# Patient Record
Sex: Female | Born: 1995
Health system: Southern US, Community
[De-identification: ages and names within clinical notes are randomized; demographics above are authoritative.]

## PROBLEM LIST (undated history)

## (undated) DIAGNOSIS — E876 Hypokalemia: Secondary | ICD-10-CM

## (undated) DIAGNOSIS — Z9114 Patient's other noncompliance with medication regimen: Secondary | ICD-10-CM

## (undated) DIAGNOSIS — D649 Anemia, unspecified: Secondary | ICD-10-CM

## (undated) DIAGNOSIS — F3181 Bipolar II disorder: Secondary | ICD-10-CM

## (undated) DIAGNOSIS — I509 Heart failure, unspecified: Secondary | ICD-10-CM

## (undated) DIAGNOSIS — E109 Type 1 diabetes mellitus without complications: Secondary | ICD-10-CM

## (undated) DIAGNOSIS — Z91148 Patient's other noncompliance with medication regimen for other reason: Secondary | ICD-10-CM

## (undated) DIAGNOSIS — N182 Chronic kidney disease, stage 2 (mild): Secondary | ICD-10-CM

## (undated) DIAGNOSIS — I5022 Chronic systolic (congestive) heart failure: Secondary | ICD-10-CM

## (undated) DIAGNOSIS — N189 Chronic kidney disease, unspecified: Secondary | ICD-10-CM

## (undated) DIAGNOSIS — N186 End stage renal disease: Secondary | ICD-10-CM

## (undated) DIAGNOSIS — G43909 Migraine, unspecified, not intractable, without status migrainosus: Secondary | ICD-10-CM

## (undated) DIAGNOSIS — E1110 Type 2 diabetes mellitus with ketoacidosis without coma: Secondary | ICD-10-CM

## (undated) DIAGNOSIS — O1490 Unspecified pre-eclampsia, unspecified trimester: Secondary | ICD-10-CM

## (undated) DIAGNOSIS — F419 Anxiety disorder, unspecified: Secondary | ICD-10-CM

## (undated) DIAGNOSIS — F32A Depression, unspecified: Secondary | ICD-10-CM

## (undated) DIAGNOSIS — I4581 Long QT syndrome: Secondary | ICD-10-CM

## (undated) DIAGNOSIS — E119 Type 2 diabetes mellitus without complications: Secondary | ICD-10-CM

## (undated) DIAGNOSIS — M62830 Muscle spasm of back: Secondary | ICD-10-CM

## (undated) DIAGNOSIS — B009 Herpesviral infection, unspecified: Secondary | ICD-10-CM

## (undated) DIAGNOSIS — N183 Chronic kidney disease, stage 3 unspecified: Secondary | ICD-10-CM

## (undated) DIAGNOSIS — D72829 Elevated white blood cell count, unspecified: Secondary | ICD-10-CM

## (undated) DIAGNOSIS — Z992 Dependence on renal dialysis: Secondary | ICD-10-CM

## (undated) HISTORY — PX: CARDIAC CATHETERIZATION: SHX172

## (undated) HISTORY — DX: Anemia, unspecified: D64.9

## (undated) HISTORY — PX: NO PAST SURGERIES: SHX2092

## (undated) HISTORY — PX: RENAL BIOPSY: SHX156

## (undated) HISTORY — PX: OTHER SURGICAL HISTORY: SHX169

---
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
## 2015-12-15 ENCOUNTER — Encounter (HOSPITAL_COMMUNITY): Payer: Self-pay | Admitting: Emergency Medicine

## 2015-12-15 ENCOUNTER — Ambulatory Visit (INDEPENDENT_AMBULATORY_CARE_PROVIDER_SITE_OTHER)
Admission: EM | Admit: 2015-12-15 | Discharge: 2015-12-15 | Disposition: A | Discharge status: Nursing Home (Includes ICF) | Payer: Managed Care, Other (non HMO) | Source: Home / Self Care | Attending: Family Medicine | Admitting: Family Medicine

## 2015-12-15 ENCOUNTER — Emergency Department (HOSPITAL_COMMUNITY)
Admission: EM | Admit: 2015-12-15 | Discharge: 2015-12-15 | Disposition: A | Discharge status: Home / Self Care | Payer: Managed Care, Other (non HMO) | Attending: Emergency Medicine | Admitting: Emergency Medicine

## 2015-12-15 ENCOUNTER — Encounter (HOSPITAL_COMMUNITY): Payer: Self-pay

## 2015-12-15 DIAGNOSIS — Z794 Long term (current) use of insulin: Secondary | ICD-10-CM | POA: Diagnosis not present

## 2015-12-15 DIAGNOSIS — E860 Dehydration: Secondary | ICD-10-CM | POA: Diagnosis not present

## 2015-12-15 DIAGNOSIS — F17200 Nicotine dependence, unspecified, uncomplicated: Secondary | ICD-10-CM | POA: Diagnosis not present

## 2015-12-15 DIAGNOSIS — Z79899 Other long term (current) drug therapy: Secondary | ICD-10-CM | POA: Insufficient documentation

## 2015-12-15 DIAGNOSIS — R739 Hyperglycemia, unspecified: Secondary | ICD-10-CM

## 2015-12-15 DIAGNOSIS — E1065 Type 1 diabetes mellitus with hyperglycemia: Secondary | ICD-10-CM

## 2015-12-15 DIAGNOSIS — R824 Acetonuria: Secondary | ICD-10-CM | POA: Diagnosis not present

## 2015-12-15 DIAGNOSIS — E1165 Type 2 diabetes mellitus with hyperglycemia: Secondary | ICD-10-CM | POA: Insufficient documentation

## 2015-12-15 DIAGNOSIS — E119 Type 2 diabetes mellitus without complications: Secondary | ICD-10-CM | POA: Diagnosis present

## 2015-12-15 LAB — GLUCOSE, CAPILLARY: Glucose-Capillary: 486 mg/dL — ABNORMAL HIGH (ref 65–99)

## 2015-12-15 LAB — CBG MONITORING, ED: GLUCOSE-CAPILLARY: 320 mg/dL — AB (ref 65–99)

## 2015-12-15 LAB — POCT URINALYSIS DIP (DEVICE)
Bilirubin Urine: NEGATIVE
GLUCOSE, UA: 500 mg/dL — AB
KETONES UR: 15 mg/dL — AB
Leukocytes, UA: NEGATIVE
Nitrite: NEGATIVE
PROTEIN: NEGATIVE mg/dL
Specific Gravity, Urine: 1.015 (ref 1.005–1.030)
UROBILINOGEN UA: 0.2 mg/dL (ref 0.0–1.0)
pH: 5.5 (ref 5.0–8.0)

## 2015-12-15 LAB — BASIC METABOLIC PANEL
Anion gap: 12 (ref 5–15)
BUN: 14 mg/dL (ref 6–20)
CALCIUM: 9.2 mg/dL (ref 8.9–10.3)
CO2: 23 mmol/L (ref 22–32)
CREATININE: 0.88 mg/dL (ref 0.44–1.00)
Chloride: 101 mmol/L (ref 101–111)
GFR calc Af Amer: >60 mL/min (ref >60)
GFR calc non Af Amer: >60 mL/min (ref >60)
Glucose, Bld: 352 mg/dL — ABNORMAL HIGH (ref 65–99)
Potassium: 4.3 mmol/L (ref 3.5–5.1)
SODIUM: 136 mmol/L (ref 135–145)

## 2015-12-15 LAB — POCT I-STAT, CHEM 8
BUN: 18 mg/dL (ref 6–20)
Calcium, Ion: 1.21 mmol/L (ref 1.12–1.23)
Chloride: 97 mmol/L — ABNORMAL LOW (ref 101–111)
Creatinine, Ser: 0.8 mg/dL (ref 0.44–1.00)
Glucose, Bld: 451 mg/dL — ABNORMAL HIGH (ref 65–99)
HCT: 45 % (ref 36.0–46.0)
HEMOGLOBIN: 15.3 g/dL — AB (ref 12.0–15.0)
Potassium: 4.2 mmol/L (ref 3.5–5.1)
SODIUM: 135 mmol/L (ref 135–145)
TCO2: 25 mmol/L (ref 0–100)

## 2015-12-15 MED ORDER — INSULIN LISPRO 100 UNIT/ML ~~LOC~~ SOLN: 10.0000 [IU] | Freq: Three times a day (TID) | SUBCUTANEOUS | Status: DC

## 2015-12-15 MED ORDER — SODIUM CHLORIDE 0.9 % IV BOLUS (SEPSIS)
1000.0000 mL | Freq: Once | INTRAVENOUS | Status: AC
  Administered 2015-12-15: 1000 mL via INTRAVENOUS

## 2015-12-15 NOTE — ED Provider Notes (Signed)
CSN: [id number]     Arrival date & time 12/15/15  1641 History   First MD Initiated Contact with Patient 12/15/15 1731     Chief Complaint  Patient presents with  . Hyperglycemia   (Consider location/radiation/quality/duration/timing/severity/associated sxs/prior Treatment) HPI Comments: 20 year old female that is a type I diabetic states she started running low on her medications 5 days ago. Since that time her blood sugars have been elevating. She has had polyuria polydipsia, dry mouth, chest burning, lightheadedness. She states she tried to call her PCPis located 2 hours away and have her insulin filled locally. There has been some problem and having them send a prescription to a local pharmacy.She presents stating that her last blood sugar was over 500 and on arrival blood sugar is 486.   Past Medical History  Diagnosis Date  . Diabetes mellitus without complication ([place])    History reviewed. No pertinent past surgical history. No family history on file. Social History  Substance Use Topics  . Smoking status: Light Tobacco Smoker  . Smokeless tobacco: Never Used  . Alcohol Use: No   OB History    No data available     Review of Systems  Constitutional: Positive for activity change. Negative for fever.  HENT: Negative.   Respiratory: Positive for shortness of breath.   Gastrointestinal: Negative for nausea, vomiting and abdominal pain.  Endocrine: Positive for polydipsia and polyuria.  Genitourinary: Negative.   Musculoskeletal: Negative.   Skin: Negative.   Neurological: Positive for dizziness and light-headedness. Negative for tremors, syncope and speech difficulty.  Psychiatric/Behavioral: Negative.     Allergies  Review of patient's allergies indicates no known allergies.  Home Medications   Prior to Admission medications   Medication Sig Start Date End Date Taking? Authorizing Provider  insulin glargine (LANTUS) 100 UNIT/ML injection Inject 30 Units into the  skin at bedtime.    Yes Historical Provider, MD  insulin lispro (HUMALOG) 100 UNIT/ML injection Inject 10-15 Units into the skin 3 (three) times daily before meals. Take 10 units at breakfast, 15 units at lunch & 15 units at dinner. Additional Doses per Sliding Scale: If Blood Sugar is 200 above Take 1 unit, above 250 Take 2 units, above 300 Take 3 units, above 350 Take 4 units, above 400 Take 5 units & above 450 Take 6 units.   Yes Historical Provider, MD  fluconazole (DIFLUCAN) 100 MG tablet Take 1 tablet (100 mg total) by mouth daily. 12/23/14   Robbie Lis, MD  MedroxyPROGESTERone Acetate (DEPO-PROVERA IM) Inject 1 Units into the muscle every 3 (three) months.    Historical Provider, MD  metoCLOPramide (REGLAN) 5 MG tablet Take 1 tablet (5 mg total) by mouth every 6 (six) hours as needed for nausea, vomiting or refractory nausea / vomiting. 12/23/14   Robbie Lis, MD  ondansetron (ZOFRAN ODT) 4 MG disintegrating tablet Take 1 tablet (4 mg total) by mouth every 8 (eight) hours as needed for nausea. 12/23/14   Robbie Lis, MD  sucralfate (CARAFATE) 1 GM/10ML suspension Take 10 mLs (1 g total) by mouth 4 (four) times daily -  with meals and at bedtime. 12/23/14   Robbie Lis, MD   Meds Ordered and Administered this Visit  Medications - No data to display  BP 136/97 mmHg  Pulse 104  Temp(Src) 98.4 F (36.9 C) (Oral)  Resp 16  Ht 5' (1.524 m)  Wt 145 lb (65.772 kg)  BMI 28.32 kg/m2  SpO2 100% No data found.  Physical Exam  Constitutional: She is oriented to person, place, and time. She appears well-developed and well-nourished. No distress.  HENT:  Head: Normocephalic and atraumatic.  Eyes: EOM are normal.  Neck: Normal range of motion. Neck supple.  Cardiovascular: Normal rate and regular rhythm.   Pulmonary/Chest: Effort normal and breath sounds normal. No respiratory distress.  Abdominal: Soft. There is no tenderness.  Musculoskeletal: She exhibits no edema.  Neurological: She  is alert and oriented to person, place, and time. She exhibits normal muscle tone.  Skin: Skin is warm and dry.  Psychiatric: She has a normal mood and affect.  Nursing note and vitals reviewed.   ED Course  Procedures (including critical care time)  Labs Review Labs Reviewed  GLUCOSE, CAPILLARY - Abnormal; Notable for the following:    Glucose-Capillary 486 (*)    All other components within normal limits  POCT URINALYSIS DIP (DEVICE) - Abnormal; Notable for the following:    Glucose, UA 500 (*)    Ketones, ur 15 (*)    Hgb urine dipstick MODERATE (*)    All other components within normal limits  POCT I-STAT, CHEM 8 - Abnormal; Notable for the following:    Chloride 97 (*)    Glucose, Bld 451 (*)    Hemoglobin 15.3 (*)    All other components within normal limits    Imaging Review No results found.   Visual Acuity Review  Right Eye Distance:   Left Eye Distance:   Bilateral Distance:    Right Eye Near:   Left Eye Near:    Bilateral Near:         MDM   1. Hyperglycemia due to type 1 diabetes mellitus ([place])   2. Ketonuria   3. Dehydration, mild    Transfer to the ED for management of hyperglycemia and mild dehydration. Out of sufficient insulin fo r5 days.  Pt is stable and will be transferred by friend private vehicle. Hx of DKA  Verbal and written discharge instructions have been reviewed and given to the patient  by the provider to include medications and other care measures.   Janne Napoleon, NP 12/15/15 Vernelle Emerald  Janne Napoleon, NP 12/15/15 1823  Janne Napoleon, NP 12/15/15 [phone]

## 2015-12-15 NOTE — Discharge Instructions (Signed)
Go directly to the emergency department now for management of hyperglycemia and insulin.

## 2015-12-15 NOTE — ED Notes (Addendum)
Patient presents with elevated Blood sugar 486, she is having dizzy spells and she complains of chest burning, and dry mouth Patient has taken 25 units of Lantus last night 12/14/2015, and 10 units of Humalog this morning 12/15/2015. No acute distress

## 2015-12-15 NOTE — ED Notes (Signed)
CBG was 320, RN notified

## 2015-12-15 NOTE — Discharge Instructions (Signed)
Please read and follow all provided instructions.  Your diagnoses today include:  1. Hyperglycemia    Tests performed today include:  Vital signs. See below for your results today.   Medications prescribed:   Take as prescribed  Home care instructions:  Follow any educational materials contained in this packet.  Follow-up instructions: Please follow-up with your primary care provider in the next 48 hours for further evaluation of symptoms and treatment   Return instructions:   Please return to the Emergency Department if you do not get better, if you get worse, or new symptoms OR  - Fever (temperature greater than 101.69F)  - Bleeding that does not stop with holding pressure to the area    -Severe pain (please note that you may be more sore the day after your accident)  - Chest Pain  - Difficulty breathing  - Severe nausea or vomiting  - Inability to tolerate food and liquids  - Passing out  - Skin becoming red around your wounds  - Change in mental status (confusion or lethargy)  - New numbness or weakness     Please return if you have any other emergent concerns.  Additional Information:  Your vital signs today were: BP 121/91 mmHg   Pulse 96   Temp(Src) 98.3 F (36.8 C) (Oral)   Resp 18   SpO2 99% If your blood pressure (BP) was elevated above 135/85 this visit, please have this repeated by your doctor within one month. ---------------

## 2015-12-15 NOTE — ED Provider Notes (Signed)
CSN: [id number]     Arrival date & time 12/15/15  1835 History   First MD Initiated Contact with Patient 12/15/15 2131     Chief Complaint  Patient presents with  . Hyperglycemia   (Consider location/radiation/quality/duration/timing/severity/associated sxs/prior Treatment) HPI 20 y.o. female with a hx of DM Type I, presents to the Emergency Department today from Urgent Care due to running out of her humalog today. Noted dizziness earlier in the day, but none now. PT states that she ran out of Humalog due to her PCP being from out of town. Notes trouble with prescriptions being called in to pharmacy. No N/V/D. No fevers. No CP/SOPB/ABD pain. No headaches. No vision changes. Notes increase in thirst. No other symptoms noted    Past Medical History  Diagnosis Date  . Diabetes mellitus without complication ([place])    History reviewed. No pertinent past surgical history. History reviewed. No pertinent family history. Social History  Substance Use Topics  . Smoking status: Light Tobacco Smoker  . Smokeless tobacco: Never Used  . Alcohol Use: No   OB History    No data available     Review of Systems ROS reviewed and all are negative for acute change except as noted in the HPI.  Allergies  Review of patient's allergies indicates no known allergies.  Home Medications   Prior to Admission medications   Medication Sig Start Date End Date Taking? Authorizing Provider  fluconazole (DIFLUCAN) 100 MG tablet Take 1 tablet (100 mg total) by mouth daily. 12/23/14   Robbie Lis, MD  insulin glargine (LANTUS) 100 UNIT/ML injection Inject 30 Units into the skin at bedtime.     Historical Provider, MD  insulin lispro (HUMALOG) 100 UNIT/ML injection Inject 10-15 Units into the skin 3 (three) times daily before meals. Take 10 units at breakfast, 15 units at lunch & 15 units at dinner. Additional Doses per Sliding Scale: If Blood Sugar is 200 above Take 1 unit, above 250 Take 2 units, above 300 Take 3  units, above 350 Take 4 units, above 400 Take 5 units & above 450 Take 6 units.    Historical Provider, MD  MedroxyPROGESTERone Acetate (DEPO-PROVERA IM) Inject 1 Units into the muscle every 3 (three) months.    Historical Provider, MD  metoCLOPramide (REGLAN) 5 MG tablet Take 1 tablet (5 mg total) by mouth every 6 (six) hours as needed for nausea, vomiting or refractory nausea / vomiting. 12/23/14   Robbie Lis, MD  ondansetron (ZOFRAN ODT) 4 MG disintegrating tablet Take 1 tablet (4 mg total) by mouth every 8 (eight) hours as needed for nausea. 12/23/14   Robbie Lis, MD  sucralfate (CARAFATE) 1 GM/10ML suspension Take 10 mLs (1 g total) by mouth 4 (four) times daily -  with meals and at bedtime. 12/23/14   Robbie Lis, MD   BP 125/90 mmHg  Pulse 100  Temp(Src) 98.3 F (36.8 C) (Oral)  Resp 18  SpO2 97%   Physical Exam  Constitutional: She is oriented to person, place, and time. She appears well-developed and well-nourished.  HENT:  Head: Normocephalic and atraumatic.  Eyes: EOM are normal. Pupils are equal, round, and reactive to light.  Neck: Normal range of motion. Neck supple. No tracheal deviation present.  Cardiovascular: Normal rate, regular rhythm, normal heart sounds and intact distal pulses.   No murmur heard. Pulmonary/Chest: Effort normal and breath sounds normal. No respiratory distress. She has no wheezes. She has no rales. She exhibits no tenderness.  Abdominal: Soft.  Musculoskeletal: Normal range of motion.  Neurological: She is alert and oriented to person, place, and time.  Skin: Skin is warm and dry.  Psychiatric: She has a normal mood and affect. Her behavior is normal. Thought content normal.  Nursing note and vitals reviewed.   ED Course  Procedures (including critical care time) Labs Review Labs Reviewed  CBG MONITORING, ED - Abnormal; Notable for the following:    Glucose-Capillary 320 (*)    All other components within normal limits   Imaging  Review No results found. I have personally reviewed and evaluated these images and lab results as part of my medical decision-making.   EKG Interpretation None      MDM  I have reviewed and evaluated the relevant laboratory values.  I have reviewed the relevant previous healthcare records. I obtained HPI from historian. Patient discussed with supervising physician  ED Course:  Assessment: Pt is a 73yF with hx DM Type I who presents due to running out of her Humalog today. On exam, pt in NAD. Nontoxic/nonseptic appearing. VSS. Afebrile. Lungs CTA. Heart RRR. Abdomen nontender soft. CBG 320. BMP unremarkable. No gap. Given NS Bolus in ED. Plan is to [place] with Insulin Rx and follow up to PCP. At time of discharge, Patient is in no acute distress. Vital Signs are stable. Patient is able to ambulate. Patient able to tolerate PO.    Disposition/Plan:  DC Home Additional Verbal discharge instructions given and discussed with patient. Pt Instructed to f/u with PCP in the next 48 hours for evaluation and treatment of symptoms. Return precautions given Pt acknowledges and agrees with plan  Supervising Physician Davonna Belling, MD   Final diagnoses:  Hyperglycemia       Shary Decamp, PA-C 12/15/15 2243  Davonna Belling, MD 12/16/15 [phone]

## 2015-12-15 NOTE — ED Notes (Signed)
Pt had some labs at [facility]

## 2015-12-15 NOTE — ED Notes (Signed)
CBG 298 

## 2015-12-15 NOTE — ED Notes (Signed)
Pt sts ran out of humalog insulin today and has hyperglycemia today; pt sts is taking her lantus

## 2015-12-16 ENCOUNTER — Telehealth: Payer: Self-pay | Admitting: *Deleted

## 2015-12-16 LAB — CBG MONITORING, ED: Glucose-Capillary: 298 mg/dL — ABNORMAL HIGH (ref 65–99)

## 2015-12-16 NOTE — Telephone Encounter (Signed)
Pharmacy called related to Rx: insulin lispro (HUMALOG) 100 UNIT/ML injection  .Marland KitchenMarland KitchenEDCM clarified with EDP to change Rx to: quick pen as the pt is more familiar with this.

## 2015-12-16 NOTE — Telephone Encounter (Signed)
Kathy Lewis J. Clydene Laming, RN, [place], [place] [phone] ERCM set up appointment with Selina Cooley, NP of [place] on 12/17/15 @ 1:30.  Spoke with pt at bedside and provided brochure with directions and phone number highlighted.  Pt verbalizes understanding of keeping appointment.

## 2017-10-14 ENCOUNTER — Encounter (HOSPITAL_BASED_OUTPATIENT_CLINIC_OR_DEPARTMENT_OTHER): Payer: Self-pay | Admitting: *Deleted

## 2017-10-14 ENCOUNTER — Emergency Department (HOSPITAL_BASED_OUTPATIENT_CLINIC_OR_DEPARTMENT_OTHER): Payer: Managed Care, Other (non HMO)

## 2017-10-14 ENCOUNTER — Other Ambulatory Visit: Payer: Self-pay

## 2017-10-14 ENCOUNTER — Emergency Department (HOSPITAL_BASED_OUTPATIENT_CLINIC_OR_DEPARTMENT_OTHER)
Admission: EM | Admit: 2017-10-14 | Discharge: 2017-10-15 | Disposition: A | Discharge status: Home / Self Care | Payer: Managed Care, Other (non HMO) | Attending: Emergency Medicine | Admitting: Emergency Medicine

## 2017-10-14 DIAGNOSIS — F17200 Nicotine dependence, unspecified, uncomplicated: Secondary | ICD-10-CM | POA: Diagnosis not present

## 2017-10-14 DIAGNOSIS — Z794 Long term (current) use of insulin: Secondary | ICD-10-CM | POA: Diagnosis not present

## 2017-10-14 DIAGNOSIS — N10 Acute tubulo-interstitial nephritis: Secondary | ICD-10-CM

## 2017-10-14 DIAGNOSIS — E109 Type 1 diabetes mellitus without complications: Secondary | ICD-10-CM | POA: Diagnosis not present

## 2017-10-14 DIAGNOSIS — R1032 Left lower quadrant pain: Secondary | ICD-10-CM | POA: Diagnosis present

## 2017-10-14 LAB — URINALYSIS, ROUTINE W REFLEX MICROSCOPIC
Bilirubin Urine: NEGATIVE
Glucose, UA: >=500 mg/dL — AB
KETONES UR: 15 mg/dL — AB
NITRITE: NEGATIVE
PROTEIN: 100 mg/dL — AB
Specific Gravity, Urine: 1.02 (ref 1.005–1.030)
pH: 6 (ref 5.0–8.0)

## 2017-10-14 LAB — URINALYSIS, MICROSCOPIC (REFLEX)

## 2017-10-14 LAB — PREGNANCY, URINE: PREG TEST UR: NEGATIVE

## 2017-10-14 MED ORDER — SODIUM CHLORIDE 0.9 % IV SOLN
1.0000 g | Freq: Once | INTRAVENOUS | Status: AC
  Administered 2017-10-15: 1 g via INTRAVENOUS
  Filled 2017-10-14: qty 10

## 2017-10-14 MED ORDER — FENTANYL CITRATE (PF) 100 MCG/2ML IJ SOLN
50.0000 ug | Freq: Once | INTRAMUSCULAR | Status: AC
  Administered 2017-10-15: 50 ug via INTRAVENOUS
  Filled 2017-10-14: qty 2

## 2017-10-14 MED ORDER — ONDANSETRON HCL 4 MG/2ML IJ SOLN
4.0000 mg | Freq: Once | INTRAMUSCULAR | Status: AC
  Administered 2017-10-15: 4 mg via INTRAVENOUS
  Filled 2017-10-14: qty 2

## 2017-10-14 NOTE — ED Provider Notes (Signed)
[facility] EMERGENCY DEPARTMENT Provider Note   CSN: [id number] Arrival date & time: 10/14/17  2056     History   Chief Complaint Chief Complaint  Patient presents with  . Flank Pain    HPI Kathy Lewis is a 22 y.o. female.  The history is provided by the patient.  Flank Pain  This is a new problem. The current episode started yesterday. The problem occurs constantly. The problem has been rapidly worsening. Pertinent negatives include no abdominal pain and no shortness of breath. Exacerbated by: palpation. Nothing relieves the symptoms. She has tried nothing for the symptoms.   Patient with history of diabetes presents with left flank pain for 1 day.  She reports the pain is sharp and is worsening.  She reports she feels uncomfortable.  She reports her urine is foul-smelling, but denies any dysuria.  No lower abdominal tenderness.  Past Medical History:  Diagnosis Date  . Diabetes mellitus without complication [facility])     Patient Active Problem List   Diagnosis Date Noted  . Hypokalemia   . DKA (diabetic ketoacidoses) ([place]) 12/18/2014  . Leukocytosis 12/18/2014  . Noncompliance with medications 12/18/2014  . Type 1 diabetes mellitus with ketoacidosis, uncontrolled ([place]) 12/18/2014  . Nausea and vomiting 12/18/2014    History reviewed. No pertinent surgical history.  OB History    No data available       Home Medications    Prior to Admission medications   Medication Sig Start Date End Date Taking? Authorizing Provider  insulin glargine (LANTUS) 100 UNIT/ML injection Inject 25 Units into the skin at bedtime.    Yes [provider]  insulin lispro (HUMALOG) 100 UNIT/ML injection Inject 0.1-0.15 mLs (10-15 Units total) into the skin 3 (three) times daily before meals. Take 10 units at breakfast, 15 units at lunch & 15 units at dinner. Additional Doses per Sliding Scale: If Blood Sugar is 200 above Take 1 unit, above 250 Take 2 units, above 300 Take  3 units, above 350 Take 4 units, above 400 Take 5 units & above 450 Take 6 units. 12/15/15  Yes Shary Decamp, PA-C  insulin lispro (HUMALOG) 100 UNIT/ML injection Inject 1-15 Units into the skin 3 (three) times daily before meals.   Yes [provider]  fluconazole (DIFLUCAN) 100 MG tablet Take 1 tablet (100 mg total) by mouth daily. Patient not taking: Reported on 12/15/2015 12/23/14   Robbie Lis, MD  metoCLOPramide (REGLAN) 5 MG tablet Take 1 tablet (5 mg total) by mouth every 6 (six) hours as needed for nausea, vomiting or refractory nausea / vomiting. Patient not taking: Reported on 12/15/2015 12/23/14   Robbie Lis, MD  ondansetron (ZOFRAN ODT) 4 MG disintegrating tablet Take 1 tablet (4 mg total) by mouth every 8 (eight) hours as needed for nausea. Patient not taking: Reported on 12/15/2015 12/23/14   Robbie Lis, MD  sucralfate (CARAFATE) 1 GM/10ML suspension Take 10 mLs (1 g total) by mouth 4 (four) times daily -  with meals and at bedtime. Patient not taking: Reported on 12/15/2015 12/23/14   Robbie Lis, MD    Family History No family history on file.  Social History Social History   Tobacco Use  . Smoking status: Light Tobacco Smoker  . Smokeless tobacco: Never Used  Substance Use Topics  . Alcohol use: No  . Drug use: No     Allergies   Patient has no known allergies.   Review of Systems Review of  Systems  Constitutional: Negative for fever.  Respiratory: Negative for shortness of breath.   Gastrointestinal: Negative for abdominal pain.  Genitourinary: Positive for flank pain. Negative for vaginal bleeding.  All other systems reviewed and are negative.    Physical Exam Updated Vital Signs BP (!) 138/93 (BP Location: Left Arm)   Pulse 90   Temp 98.6 F (37 C)   Resp 16   Ht 1.524 m (5')   Wt 63.5 kg (140 lb)   LMP 09/30/2017   SpO2 100%   BMI 27.34 kg/m   Physical Exam  CONSTITUTIONAL: Well developed/well nourished, uncomfortable  appearing, smells ketotic HEAD: Normocephalic/atraumatic EYES: EOMI/PERRL ENMT: Mucous membranes dry NECK: supple no meningeal signs SPINE/BACK:entire spine nontender CV: S1/S2 noted, no murmurs/rubs/gallops noted LUNGS: Lungs are clear to auscultation bilaterally, no apparent distress ABDOMEN: soft, nontender, no rebound or guarding, bowel sounds noted throughout abdomen GU: Left Cva tenderness NEURO: Pt is awake/alert/appropriate, moves all extremitiesx4.  No facial droop.   EXTREMITIES: pulses normal/equal, full ROM SKIN: warm, color normal PSYCH: no abnormalities of mood noted, alert and oriented to situation  ED Treatments / Results  Labs (all labs ordered are listed, but only abnormal results are displayed) Labs Reviewed  URINALYSIS, ROUTINE W REFLEX MICROSCOPIC - Abnormal; Notable for the following components:      Result Value   APPearance CLOUDY (*)    Glucose, UA >=500 (*)    Hgb urine dipstick LARGE (*)    Ketones, ur 15 (*)    Protein, ur 100 (*)    Leukocytes, UA TRACE (*)    All other components within normal limits  URINALYSIS, MICROSCOPIC (REFLEX) - Abnormal; Notable for the following components:   Bacteria, UA MANY (*)    Squamous Epithelial / LPF 0-5 (*)    All other components within normal limits  BASIC METABOLIC PANEL - Abnormal; Notable for the following components:   Chloride 100 (*)    Glucose, Bld 226 (*)    Creatinine, Ser 1.12 (*)    All other components within normal limits  CBC WITH DIFFERENTIAL/PLATELET - Abnormal; Notable for the following components:   RDW 11.2 (*)    All other components within normal limits  URINE CULTURE  PREGNANCY, URINE    EKG  EKG Interpretation None       Radiology Ct Renal Stone Study  Result Date: 10/14/2017 CLINICAL DATA:  Left flank pain since Friday night.  History of UTI. EXAM: CT ABDOMEN AND PELVIS WITHOUT CONTRAST TECHNIQUE: Multidetector CT imaging of the abdomen and pelvis was performed following  the standard protocol without IV contrast. COMPARISON:  None. FINDINGS: Lower chest: No acute abnormality. Hepatobiliary: No focal liver abnormality is seen. No gallstones, gallbladder wall thickening, or biliary dilatation. Pancreas: Unremarkable. No pancreatic ductal dilatation or surrounding inflammatory changes. Spleen: Normal in size without focal abnormality. Adrenals/Urinary Tract: Adrenal glands appear normal. Kidneys are unremarkable without mass, stone or hydronephrosis. No perinephric fluid seen. No ureteral or bladder calculi identified. Bladder appears normal. Stomach/Bowel: No dilated large or small bowel loops identified. Appendix appears normal, incompletely visualized. Stomach appears normal. Moderate amount of stool and gas throughout the nondistended colon. Vascular/Lymphatic: No significant vascular findings are present. No enlarged abdominal or pelvic lymph nodes. Reproductive: Uterus and bilateral adnexa are unremarkable, although poorly seen due to overlapping fluid-filled small bowel loops in the pelvis. Other: Trace free fluid in the lower pelvis is likely physiologic in nature. No significant free fluid within the abdomen or pelvis. No abscess collection  identified. No free intraperitoneal air. Musculoskeletal: No acute or significant osseous findings. Superficial soft tissues are unremarkable. IMPRESSION: Normal noncontrast CT of the abdomen and pelvis. No acute findings. No renal or ureteral calculi identified. Appendix appears normal. No dilated bowel. Adnexal structures are difficult to definitively characterize due to overlapping fluid-filled bowel loops in the pelvis. If symptoms could be adnexal, consider pelvic ultrasound for further characterization. Electronically Signed   By: Franki Cabot M.D.   On: 10/14/2017 23:52    Procedures Procedures (including critical care time)  Medications Ordered in ED Medications  cefTRIAXone (ROCEPHIN) 1 g in sodium chloride 0.9 % 100 mL  IVPB (1 g Intravenous New Bag/Given 10/15/17 0028)  fentaNYL (SUBLIMAZE) injection 50 mcg (50 mcg Intravenous Given 10/15/17 0028)  ondansetron (ZOFRAN) injection 4 mg (4 mg Intravenous Given 10/15/17 0028)     Initial Impression / Assessment and Plan / ED Course  I have reviewed the triage vital signs and the nursing notes.  Pertinent labs esults that were available during my care of the patient were reviewed by me and considered in my medical decision making (see chart for details). Insert narcotics    1:12 AM Patient with left flank pain, my initial concern was for ureteral stone with UTI.  CT imaging performed that did not reveal any signs of this kidney stone.  Urine is consistent with an infection.  She is now improved with IV fluids and pain medicines.  She is not septic appearing. No signs of DKA.  Will start her on Keflex. We discussed strict return precautions. Pulse ox reported at 87%, but when I was in the room pulse ox is greater than 95% on room air.  Suspect 87% was error  Final Clinical Impressions(s) / ED Diagnoses   Final diagnoses:  Pyelonephritis, acute    ED Discharge Orders        Ordered    HYDROcodone-acetaminophen (NORCO/VICODIN) 5-325 MG tablet  Every 6 hours PRN     10/15/17 0112    cephALEXin (KEFLEX) 500 MG capsule  4 times daily     10/15/17 0112    ondansetron (ZOFRAN ODT) 4 MG disintegrating tablet     10/15/17 0112       Ripley Fraise, MD 10/15/17 [phone]

## 2017-10-14 NOTE — ED Triage Notes (Signed)
Left flank pain x 1 day. Pt is type diabetic. Concerned for UTI

## 2017-10-15 LAB — CBC WITH DIFFERENTIAL/PLATELET
BASOS ABS: 0 10*3/uL (ref 0.0–0.1)
BASOS PCT: 0 %
EOS PCT: 1 %
Eosinophils Absolute: 0 10*3/uL (ref 0.0–0.7)
HCT: 37.6 % (ref 36.0–46.0)
Hemoglobin: 13.1 g/dL (ref 12.0–15.0)
Lymphocytes Relative: 31 %
Lymphs Abs: 1.9 10*3/uL (ref 0.7–4.0)
MCH: 31.8 pg (ref 26.0–34.0)
MCHC: 34.8 g/dL (ref 30.0–36.0)
MCV: 91.3 fL (ref 78.0–100.0)
MONO ABS: 0.4 10*3/uL (ref 0.1–1.0)
MONOS PCT: 6 %
Neutro Abs: 4 10*3/uL (ref 1.7–7.7)
Neutrophils Relative %: 62 %
PLATELETS: 323 10*3/uL (ref 150–400)
RBC: 4.12 MIL/uL (ref 3.87–5.11)
RDW: 11.2 % — AB (ref 11.5–15.5)
WBC: 6.4 10*3/uL (ref 4.0–10.5)

## 2017-10-15 LAB — BASIC METABOLIC PANEL
Anion gap: 10 (ref 5–15)
BUN: 17 mg/dL (ref 6–20)
CALCIUM: 9.3 mg/dL (ref 8.9–10.3)
CO2: 28 mmol/L (ref 22–32)
CREATININE: 1.12 mg/dL — AB (ref 0.44–1.00)
Chloride: 100 mmol/L — ABNORMAL LOW (ref 101–111)
GFR calc Af Amer: >60 mL/min (ref >60)
GLUCOSE: 226 mg/dL — AB (ref 65–99)
Potassium: 3.7 mmol/L (ref 3.5–5.1)
Sodium: 138 mmol/L (ref 135–145)

## 2017-10-15 MED ORDER — ONDANSETRON HCL 4 MG/2ML IJ SOLN
4.0000 mg | Freq: Once | INTRAMUSCULAR | Status: AC
  Administered 2017-10-15: 4 mg via INTRAVENOUS
  Filled 2017-10-15: qty 2

## 2017-10-15 MED ORDER — FENTANYL CITRATE (PF) 100 MCG/2ML IJ SOLN
50.0000 ug | Freq: Once | INTRAMUSCULAR | Status: AC
  Administered 2017-10-15: 50 ug via INTRAVENOUS
  Filled 2017-10-15: qty 2

## 2017-10-15 MED ORDER — HYDROCODONE-ACETAMINOPHEN 5-325 MG PO TABS: 1.0000 | ORAL_TABLET | Freq: Four times a day (QID) | ORAL | 0 refills | Status: DC | PRN

## 2017-10-15 MED ORDER — ONDANSETRON 4 MG PO TBDP: ORAL_TABLET | ORAL | 0 refills | Status: DC

## 2017-10-15 MED ORDER — CEPHALEXIN 500 MG PO CAPS: 500.0000 mg | ORAL_CAPSULE | Freq: Four times a day (QID) | ORAL | 0 refills | Status: DC

## 2017-10-15 NOTE — ED Notes (Signed)
Intermittent low SPO2 signal, returns to 98% on RA, O2 [place] at BS prn.

## 2017-10-15 NOTE — ED Notes (Signed)
Alert, NAD, calm, interactive, resps e/u, speaking in clear complete sentences, no dyspnea noted, skin W&D, VSS, here for LLQ pain radiating to L flank pain, (denies: fever, sob, NVD, bleeding, dizziness or visual changes). H/o DM. EDP into room to update pt on results and plan. Family in w/r.

## 2017-10-15 NOTE — ED Notes (Signed)
VSS. C/o pain and nausea, clutching emesis bag, meds given, will continue to monitor/ reassess.

## 2017-10-16 ENCOUNTER — Emergency Department (HOSPITAL_BASED_OUTPATIENT_CLINIC_OR_DEPARTMENT_OTHER)
Admission: EM | Admit: 2017-10-16 | Discharge: 2017-10-16 | Disposition: A | Discharge status: Home / Self Care | Payer: Managed Care, Other (non HMO) | Attending: Emergency Medicine | Admitting: Emergency Medicine

## 2017-10-16 ENCOUNTER — Encounter (HOSPITAL_BASED_OUTPATIENT_CLINIC_OR_DEPARTMENT_OTHER): Payer: Self-pay | Admitting: Emergency Medicine

## 2017-10-16 ENCOUNTER — Other Ambulatory Visit: Payer: Self-pay

## 2017-10-16 DIAGNOSIS — R112 Nausea with vomiting, unspecified: Secondary | ICD-10-CM | POA: Insufficient documentation

## 2017-10-16 DIAGNOSIS — E860 Dehydration: Secondary | ICD-10-CM

## 2017-10-16 DIAGNOSIS — N12 Tubulo-interstitial nephritis, not specified as acute or chronic: Secondary | ICD-10-CM | POA: Insufficient documentation

## 2017-10-16 DIAGNOSIS — Z79899 Other long term (current) drug therapy: Secondary | ICD-10-CM | POA: Diagnosis not present

## 2017-10-16 DIAGNOSIS — E1065 Type 1 diabetes mellitus with hyperglycemia: Secondary | ICD-10-CM | POA: Diagnosis not present

## 2017-10-16 DIAGNOSIS — R739 Hyperglycemia, unspecified: Secondary | ICD-10-CM

## 2017-10-16 DIAGNOSIS — R1110 Vomiting, unspecified: Secondary | ICD-10-CM | POA: Diagnosis present

## 2017-10-16 LAB — URINALYSIS, MICROSCOPIC (REFLEX)

## 2017-10-16 LAB — BASIC METABOLIC PANEL
ANION GAP: 11 (ref 5–15)
BUN: 15 mg/dL (ref 6–20)
CO2: 23 mmol/L (ref 22–32)
CREATININE: 1.15 mg/dL — AB (ref 0.44–1.00)
Calcium: 8.6 mg/dL — ABNORMAL LOW (ref 8.9–10.3)
Chloride: 101 mmol/L (ref 101–111)
Glucose, Bld: 369 mg/dL — ABNORMAL HIGH (ref 65–99)
Potassium: 4 mmol/L (ref 3.5–5.1)
SODIUM: 135 mmol/L (ref 135–145)

## 2017-10-16 LAB — CBC WITH DIFFERENTIAL/PLATELET
BASOS PCT: 0 %
Basophils Absolute: 0 10*3/uL (ref 0.0–0.1)
EOS ABS: 0 10*3/uL (ref 0.0–0.7)
Eosinophils Relative: 1 %
HCT: 33.8 % — ABNORMAL LOW (ref 36.0–46.0)
Hemoglobin: 11.7 g/dL — ABNORMAL LOW (ref 12.0–15.0)
Lymphocytes Relative: 29 %
Lymphs Abs: 1.8 10*3/uL (ref 0.7–4.0)
MCH: 32.5 pg (ref 26.0–34.0)
MCHC: 34.6 g/dL (ref 30.0–36.0)
MCV: 93.9 fL (ref 78.0–100.0)
MONO ABS: 0.4 10*3/uL (ref 0.1–1.0)
MONOS PCT: 6 %
NEUTROS ABS: 4 10*3/uL (ref 1.7–7.7)
NEUTROS PCT: 64 %
Platelets: 276 10*3/uL (ref 150–400)
RBC: 3.6 MIL/uL — ABNORMAL LOW (ref 3.87–5.11)
RDW: 11.1 % — AB (ref 11.5–15.5)
WBC: 6.2 10*3/uL (ref 4.0–10.5)

## 2017-10-16 LAB — URINALYSIS, ROUTINE W REFLEX MICROSCOPIC
BILIRUBIN URINE: NEGATIVE
Glucose, UA: >=500 mg/dL — AB
KETONES UR: 40 mg/dL — AB
LEUKOCYTES UA: NEGATIVE
NITRITE: NEGATIVE
PROTEIN: 30 mg/dL — AB
Specific Gravity, Urine: 1.01 (ref 1.005–1.030)
pH: 5.5 (ref 5.0–8.0)

## 2017-10-16 LAB — CBG MONITORING, ED
Glucose-Capillary: 365 mg/dL — ABNORMAL HIGH (ref 65–99)
Glucose-Capillary: 218 mg/dL — ABNORMAL HIGH (ref 65–99)

## 2017-10-16 MED ORDER — SODIUM CHLORIDE 0.9 % IV BOLUS (SEPSIS)
1000.0000 mL | Freq: Once | INTRAVENOUS | Status: AC
  Administered 2017-10-16: 1000 mL via INTRAVENOUS

## 2017-10-16 MED ORDER — SODIUM CHLORIDE 0.9 % IV SOLN
1.0000 g | Freq: Once | INTRAVENOUS | Status: AC
  Administered 2017-10-16: 1 g via INTRAVENOUS
  Filled 2017-10-16: qty 10

## 2017-10-16 MED ORDER — ONDANSETRON HCL 4 MG/2ML IJ SOLN
4.0000 mg | Freq: Once | INTRAMUSCULAR | Status: AC
  Administered 2017-10-16: 4 mg via INTRAVENOUS
  Filled 2017-10-16: qty 2

## 2017-10-16 MED ORDER — PROMETHAZINE HCL 25 MG/ML IJ SOLN
12.5000 mg | Freq: Once | INTRAMUSCULAR | Status: AC
  Administered 2017-10-16: 12.5 mg via INTRAVENOUS

## 2017-10-16 MED ORDER — PROMETHAZINE HCL 25 MG PO TABS: 25.0000 mg | ORAL_TABLET | Freq: Four times a day (QID) | ORAL | 0 refills | Status: DC | PRN

## 2017-10-16 MED ORDER — PROMETHAZINE HCL 25 MG/ML IJ SOLN
INTRAMUSCULAR | Status: AC
  Filled 2017-10-16: qty 1

## 2017-10-16 NOTE — ED Triage Notes (Signed)
States was here yesterday and dx with a kidney infection . Having n/v and BS 411 at 0320, took 10 units of Regular insulin .

## 2017-10-16 NOTE — ED Provider Notes (Signed)
[facility] Provider Note   CSN: [id number] Arrival date & time: 10/16/17  0335     History   Chief Complaint Chief Complaint  Patient presents with  . Emesis    HPI Kathy [facility] is a 22 y.o. female.  HPI  This is a 22 year old female with a history of diabetes who presents with vomiting.  Patient was seen and evaluated yesterday and diagnosed with pyelonephritis.  She states that since being discharged she has been unable to tolerate her medications.  She reports multiple episodes of vomiting.  She also reports that her sugars have been "in the 400s."  She took her blood sugar prior to arrival and it was greater than 400.  She took 10 units of insulin.  She does have a history of DKA.  She reports that her left flank pain had improved.  Denies any fevers or new symptoms.  Past Medical History:  Diagnosis Date  . Diabetes mellitus without complication [facility])     Patient Active Problem List   Diagnosis Date Noted  . Hypokalemia   . DKA (diabetic ketoacidoses) ([place]) 12/18/2014  . Leukocytosis 12/18/2014  . Noncompliance with medications 12/18/2014  . Type 1 diabetes mellitus with ketoacidosis, uncontrolled ([place]) 12/18/2014  . Nausea and vomiting 12/18/2014    History reviewed. No pertinent surgical history.  OB History    No data available       Home Medications    Prior to Admission medications   Medication Sig Start Date End Date Taking? Authorizing Provider  cephALEXin (KEFLEX) 500 MG capsule Take 1 capsule (500 mg total) by mouth 4 (four) times daily. 10/15/17   Ripley Fraise, MD  fluconazole (DIFLUCAN) 100 MG tablet Take 1 tablet (100 mg total) by mouth daily. Patient not taking: Reported on 12/15/2015 12/23/14   Robbie Lis, MD  HYDROcodone-acetaminophen (NORCO/VICODIN) 5-325 MG tablet Take 1 tablet by mouth every 6 (six) hours as needed for severe pain. 10/15/17   Ripley Fraise, MD  insulin glargine (LANTUS) 100 UNIT/ML  injection Inject 25 Units into the skin at bedtime.     [provider]  insulin lispro (HUMALOG) 100 UNIT/ML injection Inject 0.1-0.15 mLs (10-15 Units total) into the skin 3 (three) times daily before meals. Take 10 units at breakfast, 15 units at lunch & 15 units at dinner. Additional Doses per Sliding Scale: If Blood Sugar is 200 above Take 1 unit, above 250 Take 2 units, above 300 Take 3 units, above 350 Take 4 units, above 400 Take 5 units & above 450 Take 6 units. 12/15/15   Shary Decamp, PA-C  insulin lispro (HUMALOG) 100 UNIT/ML injection Inject 1-15 Units into the skin 3 (three) times daily before meals.    [provider]  metoCLOPramide (REGLAN) 5 MG tablet Take 1 tablet (5 mg total) by mouth every 6 (six) hours as needed for nausea, vomiting or refractory nausea / vomiting. Patient not taking: Reported on 12/15/2015 12/23/14   Robbie Lis, MD  ondansetron [facility] ODT) 4 MG disintegrating tablet 4mg  ODT q4 hours prn nausea/vomit 10/15/17   Ripley Fraise, MD  promethazine (PHENERGAN) 25 MG tablet Take 1 tablet (25 mg total) by mouth every 6 (six) hours as needed for nausea or vomiting. 10/16/17   Reggie Welge, Barbette Hair, MD  sucralfate (CARAFATE) 1 GM/10ML suspension Take 10 mLs (1 g total) by mouth 4 (four) times daily -  with meals and at bedtime. Patient not taking: Reported on 12/15/2015 12/23/14   Charlies Silvers,  Link Snuffer, MD    Family History No family history on file.  Social History Social History   Tobacco Use  . Smoking status: Light Tobacco Smoker  . Smokeless tobacco: Never Used  Substance Use Topics  . Alcohol use: No  . Drug use: No     Allergies   Patient has no known allergies.   Review of Systems Review of Systems  Constitutional: Negative for fever.  Respiratory: Negative for shortness of breath.   Cardiovascular: Negative for chest pain.  Gastrointestinal: Positive for nausea and vomiting. Negative for abdominal pain and diarrhea.  Genitourinary:  Positive for dysuria and flank pain.  All other systems reviewed and are negative.    Physical Exam Updated Vital Signs BP 118/88 (BP Location: Left Arm)   Pulse 82   Temp 98.5 F (36.9 C) (Oral)   Resp 16   Ht 5' (1.524 m)   Wt 63.5 kg (140 lb)   LMP 09/30/2017   SpO2 99%   BMI 27.34 kg/m   Physical Exam  Constitutional: She is oriented to person, place, and time. She appears well-developed and well-nourished.  HENT:  Head: Normocephalic and atraumatic.  Mucous membranes dry  Cardiovascular: Normal rate, regular rhythm and normal heart sounds.  Pulmonary/Chest: Effort normal and breath sounds normal. No respiratory distress. She has no wheezes.  Abdominal: Soft. Bowel sounds are normal. There is no tenderness. There is no guarding.  Genitourinary:  Genitourinary Comments: No CVA tenderness  Neurological: She is alert and oriented to person, place, and time.  Skin: Skin is warm and dry.  Psychiatric: She has a normal mood and affect.  Nursing note and vitals reviewed.    ED Treatments / Results  Labs (all labs ordered are listed, but only abnormal results are displayed) Labs Reviewed  CBC WITH DIFFERENTIAL/PLATELET - Abnormal; Notable for the following components:      Result Value   RBC 3.60 (*)    Hemoglobin 11.7 (*)    HCT 33.8 (*)    RDW 11.1 (*)    All other components within normal limits  BASIC METABOLIC PANEL - Abnormal; Notable for the following components:   Glucose, Bld 369 (*)    Creatinine, Ser 1.15 (*)    Calcium 8.6 (*)    All other components within normal limits  URINALYSIS, ROUTINE W REFLEX MICROSCOPIC - Abnormal; Notable for the following components:   Glucose, UA >=500 (*)    Hgb urine dipstick LARGE (*)    Ketones, ur 40 (*)    Protein, ur 30 (*)    All other components within normal limits  URINALYSIS, MICROSCOPIC (REFLEX) - Abnormal; Notable for the following components:   Bacteria, UA RARE (*)    Squamous Epithelial / LPF 0-5 (*)     Non Squamous Epithelial PRESENT (*)    All other components within normal limits  CBG MONITORING, ED - Abnormal; Notable for the following components:   Glucose-Capillary 365 (*)    All other components within normal limits  CBG MONITORING, ED - Abnormal; Notable for the following components:   Glucose-Capillary 218 (*)    All other components within normal limits    EKG  EKG Interpretation None       Radiology Ct Renal Stone Study  Result Date: 10/14/2017 CLINICAL DATA:  Left flank pain since Friday night.  History of UTI. EXAM: CT ABDOMEN AND PELVIS WITHOUT CONTRAST TECHNIQUE: Multidetector CT imaging of the abdomen and pelvis was performed following the standard protocol without  IV contrast. COMPARISON:  None. FINDINGS: Lower chest: No acute abnormality. Hepatobiliary: No focal liver abnormality is seen. No gallstones, gallbladder wall thickening, or biliary dilatation. Pancreas: Unremarkable. No pancreatic ductal dilatation or surrounding inflammatory changes. Spleen: Normal in size without focal abnormality. Adrenals/Urinary Tract: Adrenal glands appear normal. Kidneys are unremarkable without mass, stone or hydronephrosis. No perinephric fluid seen. No ureteral or bladder calculi identified. Bladder appears normal. Stomach/Bowel: No dilated large or small bowel loops identified. Appendix appears normal, incompletely visualized. Stomach appears normal. Moderate amount of stool and gas throughout the nondistended colon. Vascular/Lymphatic: No significant vascular findings are present. No enlarged abdominal or pelvic lymph nodes. Reproductive: Uterus and bilateral adnexa are unremarkable, although poorly seen due to overlapping fluid-filled small bowel loops in the pelvis. Other: Trace free fluid in the lower pelvis is likely physiologic in nature. No significant free fluid within the abdomen or pelvis. No abscess collection identified. No free intraperitoneal air. Musculoskeletal: No acute or  significant osseous findings. Superficial soft tissues are unremarkable. IMPRESSION: Normal noncontrast CT of the abdomen and pelvis. No acute findings. No renal or ureteral calculi identified. Appendix appears normal. No dilated bowel. Adnexal structures are difficult to definitively characterize due to overlapping fluid-filled bowel loops in the pelvis. If symptoms could be adnexal, consider pelvic ultrasound for further characterization. Electronically Signed   By: Franki Cabot M.D.   On: 10/14/2017 23:52    Procedures Procedures (including critical care time)  Medications Ordered in ED Medications  cefTRIAXone (ROCEPHIN) 1 g in sodium chloride 0.9 % 100 mL IVPB (0 g Intravenous Stopped 10/16/17 0459)  sodium chloride 0.9 % bolus 1,000 mL (0 mLs Intravenous Stopped 10/16/17 0500)  ondansetron (ZOFRAN) injection 4 mg (4 mg Intravenous Given 10/16/17 0419)  sodium chloride 0.9 % bolus 1,000 mL (0 mLs Intravenous Stopped 10/16/17 0600)  promethazine (PHENERGAN) injection 12.5 mg (12.5 mg Intravenous Given 10/16/17 0513)     Initial Impression / Assessment and Plan / ED Course  I have reviewed the triage vital signs and the nursing notes.  Pertinent labs & imaging results that were available during my care of the patient were reviewed by me and considered in my medical decision making (see chart for details).  Clinical Course as of Oct 16 699  Tue Oct 16, 2017  0508 Patient improved after vomiting.  Lab work reviewed.  No evidence of anion gap to suggest DKA.  Will order an additional liter of fluids and Phenergan.  Patient will need to be able to tolerate fluids to take antibiotics and avoid hyperglycemia or DKA.  Patient would like to go home if at all possible.  We will continue to medically manage.  [CH]  [phone] Patient resting comfortably.  She has not attempted to drink.  Discussed with patient as she is unable to orally hydrate, she may need admission.  States she will attempt to drink.   [CH]    Clinical Course User Index [CH] Maretta Overdorf, Barbette Hair, MD    Patient presents with nausea and vomiting.  Unable to keep down her antibiotics.  History of diabetes and reports persistent hyperglycemia at home.  She does appear dry.  Otherwise nontoxic-appearing.  Repeat lab work ordered.  Patient was given 2 L of fluids.  She was given a dose of Rocephin.  Urine culture and chart reviewed.  No speciation or culture results at this time.  She is hyperglycemic but no evidence of DKA.  Bicarbonate is 23.  Anion gap is 11.  She did  have one episode of nonbilious, nonbloody emesis in the emergency room.  She was redosed Phenergan.  On multiple rechecks is resting comfortably.  She is able to tolerate fluids after hydration and Phenergan.  She wishes to try to go home.  Given that she has no evidence of DKA and is able to tolerate fluids and has proven she can hydrate, do not feel this is unreasonable.  She does not have any antiemetic at home.  Will discharge with Phenergan.  Discussed with her that if she needs to monitor her sugars closely and return for any worsening fevers, dehydration, persistent vomiting, worsening pain.  Patient stated understanding.  After history, exam, and medical workup I feel the patient has been appropriately medically screened and is safe for discharge home. Pertinent diagnoses were discussed with the patient. Patient was given return precautions.   Final Clinical Impressions(s) / ED Diagnoses   Final diagnoses:  Pyelonephritis  Nausea and vomiting, intractability of vomiting not specified, unspecified vomiting type  Dehydration  Hyperglycemia    ED Discharge Orders        Ordered    promethazine (PHENERGAN) 25 MG tablet  Every 6 hours PRN     10/16/17 0700       Teegan Guinther, Barbette Hair, MD 10/16/17 [phone]

## 2017-10-16 NOTE — ED Notes (Signed)
Given po fluids for fluid challenge

## 2017-10-16 NOTE — Discharge Instructions (Signed)
You were seen today for nausea and vomiting.  This may be related to your ongoing pyelonephritis.  Continue to monitor your blood sugars closely.  You do not have any evidence of DKA at this time.  You will be discharged home with Phenergan.  Continue to take your antibiotics.  If you develop fevers, refractory vomiting, worsening pain you need to be reevaluated.

## 2017-10-17 LAB — URINE CULTURE

## 2017-10-18 ENCOUNTER — Telehealth: Payer: Self-pay | Admitting: Emergency Medicine

## 2017-10-18 NOTE — Telephone Encounter (Signed)
Post ED Visit - Positive Culture Follow-up  Culture report reviewed by antimicrobial stewardship pharmacist:  []  Elenor Quinones, Pharm.D. []  Heide Guile, Pharm.D., BCPS AQ-ID []  Parks Neptune, Pharm.D., BCPS [x]  Alycia Rossetti, Pharm.D., BCPS []  [place], Pharm.D., BCPS, AAHIVP []  Legrand Como, Pharm.D., BCPS, AAHIVP []  Salome Arnt, PharmD, BCPS []  Jalene Mullet, PharmD []  Vincenza Hews, PharmD, BCPS  Positive urine culture Treated with cephalexin, organism sensitive to the same and no further patient follow-up is required at this time.  [name] 10/18/2017, 9:45 AM

## 2017-12-25 ENCOUNTER — Other Ambulatory Visit: Payer: Self-pay | Admitting: Internal Medicine

## 2017-12-25 DIAGNOSIS — N182 Chronic kidney disease, stage 2 (mild): Secondary | ICD-10-CM

## 2019-04-30 ENCOUNTER — Inpatient Hospital Stay (HOSPITAL_COMMUNITY)
Admission: AD | Admit: 2019-04-30 | Discharge: 2019-05-02 | DRG: 832 | Disposition: A | Discharge status: Home / Self Care | Payer: Managed Care, Other (non HMO) | Attending: Obstetrics and Gynecology | Admitting: Obstetrics and Gynecology

## 2019-04-30 ENCOUNTER — Other Ambulatory Visit: Payer: Self-pay

## 2019-04-30 ENCOUNTER — Encounter (HOSPITAL_COMMUNITY): Payer: Self-pay

## 2019-04-30 DIAGNOSIS — O99011 Anemia complicating pregnancy, first trimester: Secondary | ICD-10-CM | POA: Diagnosis present

## 2019-04-30 DIAGNOSIS — O2341 Unspecified infection of urinary tract in pregnancy, first trimester: Secondary | ICD-10-CM | POA: Diagnosis present

## 2019-04-30 DIAGNOSIS — E1022 Type 1 diabetes mellitus with diabetic chronic kidney disease: Secondary | ICD-10-CM | POA: Diagnosis present

## 2019-04-30 DIAGNOSIS — O99331 Smoking (tobacco) complicating pregnancy, first trimester: Secondary | ICD-10-CM | POA: Diagnosis present

## 2019-04-30 DIAGNOSIS — E1065 Type 1 diabetes mellitus with hyperglycemia: Secondary | ICD-10-CM | POA: Diagnosis present

## 2019-04-30 DIAGNOSIS — G43909 Migraine, unspecified, not intractable, without status migrainosus: Secondary | ICD-10-CM | POA: Diagnosis present

## 2019-04-30 DIAGNOSIS — O24011 Pre-existing diabetes mellitus, type 1, in pregnancy, first trimester: Secondary | ICD-10-CM | POA: Diagnosis present

## 2019-04-30 DIAGNOSIS — F17200 Nicotine dependence, unspecified, uncomplicated: Secondary | ICD-10-CM | POA: Diagnosis present

## 2019-04-30 DIAGNOSIS — Z20828 Contact with and (suspected) exposure to other viral communicable diseases: Secondary | ICD-10-CM | POA: Diagnosis present

## 2019-04-30 DIAGNOSIS — Z794 Long term (current) use of insulin: Secondary | ICD-10-CM | POA: Diagnosis not present

## 2019-04-30 DIAGNOSIS — O99341 Other mental disorders complicating pregnancy, first trimester: Secondary | ICD-10-CM | POA: Diagnosis present

## 2019-04-30 DIAGNOSIS — E1021 Type 1 diabetes mellitus with diabetic nephropathy: Secondary | ICD-10-CM | POA: Diagnosis present

## 2019-04-30 DIAGNOSIS — O26891 Other specified pregnancy related conditions, first trimester: Secondary | ICD-10-CM | POA: Diagnosis present

## 2019-04-30 DIAGNOSIS — Z6791 Unspecified blood type, Rh negative: Secondary | ICD-10-CM | POA: Diagnosis not present

## 2019-04-30 DIAGNOSIS — O99351 Diseases of the nervous system complicating pregnancy, first trimester: Secondary | ICD-10-CM | POA: Diagnosis present

## 2019-04-30 DIAGNOSIS — Z3A01 Less than 8 weeks gestation of pregnancy: Secondary | ICD-10-CM

## 2019-04-30 DIAGNOSIS — Z9119 Patient's noncompliance with other medical treatment and regimen: Secondary | ICD-10-CM

## 2019-04-30 DIAGNOSIS — A6000 Herpesviral infection of urogenital system, unspecified: Secondary | ICD-10-CM | POA: Diagnosis present

## 2019-04-30 DIAGNOSIS — I129 Hypertensive chronic kidney disease with stage 1 through stage 4 chronic kidney disease, or unspecified chronic kidney disease: Secondary | ICD-10-CM | POA: Diagnosis present

## 2019-04-30 DIAGNOSIS — N182 Chronic kidney disease, stage 2 (mild): Secondary | ICD-10-CM | POA: Diagnosis present

## 2019-04-30 DIAGNOSIS — O368310 Maternal care for abnormalities of the fetal heart rate or rhythm, first trimester, not applicable or unspecified: Secondary | ICD-10-CM | POA: Diagnosis present

## 2019-04-30 DIAGNOSIS — F419 Anxiety disorder, unspecified: Secondary | ICD-10-CM | POA: Diagnosis present

## 2019-04-30 DIAGNOSIS — O98311 Other infections with a predominantly sexual mode of transmission complicating pregnancy, first trimester: Secondary | ICD-10-CM | POA: Diagnosis present

## 2019-04-30 DIAGNOSIS — D649 Anemia, unspecified: Secondary | ICD-10-CM | POA: Diagnosis present

## 2019-04-30 DIAGNOSIS — R809 Proteinuria, unspecified: Secondary | ICD-10-CM

## 2019-04-30 LAB — GLUCOSE, CAPILLARY
Glucose-Capillary: 51 mg/dL — ABNORMAL LOW (ref 70–99)
Glucose-Capillary: 66 mg/dL — ABNORMAL LOW (ref 70–99)
Glucose-Capillary: 136 mg/dL — ABNORMAL HIGH (ref 70–99)

## 2019-04-30 LAB — SARS CORONAVIRUS 2 (TAT 6-24 HRS): SARS Coronavirus 2: NEGATIVE

## 2019-04-30 MED ORDER — SERTRALINE HCL 50 MG PO TABS
25.0000 mg | ORAL_TABLET | Freq: Every day | ORAL | Status: DC
  Administered 2019-04-30 – 2019-05-02 (×3): 25 mg via ORAL
  Filled 2019-04-30 (×3): qty 1

## 2019-04-30 MED ORDER — INSULIN ASPART 100 UNIT/ML ~~LOC~~ SOLN
0.0000 [IU] | Freq: Three times a day (TID) | SUBCUTANEOUS | Status: DC
  Administered 2019-05-01: 2 [IU] via SUBCUTANEOUS
  Administered 2019-05-01: 3 [IU] via SUBCUTANEOUS
  Administered 2019-05-01: 1 [IU] via SUBCUTANEOUS

## 2019-04-30 MED ORDER — NITROFURANTOIN MONOHYD MACRO 100 MG PO CAPS
100.0000 mg | ORAL_CAPSULE | Freq: Two times a day (BID) | ORAL | Status: DC
  Administered 2019-04-30 – 2019-05-02 (×4): 100 mg via ORAL
  Filled 2019-04-30 (×5): qty 1

## 2019-04-30 MED ORDER — CALCIUM CARBONATE ANTACID 500 MG PO CHEW: 2.0000 | CHEWABLE_TABLET | ORAL | Status: DC | PRN

## 2019-04-30 MED ORDER — DOCUSATE SODIUM 100 MG PO CAPS
100.0000 mg | ORAL_CAPSULE | Freq: Every day | ORAL | Status: DC
  Administered 2019-05-01 – 2019-05-02 (×2): 100 mg via ORAL
  Filled 2019-04-30 (×2): qty 1

## 2019-04-30 MED ORDER — INSULIN NPH (HUMAN) (ISOPHANE) 100 UNIT/ML ~~LOC~~ SUSP
12.0000 [IU] | Freq: Two times a day (BID) | SUBCUTANEOUS | Status: DC
  Administered 2019-04-30 – 2019-05-02 (×4): 12 [IU] via SUBCUTANEOUS
  Filled 2019-04-30: qty 10

## 2019-04-30 MED ORDER — INSULIN ASPART 100 UNIT/ML ~~LOC~~ SOLN
0.0000 [IU] | Freq: Three times a day (TID) | SUBCUTANEOUS | Status: DC
  Administered 2019-04-30: 3 [IU] via SUBCUTANEOUS
  Administered 2019-05-01: 4 [IU] via SUBCUTANEOUS
  Administered 2019-05-01: 2 [IU] via SUBCUTANEOUS
  Administered 2019-05-01: 3 [IU] via SUBCUTANEOUS

## 2019-04-30 MED ORDER — ZOLPIDEM TARTRATE 5 MG PO TABS
5.0000 mg | ORAL_TABLET | Freq: Every evening | ORAL | Status: DC | PRN
  Administered 2019-04-30 – 2019-05-01 (×2): 5 mg via ORAL
  Filled 2019-04-30 (×2): qty 1

## 2019-04-30 MED ORDER — PRENATAL MULTIVITAMIN CH
1.0000 | ORAL_TABLET | Freq: Every day | ORAL | Status: DC
  Administered 2019-05-02: 1 via ORAL
  Filled 2019-04-30: qty 1

## 2019-04-30 MED ORDER — ACETAMINOPHEN 325 MG PO TABS
650.0000 mg | ORAL_TABLET | ORAL | Status: DC | PRN
  Administered 2019-04-30 – 2019-05-01 (×3): 650 mg via ORAL
  Filled 2019-04-30 (×3): qty 2

## 2019-04-30 MED ORDER — ONDANSETRON HCL 4 MG PO TABS
4.0000 mg | ORAL_TABLET | Freq: Four times a day (QID) | ORAL | Status: DC | PRN
  Administered 2019-05-01 (×2): 4 mg via ORAL
  Filled 2019-04-30 (×2): qty 1

## 2019-04-30 NOTE — Progress Notes (Signed)
Hypoglycemic Event  CBG: 51  Treatment: crackers and peanut butter given, patient ordered meal  Symptoms: dizziness  Follow-up CBG: Time:1843 CBG Result: 66  Possible Reasons for Event: pt has eaten all day  Comments/MD notified: Dr Simona Huh notified. Rn to hold meal coverage novolog when meal arrives and proceed with 2 hour post prandial sliding scale.  Follow up CBG 66, pt eating meal.     [facility]

## 2019-04-30 NOTE — H&P (Addendum)
[facility] is a 23 y.o. female G2 P0010 @ 5 5/7 weeks with Type I DM  revised by today's ultrasound admitted due hyperglycemia.  Pt reports she has not been managed by a physician in the last 6 months.  She has been buying NPH over-the-counter and managing her diabetes herself. Pt has been on NPH with meals 7-10.  Fasting this am was 89. Pt reports she ate 6 grapes and 2 hour postprandial sugar was 455. Per pt, her Hg A1C six months ago was 13.  Two days ago it was 11.4.  Pt denies eye exam within the last year.  Review of Epic chart reveals she had DKA in 2016 and several other ED visits for hyperglycemia.  Pt submitted a 24 hour urine yesterday which showed protein of  5021 (vol 1300), Creatinine clearance 78, Creatinine 1.43, GFR 44 (African American).  BP in the office has been mildly elevated.  Pt reports she has a h/o anxiety for which she use to take medications.  She was started on Zoloft 2 days ago.  P[facility]atal care with [facility] Huh) was established 2 days ago.  Prenatal labs remarkable for Rh negative, Anemia (Hg 10.4) and UTI (E. Coli).  Ultrasound today in the office showed Fetal bradycardia, 92 and size less than dates by 7 days.  EDD revised but there is concern for the viability of this pregnancy going forward.  OB History    Gravida  2   Para      Term      Preterm      AB  1   Living        SAB  1   TAB      Ectopic      Multiple      Live Births             Past Medical History:  Diagnosis Date  . Diabetes mellitus without complication ([place])    History reviewed. No pertinent surgical history. Family History: family history is not on file. Social History:  reports that she has been smoking. She has never used smokeless tobacco. She reports that she does not drink alcohol or use drugs.  ROS Maternal Medical History:  Prenatal complications: Nephropathy  Prenatal Complications - Diabetes: type 1. Diabetes is managed by insulin injections.         Blood pressure (!) 143/98, pulse 98, temperature 98.9 F (37.2 C), temperature source Oral, resp. rate 20, height 5' (1.524 m), weight 68.9 kg, last menstrual period 03/14/2019, SpO2 100 %.   Fetal Exam Fetal Monitor Review: Mode: ultrasound.   Baseline rate: 92.      Physical Exam  Constitutional: She is oriented to person, place, and time. She appears well-developed and well-nourished. No distress.  HENT:  Head: Normocephalic and atraumatic.  Eyes: EOM are normal.  Neck: Normal range of motion.  Cardiovascular: Normal rate and regular rhythm.  No murmur heard. Respiratory: Effort normal. No respiratory distress.  GI: She exhibits no distension. There is no abdominal tenderness.  Musculoskeletal: Normal range of motion.        General: No tenderness.  Neurological: She is alert and oriented to person, place, and time.  Skin: Skin is warm and dry.  Psychiatric: She has a normal mood and affect.     Assessment/Plan: IUP @ 5 5/7 weeks with Fetal bradycardia  In light of size less than dates with sure LMP, increased risk of SAB.   F/u ultrasound in 10 days.  Type I DM with Nephropathy, uncontrolled  -Nephropathy, newly diagnosed. UTI at time of collection but elevated Cr and decreased Cr Cl   confirm decreased renal function.  -Poor management and noncompliance has resulted in the complications above.  -Initial insulin dose determined with diabetes coordinator, who will f/u with pt in am.   NPH 12 units BID, Novalog AC meals 1 unit per 6 carbs   Sliding scale postprandial prn.   CBGs  Fasting and 2 hours postprandial   Nutrition consult   Consult Nephrology in am.   Opthalmology exam as an outpatient.   Reestablish care with endocrinology upon discharge.  Elevated BP-Originally thought it was likely due to anxiety but in light of newly diagnosed nephropathy may  be due to renal disease.  -Monitor BP closely.  -If remains elevated consider antihypertensive in  light of nephropathy.  UTI-Remote H/o Pyelonephritis  -Macrobid 100 mg po BID x 7 days.  Anxiety-Restarting medication.  Continue Zoloft.  Anemia  PNV with iron.  Add in iron supplement as tolerated.   HSV 2- Asymptomatic currently.  Rh Negative  Treatment plan reviewed with pt.  Questions answered.  Pt informed that Dr. Landry Mellow will be on call for me tomorrow.   [name] 04/30/2019, 6:16 PM

## 2019-04-30 NOTE — MAU Note (Signed)
Covid swab collected. Pt tolerated well. PT asymptomatic 

## 2019-04-30 NOTE — Plan of Care (Signed)
  Problem: Education: ?Goal: Knowledge of General Education information will improve ?Description: Including pain rating scale, medication(s)/side effects and non-pharmacologic comfort measures ?Outcome: Progressing ?  ?Problem: Health Behavior/Discharge Planning: ?Goal: Ability to manage health-related needs will improve ?Outcome: Progressing ?  ?Problem: Clinical Measurements: ?Goal: Will remain free from infection ?Outcome: Progressing ?  ?Problem: Clinical Measurements: ?Goal: Diagnostic test results will improve ?Outcome: Progressing ?  ?Problem: Clinical Measurements: ?Goal: Respiratory complications will improve ?Outcome: Progressing ?  ?Problem: Clinical Measurements: ?Goal: Cardiovascular complication will be avoided ?Outcome: Progressing ?  ?

## 2019-04-30 NOTE — Progress Notes (Addendum)
Pt admitted to [facility].  Pt reports CBG is 68- pt drank a few sips of apple juice. CBG rechecked at 1745: CBG 78

## 2019-04-30 NOTE — Progress Notes (Signed)
Inpatient Diabetes Program Recommendations  ADA Standards of Care 2019 Diabetes in Pregnancy Target Glucose Ranges:  Fasting: 60 - 90 mg/dL Preprandial: 60 - 105 mg/dL 1 hr postprandial: Less than 140mg /dL (from first bite of meal) 2 hr postprandial: Less than 120 mg/dL (from first bit of meal)   Lab Results  Component Value Date   GLUCAP 218 (H) 10/16/2017   HGBA1C 12.1 (H) 12/18/2014    Review of Glycemic Control  Diabetes history: DM1 Outpatient Diabetes medications: Novolin 12 units bid + Novolin Regular tid meal coverage  Inpatient Diabetes Program Recommendations:   Received page from Dr. Simona Huh to discuss insulin management. Weight is 152 lbs=69 kg. Plans for insulin: NPH 12 units bid Novolog correction (0-14 scale) 2 hrs post prandial tid (10 am, 1400, & 1900) Novolog meal coverage 1 unit insulin per 10 gms tid carbohydrate (0-6 units) CBGS 6 am, 10 am, 14, & 1900. Will follow patient during hospitalization.  Thank you, Nani Gasser. Taevon Aschoff, RN, MSN, CDE  Diabetes Coordinator Inpatient Glycemic Control Team Team Pager [phone] (8am-5pm) 04/30/2019 6:10 PM

## 2019-05-01 ENCOUNTER — Inpatient Hospital Stay (HOSPITAL_COMMUNITY): Payer: Managed Care, Other (non HMO)

## 2019-05-01 LAB — GLUCOSE, CAPILLARY
Glucose-Capillary: 136 mg/dL — ABNORMAL HIGH (ref 70–99)
Glucose-Capillary: 127 mg/dL — ABNORMAL HIGH (ref 70–99)
Glucose-Capillary: 109 mg/dL — ABNORMAL HIGH (ref 70–99)
Glucose-Capillary: 63 mg/dL — ABNORMAL LOW (ref 70–99)
Glucose-Capillary: 180 mg/dL — ABNORMAL HIGH (ref 70–99)
Glucose-Capillary: 110 mg/dL — ABNORMAL HIGH (ref 70–99)

## 2019-05-01 LAB — PROTEIN / CREATININE RATIO, URINE
Creatinine, Urine: 110.63 mg/dL
Protein Creatinine Ratio: 3.15 mg/mg{Cre} — ABNORMAL HIGH (ref 0.00–0.15)
Total Protein, Urine: 349 mg/dL

## 2019-05-01 LAB — RENAL FUNCTION PANEL
Albumin: 2.3 g/dL — ABNORMAL LOW (ref 3.5–5.0)
Anion gap: 7 (ref 5–15)
BUN: 14 mg/dL (ref 6–20)
CO2: 24 mmol/L (ref 22–32)
Calcium: 8.5 mg/dL — ABNORMAL LOW (ref 8.9–10.3)
Chloride: 104 mmol/L (ref 98–111)
Creatinine, Ser: 1.27 mg/dL — ABNORMAL HIGH (ref 0.44–1.00)
GFR calc Af Amer: >60 mL/min (ref >60)
GFR calc non Af Amer: 59 mL/min — ABNORMAL LOW (ref >60)
Glucose, Bld: 207 mg/dL — ABNORMAL HIGH (ref 70–99)
Phosphorus: 4.5 mg/dL (ref 2.5–4.6)
Potassium: 3.9 mmol/L (ref 3.5–5.1)
Sodium: 135 mmol/L (ref 135–145)

## 2019-05-01 LAB — URINALYSIS, ROUTINE W REFLEX MICROSCOPIC
Bilirubin Urine: NEGATIVE
Glucose, UA: NEGATIVE mg/dL
Ketones, ur: NEGATIVE mg/dL
Nitrite: NEGATIVE
Protein, ur: >=300 mg/dL — AB
Specific Gravity, Urine: 1.012 (ref 1.005–1.030)
pH: 6 (ref 5.0–8.0)

## 2019-05-01 MED ORDER — ONDANSETRON HCL 4 MG PO TABS
8.0000 mg | ORAL_TABLET | Freq: Three times a day (TID) | ORAL | Status: DC | PRN
  Administered 2019-05-02: 8 mg via ORAL
  Filled 2019-05-01: qty 2

## 2019-05-01 MED ORDER — PROMETHAZINE HCL 25 MG PO TABS: 12.5000 mg | ORAL_TABLET | Freq: Four times a day (QID) | ORAL | Status: DC | PRN

## 2019-05-01 MED ORDER — PROMETHAZINE HCL 25 MG/ML IJ SOLN: 12.5000 mg | Freq: Four times a day (QID) | INTRAMUSCULAR | Status: DC | PRN

## 2019-05-01 NOTE — Progress Notes (Signed)
Pt ID :HD #2 at 5 wks and 6 days with uncontrolled type 1 DM  Subjective: Patient reports episode of emesis this am after eating 2 slices of bacon . It has resolved. She has not had lunch yet . She denies pelvic pain . No other complaints.   Objective: I have reviewed patient's vital signs, intake and output, medications and labs. Vitals:   05/01/19 0919 05/01/19 1132 05/01/19 1210 05/01/19 1551  BP: (!) 145/96 127/87  129/83  Pulse: 87 92 96 91  Resp: 16 16    Temp: 98.3 F (36.8 C) 98.4 F (36.9 C)  98.3 F (36.8 C)  TempSrc: Oral Oral  Oral  SpO2: 97% 91% 98% 98%  Weight:      Height:        General: alert, cooperative and no distress GI: soft, non-tender; bowel sounds normal; no masses,  no organomegaly Extremities: extremities normal, atraumatic, no cyanosis or edema   Results for orders placed or performed during the hospital encounter of 04/30/19 (from the past 24 hour(s))  Glucose, capillary     Status: Abnormal   Collection Time: 04/30/19  6:25 PM  Result Value Ref Range   Glucose-Capillary 51 (L) 70 - 99 mg/dL   Comment 1 Notify RN   Glucose, capillary     Status: Abnormal   Collection Time: 04/30/19  6:43 PM  Result Value Ref Range   Glucose-Capillary 66 (L) 70 - 99 mg/dL   Comment 1 Notify RN   Glucose, capillary     Status: Abnormal   Collection Time: 04/30/19  8:51 PM  Result Value Ref Range   Glucose-Capillary 136 (H) 70 - 99 mg/dL  Glucose, capillary     Status: Abnormal   Collection Time: 05/01/19  6:38 AM  Result Value Ref Range   Glucose-Capillary 136 (H) 70 - 99 mg/dL  Urinalysis, Routine w reflex microscopic     Status: Abnormal   Collection Time: 05/01/19  8:45 AM  Result Value Ref Range   Color, Urine AMBER (A) YELLOW   APPearance CLOUDY (A) CLEAR   Specific Gravity, Urine 1.012 1.005 - 1.030   pH 6.0 5.0 - 8.0   Glucose, UA NEGATIVE NEGATIVE mg/dL   Hgb urine dipstick MODERATE (A) NEGATIVE   Bilirubin Urine NEGATIVE NEGATIVE   Ketones, ur  NEGATIVE NEGATIVE mg/dL   Protein, ur >=300 (A) NEGATIVE mg/dL   Nitrite NEGATIVE NEGATIVE   Leukocytes,Ua SMALL (A) NEGATIVE   RBC / HPF 11-20 0 - 5 RBC/hpf   WBC, UA 21-50 0 - 5 WBC/hpf   Bacteria, UA MANY (A) NONE SEEN   Squamous Epithelial / LPF 0-5 0 - 5   Non Squamous Epithelial 0-5 (A) NONE SEEN  Protein / creatinine ratio, urine     Status: Abnormal   Collection Time: 05/01/19  8:45 AM  Result Value Ref Range   Creatinine, Urine 110.63 mg/dL   Total Protein, Urine 349 mg/dL   Protein Creatinine Ratio 3.15 (H) 0.00 - 0.15 mg/mg[Cre]  Renal function panel     Status: Abnormal   Collection Time: 05/01/19  9:48 AM  Result Value Ref Range   Sodium 135 135 - 145 mmol/L   Potassium 3.9 3.5 - 5.1 mmol/L   Chloride 104 98 - 111 mmol/L   CO2 24 22 - 32 mmol/L   Glucose, Bld 207 (H) 70 - 99 mg/dL   BUN 14 6 - 20 mg/dL   Creatinine, Ser 1.27 (H) 0.44 - 1.00 mg/dL  Calcium 8.5 (L) 8.9 - 10.3 mg/dL   Phosphorus 4.5 2.5 - 4.6 mg/dL   Albumin 2.3 (L) 3.5 - 5.0 g/dL   GFR calc non Af Amer 59 (L) >60 mL/min   GFR calc Af Amer >60 >60 mL/min   Anion gap 7 5 - 15  Glucose, capillary     Status: Abnormal   Collection Time: 05/01/19 11:29 AM  Result Value Ref Range   Glucose-Capillary 127 (H) 70 - 99 mg/dL   Comment 1 Notify RN    Comment 2 Document in Chart   Glucose, capillary     Status: Abnormal   Collection Time: 05/01/19  1:59 PM  Result Value Ref Range   Glucose-Capillary 63 (L) 70 - 99 mg/dL   Comment 1 Notify RN    Comment 2 Document in Chart   Glucose, capillary     Status: Abnormal   Collection Time: 05/01/19  2:29 PM  Result Value Ref Range   Glucose-Capillary 109 (H) 70 - 99 mg/dL     Assessment/Plan: IUP @ 5 6/7 weeks with Fetal bradycardia             In light of size less than dates with sure LMP, increased risk of SAB.                       F/u ultrasound in 10 days.  Type I DM with Nephropathy, uncontrolled             -Nephropathy, newly diagnosed. UTI  at time of collection but elevated Cr and decreased Cr Cl         confirm decreased renal function.             -Poor management and noncompliance has resulted in the complications above.             -Initial insulin dose determined with diabetes coordinator,                          NPH 12 units BID, Novalog AC meals 1 unit per 6 carbs                         Sliding scale postprandial prn.                         CBGs  Fasting and 2 hours postprandial                          Nephrology consult PENDING                         Opthalmology exam as an outpatient.                         Reestablish care with endocrinology upon discharge.  Elevated BP-Originally thought it was likely due to anxiety but in light of newly diagnosed nephropathy may       be due to renal disease.             -Monitor BP closely.             -If remains elevated consider antihypertensive in light of nephropathy.  UTI-Remote H/o Pyelonephritis             -Macrobid 100 mg po BID x 7 days.  Anxiety-Restarting medication.             Continue Zoloft.  Anemia             PNV with iron.  Add in iron supplement as tolerated.   HSV 2- Asymptomatic currently.  Rh Negative Dr. Simona Huh assuming care 05/02/2019 at 7 am   Christophe Louis 05/01/2019, 5:50 PM

## 2019-05-01 NOTE — Progress Notes (Signed)
Hypoglycemic Event  CBG: 63  Treatment: 15 g peanut butter and crackers given at the bedside instead of 4 oz of juice due to past experiences with the juice resulting in sharp, sudden increases in BS.   Symptoms: pt stated she was not "feeling well"  Follow-up CBG: Time:1429 CBG Result: 109  Possible Reasons for Event: lack of nutritional intake  Comments/MD notified:  Paged Dr. Landry Mellow who was attending on call, awaiting call back.     Ysabela Keisler, Dorathy Kinsman

## 2019-05-01 NOTE — Progress Notes (Signed)
Dr Landry Mellow returned call in regards to pt N/V maintenance, ordered Zofran 8 mg every 8 hours as needed and Phenergan 12.5 mg IV or PO every 6 hrs as needed. Will continue to monitor and notify physician of any changes.

## 2019-05-01 NOTE — Progress Notes (Signed)
  Nutrition Dx: Food and nutrition-related knowledge deficit r/t no previous education aeb newly diagnosed GDM.  Adm w/Type I DM with Nephropathy, uncontrolled, 5 6/7 weeks IUP  Has had DM education in the past but was uncertain about CHO counting. Nausea this morning, only ate bacon for breakfast  Nutrition education consult for Carbohydrate Modified Gestational Diabetic Diet completed.  "Meal  plan for gestational diabetics" handout given to patient.  Basic concepts reviewed.  Questions answered.  Patient verbalizes understanding.  Would benefit from further education/ reinforcement  as outpt  Weyman Rodney M.Fredderick Severance LDN Neonatal Nutrition Support Specialist/RD III Pager [phone]      Phone [phone]

## 2019-05-01 NOTE — Consult Note (Addendum)
[facility]  INPATIENT CONSULTATION  Reason for Consultation: proteinuria  Requesting Provider: Dr. Simona Huh  HPI: Kathy Lewis is an 23 y.o. female with type 1 DM (dx age 77) currently 48 5/[redacted]wks pregnant hospitalized with hyperglycemia and is seen in consult by nephrology for evaluation and management for proteinuria.    She notes that other than mild nausea she's currently feeling ok.  She notes new foamy urine in the past few weeks, cloudy for weeks to months but no hematuria or dysuria.  Mild LE edema when she presented but it's resolved now.  She has no eye injection or pain, oral ulcers, odd joint pains, rashes, sun sensitivity.  Adopted so does not know family history.    She admits diabetes has typically been poorly controlled.  Denies neuropathy; hasn't had eye exam for retinopathy lately.   No h/o nephrolithiasis.  ~1 UTI/year since early teens. She notes taking excedrin for HA daily for years.   Fetal ultrasound with fetal bradycardia and measuring small.  24h urine collection 9/1 reported with volume 1300, protein 5021mg , Crcl 78.   She had an E coli UTI diagnosed outpt, Hb 10.4   PMH: Past Medical History:  Diagnosis Date  . Diabetes mellitus without complication (HCC)    PSH: History reviewed. No pertinent surgical history.   Past Medical History:  Diagnosis Date  . Diabetes mellitus without complication ([place])     Medications: I have reviewed the patient's current medications. Insulin zoloft 25 daily macrobid BID PNV colace PRN tums, APAP, ambien, zofran  Medications Prior to Admission  Medication Sig Dispense Refill  . acetaminophen (TYLENOL) 500 MG tablet Take 500 mg by mouth every 6 (six) hours as needed for mild pain.    Marland Kitchen insulin regular (NOVOLIN R) 100 units/mL injection Inject 7-15 Units into the skin 3 (three) times daily before meals. Sliding Scale 150-180=7 units;181-230=10 units; 231-280=12 units; anything >280=15 units    . Prenatal  Vit-Fe Fumarate-FA (MULTIVITAMIN-PRENATAL) 27-0.8 MG TABS tablet Take 1 tablet by mouth daily at 12 noon.      ALLERGIES:  No Known Allergies  FAM HX: History reviewed. No pertinent family history.  Social History:   reports that she has been smoking. She has never used smokeless tobacco. She reports that she does not drink alcohol or use drugs.  ROS: 12 system ROS negative except per HPI above   Blood pressure 129/83, pulse 91, temperature 98.3 F (36.8 C), temperature source Oral, resp. rate 16, height 5' (1.524 m), weight 68.9 kg, last menstrual period 03/14/2019, SpO2 98 %. PHYSICAL EXAM: Gen: well appearing, sitting calmly in bed  Eyes: anicteric, conjunctiva not injected ENT: MMM, several caries noted, no ulcers Neck: supple, no adenopathy CV:  RRR, no rub Lungs: clear to bases Abd: soft, nontender Back: no CVA TTP GU: no foley Extr:  No edema, no synovitis or effusions Neuro: grossly nonfocal Skin: warm and dry, no rashes.    Results for orders placed or performed during the hospital encounter of 04/30/19 (from the past 48 hour(s))  SARS CORONAVIRUS 2 (TAT 6-24 HRS) Nasopharyngeal Nasopharyngeal Swab     Status: None   Collection Time: 04/30/19  5:17 PM   Specimen: Nasopharyngeal Swab  Result Value Ref Range   SARS Coronavirus 2 NEGATIVE NEGATIVE    Comment: (NOTE) SARS-CoV-2 target nucleic acids are NOT DETECTED. The SARS-CoV-2 RNA is generally detectable in upper and lower respiratory specimens during the acute phase of infection. Negative results do not preclude SARS-CoV-2 infection, do  not rule out co-infections with other pathogens, and should not be used as the sole basis for treatment or other patient management decisions. Negative results must be combined with clinical observations, patient history, and epidemiological information. The expected result is Negative. Fact Sheet for Patients: SugarRoll.be Fact Sheet for  Healthcare Providers: https://www.woods-mathews.com/ This test is not yet approved or cleared by the [place] FDA and  has been authorized for detection and/or diagnosis of SARS-CoV-2 by FDA under an Emergency Use Authorization (EUA). This EUA will remain  in effect (meaning this test can be used) for the duration of the COVID-19 declaration under Section 56 4(b)(1) of the Act, 21 U.S.C. section 360bbb-3(b)(1), unless the authorization is terminated or revoked sooner. Performed at [facility], [place] [address], [place], [place] [address]   Glucose, capillary     Status: Abnormal   Collection Time: 04/30/19  6:25 PM  Result Value Ref Range   Glucose-Capillary 51 (L) 70 - 99 mg/dL   Comment 1 Notify RN   Glucose, capillary     Status: Abnormal   Collection Time: 04/30/19  6:43 PM  Result Value Ref Range   Glucose-Capillary 66 (L) 70 - 99 mg/dL   Comment 1 Notify RN   Glucose, capillary     Status: Abnormal   Collection Time: 04/30/19  8:51 PM  Result Value Ref Range   Glucose-Capillary 136 (H) 70 - 99 mg/dL  Glucose, capillary     Status: Abnormal   Collection Time: 05/01/19  6:38 AM  Result Value Ref Range   Glucose-Capillary 136 (H) 70 - 99 mg/dL  Urinalysis, Routine w reflex microscopic     Status: Abnormal   Collection Time: 05/01/19  8:45 AM  Result Value Ref Range   Color, Urine AMBER (A) YELLOW    Comment: BIOCHEMICALS MAY BE AFFECTED BY COLOR   APPearance CLOUDY (A) CLEAR   Specific Gravity, Urine 1.012 1.005 - 1.030   pH 6.0 5.0 - 8.0   Glucose, UA NEGATIVE NEGATIVE mg/dL   Hgb urine dipstick MODERATE (A) NEGATIVE   Bilirubin Urine NEGATIVE NEGATIVE   Ketones, ur NEGATIVE NEGATIVE mg/dL   Protein, ur >=300 (A) NEGATIVE mg/dL   Nitrite NEGATIVE NEGATIVE   Leukocytes,Ua SMALL (A) NEGATIVE   RBC / HPF 11-20 0 - 5 RBC/hpf   WBC, UA 21-50 0 - 5 WBC/hpf   Bacteria, UA MANY (A) NONE SEEN   Squamous Epithelial / LPF 0-5 0 - 5   Non Squamous  Epithelial 0-5 (A) NONE SEEN    Comment: Performed at [facility], [place] [address] [place], [place] [address]  Protein / creatinine ratio, urine     Status: Abnormal   Collection Time: 05/01/19  8:45 AM  Result Value Ref Range   Creatinine, Urine 110.63 mg/dL   Total Protein, Urine 349 mg/dL    Comment: NO NORMAL RANGE ESTABLISHED FOR THIS TEST RESULTS CONFIRMED BY MANUAL DILUTION    Protein Creatinine Ratio 3.15 (H) 0.00 - 0.15 mg/mg[Cre]    Comment: Performed at [facility] [address], [place],  [address]  Renal function panel     Status: Abnormal   Collection Time: 05/01/19  9:48 AM  Result Value Ref Range   Sodium 135 135 - 145 mmol/L   Potassium 3.9 3.5 - 5.1 mmol/L   Chloride 104 98 - 111 mmol/L   CO2 24 22 - 32 mmol/L   Glucose, Bld 207 (H) 70 - 99 mg/dL  BUN 14 6 - 20 mg/dL   Creatinine, Ser 1.27 (H) 0.44 - 1.00 mg/dL   Calcium 8.5 (L) 8.9 - 10.3 mg/dL   Phosphorus 4.5 2.5 - 4.6 mg/dL   Albumin 2.3 (L) 3.5 - 5.0 g/dL   GFR calc non Af Amer 59 (L) >60 mL/min   GFR calc Af Amer >60 >60 mL/min   Anion gap 7 5 - 15    Comment: Performed at [facility] [address], [place], [place] [address]  Glucose, capillary     Status: Abnormal   Collection Time: 05/01/19 11:29 AM  Result Value Ref Range   Glucose-Capillary 127 (H) 70 - 99 mg/dL   Comment 1 Notify RN    Comment 2 Document in Chart   Glucose, capillary     Status: Abnormal   Collection Time: 05/01/19  1:59 PM  Result Value Ref Range   Glucose-Capillary 63 (L) 70 - 99 mg/dL   Comment 1 Notify RN    Comment 2 Document in Chart   Glucose, capillary     Status: Abnormal   Collection Time: 05/01/19  2:29 PM  Result Value Ref Range   Glucose-Capillary 109 (H) 70 - 99 mg/dL    No results found.  Assessment/Plan **CKD II/IIIa:  Presumably diabetic nephropathy given her history.  CrCl lower than expected on 24h urine collection, particularly in pregnancy.   She doesn't have systemic  symptoms to suggest etiology other than DM but given young AA female will rule out SLE with serologies (check ANA, C3, C4).  Renal US while admitted to r/o any obstructive issue.  At this point would not plan for renal biopsy but should diagnostic uncertainty arise from serologies or course could revisit the idea.  Instructed her to avoid NSAIDs.   **Proteinuria:  05/01/19 UP/ 3.15.   Presume this is manifestation of diabetic kidney disease.  Per above r/o SLE.  No RAAS inhibition while pregnant; long term should use when able.  Avoid NSAIDs.   **HTN:  Mild, likely has renovascular HTN related to diabetic kidney disease.  Will defer antiHTN medication choice to [facility] providers.    **DM:  Long discussion re: need for optimal diabetic control both now in early pregnancy and for her long term health.   **Early gestation:  Briefly discussed fetal and maternal risks for DM, kidney disease including worsening renal function, HTN, risk for pre/eclampsia, SGA baby, miscarriage.  Per OB.   I will sign off.  Plan to have her f/u with Dr. Carolin Sicks in clinic in about 4 weeks.  Please call us with any issues we can help with in the meantime.   Justin Mend 05/01/2019, 4:04 PM

## 2019-05-01 NOTE — Progress Notes (Signed)
Food tray was delivered at bedside and patient was unable to eat at that time due to increased nausea, insulin for meal coverage held at this time. Zofran given at 1500, instructed to call out when she starts eating her meal.

## 2019-05-01 NOTE — Progress Notes (Signed)
Consulted Dr. Carolin Sicks with Nephrology. He requested a Renal panel, Total Protein Creatinine Ratio and Urinalysis, which have been ordered. Plans to see pt this afternoon. RN informed.

## 2019-05-02 LAB — GLUCOSE, CAPILLARY
Glucose-Capillary: 99 mg/dL (ref 70–99)
Glucose-Capillary: 184 mg/dL — ABNORMAL HIGH (ref 70–99)
Glucose-Capillary: 128 mg/dL — ABNORMAL HIGH (ref 70–99)

## 2019-05-02 MED ORDER — NITROFURANTOIN MONOHYD MACRO 100 MG PO CAPS: 100.0000 mg | ORAL_CAPSULE | Freq: Two times a day (BID) | ORAL | 0 refills | Status: AC

## 2019-05-02 MED ORDER — INSULIN ASPART 100 UNIT/ML ~~LOC~~ SOLN
5.0000 [IU] | Freq: Three times a day (TID) | SUBCUTANEOUS | Status: DC
  Administered 2019-05-02: 5 [IU] via SUBCUTANEOUS

## 2019-05-02 MED ORDER — INSULIN NPH (HUMAN) (ISOPHANE) 100 UNIT/ML ~~LOC~~ SUSP: 12.0000 [IU] | Freq: Two times a day (BID) | SUBCUTANEOUS | 11 refills | Status: DC

## 2019-05-02 MED ORDER — BUTALBITAL-APAP-CAFFEINE 50-325-40 MG PO TABS: 1.0000 | ORAL_TABLET | ORAL | Status: DC | PRN

## 2019-05-02 MED ORDER — SERTRALINE HCL 25 MG PO TABS: 25.0000 mg | ORAL_TABLET | Freq: Every day | ORAL | Status: DC

## 2019-05-02 MED ORDER — BUTALBITAL-APAP-CAFFEINE 50-325-40 MG PO TABS: 1.0000 | ORAL_TABLET | ORAL | 1 refills | Status: DC | PRN

## 2019-05-02 MED ORDER — INSULIN ASPART 100 UNIT/ML ~~LOC~~ SOLN: 5.0000 [IU] | Freq: Three times a day (TID) | SUBCUTANEOUS | 11 refills | Status: DC

## 2019-05-02 MED ORDER — PROMETHAZINE HCL 12.5 MG PO TABS: 12.5000 mg | ORAL_TABLET | Freq: Four times a day (QID) | ORAL | 1 refills | Status: DC | PRN

## 2019-05-02 MED ORDER — INSULIN ASPART 100 UNIT/ML ~~LOC~~ SOLN
0.0000 [IU] | Freq: Three times a day (TID) | SUBCUTANEOUS | Status: DC
  Administered 2019-05-02: 3 [IU] via SUBCUTANEOUS
  Administered 2019-05-02: 2 [IU] via SUBCUTANEOUS

## 2019-05-02 NOTE — Discharge Planning (Addendum)
[facility] consulted regarding medication assistance for insulin.  Klemme  working with DM Coordinator to locate most affordable option for pt.  Will update chart when information is available.

## 2019-05-02 NOTE — Progress Notes (Signed)
Pt discharged to home with significant other.  Condition stable.  Pt ambulated to car with Aurea Graff, RN.    Pt home with NPH vial from her medication drawer.  Clarified that pt's preferred pharmacy is [facility] on [address] in [place] and not [facility] on [address] in [place] as previously in her chart. Preferred pharmacy updated in pt's chart. Per Dr.Varnado, Bronson Curb (Diabetes Coordinator) would see that patient's prescriptions at discharge got transferred to correct pharmacy.  Pt stated that she had some Novelin insulin at home already.  Discharge instructions reviewed with pt and significant other together.  Teach back method used for pt regarding medication regimen.

## 2019-05-02 NOTE — TOC Benefit Eligibility Note (Signed)
Transition of Care [facility]) Benefit Eligibility Note    Patient Details  Name: Kathy Lewis MRN: [id number] Date of Birth: 1996/05/12   Medication/Dose: Humalog and Levemir  Covered?: Yes  Tier: 2 Drug     Spoke with Person/Company/Phone Number:: Checked online  Co-Pay: 30% of total cost  Prior Approval: No     Additional Notes: All require prior auth, unable to give co-pay without authorization    Fuller Mandril, RN Phone Number: 05/02/2019, 3:44 PM

## 2019-05-02 NOTE — TOC Benefit Eligibility Note (Signed)
Transition of Care [facility]) Benefit Eligibility Note    Patient Details  Name: Kathy Lewis MRN: [id number] Date of Birth: 02-15-1996   Medication/Dose: [name], Novolog, Novolin  Covered?: No       Spoke with Person/Company/Phone Number:: [employer] Rep/ double checked online     Prior Approval: Yes     Additional Notes: All require prior auth, unable to give co-pay without authorization    Delorse Lek Phone Number: 05/02/2019, 2:58 PM

## 2019-05-02 NOTE — TOC Benefit Eligibility Note (Signed)
Transition of Care [facility]) Benefit Eligibility Note    Patient Details  Name: Kathy Lewis MRN: [id number] Date of Birth: September 13, 1995   Medication/Dose: Humalog and Levemir  Covered?: Yes  Tier: 2 Drug     Spoke with Person/Company/Phone Number:: Checked online  Co-Pay: 30% of total cost  Prior Approval: No     Additional Notes: All require prior auth, unable to give co-pay without authorization    Delorse Lek Phone Number: 05/02/2019, 3:01 PM

## 2019-05-02 NOTE — Discharge Instructions (Signed)
Local Endocrinologists [facility] ([phone]) 1. Dr. Cristina Gherghe 2. Dr. Ajay Kumar [facility] ([phone]) 1. Dr. Jeffrey Kerr [facility] ([phone]) 1. Dr. Bindubal Balan 2. Dr. Walter Kohut [facility] ([phone]) 1. Dr. Stephen [facility] [phone]) [[place] office]  ([phone]) [[place] office] 1. Dr. Melissa Solum 2. Dr. Thomas O'Connell [facility] ([facility]) ([phone]) 1. Autumn Hudnall ([place]), PA 2. Dr. Dhaval Patel 3. Dr. William Smith Jr. [facility] ([phone]) 1. Dr. Gebre Nida [facility] [phone]) 1. Dr. Micheal Brennan 2. Dr. Jennifer Badik 3. Dr. Ashley Jessup 4. Spencer Beasley, FNP Dr. Monica E. Doerr in [place] ([phone]) 

## 2019-05-02 NOTE — Progress Notes (Addendum)
Inpatient Diabetes Program Recommendations  AACE/ADA: New Consensus Statement on Inpatient Glycemic Control (2015)  Target Ranges:  Prepandial:   less than 140 mg/dL      Peak postprandial:   less than 180 mg/dL (1-2 hours)      Critically ill patients:  140 - 180 mg/dL   Lab Results  Component Value Date   GLUCAP 99 05/02/2019   HGBA1C 12.1 (H) 12/18/2014    Review of Glycemic Control Results for Kathy Lewis, Kathy Lewis (MRN [id number]) as of 05/02/2019 08:56  Ref. Range 05/01/2019 13:59 05/01/2019 14:29 05/01/2019 18:16 05/01/2019 20:51 05/02/2019 06:17  Glucose-Capillary Latest Ref Range: 70 - 99 mg/dL 63 (L) 109 (H) 180 (H) 110 (H) 99   Diabetes history: Type 1 DM Outpatient Diabetes medications: NPH 12 units BID, Novolin R 1-10 units TID Current orders for Inpatient glycemic control: NPH 12 units BID, Novolog 0-16 units TID, 0-6 units TID  Inpatient Diabetes Program Recommendations:    Noted patient experienced hypoglycemic episode of 63 mg/dL. This was following 6 units of Novolog and 12 units of NPH. Assuming this was related to correction scale, in addition to increasing creatine.  Consider decreasing correction to Novolog 0-14 units TID (AFTER meals, under diabetic pregnant patient order set).  RN paged regarding AM FSBG and administration of meal coverage. Order is not specific to units per gram of CHO. Consider changing meal coverage to Novolog 5 units TID (assuming patient is consuming >50% of meals). Usually patient's at this gestation are on a 1 units per 9 grams of CHO, so this may need to be adjusted as necessary.  Spoke with patient regarding outpatient diabetes management. Patient has not been followed by endocrinology since 2014. In speaking with the patient, she has not been taking NPH at home and usually takes Regular by estimation of CHO. Last A1C 12.9% on 10/24/2018, per MD note 11.4%. Reviewed patient's previous A1c of 12.9% & 11.4%. Explained what a A1c is and what it measures. Also  reviewed goal A1c with patient, importance of good glucose control @ home, and blood sugar goals. Reviewed patho of DM, validity of A1C in pregnancy, stressed the need for patient to be followed with PCP or endo for DM management, impact to poor glycemic control in pregnancy, vascular changes and co-morbidies. Stressed the importance of having tighter control in pregnancy and associated risks.. Patient needs close follow up with PCP and has expressed difficulty affording insulins. Reviewed Relion products, which patient is already using, however, is only able to afford 1 vial per month. Reviewed online coupon savings cards per medication, however, patient reports being denied. Will place case management consult.  Will attach endocrinology list on discharge summary.  Addendum@ 1300- Spoke with Dr Simona Huh regarding recommendations, orders updated. Informed of plan regarding CM consult and attempt to get insulins at reduced cost.  @1530 : Spoke with patient again to review the process for obtaining insulins. Education provided on process of authorization for insurance and possible associated costs. If able to obtain authorization for insulin, this could cause her insulin to become a tier 1- $10 per medication. Co-pay card should then work, given authorization.   @1600 -Discussed plan for discharge with Dr Simona Huh and RNs to include the need to send patient home with NPH & Novolog vials to last her until she gets prior authorization for her insulins. Confirmed pharmacy with Dr Simona Huh. MD to place orders for vials and begin process for authorization. Patient to follow up with OB and endo.   Thanks, Bronson Curb,  MSN, RNC-OB Diabetes Coordinator [phone] (8a-5p)

## 2019-05-02 NOTE — Progress Notes (Signed)
Pt ID :HD #2 at 5 wks and 6 days with uncontrolled type 1 DM  Subjective: Pt reports nausea, no recent emesis.  Controlled with Zofran.  Pt states appetite has decreased only since she has been admitted.  Pt understands diabetic teaching she has received in the hospital.   Pt requests referral to Neurology due to chronic headaches.  They started in middle school.  Pt has had workups in the past but has never seen Neurology.  Reports she has visual changes with the headaches.  Nephrologist expressed concern about daily use of Tylenol and NSAIDs. Denies vaginal bleeding or cramping.   Objective: I have reviewed patient's vital signs, intake and output, medications and labs. Vitals:   05/01/19 1930 05/02/19 0616 05/02/19 0823 05/02/19 1125  BP:  129/87 128/83 114/68  Pulse: 92 88 92 96  Resp: 18 18 16 18   Temp: 99 F (37.2 C) 98.7 F (37.1 C) 98.4 F (36.9 C) 98.4 F (36.9 C)  TempSrc: Oral Oral Oral Oral  SpO2: 100% 100% 98% 98%  Weight:      Height:        Gen:  NAD, A&O x 3 Ext:  No edema of calf tenderness.   Results for orders placed or performed during the hospital encounter of 04/30/19 (from the past 24 hour(s))  Glucose, capillary     Status: Abnormal   Collection Time: 05/01/19  1:59 PM  Result Value Ref Range   Glucose-Capillary 63 (L) 70 - 99 mg/dL   Comment 1 Notify RN    Comment 2 Document in Chart   Glucose, capillary     Status: Abnormal   Collection Time: 05/01/19  2:29 PM  Result Value Ref Range   Glucose-Capillary 109 (H) 70 - 99 mg/dL  Glucose, capillary     Status: Abnormal   Collection Time: 05/01/19  6:16 PM  Result Value Ref Range   Glucose-Capillary 180 (H) 70 - 99 mg/dL   Comment 1 Notify RN    Comment 2 Document in Chart   Glucose, capillary     Status: Abnormal   Collection Time: 05/01/19  8:51 PM  Result Value Ref Range   Glucose-Capillary 110 (H) 70 - 99 mg/dL  Glucose, capillary     Status: None   Collection Time: 05/02/19  6:17 AM   Result Value Ref Range   Glucose-Capillary 99 70 - 99 mg/dL  Glucose, capillary     Status: Abnormal   Collection Time: 05/02/19 11:23 AM  Result Value Ref Range   Glucose-Capillary 184 (H) 70 - 99 mg/dL   Comment 1 Notify RN    BMET    Component Value Date/Time   NA 135 05/01/2019 0948   K 3.9 05/01/2019 0948   CL 104 05/01/2019 0948   CO2 24 05/01/2019 0948   GLUCOSE 207 (H) 05/01/2019 0948   BUN 14 05/01/2019 0948   CREATININE 1.27 (H) 05/01/2019 0948   CALCIUM 8.5 (L) 05/01/2019 0948   GFRNONAA 59 (L) 05/01/2019 0948   GFRAA >60 05/01/2019 0948   PCR ~3.0  Assessment/Plan: IUP @ 5 6/7 weeks with Fetal bradycardia             No s/sxs of threatened abortion.  Type I DM with Nephropathy, uncontrolled.  Some hypoglycemia, which may be due to overcorrection and po intake.   NPH 12 units BID (8 am, 10 pm)    Change Novalog to 5 units AC meals then dosing based on carbs.  Sliding scale postprandial prn.                         CBGs  Fasting and 2 hours postprandial                         Opthalmology exam as an outpatient.                         Reestablish care with endocrinology upon discharge.   Nephropathy, newly diagnosed.    Appreciate nephrology consult.   F/u labs to R/o Lupus as ordered by Nephrology.   Pt already has appt with them outpatient.   Agree with holding NSAIDs.  N/V of pregnancy.   Send in rx for Diclegis and Zofran at discharge.  Pt instructed on how to take Diclegis.  Chronic headaches.-Likely migraines.   Refer to Neurology.                          Send in [place].  Normotensive.  Only 1 high BP since admission.   Continue to follow closely.  UTI-Remote H/o Pyelonephritis             -Macrobid 100 mg po BID x 7 days.  Continue outpatient.  Anxiety-Continue Zoloft.  Anemia             PNV with iron.  Add in iron supplement as tolerated.   HSV 2- Asymptomatic currently.  Rh Negative   [name] 05/02/2019, 1:13 PM

## 2019-05-03 LAB — ANA W/REFLEX IF POSITIVE: Anti Nuclear Antibody (ANA): NEGATIVE

## 2019-05-04 LAB — C3 COMPLEMENT: C3 Complement: 149 mg/dL (ref 82–167)

## 2019-05-04 LAB — C4 COMPLEMENT: Complement C4, Body Fluid: 31 mg/dL (ref 14–44)

## 2019-05-08 ENCOUNTER — Other Ambulatory Visit: Payer: Self-pay | Admitting: Obstetrics and Gynecology

## 2019-05-08 ENCOUNTER — Other Ambulatory Visit (HOSPITAL_COMMUNITY)
Admission: RE | Admit: 2019-05-08 | Discharge: 2019-05-08 | Disposition: A | Discharge status: Home / Self Care | Payer: Managed Care, Other (non HMO) | Source: Ambulatory Visit | Attending: Obstetrics and Gynecology | Admitting: Obstetrics and Gynecology

## 2019-05-08 DIAGNOSIS — Z3481 Encounter for supervision of other normal pregnancy, first trimester: Secondary | ICD-10-CM | POA: Insufficient documentation

## 2019-05-09 LAB — CYTOLOGY - PAP: Diagnosis: NEGATIVE

## 2019-05-29 ENCOUNTER — Encounter (HOSPITAL_COMMUNITY): Payer: Self-pay | Admitting: *Deleted

## 2019-06-02 NOTE — Discharge Summary (Signed)
Physician Discharge Summary  Patient ID: Kathy Lewis MRN: [id number] DOB/AGE: 11/24/1995 23 y.o.  Admit date: 04/30/2019 Discharge date: 06/02/2019  Admission Diagnoses:  Discharge Diagnoses:  Active Problems:   Pre-existing type 1 diabetes mellitus with hyperglycemia during pregnancy in first trimester [facility])   Discharged Condition: Improved  Hospital Course: Pt admitted ~ 5 weeks. Pt was admitted to quickly get pt started on an Insulin regimen to manage diabetes due to early pregnancy.  Blood sugar was very poorly controlled, some readings were up to 400.  Pt was started on NPH and Novolog.  Fastings 4 times daily were performed.  Insulin regimen was determined with the help of the diabetic coordinator. Pt was started on NPH q 12 hours and Novalog AC breakfast, lunch and dinner. Nephrology evaluated pt and reordered labs. Pt had UTI.  Started on [facility] inpatient.  Pt was provided NPH at discharge.  Unable to get Novalog dispensed.  Consults: nephrology and Diabetic educator  Significant Diagnostic Studies: labs  Treatments: Insulin  Discharge Exam: Blood pressure 128/77, pulse 89, temperature 98.6 F (37 C), temperature source Oral, resp. rate 16, height 5' (1.524 m), weight 68.9 kg, last menstrual period 03/14/2019, SpO2 100 %. Gen:  NAD  Abd:  Nontender, nondistended. Ext:  No calf tenderness or edema.   Disposition:   Discharge Instructions    Discharge activity:  No Restrictions   Complete by: As directed    Discharge diet:   Complete by: As directed    Diabetic diet   No sexual activity restrictions   Complete by: As directed      Allergies as of 05/02/2019   No Known Allergies     Medication List    STOP taking these medications   insulin regular 100 units/mL injection Commonly known as: NOVOLIN R     TAKE these medications   acetaminophen 500 MG tablet Commonly known as: TYLENOL Take 500 mg by mouth every 6 (six) hours as needed for mild pain.    butalbital-acetaminophen-caffeine 50-325-40 MG tablet Commonly known as: FIORICET Take 1 tablet by mouth every 4 (four) hours as needed for headache or migraine.   insulin aspart 100 UNIT/ML injection Commonly known as: novoLOG Inject 5 Units into the skin 3 (three) times daily with meals.   insulin NPH Human 100 UNIT/ML injection Commonly known as: NOVOLIN N Inject 0.12 mLs (12 Units total) into the skin 2 (two) times daily at 8 am and 10 pm.   multivitamin-prenatal 27-0.8 MG Tabs tablet Take 1 tablet by mouth daily at 12 noon.   promethazine 12.5 MG tablet Commonly known as: PHENERGAN Take 1 tablet (12.5 mg total) by mouth every 6 (six) hours as needed for nausea or vomiting.   sertraline 25 MG tablet Commonly known as: ZOLOFT Take 1 tablet (25 mg total) by mouth daily.     ASK your doctor about these medications   nitrofurantoin (macrocrystal-monohydrate) 100 MG capsule Commonly known as: MACROBID Take 1 capsule (100 mg total) by mouth every 12 (twelve) hours for 6 days. Ask about: Should I take this medication?      Follow-up Information    [name], MD. Schedule an appointment as soon as possible for a visit in 1 week(s).   Specialty: Obstetrics and Gynecology Why: Hospital follow up. Contact information: [address] [place] [address] [phone]           Signed: [name] 06/02/2019, 12:38 PM

## 2019-06-03 ENCOUNTER — Other Ambulatory Visit: Payer: Self-pay

## 2019-06-03 ENCOUNTER — Ambulatory Visit (HOSPITAL_COMMUNITY): Payer: Managed Care, Other (non HMO)

## 2019-06-03 ENCOUNTER — Encounter (HOSPITAL_COMMUNITY): Payer: Self-pay

## 2019-06-03 ENCOUNTER — Ambulatory Visit (HOSPITAL_COMMUNITY)
Admission: RE | Admit: 2019-06-03 | Discharge: 2019-06-03 | Disposition: A | Discharge status: Home / Self Care | Payer: Managed Care, Other (non HMO) | Source: Ambulatory Visit | Attending: Obstetrics and Gynecology | Admitting: Obstetrics and Gynecology

## 2019-06-03 ENCOUNTER — Other Ambulatory Visit (HOSPITAL_COMMUNITY): Payer: Self-pay | Admitting: *Deleted

## 2019-06-03 ENCOUNTER — Other Ambulatory Visit (HOSPITAL_COMMUNITY): Payer: Self-pay | Admitting: Obstetrics

## 2019-06-03 ENCOUNTER — Ambulatory Visit (HOSPITAL_BASED_OUTPATIENT_CLINIC_OR_DEPARTMENT_OTHER): Payer: Managed Care, Other (non HMO) | Admitting: *Deleted

## 2019-06-03 VITALS — BP 123/92 | HR 91 | Temp 99.2°F | Wt 158.6 lb

## 2019-06-03 DIAGNOSIS — O24011 Pre-existing diabetes mellitus, type 1, in pregnancy, first trimester: Secondary | ICD-10-CM

## 2019-06-03 DIAGNOSIS — O24311 Unspecified pre-existing diabetes mellitus in pregnancy, first trimester: Secondary | ICD-10-CM | POA: Diagnosis not present

## 2019-06-03 DIAGNOSIS — Z363 Encounter for antenatal screening for malformations: Secondary | ICD-10-CM

## 2019-06-03 DIAGNOSIS — E1021 Type 1 diabetes mellitus with diabetic nephropathy: Secondary | ICD-10-CM | POA: Insufficient documentation

## 2019-06-03 DIAGNOSIS — Z3A11 11 weeks gestation of pregnancy: Secondary | ICD-10-CM

## 2019-06-03 DIAGNOSIS — Z3A10 10 weeks gestation of pregnancy: Secondary | ICD-10-CM

## 2019-06-03 DIAGNOSIS — Z794 Long term (current) use of insulin: Secondary | ICD-10-CM | POA: Diagnosis present

## 2019-06-03 DIAGNOSIS — O24319 Unspecified pre-existing diabetes mellitus in pregnancy, unspecified trimester: Secondary | ICD-10-CM

## 2019-06-03 DIAGNOSIS — E1065 Type 1 diabetes mellitus with hyperglycemia: Secondary | ICD-10-CM

## 2019-06-03 DIAGNOSIS — Z9114 Patient's other noncompliance with medication regimen: Secondary | ICD-10-CM

## 2019-06-03 NOTE — Progress Notes (Signed)
MFM Consult   This patient was seen in consultation at the request of Dr. Simona Huh due to uncontrolled type 1 diabetes.  The patient reports that she was diagnosed with type 1 diabetes about 15 years ago.  The patient was recently hospitalized as her blood sugars were as high as the 400s range.  While in the hospital, she was started on NPH and NovoLog insulin.  The patient reports that her fingerstick values are now under better control since being put on her current insulin regimen.  Her hemoglobin A1c at the time of conception was 11.6%.    A 24-hour urine collected during her hospitalization showed 5021 mg of protein.  There was also a creatinine clearance that was lower than expected.  Her serum creatinine level was 1.27 one month ago.  A renal consult performed at the time of her admission stated that the patient has presumed diabetic nephropathy, as lupus was essentially ruled out as a cause of the nephropathy based on negative C3, C4, and negative ANA tests.  The patient reports that she is scheduled to see an endocrinologist to help with the management of her diabetes later this week.  She is scheduled to have an eye exam to screen for diabetic retinopathy early next week.  She reports that her blood pressures are within normal limits.  The crown-rump length measured today is consistent with her gestational age, giving her an [facility] of December 26, 2019.   The implications and management of diabetes in pregnancy was discussed in detail with the patient.  The patient was advised that due to her significantly elevated blood glucose levels early in her current pregnancy, that her fetus is at increased risk for birth defects including cardiac defects.  Due to diabetes and diabetic nephropathy, she will also be at increased risk for miscarriage, fetal growth issues, early onset severe preeclampsia, an indicated preterm birth, and even an IUFD.  She was advised that our goals for her fingerstick values are  fasting values of 90-95 or less and two-hour postprandials of 120 or less.  Should the majority of her fingerstick values be above these values, her insulin regimen may need to be adjusted to help her achieve better glycemic control. The patient was advised that getting her fingerstick values as close to these goals as possible would provide her with the most optimal obstetrical outcome.  The patient was advised that diabetic nephropathy probably will increase her risk of IUGR and the development of preeclampsia.  To decrease her risk of preeclampsia in her current pregnancy, the patient was advised to start taking a daily baby aspirin (81 mg daily) starting next week.  She was advised to continue taking a daily baby aspirin for preeclampsia prophylaxis for the duration of her pregnancy.  The patient was advised that due to diabetes in pregnancy, we will continue to follow her with serial ultrasounds.  A fetal anatomy scan has been scheduled at around 19 weeks.  A fetal echocardiogram will also be scheduled 3-4 weeks after her fetal anatomy scan.  The patient should then continue to be followed with serial growth ultrasounds.  Twice-weekly fetal testing should be started at around 32 weeks.  Due to pre-gestational diabetes with diabetic nephropathy, I would recommend that she be delivered at around 80 weeks.  An earlier delivery would be indicated should she develop preeclampsia or any other pregnancy complications.  The patient should also have a baseline EKG performed to rule out any cardiac sequelae due to her longstanding  diabetes.  The patient met with our genetic counselor following today's consultation to have a cell free DNA test drawn for screening of fetal aneuploidy.  Our genetic counselor will notify the patient regarding the cell free DNA test results. At the end of the consultation, the patient stated that all her questions had been answered to her complete satisfaction.  A total of 60  minutes was spent counseling and coordinating the care for this patient.  Greater than 50% of the time was spent in direct face-to-face contact.  Recommendations Baseline EKG Continue the patient's insulin regimen to help her achieve optimal glycemic control.  Her insulin regimen should be adjusted as needed. Endocrine consult as planned. Continued follow-up with nephrology. Start daily baby aspirin next week for preeclampsia prophylaxis. Fetal anatomy scan at around 19 weeks. Fetal echocardiogram at around 22 weeks. Serial growth ultrasounds. Twice weekly fetal testing to start at around 32 weeks. Delivery at around 37 weeks or earlier based on clinical status.

## 2019-06-06 ENCOUNTER — Other Ambulatory Visit (HOSPITAL_COMMUNITY): Payer: Self-pay | Admitting: Genetic Counselor

## 2019-06-09 ENCOUNTER — Telehealth (HOSPITAL_COMMUNITY): Payer: Self-pay | Admitting: Genetic Counselor

## 2019-06-09 NOTE — Telephone Encounter (Signed)
Attempted to call Ms. Hocevar re: good news about NIPS results. However, her mailbox was full and I was unable to leave a message. I will try again later.   Buelah Manis, MS Genetic Counselor

## 2019-06-17 ENCOUNTER — Encounter (HOSPITAL_COMMUNITY): Payer: Self-pay | Admitting: *Deleted

## 2019-06-17 ENCOUNTER — Other Ambulatory Visit: Payer: Self-pay

## 2019-06-17 ENCOUNTER — Inpatient Hospital Stay (HOSPITAL_COMMUNITY)
Admission: AD | Admit: 2019-06-17 | Discharge: 2019-06-17 | Disposition: A | Discharge status: Home / Self Care | Payer: Managed Care, Other (non HMO) | Attending: Obstetrics and Gynecology | Admitting: Obstetrics and Gynecology

## 2019-06-17 DIAGNOSIS — O24011 Pre-existing diabetes mellitus, type 1, in pregnancy, first trimester: Secondary | ICD-10-CM | POA: Insufficient documentation

## 2019-06-17 DIAGNOSIS — O99012 Anemia complicating pregnancy, second trimester: Secondary | ICD-10-CM | POA: Insufficient documentation

## 2019-06-17 DIAGNOSIS — Z3A12 12 weeks gestation of pregnancy: Secondary | ICD-10-CM | POA: Diagnosis not present

## 2019-06-17 DIAGNOSIS — O210 Mild hyperemesis gravidarum: Secondary | ICD-10-CM | POA: Diagnosis not present

## 2019-06-17 DIAGNOSIS — E1065 Type 1 diabetes mellitus with hyperglycemia: Secondary | ICD-10-CM | POA: Diagnosis not present

## 2019-06-17 DIAGNOSIS — E160 Drug-induced hypoglycemia without coma: Secondary | ICD-10-CM

## 2019-06-17 DIAGNOSIS — T383X5A Adverse effect of insulin and oral hypoglycemic [antidiabetic] drugs, initial encounter: Secondary | ICD-10-CM

## 2019-06-17 DIAGNOSIS — O99011 Anemia complicating pregnancy, first trimester: Secondary | ICD-10-CM

## 2019-06-17 DIAGNOSIS — O219 Vomiting of pregnancy, unspecified: Secondary | ICD-10-CM

## 2019-06-17 DIAGNOSIS — Z794 Long term (current) use of insulin: Secondary | ICD-10-CM | POA: Diagnosis not present

## 2019-06-17 DIAGNOSIS — D509 Iron deficiency anemia, unspecified: Secondary | ICD-10-CM | POA: Diagnosis not present

## 2019-06-17 LAB — GLUCOSE, CAPILLARY
Glucose-Capillary: 167 mg/dL — ABNORMAL HIGH (ref 70–99)
Glucose-Capillary: 84 mg/dL (ref 70–99)
Glucose-Capillary: 30 mg/dL — CL (ref 70–99)
Glucose-Capillary: 67 mg/dL — ABNORMAL LOW (ref 70–99)
Glucose-Capillary: 82 mg/dL (ref 70–99)

## 2019-06-17 LAB — COMPREHENSIVE METABOLIC PANEL
ALT: 7 U/L (ref 0–44)
AST: 12 U/L — ABNORMAL LOW (ref 15–41)
Albumin: 2.5 g/dL — ABNORMAL LOW (ref 3.5–5.0)
Alkaline Phosphatase: 48 U/L (ref 38–126)
Anion gap: 6 (ref 5–15)
BUN: 15 mg/dL (ref 6–20)
CO2: 25 mmol/L (ref 22–32)
Calcium: 8.1 mg/dL — ABNORMAL LOW (ref 8.9–10.3)
Chloride: 105 mmol/L (ref 98–111)
Creatinine, Ser: 1.55 mg/dL — ABNORMAL HIGH (ref 0.44–1.00)
GFR calc Af Amer: 54 mL/min — ABNORMAL LOW (ref >60)
GFR calc non Af Amer: 47 mL/min — ABNORMAL LOW (ref >60)
Glucose, Bld: 174 mg/dL — ABNORMAL HIGH (ref 70–99)
Potassium: 3.9 mmol/L (ref 3.5–5.1)
Sodium: 136 mmol/L (ref 135–145)
Total Bilirubin: 0.1 mg/dL — ABNORMAL LOW (ref 0.3–1.2)
Total Protein: 5.9 g/dL — ABNORMAL LOW (ref 6.5–8.1)

## 2019-06-17 LAB — URINALYSIS, ROUTINE W REFLEX MICROSCOPIC
Bilirubin Urine: NEGATIVE
Glucose, UA: 50 mg/dL — AB
Ketones, ur: NEGATIVE mg/dL
Leukocytes,Ua: NEGATIVE
Nitrite: NEGATIVE
Protein, ur: >=300 mg/dL — AB
Specific Gravity, Urine: 1.012 (ref 1.005–1.030)
pH: 6 (ref 5.0–8.0)

## 2019-06-17 LAB — BLOOD GAS, ARTERIAL
Acid-base deficit: 0.2 mmol/L (ref 0.0–2.0)
Bicarbonate: 23.8 mmol/L (ref 20.0–28.0)
Drawn by: 33098
FIO2: 0.21
O2 Saturation: 97 %
pCO2 arterial: 38.3 mmHg (ref 32.0–48.0)
pH, Arterial: 7.41 (ref 7.350–7.450)
pO2, Arterial: 96.5 mmHg (ref 83.0–108.0)

## 2019-06-17 LAB — CBC
HCT: 24.1 % — ABNORMAL LOW (ref 36.0–46.0)
Hemoglobin: 8.8 g/dL — ABNORMAL LOW (ref 12.0–15.0)
MCH: 34 pg (ref 26.0–34.0)
MCHC: 36.5 g/dL — ABNORMAL HIGH (ref 30.0–36.0)
MCV: 93.1 fL (ref 80.0–100.0)
Platelets: 247 10*3/uL (ref 150–400)
RBC: 2.59 MIL/uL — ABNORMAL LOW (ref 3.87–5.11)
RDW: 12 % (ref 11.5–15.5)
WBC: 6 10*3/uL (ref 4.0–10.5)
nRBC: 0 % (ref 0.0–0.2)

## 2019-06-17 MED ORDER — SODIUM CHLORIDE 0.9 % IV BOLUS
1000.0000 mL | Freq: Once | INTRAVENOUS | Status: AC
  Administered 2019-06-17: 1000 mL via INTRAVENOUS

## 2019-06-17 MED ORDER — INSULIN NPH (HUMAN) (ISOPHANE) 100 UNIT/ML ~~LOC~~ SUSP: 15.0000 [IU] | Freq: Two times a day (BID) | SUBCUTANEOUS | 11 refills | Status: DC

## 2019-06-17 MED ORDER — INSULIN ASPART 100 UNIT/ML ~~LOC~~ SOLN: 10.0000 [IU] | Freq: Three times a day (TID) | SUBCUTANEOUS | 11 refills | Status: DC

## 2019-06-17 MED ORDER — INSULIN ASPART 100 UNIT/ML ~~LOC~~ SOLN
10.0000 [IU] | Freq: Once | SUBCUTANEOUS | Status: AC
  Administered 2019-06-17: 10 [IU] via SUBCUTANEOUS

## 2019-06-17 MED ORDER — INSULIN ASPART 100 UNIT/ML ~~LOC~~ SOLN: 5.0000 [IU] | Freq: Three times a day (TID) | SUBCUTANEOUS | 11 refills | Status: DC

## 2019-06-17 MED ORDER — ONDANSETRON HCL 4 MG PO TABS: 4.0000 mg | ORAL_TABLET | Freq: Three times a day (TID) | ORAL | 2 refills | Status: DC | PRN

## 2019-06-17 MED ORDER — M.V.I. ADULT IV INJ
Freq: Once | INTRAVENOUS | Status: AC
  Administered 2019-06-17: 14:00:00 via INTRAVENOUS
  Filled 2019-06-17: qty 1000

## 2019-06-17 MED ORDER — ONDANSETRON HCL 4 MG PO TABS
8.0000 mg | ORAL_TABLET | Freq: Once | ORAL | Status: AC
  Administered 2019-06-17: 8 mg via ORAL
  Filled 2019-06-17: qty 2

## 2019-06-17 NOTE — MAU Provider Note (Signed)
Chief Complaint: Nausea and Hyperglycemia   First Provider Initiated Contact with Patient 06/17/19 1108     SUBJECTIVE HPI: Kathy Lewis is a 23 y.o. G2P0010 at [redacted]w[redacted]d who sent to Maternity Admissions by Dr. Simona Huh for possible DKA. Blood sugar elevated 195's, 240 last two days. Nausea throughout pregnancy w/ rare vomiting. None today, but has very poor appetite, low energy. Doesn't even get out of bad some days. Only eats occasional fruit due to low energy and nausea. Doesn't have energy to prepare food. Taking Diclegis. Rare vomiting, but still significant nausea.   Associated signs and symptoms: Neg for fever, chills, abd pain, vag bleeding, mental status change.   Type 2 DM Dx ~ age 54. Goes to Dr. Soyla Murphy.   Took NPH 15 this am. Did not take Novolog 10 mg this am due to no PO intake.   Hx IDA. Not on supplement at this time.     Past Medical History:  Diagnosis Date  . Chronic kidney disease (CKD), stage II (mild)   . Diabetes mellitus without complication ([place])    OB History  Gravida Para Term Preterm AB Living  2       1    SAB TAB Ectopic Multiple Live Births  1            # Outcome Date GA Lbr Len/2nd Weight Sex Delivery Anes PTL Lv  2 Current           1 SAB            Past Surgical History:  Procedure Laterality Date  . NO PAST SURGERIES     Social History   Socioeconomic History  . Marital status: Single    Spouse name: Not on file  . Number of children: Not on file  . Years of education: Not on file  . Highest education level: Not on file  Occupational History  . Not on file  Social Needs  . Financial resource strain: Not on file  . Food insecurity    Worry: Not on file    Inability: Not on file  . Transportation needs    Medical: Not on file    Non-medical: Not on file  Tobacco Use  . Smoking status: Former Research scientist (life sciences)  . Smokeless tobacco: Never Used  Substance and Sexual Activity  . Alcohol use: No  . Drug use: No  . Sexual activity: Yes    Birth  control/protection: None  Lifestyle  . Physical activity    Days per week: Not on file    Minutes per session: Not on file  . Stress: Not on file  Relationships  . Social Herbalist on phone: Not on file    Gets together: Not on file    Attends religious service: Not on file    Active member of club or organization: Not on file    Attends meetings of clubs or organizations: Not on file    Relationship status: Not on file  . Intimate partner violence    Fear of current or ex partner: Not on file    Emotionally abused: Not on file    Physically abused: Not on file    Forced sexual activity: Not on file  Other Topics Concern  . Not on file  Social History Narrative  . Not on file   History reviewed. No pertinent family history. No current facility-administered medications on file prior to encounter.    Current Outpatient Medications on File Prior  to Encounter  Medication Sig Dispense Refill  . acetaminophen (TYLENOL) 500 MG tablet Take 500 mg by mouth every 6 (six) hours as needed for mild pain.    . butalbital-acetaminophen-caffeine (FIORICET) 50-325-40 MG tablet Take 1 tablet by mouth every 4 (four) hours as needed for headache or migraine. 30 tablet 1  . Prenatal Vit-Fe Fumarate-FA (MULTIVITAMIN-PRENATAL) 27-0.8 MG TABS tablet Take 1 tablet by mouth daily at 12 noon.    . promethazine (PHENERGAN) 12.5 MG tablet Take 1 tablet (12.5 mg total) by mouth every 6 (six) hours as needed for nausea or vomiting. 30 tablet 1  . sertraline (ZOLOFT) 25 MG tablet Take 1 tablet (25 mg total) by mouth daily. (Patient not taking: Reported on 06/03/2019)     No Known Allergies  I have reviewed patient's Past Medical Hx, Surgical Hx, Family Hx, Social Hx, medications and allergies.   Review of Systems  Constitutional: Positive for appetite change and fatigue. Negative for chills and fever.  Gastrointestinal: Positive for nausea. Negative for abdominal pain, diarrhea and vomiting.   Genitourinary: Negative for dysuria and vaginal bleeding.  Neurological: Positive for seizures.  Psychiatric/Behavioral: Positive for confusion.    OBJECTIVE Patient Vitals for the past 24 hrs:  BP Temp Pulse Resp SpO2 Height Weight  06/17/19 1011 116/78 98.5 F (36.9 C) 87 16 100 % 5' (1.524 m) 67.9 kg   Constitutional: Well-developed, well-nourished female in no acute distress.  Skin: Mild pallor  Cardiovascular: normal rate Respiratory: normal rate and effort.  GI: Deferred MS: Deferred Neurologic: Alert and oriented x 4.  GU: Deferred  FHR 168 by doppler  LAB RESULTS Results for orders placed or performed during the hospital encounter of 06/17/19 (from the past 24 hour(s))  CBC     Status: Abnormal   Collection Time: 06/17/19 10:28 AM  Result Value Ref Range   WBC 6.0 4.0 - 10.5 K/uL   RBC 2.59 (L) 3.87 - 5.11 MIL/uL   Hemoglobin 8.8 (L) 12.0 - 15.0 g/dL   HCT 24.1 (L) 36.0 - 46.0 %   MCV 93.1 80.0 - 100.0 fL   MCH 34.0 26.0 - 34.0 pg   MCHC 36.5 (H) 30.0 - 36.0 g/dL   RDW 12.0 11.5 - 15.5 %   Platelets 247 150 - 400 K/uL   nRBC 0.0 0.0 - 0.2 %  Comprehensive metabolic panel     Status: Abnormal   Collection Time: 06/17/19 10:28 AM  Result Value Ref Range   Sodium 136 135 - 145 mmol/L   Potassium 3.9 3.5 - 5.1 mmol/L   Chloride 105 98 - 111 mmol/L   CO2 25 22 - 32 mmol/L   Glucose, Bld 174 (H) 70 - 99 mg/dL   BUN 15 6 - 20 mg/dL   Creatinine, Ser 1.55 (H) 0.44 - 1.00 mg/dL   Calcium 8.1 (L) 8.9 - 10.3 mg/dL   Total Protein 5.9 (L) 6.5 - 8.1 g/dL   Albumin 2.5 (L) 3.5 - 5.0 g/dL   AST 12 (L) 15 - 41 U/L   ALT 7 0 - 44 U/L   Alkaline Phosphatase 48 38 - 126 U/L   Total Bilirubin 0.1 (L) 0.3 - 1.2 mg/dL   GFR calc non Af Amer 47 (L) >60 mL/min   GFR calc Af Amer 54 (L) >60 mL/min   Anion gap 6 5 - 15  Blood gas, arterial     Status: None   Collection Time: 06/17/19 10:40 AM  Result Value Ref Range  FIO2 0.21    Delivery systems ROOM AIR    pH,  Arterial 7.410 7.350 - 7.450   pCO2 arterial 38.3 32.0 - 48.0 mmHg   pO2, Arterial 96.5 83.0 - 108.0 mmHg   Bicarbonate 23.8 20.0 - 28.0 mmol/L   Acid-base deficit 0.2 0.0 - 2.0 mmol/L   O2 Saturation 97.0 %   Collection site RIGHT RADIAL    Drawn by [phone]    Sample type ARTERIAL    Allens test (pass/fail) PASS PASS  Glucose, capillary     Status: Abnormal   Collection Time: 06/17/19 10:52 AM  Result Value Ref Range   Glucose-Capillary 167 (H) 70 - 99 mg/dL  Urinalysis, Routine w reflex microscopic     Status: Abnormal   Collection Time: 06/17/19 11:05 AM  Result Value Ref Range   Color, Urine YELLOW YELLOW   APPearance CLEAR CLEAR   Specific Gravity, Urine 1.012 1.005 - 1.030   pH 6.0 5.0 - 8.0   Glucose, UA 50 (A) NEGATIVE mg/dL   Hgb urine dipstick SMALL (A) NEGATIVE   Bilirubin Urine NEGATIVE NEGATIVE   Ketones, ur NEGATIVE NEGATIVE mg/dL   Protein, ur >=300 (A) NEGATIVE mg/dL   Nitrite NEGATIVE NEGATIVE   Leukocytes,Ua NEGATIVE NEGATIVE   RBC / HPF 11-20 0 - 5 RBC/hpf   WBC, UA 0-5 0 - 5 WBC/hpf   Bacteria, UA RARE (A) NONE SEEN   Squamous Epithelial / LPF 0-5 0 - 5   Mucus PRESENT    Hyaline Casts, UA PRESENT   Glucose, capillary     Status: None   Collection Time: 06/17/19  1:40 PM  Result Value Ref Range   Glucose-Capillary 84 70 - 99 mg/dL  Glucose, capillary     Status: Abnormal   Collection Time: 06/17/19  2:42 PM  Result Value Ref Range   Glucose-Capillary 30 (LL) 70 - 99 mg/dL   Comment 1 Notify RN    Comment 2 Call MD NNP PA CNM    Comment 3 Document in Chart   Glucose, capillary     Status: Abnormal   Collection Time: 06/17/19  3:03 PM  Result Value Ref Range   Glucose-Capillary 67 (L) 70 - 99 mg/dL  Glucose, capillary     Status: None   Collection Time: 06/17/19  3:19 PM  Result Value Ref Range   Glucose-Capillary 82 70 - 99 mg/dL    IMAGING NA  MAU COURSE Orders Placed This Encounter  Procedures  . Blood gas, arterial  . CBC  .  Comprehensive metabolic panel  . Glucose, capillary  . Urinalysis, Routine w reflex microscopic  . Glucose, capillary  . Glucose, capillary  . Glucose, capillary  . Glucose, capillary  . Give 8 oz juice or regular soda  . Insert peripheral IV  . Discharge patient   Meds ordered this encounter  Medications  . sodium chloride 0.9 % bolus 1,000 mL  . lactated ringers 1,000 mL with multivitamins adult (INFUVITE ADULT) 10 mL infusion  . ondansetron (ZOFRAN) tablet 8 mg  . insulin aspart (novoLOG) injection 10 Units  . ondansetron (ZOFRAN) 4 MG tablet    Sig: Take 1 tablet (4 mg total) by mouth every 8 (eight) hours as needed for nausea or vomiting.    Dispense:  30 tablet    Refill:  2    Order Specific Question:   Supervising Provider    Answer:   Verita Schneiders A [id number]  . insulin NPH Human (NOVOLIN N) 100 UNIT/ML  injection    Sig: Inject 0.15 mLs (15 Units total) into the skin 2 (two) times daily at 8 am and 10 pm.    Dispense:  10 mL    Refill:  11    Order Specific Question:   Supervising Provider    Answer:   Verita Schneiders A [[id number]]  . insulin aspart (NOVOLOG) 100 UNIT/ML injection    Sig: Inject 10 Units into the skin 3 (three) times daily with meals.    Dispense:  10 mL    Refill:  11    Order Specific Question:   Supervising Provider    Answer:   Verita Schneiders A [id number]   Pt able to keep down down lunch of [place] sandwich, peanut butter crackers and apple sauce after Zofran. Usual Novolog 10 Units given.   CBG 84  Discussed Hx, labs, exam w/ Dr. Harolyn Rutherford. Agrees w/ POC. No evidence of DKA.  New orders: F/U 2-3 days at [facility].  [id number]: CBG checked prior to D/C. Hypoglycemic at 30. Pt reports blurry vision. Hypoglycemia protocol initiated.  1503: CBG 67. Feeling better. Nml vision.     1519: CBG  82  MDM - Hyperglycemia w/ DKA. Due to not taking AM insulin due to poor PO intake. Lengthy discussion about consistent eating.  - IDA: Restart FeSo4 BID. Increase  dietary iron. May need Iron infusion OP.  - N/V of pregnancy well controlled w/ addition of Zofran. Continue Diclegis. Zofran PRN to be able to eat well.  - Suspect depressed mood and fatigue from IDA may be contributing poor PO intake. Discussed importance of good nutrition, easy, nutritious snacks.    ASSESSMENT 1. Hyperglycemia due to type 1 diabetes mellitus ([place])   2. Type 1 diabetes mellitus affecting pregnancy in first trimester, antepartum   3. Hypoglycemia due to insulin   4. Nausea/vomiting in pregnancy   5. Anemia complicating pregnancy, first trimester     PLAN Discharge home in stable condition. Hyperglycemia, Hypoglycemia precautions Discussed importance on consistent meals, adequate PO intake.  F/U w/ OB RE: IDA F/U w/ Dr. Soyla Murphy RE: Type 1 DM Adjust insulin to PO intake.  Allergies as of 06/17/2019   No Known Allergies     Medication List    TAKE these medications   acetaminophen 500 MG tablet Commonly known as: TYLENOL Take 500 mg by mouth every 6 (six) hours as needed for mild pain.   butalbital-acetaminophen-caffeine 50-325-40 MG tablet Commonly known as: FIORICET Take 1 tablet by mouth every 4 (four) hours as needed for headache or migraine.   insulin aspart 100 UNIT/ML injection Commonly known as: novoLOG Inject 5-10 Units into the skin 3 (three) times daily with meals. Adjust per carb intake What changed:   how much to take  additional instructions   insulin NPH Human 100 UNIT/ML injection Commonly known as: NOVOLIN N Inject 0.15 mLs (15 Units total) into the skin 2 (two) times daily at 8 am and 10 pm. What changed: how much to take   multivitamin-prenatal 27-0.8 MG Tabs tablet Take 1 tablet by mouth daily at 12 noon.   ondansetron 4 MG tablet Commonly known as: ZOFRAN Take 1 tablet (4 mg total) by mouth every 8 (eight) hours as needed for nausea or vomiting.   promethazine 12.5 MG tablet Commonly known as: PHENERGAN Take 1 tablet (12.5 mg  total) by mouth every 6 (six) hours as needed for nausea or vomiting.   sertraline 25 MG tablet Commonly known as: ZOLOFT Take 1  tablet (25 mg total) by mouth daily.        Tamala Julian, [place], [place] 06/17/2019  3:44 PM

## 2019-06-17 NOTE — Progress Notes (Signed)
1345 patient given [place] sandwich and applesauce per provider, has had a couple crackers with peanut butter as well.

## 2019-06-17 NOTE — Progress Notes (Signed)
Notified by nurse at 1445 that patient's blood sugar was 30; I immediately went to room to assess patient. Patient alert and oriented times 3; eating a sandwich and checking her phone. She does not appear distressed; is conversant. Offered second glass of juice; patient says that her vision is blurry but is improving with juice.   Now at 1451, she finished her sandwich and feels much better. CNM at the bedside observing patient for now.  1505: patient BS now 85, feeling much better. Still alert and oriented times 3, using her phone and drinking juice.   Kathy Lewis

## 2019-06-17 NOTE — Progress Notes (Signed)
Patient's repeat blood sugar was 67, states her vision is no longer blurry, says "I feel fine". Provider notified and instructed to repeat the blood sugar again in 15 minutes.

## 2019-06-17 NOTE — MAU Note (Signed)
Her OB thinks she is in DKA, so she sent her over here. BS was 195, yesterday they drew blood and it was 240.  'dry mouth, nausea"

## 2019-06-17 NOTE — Progress Notes (Signed)
Pt states she felt like her sugar was dropping that her vision was a little burry, CBG was 30, provider notified, hypoglycemia protocol initiated, apple juice given to patient. Will recheck blood sugar 15 minutes from the patient drinking the juice, per provider.

## 2019-07-06 ENCOUNTER — Encounter (HOSPITAL_BASED_OUTPATIENT_CLINIC_OR_DEPARTMENT_OTHER): Payer: Self-pay

## 2019-07-06 ENCOUNTER — Emergency Department (HOSPITAL_BASED_OUTPATIENT_CLINIC_OR_DEPARTMENT_OTHER)
Admission: EM | Admit: 2019-07-06 | Discharge: 2019-07-07 | Disposition: A | Discharge status: Home / Self Care | Payer: Managed Care, Other (non HMO) | Attending: Emergency Medicine | Admitting: Emergency Medicine

## 2019-07-06 ENCOUNTER — Other Ambulatory Visit: Payer: Self-pay

## 2019-07-06 DIAGNOSIS — N182 Chronic kidney disease, stage 2 (mild): Secondary | ICD-10-CM | POA: Diagnosis not present

## 2019-07-06 DIAGNOSIS — E1122 Type 2 diabetes mellitus with diabetic chronic kidney disease: Secondary | ICD-10-CM | POA: Diagnosis not present

## 2019-07-06 DIAGNOSIS — O99810 Abnormal glucose complicating pregnancy: Secondary | ICD-10-CM

## 2019-07-06 DIAGNOSIS — O219 Vomiting of pregnancy, unspecified: Secondary | ICD-10-CM | POA: Insufficient documentation

## 2019-07-06 DIAGNOSIS — Z3A16 16 weeks gestation of pregnancy: Secondary | ICD-10-CM | POA: Diagnosis not present

## 2019-07-06 DIAGNOSIS — O24414 Gestational diabetes mellitus in pregnancy, insulin controlled: Secondary | ICD-10-CM | POA: Diagnosis not present

## 2019-07-06 LAB — CBG MONITORING, ED: Glucose-Capillary: 215 mg/dL — ABNORMAL HIGH (ref 70–99)

## 2019-07-06 NOTE — ED Triage Notes (Signed)
Pt is [redacted] weeks pregnant and has a hx juvenile type 1 DM. Pt states the past 5 days her CBG has been running in the 300-400's. Tonight pt is having N/V. Pt states, "I think I am in DKA."

## 2019-07-07 LAB — BASIC METABOLIC PANEL
Anion gap: 7 (ref 5–15)
BUN: 15 mg/dL (ref 6–20)
CO2: 22 mmol/L (ref 22–32)
Calcium: 8.2 mg/dL — ABNORMAL LOW (ref 8.9–10.3)
Chloride: 104 mmol/L (ref 98–111)
Creatinine, Ser: 1.26 mg/dL — ABNORMAL HIGH (ref 0.44–1.00)
GFR calc Af Amer: >60 mL/min (ref >60)
GFR calc non Af Amer: >60 mL/min (ref >60)
Glucose, Bld: 202 mg/dL — ABNORMAL HIGH (ref 70–99)
Potassium: 3.9 mmol/L (ref 3.5–5.1)
Sodium: 133 mmol/L — ABNORMAL LOW (ref 135–145)

## 2019-07-07 LAB — CBC WITH DIFFERENTIAL/PLATELET
Abs Immature Granulocytes: 0.03 10*3/uL (ref 0.00–0.07)
Basophils Absolute: 0 10*3/uL (ref 0.0–0.1)
Basophils Relative: 0 %
Eosinophils Absolute: 0.1 10*3/uL (ref 0.0–0.5)
Eosinophils Relative: 2 %
HCT: 23.4 % — ABNORMAL LOW (ref 36.0–46.0)
Hemoglobin: 8 g/dL — ABNORMAL LOW (ref 12.0–15.0)
Immature Granulocytes: 1 %
Lymphocytes Relative: 26 %
Lymphs Abs: 1.6 10*3/uL (ref 0.7–4.0)
MCH: 32.4 pg (ref 26.0–34.0)
MCHC: 34.2 g/dL (ref 30.0–36.0)
MCV: 94.7 fL (ref 80.0–100.0)
Monocytes Absolute: 0.2 10*3/uL (ref 0.1–1.0)
Monocytes Relative: 3 %
Neutro Abs: 4.2 10*3/uL (ref 1.7–7.7)
Neutrophils Relative %: 68 %
Platelets: 216 10*3/uL (ref 150–400)
RBC: 2.47 MIL/uL — ABNORMAL LOW (ref 3.87–5.11)
RDW: 12.7 % (ref 11.5–15.5)
WBC: 6.2 10*3/uL (ref 4.0–10.5)
nRBC: 0 % (ref 0.0–0.2)

## 2019-07-07 LAB — CBG MONITORING, ED: Glucose-Capillary: 111 mg/dL — ABNORMAL HIGH (ref 70–99)

## 2019-07-07 MED ORDER — SODIUM CHLORIDE 0.9 % IV BOLUS
1000.0000 mL | Freq: Once | INTRAVENOUS | Status: AC
  Administered 2019-07-07: 1000 mL via INTRAVENOUS

## 2019-07-07 NOTE — Discharge Instructions (Addendum)
You were seen today for high blood sugars.  Continue to monitor closely at home.  Adjust your insulin accordingly.  It is very important that you follow-up with your OB/GYN and endocrinologist regarding permanent adjustments in your insulin.  You had one blood pressure reading which was 139/92.  You need to have this rechecked very closely.  Your blood pressure dropped to a normal range without intervention prior to discharge.

## 2019-07-07 NOTE — ED Provider Notes (Signed)
[facility] EMERGENCY DEPARTMENT Provider Note   CSN: [id number] Arrival date & time: 07/06/19  2345     History   Chief Complaint Chief Complaint  Patient presents with   Hyperglycemia    HPI Kathy Lewis is a 23 y.o. female.     HPI  This is a 23 year old female with a history of chronic kidney disease and diabetes who presents with concerns for DKA.  She is currently [redacted] weeks pregnant.  She has a history of type 1 diabetes.  Reports over the last week her blood sugars have been in the 400s range.  She reports that she has not been eating much and has been adjusting her insulin but her blood sugars continue to be high.  Tonight she had some vomiting which concerned her.  Patient reports that she has not had any recent fevers, upper respiratory symptoms, abdominal pain, urinary symptoms.  She reports no pregnancy related symptoms including loss of fluids or vaginal bleeding.  She has not yet felt the baby move.  This is her first pregnancy.  She has a follow-up appointment on Tuesday with her OB/GYN.   Past Medical History:  Diagnosis Date   Chronic kidney disease (CKD), stage II (mild)    Diabetes mellitus without complication [facility])     Patient Active Problem List   Diagnosis Date Noted   Pre-existing type 1 diabetes mellitus with hyperglycemia during pregnancy in first trimester ([place]) 04/30/2019   Hypokalemia    DKA (diabetic ketoacidoses) ([place]) 12/18/2014   Leukocytosis 12/18/2014   Noncompliance with medications 12/18/2014   Type 1 diabetes mellitus with ketoacidosis, uncontrolled ([place]) 12/18/2014   Nausea and vomiting 12/18/2014    Past Surgical History:  Procedure Laterality Date   NO PAST SURGERIES       OB History    Gravida  2   Para      Term      Preterm      AB  1   Living        SAB  1   TAB      Ectopic      Multiple      Live Births               Home Medications    Prior to Admission medications     Medication Sig Start Date End Date Taking? Authorizing Provider  insulin aspart (NOVOLOG) 100 UNIT/ML injection Inject 5-10 Units into the skin 3 (three) times daily with meals. Adjust per carb intake 06/17/19  Yes [place], [place], CNM  insulin NPH Human (NOVOLIN N) 100 UNIT/ML injection Inject 0.15 mLs (15 Units total) into the skin 2 (two) times daily at 8 am and 10 pm. 06/17/19  Yes Tamala Julian, [place], CNM  ondansetron (ZOFRAN) 4 MG tablet Take 1 tablet (4 mg total) by mouth every 8 (eight) hours as needed for nausea or vomiting. 06/17/19  Yes [place], [place], [place]  Prenatal Vit-Fe Fumarate-FA (MULTIVITAMIN-PRENATAL) 27-0.8 MG TABS tablet Take 1 tablet by mouth daily at 12 noon.   Yes [provider]  acetaminophen (TYLENOL) 500 MG tablet Take 500 mg by mouth every 6 (six) hours as needed for mild pain.    [provider]  butalbital-acetaminophen-caffeine (FIORICET) 50-325-40 MG tablet Take 1 tablet by mouth every 4 (four) hours as needed for headache or migraine. 05/02/19   [name], MD  metroNIDAZOLE (FLAGYL) 500 MG tablet Take 2,000 mg by mouth once. 06/17/19   [provider]  promethazine (  PHENERGAN) 12.5 MG tablet Take 1 tablet (12.5 mg total) by mouth every 6 (six) hours as needed for nausea or vomiting. 05/02/19   [name], MD  sertraline (ZOLOFT) 25 MG tablet Take 1 tablet (25 mg total) by mouth daily. Patient not taking: Reported on 06/03/2019 05/03/19   [name], MD    Family History No family history on file.  Social History Social History   Tobacco Use   Smoking status: Former Smoker   Smokeless tobacco: Never Used  Substance Use Topics   Alcohol use: No   Drug use: No     Allergies   Patient has no known allergies.   Review of Systems Review of Systems  Constitutional: Negative for fever.  Respiratory: Negative for shortness of breath.   Cardiovascular: Negative for chest pain.  Gastrointestinal: Positive for nausea and  vomiting. Negative for abdominal pain and diarrhea.  Genitourinary: Negative for dysuria and vaginal bleeding.  All other systems reviewed and are negative.    Physical Exam Updated Vital Signs BP (!) 139/92 (BP Location: Right Arm)    Pulse 94    Temp 99.2 F (37.3 C) (Oral)    Resp 20    Ht 1.524 m (5')    Wt 68 kg    LMP 03/14/2019 (Exact Date)    SpO2 100%    BMI 29.29 kg/m   Physical Exam Vitals signs and nursing note reviewed.  Constitutional:      Appearance: She is well-developed. She is not ill-appearing.  HENT:     Head: Normocephalic and atraumatic.     Mouth/Throat:     Mouth: Mucous membranes are moist.  Eyes:     Pupils: Pupils are equal, round, and reactive to light.  Neck:     Musculoskeletal: Neck supple.  Cardiovascular:     Rate and Rhythm: Normal rate and regular rhythm.     Heart sounds: Normal heart sounds.  Pulmonary:     Effort: Pulmonary effort is normal. No respiratory distress.     Breath sounds: No wheezing.  Abdominal:     General: Bowel sounds are normal.     Palpations: Abdomen is soft.     Tenderness: There is no abdominal tenderness. There is no guarding or rebound.  Musculoskeletal:     Right lower leg: No edema.     Left lower leg: No edema.  Skin:    General: Skin is warm and dry.  Neurological:     Mental Status: She is alert and oriented to person, place, and time.  Psychiatric:        Mood and Affect: Mood normal.      ED Treatments / Results  Labs (all labs ordered are listed, but only abnormal results are displayed) Labs Reviewed  CBC WITH DIFFERENTIAL/PLATELET - Abnormal; Notable for the following components:      Result Value   RBC 2.47 (*)    Hemoglobin 8.0 (*)    HCT 23.4 (*)    All other components within normal limits  BASIC METABOLIC PANEL - Abnormal; Notable for the following components:   Sodium 133 (*)    Glucose, Bld 202 (*)    Creatinine, Ser 1.26 (*)    Calcium 8.2 (*)    All other components within  normal limits  CBG MONITORING, ED - Abnormal; Notable for the following components:   Glucose-Capillary 215 (*)    All other components within normal limits    EKG None  Radiology No results found.  Procedures Procedures (including critical care time)  Medications Ordered in ED Medications  sodium chloride 0.9 % bolus 1,000 mL (1,000 mLs Intravenous New Bag/Given 07/07/19 0056)     Initial Impression / Assessment and Plan / ED Course  I have reviewed the triage vital signs and the nursing notes.  Pertinent labs & imaging results that were available during my care of the patient were reviewed by me and considered in my medical decision making (see chart for details).        Patient presents with hyperglycemia and pregnancy.  She is overall nontoxic-appearing and vital signs are notable for blood pressure of 139/92.  Patient notes that she had slightly high blood pressures earlier in her pregnancy 140s over 90s.  Repeat blood pressure 116/76.  Regarding her hyperglycemia, patient blood sugar here just over 200.  BMP does not have any suggestions of DKA.  Suspect her increased insulin requirements are related to her pregnancy.  She was given a liter of fluids and repeat CBG is 111.  Recommend very close follow-up with her OB/GYN and endocrinologist regarding further recommendations for insulin management.  Additionally, she needs blood pressure recheck closely.  Patient has a follow-up appointment with her OB on Tuesday.  She was given strict return precautions.  After history, exam, and medical workup I feel the patient has been appropriately medically screened and is safe for discharge home. Pertinent diagnoses were discussed with the patient. Patient was given return precautions.   Final Clinical Impressions(s) / ED Diagnoses   Final diagnoses:  Hyperglycemia during pregnancy    ED Discharge Orders    None       Akosua Constantine, Barbette Hair, MD 07/07/19 [phone]

## 2019-07-28 ENCOUNTER — Encounter (HOSPITAL_COMMUNITY): Payer: Self-pay

## 2019-07-28 ENCOUNTER — Other Ambulatory Visit (HOSPITAL_COMMUNITY): Payer: Self-pay | Admitting: *Deleted

## 2019-07-28 ENCOUNTER — Ambulatory Visit (HOSPITAL_COMMUNITY)
Admission: RE | Admit: 2019-07-28 | Discharge: 2019-07-28 | Disposition: A | Discharge status: Home / Self Care | Payer: Managed Care, Other (non HMO) | Source: Ambulatory Visit | Attending: Obstetrics and Gynecology | Admitting: Obstetrics and Gynecology

## 2019-07-28 ENCOUNTER — Ambulatory Visit (HOSPITAL_COMMUNITY): Payer: Managed Care, Other (non HMO) | Admitting: *Deleted

## 2019-07-28 ENCOUNTER — Other Ambulatory Visit: Payer: Self-pay

## 2019-07-28 VITALS — BP 133/88 | HR 89 | Temp 97.8°F

## 2019-07-28 DIAGNOSIS — Z794 Long term (current) use of insulin: Secondary | ICD-10-CM

## 2019-07-28 DIAGNOSIS — Z3A18 18 weeks gestation of pregnancy: Secondary | ICD-10-CM

## 2019-07-28 DIAGNOSIS — O24012 Pre-existing diabetes mellitus, type 1, in pregnancy, second trimester: Secondary | ICD-10-CM

## 2019-07-28 DIAGNOSIS — O24319 Unspecified pre-existing diabetes mellitus in pregnancy, unspecified trimester: Secondary | ICD-10-CM | POA: Insufficient documentation

## 2019-08-12 ENCOUNTER — Encounter (HOSPITAL_COMMUNITY): Payer: Self-pay | Admitting: Pediatrics

## 2019-08-25 ENCOUNTER — Ambulatory Visit (HOSPITAL_COMMUNITY): Payer: Managed Care, Other (non HMO)

## 2019-08-29 NOTE — L&D Delivery Note (Signed)
Delivery Note At 11:44 AM a viable female was delivered via Vaginal, Spontaneous (Presentation: Direct OA     ).  APGAR: 7, 8; weight 3 lb 7.4 oz (1570 g).  Baby had a spontaneous cry.  Good color.  Delayed cord clamping x 1 minute.  Baby placed on mom's abdomen while waiting.    Placenta status: Spontaneous, Intact.  Cord: 3 vessels with the following complications: None.  Cord pH: arterial and venous drawn per NICU request.  Results pending.  Nuchal cord x 1.  Unable to manually reduced. Body delivered through the cord. While collecting cord blood, placenta delivered spontaneously and was at the introitus.  Placenta removed and active bleeding noted.  Labia and vaginal evaluated, no lacerations noted.  Cervix without laceration.  Clots removed from LUS and fundal massage started.  After massage discontinued, bleeding increased.  Cytotec 1000 mcg placed rectally (3 tablets would eventually expel after 20+ minutes).  Hemabate and Lomotil ordered then TXA due to increased bleeding.  I attempted to place a Bakri balloon but I was unable to pass it through the internal os.  A clot was eventually removed and bleeding slowed significantly.  I then ungowned.  Technician noted that bleeding started to trickle with an intermittent gush. 2 units of PRBCs ordered.   Pt consented for D&E and Bakri ballon placement.   Pt was alert and appropriate after baby delivered but after hemorhage, she was nausea, appeared dazed, pale and her eyes were rolling back.  Despite her clinical appearance, her BP, HR and O2 saturations  were normal.  Anesthesia: Epidural Episiotomy: None Lacerations: None Suture Repair: None Est. Blood Loss (mL): 1317  Mom to postpartum.  Baby to NICU.  [name] 10/22/2019, 2:04 PM

## 2019-09-15 ENCOUNTER — Ambulatory Visit (HOSPITAL_COMMUNITY): Admission: RE | Admit: 2019-09-15 | Payer: Managed Care, Other (non HMO) | Source: Ambulatory Visit

## 2019-09-15 ENCOUNTER — Ambulatory Visit (HOSPITAL_COMMUNITY): Payer: Managed Care, Other (non HMO)

## 2019-09-22 ENCOUNTER — Other Ambulatory Visit: Payer: Self-pay

## 2019-09-22 ENCOUNTER — Ambulatory Visit (HOSPITAL_COMMUNITY)
Admission: RE | Admit: 2019-09-22 | Discharge: 2019-09-22 | Disposition: A | Discharge status: Home / Self Care | Payer: Managed Care, Other (non HMO) | Source: Ambulatory Visit | Attending: Obstetrics | Admitting: Obstetrics

## 2019-09-22 ENCOUNTER — Encounter (HOSPITAL_COMMUNITY): Payer: Self-pay | Admitting: *Deleted

## 2019-09-22 ENCOUNTER — Ambulatory Visit (HOSPITAL_COMMUNITY): Payer: Managed Care, Other (non HMO) | Admitting: *Deleted

## 2019-09-22 VITALS — BP 138/89 | HR 85 | Temp 98.0°F

## 2019-09-22 DIAGNOSIS — O24012 Pre-existing diabetes mellitus, type 1, in pregnancy, second trimester: Secondary | ICD-10-CM | POA: Diagnosis not present

## 2019-09-22 DIAGNOSIS — Z3A26 26 weeks gestation of pregnancy: Secondary | ICD-10-CM | POA: Diagnosis not present

## 2019-09-22 DIAGNOSIS — Z362 Encounter for other antenatal screening follow-up: Secondary | ICD-10-CM | POA: Diagnosis not present

## 2019-09-23 ENCOUNTER — Other Ambulatory Visit (HOSPITAL_COMMUNITY): Payer: Self-pay | Admitting: *Deleted

## 2019-09-23 DIAGNOSIS — O24319 Unspecified pre-existing diabetes mellitus in pregnancy, unspecified trimester: Secondary | ICD-10-CM

## 2019-09-23 DIAGNOSIS — Z794 Long term (current) use of insulin: Secondary | ICD-10-CM

## 2019-09-25 NOTE — Progress Notes (Unsigned)
Virtual Visit via Video Note The purpose of this virtual visit is to provide medical care while limiting exposure to the novel coronavirus.    Consent was obtained for video visit:  Yes.   Answered questions that patient had about telehealth interaction:  Yes.   I discussed the limitations, risks, security and privacy concerns of performing an evaluation and management service by telemedicine. I also discussed with the patient that there may be a patient responsible charge related to this service. The patient expressed understanding and agreed to proceed.  Pt location: Home Physician Location: office Name of referring provider:  [name], MD I connected with [name] at patients initiation/request on 09/26/2019 at  7:50 AM EST by video enabled telemedicine application and verified that I am speaking with the correct person using two identifiers. Pt MRN:  [id number] Pt DOB:  July 21, 1996 Video Participants:  [name]   History of Present Illness:  Kathy Lewis is a 24 year old female at *** weeks gestation with CKD and type 1 diabetes who presents for migraines.  History supplemented by referring provider note  Onset:  *** Location:  *** Quality:  *** Intensity:  ***.  *** denies new headache, thunderclap headache or severe headache that wakes *** from sleep. Aura:  *** Premonitory Phase:  *** Postdrome:  *** Associated symptoms:  ***.  *** denies associated unilateral numbness or weakness. Duration:  *** Frequency:  *** Frequency of abortive medication: *** Triggers:  *** Exacerbating factors:  *** Relieving factors:  *** Activity:  ***  Current NSAIDS:  none Current analgesics:  Fioricet; Tylenol Extra-strength Current triptans:  none Current ergotamine:  none Current anti-emetic:  Zofran 4mg ; Reglan 10mg  Current muscle relaxants:  none Current anti-anxiolytic:  none Current sleep aide:  none Current Antihypertensive medications:  none Current  Antidepressant medications:  Zoloft 50mg  Current Anticonvulsant medications:  none Current anti-CGRP:  none Current Vitamins/Herbal/Supplements:  Prenatal vitamin Current Antihistamines/Decongestants:  none Other therapy:  *** Hormone/birth control:  none  Past NSAIDS:  *** Past analgesics:  *** Past abortive triptans:  *** Past abortive ergotamine:  none Past muscle relaxants:  none Past anti-emetic:  Promethazine 25mg  Past antihypertensive medications:  *** Past antidepressant medications:  *** Past anticonvulsant medications:  *** Past anti-CGRP:  none Past vitamins/Herbal/Supplements:  none Past antihistamines/decongestants:  none Other past therapies:  ***  Caffeine:  *** Alcohol:  *** Smoker:  *** Diet:  *** Exercise:  *** Depression:  ***; Anxiety:  *** Other pain:  *** Sleep hygiene:  *** Family history of headache:  ***  07/07/2019 LABS: CBC with WBC 6.2, Hgb 8, HCT 23.4, MCV 94.7, PLT 216; BMP with NA 133, K3.9, CL 104, CO2 22, glucose 202, BUN 15, CR 1.26, GFR >60.   Past Medical History: Past Medical History:  Diagnosis Date  . Chronic kidney disease (CKD), stage II (mild)   . Diabetes mellitus without complication ([place])     Medications: Outpatient Encounter Medications as of 09/26/2019  Medication Sig  . acetaminophen (TYLENOL) 500 MG tablet Take 500 mg by mouth every 6 (six) hours as needed for mild pain.  . ASPIRIN 81 PO Take by mouth.  . butalbital-acetaminophen-caffeine (FIORICET) 50-325-40 MG tablet Take 1 tablet by mouth every 4 (four) hours as needed for headache or migraine.  . insulin aspart (NOVOLOG) 100 UNIT/ML injection Inject 5-10 Units into the skin 3 (three) times daily with meals. Adjust per carb intake  . insulin NPH Human (NOVOLIN N) 100 UNIT/ML injection Inject  0.15 mLs (15 Units total) into the skin 2 (two) times daily at 8 am and 10 pm.  . metroNIDAZOLE (FLAGYL) 500 MG tablet Take 2,000 mg by mouth once.  . ondansetron (ZOFRAN) 4 MG  tablet Take 1 tablet (4 mg total) by mouth every 8 (eight) hours as needed for nausea or vomiting.  . Prenatal Vit-Fe Fumarate-FA (MULTIVITAMIN-PRENATAL) 27-0.8 MG TABS tablet Take 1 tablet by mouth daily at 12 noon.  . promethazine (PHENERGAN) 12.5 MG tablet Take 1 tablet (12.5 mg total) by mouth every 6 (six) hours as needed for nausea or vomiting.  . sertraline (ZOLOFT) 25 MG tablet Take 1 tablet (25 mg total) by mouth daily. (Patient not taking: Reported on 06/03/2019)   No facility-administered encounter medications on file as of 09/26/2019.    Allergies: No Known Allergies  Family History: No family history on file.  Social History: Social History   Socioeconomic History  . Marital status: Single    Spouse name: Not on file  . Number of children: Not on file  . Years of education: Not on file  . Highest education level: Not on file  Occupational History  . Not on file  Tobacco Use  . Smoking status: Former Research scientist (life sciences)  . Smokeless tobacco: Never Used  Substance and Sexual Activity  . Alcohol use: No  . Drug use: No  . Sexual activity: Yes    Birth control/protection: None  Other Topics Concern  . Not on file  Social History Narrative  . Not on file   Social Determinants of Health   Financial Resource Strain:   . Difficulty of Paying Living Expenses: Not on file  Food Insecurity:   . Worried About Charity fundraiser in the Last Year: Not on file  . Ran Out of Food in the Last Year: Not on file  Transportation Needs:   . Lack of Transportation (Medical): Not on file  . Lack of Transportation (Non-Medical): Not on file  Physical Activity:   . Days of Exercise per Week: Not on file  . Minutes of Exercise per Session: Not on file  Stress:   . Feeling of Stress : Not on file  Social Connections:   . Frequency of Communication with Friends and Family: Not on file  . Frequency of Social Gatherings with Friends and Family: Not on file  . Attends Religious Services: Not  on file  . Active Member of Clubs or Organizations: Not on file  . Attends Archivist Meetings: Not on file  . Marital Status: Not on file  Intimate Partner Violence:   . Fear of Current or Ex-Partner: Not on file  . Emotionally Abused: Not on file  . Physically Abused: Not on file  . Sexually Abused: Not on file    Observations/Objective:   *** No acute distress.  Alert and oriented.  Speech fluent and not dysarthric.  Language intact.  Eyes orthophoric on primary gaze.  Face symmetric.  Assessment and Plan:   ***  Follow Up Instructions:    -I discussed the assessment and treatment plan with the patient. The patient was provided an opportunity to ask questions and all were answered. The patient agreed with the plan and demonstrated an understanding of the instructions.   The patient was advised to call back or seek an in-person evaluation if the symptoms worsen or if the condition fails to improve as anticipated.    Total Time spent in visit with the patient was:  ***, of  which more than 50% of the time was spent in counseling and/or coordinating care on ***.   Pt understands and agrees with the plan of care outlined.     Dudley Major, DO

## 2019-09-26 ENCOUNTER — Other Ambulatory Visit: Payer: Self-pay

## 2019-09-26 ENCOUNTER — Telehealth: Payer: Managed Care, Other (non HMO) | Admitting: Neurology

## 2019-10-13 ENCOUNTER — Other Ambulatory Visit: Payer: Self-pay | Admitting: Obstetrics and Gynecology

## 2019-10-13 ENCOUNTER — Other Ambulatory Visit: Payer: Self-pay

## 2019-10-13 ENCOUNTER — Observation Stay (HOSPITAL_COMMUNITY)
Admission: AD | Admit: 2019-10-13 | Discharge: 2019-10-14 | Disposition: A | Discharge status: Home / Self Care | Payer: Managed Care, Other (non HMO) | Source: Home / Self Care | Attending: Obstetrics and Gynecology | Admitting: Obstetrics and Gynecology

## 2019-10-13 ENCOUNTER — Encounter (HOSPITAL_COMMUNITY): Payer: Self-pay | Admitting: Obstetrics and Gynecology

## 2019-10-13 DIAGNOSIS — O1493 Unspecified pre-eclampsia, third trimester: Secondary | ICD-10-CM | POA: Diagnosis not present

## 2019-10-13 DIAGNOSIS — O99013 Anemia complicating pregnancy, third trimester: Secondary | ICD-10-CM | POA: Insufficient documentation

## 2019-10-13 DIAGNOSIS — N182 Chronic kidney disease, stage 2 (mild): Secondary | ICD-10-CM | POA: Insufficient documentation

## 2019-10-13 DIAGNOSIS — O134 Gestational [pregnancy-induced] hypertension without significant proteinuria, complicating childbirth: Secondary | ICD-10-CM | POA: Diagnosis not present

## 2019-10-13 DIAGNOSIS — O1414 Severe pre-eclampsia complicating childbirth: Secondary | ICD-10-CM | POA: Diagnosis not present

## 2019-10-13 DIAGNOSIS — O113 Pre-existing hypertension with pre-eclampsia, third trimester: Secondary | ICD-10-CM | POA: Diagnosis not present

## 2019-10-13 DIAGNOSIS — O24313 Unspecified pre-existing diabetes mellitus in pregnancy, third trimester: Secondary | ICD-10-CM | POA: Diagnosis not present

## 2019-10-13 DIAGNOSIS — E109 Type 1 diabetes mellitus without complications: Secondary | ICD-10-CM | POA: Insufficient documentation

## 2019-10-13 DIAGNOSIS — Z3A29 29 weeks gestation of pregnancy: Secondary | ICD-10-CM | POA: Insufficient documentation

## 2019-10-13 DIAGNOSIS — Z794 Long term (current) use of insulin: Secondary | ICD-10-CM | POA: Insufficient documentation

## 2019-10-13 DIAGNOSIS — D649 Anemia, unspecified: Secondary | ICD-10-CM | POA: Diagnosis present

## 2019-10-13 DIAGNOSIS — E1022 Type 1 diabetes mellitus with diabetic chronic kidney disease: Secondary | ICD-10-CM | POA: Insufficient documentation

## 2019-10-13 DIAGNOSIS — Z20822 Contact with and (suspected) exposure to covid-19: Secondary | ICD-10-CM | POA: Insufficient documentation

## 2019-10-13 DIAGNOSIS — O24013 Pre-existing diabetes mellitus, type 1, in pregnancy, third trimester: Secondary | ICD-10-CM | POA: Insufficient documentation

## 2019-10-13 DIAGNOSIS — Z87891 Personal history of nicotine dependence: Secondary | ICD-10-CM | POA: Insufficient documentation

## 2019-10-13 DIAGNOSIS — O26833 Pregnancy related renal disease, third trimester: Secondary | ICD-10-CM | POA: Insufficient documentation

## 2019-10-13 DIAGNOSIS — O219 Vomiting of pregnancy, unspecified: Secondary | ICD-10-CM | POA: Diagnosis not present

## 2019-10-13 LAB — CBC
HCT: 19.6 % — ABNORMAL LOW (ref 36.0–46.0)
Hemoglobin: 6.5 g/dL — CL (ref 12.0–15.0)
MCH: 31.7 pg (ref 26.0–34.0)
MCHC: 33.2 g/dL (ref 30.0–36.0)
MCV: 95.6 fL (ref 80.0–100.0)
Platelets: 142 10*3/uL — ABNORMAL LOW (ref 150–400)
RBC: 2.05 MIL/uL — ABNORMAL LOW (ref 3.87–5.11)
RDW: 13.2 % (ref 11.5–15.5)
WBC: 7.2 10*3/uL (ref 4.0–10.5)
nRBC: 0 % (ref 0.0–0.2)

## 2019-10-13 LAB — PREPARE RBC (CROSSMATCH)

## 2019-10-13 LAB — GLUCOSE, CAPILLARY: Glucose-Capillary: 125 mg/dL — ABNORMAL HIGH (ref 70–99)

## 2019-10-13 LAB — MAGNESIUM: Magnesium: 1.8 mg/dL (ref 1.7–2.4)

## 2019-10-13 LAB — ABO/RH: ABO/RH(D): O NEG

## 2019-10-13 MED ORDER — DIPHENHYDRAMINE HCL 25 MG PO CAPS
25.0000 mg | ORAL_CAPSULE | Freq: Once | ORAL | Status: AC
  Administered 2019-10-13: 25 mg via ORAL
  Filled 2019-10-13: qty 1

## 2019-10-13 MED ORDER — INSULIN NPH (HUMAN) (ISOPHANE) 100 UNIT/ML ~~LOC~~ SUSP
20.0000 [IU] | Freq: Two times a day (BID) | SUBCUTANEOUS | Status: DC
  Administered 2019-10-13: 20 [IU] via SUBCUTANEOUS
  Filled 2019-10-13 (×2): qty 10

## 2019-10-13 MED ORDER — INSULIN ASPART 100 UNIT/ML ~~LOC~~ SOLN: 15.0000 [IU] | Freq: Two times a day (BID) | SUBCUTANEOUS | Status: DC

## 2019-10-13 MED ORDER — NIFEDIPINE ER OSMOTIC RELEASE 30 MG PO TB24
30.0000 mg | ORAL_TABLET | Freq: Every day | ORAL | Status: DC
  Administered 2019-10-13: 30 mg via ORAL
  Filled 2019-10-13: qty 1

## 2019-10-13 MED ORDER — INSULIN ASPART 100 UNIT/ML ~~LOC~~ SOLN: 10.0000 [IU] | Freq: Every day | SUBCUTANEOUS | Status: DC

## 2019-10-13 MED ORDER — PRENATAL MULTIVITAMIN CH
1.0000 | ORAL_TABLET | Freq: Every day | ORAL | Status: DC
  Administered 2019-10-13: 1 via ORAL
  Filled 2019-10-13: qty 1

## 2019-10-13 MED ORDER — SODIUM CHLORIDE 0.9% IV SOLUTION: Freq: Once | INTRAVENOUS | Status: AC

## 2019-10-13 MED ORDER — ACETAMINOPHEN 325 MG PO TABS
650.0000 mg | ORAL_TABLET | Freq: Once | ORAL | Status: AC
  Administered 2019-10-13: 650 mg via ORAL
  Filled 2019-10-13: qty 2

## 2019-10-13 MED ORDER — ACETAMINOPHEN 325 MG PO TABS: 650.0000 mg | ORAL_TABLET | ORAL | Status: DC | PRN

## 2019-10-13 MED ORDER — DOCUSATE SODIUM 100 MG PO CAPS
100.0000 mg | ORAL_CAPSULE | Freq: Every day | ORAL | Status: DC
  Filled 2019-10-13: qty 1

## 2019-10-13 MED ORDER — INSULIN ASPART 100 UNIT/ML ~~LOC~~ SOLN: 0.0000 [IU] | Freq: Three times a day (TID) | SUBCUTANEOUS | Status: DC

## 2019-10-13 MED ORDER — CALCIUM CARBONATE ANTACID 500 MG PO CHEW: 2.0000 | CHEWABLE_TABLET | ORAL | Status: DC | PRN

## 2019-10-13 MED ORDER — INSULIN ASPART 100 UNIT/ML ~~LOC~~ SOLN: 10.0000 [IU] | Freq: Three times a day (TID) | SUBCUTANEOUS | Status: DC

## 2019-10-13 NOTE — Plan of Care (Signed)

## 2019-10-13 NOTE — H&P (Signed)
Kathy Lewis is a 24 y.o. female G2 P0  @ 29 weeks with h/o preexistingType I Diabetes and Nephropathy.2presenting for blood transfusion due to severe anemia, Hg 6.4.  Pt reported seeing "floaties" in her vision and being very fatigued.  Pt's BP has been mildly elevated in the last weeks.  Due to h/o nephropathy, Procardia XL 30 mg was started today.  OB History    Gravida  2   Para      Term      Preterm      AB  1   Living        SAB  1   TAB      Ectopic      Multiple      Live Births             Past Medical History:  Diagnosis Date  . Chronic kidney disease (CKD), stage II (mild)   . Diabetes mellitus without complication [facility])    Past Surgical History:  Procedure Laterality Date  . NO PAST SURGERIES     Family History: family history is not on file. Social History:  reports that she has quit smoking. She has never used smokeless tobacco. She reports that she does not drink alcohol or use drugs.     Maternal Diabetes: Yes:  Diabetes Type:  Pre-pregnancy, Insulin/Medication controlled Genetic Screening: Normal Maternal Ultrasounds/Referrals: Normal Fetal Ultrasounds or other Referrals:  Referred to Materal Fetal Medicine  Maternal Substance Abuse:  No Significant Maternal Medications:  Meds include: Other: Insulin, Procardia XL Significant Maternal Lab Results:  Other: N/a Other Comments:  Type 1 diabetes, nephropathy  Review of Systems  Constitutional: Positive for fatigue.  Eyes: Positive for visual disturbance.  Respiratory: Negative for chest tightness and shortness of breath.   Gastrointestinal: Positive for diarrhea.   Maternal Medical History:  Fetal activity: Perceived fetal activity is normal.    Prenatal complications: PIH and infection.   Nephropathy- Stage II followed by Nephrology  Prenatal Complications - Diabetes: type 1. Diabetes is managed by intensive insulin program.        Blood pressure (!) 143/92, pulse 97, temperature  98.4 F (36.9 C), temperature source Oral, resp. rate 18, height 5' (1.524 m), weight 79.8 kg, last menstrual period 03/14/2019.   Fetal Exam Fetal Monitor Review: Mode: hand-held doppler probe.   Baseline rate: 137.      Physical Exam  Constitutional: She is oriented to person, place, and time. She appears well-developed and well-nourished. No distress.  HENT:  Head: Normocephalic and atraumatic.  Eyes: EOM are normal.  Respiratory: Effort normal. No respiratory distress.  GI: There is no abdominal tenderness.  Musculoskeletal:        General: Normal range of motion.     Cervical back: Normal range of motion.  Neurological: She is alert and oriented to person, place, and time.  Skin: Skin is warm.  Psychiatric: She has a normal mood and affect.    Prenatal labs: ABO, Rh:  O Neg Antibody:  Neg   Assessment/Plan: IUP @ 29 3/7 weeks Severe Anemia, Symptomatic  Transfuse 2 units PRBCs Type I Diabetes  Novolog 15 units AC dinner  Novolin 20 units qhs Nephropathy  Rh negative  S/p Rhogam. Doppler on arrival. Plan for discharge home after blood transfusion.  [name] 10/13/2019, 9:29 PM

## 2019-10-14 LAB — SARS CORONAVIRUS 2 (TAT 6-24 HRS): SARS Coronavirus 2: NEGATIVE

## 2019-10-14 LAB — GLUCOSE, CAPILLARY
Glucose-Capillary: 44 mg/dL — CL (ref 70–99)
Glucose-Capillary: 60 mg/dL — ABNORMAL LOW (ref 70–99)
Glucose-Capillary: 106 mg/dL — ABNORMAL HIGH (ref 70–99)
Glucose-Capillary: 59 mg/dL — ABNORMAL LOW (ref 70–99)
Glucose-Capillary: 90 mg/dL (ref 70–99)

## 2019-10-14 MED ORDER — NIFEDIPINE ER OSMOTIC RELEASE 30 MG PO TB24
30.0000 mg | ORAL_TABLET | Freq: Once | ORAL | Status: AC
  Administered 2019-10-14: 30 mg via ORAL
  Filled 2019-10-14: qty 1

## 2019-10-14 NOTE — Progress Notes (Signed)
CBG 59. Apple juice given. Pt just received breakfast tray and is starting to eat.   Will recheck after breakfast. Diabetes Coordinator on unit and aware of hypoglycemia.

## 2019-10-14 NOTE — Progress Notes (Signed)
Inpatient Diabetes Program Recommendations  Diabetes Treatment Program Recommendations  ADA Standards of Care 2018 Diabetes in Pregnancy Target Glucose Ranges:  Fasting: 60 - 90 mg/dL Preprandial: 60 - 105 mg/dL 1 hr postprandial: Less than 140mg /dL (from first bite of meal) 2 hr postprandial: Less than 120 mg/dL (from first bite of meal)    Lab Results  Component Value Date   GLUCAP 59 (L) 10/14/2019   HGBA1C 12.1 (H) 12/18/2014    Review of Glycemic Control Results for Kathy Lewis, Kathy Lewis (MRN [id number]) as of 10/14/2019 08:45  Ref. Range 10/14/2019 04:37 10/14/2019 04:57 10/14/2019 05:31 10/14/2019 09:06  Glucose-Capillary Latest Ref Range: 70 - 99 mg/dL 44 (LL) 60 (L) 106 (H) 59 (L)   Diabetes history: Type 1 DM Outpatient Diabetes medications: Novolog 5-10 units TID, NPH 15 units BID Current orders for Inpatient glycemic control: Novolog 15 units with lunch and supper, Novolog 10 units with breakfast, NPH 20 units BID, Novolog 0-16 units TID  Inpatient Diabetes Program Recommendations:    Noted hypoglycemia this AM of 44 mg/dL, along with associated interventions.   In preparation for discharge consider:  NPH 8 units now, then NPH 16 units BID (to start this QHS)  If to remain inpatient, consider:  -Decreasing correction to Novolog 0-14 units TID -Decreasing Novolog to 10 units TID (assuming patient is consuming >50% of meal).    Spoke with patient to discuss outpatient diabetes management. Patient verifies home medications and admits to daily hypoglycemia in the AM, around 50's mg/dL.  Reviewed previous A1C and long term history of elevated A1Cs >10%. Discussed patho of DM, impact of poor glycemic control on kidney function long term, risks for hypoglycemia with renal status, implications of glycemic control and hypoglycemia in pregnancy, when to call MD, vascular changes and commorbidities.  Patient has supplies and reports checking blood sugars FsBG and 2hr pp. Reports post  prandial checks have ranged between 80-120 mg/dL and that she bases her Novolog meal coverage on carbs consumption.   Dr Simona Huh paged regarding discharge recommendations. Notified assistant of recurring hypoglycemic episodes, need for updated discharge orders and that patient has not yet received AM NPH. MD to review.    Thanks, Bronson Curb, MSN, RNC-OB Diabetes Coordinator [phone] (8a-5p)

## 2019-10-14 NOTE — Progress Notes (Signed)
  Hypoglycemic Event  CBG: 44  Treatment: 8 oz juice/soda  Symptoms: None  Follow-up CBG: [id number] CBG Result:60  Additional treatment: 4 oz juice, peanut butter  Follow up cbg: Time: [id number] CBG result: 106  Possible Reasons for Event: Unknown  Comments/MD notified: Burman Foster, cnm    Glenice Bow

## 2019-10-14 NOTE — Discharge Summary (Signed)
Physician Discharge Summary  Patient ID: Onesti Conard MRN: [id number] DOB/AGE: July 30, 1996 24 y.o.  Admit date: 10/13/2019 Discharge date: 10/14/2019  Admission Diagnoses:  IUP @ 29 weeks, Severe anemia, Gestational HTN, Type I DM, CKD Stage 2  Discharge Diagnoses:  Active Problems:   Severe anemia   Discharged Condition: good  Hospital Course: Pt was admitted for a blood transfusion due to symptomatic anemia.  Pt was admitted for observation for the infusion of 2 units of PRBCs.  Pt presented for infusion a few hours before midnight so she remained overnight.  She had just been diagnosed with Gestational HTN that day and started on Procardia XL 30 mg.  The morning after the transfusion was started Her BP was elevated.  She was given another dose of  Procardia 30 mg XL.  BP was mildly elevated.  Pt denied symptoms of Preeclampsia.  Consults: None  Significant Diagnostic Studies: {Hg 6.4  Treatments: 2 units PRBCs  Discharge Exam: Blood pressure (!) 146/98, pulse 92, temperature 98.3 F (36.8 C), temperature source Oral, resp. rate 18, height 5' (1.524 m), weight 79.8 kg, last menstrual period 03/14/2019, SpO2 98 %. Gen:  NAD  Abd:  No upper abdominal pain. Gravid uterus, nontender Ext:  +1 edema.  Disposition: Discharge disposition: 01-Home or Self Care       Discharge Instructions    Discharge activity:  No Restrictions   Complete by: As directed    Discharge diet:   Complete by: As directed    Diabetic diet   Fetal Kick Count:  Lie on our left side for one hour after a meal, and count the number of times your baby kicks.  If it is less than 5 times, get up, move around and drink some juice.  Repeat the test 30 minutes later.  If it is still less than 5 kicks in an hour, notify your doctor.   Complete by: As directed    No sexual activity restrictions   Complete by: As directed    Notify physician for a general feeling that "something is not right"   Complete by: As  directed    Notify physician for increase or change in vaginal discharge   Complete by: As directed    Notify physician for intestinal cramps, with or without diarrhea, sometimes described as "gas pain"   Complete by: As directed    Notify physician for leaking of fluid   Complete by: As directed    Notify physician for low, dull backache, unrelieved by heat or Tylenol   Complete by: As directed    Notify physician for menstrual like cramps   Complete by: As directed    Notify physician for pelvic pressure   Complete by: As directed    Notify physician for uterine contractions.  These may be painless and feel like the uterus is tightening or the baby is  "balling up"   Complete by: As directed    Notify physician for vaginal bleeding   Complete by: As directed    PRETERM LABOR:  Includes any of the follwing symptoms that occur between 20 - [redacted] weeks gestation.  If these symptoms are not stopped, preterm labor can result in preterm delivery, placing your baby at risk   Complete by: As directed      Allergies as of 10/14/2019   No Known Allergies     Medication List    TAKE these medications   acetaminophen 500 MG tablet Commonly known as: TYLENOL Take 500 mg  by mouth every 6 (six) hours as needed for mild pain.   ASPIRIN 81 PO Take by mouth.   butalbital-acetaminophen-caffeine 50-325-40 MG tablet Commonly known as: FIORICET Take 1 tablet by mouth every 4 (four) hours as needed for headache or migraine.   insulin aspart 100 UNIT/ML injection Commonly known as: novoLOG Inject 5-10 Units into the skin 3 (three) times daily with meals. Adjust per carb intake   insulin NPH Human 100 UNIT/ML injection Commonly known as: NOVOLIN N Inject 0.15 mLs (15 Units total) into the skin 2 (two) times daily at 8 am and 10 pm.   metroNIDAZOLE 500 MG tablet Commonly known as: FLAGYL Take 2,000 mg by mouth once.   multivitamin-prenatal 27-0.8 MG Tabs tablet Take 1 tablet by mouth daily  at 12 noon.   ondansetron 4 MG tablet Commonly known as: ZOFRAN Take 1 tablet (4 mg total) by mouth every 8 (eight) hours as needed for nausea or vomiting.   promethazine 12.5 MG tablet Commonly known as: PHENERGAN Take 1 tablet (12.5 mg total) by mouth every 6 (six) hours as needed for nausea or vomiting.   sertraline 25 MG tablet Commonly known as: ZOLOFT Take 1 tablet (25 mg total) by mouth daily.      Follow-up Information    [name], MD. Schedule an appointment as soon as possible for a visit on 10/16/2019.   Specialty: Obstetrics and Gynecology Why: Thursday, February 18.  Pt needs to submit 24 hour urine collection at that visit. Contact information: [address] [place] [address] [phone]           Signed: [name] 10/14/2019, 9:56 AM

## 2019-10-14 NOTE — Discharge Instructions (Signed)
Hypertension During Pregnancy High blood pressure (hypertension) is when the force of blood pumping through the arteries is too strong. Arteries are blood vessels that carry blood from the heart throughout the body. Hypertension during pregnancy can be mild or severe. Severe hypertension during pregnancy (preeclampsia) is a medical emergency that requires prompt evaluation and treatment. Different types of hypertension can happen during pregnancy. These include:  Chronic hypertension. This happens when you had high blood pressure before you became pregnant, and it continues during the pregnancy. Hypertension that develops before you are [redacted] weeks pregnant and continues during the pregnancy is also called chronic hypertension. If you have chronic hypertension, it will not go away after you have your baby. You will need follow-up visits with your health care provider after you have your baby. Your doctor may want you to keep taking medicine for your blood pressure.  Gestational hypertension. This is hypertension that develops after the 20th week of pregnancy. Gestational hypertension usually goes away after you have your baby, but your health care provider will need to monitor your blood pressure to make sure that it is getting better.  Preeclampsia. This is severe hypertension during pregnancy. This can cause serious complications for you and your baby and can also cause complications for you after the delivery of your baby.  Postpartum preeclampsia. You may develop severe hypertension after giving birth. This usually occurs within 48 hours after childbirth but may occur up to 6 weeks after giving birth. This is rare. How does this affect me? Women who have hypertension during pregnancy have a greater chance of developing hypertension later in life or during future pregnancies. In some cases, hypertension during pregnancy can cause serious complications, such as:  Stroke.  Heart attack.  Injury to  other organs, such as kidneys, lungs, or liver.  Preeclampsia.  Convulsions or seizures.  Placental abruption. How does this affect my baby? Hypertension during pregnancy can affect your baby. Your baby may:  Be born early (prematurely).  Not weigh as much as he or she should at birth (low birth weight).  Not tolerate labor well, leading to an unplanned cesarean delivery. What are the risks? There are certain factors that make it more likely for you to develop hypertension during pregnancy. These include:  Having hypertension during a previous pregnancy.  Being overweight.  Being age 35 or older.  Being pregnant for the first time.  Being pregnant with more than one baby.  Becoming pregnant using fertilization methods, such as IVF (in vitro fertilization).  Having other medical problems, such as diabetes, kidney disease, or lupus.  Having a family history of hypertension. What can I do to lower my risk? The exact cause of hypertension during pregnancy is not known. You may be able to lower your risk by:  Maintaining a healthy weight.  Eating a healthy and balanced diet.  Following your health care provider's instructions about treating any long-term conditions that you had before becoming pregnant. It is very important to keep all of your prenatal care appointments. Your health care provider will check your blood pressure and make sure that your pregnancy is progressing as expected. If a problem is found, early treatment can prevent complications. How is this treated? Treatment for hypertension during pregnancy varies depending on the type of hypertension you have and how serious it is.  If you were taking medicine for high blood pressure before you became pregnant, talk with your health care provider. You may need to change medicine during pregnancy because   some medicines, like ACE inhibitors, may not be considered safe for your baby.  If you have gestational  hypertension, your health care provider may order medicine to treat this during pregnancy.  If you are at risk for preeclampsia, your health care provider may recommend that you take a low-dose aspirin during your pregnancy.  If you have severe hypertension, you may need to be hospitalized so you and your baby can be monitored closely. You may also need to be given medicine to lower your blood pressure. This medicine may be given by mouth or through an IV.  In some cases, if your condition gets worse, you may need to deliver your baby early. Follow these instructions at home: Eating and drinking   Drink enough fluid to keep your urine pale yellow.  Avoid caffeine. Lifestyle  Do not use any products that contain nicotine or tobacco, such as cigarettes, e-cigarettes, and chewing tobacco. If you need help quitting, ask your health care provider.  Do not use alcohol or drugs.  Avoid stress as much as possible.  Rest and get plenty of sleep.  Regular exercise can help to reduce your blood pressure. Ask your health care provider what kinds of exercise are best for you. General instructions  Take over-the-counter and prescription medicines only as told by your health care provider.  Keep all prenatal and follow-up visits as told by your health care provider. This is important. Contact a health care provider if:  You have symptoms that your health care provider told you may require more treatment or monitoring, such as: ? Headaches. ? Nausea or vomiting. ? Abdominal pain. ? Dizziness. ? Light-headedness. Get help right away if:  You have: ? Severe abdominal pain that does not get better with treatment. ? A severe headache that does not get better. ? Vomiting that does not get better. ? Sudden, rapid weight gain. ? Sudden swelling in your hands, ankles, or face. ? Vaginal bleeding. ? Blood in your urine. ? Blurred or double vision. ? Shortness of breath or chest  pain. ? Weakness on one side of your body. ? Difficulty speaking.  Your baby is not moving as much as usual. Summary  High blood pressure (hypertension) is when the force of blood pumping through the arteries is too strong.  Hypertension during pregnancy can cause problems for you and your baby.  Treatment for hypertension during pregnancy varies depending on the type of hypertension you have and how serious it is.  Keep all prenatal and follow-up visits as told by your health care provider. This is important. This information is not intended to replace advice given to you by your health care provider. Make sure you discuss any questions you have with your health care provider. Document Revised: 12/05/2018 Document Reviewed: 09/10/2018 Elsevier Patient Education  [place].   Preeclampsia and Eclampsia Preeclampsia is a serious condition that may develop during pregnancy. This condition causes high blood pressure and increased protein in your urine along with other symptoms, such as headaches and vision changes. These symptoms may develop as the condition gets worse. Preeclampsia may occur at 20 weeks of pregnancy or later. Diagnosing and treating preeclampsia early is very important. If not treated early, it can cause serious problems for you and your baby. One problem it can lead to is eclampsia. Eclampsia is a condition that causes muscle jerking or shaking (convulsions or seizures) and other serious problems for the mother. During pregnancy, delivering your baby may be the best treatment  for preeclampsia or eclampsia. For most women, preeclampsia and eclampsia symptoms go away after giving birth. In rare cases, a woman may develop preeclampsia after giving birth (postpartum preeclampsia). This usually occurs within 48 hours after childbirth but may occur up to 6 weeks after giving birth. What are the causes? The cause of preeclampsia is not known. What increases the risk? The  following risk factors make you more likely to develop preeclampsia:  Being pregnant for the first time.  Having had preeclampsia during a past pregnancy.  Having a family history of preeclampsia.  Having high blood pressure.  Being pregnant with more than one baby.  Being 28 or older.  Being African-American.  Having kidney disease or diabetes.  Having medical conditions such as lupus or blood diseases.  Being very overweight (obese). What are the signs or symptoms? The most common symptoms are:  Severe headaches.  Vision problems, such as blurred or double vision.  Abdominal pain, especially upper abdominal pain. Other symptoms that may develop as the condition gets worse include:  Sudden weight gain.  Sudden swelling of the hands, face, legs, and feet.  Severe nausea and vomiting.  Numbness in the face, arms, legs, and feet.  Dizziness.  Urinating less than usual.  Slurred speech.  Convulsions or seizures. How is this diagnosed? There are no screening tests for preeclampsia. Your health care provider will ask you about symptoms and check for signs of preeclampsia during your prenatal visits. You may also have tests that include:  Checking your blood pressure.  Urine tests to check for protein. Your health care provider will check for this at every prenatal visit.  Blood tests.  Monitoring your baby's heart rate.  Ultrasound. How is this treated? You and your health care provider will determine the treatment approach that is best for you. Treatment may include:  Having more frequent prenatal exams to check for signs of preeclampsia, if you have an increased risk for preeclampsia.  Medicine to lower your blood pressure.  Staying in the hospital, if your condition is severe. There, treatment will focus on controlling your blood pressure and the amount of fluids in your body (fluid retention).  Taking medicine (magnesium sulfate) to prevent seizures.  This may be given as an injection or through an IV.  Taking a low-dose aspirin during your pregnancy.  Delivering your baby early. You may have your labor started with medicine (induced), or you may have a cesarean delivery. Follow these instructions at home: Eating and drinking   Drink enough fluid to keep your urine pale yellow.  Avoid caffeine. Lifestyle  Do not use any products that contain nicotine or tobacco, such as cigarettes and e-cigarettes. If you need help quitting, ask your health care provider.  Do not use alcohol or drugs.  Avoid stress as much as possible. Rest and get plenty of sleep. General instructions  Take over-the-counter and prescription medicines only as told by your health care provider.  When lying down, lie on your left side. This keeps pressure off your major blood vessels.  When sitting or lying down, raise (elevate) your feet. Try putting some pillows underneath your lower legs.  Exercise regularly. Ask your health care provider what kinds of exercise are best for you.  Keep all follow-up and prenatal visits as told by your health care provider. This is important. How is this prevented? There is no known way of preventing preeclampsia or eclampsia from developing. However, to lower your risk of complications and detect problems  early:  Get regular prenatal care. Your health care provider may be able to diagnose and treat the condition early.  Maintain a healthy weight. Ask your health care provider for help managing weight gain during pregnancy.  Work with your health care provider to manage any long-term (chronic) health conditions you have, such as diabetes or kidney problems.  You may have tests of your blood pressure and kidney function after giving birth.  Your health care provider may have you take low-dose aspirin during your next pregnancy. Contact a health care provider if:  You have symptoms that your health care provider told you  may require more treatment or monitoring, such as: ? Headaches. ? Nausea or vomiting. ? Abdominal pain. ? Dizziness. ? Light-headedness. Get help right away if:  You have severe: ? Abdominal pain. ? Headaches that do not get better. ? Dizziness. ? Vision problems. ? Confusion. ? Nausea or vomiting.  You have any of the following: ? A seizure. ? Sudden, rapid weight gain. ? Sudden swelling in your hands, ankles, or face. ? Trouble moving any part of your body. ? Numbness in any part of your body. ? Trouble speaking. ? Abnormal bleeding.  You faint. Summary  Preeclampsia is a serious condition that may develop during pregnancy.  This condition causes high blood pressure and increased protein in your urine along with other symptoms, such as headaches and vision changes.  Diagnosing and treating preeclampsia early is very important. If not treated early, it can cause serious problems for you and your baby.  Get help right away if you have symptoms that your health care provider told you to watch for. This information is not intended to replace advice given to you by your health care provider. Make sure you discuss any questions you have with your health care provider. Document Revised: 04/16/2018 Document Reviewed: 03/20/2016 Elsevier Patient Education  [place] Urine Collection A 24-hour urine specimen is a lab test that requires collection of all your urine for an entire day. This is sometimes called a timed urine test. It can provide more information than a single urine sample. There are many reasons to have this test. Your health care provider may order the test to check for or monitor the following conditions:  High blood pressure.  Kidney disease.  Kidney stones.  Urinary tract infections.  Pregnancy.  Diabetes. How do I prepare for this test?  You may be asked to follow a special diet during or before the collection period. Follow any  instructions from your health care provider. If no special instructions are given, you may eat and drink normally.  Take over-the-counter and prescription medicines only as told by your health care provider.  Let your health care provider know about any medicines that you are taking, including over-the-counter medicines, vitamins, herbs, and supplements.  Choose a collection day when you can be at home or when you have a place to store the urine. All urine must be collected during the testing period. How do I do a 24-hour urine collection?   When you get up in the morning, urinate in the toilet and flush. Write down the time. This will be your start time on the day of collection and your end time on the next morning.  From the start time on, all of your urine should be kept in the collection jug that you received from the lab.  If the jug that is given to you already has liquid  in it, that is okay. Do not throw out the liquid or rinse out the jug. Some tests need the liquid to be added to your urine.  Urinate into a specimen container, such as a urinal or pan that sits over the toilet. Pour the urine from the container into the collection jug. Be careful not to spill any of the urine. Use the equipment provided by the lab.  Do not let any toilet paper or stool (feces) get into the jug. This will contaminate the sample.  Stop collecting your urine 24 hours after you started. Collect the last specimen as close as possible to the end of the 24-hour period.  Keep the jug cool in an ice chest or keep it in the refrigerator during collection.  When the 24-hour collection is complete, bring the jug to the lab. Keep the jug cool in an ice chest while you are bringing it to the lab. What do the results mean? Talk with your health care provider about what your results mean. Questions to ask your health care provider Ask your health care provider, or the department that is doing the test:  When  will my results be ready?  How will I get my results?  What are my treatment options?  What other tests do I need?  What are my next steps? Summary  A 24-hour urine specimen is a lab test that requires collection of all your urine for an entire day.  When you get up in the morning, urinate in the toilet and flush. Write down the time. For the next 24 hours, collect all of your urine in the collection jug that you received from the lab.  Keep the jug cool while collecting the urine and while bringing it back to the lab.  Take the jug of urine back to the lab as soon as possible after the collection period has ended. This information is not intended to replace advice given to you by your health care provider. Make sure you discuss any questions you have with your health care provider. Document Revised: 05/07/2019 Document Reviewed: 08/27/2017 Elsevier Patient Education  2020 [employer].

## 2019-10-15 ENCOUNTER — Encounter (HOSPITAL_COMMUNITY): Payer: Self-pay | Admitting: Obstetrics and Gynecology

## 2019-10-15 ENCOUNTER — Inpatient Hospital Stay (HOSPITAL_COMMUNITY)
Admission: AD | Admit: 2019-10-15 | Discharge: 2019-10-27 | DRG: 768 | Disposition: A | Discharge status: Home / Self Care | Payer: Managed Care, Other (non HMO) | Attending: Obstetrics and Gynecology | Admitting: Obstetrics and Gynecology

## 2019-10-15 ENCOUNTER — Inpatient Hospital Stay (HOSPITAL_COMMUNITY): Payer: Managed Care, Other (non HMO)

## 2019-10-15 ENCOUNTER — Other Ambulatory Visit: Payer: Self-pay

## 2019-10-15 DIAGNOSIS — O9942 Diseases of the circulatory system complicating childbirth: Secondary | ICD-10-CM | POA: Diagnosis present

## 2019-10-15 DIAGNOSIS — O26873 Cervical shortening, third trimester: Secondary | ICD-10-CM | POA: Diagnosis not present

## 2019-10-15 DIAGNOSIS — B9689 Other specified bacterial agents as the cause of diseases classified elsewhere: Secondary | ICD-10-CM | POA: Diagnosis present

## 2019-10-15 DIAGNOSIS — Z3A29 29 weeks gestation of pregnancy: Secondary | ICD-10-CM | POA: Diagnosis not present

## 2019-10-15 DIAGNOSIS — D62 Acute posthemorrhagic anemia: Secondary | ICD-10-CM | POA: Diagnosis not present

## 2019-10-15 DIAGNOSIS — O1414 Severe pre-eclampsia complicating childbirth: Secondary | ICD-10-CM | POA: Diagnosis present

## 2019-10-15 DIAGNOSIS — O9832 Other infections with a predominantly sexual mode of transmission complicating childbirth: Secondary | ICD-10-CM | POA: Diagnosis present

## 2019-10-15 DIAGNOSIS — Z87891 Personal history of nicotine dependence: Secondary | ICD-10-CM

## 2019-10-15 DIAGNOSIS — O99892 Other specified diseases and conditions complicating childbirth: Secondary | ICD-10-CM | POA: Diagnosis present

## 2019-10-15 DIAGNOSIS — O119 Pre-existing hypertension with pre-eclampsia, unspecified trimester: Secondary | ICD-10-CM

## 2019-10-15 DIAGNOSIS — O26833 Pregnancy related renal disease, third trimester: Secondary | ICD-10-CM | POA: Diagnosis not present

## 2019-10-15 DIAGNOSIS — O24313 Unspecified pre-existing diabetes mellitus in pregnancy, third trimester: Secondary | ICD-10-CM

## 2019-10-15 DIAGNOSIS — I517 Cardiomegaly: Secondary | ICD-10-CM | POA: Diagnosis present

## 2019-10-15 DIAGNOSIS — Z6791 Unspecified blood type, Rh negative: Secondary | ICD-10-CM | POA: Diagnosis not present

## 2019-10-15 DIAGNOSIS — E10649 Type 1 diabetes mellitus with hypoglycemia without coma: Secondary | ICD-10-CM | POA: Diagnosis present

## 2019-10-15 DIAGNOSIS — O9081 Anemia of the puerperium: Secondary | ICD-10-CM | POA: Diagnosis not present

## 2019-10-15 DIAGNOSIS — O9912 Other diseases of the blood and blood-forming organs and certain disorders involving the immune mechanism complicating childbirth: Secondary | ICD-10-CM | POA: Diagnosis present

## 2019-10-15 DIAGNOSIS — Z79899 Other long term (current) drug therapy: Secondary | ICD-10-CM

## 2019-10-15 DIAGNOSIS — F419 Anxiety disorder, unspecified: Secondary | ICD-10-CM | POA: Diagnosis present

## 2019-10-15 DIAGNOSIS — N183 Chronic kidney disease, stage 3 unspecified: Secondary | ICD-10-CM | POA: Diagnosis present

## 2019-10-15 DIAGNOSIS — O864 Pyrexia of unknown origin following delivery: Secondary | ICD-10-CM | POA: Diagnosis not present

## 2019-10-15 DIAGNOSIS — F329 Major depressive disorder, single episode, unspecified: Secondary | ICD-10-CM | POA: Diagnosis present

## 2019-10-15 DIAGNOSIS — O99344 Other mental disorders complicating childbirth: Secondary | ICD-10-CM | POA: Diagnosis present

## 2019-10-15 DIAGNOSIS — D696 Thrombocytopenia, unspecified: Secondary | ICD-10-CM | POA: Diagnosis present

## 2019-10-15 DIAGNOSIS — E1021 Type 1 diabetes mellitus with diabetic nephropathy: Secondary | ICD-10-CM | POA: Diagnosis present

## 2019-10-15 DIAGNOSIS — O219 Vomiting of pregnancy, unspecified: Secondary | ICD-10-CM

## 2019-10-15 DIAGNOSIS — O26893 Other specified pregnancy related conditions, third trimester: Secondary | ICD-10-CM | POA: Diagnosis present

## 2019-10-15 DIAGNOSIS — O24013 Pre-existing diabetes mellitus, type 1, in pregnancy, third trimester: Secondary | ICD-10-CM | POA: Diagnosis not present

## 2019-10-15 DIAGNOSIS — O1493 Unspecified pre-eclampsia, third trimester: Secondary | ICD-10-CM

## 2019-10-15 DIAGNOSIS — I313 Pericardial effusion (noninflammatory): Secondary | ICD-10-CM | POA: Diagnosis present

## 2019-10-15 DIAGNOSIS — A6000 Herpesviral infection of urogenital system, unspecified: Secondary | ICD-10-CM | POA: Diagnosis present

## 2019-10-15 DIAGNOSIS — O403XX0 Polyhydramnios, third trimester, not applicable or unspecified: Secondary | ICD-10-CM | POA: Diagnosis not present

## 2019-10-15 DIAGNOSIS — O113 Pre-existing hypertension with pre-eclampsia, third trimester: Secondary | ICD-10-CM

## 2019-10-15 DIAGNOSIS — E1022 Type 1 diabetes mellitus with diabetic chronic kidney disease: Secondary | ICD-10-CM | POA: Diagnosis present

## 2019-10-15 DIAGNOSIS — O1413 Severe pre-eclampsia, third trimester: Secondary | ICD-10-CM

## 2019-10-15 DIAGNOSIS — O2402 Pre-existing diabetes mellitus, type 1, in childbirth: Secondary | ICD-10-CM | POA: Diagnosis present

## 2019-10-15 DIAGNOSIS — K5900 Constipation, unspecified: Secondary | ICD-10-CM | POA: Diagnosis present

## 2019-10-15 DIAGNOSIS — O1490 Unspecified pre-eclampsia, unspecified trimester: Secondary | ICD-10-CM

## 2019-10-15 DIAGNOSIS — Z794 Long term (current) use of insulin: Secondary | ICD-10-CM | POA: Diagnosis not present

## 2019-10-15 DIAGNOSIS — N179 Acute kidney failure, unspecified: Secondary | ICD-10-CM | POA: Diagnosis not present

## 2019-10-15 DIAGNOSIS — O133 Gestational [pregnancy-induced] hypertension without significant proteinuria, third trimester: Secondary | ICD-10-CM | POA: Diagnosis not present

## 2019-10-15 DIAGNOSIS — Z3490 Encounter for supervision of normal pregnancy, unspecified, unspecified trimester: Secondary | ICD-10-CM

## 2019-10-15 DIAGNOSIS — Z7982 Long term (current) use of aspirin: Secondary | ICD-10-CM

## 2019-10-15 DIAGNOSIS — Z20822 Contact with and (suspected) exposure to covid-19: Secondary | ICD-10-CM | POA: Diagnosis present

## 2019-10-15 DIAGNOSIS — O134 Gestational [pregnancy-induced] hypertension without significant proteinuria, complicating childbirth: Secondary | ICD-10-CM | POA: Diagnosis present

## 2019-10-15 HISTORY — DX: Unspecified pre-eclampsia, unspecified trimester: O14.90

## 2019-10-15 HISTORY — DX: Patient's other noncompliance with medication regimen: Z91.14

## 2019-10-15 HISTORY — DX: Type 2 diabetes mellitus with ketoacidosis without coma: E11.10

## 2019-10-15 HISTORY — DX: Anemia, unspecified: D64.9

## 2019-10-15 HISTORY — DX: Muscle spasm of back: M62.830

## 2019-10-15 HISTORY — DX: Patient's other noncompliance with medication regimen for other reason: Z91.148

## 2019-10-15 HISTORY — DX: Hypokalemia: E87.6

## 2019-10-15 HISTORY — DX: Elevated white blood cell count, unspecified: D72.829

## 2019-10-15 HISTORY — DX: Anxiety disorder, unspecified: F41.9

## 2019-10-15 HISTORY — DX: Chronic kidney disease, stage 3 unspecified: N18.30

## 2019-10-15 HISTORY — DX: Herpesviral infection, unspecified: B00.9

## 2019-10-15 LAB — COMPREHENSIVE METABOLIC PANEL
ALT: 14 U/L (ref 0–44)
AST: 24 U/L (ref 15–41)
Albumin: 1.7 g/dL — ABNORMAL LOW (ref 3.5–5.0)
Alkaline Phosphatase: 69 U/L (ref 38–126)
Anion gap: 7 (ref 5–15)
BUN: 12 mg/dL (ref 6–20)
CO2: 22 mmol/L (ref 22–32)
Calcium: 8.1 mg/dL — ABNORMAL LOW (ref 8.9–10.3)
Chloride: 106 mmol/L (ref 98–111)
Creatinine, Ser: 1.46 mg/dL — ABNORMAL HIGH (ref 0.44–1.00)
GFR calc Af Amer: 58 mL/min — ABNORMAL LOW (ref >60)
GFR calc non Af Amer: 50 mL/min — ABNORMAL LOW (ref >60)
Glucose, Bld: 97 mg/dL (ref 70–99)
Potassium: 4.7 mmol/L (ref 3.5–5.1)
Sodium: 135 mmol/L (ref 135–145)
Total Bilirubin: 0.8 mg/dL (ref 0.3–1.2)
Total Protein: 5.1 g/dL — ABNORMAL LOW (ref 6.5–8.1)

## 2019-10-15 LAB — CBC
HCT: 26.1 % — ABNORMAL LOW (ref 36.0–46.0)
Hemoglobin: 8.9 g/dL — ABNORMAL LOW (ref 12.0–15.0)
MCH: 31.8 pg (ref 26.0–34.0)
MCHC: 34.1 g/dL (ref 30.0–36.0)
MCV: 93.2 fL (ref 80.0–100.0)
Platelets: 133 10*3/uL — ABNORMAL LOW (ref 150–400)
RBC: 2.8 MIL/uL — ABNORMAL LOW (ref 3.87–5.11)
RDW: 15.4 % (ref 11.5–15.5)
WBC: 7.2 10*3/uL (ref 4.0–10.5)
nRBC: 0 % (ref 0.0–0.2)

## 2019-10-15 LAB — BPAM RBC
Blood Product Expiration Date: 202102192359
Blood Product Expiration Date: 202102212359
ISSUE DATE / TIME: 202102152302
ISSUE DATE / TIME: 202102160231
Unit Type and Rh: 9500
Unit Type and Rh: 9500

## 2019-10-15 LAB — URINALYSIS, ROUTINE W REFLEX MICROSCOPIC
Bilirubin Urine: NEGATIVE
Glucose, UA: NEGATIVE mg/dL
Hgb urine dipstick: NEGATIVE
Ketones, ur: NEGATIVE mg/dL
Nitrite: NEGATIVE
Protein, ur: 100 mg/dL — AB
Specific Gravity, Urine: 1.011 (ref 1.005–1.030)
pH: 7 (ref 5.0–8.0)

## 2019-10-15 LAB — TYPE AND SCREEN
ABO/RH(D): O NEG
Antibody Screen: POSITIVE
Unit division: 0
Unit division: 0

## 2019-10-15 LAB — PROTEIN / CREATININE RATIO, URINE
Creatinine, Urine: 74.03 mg/dL
Protein Creatinine Ratio: 6.08 mg/mg{Cre} — ABNORMAL HIGH (ref 0.00–0.15)
Total Protein, Urine: 450 mg/dL

## 2019-10-15 LAB — GLUCOSE, CAPILLARY
Glucose-Capillary: 84 mg/dL (ref 70–99)
Glucose-Capillary: 258 mg/dL — ABNORMAL HIGH (ref 70–99)

## 2019-10-15 MED ORDER — PRENATAL MULTIVITAMIN CH
1.0000 | ORAL_TABLET | Freq: Every day | ORAL | Status: DC
  Administered 2019-10-16 – 2019-10-20 (×4): 1 via ORAL
  Filled 2019-10-15 (×4): qty 1

## 2019-10-15 MED ORDER — BETAMETHASONE SOD PHOS & ACET 6 (3-3) MG/ML IJ SUSP
12.0000 mg | INTRAMUSCULAR | Status: AC
  Administered 2019-10-15 – 2019-10-16 (×2): 12 mg via INTRAMUSCULAR
  Filled 2019-10-15: qty 5

## 2019-10-15 MED ORDER — FAMOTIDINE IN NACL 20-0.9 MG/50ML-% IV SOLN
20.0000 mg | Freq: Once | INTRAVENOUS | Status: AC
  Administered 2019-10-15: 16:00:00 20 mg via INTRAVENOUS
  Filled 2019-10-15: qty 50

## 2019-10-15 MED ORDER — LABETALOL HCL 5 MG/ML IV SOLN: 40.0000 mg | INTRAVENOUS | Status: DC | PRN

## 2019-10-15 MED ORDER — CALCIUM CARBONATE ANTACID 500 MG PO CHEW: 2.0000 | CHEWABLE_TABLET | ORAL | Status: DC | PRN

## 2019-10-15 MED ORDER — LACTATED RINGERS IV BOLUS: 500.0000 mL | Freq: Once | INTRAVENOUS | Status: DC

## 2019-10-15 MED ORDER — NIFEDIPINE ER OSMOTIC RELEASE 30 MG PO TB24
60.0000 mg | ORAL_TABLET | Freq: Every day | ORAL | Status: DC
  Administered 2019-10-15 – 2019-10-20 (×6): 60 mg via ORAL
  Filled 2019-10-15 (×6): qty 2

## 2019-10-15 MED ORDER — MAGNESIUM SULFATE BOLUS VIA INFUSION
4.0000 g | Freq: Once | INTRAVENOUS | Status: DC
  Filled 2019-10-15: qty 1000

## 2019-10-15 MED ORDER — LABETALOL HCL 5 MG/ML IV SOLN: 80.0000 mg | INTRAVENOUS | Status: DC | PRN

## 2019-10-15 MED ORDER — POLYSACCHARIDE IRON COMPLEX 150 MG PO CAPS
150.0000 mg | ORAL_CAPSULE | Freq: Every day | ORAL | Status: DC
  Administered 2019-10-16 – 2019-10-19 (×4): 150 mg via ORAL
  Filled 2019-10-15 (×4): qty 1

## 2019-10-15 MED ORDER — ZOLPIDEM TARTRATE 5 MG PO TABS
5.0000 mg | ORAL_TABLET | Freq: Every evening | ORAL | Status: DC | PRN
  Administered 2019-10-16 – 2019-10-17 (×2): 5 mg via ORAL
  Filled 2019-10-15 (×3): qty 1

## 2019-10-15 MED ORDER — NIFEDIPINE 10 MG PO CAPS
10.0000 mg | ORAL_CAPSULE | Freq: Once | ORAL | Status: AC
  Administered 2019-10-15: 10 mg via ORAL
  Filled 2019-10-15: qty 1

## 2019-10-15 MED ORDER — MAGNESIUM SULFATE 40 GM/1000ML IV SOLN: 2.0000 g/h | INTRAVENOUS | Status: DC

## 2019-10-15 MED ORDER — HYDRALAZINE HCL 20 MG/ML IJ SOLN: 10.0000 mg | INTRAMUSCULAR | Status: DC | PRN

## 2019-10-15 MED ORDER — LACTATED RINGERS IV BOLUS
1000.0000 mL | Freq: Once | INTRAVENOUS | Status: AC
  Administered 2019-10-15: 1000 mL via INTRAVENOUS

## 2019-10-15 MED ORDER — INSULIN ASPART 100 UNIT/ML ~~LOC~~ SOLN
10.0000 [IU] | Freq: Three times a day (TID) | SUBCUTANEOUS | Status: DC
  Administered 2019-10-16: 10 [IU] via SUBCUTANEOUS

## 2019-10-15 MED ORDER — INSULIN ASPART 100 UNIT/ML ~~LOC~~ SOLN
0.0000 [IU] | Freq: Three times a day (TID) | SUBCUTANEOUS | Status: DC
  Administered 2019-10-15: 6 [IU] via SUBCUTANEOUS

## 2019-10-15 MED ORDER — ACETAMINOPHEN 325 MG PO TABS: 650.0000 mg | ORAL_TABLET | ORAL | Status: DC | PRN

## 2019-10-15 MED ORDER — VALACYCLOVIR HCL 500 MG PO TABS
500.0000 mg | ORAL_TABLET | Freq: Every day | ORAL | Status: DC
  Administered 2019-10-16 – 2019-10-22 (×7): 500 mg via ORAL
  Filled 2019-10-15 (×7): qty 1

## 2019-10-15 MED ORDER — LACTATED RINGERS IV BOLUS
500.0000 mL | Freq: Once | INTRAVENOUS | Status: AC
  Administered 2019-10-15: 500 mL via INTRAVENOUS

## 2019-10-15 MED ORDER — LABETALOL HCL 5 MG/ML IV SOLN
20.0000 mg | INTRAVENOUS | Status: DC | PRN
  Administered 2019-10-15: 20 mg via INTRAVENOUS
  Filled 2019-10-15: qty 4

## 2019-10-15 MED ORDER — ESCITALOPRAM OXALATE 10 MG PO TABS
10.0000 mg | ORAL_TABLET | Freq: Every day | ORAL | Status: DC
  Administered 2019-10-15 – 2019-10-20 (×6): 10 mg via ORAL
  Filled 2019-10-15 (×6): qty 1

## 2019-10-15 MED ORDER — INSULIN NPH (HUMAN) (ISOPHANE) 100 UNIT/ML ~~LOC~~ SUSP
20.0000 [IU] | Freq: Two times a day (BID) | SUBCUTANEOUS | Status: DC
  Administered 2019-10-15 – 2019-10-16 (×2): 20 [IU] via SUBCUTANEOUS
  Filled 2019-10-15: qty 10

## 2019-10-15 MED ORDER — DOCUSATE SODIUM 100 MG PO CAPS
100.0000 mg | ORAL_CAPSULE | Freq: Every day | ORAL | Status: DC
  Administered 2019-10-17 – 2019-10-21 (×5): 100 mg via ORAL
  Filled 2019-10-15 (×5): qty 1

## 2019-10-15 MED ORDER — LABETALOL HCL 5 MG/ML IV SOLN: 20.0000 mg | INTRAVENOUS | Status: DC | PRN

## 2019-10-15 NOTE — MAU Note (Signed)
Presents with c/o N&V and visual disturbances "floaties".  Reports has vomited 3 times in 24 hours.  States taken meds for elevated BP, hasn't taken because hasn't picked up Rx. Denies VB or LOF.  Endorses +FM. In [facility] yesterday for blood transfusion, low Hgb.

## 2019-10-15 NOTE — MAU Note (Signed)
Provider informed of blood pressures.

## 2019-10-15 NOTE — H&P (Signed)
Kathy Lewis is a 24 y.o. female G2 P0010 @ 29 5/7 weeks with a h/o Type I Diabetes, Stage 2 Nephropathy, Gestational HTN presented to MAU with c/o "red floaties" in her right eye.  Pt had a blood transfusion, 2 units PRBCs yesterday.  Pt had been prescribed Procardia XL 30 mg, which she has not taken today b/c she did not pick it up from the pharmacy.  She currently denies headaches or upper abdominal pain.  Contractions were noted every 2-3 minutes on arrival but pt denied feeling contractions. She does not have any vaginal bleeding or LOF.  She has good fetal movement.  PNC with [facility] has been complicated by the following:  Type I DM-Uncontrolled prior to Pregnancy, has been managed by Dr. Suzette Battiest.  Hg A1C 04/27/20 11.4 (5 weeks), 08/24/2020 6.6  Normal eye exam.  Normal fetal anatomy and fetal cardiac echo Nephropathy, Stage II  Diagnosed @ 6 weeks  PCR    04/29/19- 3.1 ([facility])   10/13/19-4.8 ([facility])   This admission 6.1 +Chlamydia and Trich Anemia -Hg 10.4 at onset of pregnancy, 6.4 prior to blood transfusion.  Anxiety-Started on Zoloft during pregnancy, discontinued due to side effects.  On Lexapro 10 mg, tolerating well. N/V or pregnancy  Rh Negative- s/p Rhogam H/o HSV  OB History    Gravida  2   Para      Term      Preterm      AB  1   Living        SAB  1   TAB      Ectopic      Multiple      Live Births             Past Medical History:  Diagnosis Date  . Chronic kidney disease (CKD), stage II (mild)   . Diabetes mellitus without complication [facility])    Past Surgical History:  Procedure Laterality Date  . NO PAST SURGERIES     Family History: family history is not on file. Social History:  reports that she has quit smoking. She has never used smokeless tobacco. She reports that she does not drink alcohol or use drugs.     Maternal Diabetes: Yes:  Diabetes Type:  Pre-pregnancy Genetic Screening: Normal Maternal Ultrasounds/Referrals:  Normal Fetal Ultrasounds or other Referrals:  Fetal echo, Referred to Materal Fetal Medicine  Maternal Substance Abuse:  Yes:  Type: Smoker, Other: Quit when she found out she was pregnant Significant Maternal Medications:  Meds include: Other: Lexapro Significant Maternal Lab Results:  Rh negative Other Comments:  See HPI  Review of Systems  Respiratory: Negative for shortness of breath.   Gastrointestinal: Positive for diarrhea and vomiting. Negative for abdominal pain.  Neurological: Negative for dizziness and headaches.   Maternal Medical History:  Fetal activity: Perceived fetal activity is normal.    Prenatal complications: PIH.   Prenatal Complications - Diabetes: type 1. Diabetes is managed by intensive insulin program.      Dilation: Closed Effacement (%): 50 Station: -2 Exam by:: R Renato Battles, CNM Blood pressure (!) 164/90, pulse 88, temperature 98.3 F (36.8 C), temperature source Oral, resp. rate 16, height 5' (1.524 m), weight 79.8 kg, last menstrual period 03/14/2019, SpO2 100 %. Maternal Exam:  Uterine Assessment: Contraction strength is mild.  Contraction frequency is irregular.   Abdomen: Patient reports no abdominal tenderness. Cervix: Closed per MAU staff  Fetal Exam Fetal Monitor Review: Mode: hand-held doppler probe and fetoscope.   Baseline  rate: 130s.  Variability: moderate (6-25 bpm).   Pattern: accelerations present and no decelerations.    Fetal State Assessment: Category I - tracings are normal.     Physical Exam  Constitutional: She is oriented to person, place, and time. She appears well-developed and well-nourished.  HENT:  Head: Normocephalic and atraumatic.  Eyes: EOM are normal.  Respiratory: Effort normal. No respiratory distress.  GI: There is no abdominal tenderness.  Musculoskeletal:        General: No tenderness. Normal range of motion.     Cervical back: Normal range of motion.  Neurological: She is alert and oriented to person,  place, and time. She has normal reflexes.  Skin: Skin is warm and dry.  Psychiatric: She has a normal mood and affect.    CBC Latest Ref Rng & Units 10/15/2019 10/13/2019 07/07/2019  WBC 4.0 - 10.5 K/uL 7.2 7.2 6.2  Hemoglobin 12.0 - 15.0 g/dL 8.9(L) 6.5(LL) 8.0(L)  Hematocrit 36.0 - 46.0 % 26.1(L) 19.6(L) 23.4(L)  Platelets 150 - 400 K/uL 133(L) 142(L) 216   CMP Latest Ref Rng & Units 10/15/2019 07/07/2019 06/17/2019  Glucose 70 - 99 mg/dL 97 202(H) 174(H)  BUN 6 - 20 mg/dL 12 15 15   Creatinine 0.44 - 1.00 mg/dL 1.46(H) 1.26(H) 1.55(H)  Sodium 135 - 145 mmol/L 135 133(L) 136  Potassium 3.5 - 5.1 mmol/L 4.7 3.9 3.9  Chloride 98 - 111 mmol/L 106 104 105  CO2 22 - 32 mmol/L 22 22 25   Calcium 8.9 - 10.3 mg/dL 8.1(L) 8.2(L) 8.1(L)  Total Protein 6.5 - 8.1 g/dL 5.1(L) - 5.9(L)  Total Bilirubin 0.3 - 1.2 mg/dL 0.8 - 0.1(L)  Alkaline Phos 38 - 126 U/L 69 - 48  AST 15 - 41 U/L 24 - 12(L)  ALT 0 - 44 U/L 14 - 7   Urine culture 10/13/19 Negative.   Prenatal labs: ABO, Rh: --/--/O NEG (02/15 2111) Antibody: POS (02/15 2105) Rubella:   RPR:    HBsAg:    HIV:    GBS:     Assessment/Plan: IUP @ 29 5/7 weeks H/o Gestational HTN now with concern of Preeclampsia.  Due to h/o significant proteinuria due to renal failure, it is challenging to tease out  Preeclampsia. HTN exacerbated due to blood transfusion due to increased intravascular  Volume.  Start Procardia XL 60 mg now.   Type I DM-controlled with [place] insulin  Hypoglycemic episode 2 days ago and recommendations given to decrease insulin.  I will continue her current home dosage b/c her blood sugar will increase with BMZ.  Decrease 24 hours after second BMZ dose.  CBGs fasting 2 hour postprandial.  Sliding scale insulin, 0-14 units   EKG to assess cardiac status due to DM, previously recommended.  BMZ if  Contractions-asymptomatic  Procardia immediate release now.   Check wet prep, GC/Chl.  Neg UTI by culture  10/13/19.   Nephropathy  Complete 24 hour protein and creatinine clearance.  Keep BP less than 140/90.  Anemia- S/p 2 units PRBCs.  Iron daily  Consider erythropoietin.    Anxiety  Continue Lexapro 10 mg daily.  H/o HSV Valtrex daily.  Discussed pt with Dr. Gertie Exon, MFM.  Recommended holding Magnesium unless pt has a headache unrelieved with Tylenol or neuroprotection for pending delivery.  Increase po meds to control BP.  BPP or growth ultrasound if due.  MFM consult tomorrow.  Dr. Landry Mellow covering until 7 am 10/16/19 and has been updated.  Pt aware.    [name]  10/15/2019, 6:29 PM

## 2019-10-15 NOTE — MAU Provider Note (Signed)
History     CSN: [id number]  Arrival date and time: 10/15/19 1334   First Provider Initiated Contact with Patient 10/15/19 1455      Chief Complaint  Patient presents with  . Emesis  . Visual Disturbances   HPI  [name] is a 24 y.o. year old G17P0010 female at [redacted]w[redacted]d weeks gestation who presents to MAU reporting N/V (vomited 3x in 24 hours), visual disturbances. She has Type 1 Diabetes on insulin. She received a blood transfusion of 2 PRBCs for severe anemia; HgB 6.4. Her high risk pregnancy is managed by [facility]. She currently has nephropathy. Per her OB provider, she has current proteinuria; ~5000 gm of protein at NOB. She is currently in process of collecting a 24 hr urine.  Past Medical History:  Diagnosis Date  . Chronic kidney disease (CKD), stage II (mild)   . Diabetes mellitus without complication [facility]     Past Surgical History:  Procedure Laterality Date  . NO PAST SURGERIES      History reviewed. No pertinent family history.  Social History   Tobacco Use  . Smoking status: Former Research scientist (life sciences)  . Smokeless tobacco: Never Used  Substance Use Topics  . Alcohol use: No  . Drug use: No    Allergies: No Known Allergies  Medications Prior to Admission  Medication Sig Dispense Refill Last Dose  . ASPIRIN 81 PO Take by mouth.   10/14/2019 at Unknown time  . butalbital-acetaminophen-caffeine (FIORICET) 50-325-40 MG tablet Take 1 tablet by mouth every 4 (four) hours as needed for headache or migraine. 30 tablet 1 Past Week at Unknown time  . insulin aspart (NOVOLOG) 100 UNIT/ML injection Inject 5-10 Units into the skin 3 (three) times daily with meals. Adjust per carb intake 10 mL 11 10/15/2019 at Unknown time  . insulin NPH Human (NOVOLIN N) 100 UNIT/ML injection Inject 0.15 mLs (15 Units total) into the skin 2 (two) times daily at 8 am and 10 pm. 10 mL 11 10/15/2019 at Unknown time  . Prenatal Vit-Fe Fumarate-FA (MULTIVITAMIN-PRENATAL) 27-0.8 MG TABS tablet  Take 1 tablet by mouth daily at 12 noon.   10/14/2019 at Unknown time  . acetaminophen (TYLENOL) 500 MG tablet Take 500 mg by mouth every 6 (six) hours as needed for mild pain.     . metroNIDAZOLE (FLAGYL) 500 MG tablet Take 2,000 mg by mouth once.    at not taking  . ondansetron (ZOFRAN) 4 MG tablet Take 1 tablet (4 mg total) by mouth every 8 (eight) hours as needed for nausea or vomiting. 30 tablet 2  at not taking  . promethazine (PHENERGAN) 12.5 MG tablet Take 1 tablet (12.5 mg total) by mouth every 6 (six) hours as needed for nausea or vomiting. 30 tablet 1  at not taking  . sertraline (ZOLOFT) 25 MG tablet Take 1 tablet (25 mg total) by mouth daily. (Patient not taking: Reported on 06/03/2019)       Review of Systems  Constitutional: Negative.   HENT: Negative.   Eyes: Negative.   Respiratory: Negative.   Cardiovascular: Negative.   Gastrointestinal: Positive for nausea and vomiting.  Endocrine: Negative.   Genitourinary: Negative.   Musculoskeletal: Negative.   Skin: Negative.   Allergic/Immunologic: Negative.   Neurological: Negative.   Hematological: Negative.   Psychiatric/Behavioral: Negative.    Physical Exam   Blood pressure 137/84, pulse 89, temperature 98.8 F (37.1 C), temperature source Oral, resp. rate 18, height 5' (1.524 m), weight 79.8 kg, last  menstrual period 03/14/2019, SpO2 97 %.  Physical Exam  Nursing note and vitals reviewed. Constitutional: She is oriented to person, place, and time. She appears well-developed and well-nourished.  HENT:  Head: Normocephalic and atraumatic.  Eyes: Pupils are equal, round, and reactive to light.  Cardiovascular: Normal rate.  Respiratory: Effort normal.  GI: Soft.  Genitourinary:    Genitourinary Comments: Dilation: Closed Effacement (%): 50 Station: -2 Presentation: Undeterminable Exam by: Sunday Corn, CNM    Musculoskeletal:        General: Normal range of motion.     Cervical back: Normal range of motion.   Neurological: She is alert and oriented to person, place, and time.  Skin: Skin is warm and dry.  Psychiatric: She has a normal mood and affect. Her behavior is normal. Judgment and thought content normal.   NST - FHR: 130 bpm / moderate variability / accels present / decels absent / TOCO: regular every 2-7 mins   MAU Course  Procedures  MDM CCUA CBC CMP P/C Ratio Serial BP's   Results for orders placed or performed during the hospital encounter of 10/15/19 (from the past 24 hour(s))  Urinalysis, Routine w reflex microscopic     Status: Abnormal   Collection Time: 10/15/19  2:00 PM  Result Value Ref Range   Color, Urine YELLOW YELLOW   APPearance CLOUDY (A) CLEAR   Specific Gravity, Urine 1.011 1.005 - 1.030   pH 7.0 5.0 - 8.0   Glucose, UA NEGATIVE NEGATIVE mg/dL   Hgb urine dipstick NEGATIVE NEGATIVE   Bilirubin Urine NEGATIVE NEGATIVE   Ketones, ur NEGATIVE NEGATIVE mg/dL   Protein, ur 100 (A) NEGATIVE mg/dL   Nitrite NEGATIVE NEGATIVE   Leukocytes,Ua SMALL (A) NEGATIVE   RBC / HPF 0-5 0 - 5 RBC/hpf   WBC, UA 11-20 0 - 5 WBC/hpf   Bacteria, UA FEW (A) NONE SEEN   Squamous Epithelial / LPF 11-20 0 - 5   Mucus PRESENT    Hyaline Casts, UA PRESENT   Protein / creatinine ratio, urine     Status: Abnormal   Collection Time: 10/15/19  2:19 PM  Result Value Ref Range   Creatinine, Urine 74.03 mg/dL   Total Protein, Urine 450 mg/dL   Protein Creatinine Ratio 6.08 (H) 0.00 - 0.15 mg/mg[Cre]  CBC     Status: Abnormal   Collection Time: 10/15/19  2:34 PM  Result Value Ref Range   WBC 7.2 4.0 - 10.5 K/uL   RBC 2.80 (L) 3.87 - 5.11 MIL/uL   Hemoglobin 8.9 (L) 12.0 - 15.0 g/dL   HCT 26.1 (L) 36.0 - 46.0 %   MCV 93.2 80.0 - 100.0 fL   MCH 31.8 26.0 - 34.0 pg   MCHC 34.1 30.0 - 36.0 g/dL   RDW 15.4 11.5 - 15.5 %   Platelets 133 (L) 150 - 400 K/uL   nRBC 0.0 0.0 - 0.2 %  Comprehensive metabolic panel     Status: Abnormal   Collection Time: 10/15/19  2:34 PM  Result Value  Ref Range   Sodium 135 135 - 145 mmol/L   Potassium 4.7 3.5 - 5.1 mmol/L   Chloride 106 98 - 111 mmol/L   CO2 22 22 - 32 mmol/L   Glucose, Bld 97 70 - 99 mg/dL   BUN 12 6 - 20 mg/dL   Creatinine, Ser 1.46 (H) 0.44 - 1.00 mg/dL   Calcium 8.1 (L) 8.9 - 10.3 mg/dL   Total Protein 5.1 (L) 6.5 -  8.1 g/dL   Albumin 1.7 (L) 3.5 - 5.0 g/dL   AST 24 15 - 41 U/L   ALT 14 0 - 44 U/L   Alkaline Phosphatase 69 38 - 126 U/L   Total Bilirubin 0.8 0.3 - 1.2 mg/dL   GFR calc non Af Amer 50 (L) >60 mL/min   GFR calc Af Amer 58 (L) >60 mL/min   Anion gap 7 5 - 15  Glucose, capillary     Status: None   Collection Time: 10/15/19  2:39 PM  Result Value Ref Range   Glucose-Capillary 84 70 - 99 mg/dL     *Consult with Dr. Hulan Fray @ 1717 - notified of patient's complaints, assessments, lab & NST results, recommended tx plan admission, call Dr. Simona Huh  *Consult with Dr. Simona Huh @ 1719 - notified of patient's complaints, assessments, lab, NST, & cervical check results, tx plan will admit patient and enter orders for admission   Assessment and Plan  Pre-eclampsia in third trimester - Admit to [facility] for PEC with severe features - Admission orders placed by Dr. Simona Huh - Dr. Simona Huh assumes care of patient at 1719 - Discussed with patient that admission is for BP management and possible prevention of seizure from extremely high BPs  Laury Deep, MSN, CNM 10/15/2019, 3:29 PM

## 2019-10-16 LAB — COMPREHENSIVE METABOLIC PANEL
ALT: 15 U/L (ref 0–44)
AST: 18 U/L (ref 15–41)
Albumin: 1.7 g/dL — ABNORMAL LOW (ref 3.5–5.0)
Alkaline Phosphatase: 63 U/L (ref 38–126)
Anion gap: 10 (ref 5–15)
BUN: 15 mg/dL (ref 6–20)
CO2: 19 mmol/L — ABNORMAL LOW (ref 22–32)
Calcium: 8 mg/dL — ABNORMAL LOW (ref 8.9–10.3)
Chloride: 105 mmol/L (ref 98–111)
Creatinine, Ser: 1.81 mg/dL — ABNORMAL HIGH (ref 0.44–1.00)
GFR calc Af Amer: 45 mL/min — ABNORMAL LOW (ref >60)
GFR calc non Af Amer: 39 mL/min — ABNORMAL LOW (ref >60)
Glucose, Bld: 304 mg/dL — ABNORMAL HIGH (ref 70–99)
Potassium: 4.9 mmol/L (ref 3.5–5.1)
Sodium: 134 mmol/L — ABNORMAL LOW (ref 135–145)
Total Bilirubin: 0.6 mg/dL (ref 0.3–1.2)
Total Protein: 4.9 g/dL — ABNORMAL LOW (ref 6.5–8.1)

## 2019-10-16 LAB — CREATININE CLEARANCE, URINE, 24 HOUR
Collection Interval-CRCL: 24 hours
Creatinine Clearance: 46 mL/min — ABNORMAL LOW (ref 75–115)
Creatinine, 24H Ur: 1190 mg/d (ref 600–1800)
Creatinine, Urine: 91.53 mg/dL
Urine Total Volume-CRCL: 1300 mL

## 2019-10-16 LAB — CBC
HCT: 25.2 % — ABNORMAL LOW (ref 36.0–46.0)
Hemoglobin: 8.4 g/dL — ABNORMAL LOW (ref 12.0–15.0)
MCH: 30.9 pg (ref 26.0–34.0)
MCHC: 33.3 g/dL (ref 30.0–36.0)
MCV: 92.6 fL (ref 80.0–100.0)
Platelets: 123 10*3/uL — ABNORMAL LOW (ref 150–400)
RBC: 2.72 MIL/uL — ABNORMAL LOW (ref 3.87–5.11)
RDW: 15.2 % (ref 11.5–15.5)
WBC: 8.4 10*3/uL (ref 4.0–10.5)
nRBC: 0 % (ref 0.0–0.2)

## 2019-10-16 LAB — WET PREP, GENITAL
Sperm: NONE SEEN
Trich, Wet Prep: NONE SEEN
Yeast Wet Prep HPF POC: NONE SEEN

## 2019-10-16 LAB — GLUCOSE, CAPILLARY
Glucose-Capillary: 227 mg/dL — ABNORMAL HIGH (ref 70–99)
Glucose-Capillary: 124 mg/dL — ABNORMAL HIGH (ref 70–99)
Glucose-Capillary: 91 mg/dL (ref 70–99)

## 2019-10-16 LAB — PROTEIN, URINE, 24 HOUR
Collection Interval-UPROT: 24 hours
Protein, 24H Urine: 4303 mg/d — ABNORMAL HIGH (ref 50–100)
Protein, Urine: 331 mg/dL
Urine Total Volume-UPROT: 1300 mL

## 2019-10-16 LAB — LACTATE DEHYDROGENASE: LDH: 276 U/L — ABNORMAL HIGH (ref 98–192)

## 2019-10-16 LAB — URIC ACID: Uric Acid, Serum: 6.3 mg/dL (ref 2.5–7.1)

## 2019-10-16 MED ORDER — INSULIN NPH (HUMAN) (ISOPHANE) 100 UNIT/ML ~~LOC~~ SUSP
25.0000 [IU] | Freq: Two times a day (BID) | SUBCUTANEOUS | Status: DC
  Administered 2019-10-16: 25 [IU] via SUBCUTANEOUS
  Filled 2019-10-16: qty 10

## 2019-10-16 MED ORDER — ASPIRIN 81 MG PO CHEW
81.0000 mg | CHEWABLE_TABLET | Freq: Every day | ORAL | Status: DC
  Administered 2019-10-16 – 2019-10-21 (×6): 81 mg via ORAL
  Filled 2019-10-16 (×6): qty 1

## 2019-10-16 MED ORDER — LACTATED RINGERS IV BOLUS
500.0000 mL | Freq: Once | INTRAVENOUS | Status: AC
  Administered 2019-10-16: 500 mL via INTRAVENOUS

## 2019-10-16 MED ORDER — INSULIN ASPART 100 UNIT/ML ~~LOC~~ SOLN
15.0000 [IU] | Freq: Three times a day (TID) | SUBCUTANEOUS | Status: DC
  Administered 2019-10-16 (×2): 15 [IU] via SUBCUTANEOUS

## 2019-10-16 MED ORDER — INSULIN ASPART 100 UNIT/ML ~~LOC~~ SOLN
0.0000 [IU] | Freq: Three times a day (TID) | SUBCUTANEOUS | Status: DC
  Administered 2019-10-16: 6 [IU] via SUBCUTANEOUS
  Administered 2019-10-16: 2 [IU] via SUBCUTANEOUS
  Administered 2019-10-16: 3 [IU] via SUBCUTANEOUS

## 2019-10-16 MED ORDER — INSULIN NPH (HUMAN) (ISOPHANE) 100 UNIT/ML ~~LOC~~ SUSP
5.0000 [IU] | Freq: Once | SUBCUTANEOUS | Status: DC
  Filled 2019-10-16: qty 10

## 2019-10-16 MED ORDER — INSULIN ASPART 100 UNIT/ML ~~LOC~~ SOLN: 5.0000 [IU] | Freq: Once | SUBCUTANEOUS | Status: DC

## 2019-10-16 NOTE — Discharge Instructions (Addendum)
Postpartum Care After Vaginal Delivery This sheet gives you information about how to care for yourself from the time you deliver your baby to up to 6-12 weeks after delivery (postpartum period). Your health care provider may also give you more specific instructions. If you have problems or questions, contact your health care provider. Follow these instructions at home: Vaginal bleeding  It is normal to have vaginal bleeding (lochia) after delivery. Wear a sanitary pad for vaginal bleeding and discharge. ? During the first week after delivery, the amount and appearance of lochia is often similar to a menstrual period. ? Over the next few weeks, it will gradually decrease to a dry, yellow-brown discharge. ? For most women, lochia stops completely by 4-6 weeks after delivery. Vaginal bleeding can vary from woman to woman.  Change your sanitary pads frequently. Watch for any changes in your flow, such as: ? A sudden increase in volume. ? A change in color. ? Large blood clots.  If you pass a blood clot from your vagina, save it and call your health care provider to discuss. Do not flush blood clots down the toilet before talking with your health care provider.  Do not use tampons or douches until your health care provider says this is safe.  If you are not breastfeeding, your period should return 6-8 weeks after delivery. If you are feeding your child breast milk only (exclusive breastfeeding), your period may not return until you stop breastfeeding. Perineal care  Keep the area between the vagina and the anus (perineum) clean and dry as told by your health care provider. Use medicated pads and pain-relieving sprays and creams as directed.  If you had a cut in the perineum (episiotomy) or a tear in the vagina, check the area for signs of infection until you are healed. Check for: ? More redness, swelling, or pain. ? Fluid or blood coming from the cut or tear. ? Warmth. ? Pus or a bad  smell.  You may be given a squirt bottle to use instead of wiping to clean the perineum area after you go to the bathroom. As you start healing, you may use the squirt bottle before wiping yourself. Make sure to wipe gently.  To relieve pain caused by an episiotomy, a tear in the vagina, or swollen veins in the anus (hemorrhoids), try taking a warm sitz bath 2-3 times a day. A sitz bath is a warm water bath that is taken while you are sitting down. The water should only come up to your hips and should cover your buttocks. Breast care  Within the first few days after delivery, your breasts may feel heavy, full, and uncomfortable (breast engorgement). Milk may also leak from your breasts. Your health care provider can suggest ways to help relieve the discomfort. Breast engorgement should go away within a few days.  If you are breastfeeding: ? Wear a bra that supports your breasts and fits you well. ? Keep your nipples clean and dry. Apply creams and ointments as told by your health care provider. ? You may need to use breast pads to absorb milk that leaks from your breasts. ? You may have uterine contractions every time you breastfeed for up to several weeks after delivery. Uterine contractions help your uterus return to its normal size. ? If you have any problems with breastfeeding, work with your health care provider or Science writer.  If you are not breastfeeding: ? Avoid touching your breasts a lot. Doing this can make  your breasts produce more milk. ? Wear a good-fitting bra and use cold packs to help with swelling. ? Do not squeeze out (express) milk. This causes you to make more milk. Intimacy and sexuality  Ask your health care provider when you can engage in sexual activity. This may depend on: ? Your risk of infection. ? How fast you are healing. ? Your comfort and desire to engage in sexual activity.  You are able to get pregnant after delivery, even if you have not had  your period. If desired, talk with your health care provider about methods of birth control (contraception). Medicines  Take over-the-counter and prescription medicines only as told by your health care provider.  If you were prescribed an antibiotic medicine, take it as told by your health care provider. Do not stop taking the antibiotic even if you start to feel better. Activity  Gradually return to your normal activities as told by your health care provider. Ask your health care provider what activities are safe for you.  Rest as much as possible. Try to rest or take a nap while your baby is sleeping. Eating and drinking   Drink enough fluid to keep your urine pale yellow.  Eat high-fiber foods every day. These may help prevent or relieve constipation. High-fiber foods include: ? Whole grain cereals and breads. ? Brown rice. ? Beans. ? Fresh fruits and vegetables.  Do not try to lose weight quickly by cutting back on calories.  Take your prenatal vitamins until your postpartum checkup or until your health care provider tells you it is okay to stop. Lifestyle  Do not use any products that contain nicotine or tobacco, such as cigarettes and e-cigarettes. If you need help quitting, ask your health care provider.  Do not drink alcohol, especially if you are breastfeeding. General instructions  Keep all follow-up visits for you and your baby as told by your health care provider. Most women visit their health care provider for a postpartum checkup within the first 3-6 weeks after delivery. Contact a health care provider if:  You feel unable to cope with the changes that your child brings to your life, and these feelings do not go away.  You feel unusually sad or worried.  Your breasts become red, painful, or hard.  You have a fever.  You have trouble holding urine or keeping urine from leaking.  You have little or no interest in activities you used to enjoy.  You have not  breastfed at all and you have not had a menstrual period for 12 weeks after delivery.  You have stopped breastfeeding and you have not had a menstrual period for 12 weeks after you stopped breastfeeding.  You have questions about caring for yourself or your baby.  You pass a blood clot from your vagina. Get help right away if:  You have chest pain.  You have difficulty breathing.  You have sudden, severe leg pain.  You have severe pain or cramping in your lower abdomen.  You bleed from your vagina so much that you fill more than one sanitary pad in one hour. Bleeding should not be heavier than your heaviest period.  You develop a severe headache.  You faint.  You have blurred vision or spots in your vision.  You have bad-smelling vaginal discharge.  You have thoughts about hurting yourself or your baby. If you ever feel like you may hurt yourself or others, or have thoughts about taking your own life, get help  right away. You can go to the nearest emergency department or call:  Your local emergency services (911 in the [place]  A suicide crisis helpline, such as the [place] at 3200624065. This is open 24 hours a day. Summary  The period of time right after you deliver your newborn up to 6-12 weeks after delivery is called the postpartum period.  Gradually return to your normal activities as told by your health care provider.  Keep all follow-up visits for you and your baby as told by your health care provider. This information is not intended to replace advice given to you by your health care provider. Make sure you discuss any questions you have with your health care provider. Document Revised: 08/17/2017 Document Reviewed: 05/28/2017 Elsevier Patient Education  [employer].     Preeclampsia and Eclampsia Preeclampsia is a serious condition that may develop during pregnancy. This condition causes high blood pressure and increased  protein in your urine along with other symptoms, such as headaches and vision changes. These symptoms may develop as the condition gets worse. Preeclampsia may occur at 20 weeks of pregnancy or later. Diagnosing and treating preeclampsia early is very important. If not treated early, it can cause serious problems for you and your baby. One problem it can lead to is eclampsia. Eclampsia is a condition that causes muscle jerking or shaking (convulsions or seizures) and other serious problems for the mother. During pregnancy, delivering your baby may be the best treatment for preeclampsia or eclampsia. For most women, preeclampsia and eclampsia symptoms go away after giving birth. In rare cases, a woman may develop preeclampsia after giving birth (postpartum preeclampsia). This usually occurs within 48 hours after childbirth but may occur up to 6 weeks after giving birth. What are the causes? The cause of preeclampsia is not known. What increases the risk? The following risk factors make you more likely to develop preeclampsia:  Being pregnant for the first time.  Having had preeclampsia during a past pregnancy.  Having a family history of preeclampsia.  Having high blood pressure.  Being pregnant with more than one baby.  Being 42 or older.  Being African-American.  Having kidney disease or diabetes.  Having medical conditions such as lupus or blood diseases.  Being very overweight (obese). What are the signs or symptoms? The most common symptoms are:  Severe headaches.  Vision problems, such as blurred or double vision.  Abdominal pain, especially upper abdominal pain. Other symptoms that may develop as the condition gets worse include:  Sudden weight gain.  Sudden swelling of the hands, face, legs, and feet.  Severe nausea and vomiting.  Numbness in the face, arms, legs, and feet.  Dizziness.  Urinating less than usual.  Slurred speech.  Convulsions or  seizures. How is this diagnosed? There are no screening tests for preeclampsia. Your health care provider will ask you about symptoms and check for signs of preeclampsia during your prenatal visits. You may also have tests that include:  Checking your blood pressure.  Urine tests to check for protein. Your health care provider will check for this at every prenatal visit.  Blood tests.  Monitoring your baby's heart rate.  Ultrasound. How is this treated? You and your health care provider will determine the treatment approach that is best for you. Treatment may include:  Having more frequent prenatal exams to check for signs of preeclampsia, if you have an increased risk for preeclampsia.  Medicine to lower your blood pressure.  Staying in the hospital, if your condition is severe. There, treatment will focus on controlling your blood pressure and the amount of fluids in your body (fluid retention).  Taking medicine (magnesium sulfate) to prevent seizures. This may be given as an injection or through an IV.  Taking a low-dose aspirin during your pregnancy.  Delivering your baby early. You may have your labor started with medicine (induced), or you may have a cesarean delivery. Follow these instructions at home: Eating and drinking   Drink enough fluid to keep your urine pale yellow.  Avoid caffeine. Lifestyle  Do not use any products that contain nicotine or tobacco, such as cigarettes and e-cigarettes. If you need help quitting, ask your health care provider.  Do not use alcohol or drugs.  Avoid stress as much as possible. Rest and get plenty of sleep. General instructions  Take over-the-counter and prescription medicines only as told by your health care provider.  When lying down, lie on your left side. This keeps pressure off your major blood vessels.  When sitting or lying down, raise (elevate) your feet. Try putting some pillows underneath your lower  legs.  Exercise regularly. Ask your health care provider what kinds of exercise are best for you.  Keep all follow-up and prenatal visits as told by your health care provider. This is important. How is this prevented? There is no known way of preventing preeclampsia or eclampsia from developing. However, to lower your risk of complications and detect problems early:  Get regular prenatal care. Your health care provider may be able to diagnose and treat the condition early.  Maintain a healthy weight. Ask your health care provider for help managing weight gain during pregnancy.  Work with your health care provider to manage any long-term (chronic) health conditions you have, such as diabetes or kidney problems.  You may have tests of your blood pressure and kidney function after giving birth.  Your health care provider may have you take low-dose aspirin during your next pregnancy. Contact a health care provider if:  You have symptoms that your health care provider told you may require more treatment or monitoring, such as: ? Headaches. ? Nausea or vomiting. ? Abdominal pain. ? Dizziness. ? Light-headedness. Get help right away if:  You have severe: ? Abdominal pain. ? Headaches that do not get better. ? Dizziness. ? Vision problems. ? Confusion. ? Nausea or vomiting.  You have any of the following: ? A seizure. ? Sudden, rapid weight gain. ? Sudden swelling in your hands, ankles, or face. ? Trouble moving any part of your body. ? Numbness in any part of your body. ? Trouble speaking. ? Abnormal bleeding.  You faint. Summary  Preeclampsia is a serious condition that may develop during pregnancy.  This condition causes high blood pressure and increased protein in your urine along with other symptoms, such as headaches and vision changes.  Diagnosing and treating preeclampsia early is very important. If not treated early, it can cause serious problems for you and your  baby.  Get help right away if you have symptoms that your health care provider told you to watch for. This information is not intended to replace advice given to you by your health care provider. Make sure you discuss any questions you have with your health care provider. Document Revised: 04/16/2018 Document Reviewed: 03/20/2016 Elsevier Patient Education  [place].   HELLP Syndrome  HELLP syndrome is a severe complication of preeclampsia, a disorder of pregnancy that  causes high blood pressure and other serious problems. In most cases, the syndrome develops before 35 weeks of pregnancy, but it can also develop right after childbirth. The condition is life-threatening to both the mother and baby. The letters in HELLP stand for these problems:  H - Hemolytic anemia, hemolysis. This refers to the destruction of blood cells.  EL - Elevated liver enzymes. This is a sign of liver damage.  LP - Low platelet count. This refers to blood cells that help stop bleeding. What are the causes? The cause of this condition is not known. What increases the risk? You are more likely to develop this condition if:  You are pregnant with more than one baby.  You have other medical conditions, such as diabetes or an autoimmune disease.  Any of these things happened during a previous pregnancy: ? You had preeclampsia or eclampsia. ? Your baby did not grow as expected. ? Your baby was born prematurely. ? Your placenta separated from your uterus (placental abruption). ? You lost your baby (fetal death). What are the signs or symptoms? Symptoms of this condition include:  Severe headache.  Blurry or double vision.  Pain in the upper right abdomen.  Pain in the shoulder, neck, and upper body.  Fatigue.  Feeling sick.  Seizures.  Swelling of the hands, face, legs, and feet.  Sudden weight gain.  High blood pressure. How is this diagnosed? This condition may be diagnosed with  blood and urine tests. These tests may include:  A complete blood count.  A liver function test.  A kidney function test.  A measurement of the salts and other chemicals in your body (electrolytes).  A blood coagulation test.  Measurement of protein in the urine. You may also have other tests, including:  Checking your blood pressure.  Fetal ultrasound.  Monitoring your baby's heart rate. How is this treated? This condition is treated by delivering the baby as soon as possible. You may be given medicines to start contractions (induce labor) so that the baby can be delivered vaginally, or you may have a cesarean delivery. Additional treatment may include:  Receiving an infusion of magnesium sulphate before delivery. Magnesium sulphate is a medicine that helps prevent seizures.  Receiving medicines to lower and control blood pressure. These may be given if your blood pressure is too high.  Receiving steroid hormones (corticosteroids) to help your baby's lungs mature faster. During your treatment, you and your baby will be monitored and your conditions will be managed. How is this prevented? If you are at risk for preeclampsia, your health care provider may recommend that you take one low-dose aspirin (a dose of 81 mg) each day during your pregnancy. This will help prevent high blood pressure. You may be at risk for preeclampsia if:  You had preeclampsia or eclampsia during a previous pregnancy.  Your baby did not grow as expected during a previous pregnancy.  One of your children was born prematurely.  You experienced a placental abruption during a previous pregnancy.  You lost your baby during a previous pregnancy.  You are pregnant with more than one baby.  You have a medical condition, such as diabetes or an autoimmune disease. Contact a health care provider if:  You gain more weight than expected.  You have headaches.  You have nausea or vomiting.  You have  abdominal pain.  You feel dizzy or light-headed. Get help right away if:  You have a seizure.  You develop sudden or severe  swelling anywhere in your body. This usually happens in the legs.  You have changes in your ability to speak or move your body. Summary  HELLP syndrome is a severe complication of preeclampsia, a disorder of pregnancy that causes high blood pressure and other serious problems.  In most cases, the syndrome develops before 35 weeks of pregnancy, but it can also develop right after childbirth. The condition is life-threatening to both the mother and baby.  This condition is treated by delivering the baby as soon as possible. You may be given medicines to start contractions (induce labor) so that the baby can be delivered vaginally, or you may have a cesarean delivery.  During your treatment, you and your baby will be monitored and your conditions will be managed. This information is not intended to replace advice given to you by your health care provider. Make sure you discuss any questions you have with your health care provider. Document Revised: 07/27/2017 Document Reviewed: 09/20/2016 Elsevier Patient Education  2020 [employer].

## 2019-10-16 NOTE — Progress Notes (Addendum)
  Inpatient Diabetes Program Recommendations  Diabetes Treatment Program Recommendations  ADA Standards of Care 2018 Diabetes in Pregnancy Target Glucose Ranges:  Fasting: 60 - 90 mg/dL Preprandial: 60 - 105 mg/dL 1 hr postprandial: Less than 140mg /dL (from first bite of meal) 2 hr postprandial: Less than 120 mg/dL (from first bite of meal)    Lab Results  Component Value Date   GLUCAP 258 (H) 10/15/2019   HGBA1C 12.1 (H) 12/18/2014    Review of Glycemic Control Results for Kathy Lewis, Kathy Lewis (MRN [id number]) as of 10/16/2019 08:12  Ref. Range 10/15/2019 22:22  Glucose-Capillary Latest Ref Range: 70 - 99 mg/dL 258 (H)   Diabetes history: Type 1 DM Outpatient Diabetes medications: NPH 15 units BID, Novolog 5-15 units TID Current orders for Inpatient glycemic control: NPH 20 units BID, Novolog 10-15 units TID, Novolog 0-14 units TID BMZ x 1, (second dose scheduled for 2/18)  Inpatient Diabetes Program Recommendations:    In the setting of steroids, anticipate trends to further exceed target goals. Noted insulin adjustment increase, however, may require more over the next 48 hours.  Consider further increasing NPH to 25 units BID and setting meal coverage dose to Novolog 12 units TID. If glucose trends continue to exceed 300 mg/dL, would also repeat BMET.  Will continue to follow.   Thanks, Bronson Curb, MSN, RNC-OB Diabetes Coordinator [phone] ([id number]-5p)

## 2019-10-16 NOTE — Consult Note (Signed)
MFM Consult Telemedicine  Requesting provider: [name], MD Date of service: 09/15/19 Reason for request: Management of hypertension in pregnancy   Ms. Bickler is a 24 yo G2P0  African American female at 41 w 6 d admitted to the hospital for management of hypertension in pregnancy and delivery planning. She is seen in consultation at the request of Dr. [name].  Ms. Sciascia was seen in early pregnancy by Dr. Johnell Comings, a partner in our practice, and provided detailed information regarding pregnancy management please see that report for details.  Regarding this admission, Ms. Sherburne notes that she begin to develop a headache and elevated blood pressure 3 weeks ago, she was treated with Fioricet at that time. In addition, her low leg swelling returned. She has been headache free but her blood pressure has increased over the time resulting in a prescription of antihypertensive therapy on Monday 2/15. In addition, she was anemic and symptomatic with a hgb of 6.4, she received 2 PRBC  which improved her hgb to 8.9.  She has known Type 1 DM with a double dignit HgbA1c early in pregnancy. She also had stage II CKD  With a creatinine that has increased from 1.2 to 1.8. She is under the care of nephrology. Her platelets early in pregnancy were 216 in November and now 123.  Her UPC in 9/3 was 3.15 now 6.08 with a 24 hr urine of 4,303.  Her blood pressure has risen from 120/80's to now  150/90 with a few highs of 160/100's. She was started on procardia XL 30 a and now 60 XL her blood pressure has stablized  To 130's and 140's.   The fetal growth was performed on 2/17 and was normally growth at the 34% with good amniotic fluid and movement.  General history is unchanged.  ROS is noted above.  Vitals with BMI 10/16/2019 10/16/2019 10/16/2019  Height - - -  Weight - - 180 lbs 2 oz  BMI - - 0000000  Systolic 0000000 [id number]  Diastolic 83 83 84  Pulse 123456 94 98   CBC Latest Ref Rng & Units 10/16/2019  10/15/2019 10/13/2019  WBC 4.0 - 10.5 K/uL 8.4 7.2 7.2  Hemoglobin 12.0 - 15.0 g/dL 8.4(L) 8.9(L) 6.5(LL)  Hematocrit 36.0 - 46.0 % 25.2(L) 26.1(L) 19.6(L)  Platelets 150 - 400 K/uL 123(L) 133(L) 142(L)    CMP Latest Ref Rng & Units 10/16/2019 10/15/2019 07/07/2019  Glucose 70 - 99 mg/dL 304(H) 97 202(H)  BUN 6 - 20 mg/dL 15 12 15   Creatinine 0.44 - 1.00 mg/dL 1.81(H) 1.46(H) 1.26(H)  Sodium 135 - 145 mmol/L 134(L) 135 133(L)  Potassium 3.5 - 5.1 mmol/L 4.9 4.7 3.9  Chloride 98 - 111 mmol/L 105 106 104  CO2 22 - 32 mmol/L 19(L) 22 22  Calcium 8.9 - 10.3 mg/dL 8.0(L) 8.1(L) 8.2(L)  Total Protein 6.5 - 8.1 g/dL 4.9(L) 5.1(L) -  Total Bilirubin 0.3 - 1.2 mg/dL 0.6 0.8 -  Alkaline Phos 38 - 126 U/L 63 69 -  AST 15 - 41 U/L 18 24 -  ALT 0 - 44 U/L 15 14 -   Impression: Preeclampsia with severe features  I reviewed todays findings with Ms. Giorgio including the new diagnosis of preeclampsia without severe features.  It is unclear whether she has superimposed preeclampsia with or preeclampsia with severe features as Ms. Arce mentioned she has elevated blood pressure at 5 weeks that resolved. However, her blood pressure represented 3 weeks ago requiring therapy on  Monday. In addition, she had renal disease demonstrating proteinuria. In addition there is a slight drop in her platelets now down to 123 this may be gestational thrombocytopenia or the beginning of HELLP Syndrome.  However, given her recent blood pressure in the 160's I suggest that our management should be consistent with preeclampsia with severe features.  She has received her first course of betamethasone and received her second dose today.  As a result I explained that she should remain hospitalized until 34 weeks or delivery. We discussed the increased risk of eclampsia, still birth, placental abruption and cesarean delivery.  I discussed the triggers for delivery to include: 1) Uncontrolled blood pressure not responding to IV  therapy. 2) Worsening labs new on set LFT elevations or dropping platelets <100. 3) Non-reassuring fetal status. 4) Persistent headache not resolved with tyelenol  Recommendation Summary: 1) Daily NST 2) Weekly BPP 3) Growth in 3 weeks 4) Daily CBC and CMP until platelets stabilize 5) Magnesium sulfate if delivery is expected in 48 hours for neuroprotection (if < 32 weeks) and seizure prophylaxis. 6) Repeat BMZ course if > 14 days for delivery. 7) Continue blood sugar control FBS <90 with 2 hr pp < 120 mg/dL   All questions answered  I spent 40 minute with > 20 minutes in face to face consultation and care coordination.  Vikki Ports, MD.

## 2019-10-16 NOTE — Progress Notes (Addendum)
Kathy Lewis is a 24 y.o. G2P0010 at [redacted]w[redacted]d  HTN HD #2  I performed a face to face video call for ~20 minutes via D[employer] due to severe weather.  Per Dr. Landry Mellow (oncall MD), pt was given 500 ml IVF bolus x 2 due to contractions, which helped decrease contractions.  I spoke with pt's RN this am.  Blood sugar was 304 at 5 pm on CMP.  Pt was given NPH 20 and Novolog 10 at 8:45 AM with breakfast.  Blood sugar was not rechecked prior to eating.   Subjective: Pt without complaints.  She denies headaches, upper abdominal pain or swelling but she has visual changes.  She previously reported red "floaties" but she now says they are only red in the sunlight; currently they are black.  Baby is very active, denies contractions, LOF or bleeding.  Pt reported nausea and dry mouth this am which she reports is a symptom of her hyperglycemia.  Pt was previously positive for Chlamydia and Trich this pregnancy.  She reports she has not sex since she was treated.  Objective: BP 136/83 (BP Location: Left Arm)   Pulse 94   Temp 98.7 F (37.1 C) (Oral)   Resp 18   Ht 5' (1.524 m)   Wt 81.7 kg   LMP 03/14/2019 (Exact Date)   SpO2 100%   BMI 35.17 kg/m  I/O last 3 completed shifts: In: 860.5 [P.O.:360; IV Piggyback:500.5] Out: 880 [Urine:880] No intake/output data recorded.  Gen:  NAD, appears well.  Questionable periorbital edema FHT:  FHR: 130s bpm, variability: moderate,  accelerations:  Present,  decelerations:  Absent UC:   Irregular  SVE:   Dilation: Closed Effacement (%): 50 Station: -2 Exam by:: Sunday Corn, CNM   CBG (last 3)  Recent Labs    10/14/19 1014 10/15/19 1439 10/15/19 2222  GLUCAP 90 84 258*    CBC Latest Ref Rng & Units 10/16/2019 10/15/2019 10/13/2019  WBC 4.0 - 10.5 K/uL 8.4 7.2 7.2  Hemoglobin 12.0 - 15.0 g/dL 8.4(L) 8.9(L) 6.5(LL)  Hematocrit 36.0 - 46.0 % 25.2(L) 26.1(L) 19.6(L)  Platelets 150 - 400 K/uL 123(L) 133(L) 142(L)   CMP Latest Ref Rng & Units 10/16/2019  10/15/2019 07/07/2019  Glucose 70 - 99 mg/dL 304(H) 97 202(H)  BUN 6 - 20 mg/dL 15 12 15   Creatinine 0.44 - 1.00 mg/dL 1.81(H) 1.46(H) 1.26(H)  Sodium 135 - 145 mmol/L 134(L) 135 133(L)  Potassium 3.5 - 5.1 mmol/L 4.9 4.7 3.9  Chloride 98 - 111 mmol/L 105 106 104  CO2 22 - 32 mmol/L 19(L) 22 22  Calcium 8.9 - 10.3 mg/dL 8.0(L) 8.1(L) 8.2(L)  Total Protein 6.5 - 8.1 g/dL 4.9(L) 5.1(L) -  Total Bilirubin 0.3 - 1.2 mg/dL 0.6 0.8 -  Alkaline Phos 38 - 126 U/L 63 69 -  AST 15 - 41 U/L 18 24 -  ALT 0 - 44 U/L 15 14 -    IUP @ 29 6/7 weeks H/o Gestational HTN now with preeclampsia with severe features due to visual changes.  BP well controlled with Procardia XL 60 mg.  Platelets are trending down but are not in the severe range, LFTs normal.  Continue ASA 81 mg.  Awaiting MFM consult.  Type I DM-previously controlled with NPH and Novolog             Anticipated increase to BMZ administration.  Increase NPH to 25 units BID and Novolog 15 units AC meals.  CBGs fasting 2 hour postprandial.  Sliding scale insulin, 0-16 units              EKG to assess cardiac status due to DM, previously recommended.  Appreciate Diabetic Coordinator recommendations.  Nephropathy, Stage 2  Creatinine trended up slightly.             F/u 24 hour protein and creatinine clearance.             Keep BP less than 140/90.  Fetal well being  Category I tracing.  S/p BMZ #1  F/u OB ultrasound completed last night.Marland Kitchen  NICU Consult within 12 hours.  D/w Dr. Barbaraann Rondo.   Contractions-asymptomatic.  Decreased s/p IVF Bolus x 2  Check wet prep and GC/Chl.           Anemia- S/p 2 units PRBCs on 10/13/19.             Iron daily             Consider erythropoietin.    Anxiety             Continue Lexapro 10 mg q pm.  H/o HSV   Valtrex daily due to possible vaginal delivery in the near future.  [name] 10/16/2019, 10:40 AM

## 2019-10-16 NOTE — Consult Note (Signed)
[place] 10/16/2019    11:55 AM  Neonatal Medicine Consultation         Kathy Lewis          MRN:  [id number]  I was called at the request of the patient's obstetrician (Dr. Simona Huh) to speak to this patient due to potential premature delivery at 29+ weeks.  The patient's prenatal course includes type 1 diabetes mellitus (on insulin), gestational hypertension, stage 2 nephropathy, anemia (recently transfused 2 units for a hemoglobin level of 6.4 g/dl), Rh negative (has received rhogam), anxiety (treated with Lexapro).  She is 29 6/7 weeks currently.  She is admitted to [facility], and is receiving treatment that includes betamethasone course (started today), insulin, Lexapro, Procardia, Valtrex.  The baby is a female.  I reviewed expectations for a baby born at 23+ weeks, including survival, length of stay, morbidities such as respiratory distress, IVH, infection, feeding intolerance, retinopathy.  I described how we provide respiratory and feeding support.  I discussed breastfeeding.  We would expect her baby to survive with a good outcome, but would most likely need outpatient developmental follow-up.  I spent 20 minutes reviewing the record, speaking to the patient, and entering appropriate documentation.  More than 50% of the time was spent face to face with patient.   _____________________ Roosevelt Locks, MD Attending Neonatologist

## 2019-10-16 NOTE — Progress Notes (Signed)
Spoke with pt remotely.  She states she wants to go home so she can continue to work.  She currently works from home with [employer] and needs a secure network.  She has not been working there for a year and therefore will not get FMLA.  If she does not work, she will not be able to pay rent.  She can not stay with FOB b/c he has a roommate, her parents live 3 hours away.    I d/w pt her diagnosis and its severity.  I strongly recommend she stay in the hospital for daily monitoring.  In fact, if platelets continue on the same trend, she may need to be delivered within 5 days.  I suggested pt speak to her parents and friends to help her through this time.  Asked RN to print out information on HELLP syndrome and Preeclampsia in discharge instructions.  Social work consult ordered.  NST reassuring, contractions minimal. CBG (last 3)  Recent Labs    10/15/19 2222 10/16/19 1046 10/16/19 1501  GLUCAP 258* 227* 124*    Reviewed MFM recommendations.  Discontinue continuous monitoring. NST q shift, BPP once weekly (ordered for Mon 10/20/19).  Daily CBC and CMP ordered.

## 2019-10-17 LAB — COMPREHENSIVE METABOLIC PANEL
ALT: 13 U/L (ref 0–44)
AST: 19 U/L (ref 15–41)
Albumin: 1.5 g/dL — ABNORMAL LOW (ref 3.5–5.0)
Alkaline Phosphatase: 61 U/L (ref 38–126)
Anion gap: 7 (ref 5–15)
BUN: 20 mg/dL (ref 6–20)
CO2: 21 mmol/L — ABNORMAL LOW (ref 22–32)
Calcium: 8 mg/dL — ABNORMAL LOW (ref 8.9–10.3)
Chloride: 108 mmol/L (ref 98–111)
Creatinine, Ser: 1.83 mg/dL — ABNORMAL HIGH (ref 0.44–1.00)
GFR calc Af Amer: 44 mL/min — ABNORMAL LOW (ref >60)
GFR calc non Af Amer: 38 mL/min — ABNORMAL LOW (ref >60)
Glucose, Bld: 141 mg/dL — ABNORMAL HIGH (ref 70–99)
Potassium: 4.6 mmol/L (ref 3.5–5.1)
Sodium: 136 mmol/L (ref 135–145)
Total Bilirubin: 0.3 mg/dL (ref 0.3–1.2)
Total Protein: 4.4 g/dL — ABNORMAL LOW (ref 6.5–8.1)

## 2019-10-17 LAB — CBC
HCT: 23 % — ABNORMAL LOW (ref 36.0–46.0)
Hemoglobin: 7.8 g/dL — ABNORMAL LOW (ref 12.0–15.0)
MCH: 31.2 pg (ref 26.0–34.0)
MCHC: 33.9 g/dL (ref 30.0–36.0)
MCV: 92 fL (ref 80.0–100.0)
Platelets: 124 10*3/uL — ABNORMAL LOW (ref 150–400)
RBC: 2.5 MIL/uL — ABNORMAL LOW (ref 3.87–5.11)
RDW: 14.7 % (ref 11.5–15.5)
WBC: 8.8 10*3/uL (ref 4.0–10.5)
nRBC: 0 % (ref 0.0–0.2)

## 2019-10-17 LAB — GLUCOSE, CAPILLARY
Glucose-Capillary: 103 mg/dL — ABNORMAL HIGH (ref 70–99)
Glucose-Capillary: 114 mg/dL — ABNORMAL HIGH (ref 70–99)
Glucose-Capillary: 124 mg/dL — ABNORMAL HIGH (ref 70–99)
Glucose-Capillary: 143 mg/dL — ABNORMAL HIGH (ref 70–99)
Glucose-Capillary: 44 mg/dL — CL (ref 70–99)
Glucose-Capillary: 52 mg/dL — ABNORMAL LOW (ref 70–99)
Glucose-Capillary: 53 mg/dL — ABNORMAL LOW (ref 70–99)
Glucose-Capillary: 93 mg/dL (ref 70–99)
Glucose-Capillary: 94 mg/dL (ref 70–99)

## 2019-10-17 LAB — GC/CHLAMYDIA PROBE AMP (~~LOC~~) NOT AT ARMC
Chlamydia: NEGATIVE
Comment: NEGATIVE
Comment: NORMAL
Neisseria Gonorrhea: NEGATIVE

## 2019-10-17 MED ORDER — INSULIN NPH (HUMAN) (ISOPHANE) 100 UNIT/ML ~~LOC~~ SUSP
20.0000 [IU] | Freq: Once | SUBCUTANEOUS | Status: AC
  Administered 2019-10-17: 20 [IU] via SUBCUTANEOUS

## 2019-10-17 MED ORDER — DEXTROSE 50 % IV SOLN
INTRAVENOUS | Status: AC
  Administered 2019-10-17: 50 mL
  Filled 2019-10-17: qty 50

## 2019-10-17 MED ORDER — METRONIDAZOLE 500 MG PO TABS
500.0000 mg | ORAL_TABLET | Freq: Two times a day (BID) | ORAL | Status: AC
  Administered 2019-10-17 – 2019-10-23 (×12): 500 mg via ORAL
  Filled 2019-10-17 (×15): qty 1

## 2019-10-17 MED ORDER — INSULIN ASPART 100 UNIT/ML ~~LOC~~ SOLN
12.0000 [IU] | Freq: Once | SUBCUTANEOUS | Status: AC
  Administered 2019-10-17: 12 [IU] via SUBCUTANEOUS

## 2019-10-17 MED ORDER — INSULIN NPH (HUMAN) (ISOPHANE) 100 UNIT/ML ~~LOC~~ SUSP
20.0000 [IU] | Freq: Two times a day (BID) | SUBCUTANEOUS | Status: DC
  Administered 2019-10-17: 20 [IU] via SUBCUTANEOUS

## 2019-10-17 MED ORDER — INSULIN ASPART 100 UNIT/ML ~~LOC~~ SOLN
12.0000 [IU] | Freq: Three times a day (TID) | SUBCUTANEOUS | Status: DC
  Administered 2019-10-17 – 2019-10-18 (×2): 12 [IU] via SUBCUTANEOUS

## 2019-10-17 MED ORDER — INSULIN ASPART 100 UNIT/ML ~~LOC~~ SOLN
0.0000 [IU] | Freq: Three times a day (TID) | SUBCUTANEOUS | Status: DC
  Administered 2019-10-17 – 2019-10-18 (×3): 1 [IU] via SUBCUTANEOUS
  Administered 2019-10-18: 3 [IU] via SUBCUTANEOUS
  Administered 2019-10-19 (×2): 2 [IU] via SUBCUTANEOUS
  Administered 2019-10-20: 1 [IU] via SUBCUTANEOUS

## 2019-10-17 NOTE — Progress Notes (Addendum)
Kathy Lewis is a 24 y.o. G2P0010 at [redacted]w[redacted]d  HD #3   Subjective: Pt without complaints.  Pt denies headaches, upper abdominal pain.  She still see black spots.  Denies symptoms of hypo or hyperglycemia.  Pt has talked with her mother, who stated she would help her so pt now feels comfortable staying in the hospital.  Her parents would like to see her and she requests to go outside to see them. Fetus still remains active.  Objective: BP 139/83 (BP Location: Left Arm)   Pulse 96   Temp 98 F (36.7 C) (Oral)   Resp 18   Ht 5' (1.524 m)   Wt 81.4 kg   LMP 03/14/2019 (Exact Date)   SpO2 100%   BMI 35.06 kg/m  I/O last 3 completed shifts: In: 1580.5 [P.O.:1080; IV Piggyback:500.5] Out: 1030 [Urine:1030] No intake/output data recorded.  Gen:  NAD, appears well.  Questionable periorbital edema Abd:  No upper abdominal pain Ext:  No calf tenderness, swelling bilaterally Neuro:  DTRs not illicited.  FHT:  FHR: 130s bpm, variability: moderate,  accelerations:  Present,  decelerations:  Absent  UC:   ctxn x 1 in the last NST.   Wet Prep:  +clue, Many WBCs, Neg trich and yeast.  24 hour protein- 4303 (5021- 04/29/2020)  Creatinine clearance- 46 (76-09/09/2019)  CBG (last 3)  Recent Labs    10/16/19 1501 10/16/19 2053 10/17/19 0017  GLUCAP 124* 91 143*    CBC Latest Ref Rng & Units 10/17/2019 10/16/2019 10/15/2019  WBC 4.0 - 10.5 K/uL 8.8 8.4 7.2  Hemoglobin 12.0 - 15.0 g/dL 7.8(L) 8.4(L) 8.9(L)  Hematocrit 36.0 - 46.0 % 23.0(L) 25.2(L) 26.1(L)  Platelets 150 - 400 K/uL 124(L) 123(L) 133(L)   CMP Latest Ref Rng & Units 10/17/2019 10/16/2019 10/15/2019  Glucose 70 - 99 mg/dL 141(H) 304(H) 97  BUN 6 - 20 mg/dL 20 15 12   Creatinine 0.44 - 1.00 mg/dL 1.83(H) 1.81(H) 1.46(H)  Sodium 135 - 145 mmol/L 136 134(L) 135  Potassium 3.5 - 5.1 mmol/L 4.6 4.9 4.7  Chloride 98 - 111 mmol/L 108 105 106  CO2 22 - 32 mmol/L 21(L) 19(L) 22  Calcium 8.9 - 10.3 mg/dL 8.0(L) 8.0(L) 8.1(L)  Total  Protein 6.5 - 8.1 g/dL 4.4(L) 4.9(L) 5.1(L)  Total Bilirubin 0.3 - 1.2 mg/dL 0.3 0.6 0.8  Alkaline Phos 38 - 126 U/L 61 63 69  AST 15 - 41 U/L 19 18 24   ALT 0 - 44 U/L 13 15 14    IUP @ 30 0/7 weeks  H/o Gestational HTN now with preeclampsia with severe features due to visual changes.  Possible atypical HELLP per MFM.  Platelets and Creatinine stable.  BP well controlled with Procardia XL 60 mg.  Continue ASA 81 mg.  Repeat CBC and CMP in am to confirm stability.  Deliver at 34 weeks if pt remains stable.  Type I DM-previously controlled with NPH and Novolog outpatient             Anticipated increase to BMZ administration and Insulin was increased.  Decrease NPH to 20 units BID and Novolog 12 units AC meals to avoid hypoglycemia now that   pt has completed steroids.  CBGs fasting, 2 hour postprandial.             Sliding scale insulin, 0-14 units              EKG to assess cardiac status due to DM, previously recommended.  Appreciate Diabetic Coordinator recommendations.  D/w [facility] Curb this morning.  Nephropathy,  CKD Stage 2   Creatinine slightly above baseline but unchanged from yesterday.             24 hour protein is actually the same as in the first trimester but the Creatnine clearance has   declined from the first trimester.   Consult Nephrology for evaluation due to concerns regarding renal function long term if pt   pregnancy continued until 34 weeks.              Keep BP less than 140/90.  Fetal well being- Category I tracing.  Normal growth.  S/p BMZ #2  S/p NICU consult.  NST q shift.   BPP once weekly.  Scheduled for Monday, 10/20/19.  GBS PCR ordered.   Contractions-Now infrequent.  +BV.  Start Flagyl 500 mg po BID x 7 days.  F/u GC/Chl.   Anemia- S/p 2 units PRBCs on 10/13/19.             Iron daily             Consider erythropoietin.    Consult Nephrology.  Anxiety- Doing well emotionally.               Continue Lexapro 10 mg q pm.  H/o HSV    Valtrex daily due to possible vaginal delivery in the near future.  [name] 10/17/2019, 8:33 AM

## 2019-10-17 NOTE — Progress Notes (Addendum)
Hypoglycemic Event  CBG: 41, 44 after recheck   Treatment: 8 oz juice @ 1440  8 oz juice given @ 1500 25gm D50 given per Dr. Landry Mellow  Symptoms: none  Follow-up CBG: Time: 1453 CBG Result: 52 CBG 124 @1624 , Dr Landry Mellow notified. No new orders.    Possible Reasons for Event:    Comments/MD notified: Pt ate off unit at 1230, did not notify RN. 12u Novolog held. This is the 2 hour PP check.   Dr. Landry Mellow called notified [place]

## 2019-10-17 NOTE — Clinical SW OB High Risk (Signed)
Clinical Social Work Antenatal   Clinical Social Worker:  Dimple Nanas, LCSW Date/Time:  10/17/2019, 10:36 AM Gestational Age on Admission:  24 y.o. Admitting Diagnosis: H/o Gestational HTN now with preeclampsia with severe features due to visual changes.   Expected Delivery Date:  12/26/19  Family/Home Environment  Home Address:  [address] [place], [place] [address]  Household Member/Support Name:  MOB reports that she lives alone.   Relationship:  Sister, Significant Other, Mother, Father(MOB was able to identify supports that are local.  MOB's parents are 3 hours away however she identifies them as a source of support.) Other Support:  MOB's sister resides in [place] and is identified as a support.  Psychosocial Data  Information Source:  Patient Interview Resources: MOB has limited resources for supports.  CSW provided MOB with a packet of supports that can assist with food, clothing, utilities, rent, and other needs.  MOB is aware that she can contact CSW if additional information is needed or if MOB has any questions or concerns.    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  Medicaid [facility]): N/A (MOB has private insurance Psychologist, counselling). School:    Current Grade: MOB has complete some college however is not currently enrolled in school.   Homebound Arranged: No  Other Resources:  Medicaid(CSW provided MOB with information to apply for [facility].  MOB agreed to call today and schedule and appointment.)  Cultural/Environment Issues Impacting Care:  None reported   Strengths/Weaknesses/Factors to Consider  Concerns Related to Hospitalization:  MOB expressed concerns regarding financial hardship.  Per MOB, she has only been on her job for 2 months and is not eligible for FMLA or short term disability. MOB is concerned about her monthly expenses (Resources were provided by CSW.     Previous Pregnancies/Feelings  Towards Pregnancy?  Concerns related to being/becoming a mother?:  N/A  Social Support (FOB? Who is/will be helping with baby/other kids?): MOB reports that FOB is involved and will be a support to MOB and child.  FOB is  Anette Guarneri [phone].  Couples Relationship (describe): Per MOB, she and FOB are in a relationship with one another and plans to be great parents.    Recent Stressful Life Events (life changes in past year?):  None reported   Prenatal Care/Education/Home Preparations: CSW provided education regarding essential items needed for infant, SIDS, and PMADs.   Domestic Violence (of any type):  No If Yes to Domestic Violence, Describe/Action Plan:  N/A   Substance Use During Pregnancy: No (If Yes, Complete SBIRT)  Complete PHQ-9 (Depression Screening) on all Antenatal Patients PHQ-9 Score (If Score => 15 complete TREAT):     Follow-up Recommendations:  MOB agreed to review resources list and make contact with agency that can assist MOB with getting her needs met.    Patient Advised/Response:   MOB appeared enthused and happy about being a new mom. MOB also agreed to follow-up with community resources.    Clinical Assessment/Plan:  CSW met with MOB in room 106 to complete an assessment for prolonged hospital stay and no income. When CSW arrived, MOB was watching TV. CSW explained CSW's role and MOB was receptive to meeting with CSW. MOB was polite, easy to engage, and forthcoming.   CSW asked about MOB's thoughts and feeling regarding pregnancy and her current admission. MOB shared feeling happy about being a new mom however concerned regarding a lack of income due to her  admission. CSW validated and normalized MOB's thoughts and feelings. MOB was receptive to resources that CSW provided and also shared that she has a good support team.   MOB acknowledged a hx of anxiety/depression and communicated that her medication is currently managing her symptoms. CSW offered  resources for outpatient counseling and MOB declined however, agreed to reach out to CSW for referrals if needed. CSW assessed for safety and MOB denied SI, HI, and DV.   Per MOB, MOB has not purchased any essential items for infant however reported having the means and supports to obtain items prior to delivery. MOB is aware to reach out to CSW if MOB is unable to obtain essential items.   CSW will continue to offer resources and supports to [facility] while she remain inpatient.   Laurey Arrow, MSW, LCSW Clinical Social Work [phone]

## 2019-10-17 NOTE — Consult Note (Addendum)
Reason for Consult: pre-eclampsia Referring Physician:  Simona Huh, MD  Kathy Lewis is an 24 y.o. female.  HPI: Kathy Lewis is currently [redacted] weeks gestation and has a PMH significant for type 1 DM (diagnosed at the age of 41), CKD stage 3 with nephrotic range proteinuria, gestational HTN, who was admitted 10/13/19 with severe anemia and required blood transfusion of 2 units PRBC's and was discharged that day only to return 2 days later with vjision changes and a markedly elevated BP (164/90) and worsening lower extremity edema..  She was admitted for preeclampsia and blood pressure control.  We were consulted to evaluate her AKI/CKD stage 3.  The trend in Scr is seen below.  She had been seen by our practice in consultation on 05/01/19 and has been followed in our office by Dr. Carolin Sicks.    Trend in Creatinine: Creatinine, Ser  Date/Time Value Ref Range Status  10/17/2019 05:58 AM 1.83 (H) 0.44 - 1.00 mg/dL Final  10/16/2019 05:01 AM 1.81 (H) 0.44 - 1.00 mg/dL Final  10/15/2019 02:34 PM 1.46 (H) 0.44 - 1.00 mg/dL Final  07/07/2019 12:36 AM 1.26 (H) 0.44 - 1.00 mg/dL Final  06/17/2019 10:28 AM 1.55 (H) 0.44 - 1.00 mg/dL Final  05/01/2019 09:48 AM 1.27 (H) 0.44 - 1.00 mg/dL Final  10/16/2017 04:09 AM 1.15 (H) 0.44 - 1.00 mg/dL Final  10/15/2017 12:15 AM 1.12 (H) 0.44 - 1.00 mg/dL Final  12/15/2015 09:55 PM 0.88 0.44 - 1.00 mg/dL Final  12/15/2015 05:48 PM 0.80 0.44 - 1.00 mg/dL Final  12/23/2014 08:15 AM 0.61 0.50 - 1.10 mg/dL Final  12/22/2014 05:33 AM 0.79 0.50 - 1.10 mg/dL Final  12/21/2014 10:20 PM 0.83 0.50 - 1.10 mg/dL Final  12/21/2014 06:18 PM 0.82 0.50 - 1.10 mg/dL Final  12/21/2014 03:46 PM 0.73 0.50 - 1.10 mg/dL Final  12/21/2014 11:12 AM 0.95 0.50 - 1.10 mg/dL Final  12/21/2014 06:25 AM 0.90 0.50 - 1.10 mg/dL Final  12/21/2014 02:25 AM 0.84 0.50 - 1.10 mg/dL Final  12/20/2014 10:34 PM 0.89 0.50 - 1.10 mg/dL Final  12/20/2014 08:19 PM 0.89 0.50 - 1.10 mg/dL Final  12/20/2014 05:15 PM  0.80 0.50 - 1.10 mg/dL Final  12/20/2014 05:12 AM 0.94 0.50 - 1.10 mg/dL Final  12/19/2014 06:40 AM 0.91 0.50 - 1.10 mg/dL Final  12/19/2014 02:00 AM 0.95 0.50 - 1.10 mg/dL Final  12/18/2014 10:20 PM 1.01 0.50 - 1.10 mg/dL Final  12/18/2014 06:24 PM 1.02 0.50 - 1.10 mg/dL Final  12/18/2014 03:18 PM 1.04 0.50 - 1.10 mg/dL Final  12/18/2014 11:26 AM 1.02 0.50 - 1.10 mg/dL Final  09/09/2014 10:04 AM 0.93 0.50 - 1.10 mg/dL Final  07/16/2014 04:00 PM 0.83 0.50 - 1.10 mg/dL Final    PMH:   Past Medical History:  Diagnosis Date  . Chronic kidney disease (CKD), stage II (mild)   . Diabetes mellitus without complication (HCC)     PSH:   Past Surgical History:  Procedure Laterality Date  . NO PAST SURGERIES      Allergies:  Allergies  Allergen Reactions  . Cantaloupe Extract Allergy Skin Test Itching    Mouth itching      Medications:   Prior to Admission medications   Medication Sig Start Date End Date Taking? Authorizing Provider  acetaminophen (TYLENOL) 500 MG tablet Take 500 mg by mouth every 6 (six) hours as needed for mild pain.   Yes [provider]  aspirin EC 81 MG tablet Take 81 mg by mouth at bedtime.   Yes  [provider]  butalbital-acetaminophen-caffeine (FIORICET) 50-325-40 MG tablet Take 1 tablet by mouth every 4 (four) hours as needed for headache or migraine. 05/02/19  Yes [name], MD  escitalopram (LEXAPRO) 10 MG tablet Take 10 mg by mouth at bedtime.   Yes [provider]  insulin aspart (NOVOLOG) 100 UNIT/ML injection Inject 5-10 Units into the skin 3 (three) times daily with meals. Adjust per carb intake Patient taking differently: Inject 5-15 Units into the skin 3 (three) times daily with meals. Adjust per carb intake 06/17/19  Yes Smith, [place], CNM  insulin NPH Human (NOVOLIN N) 100 UNIT/ML injection Inject 0.15 mLs (15 Units total) into the skin 2 (two) times daily at 8 am and 10 pm. Patient taking differently: Inject 20  Units into the skin 2 (two) times daily at 8 am and 10 pm.  06/17/19  Yes Tamala Julian, [place], CNM  ondansetron (ZOFRAN) 4 MG tablet Take 1 tablet (4 mg total) by mouth every 8 (eight) hours as needed for nausea or vomiting. 06/17/19  Yes Cornish, [place], [place]  Prenatal Vit-Fe Fumarate-FA (PRENATAL MULTIVITAMIN) TABS tablet Take 1 tablet by mouth at bedtime.   Yes [provider]  promethazine (PHENERGAN) 12.5 MG tablet Take 1 tablet (12.5 mg total) by mouth every 6 (six) hours as needed for nausea or vomiting. Patient not taking: Reported on 10/15/2019 05/02/19   [name], MD  sertraline (ZOLOFT) 25 MG tablet Take 1 tablet (25 mg total) by mouth daily. Patient not taking: Reported on 06/03/2019 05/03/19   [name], MD    Inpatient medications: . aspirin  81 mg Oral Daily  . docusate sodium  100 mg Oral Daily  . escitalopram  10 mg Oral Daily  . insulin aspart  0-14 Units Subcutaneous TID PC  . insulin aspart  12 Units Subcutaneous TID WC  . insulin NPH Human  20 Units Subcutaneous BID AC & HS  . iron polysaccharides  150 mg Oral Daily  . metroNIDAZOLE  500 mg Oral Q12H  . NIFEdipine  60 mg Oral Daily  . prenatal multivitamin  1 tablet Oral Q1200  . valACYclovir  500 mg Oral Daily    Discontinued Meds:   Medications Discontinued During This Encounter  Medication Reason  . magnesium bolus via infusion 4 g   . magnesium sulfate 40 grams in SWI 1000 mL OB infusion   . Prenatal Vit-Fe Fumarate-FA (MULTIVITAMIN-PRENATAL) 27-0.8 MG TABS tablet Duplicate  . metroNIDAZOLE (FLAGYL) 500 MG tablet Completed Course  . ASPIRIN 81 PO Entry Error  . lactated ringers bolus 500 mL   . insulin NPH Human (NOVOLIN N) injection 20 Units   . insulin aspart (novoLOG) injection 10-15 Units   . insulin NPH Human (NOVOLIN N) injection 5 Units   . insulin aspart (novoLOG) injection 5 Units   . insulin aspart (novoLOG) injection 0-14 Units   . insulin aspart (novoLOG) injection 0-16 Units   .  insulin NPH Human (NOVOLIN N) injection 25 Units   . insulin aspart (novoLOG) injection 15 Units     Social History:  reports that she has quit smoking. She has never used smokeless tobacco. She reports that she does not drink alcohol or use drugs.  Family History:  History reviewed. No pertinent family history.  Pertinent items are noted in HPI. Weight change: 1.633 kg  Intake/Output Summary (Last 24 hours) at 10/17/2019 1526 Last data filed at 10/17/2019 1513 Gross per 24 hour  Intake 1269 ml  Output 800 ml  Net  469 ml   BP (!) 152/91 (BP Location: Left Arm)   Pulse 100   Temp 98.1 F (36.7 C) (Oral)   Resp 18   Ht 5' (1.524 m)   Wt 81.4 kg   LMP 03/14/2019 (Exact Date)   SpO2 100%   BMI 35.06 kg/m  Vitals:   10/17/19 0500 10/17/19 0909 10/17/19 1140 10/17/19 1503  BP:  137/83 (!) 140/92 (!) 152/91  Pulse:  92 99 100  Resp:   17 18  Temp:  98.4 F (36.9 C) 98.4 F (36.9 C) 98.1 F (36.7 C)  TempSrc:  Oral Oral Oral  SpO2:  100% 100% 100%  Weight: 81.4 kg     Height:         General appearance: alert, cooperative and no distress Head: Normocephalic, without obvious abnormality, atraumatic Resp: clear to auscultation bilaterally Cardio: regular rate and rhythm, S1, S2 normal, no murmur, click, rub or gallop GI: nontender, appropriate for [redacted] weeks gestation Extremities: edema 2+ pitting  Labs: Basic Metabolic Panel: Recent Labs  Lab 10/15/19 1434 10/16/19 0501 10/17/19 0558  NA 135 134* 136  K 4.7 4.9 4.6  CL 106 105 108  CO2 22 19* 21*  GLUCOSE 97 304* 141*  BUN 12 15 20   CREATININE 1.46* 1.81* 1.83*  ALBUMIN 1.7* 1.7* 1.5*  CALCIUM 8.1* 8.0* 8.0*   Liver Function Tests: Recent Labs  Lab 10/15/19 1434 10/16/19 0501 10/17/19 0558  AST 24 18 19   ALT 14 15 13   ALKPHOS 69 63 61  BILITOT 0.8 0.6 0.3  PROT 5.1* 4.9* 4.4*  ALBUMIN 1.7* 1.7* 1.5*   No results for input(s): LIPASE, AMYLASE in the last 168 hours. No results for input(s): AMMONIA in  the last 168 hours. CBC: Recent Labs  Lab 10/13/19 2105 10/15/19 1434 10/16/19 0501 10/17/19 0558  WBC 7.2 7.2 8.4 8.8  HGB 6.5* 8.9* 8.4* 7.8*  HCT 19.6* 26.1* 25.2* 23.0*  MCV 95.6 93.2 92.6 92.0  PLT 142* 133* 123* 124*   PT/INR: @LABRCNTIP (inr:5) Cardiac Enzymes: )No results for input(s): CKTOTAL, CKMB, CKMBINDEX, TROPONINI in the last 168 hours. CBG: Recent Labs  Lab 10/17/19 1149 10/17/19 1433 10/17/19 1434 10/17/19 1453 10/17/19 1514  GLUCAP 103* 41* 44* 52* 53*    Iron Studies: No results for input(s): IRON, TIBC, TRANSFERRIN, FERRITIN in the last 168 hours.  Xrays/Other Studies: [place] MFM OB FOLLOW UP  Result Date: 10/16/2019 ----------------------------------------------------------------------  OBSTETRICS REPORT                       (Signed Final 10/16/2019 02:32 pm) ---------------------------------------------------------------------- Patient Info  ID #:       [id number]                          D.O.B.:  1996-06-15 (23 yrs)  Name:       Kathy Lewis                   Visit Date: 10/15/2019 08:44 pm ---------------------------------------------------------------------- Performed By  Performed By:     Berlinda Last          Ref. Address:      [address]                                                              [place]  Attending:        Sander Nephew      Secondary Phy.:    [facility]                    MD                                                              Care  Referred By:      Estill Bamberg                 [facility]                    Simona Huh MD                                [place] ---------------------------------------------------------------------- Orders   #  Description                          Code         Ordered By   1  [place] MFM OB FOLLOW UP                   [id number]     [name]  ----------------------------------------------------------------------   #  Order #                    Accession #                 Episode #   1  [id number]                  [id number]                  [id number]  ---------------------------------------------------------------------- Indications   Pre-existing diabetes, type 1, in pregnancy,   O24.013   third trimester   [redacted] weeks gestation of pregnancy                Z3A.29   Hypertension - Gestational                     O13.9  ---------------------------------------------------------------------- Fetal Evaluation  Num Of Fetuses:          1  Fetal Heart Rate(bpm):   135  Cardiac Activity:        Observed  Presentation:  Cephalic  Placenta:                Posterior  P. Cord Insertion:       Previously Visualized  Amniotic Fluid  AFI FV:      Subjectively increased  AFI Sum(cm)     %Tile       Largest Pocket(cm)  23.92           96          9.42  RUQ(cm)       RLQ(cm)       LUQ(cm)        LLQ(cm)  9.42          5.07          4.17           5.26 ---------------------------------------------------------------------- Biometry  BPD:      75.6  mm     G. Age:  30w 2d         58  %    CI:        74.32   %    70 - 86                                                          FL/HC:       19.2  %    19.2 - 21.4  HC:      278.4  mm     G. Age:  30w 3d         36  %    HC/AC:       1.07       0.99 - 1.21  AC:      260.5  mm     G. Age:  30w 1d         59  %    FL/BPD:      70.8  %    71 - 87  FL:       53.5  mm     G. Age:  28w 3d          8  %    FL/AC:       20.5  %    20 - 24  Est. FW:    1430   gm     3 lb 2 oz     34  % ---------------------------------------------------------------------- OB History  Gravidity:    2         Term:   0        Prem:   0        SAB:   1  TOP:          0       Ectopic:  0        Living: 0 ---------------------------------------------------------------------- Gestational Age  LMP:           30w 5d         Date:  03/14/19                 EDD:   12/19/19  U/S Today:     29w 6d  EDD:   12/25/19  Best:          29w 5d     Det. ByLoman Chroman         EDD:   12/26/19                                      (04/30/19) ---------------------------------------------------------------------- Anatomy  Cranium:               Appears normal         LVOT:                   Previously seen  Cavum:                 Previously seen        Aortic Arch:            Appears normal  Ventricles:            Previously seen        Ductal Arch:            Appears normal  Choroid Plexus:        Previously seen        Diaphragm:              Appears normal  Cerebellum:            Previously seen        Stomach:                Previously Seen  Posterior Fossa:       Previously seen        Abdomen:                Previously seen  Nuchal Fold:           Previously seen        Abdominal Wall:         Previously seen  Face:                  Orbits and profile     Cord Vessels:           Previously seen                         previously seen  Lips:                  Previously seen        Kidneys:                Appear normal  Palate:                Not well visualized    Bladder:                Appears normal  Thoracic:              Appears normal         Spine:                  Previously seen  Heart:                 Appears normal         Upper Extremities:      Previously seen                         (  4CH, axis, and                         situs)  RVOT:                  Previously seen        Lower Extremities:      Previously seen  Other:  Female gender Heels and 5th digit visualized previously. Nasal bone          visualized previously. Technically difficult due to fetal position. ---------------------------------------------------------------------- Cervix Uterus Adnexa  Cervix  Not visualized (advanced GA >24wks) ---------------------------------------------------------------------- Impression   Normal interval growth  Inpatient management of new onset hypertension  Known CKD and Type 1 DM  Good fetal movement with subjectively increased amniotic  fluid volume but objectively normal. ---------------------------------------------------------------------- Recommendations  Continue weekly testing  Repeat growth in 3 weeks.  Consult performed see notes for details. ----------------------------------------------------------------------               Sander Nephew, MD Electronically Signed Final Report   10/16/2019 02:32 pm ----------------------------------------------------------------------    Assessment/Plan: 1. Preeclampsia- has nephrotic range proteinuria and worsening HTN.  Currently on procardia xl 60 mg with improvement.  We may need to add methyl-dopa or labetalol if her BP starts to climb again.  Hold off on diuretics for now. 1. Also start sodium restriction ( was eating bacon/chicken flatbread pizza) 2. AKI/CKD stage 3- slight bump in Scr likely due to combination of preeclampsia as well as significant anemia.  Will continue to follow UOP and Scr. 3. Type 1 DM- poorly controlled prior to pregnancy with improvement 4. CKD stage 3 - due to diabetic nephropathy.  Stressed importance of diabetic control with goal Hgb A1c <7%, BP control with goal <130/80, use of an ACE or ARB, however unable to use during pregnancy.  Also discussed the need to avoid NSAIDs/Cox-II I"s.  5. Anemia- s/p blood transfusion.  Will check iron stores but may require ESA although it will take weeks to take effect.    Governor Rooks Iyania Denne 10/17/2019, 3:26 PM

## 2019-10-17 NOTE — Progress Notes (Signed)
Inpatient Diabetes Program Recommendations  Diabetes Treatment Program Recommendations  ADA Standards of Care 2018 Diabetes in Pregnancy Target Glucose Ranges:  Fasting: 60 - 90 mg/dL Preprandial: 60 - 105 mg/dL 1 hr postprandial: Less than 140mg /dL (from first bite of meal) 2 hr postprandial: Less than 120 mg/dL (from first bite of meal)     Lab Results  Component Value Date   GLUCAP 143 (H) 10/17/2019   HGBA1C 12.1 (H) 12/18/2014    Review of Glycemic Control Results for Kathy Lewis, Kathy Lewis (MRN [id number]) as of 10/17/2019 08:27  Ref. Range 10/16/2019 15:01 10/16/2019 20:53 10/17/2019 00:17  Glucose-Capillary Latest Ref Range: 70 - 99 mg/dL 124 (H) 91 143 (H)   Diabetes history: Type 1 DM Outpatient Diabetes medications: NPH 15 units BID, Novolog 5-15 units TID Current orders for Inpatient glycemic control: NPH 25 units BID, Novolog 15 units TID, Novolog 0-14 units TID BMZ x 2  Inpatient Diabetes Program Recommendations  Anticipate insulin needs will begin to decrease over next 12 hours.   Consider decreasing Novolog to 12 units TID (assuming patient is consuming >50% of meal), and NPH to 20 units BID.  Discussed with MD.  Thanks, Bronson Curb, MSN, RNC-OB Diabetes Coordinator [phone] (8a-5p)

## 2019-10-17 NOTE — Progress Notes (Signed)
I introduced spiritual care services to pt.  She stated that for the time being she is doing okay.  She was very grateful to see her parents today.  We will come back to check in on her when we are able, but please page as needs arise.  [place], [place] Pager, [phone] 2:46 PM

## 2019-10-18 LAB — COMPREHENSIVE METABOLIC PANEL
ALT: 14 U/L (ref 0–44)
AST: 19 U/L (ref 15–41)
Albumin: 1.5 g/dL — ABNORMAL LOW (ref 3.5–5.0)
Alkaline Phosphatase: 59 U/L (ref 38–126)
Anion gap: 7 (ref 5–15)
BUN: 17 mg/dL (ref 6–20)
CO2: 21 mmol/L — ABNORMAL LOW (ref 22–32)
Calcium: 7.9 mg/dL — ABNORMAL LOW (ref 8.9–10.3)
Chloride: 109 mmol/L (ref 98–111)
Creatinine, Ser: 1.83 mg/dL — ABNORMAL HIGH (ref 0.44–1.00)
GFR calc Af Amer: 44 mL/min — ABNORMAL LOW (ref >60)
GFR calc non Af Amer: 38 mL/min — ABNORMAL LOW (ref >60)
Glucose, Bld: 73 mg/dL (ref 70–99)
Potassium: 4.1 mmol/L (ref 3.5–5.1)
Sodium: 137 mmol/L (ref 135–145)
Total Bilirubin: 0.5 mg/dL (ref 0.3–1.2)
Total Protein: 4.6 g/dL — ABNORMAL LOW (ref 6.5–8.1)

## 2019-10-18 LAB — URINALYSIS, COMPLETE (UACMP) WITH MICROSCOPIC
Bacteria, UA: NONE SEEN
Bilirubin Urine: NEGATIVE
Glucose, UA: NEGATIVE mg/dL
Hgb urine dipstick: NEGATIVE
Ketones, ur: NEGATIVE mg/dL
Nitrite: NEGATIVE
Protein, ur: 100 mg/dL — AB
Specific Gravity, Urine: 1.012 (ref 1.005–1.030)
pH: 7 (ref 5.0–8.0)

## 2019-10-18 LAB — IRON AND TIBC
Iron: 73 ug/dL (ref 28–170)
Saturation Ratios: 24 % (ref 10.4–31.8)
TIBC: 301 ug/dL (ref 250–450)
UIBC: 228 ug/dL

## 2019-10-18 LAB — FERRITIN: Ferritin: 134 ng/mL (ref 11–307)

## 2019-10-18 LAB — GLUCOSE, CAPILLARY
Glucose-Capillary: 114 mg/dL — ABNORMAL HIGH (ref 70–99)
Glucose-Capillary: 166 mg/dL — ABNORMAL HIGH (ref 70–99)
Glucose-Capillary: 29 mg/dL — CL (ref 70–99)
Glucose-Capillary: 43 mg/dL — CL (ref 70–99)
Glucose-Capillary: 46 mg/dL — ABNORMAL LOW (ref 70–99)
Glucose-Capillary: 52 mg/dL — ABNORMAL LOW (ref 70–99)
Glucose-Capillary: 58 mg/dL — ABNORMAL LOW (ref 70–99)
Glucose-Capillary: 61 mg/dL — ABNORMAL LOW (ref 70–99)
Glucose-Capillary: 72 mg/dL (ref 70–99)
Glucose-Capillary: 75 mg/dL (ref 70–99)
Glucose-Capillary: 90 mg/dL (ref 70–99)

## 2019-10-18 LAB — CBC
HCT: 23.3 % — ABNORMAL LOW (ref 36.0–46.0)
Hemoglobin: 7.8 g/dL — ABNORMAL LOW (ref 12.0–15.0)
MCH: 31.6 pg (ref 26.0–34.0)
MCHC: 33.5 g/dL (ref 30.0–36.0)
MCV: 94.3 fL (ref 80.0–100.0)
Platelets: 125 10*3/uL — ABNORMAL LOW (ref 150–400)
RBC: 2.47 MIL/uL — ABNORMAL LOW (ref 3.87–5.11)
RDW: 14.7 % (ref 11.5–15.5)
WBC: 8.5 10*3/uL (ref 4.0–10.5)
nRBC: 0 % (ref 0.0–0.2)

## 2019-10-18 LAB — PROTEIN / CREATININE RATIO, URINE
Creatinine, Urine: 76.88 mg/dL
Protein Creatinine Ratio: 3.56 mg/mg{Cre} — ABNORMAL HIGH (ref 0.00–0.15)
Total Protein, Urine: 274 mg/dL

## 2019-10-18 LAB — VITAMIN B12: Vitamin B-12: 349 pg/mL (ref 180–914)

## 2019-10-18 LAB — GROUP B STREP BY PCR: Group B strep by PCR: NEGATIVE

## 2019-10-18 LAB — SODIUM, URINE, RANDOM: Sodium, Ur: 100 mmol/L

## 2019-10-18 LAB — CREATININE, URINE, RANDOM: Creatinine, Urine: 76.02 mg/dL

## 2019-10-18 MED ORDER — METOCLOPRAMIDE HCL 10 MG PO TABS
10.0000 mg | ORAL_TABLET | Freq: Three times a day (TID) | ORAL | Status: DC
  Administered 2019-10-18 – 2019-10-19 (×2): 10 mg via ORAL
  Filled 2019-10-18 (×2): qty 1

## 2019-10-18 MED ORDER — INSULIN ASPART 100 UNIT/ML ~~LOC~~ SOLN
10.0000 [IU] | Freq: Three times a day (TID) | SUBCUTANEOUS | Status: DC
  Administered 2019-10-19 (×2): 10 [IU] via SUBCUTANEOUS

## 2019-10-18 MED ORDER — INSULIN NPH (HUMAN) (ISOPHANE) 100 UNIT/ML ~~LOC~~ SUSP
16.0000 [IU] | Freq: Two times a day (BID) | SUBCUTANEOUS | Status: DC
  Administered 2019-10-18: 16 [IU] via SUBCUTANEOUS

## 2019-10-18 MED ORDER — INSULIN NPH (HUMAN) (ISOPHANE) 100 UNIT/ML ~~LOC~~ SUSP
13.0000 [IU] | Freq: Two times a day (BID) | SUBCUTANEOUS | Status: DC
  Administered 2019-10-18 – 2019-10-19 (×2): 13 [IU] via SUBCUTANEOUS

## 2019-10-18 NOTE — Progress Notes (Signed)
Hypoglycemic Event  CBG: 29  Treatment: 8 oz juice/soda  Symptoms: Shaky and tired  Follow-up CBG: Time:1234 CBG Result: 52  Possible Reasons for Event: Inadequate meal intake  Comments/MD notified:Daniela Paul CNM   CBG: 52  Treatment: 8 oz juice/soda  Symptoms: Tired and hungry  Follow-up CBG: Time:  1250       CBG Result: 72  Possible Reasons for Event: Inadequate meal intake  Comments/MD notified:Daniela [facility]

## 2019-10-18 NOTE — Progress Notes (Addendum)
Patient ID: Kathy Lewis, female   DOB: March 10, 1996, 24 y.o.   MRN: [id number] [id number] at [redacted]w[redacted]d  HD #4   S: Resting in bed, denies HA/NV/RUQ pain, still has vision changes - black spots in field of vision, constant.  Episode of hypoglycemia during the night resolved w/ snack, feels better this morning, no further shakes or sweating. No LOF/VB, has not felt baby move this AM.      O:  Vitals:   10/18/19 0450 10/18/19 0828  BP: 139/84 (!) 136/93  Pulse: 97 93  Resp: 18 18  Temp: 98.1 F (36.7 C) 98.4 F (36.9 C)  SpO2: 100% 100%   BP (!) 136/93 (BP Location: Left Arm)   Pulse 93   Temp 98.4 F (36.9 C) (Oral)   Resp 18   Ht 5' (1.524 m)   Wt 83.9 kg   LMP 03/14/2019 (Exact Date)   SpO2 100%   BMI 36.13 kg/m     Intake/Output Summary (Last 24 hours) at 10/18/2019 0847 Last data filed at 10/18/2019 [id number] Gross per 24 hour  Intake 1839 ml  Output 1000 ml  Net 839 ml   Filed Weights   10/16/19 0609 10/17/19 0500 10/18/19 0400  Weight: 81.7 kg 81.4 kg 83.9 kg   CBC Latest Ref Rng & Units 10/18/2019 10/17/2019 10/16/2019  WBC 4.0 - 10.5 K/uL 8.5 8.8 8.4  Hemoglobin 12.0 - 15.0 g/dL 7.8(L) 7.8(L) 8.4(L)  Hematocrit 36.0 - 46.0 % 23.3(L) 23.0(L) 25.2(L)  Platelets 150 - 400 K/uL 125(L) 124(L) 123(L)   CMP Latest Ref Rng & Units 10/18/2019 10/17/2019 10/16/2019  Glucose 70 - 99 mg/dL 73 141(H) 304(H)  BUN 6 - 20 mg/dL 17 20 15   Creatinine 0.44 - 1.00 mg/dL 1.83(H) 1.83(H) 1.81(H)  Sodium 135 - 145 mmol/L 137 136 134(L)  Potassium 3.5 - 5.1 mmol/L 4.1 4.6 4.9  Chloride 98 - 111 mmol/L 109 108 105  CO2 22 - 32 mmol/L 21(L) 21(L) 19(L)  Calcium 8.9 - 10.3 mg/dL 7.9(L) 8.0(L) 8.0(L)  Total Protein 6.5 - 8.1 g/dL 4.6(L) 4.4(L) 4.9(L)  Total Bilirubin 0.3 - 1.2 mg/dL 0.5 0.3 0.6  Alkaline Phos 38 - 126 U/L 59 61 63  AST 15 - 41 U/L 19 19 18   ALT 0 - 44 U/L 14 13 15    CBG (last 3)  Recent Labs    10/18/19 0456 10/18/19 0521 10/18/19 0634  GLUCAP 43* 61* 90      [id number]: 130 bpm, moderate variability, + accels, no decels Toco: none  Gen: AAO x 3, NAD, subdued mood Abd: nontender, gravid GU deferred Ext: trace pedal edema, no calf tenderness   A/P: IUP @ 30 1/7 weeks  H/o Gestational HTN now with preeclampsia with severe features due to visual changes.             Possible atypical HELLP per MFM.             Platelets and Creatinine stable.             BP well controlled with Procardia XL 60 mg.             Continue ASA 81 mg.             Daily CBC and CMP             Deliver at 34 weeks if pt remains stable.  Type I DM-previously controlled with NPH and Novolog outpatient Decreased NPH to 20 units BID and Novolog 12 units AC meals  to avoid hypoglycemia post steroids course, developed hypoglycemia overnight  Will decrease further NPH 16 units BID and may go down to 13 units if hypoglycemic event reoccurs             CBGs fasting, 2 hour postprandial. Sliding scale insulin, 0-14 units  EKG to assess cardiac status due to DM completed 2/19             Appreciate Diabetic Coordinator recommendations.    Nephropathy,  CKD Stage 2              Creatinine slightly above baseline but stable 24 hour protein is actually the same as in the first trimester but the Creatinine clearance has declined from the first trimester.              Nephrology consult completed 2/19with recommendations as follows:  - if BP starts to climb again can add methyl-dopa or labetalol  - hold off diuretics for now  - avoid NSAIDs/Cox-II I's (OK for baby ASA)  - anemia may require ESA although it will take weeks to take effect.              Keep BP less than 140/90.  Fetal well being- Category I tracing.  Normal growth.             S/p BMZ #2             S/p NICU consult.             NST q shift.              BPP once weekly.  Scheduled for Monday, 10/20/19.             GBS PCR ordered/pending.               Contractions-Now infrequent.             +BV.  Started Flagyl 500 mg po BID x 7 days.             F/u GC/Chl.              Anemia- S/p 2 units PRBCs on 10/13/19. Hgb stable 7.8 today Iron daily, consider Feraheme if Hgb trending down Consider erythropoietin.              Consult Nephrology completed.  Anxiety- Doing well emotionally. Visiting w/ parents outside daily x 1 hour.  Chaplin consult yesterday  Continue Lexapro 10 mg q pm.  H/o HSV              Valtrex daily due to possible vaginal delivery in the near future.  POC in consult Dr. Alesia Richards and Dr. Katheran Awe, CNM, MSN 10/18/2019, 8:43 AM  Attestation of Attending Supervision of Advanced Practitioner (CNM/NP): Evaluation and management procedures were performed by the Advanced Practitioner under my supervision and collaboration.  I have reviewed the Advanced Practitioner's note and chart, and I agree with the management and plan.  I saw and examined patient at bedside and agree with above findings, assessment and plan as outlined above by CNM.  I saw patient at about 1300.  We reviewed eating pattern with patient and RN.  This morning patient had only a pear for breakfast with subsequent hypoglycemia of 29.  She received juice and sugars improved to 72.  She had received 16 Units NPH and 12 units novolog before eating her pear. Patient reports the only thing she wants to eat from the hospital  menu is [place] burger which is high in sodium and patient is on sodium restriction per nephrologist so she was not allowed to eat it any more.  RN to help identify other low sodium and diabetic menu options.  Patient had her family bring her hibachi japanese meal for lunch, we discussed this may be high in sodium and encouraged patient to pick food from hospital menu. Discussed with diabetic coordinator, will decrease NPH to 13 units BID and give 10 units novolog tid if she eats more than 50% of her  meal.  Patient requested reglan for gastroparesis symptoms stating she feels bloated and without bowel movement for days, she has used reglan before, ordered it. NST reviewed while in room, category 1 with occasional contractions that patient does not feel. 130 BL, mod variability, reactive.  Dr. Alesia Richards.  10/18/2019.

## 2019-10-18 NOTE — Progress Notes (Signed)
Nutrition  Nutrition Consult acknowledged.  Consults placed after 11 AM on Saturday will be completed on Monday  Pt with prescribed diet of gestational diabetic carbohydrate modified/ 2 g sodium restriction. Reported to not being able to find food options to her liking and is consuming foods of choice brought in from outside. The current diet order significantly limits food options. There are no other menu options other than what is presented on her menu, and many of these are eliminated due to the sodium restriction.  Given that the hospital menu is moderate in sodium, it may be a better option to eliminate the sodium restriction to allow more hospital menu options. This would likely still be lower in sodium than what is brought in from outside.    Pt has had multiple episodes of diet education in the past.  If diet compliance can not be achieved, may need to consider other medical options.   Weyman Rodney M.Fredderick Severance LDN Neonatal Nutrition Support Specialist/RD III

## 2019-10-18 NOTE — Progress Notes (Signed)
@  [phone] patient blood glucose level checked with a result of 43. Patient given 4oz of milk and 4oz of juice to drink between 0500 and 0505. Patient blood glucose level rechecked @0520  with a result of 61. Wilcox notified of hypoglycemia, interventions, and repeat blood glucose value @ 0523.   Orders received: Give patient something to eat  Recheck blood glucose level in one hour Withhold morning insulin including novoLOG w/ meal and NOVOLIN N before breakfast

## 2019-10-18 NOTE — Progress Notes (Addendum)
Inpatient Diabetes Program Recommendations  Diabetes Treatment Program Recommendations  ADA Standards of Care Diabetes in Pregnancy Target Glucose Ranges:  Fasting: 60 - 90 mg/dL Preprandial: 60 - 105 mg/dL 1 hr postprandial: Less than 140mg /dL (from first bite of meal) 2 hr postprandial: Less than 120 mg/dL (from first bite of meal)   Results for Kathy Lewis, Kathy Lewis (MRN [id number]) as of 10/18/2019 07:53  Ref. Range 10/18/2019 00:22 10/18/2019 00:53 10/18/2019 01:24 10/18/2019 04:56 10/18/2019 05:21 10/18/2019 06:34  Glucose-Capillary Latest Ref Range: 70 - 99 mg/dL 46 (L) 58 (L) 75 43 (LL) 61 (L) 90  Results for Kathy Lewis, Kathy Lewis (MRN [id number]) as of 10/18/2019 07:53  Ref. Range 10/17/2019 09:50 10/17/2019 11:49 10/17/2019 14:33 10/17/2019 14:34 10/17/2019 14:53 10/17/2019 15:14 10/17/2019 16:20 10/17/2019 19:30 10/17/2019 21:33  Glucose-Capillary Latest Ref Range: 70 - 99 mg/dL 114 (H) 103 (H) 41 (LL) 44 (LL) 52 (L) 53 (L) 124 (H) 94 93   Review of Glycemic Control  Diabetes history: Type 1 DM Outpatient Diabetes medications: NPH 15 units BID, Novolog 5-15 units TID Current orders for Inpatient glycemic control: NPH 16 units BID, Novolog 12 units TID, Novolog 0-14 units TID  Inpatient Diabetes Program Recommendations:   Insulin-Basal: Noted NPH decreased from 20 to 16 units BID today.  If patient has any other issues with hypoglycemia today, may want to consider decreasing NPH to 13 units BID.   Insulin-Meal Coverage: Please consider decreasing meal coverage to Novolog 10 units TID with meals if patient eats at least 50% of meals. May want to be sure patient is eating at least 50% of meal before giving meal coverage.  Thanks, Barnie Alderman, RN, MSN, CDE Diabetes Coordinator Inpatient Diabetes Program [phone] (Team Pager from 8am to 5pm)

## 2019-10-18 NOTE — Progress Notes (Signed)
At [facility] Patient called out feeling "shakey and like my blood sugar is low." Blood glucose level 46 @0022 . CNM Burman Foster notified @0025  about blood glucose level. RN instructed to give patient a juice and a milk. Patient had 4oz juice, 4 oz milk, and one apple between 0030 and 0040. Blood glucose level rechecked @0053 . BS 58. CNM Burman Foster notified of repeat blood glucose @0055 . RN instructed to recheck blood glucose level in 30 minutes. Blood glucose level 75 @0124 .

## 2019-10-18 NOTE — Progress Notes (Addendum)
Pt eating food brought from home. Pt states it is hibachi. Pt educated on sodium restriction and carb mod diet. Pt aware but states that "I cannot have anything that I want here. There is too much restriction." Pt explained importance of strict diet in order to control blood sugars and sodium restriction to manage blood pressures. Pt still chose to eat her food brought from home. Dr. Alesia Richards was at bedside and aware.

## 2019-10-19 LAB — COMPREHENSIVE METABOLIC PANEL
ALT: 15 U/L (ref 0–44)
ALT: 14 U/L (ref 0–44)
AST: 19 U/L (ref 15–41)
AST: 21 U/L (ref 15–41)
Albumin: 1.4 g/dL — ABNORMAL LOW (ref 3.5–5.0)
Albumin: 1.6 g/dL — ABNORMAL LOW (ref 3.5–5.0)
Alkaline Phosphatase: 59 U/L (ref 38–126)
Alkaline Phosphatase: 62 U/L (ref 38–126)
Anion gap: 6 (ref 5–15)
Anion gap: 6 (ref 5–15)
BUN: 20 mg/dL (ref 6–20)
BUN: 20 mg/dL (ref 6–20)
CO2: 22 mmol/L (ref 22–32)
CO2: 23 mmol/L (ref 22–32)
Calcium: 7.7 mg/dL — ABNORMAL LOW (ref 8.9–10.3)
Calcium: 7.9 mg/dL — ABNORMAL LOW (ref 8.9–10.3)
Chloride: 108 mmol/L (ref 98–111)
Chloride: 109 mmol/L (ref 98–111)
Creatinine, Ser: 1.55 mg/dL — ABNORMAL HIGH (ref 0.44–1.00)
Creatinine, Ser: 1.58 mg/dL — ABNORMAL HIGH (ref 0.44–1.00)
GFR calc Af Amer: 54 mL/min — ABNORMAL LOW (ref >60)
GFR calc Af Amer: 53 mL/min — ABNORMAL LOW (ref >60)
GFR calc non Af Amer: 47 mL/min — ABNORMAL LOW (ref >60)
GFR calc non Af Amer: 46 mL/min — ABNORMAL LOW (ref >60)
Glucose, Bld: 169 mg/dL — ABNORMAL HIGH (ref 70–99)
Glucose, Bld: 50 mg/dL — ABNORMAL LOW (ref 70–99)
Potassium: 4.6 mmol/L (ref 3.5–5.1)
Potassium: 4.1 mmol/L (ref 3.5–5.1)
Sodium: 136 mmol/L (ref 135–145)
Sodium: 138 mmol/L (ref 135–145)
Total Bilirubin: 0.4 mg/dL (ref 0.3–1.2)
Total Bilirubin: 0.7 mg/dL (ref 0.3–1.2)
Total Protein: 4.4 g/dL — ABNORMAL LOW (ref 6.5–8.1)
Total Protein: 4.7 g/dL — ABNORMAL LOW (ref 6.5–8.1)

## 2019-10-19 LAB — CBC
HCT: 22.3 % — ABNORMAL LOW (ref 36.0–46.0)
Hemoglobin: 7.6 g/dL — ABNORMAL LOW (ref 12.0–15.0)
MCH: 31.5 pg (ref 26.0–34.0)
MCHC: 34.1 g/dL (ref 30.0–36.0)
MCV: 92.5 fL (ref 80.0–100.0)
Platelets: 117 10*3/uL — ABNORMAL LOW (ref 150–400)
RBC: 2.41 MIL/uL — ABNORMAL LOW (ref 3.87–5.11)
RDW: 14.5 % (ref 11.5–15.5)
WBC: 7.9 10*3/uL (ref 4.0–10.5)
nRBC: 0 % (ref 0.0–0.2)

## 2019-10-19 LAB — TYPE AND SCREEN
ABO/RH(D): O NEG
Antibody Screen: POSITIVE
Unit division: 0
Unit division: 0

## 2019-10-19 LAB — GLUCOSE, CAPILLARY
Glucose-Capillary: 46 mg/dL — ABNORMAL LOW (ref 70–99)
Glucose-Capillary: 60 mg/dL — ABNORMAL LOW (ref 70–99)
Glucose-Capillary: 134 mg/dL — ABNORMAL HIGH (ref 70–99)
Glucose-Capillary: 138 mg/dL — ABNORMAL HIGH (ref 70–99)
Glucose-Capillary: 43 mg/dL — CL (ref 70–99)
Glucose-Capillary: 62 mg/dL — ABNORMAL LOW (ref 70–99)
Glucose-Capillary: 79 mg/dL (ref 70–99)
Glucose-Capillary: 158 mg/dL — ABNORMAL HIGH (ref 70–99)
Glucose-Capillary: 154 mg/dL — ABNORMAL HIGH (ref 70–99)
Glucose-Capillary: 93 mg/dL (ref 70–99)

## 2019-10-19 LAB — CBC WITH DIFFERENTIAL/PLATELET
Abs Immature Granulocytes: 0.07 10*3/uL (ref 0.00–0.07)
Basophils Absolute: 0 10*3/uL (ref 0.0–0.1)
Basophils Relative: 0 %
Eosinophils Absolute: 0.1 10*3/uL (ref 0.0–0.5)
Eosinophils Relative: 1 %
HCT: 24.1 % — ABNORMAL LOW (ref 36.0–46.0)
Hemoglobin: 8.2 g/dL — ABNORMAL LOW (ref 12.0–15.0)
Immature Granulocytes: 1 %
Lymphocytes Relative: 24 %
Lymphs Abs: 2.2 10*3/uL (ref 0.7–4.0)
MCH: 31.7 pg (ref 26.0–34.0)
MCHC: 34 g/dL (ref 30.0–36.0)
MCV: 93.1 fL (ref 80.0–100.0)
Monocytes Absolute: 0.6 10*3/uL (ref 0.1–1.0)
Monocytes Relative: 6 %
Neutro Abs: 6.3 10*3/uL (ref 1.7–7.7)
Neutrophils Relative %: 68 %
Platelets: 127 10*3/uL — ABNORMAL LOW (ref 150–400)
RBC: 2.59 MIL/uL — ABNORMAL LOW (ref 3.87–5.11)
RDW: 14.2 % (ref 11.5–15.5)
WBC: 9.3 10*3/uL (ref 4.0–10.5)
nRBC: 0 % (ref 0.0–0.2)

## 2019-10-19 LAB — BPAM RBC
Blood Product Expiration Date: 202102242359
Blood Product Expiration Date: 202102252359
Unit Type and Rh: 9500
Unit Type and Rh: 9500

## 2019-10-19 MED ORDER — INSULIN NPH (HUMAN) (ISOPHANE) 100 UNIT/ML ~~LOC~~ SUSP
11.0000 [IU] | Freq: Every day | SUBCUTANEOUS | Status: DC
  Administered 2019-10-19: 11 [IU] via SUBCUTANEOUS
  Filled 2019-10-19: qty 10

## 2019-10-19 MED ORDER — INSULIN NPH (HUMAN) (ISOPHANE) 100 UNIT/ML ~~LOC~~ SUSP
13.0000 [IU] | Freq: Every day | SUBCUTANEOUS | Status: DC
  Administered 2019-10-20: 13 [IU] via SUBCUTANEOUS

## 2019-10-19 MED ORDER — METOCLOPRAMIDE HCL 10 MG PO TABS
10.0000 mg | ORAL_TABLET | Freq: Three times a day (TID) | ORAL | Status: DC
  Administered 2019-10-19 – 2019-10-21 (×7): 10 mg via ORAL
  Filled 2019-10-19 (×11): qty 1

## 2019-10-19 MED ORDER — ZOLPIDEM TARTRATE 5 MG PO TABS
5.0000 mg | ORAL_TABLET | Freq: Every evening | ORAL | Status: DC | PRN
  Administered 2019-10-19 – 2019-10-20 (×2): 5 mg via ORAL
  Filled 2019-10-19 (×2): qty 1

## 2019-10-19 MED ORDER — SODIUM CHLORIDE 0.9 % IV SOLN
510.0000 mg | Freq: Once | INTRAVENOUS | Status: AC
  Administered 2019-10-19: 510 mg via INTRAVENOUS
  Filled 2019-10-19: qty 17

## 2019-10-19 MED ORDER — METHYLDOPA 250 MG HALF TABLET
250.0000 mg | ORAL_TABLET | Freq: Two times a day (BID) | ORAL | Status: DC
  Administered 2019-10-20 – 2019-10-21 (×3): 250 mg via ORAL
  Filled 2019-10-19 (×5): qty 1

## 2019-10-19 MED ORDER — METHYLDOPA 250 MG HALF TABLET
250.0000 mg | ORAL_TABLET | Freq: Two times a day (BID) | ORAL | Status: DC
  Administered 2019-10-19: 250 mg via ORAL
  Filled 2019-10-19 (×3): qty 1

## 2019-10-19 MED ORDER — HYDROXYZINE HCL 50 MG PO TABS
50.0000 mg | ORAL_TABLET | Freq: Three times a day (TID) | ORAL | Status: DC | PRN
  Administered 2019-10-19 – 2019-10-20 (×2): 50 mg via ORAL
  Filled 2019-10-19 (×4): qty 1

## 2019-10-19 NOTE — Progress Notes (Signed)
S: I was called by the RN for pt desirers to leave AMA. Pt endorses feeling tired of being here, home sick, having a depressed time throughout her pregnancy, pt doesn't want to stay, pt denies having any friends to come visit, no partner in her life, and family members unable to come and stay. Pt doesn't seem to comprehend disease process. Pt also endorses feeling that she did not have sugar of BP problems at home. Education performed about disease process of preE and why her Bp is elevated now, stressed importance of her and her baby life to stay in house, pt was able to calm down and express feeling of being uncomfortable in bed.   O: BP (!) 144/92   Pulse 100   Temp 98.6 F (37 C) (Oral)   Resp 18   Ht 5' (1.524 m)   Wt 86.5 kg   LMP 03/14/2019 (Exact Date)   SpO2 98%   BMI 37.24 kg/m  Orientation: Breathing fast and crying teary eyed and wanting to go home.   A/P: Panic attack/depression: Pt had a panic attack was able to be calmed in 10 mins, pt had desired to leave AMA, pt verbalized agreement that she would not leave and verbalized understanding the importance of staying for health reasons, pt will discuss with Dr Simona Huh in the morning about delivery sooner then 34 week per pts desired, she would liek to have her baby delivered in one week. Pt was given an egg crate for bed, Kpad for heat, Ambien for sleep and atarax for anxiety as needed. Consulted with Dr Alesia Richards about medication management and POC, DR Alesia Richards agrees with plan. Recommendation for vampire diaries on nexflix, and RN to check if pt crokets and will supply pt with activities outlets.

## 2019-10-19 NOTE — Progress Notes (Signed)
On 2/20 patient declined eating evening meal due to feeling full from late lunch and not passing bowel movement. This RN stressed the importance of eating something before bed to prevent hypoglycemia. Patient ate a small piece of cheese and a handful of gapes around 2000. Due to the size of the snack not being >50% of a typical meal, novoLOG 10 units w/ meals was held. Bedtime blood glucose level @2214  was 166; 3 units slide scale of novoLOG given along with night time NOVOLIN N.   Fasting blood glucose level was 46 @0523 . 4oz of juice and 4oz of milk given. Blood glucose was 60 @0545 . CNM Derrell Lolling called @0550  and informed of low fasting glucose, interventions, and repeat blood glucose level. Orders received to give patient a snack with protein and recheck blood glucose level in 1 hour. Patient given [place] sandwich @0555 .      Cloy Cozzens R Darry Kelnhofer

## 2019-10-19 NOTE — Progress Notes (Addendum)
Patient ID: Kathy Lewis, female   DOB: 10/06/1995, 24 y.o.   MRN: [id number] S: No new complaints O:BP 139/84 (BP Location: Left Arm)   Pulse 97   Temp 98.8 F (37.1 C) (Oral)   Resp 17   Ht 5' (1.524 m)   Wt 86.5 kg   LMP 03/14/2019 (Exact Date)   SpO2 98%   BMI 37.24 kg/m   Intake/Output Summary (Last 24 hours) at 10/19/2019 1212 Last data filed at 10/19/2019 0934 Gross per 24 hour  Intake 480 ml  Output 1550 ml  Net -1070 ml   Intake/Output: I/O last 3 completed shifts: In: 1410 [P.O.:1410] Out: 1950 [Urine:1950]  Intake/Output this shift:  Total I/O In: -  Out: 350 [Urine:350] Weight change: 2.582 kg Gen: NAD  CVS: RRR no rub Resp: cta Abd: distended, +BS, nontender Ext: 2+ lower extremity edema bilaterally  Recent Labs  Lab 10/15/19 1434 10/16/19 0501 10/17/19 0558 10/18/19 0534 10/19/19 0806  NA 135 134* 136 137 136  K 4.7 4.9 4.6 4.1 4.6  CL 106 105 108 109 108  CO2 22 19* 21* 21* 22  GLUCOSE 97 304* 141* 73 169*  BUN 12 15 20 17 20   CREATININE 1.46* 1.81* 1.83* 1.83* 1.55*  ALBUMIN 1.7* 1.7* 1.5* 1.5* 1.4*  CALCIUM 8.1* 8.0* 8.0* 7.9* 7.7*  AST 24 18 19 19 19   ALT 14 15 13 14 15    Liver Function Tests: Recent Labs  Lab 10/17/19 0558 10/18/19 0534 10/19/19 0806  AST 19 19 19   ALT 13 14 15   ALKPHOS 61 59 59  BILITOT 0.3 0.5 0.4  PROT 4.4* 4.6* 4.4*  ALBUMIN 1.5* 1.5* 1.4*   No results for input(s): LIPASE, AMYLASE in the last 168 hours. No results for input(s): AMMONIA in the last 168 hours. CBC: Recent Labs  Lab 10/15/19 1434 10/15/19 1434 10/16/19 0501 10/16/19 0501 10/17/19 0558 10/18/19 0534 10/19/19 0806  WBC 7.2   < > 8.4   < > 8.8 8.5 7.9  HGB 8.9*   < > 8.4*   < > 7.8* 7.8* 7.6*  HCT 26.1*   < > 25.2*   < > 23.0* 23.3* 22.3*  MCV 93.2  --  92.6  --  92.0 94.3 92.5  PLT 133*   < > 123*   < > 124* 125* 117*   < > = values in this interval not displayed.   Cardiac Enzymes: No results for input(s): CKTOTAL, CKMB,  CKMBINDEX, TROPONINI in the last 168 hours. CBG: Recent Labs  Lab 10/18/19 1602 10/18/19 2214 10/19/19 0523 10/19/19 0545 10/19/19 0654  GLUCAP 114* 166* 46* 60* 134*    Iron Studies:  Recent Labs    10/18/19 0534  IRON 73  TIBC 301  FERRITIN 134   Studies/Results: No results found. Marland Kitchen aspirin  81 mg Oral Daily  . docusate sodium  100 mg Oral Daily  . escitalopram  10 mg Oral Daily  . insulin aspart  0-14 Units Subcutaneous TID PC  . insulin aspart  10 Units Subcutaneous TID WC  . insulin NPH Human  11 Units Subcutaneous QHS  . [START ON 10/20/2019] insulin NPH Human  13 Units Subcutaneous QAC breakfast  . iron polysaccharides  150 mg Oral Daily  . metoCLOPramide  10 mg Oral TID AC & HS  . metroNIDAZOLE  500 mg Oral Q12H  . NIFEdipine  60 mg Oral Daily  . prenatal multivitamin  1 tablet Oral Q1200  . valACYclovir  500 mg Oral  Daily    BMET    Component Value Date/Time   NA 136 10/19/2019 0806   K 4.6 10/19/2019 0806   CL 108 10/19/2019 0806   CO2 22 10/19/2019 0806   GLUCOSE 169 (H) 10/19/2019 0806   BUN 20 10/19/2019 0806   CREATININE 1.55 (H) 10/19/2019 0806   CALCIUM 7.7 (L) 10/19/2019 0806   GFRNONAA 47 (L) 10/19/2019 0806   GFRAA 54 (L) 10/19/2019 0806   CBC    Component Value Date/Time   WBC 7.9 10/19/2019 0806   RBC 2.41 (L) 10/19/2019 0806   HGB 7.6 (L) 10/19/2019 0806   HCT 22.3 (L) 10/19/2019 0806   PLT 117 (L) 10/19/2019 0806   MCV 92.5 10/19/2019 0806   MCH 31.5 10/19/2019 0806   MCHC 34.1 10/19/2019 0806   RDW 14.5 10/19/2019 0806   LYMPHSABS 1.6 07/07/2019 0036   MONOABS 0.2 07/07/2019 0036   EOSABS 0.1 07/07/2019 0036   BASOSABS 0.0 07/07/2019 0036    Assessment/Plan: 1. Preeclampsia- has nephrotic range proteinuria and worsening HTN.  Currently on procardia xl 60 mg with improvement.  Possible atypical HELLP per MFM.  1. On procardia XL 60 mg daily 2. Asa 81 mg daily 3. Cr improving 4. Platelets down slightly 5. Goal is to get  to [redacted] weeks gestation. 2. HTN:  BP much better.  We may need to add methyl-dopa or labetalol if her BP starts to climb again.  Hold off on diuretics for now. 3. AKI/CKD stage 3- slight bump in Scr likely due to combination of preeclampsia as well as significant anemia.   1. Scr improved from 1.83 to 1.55. 2. Continue to follow UOP and Scr 4. Type 1 DM- poorly controlled prior to pregnancy with improvement 5. CKD stage 3 - due to diabetic nephropathy.  Stressed importance of diabetic control with goal Hgb A1c <7%, BP control with goal <130/80, use of an ACE or ARB, however unable to use during pregnancy.  Also discussed the need to avoid NSAIDs/Cox-II I"s.  1. She has already established care at our office with Dr. Carolin Sicks. 6. Anemia- s/p blood transfusion.   1. Continue with oral iron  2. Will give IV feraheme 510 mg x 1 dose as per discussion with Dr. Mancel Bale 3. S/p blood transfusion prior to admission.   4. May require ESA although it will take weeks to take effect. 5. Transfuse prn  Donetta Potts, MD [facility] [phone]

## 2019-10-19 NOTE — Progress Notes (Signed)
Inpatient Diabetes Program Recommendations  Diabetes Treatment Program Recommendations  ADA Standards of Care  Diabetes in Pregnancy Target Glucose Ranges:  Fasting: 60 - 90 mg/dL Preprandial: 60 - 105 mg/dL 1 hr postprandial: Less than 140mg /dL (from first bite of meal) 2 hr postprandial: Less than 120 mg/dL (from first bite of meal)   Results for Kathy, Lewis (MRN [id number]) as of 10/19/2019 08:35  Ref. Range 10/18/2019 04:56 10/18/2019 05:21 10/18/2019 06:34 10/18/2019 10:40 10/18/2019 12:14 10/18/2019 12:34 10/18/2019 12:50 10/18/2019 16:02 10/18/2019 22:14 10/19/2019 05:23 10/19/2019 05:45 10/19/2019 06:54  Glucose-Capillary Latest Ref Range: 70 - 99 mg/dL 43 (LL) 61 (L) 90   Novolog 12 units  NPH 16 units 29 (LL) 52 (L) 72 114 (H)  Novolog 1 unit 166 (H)  Novolog 3 units   NPH 13 units 46 (L) 60 (L) 134 (H)   Review of Glycemic Control  Diabetes history:Type 1 DM Outpatient Diabetes medications:NPH 15 units BID, Novolog 5-15 units TID Current orders for Inpatient glycemic control:NPH 13 units BID, Novolog 10 units TID, Novolog 0-14 units TID  Inpatient Diabetes Program Recommendations:   Insulin-Basal: Please consider decreasing evening NPH to 11 units QHS (continue NPH 13 units QAM).   Insulin-Meal Coverage: If patient is not going to eat meals consistently, may want to change carbohydrate coverage to Novolog 0-12 units TID with meals (1 unit for every 5 grams of carbs).  NOTE: Noted per RNs notes, patient is not eating meals consistently. Since patient has Type 1 DM, she will require insulin coverage for carbohydrates consumed. Therefore, may want to change meal coverage to Novolog 0-12 units TID with meals in which 1 unit is given for every 5 grams of carbohydrates.  Thanks, Barnie Alderman, RN, MSN, CDE Diabetes Coordinator Inpatient Diabetes Program [phone] (Team Pager from 8am to 5pm)

## 2019-10-19 NOTE — Progress Notes (Signed)
Pt eating leftover hibachi from yesterday. Pt educated again on high sodium content. Dr. Marval Regal made aware and is ok with removing sodium restriction on diet while in the hospital so pt may have more choices.

## 2019-10-19 NOTE — Progress Notes (Addendum)
Patient ID: Kathy Lewis, female   DOB: [birth year], 24 y.o.   MRN: [id number] G2P0010 at [redacted]w[redacted]d, LOS# 4  S: Resting in bed, denies HA/NV/RUQ pain, still has vision changes - black spots in field of vision, constant. Episode of hypoglycemia during the night resolved w/ snack, feels better this morning, no further shakes or sweating. No LOF/VB, has not felt baby move this AM. Saw diabetic coordinator this morning recommendation to decrease night NPH to 11 units. Pt mood is tearful, pt endorses feeling sad and really wanting to go home, pt understand needing to be here, but feels home sick, pt does have visitors coming today, gave pt a 3D coloring book with supplies and pt smile, appears content now. Pt aware of how important it is for her to be here. Will continue medication regimen. Pt also endorses complaints of no BM in the 3 days, denies likely the hospital food.   O:  Vitals:   10/19/19 0410 10/19/19 0802  BP: (!) 145/95 139/84  Pulse: 95 97  Resp: 17 17  Temp: 98.1 F (36.7 C) 98.8 F (37.1 C)  SpO2: 100% 98%   BP 139/84 (BP Location: Left Arm)   Pulse 97   Temp 98.8 F (37.1 C) (Oral)   Resp 17   Ht 5' (1.524 m)   Wt 86.5 kg   LMP 03/14/2019 (Exact Date)   SpO2 98%   BMI 37.24 kg/m     Intake/Output Summary (Last 24 hours) at 10/19/2019 0906 Last data filed at 10/19/2019 0802 Gross per 24 hour  Intake 480 ml  Output 1450 ml  Net -970 ml   Filed Weights   10/17/19 0500 10/18/19 0400 10/19/19 0500  Weight: 81.4 kg 83.9 kg 86.5 kg   CBC Latest Ref Rng & Units 10/19/2019 10/18/2019 10/17/2019  WBC 4.0 - 10.5 K/uL 7.9 8.5 8.8  Hemoglobin 12.0 - 15.0 g/dL 7.6(L) 7.8(L) 7.8(L)  Hematocrit 36.0 - 46.0 % 22.3(L) 23.3(L) 23.0(L)  Platelets 150 - 400 K/uL 117(L) 125(L) 124(L)   CMP Latest Ref Rng & Units 10/19/2019 10/18/2019 10/17/2019  Glucose 70 - 99 mg/dL 169(H) 73 141(H)  BUN 6 - 20 mg/dL 20 17 20   Creatinine 0.44 - 1.00 mg/dL 1.55(H) 1.83(H) 1.83(H)  Sodium 135 - 145 mmol/L 136  137 136  Potassium 3.5 - 5.1 mmol/L 4.6 4.1 4.6  Chloride 98 - 111 mmol/L 108 109 108  CO2 22 - 32 mmol/L 22 21(L) 21(L)  Calcium 8.9 - 10.3 mg/dL 7.7(L) 7.9(L) 8.0(L)  Total Protein 6.5 - 8.1 g/dL 4.4(L) 4.6(L) 4.4(L)  Total Bilirubin 0.3 - 1.2 mg/dL 0.4 0.5 0.3  Alkaline Phos 38 - 126 U/L 59 59 61  AST 15 - 41 U/L 19 19 19   ALT 0 - 44 U/L 15 14 13    CBG (last 3)  Recent Labs    10/19/19 0523 10/19/19 0545 10/19/19 0654  GLUCAP 46* 60* 134*   [id number]: 135 bpm, moderate variability, + accels, no decels Toco: none  Gen: AAO x 3, NAD, teary eyed and expressing being homesick Abd: nontender, gravid GU deferred Ext: 1+Pitting edema to bilateral lower extremities, no calf tenderness, 2+DTR patellar, no clonus.    A/P: IUP @ 30 2/7 weeks, LOS# 4  H/o Gestational HTN now with preeclampsia with severe features due to visual changes. Possible atypical HELLP per MFM. Platelets (decreased from 125 to 117 today) and Creatinine stable (Baseline 1.4,  admission 1.8, current 1.55) . BP well controlled 139/84 with Procardia XL 60 mg. Continue  ASA 81 mg. Daily CBC and CMP Deliver at 34 weeks if pt remains stable.  Type I DM-previously controlled with NPH and Novolog outpatient 10/16/2019: Decreased NPH to 20 units BID and Novolog 12 units AC meals to  avoid hypoglycemia post steroids course, developed hypoglycemia overnight 10/17/2019: Will decrease further on  NPH 16 units BID and may go down to 13  units if hypoglycemic event reoccurs 10/18/2019: Decreased NPH to 13 units AC & HC due to hypoglycemia events, pt  not likely hospital choices for low sodium, given "Hibachi chicken from  family"  10/19/2019: Decreased NPH HS dose to 11 units, while keeping AC dose at 13  units, Novolog stays now at 10units with meals if pt eat at least 50% of her  meal, if not RN to hold insulin.  CBGs fasting, 2 hour postprandial. Sliding scale insulin, 0-14 units  EKG to assess cardiac status due to  DM completed 2/19 Appreciate Diabetic Coordinator recommendations to decrease night NPH to 11  units.    Nephropathy,  CKD Stage 2  Creatinine slightly above baseline but stable 24 hour protein is actually the same as in the first trimester but the Creatinine  clearance has declined from the first trimester. PCR went from 6.08-3.56 Nephrology consult completed 2/19with recommendations as follows:  - if BP starts to climb again can add methyl-dopa or labetalol  - hold off diuretics for now  - avoid NSAIDs/Cox-II I's (OK for baby ASA)  - anemia may require ESA although it will take weeks to take effect.              - keep BP less than 140/90.  Fetal well being- Category I tracing.  Normal growth. S/p BMZ #2 S/p NICU consult. NST q shift.  BPP once weekly.  Scheduled for Monday, 10/20/19. GBS PCR Negative.              Contractions-Resolved. +BV.  Started Flagyl 500 mg po BID x 7 days. GC/Chl Negative               Anemia- S/p 2 units PRBCs on 10/13/19. Hgb stable 7.6 today Iron daily, consider Feraheme if Hgb trending down, hgb drop from 7.8-7.6  Consider erythropoietin.  Consult Nephrology completed.  Anxiety- Doing well emotionally.  Visiting w/ parents outside daily x 1 hour. Coloring Book with activities given to her [place] consult on 2/19 Continue Lexapro 10 mg q pm. Monitor Mood  H/o HSV  Valtrex daily due to possible vaginal delivery in the near future.  Constipation. No BM in 3 days  Reglan order Q6H for constipation and gastroporesis.  Monitor  Dr. Mancel Bale updated and aware of POC.   Kathy Lewis, CNM, MSN 10/19/2019, 9:06 AM   Stage 3 CKD according to Dr. Elissa Hefty note from today Discussed the feraheme with Dr. Marval Regal and he will put in order and he confirms 81mg  aspirin is ok Reviewed pt not eating low sodium diet and he is ok with removing the low sodium restriction if that will help keep the pt from eating food from outside the hospital.   This may help with glycemic control.  Cat 1 tracing

## 2019-10-19 NOTE — Progress Notes (Signed)
Patient ID: Kathy Lewis, female   DOB: 02-Dec-1995, 24 y.o.   MRN: [id number] G2P0010 at [redacted]w[redacted]d, LOS# 4  S: Called by RN, pt c/o increased swelling in hands and blurred vision, RN checked sugar CBG was 44, I assessed the pt at bedside, currently endorses swelling in hands was better, denies HA, report black spots in vision remains the same and blurred vision has gotten better after drinking 8 ounces of juice, cbg recheck was 62, pt denies RUQ pain, sob, cp, or HA now. BP elevated to 144/92. Consulted with Dr Mancel Bale, orders placed for stat cbc, cmp, started aldomet 250mg  BID to be given at least three hours after iron supplement given which was 3.5 hours ago. Labs resulted stable.  Pt stable and inquiring about possible being able to have her baby in about a week, I referred the pt to talk with Dr Simona Huh on Monday. Pt educated on impotence of keeping the baby in as long as safety possible.   O:  Vitals:   10/19/19 1540 10/19/19 1647  BP: 128/75 (!) 144/92  Pulse: (!) 102 100  Resp: 18   Temp: 98.6 F (37 C)   SpO2: 98%    BP (!) 144/92   Pulse 100   Temp 98.6 F (37 C) (Oral)   Resp 18   Ht 5' (1.524 m)   Wt 86.5 kg   LMP 03/14/2019 (Exact Date)   SpO2 98%   BMI 37.24 kg/m     Intake/Output Summary (Last 24 hours) at 10/19/2019 1711 Last data filed at 10/19/2019 1540 Gross per 24 hour  Intake 477 ml  Output 1250 ml  Net -773 ml   Filed Weights   10/17/19 0500 10/18/19 0400 10/19/19 0500  Weight: 81.4 kg 83.9 kg 86.5 kg   CBC Latest Ref Rng & Units 10/19/2019 10/18/2019 10/17/2019  WBC 4.0 - 10.5 K/uL 7.9 8.5 8.8  Hemoglobin 12.0 - 15.0 g/dL 7.6(L) 7.8(L) 7.8(L)  Hematocrit 36.0 - 46.0 % 22.3(L) 23.3(L) 23.0(L)  Platelets 150 - 400 K/uL 117(L) 125(L) 124(L)   CMP Latest Ref Rng & Units 10/19/2019 10/18/2019 10/17/2019  Glucose 70 - 99 mg/dL 169(H) 73 141(H)  BUN 6 - 20 mg/dL 20 17 20   Creatinine 0.44 - 1.00 mg/dL 1.55(H) 1.83(H) 1.83(H)  Sodium 135 - 145 mmol/L 136 137 136   Potassium 3.5 - 5.1 mmol/L 4.6 4.1 4.6  Chloride 98 - 111 mmol/L 108 109 108  CO2 22 - 32 mmol/L 22 21(L) 21(L)  Calcium 8.9 - 10.3 mg/dL 7.7(L) 7.9(L) 8.0(L)  Total Protein 6.5 - 8.1 g/dL 4.4(L) 4.6(L) 4.4(L)  Total Bilirubin 0.3 - 1.2 mg/dL 0.4 0.5 0.3  Alkaline Phos 38 - 126 U/L 59 59 61  AST 15 - 41 U/L 19 19 19   ALT 0 - 44 U/L 15 14 13    CBG (last 3)  Recent Labs    10/19/19 0654 10/19/19 1413 10/19/19 1701  GLUCAP 134* 138* 43*   [id number]: 130 bpm, moderate variability, + accels, no decels Toco: none NST reactive  Gen: AAO x 3, NAD, teary eyed and expressing being homesick Abd: nontender, gravid GU deferred Ext: 1+Pitting edema to bilateral lower extremities, no calf tenderness, 2+DTR patellar, no clonus.    A/P: IUP @ 30 2/7 weeks, LOS# 4  H/o Gestational HTN now with preeclampsia with severe features due to visual changes. Possible atypical HELLP per MFM. Platelets (decreased from 125 to 117 today) and Creatinine stable (Baseline 1.4,  admission 1.8, current 1.55) . BP well  controlled 139/84 with Procardia XL 60 mg. Continue ASA 81 mg. Daily CBC and CMP Deliver at 34 weeks if pt remains stable.  6:07 PM Stat cbc, plat increased from 117-127, hgb increased from 7.6-8.2 Cmp, creatinine increased from 1.55-1.58, ast/alt unremarkable started aldomet 250mg  BID to keep BP<140/90s per nephrology.   Type I DM-previously controlled with NPH and Novolog outpatient 10/16/2019: Decreased NPH to 20 units BID and Novolog 12 units AC meals to  avoid hypoglycemia post steroids course, developed hypoglycemia overnight 10/17/2019: Will decrease further on  NPH 16 units BID and may go down to 13  units if hypoglycemic event reoccurs 10/18/2019: Decreased NPH to 13 units AC & HC due to hypoglycemia events, pt  not likely hospital choices for low sodium, given "Hibachi chicken from  family"  10/19/2019: Decreased NPH HS dose to 11 units, while keeping AC dose at 13  units,  Novolog stays now at 10units with meals if pt eat at least 50% of her  meal, if not RN to hold insulin.  CBGs fasting, 2 hour postprandial. Sliding scale insulin, 0-14 units  EKG to assess cardiac status due to DM completed 2/19 Appreciate Diabetic Coordinator recommendations to decrease night NPH to 11  units.    Nephropathy,  CKD Stage 2  Creatinine slightly above baseline but stable 24 hour protein is actually the same as in the first trimester but the Creatinine  clearance has declined from the first trimester. PCR went from 6.08-3.56 Nephrology consult completed 2/19with recommendations as follows:  - if BP starts to climb again can add methyl-dopa or labetalol  - hold off diuretics for now  - avoid NSAIDs/Cox-II I's (OK for baby ASA)  - anemia may require ESA although it will take weeks to take effect.              - keep BP less than 140/90.  6:10 PM Started aldomet 250mg  BID, BP 144/92 Dr. Marval Regal discontinued low NA diet to allow pt to eat more food to have better glycemic control.   Fetal well being- Category I tracing.  Normal growth. S/p BMZ #2 S/p NICU consult. NST q shift.  BPP once weekly.  Scheduled for Monday, 10/20/19. GBS PCR Negative.  6:11 PM NST reactive               Contractions-Resolved. +BV.  Started Flagyl 500 mg po BID x 7 days. GC/Chl Negative               Anemia- S/p 2 units PRBCs on 10/13/19. Hgb stable 7.6 today Iron daily, consider Feraheme if Hgb trending down, hgb drop from 7.8-7.6  Consider erythropoietin.  Consult Nephrology completed.  6:11 PM Received  feraheme with Dr. Marval Regal today.   Anxiety- Doing well emotionally.  Visiting w/ parents outside daily x 1 hour. Coloring Book with activities given to her [place] consult on 2/19 Continue Lexapro 10 mg q pm. Monitor Mood  6:11 PM Feeling less home sick, was visited by family   H/o HSV  Valtrex daily due to possible vaginal delivery in the near  future.  Constipation. No BM in 3 days  Reglan order Q6H for constipation and gastroporesis.  Monitor  Dr. Mancel Bale updated and aware of POC.   Noralyn Pick, CNM, MSN 10/19/2019, 5:11 PM

## 2019-10-19 NOTE — Progress Notes (Signed)
Hypoglycemic Event  CBG: 43  Treatment: 8 oz juice/soda  Symptoms: Vision changes  Follow-up CBG: Time:1723 CBG Result:62   CBG: 62  Treatment: 4 oz juice/soda  Symptoms: Vision changes  Follow-up CBG: Time: 1745 CBG Result:79   Comments/MD notified:[facility]

## 2019-10-20 ENCOUNTER — Ambulatory Visit (HOSPITAL_COMMUNITY): Payer: Managed Care, Other (non HMO)

## 2019-10-20 ENCOUNTER — Encounter (HOSPITAL_COMMUNITY): Payer: Self-pay

## 2019-10-20 ENCOUNTER — Inpatient Hospital Stay (HOSPITAL_BASED_OUTPATIENT_CLINIC_OR_DEPARTMENT_OTHER): Payer: Managed Care, Other (non HMO)

## 2019-10-20 DIAGNOSIS — O24013 Pre-existing diabetes mellitus, type 1, in pregnancy, third trimester: Secondary | ICD-10-CM | POA: Diagnosis not present

## 2019-10-20 DIAGNOSIS — O403XX0 Polyhydramnios, third trimester, not applicable or unspecified: Secondary | ICD-10-CM

## 2019-10-20 DIAGNOSIS — O26873 Cervical shortening, third trimester: Secondary | ICD-10-CM

## 2019-10-20 DIAGNOSIS — O1493 Unspecified pre-eclampsia, third trimester: Secondary | ICD-10-CM

## 2019-10-20 DIAGNOSIS — Z3A30 30 weeks gestation of pregnancy: Secondary | ICD-10-CM

## 2019-10-20 LAB — COMPREHENSIVE METABOLIC PANEL
ALT: 13 U/L (ref 0–44)
AST: 18 U/L (ref 15–41)
Albumin: 1.3 g/dL — ABNORMAL LOW (ref 3.5–5.0)
Alkaline Phosphatase: 54 U/L (ref 38–126)
Anion gap: 8 (ref 5–15)
BUN: 20 mg/dL (ref 6–20)
CO2: 20 mmol/L — ABNORMAL LOW (ref 22–32)
Calcium: 7.7 mg/dL — ABNORMAL LOW (ref 8.9–10.3)
Chloride: 109 mmol/L (ref 98–111)
Creatinine, Ser: 1.67 mg/dL — ABNORMAL HIGH (ref 0.44–1.00)
GFR calc Af Amer: 49 mL/min — ABNORMAL LOW (ref >60)
GFR calc non Af Amer: 43 mL/min — ABNORMAL LOW (ref >60)
Glucose, Bld: 78 mg/dL (ref 70–99)
Potassium: 4.1 mmol/L (ref 3.5–5.1)
Sodium: 137 mmol/L (ref 135–145)
Total Bilirubin: 0.5 mg/dL (ref 0.3–1.2)
Total Protein: 4.1 g/dL — ABNORMAL LOW (ref 6.5–8.1)

## 2019-10-20 LAB — PREPARE RBC (CROSSMATCH)

## 2019-10-20 LAB — GLUCOSE, CAPILLARY
Glucose-Capillary: 41 mg/dL — CL (ref 70–99)
Glucose-Capillary: 76 mg/dL (ref 70–99)
Glucose-Capillary: 89 mg/dL (ref 70–99)
Glucose-Capillary: 158 mg/dL — ABNORMAL HIGH (ref 70–99)
Glucose-Capillary: 155 mg/dL — ABNORMAL HIGH (ref 70–99)
Glucose-Capillary: 98 mg/dL (ref 70–99)
Glucose-Capillary: 79 mg/dL (ref 70–99)

## 2019-10-20 LAB — CBC
HCT: 19.9 % — ABNORMAL LOW (ref 36.0–46.0)
Hemoglobin: 7 g/dL — ABNORMAL LOW (ref 12.0–15.0)
MCH: 32 pg (ref 26.0–34.0)
MCHC: 35.2 g/dL (ref 30.0–36.0)
MCV: 90.9 fL (ref 80.0–100.0)
Platelets: 112 10*3/uL — ABNORMAL LOW (ref 150–400)
RBC: 2.19 MIL/uL — ABNORMAL LOW (ref 3.87–5.11)
RDW: 14.2 % (ref 11.5–15.5)
WBC: 7.1 10*3/uL (ref 4.0–10.5)
nRBC: 0 % (ref 0.0–0.2)

## 2019-10-20 LAB — URIC ACID: Uric Acid, Serum: 6.8 mg/dL (ref 2.5–7.1)

## 2019-10-20 LAB — LACTATE DEHYDROGENASE: LDH: 232 U/L — ABNORMAL HIGH (ref 98–192)

## 2019-10-20 MED ORDER — LORATADINE 10 MG PO TABS
10.0000 mg | ORAL_TABLET | Freq: Every day | ORAL | Status: DC
  Administered 2019-10-20 – 2019-10-21 (×2): 10 mg via ORAL
  Filled 2019-10-20 (×2): qty 1

## 2019-10-20 MED ORDER — ESCITALOPRAM OXALATE 5 MG PO TABS
15.0000 mg | ORAL_TABLET | Freq: Every day | ORAL | Status: DC
  Administered 2019-10-21: 15 mg via ORAL
  Filled 2019-10-20: qty 2
  Filled 2019-10-20: qty 1

## 2019-10-20 MED ORDER — NIFEDIPINE ER OSMOTIC RELEASE 90 MG PO TB24
90.0000 mg | ORAL_TABLET | Freq: Every day | ORAL | Status: DC
  Administered 2019-10-21: 90 mg via ORAL
  Filled 2019-10-20: qty 3
  Filled 2019-10-20: qty 1

## 2019-10-20 MED ORDER — ACETAMINOPHEN 325 MG PO TABS
650.0000 mg | ORAL_TABLET | Freq: Once | ORAL | Status: AC
  Administered 2019-10-20: 650 mg via ORAL
  Filled 2019-10-20: qty 2

## 2019-10-20 MED ORDER — NIFEDIPINE ER OSMOTIC RELEASE 30 MG PO TB24
30.0000 mg | ORAL_TABLET | Freq: Once | ORAL | Status: AC
  Administered 2019-10-20: 30 mg via ORAL
  Filled 2019-10-20: qty 1

## 2019-10-20 MED ORDER — ZOLPIDEM TARTRATE 5 MG PO TABS
5.0000 mg | ORAL_TABLET | Freq: Once | ORAL | Status: AC
  Administered 2019-10-20: 5 mg via ORAL

## 2019-10-20 MED ORDER — ESCITALOPRAM OXALATE 10 MG PO TABS: 15.0000 mg | ORAL_TABLET | Freq: Every day | ORAL | Status: DC

## 2019-10-20 MED ORDER — INSULIN ASPART 100 UNIT/ML ~~LOC~~ SOLN
8.0000 [IU] | Freq: Three times a day (TID) | SUBCUTANEOUS | Status: DC
  Administered 2019-10-20 – 2019-10-21 (×2): 8 [IU] via SUBCUTANEOUS

## 2019-10-20 MED ORDER — SODIUM CHLORIDE 0.9% IV SOLUTION: Freq: Once | INTRAVENOUS | Status: AC

## 2019-10-20 MED ORDER — OXYMETAZOLINE HCL 0.05 % NA SOLN
1.0000 | Freq: Two times a day (BID) | NASAL | Status: DC
  Administered 2019-10-20 – 2019-10-21 (×2): 1 via NASAL
  Filled 2019-10-20: qty 30

## 2019-10-20 MED ORDER — INSULIN NPH (HUMAN) (ISOPHANE) 100 UNIT/ML ~~LOC~~ SUSP
9.0000 [IU] | Freq: Every day | SUBCUTANEOUS | Status: DC
  Administered 2019-10-20: 9 [IU] via SUBCUTANEOUS

## 2019-10-20 MED ORDER — POLYSACCHARIDE IRON COMPLEX 150 MG PO CAPS
150.0000 mg | ORAL_CAPSULE | Freq: Every day | ORAL | Status: DC
  Administered 2019-10-20 – 2019-10-21 (×2): 150 mg via ORAL
  Filled 2019-10-20 (×2): qty 1

## 2019-10-20 NOTE — Progress Notes (Signed)
Discussed pt's new c/o blurry vision with Dr. Annamaria Boots, MFM. He recommends watching the pt's progression over the next few hours.  If improved, continue the pregnancy.  If pt still has blurry vision, proceed with delivery.

## 2019-10-20 NOTE — Progress Notes (Signed)
Patient ID: Kathy Lewis, female   DOB: 1996/08/21, 24 y.o.   MRN: [id number] G2P0010 at [redacted]w[redacted]d, LOS# 6  S: Pt without complaints but when I asked if scotomata were still present, pt reported they were still there and she also woke up with blurry vision.  She thought it was from the Ambien and Vistaril overnight. RNs state that pt had not informed them of the new visual changes.  It has been improving since she woke up 4 hour hours ago.  "It was bad, bad now it's just bad.  She denies contractions, fetus is active.  Denies headache or upper abdominal pain.  O:  Temp:  [98.1 F (36.7 C)-98.6 F (37 C)] 98.2 F (36.8 C) (02/22 1228) Pulse Rate:  [100-110] 100 (02/22 1244) Resp:  [18-19] 18 (02/22 1228) BP: (127-151)/(75-98) 151/98 (02/22 1244) SpO2:  [96 %-100 %] 100 % (02/22 1228) Weight:  [86.2 kg] 86.2 kg (02/22 0538)  Vitals:   10/20/19 1228 10/20/19 1244  BP: (!) 150/91 (!) 151/98  Pulse: (!) 102 100  Resp: 18   Temp: 98.2 F (36.8 C)   SpO2: 100%        Intake/Output Summary (Last 24 hours) at 10/20/2019 1314 Last data filed at 10/20/2019 1044 Gross per 24 hour  Intake 1387 ml  Output 2050 ml  Net -663 ml   Filed Weights   10/18/19 0400 10/19/19 0500 10/20/19 0538  Weight: 83.9 kg 86.5 kg 86.2 kg   Gen: AAO x 3, NAD, teary eyed and expressing being homesick Abd: nontender, gravid GU deferred Ext: 1+Pitting edema to bilateral lower extremities, no calf tenderness Neuro:  DTRs not illicited. Cervix: Deferred.    [id number]: 135 bpm, moderate variability, + accels, no decels Toco: none  BPP 6/8 (-2 for breathing)  AFI= 26 cm Short cervix. Cephalic     CBC Latest Ref Rng & Units 10/20/2019 10/19/2019 10/19/2019  WBC 4.0 - 10.5 K/uL 7.1 9.3 7.9  Hemoglobin 12.0 - 15.0 g/dL 7.0(L) 8.2(L) 7.6(L)  Hematocrit 36.0 - 46.0 % 19.9(L) 24.1(L) 22.3(L)  Platelets 150 - 400 K/uL 112(L) 127(L) 117(L)   CMP Latest Ref Rng & Units 10/20/2019 10/19/2019 10/19/2019  Glucose 70 -  99 mg/dL 78 50(L) 169(H)  BUN 6 - 20 mg/dL 20 20 20   Creatinine 0.44 - 1.00 mg/dL 1.67(H) 1.58(H) 1.55(H)  Sodium 135 - 145 mmol/L 137 138 136  Potassium 3.5 - 5.1 mmol/L 4.1 4.1 4.6  Chloride 98 - 111 mmol/L 109 109 108  CO2 22 - 32 mmol/L 20(L) 23 22  Calcium 8.9 - 10.3 mg/dL 7.7(L) 7.9(L) 7.7(L)  Total Protein 6.5 - 8.1 g/dL 4.1(L) 4.7(L) 4.4(L)  Total Bilirubin 0.3 - 1.2 mg/dL 0.5 0.7 0.4  Alkaline Phos 38 - 126 U/L 54 62 59  AST 15 - 41 U/L 18 21 19   ALT 0 - 44 U/L 13 14 15    CBG (last 3)  Recent Labs    10/19/19 1955 10/19/19 2201 10/20/19 0536  GLUCAP 154* 93 76     A/P: IUP @ 30 3/7 weeks, LOS# 6  Preeclampsia with severe features due to visual changes  Possible atypical HELLP per MFM. C/o blurry vision is concerning for worsening Preeclampsia but pt reports it's improving.  Continue to  observe.  D/w MFM Platelets (decreased from 117 to 112 today) and Creatinine stable (Baseline 1.4,  admission 1.8, current 1.67) . BP was controlled on Procardia XL 60 mg.  Aldomet was added yesterday.  Discussed with Dr.  Royce Macadamia (  Nephrology) and she agrees to max out Nifedipine, will increase to 90 mg. Continue ASA 81 mg. Daily CBC and CMP. I suspect pt will need to be delivered within the next few days due to worsening Preeclampsia.  Type I DM-previously controlled with NPH and Novolog outpatient Hypoglycemia over the weekend and NPH and Novolog has been decreased gradually.  Will Decrease Novolog to 8 units AC meals and NPH 9 units BID CBGs fasting, 2 hour postprandial. Sliding scale insulin, 0-14 units  EKG completed.  Review with anesthesia. Appreciate Diabetic Coordinator recommendations to decrease insulin.    Nephropathy,  CKD Stage 3  Creatinine down from admission. Increase Procardia as above. Discussed any possible renal indications for delivery with Dr. Royce Macadamia.  Recommends delivery for obstetric indications.  Fetal well being- Category I tracing.  Normal  growth. S/p BMZ #2 S/p NICU consult. NST q shift.  BPP 6/8.  New recommendation to perform BPP twice weekly, Mon/Thurs. GBS PCR Negative.              Contractions-Resolved. +BV.  Started Flagyl 500 mg po BID x 7 days. GC/Chl Negative               Anemia- Hg back down S/p 2 units PRBCs on 10/13/19.  S/p Feraheme 10/19/19. Transfuse 1 unit of PRBC today.  Infuse over 2 hours per Nephrology. Iron daily Consider erythropoietin.    Anxiety- Visiting w/ parents outside daily x 1 hour. Coloring Book with activities given to her [place] consult on 2/19 Increase Lexapro from 10 mg to 15 mg q pm. Monitor Mood  H/o HSV  Valtrex daily due to possible vaginal delivery in the near future.  Constipation. No BM in 3 days  Reglan order Q6H for constipation and gastroporesis.  Monitor  Dr. Landry Mellow covering until 7 pm tonight, [facility] from 7pm to 7 am.  [name],  10/20/2019, 1:14 PM   [phone]

## 2019-10-20 NOTE — Progress Notes (Signed)
RN at [facility] to start am NST, pt requesting to do monitoring after she comes back from visiting with family outside.

## 2019-10-20 NOTE — Progress Notes (Signed)
Initial Nutrition Assessment  DOCUMENTATION CODES:  Not applicable  INTERVENTION:  Carbohydrate modified gestational diabetic diet  NUTRITION DIAGNOSIS:  Increased nutrient needs related to (r/t pregancy and fetal growth requirements) as evidenced by (30 weeks IUP).  GOAL:  Patient will meet greater than or equal to 90% of their needs  MONITOR:  Weight trends  REASON FOR ASSESSMENT:  Gestational Diabetes   ASSESSMENT:   30 3/7 weeks, adm with PEC. DM I. Wt at 6 weeks 68.9 Kg, BMI 29.7. 28 lb weight gain - pt feels that some of weight gain is edema  Pt report only consuming fruit a breakfast due to feeling full. Ran the list of breakfast options and pt does not desire to eat any of the options  Snack delivery is reported to be sporadic - Please ensure pt at least receives bedtime snack  Pt has been ordering [place] burger and green beans for lunch and dinner. Discussed ordering other entrees with out a bun, and then adding 2 vegetables and a fruit. This may help GI motility.  Pt seems more satisfied with food options now that sodium restriction has been limited. Family unable to prepare low sodium meals and bring as they live 3 hours away     Diet Order:   Diet Order            Diet gestational carb mod Room service appropriate? Yes; Fluid consistency: Thin  Diet effective now              EDUCATION NEEDS:   No education needs have been identified at this time(Brief GDM diet educ 05/01/19)  Skin:  Skin Assessment: Reviewed RN Assessment  Last BM:    No BM X 4 days   Height:   Ht Readings from Last 1 Encounters:  10/15/19 5' (1.524 m)    Weight:   Wt Readings from Last 1 Encounters:  10/20/19 86.2 kg    Ideal Body Weight:   100 lbs  BMI:  Body mass index is 37.11 kg/m.  Estimated Nutritional Needs:   Kcal:  1700-1900  Protein:  75 - 85 g  Fluid:  2 L    Weyman Rodney M.Fredderick Severance LDN Neonatal Nutrition Support Specialist/RD III

## 2019-10-20 NOTE — Progress Notes (Signed)
Inpatient Diabetes Program Recommendations  AACE/ADA: New Consensus Statement on Inpatient Glycemic Control (2015)  Target Ranges:  Prepandial:   less than 140 mg/dL      Peak postprandial:   less than 180 mg/dL (1-2 hours)      Critically ill patients:  140 - 180 mg/dL   Lab Results  Component Value Date   GLUCAP 76 10/20/2019   HGBA1C 12.1 (H) 12/18/2014    Review of Glycemic Control Results for DESIRAY, KRUIZENGA (MRN [id number]) as of 10/20/2019 08:50  Ref. Range 10/19/2019 19:55 10/19/2019 22:01 10/20/2019 05:36  Glucose-Capillary Latest Ref Range: 70 - 99 mg/dL 154 (H) 93 76   Diabetes history:Type 1 DM Outpatient Diabetes medications:NPH 15 units BID, Novolog 5-15 units TID Current orders for Inpatient glycemic control:NPH13units QAM, 11 units QHS, Novolog 10units TID, Novolog 0-14 units TID  Inpatient Diabetes Program Recommendations:  Consider decreasing NPH to 9 units BID and decreasing Novolog 8 units TID (assuming patient is eating >50% of meals).  NOTE: Noted per RNs notes, patient is not eating meals consistently. Since patient has Type 1 DM, she will require insulin coverage for carbohydrates consumed. Therefore, may want to change meal coverage to Novolog 0-12 units TID with meals in which 1 unit is given for every 5 grams of carbohydrates.  Thanks, Bronson Curb, MSN, RNC-OB Diabetes Coordinator [phone] (8a-5p)

## 2019-10-20 NOTE — Progress Notes (Signed)
Nephrology Progress Note:   Patient ID: Kathy Lewis, female   DOB: 05/22/1996, 24 y.o.   MRN: [id number]  S:  Recheck bp per tech was 151/98 a couple of minutes ago. Methyldopa was added yesterday.  Has just met with dietician and is going to work on reducing salty foods.  Spoke with OB and she is adding 1 unit PRBC's and they are monitoring closely for indication for delivery.  Review of systems:  Denies n/v Denies shortness of breath or chest pain  Denies dizziness    O:BP (!) 150/91 (BP Location: Left Arm)   Pulse (!) 102   Temp 98.2 F (36.8 C) (Oral)   Resp 18   Ht 5' (1.524 m)   Wt 86.2 kg   LMP 03/14/2019 (Exact Date)   SpO2 100%   BMI 37.11 kg/m   Intake/Output Summary (Last 24 hours) at 10/20/2019 1239 Last data filed at 10/20/2019 1044 Gross per 24 hour  Intake 1387 ml  Output 2050 ml  Net -663 ml   Intake/Output: I/O last 3 completed shifts: In: 44 [P.O.:1390; IV Piggyback:117] Out: 2450 [Urine:2450]  Intake/Output this shift:  Total I/O In: -  Out: 600 [Urine:600] Weight change: -0.3 kg   Physical exam:  Gen: adult female NAD  CVS: RRR no rub Resp: cta unlabored   Abd: gravid uterus, nontender Ext: 2+ lower extremity edema bilaterally Psych normal mood and affect.  Neuro - alert and oriented x 3; follows commands and provides history  Recent Labs  Lab 10/15/19 1434 10/16/19 0501 10/17/19 0558 10/18/19 0534 10/19/19 0806 10/19/19 1718 10/20/19 0622  NA 135 134* 136 137 136 138 137  K 4.7 4.9 4.6 4.1 4.6 4.1 4.1  CL 106 105 108 109 108 109 109  CO2 22 19* 21* 21* 22 23 20*  GLUCOSE 97 304* 141* 73 169* 50* 78  BUN _0 CREATININE 1.46* 1.81* 1.83* 1.83* 1.55* 1.58* 1.67*  ALBUMIN 1.7* 1.7* 1.5* 1.5* 1.4* 1.6* 1.3*  CALCIUM 8.1* 8.0* 8.0* 7.9* 7.7* 7.9* 7.7*  AST _1 ALT _2 Liver Function Tests: Recent Labs  Lab 10/19/19 0806 10/19/19 1718 10/20/19 0622  AST _3 ALT _4 ALKPHOS 59 62 54  BILITOT 0.4 0.7 0.5  PROT 4.4* 4.7* 4.1*  ALBUMIN 1.4* 1.6* 1.3*   CBC: Recent Labs  Lab 10/17/19 0558 10/17/19 0558 10/18/19 0534 10/18/19 0534 10/19/19 0806 10/19/19 1718 10/20/19 0622  WBC 8.8   < > 8.5   < > 7.9 9.3 7.1  NEUTROABS  --   --   --   --   --  6.3  --   HGB 7.8*   < > 7.8*   < > 7.6* 8.2* 7.0*  HCT 23.0*   < > 23.3*   < > 22.3* 24.1* 19.9*  MCV 92.0  --  94.3  --  92.5 93.1 90.9  PLT 124*   < > 125*   < > 117* 127* 112*   < > = values in this interval not displayed.   Cardiac Enzymes: No results for input(s): CKTOTAL, CKMB, CKMBINDEX, TROPONINI in the last 168 hours. CBG: Recent Labs  Lab 10/19/19 1745 10/19/19 1833 10/19/19 1955 10/19/19 2201 10/20/19 0536  GLUCAP 79 158* 154* 93 76    Iron Studies:  Recent Labs    10/18/19 0534  IRON 73  TIBC 301  FERRITIN 134   Studies/Results: [place] MFM OB Transvaginal  Result Date: 10/20/2019 ----------------------------------------------------------------------  OBSTETRICS REPORT                       (Signed Final 10/20/2019 10:41 am) ---------------------------------------------------------------------- Patient Info  ID #:       [id number]                          D.O.B.:  April 24, 1996 (23 yrs)  Name:       Kathy Lewis                   Visit Date: 10/20/2019 09:22 am ---------------------------------------------------------------------- Performed By  Performed By:     Novella Rob        Ref. Address:     [address]                                                             [address]                                                             [place]  Attending:        Tama High MD        Secondary Phy.:   [facility]                                                             Care  Referred By:      Estill Bamberg                 Location[facility]                               Fetal Care ---------------------------------------------------------------------- Orders   #  Description                          Code         Ordered By   1  [place] MFM FETAL BPP WO NON              [id number]  EVELYN VARNADO      STRESS   2  [place] MFM OB TRANSVAGINAL               [id number]      [facility] VARNADO  ----------------------------------------------------------------------   #  Order #                    Accession #                 Episode #   1  [id number]                  [id number]                  [id number]   2  [id number]                  [id number]                  [id number]  ---------------------------------------------------------------------- Indications   Pre-eclampsia                                  O14.90   Cervical shortening, third trimester           O26.873   Polyhydramnios, third trimester, antepartum    O40.3XX0   condition or complication, unspecified fetus   Encounter for cervical length                  Z36.86   Pre-existing diabetes, type 1, in pregnancy,   O24.013   third trimester   [redacted] weeks gestation of pregnancy                Z3A.30  ---------------------------------------------------------------------- Fetal Evaluation  Num Of Fetuses:         1  Fetal Heart Rate(bpm):  133  Cardiac Activity:       Observed  Presentation:           Cephalic  Placenta:               Posterior  P. Cord Insertion:      Previously Visualized  Amniotic Fluid  AFI FV:      Polyhydramnios  AFI Sum(cm)     %Tile       Largest Pocket(cm)  26.11           > 97        8.79  RUQ(cm)       RLQ(cm)       LUQ(cm)        LLQ(cm)  6.12          6.22          8.79           4.98 ---------------------------------------------------------------------- Biophysical Evaluation  Amniotic F.V:   Within normal limits       F. Tone:        Observed  F. Movement:    Observed                   Score:          6/8  F. Breathing:   Not Observed  ---------------------------------------------------------------------- OB History  Gravidity:    2         Term:   0        Prem:   0        SAB:   1  TOP:  0       Ectopic:  0        Living: 0 ---------------------------------------------------------------------- Gestational Age  LMP:           31w 3d        Date:  03/14/19                 EDD:   12/19/19  Best:          Weston Settle 3d     Det. ByLoman Chroman         EDD:   12/26/19                                      (04/30/19) ---------------------------------------------------------------------- Cervix Uterus Adnexa  Cervix  Length:            0.4  cm.  See comments ---------------------------------------------------------------------- Impression  G2 P0.  Patient with type 1 diabetes with diabetic  nephropathy was admitted 5 days ago with diagnosis of  preeclampsia.  On ultrasound performed at admission, fetal  growth was appropriate for gestational age.  On today's ultrasound, polyhydramnios is seen (AFI= 26 cm).  Cephalic presentation.  Fetal breathing movements did not  meet the criteria of BPP.  I reviewed the NST which is  reactive.  BPP 6/8.  Because of the appearance of cervical shortening, we  performed transvaginal ultrasound to evaluate the cervix.  The cervical canal is dilated with a residual closed portion of  cervix measuring 4 millimeters.  Cervical shortening and polyhydramnios increase the risk of  preterm delivery. ---------------------------------------------------------------------- Recommendations  -Twice-weekly BPP (Mon-Thu). ----------------------------------------------------------------------                  Tama High, MD Electronically Signed Final Report   10/20/2019 10:41 am ----------------------------------------------------------------------  [place] MFM FETAL BPP WO NON STRESS  Result Date: 10/20/2019 ----------------------------------------------------------------------  OBSTETRICS REPORT                       (Signed  Final 10/20/2019 10:41 am) ---------------------------------------------------------------------- Patient Info  ID #:       [id number]                          D.O.B.:  1996/08/01 (23 yrs)  Name:       Kathy Shipper Paull                   Visit Date: 10/20/2019 09:22 am ---------------------------------------------------------------------- Performed By  Performed By:     Novella Rob        Ref. Address:     [place]                                                             [address]  [place]                                                             [id number]  Attending:        Tama High MD        Secondary Phy.:   [facility]                                                             Care  Referred By:      Estill Bamberg                 [facility]                               Fetal Care ---------------------------------------------------------------------- Orders   #  Description                          Code         Ordered By   1  [place] MFM FETAL BPP WO NON              76819.01     EVELYN VARNADO      STRESS   2  [place] MFM OB TRANSVAGINAL               32202.5      EVELYN VARNADO  ----------------------------------------------------------------------   #  Order #                    Accession #                 Episode #   1  [id number]                  [id number]                  [id number]   2  [id number]                  [id number]                  [id number]  ---------------------------------------------------------------------- Indications   Pre-eclampsia                                  O14.90   Cervical shortening, third trimester           O26.873   Polyhydramnios, third trimester, antepartum    O40.3XX0   condition or complication, unspecified fetus   Encounter for cervical length                  Z36.86   Pre-existing diabetes, type 1, in pregnancy,   O24.013    third trimester   [redacted] weeks gestation of pregnancy  Z3A.30  ---------------------------------------------------------------------- Fetal Evaluation  Num Of Fetuses:         1  Fetal Heart Rate(bpm):  133  Cardiac Activity:       Observed  Presentation:           Cephalic  Placenta:               Posterior  P. Cord Insertion:      Previously Visualized  Amniotic Fluid  AFI FV:      Polyhydramnios  AFI Sum(cm)     %Tile       Largest Pocket(cm)  26.11           > 97        8.79  RUQ(cm)       RLQ(cm)       LUQ(cm)        LLQ(cm)  6.12          6.22          8.79           4.98 ---------------------------------------------------------------------- Biophysical Evaluation  Amniotic F.V:   Within normal limits       F. Tone:        Observed  F. Movement:    Observed                   Score:          6/8  F. Breathing:   Not Observed ---------------------------------------------------------------------- OB History  Gravidity:    2         Term:   0        Prem:   0        SAB:   1  TOP:          0       Ectopic:  0        Living: 0 ---------------------------------------------------------------------- Gestational Age  LMP:           31w 3d        Date:  03/14/19                 EDD:   12/19/19  Best:          Weston Settle 3d     Det. ByLoman Chroman         EDD:   12/26/19                                      (04/30/19) ---------------------------------------------------------------------- Cervix Uterus Adnexa  Cervix  Length:            0.4  cm.  See comments ---------------------------------------------------------------------- Impression  G2 P0.  Patient with type 1 diabetes with diabetic  nephropathy was admitted 5 days ago with diagnosis of  preeclampsia.  On ultrasound performed at admission, fetal  growth was appropriate for gestational age.  On today's ultrasound, polyhydramnios is seen (AFI= 26 cm).  Cephalic presentation.  Fetal breathing movements did not  meet the criteria of BPP.  I reviewed the NST  which is  reactive.  BPP 6/8.  Because of the appearance of cervical shortening, we  performed transvaginal ultrasound to evaluate the cervix.  The cervical canal is dilated with a residual closed portion of  cervix measuring 4 millimeters.  Cervical shortening and polyhydramnios increase the risk of  preterm delivery. ---------------------------------------------------------------------- Recommendations  -Twice-weekly BPP (Mon-Thu). ----------------------------------------------------------------------  Tama High, MD Electronically Signed Final Report   10/20/2019 10:41 am ----------------------------------------------------------------------  . aspirin  81 mg Oral Daily  . docusate sodium  100 mg Oral Daily  . escitalopram  10 mg Oral Daily  . insulin aspart  0-14 Units Subcutaneous TID PC  . insulin aspart  10 Units Subcutaneous TID WC  . insulin NPH Human  11 Units Subcutaneous QHS  . insulin NPH Human  13 Units Subcutaneous QAC breakfast  . methyldopa  250 mg Oral BID  . metoCLOPramide  10 mg Oral TID AC & HS  . metroNIDAZOLE  500 mg Oral Q12H  . NIFEdipine  60 mg Oral Daily  . prenatal multivitamin  1 tablet Oral Q1200  . valACYclovir  500 mg Oral Daily    BMET    Component Value Date/Time   NA 137 10/20/2019 0622   K 4.1 10/20/2019 0622   CL 109 10/20/2019 0622   CO2 20 (L) 10/20/2019 0622   GLUCOSE 78 10/20/2019 0622   BUN 20 10/20/2019 0622   CREATININE 1.67 (H) 10/20/2019 0622   CALCIUM 7.7 (L) 10/20/2019 0622   GFRNONAA 43 (L) 10/20/2019 0622   GFRAA 49 (L) 10/20/2019 0622   CBC    Component Value Date/Time   WBC 7.1 10/20/2019 0622   RBC 2.19 (L) 10/20/2019 0622   HGB 7.0 (L) 10/20/2019 0622   HCT 19.9 (L) 10/20/2019 0622   PLT 112 (L) 10/20/2019 0622   MCV 90.9 10/20/2019 0622   MCH 32.0 10/20/2019 0622   MCHC 35.2 10/20/2019 0622   RDW 14.2 10/20/2019 0622   LYMPHSABS 2.2 10/19/2019 1718   MONOABS 0.6 10/19/2019 1718   EOSABS 0.1  10/19/2019 1718   BASOSABS 0.0 10/19/2019 1718    Assessment/Plan: 1. Preeclampsia- has nephrotic range proteinuria and worsening HTN.  Currently on procardia xl 60 mg with improvement.  Possible atypical HELLP per MFM.  1. On procardia XL 60 mg daily - increase to 90 mg daily (120 is considered max) 2. Asa 81 mg daily 3. Cr 1.67 4. Note thrombocytopenia   5. S/p betamethasone per OB 6. Timing of delivery per OB/MFM 2. HTN: Increase procardia to 90 mg daily (ordered another 30 for today). Methyldopa per OB/MFM discretion   3. AKI/CKD stage 3- slight bump in Scr likely due to combination of preeclampsia as well as significant anemia.   1. Scr 1.67 2. Continue to follow UOP and Scr 4. Type 1 DM- poorly controlled prior to pregnancy with improvement 5. CKD stage 3 - due to diabetic nephropathy.  Stressed importance of diabetic control with goal Hgb A1c <7%, BP control with goal <130/80, use of an ACE or ARB, however unable to use during pregnancy.  Also discussed the need to avoid NSAIDs/Cox-II I"s.  1. She has already established care at our office with Dr. Carolin Sicks.  6. Anemia- s/p blood transfusion.   1. Spoke with OB and they are ordering a unit of blood for her today  2. Continue with oral iron   3. S/p IV feraheme 510 mg x 1 dose on 2/21 as per discussion with Dr. Mancel Bale  4. S/p blood transfusion prior to admission.   5. May require ESA although it will take weeks to take effect  6. Transfuse prn  Spoke with OB.  Claudia Desanctis, MD  10/20/2019 1:14 PM

## 2019-10-21 ENCOUNTER — Inpatient Hospital Stay (HOSPITAL_COMMUNITY): Payer: Managed Care, Other (non HMO) | Admitting: Anesthesiology

## 2019-10-21 ENCOUNTER — Encounter (HOSPITAL_COMMUNITY): Payer: Self-pay | Admitting: Obstetrics and Gynecology

## 2019-10-21 ENCOUNTER — Inpatient Hospital Stay (HOSPITAL_COMMUNITY): Payer: Managed Care, Other (non HMO)

## 2019-10-21 LAB — COMPREHENSIVE METABOLIC PANEL
ALT: 13 U/L (ref 0–44)
ALT: 11 U/L (ref 0–44)
AST: 18 U/L (ref 15–41)
AST: 18 U/L (ref 15–41)
Albumin: 1.6 g/dL — ABNORMAL LOW (ref 3.5–5.0)
Albumin: 1.6 g/dL — ABNORMAL LOW (ref 3.5–5.0)
Alkaline Phosphatase: 60 U/L (ref 38–126)
Alkaline Phosphatase: 64 U/L (ref 38–126)
Anion gap: 9 (ref 5–15)
Anion gap: 7 (ref 5–15)
BUN: 18 mg/dL (ref 6–20)
BUN: 16 mg/dL (ref 6–20)
CO2: 20 mmol/L — ABNORMAL LOW (ref 22–32)
CO2: 21 mmol/L — ABNORMAL LOW (ref 22–32)
Calcium: 7.8 mg/dL — ABNORMAL LOW (ref 8.9–10.3)
Calcium: 7.7 mg/dL — ABNORMAL LOW (ref 8.9–10.3)
Chloride: 108 mmol/L (ref 98–111)
Chloride: 106 mmol/L (ref 98–111)
Creatinine, Ser: 1.59 mg/dL — ABNORMAL HIGH (ref 0.44–1.00)
Creatinine, Ser: 1.68 mg/dL — ABNORMAL HIGH (ref 0.44–1.00)
GFR calc Af Amer: 52 mL/min — ABNORMAL LOW (ref >60)
GFR calc Af Amer: 49 mL/min — ABNORMAL LOW (ref >60)
GFR calc non Af Amer: 45 mL/min — ABNORMAL LOW (ref >60)
GFR calc non Af Amer: 42 mL/min — ABNORMAL LOW (ref >60)
Glucose, Bld: 99 mg/dL (ref 70–99)
Glucose, Bld: 160 mg/dL — ABNORMAL HIGH (ref 70–99)
Potassium: 3.9 mmol/L (ref 3.5–5.1)
Potassium: 4 mmol/L (ref 3.5–5.1)
Sodium: 137 mmol/L (ref 135–145)
Sodium: 134 mmol/L — ABNORMAL LOW (ref 135–145)
Total Bilirubin: 0.9 mg/dL (ref 0.3–1.2)
Total Bilirubin: 0.3 mg/dL (ref 0.3–1.2)
Total Protein: 4.6 g/dL — ABNORMAL LOW (ref 6.5–8.1)
Total Protein: 4.9 g/dL — ABNORMAL LOW (ref 6.5–8.1)

## 2019-10-21 LAB — CBC
HCT: 25.1 % — ABNORMAL LOW (ref 36.0–46.0)
HCT: 26.9 % — ABNORMAL LOW (ref 36.0–46.0)
Hemoglobin: 8.7 g/dL — ABNORMAL LOW (ref 12.0–15.0)
Hemoglobin: 9.1 g/dL — ABNORMAL LOW (ref 12.0–15.0)
MCH: 31.4 pg (ref 26.0–34.0)
MCH: 31.2 pg (ref 26.0–34.0)
MCHC: 34.7 g/dL (ref 30.0–36.0)
MCHC: 33.8 g/dL (ref 30.0–36.0)
MCV: 90.6 fL (ref 80.0–100.0)
MCV: 92.1 fL (ref 80.0–100.0)
Platelets: 138 10*3/uL — ABNORMAL LOW (ref 150–400)
Platelets: 146 10*3/uL — ABNORMAL LOW (ref 150–400)
RBC: 2.77 MIL/uL — ABNORMAL LOW (ref 3.87–5.11)
RBC: 2.92 MIL/uL — ABNORMAL LOW (ref 3.87–5.11)
RDW: 14.4 % (ref 11.5–15.5)
RDW: 14.4 % (ref 11.5–15.5)
WBC: 8.9 10*3/uL (ref 4.0–10.5)
WBC: 9.2 10*3/uL (ref 4.0–10.5)
nRBC: 0 % (ref 0.0–0.2)
nRBC: 0 % (ref 0.0–0.2)

## 2019-10-21 LAB — GLUCOSE, CAPILLARY
Glucose-Capillary: 90 mg/dL (ref 70–99)
Glucose-Capillary: 135 mg/dL — ABNORMAL HIGH (ref 70–99)
Glucose-Capillary: 53 mg/dL — ABNORMAL LOW (ref 70–99)
Glucose-Capillary: 63 mg/dL — ABNORMAL LOW (ref 70–99)
Glucose-Capillary: 64 mg/dL — ABNORMAL LOW (ref 70–99)
Glucose-Capillary: 83 mg/dL (ref 70–99)
Glucose-Capillary: 165 mg/dL — ABNORMAL HIGH (ref 70–99)
Glucose-Capillary: 140 mg/dL — ABNORMAL HIGH (ref 70–99)
Glucose-Capillary: 123 mg/dL — ABNORMAL HIGH (ref 70–99)
Glucose-Capillary: 115 mg/dL — ABNORMAL HIGH (ref 70–99)
Glucose-Capillary: 138 mg/dL — ABNORMAL HIGH (ref 70–99)
Glucose-Capillary: 124 mg/dL — ABNORMAL HIGH (ref 70–99)
Glucose-Capillary: 105 mg/dL — ABNORMAL HIGH (ref 70–99)
Glucose-Capillary: 91 mg/dL (ref 70–99)

## 2019-10-21 LAB — FOLATE RBC
Folate, Hemolysate: 402 ng/mL
Folate, RBC: 1748 ng/mL (ref >498)
Hematocrit: 23 % — ABNORMAL LOW (ref 34.0–46.6)

## 2019-10-21 LAB — MAGNESIUM: Magnesium: 5.2 mg/dL — ABNORMAL HIGH (ref 1.7–2.4)

## 2019-10-21 MED ORDER — DEXTROSE 50 % IV SOLN: 0.0000 mL | INTRAVENOUS | Status: DC | PRN

## 2019-10-21 MED ORDER — LACTATED RINGERS IV SOLN
500.0000 mL | Freq: Once | INTRAVENOUS | Status: AC
  Administered 2019-10-21: 500 mL via INTRAVENOUS

## 2019-10-21 MED ORDER — PHENYLEPHRINE 40 MCG/ML (10ML) SYRINGE FOR IV PUSH (FOR BLOOD PRESSURE SUPPORT)
80.0000 ug | PREFILLED_SYRINGE | INTRAVENOUS | Status: AC | PRN
  Administered 2019-10-21: 80 ug via INTRAVENOUS
  Administered 2019-10-21: 120 ug via INTRAVENOUS
  Administered 2019-10-22: 80 ug via INTRAVENOUS

## 2019-10-21 MED ORDER — DIPHENHYDRAMINE HCL 50 MG/ML IJ SOLN: 12.5000 mg | INTRAMUSCULAR | Status: DC | PRN

## 2019-10-21 MED ORDER — FENTANYL-BUPIVACAINE-NACL 0.5-0.125-0.9 MG/250ML-% EP SOLN
12.0000 mL/h | EPIDURAL | Status: DC | PRN
  Filled 2019-10-21: qty 250

## 2019-10-21 MED ORDER — METOCLOPRAMIDE HCL 10 MG PO TABS
10.0000 mg | ORAL_TABLET | Freq: Four times a day (QID) | ORAL | Status: DC | PRN
  Filled 2019-10-21: qty 1

## 2019-10-21 MED ORDER — HYDRALAZINE HCL 20 MG/ML IJ SOLN: 10.0000 mg | INTRAMUSCULAR | Status: DC | PRN

## 2019-10-21 MED ORDER — MAGNESIUM SULFATE 40 GM/1000ML IV SOLN: 0.5000 g/h | INTRAVENOUS | Status: DC

## 2019-10-21 MED ORDER — LIDOCAINE HCL (PF) 1 % IJ SOLN
30.0000 mL | INTRAMUSCULAR | Status: AC | PRN
  Administered 2019-10-21: 22:00:00 6 mL via SUBCUTANEOUS
  Administered 2019-10-21: 22:00:00 5 mL via SUBCUTANEOUS

## 2019-10-21 MED ORDER — SODIUM CHLORIDE 0.9 % IV SOLN: INTRAVENOUS | Status: DC

## 2019-10-21 MED ORDER — FENTANYL CITRATE (PF) 100 MCG/2ML IJ SOLN: 50.0000 ug | INTRAMUSCULAR | Status: DC | PRN

## 2019-10-21 MED ORDER — LABETALOL HCL 5 MG/ML IV SOLN: 80.0000 mg | INTRAVENOUS | Status: DC | PRN

## 2019-10-21 MED ORDER — LABETALOL HCL 5 MG/ML IV SOLN: 40.0000 mg | INTRAVENOUS | Status: DC | PRN

## 2019-10-21 MED ORDER — ACETAMINOPHEN 325 MG PO TABS
650.0000 mg | ORAL_TABLET | ORAL | Status: DC | PRN
  Administered 2019-10-22: 650 mg via ORAL

## 2019-10-21 MED ORDER — LIDOCAINE HCL URETHRAL/MUCOSAL 2 % EX GEL
1.0000 "application " | Freq: Once | CUTANEOUS | Status: AC
  Administered 2019-10-21: 1 via URETHRAL
  Filled 2019-10-21: qty 20

## 2019-10-21 MED ORDER — TERBUTALINE SULFATE 1 MG/ML IJ SOLN: 0.2500 mg | Freq: Once | INTRAMUSCULAR | Status: DC | PRN

## 2019-10-21 MED ORDER — ONDANSETRON HCL 4 MG/2ML IJ SOLN
4.0000 mg | Freq: Four times a day (QID) | INTRAMUSCULAR | Status: DC | PRN
  Administered 2019-10-21 (×2): 4 mg via INTRAVENOUS
  Filled 2019-10-21 (×2): qty 2

## 2019-10-21 MED ORDER — EPHEDRINE 5 MG/ML INJ: 10.0000 mg | INTRAVENOUS | Status: DC | PRN

## 2019-10-21 MED ORDER — LACTATED RINGERS IV SOLN: 500.0000 mL | INTRAVENOUS | Status: DC | PRN

## 2019-10-21 MED ORDER — SOD CITRATE-CITRIC ACID 500-334 MG/5ML PO SOLN
30.0000 mL | ORAL | Status: DC | PRN
  Administered 2019-10-22: 30 mL via ORAL
  Filled 2019-10-21: qty 30

## 2019-10-21 MED ORDER — MAGNESIUM SULFATE 40 GM/1000ML IV SOLN
INTRAVENOUS | Status: AC
  Administered 2019-10-21: 4 g via INTRAVENOUS
  Filled 2019-10-21: qty 1000

## 2019-10-21 MED ORDER — OXYTOCIN 40 UNITS IN NORMAL SALINE INFUSION - SIMPLE MED: 2.5000 [IU]/h | INTRAVENOUS | Status: DC

## 2019-10-21 MED ORDER — DEXTROSE IN LACTATED RINGERS 5 % IV SOLN: INTRAVENOUS | Status: DC

## 2019-10-21 MED ORDER — MAGNESIUM SULFATE BOLUS VIA INFUSION
4.0000 g | Freq: Once | INTRAVENOUS | Status: AC
  Administered 2019-10-21: 4 g via INTRAVENOUS
  Filled 2019-10-21: qty 1000

## 2019-10-21 MED ORDER — OXYTOCIN 40 UNITS IN NORMAL SALINE INFUSION - SIMPLE MED
1.0000 m[IU]/min | INTRAVENOUS | Status: DC
  Administered 2019-10-21: 1 m[IU]/min via INTRAVENOUS
  Filled 2019-10-21: qty 1000

## 2019-10-21 MED ORDER — LABETALOL HCL 5 MG/ML IV SOLN: 20.0000 mg | INTRAVENOUS | Status: DC | PRN

## 2019-10-21 MED ORDER — OXYCODONE-ACETAMINOPHEN 5-325 MG PO TABS: 1.0000 | ORAL_TABLET | ORAL | Status: DC | PRN

## 2019-10-21 MED ORDER — SALINE SPRAY 0.65 % NA SOLN
1.0000 | NASAL | Status: DC | PRN
  Administered 2019-10-22: 04:00:00 1 via NASAL
  Filled 2019-10-21: qty 44

## 2019-10-21 MED ORDER — INSULIN REGULAR(HUMAN) IN NACL 100-0.9 UT/100ML-% IV SOLN
INTRAVENOUS | Status: DC
  Administered 2019-10-21: 3 [IU]/h via INTRAVENOUS
  Filled 2019-10-21 (×2): qty 100

## 2019-10-21 MED ORDER — OXYTOCIN BOLUS FROM INFUSION
500.0000 mL | Freq: Once | INTRAVENOUS | Status: AC
  Administered 2019-10-22: 500 mL via INTRAVENOUS

## 2019-10-21 MED ORDER — INSULIN ASPART 100 UNIT/ML ~~LOC~~ SOLN: 0.0000 [IU] | SUBCUTANEOUS | Status: DC

## 2019-10-21 MED ORDER — MISOPROSTOL 25 MCG QUARTER TABLET
25.0000 ug | ORAL_TABLET | ORAL | Status: DC | PRN
  Administered 2019-10-21: 25 ug via VAGINAL
  Filled 2019-10-21 (×2): qty 1

## 2019-10-21 MED ORDER — SODIUM CHLORIDE (PF) 0.9 % IJ SOLN
INTRAMUSCULAR | Status: DC | PRN
  Administered 2019-10-21: 12 mL/h via EPIDURAL

## 2019-10-21 MED ORDER — FLEET ENEMA 7-19 GM/118ML RE ENEM: 1.0000 | ENEMA | RECTAL | Status: DC | PRN

## 2019-10-21 MED ORDER — LACTATED RINGERS IV SOLN: INTRAVENOUS | Status: DC

## 2019-10-21 MED ORDER — BUTALBITAL-APAP-CAFFEINE 50-325-40 MG PO TABS: 1.0000 | ORAL_TABLET | ORAL | Status: DC | PRN

## 2019-10-21 MED ORDER — DEXTROSE-NACL 5-0.45 % IV SOLN: INTRAVENOUS | Status: DC

## 2019-10-21 MED ORDER — FUROSEMIDE 10 MG/ML IJ SOLN
20.0000 mg | Freq: Once | INTRAMUSCULAR | Status: AC
  Administered 2019-10-21: 20 mg via INTRAVENOUS
  Filled 2019-10-21: qty 2

## 2019-10-21 MED ORDER — OXYCODONE-ACETAMINOPHEN 5-325 MG PO TABS: 2.0000 | ORAL_TABLET | ORAL | Status: DC | PRN

## 2019-10-21 MED ORDER — LACTATED RINGERS IV BOLUS
125.0000 mL | Freq: Once | INTRAVENOUS | Status: AC
  Administered 2019-10-21: 125 mL via INTRAVENOUS

## 2019-10-21 MED ORDER — ZOLPIDEM TARTRATE 5 MG PO TABS: 5.0000 mg | ORAL_TABLET | Freq: Every evening | ORAL | Status: DC | PRN

## 2019-10-21 MED ORDER — HYDROXYZINE HCL 50 MG PO TABS
50.0000 mg | ORAL_TABLET | Freq: Four times a day (QID) | ORAL | Status: DC | PRN
  Filled 2019-10-21: qty 1

## 2019-10-21 MED ORDER — INSULIN NPH (HUMAN) (ISOPHANE) 100 UNIT/ML ~~LOC~~ SUSP
9.0000 [IU] | Freq: Two times a day (BID) | SUBCUTANEOUS | Status: DC
  Filled 2019-10-21: qty 10

## 2019-10-21 MED ORDER — PHENYLEPHRINE 40 MCG/ML (10ML) SYRINGE FOR IV PUSH (FOR BLOOD PRESSURE SUPPORT)
80.0000 ug | PREFILLED_SYRINGE | INTRAVENOUS | Status: DC | PRN
  Administered 2019-10-22: 80 ug via INTRAVENOUS
  Filled 2019-10-21 (×2): qty 10

## 2019-10-21 NOTE — Progress Notes (Signed)
Inpatient Diabetes Program Recommendations  Diabetes Treatment Program Recommendations  ADA Standards of Care  Diabetes in Pregnancy Target Glucose Ranges:  Fasting: 60 - 90 mg/dL Preprandial: 60 - 105 mg/dL 1 hr postprandial: Less than 140mg /dL (from first bite of meal) 2 hr postprandial: Less than 120 mg/dL (from first bite of meal)   Results for MARVINE, SHIPE (MRN [id number]) as of 10/21/2019 14:03  Ref. Range 10/21/2019 07:56 10/21/2019 10:53 10/21/2019 12:51 10/21/2019 13:04 10/21/2019 13:24 10/21/2019 13:42  Glucose-Capillary Latest Ref Range: 70 - 99 mg/dL 90 135 (H)  Novolog 8 units@10 :01 for breakfast coverage (pt only ate bites of cereal) 63 (L) 53 (L) 64 (L) 83  Results for SOPHIAROSE, REMEDIES (MRN [id number]) as of 10/21/2019 14:03  Ref. Range 10/20/2019 05:36 10/20/2019 11:09 10/20/2019 13:14 10/20/2019 17:38 10/20/2019 18:48 10/20/2019 19:42 10/20/2019 21:59  Glucose-Capillary Latest Ref Range: 70 - 99 mg/dL 76    NPH 13 units  89 158 (H)  Novolog 8 units 155 (H) 98  Novolog 1 unit  79   NPH 9 units    Review of Glycemic Control  Diabetes history:Type 1 DM Outpatient Diabetes medications:NPH 15 units BID, Novolog 5-15 units TID Current orders for Inpatient glycemic control:NPH9unitsQHS,  Novolog 0-14 units Q4H  NOTE: Received page from L&D regarding hypoglycemia. Spoke with Mickel Baas, RN who reports that patient's glucose was down to 63 mg/dl at 12:51 and 53 mg/dl at 13:04. Informed RN that I secure chatted with Dr. Simona Huh earlier today and since labor may be 24 hours or more, MD preferred to continue IV insulin for now and would change to IV insulin via [employer] if needed.  Discussed that patient received Novolog 8 units this morning at 10:01 for breakfast coverage and likely did not eat much which has lead to hypoglycemia. RN reports that patient reported only taking a couple of bites of cereal this morning. Patient is now ordered clear liquid diet and has been given juice for  hypoglycemia treamtnet.  RN states that Dr. Simona Huh has ordered D5LR and asked about rate it should be ran at. Recommended running D5LR at 50 ml/hr and to ask MD to receive order if they are agreeable. Explained that since patient has Type 1 DM and did not receive any NPH insulin this morning, her glucose will likely increase throughout the day once Novolog (given at 10:01) wears off. Patient has Novolog 0-14 units Q4H ordered for correction currently and NPH 9 units ordered  BID to start at bedtime tonight. Will continue to follow along and make further recommendations if needed.  Thanks, Barnie Alderman, RN, MSN, CDE Diabetes Coordinator Inpatient Diabetes Program [phone] (Team Pager from 8am to 5pm)

## 2019-10-21 NOTE — Progress Notes (Signed)
Inpatient Diabetes Program Recommendations  Diabetes Treatment Program Recommendations  ADA Standards of Care  Diabetes in Pregnancy Target Glucose Ranges:  Fasting: 60 - 90 mg/dL Preprandial: 60 - 105 mg/dL 1 hr postprandial: Less than 140mg /dL (from first bite of meal) 2 hr postprandial: Less than 120 mg/dL (from first bite of meal)  Results for THULA, KURCZEWSKI (MRN [id number]) as of 10/21/2019 07:41  Ref. Range 10/21/2019 05:36  Glucose Latest Ref Range: 70 - 99 mg/dL 99   Results for CARISHA, JESSE (MRN [id number]) as of 10/21/2019 07:41  Ref. Range 10/20/2019 05:36 10/20/2019 11:09 10/20/2019 13:14 10/20/2019 17:38 10/20/2019 18:48 10/20/2019 19:42 10/20/2019 21:59  Glucose-Capillary Latest Ref Range: 70 - 99 mg/dL 76    NPH 13 units 89 158 (H)  Novolog 8 units 155 (H) 98  Novolog 1 unit 79   NPH 9 units   Review of Glycemic Control  Diabetes history:Type 1 DM Outpatient Diabetes medications:NPH 15 units BID, Novolog 5-15 units TID Current orders for Inpatient glycemic control:NPH9units QHS, Novolog8units TID with meal, Novolog 0-14 units TID (2 hour post prandial)  Inpatient Diabetes Program Recommendations:    Insulin-Basal: Patient received NPH 13 units yesterday morning and NPH 9 units last night at bedtime. Noted NPH is only ordered for bedtime. Patient will need morning dose of NPH as well. Please consider ordering NPH 10 units QAM to start this morning.  Thanks, Barnie Alderman, RN, MSN, CDE Diabetes Coordinator Inpatient Diabetes Program [phone] (Team Pager from 8am to 5pm)

## 2019-10-21 NOTE — Progress Notes (Addendum)
Hypoglycemic Event  CBG: 63  Treatment: 4 oz juice/soda  Symptoms: Shaky   Comments/MD notified:Dr Varnado, No new orders.   Follow-up CBG: Time: 1305 CBG Result:53  Treatment : 8 oz juice/soda  Comments/MD notified:  Dr Simona Huh, Start D5LR per  diabetes coordinator rate  Additional Follow-up CBG:   Time: 1324       CBG Result: 64  Treatment : 8oz juice  Comments/MD notified:Dr Varndo, No new orders at this time. Coming to see pt shortly.  Additional Follow-Up CBG: 1343   CBG: 83  Dr Simona Huh at bedside now (1344)   Possible Reasons for Event: Vomiting and Inadequate meal intake  Denelle Capurro, Narda Amber

## 2019-10-21 NOTE — Anesthesia Preprocedure Evaluation (Signed)
Anesthesia Evaluation  Patient identified by MRN, date of birth, ID band Patient awake    Reviewed: Allergy & Precautions, NPO status , Patient's Chart, lab work & pertinent test results  Airway Mallampati: II       Dental no notable dental hx. (+) Teeth Intact   Pulmonary former smoker,     + decreased breath sounds      Cardiovascular hypertension, Pt. on medications Normal cardiovascular exam Rhythm:Regular Rate:Normal     Neuro/Psych PSYCHIATRIC DISORDERS Anxiety Depression    GI/Hepatic negative GI ROS, Neg liver ROS,   Endo/Other  diabetes, Type 1, Insulin Dependent  Renal/GU Renal InsufficiencyRenal diseaseCKD III     Musculoskeletal   Abdominal (+) + obese,   Peds  Hematology  (+) Blood dyscrasia, anemia ,   Anesthesia Other Findings   Reproductive/Obstetrics                             Anesthesia Physical Anesthesia Plan  ASA: III  Anesthesia Plan: Epidural   Post-op Pain Management:    Induction:   PONV Risk Score and Plan:   Airway Management Planned:   Additional Equipment:   Intra-op Plan:   Post-operative Plan:   Informed Consent:   Plan Discussed with:   Anesthesia Plan Comments:         Anesthesia Quick Evaluation

## 2019-10-21 NOTE — Progress Notes (Signed)
Grice,CNM on unit and notified of IV infusion with LR/Pitocin/Magnesium paused awaiting IV team consult.

## 2019-10-21 NOTE — Progress Notes (Signed)
Kathy Lewis is a 24 y.o. G2P0010 at [redacted]w[redacted]d   Subjective: Pt without complaint.  Vision is improving, she can see my face clearly up close.  Denies SOB, headache has resolved.  Pt denies pain.  Planning for an epidural.  Objective: BP 133/82   Pulse 100   Temp 98.3 F (36.8 C) (Oral)   Resp 18   Ht 5' (1.524 m)   Wt 86.2 kg   LMP 03/14/2019 (Exact Date)   SpO2 97%   BMI 37.13 kg/m  I/O last 3 completed shifts: In: 489.8 [P.O.:480; I.V.:9.8] Out: 1800 [Urine:1800] Total I/O In: 1359.1 [P.O.:540; I.V.:819.1] Out: 600 [Urine:550; Emesis/NG output:50]  I/O last 3 completed shifts: In: 489.8 [P.O.:480; I.V.:9.8] Out: 1800 [Urine:1800]  UOP +443 since on L&D  Gen:  NAD, A&O x3 Lungs: Crackles at left base.  O/w clear to oscultation Abd:  No abdominal pain FHT:  FHR: 140s bpm, variability: moderate,  accelerations:  Present,  decelerations:  Absent Baseline now 130s UC:   2-4 min  SVE:   Dilation: 3 Effacement (%): 70 Station: Ballotable Exam by:: Junie Panning, RN Cervix deferred.  Labs:  CBC Latest Ref Rng & Units 10/21/2019 10/21/2019 10/20/2019  WBC 4.0 - 10.5 K/uL 9.2 8.9 7.1  Hemoglobin 12.0 - 15.0 g/dL 9.1(L) 8.7(L) 7.0(L)  Hematocrit 36.0 - 46.0 % 26.9(L) 25.1(L) 19.9(L)  Platelets 150 - 400 K/uL 146(L) 138(L) 112(L)   CMP Latest Ref Rng & Units 10/21/2019 10/21/2019 10/20/2019  Glucose 70 - 99 mg/dL 160(H) 99 78  BUN 6 - 20 mg/dL 16 18 20   Creatinine 0.44 - 1.00 mg/dL 1.68(H) 1.59(H) 1.67(H)  Sodium 135 - 145 mmol/L 134(L) 137 137  Potassium 3.5 - 5.1 mmol/L 4.0 3.9 4.1  Chloride 98 - 111 mmol/L 106 108 109  CO2 22 - 32 mmol/L 21(L) 20(L) 20(L)  Calcium 8.9 - 10.3 mg/dL 7.7(L) 7.8(L) 7.7(L)  Total Protein 6.5 - 8.1 g/dL 4.9(L) 4.6(L) 4.1(L)  Total Bilirubin 0.3 - 1.2 mg/dL 0.3 0.9 0.5  Alkaline Phos 38 - 126 U/L 64 60 54  AST 15 - 41 U/L 18 18 18   ALT 0 - 44 U/L 11 13 13    Magnesium 5.2   Assessment / Plan: IUP @ 30 4/7 weeks IOL due to worsening Preeclampsia  with severe features (visual changes). Visual changes improving.   Crackles.  Portable CXR ordered.  5 lines running.  Total fluids 125 ml.  (Magnesium, LR, Pitocin, D5LR, Insulin).  Pt has adequate UOP.  If pulmonary  edema, consider holding IV Insulin and D5LR and Diuretic.  Magnesium level is adequate now.  Repeat it in 6 hours to  make sure it does not go too high.  Labor: S/p Cytotec x 1 and progressed.  Pitocin started at 1 mU to increase by 2 mUs.   DM, Type I.    Blood sugar is now regulated well.  Preeclampsia:  on magnesium sulfate, no signs or symptoms of toxicity and BP well controlled with Procardia XL 90 mg.     Cont to monitor BP closely.  CKD Stage 3- Adequate UOP  Strict I/Os.   Total fluids running at 125 ml.   Normal magnesium level.  Fetal Wellbeing:  Category I Pain Control:  Epidural I/D:  GBS negative. Anticipated MOD:  NSVD  [name] 10/21/2019, 6:16 PM

## 2019-10-21 NOTE — Progress Notes (Addendum)
IV site in left AC swollen. IV infusion in left AC stopped @ 2015. IV team consult ordered.

## 2019-10-21 NOTE — Progress Notes (Signed)
Kathy Lewis is a 24 y.o. G2P0010 at [redacted]w[redacted]d   Subjective: Pt is feeling much better.  Vision is still blurry.  No headache after arriving to L&D.  Pt with a hypotensive episode when Magnesium bolus was started.  She also had hypoglycemia which was treated with po juice and D5 eventually started.  Objective: BP 136/86   Pulse 99   Temp 98.3 F (36.8 C) (Oral)   Resp 18   Ht 5' (1.524 m)   Wt 86.2 kg   LMP 03/14/2019 (Exact Date)   SpO2 95%   BMI 37.13 kg/m  I/O last 3 completed shifts: In: 489.8 [P.O.:480; I.V.:9.8] Out: 1800 [Urine:1800] Total I/O In: 0  Out: 100 [Urine:50; Emesis/NG output:50]  FHT:  FHR: 140s bpm, variability: moderate,  accelerations:  Present,  decelerations:  Absent UC:   Infrequent.  SVE:   Dilation: 1 Effacement (%): Thick Station: Ballotable Exam by:: Sharyn Lull, RN  Cervix deferred.  Labs:  CBC Latest Ref Rng & Units 10/21/2019 10/20/2019 10/19/2019  WBC 4.0 - 10.5 K/uL 8.9 7.1 9.3  Hemoglobin 12.0 - 15.0 g/dL 8.7(L) 7.0(L) 8.2(L)  Hematocrit 36.0 - 46.0 % 25.1(L) 19.9(L) 24.1(L)  Platelets 150 - 400 K/uL 138(L) 112(L) 127(L)   CMP Latest Ref Rng & Units 10/21/2019 10/20/2019 10/19/2019  Glucose 70 - 99 mg/dL 99 78 50(L)  BUN 6 - 20 mg/dL 18 20 20   Creatinine 0.44 - 1.00 mg/dL 1.59(H) 1.67(H) 1.58(H)  Sodium 135 - 145 mmol/L 137 137 138  Potassium 3.5 - 5.1 mmol/L 3.9 4.1 4.1  Chloride 98 - 111 mmol/L 108 109 109  CO2 22 - 32 mmol/L 20(L) 20(L) 23  Calcium 8.9 - 10.3 mg/dL 7.8(L) 7.7(L) 7.9(L)  Total Protein 6.5 - 8.1 g/dL 4.6(L) 4.1(L) 4.7(L)  Total Bilirubin 0.3 - 1.2 mg/dL 0.9 0.5 0.7  Alkaline Phos 38 - 126 U/L 60 54 62  AST 15 - 41 U/L 18 18 21   ALT 0 - 44 U/L 13 13 14      Assessment / Plan: IUP @ 30 4/7 weeks IOL due to worsening Preeclampsia with severe features (visual changes).  Labor: Cytotec placed for cervical ripening.   DM, Type I.    Will proceed with IV insulin protocol.  Hypoglycemia may be due to Novolog intake  with minimal po intake.  Consult Diabetic educator prn.  Appreciate recommendation.  Preeclampsia:  on magnesium sulfate, no signs or symptoms of toxicity and BP well controlled with Procardia XL 90 mg.   It's unclear why hypotension happened with bolus.  Might be due to onset of Procardia and Magnesium simultaneously.  Cont to   monitor BP closely. CKD Stage 3  Strict I/Os.   Total fluids running at 100 ml.   Check Magnesium levels with Preeclampsia labs 6 hours after load to avoid Mag toxicity in that it is cleared renally. Fetal Wellbeing:  Category I Pain Control:  Unsure at this time. I/D:  GBS negative. Anticipated MOD:  NSVD  [name] 10/21/2019, 2:14 PM

## 2019-10-21 NOTE — Anesthesia Procedure Notes (Signed)
Epidural Patient location during procedure: OB Start time: 10/21/2019 10:12 PM End time: 10/21/2019 10:14 PM  Staffing Anesthesiologist: Lyn Hollingshead, MD  Preanesthetic Checklist Completed: patient identified, IV checked, site marked, risks and benefits discussed, surgical consent, monitors and equipment checked, pre-op evaluation and timeout performed  Epidural Patient position: sitting Prep: DuraPrep and site prepped and draped Patient monitoring: continuous pulse ox and blood pressure Approach: midline Location: L3-L4 Injection technique: LOR air  Needle:  Needle type: Tuohy  Needle gauge: 17 G Needle length: 9 cm and 9 Needle insertion depth: 6 cm Catheter type: closed end flexible Catheter size: 19 Gauge Catheter at skin depth: 11 cm Test dose: negative and Other  Assessment Events: blood not aspirated, injection not painful, no injection resistance, no paresthesia and negative IV test  Additional Notes Reason for block:procedure for pain

## 2019-10-21 NOTE — Progress Notes (Signed)
Per IV team assessment, IV in left hand and left AC both patent. Due to swelling in left arm, new IV placed by IV team RN in right arm. IV in left hand continues infusing, IV in left AC saline locked.

## 2019-10-21 NOTE — Progress Notes (Signed)
Attempted follow up with patient after receiving report that pt has had limited visitors and experiencing a lot of stress.  Pt was sleeping when I arrived.  Pt's RN, Sharyn Lull, stated that pt had been through a lot medically today and that her medication made her tired.  She reports she is coping fairly well and is glad that FOB will be visiting later today.  Will continue to follow, but please page as further needs arise.  Donald Prose. Elyn Peers, M.Div. [facility] Chaplain Pager [phone] Office [phone]

## 2019-10-21 NOTE — Progress Notes (Signed)
Pt states her blurry vision has not worsened but it is still present.  She currently has a headache that started this morning.  Pt denies s/sxs of HSV outbreak.  She normally gets her outbreaks on her upper buttocks.  BP 133/85 (BP Location: Left Arm)   Pulse (!) 102   Temp 98.2 F (36.8 C) (Oral)   Resp 20   Ht 5' (1.524 m)   Wt 86.2 kg   LMP 03/14/2019 (Exact Date)   SpO2 100%   BMI 37.13 kg/m   Gen:  NAD Abd:  No upper abdominal pain Cervix: 1/th/posterior, presenting part not palpated Vulva:  No obvious lesions. Ultrasound:  Vertex   GBS negative.  Proceed with IOL.  Start Magnesium for maternal seizure prophylaxis and fetal neuroprotection.   Discussed s/sxs of pulmonary edema.  Plan for strict I/Os.  Modify Magnesium and LR infusion prn per UOP.  Pt desires trial of labor without epidural b/c she has a h/o back spasms.  Encouraged pt to discuss this with CRNA b/c I do not feel that would be a contraindication for delivery.

## 2019-10-21 NOTE — Progress Notes (Signed)
Kathy Lewis is a 24 y.o. G2P0010 at [redacted]w[redacted]d IOL for severe pre-eclampsia and a pregnancy complicated by Type 1 DM and stage 3 renal failure. Bedside care provided by CNM with management provided by Dr. Alesia Richards. Assessment performed and plan of care discussed. IV site lost to infiltration, IV team present, lack of options for replacement, may require anesthesia for venous access.   Subjective:  Pt reports feeling good with an improvement in her vision, currently wearing corrective lenses. Denies HA and RUQ pain. Denies SOB, but endorses nasal congestion and request nasal spray. Planning epidural for pain management, answered questions on when to have it placed. Anesthesia consulted completed earlier. Seizure precautions in place.   Objective: BP 139/81   Pulse 94   Temp 98.3 F (36.8 C) (Oral)   Resp 17   Ht 5' (1.524 m)   Wt 86.2 kg   LMP 03/14/2019 (Exact Date)   SpO2 97%   BMI 37.13 kg/m  I/O last 3 completed shifts: In: [id number] [P.O.:660; I.V.:955] Out: 1600 [Urine:1550; Emesis/NG output:50] No intake/output data recorded.  Gen: AAO x3, cooperative, no distress Cardia: RRR, no edema Resp:Crackles in left base, clear in right upper, lower and left upper Extremities: no edema, negative for pain, cords, tenderness Neuro: 1+ reflexes, negative clonus  FHT:  FHR: 125 bpm, variability: moderate,  accelerations:  Present,  decelerations:  absent UC:   2-5 SVE:   Dilation: 3 Effacement (%): 70 Station: Ballotable Exam by:: Erin, RN    Intake/Output Summary (Last 24 hours) at 10/21/2019 2200 Last data filed at 10/21/2019 2100 Gross per 24 hour  Intake 1801.75 ml  Output 800 ml  Net 1001.75 ml    Labs: Lab Results  Component Value Date   WBC 9.2 10/21/2019   HGB 9.1 (L) 10/21/2019   HCT 26.9 (L) 10/21/2019   MCV 92.1 10/21/2019   PLT 146 (L) 10/21/2019   CBG (last 3)  Recent Labs    10/21/19 1907 10/21/19 2004 10/21/19 2117  GLUCAP 115* 138* 124*     Assessment /  Plan: 24 y.o. G2P0010 at [redacted]w[redacted]d   IOL due to worsening Preeclampsia with severe features (visual changes). Visual changes improving.   Crackles in left base      -CXR results pending Mainline IV site infiltrated.Total fluids on hold until IV site secured.     Labor: S/p Cytotec x 1 and progressed. Will restart Pitocin w/ new IV site  DM, Type I.       -glucose stabilizer effective for manangment  Preeclampsia: normotensive, on magnesium sulfate, no signs or symptoms of toxicity. Will discuss sched Procardia and Aldoment administration w/ Dr. Alesia Richards  CKD Stage 3- Adequate UOP             Strict I/Os.              Total fluids running at 125 ml.              Normal magnesium level.  Fetal Wellbeing:  Category I Pain Control:  Epidural I/D:  GBS negative. Anticipated MOD:  NSVD  Plan reviewed w/ Dr. Effie Shy MSN, CNM 10/21/2019, 9:31 PM

## 2019-10-21 NOTE — Progress Notes (Signed)
Nephrology Progress Note:   Patient ID: Kathy Lewis, female   DOB: 1996/05/03, 24 y.o.   MRN: [id number]  S:  Blood pressure improving. Procardia inc to 90 mg daily yesterday.  got a unit of PRBC's yesterday.  States that her vision has been blurry since yesterday - not worse than yesterday but persistent.  Agitated yesterday and wanted to leave AMA per charting. Spoke with OB and they are planning for delivery today.    Review of systems Denies n/v Denies shortness of breath or chest pain  Denies dizziness  Blurry vision as above  O:BP (!) 139/93 (BP Location: Left Arm)   Pulse (!) 102   Temp 98.2 F (36.8 C) (Oral)   Resp 18   Ht 5' (1.524 m)   Wt 86.2 kg   LMP 03/14/2019 (Exact Date)   SpO2 95%   BMI 37.13 kg/m   Intake/Output Summary (Last 24 hours) at 10/21/2019 0742 Last data filed at 10/20/2019 1508 Gross per 24 hour  Intake 9.83 ml  Output 800 ml  Net -790.17 ml   Intake/Output: I/O last 3 completed shifts: In: 489.8 [P.O.:480; I.V.:9.8] Out: 1800 [Urine:1800]  Intake/Output this shift:  No intake/output data recorded. Weight change: 0.04 kg   Physical exam:  Gen: adult female NAD  CVS: S1S2 tachy no rub Resp: cta unlabored   Abd: gravid uterus, nontender Ext: 1-2+ lower extremity edema bilaterally Psych normal mood and affect.  Neuro - alert and oriented x 3; follows commands and provides history  Recent Labs  Lab 10/16/19 0501 10/17/19 0558 10/18/19 0534 10/19/19 0806 10/19/19 1718 10/20/19 0622 10/21/19 0536  NA 134* 136 137 136 138 137 137  K 4.9 4.6 4.1 4.6 4.1 4.1 3.9  CL 105 108 109 108 109 109 108  CO2 19* 21* 21* 22 23 20* 20*  GLUCOSE 304* 141* 73 169* 50* 78 99  BUN 15 20 17 20 20 20 18   CREATININE 1.81* 1.83* 1.83* 1.55* 1.58* 1.67* 1.59*  ALBUMIN 1.7* 1.5* 1.5* 1.4* 1.6* 1.3* 1.6*  CALCIUM 8.0* 8.0* 7.9* 7.7* 7.9* 7.7* 7.8*  AST 18 19 19 19 21 18 18   ALT 15 13 14 15 14 13 13    Liver Function Tests: Recent Labs  Lab  10/19/19 1718 10/20/19 0622 10/21/19 0536  AST 21 18 18   ALT 14 13 13   ALKPHOS 62 54 60  BILITOT 0.7 0.5 0.9  PROT 4.7* 4.1* 4.6*  ALBUMIN 1.6* 1.3* 1.6*   CBC: Recent Labs  Lab 10/18/19 0534 10/18/19 0534 10/19/19 0806 10/19/19 0806 10/19/19 1718 10/20/19 0622 10/21/19 0536  WBC 8.5   < > 7.9   < > 9.3 7.1 8.9  NEUTROABS  --   --   --   --  6.3  --   --   HGB 7.8*   < > 7.6*   < > 8.2* 7.0* 8.7*  HCT 23.3*   < > 22.3*   < > 24.1* 19.9* 25.1*  MCV 94.3  --  92.5  --  93.1 90.9 90.6  PLT 125*   < > 117*   < > 127* 112* 138*   < > = values in this interval not displayed.   Cardiac Enzymes: No results for input(s): CKTOTAL, CKMB, CKMBINDEX, TROPONINI in the last 168 hours. CBG: Recent Labs  Lab 10/20/19 1314 10/20/19 1738 10/20/19 1848 10/20/19 1942 10/20/19 2159  GLUCAP 89 158* 155* 98 79    Iron Studies:  No results for input(s): IRON, TIBC,  TRANSFERRIN, FERRITIN in the last 72 hours. Studies/Results: [place] MFM OB Transvaginal  Result Date: 10/20/2019 ----------------------------------------------------------------------  OBSTETRICS REPORT                       (Signed Final 10/20/2019 10:41 am) ---------------------------------------------------------------------- Patient Info  ID #:       [id number]                          D.O.B.:  01/10/1996 (23 yrs)  Name:       Kathy Lewis                   Visit Date: 10/20/2019 09:22 am ---------------------------------------------------------------------- Performed By  Performed By:     Novella Rob        Ref. Address:     [address]                                                             [place]  Attending:        Tama High MD        Secondary Phy.:   [facility]                                                             Care  Referred By:       Estill Bamberg                 Location[facility]                               [facility] ---------------------------------------------------------------------- Orders   #  Description                          Code         Ordered By   1  [place] MFM FETAL BPP WO NON              [phone]  EVELYN VARNADO      STRESS   2  [place] MFM OB TRANSVAGINAL               [id number]      [facility] VARNADO  ----------------------------------------------------------------------   #  Order #                    Accession #                 Episode #   1  [id number]                  [id number]   2  [id number]                  [id number]                  [id number]  ---------------------------------------------------------------------- Indications   Pre-eclampsia                                  O14.90   Cervical shortening, third trimester           O26.873   Polyhydramnios, third trimester, antepartum    O40.3XX0   condition or complication, unspecified fetus   Encounter for cervical length                  Z36.86   Pre-existing diabetes, type 1, in pregnancy,   O24.013   third trimester   [redacted] weeks gestation of pregnancy                Z3A.30  ---------------------------------------------------------------------- Fetal Evaluation  Num Of Fetuses:         1  Fetal Heart Rate(bpm):  133  Cardiac Activity:       Observed  Presentation:           Cephalic  Placenta:               Posterior  P. Cord Insertion:      Previously Visualized  Amniotic Fluid  AFI FV:      Polyhydramnios  AFI Sum(cm)     %Tile       Largest Pocket(cm)  26.11           > 97        8.79  RUQ(cm)       RLQ(cm)       LUQ(cm)        LLQ(cm)  6.12          6.22          8.79           4.98 ---------------------------------------------------------------------- Biophysical Evaluation  Amniotic F.V:   Within normal limits       F. Tone:        Observed  F. Movement:    Observed                   Score:          6/8   F. Breathing:   Not Observed ---------------------------------------------------------------------- OB History  Gravidity:    2         Term:   0        Prem:   0        SAB:   1  TOP:  0       Ectopic:  0        Living: 0 ---------------------------------------------------------------------- Gestational Age  LMP:           31w 3d        Date:  03/14/19                 EDD:   12/19/19  Best:          Weston Settle 3d     Det. ByLoman Chroman         EDD:   12/26/19                                      (04/30/19) ---------------------------------------------------------------------- Cervix Uterus Adnexa  Cervix  Length:            0.4  cm.  See comments ---------------------------------------------------------------------- Impression  G2 P0.  Patient with type 1 diabetes with diabetic  nephropathy was admitted 5 days ago with diagnosis of  preeclampsia.  On ultrasound performed at admission, fetal  growth was appropriate for gestational age.  On today's ultrasound, polyhydramnios is seen (AFI= 26 cm).  Cephalic presentation.  Fetal breathing movements did not  meet the criteria of BPP.  I reviewed the NST which is  reactive.  BPP 6/8.  Because of the appearance of cervical shortening, we  performed transvaginal ultrasound to evaluate the cervix.  The cervical canal is dilated with a residual closed portion of  cervix measuring 4 millimeters.  Cervical shortening and polyhydramnios increase the risk of  preterm delivery. ---------------------------------------------------------------------- Recommendations  -Twice-weekly BPP (Mon-Thu). ----------------------------------------------------------------------                  Tama High, MD Electronically Signed Final Report   10/20/2019 10:41 am ----------------------------------------------------------------------  [place] MFM FETAL BPP WO NON STRESS  Result Date: 10/20/2019 ----------------------------------------------------------------------  OBSTETRICS REPORT                        (Signed Final 10/20/2019 10:41 am) ---------------------------------------------------------------------- Patient Info  ID #:       [id number]                          D.O.B.:  09/18/1995 (23 yrs)  Name:       Kathy Lewis                   Visit Date: 10/20/2019 09:22 am ---------------------------------------------------------------------- Performed By  Performed By:     Novella Rob        Ref. Address:     [place]                                                             [address]  [place]                                                             [id number]  Attending:        Tama High MD        Secondary Phy.:   [facility]                                                             Care  Referred By:      Estill Bamberg                 Location[facility]         [facility]                               Fetal Care ---------------------------------------------------------------------- Orders   #  Description                          Code         Ordered By   1  [place] MFM FETAL BPP WO NON              [id number]     EVELYN VARNADO      STRESS   2  [place] MFM OB TRANSVAGINAL               [id number]      EVELYN VARNADO  ----------------------------------------------------------------------   #  Order #                    Accession #                 Episode #   1  [id number]                  [id number]   2  [id number]                  [id number]                  [id number]  ---------------------------------------------------------------------- Indications   Pre-eclampsia                                  O14.90   Cervical shortening, third trimester           O26.873   Polyhydramnios, third trimester, antepartum    O40.3XX0   condition or complication, unspecified fetus   Encounter for cervical length                  Z36.86   Pre-existing diabetes, type 1,  in pregnancy,   O24.013   third trimester   [redacted] weeks gestation of pregnancy  Z3A.30  ---------------------------------------------------------------------- Fetal Evaluation  Num Of Fetuses:         1  Fetal Heart Rate(bpm):  133  Cardiac Activity:       Observed  Presentation:           Cephalic  Placenta:               Posterior  P. Cord Insertion:      Previously Visualized  Amniotic Fluid  AFI FV:      Polyhydramnios  AFI Sum(cm)     %Tile       Largest Pocket(cm)  26.11           > 97        8.79  RUQ(cm)       RLQ(cm)       LUQ(cm)        LLQ(cm)  6.12          6.22          8.79           4.98 ---------------------------------------------------------------------- Biophysical Evaluation  Amniotic F.V:   Within normal limits       F. Tone:        Observed  F. Movement:    Observed                   Score:          6/8  F. Breathing:   Not Observed ---------------------------------------------------------------------- OB History  Gravidity:    2         Term:   0        Prem:   0        SAB:   1  TOP:          0       Ectopic:  0        Living: 0 ---------------------------------------------------------------------- Gestational Age  LMP:           31w 3d        Date:  03/14/19                 EDD:   12/19/19  Best:          Weston Settle 3d     Det. ByLoman Chroman         EDD:   12/26/19                                      (04/30/19) ---------------------------------------------------------------------- Cervix Uterus Adnexa  Cervix  Length:            0.4  cm.  See comments ---------------------------------------------------------------------- Impression  G2 P0.  Patient with type 1 diabetes with diabetic  nephropathy was admitted 5 days ago with diagnosis of  preeclampsia.  On ultrasound performed at admission, fetal  growth was appropriate for gestational age.  On today's ultrasound, polyhydramnios is seen (AFI= 26 cm).  Cephalic presentation.  Fetal breathing movements did not  meet the criteria of  BPP.  I reviewed the NST which is  reactive.  BPP 6/8.  Because of the appearance of cervical shortening, we  performed transvaginal ultrasound to evaluate the cervix.  The cervical canal is dilated with a residual closed portion of  cervix measuring 4 millimeters.  Cervical shortening and polyhydramnios increase the risk of  preterm delivery. ---------------------------------------------------------------------- Recommendations  -Twice-weekly BPP (Mon-Thu). ----------------------------------------------------------------------  Tama High, MD Electronically Signed Final Report   10/20/2019 10:41 am ----------------------------------------------------------------------  . aspirin  81 mg Oral Daily  . docusate sodium  100 mg Oral Daily  . escitalopram  15 mg Oral Daily  . insulin aspart  0-14 Units Subcutaneous TID PC  . insulin aspart  8 Units Subcutaneous TID WC  . insulin NPH Human  9 Units Subcutaneous QHS  . iron polysaccharides  150 mg Oral Daily  . loratadine  10 mg Oral Daily  . methyldopa  250 mg Oral BID  . metoCLOPramide  10 mg Oral TID AC & HS  . metroNIDAZOLE  500 mg Oral Q12H  . NIFEdipine  90 mg Oral Daily  . oxymetazoline  1 spray Each Nare BID  . prenatal multivitamin  1 tablet Oral Q1200  . valACYclovir  500 mg Oral Daily    BMET    Component Value Date/Time   NA 137 10/21/2019 0536   K 3.9 10/21/2019 0536   CL 108 10/21/2019 0536   CO2 20 (L) 10/21/2019 0536   GLUCOSE 99 10/21/2019 0536   BUN 18 10/21/2019 0536   CREATININE 1.59 (H) 10/21/2019 0536   CALCIUM 7.8 (L) 10/21/2019 0536   GFRNONAA 45 (L) 10/21/2019 0536   GFRAA 52 (L) 10/21/2019 0536   CBC    Component Value Date/Time   WBC 8.9 10/21/2019 0536   RBC 2.77 (L) 10/21/2019 0536   HGB 8.7 (L) 10/21/2019 0536   HCT 25.1 (L) 10/21/2019 0536   PLT 138 (L) 10/21/2019 0536   MCV 90.6 10/21/2019 0536   MCH 31.4 10/21/2019 0536   MCHC 34.7 10/21/2019 0536   RDW 14.4 10/21/2019 0536    LYMPHSABS 2.2 10/19/2019 1718   MONOABS 0.6 10/19/2019 1718   EOSABS 0.1 10/19/2019 1718   BASOSABS 0.0 10/19/2019 1718    Assessment/Plan: 1. Preeclampsia- has nephrotic range proteinuria and worsening HTN.  Currently on procardia xl 60 mg with improvement.  Possible atypical HELLP per MFM.  1. On procardia XL 90 mg daily - (120 is considered max) 2. Asa 81 mg daily 3. Cr 1.59 4. Note thrombocytopenia - improving plt 138 on 2/23 5. Timing of delivery per OB/MFM - spoke with OB and they are planning on delivery today, 2/23.  2. HTN: Increased procardia to 90 mg daily with improvement. Can continue Methyldopa.  If BP rises again would add labetalol 100 mg BID. Discussed importance of low salt diet. 3. AKI/CKD stage 3- slight bump in Scr likely due to combination of preeclampsia as well as significant anemia.   1. Scr 1.59 2. Continue to follow UOP and Scr 4. Type 1 DM- poorly controlled prior to pregnancy with improvement 5. CKD stage 3 - due to diabetic nephropathy.  Stressed importance of diabetic control with goal Hgb A1c <7%, use of an ACE or ARB, however unable to use during pregnancy.  Also discussed the need to avoid NSAIDs/Cox-II I"s.  1. She has already established care at our office with Dr. Carolin Sicks.  6. Anemia- s/p blood transfusion.   1. PRBC's given on 2/22.  Hb 8.7 on 2/23 2. Continue with oral iron   3. S/p IV feraheme 510 mg x 1 dose on 2/21 as per discussion with Dr. Mancel Bale  4. S/p blood transfusion prior to admission.   5. May require ESA although it will take weeks to take effect  6. Transfuse prn   Claudia Desanctis, MD  10/21/2019 7:56 AM

## 2019-10-21 NOTE — Progress Notes (Signed)
Called by RN that pt wants to leave AMA. To room for discussion with pt. Discussed risks involved with AMA. Pt notes that she feels "restless and her anxiety is getting the best of her". Offered to increase her ambien to aid in rest. Pt agrees. Pt notes she "doesn't understand why she is here when she feels fine". Explained to pt the severity of her comorbidities and the course severe preeclampsia can take. Informed pt that a plan for timing of delivery would be discussed further today. Pt agreeable to continued care.

## 2019-10-22 ENCOUNTER — Encounter (HOSPITAL_COMMUNITY): Payer: Self-pay | Admitting: Obstetrics and Gynecology

## 2019-10-22 ENCOUNTER — Inpatient Hospital Stay (HOSPITAL_COMMUNITY): Payer: Managed Care, Other (non HMO) | Admitting: Certified Registered Nurse Anesthetist

## 2019-10-22 ENCOUNTER — Encounter (HOSPITAL_COMMUNITY)
Admission: AD | Disposition: A | Discharge status: Home / Self Care | Payer: Self-pay | Source: Home / Self Care | Attending: Obstetrics and Gynecology

## 2019-10-22 ENCOUNTER — Inpatient Hospital Stay (HOSPITAL_COMMUNITY): Payer: Managed Care, Other (non HMO)

## 2019-10-22 DIAGNOSIS — I517 Cardiomegaly: Secondary | ICD-10-CM

## 2019-10-22 DIAGNOSIS — I313 Pericardial effusion (noninflammatory): Secondary | ICD-10-CM

## 2019-10-22 HISTORY — PX: DILATION AND EVACUATION: SHX1459

## 2019-10-22 LAB — COMPREHENSIVE METABOLIC PANEL
ALT: 13 U/L (ref 0–44)
ALT: 12 U/L (ref 0–44)
ALT: 13 U/L (ref 0–44)
AST: 20 U/L (ref 15–41)
AST: 23 U/L (ref 15–41)
AST: 21 U/L (ref 15–41)
Albumin: 1.6 g/dL — ABNORMAL LOW (ref 3.5–5.0)
Albumin: 1.6 g/dL — ABNORMAL LOW (ref 3.5–5.0)
Albumin: 1.4 g/dL — ABNORMAL LOW (ref 3.5–5.0)
Alkaline Phosphatase: 62 U/L (ref 38–126)
Alkaline Phosphatase: 72 U/L (ref 38–126)
Alkaline Phosphatase: 58 U/L (ref 38–126)
Anion gap: 8 (ref 5–15)
Anion gap: 9 (ref 5–15)
Anion gap: 7 (ref 5–15)
BUN: 16 mg/dL (ref 6–20)
BUN: 14 mg/dL (ref 6–20)
BUN: 15 mg/dL (ref 6–20)
CO2: 21 mmol/L — ABNORMAL LOW (ref 22–32)
CO2: 22 mmol/L (ref 22–32)
CO2: 22 mmol/L (ref 22–32)
Calcium: 7.3 mg/dL — ABNORMAL LOW (ref 8.9–10.3)
Calcium: 7.3 mg/dL — ABNORMAL LOW (ref 8.9–10.3)
Calcium: 6.9 mg/dL — ABNORMAL LOW (ref 8.9–10.3)
Chloride: 105 mmol/L (ref 98–111)
Chloride: 104 mmol/L (ref 98–111)
Chloride: 107 mmol/L (ref 98–111)
Creatinine, Ser: 1.75 mg/dL — ABNORMAL HIGH (ref 0.44–1.00)
Creatinine, Ser: 1.9 mg/dL — ABNORMAL HIGH (ref 0.44–1.00)
Creatinine, Ser: 1.71 mg/dL — ABNORMAL HIGH (ref 0.44–1.00)
GFR calc Af Amer: 47 mL/min — ABNORMAL LOW (ref >60)
GFR calc Af Amer: 42 mL/min — ABNORMAL LOW (ref >60)
GFR calc Af Amer: 48 mL/min — ABNORMAL LOW (ref >60)
GFR calc non Af Amer: 40 mL/min — ABNORMAL LOW (ref >60)
GFR calc non Af Amer: 37 mL/min — ABNORMAL LOW (ref >60)
GFR calc non Af Amer: 41 mL/min — ABNORMAL LOW (ref >60)
Glucose, Bld: 165 mg/dL — ABNORMAL HIGH (ref 70–99)
Glucose, Bld: 103 mg/dL — ABNORMAL HIGH (ref 70–99)
Glucose, Bld: 111 mg/dL — ABNORMAL HIGH (ref 70–99)
Potassium: 3.9 mmol/L (ref 3.5–5.1)
Potassium: 3.9 mmol/L (ref 3.5–5.1)
Potassium: 4.1 mmol/L (ref 3.5–5.1)
Sodium: 134 mmol/L — ABNORMAL LOW (ref 135–145)
Sodium: 135 mmol/L (ref 135–145)
Sodium: 136 mmol/L (ref 135–145)
Total Bilirubin: 0.7 mg/dL (ref 0.3–1.2)
Total Bilirubin: 0.5 mg/dL (ref 0.3–1.2)
Total Bilirubin: 0.5 mg/dL (ref 0.3–1.2)
Total Protein: 4.9 g/dL — ABNORMAL LOW (ref 6.5–8.1)
Total Protein: 4.8 g/dL — ABNORMAL LOW (ref 6.5–8.1)
Total Protein: 4 g/dL — ABNORMAL LOW (ref 6.5–8.1)

## 2019-10-22 LAB — CBC
HCT: 27.3 % — ABNORMAL LOW (ref 36.0–46.0)
HCT: 27.8 % — ABNORMAL LOW (ref 36.0–46.0)
Hemoglobin: 9.1 g/dL — ABNORMAL LOW (ref 12.0–15.0)
Hemoglobin: 9.3 g/dL — ABNORMAL LOW (ref 12.0–15.0)
MCH: 31 pg (ref 26.0–34.0)
MCH: 30.9 pg (ref 26.0–34.0)
MCHC: 33.3 g/dL (ref 30.0–36.0)
MCHC: 33.5 g/dL (ref 30.0–36.0)
MCV: 92.9 fL (ref 80.0–100.0)
MCV: 92.4 fL (ref 80.0–100.0)
Platelets: 150 10*3/uL (ref 150–400)
Platelets: 151 10*3/uL (ref 150–400)
RBC: 2.94 MIL/uL — ABNORMAL LOW (ref 3.87–5.11)
RBC: 3.01 MIL/uL — ABNORMAL LOW (ref 3.87–5.11)
RDW: 14.4 % (ref 11.5–15.5)
RDW: 14.2 % (ref 11.5–15.5)
WBC: 9.8 10*3/uL (ref 4.0–10.5)
WBC: 10.6 10*3/uL — ABNORMAL HIGH (ref 4.0–10.5)
nRBC: 0 % (ref 0.0–0.2)
nRBC: 0 % (ref 0.0–0.2)

## 2019-10-22 LAB — ECHOCARDIOGRAM COMPLETE
Height: 60 in
Weight: 3042 oz

## 2019-10-22 LAB — GLUCOSE, CAPILLARY
Glucose-Capillary: 100 mg/dL — ABNORMAL HIGH (ref 70–99)
Glucose-Capillary: 106 mg/dL — ABNORMAL HIGH (ref 70–99)
Glucose-Capillary: 108 mg/dL — ABNORMAL HIGH (ref 70–99)
Glucose-Capillary: 111 mg/dL — ABNORMAL HIGH (ref 70–99)
Glucose-Capillary: 119 mg/dL — ABNORMAL HIGH (ref 70–99)
Glucose-Capillary: 127 mg/dL — ABNORMAL HIGH (ref 70–99)
Glucose-Capillary: 138 mg/dL — ABNORMAL HIGH (ref 70–99)
Glucose-Capillary: 167 mg/dL — ABNORMAL HIGH (ref 70–99)
Glucose-Capillary: 87 mg/dL (ref 70–99)
Glucose-Capillary: 87 mg/dL (ref 70–99)
Glucose-Capillary: 89 mg/dL (ref 70–99)
Glucose-Capillary: 91 mg/dL (ref 70–99)
Glucose-Capillary: 94 mg/dL (ref 70–99)
Glucose-Capillary: 98 mg/dL (ref 70–99)
Glucose-Capillary: 98 mg/dL (ref 70–99)

## 2019-10-22 LAB — FIBRINOGEN
Fibrinogen: 584 mg/dL — ABNORMAL HIGH (ref 210–475)
Fibrinogen: 435 mg/dL (ref 210–475)

## 2019-10-22 LAB — PREPARE RBC (CROSSMATCH)

## 2019-10-22 LAB — PROTIME-INR
INR: 1 (ref 0.8–1.2)
INR: 1 (ref 0.8–1.2)
Prothrombin Time: 12.7 seconds (ref 11.4–15.2)
Prothrombin Time: 12.8 seconds (ref 11.4–15.2)

## 2019-10-22 LAB — MAGNESIUM
Magnesium: 5.1 mg/dL — ABNORMAL HIGH (ref 1.7–2.4)
Magnesium: 5.5 mg/dL — ABNORMAL HIGH (ref 1.7–2.4)
Magnesium: 6.1 mg/dL (ref 1.7–2.4)
Magnesium: 6.2 mg/dL (ref 1.7–2.4)

## 2019-10-22 LAB — APTT
aPTT: 27 seconds (ref 24–36)
aPTT: 28 seconds (ref 24–36)

## 2019-10-22 LAB — SAVE SMEAR(SSMR), FOR PROVIDER SLIDE REVIEW

## 2019-10-22 SURGERY — DILATION AND EVACUATION, UTERUS
Anesthesia: Epidural | Site: Vagina | Wound class: Clean Contaminated

## 2019-10-22 MED ORDER — NIFEDIPINE ER OSMOTIC RELEASE 30 MG PO TB24
60.0000 mg | ORAL_TABLET | Freq: Every day | ORAL | Status: DC
  Administered 2019-10-22 – 2019-10-23 (×2): 60 mg via ORAL
  Filled 2019-10-22: qty 1
  Filled 2019-10-22: qty 2

## 2019-10-22 MED ORDER — LIDOCAINE-EPINEPHRINE 2 %-1:100000 IJ SOLN
INTRAMUSCULAR | Status: DC | PRN
  Administered 2019-10-22: 1 mL via INTRADERMAL
  Administered 2019-10-22: 4 mL via INTRADERMAL
  Administered 2019-10-22: 5 mL via INTRADERMAL

## 2019-10-22 MED ORDER — SENNOSIDES-DOCUSATE SODIUM 8.6-50 MG PO TABS
2.0000 | ORAL_TABLET | ORAL | Status: DC
  Administered 2019-10-22 – 2019-10-26 (×4): 2 via ORAL
  Filled 2019-10-22 (×4): qty 2

## 2019-10-22 MED ORDER — TETANUS-DIPHTH-ACELL PERTUSSIS 5-2.5-18.5 LF-MCG/0.5 IM SUSP: 0.5000 mL | Freq: Once | INTRAMUSCULAR | Status: DC

## 2019-10-22 MED ORDER — MEPERIDINE HCL 25 MG/ML IJ SOLN
INTRAMUSCULAR | Status: DC | PRN
  Administered 2019-10-22 (×2): 12.5 mg via INTRAVENOUS

## 2019-10-22 MED ORDER — LACTATED RINGERS IV SOLN: INTRAVENOUS | Status: DC | PRN

## 2019-10-22 MED ORDER — CARBOPROST TROMETHAMINE 250 MCG/ML IM SOLN
INTRAMUSCULAR | Status: AC
  Filled 2019-10-22: qty 1

## 2019-10-22 MED ORDER — FUROSEMIDE 10 MG/ML IJ SOLN
20.0000 mg | Freq: Four times a day (QID) | INTRAMUSCULAR | Status: AC
  Administered 2019-10-22 – 2019-10-23 (×2): 20 mg via INTRAVENOUS
  Filled 2019-10-22 (×2): qty 2

## 2019-10-22 MED ORDER — MAGNESIUM HYDROXIDE 400 MG/5ML PO SUSP: 30.0000 mL | ORAL | Status: DC | PRN

## 2019-10-22 MED ORDER — ACETAMINOPHEN 325 MG PO TABS
650.0000 mg | ORAL_TABLET | Freq: Once | ORAL | Status: AC
  Administered 2019-10-22: 650 mg via ORAL
  Filled 2019-10-22: qty 2

## 2019-10-22 MED ORDER — MISOPROSTOL 200 MCG PO TABS
1000.0000 ug | ORAL_TABLET | Freq: Once | ORAL | Status: AC
  Administered 2019-10-22: 1000 ug via RECTAL

## 2019-10-22 MED ORDER — FENTANYL CITRATE (PF) 100 MCG/2ML IJ SOLN
INTRAMUSCULAR | Status: AC
  Filled 2019-10-22: qty 2

## 2019-10-22 MED ORDER — SIMETHICONE 80 MG PO CHEW
80.0000 mg | CHEWABLE_TABLET | ORAL | Status: DC | PRN
  Administered 2019-10-22: 80 mg via ORAL
  Filled 2019-10-22: qty 1

## 2019-10-22 MED ORDER — MISOPROSTOL 200 MCG PO TABS: 800.0000 ug | ORAL_TABLET | Freq: Once | ORAL | Status: DC | PRN

## 2019-10-22 MED ORDER — FENTANYL CITRATE (PF) 100 MCG/2ML IJ SOLN: 25.0000 ug | INTRAMUSCULAR | Status: DC | PRN

## 2019-10-22 MED ORDER — DIPHENHYDRAMINE HCL 50 MG/ML IJ SOLN
INTRAMUSCULAR | Status: AC
  Filled 2019-10-22: qty 1

## 2019-10-22 MED ORDER — SODIUM CHLORIDE 0.9% IV SOLUTION: Freq: Once | INTRAVENOUS | Status: AC

## 2019-10-22 MED ORDER — WITCH HAZEL-GLYCERIN EX PADS: 1.0000 "application " | MEDICATED_PAD | CUTANEOUS | Status: DC | PRN

## 2019-10-22 MED ORDER — SODIUM CHLORIDE 0.9 % IV SOLN
3.0000 g | Freq: Once | INTRAVENOUS | Status: AC
  Administered 2019-10-22: 3 g via INTRAVENOUS
  Filled 2019-10-22: qty 3

## 2019-10-22 MED ORDER — INSULIN DETEMIR 100 UNIT/ML ~~LOC~~ SOLN
12.0000 [IU] | Freq: Every day | SUBCUTANEOUS | Status: DC
  Administered 2019-10-22 – 2019-10-27 (×6): 12 [IU] via SUBCUTANEOUS
  Filled 2019-10-22 (×6): qty 0.12

## 2019-10-22 MED ORDER — CARBOPROST TROMETHAMINE 250 MCG/ML IM SOLN
250.0000 ug | Freq: Once | INTRAMUSCULAR | Status: AC
  Administered 2019-10-22: 250 ug via INTRAMUSCULAR

## 2019-10-22 MED ORDER — ACETAMINOPHEN 325 MG PO TABS
ORAL_TABLET | ORAL | Status: AC
  Filled 2019-10-22: qty 2

## 2019-10-22 MED ORDER — STERILE WATER FOR IRRIGATION IR SOLN
Status: DC | PRN
  Administered 2019-10-22: 1000 mL

## 2019-10-22 MED ORDER — MEPERIDINE HCL 25 MG/ML IJ SOLN
INTRAMUSCULAR | Status: AC
  Filled 2019-10-22: qty 1

## 2019-10-22 MED ORDER — FUROSEMIDE 10 MG/ML IJ SOLN
20.0000 mg | Freq: Once | INTRAMUSCULAR | Status: AC
  Administered 2019-10-22: 20 mg via INTRAVENOUS

## 2019-10-22 MED ORDER — PRENATAL MULTIVITAMIN CH
1.0000 | ORAL_TABLET | Freq: Every day | ORAL | Status: DC
  Administered 2019-10-23 – 2019-10-27 (×5): 1 via ORAL
  Filled 2019-10-22 (×5): qty 1

## 2019-10-22 MED ORDER — LIDOCAINE-EPINEPHRINE (PF) 2 %-1:200000 IJ SOLN
INTRAMUSCULAR | Status: AC
  Filled 2019-10-22: qty 10

## 2019-10-22 MED ORDER — ZOLPIDEM TARTRATE 5 MG PO TABS
5.0000 mg | ORAL_TABLET | Freq: Every evening | ORAL | Status: DC | PRN
  Administered 2019-10-24 – 2019-10-27 (×2): 5 mg via ORAL
  Filled 2019-10-22 (×2): qty 1

## 2019-10-22 MED ORDER — OXYCODONE HCL 5 MG PO TABS: 5.0000 mg | ORAL_TABLET | Freq: Once | ORAL | Status: DC | PRN

## 2019-10-22 MED ORDER — DIBUCAINE (PERIANAL) 1 % EX OINT: 1.0000 "application " | TOPICAL_OINTMENT | CUTANEOUS | Status: DC | PRN

## 2019-10-22 MED ORDER — MAGNESIUM SULFATE 40 GM/1000ML IV SOLN
1.0000 g/h | INTRAVENOUS | Status: DC
  Administered 2019-10-22: 1 g/h via INTRAVENOUS
  Filled 2019-10-22: qty 1000

## 2019-10-22 MED ORDER — ONDANSETRON HCL 4 MG/2ML IJ SOLN: 4.0000 mg | Freq: Once | INTRAMUSCULAR | Status: DC | PRN

## 2019-10-22 MED ORDER — INSULIN ASPART 100 UNIT/ML ~~LOC~~ SOLN: 0.0000 [IU] | SUBCUTANEOUS | Status: DC

## 2019-10-22 MED ORDER — SODIUM CHLORIDE 0.9% IV SOLUTION: Freq: Once | INTRAVENOUS | Status: DC

## 2019-10-22 MED ORDER — MISOPROSTOL 200 MCG PO TABS
ORAL_TABLET | ORAL | Status: AC
  Filled 2019-10-22: qty 5

## 2019-10-22 MED ORDER — OXYCODONE HCL 5 MG PO TABS: 10.0000 mg | ORAL_TABLET | ORAL | Status: DC | PRN

## 2019-10-22 MED ORDER — PHENYLEPHRINE 40 MCG/ML (10ML) SYRINGE FOR IV PUSH (FOR BLOOD PRESSURE SUPPORT): 120.0000 ug | PREFILLED_SYRINGE | Freq: Once | INTRAVENOUS | Status: AC

## 2019-10-22 MED ORDER — DIPHENOXYLATE-ATROPINE 2.5-0.025 MG PO TABS
2.0000 | ORAL_TABLET | Freq: Once | ORAL | Status: DC
  Filled 2019-10-22: qty 2

## 2019-10-22 MED ORDER — TRANEXAMIC ACID-NACL 1000-0.7 MG/100ML-% IV SOLN
1000.0000 mg | Freq: Once | INTRAVENOUS | Status: AC
  Administered 2019-10-22: 1000 mg via INTRAVENOUS

## 2019-10-22 MED ORDER — ACETAMINOPHEN 10 MG/ML IV SOLN: 1000.0000 mg | Freq: Once | INTRAVENOUS | Status: DC | PRN

## 2019-10-22 MED ORDER — ONDANSETRON HCL 4 MG/2ML IJ SOLN: 4.0000 mg | INTRAMUSCULAR | Status: DC | PRN

## 2019-10-22 MED ORDER — FENTANYL CITRATE (PF) 100 MCG/2ML IJ SOLN
INTRAMUSCULAR | Status: DC | PRN
  Administered 2019-10-22: 100 ug via EPIDURAL

## 2019-10-22 MED ORDER — ONDANSETRON HCL 4 MG PO TABS: 4.0000 mg | ORAL_TABLET | ORAL | Status: DC | PRN

## 2019-10-22 MED ORDER — OXYCODONE HCL 5 MG PO TABS
5.0000 mg | ORAL_TABLET | ORAL | Status: DC | PRN
  Administered 2019-10-23 – 2019-10-26 (×8): 5 mg via ORAL
  Filled 2019-10-22 (×9): qty 1

## 2019-10-22 MED ORDER — DIPHENHYDRAMINE HCL 25 MG PO CAPS: 25.0000 mg | ORAL_CAPSULE | Freq: Four times a day (QID) | ORAL | Status: DC | PRN

## 2019-10-22 MED ORDER — BENZOCAINE-MENTHOL 20-0.5 % EX AERO
1.0000 "application " | INHALATION_SPRAY | CUTANEOUS | Status: DC | PRN
  Administered 2019-10-23: 1 via TOPICAL
  Filled 2019-10-22: qty 56

## 2019-10-22 MED ORDER — DIPHENHYDRAMINE HCL 50 MG/ML IJ SOLN
12.5000 mg | Freq: Once | INTRAMUSCULAR | Status: AC
  Administered 2019-10-22: 12.5 mg via INTRAVENOUS

## 2019-10-22 MED ORDER — FUROSEMIDE 10 MG/ML IJ SOLN: 20.0000 mg | Freq: Once | INTRAMUSCULAR | Status: DC

## 2019-10-22 MED ORDER — COCONUT OIL OIL
1.0000 "application " | TOPICAL_OIL | Status: DC | PRN
  Administered 2019-10-24: 1 via TOPICAL

## 2019-10-22 MED ORDER — LACTATED RINGERS IV SOLN: INTRAVENOUS | Status: DC

## 2019-10-22 MED ORDER — TRANEXAMIC ACID-NACL 1000-0.7 MG/100ML-% IV SOLN
INTRAVENOUS | Status: AC
  Filled 2019-10-22: qty 100

## 2019-10-22 MED ORDER — FUROSEMIDE 10 MG/ML IJ SOLN
INTRAMUSCULAR | Status: AC
  Filled 2019-10-22: qty 2

## 2019-10-22 MED ORDER — ACETAMINOPHEN 325 MG PO TABS: 650.0000 mg | ORAL_TABLET | ORAL | Status: DC | PRN

## 2019-10-22 MED ORDER — OXYCODONE HCL 5 MG/5ML PO SOLN: 5.0000 mg | Freq: Once | ORAL | Status: DC | PRN

## 2019-10-22 MED ORDER — ESCITALOPRAM OXALATE 10 MG PO TABS
15.0000 mg | ORAL_TABLET | Freq: Every day | ORAL | Status: DC
  Administered 2019-10-23 – 2019-10-27 (×5): 15 mg via ORAL
  Filled 2019-10-22 (×2): qty 2
  Filled 2019-10-22: qty 1.5
  Filled 2019-10-22 (×2): qty 2
  Filled 2019-10-22: qty 1.5
  Filled 2019-10-22 (×2): qty 2

## 2019-10-22 SURGICAL SUPPLY — 19 items
BALLN POSTPARTUM SOS BAKRI (BALLOONS) ×2
BALLOON POSTPARTUM SOS BAKRI (BALLOONS) IMPLANT
BNDG GAUZE ELAST 4 BULKY (GAUZE/BANDAGES/DRESSINGS) ×1 IMPLANT
Bard Infection Control Urinary Drainage BAg ×1 IMPLANT
CLOTH BEACON ORANGE TIMEOUT ST (SAFETY) ×2 IMPLANT
GLOVE BIO SURGEON STRL SZ7 (GLOVE) ×4 IMPLANT
GLOVE BIOGEL PI IND STRL 7.0 (GLOVE) ×1 IMPLANT
GLOVE BIOGEL PI INDICATOR 7.0 (GLOVE) ×1
GOWN STRL REUS W/TWL LRG LVL3 (GOWN DISPOSABLE) ×4 IMPLANT
KIT BERKELEY 1ST TRIMESTER 3/8 (MISCELLANEOUS) ×2 IMPLANT
NS IRRIG 1000ML POUR BTL (IV SOLUTION) ×2 IMPLANT
PACK VAGINAL MINOR WOMEN LF (CUSTOM PROCEDURE TRAY) ×2 IMPLANT
PAD OB MATERNITY 4.3X12.25 (PERSONAL CARE ITEMS) ×2 IMPLANT
PAD PREP 24X48 CUFFED NSTRL (MISCELLANEOUS) ×2 IMPLANT
PLUG CATH AND CAP STER (CATHETERS) ×1 IMPLANT
SET BERKELEY SUCTION TUBING (SUCTIONS) ×2 IMPLANT
SUT VIC AB 2-0 CT1 (SUTURE) ×1 IMPLANT
TOWEL OR 17X24 6PK STRL BLUE (TOWEL DISPOSABLE) ×4 IMPLANT
VACURETTE 9 RIGID CVD (CANNULA) ×1 IMPLANT

## 2019-10-22 NOTE — Progress Notes (Signed)
Nephrology Progress Note:   Patient ID: Kathy Lewis, female   DOB: Sep 21, 1995, 24 y.o.   MRN: [id number]  S:  She was moved to labor and delivery and was induced on 2/23.  Mag initiated per OB and now off.  She did require phenylephrine overnight which OB feels is secondary to epidural.  Also got a dose of lasix overnight.  Spoke with OB and she is reducing procardia to 60 mg daily.   Vision is better.     Review of systems  Denies n/v Denies shortness of breath or chest pain  Denies dizziness   O:BP 135/77   Pulse 88   Temp 97.9 F (36.6 C) (Oral)   Resp 18   Ht 5' (1.524 m)   Wt 86.2 kg   LMP 03/14/2019 (Exact Date)   SpO2 94%   BMI 37.13 kg/m   Intake/Output Summary (Last 24 hours) at 10/22/2019 0929 Last data filed at 10/22/2019 0901 Gross per 24 hour  Intake 4135.03 ml  Output 1841 ml  Net 2294.03 ml   Intake/Output: I/O last 3 completed shifts: In: 3902.4 [P.O.:930; I.V.:2972.4] Out: 1908 [Urine:1608; Emesis/NG output:300]  Intake/Output this shift:  Total I/O In: 232.6 [I.V.:232.6] Out: 83 [Urine:83] Weight change:    Physical exam:   Gen: adult female NAD  CVS: S1S2 no rub Resp: cta unlabored   Abd: gravid uterus, nontender Ext: 1-2+ lower extremity edema bilaterally Psych normal mood and affect.  Neuro - alert and oriented x 3; follows commands and provides history  Recent Labs  Lab 10/18/19 0534 10/19/19 0806 10/19/19 1718 10/20/19 0622 10/21/19 0536 10/21/19 1721 10/22/19 0537  NA 137 136 138 137 137 134* 134*  K 4.1 4.6 4.1 4.1 3.9 4.0 3.9  CL 109 108 109 109 108 106 105  CO2 21* 22 23 20* 20* 21* 21*  GLUCOSE 73 169* 50* 78 99 160* 165*  BUN 17 20 20 20 18 16 16   CREATININE 1.83* 1.55* 1.58* 1.67* 1.59* 1.68* 1.75*  ALBUMIN 1.5* 1.4* 1.6* 1.3* 1.6* 1.6* 1.6*  CALCIUM 7.9* 7.7* 7.9* 7.7* 7.8* 7.7* 7.3*  AST 19 19 21 18 18 18 20   ALT 14 15 14 13 13 11 13    Liver Function Tests: Recent Labs  Lab 10/21/19 0536 10/21/19 1721  10/22/19 0537  AST 18 18 20   ALT 13 11 13   ALKPHOS 60 64 62  BILITOT 0.9 0.3 0.7  PROT 4.6* 4.9* 4.9*  ALBUMIN 1.6* 1.6* 1.6*   CBC: Recent Labs  Lab 10/19/19 1718 10/19/19 1718 10/20/19 0622 10/20/19 0622 10/21/19 0536 10/21/19 1721 10/22/19 0537  WBC 9.3   < > 7.1   < > 8.9 9.2 9.8  NEUTROABS 6.3  --   --   --   --   --   --   HGB 8.2*   < > 7.0*   < > 8.7* 9.1* 9.1*  HCT 24.1*   < > 19.9*   < > 25.1* 26.9* 27.3*  MCV 93.1  --  90.9  --  90.6 92.1 92.9  PLT 127*   < > 112*   < > 138* 146* 150   < > = values in this interval not displayed.   Cardiac Enzymes: No results for input(s): CKTOTAL, CKMB, CKMBINDEX, TROPONINI in the last 168 hours. CBG: Recent Labs  Lab 10/22/19 0504 10/22/19 0602 10/22/19 0704 10/22/19 0810 10/22/19 0905  GLUCAP 167* 138* 127* 119* 108*    Iron Studies:  No results for input(s):  IRON, TIBC, TRANSFERRIN, FERRITIN in the last 72 hours. Studies/Results: [place] MFM OB Transvaginal  Result Date: 10/20/2019 ----------------------------------------------------------------------  OBSTETRICS REPORT                       (Signed Final 10/20/2019 10:41 am) ---------------------------------------------------------------------- Patient Info  ID #:       [id number]                          D.O.B.:  10/11/1995 (23 yrs)  Name:       Kathy Lewis                   Visit Date: 10/20/2019 09:22 am ---------------------------------------------------------------------- Performed By  Performed By:     Novella Rob        Ref. Address:     [address]                                                             [place]  Attending:        Tama High MD        Secondary Phy.:   [facility]                                                             Care  Referred By:      Estill Bamberg                  Location:         [facility]                               Fetal [facility] ---------------------------------------------------------------------- Orders   #  Description                          Code         Ordered By   1  [place] MFM FETAL BPP WO NON  [id number]     EVELYN VARNADO      STRESS   2  [place] MFM OB TRANSVAGINAL               [id number]      [facility] VARNADO  ----------------------------------------------------------------------   #  Order #                    Accession #                 Episode #   1  [id number]                  [id number]   2  [id number]                  [id number]                  [id number]  ---------------------------------------------------------------------- Indications   Pre-eclampsia                                  O14.90   Cervical shortening, third trimester           O26.873   Polyhydramnios, third trimester, antepartum    O40.3XX0   condition or complication, unspecified fetus   Encounter for cervical length                  Z36.86   Pre-existing diabetes, type 1, in pregnancy,   O24.013   third trimester   [redacted] weeks gestation of pregnancy                Z3A.30  ---------------------------------------------------------------------- Fetal Evaluation  Num Of Fetuses:         1  Fetal Heart Rate(bpm):  133  Cardiac Activity:       Observed  Presentation:           Cephalic  Placenta:               Posterior  P. Cord Insertion:      Previously Visualized  Amniotic Fluid  AFI FV:      Polyhydramnios  AFI Sum(cm)     %Tile       Largest Pocket(cm)  26.11           > 97        8.79  RUQ(cm)       RLQ(cm)       LUQ(cm)        LLQ(cm)  6.12          6.22          8.79           4.98 ---------------------------------------------------------------------- Biophysical Evaluation  Amniotic F.V:   Within normal limits       F. Tone:        Observed  F. Movement:    Observed                   Score:          6/8  F. Breathing:   Not  Observed ---------------------------------------------------------------------- OB History  Gravidity:    2         Term:   0        Prem:   0        SAB:  1  TOP:          0       Ectopic:  0        Living: 0 ---------------------------------------------------------------------- Gestational Age  LMP:           31w 3d        Date:  03/14/19                 EDD:   12/19/19  Best:          Weston Settle 3d     Det. ByLoman Chroman         EDD:   12/26/19                                      (04/30/19) ---------------------------------------------------------------------- Cervix Uterus Adnexa  Cervix  Length:            0.4  cm.  See comments ---------------------------------------------------------------------- Impression  G2 P0.  Patient with type 1 diabetes with diabetic  nephropathy was admitted 5 days ago with diagnosis of  preeclampsia.  On ultrasound performed at admission, fetal  growth was appropriate for gestational age.  On today's ultrasound, polyhydramnios is seen (AFI= 26 cm).  Cephalic presentation.  Fetal breathing movements did not  meet the criteria of BPP.  I reviewed the NST which is  reactive.  BPP 6/8.  Because of the appearance of cervical shortening, we  performed transvaginal ultrasound to evaluate the cervix.  The cervical canal is dilated with a residual closed portion of  cervix measuring 4 millimeters.  Cervical shortening and polyhydramnios increase the risk of  preterm delivery. ---------------------------------------------------------------------- Recommendations  -Twice-weekly BPP (Mon-Thu). ----------------------------------------------------------------------                  Tama High, MD Electronically Signed Final Report   10/20/2019 10:41 am ----------------------------------------------------------------------  DG Chest Port 1 View  Addendum Date: 10/21/2019   ADDENDUM REPORT: 10/21/2019 21:05 ADDENDUM: Correction: The impression should read: "Marked enlargement of the  cardiac silhouette. Cardiomegaly versus pericardial effusion. The possibility of cardiomyopathy should be considered. Slight atelectasis at the left base posterior medially. The" Electronically Signed   By: Lorriane Shire M.D.   On: 10/21/2019 21:05   Result Date: 10/21/2019 CLINICAL DATA:  Severe preeclampsia. EXAM: PORTABLE CHEST 1 VIEW COMPARISON:  10/16/2018 FINDINGS: There is marked enlargement of the cardiac silhouette with a globular configuration. This could represent global cardiomegaly or a pericardial effusion. Slight distention of the azygos vein. No discrete infiltrates or effusions. No bone abnormality. IMPRESSION: Marked enlargement of the cardiac silhouette. Cardiomegaly versus pericardial effusion. Electronically Signed: By: Lorriane Shire M.D. On: 10/21/2019 20:26   [place] MFM FETAL BPP WO NON STRESS  Result Date: 10/20/2019 ----------------------------------------------------------------------  OBSTETRICS REPORT                       (Signed Final 10/20/2019 10:41 am) ---------------------------------------------------------------------- Patient Info  ID #:       [id number]                          D.O.B.:  1995-09-21 (23 yrs)  Name:       LAKEMA Zirbes                   Visit Date: 10/20/2019 09:22 am ---------------------------------------------------------------------- Performed By  Performed By:  Novella Rob        Ref. Address:     [place]                                                             [address] [place]  Attending:        Tama High MD        Secondary Phy.:   [facility]                                                             Care  Referred By:      Estill Bamberg                 [facility]                               Fetal Care  ---------------------------------------------------------------------- Orders   #  Description                          Code         Ordered By   1  [place] MFM FETAL BPP WO NON              76819.01     EVELYN VARNADO      STRESS   2  [place] MFM OB TRANSVAGINAL               [id number]      EVELYN VARNADO  ----------------------------------------------------------------------   #  Order #                    Accession #                 Episode #   1  [id number]                  [id number]                  [id number]   2  [id number]                  [id number]                  [id number]  ---------------------------------------------------------------------- Indications   Pre-eclampsia                                  O14.90   Cervical shortening, third trimester           O26.873   Polyhydramnios, third trimester, antepartum    O40.3XX0   condition or complication, unspecified fetus   Encounter for cervical length                  Z36.86   Pre-existing diabetes, type 1, in pregnancy,   O24.013   third trimester   [redacted] weeks gestation of pregnancy                Z3A.30  ---------------------------------------------------------------------- Fetal Evaluation  Num Of Fetuses:         1  Fetal Heart Rate(bpm):  133  Cardiac Activity:       Observed  Presentation:           Cephalic  Placenta:               Posterior  P. Cord Insertion:      Previously Visualized  Amniotic Fluid  AFI FV:      Polyhydramnios  AFI Sum(cm)     %Tile       Largest Pocket(cm)  26.11           > 97        8.79  RUQ(cm)       RLQ(cm)       LUQ(cm)        LLQ(cm)  6.12          6.22          8.79           4.98 ---------------------------------------------------------------------- Biophysical Evaluation  Amniotic F.V:   Within normal limits       F. Tone:        Observed  F. Movement:    Observed                   Score:          6/8  F. Breathing:   Not Observed ---------------------------------------------------------------------- OB History  Gravidity:    2          Term:   0        Prem:   0        SAB:   1  TOP:          0       Ectopic:  0        Living: 0 ---------------------------------------------------------------------- Gestational Age  LMP:           31w 3d        Date:  03/14/19                 EDD:   12/19/19  Best:          Weston Settle 3d     Det. ByLoman Chroman         EDD:   12/26/19                                      (  04/30/19) ---------------------------------------------------------------------- Cervix Uterus Adnexa  Cervix  Length:            0.4  cm.  See comments ---------------------------------------------------------------------- Impression  G2 P0.  Patient with type 1 diabetes with diabetic  nephropathy was admitted 5 days ago with diagnosis of  preeclampsia.  On ultrasound performed at admission, fetal  growth was appropriate for gestational age.  On today's ultrasound, polyhydramnios is seen (AFI= 26 cm).  Cephalic presentation.  Fetal breathing movements did not  meet the criteria of BPP.  I reviewed the NST which is  reactive.  BPP 6/8.  Because of the appearance of cervical shortening, we  performed transvaginal ultrasound to evaluate the cervix.  The cervical canal is dilated with a residual closed portion of  cervix measuring 4 millimeters.  Cervical shortening and polyhydramnios increase the risk of  preterm delivery. ---------------------------------------------------------------------- Recommendations  -Twice-weekly BPP (Mon-Thu). ----------------------------------------------------------------------                  Tama High, MD Electronically Signed Final Report   10/20/2019 10:41 am ----------------------------------------------------------------------  . escitalopram  15 mg Oral Daily  . metroNIDAZOLE  500 mg Oral Q12H  . NIFEdipine  90 mg Oral Daily  . oxytocin 40 units in LR 1000 mL  500 mL Intravenous Once  . valACYclovir  500 mg Oral Daily    BMET    Component Value Date/Time   NA 134 (L) 10/22/2019 0537   K 3.9  10/22/2019 0537   CL 105 10/22/2019 0537   CO2 21 (L) 10/22/2019 0537   GLUCOSE 165 (H) 10/22/2019 0537   BUN 16 10/22/2019 0537   CREATININE 1.75 (H) 10/22/2019 0537   CALCIUM 7.3 (L) 10/22/2019 0537   GFRNONAA 40 (L) 10/22/2019 0537   GFRAA 47 (L) 10/22/2019 0537   CBC    Component Value Date/Time   WBC 9.8 10/22/2019 0537   RBC 2.94 (L) 10/22/2019 0537   HGB 9.1 (L) 10/22/2019 0537   HCT 27.3 (L) 10/22/2019 0537   HCT 23.0 (L) 10/18/2019 0534   PLT 150 10/22/2019 0537   MCV 92.9 10/22/2019 0537   MCH 31.0 10/22/2019 0537   MCHC 33.3 10/22/2019 0537   RDW 14.4 10/22/2019 0537   LYMPHSABS 2.2 10/19/2019 1718   MONOABS 0.6 10/19/2019 1718   EOSABS 0.1 10/19/2019 1718   BASOSABS 0.0 10/19/2019 1718    Assessment/Plan: 1. Preeclampsia- has nephrotic range proteinuria and worsening HTN.  Currently on procardia xl 60 mg with improvement.  Possible atypical HELLP per MFM.  1. On procardia XL - spoke with OB and this was decreased to 60 mg daily  2. Asa 81 mg daily 3. Cr 1.75 4. Note thrombocytopenia  improving plt 150 5. Timing of delivery per OB/MFM - spoke with OB and they are planning on delivery today, 2/23.  6. Fluids are per OB 2. HTN:  On procardia - reducing dose to 60 mg.  If BP rises again would add labetalol 100 mg BID. Discussed importance of low salt diet. 3. Cardiomegaly - noted CXR.  OB is ordering TTE to assess for cardiomyopathy or effusion  4. AKI/CKD stage 3- slight bump in Scr likely due to combination of preeclampsia as well as significant anemia.  with hypotension associated with epidural  1. Scr 1.75 2. Continue to follow UOP and Scr 5. Type 1 DM- poorly controlled prior to pregnancy with improvement 6. CKD stage 3 - due to diabetic nephropathy.  Stressed importance of diabetic control with goal  Hgb A1c <7%, use of an ACE or ARB, however unable to use during pregnancy.  Also discussed the need to avoid NSAIDs/Cox-II I"s.  Discontinued Fleet's PRN 1. She  has already established care at our office with Dr. Carolin Sicks.  7. Anemia- s/p blood transfusion.   1. PRBC's given on 2/22.  Hb 9.1 on 2/24 2. On iron PO  3. S/p IV feraheme 510 mg x 1 dose on 2/21 as per discussion with Dr. Mancel Bale  4. S/p blood transfusion prior to admission.   5. May require ESA although it will take weeks to take effect  6. Transfuse prn     Claudia Desanctis, MD  10/22/2019 9:51 AM

## 2019-10-22 NOTE — Progress Notes (Signed)
Per Dr. Alesia Richards request, pt's vital signs discussed with Dr. Jillyn Hidden. Orders received for additional dose of phenylephrine and oxygen via nasal cannula @ 2L/min.

## 2019-10-22 NOTE — Progress Notes (Signed)
Pt seen at 1930  Pt reports she is feeling better. Her blurry vision has resolved but she still has scotomata.  Denies SOB, headache. Denies being lightheaded but feels like she is in a daze, like feeling high.  Pt states she has not talked with the cardiologist yet.  Magnesium not started b/c Mg level was 5.5 @ 1200 and UOP was decreased.  Pt got Levimir 12 units @ ~ 1330.  CBGs q 4 hours, cover with SSI.  Gen:  NAD, A&O x 3. Abd:  Fundus firm and Nontender Perineum:  Moderate blood on ice pack.  CBG (last 3)  Recent Labs    10/22/19 1519 10/22/19 1608 10/22/19 2016  GLUCAP 111* 87 89    Intake/Output Summary (Last 24 hours) at 10/22/2019 2328 Last data filed at 10/22/2019 2200 Gross per 24 hour  Intake 3338.4 ml  Output 3055 ml  Net 283.4 ml   CBC Latest Ref Rng & Units 10/22/2019 10/22/2019 10/22/2019  WBC 4.0 - 10.5 K/uL 12.5(H) 13.9(H) 10.6(H)  Hemoglobin 12.0 - 15.0 g/dL 6.6(LL) 7.2(L) 9.3(L)  Hematocrit 36.0 - 46.0 % 18.8(L) 21.2(L) 27.8(L)  Platelets 150 - 400 K/uL 133(L) 133(L) 151    CMP Latest Ref Rng & Units 10/22/2019 10/22/2019 10/22/2019  Glucose 70 - 99 mg/dL 111(H) 103(H) 165(H)  BUN 6 - 20 mg/dL 15 14 16   Creatinine 0.44 - 1.00 mg/dL 1.71(H) 1.90(H) 1.75(H)  Sodium 135 - 145 mmol/L 136 135 134(L)  Potassium 3.5 - 5.1 mmol/L 4.1 3.9 3.9  Chloride 98 - 111 mmol/L 107 104 105  CO2 22 - 32 mmol/L 22 22 21(L)  Calcium 8.9 - 10.3 mg/dL 6.9(L) 7.3(L) 7.3(L)  Total Protein 6.5 - 8.1 g/dL 4.0(L) 4.8(L) 4.9(L)  Total Bilirubin 0.3 - 1.2 mg/dL 0.5 0.5 0.7  Alkaline Phos 38 - 126 U/L 58 72 62  AST 15 - 41 U/L 21 23 20   ALT 0 - 44 U/L 13 12 13    Magnesium 5.1 @ 2200  A/P Stable.  Clinically improving Transfuse 1 unit PRBC over 2 hours.  Lasix 20 mg IV q 6 hours x 2 doses.   Continue Foley for Strict I/Os overnight. . Continue Magnesium at 1 g/hour.  Repeat labs at 0500. Normal EF but moderate pericardial effusion, may be due to Preeclampsia with severe featurers.  F/u cardiology recommendations. Type I DM-well controlled with Levimir and SSI. PP Hemorrhage, s/p D&E, s/p Unasyn Intraop.  Bleeding normal.  Continue to monitor closely.

## 2019-10-22 NOTE — Transfer of Care (Signed)
Immediate Anesthesia Transfer of Care Note  Patient: Kathy Lewis  Procedure(s) Performed: ULTRASOUND GUIDED DILATATION AND EVACUATION (N/A Vagina )  Patient Location: PACU  Anesthesia Type:Epidural  Level of Consciousness: awake, alert  and oriented  Airway & Oxygen Therapy: Patient Spontanous Breathing  Post-op Assessment: Report given to RN and Post -op Vital signs reviewed and stable  Post vital signs: Reviewed and stable  Last Vitals:  Vitals Value Taken Time  BP 122/72 10/22/19 1415  Temp 36.6 C 10/22/19 1410  Pulse 94 10/22/19 1429  Resp 14 10/22/19 1429  SpO2 97 % 10/22/19 1429  Vitals shown include unvalidated device data.  Last Pain:  Vitals:   10/22/19 1415  TempSrc:   PainSc: 0-No pain      Patients Stated Pain Goal: 3 ([id number])  Complications: No apparent anesthesia complications

## 2019-10-22 NOTE — Consult Note (Signed)
MFM Note  I reviewed the patient's chart today.  She was delivered today due to probable severe preeclampsia in the setting of chronic hypertension and renal disease.  Due to preeclampsia, I would recommend that the patient receive low-dose magnesium (1 g/hour) for 12 hours following delivery for maternal seizure prophylaxis.  Her magnesium levels should be obtained every 4 hours and the magnesium dosage titrated accordingly.  The patient's blood pressures should be controlled in the postpartum period using antihypertensive medications.  She should probably continue the Procardia that she was being treated with prior to delivery.  An ACE inhibitor may be added as needed for blood pressure control.  Hopefully, her blood pressures will continue to improve following delivery.  However, her blood pressures may worsen in the first few days following delivery.  Aggressive treatment using multiple antihypertensive medications should be given in the postpartum period.   Due to the cardiomegaly noted on her chest x-ray and the concern for pulmonary edema and potential cardiomyopathy, the patient will have a cardiology consult and a echocardiogram performed after delivery.  Due to her significant edema, consideration should be given to diuresis using Lasix.  Lasix may be given in 20 mg increments every 4-6 hours as needed.  Her diabetes should be controlled using the appropriate insulin dosages.

## 2019-10-22 NOTE — Progress Notes (Signed)
I checked in on Kathy Lewis after delivery to offer support.  She was preparing to transition to her postpartum room.  We will check in at a later time.  Chaplain Janne Napoleon, [place] Pager, [phone] 5:01 PM

## 2019-10-22 NOTE — Progress Notes (Signed)
CRITICAL VALUE ALERT  Critical Value:  Magnesium level 6.2  Date & Time Notied:  2/24@0130   Provider Notified: Dr. Alesia Richards  Orders Received/Actions taken: orders received

## 2019-10-22 NOTE — Progress Notes (Addendum)
Inpatient Diabetes Program Recommendations  AACE/ADA: New Consensus Statement on Inpatient Glycemic Control (2015)  Target Ranges:  Prepandial:   less than 140 mg/dL      Peak postprandial:   less than 180 mg/dL (1-2 hours)      Critically ill patients:  140 - 180 mg/dL   Lab Results  Component Value Date   GLUCAP 119 (H) 10/22/2019   HGBA1C 12.1 (H) 12/18/2014    Review of Glycemic Control Results for BECKEY, HEIFETZ (MRN [id number]) as of 10/22/2019 08:41  Ref. Range 10/22/2019 06:02 10/22/2019 07:04 10/22/2019 08:10  Glucose-Capillary Latest Ref Range: 70 - 99 mg/dL 138 (H) 127 (H) 119 (H)   Diabetes history: Type 1 DM Outpatient Diabetes medications: Novolog 5-15 units TID, NPH 20 units BID Pre pregnant- Lantus 30 units QHS, Humalog 06/06/14 TID Current orders for Inpatient glycemic control: IV insulin BMZ x 2 on 2/17 & 2/18  Inpatient Diabetes Program Recommendations:     Consider discontinuing Novolog & NPH while on IV insulin.   In preparation for transition at delivery consider:  -Levemir 12 units TWO hours PRIOR to discontinuation of IV insulin, then QD to follow (can adjust to NPH postpartum). -Novolog 0-6 units Q4H.  -When able to tolerate oral intake, consider adding Novolog 3 units TID (assuming patient consuming >50% of meal) and switch Novolog 0-6 units to TID & HS. Discussed plan of care with Dr Simona Huh and orders received. Will continue to follow.    Addendum@1109 : Spoke with Dr Nelda Marseille. Patient preparing for delivery. Orders placed for initial dose of basal insulin. MD to place further orders for postpartum.  @1310 -Updated recommendations based on patient to OR.   Thanks, Bronson Curb, MSN, RNC-OB Diabetes Coordinator [phone] (8a-5p)

## 2019-10-22 NOTE — Progress Notes (Signed)
  Echocardiogram 2D Echocardiogram has been performed.  Randa Lynn Stepheni Cameron 10/22/2019, 5:29 PM

## 2019-10-22 NOTE — Progress Notes (Signed)
Cardiology consult done. Spoke with cardiologist.  Echocardiogram complete ordered with contrast.  They will evaluate echo and then come by to see pt.

## 2019-10-22 NOTE — Progress Notes (Signed)
CRITICAL VALUE ALERT  Critical Value:  Magnesium Level 6.1  Date & Time Notied:  2/24@0640   Provider Notified: Dr. Alesia Richards

## 2019-10-22 NOTE — Progress Notes (Signed)
Patient ID: Kathy Lewis, female   DOB: 10-27-1995, 24 y.o.   MRN: [id number]   Subjective: Patient reports she still has spots in the eyes but no more blurry vision.   She denies headache,nausea, vomiting, epigastric pain, chest pain, shortness of breath, palpitations, abdominal pain.  She denies feeling contractions, vaginal bleeding or leakage of fluid.  She reports normal gross fetal movement.    Objective: I have reviewed patient's vital signs, intake and output, medications and labs. Vitals: Vitals:   10/22/19 0201 10/22/19 0216 10/22/19 0217 10/22/19 0221  BP: 114/69  107/63   Pulse: 74  82   Resp:      Temp:      TempSrc:      SpO2: 92% (!) 89%  96%  Weight:      Height:       Input:291 cc/3hrs (Combination of Insulin, D5, LR, Magnesium sulfate, pitocin) Output: 180cc urine /6 hrs.  General: alert, cooperative and no distress Resp: clear to auscultation bilaterally Cardio: regular rate and rhythm, S1, S2 with SEM, no click, rub or gallop GI: soft, non-tender; bowel sounds normal; no masses,  no organomegaly.  Gravid uterus Extremities: extremities normal, atraumatic, no cyanosis.  With 2+ edema bilaterally in lower extremities.  2+ patellar reflex bilaterally.  EFM: 110 baseline, moderate variability, reactive TOCO: Irregular contractions.  Category 1 tracing CVX: 3/80%/-4.  Ballotable head.    Labs:  1)  Ref Range & Units 00:17 1 d ago 9 d ago  Magnesium 1.7 - 2.4 mg/dL 6.2High Panic   5.2High  CM  1.8 CM        Status:  Final result Visible to patient:  No (scheduled for 10/22/2019 2:02 AM) Next appt:  10/28/2019 at 08:30 AM in Internal Medicine ([facility])  Ref Range & Units 01:01 00:06 1 d ago 1 d ago  Glucose-Capillary 70 - 99 mg/dL 98  94 CM  91 CM  105High  CM        CHEST X RAY: ADDENDUM REPORT: 10/21/2019 21:05  ADDENDUM: Correction: The impression should read: "Marked enlargement of the cardiac silhouette. Cardiomegaly versus pericardial  effusion. The possibility of cardiomyopathy should be considered. Slight atelectasis at the left base posterior medially.   Medication:   Current Facility-Administered Medications:  .  0.9 %  sodium chloride infusion, , Intravenous, Continuous, [name], MD .  acetaminophen (TYLENOL) tablet 650 mg, 650 mg, Oral, Q4H PRN, [name], MD .  butalbital-acetaminophen-caffeine (FIORICET) 50-325-40 MG per tablet 1 tablet, 1 tablet, Oral, Q4H PRN, [name], MD .  dextrose 5 % in lactated ringers infusion, , Intravenous, Continuous, Alesia Richards, Catelynn Sparger, MD, Last Rate: 50 mL/hr at 10/21/19 2146, Rate Change at 10/21/19 2146 .  dextrose 5 %-0.45 % sodium chloride infusion, , Intravenous, Continuous, [name], MD .  dextrose 50 % solution 0-50 mL, 0-50 mL, Intravenous, PRN, [name], MD .  diphenhydrAMINE (BENADRYL) injection 12.5 mg, 12.5 mg, Intravenous, Q15 min PRN, Hatchett, Franklin, MD .  ePHEDrine injection 10 mg, 10 mg, Intravenous, PRN, Hatchett, Franklin, MD .  ePHEDrine injection 10 mg, 10 mg, Intravenous, PRN, Hatchett, Mateo Flow, MD .  escitalopram (LEXAPRO) tablet 15 mg, 15 mg, Oral, Daily, [name], MD, 15 mg at 10/21/19 0957 .  fentaNYL (SUBLIMAZE) injection 50-100 mcg, 50-100 mcg, Intravenous, Q1H PRN, [name], MD .  fentaNYL 2 mcg/mL w/ bupivacaine 0.125% in NS 250 mL epidural infusion ([facility]ANES), 12 mL/hr, Epidural, Continuous PRN, Lyn Hollingshead, MD .  labetalol (NORMODYNE) injection  20 mg, 20 mg, Intravenous, PRN **AND** labetalol (NORMODYNE) injection 40 mg, 40 mg, Intravenous, PRN **AND** labetalol (NORMODYNE) injection 80 mg, 80 mg, Intravenous, PRN **AND** hydrALAZINE (APRESOLINE) injection 10 mg, 10 mg, Intravenous, PRN **AND** Measure blood pressure, , , Once, [name], MD .  labetalol (NORMODYNE) injection 20 mg, 20 mg, Intravenous, PRN **AND** labetalol (NORMODYNE) injection 40 mg, 40 mg, Intravenous, PRN **AND** labetalol  (NORMODYNE) injection 80 mg, 80 mg, Intravenous, PRN **AND** hydrALAZINE (APRESOLINE) injection 10 mg, 10 mg, Intravenous, PRN **AND** Measure blood pressure, , , Once, [name], MD .  hydrOXYzine (ATARAX/VISTARIL) tablet 50 mg, 50 mg, Oral, Q6H PRN, [name], MD .  CBG monitoring, , , Q4H **AND** insulin aspart (novoLOG) injection 0-14 Units, 0-14 Units, Subcutaneous, Q4H, [name], MD .  insulin NPH Human (NOVOLIN N) injection 9 Units, 9 Units, Subcutaneous, BID AC & HS, [name], MD .  insulin regular, human (MYXREDLIN) 100 units/ 100 mL infusion, , Intravenous, Continuous, [name], MD, Last Rate: 0.7 mL/hr at 10/22/19 0100, 0.7 Units/hr at 10/22/19 0100 .  lactated ringers infusion 500-1,000 mL, 500-1,000 mL, Intravenous, PRN, [name], MD .  lactated ringers infusion, , Intravenous, Continuous, Ozan, Jennifer, DO, Last Rate: 20 mL/hr at 10/21/19 2045, Restarted at 10/21/19 2045 .  magnesium sulfate 40 grams in SWI 1000 mL OB infusion, 0.5 g/hr, Intravenous, Continuous, Bently Wyss, MD, Last Rate: 12.5 mL/hr at 10/22/19 0130, 0.5 g/hr at 10/22/19 0130 .  methyldopa (ALDOMET) tablet 250 mg, 250 mg, Oral, BID, [name], MD, 250 mg at 10/21/19 1004 .  metoCLOPramide (REGLAN) tablet 10 mg, 10 mg, Oral, Q6H PRN, [name], MD .  metroNIDAZOLE (FLAGYL) tablet 500 mg, 500 mg, Oral, Q12H, [name], MD, 500 mg at 10/21/19 0956 .  misoprostol (CYTOTEC) tablet 25 mcg, 25 mcg, Vaginal, Q4H PRN, [name], MD, 25 mcg at 10/21/19 1228 .  NIFEdipine (PROCARDIA-XL/NIFEDICAL-XL) 24 hr tablet 90 mg, 90 mg, Oral, Daily, [name], MD, 90 mg at 10/21/19 0956 .  ondansetron (ZOFRAN) injection 4 mg, 4 mg, Intravenous, Q6H PRN, [name], MD, 4 mg at 10/21/19 2244 .  oxyCODONE-acetaminophen (PERCOCET/ROXICET) 5-325 MG per tablet 1 tablet, 1 tablet, Oral, Q4H PRN, [name], MD .  oxyCODONE-acetaminophen (PERCOCET/ROXICET) 5-325 MG per  tablet 2 tablet, 2 tablet, Oral, Q4H PRN, [name], MD .  oxytocin (PITOCIN) IV BOLUS FROM BAG, 500 mL, Intravenous, Once, [name], MD .  oxytocin (PITOCIN) IV infusion 40 units in NS 1000 mL - Premix, 2.5 Units/hr, Intravenous, Continuous, [name], MD .  oxytocin (PITOCIN) IV infusion 40 units in NS 1000 mL - Premix, 1-40 milli-units/min, Intravenous, Titrated, [name], MD, Last Rate: 19.5 mL/hr at 10/22/19 0200, 13 milli-units/min at 10/22/19 0200 .  PHENYLephrine 40 mcg/ml in normal saline Adult IV Push Syringe, 80 mcg, Intravenous, PRN, Lyn Hollingshead, MD, 80 mcg at 10/22/19 0235 .  sodium chloride (OCEAN) 0.65 % nasal spray 1 spray, 1 spray, Each Nare, PRN, Arrie Eastern, CNM .  sodium citrate-citric acid (ORACIT) solution 30 mL, 30 mL, Oral, Q2H PRN, [name], MD .  sodium phosphate (FLEET) 7-19 GM/118ML enema 1 enema, 1 enema, Rectal, PRN, [name], MD .  terbutaline (BRETHINE) injection 0.25 mg, 0.25 mg, Subcutaneous, Once PRN, [name], MD .  terbutaline (BRETHINE) injection 0.25 mg, 0.25 mg, Subcutaneous, Once PRN, [name], MD .  valACYclovir (VALTREX) tablet 500 mg, 500 mg, Oral, Daily, [name], MD, 500 mg at 10/21/19 0956 .  zolpidem (  AMBIEN) tablet 5 mg, 5 mg, Oral, QHS PRN, [name], MD  Facility-Administered Medications Ordered in Other Encounters:  .  fentaNYL (SUBLIMAZE) 500 mcg, bupivacaine (SENSORCAINE-MPF) 41.67 mL in sodium chloride (PF) 0.9 % 250 mL epidural, , Epidural, Continuous PRN, Hatchett, Mateo Flow, MD, Last Rate: 12 mL/hr at 10/21/19 2218, 12 mL/hr at 10/21/19 2218   Assessment/Plan:  24 y/o [id number] at 30 weeks 5 days EGA being induced for preeclampsia with severe features on pitocin, with epidural,   -Stable maternal and fetal status.  -s/p Betamethasone for fetal lung maturity, and is beta complete  -C/w magnesium sulfate for seizure prophylaxis, decrease rate to 0.5 g/hr given  increase in magnesium level. -c/w monitoring for signs and symptoms of magnesium sulfate toxicity as well as preeclampsia.   - recheck magnesium level at 6am.  -Phenylephrine for low BPs per anesthesia.  -GBS negative. Dr. Alesia Richards 10/22/2019.  Alinda Dooms, MD.

## 2019-10-22 NOTE — Progress Notes (Signed)
RN called Dr. Jillyn Hidden regarding pt's BP's. Pt nauseous and currently vomiting. Order received for Phenylephrine 120 mcg IVP x1.

## 2019-10-22 NOTE — Consult Note (Addendum)
Cardiology Consultation:   Patient ID: Shadee Rathod MRN: [id number]; DOB: August 13, 1996  Admit date: 10/15/2019 Date of Consult: 10/22/2019  Primary Care Provider: [facility], [facility] Primary Cardiologist: New - lives in [facility] Primary Electrophysiologist:  None    Patient Profile:   Aylah Yeary is a 24 y.o. female (newly postpartum) with a hx of Type 1 DM diagnosed at age 30, CKD stage III with nephrotic range proteinuria (followed by nephrology), anxiety, HSV, recent admission for severe anemia admitted with pre-eclampsia who is being seen today for the evaluation of cardiomegaly at the request of Dr. Simona Huh.  History of Present Illness:   She delivered a baby today at [redacted]w[redacted]d She has been under the care of nephrology this pregnancy and is followed by Dr. BCarolin Sicks She has type 1 DM that was poorly controlled prior to pregnancy with improvement.She was initially admitted 2/15-2/16 with severe anemia with Hgb 6.4 requiring 2 u PRBCs. She had been seeing floating in her vision and very fatigued. Her blood pressure had been mildly elevated for the last weeks. Due to h/o nephropathy, Procardia was prescribed. She returned to the [facility] 10/15/19 with red "floaties" in her right eye, elevated blood pressure of 164/90, and worsening lower extremity edema. She had not yet picked up Procardia from the pharmacy. She was found to have gestational HTN with concern for preeclampsia. She also had decreased platelet count. Due to AKI on CKD, nephrology was also consulted. Aldomet was added and required additional blood transfusion. Sodium restriction advised, but not fully compliant while in hospital. The decision was made to proceed with IOL. Per notes she required phenylephrine briefly overnight felt due to epidural. She delivered female baby at 11:44am today. She had postpartum hemorrhage shortly after delivery requiring additional blood transfusion and OR for D&E. CXR yesterday showed marked  enlargement of cardiac silhouette, prompting request for cardiology consult today. 2D echo was ordered showing normal EF 55-60%, mild LVH, indeterminate diastolic filling due to E-A fusion, normal RV, moderate pericardial effusion without evidence for tamponade. Last BPs ranging from 113/69 to 150/98, pulse ox normal, HR 80s-90s. Last labs showed Cr 1.90 (1.2-1.5 in late 2020), albumin 1.6, Hgb 9.3.  This evening she reports she is feeling great. She denies any current or recent CP or SOB. She did experience LEE throughout her pregnancy. She denies any history of recent URIs or known autoimmune disease. She is adopted so does not know full family history - thinks her father had preDM but isn't sure.   Past Medical History:  Diagnosis Date  . Anxiety   . Back spasm   . CKD (chronic kidney disease), stage III   . Diabetes mellitus without complication (HCC)    Type 1  . DKA (diabetic ketoacidoses) ([place]   . HSV infection    on valtrex  . Hypokalemia   . Leukocytosis   . Noncompliance with medication regimen   . Preeclampsia   . Severe anemia     Past Surgical History:  Procedure Laterality Date  . NO PAST SURGERIES       Home Medications:  Prior to Admission medications   Medication Sig Start Date End Date Taking? Authorizing Provider  acetaminophen (TYLENOL) 500 MG tablet Take 500 mg by mouth every 6 (six) hours as needed for mild pain.   Yes [provider]  aspirin EC 81 MG tablet Take 81 mg by mouth at bedtime.   Yes [provider]  butalbital-acetaminophen-caffeine (FIORICET) 50-325-40 MG tablet Take 1 tablet  by mouth every 4 (four) hours as needed for headache or migraine. 05/02/19  Yes [name], MD  escitalopram (LEXAPRO) 10 MG tablet Take 10 mg by mouth at bedtime.   Yes [provider]  insulin aspart (NOVOLOG) 100 UNIT/ML injection Inject 5-10 Units into the skin 3 (three) times daily with meals. Adjust per carb intake Patient taking  differently: Inject 5-15 Units into the skin 3 (three) times daily with meals. Adjust per carb intake 06/17/19  Yes [place], [place], CNM  insulin NPH Human (NOVOLIN N) 100 UNIT/ML injection Inject 0.15 mLs (15 Units total) into the skin 2 (two) times daily at 8 am and 10 pm. Patient taking differently: Inject 20 Units into the skin 2 (two) times daily at 8 am and 10 pm.  06/17/19  Yes Tamala Julian, [place], CNM  ondansetron (ZOFRAN) 4 MG tablet Take 1 tablet (4 mg total) by mouth every 8 (eight) hours as needed for nausea or vomiting. 06/17/19  Yes [place], [place], Moodus  Prenatal Vit-Fe Fumarate-FA (PRENATAL MULTIVITAMIN) TABS tablet Take 1 tablet by mouth at bedtime.   Yes [provider]  promethazine (PHENERGAN) 12.5 MG tablet Take 1 tablet (12.5 mg total) by mouth every 6 (six) hours as needed for nausea or vomiting. Patient not taking: Reported on 10/15/2019 05/02/19   [name], MD  sertraline (ZOLOFT) 25 MG tablet Take 1 tablet (25 mg total) by mouth daily. Patient not taking: Reported on 06/03/2019 05/03/19   [name], MD    Inpatient Medications: Scheduled Meds: . sodium chloride   Intravenous Once  . sodium chloride   Intravenous Once  . carboprost      . diphenoxylate-atropine  2 tablet Oral Once  . escitalopram  15 mg Oral Daily  . insulin aspart  0-6 Units Subcutaneous Q4H  . insulin detemir  12 Units Subcutaneous Daily  . metroNIDAZOLE  500 mg Oral Q12H  . misoprostol      . NIFEdipine  60 mg Oral Daily  . [START ON 10/23/2019] prenatal multivitamin  1 tablet Oral Q1200  . [START ON 10/23/2019] senna-docusate  2 tablet Oral Q24H  . [START ON 10/23/2019] Tdap  0.5 mL Intramuscular Once   Continuous Infusions: . sodium chloride    . lactated ringers 50 mL/hr at 10/22/19 2003  . magnesium sulfate 1 g/hr (10/22/19 1742)  . tranexamic acid     PRN Meds: acetaminophen, benzocaine-Menthol, butalbital-acetaminophen-caffeine, coconut oil, witch hazel-glycerin **AND**  dibucaine, diphenhydrAMINE, labetalol **AND** labetalol **AND** labetalol **AND** hydrALAZINE **AND** Measure blood pressure, magnesium hydroxide, metoCLOPramide, misoprostol, ondansetron **OR** ondansetron (ZOFRAN) IV, oxyCODONE, oxyCODONE, simethicone, sodium chloride, zolpidem  Allergies:    Allergies  Allergen Reactions  . Cantaloupe Extract Allergy Skin Test Itching    Mouth itching    . Nsaids     Avoid per nephrology    Social History:   Social History   Socioeconomic History  . Marital status: Single    Spouse name: Not on file  . Number of children: Not on file  . Years of education: Not on file  . Highest education level: Not on file  Occupational History  . Not on file  Tobacco Use  . Smoking status: Former Research scientist (life sciences)  . Smokeless tobacco: Never Used  Substance and Sexual Activity  . Alcohol use: No  . Drug use: No  . Sexual activity: Yes    Birth control/protection: None  Other Topics Concern  . Not on file  Social History Narrative  . Not on file  Social Determinants of Health   Financial Resource Strain:   . Difficulty of Paying Living Expenses: Not on file  Food Insecurity:   . Worried About Charity fundraiser in the Last Year: Not on file  . Ran Out of Food in the Last Year: Not on file  Transportation Needs:   . Lack of Transportation (Medical): Not on file  . Lack of Transportation (Non-Medical): Not on file  Physical Activity:   . Days of Exercise per Week: Not on file  . Minutes of Exercise per Session: Not on file  Stress:   . Feeling of Stress : Not on file  Social Connections:   . Frequency of Communication with Friends and Family: Not on file  . Frequency of Social Gatherings with Friends and Family: Not on file  . Attends Religious Services: Not on file  . Active Member of Clubs or Organizations: Not on file  . Attends Archivist Meetings: Not on file  . Marital Status: Not on file  Intimate Partner Violence:   . Fear of  Current or Ex-Partner: Not on file  . Emotionally Abused: Not on file  . Physically Abused: Not on file  . Sexually Abused: Not on file    Family History:   Family History  Adopted: Yes  Family history unknown: Yes     ROS:  Please see the history of present illness.  All other ROS reviewed and negative.     Physical Exam/Data:   Vitals:   10/22/19 1720 10/22/19 1730 10/22/19 1955 10/22/19 2058  BP: (!) 150/98 (!) 139/91 131/75   Pulse: 92 95 92   Resp:   14 15  Temp: 98 F (36.7 C)  98.8 F (37.1 C)   TempSrc:   Oral   SpO2: 98%  100%   Weight:      Height:        Intake/Output Summary (Last 24 hours) at 10/22/2019 2140 Last data filed at 10/22/2019 1944 Gross per 24 hour  Intake 3564.33 ml  Output 3440 ml  Net 124.33 ml   Last 3 Weights 10/21/2019 10/20/2019 10/19/2019  Weight (lbs) 190 lb 2 oz 190 lb 0.6 oz 190 lb 11.2 oz  Weight (kg) 86.24 kg 86.2 kg 86.5 kg     Body mass index is 37.13 kg/m.  Vital Signs. BP 131/75 (BP Location: Right Arm) Comment (BP Location): forearm due to IV placement  Pulse 92   Temp 98.8 F (37.1 C) (Oral)   Resp 15   Ht 5' (1.524 m)   Wt 86.2 kg   LMP 03/14/2019 (Exact Date)   SpO2 100%   Breastfeeding Unknown   BMI 37.13 kg/m  General: Well developed, well nourished AAF, in no acute distress. Head: Normocephalic, atraumatic, sclera non-icteric, no xanthomas, nares are without discharge. Neck: Negative for carotid bruits. JVP not elevated. Lungs: Clear bilaterally to auscultation without wheezes, rales, or rhonchi. Breathing is unlabored. Heart: RRR S1 S2 without murmurs, rubs, or gallops.  Abdomen: Soft, non-tender, non-distended with normoactive bowel sounds. No rebound/guarding. Extremities: No clubbing or cyanosis. No edema. Distal pedal pulses are 2+ and equal bilaterally. Neuro: Alert and oriented X 3. Moves all extremities spontaneously. Psych:  Responds to questions appropriately with a normal affect.  EKG:  The EKG  was personally reviewed and demonstrates: NSR 98bpm, low voltage QRS, nonspecific TW changes  Telemetry:  Not on telemetry  Relevant CV Studies: 2d Echo 10/22/19 1. Left ventricular ejection fraction, by estimation, is 55  to 60%. The  left ventricle has normal function. The left ventricle has no regional  wall motion abnormalities. There is mild concentric left ventricular  hypertrophy. Indeterminate diastolic  filling due to E-A fusion.  2. Right ventricular systolic function is normal. The right ventricular  size is normal. Tricuspid regurgitation signal is inadequate for assessing  PA pressure.  3. Moderate pericardial effusion. The pericardial effusion is  circumferential. There is no evidence of cardiac tamponade.  4. The mitral valve is grossly normal. No evidence of mitral valve  regurgitation.  5. The aortic valve is tricuspid. Aortic valve regurgitation is not  visualized. No aortic stenosis is present.  6. The inferior vena cava is normal in size with greater than 50%  respiratory variability, suggesting right atrial pressure of 3 mmHg.   Laboratory Data:  High Sensitivity Troponin:  No results for input(s): TROPONINIHS in the last 720 hours.   Chemistry Recent Labs  Lab 10/21/19 1721 10/22/19 0537 10/22/19 1207  NA 134* 134* 135  K 4.0 3.9 3.9  CL 106 105 104  CO2 21* 21* 22  GLUCOSE 160* 165* 103*  BUN '16 16 14  '$ CREATININE 1.68* 1.75* 1.90*  CALCIUM 7.7* 7.3* 7.3*  GFRNONAA 42* 40* 37*  GFRAA 49* 47* 42*  ANIONGAP '7 8 9    '$ Recent Labs  Lab 10/21/19 1721 10/22/19 0537 10/22/19 1207  PROT 4.9* 4.9* 4.8*  ALBUMIN 1.6* 1.6* 1.6*  AST '18 20 23  '$ ALT '11 13 12  '$ ALKPHOS 64 62 72  BILITOT 0.3 0.7 0.5   Hematology Recent Labs  Lab 10/22/19 0537 10/22/19 1207 10/22/19 1713  WBC 9.8 10.6* 13.9*  RBC 2.94* 3.01* 2.28*  HGB 9.1* 9.3* 7.2*  HCT 27.3* 27.8* 21.2*  MCV 92.9 92.4 93.0  MCH 31.0 30.9 31.6  MCHC 33.3 33.5 34.0  RDW 14.4 14.2 14.3  PLT  150 151 133*   BNPNo results for input(s): BNP, PROBNP in the last 168 hours.  DDimer No results for input(s): DDIMER in the last 168 hours.   Radiology/Studies:  [place] Intraoperative  Result Date: 10/22/2019 CLINICAL DATA:  Ultrasound was provided for use by the ordering physician, and a technical charge was applied by the performing facility.  No radiologist interpretation/professional services rendered.   [place] MFM OB Transvaginal  Result Date: 10/20/2019 ----------------------------------------------------------------------  OBSTETRICS REPORT                       (Signed Final 10/20/2019 10:41 am) ---------------------------------------------------------------------- Patient Info  ID #:       [id number]                          D.O.B.:  05-04-1996 (23 yrs)  Name:       Irene Shipper Fay                   Visit Date: 10/20/2019 09:22 am ---------------------------------------------------------------------- Performed By  Performed By:     Novella Rob        Ref. Address:     [address]                                                             [place]  Attending:        Tama High MD        Secondary Phy.:   [facility]                                                             Care  Referred By:      Estill Bamberg                 [facility]                               [facility]etal [facility] ---------------------------------------------------------------------- Orders   #  Description                          Code         Ordered By   1  [place] MFM FETAL BPP WO NON              76819.01     EVELYN VARNADO      STRESS   2  [place] MFM OB TRANSVAGINAL               27035.0      EVELYN VARNADO  ----------------------------------------------------------------------   #  Order #                     Accession #                 Episode #   1  [id number]                  [id number]                  [id number]   2  [id number]                  [id number]  ---------------------------------------------------------------------- Indications   Pre-eclampsia                                  O14.90   Cervical shortening, third trimester           O26.873   Polyhydramnios,  third trimester, antepartum    O40.3XX0   condition or complication, unspecified fetus   Encounter for cervical length                  Z36.86   Pre-existing diabetes, type 1, in pregnancy,   O24.013   third trimester   [redacted] weeks gestation of pregnancy                Z3A.30  ---------------------------------------------------------------------- Fetal Evaluation  Num Of Fetuses:         1  Fetal Heart Rate(bpm):  133  Cardiac Activity:       Observed  Presentation:           Cephalic  Placenta:               Posterior  P. Cord Insertion:      Previously Visualized  Amniotic Fluid  AFI FV:      Polyhydramnios  AFI Sum(cm)     %Tile       Largest Pocket(cm)  26.11           > 97        8.79  RUQ(cm)       RLQ(cm)       LUQ(cm)        LLQ(cm)  6.12          6.22          8.79           4.98 ---------------------------------------------------------------------- Biophysical Evaluation  Amniotic F.V:   Within normal limits       F. Tone:        Observed  F. Movement:    Observed                   Score:          6/8  F. Breathing:   Not Observed ---------------------------------------------------------------------- OB History  Gravidity:    2         Term:   0        Prem:   0        SAB:   1  TOP:          0       Ectopic:  0        Living: 0 ---------------------------------------------------------------------- Gestational Age  LMP:           31w 3d        Date:  03/14/19                 EDD:   12/19/19  Best:          Weston Settle 3d     Det. ByLoman Chroman         EDD:   12/26/19                                      (04/30/19)  ---------------------------------------------------------------------- Cervix Uterus Adnexa  Cervix  Length:            0.4  cm.  See comments ---------------------------------------------------------------------- Impression  G2 P0.  Patient with type 1 diabetes with diabetic  nephropathy was admitted 5 days ago with diagnosis of  preeclampsia.  On ultrasound performed at admission, fetal  growth was appropriate for gestational age.  On today's ultrasound, polyhydramnios is seen (AFI= 26 cm).  Cephalic presentation.  Fetal breathing  movements did not  meet the criteria of BPP.  I reviewed the NST which is  reactive.  BPP 6/8.  Because of the appearance of cervical shortening, we  performed transvaginal ultrasound to evaluate the cervix.  The cervical canal is dilated with a residual closed portion of  cervix measuring 4 millimeters.  Cervical shortening and polyhydramnios increase the risk of  preterm delivery. ---------------------------------------------------------------------- Recommendations  -Twice-weekly BPP (Mon-Thu). ----------------------------------------------------------------------                  Tama High, MD Electronically Signed Final Report   10/20/2019 10:41 am ----------------------------------------------------------------------  DG Chest Port 1 View  Addendum Date: 10/21/2019   ADDENDUM REPORT: 10/21/2019 21:05 ADDENDUM: Correction: The impression should read: "Marked enlargement of the cardiac silhouette. Cardiomegaly versus pericardial effusion. The possibility of cardiomyopathy should be considered. Slight atelectasis at the left base posterior medially. The" Electronically Signed   By: Lorriane Shire M.D.   On: 10/21/2019 21:05   Result Date: 10/21/2019 CLINICAL DATA:  Severe preeclampsia. EXAM: PORTABLE CHEST 1 VIEW COMPARISON:  10/16/2018 FINDINGS: There is marked enlargement of the cardiac silhouette with a globular configuration. This could represent global cardiomegaly or a  pericardial effusion. Slight distention of the azygos vein. No discrete infiltrates or effusions. No bone abnormality. IMPRESSION: Marked enlargement of the cardiac silhouette. Cardiomegaly versus pericardial effusion. Electronically Signed: By: Lorriane Shire M.D. On: 10/21/2019 20:26   [place] MFM FETAL BPP WO NON STRESS  Result Date: 10/20/2019 ----------------------------------------------------------------------  OBSTETRICS REPORT                       (Signed Final 10/20/2019 10:41 am) ---------------------------------------------------------------------- Patient Info  ID #:       [id number]                          D.O.B.:  Aug 13, 1996 (23 yrs)  Name:       Irene Shipper Burch                   Visit Date: 10/20/2019 09:22 am ---------------------------------------------------------------------- Performed By  Performed By:     Novella Rob        Ref. Address:     [address]  Attending:  Tama High MD        Secondary Phy.:   [facility]                                                             Care  Referred By:      Estill Bamberg                 [facility]                               [facility] ---------------------------------------------------------------------- Orders   #  Description                          Code         Ordered By   1  [place] MFM FETAL BPP WO NON              76819.01     EVELYN VARNADO      STRESS   2  [place] MFM OB TRANSVAGINAL               25638.9      EVELYN VARNADO  ----------------------------------------------------------------------   #  Order #                    Accession #                 Episode #   1  [id number]                  [id number]                  [id number]   2  [id number]                   [id number]                  [id number]  ---------------------------------------------------------------------- Indications   Pre-eclampsia                                  O14.90   Cervical shortening, third trimester           O26.873   Polyhydramnios, third trimester, antepartum    O40.3XX0   condition or complication, unspecified fetus   Encounter for cervical length                  Z36.86   Pre-existing diabetes, type 1, in pregnancy,   O24.013   third trimester   [redacted] weeks gestation of pregnancy                Z3A.30  ---------------------------------------------------------------------- Fetal Evaluation  Num Of Fetuses:         1  Fetal Heart Rate(bpm):  133  Cardiac Activity:       Observed  Presentation:           Cephalic  Placenta:  Posterior  P. Cord Insertion:      Previously Visualized  Amniotic Fluid  AFI FV:      Polyhydramnios  AFI Sum(cm)     %Tile       Largest Pocket(cm)  26.11           > 97        8.79  RUQ(cm)       RLQ(cm)       LUQ(cm)        LLQ(cm)  6.12          6.22          8.79           4.98 ---------------------------------------------------------------------- Biophysical Evaluation  Amniotic F.V:   Within normal limits       F. Tone:        Observed  F. Movement:    Observed                   Score:          6/8  F. Breathing:   Not Observed ---------------------------------------------------------------------- OB History  Gravidity:    2         Term:   0        Prem:   0        SAB:   1  TOP:          0       Ectopic:  0        Living: 0 ---------------------------------------------------------------------- Gestational Age  LMP:           31w 3d        Date:  03/14/19                 EDD:   12/19/19  Best:          Weston Settle 3d     Det. ByLoman Chroman         EDD:   12/26/19                                      (04/30/19) ---------------------------------------------------------------------- Cervix Uterus Adnexa  Cervix  Length:            0.4  cm.  See  comments ---------------------------------------------------------------------- Impression  G2 P0.  Patient with type 1 diabetes with diabetic  nephropathy was admitted 5 days ago with diagnosis of  preeclampsia.  On ultrasound performed at admission, fetal  growth was appropriate for gestational age.  On today's ultrasound, polyhydramnios is seen (AFI= 26 cm).  Cephalic presentation.  Fetal breathing movements did not  meet the criteria of BPP.  I reviewed the NST which is  reactive.  BPP 6/8.  Because of the appearance of cervical shortening, we  performed transvaginal ultrasound to evaluate the cervix.  The cervical canal is dilated with a residual closed portion of  cervix measuring 4 millimeters.  Cervical shortening and polyhydramnios increase the risk of  preterm delivery. ---------------------------------------------------------------------- Recommendations  -Twice-weekly BPP (Mon-Thu). ----------------------------------------------------------------------                  Tama High, MD Electronically Signed Final Report   10/20/2019 10:41 am ----------------------------------------------------------------------  ECHOCARDIOGRAM COMPLETE  Result Date: 10/22/2019    ECHOCARDIOGRAM REPORT   Patient Name:   [facility] Ms Fahl Date of Exam: 10/22/2019 Medical Rec #:  [id number]     Height:  60.0 in Accession #:    [id number]    Weight:       190.1 lb Date of Birth:  [birth year]     BSA:          1.827 m Patient Age:    23 years      BP:           150/98 mmHg Patient Gender: F             HR:           89 bpm. Exam Location:  Inpatient Procedure: 2D Echo, Cardiac Doppler and Color Doppler Indications:    I51.7 Cardiomegaly  History:        Patient has no prior history of Echocardiogram examinations.                 Risk Factors:Diabetes. CKD. Preeclampsia.  Sonographer:    Jonelle Sidle Dance Referring Phys: Shirley  1. Left ventricular ejection fraction, by estimation, is 55 to 60%. The left  ventricle has normal function. The left ventricle has no regional wall motion abnormalities. There is mild concentric left ventricular hypertrophy. Indeterminate diastolic filling due to E-A fusion.  2. Right ventricular systolic function is normal. The right ventricular size is normal. Tricuspid regurgitation signal is inadequate for assessing PA pressure.  3. Moderate pericardial effusion. The pericardial effusion is circumferential. There is no evidence of cardiac tamponade.  4. The mitral valve is grossly normal. No evidence of mitral valve regurgitation.  5. The aortic valve is tricuspid. Aortic valve regurgitation is not visualized. No aortic stenosis is present.  6. The inferior vena cava is normal in size with greater than 50% respiratory variability, suggesting right atrial pressure of 3 mmHg. FINDINGS  Left Ventricle: Left ventricular ejection fraction, by estimation, is 55 to 60%. The left ventricle has normal function. The left ventricle has no regional wall motion abnormalities. The left ventricular internal cavity size was normal in size. There is  mild concentric left ventricular hypertrophy. Indeterminate diastolic filling due to E-A fusion. Right Ventricle: The right ventricular size is normal. No increase in right ventricular wall thickness. Right ventricular systolic function is normal. Tricuspid regurgitation signal is inadequate for assessing PA pressure. Left Atrium: Left atrial size was normal in size. Right Atrium: Right atrial size was normal in size. Pericardium: A moderately sized pericardial effusion is present. The pericardial effusion is circumferential. There is no evidence of cardiac tamponade. Mitral Valve: The mitral valve is grossly normal. No evidence of mitral valve regurgitation. Tricuspid Valve: The tricuspid valve is grossly normal. Tricuspid valve regurgitation is trivial. Aortic Valve: The aortic valve is tricuspid. Aortic valve regurgitation is not visualized. No aortic  stenosis is present. Pulmonic Valve: The pulmonic valve was grossly normal. Pulmonic valve regurgitation is trivial. Aorta: The aortic root and ascending aorta are structurally normal, with no evidence of dilitation. Venous: The inferior vena cava is normal in size with greater than 50% respiratory variability, suggesting right atrial pressure of 3 mmHg. IAS/Shunts: No atrial level shunt detected by color flow Doppler.  LEFT VENTRICLE PLAX 2D LVIDd:         4.90 cm  Diastology LVIDs:         3.30 cm  LV e' lateral:   10.70 cm/s LV PW:         1.10 cm  LV E/e' lateral: 10.2 LV IVS:        1.00 cm  LV e' medial:    5.91  cm/s LVOT diam:     1.90 cm  LV E/e' medial:  18.4 LV SV:         66 LV SV Index:   36 LVOT Area:     2.84 cm  RIGHT VENTRICLE             IVC RV Basal diam:  2.20 cm     IVC diam: 1.50 cm RV S prime:     13.70 cm/s TAPSE (M-mode): 2.1 cm LEFT ATRIUM             Index       RIGHT ATRIUM           Index LA diam:        3.60 cm 1.97 cm/m  RA Area:     13.30 cm LA Vol (A2C):   72.5 ml 39.69 ml/m RA Volume:   31.00 ml  16.97 ml/m LA Vol (A4C):   48.3 ml 26.44 ml/m LA Biplane Vol: 59.2 ml 32.41 ml/m  AORTIC VALVE LVOT Vmax:   125.00 cm/s LVOT Vmean:  79.150 cm/s LVOT VTI:    0.234 m  AORTA Ao Root diam: 3.10 cm Ao Asc diam:  2.70 cm MITRAL VALVE MV Area (PHT): 2.58 cm     SHUNTS MV Decel Time: 294 msec     Systemic VTI:  0.23 m MV E velocity: 109.00 cm/s  Systemic Diam: 1.90 cm Eleonore Chiquito MD Electronically signed by Eleonore Chiquito MD Signature Date/Time: 10/22/2019/6:34:46 PM    Final    {   Assessment and Plan:   1. Pre-eclampsia with IOL at 50w4dc/b postpartum hemorrhage requiring D&E, with gestational HTN preceding admit   2. Cardiac enlargement on CXR, echo showing moderate pericardial effusion  3. Recent anemia requiring several transfusions,ongoing issues with anemia noted  4. AKI on CKD stage III with nephrotic range proteinuria (and hypoalbuminemia 1.6), being followed by  nephrology.  5. Type 1 DM  Will review echo and vitals with MD, further plans pending per our discussion. Patient without clinical signs of tamponade.  For questions or updates, please contact CSenecaPlease consult [email or web address] for contact info under    Signed, BCarlyle Dolly MD  10/22/2019 9:40 PM   Attending Note  Patient seen and discussed with PA Dunn, I agree with her documentation above. 24yo female no prior cardiac history. Labor induced 10/21/19 due to pregnancy complicated by preeclampsia with nephrotic range proteinuria and worsening HTN. Started on procardia XL. Issues with AKI on CKD, anemia requiring transfusion. Cardiology consulted for cardiomegaly noted on CXR     Cr 1.9 BUN 14 Mg 5.5 WBC 10.6 Hgb 9.3 Plt 151 CXR marked enlargement cardiac silhoutte,  EKG SR, no ischemic changes Echo LVEF 55-60%, normal RV, cannot eval diastolic function, moderate pericardial effusion without tamponade.   Moderate pericardial effusion without tamponade physiology. No significant uremia. Obtain TSH/ESR/CRP. From literature review pericardial effusion has been described in the setting of preeclampsia as the possible etiology. Would repeat echo in 48 hours to evalute for any progressoin. Dont seen reason for autoimmune workup at this time.    Defer HTN management of preeclampsia ob/gyn. She has received IV lasix '20mg'$  x 1 today, follow diuresis. Any volume overload was likely due to volume from transfusions and severe afterload given HTN with her preeclampsia, she does have have significant cardiac dysfunction.    JCarlyle DollyMD

## 2019-10-22 NOTE — Brief Op Note (Signed)
10/22/2019  2:16 PM  PATIENT:  Kathy Lewis  24 y.o. female  PRE-OPERATIVE DIAGNOSIS:  Post partum hemorrhage, s/p SVD, Preeclampsia with severe features  POST-OPERATIVE DIAGNOSIS:  Post partum hemorrhage  PROCEDURE:  Procedure(s): ULTRASOUND GUIDED DILATATION AND EVACUATION (N/A)  Bakri Balloon placement attempted  SURGEON:  Surgeon(s) and Role:    [name], MD - Primary  PHYSICIAN ASSISTANT: None  ASSISTANTS: Technician  ANESTHESIA:   epidural   IVF: 250 ml   UOP: 50 ml  EBL:  50 ml   BLOOD ADMINISTERED:none  DRAINS: Urinary Catheter (Foley)   LOCAL MEDICATIONS USED:  NONE  SPECIMEN:  Source of Specimen:  endometrial currettings  DISPOSITION OF SPECIMEN:  PATHOLOGY  COUNTS:  YES  TOURNIQUET:  * No tourniquets in log *  DICTATION: .Other Dictation: Dictation Number [id number]  PLAN OF CARE: Transfer to [place] after PACU  PATIENT DISPOSITION:  PACU - hemodynamically stable.   Delay start of Pharmacological VTE agent (>24hrs) due to surgical blood loss or risk of bleeding: yes

## 2019-10-22 NOTE — Progress Notes (Signed)
Dr. Jillyn Hidden at bedside reviewing BP's. Pt feeling nauseous. Order received to give Phenyephine 80 mcg IVP x1.

## 2019-10-22 NOTE — Progress Notes (Signed)
Dr. Alesia Richards at bedside. Pt feeling nauseous. Dr. Alesia Richards reviewed vital signs. MD requests for additional dose of Phenylephrine be given at this time.

## 2019-10-22 NOTE — Progress Notes (Signed)
Dr. Alesia Richards notified of pt's current BP and oxygen saturation of 89-92 with pt sleeping. Current oxygen saturation of 96% after RN encouraged pt to take slow deep breaths.

## 2019-10-22 NOTE — Progress Notes (Signed)
Patient ID: Kathy Lewis, female   DOB: 26-Jan-1996, 24 y.o.   MRN: [id number]   Subjective: Pt reports she is doing well.  The blurry vision has resolved but she still has scotomata.  Denies SOB, headache, upper abdominal pain.  She reports she rested well overnight. Pt reports she had bruising on her arm several days ago.  Objective: I have reviewed patient's vital signs, intake and output, medications and labs. Vitals: Vitals:   10/22/19 0717 10/22/19 0733 10/22/19 0747 10/22/19 0831  BP: 112/66 106/60 102/61 125/82  Pulse: 86 96 100 87  Resp: 18 18    Temp:      TempSrc:      SpO2:  100%  98%  Weight:      Height:      133/87 currently  Intake/Output Summary (Last 24 hours) at 10/22/2019 0834 Last data filed at 10/22/2019 0815 Gross per 24 hour  Intake 3902.41 ml  Output 1955 ml  Net 1947.41 ml   UOP hourly starting with the most recent.:  40/22/70/30/24/90 IVF avg ~ 120 ml/1 hr  Pitocin 28.5 ml/1 hr (19 mUs) D5LR 50 ml Insulin 1.1 ml Magnesium 12.5 ml (discontinued) LR 40 ml  Total ~ 120 (without Magnesium)  General: alert, cooperative and no distress Resp: clear to auscultation bilaterally GI: soft, non-tender; bowel sounds normal; no masses,  no organomegaly.  Gravid uterus Extremities: Edematous to hip Skin:  Bruising on left arm ~8 cm diameter, bruising on upper left buttock. EFM: 110 baseline, moderate variability, reactive TOCO: Irregular contractions.  Category 1 tracing CVX: 3/80/0, Head well applied to cervix. IUPC placed.   Labs:  1)  Ref Range & Units 00:17 1 d ago 9 d ago  Magnesium 1.7 - 2.4 mg/dL 6.2High Panic   5.2High  CM  1.8 CM        Status:  Final result Visible to patient:  No (scheduled for 10/22/2019 2:02 AM) Next appt:  10/28/2019 at 08:30 AM in Internal Medicine ([facility])  Ref Range & Units 01:01 00:06 1 d ago 1 d ago  Glucose-Capillary 70 - 99 mg/dL 98  94 CM  91 CM  105High  CM        CBC Latest Ref Rng & Units 10/22/2019  10/21/2019 10/21/2019  WBC 4.0 - 10.5 K/uL 9.8 9.2 8.9  Hemoglobin 12.0 - 15.0 g/dL 9.1(L) 9.1(L) 8.7(L)  Hematocrit 36.0 - 46.0 % 27.3(L) 26.9(L) 25.1(L)  Platelets 150 - 400 K/uL 150 146(L) 138(L)   CMP Latest Ref Rng & Units 10/22/2019 10/21/2019 10/21/2019  Glucose 70 - 99 mg/dL 165(H) 160(H) 99  BUN 6 - 20 mg/dL 16 16 18   Creatinine 0.44 - 1.00 mg/dL 1.75(H) 1.68(H) 1.59(H)  Sodium 135 - 145 mmol/L 134(L) 134(L) 137  Potassium 3.5 - 5.1 mmol/L 3.9 4.0 3.9  Chloride 98 - 111 mmol/L 105 106 108  CO2 22 - 32 mmol/L 21(L) 21(L) 20(L)  Calcium 8.9 - 10.3 mg/dL 7.3(L) 7.7(L) 7.8(L)  Total Protein 6.5 - 8.1 g/dL 4.9(L) 4.9(L) 4.6(L)  Total Bilirubin 0.3 - 1.2 mg/dL 0.7 0.3 0.9  Alkaline Phos 38 - 126 U/L 62 64 60  AST 15 - 41 U/L 20 18 18   ALT 0 - 44 U/L 13 11 13     CHEST X RAY: ADDENDUM REPORT: 10/21/2019 21:05  ADDENDUM: Correction: The impression should read: "Marked enlargement of the cardiac silhouette. Cardiomegaly versus pericardial effusion. The possibility of cardiomyopathy should be considered. Slight atelectasis at the left base posterior medially.  Assessment/Plan:  24 y/o G2P0010 at 30 weeks 5 days EGA being induced for preeclampsia with severe features on pitocin, with epidural  Labor  Continue Pitocin until adequate labor pattern.  Anticipate vaginal delivery.  Category I tracing overall with low baseline.  Cesarean section for obstetric indications only.  Preeclampsia with severe features  -BP is normal  To low, labs normal.  Discontinued magnesium due to decreased UOP and level  above goal of 5.0.  -Repeat labs at noon.  Type I DM-controlled  Discontinue NPH.  Use IV insulin only.  CKD Stage 3  Strict I/Os.  Avoid hypotension.  Discontinued Aldomet.  Consider decreasing Procardia XL from 90 mg to 60  Mg.  Give and additional 30 mg XL if BP trends up.  Possible Cardiomegaly vs. Pericardial effusion.  Consult cardiology after delivery for cardiac  echo and recommendations  Will likely need Lasix postpartum.  Bruising  Check Coags per Dr. Annamaria Boots.  Discussed with Dr. Annamaria Boots and Royce Macadamia.   Notes to follow.   Meridee Score, MD  cell- [phone]

## 2019-10-22 NOTE — Progress Notes (Signed)
RN discussed current orders for PO BP medications with Grice,CNM who is present on L&D unit. Orders received to hold PO medications (Aldomet and Metoclopramide) at this time.

## 2019-10-22 NOTE — Progress Notes (Signed)
CRITICAL VALUE ALERT  Critical Value:  Hemoglobin 6.6  Date & Time Notied: 10/22/19 @2235   Provider Notified: Dr. Simona Huh  Orders Received/Actions taken: Will give 1 unit of blood.

## 2019-10-23 ENCOUNTER — Encounter: Payer: Self-pay | Admitting: *Deleted

## 2019-10-23 LAB — CBC
HCT: 21.2 % — ABNORMAL LOW (ref 36.0–46.0)
HCT: 23.6 % — ABNORMAL LOW (ref 36.0–46.0)
Hemoglobin: 7.2 g/dL — ABNORMAL LOW (ref 12.0–15.0)
Hemoglobin: 8.1 g/dL — ABNORMAL LOW (ref 12.0–15.0)
MCH: 31.6 pg (ref 26.0–34.0)
MCH: 30.8 pg (ref 26.0–34.0)
MCHC: 34 g/dL (ref 30.0–36.0)
MCHC: 34.3 g/dL (ref 30.0–36.0)
MCV: 93 fL (ref 80.0–100.0)
MCV: 89.7 fL (ref 80.0–100.0)
Platelets: 133 10*3/uL — ABNORMAL LOW (ref 150–400)
Platelets: 157 10*3/uL (ref 150–400)
RBC: 2.28 MIL/uL — ABNORMAL LOW (ref 3.87–5.11)
RBC: 2.63 MIL/uL — ABNORMAL LOW (ref 3.87–5.11)
RDW: 14.3 % (ref 11.5–15.5)
RDW: 15.2 % (ref 11.5–15.5)
WBC: 13.9 10*3/uL — ABNORMAL HIGH (ref 4.0–10.5)
WBC: 13 10*3/uL — ABNORMAL HIGH (ref 4.0–10.5)
nRBC: 0 % (ref 0.0–0.2)
nRBC: 0 % (ref 0.0–0.2)

## 2019-10-23 LAB — BPAM RBC
Blood Product Expiration Date: 202103052359
Blood Product Expiration Date: 202103082359
Blood Product Expiration Date: 202103092359
Blood Product Expiration Date: 202103132359
Blood Product Expiration Date: 202103132359
ISSUE DATE / TIME: 202102221354
ISSUE DATE / TIME: 202102241235
ISSUE DATE / TIME: 202102242309
Unit Type and Rh: 9500
Unit Type and Rh: 9500
Unit Type and Rh: 9500
Unit Type and Rh: 9500
Unit Type and Rh: 9500

## 2019-10-23 LAB — PREPARE FRESH FROZEN PLASMA

## 2019-10-23 LAB — COMPREHENSIVE METABOLIC PANEL
ALT: 14 U/L (ref 0–44)
AST: 22 U/L (ref 15–41)
Albumin: 1.5 g/dL — ABNORMAL LOW (ref 3.5–5.0)
Alkaline Phosphatase: 61 U/L (ref 38–126)
Anion gap: 7 (ref 5–15)
BUN: 14 mg/dL (ref 6–20)
CO2: 22 mmol/L (ref 22–32)
Calcium: 6.9 mg/dL — ABNORMAL LOW (ref 8.9–10.3)
Chloride: 106 mmol/L (ref 98–111)
Creatinine, Ser: 1.77 mg/dL — ABNORMAL HIGH (ref 0.44–1.00)
GFR calc Af Amer: 46 mL/min — ABNORMAL LOW (ref >60)
GFR calc non Af Amer: 40 mL/min — ABNORMAL LOW (ref >60)
Glucose, Bld: 98 mg/dL (ref 70–99)
Potassium: 4.2 mmol/L (ref 3.5–5.1)
Sodium: 135 mmol/L (ref 135–145)
Total Bilirubin: 0.4 mg/dL (ref 0.3–1.2)
Total Protein: 3.7 g/dL — ABNORMAL LOW (ref 6.5–8.1)

## 2019-10-23 LAB — TYPE AND SCREEN
ABO/RH(D): O NEG
Antibody Screen: POSITIVE
Unit division: 0
Unit division: 0
Unit division: 0
Unit division: 0
Unit division: 0

## 2019-10-23 LAB — BPAM FFP
Blood Product Expiration Date: 202103012359
Blood Product Expiration Date: 202103012359
ISSUE DATE / TIME: 202102241358
ISSUE DATE / TIME: 202102241358
Unit Type and Rh: 5100
Unit Type and Rh: 5100

## 2019-10-23 LAB — GLUCOSE, CAPILLARY
Glucose-Capillary: 105 mg/dL — ABNORMAL HIGH (ref 70–99)
Glucose-Capillary: 106 mg/dL — ABNORMAL HIGH (ref 70–99)
Glucose-Capillary: 129 mg/dL — ABNORMAL HIGH (ref 70–99)
Glucose-Capillary: 80 mg/dL (ref 70–99)
Glucose-Capillary: 92 mg/dL (ref 70–99)
Glucose-Capillary: 95 mg/dL (ref 70–99)
Glucose-Capillary: 99 mg/dL (ref 70–99)

## 2019-10-23 LAB — SEDIMENTATION RATE: Sed Rate: 49 mm/hr — ABNORMAL HIGH (ref 0–22)

## 2019-10-23 LAB — MAGNESIUM: Magnesium: 6 mg/dL — ABNORMAL HIGH (ref 1.7–2.4)

## 2019-10-23 LAB — SAVE SMEAR(SSMR), FOR PROVIDER SLIDE REVIEW

## 2019-10-23 LAB — TSH: TSH: 1.756 u[IU]/mL (ref 0.350–4.500)

## 2019-10-23 MED ORDER — INSULIN ASPART 100 UNIT/ML ~~LOC~~ SOLN
2.0000 [IU] | Freq: Every day | SUBCUTANEOUS | Status: DC
  Administered 2019-10-27: 2 [IU] via SUBCUTANEOUS

## 2019-10-23 MED ORDER — INSULIN ASPART 100 UNIT/ML ~~LOC~~ SOLN
0.0000 [IU] | Freq: Three times a day (TID) | SUBCUTANEOUS | Status: DC
  Administered 2019-10-26: 2 [IU] via SUBCUTANEOUS

## 2019-10-23 MED ORDER — INSULIN ASPART 100 UNIT/ML ~~LOC~~ SOLN
3.0000 [IU] | Freq: Every day | SUBCUTANEOUS | Status: DC
  Administered 2019-10-23 – 2019-10-24 (×2): 3 [IU] via SUBCUTANEOUS

## 2019-10-23 MED ORDER — INSULIN ASPART 100 UNIT/ML ~~LOC~~ SOLN
3.0000 [IU] | Freq: Every day | SUBCUTANEOUS | Status: DC
  Administered 2019-10-23 – 2019-10-26 (×2): 3 [IU] via SUBCUTANEOUS

## 2019-10-23 MED ORDER — NIFEDIPINE ER OSMOTIC RELEASE 30 MG PO TB24
90.0000 mg | ORAL_TABLET | Freq: Every day | ORAL | Status: DC
  Administered 2019-10-24 – 2019-10-25 (×2): 90 mg via ORAL
  Filled 2019-10-23 (×2): qty 3

## 2019-10-23 NOTE — Lactation Note (Signed)
This note was copied from a baby's chart. Lactation Consultation Note  Patient Name: Kathy Lewis [id number] Date: 10/23/2019 Reason for consult: Initial assessment;1st time breastfeeding;NICU baby;Preterm <34wks;Primapara P1.  Baby is 30.6 weeks in the NICU.  Mom has been set up with the symphony pump.  Discussed milk coming to volume.  Instructed to pump 8 times in 24 hours.  Mom has a pump for home use.  Breastfeeding consultation services discussed and information given.  Providing Breastmilk For Your Infant In NICU booklet given for review.  Mom states she is comfortable with pump and has no questions.  Encouraged to call with concerns prn.  Maternal Data    Feeding    LATCH Score                   Interventions    Lactation Tools Discussed/Used     Consult Status Consult Status: Follow-up Date: 10/24/19 Follow-up type: In-patient    Ave Filter 10/23/2019, 8:17 AM

## 2019-10-23 NOTE — Plan of Care (Signed)
  Problem: Education: Goal: Knowledge of disease or condition will improve Outcome: Progressing Goal: Knowledge of the prescribed therapeutic regimen will improve Outcome: Progressing   Problem: Clinical Measurements: Goal: Complications related to disease process, condition or treatment will be avoided or minimized Outcome: Progressing   Problem: Clinical Measurements: Goal: Ability to maintain clinical measurements within normal limits will improve Outcome: Progressing   Problem: Nutrition: Goal: Adequate nutrition will be maintained Outcome: Progressing

## 2019-10-23 NOTE — Progress Notes (Signed)
Per Bronson Curb, diabetes coordinator: Do not administer scheduled meal novolog plus sliding scale doses unless they are at least 4 hours apart, pt's blood sugar is above 80 and patient has eaten at least 50% of her meal.

## 2019-10-23 NOTE — Anesthesia Postprocedure Evaluation (Signed)
Anesthesia Post Note  Patient: Kathy Lewis  Procedure(s) Performed: ULTRASOUND GUIDED DILATATION AND EVACUATION (N/A Vagina )     Patient location during evaluation: PACU Anesthesia Type: Epidural Level of consciousness: oriented and awake and alert Pain management: pain level controlled Vital Signs Assessment: post-procedure vital signs reviewed and stable Respiratory status: spontaneous breathing, respiratory function stable and nonlabored ventilation Cardiovascular status: blood pressure returned to baseline and stable Postop Assessment: no headache, no backache, no apparent nausea or vomiting and epidural receding Anesthetic complications: no    Last Vitals:  Vitals:   10/23/19 0655 10/23/19 0728  BP: (!) 157/92   Pulse: 97   Resp:  18  Temp:    SpO2:      Last Pain:  Vitals:   10/23/19 0728  TempSrc:   PainSc: 0-No pain                 Lidia Collum

## 2019-10-23 NOTE — Progress Notes (Signed)
Pt up to NICU via wheelchair accompanied by RN and her significant other. Pt in stable condition on IV magnesium.

## 2019-10-23 NOTE — Anesthesia Postprocedure Evaluation (Signed)
Anesthesia Post Note  Patient: Anberlin Piel  Procedure(s) Performed: AN AD [place]     Patient location during evaluation: Mother Baby Anesthesia Type: Epidural Level of consciousness: awake and alert Pain management: pain level controlled Vital Signs Assessment: post-procedure vital signs reviewed and stable Respiratory status: spontaneous breathing, nonlabored ventilation and respiratory function stable Cardiovascular status: stable Postop Assessment: no headache, no backache and epidural receding Anesthetic complications: no    Last Vitals:  Vitals:   10/23/19 0655 10/23/19 0728  BP: (!) 157/92   Pulse: 97   Resp:  18  Temp:    SpO2:      Last Pain:  Vitals:   10/23/19 0728  TempSrc:   PainSc: 0-No pain   Pain Goal: Patients Stated Pain Goal: 3 (10/21/19 2100)              Epidural/Spinal Function Cutaneous sensation: Able to Wiggle Toes (10/23/19 [id number]), Patient able to flex knees: Yes (10/23/19 0728), Patient able to lift hips off bed: Yes (10/23/19 0728), Back pain beyond tenderness at insertion site: No (10/23/19 0728), Progressively worsening motor and/or sensory loss: No (10/23/19 0728), Bowel and/or bladder incontinence post epidural: No (10/23/19 0728)  Barkley Boards

## 2019-10-23 NOTE — Progress Notes (Signed)
Inpatient Diabetes Program Recommendations  AACE/ADA: New Consensus Statement on Inpatient Glycemic Control (2015)  Target Ranges:  Prepandial:   less than 140 mg/dL      Peak postprandial:   less than 180 mg/dL (1-2 hours)      Critically ill patients:  140 - 180 mg/dL   Lab Results  Component Value Date   GLUCAP 106 (H) 10/23/2019   HGBA1C 12.1 (H) 12/18/2014    Review of Glycemic Control Results for MILCAH, PLUFF (MRN [id number]) as of 10/23/2019 08:34  Ref. Range 10/23/2019 00:12 10/23/2019 03:55 10/23/2019 05:31 10/23/2019 08:08  Glucose-Capillary Latest Ref Range: 70 - 99 mg/dL 105 (H) 80 92 106 (H)   Diabetes history: Type 1 DM Outpatient Diabetes medications: Novolog 5-15 units TID, NPH 20 units BID Pre pregnant- Lantus 30 units QHS, Humalog 06/06/14 TID Current orders for Inpatient glycemic control: Levemir 12 units QD, Novolog 0-6 units Q4H BMZ x 2 on 2/17 & 2/18  Inpatient Diabetes Program Recommendations:    AM glucose trending well.   Patient denies eating a meal since OR yesterday. Per RN, patient preparing to order breakfast.   If oral intake to improve today, consider:  -Changing correction to Novolog 0-6 TID.  -Adding Novolog 2 units with breakfast & Novolog 3 units with lunch and dinner (assuming patient is consuming >50% of meal).   Anticipate need for further titration once patient is consuming more at meals.  Thanks, Bronson Curb, MSN, RNC-OB Diabetes Coordinator [phone] (8a-5p)

## 2019-10-23 NOTE — Progress Notes (Signed)
Nephrology Progress Note:   Patient ID: Kathy Lewis, female   DOB: 1996/04/06, 24 y.o.   MRN: [id number]  S:  Delivered yesterday.  "I feel much better".  Received multiple units of blood products with acute blood loss.  Cardiology was consulted for cardiomegaly.  On echo noted to have pericardial effusion without tamponade; LVEF 55-60%.  She had 3.6 liters UOP over 2/24 and has 2.3 liters charted thus far on 2/25.  Received lasix x 2 doses overnight and one earlier in the day.      Review of systems   Denies n/v Denies shortness of breath or chest pain  Denies dizziness  Vision is better - not normal yet  O:BP (!) 142/74 (BP Location: Right Arm)   Pulse 93   Temp 98.8 F (37.1 C) (Oral)   Resp 18   Ht 5' (1.524 m)   Wt 86.2 kg   LMP 03/14/2019 (Exact Date)   SpO2 95%   Breastfeeding Unknown   BMI 37.13 kg/m   Intake/Output Summary (Last 24 hours) at 10/23/2019 1540 Last data filed at 10/23/2019 1532 Gross per 24 hour  Intake 3725.54 ml  Output 5732 ml  Net -2006.46 ml   Intake/Output: I/O last 3 completed shifts: In: 5595.3 [P.O.:1570; I.V.:3561.3; Blood:464] Out: [id number]; Emesis/NG output:250; [id number]  Intake/Output this shift:  Total I/O In: 1123.2 [P.O.:720; I.V.:403.2] Out: 2325 [Urine:2325] Weight change:    Physical exam:   Gen: adult female NAD  CVS: S1S2 no rub Resp: cta unlabored   Abd: nontender postpartum  Ext: 1-2+ lower extremity edema bilaterally Psych normal mood and affect.  Neuro - alert and oriented x 3; follows commands and provides history  Recent Labs  Lab 10/20/19 0622 10/21/19 0536 10/21/19 1721 10/22/19 0537 10/22/19 1207 10/22/19 2208 10/23/19 0610  NA 137 137 134* 134* 135 136 135  K 4.1 3.9 4.0 3.9 3.9 4.1 4.2  CL 109 108 106 105 104 107 106  CO2 20* 20* 21* 21* 22 22 22   GLUCOSE 78 99 160* 165* 103* 111* 98  BUN 20 18 16 16 14 15 14   CREATININE 1.67* 1.59* 1.68* 1.75* 1.90* 1.71* 1.77*  ALBUMIN 1.3* 1.6* 1.6*  1.6* 1.6* 1.4* 1.5*  CALCIUM 7.7* 7.8* 7.7* 7.3* 7.3* 6.9* 6.9*  AST 18 18 18 20 23 21 22   ALT 13 13 11 13 12 13 14    Liver Function Tests: Recent Labs  Lab 10/22/19 1207 10/22/19 2208 10/23/19 0610  AST 23 21 22   ALT 12 13 14   ALKPHOS 72 58 61  BILITOT 0.5 0.5 0.4  PROT 4.8* 4.0* 3.7*  ALBUMIN 1.6* 1.4* 1.5*   CBC: Recent Labs  Lab 10/19/19 1718 10/20/19 0622 10/22/19 0537 10/22/19 0537 10/22/19 1207 10/22/19 1207 10/22/19 1713 10/22/19 2208 10/23/19 0610  WBC 9.3   < > 9.8   < > 10.6*   < > 13.9* 12.5* 13.0*  NEUTROABS 6.3  --   --   --   --   --   --   --   --   HGB 8.2*   < > 9.1*   < > 9.3*   < > 7.2* 6.6* 8.1*  HCT 24.1*   < > 27.3*   < > 27.8*   < > 21.2* 18.8* 23.6*  MCV 93.1   < > 92.9  --  92.4  --  93.0 90.0 89.7  PLT 127*   < > 150   < > 151   < >  133* 133* 157   < > = values in this interval not displayed.   Cardiac Enzymes: No results for input(s): CKTOTAL, CKMB, CKMBINDEX, TROPONINI in the last 168 hours. CBG: Recent Labs  Lab 10/23/19 0355 10/23/19 0531 10/23/19 0808 10/23/19 1149 10/23/19 1442  GLUCAP 80 92 106* 129* 99    Iron Studies:  No results for input(s): IRON, TIBC, TRANSFERRIN, FERRITIN in the last 72 hours. Studies/Results: [place] Intraoperative  Result Date: 10/22/2019 CLINICAL DATA:  Ultrasound was provided for use by the ordering physician, and a technical charge was applied by the performing facility.  No radiologist interpretation/professional services rendered.   DG Chest Port 1 View  Addendum Date: 10/21/2019   ADDENDUM REPORT: 10/21/2019 21:05 ADDENDUM: Correction: The impression should read: "Marked enlargement of the cardiac silhouette. Cardiomegaly versus pericardial effusion. The possibility of cardiomyopathy should be considered. Slight atelectasis at the left base posterior medially. The" Electronically Signed   By: Lorriane Shire M.D.   On: 10/21/2019 21:05   Result Date: 10/21/2019 CLINICAL DATA:  Severe  preeclampsia. EXAM: PORTABLE CHEST 1 VIEW COMPARISON:  10/16/2018 FINDINGS: There is marked enlargement of the cardiac silhouette with a globular configuration. This could represent global cardiomegaly or a pericardial effusion. Slight distention of the azygos vein. No discrete infiltrates or effusions. No bone abnormality. IMPRESSION: Marked enlargement of the cardiac silhouette. Cardiomegaly versus pericardial effusion. Electronically Signed: By: Lorriane Shire M.D. On: 10/21/2019 20:26   ECHOCARDIOGRAM COMPLETE  Result Date: 10/22/2019    ECHOCARDIOGRAM REPORT   Patient Name:   Kathy Lewis Date of Exam: 10/22/2019 Medical Rec #:  [id number]     Height:       60.0 in Accession #:    [id number]    Weight:       190.1 lb Date of Birth:  May 09, 1996     BSA:          1.827 m Patient Age:    23 years      BP:           150/98 mmHg Patient Gender: F             HR:           89 bpm. Exam Location:  Inpatient Procedure: 2D Echo, Cardiac Doppler and Color Doppler Indications:    I51.7 Cardiomegaly  History:        Patient has no prior history of Echocardiogram examinations.                 Risk Factors:Diabetes. CKD. Preeclampsia.  Sonographer:    Jonelle Sidle Dance Referring Phys: [place]  1. Left ventricular ejection fraction, by estimation, is 55 to 60%. The left ventricle has normal function. The left ventricle has no regional wall motion abnormalities. There is mild concentric left ventricular hypertrophy. Indeterminate diastolic filling due to E-A fusion.  2. Right ventricular systolic function is normal. The right ventricular size is normal. Tricuspid regurgitation signal is inadequate for assessing PA pressure.  3. Moderate pericardial effusion. The pericardial effusion is circumferential. There is no evidence of cardiac tamponade.  4. The mitral valve is grossly normal. No evidence of mitral valve regurgitation.  5. The aortic valve is tricuspid. Aortic valve regurgitation is not  visualized. No aortic stenosis is present.  6. The inferior vena cava is normal in size with greater than 50% respiratory variability, suggesting right atrial pressure of 3 mmHg. FINDINGS  Left Ventricle: Left ventricular ejection fraction, by estimation, is 55 to 60%.  The left ventricle has normal function. The left ventricle has no regional wall motion abnormalities. The left ventricular internal cavity size was normal in size. There is  mild concentric left ventricular hypertrophy. Indeterminate diastolic filling due to E-A fusion. Right Ventricle: The right ventricular size is normal. No increase in right ventricular wall thickness. Right ventricular systolic function is normal. Tricuspid regurgitation signal is inadequate for assessing PA pressure. Left Atrium: Left atrial size was normal in size. Right Atrium: Right atrial size was normal in size. Pericardium: A moderately sized pericardial effusion is present. The pericardial effusion is circumferential. There is no evidence of cardiac tamponade. Mitral Valve: The mitral valve is grossly normal. No evidence of mitral valve regurgitation. Tricuspid Valve: The tricuspid valve is grossly normal. Tricuspid valve regurgitation is trivial. Aortic Valve: The aortic valve is tricuspid. Aortic valve regurgitation is not visualized. No aortic stenosis is present. Pulmonic Valve: The pulmonic valve was grossly normal. Pulmonic valve regurgitation is trivial. Aorta: The aortic root and ascending aorta are structurally normal, with no evidence of dilitation. Venous: The inferior vena cava is normal in size with greater than 50% respiratory variability, suggesting right atrial pressure of 3 mmHg. IAS/Shunts: No atrial level shunt detected by color flow Doppler.  LEFT VENTRICLE PLAX 2D LVIDd:         4.90 cm  Diastology LVIDs:         3.30 cm  LV e' lateral:   10.70 cm/s LV PW:         1.10 cm  LV E/e' lateral: 10.2 LV IVS:        1.00 cm  LV e' medial:    5.91 cm/s LVOT  diam:     1.90 cm  LV E/e' medial:  18.4 LV SV:         66 LV SV Index:   36 LVOT Area:     2.84 cm  RIGHT VENTRICLE             IVC RV Basal diam:  2.20 cm     IVC diam: 1.50 cm RV S prime:     13.70 cm/s TAPSE (M-mode): 2.1 cm LEFT ATRIUM             Index       RIGHT ATRIUM           Index LA diam:        3.60 cm 1.97 cm/m  RA Area:     13.30 cm LA Vol (A2C):   72.5 ml 39.69 ml/m RA Volume:   31.00 ml  16.97 ml/m LA Vol (A4C):   48.3 ml 26.44 ml/m LA Biplane Vol: 59.2 ml 32.41 ml/m  AORTIC VALVE LVOT Vmax:   125.00 cm/s LVOT Vmean:  79.150 cm/s LVOT VTI:    0.234 m  AORTA Ao Root diam: 3.10 cm Ao Asc diam:  2.70 cm MITRAL VALVE MV Area (PHT): 2.58 cm     SHUNTS MV Decel Time: 294 msec     Systemic VTI:  0.23 m MV E velocity: 109.00 cm/s  Systemic Diam: 1.90 cm Eleonore Chiquito MD Electronically signed by Eleonore Chiquito MD Signature Date/Time: 10/22/2019/6:34:46 PM    Final    . sodium chloride   Intravenous Once  . sodium chloride   Intravenous Once  . escitalopram  15 mg Oral Daily  . insulin aspart  0-6 Units Subcutaneous TID WC  . [START ON 10/24/2019] insulin aspart  2 Units Subcutaneous Q breakfast  . insulin aspart  3 Units Subcutaneous Q lunch  . insulin aspart  3 Units Subcutaneous Q supper  . insulin detemir  12 Units Subcutaneous Daily  . metroNIDAZOLE  500 mg Oral Q12H  . NIFEdipine  60 mg Oral Daily  . prenatal multivitamin  1 tablet Oral Q1200  . senna-docusate  2 tablet Oral Q24H  . Tdap  0.5 mL Intramuscular Once    BMET    Component Value Date/Time   NA 135 10/23/2019 0610   K 4.2 10/23/2019 0610   CL 106 10/23/2019 0610   CO2 22 10/23/2019 0610   GLUCOSE 98 10/23/2019 0610   BUN 14 10/23/2019 0610   CREATININE 1.77 (H) 10/23/2019 0610   CALCIUM 6.9 (L) 10/23/2019 0610   GFRNONAA 40 (L) 10/23/2019 0610   GFRAA 46 (L) 10/23/2019 0610   CBC    Component Value Date/Time   WBC 13.0 (H) 10/23/2019 0610   RBC 2.63 (L) 10/23/2019 0610   HGB 8.1 (L) 10/23/2019 0610    HCT 23.6 (L) 10/23/2019 0610   HCT 23.0 (L) 10/18/2019 0534   PLT 157 10/23/2019 0610   MCV 89.7 10/23/2019 0610   MCH 30.8 10/23/2019 0610   MCHC 34.3 10/23/2019 0610   RDW 15.2 10/23/2019 0610   LYMPHSABS 2.2 10/19/2019 1718   MONOABS 0.6 10/19/2019 1718   EOSABS 0.1 10/19/2019 1718   BASOSABS 0.0 10/19/2019 1718    Assessment/Plan: 1. Preeclampsia- nephrotic range proteinuria and HTN.  Currently on procardia xl 60 mg with improvement.  Possible atypical HELLP per MFM.  1. On procardia XL - if worsens can increase procardia back to 90 mg daily.  Currently 134/80 on my exam. 2. Asa 81 mg daily 3. Cr 1.77 4. Note thrombocytopenia  improving plt 157 2. HTN:  On procardia 60 mg daily.  S/p lasix as well with blood products.  Discussed importance of low salt diet. 3. Cardiomegaly - with pericardial effusion without tamponade.  Cardiology following and they are repeating echo  4. AKI/CKD stage 3- slight bump in Scr likely due to combination of preeclampsia as well as significant anemia; had hypotension associated with epidural   1. Scr 1.77 2. Continue to follow UOP and Scr 5. Type 1 DM- poorly controlled prior to pregnancy with improvement 6. CKD stage 3 - due to diabetic nephropathy.  Stressed importance of diabetic control with goal Hgb A1c <7%, use of an ACE or ARB, however unable to use during pregnancy.  Also discussed the need to avoid NSAIDs/Cox-II I"s. 1. She has already established care at our office with Dr. Carolin Sicks.  7. Anemia- s/p blood transfusion.  Hb 8.1 (nadir 6.6 overnight) 1. Acute blood loss s/p transfusion after delivery   2. S/p IV feraheme 510 mg x 1 dose on 2/21  3. May require ESA although it will take weeks to take effect  4. Transfuse prn   Claudia Desanctis, MD  10/23/2019 3:56 PM

## 2019-10-23 NOTE — Op Note (Signed)
Kathy Lewis, Kathy Lewis MEDICAL RECORD [id number] ACCOUNT [id number] DATE OF BIRTH:[birth year] FACILITY: [facility] LOCATION: [facility] PHYSICIAN:Malee Grays Al Decant, MD  OPERATIVE REPORT  DATE OF PROCEDURE:  10/22/2019  PREOPERATIVE DIAGNOSES:   1.  Postpartum hemorrhage, status post spontaneous vaginal delivery at 30 weeks and 5 days. 2.  Preeclampsia with severe features.  POSTOPERATIVE DIAGNOSES:   1.  Postpartum hemorrhage, status post spontaneous vaginal delivery at 30 weeks and 5 days. 2.  Preeclampsia with severe features.  PROCEDURES:   1.  Ultrasound-guided dilation and evacuation.   2.  Bakri balloon placement attempt.  SURGEON:  [name], MD  ASSISTANT:  Technician.  ANESTHESIA:  Epidural.  IV FLUIDS:  250 mL.  URINE OUTPUT:  50 mL.  ESTIMATED BLOOD LOSS:  50 mL.  BLOOD ADMINISTERED:  None.  DRAINS:  Foley catheter.  SPECIMEN:  Endometrial curettings.  DISPOSITION OF SPECIMEN:  To pathology.  PATIENT DISPOSITION:  To PACU, hemodynamically stable.  FINDINGS:  Clot noted in the cervix to the lower uterine segment.  Endometrial stripe did not have any signs of retained products, but just blood.  Cervix did not have any lacerations.  Vagina and labia were edematous.  No active clot in the vaginal  vault, no active bleeding.  INDICATIONS:  The patient is 24 years old who delivered vaginally at 10 and 5/7th weeks, after which time the placenta delivered spontaneously and she had uterine atony that improved with fundal massage.  She was given Cytotec 1000 per rectum, Hemabate  and TXA.  I attempted to put in a Bakri balloon and was unsuccessful.  There was a clot at the cervix which was removed and the bleeding seemed to slow.  As the staff cleaned up the patient, they noticed that she continued to trickle and would  intermittently have a gush of bleeding.  At the time, the decision to do the curettage was made.  She had lost 1300 mL.  Her vital signs were stable, but  she clinically looked unwell.  She was having nausea, she was pale, was somewhat responsive, but  appeared to be losing alertness.  Code hemorrhage was not called because her vital signs were not in stable, but due to her clinical picture and the unclear reason for the continuing blood loss, a dilatation and curettage was recommended.  Risks and  benefits were reviewed.  The patient consented.  DESCRIPTION OF PROCEDURE:  The patient was taken to the operating room.  She was placed in the dorsal lithotomy position.  Epidural anesthesia was optimized.  Once that was confirmed, she was prepped and draped in a normal sterile fashion.  A timeout was  performed.  She received Unasyn 1 dose during the procedure.  SCDs were on her legs and operating.  She still had a Foley catheter in place.  A weighted speculum was inserted into the vagina and a right angle retractor was then used to visualize the cervix, which was inadequate.  I then used a Graves speculum.  The labia were quite edematous.  She did not have any lacerations after delivery,  but at the end of the procedure due to the instrumentation of the vaginal vault, she did have lacerations that needed to be stitched.  The ultrasonographer noted that she thought there was a fibroid in the lower uterine segment of the cervix.  I then put in the curette where that area was.  A clot came out.  It was approximately the size of maybe a grape.  I then cleared  off the uterine  fundus with the curette.  There were some small blood clots, but nothing excessive.  After manipulation, the bleeding increased.  I attempted to place the Bakri balloon, which I got to the upper fundus.  Once it was inflated, the balloon was then  expelled.  I attempted to deflate and reinsert and it continued to come out.  On the ultrasound and by exam at that point, the patient seemed to be very firm, so that procedure was aborted.  The lacerations were repaired with a 2-0 Vicryl in a   figure-of-eight on the right and the left just at the labia majora because of the small tears that were actively bleeding.  I used a ring forceps with a sponge to hold pressure on the cervix and the vagina lacerations and the bleeding slowed.  I then  place a moistened Kerlix roll for vaginal packing primarily to apply pressure to the vaginal wall and the labia where the lacerations were.  Only about one-fourth of the roll was used and that was cut with a tag outside of the introitus.  All instrument, sponge and needle counts were correct x3.  The patient tolerated the procedure well.  She actually looked more alert and responsive and appropriate at the end of the procedure and stated she was feeling better.  The patient was taken to the recovery room in stable condition.  VN/NUANCE  D:10/23/2019 T:10/23/2019 JOB:[id number]

## 2019-10-23 NOTE — Progress Notes (Signed)
OB postpartum note  Pt reports feeling better today- blurry vision continues to improve, still reporting scotomata.  No headache.  Denies CP/SOB.  Denies nausea/vomiting.  Tolerating gen deit.  No RUQ pain.  Some ambulation without difficulty.  Overall resting comfortably in bed  O: BP (!) 157/92   Pulse 97   Temp 98.4 F (36.9 C) (Oral)   Resp 16   Ht 5' (1.524 m)   Wt 86.2 kg   LMP 03/14/2019 (Exact Date)   SpO2 98%   Breastfeeding Unknown   BMI 37.13 kg/m  BP range: 101-157/61-98  UOP: 950cc this am (last 3hrs) Overnight: UOP: 2850cc/8hr  Gen:  NAD, A&O x 3 CV: systolic murmur, regular rate Lungs: CTAB, crackles lower bases improved Abd:  Fundus firm and Nontender, below umbilicus, +BS Ext: 2+ pedal edema bilaterally, SCDs in place  CBG (last 3)  Recent Labs    10/23/19 0012 10/23/19 0355 10/23/19 0531  GLUCAP 105* 80 92    Intake/Output Summary (Last 24 hours) at 10/23/2019 0737 Last data filed at 10/23/2019 0728 Gross per 24 hour  Intake 3710.07 ml  Output 5964 ml  Net -2253.93 ml   CBC Latest Ref Rng & Units 10/23/2019 10/22/2019 10/22/2019  WBC 4.0 - 10.5 K/uL 13.0(H) 12.5(H) 13.9(H)  Hemoglobin 12.0 - 15.0 g/dL 8.1(L) 6.6(LL) 7.2(L)  Hematocrit 36.0 - 46.0 % 23.6(L) 18.8(L) 21.2(L)  Platelets 150 - 400 K/uL 157 133(L) 133(L)    CMP Latest Ref Rng & Units 10/22/2019 10/22/2019 10/22/2019  Glucose 70 - 99 mg/dL 111(H) 103(H) 165(H)  BUN 6 - 20 mg/dL 15 14 16   Creatinine 0.44 - 1.00 mg/dL 1.71(H) 1.90(H) 1.75(H)  Sodium 135 - 145 mmol/L 136 135 134(L)  Potassium 3.5 - 5.1 mmol/L 4.1 3.9 3.9  Chloride 98 - 111 mmol/L 107 104 105  CO2 22 - 32 mmol/L 22 22 21(L)  Calcium 8.9 - 10.3 mg/dL 6.9(L) 7.3(L) 7.3(L)  Total Protein 6.5 - 8.1 g/dL 4.0(L) 4.8(L) 4.9(L)  Total Bilirubin 0.3 - 1.2 mg/dL 0.5 0.5 0.7  Alkaline Phos 38 - 126 U/L 58 72 62  AST 15 - 41 U/L 21 23 20   ALT 0 - 44 U/L 13 12 13     A/P 23yo [id number] s/p NSVD complicated by:  -Preeclampsia with  severe features  Currently on Magnesium- plan was to consider D/C after 12hrs; however due to elevation of BPs this am will continue for now.  If improved BP will discontinue later today  Currently on Procardia XL 60mg , may increase to 90mg  if BP continue to rise today, no IV medication needed  MFM following  -Type I DM-well controlled with Levimir and SSI.  Current accucheck as above, within normal range  Diabetic coordinator following  -AKI/Stage 3 kidney disease  Continue strict I/Os, foley still in place- plan to remove later today once D/C Magnesium  CMP pending this am  S/p Lasix x2- last dose @ 2248 after transfusion (2/25), will consider q 4-6hr if needed pending diuresis  Nephrology following  -Heme- anemia, PPH  EBL: ~ 1300cc- s/p TXA, cytotec, Hemabate, D&C  1upRBC given overnight (total 4upRBC and 1u FFP)  CBC stable as above, coags wnl  VSS, lochia appropriate, pt asymptomatic this am  -Cardio  Cardiomegaly noted on CXR, ECHO completed: Normal EF but moderate pericardial effusion, may be due to   Cardiology following- recommendations for repeat ECHO in 48hrs (2/26)  -Psych  Continue Lexapro daily  -Postpartum care  Tylenol and oxy for pain management,  plan to avoid NSAIDs  Tolerating general diet  Continue flagyl due to [place] infection  Janyth Pupa, DO [phone] (cell) [phone] (office)

## 2019-10-23 NOTE — Anesthesia Preprocedure Evaluation (Addendum)
Anesthesia Evaluation  Patient identified by MRN, date of birth, ID band Patient awake    Reviewed: Allergy & Precautions, NPO status , Patient's Chart, lab work & pertinent test results  Airway Mallampati: II       Dental no notable dental hx. (+) Teeth Intact   Pulmonary former smoker,     + decreased breath sounds      Cardiovascular hypertension, Pt. on medications Normal cardiovascular exam Rhythm:Regular Rate:Normal     Neuro/Psych PSYCHIATRIC DISORDERS Anxiety Depression    GI/Hepatic negative GI ROS, Neg liver ROS,   Endo/Other  diabetes, Type 1, Insulin Dependent  Renal/GU Renal InsufficiencyRenal diseaseCKD III     Musculoskeletal   Abdominal (+) + obese,   Peds  Hematology  (+) Blood dyscrasia, anemia ,   Anesthesia Other Findings   Reproductive/Obstetrics Immediate postpartum with significant PPH                             Anesthesia Physical  Anesthesia Plan  ASA: III and emergent  Anesthesia Plan: Epidural   Post-op Pain Management:    Induction:   PONV Risk Score and Plan:   Airway Management Planned: Simple Face Mask, Natural Airway and Nasal Cannula  Additional Equipment: None  Intra-op Plan:   Post-operative Plan:   Informed Consent: I have reviewed the patients History and Physical, chart, labs and discussed the procedure including the risks, benefits and alternatives for the proposed anesthesia with the patient or authorized representative who has indicated his/her understanding and acceptance.       Plan Discussed with:   Anesthesia Plan Comments: (To OR emergently for D&E for postpartum hemorrhage)        Anesthesia Quick Evaluation

## 2019-10-24 ENCOUNTER — Inpatient Hospital Stay (HOSPITAL_COMMUNITY): Payer: Managed Care, Other (non HMO)

## 2019-10-24 ENCOUNTER — Telehealth: Payer: Self-pay | Admitting: Cardiology

## 2019-10-24 ENCOUNTER — Other Ambulatory Visit: Payer: Self-pay | Admitting: Medical

## 2019-10-24 DIAGNOSIS — I3139 Other pericardial effusion (noninflammatory): Secondary | ICD-10-CM

## 2019-10-24 DIAGNOSIS — I313 Pericardial effusion (noninflammatory): Secondary | ICD-10-CM

## 2019-10-24 LAB — COMPREHENSIVE METABOLIC PANEL
ALT: 13 U/L (ref 0–44)
AST: 22 U/L (ref 15–41)
Albumin: 1.4 g/dL — ABNORMAL LOW (ref 3.5–5.0)
Alkaline Phosphatase: 60 U/L (ref 38–126)
Anion gap: 7 (ref 5–15)
BUN: 15 mg/dL (ref 6–20)
CO2: 23 mmol/L (ref 22–32)
Calcium: 7 mg/dL — ABNORMAL LOW (ref 8.9–10.3)
Chloride: 109 mmol/L (ref 98–111)
Creatinine, Ser: 1.59 mg/dL — ABNORMAL HIGH (ref 0.44–1.00)
GFR calc Af Amer: 52 mL/min — ABNORMAL LOW (ref >60)
GFR calc non Af Amer: 45 mL/min — ABNORMAL LOW (ref >60)
Glucose, Bld: 116 mg/dL — ABNORMAL HIGH (ref 70–99)
Potassium: 4.1 mmol/L (ref 3.5–5.1)
Sodium: 139 mmol/L (ref 135–145)
Total Bilirubin: 0.1 mg/dL — ABNORMAL LOW (ref 0.3–1.2)
Total Protein: 4.2 g/dL — ABNORMAL LOW (ref 6.5–8.1)

## 2019-10-24 LAB — CBC
HCT: 18.8 % — ABNORMAL LOW (ref 36.0–46.0)
HCT: 21.7 % — ABNORMAL LOW (ref 36.0–46.0)
Hemoglobin: 6.6 g/dL — CL (ref 12.0–15.0)
Hemoglobin: 7.5 g/dL — ABNORMAL LOW (ref 12.0–15.0)
MCH: 31.6 pg (ref 26.0–34.0)
MCH: 30.7 pg (ref 26.0–34.0)
MCHC: 35.1 g/dL (ref 30.0–36.0)
MCHC: 34.6 g/dL (ref 30.0–36.0)
MCV: 90 fL (ref 80.0–100.0)
MCV: 88.9 fL (ref 80.0–100.0)
Platelets: 133 10*3/uL — ABNORMAL LOW (ref 150–400)
Platelets: 158 10*3/uL (ref 150–400)
RBC: 2.09 MIL/uL — ABNORMAL LOW (ref 3.87–5.11)
RBC: 2.44 MIL/uL — ABNORMAL LOW (ref 3.87–5.11)
RDW: 14 % (ref 11.5–15.5)
RDW: 15.4 % (ref 11.5–15.5)
WBC: 12.5 10*3/uL — ABNORMAL HIGH (ref 4.0–10.5)
WBC: 12.6 10*3/uL — ABNORMAL HIGH (ref 4.0–10.5)
nRBC: 0 % (ref 0.0–0.2)
nRBC: 0 % (ref 0.0–0.2)

## 2019-10-24 LAB — GLUCOSE, CAPILLARY
Glucose-Capillary: 124 mg/dL — ABNORMAL HIGH (ref 70–99)
Glucose-Capillary: 79 mg/dL (ref 70–99)
Glucose-Capillary: 105 mg/dL — ABNORMAL HIGH (ref 70–99)
Glucose-Capillary: 52 mg/dL — ABNORMAL LOW (ref 70–99)
Glucose-Capillary: 78 mg/dL (ref 70–99)

## 2019-10-24 LAB — SURGICAL PATHOLOGY

## 2019-10-24 LAB — ECHOCARDIOGRAM LIMITED
Height: 60 in
Weight: 3042 oz

## 2019-10-24 LAB — HIGH SENSITIVITY CRP: CRP, High Sensitivity: 65.92 mg/L — ABNORMAL HIGH (ref 0.00–3.00)

## 2019-10-24 LAB — MAGNESIUM: Magnesium: 3.9 mg/dL — ABNORMAL HIGH (ref 1.7–2.4)

## 2019-10-24 MED ORDER — FUROSEMIDE 10 MG/ML IJ SOLN
40.0000 mg | Freq: Once | INTRAMUSCULAR | Status: AC
  Administered 2019-10-24: 40 mg via INTRAVENOUS
  Filled 2019-10-24: qty 4

## 2019-10-24 MED ORDER — FUROSEMIDE 10 MG/ML IJ SOLN
20.0000 mg | Freq: Two times a day (BID) | INTRAMUSCULAR | Status: DC
  Administered 2019-10-24: 20 mg via INTRAVENOUS
  Filled 2019-10-24: qty 2

## 2019-10-24 MED ORDER — METRONIDAZOLE 500 MG PO TABS
500.0000 mg | ORAL_TABLET | Freq: Two times a day (BID) | ORAL | Status: AC
  Administered 2019-10-24 – 2019-10-25 (×2): 500 mg via ORAL
  Filled 2019-10-24 (×2): qty 1

## 2019-10-24 MED ORDER — POTASSIUM CHLORIDE CRYS ER 20 MEQ PO TBCR: 10.0000 meq | EXTENDED_RELEASE_TABLET | Freq: Every day | ORAL | Status: DC

## 2019-10-24 NOTE — Progress Notes (Addendum)
HD #10 S/p SVD #2  Pt continues to feel better each day.  Reports minimal bleeding.  Denies headaches, upper abdominal pain.  Scotomata are still present, blurry vision this morning but she thinks that is because she just woke up.  Denies SOB or chest pain.  +ambulating but feels more swollen afterwards.  +Breast feeding.  Pumping is going well.  RN informed her of decreased milk supply with diuretics but she understands the need for the diuretics.  Pt reports she has had back spasms frequently due to her previous back injuries.  They happen frequently.  This recent episode was short.  She has never had PT only been to the chiropractor.    Pt reports when she was in the OR she heard a nurse say she was lucky to be alive.  She is inquiring how bad was the hemorrhage.  She reports she has prayed frequently throughout the pregnancy and is very thankful her daughter is ok.  She is tearful when thinking about the hemorrhage b/c she thought she was going to die.  Temp:  [97.5 F (36.4 C)-99.1 F (37.3 C)] 98.5 F (36.9 C) (02/26 0746) Pulse Rate:  [93-99] 94 (02/26 0746) Resp:  [17-18] 17 (02/26 0746) BP: (134-156)/(74-89) 146/89 (02/26 0746) SpO2:  [95 %-99 %] 98 % (02/26 0746)    Intake/Output Summary (Last 24 hours) at 10/24/2019 1023 Last data filed at 10/24/2019 0940 Gross per 24 hour  Intake 1840.54 ml  Output 2325 ml  Net -484.46 ml    Gen:  NAD, A&O x 3, speaks in complete sentences, Tearful CV:  Echo being performed.  Pericardial fluid noted.  Regular rhythm visualized. Lungs: Breathing normally, unlabored. Abd:  Fundus firm and Nontender, below umbilicus Ext: 2+ pedal edema bilaterally, edema is below the knee-no longer up to the hip MS:  Pt had a back spasm while we were talking.  It started abruptly at her neck.  Last ~15 secs.  Seemed to cause severe pain. CBG (last 3)  Recent Labs    10/23/19 1442 10/23/19 2045 10/24/19 0641  GLUCAP 99 95 124*   CBC Latest Ref Rng &  Units 10/24/2019 10/23/2019 10/22/2019  WBC 4.0 - 10.5 K/uL 12.6(H) 13.0(H) 12.5(H)  Hemoglobin 12.0 - 15.0 g/dL 7.5(L) 8.1(L) 6.6(LL)  Hematocrit 36.0 - 46.0 % 21.7(L) 23.6(L) 18.8(L)  Platelets 150 - 400 K/uL 158 157 133(L)    CMP Latest Ref Rng & Units 10/24/2019 10/23/2019 10/22/2019  Glucose 70 - 99 mg/dL 116(H) 98 111(H)  BUN 6 - 20 mg/dL 15 14 15   Creatinine 0.44 - 1.00 mg/dL 1.59(H) 1.77(H) 1.71(H)  Sodium 135 - 145 mmol/L 139 135 136  Potassium 3.5 - 5.1 mmol/L 4.1 4.2 4.1  Chloride 98 - 111 mmol/L 109 106 107  CO2 22 - 32 mmol/L 23 22 22   Calcium 8.9 - 10.3 mg/dL 7.0(L) 6.9(L) 6.9(L)  Total Protein 6.5 - 8.1 g/dL 4.2(L) 3.7(L) 4.0(L)  Total Bilirubin 0.3 - 1.2 mg/dL 0.1(L) 0.4 0.5  Alkaline Phos 38 - 126 U/L 60 61 58  AST 15 - 41 U/L 22 22 21   ALT 0 - 44 U/L 13 14 13     A/P 23yo [id number] s/p NSVD Day #2 complicated by:  -Preeclampsia with severe features-S/p Magnesium sulfate for seizure prophylaxis.  Increase Procardia XL 90 mg once daily to keep BP around goal of 130/80  Lasix 20 mg q 12 hours while inpatient.  Defer to Nephrology or cardiology on how long to continue  it once discharged.  -Type I DM-well controlled with Levimir and Novolog AC meals.  Current accucheck as above, within normal range  Diabetic coordinator following.  Call Dr. Chalmers Cater, endocrinology, to arrange outpatient follow up.  -AKI/Stage 3 kidney disease  Continue strict I/Os, foley still in place- plan to remove later today once D/C Magnesium  Creatnine improving.  Monitor potassium with daily Lasix.  Potassium currently normal.  -Heme- anemia, PPH  EBL: ~ 1300cc- s/p TXA, cytotec, Hemabate, D&C  1upRBC given (total 4upRBC and 1u FFP)  CBC stable as above, coags wnl  VSS, lochia appropriate, pt asymptomatic this am  -Cardio  I still visualize the pericardial effusion at bedside.  Has been less than 48 hours since previous echo.  Await cardiology recommendations.  Recurrent Back  spasms  Consult PT for evaluation due to prolonged bed rest and h/o back injury.    -Psych  Continue Lexapro 15 mg daily.  Pt is very thankful that she has come through the delivery.    Consult spiritual services for trauma experienced around delivery.  -Postpartum care  Tylenol and oxy for pain management, plan to avoid NSAIDs  Tolerating general diet  Last day of    Flagyl due to BV infection  E. Cyndia Skeeters, MD [facility]  [phone]- cell [phone]- office

## 2019-10-24 NOTE — Progress Notes (Signed)
[employer] called the office of [facility] with the following message.  Butch Penny with [employer] is pts high risk maternity case manager. Since pt is currently hospitalized she just wanted to give [place] her contact info in case we need assistance with anything once pt is discharged or post partum. [phone].  Meridee Score, MD

## 2019-10-24 NOTE — Progress Notes (Addendum)
I spoke with Dr. Royce Macadamia regarding which home meds she may need.  She feels the pt will need to remain in house to diuresis with IV Lasix, given she still has the pericardial effusion. She will increase Lasix dosing. Once euvolemic, her Procardia XL dosage may need to be decreased.  I anticipate pt will remain inpatient through the weekend.  Will order CMP, CBC for am.  Also, Dr. Suzette Battiest, pt's endocrinologist, will reach out to her next week to schedule f/u appt.  She feels pt will be an excellent candidate for an insulin pump that acts like an artifical pancreas.

## 2019-10-24 NOTE — Progress Notes (Signed)
PT Cancellation Note  Patient Details Name: Kathy Lewis MRN: [id number] DOB: 02-01-1996   Cancelled Treatment:    Reason Eval/Treat Not Completed: PT screened, no acute PT needs identified, will sign off. Pt out of room. Per chart pt has had a history of back spasms from prior back problems. Per nursing pt is currently mobilizing well and not c/o any back pain. Recommend pt follow up with primary care physician for possible outpatient PT. Outpatient PT better venue to address chronic back pain.    Shary Decamp [facility] 10/24/2019, 3:27 PM [place] Pager [phone] Office [phone]

## 2019-10-24 NOTE — Clinical Social Work Maternal (Signed)
CLINICAL SOCIAL WORK MATERNAL/CHILD NOTE  Patient Details  Name: Kathy Lewis MRN: [id number] Date of Birth: 06/03/1996  Date:  10/24/2019  Clinical Social Worker Initiating Note:  Manuelita Moxon, LCSW Date/Time: Initiated:  10/24/19/1133     Child's Name:  Kathy Lewis   Biological Parents:  Mother, Father(Father: Ryan Lisenby)   Need for Interpreter:  None   Reason for Referral:  Behavioral Health Concerns, Parental Support of Premature Babies < 32 weeks/or Critically Ill babies   Address:  [address] [place] [address]    Phone number:  [phone] (home)     Additional phone number:   Household Members/Support Persons (HM/SP):       HM/SP Name Relationship DOB or Age  HM/SP -1        HM/SP -2        HM/SP -3        HM/SP -4        HM/SP -5        HM/SP -6        HM/SP -7        HM/SP -8          Natural Supports (not living in the home):  Parent, Spouse/significant other   Professional Supports: None   Employment: Full-time   Type of Work: Customer Service Representative   Education:  Some College   Homebound arranged: No  Financial Resources:  Medicaid, Private Insurance   Other Resources:  Food Stamps (Plans to apply for WIC)   Cultural/Religious Considerations Which May Impact Care:    Strengths:  Ability to meet basic needs , Understanding of illness, Pediatrician chosen   Psychotropic Medications:         Pediatrician:    [place] area  Pediatrician List:   [facility] (Peds)  [place]    [place]    [place]    [place]    [place]      Pediatrician Fax Number:    Risk Factors/Current Problems:  None   Cognitive State:  Able to Concentrate , Alert , Goal Oriented , Insightful , Linear Thinking    Mood/Affect:  Calm , Interested , Relaxed    CSW Assessment: CSW met with MOB at bedside to discuss behavioral health  concerns and infant's NICU admission, FOB present. CSW introduced self and explained reason for consult "NICU admission". MOB was welcoming, pleasant, open and engaged during assessment. FOB's participation is assessment was minimal. 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 MOB reported that she receives food stamps and plans to apply for WIC. MOB reported that she has started to shop for infant and feels that she will be able to get everything infant needs. CSW informed MOB about [employer] if they need any assistance obtaining items for infant. CSW inquired about MOB's support system, MOB reported that FOB and her parents are her supports.   CSW provided review of Sudden Infant Death Syndrome (SIDS) precautions. Parents reported that were not familiar and that infant has a basinet to sleep in.   CSW and parents discussed infant's NICU admission. CSW asked MOB if they expected to have infant early, MOB reported that she felt her body telling her that she wasn't going to be able to continue to tolerate the pregnancy. CSW acknowledged MOB's experience. CSW informed parents about the NICU, what to expect and   resources/supports available while infant is admitted to the NICU. Parents reported that they feel well informed and denied any questions/concerns. MOB denied any transportation barriers. MOB reported that meal vouchers would be helpful, CSW agreed to leave meal vouchers at infant's bedside.   CSW asked FOB to leave the room to speak with MOB privately, FOB left the room. CSW inquired about MOB's mental health history. MOB reported that she was diagnosed with anxiety and depression in 2017 and currently taking Lexapro. MOB reported that her medication is helpful and managed by her OB provider. MOB denied any current symptoms but endorsed symptoms during her pregnancy. MOB described her anxiety as feeling  overwhelmed, racing thoughts and increased heart rate. MOB described her depression as sleeping a lot, isolating, crying and feeling sad. CSW acknowledged MOB's symptoms and inquired about her coping skills. MOB reported that talking to her mom makes her feel better and that her mom is a good support. MOB reported other coping skills as being distracted, finding a good show to watch and that keeping a routine keeps her regulated. MOB reported that she experienced a panic attack at the hospital before having infant, CSW acknowledged and validated MOB's experience. CSW inquired about how MOB was feeling emotionally after giving birth, MOB reported that she has been very emotional crying happy tears and thankful. CSW asked MOB if she was interested in therapy resources, MOB reported yes but she is not aware if she can afford therapy. CSW spoke to MOB about her options as MOB confirmed having private insurance and medicaid. MOB receptive and thankful for resources provided. MOB presented calm and pleasant during assessment. MOB did not demonstrate any acute mental health signs/symptoms. CSW assessed for safety, MOB denied SI, HI and domestic violence.   CSW provided education regarding the baby blues period vs. perinatal mood disorders, discussed treatment and gave resources for mental health follow up if concerns arise.  CSW recommends self-evaluation during the postpartum time period using the New Mom Checklist from Postpartum Progress and encouraged MOB to contact a medical professional if symptoms are noted at any time.    CSW will continue to offer resources/supports while infant is admitted to the NICU.     CSW Plan/Description:  Sudden Infant Death Syndrome (SIDS) Education, Perinatal Mood and Anxiety Disorder (PMADs) Education, Other Patient/Family Education, Other Information/Referral to Community Resources    Eliska Hamil L Makynli Stills, LCSW 10/24/2019, 11:36 AM 

## 2019-10-24 NOTE — Lactation Note (Signed)
This note was copied from a baby's chart. Lactation Consultation Note  Patient Name: Kathy Lewis [id number] Date: 10/24/2019  [name] is 45 hours in the NICU.  Mom is pumping and recently pumped 10 mls from each breast.  Breasts are feeling fuller.  Mom plans on bringing milk to NICU.  Encouraged to call with concerns.   Maternal Data    Feeding Feeding Type: Donor Breast Milk  LATCH Score                   Interventions    Lactation Tools Discussed/Used     Consult Status      Ave Filter 10/24/2019, 9:07 AM

## 2019-10-24 NOTE — Progress Notes (Signed)
Echocardiogram 2D Echocardiogram has been performed.  Oneal Deputy Kathy Lewis 10/24/2019, 8:44 AM

## 2019-10-24 NOTE — Progress Notes (Addendum)
Nephrology Progress Note:   Patient ID: Kathy Lewis, female   DOB: 08-05-1996, 24 y.o.   MRN: [id number]  S:  Repeat echo with persistent pericardial effusion without tamponade.  She had 3.5 liters UOP over 2/25 and has 1.2 liters charted thus far on 2/26.  Got lasix 20 mg IV this am.  She got to see family just now.     Review of systems  Denies n/v Denies shortness of breath or chest pain  Denies dizziness  Vision has been blurry today   O:BP 120/74 (BP Location: Right Arm)   Pulse 93   Temp 98.1 F (36.7 C) (Oral)   Resp 17   Ht 5' (1.524 m)   Wt 86.2 kg   LMP 03/14/2019 (Exact Date)   SpO2 98%   Breastfeeding Unknown   BMI 37.13 kg/m   Intake/Output Summary (Last 24 hours) at 10/24/2019 1459 Last data filed at 10/24/2019 1204 Gross per 24 hour  Intake 1556 ml  Output 2350 ml  Net -794 ml   Intake/Output: I/O last 3 completed shifts: In: 3917.5 [P.O.:2380; I.V.:1241.5; Blood:296] Out: [id number]; Blood:82]  Intake/Output this shift:  Total I/O In: 476 [P.O.:476] Out: 1150 [Urine:1150] Weight change:    Physical exam:     Gen: adult female NAD  CVS: S1S2 no rub Resp: cta unlabored   Abd: nontender postpartum  Ext: 2-3+ lower extremity edema bilaterally Psych normal mood and affect.  Neuro - alert and oriented x 3; follows commands and provides history  Recent Labs  Lab 10/21/19 0536 10/21/19 1721 10/22/19 0537 10/22/19 1207 10/22/19 2208 10/23/19 0610 10/24/19 0744  NA 137 134* 134* 135 136 135 139  K 3.9 4.0 3.9 3.9 4.1 4.2 4.1  CL 108 106 105 104 107 106 109  CO2 20* 21* 21* 22 22 22 23   GLUCOSE 99 160* 165* 103* 111* 98 116*  BUN 18 16 16 14 15 14 15   CREATININE 1.59* 1.68* 1.75* 1.90* 1.71* 1.77* 1.59*  ALBUMIN 1.6* 1.6* 1.6* 1.6* 1.4* 1.5* 1.4*  CALCIUM 7.8* 7.7* 7.3* 7.3* 6.9* 6.9* 7.0*  AST 18 18 20 23 21 22 22   ALT 13 11 13 12 13 14 13    Liver Function Tests: Recent Labs  Lab 10/22/19 2208 10/23/19 0610 10/24/19 0744  AST 21  22 22   ALT 13 14 13   ALKPHOS 58 61 60  BILITOT 0.5 0.4 0.1*  PROT 4.0* 3.7* 4.2*  ALBUMIN 1.4* 1.5* 1.4*   CBC: Recent Labs  Lab 10/19/19 1718 10/20/19 0622 10/22/19 1207 10/22/19 1207 10/22/19 1713 10/22/19 1713 10/22/19 2208 10/23/19 0610 10/24/19 0744  WBC 9.3   < > 10.6*   < > 13.9*   < > 12.5* 13.0* 12.6*  NEUTROABS 6.3  --   --   --   --   --   --   --   --   HGB 8.2*   < > 9.3*   < > 7.2*   < > 6.6* 8.1* 7.5*  HCT 24.1*   < > 27.8*   < > 21.2*   < > 18.8* 23.6* 21.7*  MCV 93.1   < > 92.4  --  93.0  --  90.0 89.7 88.9  PLT 127*   < > 151   < > 133*   < > 133* 157 158   < > = values in this interval not displayed.   Cardiac Enzymes: No results for input(s): CKTOTAL, CKMB, CKMBINDEX, TROPONINI in the last 168  hours. CBG: Recent Labs  Lab 10/23/19 1149 10/23/19 1442 10/23/19 2045 10/24/19 0641 10/24/19 1034  GLUCAP 129* 99 95 124* 79    Iron Studies:  No results for input(s): IRON, TIBC, TRANSFERRIN, FERRITIN in the last 72 hours. Studies/Results: ECHOCARDIOGRAM COMPLETE  Result Date: 10/22/2019    ECHOCARDIOGRAM REPORT   Patient Name:   Kathy Lewis Date of Exam: 10/22/2019 Medical Rec #:  [id number]     Height:       60.0 in Accession #:    [id number]    Weight:       190.1 lb Date of Birth:  1995-12-23     BSA:          1.827 m Patient Age:    23 years      BP:           150/98 mmHg Patient Gender: F             HR:           89 bpm. Exam Location:  Inpatient Procedure: 2D Echo, Cardiac Doppler and Color Doppler Indications:    I51.7 Cardiomegaly  History:        Patient has no prior history of Echocardiogram examinations.                 Risk Factors:Diabetes. CKD. Preeclampsia.  Sonographer:    Jonelle Sidle Dance Referring Phys: [place]  1. Left ventricular ejection fraction, by estimation, is 55 to 60%. The left ventricle has normal function. The left ventricle has no regional wall motion abnormalities. There is mild concentric left ventricular  hypertrophy. Indeterminate diastolic filling due to E-A fusion.  2. Right ventricular systolic function is normal. The right ventricular size is normal. Tricuspid regurgitation signal is inadequate for assessing PA pressure.  3. Moderate pericardial effusion. The pericardial effusion is circumferential. There is no evidence of cardiac tamponade.  4. The mitral valve is grossly normal. No evidence of mitral valve regurgitation.  5. The aortic valve is tricuspid. Aortic valve regurgitation is not visualized. No aortic stenosis is present.  6. The inferior vena cava is normal in size with greater than 50% respiratory variability, suggesting right atrial pressure of 3 mmHg. FINDINGS  Left Ventricle: Left ventricular ejection fraction, by estimation, is 55 to 60%. The left ventricle has normal function. The left ventricle has no regional wall motion abnormalities. The left ventricular internal cavity size was normal in size. There is  mild concentric left ventricular hypertrophy. Indeterminate diastolic filling due to E-A fusion. Right Ventricle: The right ventricular size is normal. No increase in right ventricular wall thickness. Right ventricular systolic function is normal. Tricuspid regurgitation signal is inadequate for assessing PA pressure. Left Atrium: Left atrial size was normal in size. Right Atrium: Right atrial size was normal in size. Pericardium: A moderately sized pericardial effusion is present. The pericardial effusion is circumferential. There is no evidence of cardiac tamponade. Mitral Valve: The mitral valve is grossly normal. No evidence of mitral valve regurgitation. Tricuspid Valve: The tricuspid valve is grossly normal. Tricuspid valve regurgitation is trivial. Aortic Valve: The aortic valve is tricuspid. Aortic valve regurgitation is not visualized. No aortic stenosis is present. Pulmonic Valve: The pulmonic valve was grossly normal. Pulmonic valve regurgitation is trivial. Aorta: The aortic  root and ascending aorta are structurally normal, with no evidence of dilitation. Venous: The inferior vena cava is normal in size with greater than 50% respiratory variability, suggesting right atrial pressure of 3 mmHg. IAS/Shunts:  No atrial level shunt detected by color flow Doppler.  LEFT VENTRICLE PLAX 2D LVIDd:         4.90 cm  Diastology LVIDs:         3.30 cm  LV e' lateral:   10.70 cm/s LV PW:         1.10 cm  LV E/e' lateral: 10.2 LV IVS:        1.00 cm  LV e' medial:    5.91 cm/s LVOT diam:     1.90 cm  LV E/e' medial:  18.4 LV SV:         66 LV SV Index:   36 LVOT Area:     2.84 cm  RIGHT VENTRICLE             IVC RV Basal diam:  2.20 cm     IVC diam: 1.50 cm RV S prime:     13.70 cm/s TAPSE (M-mode): 2.1 cm LEFT ATRIUM             Index       RIGHT ATRIUM           Index LA diam:        3.60 cm 1.97 cm/m  RA Area:     13.30 cm LA Vol (A2C):   72.5 ml 39.69 ml/m RA Volume:   31.00 ml  16.97 ml/m LA Vol (A4C):   48.3 ml 26.44 ml/m LA Biplane Vol: 59.2 ml 32.41 ml/m  AORTIC VALVE LVOT Vmax:   125.00 cm/s LVOT Vmean:  79.150 cm/s LVOT VTI:    0.234 m  AORTA Ao Root diam: 3.10 cm Ao Asc diam:  2.70 cm MITRAL VALVE MV Area (PHT): 2.58 cm     SHUNTS MV Decel Time: 294 msec     Systemic VTI:  0.23 m MV E velocity: 109.00 cm/s  Systemic Diam: 1.90 cm Eleonore Chiquito MD Electronically signed by Eleonore Chiquito MD Signature Date/Time: 10/22/2019/6:34:46 PM    Final    ECHOCARDIOGRAM LIMITED  Result Date: 10/24/2019    ECHOCARDIOGRAM LIMITED REPORT   Patient Name:   Kathy Lewis Date of Exam: 10/24/2019 Medical Rec #:  [id number]     Height:       60.0 in Accession #:    [id number]    Weight:       190.1 lb Date of Birth:  29-Mar-1996     BSA:          1.827 m Patient Age:    23 years      BP:           146/89 mmHg Patient Gender: F             HR:           97 bpm. Exam Location:  Inpatient Procedure: Limited Echo, Color Doppler and Cardiac Doppler Indications:    I31.3 Pericardial effusion (noninflammatory)   History:        Patient has prior history of Echocardiogram examinations, most                 recent 10/22/2019. Risk Factors:Diabetes and Pre-ecclampsia. 2                 days post-partum.  Sonographer:    Raquel Sarna Senior RDCS Referring Phys: [id number] [place]  1. Left ventricular ejection fraction, by estimation, is 60 to 65%. The left ventricle has normal function. The left ventricle has no regional wall motion abnormalities.  2. Right ventricular systolic function is normal. The right ventricular size is normal. There is normal pulmonary artery systolic pressure.  3. No change in size of effusion sicne 10/22/19 There appears to be variation in mitral inflow with respiration but IVC collapses and is not very dilated suggesting no tamponade . Moderate pericardial effusion. The pericardial effusion is posterior to the left ventricle and posterior to the left ventricle and the left atrium.  4. The mitral valve is normal in structure and function. Trivial mitral valve regurgitation. No evidence of mitral stenosis.  5. The aortic valve is normal in structure and function. Aortic valve regurgitation is not visualized. No aortic stenosis is present.  6. The inferior vena cava is normal in size with greater than 50% respiratory variability, suggesting right atrial pressure of 3 mmHg. FINDINGS  Left Ventricle: Left ventricular ejection fraction, by estimation, is 60 to 65%. The left ventricle has normal function. The left ventricle has no regional wall motion abnormalities. The left ventricular internal cavity size was normal in size. There is  no left ventricular hypertrophy. Right Ventricle: The right ventricular size is normal. No increase in right ventricular wall thickness. Right ventricular systolic function is normal. There is normal pulmonary artery systolic pressure. The tricuspid regurgitant velocity is 2.36 m/s, and  with an assumed right atrial pressure of 3 mmHg, the estimated right  ventricular systolic pressure is 99991111 mmHg. Left Atrium: Left atrial size was normal in size. Right Atrium: Right atrial size was normal in size. Pericardium: No change in size of effusion sicne 10/22/19 There appears to be variation in mitral inflow with respiration but IVC collapses and is not very dilated suggesting no tamponade. A moderately sized pericardial effusion is present. The pericardial effusion is posterior to the left ventricle and posterior to the left ventricle and the left atrium. Mitral Valve: The mitral valve is normal in structure and function. Normal mobility of the mitral valve leaflets. Trivial mitral valve regurgitation. No evidence of mitral valve stenosis. Tricuspid Valve: The tricuspid valve is normal in structure. Tricuspid valve regurgitation is not demonstrated. No evidence of tricuspid stenosis. Aortic Valve: The aortic valve is normal in structure and function. Aortic valve regurgitation is not visualized. No aortic stenosis is present. Pulmonic Valve: The pulmonic valve was normal in structure. Pulmonic valve regurgitation is not visualized. No evidence of pulmonic stenosis. Aorta: The aortic root is normal in size and structure. Venous: The inferior vena cava is normal in size with greater than 50% respiratory variability, suggesting right atrial pressure of 3 mmHg. IAS/Shunts: No atrial level shunt detected by color flow Doppler.  TRICUSPID VALVE TR Peak grad:   22.3 mmHg TR Vmax:        236.00 cm/s Jenkins Rouge MD Electronically signed by Jenkins Rouge MD Signature Date/Time: 10/24/2019/9:01:16 AM    Final    . sodium chloride   Intravenous Once  . sodium chloride   Intravenous Once  . escitalopram  15 mg Oral Daily  . furosemide  40 mg Intravenous Once  . insulin aspart  0-6 Units Subcutaneous TID WC  . insulin aspart  2 Units Subcutaneous Q breakfast  . insulin aspart  3 Units Subcutaneous Q lunch  . insulin aspart  3 Units Subcutaneous Q supper  . insulin detemir  12  Units Subcutaneous Daily  . metroNIDAZOLE  500 mg Oral Q12H  . NIFEdipine  90 mg Oral Daily  . prenatal multivitamin  1 tablet Oral Q1200  . senna-docusate  2 tablet  Oral Q24H  . Tdap  0.5 mL Intramuscular Once    BMET    Component Value Date/Time   NA 139 10/24/2019 0744   K 4.1 10/24/2019 0744   CL 109 10/24/2019 0744   CO2 23 10/24/2019 0744   GLUCOSE 116 (H) 10/24/2019 0744   BUN 15 10/24/2019 0744   CREATININE 1.59 (H) 10/24/2019 0744   CALCIUM 7.0 (L) 10/24/2019 0744   GFRNONAA 45 (L) 10/24/2019 0744   GFRAA 52 (L) 10/24/2019 0744   CBC    Component Value Date/Time   WBC 12.6 (H) 10/24/2019 0744   RBC 2.44 (L) 10/24/2019 0744   HGB 7.5 (L) 10/24/2019 0744   HCT 21.7 (L) 10/24/2019 0744   HCT 23.0 (L) 10/18/2019 0534   PLT 158 10/24/2019 0744   MCV 88.9 10/24/2019 0744   MCH 30.7 10/24/2019 0744   MCHC 34.6 10/24/2019 0744   RDW 15.4 10/24/2019 0744   LYMPHSABS 2.2 10/19/2019 1718   MONOABS 0.6 10/19/2019 1718   EOSABS 0.1 10/19/2019 1718   BASOSABS 0.0 10/19/2019 1718    Assessment/Plan: 1. Preeclampsia- nephrotic range proteinuria and HTN. Possible atypical HELLP per MFM.  1. On procardia 90 mg XL daily  2. Asa 81 mg daily 3. Cr 1.59 4. Note thrombocytopenia  improving plt 157 5. Lasix as below - 40 mg IV once this afternoon.   6. Would defer addition of ACE/ARB for now - would need very reliable form of birth control prior to initiation.  Can reassess as an outpatient likely  2. HTN:  On procardia 60 mg daily.  S/p lasix as well with blood products.  Discussed importance of low salt diet. 3. Cardiomegaly - with pericardial effusion without tamponade.  Continue lasix - dose at 40 mg IV this afternoon.  She would benefit from continued observation and IV diuretics given the effusion and setting of CKD and nephrotic range proteinuria.  Cardiology evaluating.  I will send ANA   4. AKI/CKD stage 3- slight bump in Scr likely due to combination of preeclampsia as  well as significant anemia; also had hypotension associated with epidural and lost blood with delivery 1. Cr 1.59 2. Continue to follow UOP and Scr 5. Type 1 DM- poorly controlled prior to pregnancy with improvement 6. CKD stage 3 - due to diabetic nephropathy.  Stressed importance of diabetic control with goal Hgb A1c <7%, use of an ACE or ARB, however unable to use during pregnancy.  Also discussed the need to avoid NSAIDs/Cox-II I"s. 1. She has established care at our office with Dr. Carolin Sicks.  7. Anemia- s/p blood transfusion. Hb 7.5  1. Would transfuse if further decline in Hb  2. Acute blood loss s/p transfusion after delivery   3. S/p IV feraheme 510 mg x 1 dose on 2/21  4. May require ESA although it will take weeks to take effect   Claudia Desanctis, MD  10/24/2019 3:35 PM

## 2019-10-24 NOTE — Progress Notes (Signed)
Inpatient Diabetes Program Recommendations  AACE/ADA: New Consensus Statement on Inpatient Glycemic Control (2015)  Target Ranges:  Prepandial:   less than 140 mg/dL      Peak postprandial:   less than 180 mg/dL (1-2 hours)      Critically ill patients:  140 - 180 mg/dL   Lab Results  Component Value Date   GLUCAP 79 10/24/2019   HGBA1C 12.1 (H) 12/18/2014    Spoke with Megan RN to ensure Levemir was administered, as patient is type 1 DM and requires basal insulin. In reviewing glucose trends, FSBG was 124 mg/dL and most current is 79 mg/dL (which is slightly under parameter for administration of Levemir); encouraged to go ahead and follow MD orders as patient usually skips breakfast and eats lunch and dinner. Per RN, patient continues to have variable intake at meals. Encouraged that basal insulin takes several hours to for action and to ensure patient consumes >50% of meals prior to administering scheduled meal coverage.    Thanks, Bronson Curb, MSN, RNC-OB Diabetes Coordinator [phone] (8a-5p)

## 2019-10-24 NOTE — Progress Notes (Signed)
Progress Note  Patient Name: Kathy Lewis Date of Encounter: 10/24/2019  Primary Cardiologist: No primary care provider on file.   Subjective   She denies chest pain, SOB, or palpitations. She is overall feeling well and relieved to hear the repeat echo was stable. She is pumping. Reports baby girl is doing good.   Inpatient Medications    Scheduled Meds: . sodium chloride   Intravenous Once  . sodium chloride   Intravenous Once  . escitalopram  15 mg Oral Daily  . furosemide  20 mg Intravenous Q12H  . insulin aspart  0-6 Units Subcutaneous TID WC  . insulin aspart  2 Units Subcutaneous Q breakfast  . insulin aspart  3 Units Subcutaneous Q lunch  . insulin aspart  3 Units Subcutaneous Q supper  . insulin detemir  12 Units Subcutaneous Daily  . NIFEdipine  90 mg Oral Daily  . prenatal multivitamin  1 tablet Oral Q1200  . senna-docusate  2 tablet Oral Q24H  . Tdap  0.5 mL Intramuscular Once   Continuous Infusions: . sodium chloride     PRN Meds: acetaminophen, benzocaine-Menthol, butalbital-acetaminophen-caffeine, coconut oil, witch hazel-glycerin **AND** dibucaine, diphenhydrAMINE, labetalol **AND** labetalol **AND** labetalol **AND** hydrALAZINE **AND** Measure blood pressure, misoprostol, ondansetron **OR** ondansetron (ZOFRAN) IV, oxyCODONE, oxyCODONE, simethicone, sodium chloride, zolpidem   Vital Signs    Vitals:   10/23/19 2116 10/24/19 0230 10/24/19 0639 10/24/19 0746  BP: (!) 144/87 (!) 141/86 139/87 (!) 146/89  Pulse: 95 97 96 94  Resp:  18 18 17   Temp: 98 F (36.7 C) 98.5 F (36.9 C) (!) 97.5 F (36.4 C) 98.5 F (36.9 C)  TempSrc:  Oral Oral Oral  SpO2:  98% 99% 98%  Weight:      Height:        Intake/Output Summary (Last 24 hours) at 10/24/2019 1052 Last data filed at 10/24/2019 0940 Gross per 24 hour  Intake 1840.54 ml  Output 2325 ml  Net -484.46 ml   Filed Weights   10/19/19 0500 10/20/19 0538 10/21/19 0545  Weight: 86.5 kg 86.2 kg 86.2 kg      Telemetry    N/A - Personally Reviewed  Physical Exam   GEN: No acute distress.   Neck: No JVD Cardiac: RRR, no murmurs Respiratory: Clear to auscultation bilaterally GI: Soft, nontender, non-distended  MS: 1+ edema Neuro:  Nonfocal, moving all extremities spontaneously Psych: Normal affect   Labs    Chemistry Recent Labs  Lab 10/22/19 2208 10/23/19 0610 10/24/19 0744  NA 136 135 139  K 4.1 4.2 4.1  CL 107 106 109  CO2 22 22 23   GLUCOSE 111* 98 116*  BUN 15 14 15   CREATININE 1.71* 1.77* 1.59*  CALCIUM 6.9* 6.9* 7.0*  PROT 4.0* 3.7* 4.2*  ALBUMIN 1.4* 1.5* 1.4*  AST 21 22 22   ALT 13 14 13   ALKPHOS 58 61 60  BILITOT 0.5 0.4 0.1*  GFRNONAA 41* 40* 45*  GFRAA 48* 46* 52*  ANIONGAP 7 7 7      Hematology Recent Labs  Lab 10/22/19 2208 10/23/19 0610 10/24/19 0744  WBC 12.5* 13.0* 12.6*  RBC 2.09* 2.63* 2.44*  HGB 6.6* 8.1* 7.5*  HCT 18.8* 23.6* 21.7*  MCV 90.0 89.7 88.9  MCH 31.6 30.8 30.7  MCHC 35.1 34.3 34.6  RDW 14.0 15.2 15.4  PLT 133* 157 158    Cardiac EnzymesNo results for input(s): TROPONINI in the last 168 hours. No results for input(s): TROPIPOC in the last 168 hours.  BNPNo results for input(s): BNP, PROBNP in the last 168 hours.   DDimer No results for input(s): DDIMER in the last 168 hours.   Radiology    [place] Intraoperative  Result Date: 10/22/2019 CLINICAL DATA:  Ultrasound was provided for use by the ordering physician, and a technical charge was applied by the performing facility.  No radiologist interpretation/professional services rendered.   ECHOCARDIOGRAM COMPLETE  Result Date: 10/22/2019    ECHOCARDIOGRAM REPORT   Patient Name:   Kathy Lewis Date of Exam: 10/22/2019 Medical Rec #:  [id number]     Height:       60.0 in Accession #:    [id number]    Weight:       190.1 lb Date of Birth:  [birth year]     BSA:          1.827 m Patient Age:    23 years      BP:           150/98 mmHg Patient Gender: F             HR:           89  bpm. Exam Location:  Inpatient Procedure: 2D Echo, Cardiac Doppler and Color Doppler Indications:    I51.7 Cardiomegaly  History:        Patient has no prior history of Echocardiogram examinations.                 Risk Factors:Diabetes. CKD. Preeclampsia.  Sonographer:    Jonelle Sidle Dance Referring Phys: [place]  1. Left ventricular ejection fraction, by estimation, is 55 to 60%. The left ventricle has normal function. The left ventricle has no regional wall motion abnormalities. There is mild concentric left ventricular hypertrophy. Indeterminate diastolic filling due to E-A fusion.  2. Right ventricular systolic function is normal. The right ventricular size is normal. Tricuspid regurgitation signal is inadequate for assessing PA pressure.  3. Moderate pericardial effusion. The pericardial effusion is circumferential. There is no evidence of cardiac tamponade.  4. The mitral valve is grossly normal. No evidence of mitral valve regurgitation.  5. The aortic valve is tricuspid. Aortic valve regurgitation is not visualized. No aortic stenosis is present.  6. The inferior vena cava is normal in size with greater than 50% respiratory variability, suggesting right atrial pressure of 3 mmHg. FINDINGS  Left Ventricle: Left ventricular ejection fraction, by estimation, is 55 to 60%. The left ventricle has normal function. The left ventricle has no regional wall motion abnormalities. The left ventricular internal cavity size was normal in size. There is  mild concentric left ventricular hypertrophy. Indeterminate diastolic filling due to E-A fusion. Right Ventricle: The right ventricular size is normal. No increase in right ventricular wall thickness. Right ventricular systolic function is normal. Tricuspid regurgitation signal is inadequate for assessing PA pressure. Left Atrium: Left atrial size was normal in size. Right Atrium: Right atrial size was normal in size. Pericardium: A moderately sized  pericardial effusion is present. The pericardial effusion is circumferential. There is no evidence of cardiac tamponade. Mitral Valve: The mitral valve is grossly normal. No evidence of mitral valve regurgitation. Tricuspid Valve: The tricuspid valve is grossly normal. Tricuspid valve regurgitation is trivial. Aortic Valve: The aortic valve is tricuspid. Aortic valve regurgitation is not visualized. No aortic stenosis is present. Pulmonic Valve: The pulmonic valve was grossly normal. Pulmonic valve regurgitation is trivial. Aorta: The aortic root and ascending aorta are structurally normal, with no evidence of dilitation.  Venous: The inferior vena cava is normal in size with greater than 50% respiratory variability, suggesting right atrial pressure of 3 mmHg. IAS/Shunts: No atrial level shunt detected by color flow Doppler.  LEFT VENTRICLE PLAX 2D LVIDd:         4.90 cm  Diastology LVIDs:         3.30 cm  LV e' lateral:   10.70 cm/s LV PW:         1.10 cm  LV E/e' lateral: 10.2 LV IVS:        1.00 cm  LV e' medial:    5.91 cm/s LVOT diam:     1.90 cm  LV E/e' medial:  18.4 LV SV:         66 LV SV Index:   36 LVOT Area:     2.84 cm  RIGHT VENTRICLE             IVC RV Basal diam:  2.20 cm     IVC diam: 1.50 cm RV S prime:     13.70 cm/s TAPSE (M-mode): 2.1 cm LEFT ATRIUM             Index       RIGHT ATRIUM           Index LA diam:        3.60 cm 1.97 cm/m  RA Area:     13.30 cm LA Vol (A2C):   72.5 ml 39.69 ml/m RA Volume:   31.00 ml  16.97 ml/m LA Vol (A4C):   48.3 ml 26.44 ml/m LA Biplane Vol: 59.2 ml 32.41 ml/m  AORTIC VALVE LVOT Vmax:   125.00 cm/s LVOT Vmean:  79.150 cm/s LVOT VTI:    0.234 m  AORTA Ao Root diam: 3.10 cm Ao Asc diam:  2.70 cm MITRAL VALVE MV Area (PHT): 2.58 cm     SHUNTS MV Decel Time: 294 msec     Systemic VTI:  0.23 m MV E velocity: 109.00 cm/s  Systemic Diam: 1.90 cm Eleonore Chiquito MD Electronically signed by Eleonore Chiquito MD Signature Date/Time: 10/22/2019/6:34:46 PM    Final     ECHOCARDIOGRAM LIMITED  Result Date: 10/24/2019    ECHOCARDIOGRAM LIMITED REPORT   Patient Name:   [facility] Djordjevic Date of Exam: 10/24/2019 Medical Rec #:  [id number]     Height:       60.0 in Accession #:    [id number]    Weight:       190.1 lb Date of Birth:  09-22-1995     BSA:          1.827 m Patient Age:    23 years      BP:           146/89 mmHg Patient Gender: F             HR:           97 bpm. Exam Location:  Inpatient Procedure: Limited Echo, Color Doppler and Cardiac Doppler Indications:    I31.3 Pericardial effusion (noninflammatory)  History:        Patient has prior history of Echocardiogram examinations, most                 recent 10/22/2019. Risk Factors:Diabetes and Pre-ecclampsia. 2                 days post-partum.  Sonographer:    Raquel Sarna Senior RDCS Referring Phys: [id number] [place]  1. Left ventricular  ejection fraction, by estimation, is 60 to 65%. The left ventricle has normal function. The left ventricle has no regional wall motion abnormalities.  2. Right ventricular systolic function is normal. The right ventricular size is normal. There is normal pulmonary artery systolic pressure.  3. No change in size of effusion sicne 10/22/19 There appears to be variation in mitral inflow with respiration but IVC collapses and is not very dilated suggesting no tamponade . Moderate pericardial effusion. The pericardial effusion is posterior to the left ventricle and posterior to the left ventricle and the left atrium.  4. The mitral valve is normal in structure and function. Trivial mitral valve regurgitation. No evidence of mitral stenosis.  5. The aortic valve is normal in structure and function. Aortic valve regurgitation is not visualized. No aortic stenosis is present.  6. The inferior vena cava is normal in size with greater than 50% respiratory variability, suggesting right atrial pressure of 3 mmHg. FINDINGS  Left Ventricle: Left ventricular ejection fraction, by  estimation, is 60 to 65%. The left ventricle has normal function. The left ventricle has no regional wall motion abnormalities. The left ventricular internal cavity size was normal in size. There is  no left ventricular hypertrophy. Right Ventricle: The right ventricular size is normal. No increase in right ventricular wall thickness. Right ventricular systolic function is normal. There is normal pulmonary artery systolic pressure. The tricuspid regurgitant velocity is 2.36 m/s, and  with an assumed right atrial pressure of 3 mmHg, the estimated right ventricular systolic pressure is [id number] mmHg. Left Atrium: Left atrial size was normal in size. Right Atrium: Right atrial size was normal in size. Pericardium: No change in size of effusion sicne 10/22/19 There appears to be variation in mitral inflow with respiration but IVC collapses and is not very dilated suggesting no tamponade. A moderately sized pericardial effusion is present. The pericardial effusion is posterior to the left ventricle and posterior to the left ventricle and the left atrium. Mitral Valve: The mitral valve is normal in structure and function. Normal mobility of the mitral valve leaflets. Trivial mitral valve regurgitation. No evidence of mitral valve stenosis. Tricuspid Valve: The tricuspid valve is normal in structure. Tricuspid valve regurgitation is not demonstrated. No evidence of tricuspid stenosis. Aortic Valve: The aortic valve is normal in structure and function. Aortic valve regurgitation is not visualized. No aortic stenosis is present. Pulmonic Valve: The pulmonic valve was normal in structure. Pulmonic valve regurgitation is not visualized. No evidence of pulmonic stenosis. Aorta: The aortic root is normal in size and structure. Venous: The inferior vena cava is normal in size with greater than 50% respiratory variability, suggesting right atrial pressure of 3 mmHg. IAS/Shunts: No atrial level shunt detected by color flow Doppler.   TRICUSPID VALVE TR Peak grad:   22.3 mmHg TR Vmax:        236.00 cm/s Jenkins Rouge MD Electronically signed by Jenkins Rouge MD Signature Date/Time: 10/24/2019/9:01:16 AM    Final     Cardiac Studies   Echocardiogram 10/22/19: 1. Left ventricular ejection fraction, by estimation, is 55 to 60%. The  left ventricle has normal function. The left ventricle has no regional  wall motion abnormalities. There is mild concentric left ventricular  hypertrophy. Indeterminate diastolic  filling due to E-A fusion.  2. Right ventricular systolic function is normal. The right ventricular  size is normal. Tricuspid regurgitation signal is inadequate for assessing  PA pressure.  3. Moderate pericardial effusion. The pericardial effusion is  circumferential.  There is no evidence of cardiac tamponade.  4. The mitral valve is grossly normal. No evidence of mitral valve  regurgitation.  5. The aortic valve is tricuspid. Aortic valve regurgitation is not  visualized. No aortic stenosis is present.  6. The inferior vena cava is normal in size with greater than 50%  respiratory variability, suggesting right atrial pressure of 3 mmHg.   Echocardiogram 10/24/19: 1. Left ventricular ejection fraction, by estimation, is 60 to 65%. The  left ventricle has normal function. The left ventricle has no regional  wall motion abnormalities.  2. Right ventricular systolic function is normal. The right ventricular  size is normal. There is normal pulmonary artery systolic pressure.  3. No change in size of effusion sicne 10/22/19 There appears to be  variation in mitral inflow with respiration but IVC collapses and is not  very dilated suggesting no tamponade . Moderate pericardial effusion. The  pericardial effusion is posterior to  the left ventricle and posterior to the left ventricle and the left  atrium.  4. The mitral valve is normal in structure and function. Trivial mitral  valve regurgitation. No evidence  of mitral stenosis.  5. The aortic valve is normal in structure and function. Aortic valve  regurgitation is not visualized. No aortic stenosis is present.  6. The inferior vena cava is normal in size with greater than 50%  respiratory variability, suggesting right atrial pressure of 3 mmHg.   Patient Profile     24 y.o. female (newly postpartum) with a hx of Type 1 DM diagnosed at age 19, CKD stage III with nephrotic range proteinuria (followed by nephrology), anxiety, HSV, admitted with pre-eclampsia s/p vaginal delivery of 29w old baby girl, who is being followed by cardiology for cardiomegaly.   Assessment & Plan    1. Pericardial effusion: moderate pericardial effusion 2/24, stable on repeat echo 2/26. No evidence of tamponade. Possibly 2/2 pre-eclampsia. TSH and inflammatory markers benign.  - Will plan for continued surveillance monitoring with a repeat limited echo in 2 weeks. - She can follow-up with Dr. Gardiner Rhyme after echo to determine if ongoing monitoring is needed.   2. Pre-eclampsia: BP overall stable   3. DM type 1  4. AoCKD stage 3  [facility] will sign off.   Medication Recommendations:  None Other recommendations (labs, testing, etc):  Will schedule outpatient echo in 2 weeks Follow up as an outpatient:  Will schedule f/u appointment   For questions or updates, please contact [place] Please consult [email or web address] for contact info under Cardiology/STEMI.      Signed, Abigail Butts, PA-C  10/24/2019, 10:52 AM   [phone]  Patient seen and examined.  Agree with above documentation.  On exam, patient is alert and oriented, regular rate and rhythm, no murmurs, lungs CTAB, no JVD, 1+ LE edema.  TTE today shows stable pericardial effusion.  We will plan repeat TTE in 2 weeks and arrange outpatient f/u.  Donato Heinz, MD

## 2019-10-24 NOTE — Telephone Encounter (Signed)
Patient has TOC appt with Dr. Gardiner Rhyme on March 15, 21, requested by Daleen Snook.

## 2019-10-25 LAB — CBC WITH DIFFERENTIAL/PLATELET
Abs Immature Granulocytes: 0.05 10*3/uL (ref 0.00–0.07)
Basophils Absolute: 0.1 10*3/uL (ref 0.0–0.1)
Basophils Relative: 0 %
Eosinophils Absolute: 0.2 10*3/uL (ref 0.0–0.5)
Eosinophils Relative: 1 %
HCT: 31.3 % — ABNORMAL LOW (ref 36.0–46.0)
Hemoglobin: 10.6 g/dL — ABNORMAL LOW (ref 12.0–15.0)
Immature Granulocytes: 0 %
Lymphocytes Relative: 25 %
Lymphs Abs: 3.9 10*3/uL (ref 0.7–4.0)
MCH: 30.5 pg (ref 26.0–34.0)
MCHC: 33.9 g/dL (ref 30.0–36.0)
MCV: 90.2 fL (ref 80.0–100.0)
Monocytes Absolute: 0.8 10*3/uL (ref 0.1–1.0)
Monocytes Relative: 5 %
Neutro Abs: 10.9 10*3/uL — ABNORMAL HIGH (ref 1.7–7.7)
Neutrophils Relative %: 69 %
Platelets: 219 10*3/uL (ref 150–400)
RBC: 3.47 MIL/uL — ABNORMAL LOW (ref 3.87–5.11)
RDW: 14.8 % (ref 11.5–15.5)
WBC: 15.9 10*3/uL — ABNORMAL HIGH (ref 4.0–10.5)
nRBC: 0 % (ref 0.0–0.2)

## 2019-10-25 LAB — COMPREHENSIVE METABOLIC PANEL
ALT: 13 U/L (ref 0–44)
AST: 23 U/L (ref 15–41)
Albumin: 1.5 g/dL — ABNORMAL LOW (ref 3.5–5.0)
Alkaline Phosphatase: 61 U/L (ref 38–126)
Anion gap: 7 (ref 5–15)
BUN: 15 mg/dL (ref 6–20)
CO2: 25 mmol/L (ref 22–32)
Calcium: 7.1 mg/dL — ABNORMAL LOW (ref 8.9–10.3)
Chloride: 104 mmol/L (ref 98–111)
Creatinine, Ser: 1.53 mg/dL — ABNORMAL HIGH (ref 0.44–1.00)
GFR calc Af Amer: 55 mL/min — ABNORMAL LOW (ref >60)
GFR calc non Af Amer: 47 mL/min — ABNORMAL LOW (ref >60)
Glucose, Bld: 134 mg/dL — ABNORMAL HIGH (ref 70–99)
Potassium: 4.2 mmol/L (ref 3.5–5.1)
Sodium: 136 mmol/L (ref 135–145)
Total Bilirubin: 0.7 mg/dL (ref 0.3–1.2)
Total Protein: 3.9 g/dL — ABNORMAL LOW (ref 6.5–8.1)

## 2019-10-25 LAB — GLUCOSE, CAPILLARY
Glucose-Capillary: 110 mg/dL — ABNORMAL HIGH (ref 70–99)
Glucose-Capillary: 116 mg/dL — ABNORMAL HIGH (ref 70–99)
Glucose-Capillary: 140 mg/dL — ABNORMAL HIGH (ref 70–99)
Glucose-Capillary: 43 mg/dL — CL (ref 70–99)
Glucose-Capillary: 83 mg/dL (ref 70–99)
Glucose-Capillary: 93 mg/dL (ref 70–99)
Glucose-Capillary: 97 mg/dL (ref 70–99)

## 2019-10-25 LAB — CBC
HCT: 20.7 % — ABNORMAL LOW (ref 36.0–46.0)
Hemoglobin: 7 g/dL — ABNORMAL LOW (ref 12.0–15.0)
MCH: 31 pg (ref 26.0–34.0)
MCHC: 33.8 g/dL (ref 30.0–36.0)
MCV: 91.6 fL (ref 80.0–100.0)
Platelets: 176 10*3/uL (ref 150–400)
RBC: 2.26 MIL/uL — ABNORMAL LOW (ref 3.87–5.11)
RDW: 15.1 % (ref 11.5–15.5)
WBC: 11.7 10*3/uL — ABNORMAL HIGH (ref 4.0–10.5)
nRBC: 0 % (ref 0.0–0.2)

## 2019-10-25 LAB — ANA: Anti Nuclear Antibody (ANA): NEGATIVE

## 2019-10-25 MED ORDER — SODIUM CHLORIDE 0.9% IV SOLUTION: Freq: Once | INTRAVENOUS | Status: AC

## 2019-10-25 MED ORDER — NIFEDIPINE ER OSMOTIC RELEASE 30 MG PO TB24
60.0000 mg | ORAL_TABLET | Freq: Every day | ORAL | Status: DC
  Administered 2019-10-26 – 2019-10-27 (×2): 60 mg via ORAL
  Filled 2019-10-25 (×2): qty 2

## 2019-10-25 MED ORDER — DIPHENHYDRAMINE HCL 25 MG PO CAPS
25.0000 mg | ORAL_CAPSULE | Freq: Once | ORAL | Status: AC
  Administered 2019-10-25: 25 mg via ORAL
  Filled 2019-10-25: qty 1

## 2019-10-25 MED ORDER — FUROSEMIDE 10 MG/ML IJ SOLN
40.0000 mg | Freq: Once | INTRAMUSCULAR | Status: AC
  Administered 2019-10-26: 40 mg via INTRAVENOUS
  Filled 2019-10-25: qty 4

## 2019-10-25 MED ORDER — FUROSEMIDE 10 MG/ML IJ SOLN
20.0000 mg | Freq: Once | INTRAMUSCULAR | Status: AC
  Administered 2019-10-25: 17:00:00 20 mg via INTRAVENOUS
  Filled 2019-10-25 (×2): qty 2

## 2019-10-25 MED ORDER — ACETAMINOPHEN 325 MG PO TABS
650.0000 mg | ORAL_TABLET | Freq: Once | ORAL | Status: AC
  Administered 2019-10-25: 11:00:00 650 mg via ORAL
  Filled 2019-10-25: qty 2

## 2019-10-25 MED ORDER — FUROSEMIDE 10 MG/ML IJ SOLN
20.0000 mg | Freq: Once | INTRAMUSCULAR | Status: AC
  Administered 2019-10-25: 20 mg via INTRAVENOUS

## 2019-10-25 NOTE — Progress Notes (Signed)
Hospital day #10 Post Partum Day 3 SVD POD #3 D&C for postpartum hemorrhage  Subjective: Patient reports feeling better today. she still has blurry vision. she denis sob  or chest pain. Edema is improving per patient but still present. She had right side lower quadrant and hip pain requiring oxycodone this morning. No headache.   Objective: Blood pressure 128/82, pulse 93, temperature 98.5 F (36.9 C), temperature source Oral, resp. rate 18, height 5' (1.524 m), weight 86.2 kg, last menstrual period 03/14/2019, SpO2 96 %, unknown if currently breastfeeding.   Intake/Output Summary (Last 24 hours) at 10/25/2019 1018 Last data filed at 10/25/2019 0820 Gross per 24 hour  Intake 900 ml  Output 3650 ml  Net -2750 ml   CBG (last 3)  Recent Labs    10/24/19 2050 10/25/19 0009 10/25/19 0616  GLUCAP 78 116* 140*   Physical Exam:  General: alert, cooperative and pale  Lungs: Clear to auscultation bilaterally with out RRW Heart: RRR Lochia: appropriate Uterine Fundus: firm Incision: NA Extremities 2+ edema half way up calf  DVT Evaluation: No evidence of DVT seen on physical exam.  Results for orders placed or performed during the hospital encounter of 10/15/19 (from the past 24 hour(s))  Glucose, capillary     Status: None   Collection Time: 10/24/19 10:34 AM  Result Value Ref Range   Glucose-Capillary 79 70 - 99 mg/dL  Glucose, capillary     Status: Abnormal   Collection Time: 10/24/19  3:39 PM  Result Value Ref Range   Glucose-Capillary 105 (H) 70 - 99 mg/dL  Glucose, capillary     Status: Abnormal   Collection Time: 10/24/19  8:31 PM  Result Value Ref Range   Glucose-Capillary 52 (L) 70 - 99 mg/dL  Glucose, capillary     Status: None   Collection Time: 10/24/19  8:50 PM  Result Value Ref Range   Glucose-Capillary 78 70 - 99 mg/dL  Glucose, capillary     Status: Abnormal   Collection Time: 10/25/19 12:09 AM  Result Value Ref Range   Glucose-Capillary 116 (H) 70 - 99 mg/dL   Glucose, capillary     Status: Abnormal   Collection Time: 10/25/19  6:16 AM  Result Value Ref Range   Glucose-Capillary 140 (H) 70 - 99 mg/dL  CBC     Status: Abnormal   Collection Time: 10/25/19  7:37 AM  Result Value Ref Range   WBC 11.7 (H) 4.0 - 10.5 K/uL   RBC 2.26 (L) 3.87 - 5.11 MIL/uL   Hemoglobin 7.0 (L) 12.0 - 15.0 g/dL   HCT 20.7 (L) 36.0 - 46.0 %   MCV 91.6 80.0 - 100.0 fL   MCH 31.0 26.0 - 34.0 pg   MCHC 33.8 30.0 - 36.0 g/dL   RDW 15.1 11.5 - 15.5 %   Platelets 176 150 - 400 K/uL   nRBC 0.0 0.0 - 0.2 %  Comprehensive metabolic panel     Status: Abnormal   Collection Time: 10/25/19  7:37 AM  Result Value Ref Range   Sodium 136 135 - 145 mmol/L   Potassium 4.2 3.5 - 5.1 mmol/L   Chloride 104 98 - 111 mmol/L   CO2 25 22 - 32 mmol/L   Glucose, Bld 134 (H) 70 - 99 mg/dL   BUN 15 6 - 20 mg/dL   Creatinine, Ser 1.53 (H) 0.44 - 1.00 mg/dL   Calcium 7.1 (L) 8.9 - 10.3 mg/dL   Total Protein 3.9 (L) 6.5 - 8.1  g/dL   Albumin 1.5 (L) 3.5 - 5.0 g/dL   AST 23 15 - 41 U/L   ALT 13 0 - 44 U/L   Alkaline Phosphatase 61 38 - 126 U/L   Total Bilirubin 0.7 0.3 - 1.2 mg/dL   GFR calc non Af Amer 47 (L) >60 mL/min   GFR calc Af Amer 55 (L) >60 mL/min   Anion gap 7 5 - 15    Assessment/Plan:   A/P 23yo [id number] s/p NSVD Day #3 complicated by:  -Preeclampsia with severe features-S/p Magnesium sulfate for seizure prophylaxis.             Increase Procardia XL 90 mg once daily to keep BP around goal of 130/80            Pt still has significant periorbital edema and 2+ pitting edema plan 20 mg lasix prior and post transfusion of 1 unit RBC ( for worsening anemia)    -Type I DM-well controlled with Levimir and Novolog AC meals.             Current accucheck as above, within normal range             Diabetic coordinator following.             Call Dr. Chalmers Cater, endocrinology, to arrange outpatient follow up.  -AKI/Stage 3 kidney disease             Continue strict I/Os,               Creatnine improving. Today 1.53             Monitor potassium with daily Lasix.  Potassium currently normal.  Nephrology following-  -Heme- anemia, PPH             EBL: ~ 1300cc- s/p TXA, cytotec, Hemabate, D&C             1upRBC given (total 4upRBC and 1u FFP)              Patient with decreasing hgb to 7.0 and continued lochia given CKD plan transfusion 1 unti RBC  -Cardio            Pericardial Effusion .             Await cardiology recommendations.  Recurrent Back spasms             PT cancelled consultation and suggested evaluation outpatient as this has been chronic               -Psych             Continue Lexapro 15 mg daily.             Pt is very thankful that she has come through the delivery.               Consult spiritual services for trauma experienced around delivery.  -Postpartum care             Tylenol and oxy for pain management, plan to avoid NSAIDs             Tolerating general diet             Last day of               Flagyl due to BV infection   LOS: 10 days   Christophe Louis 10/25/2019, 10:16 AM   [facility] [phone]

## 2019-10-25 NOTE — Progress Notes (Signed)
Nephrology Progress Note:   Patient ID: Layden Mcphatter, female   DOB: 01-Aug-1996, 24 y.o.   MRN: [id number]  S:  She had 4 liters UOP over 2/26.  Got lasix 20 mg IV x 2 doses today.  Still having trouble with her vision - blurry.  Swelling feels better.  Seeing her baby in NICU and pumping.  Got a unit of blood today.     Review of systems  Denies n/v Denies shortness of breath or chest pain  Denies dizziness   O:BP 127/78 (BP Location: Right Arm)   Pulse 97   Temp 98.3 F (36.8 C) (Oral)   Resp 17   Ht 5' (1.524 m)   Wt 86.2 kg   LMP 03/14/2019 (Exact Date)   SpO2 97%   Breastfeeding Unknown   BMI 37.13 kg/m   Intake/Output Summary (Last 24 hours) at 10/25/2019 1747 Last data filed at 10/25/2019 1722 Gross per 24 hour  Intake 1303.17 ml  Output 2550 ml  Net -1246.83 ml   Intake/Output: I/O last 3 completed shifts: In: [id number] [P.O.:1556] Out: 4700 [Urine:4700]  Intake/Output this shift:  Total I/O In: 1183.2 [P.O.:750; I.V.:11.2; Blood:422] Out: 77 [Urine:650] Weight change:    Physical exam:     Gen: adult female NAD  CVS: S1S2 no rub Resp: cta unlabored   Abd: nontender postpartum  Ext: 2+ lower extremity edema bilaterally - legs elevated Psych normal mood and affect.  Neuro - alert and oriented x 3; follows commands and provides history  Recent Labs  Lab 10/21/19 1721 10/22/19 0537 10/22/19 1207 10/22/19 2208 10/23/19 0610 10/24/19 0744 10/25/19 0737  NA 134* 134* 135 136 135 139 136  K 4.0 3.9 3.9 4.1 4.2 4.1 4.2  CL 106 105 104 107 106 109 104  CO2 21* 21* 22 22 22 23 25   GLUCOSE 160* 165* 103* 111* 98 116* 134*  BUN 16 16 14 15 14 15 15   CREATININE 1.68* 1.75* 1.90* 1.71* 1.77* 1.59* 1.53*  ALBUMIN 1.6* 1.6* 1.6* 1.4* 1.5* 1.4* 1.5*  CALCIUM 7.7* 7.3* 7.3* 6.9* 6.9* 7.0* 7.1*  AST 18 20 23 21 22 22 23   ALT 11 13 12 13 14 13 13    Liver Function Tests: Recent Labs  Lab 10/23/19 0610 10/24/19 0744 10/25/19 0737  AST 22 22 23   ALT 14 13 13    ALKPHOS 61 60 61  BILITOT 0.4 0.1* 0.7  PROT 3.7* 4.2* 3.9*  ALBUMIN 1.5* 1.4* 1.5*   CBC: Recent Labs  Lab 10/19/19 1718 10/20/19 0622 10/22/19 1713 10/22/19 1713 10/22/19 2208 10/22/19 2208 10/23/19 0610 10/24/19 0744 10/25/19 0737  WBC 9.3   < > 13.9*   < > 12.5*   < > 13.0* 12.6* 11.7*  NEUTROABS 6.3  --   --   --   --   --   --   --   --   HGB 8.2*   < > 7.2*   < > 6.6*   < > 8.1* 7.5* 7.0*  HCT 24.1*   < > 21.2*   < > 18.8*   < > 23.6* 21.7* 20.7*  MCV 93.1   < > 93.0  --  90.0  --  89.7 88.9 91.6  PLT 127*   < > 133*   < > 133*   < > 157 158 176   < > = values in this interval not displayed.    CBG: Recent Labs  Lab 10/25/19 0009 10/25/19 [id number] 10/25/19 1034 10/25/19 1508  10/25/19 1532  GLUCAP 116* 140* 97 43* 93    Iron Studies:  No results for input(s): IRON, TIBC, TRANSFERRIN, FERRITIN in the last 72 hours. Studies/Results: ECHOCARDIOGRAM LIMITED  Result Date: 10/24/2019    ECHOCARDIOGRAM LIMITED REPORT   Patient Name:   HARMONY Livers Date of Exam: 10/24/2019 Medical Rec #:  [id number]     Height:       60.0 in Accession #:    [id number]    Weight:       190.1 lb Date of Birth:  1995/11/28     BSA:          1.827 m Patient Age:    23 years      BP:           146/89 mmHg Patient Gender: F             HR:           97 bpm. Exam Location:  Inpatient Procedure: Limited Echo, Color Doppler and Cardiac Doppler Indications:    I31.3 Pericardial effusion (noninflammatory)  History:        Patient has prior history of Echocardiogram examinations, most                 recent 10/22/2019. Risk Factors:Diabetes and Pre-ecclampsia. 2                 days post-partum.  Sonographer:    Raquel Sarna Senior RDCS Referring Phys: [id number] [place]  1. Left ventricular ejection fraction, by estimation, is 60 to 65%. The left ventricle has normal function. The left ventricle has no regional wall motion abnormalities.  2. Right ventricular systolic function is normal. The  right ventricular size is normal. There is normal pulmonary artery systolic pressure.  3. No change in size of effusion sicne 10/22/19 There appears to be variation in mitral inflow with respiration but IVC collapses and is not very dilated suggesting no tamponade . Moderate pericardial effusion. The pericardial effusion is posterior to the left ventricle and posterior to the left ventricle and the left atrium.  4. The mitral valve is normal in structure and function. Trivial mitral valve regurgitation. No evidence of mitral stenosis.  5. The aortic valve is normal in structure and function. Aortic valve regurgitation is not visualized. No aortic stenosis is present.  6. The inferior vena cava is normal in size with greater than 50% respiratory variability, suggesting right atrial pressure of 3 mmHg. FINDINGS  Left Ventricle: Left ventricular ejection fraction, by estimation, is 60 to 65%. The left ventricle has normal function. The left ventricle has no regional wall motion abnormalities. The left ventricular internal cavity size was normal in size. There is  no left ventricular hypertrophy. Right Ventricle: The right ventricular size is normal. No increase in right ventricular wall thickness. Right ventricular systolic function is normal. There is normal pulmonary artery systolic pressure. The tricuspid regurgitant velocity is 2.36 m/s, and  with an assumed right atrial pressure of 3 mmHg, the estimated right ventricular systolic pressure is 99991111 mmHg. Left Atrium: Left atrial size was normal in size. Right Atrium: Right atrial size was normal in size. Pericardium: No change in size of effusion sicne 10/22/19 There appears to be variation in mitral inflow with respiration but IVC collapses and is not very dilated suggesting no tamponade. A moderately sized pericardial effusion is present. The pericardial effusion is posterior to the left ventricle and posterior to the left ventricle and the left atrium. Mitral  Valve: The mitral valve is normal in structure and function. Normal mobility of the mitral valve leaflets. Trivial mitral valve regurgitation. No evidence of mitral valve stenosis. Tricuspid Valve: The tricuspid valve is normal in structure. Tricuspid valve regurgitation is not demonstrated. No evidence of tricuspid stenosis. Aortic Valve: The aortic valve is normal in structure and function. Aortic valve regurgitation is not visualized. No aortic stenosis is present. Pulmonic Valve: The pulmonic valve was normal in structure. Pulmonic valve regurgitation is not visualized. No evidence of pulmonic stenosis. Aorta: The aortic root is normal in size and structure. Venous: The inferior vena cava is normal in size with greater than 50% respiratory variability, suggesting right atrial pressure of 3 mmHg. IAS/Shunts: No atrial level shunt detected by color flow Doppler.  TRICUSPID VALVE TR Peak grad:   22.3 mmHg TR Vmax:        236.00 cm/s Jenkins Rouge MD Electronically signed by Jenkins Rouge MD Signature Date/Time: 10/24/2019/9:01:16 AM    Final    . escitalopram  15 mg Oral Daily  . insulin aspart  0-6 Units Subcutaneous TID WC  . insulin aspart  2 Units Subcutaneous Q breakfast  . insulin aspart  3 Units Subcutaneous Q lunch  . insulin aspart  3 Units Subcutaneous Q supper  . insulin detemir  12 Units Subcutaneous Daily  . NIFEdipine  90 mg Oral Daily  . prenatal multivitamin  1 tablet Oral Q1200  . senna-docusate  2 tablet Oral Q24H  . Tdap  0.5 mL Intramuscular Once    BMET    Component Value Date/Time   NA 136 10/25/2019 0737   K 4.2 10/25/2019 0737   CL 104 10/25/2019 0737   CO2 25 10/25/2019 0737   GLUCOSE 134 (H) 10/25/2019 0737   BUN 15 10/25/2019 0737   CREATININE 1.53 (H) 10/25/2019 0737   CALCIUM 7.1 (L) 10/25/2019 0737   GFRNONAA 47 (L) 10/25/2019 0737   GFRAA 55 (L) 10/25/2019 0737   CBC    Component Value Date/Time   WBC 11.7 (H) 10/25/2019 0737   RBC 2.26 (L) 10/25/2019 0737    HGB 7.0 (L) 10/25/2019 0737   HCT 20.7 (L) 10/25/2019 0737   HCT 23.0 (L) 10/18/2019 0534   PLT 176 10/25/2019 0737   MCV 91.6 10/25/2019 0737   MCH 31.0 10/25/2019 0737   MCHC 33.8 10/25/2019 0737   RDW 15.1 10/25/2019 0737   LYMPHSABS 2.2 10/19/2019 1718   MONOABS 0.6 10/19/2019 1718   EOSABS 0.1 10/19/2019 1718   BASOSABS 0.0 10/19/2019 1718    Assessment/Plan: 1. Preeclampsia- nephrotic range proteinuria and HTN. Possible atypical HELLP per MFM.  1. On procardia  2. Asa 81 mg daily 3. Cr 1.53 4. Note thrombocytopenia  improving plt 176 5. Would defer addition of ACE/ARB for now - would need very reliable form of birth control prior to initiation.  Can reassess as an outpatient likely  2. HTN:   1. On procardia - reduce to 60 mg daily for now.  2. Lasix in AM  3.  Discussed importance of low salt diet. 3. Cardiomegaly - with pericardial effusion without tamponade. 1. Lasix IV x 2 doses today , 2/27 2. I have ordered lasix 40 mg IV for tomorrow am 2/28 3. She would benefit from continued observation and IV diuretics given the effusion and setting of CKD and nephrotic range proteinuria.  Cardiology evaluating.  ANA negative. 4. Cardiology planning outpatient follow-up and repeat TTE in two weeks per their note 4. AKI/CKD  stage 3- slight bump in Scr likely due to combination of preeclampsia as well as significant anemia; also had hypotension associated with epidural and lost blood with delivery 1. Cr 1.53 2. Continue to follow UOP and Scr 5. Type 1 DM- poorly controlled prior to pregnancy with improvement 6. CKD stage 3 - due to diabetic nephropathy.  Stressed importance of diabetic control with goal Hgb A1c <7%, use of an ACE or ARB once reliable birth control method in place.  Also discussed the need to avoid NSAIDs/Cox-II I"s. 1. She has established care at our office with Dr. Carolin Sicks.  7. Anemia- s/p blood transfusion. Hb 7.0  1. Got a unit of blood today  2. Acute blood  loss s/p transfusion after delivery   3. S/p IV feraheme 510 mg x 1 dose on 2/21  4. May require ESA although it will take weeks to take effect   Claudia Desanctis, MD  10/25/2019 6:11 PM

## 2019-10-25 NOTE — Progress Notes (Signed)
cbg before dinner was 52, Juice and dinner given, Rechecked at 2050 was 78. Held her insulin due to orders.  Randomly checked at 0100 it was 116.

## 2019-10-25 NOTE — Lactation Note (Signed)
This note was copied from a baby's chart. Lactation Consultation Note  Patient Name: Kathy Lewis [id number] Date: 10/25/2019 Reason for consult: Follow-up assessment;Primapara;1st time breastfeeding;NICU baby;Preterm <34wks;Infant < 6lbs;Maternal endocrine disorder Type of Endocrine Disorder?: Diabetes GDM, GHTN  LC in to visit with P1 Mom of [redacted]w[redacted]d baby in the NICU.  Baby 25 hrs old.  Mom is currently getting blood transfusion, Mom has a PPH after SVD of baby.  Mom has been pumping regularly and volume is increasing.  Both breasts are full and slightly engorged.  Mom states she last pumped 3 hrs ago.  Mom asked about latching baby with "inverted nipples".  Breasts are swollen and nipples are flattened currently.  No inversion noted.  Explained that when baby gets to the stage of being able to breastfeed, lactation will assist.  Mom has been very tired and not feeling well.  LC made a hand's free pumping bra from 2 mesh panties.  Mom eating an apple.  Assisted with double pumping with mesh bra on and Mom very appreciative.  Due to breasts filling, demonstrated use of regular setting on pump.  Mom's milk started spraying within 15 seconds, so demonstrated how to override the 2 minute stimulation phase.  Breasts softening with pumping.  Encouraged Mom to pump both breasts for 20-30 mins.     Engorgement prevention and treatment reviewed.  RN aware of breast fullness and need to pump regularly and possible ice and heat alternately to manage engorgement.  Mom has a [place] DEBP at home.  Mom has Medicaid and hadn't signed up for [facility].  WIC referral sent to [place] office.  Explained how the [facility] loaner would work if she is discharged on a weekend, but during the week, she can swing by the [facility] office after discharge.  Mom states she will call Monday morning to inquire about signing up for [facility].  Mom also aware of NICU DEBP available in baby's room.    Mom has held baby STS in the NICU and her  breasts started to leak.  Mom states baby licked at her nipple.  Mom started to tear up.   Praised Mom for her regular pumping and providing EBM for her preterm baby in the NICU.       Interventions Interventions: Breast feeding basics reviewed;Assisted with latch;Skin to skin;Breast massage;Hand express;DEBP;Expressed milk;Support pillows  Lactation Tools Discussed/Used Tools: Pump;Flanges Flange Size: 24 Breast pump type: Double-Electric Breast Pump   Consult Status Consult Status: Follow-up Date: 10/26/19 Follow-up type: In-patient    [name] 10/25/2019, 2:17 PM

## 2019-10-26 LAB — GLUCOSE, CAPILLARY
Glucose-Capillary: 236 mg/dL — ABNORMAL HIGH (ref 70–99)
Glucose-Capillary: 241 mg/dL — ABNORMAL HIGH (ref 70–99)
Glucose-Capillary: 37 mg/dL — CL (ref 70–99)
Glucose-Capillary: 66 mg/dL — ABNORMAL LOW (ref 70–99)
Glucose-Capillary: 213 mg/dL — ABNORMAL HIGH (ref 70–99)
Glucose-Capillary: 158 mg/dL — ABNORMAL HIGH (ref 70–99)

## 2019-10-26 LAB — COMPREHENSIVE METABOLIC PANEL
ALT: 16 U/L (ref 0–44)
AST: 23 U/L (ref 15–41)
Albumin: 1.5 g/dL — ABNORMAL LOW (ref 3.5–5.0)
Alkaline Phosphatase: 66 U/L (ref 38–126)
Anion gap: 7 (ref 5–15)
BUN: 15 mg/dL (ref 6–20)
CO2: 25 mmol/L (ref 22–32)
Calcium: 7.3 mg/dL — ABNORMAL LOW (ref 8.9–10.3)
Chloride: 104 mmol/L (ref 98–111)
Creatinine, Ser: 1.55 mg/dL — ABNORMAL HIGH (ref 0.44–1.00)
GFR calc Af Amer: 54 mL/min — ABNORMAL LOW (ref >60)
GFR calc non Af Amer: 47 mL/min — ABNORMAL LOW (ref >60)
Glucose, Bld: 200 mg/dL — ABNORMAL HIGH (ref 70–99)
Potassium: 4.4 mmol/L (ref 3.5–5.1)
Sodium: 136 mmol/L (ref 135–145)
Total Bilirubin: 0.6 mg/dL (ref 0.3–1.2)
Total Protein: 4.2 g/dL — ABNORMAL LOW (ref 6.5–8.1)

## 2019-10-26 MED ORDER — FUROSEMIDE 40 MG PO TABS
40.0000 mg | ORAL_TABLET | Freq: Every day | ORAL | Status: DC
  Administered 2019-10-27: 40 mg via ORAL
  Filled 2019-10-26: qty 1

## 2019-10-26 NOTE — Lactation Note (Signed)
This note was copied from a baby's chart. Lactation Consultation Note  Patient Name: Kathy Lewis [id number] Date: 10/26/2019   Attempted to visit with mom, but she wasn't in her room, she was in the NICU visiting her baby. LC to come back later to check on her pumping status.   Maternal Data    Feeding Feeding Type: Breast Milk  LATCH Score                   Interventions    Lactation Tools Discussed/Used     Consult Status      [name] 10/26/2019, 2:35 PM

## 2019-10-26 NOTE — Progress Notes (Signed)
Pt in NICU, has not had meal yet.  Will follow up when pt returns.

## 2019-10-26 NOTE — Progress Notes (Signed)
Inpatient Diabetes Program Recommendations  AACE/ADA: New Consensus Statement on Inpatient Glycemic Control (2015)  Target Ranges:  Prepandial:   less than 140 mg/dL      Peak postprandial:   less than 180 mg/dL (1-2 hours)      Critically ill patients:  140 - 180 mg/dL   Lab Results  Component Value Date   GLUCAP 110 (H) 10/25/2019   HGBA1C 12.1 (H) 12/18/2014    Review of Glycemic Control  Current orders for Inpatient glycemic control:  Levemir 12 units QD, Novolog 0-6 units tid + 2-3-3 units tid for meal coverage.  RN reports very poor intake. Had hypo of 43 mg/dL yesterday afternoon. Received no Novolog for entire day on 2/27.  RN paged regarding whether to give meal coverage if pt eats very little. Instructed to only give meal coverage if pt eats at least 50% of meal. Also instructed that correction insulin is given according to blood sugar, not po intake. Also recommended RN encourage pt to eat 3 meals per day to keep blood sugars from dropping or going high. Needs extra calories with breastfeeding.   Appreciative of call.  Will continue to follow closely.   Thank you. Lorenda Peck, RD, LDN, CDE Inpatient Diabetes Coordinator [phone]  I

## 2019-10-26 NOTE — Progress Notes (Signed)
Nephrology Progress Note:   Patient ID: Kathy Lewis, female   DOB: 27-May-1996, 24 y.o.   MRN: [id number]  S:  Pumping is going well.  Happy to be able to see her daughter.  Got lasix 40 mg IV this AM at 11:00 am.  Had 1.9 L (plus another 900 mL?) of UOP on 2/27.  Had another 1.2 liters UOP thus far today charted. Still has blurry vision.       Review of systems  Denies n/v Denies shortness of breath or chest pain  Denies dizziness   O:BP (!) 129/95   Pulse 93   Temp 98.6 F (37 C) (Oral)   Resp 18   Ht 5' (1.524 m)   Wt 86.2 kg   LMP 03/14/2019 (Exact Date)   SpO2 99%   Breastfeeding Unknown   BMI 37.13 kg/m   Intake/Output Summary (Last 24 hours) at 10/26/2019 1403 Last data filed at 10/26/2019 1109 Gross per 24 hour  Intake 902 ml  Output 2400 ml  Net -1498 ml   Intake/Output: I/O last 3 completed shifts: In: 1423.2 [P.O.:990; I.V.:11.2; Blood:422] Out: 3200 [Urine:3200]  Intake/Output this shift:  Total I/O In: 0  Out: 1200 [Urine:1200] Weight change:    Physical exam:     Gen: adult female NAD  CVS: S1S2 no rub Resp: cta unlabored   Abd: nontender postpartum  Ext: 1+ lower extremity edema bilaterally Psych normal mood and affect.  Neuro - alert and oriented x 3; follows commands and provides history  Recent Labs  Lab 10/22/19 0537 10/22/19 1207 10/22/19 2208 10/23/19 0610 10/24/19 0744 10/25/19 0737 10/26/19 0630  NA 134* 135 136 135 139 136 136  K 3.9 3.9 4.1 4.2 4.1 4.2 4.4  CL 105 104 107 106 109 104 104  CO2 21* 22 22 22 23 25 25   GLUCOSE 165* 103* 111* 98 116* 134* 200*  BUN 16 14 15 14 15 15 15   CREATININE 1.75* 1.90* 1.71* 1.77* 1.59* 1.53* 1.55*  ALBUMIN 1.6* 1.6* 1.4* 1.5* 1.4* 1.5* 1.5*  CALCIUM 7.3* 7.3* 6.9* 6.9* 7.0* 7.1* 7.3*  AST 20 23 21 22 22 23 23   ALT 13 12 13 14 13 13 16    Liver Function Tests: Recent Labs  Lab 10/24/19 0744 10/25/19 0737 10/26/19 0630  AST 22 23 23   ALT 13 13 16   ALKPHOS 60 61 66  BILITOT 0.1* 0.7  0.6  PROT 4.2* 3.9* 4.2*  ALBUMIN 1.4* 1.5* 1.5*   CBC: Recent Labs  Lab 10/19/19 1718 10/20/19 0622 10/22/19 2208 10/22/19 2208 10/23/19 0610 10/23/19 0610 10/24/19 0744 10/25/19 0737 10/25/19 1938  WBC 9.3   < > 12.5*   < > 13.0*   < > 12.6* 11.7* 15.9*  NEUTROABS 6.3  --   --   --   --   --   --   --  10.9*  HGB 8.2*   < > 6.6*   < > 8.1*   < > 7.5* 7.0* 10.6*  HCT 24.1*   < > 18.8*   < > 23.6*   < > 21.7* 20.7* 31.3*  MCV 93.1   < > 90.0  --  89.7  --  88.9 91.6 90.2  PLT 127*   < > 133*   < > 157   < > 158 176 219   < > = values in this interval not displayed.    CBG: Recent Labs  Lab 10/25/19 1532 10/25/19 2048 10/25/19 2350 10/26/19 1053 10/26/19  [place] 83 110* 236* 241*   Studies/Results: No results found. Marland Kitchen escitalopram  15 mg Oral Daily  . insulin aspart  0-6 Units Subcutaneous TID WC  . insulin aspart  2 Units Subcutaneous Q breakfast  . insulin aspart  3 Units Subcutaneous Q lunch  . insulin aspart  3 Units Subcutaneous Q supper  . insulin detemir  12 Units Subcutaneous Daily  . NIFEdipine  60 mg Oral Daily  . prenatal multivitamin  1 tablet Oral Q1200  . senna-docusate  2 tablet Oral Q24H  . Tdap  0.5 mL Intramuscular Once    BMET    Component Value Date/Time   NA 136 10/26/2019 0630   K 4.4 10/26/2019 0630   CL 104 10/26/2019 0630   CO2 25 10/26/2019 0630   GLUCOSE 200 (H) 10/26/2019 0630   BUN 15 10/26/2019 0630   CREATININE 1.55 (H) 10/26/2019 0630   CALCIUM 7.3 (L) 10/26/2019 0630   GFRNONAA 47 (L) 10/26/2019 0630   GFRAA 54 (L) 10/26/2019 0630   CBC    Component Value Date/Time   WBC 15.9 (H) 10/25/2019 1938   RBC 3.47 (L) 10/25/2019 1938   HGB 10.6 (L) 10/25/2019 1938   HCT 31.3 (L) 10/25/2019 1938   HCT 23.0 (L) 10/18/2019 0534   PLT 219 10/25/2019 1938   MCV 90.2 10/25/2019 1938   MCH 30.5 10/25/2019 1938   MCHC 33.9 10/25/2019 1938   RDW 14.8 10/25/2019 1938   LYMPHSABS 3.9 10/25/2019 1938   MONOABS 0.8  10/25/2019 1938   EOSABS 0.2 10/25/2019 1938   BASOSABS 0.1 10/25/2019 1938    Assessment/Plan: 1. Preeclampsia- nephrotic range proteinuria and HTN. Possible atypical HELLP per MFM.  1. On procardia  2. Asa 81 mg daily 3. Cr 1.55 4. Note thrombocytopenia  improving plt 219 5. Would defer addition of ACE/ARB for now - would need very reliable form of birth control prior to initiation.  Can reassess as an outpatient likely  2. HTN:   1. Appears was 129/95 prior to getting her lasix 40 mg IV and her nifedipine earlier today and has had a excellent urine output 2. On procardia - have reduced to 60 mg daily for now.  If pressures up tomorrow go back to procardia 90 mg daily 3.  Discussed importance of low salt diet. 4. Transition to PO Lasix as below 3. Cardiomegaly - with pericardial effusion without tamponade. 1. Lasix 40 mg IV today once (given) 2. Would plan for lasix 40 mg PO daily x 1 week to be reassessed at her hospital follow-up with nephrology. 3. She would benefit from continued observation and IV diuretics given the effusion and setting of CKD and nephrotic range proteinuria.  Cardiology evaluating.  ANA negative. 4. Cardiology planning outpatient follow-up and repeat TTE in two weeks per their note 4. AKI/CKD stage 3- slight bump in Scr likely due to combination of preeclampsia as well as significant anemia; also had hypotension associated with epidural and lost blood with delivery 1. Cr 1.55 2. Continue to follow UOP and Scr 5. Type 1 DM- poorly controlled prior to pregnancy with improvement 6. CKD stage 3 - due to diabetic nephropathy.  Stressed importance of diabetic control with goal Hgb A1c <7%, use of an ACE or ARB once reliable birth control method in place.  Also discussed the need to avoid NSAIDs/Cox-II I"s. 1. She has established care at our office with Dr. Carolin Sicks.  7. Anemia- s/p blood transfusion. 1. Got a unit  of blood on 2/27 with response 2. Acute blood loss  s/p transfusion after delivery   3. S/p IV feraheme 510 mg x 1 dose on 2/21   Would recommend follow-up with Dr. Carolin Sicks one week after discharge.  Would plan for lasix 40 mg daily starting 3/1 x 1 week.    Claudia Desanctis, MD  10/26/2019 2:16 PM

## 2019-10-26 NOTE — Progress Notes (Signed)
Pt has not had breakfast.  Reminded to eat 3 meals a day.  Will not treat with meal coverage at this time, pt states she will eat lunch around 1pm.  Pt reminded to call out for insulin and BS check.

## 2019-10-26 NOTE — Progress Notes (Addendum)
Post Partum Day 4 SVD POD #4 D&C Subjective: Patient still reports blurred visiton. no headache , no cp or sob.. states she feels better than yesteday except blurred vision.   Objective: Blood pressure (!) 129/95, pulse 93, temperature 98.6 F (37 C), temperature source Oral, resp. rate 18, height 5' (1.524 m), weight 86.2 kg, last menstrual period 03/14/2019, SpO2 99 %, unknown if currently breastfeeding.   I/O last 3 completed shifts: In: 1423.2 [P.O.:990; I.V.:11.2; Blood:422] Out: 3200 [Urine:3200] Total I/O In: 0  Out: 1200 [Urine:1200]   CBG (last 3)  Recent Labs    10/25/19 2350 10/26/19 1053 10/26/19 1109  GLUCAP 110* 236* 241*    Physical Exam:  General: alert, cooperative and no distress Lochia: appropriate Uterine Fundus: firm Incision: NA DVT Evaluation: No evidence of DVT seen on physical exam. Extremities 1+ edema in right leg 1-2 + in left leg ( per patient left leg is always more swollen)   Results for orders placed or performed during the hospital encounter of 10/15/19 (from the past 24 hour(s))  Glucose, capillary     Status: Abnormal   Collection Time: 10/25/19  3:08 PM  Result Value Ref Range   Glucose-Capillary 43 (LL) 70 - 99 mg/dL   Comment 1 Notify RN   Glucose, capillary     Status: None   Collection Time: 10/25/19  3:32 PM  Result Value Ref Range   Glucose-Capillary 93 70 - 99 mg/dL  CBC with Differential/Platelet     Status: Abnormal   Collection Time: 10/25/19  7:38 PM  Result Value Ref Range   WBC 15.9 (H) 4.0 - 10.5 K/uL   RBC 3.47 (L) 3.87 - 5.11 MIL/uL   Hemoglobin 10.6 (L) 12.0 - 15.0 g/dL   HCT 31.3 (L) 36.0 - 46.0 %   MCV 90.2 80.0 - 100.0 fL   MCH 30.5 26.0 - 34.0 pg   MCHC 33.9 30.0 - 36.0 g/dL   RDW 14.8 11.5 - 15.5 %   Platelets 219 150 - 400 K/uL   nRBC 0.0 0.0 - 0.2 %   Neutrophils Relative % 69 %   Neutro Abs 10.9 (H) 1.7 - 7.7 K/uL   Lymphocytes Relative 25 %   Lymphs Abs 3.9 0.7 - 4.0 K/uL   Monocytes Relative 5  %   Monocytes Absolute 0.8 0.1 - 1.0 K/uL   Eosinophils Relative 1 %   Eosinophils Absolute 0.2 0.0 - 0.5 K/uL   Basophils Relative 0 %   Basophils Absolute 0.1 0.0 - 0.1 K/uL   Immature Granulocytes 0 %   Abs Immature Granulocytes 0.05 0.00 - 0.07 K/uL  Glucose, capillary     Status: None   Collection Time: 10/25/19  8:48 PM  Result Value Ref Range   Glucose-Capillary 83 70 - 99 mg/dL  Glucose, capillary     Status: Abnormal   Collection Time: 10/25/19 11:50 PM  Result Value Ref Range   Glucose-Capillary 110 (H) 70 - 99 mg/dL  Comprehensive metabolic panel     Status: Abnormal   Collection Time: 10/26/19  6:30 AM  Result Value Ref Range   Sodium 136 135 - 145 mmol/L   Potassium 4.4 3.5 - 5.1 mmol/L   Chloride 104 98 - 111 mmol/L   CO2 25 22 - 32 mmol/L   Glucose, Bld 200 (H) 70 - 99 mg/dL   BUN 15 6 - 20 mg/dL   Creatinine, Ser 1.55 (H) 0.44 - 1.00 mg/dL   Calcium 7.3 (L) 8.9 -  10.3 mg/dL   Total Protein 4.2 (L) 6.5 - 8.1 g/dL   Albumin 1.5 (L) 3.5 - 5.0 g/dL   AST 23 15 - 41 U/L   ALT 16 0 - 44 U/L   Alkaline Phosphatase 66 38 - 126 U/L   Total Bilirubin 0.6 0.3 - 1.2 mg/dL   GFR calc non Af Amer 47 (L) >60 mL/min   GFR calc Af Amer 54 (L) >60 mL/min   Anion gap 7 5 - 15  Glucose, capillary     Status: Abnormal   Collection Time: 10/26/19 10:53 AM  Result Value Ref Range   Glucose-Capillary 236 (H) 70 - 99 mg/dL  Glucose, capillary     Status: Abnormal   Collection Time: 10/26/19 11:09 AM  Result Value Ref Range   Glucose-Capillary 241 (H) 70 - 99 mg/dL    Assessment/Plan:  23yo [id number] s/p NSVDDay #4complicated by:  -Preeclampsia with severe features-S/p Magnesium sulfate for seizure prophylaxis.  Procardia decreased to 60 mg daily starting today per nephrology to keep BP around goal of 130/80   Edema seems to be improving = pt received 40 mg lasix this morning per nephrology   -Type I DM- continue  Levimirand Novolog AC meals.  However may need to be adjusted given cbg greater than 200 Diabetic coordinator managing-  Episode of hypoglycemia earlier today with recent hyperglycemia.Marland Kitchen will defer to diabetic coordinator to adjust insulin as            needed   Call Dr. Chalmers Lewis, endocrinology, to arrange outpatient follow up.  -AKI/Stage 3 kidney disease Continue strict I/Os,  Creatnine was  improving. Slight bump Today 1.55 from 1.53 yesterday  Monitor potassium with daily Lasix. Potassium currently normal.             Nephrology following-  -Heme- anemia, PPH EBL: ~ 1300cc- s/p TXA, cytotec, Hemabate, D&C 1upRBC given yesterday  (total 5upRBC and 1u FFP)  Hgb increased from 7.0-10.6 after 1 unit rbc yesterday   -Cardio Pericardial Effusion . Await cardiology recommendations.  Recurrent Back spasms  PT cancelled consultation and suggested evaluation outpatient as this has been chronic   -Psych Continue Lexapro 15 mgdaily. Pt is very thankful that she has come through the delivery.  Consult spiritual services for trauma experienced around delivery.  -Postpartum care Tylenol and oxy for pain management, plan to avoid NSAIDs Tolerating general diet Probable discharge home tomorrow. Dr Kathy Lewis to assume care at [facility].    LOS: 11 days   Christophe Louis 10/26/2019, 1:42 PM

## 2019-10-26 NOTE — Progress Notes (Signed)
Pt returned from NICU and states that she feels like her blood sugar is low.  Pt has only had an apple early this am, with no other food or water.  BS taken and is low, pt drinking juice, will recheck in 15 minutes.  Pt encouraged to eat dinner with carbs and protein.

## 2019-10-27 LAB — BASIC METABOLIC PANEL
Anion gap: 6 (ref 5–15)
BUN: 13 mg/dL (ref 6–20)
CO2: 27 mmol/L (ref 22–32)
Calcium: 7.6 mg/dL — ABNORMAL LOW (ref 8.9–10.3)
Chloride: 107 mmol/L (ref 98–111)
Creatinine, Ser: 1.52 mg/dL — ABNORMAL HIGH (ref 0.44–1.00)
GFR calc Af Amer: 55 mL/min — ABNORMAL LOW (ref >60)
GFR calc non Af Amer: 48 mL/min — ABNORMAL LOW (ref >60)
Glucose, Bld: 171 mg/dL — ABNORMAL HIGH (ref 70–99)
Potassium: 4.6 mmol/L (ref 3.5–5.1)
Sodium: 140 mmol/L (ref 135–145)

## 2019-10-27 LAB — CBC
HCT: 26.2 % — ABNORMAL LOW (ref 36.0–46.0)
Hemoglobin: 8.8 g/dL — ABNORMAL LOW (ref 12.0–15.0)
MCH: 31.1 pg (ref 26.0–34.0)
MCHC: 33.6 g/dL (ref 30.0–36.0)
MCV: 92.6 fL (ref 80.0–100.0)
Platelets: 222 10*3/uL (ref 150–400)
RBC: 2.83 MIL/uL — ABNORMAL LOW (ref 3.87–5.11)
RDW: 14.6 % (ref 11.5–15.5)
WBC: 10.1 10*3/uL (ref 4.0–10.5)
nRBC: 0 % (ref 0.0–0.2)

## 2019-10-27 LAB — GLUCOSE, CAPILLARY
Glucose-Capillary: 78 mg/dL (ref 70–99)
Glucose-Capillary: 121 mg/dL — ABNORMAL HIGH (ref 70–99)

## 2019-10-27 MED ORDER — INSULIN ASPART 100 UNIT/ML ~~LOC~~ SOLN: 2.0000 [IU] | Freq: Every day | SUBCUTANEOUS | 11 refills | Status: DC

## 2019-10-27 MED ORDER — INSULIN DETEMIR 100 UNIT/ML ~~LOC~~ SOLN: 10.0000 [IU] | Freq: Every day | SUBCUTANEOUS | 11 refills | Status: DC

## 2019-10-27 MED ORDER — ESCITALOPRAM OXALATE 5 MG PO TABS: 15.0000 mg | ORAL_TABLET | Freq: Every day | ORAL | 2 refills | Status: DC

## 2019-10-27 MED ORDER — OXYCODONE HCL 5 MG PO TABS: 5.0000 mg | ORAL_TABLET | ORAL | 0 refills | Status: DC | PRN

## 2019-10-27 MED ORDER — INSULIN ASPART 100 UNIT/ML ~~LOC~~ SOLN: 3.0000 [IU] | Freq: Every day | SUBCUTANEOUS | 11 refills | Status: DC

## 2019-10-27 MED ORDER — POTASSIUM CHLORIDE ER 10 MEQ PO TBCR: 10.0000 meq | EXTENDED_RELEASE_TABLET | Freq: Every day | ORAL | 0 refills | Status: DC

## 2019-10-27 MED ORDER — RHO D IMMUNE GLOBULIN 1500 UNIT/2ML IJ SOSY
300.0000 ug | PREFILLED_SYRINGE | Freq: Once | INTRAMUSCULAR | Status: AC
  Administered 2019-10-27: 300 ug via INTRAMUSCULAR
  Filled 2019-10-27: qty 2

## 2019-10-27 MED ORDER — FUROSEMIDE 40 MG PO TABS: 40.0000 mg | ORAL_TABLET | Freq: Every day | ORAL | 0 refills | Status: DC

## 2019-10-27 MED ORDER — NIFEDIPINE ER 60 MG PO TB24: 60.0000 mg | ORAL_TABLET | Freq: Every day | ORAL | 2 refills | Status: DC

## 2019-10-27 MED ORDER — "INSULIN SYRINGE 31G X 5/16"" 0.3 ML MISC": 11 refills | Status: DC

## 2019-10-27 MED ORDER — ACETAMINOPHEN 325 MG PO TABS: 650.0000 mg | ORAL_TABLET | ORAL | 1 refills | Status: DC | PRN

## 2019-10-27 MED FILL — POTASSIUM CHL ER M10 TABLET: 10 | 7 days supply | Qty: 7 | Fill #0

## 2019-10-27 MED FILL — LEVEMIR 100 UNITS/ML VIAL: 100 | 30 days supply | Qty: 10 | Fill #0

## 2019-10-27 MED FILL — NIFEdipine ER 60 MG TB24: 60 | 30 days supply | Qty: 30 | Fill #0

## 2019-10-27 MED FILL — ACETAMINOPHEN 325 MG TABS: 325 | 3 days supply | Qty: 30 | Fill #0

## 2019-10-27 MED FILL — ESCITALOPRAM 10 MG TABLET: 10 | 30 days supply | Qty: 45 | Fill #0

## 2019-10-27 MED FILL — NovoLOG 100 UNIT/ML SOLN: 100 | 30 days supply | Qty: 10 | Fill #0

## 2019-10-27 MED FILL — oxyCODONE HCL 5 MG TABS: 5 | 1 days supply | Qty: 4 | Fill #0

## 2019-10-27 MED FILL — ULTICARE INS 0.3 ML 30GX1/2: 30G X 1/2" | 30 days supply | Qty: 100 | Fill #0

## 2019-10-27 MED FILL — FUROSEMIDE 40 MG TABLET: 40 | 7 days supply | Qty: 7 | Fill #0

## 2019-10-27 NOTE — Lactation Note (Addendum)
This note was copied from a baby's chart. Lactation Consultation Note  Patient Name: Kathy Lewis [id number] Date: 10/27/2019 Reason for consult: Follow-up assessment;Preterm <34wks;Infant < 6lbs;Primapara;1st time breastfeeding;NICU baby  P1 mother whose infant is now 53 days old.  This is a preterm baby born at 30+5 weeks with a CGA of 31+3 weeks weighing <4 lbs and in the NICU.  Mother had a Hallam after delivery.  She has been pumping regularly and obtaining approximately 30-45 mls per session.  She has two bottles of EBM currently in her refrigerator.  Praised mother for her efforts and encouraged her to continue providing EBM for her baby.  Suggested using EBM/coconut oil for nipple comfort.  Mother's breasts are soft and non tender and she denies pain with pumping.    Mother aware that she can pump at baby's bedside in the NICU.  I also suggested doing lots of STS with baby when she is able to visit.  Mother stated that her breasts started leaking when baby was STS during one of her visits.  She smiled and was pleased.  Informed mother that when baby is ready to attempt breast feeding in the NICU the lactation consultants will be available to assist with latching.  Mother stated she will need help with this.  Reassurance provided.  No support person present at this time.    Mother has a DEBP for home use.     Maternal Data    Feeding Feeding Type: Breast Milk  LATCH Score                   Interventions    Lactation Tools Discussed/Used     Consult Status Consult Status: Follow-up Date: 10/28/19 Follow-up type: In-patient    Therisa Mennella R Mckynlie Vanderslice 10/27/2019, 8:31 AM

## 2019-10-27 NOTE — Progress Notes (Signed)
Inpatient Diabetes Program Recommendations  AACE/ADA: New Consensus Statement on Inpatient Glycemic Control   Target Ranges:  Prepandial:   less than 140 mg/dL      Peak postprandial:   less than 180 mg/dL (1-2 hours)      Critically ill patients:  140 - 180 mg/dL  Results for BROOKELYNN, BERGHORST (MRN [id number]) as of 10/27/2019 09:08  Ref. Range 10/27/2019 05:19 10/27/2019 08:52  Glucose-Capillary Latest Ref Range: 70 - 99 mg/dL 78 121 (H)   Results for NATASIA, HEROUX (MRN [id number]) as of 10/27/2019 09:08  Ref. Range 10/26/2019 10:53 10/26/2019 11:09 10/26/2019 17:20 10/26/2019 17:46 10/26/2019 20:28 10/26/2019 22:01  Glucose-Capillary Latest Ref Range: 70 - 99 mg/dL 236 (H) 241 (H)   Levemir 12 units 37 (LL) 66 (L) 213 (H)  Novolog 5 units 158 (H)   Review of Glycemic Control   Current orders for Inpatient glycemic control: Levemir 12 units daily, Novolog 2 units with breakfast, Novolog 3 units with lunch, Novolog 3 units with supper, Novolog 0-6 units TID with meals  Inpatient Diabetes Program Recommendations:   Insulin-Basal: Please consider decreasing Levemir to 10 units daily.  NOTE: In reviewing chart, noted patient did not receive any Novolog meal coverage or correction with breakfast or lunch on 10/26/19 and glucose down to 37 mg/dl at 17:20 on 10/26/19.  Fasting glucose 78 mg/dl today at 5:19 am.   Thanks, Barnie Alderman, RN, MSN, CDE Diabetes Coordinator Inpatient Diabetes Program [phone] (Team Pager from 8am to 5pm)

## 2019-10-27 NOTE — Progress Notes (Addendum)
Spoke with Beverlee Nims in blood bank who verified that patient had not had Rhogam studies done since delivery.  [phone]) Paged Dr. Simona Huh. Awaiting return call.

## 2019-10-27 NOTE — Progress Notes (Addendum)
[facility] Progress Note    Assessment/ Plan:   1. Preeclampsia- nephrotic range proteinuria and HTN. Possible atypical HELLP per MFM.  1. On procardia  2. Asa 81 mg daily 3. Cr 1.55 4. Note thrombocytopenia  improving plt 219 5. Hold  addition of ACE/ARB for now - would need very reliable form of birth control prior to initiation.  Can reassess as an outpatient likely  2. HTN:   1. Appears was 129/95 prior to getting her lasix 40 mg IV and her nifedipine earlier today and has had a excellent urine output 2. On procardia - have reduced to 60 mg daily for now.  3.  Discussed importance of low salt diet. 4. Transition to PO Lasix as below -> Lasix 40mg  PO daily + KCL 88meq PO daily while on Lasix. 5. Daily weights and call [facility] [phone] if weights are increasing. 3. Cardiomegaly - with pericardial effusion without tamponade. 1. Would plan for lasix 40 mg PO daily x 1 week to be reassessed at her hospital follow-up with nephrology. 2. ANA negative. 3. Cardiology planning outpatient follow-up and repeat TTE in two weeks per their note 4. AKI/CKD stage 3- slight bump in Scr likely due to combination of preeclampsia as well as significant anemia; also had hypotension associated with epidural and lost blood with delivery 1. Cr 1.55 2. Continue to follow UOP and Scr  I've d/w Pam ([facility]) and she will call pt with f/u in 1-2 weeks with Dr. Carolin Sicks; she was a no show for an appt earlier in the year.   5. Type 1 DM- poorly controlled prior to pregnancy with improvement 6. CKD stage 3 - due to diabetic nephropathy. Stressed importance of diabetic control with goal Hgb A1c <7%, use of an ACE or ARB once reliable birth control method in place. Also discussed the need to avoid NSAIDs/Cox-II I"s. 1. She has established care at our office with Dr. Carolin Sicks.  7. Anemia- s/p blood transfusion. 1. Got a unit of blood on 2/27 with response 2. Acute blood loss s/p transfusion after  delivery   3. S/p IV feraheme 510 mg x 1 dose on 2/21   Subjective:   Feeling better, blurry vision still present but improved c/w yest   Objective:   BP (!) 136/92 (BP Location: Right Arm)   Pulse 94   Temp 98.2 F (36.8 C) (Oral)   Resp 18   Ht 5' (1.524 m)   Wt 86.2 kg   LMP 03/14/2019 (Exact Date)   SpO2 99%   Breastfeeding Unknown   BMI 37.13 kg/m   Intake/Output Summary (Last 24 hours) at 10/27/2019 1614 Last data filed at 10/27/2019 1118 Gross per 24 hour  Intake 1302 ml  Output 1600 ml  Net -298 ml   Weight change:   Physical Exam: Gen: adult female NAD  CVS: S1S2 no rub Resp: cta unlabored   Abd: nontender postpartum  Ext: 1+ lower extremity edema bilaterally Psych normal mood and affect.  Neuro - alert and oriented x 3; follows commands and provides history  Imaging: No results found.  Labs: BMET Recent Labs  Lab 10/22/19 1207 10/22/19 2208 10/23/19 0610 10/24/19 0744 10/25/19 0737 10/26/19 0630 10/27/19 0959  NA 135 136 135 139 136 136 140  K 3.9 4.1 4.2 4.1 4.2 4.4 4.6  CL 104 107 106 109 104 104 107  CO2 22 22 22 23 25 25 27   GLUCOSE 103* 111* 98 116* 134* 200* 171*  BUN 14 15  14 15 15 15 13   CREATININE 1.90* 1.71* 1.77* 1.59* 1.53* 1.55* 1.52*  CALCIUM 7.3* 6.9* 6.9* 7.0* 7.1* 7.3* 7.6*   CBC Recent Labs  Lab 10/24/19 0744 10/25/19 0737 10/25/19 1938 10/27/19 0959  WBC 12.6* 11.7* 15.9* 10.1  NEUTROABS  --   --  10.9*  --   HGB 7.5* 7.0* 10.6* 8.8*  HCT 21.7* 20.7* 31.3* 26.2*  MCV 88.9 91.6 90.2 92.6  PLT 158 176 219 222    Medications:    . escitalopram  15 mg Oral Daily  . furosemide  40 mg Oral Daily  . insulin aspart  0-6 Units Subcutaneous TID WC  . insulin aspart  2 Units Subcutaneous Q breakfast  . insulin aspart  3 Units Subcutaneous Q lunch  . insulin aspart  3 Units Subcutaneous Q supper  . insulin detemir  12 Units Subcutaneous Daily  . NIFEdipine  60 mg Oral Daily  . prenatal multivitamin  1 tablet Oral Q1200   . senna-docusate  2 tablet Oral Q24H  . Tdap  0.5 mL Intramuscular Once      Otelia Santee, MD 10/27/2019, 4:14 PM

## 2019-10-28 ENCOUNTER — Encounter (HOSPITAL_COMMUNITY): Payer: Self-pay | Admitting: Obstetrics & Gynecology

## 2019-10-28 ENCOUNTER — Other Ambulatory Visit: Payer: Self-pay

## 2019-10-28 ENCOUNTER — Ambulatory Visit (HOSPITAL_COMMUNITY): Payer: Managed Care, Other (non HMO)

## 2019-10-28 ENCOUNTER — Inpatient Hospital Stay (HOSPITAL_COMMUNITY)
Admission: AD | Admit: 2019-10-28 | Discharge: 2019-10-30 | Disposition: A | Discharge status: Home / Self Care | Payer: Managed Care, Other (non HMO) | Source: Home / Self Care | Attending: Obstetrics and Gynecology | Admitting: Obstetrics and Gynecology

## 2019-10-28 DIAGNOSIS — R509 Fever, unspecified: Secondary | ICD-10-CM | POA: Diagnosis present

## 2019-10-28 LAB — CBC
HCT: 25 % — ABNORMAL LOW (ref 36.0–46.0)
Hemoglobin: 8.5 g/dL — ABNORMAL LOW (ref 12.0–15.0)
MCH: 31.4 pg (ref 26.0–34.0)
MCHC: 34 g/dL (ref 30.0–36.0)
MCV: 92.3 fL (ref 80.0–100.0)
Platelets: 218 10*3/uL (ref 150–400)
RBC: 2.71 MIL/uL — ABNORMAL LOW (ref 3.87–5.11)
RDW: 14.4 % (ref 11.5–15.5)
WBC: 15.8 10*3/uL — ABNORMAL HIGH (ref 4.0–10.5)
nRBC: 0 % (ref 0.0–0.2)

## 2019-10-28 LAB — URINALYSIS, ROUTINE W REFLEX MICROSCOPIC
Bilirubin Urine: NEGATIVE
Glucose, UA: NEGATIVE mg/dL
Ketones, ur: NEGATIVE mg/dL
Leukocytes,Ua: NEGATIVE
Nitrite: NEGATIVE
Protein, ur: >=300 mg/dL — AB
Specific Gravity, Urine: 1.011 (ref 1.005–1.030)
pH: 8 (ref 5.0–8.0)

## 2019-10-28 LAB — COMPREHENSIVE METABOLIC PANEL
ALT: 14 U/L (ref 0–44)
AST: 20 U/L (ref 15–41)
Albumin: 1.8 g/dL — ABNORMAL LOW (ref 3.5–5.0)
Alkaline Phosphatase: 68 U/L (ref 38–126)
Anion gap: 9 (ref 5–15)
BUN: 15 mg/dL (ref 6–20)
CO2: 24 mmol/L (ref 22–32)
Calcium: 7.5 mg/dL — ABNORMAL LOW (ref 8.9–10.3)
Chloride: 106 mmol/L (ref 98–111)
Creatinine, Ser: 1.61 mg/dL — ABNORMAL HIGH (ref 0.44–1.00)
GFR calc Af Amer: 52 mL/min — ABNORMAL LOW (ref >60)
GFR calc non Af Amer: 45 mL/min — ABNORMAL LOW (ref >60)
Glucose, Bld: 54 mg/dL — ABNORMAL LOW (ref 70–99)
Potassium: 4.5 mmol/L (ref 3.5–5.1)
Sodium: 139 mmol/L (ref 135–145)
Total Bilirubin: 0.6 mg/dL (ref 0.3–1.2)
Total Protein: 4.6 g/dL — ABNORMAL LOW (ref 6.5–8.1)

## 2019-10-28 LAB — TYPE AND SCREEN
ABO/RH(D): O NEG
Antibody Screen: POSITIVE
Unit division: 0
Unit division: 0

## 2019-10-28 LAB — BPAM RBC
Blood Product Expiration Date: 202103112359
Blood Product Expiration Date: 202103112359
ISSUE DATE / TIME: 202102271320
Unit Type and Rh: 9500
Unit Type and Rh: 9500

## 2019-10-28 LAB — RH IG WORKUP (INCLUDES ABO/RH)
ABO/RH(D): O NEG
Fetal Screen: NEGATIVE
Gestational Age(Wks): 30
Unit division: 0

## 2019-10-28 LAB — POCT PREGNANCY, URINE: Preg Test, Ur: POSITIVE — AB

## 2019-10-28 LAB — GLUCOSE, CAPILLARY
Glucose-Capillary: 57 mg/dL — ABNORMAL LOW (ref 70–99)
Glucose-Capillary: 83 mg/dL (ref 70–99)

## 2019-10-28 LAB — LACTIC ACID, PLASMA: Lactic Acid, Venous: 1.1 mmol/L (ref 0.5–1.9)

## 2019-10-28 MED ORDER — SODIUM CHLORIDE 0.9 % IV SOLN
3.0000 g | INTRAVENOUS | Status: AC
  Administered 2019-10-28: 3 g via INTRAVENOUS
  Filled 2019-10-28: qty 8

## 2019-10-28 MED ORDER — DEXTROSE 5 % IN LACTATED RINGERS IV BOLUS: 1000.0000 mL | Freq: Once | INTRAVENOUS | Status: DC

## 2019-10-28 MED ORDER — ZOLPIDEM TARTRATE 5 MG PO TABS: 5.0000 mg | ORAL_TABLET | Freq: Every evening | ORAL | Status: DC | PRN

## 2019-10-28 MED ORDER — SENNOSIDES-DOCUSATE SODIUM 8.6-50 MG PO TABS
1.0000 | ORAL_TABLET | Freq: Once | ORAL | Status: DC
  Filled 2019-10-28: qty 1

## 2019-10-28 MED ORDER — DOCUSATE SODIUM 100 MG PO CAPS
100.0000 mg | ORAL_CAPSULE | Freq: Every day | ORAL | Status: DC
  Administered 2019-10-29 – 2019-10-30 (×2): 100 mg via ORAL
  Filled 2019-10-28 (×3): qty 1

## 2019-10-28 MED ORDER — ASPIRIN 81 MG PO CHEW
81.0000 mg | CHEWABLE_TABLET | Freq: Every day | ORAL | Status: DC
  Administered 2019-10-29 – 2019-10-30 (×2): 81 mg via ORAL
  Filled 2019-10-28 (×2): qty 1

## 2019-10-28 MED ORDER — FUROSEMIDE 40 MG PO TABS
40.0000 mg | ORAL_TABLET | Freq: Every day | ORAL | Status: DC
  Administered 2019-10-29 – 2019-10-30 (×2): 40 mg via ORAL
  Filled 2019-10-28 (×2): qty 1

## 2019-10-28 MED ORDER — SODIUM CHLORIDE 0.9 % IV SOLN: 3.0000 g | Freq: Four times a day (QID) | INTRAVENOUS | Status: DC

## 2019-10-28 MED ORDER — CALCIUM CARBONATE ANTACID 500 MG PO CHEW: 2.0000 | CHEWABLE_TABLET | ORAL | Status: DC | PRN

## 2019-10-28 MED ORDER — NIFEDIPINE ER OSMOTIC RELEASE 30 MG PO TB24
60.0000 mg | ORAL_TABLET | Freq: Every day | ORAL | Status: DC
  Administered 2019-10-29 – 2019-10-30 (×2): 60 mg via ORAL
  Filled 2019-10-28 (×3): qty 2

## 2019-10-28 MED ORDER — PRENATAL MULTIVITAMIN CH
1.0000 | ORAL_TABLET | Freq: Every day | ORAL | Status: DC
  Administered 2019-10-29: 15:00:00 1 via ORAL
  Filled 2019-10-28: qty 1

## 2019-10-28 MED ORDER — ACETAMINOPHEN 325 MG PO TABS
650.0000 mg | ORAL_TABLET | ORAL | Status: DC | PRN
  Filled 2019-10-28: qty 2

## 2019-10-28 MED ORDER — INSULIN ASPART 100 UNIT/ML ~~LOC~~ SOLN: 3.0000 [IU] | Freq: Three times a day (TID) | SUBCUTANEOUS | Status: DC

## 2019-10-28 MED ORDER — LACTATED RINGERS IV BOLUS
1000.0000 mL | Freq: Once | INTRAVENOUS | Status: AC
  Administered 2019-10-28: 21:00:00 1000 mL via INTRAVENOUS

## 2019-10-28 MED ORDER — FERROUS SULFATE 325 (65 FE) MG PO TABS
325.0000 mg | ORAL_TABLET | Freq: Every day | ORAL | Status: DC
  Administered 2019-10-29 – 2019-10-30 (×2): 325 mg via ORAL
  Filled 2019-10-28 (×2): qty 1

## 2019-10-28 MED ORDER — LACTATED RINGERS IV BOLUS: 1000.0000 mL | Freq: Once | INTRAVENOUS | Status: DC

## 2019-10-28 MED ORDER — LACTATED RINGERS IV BOLUS
1000.0000 mL | Freq: Once | INTRAVENOUS | Status: AC
  Administered 2019-10-28: 1000 mL via INTRAVENOUS

## 2019-10-28 MED ORDER — INSULIN DETEMIR 100 UNIT/ML ~~LOC~~ SOLN
10.0000 [IU] | Freq: Every day | SUBCUTANEOUS | Status: DC
  Administered 2019-10-29: 10 [IU] via SUBCUTANEOUS
  Filled 2019-10-28: qty 0.1

## 2019-10-28 MED ORDER — POTASSIUM CHLORIDE CRYS ER 10 MEQ PO TBCR
10.0000 meq | EXTENDED_RELEASE_TABLET | Freq: Every day | ORAL | Status: DC
  Administered 2019-10-29 – 2019-10-30 (×2): 10 meq via ORAL
  Filled 2019-10-28 (×2): qty 1

## 2019-10-28 MED ORDER — FERROUS SULFATE 325 (65 FE) MG PO TABS: 325.0000 mg | ORAL_TABLET | Freq: Two times a day (BID) | ORAL | Status: DC

## 2019-10-28 MED ORDER — ESCITALOPRAM OXALATE 5 MG PO TABS
15.0000 mg | ORAL_TABLET | Freq: Every day | ORAL | Status: DC
  Administered 2019-10-29 – 2019-10-30 (×2): 15 mg via ORAL
  Filled 2019-10-28 (×2): qty 1

## 2019-10-28 MED ORDER — SODIUM CHLORIDE 0.9 % IV SOLN
3.0000 g | Freq: Four times a day (QID) | INTRAVENOUS | Status: DC
  Administered 2019-10-29 – 2019-10-30 (×6): 3 g via INTRAVENOUS
  Filled 2019-10-28 (×6): qty 8

## 2019-10-28 MED ORDER — ACETAMINOPHEN 500 MG PO TABS
1000.0000 mg | ORAL_TABLET | Freq: Once | ORAL | Status: AC
  Administered 2019-10-28: 1000 mg via ORAL
  Filled 2019-10-28: qty 2

## 2019-10-28 NOTE — H&P (Signed)
[name] is a 24 y.o. year old G19P0111 female at PPx 6 days who presents to MAU reporting "feeling cold x 1 hour" prior to arrival to MAU. She was visiting her baby in the NICU and started not feeling well. She also started having N/V in MAU. She denies fever, but is shivering underneath 3 blankets. She has a h/o chronic kidney disease, Type 1 DM, hypokalemia, and PEC with SF. She had a PPH after delivery where she lost >1300 ml of blood, was taken to OR for U/S guided D&C and Bakri balloon placement. She also received a blood transfusion. She is an [place] patient. Pt admits to only eating a grapefruit today, endorse staking he morning levimir 10units in the morning, with 3units novolog TID today, BS was 57 in MAU and drank 8oz juice. Pt admits to taking her lexapro today for depression , mood stable, admits to taking her 40mg  lasix with 71meq of potassium today for cardiomegaly, denies cp, sob, denies URI s//sx or exposure to covid that she knows of.   Per [facility] course prior to admission on 10/28/2019:   MAU Course  Procedures  MDM LR bolus CBC Tylenol 1000 mg -- increasing temp  *Consult with Dr. Nehemiah Settle @ 2020 - notified of patient's complaints, assessments, & lab results, recommended tx plan call on-call MD to recommend PP admission, Dr. Nehemiah Settle ordering Lactic Acid, IV Unasyn, and blood cultures. Dr. Alesia Richards @ 2030 - notified of patient's complaints, assessments, lab results, & Dr. Glenna Durand recommended tx plan, and temp of now 102.7 30 mins after Tylenol. Dr. Alesia Richards agrees with plan for readmission and orders for COVID-19 testing. Dr. Alesia Richards will notify [facility], CNM to come get patient admitted.   Patient Active Problem List   Diagnosis Date Noted  . Fever 10/28/2019  . Preeclampsia 10/15/2019  . Severe anemia 10/13/2019  . Pre-existing type 1 diabetes mellitus with hyperglycemia during pregnancy in first trimester ([place]) 04/30/2019  . Hypokalemia   . DKA (diabetic  ketoacidoses) ([place]) 12/18/2014  . Leukocytosis 12/18/2014  . Noncompliance with medications 12/18/2014  . Type 1 diabetes mellitus with ketoacidosis, uncontrolled ([place]) 12/18/2014  . Nausea and vomiting 12/18/2014     Medications Prior to Admission  Medication Sig Dispense Refill Last Dose  . acetaminophen (TYLENOL) 500 MG tablet Take 500 mg by mouth every 6 (six) hours as needed for mild pain.   10/28/2019 at Unknown time  . escitalopram (LEXAPRO) 5 MG tablet Take 3 tablets (15 mg total) by mouth daily. 120 tablet 2 10/28/2019 at Unknown time  . furosemide (LASIX) 40 MG tablet Take 1 tablet (40 mg total) by mouth daily. 7 tablet 0 10/28/2019 at Unknown time  . insulin aspart (NOVOLOG) 100 UNIT/ML injection Inject 3 Units into the skin daily with lunch. 10 mL 11 10/28/2019 at Unknown time  . insulin aspart (NOVOLOG) 100 UNIT/ML injection Inject 3 Units into the skin daily with supper. 10 mL 11 10/28/2019 at Unknown time  . insulin aspart (NOVOLOG) 100 UNIT/ML injection Inject 2 Units into the skin daily with breakfast. 10 mL 11 10/28/2019 at Unknown time  . insulin detemir (LEVEMIR) 100 UNIT/ML injection Inject 0.1 mLs (10 Units total) into the skin daily. 10 mL 11 10/28/2019 at Unknown time  . Insulin Syringe-Needle U-100 (INSULIN SYRINGE .3CC/31GX5/16") 31G X 5/16" 0.3 ML MISC Use as directed 100 each 11 10/28/2019 at Unknown time  . NIFEdipine (ADALAT CC) 60 MG 24 hr tablet Take 1 tablet (60 mg total)  by mouth daily. 30 tablet 2 10/28/2019 at Unknown time  . ondansetron (ZOFRAN) 4 MG tablet Take 1 tablet (4 mg total) by mouth every 8 (eight) hours as needed for nausea or vomiting. 30 tablet 2 Past Week at Unknown time  . potassium chloride (KLOR-CON) 10 MEQ tablet Take 1 tablet (10 mEq total) by mouth daily. 7 tablet 0 10/28/2019 at Unknown time  . Prenatal Vit-Fe Fumarate-FA (PRENATAL MULTIVITAMIN) TABS tablet Take 1 tablet by mouth at bedtime.   10/28/2019 at Unknown time  . acetaminophen (TYLENOL) 325 MG  tablet Take 2 tablets (650 mg total) by mouth every 4 (four) hours as needed (for pain scale < 4). 30 tablet 1   . oxyCODONE (OXY IR/ROXICODONE) 5 MG immediate release tablet Take 1 tablet (5 mg total) by mouth every 4 (four) hours as needed (pain scale 4-7). 4 tablet 0 not taking    Past Medical History:  Diagnosis Date  . Anxiety   . Back spasm   . CKD (chronic kidney disease), stage III   . Diabetes mellitus without complication (HCC)    Type 1  . DKA (diabetic ketoacidoses) ([place])   . HSV infection    on valtrex  . Hypokalemia   . Leukocytosis   . Noncompliance with medication regimen   . Preeclampsia   . Severe anemia      No current facility-administered medications on file prior to encounter.   Current Outpatient Medications on File Prior to Encounter  Medication Sig Dispense Refill  . acetaminophen (TYLENOL) 500 MG tablet Take 500 mg by mouth every 6 (six) hours as needed for mild pain.    Marland Kitchen escitalopram (LEXAPRO) 5 MG tablet Take 3 tablets (15 mg total) by mouth daily. 120 tablet 2  . furosemide (LASIX) 40 MG tablet Take 1 tablet (40 mg total) by mouth daily. 7 tablet 0  . insulin aspart (NOVOLOG) 100 UNIT/ML injection Inject 3 Units into the skin daily with lunch. 10 mL 11  . insulin aspart (NOVOLOG) 100 UNIT/ML injection Inject 3 Units into the skin daily with supper. 10 mL 11  . insulin aspart (NOVOLOG) 100 UNIT/ML injection Inject 2 Units into the skin daily with breakfast. 10 mL 11  . insulin detemir (LEVEMIR) 100 UNIT/ML injection Inject 0.1 mLs (10 Units total) into the skin daily. 10 mL 11  . Insulin Syringe-Needle U-100 (INSULIN SYRINGE .3CC/31GX5/16") 31G X 5/16" 0.3 ML MISC Use as directed 100 each 11  . NIFEdipine (ADALAT CC) 60 MG 24 hr tablet Take 1 tablet (60 mg total) by mouth daily. 30 tablet 2  . ondansetron (ZOFRAN) 4 MG tablet Take 1 tablet (4 mg total) by mouth every 8 (eight) hours as needed for nausea or vomiting. 30 tablet 2  . potassium chloride  (KLOR-CON) 10 MEQ tablet Take 1 tablet (10 mEq total) by mouth daily. 7 tablet 0  . Prenatal Vit-Fe Fumarate-FA (PRENATAL MULTIVITAMIN) TABS tablet Take 1 tablet by mouth at bedtime.    Marland Kitchen acetaminophen (TYLENOL) 325 MG tablet Take 2 tablets (650 mg total) by mouth every 4 (four) hours as needed (for pain scale < 4). 30 tablet 1  . oxyCODONE (OXY IR/ROXICODONE) 5 MG immediate release tablet Take 1 tablet (5 mg total) by mouth every 4 (four) hours as needed (pain scale 4-7). 4 tablet 0     Allergies  Allergen Reactions  . [place] Extract Allergy Skin Test Itching    Mouth itching    . Nsaids     Avoid  per nephrology    OB History    Gravida  2   Para  1   Term      Preterm  1   AB  1   Living  1     SAB  1   TAB      Ectopic      Multiple  0   Live Births  1          Past Medical History:  Diagnosis Date  . Anxiety   . Back spasm   . CKD (chronic kidney disease), stage III   . Diabetes mellitus without complication (HCC)    Type 1  . DKA (diabetic ketoacidoses) ([place])   . HSV infection    on valtrex  . Hypokalemia   . Leukocytosis   . Noncompliance with medication regimen   . Preeclampsia   . Severe anemia    Past Surgical History:  Procedure Laterality Date  . DILATION AND EVACUATION N/A 10/22/2019   Procedure: ULTRASOUND GUIDED DILATATION AND EVACUATION;  Surgeon: [name], MD;  Location: [facility] LD ORS;  Service: Gynecology;  Laterality: N/A;  . NO PAST SURGERIES     Family History: She was adopted. Family history is unknown by patient. Social History:  reports that she has quit smoking. She has never used smokeless tobacco. She reports that she does not drink alcohol or use drugs.  Review of Systems  Constitutional: Positive for appetite change and chills ("feeling cold").  HENT: Negative.   Eyes: Negative.   Respiratory: Negative.   Cardiovascular: Negative.   Gastrointestinal: Positive for nausea and vomiting.  Endocrine: Negative.    Genitourinary: Negative.   Musculoskeletal: Negative.   Skin: Negative.   Allergic/Immunologic: Negative.   Neurological: Negative.   Hematological: Negative.   Psychiatric/Behavioral: Negative.  Review of Systems  Constitutional: Positive for appetite change and chills ("feeling cold").  HENT: Negative.   Eyes: Negative.   Respiratory: Negative.   Cardiovascular: Negative.   Gastrointestinal: Positive for nausea and vomiting.  Endocrine: Negative.   Genitourinary: Negative.   Musculoskeletal: Negative.   Skin: Negative.   Allergic/Immunologic: Negative.   Neurological: Negative.   Hematological: Negative.   Psychiatric/Behavioral: Negative.    Physical Exam: BP 133/74   Pulse (!) 109   Temp (!) 100.6 F (38.1 C) (Oral)   Resp 20   Breastfeeding Yes Comment: pumping, baby in NICU  Physical Exam  Constitutional: She is oriented to person, place, and time and well-developed, well-nourished, and in no distress.  HENT:  Head: Normocephalic and atraumatic.  Eyes: Pupils are equal, round, and reactive to light. Conjunctivae are normal.  Cardiovascular: Normal rate, regular rhythm and intact distal pulses.  Pulmonary/Chest: Effort normal and breath sounds normal.  Abdominal: Soft. Bowel sounds are normal. There is abdominal tenderness.  LLQ tenderness  Genitourinary:    Genitourinary Comments: Deferred, light rubra, uterus midline and -2, firm.    Musculoskeletal:        General: Edema present. Normal range of motion.     Cervical back: Normal range of motion and neck supple.     Comments: Bilateral lower extremities edema noted.   Neurological: She is alert and oriented to person, place, and time. She has normal reflexes. Gait normal.  No clonus   Skin: Skin is warm and dry.  Breast appear tight, nipples with milk exudate around nipple, Breast firm to touch and slightly tender. No erythema noted.   Psychiatric:  [place]  Nursing note and vitals reviewed.  Labs: Results for orders placed or performed during the hospital encounter of 10/28/19 (from the past 24 hour(s))  Glucose, capillary     Status: Abnormal   Collection Time: 10/28/19  7:28 PM  Result Value Ref Range   Glucose-Capillary 57 (L) 70 - 99 mg/dL  CBC     Status: Abnormal   Collection Time: 10/28/19  7:33 PM  Result Value Ref Range   WBC 15.8 (H) 4.0 - 10.5 K/uL   RBC 2.71 (L) 3.87 - 5.11 MIL/uL   Hemoglobin 8.5 (L) 12.0 - 15.0 g/dL   HCT 25.0 (L) 36.0 - 46.0 %   MCV 92.3 80.0 - 100.0 fL   MCH 31.4 26.0 - 34.0 pg   MCHC 34.0 30.0 - 36.0 g/dL   RDW 14.4 11.5 - 15.5 %   Platelets 218 150 - 400 K/uL   nRBC 0.0 0.0 - 0.2 %  Comprehensive metabolic panel     Status: Abnormal   Collection Time: 10/28/19  7:33 PM  Result Value Ref Range   Sodium 139 135 - 145 mmol/L   Potassium 4.5 3.5 - 5.1 mmol/L   Chloride 106 98 - 111 mmol/L   CO2 24 22 - 32 mmol/L   Glucose, Bld 54 (L) 70 - 99 mg/dL   BUN 15 6 - 20 mg/dL   Creatinine, Ser 1.61 (H) 0.44 - 1.00 mg/dL   Calcium 7.5 (L) 8.9 - 10.3 mg/dL   Total Protein 4.6 (L) 6.5 - 8.1 g/dL   Albumin 1.8 (L) 3.5 - 5.0 g/dL   AST 20 15 - 41 U/L   ALT 14 0 - 44 U/L   Alkaline Phosphatase 68 38 - 126 U/L   Total Bilirubin 0.6 0.3 - 1.2 mg/dL   GFR calc non Af Amer 45 (L) >60 mL/min   GFR calc Af Amer 52 (L) >60 mL/min   Anion gap 9 5 - 15  Type and screen [place]     Status: None   Collection Time: 10/28/19  7:41 PM  Result Value Ref Range   ABO/RH(D) O NEG    Antibody Screen POS    Sample Expiration      10/31/2019,2359 Performed at [facility], [address] [place], [place] [address]   Urinalysis, Routine w reflex microscopic     Status: Abnormal   Collection Time: 10/28/19  8:44 PM  Result Value Ref Range   Color, Urine YELLOW YELLOW   APPearance CLEAR CLEAR   Specific Gravity, Urine 1.011 1.005 - 1.030   pH 8.0 5.0 - 8.0   Glucose, UA NEGATIVE NEGATIVE mg/dL   Hgb urine dipstick SMALL  (A) NEGATIVE   Bilirubin Urine NEGATIVE NEGATIVE   Ketones, ur NEGATIVE NEGATIVE mg/dL   Protein, ur >=300 (A) NEGATIVE mg/dL   Nitrite NEGATIVE NEGATIVE   Leukocytes,Ua NEGATIVE NEGATIVE   RBC / HPF 11-20 0 - 5 RBC/hpf   WBC, UA 6-10 0 - 5 WBC/hpf   Bacteria, UA RARE (A) NONE SEEN   Squamous Epithelial / LPF 0-5 0 - 5   Mucus PRESENT   Lactic acid, plasma     Status: None   Collection Time: 10/28/19  8:45 PM  Result Value Ref Range   Lactic Acid, Venous 1.1 0.5 - 1.9 mmol/L  Pregnancy, urine POC     Status: Abnormal   Collection Time: 10/28/19  8:46 PM  Result Value Ref Range   Preg Test, Ur POSITIVE (A) NEGATIVE    Imaging:  [place] Intraoperative  Result Date: 10/22/2019 CLINICAL DATA:  Ultrasound was provided for use by the ordering physician, and a technical charge was applied by the performing facility.  No radiologist interpretation/professional services rendered.   [place] MFM OB Transvaginal  Result Date: 10/20/2019 ----------------------------------------------------------------------  OBSTETRICS REPORT                       (Signed Final 10/20/2019 10:41 am) ---------------------------------------------------------------------- Patient Info  ID #:       [id number]                          D.O.B.:  1995-10-04 (23 yrs)  Name:       Irene Shipper Lefebre                   Visit Date: 10/20/2019 09:22 am ---------------------------------------------------------------------- Performed By  Performed By:     Novella Rob        Ref. Address:     [address]                                                             [address]                                                             [place]  Attending:        Tama High MD        Secondary Phy.:   [facility]                                                             Care  Referred By:      Estill Bamberg                 [facility]         [facility] MD                               Fetal Care ---------------------------------------------------------------------- Orders   #  Description                          Code  Ordered By   1  [place] MFM FETAL BPP WO NON              [id number]     EVELYN VARNADO      STRESS   2  [place] MFM OB TRANSVAGINAL               [id number]      EVELYN VARNADO  ----------------------------------------------------------------------   #  Order #                    Accession #                 Episode #   1  [id number]                  [id number]   2  [id number]                  [id number]                  [id number]  ---------------------------------------------------------------------- Indications   Pre-eclampsia                                  O14.90   Cervical shortening, third trimester           O26.873   Polyhydramnios, third trimester, antepartum    O40.3XX0   condition or complication, unspecified fetus   Encounter for cervical length                  Z36.86   Pre-existing diabetes, type 1, in pregnancy,   O24.013   third trimester   [redacted] weeks gestation of pregnancy                Z3A.30  ---------------------------------------------------------------------- Fetal Evaluation  Num Of Fetuses:         1  Fetal Heart Rate(bpm):  133  Cardiac Activity:       Observed  Presentation:           Cephalic  Placenta:               Posterior  P. Cord Insertion:      Previously Visualized  Amniotic Fluid  AFI FV:      Polyhydramnios  AFI Sum(cm)     %Tile       Largest Pocket(cm)  26.11           > 97        8.79  RUQ(cm)       RLQ(cm)       LUQ(cm)        LLQ(cm)  6.12          6.22          8.79           4.98 ---------------------------------------------------------------------- Biophysical Evaluation  Amniotic F.V:   Within normal limits       F. Tone:        Observed  F. Movement:    Observed                   Score:          6/8  F. Breathing:   Not Observed  ---------------------------------------------------------------------- OB History  Gravidity:    2  Term:   0        Prem:   0        SAB:   1  TOP:          0       Ectopic:  0        Living: 0 ---------------------------------------------------------------------- Gestational Age  LMP:           31w 3d        Date:  03/14/19                 EDD:   12/19/19  Best:          Weston Settle 3d     Det. ByLoman Chroman         EDD:   12/26/19                                      (04/30/19) ---------------------------------------------------------------------- Cervix Uterus Adnexa  Cervix  Length:            0.4  cm.  See comments ---------------------------------------------------------------------- Impression  G2 P0.  Patient with type 1 diabetes with diabetic  nephropathy was admitted 5 days ago with diagnosis of  preeclampsia.  On ultrasound performed at admission, fetal  growth was appropriate for gestational age.  On today's ultrasound, polyhydramnios is seen (AFI= 26 cm).  Cephalic presentation.  Fetal breathing movements did not  meet the criteria of BPP.  I reviewed the NST which is  reactive.  BPP 6/8.  Because of the appearance of cervical shortening, we  performed transvaginal ultrasound to evaluate the cervix.  The cervical canal is dilated with a residual closed portion of  cervix measuring 4 millimeters.  Cervical shortening and polyhydramnios increase the risk of  preterm delivery. ---------------------------------------------------------------------- Recommendations  -Twice-weekly BPP (Mon-Thu). ----------------------------------------------------------------------                  Tama High, MD Electronically Signed Final Report   10/20/2019 10:41 am ----------------------------------------------------------------------  DG Chest Port 1 View  Addendum Date: 10/21/2019   ADDENDUM REPORT: 10/21/2019 21:05 ADDENDUM: Correction: The impression should read: "Marked enlargement of the cardiac  silhouette. Cardiomegaly versus pericardial effusion. The possibility of cardiomyopathy should be considered. Slight atelectasis at the left base posterior medially. The" Electronically Signed   By: Lorriane Shire M.D.   On: 10/21/2019 21:05   Result Date: 10/21/2019 CLINICAL DATA:  Severe preeclampsia. EXAM: PORTABLE CHEST 1 VIEW COMPARISON:  10/16/2018 FINDINGS: There is marked enlargement of the cardiac silhouette with a globular configuration. This could represent global cardiomegaly or a pericardial effusion. Slight distention of the azygos vein. No discrete infiltrates or effusions. No bone abnormality. IMPRESSION: Marked enlargement of the cardiac silhouette. Cardiomegaly versus pericardial effusion. Electronically Signed: By: Lorriane Shire M.D. On: 10/21/2019 20:26   [place] MFM FETAL BPP WO NON STRESS  Result Date: 10/20/2019 ----------------------------------------------------------------------  OBSTETRICS REPORT                       (Signed Final 10/20/2019 10:41 am) ---------------------------------------------------------------------- Patient Info  ID #:       [id number]                          D.O.B.:  02/22/1996 (23 yrs)  Name:       [facility] [place]  Visit Date: 10/20/2019 09:22 am ---------------------------------------------------------------------- Performed By  Performed By:     Novella Rob        Ref. Address:     [address]                                                             [address]                                                             [place]  Attending:        Tama High MD        Secondary Phy.:   [facility] OB [facility]ty                                                             Care  Referred By:      Estill Bamberg                 Location[facility]         [facility]                               Fetal Care  ---------------------------------------------------------------------- Orders   #  Description                          Code         Ordered By   1  [place] MFM FETAL BPP WO NON              [id number]     EVELYN VARNADO      STRESS   2  [place] MFM OB TRANSVAGINAL               [id number]      EVELYN VARNADO  ----------------------------------------------------------------------   #  Order #                    Accession #                 Episode #   1  [id number]                  [id number]  [id number]   2  [id number]                  [id number]                  [id number]  ---------------------------------------------------------------------- Indications   Pre-eclampsia                                  O14.90   Cervical shortening, third trimester           O26.873   Polyhydramnios, third trimester, antepartum    O40.3XX0   condition or complication, unspecified fetus   Encounter for cervical length                  Z36.86   Pre-existing diabetes, type 1, in pregnancy,   O24.013   third trimester   [redacted] weeks gestation of pregnancy                Z3A.30  ---------------------------------------------------------------------- Fetal Evaluation  Num Of Fetuses:         1  Fetal Heart Rate(bpm):  133  Cardiac Activity:       Observed  Presentation:           Cephalic  Placenta:               Posterior  P. Cord Insertion:      Previously Visualized  Amniotic Fluid  AFI FV:      Polyhydramnios  AFI Sum(cm)     %Tile       Largest Pocket(cm)  26.11           > 97        8.79  RUQ(cm)       RLQ(cm)       LUQ(cm)        LLQ(cm)  6.12          6.22          8.79           4.98 ---------------------------------------------------------------------- Biophysical Evaluation  Amniotic F.V:   Within normal limits       F. Tone:        Observed  F. Movement:    Observed                   Score:          6/8  F. Breathing:   Not Observed ---------------------------------------------------------------------- OB History  Gravidity:    2          Term:   0        Prem:   0        SAB:   1  TOP:          0       Ectopic:  0        Living: 0 ---------------------------------------------------------------------- Gestational Age  LMP:           31w 3d        Date:  03/14/19                 EDD:   12/19/19  Best:          Weston Settle 3d     Det. ByLoman Chroman         EDD:   12/26/19                                      (  04/30/19) ---------------------------------------------------------------------- Cervix Uterus Adnexa  Cervix  Length:            0.4  cm.  See comments ---------------------------------------------------------------------- Impression  G2 P0.  Patient with type 1 diabetes with diabetic  nephropathy was admitted 5 days ago with diagnosis of  preeclampsia.  On ultrasound performed at admission, fetal  growth was appropriate for gestational age.  On today's ultrasound, polyhydramnios is seen (AFI= 26 cm).  Cephalic presentation.  Fetal breathing movements did not  meet the criteria of BPP.  I reviewed the NST which is  reactive.  BPP 6/8.  Because of the appearance of cervical shortening, we  performed transvaginal ultrasound to evaluate the cervix.  The cervical canal is dilated with a residual closed portion of  cervix measuring 4 millimeters.  Cervical shortening and polyhydramnios increase the risk of  preterm delivery. ---------------------------------------------------------------------- Recommendations  -Twice-weekly BPP (Mon-Thu). ----------------------------------------------------------------------                  Tama High, MD Electronically Signed Final Report   10/20/2019 10:41 am ----------------------------------------------------------------------  ECHOCARDIOGRAM COMPLETE  Result Date: 10/22/2019    ECHOCARDIOGRAM REPORT   Patient Name:   [facility] [place] Date of Exam: 10/22/2019 Medical Rec #:  [id number]     Height:       60.0 in Accession #:    [id number]    Weight:       190.1 lb Date of Birth:  October 15, 1995     BSA:           1.827 m Patient Age:    23 years      BP:           150/98 mmHg Patient Gender: F             HR:           89 bpm. Exam Location:  Inpatient Procedure: 2D Echo, Cardiac Doppler and Color Doppler Indications:    I51.7 Cardiomegaly  History:        Patient has no prior history of Echocardiogram examinations.                 Risk Factors:Diabetes. CKD. Preeclampsia.  Sonographer:    Jonelle Sidle Dance Referring Phys: [place]  1. Left ventricular ejection fraction, by estimation, is 55 to 60%. The left ventricle has normal function. The left ventricle has no regional wall motion abnormalities. There is mild concentric left ventricular hypertrophy. Indeterminate diastolic filling due to E-A fusion.  2. Right ventricular systolic function is normal. The right ventricular size is normal. Tricuspid regurgitation signal is inadequate for assessing PA pressure.  3. Moderate pericardial effusion. The pericardial effusion is circumferential. There is no evidence of cardiac tamponade.  4. The mitral valve is grossly normal. No evidence of mitral valve regurgitation.  5. The aortic valve is tricuspid. Aortic valve regurgitation is not visualized. No aortic stenosis is present.  6. The inferior vena cava is normal in size with greater than 50% respiratory variability, suggesting right atrial pressure of 3 mmHg. FINDINGS  Left Ventricle: Left ventricular ejection fraction, by estimation, is 55 to 60%. The left ventricle has normal function. The left ventricle has no regional wall motion abnormalities. The left ventricular internal cavity size was normal in size. There is  mild concentric left ventricular hypertrophy. Indeterminate diastolic filling due to E-A fusion. Right Ventricle: The right ventricular size is normal. No increase in right ventricular wall thickness. Right ventricular systolic function is normal. Tricuspid  regurgitation signal is inadequate for assessing PA pressure. Left Atrium: Left atrial  size was normal in size. Right Atrium: Right atrial size was normal in size. Pericardium: A moderately sized pericardial effusion is present. The pericardial effusion is circumferential. There is no evidence of cardiac tamponade. Mitral Valve: The mitral valve is grossly normal. No evidence of mitral valve regurgitation. Tricuspid Valve: The tricuspid valve is grossly normal. Tricuspid valve regurgitation is trivial. Aortic Valve: The aortic valve is tricuspid. Aortic valve regurgitation is not visualized. No aortic stenosis is present. Pulmonic Valve: The pulmonic valve was grossly normal. Pulmonic valve regurgitation is trivial. Aorta: The aortic root and ascending aorta are structurally normal, with no evidence of dilitation. Venous: The inferior vena cava is normal in size with greater than 50% respiratory variability, suggesting right atrial pressure of 3 mmHg. IAS/Shunts: No atrial level shunt detected by color flow Doppler.  LEFT VENTRICLE PLAX 2D LVIDd:         4.90 cm  Diastology LVIDs:         3.30 cm  LV e' lateral:   10.70 cm/s LV PW:         1.10 cm  LV E/e' lateral: 10.2 LV IVS:        1.00 cm  LV e' medial:    5.91 cm/s LVOT diam:     1.90 cm  LV E/e' medial:  18.4 LV SV:         66 LV SV Index:   36 LVOT Area:     2.84 cm  RIGHT VENTRICLE             IVC RV Basal diam:  2.20 cm     IVC diam: 1.50 cm RV S prime:     13.70 cm/s TAPSE (M-mode): 2.1 cm LEFT ATRIUM             Index       RIGHT ATRIUM           Index LA diam:        3.60 cm 1.97 cm/m  RA Area:     13.30 cm LA Vol (A2C):   72.5 ml 39.69 ml/m RA Volume:   31.00 ml  16.97 ml/m LA Vol (A4C):   48.3 ml 26.44 ml/m LA Biplane Vol: 59.2 ml 32.41 ml/m  AORTIC VALVE LVOT Vmax:   125.00 cm/s LVOT Vmean:  79.150 cm/s LVOT VTI:    0.234 m  AORTA Ao Root diam: 3.10 cm Ao Asc diam:  2.70 cm MITRAL VALVE MV Area (PHT): 2.58 cm     SHUNTS MV Decel Time: 294 msec     Systemic VTI:  0.23 m MV E velocity: 109.00 cm/s  Systemic Diam: 1.90 cm Eleonore Chiquito MD Electronically signed by Eleonore Chiquito MD Signature Date/Time: 10/22/2019/6:34:46 PM    Final    ECHOCARDIOGRAM LIMITED  Result Date: 10/24/2019    ECHOCARDIOGRAM LIMITED REPORT   Patient Name:   [facility] Date of Exam: 10/24/2019 Medical Rec #:  [id number]     Height:       60.0 in Accession #:    [id number]    Weight:       190.1 lb Date of Birth:  06/16/1996     BSA:          1.827 m Patient Age:    23 years      BP:           146/89 mmHg Patient Gender: F  HR:           97 bpm. Exam Location:  Inpatient Procedure: Limited Echo, Color Doppler and Cardiac Doppler Indications:    I31.3 Pericardial effusion (noninflammatory)  History:        Patient has prior history of Echocardiogram examinations, most                 recent 10/22/2019. Risk Factors:Diabetes and Pre-ecclampsia. 2                 days post-partum.  Sonographer:    Raquel Sarna Senior RDCS Referring Phys: [id number] [place]  1. Left ventricular ejection fraction, by estimation, is 60 to 65%. The left ventricle has normal function. The left ventricle has no regional wall motion abnormalities.  2. Right ventricular systolic function is normal. The right ventricular size is normal. There is normal pulmonary artery systolic pressure.  3. No change in size of effusion sicne 10/22/19 There appears to be variation in mitral inflow with respiration but IVC collapses and is not very dilated suggesting no tamponade . Moderate pericardial effusion. The pericardial effusion is posterior to the left ventricle and posterior to the left ventricle and the left atrium.  4. The mitral valve is normal in structure and function. Trivial mitral valve regurgitation. No evidence of mitral stenosis.  5. The aortic valve is normal in structure and function. Aortic valve regurgitation is not visualized. No aortic stenosis is present.  6. The inferior vena cava is normal in size with greater than 50% respiratory variability, suggesting  right atrial pressure of 3 mmHg. FINDINGS  Left Ventricle: Left ventricular ejection fraction, by estimation, is 60 to 65%. The left ventricle has normal function. The left ventricle has no regional wall motion abnormalities. The left ventricular internal cavity size was normal in size. There is  no left ventricular hypertrophy. Right Ventricle: The right ventricular size is normal. No increase in right ventricular wall thickness. Right ventricular systolic function is normal. There is normal pulmonary artery systolic pressure. The tricuspid regurgitant velocity is 2.36 m/s, and  with an assumed right atrial pressure of 3 mmHg, the estimated right ventricular systolic pressure is 99991111 mmHg. Left Atrium: Left atrial size was normal in size. Right Atrium: Right atrial size was normal in size. Pericardium: No change in size of effusion sicne 10/22/19 There appears to be variation in mitral inflow with respiration but IVC collapses and is not very dilated suggesting no tamponade. A moderately sized pericardial effusion is present. The pericardial effusion is posterior to the left ventricle and posterior to the left ventricle and the left atrium. Mitral Valve: The mitral valve is normal in structure and function. Normal mobility of the mitral valve leaflets. Trivial mitral valve regurgitation. No evidence of mitral valve stenosis. Tricuspid Valve: The tricuspid valve is normal in structure. Tricuspid valve regurgitation is not demonstrated. No evidence of tricuspid stenosis. Aortic Valve: The aortic valve is normal in structure and function. Aortic valve regurgitation is not visualized. No aortic stenosis is present. Pulmonic Valve: The pulmonic valve was normal in structure. Pulmonic valve regurgitation is not visualized. No evidence of pulmonic stenosis. Aorta: The aortic root is normal in size and structure. Venous: The inferior vena cava is normal in size with greater than 50% respiratory variability, suggesting right  atrial pressure of 3 mmHg. IAS/Shunts: No atrial level shunt detected by color flow Doppler.  TRICUSPID VALVE TR Peak grad:   22.3 mmHg TR Vmax:  236.00 cm/s Jenkins Rouge MD Electronically signed by Jenkins Rouge MD Signature Date/Time: 10/24/2019/9:01:16 AM    Final    [place] MFM OB FOLLOW UP  Result Date: 10/16/2019 ----------------------------------------------------------------------  OBSTETRICS REPORT                       (Signed Final 10/16/2019 02:32 pm) ---------------------------------------------------------------------- Patient Info  ID #:       [id number]                          D.O.B.:  1996/06/05 (23 yrs)  Name:       [facility]                   Visit Date: 10/15/2019 08:44 pm ---------------------------------------------------------------------- Performed By  Performed By:     Berlinda Last          Ref. Address:      [address]  Attending:        Sander Nephew      Secondary Phy.:    [facility]                    MD                                                              Care  Referred By:      Estill Bamberg                 Location:          [facility]                    VARNADO MD                                [place] ---------------------------------------------------------------------- Orders   #  Description  Code         Ordered By   1  [place] MFM OB FOLLOW UP                  [id number]     [name]  ----------------------------------------------------------------------   #  Order #                    Accession #                 Episode #   1  [id number]                  [id number]                  [id number]  ---------------------------------------------------------------------- Indications   Pre-existing  diabetes, type 1, in pregnancy,   O24.013   third trimester   [redacted] weeks gestation of pregnancy                Z3A.29   Hypertension - Gestational                     O13.9  ---------------------------------------------------------------------- Fetal Evaluation  Num Of Fetuses:          1  Fetal Heart Rate(bpm):   135  Cardiac Activity:        Observed  Presentation:            Cephalic  Placenta:                Posterior  P. Cord Insertion:       Previously Visualized  Amniotic Fluid  AFI FV:      Subjectively increased  AFI Sum(cm)     %Tile       Largest Pocket(cm)  23.92           96          9.42  RUQ(cm)       RLQ(cm)       LUQ(cm)        LLQ(cm)  9.42          5.07          4.17           5.26 ---------------------------------------------------------------------- Biometry  BPD:      75.6  mm     G. Age:  30w 2d         58  %    CI:        74.32   %    70 - 86                                                          FL/HC:       19.2  %    19.2 - 21.4  HC:      278.4  mm     G. Age:  30w 3d         36  %    HC/AC:       1.07       0.99 - 1.21  AC:      260.5  mm     G. Age:  30w 1d         59  %    FL/BPD:  70.8  %    71 - 87  FL:       53.5  mm     G. Age:  28w 3d          8  %    FL/AC:       20.5  %    20 - 24  Est. FW:    1430   gm     3 lb 2 oz     34  % ---------------------------------------------------------------------- OB History  Gravidity:    2         Term:   0        Prem:   0        SAB:   1  TOP:          0       Ectopic:  0        Living: 0 ---------------------------------------------------------------------- Gestational Age  LMP:           30w 5d        Date:  03/14/19                 EDD:   12/19/19  U/S Today:     29w 6d                                        EDD:   12/25/19  Best:          29w 5d     Det. ByLoman Chroman         EDD:   12/26/19                                      (04/30/19) ---------------------------------------------------------------------- Anatomy  Cranium:                Appears normal         LVOT:                   Previously seen  Cavum:                 Previously seen        Aortic Arch:            Appears normal  Ventricles:            Previously seen        Ductal Arch:            Appears normal  Choroid Plexus:        Previously seen        Diaphragm:              Appears normal  Cerebellum:            Previously seen        Stomach:                Previously Seen  Posterior Fossa:       Previously seen        Abdomen:                Previously seen  Nuchal Fold:           Previously seen        Abdominal Wall:  Previously seen  Face:                  Orbits and profile     Cord Vessels:           Previously seen                         previously seen  Lips:                  Previously seen        Kidneys:                Appear normal  Palate:                Not well visualized    Bladder:                Appears normal  Thoracic:              Appears normal         Spine:                  Previously seen  Heart:                 Appears normal         Upper Extremities:      Previously seen                         (4CH, axis, and                         situs)  RVOT:                  Previously seen        Lower Extremities:      Previously seen  Other:  Female gender Heels and 5th digit visualized previously. Nasal bone          visualized previously. Technically difficult due to fetal position. ---------------------------------------------------------------------- Cervix Uterus Adnexa  Cervix  Not visualized (advanced GA >24wks) ---------------------------------------------------------------------- Impression  Normal interval growth  Inpatient management of new onset hypertension  Known CKD and Type 1 DM  Good fetal movement with subjectively increased amniotic  fluid volume but objectively normal. ---------------------------------------------------------------------- Recommendations  Continue weekly testing  Repeat growth in 3 weeks.  Consult performed  see notes for details. ----------------------------------------------------------------------               Sander Nephew, MD Electronically Signed Final Report   10/16/2019 02:32 pm ----------------------------------------------------------------------   MAU Course: Orders Placed This Encounter  Procedures  . Culture, blood (routine x 2)  . Urine culture  . SARS CORONAVIRUS 2 (TAT 6-24 HRS) Nasopharyngeal Nasopharyngeal Swab  . Urinalysis, Routine w reflex microscopic  . CBC  . Comprehensive metabolic panel  . Glucose, capillary  . Lactic acid, plasma  . Diet gestational carb mod Room service appropriate? Yes; Fluid consistency: Thin  . Notify physician (specify)  . Vital signs  . Initiate Oral Care Protocol  . Initiate Carrier Fluid Protocol  . SCDs  . Practitioner attestation of consent  . Give 8 oz juice or regular soda  . Full code  . Pregnancy, urine POC  . Type and screen [place]  . Admit to Inpatient (patient's expected length of stay will be greater than 2 midnights or inpatient only procedure)   Meds ordered  this encounter  Medications  . lactated ringers bolus 1,000 mL  . DISCONTD: dextrose 5% lactated ringers bolus 1,000 mL  . acetaminophen (TYLENOL) tablet 1,000 mg  . DISCONTD: lactated ringers bolus 1,000 mL  . DISCONTD: Ampicillin-Sulbactam (UNASYN) 3 g in sodium chloride 0.9 % 100 mL IVPB    Order Specific Question:   Antibiotic Indication:    Answer:   Other Indication (list below)    Order Specific Question:   Other Indication:    Answer:   endometritis  . lactated ringers bolus 1,000 mL  . Ampicillin-Sulbactam (UNASYN) 3 g in sodium chloride 0.9 % 100 mL IVPB    Order Specific Question:   Antibiotic Indication:    Answer:   Other Indication (list below)    Order Specific Question:   Other Indication:    Answer:   endometritis  . acetaminophen (TYLENOL) tablet 650 mg  . zolpidem (AMBIEN) tablet 5 mg  . docusate sodium (COLACE)  capsule 100 mg  . calcium carbonate (TUMS - dosed in mg elemental calcium) chewable tablet 400 mg of elemental calcium  . prenatal multivitamin tablet 1 tablet  . NIFEdipine (PROCARDIA-XL/NIFEDICAL-XL) 24 hr tablet 60 mg  . furosemide (LASIX) tablet 40 mg  . potassium chloride (KLOR-CON) CR tablet 10 mEq  . escitalopram (LEXAPRO) tablet 15 mg  . ferrous sulfate tablet 325 mg  . senna-docusate (Senokot-S) tablet 1 tablet  . insulin detemir (LEVEMIR) injection 10 Units  . insulin aspart (novoLOG) injection 3 Units  . Ampicillin-Sulbactam (UNASYN) 3 g in sodium chloride 0.9 % 100 mL IVPB    Order Specific Question:   Antibiotic Indication:    Answer:   Intra-abdominal Infection    Assessment/Plan: Ms.  Davanna Clukey is a 24 y.o. year old [id number] female at PPx 6 days who presents to MAU reporting "feeling cold x 1 hour" prior to arrival to MAU. She is getting admitted to antennal unit for LOS # 0. She was visiting her baby in the NICU and started not feeling well. She also started having N/V in MAU. She denies fever, but is shivering underneath 3 blankets. She has a h/o chronic kidney disease, Type 1 DM, hypokalemia, and PEC with SF. She had a PPH after delivery where she lost >1300 ml of blood, was taken to OR for U/S guided D&C and Bakri balloon placement. She also received a blood transfusion. She is an [place] patient. Pt admits to only eating a grapefruit today, endorse staking he morning levimir 10units in the morning, with 3units novolog TID today, BS was 57 in MAU and drank 8oz juice. Pt admits to taking her lexapro today for depression , mood stable, admits to taking her 40mg  lasix with 60meq of potassium today for cardiomegaly, denies cp, sob, denies URI s//sx or exposure to covid that she knows of. Pt s/p discharge from hospital x1 day ago on 10/27/2019 for Preeclampsia- nephrotic range proteinuria and HTN. 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  Fever:  T-Max temp 102, given 1000mg  tylenol, currently 100.6. BCX2 drawn, UC sent and pending, currently LLQ tenderness, otherwise asymptomatic, breast tender and firm could suspect mastitis, pt denies pumping frequently today. Admitted for suspect endometritis. Unasyn started 3gm Q6H and to continue. Covid test pending.   DMI with CKD stage 2: AKI/CKD stage 3- slight bump in Scr likely due to combination of preeclampsia as well as significant anemia; also had hypotension associated with epidural and lost blood with  delivery. Cr 1.61. GFR 45. CBG was 57 on admission given 8oz juice, repeat was 83 . Restarted on home insulin, levemir 10units in AM, with novolog 3units with meals, diabetic diet. Lactic acid 1.1. Will check fasting and 2 H PP CBG and TID prior to meals. .   Cardiomegaly/HTN: ANA negative, cardiology plan to follow out pt, + pericardial effusion without tamponade, continue on recommended 40mg  lasix pt on day 1 of 7 with 10 meq of potassium. Cardiology plan on repeating TEE in 2 weeks. BP currently  1384/84, continue on 60mg  XL procardia daily. Daily 81 ASA with SCD for VTE prophylactics.   Depression: Mood stable on lexapro 15mg  daily.   Anemia: Ferrous sulfate 150mg  daily with senna/doscusate. HGB on admission was 8.5, fatigue present otherwise asymptomatic.    DR Alesia Richards consulted and aware of POC. DR Alesia Richards agree to plan of care.   Noralyn Pick NP-C, CNM, MSN 10/28/2019, 10:28 PM

## 2019-10-28 NOTE — Telephone Encounter (Signed)
Patient contacted regarding discharge from  [facility] on 10/27/19 .  Patient understands to follow up with provider Gardiner Rhyme on 11/10/19 at 4 pm at [facility]. Patient understands discharge instructions? yes  Patient understands medications and regiment? yes  Patient understands to bring all medications to this visit? yes         Primary Cardiologist: Dr Gardiner Rhyme   Pt contacted.   Pt will f[facility] with [facility] provider as scheduled.  Pt. advised that we are restricting visitors at this time and request that only patients present for check-in prior to their appointment.  All other visitors should remain in their car.  If necessary, only one visitor may come with the patient, into the building. For everyone's safety, all patients and visitor entering our practice area should expect to be screened again prior to entering our waiting area.  Raiford Simmonds, RN  10/28/2019 9:22 AM

## 2019-10-28 NOTE — MAU Note (Signed)
Vaginal del 2/24, had a pp hemorrhage, received transfusion.  Feeling cold- started an hour ago- was upstairs visiting baby, having n/v- just started.   Denies fever.

## 2019-10-28 NOTE — MAU Note (Signed)
Pt called out reporting feeling "shaky and like blood sugar was low." CBG 57. Pt had juice at bedside and was instructed to drink some. IVF bolus started and labs drawn.

## 2019-10-28 NOTE — MAU Provider Note (Signed)
History     CSN: [id number]  Arrival date and time: 10/28/19 1759   First Provider Initiated Contact with Patient 10/28/19 2004      Chief Complaint  Patient presents with  . c/o feeling cold  . Emesis  . Nausea   HPI  [name] is a 24 y.o. year old [id number] female at PPx 6 days who presents to MAU reporting "feeling cold x 1 hour" prior to arrival to MAU. She was visiting her baby in the NICU and started not feeling well. She also started having N/V in MAU. She denies fever, but is shivering underneath 3 blankets. She has a h/o chronic kidney disease, Type 1 DM, hypokalemia, and PEC with SF. She had a PPH after delivery where she lost >1300 ml of blood, was taken to OR for U/S guided D&C and Bakri balloon placement. She also received a blood transfusion. She is an [facility] patient.  Past Medical History:  Diagnosis Date  . Anxiety   . Back spasm   . CKD (chronic kidney disease), stage III   . Diabetes mellitus without complication (HCC)    Type 1  . DKA (diabetic ketoacidoses) ([place])   . HSV infection    on valtrex  . Hypokalemia   . Leukocytosis   . Noncompliance with medication regimen   . Preeclampsia   . Severe anemia     Past Surgical History:  Procedure Laterality Date  . DILATION AND EVACUATION N/A 10/22/2019   Procedure: ULTRASOUND GUIDED DILATATION AND EVACUATION;  Surgeon: [name], MD;  Location: [facility]S;  Service: Gynecology;  Laterality: N/A;  . NO PAST SURGERIES      Family History  Adopted: Yes  Family history unknown: Yes    Social History   Tobacco Use  . Smoking status: Former Research scientist (life sciences)  . Smokeless tobacco: Never Used  Substance Use Topics  . Alcohol use: No  . Drug use: No    Allergies:  Allergies  Allergen Reactions  . Cantaloupe Extract Allergy Skin Test Itching    Mouth itching    . Nsaids     Avoid per nephrology    Medications Prior to Admission  Medication Sig Dispense Refill Last Dose  . acetaminophen  (TYLENOL) 500 MG tablet Take 500 mg by mouth every 6 (six) hours as needed for mild pain.   10/28/2019 at Unknown time  . escitalopram (LEXAPRO) 5 MG tablet Take 3 tablets (15 mg total) by mouth daily. 120 tablet 2 10/28/2019 at Unknown time  . furosemide (LASIX) 40 MG tablet Take 1 tablet (40 mg total) by mouth daily. 7 tablet 0 10/28/2019 at Unknown time  . insulin aspart (NOVOLOG) 100 UNIT/ML injection Inject 3 Units into the skin daily with lunch. 10 mL 11 10/28/2019 at Unknown time  . insulin aspart (NOVOLOG) 100 UNIT/ML injection Inject 3 Units into the skin daily with supper. 10 mL 11 10/28/2019 at Unknown time  . insulin aspart (NOVOLOG) 100 UNIT/ML injection Inject 2 Units into the skin daily with breakfast. 10 mL 11 10/28/2019 at Unknown time  . insulin detemir (LEVEMIR) 100 UNIT/ML injection Inject 0.1 mLs (10 Units total) into the skin daily. 10 mL 11 10/28/2019 at Unknown time  . Insulin Syringe-Needle U-100 (INSULIN SYRINGE .3CC/31GX5/16") 31G X 5/16" 0.3 ML MISC Use as directed 100 each 11 10/28/2019 at Unknown time  . NIFEdipine (ADALAT CC) 60 MG 24 hr tablet Take 1 tablet (60 mg total) by mouth daily. 30 tablet 2  10/28/2019 at Unknown time  . ondansetron (ZOFRAN) 4 MG tablet Take 1 tablet (4 mg total) by mouth every 8 (eight) hours as needed for nausea or vomiting. 30 tablet 2 Past Week at Unknown time  . potassium chloride (KLOR-CON) 10 MEQ tablet Take 1 tablet (10 mEq total) by mouth daily. 7 tablet 0 10/28/2019 at Unknown time  . Prenatal Vit-Fe Fumarate-FA (PRENATAL MULTIVITAMIN) TABS tablet Take 1 tablet by mouth at bedtime.   10/28/2019 at Unknown time  . acetaminophen (TYLENOL) 325 MG tablet Take 2 tablets (650 mg total) by mouth every 4 (four) hours as needed (for pain scale < 4). 30 tablet 1   . oxyCODONE (OXY IR/ROXICODONE) 5 MG immediate release tablet Take 1 tablet (5 mg total) by mouth every 4 (four) hours as needed (pain scale 4-7). 4 tablet 0 not taking    Review of Systems   Constitutional: Positive for appetite change and chills ("feeling cold").  HENT: Negative.   Eyes: Negative.   Respiratory: Negative.   Cardiovascular: Negative.   Gastrointestinal: Positive for nausea and vomiting.  Endocrine: Negative.   Genitourinary: Negative.   Musculoskeletal: Negative.   Skin: Negative.   Allergic/Immunologic: Negative.   Neurological: Negative.   Hematological: Negative.   Psychiatric/Behavioral: Negative.    Physical Exam   Blood pressure 133/74, pulse (!) 109, temperature (!) 101.4 F (38.6 C), temperature source Oral, resp. rate 20, currently breastfeeding.  Physical Exam  Constitutional: She is oriented to person, place, and time. She appears lethargic. She has a sickly appearance.  Cardiovascular: Normal rate.  Respiratory: Effort normal and breath sounds normal.  GI: Soft. There is abdominal tenderness.  Neurological: She is oriented to person, place, and time. She appears lethargic.  Skin: There is pallor.  Skin hot to touch  Psychiatric: She has a normal mood and affect. Her behavior is normal. Judgment and thought content normal.    MAU Course  Procedures  MDM LR bolus CBC Tylenol 1000 mg -- increasing temp  *Consult with Dr. Nehemiah Settle @ 2020 - notified of patient's complaints, assessments, & lab results, recommended tx plan call on-call MD to recommend PP admission, Dr. Nehemiah Settle ordering Lactic Acid, IV Unasyn, and blood cultures. Dr. Alesia Richards @ 2030 - notified of patient's complaints, assessments, lab results, & Dr. Glenna Durand recommended tx plan, and temp of now 102.7 30 mins after Tylenol. Dr. Alesia Richards agrees with plan for readmission and orders for COVID-19 testing. Dr. Alesia Richards will notify [facility], CNM to come get patient admitted.  Assessment and Plan  Postpartum Fever - Suspected endometritis  - Unasyn by IV - Blood cultures - Lactic Acid - COVID-19 Rapid test - Noralyn Pick, CNM assumes care of patient at 2100   Laury Deep,  MSN, CNM 10/28/2019, 8:05 PM

## 2019-10-29 LAB — GLUCOSE, CAPILLARY
Glucose-Capillary: 103 mg/dL — ABNORMAL HIGH (ref 70–99)
Glucose-Capillary: 165 mg/dL — ABNORMAL HIGH (ref 70–99)
Glucose-Capillary: 52 mg/dL — ABNORMAL LOW (ref 70–99)
Glucose-Capillary: 63 mg/dL — ABNORMAL LOW (ref 70–99)
Glucose-Capillary: 66 mg/dL — ABNORMAL LOW (ref 70–99)
Glucose-Capillary: 81 mg/dL (ref 70–99)

## 2019-10-29 LAB — URINE CULTURE: Culture: <10000 — AB

## 2019-10-29 LAB — SARS CORONAVIRUS 2 (TAT 6-24 HRS): SARS Coronavirus 2: NEGATIVE

## 2019-10-29 LAB — TYPE AND SCREEN
ABO/RH(D): O NEG
Antibody Screen: POSITIVE

## 2019-10-29 MED ORDER — INSULIN ASPART 100 UNIT/ML ~~LOC~~ SOLN
0.0000 [IU] | SUBCUTANEOUS | Status: DC
  Administered 2019-10-30 (×2): 1 [IU] via SUBCUTANEOUS

## 2019-10-29 MED ORDER — INSULIN DETEMIR 100 UNIT/ML ~~LOC~~ SOLN
10.0000 [IU] | Freq: Every day | SUBCUTANEOUS | Status: DC
  Filled 2019-10-29: qty 0.1

## 2019-10-29 NOTE — Progress Notes (Signed)
I spoke with Dr. Nancy Fetter, Opthalmology regarding pt's scotomata and blurry vision.  She suspects it may be due to fluid shifts with Preeclampsia.  She can see her in clinic this Friday, 10/31/19.  She will have her front desk call the pt tomorrow to schedule the appointment.

## 2019-10-29 NOTE — Lactation Note (Signed)
Lactation Consultation Note  Patient Name: Kathy Lewis [id number] Date: 10/29/2019     [facility] in to see P49 Mom of 75 day old infant in the NICU.  Mom readmitted to [facility] after feeling shaky, cold while visiting her infant in the NICU. Mom noted to have a 102 fever in MAU.  Mom is a Type 1 DM.  BS was 57 in MAU.    Mom has a DEBP set up at bedside.  Mom on antibiotics for suspected uterine infection.    Mom has been pumping regularly and has a great milk supply, expressing 4-5 oz per pumping session.  Mom has storage bottles and breast milk labels for EBM.  Mom had just finished pumping on the regular setting.  Mom states her right breast is painful to the touch.  On exam, noted some mild erythema around in the upper and outer aspect of the areola.  Both breasts warm to touch, but right breast is painful over reddened area.  Mom states she has been pumping regularly.    Warm compresses given to Mom.  RN informed.  Plan to treat suspected right breast infection- 1- warm compresses to breast, gentle massage 2- frequent double pumping (8-12 times per 24 hrs) 3- increase fluids, rest  Mom knows to call prn for concerns.    [name] 10/29/2019, 8:50 AM

## 2019-10-29 NOTE — Progress Notes (Addendum)
Readmission Day #1 S/p SVD #8 S/p D&E #8  Subjective: Pt reports she is feeling better this am.  No longer cold.  Pt reports right breast pain, gets 1 oz less out of that breast.  Minimal lochia, no lower abdominal pain but did have LLQ last night.  Denies headaches, SOB, coughing.  Vision is still blurry.  Pt reports she wears glasses but has lost them.  She reports they only help with the blurry vision a little.   Temp:  [99.3 F (37.4 C)-102.7 F (39.3 C)] 99.6 F (37.6 C) (03/03 0515) Pulse Rate:  [99-111] 99 (03/03 0515) Resp:  [18-20] 18 (03/03 0515) BP: (125-138)/(74-84) 138/79 (03/03 0515) SpO2:  [99 %] 99 % (03/03 0515) Weight:  [82.2 kg] 82.2 kg (03/03 0524)    Intake/Output Summary (Last 24 hours) at 10/29/2019 0901 Last data filed at 10/29/2019 0400 Gross per 24 hour  Intake 100 ml  Output --  Net 100 ml    Gen:  NAD, A&O x 3, speaks in complete sentencesCV:  RRR, no murmers Breasts:  Full bilaterally, right breast slightly erythematous around the areola Lungs: Breathing normally, unlabored. Abd:  Fundus firm and Nontender, below umbilicus, soft, nondistended Ext: 1+ LE edema bilaterally, significantly improved from discharge.  CBG (last 3)  Recent Labs    10/28/19 1928 10/28/19 2257 10/29/19 0821  GLUCAP 57* 83 66*   CBC Latest Ref Rng & Units 10/28/2019 10/27/2019 10/25/2019  WBC 4.0 - 10.5 K/uL 15.8(H) 10.1 15.9(H)  Hemoglobin 12.0 - 15.0 g/dL 8.5(L) 8.8(L) 10.6(L)  Hematocrit 36.0 - 46.0 % 25.0(L) 26.2(L) 31.3(L)  Platelets 150 - 400 K/uL 218 222 219    CMP Latest Ref Rng & Units 10/28/2019 10/27/2019 10/26/2019  Glucose 70 - 99 mg/dL 54(L) 171(H) 200(H)  BUN 6 - 20 mg/dL 15 13 15   Creatinine 0.44 - 1.00 mg/dL 1.61(H) 1.52(H) 1.55(H)  Sodium 135 - 145 mmol/L 139 140 136  Potassium 3.5 - 5.1 mmol/L 4.5 4.6 4.4  Chloride 98 - 111 mmol/L 106 107 104  CO2 22 - 32 mmol/L 24 27 25   Calcium 8.9 - 10.3 mg/dL 7.5(L) 7.6(L) 7.3(L)  Total Protein 6.5 - 8.1 g/dL 4.6(L)  - 4.2(L)  Total Bilirubin 0.3 - 1.2 mg/dL 0.6 - 0.6  Alkaline Phos 38 - 126 U/L 68 - 66  AST 15 - 41 U/L 20 - 23  ALT 0 - 44 U/L 14 - 16    A/P 23yo [id number] readmitted for fever:  Fever on Unasyn.  Likely Mastitis due to exam findings and pt's history.  D/w Lactation consultant.  Please see her   Note.  Treating for endometritis but pt denies uterine tenderness or foul odor.  Mild LLQ pain last night has   resolved this am.  F/u blood cultures.    -Type I DM- Hypoglycemia  Previously controlled with 10 units Levimir and Novolog AC 2/3/3 meals.  D/w Diabetic coordinator. She recommended decrease to 5 units of Levamir due to lows but pt has   already gotten 10 units this am.    Will discontinue all Novalog AC meals and will check check CBGs  q 4 hours and do meal   correction.   -AKI/Stage 3 kidney disease  Continue Lasix 40 mg once daily with Kdur 10 meq once daily.  -H/o Preeclampsia with severe features  BP controlled on Procardia XL 30 mg  Still with blurry vision.  D/w opthamology.   -Heme- anemia, PPH, kidney disease  Iron daily   -  Cardio-pericardial effusion   Asymptomatic.  Continue Lasix.   -Psych  Continue Lexapro 15 mg daily.  Pt is very thankful that she has come through the delivery.    Consult spiritual services for trauma experienced around delivery.  -Postpartum care  Tylenol and oxy for pain management, plan to avoid NSAIDs    Discharge planning:  Anticipate 24 hours of antibiotics if pt remains afebrile and discharge with po  antibiotics.   Meridee Score, MD [facility]  [phone]- cell [phone]- office

## 2019-10-29 NOTE — Progress Notes (Addendum)
Inpatient Diabetes Program Recommendations  AACE/ADA: New Consensus Statement on Inpatient Glycemic Control (2015)  Target Ranges:  Prepandial:   less than 140 mg/dL      Peak postprandial:   less than 180 mg/dL (1-2 hours)      Critically ill patients:  140 - 180 mg/dL   Lab Results  Component Value Date   GLUCAP 66 (L) 10/29/2019   HGBA1C 12.1 (H) 12/18/2014    Review of Glycemic Control Results for DARASIMI, WALP (MRN [id number]) as of 10/29/2019 08:46  Ref. Range 10/28/2019 19:28 10/28/2019 22:57 10/29/2019 08:21  Glucose-Capillary Latest Ref Range: 70 - 99 mg/dL 57 (L) 83 66 (L)   Diabetes history: Type 1 DM Outpatient Diabetes medications: Novolog 2/3/3 units TID, Levemir 10 units QD Current orders for Inpatient glycemic control: Levemir 10 units QD, Novolog 3 units TID  Inpatient Diabetes Program Recommendations:   Noted hypoglycemia this AM and on presentation. Question if related to underlying infection. Patient at risk for further lows with current regimen. Attempted to reach out to RN to encourage holding medication until MD was notified and orders updated.  At this recommending:  -Novolog 0-6 units Q4H and discontinue Novolog 3 units TID. -Decrease Levemir to 5 units and reschedule for QHS Secure chat sent to MD.   Addendum @0942 : Discussed with Dr Simona Huh. Noted patient received Levemir this AM. Anticipate possibility for continued hypoglycemia. Would recommend discontinuing meal coverage and change correction to Novolog 0-6 units q4H. Anticipate possible need to decrease Levemir dose at discharge. RN notified and encouraged to keep snacks in the room for patient and when going to NICU.   Thanks, Bronson Curb, MSN, RNC-OB Diabetes Coordinator [phone] (8a-5p)

## 2019-10-30 LAB — GLUCOSE, CAPILLARY
Glucose-Capillary: 149 mg/dL — ABNORMAL HIGH (ref 70–99)
Glucose-Capillary: 152 mg/dL — ABNORMAL HIGH (ref 70–99)

## 2019-10-30 MED ORDER — INSULIN DETEMIR 100 UNIT/ML ~~LOC~~ SOLN
8.0000 [IU] | Freq: Every day | SUBCUTANEOUS | Status: DC
  Administered 2019-10-30: 8 [IU] via SUBCUTANEOUS
  Filled 2019-10-30: qty 0.08

## 2019-10-30 MED ORDER — INSULIN DETEMIR 100 UNIT/ML ~~LOC~~ SOLN: 8.0000 [IU] | Freq: Every day | SUBCUTANEOUS | 11 refills | Status: DC

## 2019-10-30 MED ORDER — AMOXICILLIN-POT CLAVULANATE 875-125 MG PO TABS: 1.0000 | ORAL_TABLET | Freq: Two times a day (BID) | ORAL | 0 refills | Status: AC

## 2019-10-30 MED ORDER — INSULIN ASPART 100 UNIT/ML ~~LOC~~ SOLN: 2.0000 [IU] | Freq: Three times a day (TID) | SUBCUTANEOUS | Status: DC

## 2019-10-30 MED ORDER — INSULIN ASPART 100 UNIT/ML ~~LOC~~ SOLN: 2.0000 [IU] | Freq: Three times a day (TID) | SUBCUTANEOUS | 11 refills | Status: DC

## 2019-10-30 MED ORDER — INSULIN ASPART 100 UNIT/ML ~~LOC~~ SOLN: 0.0000 [IU] | Freq: Three times a day (TID) | SUBCUTANEOUS | Status: DC

## 2019-10-30 MED FILL — AMOX-CLAV 875-125 MG TABLET: 875-125 | 7 days supply | Qty: 14 | Fill #0

## 2019-10-30 NOTE — Progress Notes (Addendum)
Inpatient Diabetes Program Recommendations  AACE/ADA: New Consensus Statement on Inpatient Glycemic Control (2015)  Target Ranges:  Prepandial:   less than 140 mg/dL      Peak postprandial:   less than 180 mg/dL (1-2 hours)      Critically ill patients:  140 - 180 mg/dL   Lab Results  Component Value Date   GLUCAP 152 (H) 10/30/2019   HGBA1C 12.1 (H) 12/18/2014    Review of Glycemic Control Results for Kathy Lewis, Kathy Lewis (MRN [id number]) as of 10/30/2019 08:46  Ref. Range 10/29/2019 20:00 10/29/2019 23:49 10/30/2019 03:57 10/30/2019 08:18  Glucose-Capillary Latest Ref Range: 70 - 99 mg/dL 63 (L) 165 (H) 149 (H) 152 (H)   Diabetes history: Type 1 DM Outpatient Diabetes medications: Novolog 2/3/3 units TID, Levemir 10 units QD Current orders for Inpatient glycemic control:Levemir 10 units QD, Novolog 0-6 Q4H  Inpatient Diabetes Program Recommendations:  Noted hypoglycemia yesterday of 52 mg/dL following dose of Levemir, along with subsequent elevated fastings. Question if dose lasted long enough and suspect poor oral intake.  Consider: -Novolog 0-6 units TID -Novolog 2 units TID (assuming patient is consuming >50% of meal) -Decrease Levemir to 8 units QD  Discussed with Dr Landry Mellow, orders received. Reached out to RN and encouraged to give am correction and anticipate insulin changes.   Thanks, Bronson Curb, MSN, RNC-OB Diabetes Coordinator [phone] (8a-5p)

## 2019-10-30 NOTE — Discharge Instructions (Signed)
Breastfeeding and Human Lactation (4th ed., pp. 262-299). [place], [place]: [employer].">   Breastfeeding and Mastitis    Mastitis is inflammation of the breast tissue. It can occur in women who are breastfeeding. This can make breastfeeding painful. Mastitis will sometimes go away on its own, especially if it is not caused by an infection (non-infectious mastitis). Your health care provider will help determine if medical treatment is needed. Treatment may be needed if the condition is caused by a bacterial infection (infectious mastitis).  What are the causes?  This condition is often associated with a blocked milkduct, which can happen when too much milk builds up in the breast. Causes of excess milk in the breast can include:  Poor latch-on. If your baby is not latched onto the breast properly, he or she may not empty your breast completely while breastfeeding.  Allowing too much time to pass between feedings.  Wearing a bra or other clothing that is too tight. This puts extra pressure on the milk ducts so milk does not flow through them as it should.  Milk remaining in the breast because it is overfilled (engorged).  Stress and fatigue.  Mastitis can also be caused by a bacterial infection. Bacteria may enter the breast tissue through cuts, cracks, or openings in the skin near the nipple area. Cracks in the skin are often caused when your baby does not latch on properly to the breast.  What are the signs or symptoms?  Symptoms of this condition include:  Swelling, redness, tenderness, and pain in an area of the breast. This usually affects the upper part of the breast, toward the armpit region. In most cases, it affects only one breast. In some cases, it may occur on both breasts at the same time and affect a larger portion of breast tissue.  Swelling of the glands under the arm on the same side.  Fatigue, headache, and flu-like muscle aches.  Fever.  Rapid pulse.  Symptoms usually last 2 to 5  days. Breast pain and redness are at their worst on day 2 and day 3, and they usually go away by day 5. If an infection is left to progress, a collection of pus (abscess) may develop.  How is this diagnosed?  This condition can be diagnosed based on your symptoms and a physical exam. You may also have tests, such as:  Blood tests to determine if your body is fighting a bacterial infection.  Mammogram or ultrasound tests to rule out other problems or diseases.  Fluid tests. If an abscess has developed, the fluid in the abscess may be removed with a needle. The fluid may be analyzed to determine if bacteria are present.  Breast milk may be cultured and tested for bacteria.  How is this treated?  This condition will sometimes go away on its own. Your health care provider may choose to wait 24 hours after first seeing you to decide whether treatment is needed. If treatment is needed, it may include:  Strategies to manage breastfeeding. This includes continuing to breastfeed or pump in order to allow adequate milk flow, using breast massage, and applying heat or cold to the affected area.  Self-care such as rest and increased fluid intake.  Medicine for pain.  Antibiotic medicine to treat a bacterial infection. This is usually taken by mouth.  If an abscess has developed, it may be treated by removing fluid with a needle.  Follow these instructions at home:  Medicines  Take   were prescribed an antibiotic medicine, take it as told by your health care provider. Do not stop taking the antibiotic even if you start to feel better. General instructions  Do not wear a tight or underwire bra. Wear a soft, supportive bra.  Increase your fluid intake, especially if you have a fever.  Get plenty of rest. For breastfeeding:  Continue to empty your breasts as often as possible, either by  breastfeeding or using an electric breast pump. This will lower the pressure and the pain that comes with it. Ask your health care provider if changes need to be made to your breastfeeding or pumping routine.  Keep your nipples clean and dry.  During breastfeeding, empty the first breast completely before going to the other breast. If your baby is not emptying your breasts completely, use a breast pump to empty your breasts.  Use breast massage during feeding or pumping sessions.  If directed, apply moist heat to the affected area of your breast right before breastfeeding or pumping. Use the heat source that your health care provider recommends.  If directed, put ice on the affected area of your breast right after breastfeeding or pumping: ? Put ice in a plastic bag. ? Place a towel between your skin and the bag. ? Leave the ice on for 20 minutes.  If you go back to work, pump your breasts while at work to stay in time with your nursing schedule.  Do not allow your breasts to become engorged. Contact a health care provider if:  You have pus-like discharge from the breast.  You have a fever.  Your symptoms do not improve within 2 days of starting treatment.  Your symptoms return after you have recovered from a breast infection. Get help right away if:  Your pain and swelling are getting worse.  You have pain that is not controlled with medicine.  You have a red line extending from the breast toward your armpit. Summary  Mastitis is inflammation of the breast tissue. It is often caused by a blocked milk duct or bacteria.  This condition may be treated with hot and cold compresses, medicines, self-care, and certain breastfeeding strategies.  If you were prescribed an antibiotic medicine, take it as told by your health care provider. Do not stop taking the antibiotic even if you start to feel better.  Continue to empty your breasts as often as possible either by breastfeeding or  using an electric breast pump. This information is not intended to replace advice given to you by your health care provider. Make sure you discuss any questions you have with your health care provider. Document Revised: 05/03/2018 Document Reviewed: 08/15/2016 Elsevier Patient Education  [place] [employer].      Endometritis  Endometritis is irritation, soreness, or inflammation that affects the lining of the uterus (endometrium). Infection is usually the cause of endometritis. It is important to get treatment to prevent complications. Common complications may include more severe infections and not being able to have children(infertility). What are the causes? This condition may be caused by:  Bacterial infections.  STIs (sexually transmitted infections).  A miscarriage or childbirth, especially after a long labor or cesarean delivery.  Certain gynecological procedures. These may include dilation and curettage (D&C), hysteroscopy, or birth control (contraceptive) insertion.  Tuberculosis (TB). What are the signs or symptoms? Symptoms of this condition include:  Fever.  Lower abdomen (abdominal) pain.  Pelvis (pelvic) pain.  Abnormal vaginal discharge or bleeding.  Abdominal bloating (distention) or  swelling.  General discomfort or generally feeling ill.  Discomfort with bowel movements.  Constipation. How is this diagnosed? This condition may be diagnosed based on:  A physical exam, including a pelvic exam.  Tests, such as: ? Blood tests. ? Removal of a sample of endometrial tissue for testing (endometrial biopsy). ? Examining a sample of vaginal discharge under a microscope (wet prep). ? Removal of a sample of fluid from the cervix for testing (cervical culture). ? Surgical examination of the pelvis and abdomen. How is this treated? This condition is treated with:  Antibiotic medicines.  For more severe cases, hospitalization may be needed to give fluids  and antibiotics directly into a vein through an IV tube. Follow these instructions at home:  Take over-the-counter and prescription medicines only as told by your health care provider.  Drink enough fluid to keep your urine clear or pale yellow.  Take your antibiotic medicine as told by your health care provider. Do not stop taking the antibiotic even if you start to feel better.  Do not douche or have sex (including vaginal, oral, and anal sex) until your health care provider approves.  If your endometritis was caused by an STI, do not have sex (including vaginal, oral, and anal sex) until your partner has also been treated for the STI.  Return to your normal activities as told by your health care provider. Ask your health care provider what activities are safe for you.  Keep all follow-up visits as told by your health care provider. This is important. Contact a health care provider if:  You have pain that does not get better with medicine.  You have a fever.  You have pain with bowel movements. Get help right away if:  You have abdominal swelling.  You have abdominal pain that gets worse.  You have bad-smelling vaginal discharge, or an increased amount of vaginal discharge.  You have abnormal vaginal bleeding.  You have nausea and vomiting. Summary  Endometritis affects the lining of the uterus (endometrium) and is usually caused by an infection.  It is important to get treatment to prevent complications.  You have several treatment options for endometritis. Treatment may include antibiotics and IV fluids.  Take your antibiotic medicine as told by your health care provider. Do not stop taking the antibiotic even if you start to feel better.  Do not douche or have sex (including vaginal, oral, and anal sex) until your health care provider approves. This information is not intended to replace advice given to you by your health care provider. Make sure you discuss any  questions you have with your health care provider. Document Revised: 07/27/2017 Document Reviewed: 08/29/2016 Elsevier Patient Education  2020 [employer].

## 2019-10-30 NOTE — Plan of Care (Signed)
Pt to be discharged with printed instructions. No concerns noted. Toya Smothers, RN

## 2019-10-30 NOTE — Progress Notes (Signed)
HD #2 for management of Mastitis   S: Per patient pain in breast has improved. She is pumping every 2 hours. No fever for over 24 hours.  Vitals:   10/30/19 0359 10/30/19 0811 10/30/19 1216 10/30/19 1457  BP: 120/82 112/69 122/80 133/81  Pulse: 97 93 97 99  Resp: 18 18 18 18   Temp: 98.6 F (37 C) 98.9 F (37.2 C) 98.6 F (37 C) 98.3 F (36.8 C)  TempSrc: Oral Oral Oral Oral  SpO2: 100% 98% 100% 99%  Weight:      Height:        General Alert and Oriented NAD Breast no erythema noted. Slightly tender to palpation around areola of right breast.  Abdomen Soft slightly tender to palpation  Ext no Edema    A/P  1) Mastitis pt afebrile for over 24 hours ... plan discharge on augmentin 875 bid for 7 days.  2) Insulin Dependent Diabetes.. due to hypoglycemia Novolog decreased to 8 units daily .Marland Kitchen  3) Preeclampsia - pt  To continue procardia XL 30 mg daily  4) Blurred Vision _ follow up wit opthamologist outpatient

## 2019-10-30 NOTE — Lactation Note (Signed)
Lactation Consultation Note  Patient Name: Kathy Lewis [id number] Date: 10/30/2019  Follow up attempted but mom is sleeping.   Maternal Data    Feeding    LATCH Score                   Interventions    Lactation Tools Discussed/Used     Consult Status      Ave Filter 10/30/2019, 11:03 AM

## 2019-10-30 NOTE — Lactation Note (Signed)
Lactation Consultation Note  Patient Name: Kathy Lewis [id number] Date: 10/30/2019  Mom states pain in right breast is better.  She is pumping every 3 hours.  No questions or concerns.  Encouraged to call for concerns prn.   Maternal Data    Feeding    LATCH Score                   Interventions    Lactation Tools Discussed/Used     Consult Status      Ave Filter 10/30/2019, 1:49 PM

## 2019-11-02 LAB — CULTURE, BLOOD (ROUTINE X 2)
Culture: NO GROWTH
Culture: NO GROWTH
Special Requests: ADEQUATE
Special Requests: ADEQUATE

## 2019-11-07 ENCOUNTER — Other Ambulatory Visit (HOSPITAL_COMMUNITY): Payer: Managed Care, Other (non HMO)

## 2019-11-10 ENCOUNTER — Ambulatory Visit: Payer: Managed Care, Other (non HMO) | Admitting: Cardiology

## 2019-11-10 NOTE — Discharge Summary (Signed)
OB Discharge Summary     Patient Name: Kathy Lewis DOB: 1996-01-08 MRN: [id number]  Date of admission: 10/15/2019 Delivering MD: [name]   Date of discharge: 11/10/2019  Admitting diagnosis: Hypertension affecting pregnancy, third trimester [O16.3], Contractions, Type I DM, CKD Stage 3 Intrauterine pregnancy: [redacted]w[redacted]d     Secondary diagnosis:  Active Problems:   Preeclampsia  Additional problems: IUP @ 62 5/7 weeks     Discharge diagnosis: Preterm Pregnancy Delivered, Preeclampsia (severe), Anemia and Type 1 DM, pericardial effusion, visual changes, s/p SVD.                                                                                             Post partum procedures:D&E, Blood transfusion,  Cardiac echo,  Augmentation: Pitocin and Cytotec  Complications: [id number]   Consults:  MFM, Nephrology, Cardiology, Diabetic Coordinator  Hospital course:  Pt was admitted due to contractions and Gest HTN vs. Preeclampsia.   Preeclampsia with severe features: Pt did not start Procardia as prescribed upon discharge. Upon arrival, Procardia XL 30 mg was increased to 60 mg and then 90 mg.. Pt had red floaties then black scotoma throughout her admission.  Cr: increased to ~ 1.9.  24 hour urine protein was ~4000 which was less than the first trimester.  Other labs were normal.  Pt developed blurry vision the day before delivery but it was thought it was due to Ambien and Atarax.  Her vision improved slightly over the day but it was still present the next morning.  Pt was induced at that time and Magnesium sulfate was started. Postpartum:  BP was managed with Procardial XL 60 mg and Lasix IV.  Type I DM: Pt was given BMZ x 2 doses, which would elevate her blood sugar.  Pt started on her home dosage of NPH 20 BID, Novolog AC meals. Pt had previous inpatient hypoglycemic episodes but due to anticipated hyperglycemia secondary BMZ, it was continued.  Throughout her hospital course  and after delivery, Insulin was titrated down.  Pt had several hypoglycemic episodes at times due to poor intake.  Pt was started on Levemir postpartum and Novolog AC meals with improved control.  Pt was seen daily by the diabetic coordinator.  AKI, with CKD Stage 3: Nephrology followed pt antepartum due to concerns of renal injury secondary to Preeclampsia.  Recommendations given for HTN management peripartum.  Due to volume overload, Lasix IV was given postpartum and pt was discharge on po Lasix.  Pt's creatinine improved after admission.  Pt had severe anemia due to renal disease and postpartum hemorrhage. Pt would receive 5 units PRBCs, 1 unit FFP.  Coags, DIC panel were normal.  Pericardial effusion: Intrapartum, crackles were heard on exam.  Chest Xray did not show pleural effusions but showed  Cardiomegaly.  Postpartum cardiac echo revealed normal EF up to 60% but a moderate pericardial effusion, which did not decrease much in less than 24 hours.  Aggressive diuresis was recommended.  Blurry vison: It improved slightly s/p delivery but it did not return to baseline.  Outpatien Opthalmalogy consult would be set up.  Induction of Labor With Vaginal Delivery   24 y.o. yo [id number] at [redacted]w[redacted]d was admitted to the hospital 10/15/2019 for induction of labor.  Indication for induction: Preeclampsia.  Patient had an uncomplicated labor course as follows: Membrane Rupture Time/Date: 7:51 AM ,10/22/2019   Intrapartum Procedures: Episiotomy: None [1]                                         Lacerations:  None [1]  Patient had delivery of a Viable infant.  Information for the patient's newborn:  Brandis, Wixted [[id number]]     Pt had [place] and was taken for D&E.  See operative note for details.  10/22/2019  Details of delivery can be found in separate delivery note.  Patient had a routine postpartum course. Patient is discharged home 11/10/19.  Physical exam  Vitals:   10/27/19 0517 10/27/19  0807 10/27/19 1118 10/27/19 1615  BP: 137/84 128/79 (!) 136/92 (!) 136/93  Pulse: 97 97 94 (!) 109  Resp: 18 17 18 17   Temp: 98.6 F (37 C) 98.4 F (36.9 C) 98.2 F (36.8 C) 98 F (36.7 C)  TempSrc: Oral Oral Oral Oral  SpO2: 98% 99% 99% 100%  Weight:      Height:       General: alert, cooperative and no distress Lochia: appropriate Uterine Fundus: firm Incision: N/A DVT Evaluation: No evidence of DVT seen on physical exam. Calf/Ankle edema is present Labs: Lab Results  Component Value Date   WBC 15.8 (H) 10/28/2019   HGB 8.5 (L) 10/28/2019   HCT 25.0 (L) 10/28/2019   MCV 92.3 10/28/2019   PLT 218 10/28/2019   CMP Latest Ref Rng & Units 10/28/2019  Glucose 70 - 99 mg/dL 54(L)  BUN 6 - 20 mg/dL 15  Creatinine 0.44 - 1.00 mg/dL 1.61(H)  Sodium 135 - 145 mmol/L 139  Potassium 3.5 - 5.1 mmol/L 4.5  Chloride 98 - 111 mmol/L 106  CO2 22 - 32 mmol/L 24  Calcium 8.9 - 10.3 mg/dL 7.5(L)  Total Protein 6.5 - 8.1 g/dL 4.6(L)  Total Bilirubin 0.3 - 1.2 mg/dL 0.6  Alkaline Phos 38 - 126 U/L 68  AST 15 - 41 U/L 20  ALT 0 - 44 U/L 14    Discharge instruction: per After Visit Summary and "Baby and Me Booklet".  After visit meds:  Allergies as of 10/27/2019      Reactions   Cantaloupe Extract Allergy Skin Test Itching   Mouth itching   Nsaids    Avoid per nephrology      Medication List    STOP taking these medications   aspirin EC 81 MG tablet   butalbital-acetaminophen-caffeine 50-325-40 MG tablet Commonly known as: FIORICET   insulin aspart 100 UNIT/ML injection Commonly known as: novoLOG   insulin NPH Human 100 UNIT/ML injection Commonly known as: NOVOLIN N   promethazine 12.5 MG tablet Commonly known as: PHENERGAN   sertraline 25 MG tablet Commonly known as: ZOLOFT     TAKE these medications   acetaminophen 500 MG tablet Commonly known as: TYLENOL Take 500 mg by mouth every 6 (six) hours as needed for mild pain. What changed: Another medication with  the same name was added. Make sure you understand how and when to take each.   acetaminophen 325 MG tablet Commonly known as: Tylenol Take 2 tablets (650 mg total) by mouth every 4 (  four) hours as needed (for pain scale < 4). What changed: You were already taking a medication with the same name, and this prescription was added. Make sure you understand how and when to take each.   escitalopram 5 MG tablet Commonly known as: LEXAPRO Take 3 tablets (15 mg total) by mouth daily. What changed:   medication strength  how much to take  when to take this   furosemide 40 MG tablet Commonly known as: LASIX Take 1 tablet (40 mg total) by mouth daily.   INSULIN SYRINGE .3CC/31GX5/16" 31G X 5/16" 0.3 ML Misc Use as directed   NIFEdipine 60 MG 24 hr tablet Commonly known as: ADALAT CC Take 1 tablet (60 mg total) by mouth daily.   ondansetron 4 MG tablet Commonly known as: ZOFRAN Take 1 tablet (4 mg total) by mouth every 8 (eight) hours as needed for nausea or vomiting.   oxyCODONE 5 MG immediate release tablet Commonly known as: Oxy IR/ROXICODONE Take 1 tablet (5 mg total) by mouth every 4 (four) hours as needed (pain scale 4-7).   potassium chloride 10 MEQ tablet Commonly known as: KLOR-CON Take 1 tablet (10 mEq total) by mouth daily.   prenatal multivitamin Tabs tablet Take 1 tablet by mouth at bedtime.       Diet: carb modified diet  Activity: Advance as tolerated. Pelvic rest for 6 weeks.   Outpatient follow up:4-5 days Follow up Appt: Future Appointments  Date Time Provider [place]  11/19/2019 10:35 AM [id number]  11/19/2019  2:40 PM Donato Heinz, MD [facility] [place]   Follow up Visit: 5 days  Postpartum contraception: IUD Unsure of which type  Newborn Data: Live born female  Birth Weight: 3 lb 7.4 oz (1570 g) APGAR: 7, 8  Newborn Delivery   Birth date/time: 10/22/2019 11:44:00 Delivery type: Vaginal,  Spontaneous      Baby Feeding: Breast Disposition:NICU, in stable condition, on room air at time of discharge.   11/10/2019 [name], MD

## 2019-11-17 NOTE — Progress Notes (Deleted)
Cardiology Office Note:    Date:  11/17/2019   ID:  Kathy Lewis, DOB 1996/04/11, MRN [id number]  PCP:  [facility], [facility]  Cardiologist:  No primary care provider on file.  Electrophysiologist:  None   Referring MD: Medicine, [place]*   No chief complaint on file. ***  History of Present Illness:    Kathy Lewis is a 24 y.o. female with a hx of type 1 diabetes, CKD stage III, who presents for follow-up evaluation of pericardial effusion.  She was admitted last month to [facility] with preeclampsia, and underwent delivery at 30 weeks.  Chest x-ray was notable for marked cardiomegaly, prompting echocardiogram.  Echo 10/22/19 showed EF 55-60%, mild LVH, indeterminate diastolic filling due to E-A fusion, normal RV, moderate pericardial effusion without evidence for tamponade.  Repeat TTE 10/24/2019 showed no change in effusion.  Plan was for repeat limited echo in 2 weeks, which was scheduled for 11/15/2019.  Past Medical History:  Diagnosis Date  . Anxiety   . Back spasm   . CKD (chronic kidney disease), stage III   . Diabetes mellitus without complication (HCC)    Type 1  . DKA (diabetic ketoacidoses) ([place])   . HSV infection    on valtrex  . Hypokalemia   . Leukocytosis   . Noncompliance with medication regimen   . Preeclampsia   . Severe anemia     Past Surgical History:  Procedure Laterality Date  . DILATION AND EVACUATION N/A 10/22/2019   Procedure: ULTRASOUND GUIDED DILATATION AND EVACUATION;  Surgeon: [name], MD;  Location: [facility] LD [facility];  Service: Gynecology;  Laterality: N/A;  . NO PAST SURGERIES      Current Medications: No outpatient medications have been marked as taking for the 11/19/19 encounter (Appointment) with Donato Heinz, MD.     Allergies:   Cantaloupe extract allergy skin test and Nsaids   Social History   Socioeconomic History  . Marital status: Single    Spouse name: Not on file  . Number of children: Not on  file  . Years of education: Not on file  . Highest education level: Not on file  Occupational History  . Not on file  Tobacco Use  . Smoking status: Former Research scientist (life sciences)  . Smokeless tobacco: Never Used  Substance and Sexual Activity  . Alcohol use: No  . Drug use: No  . Sexual activity: Yes    Birth control/protection: None  Other Topics Concern  . Not on file  Social History Narrative  . Not on file   Social Determinants of Health   Financial Resource Strain:   . Difficulty of Paying Living Expenses:   Food Insecurity:   . Worried About Charity fundraiser in the Last Year:   . Arboriculturist in the Last Year:   Transportation Needs:   . Film/video editor (Medical):   Marland Kitchen Lack of Transportation (Non-Medical):   Physical Activity:   . Days of Exercise per Week:   . Minutes of Exercise per Session:   Stress:   . Feeling of Stress :   Social Connections:   . Frequency of Communication with Friends and Family:   . Frequency of Social Gatherings with Friends and Family:   . Attends Religious Services:   . Active Member of Clubs or Organizations:   . Attends Archivist Meetings:   Marland Kitchen Marital Status:      Family History: The patient's ***She was adopted. Family history is  unknown by patient.  ROS:   Please see the history of present illness.    *** All other systems reviewed and are negative.  EKGs/Labs/Other Studies Reviewed:    The following studies were reviewed today: ***  EKG:  EKG is *** ordered today.  The ekg ordered today demonstrates ***  TTE 10/22/2019: 1. Left ventricular ejection fraction, by estimation, is 55 to 60%. The  left ventricle has normal function. The left ventricle has no regional  wall motion abnormalities. There is mild concentric left ventricular  hypertrophy. Indeterminate diastolic  filling due to E-A fusion.  2. Right ventricular systolic function is normal. The right ventricular  size is normal. Tricuspid regurgitation  signal is inadequate for assessing  PA pressure.  3. Moderate pericardial effusion. The pericardial effusion is  circumferential. There is no evidence of cardiac tamponade.  4. The mitral valve is grossly normal. No evidence of mitral valve  regurgitation.  5. The aortic valve is tricuspid. Aortic valve regurgitation is not  visualized. No aortic stenosis is present.  6. The inferior vena cava is normal in size with greater than 50%  respiratory variability, suggesting right atrial pressure of 3 mmHg.   TTE 10/24/19:  1. Left ventricular ejection fraction, by estimation, is 60 to 65%. The  left ventricle has normal function. The left ventricle has no regional  wall motion abnormalities.  2. Right ventricular systolic function is normal. The right ventricular  size is normal. There is normal pulmonary artery systolic pressure.  3. No change in size of effusion sicne 10/22/19 There appears to be  variation in mitral inflow with respiration but IVC collapses and is not  very dilated suggesting no tamponade . Moderate pericardial effusion. The  pericardial effusion is posterior to  the left ventricle and posterior to the left ventricle and the left  atrium.  4. The mitral valve is normal in structure and function. Trivial mitral  valve regurgitation. No evidence of mitral stenosis.  5. The aortic valve is normal in structure and function. Aortic valve  regurgitation is not visualized. No aortic stenosis is present.  6. The inferior vena cava is normal in size with greater than 50%  respiratory variability, suggesting right atrial pressure of 3 mmHg.   Recent Labs: 10/23/2019: TSH 1.756 10/24/2019: Magnesium 3.9 10/28/2019: ALT 14; BUN 15; Creatinine, Ser 1.61; Hemoglobin 8.5; Platelets 218; Potassium 4.5; Sodium 139  Recent Lipid Panel No results found for: CHOL, TRIG, HDL, CHOLHDL, VLDL, LDLCALC, LDLDIRECT  Physical Exam:    VS:  There were no vitals taken for this visit.     Wt Readings from Last 3 Encounters:  10/29/19 181 lb 4 oz (82.2 kg)  10/21/19 190 lb 2 oz (86.2 kg)  10/13/19 176 lb (79.8 kg)     GEN: *** Well nourished, well developed in no acute distress HEENT: Normal NECK: No JVD; No carotid bruits LYMPHATICS: No lymphadenopathy CARDIAC: ***RRR, no murmurs, rubs, gallops RESPIRATORY:  Clear to auscultation without rales, wheezing or rhonchi  ABDOMEN: Soft, non-tender, non-distended MUSCULOSKELETAL:  No edema; No deformity  SKIN: Warm and dry NEUROLOGIC:  Alert and oriented x 3 PSYCHIATRIC:  Normal affect   ASSESSMENT:    No diagnosis found. PLAN:    In order of problems listed above:  Moderate pericardial effusion: Suspect secondary to preeclampsia.  Had nephrotic range proteinuria. -Repeat TTE 11/19/2019 for monitoring  AKI on CKD stage III: Follows with nephrology.  On p.o. Lasix  RTC in***  Medication Adjustments/Labs and Tests  Ordered: Current medicines are reviewed at length with the patient today.  Concerns regarding medicines are outlined above.  No orders of the defined types were placed in this encounter.  No orders of the defined types were placed in this encounter.   There are no Patient Instructions on file for this visit.   Signed, Donato Heinz, MD  11/17/2019 2:46 PM    [place]

## 2019-11-19 ENCOUNTER — Other Ambulatory Visit (HOSPITAL_COMMUNITY): Payer: Managed Care, Other (non HMO)

## 2019-11-19 ENCOUNTER — Ambulatory Visit: Payer: Managed Care, Other (non HMO) | Admitting: Cardiology

## 2019-12-01 ENCOUNTER — Ambulatory Visit: Payer: Self-pay

## 2019-12-01 NOTE — Lactation Note (Signed)
This note was copied from a baby's chart. Lactation Consultation Note  Patient Name: Kathy Lewis [id number] Date: 12/01/2019 Reason for consult: Follow-up assessment;NICU baby  Called to NICU to assist with 33 week old baby AGA [redacted]w[redacted]d.  Baby being gavage fed with Mom attempting periodically at the breast.  RN called wondering if a nipple shield would help with baby latching.   Mom has a GREAT milk supply, pumping 6 oz per session.  Breasts full, but not engorged.  Milk easily expressed by hand.  Mom's nipples are short shafted, but areola compressible.    Mom had baby in cradle hold.  Offered to assist and positioned hands to cross cradle hold.  Pillow added over "boppy pillow" to support baby at breast height better.  Baby opens her mouth to latch, but quickly slips onto nipple.  Assisted Mom to sandwich breast, but baby unable to sustain a deep latch.    Initiated a 20 mm nipple shield, showing Mom how to properly apply shield.  Nipple pulled 1/2 way into shield.  Baby brought to breast, but suck disorganized and baby unable to attain a rhythmic suck pattern to transfer milk from breast.  Baby sounded congested, RN came in and suctioned baby's nose.  Baby was able to have a few on and off suckles at the breast, causing nipple to pull further into shield, and Mom's milk noted in shield tip.  Unable to identify swallows at the breast even though milk drops noted in baby's mouth.  Reassured Mom and encouraged her to place baby STS on her chest when baby became fussy.  NG feeding started to settle baby down.    Recommended offering breast when baby cues.  Mom aware of importance of a deep latch to breast.  Nipple shield may be needed temporarily to help baby feel the stimulus to suck.  Encouraged Mom to try often when able.   Appt made for 12/02/19 at 2pm for another assist.   Feeding Feeding Type: Breast Fed  LATCH Score Latch: Repeated attempts needed to sustain latch, nipple held in mouth  throughout feeding, stimulation needed to elicit sucking reflex.  Audible Swallowing: None  Type of Nipple: Everted at rest and after stimulation(short nipple shaft)  Comfort (Breast/Nipple): Soft / non-tender  Hold (Positioning): Assistance needed to correctly position infant at breast and maintain latch.  LATCH Score: 6  Interventions Interventions: Breast feeding basics reviewed;Assisted with latch;Skin to skin;Breast massage;Hand express;Breast compression;Adjust position;Support pillows;Position options;Expressed milk;DEBP  Lactation Tools Discussed/Used Tools: Nipple Shields;Pump;Flanges Nipple shield size: 20 Flange Size: 24 Breast pump type: Double-Electric Breast Pump   Consult Status Consult Status: Follow-up Date: 12/02/19 Follow-up type: In-patient    [name] 12/01/2019, 6:03 PM

## 2019-12-02 ENCOUNTER — Ambulatory Visit: Payer: Self-pay

## 2019-12-02 ENCOUNTER — Encounter: Payer: Self-pay | Admitting: Cardiology

## 2019-12-02 NOTE — Lactation Note (Signed)
This note was copied from a baby's chart. Lactation Consultation Note  Patient Name: Kathy Lewis [id number] Date: 12/02/2019 Reason for consult: Follow-up assessment;NICU baby  Prescheduled LC appt for 1400 , mom present  Changing the baby's diaper - large wet and stool.  Baby awake fussy, and when mom placed baby STS to  Breast baby latched sluggishly on to the NS #20 and  \suckled a few times / even with mom stimulating baby did not seem  Interested in feeding. NICU RN started the tube feeding while the  Latch attempt was in progress .  Per mom will try later to latch.    Maternal Data Has patient been taught Hand Expression?: Yes  Feeding Feeding Type: Breast Fed  LATCH Score Latch: Repeated attempts needed to sustain latch, nipple held in mouth throughout feeding, stimulation needed to elicit sucking reflex.  Audible Swallowing: None  Type of Nipple: Everted at rest and after stimulation(with NS)  Comfort (Breast/Nipple): Filling, red/small blisters or bruises, mild/mod discomfort  Hold (Positioning): Assistance needed to correctly position infant at breast and maintain latch.([place] flipped upper lip and easied chin)  LATCH Score: 5  Interventions Interventions: Breast feeding basics reviewed;Skin to skin;Adjust position;Breast compression;Support pillows;Position options  Lactation Tools Discussed/Used Tools: Nipple Shields Nipple shield size: 20   Consult Status Consult Status: PRN Date: (baby in NICU) Follow-up type: In-patient    [place] 12/02/2019, 2:20 PM

## 2020-01-06 NOTE — Discharge Summary (Signed)
Physician Discharge Summary  Patient ID: Kathy Lewis MRN: [id number] DOB/AGE: 1995/09/24 24 y.o.  Admit date: 10/28/2019 Discharge date: 01/06/2020  Admission Diagnoses: Fever, postpartum endometritis. S/p SVD, type I DM, CKD, pericardial effusion  Discharge Diagnoses:  Active Problems:   Fever Mastitis  Discharged Condition: Improved  Hospital Course: Pt remained afebrile, pain controlled.    Consults:  Lactation consult=suspected mastitis  Significant Diagnostic Studies: None  Treatments: antibiotics: Unasyn  Discharge Exam: Blood pressure 133/81, pulse 99, temperature 98.3 F (36.8 C), temperature source Oral, resp. rate 18, height 5' (1.524 m), weight 82.2 kg, SpO2 99 %, currently breastfeeding.  See note Day of discharge.  Disposition: Discharge disposition: 01-Home or Self Care       Discharge Instructions     Activity as tolerated   Complete by: As directed    Call MD for:  persistant nausea and vomiting   Complete by: As directed    Call MD for:  severe uncontrolled pain   Complete by: As directed    Call MD for:  temperature >100.4   Complete by: As directed    Diet Carb Modified   Complete by: As directed    Sexual activity   Complete by: As directed    Avoid sex for 6 weeks      Allergies as of 10/30/2019       Reactions   Cantaloupe Extract Allergy Skin Test Itching   Mouth itching   Nsaids    Avoid per nephrology        Medication List     TAKE these medications    acetaminophen 500 MG tablet Commonly known as: TYLENOL Take 500 mg by mouth every 6 (six) hours as needed for mild pain.   acetaminophen 325 MG tablet Commonly known as: Tylenol Take 2 tablets (650 mg total) by mouth every 4 (four) hours as needed (for pain scale < 4).   escitalopram 5 MG tablet Commonly known as: LEXAPRO Take 3 tablets (15 mg total) by mouth daily.   furosemide 40 MG tablet Commonly known as: LASIX Take 1 tablet (40 mg total) by mouth daily.    insulin aspart 100 UNIT/ML injection Commonly known as: novoLOG Inject 2 Units into the skin 3 (three) times daily with meals. What changed:   how much to take  when to take this  Another medication with the same name was removed. Continue taking this medication, and follow the directions you see here.   insulin detemir 100 UNIT/ML injection Commonly known as: LEVEMIR Inject 0.08 mLs (8 Units total) into the skin daily. What changed: how much to take   INSULIN SYRINGE .3CC/31GX5/16" 31G X 5/16" 0.3 ML Misc Use as directed   NIFEdipine 60 MG 24 hr tablet Commonly known as: ADALAT CC Take 1 tablet (60 mg total) by mouth daily.   ondansetron 4 MG tablet Commonly known as: ZOFRAN Take 1 tablet (4 mg total) by mouth every 8 (eight) hours as needed for nausea or vomiting.   oxyCODONE 5 MG immediate release tablet Commonly known as: Oxy IR/ROXICODONE Take 1 tablet (5 mg total) by mouth every 4 (four) hours as needed (pain scale 4-7).   potassium chloride 10 MEQ tablet Commonly known as: KLOR-CON Take 1 tablet (10 mEq total) by mouth daily.   prenatal multivitamin Tabs tablet Take 1 tablet by mouth at bedtime.       ASK your doctor about these medications    amoxicillin-clavulanate 875-125 MG tablet Commonly known as: Augmentin Take 1  tablet by mouth 2 (two) times daily for 14 doses. Ask about: Should I take this medication?       Follow-up Information     [name], MD. Schedule an appointment as soon as possible for a visit in 1 week(s).   Specialty: Obstetrics and Gynecology Contact information: [address] [place] [address] [phone]         Corey Harold, MD. Go on 10/31/2019.   Why: Opthalmology.  Call office today if you have not been scheduled for an appointment. Contact information: [place] [address] [phone]            Signed: [name] 01/06/2020, 12:31 PM

## 2020-05-18 DIAGNOSIS — E785 Hyperlipidemia, unspecified: Secondary | ICD-10-CM | POA: Insufficient documentation

## 2020-06-14 ENCOUNTER — Other Ambulatory Visit: Payer: Self-pay

## 2020-06-14 ENCOUNTER — Encounter (HOSPITAL_BASED_OUTPATIENT_CLINIC_OR_DEPARTMENT_OTHER): Payer: Self-pay | Admitting: Emergency Medicine

## 2020-06-14 ENCOUNTER — Emergency Department (HOSPITAL_BASED_OUTPATIENT_CLINIC_OR_DEPARTMENT_OTHER)
Admission: EM | Admit: 2020-06-14 | Discharge: 2020-06-14 | Disposition: A | Discharge status: Home / Self Care | Payer: Managed Care, Other (non HMO) | Attending: Emergency Medicine | Admitting: Emergency Medicine

## 2020-06-14 ENCOUNTER — Emergency Department (HOSPITAL_BASED_OUTPATIENT_CLINIC_OR_DEPARTMENT_OTHER): Payer: Managed Care, Other (non HMO)

## 2020-06-14 DIAGNOSIS — N183 Chronic kidney disease, stage 3 unspecified: Secondary | ICD-10-CM | POA: Diagnosis not present

## 2020-06-14 DIAGNOSIS — Z794 Long term (current) use of insulin: Secondary | ICD-10-CM | POA: Insufficient documentation

## 2020-06-14 DIAGNOSIS — E1110 Type 2 diabetes mellitus with ketoacidosis without coma: Secondary | ICD-10-CM | POA: Insufficient documentation

## 2020-06-14 DIAGNOSIS — Z87891 Personal history of nicotine dependence: Secondary | ICD-10-CM | POA: Insufficient documentation

## 2020-06-14 DIAGNOSIS — I129 Hypertensive chronic kidney disease with stage 1 through stage 4 chronic kidney disease, or unspecified chronic kidney disease: Secondary | ICD-10-CM | POA: Diagnosis not present

## 2020-06-14 DIAGNOSIS — Z79899 Other long term (current) drug therapy: Secondary | ICD-10-CM | POA: Insufficient documentation

## 2020-06-14 DIAGNOSIS — I509 Heart failure, unspecified: Secondary | ICD-10-CM

## 2020-06-14 DIAGNOSIS — Z20822 Contact with and (suspected) exposure to covid-19: Secondary | ICD-10-CM | POA: Insufficient documentation

## 2020-06-14 DIAGNOSIS — R0602 Shortness of breath: Secondary | ICD-10-CM | POA: Diagnosis present

## 2020-06-14 LAB — URINALYSIS, ROUTINE W REFLEX MICROSCOPIC
Bilirubin Urine: NEGATIVE
Glucose, UA: >=500 mg/dL — AB
Ketones, ur: NEGATIVE mg/dL
Leukocytes,Ua: NEGATIVE
Nitrite: NEGATIVE
Protein, ur: 100 mg/dL — AB
Specific Gravity, Urine: 1.015 (ref 1.005–1.030)
pH: 6.5 (ref 5.0–8.0)

## 2020-06-14 LAB — COMPREHENSIVE METABOLIC PANEL
ALT: 16 U/L (ref 0–44)
AST: 18 U/L (ref 15–41)
Albumin: 2.4 g/dL — ABNORMAL LOW (ref 3.5–5.0)
Alkaline Phosphatase: 74 U/L (ref 38–126)
Anion gap: 6 (ref 5–15)
BUN: 34 mg/dL — ABNORMAL HIGH (ref 6–20)
CO2: 25 mmol/L (ref 22–32)
Calcium: 7.7 mg/dL — ABNORMAL LOW (ref 8.9–10.3)
Chloride: 101 mmol/L (ref 98–111)
Creatinine, Ser: 2.26 mg/dL — ABNORMAL HIGH (ref 0.44–1.00)
GFR, Estimated: 29 mL/min — ABNORMAL LOW (ref >60)
Glucose, Bld: 459 mg/dL — ABNORMAL HIGH (ref 70–99)
Potassium: 4.8 mmol/L (ref 3.5–5.1)
Sodium: 132 mmol/L — ABNORMAL LOW (ref 135–145)
Total Bilirubin: 0.5 mg/dL (ref 0.3–1.2)
Total Protein: 5.5 g/dL — ABNORMAL LOW (ref 6.5–8.1)

## 2020-06-14 LAB — CBC WITH DIFFERENTIAL/PLATELET
Abs Immature Granulocytes: 0.01 10*3/uL (ref 0.00–0.07)
Basophils Absolute: 0.1 10*3/uL (ref 0.0–0.1)
Basophils Relative: 1 %
Eosinophils Absolute: 0.1 10*3/uL (ref 0.0–0.5)
Eosinophils Relative: 2 %
HCT: 22.3 % — ABNORMAL LOW (ref 36.0–46.0)
Hemoglobin: 7.5 g/dL — ABNORMAL LOW (ref 12.0–15.0)
Immature Granulocytes: 0 %
Lymphocytes Relative: 34 %
Lymphs Abs: 1.7 10*3/uL (ref 0.7–4.0)
MCH: 32.5 pg (ref 26.0–34.0)
MCHC: 33.6 g/dL (ref 30.0–36.0)
MCV: 96.5 fL (ref 80.0–100.0)
Monocytes Absolute: 0.2 10*3/uL (ref 0.1–1.0)
Monocytes Relative: 4 %
Neutro Abs: 3.1 10*3/uL (ref 1.7–7.7)
Neutrophils Relative %: 59 %
Platelets: 178 10*3/uL (ref 150–400)
RBC: 2.31 MIL/uL — ABNORMAL LOW (ref 3.87–5.11)
RDW: 14.1 % (ref 11.5–15.5)
WBC: 5.2 10*3/uL (ref 4.0–10.5)
nRBC: 0 % (ref 0.0–0.2)

## 2020-06-14 LAB — RESPIRATORY PANEL BY RT PCR (FLU A&B, COVID)
Influenza A by PCR: NEGATIVE
Influenza B by PCR: NEGATIVE
SARS Coronavirus 2 by RT PCR: NEGATIVE

## 2020-06-14 LAB — URINALYSIS, MICROSCOPIC (REFLEX)

## 2020-06-14 LAB — CBG MONITORING, ED: Glucose-Capillary: 431 mg/dL — ABNORMAL HIGH (ref 70–99)

## 2020-06-14 LAB — PREGNANCY, URINE: Preg Test, Ur: NEGATIVE

## 2020-06-14 LAB — BRAIN NATRIURETIC PEPTIDE: B Natriuretic Peptide: 962.5 pg/mL — ABNORMAL HIGH (ref 0.0–100.0)

## 2020-06-14 MED ORDER — FUROSEMIDE 10 MG/ML IJ SOLN
80.0000 mg | Freq: Once | INTRAMUSCULAR | Status: AC
  Administered 2020-06-14: 80 mg via INTRAVENOUS
  Filled 2020-06-14: qty 8

## 2020-06-14 MED ORDER — INSULIN ASPART 100 UNIT/ML ~~LOC~~ SOLN
10.0000 [IU] | Freq: Once | SUBCUTANEOUS | Status: AC
  Administered 2020-06-14: 10 [IU] via SUBCUTANEOUS
  Filled 2020-06-14: qty 10

## 2020-06-14 NOTE — Discharge Instructions (Addendum)
Take double dose of Lasix for the next 3 days.  Please follow-up with your cardiologist and to make sure that you schedule your outpatient echo that they have already ordered.

## 2020-06-14 NOTE — ED Triage Notes (Signed)
Reports having SOB since yesterday.  Hx of CHF.  Reports taking her diuretics but has not been peeing as much.

## 2020-06-14 NOTE — ED Provider Notes (Signed)
[facility] EMERGENCY DEPARTMENT Provider Note   CSN: [id number] Arrival date & time: 06/14/20  0022     History Chief Complaint  Patient presents with  . Shortness of Breath    Kathy Lewis is a 24 y.o. female.  Patient presents to the emergency department for evaluation of shortness of breath.  Patient reports that she has noticed increased shortness of breath, dyspnea on exertion and increased difficulty breathing when she lies flat.  Patient reports a history of congestive heart failure.  She has doubled her Lasix dose for the last 2 days but has not seen any increased urination or relief of her shortness of breath.  Patient reports that when her congestive heart failure exacerbations occur she mostly feels shortness of breath but she has noticed some leg swelling this time as well.        Past Medical History:  Diagnosis Date  . Anxiety   . Back spasm   . CKD (chronic kidney disease), stage III ([place])   . Diabetes mellitus without complication (HCC)    Type 1  . DKA (diabetic ketoacidoses)   . HSV infection    on valtrex  . Hypokalemia   . Leukocytosis   . Noncompliance with medication regimen   . Preeclampsia   . Severe anemia     Patient Active Problem List   Diagnosis Date Noted  . Fever 10/28/2019  . Preeclampsia 10/15/2019  . Severe anemia 10/13/2019  . Pre-existing type 1 diabetes mellitus with hyperglycemia during pregnancy in first trimester ([place]) 04/30/2019  . Hypokalemia   . DKA (diabetic ketoacidoses) 12/18/2014  . Leukocytosis 12/18/2014  . Noncompliance with medications 12/18/2014  . Type 1 diabetes mellitus with ketoacidosis, uncontrolled ([place]) 12/18/2014  . Nausea and vomiting 12/18/2014    Past Surgical History:  Procedure Laterality Date  . DILATION AND EVACUATION N/A 10/22/2019   Procedure: ULTRASOUND GUIDED DILATATION AND EVACUATION;  Surgeon: [name], MD;  Location: [facility] LD ORS;  Service: Gynecology;  Laterality: N/A;    . NO PAST SURGERIES       OB History    Gravida  2   Para  1   Term      Preterm  1   AB  1   Living  1     SAB  1   TAB      Ectopic      Multiple  0   Live Births  1           Family History  Adopted: Yes  Family history unknown: Yes    Social History   Tobacco Use  . Smoking status: Former Research scientist (life sciences)  . Smokeless tobacco: Never Used  Vaping Use  . Vaping Use: Never used  Substance Use Topics  . Alcohol use: No  . Drug use: No    Home Medications Prior to Admission medications   Medication Sig Start Date End Date Taking? Authorizing Provider  albuterol (VENTOLIN HFA) 108 (90 Base) MCG/ACT inhaler Inhale into the lungs. 01/15/20 01/14/21 Yes [provider]  carvedilol (COREG) 3.125 MG tablet Take by mouth. 04/13/20  Yes [provider]  furosemide (LASIX) 20 MG tablet Take by mouth. 04/20/20  Yes [provider]  insulin aspart (NOVOLOG) 100 UNIT/ML injection Inject into the skin. 10/30/19  Yes [provider]  insulin detemir (LEVEMIR) 100 UNIT/ML injection Inject into the skin. 10/31/19  Yes [provider]  lisinopril (ZESTRIL) 10 MG tablet Take by mouth. 02/23/20  Yes  [provider]  spironolactone (ALDACTONE) 25 MG tablet Take by mouth. 04/20/20  Yes [provider]  Vitamin D, Ergocalciferol, (DRISDOL) 1.25 MG (50000 UNIT) CAPS capsule Take 50,000 Units by mouth every 7 (seven) days.   Yes [provider]  acetaminophen (TYLENOL) 325 MG tablet Take 2 tablets (650 mg total) by mouth every 4 (four) hours as needed (for pain scale < 4). 10/27/19   [name], MD  acetaminophen (TYLENOL) 500 MG tablet Take 500 mg by mouth every 6 (six) hours as needed for mild pain.    [provider]  albuterol (VENTOLIN HFA) 108 (90 Base) MCG/ACT inhaler Inhale into the lungs. 01/15/20   [provider]  butalbital-acetaminophen-caffeine (FIORICET) 50-325-40 MG tablet Take by mouth.  02/15/20   [provider]  escitalopram (LEXAPRO) 10 MG tablet Take 10 mg by mouth daily. 05/10/20   [provider]  escitalopram (LEXAPRO) 5 MG tablet Take 3 tablets (15 mg total) by mouth daily. Patient not taking: Reported on 10/30/2019 10/28/19   [name], MD  furosemide (LASIX) 40 MG tablet Take 1 tablet (40 mg total) by mouth daily. 10/28/19   [name], MD  insulin aspart (NOVOLOG) 100 UNIT/ML injection Inject 2 Units into the skin 3 (three) times daily with meals. 10/30/19   Christophe Louis, MD  insulin detemir (LEVEMIR) 100 UNIT/ML injection Inject 0.08 mLs (8 Units total) into the skin daily. 10/31/19   Christophe Louis, MD  Insulin Syringe-Needle U-100 (INSULIN SYRINGE .3CC/31GX5/16") 31G X 5/16" 0.3 ML MISC Use as directed 10/27/19   [name], MD  NIFEdipine (ADALAT CC) 60 MG 24 hr tablet Take 1 tablet (60 mg total) by mouth daily. 10/28/19   [name], MD  ondansetron (ZOFRAN) 4 MG tablet Take 1 tablet (4 mg total) by mouth every 8 (eight) hours as needed for nausea or vomiting. Patient not taking: Reported on 10/30/2019 06/17/19   Tamala Julian, [place], CNM  oxyCODONE (OXY IR/ROXICODONE) 5 MG immediate release tablet Take 1 tablet (5 mg total) by mouth every 4 (four) hours as needed (pain scale 4-7). Patient not taking: Reported on 10/30/2019 10/27/19   [name], MD  potassium chloride (KLOR-CON) 10 MEQ tablet Take 1 tablet (10 mEq total) by mouth daily. 10/27/19   [name], MD  Prenatal Vit-Fe Fumarate-FA (PRENATAL MULTIVITAMIN) TABS tablet Take 1 tablet by mouth at bedtime.    [provider]    Allergies    Cantaloupe extract allergy skin test and Nsaids  Review of Systems   Review of Systems  Respiratory: Positive for shortness of breath.   Cardiovascular: Positive for leg swelling.  All other systems reviewed and are negative.   Physical Exam Updated Vital Signs BP (!) 145/102   Pulse 83   Temp 98.1 F (36.7 C) (Oral)   Resp 18   Ht 5'  (1.524 m)   Wt 65.3 kg   SpO2 97%   BMI 28.12 kg/m   Physical Exam Vitals and nursing note reviewed.  Constitutional:      General: She is not in acute distress.    Appearance: Normal appearance. She is well-developed.  HENT:     Head: Normocephalic and atraumatic.     Right Ear: Hearing normal.     Left Ear: Hearing normal.     Nose: Nose normal.  Eyes:     Conjunctiva/sclera: Conjunctivae normal.     Pupils: Pupils are equal, round, and reactive to light.  Cardiovascular:     Rate and  Rhythm: Regular rhythm.     Heart sounds: S1 normal and S2 normal. No murmur heard.  No friction rub. No gallop.   Pulmonary:     Effort: Pulmonary effort is normal. No respiratory distress.     Breath sounds: Normal breath sounds.  Chest:     Chest wall: No tenderness.  Abdominal:     General: Bowel sounds are normal.     Palpations: Abdomen is soft.     Tenderness: There is no abdominal tenderness. There is no guarding or rebound. Negative signs include Murphy's sign and McBurney's sign.     Hernia: No hernia is present.  Musculoskeletal:        General: Normal range of motion.     Cervical back: Normal range of motion and neck supple.     Right lower leg: Edema (trace) present.     Left lower leg: Edema (trace) present.  Skin:    General: Skin is warm and dry.     Findings: No rash.  Neurological:     Mental Status: She is alert and oriented to person, place, and time.     GCS: GCS eye subscore is 4. GCS verbal subscore is 5. GCS motor subscore is 6.     Cranial Nerves: No cranial nerve deficit.     Sensory: No sensory deficit.     Coordination: Coordination normal.  Psychiatric:        Speech: Speech normal.        Behavior: Behavior normal.        Thought Content: Thought content normal.     ED Results / Procedures / Treatments   Labs (all labs ordered are listed, but only abnormal results are displayed) Labs Reviewed  CBC WITH DIFFERENTIAL/PLATELET - Abnormal; Notable  for the following components:      Result Value   RBC 2.31 (*)    Hemoglobin 7.5 (*)    HCT 22.3 (*)    All other components within normal limits  COMPREHENSIVE METABOLIC PANEL - Abnormal; Notable for the following components:   Sodium 132 (*)    Glucose, Bld 459 (*)    BUN 34 (*)    Creatinine, Ser 2.26 (*)    Calcium 7.7 (*)    Total Protein 5.5 (*)    Albumin 2.4 (*)    GFR, Estimated 29 (*)    All other components within normal limits  URINALYSIS, ROUTINE W REFLEX MICROSCOPIC - Abnormal; Notable for the following components:   Glucose, UA >=500 (*)    Hgb urine dipstick MODERATE (*)    Protein, ur 100 (*)    All other components within normal limits  BRAIN NATRIURETIC PEPTIDE - Abnormal; Notable for the following components:   B Natriuretic Peptide 962.5 (*)    All other components within normal limits  URINALYSIS, MICROSCOPIC (REFLEX) - Abnormal; Notable for the following components:   Bacteria, UA RARE (*)    All other components within normal limits  CBG MONITORING, ED - Abnormal; Notable for the following components:   Glucose-Capillary 431 (*)    All other components within normal limits  RESPIRATORY PANEL BY RT PCR (FLU A&B, COVID)  PREGNANCY, URINE    EKG EKG Interpretation  Date/Time:  Monday June 14 2020 00:38:10 EDT Ventricular Rate:  83 PR Interval:    QRS Duration: 95 QT Interval:  397 QTC Calculation: 467 R Axis:   29 Text Interpretation: Sinus rhythm Probable left atrial enlargement Abnormal Q suggests anterior infarct Borderline repolarization  abnormality Confirmed by Orpah Greek [phone]) on 06/14/2020 12:42:07 AM   Radiology DG Chest Port 1 View  Result Date: 06/14/2020 CLINICAL DATA:  Shortness of breath since yesterday. History of CHF. EXAM: PORTABLE CHEST 1 VIEW COMPARISON:  04/14/2020 FINDINGS: Cardiac enlargement with pulmonary vascular congestion and perihilar and basilar infiltration, likely edema. No definite pleural  effusions. No pneumothorax. Mediastinal contours appear intact. Progression of pulmonary changes since previous study. IMPRESSION: Cardiac enlargement with pulmonary vascular congestion and perihilar and basilar edema. Electronically Signed   By: Lucienne Capers M.D.   On: 06/14/2020 01:15    Procedures Procedures (including critical care time)  Medications Ordered in ED Medications  furosemide (LASIX) injection 80 mg (80 mg Intravenous Given 06/14/20 0136)  insulin aspart (novoLOG) injection 10 Units (10 Units Subcutaneous Given 06/14/20 0233)    ED Course  I have reviewed the triage vital signs and the nursing notes.  Pertinent labs & imaging results that were available during my care of the patient were reviewed by me and considered in my medical decision making (see chart for details).    MDM Rules/Calculators/A&P                          Patient with history of diabetes, congestive heart failure (postpartum cardiomyopathy), end-stage renal disease presents to the emergency department for evaluation of shortness of breath. Patient has dyspnea on exertion and orthopnea. Work-up is consistent with CHF exacerbation. Patient has been taking double her Lasix dose for the last 2 days but has not had any increased urine output. She has primarily shortness of breath and abdominal distention with her heart failure, likely not absorbing the oral Lasix. Patient given IV Lasix in the emergency department and has had good diuresis.  Creatinine today is 2.26. On September 21 at [facility] she had a creatinine of 2.1. This resulted in her spironolactone dose to be decreased which might be adding to her volume overload.   She is not hypoxic. She is able to ambulate here in the emergency department without difficulty. She will therefore not require hospitalization. We will continue double Lasix dosing for the next 3 days and contact her cardiologist (already has outpatient echo scheduled).  Final  Clinical Impression(s) / ED Diagnoses Final diagnoses:  Acute on chronic congestive heart failure, unspecified heart failure type [facility])    Rx / DC Orders ED Discharge Orders    None       Kaitlan Bin, Gwenyth Allegra, MD 06/14/20 [phone]

## 2020-06-27 ENCOUNTER — Emergency Department (HOSPITAL_BASED_OUTPATIENT_CLINIC_OR_DEPARTMENT_OTHER)
Admission: EM | Admit: 2020-06-27 | Discharge: 2020-06-27 | Disposition: A | Discharge status: Home / Self Care | Payer: Managed Care, Other (non HMO) | Attending: Emergency Medicine | Admitting: Emergency Medicine

## 2020-06-27 ENCOUNTER — Other Ambulatory Visit: Payer: Self-pay

## 2020-06-27 ENCOUNTER — Encounter (HOSPITAL_BASED_OUTPATIENT_CLINIC_OR_DEPARTMENT_OTHER): Payer: Self-pay | Admitting: Emergency Medicine

## 2020-06-27 ENCOUNTER — Emergency Department (HOSPITAL_BASED_OUTPATIENT_CLINIC_OR_DEPARTMENT_OTHER): Payer: Managed Care, Other (non HMO)

## 2020-06-27 DIAGNOSIS — R0602 Shortness of breath: Secondary | ICD-10-CM | POA: Insufficient documentation

## 2020-06-27 DIAGNOSIS — I130 Hypertensive heart and chronic kidney disease with heart failure and stage 1 through stage 4 chronic kidney disease, or unspecified chronic kidney disease: Secondary | ICD-10-CM | POA: Diagnosis not present

## 2020-06-27 DIAGNOSIS — Z794 Long term (current) use of insulin: Secondary | ICD-10-CM | POA: Insufficient documentation

## 2020-06-27 DIAGNOSIS — Z20822 Contact with and (suspected) exposure to covid-19: Secondary | ICD-10-CM | POA: Insufficient documentation

## 2020-06-27 DIAGNOSIS — Z79899 Other long term (current) drug therapy: Secondary | ICD-10-CM | POA: Insufficient documentation

## 2020-06-27 DIAGNOSIS — I509 Heart failure, unspecified: Secondary | ICD-10-CM | POA: Diagnosis not present

## 2020-06-27 DIAGNOSIS — E1010 Type 1 diabetes mellitus with ketoacidosis without coma: Secondary | ICD-10-CM | POA: Insufficient documentation

## 2020-06-27 DIAGNOSIS — Z87891 Personal history of nicotine dependence: Secondary | ICD-10-CM | POA: Diagnosis not present

## 2020-06-27 DIAGNOSIS — E1022 Type 1 diabetes mellitus with diabetic chronic kidney disease: Secondary | ICD-10-CM | POA: Insufficient documentation

## 2020-06-27 DIAGNOSIS — N183 Chronic kidney disease, stage 3 unspecified: Secondary | ICD-10-CM | POA: Diagnosis not present

## 2020-06-27 DIAGNOSIS — E1065 Type 1 diabetes mellitus with hyperglycemia: Secondary | ICD-10-CM | POA: Diagnosis not present

## 2020-06-27 LAB — BASIC METABOLIC PANEL
Anion gap: 7 (ref 5–15)
BUN: 31 mg/dL — ABNORMAL HIGH (ref 6–20)
CO2: 26 mmol/L (ref 22–32)
Calcium: 7.9 mg/dL — ABNORMAL LOW (ref 8.9–10.3)
Chloride: 103 mmol/L (ref 98–111)
Creatinine, Ser: 2.13 mg/dL — ABNORMAL HIGH (ref 0.44–1.00)
GFR, Estimated: 33 mL/min — ABNORMAL LOW (ref >60)
Glucose, Bld: 202 mg/dL — ABNORMAL HIGH (ref 70–99)
Potassium: 4.7 mmol/L (ref 3.5–5.1)
Sodium: 136 mmol/L (ref 135–145)

## 2020-06-27 LAB — CBC
HCT: 23.1 % — ABNORMAL LOW (ref 36.0–46.0)
Hemoglobin: 7.7 g/dL — ABNORMAL LOW (ref 12.0–15.0)
MCH: 32.6 pg (ref 26.0–34.0)
MCHC: 33.3 g/dL (ref 30.0–36.0)
MCV: 97.9 fL (ref 80.0–100.0)
Platelets: 192 10*3/uL (ref 150–400)
RBC: 2.36 MIL/uL — ABNORMAL LOW (ref 3.87–5.11)
RDW: 13.9 % (ref 11.5–15.5)
WBC: 4.7 10*3/uL (ref 4.0–10.5)
nRBC: 0 % (ref 0.0–0.2)

## 2020-06-27 LAB — RESPIRATORY PANEL BY RT PCR (FLU A&B, COVID)
Influenza A by PCR: NEGATIVE
Influenza B by PCR: NEGATIVE
SARS Coronavirus 2 by RT PCR: NEGATIVE

## 2020-06-27 LAB — BRAIN NATRIURETIC PEPTIDE: B Natriuretic Peptide: 1011.8 pg/mL — ABNORMAL HIGH (ref 0.0–100.0)

## 2020-06-27 LAB — PREGNANCY, URINE: Preg Test, Ur: NEGATIVE

## 2020-06-27 MED ORDER — FUROSEMIDE 10 MG/ML IJ SOLN
40.0000 mg | Freq: Once | INTRAMUSCULAR | Status: AC
  Administered 2020-06-27: 40 mg via INTRAVENOUS
  Filled 2020-06-27: qty 4

## 2020-06-27 NOTE — Discharge Instructions (Addendum)
Follow-up with your cardiologist as already scheduled.  I recommend taking Lasix 20 mg twice a day.  Please return to the ED if symptoms worsen.

## 2020-06-27 NOTE — ED Notes (Signed)
Up to restroom without assistance, voided a large amt

## 2020-06-27 NOTE — ED Triage Notes (Signed)
Reports being in overload again.  Seen last Sunday for the same.  Has gained 6 pounds over night.  Reports cardiology wanted her checked due to being on lasix and not on potassium supplement.  Breathing easy and non labored in triage.

## 2020-06-27 NOTE — ED Provider Notes (Signed)
[facility] EMERGENCY DEPARTMENT Provider Note   CSN: [id number] Arrival date & time: 06/27/20  1942     History Chief Complaint  Patient presents with  . Shortness of Breath    Kathy Lewis is a 24 y.o. female.  The history is provided by the patient.  Shortness of Breath Severity:  Moderate Onset quality:  Gradual Timing:  Constant Progression:  Unchanged Chronicity:  Recurrent Context comment:  Concern for ongoing fluid overload. Recent adjustments to lasix with no much effect. Still above dry weight. SOB when walking.  Relieved by:  Nothing Worsened by:  Activity Associated symptoms: no abdominal pain, no chest pain, no claudication, no cough, no diaphoresis, no ear pain, no fever, no headaches, no hemoptysis, no neck pain, no PND, no rash, no sore throat, no sputum production, no syncope, no swollen glands, no vomiting and no wheezing   Risk factors: no hx of PE/DVT        Past Medical History:  Diagnosis Date  . Anxiety   . Back spasm   . CKD (chronic kidney disease), stage III ([place])   . Diabetes mellitus without complication (HCC)    Type 1  . DKA (diabetic ketoacidoses)   . HSV infection    on valtrex  . Hypokalemia   . Leukocytosis   . Noncompliance with medication regimen   . Preeclampsia   . Severe anemia     Patient Active Problem List   Diagnosis Date Noted  . Fever 10/28/2019  . Preeclampsia 10/15/2019  . Severe anemia 10/13/2019  . Pre-existing type 1 diabetes mellitus with hyperglycemia during pregnancy in first trimester ([place]) 04/30/2019  . Hypokalemia   . DKA (diabetic ketoacidoses) 12/18/2014  . Leukocytosis 12/18/2014  . Noncompliance with medications 12/18/2014  . Type 1 diabetes mellitus with ketoacidosis, uncontrolled ([place]) 12/18/2014  . Nausea and vomiting 12/18/2014    Past Surgical History:  Procedure Laterality Date  . DILATION AND EVACUATION N/A 10/22/2019   Procedure: ULTRASOUND GUIDED DILATATION AND EVACUATION;   Surgeon: [name], MD;  Location: [facility] LD [facility]S;  Service: Gynecology;  Laterality: N/A;  . NO PAST SURGERIES       OB History    Gravida  2   Para  1   Term      Preterm  1   AB  1   Living  1     SAB  1   TAB      Ectopic      Multiple  0   Live Births  1           Family History  Adopted: Yes  Family history unknown: Yes    Social History   Tobacco Use  . Smoking status: Former Research scientist (life sciences)  . Smokeless tobacco: Never Used  Vaping Use  . Vaping Use: Never used  Substance Use Topics  . Alcohol use: No  . Drug use: No    Home Medications Prior to Admission medications   Medication Sig Start Date End Date Taking? Authorizing Provider  acetaminophen (TYLENOL) 325 MG tablet Take 2 tablets (650 mg total) by mouth every 4 (four) hours as needed (for pain scale < 4). 10/27/19   [name], MD  acetaminophen (TYLENOL) 500 MG tablet Take 500 mg by mouth every 6 (six) hours as needed for mild pain.    [provider]  albuterol (VENTOLIN HFA) 108 (90 Base) MCG/ACT inhaler Inhale into the lungs. 01/15/20 01/14/21  [provider]  albuterol (VENTOLIN HFA)  108 (90 Base) MCG/ACT inhaler Inhale into the lungs. 01/15/20   [provider]  butalbital-acetaminophen-caffeine (FIORICET) 50-325-40 MG tablet Take by mouth. 02/15/20   [provider]  carvedilol (COREG) 3.125 MG tablet Take by mouth. 04/13/20   [provider]  escitalopram (LEXAPRO) 10 MG tablet Take 10 mg by mouth daily. 05/10/20   [provider]  escitalopram (LEXAPRO) 5 MG tablet Take 3 tablets (15 mg total) by mouth daily. Patient not taking: Reported on 10/30/2019 10/28/19   [name], MD  furosemide (LASIX) 20 MG tablet Take by mouth. 04/20/20   [provider]  furosemide (LASIX) 40 MG tablet Take 1 tablet (40 mg total) by mouth daily. 10/28/19   [name], MD  insulin aspart (NOVOLOG) 100 UNIT/ML injection Inject 2 Units into the skin  3 (three) times daily with meals. 10/30/19   Christophe Louis, MD  insulin aspart (NOVOLOG) 100 UNIT/ML injection Inject into the skin. 10/30/19   [provider]  insulin detemir (LEVEMIR) 100 UNIT/ML injection Inject 0.08 mLs (8 Units total) into the skin daily. 10/31/19   Christophe Louis, MD  insulin detemir (LEVEMIR) 100 UNIT/ML injection Inject into the skin. 10/31/19   [provider]  Insulin Syringe-Needle U-100 (INSULIN SYRINGE .3CC/31GX5/16") 31G X 5/16" 0.3 ML MISC Use as directed 10/27/19   [name], MD  lisinopril (ZESTRIL) 10 MG tablet Take by mouth. 02/23/20   [provider]  NIFEdipine (ADALAT CC) 60 MG 24 hr tablet Take 1 tablet (60 mg total) by mouth daily. 10/28/19   [name], MD  ondansetron (ZOFRAN) 4 MG tablet Take 1 tablet (4 mg total) by mouth every 8 (eight) hours as needed for nausea or vomiting. Patient not taking: Reported on 10/30/2019 06/17/19   Tamala Julian, [place], CNM  oxyCODONE (OXY IR/ROXICODONE) 5 MG immediate release tablet Take 1 tablet (5 mg total) by mouth every 4 (four) hours as needed (pain scale 4-7). Patient not taking: Reported on 10/30/2019 10/27/19   [name], MD  potassium chloride (KLOR-CON) 10 MEQ tablet Take 1 tablet (10 mEq total) by mouth daily. 10/27/19   [name], MD  Prenatal Vit-Fe Fumarate-FA (PRENATAL MULTIVITAMIN) TABS tablet Take 1 tablet by mouth at bedtime.    [provider]  spironolactone (ALDACTONE) 25 MG tablet Take by mouth. 04/20/20   [provider]  Vitamin D, Ergocalciferol, (DRISDOL) 1.25 MG (50000 UNIT) CAPS capsule Take 50,000 Units by mouth every 7 (seven) days.    [provider]    Allergies    Cantaloupe extract allergy skin test and Nsaids  Review of Systems   Review of Systems  Constitutional: Negative for chills, diaphoresis and fever.  HENT: Negative for ear pain and sore throat.   Eyes: Negative for pain and visual disturbance.  Respiratory: Positive for shortness  of breath. Negative for cough, hemoptysis, sputum production and wheezing.   Cardiovascular: Negative for chest pain, palpitations, claudication, syncope and PND.  Gastrointestinal: Negative for abdominal pain and vomiting.  Genitourinary: Negative for dysuria and hematuria.  Musculoskeletal: Negative for arthralgias, back pain and neck pain.  Skin: Negative for color change and rash.  Neurological: Negative for seizures, syncope and headaches.  All other systems reviewed and are negative.   Physical Exam Updated Vital Signs  ED Triage Vitals  Enc Vitals Group     BP 06/27/20 1949 (!) 125/91     Pulse Rate 06/27/20 1949 83     Resp 06/27/20 1949 18     Temp  06/27/20 1949 98.6 F (37 C)     Temp Source 06/27/20 1949 Oral     SpO2 06/27/20 1948 98 %     Weight 06/27/20 1949 137 lb (62.1 kg)     Height 06/27/20 1949 5' (1.524 m)     Head Circumference --      Peak Flow --      Pain Score 06/27/20 1949 0     Pain Loc --      Pain Edu? --      Excl. in [place]? --     Physical Exam Vitals and nursing note reviewed.  Constitutional:      General: She is not in acute distress.    Appearance: She is well-developed. She is not ill-appearing.  HENT:     Head: Normocephalic and atraumatic.  Eyes:     Extraocular Movements: Extraocular movements intact.     Conjunctiva/sclera: Conjunctivae normal.     Pupils: Pupils are equal, round, and reactive to light.  Cardiovascular:     Rate and Rhythm: Normal rate and regular rhythm.     Pulses: Normal pulses.     Heart sounds: Normal heart sounds. No murmur heard.   Pulmonary:     Effort: Pulmonary effort is normal. No tachypnea or respiratory distress.     Breath sounds: Normal breath sounds. No decreased breath sounds or wheezing.  Abdominal:     Palpations: Abdomen is soft.     Tenderness: There is no abdominal tenderness.  Musculoskeletal:     Cervical back: Normal range of motion and neck supple.     Right lower leg: No edema.      Left lower leg: No edema.  Skin:    General: Skin is warm and dry.     Capillary Refill: Capillary refill takes less than 2 seconds.  Neurological:     General: No focal deficit present.     Mental Status: She is alert.     ED Results / Procedures / Treatments   Labs (all labs ordered are listed, but only abnormal results are displayed) Labs Reviewed  BASIC METABOLIC PANEL - Abnormal; Notable for the following components:      Result Value   Glucose, Bld 202 (*)    BUN 31 (*)    Creatinine, Ser 2.13 (*)    Calcium 7.9 (*)    GFR, Estimated 33 (*)    All other components within normal limits  CBC - Abnormal; Notable for the following components:   RBC 2.36 (*)    Hemoglobin 7.7 (*)    HCT 23.1 (*)    All other components within normal limits  BRAIN NATRIURETIC PEPTIDE - Abnormal; Notable for the following components:   B Natriuretic Peptide 1,011.8 (*)    All other components within normal limits  RESPIRATORY PANEL BY RT PCR (FLU A&B, COVID)  PREGNANCY, URINE    EKG EKG Interpretation  Date/Time:  Sunday June 27 2020 19:55:12 EDT Ventricular Rate:  80 PR Interval:  152 QRS Duration: 80 QT Interval:  418 QTC Calculation: 482 R Axis:   67 Text Interpretation: Normal sinus rhythm Nonspecific ST and T wave abnormality Prolonged QT Abnormal ECG Confirmed by Lennice Sites [phone]) on 06/27/2020 8:02:41 PM   Radiology DG Chest 2 View  Result Date: 06/27/2020 CLINICAL DATA:  Rapid weight gain overnight with suspected volume overload. EXAM: CHEST - 2 VIEW COMPARISON:  June 14, 2020 FINDINGS: Mild diffusely increased interstitial lung markings are seen. Mild areas of atelectasis  and/or infiltrate are noted within the bilateral lung bases. There is no evidence of a pleural effusion or pneumothorax. The cardiac silhouette is moderately enlarged and unchanged in size. The visualized skeletal structures are unremarkable. IMPRESSION: 1. Stable cardiomegaly with mild  interstitial edema. 2. Mild bibasilar atelectasis and/or infiltrate. Electronically Signed   By: Virgina Norfolk M.D.   On: 06/27/2020 20:19    Procedures Procedures (including critical care time)  Medications Ordered in ED Medications  furosemide (LASIX) injection 40 mg (40 mg Intravenous Given 06/27/20 2108)    ED Course  I have reviewed the triage vital signs and the nursing notes.  Pertinent labs & imaging results that were available during my care of the patient were reviewed by me and considered in my medical decision making (see chart for details).    MDM Rules/Calculators/A&P                          Shyne Resch is a 24 year old female history of diabetes, cardiomyopathy followed pregnancy presents the ED with shortness of breath.  Normal vitals.  No fever.  Overall normal work of breathing.  No major leg edema but she states that she does not get much leg edema.  She has noticed some increased weight over the last several days from her dry weight.  She has had multiple changes to Lasix over the last several weeks.  She is on room air breathing well with 100% oxygenation.  This stays the same with ambulation as well.  Chest x-ray showed some mild interstitial edema.  EKG showed sinus rhythm.  No obvious ischemic changes.  She is not having any chest pain.  No concern for PE as she is not hypoxic and suspect that she has some mild fluid overload.  She was given a dose of IV Lasix.  Her BNP is around 1000 which is what it was several weeks ago when she was discharged from the ED.  Covid test was negative.  No significant electrolyte abnormality.  Creatinine at baseline.  Potassium normal at 4.7.  She has follow-up with cardiology in 2 days.  Recommend that she increase her Lasix to 20 mg twice a day.  Given return precautions and discharged in good condition.  This chart was dictated using voice recognition software.  Despite best efforts to proofread,  errors can occur which can  change the documentation meaning.    Final Clinical Impression(s) / ED Diagnoses Final diagnoses:  Shortness of breath  Acute on chronic congestive heart failure, unspecified heart failure type [facility])    Rx / DC Orders ED Discharge Orders    None       Lennice Sites, DO 06/27/20 2305

## 2020-06-27 NOTE — ED Notes (Signed)
AVS reviewed with client of ED MD recommendations, pt instructed to take Lasix twice a day, and to ensure she follows up with her Cardiologist on Tuesday as scheduled. Opportunity for questions provided

## 2020-07-10 ENCOUNTER — Other Ambulatory Visit: Payer: Self-pay

## 2020-07-10 ENCOUNTER — Emergency Department (HOSPITAL_BASED_OUTPATIENT_CLINIC_OR_DEPARTMENT_OTHER): Payer: Managed Care, Other (non HMO)

## 2020-07-10 ENCOUNTER — Inpatient Hospital Stay (HOSPITAL_BASED_OUTPATIENT_CLINIC_OR_DEPARTMENT_OTHER)
Admission: EM | Admit: 2020-07-10 | Discharge: 2020-07-11 | DRG: 291 | Discharge status: Against Medical Advice | Payer: Managed Care, Other (non HMO) | Attending: Internal Medicine | Admitting: Internal Medicine

## 2020-07-10 ENCOUNTER — Encounter (HOSPITAL_BASED_OUTPATIENT_CLINIC_OR_DEPARTMENT_OTHER): Payer: Self-pay

## 2020-07-10 DIAGNOSIS — E8809 Other disorders of plasma-protein metabolism, not elsewhere classified: Secondary | ICD-10-CM | POA: Diagnosis present

## 2020-07-10 DIAGNOSIS — I5023 Acute on chronic systolic (congestive) heart failure: Secondary | ICD-10-CM | POA: Diagnosis present

## 2020-07-10 DIAGNOSIS — F32A Depression, unspecified: Secondary | ICD-10-CM | POA: Diagnosis present

## 2020-07-10 DIAGNOSIS — Z87891 Personal history of nicotine dependence: Secondary | ICD-10-CM | POA: Diagnosis not present

## 2020-07-10 DIAGNOSIS — Z20822 Contact with and (suspected) exposure to covid-19: Secondary | ICD-10-CM | POA: Diagnosis present

## 2020-07-10 DIAGNOSIS — I5022 Chronic systolic (congestive) heart failure: Secondary | ICD-10-CM | POA: Diagnosis present

## 2020-07-10 DIAGNOSIS — E1010 Type 1 diabetes mellitus with ketoacidosis without coma: Secondary | ICD-10-CM | POA: Diagnosis present

## 2020-07-10 DIAGNOSIS — I509 Heart failure, unspecified: Secondary | ICD-10-CM | POA: Insufficient documentation

## 2020-07-10 DIAGNOSIS — F419 Anxiety disorder, unspecified: Secondary | ICD-10-CM | POA: Diagnosis present

## 2020-07-10 DIAGNOSIS — Z5329 Procedure and treatment not carried out because of patient's decision for other reasons: Secondary | ICD-10-CM | POA: Diagnosis present

## 2020-07-10 DIAGNOSIS — N185 Chronic kidney disease, stage 5: Secondary | ICD-10-CM | POA: Diagnosis present

## 2020-07-10 DIAGNOSIS — Z79899 Other long term (current) drug therapy: Secondary | ICD-10-CM

## 2020-07-10 DIAGNOSIS — E1022 Type 1 diabetes mellitus with diabetic chronic kidney disease: Secondary | ICD-10-CM | POA: Diagnosis present

## 2020-07-10 DIAGNOSIS — Z9641 Presence of insulin pump (external) (internal): Secondary | ICD-10-CM | POA: Diagnosis present

## 2020-07-10 DIAGNOSIS — I130 Hypertensive heart and chronic kidney disease with heart failure and stage 1 through stage 4 chronic kidney disease, or unspecified chronic kidney disease: Principal | ICD-10-CM | POA: Diagnosis present

## 2020-07-10 DIAGNOSIS — N183 Chronic kidney disease, stage 3 unspecified: Secondary | ICD-10-CM | POA: Diagnosis present

## 2020-07-10 DIAGNOSIS — D649 Anemia, unspecified: Secondary | ICD-10-CM | POA: Diagnosis present

## 2020-07-10 DIAGNOSIS — R9431 Abnormal electrocardiogram [ECG] [EKG]: Secondary | ICD-10-CM | POA: Diagnosis present

## 2020-07-10 LAB — CBC WITH DIFFERENTIAL/PLATELET
Abs Immature Granulocytes: 0.01 10*3/uL (ref 0.00–0.07)
Basophils Absolute: 0 10*3/uL (ref 0.0–0.1)
Basophils Relative: 1 %
Eosinophils Absolute: 0.1 10*3/uL (ref 0.0–0.5)
Eosinophils Relative: 3 %
HCT: 24.8 % — ABNORMAL LOW (ref 36.0–46.0)
Hemoglobin: 8.1 g/dL — ABNORMAL LOW (ref 12.0–15.0)
Immature Granulocytes: 0 %
Lymphocytes Relative: 36 %
Lymphs Abs: 1.4 10*3/uL (ref 0.7–4.0)
MCH: 31.6 pg (ref 26.0–34.0)
MCHC: 32.7 g/dL (ref 30.0–36.0)
MCV: 96.9 fL (ref 80.0–100.0)
Monocytes Absolute: 0.2 10*3/uL (ref 0.1–1.0)
Monocytes Relative: 4 %
Neutro Abs: 2.2 10*3/uL (ref 1.7–7.7)
Neutrophils Relative %: 56 %
Platelets: 160 10*3/uL (ref 150–400)
RBC: 2.56 MIL/uL — ABNORMAL LOW (ref 3.87–5.11)
RDW: 13.9 % (ref 11.5–15.5)
WBC: 3.9 10*3/uL — ABNORMAL LOW (ref 4.0–10.5)
nRBC: 0 % (ref 0.0–0.2)

## 2020-07-10 LAB — COMPREHENSIVE METABOLIC PANEL
ALT: 11 U/L (ref 0–44)
AST: 14 U/L — ABNORMAL LOW (ref 15–41)
Albumin: 2.3 g/dL — ABNORMAL LOW (ref 3.5–5.0)
Alkaline Phosphatase: 69 U/L (ref 38–126)
Anion gap: 6 (ref 5–15)
BUN: 28 mg/dL — ABNORMAL HIGH (ref 6–20)
CO2: 24 mmol/L (ref 22–32)
Calcium: 7.7 mg/dL — ABNORMAL LOW (ref 8.9–10.3)
Chloride: 107 mmol/L (ref 98–111)
Creatinine, Ser: 2.37 mg/dL — ABNORMAL HIGH (ref 0.44–1.00)
GFR, Estimated: 29 mL/min — ABNORMAL LOW (ref >60)
Glucose, Bld: 95 mg/dL (ref 70–99)
Potassium: 3.9 mmol/L (ref 3.5–5.1)
Sodium: 137 mmol/L (ref 135–145)
Total Bilirubin: 0.5 mg/dL (ref 0.3–1.2)
Total Protein: 5.4 g/dL — ABNORMAL LOW (ref 6.5–8.1)

## 2020-07-10 LAB — CBG MONITORING, ED
Glucose-Capillary: 50 mg/dL — ABNORMAL LOW (ref 70–99)
Glucose-Capillary: 134 mg/dL — ABNORMAL HIGH (ref 70–99)

## 2020-07-10 LAB — RESPIRATORY PANEL BY RT PCR (FLU A&B, COVID)
Influenza A by PCR: NEGATIVE
Influenza B by PCR: NEGATIVE
SARS Coronavirus 2 by RT PCR: NEGATIVE

## 2020-07-10 LAB — TROPONIN I (HIGH SENSITIVITY)
Troponin I (High Sensitivity): 7 ng/L (ref <18)
Troponin I (High Sensitivity): 7 ng/L (ref <18)

## 2020-07-10 LAB — PREGNANCY, URINE: Preg Test, Ur: NEGATIVE

## 2020-07-10 LAB — BRAIN NATRIURETIC PEPTIDE: B Natriuretic Peptide: 1190 pg/mL — ABNORMAL HIGH (ref 0.0–100.0)

## 2020-07-10 MED ORDER — FUROSEMIDE 10 MG/ML IJ SOLN
20.0000 mg | Freq: Once | INTRAMUSCULAR | Status: AC
  Administered 2020-07-10: 20 mg via INTRAVENOUS
  Filled 2020-07-10: qty 2

## 2020-07-10 NOTE — ED Notes (Signed)
Pt voided w/o difficulty in BR

## 2020-07-10 NOTE — ED Notes (Signed)
Pt called out re: felt like blood sugar was low; cbg= 50; 4 oz OJ given, as well as Sprite and peanut butter crackers

## 2020-07-10 NOTE — ED Notes (Signed)
Pt eating at this time.

## 2020-07-10 NOTE — ED Provider Notes (Signed)
Emergency Department Provider Note   I have reviewed the triage vital signs and the nursing notes.   HISTORY  Chief Complaint Fatigue   HPI Kathy Lewis is a 24 y.o. 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  Symptoms are worse when lying flat and improved with sitting up.  Her weight today was 135 pounds which is slightly above her dry weight.  She notes that she tends to carry fluid in her abdomen which does feel mildly distended.  No significant lower extremity swelling noted.  She denies fevers.  She is not actively having chest pain or shortness of breath.  She is been compliant with her Lasix which is currently dosed at 40 mg daily.  No radiation of symptoms or other modifying factors.  Past Medical History:  Diagnosis Date  . Anxiety   . Back spasm   . CKD (chronic kidney disease), stage III ([place])   . Diabetes mellitus without complication (HCC)    Type 1  . DKA (diabetic ketoacidoses)   . HSV infection    on valtrex  . Hypokalemia   . Leukocytosis   . Noncompliance with medication regimen   . Preeclampsia   . Severe anemia     Patient Active Problem List   Diagnosis Date Noted  . CHF exacerbation ([place]) 07/10/2020  . Fever 10/28/2019  . Preeclampsia 10/15/2019  . Severe anemia 10/13/2019  . Pre-existing type 1 diabetes mellitus with hyperglycemia during pregnancy in first trimester ([place]) 04/30/2019  . Hypokalemia   . DKA (diabetic ketoacidoses) 12/18/2014  . Leukocytosis 12/18/2014  . Noncompliance with medications 12/18/2014  . Type 1 diabetes mellitus with ketoacidosis, uncontrolled ([place]) 12/18/2014  . Nausea and vomiting 12/18/2014    Past Surgical History:  Procedure Laterality Date  . DILATION AND EVACUATION  N/A 10/22/2019   Procedure: ULTRASOUND GUIDED DILATATION AND EVACUATION;  Surgeon: [name], MD;  Location: [facility] LD ORS;  Service: Gynecology;  Laterality: N/A;  . NO PAST SURGERIES      Allergies Cantaloupe extract allergy skin test and Nsaids  Family History  Adopted: Yes  Family history unknown: Yes    Social History Social History   Tobacco Use  . Smoking status: Former Research scientist (life sciences)  . Smokeless tobacco: Never Used  Vaping Use  . Vaping Use: Never used  Substance Use Topics  . Alcohol use: No  . Drug use: No    Review of Systems  Constitutional: No fever/chills Eyes: No visual changes. ENT: No sore throat. Cardiovascular: Positive chest pain. Respiratory: Positive shortness of breath this AM when laying flat.  Gastrointestinal: No abdominal pain.  No nausea, no vomiting.  No diarrhea.  No constipation. Genitourinary: Negative for dysuria. Musculoskeletal: Negative for back pain. Skin: Negative for rash. Neurological: Negative for headaches, focal weakness or numbness.  10-point ROS otherwise negative.  ____________________________________________   PHYSICAL EXAM:  VITAL SIGNS: ED Triage Vitals  Enc Vitals Group     BP 07/10/20 1619 (!) 138/108     Pulse Rate 07/10/20 1619 90     Resp 07/10/20 1619 16     Temp 07/10/20 1619 98.5 F (36.9 C)     Temp Source 07/10/20 1619 Oral     SpO2 07/10/20 1619 100 %     Weight 07/10/20 1620 135 lb (  61.2 kg)     Height 07/10/20 1620 5' (1.524 m)   Constitutional: Alert and oriented. Well appearing and in no acute distress. Ambulatory around the room without difficulty.  Eyes: Conjunctivae are normal. Head: Atraumatic. Nose: No congestion/rhinnorhea. Mouth/Throat: Mucous membranes are moist.  Neck: No stridor.   Cardiovascular: Normal rate, regular rhythm. Good peripheral circulation. Grossly normal heart sounds.   Respiratory: Normal respiratory effort.  No retractions. Lungs CTAB. No rales or rhonchi.    Gastrointestinal: Soft and nontender. Mild distention.  Musculoskeletal: No lower extremity tenderness. No gross deformities of extremities. No LE edema.  Neurologic:  Normal speech and language. No gross focal neurologic deficits are appreciated.  Skin:  Skin is warm, dry and intact. No rash noted.  ____________________________________________   LABS (all labs ordered are listed, but only abnormal results are displayed)  Labs Reviewed  COMPREHENSIVE METABOLIC PANEL - Abnormal; Notable for the following components:      Result Value   BUN 28 (*)    Creatinine, Ser 2.37 (*)    Calcium 7.7 (*)    Total Protein 5.4 (*)    Albumin 2.3 (*)    AST 14 (*)    GFR, Estimated 29 (*)    All other components within normal limits  BRAIN NATRIURETIC PEPTIDE - Abnormal; Notable for the following components:   B Natriuretic Peptide 1,190.0 (*)    All other components within normal limits  CBC WITH DIFFERENTIAL/PLATELET - Abnormal; Notable for the following components:   WBC 3.9 (*)    RBC 2.56 (*)    Hemoglobin 8.1 (*)    HCT 24.8 (*)    All other components within normal limits  CBG MONITORING, ED - Abnormal; Notable for the following components:   Glucose-Capillary 50 (*)    All other components within normal limits  CBG MONITORING, ED - Abnormal; Notable for the following components:   Glucose-Capillary 134 (*)    All other components within normal limits  RESPIRATORY PANEL BY RT PCR (FLU A&B, COVID)  PREGNANCY, URINE  TROPONIN I (HIGH SENSITIVITY)  TROPONIN I (HIGH SENSITIVITY)   ____________________________________________  EKG   EKG Interpretation  Date/Time:  Saturday July 10 2020 17:13:59 EST Ventricular Rate:  89 PR Interval:    QRS Duration: 99 QT Interval:  399 QTC Calculation: 486 R Axis:   52 Text Interpretation: Sinus rhythm Borderline T abnormalities, diffuse leads Borderline prolonged QT interval No significant change since last tracing No STEMI Confirmed  by Nanda Quinton [phone]) on 07/10/2020 5:16:22 PM       ____________________________________________  RADIOLOGY  DG Chest 2 View  Result Date: 07/10/2020 CLINICAL DATA:  Shortness of breath EXAM: CHEST - 2 VIEW COMPARISON:  June 27, 2020 FINDINGS: The cardiomediastinal silhouette is unchanged and enlarged in contour.Perihilar vascular fullness. No pleural effusion. No pneumothorax. Peribronchial cuffing. Bibasilar interstitial prominence. Visualized abdomen is unremarkable. No acute osseous abnormality. IMPRESSION: Constellation of findings are suggestive of cardiomegaly with pulmonary edema. Electronically Signed   By: Valentino Saxon MD   On: 07/10/2020 17:16    ____________________________________________   PROCEDURES  Procedure(s) performed:   Procedures  CRITICAL CARE Performed by: Margette Fast Total critical care time: 35 minutes Critical care time was exclusive of separately billable procedures and treating other patients. Critical care was necessary to treat or prevent imminent or life-threatening deterioration. Critical care was time spent personally by me on the following activities: development of treatment plan with patient and/or surrogate as well as nursing, discussions with  consultants, evaluation of patient's response to treatment, examination of patient, obtaining history from patient or surrogate, ordering and performing treatments and interventions, ordering and review of laboratory studies, ordering and review of radiographic studies, pulse oximetry and re-evaluation of patient's condition.  Nanda Quinton, MD Emergency Medicine  ____________________________________________   INITIAL IMPRESSION / ASSESSMENT AND PLAN / ED COURSE  Pertinent labs & imaging results that were available during my care of the patient were reviewed by me and considered in my medical decision making (see chart for details).   Patient presents emergency department with chest  discomfort earlier this morning with shortness of breath.  Some components of the history sound like possible PND vs OSA.  Given patient's age and body habitus I would think she would be lower risk for obstructive sleep apnea.  She does not appear acutely fluid overloaded.  She is in no acute distress.  Her oxygen saturations are normal.  Respiratory rate is normal.  She is speaking in full sentences and her lungs are clear on my exam.  Given her history, however, plan for work-up including chest x-ray, BNP, troponin, and EKG. Doubt infectious process.   Chest x-ray showing pulmonary edema pattern. BNP 1190. Patient ambulated and became short of breath and desatted to the low 80s.  She recovered quickly after returning to bed and did not require oxygen while at rest.  IV Lasix given. COVID negative.  Plan for admission for diuresis.  Discussed that her cardiology team is awake for this but she would prefer to not be admitted at that hospital.  Will discuss admit to [facility].   Discussed patient's case with [facility] to request admission. Patient and family (if present) updated with plan. Care transferred to [facility] service.  I reviewed all nursing notes, vitals, pertinent old records, EKGs, labs, imaging (as available).  ____________________________________________  FINAL CLINICAL IMPRESSION(S) / ED DIAGNOSES  Final diagnoses:  Acute on chronic systolic congestive heart failure ([place])    MEDICATIONS GIVEN DURING THIS VISIT:  Medications  furosemide (LASIX) injection 20 mg (20 mg Intravenous Given 07/10/20 1824)    Note:  This document was prepared using Dragon voice recognition software and may include unintentional dictation errors.  Nanda Quinton, MD, [facility] Emergency Medicine    Hassen Bruun, [facility] [employer], MD 07/10/20 [phone]

## 2020-07-10 NOTE — ED Triage Notes (Signed)
Pt arrives with reports of feeling fatigued states that she thinks she is not sleeping good because "when I fall asleep I stop breathing and it wakes me up".

## 2020-07-10 NOTE — ED Notes (Addendum)
O2 sat dropped to 84% while ambulating; pt sts she felt lightheaded and was noticeably more [facility] w/ exertion; recovered quickly back to 100% when resting.

## 2020-07-11 ENCOUNTER — Encounter (HOSPITAL_COMMUNITY): Payer: Self-pay | Admitting: Internal Medicine

## 2020-07-11 DIAGNOSIS — E8809 Other disorders of plasma-protein metabolism, not elsewhere classified: Secondary | ICD-10-CM | POA: Diagnosis present

## 2020-07-11 DIAGNOSIS — N185 Chronic kidney disease, stage 5: Secondary | ICD-10-CM | POA: Diagnosis present

## 2020-07-11 DIAGNOSIS — I5022 Chronic systolic (congestive) heart failure: Secondary | ICD-10-CM | POA: Diagnosis present

## 2020-07-11 DIAGNOSIS — F419 Anxiety disorder, unspecified: Secondary | ICD-10-CM | POA: Diagnosis present

## 2020-07-11 DIAGNOSIS — R9431 Abnormal electrocardiogram [ECG] [EKG]: Secondary | ICD-10-CM | POA: Diagnosis present

## 2020-07-11 DIAGNOSIS — N183 Chronic kidney disease, stage 3 unspecified: Secondary | ICD-10-CM | POA: Diagnosis present

## 2020-07-11 DIAGNOSIS — D649 Anemia, unspecified: Secondary | ICD-10-CM | POA: Diagnosis present

## 2020-07-11 DIAGNOSIS — I5023 Acute on chronic systolic (congestive) heart failure: Secondary | ICD-10-CM

## 2020-07-11 LAB — CBC
HCT: 26.2 % — ABNORMAL LOW (ref 36.0–46.0)
Hemoglobin: 8.6 g/dL — ABNORMAL LOW (ref 12.0–15.0)
MCH: 32 pg (ref 26.0–34.0)
MCHC: 32.8 g/dL (ref 30.0–36.0)
MCV: 97.4 fL (ref 80.0–100.0)
Platelets: 151 10*3/uL (ref 150–400)
RBC: 2.69 MIL/uL — ABNORMAL LOW (ref 3.87–5.11)
RDW: 13.7 % (ref 11.5–15.5)
WBC: 4.9 10*3/uL (ref 4.0–10.5)
nRBC: 0 % (ref 0.0–0.2)

## 2020-07-11 LAB — GLUCOSE, CAPILLARY
Glucose-Capillary: 123 mg/dL — ABNORMAL HIGH (ref 70–99)
Glucose-Capillary: 140 mg/dL — ABNORMAL HIGH (ref 70–99)
Glucose-Capillary: 226 mg/dL — ABNORMAL HIGH (ref 70–99)
Glucose-Capillary: 242 mg/dL — ABNORMAL HIGH (ref 70–99)
Glucose-Capillary: 249 mg/dL — ABNORMAL HIGH (ref 70–99)

## 2020-07-11 LAB — MAGNESIUM: Magnesium: 1.6 mg/dL — ABNORMAL LOW (ref 1.7–2.4)

## 2020-07-11 LAB — BASIC METABOLIC PANEL
Anion gap: 7 (ref 5–15)
BUN: 29 mg/dL — ABNORMAL HIGH (ref 6–20)
CO2: 24 mmol/L (ref 22–32)
Calcium: 8.1 mg/dL — ABNORMAL LOW (ref 8.9–10.3)
Chloride: 109 mmol/L (ref 98–111)
Creatinine, Ser: 2.68 mg/dL — ABNORMAL HIGH (ref 0.44–1.00)
GFR, Estimated: 25 mL/min — ABNORMAL LOW (ref >60)
Glucose, Bld: 168 mg/dL — ABNORMAL HIGH (ref 70–99)
Potassium: 3.9 mmol/L (ref 3.5–5.1)
Sodium: 140 mmol/L (ref 135–145)

## 2020-07-11 LAB — HIV ANTIBODY (ROUTINE TESTING W REFLEX): HIV Screen 4th Generation wRfx: NONREACTIVE

## 2020-07-11 LAB — HEMOGLOBIN A1C
Hgb A1c MFr Bld: 8.5 % — ABNORMAL HIGH (ref 4.8–5.6)
Mean Plasma Glucose: 197.25 mg/dL

## 2020-07-11 MED ORDER — ACETAMINOPHEN 325 MG PO TABS: 650.0000 mg | ORAL_TABLET | Freq: Four times a day (QID) | ORAL | Status: DC | PRN

## 2020-07-11 MED ORDER — VITAMIN D (ERGOCALCIFEROL) 1.25 MG (50000 UNIT) PO CAPS
50000.0000 [IU] | ORAL_CAPSULE | ORAL | Status: DC
  Administered 2020-07-11: 50000 [IU] via ORAL
  Filled 2020-07-11: qty 1

## 2020-07-11 MED ORDER — ENOXAPARIN SODIUM 30 MG/0.3ML ~~LOC~~ SOLN: 30.0000 mg | SUBCUTANEOUS | Status: DC

## 2020-07-11 MED ORDER — ONDANSETRON HCL 4 MG/2ML IJ SOLN: 4.0000 mg | Freq: Four times a day (QID) | INTRAMUSCULAR | Status: DC | PRN

## 2020-07-11 MED ORDER — ACETAMINOPHEN 650 MG RE SUPP: 650.0000 mg | Freq: Four times a day (QID) | RECTAL | Status: DC | PRN

## 2020-07-11 MED ORDER — INSULIN ASPART 100 UNIT/ML ~~LOC~~ SOLN
0.0000 [IU] | Freq: Three times a day (TID) | SUBCUTANEOUS | Status: DC
  Administered 2020-07-11: 3 [IU] via SUBCUTANEOUS
  Administered 2020-07-11: 1 [IU] via SUBCUTANEOUS
  Administered 2020-07-11: 5 [IU] via SUBCUTANEOUS

## 2020-07-11 MED ORDER — PROCHLORPERAZINE EDISYLATE 10 MG/2ML IJ SOLN
5.0000 mg | INTRAMUSCULAR | Status: DC | PRN
  Administered 2020-07-11: 5 mg via INTRAVENOUS
  Filled 2020-07-11 (×2): qty 1

## 2020-07-11 MED ORDER — BUTALBITAL-APAP-CAFFEINE 50-325-40 MG PO TABS: 1.0000 | ORAL_TABLET | ORAL | Status: DC | PRN

## 2020-07-11 MED ORDER — SPIRONOLACTONE 25 MG PO TABS
25.0000 mg | ORAL_TABLET | Freq: Every day | ORAL | Status: DC
  Administered 2020-07-11: 25 mg via ORAL
  Filled 2020-07-11: qty 1

## 2020-07-11 MED ORDER — INSULIN DETEMIR 100 UNIT/ML ~~LOC~~ SOLN
10.0000 [IU] | Freq: Every day | SUBCUTANEOUS | Status: DC
  Administered 2020-07-11: 10 [IU] via SUBCUTANEOUS
  Filled 2020-07-11: qty 0.1

## 2020-07-11 MED ORDER — FUROSEMIDE 10 MG/ML IJ SOLN
20.0000 mg | Freq: Two times a day (BID) | INTRAMUSCULAR | Status: DC
  Administered 2020-07-11 (×2): 20 mg via INTRAVENOUS
  Filled 2020-07-11 (×2): qty 2

## 2020-07-11 MED ORDER — HEPARIN SODIUM (PORCINE) 5000 UNIT/ML IJ SOLN
5000.0000 [IU] | Freq: Three times a day (TID) | INTRAMUSCULAR | Status: DC
  Administered 2020-07-11: 5000 [IU] via SUBCUTANEOUS
  Filled 2020-07-11: qty 1

## 2020-07-11 MED ORDER — ONDANSETRON HCL 4 MG PO TABS: 4.0000 mg | ORAL_TABLET | Freq: Four times a day (QID) | ORAL | Status: DC | PRN

## 2020-07-11 MED ORDER — LISINOPRIL 10 MG PO TABS
10.0000 mg | ORAL_TABLET | Freq: Every day | ORAL | Status: DC
  Administered 2020-07-11: 10 mg via ORAL
  Filled 2020-07-11 (×2): qty 1

## 2020-07-11 MED ORDER — ESCITALOPRAM OXALATE 10 MG PO TABS
20.0000 mg | ORAL_TABLET | Freq: Every day | ORAL | Status: DC
  Administered 2020-07-11: 20 mg via ORAL
  Filled 2020-07-11: qty 2

## 2020-07-11 NOTE — Progress Notes (Signed)
NURSING PROGRESS NOTE  Francille Wittmann [id number] Admission Data: 07/11/2020 7:36 AM Attending Provider: Desiree Hane, MD UVO:[id number], [facility] Code Status: full  Anthonia Monger is a 24 y.o. female patient admitted from ED:  -No acute distress noted.  -No complaints of shortness of breath.  -No complaints of chest pain.   Cardiac Monitoring: Box # 35 in place. Cardiac monitor yields:normal sinus rhythm.  Blood pressure (!) 143/100, pulse 87, temperature 98.8 F (37.1 C), temperature source Oral, resp. rate 18, height 5' (1.524 m), weight 63.3 kg, SpO2 97 %, currently breastfeeding.   IV Fluids:  IV in place, occlusive dsg intact without redness, IV cath antecubital right, condition patent and no redness none.   Allergies:  Cantaloupe extract allergy skin test and Nsaids  Past Medical History:   has a past medical history of Anxiety, Back spasm, CKD (chronic kidney disease), stage III ([place]), Diabetes mellitus without complication ([place]), DKA (diabetic ketoacidoses), HSV infection, Hypokalemia, Leukocytosis, Noncompliance with medication regimen, Preeclampsia, and Severe anemia.  Past Surgical History:   has a past surgical history that includes No past surgeries and Dilation and evacuation (N/A, 10/22/2019).  Social History:   reports that she has quit smoking. She has never used smokeless tobacco. She reports that she does not drink alcohol and does not use drugs.  Skin: intact  Patient/Family orientated to room. Information packet given to patient/family. Admission inpatient armband information verified with patient/family to include name and date of birth and placed on patient arm. Side rails up x 2, fall assessment and education completed with patient/family. Patient/family able to verbalize understanding of risk associated with falls and verbalized understanding to call for assistance before getting out of bed. Call light within reach. Patient/family able to  voice and demonstrate understanding of unit orientation instructions.    Will continue to evaluate and treat per MD orders.

## 2020-07-11 NOTE — Progress Notes (Signed)
Patient signed out AMA

## 2020-07-11 NOTE — Progress Notes (Signed)
Patient signed out AMA. States she had something that required her attention and could not wait until the morning. AMA paperwork placed on chart.

## 2020-07-11 NOTE — Progress Notes (Signed)
[facility]  PROGRESS NOTE  Kathy Lewis NOI:[id number] DOB: 1996/05/14 DOA: 07/10/2020 PCP: [facility], [place] date - 07/10/2020   Admitting Physician Shela Leff, MD  Outpatient Primary MD for the patient is [facility]  LOS - 1 Brief Narrative  Ms. 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  Hospital work-up chest x-ray was concerning for pulmonary edema, Covid test negative. Patient started on IV Lasix to assist in diuresis  Subjective  Today reports no shortness of breath while lying down overnight, still feels her belly is significantly swollen, and dyspnea with minimal exertion.  A & P    Acute on chronic CHF with reduced EF exacerbation. (EF 25-30% from TTE on 06/23/2020 and Care Everywhere). PND improved, muscle swelling and abdominal area, still has elevated JVD consistent with volume overload. So far only 40 cc out -Home regimen of Lasix 20 mg twice daily, currently on IV Lasix 20 mg twice daily -Continue home spironolactone -Closely monitor output, daily weights (goal dry weight of 127 pounds -Change to low-sodium, fluid restriction diet -monitor on telemetry -Closely monitor kidney function  CKD stage III. Seems for months recent creatinine baseline. 2.1 on admission creatinine 2.3, today 2.6 today. Suspect AKI in the setting of CHF exacerbation. -Expect improvement with continued diuresis Continue to monitor -Avoid nephrotoxins -Monitor output  Type 1 diabetes with history of poor control. Reported prior A1c of 7 Care Everywhere  glucose was low in the 60s on admission, fasting glucose today 226. A1c here 8.5 -Patient has insulin pump, diabetes coordinator consulted to assist with this -In the meantime continue Levemir 10 units, NovoLog sliding scale with meals  HTN, not at goal. Likely in the setting of CHF exacerbation -Continue home lisinopril  Normocytic anemia, chronic. Hemoglobin stable at baseline of 8. No signs of bleeding -Monitor CBC  Depression, stable -Lexapro     Family Communication  : None  Code Status : Full  Disposition Plan  :  Patient is from home. Anticipated d/c date: 2 to 3 days. Barriers to d/c or necessity for inpatient status:  Needs continued IV Lasix given still volume overloaded on exam, close monitoring of kidney function Consults  : None  Procedures  : None  DVT Prophylaxis  : DC Lovenox given CKD, changed to heparin subcu  MDM: The below labs and imaging reports were reviewed and summarized above.  Medication management as above.  Lab Results  Component Value Date   PLT 151 07/11/2020    Diet :  Diet Order            Diet renal with fluid restriction Fluid restriction: 1200 mL Fluid; Room service appropriate? Yes; Fluid consistency: Thin  Diet effective now                  Inpatient Medications Scheduled Meds: . enoxaparin (LOVENOX) injection  30 mg Subcutaneous Q24H  . escitalopram  20 mg Oral Daily  . furosemide  20 mg Intravenous BID  . insulin aspart  0-9 Units Subcutaneous TID WC  . insulin detemir  10 Units Subcutaneous Daily  . lisinopril  10 mg Oral Daily  .  spironolactone  25 mg Oral Daily  . Vitamin D (Ergocalciferol)  50,000 Units Oral Q7 days   Continuous Infusions: PRN Meds:.acetaminophen **OR** acetaminophen, butalbital-acetaminophen-caffeine, prochlorperazine  Antibiotics  :   Anti-infectives (From admission, onward)   None       Objective   Vitals:   07/10/20 2359 07/11/20 0001 07/11/20 0600 07/11/20 0903  BP: (!) 139/103   (!) 143/100 118/85  Pulse: 92  87 87  Resp: 17  18 18   Temp: 99.2 F (37.3 C)  98.8 F (37.1 C) 98.6 F (37 C)  TempSrc: Oral  Oral Oral  SpO2: 99%  97% 97%  Weight:  63.3 kg    Height:  5' (1.524 m)      SpO2: 97 %  Wt Readings from Last 3 Encounters:  07/11/20 63.3 kg  06/27/20 62.1 kg  06/14/20 65.3 kg     Intake/Output Summary (Last 24 hours) at 07/11/2020 0934 Last data filed at 07/11/2020 0600 Gross per 24 hour  Intake 480 ml  Output 200 ml  Net 280 ml    Physical Exam:     Awake Alert, Oriented X 3, flat affect No new F.N deficits,  [place].AT, Normal respiratory effort on room air, CTAB RRR,No Gallops,Rubs or new Murmurs, JVD to earlobe +ve B.Sounds, Abd Soft, significantly distended, no fluid wave no tenderness, No rebound, guarding or rigidity. No Cyanosis, No new Rash or bruise     I have personally reviewed the following:   Data Reviewed:  CBC Recent Labs  Lab 07/10/20 1706 07/11/20 0142  WBC 3.9* 4.9  HGB 8.1* 8.6*  HCT 24.8* 26.2*  PLT 160 151  MCV 96.9 97.4  MCH 31.6 32.0  MCHC 32.7 32.8  RDW 13.9 13.7  LYMPHSABS 1.4  --   MONOABS 0.2  --   EOSABS 0.1  --   BASOSABS 0.0  --     Chemistries  Recent Labs  Lab 07/10/20 1706 07/11/20 0142  NA 137 140  K 3.9 3.9  CL 107 109  CO2 24 24  GLUCOSE 95 168*  BUN 28* 29*  CREATININE 2.37* 2.68*  CALCIUM 7.7* 8.1*  MG  --  1.6*  AST 14*  --   ALT 11  --   ALKPHOS 69  --   BILITOT 0.5  --    ------------------------------------------------------------------------------------------------------------------ No results for input(s): CHOL, HDL, LDLCALC, TRIG, CHOLHDL, LDLDIRECT in the last 72 hours.  Lab Results  Component Value Date   HGBA1C 8.5 (H) 07/11/2020   ------------------------------------------------------------------------------------------------------------------ No results for input(s): TSH, T4TOTAL, T3FREE, THYROIDAB in the last 72 hours.  Invalid input(s):  FREET3 ------------------------------------------------------------------------------------------------------------------ No results for input(s): VITAMINB12, FOLATE, FERRITIN, TIBC, IRON, RETICCTPCT in the last 72 hours.  Coagulation profile No results for input(s): INR, PROTIME in the last 168 hours.  No results for input(s): DDIMER in the last 72 hours.  Cardiac Enzymes No results for input(s): CKMB, TROPONINI, MYOGLOBIN in the last 168 hours.  Invalid input(s): CK ------------------------------------------------------------------------------------------------------------------    Component Value Date/Time   BNP 1,190.0 (H) 07/10/2020 1706    Micro Results Recent Results (from the past 240 hour(s))  Respiratory Panel by RT PCR (Flu A&B, Covid) - Nasopharyngeal Swab     Status: None   Collection Time: 07/10/20  6:29 PM   Specimen: Nasopharyngeal Swab  Result Value Ref Range Status   SARS Coronavirus 2 by RT PCR NEGATIVE NEGATIVE Final    Comment: (NOTE) SARS-CoV-2 target nucleic acids are NOT DETECTED.  The SARS-CoV-2 RNA is  generally detectable in upper respiratoy specimens during the acute phase of infection. The lowest concentration of SARS-CoV-2 viral copies this assay can detect is 131 copies/mL. A negative result does not preclude SARS-Cov-2 infection and should not be used as the sole basis for treatment or other patient management decisions. A negative result may occur with  improper specimen collection/handling, submission of specimen other than nasopharyngeal swab, presence of viral mutation(s) within the areas targeted by this assay, and inadequate number of viral copies (<131 copies/mL). A negative result must be combined with clinical observations, patient history, and epidemiological information. The expected result is Negative.  Fact Sheet for Patients:  PinkCheek.be  Fact Sheet for Healthcare Providers:   GravelBags.it  This test is no t yet approved or cleared by the [place] FDA and  has been authorized for detection and/or diagnosis of SARS-CoV-2 by FDA under an Emergency Use Authorization (EUA). This EUA will remain  in effect (meaning this test can be used) for the duration of the COVID-19 declaration under Section 564(b)(1) of the Act, 21 U.S.C. section 360bbb-3(b)(1), unless the authorization is terminated or revoked sooner.     Influenza A by PCR NEGATIVE NEGATIVE Final   Influenza B by PCR NEGATIVE NEGATIVE Final    Comment: (NOTE) The Xpert Xpress SARS-CoV-2/FLU/RSV assay is intended as an aid in  the diagnosis of influenza from Nasopharyngeal swab specimens and  should not be used as a sole basis for treatment. Nasal washings and  aspirates are unacceptable for Xpert Xpress SARS-CoV-2/FLU/RSV  testing.  Fact Sheet for Patients: PinkCheek.be  Fact Sheet for Healthcare Providers: GravelBags.it  This test is not yet approved or cleared by the [place] FDA and  has been authorized for detection and/or diagnosis of SARS-CoV-2 by  FDA under an Emergency Use Authorization (EUA). This EUA will remain  in effect (meaning this test can be used) for the duration of the  Covid-19 declaration under Section 564(b)(1) of the Act, 21  U.S.C. section 360bbb-3(b)(1), unless the authorization is  terminated or revoked. Performed at [facility], [address] [place], [place] [address]     Radiology Reports DG Chest 2 View  Result Date: 07/10/2020 CLINICAL DATA:  Shortness of breath EXAM: CHEST - 2 VIEW COMPARISON:  June 27, 2020 FINDINGS: The cardiomediastinal silhouette is unchanged and enlarged in contour.Perihilar vascular fullness. No pleural effusion. No pneumothorax. Peribronchial cuffing. Bibasilar interstitial prominence. Visualized abdomen is unremarkable. No  acute osseous abnormality. IMPRESSION: Constellation of findings are suggestive of cardiomegaly with pulmonary edema. Electronically Signed   By: Valentino Saxon MD   On: 07/10/2020 17:16   DG Chest 2 View  Result Date: 06/27/2020 CLINICAL DATA:  Rapid weight gain overnight with suspected volume overload. EXAM: CHEST - 2 VIEW COMPARISON:  June 14, 2020 FINDINGS: Mild diffusely increased interstitial lung markings are seen. Mild areas of atelectasis and/or infiltrate are noted within the bilateral lung bases. There is no evidence of a pleural effusion or pneumothorax. The cardiac silhouette is moderately enlarged and unchanged in size. The visualized skeletal structures are unremarkable. IMPRESSION: 1. Stable cardiomegaly with mild interstitial edema. 2. Mild bibasilar atelectasis and/or infiltrate. Electronically Signed   By: Virgina Norfolk M.D.   On: 06/27/2020 20:19   DG Chest Port 1 View  Result Date: 06/14/2020 CLINICAL DATA:  Shortness of breath since yesterday. History of CHF. EXAM: PORTABLE CHEST 1 VIEW COMPARISON:  04/14/2020 FINDINGS: Cardiac enlargement with pulmonary vascular congestion and perihilar and basilar infiltration, likely  edema. No definite pleural effusions. No pneumothorax. Mediastinal contours appear intact. Progression of pulmonary changes since previous study. IMPRESSION: Cardiac enlargement with pulmonary vascular congestion and perihilar and basilar edema. Electronically Signed   By: Lucienne Capers M.D.   On: 06/14/2020 01:15     Time Spent in minutes  30     Desiree Hane M.D on 07/11/2020 at 9:34 AM  To page go to [email or web address] - password [email or web address]

## 2020-07-11 NOTE — Progress Notes (Addendum)
Checked patient's CBG with glucometer but the data didn't transfer. Was 249

## 2020-07-11 NOTE — Plan of Care (Signed)
?  Problem: Education: ?Goal: Ability to verbalize understanding of medication therapies will improve ?Outcome: Progressing ?  ?Problem: Activity: ?Goal: Capacity to carry out activities will improve ?Outcome: Progressing ?  ?Problem: Cardiac: ?Goal: Ability to achieve and maintain adequate cardiopulmonary perfusion will improve ?Outcome: Progressing ?  ?

## 2020-07-11 NOTE — H&P (Signed)
History and Physical    Kathy Lewis LFY:[id number] DOB: Dec 25, 1995 DOA: 07/10/2020  PCP: Medicine, [facility]  Patient coming from: Home from [facility].  I have personally briefly reviewed patient's old medical records in [place]  Chief Complaint: Shortness of breath that wakes her up from sleep.  HPI: Kathy Lewis is a 24 y.o. female with medical history significant of anxiety, depression, history of back spasm, stage III CKD, type 1 diabetes history of DKA, HSV infection, history of hypokalemia, leukocytosis, preeclampsia, severe anemia, chronic systolic heart failure with a recent EF of 20 to 25% last month at her cardiologist office who is referred from [place] for admission to this facility where she presented with complaints of progressively worse dyspnea in the past 2 days, orthopnea, PND, nonproductive cough, mild lower extremities and moderate fluid retention around the abdomen area.  She denies salt intake or oral fluids indiscretion. She denies fever, chills, rhinorrhea, sore throat, wheezing or hemoptysis.  No chest pain, palpitations, dizziness or diaphoresis. She denies abdominal pain, nausea or vomiting, diarrhea, constipation, melena or hematochezia. No flank pain, dysuria, frequency or hematuria.  She has a history of proteinuria and follows with nephrology. No polyuria, polydipsia, polyphagia or blurred vision.  ED Course: Initial vital signs were temperature 98.5 F, pulse 90, respirations 16, blood pressure 138/108 mmHg O2 sat 100% on room air.  The patient received supplemental oxygen and 20 mg of furosemide IV.  She desaturated to the low to mid 80s while on a trial of ambulation while in the ED.  Labs: CBC showed a white count 3.9 with a normal differential, hemoglobin 8.1 g/dL platelets 160.  CMP has normal electrolytes when calcium is corrected to albumin.  BUN was 28, creatinine 2.37 mg/dL.  Glucose was normal.  Total protein was  5.4 and albumin 2.3 g/dL.  The rest of the hepatic functions are unremarkable.  Troponin x2 was 7 ng/L.  BNP was 1119.0 pg/mL.  EKG showed diffuse borderline to abnormalities with borderline prolonged QT interval.  Imaging: 1 view chest radiograph shows cardiomegaly with pulmonary edema.  Please see image and full radiology report for further detail.  Review of Systems: As per HPI otherwise all other systems reviewed and are negative.  Past Medical History:  Diagnosis Date  . Anxiety   . Back spasm   . CKD (chronic kidney disease), stage III ([place])   . Diabetes mellitus without complication (HCC)    Type 1  . DKA (diabetic ketoacidoses)   . HSV infection    on valtrex  . Hypokalemia   . Leukocytosis   . Noncompliance with medication regimen   . Preeclampsia   . Severe anemia    Past Surgical History:  Procedure Laterality Date  . DILATION AND EVACUATION N/A 10/22/2019   Procedure: ULTRASOUND GUIDED DILATATION AND EVACUATION;  Surgeon: [name], MD;  Location: [facility] LD [facility]S;  Service: Gynecology;  Laterality: N/A;  . NO PAST SURGERIES     Social History  reports that she has quit smoking. She has never used smokeless tobacco. She reports that she does not drink alcohol and does not use drugs.  Allergies  Allergen Reactions  . Cantaloupe Extract Allergy Skin Test Itching    Mouth itching    . Nsaids     Avoid per nephrology   Family History  Adopted: Yes  Family history unknown: Yes   Prior to Admission medications   Medication Sig Start Date End Date Taking? Authorizing  Provider  acetaminophen (TYLENOL) 325 MG tablet Take 2 tablets (650 mg total) by mouth every 4 (four) hours as needed (for pain scale < 4). 10/27/19   [name], MD  acetaminophen (TYLENOL) 500 MG tablet Take 500 mg by mouth every 6 (six) hours as needed for mild pain.    [provider]  albuterol (VENTOLIN HFA) 108 (90 Base) MCG/ACT inhaler Inhale into the lungs. 01/15/20 01/14/21   [provider]  albuterol (VENTOLIN HFA) 108 (90 Base) MCG/ACT inhaler Inhale into the lungs. 01/15/20   [provider]  butalbital-acetaminophen-caffeine (FIORICET) 50-325-40 MG tablet Take by mouth. 02/15/20   [provider]  carvedilol (COREG) 3.125 MG tablet Take by mouth. 04/13/20   [provider]  escitalopram (LEXAPRO) 10 MG tablet Take 10 mg by mouth daily. 05/10/20   [provider]  escitalopram (LEXAPRO) 5 MG tablet Take 3 tablets (15 mg total) by mouth daily. Patient not taking: Reported on 10/30/2019 10/28/19   [name], MD  furosemide (LASIX) 20 MG tablet Take by mouth. 04/20/20   [provider]  furosemide (LASIX) 40 MG tablet Take 1 tablet (40 mg total) by mouth daily. 10/28/19   [name], MD  insulin aspart (NOVOLOG) 100 UNIT/ML injection Inject 2 Units into the skin 3 (three) times daily with meals. 10/30/19   Christophe Louis, MD  insulin aspart (NOVOLOG) 100 UNIT/ML injection Inject into the skin. 10/30/19   [provider]  insulin detemir (LEVEMIR) 100 UNIT/ML injection Inject 0.08 mLs (8 Units total) into the skin daily. 10/31/19   Christophe Louis, MD  insulin detemir (LEVEMIR) 100 UNIT/ML injection Inject into the skin. 10/31/19   [provider]  Insulin Syringe-Needle U-100 (INSULIN SYRINGE .3CC/31GX5/16") 31G X 5/16" 0.3 ML MISC Use as directed 10/27/19   [name], MD  lisinopril (ZESTRIL) 10 MG tablet Take by mouth. 02/23/20   [provider]  NIFEdipine (ADALAT CC) 60 MG 24 hr tablet Take 1 tablet (60 mg total) by mouth daily. 10/28/19   [name], MD  ondansetron (ZOFRAN) 4 MG tablet Take 1 tablet (4 mg total) by mouth every 8 (eight) hours as needed for nausea or vomiting. Patient not taking: Reported on 10/30/2019 06/17/19   Tamala Julian, [place], CNM  oxyCODONE (OXY IR/ROXICODONE) 5 MG immediate release tablet Take 1 tablet (5 mg total) by mouth every 4 (four) hours as needed (pain scale  4-7). Patient not taking: Reported on 10/30/2019 10/27/19   [name], MD  potassium chloride (KLOR-CON) 10 MEQ tablet Take 1 tablet (10 mEq total) by mouth daily. 10/27/19   [name], MD  Prenatal Vit-Fe Fumarate-FA (PRENATAL MULTIVITAMIN) TABS tablet Take 1 tablet by mouth at bedtime.    [provider]  spironolactone (ALDACTONE) 25 MG tablet Take by mouth. 04/20/20   [provider]  Vitamin D, Ergocalciferol, (DRISDOL) 1.25 MG (50000 UNIT) CAPS capsule Take 50,000 Units by mouth every 7 (seven) days.    [provider]   Physical Exam: Vitals:   07/10/20 2200 07/10/20 2230 07/10/20 2300 07/10/20 2359  BP: (!) 134/100 (!) 134/105 (!) 142/107 (!) 139/103  Pulse: 86 87 93 92  Resp: 19 20  17   Temp:    99.2 F (37.3 C)  TempSrc:    Oral  SpO2: 97% 100% 100% 99%  Weight:      Height:       Constitutional: NAD, calm, comfortable Eyes: PERRL, lids and conjunctivae are mildly pale. ENMT: Mucous membranes are moist. Posterior  pharynx clear of any exudate or lesions. Neck: normal, supple, no masses, no thyromegaly Respiratory: No wheezing, bibasilar crackles. Normal respiratory effort. No accessory muscle use.  Cardiovascular: Regular rate and rhythm, no murmurs / rubs / gallops.  Trace bilateral lower extremity pitting edema. 2+ pedal pulses. No carotid bruits.  Abdomen: Nondistended. Bowel sounds positive. Soft, no tenderness, no masses palpated. No hepatosplenomegaly. Musculoskeletal: no clubbing / cyanosis. Good ROM, no contractures. Normal muscle tone.  Skin: no rashes, lesions, ulcers on limited dermatological examination. Neurologic: CN 2-12 grossly intact. Sensation intact, DTR normal. Strength 5/5 in all 4.  Psychiatric: Normal judgment and insight. Alert and oriented x 3. Normal mood.   Labs on Admission: I have personally reviewed following labs and imaging studies  CBC: Recent Labs  Lab 07/10/20 1706  WBC 3.9*  NEUTROABS 2.2  HGB 8.1*   HCT 24.8*  MCV 96.9  PLT 542    Basic Metabolic Panel: Recent Labs  Lab 07/10/20 1706  NA 137  K 3.9  CL 107  CO2 24  GLUCOSE 95  BUN 28*  CREATININE 2.37*  CALCIUM 7.7*    GFR: Estimated Creatinine Clearance: 29.9 mL/min (A) (by C-G formula based on SCr of 2.37 mg/dL (H)).  Liver Function Tests: Recent Labs  Lab 07/10/20 1706  AST 14*  ALT 11  ALKPHOS 69  BILITOT 0.5  PROT 5.4*  ALBUMIN 2.3*   Radiological Exams on Admission: DG Chest 2 View  Result Date: 07/10/2020 CLINICAL DATA:  Shortness of breath EXAM: CHEST - 2 VIEW COMPARISON:  June 27, 2020 FINDINGS: The cardiomediastinal silhouette is unchanged and enlarged in contour.Perihilar vascular fullness. No pleural effusion. No pneumothorax. Peribronchial cuffing. Bibasilar interstitial prominence. Visualized abdomen is unremarkable. No acute osseous abnormality. IMPRESSION: Constellation of findings are suggestive of cardiomegaly with pulmonary edema. Electronically Signed   By: Valentino Saxon MD   On: 07/10/2020 17:16   10/22/2019 echocardiogram complete  IMPRESSIONS: 1. Left ventricular ejection fraction, by estimation, is 55 to 60%. The  left ventricle has normal function. The left ventricle has no regional  wall motion abnormalities. There is mild concentric left ventricular  hypertrophy. Indeterminate diastolic  filling due to E-A fusion.  2. Right ventricular systolic function is normal. The right ventricular  size is normal. Tricuspid regurgitation signal is inadequate for assessing  PA pressure.  3. Moderate pericardial effusion. The pericardial effusion is  circumferential. There is no evidence of cardiac tamponade.  4. The mitral valve is grossly normal. No evidence of mitral valve  regurgitation.  5. The aortic valve is tricuspid. Aortic valve regurgitation is not  visualized. No aortic stenosis is present.  6. The inferior vena cava is normal in size with greater than 50%   respiratory variability, suggesting right atrial pressure of 3 mmHg.  10/24/2019 echocardiogram limited IMPRESSIONS:  1. Left ventricular ejection fraction, by estimation, is 60 to 65%. The  left ventricle has normal function. The left ventricle has no regional  wall motion abnormalities.  2. Right ventricular systolic function is normal. The right ventricular  size is normal. There is normal pulmonary artery systolic pressure.  3. No change in size of effusion sicne 10/22/19 There appears to be  variation in mitral inflow with respiration but IVC collapses and is not  very dilated suggesting no tamponade . Moderate pericardial effusion. The  pericardial effusion is posterior to  the left ventricle and posterior to the left ventricle and the left  atrium.  4. The mitral valve is normal in  structure and function. Trivial mitral  valve regurgitation. No evidence of mitral stenosis.  5. The aortic valve is normal in structure and function. Aortic valve  regurgitation is not visualized. No aortic stenosis is present.  6. The inferior vena cava is normal in size with greater than 50%  respiratory variability, suggesting right atrial pressure of 3 mmHg.   EKG: Independently reviewed. Vent. rate 89 BPM PR interval * ms QRS duration 99 ms QT/QTc 399/486 ms P-R-T axes 44 52 -56 Sinus rhythm Borderline T abnormalities, diffuse leads Borderline prolonged QT interval  Assessment/Plan Principal Problem:   Acute on chronic systolic CHF (congestive heart failure) (HCC) Observation/cardiac telemetry. Continue supplemental oxygen. Continue furosemide 20 mg IVP twice daily. Resume ACE inhibitor in AM. Continue spironolactone 25 mg p.o. daily. Hold carvedilol due to acute decompensation. Monitor daily weights, renal function electrolytes. EF 20 to 25% on echocardiogram done last month.  Active Problems:   Type 1 diabetes mellitus with ketoacidosis, uncontrolled (HCC) Carbohydrate  modified diet. Continue Levemir 8 units SQ daily. CBG monitoring with R ISS.    CKD (chronic kidney disease), stage III (HCC) Monitor renal function electrolytes.    Hypoalbuminemia Proteinuria?  Malnutrition? Consult nutritional services.    Anxiety Continue escitalopram 10 mg p.o. daily.    Normocytic anemia Monitor H&H. Transfuse as needed. Erythropoietin if needed, per nephrology.    DVT prophylaxis: Lovenox SQ. Code Status:   Full code. Family Communication: Disposition Plan:   Patient is from:  Home.  Anticipated DC to:  Home.  Anticipated DC date:  07/12/2020.  Anticipated DC barriers: Clinical status.  Consults called: Admission status:  Observation/telemetry.   Severity of Illness: This document was prepared using Dragon voice recognition software and may contain some unintended transcription errors.  Reubin Milan MD [facility]  How to contact the [facility] Attending or Consulting provider [place] or covering provider during after hours [place], for this patient?   1. Check the care team in [facility] and look for a) attending/consulting TRH provider listed and b) the [facility] team listed 2. Log into [email or web address] and use [place]'s universal password to access. If you do not have the password, please contact the hospital operator. 3. Locate the [facility] provider you are looking for under [facility] and page to a number that you can be directly reached. 4. If you still have difficulty reaching the provider, please page the [facility] (Director on Call) for the Hospitalists listed on amion for assistance.  07/11/2020, 12:24 AM   This document was prepared using Paramedic and may contain some unintended transcription errors.

## 2020-07-14 NOTE — Discharge Summary (Signed)
Faige Seely UCJ:[id number] DOB: [birth year] DOA: 07/10/2020  PCP: Medicine, [place] date: 07/10/2020 Discharge date: [place] Admitted From: home   Brief/Interim Summary: History of present illness:  Ms. Lizana is a 24 year old female with medical history significant for postpartum cardiomyopathy with a EF of 25 (TTE from 06/23/2020 in care everywhere), type 1 diabetes with history of poor control, CKD stage III, HTN who presents with days of worsening dyspnea on exertion, increased abdominal swelling and paroxysmal nocturnal dyspnea and was admitted with working diagnosis of acute on chronic systolic CHF exacerbation.  Hospital work-up chest x-ray was concerning for pulmonary edema, Covid test negative. Patient started on IV Lasix to assist in diuresis. Patient left against medical advice after 1st hospital day  SIGNED:   Desiree Hane, MD  [facility] 07/14/2020, 12:02 AM Pager   If 7PM-7AM, please contact night-coverage [email or web address] Password [email or web address]

## 2020-07-21 ENCOUNTER — Other Ambulatory Visit: Payer: Self-pay | Admitting: Advanced Practice Midwife

## 2020-09-05 ENCOUNTER — Encounter (HOSPITAL_COMMUNITY): Payer: Self-pay | Admitting: *Deleted

## 2020-09-05 ENCOUNTER — Emergency Department (HOSPITAL_COMMUNITY)
Admission: EM | Admit: 2020-09-05 | Discharge: 2020-09-05 | Disposition: A | Discharge status: Home / Self Care | Payer: Medicaid Other | Attending: Emergency Medicine | Admitting: Emergency Medicine

## 2020-09-05 DIAGNOSIS — Z20822 Contact with and (suspected) exposure to covid-19: Secondary | ICD-10-CM

## 2020-09-05 DIAGNOSIS — I5023 Acute on chronic systolic (congestive) heart failure: Secondary | ICD-10-CM | POA: Diagnosis not present

## 2020-09-05 DIAGNOSIS — U071 COVID-19: Secondary | ICD-10-CM | POA: Diagnosis not present

## 2020-09-05 DIAGNOSIS — N183 Chronic kidney disease, stage 3 unspecified: Secondary | ICD-10-CM | POA: Diagnosis not present

## 2020-09-05 DIAGNOSIS — Z87891 Personal history of nicotine dependence: Secondary | ICD-10-CM | POA: Insufficient documentation

## 2020-09-05 DIAGNOSIS — E1022 Type 1 diabetes mellitus with diabetic chronic kidney disease: Secondary | ICD-10-CM | POA: Diagnosis not present

## 2020-09-05 DIAGNOSIS — Z79899 Other long term (current) drug therapy: Secondary | ICD-10-CM | POA: Diagnosis not present

## 2020-09-05 DIAGNOSIS — Z794 Long term (current) use of insulin: Secondary | ICD-10-CM | POA: Diagnosis not present

## 2020-09-05 DIAGNOSIS — J029 Acute pharyngitis, unspecified: Secondary | ICD-10-CM | POA: Diagnosis present

## 2020-09-05 DIAGNOSIS — R509 Fever, unspecified: Secondary | ICD-10-CM

## 2020-09-05 HISTORY — DX: Heart failure, unspecified: I50.9

## 2020-09-05 NOTE — ED Triage Notes (Signed)
Pt exposed to [place] on Wednesday.  She started with sore throat yesterday.  No fevers.

## 2020-09-05 NOTE — ED Provider Notes (Signed)
[facility] EMERGENCY DEPARTMENT Provider Note   CSN: [id number] Arrival date & time: 09/05/20  1421     History Chief Complaint  Patient presents with  . Covid Exposure    Kathy Lewis is a 25 y.o. female.  Exposure to COVID, mild sore throat otherwise no symptoms.  Up-to-date with routine vaccines, did not get COVID-vaccine.        Past Medical History:  Diagnosis Date  . Anxiety   . Back spasm   . CHF (congestive heart failure) ([place])   . CKD (chronic kidney disease), stage III ([place])   . Diabetes mellitus without complication (HCC)    Type 1  . DKA (diabetic ketoacidoses)   . HSV infection    on valtrex  . Hypokalemia   . Leukocytosis   . Noncompliance with medication regimen   . Preeclampsia   . Severe anemia     Patient Active Problem List   Diagnosis Date Noted  . Acute on chronic systolic CHF (congestive heart failure) ([place]) 07/11/2020  . CKD (chronic kidney disease), stage III ([place])   . Hypoalbuminemia   . Anxiety   . Normocytic anemia   . Borderline prolonged QT interval   . CHF exacerbation ([place]) 07/10/2020  . Fever 10/28/2019  . Preeclampsia 10/15/2019  . Severe anemia 10/13/2019  . Pre-existing type 1 diabetes mellitus with hyperglycemia during pregnancy in first trimester ([place]) 04/30/2019  . Hypokalemia   . DKA (diabetic ketoacidoses) 12/18/2014  . Leukocytosis 12/18/2014  . Noncompliance with medications 12/18/2014  . Type 1 diabetes mellitus with ketoacidosis, uncontrolled ([place]) 12/18/2014  . Nausea and vomiting 12/18/2014    Past Surgical History:  Procedure Laterality Date  . CARDIAC CATHETERIZATION    . DILATION AND EVACUATION N/A 10/22/2019   Procedure: ULTRASOUND GUIDED DILATATION AND EVACUATION;  Surgeon: [name], MD;  Location: [facility] LD ORS;  Service: Gynecology;  Laterality: N/A;  . NO PAST SURGERIES    . RENAL BIOPSY       OB History    Gravida  2   Para  1   Term      Preterm  1   AB  1    Living  1     SAB  1   IAB      Ectopic      Multiple  0   Live Births  1           Family History  Adopted: Yes  Family history unknown: Yes    Social History   Tobacco Use  . Smoking status: Former Research scientist (life sciences)  . Smokeless tobacco: Never Used  Vaping Use  . Vaping Use: Never used  Substance Use Topics  . Alcohol use: No  . Drug use: No    Home Medications Prior to Admission medications   Medication Sig Start Date End Date Taking? Authorizing Provider  acetaminophen (TYLENOL) 325 MG tablet Take 2 tablets (650 mg total) by mouth every 4 (four) hours as needed (for pain scale < 4). Patient not taking: Reported on 07/11/2020 10/27/19   [name], MD  acetaminophen (TYLENOL) 500 MG tablet Take 500 mg by mouth every 6 (six) hours as needed for mild pain.    [provider]  albuterol (VENTOLIN HFA) 108 (90 Base) MCG/ACT inhaler Inhale 2 puffs into the lungs every 6 (six) hours as needed for wheezing or shortness of breath.  01/15/20 01/14/21  [provider]  butalbital-acetaminophen-caffeine (FIORICET) 50-325-40 MG tablet Take 1 tablet by  mouth every 6 (six) hours as needed for headache.  02/15/20   [provider]  carvedilol (COREG) 3.125 MG tablet Take 6.25 mg by mouth 2 (two) times daily with a meal.  04/13/20   [provider]  escitalopram (LEXAPRO) 10 MG tablet Take 20 mg by mouth daily.  05/10/20   [provider]  furosemide (LASIX) 40 MG tablet Take 1 tablet (40 mg total) by mouth daily. Patient taking differently: Take 20 mg by mouth 2 (two) times daily.  10/28/19   [name], MD  insulin aspart (NOVOLOG) 100 UNIT/ML injection Inject 2 Units into the skin 3 (three) times daily with meals. Patient taking differently: Inject 2-8 Units into the skin 3 (three) times daily with meals. Per sliding 10/30/19   Christophe Louis, MD  insulin detemir (LEVEMIR) 100 UNIT/ML injection Inject 0.08 mLs (8 Units total) into the skin  daily. Patient taking differently: Inject 10 Units into the skin daily.  10/31/19   Christophe Louis, MD  Insulin Syringe-Needle U-100 (INSULIN SYRINGE .3CC/31GX5/16") 31G X 5/16" 0.3 ML MISC Use as directed 10/27/19   [name], MD  lisinopril (ZESTRIL) 10 MG tablet Take 10 mg by mouth daily.  02/23/20   [provider]  potassium chloride (KLOR-CON) 10 MEQ tablet Take 1 tablet (10 mEq total) by mouth daily. 10/27/19   [name], MD  spironolactone (ALDACTONE) 25 MG tablet Take 25 mg by mouth daily.  04/20/20   [provider]  Vitamin D, Ergocalciferol, (DRISDOL) 1.25 MG (50000 UNIT) CAPS capsule Take 50,000 Units by mouth every 7 (seven) days.    [provider]    Allergies    Cantaloupe extract allergy skin test and Nsaids  Review of Systems   Review of Systems  Constitutional: Negative for chills and fever.  HENT: Positive for sore throat. Negative for congestion and rhinorrhea.   Respiratory: Negative for cough and shortness of breath.   Cardiovascular: Negative for chest pain and palpitations.  Gastrointestinal: Negative for diarrhea, nausea and vomiting.  Genitourinary: Negative for difficulty urinating and dysuria.  Musculoskeletal: Negative for arthralgias and back pain.  Skin: Negative for rash and wound.  Neurological: Negative for light-headedness and headaches.    Physical Exam Updated Vital Signs BP (!) 150/100   Pulse 90   Temp 98.8 F (37.1 C) (Oral)   Resp 20   SpO2 99%   Physical Exam Vitals and nursing note reviewed. Exam conducted with a chaperone present.  Constitutional:      General: She is not in acute distress.    Appearance: Normal appearance.  HENT:     Head: Normocephalic and atraumatic.     Nose: No rhinorrhea.     Mouth/Throat:     Mouth: Mucous membranes are moist.     Pharynx: Oropharynx is clear. No oropharyngeal exudate or posterior oropharyngeal erythema.  Eyes:     General:        Right eye: No discharge.         Left eye: No discharge.     Conjunctiva/sclera: Conjunctivae normal.  Cardiovascular:     Rate and Rhythm: Normal rate and regular rhythm.  Pulmonary:     Effort: Pulmonary effort is normal. No respiratory distress.     Breath sounds: No stridor.  Abdominal:     General: Abdomen is flat. There is no distension.     Palpations: Abdomen is soft.  Musculoskeletal:        General: No tenderness or signs of injury.  Skin:    General: Skin is warm and dry.     Capillary Refill: Capillary refill takes less than 2 seconds.  Neurological:     General: No focal deficit present.     Mental Status: She is alert. Mental status is at baseline.     Motor: No weakness.  Psychiatric:        Mood and Affect: Mood normal.        Behavior: Behavior normal.     ED Results / Procedures / Treatments   Labs (all labs ordered are listed, but only abnormal results are displayed) Labs Reviewed - No data to display  EKG None  Radiology No results found.  Procedures Procedures (including critical care time)  Medications Ordered in ED Medications - No data to display  ED Course  I have reviewed the triage vital signs and the nursing notes.  Pertinent labs & imaging results that were available during my care of the patient were reviewed by me and considered in my medical decision making (see chart for details).    MDM Rules/Calculators/A&P                          covid exposure, whole family presenting for testing.  Overall well-appearing, vital signs stable afebrile.  Normal work of breathing well-hydrated tolerating p.o.  COVID test will not be performed today as her multiple family members with COVID, her symptoms are likely that of COVID.  She has minimal to no symptoms.  We will refer her to the testing facilities to get COVID testing done as needed.  Patient agrees to this plan strict return precautions given.   Final Clinical Impression(s) / ED Diagnoses Final diagnoses:  Fever  in adult  Close exposure to COVID-19 virus    Rx / DC Orders ED Discharge Orders    None       Breck Coons, MD 09/08/20 1426

## 2020-09-05 NOTE — Discharge Instructions (Signed)
Current quarantine recommendations are 5 days of complete isolation followed by 5 days of mask wearing around close family members and after quarantine is completely back to normal day-to-day wearing a mask in the community.  He also need to have at least 24 hours of no fever and resolving symptoms prior to coming out of isolation.

## 2020-09-07 ENCOUNTER — Other Ambulatory Visit: Payer: Medicaid Other

## 2020-09-07 DIAGNOSIS — Z20822 Contact with and (suspected) exposure to covid-19: Secondary | ICD-10-CM

## 2020-09-09 ENCOUNTER — Other Ambulatory Visit: Payer: Medicaid Other

## 2020-09-09 LAB — SARS-COV-2, NAA 2 DAY TAT

## 2020-09-09 LAB — NOVEL CORONAVIRUS, NAA: SARS-CoV-2, NAA: DETECTED — AB

## 2020-09-24 ENCOUNTER — Ambulatory Visit (HOSPITAL_COMMUNITY)
Admission: EM | Admit: 2020-09-24 | Discharge: 2020-09-24 | Disposition: A | Discharge status: Home / Self Care | Payer: Medicaid Other | Attending: Medical Oncology | Admitting: Medical Oncology

## 2020-09-24 ENCOUNTER — Encounter (HOSPITAL_COMMUNITY): Payer: Self-pay

## 2020-09-24 ENCOUNTER — Other Ambulatory Visit: Payer: Self-pay

## 2020-09-24 DIAGNOSIS — K0889 Other specified disorders of teeth and supporting structures: Secondary | ICD-10-CM | POA: Diagnosis not present

## 2020-09-24 DIAGNOSIS — Z3202 Encounter for pregnancy test, result negative: Secondary | ICD-10-CM

## 2020-09-24 LAB — POC URINE PREG, ED: Preg Test, Ur: NEGATIVE

## 2020-09-24 MED ORDER — HYDROCODONE-ACETAMINOPHEN 5-325 MG PO TABS
1.0000 | ORAL_TABLET | Freq: Four times a day (QID) | ORAL | 0 refills | Status: AC | PRN
Start: 2020-09-24 — End: 2020-09-29

## 2020-09-24 MED ORDER — CLINDAMYCIN HCL 150 MG PO CAPS: 150.0000 mg | ORAL_CAPSULE | Freq: Four times a day (QID) | ORAL | 0 refills | Status: DC

## 2020-09-24 NOTE — ED Triage Notes (Signed)
Pt present jaw pain from a tooth extraction. Pt states she cannot chew or eat any thing. The pain is so unbearable.

## 2020-09-24 NOTE — ED Provider Notes (Signed)
[place]    CSN: [id number] Arrival date & time: 09/24/20  1344      History   Chief Complaint Chief Complaint  Patient presents with  . Jaw Pain    Right side     HPI Kathy Lewis is a 25 y.o. female.   HPI   Jaw Pain: Pt states that she has had jaw pain for the past week.  She reports that she had dental surgery on last Friday to remove for wisdom teeth and 1 right upper molar.  She states that she believes the surgery went well but she was discharged home without further instructions per patient.  She states that she was not discharged with pain medication but was instead instructed to take Tylenol or ibuprofen.  She states that these medications have not helped with her pain.  She states that the pain on the left side of her jaw both upper and bottom as well as the right upper jaw feel much improved however the right lower jaw feels swollen and is painful.  Pain is rated 8 out of 10 in nature.  She is having difficulties eating due to the pain.  She denies any trouble breathing, swallowing, bleeding from the teeth or fever. Of note she does have type 1 diabetes which is uncontrolled.   Past Medical History:  Diagnosis Date  . Anxiety   . Back spasm   . CHF (congestive heart failure) ([place])   . CKD (chronic kidney disease), stage III ([place])   . Diabetes mellitus without complication (HCC)    Type 1  . DKA (diabetic ketoacidoses)   . HSV infection    on valtrex  . Hypokalemia   . Leukocytosis   . Noncompliance with medication regimen   . Preeclampsia   . Severe anemia     Patient Active Problem List   Diagnosis Date Noted  . Acute on chronic systolic CHF (congestive heart failure) ([place]) 07/11/2020  . CKD (chronic kidney disease), stage III ([place])   . Hypoalbuminemia   . Anxiety   . Normocytic anemia   . Borderline prolonged QT interval   . CHF exacerbation ([place]) 07/10/2020  . Fever 10/28/2019  . Preeclampsia 10/15/2019  . Severe anemia 10/13/2019   . Pre-existing type 1 diabetes mellitus with hyperglycemia during pregnancy in first trimester ([place]) 04/30/2019  . Hypokalemia   . DKA (diabetic ketoacidoses) 12/18/2014  . Leukocytosis 12/18/2014  . Noncompliance with medications 12/18/2014  . Type 1 diabetes mellitus with ketoacidosis, uncontrolled ([place]) 12/18/2014  . Nausea and vomiting 12/18/2014    Past Surgical History:  Procedure Laterality Date  . CARDIAC CATHETERIZATION    . DILATION AND EVACUATION N/A 10/22/2019   Procedure: ULTRASOUND GUIDED DILATATION AND EVACUATION;  Surgeon: [name], MD;  Location: [facility];  Service: Gynecology;  Laterality: N/A;  . NO PAST SURGERIES    . RENAL BIOPSY      OB History    Gravida  2   Para  1   Term      Preterm  1   AB  1   Living  1     SAB  1   IAB      Ectopic      Multiple  0   Live Births  1            Home Medications    Prior to Admission medications   Medication Sig Start Date End Date Taking? Authorizing Provider  acetaminophen (TYLENOL) 325  MG tablet Take 2 tablets (650 mg total) by mouth every 4 (four) hours as needed (for pain scale < 4). Patient not taking: Reported on 07/11/2020 10/27/19   [name], MD  acetaminophen (TYLENOL) 500 MG tablet Take 500 mg by mouth every 6 (six) hours as needed for mild pain.    [provider]  albuterol (VENTOLIN HFA) 108 (90 Base) MCG/ACT inhaler Inhale 2 puffs into the lungs every 6 (six) hours as needed for wheezing or shortness of breath.  01/15/20 01/14/21  [provider]  butalbital-acetaminophen-caffeine (FIORICET) 50-325-40 MG tablet Take 1 tablet by mouth every 6 (six) hours as needed for headache.  02/15/20   [provider]  carvedilol (COREG) 3.125 MG tablet Take 6.25 mg by mouth 2 (two) times daily with a meal.  04/13/20   [provider]  escitalopram (LEXAPRO) 10 MG tablet Take 20 mg by mouth daily.  05/10/20   [provider]  furosemide (LASIX)  40 MG tablet Take 1 tablet (40 mg total) by mouth daily. Patient taking differently: Take 20 mg by mouth 2 (two) times daily.  10/28/19   [name], MD  insulin aspart (NOVOLOG) 100 UNIT/ML injection Inject 2 Units into the skin 3 (three) times daily with meals. Patient taking differently: Inject 2-8 Units into the skin 3 (three) times daily with meals. Per sliding 10/30/19   Christophe Louis, MD  insulin detemir (LEVEMIR) 100 UNIT/ML injection Inject 0.08 mLs (8 Units total) into the skin daily. Patient taking differently: Inject 10 Units into the skin daily.  10/31/19   Christophe Louis, MD  Insulin Syringe-Needle U-100 (INSULIN SYRINGE .3CC/31GX5/16") 31G X 5/16" 0.3 ML MISC Use as directed 10/27/19   [name], MD  lisinopril (ZESTRIL) 10 MG tablet Take 10 mg by mouth daily.  02/23/20   [provider]  potassium chloride (KLOR-CON) 10 MEQ tablet Take 1 tablet (10 mEq total) by mouth daily. 10/27/19   [name], MD  spironolactone (ALDACTONE) 25 MG tablet Take 25 mg by mouth daily.  04/20/20   [provider]  Vitamin D, Ergocalciferol, (DRISDOL) 1.25 MG (50000 UNIT) CAPS capsule Take 50,000 Units by mouth every 7 (seven) days.    [provider]    Family History Family History  Adopted: Yes  Family history unknown: Yes    Social History Social History   Tobacco Use  . Smoking status: Former Research scientist (life sciences)  . Smokeless tobacco: Never Used  Vaping Use  . Vaping Use: Never used  Substance Use Topics  . Alcohol use: No  . Drug use: No     Allergies   Cantaloupe extract allergy skin test and Nsaids   Review of Systems Review of Systems   Physical Exam Triage Vital Signs ED Triage Vitals [09/24/20 1408]  Enc Vitals Group     BP (!) 140/98     Pulse Rate 88     Resp 16     Temp 98.5 F (36.9 C)     Temp Source Oral     SpO2 100 %     Weight      Height      Head Circumference      Peak Flow      Pain Score      Pain Loc      Pain Edu?      Excl.  in [place]?    No data found.  Updated Vital Signs BP (!) 140/98 (BP Location: Right Arm)   Pulse 88  Temp 98.5 F (36.9 C) (Oral)   Resp 16   SpO2 100%      Physical Exam Vitals and nursing note reviewed.  Constitutional:      General: She is not in acute distress.    Appearance: Normal appearance. She is not ill-appearing, toxic-appearing or diaphoretic.  HENT:     Head: Normocephalic.     Nose: No congestion or rhinorrhea.     Mouth/Throat:     Mouth: Mucous membranes are moist.     Pharynx: No oropharyngeal exudate or posterior oropharyngeal erythema.     Comments: Mallampati 2. NO evidence of swelling, tenderness or edema of the superior or inferior palate. There is mild edema of the right cheek compared to the left. No evidence of a dry socket. No bleeding, discharge or abnormality of excision sites.  Cardiovascular:     Rate and Rhythm: Normal rate and regular rhythm.  Pulmonary:     Effort: Pulmonary effort is normal.     Breath sounds: Normal breath sounds.  Musculoskeletal:     Cervical back: Neck supple.  Lymphadenopathy:     Cervical: No cervical adenopathy.  Neurological:     Mental Status: She is alert.      UC Treatments / Results  Labs (all labs ordered are listed, but only abnormal results are displayed) Labs Reviewed - No data to display  Radiology No results found.  Procedures Procedures (including critical care time)  Medications Ordered in UC Medications - No data to display  Initial Impression / Assessment and Plan / UC Course  I have reviewed the triage vital signs and the nursing notes.  Pertinent labs & imaging results that were available during my care of the patient were reviewed by me and considered in my medical decision making (see chart for details).     New.  I suspect that she has developed a secondary infection given her type 1 diabetes.  I have recommended that she start clindamycin and she asked for pain medication.  PMP  reviewed and I have given her 5 days worth of Norco.  We had a long discussion about how to use this medication along with common potential side effects, precautions, blackbox warning and risk for addiction.  We reviewed that she should not take this medication while driving, with alcohol or benzodiazepines.  She also is aware that she will need to contact her dentist as soon as she leaves our office to schedule a follow-up with them. Red flag symptoms reviewed.  I have also encouraged her to schedule follow-up with her PCP regarding her diabetes.  Final Clinical Impressions(s) / UC Diagnoses   Final diagnoses:  None   Discharge Instructions   None    ED Prescriptions    None     PDMP not reviewed this encounter.   Hughie Closs, [place] 09/24/20 1548

## 2020-10-06 ENCOUNTER — Other Ambulatory Visit: Payer: Self-pay

## 2020-10-06 ENCOUNTER — Encounter (HOSPITAL_BASED_OUTPATIENT_CLINIC_OR_DEPARTMENT_OTHER): Payer: Self-pay | Admitting: Emergency Medicine

## 2020-10-06 ENCOUNTER — Emergency Department (HOSPITAL_BASED_OUTPATIENT_CLINIC_OR_DEPARTMENT_OTHER)
Admission: EM | Admit: 2020-10-06 | Discharge: 2020-10-06 | Disposition: A | Discharge status: Home / Self Care | Payer: Medicaid Other | Attending: Emergency Medicine | Admitting: Emergency Medicine

## 2020-10-06 DIAGNOSIS — G43409 Hemiplegic migraine, not intractable, without status migrainosus: Secondary | ICD-10-CM | POA: Diagnosis not present

## 2020-10-06 DIAGNOSIS — R739 Hyperglycemia, unspecified: Secondary | ICD-10-CM

## 2020-10-06 DIAGNOSIS — I509 Heart failure, unspecified: Secondary | ICD-10-CM | POA: Diagnosis not present

## 2020-10-06 DIAGNOSIS — N189 Chronic kidney disease, unspecified: Secondary | ICD-10-CM

## 2020-10-06 DIAGNOSIS — E1065 Type 1 diabetes mellitus with hyperglycemia: Secondary | ICD-10-CM | POA: Insufficient documentation

## 2020-10-06 DIAGNOSIS — Z9861 Coronary angioplasty status: Secondary | ICD-10-CM | POA: Insufficient documentation

## 2020-10-06 DIAGNOSIS — E1022 Type 1 diabetes mellitus with diabetic chronic kidney disease: Secondary | ICD-10-CM | POA: Insufficient documentation

## 2020-10-06 DIAGNOSIS — F17290 Nicotine dependence, other tobacco product, uncomplicated: Secondary | ICD-10-CM | POA: Diagnosis not present

## 2020-10-06 DIAGNOSIS — E1010 Type 1 diabetes mellitus with ketoacidosis without coma: Secondary | ICD-10-CM | POA: Diagnosis not present

## 2020-10-06 DIAGNOSIS — R519 Headache, unspecified: Secondary | ICD-10-CM | POA: Diagnosis present

## 2020-10-06 DIAGNOSIS — N183 Chronic kidney disease, stage 3 unspecified: Secondary | ICD-10-CM | POA: Diagnosis not present

## 2020-10-06 DIAGNOSIS — D649 Anemia, unspecified: Secondary | ICD-10-CM | POA: Diagnosis not present

## 2020-10-06 HISTORY — DX: Type 1 diabetes mellitus without complications: E10.9

## 2020-10-06 HISTORY — DX: Bipolar II disorder: F31.81

## 2020-10-06 HISTORY — DX: Migraine, unspecified, not intractable, without status migrainosus: G43.909

## 2020-10-06 HISTORY — DX: Depression, unspecified: F32.A

## 2020-10-06 LAB — URINALYSIS, MICROSCOPIC (REFLEX)

## 2020-10-06 LAB — CBC WITH DIFFERENTIAL/PLATELET
Abs Immature Granulocytes: 0.01 10*3/uL (ref 0.00–0.07)
Basophils Absolute: 0 10*3/uL (ref 0.0–0.1)
Basophils Relative: 1 %
Eosinophils Absolute: 0 10*3/uL (ref 0.0–0.5)
Eosinophils Relative: 1 %
HCT: 24.9 % — ABNORMAL LOW (ref 36.0–46.0)
Hemoglobin: 8.4 g/dL — ABNORMAL LOW (ref 12.0–15.0)
Immature Granulocytes: 0 %
Lymphocytes Relative: 28 %
Lymphs Abs: 1.3 10*3/uL (ref 0.7–4.0)
MCH: 31.9 pg (ref 26.0–34.0)
MCHC: 33.7 g/dL (ref 30.0–36.0)
MCV: 94.7 fL (ref 80.0–100.0)
Monocytes Absolute: 0.2 10*3/uL (ref 0.1–1.0)
Monocytes Relative: 5 %
Neutro Abs: 3 10*3/uL (ref 1.7–7.7)
Neutrophils Relative %: 65 %
Platelets: 204 10*3/uL (ref 150–400)
RBC: 2.63 MIL/uL — ABNORMAL LOW (ref 3.87–5.11)
RDW: 12.2 % (ref 11.5–15.5)
WBC: 4.6 10*3/uL (ref 4.0–10.5)
nRBC: 0 % (ref 0.0–0.2)

## 2020-10-06 LAB — BASIC METABOLIC PANEL
Anion gap: 8 (ref 5–15)
BUN: 42 mg/dL — ABNORMAL HIGH (ref 6–20)
CO2: 24 mmol/L (ref 22–32)
Calcium: 7.6 mg/dL — ABNORMAL LOW (ref 8.9–10.3)
Chloride: 99 mmol/L (ref 98–111)
Creatinine, Ser: 2.38 mg/dL — ABNORMAL HIGH (ref 0.44–1.00)
GFR, Estimated: 29 mL/min — ABNORMAL LOW (ref >60)
Glucose, Bld: 449 mg/dL — ABNORMAL HIGH (ref 70–99)
Potassium: 4.6 mmol/L (ref 3.5–5.1)
Sodium: 131 mmol/L — ABNORMAL LOW (ref 135–145)

## 2020-10-06 LAB — URINALYSIS, ROUTINE W REFLEX MICROSCOPIC
Bilirubin Urine: NEGATIVE
Glucose, UA: >=500 mg/dL — AB
Ketones, ur: 15 mg/dL — AB
Leukocytes,Ua: NEGATIVE
Nitrite: NEGATIVE
Protein, ur: >300 mg/dL — AB
Specific Gravity, Urine: 1.015 (ref 1.005–1.030)
pH: 6.5 (ref 5.0–8.0)

## 2020-10-06 LAB — I-STAT VENOUS BLOOD GAS, ED
Acid-base deficit: 1 mmol/L (ref 0.0–2.0)
Bicarbonate: 24.3 mmol/L (ref 20.0–28.0)
Calcium, Ion: 1.14 mmol/L — ABNORMAL LOW (ref 1.15–1.40)
HCT: 24 % — ABNORMAL LOW (ref 36.0–46.0)
Hemoglobin: 8.2 g/dL — ABNORMAL LOW (ref 12.0–15.0)
O2 Saturation: 83 %
Patient temperature: 98.6
Potassium: 4.7 mmol/L (ref 3.5–5.1)
Sodium: 132 mmol/L — ABNORMAL LOW (ref 135–145)
TCO2: 26 mmol/L (ref 22–32)
pCO2, Ven: 43.7 mmHg — ABNORMAL LOW (ref 44.0–60.0)
pH, Ven: 7.353 (ref 7.250–7.430)
pO2, Ven: 50 mmHg — ABNORMAL HIGH (ref 32.0–45.0)

## 2020-10-06 LAB — PREGNANCY, URINE: Preg Test, Ur: NEGATIVE

## 2020-10-06 LAB — CBG MONITORING, ED
Glucose-Capillary: 406 mg/dL — ABNORMAL HIGH (ref 70–99)
Glucose-Capillary: 383 mg/dL — ABNORMAL HIGH (ref 70–99)

## 2020-10-06 MED ORDER — SODIUM CHLORIDE 0.9 % IV BOLUS
1000.0000 mL | Freq: Once | INTRAVENOUS | Status: AC
  Administered 2020-10-06: 1000 mL via INTRAVENOUS

## 2020-10-06 MED ORDER — METOCLOPRAMIDE HCL 5 MG/ML IJ SOLN
10.0000 mg | Freq: Once | INTRAMUSCULAR | Status: AC
  Administered 2020-10-06: 10 mg via INTRAVENOUS
  Filled 2020-10-06: qty 2

## 2020-10-06 MED ORDER — INSULIN ASPART 100 UNIT/ML ~~LOC~~ SOLN
10.0000 [IU] | Freq: Once | SUBCUTANEOUS | Status: AC
  Administered 2020-10-06: 10 [IU] via SUBCUTANEOUS
  Filled 2020-10-06: qty 10

## 2020-10-06 MED ORDER — DIPHENHYDRAMINE HCL 50 MG/ML IJ SOLN
25.0000 mg | Freq: Once | INTRAMUSCULAR | Status: AC
  Administered 2020-10-06: 25 mg via INTRAVENOUS
  Filled 2020-10-06: qty 1

## 2020-10-06 NOTE — ED Provider Notes (Signed)
[facility] Provider Note: [name], MD, FACEP  CSN: [id number] MRN: [id number] ARRIVAL: 10/06/20 at [place]: [place]  Headache   HISTORY OF PRESENT ILLNESS  10/06/20 3:02 AM Kathy Lewis is a 25 y.o. female with a headache that began about 11 PM yesterday evening.  She describes the headache as throbbing, diffusely localized, and an 8 out of 10 in severity.  She has had associated nausea but no vomiting.  Light bothers her eyes.  She has taken Tylenol without relief.  She also believes her sugar is up.  She has had increased urination and thirst despite taking her prescribed insulin.  EMS reports sugar was 517 prior to arrival and was 406 here.  She denies being short of breath.  She has been prescribed Fioricet for her migraines in the past but avoids taking it because she is afraid it may harm her kidneys.   Past Medical History:  Diagnosis Date  . Anxiety   . Bipolar 2 disorder ([place])   . CHF (congestive heart failure) ([place])   . CKD (chronic kidney disease), stage III ([place])   . Depression   . DKA (diabetic ketoacidoses)   . HSV infection    on valtrex  . Hypokalemia   . Leukocytosis   . Migraine   . Noncompliance with medication regimen   . Preeclampsia   . Severe anemia   . Type 1 diabetes mellitus ([place])     Past Surgical History:  Procedure Laterality Date  . CARDIAC CATHETERIZATION    . DILATION AND EVACUATION N/A 10/22/2019   Procedure: ULTRASOUND GUIDED DILATATION AND EVACUATION;  Surgeon: [name], MD;  Location: [facility];  Service: Gynecology;  Laterality: N/A;  . NO PAST SURGERIES    . RENAL BIOPSY      Family History  Adopted: Yes  Family history unknown: Yes    Social History   Tobacco Use  . Smoking status: Current Every Day Smoker    Packs/day: 1.00    Types: Cigars  . Smokeless tobacco: Never Used  Vaping Use  . Vaping Use: Never used  Substance Use Topics  . Alcohol use: No  . Drug use: No     Prior to Admission medications   Medication Sig Start Date End Date Taking? Authorizing Provider  carvedilol (COREG) 3.125 MG tablet Take 6.25 mg by mouth 2 (two) times daily with a meal.  04/13/20  Yes [provider]  furosemide (LASIX) 40 MG tablet Take 1 tablet (40 mg total) by mouth daily. Patient taking differently: Take 20 mg by mouth 2 (two) times daily. 10/28/19  Yes [name], MD  insulin aspart (NOVOLOG) 100 UNIT/ML injection Inject 2 Units into the skin 3 (three) times daily with meals. Patient taking differently: Inject 2-8 Units into the skin 3 (three) times daily with meals. Per sliding 10/30/19  Yes Christophe Louis, MD  insulin detemir (LEVEMIR) 100 UNIT/ML injection Inject 0.08 mLs (8 Units total) into the skin daily. Patient taking differently: Inject 10 Units into the skin daily. 10/31/19  Yes Christophe Louis, MD  Insulin Syringe-Needle U-100 (INSULIN SYRINGE .3CC/31GX5/16") 31G X 5/16" 0.3 ML MISC Use as directed 10/27/19  Yes [name], MD  potassium chloride (KLOR-CON) 10 MEQ tablet Take 1 tablet (10 mEq total) by mouth daily. 10/27/19  Yes [name], MD  Vitamin D, Ergocalciferol, (DRISDOL) 1.25 MG (50000 UNIT) CAPS capsule Take 50,000 Units by mouth every 7 (seven) days.   Yes [provider]  albuterol (VENTOLIN HFA) 108 (90 Base) MCG/ACT inhaler Inhale 2 puffs into the lungs every 6 (six) hours as needed for wheezing or shortness of breath.  01/15/20 01/14/21  [provider]  escitalopram (LEXAPRO) 10 MG tablet Take 20 mg by mouth daily.  05/10/20 10/06/20  [provider]  lisinopril (ZESTRIL) 10 MG tablet Take 10 mg by mouth daily.  02/23/20 10/06/20  [provider]  spironolactone (ALDACTONE) 25 MG tablet Take 25 mg by mouth daily.  04/20/20 10/06/20  [provider]    Allergies Cantaloupe extract allergy skin test and Nsaids   REVIEW OF SYSTEMS  Negative except as noted here or in the History of Present  Illness.   PHYSICAL EXAMINATION  Initial Vital Signs Blood pressure (!) 143/95, pulse 82, temperature 98.2 F (36.8 C), temperature source Oral, resp. rate 16, height 5' (1.524 m), weight 59 kg, SpO2 99 %, currently breastfeeding.  Examination General: Well-developed, well-nourished female in no acute distress; appearance consistent with age of record HENT: normocephalic; atraumatic Eyes: pupils equal, round and reactive to light; extraocular muscles intact Neck: supple Heart: regular rate and rhythm Lungs: clear to auscultation bilaterally Abdomen: soft; nondistended; nontender; bowel sounds present Extremities: No deformity; full range of motion; pulses normal Neurologic: Awake, alert and oriented; motor function intact in all extremities and symmetric; no facial droop Skin: Warm and dry Psychiatric: Flat affect   RESULTS  Summary of this visit's results, reviewed and interpreted by myself:   EKG Interpretation  Date/Time:    Ventricular Rate:    PR Interval:    QRS Duration:   QT Interval:    QTC Calculation:   R Axis:     Text Interpretation:        Laboratory Studies: Results for orders placed or performed during the hospital encounter of 10/06/20 (from the past 24 hour(s))  CBG monitoring, ED     Status: Abnormal   Collection Time: 10/06/20  3:07 AM  Result Value Ref Range   Glucose-Capillary 406 (H) 70 - 99 mg/dL  CBC with Differential/Platelet     Status: Abnormal   Collection Time: 10/06/20  3:22 AM  Result Value Ref Range   WBC 4.6 4.0 - 10.5 K/uL   RBC 2.63 (L) 3.87 - 5.11 MIL/uL   Hemoglobin 8.4 (L) 12.0 - 15.0 g/dL   HCT 24.9 (L) 36.0 - 46.0 %   MCV 94.7 80.0 - 100.0 fL   MCH 31.9 26.0 - 34.0 pg   MCHC 33.7 30.0 - 36.0 g/dL   RDW 12.2 11.5 - 15.5 %   Platelets 204 150 - 400 K/uL   nRBC 0.0 0.0 - 0.2 %   Neutrophils Relative % 65 %   Neutro Abs 3.0 1.7 - 7.7 K/uL   Lymphocytes Relative 28 %   Lymphs Abs 1.3 0.7 - 4.0 K/uL   Monocytes Relative 5  %   Monocytes Absolute 0.2 0.1 - 1.0 K/uL   Eosinophils Relative 1 %   Eosinophils Absolute 0.0 0.0 - 0.5 K/uL   Basophils Relative 1 %   Basophils Absolute 0.0 0.0 - 0.1 K/uL   Immature Granulocytes 0 %   Abs Immature Granulocytes 0.01 0.00 - 0.07 K/uL  Basic metabolic panel     Status: Abnormal   Collection Time: 10/06/20  3:22 AM  Result Value Ref Range   Sodium 131 (L) 135 - 145 mmol/L   Potassium 4.6 3.5 - 5.1 mmol/L   Chloride 99 98 - 111 mmol/L  CO2 24 22 - 32 mmol/L   Glucose, Bld 449 (H) 70 - 99 mg/dL   BUN 42 (H) 6 - 20 mg/dL   Creatinine, Ser 2.38 (H) 0.44 - 1.00 mg/dL   Calcium 7.6 (L) 8.9 - 10.3 mg/dL   GFR, Estimated 29 (L) >60 mL/min   Anion gap 8 5 - 15  I-Stat venous blood gas, ([facility] ED)     Status: Abnormal   Collection Time: 10/06/20  3:29 AM  Result Value Ref Range   pH, Ven 7.353 7.250 - 7.430   pCO2, Ven 43.7 (L) 44.0 - 60.0 mmHg   pO2, Ven 50.0 (H) 32.0 - 45.0 mmHg   Bicarbonate 24.3 20.0 - 28.0 mmol/L   TCO2 26 22 - 32 mmol/L   O2 Saturation 83.0 %   Acid-base deficit 1.0 0.0 - 2.0 mmol/L   Sodium 132 (L) 135 - 145 mmol/L   Potassium 4.7 3.5 - 5.1 mmol/L   Calcium, Ion 1.14 (L) 1.15 - 1.40 mmol/L   HCT 24.0 (L) 36.0 - 46.0 %   Hemoglobin 8.2 (L) 12.0 - 15.0 g/dL   Patient temperature 98.6 F    Collection site Radial    Drawn by RT    Sample type VENOUS   Urinalysis, Routine w reflex microscopic Urine, Clean Catch     Status: Abnormal   Collection Time: 10/06/20  4:17 AM  Result Value Ref Range   Color, Urine STRAW (A) YELLOW   APPearance CLEAR CLEAR   Specific Gravity, Urine 1.015 1.005 - 1.030   pH 6.5 5.0 - 8.0   Glucose, UA >=500 (A) NEGATIVE mg/dL   Hgb urine dipstick LARGE (A) NEGATIVE   Bilirubin Urine NEGATIVE NEGATIVE   Ketones, ur 15 (A) NEGATIVE mg/dL   Protein, ur >300 (A) NEGATIVE mg/dL   Nitrite NEGATIVE NEGATIVE   Leukocytes,Ua NEGATIVE NEGATIVE  Pregnancy, urine     Status: None   Collection Time: 10/06/20  4:17 AM  Result  Value Ref Range   Preg Test, Ur NEGATIVE NEGATIVE  Urinalysis, Microscopic (reflex)     Status: Abnormal   Collection Time: 10/06/20  4:17 AM  Result Value Ref Range   RBC / HPF 21-50 0 - 5 RBC/hpf   WBC, UA 0-5 0 - 5 WBC/hpf   Bacteria, UA RARE (A) NONE SEEN   Squamous Epithelial / LPF 0-5 0 - 5  CBG monitoring, ED     Status: Abnormal   Collection Time: 10/06/20  4:28 AM  Result Value Ref Range   Glucose-Capillary 383 (H) 70 - 99 mg/dL   Imaging Studies: No results found.  ED COURSE and MDM  Nursing notes, initial and subsequent vitals signs, including pulse oximetry, reviewed and interpreted by myself.  Vitals:   10/06/20 0259 10/06/20 0300 10/06/20 0404 10/06/20 0449  BP: (!) 143/95  (!) 154/100 122/77  Pulse: 82  93 85  Resp: '16  10 16  '$ Temp: 98.2 F (36.8 C)     TempSrc: Oral     SpO2: 99%  99% 97%  Weight:  59 kg    Height:  5' (1.524 m)     Medications  sodium chloride 0.9 % bolus 1,000 mL (0 mLs Intravenous Stopped 10/06/20 0404)  diphenhydrAMINE (BENADRYL) injection 25 mg (25 mg Intravenous Given 10/06/20 0327)  metoCLOPramide (REGLAN) injection 10 mg (10 mg Intravenous Given 10/06/20 0327)  insulin aspart (novoLOG) injection 10 Units (10 Units Subcutaneous Given 10/06/20 0335)  sodium chloride 0.9 % bolus 1,000 mL (1,000  mLs Intravenous New Bag/Given 10/06/20 0447)   3:31 AM Venous blood gas not consistent with diabetic ketoacidosis.   3:47 AM CBC consistent with patient's known chronic anemia.  BUN and creatinine consistent with patient's known chronic kidney disease.   4:42 AM Headache down to a 2 out of 10 after IV fluids medications.   PROCEDURES  Procedures   ED DIAGNOSES     ICD-10-CM   1. Sporadic migraine  G43.409   2. Hyperglycemia  R73.9   3. Chronic anemia  D64.9   4. Chronic renal insufficiency, unspecified stage  N18.9        Norwood Quezada, Jenny Reichmann, MD 10/06/20 [id number]

## 2020-10-06 NOTE — ED Notes (Signed)
Patient verbalizes understanding of discharge instructions. Opportunity for questioning and answers were provided. Armband removed by staff, pt discharged from ED ambulatory to home.  

## 2020-10-06 NOTE — ED Triage Notes (Signed)
Pt BIB [facility] from Home with Nausea, HTN, Hyperglycemia and Headache.  Headache began approx. 1100, 10/05/20 along with HTN and Hyperglycemia. Onset of Nausea approx a few hours PTA.  CBG with EMS: 517 EMS BP: [id number] systolic All other VSS with EMS  A&Ox4, GCS 15, Ambulatory

## 2020-10-09 ENCOUNTER — Emergency Department (HOSPITAL_BASED_OUTPATIENT_CLINIC_OR_DEPARTMENT_OTHER)
Admission: EM | Admit: 2020-10-09 | Discharge: 2020-10-09 | Disposition: A | Discharge status: Home / Self Care | Payer: Medicaid Other | Attending: Emergency Medicine | Admitting: Emergency Medicine

## 2020-10-09 ENCOUNTER — Encounter (HOSPITAL_BASED_OUTPATIENT_CLINIC_OR_DEPARTMENT_OTHER): Payer: Self-pay

## 2020-10-09 ENCOUNTER — Other Ambulatory Visit: Payer: Self-pay

## 2020-10-09 DIAGNOSIS — I10 Essential (primary) hypertension: Secondary | ICD-10-CM | POA: Insufficient documentation

## 2020-10-09 DIAGNOSIS — Z5321 Procedure and treatment not carried out due to patient leaving prior to being seen by health care provider: Secondary | ICD-10-CM | POA: Insufficient documentation

## 2020-10-09 NOTE — ED Triage Notes (Signed)
Pt presents with complaints of hypertension and headache x today. States she takes rx Hydralazine and took a "double dose" after having a BP reading of 150/100 at home. Is concerned because she feels "spacy" in her head.

## 2021-01-25 IMAGING — US US RENAL
1 series · 15 of 25 positions shown · non-contrast
Comparison: CT 10/15/2017

CLINICAL DATA: Proteinuria, 5 weeks 6 days pregnant

EXAM:
RENAL / URINARY TRACT ULTRASOUND COMPLETE

[Series 1: us renal · 15 of 40 slices shown]
[im 1/40]
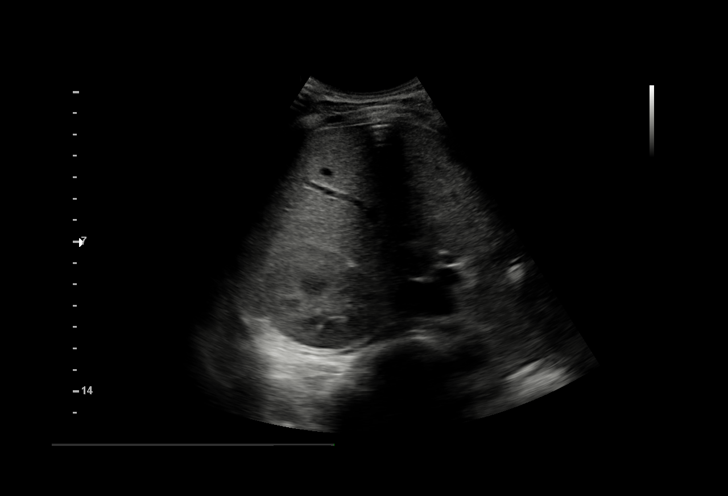
[im 4/40]
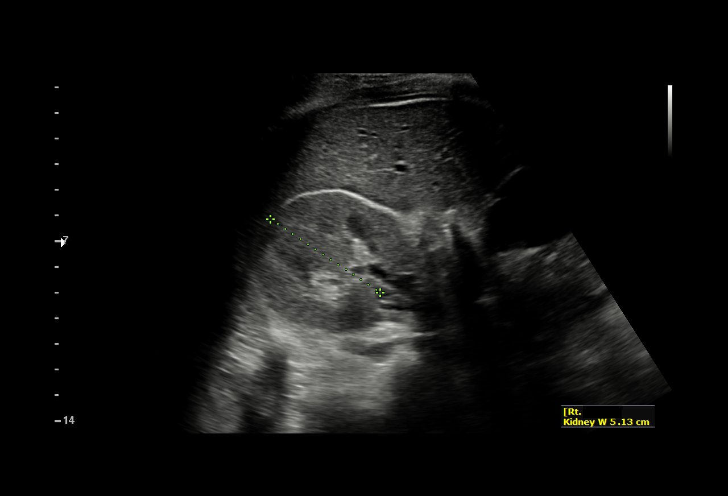
[im 7/40]
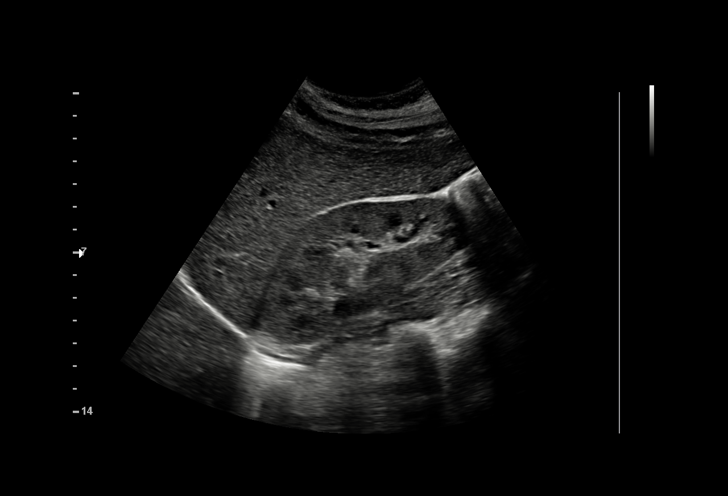
[im 9/40]
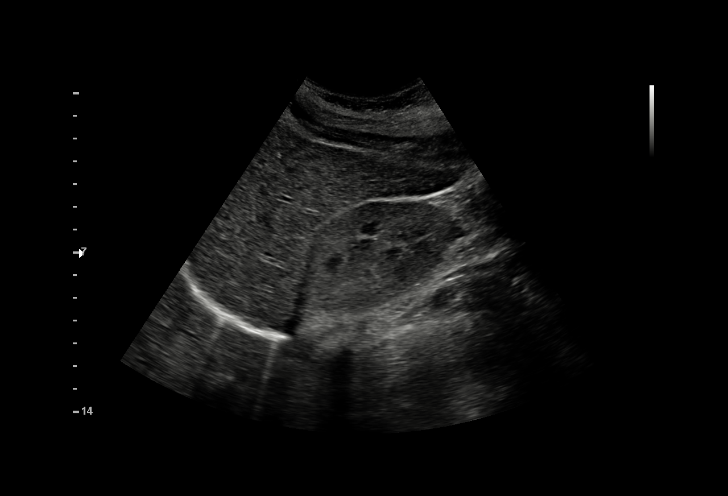
[im 12/40]
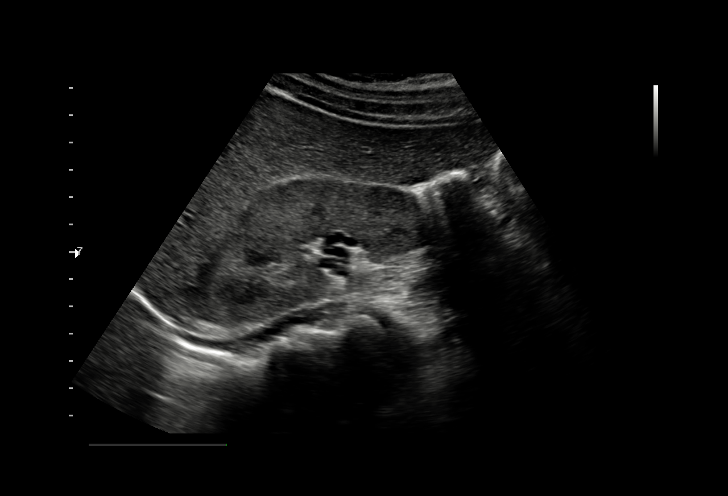
[im 15/40]
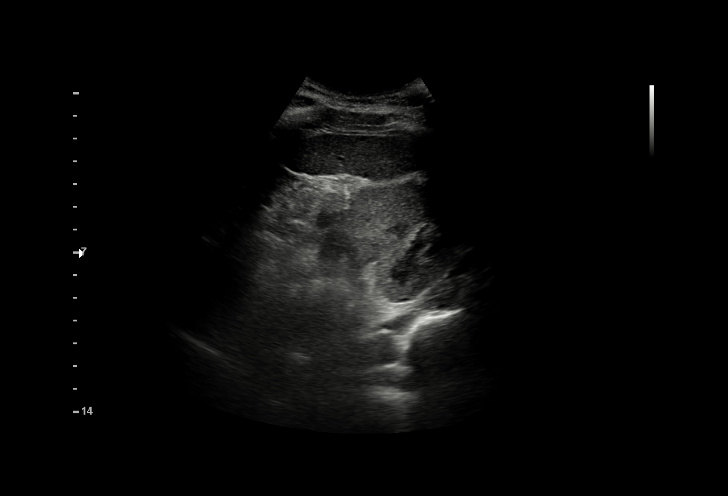
[im 17/40]
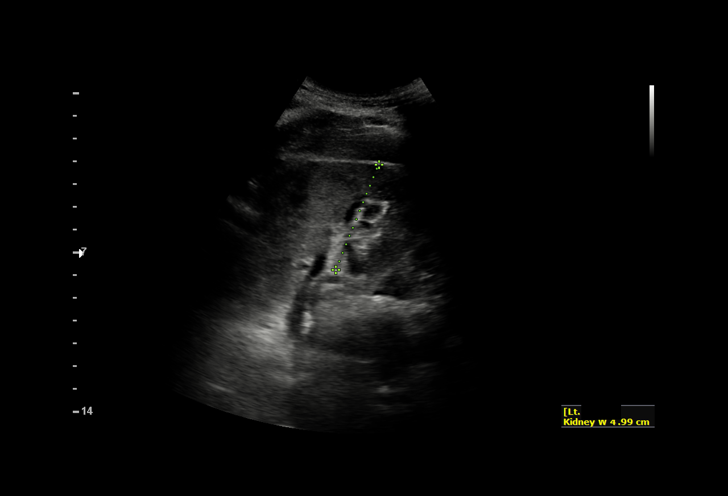
[im 20/40]
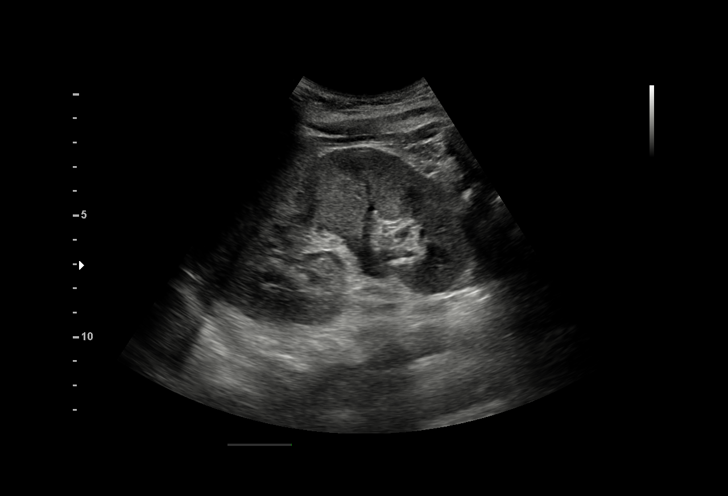
[im 23/40]
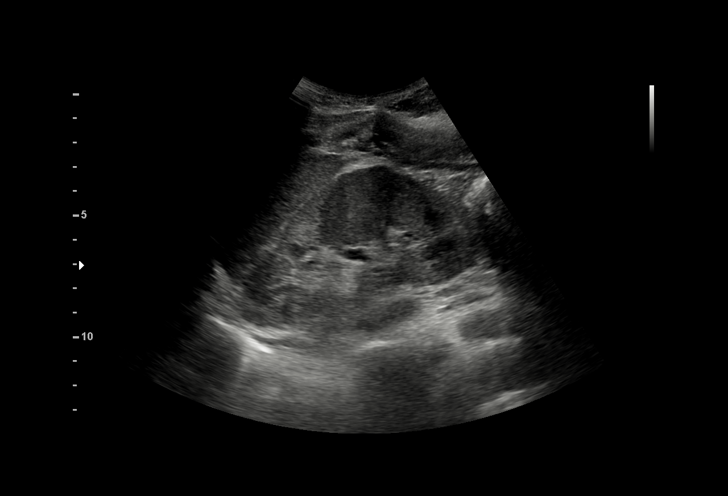
[im 25/40]
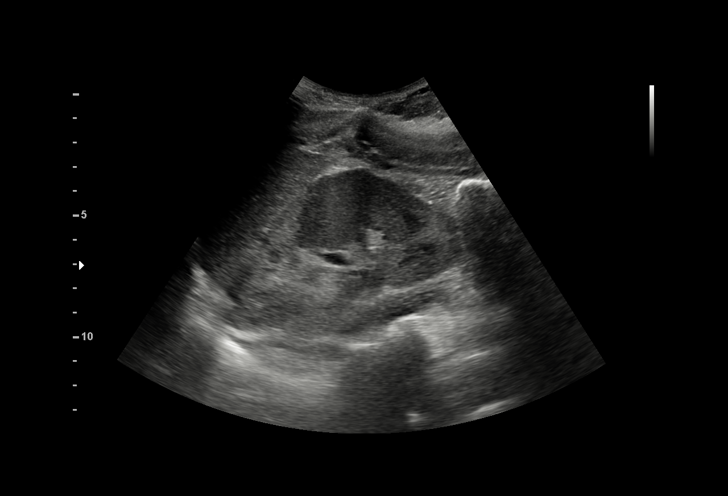
[im 28/40]
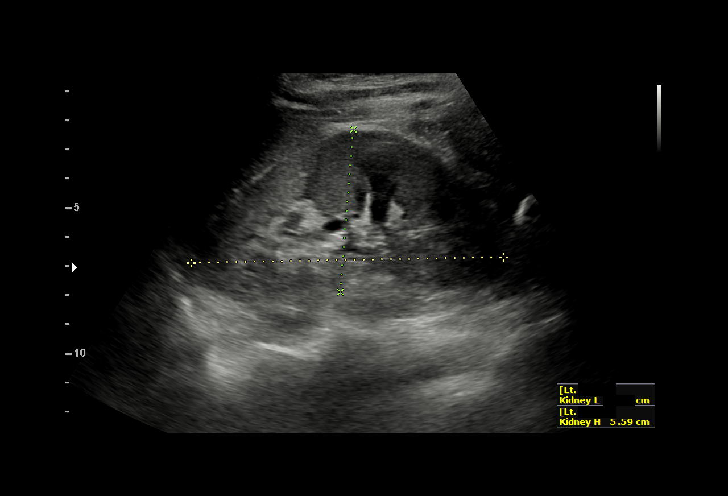
[im 31/40]
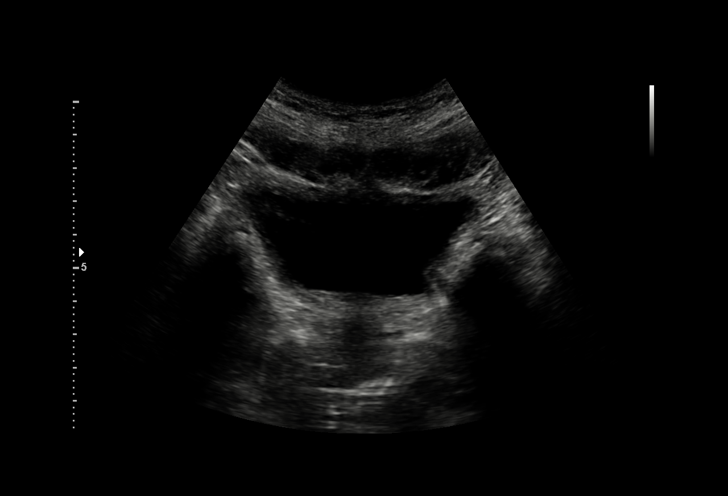
[im 33/40]
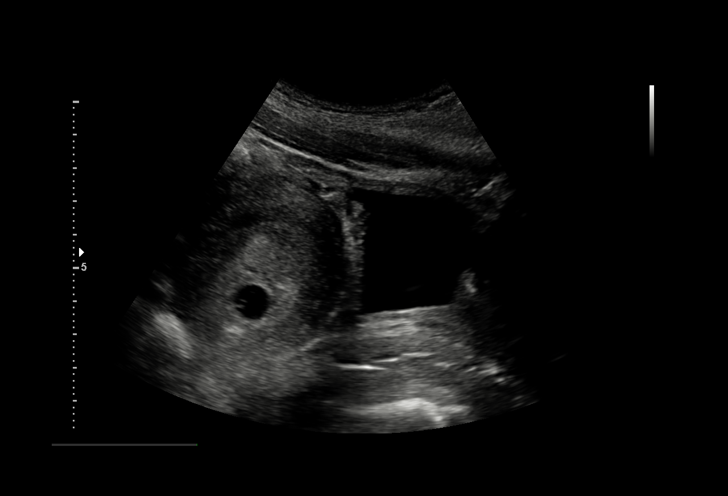
[im 36/40]
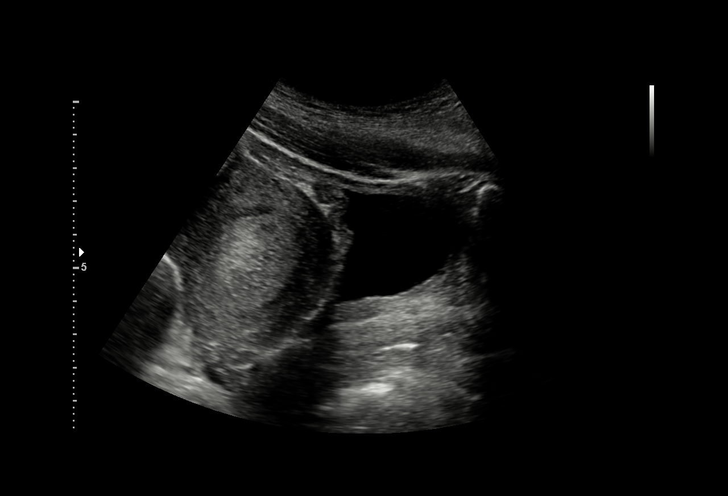
[im 40/40]
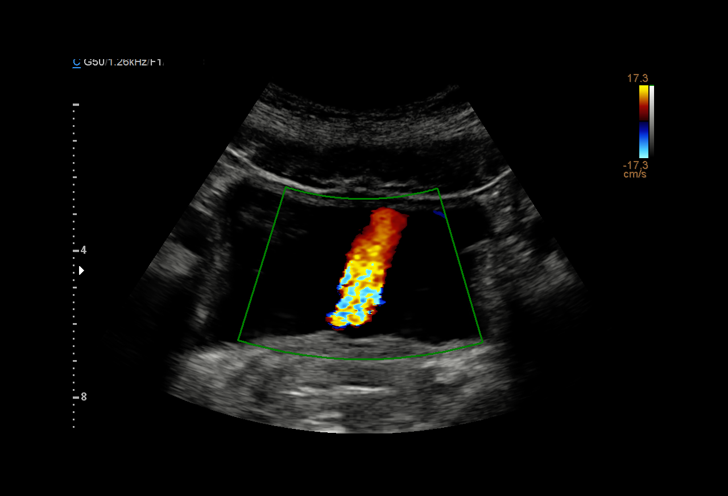

[15 of 25 positions shown; findings below may reference images not displayed]

FINDINGS: Right Kidney:

Renal measurements: 11 x 4.6 x 5.1 cm = volume: 136.6 mL .
Echogenicity within normal limits. No mass or hydronephrosis
visualized.

Left Kidney:

Renal measurements: 10.7 x 5.6 x 5 cm = volume: 156.5 mL.
Echogenicity within normal limits. No mass or hydronephrosis
visualized.

Bladder:

Appears normal for degree of bladder distention.
IMPRESSION: Negative renal ultrasound

## 2021-02-16 ENCOUNTER — Emergency Department (HOSPITAL_COMMUNITY): Payer: Medicaid Other

## 2021-02-16 ENCOUNTER — Encounter (HOSPITAL_COMMUNITY): Payer: Self-pay | Admitting: *Deleted

## 2021-02-16 ENCOUNTER — Inpatient Hospital Stay (HOSPITAL_COMMUNITY): Payer: Medicaid Other

## 2021-02-16 ENCOUNTER — Inpatient Hospital Stay (HOSPITAL_COMMUNITY)
Admission: EM | Admit: 2021-02-16 | Discharge: 2021-02-18 | DRG: 638 | Disposition: A | Discharge status: Home / Self Care | Payer: Medicaid Other | Attending: Internal Medicine | Admitting: Internal Medicine

## 2021-02-16 ENCOUNTER — Other Ambulatory Visit: Payer: Self-pay

## 2021-02-16 DIAGNOSIS — F3181 Bipolar II disorder: Secondary | ICD-10-CM | POA: Diagnosis present

## 2021-02-16 DIAGNOSIS — N179 Acute kidney failure, unspecified: Secondary | ICD-10-CM | POA: Diagnosis present

## 2021-02-16 DIAGNOSIS — Z79899 Other long term (current) drug therapy: Secondary | ICD-10-CM

## 2021-02-16 DIAGNOSIS — N184 Chronic kidney disease, stage 4 (severe): Secondary | ICD-10-CM | POA: Diagnosis present

## 2021-02-16 DIAGNOSIS — Z20822 Contact with and (suspected) exposure to covid-19: Secondary | ICD-10-CM | POA: Diagnosis present

## 2021-02-16 DIAGNOSIS — E1100 Type 2 diabetes mellitus with hyperosmolarity without nonketotic hyperglycemic-hyperosmolar coma (NKHHC): Secondary | ICD-10-CM | POA: Diagnosis not present

## 2021-02-16 DIAGNOSIS — Z91018 Allergy to other foods: Secondary | ICD-10-CM

## 2021-02-16 DIAGNOSIS — E1300 Other specified diabetes mellitus with hyperosmolarity without nonketotic hyperglycemic-hyperosmolar coma (NKHHC): Secondary | ICD-10-CM | POA: Diagnosis not present

## 2021-02-16 DIAGNOSIS — R112 Nausea with vomiting, unspecified: Secondary | ICD-10-CM | POA: Diagnosis present

## 2021-02-16 DIAGNOSIS — I5022 Chronic systolic (congestive) heart failure: Secondary | ICD-10-CM | POA: Diagnosis present

## 2021-02-16 DIAGNOSIS — R739 Hyperglycemia, unspecified: Secondary | ICD-10-CM | POA: Diagnosis not present

## 2021-02-16 DIAGNOSIS — F419 Anxiety disorder, unspecified: Secondary | ICD-10-CM | POA: Diagnosis present

## 2021-02-16 DIAGNOSIS — I130 Hypertensive heart and chronic kidney disease with heart failure and stage 1 through stage 4 chronic kidney disease, or unspecified chronic kidney disease: Secondary | ICD-10-CM | POA: Diagnosis present

## 2021-02-16 DIAGNOSIS — E1165 Type 2 diabetes mellitus with hyperglycemia: Secondary | ICD-10-CM | POA: Diagnosis not present

## 2021-02-16 DIAGNOSIS — R059 Cough, unspecified: Secondary | ICD-10-CM

## 2021-02-16 DIAGNOSIS — E860 Dehydration: Secondary | ICD-10-CM | POA: Diagnosis present

## 2021-02-16 DIAGNOSIS — Z87891 Personal history of nicotine dependence: Secondary | ICD-10-CM | POA: Diagnosis not present

## 2021-02-16 DIAGNOSIS — E861 Hypovolemia: Secondary | ICD-10-CM | POA: Diagnosis present

## 2021-02-16 DIAGNOSIS — D631 Anemia in chronic kidney disease: Secondary | ICD-10-CM | POA: Diagnosis present

## 2021-02-16 DIAGNOSIS — Z886 Allergy status to analgesic agent status: Secondary | ICD-10-CM | POA: Diagnosis not present

## 2021-02-16 DIAGNOSIS — I10 Essential (primary) hypertension: Secondary | ICD-10-CM

## 2021-02-16 DIAGNOSIS — D649 Anemia, unspecified: Secondary | ICD-10-CM | POA: Diagnosis present

## 2021-02-16 DIAGNOSIS — Z794 Long term (current) use of insulin: Secondary | ICD-10-CM

## 2021-02-16 LAB — I-STAT BETA HCG BLOOD, ED (MC, WL, AP ONLY): I-stat hCG, quantitative: <5 m[IU]/mL (ref <5)

## 2021-02-16 LAB — CBG MONITORING, ED
Glucose-Capillary: 503 mg/dL (ref 70–99)
Glucose-Capillary: 418 mg/dL — ABNORMAL HIGH (ref 70–99)

## 2021-02-16 LAB — RENAL FUNCTION PANEL
Albumin: 2.4 g/dL — ABNORMAL LOW (ref 3.5–5.0)
Albumin: 2.1 g/dL — ABNORMAL LOW (ref 3.5–5.0)
Albumin: 1.9 g/dL — ABNORMAL LOW (ref 3.5–5.0)
Anion gap: 6 (ref 5–15)
Anion gap: 4 — ABNORMAL LOW (ref 5–15)
Anion gap: 3 — ABNORMAL LOW (ref 5–15)
BUN: 42 mg/dL — ABNORMAL HIGH (ref 6–20)
BUN: 39 mg/dL — ABNORMAL HIGH (ref 6–20)
BUN: 37 mg/dL — ABNORMAL HIGH (ref 6–20)
CO2: 22 mmol/L (ref 22–32)
CO2: 25 mmol/L (ref 22–32)
CO2: 26 mmol/L (ref 22–32)
Calcium: 7.9 mg/dL — ABNORMAL LOW (ref 8.9–10.3)
Calcium: 7.8 mg/dL — ABNORMAL LOW (ref 8.9–10.3)
Calcium: 7.8 mg/dL — ABNORMAL LOW (ref 8.9–10.3)
Chloride: 104 mmol/L (ref 98–111)
Chloride: 109 mmol/L (ref 98–111)
Chloride: 110 mmol/L (ref 98–111)
Creatinine, Ser: 4.22 mg/dL — ABNORMAL HIGH (ref 0.44–1.00)
Creatinine, Ser: 3.8 mg/dL — ABNORMAL HIGH (ref 0.44–1.00)
Creatinine, Ser: 3.83 mg/dL — ABNORMAL HIGH (ref 0.44–1.00)
GFR, Estimated: 14 mL/min — ABNORMAL LOW (ref >60)
GFR, Estimated: 16 mL/min — ABNORMAL LOW (ref >60)
GFR, Estimated: 16 mL/min — ABNORMAL LOW (ref >60)
Glucose, Bld: 555 mg/dL (ref 70–99)
Glucose, Bld: 149 mg/dL — ABNORMAL HIGH (ref 70–99)
Glucose, Bld: 118 mg/dL — ABNORMAL HIGH (ref 70–99)
Phosphorus: 3.9 mg/dL (ref 2.5–4.6)
Phosphorus: 4.2 mg/dL (ref 2.5–4.6)
Phosphorus: 4 mg/dL (ref 2.5–4.6)
Potassium: 4.4 mmol/L (ref 3.5–5.1)
Potassium: 4.5 mmol/L (ref 3.5–5.1)
Potassium: 4.6 mmol/L (ref 3.5–5.1)
Sodium: 132 mmol/L — ABNORMAL LOW (ref 135–145)
Sodium: 138 mmol/L (ref 135–145)
Sodium: 139 mmol/L (ref 135–145)

## 2021-02-16 LAB — CBC
HCT: 28.5 % — ABNORMAL LOW (ref 36.0–46.0)
Hemoglobin: 9.3 g/dL — ABNORMAL LOW (ref 12.0–15.0)
MCH: 31.6 pg (ref 26.0–34.0)
MCHC: 32.6 g/dL (ref 30.0–36.0)
MCV: 96.9 fL (ref 80.0–100.0)
Platelets: 186 10*3/uL (ref 150–400)
RBC: 2.94 MIL/uL — ABNORMAL LOW (ref 3.87–5.11)
RDW: 11.9 % (ref 11.5–15.5)
WBC: 4 10*3/uL (ref 4.0–10.5)
nRBC: 0 % (ref 0.0–0.2)

## 2021-02-16 LAB — BASIC METABOLIC PANEL
Anion gap: 7 (ref 5–15)
BUN: 43 mg/dL — ABNORMAL HIGH (ref 6–20)
CO2: 22 mmol/L (ref 22–32)
Calcium: 8 mg/dL — ABNORMAL LOW (ref 8.9–10.3)
Chloride: 105 mmol/L (ref 98–111)
Creatinine, Ser: 4.11 mg/dL — ABNORMAL HIGH (ref 0.44–1.00)
GFR, Estimated: 15 mL/min — ABNORMAL LOW (ref >60)
Glucose, Bld: 550 mg/dL (ref 70–99)
Potassium: 4.4 mmol/L (ref 3.5–5.1)
Sodium: 134 mmol/L — ABNORMAL LOW (ref 135–145)

## 2021-02-16 LAB — URINALYSIS, ROUTINE W REFLEX MICROSCOPIC
Bilirubin Urine: NEGATIVE
Glucose, UA: >=500 mg/dL — AB
Ketones, ur: NEGATIVE mg/dL
Leukocytes,Ua: NEGATIVE
Nitrite: NEGATIVE
Protein, ur: 100 mg/dL — AB
Specific Gravity, Urine: 1.017 (ref 1.005–1.030)
pH: 6 (ref 5.0–8.0)

## 2021-02-16 LAB — GLUCOSE, CAPILLARY
Glucose-Capillary: 115 mg/dL — ABNORMAL HIGH (ref 70–99)
Glucose-Capillary: 115 mg/dL — ABNORMAL HIGH (ref 70–99)
Glucose-Capillary: 129 mg/dL — ABNORMAL HIGH (ref 70–99)
Glucose-Capillary: 144 mg/dL — ABNORMAL HIGH (ref 70–99)
Glucose-Capillary: 145 mg/dL — ABNORMAL HIGH (ref 70–99)
Glucose-Capillary: 159 mg/dL — ABNORMAL HIGH (ref 70–99)
Glucose-Capillary: 166 mg/dL — ABNORMAL HIGH (ref 70–99)
Glucose-Capillary: 207 mg/dL — ABNORMAL HIGH (ref 70–99)
Glucose-Capillary: 279 mg/dL — ABNORMAL HIGH (ref 70–99)
Glucose-Capillary: 309 mg/dL — ABNORMAL HIGH (ref 70–99)
Glucose-Capillary: 372 mg/dL — ABNORMAL HIGH (ref 70–99)
Glucose-Capillary: 442 mg/dL — ABNORMAL HIGH (ref 70–99)

## 2021-02-16 LAB — HEMOGLOBIN A1C
Hgb A1c MFr Bld: 8.6 % — ABNORMAL HIGH (ref 4.8–5.6)
Mean Plasma Glucose: 200.12 mg/dL

## 2021-02-16 LAB — RESP PANEL BY RT-PCR (FLU A&B, COVID) ARPGX2
Influenza A by PCR: NEGATIVE
Influenza B by PCR: NEGATIVE
SARS Coronavirus 2 by RT PCR: NEGATIVE

## 2021-02-16 LAB — PROCALCITONIN: Procalcitonin: 0.35 ng/mL

## 2021-02-16 LAB — MRSA PCR SCREENING: MRSA by PCR: NEGATIVE

## 2021-02-16 LAB — BETA-HYDROXYBUTYRIC ACID: Beta-Hydroxybutyric Acid: 0.21 mmol/L (ref 0.05–0.27)

## 2021-02-16 MED ORDER — INSULIN ASPART 100 UNIT/ML IJ SOLN: 0.0000 [IU] | Freq: Every day | INTRAMUSCULAR | Status: DC

## 2021-02-16 MED ORDER — DEXTROSE 50 % IV SOLN: 0.0000 mL | INTRAVENOUS | Status: DC | PRN

## 2021-02-16 MED ORDER — ENOXAPARIN SODIUM 40 MG/0.4ML IJ SOSY: 40.0000 mg | PREFILLED_SYRINGE | INTRAMUSCULAR | Status: DC

## 2021-02-16 MED ORDER — INSULIN ASPART 100 UNIT/ML IJ SOLN
0.0000 [IU] | Freq: Three times a day (TID) | INTRAMUSCULAR | Status: DC
  Administered 2021-02-17: 5 [IU] via SUBCUTANEOUS

## 2021-02-16 MED ORDER — CHLORHEXIDINE GLUCONATE CLOTH 2 % EX PADS
6.0000 | MEDICATED_PAD | Freq: Every day | CUTANEOUS | Status: DC
  Administered 2021-02-16: 6 via TOPICAL

## 2021-02-16 MED ORDER — SODIUM CHLORIDE 0.9 % IV BOLUS
1000.0000 mL | Freq: Once | INTRAVENOUS | Status: AC
  Administered 2021-02-16: 1000 mL via INTRAVENOUS

## 2021-02-16 MED ORDER — LACTATED RINGERS IV SOLN: INTRAVENOUS | Status: DC

## 2021-02-16 MED ORDER — HYDRALAZINE HCL 20 MG/ML IJ SOLN
10.0000 mg | INTRAMUSCULAR | Status: DC | PRN
  Filled 2021-02-16: qty 1

## 2021-02-16 MED ORDER — PROCHLORPERAZINE EDISYLATE 10 MG/2ML IJ SOLN
10.0000 mg | Freq: Four times a day (QID) | INTRAMUSCULAR | Status: DC | PRN
  Administered 2021-02-16 (×2): 10 mg via INTRAVENOUS
  Filled 2021-02-16 (×2): qty 2

## 2021-02-16 MED ORDER — INSULIN ASPART 100 UNIT/ML IJ SOLN
4.0000 [IU] | Freq: Three times a day (TID) | INTRAMUSCULAR | Status: DC
  Administered 2021-02-17: 4 [IU] via SUBCUTANEOUS

## 2021-02-16 MED ORDER — ACETAMINOPHEN 325 MG PO TABS
650.0000 mg | ORAL_TABLET | Freq: Four times a day (QID) | ORAL | Status: DC | PRN
  Administered 2021-02-16: 650 mg via ORAL
  Filled 2021-02-16: qty 2

## 2021-02-16 MED ORDER — ONDANSETRON HCL 4 MG/2ML IJ SOLN
4.0000 mg | Freq: Once | INTRAMUSCULAR | Status: AC
  Administered 2021-02-16: 4 mg via INTRAVENOUS
  Filled 2021-02-16: qty 2

## 2021-02-16 MED ORDER — ENOXAPARIN SODIUM 30 MG/0.3ML IJ SOSY
30.0000 mg | PREFILLED_SYRINGE | INTRAMUSCULAR | Status: DC
  Filled 2021-02-16 (×2): qty 0.3

## 2021-02-16 MED ORDER — INSULIN GLARGINE 100 UNIT/ML ~~LOC~~ SOLN
10.0000 [IU] | SUBCUTANEOUS | Status: AC
Start: 2021-02-16 — End: 2021-02-16
  Administered 2021-02-16: 10 [IU] via SUBCUTANEOUS
  Filled 2021-02-16: qty 0.1

## 2021-02-16 MED ORDER — INSULIN REGULAR(HUMAN) IN NACL 100-0.9 UT/100ML-% IV SOLN
INTRAVENOUS | Status: DC
  Administered 2021-02-16: 5.5 [IU]/h via INTRAVENOUS
  Filled 2021-02-16: qty 100

## 2021-02-16 MED ORDER — DEXTROSE IN LACTATED RINGERS 5 % IV SOLN: INTRAVENOUS | Status: DC

## 2021-02-16 NOTE — Progress Notes (Signed)
Inpatient Diabetes Program Recommendations  AACE/ADA: New Consensus Statement on Inpatient Glycemic Control (2015)  Target Ranges:  Prepandial:   less than 140 mg/dL      Peak postprandial:   less than 180 mg/dL (1-2 hours)      Critically ill patients:  140 - 180 mg/dL   Lab Results  Component Value Date   GLUCAP 207 (H) 02/16/2021   HGBA1C 8.5 (H) 07/11/2020    Review of Glycemic Control Results for CARAL, JOHNSON (MRN [id number]) as of 02/16/2021 15:57  Ref. Range 02/16/2021 09:22 02/16/2021 12:05 02/16/2021 12:54 02/16/2021 13:20 02/16/2021 14:18 02/16/2021 14:23 02/16/2021 15:20  Glucose-Capillary Latest Ref Range: 70 - 99 mg/dL 503 (HH) 418 (H) 442 (H) 372 (H) 309 (H) 279 (H) 207 (H)   Diabetes history: DM 1 Outpatient Diabetes medications:  Novolog 2-8 units tid with meals, Levemir 10 units bid Current orders for Inpatient glycemic control:  IV insulin  Inpatient Diabetes Program Recommendations:    Admitted with DKA.  History of Type One DM.   Transition recommendations once acidosis cleared and :  Levemir 6 units bid and  Novolog very sensitive correction q 4 hours.  Once eating, will also need Novolog meal coverage 3 units tid with meals.  Will follow-up with patient on 02/17/21.  A1C pending.   Thanks,  Adah Perl, RN, BC-ADM Inpatient Diabetes Coordinator Pager [phone] (8a-5p)

## 2021-02-16 NOTE — H&P (Signed)
History and Physical    Kathy Lewis [id number] DOB: July 19, 1996 DOA: 02/16/2021  PCP: [facility], [facility]  Patient coming from: Home  Chief Complaint: N/V/HA  HPI: Kathy Lewis is a 25 y.o. female with medical history significant of CKD4, bipolar, Dmt1, HFrEF. Presenting with N/V. She reports that 5 days ago, she had a cough and runny nose. It persisted through Monday, so she went to the ED. She was diagnosed w/ a viral URI and sent home on tessalon and sudafed. Her symptoms stayed about the same until this morning. This morning she started having vomiting episodes and severe headache. There was no blood in her vomit. She didn't have any fevers. She felt as if she may be going into DKA, so she came to the ED. Of note, she reports that she's compliant on her insulin as well as her other medications. She also reports that she's had a decreased appetite and poor fluid intake over the past several days.    ED Course: She was noted to have an AKI. Her glucose was 550. She was started on insulin gtt and fluids. TRH was called for admission.   Review of Systems:  Denies chest pain, ab pain, fevers, lightheadedness, dizziness, diarrhea. Review of systems is otherwise negative for all not mentioned in HPI.   PMHx Past Medical History:  Diagnosis Date   Anxiety    Bipolar 2 disorder (HCC)    CHF (congestive heart failure) (HCC)    CKD (chronic kidney disease), stage III (HCC)    Depression    DKA (diabetic ketoacidoses)    HSV infection    on valtrex   Hypokalemia    Leukocytosis    Migraine    Noncompliance with medication regimen    Preeclampsia    Severe anemia    Type 1 diabetes mellitus ([place])     PSHx Past Surgical History:  Procedure Laterality Date   CARDIAC CATHETERIZATION     DILATION AND EVACUATION N/A 10/22/2019   Procedure: ULTRASOUND GUIDED DILATATION AND EVACUATION;  Surgeon: [name], MD;  Location: [facility] LD [facility]S;  Service: Gynecology;   Laterality: N/A;   NO PAST SURGERIES     RENAL BIOPSY      SocHx  reports that she has quit smoking. Her smoking use included cigars. She smoked an average of 1.00 packs per day. She has never used smokeless tobacco. She reports that she does not drink alcohol and does not use drugs.  Allergies  Allergen Reactions   Cantaloupe Extract Allergy Skin Test Itching    Mouth itching     Nsaids     Avoid per nephrology    FamHx Family History  Adopted: Yes  Family history unknown: Yes    Prior to Admission medications   Medication Sig Start Date End Date Taking? Authorizing Provider  albuterol (VENTOLIN HFA) 108 (90 Base) MCG/ACT inhaler Inhale 2 puffs into the lungs every 6 (six) hours as needed for wheezing or shortness of breath.  01/15/20 01/14/21  [provider]  carvedilol (COREG) 3.125 MG tablet Take 6.25 mg by mouth 2 (two) times daily with a meal.  04/13/20   [provider]  furosemide (LASIX) 40 MG tablet Take 1 tablet (40 mg total) by mouth daily. Patient taking differently: Take 20 mg by mouth 2 (two) times daily. 10/28/19   [name], MD  insulin aspart (NOVOLOG) 100 UNIT/ML injection Inject 2 Units into the skin 3 (three) times daily with meals. Patient taking differently: Inject  2-8 Units into the skin 3 (three) times daily with meals. Per sliding 10/30/19   Christophe Louis, MD  insulin detemir (LEVEMIR) 100 UNIT/ML injection Inject 0.08 mLs (8 Units total) into the skin daily. Patient taking differently: Inject 10 Units into the skin daily. 10/31/19   Christophe Louis, MD  Insulin Syringe-Needle U-100 (INSULIN SYRINGE .3CC/31GX5/16") 31G X 5/16" 0.3 ML MISC Use as directed 10/27/19   [name], MD  potassium chloride (KLOR-CON) 10 MEQ tablet Take 1 tablet (10 mEq total) by mouth daily. 10/27/19   [name], MD  Vitamin D, Ergocalciferol, (DRISDOL) 1.25 MG (50000 UNIT) CAPS capsule Take 50,000 Units by mouth every 7 (seven) days.    [provider]   escitalopram (LEXAPRO) 10 MG tablet Take 20 mg by mouth daily.  05/10/20 10/06/20  [provider]  lisinopril (ZESTRIL) 10 MG tablet Take 10 mg by mouth daily.  02/23/20 10/06/20  [provider]  spironolactone (ALDACTONE) 25 MG tablet Take 25 mg by mouth daily.  04/20/20 10/06/20  [provider]    Physical Exam: Vitals:   02/16/21 1045 02/16/21 1100 02/16/21 1115 02/16/21 1134  BP: (!) 136/95 (!) 136/97 (!) 134/100 (!) 157/102  Pulse: 87 87 96 88  Resp: '10 10 18 18  '$ Temp:      TempSrc:      SpO2: 97% 99% 99% 100%    General: 25 y.o. female resting in bed in NAD Eyes: PERRL, normal sclera ENMT: Nares patent w/o discharge, orophaynx clear, dentition normal, ears w/o discharge/lesions/ulcers Neck: Supple, trachea midline Cardiovascular: RRR, +S1, S2, no m/g/r, equal pulses throughout Respiratory: CTABL, no w/r/r, normal WOB GI: BS+, NDNT, no masses noted, no organomegaly noted MSK: No e/c/c Skin: No rashes, bruises, ulcerations noted Neuro: A&O x 3, no focal deficits Psyc: Appropriate interaction and affect, calm/cooperative  Labs on Admission: I have personally reviewed following labs and imaging studies  CBC: Recent Labs  Lab 02/16/21 0952  WBC 4.0  HGB 9.3*  HCT 28.5*  MCV 96.9  PLT 99991111   Basic Metabolic Panel: Recent Labs  Lab 02/16/21 0952  NA 134*  K 4.4  CL 105  CO2 22  GLUCOSE 550*  BUN 43*  CREATININE 4.11*  CALCIUM 8.0*   GFR: CrCl cannot be calculated (Unknown ideal weight.). Liver Function Tests: No results for input(s): AST, ALT, ALKPHOS, BILITOT, PROT, ALBUMIN in the last 168 hours. No results for input(s): LIPASE, AMYLASE in the last 168 hours. No results for input(s): AMMONIA in the last 168 hours. Coagulation Profile: No results for input(s): INR, PROTIME in the last 168 hours. Cardiac Enzymes: No results for input(s): CKTOTAL, CKMB, CKMBINDEX, TROPONINI in the last 168 hours. BNP (last 3 results) No results for  input(s): PROBNP in the last 8760 hours. HbA1C: No results for input(s): HGBA1C in the last 72 hours. CBG: Recent Labs  Lab 02/16/21 0922 02/16/21 1205  GLUCAP 503* 418*   Lipid Profile: No results for input(s): CHOL, HDL, LDLCALC, TRIG, CHOLHDL, LDLDIRECT in the last 72 hours. Thyroid Function Tests: No results for input(s): TSH, T4TOTAL, FREET4, T3FREE, THYROIDAB in the last 72 hours. Anemia Panel: No results for input(s): VITAMINB12, FOLATE, FERRITIN, TIBC, IRON, RETICCTPCT in the last 72 hours. Urine analysis:    Component Value Date/Time   COLORURINE STRAW (A) 10/06/2020 0417   APPEARANCEUR CLEAR 10/06/2020 0417   LABSPEC 1.015 10/06/2020 0417   PHURINE 6.5 10/06/2020 0417   GLUCOSEU >=500 (A) 10/06/2020 0417   HGBUR LARGE (A) 10/06/2020  [place] 10/06/2020 0417   KETONESUR 15 (A) 10/06/2020 0417   PROTEINUR >300 (A) 10/06/2020 0417   UROBILINOGEN 0.2 12/15/2015 1736   NITRITE NEGATIVE 10/06/2020 0417   LEUKOCYTESUR NEGATIVE 10/06/2020 0417    Radiological Exams on Admission: DG Chest Port 1 View  Result Date: 02/16/2021 CLINICAL DATA:  Dyspnea.  Shortness of breath and emesis. EXAM: PORTABLE CHEST 1 VIEW COMPARISON:  July 10, 2020. FINDINGS: Enlarged cardiac silhouette, similar to prior. Left basilar retrocardiac opacities with suggestion of air bronchograms. No definite pleural effusions or pneumothorax on this single semi erect radiograph. No acute osseous abnormality. IMPRESSION: 1. Left basilar retrocardiac opacities, suspicious for pneumonia and/or aspiration. Dedicated PA and lateral radiographs could better characterize if clinically indicated. 2. Cardiomegaly. Electronically Signed   By: Margaretha Sheffield MD   On: 02/16/2021 11:52    EKG: Independently reviewed. Sinus, no st elevations  Assessment/Plan Hyperglycemia DMt1     - admit to inpt, SDU     - continue insulin gtt for now; transition to SQ when able     - NPO for now     -  follow fluid status  Intractable N/V     - compazine, phenergan, fluids     - NPO for now  AKI on CKD 4     - most recent GFRs are c/w CKD4     - poor PO intake recently while still using her lasix     - she is slightly hypovolemic on exam     - fluids, check renal US, watch UOP and fluid status  HFrEF     - normally on lasix; will hold for today     - getting IVF, follow fluid status     - continue her normal regimen otherwise  Bipolar disorder     - continue home regimen when confirmed  Normocytic anemia     - no evidence of bleed, follow  DVT prophylaxis: SCDs  Code Status: FULL  Family Communication: None at bedside  Consults called: None   Status is: Inpatient  Remains inpatient appropriate because:Inpatient level of care appropriate due to severity of illness  Dispo: The patient is from: Home              Anticipated d/c is to: Home              Patient currently is not medically stable to d/c.   Difficult to place patient No   Time spent coordinating admission: 70 minutes  [facility]  If 7PM-7AM, please contact night-coverage [email or web address]  02/16/2021, 12:07 PM

## 2021-02-16 NOTE — Progress Notes (Signed)
Noted assessment completed and patient c/o on going headache and feeling congested with chronic cough. Noted no meds ordered . Text page placed

## 2021-02-16 NOTE — ED Triage Notes (Signed)
Pt feels like she is in DKA-reports SOB and emesis. She was dx/d w/ URI 2 days ago. Hx type 1 diabetes.

## 2021-02-16 NOTE — ED Provider Notes (Signed)
[facility] DEPT Provider Note   CSN: [id number] Arrival date & time: 02/16/21  0913     History Chief Complaint  Patient presents with   Emesis   Hyperglycemia    Kathy Lewis is a 25 y.o. female.  25 year old female with prior medical history as detailed below presents for evaluation.  Patient reports elevated sugar over the last 2 days.  She reports onset of nausea and vomiting this morning.  She is concerned about possible recurrent DKA.  She reports that she took insulin this morning.  She denies fever.  She denies abdominal pain.  She denies shortness of breath.  She denies chest pain.  The history is provided by the patient and medical records.  Emesis Severity:  Moderate Duration:  12 hours Timing:  Constant Progression:  Worsening Chronicity:  New Hyperglycemia Associated symptoms: vomiting       Past Medical History:  Diagnosis Date   Anxiety    Bipolar 2 disorder (HCC)    CHF (congestive heart failure) (HCC)    CKD (chronic kidney disease), stage III (HCC)    Depression    DKA (diabetic ketoacidoses)    HSV infection    on valtrex   Hypokalemia    Leukocytosis    Migraine    Noncompliance with medication regimen    Preeclampsia    Severe anemia    Type 1 diabetes mellitus ([place])     Patient Active Problem List   Diagnosis Date Noted   AKI (acute kidney injury) ([place]) 02/16/2021   Acute on chronic systolic CHF (congestive heart failure) ([place]) 07/11/2020   CKD (chronic kidney disease), stage III (HCC)    Hypoalbuminemia    Anxiety    Normocytic anemia    Borderline prolonged QT interval    CHF exacerbation ([place]) 07/10/2020   Fever 10/28/2019   Preeclampsia 10/15/2019   Severe anemia 10/13/2019   Pre-existing type 1 diabetes mellitus with hyperglycemia during pregnancy in first trimester ([place]) 04/30/2019   Hypokalemia    DKA (diabetic ketoacidoses) 12/18/2014   Leukocytosis 12/18/2014   Noncompliance with  medications 12/18/2014   Type 1 diabetes mellitus with ketoacidosis, uncontrolled ([place]) 12/18/2014   Nausea and vomiting 12/18/2014    Past Surgical History:  Procedure Laterality Date   CARDIAC CATHETERIZATION     DILATION AND EVACUATION N/A 10/22/2019   Procedure: ULTRASOUND GUIDED DILATATION AND EVACUATION;  Surgeon: [name], MD;  Location: [facility] LD ORS;  Service: Gynecology;  Laterality: N/A;   NO PAST SURGERIES     RENAL BIOPSY       OB History     Gravida  2   Para  1   Term      Preterm  1   AB  1   Living  1      SAB  1   IAB      Ectopic      Multiple  0   Live Births  1           Family History  Adopted: Yes  Family history unknown: Yes    Social History   Tobacco Use   Smoking status: Former    Packs/day: 1.00    Pack years: 0.00    Types: Cigars, Cigarettes   Smokeless tobacco: Never  Vaping Use   Vaping Use: Never used  Substance Use Topics   Alcohol use: No   Drug use: No    Home Medications Prior to Admission medications   Medication  Sig Start Date End Date Taking? Authorizing Provider  albuterol (VENTOLIN HFA) 108 (90 Base) MCG/ACT inhaler Inhale 2 puffs into the lungs every 6 (six) hours as needed for wheezing or shortness of breath.  01/15/20 01/14/21  [provider]  carvedilol (COREG) 3.125 MG tablet Take 6.25 mg by mouth 2 (two) times daily with a meal.  04/13/20   [provider]  furosemide (LASIX) 40 MG tablet Take 1 tablet (40 mg total) by mouth daily. Patient taking differently: Take 20 mg by mouth 2 (two) times daily. 10/28/19   [name], MD  insulin aspart (NOVOLOG) 100 UNIT/ML injection Inject 2 Units into the skin 3 (three) times daily with meals. Patient taking differently: Inject 2-8 Units into the skin 3 (three) times daily with meals. Per sliding 10/30/19   Christophe Louis, MD  insulin detemir (LEVEMIR) 100 UNIT/ML injection Inject 0.08 mLs (8 Units total) into the skin daily. Patient taking  differently: Inject 10 Units into the skin daily. 10/31/19   Christophe Louis, MD  Insulin Syringe-Needle U-100 (INSULIN SYRINGE .3CC/31GX5/16") 31G X 5/16" 0.3 ML MISC Use as directed 10/27/19   [name], MD  potassium chloride (KLOR-CON) 10 MEQ tablet Take 1 tablet (10 mEq total) by mouth daily. 10/27/19   [name], MD  Vitamin D, Ergocalciferol, (DRISDOL) 1.25 MG (50000 UNIT) CAPS capsule Take 50,000 Units by mouth every 7 (seven) days.    [provider]  escitalopram (LEXAPRO) 10 MG tablet Take 20 mg by mouth daily.  05/10/20 10/06/20  [provider]  lisinopril (ZESTRIL) 10 MG tablet Take 10 mg by mouth daily.  02/23/20 10/06/20  [provider]  spironolactone (ALDACTONE) 25 MG tablet Take 25 mg by mouth daily.  04/20/20 10/06/20  [provider]    Allergies    Cantaloupe extract allergy skin test and Nsaids  Review of Systems   Review of Systems  Gastrointestinal:  Positive for vomiting.  All other systems reviewed and are negative.  Physical Exam Updated Vital Signs BP (!) 141/98   Pulse 87   Temp 98.1 F (36.7 C) (Oral)   Resp 12   SpO2 100%   Physical Exam Vitals and nursing note reviewed.  Constitutional:      General: She is not in acute distress.    Appearance: Normal appearance. She is well-developed.  HENT:     Head: Normocephalic and atraumatic.  Eyes:     Conjunctiva/sclera: Conjunctivae normal.     Pupils: Pupils are equal, round, and reactive to light.  Cardiovascular:     Rate and Rhythm: Normal rate and regular rhythm.     Heart sounds: Normal heart sounds.  Pulmonary:     Effort: Pulmonary effort is normal. No respiratory distress.     Breath sounds: Normal breath sounds.  Abdominal:     General: There is no distension.     Palpations: Abdomen is soft.     Tenderness: There is no abdominal tenderness.  Musculoskeletal:        General: No deformity. Normal range of motion.     Cervical back: Normal range of motion  and neck supple.  Skin:    General: Skin is warm and dry.  Neurological:     General: No focal deficit present.     Mental Status: She is alert and oriented to person, place, and time.    ED Results / Procedures / Treatments   Labs (all labs ordered are listed, but only abnormal results are displayed) Labs  Reviewed  BASIC METABOLIC PANEL - Abnormal; Notable for the following components:      Result Value   Sodium 134 (*)    Glucose, Bld 550 (*)    BUN 43 (*)    Creatinine, Ser 4.11 (*)    Calcium 8.0 (*)    GFR, Estimated 15 (*)    All other components within normal limits  CBC - Abnormal; Notable for the following components:   RBC 2.94 (*)    Hemoglobin 9.3 (*)    HCT 28.5 (*)    All other components within normal limits  CBG MONITORING, ED - Abnormal; Notable for the following components:   Glucose-Capillary 503 (*)    All other components within normal limits  CBG MONITORING, ED - Abnormal; Notable for the following components:   Glucose-Capillary 418 (*)    All other components within normal limits  RESP PANEL BY RT-PCR (FLU A&B, COVID) ARPGX2  URINALYSIS, ROUTINE W REFLEX MICROSCOPIC  I-STAT BETA HCG BLOOD, ED ([facility], [facility], [facility] ONLY)    EKG EKG Interpretation  Date/Time:  Wednesday February 16 2021 10:39:31 EDT Ventricular Rate:  87 PR Interval:  148 QRS Duration: 97 QT Interval:  407 QTC Calculation: 490 R Axis:   -3 Text Interpretation: Age not entered, assumed to be  25 years old for purpose of ECG interpretation Sinus rhythm Probable left atrial enlargement Abnormal T, consider ischemia, diffuse leads Confirmed by Dene Gentry [phone]) on 02/16/2021 11:31:22 AM  Radiology DG Chest Port 1 View  Result Date: 02/16/2021 CLINICAL DATA:  Dyspnea.  Shortness of breath and emesis. EXAM: PORTABLE CHEST 1 VIEW COMPARISON:  July 10, 2020. FINDINGS: Enlarged cardiac silhouette, similar to prior. Left basilar retrocardiac opacities with suggestion of air bronchograms. No  definite pleural effusions or pneumothorax on this single semi erect radiograph. No acute osseous abnormality. IMPRESSION: 1. Left basilar retrocardiac opacities, suspicious for pneumonia and/or aspiration. Dedicated PA and lateral radiographs could better characterize if clinically indicated. 2. Cardiomegaly. Electronically Signed   By: Margaretha Sheffield MD   On: 02/16/2021 11:52    Procedures Procedures   Medications Ordered in ED Medications  insulin regular, human (MYXREDLIN) 100 units/ 100 mL infusion (5.5 Units/hr Intravenous New Bag/Given 02/16/21 1213)  lactated ringers infusion (has no administration in time range)  dextrose 5 % in lactated ringers infusion (has no administration in time range)  dextrose 50 % solution 0-50 mL (has no administration in time range)  sodium chloride 0.9 % bolus 1,000 mL (1,000 mLs Intravenous New Bag/Given 02/16/21 0956)  ondansetron (ZOFRAN) injection 4 mg (4 mg Intravenous Given 02/16/21 1014)    ED Course  I have reviewed the triage vital signs and the nursing notes.  Pertinent labs & imaging results that were available during my care of the patient were reviewed by me and considered in my medical decision making (see chart for details).    MDM Rules/Calculators/A&P                          MDM  MSE complete  Dorna Pattan was evaluated in Emergency Department on 02/16/2021 for the symptoms described in the history of present illness. She was evaluated in the context of the global COVID-19 pandemic, which necessitated consideration that the patient might be at risk for infection with the SARS-CoV-2 virus that causes COVID-19. Institutional protocols and algorithms that pertain to the evaluation of patients at risk for COVID-19 are in a state of rapid change  based on information released by regulatory bodies including the CDC and federal and state organizations. These policies and algorithms were followed during the patient's care in the  ED.  Patient is presenting with hyperglycemia and associated nausea and vomiting.  Patient's work-up is concerning for elevated glucose in the 550 range.  Patient's creatinine is also elevated from baseline.  Anion gap on initial evaluation at 7.  Patient would benefit from admission for IV fluids and better glycemic control.  Hospitalist service is aware of case and will evaluate for admission. Final Clinical Impression(s) / ED Diagnoses Final diagnoses:  AKI (acute kidney injury) ([place])  Hyperglycemia    Rx / DC Orders ED Discharge Orders     None        Valarie Merino, MD 02/16/21 1230

## 2021-02-17 DIAGNOSIS — F3181 Bipolar II disorder: Secondary | ICD-10-CM

## 2021-02-17 DIAGNOSIS — E1165 Type 2 diabetes mellitus with hyperglycemia: Secondary | ICD-10-CM

## 2021-02-17 DIAGNOSIS — I10 Essential (primary) hypertension: Secondary | ICD-10-CM

## 2021-02-17 DIAGNOSIS — N184 Chronic kidney disease, stage 4 (severe): Secondary | ICD-10-CM

## 2021-02-17 DIAGNOSIS — E1100 Type 2 diabetes mellitus with hyperosmolarity without nonketotic hyperglycemic-hyperosmolar coma (NKHHC): Secondary | ICD-10-CM

## 2021-02-17 LAB — CBC
HCT: 24.8 % — ABNORMAL LOW (ref 36.0–46.0)
Hemoglobin: 8 g/dL — ABNORMAL LOW (ref 12.0–15.0)
MCH: 31.6 pg (ref 26.0–34.0)
MCHC: 32.3 g/dL (ref 30.0–36.0)
MCV: 98 fL (ref 80.0–100.0)
Platelets: 181 10*3/uL (ref 150–400)
RBC: 2.53 MIL/uL — ABNORMAL LOW (ref 3.87–5.11)
RDW: 11.9 % (ref 11.5–15.5)
WBC: 4.3 10*3/uL (ref 4.0–10.5)
nRBC: 0 % (ref 0.0–0.2)

## 2021-02-17 LAB — RENAL FUNCTION PANEL
Albumin: 2 g/dL — ABNORMAL LOW (ref 3.5–5.0)
Anion gap: 5 (ref 5–15)
BUN: 35 mg/dL — ABNORMAL HIGH (ref 6–20)
CO2: 24 mmol/L (ref 22–32)
Calcium: 8 mg/dL — ABNORMAL LOW (ref 8.9–10.3)
Chloride: 111 mmol/L (ref 98–111)
Creatinine, Ser: 3.79 mg/dL — ABNORMAL HIGH (ref 0.44–1.00)
GFR, Estimated: 16 mL/min — ABNORMAL LOW (ref >60)
Glucose, Bld: 154 mg/dL — ABNORMAL HIGH (ref 70–99)
Phosphorus: 4.2 mg/dL (ref 2.5–4.6)
Potassium: 4.4 mmol/L (ref 3.5–5.1)
Sodium: 140 mmol/L (ref 135–145)

## 2021-02-17 LAB — COMPREHENSIVE METABOLIC PANEL
ALT: 11 U/L (ref 0–44)
AST: 21 U/L (ref 15–41)
Albumin: 1.9 g/dL — ABNORMAL LOW (ref 3.5–5.0)
Alkaline Phosphatase: 87 U/L (ref 38–126)
Anion gap: 7 (ref 5–15)
BUN: 34 mg/dL — ABNORMAL HIGH (ref 6–20)
CO2: 22 mmol/L (ref 22–32)
Calcium: 8 mg/dL — ABNORMAL LOW (ref 8.9–10.3)
Chloride: 109 mmol/L (ref 98–111)
Creatinine, Ser: 3.75 mg/dL — ABNORMAL HIGH (ref 0.44–1.00)
GFR, Estimated: 16 mL/min — ABNORMAL LOW (ref >60)
Glucose, Bld: 154 mg/dL — ABNORMAL HIGH (ref 70–99)
Potassium: 4.3 mmol/L (ref 3.5–5.1)
Sodium: 138 mmol/L (ref 135–145)
Total Bilirubin: 0.5 mg/dL (ref 0.3–1.2)
Total Protein: 4.7 g/dL — ABNORMAL LOW (ref 6.5–8.1)

## 2021-02-17 LAB — GLUCOSE, CAPILLARY
Glucose-Capillary: 292 mg/dL — ABNORMAL HIGH (ref 70–99)
Glucose-Capillary: 90 mg/dL (ref 70–99)
Glucose-Capillary: 95 mg/dL (ref 70–99)
Glucose-Capillary: 109 mg/dL — ABNORMAL HIGH (ref 70–99)

## 2021-02-17 MED ORDER — HYDRALAZINE HCL 50 MG PO TABS
75.0000 mg | ORAL_TABLET | Freq: Three times a day (TID) | ORAL | Status: DC
  Administered 2021-02-17 – 2021-02-18 (×4): 75 mg via ORAL
  Filled 2021-02-17 (×5): qty 1

## 2021-02-17 MED ORDER — CARVEDILOL 12.5 MG PO TABS
12.5000 mg | ORAL_TABLET | Freq: Two times a day (BID) | ORAL | Status: DC
  Administered 2021-02-17 – 2021-02-18 (×2): 12.5 mg via ORAL
  Filled 2021-02-17 (×2): qty 1

## 2021-02-17 MED ORDER — BENZONATATE 100 MG PO CAPS
100.0000 mg | ORAL_CAPSULE | Freq: Three times a day (TID) | ORAL | Status: DC
  Administered 2021-02-17 – 2021-02-18 (×4): 100 mg via ORAL
  Filled 2021-02-17 (×4): qty 1

## 2021-02-17 MED ORDER — ACETAMINOPHEN 500 MG PO TABS
1000.0000 mg | ORAL_TABLET | Freq: Four times a day (QID) | ORAL | Status: DC | PRN
  Administered 2021-02-17: 1000 mg via ORAL
  Filled 2021-02-17: qty 2

## 2021-02-17 MED ORDER — ISOSORBIDE MONONITRATE ER 30 MG PO TB24
30.0000 mg | ORAL_TABLET | Freq: Every day | ORAL | Status: DC
  Administered 2021-02-17 – 2021-02-18 (×2): 30 mg via ORAL
  Filled 2021-02-17 (×2): qty 1

## 2021-02-17 MED ORDER — OXYCODONE HCL 5 MG PO TABS
5.0000 mg | ORAL_TABLET | ORAL | Status: DC | PRN
  Administered 2021-02-17: 5 mg via ORAL
  Filled 2021-02-17: qty 1

## 2021-02-17 MED ORDER — SODIUM CHLORIDE 0.9 % IV SOLN: INTRAVENOUS | Status: DC

## 2021-02-17 MED ORDER — BUTALBITAL-APAP-CAFFEINE 50-325-40 MG PO TABS
1.0000 | ORAL_TABLET | Freq: Once | ORAL | Status: AC
  Administered 2021-02-17: 1 via ORAL
  Filled 2021-02-17: qty 1

## 2021-02-17 NOTE — Progress Notes (Addendum)
Inpatient Diabetes Program Recommendations  AACE/ADA: New Consensus Statement on Inpatient Glycemic Control (2015)  Target Ranges:  Prepandial:   less than 140 mg/dL      Peak postprandial:   less than 180 mg/dL (1-2 hours)      Critically ill patients:  140 - 180 mg/dL   Lab Results  Component Value Date   GLUCAP 292 (H) 02/17/2021   HGBA1C 8.6 (H) 02/16/2021    Review of Glycemic Control Results for Kathy Lewis, Kathy Lewis (MRN [id number]) as of 02/17/2021 09:50  Ref. Range 02/16/2021 19:32 02/16/2021 21:19 02/16/2021 22:46 02/16/2021 23:50 02/17/2021 08:02  Glucose-Capillary Latest Ref Range: 70 - 99 mg/dL 115 (H) 115 (H) 145 (H) 144 (H) 292 (H)   Diabetes history: DM 2 Outpatient Diabetes medications:  Novolog 2-8 units tid with meals, Levemir 10 units bid Current orders for Inpatient glycemic control:  Novolog sensitive tid with meals and HS Novolog 4 units tid with meals  Inpatient Diabetes Program Recommendations:    Consider reducing Novolog to 2 units tid with meals.   Also consider adding Levemir 6 units bid.  Patient received Lantus 10 units x1 last night.    Thanks,  Adah Perl, RN, BC-ADM Inpatient Diabetes Coordinator Pager [phone]  (8a-5p)  Addendum [phone]- spoke with patient regarding DM and potential reason for admission/high blood sugars. She states that she was feeling poorly and did not take insulin or check blood sugars regularly.  Discussed importance of more frequent monitoring.  We discussed her current A1C of 8.6%.  She states this is improved as last A1C was in 11% range.  She has tried CGM in the past, but states she did not like it and prefers to do finger sticks.  She see's Dr. Chalmers Cater and states she has no needs related to her DM at this time. Will follow.

## 2021-02-17 NOTE — Plan of Care (Signed)
PLAN of care reviewed with patient. Pt expressed an understanding and is comfortable with implementation of care

## 2021-02-17 NOTE — Progress Notes (Signed)
Progress Note    Kathy Lewis   DXA:[id number]  DOB: 1995-12-01  DOA: 02/16/2021     1  PCP: Medicine, [facility]  CC: N/V  Hospital Course: Kathy Lewis is a 25 y.o. female with medical history significant of CKD4, bipolar, T1DM, HFrEF. Presenting with N/V. She reports that 5 days ago, she had a cough and runny nose. It persisted through Monday, so she went to the ED. She was diagnosed w/ a viral URI and sent home on tessalon and sudafed.  Her symptoms stayed about the same until this morning. This morning she started having vomiting episodes and severe headache. There was no blood in her vomit. She didn't have any fevers. She felt as if she may be going into DKA, so she came to the ED. Of note, she reports that she's compliant on her insulin as well as her other medications. She also reports that she's had a decreased appetite and poor fluid intake over the past several days.     She was noted to have an AKI. Her glucose was 550. She was started on insulin gtt and fluids. She responded well to treatment and was transitioned off of insulin drip to subcutaneous insulin.  Interval History:  Symptoms much improved this morning.  Tolerating some food now and reviewed labs with her.  We will continue fluids and monitor renal function overnight and hopeful for discharge home tomorrow.  ROS: Constitutional: negative for chills and fevers, Respiratory: negative for cough and sputum, Cardiovascular: negative for chest pain, and Gastrointestinal: negative for abdominal pain  Assessment & Plan: * Hyperosmolar hyperglycemic state (HHS) ([place]) - probable HHS on admission 2/2 N/V -Responded well to insulin drip and fluids - Now has been transitioned back to subcutaneous insulin - Okay to transfer out of SDU  Acute renal failure superimposed on stage 4 chronic kidney disease ([place]) - patient has history of CKD4. Baseline creat ~ 2.3 - 2.6, eGFR 25-29 - patient presents with  increase in creat >0.3 mg/dL above baseline, creat increase >1.5x baseline presumed to have occurred within past 7 days PTA -Creatinine 4.11 on admission.  Etiology presumed due to volume depletion/dehydration from GI losses of nausea/vomiting as well as ongoing diuretic use with torsemide - Hold torsemide - Continue fluids and keep in hospital at least 1 more night to monitor renal response.  Renal function has been improving so far  HTN (hypertension) - Continue Coreg, Imdur, and hydralazine - Hold losartan  Bipolar 2 disorder (HCC) - continue home regimen   Normocytic anemia - baseline Hgb 8-9 g/dL - currently at baseline   Chronic systolic CHF (congestive heart failure) (HCC) - no s/s exacerbation  - resume diretics when able; currently on hold   Nausea and vomiting - advance diet; improving    Old records reviewed in assessment of this patient  Antimicrobials: N/A  DVT prophylaxis: enoxaparin (LOVENOX) injection 30 mg Start: 02/16/21 2200   Code Status:   Code Status: Full Code Family Communication:   Disposition Plan: Status is: Inpatient  Remains inpatient appropriate because:IV treatments appropriate due to intensity of illness or inability to take PO and Inpatient level of care appropriate due to severity of illness  Dispo: The patient is from: Home              Anticipated d/c is to: Home              Patient currently is not medically stable to d/c.   Difficult to place  patient No      Risk of unplanned readmission score: Unplanned Admission- Pilot do not use: 29.91   Objective: Blood pressure (!) 129/92, pulse 94, temperature 98.1 F (36.7 C), temperature source Oral, resp. rate 16, height 5' (1.524 m), weight 60.1 kg, SpO2 98 %, currently breastfeeding.  Examination: General appearance: alert, cooperative, and no distress Head: Normocephalic, without obvious abnormality, atraumatic Eyes:  EOMI Lungs: clear to auscultation bilaterally Heart:  regular rate and rhythm and S1, S2 normal Abdomen: normal findings: bowel sounds normal and soft, non-tender Extremities:  no edema Skin: mobility and turgor normal Neurologic: Grossly normal  Consultants:  N/a  Procedures:  N/a  Data Reviewed: I have personally reviewed following labs and imaging studies Results for orders placed or performed during the hospital encounter of 02/16/21 (from the past 24 hour(s))  Glucose, capillary     Status: Abnormal   Collection Time: 02/16/21  3:20 PM  Result Value Ref Range   Glucose-Capillary 207 (H) 70 - 99 mg/dL  Glucose, capillary     Status: Abnormal   Collection Time: 02/16/21  4:25 PM  Result Value Ref Range   Glucose-Capillary 166 (H) 70 - 99 mg/dL  Urinalysis, Routine w reflex microscopic Urine, Clean Catch     Status: Abnormal   Collection Time: 02/16/21  4:30 PM  Result Value Ref Range   Color, Urine YELLOW YELLOW   APPearance HAZY (A) CLEAR   Specific Gravity, Urine 1.017 1.005 - 1.030   pH 6.0 5.0 - 8.0   Glucose, UA >=500 (A) NEGATIVE mg/dL   Hgb urine dipstick SMALL (A) NEGATIVE   Bilirubin Urine NEGATIVE NEGATIVE   Ketones, ur NEGATIVE NEGATIVE mg/dL   Protein, ur 100 (A) NEGATIVE mg/dL   Nitrite NEGATIVE NEGATIVE   Leukocytes,Ua NEGATIVE NEGATIVE   RBC / HPF 11-20 0 - 5 RBC/hpf   WBC, UA 0-5 0 - 5 WBC/hpf   Bacteria, UA RARE (A) NONE SEEN   Squamous Epithelial / LPF 0-5 0 - 5  Renal function panel     Status: Abnormal   Collection Time: 02/16/21  5:19 PM  Result Value Ref Range   Sodium 138 135 - 145 mmol/L   Potassium 4.5 3.5 - 5.1 mmol/L   Chloride 109 98 - 111 mmol/L   CO2 25 22 - 32 mmol/L   Glucose, Bld 149 (H) 70 - 99 mg/dL   BUN 39 (H) 6 - 20 mg/dL   Creatinine, Ser 3.80 (H) 0.44 - 1.00 mg/dL   Calcium 7.8 (L) 8.9 - 10.3 mg/dL   Phosphorus 4.2 2.5 - 4.6 mg/dL   Albumin 2.1 (L) 3.5 - 5.0 g/dL   GFR, Estimated 16 (L) >60 mL/min   Anion gap 4 (L) 5 - 15  Glucose, capillary     Status: Abnormal    Collection Time: 02/16/21  5:21 PM  Result Value Ref Range   Glucose-Capillary 159 (H) 70 - 99 mg/dL  Glucose, capillary     Status: Abnormal   Collection Time: 02/16/21  6:21 PM  Result Value Ref Range   Glucose-Capillary 129 (H) 70 - 99 mg/dL  Glucose, capillary     Status: Abnormal   Collection Time: 02/16/21  7:32 PM  Result Value Ref Range   Glucose-Capillary 115 (H) 70 - 99 mg/dL  Renal function panel     Status: Abnormal   Collection Time: 02/16/21  9:07 PM  Result Value Ref Range   Sodium 139 135 - 145 mmol/L   Potassium 4.6  3.5 - 5.1 mmol/L   Chloride 110 98 - 111 mmol/L   CO2 26 22 - 32 mmol/L   Glucose, Bld 118 (H) 70 - 99 mg/dL   BUN 37 (H) 6 - 20 mg/dL   Creatinine, Ser 3.83 (H) 0.44 - 1.00 mg/dL   Calcium 7.8 (L) 8.9 - 10.3 mg/dL   Phosphorus 4.0 2.5 - 4.6 mg/dL   Albumin 1.9 (L) 3.5 - 5.0 g/dL   GFR, Estimated 16 (L) >60 mL/min   Anion gap 3 (L) 5 - 15  Beta-hydroxybutyric acid     Status: None   Collection Time: 02/16/21  9:07 PM  Result Value Ref Range   Beta-Hydroxybutyric Acid 0.21 0.05 - 0.27 mmol/L  Glucose, capillary     Status: Abnormal   Collection Time: 02/16/21  9:19 PM  Result Value Ref Range   Glucose-Capillary 115 (H) 70 - 99 mg/dL  Glucose, capillary     Status: Abnormal   Collection Time: 02/16/21 10:46 PM  Result Value Ref Range   Glucose-Capillary 145 (H) 70 - 99 mg/dL  Glucose, capillary     Status: Abnormal   Collection Time: 02/16/21 11:50 PM  Result Value Ref Range   Glucose-Capillary 144 (H) 70 - 99 mg/dL  Renal function panel     Status: Abnormal   Collection Time: 02/17/21  1:14 AM  Result Value Ref Range   Sodium 140 135 - 145 mmol/L   Potassium 4.4 3.5 - 5.1 mmol/L   Chloride 111 98 - 111 mmol/L   CO2 24 22 - 32 mmol/L   Glucose, Bld 154 (H) 70 - 99 mg/dL   BUN 35 (H) 6 - 20 mg/dL   Creatinine, Ser 3.79 (H) 0.44 - 1.00 mg/dL   Calcium 8.0 (L) 8.9 - 10.3 mg/dL   Phosphorus 4.2 2.5 - 4.6 mg/dL   Albumin 2.0 (L) 3.5 - 5.0  g/dL   GFR, Estimated 16 (L) >60 mL/min   Anion gap 5 5 - 15  Comprehensive metabolic panel     Status: Abnormal   Collection Time: 02/17/21  1:14 AM  Result Value Ref Range   Sodium 138 135 - 145 mmol/L   Potassium 4.3 3.5 - 5.1 mmol/L   Chloride 109 98 - 111 mmol/L   CO2 22 22 - 32 mmol/L   Glucose, Bld 154 (H) 70 - 99 mg/dL   BUN 34 (H) 6 - 20 mg/dL   Creatinine, Ser 3.75 (H) 0.44 - 1.00 mg/dL   Calcium 8.0 (L) 8.9 - 10.3 mg/dL   Total Protein 4.7 (L) 6.5 - 8.1 g/dL   Albumin 1.9 (L) 3.5 - 5.0 g/dL   AST 21 15 - 41 U/L   ALT 11 0 - 44 U/L   Alkaline Phosphatase 87 38 - 126 U/L   Total Bilirubin 0.5 0.3 - 1.2 mg/dL   GFR, Estimated 16 (L) >60 mL/min   Anion gap 7 5 - 15  CBC     Status: Abnormal   Collection Time: 02/17/21  1:14 AM  Result Value Ref Range   WBC 4.3 4.0 - 10.5 K/uL   RBC 2.53 (L) 3.87 - 5.11 MIL/uL   Hemoglobin 8.0 (L) 12.0 - 15.0 g/dL   HCT 24.8 (L) 36.0 - 46.0 %   MCV 98.0 80.0 - 100.0 fL   MCH 31.6 26.0 - 34.0 pg   MCHC 32.3 30.0 - 36.0 g/dL   RDW 11.9 11.5 - 15.5 %   Platelets 181 150 - 400 K/uL  nRBC 0.0 0.0 - 0.2 %  Glucose, capillary     Status: Abnormal   Collection Time: 02/17/21  8:02 AM  Result Value Ref Range   Glucose-Capillary 292 (H) 70 - 99 mg/dL  Glucose, capillary     Status: None   Collection Time: 02/17/21 11:55 AM  Result Value Ref Range   Glucose-Capillary 90 70 - 99 mg/dL    Recent Results (from the past 240 hour(s))  Resp Panel by RT-PCR (Flu A&B, Covid) Nasopharyngeal Swab     Status: None   Collection Time: 02/16/21 10:17 AM   Specimen: Nasopharyngeal Swab; Nasopharyngeal(NP) swabs in vial transport medium  Result Value Ref Range Status   SARS Coronavirus 2 by RT PCR NEGATIVE NEGATIVE Final    Comment: (NOTE) SARS-CoV-2 target nucleic acids are NOT DETECTED.  The SARS-CoV-2 RNA is generally detectable in upper respiratory specimens during the acute phase of infection. The lowest concentration of SARS-CoV-2 viral  copies this assay can detect is 138 copies/mL. A negative result does not preclude SARS-Cov-2 infection and should not be used as the sole basis for treatment or other patient management decisions. A negative result may occur with  improper specimen collection/handling, submission of specimen other than nasopharyngeal swab, presence of viral mutation(s) within the areas targeted by this assay, and inadequate number of viral copies(<138 copies/mL). A negative result must be combined with clinical observations, patient history, and epidemiological information. The expected result is Negative.  Fact Sheet for Patients:  EntrepreneurPulse.com.au  Fact Sheet for Healthcare Providers:  IncredibleEmployment.be  This test is no t yet approved or cleared by the [place] FDA and  has been authorized for detection and/or diagnosis of SARS-CoV-2 by FDA under an Emergency Use Authorization (EUA). This EUA will remain  in effect (meaning this test can be used) for the duration of the COVID-19 declaration under Section 564(b)(1) of the Act, 21 U.S.C.section 360bbb-3(b)(1), unless the authorization is terminated  or revoked sooner.       Influenza A by PCR NEGATIVE NEGATIVE Final   Influenza B by PCR NEGATIVE NEGATIVE Final    Comment: (NOTE) The Xpert Xpress SARS-CoV-2/FLU/RSV plus assay is intended as an aid in the diagnosis of influenza from Nasopharyngeal swab specimens and should not be used as a sole basis for treatment. Nasal washings and aspirates are unacceptable for Xpert Xpress SARS-CoV-2/FLU/RSV testing.  Fact Sheet for Patients: EntrepreneurPulse.com.au  Fact Sheet for Healthcare Providers: IncredibleEmployment.be  This test is not yet approved or cleared by the [place] FDA and has been authorized for detection and/or diagnosis of SARS-CoV-2 by FDA under an Emergency Use Authorization (EUA). This  EUA will remain in effect (meaning this test can be used) for the duration of the COVID-19 declaration under Section 564(b)(1) of the Act, 21 U.S.C. section 360bbb-3(b)(1), unless the authorization is terminated or revoked.  Performed at [facility], [place] [address], [place], [place] [address]   MRSA PCR Screening     Status: None   Collection Time: 02/16/21  1:30 PM  Result Value Ref Range Status   MRSA by PCR NEGATIVE NEGATIVE Final    Comment:        The GeneXpert MRSA Assay (FDA approved for NASAL specimens only), is one component of a comprehensive MRSA colonization surveillance program. It is not intended to diagnose MRSA infection nor to guide or monitor treatment for MRSA infections. Performed at [facility], [place] [address], [place], [place] [address]  Radiology Studies: DG Chest 2 View  Result Date: 02/16/2021 CLINICAL DATA:  Shortness of breath. EXAM: CHEST - 2 VIEW COMPARISON:  Earlier same day.  02/16/2021 FINDINGS: Streaky opacity at the lung bases is similar to prior, likely chronic atelectasis or scarring. Cannot exclude pneumonia in the retrocardiac left base. No edema or focal airspace consolidation. No pleural effusion. The cardio pericardial silhouette is enlarged. The visualized bony structures of the thorax show no acute abnormality. Telemetry leads overlie the chest. IMPRESSION: Chronic bibasilar atelectasis or scarring. Superimposed pneumonia at the left base cannot be excluded. Electronically Signed   By: Misty Stanley M.D.   On: 02/16/2021 16:11   US RENAL  Result Date: 02/16/2021 CLINICAL DATA:  Acute kidney injury EXAM: RENAL / URINARY TRACT ULTRASOUND COMPLETE COMPARISON:  None. FINDINGS: Right Kidney: Renal measurements: 11.8 x 5.8 x 5.6 cm = volume: 202 mL. Echogenicity within normal limits. No mass or hydronephrosis visualized. Left Kidney: Renal measurements: 10.0 x 5.6 x 5.5 cm = volume: 159 mL. Echogenicity  within normal limits. No mass or hydronephrosis visualized. Bladder: Appears normal for degree of bladder distention. Other: Trace ascites. IMPRESSION: 1. Normal ultrasound examination of the kidneys.  No hydronephrosis. 2. Trace ascites. Electronically Signed   By: Eddie Candle M.D.   On: 02/16/2021 14:30   DG Chest Port 1 View  Result Date: 02/16/2021 CLINICAL DATA:  Dyspnea.  Shortness of breath and emesis. EXAM: PORTABLE CHEST 1 VIEW COMPARISON:  July 10, 2020. FINDINGS: Enlarged cardiac silhouette, similar to prior. Left basilar retrocardiac opacities with suggestion of air bronchograms. No definite pleural effusions or pneumothorax on this single semi erect radiograph. No acute osseous abnormality. IMPRESSION: 1. Left basilar retrocardiac opacities, suspicious for pneumonia and/or aspiration. Dedicated PA and lateral radiographs could better characterize if clinically indicated. 2. Cardiomegaly. Electronically Signed   By: Margaretha Sheffield MD   On: 02/16/2021 11:52   DG Chest 2 View  Final Result    US RENAL  Final Result    DG Chest Port 1 View  Final Result      Scheduled Meds:  benzonatate  100 mg Oral TID   carvedilol  12.5 mg Oral BID WC   Chlorhexidine Gluconate Cloth  6 each Topical Daily   enoxaparin (LOVENOX) injection  30 mg Subcutaneous Q24H   hydrALAZINE  75 mg Oral TID   insulin aspart  0-5 Units Subcutaneous QHS   insulin aspart  0-9 Units Subcutaneous TID WC   isosorbide mononitrate  30 mg Oral Daily   PRN Meds: acetaminophen, hydrALAZINE, prochlorperazine Continuous Infusions:  sodium chloride       LOS: 1 day  Time spent: Greater than 50% of the 35 minute visit was spent in counseling/coordination of care for the patient as laid out in the A&P.   Dwyane Dee, MD [facility] 02/17/2021, 2:35 PM

## 2021-02-17 NOTE — Assessment & Plan Note (Addendum)
-   probable HHS on admission 2/2 N/V -Responded well to insulin drip and fluids - Now has been transitioned back to subcutaneous insulin

## 2021-02-17 NOTE — Assessment & Plan Note (Addendum)
-  patient has history of CKD4. Baseline creat ~ 2.3 - 2.6, eGFR 25-29 - patient presents with increase in creat >0.3 mg/dL above baseline, creat increase >1.5x baseline presumed to have occurred within past 7 days PTA -Creatinine 4.11 on admission.  Etiology presumed due to volume depletion/dehydration from GI losses of nausea/vomiting as well as ongoing diuretic use with torsemide -Renal function improving at discharge.  Will need repeat BMP at follow-up

## 2021-02-17 NOTE — Hospital Course (Signed)
Kathy Lewis is a 25 y.o. female with medical history significant of CKD4, bipolar, T1DM, HFrEF. Presenting with N/V. She reports that 5 days ago, she had a cough and runny nose. It persisted through Monday, so she went to the ED. She was diagnosed w/ a viral URI and sent home on tessalon and sudafed.  Her symptoms stayed about the same until this morning. This morning she started having vomiting episodes and severe headache. There was no blood in her vomit. She didn't have any fevers. She felt as if she may be going into DKA, so she came to the ED. Of note, she reports that she's compliant on her insulin as well as her other medications. She also reports that she's had a decreased appetite and poor fluid intake over the past several days.     She was noted to have an AKI. Her glucose was 550. She was started on insulin gtt and fluids. She responded well to treatment and was transitioned off of insulin drip to subcutaneous insulin.

## 2021-02-17 NOTE — Assessment & Plan Note (Addendum)
-   Continue Coreg, Imdur, and hydralazine - resumed losartan at d/c

## 2021-02-17 NOTE — Assessment & Plan Note (Signed)
-   advance diet; improving

## 2021-02-17 NOTE — Assessment & Plan Note (Signed)
-   continue home regimen  

## 2021-02-17 NOTE — Assessment & Plan Note (Signed)
-   baseline Hgb 8-9 g/dL - currently at baseline

## 2021-02-17 NOTE — Assessment & Plan Note (Addendum)
-   no s/s exacerbation  - resume home regimen at discharge

## 2021-02-18 LAB — BASIC METABOLIC PANEL
Anion gap: 7 (ref 5–15)
BUN: 30 mg/dL — ABNORMAL HIGH (ref 6–20)
CO2: 21 mmol/L — ABNORMAL LOW (ref 22–32)
Calcium: 7.6 mg/dL — ABNORMAL LOW (ref 8.9–10.3)
Chloride: 111 mmol/L (ref 98–111)
Creatinine, Ser: 3.65 mg/dL — ABNORMAL HIGH (ref 0.44–1.00)
GFR, Estimated: 17 mL/min — ABNORMAL LOW (ref >60)
Glucose, Bld: 98 mg/dL (ref 70–99)
Potassium: 4.2 mmol/L (ref 3.5–5.1)
Sodium: 139 mmol/L (ref 135–145)

## 2021-02-18 LAB — MAGNESIUM: Magnesium: 1.6 mg/dL — ABNORMAL LOW (ref 1.7–2.4)

## 2021-02-18 LAB — GLUCOSE, CAPILLARY: Glucose-Capillary: 90 mg/dL (ref 70–99)

## 2021-02-18 MED ORDER — BUTALBITAL-APAP-CAFFEINE 50-325-40 MG PO TABS: 1.0000 | ORAL_TABLET | Freq: Every day | ORAL | 0 refills | Status: AC | PRN

## 2021-02-18 NOTE — Plan of Care (Signed)
Pt c/o migraine headache, Rx Fioricet ordered; pt reports relief and was able to fall asleep; no s/s of acute distress or pain reported or observed; call light and cell phone within reach and bed at lowest position for safety.

## 2021-02-19 NOTE — Discharge Summary (Signed)
Physician Discharge Summary   Kathy Lewis IOE:[id number] DOB: July 13, 1996 DOA: 02/16/2021  PCP: [facility], [place] date: 02/16/2021 Discharge date: 02/18/2021   Admitted From: home Disposition:  home Discharging physician: Dwyane Dee, MD  Recommendations for Outpatient Follow-up:  Repeat BMP Follow up BP control  Home Health:  Equipment/Devices:   Patient discharged to home in Discharge Condition: stable Risk of unplanned readmission score: Unplanned Admission- [place] do not use: 32.59  CODE STATUS: Full Diet recommendation:  Diet Orders (From admission, onward)     Start     Ordered   02/18/21 0000  Diet - low sodium heart healthy        02/18/21 1043   02/18/21 0000  Diet Carb Modified        02/18/21 1043            Hospital Course: Kathy Lewis is a 25 y.o. female with medical history significant of CKD4, bipolar, T1DM, HFrEF. Presenting with N/V. She reports that 5 days ago, she had a cough and runny nose. It persisted through Monday, so she went to the ED. She was diagnosed w/ a viral URI and sent home on tessalon and sudafed.  Her symptoms stayed about the same until this morning. This morning she started having vomiting episodes and severe headache. There was no blood in her vomit. She didn't have any fevers. She felt as if she may be going into DKA, so she came to the ED. Of note, she reports that she's compliant on her insulin as well as her other medications. She also reports that she's had a decreased appetite and poor fluid intake over the past several days.     She was noted to have an AKI. Her glucose was 550. She was started on insulin gtt and fluids. She responded well to treatment and was transitioned off of insulin drip to subcutaneous insulin.  * Hyperosmolar hyperglycemic state (HHS) (HCC)-resolved as of 02/19/2021 - probable HHS on admission 2/2 N/V -Responded well to insulin drip and fluids - Now has been transitioned  back to subcutaneous insulin  Acute renal failure superimposed on stage 4 chronic kidney disease ([place]) - patient has history of CKD4. Baseline creat ~ 2.3 - 2.6, eGFR 25-29 - patient presents with increase in creat >0.3 mg/dL above baseline, creat increase >1.5x baseline presumed to have occurred within past 7 days PTA -Creatinine 4.11 on admission.  Etiology presumed due to volume depletion/dehydration from GI losses of nausea/vomiting as well as ongoing diuretic use with torsemide -Renal function improving at discharge.  Will need repeat BMP at follow-up  HTN (hypertension) - Continue Coreg, Imdur, and hydralazine - resumed losartan at d/c  Bipolar 2 disorder ([place]) - continue home regimen   Normocytic anemia - baseline Hgb 8-9 g/dL - currently at baseline   Chronic systolic CHF (congestive heart failure) (HCC) - no s/s exacerbation  - resume home regimen at discharge  Nausea and vomiting - advance diet; improving    The patient's chronic medical conditions were treated accordingly per the patient's home medication regimen except as noted.  On day of discharge, patient was felt deemed stable for discharge. Patient/family member advised to call PCP or come back to ER if needed.   Principal Diagnosis: Hyperosmolar hyperglycemic state (HHS) [facility])  Discharge Diagnoses: Active Hospital Problems   Diagnosis Date Noted   Acute renal failure superimposed on stage 4 chronic kidney disease ([place]) 02/16/2021    Priority: High   Bipolar 2 disorder ([place])  HTN (hypertension)    Chronic systolic CHF (congestive heart failure) (HCC) 07/11/2020   Normocytic anemia    Nausea and vomiting 12/18/2014    Resolved Hospital Problems   Diagnosis Date Noted Date Resolved   Hyperosmolar hyperglycemic state (HHS) [facility]) 02/17/2021 02/19/2021    Priority: High    Discharge Instructions     Diet - low sodium heart healthy   Complete by: As directed    Diet Carb Modified   Complete by: As  directed    Increase activity slowly   Complete by: As directed       Allergies as of 02/18/2021       Reactions   Cantaloupe Extract Allergy Skin Test Itching   Mouth itching   Strawberry Extract    Other reaction(s): Itching Mouth itches Mouth itches   Citrullus Vulgaris Other (See Comments)   Makes mouth itch , ALL melons Makes mouth itch , ALL melons   Nsaids    Avoid per nephrology        Medication List     TAKE these medications    acetaminophen 500 MG tablet Commonly known as: TYLENOL Take 1,000 mg by mouth every 6 (six) hours as needed for moderate pain.   albuterol 108 (90 Base) MCG/ACT inhaler Commonly known as: VENTOLIN HFA Inhale 2 puffs into the lungs every 6 (six) hours as needed for wheezing or shortness of breath.   benzonatate 100 MG capsule Commonly known as: TESSALON Take 100 mg by mouth 3 (three) times daily.   butalbital-acetaminophen-caffeine 50-325-40 MG tablet Commonly known as: FIORICET Take 1 tablet by mouth daily as needed for headache.   carvedilol 12.5 MG tablet Commonly known as: COREG Take 12.5 mg by mouth 2 (two) times daily with a meal.   hydrALAZINE 25 MG tablet Commonly known as: APRESOLINE Take 75 mg by mouth 3 (three) times daily.   INSULIN SYRINGE .3CC/31GX5/16" 31G X 5/16" 0.3 ML Misc Use as directed   isosorbide mononitrate 30 MG 24 hr tablet Commonly known as: IMDUR Take 30 mg by mouth daily.   losartan 25 MG tablet Commonly known as: COZAAR Take 25 mg by mouth daily.   potassium chloride 10 MEQ tablet Commonly known as: KLOR-CON Take 1 tablet (10 mEq total) by mouth daily.   torsemide 20 MG tablet Commonly known as: DEMADEX Take 20-40 mg by mouth See admin instructions. Takes 2 tablets in the morning and 1 tablet at lunch   Vitamin D (Ergocalciferol) 1.25 MG (50000 UNIT) Caps capsule Commonly known as: DRISDOL Take 50,000 Units by mouth every 7 (seven) days.       ASK your doctor about these  medications    insulin aspart 100 UNIT/ML injection Commonly known as: novoLOG Inject 2 Units into the skin 3 (three) times daily with meals.   insulin detemir 100 UNIT/ML injection Commonly known as: LEVEMIR Inject 0.08 mLs (8 Units total) into the skin daily.        Follow-up Information     Medicine, [facility]. Schedule an appointment as soon as possible for a visit in 2 week(s).   Specialty: Family Medicine Why: Check BMP at follow up               Allergies  Allergen Reactions   Cantaloupe Extract Allergy Skin Test Itching    Mouth itching     Strawberry Extract     Other reaction(s): Itching Mouth itches Mouth itches   Citrullus Vulgaris Other (See Comments)  Makes mouth itch , ALL melons Makes mouth itch , ALL melons   Nsaids     Avoid per nephrology    Consultations:   Discharge Exam: BP 131/78 (BP Location: Left Arm)   Pulse 89   Temp 98.7 F (37.1 C) (Oral)   Resp 17   Ht 5' (1.524 m)   Wt 60.1 kg   SpO2 98%   BMI 25.88 kg/m  General appearance: alert, cooperative, and no distress Head: Normocephalic, without obvious abnormality, atraumatic Eyes:  EOMI Lungs: clear to auscultation bilaterally Heart: regular rate and rhythm and S1, S2 normal Abdomen: normal findings: bowel sounds normal and soft, non-tender Extremities:  no edema Skin: mobility and turgor normal Neurologic: Grossly normal  The results of significant diagnostics from this hospitalization (including imaging, microbiology, ancillary and laboratory) are listed below for reference.   Microbiology: Recent Results (from the past 240 hour(s))  Resp Panel by RT-PCR (Flu A&B, Covid) Nasopharyngeal Swab     Status: None   Collection Time: 02/16/21 10:17 AM   Specimen: Nasopharyngeal Swab; Nasopharyngeal(NP) swabs in vial transport medium  Result Value Ref Range Status   SARS Coronavirus 2 by RT PCR NEGATIVE NEGATIVE Final    Comment: (NOTE) SARS-CoV-2  target nucleic acids are NOT DETECTED.  The SARS-CoV-2 RNA is generally detectable in upper respiratory specimens during the acute phase of infection. The lowest concentration of SARS-CoV-2 viral copies this assay can detect is 138 copies/mL. A negative result does not preclude SARS-Cov-2 infection and should not be used as the sole basis for treatment or other patient management decisions. A negative result may occur with  improper specimen collection/handling, submission of specimen other than nasopharyngeal swab, presence of viral mutation(s) within the areas targeted by this assay, and inadequate number of viral copies(<138 copies/mL). A negative result must be combined with clinical observations, patient history, and epidemiological information. The expected result is Negative.  Fact Sheet for Patients:  EntrepreneurPulse.com.au  Fact Sheet for Healthcare Providers:  IncredibleEmployment.be  This test is no t yet approved or cleared by the [place] FDA and  has been authorized for detection and/or diagnosis of SARS-CoV-2 by FDA under an Emergency Use Authorization (EUA). This EUA will remain  in effect (meaning this test can be used) for the duration of the COVID-19 declaration under Section 564(b)(1) of the Act, 21 U.S.C.section 360bbb-3(b)(1), unless the authorization is terminated  or revoked sooner.       Influenza A by PCR NEGATIVE NEGATIVE Final   Influenza B by PCR NEGATIVE NEGATIVE Final    Comment: (NOTE) The Xpert Xpress SARS-CoV-2/FLU/RSV plus assay is intended as an aid in the diagnosis of influenza from Nasopharyngeal swab specimens and should not be used as a sole basis for treatment. Nasal washings and aspirates are unacceptable for Xpert Xpress SARS-CoV-2/FLU/RSV testing.  Fact Sheet for Patients: EntrepreneurPulse.com.au  Fact Sheet for Healthcare  Providers: IncredibleEmployment.be  This test is not yet approved or cleared by the [place] FDA and has been authorized for detection and/or diagnosis of SARS-CoV-2 by FDA under an Emergency Use Authorization (EUA). This EUA will remain in effect (meaning this test can be used) for the duration of the COVID-19 declaration under Section 564(b)(1) of the Act, 21 U.S.C. section 360bbb-3(b)(1), unless the authorization is terminated or revoked.  Performed at [facility], [place] [address] [place], [place] [address]   MRSA PCR Screening     Status: None   Collection Time: 02/16/21  1:30 PM  Result  Value Ref Range Status   MRSA by PCR NEGATIVE NEGATIVE Final    Comment:        The GeneXpert MRSA Assay (FDA approved for NASAL specimens only), is one component of a comprehensive MRSA colonization surveillance program. It is not intended to diagnose MRSA infection nor to guide or monitor treatment for MRSA infections. Performed at [facility], [place] [address], [place], [place] [address]      Labs: BNP (last 3 results) Recent Labs    06/14/20 0146 06/27/20 2059 07/10/20 1706  BNP 962.5* 1,011.8* 3,244.0*   Basic Metabolic Panel: Recent Labs  Lab 02/16/21 1318 02/16/21 1719 02/16/21 2107 02/17/21 0114 02/18/21 0505  NA 132* 138 139 140  138 139  K 4.4 4.5 4.6 4.4  4.3 4.2  CL 104 109 110 111  109 111  CO2 $Re'22 25 26 24  22 'TfG$ 21*  GLUCOSE 555* 149* 118* 154*  154* 98  BUN 42* 39* 37* 35*  34* 30*  CREATININE 4.22* 3.80* 3.83* 3.79*  3.75* 3.65*  CALCIUM 7.9* 7.8* 7.8* 8.0*  8.0* 7.6*  MG  --   --   --   --  1.6*  PHOS 3.9 4.2 4.0 4.2  --    Liver Function Tests: Recent Labs  Lab 02/16/21 1318 02/16/21 1719 02/16/21 2107 02/17/21 0114  AST  --   --   --  21  ALT  --   --   --  11  ALKPHOS  --   --   --  87  BILITOT  --   --   --  0.5  PROT  --   --   --  4.7*  ALBUMIN 2.4* 2.1* 1.9* 2.0*  1.9*    No results for input(s): LIPASE, AMYLASE in the last 168 hours. No results for input(s): AMMONIA in the last 168 hours. CBC: Recent Labs  Lab 02/16/21 0952 02/17/21 0114  WBC 4.0 4.3  HGB 9.3* 8.0*  HCT 28.5* 24.8*  MCV 96.9 98.0  PLT 186 181   Cardiac Enzymes: No results for input(s): CKTOTAL, CKMB, CKMBINDEX, TROPONINI in the last 168 hours. BNP: Invalid input(s): POCBNP CBG: Recent Labs  Lab 02/17/21 0802 02/17/21 1155 02/17/21 1721 02/17/21 2011 02/18/21 0744  GLUCAP 292* 90 95 109* 90   D-Dimer No results for input(s): DDIMER in the last 72 hours. Hgb A1c No results for input(s): HGBA1C in the last 72 hours. Lipid Profile No results for input(s): CHOL, HDL, LDLCALC, TRIG, CHOLHDL, LDLDIRECT in the last 72 hours. Thyroid function studies No results for input(s): TSH, T4TOTAL, T3FREE, THYROIDAB in the last 72 hours.  Invalid input(s): FREET3 Anemia work up No results for input(s): VITAMINB12, FOLATE, FERRITIN, TIBC, IRON, RETICCTPCT in the last 72 hours. Urinalysis    Component Value Date/Time   COLORURINE YELLOW 02/16/2021 1630   APPEARANCEUR HAZY (A) 02/16/2021 1630   LABSPEC 1.017 02/16/2021 1630   PHURINE 6.0 02/16/2021 1630   GLUCOSEU >=500 (A) 02/16/2021 1630   HGBUR SMALL (A) 02/16/2021 1630   BILIRUBINUR NEGATIVE 02/16/2021 1630   KETONESUR NEGATIVE 02/16/2021 1630   PROTEINUR 100 (A) 02/16/2021 1630   UROBILINOGEN 0.2 12/15/2015 1736   NITRITE NEGATIVE 02/16/2021 1630   LEUKOCYTESUR NEGATIVE 02/16/2021 1630   Sepsis Labs Invalid input(s): PROCALCITONIN,  WBC,  LACTICIDVEN Microbiology Recent Results (from the past 240 hour(s))  Resp Panel by RT-PCR (Flu A&B, Covid) Nasopharyngeal Swab     Status: None   Collection Time: 02/16/21 10:17 AM  Specimen: Nasopharyngeal Swab; Nasopharyngeal(NP) swabs in vial transport medium  Result Value Ref Range Status   SARS Coronavirus 2 by RT PCR NEGATIVE NEGATIVE Final    Comment: (NOTE) SARS-CoV-2  target nucleic acids are NOT DETECTED.  The SARS-CoV-2 RNA is generally detectable in upper respiratory specimens during the acute phase of infection. The lowest concentration of SARS-CoV-2 viral copies this assay can detect is 138 copies/mL. A negative result does not preclude SARS-Cov-2 infection and should not be used as the sole basis for treatment or other patient management decisions. A negative result may occur with  improper specimen collection/handling, submission of specimen other than nasopharyngeal swab, presence of viral mutation(s) within the areas targeted by this assay, and inadequate number of viral copies(<138 copies/mL). A negative result must be combined with clinical observations, patient history, and epidemiological information. The expected result is Negative.  Fact Sheet for Patients:  EntrepreneurPulse.com.au  Fact Sheet for Healthcare Providers:  IncredibleEmployment.be  This test is no t yet approved or cleared by the [place] FDA and  has been authorized for detection and/or diagnosis of SARS-CoV-2 by FDA under an Emergency Use Authorization (EUA). This EUA will remain  in effect (meaning this test can be used) for the duration of the COVID-19 declaration under Section 564(b)(1) of the Act, 21 U.S.C.section 360bbb-3(b)(1), unless the authorization is terminated  or revoked sooner.       Influenza A by PCR NEGATIVE NEGATIVE Final   Influenza B by PCR NEGATIVE NEGATIVE Final    Comment: (NOTE) The Xpert Xpress SARS-CoV-2/FLU/RSV plus assay is intended as an aid in the diagnosis of influenza from Nasopharyngeal swab specimens and should not be used as a sole basis for treatment. Nasal washings and aspirates are unacceptable for Xpert Xpress SARS-CoV-2/FLU/RSV testing.  Fact Sheet for Patients: EntrepreneurPulse.com.au  Fact Sheet for Healthcare  Providers: IncredibleEmployment.be  This test is not yet approved or cleared by the [place] FDA and has been authorized for detection and/or diagnosis of SARS-CoV-2 by FDA under an Emergency Use Authorization (EUA). This EUA will remain in effect (meaning this test can be used) for the duration of the COVID-19 declaration under Section 564(b)(1) of the Act, 21 U.S.C. section 360bbb-3(b)(1), unless the authorization is terminated or revoked.  Performed at [facility], [place] [address], [place], [place] [address]   MRSA PCR Screening     Status: None   Collection Time: 02/16/21  1:30 PM  Result Value Ref Range Status   MRSA by PCR NEGATIVE NEGATIVE Final    Comment:        The GeneXpert MRSA Assay (FDA approved for NASAL specimens only), is one component of a comprehensive MRSA colonization surveillance program. It is not intended to diagnose MRSA infection nor to guide or monitor treatment for MRSA infections. Performed at [facility], [place] [address] [place], [place] [address]     Procedures/Studies: DG Chest 2 View  Result Date: 02/16/2021 CLINICAL DATA:  Shortness of breath. EXAM: CHEST - 2 VIEW COMPARISON:  Earlier same day.  02/16/2021 FINDINGS: Streaky opacity at the lung bases is similar to prior, likely chronic atelectasis or scarring. Cannot exclude pneumonia in the retrocardiac left base. No edema or focal airspace consolidation. No pleural effusion. The cardio pericardial silhouette is enlarged. The visualized bony structures of the thorax show no acute abnormality. Telemetry leads overlie the chest. IMPRESSION: Chronic bibasilar atelectasis or scarring. Superimposed pneumonia at the left base cannot be excluded. Electronically Signed   By:  Misty Stanley M.D.   On: 02/16/2021 16:11   US RENAL  Result Date: 02/16/2021 CLINICAL DATA:  Acute kidney injury EXAM: RENAL / URINARY TRACT ULTRASOUND COMPLETE  COMPARISON:  None. FINDINGS: Right Kidney: Renal measurements: 11.8 x 5.8 x 5.6 cm = volume: 202 mL. Echogenicity within normal limits. No mass or hydronephrosis visualized. Left Kidney: Renal measurements: 10.0 x 5.6 x 5.5 cm = volume: 159 mL. Echogenicity within normal limits. No mass or hydronephrosis visualized. Bladder: Appears normal for degree of bladder distention. Other: Trace ascites. IMPRESSION: 1. Normal ultrasound examination of the kidneys.  No hydronephrosis. 2. Trace ascites. Electronically Signed   By: Eddie Candle M.D.   On: 02/16/2021 14:30   DG Chest Port 1 View  Result Date: 02/16/2021 CLINICAL DATA:  Dyspnea.  Shortness of breath and emesis. EXAM: PORTABLE CHEST 1 VIEW COMPARISON:  July 10, 2020. FINDINGS: Enlarged cardiac silhouette, similar to prior. Left basilar retrocardiac opacities with suggestion of air bronchograms. No definite pleural effusions or pneumothorax on this single semi erect radiograph. No acute osseous abnormality. IMPRESSION: 1. Left basilar retrocardiac opacities, suspicious for pneumonia and/or aspiration. Dedicated PA and lateral radiographs could better characterize if clinically indicated. 2. Cardiomegaly. Electronically Signed   By: Margaretha Sheffield MD   On: 02/16/2021 11:52     Time coordinating discharge: Over 30 minutes    Dwyane Dee, MD  [facility] 02/19/2021, 3:14 PM

## 2021-02-21 ENCOUNTER — Telehealth: Payer: Self-pay

## 2021-02-21 NOTE — Telephone Encounter (Signed)
Transition Care Management Unsuccessful Follow-up Telephone Call  Date of discharge and from where:  02/18/2021 from [facility]  Attempts:  1st Attempt  Reason for unsuccessful TCM follow-up call:  Unable to leave message

## 2021-02-22 NOTE — Telephone Encounter (Signed)
Transition Care Management Follow-up Telephone Call Date of discharge and from where: 02/18/2021 from [facility] How have you been since you were released from the hospital? Pt stated that she is feeling okay and does not have any questions or concerns at this time.  Any questions or concerns? No  Items Reviewed: Did the pt receive and understand the discharge instructions provided? Yes  Medications obtained and verified? Yes  Other? No  Any new allergies since your discharge? No  Dietary orders reviewed? No Do you have support at home? Yes   Functional Questionnaire: (I = Independent and D = Dependent) ADLs: I  Bathing/Dressing- I  Meal Prep- I  Eating- I  Maintaining continence- I  Transferring/Ambulation- I  Managing Meds- I   Follow up appointments reviewed:  PCP Hospital f/u appt confirmed? No   Specialist Hospital f/u appt confirmed? Yes  Scheduled to see [facility] on 04/05/2021 @ 2:40pm. Are transportation arrangements needed? No  If their condition worsens, is the pt aware to call PCP or go to the Emergency Dept.? Yes Was the patient provided with contact information for the PCP's office or ED? Yes Was to pt encouraged to call back with questions or concerns? Yes.

## 2021-02-27 IMAGING — US US MFM OB COMPLETE +14 WKS
1 series · 12 of 25 positions shown · non-contrast
Comparison: none

[Series 1: us mfm ob complete +14 wks · 12 of 25 slices shown]
[im 2/25]
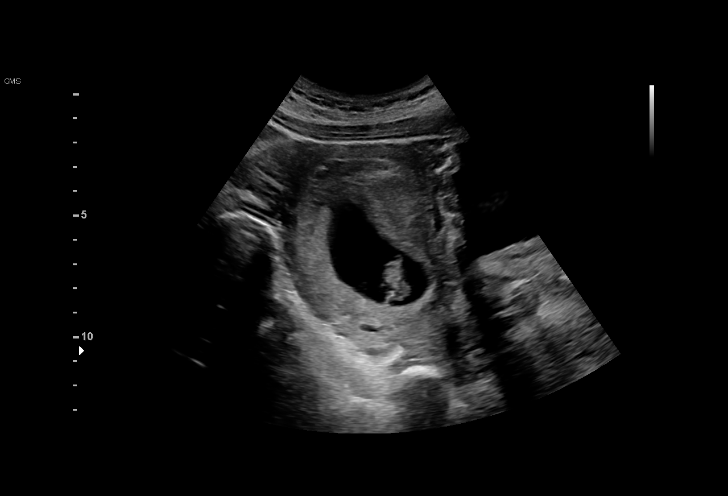
[im 4/25]
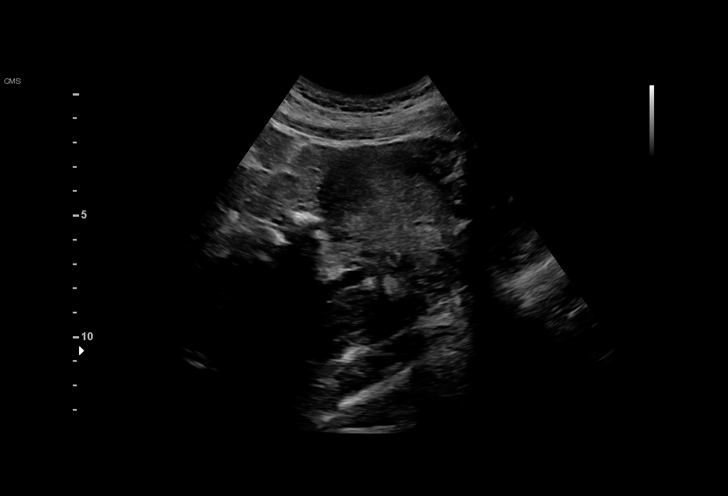
[im 6/25]
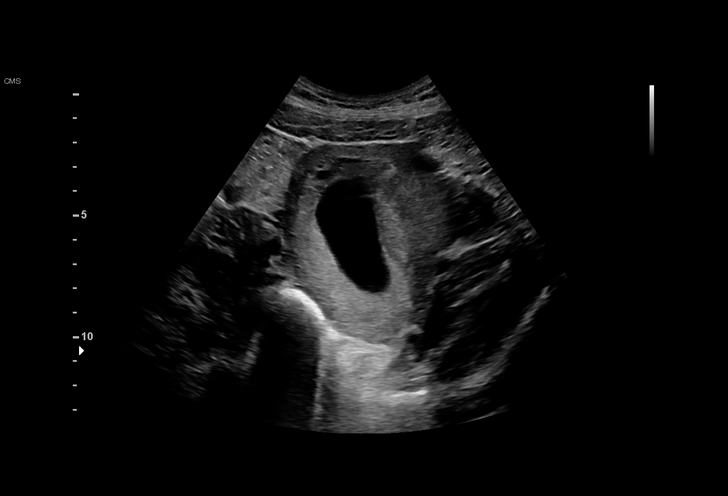
[im 8/25]
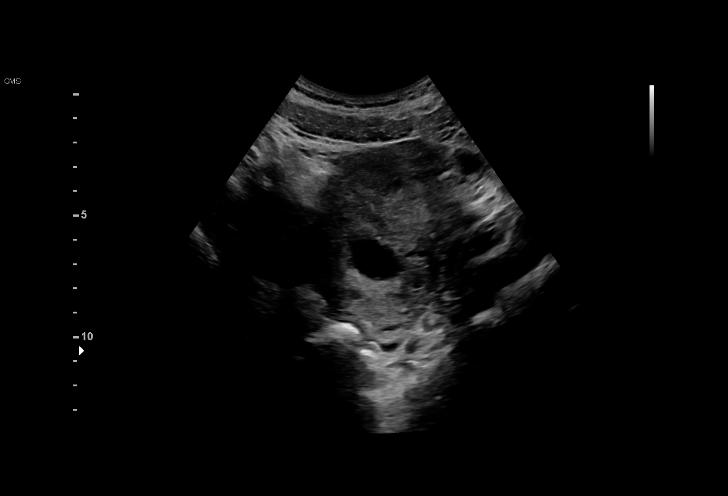
[im 10/25]
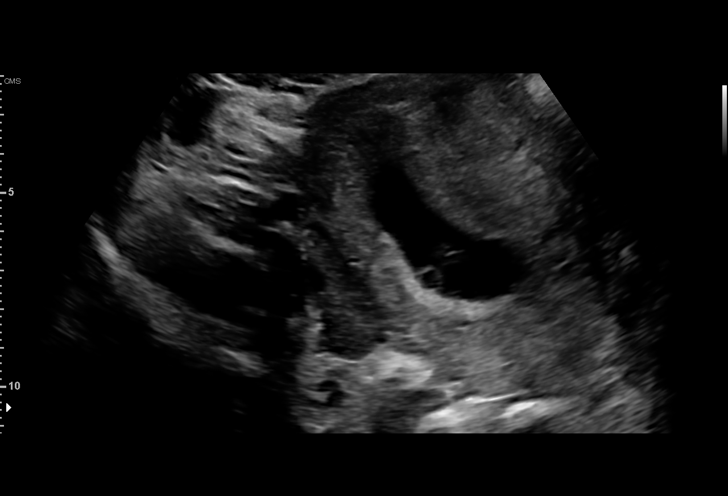
[im 12/25]
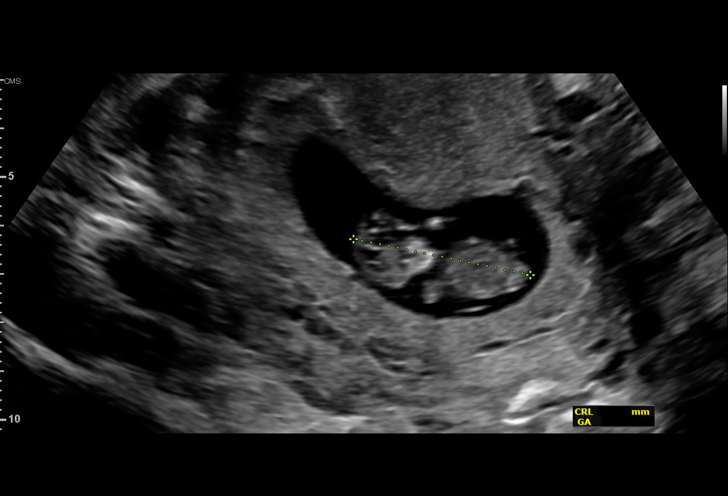
[im 14/25]
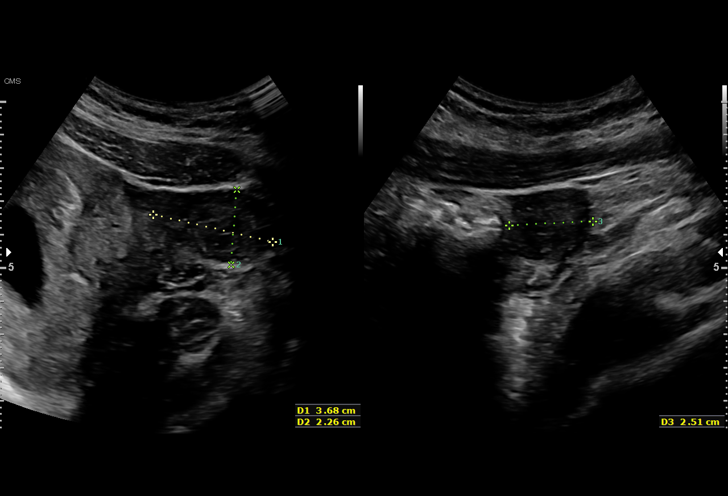
[im 16/25]
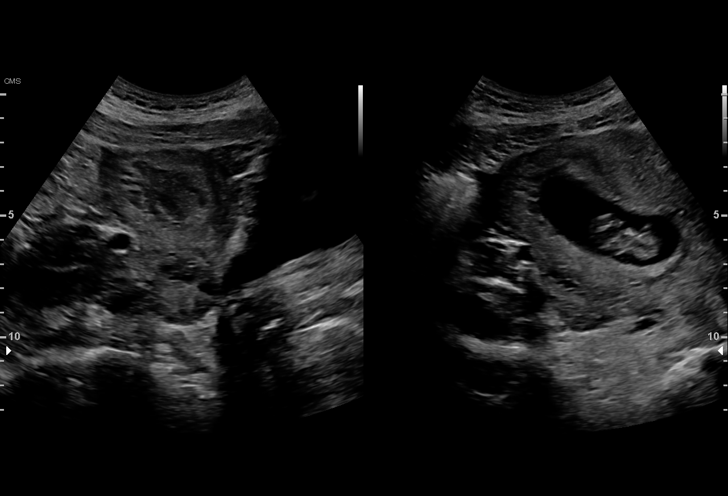
[im 18/25]
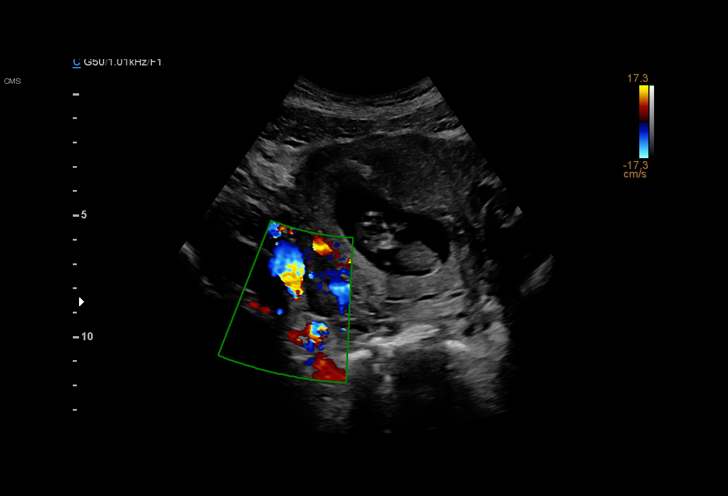
[im 20/25]
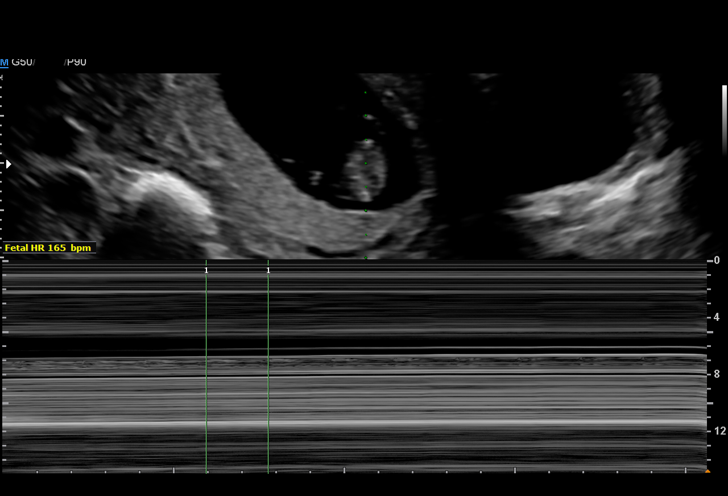
[im 22/25]
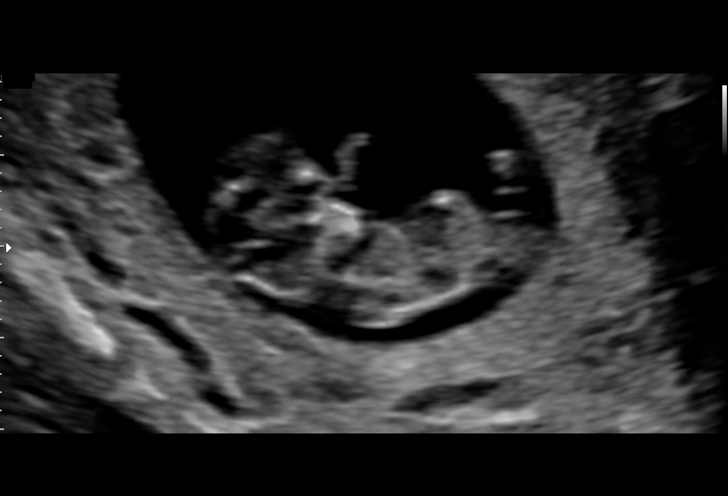
[im 24/25]
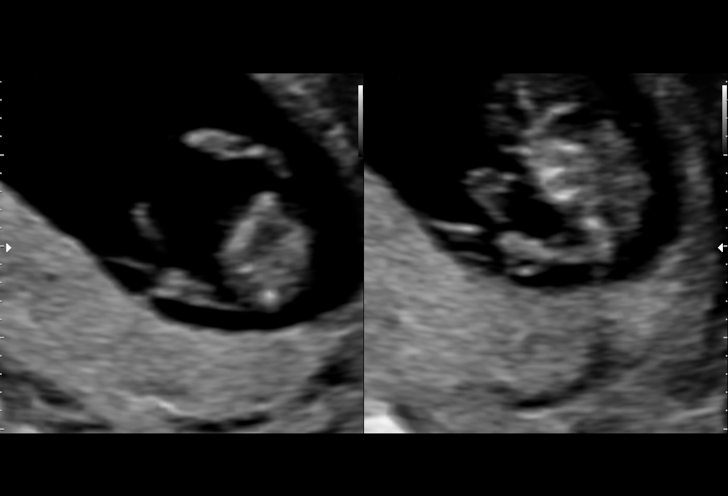

[12 of 25 positions shown; findings below may reference images not displayed]

Ave.,[HOSPITAL]

     14 WEEKS
 ----------------------------------------------------------------------

 ----------------------------------------------------------------------
Indications

  Pre-existing diabetes, type 1, in pregnancy,
  first trimester
  Antenatal screening for malformations
  10 weeks gestation of pregnancy
 ----------------------------------------------------------------------
Fetal Evaluation

 Num Of Fetuses:         1
 Preg. Location:         Intrauterine
 Gest. Sac:              Intrauterine
 Yolk Sac:               Visualized
 Fetal Pole:             Visualized
 Fetal Heart Rate(bpm):  165
 Cardiac Activity:       Observed

 Amniotic Fluid
 AFI FV:      Within normal limits
Biometry

 CRL:     37.84  mm     G. Age:  10w 4d                  EDD:   12/26/19
OB History

 Gravidity:    2         Term:   0        Prem:   0        SAB:   1
 TOP:          0       Ectopic:  0        Living: 0
Gestational Age
 LMP:           11w 4d        Date:  03/14/19                 EDD:   12/19/19
 Best:          10w 4d     Det. By:  Early Ultrasound         EDD:   12/26/19
                                     (04/30/19)
Cervix Uterus Adnexa

 Cervix
 Closed.

 Uterus
 No abnormality visualized.

 Left Ovary
 Within normal limits.

 Right Ovary
 Within normal limits.

 Cul De Sac
 No free fluid seen.

 Adnexa
 No abnormality visualized.
Comments

 This patient was seen in consultation at the request of Dr.
 Diane due to uncontrolled type 1 diabetes.  The patient
 reports that she was diagnosed with type 1 diabetes about 15
 years ago.  The patient was recently hospitalized as her
 blood sugars were as high as the 400s range.  While in the
 hospital, she was started on NPH and NovoLog insulin.  The
 patient reports that her fingerstick values are now under
 better control since being put on her current insulin regimen.
 Her hemoglobin A1c at the time of conception was 11.6%.

 A 24-hour urine collected during her hospitalization showed
 8718 mg of protein.  There was also a creatinine clearance
 that was lower than expected.  Her serum creatinine level
 was 1.27 one month ago.  A renal consult performed at the
 time of her admission stated that the patient has presumed
 diabetic nephropathy, as lupus was essentially ruled out as a
 cause of the nephropathy based on negative C3, C4, and
 negative OFOTSU tests.  The patient reports that she is
 scheduled to see an endocrinologist to help with the
 management of her diabetes later this week.  She is
 scheduled to have an eye exam to screen for diabetic
 retinopathy early next week.  She reports that her blood
 pressures are within normal limits.

 The crown-rump length measured today is consistent with her
 gestational age, giving her an EDC December 26, 2019.

 The implications and management of diabetes in pregnancy
 was discussed in detail with the patient.  The patient was
 advised that due to her significantly elevated blood glucose
 levels early in her current pregnancy, that her fetus is at
 increased risk for birth defects including cardiac defects.  Due
 to diabetes and diabetic nephropathy, she will also be at
 increased risk for miscarriage, fetal growth issues, early
 onset severe preeclampsia, an indicated preterm birth, and
 even an IUFD.
 She was advised that our goals for her fingerstick values are
 fasting values of 90-95 or less and two-hour postprandials of
 120 or less.  Should the majority of her fingerstick values be
 above these values, her insulin regimen may need to be
 adjusted to help her achieve better glycemic control. The
 patient was advised that getting her fingerstick values as
 close to these goals as possible would provide her with the
 most optimal obstetrical outcome.
 The patient was advised that diabetic nephropathy probably
 will increase her risk of IUGR and the development of
 preeclampsia.  To decrease her risk of preeclampsia in her
 current pregnancy, the patient was advised to start taking a
 daily baby aspirin (81 mg daily) starting next week.  She was
 advised to continue taking a daily baby aspirin for
 preeclampsia prophylaxis for the duration of her pregnancy.
 The patient was advised that due to diabetes in pregnancy,
 we will continue to follow her with serial ultrasounds.  A fetal
 anatomy scan has been scheduled at around 19 weeks.  A
 fetal echocardiogram will also be scheduled 3-4 weeks after
 her fetal anatomy scan.  The patient should then continue to
 be followed with serial growth ultrasounds.  Twice-weekly
 fetal testing should be started at around 32 weeks.  Due to
 pre-gestational diabetes with diabetic nephropathy, I would
 recommend that she be delivered at around 37 weeks.  An
 earlier delivery would be indicated should she develop
 preeclampsia or any other pregnancy complications.

 The patient should also have a baseline EKG performed to
 rule out any cardiac sequelae due to her longstanding
 diabetes.

 The patient met with our genetic counselor following today's
 consultation to have a cell free DNA test drawn for screening
 of fetal aneuploidy.  Our genetic counselor will notify the
 patient regarding the cell free DNA test results.

 At the end of the consultation, the patient stated that all her
 questions had been answered to her complete satisfaction.

 A total of 60 minutes was spent counseling and coordinating
 the care for this patient.  Greater than 50% of the time was
 spent in direct face-to-face contact.
Recommendations

 Baseline EKG
 Continue the patient's insulin regimen to help her achieve
 optimal glycemic control.  Her insulin regimen should be
 adjusted as needed.
 Endocrine consult as planned.
 Continued follow-up with nephrology.
 Start daily baby aspirin next week for preeclampsia
 prophylaxis.
 Fetal anatomy scan at around 19 weeks.
 Fetal echocardiogram at around 22 weeks.
 Serial growth ultrasounds.
 Twice weekly fetal testing to start at around 32 weeks.
 Delivery at around 37 weeks or earlier based on clinical
 status.

## 2021-03-29 ENCOUNTER — Other Ambulatory Visit (HOSPITAL_COMMUNITY)
Admission: RE | Admit: 2021-03-29 | Discharge: 2021-03-29 | Disposition: A | Discharge status: Home / Self Care | Payer: Medicaid Other | Source: Ambulatory Visit

## 2021-03-29 ENCOUNTER — Encounter: Payer: Self-pay | Admitting: General Practice

## 2021-03-29 ENCOUNTER — Other Ambulatory Visit: Payer: Self-pay

## 2021-03-29 ENCOUNTER — Ambulatory Visit (INDEPENDENT_AMBULATORY_CARE_PROVIDER_SITE_OTHER): Payer: Medicaid Other

## 2021-03-29 VITALS — BP 150/100 | Ht 60.0 in | Wt 132.0 lb

## 2021-03-29 DIAGNOSIS — Z30013 Encounter for initial prescription of injectable contraceptive: Secondary | ICD-10-CM

## 2021-03-29 DIAGNOSIS — Z113 Encounter for screening for infections with a predominantly sexual mode of transmission: Secondary | ICD-10-CM | POA: Insufficient documentation

## 2021-03-29 DIAGNOSIS — Z30432 Encounter for removal of intrauterine contraceptive device: Secondary | ICD-10-CM | POA: Diagnosis not present

## 2021-03-29 MED ORDER — MEDROXYPROGESTERONE ACETATE 150 MG/ML IM SUSP
150.0000 mg | Freq: Once | INTRAMUSCULAR | Status: AC
  Administered 2021-03-29: 150 mg via INTRAMUSCULAR

## 2021-03-29 MED ORDER — MEDROXYPROGESTERONE ACETATE 150 MG/ML IM SUSP: 150.0000 mg | INTRAMUSCULAR | 3 refills | Status: DC

## 2021-03-29 NOTE — Progress Notes (Signed)
    GYNECOLOGY OFFICE PROCEDURE NOTE  Kathy Lewis is a 25 y.o. [phone] here for Mirena IUD removal. She reports that she has pain with intercourse and SO complaint of perception of IUD.  She states after sex she has a "lingering pain" that varies from minutes to up to a day.  She denies abnormal vaginal bleeding or discharge. She was offered STD testing and accepts. No GYN concerns.  Last pap smear in Sept 2020 and was normal. Patient endorses history of HTN and takes Hydralazine '25mg'$ , but reports it was not taken this am.  IUD Removal  Patient identified, informed consent performed, consent signed.  Patient was in the dorsal lithotomy position, normal external genitalia was noted.  A speculum was placed in the patient's vagina, normal discharge was noted, no lesions. CV collected. The cervix was visualized, no lesions, no abnormal discharge.  The strings of the IUD were grasped and pulled using ring forceps. The IUD was removed in its entirety.  Patient tolerated the procedure well.    Patient will use Depo Provera for contraception.  Reviewed potential side effects with depo usage including change in menstrual cycle and weight gain.  Routine preventative health maintenance measures emphasized.   Maryann Conners MSN, CNM Advanced Practice Provider, [facility]

## 2021-03-29 NOTE — Progress Notes (Signed)
Patient states she has had her IUD for approx one year and states she is experiencing pain with it. Patient would like to try Depo Provera (has used it in the past). Kathrene Alu RN  Used stock Depo Provera for patients first dose. Sent script to pharmacy for subsequent doses. Kathrene Alu RN

## 2021-03-30 LAB — GC/CHLAMYDIA PROBE AMP (~~LOC~~) NOT AT ARMC
Chlamydia: NEGATIVE
Comment: NEGATIVE
Comment: NORMAL
Neisseria Gonorrhea: NEGATIVE

## 2021-05-07 ENCOUNTER — Emergency Department (HOSPITAL_BASED_OUTPATIENT_CLINIC_OR_DEPARTMENT_OTHER)
Admission: EM | Admit: 2021-05-07 | Discharge: 2021-05-07 | Disposition: A | Discharge status: Home / Self Care | Payer: Medicaid Other | Attending: Emergency Medicine | Admitting: Emergency Medicine

## 2021-05-07 ENCOUNTER — Encounter (HOSPITAL_BASED_OUTPATIENT_CLINIC_OR_DEPARTMENT_OTHER): Payer: Self-pay | Admitting: Emergency Medicine

## 2021-05-07 DIAGNOSIS — Z794 Long term (current) use of insulin: Secondary | ICD-10-CM | POA: Insufficient documentation

## 2021-05-07 DIAGNOSIS — E1010 Type 1 diabetes mellitus with ketoacidosis without coma: Secondary | ICD-10-CM | POA: Diagnosis not present

## 2021-05-07 DIAGNOSIS — E1022 Type 1 diabetes mellitus with diabetic chronic kidney disease: Secondary | ICD-10-CM | POA: Diagnosis not present

## 2021-05-07 DIAGNOSIS — R109 Unspecified abdominal pain: Secondary | ICD-10-CM | POA: Insufficient documentation

## 2021-05-07 DIAGNOSIS — I130 Hypertensive heart and chronic kidney disease with heart failure and stage 1 through stage 4 chronic kidney disease, or unspecified chronic kidney disease: Secondary | ICD-10-CM | POA: Insufficient documentation

## 2021-05-07 DIAGNOSIS — G8918 Other acute postprocedural pain: Secondary | ICD-10-CM | POA: Insufficient documentation

## 2021-05-07 DIAGNOSIS — Z87891 Personal history of nicotine dependence: Secondary | ICD-10-CM | POA: Insufficient documentation

## 2021-05-07 DIAGNOSIS — I5022 Chronic systolic (congestive) heart failure: Secondary | ICD-10-CM | POA: Diagnosis not present

## 2021-05-07 DIAGNOSIS — N184 Chronic kidney disease, stage 4 (severe): Secondary | ICD-10-CM | POA: Diagnosis not present

## 2021-05-07 DIAGNOSIS — Z79899 Other long term (current) drug therapy: Secondary | ICD-10-CM | POA: Diagnosis not present

## 2021-05-07 LAB — CBG MONITORING, ED: Glucose-Capillary: 154 mg/dL — ABNORMAL HIGH (ref 70–99)

## 2021-05-07 MED ORDER — OXYCODONE HCL 5 MG PO TABS
5.0000 mg | ORAL_TABLET | Freq: Once | ORAL | Status: AC
  Administered 2021-05-07: 5 mg via ORAL
  Filled 2021-05-07: qty 1

## 2021-05-07 MED ORDER — OXYCODONE HCL 5 MG PO TABS: 5.0000 mg | ORAL_TABLET | Freq: Four times a day (QID) | ORAL | 0 refills | Status: DC | PRN

## 2021-05-07 NOTE — ED Triage Notes (Signed)
Pt states she had a peritoneal dialysis catheter placed on Wednesday at [facility] states she ran out of her pain medication last night and is having pain at the insertion site  Pt states they flushed the catheter yesterday and is going to the dr on Monday for dressing changes

## 2021-05-07 NOTE — Discharge Instructions (Addendum)
Recommend taking 1000 mg of Tylenol every 6 hours as needed for pain.  Recommend taking Roxicodone as needed for pain.  Follow-up at dialysis as scheduled.

## 2021-05-07 NOTE — ED Provider Notes (Signed)
[facility] Provider Note   CSN: [id number] Arrival date & time: 05/07/21  [id number]     History Chief Complaint  Patient presents with   Post-op Problem    Kathy Lewis is a 25 y.o. female.  Here with some ongoing postoperative pain at her dialysis catheter site.  She had peritoneal dialysis catheter placed several days ago.  Had her catheter flushed yesterday without any issues.  She has not had any fevers or chills.  Having this intermittent pain but mostly at the catheter site.  She denies any drainage from the catheter site or redness or swelling.  Patient was on narcotic pain medicine but no longer has any pills for that.  The history is provided by the patient.  Illness Severity:  Mild Onset quality:  Gradual Timing:  Intermittent Progression:  Waxing and waning Chronicity:  New Associated symptoms: abdominal pain   Associated symptoms: no chest pain, no congestion, no cough, no diarrhea, no ear pain, no fatigue, no fever, no headaches, no loss of consciousness, no myalgias, no nausea, no rash, no rhinorrhea, no shortness of breath, no sore throat, no vomiting and no wheezing       Past Medical History:  Diagnosis Date   Anxiety    Bipolar 2 disorder (HCC)    CHF (congestive heart failure) (HCC)    CKD (chronic kidney disease), stage III (HCC)    Depression    DKA (diabetic ketoacidoses)    HSV infection    on valtrex   Hypokalemia    Leukocytosis    Migraine    Noncompliance with medication regimen    Preeclampsia    Severe anemia    Type 1 diabetes mellitus ([place])     Patient Active Problem List   Diagnosis Date Noted   Bipolar 2 disorder ([place])    HTN (hypertension)    Acute renal failure superimposed on stage 4 chronic kidney disease ([place]) [id number]   Chronic systolic CHF (congestive heart failure) ([place]) 07/11/2020   CKD (chronic kidney disease), stage III (HCC)    Hypoalbuminemia    Anxiety    Normocytic anemia     Borderline prolonged QT interval    CHF exacerbation ([place]) 07/10/2020   Fever 10/28/2019   Preeclampsia 10/15/2019   Severe anemia 10/13/2019   Pre-existing type 1 diabetes mellitus with hyperglycemia during pregnancy in first trimester ([place]) 04/30/2019   Hypokalemia    DKA (diabetic ketoacidoses) 12/18/2014   Leukocytosis 12/18/2014   Noncompliance with medications 12/18/2014   Type 1 diabetes mellitus with ketoacidosis, uncontrolled ([place]) 12/18/2014   Nausea and vomiting 12/18/2014    Past Surgical History:  Procedure Laterality Date   CARDIAC CATHETERIZATION     DILATION AND EVACUATION N/A 10/22/2019   Procedure: ULTRASOUND GUIDED DILATATION AND EVACUATION;  Surgeon: [name], MD;  Location: [facility] LD ORS;  Service: Gynecology;  Laterality: N/A;   NO PAST SURGERIES     peritoneal dialysis catheter insertion     RENAL BIOPSY       OB History     Gravida  2   Para  1   Term      Preterm  1   AB  1   Living  1      SAB  1   IAB      Ectopic      Multiple  0   Live Births  1           Family History  Adopted: Yes  Family history unknown: Yes    Social History   Tobacco Use   Smoking status: Former    Packs/day: 1.00    Types: Cigars, Cigarettes   Smokeless tobacco: Never  Vaping Use   Vaping Use: Never used  Substance Use Topics   Alcohol use: No   Drug use: No    Home Medications Prior to Admission medications   Medication Sig Start Date End Date Taking? Authorizing Provider  oxyCODONE (ROXICODONE) 5 MG immediate release tablet Take 1 tablet (5 mg total) by mouth every 6 (six) hours as needed for up to 15 doses for severe pain. 05/07/21  Yes Elanie Hammitt, DO  acetaminophen (TYLENOL) 500 MG tablet Take 1,000 mg by mouth every 6 (six) hours as needed for moderate pain.    [provider]  albuterol (VENTOLIN HFA) 108 (90 Base) MCG/ACT inhaler Inhale 2 puffs into the lungs every 6 (six) hours as needed for wheezing or shortness  of breath.  01/15/20 02/16/21  [provider]  benzonatate (TESSALON) 100 MG capsule Take 100 mg by mouth 3 (three) times daily. Patient not taking: Reported on 03/29/2021    [provider]  butalbital-acetaminophen-caffeine [facility]) [phone] MG tablet Take 1 tablet by mouth daily as needed for headache. 02/18/21 02/18/22  Dwyane Dee, MD  carvedilol (COREG) 12.5 MG tablet Take 12.5 mg by mouth 2 (two) times daily with a meal.    [provider]  hydrALAZINE (APRESOLINE) 25 MG tablet Take 75 mg by mouth 3 (three) times daily.    [provider]  insulin aspart (NOVOLOG) 100 UNIT/ML injection Inject 2 Units into the skin 3 (three) times daily with meals. Patient taking differently: Inject 2-8 Units into the skin 3 (three) times daily with meals. Per sliding 10/30/19   Christophe Louis, MD  insulin detemir (LEVEMIR) 100 UNIT/ML injection Inject 0.08 mLs (8 Units total) into the skin daily. Patient taking differently: Inject 10 Units into the skin 2 (two) times daily. 10/31/19   Christophe Louis, MD  Insulin Syringe-Needle U-100 (INSULIN SYRINGE .3CC/31GX5/16") 31G X 5/16" 0.3 ML MISC Use as directed 10/27/19   [name], MD  isosorbide mononitrate (IMDUR) 30 MG 24 hr tablet Take 30 mg by mouth daily.    [provider]  losartan (COZAAR) 25 MG tablet Take 25 mg by mouth daily.    [provider]  medroxyPROGESTERone (DEPO-PROVERA) 150 MG/ML injection Inject 1 mL (150 mg total) into the muscle every 3 (three) months. 03/29/21   Gavin Pound, CNM  potassium chloride (KLOR-CON) 10 MEQ tablet Take 1 tablet (10 mEq total) by mouth daily. 10/27/19   [name], MD  torsemide (DEMADEX) 20 MG tablet Take 20-40 mg by mouth See admin instructions. Takes 2 tablets in the morning and 1 tablet at lunch    [provider]  Vitamin D, Ergocalciferol, (DRISDOL) 1.25 MG (50000 UNIT) CAPS capsule Take 50,000 Units by mouth every 7 (seven) days.    [provider]  escitalopram (LEXAPRO) 10 MG tablet Take 20 mg by mouth daily.  05/10/20 10/06/20  [provider]  furosemide (LASIX) 40 MG tablet Take 1 tablet (40 mg total) by mouth daily. Patient not taking: No sig reported 10/28/19 02/17/21  [name], MD  lisinopril (ZESTRIL) 10 MG tablet Take 10 mg by mouth daily.  02/23/20 10/06/20  [provider]  spironolactone (ALDACTONE) 25 MG tablet Take 25 mg by mouth daily.  04/20/20 10/06/20  [provider]    Allergies  Cantaloupe extract allergy skin test, Strawberry extract, Citrullus vulgaris, and Nsaids  Review of Systems   Review of Systems  Constitutional:  Negative for chills, fatigue and fever.  HENT:  Negative for congestion, ear pain, rhinorrhea and sore throat.   Eyes:  Negative for pain and visual disturbance.  Respiratory:  Negative for cough, shortness of breath and wheezing.   Cardiovascular:  Negative for chest pain and palpitations.  Gastrointestinal:  Positive for abdominal pain. Negative for abdominal distention, diarrhea, nausea and vomiting.  Genitourinary:  Negative for dysuria and hematuria.  Musculoskeletal:  Negative for arthralgias, back pain and myalgias.  Skin:  Negative for color change and rash.  Neurological:  Negative for seizures, loss of consciousness, syncope and headaches.  All other systems reviewed and are negative.  Physical Exam Updated Vital Signs  ED Triage Vitals  Enc Vitals Group     BP 05/07/21 0648 (!) 172/111     Pulse Rate 05/07/21 0648 98     Resp 05/07/21 0648 14     Temp 05/07/21 0648 99 F (37.2 C)     Temp Source 05/07/21 0648 Oral     SpO2 05/07/21 0648 100 %     Weight 05/07/21 0644 135 lb (61.2 kg)     Height 05/07/21 0644 5' (1.524 m)     Head Circumference --      Peak Flow --      Pain Score 05/07/21 0644 10     Pain Loc --      Pain Edu? --      Excl. in [place]? --      Physical Exam Vitals and nursing note reviewed.  Constitutional:       General: She is not in acute distress.    Appearance: She is well-developed.  HENT:     Head: Normocephalic and atraumatic.  Eyes:     Conjunctiva/sclera: Conjunctivae normal.  Cardiovascular:     Rate and Rhythm: Normal rate and regular rhythm.     Pulses: Normal pulses.     Heart sounds: Normal heart sounds. No murmur heard. Pulmonary:     Effort: Pulmonary effort is normal. No respiratory distress.     Breath sounds: Normal breath sounds.  Abdominal:     General: There is no distension.     Palpations: Abdomen is soft.     Tenderness: There is abdominal tenderness. There is no guarding or rebound.     Hernia: No hernia is present.     Comments: Some tenderness around peritoneal dialysis catheter site but abdomen is soft, nondistended, there is no drainage or redness around peritoneal catheter site  Musculoskeletal:     Cervical back: Normal range of motion and neck supple.  Skin:    General: Skin is warm and dry.     Capillary Refill: Capillary refill takes less than 2 seconds.     Findings: No erythema.     Comments: Peritoneal dialysis catheter site is clean, dry, intact  Neurological:     Mental Status: She is alert.    ED Results / Procedures / Treatments   Labs (all labs ordered are listed, but only abnormal results are displayed) Labs Reviewed  CBG MONITORING, ED - Abnormal; Notable for the following components:      Result Value   Glucose-Capillary 154 (*)    All other components within normal limits    EKG None  Radiology No results found.  Procedures Procedures   Medications Ordered in ED Medications  oxyCODONE (  Oxy IR/ROXICODONE) immediate release tablet 5 mg (5 mg Oral Given 05/07/21 0731)    ED Course  I have reviewed the triage vital signs and the nursing notes.  Pertinent labs & imaging results that were available during my care of the patient were reviewed by me and considered in my medical decision making (see chart for details).    MDM  Rules/Calculators/A&P                           Kathy Lewis is here with pain at her recent dialysis catheter site.  Unremarkable vitals.  No fever.  Had dialysis catheter placed several days ago.  Still having some discomfort around her catheter site.  Still having to wear abdominal binder which appears to be making her feel uncomfortable at times.  Catheter was flushed yesterday without any issues.  She has not used this site yet and is supposed to follow-up on Monday.  She has no fever.  No chills.  Dialysis catheter site appears clean, dry, intact.  No signs of infection there.  Abdominal exam is overall benign.  Narcotic prescription ran out yesterday.  Overall do not think that there is any complication or infectious process going on.  Catheter looks very clean, abdominal exam is unremarkable.  We will extend her narcotic prescription out several more days and encourage Tylenol use prior to oxycodone use.  Recommend follow-up with any type of infectious changes occur at surgical site or if she develops any fever or severe/worsening abdominal pain.  Discharged in good condition.  This chart was dictated using voice recognition software.  Despite best efforts to proofread,  errors can occur which can change the documentation meaning.   Final Clinical Impression(s) / ED Diagnoses Final diagnoses:  Post-operative pain    Rx / DC Orders ED Discharge Orders          Ordered    oxyCODONE (ROXICODONE) 5 MG immediate release tablet  Every 6 hours PRN        05/07/21 [place], [place], DO 05/07/21 [phone]

## 2021-05-09 ENCOUNTER — Encounter (HOSPITAL_COMMUNITY): Payer: Self-pay | Admitting: Emergency Medicine

## 2021-05-09 ENCOUNTER — Emergency Department (HOSPITAL_COMMUNITY)
Admission: EM | Admit: 2021-05-09 | Discharge: 2021-05-09 | Disposition: A | Discharge status: Home / Self Care | Payer: Medicaid Other | Attending: Emergency Medicine | Admitting: Emergency Medicine

## 2021-05-09 ENCOUNTER — Emergency Department (HOSPITAL_COMMUNITY): Payer: Medicaid Other

## 2021-05-09 ENCOUNTER — Other Ambulatory Visit: Payer: Self-pay

## 2021-05-09 DIAGNOSIS — R52 Pain, unspecified: Secondary | ICD-10-CM | POA: Insufficient documentation

## 2021-05-09 DIAGNOSIS — Z5321 Procedure and treatment not carried out due to patient leaving prior to being seen by health care provider: Secondary | ICD-10-CM | POA: Insufficient documentation

## 2021-05-09 LAB — I-STAT BETA HCG BLOOD, ED (MC, WL, AP ONLY): I-stat hCG, quantitative: 15.2 m[IU]/mL — ABNORMAL HIGH (ref <5)

## 2021-05-09 LAB — COMPREHENSIVE METABOLIC PANEL
ALT: 6 U/L (ref 0–44)
AST: 15 U/L (ref 15–41)
Albumin: 1.8 g/dL — ABNORMAL LOW (ref 3.5–5.0)
Alkaline Phosphatase: 67 U/L (ref 38–126)
Anion gap: 7 (ref 5–15)
BUN: 47 mg/dL — ABNORMAL HIGH (ref 6–20)
CO2: 21 mmol/L — ABNORMAL LOW (ref 22–32)
Calcium: 8.2 mg/dL — ABNORMAL LOW (ref 8.9–10.3)
Chloride: 110 mmol/L (ref 98–111)
Creatinine, Ser: 5.21 mg/dL — ABNORMAL HIGH (ref 0.44–1.00)
GFR, Estimated: 11 mL/min — ABNORMAL LOW (ref >60)
Glucose, Bld: 199 mg/dL — ABNORMAL HIGH (ref 70–99)
Potassium: 5 mmol/L (ref 3.5–5.1)
Sodium: 138 mmol/L (ref 135–145)
Total Bilirubin: 0.6 mg/dL (ref 0.3–1.2)
Total Protein: 4.9 g/dL — ABNORMAL LOW (ref 6.5–8.1)

## 2021-05-09 LAB — CBC WITH DIFFERENTIAL/PLATELET
Abs Immature Granulocytes: 0.02 10*3/uL (ref 0.00–0.07)
Basophils Absolute: 0 10*3/uL (ref 0.0–0.1)
Basophils Relative: 1 %
Eosinophils Absolute: 0.1 10*3/uL (ref 0.0–0.5)
Eosinophils Relative: 2 %
HCT: 24.8 % — ABNORMAL LOW (ref 36.0–46.0)
Hemoglobin: 7.8 g/dL — ABNORMAL LOW (ref 12.0–15.0)
Immature Granulocytes: 0 %
Lymphocytes Relative: 25 %
Lymphs Abs: 1.5 10*3/uL (ref 0.7–4.0)
MCH: 31.7 pg (ref 26.0–34.0)
MCHC: 31.5 g/dL (ref 30.0–36.0)
MCV: 100.8 fL — ABNORMAL HIGH (ref 80.0–100.0)
Monocytes Absolute: 0.3 10*3/uL (ref 0.1–1.0)
Monocytes Relative: 5 %
Neutro Abs: 4.2 10*3/uL (ref 1.7–7.7)
Neutrophils Relative %: 67 %
Platelets: 221 10*3/uL (ref 150–400)
RBC: 2.46 MIL/uL — ABNORMAL LOW (ref 3.87–5.11)
RDW: 11.9 % (ref 11.5–15.5)
WBC: 6.2 10*3/uL (ref 4.0–10.5)
nRBC: 0 % (ref 0.0–0.2)

## 2021-05-09 LAB — LIPASE, BLOOD: Lipase: 18 U/L (ref 11–51)

## 2021-05-09 NOTE — ED Triage Notes (Signed)
Pt BIB [facility], had a peritoneal dialysis catheter placed last week. C/o increasing pain to site. Seen at [facility] for same. States catheter has been flushed successfully.

## 2021-05-09 NOTE — ED Notes (Signed)
Pt left AMA °

## 2021-05-09 NOTE — ED Provider Notes (Signed)
MSE was initiated and I personally evaluated the patient and placed orders (if any) at  2:02 AM on May 09, 2021.  Patient with new peritoneal dialysis catheter placed 9/7. Here by EMS for c/o pain at the catheter site. No redness, fever, vomiting. It is flushing without difficulty. Dialysis with the catheter due to start later this month. Seen yesterday at [facility]. She has not been in contact with her surgeon. Here today because pain is worse.   Today's Vitals   05/09/21 0157  BP: (!) 166/111  Pulse: 100  Resp: 18  Temp: 99.6 F (37.6 C)  TempSrc: Oral  SpO2: 100%   There is no height or weight on file to calculate BMI.  In NAD Catheter site around bandages unremarkable, no redness.  The patient appears stable so that the remainder of the MSE may be completed by another provider.   Charlann Lange, PA-C 05/09/21 0206    Palumbo, April, MD 05/09/21 [id number]

## 2021-05-12 ENCOUNTER — Emergency Department (HOSPITAL_COMMUNITY): Payer: Medicaid Other

## 2021-05-12 ENCOUNTER — Other Ambulatory Visit: Payer: Self-pay

## 2021-05-12 ENCOUNTER — Encounter (HOSPITAL_COMMUNITY): Payer: Self-pay | Admitting: Emergency Medicine

## 2021-05-12 ENCOUNTER — Emergency Department (HOSPITAL_COMMUNITY)
Admission: EM | Admit: 2021-05-12 | Discharge: 2021-05-13 | Disposition: A | Discharge status: Home / Self Care | Payer: Medicaid Other | Attending: Emergency Medicine | Admitting: Emergency Medicine

## 2021-05-12 DIAGNOSIS — Z794 Long term (current) use of insulin: Secondary | ICD-10-CM | POA: Insufficient documentation

## 2021-05-12 DIAGNOSIS — N186 End stage renal disease: Secondary | ICD-10-CM | POA: Diagnosis not present

## 2021-05-12 DIAGNOSIS — I132 Hypertensive heart and chronic kidney disease with heart failure and with stage 5 chronic kidney disease, or end stage renal disease: Secondary | ICD-10-CM | POA: Insufficient documentation

## 2021-05-12 DIAGNOSIS — Z992 Dependence on renal dialysis: Secondary | ICD-10-CM | POA: Insufficient documentation

## 2021-05-12 DIAGNOSIS — R079 Chest pain, unspecified: Secondary | ICD-10-CM | POA: Diagnosis not present

## 2021-05-12 DIAGNOSIS — E1010 Type 1 diabetes mellitus with ketoacidosis without coma: Secondary | ICD-10-CM | POA: Diagnosis not present

## 2021-05-12 DIAGNOSIS — Z87891 Personal history of nicotine dependence: Secondary | ICD-10-CM | POA: Insufficient documentation

## 2021-05-12 DIAGNOSIS — Z79899 Other long term (current) drug therapy: Secondary | ICD-10-CM | POA: Diagnosis not present

## 2021-05-12 DIAGNOSIS — M25511 Pain in right shoulder: Secondary | ICD-10-CM | POA: Insufficient documentation

## 2021-05-12 DIAGNOSIS — I5022 Chronic systolic (congestive) heart failure: Secondary | ICD-10-CM | POA: Insufficient documentation

## 2021-05-12 DIAGNOSIS — E1022 Type 1 diabetes mellitus with diabetic chronic kidney disease: Secondary | ICD-10-CM | POA: Diagnosis not present

## 2021-05-12 LAB — CBC WITH DIFFERENTIAL/PLATELET
Abs Immature Granulocytes: 0.01 10*3/uL (ref 0.00–0.07)
Basophils Absolute: 0 10*3/uL (ref 0.0–0.1)
Basophils Relative: 1 %
Eosinophils Absolute: 0.1 10*3/uL (ref 0.0–0.5)
Eosinophils Relative: 3 %
HCT: 23.7 % — ABNORMAL LOW (ref 36.0–46.0)
Hemoglobin: 7.3 g/dL — ABNORMAL LOW (ref 12.0–15.0)
Immature Granulocytes: 0 %
Lymphocytes Relative: 32 %
Lymphs Abs: 1.1 10*3/uL (ref 0.7–4.0)
MCH: 30.5 pg (ref 26.0–34.0)
MCHC: 30.8 g/dL (ref 30.0–36.0)
MCV: 99.2 fL (ref 80.0–100.0)
Monocytes Absolute: 0.2 10*3/uL (ref 0.1–1.0)
Monocytes Relative: 5 %
Neutro Abs: 2.1 10*3/uL (ref 1.7–7.7)
Neutrophils Relative %: 59 %
Platelets: 273 10*3/uL (ref 150–400)
RBC: 2.39 MIL/uL — ABNORMAL LOW (ref 3.87–5.11)
RDW: 11.6 % (ref 11.5–15.5)
WBC: 3.6 10*3/uL — ABNORMAL LOW (ref 4.0–10.5)
nRBC: 0 % (ref 0.0–0.2)

## 2021-05-12 LAB — COMPREHENSIVE METABOLIC PANEL
ALT: 6 U/L (ref 0–44)
AST: 14 U/L — ABNORMAL LOW (ref 15–41)
Albumin: 1.8 g/dL — ABNORMAL LOW (ref 3.5–5.0)
Alkaline Phosphatase: 61 U/L (ref 38–126)
Anion gap: 6 (ref 5–15)
BUN: 33 mg/dL — ABNORMAL HIGH (ref 6–20)
CO2: 24 mmol/L (ref 22–32)
Calcium: 7.9 mg/dL — ABNORMAL LOW (ref 8.9–10.3)
Chloride: 107 mmol/L (ref 98–111)
Creatinine, Ser: 4.64 mg/dL — ABNORMAL HIGH (ref 0.44–1.00)
GFR, Estimated: 13 mL/min — ABNORMAL LOW (ref >60)
Glucose, Bld: 166 mg/dL — ABNORMAL HIGH (ref 70–99)
Potassium: 5.1 mmol/L (ref 3.5–5.1)
Sodium: 137 mmol/L (ref 135–145)
Total Bilirubin: 0.5 mg/dL (ref 0.3–1.2)
Total Protein: 4.8 g/dL — ABNORMAL LOW (ref 6.5–8.1)

## 2021-05-12 LAB — LIPASE, BLOOD: Lipase: 19 U/L (ref 11–51)

## 2021-05-12 LAB — I-STAT BETA HCG BLOOD, ED (MC, WL, AP ONLY): I-stat hCG, quantitative: <5 m[IU]/mL (ref <5)

## 2021-05-12 LAB — TROPONIN I (HIGH SENSITIVITY): Troponin I (High Sensitivity): 10 ng/L (ref <18)

## 2021-05-12 NOTE — ED Triage Notes (Signed)
Pt BIB EMS, c/o right shoulder pain that started while receiving dialysis today. Pt went home, took a nap and was awoken by an abdominal cramp. C/o nausea/vomiting x 2 days.

## 2021-05-12 NOTE — ED Provider Notes (Signed)
Emergency Medicine Provider Triage Evaluation Note  Kathy Lewis , a 25 y.o. female  was evaluated in triage.  Pt complains of right shoulder/chest pain. States she went to dialysis for the first time today. During tx she experienced cramping/pain to the right shoulder. After completing tx and getting home her pain worsened and radiated down the right arm, chest and epigastrium. She further reports nv for the last 2 days. .  Review of Systems  Positive: Nv, shoulder/arm/chest pain, abd pain Negative: cough  Physical Exam  BP (!) 148/100 (BP Location: Left Arm)   Pulse 92   Temp 98.4 F (36.9 C) (Oral)   Resp 16   SpO2 100%  Gen:   Awake, no distress   Resp:  Normal effort  MSK:   Moves extremities without difficulty  Other:  Mild abd ttp, heart with rrr, lungs ctab  Medical Decision Making  Medically screening exam initiated at 6:44 PM.  Appropriate orders placed.  Kathy Lewis was informed that the remainder of the evaluation will be completed by another provider, this initial triage assessment does not replace that evaluation, and the importance of remaining in the ED until their evaluation is complete.     Kathy Booze, PA-C 05/12/21 1846    Kathy Bo, MD 05/14/21 1145

## 2021-05-13 LAB — TROPONIN I (HIGH SENSITIVITY): Troponin I (High Sensitivity): 10 ng/L (ref <18)

## 2021-05-13 MED ORDER — HYDROCODONE-ACETAMINOPHEN 5-325 MG PO TABS: 1.0000 | ORAL_TABLET | Freq: Four times a day (QID) | ORAL | 0 refills | Status: DC | PRN

## 2021-05-13 MED ORDER — HYDROCODONE-ACETAMINOPHEN 5-325 MG PO TABS
2.0000 | ORAL_TABLET | Freq: Once | ORAL | Status: AC
  Administered 2021-05-13: 2 via ORAL
  Filled 2021-05-13: qty 2

## 2021-05-13 NOTE — ED Notes (Signed)
Patient verbalizes understanding of discharge instructions. Prescriptions reviewed. Opportunity for questioning and answers were provided. Armband removed by staff, pt discharged from ED ambulatory.

## 2021-05-13 NOTE — Discharge Instructions (Addendum)
Begin taking hydrocodone as needed for pain.  Return to the emergency department if you develop severe abdominal pain, severe chest pain, high fevers, or other new and concerning symptoms.

## 2021-05-13 NOTE — ED Notes (Signed)
Pt states that the pain comes and goes. It stays at a three but will randomly intensify. Pain in mostly in the right shoulder. States that earlier her arm went cold and numb. Feeling has returned. Arm is warm to touch and no discoloration noted at this time.

## 2021-05-13 NOTE — ED Provider Notes (Signed)
[facility] EMERGENCY DEPARTMENT Provider Note   CSN: [id number] Arrival date & time: 05/12/21  1838     History Chief Complaint  Patient presents with   Shoulder Pain    Kathy Lewis is a 25 y.o. female.  Patient is a 25 year old female with past medical history of diabetes, bipolar disorder, end-stage renal disease on peritoneal dialysis.  Patient underwent her first peritoneal dialysis session yesterday.  During this session, she developed pain to her anterior right shoulder and right lower chest.  This pain waxed and waned throughout the session.  She denies any fevers or chills.  She denies any difficulty breathing.  The history is provided by the patient.      Past Medical History:  Diagnosis Date   Anxiety    Bipolar 2 disorder ([place])    CHF (congestive heart failure) ([place])    CKD (chronic kidney disease), stage III ([place])    Depression    DKA (diabetic ketoacidoses)    HSV infection    on valtrex   Hypokalemia    Leukocytosis    Migraine    Noncompliance with medication regimen    Preeclampsia    Severe anemia    Type 1 diabetes mellitus ([place])     Patient Active Problem List   Diagnosis Date Noted   Bipolar 2 disorder ([place])    HTN (hypertension)    Acute renal failure superimposed on stage 4 chronic kidney disease ([place]) 99991111   Chronic systolic CHF (congestive heart failure) ([place]) 07/11/2020   CKD (chronic kidney disease), stage III ([facility])    Hypoalbuminemia    Anxiety    Normocytic anemia    Borderline prolonged QT interval    CHF exacerbation ([place]) 07/10/2020   Fever 10/28/2019   Preeclampsia 10/15/2019   Severe anemia 10/13/2019   Pre-existing type 1 diabetes mellitus with hyperglycemia during pregnancy in first trimester ([place]) 04/30/2019   Hypokalemia    DKA (diabetic ketoacidoses) 12/18/2014   Leukocytosis 12/18/2014   Noncompliance with medications 12/18/2014   Type 1 diabetes mellitus with ketoacidosis, uncontrolled  ([place]) 12/18/2014   Nausea and vomiting 12/18/2014    Past Surgical History:  Procedure Laterality Date   CARDIAC CATHETERIZATION     DILATION AND EVACUATION N/A 10/22/2019   Procedure: ULTRASOUND GUIDED DILATATION AND EVACUATION;  Surgeon: [name], MD;  Location: [facility] LD ORS;  Service: Gynecology;  Laterality: N/A;   NO PAST SURGERIES     peritoneal dialysis catheter insertion     RENAL BIOPSY       OB History     Gravida  2   Para  1   Term      Preterm  1   AB  1   Living  1      SAB  1   IAB      Ectopic      Multiple  0   Live Births  1           Family History  Adopted: Yes  Family history unknown: Yes    Social History   Tobacco Use   Smoking status: Former    Packs/day: 1.00    Types: Cigars, Cigarettes   Smokeless tobacco: Never  Vaping Use   Vaping Use: Never used  Substance Use Topics   Alcohol use: No   Drug use: No    Home Medications Prior to Admission medications   Medication Sig Start Date End Date Taking? Authorizing Provider  acetaminophen (TYLENOL) 500  MG tablet Take 1,000 mg by mouth every 6 (six) hours as needed for moderate pain.    [provider]  albuterol (VENTOLIN HFA) 108 (90 Base) MCG/ACT inhaler Inhale 2 puffs into the lungs every 6 (six) hours as needed for wheezing or shortness of breath.  01/15/20 02/16/21  [provider]  benzonatate (TESSALON) 100 MG capsule Take 100 mg by mouth 3 (three) times daily. Patient not taking: Reported on 03/29/2021    [provider]  butalbital-acetaminophen-caffeine [facility]) [phone] MG tablet Take 1 tablet by mouth daily as needed for headache. 02/18/21 02/18/22  Dwyane Dee, MD  carvedilol (COREG) 12.5 MG tablet Take 12.5 mg by mouth 2 (two) times daily with a meal.    [provider]  hydrALAZINE (APRESOLINE) 25 MG tablet Take 75 mg by mouth 3 (three) times daily.    [provider]  insulin aspart (NOVOLOG) 100 UNIT/ML  injection Inject 2 Units into the skin 3 (three) times daily with meals. Patient taking differently: Inject 2-8 Units into the skin 3 (three) times daily with meals. Per sliding 10/30/19   Christophe Louis, MD  insulin detemir (LEVEMIR) 100 UNIT/ML injection Inject 0.08 mLs (8 Units total) into the skin daily. Patient taking differently: Inject 10 Units into the skin 2 (two) times daily. 10/31/19   Christophe Louis, MD  Insulin Syringe-Needle U-100 (INSULIN SYRINGE .3CC/31GX5/16") 31G X 5/16" 0.3 ML MISC Use as directed 10/27/19   [name], MD  isosorbide mononitrate (IMDUR) 30 MG 24 hr tablet Take 30 mg by mouth daily.    [provider]  losartan (COZAAR) 25 MG tablet Take 25 mg by mouth daily.    [provider]  medroxyPROGESTERone (DEPO-PROVERA) 150 MG/ML injection Inject 1 mL (150 mg total) into the muscle every 3 (three) months. 03/29/21   Gavin Pound, CNM  oxyCODONE (ROXICODONE) 5 MG immediate release tablet Take 1 tablet (5 mg total) by mouth every 6 (six) hours as needed for up to 15 doses for severe pain. 05/07/21   Curatolo, Adam, DO  potassium chloride (KLOR-CON) 10 MEQ tablet Take 1 tablet (10 mEq total) by mouth daily. 10/27/19   [name], MD  torsemide (DEMADEX) 20 MG tablet Take 20-40 mg by mouth See admin instructions. Takes 2 tablets in the morning and 1 tablet at lunch    [provider]  Vitamin D, Ergocalciferol, (DRISDOL) 1.25 MG (50000 UNIT) CAPS capsule Take 50,000 Units by mouth every 7 (seven) days.    [provider]  escitalopram (LEXAPRO) 10 MG tablet Take 20 mg by mouth daily.  05/10/20 10/06/20  [provider]  furosemide (LASIX) 40 MG tablet Take 1 tablet (40 mg total) by mouth daily. Patient not taking: No sig reported 10/28/19 02/17/21  [name], MD  lisinopril (ZESTRIL) 10 MG tablet Take 10 mg by mouth daily.  02/23/20 10/06/20  [provider]  spironolactone (ALDACTONE) 25 MG tablet Take 25 mg by mouth daily.  04/20/20  10/06/20  [provider]    Allergies    Cantaloupe extract allergy skin test, Strawberry extract, Citrullus vulgaris, and Nsaids  Review of Systems   Review of Systems  All other systems reviewed and are negative.  Physical Exam Updated Vital Signs BP (!) 158/106 (BP Location: Left Arm)   Pulse 93   Temp 99.3 F (37.4 C) (Oral)   Resp 14   SpO2 100%   Physical Exam Vitals and nursing note reviewed.  Constitutional:      General:  She is not in acute distress.    Appearance: She is well-developed. She is not diaphoretic.  HENT:     Head: Normocephalic and atraumatic.  Cardiovascular:     Rate and Rhythm: Normal rate and regular rhythm.     Heart sounds: No murmur heard.   No friction rub. No gallop.  Pulmonary:     Effort: Pulmonary effort is normal. No respiratory distress.     Breath sounds: Normal breath sounds. No wheezing.     Comments: There is tenderness to palpation of the anterior right shoulder and lower chest.  She has good range of motion of the shoulder with no discomfort.  Ulnar and radial pulses are easily palpable and motor and sensation are intact throughout the entire hand.  There is no swelling of the arm. Abdominal:     General: Bowel sounds are normal. There is no distension.     Palpations: Abdomen is soft.     Tenderness: There is no abdominal tenderness.  Musculoskeletal:        General: Normal range of motion.     Cervical back: Normal range of motion and neck supple.  Skin:    General: Skin is warm and dry.  Neurological:     General: No focal deficit present.     Mental Status: She is alert and oriented to person, place, and time.    ED Results / Procedures / Treatments   Labs (all labs ordered are listed, but only abnormal results are displayed) Labs Reviewed  CBC WITH DIFFERENTIAL/PLATELET - Abnormal; Notable for the following components:      Result Value   WBC 3.6 (*)    RBC 2.39 (*)    Hemoglobin 7.3 (*)    HCT 23.7 (*)     All other components within normal limits  COMPREHENSIVE METABOLIC PANEL - Abnormal; Notable for the following components:   Glucose, Bld 166 (*)    BUN 33 (*)    Creatinine, Ser 4.64 (*)    Calcium 7.9 (*)    Total Protein 4.8 (*)    Albumin 1.8 (*)    AST 14 (*)    GFR, Estimated 13 (*)    All other components within normal limits  LIPASE, BLOOD  I-STAT BETA HCG BLOOD, ED ([facility], [facility], [facility] ONLY)  TROPONIN I (HIGH SENSITIVITY)  TROPONIN I (HIGH SENSITIVITY)    EKG None  Radiology DG Chest 2 View  Result Date: 05/12/2021 CLINICAL DATA:  Chest pain. EXAM: CHEST - 2 VIEW COMPARISON:  02/16/2021 FINDINGS: The heart size and mediastinal contours are within normal limits. Both lungs are clear. Small amount of free intraperitoneal air is seen beneath both hemidiaphragms. IMPRESSION: No active cardiopulmonary disease. Small amount of free intraperitoneal air. These results were called by telephone at the time of interpretation on 05/12/2021 at 7:26 pm to provider [facility] , who verbally acknowledged these results. This finding is likely due to the fact the patient is on peritoneal dialysis. Electronically Signed   By: Marlaine Hind M.D.   On: 05/12/2021 19:26    Procedures Procedures   Medications Ordered in ED Medications  HYDROcodone-acetaminophen (NORCO/VICODIN) 5-325 MG per tablet 2 tablet (has no administration in time range)    ED Course  I have reviewed the triage vital signs and the nursing notes.  Pertinent labs & imaging results that were available during my care of the patient were reviewed by me and considered in my medical decision making (see chart for details).  MDM Rules/Calculators/A&P  Patient is a 25 year old female presenting with pain in her right shoulder and lower anterior chest.  This started while she was performing her first peritoneal dialysis session.  Patient arrives here nontoxic in appearance.  She is afebrile and vital signs are unremarkable.   Her EKG is unchanged and troponin x2 is negative.  Examination of the upper extremities is unremarkable.  She has no swelling and is neurologically intact.  She has good pulses.  I suspect the diastole use during the peritoneal dialysis may have caused irritation to her diaphragm as nothing else in the work-up is abnormal.  Patient given Norco here in the ED.  I feel as though she can safely be discharged with as needed return.  Final Clinical Impression(s) / ED Diagnoses Final diagnoses:  None    Rx / DC Orders ED Discharge Orders     None        Veryl Speak, MD 05/13/21 [phone]

## 2021-06-16 ENCOUNTER — Ambulatory Visit: Payer: Medicaid Other

## 2021-08-10 ENCOUNTER — Other Ambulatory Visit: Payer: Self-pay

## 2021-08-10 ENCOUNTER — Emergency Department (HOSPITAL_COMMUNITY)
Admission: EM | Admit: 2021-08-10 | Discharge: 2021-08-10 | Disposition: A | Discharge status: Home / Self Care | Payer: Medicaid Other | Source: Home / Self Care | Attending: Emergency Medicine | Admitting: Emergency Medicine

## 2021-08-10 ENCOUNTER — Encounter (HOSPITAL_COMMUNITY): Payer: Self-pay

## 2021-08-10 DIAGNOSIS — J09X9 Influenza due to identified novel influenza A virus with other manifestations: Secondary | ICD-10-CM | POA: Diagnosis not present

## 2021-08-10 DIAGNOSIS — N185 Chronic kidney disease, stage 5: Secondary | ICD-10-CM | POA: Diagnosis not present

## 2021-08-10 DIAGNOSIS — Z5321 Procedure and treatment not carried out due to patient leaving prior to being seen by health care provider: Secondary | ICD-10-CM | POA: Insufficient documentation

## 2021-08-10 DIAGNOSIS — E1011 Type 1 diabetes mellitus with ketoacidosis with coma: Secondary | ICD-10-CM | POA: Diagnosis not present

## 2021-08-10 DIAGNOSIS — I4581 Long QT syndrome: Secondary | ICD-10-CM | POA: Diagnosis not present

## 2021-08-10 DIAGNOSIS — I132 Hypertensive heart and chronic kidney disease with heart failure and with stage 5 chronic kidney disease, or end stage renal disease: Secondary | ICD-10-CM | POA: Diagnosis not present

## 2021-08-10 DIAGNOSIS — R1110 Vomiting, unspecified: Secondary | ICD-10-CM | POA: Diagnosis present

## 2021-08-10 DIAGNOSIS — Z79899 Other long term (current) drug therapy: Secondary | ICD-10-CM | POA: Diagnosis not present

## 2021-08-10 DIAGNOSIS — R110 Nausea: Secondary | ICD-10-CM | POA: Insufficient documentation

## 2021-08-10 DIAGNOSIS — I5023 Acute on chronic systolic (congestive) heart failure: Secondary | ICD-10-CM | POA: Diagnosis not present

## 2021-08-10 DIAGNOSIS — E875 Hyperkalemia: Secondary | ICD-10-CM | POA: Diagnosis not present

## 2021-08-10 DIAGNOSIS — R739 Hyperglycemia, unspecified: Secondary | ICD-10-CM | POA: Insufficient documentation

## 2021-08-10 DIAGNOSIS — Z794 Long term (current) use of insulin: Secondary | ICD-10-CM | POA: Diagnosis not present

## 2021-08-10 DIAGNOSIS — Z20822 Contact with and (suspected) exposure to covid-19: Secondary | ICD-10-CM | POA: Diagnosis not present

## 2021-08-10 DIAGNOSIS — Z87891 Personal history of nicotine dependence: Secondary | ICD-10-CM | POA: Diagnosis not present

## 2021-08-10 DIAGNOSIS — R112 Nausea with vomiting, unspecified: Secondary | ICD-10-CM | POA: Diagnosis not present

## 2021-08-10 LAB — I-STAT VENOUS BLOOD GAS, ED
Acid-base deficit: 2 mmol/L (ref 0.0–2.0)
Bicarbonate: 23.2 mmol/L (ref 20.0–28.0)
Calcium, Ion: 1.06 mmol/L — ABNORMAL LOW (ref 1.15–1.40)
HCT: 22 % — ABNORMAL LOW (ref 36.0–46.0)
Hemoglobin: 7.5 g/dL — ABNORMAL LOW (ref 12.0–15.0)
O2 Saturation: 77 %
Potassium: 6.1 mmol/L — ABNORMAL HIGH (ref 3.5–5.1)
Sodium: 135 mmol/L (ref 135–145)
TCO2: 24 mmol/L (ref 22–32)
pCO2, Ven: 42 mmHg — ABNORMAL LOW (ref 44.0–60.0)
pH, Ven: 7.351 (ref 7.250–7.430)
pO2, Ven: 44 mmHg (ref 32.0–45.0)

## 2021-08-10 LAB — URINALYSIS, ROUTINE W REFLEX MICROSCOPIC
Bilirubin Urine: NEGATIVE
Glucose, UA: >=500 mg/dL — AB
Ketones, ur: 15 mg/dL — AB
Leukocytes,Ua: NEGATIVE
Nitrite: POSITIVE — AB
Protein, ur: >300 mg/dL — AB
Specific Gravity, Urine: 1.015 (ref 1.005–1.030)
pH: 6 (ref 5.0–8.0)

## 2021-08-10 LAB — COMPREHENSIVE METABOLIC PANEL
ALT: 31 U/L (ref 0–44)
AST: 45 U/L — ABNORMAL HIGH (ref 15–41)
Albumin: 2.1 g/dL — ABNORMAL LOW (ref 3.5–5.0)
Alkaline Phosphatase: 91 U/L (ref 38–126)
Anion gap: 9 (ref 5–15)
BUN: 59 mg/dL — ABNORMAL HIGH (ref 6–20)
CO2: 22 mmol/L (ref 22–32)
Calcium: 7.5 mg/dL — ABNORMAL LOW (ref 8.9–10.3)
Chloride: 103 mmol/L (ref 98–111)
Creatinine, Ser: 6.47 mg/dL — ABNORMAL HIGH (ref 0.44–1.00)
GFR, Estimated: 9 mL/min — ABNORMAL LOW (ref >60)
Glucose, Bld: 296 mg/dL — ABNORMAL HIGH (ref 70–99)
Potassium: 6.1 mmol/L — ABNORMAL HIGH (ref 3.5–5.1)
Sodium: 134 mmol/L — ABNORMAL LOW (ref 135–145)
Total Bilirubin: 0.6 mg/dL (ref 0.3–1.2)
Total Protein: 5 g/dL — ABNORMAL LOW (ref 6.5–8.1)

## 2021-08-10 LAB — CBC WITH DIFFERENTIAL/PLATELET
Abs Immature Granulocytes: 0.01 10*3/uL (ref 0.00–0.07)
Basophils Absolute: 0 10*3/uL (ref 0.0–0.1)
Basophils Relative: 1 %
Eosinophils Absolute: 0 10*3/uL (ref 0.0–0.5)
Eosinophils Relative: 0 %
HCT: 23.2 % — ABNORMAL LOW (ref 36.0–46.0)
Hemoglobin: 7.2 g/dL — ABNORMAL LOW (ref 12.0–15.0)
Immature Granulocytes: 0 %
Lymphocytes Relative: 13 %
Lymphs Abs: 0.4 10*3/uL — ABNORMAL LOW (ref 0.7–4.0)
MCH: 31.3 pg (ref 26.0–34.0)
MCHC: 31 g/dL (ref 30.0–36.0)
MCV: 100.9 fL — ABNORMAL HIGH (ref 80.0–100.0)
Monocytes Absolute: 0.1 10*3/uL (ref 0.1–1.0)
Monocytes Relative: 4 %
Neutro Abs: 2.3 10*3/uL (ref 1.7–7.7)
Neutrophils Relative %: 82 %
Platelets: 208 10*3/uL (ref 150–400)
RBC: 2.3 MIL/uL — ABNORMAL LOW (ref 3.87–5.11)
RDW: 13.2 % (ref 11.5–15.5)
WBC: 2.8 10*3/uL — ABNORMAL LOW (ref 4.0–10.5)
nRBC: 0 % (ref 0.0–0.2)

## 2021-08-10 LAB — I-STAT BETA HCG BLOOD, ED (MC, WL, AP ONLY): I-stat hCG, quantitative: <5 m[IU]/mL (ref <5)

## 2021-08-10 LAB — URINALYSIS, MICROSCOPIC (REFLEX)

## 2021-08-10 LAB — CBG MONITORING, ED: Glucose-Capillary: 262 mg/dL — ABNORMAL HIGH (ref 70–99)

## 2021-08-10 NOTE — ED Provider Notes (Signed)
Emergency Medicine Provider Triage Evaluation Note  Shaughnessy Gethers , a 25 y.o. female  was evaluated in triage.  Pt complains of hyperglycemia.  States her blood sugars have been "all over the place."  Was in the 90s this morning, was feeling generally weak, checked her blood sugars this evening which were in the 200s, by the time she called EMS in the 400s.  States she has had some NBNB emesis.  Feels generally weak.  No recent sick contacts.  Review of Systems  Positive: Hyperglycemia, nausea, vomiting Negative: Fever  Physical Exam  BP (!) 133/97 (BP Location: Right Arm)    Pulse 89    Temp 99 F (37.2 C) (Oral)    Resp 17    SpO2 99%  Gen:   Awake, no distress   Resp:  Normal effort  MSK:   Moves extremities without difficulty  Other:    Medical Decision Making  Medically screening exam initiated at 2:01 AM.  Appropriate orders placed.  Isaac Lacson was informed that the remainder of the evaluation will be completed by another provider, this initial triage assessment does not replace that evaluation, and the importance of remaining in the ED until their evaluation is complete.  Hyperglycemia   Velva Molinari A, PA-C 08/10/21 0201    Veryl Speak, MD 08/10/21 [phone]

## 2021-08-10 NOTE — ED Triage Notes (Signed)
Pt here because she is hyperglycemic. Per medic, her blood sugar is 424. Pt also stated that she is out of her zofran. Pt states that her nausea has been ongoing for the past couple of hours. Vitals per medic: 127/90, HR: 96, RR: 14, 100% (RA). 20G  IV in LAC and 4 mg zofran given PTA.

## 2021-08-10 NOTE — ED Notes (Signed)
Pt decided to leave while waiting for a room.  

## 2021-08-11 ENCOUNTER — Encounter (HOSPITAL_BASED_OUTPATIENT_CLINIC_OR_DEPARTMENT_OTHER): Payer: Self-pay

## 2021-08-11 ENCOUNTER — Observation Stay (HOSPITAL_BASED_OUTPATIENT_CLINIC_OR_DEPARTMENT_OTHER)
Admission: EM | Admit: 2021-08-11 | Discharge: 2021-08-12 | Disposition: A | Discharge status: Home / Self Care | Payer: Medicaid Other | Attending: Internal Medicine | Admitting: Internal Medicine

## 2021-08-11 ENCOUNTER — Other Ambulatory Visit: Payer: Self-pay

## 2021-08-11 ENCOUNTER — Emergency Department (HOSPITAL_BASED_OUTPATIENT_CLINIC_OR_DEPARTMENT_OTHER): Payer: Medicaid Other

## 2021-08-11 DIAGNOSIS — J101 Influenza due to other identified influenza virus with other respiratory manifestations: Secondary | ICD-10-CM | POA: Diagnosis not present

## 2021-08-11 DIAGNOSIS — R9431 Abnormal electrocardiogram [ECG] [EKG]: Secondary | ICD-10-CM | POA: Diagnosis present

## 2021-08-11 DIAGNOSIS — E1010 Type 1 diabetes mellitus with ketoacidosis without coma: Secondary | ICD-10-CM

## 2021-08-11 DIAGNOSIS — Z20822 Contact with and (suspected) exposure to covid-19: Secondary | ICD-10-CM | POA: Insufficient documentation

## 2021-08-11 DIAGNOSIS — N185 Chronic kidney disease, stage 5: Secondary | ICD-10-CM | POA: Insufficient documentation

## 2021-08-11 DIAGNOSIS — I10 Essential (primary) hypertension: Secondary | ICD-10-CM | POA: Diagnosis present

## 2021-08-11 DIAGNOSIS — R112 Nausea with vomiting, unspecified: Secondary | ICD-10-CM | POA: Diagnosis present

## 2021-08-11 DIAGNOSIS — Z79899 Other long term (current) drug therapy: Secondary | ICD-10-CM | POA: Insufficient documentation

## 2021-08-11 DIAGNOSIS — I4581 Long QT syndrome: Secondary | ICD-10-CM | POA: Insufficient documentation

## 2021-08-11 DIAGNOSIS — I132 Hypertensive heart and chronic kidney disease with heart failure and with stage 5 chronic kidney disease, or end stage renal disease: Secondary | ICD-10-CM | POA: Insufficient documentation

## 2021-08-11 DIAGNOSIS — E876 Hypokalemia: Secondary | ICD-10-CM | POA: Diagnosis present

## 2021-08-11 DIAGNOSIS — E1011 Type 1 diabetes mellitus with ketoacidosis with coma: Secondary | ICD-10-CM | POA: Insufficient documentation

## 2021-08-11 DIAGNOSIS — J09X9 Influenza due to identified novel influenza A virus with other manifestations: Secondary | ICD-10-CM | POA: Insufficient documentation

## 2021-08-11 DIAGNOSIS — Z87891 Personal history of nicotine dependence: Secondary | ICD-10-CM | POA: Insufficient documentation

## 2021-08-11 DIAGNOSIS — I5023 Acute on chronic systolic (congestive) heart failure: Secondary | ICD-10-CM | POA: Diagnosis not present

## 2021-08-11 DIAGNOSIS — Z794 Long term (current) use of insulin: Secondary | ICD-10-CM | POA: Insufficient documentation

## 2021-08-11 DIAGNOSIS — E875 Hyperkalemia: Secondary | ICD-10-CM | POA: Insufficient documentation

## 2021-08-11 LAB — URINALYSIS, ROUTINE W REFLEX MICROSCOPIC
Bilirubin Urine: NEGATIVE
Glucose, UA: >=500 mg/dL — AB
Ketones, ur: NEGATIVE mg/dL
Leukocytes,Ua: NEGATIVE
Nitrite: NEGATIVE
Protein, ur: >300 mg/dL — AB
Specific Gravity, Urine: 1.02 (ref 1.005–1.030)
pH: 6 (ref 5.0–8.0)

## 2021-08-11 LAB — COMPREHENSIVE METABOLIC PANEL
ALT: 35 U/L (ref 0–44)
AST: 40 U/L (ref 15–41)
Albumin: 2.1 g/dL — ABNORMAL LOW (ref 3.5–5.0)
Alkaline Phosphatase: 97 U/L (ref 38–126)
Anion gap: 10 (ref 5–15)
BUN: 70 mg/dL — ABNORMAL HIGH (ref 6–20)
CO2: 21 mmol/L — ABNORMAL LOW (ref 22–32)
Calcium: 7.3 mg/dL — ABNORMAL LOW (ref 8.9–10.3)
Chloride: 101 mmol/L (ref 98–111)
Creatinine, Ser: 6.55 mg/dL — ABNORMAL HIGH (ref 0.44–1.00)
GFR, Estimated: 8 mL/min — ABNORMAL LOW (ref >60)
Glucose, Bld: 299 mg/dL — ABNORMAL HIGH (ref 70–99)
Potassium: 5.7 mmol/L — ABNORMAL HIGH (ref 3.5–5.1)
Sodium: 132 mmol/L — ABNORMAL LOW (ref 135–145)
Total Bilirubin: 0.3 mg/dL (ref 0.3–1.2)
Total Protein: 4.8 g/dL — ABNORMAL LOW (ref 6.5–8.1)

## 2021-08-11 LAB — RESP PANEL BY RT-PCR (FLU A&B, COVID) ARPGX2
Influenza A by PCR: POSITIVE — AB
Influenza B by PCR: NEGATIVE
SARS Coronavirus 2 by RT PCR: NEGATIVE

## 2021-08-11 LAB — CBC WITH DIFFERENTIAL/PLATELET
Abs Immature Granulocytes: 0.01 10*3/uL (ref 0.00–0.07)
Basophils Absolute: 0 10*3/uL (ref 0.0–0.1)
Basophils Relative: 1 %
Eosinophils Absolute: 0 10*3/uL (ref 0.0–0.5)
Eosinophils Relative: 0 %
HCT: 23.7 % — ABNORMAL LOW (ref 36.0–46.0)
Hemoglobin: 7.7 g/dL — ABNORMAL LOW (ref 12.0–15.0)
Immature Granulocytes: 0 %
Lymphocytes Relative: 32 %
Lymphs Abs: 1 10*3/uL (ref 0.7–4.0)
MCH: 31.4 pg (ref 26.0–34.0)
MCHC: 32.5 g/dL (ref 30.0–36.0)
MCV: 96.7 fL (ref 80.0–100.0)
Monocytes Absolute: 0.2 10*3/uL (ref 0.1–1.0)
Monocytes Relative: 7 %
Neutro Abs: 1.9 10*3/uL (ref 1.7–7.7)
Neutrophils Relative %: 60 %
Platelets: 231 10*3/uL (ref 150–400)
RBC: 2.45 MIL/uL — ABNORMAL LOW (ref 3.87–5.11)
RDW: 13.2 % (ref 11.5–15.5)
WBC: 3.2 10*3/uL — ABNORMAL LOW (ref 4.0–10.5)
nRBC: 0 % (ref 0.0–0.2)

## 2021-08-11 LAB — TROPONIN I (HIGH SENSITIVITY)
Troponin I (High Sensitivity): 11 ng/L (ref <18)
Troponin I (High Sensitivity): 11 ng/L (ref <18)

## 2021-08-11 LAB — URINALYSIS, MICROSCOPIC (REFLEX)

## 2021-08-11 LAB — GLUCOSE, CAPILLARY
Glucose-Capillary: 230 mg/dL — ABNORMAL HIGH (ref 70–99)
Glucose-Capillary: 267 mg/dL — ABNORMAL HIGH (ref 70–99)

## 2021-08-11 LAB — BRAIN NATRIURETIC PEPTIDE: B Natriuretic Peptide: 1278.5 pg/mL — ABNORMAL HIGH (ref 0.0–100.0)

## 2021-08-11 LAB — CBG MONITORING, ED: Glucose-Capillary: 276 mg/dL — ABNORMAL HIGH (ref 70–99)

## 2021-08-11 LAB — HCG, SERUM, QUALITATIVE: Preg, Serum: NEGATIVE

## 2021-08-11 LAB — MAGNESIUM: Magnesium: 2.1 mg/dL (ref 1.7–2.4)

## 2021-08-11 LAB — LIPASE, BLOOD: Lipase: 23 U/L (ref 11–51)

## 2021-08-11 MED ORDER — BUTALBITAL-APAP-CAFFEINE 50-325-40 MG PO TABS: 1.0000 | ORAL_TABLET | Freq: Every day | ORAL | Status: DC | PRN

## 2021-08-11 MED ORDER — OSELTAMIVIR PHOSPHATE 75 MG PO CAPS
75.0000 mg | ORAL_CAPSULE | Freq: Once | ORAL | Status: AC
  Administered 2021-08-11: 75 mg via ORAL
  Filled 2021-08-11: qty 1

## 2021-08-11 MED ORDER — ISOSORBIDE MONONITRATE ER 30 MG PO TB24
30.0000 mg | ORAL_TABLET | Freq: Every day | ORAL | Status: DC
  Administered 2021-08-12: 30 mg via ORAL
  Filled 2021-08-11: qty 1

## 2021-08-11 MED ORDER — LORAZEPAM 2 MG/ML IJ SOLN
1.0000 mg | Freq: Once | INTRAMUSCULAR | Status: AC
  Administered 2021-08-11: 1 mg via INTRAVENOUS
  Filled 2021-08-11: qty 1

## 2021-08-11 MED ORDER — SODIUM CHLORIDE 0.9% FLUSH
3.0000 mL | INTRAVENOUS | Status: DC | PRN
  Administered 2021-08-11: 3 mL via INTRAVENOUS

## 2021-08-11 MED ORDER — HYDRALAZINE HCL 50 MG PO TABS
100.0000 mg | ORAL_TABLET | Freq: Three times a day (TID) | ORAL | Status: DC
  Administered 2021-08-11 – 2021-08-12 (×2): 100 mg via ORAL
  Filled 2021-08-11 (×2): qty 2

## 2021-08-11 MED ORDER — CARVEDILOL 12.5 MG PO TABS
12.5000 mg | ORAL_TABLET | Freq: Two times a day (BID) | ORAL | Status: DC
  Administered 2021-08-11 – 2021-08-12 (×2): 12.5 mg via ORAL
  Filled 2021-08-11 (×2): qty 1

## 2021-08-11 MED ORDER — SODIUM CHLORIDE 0.9% FLUSH
3.0000 mL | Freq: Two times a day (BID) | INTRAVENOUS | Status: DC
  Administered 2021-08-12: 3 mL via INTRAVENOUS

## 2021-08-11 MED ORDER — GENTAMICIN SULFATE 0.1 % EX CREA
1.0000 "application " | TOPICAL_CREAM | Freq: Every day | CUTANEOUS | Status: DC
  Administered 2021-08-11 – 2021-08-12 (×2): 1 via TOPICAL
  Filled 2021-08-11: qty 15

## 2021-08-11 MED ORDER — ACETAMINOPHEN 325 MG PO TABS: 650.0000 mg | ORAL_TABLET | ORAL | Status: DC | PRN

## 2021-08-11 MED ORDER — DELFLEX-LC/1.5% DEXTROSE 344 MOSM/L IP SOLN
INTRAPERITONEAL | Status: DC
  Administered 2021-08-11: 5000 mL via INTRAPERITONEAL

## 2021-08-11 MED ORDER — SODIUM CHLORIDE 0.9 % IV SOLN: 250.0000 mL | INTRAVENOUS | Status: DC | PRN

## 2021-08-11 MED ORDER — SODIUM ZIRCONIUM CYCLOSILICATE 10 G PO PACK
10.0000 g | PACK | Freq: Once | ORAL | Status: AC
  Administered 2021-08-11: 10 g via ORAL
  Filled 2021-08-11: qty 1

## 2021-08-11 MED ORDER — HEPARIN SODIUM (PORCINE) 5000 UNIT/ML IJ SOLN
5000.0000 [IU] | Freq: Three times a day (TID) | INTRAMUSCULAR | Status: DC
  Administered 2021-08-11 – 2021-08-12 (×2): 5000 [IU] via SUBCUTANEOUS
  Filled 2021-08-11 (×2): qty 1

## 2021-08-11 MED ORDER — METHOCARBAMOL 500 MG PO TABS
500.0000 mg | ORAL_TABLET | Freq: Four times a day (QID) | ORAL | Status: DC
  Administered 2021-08-11 – 2021-08-12 (×2): 500 mg via ORAL
  Filled 2021-08-11 (×3): qty 1

## 2021-08-11 MED ORDER — ATORVASTATIN CALCIUM 80 MG PO TABS
80.0000 mg | ORAL_TABLET | Freq: Every day | ORAL | Status: DC
  Administered 2021-08-12: 80 mg via ORAL
  Filled 2021-08-11: qty 1

## 2021-08-11 MED ORDER — CALCITRIOL 0.25 MCG PO CAPS
0.2500 ug | ORAL_CAPSULE | Freq: Every day | ORAL | Status: DC
  Administered 2021-08-12: 0.25 ug via ORAL
  Filled 2021-08-11: qty 1

## 2021-08-11 MED ORDER — SODIUM ZIRCONIUM CYCLOSILICATE 10 G PO PACK
10.0000 g | PACK | Freq: Three times a day (TID) | ORAL | Status: DC
  Filled 2021-08-11: qty 1

## 2021-08-11 MED ORDER — LORAZEPAM 2 MG/ML IJ SOLN: 0.5000 mg | INTRAMUSCULAR | Status: DC | PRN

## 2021-08-11 MED ORDER — INSULIN DETEMIR 100 UNIT/ML ~~LOC~~ SOLN
10.0000 [IU] | Freq: Two times a day (BID) | SUBCUTANEOUS | Status: DC
  Administered 2021-08-11 – 2021-08-12 (×2): 10 [IU] via SUBCUTANEOUS
  Filled 2021-08-11 (×3): qty 0.1

## 2021-08-11 MED ORDER — DELFLEX-LC/2.5% DEXTROSE 394 MOSM/L IP SOLN
INTRAPERITONEAL | Status: DC
  Administered 2021-08-11: 5000 mL via INTRAPERITONEAL

## 2021-08-11 MED ORDER — FUROSEMIDE 10 MG/ML IJ SOLN
40.0000 mg | Freq: Once | INTRAMUSCULAR | Status: AC
  Administered 2021-08-11: 40 mg via INTRAVENOUS
  Filled 2021-08-11: qty 4

## 2021-08-11 MED ORDER — INSULIN ASPART 100 UNIT/ML IJ SOLN
0.0000 [IU] | INTRAMUSCULAR | Status: DC
  Administered 2021-08-11 – 2021-08-12 (×2): 3 [IU] via SUBCUTANEOUS
  Administered 2021-08-12: 1 [IU] via SUBCUTANEOUS

## 2021-08-11 MED ORDER — FUROSEMIDE 10 MG/ML IJ SOLN: 40.0000 mg | Freq: Two times a day (BID) | INTRAMUSCULAR | Status: DC

## 2021-08-11 NOTE — ED Triage Notes (Signed)
Pt c/o vomiting x 2 days. States unable to tolerate PO. Chest pain in center of chest. Went to [facility] yesterday, left AMA d/t wait. Hyperglycemic yesterday. States hx of CHF, CKD & diabetes.Taking zofran ODT at home without relief. Daughter diagnosed with flu 2 days ago.

## 2021-08-11 NOTE — H&P (Addendum)
History and Physical    Kathy Lewis NOM:[id number] DOB: [birth year] DOA: 08/11/2021  PCP: Medicine, [facility]   Patient coming from: Home   Chief Complaint: Hyperglycemia, N/V, chest pain   HPI: Kathy Lewis is a pleasant 25 y.o. 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  Patient reports 2 days of nausea with nonbloody vomiting, has also had a couple loose stools over this interval, but she denies any significant abdominal pain and denies any fever or chills.  Cough has been nonproductive.  She has had recent increase in mild bilateral ankle edema.  Last did PT at home on the night of 08/09/2021.  [facility] Course: Upon arrival to the ED, patient is found to be afebrile, saturating well on room air, and with normal respiratory rate, normal heart rate, and stable blood pressure.  EKG features sinus rhythm with QTc 635 ms.  Chest x-ray features cephalization of vasculature and likely mild pulmonary edema.  Chemistry panel notable for glucose 299, potassium 5.7, bicarbonate 21, BUN 70, and albumin 2.1.  CBC with WBC 3200 and hemoglobin 7.7.  BNP is elevated to 1279.  Troponin was normal twice.  Influenza A is positive.  Nephrology was consulted by the ED physician and the patient was treated with Lokelma, Lasix, Ativan, and Tamiflu.  She was transferred to [facility] for admission.  Review of Systems:  All other systems reviewed and apart from HPI, are negative.  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 Her smoking use included cigars and cigarettes. She smoked an average of 1 pack per day. She has never used smokeless tobacco. She reports that she does not drink alcohol and does not use drugs.  Allergies  Allergen Reactions   Cantaloupe Extract Allergy Skin Test Itching    Mouth itching     Strawberry Extract     Other reaction(s): Itching Mouth itches Mouth itches   Citrullus Vulgaris Other (See Comments)    Makes mouth itch , ALL melons Makes mouth itch , ALL melons   Nsaids     Avoid per nephrology    Family History  Adopted: Yes  Family history unknown: Yes     Prior to Admission medications   Medication Sig Start Date End Date Taking? Authorizing Provider  atorvastatin (LIPITOR) 40 MG tablet  Take 80 mg by mouth daily. 08/03/21  Yes [provider]  butalbital-acetaminophen-caffeine (FIORICET) 50-325-40 MG tablet Take 1 tablet by mouth daily as needed for headache. 02/18/21 02/18/22 Yes Dwyane Dee, MD  calcitRIOL (ROCALTROL) 0.25 MCG capsule Take 0.25 mcg by mouth daily. 08/03/21  Yes [provider]  carvedilol (COREG) 12.5 MG tablet Take 12.5 mg by mouth 2 (two) times daily with a meal.   Yes [provider]  FEROSUL 325 (65 Fe) MG tablet Take 325 mg by mouth 3 (three) times daily. 04/30/21  Yes [provider]  hydrALAZINE (APRESOLINE) 100 MG  tablet Take 100 mg by mouth 3 (three) times daily. 08/05/21  Yes [provider]  insulin detemir (LEVEMIR) 100 UNIT/ML injection Inject 0.08 mLs (8 Units total) into the skin daily. Patient taking differently: Inject 10 Units into the skin 2 (two) times daily. 10/31/19  Yes Christophe Louis, MD  insulin lispro (HUMALOG) 100 UNIT/ML injection Inject into the skin. 03/12/21  Yes [provider]  isosorbide mononitrate (IMDUR) 30 MG 24 hr tablet Take 30 mg by mouth daily.   Yes [provider]  losartan (COZAAR) 50 MG tablet Take 50 mg by mouth daily. 08/10/21  Yes [provider]  magnesium oxide (MAG-OX) 400 MG tablet Take by mouth. 08/03/21  Yes [provider]  methocarbamol (ROBAXIN) 500 MG tablet Take 500 mg by mouth 4 (four) times daily. 03/09/21  Yes [provider]  ondansetron (ZOFRAN) 4 MG tablet Take 4 mg by mouth every 8 (eight) hours as needed. 08/10/21  Yes [provider]  torsemide (DEMADEX) 20 MG tablet Take 20-40 mg by mouth See admin instructions. Takes 2 tablets in the morning and 1 tablet at lunch   Yes [provider]  Vitamin D, Ergocalciferol, (DRISDOL) 1.25 MG (50000 UNIT) CAPS capsule Take 50,000 Units by mouth every 7 (seven) days.   Yes [provider]  acetaminophen (TYLENOL) 500 MG tablet Take 1,000 mg by mouth every 6 (six) hours as needed for moderate pain. Patient not taking: Reported on 08/11/2021    [provider]  albuterol (VENTOLIN HFA) 108 (90 Base) MCG/ACT inhaler Inhale 2 puffs into the lungs every 6 (six) hours as needed for wheezing or shortness of breath.  01/15/20 02/16/21  [provider]  benzonatate (TESSALON) 100 MG capsule Take 100 mg by mouth 3 (three) times daily. Patient not taking: Reported on 03/29/2021    [provider]  hydrALAZINE (APRESOLINE) 25 MG tablet Take 75 mg by mouth 3 (three) times daily. Patient not taking: Reported on 08/11/2021     [provider]  HYDROcodone-acetaminophen (NORCO) 5-325 MG tablet Take 1-2 tablets by mouth every 6 (six) hours as needed. Patient not taking: Reported on 08/11/2021 05/13/21   Veryl Speak, MD  insulin aspart (NOVOLOG) 100 UNIT/ML injection Inject 2 Units into the skin 3 (three) times daily with meals. Patient not taking: Reported on 08/11/2021 10/30/19   Christophe Louis, MD  Insulin Syringe-Needle U-100 (INSULIN SYRINGE .3CC/31GX5/16") 31G X 5/16" 0.3 ML MISC Use as directed 10/27/19   [name], MD  losartan (COZAAR) 25 MG tablet Take 25 mg by mouth daily. Patient not taking: Reported on 08/11/2021    [provider]  medroxyPROGESTERone (DEPO-PROVERA) 150 MG/ML injection Inject 1 mL (150 mg total) into the muscle every 3 (three) months. Patient not taking: Reported on 08/11/2021 03/29/21   Gavin Pound, CNM  oxyCODONE (ROXICODONE) 5 MG immediate release tablet Take 1 tablet (5 mg  total) by mouth every 6 (six) hours as needed for up to 15 doses for severe pain. Patient not taking: Reported on 08/11/2021 05/07/21   Lennice Sites, DO  potassium chloride (KLOR-CON) 10 MEQ tablet Take 1 tablet (10 mEq total) by mouth daily. Patient not taking: Reported on 08/11/2021 10/27/19   [name], MD  escitalopram (LEXAPRO) 10 MG tablet Take 20 mg by mouth daily.  05/10/20 10/06/20  [provider]  furosemide (LASIX) 40 MG tablet Take 1 tablet (40 mg total) by mouth daily. Patient not taking: No sig reported 10/28/19 02/17/21  [name], MD  lisinopril (ZESTRIL) 10 MG tablet Take 10 mg by mouth daily.  02/23/20 10/06/20  [provider]  spironolactone (ALDACTONE) 25 MG tablet Take 25 mg by mouth daily.  04/20/20 10/06/20  [provider]    Physical Exam: Vitals:   08/11/21 1300 08/11/21 1630 08/11/21 1730 08/11/21 1850  BP: (!) 137/111 (!) 133/102  (!) 140/106  Pulse: 89 83  83  Resp: (!) 21 18  18   Temp:  98.7 F (37.1 C) 98.7 F (37.1 C) 98.2 F (36.8  C)  TempSrc:    Oral  SpO2: 98% 98%  100%  Weight:    66.8 kg  Height:    5' (1.524 m)    Constitutional: NAD, calm  Eyes: PERTLA, lids and conjunctivae normal ENMT: Mucous membranes are moist. Posterior pharynx clear of any exudate or lesions.   Neck: supple, no masses  Respiratory: no wheezing, no crackles. No accessory muscle use.  Cardiovascular: S1 & S2 heard, regular rate and rhythm. Mild b/l pedal edema. Abdomen: No distension, no tenderness, soft. Bowel sounds active.  Musculoskeletal: no clubbing / cyanosis. No joint deformity upper and lower extremities.   Skin: no significant rashes, lesions, ulcers. Warm, dry, well-perfused. Neurologic: CN 2-12 grossly intact. Moving all extremities. Alert and oriented.  Psychiatric: Pleasant. Cooperative.    Labs and Imaging on Admission: I have personally reviewed following labs and imaging studies  CBC: Recent Labs  Lab 08/10/21 0234 08/10/21 0335 08/11/21 1143  WBC 2.8*  --  3.2*  NEUTROABS 2.3  --  1.9  HGB 7.2* 7.5* 7.7*  HCT 23.2* 22.0* 23.7*  MCV 100.9*  --  96.7  PLT 208  --  292   Basic Metabolic Panel: Recent Labs  Lab 08/10/21 0234 08/10/21 0335 08/11/21 1143  NA 134* 135 132*  K 6.1* 6.1* 5.7*  CL 103  --  101  CO2 22  --  21*  GLUCOSE 296*  --  299*  BUN 59*  --  70*  CREATININE 6.47*  --  6.55*  CALCIUM 7.5*  --  7.3*   GFR: Estimated Creatinine Clearance: 11.2 mL/min (A) (by C-G formula based on SCr of 6.55 mg/dL (H)). Liver Function Tests: Recent Labs  Lab 08/10/21 0234 08/11/21 1143  AST 45* 40  ALT 31 35  ALKPHOS 91 97  BILITOT 0.6 0.3  PROT 5.0* 4.8*  ALBUMIN 2.1* 2.1*   Recent Labs  Lab 08/11/21 1143  LIPASE 23   No results for input(s): AMMONIA in the last 168 hours. Coagulation Profile: No results for input(s): INR, PROTIME in the last 168 hours. Cardiac Enzymes: No results for input(s): CKTOTAL, CKMB, CKMBINDEX, TROPONINI in the last 168 hours. BNP (last 3 results) No  results for input(s): PROBNP in the last 8760 hours. HbA1C: No results for input(s): HGBA1C in the last 72 hours. CBG: Recent Labs  Lab 08/10/21 0201 08/11/21 1108  08/11/21 1852  GLUCAP 262* 276* 230*   Lipid Profile: No results for input(s): CHOL, HDL, LDLCALC, TRIG, CHOLHDL, LDLDIRECT in the last 72 hours. Thyroid Function Tests: No results for input(s): TSH, T4TOTAL, FREET4, T3FREE, THYROIDAB in the last 72 hours. Anemia Panel: No results for input(s): VITAMINB12, FOLATE, FERRITIN, TIBC, IRON, RETICCTPCT in the last 72 hours. Urine analysis:    Component Value Date/Time   COLORURINE YELLOW 08/11/2021 1421   APPEARANCEUR CLEAR 08/11/2021 1421   LABSPEC 1.020 08/11/2021 1421   PHURINE 6.0 08/11/2021 1421   GLUCOSEU >=500 (A) 08/11/2021 1421   HGBUR SMALL (A) 08/11/2021 1421   BILIRUBINUR NEGATIVE 08/11/2021 1421   KETONESUR NEGATIVE 08/11/2021 1421   PROTEINUR >300 (A) 08/11/2021 1421   UROBILINOGEN 0.2 12/15/2015 1736   NITRITE NEGATIVE 08/11/2021 1421   LEUKOCYTESUR NEGATIVE 08/11/2021 1421   Sepsis Labs: @LABRCNTIP (procalcitonin:4,lacticidven:4) ) Recent Results (from the past 240 hour(s))  Resp Panel by RT-PCR (Flu A&B, Covid) Nasopharyngeal Swab     Status: Abnormal   Collection Time: 08/11/21 11:50 AM   Specimen: Nasopharyngeal Swab; Nasopharyngeal(NP) swabs in vial transport medium  Result Value Ref Range Status   SARS Coronavirus 2 by RT PCR NEGATIVE NEGATIVE Final    Comment: (NOTE) SARS-CoV-2 target nucleic acids are NOT DETECTED.  The SARS-CoV-2 RNA is generally detectable in upper respiratory specimens during the acute phase of infection. The lowest concentration of SARS-CoV-2 viral copies this assay can detect is 138 copies/mL. A negative result does not preclude SARS-Cov-2 infection and should not be used as the sole basis for treatment or other patient management decisions. A negative result may occur with  improper specimen collection/handling,  submission of specimen other than nasopharyngeal swab, presence of viral mutation(s) within the areas targeted by this assay, and inadequate number of viral copies(<138 copies/mL). A negative result must be combined with clinical observations, patient history, and epidemiological information. The expected result is Negative.  Fact Sheet for Patients:  EntrepreneurPulse.com.au  Fact Sheet for Healthcare Providers:  IncredibleEmployment.be  This test is no t yet approved or cleared by the [place] FDA and  has been authorized for detection and/or diagnosis of SARS-CoV-2 by FDA under an Emergency Use Authorization (EUA). This EUA will remain  in effect (meaning this test can be used) for the duration of the COVID-19 declaration under Section 564(b)(1) of the Act, 21 U.S.C.section 360bbb-3(b)(1), unless the authorization is terminated  or revoked sooner.       Influenza A by PCR POSITIVE (A) NEGATIVE Final   Influenza B by PCR NEGATIVE NEGATIVE Final    Comment: (NOTE) The Xpert Xpress SARS-CoV-2/FLU/RSV plus assay is intended as an aid in the diagnosis of influenza from Nasopharyngeal swab specimens and should not be used as a sole basis for treatment. Nasal washings and aspirates are unacceptable for Xpert Xpress SARS-CoV-2/FLU/RSV testing.  Fact Sheet for Patients: EntrepreneurPulse.com.au  Fact Sheet for Healthcare Providers: IncredibleEmployment.be  This test is not yet approved or cleared by the [place] FDA and has been authorized for detection and/or diagnosis of SARS-CoV-2 by FDA under an Emergency Use Authorization (EUA). This EUA will remain in effect (meaning this test can be used) for the duration of the COVID-19 declaration under Section 564(b)(1) of the Act, 21 U.S.C. section 360bbb-3(b)(1), unless the authorization is terminated or revoked.  Performed at [facility],  [address] [place], [place] [address]      Radiological Exams on Admission: DG Chest 2 View  Result Date: 08/11/2021  CLINICAL DATA:  25 year old female with history of shortness of breath and vomiting for the past 5 days. EXAM: CHEST - 2 VIEW COMPARISON:  Chest x-ray 05/12/2021. FINDINGS: There is cephalization of the pulmonary vasculature and slight indistinctness of the interstitial markings suggestive of mild pulmonary edema. Trace bilateral pleural effusions. Mild cardiomegaly. Upper mediastinal contours are within normal limits. IMPRESSION: 1. Findings are concerning for mild congestive heart failure, as above. Electronically Signed   By: Vinnie Langton M.D.   On: 08/11/2021 12:16    EKG: Independently reviewed. Sinus rhythm, QTc 635 ms.   Assessment/Plan   1. Acute on chronic systolic CHF  - BNP elevated and mild pulm edema on CXR in ED  - EF 25-30% in Sept 2022, attributed to postpartum cardiomyopathy  - She was given IV Lasix in ED  - Continue diuresis with 40 mg IV Lasix q12h, monitor wt and I/O, continue Coreg, hold losartan initially in light of hyperkalemia    2. CKD V on PD; hyperkalemia; hypervolemia  - Last did PD the night of 12/13 and has potassium 5.7 and hypervolemia in ED  - She was given Milly Jakob in ED  - Appreciate nephrology consultation    3. Nausea and vomiting  - She is tolerated liquid diet well after a dose of Ativan in ED, abdominal exam benign, will advance diet     4. Influenza A  - Flu A pcr positive in ED  - Treated with Tamiflu 75 mg in ED (this completes treatment for CKD patient on PD)  - Continue droplet precautions, supportive care    5. Insulin-dependent DM  - A1c was 10.6% in December 2022  - Continue CBG checks and insulin   6. Prolonged QT interval  - QTc is 635 ms on admission  - Check magnesium level, continue cardiac monitoring, avoid QT-prolonging medications    7. Chest pain - Reported chest pain in ED, denies any at  time of admission  - Troponin normal x2 and no acute ischemic features on EKG    8. Anemia  - Appears stable, no bleeding    DVT prophylaxis: sq heparin  Code Status: Full  Level of Care: Level of care: Telemetry Medical Family Communication: none present   Disposition Plan:  Patient is from: home  Anticipated d/c is to: home  Anticipated d/c date is: 12/17 or 08/14/21  Patient currently: Pending tolerance of adequate oral intake, treatment of hyperkalemia, likely needs inpatient dialysis  Consults called: Nephrology   Admission status: Inpatient     Vianne Bulls, MD [facility]  08/11/2021, 7:41 PM

## 2021-08-11 NOTE — ED Provider Notes (Signed)
[facility] EMERGENCY DEPARTMENT Provider Note   CSN: [id number] Arrival date & time: 08/11/21  1059     History Chief Complaint  Patient presents with   Vomiting    Kathy Lewis is a 25 y.o. female.  With past medical history of chronic kidney disease on peritoneal dialysis, type 1 diabetes, congestive heart failure with last EF 09/2019 of 60 to 65%, prolonged QT who presents to the emergency department with nausea and vomiting.  She states that for the past 2 days she has had ongoing nausea and vomiting with no p.o. intake.  She states that she has vomited in total about 6 times.  She states that she vomits only when she is eating.  She denies bloody emesis.  She also endorses nonbloody diarrhea over the past 2 days.  She endorses abdominal cramping prior to vomiting or diarrhea, otherwise not having abdominal pain.  Additionally she states that she had a cough that began yesterday and scratchy throat.  She also endorses burning, central chest pain that started yesterday that she states is in the center of her chest and in her throat.  Endorses shortness of breath only after throwing up.  She denies palpitations, syncope, lightheadedness or dizziness.  Denies fevers, congestion.  She states that last night she did not perform peritoneal dialysis due to how she was feeling.  She does also report 2 to 3 pound weight gain over the past 2 days.  HPI     Past Medical History:  Diagnosis Date   Anxiety    Bipolar 2 disorder ([place])    CHF (congestive heart failure) ([place])    CKD (chronic kidney disease), stage III ([place])    Depression    DKA (diabetic ketoacidoses)    HSV infection    on valtrex   Hypokalemia    Leukocytosis    Migraine    Noncompliance with medication regimen    Preeclampsia    Severe anemia    Type 1 diabetes mellitus ([place])     Patient Active Problem List   Diagnosis Date Noted   Bipolar 2 disorder ([place])    HTN (hypertension)    Acute renal failure  superimposed on stage 4 chronic kidney disease ([place]) 40/98/1191   Chronic systolic CHF (congestive heart failure) ([place]) 07/11/2020   CKD (chronic kidney disease), stage III ([facility])    Hypoalbuminemia    Anxiety    Normocytic anemia    Borderline prolonged QT interval    CHF exacerbation ([place]) 07/10/2020   Fever 10/28/2019   Preeclampsia 10/15/2019   Severe anemia 10/13/2019   Pre-existing type 1 diabetes mellitus with hyperglycemia during pregnancy in first trimester ([place]) 04/30/2019   Hypokalemia    DKA (diabetic ketoacidoses) 12/18/2014   Leukocytosis 12/18/2014   Noncompliance with medications 12/18/2014   Type 1 diabetes mellitus with ketoacidosis, uncontrolled ([place]) 12/18/2014   Nausea and vomiting 12/18/2014    Past Surgical History:  Procedure Laterality Date   CARDIAC CATHETERIZATION     DILATION AND EVACUATION N/A 10/22/2019   Procedure: ULTRASOUND GUIDED DILATATION AND EVACUATION;  Surgeon: [name], MD;  Location: [facility] LD [facility];  Service: Gynecology;  Laterality: N/A;   NO PAST SURGERIES     peritoneal dialysis catheter insertion     RENAL BIOPSY       OB History     Gravida  2   Para  1   Term      Preterm  1   AB  1  Living  1      SAB  1   IAB      Ectopic      Multiple  0   Live Births  1           Family History  Adopted: Yes  Family history unknown: Yes    Social History   Tobacco Use   Smoking status: Former    Packs/day: 1.00    Types: Cigars, Cigarettes   Smokeless tobacco: Never  Vaping Use   Vaping Use: Never used  Substance Use Topics   Alcohol use: No   Drug use: No    Home Medications Prior to Admission medications   Medication Sig Start Date End Date Taking? Authorizing Provider  acetaminophen (TYLENOL) 500 MG tablet Take 1,000 mg by mouth every 6 (six) hours as needed for moderate pain.    [provider]  albuterol (VENTOLIN HFA) 108 (90 Base) MCG/ACT inhaler Inhale 2 puffs into the lungs every  6 (six) hours as needed for wheezing or shortness of breath.  01/15/20 02/16/21  [provider]  benzonatate (TESSALON) 100 MG capsule Take 100 mg by mouth 3 (three) times daily. Patient not taking: Reported on 03/29/2021    [provider]  butalbital-acetaminophen-caffeine [facility]) [phone] MG tablet Take 1 tablet by mouth daily as needed for headache. 02/18/21 02/18/22  Dwyane Dee, MD  carvedilol (COREG) 12.5 MG tablet Take 12.5 mg by mouth 2 (two) times daily with a meal.    [provider]  hydrALAZINE (APRESOLINE) 25 MG tablet Take 75 mg by mouth 3 (three) times daily.    [provider]  HYDROcodone-acetaminophen (NORCO) 5-325 MG tablet Take 1-2 tablets by mouth every 6 (six) hours as needed. 05/13/21   Veryl Speak, MD  insulin aspart (NOVOLOG) 100 UNIT/ML injection Inject 2 Units into the skin 3 (three) times daily with meals. Patient taking differently: Inject 2-8 Units into the skin 3 (three) times daily with meals. Per sliding 10/30/19   Christophe Louis, MD  insulin detemir (LEVEMIR) 100 UNIT/ML injection Inject 0.08 mLs (8 Units total) into the skin daily. Patient taking differently: Inject 10 Units into the skin 2 (two) times daily. 10/31/19   Christophe Louis, MD  Insulin Syringe-Needle U-100 (INSULIN SYRINGE .3CC/31GX5/16") 31G X 5/16" 0.3 ML MISC Use as directed 10/27/19   [name], MD  isosorbide mononitrate (IMDUR) 30 MG 24 hr tablet Take 30 mg by mouth daily.    [provider]  losartan (COZAAR) 25 MG tablet Take 25 mg by mouth daily.    [provider]  medroxyPROGESTERone (DEPO-PROVERA) 150 MG/ML injection Inject 1 mL (150 mg total) into the muscle every 3 (three) months. 03/29/21   Gavin Pound, CNM  oxyCODONE (ROXICODONE) 5 MG immediate release tablet Take 1 tablet (5 mg total) by mouth every 6 (six) hours as needed for up to 15 doses for severe pain. 05/07/21   Curatolo, Adam, DO  potassium chloride (KLOR-CON) 10 MEQ tablet Take 1  tablet (10 mEq total) by mouth daily. 10/27/19   [name], MD  torsemide (DEMADEX) 20 MG tablet Take 20-40 mg by mouth See admin instructions. Takes 2 tablets in the morning and 1 tablet at lunch    [provider]  Vitamin D, Ergocalciferol, (DRISDOL) 1.25 MG (50000 UNIT) CAPS capsule Take 50,000 Units by mouth every 7 (seven) days.    [provider]  escitalopram (LEXAPRO) 10 MG tablet Take 20 mg by mouth daily.  05/10/20 10/06/20  [provider]  furosemide (LASIX) 40 MG tablet Take 1 tablet (40 mg total) by mouth daily. Patient not taking: No sig reported 10/28/19 02/17/21  [name], MD  lisinopril (ZESTRIL) 10 MG tablet Take 10 mg by mouth daily.  02/23/20 10/06/20  [provider]  spironolactone (ALDACTONE) 25 MG tablet Take 25 mg by mouth daily.  04/20/20 10/06/20  [provider]    Allergies    Cantaloupe extract allergy skin test, Strawberry extract, Citrullus vulgaris, and Nsaids  Review of Systems   Review of Systems  Constitutional:  Positive for appetite change and fatigue. Negative for chills and fever.  HENT:  Negative for congestion and sore throat.   Eyes:  Negative for pain and visual disturbance.  Respiratory:  Positive for cough and shortness of breath.   Cardiovascular:  Positive for chest pain and leg swelling. Negative for palpitations.  Gastrointestinal:  Positive for abdominal pain, diarrhea, nausea and vomiting. Negative for blood in stool.  Genitourinary:  Negative for dysuria, hematuria, vaginal bleeding and vaginal discharge.  Neurological:  Negative for dizziness, syncope and light-headedness.  All other systems reviewed and are negative.  Physical Exam Updated Vital Signs BP (!) 132/108 (BP Location: Right Arm)    Pulse 85    Temp 98.3 F (36.8 C) (Oral)    Resp 18    Ht 5' (1.524 m)    Wt 66.2 kg    LMP  (LMP Unknown)    SpO2 99%    BMI 28.51 kg/m   Physical Exam Vitals and nursing note reviewed.   Constitutional:      General: She is not in acute distress.    Appearance: Normal appearance. She is not toxic-appearing.  HENT:     Head: Normocephalic and atraumatic.     Nose: Nose normal.     Mouth/Throat:     Mouth: Mucous membranes are moist.     Pharynx: Oropharynx is clear.  Eyes:     General: No scleral icterus.    Extraocular Movements: Extraocular movements intact.     Pupils: Pupils are equal, round, and reactive to light.  Cardiovascular:     Rate and Rhythm: Normal rate and regular rhythm.     Pulses: Normal pulses.          Radial pulses are 2+ on the right side and 2+ on the left side.     Heart sounds: No murmur heard. Pulmonary:     Effort: Pulmonary effort is normal. No respiratory distress.     Breath sounds: Examination of the left-lower field reveals rales. Rales present.  Abdominal:     General: Abdomen is protuberant. Bowel sounds are normal. There is distension.     Palpations: Abdomen is soft.     Tenderness: There is no abdominal tenderness. There is no right CVA tenderness, left CVA tenderness or guarding.     Comments: Peritoneal dialysis catheter in the right middle abdomen.  Appears clean, dry and intact.  No surrounding cellulitis or discharge.  Her abdomen is somewhat swollen but soft   Musculoskeletal:     Cervical back: Normal range of motion.     Right lower leg: 1+ Pitting Edema present.     Left lower leg: 1+ Pitting Edema present.  Skin:    General: Skin is warm and dry.     Capillary Refill: Capillary refill takes less than 2 seconds.  Neurological:     General: No focal deficit present.     Mental Status:  She is alert and oriented to person, place, and time. Mental status is at baseline.  Psychiatric:        Mood and Affect: Mood normal.        Behavior: Behavior normal.        Thought Content: Thought content normal.        Judgment: Judgment normal.    ED Results / Procedures / Treatments   Labs (all labs ordered are listed,  but only abnormal results are displayed) Labs Reviewed  RESP PANEL BY RT-PCR (FLU A&B, COVID) ARPGX2 - Abnormal; Notable for the following components:      Result Value   Influenza A by PCR POSITIVE (*)    All other components within normal limits  COMPREHENSIVE METABOLIC PANEL - Abnormal; Notable for the following components:   Sodium 132 (*)    Potassium 5.7 (*)    CO2 21 (*)    Glucose, Bld 299 (*)    BUN 70 (*)    Creatinine, Ser 6.55 (*)    Calcium 7.3 (*)    Total Protein 4.8 (*)    Albumin 2.1 (*)    GFR, Estimated 8 (*)    All other components within normal limits  BRAIN NATRIURETIC PEPTIDE - Abnormal; Notable for the following components:   B Natriuretic Peptide 1,278.5 (*)    All other components within normal limits  CBC WITH DIFFERENTIAL/PLATELET - Abnormal; Notable for the following components:   WBC 3.2 (*)    RBC 2.45 (*)    Hemoglobin 7.7 (*)    HCT 23.7 (*)    All other components within normal limits  CBG MONITORING, ED - Abnormal; Notable for the following components:   Glucose-Capillary 276 (*)    All other components within normal limits  LIPASE, BLOOD  HCG, SERUM, QUALITATIVE  URINALYSIS, ROUTINE W REFLEX MICROSCOPIC  TROPONIN I (HIGH SENSITIVITY)  TROPONIN I (HIGH SENSITIVITY)   EKG EKG Interpretation  Date/Time:  Thursday August 11 2021 11:11:58 EST Ventricular Rate:  86 PR Interval:  143 QRS Duration: 89 QT Interval:  530 QTC Calculation: 635 R Axis:   44 Text Interpretation: Sinus rhythm Probable left atrial enlargement Low voltage, extremity leads Prolonged QT interval Confirmed by Orpah Greek [phone]) on 08/11/2021 11:42:47 AM  Radiology DG Chest 2 View  Result Date: 08/11/2021 CLINICAL DATA:  25 year old female with history of shortness of breath and vomiting for the past 5 days. EXAM: CHEST - 2 VIEW COMPARISON:  Chest x-ray 05/12/2021. FINDINGS: There is cephalization of the pulmonary vasculature and slight indistinctness  of the interstitial markings suggestive of mild pulmonary edema. Trace bilateral pleural effusions. Mild cardiomegaly. Upper mediastinal contours are within normal limits. IMPRESSION: 1. Findings are concerning for mild congestive heart failure, as above. Electronically Signed   By: Vinnie Langton M.D.   On: 08/11/2021 12:16    Procedures Procedures   Medications Ordered in ED Medications  furosemide (LASIX) injection 40 mg (has no administration in time range)  LORazepam (ATIVAN) injection 1 mg (1 mg Intravenous Given 08/11/21 1156)  sodium zirconium cyclosilicate (LOKELMA) packet 10 g (10 g Oral Given 08/11/21 1319)   ED Course  I have reviewed the triage vital signs and the nursing notes.  Pertinent labs & imaging results that were available during my care of the patient were reviewed by me and considered in my medical decision making (see chart for details).    MDM Rules/Calculators/A&P 25 year old female who presents to the emergency department with abdominal pain,  nausea and vomiting.  Also noted to have fluid volume overloaded on exam. Lab review: Influenza positive CBC with leukopenia to 3.2, hemoglobin 7.7 both appear chronic CMP with creatinine of 6.55, potassium of 5.7.  Temporized here in the emergency department with 40 of Lasix, Lokelma 10 g. EKG with prolonged QT of 638, otherwise no evidence of ischemia or infarction.  Initial troponin negative, doubt ACS as a component of her presentation. BNP 1278.  She has a history of congestive heart failure.  Only previous BNP was 1 year ago.  She does appear fluid volume overloaded on exam with crackles bilaterally, bilateral 1+ pitting edema.  Chest x-ray with bilateral pulmonary congestion. given Lasix. Nausea and vomiting likely viral related.  Given her prolonged QT will avoid Zofran, giving Ativan 1 mg.  1315: Spoke with Dr. Tamala Julian, hospitalist, who agrees for admission at this time. [place] [facility], Dr. Jonnie Finner, who is  aware of patient admission given that she is PD patient and did not have dialysis last night. Final Clinical Impression(s) / ED Diagnoses Final diagnoses:  Influenza A  Acute on chronic systolic congestive heart failure [facility])    Rx / DC Orders ED Discharge Orders     None        Mickie Hillier, PA-C 08/11/21 1334    Orpah Greek, MD 08/11/21 1351

## 2021-08-11 NOTE — ED Notes (Signed)
Patient transported to X-ray 

## 2021-08-11 NOTE — ED Notes (Signed)
Pt provided specimen cup but unable to urinate at this time.

## 2021-08-11 NOTE — Consult Note (Signed)
[facility] Renal Consultation Note  Requesting MD: Mitzi Hansen, MD Indication for Consultation:  ESRD  Chief complaint: weakness  HPI:  Kathy Lewis is a 25 y.o. female with a history including ESRD followed by Dr. Olivia Mackie [facility], bipolar disorder, depression, and type 1 DM who presented to the [place] with weakness as well as hyperglycemia and n/v.  She has had some shortness of breath when she coughs but otherwise breathing is ok.  Chest discomfort with coughing.  She was found to have flu A.  She was transferred to [facility] for further care and nephrology is consulted for assistance with dialysis.  She initially came here last night but wait was 12 hours so then she went to [facility].  She has been given lokelma as well as lasix IV for hyperglycemia.  She was given tamiflu as well.  As noted below I am concerned about the accuracy of her medication list below since duplicate therapies are listed.  She is also reported as being on potassium.  She states that she started dialysis about 3 months ago and she states that her blood sugars have been harder to control recently.  She does 4 cycles over 9 hours and usually uses 2.5% dextrose solution.  Has 1.5 liter fill volumes.  She has a dry day.  She states she does home training at [facility].  No last bag fill.  She tells me she does not think she has much fluid on.  She states fluid clear and no abdominal pain.   PMHx:   Past Medical History:  Diagnosis Date   Anxiety    Bipolar 2 disorder ([facility])    CHF (congestive heart failure) ([facility])    Depression    DKA (diabetic ketoacidoses)    HSV infection    on valtrex   Hypokalemia    Leukocytosis    Migraine    Noncompliance with medication regimen    Preeclampsia    Severe anemia    Type 1 diabetes mellitus ([place])     Past Surgical History:  Procedure Laterality Date   CARDIAC CATHETERIZATION     DILATION AND EVACUATION N/A 10/22/2019   Procedure:  ULTRASOUND GUIDED DILATATION AND EVACUATION;  Surgeon: [name], MD;  Location: [facility] LD ORS;  Service: Gynecology;  Laterality: N/A;   NO PAST SURGERIES     peritoneal dialysis catheter insertion     RENAL BIOPSY      Family Hx:  Family History  Adopted: Yes  Family history unknown: Yes    Social History:  reports that she has quit smoking. Her smoking use included cigars and cigarettes. She smoked an average of 1 pack per day. She has never used smokeless tobacco. She reports that she does not drink alcohol and does not use drugs.  Allergies:  Allergies  Allergen Reactions   Cantaloupe Extract Allergy Skin Test Itching    Mouth itching     Strawberry Extract     Other reaction(s): Itching Mouth itches Mouth itches   Citrullus Vulgaris Other (See Comments)    Makes mouth itch , ALL melons Makes mouth itch , ALL melons   Nsaids     Avoid per nephrology    Medications:  I am concerned about the accuracy of the list below which is imported as this both lisinopril and losartan are listed; lasix and torsemide are also both listed.  Prior to Admission medications   Medication Sig Start Date End Date Taking? Authorizing  Provider  atorvastatin (LIPITOR) 40 MG tablet Take 80 mg by mouth daily. 08/03/21  Yes [provider]  butalbital-acetaminophen-caffeine (FIORICET) 50-325-40 MG tablet Take 1 tablet by mouth daily as needed for headache. 02/18/21 02/18/22 Yes Dwyane Dee, MD  calcitRIOL (ROCALTROL) 0.25 MCG capsule Take 0.25 mcg by mouth daily. 08/03/21  Yes [provider]  carvedilol (COREG) 12.5 MG tablet Take 12.5 mg by mouth 2 (two) times daily with a meal.   Yes [provider]  FEROSUL 325 (65 Fe) MG tablet Take 325 mg by mouth 3 (three) times daily. 04/30/21  Yes [provider]  hydrALAZINE (APRESOLINE) 100 MG tablet Take 100 mg by mouth 3 (three) times daily. 08/05/21  Yes [provider]  insulin detemir (LEVEMIR) 100 UNIT/ML  injection Inject 0.08 mLs (8 Units total) into the skin daily. Patient taking differently: Inject 10 Units into the skin 2 (two) times daily. 10/31/19  Yes Christophe Louis, MD  insulin lispro (HUMALOG) 100 UNIT/ML injection Inject into the skin. 03/12/21  Yes [provider]  isosorbide mononitrate (IMDUR) 30 MG 24 hr tablet Take 30 mg by mouth daily.   Yes [provider]  losartan (COZAAR) 50 MG tablet Take 50 mg by mouth daily. 08/10/21  Yes [provider]  magnesium oxide (MAG-OX) 400 MG tablet Take by mouth. 08/03/21  Yes [provider]  methocarbamol (ROBAXIN) 500 MG tablet Take 500 mg by mouth 4 (four) times daily. 03/09/21  Yes [provider]  ondansetron (ZOFRAN) 4 MG tablet Take 4 mg by mouth every 8 (eight) hours as needed. 08/10/21  Yes [provider]  torsemide (DEMADEX) 20 MG tablet Take 20-40 mg by mouth See admin instructions. Takes 2 tablets in the morning and 1 tablet at lunch   Yes [provider]  Vitamin D, Ergocalciferol, (DRISDOL) 1.25 MG (50000 UNIT) CAPS capsule Take 50,000 Units by mouth every 7 (seven) days.   Yes [provider]  acetaminophen (TYLENOL) 500 MG tablet Take 1,000 mg by mouth every 6 (six) hours as needed for moderate pain. Patient not taking: Reported on 08/11/2021    [provider]  albuterol (VENTOLIN HFA) 108 (90 Base) MCG/ACT inhaler Inhale 2 puffs into the lungs every 6 (six) hours as needed for wheezing or shortness of breath.  01/15/20 02/16/21  [provider]  benzonatate (TESSALON) 100 MG capsule Take 100 mg by mouth 3 (three) times daily. Patient not taking: Reported on 03/29/2021    [provider]  hydrALAZINE (APRESOLINE) 25 MG tablet Take 75 mg by mouth 3 (three) times daily. Patient not taking: Reported on 08/11/2021    [provider]  HYDROcodone-acetaminophen (NORCO) 5-325 MG tablet Take 1-2 tablets by mouth every 6 (six) hours as  needed. Patient not taking: Reported on 08/11/2021 05/13/21   Veryl Speak, MD  insulin aspart (NOVOLOG) 100 UNIT/ML injection Inject 2 Units into the skin 3 (three) times daily with meals. Patient not taking: Reported on 08/11/2021 10/30/19   Christophe Louis, MD  Insulin Syringe-Needle U-100 (INSULIN SYRINGE .3CC/31GX5/16") 31G X 5/16" 0.3 ML MISC Use as directed 10/27/19   [name], MD  losartan (COZAAR) 25 MG tablet Take 25 mg by mouth daily. Patient not taking: Reported on 08/11/2021    [provider]  medroxyPROGESTERone (DEPO-PROVERA) 150 MG/ML injection Inject 1 mL (150 mg total) into the muscle every 3 (three) months. Patient not taking: Reported on 08/11/2021 03/29/21   Gavin Pound, CNM  oxyCODONE (ROXICODONE) 5 MG immediate  release tablet Take 1 tablet (5 mg total) by mouth every 6 (six) hours as needed for up to 15 doses for severe pain. Patient not taking: Reported on 08/11/2021 05/07/21   Lennice Sites, DO  potassium chloride (KLOR-CON) 10 MEQ tablet Take 1 tablet (10 mEq total) by mouth daily. Patient not taking: Reported on 08/11/2021 10/27/19   [name], MD  escitalopram (LEXAPRO) 10 MG tablet Take 20 mg by mouth daily.  05/10/20 10/06/20  [provider]  furosemide (LASIX) 40 MG tablet Take 1 tablet (40 mg total) by mouth daily. Patient not taking: No sig reported 10/28/19 02/17/21  [name], MD  lisinopril (ZESTRIL) 10 MG tablet Take 10 mg by mouth daily.  02/23/20 10/06/20  [provider]  spironolactone (ALDACTONE) 25 MG tablet Take 25 mg by mouth daily.  04/20/20 10/06/20  [provider]    I have reviewed the patient's current and her reported prior to admission medications.  Prior to admission medication list does not appear accurate  Labs:  BMP Latest Ref Rng & Units 08/11/2021 08/10/2021 08/10/2021  Glucose 70 - 99 mg/dL 299(H) - 296(H)  BUN 6 - 20 mg/dL 70(H) - 59(H)  Creatinine 0.44 - 1.00 mg/dL 6.55(H) - 6.47(H)  Sodium 135 -  145 mmol/L 132(L) 135 134(L)  Potassium 3.5 - 5.1 mmol/L 5.7(H) 6.1(H) 6.1(H)  Chloride 98 - 111 mmol/L 101 - 103  CO2 22 - 32 mmol/L 21(L) - 22  Calcium 8.9 - 10.3 mg/dL 7.3(L) - 7.5(L)    Urinalysis    Component Value Date/Time   COLORURINE YELLOW 08/11/2021 [place] 08/11/2021 1421   LABSPEC 1.020 08/11/2021 1421   PHURINE 6.0 08/11/2021 1421   GLUCOSEU >=500 (A) 08/11/2021 1421   HGBUR SMALL (A) 08/11/2021 1421   BILIRUBINUR NEGATIVE 08/11/2021 1421   KETONESUR NEGATIVE 08/11/2021 1421   PROTEINUR >300 (A) 08/11/2021 1421   UROBILINOGEN 0.2 12/15/2015 1736   NITRITE NEGATIVE 08/11/2021 1421   LEUKOCYTESUR NEGATIVE 08/11/2021 1421     ROS:  Pertinent items noted in HPI and remainder of comprehensive ROS otherwise negative.  Physical Exam: Vitals:   08/11/21 1850 08/11/21 1949  BP: (!) 140/106 (!) 142/107  Pulse: 83 79  Resp: 18 13  Temp: 98.2 F (36.8 C) 97.9 F (36.6 C)  SpO2: 100% 100%     General: adult female in bed in NAD HEENT: Normocephalic atraumatic Eyes: Extraocular movements intact.  Sclera anicteric Neck: Supple trachea midline Heart: Regular rate and rhythm Lungs: Unlabored on room air no audible wheezing Abdomen: Soft nontender nondistended Extremities: No edema no cyanosis or clubbing Skin: No rash on extremities exposed noted tattoo right leg Neuro: Alert and oriented x3 provides a history follows commands Psych normal mood and affect left Access [place] site clean  Assessment/Plan:  # ESRD  - Continue peritoneal dialysis tonight, cycler when staff is available Will do 4 cycles over 9 hours with a mix of 1.5 and 2.5% dextrose 10 - Would obtain additional information/orders tomorrow once her home training unit is open - pause lasix IV for now and reassess in am restarting her home regimen - would clarify if on torsemide or lasix at home - I have ordered PD fluid cell count and culture and have spoken with dialysis RN -  please follow  # Influenza A - Therapies per primary team  # Hyperkalemia - PD tonight and she has received Lokelma as well as Lasix - Appreciate renal diet  - would not  resume potassium supplement - Would hold RAAS blockade until hyperkalemia controlled  # HTN  - Status post Lasix and optimize volume with HD - Her medication list does not appear accurate  # Metabolic bone disease - She is on calcitriol.  Check phos in am  #Anemia CKD -Would obtain outpatient ESA administration history from her home training unit tomorrow  # Type 1 diabetes mellitus - uncontrolled - regimen per primary team   Claudia Desanctis 08/11/2021 9:16 PM

## 2021-08-11 NOTE — Progress Notes (Signed)
Patient arrive via [facility] and is admitted to room [phone].  [facility] notified via Text page.  Awaiting admission orders.

## 2021-08-11 NOTE — ED Notes (Addendum)
Report given to [facility]. 

## 2021-08-12 DIAGNOSIS — I5023 Acute on chronic systolic (congestive) heart failure: Secondary | ICD-10-CM | POA: Diagnosis not present

## 2021-08-12 DIAGNOSIS — N185 Chronic kidney disease, stage 5: Secondary | ICD-10-CM | POA: Diagnosis not present

## 2021-08-12 DIAGNOSIS — I10 Essential (primary) hypertension: Secondary | ICD-10-CM | POA: Diagnosis not present

## 2021-08-12 DIAGNOSIS — Z20822 Contact with and (suspected) exposure to covid-19: Secondary | ICD-10-CM | POA: Diagnosis not present

## 2021-08-12 DIAGNOSIS — I132 Hypertensive heart and chronic kidney disease with heart failure and with stage 5 chronic kidney disease, or end stage renal disease: Secondary | ICD-10-CM | POA: Diagnosis not present

## 2021-08-12 LAB — CBC
HCT: 23.5 % — ABNORMAL LOW (ref 36.0–46.0)
Hemoglobin: 7.8 g/dL — ABNORMAL LOW (ref 12.0–15.0)
MCH: 31.8 pg (ref 26.0–34.0)
MCHC: 33.2 g/dL (ref 30.0–36.0)
MCV: 95.9 fL (ref 80.0–100.0)
Platelets: 213 10*3/uL (ref 150–400)
RBC: 2.45 MIL/uL — ABNORMAL LOW (ref 3.87–5.11)
RDW: 13 % (ref 11.5–15.5)
WBC: 2.3 10*3/uL — ABNORMAL LOW (ref 4.0–10.5)
nRBC: 0 % (ref 0.0–0.2)

## 2021-08-12 LAB — BASIC METABOLIC PANEL
Anion gap: 10 (ref 5–15)
BUN: 65 mg/dL — ABNORMAL HIGH (ref 6–20)
CO2: 23 mmol/L (ref 22–32)
Calcium: 7.4 mg/dL — ABNORMAL LOW (ref 8.9–10.3)
Chloride: 102 mmol/L (ref 98–111)
Creatinine, Ser: 6.95 mg/dL — ABNORMAL HIGH (ref 0.44–1.00)
GFR, Estimated: 8 mL/min — ABNORMAL LOW (ref >60)
Glucose, Bld: 171 mg/dL — ABNORMAL HIGH (ref 70–99)
Potassium: 4.5 mmol/L (ref 3.5–5.1)
Sodium: 135 mmol/L (ref 135–145)

## 2021-08-12 LAB — GLUCOSE, CAPILLARY
Glucose-Capillary: 113 mg/dL — ABNORMAL HIGH (ref 70–99)
Glucose-Capillary: 163 mg/dL — ABNORMAL HIGH (ref 70–99)
Glucose-Capillary: 165 mg/dL — ABNORMAL HIGH (ref 70–99)
Glucose-Capillary: 266 mg/dL — ABNORMAL HIGH (ref 70–99)

## 2021-08-12 LAB — PHOSPHORUS: Phosphorus: 6 mg/dL — ABNORMAL HIGH (ref 2.5–4.6)

## 2021-08-12 LAB — BODY FLUID CELL COUNT WITH DIFFERENTIAL: Total Nucleated Cell Count, Fluid: 3 cu mm (ref 0–1000)

## 2021-08-12 LAB — HIV ANTIBODY (ROUTINE TESTING W REFLEX): HIV Screen 4th Generation wRfx: NONREACTIVE

## 2021-08-12 MED ORDER — INSULIN LISPRO 100 UNIT/ML IJ SOLN: 2.0000 [IU] | Freq: Three times a day (TID) | INTRAMUSCULAR | 11 refills | Status: DC

## 2021-08-12 MED ORDER — GENTAMICIN SULFATE 0.1 % EX CREA: 1.0000 "application " | TOPICAL_CREAM | Freq: Every day | CUTANEOUS | 3 refills | Status: DC

## 2021-08-12 MED ORDER — PROCHLORPERAZINE MALEATE 5 MG PO TABS: 5.0000 mg | ORAL_TABLET | Freq: Four times a day (QID) | ORAL | 0 refills | Status: DC | PRN

## 2021-08-12 MED ORDER — INSULIN DETEMIR 100 UNIT/ML ~~LOC~~ SOLN: 10.0000 [IU] | Freq: Two times a day (BID) | SUBCUTANEOUS | Status: DC

## 2021-08-12 NOTE — Progress Notes (Signed)
Heart Failure Navigator Progress Note  Assessed for Heart & Vascular TOC clinic readiness.  Patient does not meet criteria due to ESRD on HD x 3 months. SCr 6.95 today.   Navigator available for educational resources.   Pricilla Holm, MSN, RN Heart Failure Nurse Navigator [phone]

## 2021-08-12 NOTE — TOC Initial Note (Signed)
Transition of Care [facility]) - Initial/Assessment Note    Patient Details  Name: Kathy Lewis MRN: [id number] Date of Birth: [birth year]  Transition of Care [facility]) CM/SW Contact:    Bethena Roys, RN Phone Number: 08/12/2021, 1:13 PM  Clinical Narrative:  Risk for readmission assessment completed. Prior to arrival patient was from home with family support. Patients parents are at the bedside and they will transport patient home and obtain her car from [place]. Patient states she gets to primary care providers appointments without any issues. No home needs identified during the conversation. Plan for patient to transition home today.   Expected Discharge Plan: Home/Self Care Barriers to Discharge: No Barriers Identified   Patient Goals and CMS Choice Patient states their goals for this hospitalization and ongoing recovery are:: to return home with family support.   Choice offered to / list presented to : NA  Expected Discharge Plan and Services Expected Discharge Plan: Home/Self Care In-house Referral: NA Discharge Planning Services: CM Consult Post Acute Care Choice: NA Living arrangements for the past 2 months: Apartment Expected Discharge Date: 08/12/21               DME Arranged: N/A DME Agency: NA       HH Arranged: NA          Prior Living Arrangements/Services Living arrangements for the past 2 months: Apartment Lives with:: Self Patient language and need for interpreter reviewed:: Yes Do you feel safe going back to the place where you live?: Yes      Need for Family Participation in Patient Care: Yes (Comment) Care giver support system in place?: Yes (comment)   Criminal Activity/Legal Involvement Pertinent to Current Situation/Hospitalization: No - Comment as needed  Activities of Daily Living Home Assistive Devices/Equipment: None ADL Screening (condition at time of admission) Patient's cognitive ability adequate to safely complete  daily activities?: Yes Is the patient deaf or have difficulty hearing?: No Does the patient have difficulty seeing, even when wearing glasses/contacts?: No Does the patient have difficulty concentrating, remembering, or making decisions?: No Patient able to express need for assistance with ADLs?: Yes Does the patient have difficulty dressing or bathing?: No Independently performs ADLs?: Yes (appropriate for developmental age) Does the patient have difficulty walking or climbing stairs?: No Weakness of Legs: None Weakness of Arms/Hands: None  Permission Sought/Granted Permission sought to share information with : Family Supports                Emotional Assessment Appearance:: Appears stated age Attitude/Demeanor/Rapport: Engaged Affect (typically observed): Appropriate Orientation: : Oriented to  Time, Oriented to Situation, Oriented to Place, Oriented to Self      Admission diagnosis:  Influenza A [J10.1] Acute exacerbation of CHF (congestive heart failure) ([place]) [I50.9] Acute on chronic systolic congestive heart failure ([place]) [I50.23] Patient Active Problem List   Diagnosis Date Noted   Acute on chronic systolic CHF (congestive heart failure) ([place]) 08/11/2021   Influenza A 08/11/2021   Hyperkalemia 08/11/2021   Bipolar 2 disorder ([place])    HTN (hypertension)    Acute renal failure superimposed on stage 4 chronic kidney disease ([place]) 54/27/0623   Chronic systolic CHF (congestive heart failure) ([place]) 07/11/2020   CKD (chronic kidney disease), stage V (HCC)    Hypoalbuminemia    Anxiety    Normocytic anemia    Prolonged QT interval    CHF exacerbation ([place]) 07/10/2020   Fever 10/28/2019   Preeclampsia 10/15/2019   Severe anemia 10/13/2019  Pre-existing type 1 diabetes mellitus with hyperglycemia during pregnancy in first trimester ([place]) 04/30/2019   Hypokalemia    DKA (diabetic ketoacidoses) 12/18/2014   Leukocytosis 12/18/2014   Noncompliance with medications  12/18/2014   Type 1 diabetes mellitus with ketoacidosis, uncontrolled ([place]) 12/18/2014   Nausea and vomiting 12/18/2014   PCP:  [facility], [place]:   [place] [place] [address] Phone: [phone] Fax: [phone]   Readmission Risk Interventions Readmission Risk Prevention Plan 08/12/2021  Transportation Screening Complete  Medication Review (RN Care Manager) Complete  PCP or Specialist appointment within 3-5 days of discharge Complete  HRI or [place] Complete  SW Recovery Care/Counseling Consult Complete  [place] Not Applicable  Some recent data might be hidden

## 2021-08-12 NOTE — Progress Notes (Signed)
Inpatient Diabetes Program Recommendations  AACE/ADA: New Consensus Statement on Inpatient Glycemic Control (2015)  Target Ranges:  Prepandial:   less than 140 mg/dL      Peak postprandial:   less than 180 mg/dL (1-2 hours)      Critically ill patients:  140 - 180 mg/dL   Lab Results  Component Value Date   GLUCAP 163 (H) 08/12/2021   HGBA1C 8.6 (H) 02/16/2021    Review of Glycemic Control  Latest Reference Range & Units 08/12/21 04:04 08/12/21 07:39 08/12/21 11:02  Glucose-Capillary 70 - 99 mg/dL 165 (H) 113 (H) 163 (H)  (H): Data is abnormally high Diabetes history: Type 1 dM Outpatient Diabetes medications: Humalog  2 units TID, Levemir 10 units BID Current orders for Inpatient glycemic control: Levemir 10 units BID, Novolog 0-6 units Q4H  Inpatient Diabetes Program Recommendations:    Spoke with patient and her father regarding outpatient diabetes management. Since starting PD patient states, "I have had a lot on my plate and have kinda of lost track".  Reviewed patient's current A1c of 10.6%. Explained what a A1c is and what it measures. Also reviewed goal A1c with patient, importance of good glucose control @ home, and blood sugar goals. Reviewed patho of DM, need for improved control, risk of infection especially with PD, vascular changes and commorbidities.  Patient has not been checking CBGs as frequently. Is not currently wearing CGM,however is interested. Education on risks and benefits, application, and cost. Secure chat sent to MD for order.  Encouraged patient to get an appointment scheduled for outpatient endocrinology. No further questions at this time.   Thanks, Bronson Curb, MSN, RNC-OB Diabetes Coordinator [phone] (8a-5p)

## 2021-08-12 NOTE — Progress Notes (Signed)
Mobility Specialist Progress Note    08/12/21 1257  Mobility  Activity Ambulated in hall  Level of Assistance Independent after set-up  Assistive Device None  Distance Ambulated (ft) 400 ft  Mobility Ambulated independently in hallway  Mobility Response Tolerated well  Mobility performed by Mobility specialist  $Mobility charge 1 Mobility   Pt received sitting EOB and agreeable. No complaints on walk. Returned to sitting EOB with family present.   [facility] Mobility Specialist  M.S. Primary Phone: [phone] M.S. Secondary Phone: [phone]

## 2021-08-12 NOTE — Progress Notes (Signed)
[facility] ROUNDING NOTE   Subjective:   Interval History: 25 year old lady end-stage renal disease followed by Dr. Olivia Mackie at [facility].  History of diabetes bipolar disorder depression.  She was admitted 08/11/2021 with hyperglycemia nausea vomiting and chest pain.  She has some congestive heart failure ejection fraction 20 to 25% and was found to have hyperkalemia and some mild volume overload.  She was treated with peritoneal dialysis.  She also was positive for influenza A and is now being treated with 75 mg of Tamiflu.  Blood pressure 117/88 pulse 80 temperature 98.3 O2 sats 100% room air  Sodium 135 potassium 4.5 chloride 102 CO2 23 BUN 65 creatinine 6.95 glucose 171 phosphorus 6 calcium 7.4 hemoglobin 7.8  Lipitor 80 mg daily, calcitriol 0.25 mcg daily, Coreg 12.5 mg twice daily hydralazine 100 mg 3 times daily, insulin, isosorbide 30 mg daily.  Objective:  Vital signs in last 24 hours:  Temp:  [97.9 F (36.6 C)-99.7 F (37.6 C)] 98.3 F (36.8 C) (12/16 0824) Pulse Rate:  [79-95] 84 (12/16 0824) Resp:  [12-29] 13 (12/16 0824) BP: (105-142)/(72-111) 115/83 (12/16 0824) SpO2:  [98 %-100 %] 100 % (12/15 1949) Weight:  [66.4 kg-66.9 kg] 66.4 kg (12/16 0824)  Weight change:  Filed Weights   08/11/21 2128 08/12/21 0409 08/12/21 0824  Weight: 66.7 kg 66.9 kg 66.4 kg    Intake/Output: I/O last 3 completed shifts: In: 480 [P.O.:480] Out: 100 [Urine:100]   Intake/Output this shift:  Total I/O In: -  Out: 1375 [Other:1375]   General: adult female in bed in NAD Neck: Supple trachea midline Heart: Regular rate and rhythm Lungs: Unlabored on room air no audible wheezing Abdomen: Soft nontender nondistended Extremities: No edema no cyanosis or clubbing  Access Tenckhoff site clean   Basic Metabolic Panel: Recent Labs  Lab 08/10/21 0234 08/10/21 0335 08/11/21 1143 08/11/21 2126 08/12/21 0346  NA 134* 135 132*  --  135  K 6.1* 6.1* 5.7*  --  4.5   CL 103  --  101  --  102  CO2 22  --  21*  --  23  GLUCOSE 296*  --  299*  --  171*  BUN 59*  --  70*  --  65*  CREATININE 6.47*  --  6.55*  --  6.95*  CALCIUM 7.5*  --  7.3*  --  7.4*  MG  --   --   --  2.1  --   PHOS  --   --   --   --  6.0*    Liver Function Tests: Recent Labs  Lab 08/10/21 0234 08/11/21 1143  AST 45* 40  ALT 31 35  ALKPHOS 91 97  BILITOT 0.6 0.3  PROT 5.0* 4.8*  ALBUMIN 2.1* 2.1*   Recent Labs  Lab 08/11/21 1143  LIPASE 23   No results for input(s): AMMONIA in the last 168 hours.  CBC: Recent Labs  Lab 08/10/21 0234 08/10/21 0335 08/11/21 1143 08/12/21 0346  WBC 2.8*  --  3.2* 2.3*  NEUTROABS 2.3  --  1.9  --   HGB 7.2* 7.5* 7.7* 7.8*  HCT 23.2* 22.0* 23.7* 23.5*  MCV 100.9*  --  96.7 95.9  PLT 208  --  231 213    Cardiac Enzymes: No results for input(s): CKTOTAL, CKMB, CKMBINDEX, TROPONINI in the last 168 hours.  BNP: Invalid input(s): POCBNP  CBG: Recent Labs  Lab 08/11/21 2045 08/12/21 0002 08/12/21 0404 08/12/21 0739 08/12/21 1102  GLUCAP  267* 266* 165* 113* 163*    Microbiology: Results for orders placed or performed during the hospital encounter of 08/11/21  Resp Panel by RT-PCR (Flu A&B, Covid) Nasopharyngeal Swab     Status: Abnormal   Collection Time: 08/11/21 11:50 AM   Specimen: Nasopharyngeal Swab; Nasopharyngeal(NP) swabs in vial transport medium  Result Value Ref Range Status   SARS Coronavirus 2 by RT PCR NEGATIVE NEGATIVE Final    Comment: (NOTE) SARS-CoV-2 target nucleic acids are NOT DETECTED.  The SARS-CoV-2 RNA is generally detectable in upper respiratory specimens during the acute phase of infection. The lowest concentration of SARS-CoV-2 viral copies this assay can detect is 138 copies/mL. A negative result does not preclude SARS-Cov-2 infection and should not be used as the sole basis for treatment or other patient management decisions. A negative result may occur with  improper specimen  collection/handling, submission of specimen other than nasopharyngeal swab, presence of viral mutation(s) within the areas targeted by this assay, and inadequate number of viral copies(<138 copies/mL). A negative result must be combined with clinical observations, patient history, and epidemiological information. The expected result is Negative.  Fact Sheet for Patients:  EntrepreneurPulse.com.au  Fact Sheet for Healthcare Providers:  IncredibleEmployment.be  This test is no t yet approved or cleared by the [place] FDA and  has been authorized for detection and/or diagnosis of SARS-CoV-2 by FDA under an Emergency Use Authorization (EUA). This EUA will remain  in effect (meaning this test can be used) for the duration of the COVID-19 declaration under Section 564(b)(1) of the Act, 21 U.S.C.section 360bbb-3(b)(1), unless the authorization is terminated  or revoked sooner.       Influenza A by PCR POSITIVE (A) NEGATIVE Final   Influenza B by PCR NEGATIVE NEGATIVE Final    Comment: (NOTE) The Xpert Xpress SARS-CoV-2/FLU/RSV plus assay is intended as an aid in the diagnosis of influenza from Nasopharyngeal swab specimens and should not be used as a sole basis for treatment. Nasal washings and aspirates are unacceptable for Xpert Xpress SARS-CoV-2/FLU/RSV testing.  Fact Sheet for Patients: EntrepreneurPulse.com.au  Fact Sheet for Healthcare Providers: IncredibleEmployment.be  This test is not yet approved or cleared by the [place] FDA and has been authorized for detection and/or diagnosis of SARS-CoV-2 by FDA under an Emergency Use Authorization (EUA). This EUA will remain in effect (meaning this test can be used) for the duration of the COVID-19 declaration under Section 564(b)(1) of the Act, 21 U.S.C. section 360bbb-3(b)(1), unless the authorization is terminated or revoked.  Performed at [facility], [address] [place], [place] [address]     Coagulation Studies: No results for input(s): LABPROT, INR in the last 72 hours.  Urinalysis: Recent Labs    08/10/21 0252 08/11/21 1421  COLORURINE YELLOW YELLOW  LABSPEC 1.015 1.020  PHURINE 6.0 6.0  GLUCOSEU >=500* >=500*  HGBUR SMALL* SMALL*  BILIRUBINUR NEGATIVE NEGATIVE  KETONESUR 15* NEGATIVE  PROTEINUR >300* >300*  NITRITE POSITIVE* NEGATIVE  LEUKOCYTESUR NEGATIVE NEGATIVE      Imaging: DG Chest 2 View  Result Date: 08/11/2021 CLINICAL DATA:  25 year old female with history of shortness of breath and vomiting for the past 5 days. EXAM: CHEST - 2 VIEW COMPARISON:  Chest x-ray 05/12/2021. FINDINGS: There is cephalization of the pulmonary vasculature and slight indistinctness of the interstitial markings suggestive of mild pulmonary edema. Trace bilateral pleural effusions. Mild cardiomegaly. Upper mediastinal contours are within normal limits. IMPRESSION: 1. Findings are concerning for mild congestive heart  failure, as above. Electronically Signed   By: Vinnie Langton M.D.   On: 08/11/2021 12:16     Medications:    sodium chloride     dialysis solution 1.5% low-MG/low-CA     dialysis solution 2.5% low-MG/low-CA      atorvastatin  80 mg Oral Daily   calcitRIOL  0.25 mcg Oral Daily   carvedilol  12.5 mg Oral BID WC   gentamicin cream  1 application Topical Daily   heparin  5,000 Units Subcutaneous Q8H   hydrALAZINE  100 mg Oral TID   insulin aspart  0-6 Units Subcutaneous Q4H   insulin detemir  10 Units Subcutaneous BID   isosorbide mononitrate  30 mg Oral Daily   methocarbamol  500 mg Oral QID   sodium chloride flush  3 mL Intravenous Q12H   sodium chloride, acetaminophen, butalbital-acetaminophen-caffeine, LORazepam, sodium chloride flush  Assessment/ Plan:  ESRD-continue peritoneal dialysis.  4 cycles 9 hours max 1.5/2% dextrose.  Peritoneal fluid culture has been ordered. ANEMIA-we  will check some iron studies MBD-continue to follow calcium phosphorus HTN/VOL-appears to be ultrafiltrating well ACCESS-Tenckhoff catheter site clean dry no signs of infection    LOS: 1 Sherril Croon @TODAY @11 :08 AM

## 2021-08-12 NOTE — Plan of Care (Signed)

## 2021-08-12 NOTE — Progress Notes (Signed)
Inpatient Diabetes Program Recommendations  AACE/ADA: New Consensus Statement on Inpatient Glycemic Control (2015)  Target Ranges:  Prepandial:   less than 140 mg/dL      Peak postprandial:   less than 180 mg/dL (1-2 hours)      Critically ill patients:  140 - 180 mg/dL   Lab Results  Component Value Date   GLUCAP 163 (H) 08/12/2021   HGBA1C 8.6 (H) 02/16/2021    Placed Freestyle Libre to back of left arm.  Educated patient and family on how to set up on smart phone and use with the app.  Provided patient with additional CGM for replacement in 14 days. All questions answered.    Will continue to follow while inpatient.  Thank you, Reche Dixon, RN, BSN Diabetes Coordinator Inpatient Diabetes Program [phone] (team pager from 8a-5p)

## 2021-08-12 NOTE — Progress Notes (Signed)
PHARMACY NOTE:  ANTIMICROBIAL RENAL DOSAGE ADJUSTMENT  Current antimicrobial regimen includes a mismatch between antimicrobial dosage and estimated renal function.  As per policy approved by the Pharmacy & Therapeutics and Medical Executive Committees, the antimicrobial dosage will be adjusted accordingly.  Current antimicrobial dosage:  Oseltamivir 75mg  x1 given 08/11/21  Indication: Flu A  Renal Function:  Estimated Creatinine Clearance: 10.5 mL/min (A) (by C-G formula based on SCr of 6.95 mg/dL (H)). [x]      On peritoneal dialysis []      On CRRT     Additional comments: One 75mg  oral dose provides 5 days of coverage. Given patient is being continued on peritoneal dialysis no redosing is indicated.   Thank you for allowing pharmacy to be a part of this patient's care.  Erin Hearing PharmD., BCPS Clinical Pharmacist 08/12/2021 9:21 AM

## 2021-08-12 NOTE — Discharge Summary (Signed)
Physician Discharge Summary   Patient ID: Kathy Lewis MRN: [id number] DOB/AGE: 03-Oct-1995 25 y.o.  Admit date: 08/11/2021 Discharge date: 08/12/2021  Primary Care Physician:  [facility]   Recommendations for Outpatient Follow-up:  Follow up with PCP in 1-2 weeks  Home Health: None, back to baseline Equipment/Devices:   Discharge Condition: stable  CODE STATUS: FULL  Diet recommendation: Carb modified diet   Discharge Diagnoses:     Acute on chronic systolic CHF (congestive heart failure) (HCC)  Prolonged QT interval  CKD (chronic kidney disease), stage V ([place]) on peritoneal dialysis  Type 1 diabetes mellitus with ketoacidosis, uncontrolled ([place])  Influenza A  Hyperkalemia  Nausea and vomiting  HTN (hypertension)   Consults: Nephrology    Allergies:   Allergies  Allergen Reactions   Cantaloupe Extract Allergy Skin Test Itching    Mouth itching     Strawberry Extract     Other reaction(s): Itching Mouth itches Mouth itches   Citrullus Vulgaris Other (See Comments)    Makes mouth itch , ALL melons Makes mouth itch , ALL melons   Nsaids     Avoid per nephrology     DISCHARGE MEDICATIONS: Allergies as of 08/12/2021       Reactions   Cantaloupe Extract Allergy Skin Test Itching   Mouth itching   Strawberry Extract    Other reaction(s): Itching Mouth itches Mouth itches   Citrullus Vulgaris Other (See Comments)   Makes mouth itch , ALL melons Makes mouth itch , ALL melons   Nsaids    Avoid per nephrology        Medication List     STOP taking these medications    ondansetron 4 MG tablet Commonly known as: ZOFRAN       TAKE these medications    albuterol 108 (90 Base) MCG/ACT inhaler Commonly known as: VENTOLIN HFA Inhale 2 puffs into the lungs every 6 (six) hours as needed for wheezing or shortness of breath.   atorvastatin 40 MG tablet Commonly known as: LIPITOR Take 80 mg by mouth daily.    butalbital-acetaminophen-caffeine 50-325-40 MG tablet Commonly known as: FIORICET Take 1 tablet by mouth daily as needed for headache.   calcitRIOL 0.25 MCG capsule Commonly known as: ROCALTROL Take 0.25 mcg by mouth daily.   carvedilol 12.5 MG tablet Commonly known as: COREG Take 12.5 mg by mouth 2 (two) times daily with a meal.   FeroSul 325 (65 FE) MG tablet Generic drug: ferrous sulfate Take 325 mg by mouth 3 (three) times daily.   gentamicin cream 0.1 % Commonly known as: GARAMYCIN Apply 1 application topically daily. Apply to exit site.   hydrALAZINE 100 MG tablet Commonly known as: APRESOLINE Take 100 mg by mouth 3 (three) times daily.   insulin detemir 100 UNIT/ML injection Commonly known as: LEVEMIR Inject 0.1 mLs (10 Units total) into the skin 2 (two) times daily.   insulin lispro 100 UNIT/ML injection Commonly known as: HUMALOG Inject 0.02 mLs (2 Units total) into the skin 3 (three) times daily with meals. What changed:  how much to take when to take this   INSULIN SYRINGE .3CC/31GX5/16" 31G X 5/16" 0.3 ML Misc Use as directed   isosorbide mononitrate 30 MG 24 hr tablet Commonly known as: IMDUR Take 30 mg by mouth daily.   losartan 50 MG tablet Commonly known as: COZAAR Take 50 mg by mouth daily.   magnesium oxide 400 MG tablet Commonly known as: MAG-OX Take by mouth.  medroxyPROGESTERone 150 MG/ML injection Commonly known as: DEPO-PROVERA Inject 1 mL (150 mg total) into the muscle every 3 (three) months.   methocarbamol 500 MG tablet Commonly known as: ROBAXIN Take 500 mg by mouth 4 (four) times daily.   prochlorperazine 5 MG tablet Commonly known as: COMPAZINE Take 1 tablet (5 mg total) by mouth every 6 (six) hours as needed for nausea or vomiting.   torsemide 20 MG tablet Commonly known as: DEMADEX Take 20-40 mg by mouth See admin instructions. Takes 2 tablets in the morning and 1 tablet at lunch   Vitamin D (Ergocalciferol) 1.25 MG  (50000 UNIT) Caps capsule Commonly known as: DRISDOL Take 50,000 Units by mouth every 7 (seven) days.               Discharge Care Instructions  (From admission, onward)           Start     Ordered   08/12/21 0000  Discharge wound care:       Comments: Clean skin at the exit site with ChloraPrep swab sticks.  Then apply gentamicin cream to the site once daily.  Cover with dry dressing.   08/12/21 1236             Brief H and P: For complete details please refer to admission H and P, but in brief, patient is a 25 year old female with history of postpartum cardiomyopathy, EF 25 to 30% in 04/2021, CKD on PD, poorly controlled type 1 diabetes mellitus, chronic anemia presented to ED with hyperglycemia, nausea vomiting, cough, central chest discomfort.  Patient also reported 2 days of nausea with nonbloody vomiting, couple of loose stools over this interval but otherwise no abdominal pain, no fevers or chills.  She had a recent increase in mild bilateral ankle edema. In the ED, EKG showed QTC of 635.  Chest x-ray with cephalization of vasculature and likely mild pulmonary edema.  Glucose 299, potassium 5.7, bicarb 21.  BNP 1279.  Influenza A positive.  Nephrology was consulted.  Patient was admitted for further work-up.  Hospital Course:     Acute on chronic systolic CHF (congestive heart failure) (HCC) in the setting of CKD stage IV on peritoneal dialysis, postpartum cardiomyopathy -Patient presented with mild fluid overload, elevated BNP, mild pulmonary edema on chest x-ray -EF 25 to 30% in September 2022, attributed to postpartum cardiomyopathy.  Patient received IV Lasix for diuresis, peritoneal dialysis resumed. -Currently feels close to baseline, no hypoxia.  Cleared by nephrology for discharge.  Influenza A positive -Patient received Tamiflu x1, and per pharmacy, dose appropriate for peritoneal dialysis patient    Type 1 diabetes mellitus with ketoacidosis, uncontrolled  ([place]) -Likely worsened due to nausea vomiting, poor oral intake -Diabetic coordinator consult was obtained. -Continue Levemir 10 units twice daily, Humalog 2 units 3 times daily AC, outpatient follow-up with PCP closely for good glycemic control    Nausea and vomiting -Resolved, likely due to influenza A. -Zofran discontinued due to prolonged QTC, currently nausea vomiting improved, tolerating diet  Hyperkalemia -Potassium 5.7, patient was treated with Lokelma, Lasix  She is cleared to be discharged home   Day of Discharge S: No acute complaints.  No active nausea or vomiting, tolerating diet.  BP 115/83 (BP Location: Right Arm)    Pulse 84    Temp 98.3 F (36.8 C) (Oral)    Resp 13    Ht 5' (1.524 m)    Wt 66.4 kg    LMP  (LMP Unknown)  SpO2 99%    BMI 28.59 kg/m   Physical Exam: General: Alert and awake oriented x3 not in any acute distress. CVS: S1-S2 clear no murmur rubs or gallops Chest: clear to auscultation bilaterally, no wheezing rales or rhonchi Abdomen: soft nontender, nondistended, normal bowel sounds, PD catheter in place Extremities: no cyanosis, clubbing or edema noted bilaterally Neuro: no new deficits    Get Medicines reviewed and adjusted: Please take all your medications with you for your next visit with your Primary MD  Please request your Primary MD to go over all hospital tests and procedure/radiological results at the follow up. Please ask your Primary MD to get all Hospital records sent to his/her office.  If you experience worsening of your admission symptoms, develop shortness of breath, life threatening emergency, suicidal or homicidal thoughts you must seek medical attention immediately by calling 911 or calling your MD immediately  if symptoms less severe.  You must read complete instructions/literature along with all the possible adverse reactions/side effects for all the Medicines you take and that have been prescribed to you. Take any new  Medicines after you have completely understood and accept all the possible adverse reactions/side effects.   Do not drive when taking pain medications.   Do not take more than prescribed Pain, Sleep and Anxiety Medications  Special Instructions: If you have smoked or chewed Tobacco  in the last 2 yrs please stop smoking, stop any regular Alcohol  and or any Recreational drug use.  Wear Seat belts while driving.  Please note  You were cared for by a hospitalist during your hospital stay. Once you are discharged, your primary care physician will handle any further medical issues. Please note that NO REFILLS for any discharge medications will be authorized once you are discharged, as it is imperative that you return to your primary care physician (or establish a relationship with a primary care physician if you do not have one) for your aftercare needs so that they can reassess your need for medications and monitor your lab values.   The results of significant diagnostics from this hospitalization (including imaging, microbiology, ancillary and laboratory) are listed below for reference.      Procedures/Studies:  DG Chest 2 View  Result Date: 08/11/2021 CLINICAL DATA:  25 year old female with history of shortness of breath and vomiting for the past 5 days. EXAM: CHEST - 2 VIEW COMPARISON:  Chest x-ray 05/12/2021. FINDINGS: There is cephalization of the pulmonary vasculature and slight indistinctness of the interstitial markings suggestive of mild pulmonary edema. Trace bilateral pleural effusions. Mild cardiomegaly. Upper mediastinal contours are within normal limits. IMPRESSION: 1. Findings are concerning for mild congestive heart failure, as above. Electronically Signed   By: Vinnie Langton M.D.   On: 08/11/2021 12:16      LAB RESULTS: Basic Metabolic Panel: Recent Labs  Lab 08/11/21 1143 08/11/21 2126 08/12/21 0346  NA 132*  --  135  K 5.7*  --  4.5  CL 101  --  102  CO2 21*   --  23  GLUCOSE 299*  --  171*  BUN 70*  --  65*  CREATININE 6.55*  --  6.95*  CALCIUM 7.3*  --  7.4*  MG  --  2.1  --   PHOS  --   --  6.0*   Liver Function Tests: Recent Labs  Lab 08/10/21 0234 08/11/21 1143  AST 45* 40  ALT 31 35  ALKPHOS 91 97  BILITOT 0.6 0.3  PROT 5.0* 4.8*  ALBUMIN 2.1* 2.1*   Recent Labs  Lab 08/11/21 1143  LIPASE 23   No results for input(s): AMMONIA in the last 168 hours. CBC: Recent Labs  Lab 08/11/21 1143 08/12/21 0346  WBC 3.2* 2.3*  NEUTROABS 1.9  --   HGB 7.7* 7.8*  HCT 23.7* 23.5*  MCV 96.7 95.9  PLT 231 213   Cardiac Enzymes: No results for input(s): CKTOTAL, CKMB, CKMBINDEX, TROPONINI in the last 168 hours. BNP: Invalid input(s): POCBNP CBG: Recent Labs  Lab 08/12/21 0739 08/12/21 1102  GLUCAP 113* 163*       Disposition and Follow-up: Discharge Instructions     Diet - low sodium heart healthy   Complete by: As directed    Discharge wound care:   Complete by: As directed    Clean skin at the exit site with ChloraPrep swab sticks.  Then apply gentamicin cream to the site once daily.  Cover with dry dressing.   Increase activity slowly   Complete by: As directed         DISPOSITION: [facility]. Schedule an appointment as soon as possible for a visit in 2 week(s).   Specialty: Family Medicine Why: for hospital follow-up                 Time coordinating discharge:  8mins   Signed:   Estill Cotta M.D. [facility] 08/12/2021, 12:39 PM

## 2021-08-15 LAB — BODY FLUID CULTURE W GRAM STAIN: Culture: NO GROWTH

## 2021-08-17 LAB — PATHOLOGIST SMEAR REVIEW

## 2021-10-16 ENCOUNTER — Emergency Department (HOSPITAL_BASED_OUTPATIENT_CLINIC_OR_DEPARTMENT_OTHER): Payer: Medicaid Other

## 2021-10-16 ENCOUNTER — Emergency Department (HOSPITAL_BASED_OUTPATIENT_CLINIC_OR_DEPARTMENT_OTHER)
Admission: EM | Admit: 2021-10-16 | Discharge: 2021-10-16 | Disposition: A | Discharge status: Home / Self Care | Payer: Medicaid Other | Attending: Emergency Medicine | Admitting: Emergency Medicine

## 2021-10-16 ENCOUNTER — Other Ambulatory Visit: Payer: Self-pay

## 2021-10-16 DIAGNOSIS — I509 Heart failure, unspecified: Secondary | ICD-10-CM | POA: Insufficient documentation

## 2021-10-16 DIAGNOSIS — Z992 Dependence on renal dialysis: Secondary | ICD-10-CM | POA: Insufficient documentation

## 2021-10-16 DIAGNOSIS — Z20822 Contact with and (suspected) exposure to covid-19: Secondary | ICD-10-CM | POA: Insufficient documentation

## 2021-10-16 DIAGNOSIS — I110 Hypertensive heart disease with heart failure: Secondary | ICD-10-CM | POA: Insufficient documentation

## 2021-10-16 DIAGNOSIS — Z794 Long term (current) use of insulin: Secondary | ICD-10-CM | POA: Diagnosis not present

## 2021-10-16 DIAGNOSIS — J189 Pneumonia, unspecified organism: Secondary | ICD-10-CM | POA: Diagnosis not present

## 2021-10-16 DIAGNOSIS — Z79899 Other long term (current) drug therapy: Secondary | ICD-10-CM | POA: Insufficient documentation

## 2021-10-16 DIAGNOSIS — R059 Cough, unspecified: Secondary | ICD-10-CM | POA: Diagnosis present

## 2021-10-16 DIAGNOSIS — N183 Chronic kidney disease, stage 3 unspecified: Secondary | ICD-10-CM | POA: Insufficient documentation

## 2021-10-16 DIAGNOSIS — E1022 Type 1 diabetes mellitus with diabetic chronic kidney disease: Secondary | ICD-10-CM | POA: Diagnosis not present

## 2021-10-16 LAB — RESP PANEL BY RT-PCR (FLU A&B, COVID) ARPGX2
Influenza A by PCR: NEGATIVE
Influenza B by PCR: NEGATIVE
SARS Coronavirus 2 by RT PCR: NEGATIVE

## 2021-10-16 MED ORDER — DOXYCYCLINE HYCLATE 100 MG PO CAPS: 100.0000 mg | ORAL_CAPSULE | Freq: Two times a day (BID) | ORAL | 0 refills | Status: DC

## 2021-10-16 NOTE — ED Triage Notes (Signed)
Presents with cough and congestion x 1 week, non-productive, states it has been difficult to sleep at night

## 2021-10-16 NOTE — ED Notes (Signed)
ED Provider at bedside. 

## 2021-10-16 NOTE — ED Provider Notes (Signed)
[place] EMERGENCY DEPARTMENT Provider Note   CSN: [id number] Arrival date & time: 10/16/21  4496     History  Chief Complaint  Patient presents with   Cough    Kathy Lewis is a 26 y.o. female.   Cough Patient presents with cough and nasal congestion.  Is about a week.  States had some yellow sputum production.  No fevers.  No chills.  Does have a history of volume overload and was recently admitted to the hospital.  Does not really feel short of breath.  She is worried about pneumonia   Past Medical History:  Diagnosis Date   Anxiety    Bipolar 2 disorder ([place])    CHF (congestive heart failure) (HCC)    CKD (chronic kidney disease), stage III (HCC)    Depression    DKA (diabetic ketoacidoses)    HSV infection    on valtrex   Hypokalemia    Leukocytosis    Migraine    Noncompliance with medication regimen    Preeclampsia    Severe anemia    Type 1 diabetes mellitus ([place])    Past Surgical History:  Procedure Laterality Date   CARDIAC CATHETERIZATION     DILATION AND EVACUATION N/A 10/22/2019   Procedure: ULTRASOUND GUIDED DILATATION AND EVACUATION;  Surgeon: [name], MD;  Location: [facility] LD ORS;  Service: Gynecology;  Laterality: N/A;   NO PAST SURGERIES     peritoneal dialysis catheter insertion     RENAL BIOPSY      Home Medications Prior to Admission medications   Medication Sig Start Date End Date Taking? Authorizing Provider  doxycycline (VIBRAMYCIN) 100 MG capsule Take 1 capsule (100 mg total) by mouth 2 (two) times daily. 10/16/21  Yes Davonna Belling, MD  albuterol (VENTOLIN HFA) 108 (90 Base) MCG/ACT inhaler Inhale 2 puffs into the lungs every 6 (six) hours as needed for wheezing or shortness of breath.  01/15/20 02/16/21  [provider]  atorvastatin (LIPITOR) 40 MG tablet Take 80 mg by mouth daily. 08/03/21   [provider]  butalbital-acetaminophen-caffeine Emelda Brothers) 50-325-40 MG tablet Take 1 tablet by mouth  daily as needed for headache. 02/18/21 02/18/22  Dwyane Dee, MD  calcitRIOL (ROCALTROL) 0.25 MCG capsule Take 0.25 mcg by mouth daily. 08/03/21   [provider]  carvedilol (COREG) 12.5 MG tablet Take 12.5 mg by mouth 2 (two) times daily with a meal.    [provider]  FEROSUL 325 (65 Fe) MG tablet Take 325 mg by mouth 3 (three) times daily. 04/30/21   [provider]  gentamicin cream (GARAMYCIN) 0.1 % Apply 1 application topically daily. Apply to exit site. 08/12/21   Rai, Vernelle Emerald, MD  hydrALAZINE (APRESOLINE) 100 MG tablet Take 100 mg by mouth 3 (three) times daily. 08/05/21   [provider]  insulin detemir (LEVEMIR) 100 UNIT/ML injection Inject 0.1 mLs (10 Units total) into the skin 2 (two) times daily. 08/12/21   Rai, Ripudeep K, MD  insulin lispro (HUMALOG) 100 UNIT/ML injection Inject 0.02 mLs (2 Units total) into the skin 3 (three) times daily with meals. 08/12/21   Rai, Vernelle Emerald, MD  Insulin Syringe-Needle U-100 (INSULIN SYRINGE .3CC/31GX5/16") 31G X 5/16" 0.3 ML MISC Use as directed 10/27/19   [name], MD  isosorbide mononitrate (IMDUR) 30 MG 24 hr tablet Take 30 mg by mouth daily.    [provider]  losartan (COZAAR) 50 MG tablet Take 50 mg by mouth daily. 08/10/21   [provider]  magnesium oxide (MAG-OX) 400 MG tablet Take by mouth. 08/03/21   [provider]  medroxyPROGESTERone (DEPO-PROVERA) 150 MG/ML injection Inject 1 mL (150 mg total) into the muscle every 3 (three) months. Patient not taking: Reported on 08/11/2021 03/29/21   Gavin Pound, CNM  methocarbamol (ROBAXIN) 500 MG tablet Take 500 mg by mouth 4 (four) times daily. 03/09/21   [provider]  prochlorperazine (COMPAZINE) 5 MG tablet Take 1 tablet (5 mg total) by mouth every 6 (six) hours as needed for nausea or vomiting. 08/12/21   Rai, Ripudeep K, MD  torsemide (DEMADEX) 20 MG tablet Take 20-40 mg by mouth See admin instructions. Takes 2  tablets in the morning and 1 tablet at lunch    [provider]  Vitamin D, Ergocalciferol, (DRISDOL) 1.25 MG (50000 UNIT) CAPS capsule Take 50,000 Units by mouth every 7 (seven) days.    [provider]  escitalopram (LEXAPRO) 10 MG tablet Take 20 mg by mouth daily.  05/10/20 10/06/20  [provider]  furosemide (LASIX) 40 MG tablet Take 1 tablet (40 mg total) by mouth daily. Patient not taking: No sig reported 10/28/19 02/17/21  [name], MD  lisinopril (ZESTRIL) 10 MG tablet Take 10 mg by mouth daily.  02/23/20 10/06/20  [provider]  spironolactone (ALDACTONE) 25 MG tablet Take 25 mg by mouth daily.  04/20/20 10/06/20  [provider]      Allergies    Cantaloupe extract allergy skin test, Strawberry extract, Citrullus vulgaris, and Nsaids    Review of Systems   Review of Systems  Respiratory:  Positive for cough.    Physical Exam Updated Vital Signs BP (!) 125/99    Pulse 85    Temp 98.2 F (36.8 C) (Oral)    Resp 18    Ht 5\' 2"  (1.575 m)    Wt 66.7 kg    SpO2 99%    BMI 26.89 kg/m  Physical Exam  ED Results / Procedures / Treatments   Labs (all labs ordered are listed, but only abnormal results are displayed) Labs Reviewed  RESP PANEL BY RT-PCR (FLU A&B, COVID) ARPGX2    EKG None  Radiology DG Chest 2 View  Result Date: 10/16/2021 CLINICAL DATA:  Cough.  Congestion for 1 week. EXAM: CHEST - 2 VIEW COMPARISON:  08/11/2021 FINDINGS: Unchanged cardiac enlargement. No signs of pleural effusion or edema. No airspace opacities identified. Visualized osseous structures appear intact. IMPRESSION: Cardiac enlargement.  No signs of heart failure. Electronically Signed   By: Kerby Moors M.D.   On: 10/16/2021 09:12    Procedures Procedures    Medications Ordered in ED Medications - No data to display  ED Course/ Medical Decision Making/ A&P                           Medical Decision Making Problems Addressed: Community  acquired pneumonia of left lung, unspecified part of lung: acute illness or injury  Amount and/or Complexity of Data Reviewed External Data Reviewed: notes.    Details: Previous admission note. Labs:  Decision-making details documented in ED Course. Radiology: ordered and independent interpretation performed. Decision-making details documented in ED Course.  Risk Prescription drug management.  Initial differential diagnosis for shortness of breath and cough includes life-threatening conditions such as pneumonia. Patient with history of renal disease.  Shortness of breath and cough.  Has had for around a week now.  Not hypoxic.  Flu  and COVID testing done and negative.  Chest x-ray done and did not show pneumonia or CHF, however does have focal rales at the bases particular on the left side.  This could clinically be pneumonia.  She is higher risk and will treat for clinical pneumonia.  Appears stable for discharge home        Final Clinical Impression(s) / ED Diagnoses Final diagnoses:  Community acquired pneumonia of left lung, unspecified part of lung    Rx / DC Orders ED Discharge Orders          Ordered    doxycycline (VIBRAMYCIN) 100 MG capsule  2 times daily        10/16/21 0943              Davonna Belling, MD 10/16/21 [id number]

## 2021-11-22 ENCOUNTER — Observation Stay (HOSPITAL_BASED_OUTPATIENT_CLINIC_OR_DEPARTMENT_OTHER)
Admission: EM | Admit: 2021-11-22 | Discharge: 2021-11-24 | Disposition: A | Discharge status: Home / Self Care | Payer: Medicaid Other | Attending: Internal Medicine | Admitting: Internal Medicine

## 2021-11-22 ENCOUNTER — Emergency Department (HOSPITAL_BASED_OUTPATIENT_CLINIC_OR_DEPARTMENT_OTHER): Payer: Medicaid Other

## 2021-11-22 ENCOUNTER — Encounter (HOSPITAL_BASED_OUTPATIENT_CLINIC_OR_DEPARTMENT_OTHER): Payer: Self-pay | Admitting: *Deleted

## 2021-11-22 ENCOUNTER — Other Ambulatory Visit: Payer: Self-pay

## 2021-11-22 DIAGNOSIS — Z992 Dependence on renal dialysis: Secondary | ICD-10-CM | POA: Diagnosis not present

## 2021-11-22 DIAGNOSIS — Z7984 Long term (current) use of oral hypoglycemic drugs: Secondary | ICD-10-CM | POA: Insufficient documentation

## 2021-11-22 DIAGNOSIS — I5023 Acute on chronic systolic (congestive) heart failure: Secondary | ICD-10-CM | POA: Diagnosis not present

## 2021-11-22 DIAGNOSIS — I1310 Hypertensive heart and chronic kidney disease without heart failure, with stage 1 through stage 4 chronic kidney disease, or unspecified chronic kidney disease: Secondary | ICD-10-CM | POA: Insufficient documentation

## 2021-11-22 DIAGNOSIS — N186 End stage renal disease: Secondary | ICD-10-CM | POA: Diagnosis not present

## 2021-11-22 DIAGNOSIS — N183 Chronic kidney disease, stage 3 unspecified: Secondary | ICD-10-CM | POA: Diagnosis not present

## 2021-11-22 DIAGNOSIS — J9601 Acute respiratory failure with hypoxia: Principal | ICD-10-CM | POA: Diagnosis present

## 2021-11-22 DIAGNOSIS — Z7901 Long term (current) use of anticoagulants: Secondary | ICD-10-CM | POA: Insufficient documentation

## 2021-11-22 DIAGNOSIS — E785 Hyperlipidemia, unspecified: Secondary | ICD-10-CM | POA: Diagnosis not present

## 2021-11-22 DIAGNOSIS — E1010 Type 1 diabetes mellitus with ketoacidosis without coma: Secondary | ICD-10-CM

## 2021-11-22 DIAGNOSIS — J029 Acute pharyngitis, unspecified: Secondary | ICD-10-CM

## 2021-11-22 DIAGNOSIS — R0902 Hypoxemia: Secondary | ICD-10-CM | POA: Diagnosis not present

## 2021-11-22 DIAGNOSIS — D649 Anemia, unspecified: Secondary | ICD-10-CM | POA: Diagnosis not present

## 2021-11-22 DIAGNOSIS — R051 Acute cough: Secondary | ICD-10-CM | POA: Diagnosis present

## 2021-11-22 DIAGNOSIS — Z79899 Other long term (current) drug therapy: Secondary | ICD-10-CM | POA: Insufficient documentation

## 2021-11-22 DIAGNOSIS — Z20822 Contact with and (suspected) exposure to covid-19: Secondary | ICD-10-CM | POA: Insufficient documentation

## 2021-11-22 DIAGNOSIS — K3184 Gastroparesis: Secondary | ICD-10-CM | POA: Insufficient documentation

## 2021-11-22 DIAGNOSIS — R9389 Abnormal findings on diagnostic imaging of other specified body structures: Secondary | ICD-10-CM

## 2021-11-22 DIAGNOSIS — I50810 Right heart failure, unspecified: Secondary | ICD-10-CM | POA: Diagnosis not present

## 2021-11-22 DIAGNOSIS — E1022 Type 1 diabetes mellitus with diabetic chronic kidney disease: Secondary | ICD-10-CM | POA: Diagnosis not present

## 2021-11-22 DIAGNOSIS — I10 Essential (primary) hypertension: Secondary | ICD-10-CM

## 2021-11-22 DIAGNOSIS — I509 Heart failure, unspecified: Secondary | ICD-10-CM

## 2021-11-22 DIAGNOSIS — Z87891 Personal history of nicotine dependence: Secondary | ICD-10-CM | POA: Insufficient documentation

## 2021-11-22 DIAGNOSIS — J069 Acute upper respiratory infection, unspecified: Secondary | ICD-10-CM

## 2021-11-22 DIAGNOSIS — I2699 Other pulmonary embolism without acute cor pulmonale: Secondary | ICD-10-CM | POA: Insufficient documentation

## 2021-11-22 DIAGNOSIS — I2694 Multiple subsegmental pulmonary emboli without acute cor pulmonale: Secondary | ICD-10-CM

## 2021-11-22 LAB — CBC WITH DIFFERENTIAL/PLATELET
Abs Immature Granulocytes: 0.01 10*3/uL (ref 0.00–0.07)
Basophils Absolute: 0 10*3/uL (ref 0.0–0.1)
Basophils Relative: 1 %
Eosinophils Absolute: 0 10*3/uL (ref 0.0–0.5)
Eosinophils Relative: 1 %
HCT: 27.4 % — ABNORMAL LOW (ref 36.0–46.0)
Hemoglobin: 8.8 g/dL — ABNORMAL LOW (ref 12.0–15.0)
Immature Granulocytes: 0 %
Lymphocytes Relative: 15 %
Lymphs Abs: 0.9 10*3/uL (ref 0.7–4.0)
MCH: 31.4 pg (ref 26.0–34.0)
MCHC: 32.1 g/dL (ref 30.0–36.0)
MCV: 97.9 fL (ref 80.0–100.0)
Monocytes Absolute: 0.2 10*3/uL (ref 0.1–1.0)
Monocytes Relative: 4 %
Neutro Abs: 4.8 10*3/uL (ref 1.7–7.7)
Neutrophils Relative %: 79 %
Platelets: 239 10*3/uL (ref 150–400)
RBC: 2.8 MIL/uL — ABNORMAL LOW (ref 3.87–5.11)
RDW: 13 % (ref 11.5–15.5)
WBC: 6 10*3/uL (ref 4.0–10.5)
nRBC: 0 % (ref 0.0–0.2)

## 2021-11-22 LAB — BASIC METABOLIC PANEL
Anion gap: 7 (ref 5–15)
BUN: 54 mg/dL — ABNORMAL HIGH (ref 6–20)
CO2: 22 mmol/L (ref 22–32)
Calcium: 7.7 mg/dL — ABNORMAL LOW (ref 8.9–10.3)
Chloride: 109 mmol/L (ref 98–111)
Creatinine, Ser: 7.66 mg/dL — ABNORMAL HIGH (ref 0.44–1.00)
GFR, Estimated: 7 mL/min — ABNORMAL LOW (ref >60)
Glucose, Bld: 169 mg/dL — ABNORMAL HIGH (ref 70–99)
Potassium: 4.4 mmol/L (ref 3.5–5.1)
Sodium: 138 mmol/L (ref 135–145)

## 2021-11-22 LAB — RESP PANEL BY RT-PCR (FLU A&B, COVID) ARPGX2
Influenza A by PCR: NEGATIVE
Influenza B by PCR: NEGATIVE
SARS Coronavirus 2 by RT PCR: NEGATIVE

## 2021-11-22 LAB — CBG MONITORING, ED: Glucose-Capillary: 62 mg/dL — ABNORMAL LOW (ref 70–99)

## 2021-11-22 LAB — GROUP A STREP BY PCR: Group A Strep by PCR: NOT DETECTED

## 2021-11-22 LAB — BRAIN NATRIURETIC PEPTIDE: B Natriuretic Peptide: 2727.2 pg/mL — ABNORMAL HIGH (ref 0.0–100.0)

## 2021-11-22 MED ORDER — GENTAMICIN SULFATE 0.1 % EX CREA
1.0000 "application " | TOPICAL_CREAM | Freq: Every day | CUTANEOUS | Status: DC
  Administered 2021-11-23 – 2021-11-24 (×3): 1 via TOPICAL
  Filled 2021-11-22: qty 15

## 2021-11-22 MED ORDER — ACETAMINOPHEN 325 MG PO TABS
650.0000 mg | ORAL_TABLET | Freq: Once | ORAL | Status: AC
  Administered 2021-11-22: 650 mg via ORAL
  Filled 2021-11-22: qty 2

## 2021-11-22 MED ORDER — CARVEDILOL 12.5 MG PO TABS
6.2500 mg | ORAL_TABLET | Freq: Two times a day (BID) | ORAL | Status: DC
  Administered 2021-11-23 – 2021-11-24 (×3): 6.25 mg via ORAL
  Filled 2021-11-22 (×3): qty 1

## 2021-11-22 MED ORDER — APIXABAN 5 MG PO TABS
10.0000 mg | ORAL_TABLET | Freq: Two times a day (BID) | ORAL | Status: DC
Start: 2021-11-22 — End: 2021-11-24
  Administered 2021-11-22 – 2021-11-24 (×4): 10 mg via ORAL
  Filled 2021-11-22 (×4): qty 2

## 2021-11-22 MED ORDER — SODIUM CHLORIDE 0.9% FLUSH
3.0000 mL | Freq: Two times a day (BID) | INTRAVENOUS | Status: DC
  Administered 2021-11-23 – 2021-11-24 (×3): 3 mL via INTRAVENOUS

## 2021-11-22 MED ORDER — ACETAMINOPHEN 325 MG PO TABS
650.0000 mg | ORAL_TABLET | Freq: Four times a day (QID) | ORAL | Status: DC | PRN
  Administered 2021-11-22 – 2021-11-23 (×2): 650 mg via ORAL
  Filled 2021-11-22 (×2): qty 2

## 2021-11-22 MED ORDER — ISOSORBIDE MONONITRATE ER 30 MG PO TB24
30.0000 mg | ORAL_TABLET | Freq: Every day | ORAL | Status: DC
  Administered 2021-11-23 – 2021-11-24 (×2): 30 mg via ORAL
  Filled 2021-11-22 (×2): qty 1

## 2021-11-22 MED ORDER — TORSEMIDE 20 MG PO TABS
40.0000 mg | ORAL_TABLET | Freq: Two times a day (BID) | ORAL | Status: DC
  Administered 2021-11-23 – 2021-11-24 (×3): 40 mg via ORAL
  Filled 2021-11-22 (×3): qty 2

## 2021-11-22 MED ORDER — SACUBITRIL-VALSARTAN 24-26 MG PO TABS
1.0000 | ORAL_TABLET | Freq: Two times a day (BID) | ORAL | Status: DC
  Administered 2021-11-22 – 2021-11-24 (×4): 1 via ORAL
  Filled 2021-11-22 (×4): qty 1

## 2021-11-22 MED ORDER — HEPARIN (PORCINE) 25000 UT/250ML-% IV SOLN
1050.0000 [IU]/h | INTRAVENOUS | Status: DC
  Administered 2021-11-22: 1050 [IU]/h via INTRAVENOUS
  Filled 2021-11-22: qty 250

## 2021-11-22 MED ORDER — ACETAMINOPHEN 650 MG RE SUPP
650.0000 mg | Freq: Four times a day (QID) | RECTAL | Status: DC | PRN
Start: 2021-11-22 — End: 2021-11-24

## 2021-11-22 MED ORDER — INSULIN PUMP
Freq: Three times a day (TID) | SUBCUTANEOUS | Status: DC
  Filled 2021-11-22: qty 1

## 2021-11-22 MED ORDER — BENZONATATE 100 MG PO CAPS
100.0000 mg | ORAL_CAPSULE | Freq: Once | ORAL | Status: AC
  Administered 2021-11-22: 100 mg via ORAL
  Filled 2021-11-22: qty 1

## 2021-11-22 MED ORDER — METHOCARBAMOL 500 MG PO TABS: 500.0000 mg | ORAL_TABLET | Freq: Four times a day (QID) | ORAL | Status: DC | PRN

## 2021-11-22 MED ORDER — ATORVASTATIN CALCIUM 80 MG PO TABS
80.0000 mg | ORAL_TABLET | Freq: Every day | ORAL | Status: DC
  Administered 2021-11-23 – 2021-11-24 (×2): 80 mg via ORAL
  Filled 2021-11-22 (×2): qty 1

## 2021-11-22 MED ORDER — FUROSEMIDE 40 MG PO TABS
40.0000 mg | ORAL_TABLET | Freq: Once | ORAL | Status: AC
  Administered 2021-11-22: 40 mg via ORAL
  Filled 2021-11-22: qty 1

## 2021-11-22 MED ORDER — IOHEXOL 350 MG/ML SOLN
100.0000 mL | Freq: Once | INTRAVENOUS | Status: AC | PRN
  Administered 2021-11-22: 100 mL via INTRAVENOUS

## 2021-11-22 MED ORDER — POLYETHYLENE GLYCOL 3350 17 G PO PACK: 17.0000 g | PACK | Freq: Every day | ORAL | Status: DC | PRN

## 2021-11-22 MED ORDER — HYDRALAZINE HCL 50 MG PO TABS
100.0000 mg | ORAL_TABLET | Freq: Three times a day (TID) | ORAL | Status: DC
Start: 2021-11-23 — End: 2021-11-24
  Administered 2021-11-23 – 2021-11-24 (×4): 100 mg via ORAL
  Filled 2021-11-22 (×4): qty 2

## 2021-11-22 MED ORDER — APIXABAN 5 MG PO TABS
5.0000 mg | ORAL_TABLET | Freq: Two times a day (BID) | ORAL | Status: DC
Start: 2021-11-29 — End: 2021-11-24

## 2021-11-22 MED ORDER — FERROUS SULFATE 325 (65 FE) MG PO TABS
325.0000 mg | ORAL_TABLET | Freq: Every day | ORAL | Status: DC
  Administered 2021-11-23 – 2021-11-24 (×2): 325 mg via ORAL
  Filled 2021-11-22 (×2): qty 1

## 2021-11-22 MED ORDER — HEPARIN BOLUS VIA INFUSION
4000.0000 [IU] | Freq: Once | INTRAVENOUS | Status: AC
  Administered 2021-11-22: 4000 [IU] via INTRAVENOUS

## 2021-11-22 MED ORDER — APIXABAN 5 MG PO TABS: 5.0000 mg | ORAL_TABLET | Freq: Two times a day (BID) | ORAL | Status: DC

## 2021-11-22 MED ORDER — DELFLEX-LC/4.25% DEXTROSE 483 MOSM/L IP SOLN
INTRAPERITONEAL | Status: DC
  Administered 2021-11-23: 5000 mL via INTRAPERITONEAL

## 2021-11-22 MED ORDER — GUAIFENESIN-DM 100-10 MG/5ML PO SYRP
5.0000 mL | ORAL_SOLUTION | ORAL | Status: DC | PRN
  Administered 2021-11-22 – 2021-11-23 (×3): 5 mL via ORAL
  Filled 2021-11-22 (×4): qty 5

## 2021-11-22 MED ORDER — BUTALBITAL-APAP-CAFFEINE 50-325-40 MG PO TABS: 1.0000 | ORAL_TABLET | Freq: Every day | ORAL | Status: DC | PRN

## 2021-11-22 NOTE — ED Notes (Signed)
Pt states she has had a cough for the last couple of days along with congestion and sore throat.  ?

## 2021-11-22 NOTE — Consult Note (Signed)
[facility] ?Renal Consultation Note  ?  ?Indication for Consultation:  Management of ESRD/hemodialysis; anemia, hypertension/volume and secondary hyperparathyroidism ? ?HPI: Kathy Lewis is a 26 y.o. 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  She reports some SOB and cough since she contracted the flu in November but it has worsened recently.  In the ED she was noted to be hypoxic with SpO2 of 94% but dropped to 83-85% with ambulation.  This rose concern for possible pulmonary embolism.  She underwent a CT angio which was negative for central or lobar embolism but did show hypoperfusion in the lingula and lower lobe concerning for subsegmental embolism vs possible air space disease.  Also noted signs of pulmonary edema.  Labs were notable for glucose 169, BUN 54, Cr 7.66, BNP 2727, Hgb 8.8, WBC 6, and negative respiratory panel.  She was transferred to [facility] for further management and we were consulted to provide CCPD during her hospitalization. ? ?She denies any abdominal pain, N/V, cloudy PD effluent, or difficulties with CCPD but admits that she has not been getting a lot of fluid off with CCPD.  She has been using 1/2 2.5%D and 1/2 4.25% D at home. ? ?Past Medical History:  ?Diagnosis Date  ? Anxiety   ? Bipolar 2 disorder ([place])   ? CHF (congestive heart failure) ([place])   ? CKD (chronic kidney disease), stage III ([place])   ? Depression   ? DKA (diabetic ketoacidoses)   ? HSV infection   ? on valtrex  ? Hypokalemia   ? Leukocytosis   ? Migraine   ? Noncompliance with medication regimen   ? Preeclampsia   ? Severe anemia   ? Type 1 diabetes mellitus ([place])   ? ?Past Surgical History:  ?Procedure Laterality Date  ? CARDIAC CATHETERIZATION    ? DILATION AND EVACUATION N/A 10/22/2019  ? Procedure:  ULTRASOUND GUIDED DILATATION AND EVACUATION;  Surgeon: [name], MD;  Location: [facility] LD ORS;  Service: Gynecology;  Laterality: N/A;  ? NO PAST SURGERIES    ? peritoneal dialysis catheter insertion    ? RENAL BIOPSY    ? ?Family History:   ?Family History  ?Adopted: Yes  ?Family history unknown: Yes  ? ?Social History: ? reports that she has quit smoking. Her smoking use included cigars and cigarettes. She smoked an average of 1 pack per day. She has never used smokeless tobacco. She reports that she does not drink alcohol and does not use drugs. ?Allergies  ?Allergen Reactions  ? Cantaloupe Extract Allergy Skin Test Itching  ?  Mouth itching ? ?  ? Strawberry Extract   ?  Other reaction(s): Itching ?Mouth itches ?Mouth itches  ? Citrullus Vulgaris Other (See Comments)  ?  Makes mouth itch , ALL melons ?Makes mouth itch , ALL melons  ? Nsaids   ?  Avoid per nephrology  ? ?Prior to Admission medications   ?Medication Sig Start Date End Date Taking? Authorizing Provider  ?sacubitril-valsartan (ENTRESTO) 24-26 MG Take 1 tablet by mouth 2 (two) times daily.   Yes [provider]  ?albuterol (VENTOLIN HFA) 108 (90 Base) MCG/ACT inhaler Inhale 2 puffs into the lungs every 6 (six) hours as needed for wheezing or shortness of breath.  01/15/20 02/16/21  [provider]  ?atorvastatin (  LIPITOR) 40 MG tablet Take 80 mg by mouth daily. 08/03/21   [provider]  ?butalbital-acetaminophen-caffeine (FIORICET) 50-325-40 MG tablet Take 1 tablet by mouth daily as needed for headache. 02/18/21 02/18/22  Dwyane Dee, MD  ?calcitRIOL (ROCALTROL) 0.25 MCG capsule Take 0.25 mcg by mouth daily. 08/03/21   [provider]  ?carvedilol (COREG) 12.5 MG tablet Take 12.5 mg by mouth 2 (two) times daily with a meal.    [provider]  ?doxycycline (VIBRAMYCIN) 100 MG capsule Take 1 capsule (100 mg total) by mouth 2 (two) times daily. 10/16/21   Davonna Belling, MD  ?FEROSUL 325 (65 Fe) MG  tablet Take 325 mg by mouth 3 (three) times daily. 04/30/21   [provider]  ?gentamicin cream (GARAMYCIN) 0.1 % Apply 1 application topically daily. Apply to exit site. 08/12/21   Rai, Vernelle Emerald, MD  ?hydrALAZINE (APRESOLINE) 100 MG tablet Take 100 mg by mouth 3 (three) times daily. 08/05/21   [provider]  ?insulin detemir (LEVEMIR) 100 UNIT/ML injection Inject 0.1 mLs (10 Units total) into the skin 2 (two) times daily. 08/12/21   Rai, Ripudeep K, MD  ?insulin lispro (HUMALOG) 100 UNIT/ML injection Inject 0.02 mLs (2 Units total) into the skin 3 (three) times daily with meals. 08/12/21   Rai, Vernelle Emerald, MD  ?Insulin Syringe-Needle U-100 (INSULIN SYRINGE .3CC/31GX5/16") 31G X 5/16" 0.3 ML MISC Use as directed 10/27/19   [name], MD  ?isosorbide mononitrate (IMDUR) 30 MG 24 hr tablet Take 30 mg by mouth daily.    [provider]  ?losartan (COZAAR) 50 MG tablet Take 50 mg by mouth daily. 08/10/21   [provider]  ?magnesium oxide (MAG-OX) 400 MG tablet Take by mouth. 08/03/21   [provider]  ?medroxyPROGESTERone (DEPO-PROVERA) 150 MG/ML injection Inject 1 mL (150 mg total) into the muscle every 3 (three) months. ?Patient not taking: Reported on 08/11/2021 03/29/21   Gavin Pound, CNM  ?methocarbamol (ROBAXIN) 500 MG tablet Take 500 mg by mouth 4 (four) times daily. 03/09/21   [provider]  ?prochlorperazine (COMPAZINE) 5 MG tablet Take 1 tablet (5 mg total) by mouth every 6 (six) hours as needed for nausea or vomiting. 08/12/21   Rai, Vernelle Emerald, MD  ?torsemide (DEMADEX) 20 MG tablet Take 20-40 mg by mouth See admin instructions. Takes 2 tablets in the morning and 1 tablet at lunch    [provider]  ?Vitamin D, Ergocalciferol, (DRISDOL) 1.25 MG (50000 UNIT) CAPS capsule Take 50,000 Units by mouth every 7 (seven) days.    [provider]  ?escitalopram (LEXAPRO) 10 MG tablet Take 20 mg by mouth daily.  05/10/20 10/06/20  [provider]  ?furosemide (LASIX) 40 MG tablet Take 1 tablet (40 mg total) by mouth daily. ?Patient not taking: No sig reported 10/28/19 02/17/21  [name], MD  ?lisinopril (ZESTRIL) 10 MG tablet Take 10 mg by mouth daily.  02/23/20 10/06/20  [provider]  ?spironolactone (ALDACTONE) 25 MG tablet Take 25 mg by mouth daily.  04/20/20 10/06/20  [provider]  ? ?Current Facility-Administered Medications  ?Medication Dose Route Frequency Provider Last Rate Last Admin  ? dialysis solution 4.25% low-MG/low-CA dianeal solution   Intraperitoneal Q24H Donato Heinz, MD      ? gentamicin cream (GARAMYCIN) 0.1 % 1 application.  1 application. Topical Daily Donato Heinz, MD      ? heparin ADULT infusion 100 units/mL (25000 units/256mL)  1,050 Units/hr Intravenous Continuous Wise, Nason S,  RPH 10.5 mL/hr at 11/22/21 1718 1,050 Units/hr at 11/22/21 1718  ? ?Labs: ?Basic Metabolic Panel: ?Recent Labs  ?Lab 11/22/21 ?1027  ?NA 138  ?K 4.4  ?CL 109  ?CO2 22  ?GLUCOSE 169*  ?BUN 54*  ?CREATININE 7.66*  ?CALCIUM 7.7*  ? ?Liver Function Tests: ?No results for input(s): AST, ALT, ALKPHOS, BILITOT, PROT, ALBUMIN in the last 168 hours. ?No results for input(s): LIPASE, AMYLASE in the last 168 hours. ?No results for input(s): AMMONIA in the last 168 hours. ?CBC: ?Recent Labs  ?Lab 11/22/21 ?1027  ?WBC 6.0  ?NEUTROABS 4.8  ?HGB 8.8*  ?HCT 27.4*  ?MCV 97.9  ?PLT 239  ? ?Cardiac Enzymes: ?No results for input(s): CKTOTAL, CKMB, CKMBINDEX, TROPONINI in the last 168 hours. ?CBG: ?Recent Labs  ?Lab 11/22/21 ?1406  ?GLUCAP 62*  ? ?Iron Studies: No results for input(s): IRON, TIBC, TRANSFERRIN, FERRITIN in the last 72 hours. ?Studies/Results: ?DG Chest 2 View ? ?Addendum Date: 11/22/2021   ?ADDENDUM REPORT: 11/22/2021 15:43 ADDENDUM: No skeletal stigmata of sickle cell disease. This was reported in error. Electronically Signed   By: Zetta Bills M.D.   On: 11/22/2021 15:43  ? ?Result Date: 11/22/2021 ?CLINICAL  DATA:  A 26 year old female presents with history of cough, pneumonia for 2 days. EXAM: CHEST - 2 VIEW COMPARISON:  Comparison made with October 16, 2021. FINDINGS: Trachea midline. Cardiomediastinal contou

## 2021-11-22 NOTE — Plan of Care (Signed)
POC initiated and progressing. 

## 2021-11-22 NOTE — ED Notes (Signed)
Pt BG 93 with self monitor, states has an insulin pump in place. Given meal as requested. NAD noted. ?

## 2021-11-22 NOTE — Progress Notes (Signed)
ANTICOAGULATION CONSULT NOTE - Initial Consult ? ?Pharmacy Consult for heparin ?Indication: pulmonary embolus ? ?Allergies  ?Allergen Reactions  ? Cantaloupe Extract Allergy Skin Test Itching  ?  Mouth itching ? ?  ? Strawberry Extract   ?  Other reaction(s): Itching ?Mouth itches ?Mouth itches  ? Citrullus Vulgaris Other (See Comments)  ?  Makes mouth itch , ALL melons ?Makes mouth itch , ALL melons  ? Nsaids   ?  Avoid per nephrology  ? ? ?Patient Measurements: ?Height: 5\' 2"  (157.5 cm) ?Weight: 66.7 kg (147 lb 0.8 oz) ?IBW/kg (Calculated) : 50.1 ?Heparin Dosing Weight: 63.8 kg ? ?Vital Signs: ?Temp: 98.9 ?F (37.2 ?C) (03/28 1125) ?Temp Source: Oral (03/28 1125) ?BP: 129/107 (03/28 1645) ?Pulse Rate: 98 (03/28 1645) ? ?Labs: ?Recent Labs  ?  11/22/21 ?1027  ?HGB 8.8*  ?HCT 27.4*  ?PLT 239  ?CREATININE 7.66*  ? ? ?Estimated Creatinine Clearance: 10 mL/min (A) (by C-G formula based on SCr of 7.66 mg/dL (H)). ? ? ?Medical History: ?Past Medical History:  ?Diagnosis Date  ? Anxiety   ? Bipolar 2 disorder ([place])   ? CHF (congestive heart failure) ([place])   ? CKD (chronic kidney disease), stage III ([place])   ? Depression   ? DKA (diabetic ketoacidoses)   ? HSV infection   ? on valtrex  ? Hypokalemia   ? Leukocytosis   ? Migraine   ? Noncompliance with medication regimen   ? Preeclampsia   ? Severe anemia   ? Type 1 diabetes mellitus ([place])   ? ? ?Medications:  ?(Not in a hospital admission)  ?Scheduled:  ?Infusions:  ? ?No anticoagulation PTA. ? ?Assessment: ?Patient with PMH of CHF, T1DM, and AKI on ESRD presented with cough. Patient denies shortness of breath, but O2 sats are 83-85% upon ambulation. BNP is 2727, Scr is 7.66, Hg is 8.8, and HCT is 27.4. CTA today was noted to be uncertain and equivocal for PE and recommended doppler. Will continue to follow doppler results. ? ?Goal of Therapy:  ?Heparin level 0.3-0.7 units/ml ?Monitor platelets by anticoagulation protocol: Yes ?  ?Plan:  ?Give heparin 4000 unit bolus ?Start  heparin at 1050 units/hr ?8 hr heparin level ?Daily heparin level and CBC ?Follow up imaging ? ?Thank you for allowing pharmacy to participate in this patient's care. ? ?Reatha Harps, PharmD ?PGY1 Pharmacy Resident ?11/22/2021 5:01 PM ?Check [email or web address] for unit specific pharmacy number ? ? ? ?

## 2021-11-22 NOTE — Progress Notes (Signed)
Patient with history of type 1 diabetes mellitus, ESRD on nightly PD, CHF, bipolar disorder ?Presented to ED admission at [facility] today with complaint of cough, sore throat for 2 days ?Noted to be tachycardic and hypoxic on ambulation ? ?CTPA - no central or lobar level pulm embolism but patient seem to have chronic embolism and peripheral segmental and subsegmental branches.   ?BNP close to 3000, Reflux of contrast  ?Strep throat negative. ? ?Heparin drip was started. ?Nephrologist - Dr. Marval Regal was called by ED physician. ? ?We will accept her in observation status in medical telemetry. ? ?

## 2021-11-22 NOTE — Progress Notes (Signed)
Patient arrived to the floor alert and oriented and ambulated to the bed. CHG bath given, stand up weight obtained, VSS, cardiac monitoring initiated. IV heparin running. Patient oriented to staff, room and equipment. We'll continue to monitor. ?

## 2021-11-22 NOTE — ED Notes (Signed)
Pt does peritoneal dialysis nightly at appx 2200. Sherita with bed control states there are no beds available, secretary informed nurse that she will need to be transferred as we do not do PD here. Sherita informed this RN that she will be placed on [place] and the dialysis nurse will come later tonight to administer PD. ?

## 2021-11-22 NOTE — ED Notes (Signed)
[facility] at bedside to transport patient, report given. ?

## 2021-11-22 NOTE — ED Notes (Signed)
Called and spoke to [place] in lab to add on BNP to previously drawn labs ?

## 2021-11-22 NOTE — ED Notes (Signed)
Pt states she feels her BG is dropping. BG 62. Provided with juice and peanut butter crackers with provider approval. ?

## 2021-11-22 NOTE — ED Provider Notes (Signed)
?[facility] EMERGENCY DEPARTMENT ?Provider Note ? ? ?CSN: [id number] ?Arrival date & time: 11/22/21  4098 ? ?  ? ?History ?Chief Complaint  ?Patient presents with  ? Cough  ? ? ?Kathy Lewis is a 26 y.o. female who presents to the emergency department with a 2-day history of nonproductive cough, sore throat, and rhinorrhea.  Patient does have a history of pneumonia back in February which was successfully treated with doxycycline.  She states that this feels similar.  She denies any fever or chills.  No abdominal pain, nausea, vomiting, diarrhea.  No ear pain. ? ? ?Cough ? ?  ? ?Home Medications ?Prior to Admission medications   ?Medication Sig Start Date End Date Taking? Authorizing Provider  ?sacubitril-valsartan (ENTRESTO) 24-26 MG Take 1 tablet by mouth 2 (two) times daily.   Yes [provider]  ?albuterol (VENTOLIN HFA) 108 (90 Base) MCG/ACT inhaler Inhale 2 puffs into the lungs every 6 (six) hours as needed for wheezing or shortness of breath.  01/15/20 02/16/21  [provider]  ?atorvastatin (LIPITOR) 40 MG tablet Take 80 mg by mouth daily. 08/03/21   [provider]  ?butalbital-acetaminophen-caffeine (FIORICET) 50-325-40 MG tablet Take 1 tablet by mouth daily as needed for headache. 02/18/21 02/18/22  Dwyane Dee, MD  ?calcitRIOL (ROCALTROL) 0.25 MCG capsule Take 0.25 mcg by mouth daily. 08/03/21   [provider]  ?carvedilol (COREG) 12.5 MG tablet Take 12.5 mg by mouth 2 (two) times daily with a meal.    [provider]  ?doxycycline (VIBRAMYCIN) 100 MG capsule Take 1 capsule (100 mg total) by mouth 2 (two) times daily. 10/16/21   Davonna Belling, MD  ?FEROSUL 325 (65 Fe) MG tablet Take 325 mg by mouth 3 (three) times daily. 04/30/21   [provider]  ?gentamicin cream (GARAMYCIN) 0.1 % Apply 1 application topically daily. Apply to exit site. 08/12/21   Rai, Vernelle Emerald, MD  ?hydrALAZINE (APRESOLINE) 100 MG tablet Take 100 mg by mouth 3 (three)  times daily. 08/05/21   [provider]  ?insulin detemir (LEVEMIR) 100 UNIT/ML injection Inject 0.1 mLs (10 Units total) into the skin 2 (two) times daily. 08/12/21   Rai, Ripudeep K, MD  ?insulin lispro (HUMALOG) 100 UNIT/ML injection Inject 0.02 mLs (2 Units total) into the skin 3 (three) times daily with meals. 08/12/21   Rai, Vernelle Emerald, MD  ?Insulin Syringe-Needle U-100 (INSULIN SYRINGE .3CC/31GX5/16") 31G X 5/16" 0.3 ML MISC Use as directed 10/27/19   [name], MD  ?isosorbide mononitrate (IMDUR) 30 MG 24 hr tablet Take 30 mg by mouth daily.    [provider]  ?losartan (COZAAR) 50 MG tablet Take 50 mg by mouth daily. 08/10/21   [provider]  ?magnesium oxide (MAG-OX) 400 MG tablet Take by mouth. 08/03/21   [provider]  ?medroxyPROGESTERone (DEPO-PROVERA) 150 MG/ML injection Inject 1 mL (150 mg total) into the muscle every 3 (three) months. ?Patient not taking: Reported on 08/11/2021 03/29/21   Gavin Pound, CNM  ?methocarbamol (ROBAXIN) 500 MG tablet Take 500 mg by mouth 4 (four) times daily. 03/09/21   [provider]  ?prochlorperazine (COMPAZINE) 5 MG tablet Take 1 tablet (5 mg total) by mouth every 6 (six) hours as needed for nausea or vomiting. 08/12/21   Rai, Vernelle Emerald, MD  ?torsemide (DEMADEX) 20 MG tablet Take 20-40 mg by mouth See admin instructions. Takes 2 tablets in the morning and 1 tablet at lunch    [provider]  ?Vitamin  D, Ergocalciferol, (DRISDOL) 1.25 MG (50000 UNIT) CAPS capsule Take 50,000 Units by mouth every 7 (seven) days.    [provider]  ?escitalopram (LEXAPRO) 10 MG tablet Take 20 mg by mouth daily.  05/10/20 10/06/20  [provider]  ?furosemide (LASIX) 40 MG tablet Take 1 tablet (40 mg total) by mouth daily. ?Patient not taking: No sig reported 10/28/19 02/17/21  [name], MD  ?lisinopril (ZESTRIL) 10 MG tablet Take 10 mg by mouth daily.  02/23/20 10/06/20  [provider]   ?spironolactone (ALDACTONE) 25 MG tablet Take 25 mg by mouth daily.  04/20/20 10/06/20  [provider]  ?   ? ?Allergies    ?Cantaloupe extract allergy skin test, Strawberry extract, Citrullus vulgaris, and Nsaids   ? ?Review of Systems   ?Review of Systems  ?Respiratory:  Positive for cough.   ?All other systems reviewed and are negative. ? ?Physical Exam ?Updated Vital Signs ?BP (!) 137/110   Pulse (!) 107   Temp 98.9 ?F (37.2 ?C) (Oral)   Resp 15   Ht 5\' 2"  (1.575 m)   Wt 66.7 kg   SpO2 97%   BMI 26.90 kg/m?  ?Physical Exam ?Vitals and nursing note reviewed.  ?Constitutional:   ?   General: She is not in acute distress. ?   Appearance: Normal appearance.  ?HENT:  ?   Head: Normocephalic and atraumatic.  ?   Nose: Congestion present.  ?   Right Turbinates: Swollen.  ?   Left Turbinates: Swollen.  ?   Mouth/Throat:  ?   Comments: Mild posterior pharyngeal erythema.  No obvious tonsillar hypertrophy bilaterally.  Uvula is midline.  No tonsillar exudate.  Talking in complete sentences.  Normal phonation. ?Eyes:  ?   General:     ?   Right eye: No discharge.     ?   Left eye: No discharge.  ?   Conjunctiva/sclera: Conjunctivae normal.  ?Cardiovascular:  ?   Comments: Tachycardic. S1/S2 are distinct without any evidence of murmur, rubs, or gallops.  Radial pulses are 2+ bilaterally.  Dorsalis pedis pulses are 2+ bilaterally.  No evidence of pedal edema. ?Pulmonary:  ?   Effort: Pulmonary effort is normal.  ?   Comments: Clear to auscultation bilaterally.  Normal effort.  No respiratory distress.  No evidence of wheezes, rales, or rhonchi heard throughout. ?Abdominal:  ?   General: Abdomen is flat. Bowel sounds are normal. There is no distension.  ?   Tenderness: There is no abdominal tenderness. There is no guarding or rebound.  ?Musculoskeletal:     ?   General: Normal range of motion.  ?   Cervical back: Neck supple.  ?Skin: ?   General: Skin is warm and dry.  ?   Findings: No rash.  ?Neurological:  ?    General: No focal deficit present.  ?   Mental Status: She is alert.  ?Psychiatric:     ?   Mood and Affect: Mood normal.     ?   Behavior: Behavior normal.  ? ? ?ED Results / Procedures / Treatments   ?Labs ?(all labs ordered are listed, but only abnormal results are displayed) ?Labs Reviewed  ?CBC WITH DIFFERENTIAL/PLATELET - Abnormal; Notable for the following components:  ?    Result Value  ? RBC 2.80 (*)   ? Hemoglobin 8.8 (*)   ? HCT 27.4 (*)   ? All other components within normal limits  ?BASIC METABOLIC PANEL - Abnormal; Notable  for the following components:  ? Glucose, Bld 169 (*)   ? BUN 54 (*)   ? Creatinine, Ser 7.66 (*)   ? Calcium 7.7 (*)   ? GFR, Estimated 7 (*)   ? All other components within normal limits  ?BRAIN NATRIURETIC PEPTIDE - Abnormal; Notable for the following components:  ? B Natriuretic Peptide 2,727.2 (*)   ? All other components within normal limits  ?CBG MONITORING, ED - Abnormal; Notable for the following components:  ? Glucose-Capillary 62 (*)   ? All other components within normal limits  ?RESP PANEL BY RT-PCR (FLU A&B, COVID) [id number]  ?GROUP A STREP BY PCR  ?HEPARIN LEVEL (UNFRACTIONATED)  ? ? ?EKG ?None ? ?Radiology ?DG Chest 2 View ? ?Addendum Date: 11/22/2021   ?ADDENDUM REPORT: 11/22/2021 15:43 ADDENDUM: No skeletal stigmata of sickle cell disease. This was reported in error. Electronically Signed   By: Zetta Bills M.D.   On: 11/22/2021 15:43  ? ?Result Date: 11/22/2021 ?CLINICAL DATA:  A 26 year old female presents with history of cough, pneumonia for 2 days. EXAM: CHEST - 2 VIEW COMPARISON:  Comparison made with October 16, 2021. FINDINGS: Trachea midline. Cardiomediastinal contours and hilar structures are stable with cardiac enlargement. Increased interstitial markings are unchanged. Patchy basilar airspace opacities are suggested. No sign of pneumothorax. No evidence of pleural effusion. H-shaped vertebral bodies in this patient with sickle cell disease. No acute  skeletal findings. IMPRESSION: Cardiomegaly with chronic increased interstitial markings superimposed mild edema is possible. Patchy basilar opacities may represent atelectasis, asymmetric edema or early viral/atypical proce

## 2021-11-22 NOTE — ED Notes (Signed)
Pt BG reading 92 on home device ?

## 2021-11-22 NOTE — ED Triage Notes (Signed)
Presents with cough, onset 2 days ago, also have sore throat. Denies having any fevers. Able to take POs well. States lying down increases coughing.  ?

## 2021-11-22 NOTE — H&P (Addendum)
?History and Physical  ? ?Kathy Lewis WJX:[id number] DOB: August 12, 1996 DOA: 11/22/2021 ? ?PCP: [facility]  ? ?Patient coming from: Home ? ?Chief Complaint: Cough, sore throat, rhinorrhea. ? ?HPI: Kathy Lewis is a 26 y.o. female with medical history significant of type 1 diabetes, gastroparesis, anemia, CHF, postpartum cardiomyopathy, ESRD on peritoneal dialysis, anxiety, hypertension, bipolar, hyperlipidemia presenting with cough and sore throat as well as rhinorrhea. ? ?Patient report 2 days of sore throat and rhinorrhea with nonproductive cough.  Reports she did have pneumonia back in February treated with doxycycline and her symptoms seem similar to that.  Also reports some shortness of breath. ? ?She denies fevers, chills, chest pain, abdominal pain, constipation, diarrhea, nausea, vomiting. ? ?ED Course: Vital signs in the ED significant for pulse in the 90s to 100s, blood pressure in the 213Y systolic, respiratory rate in the teens to 20s, desaturating to the high 80s on room air with ambulation.  Lab work-up included CMP with creatinine of 7.6 consistent with ESRD, BUN 54, glucose 169, calcium 7.7.  CBC with hemoglobin stable at 8.8.  BNP elevated to greater than 2700.  Group A strep negative.  Rester panel for flu and COVID-negative.  Imaging studies show chest x-ray with cardiomegaly and increased interstitial markings as well as patchy opacity at the bases possibly representing atelectasis versus edema versus infection.  CTA study showed no central or lobar PE but did show segmental and subsegmental changes which were equivocal and could represent chronic embolism versus other findings.  Also noted was contrast reflux into the hepatic vein consistent with RV dysfunction and pulmonary edema as well as volume loss of the left base.  Patient received dose of Lasix and Tylenol in the ED.  Also started on heparin drip.  Nephrology was consulted and have placed orders for peritoneal  dialysis, diuretics, iron studies. ? ?Review of Systems: As per HPI otherwise all other systems reviewed and are negative. ? ?Past Medical History:  ?Diagnosis Date  ? Anxiety   ? Bipolar 2 disorder ([employer])   ? CHF (congestive heart failure) ([place])   ? CKD (chronic kidney disease), stage III ([place])   ? Depression   ? DKA (diabetic ketoacidoses)   ? HSV infection   ? on valtrex  ? Hypokalemia   ? Leukocytosis   ? Migraine   ? Noncompliance with medication regimen   ? Preeclampsia   ? Severe anemia   ? Type 1 diabetes mellitus ([place])   ? ? ?Past Surgical History:  ?Procedure Laterality Date  ? CARDIAC CATHETERIZATION    ? DILATION AND EVACUATION N/A 10/22/2019  ? Procedure: ULTRASOUND GUIDED DILATATION AND EVACUATION;  Surgeon: [name], MD;  Location: [facility] LD ORS;  Service: Gynecology;  Laterality: N/A;  ? NO PAST SURGERIES    ? peritoneal dialysis catheter insertion    ? RENAL BIOPSY    ? ? ?Social History ? reports that she has quit smoking. Her smoking use included cigars and cigarettes. She smoked an average of 1 pack per day. She has never used smokeless tobacco. She reports that she does not drink alcohol and does not use drugs. ? ?Allergies  ?Allergen Reactions  ? Cantaloupe Extract Allergy Skin Test Itching  ?  Mouth itching ? ?  ? Strawberry Extract   ?  Other reaction(s): Itching ?Mouth itches ?Mouth itches  ? Citrullus Vulgaris Other (See Comments)  ?  Makes mouth itch , ALL melons ?Makes mouth itch , ALL melons  ? Nsaids   ?  Avoid per nephrology  ? ? ?Family History  ?Adopted: Yes  ?Family history unknown: Yes  ?Reviewed on admission ? ?Prior to Admission medications   ?Medication Sig Start Date End Date Taking? Authorizing Provider  ?sacubitril-valsartan (ENTRESTO) 24-26 MG Take 1 tablet by mouth 2 (two) times daily.   Yes [provider]  ?albuterol (VENTOLIN HFA) 108 (90 Base) MCG/ACT inhaler Inhale 2 puffs into the lungs every 6 (six) hours as needed for wheezing or shortness of breath.   01/15/20 02/16/21  [provider]  ?atorvastatin (LIPITOR) 40 MG tablet Take 80 mg by mouth daily. 08/03/21   [provider]  ?butalbital-acetaminophen-caffeine (FIORICET) 50-325-40 MG tablet Take 1 tablet by mouth daily as needed for headache. 02/18/21 02/18/22  Dwyane Dee, MD  ?calcitRIOL (ROCALTROL) 0.25 MCG capsule Take 0.25 mcg by mouth daily. 08/03/21   [provider]  ?carvedilol (COREG) 12.5 MG tablet Take 12.5 mg by mouth 2 (two) times daily with a meal.    [provider]  ?doxycycline (VIBRAMYCIN) 100 MG capsule Take 1 capsule (100 mg total) by mouth 2 (two) times daily. 10/16/21   Davonna Belling, MD  ?FEROSUL 325 (65 Fe) MG tablet Take 325 mg by mouth 3 (three) times daily. 04/30/21   [provider]  ?gentamicin cream (GARAMYCIN) 0.1 % Apply 1 application topically daily. Apply to exit site. 08/12/21   Rai, Vernelle Emerald, MD  ?hydrALAZINE (APRESOLINE) 100 MG tablet Take 100 mg by mouth 3 (three) times daily. 08/05/21   [provider]  ?insulin detemir (LEVEMIR) 100 UNIT/ML injection Inject 0.1 mLs (10 Units total) into the skin 2 (two) times daily. 08/12/21   Rai, Ripudeep K, MD  ?insulin lispro (HUMALOG) 100 UNIT/ML injection Inject 0.02 mLs (2 Units total) into the skin 3 (three) times daily with meals. 08/12/21   Rai, Vernelle Emerald, MD  ?Insulin Syringe-Needle U-100 (INSULIN SYRINGE .3CC/31GX5/16") 31G X 5/16" 0.3 ML MISC Use as directed 10/27/19   [name], MD  ?isosorbide mononitrate (IMDUR) 30 MG 24 hr tablet Take 30 mg by mouth daily.    [provider]  ?losartan (COZAAR) 50 MG tablet Take 50 mg by mouth daily. 08/10/21   [provider]  ?magnesium oxide (MAG-OX) 400 MG tablet Take by mouth. 08/03/21   [provider]  ?medroxyPROGESTERone (DEPO-PROVERA) 150 MG/ML injection Inject 1 mL (150 mg total) into the muscle every 3 (three) months. ?Patient not taking: Reported on 08/11/2021 03/29/21   Gavin Pound, CNM   ?methocarbamol (ROBAXIN) 500 MG tablet Take 500 mg by mouth 4 (four) times daily. 03/09/21   [provider]  ?prochlorperazine (COMPAZINE) 5 MG tablet Take 1 tablet (5 mg total) by mouth every 6 (six) hours as needed for nausea or vomiting. 08/12/21   Rai, Vernelle Emerald, MD  ?torsemide (DEMADEX) 20 MG tablet Take 20-40 mg by mouth See admin instructions. Takes 2 tablets in the morning and 1 tablet at lunch    [provider]  ?Vitamin D, Ergocalciferol, (DRISDOL) 1.25 MG (50000 UNIT) CAPS capsule Take 50,000 Units by mouth every 7 (seven) days.    [provider]  ?escitalopram (LEXAPRO) 10 MG tablet Take 20 mg by mouth daily.  05/10/20 10/06/20  [provider]  ?furosemide (LASIX) 40 MG tablet Take 1 tablet (40 mg total) by mouth daily. ?Patient not taking: No sig reported 10/28/19 02/17/21  [name], MD  ?lisinopril (ZESTRIL) 10 MG tablet Take 10 mg by mouth daily.  02/23/20 10/06/20  [provider]  ?spironolactone (ALDACTONE) 25 MG tablet Take 25 mg by mouth daily.  04/20/20 10/06/20  [provider]  ? ? ?Physical Exam: ?Vitals:  ? 11/22/21 1815 11/22/21 1845 11/22/21 2036 11/22/21 2157  ?BP: (!) 148/117 (!) 137/110  (!) 146/116  ?Pulse: (!) 106 (!) 107  (!) 114  ?Resp: (!) 23 15  20   ?Temp:    100.1 ?F (37.8 ?C)  ?TempSrc:    Oral  ?SpO2: 96% 97%  96%  ?Weight:   66 kg   ?Height:   5\' 2"  (1.575 m)   ? ? ?Physical Exam ?Constitutional:   ?   General: She is not in acute distress. ?   Appearance: Normal appearance.  ?HENT:  ?   Head: Normocephalic and atraumatic.  ?   Mouth/Throat:  ?   Mouth: Mucous membranes are moist.  ?   Pharynx: Oropharynx is clear.  ?Eyes:  ?   Extraocular Movements: Extraocular movements intact.  ?   Pupils: Pupils are equal, round, and reactive to light.  ?Cardiovascular:  ?   Rate and Rhythm: Regular rhythm. Tachycardia present.  ?   Pulses: Normal pulses.  ?   Heart sounds: Normal heart sounds.  ?Pulmonary:  ?   Effort: Pulmonary effort  is normal. No respiratory distress.  ?   Breath sounds: Rales present.  ?Abdominal:  ?   General: Bowel sounds are normal. There is no distension.  ?   Palpations: Abdomen is soft.  ?   Tenderness: There is

## 2021-11-22 NOTE — ED Notes (Addendum)
SpO2  dropped to 83-85% upon ambulation, pt denies feeling short of breath while ambulating. HR maintained between 105-110. Kae Heller, [place] notified. ?

## 2021-11-23 ENCOUNTER — Observation Stay (HOSPITAL_COMMUNITY): Payer: Medicaid Other

## 2021-11-23 ENCOUNTER — Telehealth: Payer: Self-pay | Admitting: Internal Medicine

## 2021-11-23 ENCOUNTER — Observation Stay (HOSPITAL_BASED_OUTPATIENT_CLINIC_OR_DEPARTMENT_OTHER): Payer: Medicaid Other

## 2021-11-23 ENCOUNTER — Other Ambulatory Visit (HOSPITAL_COMMUNITY): Payer: Self-pay

## 2021-11-23 DIAGNOSIS — I5023 Acute on chronic systolic (congestive) heart failure: Secondary | ICD-10-CM | POA: Diagnosis not present

## 2021-11-23 DIAGNOSIS — J069 Acute upper respiratory infection, unspecified: Secondary | ICD-10-CM

## 2021-11-23 DIAGNOSIS — M7989 Other specified soft tissue disorders: Secondary | ICD-10-CM | POA: Diagnosis not present

## 2021-11-23 DIAGNOSIS — N186 End stage renal disease: Secondary | ICD-10-CM

## 2021-11-23 DIAGNOSIS — R9389 Abnormal findings on diagnostic imaging of other specified body structures: Secondary | ICD-10-CM

## 2021-11-23 DIAGNOSIS — J9601 Acute respiratory failure with hypoxia: Secondary | ICD-10-CM | POA: Diagnosis not present

## 2021-11-23 LAB — ECHOCARDIOGRAM COMPLETE
AR max vel: 2.65 cm2
AV Peak grad: 5.3 mmHg
Ao pk vel: 1.15 m/s
Area-P 1/2: 5.46 cm2
Calc EF: 28.8 %
Height: 62 in
S' Lateral: 4.6 cm
Single Plane A2C EF: 28.2 %
Single Plane A4C EF: 28.6 %
Weight: 2328 oz

## 2021-11-23 LAB — HEMOGLOBIN A1C
Hgb A1c MFr Bld: 9.2 % — ABNORMAL HIGH (ref 4.8–5.6)
Mean Plasma Glucose: 217.34 mg/dL

## 2021-11-23 LAB — RENAL FUNCTION PANEL
Albumin: 2.2 g/dL — ABNORMAL LOW (ref 3.5–5.0)
Anion gap: 10 (ref 5–15)
BUN: 52 mg/dL — ABNORMAL HIGH (ref 6–20)
CO2: 21 mmol/L — ABNORMAL LOW (ref 22–32)
Calcium: 8 mg/dL — ABNORMAL LOW (ref 8.9–10.3)
Chloride: 105 mmol/L (ref 98–111)
Creatinine, Ser: 7.87 mg/dL — ABNORMAL HIGH (ref 0.44–1.00)
GFR, Estimated: 7 mL/min — ABNORMAL LOW (ref >60)
Glucose, Bld: 98 mg/dL (ref 70–99)
Phosphorus: 6.1 mg/dL — ABNORMAL HIGH (ref 2.5–4.6)
Potassium: 4.7 mmol/L (ref 3.5–5.1)
Sodium: 136 mmol/L (ref 135–145)

## 2021-11-23 LAB — GLUCOSE, CAPILLARY
Glucose-Capillary: 132 mg/dL — ABNORMAL HIGH (ref 70–99)
Glucose-Capillary: 210 mg/dL — ABNORMAL HIGH (ref 70–99)
Glucose-Capillary: 168 mg/dL — ABNORMAL HIGH (ref 70–99)
Glucose-Capillary: 184 mg/dL — ABNORMAL HIGH (ref 70–99)
Glucose-Capillary: 129 mg/dL — ABNORMAL HIGH (ref 70–99)

## 2021-11-23 LAB — CBC
HCT: 28 % — ABNORMAL LOW (ref 36.0–46.0)
Hemoglobin: 9 g/dL — ABNORMAL LOW (ref 12.0–15.0)
MCH: 31.4 pg (ref 26.0–34.0)
MCHC: 32.1 g/dL (ref 30.0–36.0)
MCV: 97.6 fL (ref 80.0–100.0)
Platelets: 225 10*3/uL (ref 150–400)
RBC: 2.87 MIL/uL — ABNORMAL LOW (ref 3.87–5.11)
RDW: 12.9 % (ref 11.5–15.5)
WBC: 6.2 10*3/uL (ref 4.0–10.5)
nRBC: 0 % (ref 0.0–0.2)

## 2021-11-23 LAB — IRON AND TIBC
Iron: 30 ug/dL (ref 28–170)
Saturation Ratios: 14 % (ref 10.4–31.8)
TIBC: 209 ug/dL — ABNORMAL LOW (ref 250–450)
UIBC: 179 ug/dL

## 2021-11-23 LAB — FERRITIN: Ferritin: 372 ng/mL — ABNORMAL HIGH (ref 11–307)

## 2021-11-23 MED ORDER — TECHNETIUM TO 99M ALBUMIN AGGREGATED
3.9000 | Freq: Once | INTRAVENOUS | Status: AC | PRN
  Administered 2021-11-23: 3.9 via INTRAVENOUS

## 2021-11-23 MED ORDER — INSULIN DETEMIR 100 UNIT/ML ~~LOC~~ SOLN
6.0000 [IU] | Freq: Every day | SUBCUTANEOUS | Status: DC
  Administered 2021-11-23: 6 [IU] via SUBCUTANEOUS
  Filled 2021-11-23 (×2): qty 0.06

## 2021-11-23 MED ORDER — INSULIN ASPART 100 UNIT/ML IJ SOLN
0.0000 [IU] | Freq: Three times a day (TID) | INTRAMUSCULAR | Status: DC
  Administered 2021-11-23: 2 [IU] via SUBCUTANEOUS
  Administered 2021-11-24: 3 [IU] via SUBCUTANEOUS

## 2021-11-23 MED ORDER — INSULIN ASPART 100 UNIT/ML IJ SOLN
4.0000 [IU] | Freq: Three times a day (TID) | INTRAMUSCULAR | Status: DC
  Administered 2021-11-24: 4 [IU] via SUBCUTANEOUS

## 2021-11-23 MED ORDER — INSULIN ASPART 100 UNIT/ML IJ SOLN: 0.0000 [IU] | Freq: Every day | INTRAMUSCULAR | Status: DC

## 2021-11-23 MED ORDER — INSULIN ASPART 100 UNIT/ML IJ SOLN: 0.0000 [IU] | Freq: Three times a day (TID) | INTRAMUSCULAR | Status: DC

## 2021-11-23 NOTE — Assessment & Plan Note (Signed)
--   Appears resolved with dialysis, appears euvolemic.  Care everywhere records reviewed, LVEF and moderate right ventricular systolic function appears to be at baseline. ?-- Continue dialysis, diuretic as per nephrology.  Continue beta-blocker and Entresto. ?

## 2021-11-23 NOTE — Assessment & Plan Note (Signed)
--   Equivocal for peripheral PE.  Discussed with pulmonology, recommendation for VQ scan.  MRI is highly suggestive, will discontinue anticoagulation and proceed with outpatient pulmonology evaluation with Dr. Silas Flood. ?

## 2021-11-23 NOTE — Progress Notes (Signed)
Lower extremity venous bilateral study completed.   Please see CV Proc for preliminary results.   Thania Woodlief, RDMS, RVT  

## 2021-11-23 NOTE — Progress Notes (Signed)
Mobility Specialist Progress Note ? ? 11/23/21 1212  ?Mobility  ?Activity Ambulated independently in hallway  ?Level of Assistance Independent  ?Assistive Device None  ?Distance Ambulated (ft) 470 ft  ?Activity Response Tolerated well  ?$Mobility charge 1 Mobility  ? ?Pre Mobility: 82 HR ?During Mobility: 122 HR ?Post Mobility: 80 HR, 115/83 BP, SpO2 ? ?Pt received in bed and agreeable to mobility. C/o missing dialysis appt last night and would like further explanation as to why. Asymptomatic during mobility but upon returning back to room pt's HR was in the 120's showing signs of Vtach. Performed x10 reps of pursed lip breathing and pt's HR slowly declined. Pt back to bed still having no complaints, placed call bell in reach and notified RN.  ? ? ?Holland Falling ?Mobility Specialist ?Phone Number [phone] ? ?

## 2021-11-23 NOTE — Progress Notes (Signed)
Heart Failure Navigator Progress Note ? ?Assessed for Heart & Vascular TOC clinic readiness.  ?Patient does not meet criteria due to ESRD on peritoneal dialysis.  ? ? ? ?Earnestine Leys, BSN, RN ?Heart Failure Nurse Navigator ?[phone]   ?

## 2021-11-23 NOTE — Assessment & Plan Note (Signed)
--   Resolved.  Etiology unclear.  May have been spurious. ?

## 2021-11-23 NOTE — Discharge Instructions (Addendum)
Information on my medicine - ELIQUIS? (apixaban) ? ?This medication education was reviewed with me or my healthcare representative as part of my discharge preparation.  The pharmacist that spoke with me during my hospital stay was:  ?Georgina Peer, [facility] ? ?Why was Eliquis? prescribed for you? ?Eliquis? was prescribed to treat blood clots that may have been found in the veins of your legs (deep vein thrombosis) or in your lungs (pulmonary embolism) and to reduce the risk of them occurring again. ? ?What do You need to know about Eliquis? ? ?The starting dose is 10 mg (two 5 mg tablets) taken TWICE daily for the FIRST SEVEN (7) DAYS, then on 11/29/21  the dose is reduced to ONE 5 mg tablet taken TWICE daily.  Eliquis? may be taken with or without food.  ? ?Try to take the dose about the same time in the morning and in the evening. If you have difficulty swallowing the tablet whole please discuss with your pharmacist how to take the medication safely. ? ?Take Eliquis? exactly as prescribed and DO NOT stop taking Eliquis? without talking to the doctor who prescribed the medication.  Stopping may increase your risk of developing a new blood clot.  Refill your prescription before you run out. ? ?After discharge, you should have regular check-up appointments with your healthcare provider that is prescribing your Eliquis?. ?   ?What do you do if you miss a dose? ?If a dose of ELIQUIS? is not taken at the scheduled time, take it as soon as possible on the same day and twice-daily administration should be resumed. The dose should not be doubled to make up for a missed dose. ? ?Important Safety Information ?A possible side effect of Eliquis? is bleeding. You should call your healthcare provider right away if you experience any of the following: ?Bleeding from an injury or your nose that does not stop. ?Unusual colored urine (red or dark brown) or unusual colored stools (red or black). ?Unusual bruising for unknown  reasons. ?A serious fall or if you hit your head (even if there is no bleeding). ? ?Some medicines may interact with Eliquis? and might increase your risk of bleeding or clotting while on Eliquis?Marland Kitchen To help avoid this, consult your healthcare provider or pharmacist prior to using any new prescription or non-prescription medications, including herbals, vitamins, non-steroidal anti-inflammatory drugs (NSAIDs) and supplements. ? ?This website has more information on Eliquis? (apixaban): http://www.eliquis.com/eliquis/home  ?

## 2021-11-23 NOTE — TOC Benefit Eligibility Note (Signed)
Patient Advocate Encounter ? ?Insurance verification completed.   ? ?The patient is currently admitted and upon discharge could be taking Eliquis 5 mg. ? ?The current 30 day co-pay is, $4.00.  ? ?The patient is insured through [employer] [place]  ? ? ? ?Lyndel Safe, CPhT ?Pharmacy Patient Advocate Specialist ?[facility] Patient Advocate Team ?Direct Number: [phone]  Fax: [phone] ? ? ? ? ? ?  ?

## 2021-11-23 NOTE — Hospital Course (Addendum)
78LFY complicated PMH including ESRD on PD, postpartum cardiomyopathy 2022 w/ LVEF 30%, moderate RV dysfunction, reported pneumonia February tx'd w/ doxycyline, presented w/ URI symptoms, transient hypoxia. CTA chest equivocal for peripheral PE, prelim LE dopplers negative for DVT.  Improved following day, off oxygen.  Mild dry cough.  Discussed with pulmonary, plan for VQ scan and outpatient follow-up with pulmonology.  Unless VQ scan is high probability, no anticoagulation. ?

## 2021-11-23 NOTE — Progress Notes (Signed)
Admit: 11/22/2021 ?LOS: 0 ? ?64F ESRD on PD admit with SOB/hypoxia ? ?Subjective:  ?Did not cycle overnight ?This AM on RA, breathing comfortably says no sig dyspnea; has not ambulated ?Denies F/C, abd pain, cloudy effluent ? ?No intake/output data recorded. ? ?Filed Weights  ? 11/22/21 9326 11/22/21 2036  ?Weight: 66.7 kg 66 kg  ? ? ?Scheduled Meds: ? apixaban  10 mg Oral BID  ? Followed by  ? [START ON 11/29/2021] apixaban  5 mg Oral BID  ? atorvastatin  80 mg Oral Daily  ? carvedilol  6.25 mg Oral BID WC  ? ferrous sulfate  325 mg Oral Q breakfast  ? gentamicin cream  1 application. Topical Daily  ? hydrALAZINE  100 mg Oral TID  ? insulin pump   Subcutaneous TID WC, HS, 0200  ? isosorbide mononitrate  30 mg Oral Daily  ? sacubitril-valsartan  1 tablet Oral BID  ? sodium chloride flush  3 mL Intravenous Q12H  ? torsemide  40 mg Oral BID  ? ?Continuous Infusions: ? dialysis solution 4.25% low-MG/low-CA    ? ?PRN Meds:.acetaminophen **OR** acetaminophen, butalbital-acetaminophen-caffeine, guaiFENesin-dextromethorphan, methocarbamol, polyethylene glycol ? ?Current Labs: reviewed  ? ? ?Physical Exam:  Blood pressure (!) 122/94, pulse 99, temperature 98.2 ?F (36.8 ?C), temperature source Oral, resp. rate 18, height 5\' 2"  (1.575 m), weight 66 kg, SpO2 93 %. ?RRR ?CTAB, nl WOB, no crackles/wheeizing ?S/nt/nd; exit site not examined ?No LEE ?Nonfocal, CN2-12 intact ?AAO x3 ? ?Dialysis Orders: [facility] ([place], Dr. Olivia Mackie) 4 exchanges, 1.5L fill volume, dwell time 1:55, fill time 10 minutes, drain time 20 minutes, unclear EDW ? ?A ?ESRD On PD ?SOB/hypoxia, on RA at this time with minimal Sx.  CTA suggested some pulm edema, possible small PE.   ?HTN/Vol: BPs stable on RA, as above ?Anemia, Hb stable 9.0 outpt regimen unclear ?CKD-BMD: Ca ok, P 6.1 no binders on home med list, outpt mgmt. Cont C3 ?HFrEF NICM Post Partum CM ?DM1 ? ?P ?If ambulates w/o desat prob ok to go home and do PD with 4.25% dextrose all bags and work  with her outpt PD team.  If hypoxic, PD tonight here all 4.25% ?Cont torsemide ? ?Medication Issues; ?Preferred narcotic agents for pain control are hydromorphone, fentanyl, and methadone. Morphine should not be used.  ?Baclofen should be avoided ?Avoid oral sodium phosphate and magnesium citrate based laxatives / bowel preps  ? ? ?Pearson Grippe MD ?11/23/2021, 11:08 AM ? ?Recent Labs  ?Lab 11/22/21 ?1027 11/23/21 ?0145  ?NA 138 136  ?K 4.4 4.7  ?CL 109 105  ?CO2 22 21*  ?GLUCOSE 169* 98  ?BUN 54* 52*  ?CREATININE 7.66* 7.87*  ?CALCIUM 7.7* 8.0*  ?PHOS  --  6.1*  ? ?Recent Labs  ?Lab 11/22/21 ?1027 11/23/21 ?0145  ?WBC 6.0 6.2  ?NEUTROABS 4.8  --   ?HGB 8.8* 9.0*  ?HCT 27.4* 28.0*  ?MCV 97.9 97.6  ?PLT 239 225  ? ? ? ? ? ? ? ? ? ?  ?

## 2021-11-23 NOTE — Progress Notes (Signed)
Mobility Specialist Progress Note ? ? 11/23/21 1826  ?Mobility  ?Activity Ambulated independently in hallway  ?Level of Assistance Independent  ?Assistive Device None  ?Distance Ambulated (ft) 360 ft  ?Activity Response Tolerated well  ?$Mobility charge 1 Mobility  ? ?Received pt in bed having no complaints and agreeable to mobility. Pt c/o of BLE fatigue towards end of ambulation but able returned back to EOB w/o a break. Left w/ call bell in reach and all needs met. ? ?Holland Falling ?Mobility Specialist ?Phone Number [phone] ? ?

## 2021-11-23 NOTE — Progress Notes (Signed)
Inpatient Diabetes Program Recommendations ? ?AACE/ADA: New Consensus Statement on Inpatient Glycemic Control (2015) ? ?Target Ranges:  Prepandial:   less than 140 mg/dL ?     Peak postprandial:   less than 180 mg/dL (1-2 hours) ?     Critically ill patients:  140 - 180 mg/dL  ? ?Lab Results  ?Component Value Date  ? GLUCAP 210 (H) 11/23/2021  ? HGBA1C 8.6 (H) 02/16/2021  ? ? ?Review of Glycemic Control ? ?Diabetes history: T1DM ?Outpatient Diabetes medications: insulin pump ?Current orders for Inpatient glycemic control: insulin pump - OmniPod ? ?Endo - Dr Suzette Battiest ?HgbA1C per pt - 9% ?Has had pump approx 1 week ? ?Inpatient Diabetes Program Recommendations:   ? ?Pt managing insulin pump. Will have pt sign contract. ? ?Pump settings: ? ?Basal - 12 units (0.5/H) ?Bolus - CHO ratio - 1:8 ?CF - 50 with target of 130 ? ?Has had a couple of low blood sugars since on pump x 1 week. States blood sugars are better controlled on pump. Discussed hypo s/s and treatment.  ? ?Pt had no other questions.  ? ?Thank you. ?Lorenda Peck, RD, LDN, CDE ?Inpatient Diabetes Coordinator ?[phone]  ? ? ? ? ?

## 2021-11-23 NOTE — Assessment & Plan Note (Signed)
--   Continue peritoneal dialysis per nephrology. ?

## 2021-11-23 NOTE — Progress Notes (Signed)
11/23/2021 ?1215 ?Paged Diabetes coordinator to let them know pt's omnipod for her insulin is out of power and pt does not have a charger.  Pt is to ask family to bring it to her, if unable to the pump will have to stop and will do sliding scale insulin.  Diabetes coordinator will follow up. ?Brion Aliment C ? ?

## 2021-11-23 NOTE — Progress Notes (Signed)
?  Progress Note ? ? ?Patient: Kathy Lewis SWN:[id number] DOB: [birth year] DOA: 11/22/2021     0 ?DOS: the patient was seen and examined on 11/23/2021 ?  ?Brief hospital course: ?89GHW complicated PMH including ESRD on PD, postpartum cardiomyopathy 2022 w/ LVEF 30%, moderate RV dysfunction, reported pneumonia February tx'd w/ doxycyline, presented w/ URI symptoms, transient hypoxia. CTA chest equivocal for peripheral PE, prelim LE dopplers negative for DVT.  Improved following day, off oxygen.  Mild dry cough.  Discussed with pulmonary, plan for VQ scan and outpatient follow-up with pulmonology.  Unless VQ scan is high probability, no anticoagulation. ? ?Assessment and Plan: ?* Acute respiratory failure with hypoxia ([place]) ?-- Resolved.  Etiology unclear.  May have been spurious. ? ?Acute on chronic systolic CHF (congestive heart failure) ([place]) ?-- Appears resolved with dialysis, appears euvolemic.  Care everywhere records reviewed, LVEF and moderate right ventricular systolic function appears to be at baseline. ?-- Continue dialysis, diuretic as per nephrology.  Continue beta-blocker and Entresto. ? ?Abnormal CT of the chest ?-- Equivocal for peripheral PE.  Discussed with pulmonology, recommendation for VQ scan.  MRI is highly suggestive, will discontinue anticoagulation and proceed with outpatient pulmonology evaluation with Dr. Silas Flood. ? ?ESRD (end stage renal disease) ([name]) ?-- Continue peritoneal dialysis per nephrology. ? ?Upper respiratory infection ?-- Mild in nature.  No leukocytosis.  No fever.  No signs of severe infection.  No reason to suspect bacterial infection at this point. ? ? ? ? ?  ? ?Subjective:  ?Feels better, still has dry cough ?No pain ?Breathing fine ?Up to bathroom w/o problem ? ?Physical Exam: ?Vitals:  ? 11/22/21 2157 11/22/21 2356 11/23/21 0335 11/23/21 1520  ?BP: (!) 146/116 (!) 137/112 (!) 122/94 118/83  ?Pulse: (!) 114 (!) 105 99 80  ?Resp: 20 (!) 21 18 18   ?Temp: 100.1 ?F (37.8 ?C)  99.1 ?F (37.3 ?C) 98.2 ?F (36.8 ?C) 98.3 ?F (36.8 ?C)  ?TempSrc: Oral Oral Oral Oral  ?SpO2: 96% 98% 93% 96%  ?Weight:      ?Height:      ? ?Physical Exam ?Vitals reviewed.  ?Constitutional:   ?   General: She is not in acute distress. ?   Appearance: She is not ill-appearing or toxic-appearing.  ?Cardiovascular:  ?   Rate and Rhythm: Normal rate and regular rhythm.  ?   Heart sounds: No murmur heard. ?   Comments: Telemetry SR ?Pulmonary:  ?   Effort: Pulmonary effort is normal. No respiratory distress.  ?   Breath sounds: No wheezing, rhonchi or rales.  ?Abdominal:  ?   Palpations: Abdomen is soft.  ?Musculoskeletal:  ?   Right lower leg: No edema.  ?   Left lower leg: No edema.  ?Neurological:  ?   Mental Status: She is alert.  ?Psychiatric:     ?   Mood and Affect: Mood normal.     ?   Behavior: Behavior normal.  ? ? ?Data Reviewed: ? ?Hgb stable 9.0 ? ?Family Communication: none ? ?Disposition: ?Status is: Observation ? ? Planned Discharge Destination: Home ? ? ? ?Time spent: 45 minutes ? ?Author: ?Murray Hodgkins, MD ?11/23/2021 3:40 PM ? ?For on call review [email or web address].  ?

## 2021-11-23 NOTE — Telephone Encounter (Signed)
New appt for persistent left infiltrate and help completing pulmonary THN workup that appears to be mostly group 2. ? ?2-3 weeks with MD ?

## 2021-11-23 NOTE — Assessment & Plan Note (Signed)
--   Mild in nature.  No leukocytosis.  No fever.  No signs of severe infection.  No reason to suspect bacterial infection at this point. ?

## 2021-11-23 NOTE — Progress Notes (Signed)
Inpatient Diabetes Program Recommendations ? ?AACE/ADA: New Consensus Statement on Inpatient Glycemic Control (2015) ? ?Target Ranges:  Prepandial:   less than 140 mg/dL ?     Peak postprandial:   less than 180 mg/dL (1-2 hours) ?     Critically ill patients:  140 - 180 mg/dL  ? ?Lab Results  ?Component Value Date  ? GLUCAP 168 (H) 11/23/2021  ? HGBA1C 8.6 (H) 02/16/2021  ? ? ?Review of Glycemic Control ? ?Pt will need basal/bolus insulin d/t no charger for pump is available. ? ?Pump settings: ?  ?Basal - 12 units (0.5/H) ?Bolus - CHO ratio - 1:8 ?CF - 50 with target of 130 ? ?Inpatient Diabetes Program Recommendations:   ? ?Levemir 6 units BID - begin 3/29 @ 2200 ? ?Novolog 4 units TID with meals if eating > 50% (meal coverage) ? ?Novolog 0-9 units TID with meals and 0-5 HS (correction) ? ?Will follow-up on 3/30. ? ?Thank you. ?Lorenda Peck, RD, LDN, CDE ?Inpatient Diabetes Coordinator ?[phone]  ? ? ? ? ? ? ? ? ? ? ? ? ? ? ? ? ?

## 2021-11-23 NOTE — Progress Notes (Signed)
Patient's insulin pump battery is out of charge and patient does not have her charger with her.  Patient unable to have friend/family member bring her charger to the hospital.   Tried to see if nurse tech's pump charger would work for the patient but it would not charge patient's insulin pump. ? ?Diabetes coordinator aware and recommendations noted.   Sent message to MD Sarajane Jews to inform him that patient unable to self administer insulin at this time and requested review for orders. ? ?

## 2021-11-24 DIAGNOSIS — N186 End stage renal disease: Secondary | ICD-10-CM

## 2021-11-24 DIAGNOSIS — I5023 Acute on chronic systolic (congestive) heart failure: Secondary | ICD-10-CM | POA: Diagnosis not present

## 2021-11-24 DIAGNOSIS — J9601 Acute respiratory failure with hypoxia: Secondary | ICD-10-CM | POA: Diagnosis not present

## 2021-11-24 LAB — GLUCOSE, CAPILLARY: Glucose-Capillary: 209 mg/dL — ABNORMAL HIGH (ref 70–99)

## 2021-11-24 LAB — HEPATITIS B SURFACE ANTIGEN: Hepatitis B Surface Ag: NONREACTIVE

## 2021-11-24 NOTE — Plan of Care (Signed)
?  Problem: Education: ?Goal: Ability to demonstrate management of disease process will improve ?Outcome: Adequate for Discharge ?Goal: Ability to verbalize understanding of medication therapies will improve ?Outcome: Adequate for Discharge ?Goal: Individualized Educational Video(s) ?Outcome: Adequate for Discharge ?  ?Problem: Activity: ?Goal: Capacity to carry out activities will improve ?Outcome: Adequate for Discharge ?  ?Problem: Cardiac: ?Goal: Ability to achieve and maintain adequate cardiopulmonary perfusion will improve ?Outcome: Adequate for Discharge ?  ?Problem: Metabolic: ?Goal: Ability to maintain appropriate glucose levels will improve ?Outcome: Adequate for Discharge ?  ?Problem: Fluid Volume: ?Goal: Compliance with measures to maintain balanced fluid volume will improve ?Outcome: Adequate for Discharge ?  ?Problem: Health Behavior/Discharge Planning: ?Goal: Ability to manage health-related needs will improve ?Outcome: Adequate for Discharge ?  ?Problem: Nutritional: ?Goal: Ability to make healthy dietary choices will improve ?Outcome: Adequate for Discharge ?  ?Problem: Clinical Measurements: ?Goal: Complications related to the disease process, condition or treatment will be avoided or minimized ?Outcome: Adequate for Discharge ?  ?Problem: Education: ?Goal: Knowledge of General Education information will improve ?Description: Including pain rating scale, medication(s)/side effects and non-pharmacologic comfort measures ?Outcome: Adequate for Discharge ?  ?Problem: Pain Managment: ?Goal: General experience of comfort will improve ?Outcome: Adequate for Discharge ?  ?Problem: Safety: ?Goal: Ability to remain free from injury will improve ?Outcome: Adequate for Discharge ?  ?Problem: Skin Integrity: ?Goal: Risk for impaired skin integrity will decrease ?Outcome: Adequate for Discharge ?  ?

## 2021-11-24 NOTE — Discharge Summary (Signed)
?Physician Discharge Summary ?  ?Patient: Kathy Lewis MRN: [id number] DOB: 1995-09-20  ?Admit date:     11/22/2021  ?Discharge date: 11/24/21  ?Discharge Physician: Murray Hodgkins  ? ?PCP: [facility]  ? ?Recommendations at discharge:  ? ? Abnormal left lung, see below ? ?Discharge Diagnoses: ?Principal Problem: ?  Acute respiratory failure with hypoxia ([place]) ?Active Problems: ?  Acute on chronic systolic CHF (congestive heart failure) ([place]) ?  Abnormal CT of the chest ?  ESRD (end stage renal disease) ([place]) ?  Upper respiratory infection ? ?Resolved Problems: ?  * No resolved hospital problems. * ? 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 CTA chest equivocal for peripheral PE, prelim LE dopplers negative for DVT.  Improved following day, off oxygen.  Mild dry cough.  Discussed with pulmonary, VQ scan ordered and nondiagnostic, again showing abnormal left lung. In discussion with pulmonology, pt will follow-up with Dr. Silas Flood as an outpatient.    ? ?* Acute respiratory failure with hypoxia ([place]) ?-- Resolved. Transient, secondary to CHF. ? ?Acute on chronic systolic CHF (congestive heart failure) ([place]) ?-- Resolved with dialysis, now appears euvolemic. 4L off with dialysis. Care everywhere records reviewed, LVEF and moderate right ventricular systolic function appears to be at baseline. ?-- Continue dialysis, diuretic as per nephrology (Torsemide 80mg  daily).  Continue beta-blocker and Entresto. ? ?Abnormal CT of the chest ?-- Equivocal for peripheral PE. V/Q nondiagnostic. Discussed with pulmonology -- no anticoagulation. Will proceed with outpatient pulmonology evaluation with Dr. Silas Flood. ? ?ESRD (end stage renal disease) ([place]) ?-- Continue peritoneal dialysis per nephrology. ? ?Upper respiratory  infection ?-- Mild in nature.  No leukocytosis.  No fever.  No signs of severe infection.  No reason to suspect bacterial infection at this point. ? ?  ? ?Consultants:  ?Nephrology ? ?Procedures performed: none ?  ?Disposition: Home ?Diet recommendation:  ?Discharge Diet Orders (From admission, onward)  ? ?  Start     Ordered  ? 11/24/21 0000  Diet - low sodium heart healthy       ? 11/24/21 9629  ? ?  ?  ? ?  ? ?Renal diet ? ?DISCHARGE MEDICATION: ?Continue insulin pump ?Continue Torsemide as per nephrology  ?Allergies as of 11/24/2021   ? ?   Reactions  ? Cantaloupe Extract Allergy Skin Test Itching  ? Mouth itching  ? Strawberry Extract Itching  ? Mouth itches  ? Citrullus Vulgaris Itching  ? Makes mouth itch , ALL melons  ? Nsaids Other (See Comments)  ? Avoid per nephrology  ? ?  ? ?  ?Medication List  ?  ? ?STOP taking these medications   ? ?doxycycline 100 MG capsule ?Commonly known as: VIBRAMYCIN ?  ?insulin detemir 100 UNIT/ML injection ?Commonly known as: LEVEMIR ?  ?insulin lispro 100 UNIT/ML injection ?Commonly known as: HUMALOG ?  ? ?  ? ?TAKE these medications   ? ?atorvastatin 40 MG tablet ?Commonly known as: LIPITOR ?Take 80 mg by mouth daily. ?  ?butalbital-acetaminophen-caffeine 50-325-40 MG tablet ?Commonly known as: FIORICET ?Take 1 tablet by mouth daily as needed for headache. ?  ?calcitRIOL 0.25 MCG capsule ?Commonly known as: ROCALTROL ?Take 0.25 mcg by mouth daily. ?  ?carvedilol 12.5 MG tablet ?Commonly known as: COREG ?Take 6.125 mg by mouth 2 (two) times daily with a meal. ?  ?Entresto 24-26  MG ?Generic drug: sacubitril-valsartan ?Take 1 tablet by mouth 2 (two) times daily. ?  ?FeroSul 325 (65 FE) MG tablet ?Generic drug: ferrous sulfate ?Take 325 mg by mouth 3 (three) times daily. ?  ?gentamicin cream 0.1 % ?Commonly known as: GARAMYCIN ?Apply 1 application topically daily. Apply to exit site. ?  ?hydrALAZINE 100 MG tablet ?Commonly known as: APRESOLINE ?Take 100 mg by mouth 3 (three)  times daily. ?  ?INSULIN SYRINGE .3CC/31GX5/16" 31G X 5/16" 0.3 ML Misc ?Use as directed ?  ?isosorbide mononitrate 30 MG 24 hr tablet ?Commonly known as: IMDUR ?Take 30 mg by mouth daily. ?  ?magnesium oxide 400 MG tablet ?Commonly known as: MAG-OX ?Take 800 mg by mouth daily. ?  ?medroxyPROGESTERone 150 MG/ML injection ?Commonly known as: DEPO-PROVERA ?Inject 1 mL (150 mg total) into the muscle every 3 (three) months. ?  ?methocarbamol 500 MG tablet ?Commonly known as: ROBAXIN ?Take 500 mg by mouth 4 (four) times daily as needed for muscle spasms. ?  ?prochlorperazine 5 MG tablet ?Commonly known as: COMPAZINE ?Take 1 tablet (5 mg total) by mouth every 6 (six) hours as needed for nausea or vomiting. ?  ?Vitamin D (Ergocalciferol) 1.25 MG (50000 UNIT) Caps capsule ?Commonly known as: DRISDOL ?Take 50,000 Units by mouth every Sunday. ?  ? ?  ? ?  ?  ? ? ?  ?Discharge Care Instructions  ?(From admission, onward)  ?  ? ? ?  ? ?  Start     Ordered  ? 11/24/21 0000  Discharge wound care:       ?Comments: Clean skin near exit site with chloraprep swab sticks.  Starting at catheter, use circular pattern around exit site, moving towards outer edges of area covered by dressing.  Apply gentamicin cream to site once daily.  Cover with dry dressing.  ? 11/24/21 1324  ? ?  ?  ? ?  ? ? Follow-up Information   ? ? Hunsucker, Bonna Gains, MD Follow up.   ?Specialty: Pulmonary Disease ?Why: Office will call you with an appointment ?Contact information: ?[address] ?[address] ?[place] [address] ?[phone] ? ? ?  ?  ? ?  ?  ? ?  ? ?Feels fine ?Breathing fine ? ?Discharge Exam: ?Filed Weights  ? 11/22/21 4742 11/22/21 2036  ?Weight: 66.7 kg 66 kg  ? ?Physical Exam ?Vitals reviewed.  ?Constitutional:   ?   General: She is not in acute distress. ?   Appearance: She is not ill-appearing or toxic-appearing.  ?Cardiovascular:  ?   Rate and Rhythm: Normal rate and regular rhythm.  ?   Heart sounds: No murmur  heard. ?Pulmonary:  ?   Effort: Pulmonary effort is normal. No respiratory distress.  ?   Breath sounds: No wheezing, rhonchi or rales.  ?Neurological:  ?   Mental Status: She is alert.  ?Psychiatric:     ?   Mood and Affect: Mood normal.     ?   Behavior: Behavior normal.  ? ? ? ?Condition at discharge: good ? ?The results of significant diagnostics from this hospitalization (including imaging, microbiology, ancillary and laboratory) are listed below for reference.  ? ?Imaging Studies: ?DG Chest 1 View ? ?Result Date: 11/23/2021 ?CLINICAL DATA:  Persistent cough for 3 days EXAM: CHEST  1 VIEW COMPARISON:  CT angiogram of the chest dated 11/22/2021 FINDINGS: Stable left lower lobe airspace opacity. Stable cardiomegaly. The lungs appear otherwise clear. No significant blunting of the costophrenic angles. IMPRESSION: 1. Stable  retrocardiac airspace opacity. 2. Stable cardiomegaly. Electronically Signed   By: Van Clines M.D.   On: 11/23/2021 16:57  ? ?DG Chest 2 View ? ?Addendum Date: 11/22/2021   ?ADDENDUM REPORT: 11/22/2021 15:43 ADDENDUM: No skeletal stigmata of sickle cell disease. This was reported in error. Electronically Signed   By: Zetta Bills M.D.   On: 11/22/2021 15:43  ? ?Result Date: 11/22/2021 ?CLINICAL DATA:  A 26 year old female presents with history of cough, pneumonia for 2 days. EXAM: CHEST - 2 VIEW COMPARISON:  Comparison made with October 16, 2021. FINDINGS: Trachea midline. Cardiomediastinal contours and hilar structures are stable with cardiac enlargement. Increased interstitial markings are unchanged. Patchy basilar airspace opacities are suggested. No sign of pneumothorax. No evidence of pleural effusion. H-shaped vertebral bodies in this patient with sickle cell disease. No acute skeletal findings. IMPRESSION: Cardiomegaly with chronic increased interstitial markings superimposed mild edema is possible. Patchy basilar opacities may represent atelectasis, asymmetric edema or early  viral/atypical process. No lobar consolidation or sign of pleural effusion. Electronically Signed: By: Zetta Bills M.D. On: 11/22/2021 10:58  ? ?CT Angio Chest PE W and/or Wo Contrast ? ?Result Date: 11/22/2021 ?CLINICAL DAT

## 2021-11-24 NOTE — Progress Notes (Signed)
Mobility Specialist Progress Note ? ? 11/24/21 1147  ?Mobility  ?Activity Off unit ?(Pt in the process of being d/c'd/ getting ready to leave.)  ? ? ? ?Holland Falling ?Mobility Specialist ?Phone Number [phone] ? ?

## 2021-11-24 NOTE — Progress Notes (Signed)
Patient given discharge instructions and stated understanding.  IV removed and patient will be going to D/C lounge. ?

## 2021-11-24 NOTE — Progress Notes (Signed)
SATURATION QUALIFICATIONS: (This note is used to comply with regulatory documentation for home oxygen) ? ?Patient Saturations on Room Air at Rest = 100% ? ?Patient Saturations on Room Air while Ambulating = 94% ? ?No oxygen necessary for discharge.  ?Kathy Lewis  ?

## 2021-11-24 NOTE — Telephone Encounter (Signed)
Patient is scheduled 12/14/2021 at 11:15am with Dr. Silas Flood- patient aware, appointment reminder mailed to patient. Nothing further needed.  ?

## 2021-11-24 NOTE — TOC Transition Note (Signed)
Transition of Care (TOC) - CM/SW Discharge Note ?Marvetta Gibbons Therapist, sports, BSN ?Transitions of Care ?Unit 4E- RN Case Manager ?See Treatment Team for direct phone #  ? ? ?Patient Details  ?Name: Kathy Lewis ?MRN: [id number] ?Date of Birth: 1996/03/31 ? ?Transition of Care (TOC) CM/SW Contact:  ?Dahlia Client, Romeo Rabon, RN ?Phone Number: ?11/24/2021, 10:27 AM ? ? ?Clinical Narrative:    ?Pt stable for transition home today. Transition of Care Department [facility]) has reviewed patient and no TOC needs have been identified at this time.  ? ?Final next level of care: Home/Self Care ?Barriers to Discharge: No Barriers Identified ? ? ?Patient Goals and CMS Choice ?  ?  ?Choice offered to / list presented to : NA ? ?Discharge Placement ?  ?           ? Home ?  ?  ?  ? ?Discharge Plan and Services ?  ?  ?           ?DME Arranged: N/A ?DME Agency: NA ?  ?  ?  ?HH Arranged: NA ?[employer] Agency: NA ?  ?  ?  ? ?Social Determinants of Health (SDOH) Interventions ?  ? ? ?Readmission Risk Interventions ? ?  08/12/2021  ?  1:11 PM  ?Readmission Risk Prevention Plan  ?Transportation Screening Complete  ?Medication Review [employer]) Complete  ?PCP or Specialist appointment within 3-5 days of discharge Complete  ?[place] or Home Care Consult Complete  ?SW Recovery Care/Counseling Consult Complete  ?Palliative Care Screening Not Applicable  ?[place] Not Applicable  ? ? ? ? ? ?

## 2021-12-14 ENCOUNTER — Ambulatory Visit (INDEPENDENT_AMBULATORY_CARE_PROVIDER_SITE_OTHER): Payer: Medicaid Other

## 2021-12-14 ENCOUNTER — Encounter: Payer: Self-pay | Admitting: Pulmonary Disease

## 2021-12-14 ENCOUNTER — Ambulatory Visit (INDEPENDENT_AMBULATORY_CARE_PROVIDER_SITE_OTHER): Payer: Medicaid Other | Admitting: Pulmonary Disease

## 2021-12-14 VITALS — BP 120/72 | HR 88 | Temp 98.3°F | Ht 60.0 in | Wt 139.6 lb

## 2021-12-14 DIAGNOSIS — R918 Other nonspecific abnormal finding of lung field: Secondary | ICD-10-CM | POA: Diagnosis not present

## 2021-12-14 MED ORDER — AZELASTINE HCL 0.1 % NA SOLN: 2.0000 | Freq: Two times a day (BID) | NASAL | 12 refills | Status: DC

## 2021-12-14 MED ORDER — FLUTICASONE PROPIONATE 50 MCG/ACT NA SUSP: 1.0000 | Freq: Two times a day (BID) | NASAL | 2 refills | Status: DC

## 2021-12-14 NOTE — Patient Instructions (Signed)
Nice to meet you ? ?For the cough lets start with nasal sprays and to seem to have some congestion, runny nose.  Use Flonase 1 spray in each nostril twice a day and azelastine 2 sprays in each nostril twice a day. ? ?If the cough is not improved in the coming weeks, I would likely recommend trying an inhaler. ? ?We will repeat a chest x-ray today to make sure the small spot on the left lung is improving. ? ?Return to clinic in 2 months or sooner as needed with Dr. Silas Flood ?

## 2021-12-17 NOTE — Progress Notes (Signed)
Chest xray shows shadow from previous pneumonia is gone.

## 2022-01-17 ENCOUNTER — Emergency Department (HOSPITAL_BASED_OUTPATIENT_CLINIC_OR_DEPARTMENT_OTHER)
Admission: EM | Admit: 2022-01-17 | Discharge: 2022-01-17 | Disposition: A | Discharge status: Home / Self Care | Payer: Medicaid Other | Attending: Emergency Medicine | Admitting: Emergency Medicine

## 2022-01-17 ENCOUNTER — Encounter (HOSPITAL_BASED_OUTPATIENT_CLINIC_OR_DEPARTMENT_OTHER): Payer: Self-pay | Admitting: Emergency Medicine

## 2022-01-17 ENCOUNTER — Other Ambulatory Visit: Payer: Self-pay

## 2022-01-17 DIAGNOSIS — R197 Diarrhea, unspecified: Secondary | ICD-10-CM | POA: Insufficient documentation

## 2022-01-17 DIAGNOSIS — Z794 Long term (current) use of insulin: Secondary | ICD-10-CM | POA: Insufficient documentation

## 2022-01-17 DIAGNOSIS — Z992 Dependence on renal dialysis: Secondary | ICD-10-CM | POA: Diagnosis not present

## 2022-01-17 DIAGNOSIS — R9431 Abnormal electrocardiogram [ECG] [EKG]: Secondary | ICD-10-CM

## 2022-01-17 DIAGNOSIS — I10 Essential (primary) hypertension: Secondary | ICD-10-CM

## 2022-01-17 DIAGNOSIS — R112 Nausea with vomiting, unspecified: Secondary | ICD-10-CM | POA: Diagnosis present

## 2022-01-17 DIAGNOSIS — I5020 Unspecified systolic (congestive) heart failure: Secondary | ICD-10-CM | POA: Diagnosis not present

## 2022-01-17 DIAGNOSIS — N186 End stage renal disease: Secondary | ICD-10-CM | POA: Insufficient documentation

## 2022-01-17 DIAGNOSIS — R030 Elevated blood-pressure reading, without diagnosis of hypertension: Secondary | ICD-10-CM | POA: Diagnosis not present

## 2022-01-17 DIAGNOSIS — D72819 Decreased white blood cell count, unspecified: Secondary | ICD-10-CM | POA: Insufficient documentation

## 2022-01-17 DIAGNOSIS — D631 Anemia in chronic kidney disease: Secondary | ICD-10-CM | POA: Diagnosis not present

## 2022-01-17 DIAGNOSIS — I4581 Long QT syndrome: Secondary | ICD-10-CM | POA: Diagnosis not present

## 2022-01-17 DIAGNOSIS — E119 Type 2 diabetes mellitus without complications: Secondary | ICD-10-CM | POA: Insufficient documentation

## 2022-01-17 DIAGNOSIS — I110 Hypertensive heart disease with heart failure: Secondary | ICD-10-CM | POA: Diagnosis not present

## 2022-01-17 DIAGNOSIS — Z79899 Other long term (current) drug therapy: Secondary | ICD-10-CM | POA: Insufficient documentation

## 2022-01-17 LAB — CBC WITH DIFFERENTIAL/PLATELET
Abs Immature Granulocytes: 0.01 10*3/uL (ref 0.00–0.07)
Basophils Absolute: 0.1 10*3/uL (ref 0.0–0.1)
Basophils Relative: 2 %
Eosinophils Absolute: 0 10*3/uL (ref 0.0–0.5)
Eosinophils Relative: 1 %
HCT: 32.9 % — ABNORMAL LOW (ref 36.0–46.0)
Hemoglobin: 10.9 g/dL — ABNORMAL LOW (ref 12.0–15.0)
Immature Granulocytes: 0 %
Lymphocytes Relative: 43 %
Lymphs Abs: 1.5 10*3/uL (ref 0.7–4.0)
MCH: 31.6 pg (ref 26.0–34.0)
MCHC: 33.1 g/dL (ref 30.0–36.0)
MCV: 95.4 fL (ref 80.0–100.0)
Monocytes Absolute: 0.1 10*3/uL (ref 0.1–1.0)
Monocytes Relative: 4 %
Neutro Abs: 1.7 10*3/uL (ref 1.7–7.7)
Neutrophils Relative %: 50 %
Platelets: 234 10*3/uL (ref 150–400)
RBC: 3.45 MIL/uL — ABNORMAL LOW (ref 3.87–5.11)
RDW: 13.6 % (ref 11.5–15.5)
WBC: 3.4 10*3/uL — ABNORMAL LOW (ref 4.0–10.5)
nRBC: 0 % (ref 0.0–0.2)

## 2022-01-17 LAB — BASIC METABOLIC PANEL
Anion gap: 9 (ref 5–15)
BUN: 57 mg/dL — ABNORMAL HIGH (ref 6–20)
CO2: 23 mmol/L (ref 22–32)
Calcium: 8.2 mg/dL — ABNORMAL LOW (ref 8.9–10.3)
Chloride: 103 mmol/L (ref 98–111)
Creatinine, Ser: 8.92 mg/dL — ABNORMAL HIGH (ref 0.44–1.00)
GFR, Estimated: 6 mL/min — ABNORMAL LOW (ref >60)
Glucose, Bld: 136 mg/dL — ABNORMAL HIGH (ref 70–99)
Potassium: 5 mmol/L (ref 3.5–5.1)
Sodium: 135 mmol/L (ref 135–145)

## 2022-01-17 LAB — CBG MONITORING, ED: Glucose-Capillary: 119 mg/dL — ABNORMAL HIGH (ref 70–99)

## 2022-01-17 LAB — MAGNESIUM: Magnesium: 2.3 mg/dL (ref 1.7–2.4)

## 2022-01-17 MED ORDER — METOCLOPRAMIDE HCL 5 MG/ML IJ SOLN
10.0000 mg | Freq: Once | INTRAMUSCULAR | Status: AC
Start: 2022-01-17 — End: 2022-01-17
  Administered 2022-01-17: 10 mg via INTRAVENOUS
  Filled 2022-01-17: qty 2

## 2022-01-17 MED ORDER — LACTATED RINGERS IV BOLUS
1000.0000 mL | Freq: Once | INTRAVENOUS | Status: AC
Start: 2022-01-17 — End: 2022-01-17
  Administered 2022-01-17: 1000 mL via INTRAVENOUS

## 2022-01-17 MED ORDER — ONDANSETRON HCL 4 MG/2ML IJ SOLN
4.0000 mg | Freq: Once | INTRAMUSCULAR | Status: AC
  Administered 2022-01-17: 4 mg via INTRAVENOUS
  Filled 2022-01-17: qty 2

## 2022-01-17 MED ORDER — LOPERAMIDE HCL 2 MG PO CAPS
4.0000 mg | ORAL_CAPSULE | Freq: Once | ORAL | Status: AC
  Administered 2022-01-17: 4 mg via ORAL
  Filled 2022-01-17: qty 2

## 2022-01-17 MED ORDER — METOCLOPRAMIDE HCL 10 MG PO TABS: 10.0000 mg | ORAL_TABLET | Freq: Three times a day (TID) | ORAL | 0 refills | Status: DC | PRN

## 2022-01-17 NOTE — ED Notes (Signed)
Pt states nausea much improved, denies pain, requesting discharge

## 2022-01-17 NOTE — ED Provider Notes (Signed)
[facility] EMERGENCY DEPARTMENT Provider Note   CSN: [id number] Arrival date & time: 01/17/22  0425     History  Chief Complaint  Patient presents with   Emesis   Diarrhea    Kathy Lewis is a 26 y.o. female.  The history is provided by the patient.  Emesis Associated symptoms: diarrhea   Diarrhea Associated symptoms: vomiting   She has history of hypertension, diabetes, hyperlipidemia, systolic heart failure, end-stage renal disease on peritoneal dialysis comes in with 2-day history of vomiting or diarrhea.  She has been vomiting 2-3 times during the course of the day, but has been having worse loose to watery bowel movements.  She denies any blood or mucus in stool or emesis.  She denies fever, chills, sweats.  She denies any abdominal pain.  She denies any sick contacts.  She denies any suspicious food intake.   Home Medications Prior to Admission medications   Medication Sig Start Date End Date Taking? Authorizing Provider  atorvastatin (LIPITOR) 40 MG tablet Take 80 mg by mouth daily. 08/03/21   [provider]  azelastine (ASTELIN) 0.1 % nasal spray Place 2 sprays into both nostrils 2 (two) times daily. Use in each nostril as directed 12/14/21   Hunsucker, Bonna Gains, MD  butalbital-acetaminophen-caffeine [facility]) [phone] MG tablet Take 1 tablet by mouth daily as needed for headache. 02/18/21 02/18/22  Dwyane Dee, MD  calcitRIOL (ROCALTROL) 0.25 MCG capsule Take 0.25 mcg by mouth daily. 08/03/21   [provider]  carvedilol (COREG) 12.5 MG tablet Take 6.125 mg by mouth 2 (two) times daily with a meal.    [provider]  FEROSUL 325 (65 Fe) MG tablet Take 325 mg by mouth 3 (three) times daily. 04/30/21   [provider]  fluticasone (FLONASE) 50 MCG/ACT nasal spray Place 1 spray into both nostrils in the morning and at bedtime. 12/14/21   Hunsucker, Bonna Gains, MD  gentamicin cream (GARAMYCIN) 0.1 % Apply 1 application topically  daily. Apply to exit site. 08/12/21   Rai, Vernelle Emerald, MD  hydrALAZINE (APRESOLINE) 100 MG tablet Take 100 mg by mouth 3 (three) times daily. 08/05/21   [provider]  Insulin Syringe-Needle U-100 (INSULIN SYRINGE .3CC/31GX5/16") 31G X 5/16" 0.3 ML MISC Use as directed 10/27/19   [name], MD  isosorbide mononitrate (IMDUR) 30 MG 24 hr tablet Take 30 mg by mouth daily.    [provider]  magnesium oxide (MAG-OX) 400 MG tablet Take 800 mg by mouth daily. 08/03/21   [provider]  medroxyPROGESTERone (DEPO-PROVERA) 150 MG/ML injection Inject 1 mL (150 mg total) into the muscle every 3 (three) months. 03/29/21   Gavin Pound, CNM  methocarbamol (ROBAXIN) 500 MG tablet Take 500 mg by mouth 4 (four) times daily as needed for muscle spasms. 03/09/21   [provider]  prochlorperazine (COMPAZINE) 5 MG tablet Take 1 tablet (5 mg total) by mouth every 6 (six) hours as needed for nausea or vomiting. 08/12/21   Rai, Ripudeep K, MD  sacubitril-valsartan (ENTRESTO) 24-26 MG Take 1 tablet by mouth 2 (two) times daily.    [provider]  Vitamin D, Ergocalciferol, (DRISDOL) 1.25 MG (50000 UNIT) CAPS capsule Take 50,000 Units by mouth every Sunday.    [provider]  escitalopram (LEXAPRO) 10 MG tablet Take 20 mg by mouth daily.  05/10/20 10/06/20  [provider]  furosemide (LASIX) 40 MG tablet Take 1 tablet (40 mg total) by mouth daily. Patient not taking: No  sig reported 10/28/19 02/17/21  [name], MD  lisinopril (ZESTRIL) 10 MG tablet Take 10 mg by mouth daily.  02/23/20 10/06/20  [provider]  spironolactone (ALDACTONE) 25 MG tablet Take 25 mg by mouth daily.  04/20/20 10/06/20  [provider]      Allergies    Cantaloupe extract allergy skin test, Strawberry extract, Citrullus vulgaris, and Nsaids    Review of Systems   Review of Systems  Gastrointestinal:  Positive for diarrhea and vomiting.  All other systems  reviewed and are negative.  Physical Exam Updated Vital Signs BP (!) 139/114 (BP Location: Right Arm)   Pulse 84   Temp 98.3 F (36.8 C) (Oral)   Resp 18   Ht 5' (1.524 m)   Wt 63.5 kg   SpO2 100%   BMI 27.34 kg/m  Physical Exam Vitals and nursing note reviewed.  26 year old female, resting comfortably and in no acute distress. Vital signs are significant for elevated blood pressure. Oxygen saturation is 100%, which is normal. Head is normocephalic and atraumatic. PERRLA, EOMI. Oropharynx is clear. Neck is nontender and supple without adenopathy or JVD. Back is nontender and there is no CVA tenderness. Lungs are clear without rales, wheezes, or rhonchi. Chest is nontender. Heart has regular rate and rhythm without murmur. Abdomen is soft, flat, nontender.  Peristalsis is hypoactive.  Peritoneal dialysis catheter is present in the right upper quadrant. Extremities have no cyanosis or edema, full range of motion is present. Skin is warm and dry without rash. Neurologic: Mental status is normal, cranial nerves are intact, moves all extremities equally.  ED Results / Procedures / Treatments   Labs (all labs ordered are listed, but only abnormal results are displayed) Labs Reviewed  BASIC METABOLIC PANEL - Abnormal; Notable for the following components:      Result Value   Glucose, Bld 136 (*)    BUN 57 (*)    Creatinine, Ser 8.92 (*)    Calcium 8.2 (*)    GFR, Estimated 6 (*)    All other components within normal limits  CBC WITH DIFFERENTIAL/PLATELET - Abnormal; Notable for the following components:   WBC 3.4 (*)    RBC 3.45 (*)    Hemoglobin 10.9 (*)    HCT 32.9 (*)    All other components within normal limits  CBG MONITORING, ED - Abnormal; Notable for the following components:   Glucose-Capillary 119 (*)    All other components within normal limits  MAGNESIUM  URINALYSIS, ROUTINE W REFLEX MICROSCOPIC  PREGNANCY, URINE    EKG EKG  Interpretation  Date/Time:  Tuesday Jan 17 2022 04:56:42 EDT Ventricular Rate:  86 PR Interval:  147 QRS Duration: 76 QT Interval:  448 QTC Calculation: 536 R Axis:   42 Text Interpretation: Sinus rhythm Low voltage, extremity and precordial leads Minimal ST depression, lateral leads Prolonged QT interval When compared with ECG of 08/12/2021, Low voltage QRS is now present Confirmed by Delora Fuel ([id number]) on 01/17/2022 5:25:18 AM  Procedures Procedures    Medications Ordered in ED Medications  metoCLOPramide (REGLAN) injection 10 mg (has no administration in time range)  ondansetron (ZOFRAN) injection 4 mg (4 mg Intravenous Given 01/17/22 0509)  loperamide (IMODIUM) capsule 4 mg (4 mg Oral Given 01/17/22 0510)  lactated ringers bolus 1,000 mL (0 mLs Intravenous Stopped 01/17/22 2094)    ED Course/ Medical Decision Making/ A&P  Medical Decision Making Amount and/or Complexity of Data Reviewed Labs: ordered.  Risk Prescription drug management.   Nausea, vomiting, diarrhea more suggestive of viral gastroenteritis.  Consider food poisoning, bowel obstruction.  She does have a history of ketoacidosis, but presentation is not typical of ketoacidosis.  On review of old records, it is noted that she has had prolonged QT interval in the past, will need to check ECG as home with antiemetics, potential for inducing all torsade de pointes in patients with prolonged QT interval.  She will be given IV fluids, ondansetron for nausea.  Will check ECG to assess QT interval and will check magnesium level as well as electrolytes.  I have independently reviewed and interpreted the ECG.  It shows prolonged QT interval which is unchanged from prior.  I have reviewed and interpreted all of the labs and they show elevated BUN and creatinine consistent with end-stage renal disease, mild anemia which is likely secondary to end-stage renal disease, improved compared with baseline.   Mild leukopenia is present and had been present on prior blood draws and possibly related to a viral illness.  Magnesium level is normal.  Following IV fluids and ondansetron, she states that she is still having nausea.  Sense of impending diarrhea is improved following loperamide.  I will try metoclopramide to see if she gets better control of nausea with that.  Case is signed out to Dr. Roslynn Amble to assess her response to metoclopramide.  Final Clinical Impression(s) / ED Diagnoses Final diagnoses:  Nausea vomiting and diarrhea  Anemia associated with chronic renal failure  End-stage renal disease on peritoneal dialysis (HCC)  Prolonged Q-T interval on ECG  Elevated blood pressure reading with diagnosis of hypertension    Rx / DC Orders ED Discharge Orders     None         Delora Fuel, MD 22/02/54 [phone]

## 2022-01-17 NOTE — ED Triage Notes (Signed)
Vomiting and diarrhea X 2 days with light headedness and SOB

## 2022-01-17 NOTE — ED Notes (Signed)
Checked CBG 119, RN Shaun informed

## 2022-01-17 NOTE — Discharge Instructions (Addendum)
Take loperamide (Imodium AD) as needed for diarrhea.  Follow up with your primary doctor.   Come back if you have worsening nausea, any abdominal pain, fever or other new concerns.

## 2022-01-21 ENCOUNTER — Emergency Department (HOSPITAL_BASED_OUTPATIENT_CLINIC_OR_DEPARTMENT_OTHER): Payer: Medicaid Other

## 2022-01-21 ENCOUNTER — Encounter (HOSPITAL_BASED_OUTPATIENT_CLINIC_OR_DEPARTMENT_OTHER): Payer: Self-pay

## 2022-01-21 ENCOUNTER — Other Ambulatory Visit: Payer: Self-pay

## 2022-01-21 ENCOUNTER — Emergency Department (HOSPITAL_BASED_OUTPATIENT_CLINIC_OR_DEPARTMENT_OTHER)
Admission: EM | Admit: 2022-01-21 | Discharge: 2022-01-21 | Disposition: A | Discharge status: Home / Self Care | Payer: Medicaid Other | Attending: Emergency Medicine | Admitting: Emergency Medicine

## 2022-01-21 DIAGNOSIS — I509 Heart failure, unspecified: Secondary | ICD-10-CM | POA: Insufficient documentation

## 2022-01-21 DIAGNOSIS — R140 Abdominal distension (gaseous): Secondary | ICD-10-CM | POA: Diagnosis not present

## 2022-01-21 DIAGNOSIS — N189 Chronic kidney disease, unspecified: Secondary | ICD-10-CM | POA: Insufficient documentation

## 2022-01-21 DIAGNOSIS — E1022 Type 1 diabetes mellitus with diabetic chronic kidney disease: Secondary | ICD-10-CM | POA: Insufficient documentation

## 2022-01-21 DIAGNOSIS — R1084 Generalized abdominal pain: Secondary | ICD-10-CM | POA: Insufficient documentation

## 2022-01-21 DIAGNOSIS — R112 Nausea with vomiting, unspecified: Secondary | ICD-10-CM | POA: Insufficient documentation

## 2022-01-21 DIAGNOSIS — Z20822 Contact with and (suspected) exposure to covid-19: Secondary | ICD-10-CM | POA: Diagnosis not present

## 2022-01-21 DIAGNOSIS — Z992 Dependence on renal dialysis: Secondary | ICD-10-CM | POA: Insufficient documentation

## 2022-01-21 DIAGNOSIS — D631 Anemia in chronic kidney disease: Secondary | ICD-10-CM | POA: Insufficient documentation

## 2022-01-21 DIAGNOSIS — D72819 Decreased white blood cell count, unspecified: Secondary | ICD-10-CM | POA: Diagnosis not present

## 2022-01-21 DIAGNOSIS — R197 Diarrhea, unspecified: Secondary | ICD-10-CM | POA: Insufficient documentation

## 2022-01-21 LAB — COMPREHENSIVE METABOLIC PANEL
ALT: 18 U/L (ref 0–44)
AST: 15 U/L (ref 15–41)
Albumin: 2.9 g/dL — ABNORMAL LOW (ref 3.5–5.0)
Alkaline Phosphatase: 66 U/L (ref 38–126)
Anion gap: 10 (ref 5–15)
BUN: 57 mg/dL — ABNORMAL HIGH (ref 6–20)
CO2: 24 mmol/L (ref 22–32)
Calcium: 8.2 mg/dL — ABNORMAL LOW (ref 8.9–10.3)
Chloride: 103 mmol/L (ref 98–111)
Creatinine, Ser: 8.43 mg/dL — ABNORMAL HIGH (ref 0.44–1.00)
GFR, Estimated: 6 mL/min — ABNORMAL LOW (ref >60)
Glucose, Bld: 114 mg/dL — ABNORMAL HIGH (ref 70–99)
Potassium: 4.7 mmol/L (ref 3.5–5.1)
Sodium: 137 mmol/L (ref 135–145)
Total Bilirubin: 0.7 mg/dL (ref 0.3–1.2)
Total Protein: 6.2 g/dL — ABNORMAL LOW (ref 6.5–8.1)

## 2022-01-21 LAB — CBC WITH DIFFERENTIAL/PLATELET
Abs Immature Granulocytes: 0.01 10*3/uL (ref 0.00–0.07)
Basophils Absolute: 0 10*3/uL (ref 0.0–0.1)
Basophils Relative: 1 %
Eosinophils Absolute: 0 10*3/uL (ref 0.0–0.5)
Eosinophils Relative: 1 %
HCT: 35.6 % — ABNORMAL LOW (ref 36.0–46.0)
Hemoglobin: 11.6 g/dL — ABNORMAL LOW (ref 12.0–15.0)
Immature Granulocytes: 0 %
Lymphocytes Relative: 30 %
Lymphs Abs: 1 10*3/uL (ref 0.7–4.0)
MCH: 31.9 pg (ref 26.0–34.0)
MCHC: 32.6 g/dL (ref 30.0–36.0)
MCV: 97.8 fL (ref 80.0–100.0)
Monocytes Absolute: 0.2 10*3/uL (ref 0.1–1.0)
Monocytes Relative: 6 %
Neutro Abs: 2.2 10*3/uL (ref 1.7–7.7)
Neutrophils Relative %: 62 %
Platelets: 251 10*3/uL (ref 150–400)
RBC: 3.64 MIL/uL — ABNORMAL LOW (ref 3.87–5.11)
RDW: 14 % (ref 11.5–15.5)
WBC: 3.5 10*3/uL — ABNORMAL LOW (ref 4.0–10.5)
nRBC: 0 % (ref 0.0–0.2)

## 2022-01-21 LAB — LIPASE, BLOOD: Lipase: 18 U/L (ref 11–51)

## 2022-01-21 LAB — SARS CORONAVIRUS 2 BY RT PCR: SARS Coronavirus 2 by RT PCR: NEGATIVE

## 2022-01-21 MED ORDER — DIPHENHYDRAMINE HCL 50 MG/ML IJ SOLN
25.0000 mg | Freq: Once | INTRAMUSCULAR | Status: AC
  Administered 2022-01-21: 25 mg via INTRAVENOUS
  Filled 2022-01-21: qty 1

## 2022-01-21 MED ORDER — PROCHLORPERAZINE MALEATE 5 MG PO TABS: 5.0000 mg | ORAL_TABLET | Freq: Four times a day (QID) | ORAL | 0 refills | Status: DC | PRN

## 2022-01-21 MED ORDER — METOCLOPRAMIDE HCL 5 MG/ML IJ SOLN
10.0000 mg | Freq: Once | INTRAMUSCULAR | Status: AC
  Administered 2022-01-21: 10 mg via INTRAVENOUS
  Filled 2022-01-21: qty 2

## 2022-01-21 MED ORDER — HYDROMORPHONE HCL 1 MG/ML IJ SOLN
0.5000 mg | Freq: Once | INTRAMUSCULAR | Status: AC
  Administered 2022-01-21: 0.5 mg via INTRAVENOUS
  Filled 2022-01-21: qty 1

## 2022-01-21 NOTE — ED Notes (Signed)
ED Provider at bedside. 

## 2022-01-21 NOTE — ED Provider Notes (Signed)
[facility] DEPARTMENT Provider Note   CSN: [id number] Arrival date & time: 01/21/22  1326     History  Chief Complaint  Patient presents with   Emesis    Kathy Lewis is a 26 y.o. female.  26 yo F with a chief complaints of nausea and vomiting.  The patient was seen earlier in the week and had similar symptoms.  Was having nausea vomiting and diarrhea.  She feels bad when she instills fluid for her peritoneal dialysis and is only been able to get a certain amount and before she feels a bit more bloated and has to stop.  She is only able to put in 500 mL this morning.  She denies fevers.  No known sick contacts.  No focal abdominal pain.   Emesis     Home Medications Prior to Admission medications   Medication Sig Start Date End Date Taking? Authorizing Provider  atorvastatin (LIPITOR) 40 MG tablet Take 80 mg by mouth daily. 08/03/21   [provider]  azelastine (ASTELIN) 0.1 % nasal spray Place 2 sprays into both nostrils 2 (two) times daily. Use in each nostril as directed 12/14/21   Hunsucker, Bonna Gains, MD  butalbital-acetaminophen-caffeine [facility]) [phone] MG tablet Take 1 tablet by mouth daily as needed for headache. 02/18/21 02/18/22  Dwyane Dee, MD  calcitRIOL (ROCALTROL) 0.25 MCG capsule Take 0.25 mcg by mouth daily. 08/03/21   [provider]  carvedilol (COREG) 12.5 MG tablet Take 6.125 mg by mouth 2 (two) times daily with a meal.    [provider]  FEROSUL 325 (65 Fe) MG tablet Take 325 mg by mouth 3 (three) times daily. 04/30/21   [provider]  fluticasone (FLONASE) 50 MCG/ACT nasal spray Place 1 spray into both nostrils in the morning and at bedtime. 12/14/21   Hunsucker, Bonna Gains, MD  gentamicin cream (GARAMYCIN) 0.1 % Apply 1 application topically daily. Apply to exit site. 08/12/21   Rai, Vernelle Emerald, MD  hydrALAZINE (APRESOLINE) 100 MG tablet Take 100 mg by mouth 3 (three) times daily. 08/05/21    [provider]  Insulin Syringe-Needle U-100 (INSULIN SYRINGE .3CC/31GX5/16") 31G X 5/16" 0.3 ML MISC Use as directed 10/27/19   [name], MD  isosorbide mononitrate (IMDUR) 30 MG 24 hr tablet Take 30 mg by mouth daily.    [provider]  magnesium oxide (MAG-OX) 400 MG tablet Take 800 mg by mouth daily. 08/03/21   [provider]  medroxyPROGESTERone (DEPO-PROVERA) 150 MG/ML injection Inject 1 mL (150 mg total) into the muscle every 3 (three) months. 03/29/21   Gavin Pound, CNM  methocarbamol (ROBAXIN) 500 MG tablet Take 500 mg by mouth 4 (four) times daily as needed for muscle spasms. 03/09/21   [provider]  metoCLOPramide (REGLAN) 10 MG tablet Take 1 tablet (10 mg total) by mouth every 8 (eight) hours as needed for nausea. 01/17/22   Lucrezia Starch, MD  prochlorperazine (COMPAZINE) 5 MG tablet Take 1 tablet (5 mg total) by mouth every 6 (six) hours as needed for nausea or vomiting. 08/12/21   Rai, Ripudeep K, MD  sacubitril-valsartan (ENTRESTO) 24-26 MG Take 1 tablet by mouth 2 (two) times daily.    [provider]  Vitamin D, Ergocalciferol, (DRISDOL) 1.25 MG (50000 UNIT) CAPS capsule Take 50,000 Units by mouth every Sunday.    [provider]  escitalopram (LEXAPRO) 10 MG tablet Take 20 mg by mouth daily.  05/10/20 10/06/20  [provider]  furosemide (LASIX) 40 MG tablet Take 1 tablet (40 mg total) by mouth daily. Patient not taking: No sig reported 10/28/19 02/17/21  [name], MD  lisinopril (ZESTRIL) 10 MG tablet Take 10 mg by mouth daily.  02/23/20 10/06/20  [provider]  spironolactone (ALDACTONE) 25 MG tablet Take 25 mg by mouth daily.  04/20/20 10/06/20  [provider]      Allergies    Cantaloupe extract allergy skin test, Strawberry extract, Citrullus vulgaris, and Nsaids    Review of Systems   Review of Systems  Gastrointestinal:  Positive for vomiting.   Physical Exam Updated Vital  Signs BP (!) 158/123   Pulse (!) 105   Temp 98 F (36.7 C) (Oral)   Resp 18   Wt 65.9 kg   SpO2 100%   BMI 28.37 kg/m  Physical Exam Vitals and nursing note reviewed.  Constitutional:      General: She is not in acute distress.    Appearance: She is well-developed. She is not diaphoretic.  HENT:     Head: Normocephalic and atraumatic.  Eyes:     Pupils: Pupils are equal, round, and reactive to light.  Cardiovascular:     Rate and Rhythm: Normal rate and regular rhythm.     Heart sounds: No murmur heard.   No friction rub. No gallop.  Pulmonary:     Effort: Pulmonary effort is normal.     Breath sounds: No wheezing or rales.  Abdominal:     General: There is distension.     Palpations: Abdomen is soft.     Tenderness: There is no abdominal tenderness.     Comments: Abdominal distention with a positive fluid wave.  Mild diffuse abdominal discomfort.  Musculoskeletal:        General: No tenderness.     Cervical back: Normal range of motion and neck supple.  Skin:    General: Skin is warm and dry.  Neurological:     Mental Status: She is alert and oriented to person, place, and time.  Psychiatric:        Behavior: Behavior normal.    ED Results / Procedures / Treatments   Labs (all labs ordered are listed, but only abnormal results are displayed) Labs Reviewed  CBC WITH DIFFERENTIAL/PLATELET - Abnormal; Notable for the following components:      Result Value   WBC 3.5 (*)    RBC 3.64 (*)    Hemoglobin 11.6 (*)    HCT 35.6 (*)    All other components within normal limits  COMPREHENSIVE METABOLIC PANEL - Abnormal; Notable for the following components:   Glucose, Bld 114 (*)    BUN 57 (*)    Creatinine, Ser 8.43 (*)    Calcium 8.2 (*)    Total Protein 6.2 (*)    Albumin 2.9 (*)    GFR, Estimated 6 (*)    All other components within normal limits  SARS CORONAVIRUS 2 BY RT PCR  LIPASE, BLOOD    EKG None  Radiology No results  found.  Procedures Procedures    Medications Ordered in ED Medications  metoCLOPramide (REGLAN) injection 10 mg (10 mg Intravenous Given 01/21/22 1519)  diphenhydrAMINE (BENADRYL) injection 25 mg (25 mg Intravenous Given 01/21/22 1508)  HYDROmorphone (DILAUDID) injection 0.5 mg (0.5 mg Intravenous Given 01/21/22 1517)    ED Course/ Medical Decision Making/ A&P  Medical Decision Making Amount and/or Complexity of Data Reviewed Labs: ordered. Radiology: ordered.  Risk Prescription drug management.   26 yo F with a chief complaints of nausea vomiting and diarrhea.  This been going on the better part of the week.  She had to come to the emergency department for the symptoms earlier in the week and had symptomatic therapy after Reglan and Benadryl.  She has had worsening abdominal discomfort that usually occurs when she tries to instill her dialysis fluid.  Was only able to put 500 cc in today.  No mechanical blockage but does not feel well and she does it.  We will obtain a laboratory evaluation.  Attempt to treat pain and nausea.  Reassess.  No significant anemia, no significant electrolyte abnormality.  K normal.  Lipase normal.  Will CT.   Signed out to Dr. Armandina Gemma, please see their note for further details of care in the ED.  The patients results and plan were reviewed and discussed.   Any x-rays performed were independently reviewed by myself.   Differential diagnosis were considered with the presenting HPI.  Medications  metoCLOPramide (REGLAN) injection 10 mg (10 mg Intravenous Given 01/21/22 1519)  diphenhydrAMINE (BENADRYL) injection 25 mg (25 mg Intravenous Given 01/21/22 1508)  HYDROmorphone (DILAUDID) injection 0.5 mg (0.5 mg Intravenous Given 01/21/22 1517)    Vitals:   01/21/22 1335 01/21/22 1335 01/21/22 1515  BP:  (!) 141/117 (!) 158/123  Pulse:  90 (!) 105  Resp:  17 18  Temp:  98 F (36.7 C)   TempSrc:  Oral   SpO2:  100% 100%   Weight: 65.9 kg      Final diagnoses:  Nausea vomiting and diarrhea           Final Clinical Impression(s) / ED Diagnoses Final diagnoses:  Nausea vomiting and diarrhea    Rx / DC Orders ED Discharge Orders     None         Deno Etienne, DO 01/21/22 1533

## 2022-01-21 NOTE — Discharge Instructions (Signed)
Please return to the emergency department in the event of fever, chills, worsening severe abdominal pain as this could be a sign of a life-threatening infection involving your peritoneal fluid.

## 2022-01-21 NOTE — ED Provider Notes (Signed)
  Physical Exam  BP (!) 141/117 (BP Location: Right Arm)   Pulse 90   Temp 98 F (36.7 C) (Oral)   Resp 17   Wt 65.9 kg   SpO2 100%   BMI 28.37 kg/m     Procedures  Procedures  ED Course / MDM    Medical Decision Making Amount and/or Complexity of Data Reviewed Labs: ordered. Radiology: ordered.  Risk Prescription drug management.   78F with a hx of CKD, CHF, DM1 and DKA, bipolar 2 disorder, depression, on peritoneal dialysis now who presents to the ED with nausea and vomiting since Sunday. Also endorsing abdominal pain. Having trouble getting the dialysis fluid into her abdomen for the past two days. CT Abdomen pending. Plan for reassessment following labs and CT imaging.   On repeat assessment, the patient had an abdomen that was soft, nontender, nonperitonitic.  She states that she is feeling better following IV Reglan, Dilaudid and Benadryl.  She is tolerating oral intake.  Labs significant for leukopenia to 3.5, mild anemia to 11.6, CMP without significant electrolyte abnormality, BUN 57, creatinine 8.43, lipase normal, COVID negative.  CT abdomen pelvis without contrast performed revealed the following: IMPRESSION:  1. No acute findings. No evidence of bowel obstruction or  inflammation.  2. Mild cardiomegaly, small pericardial effusion, small amount of  ascites and diffuse subcutaneous soft tissue edema, new since the  prior CT.       The patient has a reassuring abdominal exam on repeat assessment with no findings concerning for peritonitis.  She is stable for discharge.  Return precautions provided in the event of worsening signs concerning for development of peritonitis to include fever, chills, worsening abdominal pain.  The patient endorsed understanding of the return precautions.  Stable for discharge.       Regan Lemming, MD 01/21/22 [id number]

## 2022-01-21 NOTE — ED Notes (Signed)
Patients blood pressure is elevated notified provider denies any s/s of hypertension . States that this is normal for  her

## 2022-01-21 NOTE — ED Triage Notes (Signed)
Pt presents with vomiting since Sunday. Pt seen Wednesday for vomiting. Since then she continues to vomit and unable to put as much fluid in abdomen as usual with peritoneal diaylsis due to pain. Normally able to put 1500 ml, now only 1000 ml  x 2 days

## 2022-01-21 NOTE — ED Notes (Signed)
Patient transported to CT 

## 2022-01-22 ENCOUNTER — Encounter (HOSPITAL_COMMUNITY): Payer: Self-pay | Admitting: Emergency Medicine

## 2022-01-22 ENCOUNTER — Emergency Department (HOSPITAL_COMMUNITY)
Admission: EM | Admit: 2022-01-22 | Discharge: 2022-01-23 | Disposition: A | Discharge status: Home / Self Care | Payer: Medicaid Other | Source: Home / Self Care | Attending: Emergency Medicine | Admitting: Emergency Medicine

## 2022-01-22 ENCOUNTER — Other Ambulatory Visit: Payer: Self-pay

## 2022-01-22 DIAGNOSIS — I509 Heart failure, unspecified: Secondary | ICD-10-CM | POA: Insufficient documentation

## 2022-01-22 DIAGNOSIS — E1022 Type 1 diabetes mellitus with diabetic chronic kidney disease: Secondary | ICD-10-CM | POA: Insufficient documentation

## 2022-01-22 DIAGNOSIS — N186 End stage renal disease: Secondary | ICD-10-CM | POA: Insufficient documentation

## 2022-01-22 DIAGNOSIS — Z992 Dependence on renal dialysis: Secondary | ICD-10-CM | POA: Insufficient documentation

## 2022-01-22 DIAGNOSIS — R1084 Generalized abdominal pain: Secondary | ICD-10-CM | POA: Insufficient documentation

## 2022-01-22 DIAGNOSIS — N9489 Other specified conditions associated with female genital organs and menstrual cycle: Secondary | ICD-10-CM | POA: Insufficient documentation

## 2022-01-22 LAB — COMPREHENSIVE METABOLIC PANEL
ALT: 16 U/L (ref 0–44)
AST: 15 U/L (ref 15–41)
Albumin: 2.5 g/dL — ABNORMAL LOW (ref 3.5–5.0)
Alkaline Phosphatase: 61 U/L (ref 38–126)
Anion gap: 9 (ref 5–15)
BUN: 61 mg/dL — ABNORMAL HIGH (ref 6–20)
CO2: 23 mmol/L (ref 22–32)
Calcium: 8.1 mg/dL — ABNORMAL LOW (ref 8.9–10.3)
Chloride: 103 mmol/L (ref 98–111)
Creatinine, Ser: 9.18 mg/dL — ABNORMAL HIGH (ref 0.44–1.00)
GFR, Estimated: 6 mL/min — ABNORMAL LOW (ref >60)
Glucose, Bld: 117 mg/dL — ABNORMAL HIGH (ref 70–99)
Potassium: 4.7 mmol/L (ref 3.5–5.1)
Sodium: 135 mmol/L (ref 135–145)
Total Bilirubin: 0.5 mg/dL (ref 0.3–1.2)
Total Protein: 5.3 g/dL — ABNORMAL LOW (ref 6.5–8.1)

## 2022-01-22 LAB — CBC
HCT: 34.1 % — ABNORMAL LOW (ref 36.0–46.0)
Hemoglobin: 10.7 g/dL — ABNORMAL LOW (ref 12.0–15.0)
MCH: 31.4 pg (ref 26.0–34.0)
MCHC: 31.4 g/dL (ref 30.0–36.0)
MCV: 100 fL (ref 80.0–100.0)
Platelets: 231 10*3/uL (ref 150–400)
RBC: 3.41 MIL/uL — ABNORMAL LOW (ref 3.87–5.11)
RDW: 13.9 % (ref 11.5–15.5)
WBC: 3.4 10*3/uL — ABNORMAL LOW (ref 4.0–10.5)
nRBC: 0 % (ref 0.0–0.2)

## 2022-01-22 LAB — I-STAT BETA HCG BLOOD, ED (MC, WL, AP ONLY): I-stat hCG, quantitative: <5 m[IU]/mL (ref <5)

## 2022-01-22 LAB — LIPASE, BLOOD: Lipase: 22 U/L (ref 11–51)

## 2022-01-22 MED ORDER — GENTAMICIN SULFATE 0.1 % EX CREA: 1.0000 "application " | TOPICAL_CREAM | Freq: Every day | CUTANEOUS | Status: DC

## 2022-01-22 MED ORDER — DELFLEX-LC/4.25% DEXTROSE 483 MOSM/L IP SOLN: INTRAPERITONEAL | Status: DC

## 2022-01-22 MED ORDER — SODIUM CHLORIDE 0.9 % IV SOLN: 1.0000 g | Freq: Once | INTRAVENOUS | Status: DC

## 2022-01-22 MED ORDER — DIPHENHYDRAMINE HCL 50 MG/ML IJ SOLN
25.0000 mg | Freq: Once | INTRAMUSCULAR | Status: AC
  Administered 2022-01-22: 25 mg via INTRAVENOUS
  Filled 2022-01-22: qty 1

## 2022-01-22 MED ORDER — HYDROMORPHONE HCL 1 MG/ML IJ SOLN
1.0000 mg | Freq: Once | INTRAMUSCULAR | Status: AC
  Administered 2022-01-22: 1 mg via INTRAVENOUS
  Filled 2022-01-22: qty 1

## 2022-01-22 MED ORDER — DELFLEX-LC/1.5% DEXTROSE 344 MOSM/L IP SOLN: INTRAPERITONEAL | Status: DC

## 2022-01-22 MED ORDER — METOCLOPRAMIDE HCL 5 MG/ML IJ SOLN
10.0000 mg | Freq: Once | INTRAMUSCULAR | Status: AC
  Administered 2022-01-22: 10 mg via INTRAVENOUS
  Filled 2022-01-22: qty 2

## 2022-01-22 MED ORDER — DELFLEX-LC/2.5% DEXTROSE 394 MOSM/L IP SOLN: INTRAPERITONEAL | Status: DC

## 2022-01-22 MED ORDER — DIPHENHYDRAMINE HCL 25 MG PO CAPS
50.0000 mg | ORAL_CAPSULE | Freq: Once | ORAL | Status: AC
  Administered 2022-01-22: 50 mg via ORAL
  Filled 2022-01-22: qty 2

## 2022-01-22 MED ORDER — VANCOMYCIN HCL 1500 MG/300ML IV SOLN
1500.0000 mg | Freq: Once | INTRAVENOUS | Status: AC
  Administered 2022-01-22: 1500 mg via INTRAVENOUS
  Filled 2022-01-22: qty 300

## 2022-01-22 MED ORDER — SODIUM CHLORIDE 0.9 % IV SOLN
2.0000 g | INTRAVENOUS | Status: DC
  Administered 2022-01-22: 2 g via INTRAVENOUS
  Filled 2022-01-22: qty 20

## 2022-01-22 MED ORDER — VANCOMYCIN VARIABLE DOSE PER UNSTABLE RENAL FUNCTION (PHARMACIST DOSING): Status: DC

## 2022-01-22 NOTE — ED Notes (Signed)
Patient reports itching has calmed down but is still there.  MD Regenia Skeeter made aware.

## 2022-01-22 NOTE — ED Notes (Signed)
Patient c/o itching all over.  MD Regenia Skeeter notified.

## 2022-01-22 NOTE — Progress Notes (Addendum)
Brief Nephrology Note  26 yo PD patient here with abdominal pain, n/v/d. Some symptoms have improved but abdominal pain has worsened. Initially was with draining fluid now more persistent. W/u thus far reassuring but does have some degree of leukopenia.  We will send PD fluid for culture. Diagnosis of PD associated peritonitis is a cell count of 100 and/or positive gram stain.  If she is hemodynamically stable she does not have to stay in the hospital to continue treatment if she is found to have peritonitis. Would give IV 1g of ceftazidime and pharmacy recommended dose of vancomycin. Would recommend she skip PD tonight and then undergo regular PD tomorrow night. She should contact her outpatient dialysis center ASAP.  If she is not found to have peritonitis would continue PD as usual and she can contact her outpatient dialysis unit for further instructions.  ADDENDUM 8:53 PM [facility] has started moving supplies out of the dialysis unit as their tenure ends tomorrow and they have already removed necessary supplies (PD catheter cap) to obtain the culture. Therefore we do not have the capability to safely obtain the PD fluid sample.  There is low to moderate likelihood the patient has PD associated peritonitis but out of an abundance of precaution I would provide Ceftaz and Vanc as detailed above, no PD tonight, and she should contact her dialysis unit tomorrow.

## 2022-01-22 NOTE — Progress Notes (Addendum)
Pharmacy Antibiotic Note  Kathy Lewis is a 26 y.o. female admitted on 01/22/2022 presenting with N/V/D and abdominal pain, on peritoneal dialysis, concern for peritonitis.  Pharmacy has been consulted for vancomycin dosing.  Broad spectrum per MD  Plan: Vancomycin 1500 mg IV x 1, then dosing based on vancomycin levels Monitor PD plans, Cx and clinical progression to narrow vancomycin random level in 3 days to guide further dosing     Temp (24hrs), Avg:97.8 F (36.6 C), Min:97.8 F (36.6 C), Max:97.8 F (36.6 C)  Recent Labs  Lab 01/17/22 0514 01/21/22 1435 01/22/22 1647  WBC 3.4* 3.5* 3.4*  CREATININE 8.92* 8.43* 9.18*    Estimated Creatinine Clearance: 7.9 mL/min (A) (by C-G formula based on SCr of 9.18 mg/dL (H)).    Allergies  Allergen Reactions   Cantaloupe Extract Allergy Skin Test Itching    Mouth itching     Strawberry Extract Itching    Mouth itches   Citrullus Vulgaris Itching    Makes mouth itch , ALL melons    Nsaids Other (See Comments)    Avoid per nephrology    Bertis Ruddy, PharmD Clinical Pharmacist ED Pharmacist Phone # [phone] 01/22/2022 9:44 PM

## 2022-01-22 NOTE — ED Triage Notes (Signed)
Pt reports vomiting and diarrhea that started last Sunday.  States diarrhea resolved by Tuesday but started having generalized abd pain with the vomiting on Wednesday.  Last dialysis on Tuesday.

## 2022-01-22 NOTE — ED Provider Notes (Signed)
[facility] EMERGENCY DEPARTMENT Provider Note   CSN: [id number] Arrival date & time: 01/22/22  1605     History  Chief Complaint  Patient presents with   Abdominal Pain    Kathy Lewis is a 26 y.o. female.  HPI 26 year old female presents with continued abdominal pain and vomiting.  She was at [facility] yesterday and was doing better until around midnight. She now has abdominal pain (periumbilical) and recurrent vomiting. About 5 times today. No fevers or chills. No urinary symptoms. Originally this started about 1 week ago, and had diarrhea for a few days but that's resolved. Abdominal pain started about 4 days ago. Originally the abdominal pain only occurred while she was doing peritoneal dialysis, but now it's happening more frequently.   She has a past medical history significant for CHF, ESRD on peritoneal dialysis, type 1 diabetes.  Home Medications Prior to Admission medications   Medication Sig Start Date End Date Taking? Authorizing Provider  atorvastatin (LIPITOR) 40 MG tablet Take 80 mg by mouth daily. 08/03/21  Yes [provider]  azelastine (ASTELIN) 0.1 % nasal spray Place 2 sprays into both nostrils 2 (two) times daily. Use in each nostril as directed Patient taking differently: Place 2 sprays into both nostrils 2 (two) times daily as needed for allergies. Use in each nostril as directed 12/14/21  Yes Hunsucker, Bonna Gains, MD  butalbital-acetaminophen-caffeine [facility]) [phone] MG tablet Take 1 tablet by mouth daily as needed for headache. 02/18/21 02/18/22 Yes Dwyane Dee, MD  calcitRIOL (ROCALTROL) 0.25 MCG capsule Take 0.25 mcg by mouth daily. 08/03/21  Yes [provider]  carvedilol (COREG) 12.5 MG tablet Take 6.125 mg by mouth 2 (two) times daily with a meal.   Yes [provider]  ENTRESTO 49-51 MG Take 1 tablet by mouth 2 (two) times daily. 12/23/21  Yes [provider]  FEROSUL 325 (65 Fe) MG  tablet Take 325 mg by mouth 3 (three) times daily. 04/30/21  Yes [provider]  hydrALAZINE (APRESOLINE) 100 MG tablet Take 100 mg by mouth 3 (three) times daily. 08/05/21  Yes [provider]  insulin lispro (HUMALOG) 100 UNIT/ML injection 0-10 Units as directed. Sliding Scale 1 unit per 8 carbs, 1/2 unit per hour 01/08/22  Yes [provider]  isosorbide mononitrate (IMDUR) 30 MG 24 hr tablet Take 30 mg by mouth daily.   Yes [provider]  magnesium oxide (MAG-OX) 400 MG tablet Take 800 mg by mouth daily. 08/03/21  Yes [provider]  metoCLOPramide (REGLAN) 10 MG tablet Take 1 tablet (10 mg total) by mouth every 8 (eight) hours as needed for nausea. 01/17/22  Yes Lucrezia Starch, MD  prochlorperazine (COMPAZINE) 5 MG tablet Take 1 tablet (5 mg total) by mouth every 6 (six) hours as needed for nausea or vomiting. 01/21/22  Yes Regan Lemming, MD  Vitamin D, Ergocalciferol, (DRISDOL) 1.25 MG (50000 UNIT) CAPS capsule Take 50,000 Units by mouth every Sunday.   Yes [provider]  fluticasone (FLONASE) 50 MCG/ACT nasal spray Place 1 spray into both nostrils in the morning and at bedtime. Patient not taking: Reported on 01/22/2022 12/14/21   Hunsucker, Bonna Gains, MD  gentamicin cream (GARAMYCIN) 0.1 % Apply 1 application topically daily. Apply to exit site. Patient not taking: Reported on 01/22/2022 08/12/21   Mendel Corning, MD  Insulin Syringe-Needle U-100 (INSULIN SYRINGE .3CC/31GX5/16") 31G X 5/16" 0.3 ML MISC Use as directed 10/27/19   [name],  MD  medroxyPROGESTERone (DEPO-PROVERA) 150 MG/ML injection Inject 1 mL (150 mg total) into the muscle every 3 (three) months. Patient not taking: Reported on 01/22/2022 03/29/21   Gavin Pound, CNM  escitalopram (LEXAPRO) 10 MG tablet Take 20 mg by mouth daily.  05/10/20 10/06/20  [provider]  furosemide (LASIX) 40 MG tablet Take 1 tablet (40 mg total) by mouth daily. Patient not taking:  No sig reported 10/28/19 02/17/21  [name], MD  lisinopril (ZESTRIL) 10 MG tablet Take 10 mg by mouth daily.  02/23/20 10/06/20  [provider]  spironolactone (ALDACTONE) 25 MG tablet Take 25 mg by mouth daily.  04/20/20 10/06/20  [provider]      Allergies    Cantaloupe extract allergy skin test, Strawberry extract, Citrullus vulgaris, and Nsaids    Review of Systems   Review of Systems  Constitutional:  Negative for chills and fever.  Respiratory:  Negative for shortness of breath.   Cardiovascular:  Negative for chest pain.  Gastrointestinal:  Positive for abdominal pain and vomiting. Negative for diarrhea.  Genitourinary:  Negative for dysuria.   Physical Exam Updated Vital Signs BP (!) 139/113   Pulse 92   Temp 97.8 F (36.6 C) (Oral)   Resp 20   SpO2 99%  Physical Exam Vitals and nursing note reviewed.  Constitutional:      General: She is not in acute distress.    Appearance: She is well-developed. She is not ill-appearing or diaphoretic.  HENT:     Head: Normocephalic and atraumatic.  Cardiovascular:     Rate and Rhythm: Normal rate and regular rhythm.     Heart sounds: Normal heart sounds.  Pulmonary:     Effort: Pulmonary effort is normal.     Breath sounds: Normal breath sounds.  Abdominal:     Palpations: Abdomen is soft.     Tenderness: There is abdominal tenderness in the right upper quadrant and left upper quadrant. There is no guarding.     Comments: Peritoneal dialysis catheter in place in RUQ  Skin:    General: Skin is warm and dry.  Neurological:     Mental Status: She is alert.    ED Results / Procedures / Treatments   Labs (all labs ordered are listed, but only abnormal results are displayed) Labs Reviewed  COMPREHENSIVE METABOLIC PANEL - Abnormal; Notable for the following components:      Result Value   Glucose, Bld 117 (*)    BUN 61 (*)    Creatinine, Ser 9.18 (*)    Calcium 8.1 (*)    Total Protein 5.3 (*)     Albumin 2.5 (*)    GFR, Estimated 6 (*)    All other components within normal limits  CBC - Abnormal; Notable for the following components:   WBC 3.4 (*)    RBC 3.41 (*)    Hemoglobin 10.7 (*)    HCT 34.1 (*)    All other components within normal limits  BODY FLUID CULTURE W GRAM STAIN  LIPASE, BLOOD  URINALYSIS, ROUTINE W REFLEX MICROSCOPIC  BODY FLUID CELL COUNT WITH DIFFERENTIAL  I-STAT BETA HCG BLOOD, ED ([facility], [facility], [facility] ONLY)    EKG EKG Interpretation  Date/Time:  Sunday Jan 22 2022 16:42:28 EDT Ventricular Rate:  90 PR Interval:  140 QRS Duration: 91 QT Interval:  404 QTC Calculation: 495 R Axis:   50 Text Interpretation: Sinus rhythm Probable left atrial enlargement Low voltage, precordial leads Abnormal Q suggests anterior  infarct Borderline repolarization abnormality Borderline prolonged QT interval Nonspecific T wave abnormality Confirmed by Ezequiel Essex [phone]) on 01/22/2022 4:47:44 PM  Radiology CT ABDOMEN PELVIS WO CONTRAST  Result Date: 01/21/2022 CLINICAL DATA:  Abdominal pain.  Vomiting for 7 days. EXAM: CT ABDOMEN AND PELVIS WITHOUT CONTRAST TECHNIQUE: Multidetector CT imaging of the abdomen and pelvis was performed following the standard protocol without IV contrast. RADIATION DOSE REDUCTION: This exam was performed according to the departmental dose-optimization program which includes automated exposure control, adjustment of the mA and/or kV according to patient size and/or use of iterative reconstruction technique. COMPARISON:  10/14/2017. FINDINGS: Lower chest: Heart mildly enlarged. Small pericardial effusion. Interstitial thickening at the lung bases. Mild dependent atelectasis at the left lung base. These findings are new from the prior CT. Hepatobiliary: No focal liver abnormality is seen. No gallstones, gallbladder wall thickening, or biliary dilatation. Pancreas: Pancreatic atrophy.  No mass or inflammation. Spleen: Normal in size without focal abnormality.  Adrenals/Urinary Tract: No adrenal masses. Kidneys normal in size, orientation and position. No renal mass, stone or hydronephrosis. Normal ureters. Unremarkable bladder. Stomach/Bowel: Normal stomach. Small bowel and colon are normal in caliber. No wall thickening. No convincing inflammation. Vascular/Lymphatic: No significant vascular findings are present. No enlarged abdominal or pelvic lymph nodes. Reproductive: Uterus and bilateral adnexa are unremarkable. Other: Small amount of ascites, new since the prior CT. Peritoneal dialysis catheter curls in the lower pelvis. Diffuse subcutaneous edema. Musculoskeletal: No acute or significant osseous findings. IMPRESSION: 1. No acute findings. No evidence of bowel obstruction or inflammation. 2. Mild cardiomegaly, small pericardial effusion, small amount of ascites and diffuse subcutaneous soft tissue edema, new since the prior CT. Electronically Signed   By: Lajean Manes M.D.   On: 01/21/2022 16:04    Procedures Procedures    Medications Ordered in ED Medications  dialysis solution 1.5% low-MG/low-CA dianeal solution (has no administration in time range)  dialysis solution 2.5% low-MG/low-CA dianeal solution (has no administration in time range)  dialysis solution 4.25% low-MG/low-CA dianeal solution (has no administration in time range)  gentamicin cream (GARAMYCIN) 0.1 % 1 application. (has no administration in time range)  vancomycin (VANCOREADY) IVPB 1500 mg/300 mL (1,500 mg Intravenous New Bag/Given 01/22/22 2230)  cefTRIAXone (ROCEPHIN) 2 g in sodium chloride 0.9 % 100 mL IVPB (0 g Intravenous Stopped 01/22/22 2228)  vancomycin variable dose per unstable renal function (pharmacist dosing) (has no administration in time range)  metoCLOPramide (REGLAN) injection 10 mg (10 mg Intravenous Given 01/22/22 1654)  HYDROmorphone (DILAUDID) injection 1 mg (1 mg Intravenous Given 01/22/22 1654)  diphenhydrAMINE (BENADRYL) injection 25 mg (25 mg Intravenous  Given 01/22/22 1654)  diphenhydrAMINE (BENADRYL) capsule 50 mg (50 mg Oral Given 01/22/22 1931)    ED Course/ Medical Decision Making/ A&P Clinical Course as of 01/22/22 2341  Sun Jan 22, 2022  1700 Discussed with Dr. Joylene Grapes.  He will have a nurse come by to draw fluid to evaluate for peritonitis.  This will be around 6 PM.  We will give pain/nausea control and evaluate lab work until then.  I do not think repeat CT is needed and I have reviewed the images and report from yesterday. [SG]    Clinical Course User Index [SG] Sherwood Gambler, MD                           Medical Decision Making Amount and/or Complexity of Data Reviewed External Data Reviewed: notes. Labs: ordered.  Risk Prescription  drug management.   Patient was given IV pain control as well as oral Benadryl for continued itching.  No vomiting while in the emergency department.  Does have some generalized abdominal discomfort, especially upper.  I have reviewed the chart as well as most recent work-ups.  Discussed with Dr. Joylene Grapes as above.  Unfortunately due to the holiday weekend and technical issues we are unable to get dialysate sample.  The right adapters are not available apparently.  Because she is stable, Dr. Joylene Grapes advises IV ceftazidime and IV vancomycin which I have ordered and then no peritoneal dialysis until seeing her nephrologist/dialysis center. They can get a sample there. She has been advised of this.  She appears stable for discharge home.  Leukopenia and other labs are near baseline.        Final Clinical Impression(s) / ED Diagnoses Final diagnoses:  Generalized abdominal pain    Rx / DC Orders ED Discharge Orders     None         Sherwood Gambler, MD 01/22/22 [phone]

## 2022-01-22 NOTE — Discharge Instructions (Addendum)
Do NOT do peritoneal dialysis until you talk to your nephrology/kidney team.  You were given antibiotics in case you have an abdominal infection.  If at any point you develop fever, new or worsening abdominal pain, vomiting, or any other new/concerning symptoms then return to the ER for evaluation.

## 2022-01-24 ENCOUNTER — Other Ambulatory Visit: Payer: Self-pay

## 2022-01-24 ENCOUNTER — Emergency Department (HOSPITAL_COMMUNITY): Payer: Medicaid Other

## 2022-01-24 ENCOUNTER — Inpatient Hospital Stay (HOSPITAL_COMMUNITY)
Admission: EM | Admit: 2022-01-24 | Discharge: 2022-01-25 | DRG: 073 | Disposition: A | Discharge status: Home / Self Care | Payer: Medicaid Other | Attending: Internal Medicine | Admitting: Internal Medicine

## 2022-01-24 ENCOUNTER — Encounter (HOSPITAL_COMMUNITY): Payer: Self-pay

## 2022-01-24 DIAGNOSIS — K3184 Gastroparesis: Secondary | ICD-10-CM

## 2022-01-24 DIAGNOSIS — E1043 Type 1 diabetes mellitus with diabetic autonomic (poly)neuropathy: Secondary | ICD-10-CM | POA: Diagnosis present

## 2022-01-24 DIAGNOSIS — E1022 Type 1 diabetes mellitus with diabetic chronic kidney disease: Secondary | ICD-10-CM | POA: Diagnosis present

## 2022-01-24 DIAGNOSIS — Z91018 Allergy to other foods: Secondary | ICD-10-CM

## 2022-01-24 DIAGNOSIS — I10 Essential (primary) hypertension: Secondary | ICD-10-CM | POA: Diagnosis present

## 2022-01-24 DIAGNOSIS — Z886 Allergy status to analgesic agent status: Secondary | ICD-10-CM

## 2022-01-24 DIAGNOSIS — M898X9 Other specified disorders of bone, unspecified site: Secondary | ICD-10-CM | POA: Diagnosis present

## 2022-01-24 DIAGNOSIS — O903 Peripartum cardiomyopathy: Secondary | ICD-10-CM | POA: Diagnosis present

## 2022-01-24 DIAGNOSIS — I132 Hypertensive heart and chronic kidney disease with heart failure and with stage 5 chronic kidney disease, or end stage renal disease: Secondary | ICD-10-CM | POA: Diagnosis present

## 2022-01-24 DIAGNOSIS — D631 Anemia in chronic kidney disease: Secondary | ICD-10-CM | POA: Diagnosis present

## 2022-01-24 DIAGNOSIS — Z79899 Other long term (current) drug therapy: Secondary | ICD-10-CM | POA: Diagnosis not present

## 2022-01-24 DIAGNOSIS — K652 Spontaneous bacterial peritonitis: Secondary | ICD-10-CM | POA: Diagnosis present

## 2022-01-24 DIAGNOSIS — E1065 Type 1 diabetes mellitus with hyperglycemia: Secondary | ICD-10-CM | POA: Diagnosis present

## 2022-01-24 DIAGNOSIS — R112 Nausea with vomiting, unspecified: Secondary | ICD-10-CM

## 2022-01-24 DIAGNOSIS — I5022 Chronic systolic (congestive) heart failure: Secondary | ICD-10-CM | POA: Diagnosis present

## 2022-01-24 DIAGNOSIS — Z794 Long term (current) use of insulin: Secondary | ICD-10-CM | POA: Diagnosis not present

## 2022-01-24 DIAGNOSIS — R1010 Upper abdominal pain, unspecified: Principal | ICD-10-CM

## 2022-01-24 DIAGNOSIS — Z87891 Personal history of nicotine dependence: Secondary | ICD-10-CM

## 2022-01-24 DIAGNOSIS — R1110 Vomiting, unspecified: Secondary | ICD-10-CM | POA: Diagnosis present

## 2022-01-24 DIAGNOSIS — E785 Hyperlipidemia, unspecified: Secondary | ICD-10-CM | POA: Diagnosis present

## 2022-01-24 DIAGNOSIS — N186 End stage renal disease: Secondary | ICD-10-CM | POA: Diagnosis present

## 2022-01-24 DIAGNOSIS — Z9641 Presence of insulin pump (external) (internal): Secondary | ICD-10-CM | POA: Diagnosis present

## 2022-01-24 DIAGNOSIS — Z992 Dependence on renal dialysis: Secondary | ICD-10-CM

## 2022-01-24 DIAGNOSIS — R9431 Abnormal electrocardiogram [ECG] [EKG]: Secondary | ICD-10-CM | POA: Diagnosis present

## 2022-01-24 DIAGNOSIS — R109 Unspecified abdominal pain: Secondary | ICD-10-CM

## 2022-01-24 HISTORY — DX: Chronic systolic (congestive) heart failure: I50.22

## 2022-01-24 HISTORY — DX: End stage renal disease: N18.6

## 2022-01-24 HISTORY — DX: Dependence on renal dialysis: Z99.2

## 2022-01-24 LAB — CBC WITH DIFFERENTIAL/PLATELET
Abs Immature Granulocytes: 0.03 10*3/uL (ref 0.00–0.07)
Basophils Absolute: 0 10*3/uL (ref 0.0–0.1)
Basophils Relative: 1 %
Eosinophils Absolute: 0 10*3/uL (ref 0.0–0.5)
Eosinophils Relative: 1 %
HCT: 30.9 % — ABNORMAL LOW (ref 36.0–46.0)
Hemoglobin: 10.1 g/dL — ABNORMAL LOW (ref 12.0–15.0)
Immature Granulocytes: 1 %
Lymphocytes Relative: 14 %
Lymphs Abs: 0.5 10*3/uL — ABNORMAL LOW (ref 0.7–4.0)
MCH: 31.8 pg (ref 26.0–34.0)
MCHC: 32.7 g/dL (ref 30.0–36.0)
MCV: 97.2 fL (ref 80.0–100.0)
Monocytes Absolute: 0.1 10*3/uL (ref 0.1–1.0)
Monocytes Relative: 3 %
Neutro Abs: 2.7 10*3/uL (ref 1.7–7.7)
Neutrophils Relative %: 80 %
Platelets: 208 10*3/uL (ref 150–400)
RBC: 3.18 MIL/uL — ABNORMAL LOW (ref 3.87–5.11)
RDW: 13.5 % (ref 11.5–15.5)
WBC: 3.4 10*3/uL — ABNORMAL LOW (ref 4.0–10.5)
nRBC: 0 % (ref 0.0–0.2)

## 2022-01-24 LAB — LIPASE, BLOOD: Lipase: 22 U/L (ref 11–51)

## 2022-01-24 LAB — C DIFFICILE QUICK SCREEN W PCR REFLEX
C Diff antigen: NEGATIVE
C Diff interpretation: NOT DETECTED
C Diff toxin: NEGATIVE

## 2022-01-24 LAB — COMPREHENSIVE METABOLIC PANEL
ALT: 16 U/L (ref 0–44)
AST: 16 U/L (ref 15–41)
Albumin: 2.5 g/dL — ABNORMAL LOW (ref 3.5–5.0)
Alkaline Phosphatase: 58 U/L (ref 38–126)
Anion gap: 11 (ref 5–15)
BUN: 62 mg/dL — ABNORMAL HIGH (ref 6–20)
CO2: 20 mmol/L — ABNORMAL LOW (ref 22–32)
Calcium: 8.1 mg/dL — ABNORMAL LOW (ref 8.9–10.3)
Chloride: 106 mmol/L (ref 98–111)
Creatinine, Ser: 9.63 mg/dL — ABNORMAL HIGH (ref 0.44–1.00)
GFR, Estimated: 5 mL/min — ABNORMAL LOW (ref >60)
Glucose, Bld: 176 mg/dL — ABNORMAL HIGH (ref 70–99)
Potassium: 5 mmol/L (ref 3.5–5.1)
Sodium: 137 mmol/L (ref 135–145)
Total Bilirubin: 0.6 mg/dL (ref 0.3–1.2)
Total Protein: 5.3 g/dL — ABNORMAL LOW (ref 6.5–8.1)

## 2022-01-24 LAB — GLUCOSE, CAPILLARY
Glucose-Capillary: 220 mg/dL — ABNORMAL HIGH (ref 70–99)
Glucose-Capillary: 300 mg/dL — ABNORMAL HIGH (ref 70–99)

## 2022-01-24 LAB — GRAM STAIN: Gram Stain: NONE SEEN

## 2022-01-24 LAB — CBG MONITORING, ED: Glucose-Capillary: 245 mg/dL — ABNORMAL HIGH (ref 70–99)

## 2022-01-24 MED ORDER — ACETAMINOPHEN 650 MG RE SUPP: 650.0000 mg | Freq: Four times a day (QID) | RECTAL | Status: DC | PRN

## 2022-01-24 MED ORDER — MAGNESIUM OXIDE -MG SUPPLEMENT 400 (240 MG) MG PO TABS
800.0000 mg | ORAL_TABLET | Freq: Every day | ORAL | Status: DC
  Administered 2022-01-24 – 2022-01-25 (×2): 800 mg via ORAL
  Filled 2022-01-24 (×3): qty 2

## 2022-01-24 MED ORDER — CAMPHOR-MENTHOL 0.5-0.5 % EX LOTN
1.0000 "application " | TOPICAL_LOTION | Freq: Three times a day (TID) | CUTANEOUS | Status: DC | PRN
  Filled 2022-01-24: qty 222

## 2022-01-24 MED ORDER — GENTAMICIN SULFATE 0.1 % EX CREA: 1.0000 "application " | TOPICAL_CREAM | Freq: Every day | CUTANEOUS | Status: DC

## 2022-01-24 MED ORDER — GENTAMICIN SULFATE 0.1 % EX CREA
1.0000 "application " | TOPICAL_CREAM | Freq: Every day | CUTANEOUS | Status: DC
  Filled 2022-01-24: qty 15

## 2022-01-24 MED ORDER — CARVEDILOL 6.25 MG PO TABS
6.1250 mg | ORAL_TABLET | Freq: Two times a day (BID) | ORAL | Status: DC
  Administered 2022-01-24 – 2022-01-25 (×2): 6.25 mg via ORAL
  Filled 2022-01-24 (×3): qty 1

## 2022-01-24 MED ORDER — DIPHENHYDRAMINE HCL 50 MG/ML IJ SOLN
12.5000 mg | Freq: Once | INTRAMUSCULAR | Status: AC
Start: 2022-01-24 — End: 2022-01-24
  Administered 2022-01-24: 12.5 mg via INTRAVENOUS
  Filled 2022-01-24: qty 1

## 2022-01-24 MED ORDER — SORBITOL 70 % SOLN
30.0000 mL | Status: DC | PRN
  Filled 2022-01-24: qty 30

## 2022-01-24 MED ORDER — PROCHLORPERAZINE MALEATE 5 MG PO TABS: 5.0000 mg | ORAL_TABLET | Freq: Four times a day (QID) | ORAL | Status: DC | PRN

## 2022-01-24 MED ORDER — HYDRALAZINE HCL 25 MG PO TABS
100.0000 mg | ORAL_TABLET | Freq: Three times a day (TID) | ORAL | Status: DC
  Administered 2022-01-24 – 2022-01-25 (×3): 100 mg via ORAL
  Filled 2022-01-24 (×3): qty 4

## 2022-01-24 MED ORDER — HEPARIN SODIUM (PORCINE) 5000 UNIT/ML IJ SOLN
5000.0000 [IU] | Freq: Three times a day (TID) | INTRAMUSCULAR | Status: DC
  Administered 2022-01-24: 5000 [IU] via SUBCUTANEOUS
  Filled 2022-01-24 (×2): qty 1

## 2022-01-24 MED ORDER — NALOXONE HCL 0.4 MG/ML IJ SOLN: 0.2000 mg | Freq: Once | INTRAMUSCULAR | Status: DC

## 2022-01-24 MED ORDER — NALOXONE HCL 0.4 MG/ML IJ SOLN
0.2000 mg | Freq: Once | INTRAMUSCULAR | Status: AC
  Administered 2022-01-24: 0.2 mg via INTRAVENOUS
  Filled 2022-01-24: qty 1

## 2022-01-24 MED ORDER — ACETAMINOPHEN 325 MG PO TABS
650.0000 mg | ORAL_TABLET | Freq: Four times a day (QID) | ORAL | Status: DC | PRN
  Administered 2022-01-24 – 2022-01-25 (×2): 650 mg via ORAL
  Filled 2022-01-24 (×2): qty 2

## 2022-01-24 MED ORDER — CALCIUM CARBONATE ANTACID 1250 MG/5ML PO SUSP
500.0000 mg | Freq: Four times a day (QID) | ORAL | Status: DC | PRN
  Filled 2022-01-24: qty 5

## 2022-01-24 MED ORDER — SODIUM CHLORIDE 0.9% FLUSH
3.0000 mL | Freq: Two times a day (BID) | INTRAVENOUS | Status: DC
Start: 2022-01-24 — End: 2022-01-25
  Administered 2022-01-24 – 2022-01-25 (×2): 3 mL via INTRAVENOUS

## 2022-01-24 MED ORDER — DOCUSATE SODIUM 283 MG RE ENEM
1.0000 | ENEMA | RECTAL | Status: DC | PRN
  Filled 2022-01-24: qty 1

## 2022-01-24 MED ORDER — SACUBITRIL-VALSARTAN 49-51 MG PO TABS
1.0000 | ORAL_TABLET | Freq: Two times a day (BID) | ORAL | Status: DC
  Administered 2022-01-24 – 2022-01-25 (×2): 1 via ORAL
  Filled 2022-01-24 (×4): qty 1

## 2022-01-24 MED ORDER — METOCLOPRAMIDE HCL 5 MG/ML IJ SOLN
10.0000 mg | Freq: Once | INTRAMUSCULAR | Status: AC
Start: 2022-01-24 — End: 2022-01-24
  Administered 2022-01-24: 10 mg via INTRAVENOUS
  Filled 2022-01-24: qty 2

## 2022-01-24 MED ORDER — FERROUS SULFATE 325 (65 FE) MG PO TABS
325.0000 mg | ORAL_TABLET | Freq: Three times a day (TID) | ORAL | Status: DC
  Administered 2022-01-24 – 2022-01-25 (×3): 325 mg via ORAL
  Filled 2022-01-24 (×3): qty 1

## 2022-01-24 MED ORDER — HYDROXYZINE HCL 25 MG PO TABS: 25.0000 mg | ORAL_TABLET | Freq: Three times a day (TID) | ORAL | Status: DC | PRN

## 2022-01-24 MED ORDER — HYDRALAZINE HCL 20 MG/ML IJ SOLN: 5.0000 mg | INTRAMUSCULAR | Status: DC | PRN

## 2022-01-24 MED ORDER — SUCRALFATE 1 G PO TABS
1.0000 g | ORAL_TABLET | Freq: Three times a day (TID) | ORAL | Status: DC
  Administered 2022-01-24 – 2022-01-25 (×4): 1 g via ORAL
  Filled 2022-01-24 (×4): qty 1

## 2022-01-24 MED ORDER — PANTOPRAZOLE SODIUM 40 MG IV SOLR
40.0000 mg | INTRAVENOUS | Status: DC
  Administered 2022-01-24: 40 mg via INTRAVENOUS
  Filled 2022-01-24: qty 10

## 2022-01-24 MED ORDER — ISOSORBIDE MONONITRATE ER 30 MG PO TB24
30.0000 mg | ORAL_TABLET | Freq: Every day | ORAL | Status: DC
  Administered 2022-01-24 – 2022-01-25 (×2): 30 mg via ORAL
  Filled 2022-01-24 (×2): qty 1

## 2022-01-24 MED ORDER — NEPRO/CARBSTEADY PO LIQD
237.0000 mL | Freq: Three times a day (TID) | ORAL | Status: DC | PRN
Start: 2022-01-24 — End: 2022-01-25
  Filled 2022-01-24: qty 237

## 2022-01-24 MED ORDER — ATORVASTATIN CALCIUM 80 MG PO TABS
80.0000 mg | ORAL_TABLET | Freq: Every day | ORAL | Status: DC
  Administered 2022-01-24 – 2022-01-25 (×2): 80 mg via ORAL
  Filled 2022-01-24 (×2): qty 1

## 2022-01-24 MED ORDER — INSULIN ASPART 100 UNIT/ML IJ SOLN
0.0000 [IU] | INTRAMUSCULAR | Status: DC
  Administered 2022-01-24: 2 [IU] via SUBCUTANEOUS
  Administered 2022-01-24: 3 [IU] via SUBCUTANEOUS
  Administered 2022-01-25: 2 [IU] via SUBCUTANEOUS
  Administered 2022-01-25: 3 [IU] via SUBCUTANEOUS
  Administered 2022-01-25: 2 [IU] via SUBCUTANEOUS
  Administered 2022-01-25: 1 [IU] via SUBCUTANEOUS

## 2022-01-24 MED ORDER — CALCITRIOL 0.25 MCG PO CAPS
0.2500 ug | ORAL_CAPSULE | Freq: Every day | ORAL | Status: DC
  Administered 2022-01-24 – 2022-01-25 (×2): 0.25 ug via ORAL
  Filled 2022-01-24 (×3): qty 1

## 2022-01-24 MED ORDER — INSULIN PUMP
Freq: Three times a day (TID) | SUBCUTANEOUS | Status: DC
  Filled 2022-01-24: qty 1

## 2022-01-24 MED ORDER — METOCLOPRAMIDE HCL 5 MG PO TABS
10.0000 mg | ORAL_TABLET | Freq: Three times a day (TID) | ORAL | Status: DC | PRN
Start: 2022-01-24 — End: 2022-01-25
  Administered 2022-01-24: 10 mg via ORAL
  Filled 2022-01-24: qty 2

## 2022-01-24 MED ORDER — ZOLPIDEM TARTRATE 5 MG PO TABS
5.0000 mg | ORAL_TABLET | Freq: Every evening | ORAL | Status: DC | PRN
  Administered 2022-01-24: 5 mg via ORAL
  Filled 2022-01-24: qty 1

## 2022-01-24 MED ORDER — HYDROMORPHONE HCL 1 MG/ML IJ SOLN
1.0000 mg | Freq: Once | INTRAMUSCULAR | Status: AC
  Administered 2022-01-24: 1 mg via INTRAVENOUS
  Filled 2022-01-24: qty 1

## 2022-01-24 MED ORDER — FLUTICASONE PROPIONATE 50 MCG/ACT NA SUSP
1.0000 | Freq: Two times a day (BID) | NASAL | Status: DC
  Administered 2022-01-24 – 2022-01-25 (×2): 1 via NASAL
  Filled 2022-01-24: qty 16

## 2022-01-24 MED ORDER — DELFLEX-LC/2.5% DEXTROSE 394 MOSM/L IP SOLN: INTRAPERITONEAL | Status: AC

## 2022-01-24 MED ORDER — DELFLEX-LC/1.5% DEXTROSE 344 MOSM/L IP SOLN: Freq: Once | INTRAPERITONEAL | Status: DC

## 2022-01-24 NOTE — ED Provider Notes (Signed)
[place] EMERGENCY DEPARTMENT Provider Note   CSN: [id number] Arrival date & time: 01/24/22  7096     History  Chief Complaint  Patient presents with   Abdominal Pain    Kathy Lewis is a 26 y.o. female with history of anxiety, bipolar 2 disorder, CHF, CKD, diabetes.  Patient presents ED for evaluation of abdominal pain.  Patient is been seen in the ED 3 times in the past 7 days for same complaint.  Patient reports that the abdominal pain started 1 week ago with subsequent diarrhea a few days following that has resolved.  Patient reports that the abdominal pain used to only be during peritoneal dialysis sessions however more recently the abdominal pain has been constant.  Patient reports the abdominal pain is located in her upper quadrants.  On 5/28, patient was seen in the ED and there was discussion about whether or not this patient was peritonitic.  Due to technical issues, inability to access necessary supplies, peritoneal dialysis culture was never obtained.  Patient was provided with ceftriaxone, vancomycin and cefepime for suspected peritonitis and discharged. Patient was advised to follow-up with nephrologist/dialysis center for further management.  Patient reports that she followed up with her nephrologist who advised her to take laxatives.  Patient states that she did not have PD session the night of 5/28, only had a half a session last night on 5/29. Patient states she attempted to take her prescribed promethazine PTA with no resolution of symptoms. Patient endorsing nausea, vomiting, abdominal pain, diarrhea.  Patient denies any fevers, blood in stool, blood in vomit, body aches or chills, dysuria.   Abdominal Pain Associated symptoms: diarrhea, nausea and vomiting   Associated symptoms: no chest pain, no chills, no fever and no shortness of breath       Home Medications Prior to Admission medications   Medication Sig Start Date End Date Taking? Authorizing  Provider  atorvastatin (LIPITOR) 40 MG tablet Take 80 mg by mouth daily. 08/03/21  Yes [provider]  azelastine (ASTELIN) 0.1 % nasal spray Place 2 sprays into both nostrils 2 (two) times daily. Use in each nostril as directed Patient taking differently: Place 2 sprays into both nostrils 2 (two) times daily as needed for allergies. Use in each nostril as directed 12/14/21  Yes Hunsucker, Bonna Gains, MD  butalbital-acetaminophen-caffeine [facility]) [phone] MG tablet Take 1 tablet by mouth daily as needed for headache. 02/18/21 02/18/22 Yes Dwyane Dee, MD  calcitRIOL (ROCALTROL) 0.25 MCG capsule Take 0.25 mcg by mouth daily. 08/03/21  Yes [provider]  carvedilol (COREG) 12.5 MG tablet Take 6.125 mg by mouth 2 (two) times daily with a meal.   Yes [provider]  ENTRESTO 49-51 MG Take 1 tablet by mouth 2 (two) times daily. 12/23/21  Yes [provider]  FEROSUL 325 (65 Fe) MG tablet Take 325 mg by mouth 3 (three) times daily. 04/30/21  Yes [provider]  fluticasone (FLONASE) 50 MCG/ACT nasal spray Place 1 spray into both nostrils in the morning and at bedtime. 12/14/21  Yes Hunsucker, Bonna Gains, MD  hydrALAZINE (APRESOLINE) 100 MG tablet Take 100 mg by mouth 3 (three) times daily. 08/05/21  Yes [provider]  insulin lispro (HUMALOG) 100 UNIT/ML injection 0-10 Units as directed. Sliding Scale 1 unit per 8 carbs, 1/2 unit per hour 01/08/22  Yes [provider]  Insulin Syringe-Needle U-100 (INSULIN SYRINGE .3CC/31GX5/16") 31G X 5/16" 0.3 ML MISC Use as directed 10/27/19  Yes Varnado,  Estill Bamberg, MD  isosorbide mononitrate (IMDUR) 30 MG 24 hr tablet Take 30 mg by mouth daily.   Yes [provider]  magnesium oxide (MAG-OX) 400 MG tablet Take 800 mg by mouth daily. 08/03/21  Yes [provider]  medroxyPROGESTERone (DEPO-PROVERA) 150 MG/ML injection Inject 1 mL (150 mg total) into the muscle every 3 (three) months. 03/29/21  Yes  Gavin Pound, CNM  metoCLOPramide (REGLAN) 10 MG tablet Take 1 tablet (10 mg total) by mouth every 8 (eight) hours as needed for nausea. 01/17/22  Yes Lucrezia Starch, MD  prochlorperazine (COMPAZINE) 5 MG tablet Take 1 tablet (5 mg total) by mouth every 6 (six) hours as needed for nausea or vomiting. 01/21/22  Yes Regan Lemming, MD  Vitamin D, Ergocalciferol, (DRISDOL) 1.25 MG (50000 UNIT) CAPS capsule Take 50,000 Units by mouth every Sunday.   Yes [provider]  gentamicin cream (GARAMYCIN) 0.1 % Apply 1 application topically daily. Apply to exit site. Patient not taking: Reported on 01/24/2022 08/12/21   Rai, Vernelle Emerald, MD  escitalopram (LEXAPRO) 10 MG tablet Take 20 mg by mouth daily.  05/10/20 10/06/20  [provider]  furosemide (LASIX) 40 MG tablet Take 1 tablet (40 mg total) by mouth daily. Patient not taking: No sig reported 10/28/19 02/17/21  [name], MD  lisinopril (ZESTRIL) 10 MG tablet Take 10 mg by mouth daily.  02/23/20 10/06/20  [provider]  spironolactone (ALDACTONE) 25 MG tablet Take 25 mg by mouth daily.  04/20/20 10/06/20  [provider]      Allergies    Cantaloupe extract allergy skin test, Strawberry extract, Citrullus vulgaris, and Nsaids    Review of Systems   Review of Systems  Constitutional:  Negative for chills and fever.  Respiratory:  Negative for shortness of breath.   Cardiovascular:  Negative for chest pain.  Gastrointestinal:  Positive for abdominal pain, diarrhea, nausea and vomiting.  All other systems reviewed and are negative.  Physical Exam Updated Vital Signs BP (!) 142/114   Pulse 86   Temp (!) 97.5 F (36.4 C) (Oral)   Resp 19   SpO2 99%  Physical Exam Vitals and nursing note reviewed.  Constitutional:      General: She is not in acute distress.    Appearance: She is ill-appearing. She is not toxic-appearing or diaphoretic.  HENT:     Head: Normocephalic and atraumatic.     Mouth/Throat:      Mouth: Mucous membranes are moist.     Pharynx: Oropharynx is clear.  Eyes:     Extraocular Movements: Extraocular movements intact.     Pupils: Pupils are equal, round, and reactive to light.  Cardiovascular:     Rate and Rhythm: Regular rhythm. Tachycardia present.  Pulmonary:     Effort: Pulmonary effort is normal. No respiratory distress.     Breath sounds: Normal breath sounds. No wheezing.  Abdominal:     General: Abdomen is flat. Bowel sounds are normal.     Palpations: Abdomen is rigid.     Tenderness: There is abdominal tenderness in the right upper quadrant, epigastric area, periumbilical area and left upper quadrant.     Comments: PD cath present in RUQ  Musculoskeletal:     Cervical back: Normal range of motion and neck supple.  Skin:    General: Skin is warm and dry.     Capillary Refill: Capillary refill takes less than 2 seconds.  Neurological:     Mental Status: She is  alert and oriented to person, place, and time.    ED Results / Procedures / Treatments   Labs (all labs ordered are listed, but only abnormal results are displayed) Labs Reviewed  COMPREHENSIVE METABOLIC PANEL - Abnormal; Notable for the following components:      Result Value   CO2 20 (*)    Glucose, Bld 176 (*)    BUN 62 (*)    Creatinine, Ser 9.63 (*)    Calcium 8.1 (*)    Total Protein 5.3 (*)    Albumin 2.5 (*)    GFR, Estimated 5 (*)    All other components within normal limits  CBC WITH DIFFERENTIAL/PLATELET - Abnormal; Notable for the following components:   WBC 3.4 (*)    RBC 3.18 (*)    Hemoglobin 10.1 (*)    HCT 30.9 (*)    Lymphs Abs 0.5 (*)    All other components within normal limits  LIPASE, BLOOD  URINALYSIS, ROUTINE W REFLEX MICROSCOPIC    EKG EKG Interpretation  Date/Time:  Tuesday Jan 24 2022 08:11:54 EDT Ventricular Rate:  110 PR Interval:  146 QRS Duration: 97 QT Interval:  382 QTC Calculation: 517 R Axis:   77 Text Interpretation: Sinus tachycardia  Probable left atrial enlargement Low voltage with right axis deviation Prolonged QT interval Confirmed by Octaviano Glow [phone]) on 01/24/2022 8:18:03 AM  Radiology DG Chest Portable 1 View  Result Date: 01/24/2022 CLINICAL DATA:  Nausea, diarrhea, vomiting for 3 weeks EXAM: PORTABLE CHEST 1 VIEW COMPARISON:  Chest radiograph 12/14/2021 CTA chest 11/23/2018 FINDINGS: The cardiac silhouette is enlarged, similar to the prior study. The upper mediastinal contours are stable. There is retrocardiac opacity with silhouetting of the diaphragm which may reflect infection, aspiration, or atelectasis. There is no other focal airspace disease. There is a probable small left pleural effusion. There is no pneumothorax There is no acute osseous abnormality. IMPRESSION: 1. Enlarged cardiac silhouette, not significantly changed. 2. Retrocardiac opacity with silhouetting of the left diaphragm which may reflect infection, aspiration, or atelectasis. Probable small left pleural effusion. Electronically Signed   By: Valetta Mole M.D.   On: 01/24/2022 08:51    Procedures Procedures    Medications Ordered in ED Medications  naloxone [facility]) injection 0.2 mg (0.2 mg Intravenous Not Given 01/24/22 0834)  metoCLOPramide (REGLAN) injection 10 mg (10 mg Intravenous Given 01/24/22 0748)  HYDROmorphone (DILAUDID) injection 1 mg (1 mg Intravenous Given 01/24/22 0749)  diphenhydrAMINE (BENADRYL) injection 12.5 mg (12.5 mg Intravenous Given 01/24/22 0747)  naloxone [facility]) injection 0.2 mg (0.2 mg Intravenous Given 01/24/22 0815)    ED Course/ Medical Decision Making/ A&P Clinical Course as of 01/24/22 1055  Tue Jan 24, 2022  0745 26 yo female/ w/ ESRD on peritoneal dialysis here with abdominal pain, ongoing for several days, persistent.  CT abd three days ago with no acute findings, small pocket of ascites.  Pt was treated empirically in ED for peritonitis although they were not able to obtain PD culture due to supply issue in  the hospital, according to nephrologist note at the time.  She returns with diffuse abdominal pain, guarding, nausea and vomiting.  Pending labs, IV pain and nausea medications, and will discuss again options for culturing with nephrology.  Her ascites pocket is quite small on CT scan on prior imaging - I would favor IR approach for diagnostic paracentesis, if needed, over blind approach in ED, given risk of perforation. [MT]  0815 After receiving pain medications, patient subsequently became  diaphoretic, hypoxic and somnolent, desaturation to 50% on room air with hypoventilation, hypopnea, pinpoint pupils.  She revived to voice and stimulation, was initially placed on NRB and then weaned back to 4L .  BP hypertensive.  Xray chest pending, I suspect this may be related to narcotic & benadryl administration (although she was given the same doses on prior ED visit).  Accucheck 245.  ECG showing sinus tachycardia QT 517.  I've ordered 0.2 mg narcan to assist with a small amount of reversal [MT]  0818 This appears to be less likely a new or large PE given her BP is elevated and her hypoxic improved when awakened. [MT]  0829 Pt responded quickly and favorably to narcan [MT]    Clinical Course User Index [MT] Trifan, Carola Rhine, MD                           Medical Decision Making Amount and/or Complexity of Data Reviewed Labs: ordered. Radiology: ordered.  Risk Prescription drug management.   26 year old female presents ED for evaluation.  Please see HPI for further details.  On examination, patient tachycardic to 106 and afebrile.  Patient lung sounds clear bilaterally, patient nonhypoxic on room air.  Patient's abdomen is slightly distended, she endorses that she also feels as if her abdomen is distended.  There is no appreciable fluid wave.  Patient has tenderness along her entire upper quadrant as well as her periumbilical region.  Patient worked up utilizing the following labs and imaging  studies interpreted by me personally: - Lipase unremarkable - CMP shows elevated creatinine to 9.63 which is consistent with the patient baseline. - CBC shows decreased white blood cell count of 3.4, decreased hemoglobin at 10.1 however on chart review these are consistent with the patient baseline - Urinalysis pending - Chest x-ray shows retrocardiac opacity with silhouetting of the left diaphragm which may reflect infection, aspiration, atelectasis.  I agree with radiologist interpretation.  Patient treated with 1 mg Dilaudid, 10 mg Reglan, 12 and half milligrams Benadryl for itching.  Update: After patient received 1 mg Dilaudid, 12.5 mg Benadryl, 10 mg Reglan nursing staff notified me that the patient was somnolent, nonresponsive and diaphoretic.  Came to patient bedside, patient would not arouse to sternal rub.  At this time, I notified my attending physician to meet me in the patient's room.  My attending physician was in the room within 1 minute.  Patient pupils are pinpoint, her oxygen saturation was critical at 44% on room air.  Patient was placed on nonrebreather with high flow oxygen at this time, her oxygen saturation improved 100%.  CBG was obtained which was 245. Patient provided with 0.2 mg Narcan, patient responded well.  At this time, I feel that this patient requires inpatient admission for pain control as well as further evaluation of suspected SBP.  Patient has been discussed with nephrology, Dr. Jonnie Finner has agreed to consult on the patient.  Patient discussed with hospitalist Dr. Lorin Mercy who is agreed to admit the patient for further management.  Patient amenable to plan.  Patient stable at time of admission.   Final Clinical Impression(s) / ED Diagnoses Final diagnoses:  Pain of upper abdomen  Nausea and vomiting, unspecified vomiting type    Rx / DC Orders ED Discharge Orders     None         Azucena Cecil, PA-C 01/24/22 1055    Wyvonnia Dusky,  MD 01/24/22 1313

## 2022-01-24 NOTE — Progress Notes (Signed)
Inpatient Diabetes Program Recommendations  AACE/ADA: New Consensus Statement on Inpatient Glycemic Control (2015)  Target Ranges:  Prepandial:   less than 140 mg/dL      Peak postprandial:   less than 180 mg/dL (1-2 hours)      Critically ill patients:  140 - 180 mg/dL   Lab Results  Component Value Date   GLUCAP 245 (H) 01/24/2022   HGBA1C 9.2 (H) 11/23/2021    Spoke with pt regarding insulin pump. Pt has an Omnipod insulin pump and uses Humalog insulin. Pt does not have her PDA device to be able to perform insulin boluses. Pt has informed her mother to bring this evening. Pt reports it is estimated to be at the hospital between 6-7pm so she can fully operate her insulin pump. Please note insulin pump is infusing currently basal rate of 0.5 units/hr. Pt could verbalize that her Carb ratio is 1:8, and she said that he corrects 50 points above target of 130. May can verify with pump once PDA device is here.  Told pt to also have extra insulin pump pods and charger for insulin pump.  Ordered Novolog Correction scale to correct glucose trends prior to the arrival of the insulin pump PDA device for pt to fully cover herself.   Thanks,  Tama Headings RN, MSN, BC-ADM Inpatient Diabetes Coordinator Team Pager [phone] (8a-5p)

## 2022-01-24 NOTE — Plan of Care (Signed)
  Problem: Education: Goal: Ability to describe self-care measures that may prevent or decrease complications (Diabetes Survival Skills Education) will improve Outcome: Progressing   Problem: Coping: Goal: Ability to adjust to condition or change in health will improve Outcome: Progressing   

## 2022-01-24 NOTE — Progress Notes (Signed)
Inpatient Diabetes Program Recommendations  AACE/ADA: New Consensus Statement on Inpatient Glycemic Control   Target Ranges:  Prepandial:   less than 140 mg/dL      Peak postprandial:   less than 180 mg/dL (1-2 hours)      Critically ill patients:  140 - 180 mg/dL    Latest Reference Range & Units 01/17/22 04:47  Glucose-Capillary 70 - 99 mg/dL 119 (H)    Latest Reference Range & Units 01/21/22 14:35 01/22/22 16:47 01/24/22 08:00  Glucose 70 - 99 mg/dL 114 (H) 117 (H) 176 (H)   Review of Glycemic Control  Diabetes history: DM1 (makes NO insulin; requires basal, correction, and carb coverage insulin) Outpatient Diabetes medications: Insulin Pump with Humalog Current orders for Inpatient glycemic control: None; in ED  Inpatient Diabetes Program Recommendations:    Insulin Pump: If patient has on insulin pump and will be allowed to use it while inpatient, please use Insulin Pump order set to order insulin pump and CBGs AC&HS and 2am.   NOTE: Noted consult for diabetes coordinator. Per chart, patient has DM1 and uses an insulin pump with Humalog for DM control. Patient is followed by Dr.  Suzette Battiest (Endocrinologist) for DM management. Patient was inpatient 11/22/21-11/24/21 and diabetes coordinator spoke with patient on 11/23/21. Per note on 11/23/21, the following were insulin pump settings at that time:  Basal 0.5 units per hour Total basal per 24 hours: 12 units  Carb ratio 1:8 grams  Sensitivity Factor 1:50 mg/dl   Target Glucose 130 mg/dl  NURSING: If insulin pump order set is ordered please print off the Patient insulin pump contract and flow sheet. The insulin pump contract should be signed by the patient and then placed in the chart. The patient insulin pump flow sheet will be completed by the patient at the bedside and the RN caring for the patient will use the patient's flow sheet to document in the [facility]. RN will need to complete the Nursing Insulin Pump Flowsheet at least once a  shift. Patient will need to keep extra insulin pump supplies at the bedside at all times.   Thanks, Barnie Alderman, RN, MSN, [place] Diabetes Coordinator Inpatient Diabetes Program [phone] (Team Pager from 8am to [place])

## 2022-01-24 NOTE — ED Triage Notes (Signed)
Pt arrived via [facility] from home   C/o ab. Nausea, diarrhea, and vomitting   C/o lower abdominal sharp pain in nature ongoing for about 3 weeks repeat hospital visits   Reoccurring symptoms episodes of vomiting during transport   Took promethazine for nausea, no relief around 4AM   Last Vitals  106 HR  98%  146 CBG  160/90

## 2022-01-24 NOTE — Consult Note (Signed)
Renal Service Consult Note [facility]  Kathy Lewis 01/24/2022 [name], MD Requesting Physician: Dr. Lorin Mercy  Reason for Consult: Abd pain , ESRD pt on PD HPI: The patient is a 26 y.o. year-old w/ hx of T1DM, ESRD on PD, anemia, bipolar disease who presented to ED w/ abdominal pain, N/V/D. Has been to ED 3-4 times in the past 5 days per the patient. Had CT at [facility] ED. Pain is epigastric in the midline. Pt is being admitted. We are asked to see for ESRD.   Pt started dialysis w/ PD in Oct 2022. Has not had any PD problems. Her nephrology MD is in [place], Dr. Olivia Mackie.  Abd pain started last week midweek. Was initially only bothering her during exchanges at night. She lowered her fill volume from 1.5 L to 1.0 L and this helped. However pain is now worsened and occurs even when not doing PD exchanges.   Per the patient she has been checking her PD fluid (w/ print paper behind the bag of effluent) and has not noticed any cloudiness.    ROS - denies CP, no joint pain, no HA, no blurry vision, no rash, no diarrhea, no nausea/ vomiting, no dysuria, no difficulty voiding   Past Medical History  Past Medical History:  Diagnosis Date   Anxiety    Bipolar 2 disorder (HCC)    CHF (congestive heart failure) (HCC)    CKD (chronic kidney disease), stage III (HCC)    Depression    DKA (diabetic ketoacidoses)    HSV infection    on valtrex   Hypokalemia    Leukocytosis    Migraine    Noncompliance with medication regimen    Preeclampsia    Severe anemia    Type 1 diabetes mellitus ([place])    Past Surgical History  Past Surgical History:  Procedure Laterality Date   CARDIAC CATHETERIZATION     DILATION AND EVACUATION N/A 10/22/2019   Procedure: ULTRASOUND GUIDED DILATATION AND EVACUATION;  Surgeon: [name], MD;  Location: [facility] LD ORS;  Service: Gynecology;  Laterality: N/A;   NO PAST SURGERIES     peritoneal dialysis catheter insertion     RENAL BIOPSY      Family History  Family History  Adopted: Yes  Family history unknown: Yes   Social History  reports that she has quit smoking. Her smoking use included cigars and cigarettes. She smoked an average of 1 pack per day. She has never used smokeless tobacco. She reports that she does not drink alcohol and does not use drugs. Allergies  Allergies  Allergen Reactions   Cantaloupe Extract Allergy Skin Test Itching    Mouth itching     Strawberry Extract Itching    Mouth itches   Citrullus Vulgaris Itching    Makes mouth itch , ALL melons    Nsaids Other (See Comments)    Avoid per nephrology   Home medications Prior to Admission medications   Medication Sig Start Date End Date Taking? Authorizing Provider  atorvastatin (LIPITOR) 40 MG tablet Take 80 mg by mouth daily. 08/03/21   [provider]  azelastine (ASTELIN) 0.1 % nasal spray Place 2 sprays into both nostrils 2 (two) times daily. Use in each nostril as directed Patient taking differently: Place 2 sprays into both nostrils 2 (two) times daily as needed for allergies. Use in each nostril as directed 12/14/21   Hunsucker, Bonna Gains, MD  butalbital-acetaminophen-caffeine (FIORICET) [phone] MG tablet Take 1 tablet by  mouth daily as needed for headache. 02/18/21 02/18/22  Dwyane Dee, MD  calcitRIOL (ROCALTROL) 0.25 MCG capsule Take 0.25 mcg by mouth daily. 08/03/21   [provider]  carvedilol (COREG) 12.5 MG tablet Take 6.125 mg by mouth 2 (two) times daily with a meal.    [provider]  ENTRESTO 49-51 MG Take 1 tablet by mouth 2 (two) times daily. 12/23/21   [provider]  FEROSUL 325 (65 Fe) MG tablet Take 325 mg by mouth 3 (three) times daily. 04/30/21   [provider]  fluticasone (FLONASE) 50 MCG/ACT nasal spray Place 1 spray into both nostrils in the morning and at bedtime. Patient not taking: Reported on 01/22/2022 12/14/21   Hunsucker, Bonna Gains, MD  gentamicin cream  (GARAMYCIN) 0.1 % Apply 1 application topically daily. Apply to exit site. Patient not taking: Reported on 01/22/2022 08/12/21   Rai, Vernelle Emerald, MD  hydrALAZINE (APRESOLINE) 100 MG tablet Take 100 mg by mouth 3 (three) times daily. 08/05/21   [provider]  insulin lispro (HUMALOG) 100 UNIT/ML injection 0-10 Units as directed. Sliding Scale 1 unit per 8 carbs, 1/2 unit per hour 01/08/22   [provider]  Insulin Syringe-Needle U-100 (INSULIN SYRINGE .3CC/31GX5/16") 31G X 5/16" 0.3 ML MISC Use as directed 10/27/19   [name], MD  isosorbide mononitrate (IMDUR) 30 MG 24 hr tablet Take 30 mg by mouth daily.    [provider]  magnesium oxide (MAG-OX) 400 MG tablet Take 800 mg by mouth daily. 08/03/21   [provider]  medroxyPROGESTERone (DEPO-PROVERA) 150 MG/ML injection Inject 1 mL (150 mg total) into the muscle every 3 (three) months. Patient not taking: Reported on 01/22/2022 03/29/21   Gavin Pound, CNM  metoCLOPramide (REGLAN) 10 MG tablet Take 1 tablet (10 mg total) by mouth every 8 (eight) hours as needed for nausea. 01/17/22   Lucrezia Starch, MD  prochlorperazine (COMPAZINE) 5 MG tablet Take 1 tablet (5 mg total) by mouth every 6 (six) hours as needed for nausea or vomiting. 01/21/22   Regan Lemming, MD  Vitamin D, Ergocalciferol, (DRISDOL) 1.25 MG (50000 UNIT) CAPS capsule Take 50,000 Units by mouth every Sunday.    [provider]  escitalopram (LEXAPRO) 10 MG tablet Take 20 mg by mouth daily.  05/10/20 10/06/20  [provider]  furosemide (LASIX) 40 MG tablet Take 1 tablet (40 mg total) by mouth daily. Patient not taking: No sig reported 10/28/19 02/17/21  [name], MD  lisinopril (ZESTRIL) 10 MG tablet Take 10 mg by mouth daily.  02/23/20 10/06/20  [provider]  spironolactone (ALDACTONE) 25 MG tablet Take 25 mg by mouth daily.  04/20/20 10/06/20  [provider]     Vitals:   01/24/22 0915 01/24/22 0930  01/24/22 0945 01/24/22 1000  BP: (!) 154/123 (!) 155/113 (!) 149/108 (!) 146/106  Pulse: 100 88 82 79  Resp: 13 12 11 12   Temp:      TempSrc:      SpO2: 100% 100% 100% 100%   Exam Gen alert, no distress No rash, cyanosis or gangrene Sclera anicteric, throat clear  No jvd or bruits Chest clear bilat to bases, no rales/ wheezing RRR no MRG Abd soft ntnd no mass or ascites +bs RLQ PD cath w/ clean exit site MS no joint effusions or deformity Ext no LE or UE edema, no wounds or ulcers Neuro is alert, Ox 3 , nf     Home meds include - atorvastatin,  fioricet, rocaltrol, carvedilol 6.125 bid, entresto bid, ferosul, fluticasone, gentamicin cream, hydralazine 100 tid, insulin lispro, isosorbide mononitrate 30, mag ox, medroxyprogesterone 150 mg IM q 68mos, metoclopramide 10 q 8 prn, prochlorperazine, ergocalciferol 50K weekly  CT abd/ pelvis 5/27 - IMPRESSION: 1. No acute findings. No evidence of bowel obstruction or inflammation. 2. Mild cardiomegaly, small pericardial effusion, small amount of ascites and diffuse subcutaneous soft tissue edema, new since the prior CT.  CXR 5/30 - unremarkable   OP PD: [facility], Dr Olivia Mackie  4 exchange per night, no day bag, fill vol 1500 cc, dwell time ?, dry wt ?   Assessment/ Plan: Abdominal pain - recent diarrhea last week, resolved. Relatively new PD patient, started Oct 2022. No hx peritonitis. Will send off fluid for culture and cell count later this evening. Her abdomen exam is benign - not sure this is peritonitis, may be something else.  ESRD - on PD. Cont PD nightly while here.  HTN - uncontrolled, takes coreg and hydralazine and entresto at home. Cont here. No vol excess on exam but BP's up, will use all 2.5% fluids w/ pd tonite.  Anemia esrd - Hb 10-11 range, no esa needs for now MBD ckd - CCa in range, will add on phos. Hold vdra for now until records are available.  CM - last echo EF 30%, on entresto and coreg.  Type 1 DM - per pmd       Kelly Splinter  MD 01/24/2022, 10:18 AM Recent Labs  Lab 01/22/22 1647 01/24/22 0800  HGB 10.7* 10.1*  ALBUMIN 2.5* 2.5*  CALCIUM 8.1* 8.1*  CREATININE 9.18* 9.63*  K 4.7 5.0

## 2022-01-24 NOTE — ED Notes (Signed)
Portable xray at bedside.

## 2022-01-24 NOTE — H&P (Signed)
History and Physical    Patient: Kathy Lewis DJS:[id number] DOB: 21-Jul-1996 DOA: 01/24/2022 DOS: the patient was seen and examined on 01/24/2022 PCP: Medicine, [facility]  Patient coming from: Home - lives with 26yo daughter; Donald Prose:  Mother, [phone]   Chief Complaint: n/v  HPI: Kathy Lewis is a 26 y.o. female with medical history significant of chronic systolic CHF associated with postpartum cardiomyopathy in 2022 (EF 30%), DM, and ESRD on PD presenting with n/v/d.  This is her third recent visit to the ER for the same.  She was here on 5/23 and improved with symptomatic treatment.  She returned on 5/27 with no significant concerns for peritonitis and so was discharged again.  She returned on 5/28 with similar symptoms and nephrology was called; since she was stable and dialysate samples were not able to be obtained, she was treated with Rocephin and Vanc and advised to f/u at her HD center.     She reports abdominal pain, n/v/d, light-headedness.  Symptoms started last Sunday 5/21.  At first, she thought it was related to cheese eggs - she tossed them out.  Symptoms are still ongoing.  Pain is midepigastric in nature.  Diarrhea lasted 2 days and resolved. N/V is ongoing, vomited a number of times today still but nothing is coming up.  No fever.  Initially the pain would occur during PD but now the pain is more constant.  The pain is worse with gagging and with PD.  Denies similar symptoms prior.  No known abdominal disease.    ER Course:  About 1 week ago, abdominal pain with PD.  Became chronic whether dialyzing or not.  4th ER visit.  Concern for SBP, talked to nephrology but can no longer check fluid culture off port upstairs.  Given Rocephin, Vanc.  Last PD full session was 5/27.  Mild abdominal distention.  Needs fluid culture - nephrology will consult.  Given Dilaudid, Benadryl, Reglan - with apnea, O2 50%, improved with Narcan.     Review of Systems: As mentioned  in the history of present illness. All other systems reviewed and are negative. Past Medical History:  Diagnosis Date   Anxiety    Bipolar 2 disorder (HCC)    Chronic systolic (congestive) heart failure (HCC)    Depression    DKA (diabetic ketoacidoses)    ESRD on peritoneal dialysis ([place])    HSV infection    on valtrex   Hypokalemia    Leukocytosis    Migraine    Noncompliance with medication regimen    Preeclampsia    Severe anemia    Type 1 diabetes mellitus ([place])    Past Surgical History:  Procedure Laterality Date   CARDIAC CATHETERIZATION     DILATION AND EVACUATION N/A 10/22/2019   Procedure: ULTRASOUND GUIDED DILATATION AND EVACUATION;  Surgeon: [name], MD;  Location: [facility] LD [facility]S;  Service: Gynecology;  Laterality: N/A;   NO PAST SURGERIES     peritoneal dialysis catheter insertion     RENAL BIOPSY     Social History:  reports that she has quit smoking. Her smoking use included cigars and cigarettes. She has a 2.00 pack-year smoking history. She has never used smokeless tobacco. She reports that she does not drink alcohol and does not use drugs.  Allergies  Allergen Reactions   Cantaloupe Extract Allergy Skin Test Itching    Mouth itching     Strawberry Extract Itching    Mouth itches   Citrullus Vulgaris Itching  Makes mouth itch , ALL melons    Nsaids Other (See Comments)    Avoid per nephrology    Family History  Adopted: Yes  Family history unknown: Yes    Prior to Admission medications   Medication Sig Start Date End Date Taking? Authorizing Provider  atorvastatin (LIPITOR) 40 MG tablet Take 80 mg by mouth daily. 08/03/21   [provider]  azelastine (ASTELIN) 0.1 % nasal spray Place 2 sprays into both nostrils 2 (two) times daily. Use in each nostril as directed Patient taking differently: Place 2 sprays into both nostrils 2 (two) times daily as needed for allergies. Use in each nostril as directed 12/14/21   Hunsucker, Bonna Gains,  MD  butalbital-acetaminophen-caffeine [facility]) [phone] MG tablet Take 1 tablet by mouth daily as needed for headache. 02/18/21 02/18/22  Dwyane Dee, MD  calcitRIOL (ROCALTROL) 0.25 MCG capsule Take 0.25 mcg by mouth daily. 08/03/21   [provider]  carvedilol (COREG) 12.5 MG tablet Take 6.125 mg by mouth 2 (two) times daily with a meal.    [provider]  ENTRESTO 49-51 MG Take 1 tablet by mouth 2 (two) times daily. 12/23/21   [provider]  FEROSUL 325 (65 Fe) MG tablet Take 325 mg by mouth 3 (three) times daily. 04/30/21   [provider]  fluticasone (FLONASE) 50 MCG/ACT nasal spray Place 1 spray into both nostrils in the morning and at bedtime. Patient not taking: Reported on 01/22/2022 12/14/21   Hunsucker, Bonna Gains, MD  gentamicin cream (GARAMYCIN) 0.1 % Apply 1 application topically daily. Apply to exit site. Patient not taking: Reported on 01/22/2022 08/12/21   Rai, Vernelle Emerald, MD  hydrALAZINE (APRESOLINE) 100 MG tablet Take 100 mg by mouth 3 (three) times daily. 08/05/21   [provider]  insulin lispro (HUMALOG) 100 UNIT/ML injection 0-10 Units as directed. Sliding Scale 1 unit per 8 carbs, 1/2 unit per hour 01/08/22   [provider]  Insulin Syringe-Needle U-100 (INSULIN SYRINGE .3CC/31GX5/16") 31G X 5/16" 0.3 ML MISC Use as directed 10/27/19   [name], MD  isosorbide mononitrate (IMDUR) 30 MG 24 hr tablet Take 30 mg by mouth daily.    [provider]  magnesium oxide (MAG-OX) 400 MG tablet Take 800 mg by mouth daily. 08/03/21   [provider]  medroxyPROGESTERone (DEPO-PROVERA) 150 MG/ML injection Inject 1 mL (150 mg total) into the muscle every 3 (three) months. Patient not taking: Reported on 01/22/2022 03/29/21   Gavin Pound, CNM  metoCLOPramide (REGLAN) 10 MG tablet Take 1 tablet (10 mg total) by mouth every 8 (eight) hours as needed for nausea. 01/17/22   Lucrezia Starch, MD  prochlorperazine  (COMPAZINE) 5 MG tablet Take 1 tablet (5 mg total) by mouth every 6 (six) hours as needed for nausea or vomiting. 01/21/22   Regan Lemming, MD  Vitamin D, Ergocalciferol, (DRISDOL) 1.25 MG (50000 UNIT) CAPS capsule Take 50,000 Units by mouth every Sunday.    [provider]  escitalopram (LEXAPRO) 10 MG tablet Take 20 mg by mouth daily.  05/10/20 10/06/20  [provider]  furosemide (LASIX) 40 MG tablet Take 1 tablet (40 mg total) by mouth daily. Patient not taking: No sig reported 10/28/19 02/17/21  [name], MD  lisinopril (ZESTRIL) 10 MG tablet Take 10 mg by mouth daily.  02/23/20 10/06/20  [provider]  spironolactone (ALDACTONE) 25 MG tablet Take 25 mg by mouth daily.  04/20/20 10/06/20  [provider]  Physical Exam: Vitals:   01/24/22 1145 01/24/22 1200 01/24/22 1215 01/24/22 1230  BP: (!) 133/114 (!) 140/109 (!) 155/111 (!) 145/115  Pulse: 82 84 85 84  Resp: 19 19 (!) 22 (!) 22  Temp:      TempSrc:      SpO2: 96% 99% 97% 99%   General:  Appears calm and comfortable and is in NAD Eyes:  PERRL, EOMI, normal lids, iris ENT:  grossly normal hearing, lips & tongue, mmm; appropriate dentition Neck:  no LAD, masses or thyromegaly Cardiovascular:  RRR, no m/r/g. No LE edema.  Respiratory:   CTA bilaterally with no wheezes/rales/rhonchi.  Normal respiratory effort. Abdomen:  soft, NT, ND, PR catheter in R mid-abdomen without apparent erythema or drainage Skin:  no rash or induration seen on limited exam Musculoskeletal:  grossly normal tone BUE/BLE, good ROM, no bony abnormality Psychiatric:  grossly normal mood and affect, speech fluent and appropriate, AOx3 Neurologic:  CN 2-12 grossly intact, moves all extremities in coordinated fashion   Radiological Exams on Admission: Independently reviewed - see discussion in A/P where applicable  DG Chest Portable 1 View  Result Date: 01/24/2022 CLINICAL DATA:  Nausea, diarrhea, vomiting for 3 weeks  EXAM: PORTABLE CHEST 1 VIEW COMPARISON:  Chest radiograph 12/14/2021 CTA chest 11/23/2018 FINDINGS: The cardiac silhouette is enlarged, similar to the prior study. The upper mediastinal contours are stable. There is retrocardiac opacity with silhouetting of the diaphragm which may reflect infection, aspiration, or atelectasis. There is no other focal airspace disease. There is a probable small left pleural effusion. There is no pneumothorax There is no acute osseous abnormality. IMPRESSION: 1. Enlarged cardiac silhouette, not significantly changed. 2. Retrocardiac opacity with silhouetting of the left diaphragm which may reflect infection, aspiration, or atelectasis. Probable small left pleural effusion. Electronically Signed   By: Valetta Mole M.D.   On: 01/24/2022 08:51    EKG: Independently reviewed.  Sinus tachycardia with rate 110; prolonged QTc 517; low voltage with nonspecific ST changes    Labs on Admission: I have personally reviewed the available labs and imaging studies at the time of the admission.  Pertinent labs:    Glucose 176 BUN 62/Creatinine 9.63/GFR 5 Albumin 2.5 WBC 3.4 Hgb 10.1   Assessment and Plan: Principal Problem:   Abdominal pain of unknown cause Active Problems:   ESRD (end stage renal disease) (HCC)   Nausea and vomiting   Chronic systolic CHF (congestive heart failure) (HCC)   Prolonged QT interval   HTN (hypertension)   Uncontrolled type 1 diabetes mellitus with hyperglycemia, with long-term current use of insulin (HCC)    Abdominal pain with n/v -Patient with 3 recent ER visits for abdominal pain with n/v/d -Diarrhea has resolved but other symptoms are still present -Given the presence of PD catheter, there is concern for peritonitis/SBP -Exam is currently benign, which is reassuring -Will admit to telemetry -Dialysate sample is requested, will hold further abx for now -Gastritis/esophagitis also seems likely given description; will add Protonix and  Carafate -If ongoing issues, will need GI consult -Continue Reglan, Compazine  ESRD on PD -Patient on chronic PD -Nephrology prn order set utilized -Nephrology is aware that patient will need PD/HD (it appears that we can now do PD) -Continue Calcitriol  Type 1 DM -Last A1c was 9.2 on 3/29 - indicating poor diabetes control -hold Glucophage -Cover with moderate-scale SSI   Chronic systolic CHF -Most recent echo was in 10/2021 with EF 20% -Continue BB, Entresto, Imdur  HTN -Continue  Coreg, Entresto, hydralazine  HLD -Continue Lipitor  Prolonged QTc -Will attempt to minimize QT-prolonging medications such as PPI, nausea meds, SSRIs -Repeat EKG in AM     Advance Care Planning:   Code Status: Full Code   Consults: Nephrology; diabetes coordinator; [facility] team  DVT Prophylaxis: Heparin  Family Communication: None present; her mother is coming up from [place], [place] later today  Severity of Illness: The appropriate patient status for this patient is INPATIENT. Inpatient status is judged to be reasonable and necessary in order to provide the required intensity of service to ensure the patient's safety. The patient's presenting symptoms, physical exam findings, and initial radiographic and laboratory data in the context of their chronic comorbidities is felt to place them at high risk for further clinical deterioration. Furthermore, it is not anticipated that the patient will be medically stable for discharge from the hospital within 2 midnights of admission.   * I certify that at the point of admission it is my clinical judgment that the patient will require inpatient hospital care spanning beyond 2 midnights from the point of admission due to high intensity of service, high risk for further deterioration and high frequency of surveillance required.*  Author: Karmen Bongo, MD 01/24/2022 1:24 PM  For on call review [email or web address].

## 2022-01-24 NOTE — ED Notes (Signed)
RN came from administering care in another pts room, pt was pale, diaphoretic and had decreased LOC with limited responsiveness and decreased respirations. Pt pulled into an exam room from hallway, Gerald Stabs, [place] and Langston Masker, MD at bedside. VS assessed, SpO2 50s. Pt placed on 15L via NRB. Other VS WNL. SpO2 quickly rose to 100%, pt placed on 4L via [place] at this time. Pt remains diaphoretic. CBG checked and EKG obtained. Pupils pinpoint. Verbal orders for 0.2mg  IV narcan placed by Trifan MD. Medication administered per order. Pt alert at this time.

## 2022-01-25 LAB — BODY FLUID CELL COUNT WITH DIFFERENTIAL
Lymphs, Fluid: 11 %
Monocyte-Macrophage-Serous Fluid: 87 % (ref 50–90)
Neutrophil Count, Fluid: 2 % (ref 0–25)
Total Nucleated Cell Count, Fluid: 13 cu mm (ref 0–1000)

## 2022-01-25 LAB — GASTROINTESTINAL PANEL BY PCR, STOOL (REPLACES STOOL CULTURE)

## 2022-01-25 LAB — GLUCOSE, CAPILLARY
Glucose-Capillary: 189 mg/dL — ABNORMAL HIGH (ref 70–99)
Glucose-Capillary: 221 mg/dL — ABNORMAL HIGH (ref 70–99)
Glucose-Capillary: 249 mg/dL — ABNORMAL HIGH (ref 70–99)
Glucose-Capillary: 281 mg/dL — ABNORMAL HIGH (ref 70–99)

## 2022-01-25 LAB — PHOSPHORUS: Phosphorus: 6.4 mg/dL — ABNORMAL HIGH (ref 2.5–4.6)

## 2022-01-25 LAB — PATHOLOGIST SMEAR REVIEW

## 2022-01-25 MED ORDER — ONDANSETRON HCL 4 MG PO TABS: 4.0000 mg | ORAL_TABLET | Freq: Two times a day (BID) | ORAL | 0 refills | Status: DC | PRN

## 2022-01-25 NOTE — Plan of Care (Signed)
  Problem: Education: Goal: Ability to describe self-care measures that may prevent or decrease complications (Diabetes Survival Skills Education) will improve Outcome: Completed/Met Goal: Individualized Educational Video(s) Outcome: Completed/Met   Problem: Coping: Goal: Ability to adjust to condition or change in health will improve Outcome: Completed/Met   Problem: Health Behavior/Discharge Planning: Goal: Ability to identify and utilize available resources and services will improve Outcome: Completed/Met   Problem: Metabolic: Goal: Ability to maintain appropriate glucose levels will improve Outcome: Completed/Met   Problem: Nutritional: Goal: Maintenance of adequate nutrition will improve Outcome: Completed/Met Goal: Progress toward achieving an optimal weight will improve Outcome: Completed/Met   Problem: Health Behavior/Discharge Planning: Goal: Ability to manage health-related needs will improve Outcome: Completed/Met   Problem: Nutritional: Goal: Ability to make healthy dietary choices will improve Outcome: Completed/Met   Problem: Clinical Measurements: Goal: Complications related to the disease process, condition or treatment will be avoided or minimized Outcome: Completed/Met   Problem: Education: Goal: Knowledge of General Education information will improve Description: Including pain rating scale, medication(s)/side effects and non-pharmacologic comfort measures Outcome: Completed/Met   Problem: Health Behavior/Discharge Planning: Goal: Ability to manage health-related needs will improve Outcome: Completed/Met   Problem: Clinical Measurements: Goal: Ability to maintain clinical measurements within normal limits will improve Outcome: Completed/Met Goal: Will remain free from infection Outcome: Completed/Met Goal: Diagnostic test results will improve Outcome: Completed/Met Goal: Respiratory complications will improve Outcome: Completed/Met Goal:  Cardiovascular complication will be avoided Outcome: Completed/Met   Problem: Activity: Goal: Risk for activity intolerance will decrease Outcome: Completed/Met   Problem: Nutrition: Goal: Adequate nutrition will be maintained Outcome: Completed/Met   Problem: Coping: Goal: Level of anxiety will decrease Outcome: Completed/Met   Problem: Elimination: Goal: Will not experience complications related to bowel motility Outcome: Completed/Met Goal: Will not experience complications related to urinary retention Outcome: Completed/Met   Problem: Pain Managment: Goal: General experience of comfort will improve Outcome: Completed/Met   Problem: Safety: Goal: Ability to remain free from injury will improve Outcome: Completed/Met   Problem: Skin Integrity: Goal: Risk for impaired skin integrity will decrease Outcome: Completed/Met

## 2022-01-25 NOTE — Progress Notes (Signed)
DISCHARGE NOTE HOME Reaghan Kawa to be discharged Home per MD order. Discussed prescriptions and follow up appointments with the patient. Prescriptions given to patient; medication list explained in detail. Patient verbalized understanding.  Skin clean, dry and intact without evidence of skin break down, no evidence of skin tears noted. IV catheter discontinued intact. Site without signs and symptoms of complications. Dressing and pressure applied. Pt denies pain at the site currently. No complaints noted.  Patient free of lines, drains, and wounds.   An After Visit Summary (AVS) was printed and given to the patient. Patient escorted via wheelchair, and discharged home via private auto.  Vira Agar, RN

## 2022-01-25 NOTE — Discharge Instructions (Signed)
Start Renal (Kidney) Specific Multivitamin at discharge. Patient to follow-up with outpatient RD at dialysis center.  Examples of Renal MVI include Rena-Vite, Nephrovite, Dialyvite

## 2022-01-25 NOTE — Discharge Summary (Signed)
Physician Discharge Summary  Kathy Lewis XTK:[id number] DOB: 1995-10-16 DOA: 01/24/2022  PCP: Medicine, [place] date: 01/24/2022 Discharge date: 01/25/2022  Admitted From: Home Disposition:  Home  Recommendations for Outpatient Follow-up:  Follow up with PCP in 1-2 weeks Follow up with GI if symptoms continue  Discharge Condition:Stable  CODE STATUS:Full  Diet recommendation: Bland/Full liquid diet   Brief/Interim Summary: Kathy Lewis is a 26 y.o. female with medical history significant of chronic systolic CHF associated with postpartum cardiomyopathy in 2022 (EF 30%), DM, and ESRD on PD presenting with n/v/d.  This is her third recent visit to the ER for the same.  Abdominal pain with intractable n/v resolved Peritonitis/SBP ruled out Cannot rule out gastroparesis given DM -Patient with 3 recent ER visits for abdominal pain with n/v/d - unclear if diet related but able to tolerate PO now (bland/full liquid diet) - continue this diet for at least the next 5 to 7 days at minimum. -Follow up with GI if symptoms are ongoing -Diarrhea has resolved but other symptoms are still present -Continue zofran at discharge   ESRD on PD -Patient on chronic PD -Nephrology following   Type 1 DM, uncontrolled with hyperglycemia -Last A1c was 9.2 on 3/29 - indicating poor diabetes control -?Gastroparesis - follow up with PCP/endocrinology/GI as discussed   Chronic systolic CHF -Most recent echo was in 10/2021 with EF 20% -Continue BB, Entresto, Imdur -No medication changes at discharge   HTN -Continue Coreg, Entresto, hydralazine   HLD -Continue Lipitor   Prolonged QTc -Will attempt to minimize QT-prolonging medications such as PPI, nausea meds, SSRIs -Repeat EKG in AM    Discharge Diagnoses:  Principal Problem:   Abdominal pain of unknown cause Active Problems:   ESRD (end stage renal disease) (HCC)   Nausea and vomiting   Chronic systolic CHF  (congestive heart failure) (HCC)   Prolonged QT interval   HTN (hypertension)   Uncontrolled type 1 diabetes mellitus with hyperglycemia, with long-term current use of insulin [facility])    Discharge Instructions  Discharge Instructions     Discharge patient   Complete by: As directed    Discharge disposition: 01-Home or Self Care   Discharge patient date: 01/25/2022      Allergies as of 01/25/2022       Reactions   Cantaloupe Extract Allergy Skin Test Itching   Mouth itching   Strawberry Extract Itching   Mouth itches   Citrullus Vulgaris Itching   Makes mouth itch , ALL melons   Nsaids Other (See Comments)   Avoid per nephrology        Medication List     TAKE these medications    atorvastatin 40 MG tablet Commonly known as: LIPITOR Take 80 mg by mouth daily.   azelastine 0.1 % nasal spray Commonly known as: ASTELIN Place 2 sprays into both nostrils 2 (two) times daily. Use in each nostril as directed What changed:  when to take this reasons to take this   butalbital-acetaminophen-caffeine 50-325-40 MG tablet Commonly known as: FIORICET Take 1 tablet by mouth daily as needed for headache.   calcitRIOL 0.25 MCG capsule Commonly known as: ROCALTROL Take 0.25 mcg by mouth daily.   carvedilol 12.5 MG tablet Commonly known as: COREG Take 6.125 mg by mouth 2 (two) times daily with a meal.   Entresto 49-51 MG Generic drug: sacubitril-valsartan Take 1 tablet by mouth 2 (two) times daily.   FeroSul 325 (65 FE) MG tablet Generic drug: ferrous  sulfate Take 325 mg by mouth 3 (three) times daily.   fluticasone 50 MCG/ACT nasal spray Commonly known as: FLONASE Place 1 spray into both nostrils in the morning and at bedtime.   hydrALAZINE 100 MG tablet Commonly known as: APRESOLINE Take 100 mg by mouth 3 (three) times daily.   insulin lispro 100 UNIT/ML injection Commonly known as: HUMALOG 0-10 Units as directed. Sliding Scale 1 unit per 8 carbs, 1/2 unit per  hour   INSULIN SYRINGE .3CC/31GX5/16" 31G X 5/16" 0.3 ML Misc Use as directed   isosorbide mononitrate 30 MG 24 hr tablet Commonly known as: IMDUR Take 30 mg by mouth daily.   magnesium oxide 400 MG tablet Commonly known as: MAG-OX Take 800 mg by mouth daily.   metoCLOPramide 10 MG tablet Commonly known as: REGLAN Take 1 tablet (10 mg total) by mouth every 8 (eight) hours as needed for nausea.   ondansetron 4 MG tablet Commonly known as: Zofran Take 1 tablet (4 mg total) by mouth every 12 (twelve) hours as needed for nausea or vomiting.   prochlorperazine 5 MG tablet Commonly known as: COMPAZINE Take 1 tablet (5 mg total) by mouth every 6 (six) hours as needed for nausea or vomiting.   Vitamin D (Ergocalciferol) 1.25 MG (50000 UNIT) Caps capsule Commonly known as: DRISDOL Take 50,000 Units by mouth every Sunday.        Allergies  Allergen Reactions   Cantaloupe Extract Allergy Skin Test Itching    Mouth itching     Strawberry Extract Itching    Mouth itches   Citrullus Vulgaris Itching    Makes mouth itch , ALL melons    Nsaids Other (See Comments)    Avoid per nephrology    Consultations: Nephrology   Procedures/Studies: CT ABDOMEN PELVIS WO CONTRAST  Result Date: 01/21/2022 CLINICAL DATA:  Abdominal pain.  Vomiting for 7 days. EXAM: CT ABDOMEN AND PELVIS WITHOUT CONTRAST TECHNIQUE: Multidetector CT imaging of the abdomen and pelvis was performed following the standard protocol without IV contrast. RADIATION DOSE REDUCTION: This exam was performed according to the departmental dose-optimization program which includes automated exposure control, adjustment of the mA and/or kV according to patient size and/or use of iterative reconstruction technique. COMPARISON:  10/14/2017. FINDINGS: Lower chest: Heart mildly enlarged. Small pericardial effusion. Interstitial thickening at the lung bases. Mild dependent atelectasis at the left lung base. These findings are new  from the prior CT. Hepatobiliary: No focal liver abnormality is seen. No gallstones, gallbladder wall thickening, or biliary dilatation. Pancreas: Pancreatic atrophy.  No mass or inflammation. Spleen: Normal in size without focal abnormality. Adrenals/Urinary Tract: No adrenal masses. Kidneys normal in size, orientation and position. No renal mass, stone or hydronephrosis. Normal ureters. Unremarkable bladder. Stomach/Bowel: Normal stomach. Small bowel and colon are normal in caliber. No wall thickening. No convincing inflammation. Vascular/Lymphatic: No significant vascular findings are present. No enlarged abdominal or pelvic lymph nodes. Reproductive: Uterus and bilateral adnexa are unremarkable. Other: Small amount of ascites, new since the prior CT. Peritoneal dialysis catheter curls in the lower pelvis. Diffuse subcutaneous edema. Musculoskeletal: No acute or significant osseous findings. IMPRESSION: 1. No acute findings. No evidence of bowel obstruction or inflammation. 2. Mild cardiomegaly, small pericardial effusion, small amount of ascites and diffuse subcutaneous soft tissue edema, new since the prior CT. Electronically Signed   By: Lajean Manes M.D.   On: 01/21/2022 16:04   DG Chest Portable 1 View  Result Date: 01/24/2022 CLINICAL DATA:  Nausea, diarrhea, vomiting for  3 weeks EXAM: PORTABLE CHEST 1 VIEW COMPARISON:  Chest radiograph 12/14/2021 CTA chest 11/23/2018 FINDINGS: The cardiac silhouette is enlarged, similar to the prior study. The upper mediastinal contours are stable. There is retrocardiac opacity with silhouetting of the diaphragm which may reflect infection, aspiration, or atelectasis. There is no other focal airspace disease. There is a probable small left pleural effusion. There is no pneumothorax There is no acute osseous abnormality. IMPRESSION: 1. Enlarged cardiac silhouette, not significantly changed. 2. Retrocardiac opacity with silhouetting of the left diaphragm which may  reflect infection, aspiration, or atelectasis. Probable small left pleural effusion. Electronically Signed   By: Valetta Mole M.D.   On: 01/24/2022 08:51     Subjective: No acute issues/events overnight   Discharge Exam: Vitals:   01/25/22 0502 01/25/22 0912  BP: (!) 124/97 119/84  Pulse: 93 89  Resp: 18 16  Temp: 98.3 F (36.8 C) 98.2 F (36.8 C)  SpO2: 98% 97%   Vitals:   01/24/22 1918 01/24/22 2046 01/25/22 0502 01/25/22 0912  BP:  123/85 (!) 124/97 119/84  Pulse:  89 93 89  Resp:  18 18 16   Temp:  98.9 F (37.2 C) 98.3 F (36.8 C) 98.2 F (36.8 C)  TempSrc:   Oral Oral  SpO2:  99% 98% 97%  Weight: 70.8 kg  64.1 kg   Height:        General: Pt is alert, awake, not in acute distress Cardiovascular: RRR, S1/S2 +, no rubs, no gallops Respiratory: CTA bilaterally, no wheezing, no rhonchi Abdominal: Soft, NT, ND, bowel sounds + Extremities: no edema, no cyanosis    The results of significant diagnostics from this hospitalization (including imaging, microbiology, ancillary and laboratory) are listed below for reference.     Microbiology: Recent Results (from the past 240 hour(s))  SARS Coronavirus 2 by RT PCR (hospital order, performed in [facility] hospital lab) *cepheid single result test* Anterior Nasal Swab     Status: None   Collection Time: 01/21/22  3:39 PM   Specimen: Anterior Nasal Swab  Result Value Ref Range Status   SARS Coronavirus 2 by RT PCR NEGATIVE NEGATIVE Final    Comment: (NOTE) SARS-CoV-2 target nucleic acids are NOT DETECTED.  The SARS-CoV-2 RNA is generally detectable in upper and lower respiratory specimens during the acute phase of infection. The lowest concentration of SARS-CoV-2 viral copies this assay can detect is 250 copies / mL. A negative result does not preclude SARS-CoV-2 infection and should not be used as the sole basis for treatment or other patient management decisions.  A negative result may occur with improper specimen  collection / handling, submission of specimen other than nasopharyngeal swab, presence of viral mutation(s) within the areas targeted by this assay, and inadequate number of viral copies (<250 copies / mL). A negative result must be combined with clinical observations, patient history, and epidemiological information.  Fact Sheet for Patients:   https://www.patel.info/  Fact Sheet for Healthcare Providers: https://hall.com/  This test is not yet approved or  cleared by the [place] FDA and has been authorized for detection and/or diagnosis of SARS-CoV-2 by FDA under an Emergency Use Authorization (EUA).  This EUA will remain in effect (meaning this test can be used) for the duration of the COVID-19 declaration under Section 564(b)(1) of the Act, 21 U.S.C. section 360bbb-3(b)(1), unless the authorization is terminated or revoked sooner.  Performed at [facility], [address] [place], [place] [address]   Culture, body  fluid w Gram Stain-bottle     Status: None (Preliminary result)   Collection Time: 01/24/22 11:08 AM   Specimen: Peritoneal Washings  Result Value Ref Range Status   Specimen Description PERITONEAL  Final   Special Requests NONE  Final   Culture   Final    NO GROWTH < 24 HOURS Performed at [facility], [place] [address] [place], [place] [address]    Report Status PENDING  Incomplete  Gram stain     Status: None   Collection Time: 01/24/22 11:08 AM   Specimen: Peritoneal Washings  Result Value Ref Range Status   Specimen Description PERITONEAL  Final   Special Requests NONE  Final   Gram Stain   Final    NO WBC SEEN NO ORGANISMS SEEN Performed at [facility], [address] [place], [place] [address]    Report Status 01/24/2022 FINAL  Final  Gastrointestinal Panel by PCR , Stool     Status: None   Collection Time: 01/24/22  8:01 PM   Specimen: STOOL  Result Value Ref Range Status    Campylobacter species NOT DETECTED NOT DETECTED Final   Plesimonas shigelloides NOT DETECTED NOT DETECTED Final   Salmonella species NOT DETECTED NOT DETECTED Final   Yersinia enterocolitica NOT DETECTED NOT DETECTED Final   Vibrio species NOT DETECTED NOT DETECTED Final   Vibrio cholerae NOT DETECTED NOT DETECTED Final   Enteroaggregative E coli (EAEC) NOT DETECTED NOT DETECTED Final   Enteropathogenic E coli (EPEC) NOT DETECTED NOT DETECTED Final   Enterotoxigenic E coli (ETEC) NOT DETECTED NOT DETECTED Final   Shiga like toxin producing E coli (STEC) NOT DETECTED NOT DETECTED Final   Shigella/Enteroinvasive E coli (EIEC) NOT DETECTED NOT DETECTED Final   Cryptosporidium NOT DETECTED NOT DETECTED Final   Cyclospora cayetanensis NOT DETECTED NOT DETECTED Final   Entamoeba histolytica NOT DETECTED NOT DETECTED Final   Giardia lamblia NOT DETECTED NOT DETECTED Final   Adenovirus F40/41 NOT DETECTED NOT DETECTED Final   Astrovirus NOT DETECTED NOT DETECTED Final   Norovirus GI/GII NOT DETECTED NOT DETECTED Final   Rotavirus A NOT DETECTED NOT DETECTED Final   Sapovirus (I, II, IV, and V) NOT DETECTED NOT DETECTED Final    Comment: Performed at [facility], [address] [place], [place] [address]  C Difficile Quick Screen w PCR reflex     Status: None   Collection Time: 01/24/22  8:01 PM   Specimen: STOOL  Result Value Ref Range Status   C Diff antigen NEGATIVE NEGATIVE Final   C Diff toxin NEGATIVE NEGATIVE Final   C Diff interpretation No C. difficile detected.  Final    Comment: Performed at [facility], [place] [address], [place], [place] [address]     Labs: BNP (last 3 results) Recent Labs    08/11/21 1143 11/22/21 1027  BNP 1,278.5* 7,824.2*   Basic Metabolic Panel: Recent Labs  Lab 01/21/22 1435 01/22/22 1647 01/24/22 0800  NA 137 135 137  K 4.7 4.7 5.0  CL 103 103 106  CO2 24 23 20*  GLUCOSE 114* 117* 176*  BUN 57* 61* 62*  CREATININE 8.43*  9.18* 9.63*  CALCIUM 8.2* 8.1* 8.1*  PHOS  --   --  6.4*   Liver Function Tests: Recent Labs  Lab 01/21/22 1435 01/22/22 1647 01/24/22 0800  AST 15 15 16   ALT 18 16 16   ALKPHOS 66 61 58  BILITOT 0.7 0.5 0.6  PROT 6.2* 5.3* 5.3*  ALBUMIN 2.9* 2.5* 2.5*   Recent Labs  Lab 01/21/22 1435 01/22/22 1647 01/24/22 0800  LIPASE 18 22 22    No results for input(s): AMMONIA in the last 168 hours. CBC: Recent Labs  Lab 01/21/22 1435 01/22/22 1647 01/24/22 0800  WBC 3.5* 3.4* 3.4*  NEUTROABS 2.2  --  2.7  HGB 11.6* 10.7* 10.1*  HCT 35.6* 34.1* 30.9*  MCV 97.8 100.0 97.2  PLT 251 231 208   Cardiac Enzymes: No results for input(s): CKTOTAL, CKMB, CKMBINDEX, TROPONINI in the last 168 hours. BNP: Invalid input(s): POCBNP CBG: Recent Labs  Lab 01/24/22 2046 01/25/22 0022 01/25/22 0354 01/25/22 0736 01/25/22 1225  GLUCAP 300* 249* 221* 189* 281*   D-Dimer No results for input(s): DDIMER in the last 72 hours. Hgb A1c No results for input(s): HGBA1C in the last 72 hours. Lipid Profile No results for input(s): CHOL, HDL, LDLCALC, TRIG, CHOLHDL, LDLDIRECT in the last 72 hours. Thyroid function studies No results for input(s): TSH, T4TOTAL, T3FREE, THYROIDAB in the last 72 hours.  Invalid input(s): FREET3 Anemia work up No results for input(s): VITAMINB12, FOLATE, FERRITIN, TIBC, IRON, RETICCTPCT in the last 72 hours. Urinalysis    Component Value Date/Time   COLORURINE YELLOW 08/11/2021 1421   APPEARANCEUR CLEAR 08/11/2021 1421   LABSPEC 1.020 08/11/2021 1421   PHURINE 6.0 08/11/2021 1421   GLUCOSEU >=500 (A) 08/11/2021 1421   HGBUR SMALL (A) 08/11/2021 1421   BILIRUBINUR NEGATIVE 08/11/2021 1421   KETONESUR NEGATIVE 08/11/2021 1421   PROTEINUR >300 (A) 08/11/2021 1421   UROBILINOGEN 0.2 12/15/2015 1736   NITRITE NEGATIVE 08/11/2021 1421   LEUKOCYTESUR NEGATIVE 08/11/2021 1421   Sepsis Labs Invalid input(s): PROCALCITONIN,  WBC,   LACTICIDVEN Microbiology Recent Results (from the past 240 hour(s))  SARS Coronavirus 2 by RT PCR (hospital order, performed in [facility] hospital lab) *cepheid single result test* Anterior Nasal Swab     Status: None   Collection Time: 01/21/22  3:39 PM   Specimen: Anterior Nasal Swab  Result Value Ref Range Status   SARS Coronavirus 2 by RT PCR NEGATIVE NEGATIVE Final    Comment: (NOTE) SARS-CoV-2 target nucleic acids are NOT DETECTED.  The SARS-CoV-2 RNA is generally detectable in upper and lower respiratory specimens during the acute phase of infection. The lowest concentration of SARS-CoV-2 viral copies this assay can detect is 250 copies / mL. A negative result does not preclude SARS-CoV-2 infection and should not be used as the sole basis for treatment or other patient management decisions.  A negative result may occur with improper specimen collection / handling, submission of specimen other than nasopharyngeal swab, presence of viral mutation(s) within the areas targeted by this assay, and inadequate number of viral copies (<250 copies / mL). A negative result must be combined with clinical observations, patient history, and epidemiological information.  Fact Sheet for Patients:   https://www.patel.info/  Fact Sheet for Healthcare Providers: https://hall.com/  This test is not yet approved or  cleared by the [place] FDA and has been authorized for detection and/or diagnosis of SARS-CoV-2 by FDA under an Emergency Use Authorization (EUA).  This EUA will remain in effect (meaning this test can be used) for the duration of the COVID-19 declaration under Section 564(b)(1) of the Act, 21 U.S.C. section 360bbb-3(b)(1), unless the authorization is terminated or revoked sooner.  Performed at [facility], [address] [place], [place] [address]   Culture, body fluid w Gram Stain-bottle  Status: None  (Preliminary result)   Collection Time: 01/24/22 11:08 AM   Specimen: Peritoneal Washings  Result Value Ref Range Status   Specimen Description PERITONEAL  Final   Special Requests NONE  Final   Culture   Final    NO GROWTH < 24 HOURS Performed at [facility], [place] [address] [place], [place] [address]    Report Status PENDING  Incomplete  Gram stain     Status: None   Collection Time: 01/24/22 11:08 AM   Specimen: Peritoneal Washings  Result Value Ref Range Status   Specimen Description PERITONEAL  Final   Special Requests NONE  Final   Gram Stain   Final    NO WBC SEEN NO ORGANISMS SEEN Performed at [facility], [address], [place], [place] [address]    Report Status 01/24/2022 FINAL  Final  Gastrointestinal Panel by PCR , Stool     Status: None   Collection Time: 01/24/22  8:01 PM   Specimen: STOOL  Result Value Ref Range Status   Campylobacter species NOT DETECTED NOT DETECTED Final   Plesimonas shigelloides NOT DETECTED NOT DETECTED Final   Salmonella species NOT DETECTED NOT DETECTED Final   Yersinia enterocolitica NOT DETECTED NOT DETECTED Final   Vibrio species NOT DETECTED NOT DETECTED Final   Vibrio cholerae NOT DETECTED NOT DETECTED Final   Enteroaggregative E coli (EAEC) NOT DETECTED NOT DETECTED Final   Enteropathogenic E coli (EPEC) NOT DETECTED NOT DETECTED Final   Enterotoxigenic E coli (ETEC) NOT DETECTED NOT DETECTED Final   Shiga like toxin producing E coli (STEC) NOT DETECTED NOT DETECTED Final   Shigella/Enteroinvasive E coli (EIEC) NOT DETECTED NOT DETECTED Final   Cryptosporidium NOT DETECTED NOT DETECTED Final   Cyclospora cayetanensis NOT DETECTED NOT DETECTED Final   Entamoeba histolytica NOT DETECTED NOT DETECTED Final   Giardia lamblia NOT DETECTED NOT DETECTED Final   Adenovirus F40/41 NOT DETECTED NOT DETECTED Final   Astrovirus NOT DETECTED NOT DETECTED Final   Norovirus GI/GII NOT DETECTED NOT DETECTED Final   Rotavirus A  NOT DETECTED NOT DETECTED Final   Sapovirus (I, II, IV, and V) NOT DETECTED NOT DETECTED Final    Comment: Performed at [facility], [address] [place], [place] [address]  C Difficile Quick Screen w PCR reflex     Status: None   Collection Time: 01/24/22  8:01 PM   Specimen: STOOL  Result Value Ref Range Status   C Diff antigen NEGATIVE NEGATIVE Final   C Diff toxin NEGATIVE NEGATIVE Final   C Diff interpretation No C. difficile detected.  Final    Comment: Performed at [facility], [place] [address] [place], [place] [address]     Time coordinating discharge: Over 30 minutes  SIGNED:   Little Ishikawa, DO [facility] 01/25/2022, 1:33 PM Pager   If 7PM-7AM, please contact night-coverage [email or web address]

## 2022-01-25 NOTE — Progress Notes (Addendum)
D/C order noted. [place] and spoke to [place], [place] RN. Anderson Malta aware pt is for d/c today. Will fax d/c summary to clinic for continuation of care once completed.   Melven Sartorius Renal Navigator [phone]  Late Entry Addendum: D/C summary faxed to clinic on the morning of 6/1 [phone]).

## 2022-01-25 NOTE — Progress Notes (Incomplete)
Initial Nutrition Assessment  DOCUMENTATION CODES:      INTERVENTION:  *** Add Renal MVI daily   NUTRITION DIAGNOSIS:     related to   as evidenced by  .  ***  GOAL:      ***  MONITOR:      REASON FOR ASSESSMENT:   Malnutrition Screening Tool    ASSESSMENT:    26 yo female admitted with abd pain with recent diarrhea. PMH includes Type 1 DM, ESRD on PD (initiated 05/2021), anemia, bipolar , CHF, disorder   1.5% low Mg/low Ca via CAPD  Labs: CBGs 189-300 Meds: calcium carbonate prn for indigestion, ferrous sulfate, ss novolog, insulin pump per pt, mag ox, reglan prn, carafate ***   NUTRITION - FOCUSED PHYSICAL EXAM:  {RD Focused Exam List:21252}  Diet Order:   Diet Order             Diet clear liquid Room service appropriate? Yes; Fluid consistency: Thin  Diet effective now                   EDUCATION NEEDS:      Skin:  Skin Assessment: Reviewed RN Assessment  Last BM:  5/30  Height:   Ht Readings from Last 1 Encounters:  01/24/22 5' (1.524 m)    Weight:   Wt Readings from Last 1 Encounters:  01/25/22 64.1 kg    Ideal Body Weight:     BMI:  Body mass index is 27.6 kg/m.  Estimated Nutritional Needs:   Kcal:     Protein:     Fluid:      Kerman Passey MS, RDN, LDN, CNSC Registered Dietitian III Clinical Nutrition RD Pager and On-Call Pager Number Located in [place]

## 2022-01-25 NOTE — TOC Transition Note (Signed)
Transition of Care [facility]) - CM/SW Discharge Note   Patient Details  Name: Kathy Lewis MRN: [id number] Date of Birth: 18-Oct-1995  [facility] CM/SW Contact:  Tom-Johnson, Renea Ee, RN Phone Number: 01/25/2022, 2:14 PM   Clinical Narrative:     Patient is scheduled for discharge today. Admitted for Abdominal pain. States she has been to the hospital several times in the past weeks.  Does Peritoneal Dialysis at home by [facility], PD unit.  From home with daughter. Parents, three siblings are supportive with care.  Currently on disability. Independent with care and drive self to and from appointments. Does not have any DME's at home.   PCP is at Center, could not remember name and uses [facility] on [address].   Cab voucher given for transportation at discharge. No further TOC needs noted.   Final next level of care: Home/Self Care Barriers to Discharge: Barriers Resolved   Patient Goals and CMS Choice Patient states their goals for this hospitalization and ongoing recovery are:: To return home CMS Medicare.gov Compare Post Acute Care list provided to:: Patient Choice offered to / list presented to : NA  Discharge Placement                Patient to be transferred to facility by: [facility]      Discharge Plan and Services                DME Arranged: N/A DME Agency: NA       HH Arranged: NA HH Agency: NA        Social Determinants of Health (SDOH) Interventions     Readmission Risk Interventions    08/12/2021    1:11 PM  Readmission Risk Prevention Plan  Transportation Screening Complete  Medication Review Press photographer) Complete  PCP or Specialist appointment within 3-5 days of discharge Complete  HRI or [place] Complete  SW Recovery Care/Counseling Consult Complete  [place] Not Applicable

## 2022-01-25 NOTE — Progress Notes (Signed)
[facility] Progress Note  Subjective: pt seen in room.   Vitals:   01/24/22 1918 01/24/22 2046 01/25/22 0502 01/25/22 0912  BP:  123/85 (!) 124/97 119/84  Pulse:  89 93 89  Resp:  18 18 16   Temp:  98.9 F (37.2 C) 98.3 F (36.8 C) 98.2 F (36.8 C)  TempSrc:   Oral Oral  SpO2:  99% 98% 97%  Weight: 70.8 kg  64.1 kg   Height:        Exam: Gen alert, no distress No jvd or bruits Chest clear bilat to bases RRR no RG Abd soft ntnd no mass or ascites +bs RLQ PD cath w/ clean exit site Ext no LE edema Neuro is alert, Ox 3 , nf      Home meds include - atorvastatin, fioricet, rocaltrol, carvedilol 6.125 bid, entresto bid, ferosul, fluticasone, gentamicin cream, hydralazine 100 tid, insulin lispro, isosorbide mononitrate 30, mag ox, medroxyprogesterone 150 mg IM q 29mos, metoclopramide 10 q 8 prn, prochlorperazine, ergocalciferol 50K weekly   CT abd/ pelvis 5/27 - IMPRESSION: 1. No acute findings. No evidence of bowel obstruction or inflammation. 2. Mild cardiomegaly, small pericardial effusion, small amount of ascites and diffuse subcutaneous soft tissue edema, new since the prior CT.  CXR 5/30 - unremarkable    OP PD: [facility], Dr Olivia Mackie  4 exchange per night, no day bag, fill vol 1500 cc, dwell time ?, dry wt ?     Assessment/ Plan: Abdominal pain - recent diarrhea last week, resolved. Relatively new PD patient, started Oct 2022. Abdomen exam is benign, fluid is grossly clear, peritoneal fluid cell count is normal, and gram stain shows no organisms. She does not have peritonitis.   ESRD - on PD. Cont PD nightly while here.  HTN - uncontrolled, takes coreg, hydralazine and entresto at home. Cont here. No vol excess on exam but BP's up, will use all 2.5% fluids w/ pd for now.  Anemia esrd - Hb 10-11 range, no esa needs for now MBD ckd - CCa in range, will add on phos. Not sure if on binder.  CM - last echo EF 30%, on entresto and coreg Type 1 DM - per  pmd Dispo - stable for d/c from renal standpoint          Rob Grenda Lora 01/25/2022, 10:43 AM   Recent Labs  Lab 01/22/22 1647 01/24/22 0800  HGB 10.7* 10.1*  ALBUMIN 2.5* 2.5*  CALCIUM 8.1* 8.1*  CREATININE 9.18* 9.63*  K 4.7 5.0   Inpatient medications:  atorvastatin  80 mg Oral Daily   calcitRIOL  0.25 mcg Oral Daily   carvedilol  6.25 mg Oral BID WC   ferrous sulfate  325 mg Oral TID   fluticasone  1 spray Each Nare BID   gentamicin cream  1 application. Topical Daily   heparin  5,000 Units Subcutaneous Q8H   hydrALAZINE  100 mg Oral TID   insulin aspart  0-6 Units Subcutaneous Q4H   insulin pump   Subcutaneous TID WC, HS, 0200   isosorbide mononitrate  30 mg Oral Daily   magnesium oxide  800 mg Oral Daily   pantoprazole (PROTONIX) IV  40 mg Intravenous Q24H   sacubitril-valsartan  1 tablet Oral BID   sodium chloride flush  3 mL Intravenous Q12H   sucralfate  1 g Oral TID WC & HS    dialysis solution 1.5% low-MG/low-CA     acetaminophen **OR** acetaminophen, calcium carbonate (dosed in mg elemental  calcium), camphor-menthol **AND** hydrOXYzine, docusate sodium, feeding supplement (NEPRO CARB STEADY), hydrALAZINE, metoCLOPramide, prochlorperazine, sorbitol, zolpidem

## 2022-01-29 LAB — CULTURE, BODY FLUID W GRAM STAIN -BOTTLE: Culture: NO GROWTH

## 2022-01-30 ENCOUNTER — Emergency Department (HOSPITAL_COMMUNITY)
Admission: EM | Admit: 2022-01-30 | Discharge: 2022-01-30 | Discharge status: Against Medical Advice | Payer: Medicaid Other | Source: Home / Self Care

## 2022-01-30 ENCOUNTER — Encounter (HOSPITAL_COMMUNITY): Payer: Self-pay | Admitting: Emergency Medicine

## 2022-01-30 ENCOUNTER — Inpatient Hospital Stay (HOSPITAL_BASED_OUTPATIENT_CLINIC_OR_DEPARTMENT_OTHER)
Admission: EM | Admit: 2022-01-30 | Discharge: 2022-02-01 | DRG: 189 | Disposition: A | Discharge status: Home / Self Care | Payer: Medicaid Other | Attending: Internal Medicine | Admitting: Internal Medicine

## 2022-01-30 ENCOUNTER — Emergency Department (HOSPITAL_BASED_OUTPATIENT_CLINIC_OR_DEPARTMENT_OTHER): Payer: Medicaid Other

## 2022-01-30 ENCOUNTER — Encounter (HOSPITAL_BASED_OUTPATIENT_CLINIC_OR_DEPARTMENT_OTHER): Payer: Self-pay | Admitting: Pediatrics

## 2022-01-30 ENCOUNTER — Other Ambulatory Visit: Payer: Self-pay

## 2022-01-30 DIAGNOSIS — J810 Acute pulmonary edema: Secondary | ICD-10-CM | POA: Diagnosis present

## 2022-01-30 DIAGNOSIS — K3184 Gastroparesis: Secondary | ICD-10-CM | POA: Diagnosis present

## 2022-01-30 DIAGNOSIS — I509 Heart failure, unspecified: Secondary | ICD-10-CM | POA: Insufficient documentation

## 2022-01-30 DIAGNOSIS — I10 Essential (primary) hypertension: Secondary | ICD-10-CM | POA: Diagnosis present

## 2022-01-30 DIAGNOSIS — Z888 Allergy status to other drugs, medicaments and biological substances status: Secondary | ICD-10-CM

## 2022-01-30 DIAGNOSIS — R066 Hiccough: Secondary | ICD-10-CM | POA: Diagnosis present

## 2022-01-30 DIAGNOSIS — E1065 Type 1 diabetes mellitus with hyperglycemia: Secondary | ICD-10-CM | POA: Diagnosis present

## 2022-01-30 DIAGNOSIS — Z794 Long term (current) use of insulin: Secondary | ICD-10-CM

## 2022-01-30 DIAGNOSIS — J9601 Acute respiratory failure with hypoxia: Principal | ICD-10-CM | POA: Diagnosis present

## 2022-01-30 DIAGNOSIS — D631 Anemia in chronic kidney disease: Secondary | ICD-10-CM | POA: Diagnosis present

## 2022-01-30 DIAGNOSIS — Z20822 Contact with and (suspected) exposure to covid-19: Secondary | ICD-10-CM | POA: Diagnosis present

## 2022-01-30 DIAGNOSIS — F419 Anxiety disorder, unspecified: Secondary | ICD-10-CM | POA: Diagnosis present

## 2022-01-30 DIAGNOSIS — Z91018 Allergy to other foods: Secondary | ICD-10-CM

## 2022-01-30 DIAGNOSIS — I132 Hypertensive heart and chronic kidney disease with heart failure and with stage 5 chronic kidney disease, or end stage renal disease: Secondary | ICD-10-CM | POA: Diagnosis present

## 2022-01-30 DIAGNOSIS — E10649 Type 1 diabetes mellitus with hypoglycemia without coma: Secondary | ICD-10-CM | POA: Diagnosis present

## 2022-01-30 DIAGNOSIS — E1022 Type 1 diabetes mellitus with diabetic chronic kidney disease: Secondary | ICD-10-CM | POA: Diagnosis present

## 2022-01-30 DIAGNOSIS — R112 Nausea with vomiting, unspecified: Principal | ICD-10-CM | POA: Diagnosis present

## 2022-01-30 DIAGNOSIS — Z886 Allergy status to analgesic agent status: Secondary | ICD-10-CM

## 2022-01-30 DIAGNOSIS — Z5321 Procedure and treatment not carried out due to patient leaving prior to being seen by health care provider: Secondary | ICD-10-CM | POA: Insufficient documentation

## 2022-01-30 DIAGNOSIS — Z9641 Presence of insulin pump (external) (internal): Secondary | ICD-10-CM | POA: Diagnosis present

## 2022-01-30 DIAGNOSIS — Z87891 Personal history of nicotine dependence: Secondary | ICD-10-CM

## 2022-01-30 DIAGNOSIS — Z91148 Patient's other noncompliance with medication regimen for other reason: Secondary | ICD-10-CM

## 2022-01-30 DIAGNOSIS — Z91158 Patient's noncompliance with renal dialysis for other reason: Secondary | ICD-10-CM

## 2022-01-30 DIAGNOSIS — Z79899 Other long term (current) drug therapy: Secondary | ICD-10-CM

## 2022-01-30 DIAGNOSIS — Z992 Dependence on renal dialysis: Secondary | ICD-10-CM

## 2022-01-30 DIAGNOSIS — I5023 Acute on chronic systolic (congestive) heart failure: Secondary | ICD-10-CM | POA: Diagnosis present

## 2022-01-30 DIAGNOSIS — E782 Mixed hyperlipidemia: Secondary | ICD-10-CM | POA: Diagnosis present

## 2022-01-30 DIAGNOSIS — R9431 Abnormal electrocardiogram [ECG] [EKG]: Secondary | ICD-10-CM | POA: Diagnosis present

## 2022-01-30 DIAGNOSIS — N186 End stage renal disease: Secondary | ICD-10-CM | POA: Diagnosis present

## 2022-01-30 DIAGNOSIS — E1069 Type 1 diabetes mellitus with other specified complication: Secondary | ICD-10-CM | POA: Diagnosis present

## 2022-01-30 DIAGNOSIS — F3181 Bipolar II disorder: Secondary | ICD-10-CM | POA: Diagnosis present

## 2022-01-30 DIAGNOSIS — E1043 Type 1 diabetes mellitus with diabetic autonomic (poly)neuropathy: Secondary | ICD-10-CM | POA: Diagnosis present

## 2022-01-30 LAB — COMPREHENSIVE METABOLIC PANEL
ALT: 14 U/L (ref 0–44)
ALT: 13 U/L (ref 0–44)
AST: 17 U/L (ref 15–41)
AST: 18 U/L (ref 15–41)
Albumin: 2.7 g/dL — ABNORMAL LOW (ref 3.5–5.0)
Albumin: 3.1 g/dL — ABNORMAL LOW (ref 3.5–5.0)
Alkaline Phosphatase: 58 U/L (ref 38–126)
Alkaline Phosphatase: 67 U/L (ref 38–126)
Anion gap: 10 (ref 5–15)
Anion gap: 12 (ref 5–15)
BUN: 60 mg/dL — ABNORMAL HIGH (ref 6–20)
BUN: 61 mg/dL — ABNORMAL HIGH (ref 6–20)
CO2: 23 mmol/L (ref 22–32)
CO2: 23 mmol/L (ref 22–32)
Calcium: 8.1 mg/dL — ABNORMAL LOW (ref 8.9–10.3)
Calcium: 8.4 mg/dL — ABNORMAL LOW (ref 8.9–10.3)
Chloride: 105 mmol/L (ref 98–111)
Chloride: 103 mmol/L (ref 98–111)
Creatinine, Ser: 9.58 mg/dL — ABNORMAL HIGH (ref 0.44–1.00)
Creatinine, Ser: 9.21 mg/dL — ABNORMAL HIGH (ref 0.44–1.00)
GFR, Estimated: 5 mL/min — ABNORMAL LOW (ref >60)
GFR, Estimated: 6 mL/min — ABNORMAL LOW (ref >60)
Glucose, Bld: 180 mg/dL — ABNORMAL HIGH (ref 70–99)
Glucose, Bld: 174 mg/dL — ABNORMAL HIGH (ref 70–99)
Potassium: 4.5 mmol/L (ref 3.5–5.1)
Potassium: 4.5 mmol/L (ref 3.5–5.1)
Sodium: 138 mmol/L (ref 135–145)
Sodium: 138 mmol/L (ref 135–145)
Total Bilirubin: 0.8 mg/dL (ref 0.3–1.2)
Total Bilirubin: 0.7 mg/dL (ref 0.3–1.2)
Total Protein: 5.6 g/dL — ABNORMAL LOW (ref 6.5–8.1)
Total Protein: 6.9 g/dL (ref 6.5–8.1)

## 2022-01-30 LAB — CBC
HCT: 36.9 % (ref 36.0–46.0)
HCT: 36.6 % (ref 36.0–46.0)
Hemoglobin: 11.9 g/dL — ABNORMAL LOW (ref 12.0–15.0)
Hemoglobin: 12.2 g/dL (ref 12.0–15.0)
MCH: 31.6 pg (ref 26.0–34.0)
MCH: 31.7 pg (ref 26.0–34.0)
MCHC: 32.2 g/dL (ref 30.0–36.0)
MCHC: 33.3 g/dL (ref 30.0–36.0)
MCV: 97.9 fL (ref 80.0–100.0)
MCV: 95.1 fL (ref 80.0–100.0)
Platelets: 241 10*3/uL (ref 150–400)
Platelets: 256 10*3/uL (ref 150–400)
RBC: 3.77 MIL/uL — ABNORMAL LOW (ref 3.87–5.11)
RBC: 3.85 MIL/uL — ABNORMAL LOW (ref 3.87–5.11)
RDW: 13.5 % (ref 11.5–15.5)
RDW: 13.8 % (ref 11.5–15.5)
WBC: 6.3 10*3/uL (ref 4.0–10.5)
WBC: 6.5 10*3/uL (ref 4.0–10.5)
nRBC: 0 % (ref 0.0–0.2)
nRBC: 0 % (ref 0.0–0.2)

## 2022-01-30 LAB — LIPASE, BLOOD: Lipase: 24 U/L (ref 11–51)

## 2022-01-30 LAB — SARS CORONAVIRUS 2 BY RT PCR: SARS Coronavirus 2 by RT PCR: NEGATIVE

## 2022-01-30 LAB — I-STAT BETA HCG BLOOD, ED (MC, WL, AP ONLY): I-stat hCG, quantitative: <5 m[IU]/mL (ref <5)

## 2022-01-30 MED ORDER — LORAZEPAM 2 MG/ML IJ SOLN: 0.5000 mg | Freq: Once | INTRAMUSCULAR | Status: DC

## 2022-01-30 MED ORDER — FUROSEMIDE 10 MG/ML IJ SOLN
20.0000 mg | Freq: Once | INTRAMUSCULAR | Status: AC
  Administered 2022-01-30: 20 mg via INTRAVENOUS
  Filled 2022-01-30: qty 2

## 2022-01-30 MED ORDER — OXYCODONE-ACETAMINOPHEN 5-325 MG PO TABS
1.0000 | ORAL_TABLET | Freq: Once | ORAL | Status: AC
  Administered 2022-01-30: 1 via ORAL
  Filled 2022-01-30: qty 1

## 2022-01-30 MED ORDER — LORAZEPAM 2 MG/ML IJ SOLN
0.5000 mg | Freq: Once | INTRAMUSCULAR | Status: AC
  Administered 2022-01-30: 0.5 mg via INTRAVENOUS
  Filled 2022-01-30: qty 1

## 2022-01-30 MED ORDER — SODIUM CHLORIDE 0.9 % IV BOLUS: 250.0000 mL | Freq: Once | INTRAVENOUS | Status: DC

## 2022-01-30 MED ORDER — SODIUM CHLORIDE 0.9 % IV BOLUS: 500.0000 mL | Freq: Once | INTRAVENOUS | Status: DC

## 2022-01-30 MED ORDER — METOCLOPRAMIDE HCL 10 MG PO TABS
10.0000 mg | ORAL_TABLET | Freq: Once | ORAL | Status: AC
  Administered 2022-01-30: 10 mg via ORAL
  Filled 2022-01-30: qty 1

## 2022-01-30 NOTE — ED Notes (Signed)
Patient still c/o nausea.  MD made aware.

## 2022-01-30 NOTE — ED Provider Notes (Signed)
Emergency Department Provider Note   I have reviewed the triage vital signs and the nursing notes.   HISTORY  Chief Complaint Emesis, Diarrhea, and Abdominal Pain   HPI Kathy Lewis is a 26 y.o. female with past medical history reviewed below including type 1 diabetes and intermittent nausea/vomiting presents to the emergency department with return of epigastric abdominal pain along with nausea and vomiting.  She describes dry heaves and vomiting over the past 12 hours which has been progressively worsening.  She is not having diffuse abdominal discomfort or fever.  No chills.  She initially presented to [facility] and underwent MSE along with lab work but ultimately left due to [facility] wait times. No CP or SOB.   Past Medical History:  Diagnosis Date   Anxiety    Bipolar 2 disorder ([place])    Chronic systolic (congestive) heart failure (HCC)    Depression    DKA (diabetic ketoacidoses)    ESRD on peritoneal dialysis ([place])    HSV infection    on valtrex   Hypokalemia    Leukocytosis    Migraine    Noncompliance with medication regimen    Preeclampsia    Severe anemia    Type 1 diabetes mellitus ([place])     Review of Systems  Constitutional: No fever/chills Eyes: No visual changes. ENT: No sore throat. Cardiovascular: Denies chest pain. Respiratory: Denies shortness of breath. Gastrointestinal: Positive epigastric abdominal pain. Positive nausea, vomiting.  No diarrhea.  No constipation. Genitourinary: Negative for dysuria. Musculoskeletal: Negative for back pain. Skin: Negative for rash. Neurological: Negative for headaches, focal weakness or numbness.   ____________________________________________   PHYSICAL EXAM:  VITAL SIGNS: ED Triage Vitals  Enc Vitals Group     BP 01/30/22 1706 (!) 163/125     Pulse Rate 01/30/22 1706 (!) 104     Resp 01/30/22 1706 20     Temp 01/30/22 1706 98.4 F (36.9 C)     Temp Source 01/30/22 1706 Oral     SpO2 01/30/22 1706 98  %     Weight 01/30/22 1658 130 lb (59 kg)     Height 01/30/22 1658 5' (1.524 m)   Constitutional: Slightly drowsy but able to provide a full history. No acute distress.  Eyes: Conjunctivae are normal. Head: Atraumatic. Nose: No congestion/rhinnorhea. Mouth/Throat: Mucous membranes are dry.  Neck: No stridor.  Cardiovascular: Tachycardia. Good peripheral circulation. Grossly normal heart sounds.   Respiratory: Normal respiratory effort.  No retractions. Lungs CTAB. Gastrointestinal: Soft with mild epigastric tenderness. No tenderness in remaining quadrants. No distention.  Musculoskeletal:  No gross deformities of extremities. Neurologic:  Normal speech and language. No gross focal neurologic deficits are appreciated.  Skin:  Skin is warm, dry and intact. No rash noted.   ____________________________________________   LABS (all labs ordered are listed, but only abnormal results are displayed)  Labs Reviewed  COMPREHENSIVE METABOLIC PANEL - Abnormal; Notable for the following components:      Result Value   Glucose, Bld 174 (*)    BUN 61 (*)    Creatinine, Ser 9.21 (*)    Calcium 8.4 (*)    Albumin 3.1 (*)    GFR, Estimated 6 (*)    All other components within normal limits  CBC - Abnormal; Notable for the following components:   RBC 3.85 (*)    All other components within normal limits  SARS CORONAVIRUS 2 BY RT PCR   ____________________________________________  EKG   EKG Interpretation  Date/Time:  Monday January 30 2022 18:37:46 EDT Ventricular Rate:  108 PR Interval:  139 QRS Duration: 92 QT Interval:  387 QTC Calculation: 519 R Axis:   -30 Text Interpretation: Sinus tachycardia Probable left atrial enlargement Left axis deviation Borderline T abnormalities, lateral leads Prolonged QT interval Similar to prior Confirmed by Nanda Quinton [phone]) on 01/30/2022 10:15:12 PM        ____________________________________________  RADIOLOGY  DG Chest Portable 1  View  Result Date: 01/30/2022 CLINICAL DATA:  Chest pain EXAM: PORTABLE CHEST 1 VIEW COMPARISON:  01/24/2022 FINDINGS: Cardiomegaly with vascular congestion and probable mild interstitial edema. No pleural effusion or pneumothorax. IMPRESSION: Cardiomegaly with vascular congestion and mild interstitial edema Electronically Signed   By: Donavan Foil M.D.   On: 01/30/2022 19:35    ____________________________________________   PROCEDURES  Procedure(s) performed:   Procedures  CRITICAL CARE Performed by: Margette Fast Total critical care time: 35 minutes Critical care time was exclusive of separately billable procedures and treating other patients. Critical care was necessary to treat or prevent imminent or life-threatening deterioration. Critical care was time spent personally by me on the following activities: development of treatment plan with patient and/or surrogate as well as nursing, discussions with consultants, evaluation of patient's response to treatment, examination of patient, obtaining history from patient or surrogate, ordering and performing treatments and interventions, ordering and review of laboratory studies, ordering and review of radiographic studies, pulse oximetry and re-evaluation of patient's condition.  Nanda Quinton, MD Emergency Medicine  ____________________________________________   INITIAL IMPRESSION / ASSESSMENT AND PLAN / ED COURSE  Pertinent labs & imaging results that were available during my care of the patient were reviewed by me and considered in my medical decision making (see chart for details).   This patient is Presenting for Evaluation of abdominal pain, which does require a range of treatment options, and is a complaint that involves a high risk of morbidity and mortality.  The Differential Diagnoses includes but is not exclusive to acute cholecystitis, intrathoracic causes for epigastric abdominal pain, gastritis, duodenitis, pancreatitis,  small bowel or large bowel obstruction, abdominal aortic aneurysm, hernia, gastritis, etc.   Critical Interventions-    Medications  oxyCODONE-acetaminophen (PERCOCET/ROXICET) 5-325 MG per tablet 1 tablet (1 tablet Oral Given 01/30/22 1807)  metoCLOPramide (REGLAN) tablet 10 mg (10 mg Oral Given 01/30/22 1807)  furosemide (LASIX) injection 20 mg (20 mg Intravenous Given 01/30/22 2033)  LORazepam (ATIVAN) injection 0.5 mg (0.5 mg Intravenous Given 01/30/22 2210)    Reassessment after intervention: Symptoms improved.    I did obtain Additional Historical Information from family at bedside.  I decided to review pertinent External Data, and in summary patient with prior admits/ED visits with last admit in 5/30 with SBP considered but ruled out.   Clinical Laboratory Tests Ordered, included CBC without leukocytosis or severe anemia.  Patient with end-stage renal disease but only mild elevated blood sugar.  Normal anion gap. Normal K+.  Radiologic Tests Ordered, included CXR. I independently interpreted the images and agree with radiology interpretation.   Cardiac Monitor Tracing which shows NSR.   Social Determinants of Health Risk patient is a former smoker.   Consult complete with Hospitalist, Dr. Myna Hidalgo. Plan for admit.   Medical Decision Making: Summary:  Patient presents emergency department with intractable nausea and vomiting.  She has epigastric pain.  Considering gastroparesis although patient does not have a formal diagnosis of this she would be at risk.  She does not appear to be in DKA.  She had an episode of hypoxemia here although is in no acute respiratory distress.  The chest x-ray does show some mild pulmonary edema.  Discussed admit with the hospitalist.  I am managing home medications with non-QT prolonging medication.   Reevaluation with update and discussion with patient and family who are in agreement with plan for admit.   Disposition:  admit  ____________________________________________  FINAL CLINICAL IMPRESSION(S) / ED DIAGNOSES  Final diagnoses:  Intractable nausea and vomiting  Acute pulmonary edema (HCC)  Prolonged Q-T interval on ECG     Note:  This document was prepared using Dragon voice recognition software and may include unintentional dictation errors.  Nanda Quinton, MD, [facility] Emergency Medicine    Lataja Newland, [facility], MD 01/30/22 2220

## 2022-01-30 NOTE — ED Provider Triage Note (Signed)
Emergency Medicine Provider Triage Evaluation Note  Kathy Lewis , a 26 y.o. female  was evaluated in triage.  Pt complains of vomiting  Review of Systems  Positive: Abdominal pain Negative: fever  Physical Exam  BP (!) 162/122 (BP Location: Right Arm)   Pulse 98   Temp 97.6 F (36.4 C) (Oral)   Resp 16   SpO2 100%  Gen:   Awake, no distress   Resp:  Normal effort  MSK:   Moves extremities without difficulty  Other:    Medical Decision Making  Medically screening exam initiated at 2:05 PM.  Appropriate orders placed.  Kathy Lewis was informed that the remainder of the evaluation will be completed by another provider, this initial triage assessment does not replace that evaluation, and the importance of remaining in the ED until their evaluation is complete.     Kathy Lewis, [place] 01/30/22 1406

## 2022-01-30 NOTE — ED Triage Notes (Signed)
Arrived POV; c/o ongoing NVD; reported was seen at [facility] ED; and left due to wait times.

## 2022-01-30 NOTE — ED Provider Triage Note (Signed)
Emergency Medicine Provider Triage Evaluation Note  Kathy Lewis , a 26 y.o. female  was evaluated in triage.  Pt complains of nausea, vomiting, and diarrhea. No abdominal pain.   Review of Systems  Positive: Nausea/vomiting. No severe abdominal pain.  Negative: CP, SOB, and fever  Physical Exam  BP (!) 163/125 (BP Location: Right Arm)   Pulse (!) 104   Temp 98.4 F (36.9 C) (Oral)   Resp 20   Ht 5' (1.524 m)   Wt 59 kg   SpO2 98%   BMI 25.39 kg/m  Gen:   Awake, no distress. Patient with some dry heaving.  Resp:  Normal effort  MSK:   Moves extremities without difficulty  Other:  Normal mental status   Medical Decision Making  Medically screening exam initiated at 5:24 PM.  Appropriate orders placed.  Kathy Lewis was informed that the remainder of the evaluation will be completed by another provider, this initial triage assessment does not replace that evaluation, and the importance of remaining in the ED until their evaluation is complete.     Margette Fast, MD 01/30/22 1728

## 2022-01-30 NOTE — ED Notes (Signed)
Patient consents to transfer but is unable to sign at this time d/t emesis.

## 2022-01-30 NOTE — ED Notes (Signed)
Patient placed back on 2 L [place] d/t oxygen saturations continuing to drop.

## 2022-01-30 NOTE — ED Notes (Signed)
Patient nauseous with one episode of vomiting.  Patient given alcohol swabs to sniff per MD Long.

## 2022-01-30 NOTE — ED Triage Notes (Signed)
Patient BIB [facility] from home for  compliant of persistent nausea and vomiting over the last several weeks. History of CHF and does peritoneal dialysis nightly. Patient is alert, oriented, and in no apparent distress. Received 4mg  zofran IM PTA from EMS.

## 2022-01-31 ENCOUNTER — Encounter (HOSPITAL_COMMUNITY): Payer: Self-pay | Admitting: Family Medicine

## 2022-01-31 DIAGNOSIS — E1069 Type 1 diabetes mellitus with other specified complication: Secondary | ICD-10-CM | POA: Diagnosis present

## 2022-01-31 DIAGNOSIS — I132 Hypertensive heart and chronic kidney disease with heart failure and with stage 5 chronic kidney disease, or end stage renal disease: Secondary | ICD-10-CM | POA: Diagnosis present

## 2022-01-31 DIAGNOSIS — Z992 Dependence on renal dialysis: Secondary | ICD-10-CM | POA: Diagnosis not present

## 2022-01-31 DIAGNOSIS — E1065 Type 1 diabetes mellitus with hyperglycemia: Secondary | ICD-10-CM | POA: Diagnosis present

## 2022-01-31 DIAGNOSIS — J810 Acute pulmonary edema: Secondary | ICD-10-CM | POA: Diagnosis not present

## 2022-01-31 DIAGNOSIS — J9601 Acute respiratory failure with hypoxia: Secondary | ICD-10-CM | POA: Diagnosis present

## 2022-01-31 DIAGNOSIS — E1043 Type 1 diabetes mellitus with diabetic autonomic (poly)neuropathy: Secondary | ICD-10-CM | POA: Diagnosis present

## 2022-01-31 DIAGNOSIS — Z886 Allergy status to analgesic agent status: Secondary | ICD-10-CM | POA: Diagnosis not present

## 2022-01-31 DIAGNOSIS — R9431 Abnormal electrocardiogram [ECG] [EKG]: Secondary | ICD-10-CM | POA: Diagnosis present

## 2022-01-31 DIAGNOSIS — Z9641 Presence of insulin pump (external) (internal): Secondary | ICD-10-CM | POA: Diagnosis present

## 2022-01-31 DIAGNOSIS — Z91018 Allergy to other foods: Secondary | ICD-10-CM | POA: Diagnosis not present

## 2022-01-31 DIAGNOSIS — Z888 Allergy status to other drugs, medicaments and biological substances status: Secondary | ICD-10-CM | POA: Diagnosis not present

## 2022-01-31 DIAGNOSIS — Z91158 Patient's noncompliance with renal dialysis for other reason: Secondary | ICD-10-CM | POA: Diagnosis not present

## 2022-01-31 DIAGNOSIS — R112 Nausea with vomiting, unspecified: Secondary | ICD-10-CM | POA: Diagnosis not present

## 2022-01-31 DIAGNOSIS — I5023 Acute on chronic systolic (congestive) heart failure: Secondary | ICD-10-CM

## 2022-01-31 DIAGNOSIS — E1022 Type 1 diabetes mellitus with diabetic chronic kidney disease: Secondary | ICD-10-CM | POA: Diagnosis present

## 2022-01-31 DIAGNOSIS — Z79899 Other long term (current) drug therapy: Secondary | ICD-10-CM | POA: Diagnosis not present

## 2022-01-31 DIAGNOSIS — R1110 Vomiting, unspecified: Secondary | ICD-10-CM | POA: Diagnosis present

## 2022-01-31 DIAGNOSIS — Z794 Long term (current) use of insulin: Secondary | ICD-10-CM | POA: Diagnosis not present

## 2022-01-31 DIAGNOSIS — N186 End stage renal disease: Secondary | ICD-10-CM | POA: Diagnosis present

## 2022-01-31 DIAGNOSIS — E10649 Type 1 diabetes mellitus with hypoglycemia without coma: Secondary | ICD-10-CM | POA: Diagnosis present

## 2022-01-31 DIAGNOSIS — R066 Hiccough: Secondary | ICD-10-CM | POA: Diagnosis present

## 2022-01-31 DIAGNOSIS — Z20822 Contact with and (suspected) exposure to covid-19: Secondary | ICD-10-CM | POA: Diagnosis present

## 2022-01-31 DIAGNOSIS — E782 Mixed hyperlipidemia: Secondary | ICD-10-CM

## 2022-01-31 DIAGNOSIS — Z87891 Personal history of nicotine dependence: Secondary | ICD-10-CM | POA: Diagnosis not present

## 2022-01-31 DIAGNOSIS — I10 Essential (primary) hypertension: Secondary | ICD-10-CM

## 2022-01-31 DIAGNOSIS — K3184 Gastroparesis: Secondary | ICD-10-CM | POA: Diagnosis present

## 2022-01-31 DIAGNOSIS — F3181 Bipolar II disorder: Secondary | ICD-10-CM | POA: Diagnosis present

## 2022-01-31 DIAGNOSIS — F419 Anxiety disorder, unspecified: Secondary | ICD-10-CM | POA: Diagnosis present

## 2022-01-31 DIAGNOSIS — D631 Anemia in chronic kidney disease: Secondary | ICD-10-CM | POA: Diagnosis present

## 2022-01-31 LAB — COMPREHENSIVE METABOLIC PANEL
ALT: 11 U/L (ref 0–44)
AST: 17 U/L (ref 15–41)
Albumin: 2.4 g/dL — ABNORMAL LOW (ref 3.5–5.0)
Alkaline Phosphatase: 56 U/L (ref 38–126)
Anion gap: 7 (ref 5–15)
BUN: 61 mg/dL — ABNORMAL HIGH (ref 6–20)
CO2: 24 mmol/L (ref 22–32)
Calcium: 8.4 mg/dL — ABNORMAL LOW (ref 8.9–10.3)
Chloride: 108 mmol/L (ref 98–111)
Creatinine, Ser: 9.61 mg/dL — ABNORMAL HIGH (ref 0.44–1.00)
GFR, Estimated: 5 mL/min — ABNORMAL LOW (ref >60)
Glucose, Bld: 63 mg/dL — ABNORMAL LOW (ref 70–99)
Potassium: 4.2 mmol/L (ref 3.5–5.1)
Sodium: 139 mmol/L (ref 135–145)
Total Bilirubin: 0.7 mg/dL (ref 0.3–1.2)
Total Protein: 5.2 g/dL — ABNORMAL LOW (ref 6.5–8.1)

## 2022-01-31 LAB — CBC WITH DIFFERENTIAL/PLATELET
Abs Immature Granulocytes: 0.01 10*3/uL (ref 0.00–0.07)
Basophils Absolute: 0 10*3/uL (ref 0.0–0.1)
Basophils Relative: 0 %
Eosinophils Absolute: 0 10*3/uL (ref 0.0–0.5)
Eosinophils Relative: 0 %
HCT: 31.7 % — ABNORMAL LOW (ref 36.0–46.0)
Hemoglobin: 10.3 g/dL — ABNORMAL LOW (ref 12.0–15.0)
Immature Granulocytes: 0 %
Lymphocytes Relative: 7 %
Lymphs Abs: 0.4 10*3/uL — ABNORMAL LOW (ref 0.7–4.0)
MCH: 31.3 pg (ref 26.0–34.0)
MCHC: 32.5 g/dL (ref 30.0–36.0)
MCV: 96.4 fL (ref 80.0–100.0)
Monocytes Absolute: 0.3 10*3/uL (ref 0.1–1.0)
Monocytes Relative: 4 %
Neutro Abs: 5.1 10*3/uL (ref 1.7–7.7)
Neutrophils Relative %: 89 %
Platelets: 229 10*3/uL (ref 150–400)
RBC: 3.29 MIL/uL — ABNORMAL LOW (ref 3.87–5.11)
RDW: 13.7 % (ref 11.5–15.5)
WBC: 5.7 10*3/uL (ref 4.0–10.5)
nRBC: 0 % (ref 0.0–0.2)

## 2022-01-31 LAB — GLUCOSE, CAPILLARY
Glucose-Capillary: 112 mg/dL — ABNORMAL HIGH (ref 70–99)
Glucose-Capillary: 34 mg/dL — CL (ref 70–99)
Glucose-Capillary: 68 mg/dL — ABNORMAL LOW (ref 70–99)
Glucose-Capillary: 114 mg/dL — ABNORMAL HIGH (ref 70–99)
Glucose-Capillary: 279 mg/dL — ABNORMAL HIGH (ref 70–99)
Glucose-Capillary: 455 mg/dL — ABNORMAL HIGH (ref 70–99)

## 2022-01-31 LAB — MAGNESIUM: Magnesium: 2.2 mg/dL (ref 1.7–2.4)

## 2022-01-31 LAB — MRSA NEXT GEN BY PCR, NASAL: MRSA by PCR Next Gen: NOT DETECTED

## 2022-01-31 MED ORDER — INSULIN ASPART 100 UNIT/ML IJ SOLN: 0.0000 [IU] | Freq: Four times a day (QID) | INTRAMUSCULAR | Status: DC

## 2022-01-31 MED ORDER — BUTALBITAL-APAP-CAFFEINE 50-325-40 MG PO TABS: 1.0000 | ORAL_TABLET | Freq: Every day | ORAL | Status: DC | PRN

## 2022-01-31 MED ORDER — CALCITRIOL 0.25 MCG PO CAPS
0.2500 ug | ORAL_CAPSULE | Freq: Every day | ORAL | Status: DC
  Administered 2022-01-31: 0.25 ug via ORAL
  Filled 2022-01-31: qty 1

## 2022-01-31 MED ORDER — FLUTICASONE PROPIONATE 50 MCG/ACT NA SUSP
1.0000 | Freq: Every day | NASAL | Status: DC
  Administered 2022-02-01: 1 via NASAL
  Filled 2022-01-31: qty 16

## 2022-01-31 MED ORDER — SACUBITRIL-VALSARTAN 49-51 MG PO TABS
1.0000 | ORAL_TABLET | Freq: Two times a day (BID) | ORAL | Status: DC
Start: 2022-01-31 — End: 2022-02-01
  Administered 2022-01-31 – 2022-02-01 (×3): 1 via ORAL
  Filled 2022-01-31 (×5): qty 1

## 2022-01-31 MED ORDER — ATORVASTATIN CALCIUM 80 MG PO TABS
80.0000 mg | ORAL_TABLET | Freq: Every day | ORAL | Status: DC
  Administered 2022-01-31 – 2022-02-01 (×2): 80 mg via ORAL
  Filled 2022-01-31 (×2): qty 1

## 2022-01-31 MED ORDER — INSULIN ASPART 100 UNIT/ML IJ SOLN
0.0000 [IU] | Freq: Three times a day (TID) | INTRAMUSCULAR | Status: DC
  Administered 2022-02-01: 5 [IU] via SUBCUTANEOUS

## 2022-01-31 MED ORDER — LABETALOL HCL 5 MG/ML IV SOLN
20.0000 mg | INTRAVENOUS | Status: DC | PRN
  Administered 2022-01-31: 20 mg via INTRAVENOUS
  Filled 2022-01-31: qty 4

## 2022-01-31 MED ORDER — INSULIN PUMP
SUBCUTANEOUS | Status: DC
  Filled 2022-01-31: qty 1

## 2022-01-31 MED ORDER — HEPARIN SODIUM (PORCINE) 5000 UNIT/ML IJ SOLN
5000.0000 [IU] | Freq: Three times a day (TID) | INTRAMUSCULAR | Status: DC
  Administered 2022-01-31 – 2022-02-01 (×5): 5000 [IU] via SUBCUTANEOUS
  Filled 2022-01-31 (×5): qty 1

## 2022-01-31 MED ORDER — FENTANYL CITRATE PF 50 MCG/ML IJ SOSY
25.0000 ug | PREFILLED_SYRINGE | INTRAMUSCULAR | Status: DC | PRN
  Administered 2022-01-31: 25 ug via INTRAVENOUS
  Filled 2022-01-31: qty 1

## 2022-01-31 MED ORDER — GENTAMICIN SULFATE 0.1 % EX CREA
1.0000 "application " | TOPICAL_CREAM | Freq: Every day | CUTANEOUS | Status: DC
  Administered 2022-02-01: 1 via TOPICAL
  Filled 2022-01-31: qty 15

## 2022-01-31 MED ORDER — METOCLOPRAMIDE HCL 10 MG PO TABS
10.0000 mg | ORAL_TABLET | Freq: Three times a day (TID) | ORAL | Status: DC | PRN
  Administered 2022-01-31: 10 mg via ORAL
  Filled 2022-01-31 (×2): qty 1

## 2022-01-31 MED ORDER — ISOSORBIDE MONONITRATE ER 30 MG PO TB24
30.0000 mg | ORAL_TABLET | Freq: Every day | ORAL | Status: DC
  Administered 2022-01-31 – 2022-02-01 (×2): 30 mg via ORAL
  Filled 2022-01-31 (×2): qty 1

## 2022-01-31 MED ORDER — INSULIN ASPART 100 UNIT/ML IJ SOLN: 0.0000 [IU] | Freq: Every day | INTRAMUSCULAR | Status: DC

## 2022-01-31 MED ORDER — METOCLOPRAMIDE HCL 5 MG/ML IJ SOLN
10.0000 mg | Freq: Three times a day (TID) | INTRAMUSCULAR | Status: DC | PRN
Start: 2022-01-31 — End: 2022-02-01
  Administered 2022-01-31 (×2): 10 mg via INTRAVENOUS
  Filled 2022-01-31 (×2): qty 2

## 2022-01-31 MED ORDER — HYDRALAZINE HCL 50 MG PO TABS
100.0000 mg | ORAL_TABLET | Freq: Three times a day (TID) | ORAL | Status: DC
  Administered 2022-01-31 – 2022-02-01 (×4): 100 mg via ORAL
  Filled 2022-01-31 (×4): qty 2

## 2022-01-31 MED ORDER — METOCLOPRAMIDE HCL 10 MG PO TABS: 10.0000 mg | ORAL_TABLET | Freq: Three times a day (TID) | ORAL | Status: DC | PRN

## 2022-01-31 MED ORDER — CARVEDILOL 6.25 MG PO TABS
6.1250 mg | ORAL_TABLET | Freq: Two times a day (BID) | ORAL | Status: DC
  Administered 2022-01-31: 6.25 mg via ORAL
  Filled 2022-01-31: qty 1

## 2022-01-31 MED ORDER — ACETAMINOPHEN 650 MG RE SUPP: 650.0000 mg | Freq: Four times a day (QID) | RECTAL | Status: DC | PRN

## 2022-01-31 MED ORDER — INSULIN ASPART 100 UNIT/ML IJ SOLN
8.0000 [IU] | Freq: Once | INTRAMUSCULAR | Status: AC
Start: 2022-01-31 — End: 2022-01-31
  Administered 2022-01-31: 8 [IU] via SUBCUTANEOUS

## 2022-01-31 MED ORDER — PANTOPRAZOLE SODIUM 40 MG IV SOLR
40.0000 mg | INTRAVENOUS | Status: DC
  Administered 2022-01-31 – 2022-02-01 (×2): 40 mg via INTRAVENOUS
  Filled 2022-01-31 (×2): qty 10

## 2022-01-31 MED ORDER — DELFLEX-LC/4.25% DEXTROSE 483 MOSM/L IP SOLN
INTRAPERITONEAL | Status: DC
Start: 2022-01-31 — End: 2022-02-01

## 2022-01-31 MED ORDER — PROCHLORPERAZINE EDISYLATE 10 MG/2ML IJ SOLN
10.0000 mg | Freq: Four times a day (QID) | INTRAMUSCULAR | Status: DC | PRN
  Administered 2022-01-31 – 2022-02-01 (×2): 10 mg via INTRAVENOUS
  Filled 2022-01-31 (×3): qty 2

## 2022-01-31 MED ORDER — POLYETHYLENE GLYCOL 3350 17 G PO PACK: 17.0000 g | PACK | Freq: Every day | ORAL | Status: DC | PRN

## 2022-01-31 MED ORDER — ACETAMINOPHEN 325 MG PO TABS: 650.0000 mg | ORAL_TABLET | Freq: Four times a day (QID) | ORAL | Status: DC | PRN

## 2022-01-31 MED ORDER — METOCLOPRAMIDE HCL 5 MG/ML IJ SOLN
5.0000 mg | Freq: Once | INTRAMUSCULAR | Status: AC
  Administered 2022-01-31: 5 mg via INTRAVENOUS
  Filled 2022-01-31: qty 2

## 2022-01-31 MED ORDER — DELFLEX-LC/2.5% DEXTROSE 394 MOSM/L IP SOLN: INTRAPERITONEAL | Status: DC

## 2022-01-31 MED ORDER — FERROUS SULFATE 325 (65 FE) MG PO TABS
325.0000 mg | ORAL_TABLET | Freq: Three times a day (TID) | ORAL | Status: DC
  Administered 2022-01-31 – 2022-02-01 (×3): 325 mg via ORAL
  Filled 2022-01-31 (×3): qty 1

## 2022-01-31 MED ORDER — INSULIN ASPART 100 UNIT/ML IJ SOLN
0.0000 [IU] | INTRAMUSCULAR | Status: DC
  Administered 2022-01-31: 0.5 [IU] via SUBCUTANEOUS

## 2022-01-31 MED ORDER — INSULIN GLARGINE-YFGN 100 UNIT/ML ~~LOC~~ SOLN
8.0000 [IU] | Freq: Every day | SUBCUTANEOUS | Status: DC
  Filled 2022-01-31 (×2): qty 0.08

## 2022-01-31 NOTE — Assessment & Plan Note (Signed)
   Strict input and output monitoring  Daily weights  Remainder of assessment and plan as above

## 2022-01-31 NOTE — Assessment & Plan Note (Addendum)
   Patient exhibiting hypoxia as low as the 70s upon arrival to the emergency department  Patient denies associated shortness of breath but there is evidence of acute pulmonary edema on chest x-ray  Patient reports intermittently missing her peritoneal dialysis 4 times or more over the past 10 days so it is very possible that the patient is becoming volume overloaded with acute systolic congestive heart failure due to noncompliance with peritoneal dialysis  Supplemental oxygen for bouts of hypoxia  We will consult nephrology in the morning on day shift for resumption of peritoneal dialysis  Doubt any underlying infectious process

## 2022-01-31 NOTE — Progress Notes (Signed)
   01/31/22 0139  Assess: MEWS Score  Temp 97.7 F (36.5 C)  BP (!) 167/125  MAP (mmHg) 136  Pulse Rate (!) 118  ECG Heart Rate (!) 120  Resp 20  Level of Consciousness Alert  SpO2 98 %  O2 Device Nasal Cannula  O2 Flow Rate (L/min) 2 L/min  Assess: MEWS Score  MEWS Temp 0  MEWS Systolic 0  MEWS Pulse 2  MEWS RR 0  MEWS LOC 0  MEWS Score 2  MEWS Score Color Yellow  Assess: if the MEWS score is Yellow or Red  Were vital signs taken at a resting state? Yes  Focused Assessment No change from prior assessment  Does the patient meet 2 or more of the SIRS criteria? No  MEWS guidelines implemented *See Row Information* Yes  Treat  MEWS Interventions Escalated (See documentation below)  Pain Scale 0-10  Pain Score 4  Pain Type Acute pain  Pain Location Abdomen  Pain Orientation Right;Left;Mid  Pain Descriptors / Indicators Aching  Pain Frequency Intermittent  Pain Onset Gradual  Patients Stated Pain Goal 0  Pain Intervention(s) Repositioned;Emotional support  Complains of Nausea /  Vomiting  Interventions Relaxation;Reposition  Take Vital Signs  Increase Vital Sign Frequency  Yellow: Q 2hr X 2 then Q 4hr X 2, if remains yellow, continue Q 4hrs  Escalate  MEWS: Escalate Yellow: discuss with charge nurse/RN and consider discussing with provider and RRT  Notify: Charge Nurse/RN  Name of Charge Nurse/RN Notified Rondall Allegra, RN  Date Charge Nurse/RN Notified 01/31/22  Time Charge Nurse/RN Notified 0139  Notify: Provider  Provider Name/Title Dr. Cyd Silence  Date Provider Notified 01/31/22  Time Provider Notified 0200  Method of Notification  (Secure chat)  Notification Reason Other (Comment) (update)  Provider response En route  Date of Provider Response 01/31/22  Time of Provider Response 0200  Document  Patient Outcome Other (Comment) (on the unit)  Progress note created (see row info) Yes  Assess: SIRS CRITERIA  SIRS Temperature  0  SIRS Pulse 1  SIRS  Respirations  0  SIRS WBC 0  SIRS Score Sum  1

## 2022-01-31 NOTE — Progress Notes (Signed)
PROGRESS NOTE  Kathy Lewis  DOB: 1996/07/23  PCP: [facility], [facility] VZC:[id number]  DOA: 01/30/2022  LOS: 0 days  Hospital Day: 2  Brief narrative: Kathy Lewis is a 26 y.o. female with PMH significant for ESRD on nightly PD, DM1, chronic systolic CHF, migraine, severe anemia, bipolar disorder, anxiety/depression. Patient has been on peritoneal dialysis for last 8 months under the care of nephrologist Dr. Olivia Mackie at [facility].  She was recently hospitalized 5/30 to 5/31 for abdominal pain and gastroparesis symptoms. Patient presented to the ED on 6/5 with complaint of epigastric pain, nausea, vomiting.  Because of abdominal pain, she had to unfortunately skip her peritoneal dialysis treatments. In the ED, she was noted to be hypoxic and required 6 L oxygen by nasal cannula Chest x-ray showed acute pulm edema Patient was admitted to hospital service at [facility]. Nephrology consultation was obtained.  Subjective: Patient was seen and examined this morning.  Pleasant young African-American female.  Lying in bed.  Not in distress.  Her blood sugar level was low in 30s this morning.  Principal Problem:   Acute respiratory failure with hypoxia (HCC) Active Problems:   Intractable nausea and vomiting   Intractable hiccups   Prolonged QT interval   Acute on chronic systolic CHF (congestive heart failure) (HCC)   Acute pulmonary edema (HCC)   Uncontrolled type 1 diabetes mellitus with hyperglycemia, with long-term current use of insulin (HCC)   ESRD (end stage renal disease) ([place])   Essential hypertension   Mixed diabetic hyperlipidemia associated with type 1 diabetes mellitus (HCC)    Assessment and plan: Acute flareup of gastroparesis Intractable nausea and vomiting -Patient seems to be having intermittent/waxing and waning gastroparesis symptoms for last 2 weeks.  Briefly hospitalized 5/30-5/31. -Patient admitted again with similar symptoms. -Patient was  kept n.p.o.  Because of hypoglycemia symptoms, I would restart the patient on renal diet.  I put on Reglan as needed.  Continue to monitor symptoms and QTc. -Continue PPI, antiemetics  Intractable hiccups -Patient likely suffering from intractable hiccups due to significant uremia from noncompliance with peritoneal dialysis -Expect improvement with resumption of dialysis.   Recent Labs    08/12/21 0346 11/22/21 1027 11/23/21 0145 01/17/22 0514 01/21/22 1435 01/22/22 1647 01/24/22 0800 01/30/22 1415 01/30/22 1808 01/31/22 0615  BUN 65* 54* 52* 57* 57* 61* 62* 60* 61* 61*  CREATININE 6.95* 7.66* 7.87* 8.92* 8.43* 9.18* 9.63* 9.58* 9.21* 9.61*   Uncontrolled type 1 diabetes mellitus with hypo and hyperglycemia -A1c elevated at 9.2 -Was using insulin pump at home. -Blood sugar level this morning was as low as 34.  Patient had poor oral intake and had an insulin pump running.  I decided to stop insulin pump, start on diet with as needed Reglan. Insulin coverage provided with Semglee 8 units daily and sliding scale insulin with Accu-Cheks.  Continue to monitor Recent Labs  Lab 01/25/22 1225 01/31/22 0205 01/31/22 0936 01/31/22 1046 01/31/22 1152  GLUCAP 281* 112* 34* 68* 114*    Volume overload status  Missed dialysis ESRD-PD  -Because of abdominal pain, patient missed few sessions of peritoneal dialysis at home and started getting volume overloaded. On admission, she was hypoxic and chest x-ray with pulm edema. -Nephrology involved.  Acute on chronic systolic CHF Essential hypertension -Chronic systolic CHF with EF 12% and echocardiogram from March 2023.   -PTA, patient was on Coreg 6.25 mg twice daily, resume home antihypertensive therapy As needed intravenous labetalol for markedly elevated blood pressure  Prolonged QT interval -QTc prolonged 496.6 on admission EKG.  Continue monitor in telemetry. of prolonged QTc on serial EKGs limiting ability to treat patient's ongoing  suspected diabetic gastroparesis and nausea   HLD -Statin  Chronic anemia -Stable hemoglobin Recent Labs    11/23/21 0145 01/17/22 0514 01/22/22 1647 01/24/22 0800 01/30/22 1415 01/30/22 1808 01/31/22 0615  HGB 9.0*   < > 10.7* 10.1* 11.9* 12.2 10.3*  MCV 97.6   < > 100.0 97.2 97.9 95.1 96.4  FERRITIN 372*  --   --   --   --   --   --   TIBC 209*  --   --   --   --   --   --   IRON 30  --   --   --   --   --   --    < > = values in this interval not displayed.   Goals of care   Code Status: Full Code    Mobility: Encourage ambulation  Skin assessment:     Nutritional status:  Body mass index is 28.33 kg/m.          Diet:  Diet Order             Diet renal with fluid restriction Fluid restriction: 1200 mL Fluid; Room service appropriate? Yes; Fluid consistency: Thin  Diet effective now                   DVT prophylaxis:  heparin injection 5,000 Units Start: 01/31/22 0600   Antimicrobials: None Fluid: None Consultants: Nephrology Family Communication: None at bedside  Status is: Inpatient  Continue in-hospital care because: Continues to have recurrent gastroparesis symptoms Level of care: Telemetry Medical   Dispo: The patient is from: Home              Anticipated d/c is to: Home in next 1 to 2 days              Patient currently is not medically stable to d/c.   Difficult to place patient No     Infusions:   dialysis solution 2.5% low-MG/low-CA     dialysis solution 4.25% low-MG/low-CA      Scheduled Meds:  atorvastatin  80 mg Oral Daily   calcitRIOL  0.25 mcg Oral Daily   carvedilol  6.25 mg Oral BID WC   ferrous sulfate  325 mg Oral TID   fluticasone  1 spray Each Nare Daily   gentamicin cream  1 application. Topical Daily   heparin  5,000 Units Subcutaneous Q8H   hydrALAZINE  100 mg Oral TID   insulin aspart  0-5 Units Subcutaneous QHS   insulin aspart  0-9 Units Subcutaneous TID WC   insulin glargine-yfgn  8 Units  Subcutaneous Daily   isosorbide mononitrate  30 mg Oral Daily   pantoprazole (PROTONIX) IV  40 mg Intravenous Q24H   sacubitril-valsartan  1 tablet Oral BID    PRN meds: acetaminophen **OR** acetaminophen, butalbital-acetaminophen-caffeine, fentaNYL (SUBLIMAZE) injection, labetalol, metoCLOPramide, polyethylene glycol   Antimicrobials: Anti-infectives (From admission, onward)    None       Objective: Vitals:   01/31/22 1000 01/31/22 1200  BP: 129/89 118/88  Pulse: 98 96  Resp: 16 15  Temp:  98.1 F (36.7 C)  SpO2: 99% 99%    Intake/Output Summary (Last 24 hours) at 01/31/2022 1355 Last data filed at 01/31/2022 1200 Gross per 24 hour  Intake 600 ml  Output 250 ml  Net 350 ml   Filed Weights   01/30/22 1658 01/30/22 1706 01/31/22 0139  Weight: 59 kg 59 kg 65.8 kg   Weight change:  Body mass index is 28.33 kg/m.   Physical Exam: General exam: Pleasant, young African-American female Skin: No rashes, lesions or ulcers. HEENT: Atraumatic, normocephalic, no obvious bleeding Lungs: Clear to auscultation bilaterally CVS: Regular rate and rhythm, no murmur GI/Abd soft, nontender, nondistended, bowel sound present CNS: Alert, awake, oriented x3 Psychiatry: Mood appropriate Extremities: No pedal edema, no calf tenderness  Data Review: I have personally reviewed the laboratory data and studies available.  F/u labs ordered Unresulted Labs (From admission, onward)    None       Signed, Terrilee Croak, MD [facility] 01/31/2022

## 2022-01-31 NOTE — Progress Notes (Signed)
Pt stable connected as ordered no issues pt resting at this time

## 2022-01-31 NOTE — Assessment & Plan Note (Signed)
   Evidence of prolonged QTc on serial EKGs limiting ability to treat patient's ongoing suspected diabetic gastroparesis and nausea  QTc on arrival to the medical floor is just under 500, I have given a one-time dose of low-dose metoclopramide.  Frequent EKGs or telemetry monitoring to measure QTc can dictate what medications can be used to manage patient's symptoms  Monitoring electrolytes, correcting as necessary  Monitoring patient on telemetry

## 2022-01-31 NOTE — Consult Note (Signed)
Reason for Consult: Continuity of ESRD care Referring Physician: Terrilee Croak, MD [facility])  HPI:  26 year old woman with past medical history significant for insulin-dependent diabetes mellitus, chronic systolic congestive heart failure, history of bipolar disorder and end-stage renal disease on peritoneal dialysis for the past 10 months or so (under the care of Dr. Olivia Mackie, [facility]).  She was recently discharged from the hospital after brief hospitalization between 5/30 - 5/31 for abdominal pain and gastroparesis symptoms.  She presented to the emergency room again yesterday for recurrent abdominal pain that was primarily epigastric in location and waxing and waning during the course of the day.  She reports associated nausea and vomiting without any diarrhea or hematemesis.  She has unfortunately been skipping her peritoneal dialysis treatments due to abdominal pain.  She denies any fevers or chills and in the emergency room had some shortness of breath with some activity but without associated cough or sputum production.  She denies any urinary complaints including flank pain or hematuria.  She denies any orthostatic dizziness but reports to be generally feeling weak and tired.  She denies any blood in the peritoneal dialysis drainage fluid or any cloudiness.  Past Medical History:  Diagnosis Date   Anxiety    Bipolar 2 disorder (HCC)    Chronic systolic (congestive) heart failure (HCC)    Depression    DKA (diabetic ketoacidoses)    ESRD on peritoneal dialysis ([place])    HSV infection    on valtrex   Hypokalemia    Leukocytosis    Migraine    Noncompliance with medication regimen    Preeclampsia    Severe anemia    Type 1 diabetes mellitus ([place])     Past Surgical History:  Procedure Laterality Date   CARDIAC CATHETERIZATION     DILATION AND EVACUATION N/A 10/22/2019   Procedure: ULTRASOUND GUIDED DILATATION AND EVACUATION;  Surgeon: [name], MD;   Location: [facility] LD ORS;  Service: Gynecology;  Laterality: N/A;   NO PAST SURGERIES     peritoneal dialysis catheter insertion     RENAL BIOPSY      Family History  Adopted: Yes  Problem Relation Age of Onset   Heart disease Neg Hx     Social History:  reports that she has quit smoking. Her smoking use included cigars and cigarettes. She has a 2.00 pack-year smoking history. She has never used smokeless tobacco. She reports that she does not drink alcohol and does not use drugs.  Allergies:  Allergies  Allergen Reactions   Cantaloupe Extract Allergy Skin Test Itching    Mouth itching     Strawberry Extract Itching    Mouth itches   Citrullus Vulgaris Itching    Makes mouth itch , ALL melons    Nsaids Other (See Comments)    Avoid per nephrology    Medications: I have reviewed the patient's current medications. Scheduled:  atorvastatin  80 mg Oral Daily   calcitRIOL  0.25 mcg Oral Daily   carvedilol  6.25 mg Oral BID WC   fluticasone  1 spray Each Nare Daily   heparin  5,000 Units Subcutaneous Q8H   hydrALAZINE  100 mg Oral TID   insulin aspart  0-5 Units Subcutaneous QHS   insulin aspart  0-9 Units Subcutaneous TID WC   isosorbide mononitrate  30 mg Oral Daily   pantoprazole (PROTONIX) IV  40 mg Intravenous Q24H   sacubitril-valsartan  1 tablet Oral BID  Latest Ref Rng & Units 01/31/2022    6:15 AM 01/30/2022    6:08 PM 01/30/2022    2:15 PM  BMP  Glucose 70 - 99 mg/dL 63   174   180    BUN 6 - 20 mg/dL 61   61   60    Creatinine 0.44 - 1.00 mg/dL 9.61   9.21   9.58    Sodium 135 - 145 mmol/L 139   138   138    Potassium 3.5 - 5.1 mmol/L 4.2   4.5   4.5    Chloride 98 - 111 mmol/L 108   103   105    CO2 22 - 32 mmol/L 24   23   23     Calcium 8.9 - 10.3 mg/dL 8.4   8.4   8.1        Latest Ref Rng & Units 01/31/2022    6:15 AM 01/30/2022    6:08 PM 01/30/2022    2:15 PM  CBC  WBC 4.0 - 10.5 K/uL 5.7   6.5   6.3    Hemoglobin 12.0 - 15.0 g/dL 10.3   12.2   11.9     Hematocrit 36.0 - 46.0 % 31.7   36.6   36.9    Platelets 150 - 400 K/uL 229   256   241      DG Chest Portable 1 View  Result Date: 01/30/2022 CLINICAL DATA:  Chest pain EXAM: PORTABLE CHEST 1 VIEW COMPARISON:  01/24/2022 FINDINGS: Cardiomegaly with vascular congestion and probable mild interstitial edema. No pleural effusion or pneumothorax. IMPRESSION: Cardiomegaly with vascular congestion and mild interstitial edema Electronically Signed   By: Donavan Foil M.D.   On: 01/30/2022 19:35    Review of Systems  Constitutional:  Positive for appetite change and fatigue. Negative for chills and fever.  HENT:  Negative for nosebleeds, sneezing, sore throat and trouble swallowing.   Eyes:  Negative for photophobia and visual disturbance.  Respiratory:  Positive for shortness of breath. Negative for cough, choking and chest tightness.   Cardiovascular:  Negative for chest pain and leg swelling.  Gastrointestinal:  Positive for abdominal pain, nausea and vomiting. Negative for blood in stool and diarrhea.  Endocrine: Negative for polydipsia and polyuria.  Genitourinary:  Negative for dysuria, hematuria and urgency.  Musculoskeletal:  Positive for myalgias. Negative for arthralgias and back pain.  Skin:  Negative for pallor and wound.  Neurological:  Positive for weakness and headaches. Negative for dizziness and light-headedness.  Blood pressure 129/89, pulse 98, temperature 98 F (36.7 C), temperature source Axillary, resp. rate 16, height 5' (1.524 m), weight 65.8 kg, SpO2 99 %. Physical Exam Vitals and nursing note reviewed.  Constitutional:      Appearance: She is well-developed. She is obese. She is ill-appearing.  HENT:     Head: Normocephalic and atraumatic.     Mouth/Throat:     Pharynx: Oropharynx is clear.  Eyes:     Extraocular Movements: Extraocular movements intact.  Cardiovascular:     Rate and Rhythm: Normal rate and regular rhythm.     Heart sounds: Normal heart sounds. No  murmur heard. Pulmonary:     Effort: Pulmonary effort is normal.     Breath sounds: Normal breath sounds. No wheezing or rales.  Abdominal:     General: Bowel sounds are normal. There is distension.     Palpations: Abdomen is soft.     Tenderness: There is abdominal tenderness in  the epigastric area and left upper quadrant.     Hernia: No hernia is present.     Comments: Right lower quadrant PD catheter in place with intact dressing  Musculoskeletal:     Right lower leg: No edema.     Left lower leg: No edema.  Skin:    General: Skin is warm and dry.  Neurological:     General: No focal deficit present.     Mental Status: She is oriented to person, place, and time.    Assessment/Plan: 1.  Intractable nausea/vomiting and abdominal pain: Suspect that this might be indeed associated with recurrent gastroparesis, ongoing evaluation and symptom management per primary service. 2.  Acute hypoxic respiratory failure: With earlier documented evidence of oxygen desaturation while in the emergency room especially with activity.  Chest x-ray revealing pulmonary edema that we will attempt to manage with peritoneal dialysis/ultrafiltration. 3.  End-stage renal disease: Will restart peritoneal dialysis with efforts at volume unloading using alternating 4% and 2.5% PD solution. 4.  Uncontrolled type 1 diabetes mellitus: Management per primary service 5.  Anemia of chronic disease: Hemoglobin and hematocrit currently at goal, no indication for ESA.  Ryley Bachtel K. 01/31/2022, 11:10 AM

## 2022-01-31 NOTE — Assessment & Plan Note (Addendum)
   Patient presenting with 4-day history of ongoing severe epigastric pain and frequent bouts of nausea and vomiting.  Nausea and vomiting always occurs after eating, sometimes up to several hours later, sometimes composed of un digested food  In this patient with longstanding diabetes mellitus type 1 with chronically poor control, it is very likely that this patient is developing diabetic gastroparesis  Borderline prolonged QT is limiting ability to manage this patient with metoclopramide  N.p.o. for now, slow advancement of diet as patient clinically improves.  Best bet of treatment will be tight control of patient's diabetes  Sparing use of QT prolonging prokinetic agents such as metoclopramide, QTc is currently under 500 and therefore we will give a small dose of 5 mg now  We will additionally treat recurrent bouts of nausea with low doses of benzodiazepine as well as concurrent intravenous PPI for severe intractable hiccups  Patient would benefit from gastric emptying study as soon as able

## 2022-01-31 NOTE — TOC Initial Note (Signed)
Transition of Care [facility]) - Initial/Assessment Note    Patient Details  Name: Kathy Lewis MRN: [id number] Date of Birth: 1996/04/19  Transition of Care [facility]) CM/SW Contact:    Angelita Ingles, RN Phone Number:[phone]  01/31/2022, 11:19 AM  Clinical Narrative:                 TOC following 26 year old female with past medical history of end-stage renal disease on daily peritoneal dialysis, systolic congestive heart failure associated with postpartum cardiomyopathy, diabetes mellitus type 1, hypertension, hyperlipidemia presenting  with complaints of intractable nausea and vomiting, epigastric abdominal pain and acute hypoxic respiratory failure. Patient is high risk for readmission. Patient states that she is from home where she lives in an apartment with her daughter. Per patient she manages independently at home. Patient reports no DME or home health services. Patient states that MD is at [facility] family medicine patient is unable to recall the actual name of physician although she is active. Pharmacy of choice is Paediatric nurse on [address]. Patient reports that medications are affordable and that patient has a car and is able to drive herself to appointments. Transition of Care Department [facility]) has reviewed patient and no TOC needs have been identified at this time. We will continue to monitor patient advancement through interdisciplinary progression rounds.   Expected Discharge Plan: Home/Self Care Barriers to Discharge: Continued Medical Work up   Patient Goals and CMS Choice Patient states their goals for this hospitalization and ongoing recovery are:: Wants to get better to go home CMS Medicare.gov Compare Post Acute Care list provided to::  (n/a) Choice offered to / list presented to : NA  Expected Discharge Plan and Services Expected Discharge Plan: Home/Self Care In-house Referral: NA Discharge Planning Services: CM Consult Post Acute Care Choice: NA Living  arrangements for the past 2 months: Apartment                 DME Arranged: N/A DME Agency: NA       HH Arranged: NA [place] Agency: NA        Prior Living Arrangements/Services Living arrangements for the past 2 months: Apartment Lives with:: Minor Children Patient language and need for interpreter reviewed:: Yes Do you feel safe going back to the place where you live?: Yes      Need for Family Participation in Patient Care: No (Comment) Care giver support system in place?: Yes (comment) Current home services:  (n/a) Criminal Activity/Legal Involvement Pertinent to Current Situation/Hospitalization: No - Comment as needed  Activities of Daily Living      Permission Sought/Granted Permission sought to share information with : Family Supports Permission granted to share information with : No              Emotional Assessment Appearance:: Appears stated age Attitude/Demeanor/Rapport: Other (comment), Gracious (drifts off to sleep during coversation but easily aroused) Affect (typically observed): Quiet, Pleasant Orientation: : Oriented to Self, Oriented to Place, Oriented to  Time, Oriented to Situation Alcohol / Substance Use: Not Applicable Psych Involvement: No (comment)  Admission diagnosis:  Acute pulmonary edema (HCC) [J81.0] Prolonged Q-T interval on ECG [R94.31] Acute respiratory failure with hypoxia (HCC) [J96.01] Intractable nausea and vomiting [R11.2] Patient Active Problem List   Diagnosis Date Noted   Acute pulmonary edema ([place]) 01/31/2022   Mixed diabetic hyperlipidemia associated with type 1 diabetes mellitus ([place]) 01/31/2022   Intractable hiccups 01/31/2022   Abdominal pain of unknown cause 01/24/2022   Uncontrolled type 1  diabetes mellitus with hyperglycemia, with long-term current use of insulin ([place]) 01/24/2022   ESRD (end stage renal disease) ([place]) 11/23/2021   Abnormal CT of the chest 11/23/2021   Acute respiratory failure with hypoxia ([place])  11/22/2021   Gastroparesis 11/22/2021   Pulmonary embolism ([place]) 11/22/2021   Acute on chronic systolic CHF (congestive heart failure) ([place]) 08/11/2021   Influenza A 08/11/2021   Hyperkalemia 08/11/2021   Bipolar 2 disorder ([place])    Essential hypertension    Chronic systolic CHF (congestive heart failure) ([place]) 07/11/2020   Hypoalbuminemia    Anxiety    Normocytic anemia    Prolonged QT interval    Dyslipidemia 05/18/2020   Preeclampsia 10/15/2019   Severe anemia 10/13/2019   Pre-existing type 1 diabetes mellitus with hyperglycemia during pregnancy in first trimester ([place]) 04/30/2019   Hypokalemia    DKA (diabetic ketoacidoses) 12/18/2014   Leukocytosis 12/18/2014   Noncompliance with medications 12/18/2014   Intractable nausea and vomiting 12/18/2014   PCP:  Medicine, [place]:   [place], [address] [place] [address] Phone: [phone] Fax: [place] [address] [address] Phone: [phone] Fax: [phone]     Social Determinants of Health (SDOH) Interventions    Readmission Risk Interventions    01/31/2022   11:13 AM 08/12/2021    1:11 PM  Readmission Risk Prevention Plan  Transportation Screening Complete Complete  Medication Review (RN Care Manager) Referral to Pharmacy Complete  PCP or Specialist appointment within 3-5 days of discharge Complete Complete  HRI or [place] Complete Complete  SW Recovery Care/Counseling Consult Complete Complete  Palliative Care Screening Not Applicable Not [place] Not Applicable Not Applicable

## 2022-01-31 NOTE — Assessment & Plan Note (Signed)
   Patient likely suffering from intractable hiccups due to significant uremia from noncompliance with peritoneal dialysis  Alternatively it could be secondary to reflux from patient's frequent vomiting  Initiating a trial of intravenous Protonix which can be switched to oral Protonix once patient is tolerating oral intake.  Patient should certainly be discharged on a regimen of scheduled Protonix therapy especially considering frequent nausea and vomiting from suspected gastroparesis  We will initiate dialysis soon as possible to help improve uremia  If patient continues to experience intractable hiccups will consider initiation of baclofen

## 2022-01-31 NOTE — Assessment & Plan Note (Signed)
·   Please see assessment and plan above °

## 2022-01-31 NOTE — Assessment & Plan Note (Addendum)
.   Patient is currently on insulin pump therapy at 0.5 units/h . Patient would benefit from titration of this insulin pump therapy to achieve improved glycemic control prior to discharge.  Alternatively patient should simply be switched to a basal bolus insulin regimen of insulin. . We will check patient's blood sugars every 4 hours while n.p.o. and provide additional sliding scale as necessary . Hemoglobin A1C ordered . We will advance to diabetic diet once patient is able to tolerate oral intake.  Diabetic Diet

## 2022-01-31 NOTE — Assessment & Plan Note (Signed)
   We will consult nephrology on day shift for resumption of peritoneal dialysis while here  Suspect some degree of cardiogenic volume overload due to noncompliance with dialysis in the past 1-1/2 to 2 weeks

## 2022-01-31 NOTE — Assessment & Plan Note (Signed)
.   Continuing home regimen of lipid lowering therapy.  

## 2022-01-31 NOTE — Progress Notes (Signed)
Inpatient Diabetes Program Recommendations  AACE/ADA: New Consensus Statement on Inpatient Glycemic Control (2015)  Target Ranges:  Prepandial:   less than 140 mg/dL      Peak postprandial:   less than 180 mg/dL (1-2 hours)      Critically ill patients:  140 - 180 mg/dL   Lab Results  Component Value Date   GLUCAP 114 (H) 01/31/2022   HGBA1C 9.2 (H) 11/23/2021    Review of Glycemic Control  Latest Reference Range & Units 01/31/22 09:36 01/31/22 10:46 01/31/22 11:52  Glucose-Capillary 70 - 99 mg/dL 34 (LL) 68 (L) 114 (H)  (LL): Data is critically low (L): Data is abnormally low (H): Data is abnormally high  Diabetes history: DM1(does not make insulin.  Needs correction, basal and meal coverage)  Outpatient Diabetes medications:  OmniPod using Humalog Basal- 0.5 units/hr--12 units in 24 hrs. ICR-1 units: 8 CHOs Target 130 mg/dL ENDO-Dr. Chalmers Cater  Current orders for Inpatient glycemic control: Novolog 0-9 units TID and 0-5 units QHS  Inpatient Diabetes Program Recommendations:    Switched from insulin pump therapy to SQ insulin.  Please consider:  1-Semglee 8 units QD (has type 1 diabetes and requires basal insulin) 2-Once eating please add Novolog 3 units TID with meals if eats at least 50%  [name]  She has been admitted with N/V for 2 weeks.  She states she has never experienced this in the past.  She is current with Dr. Chalmers Cater and sees her every 3-4 months.  She started the insulin pump 2 months ago.  Her A1C was 9.2%.  She has noticed a big difference in her glucose since starting her pump.  She has occasional lows at home and treats appropriately.  She is currently wearing her Dexcom.  She will have someone bring a new pump site, supplies and insulin prior to DC.    Will continue to follow while inpatient.  Thank you, Kathy Dixon, MSN, [facility] Diabetes Coordinator Inpatient Diabetes Program [phone] (team pager from 8a-5p)

## 2022-01-31 NOTE — ED Notes (Signed)
[facility] at bedside 

## 2022-01-31 NOTE — H&P (Addendum)
History and Physical    Patient: Kathy Lewis MRN: [id number] [place]: 01/30/2022  Date of Service: the patient was seen and examined on 01/31/2022  Patient coming from: Home via [place]  Chief Complaint:  Chief Complaint  Patient presents with   Emesis   Diarrhea   Abdominal Pain    HPI:   26 year old female with past medical history of end-stage renal disease on daily peritoneal dialysis, systolic congestive heart failure associated with postpartum cardiomyopathy, diabetes mellitus type 1, hypertension, hyperlipidemia presenting to [facility] as a transfer from [place] due to complaints of intractable nausea and vomiting, epigastric abdominal pain and acute hypoxic respiratory failure.  Of note, patient has been experiencing epigastric pain nausea and vomiting intermittently for the past 2 weeks.  This resulted in emergency department presentations on 5/23, 5/27, 5/28.  CT imaging of the abdomen pelvis was performed during one of the emergency department presentations on 5/27 that was unremarkable.   Patient eventually required hospitalization at [facility] from 5/30 until 5/31.  Symptoms spontaneously improved with patient able to tolerate oral intake and patient was eventually discharged.  Documented concern for gastroparesis during that hospital stay.  Patient explains that approximately 2 days after discharge she once again began to experience abdominal pain.  Patient describes abdominal pain as epigastric in location, moderate to severe in intensity, sharp in quality.  Pain waxes and wanes throughout the day.  Patient also experiences frequent bouts of nausea and vomiting.  Typically episodes of nausea and vomiting occur several hours after eating a meal regardless of what she eats.  Patient denies regular marijuana use but did try to use marijuana once last Friday which she states improved her symptoms.  This is her first time using marijuana.  Patient  reports that because she has not felt well she has been frequently missing her peritoneal dialysis.  As patient's symptoms have worsened over the past several days patient is additionally experiencing longstanding bouts of hiccups.  Due to patient's persisting symptoms the patient eventually presented to [facility] emergency department for evaluation due to a prolonged wait she left without being seen and instead went to [place] for evaluation.    Upon evaluation at [facility] emergency department patient was administered as needed intravenous Ativan for her nausea considering her history of prolonged QT.  During her evaluation patient was found to be hypoxic requiring up to 6 L of oxygen via nasal cannula as well as evidence of acute pulmonary edema on chest x-ray.  Due to continued intractable nausea and vomiting as well as acute hypoxic respiratory failure the hospitalist group was called and patient was excepted for transfer to [facility].  Review of Systems: Review of Systems  Gastrointestinal:  Positive for abdominal pain, nausea and vomiting.  All other systems reviewed and are negative.   Past Medical History:  Diagnosis Date   Anxiety    Bipolar 2 disorder ([place])    Chronic systolic (congestive) heart failure (HCC)    Depression    DKA (diabetic ketoacidoses)    ESRD on peritoneal dialysis ([place])    HSV infection    on valtrex   Hypokalemia    Leukocytosis    Migraine    Noncompliance with medication regimen    Preeclampsia    Severe anemia    Type 1 diabetes mellitus ([place])     Past Surgical History:  Procedure Laterality Date   CARDIAC CATHETERIZATION  DILATION AND EVACUATION N/A 10/22/2019   Procedure: ULTRASOUND GUIDED DILATATION AND EVACUATION;  Surgeon: [name], MD;  Location: [facility] LD ORS;  Service: Gynecology;  Laterality: N/A;   NO PAST SURGERIES     peritoneal dialysis catheter insertion     RENAL BIOPSY       Social History:  reports that she has quit smoking. Her smoking use included cigars and cigarettes. She has a 2.00 pack-year smoking history. She has never used smokeless tobacco. She reports that she does not drink alcohol and does not use drugs.  Allergies  Allergen Reactions   Cantaloupe Extract Allergy Skin Test Itching    Mouth itching     Strawberry Extract Itching    Mouth itches   Citrullus Vulgaris Itching    Makes mouth itch , ALL melons    Nsaids Other (See Comments)    Avoid per nephrology    Family History  Adopted: Yes  Problem Relation Age of Onset   Heart disease Neg Hx     Prior to Admission medications   Medication Sig Start Date End Date Taking? Authorizing Provider  atorvastatin (LIPITOR) 40 MG tablet Take 80 mg by mouth daily. 08/03/21   [provider]  azelastine (ASTELIN) 0.1 % nasal spray Place 2 sprays into both nostrils 2 (two) times daily. Use in each nostril as directed Patient taking differently: Place 2 sprays into both nostrils 2 (two) times daily as needed for allergies. Use in each nostril as directed 12/14/21   Hunsucker, Bonna Gains, MD  butalbital-acetaminophen-caffeine [facility]) [phone] MG tablet Take 1 tablet by mouth daily as needed for headache. 02/18/21 02/18/22  Dwyane Dee, MD  calcitRIOL (ROCALTROL) 0.25 MCG capsule Take 0.25 mcg by mouth daily. 08/03/21   [provider]  carvedilol (COREG) 12.5 MG tablet Take 6.125 mg by mouth 2 (two) times daily with a meal.    [provider]  ENTRESTO 49-51 MG Take 1 tablet by mouth 2 (two) times daily. 12/23/21   [provider]  FEROSUL 325 (65 Fe) MG tablet Take 325 mg by mouth 3 (three) times daily. 04/30/21   [provider]  fluticasone (FLONASE) 50 MCG/ACT nasal spray Place 1 spray into both nostrils in the morning and at bedtime. 12/14/21   Hunsucker, Bonna Gains, MD  hydrALAZINE (APRESOLINE) 100 MG tablet Take 100 mg by mouth 3 (three) times daily.  08/05/21   [provider]  insulin lispro (HUMALOG) 100 UNIT/ML injection 0-10 Units as directed. Sliding Scale 1 unit per 8 carbs, 1/2 unit per hour 01/08/22   [provider]  Insulin Syringe-Needle U-100 (INSULIN SYRINGE .3CC/31GX5/16") 31G X 5/16" 0.3 ML MISC Use as directed 10/27/19   [name], MD  isosorbide mononitrate (IMDUR) 30 MG 24 hr tablet Take 30 mg by mouth daily.    [provider]  magnesium oxide (MAG-OX) 400 MG tablet Take 800 mg by mouth daily. 08/03/21   [provider]  metoCLOPramide (REGLAN) 10 MG tablet Take 1 tablet (10 mg total) by mouth every 8 (eight) hours as needed for nausea. 01/17/22   Lucrezia Starch, MD  ondansetron (ZOFRAN) 4 MG tablet Take 1 tablet (4 mg total) by mouth every 12 (twelve) hours as needed for nausea or vomiting. 01/25/22   Little Ishikawa, MD  prochlorperazine (COMPAZINE) 5 MG tablet Take 1 tablet (5 mg total) by mouth every 6 (six) hours as needed for nausea or vomiting. 01/21/22   Regan Lemming, MD  Vitamin D, Ergocalciferol, (  DRISDOL) 1.25 MG (50000 UNIT) CAPS capsule Take 50,000 Units by mouth every Sunday.    [provider]  escitalopram (LEXAPRO) 10 MG tablet Take 20 mg by mouth daily.  05/10/20 10/06/20  [provider]  furosemide (LASIX) 40 MG tablet Take 1 tablet (40 mg total) by mouth daily. Patient not taking: No sig reported 10/28/19 02/17/21  [name], MD  lisinopril (ZESTRIL) 10 MG tablet Take 10 mg by mouth daily.  02/23/20 10/06/20  [provider]  spironolactone (ALDACTONE) 25 MG tablet Take 25 mg by mouth daily.  04/20/20 10/06/20  [provider]    Physical Exam:  Vitals:   01/30/22 2145 01/31/22 0045 01/31/22 0139 01/31/22 0329  BP: (!) 156/111 (!) 167/130 (!) 167/125 (!) 160/121  Pulse: (!) 111 (!) 120 (!) 118 (!) 122  Resp: 19 16 20 20   Temp:   97.7 F (36.5 C) 97.8 F (36.6 C)  TempSrc:   Axillary Axillary  SpO2: 98% 96% 98% 98%   Weight:   65.8 kg   Height:   5' (1.524 m)     Constitutional: Awake alert and oriented x3, patient is in distress due to abdominal pain Skin: no rashes, no lesions, good skin turgor noted. Eyes: Pupils are equally reactive to light.  No evidence of scleral icterus or conjunctival pallor.  ENMT: Moist mucous membranes noted.  Posterior pharynx clear of any exudate or lesions.   Neck: normal, supple, no masses, no thyromegaly.  No evidence of jugular venous distension.   Respiratory: Notable bibasilar rales without wheezing.  Normal respiratory effort. No accessory muscle use.  Cardiovascular: Tachycardic rate with regular rhythm, no murmurs / rubs / gallops. No extremity edema. 2+ pedal pulses. No carotid bruits.  Chest:   Nontender without crepitus or deformity.   Back:   Nontender without crepitus or deformity. Abdomen: Notable epigastric tenderness.  Abdomen is soft however..  No evidence of intra-abdominal masses.  Positive bowel sounds noted in all quadrants.   Musculoskeletal: No joint deformity upper and lower extremities. Good ROM, no contractures. Normal muscle tone.  Neurologic: CN 2-12 grossly intact. Sensation intact.  Patient moving all 4 extremities spontaneously.  Patient is following all commands.  Patient is responsive to verbal stimuli.   Psychiatric: Patient exhibits normal mood with appropriate affect.  Patient seems to possess insight as to their current situation.    Data Reviewed:  I have personally reviewed and interpreted labs, imaging.  Significant findings are:  Lipase 24 COVID-19 PCR testing negative. CBC revealing white blood cell count of 6.5 hemoglobin of 12.2, hematocrit of 36.6 and platelet count of 256. Chemistry revealing sodium 138 potassium 4.5 glucose 180 BUN 60 and creatinine 9.58.   EKG: Personally reviewed.  Rhythm is sinus tachycardia with heart rate of 118 bpm.  No dynamic ST segment changes appreciated.   Assessment and Plan: * Acute  respiratory failure with hypoxia (HCC) Patient exhibiting hypoxia as low as the 70s upon arrival to the emergency department Patient denies associated shortness of breath but there is evidence of acute pulmonary edema on chest x-ray Patient reports intermittently missing her peritoneal dialysis 4 times or more over the past 10 days so it is very possible that the patient is becoming volume overloaded with acute systolic congestive heart failure due to noncompliance with peritoneal dialysis Supplemental oxygen for bouts of hypoxia We will consult nephrology in the morning on day shift for resumption of peritoneal dialysis Doubt any underlying infectious process  Intractable nausea and  vomiting Patient presenting with 4-day history of ongoing severe epigastric pain and frequent bouts of nausea and vomiting. Nausea and vomiting always occurs after eating, sometimes up to several hours later, sometimes composed of un digested food In this patient with longstanding diabetes mellitus type 1 with chronically poor control, it is very likely that this patient is developing diabetic gastroparesis Borderline prolonged QT is limiting ability to manage this patient with metoclopramide N.p.o. for now, slow advancement of diet as patient clinically improves. Best bet of treatment will be tight control of patient's diabetes Sparing use of QT prolonging prokinetic agents such as metoclopramide, QTc is currently under 500 and therefore we will give a small dose of 5 mg now We will additionally treat recurrent bouts of nausea with low doses of benzodiazepine as well as concurrent intravenous PPI for severe intractable hiccups Patient would benefit from gastric emptying study as soon as able  Intractable hiccups Patient likely suffering from intractable hiccups due to significant uremia from noncompliance with peritoneal dialysis Alternatively it could be secondary to reflux from patient's frequent  vomiting Initiating a trial of intravenous Protonix which can be switched to oral Protonix once patient is tolerating oral intake.  Patient should certainly be discharged on a regimen of scheduled Protonix therapy especially considering frequent nausea and vomiting from suspected gastroparesis We will initiate dialysis soon as possible to help improve uremia If patient continues to experience intractable hiccups will consider initiation of baclofen  Prolonged QT interval Evidence of prolonged QTc on serial EKGs limiting ability to treat patient's ongoing suspected diabetic gastroparesis and nausea QTc on arrival to the medical floor is just under 500, I have given a one-time dose of low-dose metoclopramide. Frequent EKGs or telemetry monitoring to measure QTc can dictate what medications can be used to manage patient's symptoms Monitoring electrolytes, correcting as necessary Monitoring patient on telemetry  Acute pulmonary edema (HCC) Please see assessment and plan above  Acute on chronic systolic CHF (congestive heart failure) (HCC) Strict input and output monitoring Daily weights Remainder of assessment and plan as above  Uncontrolled type 1 diabetes mellitus with hyperglycemia, with long-term current use of insulin ([place]) Patient is currently on insulin pump therapy at 0.5 units/h Patient would benefit from titration of this insulin pump therapy to achieve improved glycemic control prior to discharge.  Alternatively patient should simply be switched to a basal bolus insulin regimen of insulin. We will check patient's blood sugars every 4 hours while n.p.o. and provide additional sliding scale as necessary Hemoglobin A1C ordered We will advance to diabetic diet once patient is able to tolerate oral intake.  Diabetic Diet   ESRD (end stage renal disease) ([place]) We will consult nephrology on day shift for resumption of peritoneal dialysis while here Suspect some degree of cardiogenic  volume overload due to noncompliance with dialysis in the past 1-1/2 to 2 weeks  Essential hypertension Resume home antihypertensive therapy As needed intravenous labetalol for markedly elevated blood pressure  Mixed diabetic hyperlipidemia associated with type 1 diabetes mellitus ([place]) Continuing home regimen of lipid lowering therapy.        Code Status:  Full code  code status decision has been confirmed with: patient Family Communication: deferred   Consults: We will consult nephrology on day shift for resumption of dialysis.  Severity of Illness:  The appropriate patient status for this patient is INPATIENT. Inpatient status is judged to be reasonable and necessary in order to provide the required intensity of service to ensure the  patient's safety. The patient's presenting symptoms, physical exam findings, and initial radiographic and laboratory data in the context of their chronic comorbidities is felt to place them at high risk for further clinical deterioration. Furthermore, it is not anticipated that the patient will be medically stable for discharge from the hospital within 2 midnights of admission.   * I certify that at the point of admission it is my clinical judgment that the patient will require inpatient hospital care spanning beyond 2 midnights from the point of admission due to high intensity of service, high risk for further deterioration and high frequency of surveillance required.*  Author:  Vernelle Emerald MD  01/31/2022 5:56 AM

## 2022-01-31 NOTE — Assessment & Plan Note (Signed)
   Resume home antihypertensive therapy  As needed intravenous labetalol for markedly elevated blood pressure

## 2022-02-01 ENCOUNTER — Inpatient Hospital Stay (HOSPITAL_COMMUNITY): Payer: Medicaid Other

## 2022-02-01 LAB — GLUCOSE, CAPILLARY
Glucose-Capillary: 388 mg/dL — ABNORMAL HIGH (ref 70–99)
Glucose-Capillary: 287 mg/dL — ABNORMAL HIGH (ref 70–99)
Glucose-Capillary: 120 mg/dL — ABNORMAL HIGH (ref 70–99)

## 2022-02-01 MED ORDER — INSULIN ASPART 100 UNIT/ML IJ SOLN
5.0000 [IU] | Freq: Once | INTRAMUSCULAR | Status: AC
Start: 2022-02-01 — End: 2022-02-01
  Administered 2022-02-01: 5 [IU] via SUBCUTANEOUS

## 2022-02-01 MED ORDER — ONDANSETRON HCL 4 MG PO TABS: 4.0000 mg | ORAL_TABLET | Freq: Four times a day (QID) | ORAL | 0 refills | Status: AC | PRN

## 2022-02-01 MED ORDER — TECHNETIUM TC 99M SULFUR COLLOID
2.0000 | Freq: Once | INTRAVENOUS | Status: AC | PRN
  Administered 2022-02-01: 2 via ORAL

## 2022-02-01 MED ORDER — INSULIN GLARGINE-YFGN 100 UNIT/ML ~~LOC~~ SOLN
20.0000 [IU] | Freq: Every day | SUBCUTANEOUS | Status: DC
  Administered 2022-02-01: 20 [IU] via SUBCUTANEOUS
  Filled 2022-02-01: qty 0.2

## 2022-02-01 MED ORDER — PANTOPRAZOLE SODIUM 40 MG PO TBEC: 40.0000 mg | DELAYED_RELEASE_TABLET | Freq: Every day | ORAL | Status: DC

## 2022-02-01 NOTE — Progress Notes (Signed)
Inpatient Diabetes Program Recommendations  AACE/ADA: New Consensus Statement on Inpatient Glycemic Control (2015)  Target Ranges:  Prepandial:   less than 140 mg/dL      Peak postprandial:   less than 180 mg/dL (1-2 hours)      Critically ill patients:  140 - 180 mg/dL   Lab Results  Component Value Date   GLUCAP 120 (H) 02/01/2022   HGBA1C 9.2 (H) 11/23/2021    [name]  She is discharging today.  She is aware to place pump back on tomorrow around noon as she has had SQ basal insulin today.  She has back up rapid acting insulin pens at home for correction and meal coverage until then.    Will continue to follow while inpatient.  Thank you, Reche Dixon, MSN, [place] Diabetes Coordinator Inpatient Diabetes Program [phone] (team pager from 8a-5p)

## 2022-02-01 NOTE — Progress Notes (Addendum)
Inpatient Diabetes Program Recommendations  AACE/ADA: New Consensus Statement on Inpatient Glycemic Control (2015)  Target Ranges:  Prepandial:   less than 140 mg/dL      Peak postprandial:   less than 180 mg/dL (1-2 hours)      Critically ill patients:  140 - 180 mg/dL   Lab Results  Component Value Date   GLUCAP 287 (H) 02/01/2022   HGBA1C 9.2 (H) 11/23/2021    Review of Glycemic Control  Latest Reference Range & Units 01/31/22 11:52 01/31/22 17:12 01/31/22 21:06 02/01/22 01:15 02/01/22 06:05  Glucose-Capillary 70 - 99 mg/dL 114 (H) 279 (H) 455 (H) 388 (H) 287 (H)  (H): Data is abnormally high  Novolog correction was held at 17:00 last night with cbg of 279 mg/dL because she was not eating and had a hypoglycemic episode earlier in the day yesterday.  It also appears basal insulin was not administered yesterday.   Her cbg went up to 455 mg/dL at 21:06 last evening and is 287 mg/dL this morning.  Inpatient Diabetes recommendations:  Please administer correction as ordered.  Patient requires basal insulin as she does not make insulin.  Addendum@14 :37:  Ordered Semglee 20 units; administered at 13:29.  Follow CBGs closely as this may be to large of a dose for her considering her home basal is 12 units/24H.  If becomes hypoglycemic, please reduce to 8-10 units QD.   Will continue to follow while inpatient.  Thank you, Reche Dixon, MSN, [place] Diabetes Coordinator Inpatient Diabetes Program [phone] (team pager from 8a-5p)

## 2022-02-01 NOTE — Progress Notes (Signed)
Discharged home accompanied by sister. Discharge instructions given. Belongings taken home.

## 2022-02-01 NOTE — Progress Notes (Signed)
Am dose  of meds. On hold for the procedure as per instructions of nuclear med.

## 2022-02-01 NOTE — Progress Notes (Signed)
Peritoneal dialysis completed at around 6 am, vitals taken and patient weighed. Dialysis nurse to come and disconnect the patient.

## 2022-02-01 NOTE — Discharge Summary (Signed)
Physician Discharge Summary  Kathy [facility] WUX:[id number] DOB: [birth year] DOA: 01/30/2022  PCP: [facility], [facility] date: 01/30/2022 Discharge date: 02/01/2022  Admitted From: Home Discharge disposition: Home  Recommendations at discharge:  Please remain on soft diet for next few days and gradually advance to regular consistency. Zofran as needed for nausea/vomiting.  Brief narrative: Kathy Lewis is a 26 y.o. female with PMH significant for ESRD on nightly PD, DM1, chronic systolic CHF, migraine, severe anemia, bipolar disorder, anxiety/depression. Patient has been on peritoneal dialysis for last 8 months under the care of nephrologist Dr. Olivia Mackie at [facility].  She was recently hospitalized 5/30 to 5/31 for abdominal pain and gastroparesis symptoms. Patient presented to the ED on 6/5 with complaint of epigastric pain, nausea, vomiting.  Because of abdominal pain, she had to unfortunately skip her peritoneal dialysis treatments. In the ED, she was noted to be hypoxic and required 6 L oxygen by nasal cannula Chest x-ray showed acute pulm edema Patient was [facility] to [facility] at [facility]. Nephrology consultation was obtained.  Subjective: Patient was seen and examined this afternoon.  Feels better.  Able to tolerate egg for gastric emptying test earlier.  Gastric emptying test resulted normal. Patient is currently clear liquid diet only.  I offered her overnight stay to gradually advance diet.  Patient wants to go home this afternoon.  Principal Problem:   Acute respiratory failure with hypoxia (HCC) Active Problems:   Intractable nausea and vomiting   Intractable hiccups   Prolonged QT interval   Acute on chronic systolic CHF (congestive heart failure) (HCC)   Acute pulmonary edema (HCC)   Uncontrolled type 1 diabetes mellitus with hyperglycemia, with long-term current use of insulin (HCC)   ESRD (end stage renal disease) ([place])   Essential  hypertension   Mixed diabetic hyperlipidemia associated with type 1 diabetes mellitus ([place])    Assessment and plan: Intractable nausea and vomiting -Presented with recurrent episodes of nausea, vomiting not responding to outpatient antiemetics.   -She was suspected of having gastroparesis but gastric emptying test resulted normal today. -Patient wants to be discharged. -As needed Zofran given.  Volume overload status  Missed dialysis ESRD-PD  -Because of abdominal pain, patient missed few sessions of peritoneal dialysis at home and started getting volume overloaded. On admission, she was hypoxic and chest x-ray with pulm edema. -Nephrology involved. -Dialysis was resumed.  Respiratory status improved.  Not on oxygen supplementation at this time. -Patient to follow-up with [facility] nephrologist at [facility].  Intractable hiccups -Patient likely suffering from intractable hiccups due to significant uremia from noncompliance with peritoneal dialysis -Symptoms improved after dialysis was resumed. Recent Labs    08/12/21 0346 11/22/21 1027 11/23/21 0145 01/17/22 0514 01/21/22 1435 01/22/22 1647 01/24/22 0800 01/30/22 1415 01/30/22 1808 01/31/22 0615  BUN 65* 54* 52* 57* 57* 61* 62* 60* 61* 61*  CREATININE 6.95* 7.66* 7.87* 8.92* 8.43* 9.18* 9.63* 9.58* 9.21* 9.61*   Uncontrolled type 1 diabetes mellitus with hypo and hyperglycemia -A1c elevated at 9.2 -Was using insulin pump at home.  Because of inadequate oral intake, insulin pump was discontinued.  She was placed on basal and bolus insulin regimen. -After discharge, patient will reattach her insulin pump. Recent Labs  Lab 01/31/22 1712 01/31/22 2106 02/01/22 0115 02/01/22 0605 02/01/22 1315  GLUCAP 279* 455* 388* 287* 120*    Acute on chronic systolic CHF Essential hypertension -Chronic systolic CHF with EF 25% and echocardiogram from March 2023.   -Continue  Coreg, Entresto, Imdur, hydralazine as before.  Prolonged QT  interval -QTc prolonged 496 milliseconds on admission EKG.   -Electrolytes were monitored  Recent Labs  Lab 01/30/22 1415 01/30/22 1808 01/31/22 0615  K 4.5 4.5 4.2  MG  --   --  2.2    HLD -Statin  Chronic anemia -Stable hemoglobin Recent Labs    11/23/21 0145 01/17/22 0514 01/22/22 1647 01/24/22 0800 01/30/22 1415 01/30/22 1808 01/31/22 0615  HGB 9.0*   < > 10.7* 10.1* 11.9* 12.2 10.3*  MCV 97.6   < > 100.0 97.2 97.9 95.1 96.4  FERRITIN 372*  --   --   --   --   --   --   TIBC 209*  --   --   --   --   --   --   IRON 30  --   --   --   --   --   --    < > = values in this interval not displayed.   Goals of care   Code Status: Full Code    Mobility: Encourage ambulation  Skin assessment:     Nutritional status:  Body mass index is 26.95 kg/m.           Wounds:  -    Discharge Exam:   Vitals:   02/01/22 0257 02/01/22 0607 02/01/22 0618 02/01/22 0744  BP: (!) 159/115  (!) 134/94 (!) 136/106  Pulse: (!) 113  (!) 117 (!) 112  Resp: 14  18 17   Temp: 98.9 F (37.2 C)  99 F (37.2 C) 98.8 F (37.1 C)  TempSrc: Oral  Oral Oral  SpO2: 99%  93% 95%  Weight:  62.6 kg    Height:        Body mass index is 26.95 kg/m.  General exam: Pleasant, young African-American female Skin: No rashes, lesions or ulcers. HEENT: Atraumatic, normocephalic, no obvious bleeding Lungs: Clear to auscultation bilaterally CVS: Regular rate and rhythm, no murmur GI/Abd soft, nontender, nondistended, bowel sound present CNS: Alert, awake, oriented x3 Psychiatry: Mood appropriate Extremities: No pedal edema, no calf tenderness  Follow ups:    Follow-up Information     [facility].   Specialty: Family Medicine                Discharge Instructions:   Discharge Instructions     Call MD for:  difficulty breathing, headache or visual disturbances   Complete by: As directed    Call MD for:  extreme fatigue    Complete by: As directed    Call MD for:  hives   Complete by: As directed    Call MD for:  persistant dizziness or light-headedness   Complete by: As directed    Call MD for:  persistant nausea and vomiting   Complete by: As directed    Call MD for:  severe uncontrolled pain   Complete by: As directed    Call MD for:  temperature >100.4   Complete by: As directed    Diet general   Complete by: As directed    Renal/diabetic diet   Discharge instructions   Complete by: As directed    Recommendations at discharge:   Please remain on soft diet for next few days and gradually advance to regular consistency.  Zofran as needed for nausea/vomiting.  Discharge instructions for diabetes mellitus: Check blood sugar 3 times a day and bedtime at home. If blood sugar  running above 200 or less than 70 please call your MD to adjust insulin. If you notice signs and symptoms of hypoglycemia (low blood sugar) like jitteriness, confusion, thirst, tremor and sweating, please check blood sugar, drink sugary drink/biscuits/sweets to increase sugar level and call MD or return to ER.    General discharge instructions: Follow with Primary MD [facility] in 7 days  Please request your PCP  to go over your [facility] tests, procedures, radiology results at the follow up. Please get your medicines reviewed and adjusted.  Your PCP may decide to repeat certain labs or tests as needed. Do not drive, operate heavy machinery, perform activities at heights, swimming or participation in water activities or provide baby sitting services if your were admitted for syncope or siezures until you have seen by Primary MD or a Neurologist and advised to do so again. [place] Controlled Substance Reporting System database was reviewed. Do not drive, operate heavy machinery, perform activities at heights, swim, participate in water activities or provide baby-sitting services while on medications  for pain, sleep and mood until your outpatient physician has reevaluated you and advised to do so again.  You are strongly recommended to comply with the dose, frequency and duration of prescribed medications. Activity: As tolerated with Full fall precautions use walker/cane & assistance as needed Avoid using any recreational substances like cigarette, tobacco, alcohol, or non-prescribed drug. If you experience worsening of your admission symptoms, develop shortness of breath, life threatening emergency, suicidal or homicidal thoughts you must seek medical attention immediately by calling 911 or calling your MD immediately  if symptoms less severe. You must read complete instructions/literature along with all the possible adverse reactions/side effects for all the medicines you take and that have been prescribed to you. Take any new medicine only after you have completely understood and accepted all the possible adverse reactions/side effects.  Wear Seat belts while driving. You were cared for by a hospitalist during your [facility] stay. If you have any questions about your discharge medications or the care you received while you were in the [facility] after you are discharged, you can call the unit and ask to speak with the hospitalist or the covering physician. Once you are discharged, your primary care physician will handle any further medical issues. Please note that NO REFILLS for any discharge medications will be authorized once you are discharged, as it is imperative that you return to your primary care physician (or establish a relationship with a primary care physician if you do not have one).   Discharge wound care:   Complete by: As directed    Increase activity slowly   Complete by: As directed        Discharge Medications:   Allergies as of 02/01/2022       Reactions   Cantaloupe Extract Allergy Skin Test Itching   Mouth itching   Strawberry Extract Itching   Mouth itches   Citrullus  Vulgaris Itching   Makes mouth itch , ALL melons   Nsaids Other (See Comments)   Avoid per nephrology        Medication List     TAKE these medications    atorvastatin 40 MG tablet Commonly known as: LIPITOR Take 80 mg by mouth daily.   azelastine 0.1 % nasal spray Commonly known as: ASTELIN Place 2 sprays into both nostrils 2 (two) times daily. Use in each nostril as directed What changed:  when to take this reasons to take  this   butalbital-acetaminophen-caffeine 50-325-40 MG tablet Commonly known as: FIORICET Take 1 tablet by mouth daily as needed for headache.   calcitRIOL 0.25 MCG capsule Commonly known as: ROCALTROL Take 0.25 mcg by mouth daily.   carvedilol 12.5 MG tablet Commonly known as: COREG Take 6.25 mg by mouth 2 (two) times daily with a meal.   Entresto 49-51 MG Generic drug: sacubitril-valsartan Take 1 tablet by mouth 2 (two) times daily.   FeroSul 325 (65 FE) MG tablet Generic drug: ferrous sulfate Take 325 mg by mouth 3 (three) times daily.   fluticasone 50 MCG/ACT nasal spray Commonly known as: FLONASE Place 1 spray into both nostrils in the morning and at bedtime. What changed:  when to take this reasons to take this   hydrALAZINE 100 MG tablet Commonly known as: APRESOLINE Take 100 mg by mouth 3 (three) times daily.   insulin lispro 100 UNIT/ML injection Commonly known as: HUMALOG 0-10 Units as directed. Sliding Scale 1 unit per 8 carbs, 1/2 unit per hour   INSULIN SYRINGE .3CC/31GX5/16" 31G X 5/16" 0.3 ML Misc Use as directed   isosorbide mononitrate 30 MG 24 hr tablet Commonly known as: IMDUR Take 30 mg by mouth daily.   magnesium oxide 400 MG tablet Commonly known as: MAG-OX Take 800 mg by mouth daily.   metoCLOPramide 10 MG tablet Commonly known as: REGLAN Take 1 tablet (10 mg total) by mouth every 8 (eight) hours as needed for nausea.   ondansetron 4 MG tablet Commonly known as: Zofran Take 1 tablet (4 mg total) by  mouth every 6 (six) hours as needed for up to 10 days for nausea, vomiting or refractory nausea / vomiting. What changed:  when to take this reasons to take this   prochlorperazine 5 MG tablet Commonly known as: COMPAZINE Take 1 tablet (5 mg total) by mouth every 6 (six) hours as needed for nausea or vomiting.   Vitamin D (Ergocalciferol) 1.25 MG (50000 UNIT) Caps capsule Commonly known as: DRISDOL Take 50,000 Units by mouth every Sunday.               Discharge Care Instructions  (From admission, onward)           Start     Ordered   02/01/22 0000  Discharge wound care:        06 /07/23 1436             The results of significant diagnostics from this hospitalization (including imaging, microbiology, ancillary and laboratory) are listed below for reference.    Procedures and Diagnostic Studies:   DG Chest Portable 1 View  Result Date: 01/30/2022 CLINICAL DATA:  Chest pain EXAM: PORTABLE CHEST 1 VIEW COMPARISON:  01/24/2022 FINDINGS: Cardiomegaly with vascular congestion and probable mild interstitial edema. No pleural effusion or pneumothorax. IMPRESSION: Cardiomegaly with vascular congestion and mild interstitial edema Electronically Signed   By: Donavan Foil M.D.   On: 01/30/2022 19:35     Labs:   Basic Metabolic Panel: Recent Labs  Lab 01/30/22 1415 01/30/22 1808 01/31/22 0615  NA 138 138 139  K 4.5 4.5 4.2  CL 105 103 108  CO2 23 23 24   GLUCOSE 180* 174* 63*  BUN 60* 61* 61*  CREATININE 9.58* 9.21* 9.61*  CALCIUM 8.1* 8.4* 8.4*  MG  --   --  2.2   GFR Estimated Creatinine Clearance: 7.3 mL/min (A) (by C-G formula based on SCr of 9.61 mg/dL (H)). Liver Function Tests: Recent Labs  Lab  01/30/22 1415 01/30/22 1808 01/31/22 0615  AST 17 18 17   ALT 14 13 11   ALKPHOS 58 67 56  BILITOT 0.8 0.7 0.7  PROT 5.6* 6.9 5.2*  ALBUMIN 2.7* 3.1* 2.4*   Recent Labs  Lab 01/30/22 1415  LIPASE 24   No results for input(s): AMMONIA in the last 168  hours. Coagulation profile No results for input(s): INR, PROTIME in the last 168 hours.  CBC: Recent Labs  Lab 01/30/22 1415 01/30/22 1808 01/31/22 0615  WBC 6.3 6.5 5.7  NEUTROABS  --   --  5.1  HGB 11.9* 12.2 10.3*  HCT 36.9 36.6 31.7*  MCV 97.9 95.1 96.4  PLT 241 256 229   Cardiac Enzymes: No results for input(s): CKTOTAL, CKMB, CKMBINDEX, TROPONINI in the last 168 hours. BNP: Invalid input(s): POCBNP CBG: Recent Labs  Lab 01/31/22 1712 01/31/22 2106 02/01/22 0115 02/01/22 0605 02/01/22 1315  GLUCAP 279* 455* 388* 287* 120*   D-Dimer No results for input(s): DDIMER in the last 72 hours. Hgb A1c No results for input(s): HGBA1C in the last 72 hours. Lipid Profile No results for input(s): CHOL, HDL, LDLCALC, TRIG, CHOLHDL, LDLDIRECT in the last 72 hours. Thyroid function studies No results for input(s): TSH, T4TOTAL, T3FREE, THYROIDAB in the last 72 hours.  Invalid input(s): FREET3 Anemia work up No results for input(s): VITAMINB12, FOLATE, FERRITIN, TIBC, IRON, RETICCTPCT in the last 72 hours. Microbiology Recent Results (from the past 240 hour(s))  Culture, body fluid w Gram Stain-bottle     Status: None   Collection Time: 01/24/22 11:08 AM   Specimen: Peritoneal Washings  Result Value Ref Range Status   Specimen Description PERITONEAL  Final   Special Requests NONE  Final   Culture   Final    NO GROWTH 5 DAYS Performed at [facility], [address], [place], [place] [address]    Report Status 01/29/2022 FINAL  Final  Gram stain     Status: None   Collection Time: 01/24/22 11:08 AM   Specimen: Peritoneal Washings  Result Value Ref Range Status   Specimen Description PERITONEAL  Final   Special Requests NONE  Final   Gram Stain   Final    NO WBC SEEN NO ORGANISMS SEEN Performed at [facility], [place] [address], [place], [place] [address]    Report Status 01/24/2022 FINAL  Final  Gastrointestinal Panel by PCR , Stool     Status: None    Collection Time: 01/24/22  8:01 PM   Specimen: STOOL  Result Value Ref Range Status   Campylobacter species NOT DETECTED NOT DETECTED Final   Plesimonas shigelloides NOT DETECTED NOT DETECTED Final   Salmonella species NOT DETECTED NOT DETECTED Final   Yersinia enterocolitica NOT DETECTED NOT DETECTED Final   Vibrio species NOT DETECTED NOT DETECTED Final   Vibrio cholerae NOT DETECTED NOT DETECTED Final   Enteroaggregative E coli (EAEC) NOT DETECTED NOT DETECTED Final   Enteropathogenic E coli (EPEC) NOT DETECTED NOT DETECTED Final   Enterotoxigenic E coli (ETEC) NOT DETECTED NOT DETECTED Final   Shiga like toxin producing E coli (STEC) NOT DETECTED NOT DETECTED Final   Shigella/Enteroinvasive E coli (EIEC) NOT DETECTED NOT DETECTED Final   Cryptosporidium NOT DETECTED NOT DETECTED Final   Cyclospora cayetanensis NOT DETECTED NOT DETECTED Final   Entamoeba histolytica NOT DETECTED NOT DETECTED Final   Giardia lamblia NOT DETECTED NOT DETECTED Final   Adenovirus F40/41 NOT DETECTED NOT DETECTED Final   Astrovirus  NOT DETECTED NOT DETECTED Final   Norovirus GI/GII NOT DETECTED NOT DETECTED Final   Rotavirus A NOT DETECTED NOT DETECTED Final   Sapovirus (I, II, IV, and V) NOT DETECTED NOT DETECTED Final    Comment: Performed at [facility], [place], [place] [address]  C Difficile Quick Screen w PCR reflex     Status: None   Collection Time: 01/24/22  8:01 PM   Specimen: STOOL  Result Value Ref Range Status   C Diff antigen NEGATIVE NEGATIVE Final   C Diff toxin NEGATIVE NEGATIVE Final   C Diff interpretation No C. difficile detected.  Final    Comment: Performed at [facility], [place] [address], [place], [place] [address]  SARS Coronavirus 2 by RT PCR ([facility] order, performed in [facility]) *cepheid single result test* Anterior Nasal Swab     Status: None   Collection Time: 01/30/22  7:54 PM   Specimen: Anterior Nasal Swab  Result Value  Ref Range Status   SARS Coronavirus 2 by RT PCR NEGATIVE NEGATIVE Final    Comment: (NOTE) SARS-CoV-2 target nucleic acids are NOT DETECTED.  The SARS-CoV-2 RNA is generally detectable in upper and lower respiratory specimens during the acute phase of infection. The lowest concentration of SARS-CoV-2 viral copies this assay can detect is 250 copies / mL. A negative result does not preclude SARS-CoV-2 infection and should not be used as the sole basis for treatment or other patient management decisions.  A negative result may occur with improper specimen collection / handling, submission of specimen other than nasopharyngeal swab, presence of viral mutation(s) within the areas targeted by this assay, and inadequate number of viral copies (<250 copies / mL). A negative result must be combined with clinical observations, patient history, and epidemiological information.  Fact Sheet for Patients:   https://www.patel.info/  Fact Sheet for Healthcare Providers: https://hall.com/  This test is not yet approved or  cleared by the [place] FDA and has been authorized for detection and/or diagnosis of SARS-CoV-2 by FDA under an Emergency Use Authorization (EUA).  This EUA will remain in effect (meaning this test can be used) for the duration of the COVID-19 declaration under Section 564(b)(1) of the Act, 21 U.S.C. section 360bbb-3(b)(1), unless the authorization is terminated or revoked sooner.  Performed at [facility], [address] [place], [place] [address]   MRSA Next Gen by PCR, Nasal     Status: None   Collection Time: 01/31/22  1:50 AM   Specimen: Nasal Mucosa; Nasal Swab  Result Value Ref Range Status   MRSA by PCR Next Gen NOT DETECTED NOT DETECTED Final    Comment: (NOTE) The GeneXpert MRSA Assay (FDA approved for NASAL specimens only), is one component of a comprehensive MRSA colonization surveillance program. It  is not intended to diagnose MRSA infection nor to guide or monitor treatment for MRSA infections. Test performance is not FDA approved in patients less than 52 years old. Performed at [facility], [place] [address], [place], [place] [address]     Time coordinating discharge: 35 minutes  Signed: Meredyth Hornung  [facility] 02/01/2022, 2:36 PM

## 2022-02-01 NOTE — Progress Notes (Signed)
   02/01/22 0745  Peritoneal Catheter Right lower abdomen  No placement date or time found.   Catheter Location: Right lower abdomen  Site Assessment Clean, Dry, Intact  Drainage Description None  Catheter status Deaccessed  Dressing Gauze/Drain sponge  Dressing Status Clean, Dry, Intact  Completion  Treatment Status Complete  Initial Drain Volume 376  Average Dwell Time-Min(s) 90  Average Drain Time 45  Total Therapy Time-Hour(s) 9  Total Therapy Time-Min(s) 36  Effluent Appearance Amber;Clear  Fluid Balance - CCPD  Net UF (mL) 1450 ml  Procedure Comments  Tolerated treatment well? Yes  Peritoneal Dialysis Comments tolerated well no s/s of distress to note,  Education / Care Plan  Dialysis Education Provided Yes  Hand-Off documentation  Handoff Given Given to shift RN/LPN  Report given to (Full Name) Collene Mares RN  Handoff Received Received from shift RN/LPN  Report received from (Full Name) Sherren Mocha RN   Tolerated well throughout treatment no s/s of distress to note

## 2022-02-01 NOTE — Progress Notes (Signed)
Patient ID: [facility], female   DOB: Oct 08, 1995, 26 y.o.   MRN: [id number] [facility] Progress Note   Assessment/ Plan:   1.  Intractable nausea/vomiting and abdominal pain: Likely from gastroparesis with recurrence and challenges with treatment due to QT prolongation.   2.  Acute hypoxic respiratory failure: Transient hypoxemia while in the emergency room noted with chest x-ray suggestive of early developing pulmonary edema, remains on supplemental oxygen. 3.  End-stage renal disease: With 1.5 L ultrafiltration overnight, will repeat peritoneal dialysis at current prescription for efforts at additional ultrafiltration again tonight. 4.  Uncontrolled type 1 diabetes mellitus: Management per primary service 5.  Anemia of chronic disease: Hemoglobin and hematocrit currently at goal, no indication for ESA.  Subjective:   Reports some improvement of nausea/abdominal pain overnight.  Denies any shortness of breath.   Objective:   BP (!) 136/106 (BP Location: Left Wrist)   Pulse (!) 112   Temp 98.8 F (37.1 C) (Oral)   Resp 17   Ht 5' (1.524 m)   Wt 62.6 kg   SpO2 95%   BMI 26.95 kg/m   Physical Exam: Gen: Appears comfortably resting in bed, awakens to calling her name and engaging in conversation CVS: Pulse regular tachycardia, S1 and S2 normal Resp: Fine rales left base otherwise clear to auscultation Abd: Soft, mild epigastric tenderness, bowel sounds normal Ext: No lower extremity edema  Labs: BMET Recent Labs  Lab 01/30/22 1415 01/30/22 1808 01/31/22 0615  NA 138 138 139  K 4.5 4.5 4.2  CL 105 103 108  CO2 23 23 24   GLUCOSE 180* 174* 63*  BUN 60* 61* 61*  CREATININE 9.58* 9.21* 9.61*  CALCIUM 8.1* 8.4* 8.4*   CBC Recent Labs  Lab 01/30/22 1415 01/30/22 1808 01/31/22 0615  WBC 6.3 6.5 5.7  NEUTROABS  --   --  5.1  HGB 11.9* 12.2 10.3*  HCT 36.9 36.6 31.7*  MCV 97.9 95.1 96.4  PLT 241 256 229      Medications:     atorvastatin  80 mg  Oral Daily   calcitRIOL  0.25 mcg Oral Daily   carvedilol  6.25 mg Oral BID WC   ferrous sulfate  325 mg Oral TID   fluticasone  1 spray Each Nare Daily   gentamicin cream  1 application. Topical Daily   heparin  5,000 Units Subcutaneous Q8H   hydrALAZINE  100 mg Oral TID   insulin aspart  0-5 Units Subcutaneous QHS   insulin aspart  0-9 Units Subcutaneous TID WC   insulin glargine-yfgn  20 Units Subcutaneous Daily   isosorbide mononitrate  30 mg Oral Daily   pantoprazole (PROTONIX) IV  40 mg Intravenous Q24H   sacubitril-valsartan  1 tablet Oral BID   Elmarie Shiley, MD 02/01/2022, 8:04 AM

## 2022-02-01 NOTE — Progress Notes (Signed)
Transported to nuclear med. By chair awake and alert.

## 2022-02-01 NOTE — Progress Notes (Signed)
Back from nuclear med. Awake and alert.

## 2022-02-04 NOTE — Progress Notes (Signed)
@Patient  ID: Kathy Lewis, female    DOB: 08-Dec-1995, 26 y.o.   MRN: [id number]  Chief Complaint  Patient presents with   Consult    Consult for persistant left lung infiltrate. Pt states she does have a cough. She states cold air makes it worse. Pt states this has been occurring since November 2022. She states she had the flu and PNA before all this started.     Referring provider: Medicine, [facility]*  HPI:   26 y.o. woman with history of type 1 diabetes and ESRD on PD presents for follow-up of lung infiltrate on the left.  Chief complaint is cough.  Discharge summary reviewed.  Patient was admitted to hospital recently.  Chest x-ray on arrival to ED on my interpretation reveals retrocardiac opacity with air bronchograms.  CT scan obtained next day demonstrates small infiltrate with air bronchograms in the left lower lobe adjacent to the heart.  In addition, scattered atelectasis on my interpretation.  During admission some antibiotics were given.  Her presentation was thought to be most consistent with volume overload.  With dyspnea, hypoxemia.  Chest x-ray on day of discharge with similar.  Retrocardiac opacity on my interpretation.  Since discharge she is doing fine.  No issues with dyspnea.  Better.  Adherent to the PD.  Her chief complaint is cough.  With sinus congestion, nasal congestion.  Not much rhinorrhea mainly postnasal drip.  Coughs up some congestion.  Worse in the mornings.  Present since November.  At that time she had flu.  No evidence to make it better or worse.  No seasonal environmental factors she can identify to make things better or worse.  No other alleviating or exacerbating factors.  PMH: ESRD on PD, hyperlipidemia, type 1 diabetes, seasonal allergies Surgical history: Peritoneal dialysis catheter, dilation and evacuation Family history: Denies history of respiratory pressure relative Social history: Minimal smoking history, 2-pack-year, lives in  [place] / Pulmonary Flowsheets:   ACT:      No data to display          MMRC:     No data to display          Epworth:      No data to display          Tests:   FENO:  No results found for: "NITRICOXIDE"  PFT:     No data to display          WALK:      No data to display          Imaging: Personally reviewed and as per EMR  Lab Results: Personally reviewed CBC    Component Value Date/Time   WBC 5.7 01/31/2022 0615   RBC 3.29 (L) 01/31/2022 0615   HGB 10.3 (L) 01/31/2022 0615   HCT 31.7 (L) 01/31/2022 0615   HCT 23.0 (L) 10/18/2019 0534   PLT 229 01/31/2022 0615   MCV 96.4 01/31/2022 0615   MCH 31.3 01/31/2022 0615   MCHC 32.5 01/31/2022 0615   RDW 13.7 01/31/2022 0615   LYMPHSABS 0.4 (L) 01/31/2022 0615   MONOABS 0.3 01/31/2022 0615   EOSABS 0.0 01/31/2022 0615   BASOSABS 0.0 01/31/2022 0615    BMET    Component Value Date/Time   NA 139 01/31/2022 0615   K 4.2 01/31/2022 0615   CL 108 01/31/2022 0615   CO2 24 01/31/2022 0615   GLUCOSE 63 (L) 01/31/2022 0615   BUN 61 (H) 01/31/2022 0615  CREATININE 9.61 (H) 01/31/2022 0615   CALCIUM 8.4 (L) 01/31/2022 0615   GFRNONAA 5 (L) 01/31/2022 0615   GFRAA 52 (L) 10/28/2019 1933    BNP    Component Value Date/Time   BNP 2,727.2 (H) 11/22/2021 1027    ProBNP No results found for: "PROBNP"  Specialty Problems       Pulmonary Problems   Influenza A   Acute respiratory failure with hypoxia (HCC)   Acute pulmonary edema (HCC)   Intractable hiccups    Allergies  Allergen Reactions   Cantaloupe Extract Allergy Skin Test Itching    Mouth itching     Strawberry Extract Itching    Mouth itches   Citrullus Vulgaris Itching    Makes mouth itch , ALL melons    Nsaids Other (See Comments)    Avoid per nephrology    There is no immunization history for the selected administration types on file for this patient.  Past Medical History:  Diagnosis  Date   Anxiety    Bipolar 2 disorder (HCC)    Chronic systolic (congestive) heart failure (HCC)    Depression    DKA (diabetic ketoacidoses)    ESRD on peritoneal dialysis ([place])    HSV infection    on valtrex   Hypokalemia    Leukocytosis    Migraine    Noncompliance with medication regimen    Preeclampsia    Severe anemia    Type 1 diabetes mellitus (HCC)     Tobacco History: Social History   Tobacco Use  Smoking Status Former   Packs/day: 1.00   Years: 2.00   Total pack years: 2.00   Types: Cigars, Cigarettes  Smokeless Tobacco Never   Counseling given: Not Answered   Continue to not smoke  Outpatient Encounter Medications as of 12/14/2021  Medication Sig   atorvastatin (LIPITOR) 40 MG tablet Take 80 mg by mouth daily.   azelastine (ASTELIN) 0.1 % nasal spray Place 2 sprays into both nostrils 2 (two) times daily. Use in each nostril as directed (Patient taking differently: Place 2 sprays into both nostrils 2 (two) times daily as needed for allergies. Use in each nostril as directed)   butalbital-acetaminophen-caffeine (FIORICET) 50-325-40 MG tablet Take 1 tablet by mouth daily as needed for headache.   calcitRIOL (ROCALTROL) 0.25 MCG capsule Take 0.25 mcg by mouth daily.   carvedilol (COREG) 12.5 MG tablet Take 6.25 mg by mouth 2 (two) times daily with a meal.   FEROSUL 325 (65 Fe) MG tablet Take 325 mg by mouth 3 (three) times daily.   fluticasone (FLONASE) 50 MCG/ACT nasal spray Place 1 spray into both nostrils in the morning and at bedtime. (Patient taking differently: Place 1 spray into both nostrils 2 (two) times daily as needed for allergies.)   hydrALAZINE (APRESOLINE) 100 MG tablet Take 100 mg by mouth 3 (three) times daily.   Insulin Syringe-Needle U-100 (INSULIN SYRINGE .3CC/31GX5/16") 31G X 5/16" 0.3 ML MISC Use as directed   isosorbide mononitrate (IMDUR) 30 MG 24 hr tablet Take 30 mg by mouth daily.   magnesium oxide (MAG-OX) 400 MG tablet Take 800 mg by  mouth daily.   Vitamin D, Ergocalciferol, (DRISDOL) 1.25 MG (50000 UNIT) CAPS capsule Take 50,000 Units by mouth every Sunday.   [DISCONTINUED] gentamicin cream (GARAMYCIN) 0.1 % Apply 1 application topically daily. Apply to exit site. (Patient not taking: Reported on 01/24/2022)   [DISCONTINUED] medroxyPROGESTERone (DEPO-PROVERA) 150 MG/ML injection Inject 1 mL (150 mg total) into the muscle every  3 (three) months. (Patient not taking: Reported on 01/24/2022)   [DISCONTINUED] methocarbamol (ROBAXIN) 500 MG tablet Take 500 mg by mouth 4 (four) times daily as needed for muscle spasms. (Patient not taking: Reported on 01/22/2022)   [DISCONTINUED] prochlorperazine (COMPAZINE) 5 MG tablet Take 1 tablet (5 mg total) by mouth every 6 (six) hours as needed for nausea or vomiting.   [DISCONTINUED] sacubitril-valsartan (ENTRESTO) 24-26 MG Take 1 tablet by mouth 2 (two) times daily. (Patient not taking: Reported on 01/22/2022)   [DISCONTINUED] escitalopram (LEXAPRO) 10 MG tablet Take 20 mg by mouth daily.    [DISCONTINUED] furosemide (LASIX) 40 MG tablet Take 1 tablet (40 mg total) by mouth daily. (Patient not taking: No sig reported)   [DISCONTINUED] lisinopril (ZESTRIL) 10 MG tablet Take 10 mg by mouth daily.    [DISCONTINUED] spironolactone (ALDACTONE) 25 MG tablet Take 25 mg by mouth daily.    No facility-administered encounter medications on file as of 12/14/2021.     Review of Systems  Review of Systems  No chest pain with exertion.  No orthopnea or PND.  No worsening lower extremity swelling.  Comprehensive review of systems otherwise negative. Physical Exam  BP 120/72 (BP Location: Left Arm, Patient Position: Sitting, Cuff Size: Normal)   Pulse 88   Temp 98.3 F (36.8 C) (Oral)   Ht 5' (1.524 m)   Wt 139 lb 9.6 oz (63.3 kg)   SpO2 99%   BMI 27.26 kg/m   Wt Readings from Last 5 Encounters:  02/01/22 138 lb 0.1 oz (62.6 kg)  01/25/22 141 lb 4.8 oz (64.1 kg)  01/21/22 145 lb 4.5 oz (65.9 kg)   01/17/22 140 lb (63.5 kg)  12/14/21 139 lb 9.6 oz (63.3 kg)    BMI Readings from Last 5 Encounters:  02/01/22 26.95 kg/m  01/25/22 27.60 kg/m  01/21/22 28.37 kg/m  01/17/22 27.34 kg/m  12/14/21 27.26 kg/m     Physical Exam General: Well-appearing, in no acute distress Eyes: EOMI, no icterus Neck: Supple, no JVP Pulmonary: Clear to auscultation bilaterally, normal work of breathing Cardiovascular: Regular rate and rhythm, no murmur Abdomen: Nondistended, bowel sounds present MSK: No synovitis, no joint effusion Neuro: Normal gait, no weakness Psych: Normal mood, full affect  Assessment & Plan:   Postnasal drip with cough: Caseation of congestion, postnasal drip.  Instructed to use Flonase twice daily and azelastine twice daily.  If cough not improved, consider reactive airway disease, ICS/LABA therapy.  Lung infiltrate: Unclear etiology.  Retrocardiac opacity with small infiltrate that correlates on CT scan.  Was admitted with volume overload.  Repeat chest x-ray today.  If persistent may need repeat CT scan.   Return in about 2 months (around 02/13/2022).   Lanier Clam, MD 02/04/2022

## 2022-02-26 ENCOUNTER — Observation Stay (HOSPITAL_COMMUNITY): Payer: Medicaid Other

## 2022-02-26 ENCOUNTER — Observation Stay (HOSPITAL_COMMUNITY)
Admission: EM | Admit: 2022-02-26 | Discharge: 2022-02-26 | Discharge status: Against Medical Advice | Payer: Medicaid Other | Attending: Internal Medicine | Admitting: Internal Medicine

## 2022-02-26 ENCOUNTER — Other Ambulatory Visit: Payer: Self-pay

## 2022-02-26 ENCOUNTER — Encounter (HOSPITAL_COMMUNITY): Payer: Self-pay | Admitting: *Deleted

## 2022-02-26 DIAGNOSIS — I5022 Chronic systolic (congestive) heart failure: Secondary | ICD-10-CM | POA: Insufficient documentation

## 2022-02-26 DIAGNOSIS — Z87891 Personal history of nicotine dependence: Secondary | ICD-10-CM | POA: Insufficient documentation

## 2022-02-26 DIAGNOSIS — E1065 Type 1 diabetes mellitus with hyperglycemia: Secondary | ICD-10-CM | POA: Diagnosis present

## 2022-02-26 DIAGNOSIS — R9431 Abnormal electrocardiogram [ECG] [EKG]: Secondary | ICD-10-CM | POA: Insufficient documentation

## 2022-02-26 DIAGNOSIS — Z992 Dependence on renal dialysis: Secondary | ICD-10-CM | POA: Insufficient documentation

## 2022-02-26 DIAGNOSIS — E875 Hyperkalemia: Secondary | ICD-10-CM | POA: Diagnosis not present

## 2022-02-26 DIAGNOSIS — E1022 Type 1 diabetes mellitus with diabetic chronic kidney disease: Secondary | ICD-10-CM | POA: Diagnosis not present

## 2022-02-26 DIAGNOSIS — N186 End stage renal disease: Secondary | ICD-10-CM | POA: Diagnosis not present

## 2022-02-26 DIAGNOSIS — E1110 Type 2 diabetes mellitus with ketoacidosis without coma: Secondary | ICD-10-CM | POA: Diagnosis present

## 2022-02-26 DIAGNOSIS — Z79899 Other long term (current) drug therapy: Secondary | ICD-10-CM | POA: Insufficient documentation

## 2022-02-26 DIAGNOSIS — Z794 Long term (current) use of insulin: Secondary | ICD-10-CM | POA: Insufficient documentation

## 2022-02-26 DIAGNOSIS — R112 Nausea with vomiting, unspecified: Secondary | ICD-10-CM | POA: Diagnosis not present

## 2022-02-26 DIAGNOSIS — E1010 Type 1 diabetes mellitus with ketoacidosis without coma: Secondary | ICD-10-CM | POA: Diagnosis not present

## 2022-02-26 LAB — BLOOD GAS, VENOUS
Acid-base deficit: 9.2 mmol/L — ABNORMAL HIGH (ref 0.0–2.0)
Bicarbonate: 17.5 mmol/L — ABNORMAL LOW (ref 20.0–28.0)
O2 Saturation: 72.5 %
Patient temperature: 37
pCO2, Ven: 40 mmHg — ABNORMAL LOW (ref 44–60)
pH, Ven: 7.25 (ref 7.25–7.43)
pO2, Ven: 47 mmHg — ABNORMAL HIGH (ref 32–45)

## 2022-02-26 LAB — BASIC METABOLIC PANEL
Anion gap: 22 — ABNORMAL HIGH (ref 5–15)
Anion gap: 15 (ref 5–15)
Anion gap: 15 (ref 5–15)
BUN: 78 mg/dL — ABNORMAL HIGH (ref 6–20)
BUN: 76 mg/dL — ABNORMAL HIGH (ref 6–20)
BUN: 78 mg/dL — ABNORMAL HIGH (ref 6–20)
CO2: 16 mmol/L — ABNORMAL LOW (ref 22–32)
CO2: 18 mmol/L — ABNORMAL LOW (ref 22–32)
CO2: 19 mmol/L — ABNORMAL LOW (ref 22–32)
Calcium: 8.6 mg/dL — ABNORMAL LOW (ref 8.9–10.3)
Calcium: 8.5 mg/dL — ABNORMAL LOW (ref 8.9–10.3)
Calcium: 8.7 mg/dL — ABNORMAL LOW (ref 8.9–10.3)
Chloride: 99 mmol/L (ref 98–111)
Chloride: 103 mmol/L (ref 98–111)
Chloride: 104 mmol/L (ref 98–111)
Creatinine, Ser: 11.13 mg/dL — ABNORMAL HIGH (ref 0.44–1.00)
Creatinine, Ser: 10.86 mg/dL — ABNORMAL HIGH (ref 0.44–1.00)
Creatinine, Ser: 10.85 mg/dL — ABNORMAL HIGH (ref 0.44–1.00)
GFR, Estimated: 4 mL/min — ABNORMAL LOW (ref >60)
GFR, Estimated: 5 mL/min — ABNORMAL LOW (ref >60)
GFR, Estimated: 5 mL/min — ABNORMAL LOW (ref >60)
Glucose, Bld: 252 mg/dL — ABNORMAL HIGH (ref 70–99)
Glucose, Bld: 189 mg/dL — ABNORMAL HIGH (ref 70–99)
Glucose, Bld: 169 mg/dL — ABNORMAL HIGH (ref 70–99)
Potassium: 4.7 mmol/L (ref 3.5–5.1)
Potassium: 5.9 mmol/L — ABNORMAL HIGH (ref 3.5–5.1)
Potassium: 5.3 mmol/L — ABNORMAL HIGH (ref 3.5–5.1)
Sodium: 137 mmol/L (ref 135–145)
Sodium: 136 mmol/L (ref 135–145)
Sodium: 138 mmol/L (ref 135–145)

## 2022-02-26 LAB — BETA-HYDROXYBUTYRIC ACID
Beta-Hydroxybutyric Acid: 1.91 mmol/L — ABNORMAL HIGH (ref 0.05–0.27)
Beta-Hydroxybutyric Acid: 0.88 mmol/L — ABNORMAL HIGH (ref 0.05–0.27)

## 2022-02-26 LAB — COMPREHENSIVE METABOLIC PANEL
ALT: 16 U/L (ref 0–44)
AST: 18 U/L (ref 15–41)
Albumin: 3.3 g/dL — ABNORMAL LOW (ref 3.5–5.0)
Alkaline Phosphatase: 72 U/L (ref 38–126)
Anion gap: 20 — ABNORMAL HIGH (ref 5–15)
BUN: 72 mg/dL — ABNORMAL HIGH (ref 6–20)
CO2: 17 mmol/L — ABNORMAL LOW (ref 22–32)
Calcium: 8.8 mg/dL — ABNORMAL LOW (ref 8.9–10.3)
Chloride: 99 mmol/L (ref 98–111)
Creatinine, Ser: 10.81 mg/dL — ABNORMAL HIGH (ref 0.44–1.00)
GFR, Estimated: 5 mL/min — ABNORMAL LOW (ref >60)
Glucose, Bld: 329 mg/dL — ABNORMAL HIGH (ref 70–99)
Potassium: 5.2 mmol/L — ABNORMAL HIGH (ref 3.5–5.1)
Sodium: 136 mmol/L (ref 135–145)
Total Bilirubin: 0.8 mg/dL (ref 0.3–1.2)
Total Protein: 6.6 g/dL (ref 6.5–8.1)

## 2022-02-26 LAB — CBC
HCT: 37.3 % (ref 36.0–46.0)
Hemoglobin: 11.8 g/dL — ABNORMAL LOW (ref 12.0–15.0)
MCH: 30.8 pg (ref 26.0–34.0)
MCHC: 31.6 g/dL (ref 30.0–36.0)
MCV: 97.4 fL (ref 80.0–100.0)
Platelets: 250 10*3/uL (ref 150–400)
RBC: 3.83 MIL/uL — ABNORMAL LOW (ref 3.87–5.11)
RDW: 13.1 % (ref 11.5–15.5)
WBC: 6.1 10*3/uL (ref 4.0–10.5)
nRBC: 0 % (ref 0.0–0.2)

## 2022-02-26 LAB — CBG MONITORING, ED
Glucose-Capillary: 336 mg/dL — ABNORMAL HIGH (ref 70–99)
Glucose-Capillary: 301 mg/dL — ABNORMAL HIGH (ref 70–99)
Glucose-Capillary: 247 mg/dL — ABNORMAL HIGH (ref 70–99)
Glucose-Capillary: 236 mg/dL — ABNORMAL HIGH (ref 70–99)
Glucose-Capillary: 210 mg/dL — ABNORMAL HIGH (ref 70–99)
Glucose-Capillary: 167 mg/dL — ABNORMAL HIGH (ref 70–99)
Glucose-Capillary: 195 mg/dL — ABNORMAL HIGH (ref 70–99)
Glucose-Capillary: 175 mg/dL — ABNORMAL HIGH (ref 70–99)
Glucose-Capillary: 146 mg/dL — ABNORMAL HIGH (ref 70–99)
Glucose-Capillary: 184 mg/dL — ABNORMAL HIGH (ref 70–99)

## 2022-02-26 LAB — MAGNESIUM: Magnesium: 2.5 mg/dL — ABNORMAL HIGH (ref 1.7–2.4)

## 2022-02-26 LAB — HCG, QUANTITATIVE, PREGNANCY: hCG, Beta Chain, Quant, S: 2 m[IU]/mL (ref <5)

## 2022-02-26 LAB — LIPASE, BLOOD: Lipase: 19 U/L (ref 11–51)

## 2022-02-26 LAB — GLUCOSE, CAPILLARY: Glucose-Capillary: 210 mg/dL — ABNORMAL HIGH (ref 70–99)

## 2022-02-26 MED ORDER — METOPROLOL TARTRATE 5 MG/5ML IV SOLN
5.0000 mg | Freq: Four times a day (QID) | INTRAVENOUS | Status: DC
  Administered 2022-02-26 (×2): 5 mg via INTRAVENOUS
  Filled 2022-02-26 (×2): qty 5

## 2022-02-26 MED ORDER — LORAZEPAM 2 MG/ML IJ SOLN
0.5000 mg | INTRAMUSCULAR | Status: DC | PRN
  Administered 2022-02-26: 0.5 mg via INTRAVENOUS
  Filled 2022-02-26: qty 1

## 2022-02-26 MED ORDER — DEXTROSE 50 % IV SOLN: 0.0000 mL | INTRAVENOUS | Status: DC | PRN

## 2022-02-26 MED ORDER — METOCLOPRAMIDE HCL 5 MG/ML IJ SOLN
10.0000 mg | Freq: Once | INTRAMUSCULAR | Status: AC
  Administered 2022-02-26: 10 mg via INTRAVENOUS
  Filled 2022-02-26: qty 2

## 2022-02-26 MED ORDER — HEPARIN SODIUM (PORCINE) 5000 UNIT/ML IJ SOLN
5000.0000 [IU] | Freq: Three times a day (TID) | INTRAMUSCULAR | Status: DC
  Administered 2022-02-26: 5000 [IU] via SUBCUTANEOUS
  Filled 2022-02-26: qty 1

## 2022-02-26 MED ORDER — DELFLEX-LC/1.5% DEXTROSE 344 MOSM/L IP SOLN: INTRAPERITONEAL | Status: DC

## 2022-02-26 MED ORDER — DIPHENHYDRAMINE HCL 50 MG/ML IJ SOLN
25.0000 mg | Freq: Once | INTRAMUSCULAR | Status: AC
  Administered 2022-02-26: 25 mg via INTRAVENOUS
  Filled 2022-02-26: qty 1

## 2022-02-26 MED ORDER — SODIUM BICARBONATE 8.4 % IV SOLN
50.0000 meq | Freq: Once | INTRAVENOUS | Status: AC
  Administered 2022-02-26: 50 meq via INTRAVENOUS
  Filled 2022-02-26: qty 50

## 2022-02-26 MED ORDER — INSULIN REGULAR(HUMAN) IN NACL 100-0.9 UT/100ML-% IV SOLN
INTRAVENOUS | Status: DC
  Administered 2022-02-26: 3 [IU]/h via INTRAVENOUS

## 2022-02-26 MED ORDER — GENTAMICIN SULFATE 0.1 % EX CREA
1.0000 | TOPICAL_CREAM | Freq: Every day | CUTANEOUS | Status: DC
  Administered 2022-02-26: 1 via TOPICAL
  Filled 2022-02-26: qty 15

## 2022-02-26 MED ORDER — INSULIN GLARGINE-YFGN 100 UNIT/ML ~~LOC~~ SOLN
8.0000 [IU] | Freq: Every day | SUBCUTANEOUS | Status: DC
  Administered 2022-02-26: 8 [IU] via SUBCUTANEOUS
  Filled 2022-02-26: qty 0.08

## 2022-02-26 MED ORDER — LACTATED RINGERS IV SOLN: INTRAVENOUS | Status: DC

## 2022-02-26 MED ORDER — DEXTROSE IN LACTATED RINGERS 5 % IV SOLN: INTRAVENOUS | Status: DC

## 2022-02-26 MED ORDER — HYDRALAZINE HCL 25 MG PO TABS: 25.0000 mg | ORAL_TABLET | Freq: Four times a day (QID) | ORAL | Status: DC | PRN

## 2022-02-26 MED ORDER — INSULIN ASPART 100 UNIT/ML IJ SOLN
0.0000 [IU] | INTRAMUSCULAR | Status: DC
  Administered 2022-02-26: 2 [IU] via SUBCUTANEOUS

## 2022-02-26 MED ORDER — INSULIN REGULAR(HUMAN) IN NACL 100-0.9 UT/100ML-% IV SOLN
INTRAVENOUS | Status: DC
  Administered 2022-02-26: 5.5 [IU]/h via INTRAVENOUS
  Filled 2022-02-26: qty 100

## 2022-02-26 MED ORDER — DEXTROSE 5 % IV SOLN: INTRAVENOUS | Status: DC

## 2022-02-26 MED ORDER — CALCIUM GLUCONATE-NACL 1-0.675 GM/50ML-% IV SOLN
1.0000 g | Freq: Once | INTRAVENOUS | Status: AC
  Administered 2022-02-26: 1000 mg via INTRAVENOUS
  Filled 2022-02-26: qty 50

## 2022-02-26 MED ORDER — INSULIN GLARGINE-YFGN 100 UNIT/ML ~~LOC~~ SOLN
8.0000 [IU] | Freq: Every day | SUBCUTANEOUS | Status: DC
  Filled 2022-02-26: qty 0.08

## 2022-02-26 MED ORDER — ALBUTEROL SULFATE (2.5 MG/3ML) 0.083% IN NEBU
10.0000 mg | INHALATION_SOLUTION | Freq: Once | RESPIRATORY_TRACT | Status: AC
  Administered 2022-02-26: 10 mg via RESPIRATORY_TRACT
  Filled 2022-02-26: qty 12

## 2022-02-26 MED ORDER — CLONIDINE HCL 0.2 MG/24HR TD PTWK
0.2000 mg | MEDICATED_PATCH | TRANSDERMAL | Status: DC
  Administered 2022-02-26: 0.2 mg via TRANSDERMAL
  Filled 2022-02-26: qty 1

## 2022-02-26 NOTE — Progress Notes (Signed)
Pt admitted earlier this AM. Please see H and P for details. Briefly, DM1 with ESRD on PD, choronic systoilc chf with recurrent admits for intractable N/V presents with N/V. Pt found to be in DKA requiring insulin gtt. Anion gap closed and b-hydroxybutyric acid improved with insulin gtt. Nephrology consulted for PD  Plan 1. Intractable N/V  - Presents with N/V, a recurring issue for her  - This AM, continued to complain of severe nausea despite Reglan and Benadryl in ED  - She has normal gastric emptying study on 02/01/22  - Treatment complicated by prolonged QT interval  - cont supportive care   2. Early DKA; type I DM  - Type I diabetic with A1c 9.2% in March 2023 presents with N/V and found to be hyperglycemic with low serum bicarbonate and elevated AG  - She was started on IV insulin infusion with closure of anion gap - transitioned to subq insulin per Diabetic Coordinator recs   3. ESRD  - Does nightly PD  - consulted Nephrology for PD recs, appreciate input -Most recent bmet with persistent hyperkalemia of 5.3   4. Chronic systolic CHF  - EF was 28% on TTE in March 2023  - Hold Entresto until potassium normalizes, restrict fluids    5. Prolonged QT interval  - QTc 517 ms in ED  - Continue cardiac monitoring, check magnesium, avoid QT-prolonging medications    6. Hyperkalemia -Cont on PD

## 2022-02-26 NOTE — H&P (Signed)
History and Physical    Preeya Cleckley SWF:[id number] DOB: [birth year] DOA: 02/26/2022  PCP: Medicine, [facility]   Patient coming from: Home  Chief Complaint: N/V   HPI: Kathy Lewis is a pleasant 26 y.o. female with medical history significant for poorly controlled type 1 diabetes mellitus, ESRD on peritoneal dialysis, chronic systolic CHF, and recurrent admissions with intractable nausea and vomiting who presents emergency department with nausea and vomiting.  Patient reports that the symptoms have been going on for months but worse again over the past 2 to 3 days.  She reports being unable to keep anything down.  She has chronic abdominal pain, mainly in the upper abdomen, but denies any fevers or chills.  She reports adherence with nightly peritoneal dialysis.  She had a gastric emptying study last month which was normal.  ED Course: Upon arrival to the ED, patient is found to be afebrile and saturating well on room air with mild tachycardia and stable blood pressure.  EKG features sinus tachycardia 304, LAFB, and QTc interval 517 ms.  Chemistry panel notable for potassium 5.2, bicarbonate 17, BUN 72, glucose 329, and anion gap of 20.  Patient was treated with Benadryl and Reglan, and started on IV insulin infusion in the ED.  Review of Systems:  All other systems reviewed and apart from HPI, are negative.  Past Medical History:  Diagnosis Date   Anxiety    Bipolar 2 disorder (HCC)    Chronic systolic (congestive) heart failure (HCC)    Depression    DKA (diabetic ketoacidoses)    ESRD on peritoneal dialysis ([place])    HSV infection    on valtrex   Hypokalemia    Leukocytosis    Migraine    Noncompliance with medication regimen    Preeclampsia    Severe anemia    Type 1 diabetes mellitus ([place])     Past Surgical History:  Procedure Laterality Date   CARDIAC CATHETERIZATION     DILATION AND EVACUATION N/A 10/22/2019   Procedure: ULTRASOUND GUIDED DILATATION  AND EVACUATION;  Surgeon: [name], MD;  Location: [facility] LD ORS;  Service: Gynecology;  Laterality: N/A;   NO PAST SURGERIES     peritoneal dialysis catheter insertion     RENAL BIOPSY      Social History:   reports that she has quit smoking. Her smoking use included cigars and cigarettes. She has a 2.00 pack-year smoking history. She has never used smokeless tobacco. She reports that she does not drink alcohol and does not use drugs.  Allergies  Allergen Reactions   Cantaloupe Extract Allergy Skin Test Itching    Mouth itching     Strawberry Extract Itching    Mouth itches   Citrullus Vulgaris Itching    Makes mouth itch , ALL melons    Nsaids Other (See Comments)    Avoid per nephrology    Family History  Adopted: Yes  Problem Relation Age of Onset   Heart disease Neg Hx      Prior to Admission medications   Medication Sig Start Date End Date Taking? Authorizing Provider  atorvastatin (LIPITOR) 40 MG tablet Take 80 mg by mouth daily. 08/03/21   [provider]  azelastine (ASTELIN) 0.1 % nasal spray Place 2 sprays into both nostrils 2 (two) times daily. Use in each nostril as directed Patient taking differently: Place 2 sprays into both nostrils 2 (two) times daily as needed for allergies. Use in each nostril as directed 12/14/21  Hunsucker, Bonna Gains, MD  calcitRIOL (ROCALTROL) 0.25 MCG capsule Take 0.25 mcg by mouth daily. 08/03/21   [provider]  carvedilol (COREG) 12.5 MG tablet Take 6.25 mg by mouth 2 (two) times daily with a meal.    [provider]  ENTRESTO 49-51 MG Take 1 tablet by mouth 2 (two) times daily. 12/23/21   [provider]  FEROSUL 325 (65 Fe) MG tablet Take 325 mg by mouth 3 (three) times daily. 04/30/21   [provider]  fluticasone (FLONASE) 50 MCG/ACT nasal spray Place 1 spray into both nostrils in the morning and at bedtime. Patient taking differently: Place 1 spray into both nostrils 2 (two) times  daily as needed for allergies. 12/14/21   Hunsucker, Bonna Gains, MD  hydrALAZINE (APRESOLINE) 100 MG tablet Take 100 mg by mouth 3 (three) times daily. 08/05/21   [provider]  insulin lispro (HUMALOG) 100 UNIT/ML injection 0-10 Units as directed. Sliding Scale 1 unit per 8 carbs, 1/2 unit per hour 01/08/22   [provider]  Insulin Syringe-Needle U-100 (INSULIN SYRINGE .3CC/31GX5/16") 31G X 5/16" 0.3 ML MISC Use as directed 10/27/19   [name], MD  isosorbide mononitrate (IMDUR) 30 MG 24 hr tablet Take 30 mg by mouth daily.    [provider]  magnesium oxide (MAG-OX) 400 MG tablet Take 800 mg by mouth daily. 08/03/21   [provider]  metoCLOPramide (REGLAN) 10 MG tablet Take 1 tablet (10 mg total) by mouth every 8 (eight) hours as needed for nausea. 01/17/22   Lucrezia Starch, MD  prochlorperazine (COMPAZINE) 5 MG tablet Take 1 tablet (5 mg total) by mouth every 6 (six) hours as needed for nausea or vomiting. 01/21/22   Regan Lemming, MD  Vitamin D, Ergocalciferol, (DRISDOL) 1.25 MG (50000 UNIT) CAPS capsule Take 50,000 Units by mouth every Sunday.    [provider]  escitalopram (LEXAPRO) 10 MG tablet Take 20 mg by mouth daily.  05/10/20 10/06/20  [provider]  furosemide (LASIX) 40 MG tablet Take 1 tablet (40 mg total) by mouth daily. Patient not taking: No sig reported 10/28/19 02/17/21  [name], MD  lisinopril (ZESTRIL) 10 MG tablet Take 10 mg by mouth daily.  02/23/20 10/06/20  [provider]  spironolactone (ALDACTONE) 25 MG tablet Take 25 mg by mouth daily.  04/20/20 10/06/20  [provider]    Physical Exam: Vitals:   02/26/22 0103 02/26/22 0245 02/26/22 0315 02/26/22 0345  BP:  (!) 166/119 (!) 168/122 (!) 169/118  Pulse:  (!) 103 (!) 109 (!) 108  Resp:   19 18  Temp:      TempSrc:      SpO2:  95% 98% 98%  Weight: 62.2 kg     Height: 5' (1.524 m)       Constitutional: NAD, calm  Eyes: PERTLA,  lids and conjunctivae normal ENMT: Mucous membranes are moist. Posterior pharynx clear of any exudate or lesions.   Neck: supple, no masses  Respiratory: no wheezing, no crackles. No accessory muscle use.  Cardiovascular: S1 & S2 heard, regular rate and rhythm. No extremity edema.  Abdomen: Soft, no rebound pain or guarding. Bowel sounds active.  Musculoskeletal: no clubbing / cyanosis. No joint deformity upper and lower extremities.   Skin: no significant rashes, lesions, ulcers. Warm, dry, well-perfused. Neurologic: CN 2-12 grossly intact. Sensation intact. Strength 5/5 in all 4 limbs. Alert and oriented.  Psychiatric: Calm. Cooperative.    Labs and Imaging on  Admission: I have personally reviewed following labs and imaging studies  CBC: Recent Labs  Lab 02/26/22 0158  WBC 6.1  HGB 11.8*  HCT 37.3  MCV 97.4  PLT 979   Basic Metabolic Panel: Recent Labs  Lab 02/26/22 0158  NA 136  K 5.2*  CL 99  CO2 17*  GLUCOSE 329*  BUN 72*  CREATININE 10.81*  CALCIUM 8.8*   GFR: Estimated Creatinine Clearance: 6.5 mL/min (A) (by C-G formula based on SCr of 10.81 mg/dL (H)). Liver Function Tests: Recent Labs  Lab 02/26/22 0158  AST 18  ALT 16  ALKPHOS 72  BILITOT 0.8  PROT 6.6  ALBUMIN 3.3*   Recent Labs  Lab 02/26/22 0158  LIPASE 19   No results for input(s): "AMMONIA" in the last 168 hours. Coagulation Profile: No results for input(s): "INR", "PROTIME" in the last 168 hours. Cardiac Enzymes: No results for input(s): "CKTOTAL", "CKMB", "CKMBINDEX", "TROPONINI" in the last 168 hours. BNP (last 3 results) No results for input(s): "PROBNP" in the last 8760 hours. HbA1C: No results for input(s): "HGBA1C" in the last 72 hours. CBG: Recent Labs  Lab 02/26/22 0250 02/26/22 0418  GLUCAP 336* 301*   Lipid Profile: No results for input(s): "CHOL", "HDL", "LDLCALC", "TRIG", "CHOLHDL", "LDLDIRECT" in the last 72 hours. Thyroid Function Tests: No results for input(s):  "TSH", "T4TOTAL", "FREET4", "T3FREE", "THYROIDAB" in the last 72 hours. Anemia Panel: No results for input(s): "VITAMINB12", "FOLATE", "FERRITIN", "TIBC", "IRON", "RETICCTPCT" in the last 72 hours. Urine analysis:    Component Value Date/Time   COLORURINE YELLOW 08/11/2021 1421   APPEARANCEUR CLEAR 08/11/2021 1421   LABSPEC 1.020 08/11/2021 1421   PHURINE 6.0 08/11/2021 1421   GLUCOSEU >=500 (A) 08/11/2021 1421   HGBUR SMALL (A) 08/11/2021 1421   BILIRUBINUR NEGATIVE 08/11/2021 1421   KETONESUR NEGATIVE 08/11/2021 1421   PROTEINUR >300 (A) 08/11/2021 1421   UROBILINOGEN 0.2 12/15/2015 1736   NITRITE NEGATIVE 08/11/2021 1421   LEUKOCYTESUR NEGATIVE 08/11/2021 1421   Sepsis Labs: @LABRCNTIP (procalcitonin:4,lacticidven:4) )No results found for this or any previous visit (from the past 240 hour(s)).   Radiological Exams on Admission: No results found.  EKG: Independently reviewed. Sinus tachycardia, rate 104, LAFB, QTc 517 ms.   Assessment/Plan   1. Intractable N/V  - Presents with N/V, a recurring issue for her  - She continues to complain of severe nausea despite Reglan and Benadryl in ED  - Exam is benign, no infectious s/s, no evidence for CNS etiology   - She has normal gastric emptying study on 02/01/22  - Treatment complicated by prolonged QT interval  - Control sugars with IV insulin infusion, monitor electrolytes, use low-dose Ativan as needed for now, advance diet as tolerated    2. Early DKA; type I DM  - Type I diabetic with A1c 9.2% in March 2023 presents with N/V and found to be hyperglycemic with low serum bicarbonate and elevated AG  - She was started on IV insulin infusion in ED  - Continue insulin infusion with frequent CBGs and serial chem panels    3. ESRD  - Does nightly PD  - Potassium slightly high and bicarb 17, anticipate improvement with insulin/glycemic-control  - Restrict fluids, renally-dose medications, consult nephrology in am for maintenance  dialysis   4. Chronic systolic CHF  - EF was 89% on TTE in March 2023  - Hold Entresto until potassium normalizes, restrict fluids   5. Prolonged QT interval  - QTc 517 ms in ED  -  Continue cardiac monitoring, check magnesium, avoid QT-prolonging medications     DVT prophylaxis: sq heparin  Code Status: Full  Level of Care: Level of care: Progressive Family Communication: None present  Disposition Plan:  Patient is from: home  Anticipated d/c is to: Home  Anticipated d/c date is: Possibly as early as 02/27/22  Patient currently: Pending improvement in N/V and tolerance of adequate oral intake  Consults called: nephrology  Admission status: Observation     Vianne Bulls, MD [facility]  02/26/2022, 5:31 AM

## 2022-02-26 NOTE — ED Provider Notes (Signed)
[facility] EMERGENCY DEPARTMENT Provider Note   CSN: [id number] Arrival date & time: 02/26/22  0051     History  Chief Complaint  Patient presents with   Emesis    Kathy Lewis is a 26 y.o. female.  The history is provided by the patient.  Emesis Severity:  Severe Timing:  Intermittent Progression:  Worsening Chronicity:  New Relieved by:  Nothing Associated symptoms: diarrhea   Associated symptoms: no fever   Patient with history of end-stage renal disease on peritoneal dialysis, diabetes, bipolar presents with nausea vomiting.  Patient reports she has had abdominal pain for at least a month.  Over the past days she has had multiple episodes of nausea vomiting and diarrhea.  No obvious bloody vomit or stool.  No chest pain or shortness of breath.  She has had the symptoms previously She takes peritoneal dialysis nightly    Past Medical History:  Diagnosis Date   Anxiety    Bipolar 2 disorder (HCC)    Chronic systolic (congestive) heart failure (HCC)    Depression    DKA (diabetic ketoacidoses)    ESRD on peritoneal dialysis ([place])    HSV infection    on valtrex   Hypokalemia    Leukocytosis    Migraine    Noncompliance with medication regimen    Preeclampsia    Severe anemia    Type 1 diabetes mellitus ([place])     Home Medications Prior to Admission medications   Medication Sig Start Date End Date Taking? Authorizing Provider  atorvastatin (LIPITOR) 40 MG tablet Take 80 mg by mouth daily. 08/03/21   [provider]  azelastine (ASTELIN) 0.1 % nasal spray Place 2 sprays into both nostrils 2 (two) times daily. Use in each nostril as directed Patient taking differently: Place 2 sprays into both nostrils 2 (two) times daily as needed for allergies. Use in each nostril as directed 12/14/21   Hunsucker, Bonna Gains, MD  calcitRIOL (ROCALTROL) 0.25 MCG capsule Take 0.25 mcg by mouth daily. 08/03/21   [provider]  carvedilol (COREG)  12.5 MG tablet Take 6.25 mg by mouth 2 (two) times daily with a meal.    [provider]  ENTRESTO 49-51 MG Take 1 tablet by mouth 2 (two) times daily. 12/23/21   [provider]  FEROSUL 325 (65 Fe) MG tablet Take 325 mg by mouth 3 (three) times daily. 04/30/21   [provider]  fluticasone (FLONASE) 50 MCG/ACT nasal spray Place 1 spray into both nostrils in the morning and at bedtime. Patient taking differently: Place 1 spray into both nostrils 2 (two) times daily as needed for allergies. 12/14/21   Hunsucker, Bonna Gains, MD  hydrALAZINE (APRESOLINE) 100 MG tablet Take 100 mg by mouth 3 (three) times daily. 08/05/21   [provider]  insulin lispro (HUMALOG) 100 UNIT/ML injection 0-10 Units as directed. Sliding Scale 1 unit per 8 carbs, 1/2 unit per hour 01/08/22   [provider]  Insulin Syringe-Needle U-100 (INSULIN SYRINGE .3CC/31GX5/16") 31G X 5/16" 0.3 ML MISC Use as directed 10/27/19   [name], MD  isosorbide mononitrate (IMDUR) 30 MG 24 hr tablet Take 30 mg by mouth daily.    [provider]  magnesium oxide (MAG-OX) 400 MG tablet Take 800 mg by mouth daily. 08/03/21   [provider]  metoCLOPramide (REGLAN) 10 MG tablet Take 1 tablet (10 mg total) by mouth every 8 (eight) hours as needed for nausea. 01/17/22   Madalyn Rob  S, MD  prochlorperazine (COMPAZINE) 5 MG tablet Take 1 tablet (5 mg total) by mouth every 6 (six) hours as needed for nausea or vomiting. 01/21/22   Regan Lemming, MD  Vitamin D, Ergocalciferol, (DRISDOL) 1.25 MG (50000 UNIT) CAPS capsule Take 50,000 Units by mouth every Sunday.    [provider]  escitalopram (LEXAPRO) 10 MG tablet Take 20 mg by mouth daily.  05/10/20 10/06/20  [provider]  furosemide (LASIX) 40 MG tablet Take 1 tablet (40 mg total) by mouth daily. Patient not taking: No sig reported 10/28/19 02/17/21  [name], MD  lisinopril (ZESTRIL) 10 MG tablet Take 10 mg by  mouth daily.  02/23/20 10/06/20  [provider]  spironolactone (ALDACTONE) 25 MG tablet Take 25 mg by mouth daily.  04/20/20 10/06/20  [provider]      Allergies    Cantaloupe extract allergy skin test, Strawberry extract, Citrullus vulgaris, and Nsaids    Review of Systems   Review of Systems  Constitutional:  Negative for fever.  Respiratory:  Negative for shortness of breath.   Cardiovascular:  Negative for chest pain.  Gastrointestinal:  Positive for diarrhea, nausea and vomiting. Negative for blood in stool.    Physical Exam Updated Vital Signs BP (!) 157/111 (BP Location: Right Arm)   Pulse 100   Temp 98.2 F (36.8 C) (Oral)   Resp 18   Ht 1.524 m (5')   Wt 62.2 kg   SpO2 96%   BMI 26.78 kg/m  Physical Exam CONSTITUTIONAL: Ill-appearing, appears older than stated age HEAD: Normocephalic/atraumatic EYES: EOMI/PERRL, no scleral icterus ENMT: Mucous membranes dry NECK: supple no meningeal signs SPINE/BACK:entire spine nontender CV: S1/S2 noted, tachycardic LUNGS: Lungs are clear to auscultation bilaterally, no apparent distress ABDOMEN: soft, nontender, no rebound or guarding, bowel sounds noted throughout abdomen, PD catheter noted without any obvious drainage or erythema GU:no cva tenderness NEURO: Pt is awake/alert/appropriate, moves all extremitiesx4.  No facial droop.   EXTREMITIES: pulses normal/equal, full ROM SKIN: warm, color normal PSYCH: no abnormalities of mood noted, alert and oriented to situation  ED Results / Procedures / Treatments   Labs (all labs ordered are listed, but only abnormal results are displayed) Labs Reviewed  COMPREHENSIVE METABOLIC PANEL - Abnormal; Notable for the following components:      Result Value   Potassium 5.2 (*)    CO2 17 (*)    Glucose, Bld 329 (*)    BUN 72 (*)    Creatinine, Ser 10.81 (*)    Calcium 8.8 (*)    Albumin 3.3 (*)    GFR, Estimated 5 (*)    Anion gap 20 (*)    All other components  within normal limits  CBC - Abnormal; Notable for the following components:   RBC 3.83 (*)    Hemoglobin 11.8 (*)    All other components within normal limits  BLOOD GAS, VENOUS - Abnormal; Notable for the following components:   pCO2, Ven 40 (*)    pO2, Ven 47 (*)    Bicarbonate 17.5 (*)    Acid-base deficit 9.2 (*)    All other components within normal limits  CBG MONITORING, ED - Abnormal; Notable for the following components:   Glucose-Capillary 336 (*)    All other components within normal limits  LIPASE, BLOOD  HCG, QUANTITATIVE, PREGNANCY    EKG EKG Interpretation  Date/Time:  Sunday February 26 2022 02:47:45 EDT Ventricular Rate:  104 PR Interval:  145 QRS Duration: 98  QT Interval:  393 QTC Calculation: 517 R Axis:   -46 Text Interpretation: Sinus tachycardia Left anterior fascicular block Prolonged QT interval Abnormal ekg Confirmed by Ripley Fraise [phone]) on 02/26/2022 2:50:37 AM  Radiology No results found.  Procedures .Critical Care  Performed by: Ripley Fraise, MD Authorized by: Ripley Fraise, MD   Critical care provider statement:    Critical care time (minutes):  59   Critical care start time:  02/26/2022 3:00 AM   Critical care end time:  02/26/2022 3:59 AM   Critical care was necessary to treat or prevent imminent or life-threatening deterioration of the following conditions:  Renal failure, dehydration, endocrine crisis and metabolic crisis   Critical care was time spent personally by me on the following activities:  Development of treatment plan with patient or surrogate, re-evaluation of patient's condition, ordering and review of laboratory studies, ordering and performing treatments and interventions, review of old charts, pulse oximetry, examination of patient and evaluation of patient's response to treatment   I assumed direction of critical care for this patient from another provider in my specialty: no     Care discussed with: admitting provider        Medications Ordered in ED Medications  insulin regular, human (MYXREDLIN) 100 units/ 100 mL infusion (5.5 Units/hr Intravenous New Bag/Given 02/26/22 0302)  dextrose 50 % solution 0-50 mL (has no administration in time range)  dextrose 5 % in lactated ringers infusion (0 mLs Intravenous Hold 02/26/22 0357)  metoCLOPramide (REGLAN) injection 10 mg (10 mg Intravenous Given 02/26/22 0252)  diphenhydrAMINE (BENADRYL) injection 25 mg (25 mg Intravenous Given 02/26/22 0249)    ED Course/ Medical Decision Making/ A&P Clinical Course as of 02/26/22 0358  Sun Feb 26, 2022  0236 Patient with extensive medical history presents for her ninth ER visit in 6 months.  She presents with recurrent nausea vomiting.  She reports her abdominal pain is chronic.  Patient suspects it may be due to her hyperglycemia.  Will defer imaging for now, will treat symptoms and reassess.  Review of chart reveals recent negative gastric emptying study [DW]  0247 Labs reveal early DKA.  Insulin drip is been ordered, she will need to be admitted [DW]  0357 Glucose-Capillary(!): 336 Hyperglycemia [DW]  0357 Potassium(!): 5.2 Mild hyperkalemia [DW]  0357 Anion gap(!): 20 Diabetic ketoacidosis noted [DW]  2355 Discussed with Dr. Myna Hidalgo for admission Patient reports feeling improved.  Denies any new abdominal pain or declines pain medicine at this time [DW]    Clinical Course User Index [DW] Ripley Fraise, MD                           Medical Decision Making Amount and/or Complexity of Data Reviewed Labs: ordered. Decision-making details documented in ED Course. ECG/medicine tests: ordered.  Risk Prescription drug management. Decision regarding hospitalization.   This patient presents to the ED for concern of vomiting/diarrhea, this involves an extensive number of treatment options, and is a complaint that carries with it a high risk of complications and morbidity.  The differential diagnosis includes but is not  limited to bowel obstruction, gastroenteritis, acute coronary syndrome, diabetic ketoacidosis  Comorbidities that complicate the patient evaluation: Patient's presentation is complicated by their history of ESRD and diabetes  Social Determinants of Health: Patient's  history of nonadherence   increases the complexity of managing their presentation  Additional history obtained: Records reviewed previous admission documents  Lab Tests: I Ordered, and personally  interpreted labs.  The pertinent results include: End-stage renal disease, diabetic ketoacidosis, hyperkalemia  Imaging Studies ordered: No indication for imaging  Cardiac Monitoring: The patient was maintained on a cardiac monitor.  I personally viewed and interpreted the cardiac monitor which showed an underlying rhythm of:  sinus tachycardia  Medicines ordered and prescription drug management: I ordered medication including Reglan for nausea Reevaluation of the patient after these medicines showed that the patient    improved  Test Considered: Considered CT abdomen pelvis but patient denies any new abdominal pain at this time.  Will defer imaging  Critical Interventions:  IV insulin for diabetic ketoacidosis  Consultations Obtained: I requested consultation with the admitting physician Dr. Myna Hidalgo with [facility] , and discussed  findings as well as pertinent plan - they recommend: Admit  Reevaluation: After the interventions noted above, I reevaluated the patient and found that they have :improved  Complexity of problems addressed: Patient's presentation is most consistent with  acute presentation with potential threat to life or bodily function  Disposition: After consideration of the diagnostic results and the patient's response to treatment,  I feel that the patent would benefit from admission   .           Final Clinical Impression(s) / ED Diagnoses Final diagnoses:  Diabetic ketoacidosis without coma  associated with type 1 diabetes mellitus ([place])  ESRD (end stage renal disease) ([place])    Rx / DC Orders ED Discharge Orders     None         Ripley Fraise, MD 02/26/22 0400

## 2022-02-26 NOTE — ED Triage Notes (Signed)
The pt arrived by [facility] from home  she has had nausea and vomiting for s month worse for the past 2-3 days  she is peritoneal dialysis she does not know if she is in keto acidosis or not  lmp none

## 2022-02-26 NOTE — Consult Note (Addendum)
Renal Service Consult Note [facility]  Kathy Lewis 02/26/2022 [name], MD Requesting Physician: Dr. Wyline Copas  Reason for Consult: ESRD pt DKA HPI: The patient is a 26 y.o. year-old w/ hx of anxiety, bipolar d/o, HFrEF, DM1, hx DKA, ESRD on PD, migraine, anemia who presented to ED w/ N/V for several days. Also abd pain, which is a chronic issue. In ED pt found to be in DKA w/ high anion gap, K+ 5.2, BUN 72. Started on IV insulin.   Pt seen in ED. Not in distress, but very uncomfortable due to nausea (+/- pain). No SOB, cough , has not aspirated.   Pt lives w/ her 35 yr old daughter.  Finds it hard to do her PD sometimes if the child is acting up, or if she feels really sick . Last PD was Wed night (missed last 3 nights).   ROS - denies CP, no joint pain, no HA, no blurry vision, no rash, no dysuria, no difficulty voiding   Past Medical History  Past Medical History:  Diagnosis Date   Anxiety    Bipolar 2 disorder (HCC)    Chronic systolic (congestive) heart failure (HCC)    Depression    DKA (diabetic ketoacidoses)    ESRD on peritoneal dialysis ([place])    HSV infection    on valtrex   Hypokalemia    Leukocytosis    Migraine    Noncompliance with medication regimen    Preeclampsia    Severe anemia    Type 1 diabetes mellitus ([place])    Past Surgical History  Past Surgical History:  Procedure Laterality Date   CARDIAC CATHETERIZATION     DILATION AND EVACUATION N/A 10/22/2019   Procedure: ULTRASOUND GUIDED DILATATION AND EVACUATION;  Surgeon: [name], MD;  Location: [facility] LD ORS;  Service: Gynecology;  Laterality: N/A;   NO PAST SURGERIES     peritoneal dialysis catheter insertion     RENAL BIOPSY     Family History  Family History  Adopted: Yes  Problem Relation Age of Onset   Heart disease Neg Hx    Social History  reports that she has quit smoking. Her smoking use included cigars and cigarettes. She has a 2.00 pack-year smoking history. She  has never used smokeless tobacco. She reports that she does not drink alcohol and does not use drugs. Allergies  Allergies  Allergen Reactions   Cantaloupe Extract Allergy Skin Test Itching    Mouth itching     Strawberry Extract Itching    Mouth itches   Citrullus Vulgaris Itching    Makes mouth itch , ALL melons    Nsaids Other (See Comments)    Avoid per nephrology   Home medications Prior to Admission medications   Medication Sig Start Date End Date Taking? Authorizing Provider  acetaminophen (TYLENOL) 500 MG tablet Take 1,000 mg by mouth every 6 (six) hours as needed for headache (pain).   Yes [provider]  butalbital-acetaminophen-caffeine (FIORICET) 50-325-40 MG tablet Take 1 tablet by mouth daily as needed for headache.   Yes [provider]  calcitRIOL (ROCALTROL) 0.25 MCG capsule Take 0.25 mcg by mouth every morning. 08/03/21  Yes [provider]  Calcium Carbonate Antacid (TUMS PO) Take 2 tablets by mouth 3 (three) times daily with meals.   Yes [provider]  carvedilol (COREG) 12.5 MG tablet Take 12.5 mg by mouth 2 (two) times daily with a meal.   Yes [provider]  ferrous sulfate  325 (65 FE) MG tablet Take 325 mg by mouth 3 (three) times daily with meals.   Yes [provider]  hydrALAZINE (APRESOLINE) 100 MG tablet Take 100 mg by mouth 3 (three) times daily. 08/05/21  Yes [provider]  Insulin Disposable Pump (OMNIPOD 5 G6 POD, GEN 5,) MISC Inject into the skin continuous. Use with Humalog   Yes [provider]  insulin lispro (HUMALOG) 100 UNIT/ML injection Inject 7 Units into the skin once. 01/08/22  Yes [provider]  magnesium oxide (MAG-OX) 400 MG tablet Take 400 mg by mouth daily. 08/03/21  Yes [provider]  metoCLOPramide (REGLAN) 10 MG tablet Take 1 tablet (10 mg total) by mouth every 8 (eight) hours as needed for nausea. 01/17/22  Yes Lucrezia Starch, MD   ondansetron (ZOFRAN) 4 MG tablet Take 4 mg by mouth every 8 (eight) hours as needed for nausea or vomiting.   Yes [provider]  prochlorperazine (COMPAZINE) 5 MG tablet Take 1 tablet (5 mg total) by mouth every 6 (six) hours as needed for nausea or vomiting. 01/21/22  Yes Regan Lemming, MD  sacubitril-valsartan (ENTRESTO) 49-51 MG Take 1 tablet by mouth every morning.   Yes [provider]  Vitamin D, Ergocalciferol, (DRISDOL) 1.25 MG (50000 UNIT) CAPS capsule Take 50,000 Units by mouth every Sunday.   Yes [provider]  azelastine (ASTELIN) 0.1 % nasal spray Place 2 sprays into both nostrils 2 (two) times daily. Use in each nostril as directed Patient not taking: Reported on 02/26/2022 12/14/21   Hunsucker, Bonna Gains, MD  fluticasone [facility]) 50 MCG/ACT nasal spray Place 1 spray into both nostrils in the morning and at bedtime. Patient not taking: Reported on 02/26/2022 12/14/21   Hunsucker, Bonna Gains, MD  Insulin Syringe-Needle U-100 (INSULIN SYRINGE .3CC/31GX5/16") 31G X 5/16" 0.3 ML MISC Use as directed 10/27/19   [name], MD  escitalopram (LEXAPRO) 10 MG tablet Take 20 mg by mouth daily.  05/10/20 10/06/20  [provider]  furosemide (LASIX) 40 MG tablet Take 1 tablet (40 mg total) by mouth daily. Patient not taking: No sig reported 10/28/19 02/17/21  [name], MD  lisinopril (ZESTRIL) 10 MG tablet Take 10 mg by mouth daily.  02/23/20 10/06/20  [provider]  spironolactone (ALDACTONE) 25 MG tablet Take 25 mg by mouth daily.  04/20/20 10/06/20  [provider]     Vitals:   02/26/22 0700 02/26/22 0900 02/26/22 0915 02/26/22 1035  BP: (!) 168/126 (!) 157/113 (!) 166/125   Pulse: (!) 114 (!) 115 (!) 116   Resp: (!) 28 (!) 23 (!) 21   Temp:    98.9 F (37.2 C)  TempSrc:    Oral  SpO2: 94% 97% 94%   Weight:      Height:       Exam Gen alert, small framed AAF, looks uncomfortable but no ^wob  No rash, cyanosis or  gangrene Sclera anicteric, throat clear  No jvd or bruits Chest clear bilat to bases, no rales/ wheezing RRR no MRG Abd soft ntnd no mass or ascites +bs, +PD cath intact GU defer MS no joint effusions or deformity Ext no LE or UE edema, no wounds or ulcers Neuro is alert, Ox 3 , nf     Home meds include - calcitriol, calc carbonate 2 tid, carvedilol 12.5 bid, hydralazine 100 tid, insulin lispro, metoclopramide 10 tid prn, sacubitril-valsartan, prns/ vits/ supps   OP PD: 4 exchanges overnight, no daybag, 1500  cc dwells, f/b [place] [facility]   Assessment/ Plan: DKA - hx T1DM, on IV insulin gtt, w/ intractable N/F. Should not need fluid resuscitation w/ ESRD hx. If needs BS support would use D5W at whatever dose needed (avoid LR and NS).  ESRD - on CCPD x 9 mos. F/b [place] [facility] (used to live in [place]). PD tonight w/ all 1.5% fluids.  Hyperkalemia - K+ 5.9 here. Due to missed PD and acidemia. Doubt will take lokelma, will give some temporizing measures, and should come down w/ IV insulin. Also will get PD tonight.  HTN - prob not able to take pills here, would start clonidine patch #2 and scheduled IV metoprolol 5mg  q 6hrs for now.  Volume - not vol depleted, euvolemic, possibly up a bit. Will get CXR for baseline given esrd.  HFrEF - get CXR      Kelly Splinter  MD 02/26/2022, 11:38 AM Recent Labs  Lab 02/26/22 0158 02/26/22 0700 02/26/22 0942  HGB 11.8*  --   --   ALBUMIN 3.3*  --   --   CALCIUM 8.8* 8.6* 8.5*  CREATININE 10.81* 11.13* 10.86*  K 5.2* 4.7 5.9*

## 2022-02-26 NOTE — Inpatient Diabetes Management (Signed)
Inpatient Diabetes Program Recommendations  AACE/ADA: New Consensus Statement on Inpatient Glycemic Control (2015)  Target Ranges:  Prepandial:   less than 140 mg/dL      Peak postprandial:   less than 180 mg/dL (1-2 hours)      Critically ill patients:  140 - 180 mg/dL   Lab Results  Component Value Date   GLUCAP 210 (H) 02/26/2022   HGBA1C 9.2 (H) 11/23/2021    Review of Glycemic Control  Latest Reference Range & Units 02/26/22 05:36 02/26/22 06:54 02/26/22 08:07  Glucose-Capillary 70 - 99 mg/dL 247 (H) 236 (H) 210 (H)  (H): Data is abnormally high Diabetes history: Type 1 DM Outpatient Diabetes medications:  OmniPod using Humalog Basal- 0.5 units/hr--12 units in 24 hrs. ICR-1 units: 8 CHOs Target 130 mg/dL ENDO-Dr. Chalmers Cater Current orders for Inpatient glycemic control:  IV insulin/DKA order set  Inpatient Diabetes Program Recommendations:    Agree with IV insulin.  If insulin pump will be restarted once acidosis cleared, she will need to restart new site with new supplies.   If insulin pump not restarted, recommend Semglee 8 units when transitioned off insulin drip plus Novolog "very sensitive" q 4 hours.  Will need meal coverage added once patient eating consistently.   Called and discussed with RN.  Patient is currently very sleepy and nephrology to see this morning.  She does not have pump supplies with her so once ready for transition, will need basal/bolus.  Thanks,  Adah Perl, RN, BC-ADM Inpatient Diabetes Coordinator Pager [phone]  (8a-5p)

## 2022-02-27 ENCOUNTER — Encounter (HOSPITAL_BASED_OUTPATIENT_CLINIC_OR_DEPARTMENT_OTHER): Payer: Self-pay | Admitting: Urology

## 2022-02-27 ENCOUNTER — Other Ambulatory Visit: Payer: Self-pay

## 2022-02-27 ENCOUNTER — Emergency Department (HOSPITAL_BASED_OUTPATIENT_CLINIC_OR_DEPARTMENT_OTHER)
Admission: EM | Admit: 2022-02-27 | Discharge: 2022-02-27 | Disposition: A | Discharge status: Home / Self Care | Payer: Medicaid Other | Attending: Emergency Medicine | Admitting: Emergency Medicine

## 2022-02-27 DIAGNOSIS — Z87891 Personal history of nicotine dependence: Secondary | ICD-10-CM | POA: Insufficient documentation

## 2022-02-27 DIAGNOSIS — N186 End stage renal disease: Secondary | ICD-10-CM | POA: Diagnosis not present

## 2022-02-27 DIAGNOSIS — E1022 Type 1 diabetes mellitus with diabetic chronic kidney disease: Secondary | ICD-10-CM | POA: Insufficient documentation

## 2022-02-27 DIAGNOSIS — I5022 Chronic systolic (congestive) heart failure: Secondary | ICD-10-CM | POA: Diagnosis not present

## 2022-02-27 DIAGNOSIS — R112 Nausea with vomiting, unspecified: Secondary | ICD-10-CM | POA: Diagnosis present

## 2022-02-27 DIAGNOSIS — R1013 Epigastric pain: Secondary | ICD-10-CM | POA: Insufficient documentation

## 2022-02-27 DIAGNOSIS — Z992 Dependence on renal dialysis: Secondary | ICD-10-CM | POA: Diagnosis not present

## 2022-02-27 LAB — MAGNESIUM: Magnesium: 2.3 mg/dL (ref 1.7–2.4)

## 2022-02-27 LAB — CBC WITH DIFFERENTIAL/PLATELET
Abs Immature Granulocytes: 0.04 10*3/uL (ref 0.00–0.07)
Basophils Absolute: 0 10*3/uL (ref 0.0–0.1)
Basophils Relative: 0 %
Eosinophils Absolute: 0 10*3/uL (ref 0.0–0.5)
Eosinophils Relative: 0 %
HCT: 31.9 % — ABNORMAL LOW (ref 36.0–46.0)
Hemoglobin: 10.9 g/dL — ABNORMAL LOW (ref 12.0–15.0)
Immature Granulocytes: 1 %
Lymphocytes Relative: 8 %
Lymphs Abs: 0.7 10*3/uL (ref 0.7–4.0)
MCH: 31.6 pg (ref 26.0–34.0)
MCHC: 34.2 g/dL (ref 30.0–36.0)
MCV: 92.5 fL (ref 80.0–100.0)
Monocytes Absolute: 0.4 10*3/uL (ref 0.1–1.0)
Monocytes Relative: 4 %
Neutro Abs: 7.2 10*3/uL (ref 1.7–7.7)
Neutrophils Relative %: 87 %
Platelets: 246 10*3/uL (ref 150–400)
RBC: 3.45 MIL/uL — ABNORMAL LOW (ref 3.87–5.11)
RDW: 13.6 % (ref 11.5–15.5)
WBC: 8.3 10*3/uL (ref 4.0–10.5)
nRBC: 0 % (ref 0.0–0.2)

## 2022-02-27 LAB — BASIC METABOLIC PANEL
Anion gap: 13 (ref 5–15)
BUN: 74 mg/dL — ABNORMAL HIGH (ref 6–20)
CO2: 22 mmol/L (ref 22–32)
Calcium: 8.5 mg/dL — ABNORMAL LOW (ref 8.9–10.3)
Chloride: 103 mmol/L (ref 98–111)
Creatinine, Ser: 10.85 mg/dL — ABNORMAL HIGH (ref 0.44–1.00)
GFR, Estimated: 5 mL/min — ABNORMAL LOW (ref >60)
Glucose, Bld: 148 mg/dL — ABNORMAL HIGH (ref 70–99)
Potassium: 4 mmol/L (ref 3.5–5.1)
Sodium: 138 mmol/L (ref 135–145)

## 2022-02-27 LAB — I-STAT VENOUS BLOOD GAS, ED
Acid-base deficit: 2 mmol/L (ref 0.0–2.0)
Bicarbonate: 22.4 mmol/L (ref 20.0–28.0)
Calcium, Ion: 1.13 mmol/L — ABNORMAL LOW (ref 1.15–1.40)
HCT: 31 % — ABNORMAL LOW (ref 36.0–46.0)
Hemoglobin: 10.5 g/dL — ABNORMAL LOW (ref 12.0–15.0)
O2 Saturation: 91 %
Patient temperature: 97.5
Potassium: 3.9 mmol/L (ref 3.5–5.1)
Sodium: 138 mmol/L (ref 135–145)
TCO2: 24 mmol/L (ref 22–32)
pCO2, Ven: 35 mmHg — ABNORMAL LOW (ref 44–60)
pH, Ven: 7.412 (ref 7.25–7.43)
pO2, Ven: 57 mmHg — ABNORMAL HIGH (ref 32–45)

## 2022-02-27 LAB — CBG MONITORING, ED: Glucose-Capillary: 154 mg/dL — ABNORMAL HIGH (ref 70–99)

## 2022-02-27 MED ORDER — PROCHLORPERAZINE EDISYLATE 10 MG/2ML IJ SOLN
5.0000 mg | Freq: Once | INTRAMUSCULAR | Status: AC
  Administered 2022-02-27: 5 mg via INTRAVENOUS
  Filled 2022-02-27: qty 2

## 2022-02-27 MED ORDER — LABETALOL HCL 5 MG/ML IV SOLN
20.0000 mg | Freq: Once | INTRAVENOUS | Status: AC
  Administered 2022-02-27: 20 mg via INTRAVENOUS
  Filled 2022-02-27: qty 4

## 2022-02-27 MED ORDER — PANTOPRAZOLE SODIUM 40 MG IV SOLR
40.0000 mg | Freq: Once | INTRAVENOUS | Status: AC
  Administered 2022-02-27: 40 mg via INTRAVENOUS
  Filled 2022-02-27: qty 10

## 2022-02-27 MED ORDER — METOCLOPRAMIDE HCL 5 MG/ML IJ SOLN
10.0000 mg | Freq: Once | INTRAMUSCULAR | Status: AC
  Administered 2022-02-27: 10 mg via INTRAVENOUS
  Filled 2022-02-27: qty 2

## 2022-02-27 MED ORDER — DIPHENHYDRAMINE HCL 50 MG/ML IJ SOLN
25.0000 mg | Freq: Once | INTRAMUSCULAR | Status: AC
  Administered 2022-02-27: 25 mg via INTRAVENOUS
  Filled 2022-02-27: qty 1

## 2022-02-27 NOTE — ED Provider Notes (Signed)
Patient signed out to me is pending repeat evaluation for her nausea and vomiting.  On my exam the patient states her symptoms feel better she is no longer vomiting, she feels ready to go home.  Patient discharged to follow-up with her outpatient physicians this week.  Advised return for worsening symptoms or any additional concerns.   Luna Fuse, MD 02/27/22 [phone]

## 2022-02-27 NOTE — ED Provider Notes (Signed)
[facility] Provider Note: [name], MD, FACEP  CSN: [id number] MRN: [id number] ARRIVAL: 02/27/22 at [place]: [place]  Vomiting   HISTORY OF PRESENT ILLNESS  02/27/22 5:39 AM Kathy Lewis is a 26 y.o. female with type 1 diabetes and end-stage renal disease on peritoneal dialysis.  She was seen in the ED at [facility] yesterday and admitted for persistent nausea and vomiting and borderline diabetic ketoacidosis.  She left AMA from the hospital yesterday evening due to anxiety.  She does not like feeling cooped up in the hospital.  She did perform peritoneal dialysis yesterday evening.  She returns with nausea and vomiting since about midnight this morning.  She is not having significant abdominal pain at this time.   Past Medical History:  Diagnosis Date   Anxiety    Bipolar 2 disorder (HCC)    Chronic systolic (congestive) heart failure (HCC)    Depression    DKA (diabetic ketoacidoses)    ESRD on peritoneal dialysis ([place])    HSV infection    on valtrex   Hypokalemia    Leukocytosis    Migraine    Noncompliance with medication regimen    Preeclampsia    Severe anemia    Type 1 diabetes mellitus ([place])     Past Surgical History:  Procedure Laterality Date   CARDIAC CATHETERIZATION     DILATION AND EVACUATION N/A 10/22/2019   Procedure: ULTRASOUND GUIDED DILATATION AND EVACUATION;  Surgeon: [name], MD;  Location: [facility] LD ORS;  Service: Gynecology;  Laterality: N/A;   NO PAST SURGERIES     peritoneal dialysis catheter insertion     RENAL BIOPSY      Family History  Adopted: Yes  Problem Relation Age of Onset   Heart disease Neg Hx     Social History   Tobacco Use   Smoking status: Former    Packs/day: 1.00    Years: 2.00    Total pack years: 2.00    Types: Cigars, Cigarettes   Smokeless tobacco: Never  Vaping Use   Vaping Use: Never used  Substance Use Topics   Alcohol use: No   Drug use: No    Prior to  Admission medications   Medication Sig Start Date End Date Taking? Authorizing Provider  acetaminophen (TYLENOL) 500 MG tablet Take 1,000 mg by mouth every 6 (six) hours as needed for headache (pain).    [provider]  azelastine (ASTELIN) 0.1 % nasal spray Place 2 sprays into both nostrils 2 (two) times daily. Use in each nostril as directed Patient not taking: Reported on 02/26/2022 12/14/21   Hunsucker, Bonna Gains, MD  butalbital-acetaminophen-caffeine (FIORICET) [phone] MG tablet Take 1 tablet by mouth daily as needed for headache.    [provider]  calcitRIOL (ROCALTROL) 0.25 MCG capsule Take 0.25 mcg by mouth every morning. 08/03/21   [provider]  Calcium Carbonate Antacid (TUMS PO) Take 2 tablets by mouth 3 (three) times daily with meals.    [provider]  carvedilol (COREG) 12.5 MG tablet Take 12.5 mg by mouth 2 (two) times daily with a meal.    [provider]  ferrous sulfate 325 (65 FE) MG tablet Take 325 mg by mouth 3 (three) times daily with meals.    [provider]  fluticasone (FLONASE) 50 MCG/ACT nasal spray Place 1 spray into both nostrils in the morning and at bedtime. Patient not taking: Reported on 02/26/2022 12/14/21   Hunsucker,  Bonna Gains, MD  hydrALAZINE (APRESOLINE) 100 MG tablet Take 100 mg by mouth 3 (three) times daily. 08/05/21   [provider]  Insulin Disposable Pump (OMNIPOD 5 G6 POD, GEN 5,) MISC Inject into the skin continuous. Use with Humalog    [provider]  insulin lispro (HUMALOG) 100 UNIT/ML injection Inject 7 Units into the skin once. 01/08/22   [provider]  Insulin Syringe-Needle U-100 (INSULIN SYRINGE .3CC/31GX5/16") 31G X 5/16" 0.3 ML MISC Use as directed 10/27/19   [name], MD  magnesium oxide (MAG-OX) 400 MG tablet Take 400 mg by mouth daily. 08/03/21   [provider]  metoCLOPramide (REGLAN) 10 MG tablet Take 1 tablet (10 mg total) by mouth every 8  (eight) hours as needed for nausea. 01/17/22   Lucrezia Starch, MD  ondansetron (ZOFRAN) 4 MG tablet Take 4 mg by mouth every 8 (eight) hours as needed for nausea or vomiting.    [provider]  prochlorperazine (COMPAZINE) 5 MG tablet Take 1 tablet (5 mg total) by mouth every 6 (six) hours as needed for nausea or vomiting. 01/21/22   Regan Lemming, MD  sacubitril-valsartan (ENTRESTO) 49-51 MG Take 1 tablet by mouth every morning.    [provider]  Vitamin D, Ergocalciferol, (DRISDOL) 1.25 MG (50000 UNIT) CAPS capsule Take 50,000 Units by mouth every Sunday.    [provider]  escitalopram (LEXAPRO) 10 MG tablet Take 20 mg by mouth daily.  05/10/20 10/06/20  [provider]  furosemide (LASIX) 40 MG tablet Take 1 tablet (40 mg total) by mouth daily. Patient not taking: No sig reported 10/28/19 02/17/21  [name], MD  lisinopril (ZESTRIL) 10 MG tablet Take 10 mg by mouth daily.  02/23/20 10/06/20  [provider]  spironolactone (ALDACTONE) 25 MG tablet Take 25 mg by mouth daily.  04/20/20 10/06/20  [provider]    Allergies Cantaloupe extract allergy skin test, Strawberry extract, Citrullus vulgaris, and Nsaids   REVIEW OF SYSTEMS  Negative except as noted here or in the History of Present Illness.   PHYSICAL EXAMINATION  Initial Vital Signs Blood pressure (!) 161/125, pulse (!) 101, temperature (!) 97.5 F (36.4 C), temperature source Oral, resp. rate 18, height 5' (1.524 m), weight 63 kg, SpO2 100 %.  Examination General: Well-developed, well-nourished female in no acute distress; appearance consistent with age of record HENT: normocephalic; atraumatic Eyes: Normal appearance Neck: supple Heart: regular rate and rhythm Lungs: clear to auscultation bilaterally Abdomen: soft; nondistended; nontender; no masses or hepatosplenomegaly; bowel sounds present Extremities: No deformity; full range of motion; pulses  normal Neurologic: Awake, alert and oriented; motor function intact in all extremities and symmetric; no facial droop Skin: Warm and dry Psychiatric: Normal mood and affect   RESULTS  Summary of this visit's results, reviewed and interpreted by myself:   EKG Interpretation  Date/Time:  Monday February 27 2022 05:47:08 EDT Ventricular Rate:  97 PR Interval:  141 QRS Duration: 89 QT Interval:  432 QTC Calculation: 549 R Axis:   100 Text Interpretation: Sinus rhythm Probable left atrial enlargement Borderline right axis deviation Borderline repolarization abnormality Prolonged QT interval No significant change was found Confirmed by Shanon Rosser [phone]) on 02/27/2022 5:55:28 AM       Laboratory Studies: Results for orders placed or performed during the hospital encounter of 02/27/22 (from the past 24 hour(s))  POC CBG, ED     Status: Abnormal   Collection Time: 02/27/22  5:35 AM  Result  Value Ref Range   Glucose-Capillary 154 (H) 70 - 99 mg/dL  CBC with Differential     Status: Abnormal   Collection Time: 02/27/22  5:42 AM  Result Value Ref Range   WBC 8.3 4.0 - 10.5 K/uL   RBC 3.45 (L) 3.87 - 5.11 MIL/uL   Hemoglobin 10.9 (L) 12.0 - 15.0 g/dL   HCT 31.9 (L) 36.0 - 46.0 %   MCV 92.5 80.0 - 100.0 fL   MCH 31.6 26.0 - 34.0 pg   MCHC 34.2 30.0 - 36.0 g/dL   RDW 13.6 11.5 - 15.5 %   Platelets 246 150 - 400 K/uL   nRBC 0.0 0.0 - 0.2 %   Neutrophils Relative % 87 %   Neutro Abs 7.2 1.7 - 7.7 K/uL   Lymphocytes Relative 8 %   Lymphs Abs 0.7 0.7 - 4.0 K/uL   Monocytes Relative 4 %   Monocytes Absolute 0.4 0.1 - 1.0 K/uL   Eosinophils Relative 0 %   Eosinophils Absolute 0.0 0.0 - 0.5 K/uL   Basophils Relative 0 %   Basophils Absolute 0.0 0.0 - 0.1 K/uL   Immature Granulocytes 1 %   Abs Immature Granulocytes 0.04 0.00 - 0.07 K/uL  Basic metabolic panel     Status: Abnormal   Collection Time: 02/27/22  5:42 AM  Result Value Ref Range   Sodium 138 135 - 145 mmol/L   Potassium 4.0  3.5 - 5.1 mmol/L   Chloride 103 98 - 111 mmol/L   CO2 22 22 - 32 mmol/L   Glucose, Bld 148 (H) 70 - 99 mg/dL   BUN 74 (H) 6 - 20 mg/dL   Creatinine, Ser 10.85 (H) 0.44 - 1.00 mg/dL   Calcium 8.5 (L) 8.9 - 10.3 mg/dL   GFR, Estimated 5 (L) >60 mL/min   Anion gap 13 5 - 15  Magnesium     Status: None   Collection Time: 02/27/22  5:42 AM  Result Value Ref Range   Magnesium 2.3 1.7 - 2.4 mg/dL  I-Stat venous blood gas, ([facility] ED only)     Status: Abnormal   Collection Time: 02/27/22  6:03 AM  Result Value Ref Range   pH, Ven 7.412 7.25 - 7.43   pCO2, Ven 35.0 (L) 44 - 60 mmHg   pO2, Ven 57 (H) 32 - 45 mmHg   Bicarbonate 22.4 20.0 - 28.0 mmol/L   TCO2 24 22 - 32 mmol/L   O2 Saturation 91 %   Acid-base deficit 2.0 0.0 - 2.0 mmol/L   Sodium 138 135 - 145 mmol/L   Potassium 3.9 3.5 - 5.1 mmol/L   Calcium, Ion 1.13 (L) 1.15 - 1.40 mmol/L   HCT 31.0 (L) 36.0 - 46.0 %   Hemoglobin 10.5 (L) 12.0 - 15.0 g/dL   Patient temperature 97.5 F    Collection site IV start    Drawn by Nurse    Sample type VENOUS    Imaging Studies: DG CHEST PORT 1 VIEW  Result Date: 02/26/2022 CLINICAL DATA:  End-stage renal disease. Abdominal pain. Patient is on peritoneal dialysis. EXAM: PORTABLE CHEST 1 VIEW COMPARISON:  One-view abdomen 01/30/2022 FINDINGS: Heart is enlarged. Persistent pericardial effusion suspected. No edema or pleural effusion is present. No focal airspace disease is present. The visualized soft tissues and bony thorax are unremarkable. IMPRESSION: 1. Cardiomegaly without failure. 2. Probable pericardial effusion, chronic. 3. No acute cardiopulmonary disease. Electronically Signed   By: San Morelle M.D.   On: 02/26/2022 12:26  ED COURSE and MDM  Nursing notes, initial and subsequent vitals signs, including pulse oximetry, reviewed and interpreted by myself.  Vitals:   02/27/22 0533 02/27/22 0535 02/27/22 0615  BP:  (!) 161/125 (!) 164/130  Pulse:  (!) 101 (!) 114  Resp:  18 (!)  23  Temp:  (!) 97.5 F (36.4 C)   TempSrc:  Oral   SpO2:  100% 98%  Weight: 63 kg    Height: 5' (1.524 m)     Medications  labetalol (NORMODYNE) injection 20 mg (has no administration in time range)  diphenhydrAMINE (BENADRYL) injection 25 mg (25 mg Intravenous Given 02/27/22 0551)  metoCLOPramide (REGLAN) injection 10 mg (10 mg Intravenous Given 02/27/22 0551)  pantoprazole (PROTONIX) injection 40 mg (40 mg Intravenous Given 02/27/22 0612)  prochlorperazine (COMPAZINE) injection 5 mg (5 mg Intravenous Given 02/27/22 0642)   The patient's sugar is 154.  She is not tachypneic and I do not smell ketones on her breath.  I do not believe she is in DKA at this time but we will check her laboratory studies to evaluate and determine the best treatment course.  Because she is on peritoneal dialysis we will avoid fluid bolus.  6:10 AM Blood gas not consistent with diabetic ketoacidosis.  Patient given Benadryl, Reglan and Protonix for her nausea.  She is now stating she has epigastric pain radiating into her chest.  Her EKG is not showing acute changes.  6:41 AM Patient still nauseated and leaning over emesis bag but has not actually vomited.  She now has hiccups.  Reglan is often helpful for hiccups but apparently did not prevent him in this case.  We will try 5 mg of Compazine.  We have to be ginger with antiemetics due to her known prolonged QT.  She does not know if she thinks she will need to be admitted or not.  As noted above she did leave AMA after being admitted yesterday.  6:58 AM Labetalol 20 mg IV ordered for persistent elevated blood pressure.  Patient likely not taking her regular antihypertensives due to recurrent nausea and vomiting.   7:00 AM Signed out to Dr. Almyra Free.  PROCEDURES  Procedures   ED DIAGNOSES     ICD-10-CM   1. Nausea and vomiting in adult  R11.2          Johnny Gorter, Jenny Reichmann, MD 02/27/22 2237

## 2022-02-27 NOTE — Discharge Instructions (Addendum)
Call your primary care doctor or specialist as discussed in the next 2-3 days.   Return immediately back to the ER if:  Your symptoms worsen within the next 12-24 hours. You develop new symptoms such as new fevers, persistent vomiting, new pain, shortness of breath, or new weakness or numbness, or if you have any other concerns.  

## 2022-02-27 NOTE — ED Triage Notes (Signed)
Pt states N/V that started at 0000 Was admitted on 02/25/2022 for DKA and discharged yesterday

## 2022-02-27 NOTE — ED Notes (Signed)
ED Provider at bedside. 

## 2022-02-27 NOTE — Discharge Summary (Addendum)
Physician Discharge Summary   Patient: Kathy Lewis MRN: [id number] DOB: Aug 31, 1995  Admit date:     02/26/2022  Discharge date: 02/26/22  Discharge Physician: Marylu Lund   PCP: Medicine, [place]   Patient Left Against Medical Advice  Hospital Course: No notes on file  Assessment and Plan: 1. Intractable N/V  - Presents with N/V, a recurring issue for her   - She has normal gastric emptying study on 02/01/22  - Treatment complicated by prolonged QT interval  - Overnight, patient elected to leave against medical advice -Per RN, was not able to advance diet past "3 bites" of dinner   2. Early DKA; type I DM  - Type I diabetic with A1c 9.2% in March 2023 presents with N/V and found to be hyperglycemic with low serum bicarbonate and elevated AG  - She was started on IV insulin infusion with closure of anion gap - transitioned to subq insulin per Diabetic Coordinator recs   3. ESRD  - Does nightly PD  - consulted Nephrology for PD recs, appreciate input -Remained hyperkalemic on most recent check with K of 5.3   4. Chronic systolic CHF  - EF was 53% on TTE in March 2023  - Hold Entresto until potassium normalizes, restrict fluids    5. Prolonged QT interval  - QTc 517 ms in ED  - Continue cardiac monitoring, check magnesium, avoid QT-prolonging medications     6. Hyperkalemia -to cont on PD         Consultants: nephrology Procedures performed:   Disposition: Left AMA Diet recommendation:   DISCHARGE MEDICATION:   Discharge Exam: Filed Weights   02/27/22 0533  Weight: 63 kg   Left AMA   The results of significant diagnostics from this hospitalization (including imaging, microbiology, ancillary and laboratory) are listed below for reference.   Imaging Studies: DG CHEST PORT 1 VIEW  Result Date: 02/26/2022 CLINICAL DATA:  End-stage renal disease. Abdominal pain. Patient is on peritoneal dialysis. EXAM: PORTABLE CHEST 1 VIEW COMPARISON:   One-view abdomen 01/30/2022 FINDINGS: Heart is enlarged. Persistent pericardial effusion suspected. No edema or pleural effusion is present. No focal airspace disease is present. The visualized soft tissues and bony thorax are unremarkable. IMPRESSION: 1. Cardiomegaly without failure. 2. Probable pericardial effusion, chronic. 3. No acute cardiopulmonary disease. Electronically Signed   By: San Morelle M.D.   On: 02/26/2022 12:26   NM GASTRIC EMPTYING  Result Date: 02/01/2022 CLINICAL DATA:  Abdominal pain with vomiting, history of diabetes mellitus, concern for gastroparesis. EXAM: NUCLEAR MEDICINE GASTRIC EMPTYING SCAN TECHNIQUE: After oral ingestion of radiolabeled meal, sequential abdominal images were obtained for 120 minutes. Residual percentage of activity remaining within the stomach was calculated at 60 and 120 minutes. RADIOPHARMACEUTICALS:  2.0 mCi Tc-39m sulfur colloid in standardized meal COMPARISON:  CT abdomen pelvis Jan 21, 2022 FINDINGS: Expected location of the stomach in the left upper quadrant. Ingested meal empties the stomach gradually over the course of the study with 32% retention at 60 min and 8% retention at 120 min (normal retention less than 30% at a 120 min). IMPRESSION: Normal gastric emptying study. Electronically Signed   By: Dahlia Bailiff M.D.   On: 02/01/2022 14:16   DG Chest Portable 1 View  Result Date: 01/30/2022 CLINICAL DATA:  Chest pain EXAM: PORTABLE CHEST 1 VIEW COMPARISON:  01/24/2022 FINDINGS: Cardiomegaly with vascular congestion and probable mild interstitial edema. No pleural effusion or pneumothorax. IMPRESSION: Cardiomegaly with vascular congestion and mild interstitial edema  Electronically Signed   By: Donavan Foil M.D.   On: 01/30/2022 19:35    Microbiology: Results for orders placed or performed during the hospital encounter of 01/30/22  SARS Coronavirus 2 by RT PCR (hospital order, performed in [facility] hospital lab) *cepheid single result  test* Anterior Nasal Swab     Status: None   Collection Time: 01/30/22  7:54 PM   Specimen: Anterior Nasal Swab  Result Value Ref Range Status   SARS Coronavirus 2 by RT PCR NEGATIVE NEGATIVE Final    Comment: (NOTE) SARS-CoV-2 target nucleic acids are NOT DETECTED.  The SARS-CoV-2 RNA is generally detectable in upper and lower respiratory specimens during the acute phase of infection. The lowest concentration of SARS-CoV-2 viral copies this assay can detect is 250 copies / mL. A negative result does not preclude SARS-CoV-2 infection and should not be used as the sole basis for treatment or other patient management decisions.  A negative result may occur with improper specimen collection / handling, submission of specimen other than nasopharyngeal swab, presence of viral mutation(s) within the areas targeted by this assay, and inadequate number of viral copies (<250 copies / mL). A negative result must be combined with clinical observations, patient history, and epidemiological information.  Fact Sheet for Patients:   https://www.patel.info/  Fact Sheet for Healthcare Providers: https://hall.com/  This test is not yet approved or  cleared by the [place] FDA and has been authorized for detection and/or diagnosis of SARS-CoV-2 by FDA under an Emergency Use Authorization (EUA).  This EUA will remain in effect (meaning this test can be used) for the duration of the COVID-19 declaration under Section 564(b)(1) of the Act, 21 U.S.C. section 360bbb-3(b)(1), unless the authorization is terminated or revoked sooner.  Performed at [facility], [address] [place], [place] [address]   MRSA Next Gen by PCR, Nasal     Status: None   Collection Time: 01/31/22  1:50 AM   Specimen: Nasal Mucosa; Nasal Swab  Result Value Ref Range Status   MRSA by PCR Next Gen NOT DETECTED NOT DETECTED Final    Comment: (NOTE) The GeneXpert  MRSA Assay (FDA approved for NASAL specimens only), is one component of a comprehensive MRSA colonization surveillance program. It is not intended to diagnose MRSA infection nor to guide or monitor treatment for MRSA infections. Test performance is not FDA approved in patients less than 62 years old. Performed at [facility], [address], [place], [place] [address]     Labs: CBC: Recent Labs  Lab 02/26/22 0158 02/27/22 0542 02/27/22 0603  WBC 6.1 8.3  --   NEUTROABS  --  7.2  --   HGB 11.8* 10.9* 10.5*  HCT 37.3 31.9* 31.0*  MCV 97.4 92.5  --   PLT 250 246  --    Basic Metabolic Panel: Recent Labs  Lab 02/26/22 0158 02/26/22 0700 02/26/22 0942 02/26/22 1302 02/27/22 0542 02/27/22 0603  NA 136 137 136 138 138 138  K 5.2* 4.7 5.9* 5.3* 4.0 3.9  CL 99 99 103 104 103  --   CO2 17* 16* 18* 19* 22  --   GLUCOSE 329* 252* 189* 169* 148*  --   BUN 72* 78* 76* 78* 74*  --   CREATININE 10.81* 11.13* 10.86* 10.85* 10.85*  --   CALCIUM 8.8* 8.6* 8.5* 8.7* 8.5*  --   MG  --  2.5*  --   --  2.3  --  Liver Function Tests: Recent Labs  Lab 02/26/22 0158  AST 18  ALT 16  ALKPHOS 72  BILITOT 0.8  PROT 6.6  ALBUMIN 3.3*   CBG: Recent Labs  Lab 02/26/22 1156 02/26/22 1317 02/26/22 1456 02/26/22 1542 02/27/22 0535  GLUCAP 175* 146* 184* 210* 154*     Signed: Marylu Lund, MD [facility] 02/27/2022

## 2022-02-27 NOTE — Discharge Summary (Deleted)
  The note originally documented on this encounter has been moved the the encounter in which it belongs.  

## 2022-03-11 ENCOUNTER — Emergency Department (HOSPITAL_BASED_OUTPATIENT_CLINIC_OR_DEPARTMENT_OTHER)
Admission: EM | Admit: 2022-03-11 | Discharge: 2022-03-11 | Disposition: A | Discharge status: Home / Self Care | Payer: Medicaid Other | Attending: Emergency Medicine | Admitting: Emergency Medicine

## 2022-03-11 ENCOUNTER — Encounter (HOSPITAL_BASED_OUTPATIENT_CLINIC_OR_DEPARTMENT_OTHER): Payer: Self-pay | Admitting: Urology

## 2022-03-11 DIAGNOSIS — Z794 Long term (current) use of insulin: Secondary | ICD-10-CM | POA: Diagnosis not present

## 2022-03-11 DIAGNOSIS — Z79899 Other long term (current) drug therapy: Secondary | ICD-10-CM | POA: Diagnosis not present

## 2022-03-11 DIAGNOSIS — R000 Tachycardia, unspecified: Secondary | ICD-10-CM | POA: Diagnosis not present

## 2022-03-11 DIAGNOSIS — Z992 Dependence on renal dialysis: Secondary | ICD-10-CM | POA: Insufficient documentation

## 2022-03-11 DIAGNOSIS — N9489 Other specified conditions associated with female genital organs and menstrual cycle: Secondary | ICD-10-CM | POA: Diagnosis not present

## 2022-03-11 DIAGNOSIS — R112 Nausea with vomiting, unspecified: Secondary | ICD-10-CM | POA: Diagnosis present

## 2022-03-11 DIAGNOSIS — N186 End stage renal disease: Secondary | ICD-10-CM | POA: Diagnosis not present

## 2022-03-11 DIAGNOSIS — E1122 Type 2 diabetes mellitus with diabetic chronic kidney disease: Secondary | ICD-10-CM | POA: Insufficient documentation

## 2022-03-11 LAB — CBC WITH DIFFERENTIAL/PLATELET
Abs Immature Granulocytes: 0.02 10*3/uL (ref 0.00–0.07)
Basophils Absolute: 0 10*3/uL (ref 0.0–0.1)
Basophils Relative: 0 %
Eosinophils Absolute: 0 10*3/uL (ref 0.0–0.5)
Eosinophils Relative: 0 %
HCT: 36.9 % (ref 36.0–46.0)
Hemoglobin: 12.1 g/dL (ref 12.0–15.0)
Immature Granulocytes: 0 %
Lymphocytes Relative: 8 %
Lymphs Abs: 0.4 10*3/uL — ABNORMAL LOW (ref 0.7–4.0)
MCH: 31.6 pg (ref 26.0–34.0)
MCHC: 32.8 g/dL (ref 30.0–36.0)
MCV: 96.3 fL (ref 80.0–100.0)
Monocytes Absolute: 0.1 10*3/uL (ref 0.1–1.0)
Monocytes Relative: 2 %
Neutro Abs: 4.3 10*3/uL (ref 1.7–7.7)
Neutrophils Relative %: 90 %
Platelets: 230 10*3/uL (ref 150–400)
RBC: 3.83 MIL/uL — ABNORMAL LOW (ref 3.87–5.11)
RDW: 14.4 % (ref 11.5–15.5)
WBC: 4.8 10*3/uL (ref 4.0–10.5)
nRBC: 0 % (ref 0.0–0.2)

## 2022-03-11 LAB — COMPREHENSIVE METABOLIC PANEL
ALT: 17 U/L (ref 0–44)
AST: 20 U/L (ref 15–41)
Albumin: 2.8 g/dL — ABNORMAL LOW (ref 3.5–5.0)
Alkaline Phosphatase: 62 U/L (ref 38–126)
Anion gap: 9 (ref 5–15)
BUN: 68 mg/dL — ABNORMAL HIGH (ref 6–20)
CO2: 25 mmol/L (ref 22–32)
Calcium: 8.1 mg/dL — ABNORMAL LOW (ref 8.9–10.3)
Chloride: 106 mmol/L (ref 98–111)
Creatinine, Ser: 9.36 mg/dL — ABNORMAL HIGH (ref 0.44–1.00)
GFR, Estimated: 5 mL/min — ABNORMAL LOW (ref >60)
Glucose, Bld: 167 mg/dL — ABNORMAL HIGH (ref 70–99)
Potassium: 4.6 mmol/L (ref 3.5–5.1)
Sodium: 140 mmol/L (ref 135–145)
Total Bilirubin: 0.7 mg/dL (ref 0.3–1.2)
Total Protein: 5.9 g/dL — ABNORMAL LOW (ref 6.5–8.1)

## 2022-03-11 LAB — HCG, SERUM, QUALITATIVE: Preg, Serum: NEGATIVE

## 2022-03-11 LAB — CBG MONITORING, ED: Glucose-Capillary: 158 mg/dL — ABNORMAL HIGH (ref 70–99)

## 2022-03-11 MED ORDER — CARVEDILOL 12.5 MG PO TABS
12.5000 mg | ORAL_TABLET | Freq: Two times a day (BID) | ORAL | Status: DC
  Administered 2022-03-11: 12.5 mg via ORAL
  Filled 2022-03-11: qty 1

## 2022-03-11 MED ORDER — METOCLOPRAMIDE HCL 5 MG/ML IJ SOLN
10.0000 mg | Freq: Once | INTRAMUSCULAR | Status: AC
  Administered 2022-03-11: 10 mg via INTRAVENOUS
  Filled 2022-03-11: qty 2

## 2022-03-11 MED ORDER — DIPHENHYDRAMINE HCL 50 MG/ML IJ SOLN
12.5000 mg | Freq: Once | INTRAMUSCULAR | Status: AC
  Administered 2022-03-11: 12.5 mg via INTRAVENOUS
  Filled 2022-03-11: qty 1

## 2022-03-11 NOTE — ED Triage Notes (Signed)
Vomiting since 1400, hx of DM and Peritoneal dialysis  States normal BS levels on Dexcom today  Denies pain

## 2022-03-11 NOTE — ED Provider Notes (Signed)
[facility] EMERGENCY DEPARTMENT Provider Note   CSN: [id number] Arrival date & time: 03/11/22  6269     History  Chief Complaint  Patient presents with   Emesis    Kathy Lewis is a 26 y.o. female.  The history is provided by the patient and medical records.  Emesis Kathy Lewis is a 26 y.o. female who presents to the Emergency Department complaining of vomiting.  She has a history of insulin-dependent diabetes, uses an insulin pump as well as ESRD on peritoneal dialysis at home here for evaluation of vomiting that started at 2 PM with associated diarrhea.  No associated abdominal pain or fever.  No bad food exposures.  She has had similar episodes in the past but never episodes of the felt this bad.  She does not feel bloated.  She did do her dialysis at home earlier this evening due to feeling unwell.  No change in the dialysate fluid or pain with dialysis.  She did vomit her home Coreg this evening        Home Medications Prior to Admission medications   Medication Sig Start Date End Date Taking? Authorizing Provider  acetaminophen (TYLENOL) 500 MG tablet Take 1,000 mg by mouth every 6 (six) hours as needed for headache (pain).    [provider]  azelastine (ASTELIN) 0.1 % nasal spray Place 2 sprays into both nostrils 2 (two) times daily. Use in each nostril as directed Patient not taking: Reported on 02/26/2022 12/14/21   Hunsucker, Bonna Gains, MD  butalbital-acetaminophen-caffeine (FIORICET) [phone] MG tablet Take 1 tablet by mouth daily as needed for headache.    [provider]  calcitRIOL (ROCALTROL) 0.25 MCG capsule Take 0.25 mcg by mouth every morning. 08/03/21   [provider]  Calcium Carbonate Antacid (TUMS PO) Take 2 tablets by mouth 3 (three) times daily with meals.    [provider]  carvedilol (COREG) 12.5 MG tablet Take 12.5 mg by mouth 2 (two) times daily with a meal.    [provider]  ferrous sulfate  325 (65 FE) MG tablet Take 325 mg by mouth 3 (three) times daily with meals.    [provider]  fluticasone (FLONASE) 50 MCG/ACT nasal spray Place 1 spray into both nostrils in the morning and at bedtime. Patient not taking: Reported on 02/26/2022 12/14/21   Hunsucker, Bonna Gains, MD  hydrALAZINE (APRESOLINE) 100 MG tablet Take 100 mg by mouth 3 (three) times daily. 08/05/21   [provider]  Insulin Disposable Pump (OMNIPOD 5 G6 POD, GEN 5,) MISC Inject into the skin continuous. Use with Humalog    [provider]  insulin lispro (HUMALOG) 100 UNIT/ML injection Inject 7 Units into the skin once. 01/08/22   [provider]  Insulin Syringe-Needle U-100 (INSULIN SYRINGE .3CC/31GX5/16") 31G X 5/16" 0.3 ML MISC Use as directed 10/27/19   [name], MD  magnesium oxide (MAG-OX) 400 MG tablet Take 400 mg by mouth daily. 08/03/21   [provider]  metoCLOPramide (REGLAN) 10 MG tablet Take 1 tablet (10 mg total) by mouth every 8 (eight) hours as needed for nausea. 01/17/22   Lucrezia Starch, MD  ondansetron (ZOFRAN) 4 MG tablet Take 4 mg by mouth every 8 (eight) hours as needed for nausea or vomiting.    [provider]  prochlorperazine (COMPAZINE) 5 MG tablet Take 1 tablet (5 mg total) by mouth every 6 (six) hours as needed for nausea or vomiting. 01/21/22   Regan Lemming,  MD  sacubitril-valsartan (ENTRESTO) 49-51 MG Take 1 tablet by mouth every morning.    [provider]  Vitamin D, Ergocalciferol, (DRISDOL) 1.25 MG (50000 UNIT) CAPS capsule Take 50,000 Units by mouth every Sunday.    [provider]  escitalopram (LEXAPRO) 10 MG tablet Take 20 mg by mouth daily.  05/10/20 10/06/20  [provider]  furosemide (LASIX) 40 MG tablet Take 1 tablet (40 mg total) by mouth daily. Patient not taking: No sig reported 10/28/19 02/17/21  [name], MD  lisinopril (ZESTRIL) 10 MG tablet Take 10 mg by mouth daily.  02/23/20 10/06/20   [provider]  spironolactone (ALDACTONE) 25 MG tablet Take 25 mg by mouth daily.  04/20/20 10/06/20  [provider]      Allergies    Cantaloupe extract allergy skin test, Strawberry extract, Citrullus vulgaris, and Nsaids    Review of Systems   Review of Systems  Gastrointestinal:  Positive for vomiting.  All other systems reviewed and are negative.   Physical Exam Updated Vital Signs BP (!) 162/101   Pulse (!) 115   Temp 98.7 F (37.1 C) (Oral)   Resp 17   Ht 5' (1.524 m)   Wt 65.3 kg   SpO2 98%   BMI 28.12 kg/m  Physical Exam Vitals and nursing note reviewed.  Constitutional:      Appearance: She is well-developed.  HENT:     Head: Normocephalic and atraumatic.  Cardiovascular:     Rate and Rhythm: Regular rhythm. Tachycardia present.     Heart sounds: No murmur heard. Pulmonary:     Effort: Pulmonary effort is normal. No respiratory distress.     Breath sounds: Normal breath sounds.  Abdominal:     Palpations: Abdomen is soft.     Tenderness: There is no abdominal tenderness. There is no guarding or rebound.     Comments: PD catheter in the right lower quadrant with insulin pump nearby.  There is no surrounding erythema or tenderness.  There is small fluid wave in the abdomen.  Musculoskeletal:        General: No tenderness.     Comments: 2+ DP pulses bilaterally.  Feet are clean and without any ulcerations or erythema.  Skin:    General: Skin is warm and dry.  Neurological:     Mental Status: She is alert and oriented to person, place, and time.  Psychiatric:        Behavior: Behavior normal.     ED Results / Procedures / Treatments   Labs (all labs ordered are listed, but only abnormal results are displayed) Labs Reviewed  COMPREHENSIVE METABOLIC PANEL - Abnormal; Notable for the following components:      Result Value   Glucose, Bld 167 (*)    BUN 68 (*)    Creatinine, Ser 9.36 (*)    Calcium 8.1 (*)    Total Protein 5.9 (*)     Albumin 2.8 (*)    GFR, Estimated 5 (*)    All other components within normal limits  CBC WITH DIFFERENTIAL/PLATELET - Abnormal; Notable for the following components:   RBC 3.83 (*)    Lymphs Abs 0.4 (*)    All other components within normal limits  CBG MONITORING, ED - Abnormal; Notable for the following components:   Glucose-Capillary 158 (*)    All other components within normal limits  HCG, SERUM, QUALITATIVE    EKG EKG Interpretation  Date/Time:  Saturday March 11 2022 03:33:31 EDT Ventricular  Rate:  113 PR Interval:  136 QRS Duration: 93 QT Interval:  373 QTC Calculation: 512 R Axis:   -18 Text Interpretation: Sinus tachycardia Probable left atrial enlargement Borderline left axis deviation Prolonged QT interval Confirmed by Quintella Reichert [phone]) on 03/11/2022 3:35:07 AM  Radiology No results found.  Procedures Procedures    Medications Ordered in ED Medications  carvedilol (COREG) tablet 12.5 mg (12.5 mg Oral Given 03/11/22 0540)  metoCLOPramide (REGLAN) injection 10 mg (10 mg Intravenous Given 03/11/22 0358)  diphenhydrAMINE (BENADRYL) injection 12.5 mg (12.5 mg Intravenous Given 03/11/22 0401)    ED Course/ Medical Decision Making/ A&P                           Medical Decision Making Amount and/or Complexity of Data Reviewed Labs: ordered.  Risk Prescription drug management.   Patient with history of insulin-dependent diabetes, ESRD on PD here for evaluation of vomiting, diarrhea.  Patient tachycardic on ED presentation.  She has no significant abdominal tenderness.  Labs are appropriate given her renal disease.  No significant electrolyte abnormality.  No evidence of DKA.  She was treated with antiemetic in the emergency department.  On repeat evaluation her nausea and vomiting has resolved and she is able to tolerate oral fluids.  She does remain tachycardic but did vomit her carvedilol.  On reassessment patient feels improved and wishes to be discharged  home.  She declines prescriptions for antiemetics-states she has these available at home.        Final Clinical Impression(s) / ED Diagnoses Final diagnoses:  Nausea and vomiting, unspecified vomiting type  ESRD on peritoneal dialysis [facility])    Rx / DC Orders ED Discharge Orders     None         Quintella Reichert, MD 03/11/22 [phone]

## 2022-03-12 ENCOUNTER — Encounter (HOSPITAL_BASED_OUTPATIENT_CLINIC_OR_DEPARTMENT_OTHER): Payer: Self-pay | Admitting: Emergency Medicine

## 2022-03-12 ENCOUNTER — Other Ambulatory Visit: Payer: Self-pay

## 2022-03-12 ENCOUNTER — Emergency Department (HOSPITAL_COMMUNITY)
Admission: EM | Admit: 2022-03-12 | Discharge: 2022-03-12 | Discharge status: Against Medical Advice | Payer: Medicaid Other | Attending: Emergency Medicine | Admitting: Emergency Medicine

## 2022-03-12 ENCOUNTER — Emergency Department (HOSPITAL_BASED_OUTPATIENT_CLINIC_OR_DEPARTMENT_OTHER)
Admission: EM | Admit: 2022-03-12 | Discharge: 2022-03-12 | Disposition: A | Discharge status: Home / Self Care | Payer: Medicaid Other | Source: Home / Self Care | Attending: Emergency Medicine | Admitting: Emergency Medicine

## 2022-03-12 ENCOUNTER — Encounter (HOSPITAL_COMMUNITY): Payer: Self-pay

## 2022-03-12 DIAGNOSIS — Z5321 Procedure and treatment not carried out due to patient leaving prior to being seen by health care provider: Secondary | ICD-10-CM | POA: Diagnosis not present

## 2022-03-12 DIAGNOSIS — E1022 Type 1 diabetes mellitus with diabetic chronic kidney disease: Secondary | ICD-10-CM | POA: Insufficient documentation

## 2022-03-12 DIAGNOSIS — I132 Hypertensive heart and chronic kidney disease with heart failure and with stage 5 chronic kidney disease, or end stage renal disease: Secondary | ICD-10-CM | POA: Insufficient documentation

## 2022-03-12 DIAGNOSIS — R112 Nausea with vomiting, unspecified: Secondary | ICD-10-CM

## 2022-03-12 DIAGNOSIS — R739 Hyperglycemia, unspecified: Secondary | ICD-10-CM

## 2022-03-12 DIAGNOSIS — Z79899 Other long term (current) drug therapy: Secondary | ICD-10-CM | POA: Insufficient documentation

## 2022-03-12 DIAGNOSIS — R1011 Right upper quadrant pain: Secondary | ICD-10-CM | POA: Diagnosis present

## 2022-03-12 DIAGNOSIS — E1065 Type 1 diabetes mellitus with hyperglycemia: Secondary | ICD-10-CM | POA: Insufficient documentation

## 2022-03-12 DIAGNOSIS — Z992 Dependence on renal dialysis: Secondary | ICD-10-CM | POA: Insufficient documentation

## 2022-03-12 DIAGNOSIS — Z794 Long term (current) use of insulin: Secondary | ICD-10-CM | POA: Insufficient documentation

## 2022-03-12 DIAGNOSIS — I5022 Chronic systolic (congestive) heart failure: Secondary | ICD-10-CM | POA: Insufficient documentation

## 2022-03-12 DIAGNOSIS — N186 End stage renal disease: Secondary | ICD-10-CM | POA: Insufficient documentation

## 2022-03-12 DIAGNOSIS — R1110 Vomiting, unspecified: Secondary | ICD-10-CM | POA: Insufficient documentation

## 2022-03-12 LAB — CBC WITH DIFFERENTIAL/PLATELET
Abs Immature Granulocytes: 0.02 10*3/uL (ref 0.00–0.07)
Basophils Absolute: 0.1 10*3/uL (ref 0.0–0.1)
Basophils Relative: 1 %
Eosinophils Absolute: 0.1 10*3/uL (ref 0.0–0.5)
Eosinophils Relative: 2 %
HCT: 38.4 % (ref 36.0–46.0)
Hemoglobin: 12.7 g/dL (ref 12.0–15.0)
Immature Granulocytes: 0 %
Lymphocytes Relative: 14 %
Lymphs Abs: 0.7 10*3/uL (ref 0.7–4.0)
MCH: 31.5 pg (ref 26.0–34.0)
MCHC: 33.1 g/dL (ref 30.0–36.0)
MCV: 95.3 fL (ref 80.0–100.0)
Monocytes Absolute: 0.2 10*3/uL (ref 0.1–1.0)
Monocytes Relative: 5 %
Neutro Abs: 4.2 10*3/uL (ref 1.7–7.7)
Neutrophils Relative %: 78 %
Platelets: 268 10*3/uL (ref 150–400)
RBC: 4.03 MIL/uL (ref 3.87–5.11)
RDW: 14.3 % (ref 11.5–15.5)
WBC: 5.4 10*3/uL (ref 4.0–10.5)
nRBC: 0 % (ref 0.0–0.2)

## 2022-03-12 LAB — COMPREHENSIVE METABOLIC PANEL
ALT: 11 U/L (ref 0–44)
AST: 18 U/L (ref 15–41)
Albumin: 3 g/dL — ABNORMAL LOW (ref 3.5–5.0)
Alkaline Phosphatase: 68 U/L (ref 38–126)
Anion gap: 19 — ABNORMAL HIGH (ref 5–15)
BUN: 67 mg/dL — ABNORMAL HIGH (ref 6–20)
CO2: 22 mmol/L (ref 22–32)
Calcium: 9 mg/dL (ref 8.9–10.3)
Chloride: 100 mmol/L (ref 98–111)
Creatinine, Ser: 9.8 mg/dL — ABNORMAL HIGH (ref 0.44–1.00)
GFR, Estimated: 5 mL/min — ABNORMAL LOW (ref >60)
Glucose, Bld: 194 mg/dL — ABNORMAL HIGH (ref 70–99)
Potassium: 4.7 mmol/L (ref 3.5–5.1)
Sodium: 141 mmol/L (ref 135–145)
Total Bilirubin: 0.9 mg/dL (ref 0.3–1.2)
Total Protein: 6.3 g/dL — ABNORMAL LOW (ref 6.5–8.1)

## 2022-03-12 LAB — BASIC METABOLIC PANEL
Anion gap: 12 (ref 5–15)
BUN: 69 mg/dL — ABNORMAL HIGH (ref 6–20)
CO2: 22 mmol/L (ref 22–32)
Calcium: 8.7 mg/dL — ABNORMAL LOW (ref 8.9–10.3)
Chloride: 104 mmol/L (ref 98–111)
Creatinine, Ser: 9.78 mg/dL — ABNORMAL HIGH (ref 0.44–1.00)
GFR, Estimated: 5 mL/min — ABNORMAL LOW (ref >60)
Glucose, Bld: 206 mg/dL — ABNORMAL HIGH (ref 70–99)
Potassium: 4.8 mmol/L (ref 3.5–5.1)
Sodium: 138 mmol/L (ref 135–145)

## 2022-03-12 LAB — LIPASE, BLOOD: Lipase: 20 U/L (ref 11–51)

## 2022-03-12 LAB — CBG MONITORING, ED: Glucose-Capillary: 208 mg/dL — ABNORMAL HIGH (ref 70–99)

## 2022-03-12 MED ORDER — LABETALOL HCL 5 MG/ML IV SOLN
10.0000 mg | Freq: Once | INTRAVENOUS | Status: AC
  Administered 2022-03-12: 10 mg via INTRAVENOUS
  Filled 2022-03-12: qty 4

## 2022-03-12 MED ORDER — METOCLOPRAMIDE HCL 5 MG/ML IJ SOLN
10.0000 mg | Freq: Once | INTRAMUSCULAR | Status: AC
  Administered 2022-03-12: 10 mg via INTRAVENOUS
  Filled 2022-03-12: qty 2

## 2022-03-12 MED ORDER — PROCHLORPERAZINE EDISYLATE 10 MG/2ML IJ SOLN
5.0000 mg | Freq: Once | INTRAMUSCULAR | Status: AC
  Administered 2022-03-12: 5 mg via INTRAVENOUS
  Filled 2022-03-12: qty 2

## 2022-03-12 MED ORDER — DIPHENHYDRAMINE HCL 50 MG/ML IJ SOLN
25.0000 mg | Freq: Once | INTRAMUSCULAR | Status: AC
  Administered 2022-03-12: 25 mg via INTRAVENOUS
  Filled 2022-03-12: qty 1

## 2022-03-12 NOTE — ED Notes (Addendum)
Pt's sats noted to be 75%, sleepy after medications. Placed on 2LNC, arousable to voice, oriented x 4. Sats recovered to 96% with [facility]

## 2022-03-12 NOTE — ED Provider Notes (Signed)
[facility]GENCY DEPARTMENT Provider Note   CSN: [id number] Arrival date & time: 03/12/22  0402     History  Chief Complaint  Patient presents with   Abdominal Pain   Emesis    Kathy Lewis is a 26 y.o. female.   Abdominal Pain Associated symptoms: vomiting   Associated symptoms: no fever   Emesis Associated symptoms: abdominal pain   Associated symptoms: no fever   Patient with history of CHF, diabetes, recurrent nausea vomiting presents with episode of upper abdominal pain and vomiting.  This been ongoing for several hours.  She reports multiple episodes of nonbloody emesis.  She also reports diarrhea.  She reports upper abdominal pain.  This is similar to prior episodes.  She reports the nausea is causing chest pain and shortness of breath.  She was unable to perform home peritoneal dialysis.  She has been able to take her home blood pressure medicine several hours ago.    Past Medical History:  Diagnosis Date   Anxiety    Bipolar 2 disorder (HCC)    Chronic systolic (congestive) heart failure (HCC)    Depression    DKA (diabetic ketoacidoses)    ESRD on peritoneal dialysis ([place])    HSV infection    on valtrex   Hypokalemia    Leukocytosis    Migraine    Noncompliance with medication regimen    Preeclampsia    Severe anemia    Type 1 diabetes mellitus ([place])     Home Medications Prior to Admission medications   Medication Sig Start Date End Date Taking? Authorizing Provider  acetaminophen (TYLENOL) 500 MG tablet Take 1,000 mg by mouth every 6 (six) hours as needed for headache (pain).    [provider]  azelastine (ASTELIN) 0.1 % nasal spray Place 2 sprays into both nostrils 2 (two) times daily. Use in each nostril as directed Patient not taking: Reported on 02/26/2022 12/14/21   Hunsucker, Bonna Gains, MD  butalbital-acetaminophen-caffeine (FIORICET) [phone] MG tablet Take 1 tablet by mouth daily as needed for headache.    [provider]  calcitRIOL (ROCALTROL) 0.25 MCG capsule Take 0.25 mcg by mouth every morning. 08/03/21   [provider]  Calcium Carbonate Antacid (TUMS PO) Take 2 tablets by mouth 3 (three) times daily with meals.    [provider]  carvedilol (COREG) 12.5 MG tablet Take 12.5 mg by mouth 2 (two) times daily with a meal.    [provider]  ferrous sulfate 325 (65 FE) MG tablet Take 325 mg by mouth 3 (three) times daily with meals.    [provider]  fluticasone (FLONASE) 50 MCG/ACT nasal spray Place 1 spray into both nostrils in the morning and at bedtime. Patient not taking: Reported on 02/26/2022 12/14/21   Hunsucker, Bonna Gains, MD  hydrALAZINE (APRESOLINE) 100 MG tablet Take 100 mg by mouth 3 (three) times daily. 08/05/21   [provider]  Insulin Disposable Pump (OMNIPOD 5 G6 POD, GEN 5,) MISC Inject into the skin continuous. Use with Humalog    [provider]  insulin lispro (HUMALOG) 100 UNIT/ML injection Inject 7 Units into the skin once. 01/08/22   [provider]  Insulin Syringe-Needle U-100 (INSULIN SYRINGE .3CC/31GX5/16") 31G X 5/16" 0.3 ML MISC Use as directed 10/27/19   [name], MD  magnesium oxide (MAG-OX) 400 MG tablet Take 400 mg by mouth daily. 08/03/21   [provider]  metoCLOPramide (REGLAN) 10 MG tablet Take 1 tablet (10  mg total) by mouth every 8 (eight) hours as needed for nausea. 01/17/22   Lucrezia Starch, MD  ondansetron (ZOFRAN) 4 MG tablet Take 4 mg by mouth every 8 (eight) hours as needed for nausea or vomiting.    [provider]  prochlorperazine (COMPAZINE) 5 MG tablet Take 1 tablet (5 mg total) by mouth every 6 (six) hours as needed for nausea or vomiting. 01/21/22   Regan Lemming, MD  sacubitril-valsartan (ENTRESTO) 49-51 MG Take 1 tablet by mouth every morning.    [provider]  Vitamin D, Ergocalciferol, (DRISDOL) 1.25 MG (50000 UNIT) CAPS capsule Take 50,000 Units  by mouth every Sunday.    [provider]  escitalopram (LEXAPRO) 10 MG tablet Take 20 mg by mouth daily.  05/10/20 10/06/20  [provider]  furosemide (LASIX) 40 MG tablet Take 1 tablet (40 mg total) by mouth daily. Patient not taking: No sig reported 10/28/19 02/17/21  [name], MD  lisinopril (ZESTRIL) 10 MG tablet Take 10 mg by mouth daily.  02/23/20 10/06/20  [provider]  spironolactone (ALDACTONE) 25 MG tablet Take 25 mg by mouth daily.  04/20/20 10/06/20  [provider]      Allergies    Cantaloupe extract allergy skin test, Strawberry extract, Citrullus vulgaris, and Nsaids    Review of Systems   Review of Systems  Constitutional:  Negative for fever.  Gastrointestinal:  Positive for abdominal pain and vomiting.    Physical Exam Updated Vital Signs BP (!) 165/133   Pulse (!) 110   Temp (!) 97.3 F (36.3 C) (Oral)   Resp 20   Wt 65.3 kg   SpO2 97%   BMI 28.12 kg/m  Physical Exam CONSTITUTIONAL: Chronically ill-appearing, tachypneic HEAD: Normocephalic/atraumatic EYES: EOMI/PERRL ENMT: Mucous membranes moist NECK: supple no meningeal signs SPINE/BACK:entire spine nontender CV: S1/S2 noted, tachycardic LUNGS: Lungs are clear to auscultation bilaterally, no apparent distress ABDOMEN: soft, mild epigastric tenderness, no rebound or guarding, bowel sounds noted throughout abdomen Insulin pump was noted in the right abdomen. PD catheter noted no erythema or drainage GU:no cva tenderness NEURO: Pt is awake/alert/appropriate, moves all extremitiesx4.  No facial droop.   EXTREMITIES: pulses normal/equal, full ROM SKIN: warm, color normal PSYCH: Anxious  ED Results / Procedures / Treatments   Labs (all labs ordered are listed, but only abnormal results are displayed) Labs Reviewed  BASIC METABOLIC PANEL - Abnormal; Notable for the following components:      Result Value   Glucose, Bld 206 (*)    BUN 69 (*)    Creatinine, Ser  9.78 (*)    Calcium 8.7 (*)    GFR, Estimated 5 (*)    All other components within normal limits  CBG MONITORING, ED - Abnormal; Notable for the following components:   Glucose-Capillary 208 (*)    All other components within normal limits    EKG EKG Interpretation  Date/Time:  Sunday March 12 2022 04:24:09 EDT Ventricular Rate:  110 PR Interval:  134 QRS Duration: 93 QT Interval:  381 QTC Calculation: 516 R Axis:   -57 Text Interpretation: Sinus tachycardia Left atrial enlargement Left anterior fascicular block Borderline low voltage, extremity leads Prolonged QT interval No significant change since last tracing Confirmed by Ripley Fraise [phone]) on 03/12/2022 4:36:28 AM  Radiology No results found.  Procedures Procedures    Medications Ordered in ED Medications  metoCLOPramide (REGLAN) injection 10 mg (10 mg Intravenous Given 03/12/22 0443)  diphenhydrAMINE (BENADRYL) injection 25 mg (25  mg Intravenous Given 03/12/22 0442)  labetalol (NORMODYNE) injection 10 mg (10 mg Intravenous Given 03/12/22 1884)  prochlorperazine (COMPAZINE) injection 5 mg (5 mg Intravenous Given 03/12/22 1660)    ED Course/ Medical Decision Making/ A&P Clinical Course as of 03/12/22 6301  Nancy Fetter Mar 12, 2022  0439 Patient with long history of frequent ED visits and admissions.  She will have episodes of recurrent nausea and vomiting at times will have DKA.  Recent gastric emptying study was normal.  We will start with symptom control.  We will also check electrolytes. [DW]  0506 Glucose(!): 206 Hyperglycemia, chronic renal failure [DW]  6010 Patient is nausea is improved.  She has no focal abdominal tenderness.  She did have a drop in her pulse ox briefly, but that is corrected.  No signs of distress at this time. [DW]  [phone] Patient reports she might have vomited up her blood pressure medicines.  We will give a dose of labetalol here. [DW]  [phone] Patient would have significant benefit if she was able to  dialyze.  Since there is no signs of DKA, and her vomiting is improving, she will be safe for discharge.  She can then dialyze at home [DW]  0630 Patient is agreeable with plan [DW]  0631 Vital signs are similar to previous ER visits.  Patient typically has tachycardia in the low 100s and is typically hypertensive.  Low suspicion for hypertensive emergency at this time [DW]  0654 Patient feeling improved, no vomiting, will be discharged home.  Heart rate is improved [DW]    Clinical Course User Index [DW] Ripley Fraise, MD                           Medical Decision Making Amount and/or Complexity of Data Reviewed Labs: ordered. Decision-making details documented in ED Course. ECG/medicine tests: ordered.  Risk Prescription drug management.   This patient presents to the ED for concern of vomiting, this involves an extensive number of treatment options, and is a complaint that carries with it a high risk of complications and morbidity.  The differential diagnosis includes but is not limited to acute coronary syndrome, DKA, worsening renal failure, metabolic disturbance, bowel obstruction, gastritis  Comorbidities that complicate the patient evaluation: Patient's presentation is complicated by their history of end-stage renal disease, CHF  Social Determinants of Health: Patient's  nonadherence to medical treatment   increases the complexity of managing their presentation  Additional history obtained: Records reviewed previous admission documents  Lab Tests: I Ordered, and personally interpreted labs.  The pertinent results include: Hyperglycemia, no signs of DKA   Cardiac Monitoring: The patient was maintained on a cardiac monitor.  I personally viewed and interpreted the cardiac monitor which showed an underlying rhythm of:  sinus tachycardia  Medicines ordered and prescription drug management: I ordered medication including Reglan and Benadryl for nausea Reevaluation of the  patient after these medicines showed that the patient    improved  Test Considered: Considered abdominal imaging but the patient is improving and has had multiple evaluations in the past   Reevaluation: After the interventions noted above, I reevaluated the patient and found that they have :improved  Complexity of problems addressed: Patient's presentation is most consistent with  acute presentation with potential threat to life or bodily function  Disposition: After consideration of the diagnostic results and the patient's response to treatment,  I feel that the patent would benefit from discharge   .  Final Clinical Impression(s) / ED Diagnoses Final diagnoses:  Nausea and vomiting, unspecified vomiting type  ESRD (end stage renal disease) ([place])  Hyperglycemia    Rx / DC Orders ED Discharge Orders     None         Ripley Fraise, MD 03/12/22 [phone]

## 2022-03-12 NOTE — ED Notes (Signed)
Patient verbalizes understanding of discharge instructions. Opportunity for questioning and answers were provided. Armband removed by staff, pt discharged from ED. Ambulated out to lobby  

## 2022-03-12 NOTE — ED Notes (Signed)
O2 removed, sats maintaining >95% RA

## 2022-03-12 NOTE — ED Provider Triage Note (Signed)
Emergency Medicine Provider Triage Evaluation Note  Kathy Lewis , a 26 y.o. female  was evaluated in triage.  Pt complains of persistent vomiting onset 2 days ago at 1400. Has tried Reglan w/o relief. Symptoms associated with RUQ abdominal pain. Unable to perform nightly PD due to ongoing symptoms. Last BM at 1700. No melena, hematochezia, hematemesis.  Review of Systems  Positive: As above Negative: As above  Physical Exam  BP (!) 155/114   Pulse (!) 110   Temp 98 F (36.7 C) (Oral)   Resp 16   SpO2 100%  Gen:   Awake, no distress   Resp:  Normal effort  MSK:   Moves extremities without difficulty  Other:  No focal abdominal TTP. No guarding or peritoneal signs.  Medical Decision Making  Medically screening exam initiated at 2:47 AM.  Appropriate orders placed.  Kathy Lewis was informed that the remainder of the evaluation will be completed by another provider, this initial triage assessment does not replace that evaluation, and the importance of remaining in the ED until their evaluation is complete.  Ongoing N/V - seen frequently for same. Will trend labs.   Antonietta Breach, PA-C 03/12/22 [phone]

## 2022-03-12 NOTE — ED Notes (Signed)
PT opting to leave 

## 2022-03-12 NOTE — ED Triage Notes (Signed)
Pt comes via [facility] for abd pain, she was unable to complete her peritoneal dialysis tonight due to the pain.

## 2022-03-12 NOTE — ED Triage Notes (Addendum)
Pt in after waiting to be seen at [facility] earlier, c/o RUQ pain and n/v. Hx peritoneal dialysis and DM, CBG 208 in triage. States she was unable to do her PD last night due to ED visit

## 2022-03-13 ENCOUNTER — Encounter: Payer: Self-pay | Admitting: Physician Assistant

## 2022-03-15 ENCOUNTER — Other Ambulatory Visit: Payer: Self-pay

## 2022-03-15 ENCOUNTER — Encounter (HOSPITAL_BASED_OUTPATIENT_CLINIC_OR_DEPARTMENT_OTHER): Payer: Self-pay | Admitting: Urology

## 2022-03-15 ENCOUNTER — Emergency Department (HOSPITAL_BASED_OUTPATIENT_CLINIC_OR_DEPARTMENT_OTHER): Payer: Medicaid Other

## 2022-03-15 ENCOUNTER — Observation Stay (HOSPITAL_BASED_OUTPATIENT_CLINIC_OR_DEPARTMENT_OTHER)
Admission: EM | Admit: 2022-03-15 | Discharge: 2022-03-16 | Disposition: A | Discharge status: Home / Self Care | Payer: Medicaid Other | Attending: Internal Medicine | Admitting: Internal Medicine

## 2022-03-15 DIAGNOSIS — R9431 Abnormal electrocardiogram [ECG] [EKG]: Secondary | ICD-10-CM | POA: Insufficient documentation

## 2022-03-15 DIAGNOSIS — Z87891 Personal history of nicotine dependence: Secondary | ICD-10-CM | POA: Diagnosis not present

## 2022-03-15 DIAGNOSIS — E1022 Type 1 diabetes mellitus with diabetic chronic kidney disease: Secondary | ICD-10-CM | POA: Diagnosis not present

## 2022-03-15 DIAGNOSIS — Z794 Long term (current) use of insulin: Secondary | ICD-10-CM | POA: Insufficient documentation

## 2022-03-15 DIAGNOSIS — I132 Hypertensive heart and chronic kidney disease with heart failure and with stage 5 chronic kidney disease, or end stage renal disease: Secondary | ICD-10-CM | POA: Diagnosis not present

## 2022-03-15 DIAGNOSIS — Z992 Dependence on renal dialysis: Secondary | ICD-10-CM | POA: Insufficient documentation

## 2022-03-15 DIAGNOSIS — R109 Unspecified abdominal pain: Secondary | ICD-10-CM | POA: Insufficient documentation

## 2022-03-15 DIAGNOSIS — Z79899 Other long term (current) drug therapy: Secondary | ICD-10-CM | POA: Diagnosis not present

## 2022-03-15 DIAGNOSIS — E875 Hyperkalemia: Secondary | ICD-10-CM | POA: Insufficient documentation

## 2022-03-15 DIAGNOSIS — N186 End stage renal disease: Secondary | ICD-10-CM | POA: Insufficient documentation

## 2022-03-15 DIAGNOSIS — R112 Nausea with vomiting, unspecified: Principal | ICD-10-CM | POA: Insufficient documentation

## 2022-03-15 DIAGNOSIS — E1065 Type 1 diabetes mellitus with hyperglycemia: Secondary | ICD-10-CM | POA: Diagnosis not present

## 2022-03-15 DIAGNOSIS — I5022 Chronic systolic (congestive) heart failure: Secondary | ICD-10-CM | POA: Insufficient documentation

## 2022-03-15 DIAGNOSIS — I10 Essential (primary) hypertension: Secondary | ICD-10-CM

## 2022-03-15 LAB — CBC WITH DIFFERENTIAL/PLATELET
Abs Immature Granulocytes: 0.01 10*3/uL (ref 0.00–0.07)
Basophils Absolute: 0 10*3/uL (ref 0.0–0.1)
Basophils Relative: 1 %
Eosinophils Absolute: 0.1 10*3/uL (ref 0.0–0.5)
Eosinophils Relative: 2 %
HCT: 36.8 % (ref 36.0–46.0)
Hemoglobin: 12 g/dL (ref 12.0–15.0)
Immature Granulocytes: 0 %
Lymphocytes Relative: 20 %
Lymphs Abs: 0.9 10*3/uL (ref 0.7–4.0)
MCH: 31.3 pg (ref 26.0–34.0)
MCHC: 32.6 g/dL (ref 30.0–36.0)
MCV: 95.8 fL (ref 80.0–100.0)
Monocytes Absolute: 0.2 10*3/uL (ref 0.1–1.0)
Monocytes Relative: 5 %
Neutro Abs: 3.4 10*3/uL (ref 1.7–7.7)
Neutrophils Relative %: 72 %
Platelets: 229 10*3/uL (ref 150–400)
RBC: 3.84 MIL/uL — ABNORMAL LOW (ref 3.87–5.11)
RDW: 13.9 % (ref 11.5–15.5)
WBC: 4.6 10*3/uL (ref 4.0–10.5)
nRBC: 0 % (ref 0.0–0.2)

## 2022-03-15 LAB — COMPREHENSIVE METABOLIC PANEL
ALT: 11 U/L (ref 0–44)
AST: 15 U/L (ref 15–41)
Albumin: 2.9 g/dL — ABNORMAL LOW (ref 3.5–5.0)
Alkaline Phosphatase: 58 U/L (ref 38–126)
Anion gap: 9 (ref 5–15)
BUN: 71 mg/dL — ABNORMAL HIGH (ref 6–20)
CO2: 23 mmol/L (ref 22–32)
Calcium: 8.2 mg/dL — ABNORMAL LOW (ref 8.9–10.3)
Chloride: 100 mmol/L (ref 98–111)
Creatinine, Ser: 10.55 mg/dL — ABNORMAL HIGH (ref 0.44–1.00)
GFR, Estimated: 5 mL/min — ABNORMAL LOW (ref >60)
Glucose, Bld: 242 mg/dL — ABNORMAL HIGH (ref 70–99)
Potassium: 5.8 mmol/L — ABNORMAL HIGH (ref 3.5–5.1)
Sodium: 132 mmol/L — ABNORMAL LOW (ref 135–145)
Total Bilirubin: 0.8 mg/dL (ref 0.3–1.2)
Total Protein: 6.1 g/dL — ABNORMAL LOW (ref 6.5–8.1)

## 2022-03-15 LAB — I-STAT VENOUS BLOOD GAS, ED
Acid-Base Excess: 0 mmol/L (ref 0.0–2.0)
Bicarbonate: 23.8 mmol/L (ref 20.0–28.0)
Calcium, Ion: 0.97 mmol/L — ABNORMAL LOW (ref 1.15–1.40)
HCT: 37 % (ref 36.0–46.0)
Hemoglobin: 12.6 g/dL (ref 12.0–15.0)
O2 Saturation: 77 %
Potassium: 6.2 mmol/L — ABNORMAL HIGH (ref 3.5–5.1)
Sodium: 131 mmol/L — ABNORMAL LOW (ref 135–145)
TCO2: 25 mmol/L (ref 22–32)
pCO2, Ven: 34.2 mmHg — ABNORMAL LOW (ref 44–60)
pH, Ven: 7.451 — ABNORMAL HIGH (ref 7.25–7.43)
pO2, Ven: 39 mmHg (ref 32–45)

## 2022-03-15 LAB — URINALYSIS, ROUTINE W REFLEX MICROSCOPIC
Bilirubin Urine: NEGATIVE
Glucose, UA: >=500 mg/dL — AB
Ketones, ur: NEGATIVE mg/dL
Nitrite: NEGATIVE
Protein, ur: >=300 mg/dL — AB
Specific Gravity, Urine: 1.02 (ref 1.005–1.030)
pH: 6.5 (ref 5.0–8.0)

## 2022-03-15 LAB — CBG MONITORING, ED
Glucose-Capillary: 253 mg/dL — ABNORMAL HIGH (ref 70–99)
Glucose-Capillary: 191 mg/dL — ABNORMAL HIGH (ref 70–99)
Glucose-Capillary: 128 mg/dL — ABNORMAL HIGH (ref 70–99)

## 2022-03-15 LAB — URINALYSIS, MICROSCOPIC (REFLEX)

## 2022-03-15 LAB — MAGNESIUM: Magnesium: 2.6 mg/dL — ABNORMAL HIGH (ref 1.7–2.4)

## 2022-03-15 LAB — LIPASE, BLOOD: Lipase: 22 U/L (ref 11–51)

## 2022-03-15 LAB — PREGNANCY, URINE: Preg Test, Ur: NEGATIVE

## 2022-03-15 MED ORDER — LORAZEPAM 2 MG/ML IJ SOLN
0.5000 mg | Freq: Once | INTRAMUSCULAR | Status: AC
  Administered 2022-03-15: 0.5 mg via INTRAVENOUS
  Filled 2022-03-15: qty 1

## 2022-03-15 MED ORDER — INSULIN ASPART 100 UNIT/ML IV SOLN
5.0000 [IU] | Freq: Once | INTRAVENOUS | Status: AC
  Administered 2022-03-15: 5 [IU] via INTRAVENOUS

## 2022-03-15 MED ORDER — ONDANSETRON HCL 4 MG/2ML IJ SOLN
4.0000 mg | Freq: Once | INTRAMUSCULAR | Status: AC
  Administered 2022-03-15: 4 mg via INTRAVENOUS
  Filled 2022-03-15: qty 2

## 2022-03-15 MED ORDER — PANTOPRAZOLE SODIUM 40 MG IV SOLR
40.0000 mg | Freq: Once | INTRAVENOUS | Status: AC
  Administered 2022-03-15: 40 mg via INTRAVENOUS
  Filled 2022-03-15: qty 10

## 2022-03-15 MED ORDER — SODIUM ZIRCONIUM CYCLOSILICATE 10 G PO PACK
10.0000 g | PACK | Freq: Once | ORAL | Status: AC
Start: 2022-03-15 — End: 2022-03-15
  Administered 2022-03-15: 10 g via ORAL
  Filled 2022-03-15: qty 1

## 2022-03-15 MED ORDER — CARVEDILOL 12.5 MG PO TABS
12.5000 mg | ORAL_TABLET | Freq: Once | ORAL | Status: AC
  Administered 2022-03-15: 12.5 mg via ORAL
  Filled 2022-03-15: qty 1

## 2022-03-15 NOTE — Assessment & Plan Note (Signed)
Stable.  Patient's on OmniPod subcutaneous insulin pump.

## 2022-03-15 NOTE — Subjective & Objective (Signed)
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  She states that she has been having this problem for the last 2 months.  She was admitted to hospital last month for DKA and abdominal pain.  Gastric emptying study was negative for any slow gastric emptying.  She continues to have abdominal pain.  She states that her abdominal pain is worse when she puts peritoneal fluid into her cavity for her dialysis.  Denies any fever or chills.  Patient [facility] of [facility].  She does come in the hospital here at [facility] since [place] is near her home.  She does not have any specialist she sees within the [facility] system.  Work-up in the ER is stable for her.  Creatinine is 10.55, serum potassium 5.8.  CT abdomen pelvis showed no acute abnormality.

## 2022-03-15 NOTE — Assessment & Plan Note (Addendum)
Stable.  Continue Coreg 12.5 mg twice daily.  Hydralazine 100 mg 3 times daily, Entresto 45/51 daily.

## 2022-03-15 NOTE — ED Notes (Addendum)
[facility] at bedside to transport pt to [facility] Report given to Isabell Jarvis, RN Awaiting response

## 2022-03-15 NOTE — Assessment & Plan Note (Signed)
Chronic.  QTc approximately 500 to 510 ms.

## 2022-03-15 NOTE — ED Triage Notes (Signed)
Continued Emesis and nausea since last seen on 7/16. Pt states lightheadedness worsening as well . H/o peritoneal dialysis (last night)

## 2022-03-15 NOTE — H&P (Signed)
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  She states that she has been having this problem for the last 2 months.  She was admitted to hospital last month for DKA and abdominal pain.  Gastric emptying study was negative for any slow gastric emptying.  She continues to have abdominal pain.  She states that her abdominal pain is worse when she puts peritoneal fluid into her cavity for her dialysis.  Denies any fever or chills.  Patient gets all of [facility].  She does come in the hospital here at [facility] since [place] is near her home.  She does not have any specialist she sees within the [facility] system.  Work-up in the ER is stable for her.  Creatinine is 10.55, serum potassium 5.8.  CT abdomen pelvis showed no acute abnormality.   ED Course: CT scan negative for acute intra-abdominal pathology.  Review of Systems:  Review of Systems  Constitutional:  Negative for chills and fever.  HENT: Negative.    Eyes: Negative.   Respiratory: Negative.    Cardiovascular: Negative.   Gastrointestinal:  Positive for abdominal pain, nausea and vomiting.  Genitourinary: Negative.   Musculoskeletal: Negative.   Skin: Negative.   Neurological: Negative.   Endo/Heme/Allergies: Negative.   Psychiatric/Behavioral: Negative.    All other systems reviewed  and are negative.   Past Medical History:  Diagnosis Date   Anxiety    Bipolar 2 disorder (HCC)    Chronic systolic (congestive) heart failure (HCC)    Depression    DKA (diabetic ketoacidoses)    ESRD on peritoneal dialysis ([place])    HSV infection    on valtrex   Hypokalemia    Leukocytosis    Migraine    Noncompliance with medication regimen    Preeclampsia    Severe anemia    Type 1 diabetes mellitus ([place])     Past Surgical History:  Procedure Laterality Date   CARDIAC CATHETERIZATION     DILATION AND EVACUATION N/A 10/22/2019   Procedure: ULTRASOUND GUIDED DILATATION AND EVACUATION;  Surgeon: [name], MD;  Location: [facility] LD ORS;  Service: Gynecology;  Laterality: N/A;   NO PAST SURGERIES     peritoneal dialysis catheter insertion     RENAL BIOPSY       reports that she has quit smoking. Her smoking use included cigars and cigarettes. She has a 2.00 pack-year smoking history. She has never used smokeless tobacco. She reports that she does not drink alcohol and does not use drugs.  Allergies  Allergen Reactions   Cantaloupe Extract Allergy Skin Test Itching    Mouth itching     Strawberry Extract Itching    Mouth itches   Citrullus Vulgaris Itching    Makes mouth itch , ALL melons    Nsaids Other (See Comments)    Avoid per nephrology    Family History  Adopted: Yes  Problem Relation  Age of Onset   Heart disease Neg Hx     Prior to Admission medications   Medication Sig Start Date End Date Taking? Authorizing Provider  acetaminophen (TYLENOL) 500 MG tablet Take 1,000 mg by mouth every 6 (six) hours as needed for headache (pain).    [provider]  azelastine (ASTELIN) 0.1 % nasal spray Place 2 sprays into both nostrils 2 (two) times daily. Use in each nostril as directed Patient not taking: Reported on 02/26/2022 12/14/21   Hunsucker, Bonna Gains, MD  butalbital-acetaminophen-caffeine (FIORICET) [phone] MG tablet Take 1 tablet by mouth daily  as needed for headache.    [provider]  calcitRIOL (ROCALTROL) 0.25 MCG capsule Take 0.25 mcg by mouth every morning. 08/03/21   [provider]  Calcium Carbonate Antacid (TUMS PO) Take 2 tablets by mouth 3 (three) times daily with meals.    [provider]  carvedilol (COREG) 12.5 MG tablet Take 12.5 mg by mouth 2 (two) times daily with a meal.    [provider]  ferrous sulfate 325 (65 FE) MG tablet Take 325 mg by mouth 3 (three) times daily with meals.    [provider]  fluticasone (FLONASE) 50 MCG/ACT nasal spray Place 1 spray into both nostrils in the morning and at bedtime. Patient not taking: Reported on 02/26/2022 12/14/21   Hunsucker, Bonna Gains, MD  hydrALAZINE (APRESOLINE) 100 MG tablet Take 100 mg by mouth 3 (three) times daily. 08/05/21   [provider]  Insulin Disposable Pump (OMNIPOD 5 G6 POD, GEN 5,) MISC Inject into the skin continuous. Use with Humalog    [provider]  insulin lispro (HUMALOG) 100 UNIT/ML injection Inject 7 Units into the skin once. 01/08/22   [provider]  Insulin Syringe-Needle U-100 (INSULIN SYRINGE .3CC/31GX5/16") 31G X 5/16" 0.3 ML MISC Use as directed 10/27/19   [name], MD  magnesium oxide (MAG-OX) 400 MG tablet Take 400 mg by mouth daily. 08/03/21   [provider]  metoCLOPramide (REGLAN) 10 MG tablet Take 1 tablet (10 mg total) by mouth every 8 (eight) hours as needed for nausea. 01/17/22   Lucrezia Starch, MD  ondansetron (ZOFRAN) 4 MG tablet Take 4 mg by mouth every 8 (eight) hours as needed for nausea or vomiting.    [provider]  prochlorperazine (COMPAZINE) 5 MG tablet Take 1 tablet (5 mg total) by mouth every 6 (six) hours as needed for nausea or vomiting. 01/21/22   Regan Lemming, MD  sacubitril-valsartan (ENTRESTO) 49-51 MG Take 1 tablet by mouth every morning.    [provider]  Vitamin D, Ergocalciferol, (DRISDOL) 1.25 MG (50000  UNIT) CAPS capsule Take 50,000 Units by mouth every Sunday.    [provider]  escitalopram (LEXAPRO) 10 MG tablet Take 20 mg by mouth daily.  05/10/20 10/06/20  [provider]  furosemide (LASIX) 40 MG tablet Take 1 tablet (40 mg total) by mouth daily. Patient not taking: No sig reported 10/28/19 02/17/21  [name], MD  lisinopril (ZESTRIL) 10 MG tablet Take 10 mg by mouth daily.  02/23/20 10/06/20  [provider]  spironolactone (ALDACTONE) 25 MG tablet Take 25 mg by mouth daily.  04/20/20 10/06/20  [provider]    Physical Exam: Vitals:   03/15/22 1800 03/15/22 1900 03/15/22 1908 03/15/22 2242  BP: (!) 153/122 (!) 144/117  (!) 154/124  Pulse: (!) 107 (!) 109  (!) 109  Resp: 17 16  18   Temp:  98.7 F (37.1 C) 97.8 F (36.6 C)  TempSrc:   Oral   SpO2: 99% 100%  97%  Weight:    61.4 kg  Height:        Physical Exam Vitals and nursing note reviewed.  Constitutional:      General: She is not in acute distress.    Appearance: Normal appearance. She is normal weight. She is not ill-appearing, toxic-appearing or diaphoretic.     Comments: Patient was finishing eating an [place] ice while sitting in bed.  No nausea or vomiting noted.  HENT:     Head: Normocephalic and atraumatic.     Nose: Nose normal. No rhinorrhea.  Eyes:     General: No scleral icterus. Cardiovascular:     Rate and Rhythm: Regular rhythm. Tachycardia present.     Pulses: Normal pulses.     Heart sounds: Normal heart sounds.  Pulmonary:     Effort: Pulmonary effort is normal. No respiratory distress.     Breath sounds: Normal breath sounds. No wheezing or rales.  Abdominal:     General: Bowel sounds are normal.     Palpations: Abdomen is soft.     Tenderness: There is generalized abdominal tenderness. There is no guarding or rebound.     Comments: Peritoneal dialysis catheter in place.  Musculoskeletal:     Right lower leg: Edema present.     Left lower leg: Edema  present.     Comments: Trace pretibial edema.  Skin:    General: Skin is warm.     Capillary Refill: Capillary refill takes less than 2 seconds.  Neurological:     General: No focal deficit present.     Mental Status: She is alert and oriented to person, place, and time.      Labs on Admission: I have personally reviewed following labs and imaging studies  CBC: Recent Labs  Lab 03/11/22 0413 03/12/22 0257 03/15/22 1512 03/15/22 1655  WBC 4.8 5.4 4.6  --   NEUTROABS 4.3 4.2 3.4  --   HGB 12.1 12.7 12.0 12.6  HCT 36.9 38.4 36.8 37.0  MCV 96.3 95.3 95.8  --   PLT 230 268 229  --    Basic Metabolic Panel: Recent Labs  Lab 03/11/22 0413 03/12/22 0257 03/12/22 0440 03/15/22 1512 03/15/22 1655  NA 140 141 138 132* 131*  K 4.6 4.7 4.8 5.8* 6.2*  CL 106 100 104 100  --   CO2 25 22 22 23   --   GLUCOSE 167* 194* 206* 242*  --   BUN 68* 67* 69* 71*  --   CREATININE 9.36* 9.80* 9.78* 10.55*  --   CALCIUM 8.1* 9.0 8.7* 8.2*  --   MG  --   --   --  2.6*  --    GFR: Estimated Creatinine Clearance: 6.6 mL/min (A) (by C-G formula based on SCr of 10.55 mg/dL (H)). Liver Function Tests: Recent Labs  Lab 03/11/22 0413 03/12/22 0257 03/15/22 1512  AST 20 18 15   ALT 17 11 11   ALKPHOS 62 68 58  BILITOT 0.7 0.9 0.8  PROT 5.9* 6.3* 6.1*  ALBUMIN 2.8* 3.0* 2.9*   Recent Labs  Lab 03/12/22 0257 03/15/22 1512  LIPASE 20 22   No results for input(s): "AMMONIA" in the last 168 hours. Coagulation Profile: No results for input(s): "INR", "PROTIME" in the last 168 hours. Cardiac Enzymes: No results for input(s): "CKTOTAL", "CKMB", "CKMBINDEX", "TROPONINI", "TROPONINIHS" in the last 168 hours. BNP (last 3  results) No results for input(s): "PROBNP" in the last 8760 hours. HbA1C: No results for input(s): "HGBA1C" in the last 72 hours. CBG: Recent Labs  Lab 03/11/22 0249 03/12/22 0408 03/15/22 1503 03/15/22 1829 03/15/22 2132  GLUCAP 158* 208* 253* 191* 128*   Lipid  Profile: No results for input(s): "CHOL", "HDL", "LDLCALC", "TRIG", "CHOLHDL", "LDLDIRECT" in the last 72 hours. Thyroid Function Tests: No results for input(s): "TSH", "T4TOTAL", "FREET4", "T3FREE", "THYROIDAB" in the last 72 hours. Anemia Panel: No results for input(s): "VITAMINB12", "FOLATE", "FERRITIN", "TIBC", "IRON", "RETICCTPCT" in the last 72 hours. Urine analysis:    Component Value Date/Time   COLORURINE YELLOW 03/15/2022 1656   APPEARANCEUR CLEAR 03/15/2022 1656   LABSPEC 1.020 03/15/2022 1656   PHURINE 6.5 03/15/2022 1656   GLUCOSEU >=500 (A) 03/15/2022 1656   HGBUR SMALL (A) 03/15/2022 1656   BILIRUBINUR NEGATIVE 03/15/2022 1656   KETONESUR NEGATIVE 03/15/2022 1656   PROTEINUR >=300 (A) 03/15/2022 1656   UROBILINOGEN 0.2 12/15/2015 1736   NITRITE NEGATIVE 03/15/2022 1656   LEUKOCYTESUR TRACE (A) 03/15/2022 1656    Radiological Exams on Admission: I have personally reviewed images CT ABDOMEN PELVIS WO CONTRAST  Result Date: 03/15/2022 CLINICAL DATA:  Acute abdominal pain. EXAM: CT ABDOMEN AND PELVIS WITHOUT CONTRAST TECHNIQUE: Multidetector CT imaging of the abdomen and pelvis was performed following the standard protocol without IV contrast. RADIATION DOSE REDUCTION: This exam was performed according to the departmental dose-optimization program which includes automated exposure control, adjustment of the mA and/or kV according to patient size and/or use of iterative reconstruction technique. COMPARISON:  CT 01/21/2022 FINDINGS: Lower chest: Again seen cardiomegaly. Similar pericardial effusion. Slight improvement in basilar septal thickening. Chronic compressive atelectasis in the left lower lobe. Hepatobiliary: No evidence of focal hepatic abnormality on this unenhanced exam. Mild hepatic steatosis. Gallbladder physiologically distended, no calcified stone. No biliary dilatation. Pancreas: Parenchymal atrophy. Spleen: Normal in size without focal abnormality. Adrenals/Urinary  Tract: No adrenal nodule. No hydronephrosis or renal calculi. Urinary bladder is partially distended, mild wall thickening, nonspecific. Stomach/Bowel: The stomach is decompressed. There is no evidence of bowel obstruction. Free fluid in the abdomen which may be due to peritoneal dialysis limits detailed bowel assessment. High-density material within the colon consistent with ingested material, possibly bismuth containing products. No obvious colonic inflammation. Normal appendix is visualized Vascular/Lymphatic: Normal caliber abdominal aorta. No bulky abdominopelvic adenopathy. Reproductive: Anteverted uterus.  No obvious adnexal mass. Other: Peritoneal dialysis catheter is coiled in the pelvis. Similar degree of free fluid throughout the abdomen and pelvis to prior exam, possibly related to peritoneal dialysis. No evidence of loculated fluid collection or free air. There is moderate generalized subcutaneous edema Musculoskeletal: There are no acute or suspicious osseous abnormalities. IMPRESSION: 1. No acute abnormality in the abdomen/pelvis. 2. Peritoneal dialysis catheter coiled in the pelvis. Similar degree of free fluid throughout the abdomen and pelvis, possibly related to peritoneal dialysis. 3. Mild hepatic steatosis. 4. Cardiomegaly with small pericardial effusion is well as generalized body wall edema, similar to prior exam. Electronically Signed   By: Keith Rake M.D.   On: 03/15/2022 17:42    EKG: My personal interpretation of EKG shows: NSR      Assessment/Plan Principal Problem:   Nausea & vomiting Active Problems:   Chronic systolic CHF (congestive heart failure) (HCC)   Essential hypertension   ESRD (end stage renal disease) (HCC) - on peritoneal dialysis   Abdominal pain of unknown cause   Uncontrolled type 1 diabetes mellitus with hyperglycemia, with long-term  current use of insulin (HCC)    Assessment and Plan: * Nausea & vomiting Observation medical telemetry bed.   Does not appear in DKA.  Patient had recent hospitalization for the exact same thing.  Patient states that placing peritoneal dialysate into her peritoneal cavity causes her abdominal pain and nausea to be worse.  Based on her CT scan her peritoneal catheter looks like it is in the right place.  Not sure there is anything else we can do to help her.  All of [facility] specialists [facility] with [facility].  She has not seen any providers within the [facility] system except to come to the hospital.  Continue supportive care.  Check urine drug screen to make sure she is not using marijuana.  She had a gastric emptying study done last month which was negative.  Uncontrolled type 1 diabetes mellitus with hyperglycemia, with long-term current use of insulin (HCC) Stable.  Patient's on OmniPod subcutaneous insulin pump.  Abdominal pain of unknown cause Chronic.  CT scan is negative.  Gastric empty study done last month which was negative.  ESRD (end stage renal disease) ([place]) - on peritoneal dialysis Is on peritoneal dialysis at home.  Will need nephrology consult to order peritoneal dialysate.  Essential hypertension Stable.  Continue Coreg 12.5 mg twice daily.  Hydralazine 100 mg 3 times daily, Entresto 45/51 daily.  Chronic systolic CHF (congestive heart failure) (HCC) Chronic.  Appears euvolemic.  Continue guideline directed medical therapy with Coreg 12.5 mg twice daily and Entresto daily.   DVT prophylaxis: SQ Heparin Code Status: Full Code Family Communication: no family at bedside  Disposition Plan: return home  Consults called: none  Admission status: Observation, Telemetry bed   Kristopher Oppenheim, DO [facility] 03/15/2022, 11:51 PM

## 2022-03-15 NOTE — ED Notes (Signed)
Returned from imaging,

## 2022-03-15 NOTE — Assessment & Plan Note (Signed)
Is on peritoneal dialysis at home.  Will need nephrology consult to order peritoneal dialysate.

## 2022-03-15 NOTE — ED Notes (Signed)
[facility] called, 25 min ETA, report given

## 2022-03-15 NOTE — ED Notes (Signed)
Pt sleeping comfortably at this time.

## 2022-03-15 NOTE — ED Provider Notes (Addendum)
[place] EMER[facility]ENCY DEPARTMENT Provider Note   CSN: [id number] Arrival date & time: 03/15/22  1452     History  Chief Complaint  Patient presents with   Emesis    Kathy Lewis is a 26 y.o. female.  Patient here with ongoing nausea and vomiting and upper abdominal pain.  Symptoms on and off for the last 3 months.  Worse over the last several days.  Is supposed to follow-up with GI next month.  Denies any black stools or bloody stools.  She has a history of end-stage renal disease on peritoneal dialysis.  History of diabetes, congestive heart failure, bipolar, depression.  She denies any chest pain, shortness of breath.  Was able to complete most of her peritoneal dialysis yesterday.  Denies any fever, chills.  The history is provided by the patient.       Home Medications Prior to Admission medications   Medication Sig Start Date End Date Taking? Authorizing Provider  acetaminophen (TYLENOL) 500 MG tablet Take 1,000 mg by mouth every 6 (six) hours as needed for headache (pain).    [provider]  azelastine (ASTELIN) 0.1 % nasal spray Place 2 sprays into both nostrils 2 (two) times daily. Use in each nostril as directed Patient not taking: Reported on 02/26/2022 12/14/21   Hunsucker, Bonna Gains, MD  butalbital-acetaminophen-caffeine (FIORICET) [phone] MG tablet Take 1 tablet by mouth daily as needed for headache.    [provider]  calcitRIOL (ROCALTROL) 0.25 MCG capsule Take 0.25 mcg by mouth every morning. 08/03/21   [provider]  Calcium Carbonate Antacid (TUMS PO) Take 2 tablets by mouth 3 (three) times daily with meals.    [provider]  carvedilol (COREG) 12.5 MG tablet Take 12.5 mg by mouth 2 (two) times daily with a meal.    [provider]  ferrous sulfate 325 (65 FE) MG tablet Take 325 mg by mouth 3 (three) times daily with meals.    [provider]  fluticasone (FLONASE) 50 MCG/ACT nasal spray Place 1  spray into both nostrils in the morning and at bedtime. Patient not taking: Reported on 02/26/2022 12/14/21   Hunsucker, Bonna Gains, MD  hydrALAZINE (APRESOLINE) 100 MG tablet Take 100 mg by mouth 3 (three) times daily. 08/05/21   [provider]  Insulin Disposable Pump (OMNIPOD 5 G6 POD, GEN 5,) MISC Inject into the skin continuous. Use with Humalog    [provider]  insulin lispro (HUMALOG) 100 UNIT/ML injection Inject 7 Units into the skin once. 01/08/22   [provider]  Insulin Syringe-Needle U-100 (INSULIN SYRINGE .3CC/31GX5/16") 31G X 5/16" 0.3 ML MISC Use as directed 10/27/19   [name], MD  magnesium oxide (MAG-OX) 400 MG tablet Take 400 mg by mouth daily. 08/03/21   [provider]  metoCLOPramide (REGLAN) 10 MG tablet Take 1 tablet (10 mg total) by mouth every 8 (eight) hours as needed for nausea. 01/17/22   Lucrezia Starch, MD  ondansetron (ZOFRAN) 4 MG tablet Take 4 mg by mouth every 8 (eight) hours as needed for nausea or vomiting.    [provider]  prochlorperazine (COMPAZINE) 5 MG tablet Take 1 tablet (5 mg total) by mouth every 6 (six) hours as needed for nausea or vomiting. 01/21/22   Regan Lemming, MD  sacubitril-valsartan (ENTRESTO) 49-51 MG Take 1 tablet by mouth every morning.    [provider]  Vitamin D, Ergocalciferol, (DRISDOL) 1.25 MG (50000 UNIT) CAPS capsule Take 50,000 Units by  mouth every Sunday.    [provider]  escitalopram (LEXAPRO) 10 MG tablet Take 20 mg by mouth daily.  05/10/20 10/06/20  [provider]  furosemide (LASIX) 40 MG tablet Take 1 tablet (40 mg total) by mouth daily. Patient not taking: No sig reported 10/28/19 02/17/21  [name], MD  lisinopril (ZESTRIL) 10 MG tablet Take 10 mg by mouth daily.  02/23/20 10/06/20  [provider]  spironolactone (ALDACTONE) 25 MG tablet Take 25 mg by mouth daily.  04/20/20 10/06/20  [provider]      Allergies     Cantaloupe extract allergy skin test, Strawberry extract, Citrullus vulgaris, and Nsaids    Review of Systems   Review of Systems  Physical Exam Updated Vital Signs BP (!) 153/122   Pulse (!) 107   Temp 98.5 F (36.9 C) (Oral)   Resp 17   Ht 5' (1.524 m)   Wt 65.3 kg   SpO2 99%   BMI 28.12 kg/m  Physical Exam Vitals and nursing note reviewed.  Constitutional:      General: She is not in acute distress.    Appearance: She is well-developed. She is not ill-appearing.  HENT:     Head: Normocephalic and atraumatic.     Mouth/Throat:     Mouth: Mucous membranes are moist.  Eyes:     Extraocular Movements: Extraocular movements intact.     Conjunctiva/sclera: Conjunctivae normal.     Pupils: Pupils are equal, round, and reactive to light.  Cardiovascular:     Rate and Rhythm: Normal rate and regular rhythm.     Pulses: Normal pulses.     Heart sounds: Normal heart sounds. No murmur heard. Pulmonary:     Effort: Pulmonary effort is normal. No respiratory distress.     Breath sounds: Normal breath sounds.  Abdominal:     Palpations: Abdomen is soft.     Tenderness: There is abdominal tenderness.     Comments: Epigastric but no peritoneal signs  Musculoskeletal:        General: No swelling.     Cervical back: Normal range of motion and neck supple.  Skin:    General: Skin is warm and dry.     Capillary Refill: Capillary refill takes less than 2 seconds.  Neurological:     General: No focal deficit present.     Mental Status: She is alert and oriented to person, place, and time.     Cranial Nerves: No cranial nerve deficit.     Sensory: No sensory deficit.     Motor: No weakness.     Coordination: Coordination normal.  Psychiatric:        Mood and Affect: Mood normal.     ED Results / Procedures / Treatments   Labs (all labs ordered are listed, but only abnormal results are displayed) Labs Reviewed  CBC WITH DIFFERENTIAL/PLATELET - Abnormal; Notable for the  following components:      Result Value   RBC 3.84 (*)    All other components within normal limits  COMPREHENSIVE METABOLIC PANEL - Abnormal; Notable for the following components:   Sodium 132 (*)    Potassium 5.8 (*)    Glucose, Bld 242 (*)    BUN 71 (*)    Creatinine, Ser 10.55 (*)    Calcium 8.2 (*)    Total Protein 6.1 (*)    Albumin 2.9 (*)    GFR, Estimated 5 (*)    All other components within normal  limits  URINALYSIS, ROUTINE W REFLEX MICROSCOPIC - Abnormal; Notable for the following components:   Glucose, UA >=500 (*)    Hgb urine dipstick SMALL (*)    Protein, ur >=300 (*)    Leukocytes,Ua TRACE (*)    All other components within normal limits  URINALYSIS, MICROSCOPIC (REFLEX) - Abnormal; Notable for the following components:   Bacteria, UA MANY (*)    All other components within normal limits  CBG MONITORING, ED - Abnormal; Notable for the following components:   Glucose-Capillary 253 (*)    All other components within normal limits  I-STAT VENOUS BLOOD GAS, ED - Abnormal; Notable for the following components:   pH, Ven 7.451 (*)    pCO2, Ven 34.2 (*)    Sodium 131 (*)    Potassium 6.2 (*)    Calcium, Ion 0.97 (*)    All other components within normal limits  CBG MONITORING, ED - Abnormal; Notable for the following components:   Glucose-Capillary 191 (*)    All other components within normal limits  URINE CULTURE  PREGNANCY, URINE  LIPASE, BLOOD  BLOOD GAS, VENOUS  MAGNESIUM    EKG EKG Interpretation  Date/Time:  Wednesday March 15 2022 16:31:41 EDT Ventricular Rate:  93 PR Interval:  138 QRS Duration: 91 QT Interval:  406 QTC Calculation: 505 R Axis:   -75 Text Interpretation: Sinus rhythm Probable left atrial enlargement Confirmed by Ronnald Nian, Lorana Maffeo ([id number]) on 03/15/2022 4:45:55 PM  Radiology CT ABDOMEN PELVIS WO CONTRAST  Result Date: 03/15/2022 CLINICAL DATA:  Acute abdominal pain. EXAM: CT ABDOMEN AND PELVIS WITHOUT CONTRAST TECHNIQUE:  Multidetector CT imaging of the abdomen and pelvis was performed following the standard protocol without IV contrast. RADIATION DOSE REDUCTION: This exam was performed according to the departmental dose-optimization program which includes automated exposure control, adjustment of the mA and/or kV according to patient size and/or use of iterative reconstruction technique. COMPARISON:  CT 01/21/2022 FINDINGS: Lower chest: Again seen cardiomegaly. Similar pericardial effusion. Slight improvement in basilar septal thickening. Chronic compressive atelectasis in the left lower lobe. Hepatobiliary: No evidence of focal hepatic abnormality on this unenhanced exam. Mild hepatic steatosis. Gallbladder physiologically distended, no calcified stone. No biliary dilatation. Pancreas: Parenchymal atrophy. Spleen: Normal in size without focal abnormality. Adrenals/Urinary Tract: No adrenal nodule. No hydronephrosis or renal calculi. Urinary bladder is partially distended, mild wall thickening, nonspecific. Stomach/Bowel: The stomach is decompressed. There is no evidence of bowel obstruction. Free fluid in the abdomen which may be due to peritoneal dialysis limits detailed bowel assessment. High-density material within the colon consistent with ingested material, possibly bismuth containing products. No obvious colonic inflammation. Normal appendix is visualized Vascular/Lymphatic: Normal caliber abdominal aorta. No bulky abdominopelvic adenopathy. Reproductive: Anteverted uterus.  No obvious adnexal mass. Other: Peritoneal dialysis catheter is coiled in the pelvis. Similar degree of free fluid throughout the abdomen and pelvis to prior exam, possibly related to peritoneal dialysis. No evidence of loculated fluid collection or free air. There is moderate generalized subcutaneous edema Musculoskeletal: There are no acute or suspicious osseous abnormalities. IMPRESSION: 1. No acute abnormality in the abdomen/pelvis. 2. Peritoneal  dialysis catheter coiled in the pelvis. Similar degree of free fluid throughout the abdomen and pelvis, possibly related to peritoneal dialysis. 3. Mild hepatic steatosis. 4. Cardiomegaly with small pericardial effusion is well as generalized body wall edema, similar to prior exam. Electronically Signed   By: Keith Rake M.D.   On: 03/15/2022 17:42    Procedures Procedures    Medications Ordered  in ED Medications  LORazepam (ATIVAN) injection 0.5 mg (has no administration in time range)  pantoprazole (PROTONIX) injection 40 mg (40 mg Intravenous Given 03/15/22 1658)  ondansetron (ZOFRAN) injection 4 mg (4 mg Intravenous Given 03/15/22 1658)  sodium zirconium cyclosilicate (LOKELMA) packet 10 g (10 g Oral Given 03/15/22 1744)  insulin aspart (novoLOG) injection 5 Units (5 Units Intravenous Given 03/15/22 1744)    ED Course/ Medical Decision Making/ A&P                           Medical Decision Making Amount and/or Complexity of Data Reviewed Labs: ordered. Radiology: ordered.  Risk OTC drugs. Prescription drug management. Decision regarding hospitalization.   Kathy Lewis is here with nausea and vomiting upper abdominal pain.  History of end-stage renal disease on peritoneal dialysis, diabetes on insulin.  Symptoms for the last several days.  History of similar over the last several months.  Per chart review she had a normal gastric emptying study.  Supposed to follow-up with GI next month.  Denies any black or bloody stools.  Denies any alcohol or marijuana use.  Differential diagnosis is gastritis versus pancreatitis versus hyperemesis syndrome versus less likely bowel obstruction.  I have no concern for infectious process given normal vitals including no fever.  We will get a CBC, CMP, lipase, urinalysis, urine pregnancy test, CT scan abdomen pelvis.  We will give a dose IV Zofran and IV Protonix.  EKG per my review and interpretation shows sinus rhythm. QTC 505. No ischemic  changes.  Per my review and interpretation of labs, potassium is 5.8.  She was given Lokelma and insulin.  Patient otherwise with unremarkable labs.  CT scan showed no acute findings.  Overall she still feeling discomfort and is not able to tolerate much more than some small amount of fluid.  She is nervous about being able to tolerate her peritoneal dialysis at home.  Given some electrolyte abnormalities and still with some difficulty with p.o. I think it would be reasonable to admit her for observation stay.  My suspicion is that she might have some element of esophagitis/gastritis/gastroparesis.  This chart was dictated using voice recognition software.  Despite best efforts to proofread,  errors can occur which can change the documentation meaning.         Final Clinical Impression(s) / ED Diagnoses Final diagnoses:  Nausea and vomiting, unspecified vomiting type  Hyperkalemia    Rx / DC Orders ED Discharge Orders     None         Lennice Sites, DO 03/15/22 1859    Lennice Sites, DO 03/15/22 1900

## 2022-03-15 NOTE — Assessment & Plan Note (Addendum)
Chronic.  Appears euvolemic.  Continue guideline directed medical therapy with Coreg 12.5 mg twice daily and Entresto daily.

## 2022-03-15 NOTE — Assessment & Plan Note (Signed)
Chronic.  CT scan is negative.  Gastric empty study done last month which was negative.

## 2022-03-15 NOTE — Assessment & Plan Note (Addendum)
Observation medical telemetry bed.  Does not appear in DKA.  Patient had recent hospitalization for the exact same thing.  Patient states that placing peritoneal dialysate into her peritoneal cavity causes her abdominal pain and nausea to be worse.  Based on her CT scan her peritoneal catheter looks like it is in the right place.  Not sure there is anything else we can do to help her.  All of her specialists are associated with [facility].  She has not seen any providers within the [facility] system except to come to the hospital.  Continue supportive care.  Check urine drug screen to make sure she is not using marijuana.  She had a gastric emptying study done last month which was negative.

## 2022-03-16 ENCOUNTER — Other Ambulatory Visit (HOSPITAL_COMMUNITY): Payer: Self-pay

## 2022-03-16 DIAGNOSIS — R109 Unspecified abdominal pain: Secondary | ICD-10-CM | POA: Diagnosis not present

## 2022-03-16 DIAGNOSIS — N186 End stage renal disease: Secondary | ICD-10-CM | POA: Diagnosis not present

## 2022-03-16 DIAGNOSIS — I10 Essential (primary) hypertension: Secondary | ICD-10-CM | POA: Diagnosis not present

## 2022-03-16 DIAGNOSIS — I5022 Chronic systolic (congestive) heart failure: Secondary | ICD-10-CM | POA: Diagnosis not present

## 2022-03-16 LAB — COMPREHENSIVE METABOLIC PANEL
ALT: 11 U/L (ref 0–44)
AST: 11 U/L — ABNORMAL LOW (ref 15–41)
Albumin: 2.4 g/dL — ABNORMAL LOW (ref 3.5–5.0)
Alkaline Phosphatase: 53 U/L (ref 38–126)
Anion gap: 8 (ref 5–15)
BUN: 69 mg/dL — ABNORMAL HIGH (ref 6–20)
CO2: 22 mmol/L (ref 22–32)
Calcium: 8 mg/dL — ABNORMAL LOW (ref 8.9–10.3)
Chloride: 103 mmol/L (ref 98–111)
Creatinine, Ser: 10.94 mg/dL — ABNORMAL HIGH (ref 0.44–1.00)
GFR, Estimated: 5 mL/min — ABNORMAL LOW (ref >60)
Glucose, Bld: 225 mg/dL — ABNORMAL HIGH (ref 70–99)
Potassium: 5.6 mmol/L — ABNORMAL HIGH (ref 3.5–5.1)
Sodium: 133 mmol/L — ABNORMAL LOW (ref 135–145)
Total Bilirubin: 0.8 mg/dL (ref 0.3–1.2)
Total Protein: 4.9 g/dL — ABNORMAL LOW (ref 6.5–8.1)

## 2022-03-16 LAB — MAGNESIUM: Magnesium: 2.6 mg/dL — ABNORMAL HIGH (ref 1.7–2.4)

## 2022-03-16 LAB — GLUCOSE, CAPILLARY
Glucose-Capillary: 125 mg/dL — ABNORMAL HIGH (ref 70–99)
Glucose-Capillary: 201 mg/dL — ABNORMAL HIGH (ref 70–99)
Glucose-Capillary: 253 mg/dL — ABNORMAL HIGH (ref 70–99)
Glucose-Capillary: 274 mg/dL — ABNORMAL HIGH (ref 70–99)
Glucose-Capillary: 275 mg/dL — ABNORMAL HIGH (ref 70–99)
Glucose-Capillary: 348 mg/dL — ABNORMAL HIGH (ref 70–99)

## 2022-03-16 LAB — URINE CULTURE

## 2022-03-16 LAB — I-STAT BETA HCG BLOOD, ED (NOT ORDERABLE): I-stat hCG, quantitative: <5 m[IU]/mL (ref <5)

## 2022-03-16 MED ORDER — HYDRALAZINE HCL 50 MG PO TABS
100.0000 mg | ORAL_TABLET | Freq: Three times a day (TID) | ORAL | Status: DC
  Administered 2022-03-16: 100 mg via ORAL
  Filled 2022-03-16: qty 2

## 2022-03-16 MED ORDER — PANTOPRAZOLE SODIUM 40 MG IV SOLR
40.0000 mg | Freq: Two times a day (BID) | INTRAVENOUS | Status: DC
  Administered 2022-03-16: 40 mg via INTRAVENOUS
  Filled 2022-03-16: qty 10

## 2022-03-16 MED ORDER — DEXTROSE 50 % IV SOLN: 0.0000 mL | INTRAVENOUS | Status: DC | PRN

## 2022-03-16 MED ORDER — GENTAMICIN SULFATE 0.1 % EX CREA
1.0000 | TOPICAL_CREAM | Freq: Every day | CUTANEOUS | Status: DC
Start: 2022-03-16 — End: 2022-03-16
  Filled 2022-03-16: qty 15

## 2022-03-16 MED ORDER — SACUBITRIL-VALSARTAN 49-51 MG PO TABS
1.0000 | ORAL_TABLET | Freq: Two times a day (BID) | ORAL | Status: DC
  Administered 2022-03-16: 1 via ORAL
  Filled 2022-03-16 (×2): qty 1

## 2022-03-16 MED ORDER — SACUBITRIL-VALSARTAN 49-51 MG PO TABS
1.0000 | ORAL_TABLET | Freq: Every morning | ORAL | Status: DC
  Filled 2022-03-16: qty 1

## 2022-03-16 MED ORDER — METOCLOPRAMIDE HCL 5 MG/ML IJ SOLN
10.0000 mg | Freq: Once | INTRAMUSCULAR | Status: AC
  Administered 2022-03-16: 10 mg via INTRAVENOUS
  Filled 2022-03-16: qty 2

## 2022-03-16 MED ORDER — ACETAMINOPHEN 325 MG PO TABS: 650.0000 mg | ORAL_TABLET | Freq: Four times a day (QID) | ORAL | Status: DC | PRN

## 2022-03-16 MED ORDER — CLONAZEPAM 0.5 MG PO TABS
0.5000 mg | ORAL_TABLET | Freq: Two times a day (BID) | ORAL | 0 refills | Status: AC | PRN
  Filled 2022-03-16: qty 20, 10d supply, fill #0

## 2022-03-16 MED ORDER — CARVEDILOL 12.5 MG PO TABS
12.5000 mg | ORAL_TABLET | Freq: Two times a day (BID) | ORAL | Status: DC
  Administered 2022-03-16: 12.5 mg via ORAL
  Filled 2022-03-16: qty 1

## 2022-03-16 MED ORDER — INSULIN PUMP
Freq: Three times a day (TID) | SUBCUTANEOUS | Status: DC
Start: 2022-03-16 — End: 2022-03-16
  Filled 2022-03-16: qty 1

## 2022-03-16 MED ORDER — DEXTROSE IN LACTATED RINGERS 5 % IV SOLN: INTRAVENOUS | Status: DC

## 2022-03-16 MED ORDER — ACETAMINOPHEN 650 MG RE SUPP: 650.0000 mg | Freq: Four times a day (QID) | RECTAL | Status: DC | PRN

## 2022-03-16 MED ORDER — PANTOPRAZOLE SODIUM 40 MG PO TBEC
40.0000 mg | DELAYED_RELEASE_TABLET | Freq: Every day | ORAL | 0 refills | Status: DC
  Filled 2022-03-16: qty 30, 30d supply, fill #0

## 2022-03-16 MED ORDER — BUTALBITAL-APAP-CAFFEINE 50-325-40 MG PO TABS
1.0000 | ORAL_TABLET | Freq: Every day | ORAL | Status: DC | PRN
  Administered 2022-03-16: 1 via ORAL
  Filled 2022-03-16: qty 1

## 2022-03-16 MED ORDER — LACTATED RINGERS IV SOLN: INTRAVENOUS | Status: DC

## 2022-03-16 MED ORDER — INSULIN REGULAR(HUMAN) IN NACL 100-0.9 UT/100ML-% IV SOLN
INTRAVENOUS | Status: DC
  Administered 2022-03-16: 4.6 [IU]/h via INTRAVENOUS
  Filled 2022-03-16: qty 100

## 2022-03-16 MED ORDER — INSULIN ASPART 100 UNIT/ML IJ SOLN: 0.0000 [IU] | Freq: Every day | INTRAMUSCULAR | Status: DC

## 2022-03-16 MED ORDER — SODIUM ZIRCONIUM CYCLOSILICATE 10 G PO PACK
10.0000 g | PACK | Freq: Once | ORAL | Status: AC
  Administered 2022-03-16: 10 g via ORAL
  Filled 2022-03-16: qty 1

## 2022-03-16 MED ORDER — INSULIN ASPART 100 UNIT/ML IJ SOLN
0.0000 [IU] | Freq: Three times a day (TID) | INTRAMUSCULAR | Status: DC
  Administered 2022-03-16: 4 [IU] via SUBCUTANEOUS

## 2022-03-16 MED ORDER — PROCHLORPERAZINE EDISYLATE 10 MG/2ML IJ SOLN
5.0000 mg | Freq: Four times a day (QID) | INTRAMUSCULAR | Status: DC | PRN
Start: 2022-03-16 — End: 2022-03-16
  Administered 2022-03-16: 5 mg via INTRAVENOUS
  Filled 2022-03-16 (×2): qty 2

## 2022-03-16 MED ORDER — CLONAZEPAM 0.5 MG PO TABS
0.5000 mg | ORAL_TABLET | Freq: Two times a day (BID) | ORAL | Status: DC
  Administered 2022-03-16: 0.5 mg via ORAL
  Filled 2022-03-16: qty 1

## 2022-03-16 MED ORDER — HEPARIN SODIUM (PORCINE) 5000 UNIT/ML IJ SOLN
5000.0000 [IU] | Freq: Three times a day (TID) | INTRAMUSCULAR | Status: DC
  Administered 2022-03-16: 5000 [IU] via SUBCUTANEOUS
  Filled 2022-03-16: qty 1

## 2022-03-16 MED ORDER — DIPHENHYDRAMINE HCL 50 MG/ML IJ SOLN
25.0000 mg | Freq: Once | INTRAMUSCULAR | Status: AC
  Administered 2022-03-16: 25 mg via INTRAVENOUS
  Filled 2022-03-16: qty 1

## 2022-03-16 NOTE — Discharge Instructions (Addendum)
Follow with Primary MD Medicine, [facility] in 7 days resume your home peritoneal dialysis regimen immediately.  Resume your home insulin pump immediately.  Get CBC, CMP, 2 view Chest X ray -  checked next visit within 1 week by Primary MD, please get a referral for outpatient psychiatrist follow-up for anxiety within a week.  Activity: As tolerated with Full fall precautions use walker/cane & assistance as needed  Disposition Home    Diet: Renal-low carbohydrate diet with 1.5 L fluid restriction per day, check CBGs as per your home schedule and monitor closely.  Special Instructions: If you have smoked or chewed Tobacco  in the last 2 yrs please stop smoking, stop any regular Alcohol  and or any Recreational drug use.  On your next visit with your primary care physician please Get Medicines reviewed and adjusted.  Please request your Prim.MD to go over all Hospital Tests and Procedure/Radiological results at the follow up, please get all Hospital records sent to your Prim MD by signing hospital release before you go home.  If you experience worsening of your admission symptoms, develop shortness of breath, life threatening emergency, suicidal or homicidal thoughts you must seek medical attention immediately by calling 911 or calling your MD immediately  if symptoms less severe.  You Must read complete instructions/literature along with all the possible adverse reactions/side effects for all the Medicines you take and that have been prescribed to you. Take any new Medicines after you have completely understood and accpet all the possible adverse reactions/side effects.

## 2022-03-16 NOTE — Consult Note (Signed)
[facility] Renal Consultation Note    Indication for Consultation:  Management of ESRD/hemodialysis; anemia, hypertension/volume and secondary hyperparathyroidism  ZOX:[id number], [facility]  HPI: Kathy Lewis is a 26 y.o. female with ESRD on PD (since last year). Patient followed at [facility] and Nephrologist is Dr. Olivia Mackie. 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 Patient presents to the ED c/o ongoing ABD pain and N/V. Noted she was hospitalized last month for DKA and ABD pain. Seen and examined patient at bedside. She reports the N/V has been ongoing for 2 months. She reports her fill volume has been recently raised to 175ml (since 03/10/22) and since then, her ABD pain has worsened, causing her not to tolerate her fills overnight. She reports tolerating 1580ml fills that were previously prescribed. Recent CT ABD (-). She denies fevers, chills, and SOB. She did however endorse CP after eating. Noted gastric emptying study also completed last month which showed normal study. Notable labs include: K+ 5.6, SrCr 10.94, BUN 69, Glucose 225, Ca 8, Albumin 2.4, and Hgb 12. Noted Lokelma given yesterday and today. Plan for PD overnight.  Past Medical History:  Diagnosis Date   Anxiety    Bipolar 2 disorder (HCC)    Chronic systolic (congestive) heart failure (HCC)    Depression    DKA (diabetic ketoacidoses)    ESRD on peritoneal dialysis ([place])    HSV infection    on valtrex   Hypokalemia    Leukocytosis    Migraine    Noncompliance with medication regimen    Preeclampsia    Severe anemia    Type 1 diabetes mellitus ([place])    Past Surgical History:  Procedure Laterality Date   CARDIAC CATHETERIZATION     DILATION AND EVACUATION N/A 10/22/2019   Procedure: ULTRASOUND GUIDED DILATATION AND EVACUATION;  Surgeon: [name], MD;  Location: [facility] LD  [facility];  Service: Gynecology;  Laterality: N/A;   NO PAST SURGERIES     peritoneal dialysis catheter insertion     RENAL BIOPSY     Family History  Adopted: Yes  Problem Relation Age of Onset   Heart disease Neg Hx    Social History:  reports that she has quit smoking. Her smoking use included cigars and cigarettes. She has a 2.00 pack-year smoking history. She has never used smokeless tobacco. She reports that she does not drink alcohol and does not use drugs. Allergies  Allergen Reactions   Cantaloupe Extract Allergy Skin Test Itching    Mouth itching     Strawberry Extract Itching    Mouth itches   Citrullus Vulgaris Itching    Makes mouth itch , ALL melons    Nsaids Other (See Comments)    Avoid per nephrology   Prior to Admission medications   Medication Sig Start Date End Date Taking? Authorizing Provider  acetaminophen (TYLENOL) 500 MG tablet Take 1,000 mg by mouth every 6 (six) hours as needed for headache (pain).    [provider]  azelastine (ASTELIN) 0.1 % nasal spray Place 2 sprays into both nostrils 2 (two) times daily. Use in each nostril as directed Patient not taking: Reported on 02/26/2022 12/14/21   Hunsucker, Bonna Gains, MD  butalbital-acetaminophen-caffeine (FIORICET) [phone] MG tablet Take 1 tablet by mouth daily as needed for headache.    [provider]  calcitRIOL (ROCALTROL) 0.25 MCG capsule Take 0.25 mcg by  mouth every morning. 08/03/21   [provider]  Calcium Carbonate Antacid (TUMS PO) Take 2 tablets by mouth 3 (three) times daily with meals.    [provider]  carvedilol (COREG) 12.5 MG tablet Take 12.5 mg by mouth 2 (two) times daily with a meal.    [provider]  ferrous sulfate 325 (65 FE) MG tablet Take 325 mg by mouth 3 (three) times daily with meals.    [provider]  fluticasone (FLONASE) 50 MCG/ACT nasal spray Place 1 spray into both nostrils in the morning and at bedtime. Patient not  taking: Reported on 02/26/2022 12/14/21   Hunsucker, Bonna Gains, MD  hydrALAZINE (APRESOLINE) 100 MG tablet Take 100 mg by mouth 3 (three) times daily. 08/05/21   [provider]  insulin lispro (HUMALOG) 100 UNIT/ML injection Inject 7 Units into the skin once. 01/08/22   [provider]  magnesium oxide (MAG-OX) 400 MG tablet Take 400 mg by mouth daily. 08/03/21   [provider]  metoCLOPramide (REGLAN) 10 MG tablet Take 1 tablet (10 mg total) by mouth every 8 (eight) hours as needed for nausea. 01/17/22   Lucrezia Starch, MD  ondansetron (ZOFRAN) 4 MG tablet Take 4 mg by mouth every 8 (eight) hours as needed for nausea or vomiting.    [provider]  prochlorperazine (COMPAZINE) 5 MG tablet Take 1 tablet (5 mg total) by mouth every 6 (six) hours as needed for nausea or vomiting. 01/21/22   Regan Lemming, MD  sacubitril-valsartan (ENTRESTO) 49-51 MG Take 1 tablet by mouth every morning.    [provider]  Vitamin D, Ergocalciferol, (DRISDOL) 1.25 MG (50000 UNIT) CAPS capsule Take 50,000 Units by mouth every Sunday.    [provider]  escitalopram (LEXAPRO) 10 MG tablet Take 20 mg by mouth daily.  05/10/20 10/06/20  [provider]  furosemide (LASIX) 40 MG tablet Take 1 tablet (40 mg total) by mouth daily. Patient not taking: No sig reported 10/28/19 02/17/21  [name], MD  lisinopril (ZESTRIL) 10 MG tablet Take 10 mg by mouth daily.  02/23/20 10/06/20  [provider]  spironolactone (ALDACTONE) 25 MG tablet Take 25 mg by mouth daily.  04/20/20 10/06/20  [provider]   Current Facility-Administered Medications  Medication Dose Route Frequency Provider Last Rate Last Admin   acetaminophen (TYLENOL) tablet 650 mg  650 mg Oral Q6H PRN Kristopher Oppenheim, DO       Or   acetaminophen (TYLENOL) suppository 650 mg  650 mg Rectal Q6H PRN Kristopher Oppenheim, DO       butalbital-acetaminophen-caffeine (FIORICET) [phone] MG per tablet 1  tablet  1 tablet Oral Daily PRN Kristopher Oppenheim, DO   1 tablet at 03/16/22 0016   carvedilol (COREG) tablet 12.5 mg  12.5 mg Oral BID WC Kristopher Oppenheim, DO   12.5 mg at 03/16/22 0854   clonazePAM (KLONOPIN) tablet 0.5 mg  0.5 mg Oral BID [name], MD   0.5 mg at 03/16/22 1047   dextrose 5 % in lactated ringers infusion   Intravenous Continuous [name], MD       dextrose 50 % solution 0-50 mL  0-50 mL Intravenous PRN [name], MD       heparin injection 5,000 Units  5,000 Units Subcutaneous Q8H Kristopher Oppenheim, DO   5,000 Units at 03/16/22 3710   hydrALAZINE (APRESOLINE) tablet 100 mg  100 mg Oral TID Kristopher Oppenheim, DO   100 mg at  03/16/22 0854   insulin pump   Subcutaneous TID WC, HS, 0200 [name], MD       insulin regular, human (MYXREDLIN) 100 units/ 100 mL infusion   Intravenous Continuous Lala Lund K, MD 4.6 mL/hr at 03/16/22 1221 4.6 Units/hr at 03/16/22 1221   lactated ringers infusion   Intravenous Continuous [name], MD 125 mL/hr at 03/16/22 1048 New Bag at 03/16/22 1048   pantoprazole (PROTONIX) injection 40 mg  40 mg Intravenous Q12H [name], MD   40 mg at 03/16/22 0901   prochlorperazine (COMPAZINE) injection 5 mg  5 mg Intravenous Q6H PRN Kristopher Oppenheim, DO   5 mg at 03/16/22 0356   sacubitril-valsartan (ENTRESTO) 49-51 mg per tablet  1 tablet Oral BID [name], MD   1 tablet at 03/16/22 1048   Labs: Basic Metabolic Panel: Recent Labs  Lab 03/12/22 0440 03/15/22 1512 03/15/22 1655 03/16/22 0434  NA 138 132* 131* 133*  K 4.8 5.8* 6.2* 5.6*  CL 104 100  --  103  CO2 22 23  --  22  GLUCOSE 206* 242*  --  225*  BUN 69* 71*  --  69*  CREATININE 9.78* 10.55*  --  10.94*  CALCIUM 8.7* 8.2*  --  8.0*   Liver Function Tests: Recent Labs  Lab 03/12/22 0257 03/15/22 1512 03/16/22 0434  AST 18 15 11*  ALT 11 11 11   ALKPHOS 68 58 53  BILITOT 0.9 0.8 0.8  PROT 6.3* 6.1* 4.9*  ALBUMIN 3.0* 2.9* 2.4*   Recent Labs  Lab  03/12/22 0257 03/15/22 1512  LIPASE 20 22   No results for input(s): "AMMONIA" in the last 168 hours. CBC: Recent Labs  Lab 03/11/22 0413 03/12/22 0257 03/15/22 1512 03/15/22 1655  WBC 4.8 5.4 4.6  --   NEUTROABS 4.3 4.2 3.4  --   HGB 12.1 12.7 12.0 12.6  HCT 36.9 38.4 36.8 37.0  MCV 96.3 95.3 95.8  --   PLT 230 268 229  --    Cardiac Enzymes: No results for input(s): "CKTOTAL", "CKMB", "CKMBINDEX", "TROPONINI" in the last 168 hours. CBG: Recent Labs  Lab 03/15/22 2132 03/16/22 0012 03/16/22 0830 03/16/22 1053 03/16/22 1212  GLUCAP 128* 125* 348* 275* 274*   Iron Studies: No results for input(s): "IRON", "TIBC", "TRANSFERRIN", "FERRITIN" in the last 72 hours. Studies/Results: CT ABDOMEN PELVIS WO CONTRAST  Result Date: 03/15/2022 CLINICAL DATA:  Acute abdominal pain. EXAM: CT ABDOMEN AND PELVIS WITHOUT CONTRAST TECHNIQUE: Multidetector CT imaging of the abdomen and pelvis was performed following the standard protocol without IV contrast. RADIATION DOSE REDUCTION: This exam was performed according to the departmental dose-optimization program which includes automated exposure control, adjustment of the mA and/or kV according to patient size and/or use of iterative reconstruction technique. COMPARISON:  CT 01/21/2022 FINDINGS: Lower chest: Again seen cardiomegaly. Similar pericardial effusion. Slight improvement in basilar septal thickening. Chronic compressive atelectasis in the left lower lobe. Hepatobiliary: No evidence of focal hepatic abnormality on this unenhanced exam. Mild hepatic steatosis. Gallbladder physiologically distended, no calcified stone. No biliary dilatation. Pancreas: Parenchymal atrophy. Spleen: Normal in size without focal abnormality. Adrenals/Urinary Tract: No adrenal nodule. No hydronephrosis or renal calculi. Urinary bladder is partially distended, mild wall thickening, nonspecific. Stomach/Bowel: The stomach is decompressed. There is no evidence of bowel  obstruction. Free fluid in the abdomen which may be due to peritoneal dialysis limits detailed bowel assessment. High-density material within the colon consistent with ingested material, possibly bismuth  containing products. No obvious colonic inflammation. Normal appendix is visualized Vascular/Lymphatic: Normal caliber abdominal aorta. No bulky abdominopelvic adenopathy. Reproductive: Anteverted uterus.  No obvious adnexal mass. Other: Peritoneal dialysis catheter is coiled in the pelvis. Similar degree of free fluid throughout the abdomen and pelvis to prior exam, possibly related to peritoneal dialysis. No evidence of loculated fluid collection or free air. There is moderate generalized subcutaneous edema Musculoskeletal: There are no acute or suspicious osseous abnormalities. IMPRESSION: 1. No acute abnormality in the abdomen/pelvis. 2. Peritoneal dialysis catheter coiled in the pelvis. Similar degree of free fluid throughout the abdomen and pelvis, possibly related to peritoneal dialysis. 3. Mild hepatic steatosis. 4. Cardiomegaly with small pericardial effusion is well as generalized body wall edema, similar to prior exam. Electronically Signed   By: Keith Rake M.D.   On: 03/15/2022 17:42     Physical Exam: Vitals:   03/15/22 2357 03/16/22 0400 03/16/22 0500 03/16/22 0800  BP: (!) 154/124 (!) 135/111  (!) 132/94  Pulse: (!) 110 94  (!) 103  Resp:  17  18  Temp:  98.1 F (36.7 C)  98.3 F (36.8 C)  TempSrc:  Oral  Oral  SpO2:  99%  94%  Weight:   63.3 kg   Height:         General: WDWN NAD Head: Sclera anicteric Lungs: CTA bilaterally. No wheeze, rales or rhonchi. Breathing is unlabored. Heart: RRR. No murmur, rubs or gallops.  Abdomen: Soft and non-tender; PD catheter RUQ: exit site CDI Lower extremities: No edema BLLE Neuro: AAOx3. Moves all extremities spontaneously Dialysis Access:  Dialysis Orders:  [place] Nephrologist: Dr. Fatima Sanger 4 exchanges  overnight No day dwell  178ml fill-per patient, recently raised 03/10/22 Per patient, uses 2.5% and 4.25% bag and alternates. Will obtain PD records from outpatient center  Assessment/Plan: Ongoing nausea/vomiting and ABD pain-noted she's been seen by [place] previously and apparently this has been an ongoing issue. I do not suspect peritonitis at this point. ABD is soft and non-tender, PD cath exit site CDI, she remains afebrile, and WBC is stable. Hold off with doing cell count and PD fluid culture for now and will discuss further with Dr. Marval Regal. ESRD - on PD since this past year. She reports her fill has been raised to 1710ml last week, and since then, has not been able to tolerate this, was able to tolerate 1575ml previous prescribed. Given ongoing ABD pain, I will order 1523ml fill and to use all 1.5% bags. Chronic Systolic CHF-stable, euvolemic on exam, continue Coreg and Entresto Hypertension/volume  - Blood pressures are stable and remains euvolemic on exam Anemia of CKD - Hgb 12. No ESA/Fe indicated at this time Secondary Hyperparathyroidism -  Corr Ca ok, will check PO4 in AM Nutrition - Renal diet with fluid restriction Depression/Suicidal Ideation-managed by primary, psych consulted Uncontrolled T1DM-managed by primary  Tobie Poet, NP [facility] 03/16/2022, 12:46 PM

## 2022-03-16 NOTE — Discharge Summary (Signed)
Kathy Lewis ULA:[id number] DOB: Jan 28, 1996 DOA: 03/15/2022  PCP: [facility], [facility]  Admit date: 03/15/2022  Discharge date: 03/16/2022  Admitted From: Home   Disposition:  Home   Recommendations for Outpatient Follow-up:   Follow up with PCP in 1-2 weeks  PCP Please obtain BMP/CBC, 2 view CXR in 1week,  (see Discharge instructions)   PCP Please follow up on the following pending results: Please arrange for outpatient GI and psychiatry follow-up.   Home Health: None   Equipment/Devices: None  Consultations: Psych, Renal Discharge Condition: Stable    CODE STATUS: Full    Diet Recommendation- Renal - low carbohydrate diet with 1.5 L fluid restriction per day.    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  She states that she has been having this problem for the last 2 months.  She was admitted to hospital last month for DKA and abdominal pain.  Gastric emptying study was negative for any slow gastric emptying, she now comes back with ongoing upper abdominal discomfort and nausea vomiting.                                                                 Hospital Course     Recurrent nausea vomiting with some nonspecific abdominal pain.  Has been an ongoing problem, CT abdomen pelvis done this admission is unremarkable, recently had gastric emptying study which was unremarkable as well, with supportive care seems to be  improving, she does appear to have some gastritis as her symptoms are much improved after Protonix which will be continued, she is now tolerating diet without any nausea or vomiting, will be discharged home with outpatient PCP follow-up, will request PCP to arrange for outpatient GI follow-up.   Flat affect with some suicidal ideation.  Likely has underlying depression, seen by psychiatry placed on Klonopin for anxiety, currently not suicidal homicidal, cleared by psychiatry for home discharge, request PCP to kindly arrange for outpatient psych follow-up.   Abdominal pain of unknown cause Chronic.  CT scan is negative.  Gastric empty study done last month which was negative.  Currently pain is better continue to monitor.   ESRD (end stage renal disease) ([place]) - on peritoneal dialysis Is on peritoneal dialysis at home.  Nephrology was consulted but patient chooses to go home and do PD per home schedule.  Lokelma 1 dose given for hyperkalemia.   Essential hypertension Stable.  Continue Coreg 12.5  mg twice daily.  Hydralazine 100 mg 3 times daily, Entresto 45/51 daily.   Prolonged QT interval Chronic.  QTc approximately 500 to 510 ms.   Chronic systolic CHF (congestive heart failure) (HCC) Chronic.  Appears euvolemic.  Continue guideline directed medical therapy with Coreg 12.5 mg twice daily and Entresto daily.  Follow-up with PCP and primary cardiologist postdischarge.   Uncontrolled type 1 diabetes mellitus with hyperglycemia, with long-term current use of insulin (HCC) Stable.  Patient's on OmniPod subcutaneous insulin pump.  Resume pump has been provided with diabetic education.  Poor outpatient glycemic control as evident by high A1c.  Requested to resume home regimen and follow-up with PCP for any further adjustment.  Lab Results  Component Value Date   HGBA1C 9.2 (H) 11/23/2021     Discharge diagnosis     Principal Problem:   Nausea & vomiting Active Problems:   Chronic  systolic CHF (congestive heart failure) (HCC)   Prolonged QT interval   Essential hypertension   ESRD (end stage renal disease) ([place]) - on peritoneal dialysis   Abdominal pain of unknown cause   Uncontrolled type 1 diabetes mellitus with hyperglycemia, with long-term current use of insulin [facility])    Discharge instructions    Discharge Instructions     Discharge instructions   Complete by: As directed    Follow with Primary MD [facility], [place] in 7 days resume your home peritoneal dialysis regimen immediately.  Resume your home insulin pump immediately.  Get CBC, CMP, 2 view Chest X ray -  checked next visit within 1 week by Primary MD, please get a referral for outpatient psychiatrist follow-up for anxiety within a week.  Activity: As tolerated with Full fall precautions use walker/cane & assistance as needed  Disposition Home    Diet: Renal-low carbohydrate diet with 1.5 L fluid restriction per day, check CBGs as per your home schedule and monitor closely.  Special Instructions: If you have smoked or chewed Tobacco  in the last 2 yrs please stop smoking, stop any regular Alcohol  and or any Recreational drug use.  On your next visit with your primary care physician please Get Medicines reviewed and adjusted.  Please request your Prim.MD to go over all Hospital Tests and Procedure/Radiological results at the follow up, please get all Hospital records sent to your Prim MD by signing hospital release before you go home.  If you experience worsening of your admission symptoms, develop shortness of breath, life threatening emergency, suicidal or homicidal thoughts you must seek medical attention immediately by calling 911 or calling your MD immediately  if symptoms less severe.  You Must read complete instructions/literature along with all the possible adverse reactions/side effects for all the Medicines you take and that have been prescribed to you. Take any new  Medicines after you have completely understood and accpet all the possible adverse reactions/side effects.   Increase activity slowly   Complete by: As directed    No wound care   Complete by: As directed        Discharge Medications   Allergies as of 03/16/2022       Reactions   Cantaloupe Extract Allergy Skin Test Itching   Mouth itching   Strawberry Extract Itching   Mouth itches   Citrullus Vulgaris Itching   Makes mouth itch , ALL melons   Nsaids Other (See Comments)   Avoid per nephrology        Medication List     STOP  taking these medications    azelastine 0.1 % nasal spray Commonly known as: ASTELIN   fluticasone 50 MCG/ACT nasal spray Commonly known as: FLONASE       TAKE these medications    acetaminophen 500 MG tablet Commonly known as: TYLENOL Take 1,000 mg by mouth every 6 (six) hours as needed for headache (pain).   butalbital-acetaminophen-caffeine 50-325-40 MG tablet Commonly known as: FIORICET Take 1 tablet by mouth daily as needed for headache.   calcitRIOL 0.25 MCG capsule Commonly known as: ROCALTROL Take 0.25 mcg by mouth every morning.   carvedilol 12.5 MG tablet Commonly known as: COREG Take 12.5 mg by mouth 2 (two) times daily with a meal.   clonazePAM 0.5 MG tablet Commonly known as: KLONOPIN Take 1 tablet (0.5 mg total) by mouth 2 (two) times daily as needed for anxiety.   Entresto 49-51 MG Generic drug: sacubitril-valsartan Take 1 tablet by mouth every morning.   ferrous sulfate 325 (65 FE) MG tablet Take 325 mg by mouth 3 (three) times daily with meals.   hydrALAZINE 100 MG tablet Commonly known as: APRESOLINE Take 100 mg by mouth 3 (three) times daily.   insulin lispro 100 UNIT/ML injection Commonly known as: HUMALOG Inject 7 Units into the skin once.   magnesium oxide 400 MG tablet Commonly known as: MAG-OX Take 400 mg by mouth daily.   metoCLOPramide 10 MG tablet Commonly known as: REGLAN Take 1 tablet  (10 mg total) by mouth every 8 (eight) hours as needed for nausea.   ondansetron 4 MG tablet Commonly known as: ZOFRAN Take 4 mg by mouth every 8 (eight) hours as needed for nausea or vomiting.   pantoprazole 40 MG tablet Commonly known as: Protonix Take 1 tablet (40 mg total) by mouth daily.   prochlorperazine 5 MG tablet Commonly known as: COMPAZINE Take 1 tablet (5 mg total) by mouth every 6 (six) hours as needed for nausea or vomiting.   TUMS PO Take 2 tablets by mouth 3 (three) times daily with meals.   Vitamin D (Ergocalciferol) 1.25 MG (50000 UNIT) Caps capsule Commonly known as: DRISDOL Take 50,000 Units by mouth every Sunday.         Follow-up Information     [facility]. Schedule an appointment as soon as possible for a visit in 1 week(s).   Specialty: Family Medicine                Major procedures and Radiology Reports - PLEASE review detailed and final reports thoroughly  -       CT ABDOMEN PELVIS WO CONTRAST  Result Date: 03/15/2022 CLINICAL DATA:  Acute abdominal pain. EXAM: CT ABDOMEN AND PELVIS WITHOUT CONTRAST TECHNIQUE: Multidetector CT imaging of the abdomen and pelvis was performed following the standard protocol without IV contrast. RADIATION DOSE REDUCTION: This exam was performed according to the departmental dose-optimization program which includes automated exposure control, adjustment of the mA and/or kV according to patient size and/or use of iterative reconstruction technique. COMPARISON:  CT 01/21/2022 FINDINGS: Lower chest: Again seen cardiomegaly. Similar pericardial effusion. Slight improvement in basilar septal thickening. Chronic compressive atelectasis in the left lower lobe. Hepatobiliary: No evidence of focal hepatic abnormality on this unenhanced exam. Mild hepatic steatosis. Gallbladder physiologically distended, no calcified stone. No biliary dilatation. Pancreas: Parenchymal atrophy. Spleen: Normal in  size without focal abnormality. Adrenals/Urinary Tract: No adrenal nodule. No hydronephrosis or renal calculi. Urinary bladder is partially distended, mild wall thickening, nonspecific. Stomach/Bowel: The stomach  is decompressed. There is no evidence of bowel obstruction. Free fluid in the abdomen which may be due to peritoneal dialysis limits detailed bowel assessment. High-density material within the colon consistent with ingested material, possibly bismuth containing products. No obvious colonic inflammation. Normal appendix is visualized Vascular/Lymphatic: Normal caliber abdominal aorta. No bulky abdominopelvic adenopathy. Reproductive: Anteverted uterus.  No obvious adnexal mass. Other: Peritoneal dialysis catheter is coiled in the pelvis. Similar degree of free fluid throughout the abdomen and pelvis to prior exam, possibly related to peritoneal dialysis. No evidence of loculated fluid collection or free air. There is moderate generalized subcutaneous edema Musculoskeletal: There are no acute or suspicious osseous abnormalities. IMPRESSION: 1. No acute abnormality in the abdomen/pelvis. 2. Peritoneal dialysis catheter coiled in the pelvis. Similar degree of free fluid throughout the abdomen and pelvis, possibly related to peritoneal dialysis. 3. Mild hepatic steatosis. 4. Cardiomegaly with small pericardial effusion is well as generalized body wall edema, similar to prior exam. Electronically Signed   By: Keith Rake M.D.   On: 03/15/2022 17:42   DG CHEST PORT 1 VIEW  Result Date: 02/26/2022 CLINICAL DATA:  End-stage renal disease. Abdominal pain. Patient is on peritoneal dialysis. EXAM: PORTABLE CHEST 1 VIEW COMPARISON:  One-view abdomen 01/30/2022 FINDINGS: Heart is enlarged. Persistent pericardial effusion suspected. No edema or pleural effusion is present. No focal airspace disease is present. The visualized soft tissues and bony thorax are unremarkable. IMPRESSION: 1. Cardiomegaly without  failure. 2. Probable pericardial effusion, chronic. 3. No acute cardiopulmonary disease. Electronically Signed   By: San Morelle M.D.   On: 02/26/2022 12:26    Micro Results     Today   Subjective    Anberlin Diez today has no headache,no chest abdominal pain,no new weakness tingling or numbness, feels much better wants to go home today.   Objective   Blood pressure (!) 132/94, pulse (!) 103, temperature 98.3 F (36.8 C), temperature source Oral, resp. rate 18, height 5' (1.524 m), weight 63.3 kg, SpO2 94 %.   Intake/Output Summary (Last 24 hours) at 03/16/2022 1338 Last data filed at 03/16/2022 0230 Gross per 24 hour  Intake 240 ml  Output --  Net 240 ml    Exam  Awake Alert, No new F.N deficits,    [place].AT,PERRAL Supple Neck,   Symmetrical Chest wall movement, Good air movement bilaterally, CTAB RRR,No Gallops,   +ve B.Sounds, Abd Soft, Non tender,  No Cyanosis, Clubbing or edema    Data Review   Recent Labs  Lab 03/11/22 0413 03/12/22 0257 03/15/22 1512 03/15/22 1655  WBC 4.8 5.4 4.6  --   HGB 12.1 12.7 12.0 12.6  HCT 36.9 38.4 36.8 37.0  PLT 230 268 229  --   MCV 96.3 95.3 95.8  --   MCH 31.6 31.5 31.3  --   MCHC 32.8 33.1 32.6  --   RDW 14.4 14.3 13.9  --   LYMPHSABS 0.4* 0.7 0.9  --   MONOABS 0.1 0.2 0.2  --   EOSABS 0.0 0.1 0.1  --   BASOSABS 0.0 0.1 0.0  --     Recent Labs  Lab 03/11/22 0413 03/12/22 0257 03/12/22 0440 03/15/22 1512 03/15/22 1655 03/16/22 0434  NA 140 141 138 132* 131* 133*  K 4.6 4.7 4.8 5.8* 6.2* 5.6*  CL 106 100 104 100  --  103  CO2 25 22 22 23   --  22  GLUCOSE 167* 194* 206* 242*  --  225*  BUN 68* 67*  69* 71*  --  69*  CREATININE 9.36* 9.80* 9.78* 10.55*  --  10.94*  CALCIUM 8.1* 9.0 8.7* 8.2*  --  8.0*  AST 20 18  --  15  --  11*  ALT 17 11  --  11  --  11  ALKPHOS 62 68  --  58  --  53  BILITOT 0.7 0.9  --  0.8  --  0.8  ALBUMIN 2.8* 3.0*  --  2.9*  --  2.4*  MG  --   --   --  2.6*  --  2.6*     Total Time in preparing paper work, data evaluation and todays exam - 35 minutes  Lala Lund M.D on 03/16/2022 at 1:38 PM  [facility]

## 2022-03-16 NOTE — Consult Note (Signed)
[facility] New Face-to-Face Psychiatric Evaluation   Service Date: March 16, 2022 LOS:  LOS: 0 days    Assessment  Kathy Lewis is a 26 y.o. female admitted medically for 03/15/2022  4:02 PM for chronic abdominal pain, nausea, vomiting. She carries the psychiatric diagnoses of ?Bipolar 2 disorder and has a past medical history of type 1 diabetes, ESRD on peritoneal dialysis, hypertension, postpartum cardiomyopathy ejection fraction of 20%. Psychiatry was consulted for suicidal ideation by Dr. Candiss Norse.    Her current presentation of low mood, decreased energy and concentration, insomnia, and passive SI is most consistent with a depressive episode.  While she has been diagnosed with bipolar 2 disorder in the past, it is unclear if this is the appropriate diagnosis as she also had a history of being prescribed Lexapro and it is not certain if she has had a hypomanic episode in the past.  She historically has had a fair response to Lexapro.  Patient has severe medical comorbidities involving her kidney and heart which makes medication management complex.  We will continue communicating with the patient's pharmacist on which psychiatric medications would be best for her underlying medical conditions.  At this time patient is on Klonopin per primary team.  Continue to monitor for worsening mood as patient is currently reporting passive SI due to the chronic nature of her medical conditions.  Will reassess and if sleep continues to worsen tomorrow, we will consider adding trazodone.  This medication has a risk of QT prolongation and arrhythmias so we want to weigh the risks and benefits. On initial examination, patient is agreeable to medications. Please see plan below for detailed recommendations.   Diagnoses:  Active Hospital problems: Principal Problem:   Nausea & vomiting Active Problems:   Chronic systolic CHF (congestive heart failure) (HCC)   Prolonged QT interval   Essential  hypertension   ESRD (end stage renal disease) (HCC) - on peritoneal dialysis   Abdominal pain of unknown cause   Uncontrolled type 1 diabetes mellitus with hyperglycemia, with long-term current use of insulin ([place])     Plan  ## Safety and Observation Level:  - Based on my clinical evaluation, I estimate the patient to be at moderate risk of self harm in the current setting - At this time, we recommend a moderate level of observation. This decision is based on my review of the chart including patient's history and current presentation, interview of the patient, mental status examination, and consideration of suicide risk including evaluating suicidal ideation, plan, intent, suicidal or self-harm behaviors, risk factors, and protective factors. This judgment is based on our ability to directly address suicide risk, implement suicide prevention strategies and develop a safety plan while the patient is in the clinical setting. Please contact our team if there is a concern that risk level has changed.   ## Medications:  -- No new medication recommendations at this time.  Will reassess tomorrow and if sleep worsens can consider starting trazodone   ## Medical Decision Making Capacity:  Not formally assessed  ## Further Work-up:     -- most recent EKG on 7/19 had QtC of 505 -- Pertinent labwork reviewed earlier this admission includes:  Cr 10.94    Thank you for this consult request. Recommendations have been communicated to the primary team.  We will continue to follow at this time.   Stormy Fabian, MD   NEW history  Relevant Aspects of Hospital Course:  Admitted on 03/15/2022 for nausea and  vomiting.  Patient Report:  Patient was seen in bed this morning.  Patient reports having low mood due to her medical conditions.  She says "I sometimes think why do I have to keep doing this".  She denies having suicidal intent or plan.  She reports having difficulty sleeping due to having panic  attacks.  She notes the first panic attack occurred 2 days ago where her "body was shaking".  She reports one of her stressors is not knowing what the future holds.  Specifically, she is unsure if she will be able to be on the heart transplant list.  She reports being diagnosed with bipolar 2 disorder in 2018 by a psychiatrist in [place].  She was taking Lexapro but feels that she did not notice a change.  She used to have a therapist but it became too expensive so she is not currently seeing a therapist.  She denies psychiatric facility hospitalizations.   Psychiatric History:  Bipolar 2 disorder   Social History:  Patient lives alone in [place] but her mother, father, daughter's dad is her support system.  She has a 40-year-old daughter.   Family History:   The patient's family history is not on file. She was adopted.  Medical History: Past Medical History:  Diagnosis Date   Anxiety    Bipolar 2 disorder ([place])    Chronic systolic (congestive) heart failure (HCC)    Depression    DKA (diabetic ketoacidoses)    ESRD on peritoneal dialysis ([place])    HSV infection    on valtrex   Hypokalemia    Leukocytosis    Migraine    Noncompliance with medication regimen    Preeclampsia    Severe anemia    Type 1 diabetes mellitus ([place])     Surgical History: Past Surgical History:  Procedure Laterality Date   CARDIAC CATHETERIZATION     DILATION AND EVACUATION N/A 10/22/2019   Procedure: ULTRASOUND GUIDED DILATATION AND EVACUATION;  Surgeon: [name], MD;  Location: [facility] LD ORS;  Service: Gynecology;  Laterality: N/A;   NO PAST SURGERIES     peritoneal dialysis catheter insertion     RENAL BIOPSY      Medications:   Current Facility-Administered Medications:    acetaminophen (TYLENOL) tablet 650 mg, 650 mg, Oral, Q6H PRN **OR** acetaminophen (TYLENOL) suppository 650 mg, 650 mg, Rectal, Q6H PRN, Kristopher Oppenheim, DO   butalbital-acetaminophen-caffeine (FIORICET) [phone]  MG per tablet 1 tablet, 1 tablet, Oral, Daily PRN, Kristopher Oppenheim, DO, 1 tablet at 03/16/22 0016   carvedilol (COREG) tablet 12.5 mg, 12.5 mg, Oral, BID WC, Kristopher Oppenheim, DO, 12.5 mg at 03/16/22 5573   clonazePAM (KLONOPIN) tablet 0.5 mg, 0.5 mg, Oral, BID, Lala Lund K, MD, 0.5 mg at 03/16/22 1047   dextrose 5 % in lactated ringers infusion, , Intravenous, Continuous, Singh, Prashant K, MD   dextrose 50 % solution 0-50 mL, 0-50 mL, Intravenous, PRN, [name], MD   gentamicin cream (GARAMYCIN) 0.1 % 1 Application, 1 Application, Topical, Daily, Tobie Poet E, NP   heparin injection 5,000 Units, 5,000 Units, Subcutaneous, Q8H, Kristopher Oppenheim, DO, 5,000 Units at 03/16/22 2202   hydrALAZINE (APRESOLINE) tablet 100 mg, 100 mg, Oral, TID, Kristopher Oppenheim, DO, 100 mg at 03/16/22 0854   insulin pump, , Subcutaneous, TID WC, HS, 0200, Singh, Prashant K, MD   insulin regular, human (MYXREDLIN) 100 units/ 100 mL infusion, , Intravenous, Continuous, [name], MD, Last Rate: 3.6 mL/hr at 03/16/22 1248, 3.6 Units/hr  at 03/16/22 1248   lactated ringers infusion, , Intravenous, Continuous, [name], MD, Last Rate: 125 mL/hr at 03/16/22 1048, New Bag at 03/16/22 1048   pantoprazole (PROTONIX) injection 40 mg, 40 mg, Intravenous, Q12H, [name], MD, 40 mg at 03/16/22 0901   prochlorperazine (COMPAZINE) injection 5 mg, 5 mg, Intravenous, Q6H PRN, Kristopher Oppenheim, DO, 5 mg at 03/16/22 0356   sacubitril-valsartan (ENTRESTO) 49-51 mg per tablet, 1 tablet, Oral, BID, [name], MD, 1 tablet at 03/16/22 1048  Allergies: Allergies  Allergen Reactions   Cantaloupe Extract Allergy Skin Test Itching    Mouth itching     Strawberry Extract Itching    Mouth itches   Citrullus Vulgaris Itching    Makes mouth itch , ALL melons    Nsaids Other (See Comments)    Avoid per nephrology       Objective  Vital signs:  Temp:  [97.8 F (36.6 C)-98.7 F (37.1 C)] 98.3 F (36.8 C)  (07/20 0800) Pulse Rate:  [94-110] 103 (07/20 0800) Resp:  [10-20] 18 (07/20 0800) BP: (132-154)/(94-124) 132/94 (07/20 0800) SpO2:  [94 %-100 %] 94 % (07/20 0800) Weight:  [61.4 kg-65.3 kg] 63.3 kg (07/20 0500)  Psychiatric Specialty Exam:  Presentation  General Appearance: Appropriate for Environment  Eye Contact:Good  Speech:Clear and Coherent  Speech Volume:Normal  Handedness:No data recorded  Mood and Affect  Mood:Depressed  Affect:Restricted   Thought Process  Thought Processes:Linear  Descriptions of Associations:Intact  Orientation:Full (Time, Place and Person)  Thought Content:Logical  History of Schizophrenia/Schizoaffective disorder:No data recorded Duration of Psychotic Symptoms:No data recorded Hallucinations:Hallucinations: None  Ideas of Reference:None  Suicidal Thoughts:Suicidal Thoughts: Yes, Passive SI Passive Intent and/or Plan: Without Intent; Without Plan; Without Means to Carry Out  Homicidal Thoughts:Homicidal Thoughts: No   Sensorium  Memory:Immediate Good; Recent Good; Remote Good  Judgment:Fair  Insight:Fair   Executive Functions  Concentration:Good  Attention Span:Good  [place]  Language:Good   Psychomotor Activity  Psychomotor Activity:Psychomotor Activity: Normal   Assets  Assets:No data recorded  Sleep  Sleep:Sleep: Fair    Physical Exam: Physical Exam Constitutional:      Appearance: Normal appearance. She is normal weight.  Cardiovascular:     Rate and Rhythm: Tachycardia present.  Pulmonary:     Effort: Pulmonary effort is normal.  Neurological:     Mental Status: She is alert and oriented to person, place, and time.    Review of Systems  Constitutional:  Negative for chills and fever.  Neurological:  Negative for tremors.  Psychiatric/Behavioral:  Positive for depression and suicidal ideas. Negative for hallucinations, memory loss and substance abuse. The patient  has insomnia. The patient is not nervous/anxious.    Blood pressure (!) 132/94, pulse (!) 103, temperature 98.3 F (36.8 C), temperature source Oral, resp. rate 18, height 5' (1.524 m), weight 63.3 kg, SpO2 94 %. Body mass index is 27.25 kg/m.

## 2022-03-16 NOTE — Plan of Care (Signed)
Problem: Education: Goal: Knowledge of General Education information will improve Description: Including pain rating scale, medication(s)/side effects and non-pharmacologic comfort measures 03/16/2022 1349 by Vonna Kotyk, RN Outcome: Adequate for Discharge 03/16/2022 0736 by Vonna Kotyk, RN Outcome: Progressing   Problem: Health Behavior/Discharge Planning: Goal: Ability to manage health-related needs will improve 03/16/2022 1349 by Vonna Kotyk, RN Outcome: Adequate for Discharge 03/16/2022 0736 by Vonna Kotyk, RN Outcome: Progressing   Problem: Clinical Measurements: Goal: Ability to maintain clinical measurements within normal limits will improve 03/16/2022 1349 by Vonna Kotyk, RN Outcome: Adequate for Discharge 03/16/2022 0736 by Vonna Kotyk, RN Outcome: Progressing Goal: Will remain free from infection 03/16/2022 1349 by Vonna Kotyk, RN Outcome: Adequate for Discharge 03/16/2022 0736 by Vonna Kotyk, RN Outcome: Progressing Goal: Diagnostic test results will improve 03/16/2022 1349 by Vonna Kotyk, RN Outcome: Adequate for Discharge 03/16/2022 0736 by Vonna Kotyk, RN Outcome: Progressing Goal: Respiratory complications will improve 03/16/2022 1349 by Vonna Kotyk, RN Outcome: Adequate for Discharge 03/16/2022 0736 by Vonna Kotyk, RN Outcome: Progressing Goal: Cardiovascular complication will be avoided 03/16/2022 1349 by Vonna Kotyk, RN Outcome: Adequate for Discharge 03/16/2022 0736 by Vonna Kotyk, RN Outcome: Progressing   Problem: Activity: Goal: Risk for activity intolerance will decrease 03/16/2022 1349 by Vonna Kotyk, RN Outcome: Adequate for Discharge 03/16/2022 0736 by Vonna Kotyk, RN Outcome: Progressing   Problem: Nutrition: Goal: Adequate nutrition will be maintained 03/16/2022 1349 by Vonna Kotyk, RN Outcome: Adequate for Discharge 03/16/2022 0736 by Vonna Kotyk, RN Outcome: Progressing   Problem: Coping: Goal: Level of anxiety  will decrease 03/16/2022 1349 by Vonna Kotyk, RN Outcome: Adequate for Discharge 03/16/2022 0736 by Vonna Kotyk, RN Outcome: Progressing   Problem: Elimination: Goal: Will not experience complications related to bowel motility 03/16/2022 1349 by Vonna Kotyk, RN Outcome: Adequate for Discharge 03/16/2022 0736 by Vonna Kotyk, RN Outcome: Progressing Goal: Will not experience complications related to urinary retention 03/16/2022 1349 by Vonna Kotyk, RN Outcome: Adequate for Discharge 03/16/2022 0736 by Vonna Kotyk, RN Outcome: Progressing   Problem: Pain Managment: Goal: General experience of comfort will improve 03/16/2022 1349 by Vonna Kotyk, RN Outcome: Adequate for Discharge 03/16/2022 0736 by Vonna Kotyk, RN Outcome: Progressing   Problem: Safety: Goal: Ability to remain free from injury will improve 03/16/2022 1349 by Vonna Kotyk, RN Outcome: Adequate for Discharge 03/16/2022 0736 by Vonna Kotyk, RN Outcome: Progressing   Problem: Skin Integrity: Goal: Risk for impaired skin integrity will decrease 03/16/2022 1349 by Vonna Kotyk, RN Outcome: Adequate for Discharge 03/16/2022 0736 by Vonna Kotyk, RN Outcome: Progressing   Problem: Education: Goal: Ability to describe self-care measures that may prevent or decrease complications (Diabetes Survival Skills Education) will improve 03/16/2022 1349 by Vonna Kotyk, RN Outcome: Adequate for Discharge 03/16/2022 0736 by Vonna Kotyk, RN Outcome: Progressing Goal: Individualized Educational Video(s) 03/16/2022 1349 by Vonna Kotyk, RN Outcome: Adequate for Discharge 03/16/2022 0736 by Vonna Kotyk, RN Outcome: Progressing   Problem: Coping: Goal: Ability to adjust to condition or change in health will improve 03/16/2022 1349 by Vonna Kotyk, RN Outcome: Adequate for Discharge 03/16/2022 0736 by Vonna Kotyk, RN Outcome: Progressing   Problem: Fluid Volume: Goal: Ability to maintain a balanced intake and  output will improve 03/16/2022 1349 by Vonna Kotyk, RN Outcome: Adequate for Discharge 03/16/2022 0736 by Karle Barr,  Bhavya Eschete A, RN Outcome: Progressing   Problem: Health Behavior/Discharge Planning: Goal: Ability to identify and utilize available resources and services will improve 03/16/2022 1349 by Vonna Kotyk, RN Outcome: Adequate for Discharge 03/16/2022 0736 by Vonna Kotyk, RN Outcome: Progressing Goal: Ability to manage health-related needs will improve 03/16/2022 1349 by Vonna Kotyk, RN Outcome: Adequate for Discharge 03/16/2022 0736 by Vonna Kotyk, RN Outcome: Progressing   Problem: Metabolic: Goal: Ability to maintain appropriate glucose levels will improve 03/16/2022 1349 by Vonna Kotyk, RN Outcome: Adequate for Discharge 03/16/2022 0736 by Vonna Kotyk, RN Outcome: Progressing   Problem: Nutritional: Goal: Maintenance of adequate nutrition will improve 03/16/2022 1349 by Vonna Kotyk, RN Outcome: Adequate for Discharge 03/16/2022 0736 by Vonna Kotyk, RN Outcome: Progressing Goal: Progress toward achieving an optimal weight will improve 03/16/2022 1349 by Vonna Kotyk, RN Outcome: Adequate for Discharge 03/16/2022 0736 by Vonna Kotyk, RN Outcome: Progressing   Problem: Skin Integrity: Goal: Risk for impaired skin integrity will decrease 03/16/2022 1349 by Vonna Kotyk, RN Outcome: Adequate for Discharge 03/16/2022 0736 by Vonna Kotyk, RN Outcome: Progressing   Problem: Tissue Perfusion: Goal: Adequacy of tissue perfusion will improve 03/16/2022 1349 by Vonna Kotyk, RN Outcome: Adequate for Discharge 03/16/2022 0736 by Vonna Kotyk, RN Outcome: Progressing

## 2022-03-16 NOTE — Progress Notes (Signed)
  Transition of Care [facility]) Screening Note   Patient Details  Name: Kathy Lewis Date of Birth: 1996-06-17   Transition of Care [facility]) CM/SW Contact:    Cyndi Bender, RN Phone Number: 03/16/2022, 9:20 AM    Transition of Care Department [facility]) has reviewed patient and no TOC needs have been identified at this time. We will continue to monitor patient advancement through interdisciplinary progression rounds. If new patient transition needs arise, please place a TOC consult.

## 2022-03-16 NOTE — Progress Notes (Signed)
Discharge paperwork reviewed with client at this time. No further requests have been made at this time. Patients mother has arrived to transfer patient home.

## 2022-03-16 NOTE — Progress Notes (Signed)
PROGRESS NOTE                                                                                                                                                                                                             Patient Demographics:    Kathy Lewis, is a 26 y.o. 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  She states that she has been having this problem for the last 2 months.  She was admitted to hospital last month for DKA and abdominal pain.  Gastric emptying study was negative for any slow gastric emptying, she now comes back with ongoing upper abdominal discomfort and nausea vomiting.   Subjective:    [name] today has, No headache, No chest pain, No abdominal pain - improving nausea, No new weakness tingling or numbness, no SOB.  Expressed some suicidal ideation to the nursing staff and then confirmed with me.   Assessment  & Plan :   Recurrent nausea vomiting with some nonspecific abdominal pain.  Has been an ongoing problem, CT abdomen pelvis done this admission is unremarkable, recently had gastric emptying study which was unremarkable as well, with supportive care seems to be improving, she does appear to have some gastritis as her symptoms are much improved after Protonix which will be continued, advance diet, if stable outpatient GI follow-up.  Flat affect with some suicidal ideation.  Likely has underlying depression, no plan but  feels like she has no worth living anymore, will request psych to evaluate.  Placed on low-dose Klonopin, patient is cooperative.  Abdominal pain of unknown cause Chronic.  CT scan is negative.  Gastric empty study done last month which was negative.  Currently pain is better continue to monitor.  ESRD (end stage renal disease) ([place]) - on peritoneal dialysis Is on peritoneal dialysis at home.  Nephrology  consulted.  Essential hypertension Stable.  Continue Coreg 12.5 mg twice daily.  Hydralazine 100 mg 3 times daily, Entresto 45/51 daily.  Prolonged QT interval Chronic.  QTc approximately 500 to 510 ms.  Chronic systolic CHF (congestive heart failure) (HCC) Chronic.  Appears euvolemic.  Continue guideline directed medical therapy with Coreg 12.5 mg twice daily and Entresto daily.  Uncontrolled type 1 diabetes mellitus with hyperglycemia, with long-term current use of insulin (HCC) Stable.  Patient's on OmniPod subcutaneous insulin pump.  Resume pump with q. ACHS sliding scale.  Diabetic education.  Poor outpatient glycemic control as evident by high A1c.  CBG (last 3)  Recent Labs    03/15/22 2132 03/16/22 0012 03/16/22 0830  GLUCAP 128* 125* 348*           Condition - Fair  Family Communication  : None present  Code Status : Full code  Consults  : Renal, psych  PUD Prophylaxis : PPI   Procedures  :     CT - 1. No acute abnormality in the abdomen/pelvis. 2. Peritoneal dialysis catheter coiled in the pelvis. Similar degree of free fluid throughout the abdomen and pelvis, possibly related to peritoneal dialysis. 3. Mild hepatic steatosis. 4. Cardiomegaly with small pericardial effusion is well as generalized body wall edema, similar to prior exam      Disposition Plan  :    Status is: Observation   DVT Prophylaxis  :    heparin injection 5,000 Units Start: 03/16/22 0600 SCDs Start: 03/16/22 0004  Lab Results  Component Value Date   PLT 229 03/15/2022     Diet :  Diet Order             Diet renal/carb modified with fluid restriction Diet-HS Snack? Nothing; Fluid restriction: 1200 mL Fluid; Room service appropriate? Yes; Fluid consistency: Thin  Diet effective now                    Inpatient Medications  Scheduled Meds:  carvedilol  12.5 mg Oral BID WC   clonazePAM  0.5 mg Oral BID   heparin  5,000 Units Subcutaneous Q8H   hydrALAZINE  100 mg Oral TID   insulin aspart  0-5 Units Subcutaneous QHS   insulin aspart  0-6 Units Subcutaneous TID WC   pantoprazole (PROTONIX) IV  40 mg Intravenous Q12H   sacubitril-valsartan  1 tablet Oral BID   Continuous Infusions: PRN Meds:.acetaminophen **OR** acetaminophen, butalbital-acetaminophen-caffeine, prochlorperazine  Antibiotics  :    Anti-infectives (From admission, onward)    None        Time Spent in minutes  30   Lala Lund M.D on 03/16/2022 at 9:37 AM  To page go to [email or web address]   [facility] -  Office  [phone]  See all Orders from today for further details    Objective:   Vitals:   03/15/22 2357 03/16/22 0400 03/16/22 0500 03/16/22 0800  BP: (!) 154/124 (!) 135/111  (!) 132/94  Pulse: (!) 110 94  (!) 103  Resp:  17  18  Temp:  98.1 F (36.7 C)  98.3 F (36.8 C)  TempSrc:  Oral  Oral  SpO2:  99%  94%  Weight:   63.3 kg   Height:        Wt Readings from Last 3 Encounters:  03/16/22 63.3 kg  03/12/22 65.3 kg  03/11/22 65.3 kg     Intake/Output Summary (Last 24 hours) at 03/16/2022 0962 Last data filed at 03/16/2022 0230 Gross  per 24 hour  Intake 240 ml  Output --  Net 240 ml     Physical Exam  Awake Alert, No new F.N deficits, flat affect [place].AT,PERRAL Supple Neck, No JVD,   Symmetrical Chest wall movement, Good air movement bilaterally, CTAB RRR,No Gallops,Rubs or new Murmurs,  +ve B.Sounds, Abd Soft, No tenderness,   No Cyanosis, Clubbing or edema     Data Review:    CBC Recent Labs  Lab 03/11/22 0413  03/12/22 0257 03/15/22 1512 03/15/22 1655  WBC 4.8 5.4 4.6  --   HGB 12.1 12.7 12.0 12.6  HCT 36.9 38.4 36.8 37.0  PLT 230 268 229  --   MCV 96.3 95.3 95.8  --   MCH 31.6 31.5 31.3  --   MCHC 32.8 33.1 32.6  --   RDW 14.4 14.3 13.9  --   LYMPHSABS 0.4* 0.7 0.9  --   MONOABS 0.1 0.2 0.2  --   EOSABS 0.0 0.1 0.1  --   BASOSABS 0.0 0.1 0.0  --     Electrolytes Recent Labs  Lab 03/11/22 0413 03/12/22 0257 03/12/22 0440 03/15/22 1512 03/15/22 1655 03/16/22 0434  NA 140 141 138 132* 131* 133*  K 4.6 4.7 4.8 5.8* 6.2* 5.6*  CL 106 100 104 100  --  103  CO2 25 22 22 23   --  22  GLUCOSE 167* 194* 206* 242*  --  225*  BUN 68* 67* 69* 71*  --  69*  CREATININE 9.36* 9.80* 9.78* 10.55*  --  10.94*  CALCIUM 8.1* 9.0 8.7* 8.2*  --  8.0*  AST 20 18  --  15  --  11*  ALT 17 11  --  11  --  11  ALKPHOS 62 68  --  58  --  53  BILITOT 0.7 0.9  --  0.8  --  0.8  ALBUMIN 2.8* 3.0*  --  2.9*  --  2.4*  MG  --   --   --  2.6*  --  2.6*    ------------------------------------------------------------------------------------------------------------------ No results for input(s): "CHOL", "HDL", "LDLCALC", "TRIG", "CHOLHDL", "LDLDIRECT" in the last 72 hours.  Lab Results  Component Value Date   HGBA1C 9.2 (H) 11/23/2021     Radiology Reports CT ABDOMEN PELVIS WO CONTRAST  Result Date: 03/15/2022 CLINICAL DATA:  Acute abdominal pain. EXAM: CT ABDOMEN AND PELVIS WITHOUT CONTRAST TECHNIQUE: Multidetector CT imaging of the abdomen and pelvis was performed following the standard protocol without IV contrast. RADIATION DOSE REDUCTION: This exam was performed according to the departmental dose-optimization program which includes automated exposure control, adjustment of the mA and/or kV according to patient size and/or use of iterative reconstruction technique. COMPARISON:  CT 01/21/2022 FINDINGS: Lower chest: Again seen cardiomegaly. Similar pericardial effusion. Slight improvement in basilar  septal thickening. Chronic compressive atelectasis in the left lower lobe. Hepatobiliary: No evidence of focal hepatic abnormality on this unenhanced exam. Mild hepatic steatosis. Gallbladder physiologically distended, no calcified stone. No biliary dilatation. Pancreas: Parenchymal atrophy. Spleen: Normal in size without focal abnormality. Adrenals/Urinary Tract: No adrenal nodule. No hydronephrosis or renal calculi. Urinary bladder is partially distended, mild wall thickening, nonspecific. Stomach/Bowel: The stomach is decompressed. There is no evidence of bowel obstruction. Free fluid in the abdomen which may be due to peritoneal dialysis limits detailed bowel assessment. High-density material within the colon consistent with ingested material, possibly bismuth containing products. No obvious colonic inflammation. Normal appendix is visualized Vascular/Lymphatic: Normal caliber abdominal aorta. No bulky abdominopelvic  adenopathy. Reproductive: Anteverted uterus.  No obvious adnexal mass. Other: Peritoneal dialysis catheter is coiled in the pelvis. Similar degree of free fluid throughout the abdomen and pelvis to prior exam, possibly related to peritoneal dialysis. No evidence of loculated fluid collection or free air. There is moderate generalized subcutaneous edema Musculoskeletal: There are no acute or suspicious osseous abnormalities. IMPRESSION: 1. No acute abnormality in the abdomen/pelvis. 2. Peritoneal dialysis catheter coiled in the pelvis. Similar degree of free fluid throughout the abdomen and pelvis, possibly related to peritoneal dialysis. 3. Mild hepatic steatosis. 4. Cardiomegaly with small pericardial effusion is well as generalized body wall edema, similar to prior exam. Electronically Signed   By: Keith Rake M.D.   On: 03/15/2022 17:42

## 2022-03-16 NOTE — Inpatient Diabetes Management (Signed)
Inpatient Diabetes Program Recommendations  AACE/ADA: New Consensus Statement on Inpatient Glycemic Control  Target Ranges:  Prepandial:   less than 140 mg/dL      Peak postprandial:   less than 180 mg/dL (1-2 hours)      Critically ill patients:  140 - 180 mg/dL    Latest Reference Range & Units 03/16/22 00:12 03/16/22 08:30  Glucose-Capillary 70 - 99 mg/dL 125 (H) 348 (H)    Latest Reference Range & Units 03/15/22 15:03 03/15/22 18:29 03/15/22 21:32  Glucose-Capillary 70 - 99 mg/dL 253 (H) 191 (H) 128 (H)   Review of Glycemic Control  Diabetes history: DM1 (does NOT make any insulin; requires basal, correction, and carb coverage insulin) Outpatient Diabetes medications: OmniPod insulin pump with Humalog Current orders for Inpatient glycemic control: Novolog 0-6 units TID with meals, Novolog 0-5 units QHS, Insulin pump AC&HS and 2 am  Inpatient Diabetes Program Recommendations:    Insulin: Please discontinue insulin pump order set as patient does have on insulin pump at this time. Please order Semglee 10 units Q24H (to start now) and ordering Novolog 3 units TID with meals for meal coverage if patient eats at least 50% of meals.  NOTE: Noted consult for diabetes coordinator. Patient is well known to inpatient diabetes team due to multiple admissions. Diabetes coordinator last spoke with patient on 01/31/22 during prior admission. Called patient over the phone to inquire about insulin pump and patient reports that she does not have her insulin pump on at this time. She reports that it expired yesterday so she removed it but not sure exactly what time but notes it was after she arrived at hospital.  Patient confirms that she sees Dr. Chalmers Cater and that her total basal is 0.5 units/hour (total of 12 units/day), insulin to carb ratio is 1:8 grams carbs, and insulin sensitivity is 1:50 mg/dl (1 unit drops glucose 50 mg/dl). Patient reports that her appetite has improved some as she was able to eat some  breakfast and felt a little nauseated after but has not thrown up since eating breakfast. Discussed currently ordered Novolog correction. Informed patient that it would be requested that provider order basal insulin and meal coverage since she does not have insulin pump on. Patient verbalized understanding of information discussed and has no questions at this time.  Thanks, Barnie Alderman, RN, MSN, [place] Diabetes Coordinator Inpatient Diabetes Program [phone] (Team Pager from 8am to [place])

## 2022-03-16 NOTE — Plan of Care (Signed)

## 2022-04-08 ENCOUNTER — Other Ambulatory Visit: Payer: Self-pay

## 2022-04-08 ENCOUNTER — Encounter (HOSPITAL_BASED_OUTPATIENT_CLINIC_OR_DEPARTMENT_OTHER): Payer: Self-pay | Admitting: Emergency Medicine

## 2022-04-08 ENCOUNTER — Emergency Department (HOSPITAL_BASED_OUTPATIENT_CLINIC_OR_DEPARTMENT_OTHER)
Admission: EM | Admit: 2022-04-08 | Discharge: 2022-04-08 | Disposition: A | Discharge status: Home / Self Care | Payer: Medicaid Other | Attending: Emergency Medicine | Admitting: Emergency Medicine

## 2022-04-08 DIAGNOSIS — D72819 Decreased white blood cell count, unspecified: Secondary | ICD-10-CM | POA: Diagnosis not present

## 2022-04-08 DIAGNOSIS — Z794 Long term (current) use of insulin: Secondary | ICD-10-CM | POA: Diagnosis not present

## 2022-04-08 DIAGNOSIS — N186 End stage renal disease: Secondary | ICD-10-CM | POA: Insufficient documentation

## 2022-04-08 DIAGNOSIS — R197 Diarrhea, unspecified: Secondary | ICD-10-CM | POA: Diagnosis not present

## 2022-04-08 DIAGNOSIS — Z79899 Other long term (current) drug therapy: Secondary | ICD-10-CM | POA: Diagnosis not present

## 2022-04-08 DIAGNOSIS — R1013 Epigastric pain: Secondary | ICD-10-CM | POA: Insufficient documentation

## 2022-04-08 DIAGNOSIS — Z992 Dependence on renal dialysis: Secondary | ICD-10-CM | POA: Diagnosis not present

## 2022-04-08 DIAGNOSIS — R112 Nausea with vomiting, unspecified: Secondary | ICD-10-CM | POA: Insufficient documentation

## 2022-04-08 DIAGNOSIS — E1022 Type 1 diabetes mellitus with diabetic chronic kidney disease: Secondary | ICD-10-CM | POA: Diagnosis not present

## 2022-04-08 DIAGNOSIS — R799 Abnormal finding of blood chemistry, unspecified: Secondary | ICD-10-CM | POA: Diagnosis not present

## 2022-04-08 LAB — COMPREHENSIVE METABOLIC PANEL
ALT: 12 U/L (ref 0–44)
AST: 13 U/L — ABNORMAL LOW (ref 15–41)
Albumin: 3 g/dL — ABNORMAL LOW (ref 3.5–5.0)
Alkaline Phosphatase: 68 U/L (ref 38–126)
Anion gap: 11 (ref 5–15)
BUN: 64 mg/dL — ABNORMAL HIGH (ref 6–20)
CO2: 24 mmol/L (ref 22–32)
Calcium: 8.3 mg/dL — ABNORMAL LOW (ref 8.9–10.3)
Chloride: 103 mmol/L (ref 98–111)
Creatinine, Ser: 11.64 mg/dL — ABNORMAL HIGH (ref 0.44–1.00)
GFR, Estimated: 4 mL/min — ABNORMAL LOW (ref >60)
Glucose, Bld: 138 mg/dL — ABNORMAL HIGH (ref 70–99)
Potassium: 4.6 mmol/L (ref 3.5–5.1)
Sodium: 138 mmol/L (ref 135–145)
Total Bilirubin: 0.7 mg/dL (ref 0.3–1.2)
Total Protein: 6.3 g/dL — ABNORMAL LOW (ref 6.5–8.1)

## 2022-04-08 LAB — CBC
HCT: 36.2 % (ref 36.0–46.0)
Hemoglobin: 11.9 g/dL — ABNORMAL LOW (ref 12.0–15.0)
MCH: 30.8 pg (ref 26.0–34.0)
MCHC: 32.9 g/dL (ref 30.0–36.0)
MCV: 93.8 fL (ref 80.0–100.0)
Platelets: 256 10*3/uL (ref 150–400)
RBC: 3.86 MIL/uL — ABNORMAL LOW (ref 3.87–5.11)
RDW: 13.4 % (ref 11.5–15.5)
WBC: 3.2 10*3/uL — ABNORMAL LOW (ref 4.0–10.5)
nRBC: 0 % (ref 0.0–0.2)

## 2022-04-08 LAB — LIPASE, BLOOD: Lipase: 20 U/L (ref 11–51)

## 2022-04-08 LAB — HCG, SERUM, QUALITATIVE: Preg, Serum: NEGATIVE

## 2022-04-08 MED ORDER — METOCLOPRAMIDE HCL 5 MG/ML IJ SOLN
10.0000 mg | Freq: Once | INTRAMUSCULAR | Status: AC
  Administered 2022-04-08: 10 mg via INTRAVENOUS
  Filled 2022-04-08: qty 2

## 2022-04-08 MED ORDER — PANTOPRAZOLE SODIUM 40 MG IV SOLR
40.0000 mg | Freq: Once | INTRAVENOUS | Status: AC
  Administered 2022-04-08: 40 mg via INTRAVENOUS
  Filled 2022-04-08: qty 10

## 2022-04-08 MED ORDER — DIPHENHYDRAMINE HCL 50 MG/ML IJ SOLN
12.5000 mg | Freq: Once | INTRAMUSCULAR | Status: AC
  Administered 2022-04-08: 12.5 mg via INTRAVENOUS
  Filled 2022-04-08: qty 1

## 2022-04-08 NOTE — ED Triage Notes (Signed)
Pt reports NVD x 3d; denies pain at this time

## 2022-04-08 NOTE — Discharge Instructions (Signed)
Make sure to follow-up with your gastroenterologist on Tuesday  Return for new or worsening symptoms

## 2022-04-08 NOTE — ED Provider Notes (Signed)
[facility] Provider Note   CSN: [id number] Arrival date & time: 04/08/22  1544     History  Chief Complaint  Patient presents with   Emesis   Diarrhea    Kathy Lewis is a 26 y.o. female hx significant for peripartum cardiomyopathy with EF of 20, type 1 diabetes, CKD on peritoneal dialysis, cyclical vomiting here for evaluation of nausea vomiting and loose stool.  Patient states the symptoms have been ongoing over the last 6 months.  Has difficulty tolerating p.o. intake.  Has tried multiple medications at home without relief.  Patient states this is similar to her chronic nausea and vomiting.  Typically starts with some epigastric pain when she eats or drinks and then has NBNB emesis.  She also has chronic loose stool.  No recent antibiotics or travel.  No melena or blood per rectum.  Has tried taking Imodium however has not been able to keep it down.  She has appointment to see GI doctor on Tuesday.  She has gastric emptying study recently which was negative.  No fever, chest pain, shortness of breath, back pain, dysuria, hematuria. No abd pain currently.     HPI     Home Medications Prior to Admission medications   Medication Sig Start Date End Date Taking? Authorizing Provider  acetaminophen (TYLENOL) 500 MG tablet Take 1,000 mg by mouth every 6 (six) hours as needed for headache (pain).    [provider]  butalbital-acetaminophen-caffeine (FIORICET) 50-325-40 MG tablet Take 1 tablet by mouth daily as needed for headache.    [provider]  calcitRIOL (ROCALTROL) 0.25 MCG capsule Take 0.25 mcg by mouth every morning. 08/03/21   [provider]  Calcium Carbonate Antacid (TUMS PO) Take 2 tablets by mouth 3 (three) times daily with meals.    [provider]  carvedilol (COREG) 12.5 MG tablet Take 12.5 mg by mouth 2 (two) times daily with a meal.    [provider]  clonazePAM (KLONOPIN) 0.5 MG tablet  Take 1 tablet (0.5 mg total) by mouth 2 (two) times daily as needed for anxiety. 03/16/22   [name], MD  ferrous sulfate 325 (65 FE) MG tablet Take 325 mg by mouth 3 (three) times daily with meals.    [provider]  hydrALAZINE (APRESOLINE) 100 MG tablet Take 100 mg by mouth 3 (three) times daily. 08/05/21   [provider]  insulin lispro (HUMALOG) 100 UNIT/ML injection Inject 7 Units into the skin once. 01/08/22   [provider]  magnesium oxide (MAG-OX) 400 MG tablet Take 400 mg by mouth daily. 08/03/21   [provider]  metoCLOPramide (REGLAN) 10 MG tablet Take 1 tablet (10 mg total) by mouth every 8 (eight) hours as needed for nausea. 01/17/22   Lucrezia Starch, MD  ondansetron (ZOFRAN) 4 MG tablet Take 4 mg by mouth every 8 (eight) hours as needed for nausea or vomiting.    [provider]  pantoprazole (PROTONIX) 40 MG tablet Take 1 tablet (40 mg total) by mouth daily. 03/16/22   [name], MD  prochlorperazine (COMPAZINE) 5 MG tablet Take 1 tablet (5 mg total) by mouth every 6 (six) hours as needed for nausea or vomiting. 01/21/22   Regan Lemming, MD  sacubitril-valsartan (ENTRESTO) 49-51 MG Take 1 tablet by mouth every morning.    [provider]  Vitamin D, Ergocalciferol, (DRISDOL) 1.25 MG (50000 UNIT) CAPS capsule Take 50,000 Units by mouth every Sunday.  [provider]  escitalopram (LEXAPRO) 10 MG tablet Take 20 mg by mouth daily.  05/10/20 10/06/20  [provider]  furosemide (LASIX) 40 MG tablet Take 1 tablet (40 mg total) by mouth daily. Patient not taking: No sig reported 10/28/19 02/17/21  [name], MD  lisinopril (ZESTRIL) 10 MG tablet Take 10 mg by mouth daily.  02/23/20 10/06/20  [provider]  spironolactone (ALDACTONE) 25 MG tablet Take 25 mg by mouth daily.  04/20/20 10/06/20  [provider]      Allergies    Cantaloupe extract allergy skin test, Strawberry  extract, Citrullus vulgaris, and Nsaids    Review of Systems   Review of Systems  Constitutional: Negative.   HENT: Negative.    Respiratory: Negative.    Cardiovascular: Negative.   Gastrointestinal:  Positive for diarrhea, nausea and vomiting. Negative for abdominal distention, abdominal pain, anal bleeding, blood in stool, constipation and rectal pain.  Genitourinary: Negative.   Musculoskeletal: Negative.   Skin: Negative.   Neurological: Negative.   All other systems reviewed and are negative.   Physical Exam Updated Vital Signs BP (!) 132/105 (BP Location: Left Arm)   Pulse 99   Temp 98.1 F (36.7 C) (Oral)   Resp 17   Ht 5' (1.524 m)   Wt 59 kg   SpO2 100%   BMI 25.39 kg/m  Physical Exam Vitals and nursing note reviewed.  Constitutional:      General: She is not in acute distress.    Appearance: She is well-developed. She is not ill-appearing, toxic-appearing or diaphoretic.  HENT:     Head: Normocephalic and atraumatic.     Nose: Nose normal.     Mouth/Throat:     Mouth: Mucous membranes are moist.  Eyes:     Pupils: Pupils are equal, round, and reactive to light.  Cardiovascular:     Rate and Rhythm: Normal rate.     Pulses: Normal pulses.     Heart sounds: Normal heart sounds.  Pulmonary:     Effort: Pulmonary effort is normal. No respiratory distress.     Breath sounds: Normal breath sounds.  Abdominal:     General: Bowel sounds are normal. There is no distension.     Palpations: Abdomen is soft.     Comments: Soft, nontender.  PD catheter to right lower abdomen without erythema or warmth.  Musculoskeletal:        General: No swelling, tenderness, deformity or signs of injury. Normal range of motion.     Cervical back: Normal range of motion.     Right lower leg: No edema.     Left lower leg: No edema.     Comments: No bony tenderness, compartments soft, full range of motion.  Skin:    General: Skin is warm and dry.     Capillary Refill: Capillary  refill takes less than 2 seconds.     Comments: Insulin pump  Neurological:     General: No focal deficit present.     Mental Status: She is alert and oriented to person, place, and time.  Psychiatric:        Mood and Affect: Mood normal.    ED Results / Procedures / Treatments   Labs (all labs ordered are listed, but only abnormal results are displayed) Labs Reviewed  COMPREHENSIVE METABOLIC PANEL - Abnormal; Notable for the following components:      Result Value   Glucose, Bld 138 (*)    BUN 64 (*)  Creatinine, Ser 11.64 (*)    Calcium 8.3 (*)    Total Protein 6.3 (*)    Albumin 3.0 (*)    AST 13 (*)    GFR, Estimated 4 (*)    All other components within normal limits  CBC - Abnormal; Notable for the following components:   WBC 3.2 (*)    RBC 3.86 (*)    Hemoglobin 11.9 (*)    All other components within normal limits  LIPASE, BLOOD  HCG, SERUM, QUALITATIVE  URINALYSIS, ROUTINE W REFLEX MICROSCOPIC    EKG EKG Interpretation  Date/Time:  Saturday April 08 2022 19:13:39 EDT Ventricular Rate:  84 PR Interval:  148 QRS Duration: 86 QT Interval:  466 QTC Calculation: 551 R Axis:   27 Text Interpretation: Sinus rhythm Left atrial enlargement Nonspecific T abnormalities, diffuse leads Prolonged QT interval similar to prior EKG Confirmed by Malvin Johns [phone]) on 04/08/2022 7:17:17 PM  Radiology No results found.  Procedures Procedures    Medications Ordered in ED Medications  pantoprazole (PROTONIX) injection 40 mg (40 mg Intravenous Given 04/08/22 1954)  metoCLOPramide (REGLAN) injection 10 mg (10 mg Intravenous Given 04/08/22 1956)  diphenhydrAMINE (BENADRYL) injection 12.5 mg (12.5 mg Intravenous Given 04/08/22 1958)    ED Course/ Medical Decision Making/ A&P    26 year old multiple medical problems including type 1 diabetes on insulin pump, CKD peritoneal dialysis, heart failure EF less than 20, cyclical vomiting here for evaluation of nausea and  vomiting.  States been going on over the last 3 days.  Unfortunately states she has been dealing with this as a chronic problem for the last 6 months and has appointment to see GI on Tuesday.  She had a recent gastric emptying study which was negative.  Denies marijuana use.  Has chronic loose stools without melena or bright blood per rectum.  No recent antibiotics or travel have low suspicion for infectious process.  Labs and imaging personally viewed and interpreted:  CBC leukopenia 3.2 Pregnancy test negative Lipase 29 metabolic panel glucose 191, BUN 64, similar to prior, creatinine 11.64, similar to prior Ekg no ischemic changes however does have prolonged QT interval, chronic per prior medical records  Recently admitted about 3 weeks ago, had CT scan performed at that time which not show any acute abnormality.  Patient reassessed.  Feeling significantly improved.  Able to drink a soda as well as cheese puffs without difficulty.  States she feels like she is at her baseline ready to DC home.  Encourage close follow-up with GI.  She has multiple antiemetics at home which previously stated she did not need refills for.  Patient does not meet the SIRS or Sepsis criteria.  On repeat exam patient does not have a surgical abdomin and there are no peritoneal signs.  No indication of appendicitis, bowel obstruction, bowel perforation, cholecystitis, diverticulitis, PID or ectopic pregnancy.   The patient has been appropriately medically screened and/or stabilized in the ED. I have low suspicion for any other emergent medical condition which would require further screening, evaluation or treatment in the ED or require inpatient management.  Patient is hemodynamically stable and in no acute distress.  Patient able to ambulate in department prior to ED.  Evaluation does not show acute pathology that would require ongoing or additional emergent interventions while in the emergency department or further  inpatient treatment.  I have discussed the diagnosis with the patient and answered all questions.  Pain is been managed while in the emergency department and  patient has no further complaints prior to discharge.  Patient is comfortable with plan discussed in room and is stable for discharge at this time.  I have discussed strict return precautions for returning to the emergency department.  Patient was encouraged to follow-up with PCP/specialist refer to at discharge.                           Medical Decision Making Amount and/or Complexity of Data Reviewed Independent Historian: parent External Data Reviewed: labs, radiology, ECG and notes. Labs: ordered. Decision-making details documented in ED Course. ECG/medicine tests: ordered and independent interpretation performed. Decision-making details documented in ED Course.  Risk OTC drugs. Prescription drug management. Parenteral controlled substances. Decision regarding hospitalization. Diagnosis or treatment significantly limited by social determinants of health.          Final Clinical Impression(s) / ED Diagnoses Final diagnoses:  Nausea and vomiting, unspecified vomiting type  Peritoneal dialysis status ([place])  Elevated BUN    Rx / DC Orders ED Discharge Orders     None         Harlei Lehrmann A, PA-C 04/08/22 2104    Malvin Johns, MD 04/08/22 2354

## 2022-04-11 ENCOUNTER — Encounter: Payer: Self-pay | Admitting: Physician Assistant

## 2022-04-11 ENCOUNTER — Ambulatory Visit (INDEPENDENT_AMBULATORY_CARE_PROVIDER_SITE_OTHER): Payer: Medicaid Other | Admitting: Physician Assistant

## 2022-04-11 VITALS — BP 145/90 | HR 94 | Ht 60.0 in | Wt 132.0 lb

## 2022-04-11 DIAGNOSIS — I5022 Chronic systolic (congestive) heart failure: Secondary | ICD-10-CM

## 2022-04-11 DIAGNOSIS — R112 Nausea with vomiting, unspecified: Secondary | ICD-10-CM

## 2022-04-11 DIAGNOSIS — N186 End stage renal disease: Secondary | ICD-10-CM

## 2022-04-11 DIAGNOSIS — R634 Abnormal weight loss: Secondary | ICD-10-CM | POA: Diagnosis not present

## 2022-04-11 DIAGNOSIS — R1013 Epigastric pain: Secondary | ICD-10-CM

## 2022-04-11 DIAGNOSIS — G8929 Other chronic pain: Secondary | ICD-10-CM

## 2022-04-11 DIAGNOSIS — R197 Diarrhea, unspecified: Secondary | ICD-10-CM | POA: Diagnosis not present

## 2022-04-11 MED ORDER — ONDANSETRON HCL 4 MG PO TABS: 4.0000 mg | ORAL_TABLET | Freq: Four times a day (QID) | ORAL | 3 refills | Status: DC

## 2022-04-11 MED ORDER — PANTOPRAZOLE SODIUM 40 MG PO TBEC: 40.0000 mg | DELAYED_RELEASE_TABLET | Freq: Two times a day (BID) | ORAL | 3 refills | Status: DC

## 2022-04-11 MED ORDER — PROMETHAZINE HCL 25 MG RE SUPP: 25.0000 mg | RECTAL | 0 refills | Status: DC | PRN

## 2022-04-11 NOTE — Patient Instructions (Addendum)
We have sent the following medications to your pharmacy for you to pick up at your convenience: Increase Pantoprazole 40 mg twice daily 30-60 minutes before breakfast and dinner. Zofran 4 mg every 6 hours (scheduled).  Phenergan 25 mg suppositories as needed for breakthrough nausea/vomiting.   _______________________________________________________  If you are age 26 or older, your body mass index should be between 23-30. Your Body mass index is 25.78 kg/m. If this is out of the aforementioned range listed, please consider follow up with your Primary Care Provider.  If you are age 49 or younger, your body mass index should be between 19-25. Your Body mass index is 25.78 kg/m. If this is out of the aformentioned range listed, please consider follow up with your Primary Care Provider.   ________________________________________________________  The  GI providers would like to encourage you to use [facility] to communicate with providers for non-urgent requests or questions.  Due to long hold times on the telephone, sending your provider a message by [facility] may be a faster and more efficient way to get a response.  Please allow 48 business hours for a response.  Please remember that this is for non-urgent requests.  _______________________________________________________

## 2022-04-11 NOTE — H&P (View-Only) (Signed)
Chief Complaint: Epigastric pain, Nausea and Vomiting  HPI:    Kathy Lewis is a  26 y/o female with a past medical history as listed below including peripartum cardiomyopathy with an EF of 20, type 1 diabetes, CKD on peritoneal dialysis and cyclical vomiting, who was referred to me by Wynn Banker* for a complaint of epigastric pain, nausea and vomiting.      03/15/2022-03/16/2022 patient admitted with nausea and vomiting.  Apparently had admitted the last month for DKA and abdominal pain    04/08/2022 patient seen in the ER again for vomiting and diarrhea.  At that time discussed that C8 had ongoing symptoms for 6 months and had tried multiple medications at home without relief.  Describes these as typical episodes started with epigastric pain when she ate or drink and then she would have vomiting and had chronic loose stool.      Today, the patient presents to clinic accompanied by her father, who does assist with her history.  They explained that since April or May of this year she started with episodes of abdominal pain, nausea vomiting and diarrhea.  Patient tells me she can almost remember the day all of this started she had eggs in the morning and then started with abdominal cramping which resulted in diarrhea that worsened over the next few days and then developed severe nausea and vomiting unable to keep anything down and epigastric pain.  This lasted for a good 5 days where she was unable to eat or drink anything.  She apparently went to the ER and was given Reglan and opioids and was okay for a few days but then symptoms all came back requiring another ER visit.  Patient tells me this went on for a couple of times and then she had a break of symptoms for a few weeks, but then everything came back.  Tells me now she can go a couple of days or sometimes a few weeks in between episodes but they always start the same time in that she will eat something and then develop cramping in her lower  abdomen and watery diarrhea which will then result in nausea and a lot of vomiting and pain.  This typically brings her back into the ER.  Recently she was hospitalized as above and given Pantoprazole 40 mg once a day which seemed to help things for a while but now her symptoms all started back over the past week and she went to the ER over the weekend.  Since then she has been taking her Pantoprazole twice a day.  She typically uses her Metoclopramide 10 mg before eating most meals of the day if she feels nauseous.  In between these episodes the patient will have solid stool and feel fine.  Cannot seem to identify a food trigger.  Associated symptoms include weight loss.  (Had 2 or 3 episodes of vomiting in the room with the CMA prior to my entrance)    Patient started peritoneal dialysis in September 2022.  She is adopted so does not know any of her family history.    Denies fever, chills, blood in her stool or symptoms that awaken her from sleep.  Past Medical History:  Diagnosis Date   Anxiety    Bipolar 2 disorder ([place])    Chronic systolic (congestive) heart failure (HCC)    Depression    DKA (diabetic ketoacidoses)    ESRD on peritoneal dialysis ([place])    HSV infection  on valtrex   Hypokalemia    Leukocytosis    Migraine    Noncompliance with medication regimen    Preeclampsia    Severe anemia    Type 1 diabetes mellitus ([place])     Past Surgical History:  Procedure Laterality Date   CARDIAC CATHETERIZATION     DILATION AND EVACUATION N/A 10/22/2019   Procedure: ULTRASOUND GUIDED DILATATION AND EVACUATION;  Surgeon: [name], MD;  Location: [facility];  Service: Gynecology;  Laterality: N/A;   NO PAST SURGERIES     peritoneal dialysis catheter insertion     RENAL BIOPSY      Current Outpatient Medications  Medication Sig Dispense Refill   acetaminophen (TYLENOL) 500 MG tablet Take 1,000 mg by mouth every 6 (six) hours as needed for headache (pain).      butalbital-acetaminophen-caffeine (FIORICET) 50-325-40 MG tablet Take 1 tablet by mouth daily as needed for headache.     calcitRIOL (ROCALTROL) 0.25 MCG capsule Take 0.25 mcg by mouth every morning.     Calcium Carbonate Antacid (TUMS PO) Take 2 tablets by mouth 3 (three) times daily with meals.     carvedilol (COREG) 12.5 MG tablet Take 12.5 mg by mouth 2 (two) times daily with a meal.     clonazePAM (KLONOPIN) 0.5 MG tablet Take 1 tablet (0.5 mg total) by mouth 2 (two) times daily as needed for anxiety. 20 tablet 0   ferrous sulfate 325 (65 FE) MG tablet Take 325 mg by mouth 3 (three) times daily with meals.     hydrALAZINE (APRESOLINE) 100 MG tablet Take 100 mg by mouth 3 (three) times daily.     insulin lispro (HUMALOG) 100 UNIT/ML injection Inject 7 Units into the skin once.     magnesium oxide (MAG-OX) 400 MG tablet Take 400 mg by mouth daily.     metoCLOPramide (REGLAN) 10 MG tablet Take 1 tablet (10 mg total) by mouth every 8 (eight) hours as needed for nausea. 20 tablet 0   ondansetron (ZOFRAN) 4 MG tablet Take 4 mg by mouth every 8 (eight) hours as needed for nausea or vomiting.     pantoprazole (PROTONIX) 40 MG tablet Take 1 tablet (40 mg total) by mouth daily. 30 tablet 0   prochlorperazine (COMPAZINE) 5 MG tablet Take 1 tablet (5 mg total) by mouth every 6 (six) hours as needed for nausea or vomiting. 30 tablet 0   sacubitril-valsartan (ENTRESTO) 49-51 MG Take 1 tablet by mouth every morning.     Vitamin D, Ergocalciferol, (DRISDOL) 1.25 MG (50000 UNIT) CAPS capsule Take 50,000 Units by mouth every Sunday.     No current facility-administered medications for this visit.    Allergies as of 04/11/2022 - Review Complete 04/08/2022  Allergen Reaction Noted   Cantaloupe extract allergy skin test Itching 06/22/2017   Strawberry extract Itching 06/22/2017   Citrullus vulgaris Itching 01/30/2017   Nsaids Other (See Comments) 10/22/2019    Family History  Adopted: Yes  Problem  Relation Age of Onset   Heart disease Neg Hx     Social History   Socioeconomic History   Marital status: Single    Spouse name: Not on file   Number of children: Not on file   Years of education: Not on file   Highest education level: Not on file  Occupational History   Occupation: unemployed  Tobacco Use   Smoking status: Former    Packs/day: 1.00    Years: 2.00    Total pack years:  2.00    Types: Cigars, Cigarettes   Smokeless tobacco: Never  Vaping Use   Vaping Use: Never used  Substance and Sexual Activity   Alcohol use: No   Drug use: No   Sexual activity: Yes    Birth control/protection: None  Other Topics Concern   Not on file  Social History Narrative   Not on file   Social Determinants of Health   Financial Resource Strain: Not on file  Food Insecurity: Not on file  Transportation Needs: Not on file  Physical Activity: Not on file  Stress: Not on file  Social Connections: Not on file  Intimate Partner Violence: Not on file    Review of Systems:    Constitutional: No weight loss, fever or chills Skin: No rash  Cardiovascular: No chest pain   Respiratory: No SOB  Gastrointestinal: See HPI and otherwise negative Genitourinary: No dysuria Neurological: No headache, dizziness or syncope Musculoskeletal: No new muscle or joint pain Hematologic: No bleeding Psychiatric: No history of depression or anxiety   Physical Exam:  Vital signs: BP (!) 145/90   Pulse 94   Ht 5' (1.524 m)   Wt 132 lb (59.9 kg)   BMI 25.78 kg/m    Constitutional:   Pleasant AA female appears to be in NAD, Well developed, Well nourished, alert and cooperative Head:  Normocephalic and atraumatic. Eyes:   PEERL, EOMI. No icterus. Conjunctiva pink. Ears:  Normal auditory acuity. Neck:  Supple Throat: Oral cavity and pharynx without inflammation, swelling or lesion.  Respiratory: Respirations even and unlabored. Lungs clear to auscultation bilaterally.   No wheezes, crackles,  or rhonchi.  Cardiovascular: Normal S1, S2. No MRG. Regular rate and rhythm. No peripheral edema, cyanosis or pallor.  Gastrointestinal:  Soft, nondistended, moderate epigastric ttp. No rebound or guarding. Normal bowel sounds. No appreciable masses or hepatomegaly. +peritoneal catheter Rectal:  Not performed.  Msk:  Symmetrical without gross deformities. Without edema, no deformity or joint abnormality.  Neurologic:  Alert and  oriented x4;  grossly normal neurologically.  Skin:   Dry and intact without significant lesions or rashes. Psychiatric: Demonstrates good judgement and reason without abnormal affect or behaviors.  RELEVANT LABS AND IMAGING: CBC    Component Value Date/Time   WBC 3.2 (L) 04/08/2022 1840   RBC 3.86 (L) 04/08/2022 1840   HGB 11.9 (L) 04/08/2022 1840   HCT 36.2 04/08/2022 1840   HCT 23.0 (L) 10/18/2019 0534   PLT 256 04/08/2022 1840   MCV 93.8 04/08/2022 1840   MCH 30.8 04/08/2022 1840   MCHC 32.9 04/08/2022 1840   RDW 13.4 04/08/2022 1840   LYMPHSABS 0.9 03/15/2022 1512   MONOABS 0.2 03/15/2022 1512   EOSABS 0.1 03/15/2022 1512   BASOSABS 0.0 03/15/2022 1512    CMP     Component Value Date/Time   NA 138 04/08/2022 1840   K 4.6 04/08/2022 1840   CL 103 04/08/2022 1840   CO2 24 04/08/2022 1840   GLUCOSE 138 (H) 04/08/2022 1840   BUN 64 (H) 04/08/2022 1840   CREATININE 11.64 (H) 04/08/2022 1840   CALCIUM 8.3 (L) 04/08/2022 1840   PROT 6.3 (L) 04/08/2022 1840   ALBUMIN 3.0 (L) 04/08/2022 1840   AST 13 (L) 04/08/2022 1840   ALT 12 04/08/2022 1840   ALKPHOS 68 04/08/2022 1840   BILITOT 0.7 04/08/2022 1840   GFRNONAA 4 (L) 04/08/2022 1840   GFRAA 52 (L) 10/28/2019 1933    Assessment: 1.  Nausea and vomiting: Seems cyclical  in nature with no identifiable trigger, recent hospitalization with normal gastric emptying study (not sure what medication she was on at that time), some benefit from Pantoprazole in the past but now with continued episodes, does  have history of heart failure and peritoneal dialysis which could be complicating the picture; consider gastritis +/- PUD +/- H. pylori +/- cyclical vomiting syndrome versus other 2.  Epigastric pain: Typically starts after the above and is likely related 3.  Diarrhea: With all of the above, but does have solid stools in between episodes 4.  Weight loss: Patient reports about 15 pounds over the past 4 to 5 months since the symptoms started, likely related to decreased intake 5.  Decreased ejection fraction of 20% 6.  Peritoneal dialysis  Plan: 1.  Patient needs diagnostic EGD and colonoscopy given cyclical symptoms.  These will need to take place in the hospital given her decreased ejection fraction of only 20%.  Discussed with patient that we will try to arrange for this within the next few weeks so that she does not to go back to the ER in between.  We will also try to adjust her medications.  These procedures will be scheduled with her next available physician, we will try to schedule him with Dr. Candis Schatz who she has been assigned to today. 2.  Did go ahead and discuss EGD and colonoscopy patient was provided with a detailed list of risks of procedures and she agrees to proceed. 3.  For now patient will increase her Pantoprazole to 40 mg twice a day, 30-60 minutes before breakfast and dinner.  Prescribed #60 with 5 refills. 4.  Discussed possible side effects from regular Reglan use.  Instead of increasing this dosage we will try to use Zofran 4 mg 1 tab scheduled every 6 hours to prevent episodes of nausea and vomiting.  Told the patient to stay on this medicine even if she is feeling well for now. 5.  Did discuss that if she has breakthrough nausea/vomiting she could try dose of Reglan.  Also prescribed Promethazine suppositories 25 mg every 6 hours as needed for breakthrough nausea and vomiting, #10 with 1 refill. 6.  Patient will hear from our clinic in regards to scheduling these procedures.   She will follow-up per recommendations after time procedures. 7.  Did discuss with the patient that if she ends up back in the ER with symptoms prior to scheduled EGD and colonoscopy I would recommend that she has to be admitted so that we can perform these procedures while she is hospitalized more quickly for her.  We will need to decide at that point if she can tolerate a bowel prep, may need to start with just EGD.  Please consult the GI service if she ends up back in the ER.  Ellouise Newer, PA-C [facility] 04/11/2022, 8:30 AM  Cc: Wynn Banker*

## 2022-04-11 NOTE — Progress Notes (Signed)
Chief Complaint: Epigastric pain, Nausea and Vomiting  HPI:    Kathy Lewis is a  26 y/o female with a past medical history as listed below including peripartum cardiomyopathy with an EF of 20, type 1 diabetes, CKD on peritoneal dialysis and cyclical vomiting, who was referred to me by Kathy Lewis* for a complaint of epigastric pain, nausea and vomiting.      03/15/2022-03/16/2022 patient admitted with nausea and vomiting.  Apparently had admitted the last month for DKA and abdominal pain    04/08/2022 patient seen in the ER again for vomiting and diarrhea.  At that time discussed that C8 had ongoing symptoms for 6 months and had tried multiple medications at home without relief.  Describes these as typical episodes started with epigastric pain when she ate or drink and then she would have vomiting and had chronic loose stool.      Today, the patient presents to clinic accompanied by her father, who does assist with her history.  They explained that since April or May of this year she started with episodes of abdominal pain, nausea vomiting and diarrhea.  Patient tells me she can almost remember the day all of this started she had eggs in the morning and then started with abdominal cramping which resulted in diarrhea that worsened over the next few days and then developed severe nausea and vomiting unable to keep anything down and epigastric pain.  This lasted for a good 5 days where she was unable to eat or drink anything.  She apparently went to the ER and was given Reglan and opioids and was okay for a few days but then symptoms all came back requiring another ER visit.  Patient tells me this went on for a couple of times and then she had a break of symptoms for a few weeks, but then everything came back.  Tells me now she can go a couple of days or sometimes a few weeks in between episodes but they always start the same time in that she will eat something and then develop cramping in her lower  abdomen and watery diarrhea which will then result in nausea and a lot of vomiting and pain.  This typically brings her back into the ER.  Recently she was hospitalized as above and given Pantoprazole 40 mg once a day which seemed to help things for a while but now her symptoms all started back over the past week and she went to the ER over the weekend.  Since then she has been taking her Pantoprazole twice a day.  She typically uses her Metoclopramide 10 mg before eating most meals of the day if she feels nauseous.  In between these episodes the patient will have solid stool and feel fine.  Cannot seem to identify a food trigger.  Associated symptoms include weight loss.  (Had 2 or 3 episodes of vomiting in the room with the CMA prior to my entrance)    Patient started peritoneal dialysis in September 2022.  She is adopted so does not know any of her family history.    Denies fever, chills, blood in her stool or symptoms that awaken her from sleep.  Past Medical History:  Diagnosis Date   Anxiety    Bipolar 2 disorder ([place])    Chronic systolic (congestive) heart failure (HCC)    Depression    DKA (diabetic ketoacidoses)    ESRD on peritoneal dialysis ([place])    HSV infection  on valtrex   Hypokalemia    Leukocytosis    Migraine    Noncompliance with medication regimen    Preeclampsia    Severe anemia    Type 1 diabetes mellitus ([place])     Past Surgical History:  Procedure Laterality Date   CARDIAC CATHETERIZATION     DILATION AND EVACUATION N/A 10/22/2019   Procedure: ULTRASOUND GUIDED DILATATION AND EVACUATION;  Surgeon: [name], MD;  Location: [facility];  Service: Gynecology;  Laterality: N/A;   NO PAST SURGERIES     peritoneal dialysis catheter insertion     RENAL BIOPSY      Current Outpatient Medications  Medication Sig Dispense Refill   acetaminophen (TYLENOL) 500 MG tablet Take 1,000 mg by mouth every 6 (six) hours as needed for headache (pain).      butalbital-acetaminophen-caffeine (FIORICET) 50-325-40 MG tablet Take 1 tablet by mouth daily as needed for headache.     calcitRIOL (ROCALTROL) 0.25 MCG capsule Take 0.25 mcg by mouth every morning.     Calcium Carbonate Antacid (TUMS PO) Take 2 tablets by mouth 3 (three) times daily with meals.     carvedilol (COREG) 12.5 MG tablet Take 12.5 mg by mouth 2 (two) times daily with a meal.     clonazePAM (KLONOPIN) 0.5 MG tablet Take 1 tablet (0.5 mg total) by mouth 2 (two) times daily as needed for anxiety. 20 tablet 0   ferrous sulfate 325 (65 FE) MG tablet Take 325 mg by mouth 3 (three) times daily with meals.     hydrALAZINE (APRESOLINE) 100 MG tablet Take 100 mg by mouth 3 (three) times daily.     insulin lispro (HUMALOG) 100 UNIT/ML injection Inject 7 Units into the skin once.     magnesium oxide (MAG-OX) 400 MG tablet Take 400 mg by mouth daily.     metoCLOPramide (REGLAN) 10 MG tablet Take 1 tablet (10 mg total) by mouth every 8 (eight) hours as needed for nausea. 20 tablet 0   ondansetron (ZOFRAN) 4 MG tablet Take 4 mg by mouth every 8 (eight) hours as needed for nausea or vomiting.     pantoprazole (PROTONIX) 40 MG tablet Take 1 tablet (40 mg total) by mouth daily. 30 tablet 0   prochlorperazine (COMPAZINE) 5 MG tablet Take 1 tablet (5 mg total) by mouth every 6 (six) hours as needed for nausea or vomiting. 30 tablet 0   sacubitril-valsartan (ENTRESTO) 49-51 MG Take 1 tablet by mouth every morning.     Vitamin D, Ergocalciferol, (DRISDOL) 1.25 MG (50000 UNIT) CAPS capsule Take 50,000 Units by mouth every Sunday.     No current facility-administered medications for this visit.    Allergies as of 04/11/2022 - Review Complete 04/08/2022  Allergen Reaction Noted   Cantaloupe extract allergy skin test Itching 06/22/2017   Strawberry extract Itching 06/22/2017   Citrullus vulgaris Itching 01/30/2017   Nsaids Other (See Comments) 10/22/2019    Family History  Adopted: Yes  Problem  Relation Age of Onset   Heart disease Neg Hx     Social History   Socioeconomic History   Marital status: Single    Spouse name: Not on file   Number of children: Not on file   Years of education: Not on file   Highest education level: Not on file  Occupational History   Occupation: unemployed  Tobacco Use   Smoking status: Former    Packs/day: 1.00    Years: 2.00    Total pack years:  2.00    Types: Cigars, Cigarettes   Smokeless tobacco: Never  Vaping Use   Vaping Use: Never used  Substance and Sexual Activity   Alcohol use: No   Drug use: No   Sexual activity: Yes    Birth control/protection: None  Other Topics Concern   Not on file  Social History Narrative   Not on file   Social Determinants of Health   Financial Resource Strain: Not on file  Food Insecurity: Not on file  Transportation Needs: Not on file  Physical Activity: Not on file  Stress: Not on file  Social Connections: Not on file  Intimate Partner Violence: Not on file    Review of Systems:    Constitutional: No weight loss, fever or chills Skin: No rash  Cardiovascular: No chest pain   Respiratory: No SOB  Gastrointestinal: See HPI and otherwise negative Genitourinary: No dysuria Neurological: No headache, dizziness or syncope Musculoskeletal: No new muscle or joint pain Hematologic: No bleeding Psychiatric: No history of depression or anxiety   Physical Exam:  Vital signs: BP (!) 145/90   Pulse 94   Ht 5' (1.524 m)   Wt 132 lb (59.9 kg)   BMI 25.78 kg/m    Constitutional:   Pleasant AA female appears to be in NAD, Well developed, Well nourished, alert and cooperative Head:  Normocephalic and atraumatic. Eyes:   PEERL, EOMI. No icterus. Conjunctiva pink. Ears:  Normal auditory acuity. Neck:  Supple Throat: Oral cavity and pharynx without inflammation, swelling or lesion.  Respiratory: Respirations even and unlabored. Lungs clear to auscultation bilaterally.   No wheezes, crackles,  or rhonchi.  Cardiovascular: Normal S1, S2. No MRG. Regular rate and rhythm. No peripheral edema, cyanosis or pallor.  Gastrointestinal:  Soft, nondistended, moderate epigastric ttp. No rebound or guarding. Normal bowel sounds. No appreciable masses or hepatomegaly. +peritoneal catheter Rectal:  Not performed.  Msk:  Symmetrical without gross deformities. Without edema, no deformity or joint abnormality.  Neurologic:  Alert and  oriented x4;  grossly normal neurologically.  Skin:   Dry and intact without significant lesions or rashes. Psychiatric: Demonstrates good judgement and reason without abnormal affect or behaviors.  RELEVANT LABS AND IMAGING: CBC    Component Value Date/Time   WBC 3.2 (L) 04/08/2022 1840   RBC 3.86 (L) 04/08/2022 1840   HGB 11.9 (L) 04/08/2022 1840   HCT 36.2 04/08/2022 1840   HCT 23.0 (L) 10/18/2019 0534   PLT 256 04/08/2022 1840   MCV 93.8 04/08/2022 1840   MCH 30.8 04/08/2022 1840   MCHC 32.9 04/08/2022 1840   RDW 13.4 04/08/2022 1840   LYMPHSABS 0.9 03/15/2022 1512   MONOABS 0.2 03/15/2022 1512   EOSABS 0.1 03/15/2022 1512   BASOSABS 0.0 03/15/2022 1512    CMP     Component Value Date/Time   NA 138 04/08/2022 1840   K 4.6 04/08/2022 1840   CL 103 04/08/2022 1840   CO2 24 04/08/2022 1840   GLUCOSE 138 (H) 04/08/2022 1840   BUN 64 (H) 04/08/2022 1840   CREATININE 11.64 (H) 04/08/2022 1840   CALCIUM 8.3 (L) 04/08/2022 1840   PROT 6.3 (L) 04/08/2022 1840   ALBUMIN 3.0 (L) 04/08/2022 1840   AST 13 (L) 04/08/2022 1840   ALT 12 04/08/2022 1840   ALKPHOS 68 04/08/2022 1840   BILITOT 0.7 04/08/2022 1840   GFRNONAA 4 (L) 04/08/2022 1840   GFRAA 52 (L) 10/28/2019 1933    Assessment: 1.  Nausea and vomiting: Seems cyclical  in nature with no identifiable trigger, recent hospitalization with normal gastric emptying study (not sure what medication she was on at that time), some benefit from Pantoprazole in the past but now with continued episodes, does  have history of heart failure and peritoneal dialysis which could be complicating the picture; consider gastritis +/- PUD +/- H. pylori +/- cyclical vomiting syndrome versus other 2.  Epigastric pain: Typically starts after the above and is likely related 3.  Diarrhea: With all of the above, but does have solid stools in between episodes 4.  Weight loss: Patient reports about 15 pounds over the past 4 to 5 months since the symptoms started, likely related to decreased intake 5.  Decreased ejection fraction of 20% 6.  Peritoneal dialysis  Plan: 1.  Patient needs diagnostic EGD and colonoscopy given cyclical symptoms.  These will need to take place in the hospital given her decreased ejection fraction of only 20%.  Discussed with patient that we will try to arrange for this within the next few weeks so that she does not to go back to the ER in between.  We will also try to adjust her medications.  These procedures will be scheduled with her next available physician, we will try to schedule him with Dr. Candis Schatz who she has been assigned to today. 2.  Did go ahead and discuss EGD and colonoscopy patient was provided with a detailed list of risks of procedures and she agrees to proceed. 3.  For now patient will increase her Pantoprazole to 40 mg twice a day, 30-60 minutes before breakfast and dinner.  Prescribed #60 with 5 refills. 4.  Discussed possible side effects from regular Reglan use.  Instead of increasing this dosage we will try to use Zofran 4 mg 1 tab scheduled every 6 hours to prevent episodes of nausea and vomiting.  Told the patient to stay on this medicine even if she is feeling well for now. 5.  Did discuss that if she has breakthrough nausea/vomiting she could try dose of Reglan.  Also prescribed Promethazine suppositories 25 mg every 6 hours as needed for breakthrough nausea and vomiting, #10 with 1 refill. 6.  Patient will hear from our clinic in regards to scheduling these procedures.   She will follow-up per recommendations after time procedures. 7.  Did discuss with the patient that if she ends up back in the ER with symptoms prior to scheduled EGD and colonoscopy I would recommend that she has to be admitted so that we can perform these procedures while she is hospitalized more quickly for her.  We will need to decide at that point if she can tolerate a bowel prep, may need to start with just EGD.  Please consult the GI service if she ends up back in the ER.  Ellouise Newer, PA-C [facility] 04/11/2022, 8:30 AM  Cc: Kathy Lewis*

## 2022-04-12 NOTE — Progress Notes (Signed)
Agree with the assessment and plan as outlined by Ellouise Newer, PA-C.  Patient warrants upper and lower endoscopy given refractory symptoms.  Her procedures would need to be performed in the hospital setting due to her ESRD/cardiomyopathy.  My next available hospital date is in a month (September 19).  If any of my colleagues have any hospital slots available before then, we may be able to get her in before then.  Also agree with plans to perform this in the inpatient setting should she need to be admitted for these symptoms.. Would also ensure that there is no marijuana/THC use, as this is a very common cause of refractory nausea and vomiting. Enas Winchel E. Candis Schatz, MD

## 2022-04-14 ENCOUNTER — Other Ambulatory Visit: Payer: Self-pay | Admitting: *Deleted

## 2022-04-14 ENCOUNTER — Telehealth: Payer: Self-pay | Admitting: *Deleted

## 2022-04-14 DIAGNOSIS — I5022 Chronic systolic (congestive) heart failure: Secondary | ICD-10-CM

## 2022-04-14 DIAGNOSIS — R634 Abnormal weight loss: Secondary | ICD-10-CM

## 2022-04-14 DIAGNOSIS — R197 Diarrhea, unspecified: Secondary | ICD-10-CM

## 2022-04-14 DIAGNOSIS — G8929 Other chronic pain: Secondary | ICD-10-CM

## 2022-04-14 DIAGNOSIS — R112 Nausea with vomiting, unspecified: Secondary | ICD-10-CM

## 2022-04-14 DIAGNOSIS — N186 End stage renal disease: Secondary | ICD-10-CM

## 2022-04-14 MED ORDER — NA SULFATE-K SULFATE-MG SULF 17.5-3.13-1.6 GM/177ML PO SOLN: 1.0000 | Freq: Once | ORAL | 0 refills | Status: AC

## 2022-04-14 NOTE — Telephone Encounter (Signed)
Patient returned your call.

## 2022-04-14 NOTE — Telephone Encounter (Signed)
Left message for patient to call office.  

## 2022-04-14 NOTE — Telephone Encounter (Signed)
-----   Message from [name], [place] sent at 04/13/2022  3:43 PM EDT ----- Regarding: FW: Kathy Lewis [name] Dr. Candis Schatz, no she has not been scheduled with anyone else.  I will have Towanda Hornstein call and arrange.  Thanks, JLL ----- Message ----- From: [name], MD Sent: 04/13/2022   3:18 PM EDT To: [name], PA; [name], [place] Subject: RE: Kathy Lewis Dorothea Ogle,  My two patients that I had planned for Aug 28th fell through, so we can Ms. [name] on Aug 28th if she is able.  As far as you know she has not been scheduled with anyone else, correct?  Bath,  Can you get [name] set up for an EGD/Colonoscopy with me on Aug 28th at [facility]?  Thanks   ----- Message ----- From: [name], PA Sent: 04/11/2022   9:41 AM EDT To: [name], MD Subject: Kathy Lewis [name]                                      Patient needs EGD and colonoscopy ideally in the hospital given her decreased ejection fraction, also on peritoneal dialysis.  She keeps repeat visiting the ER and was even admitted for a day for cyclical nausea and vomiting with diarrhea.  I think she would benefit from colonoscopy as well given diarrheal symptoms.  I see that you are in the hospital in a few weeks, wondered if we could schedule her that week otherwise we need to discuss with other physicians or I am afraid she is going to end up back in the hospital.  Did tell her that if she ends up back in the ER prior to scheduled procedures that she should asked to be admitted so that we can get these things done for her.  Any thoughts?  Thanks, JL L

## 2022-04-14 NOTE — Telephone Encounter (Signed)
Patient's instructions sent via [place]. Patient will call with any questions. Suprep sent to patient's pharmacy.

## 2022-04-14 NOTE — Telephone Encounter (Signed)
Spoke with patient and she agree to Monday 04/24/22.

## 2022-04-17 ENCOUNTER — Encounter (HOSPITAL_COMMUNITY): Payer: Self-pay | Admitting: Gastroenterology

## 2022-04-17 NOTE — Progress Notes (Signed)
Attempted to obtain medical history via telephone, unable to reach at this time. HIPAA compliant voicemail message left requesting return call to pre surgical testing department. 

## 2022-04-24 ENCOUNTER — Ambulatory Visit (HOSPITAL_COMMUNITY)
Admission: RE | Admit: 2022-04-24 | Discharge: 2022-04-24 | Disposition: A | Discharge status: Home / Self Care | Payer: Medicaid Other | Attending: Gastroenterology | Admitting: Gastroenterology

## 2022-04-24 ENCOUNTER — Encounter (HOSPITAL_COMMUNITY): Payer: Self-pay | Admitting: Gastroenterology

## 2022-04-24 ENCOUNTER — Other Ambulatory Visit: Payer: Self-pay

## 2022-04-24 ENCOUNTER — Ambulatory Visit (HOSPITAL_BASED_OUTPATIENT_CLINIC_OR_DEPARTMENT_OTHER): Payer: Medicaid Other | Admitting: Certified Registered"

## 2022-04-24 ENCOUNTER — Encounter (HOSPITAL_COMMUNITY)
Admission: RE | Disposition: A | Discharge status: Home / Self Care | Payer: Self-pay | Source: Home / Self Care | Attending: Gastroenterology

## 2022-04-24 ENCOUNTER — Ambulatory Visit (HOSPITAL_COMMUNITY): Payer: Medicaid Other | Admitting: Certified Registered"

## 2022-04-24 DIAGNOSIS — Z992 Dependence on renal dialysis: Secondary | ICD-10-CM | POA: Diagnosis not present

## 2022-04-24 DIAGNOSIS — R1013 Epigastric pain: Secondary | ICD-10-CM | POA: Insufficient documentation

## 2022-04-24 DIAGNOSIS — N186 End stage renal disease: Secondary | ICD-10-CM | POA: Insufficient documentation

## 2022-04-24 DIAGNOSIS — I132 Hypertensive heart and chronic kidney disease with heart failure and with stage 5 chronic kidney disease, or end stage renal disease: Secondary | ICD-10-CM | POA: Diagnosis not present

## 2022-04-24 DIAGNOSIS — R112 Nausea with vomiting, unspecified: Secondary | ICD-10-CM | POA: Diagnosis present

## 2022-04-24 DIAGNOSIS — Z79899 Other long term (current) drug therapy: Secondary | ICD-10-CM | POA: Insufficient documentation

## 2022-04-24 DIAGNOSIS — K3189 Other diseases of stomach and duodenum: Secondary | ICD-10-CM | POA: Diagnosis not present

## 2022-04-24 DIAGNOSIS — I509 Heart failure, unspecified: Secondary | ICD-10-CM

## 2022-04-24 DIAGNOSIS — I5022 Chronic systolic (congestive) heart failure: Secondary | ICD-10-CM | POA: Insufficient documentation

## 2022-04-24 DIAGNOSIS — R197 Diarrhea, unspecified: Secondary | ICD-10-CM | POA: Diagnosis not present

## 2022-04-24 DIAGNOSIS — Z794 Long term (current) use of insulin: Secondary | ICD-10-CM | POA: Insufficient documentation

## 2022-04-24 DIAGNOSIS — R634 Abnormal weight loss: Secondary | ICD-10-CM

## 2022-04-24 DIAGNOSIS — E1022 Type 1 diabetes mellitus with diabetic chronic kidney disease: Secondary | ICD-10-CM | POA: Insufficient documentation

## 2022-04-24 DIAGNOSIS — G8929 Other chronic pain: Secondary | ICD-10-CM

## 2022-04-24 HISTORY — PX: ESOPHAGOGASTRODUODENOSCOPY (EGD) WITH PROPOFOL: SHX5813

## 2022-04-24 HISTORY — PX: COLONOSCOPY WITH PROPOFOL: SHX5780

## 2022-04-24 HISTORY — PX: BIOPSY: SHX5522

## 2022-04-24 HISTORY — DX: Chronic kidney disease, unspecified: N18.9

## 2022-04-24 LAB — POCT I-STAT, CHEM 8
BUN: 86 mg/dL — ABNORMAL HIGH (ref 6–20)
Calcium, Ion: 1.05 mmol/L — ABNORMAL LOW (ref 1.15–1.40)
Chloride: 103 mmol/L (ref 98–111)
Creatinine, Ser: 11.8 mg/dL — ABNORMAL HIGH (ref 0.44–1.00)
Glucose, Bld: 135 mg/dL — ABNORMAL HIGH (ref 70–99)
HCT: 36 % (ref 36.0–46.0)
Hemoglobin: 12.2 g/dL (ref 12.0–15.0)
Potassium: 5 mmol/L (ref 3.5–5.1)
Sodium: 140 mmol/L (ref 135–145)
TCO2: 26 mmol/L (ref 22–32)

## 2022-04-24 LAB — GLUCOSE, CAPILLARY
Glucose-Capillary: 111 mg/dL — ABNORMAL HIGH (ref 70–99)
Glucose-Capillary: 159 mg/dL — ABNORMAL HIGH (ref 70–99)

## 2022-04-24 SURGERY — COLONOSCOPY WITH PROPOFOL
Anesthesia: Monitor Anesthesia Care

## 2022-04-24 MED ORDER — SODIUM CHLORIDE 0.9 % IV SOLN
INTRAVENOUS | Status: AC | PRN
  Administered 2022-04-24: 500 mL via INTRAVENOUS

## 2022-04-24 MED ORDER — SODIUM CHLORIDE 0.9 % IV SOLN: INTRAVENOUS | Status: DC

## 2022-04-24 MED ORDER — PROPOFOL 500 MG/50ML IV EMUL
INTRAVENOUS | Status: DC | PRN
  Administered 2022-04-24: 200 ug/kg/min via INTRAVENOUS

## 2022-04-24 MED ORDER — PROPOFOL 10 MG/ML IV BOLUS
INTRAVENOUS | Status: DC | PRN
  Administered 2022-04-24: 25 ug/kg/min via INTRAVENOUS

## 2022-04-24 MED ORDER — LIDOCAINE 2% (20 MG/ML) 5 ML SYRINGE
INTRAMUSCULAR | Status: DC | PRN
  Administered 2022-04-24: 50 mg via INTRAVENOUS

## 2022-04-24 SURGICAL SUPPLY — 25 items

## 2022-04-24 NOTE — Transfer of Care (Signed)
Immediate Anesthesia Transfer of Care Note  Patient: Kathy Lewis  Procedure(s) Performed: COLONOSCOPY WITH PROPOFOL ESOPHAGOGASTRODUODENOSCOPY (EGD) WITH PROPOFOL BIOPSY  Patient Location: PACU  Anesthesia Type:MAC  Level of Consciousness: drowsy  Airway & Oxygen Therapy: Patient Spontanous Breathing and Patient connected to nasal cannula oxygen  Post-op Assessment: Report given to RN and Post -op Vital signs reviewed and stable  Post vital signs: Reviewed and stable  Last Vitals:  Vitals Value Taken Time  BP    Temp    Pulse    Resp    SpO2      Last Pain:  Vitals:   04/24/22 0655  PainSc: 0-No pain         Complications: No notable events documented.

## 2022-04-24 NOTE — Op Note (Signed)
[facility] Patient Name: Kathy Lewis Procedure Date : 04/24/2022 MRN: [id number] Attending MD: Gladstone Pih. Candis Schatz , MD Date of Birth: Jan 08, 1996 CSN: [id number] Age: 26 Admit Type: Inpatient Procedure:                Upper GI endoscopy Indications:              Epigastric abdominal pain, Nausea with vomiting Providers:                Nicki Reaper E. Candis Schatz, MD, Benay Pillow, RN,                            William Dalton, Technician Referring MD:              Medicines:                Monitored Anesthesia Care Complications:            No immediate complications. Estimated Blood Loss:     Estimated blood loss was minimal. Procedure:                Pre-Anesthesia Assessment:                           - Prior to the procedure, a History and Physical                            was performed, and patient medications and                            allergies were reviewed. The patient's tolerance of                            previous anesthesia was also reviewed. The risks                            and benefits of the procedure and the sedation                            options and risks were discussed with the patient.                            All questions were answered, and informed consent                            was obtained. Prior Anticoagulants: The patient has                            taken no previous anticoagulant or antiplatelet                            agents. ASA Grade Assessment: III - A patient with                            severe systemic disease. After reviewing the risks  and benefits, the patient was deemed in                            satisfactory condition to undergo the procedure.                           After obtaining informed consent, the endoscope was                            passed under direct vision. Throughout the                            procedure, the patient's blood pressure, pulse, and                             oxygen saturations were monitored continuously. The                            [id number]-H190 ([id number]) Olympus endoscope was introduced                            through the mouth, and advanced to the third part                            of duodenum. The upper GI endoscopy was                            accomplished without difficulty. The patient                            tolerated the procedure fairly well. Scope In: Scope Out: Findings:      The examined portions of the nasopharynx, oropharynx and larynx were       normal.      The examined esophagus was normal.      Patchy mildly erythematous mucosa without bleeding was found in the       gastric body. Biopsies were taken with a cold forceps for Helicobacter       pylori testing. Estimated blood loss was minimal.      The exam of the stomach was otherwise normal.      Moderately friable mucosa without active bleeding was found in the       second portion of the duodenum (excessive bleeding with biopsies,       contact oozing). Biopsies for histology were taken with a cold forceps       for evaluation of celiac disease. Estimated blood loss was minimal.      The exam of the duodenum was otherwise normal. Impression:               - The examined portions of the nasopharynx,                            oropharynx and larynx were normal.                           - Normal esophagus.                           -  Erythematous mucosa in the gastric body. Biopsied.                           - Friable duodenal mucosa. Biopsied.                           - No endoscopic abnormalities to explain patient's                            nausea, vomiting or abdominal pain.                           - Symptoms seem most consistent with cyclic                            vomiting syndrome Moderate Sedation:      none Recommendation:           - Patient has a contact number available for                            emergencies. The signs  and symptoms of potential                            delayed complications were discussed with the                            patient. Return to normal activities tomorrow.                            Written discharge instructions were provided to the                            patient.                           - Resume previous diet.                           - Continue present medications.                           - Await pathology results.                           - Consider trial of sumatriptan 25 mg PO PRN for                            subsequent episodes Procedure Code(s):        --- Professional ---                           [phone], Esophagogastroduodenoscopy, flexible,                            transoral; with biopsy, single or multiple Diagnosis Code(s):        --- Professional ---  K31.89, Other diseases of stomach and duodenum                           R10.13, Epigastric pain                           R11.2, Nausea with vomiting, unspecified CPT copyright 2019 American Medical Association. All rights reserved. The codes documented in this report are preliminary and upon coder review may  be revised to meet current compliance requirements. [name], MD 04/24/2022 8:17:09 AM This report has been signed electronically. Number of Addenda: 0

## 2022-04-24 NOTE — Anesthesia Preprocedure Evaluation (Signed)
Anesthesia Evaluation  Patient identified by MRN, date of birth, ID band Patient awake    Reviewed: Allergy & Precautions, H&P , NPO status , Patient's Chart, lab work & pertinent test results  Airway Mallampati: II   Neck ROM: full    Dental   Pulmonary neg pulmonary ROS,    breath sounds clear to auscultation       Cardiovascular hypertension, +CHF   Rhythm:regular Rate:Normal     Neuro/Psych  Headaches, PSYCHIATRIC DISORDERS Anxiety Depression Bipolar Disorder    GI/Hepatic   Endo/Other  diabetes, Type 1  Renal/GU ESRF and DialysisRenal disease     Musculoskeletal   Abdominal   Peds  Hematology   Anesthesia Other Findings   Reproductive/Obstetrics                             Anesthesia Physical Anesthesia Plan  ASA: 3  Anesthesia Plan: MAC   Post-op Pain Management:    Induction: Intravenous  PONV Risk Score and Plan: 2 and Propofol infusion and Treatment may vary due to age or medical condition  Airway Management Planned: Nasal Cannula  Additional Equipment:   Intra-op Plan:   Post-operative Plan:   Informed Consent: I have reviewed the patients History and Physical, chart, labs and discussed the procedure including the risks, benefits and alternatives for the proposed anesthesia with the patient or authorized representative who has indicated his/her understanding and acceptance.     Dental advisory given  Plan Discussed with: CRNA, Anesthesiologist and Surgeon  Anesthesia Plan Comments:         Anesthesia Quick Evaluation

## 2022-04-24 NOTE — Anesthesia Postprocedure Evaluation (Signed)
Anesthesia Post Note  Patient: Kathy Lewis  Procedure(s) Performed: COLONOSCOPY WITH PROPOFOL ESOPHAGOGASTRODUODENOSCOPY (EGD) WITH PROPOFOL BIOPSY     Patient location during evaluation: PACU Anesthesia Type: MAC Level of consciousness: awake and alert Pain management: pain level controlled Vital Signs Assessment: post-procedure vital signs reviewed and stable Respiratory status: spontaneous breathing, nonlabored ventilation, respiratory function stable and patient connected to nasal cannula oxygen Cardiovascular status: stable and blood pressure returned to baseline Postop Assessment: no apparent nausea or vomiting Anesthetic complications: no   No notable events documented.  Last Vitals:  Vitals:   04/24/22 0815 04/24/22 0835  BP: 127/81 (!) 117/91  Pulse: 93 91  Resp: 20 15  Temp: 36.6 C (!) 36.2 C  SpO2: 99% 94%    Last Pain:  Vitals:   04/24/22 0835  PainSc: 0-No pain                 Kevante Lunt S

## 2022-04-24 NOTE — Interval H&P Note (Signed)
History and Physical Interval Note:  04/24/2022 7:20 AM  Kathy Lewis  has presented today for surgery, with the diagnosis of nasuea/vomiting, diarrhea, epigastric pain, weight loss, esrd.  The various methods of treatment have been discussed with the patient and family. After consideration of risks, benefits and other options for treatment, the patient has consented to  Procedure(s): COLONOSCOPY WITH PROPOFOL (N/A) ESOPHAGOGASTRODUODENOSCOPY (EGD) WITH PROPOFOL (N/A) as a surgical intervention.  The patient's history has been reviewed, patient examined, no change in status, stable for surgery.  I have reviewed the patient's chart and labs.  Questions were answered to the patient's satisfaction.    Patient's symptoms stable since her clinic visit.  [name]

## 2022-04-24 NOTE — Anesthesia Procedure Notes (Signed)
Procedure Name: MAC Date/Time: 04/24/2022 7:51 AM  Performed by: Imagene Riches, CRNAPre-anesthesia Checklist: Patient identified, Emergency Drugs available, Suction available, Patient being monitored and Timeout performed Patient Re-evaluated:Patient Re-evaluated prior to induction Oxygen Delivery Method: Nasal cannula

## 2022-04-24 NOTE — Op Note (Signed)
[facility] Patient Name: Kathy Lewis Procedure Date : 04/24/2022 MRN: [id number] Attending MD: Gladstone Pih. Candis Schatz , MD Date of Birth: 1995-10-18 CSN: [id number] Age: 26 Admit Type: Inpatient Procedure:                Colonoscopy Indications:              Clinically significant diarrhea of unexplained                            origin Providers:                Nicki Reaper E. Candis Schatz, MD, Benay Pillow, RN,                            William Dalton, Technician Referring MD:              Medicines:                Monitored Anesthesia Care Complications:            No immediate complications. Estimated Blood Loss:     Estimated blood loss: none. Procedure:                Pre-Anesthesia Assessment:                           - Prior to the procedure, a History and Physical                            was performed, and patient medications and                            allergies were reviewed. The patient's tolerance of                            previous anesthesia was also reviewed. The risks                            and benefits of the procedure and the sedation                            options and risks were discussed with the patient.                            All questions were answered, and informed consent                            was obtained. Prior Anticoagulants: The patient has                            taken no previous anticoagulant or antiplatelet                            agents. ASA Grade Assessment: III - A patient with                            severe systemic disease.  After reviewing the risks                            and benefits, the patient was deemed in                            satisfactory condition to undergo the procedure.                           - Prior to the procedure, a History and Physical                            was performed, and patient medications and                            allergies were reviewed. The patient's  tolerance of                            previous anesthesia was also reviewed. The risks                            and benefits of the procedure and the sedation                            options and risks were discussed with the patient.                            All questions were answered, and informed consent                            was obtained. Prior Anticoagulants: The patient has                            taken no previous anticoagulant or antiplatelet                            agents. ASA Grade Assessment: III - A patient with                            severe systemic disease. After reviewing the risks                            and benefits, the patient was deemed in                            satisfactory condition to undergo the procedure.                           After obtaining informed consent, the colonoscope                            was passed under direct vision. Throughout the  procedure, the patient's blood pressure, pulse, and                            oxygen saturations were monitored continuously. The                            CF-HQ190L ([id number]) Olympus colonoscope was                            introduced through the anus with the intention of                            advancing to the cecum. The scope was advanced to                            the rectum before the procedure was aborted.                            Medications were given. The colonoscopy was                            performed without difficulty. The patient tolerated                            the procedure well. The quality of the bowel                            preparation was poor. Scope In: 8:06:24 AM Scope Out: 8:07:20 AM Total Procedure Duration: 0 hours 0 minutes 56 seconds  Findings:      The perianal and digital rectal examinations were normal. Pertinent       negatives include normal sphincter tone and no palpable rectal lesions.       Copious  soft solid stool palpated in rectal vault      There was extensive solid stool in the rectum which prevented       advancement of the scope. Very small portions of the rectal mucosa were       visualized and appeared normal. Impression:               - Preparation of the colon was extremely poor.                           - The distal rectum is normal.                           - No specimens collected. Moderate Sedation:      none Recommendation:           - Patient has a contact number available for                            emergencies. The signs and symptoms of potential                            delayed complications were discussed with the  patient. Return to normal activities tomorrow.                            Written discharge instructions were provided to the                            patient.                           - Resume previous diet.                           - Continue present medications.                           - Will discuss repeating colonoscopy at a later                            date. Procedure Code(s):        --- Professional ---                           [phone], 50, Colonoscopy, flexible; diagnostic,                            including collection of specimen(s) by brushing or                            washing, when performed (separate procedure) Diagnosis Code(s):        --- Professional ---                           R19.7, Diarrhea, unspecified CPT copyright 2019 American Medical Association. All rights reserved. The codes documented in this report are preliminary and upon coder review may  be revised to meet current compliance requirements. Averi Kilty E. Candis Schatz, MD 04/24/2022 8:21:45 AM This report has been signed electronically. Number of Addenda: 0

## 2022-04-25 ENCOUNTER — Telehealth: Payer: Self-pay

## 2022-04-25 LAB — SURGICAL PATHOLOGY

## 2022-04-25 NOTE — Telephone Encounter (Signed)
-----   Message from [name], MD sent at 04/24/2022  8:47 AM EDT ----- Regarding: Clinic follow up Vaughan Basta,  Can you set up a follow up clinic visit with me for Ms. Aiello?  Thanks

## 2022-04-25 NOTE — Progress Notes (Signed)
Kathy Lewis,  The biopsies of your stomach and duodenum were completely normal with no evidence of inflammatory changes and no evidence of H. pylori infection.  As discussed, your symptoms are most likely related to a condition called cyclic vomiting syndrome.  Please follow-up with me in clinic and we can discuss further management of this condition.

## 2022-04-25 NOTE — Telephone Encounter (Signed)
Pt scheduled to see Dr. Candis Schatz 05/25/22@11 :10am. Appt letter mailed to pt.

## 2022-04-26 ENCOUNTER — Telehealth: Payer: Self-pay | Admitting: Gastroenterology

## 2022-04-26 ENCOUNTER — Emergency Department (HOSPITAL_BASED_OUTPATIENT_CLINIC_OR_DEPARTMENT_OTHER): Payer: Medicaid Other

## 2022-04-26 ENCOUNTER — Encounter (HOSPITAL_COMMUNITY): Payer: Self-pay | Admitting: Gastroenterology

## 2022-04-26 ENCOUNTER — Emergency Department (HOSPITAL_BASED_OUTPATIENT_CLINIC_OR_DEPARTMENT_OTHER)
Admission: EM | Admit: 2022-04-26 | Discharge: 2022-04-26 | Discharge status: Against Medical Advice | Payer: Medicaid Other | Attending: Emergency Medicine | Admitting: Emergency Medicine

## 2022-04-26 DIAGNOSIS — N186 End stage renal disease: Secondary | ICD-10-CM | POA: Diagnosis not present

## 2022-04-26 DIAGNOSIS — E875 Hyperkalemia: Secondary | ICD-10-CM

## 2022-04-26 DIAGNOSIS — Z794 Long term (current) use of insulin: Secondary | ICD-10-CM | POA: Diagnosis not present

## 2022-04-26 DIAGNOSIS — R030 Elevated blood-pressure reading, without diagnosis of hypertension: Secondary | ICD-10-CM | POA: Insufficient documentation

## 2022-04-26 DIAGNOSIS — R109 Unspecified abdominal pain: Secondary | ICD-10-CM | POA: Diagnosis present

## 2022-04-26 DIAGNOSIS — E876 Hypokalemia: Secondary | ICD-10-CM | POA: Diagnosis not present

## 2022-04-26 DIAGNOSIS — R112 Nausea with vomiting, unspecified: Secondary | ICD-10-CM

## 2022-04-26 DIAGNOSIS — Z992 Dependence on renal dialysis: Secondary | ICD-10-CM | POA: Diagnosis not present

## 2022-04-26 LAB — CBC WITH DIFFERENTIAL/PLATELET
Abs Immature Granulocytes: 0.02 10*3/uL (ref 0.00–0.07)
Basophils Absolute: 0 10*3/uL (ref 0.0–0.1)
Basophils Relative: 1 %
Eosinophils Absolute: 0 10*3/uL (ref 0.0–0.5)
Eosinophils Relative: 0 %
HCT: 32.3 % — ABNORMAL LOW (ref 36.0–46.0)
Hemoglobin: 10.7 g/dL — ABNORMAL LOW (ref 12.0–15.0)
Immature Granulocytes: 1 %
Lymphocytes Relative: 14 %
Lymphs Abs: 0.6 10*3/uL — ABNORMAL LOW (ref 0.7–4.0)
MCH: 30.6 pg (ref 26.0–34.0)
MCHC: 33.1 g/dL (ref 30.0–36.0)
MCV: 92.3 fL (ref 80.0–100.0)
Monocytes Absolute: 0.2 10*3/uL (ref 0.1–1.0)
Monocytes Relative: 5 %
Neutro Abs: 3.2 10*3/uL (ref 1.7–7.7)
Neutrophils Relative %: 79 %
Platelets: 181 10*3/uL (ref 150–400)
RBC: 3.5 MIL/uL — ABNORMAL LOW (ref 3.87–5.11)
RDW: 13.6 % (ref 11.5–15.5)
WBC: 4.1 10*3/uL (ref 4.0–10.5)
nRBC: 0 % (ref 0.0–0.2)

## 2022-04-26 LAB — COMPREHENSIVE METABOLIC PANEL
ALT: 20 U/L (ref 0–44)
AST: 21 U/L (ref 15–41)
Albumin: 3.1 g/dL — ABNORMAL LOW (ref 3.5–5.0)
Alkaline Phosphatase: 60 U/L (ref 38–126)
Anion gap: 17 — ABNORMAL HIGH (ref 5–15)
BUN: 94 mg/dL — ABNORMAL HIGH (ref 6–20)
CO2: 18 mmol/L — ABNORMAL LOW (ref 22–32)
Calcium: 8.3 mg/dL — ABNORMAL LOW (ref 8.9–10.3)
Chloride: 101 mmol/L (ref 98–111)
Creatinine, Ser: 12.39 mg/dL — ABNORMAL HIGH (ref 0.44–1.00)
GFR, Estimated: 4 mL/min — ABNORMAL LOW (ref >60)
Glucose, Bld: 157 mg/dL — ABNORMAL HIGH (ref 70–99)
Potassium: 5.5 mmol/L — ABNORMAL HIGH (ref 3.5–5.1)
Sodium: 136 mmol/L (ref 135–145)
Total Bilirubin: 0.9 mg/dL (ref 0.3–1.2)
Total Protein: 6.1 g/dL — ABNORMAL LOW (ref 6.5–8.1)

## 2022-04-26 LAB — LIPASE, BLOOD: Lipase: 21 U/L (ref 11–51)

## 2022-04-26 MED ORDER — DIPHENHYDRAMINE HCL 50 MG/ML IJ SOLN
12.5000 mg | Freq: Once | INTRAMUSCULAR | Status: AC
  Administered 2022-04-26: 12.5 mg via INTRAVENOUS
  Filled 2022-04-26: qty 1

## 2022-04-26 MED ORDER — SODIUM CHLORIDE 0.9 % IV BOLUS
1000.0000 mL | Freq: Once | INTRAVENOUS | Status: AC
  Administered 2022-04-26: 1000 mL via INTRAVENOUS

## 2022-04-26 MED ORDER — METOCLOPRAMIDE HCL 5 MG/ML IJ SOLN
10.0000 mg | Freq: Once | INTRAMUSCULAR | Status: AC
  Administered 2022-04-26: 10 mg via INTRAVENOUS
  Filled 2022-04-26: qty 2

## 2022-04-26 MED ORDER — ALUM & MAG HYDROXIDE-SIMETH 200-200-20 MG/5ML PO SUSP
30.0000 mL | Freq: Once | ORAL | Status: AC
  Administered 2022-04-26: 30 mL via ORAL
  Filled 2022-04-26: qty 30

## 2022-04-26 MED ORDER — DEXTROSE 10 % IV SOLN: Freq: Once | INTRAVENOUS | Status: DC

## 2022-04-26 MED ORDER — SODIUM ZIRCONIUM CYCLOSILICATE 10 G PO PACK: 10.0000 g | PACK | Freq: Every day | ORAL | Status: DC

## 2022-04-26 NOTE — ED Provider Notes (Signed)
[facility] EMERGENCY DEPARTMENT Provider Note   CSN: [id number] Arrival date & time: 04/26/22  1016     History  Chief Complaint  Patient presents with   Abdominal Pain    Kathy Lewis is a 26 y.o. female.  Patient is a 26 year old female with end-stage renal disease on peritoneal dialysis presenting for abdominal pain.  Patient has been dealing with abdominal pain, nausea, vomiting, diarrhea for several months.  She has been seen by GI specialist and had a upper endoscopy procedure 2 days ago demonstrating normal findings.  No gastritis.  No ulcers.  No signs of H. pylori.  Their final diagnosis was cyclical vomiting syndrome.  She admits to significant worsening abdominal pain at this time.  Nausea and vomiting not improved with home medications.  The history is provided by the patient. No language interpreter was used.  Abdominal Pain Associated symptoms: diarrhea, nausea and vomiting   Associated symptoms: no chest pain, no chills, no cough, no dysuria, no fever, no hematuria, no shortness of breath and no sore throat        Home Medications Prior to Admission medications   Medication Sig Start Date End Date Taking? Authorizing Provider  acetaminophen (TYLENOL) 500 MG tablet Take 1,000 mg by mouth every 6 (six) hours as needed for headache (pain).    [provider]  atorvastatin (LIPITOR) 40 MG tablet Take 40 mg by mouth in the morning and at bedtime.    [provider]  butalbital-acetaminophen-caffeine (FIORICET) 50-325-40 MG tablet Take 1 tablet by mouth daily as needed for headache.    [provider]  calcitRIOL (ROCALTROL) 0.25 MCG capsule Take 0.25 mcg by mouth every morning. 08/03/21   [provider]  calcium carbonate (TUMS - DOSED IN MG ELEMENTAL CALCIUM) 500 MG chewable tablet Take 2 tablets by mouth 3 (three) times daily with meals.    [provider]  carvedilol (COREG) 12.5 MG tablet Take 12.5 mg by mouth 2  (two) times daily with a meal.    [provider]  clonazePAM (KLONOPIN) 0.5 MG tablet Take 1 tablet (0.5 mg total) by mouth 2 (two) times daily as needed for anxiety. Patient taking differently: Take 0.5 mg by mouth 2 (two) times daily. 03/16/22   [name], MD  hydrALAZINE (APRESOLINE) 100 MG tablet Take 100 mg by mouth 3 (three) times daily. 08/05/21   [provider]  insulin lispro (HUMALOG) 100 UNIT/ML injection Inject into the skin continuous. Via Insulin Pump 01/08/22   [provider]  isosorbide mononitrate (IMDUR) 30 MG 24 hr tablet Take 30 mg by mouth in the morning and at bedtime.    [provider]  magnesium oxide (MAG-OX) 400 MG tablet Take 400 mg by mouth 2 (two) times daily. 08/03/21   [provider]  methocarbamol (ROBAXIN) 500 MG tablet Take 500 mg by mouth every 8 (eight) hours as needed for muscle spasms.    [provider]  metoCLOPramide (REGLAN) 10 MG tablet Take 1 tablet (10 mg total) by mouth every 8 (eight) hours as needed for nausea. 01/17/22   Lucrezia Starch, MD  ondansetron (ZOFRAN) 4 MG tablet Take 1 tablet (4 mg total) by mouth every 6 (six) hours. Patient taking differently: Take 4 mg by mouth every 6 (six) hours as needed for nausea or vomiting. 04/11/22   [name], PA  pantoprazole (PROTONIX) 40 MG tablet Take 1 tablet (40 mg total) by mouth 2 (two) times daily before  a meal. 04/11/22   Lemmon, Lavone Nian, PA  prochlorperazine (COMPAZINE) 5 MG tablet Take 1 tablet (5 mg total) by mouth every 6 (six) hours as needed for nausea or vomiting. 01/21/22   Regan Lemming, MD  promethazine (PHENERGAN) 25 MG suppository Place 1 suppository (25 mg total) rectally as needed for nausea or vomiting. 04/11/22   [name], PA  sacubitril-valsartan (ENTRESTO) 49-51 MG Take 1 tablet by mouth 2 (two) times daily.    [provider]  Vitamin D, Ergocalciferol, (DRISDOL) 1.25 MG (50000  UNIT) CAPS capsule Take 50,000 Units by mouth every Sunday.    [provider]  escitalopram (LEXAPRO) 10 MG tablet Take 20 mg by mouth daily.  05/10/20 10/06/20  [provider]  furosemide (LASIX) 40 MG tablet Take 1 tablet (40 mg total) by mouth daily. Patient not taking: No sig reported 10/28/19 02/17/21  [name], MD  lisinopril (ZESTRIL) 10 MG tablet Take 10 mg by mouth daily.  02/23/20 10/06/20  [provider]  spironolactone (ALDACTONE) 25 MG tablet Take 25 mg by mouth daily.  04/20/20 10/06/20  [provider]      Allergies    Cantaloupe extract allergy skin test, Strawberry extract, Citrullus vulgaris, and Nsaids    Review of Systems   Review of Systems  Constitutional:  Negative for chills and fever.  HENT:  Negative for ear pain and sore throat.   Eyes:  Negative for pain and visual disturbance.  Respiratory:  Negative for cough and shortness of breath.   Cardiovascular:  Negative for chest pain and palpitations.  Gastrointestinal:  Positive for abdominal pain, diarrhea, nausea and vomiting.  Genitourinary:  Negative for dysuria and hematuria.  Musculoskeletal:  Negative for arthralgias and back pain.  Skin:  Negative for color change and rash.  Neurological:  Negative for seizures and syncope.  All other systems reviewed and are negative.   Physical Exam Updated Vital Signs BP (!) 157/121   Pulse 95   Temp 97.7 F (36.5 C) (Oral)   Resp 15   Ht 5' (1.524 m)   Wt 61.7 kg   SpO2 98%   BMI 26.56 kg/m  Physical Exam Vitals and nursing note reviewed.  Constitutional:      General: She is not in acute distress.    Appearance: She is well-developed.  HENT:     Head: Normocephalic and atraumatic.  Eyes:     Conjunctiva/sclera: Conjunctivae normal.  Cardiovascular:     Rate and Rhythm: Normal rate and regular rhythm.     Heart sounds: No murmur heard. Pulmonary:     Effort: Pulmonary effort is normal. No respiratory distress.      Breath sounds: Normal breath sounds.  Abdominal:     Palpations: Abdomen is soft.     Tenderness: There is generalized abdominal tenderness. There is no guarding or rebound.  Musculoskeletal:        General: No swelling.     Cervical back: Neck supple.  Skin:    General: Skin is warm and dry.     Capillary Refill: Capillary refill takes less than 2 seconds.  Neurological:     Mental Status: She is alert.  Psychiatric:        Mood and Affect: Mood normal.     ED Results / Procedures / Treatments   Labs (all labs ordered are listed, but only abnormal results are displayed) Labs Reviewed  CBC WITH DIFFERENTIAL/PLATELET - Abnormal; Notable for the following components:  Result Value   RBC 3.50 (*)    Hemoglobin 10.7 (*)    HCT 32.3 (*)    Lymphs Abs 0.6 (*)    All other components within normal limits  COMPREHENSIVE METABOLIC PANEL - Abnormal; Notable for the following components:   Potassium 5.5 (*)    CO2 18 (*)    Glucose, Bld 157 (*)    BUN 94 (*)    Creatinine, Ser 12.39 (*)    Calcium 8.3 (*)    Total Protein 6.1 (*)    Albumin 3.1 (*)    GFR, Estimated 4 (*)    Anion gap 17 (*)    All other components within normal limits  LIPASE, BLOOD    EKG None  Radiology CT ABDOMEN PELVIS WO CONTRAST  Result Date: 04/26/2022 CLINICAL DATA:  Acute generalized abdominal pain. EXAM: CT ABDOMEN AND PELVIS WITHOUT CONTRAST TECHNIQUE: Multidetector CT imaging of the abdomen and pelvis was performed following the standard protocol without IV contrast. RADIATION DOSE REDUCTION: This exam was performed according to the departmental dose-optimization program which includes automated exposure control, adjustment of the mA and/or kV according to patient size and/or use of iterative reconstruction technique. COMPARISON:  March 15, 2022. FINDINGS: Lower chest: No acute abnormality. Hepatobiliary: No focal liver abnormality is seen. Hepatomegaly is noted. Hepatic steatosis. No  gallstones, gallbladder wall thickening, or biliary dilatation. Pancreas: Unremarkable. No pancreatic ductal dilatation or surrounding inflammatory changes. Spleen: Normal in size without focal abnormality. Adrenals/Urinary Tract: Adrenal glands are unremarkable. Kidneys are normal, without renal calculi, focal lesion, or hydronephrosis. Bladder is unremarkable. Stomach/Bowel: Stomach is within normal limits. Appendix appears normal. No evidence of bowel wall thickening, distention, or inflammatory changes. Vascular/Lymphatic: No significant vascular findings are present. No enlarged abdominal or pelvic lymph nodes. Reproductive: Uterus and bilateral adnexa are unremarkable. Other: Distal tip of peritoneal dialysis catheter is noted in the pelvis. Mild ascites is noted consistent with history of peritoneal dialysis. No definite hernia is noted. Musculoskeletal: No acute or significant osseous findings. IMPRESSION: Hepatomegaly is noted.  Hepatic steatosis is noted. Distal tip of peritoneal dialysis catheter is noted in the pelvis. Mild ascites is noted consistent with history of peritoneal dialysis. Electronically Signed   By: Marijo Conception M.D.   On: 04/26/2022 12:31    Procedures Procedures    Medications Ordered in ED Medications  sodium chloride 0.9 % bolus 1,000 mL ( Intravenous Stopped 04/26/22 1418)  metoCLOPramide (REGLAN) injection 10 mg (10 mg Intravenous Given 04/26/22 1141)  diphenhydrAMINE (BENADRYL) injection 12.5 mg (12.5 mg Intravenous Given 04/26/22 1139)  alum & mag hydroxide-simeth (MAALOX/MYLANTA) 200-200-20 MG/5ML suspension 30 mL (30 mLs Oral Given 04/26/22 1147)    ED Course/ Medical Decision Making/ A&P                           Medical Decision Making Amount and/or Complexity of Data Reviewed Labs: ordered. Radiology: ordered.  Risk OTC drugs. Prescription drug management.   19:43 PM  26 year old female with end-stage renal disease on peritoneal dialysis presenting  for abdominal pain.  Patient is alert and oriented x3, no acute distress, afebrile, stable vital signs.  Patient resting comfortably in bed at this time.  Skull exam demonstrate soft abdomen with generalized tenderness.  No guarding, distention, rigidity.  Laboratory studies concerning for hyperkalemia.  CT scan demonstrates no acute process.  Patient given medication for symptomatic management of nausea and vomiting.  Hyperkalemia protocol ordered however patient declining  treatment at this time.  She was recommended for admission for a potassium level of 5.5 without EKG changes.  Findings likely secondary to end-stage renal disease.  Patient declining treatment at this time, declining admission, and requesting discharge home.  The patient has requested to leave the ED against medical advice. I believe this patient is of sound mind and medical decision making capacity to refuse medical care. The patient is responding and asking questions appropriately. The patient is oriented to person, place and time. The patient is not psychotic, delusional, suicidal, homicidal or hallucinating. The patient demonstrates a normal mental capacity to make decisions regarding their healthcare. The patient is clinically sober and does not appear to be under the influence of any illicit drugs at this time. The patient has been advised of the risks, in layman terms, of leaving AMA which include, but are not limited to death, coma, permanent disability, loss of current lifestyle, delay in diagnosis. Alternatives have been offered - the patient remains steadfast in their wish to leave. The patient has been advised that should they change their mind they are welcome to return to this hospital, or any other, at any time. The patient understands that in no way does an [facility] discharge mean that I do not want them to have the best medical care available. To this end, I have offered appropriate prescriptions, referrals, and discharge  instructions. The patient did sign AMA paperwork. The above discussion was witnessed by another member of staff.        Final Clinical Impression(s) / ED Diagnoses Final diagnoses:  Hyperkalemia  Nausea and vomiting, unspecified vomiting type  Elevated blood pressure reading  End-stage renal disease on peritoneal dialysis [facility])    Rx / DC Orders ED Discharge Orders     None         Lianne Cure, DO 81/15/72 1441

## 2022-04-26 NOTE — Discharge Instructions (Signed)
Return to emergency department immediately for any worsening concerning signs or symptoms today or if you change your mind and want to be admitted for hyperkalemia

## 2022-04-26 NOTE — ED Notes (Signed)
Patient transported to CT 

## 2022-04-26 NOTE — ED Triage Notes (Signed)
States had EGD & colonoscopy on 8/28. C/o abdominal pain, vomiting & diarrhea. Unable to tolerate PO.

## 2022-04-26 NOTE — Telephone Encounter (Signed)
Patient called, had EGD and colon on 8/28, states she's having severe diarrhea and vomiting a lot. Please call to advise.

## 2022-04-26 NOTE — Telephone Encounter (Signed)
Dr. Candis Schatz just got message and attempted to call pt. Pt is currently in the ER.

## 2022-05-25 ENCOUNTER — Encounter: Payer: Self-pay | Admitting: Gastroenterology

## 2022-05-25 ENCOUNTER — Ambulatory Visit (INDEPENDENT_AMBULATORY_CARE_PROVIDER_SITE_OTHER): Payer: Medicaid Other | Admitting: Gastroenterology

## 2022-05-25 VITALS — BP 116/76 | HR 72 | Ht 60.0 in | Wt 130.0 lb

## 2022-05-25 DIAGNOSIS — R197 Diarrhea, unspecified: Secondary | ICD-10-CM

## 2022-05-25 DIAGNOSIS — R112 Nausea with vomiting, unspecified: Secondary | ICD-10-CM

## 2022-05-25 MED ORDER — AMITRIPTYLINE HCL 10 MG PO TABS: 10.0000 mg | ORAL_TABLET | Freq: Every day | ORAL | 1 refills | Status: DC

## 2022-05-25 NOTE — Progress Notes (Signed)
HPI : Kathy Lewis is a very pleasant 26 year old female with a history of type 1 diabetes, end-stage renal disease on peritoneal dialysis and systolic heart failure who presents to clinic for follow-up for severe nausea, vomiting and diarrhea.  She says she started having these symptoms in April of this year.  Prior to that, she denied any chronic GI symptoms.  She denied any major stressors or life changes around that time.  She started peritoneal dialysis in Sept 2022. She has been seen in the ER and admitted twice in the past few months for nausea and vomiting and DKA.  She underwent an EGD August 28 which was completely normal with normal gastric biopsies.  A colonoscopy was also planned, but was incomplete due to copious solid stool in the colon.  A gastric emptying study was normal in June 2023.  She has had multiple normal Cts. Today, she reports ongoing issues with nausea, vomiting and diarrhea.  These symptoms are episodic and she denies having problems in between episodes.  These episodes typically last anywhere from 3 days to a week in duration.  She estimates having 1-2 episodes per month.   Her last episode was 3 days and was characterized by just diarrhea for three days.  She did not have nausea/vomiting, but usually these symptoms go together. When she has diarrhea she will sometimes go >10 times in a day.  She has urgency but not incontinence.  No blood in stool.  She denies significant abdominal pain, but does report bloating and abdominal discomfort She has lost weight since these symptoms started, going from 140lbs to 126lbs. In between the diarrhea episodes she has normal formed bowel movements. She has not identified any dietary triggers for these episodes.  She follows a fluid restricted/low sodium diet at baseline.  Her appetite is good in between episodes.  In between episodes she has bowel movements daily.  Stool formed and solid.  No blood   Past Medical History:   Diagnosis Date   Anemia    Anxiety    Bipolar 2 disorder (HCC)    Chronic kidney disease    Chronic systolic (congestive) heart failure (HCC)    Depression    DKA (diabetic ketoacidoses)    ESRD on peritoneal dialysis ([place])    HSV infection    on valtrex   Hypokalemia    Leukocytosis    Migraine    Noncompliance with medication regimen    Preeclampsia    Severe anemia    Type 1 diabetes mellitus ([place])     EGD Aug 28 - The examined portions of the nasopharynx, oropharynx and larynx were normal. - Normal esophagus. - Erythematous mucosa in the gastric body. Biopsied. - Friable duodenal mucosa. Biopsied. - No endoscopic abnormalities to explain patient's nausea, vomiting or abdominal pain. - Symptoms seem most consistent with cyclic vomiting syndrome  Gastric biopsies normal  Colonoscopy Aug 28 - Aborted due to extensive solid stool in colon.  Past Surgical History:  Procedure Laterality Date   BIOPSY  04/24/2022   Procedure: BIOPSY;  Surgeon: [name], MD;  Location: [facility] ENDOSCOPY;  Service: Gastroenterology;;   CARDIAC CATHETERIZATION     COLONOSCOPY WITH PROPOFOL N/A 04/24/2022   Procedure: COLONOSCOPY WITH PROPOFOL;  Surgeon: [name], MD;  Location: [place];  Service: Gastroenterology;  Laterality: N/A;   DILATION AND EVACUATION N/A 10/22/2019   Procedure: ULTRASOUND GUIDED DILATATION AND EVACUATION;  Surgeon: [name], MD;  Location: [facility] LD ORS;  Service: Gynecology;  Laterality: N/A;   ESOPHAGOGASTRODUODENOSCOPY (EGD) WITH PROPOFOL N/A 04/24/2022   Procedure: ESOPHAGOGASTRODUODENOSCOPY (EGD) WITH PROPOFOL;  Surgeon: [name], MD;  Location: [place];  Service: Gastroenterology;  Laterality: N/A;   NO PAST SURGERIES     peritoneal dialysis catheter insertion     RENAL BIOPSY     Family History  Adopted: Yes  Problem Relation Age of Onset   Heart disease Neg Hx    Social History   Tobacco Use   Smoking status:  Former    Packs/day: 1.00    Years: 2.00    Total pack years: 2.00    Types: Cigars, Cigarettes   Smokeless tobacco: Never  Vaping Use   Vaping Use: Never used  Substance Use Topics   Alcohol use: No   Drug use: No   Current Outpatient Medications  Medication Sig Dispense Refill   acetaminophen (TYLENOL) 500 MG tablet Take 1,000 mg by mouth every 6 (six) hours as needed for headache (pain).     atorvastatin (LIPITOR) 40 MG tablet Take 40 mg by mouth in the morning and at bedtime.     butalbital-acetaminophen-caffeine (FIORICET) 50-325-40 MG tablet Take 1 tablet by mouth daily as needed for headache.     calcitRIOL (ROCALTROL) 0.25 MCG capsule Take 0.25 mcg by mouth every morning.     calcium carbonate (TUMS - DOSED IN MG ELEMENTAL CALCIUM) 500 MG chewable tablet Take 2 tablets by mouth 3 (three) times daily with meals.     carvedilol (COREG) 12.5 MG tablet Take 12.5 mg by mouth 2 (two) times daily with a meal.     clonazePAM (KLONOPIN) 0.5 MG tablet Take 1 tablet (0.5 mg total) by mouth 2 (two) times daily as needed for anxiety. (Patient taking differently: Take 0.5 mg by mouth 2 (two) times daily.) 20 tablet 0   hydrALAZINE (APRESOLINE) 100 MG tablet Take 100 mg by mouth 3 (three) times daily.     insulin lispro (HUMALOG) 100 UNIT/ML injection Inject into the skin continuous. Via Insulin Pump     isosorbide mononitrate (IMDUR) 30 MG 24 hr tablet Take 30 mg by mouth in the morning and at bedtime.     magnesium oxide (MAG-OX) 400 MG tablet Take 400 mg by mouth 2 (two) times daily.     methocarbamol (ROBAXIN) 500 MG tablet Take 500 mg by mouth every 8 (eight) hours as needed for muscle spasms.     metoCLOPramide (REGLAN) 10 MG tablet Take 1 tablet (10 mg total) by mouth every 8 (eight) hours as needed for nausea. 20 tablet 0   ondansetron (ZOFRAN) 4 MG tablet Take 1 tablet (4 mg total) by mouth every 6 (six) hours. (Patient taking differently: Take 4 mg by mouth every 6 (six) hours as needed  for nausea or vomiting.) 90 tablet 3   pantoprazole (PROTONIX) 40 MG tablet Take 1 tablet (40 mg total) by mouth 2 (two) times daily before a meal. 60 tablet 3   prochlorperazine (COMPAZINE) 5 MG tablet Take 1 tablet (5 mg total) by mouth every 6 (six) hours as needed for nausea or vomiting. 30 tablet 0   promethazine (PHENERGAN) 25 MG suppository Place 1 suppository (25 mg total) rectally as needed for nausea or vomiting. 10 each 0   sacubitril-valsartan (ENTRESTO) 49-51 MG Take 1 tablet by mouth 2 (two) times daily.     Vitamin D, Ergocalciferol, (DRISDOL) 1.25 MG (50000 UNIT) CAPS capsule Take 50,000 Units by mouth every Sunday.  No current facility-administered medications for this visit.   Allergies  Allergen Reactions   Cantaloupe Extract Allergy Skin Test Itching    Mouth itching     Strawberry Extract Itching    Mouth itches   Citrullus Vulgaris Itching    Makes mouth itch , ALL melons    Nsaids Other (See Comments)    Avoid per nephrology     Review of Systems: All systems reviewed and negative except where noted in HPI.    CT ABDOMEN PELVIS WO CONTRAST  Result Date: 04/26/2022 CLINICAL DATA:  Acute generalized abdominal pain. EXAM: CT ABDOMEN AND PELVIS WITHOUT CONTRAST TECHNIQUE: Multidetector CT imaging of the abdomen and pelvis was performed following the standard protocol without IV contrast. RADIATION DOSE REDUCTION: This exam was performed according to the departmental dose-optimization program which includes automated exposure control, adjustment of the mA and/or kV according to patient size and/or use of iterative reconstruction technique. COMPARISON:  March 15, 2022. FINDINGS: Lower chest: No acute abnormality. Hepatobiliary: No focal liver abnormality is seen. Hepatomegaly is noted. Hepatic steatosis. No gallstones, gallbladder wall thickening, or biliary dilatation. Pancreas: Unremarkable. No pancreatic ductal dilatation or surrounding inflammatory changes.  Spleen: Normal in size without focal abnormality. Adrenals/Urinary Tract: Adrenal glands are unremarkable. Kidneys are normal, without renal calculi, focal lesion, or hydronephrosis. Bladder is unremarkable. Stomach/Bowel: Stomach is within normal limits. Appendix appears normal. No evidence of bowel wall thickening, distention, or inflammatory changes. Vascular/Lymphatic: No significant vascular findings are present. No enlarged abdominal or pelvic lymph nodes. Reproductive: Uterus and bilateral adnexa are unremarkable. Other: Distal tip of peritoneal dialysis catheter is noted in the pelvis. Mild ascites is noted consistent with history of peritoneal dialysis. No definite hernia is noted. Musculoskeletal: No acute or significant osseous findings. IMPRESSION: Hepatomegaly is noted.  Hepatic steatosis is noted. Distal tip of peritoneal dialysis catheter is noted in the pelvis. Mild ascites is noted consistent with history of peritoneal dialysis. Electronically Signed   By: Marijo Conception M.D.   On: 04/26/2022 12:31    Physical Exam: BP 116/76   Pulse 72   Ht 5' (1.524 m)   Wt 130 lb (59 kg)   SpO2 99%   BMI 25.39 kg/m  Constitutional: Pleasant,well-developed, African American female in no acute distress. HEENT: Normocephalic and atraumatic. Conjunctivae are normal. No scleral icterus. Neck supple.  Cardiovascular: Normal rate, regular rhythm.  Pulmonary/chest: Effort normal and breath sounds normal. No wheezing, rales or rhonchi. Abdominal: Soft, nondistended, nontender. Bowel sounds active throughout. There are no masses palpable. No hepatomegaly.  Peritoneal dialysis catheter in place, no erythema, drainage.  Insulin pump in place Extremities: no edema Neurological: Alert and oriented to person place and time. Skin: Skin is warm and dry. No rashes noted. Psychiatric: Normal mood and affect. Behavior is normal.  CBC    Component Value Date/Time   WBC 4.1 04/26/2022 1135   RBC 3.50 (L)  04/26/2022 1135   HGB 10.7 (L) 04/26/2022 1135   HCT 32.3 (L) 04/26/2022 1135   HCT 23.0 (L) 10/18/2019 0534   PLT 181 04/26/2022 1135   MCV 92.3 04/26/2022 1135   MCH 30.6 04/26/2022 1135   MCHC 33.1 04/26/2022 1135   RDW 13.6 04/26/2022 1135   LYMPHSABS 0.6 (L) 04/26/2022 1135   MONOABS 0.2 04/26/2022 1135   EOSABS 0.0 04/26/2022 1135   BASOSABS 0.0 04/26/2022 1135    CMP     Component Value Date/Time   NA 136 04/26/2022 1135   K 5.5 (H) 04/26/2022  1135   CL 101 04/26/2022 1135   CO2 18 (L) 04/26/2022 1135   GLUCOSE 157 (H) 04/26/2022 1135   BUN 94 (H) 04/26/2022 1135   CREATININE 12.39 (H) 04/26/2022 1135   CALCIUM 8.3 (L) 04/26/2022 1135   PROT 6.1 (L) 04/26/2022 1135   ALBUMIN 3.1 (L) 04/26/2022 1135   AST 21 04/26/2022 1135   ALT 20 04/26/2022 1135   ALKPHOS 60 04/26/2022 1135   BILITOT 0.9 04/26/2022 1135   GFRNONAA 4 (L) 04/26/2022 1135   GFRAA 52 (L) 10/28/2019 1933     ASSESSMENT AND PLAN: 26 year old female with type 1 diabetes, end-stage renal disease on dialysis and history of systolic heart failure, with 5 months of episodic nausea, vomiting and diarrhea.  Patient is asymptomatic in between these episodes.  The episodes are often severe and have resulted in admission to the hospital for dehydration and DKA.  She has had multiple CT scans, a normal gastric emptying study and a normal upper endoscopy.  A colonoscopy was attempted, but was aborted due to copious amounts of solid stool which prohibited visualization.  The visualized mucosa was normal.  She has lost weight as result of the symptoms, about 15 pounds.  A prealbumin earlier this month was well within normal range (30). The recurrent nature of her nausea and vomiting episodes seems most consistent with cyclic vomiting syndrome.  However, diarrhea is not a typical feature of this disorder.  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  We discussed the proposed pathophysiology of IBS, CVS in GBADs in general.  I think a trial of a TCA would be reasonable to see if this may help reduce the severity and frequency of her symptoms.  We will start amitriptyline 10 mg p.o. nightly.  We discussed the potential side effects of TCAs to include constipation, dry mouth, somnolence, urinary retention and QT prolongation. She has been taking PPI for couple months now because of the symptoms.  Her EGD was completely normal, and she does not think that Protonix is helping her.  I would recommend she start weaning off PPI  Episodic nausea, vomiting and diarrhea - Start Elavil 10 mg PO qhs - Wean off PPI, take every other day for 2 weeks then stop  -Follow-up in 8 weeks  Keyonna Comunale E. Candis Schatz, MD [facility]astroenter[facility]     Medicine, [facility]*

## 2022-05-25 NOTE — Patient Instructions (Signed)
_______________________________________________________  If you are age 26 or older, your body mass index should be between 23-30. Your Body mass index is 25.39 kg/m. If this is out of the aforementioned range listed, please consider follow up with your Primary Care Provider.  If you are age 2 or younger, your body mass index should be between 19-25. Your Body mass index is 25.39 kg/m. If this is out of the aformentioned range listed, please consider follow up with your Primary Care Provider.   Start to wean off of Omeprazole take every other day for 2 weeks than stop.  We have sent the following medications to your pharmacy for you to pick up at your convenience: Amitriptyline 10 mg .   The [facility] providers would like to encourage you to use [facility] to communicate with providers for non-urgent requests or questions.  Due to long hold times on the telephone, sending your provider a message by [facility] may be a faster and more efficient way to get a response.  Please allow 48 business hours for a response.  Please remember that this is for non-urgent requests.   It was a pleasure to see you today!  Thank you for trusting me with your gastrointestinal care!    Scott E.Candis Schatz, MD

## 2022-06-26 ENCOUNTER — Encounter (HOSPITAL_COMMUNITY): Payer: Self-pay | Admitting: Emergency Medicine

## 2022-06-26 ENCOUNTER — Other Ambulatory Visit: Payer: Self-pay

## 2022-06-26 ENCOUNTER — Emergency Department (HOSPITAL_COMMUNITY): Payer: Medicaid Other

## 2022-06-26 ENCOUNTER — Observation Stay (HOSPITAL_COMMUNITY)
Admission: EM | Admit: 2022-06-26 | Discharge: 2022-06-27 | Discharge status: Against Medical Advice | Payer: Medicaid Other | Attending: Internal Medicine | Admitting: Internal Medicine

## 2022-06-26 DIAGNOSIS — Z794 Long term (current) use of insulin: Secondary | ICD-10-CM | POA: Diagnosis not present

## 2022-06-26 DIAGNOSIS — E1010 Type 1 diabetes mellitus with ketoacidosis without coma: Secondary | ICD-10-CM | POA: Diagnosis not present

## 2022-06-26 DIAGNOSIS — R112 Nausea with vomiting, unspecified: Secondary | ICD-10-CM | POA: Diagnosis present

## 2022-06-26 DIAGNOSIS — I132 Hypertensive heart and chronic kidney disease with heart failure and with stage 5 chronic kidney disease, or end stage renal disease: Secondary | ICD-10-CM | POA: Insufficient documentation

## 2022-06-26 DIAGNOSIS — Z87891 Personal history of nicotine dependence: Secondary | ICD-10-CM | POA: Diagnosis not present

## 2022-06-26 DIAGNOSIS — E785 Hyperlipidemia, unspecified: Secondary | ICD-10-CM | POA: Diagnosis not present

## 2022-06-26 DIAGNOSIS — R9431 Abnormal electrocardiogram [ECG] [EKG]: Secondary | ICD-10-CM | POA: Diagnosis present

## 2022-06-26 DIAGNOSIS — E875 Hyperkalemia: Secondary | ICD-10-CM | POA: Diagnosis not present

## 2022-06-26 DIAGNOSIS — N186 End stage renal disease: Secondary | ICD-10-CM | POA: Diagnosis not present

## 2022-06-26 DIAGNOSIS — Z992 Dependence on renal dialysis: Secondary | ICD-10-CM | POA: Diagnosis not present

## 2022-06-26 DIAGNOSIS — I5022 Chronic systolic (congestive) heart failure: Secondary | ICD-10-CM | POA: Diagnosis present

## 2022-06-26 DIAGNOSIS — I4581 Long QT syndrome: Secondary | ICD-10-CM | POA: Diagnosis not present

## 2022-06-26 DIAGNOSIS — E109 Type 1 diabetes mellitus without complications: Secondary | ICD-10-CM | POA: Diagnosis present

## 2022-06-26 DIAGNOSIS — I160 Hypertensive urgency: Secondary | ICD-10-CM | POA: Diagnosis not present

## 2022-06-26 DIAGNOSIS — D649 Anemia, unspecified: Secondary | ICD-10-CM | POA: Diagnosis present

## 2022-06-26 DIAGNOSIS — I10 Essential (primary) hypertension: Secondary | ICD-10-CM | POA: Diagnosis present

## 2022-06-26 DIAGNOSIS — E1122 Type 2 diabetes mellitus with diabetic chronic kidney disease: Secondary | ICD-10-CM | POA: Diagnosis not present

## 2022-06-26 DIAGNOSIS — Z79899 Other long term (current) drug therapy: Secondary | ICD-10-CM | POA: Insufficient documentation

## 2022-06-26 LAB — BASIC METABOLIC PANEL
Anion gap: 23 — ABNORMAL HIGH (ref 5–15)
BUN: 91 mg/dL — ABNORMAL HIGH (ref 6–20)
CO2: 14 mmol/L — ABNORMAL LOW (ref 22–32)
Calcium: 7.1 mg/dL — ABNORMAL LOW (ref 8.9–10.3)
Chloride: 98 mmol/L (ref 98–111)
Creatinine, Ser: 12.04 mg/dL — ABNORMAL HIGH (ref 0.44–1.00)
GFR, Estimated: 4 mL/min — ABNORMAL LOW (ref >60)
Glucose, Bld: 597 mg/dL (ref 70–99)
Potassium: 5.5 mmol/L — ABNORMAL HIGH (ref 3.5–5.1)
Sodium: 135 mmol/L (ref 135–145)

## 2022-06-26 LAB — BETA-HYDROXYBUTYRIC ACID: Beta-Hydroxybutyric Acid: 2.91 mmol/L — ABNORMAL HIGH (ref 0.05–0.27)

## 2022-06-26 LAB — CBC WITH DIFFERENTIAL/PLATELET
Abs Immature Granulocytes: 0.05 10*3/uL (ref 0.00–0.07)
Basophils Absolute: 0 10*3/uL (ref 0.0–0.1)
Basophils Relative: 0 %
Eosinophils Absolute: 0 10*3/uL (ref 0.0–0.5)
Eosinophils Relative: 0 %
HCT: 37.3 % (ref 36.0–46.0)
Hemoglobin: 12.1 g/dL (ref 12.0–15.0)
Immature Granulocytes: 1 %
Lymphocytes Relative: 5 %
Lymphs Abs: 0.3 10*3/uL — ABNORMAL LOW (ref 0.7–4.0)
MCH: 31.2 pg (ref 26.0–34.0)
MCHC: 32.4 g/dL (ref 30.0–36.0)
MCV: 96.1 fL (ref 80.0–100.0)
Monocytes Absolute: 0.1 10*3/uL (ref 0.1–1.0)
Monocytes Relative: 2 %
Neutro Abs: 5.5 10*3/uL (ref 1.7–7.7)
Neutrophils Relative %: 92 %
Platelets: 221 10*3/uL (ref 150–400)
RBC: 3.88 MIL/uL (ref 3.87–5.11)
RDW: 14.6 % (ref 11.5–15.5)
WBC: 6 10*3/uL (ref 4.0–10.5)
nRBC: 0 % (ref 0.0–0.2)

## 2022-06-26 LAB — CBG MONITORING, ED
Glucose-Capillary: 543 mg/dL (ref 70–99)
Glucose-Capillary: 543 mg/dL (ref 70–99)
Glucose-Capillary: 554 mg/dL (ref 70–99)

## 2022-06-26 LAB — BLOOD GAS, VENOUS
Acid-base deficit: 9.6 mmol/L — ABNORMAL HIGH (ref 0.0–2.0)
Bicarbonate: 16.5 mmol/L — ABNORMAL LOW (ref 20.0–28.0)
O2 Saturation: 80.3 %
Patient temperature: 37
pCO2, Ven: 36 mmHg — ABNORMAL LOW (ref 44–60)
pH, Ven: 7.27 (ref 7.25–7.43)
pO2, Ven: 56 mmHg — ABNORMAL HIGH (ref 32–45)

## 2022-06-26 LAB — I-STAT CHEM 8, ED
BUN: 81 mg/dL — ABNORMAL HIGH (ref 6–20)
Calcium, Ion: 0.82 mmol/L — CL (ref 1.15–1.40)
Chloride: 101 mmol/L (ref 98–111)
Creatinine, Ser: 13.5 mg/dL — ABNORMAL HIGH (ref 0.44–1.00)
Glucose, Bld: 608 mg/dL (ref 70–99)
HCT: 39 % (ref 36.0–46.0)
Hemoglobin: 13.3 g/dL (ref 12.0–15.0)
Potassium: 5.4 mmol/L — ABNORMAL HIGH (ref 3.5–5.1)
Sodium: 133 mmol/L — ABNORMAL LOW (ref 135–145)
TCO2: 17 mmol/L — ABNORMAL LOW (ref 22–32)

## 2022-06-26 LAB — HCG, QUANTITATIVE, PREGNANCY: hCG, Beta Chain, Quant, S: 2 m[IU]/mL (ref <5)

## 2022-06-26 MED ORDER — INSULIN REGULAR(HUMAN) IN NACL 100-0.9 UT/100ML-% IV SOLN
INTRAVENOUS | Status: DC
  Administered 2022-06-26: 5.5 [IU]/h via INTRAVENOUS
  Filled 2022-06-26: qty 100

## 2022-06-26 MED ORDER — DEXTROSE 50 % IV SOLN: 0.0000 mL | INTRAVENOUS | Status: DC | PRN

## 2022-06-26 MED ORDER — LACTATED RINGERS IV BOLUS
20.0000 mL/kg | Freq: Once | INTRAVENOUS | Status: AC
  Administered 2022-06-26: 1180 mL via INTRAVENOUS

## 2022-06-26 MED ORDER — METOCLOPRAMIDE HCL 5 MG/ML IJ SOLN
10.0000 mg | Freq: Once | INTRAMUSCULAR | Status: AC
  Administered 2022-06-26: 10 mg via INTRAVENOUS
  Filled 2022-06-26: qty 2

## 2022-06-26 MED ORDER — DEXTROSE IN LACTATED RINGERS 5 % IV SOLN: INTRAVENOUS | Status: DC

## 2022-06-26 MED ORDER — LACTATED RINGERS IV SOLN: INTRAVENOUS | Status: DC

## 2022-06-26 MED ORDER — SODIUM CHLORIDE 0.9 % IV BOLUS
1000.0000 mL | Freq: Once | INTRAVENOUS | Status: AC
  Administered 2022-06-26: 1000 mL via INTRAVENOUS

## 2022-06-26 NOTE — Assessment & Plan Note (Signed)
Fluid status need to be managed carefully given DKA but also EF of 20% would appreciate cardiology consult to help with management patient does not make any urine and will need peritoneal dialysis consulted nephrology

## 2022-06-26 NOTE — ED Triage Notes (Signed)
Pt BIB EMS from home with dizziness, N/V today. Pt does peritoneal dialysis. Cbg 578, last insulin was 4units at home. 18 LAC, 250 NS bolus, 4mg  zofran

## 2022-06-26 NOTE — Assessment & Plan Note (Signed)
-   will monitor on tele avoid QT prolonging medications, rehydrate correct electrolytes ? ?

## 2022-06-26 NOTE — Assessment & Plan Note (Signed)
Chronic stable continue to monitor CBC

## 2022-06-26 NOTE — Assessment & Plan Note (Signed)
Allow permissive hypertension 

## 2022-06-26 NOTE — Assessment & Plan Note (Signed)
-  Continue Lipitor 40 mg daily ?

## 2022-06-26 NOTE — ED Provider Notes (Signed)
[facility] DEPT Provider Note   CSN: [id number] Arrival date & time: 06/26/22  1951     History  Chief Complaint  Patient presents with   Emesis    Kathy Lewis is a 26 y.o. female.   Emesis Patient [name] with nausea vomiting. History of peritoneal dialysis.  History of recurrent vomiting although no diarrhea.  Sugar was elevated more than 500 though.  States she is on insulin pump.  States she feels that she could be in DKA now.  Has had recurrent nausea and vomiting and thought of potentially cyclic vomiting but states she thinks this is more related to her sugar.  No known sick contacts.      Past Medical History:  Diagnosis Date   Anemia    Anxiety    Bipolar 2 disorder (HCC)    Chronic kidney disease    Chronic systolic (congestive) heart failure (HCC)    Depression    DKA (diabetic ketoacidoses)    ESRD on peritoneal dialysis ([place])    HSV infection    on valtrex   Hypokalemia    Leukocytosis    Migraine    Noncompliance with medication regimen    Preeclampsia    Severe anemia    Type 1 diabetes mellitus ([place])    Past Medical History:  Diagnosis Date   Anemia    Anxiety    Bipolar 2 disorder (HCC)    Chronic kidney disease    Chronic systolic (congestive) heart failure (HCC)    Depression    DKA (diabetic ketoacidoses)    ESRD on peritoneal dialysis ([place])    HSV infection    on valtrex   Hypokalemia    Leukocytosis    Migraine    Noncompliance with medication regimen    Preeclampsia    Severe anemia    Type 1 diabetes mellitus ([place])      Home Medications Prior to Admission medications   Medication Sig Start Date End Date Taking? Authorizing Provider  acetaminophen (TYLENOL) 500 MG tablet Take 1,000 mg by mouth every 6 (six) hours as needed for headache (pain).    [provider]  amitriptyline (ELAVIL) 10 MG tablet Take 1 tablet (10 mg total) by mouth at bedtime. 05/25/22   [name], MD   atorvastatin (LIPITOR) 40 MG tablet Take 40 mg by mouth in the morning and at bedtime.    [provider]  butalbital-acetaminophen-caffeine (FIORICET) 50-325-40 MG tablet Take 1 tablet by mouth daily as needed for headache.    [provider]  calcitRIOL (ROCALTROL) 0.25 MCG capsule Take 0.25 mcg by mouth every morning. 08/03/21   [provider]  calcium carbonate (TUMS - DOSED IN MG ELEMENTAL CALCIUM) 500 MG chewable tablet Take 2 tablets by mouth 3 (three) times daily with meals.    [provider]  carvedilol (COREG) 12.5 MG tablet Take 12.5 mg by mouth 2 (two) times daily with a meal.    [provider]  clonazePAM (KLONOPIN) 0.5 MG tablet Take 1 tablet (0.5 mg total) by mouth 2 (two) times daily as needed for anxiety. Patient taking differently: Take 0.5 mg by mouth 2 (two) times daily. 03/16/22   [name], MD  hydrALAZINE (APRESOLINE) 100 MG tablet Take 100 mg by mouth 3 (three) times daily. 08/05/21   [provider]  insulin lispro (HUMALOG) 100 UNIT/ML injection Inject into the skin continuous. Via Insulin Pump 01/08/22   [provider]  isosorbide mononitrate (IMDUR)  30 MG 24 hr tablet Take 30 mg by mouth in the morning and at bedtime.    [provider]  magnesium oxide (MAG-OX) 400 MG tablet Take 400 mg by mouth 2 (two) times daily. 08/03/21   [provider]  methocarbamol (ROBAXIN) 500 MG tablet Take 500 mg by mouth every 8 (eight) hours as needed for muscle spasms.    [provider]  metoCLOPramide (REGLAN) 10 MG tablet Take 1 tablet (10 mg total) by mouth every 8 (eight) hours as needed for nausea. 01/17/22   Lucrezia Starch, MD  ondansetron (ZOFRAN) 4 MG tablet Take 1 tablet (4 mg total) by mouth every 6 (six) hours. Patient taking differently: Take 4 mg by mouth every 6 (six) hours as needed for nausea or vomiting. 04/11/22   [name], PA  pantoprazole (PROTONIX) 40 MG  tablet Take 1 tablet (40 mg total) by mouth 2 (two) times daily before a meal. 04/11/22   Lemmon, Lavone Nian, PA  prochlorperazine (COMPAZINE) 5 MG tablet Take 1 tablet (5 mg total) by mouth every 6 (six) hours as needed for nausea or vomiting. 01/21/22   Regan Lemming, MD  promethazine (PHENERGAN) 25 MG suppository Place 1 suppository (25 mg total) rectally as needed for nausea or vomiting. 04/11/22   [name], PA  sacubitril-valsartan (ENTRESTO) 49-51 MG Take 1 tablet by mouth 2 (two) times daily.    [provider]  Vitamin D, Ergocalciferol, (DRISDOL) 1.25 MG (50000 UNIT) CAPS capsule Take 50,000 Units by mouth every Sunday.    [provider]  escitalopram (LEXAPRO) 10 MG tablet Take 20 mg by mouth daily.  05/10/20 10/06/20  [provider]  furosemide (LASIX) 40 MG tablet Take 1 tablet (40 mg total) by mouth daily. Patient not taking: No sig reported 10/28/19 02/17/21  [name], MD  lisinopril (ZESTRIL) 10 MG tablet Take 10 mg by mouth daily.  02/23/20 10/06/20  [provider]  spironolactone (ALDACTONE) 25 MG tablet Take 25 mg by mouth daily.  04/20/20 10/06/20  [provider]      Allergies    Cantaloupe extract allergy skin test, Strawberry extract, Citrullus vulgaris, and Nsaids    Review of Systems   Review of Systems  Gastrointestinal:  Positive for vomiting.    Physical Exam Updated Vital Signs BP (!) 156/111   Pulse (!) 104   Temp 97.8 F (36.6 C) (Axillary)   Resp 20   Ht 5' (1.524 m)   Wt 59 kg   SpO2 94%   BMI 25.39 kg/m  Physical Exam Vitals and nursing note reviewed.  Eyes:     Pupils: Pupils are equal, round, and reactive to light.  Cardiovascular:     Rate and Rhythm: Regular rhythm. Tachycardia present.  Pulmonary:     Breath sounds: No wheezing.  Abdominal:     Comments:  mild upper abdominal tenderness.  No rebound or guarding.  Musculoskeletal:        General: No tenderness.     Cervical  back: Neck supple.  Skin:    General: Skin is warm.     Capillary Refill: Capillary refill takes less than 2 seconds.  Neurological:     Mental Status: She is alert and oriented to person, place, and time.     ED Results / Procedures / Treatments   Labs (all labs ordered are listed, but only abnormal results are displayed) Labs Reviewed  BASIC METABOLIC PANEL - Abnormal; Notable for the following  components:      Result Value   Potassium 5.5 (*)    CO2 14 (*)    Glucose, Bld 597 (*)    BUN 91 (*)    Creatinine, Ser 12.04 (*)    Calcium 7.1 (*)    GFR, Estimated 4 (*)    Anion gap 23 (*)    All other components within normal limits  BETA-HYDROXYBUTYRIC ACID - Abnormal; Notable for the following components:   Beta-Hydroxybutyric Acid 2.91 (*)    All other components within normal limits  CBC WITH DIFFERENTIAL/PLATELET - Abnormal; Notable for the following components:   Lymphs Abs 0.3 (*)    All other components within normal limits  BLOOD GAS, VENOUS - Abnormal; Notable for the following components:   pCO2, Ven 36 (*)    pO2, Ven 56 (*)    Bicarbonate 16.5 (*)    Acid-base deficit 9.6 (*)    All other components within normal limits  CBG MONITORING, ED - Abnormal; Notable for the following components:   Glucose-Capillary 543 (*)    All other components within normal limits  I-STAT CHEM 8, ED - Abnormal; Notable for the following components:   Sodium 133 (*)    Potassium 5.4 (*)    BUN 81 (*)    Creatinine, Ser 13.50 (*)    Glucose, Bld 608 (*)    Calcium, Ion 0.82 (*)    TCO2 17 (*)    All other components within normal limits  HCG, QUANTITATIVE, PREGNANCY  BASIC METABOLIC PANEL  BASIC METABOLIC PANEL  BASIC METABOLIC PANEL  BETA-HYDROXYBUTYRIC ACID  URINALYSIS, ROUTINE W REFLEX MICROSCOPIC  CBG MONITORING, ED    EKG None  Radiology No results found.  Procedures Procedures    Medications Ordered in ED Medications  lactated ringers bolus 1,180 mL (has  no administration in time range)  insulin regular, human (MYXREDLIN) 100 units/ 100 mL infusion (has no administration in time range)  lactated ringers infusion (has no administration in time range)  dextrose 5 % in lactated ringers infusion (has no administration in time range)  dextrose 50 % solution 0-50 mL (has no administration in time range)  sodium chloride 0.9 % bolus 1,000 mL (1,000 mLs Intravenous New Bag/Given 06/26/22 2146)  metoCLOPramide (REGLAN) injection 10 mg (10 mg Intravenous Given 06/26/22 2145)    ED Course/ Medical Decision Making/ A&P                           Medical Decision Making Amount and/or Complexity of Data Reviewed Labs: ordered.  Risk Prescription drug management.   Patient with nausea and vomiting.  Hyperglycemia.  Feels that she could have DKA.  History of both DKA and recurrent vomiting.  Extensive work-up without clear cause of the pain.  Potential cyclic vomiting or other similar syndrome.  We will get basic blood work.  Will give fluids and antiemetics.  Blood work shows DKA.  Normal pH but has anion gap and low bicarb with high glucose.  Will require admission to hospital.  Has had another episode of vomiting.  Had been given Reglan initially.  Is on insulin drip.  Of note patient also is a peritoneal dialysis patient.        Final Clinical Impression(s) / ED Diagnoses Final diagnoses:  Diabetic ketoacidosis without coma associated with type 1 diabetes mellitus ([place])    Rx / DC Orders ED Discharge Orders     None  Davonna Belling, MD 06/26/22 2250

## 2022-06-26 NOTE — ED Provider Triage Note (Signed)
Emergency Medicine Provider Triage Evaluation Note  Corinthian Kemler , a 26 y.o. female  was evaluated in triage.  Pt complains of concerns for DKA. States she is on dialysis, but has had vomiting, nausea and abdominal pain all day. Checked her sugar and it was 500+ today. Took insulin before she left. Has been compliant with meds. Does not make any urine. Denies nay chest pain, confusion. Feels unwell and weak.   Review of Systems  Positive: Nausea, vomiting, abdominal pain Negative: Fever, chills  Physical Exam  BP (!) 167/120 (BP Location: Left Arm)   Pulse (!) 103   Temp (!) 97.5 F (36.4 C) (Oral)   Resp 20   SpO2 97%  Gen:   Awake, actively retching Resp:  Normal effort  MSK:   Moves extremities without difficulty  Medical Decision Making  Medically screening exam initiated at 8:35 PM.  Appropriate orders placed.  Raffaela Ladley was informed that the remainder of the evaluation will be completed by another provider, this initial triage assessment does not replace that evaluation, and the importance of remaining in the ED until their evaluation is complete.  Informed Charge RN/triage RN need for patient to be roomed ASAP.     Osvaldo Shipper, [place] 06/26/22 2038

## 2022-06-26 NOTE — Subjective & Objective (Signed)
Comes in from home nausea vomiting dizziness patient does peritoneal dialysis [id number] she is diabetic continues to take her insulin Ongoing nausea vomiting abdominal pain all day she does not make any urine no chest pain but feels overall unwell.  Continued to have nausea vomiting while in emergency department Patient also states she is on insulin pump feels like her previous DKA.  No sick contacts

## 2022-06-26 NOTE — H&P (Signed)
Kathy Lewis BHA:[id number] DOB: Sep 07, 1995 DOA: 06/26/2022     PCP: Patient, No Pcp Per   Outpatient Specialists: * NONE CARDS: * Dr. NEphrology: *  Dr. NEurology *   Dr. Pulmonary *  Dr.  Oncology * Dr. Fabienne Bruns* Dr.  Sadie Haber, [facility]) [facility]. *  Patient arrived to ER on 06/26/22 at 1951 Referred by Attending Davonna Belling, MD   Patient coming from:    home Lives alone,   *** With family    Chief Complaint:   Chief Complaint  Patient presents with   Emesis    HPI: Kathy Lewis is a 26 y.o. female with medical history significant of DM type I, bipolar, end-stage renal disease on peritoneal dialysis, anemia, recurrent DKA, HLD    Presented with   nausea vomiting Comes in from home nausea vomiting dizziness patient does peritoneal dialysis CBG 578 she is diabetic continues to take her insulin Ongoing nausea vomiting abdominal pain all day she does not make any urine no chest pain but feels overall unwell.  Continued to have nausea vomiting while in emergency department Patient also states she is on insulin pump feels like her previous DKA.  No sick contacts        Regarding pertinent Chronic problems:     Hyperlipidemia - *on statins Lipitor (atorvastatin)  Lipid Panel  No results found for: "CHOL", "TRIG", "HDL", "CHOLHDL", "VLDL", "LDLCALC", "LDLDIRECT", "LABVLDL"   HTN on Imdur and hydralazine   chronic CHF diastolic/systolic/ combined - last echo March 2023 EF 20% global hypokinesis ventricular diastolic parameters intermediate  On Entresto and Lasix spironolactone       DM 1 -  Lab Results  Component Value Date   HGBA1C 9.2 (H) 11/23/2021   on insulin pump      *** Asthma -well *** controlled on home inhalers/ nebs f                        ***last no prior***admission  ***                       No ***history of intubation  *** COPD - not **followed by pulmonology *** not  on baseline oxygen  *L,    *** OSA -on nocturnal oxygen,  *CPAP, *noncompliant with CPAP  *** Hx of CVA - *with/out residual deficits on Aspirin 81 mg, 325, Plavix  End-stage renal disease on hemodialysis Lab Results  Component Value Date   CREATININE 13.50 (H) 06/26/2022   CREATININE 12.04 (H) 06/26/2022   CREATININE 12.39 (H) 04/26/2022    While in ER:   Noted to have DKA   CXR - ***NON acute  CTabd/pelvis - ***nonacute  CTA chest - ***nonacute, no PE, * no evidence of infiltrate  Following Medications were ordered in ER: Medications  insulin regular, human (MYXREDLIN) 100 units/ 100 mL infusion (has no administration in time range)  lactated ringers infusion (has no administration in time range)  dextrose 5 % in lactated ringers infusion (has no administration in time range)  dextrose 50 % solution 0-50 mL (has no administration in time range)  sodium chloride 0.9 % bolus 1,000 mL (1,000 mLs Intravenous New Bag/Given 06/26/22 2146)  metoCLOPramide (REGLAN) injection 10 mg (10 mg Intravenous Given 06/26/22 2145)  lactated ringers bolus 1,180 mL (1,180 mLs Intravenous New Bag/Given 06/26/22 2306)    _______________________________________________________ ER Provider Called:  Nephrology   DrMarland Kitchen  They Recommend admit to medicine ***  Will see in AM  ***SEEN in ER   ED Triage Vitals  Enc Vitals Group     BP 06/26/22 2033 (!) 167/120     Pulse Rate 06/26/22 2033 (!) 103     Resp 06/26/22 2033 20     Temp 06/26/22 2033 (!) 97.5 F (36.4 C)     Temp Source 06/26/22 2033 Oral     SpO2 06/26/22 2033 97 %     Weight 06/26/22 2053 130 lb (59 kg)     Height 06/26/22 2053 5' (1.524 m)     Head Circumference --      Peak Flow --      Pain Score 06/26/22 2053 0     Pain Loc --      Pain Edu? --      Excl. in [place]? --   TMAX(24)@     _________________________________________ Significant initial  Findings: Abnormal Labs Reviewed  BASIC METABOLIC PANEL - Abnormal; Notable for the following components:      Result Value   Potassium 5.5  (*)    CO2 14 (*)    Glucose, Bld 597 (*)    BUN 91 (*)    Creatinine, Ser 12.04 (*)    Calcium 7.1 (*)    GFR, Estimated 4 (*)    Anion gap 23 (*)    All other components within normal limits  BETA-HYDROXYBUTYRIC ACID - Abnormal; Notable for the following components:   Beta-Hydroxybutyric Acid 2.91 (*)    All other components within normal limits  CBC WITH DIFFERENTIAL/PLATELET - Abnormal; Notable for the following components:   Lymphs Abs 0.3 (*)    All other components within normal limits  BLOOD GAS, VENOUS - Abnormal; Notable for the following components:   pCO2, Ven 36 (*)    pO2, Ven 56 (*)    Bicarbonate 16.5 (*)    Acid-base deficit 9.6 (*)    All other components within normal limits  CBG MONITORING, ED - Abnormal; Notable for the following components:   Glucose-Capillary 543 (*)    All other components within normal limits  I-STAT CHEM 8, ED - Abnormal; Notable for the following components:   Sodium 133 (*)    Potassium 5.4 (*)    BUN 81 (*)    Creatinine, Ser 13.50 (*)    Glucose, Bld 608 (*)    Calcium, Ion 0.82 (*)    TCO2 17 (*)    All other components within normal limits  CBG MONITORING, ED - Abnormal; Notable for the following components:   Glucose-Capillary 543 (*)    All other components within normal limits    _________________________ Troponin ***ordered ECG: Ordered Personally reviewed and interpreted by me showing: HR : *** Rhythm: *NSR, Sinus tachycardia * A.fib. W RVR, RBBB, LBBB, Paced Ischemic changes*nonspecific changes, no evidence of ischemic changes QTC*   The recent clinical data is shown below. Vitals:   06/26/22 2106 06/26/22 2130 06/26/22 2200 06/26/22 2230  BP:  (!) 151/109 (!) 151/104 (!) 156/111  Pulse: (!) 102 (!) 106 (!) 105 (!) 104  Resp:  15 20 20   Temp:      TempSrc:      SpO2: 94% 94% 96% 94%  Weight:      Height:         WBC     Component Value Date/Time   WBC 6.0 06/26/2022 2054   LYMPHSABS 0.3 (L)  06/26/2022 2054   MONOABS 0.1 06/26/2022 2054   EOSABS 0.0 06/26/2022  2054   BASOSABS 0.0 06/26/2022 2054      Results for orders placed or performed during the hospital encounter of 03/15/22  Urine Culture     Status: Abnormal   Collection Time: 03/15/22  6:54 PM   Specimen: Urine, Clean Catch  Result Value Ref Range Status   Specimen Description   Final    URINE, CLEAN CATCH Performed at [facility], [address] [place], [place] [address]    Special Requests   Final    NONE Performed at [facility], [address] [place], [place] [address]    Culture MULTIPLE SPECIES PRESENT, SUGGEST RECOLLECTION (A)  Final   Report Status 03/16/2022 FINAL  Final     _______________________________________________ Hospitalist was called for admission for   Diabetic ketoacidosis without coma associated with type 1 diabetes mellitus ([place])    The following Work up has been ordered so far:  Orders Placed This Encounter  Procedures   Basic metabolic panel   Beta-hydroxybutyric acid   CBC with Differential (PNL)   Blood gas, venous   hCG, quantitative, pregnancy   Urinalysis, Routine w reflex microscopic   Diet NPO time specified   Cardiac monitoring   Initiate Carrier Fluid Protocol   Notify physician (specify)   If present, discontinue Insulin Pump after IV Insulin is initiated.   Do NOT use lab glucose values in EndoTool.  If CBG meter reads "Critical High", enter 600.   IV insulin infusion with sufficient glucose should be continued until MD determines acidosis is corrected and places transition orders.   Upon IV fluid bolus completion, place order for STAT BMET (LAB15) and call provider with results.   Notify physician (specify)   If present, discontinue Insulin Pump after IV Insulin is initiated.   Do NOT use lab glucose values in EndoTool.  If CBG meter reads "Critical High", enter 600.   IV insulin infusion with sufficient glucose should be continued  until MD determines acidosis is corrected and places transition orders.   Upon IV fluid bolus completion, place order for STAT BMET (LAB15) and call provider with results.   K+ > 5 mEq/L and/or K+ addressed separately   Consult to hospitalist   Pulse oximetry, continuous   CBG monitoring, ED   CBG monitoring, ED   I-stat chem 8, ED (not at [facility] or [facility])   CBG monitoring, ED   CBG monitoring, ED   ED EKG   Insert peripheral IV   Insert peripheral IV     OTHER Significant initial  Findings:  labs showing:    Recent Labs  Lab 06/26/22 2054 06/26/22 2102  NA 135 133*  K 5.5* 5.4*  CO2 14*  --   GLUCOSE 597* 608*  BUN 91* 81*  CREATININE 12.04* 13.50*  CALCIUM 7.1*  --     Cr  stable,   Lab Results  Component Value Date   CREATININE 13.50 (H) 06/26/2022   CREATININE 12.04 (H) 06/26/2022   CREATININE 12.39 (H) 04/26/2022    No results for input(s): "AST", "ALT", "ALKPHOS", "BILITOT", "PROT", "ALBUMIN" in the last 168 hours. Lab Results  Component Value Date   CALCIUM 7.1 (L) 06/26/2022   PHOS 6.4 (H) 01/24/2022          Plt: Lab Results  Component Value Date   PLT 221 06/26/2022           Arterial ***Venous  Blood Gas result:  pH *** pCO2 ***; pO2 ***;     %  O2 Sat ***.  ABG    Component Value Date/Time   PHART 7.410 06/17/2019 1040   PCO2ART 38.3 06/17/2019 1040   PO2ART 96.5 06/17/2019 1040   HCO3 16.5 (L) 06/26/2022 2054   TCO2 17 (L) 06/26/2022 2102   ACIDBASEDEF 9.6 (H) 06/26/2022 2054   O2SAT 80.3 06/26/2022 2054         Recent Labs  Lab 06/26/22 2054 06/26/22 2102  WBC 6.0  --   NEUTROABS 5.5  --   HGB 12.1 13.3  HCT 37.3 39.0  MCV 96.1  --   PLT 221  --     HG/HCT * stable,  Down *Up from baseline see below    Component Value Date/Time   HGB 13.3 06/26/2022 2102   HCT 39.0 06/26/2022 2102   HCT 23.0 (L) 10/18/2019 0534   MCV 96.1 06/26/2022 2054      No results for input(s): "LIPASE", "AMYLASE" in the last 168  hours. No results for input(s): "AMMONIA" in the last 168 hours.    Cardiac Panel (last 3 results) No results for input(s): "CKTOTAL", "CKMB", "TROPONINI", "RELINDX" in the last 72 hours.  .car BNP (last 3 results) Recent Labs    08/11/21 1143 11/22/21 1027  BNP 1,278.5* 2,727.2*      DM  labs:  HbA1C: Recent Labs    11/23/21 1828  HGBA1C 9.2*       CBG (last 3)  Recent Labs    06/26/22 2032 06/26/22 2303  GLUCAP 543* 543*          Cultures:    Component Value Date/Time   SDES  03/15/2022 1854    URINE, CLEAN CATCH Performed at [facility], [address] [place]el[place] Bhat [place], [place] [address]    [facility]  03/15/2022 1854    NONE Performed at [facility], [address] [place],  [address]    [place] (A) 03/15/2022 1854   REPTSTATUS 03/16/2022 FINAL 03/15/2022 1854     Radiological Exams on Admission: No results found. _______________________________________________________________________________________________________ Latest  Blood pressure (!) 156/111, pulse (!) 104, temperature 97.8 F (36.6 C), temperature source Axillary, resp. rate 20, height 5' (1.524 m), weight 59 kg, SpO2 94 %.   Vitals  labs and radiology finding personally reviewed  Review of Systems:    Pertinent positives include: ***  Constitutional:  No weight loss, night sweats, Fevers, chills, fatigue, weight loss  HEENT:  No headaches, Difficulty swallowing,Tooth/dental problems,Sore throat,  No sneezing, itching, ear ache, nasal congestion, post nasal drip,  Cardio-vascular:  No chest pain, Orthopnea, PND, anasarca, dizziness, palpitations.no Bilateral lower extremity swelling  GI:  No heartburn, indigestion, abdominal pain, nausea, vomiting, diarrhea, change in bowel habits, loss of appetite, melena, blood in stool, hematemesis Resp:  no shortness of breath at rest. No dyspnea on exertion, No excess mucus,  no productive cough, No non-productive cough, No coughing up of blood.No change in color of mucus.No wheezing. Skin:  no rash or lesions. No jaundice GU:  no dysuria, change in color of urine, no urgency or frequency. No straining to urinate.  No flank pain.  Musculoskeletal:  No joint pain or no joint swelling. No decreased range of motion. No back pain.  Psych:  No change in mood or affect. No depression or anxiety. No memory loss.  Neuro: no localizing neurological complaints, no tingling, no weakness, no double vision, no gait abnormality, no slurred speech, no confusion  All systems reviewed and  apart from [place] all are negative _______________________________________________________________________________________________ Past Medical History:   Past Medical History:  Diagnosis Date   Anemia    Anxiety    Bipolar 2 disorder ([place])    Chronic kidney disease    Chronic systolic (congestive) heart failure (HCC)    Depression    DKA (diabetic ketoacidoses)    ESRD on peritoneal dialysis ([place])    HSV infection    on valtrex   Hypokalemia    Leukocytosis    Migraine    Noncompliance with medication regimen    Preeclampsia    Severe anemia    Type 1 diabetes mellitus ([place])       Past Surgical History:  Procedure Laterality Date   BIOPSY  04/24/2022   Procedure: BIOPSY;  Surgeon: [name], MD;  Location: [facility];  Service: Gastroenterology;;   CARDIAC CATHETERIZATION     COLONOSCOPY WITH PROPOFOL N/A 04/24/2022   Procedure: COLONOSCOPY WITH PROPOFOL;  Surgeon: [name], MD;  Location: [facility] ENDOSCOPY;  Service: Gastroenterology;  Laterality: N/A;   DILATION AND EVACUATION N/A 10/22/2019   Procedure: ULTRASOUND GUIDED DILATATION AND EVACUATION;  Surgeon: [name], MD;  Location: [facility];  Service: Gynecology;  Laterality: N/A;   ESOPHAGOGASTRODUODENOSCOPY (EGD) WITH PROPOFOL N/A 04/24/2022   Procedure: ESOPHAGOGASTRODUODENOSCOPY (EGD) WITH  PROPOFOL;  Surgeon: [name], MD;  Location: [facility];  Service: Gastroenterology;  Laterality: N/A;   NO PAST SURGERIES     peritoneal dialysis catheter insertion     RENAL BIOPSY      Social History:  Ambulatory *** independently cane, walker  wheelchair bound, bed bound     reports that she has quit smoking. Her smoking use included cigars and cigarettes. She has a 2.00 pack-year smoking history. She has never used smokeless tobacco. She reports that she does not drink alcohol and does not use drugs.     Family History: *** Family History  Adopted: Yes  Problem Relation Age of Onset   Heart disease Neg Hx    ______________________________________________________________________________________________ Allergies: Allergies  Allergen Reactions   Cantaloupe Extract Allergy Skin Test Itching    Mouth itching     Strawberry Extract Itching    Mouth itches   Citrullus Vulgaris Itching    Makes mouth itch , ALL melons    Nsaids Other (See Comments)    Avoid per nephrology     Prior to Admission medications   Medication Sig Start Date End Date Taking? Authorizing Provider  acetaminophen (TYLENOL) 500 MG tablet Take 1,000 mg by mouth every 6 (six) hours as needed for headache (pain).    [provider]  amitriptyline (ELAVIL) 10 MG tablet Take 1 tablet (10 mg total) by mouth at bedtime. 05/25/22   [name], MD  atorvastatin (LIPITOR) 40 MG tablet Take 40 mg by mouth in the morning and at bedtime.    [provider]  butalbital-acetaminophen-caffeine (FIORICET) 50-325-40 MG tablet Take 1 tablet by mouth daily as needed for headache.    [provider]  calcitRIOL (ROCALTROL) 0.25 MCG capsule Take 0.25 mcg by mouth every morning. 08/03/21   [provider]  calcium carbonate (TUMS - DOSED IN MG ELEMENTAL CALCIUM) 500 MG chewable tablet Take 2 tablets by mouth 3 (three) times daily with meals.    [provider]  carvedilol (COREG) 12.5 MG tablet Take 12.5 mg by mouth 2 (two) times daily with a meal.    [provider]  clonazePAM (KLONOPIN) 0.5  MG tablet Take 1 tablet (0.5 mg total) by mouth 2 (two) times daily as needed for anxiety. Patient taking differently: Take 0.5 mg by mouth 2 (two) times daily. 03/16/22   [name], MD  hydrALAZINE (APRESOLINE) 100 MG tablet Take 100 mg by mouth 3 (three) times daily. 08/05/21   [provider]  insulin lispro (HUMALOG) 100 UNIT/ML injection Inject into the skin continuous. Via Insulin Pump 01/08/22   [provider]  isosorbide mononitrate (IMDUR) 30 MG 24 hr tablet Take 30 mg by mouth in the morning and at bedtime.    [provider]  magnesium oxide (MAG-OX) 400 MG tablet Take 400 mg by mouth 2 (two) times daily. 08/03/21   [provider]  methocarbamol (ROBAXIN) 500 MG tablet Take 500 mg by mouth every 8 (eight) hours as needed for muscle spasms.    [provider]  metoCLOPramide (REGLAN) 10 MG tablet Take 1 tablet (10 mg total) by mouth every 8 (eight) hours as needed for nausea. 01/17/22   Lucrezia Starch, MD  ondansetron (ZOFRAN) 4 MG tablet Take 1 tablet (4 mg total) by mouth every 6 (six) hours. Patient taking differently: Take 4 mg by mouth every 6 (six) hours as needed for nausea or vomiting. 04/11/22   [name], PA  pantoprazole (PROTONIX) 40 MG tablet Take 1 tablet (40 mg total) by mouth 2 (two) times daily before a meal. 04/11/22   Lemmon, Lavone Nian, PA  prochlorperazine (COMPAZINE) 5 MG tablet Take 1 tablet (5 mg total) by mouth every 6 (six) hours as needed for nausea or vomiting. 01/21/22   Regan Lemming, MD  promethazine (PHENERGAN) 25 MG suppository Place 1 suppository (25 mg total) rectally as needed for nausea or vomiting. 04/11/22   [name], PA  sacubitril-valsartan (ENTRESTO) 49-51 MG Take 1 tablet by mouth 2 (two) times daily.    [provider]  Vitamin D, Ergocalciferol, (DRISDOL) 1.25 MG (50000 UNIT) CAPS capsule Take 50,000 Units by mouth every Sunday.    [provider]  escitalopram (LEXAPRO) 10 MG tablet Take 20 mg by mouth daily.  05/10/20 10/06/20  [provider]  furosemide (LASIX) 40 MG tablet Take 1 tablet (40 mg total) by mouth daily. Patient not taking: No sig reported 10/28/19 02/17/21  [name], MD  lisinopril (ZESTRIL) 10 MG tablet Take 10 mg by mouth daily.  02/23/20 10/06/20  [provider]  spironolactone (ALDACTONE) 25 MG tablet Take 25 mg by mouth daily.  04/20/20 10/06/20  [provider]    ___________________________________________________________________________________________________ Physical Exam:    06/26/2022   10:30 PM 06/26/2022   10:00 PM 06/26/2022    9:30 PM  Vitals with BMI  Systolic 263 335 456  Diastolic 256 389 373  Pulse 104 105 106    1. General:  in No ***Acute distress***increased work of breathing ***complaining of severe pain****agitated * Chronically ill *well *cachectic *toxic acutely ill -appearing 2. Psychological: Alert and *** Oriented 3. Head/ENT:   Moist *** Dry Mucous Membranes                          Head Non traumatic, neck supple                          Normal *** Poor Dentition 4. SKIN: normal *** decreased Skin turgor,  Skin clean Dry and intact no rash 5. Heart: Regular  rate and rhythm no*** Murmur, no Rub or gallop 6. Lungs: ***Clear to auscultation bilaterally, no wheezes or crackles   7. Abdomen: Soft, ***non-tender, Non distended *** obese ***bowel sounds present 8. Lower extremities: no clubbing, cyanosis, no ***edema 9. Neurologically Grossly intact, moving all 4 extremities equally *** strength 5 out of 5 in all 4 extremities cranial nerves II through XII intact 10. MSK: Normal range of motion    Chart has been  reviewed  ______________________________________________________________________________________________  Assessment/Plan 26 y.o. female with medical history significant of DM type I, bipolar, end-stage renal disease on peritoneal dialysis, anemia, recurrent DKA, HLD   Admitted for   Diabetic ketoacidosis without coma associated with type 1 diabetes mellitus ([place])     Present on Admission:  DKA, type 1 (HCC)  Nausea & vomiting  Chronic systolic CHF (congestive heart failure) (HCC)  Prolonged QT interval  Essential hypertension  ESRD (end stage renal disease) ([place]) - on peritoneal dialysis  Normocytic anemia  Dyslipidemia     Nausea & vomiting Chronic recurrent will need supportive management  Chronic systolic CHF (congestive heart failure) (HCC) Fluid status need to be managed carefully given DKA but also EF of 20% would appreciate cardiology consult to help with management patient does not make any urine and will need peritoneal dialysis consulted nephrology  Prolonged QT interval - will monitor on tele avoid QT prolonging medications, rehydrate correct electrolytes   Essential hypertension Allow permissive hypertension  ESRD (end stage renal disease) ([place]) - on peritoneal dialysis Patient on continuous peritoneal dialysis. If abdominal pain does not improve would benefit from dialysis nurse obtaining sample from the fluid And sent for culture At this point no evidence of infectious process. Consulting nephrology to have patient undergo peritoneal dialysis while here to avoid fluid overload Patient received aggressive fluids in the emergency department She does not make any urine  DKA, type 1 ([place]) will admit per DKA/ l, obtain serial BMET, start on glucosestabalizer,   Gentle IVF.   Change IVF to D5 1/2Na after BG <250 .    Monitor in [place]. Replace potassium as needed.    DC insuline pump while on the drip.  Consult diabetes coordinator     Normocytic  anemia Chronic stable continue to monitor CBC  Dyslipidemia Continue Lipitor 40 mg daily    Other plan as per orders.  DVT prophylaxis:  SCD *** Lovenox       Code Status:    Code Status: Prior FULL CODE *** DNR/DNI ***comfort care as per patient ***family  I had personally discussed CODE STATUS with patient and family* I had spent *min discussing goals of care and CODE STATUS    Family Communication:   Family not at  Bedside  plan of care was discussed on the phone with *** Son, Daughter, Wife, Husband, Sister, Brother , father, mother  Disposition Plan:   *** likely will need placement for rehabilitation                          Back to current facility when stable                            To home once workup is complete and patient is stable  ***Following barriers for discharge:  Electrolytes corrected                               Anemia corrected                             Pain controlled with PO medications                               Afebrile, white count improving able to transition to PO antibiotics                             Will need to be able to tolerate PO                            Will likely need home health, home O2, set up                           Will need consultants to evaluate patient prior to discharge  ****EXPECT DC tomorrow                    ***Would benefit from PT/OT eval prior to DC  Ordered                   Swallow eval - SLP ordered                   Diabetes care coordinator                   Transition of care consulted                   Nutrition    consulted                  Wound care  consulted                   Palliative care    consulted                   Behavioral health  consulted                    Consults called: nephrolgy is aware, email cardiology   Admission status:  ED Disposition     ED Disposition  [place]: [place]  [[id number]]  Level of Care: Progressive [102]  Admit to Progressive based on following criteria: CARDIOVASCULAR & THORACIC of moderate stability with acute coronary syndrome symptoms/low risk myocardial infarction/hypertensive urgency/arrhythmias/heart failure potentially compromising stability and stable post cardiovascular intervention patients.  Admit to Progressive based on following criteria: NEPHROLOGY stable condition requiring close monitoring for AKI, requiring Hemodialysis or Peritoneal Dialysis either from expected electrolyte imbalance, acidosis, or fluid overload that can be managed by NIPPV or high flow oxygen.  May place patient in observation at [facility] or [place] if equivalent level of care is available:: No  Covid Evaluation: Asymptomatic - no recent exposure (last 10 days) testing not required  Diagnosis: DKA, type 1 [facility]) [[id number]]  Admitting Physician: Toy Baker [[id number]]  Attending Physician: Toy Baker [[id number]]  Obs    Level of care      progressive tele indefinitely please discontinue once patient no longer qualifies COVID-19 Labs    Gearald Stonebraker 06/26/2022, 11:08 PM ***  [facility]     after 2 AM please page floor coverage PA If 7AM-7PM, please contact the day team taking care of the patient using [email or web address]   Patient was evaluated in the context of the global COVID-19 pandemic, which necessitated consideration that the patient might be at risk for infection with the SARS-CoV-2 virus that causes COVID-19. Institutional protocols and algorithms that pertain to the evaluation of patients at risk for COVID-19 are in a state of rapid change based on information released by regulatory bodies including the CDC and federal and state organizations. These policies and algorithms were followed during the patient's care.

## 2022-06-26 NOTE — Assessment & Plan Note (Signed)
will admit per DKA/ l, obtain serial BMET, start on glucosestabalizer,   Gentle IVF.   Change IVF to D5 1/2Na after BG <250 .    Monitor in [place]. Replace potassium as needed.    DC insuline pump while on the drip.  Consult diabetes coordinator

## 2022-06-26 NOTE — Assessment & Plan Note (Signed)
Patient on continuous peritoneal dialysis. If abdominal pain does not improve would benefit from dialysis nurse obtaining sample from the fluid And sent for culture At this point no evidence of infectious process. Consulting nephrology to have patient undergo peritoneal dialysis while here to avoid fluid overload Patient received aggressive fluids in the emergency department She does not make any urine

## 2022-06-26 NOTE — Assessment & Plan Note (Signed)
Chronic recurrent will need supportive management

## 2022-06-27 ENCOUNTER — Emergency Department (HOSPITAL_COMMUNITY): Payer: Medicaid Other

## 2022-06-27 ENCOUNTER — Observation Stay (HOSPITAL_COMMUNITY)
Admission: EM | Admit: 2022-06-27 | Discharge: 2022-06-28 | Disposition: A | Discharge status: Home / Self Care | Payer: Medicaid Other | Source: Home / Self Care | Attending: Emergency Medicine | Admitting: Emergency Medicine

## 2022-06-27 ENCOUNTER — Encounter (HOSPITAL_COMMUNITY): Payer: Self-pay

## 2022-06-27 DIAGNOSIS — E875 Hyperkalemia: Secondary | ICD-10-CM

## 2022-06-27 DIAGNOSIS — N186 End stage renal disease: Secondary | ICD-10-CM

## 2022-06-27 DIAGNOSIS — R112 Nausea with vomiting, unspecified: Secondary | ICD-10-CM | POA: Diagnosis present

## 2022-06-27 DIAGNOSIS — I5022 Chronic systolic (congestive) heart failure: Secondary | ICD-10-CM | POA: Insufficient documentation

## 2022-06-27 DIAGNOSIS — I160 Hypertensive urgency: Secondary | ICD-10-CM

## 2022-06-27 DIAGNOSIS — Z87891 Personal history of nicotine dependence: Secondary | ICD-10-CM | POA: Insufficient documentation

## 2022-06-27 DIAGNOSIS — E785 Hyperlipidemia, unspecified: Secondary | ICD-10-CM | POA: Diagnosis present

## 2022-06-27 DIAGNOSIS — I4581 Long QT syndrome: Secondary | ICD-10-CM | POA: Diagnosis not present

## 2022-06-27 DIAGNOSIS — E0810 Diabetes mellitus due to underlying condition with ketoacidosis without coma: Principal | ICD-10-CM

## 2022-06-27 DIAGNOSIS — E1010 Type 1 diabetes mellitus with ketoacidosis without coma: Secondary | ICD-10-CM | POA: Insufficient documentation

## 2022-06-27 DIAGNOSIS — Z794 Long term (current) use of insulin: Secondary | ICD-10-CM | POA: Insufficient documentation

## 2022-06-27 DIAGNOSIS — Z79899 Other long term (current) drug therapy: Secondary | ICD-10-CM | POA: Insufficient documentation

## 2022-06-27 DIAGNOSIS — Z992 Dependence on renal dialysis: Secondary | ICD-10-CM | POA: Insufficient documentation

## 2022-06-27 DIAGNOSIS — I150 Renovascular hypertension: Secondary | ICD-10-CM

## 2022-06-27 DIAGNOSIS — E1122 Type 2 diabetes mellitus with diabetic chronic kidney disease: Secondary | ICD-10-CM | POA: Insufficient documentation

## 2022-06-27 DIAGNOSIS — I132 Hypertensive heart and chronic kidney disease with heart failure and with stage 5 chronic kidney disease, or end stage renal disease: Secondary | ICD-10-CM | POA: Insufficient documentation

## 2022-06-27 DIAGNOSIS — E109 Type 1 diabetes mellitus without complications: Secondary | ICD-10-CM | POA: Diagnosis present

## 2022-06-27 DIAGNOSIS — E1110 Type 2 diabetes mellitus with ketoacidosis without coma: Secondary | ICD-10-CM | POA: Diagnosis present

## 2022-06-27 DIAGNOSIS — F3181 Bipolar II disorder: Secondary | ICD-10-CM | POA: Diagnosis present

## 2022-06-27 LAB — CBC WITH DIFFERENTIAL/PLATELET
Abs Immature Granulocytes: 0.03 10*3/uL (ref 0.00–0.07)
Basophils Absolute: 0 10*3/uL (ref 0.0–0.1)
Basophils Relative: 0 %
Eosinophils Absolute: 0 10*3/uL (ref 0.0–0.5)
Eosinophils Relative: 0 %
HCT: 36.9 % (ref 36.0–46.0)
Hemoglobin: 12.2 g/dL (ref 12.0–15.0)
Immature Granulocytes: 0 %
Lymphocytes Relative: 8 %
Lymphs Abs: 0.7 10*3/uL (ref 0.7–4.0)
MCH: 31 pg (ref 26.0–34.0)
MCHC: 33.1 g/dL (ref 30.0–36.0)
MCV: 93.9 fL (ref 80.0–100.0)
Monocytes Absolute: 0.3 10*3/uL (ref 0.1–1.0)
Monocytes Relative: 3 %
Neutro Abs: 8.2 10*3/uL — ABNORMAL HIGH (ref 1.7–7.7)
Neutrophils Relative %: 89 %
Platelets: 233 10*3/uL (ref 150–400)
RBC: 3.93 MIL/uL (ref 3.87–5.11)
RDW: 14.6 % (ref 11.5–15.5)
WBC: 9.2 10*3/uL (ref 4.0–10.5)
nRBC: 0 % (ref 0.0–0.2)

## 2022-06-27 LAB — CBG MONITORING, ED
Glucose-Capillary: 180 mg/dL — ABNORMAL HIGH (ref 70–99)
Glucose-Capillary: 322 mg/dL — ABNORMAL HIGH (ref 70–99)
Glucose-Capillary: 388 mg/dL — ABNORMAL HIGH (ref 70–99)
Glucose-Capillary: 413 mg/dL — ABNORMAL HIGH (ref 70–99)
Glucose-Capillary: 438 mg/dL — ABNORMAL HIGH (ref 70–99)
Glucose-Capillary: 450 mg/dL — ABNORMAL HIGH (ref 70–99)
Glucose-Capillary: 540 mg/dL (ref 70–99)
Glucose-Capillary: 545 mg/dL (ref 70–99)

## 2022-06-27 LAB — BLOOD GAS, VENOUS
Acid-base deficit: 10.2 mmol/L — ABNORMAL HIGH (ref 0.0–2.0)
Bicarbonate: 15.5 mmol/L — ABNORMAL LOW (ref 20.0–28.0)
O2 Saturation: 89.8 %
Patient temperature: 37
pCO2, Ven: 33 mmHg — ABNORMAL LOW (ref 44–60)
pH, Ven: 7.28 (ref 7.25–7.43)
pO2, Ven: 68 mmHg — ABNORMAL HIGH (ref 32–45)

## 2022-06-27 LAB — COMPREHENSIVE METABOLIC PANEL
ALT: 18 U/L (ref 0–44)
AST: 22 U/L (ref 15–41)
Albumin: 3.2 g/dL — ABNORMAL LOW (ref 3.5–5.0)
Alkaline Phosphatase: 75 U/L (ref 38–126)
Anion gap: 19 — ABNORMAL HIGH (ref 5–15)
BUN: 99 mg/dL — ABNORMAL HIGH (ref 6–20)
CO2: 15 mmol/L — ABNORMAL LOW (ref 22–32)
Calcium: 7.3 mg/dL — ABNORMAL LOW (ref 8.9–10.3)
Chloride: 101 mmol/L (ref 98–111)
Creatinine, Ser: 12.84 mg/dL — ABNORMAL HIGH (ref 0.44–1.00)
GFR, Estimated: 4 mL/min — ABNORMAL LOW (ref >60)
Glucose, Bld: 464 mg/dL — ABNORMAL HIGH (ref 70–99)
Potassium: 5.8 mmol/L — ABNORMAL HIGH (ref 3.5–5.1)
Sodium: 135 mmol/L (ref 135–145)
Total Bilirubin: 0.9 mg/dL (ref 0.3–1.2)
Total Protein: 6.5 g/dL (ref 6.5–8.1)

## 2022-06-27 LAB — TROPONIN I (HIGH SENSITIVITY)
Troponin I (High Sensitivity): 186 ng/L (ref <18)
Troponin I (High Sensitivity): 180 ng/L (ref <18)

## 2022-06-27 LAB — MAGNESIUM: Magnesium: 2.4 mg/dL (ref 1.7–2.4)

## 2022-06-27 LAB — I-STAT CHEM 8, ED
BUN: 101 mg/dL — ABNORMAL HIGH (ref 6–20)
Calcium, Ion: 0.88 mmol/L — CL (ref 1.15–1.40)
Chloride: 101 mmol/L (ref 98–111)
Creatinine, Ser: 14.2 mg/dL — ABNORMAL HIGH (ref 0.44–1.00)
Glucose, Bld: 470 mg/dL — ABNORMAL HIGH (ref 70–99)
HCT: 39 % (ref 36.0–46.0)
Hemoglobin: 13.3 g/dL (ref 12.0–15.0)
Potassium: 5.7 mmol/L — ABNORMAL HIGH (ref 3.5–5.1)
Sodium: 133 mmol/L — ABNORMAL LOW (ref 135–145)
TCO2: 17 mmol/L — ABNORMAL LOW (ref 22–32)

## 2022-06-27 LAB — BETA-HYDROXYBUTYRIC ACID: Beta-Hydroxybutyric Acid: 1.12 mmol/L — ABNORMAL HIGH (ref 0.05–0.27)

## 2022-06-27 LAB — BRAIN NATRIURETIC PEPTIDE: B Natriuretic Peptide: >4500 pg/mL — ABNORMAL HIGH (ref 0.0–100.0)

## 2022-06-27 MED ORDER — DEXTROSE IN LACTATED RINGERS 5 % IV SOLN: INTRAVENOUS | Status: DC

## 2022-06-27 MED ORDER — LACTATED RINGERS IV SOLN: INTRAVENOUS | Status: DC

## 2022-06-27 MED ORDER — ACETAMINOPHEN 325 MG PO TABS: 650.0000 mg | ORAL_TABLET | Freq: Four times a day (QID) | ORAL | Status: DC | PRN

## 2022-06-27 MED ORDER — ATORVASTATIN CALCIUM 40 MG PO TABS
40.0000 mg | ORAL_TABLET | Freq: Every day | ORAL | Status: DC
  Administered 2022-06-28: 40 mg via ORAL
  Filled 2022-06-27: qty 1

## 2022-06-27 MED ORDER — DELFLEX-LC/1.5% DEXTROSE 344 MOSM/L IP SOLN: INTRAPERITONEAL | Status: DC

## 2022-06-27 MED ORDER — SACUBITRIL-VALSARTAN 97-103 MG PO TABS: 2.0000 | ORAL_TABLET | Freq: Every day | ORAL | Status: DC

## 2022-06-27 MED ORDER — INSULIN REGULAR(HUMAN) IN NACL 100-0.9 UT/100ML-% IV SOLN
INTRAVENOUS | Status: DC
  Administered 2022-06-27: 5.5 [IU]/h via INTRAVENOUS
  Filled 2022-06-27: qty 100

## 2022-06-27 MED ORDER — VITAMIN D (ERGOCALCIFEROL) 1.25 MG (50000 UNIT) PO CAPS: 50000.0000 [IU] | ORAL_CAPSULE | ORAL | Status: DC

## 2022-06-27 MED ORDER — BUTALBITAL-APAP-CAFFEINE 50-325-40 MG PO TABS: 1.0000 | ORAL_TABLET | Freq: Every day | ORAL | Status: DC | PRN

## 2022-06-27 MED ORDER — CALCIUM CARBONATE ANTACID 500 MG PO CHEW
2.0000 | CHEWABLE_TABLET | Freq: Three times a day (TID) | ORAL | Status: DC
  Administered 2022-06-28: 400 mg via ORAL
  Filled 2022-06-27 (×2): qty 2

## 2022-06-27 MED ORDER — PROCHLORPERAZINE MALEATE 5 MG PO TABS: 5.0000 mg | ORAL_TABLET | Freq: Four times a day (QID) | ORAL | Status: DC | PRN

## 2022-06-27 MED ORDER — SODIUM CHLORIDE 0.9 % IV BOLUS: 250.0000 mL | Freq: Once | INTRAVENOUS | Status: DC

## 2022-06-27 MED ORDER — METHOCARBAMOL 500 MG PO TABS: 500.0000 mg | ORAL_TABLET | Freq: Three times a day (TID) | ORAL | Status: DC | PRN

## 2022-06-27 MED ORDER — ACETAMINOPHEN 650 MG RE SUPP: 650.0000 mg | Freq: Four times a day (QID) | RECTAL | Status: DC | PRN

## 2022-06-27 MED ORDER — CALCIUM GLUCONATE-NACL 1-0.675 GM/50ML-% IV SOLN
1.0000 g | Freq: Once | INTRAVENOUS | Status: AC
  Administered 2022-06-27: 1000 mg via INTRAVENOUS
  Filled 2022-06-27: qty 50

## 2022-06-27 MED ORDER — DEXTROSE 50 % IV SOLN: 0.0000 mL | INTRAVENOUS | Status: DC | PRN

## 2022-06-27 MED ORDER — PANTOPRAZOLE SODIUM 40 MG PO TBEC
40.0000 mg | DELAYED_RELEASE_TABLET | Freq: Two times a day (BID) | ORAL | Status: DC
  Administered 2022-06-28: 40 mg via ORAL
  Filled 2022-06-27: qty 1

## 2022-06-27 MED ORDER — LACTATED RINGERS IV BOLUS
1000.0000 mL | Freq: Once | INTRAVENOUS | Status: AC
  Administered 2022-06-27: 1000 mL via INTRAVENOUS

## 2022-06-27 MED ORDER — MAGNESIUM OXIDE -MG SUPPLEMENT 400 (240 MG) MG PO TABS
400.0000 mg | ORAL_TABLET | Freq: Two times a day (BID) | ORAL | Status: DC
  Administered 2022-06-28: 400 mg via ORAL
  Filled 2022-06-27: qty 1

## 2022-06-27 MED ORDER — METOCLOPRAMIDE HCL 5 MG/ML IJ SOLN
10.0000 mg | Freq: Four times a day (QID) | INTRAMUSCULAR | Status: DC | PRN
  Administered 2022-06-27: 10 mg via INTRAVENOUS
  Filled 2022-06-27: qty 2

## 2022-06-27 MED ORDER — TRAZODONE HCL 50 MG PO TABS: 25.0000 mg | ORAL_TABLET | Freq: Every evening | ORAL | Status: DC | PRN

## 2022-06-27 MED ORDER — CARVEDILOL 12.5 MG PO TABS
12.5000 mg | ORAL_TABLET | Freq: Two times a day (BID) | ORAL | Status: DC
  Administered 2022-06-28: 12.5 mg via ORAL
  Filled 2022-06-27: qty 1

## 2022-06-27 MED ORDER — CLONAZEPAM 0.5 MG PO TABS
0.5000 mg | ORAL_TABLET | Freq: Two times a day (BID) | ORAL | Status: DC
  Administered 2022-06-27 – 2022-06-28 (×2): 0.5 mg via ORAL
  Filled 2022-06-27 (×2): qty 1

## 2022-06-27 MED ORDER — HEPARIN SODIUM (PORCINE) 5000 UNIT/ML IJ SOLN
5000.0000 [IU] | Freq: Three times a day (TID) | INTRAMUSCULAR | Status: DC
  Administered 2022-06-27 – 2022-06-28 (×2): 5000 [IU] via SUBCUTANEOUS
  Filled 2022-06-27 (×3): qty 1

## 2022-06-27 MED ORDER — CALCITRIOL 0.25 MCG PO CAPS
0.2500 ug | ORAL_CAPSULE | Freq: Every morning | ORAL | Status: DC
  Administered 2022-06-28: 0.25 ug via ORAL
  Filled 2022-06-27: qty 1

## 2022-06-27 MED ORDER — ENOXAPARIN SODIUM 40 MG/0.4ML IJ SOSY: 40.0000 mg | PREFILLED_SYRINGE | INTRAMUSCULAR | Status: DC

## 2022-06-27 MED ORDER — MAGNESIUM HYDROXIDE 400 MG/5ML PO SUSP: 30.0000 mL | Freq: Every day | ORAL | Status: DC | PRN

## 2022-06-27 MED ORDER — HYDRALAZINE HCL 50 MG PO TABS
100.0000 mg | ORAL_TABLET | Freq: Three times a day (TID) | ORAL | Status: DC
  Administered 2022-06-27 – 2022-06-28 (×2): 100 mg via ORAL
  Filled 2022-06-27 (×2): qty 2

## 2022-06-27 MED ORDER — GENTAMICIN SULFATE 0.1 % EX CREA
1.0000 | TOPICAL_CREAM | Freq: Every day | CUTANEOUS | Status: DC
  Filled 2022-06-27: qty 15

## 2022-06-27 MED ORDER — LABETALOL HCL 5 MG/ML IV SOLN
20.0000 mg | INTRAVENOUS | Status: DC | PRN
  Administered 2022-06-28: 20 mg via INTRAVENOUS
  Filled 2022-06-27: qty 4

## 2022-06-27 MED ORDER — ISOSORBIDE MONONITRATE ER 30 MG PO TB24
30.0000 mg | ORAL_TABLET | Freq: Every day | ORAL | Status: DC
  Administered 2022-06-28: 30 mg via ORAL
  Filled 2022-06-27: qty 1

## 2022-06-27 NOTE — Progress Notes (Addendum)
                                                  Against Medical Advice   Patient at this time expresses desire to leave the Hospital immediately, patient has been warned that this is not Medically advisable at this time, and can result in Medical complications like Death and Disability, patient understands and accepts the risks involved and assumes full responsibilty of this decision.  This patient has also been advised that if they feel the need for further medical assistance to return to any available ER or dial [phone].  Informed by Nursing staff that this patient has left care and has signed the form  Against Medical Advice on 06/27/2022 at  0313 Hrs.  Gershon Cull BSN MSNA MSN ACNPC-AG Acute Care Nurse Practitioner [facility] [place]   Notified by ICU Charge RN while rounding that an RN was calling to inquire about patient as there was no other notification received by NP from any available means of contact. In spite of bedside visit and discussion with patient she has decided to leave AMA.  Gershon Cull BSN MSNA MSN ACNPC-AG Acute Care Nurse Practitioner [place]

## 2022-06-27 NOTE — Assessment & Plan Note (Addendum)
-   This should correct with peritoneal dialysis. - We will order 1 g of IV calcium gluconate specially given associated hypocalcemia. - We will continue her calcitriol.

## 2022-06-27 NOTE — ED Triage Notes (Addendum)
Patient BIB [facility] from home. Said she has been vomiting since yesterday. Type 1 diabetic. Not compliant with medications.

## 2022-06-27 NOTE — Assessment & Plan Note (Addendum)
-   We will continue her antihypertensives while holding off Entresto given hyperkalemia. - We will place her on as needed IV labetalol.

## 2022-06-27 NOTE — Assessment & Plan Note (Signed)
-   We will continue statin therapy. 

## 2022-06-27 NOTE — Consult Note (Signed)
Reason for Consult: ESRD Referring Physician:  Dr. Toy Baker  Chief Complaint: Vomiting  Home meds include - calcitriol, calc carbonate 2 tid, carvedilol 12.5 bid, hydralazine 100 tid, insulin lispro, metoclopramide 10 tid prn, sacubitril-valsartan, prns/ vits/ supps    OP PD: 4 exchanges overnight, no daybag, 1500 cc dwells, f/b [place] [facility]   Assessment/Plan: DKA - hx T1DM w/ intractable N/F. Should not need aggressive fluid resuscitation w/ ESRD hx but agree with IV insulin gtt. If needs BS support would use D5W at whatever dose needed (avoid LR and NS).  ESRD - on CCPD x ~1 year and 3 mos. F/b [place] [facility] (used to live in [place]). PD tonight once transferred w/ all 1.5% fluids as she does not appear to be grossly overloaded.  Hyperkalemia - K+ 5.9 here. Due to missed PD and acidemia. Can try lokelma but there should also be cellular shift with  IV insulin. Also will get PD tonight upon transfer.  HTN - prob not able to take pills here, can start clonidine patch #2 if needed and scheduled IV metoprolol 5mg  q 6hrs PRN.  Renal osteodystrophy - will check a phos; she is on TUMS 2 tabs TIDM Volume - not vol depleted, appears to be relative euvolemic and certainly not overloaded. H/o HFrEF - CXR    HPI: [facility] is an 26 y.o. female w/ hx of anxiety, bipolar d/o, HFrEF, DM1, ESRD on PD (only been on PD) for about a year, migraine, anemia who presented to ED w/ N/V seen initially in the ED yesterday but signed out AMA because she had to take care of business. She is back with the same symptoms, found to be in DKA w/ high anion gap, K+ 5.7, BUN 101 Cr 14.2. Started on IV insulin.    Pt seen in ED. Not in distress, but very uncomfortable due to nausea (+/- pain). No SOB, cough , has not aspirated.    Pt lives w/ her young daughter and finds it hard to do her PD sometimes if the child is acting up, or if she feels really sick .    ROS - denies CP, no joint pain, no HA, no blurry  vision, no rash, no dysuria, no difficulty voiding  ROS Pertinent items are noted in HPI.  Chemistry and CBC: Creatinine, Ser  Date/Time Value Ref Range Status  06/27/2022 06:00 PM 14.20 (H) 0.44 - 1.00 mg/dL Final  06/27/2022 05:30 PM 12.84 (H) 0.44 - 1.00 mg/dL Final  06/26/2022 09:02 PM 13.50 (H) 0.44 - 1.00 mg/dL Final  06/26/2022 08:54 PM 12.04 (H) 0.44 - 1.00 mg/dL Final  04/26/2022 11:35 AM 12.39 (H) 0.44 - 1.00 mg/dL Final  04/24/2022 07:42 AM 11.80 (H) 0.44 - 1.00 mg/dL Final  04/08/2022 06:40 PM 11.64 (H) 0.44 - 1.00 mg/dL Final  03/16/2022 04:34 AM 10.94 (H) 0.44 - 1.00 mg/dL Final  03/15/2022 03:12 PM 10.55 (H) 0.44 - 1.00 mg/dL Final  03/12/2022 04:40 AM 9.78 (H) 0.44 - 1.00 mg/dL Final  03/12/2022 02:57 AM 9.80 (H) 0.44 - 1.00 mg/dL Final  03/11/2022 04:13 AM 9.36 (H) 0.44 - 1.00 mg/dL Final  02/27/2022 05:42 AM 10.85 (H) 0.44 - 1.00 mg/dL Final  02/26/2022 01:02 PM 10.85 (H) 0.44 - 1.00 mg/dL Final  02/26/2022 09:42 AM 10.86 (H) 0.44 - 1.00 mg/dL Final  02/26/2022 07:00 AM 11.13 (H) 0.44 - 1.00 mg/dL Final  02/26/2022 01:58 AM 10.81 (H) 0.44 - 1.00 mg/dL Final  01/31/2022 06:15 AM 9.61 (H) 0.44 -  1.00 mg/dL Final  01/30/2022 06:08 PM 9.21 (H) 0.44 - 1.00 mg/dL Final  01/30/2022 02:15 PM 9.58 (H) 0.44 - 1.00 mg/dL Final  01/24/2022 08:00 AM 9.63 (H) 0.44 - 1.00 mg/dL Final  01/22/2022 04:47 PM 9.18 (H) 0.44 - 1.00 mg/dL Final  01/21/2022 02:35 PM 8.43 (H) 0.44 - 1.00 mg/dL Final  01/17/2022 05:14 AM 8.92 (H) 0.44 - 1.00 mg/dL Final  11/23/2021 01:45 AM 7.87 (H) 0.44 - 1.00 mg/dL Final  11/22/2021 10:27 AM 7.66 (H) 0.44 - 1.00 mg/dL Final  08/12/2021 03:46 AM 6.95 (H) 0.44 - 1.00 mg/dL Final  08/11/2021 11:43 AM 6.55 (H) 0.44 - 1.00 mg/dL Final  08/10/2021 02:34 AM 6.47 (H) 0.44 - 1.00 mg/dL Final  05/12/2021 06:47 PM 4.64 (H) 0.44 - 1.00 mg/dL Final  05/09/2021 02:07 AM 5.21 (H) 0.44 - 1.00 mg/dL Final  02/18/2021 05:05 AM 3.65 (H) 0.44 - 1.00 mg/dL Final   02/17/2021 01:14 AM 3.79 (H) 0.44 - 1.00 mg/dL Final  02/17/2021 01:14 AM 3.75 (H) 0.44 - 1.00 mg/dL Final  02/16/2021 09:07 PM 3.83 (H) 0.44 - 1.00 mg/dL Final  02/16/2021 05:19 PM 3.80 (H) 0.44 - 1.00 mg/dL Final  02/16/2021 01:18 PM 4.22 (H) 0.44 - 1.00 mg/dL Final  02/16/2021 09:52 AM 4.11 (H) 0.44 - 1.00 mg/dL Final  10/06/2020 03:22 AM 2.38 (H) 0.44 - 1.00 mg/dL Final  07/11/2020 01:42 AM 2.68 (H) 0.44 - 1.00 mg/dL Final  07/10/2020 05:06 PM 2.37 (H) 0.44 - 1.00 mg/dL Final  06/27/2020 07:53 PM 2.13 (H) 0.44 - 1.00 mg/dL Final  06/14/2020 01:46 AM 2.26 (H) 0.44 - 1.00 mg/dL Final  10/28/2019 07:33 PM 1.61 (H) 0.44 - 1.00 mg/dL Final  10/27/2019 09:59 AM 1.52 (H) 0.44 - 1.00 mg/dL Final  10/26/2019 06:30 AM 1.55 (H) 0.44 - 1.00 mg/dL Final  10/25/2019 07:37 AM 1.53 (H) 0.44 - 1.00 mg/dL Final  10/24/2019 07:44 AM 1.59 (H) 0.44 - 1.00 mg/dL Final  10/23/2019 06:10 AM 1.77 (H) 0.44 - 1.00 mg/dL Final  10/22/2019 10:08 PM 1.71 (H) 0.44 - 1.00 mg/dL Final  10/22/2019 12:07 PM 1.90 (H) 0.44 - 1.00 mg/dL Final  10/22/2019 05:37 AM 1.75 (H) 0.44 - 1.00 mg/dL Final   Recent Labs  Lab 06/26/22 2054 06/26/22 2102 06/27/22 1730 06/27/22 1800  NA 135 133* 135 133*  K 5.5* 5.4* 5.8* 5.7*  CL 98 101 101 101  CO2 14*  --  15*  --   GLUCOSE 597* 608* 464* 470*  BUN 91* 81* 99* 101*  CREATININE 12.04* 13.50* 12.84* 14.20*  CALCIUM 7.1*  --  7.3*  --    Recent Labs  Lab 06/26/22 2054 06/26/22 2102 06/27/22 1730 06/27/22 1800  WBC 6.0  --  9.2  --   NEUTROABS 5.5  --  8.2*  --   HGB 12.1 13.3 12.2 13.3  HCT 37.3 39.0 36.9 39.0  MCV 96.1  --  93.9  --   PLT 221  --  233  --    Liver Function Tests: Recent Labs  Lab 06/27/22 1730  AST 22  ALT 18  ALKPHOS 75  BILITOT 0.9  PROT 6.5  ALBUMIN 3.2*   No results for input(s): "LIPASE", "AMYLASE" in the last 168 hours. No results for input(s): "AMMONIA" in the last 168 hours. Cardiac Enzymes: No results for input(s):  "CKTOTAL", "CKMB", "CKMBINDEX", "TROPONINI" in the last 168 hours. Iron Studies: No results for input(s): "IRON", "TIBC", "TRANSFERRIN", "FERRITIN" in the last 72 hours. PT/INR: @LABRCNTIP (inr:5)  Xrays/Other Studies: ) Results for orders placed or performed during the hospital encounter of 06/27/22 (from the past 48 hour(s))  CBG monitoring, ED     Status: Abnormal   Collection Time: 06/27/22  3:34 PM  Result Value Ref Range   Glucose-Capillary 388 (H) 70 - 99 mg/dL    Comment: Glucose reference range applies only to samples taken after fasting for at least 8 hours.   Comment 1 Notify RN    Comment 2 Document in Chart   CBC with Differential     Status: Abnormal   Collection Time: 06/27/22  5:30 PM  Result Value Ref Range   WBC 9.2 4.0 - 10.5 K/uL   RBC 3.93 3.87 - 5.11 MIL/uL   Hemoglobin 12.2 12.0 - 15.0 g/dL   HCT 36.9 36.0 - 46.0 %   MCV 93.9 80.0 - 100.0 fL   MCH 31.0 26.0 - 34.0 pg   MCHC 33.1 30.0 - 36.0 g/dL   RDW 14.6 11.5 - 15.5 %   Platelets 233 150 - 400 K/uL   nRBC 0.0 0.0 - 0.2 %   Neutrophils Relative % 89 %   Neutro Abs 8.2 (H) 1.7 - 7.7 K/uL   Lymphocytes Relative 8 %   Lymphs Abs 0.7 0.7 - 4.0 K/uL   Monocytes Relative 3 %   Monocytes Absolute 0.3 0.1 - 1.0 K/uL   Eosinophils Relative 0 %   Eosinophils Absolute 0.0 0.0 - 0.5 K/uL   Basophils Relative 0 %   Basophils Absolute 0.0 0.0 - 0.1 K/uL   Immature Granulocytes 0 %   Abs Immature Granulocytes 0.03 0.00 - 0.07 K/uL    Comment: Performed at [facility], [place] [address], [place], [place] [address]  Blood gas, venous (at [facility], not at [facility])     Status: Abnormal   Collection Time: 06/27/22  5:30 PM  Result Value Ref Range   pH, Ven 7.28 7.25 - 7.43   pCO2, Ven 33 (L) 44 - 60 mmHg   pO2, Ven 68 (H) 32 - 45 mmHg   Bicarbonate 15.5 (L) 20.0 - 28.0 mmol/L   Acid-base deficit 10.2 (H) 0.0 - 2.0 mmol/L   O2 Saturation 89.8 %   Patient temperature 37.0     Comment: Performed at  [facility], [place] [address], [place], [place] [address]  Beta-hydroxybutyric acid     Status: Abnormal   Collection Time: 06/27/22  5:30 PM  Result Value Ref Range   Beta-Hydroxybutyric Acid 1.12 (H) 0.05 - 0.27 mmol/L    Comment: Performed at [facility], [place] [address] [place], [place] [address]  Brain natriuretic peptide     Status: Abnormal   Collection Time: 06/27/22  5:30 PM  Result Value Ref Range   B Natriuretic Peptide >4,500.0 (H) 0.0 - 100.0 pg/mL    Comment: Performed at [facility], [place] [address], [place], [place] [address]  Troponin I (High Sensitivity)     Status: Abnormal   Collection Time: 06/27/22  5:30 PM  Result Value Ref Range   Troponin I (High Sensitivity) 186 (HH) <18 ng/L    Comment: CRITICAL RESULT CALLED TO, READ BACK BY AND VERIFIED WITH Ophelia Shoulder, RN 06/27/22 1826 J. COLE (NOTE) Elevated high sensitivity troponin I (hsTnI) values and significant  changes across serial measurements may suggest ACS but many other  chronic and acute conditions are known to elevate hsTnI results.  Refer to the "Links" section for  chest pain algorithms and additional  guidance. Performed at [facility], [place] [address] [place], [place] [address]   Magnesium     Status: None   Collection Time: 06/27/22  5:30 PM  Result Value Ref Range   Magnesium 2.4 1.7 - 2.4 mg/dL    Comment: Performed at [facility], [place] [address] [place], [place] [address]  Comprehensive metabolic panel     Status: Abnormal   Collection Time: 06/27/22  5:30 PM  Result Value Ref Range   Sodium 135 135 - 145 mmol/L   Potassium 5.8 (H) 3.5 - 5.1 mmol/L   Chloride 101 98 - 111 mmol/L   CO2 15 (L) 22 - 32 mmol/L   Glucose, Bld 464 (H) 70 - 99 mg/dL    Comment: Glucose reference range applies only to samples taken after fasting for at least 8 hours.   BUN 99 (H) 6 - 20 mg/dL   Creatinine, Ser 12.84 (H)  0.44 - 1.00 mg/dL   Calcium 7.3 (L) 8.9 - 10.3 mg/dL   Total Protein 6.5 6.5 - 8.1 g/dL   Albumin 3.2 (L) 3.5 - 5.0 g/dL   AST 22 15 - 41 U/L   ALT 18 0 - 44 U/L   Alkaline Phosphatase 75 38 - 126 U/L   Total Bilirubin 0.9 0.3 - 1.2 mg/dL   GFR, Estimated 4 (L) >60 mL/min    Comment: (NOTE) Calculated using the CKD-EPI Creatinine Equation (2021)    Anion gap 19 (H) 5 - 15    Comment: Performed at [facility], [place] [address], [place], [place] [address]  CBG monitoring, ED     Status: Abnormal   Collection Time: 06/27/22  5:32 PM  Result Value Ref Range   Glucose-Capillary 450 (H) 70 - 99 mg/dL    Comment: Glucose reference range applies only to samples taken after fasting for at least 8 hours.  I-stat chem 8, ED     Status: Abnormal   Collection Time: 06/27/22  6:00 PM  Result Value Ref Range   Sodium 133 (L) 135 - 145 mmol/L   Potassium 5.7 (H) 3.5 - 5.1 mmol/L   Chloride 101 98 - 111 mmol/L   BUN 101 (H) 6 - 20 mg/dL   Creatinine, Ser 14.20 (H) 0.44 - 1.00 mg/dL   Glucose, Bld 470 (H) 70 - 99 mg/dL    Comment: Glucose reference range applies only to samples taken after fasting for at least 8 hours.   Calcium, Ion 0.88 (LL) 1.15 - 1.40 mmol/L   TCO2 17 (L) 22 - 32 mmol/L   Hemoglobin 13.3 12.0 - 15.0 g/dL   HCT 39.0 36.0 - 46.0 %   Comment NOTIFIED PHYSICIAN   CBG monitoring, ED     Status: Abnormal   Collection Time: 06/27/22  7:40 PM  Result Value Ref Range   Glucose-Capillary 438 (H) 70 - 99 mg/dL    Comment: Glucose reference range applies only to samples taken after fasting for at least 8 hours.   DG Chest Port 1 View  Result Date: 06/27/2022 CLINICAL DATA:  Diabetic ketoacidosis. EXAM: PORTABLE CHEST 1 VIEW COMPARISON:  Chest radiograph dated 06/26/2022. FINDINGS: Cardiomegaly with vascular congestion similar or slightly worsened since the prior radiograph. No focal consolidation, pleural effusion pneumothorax. No acute osseous pathology.  IMPRESSION: Cardiomegaly with vascular congestion. No focal consolidation. Electronically Signed   By: Anner Crete M.D.   On: 06/27/2022 20:05   DG CHEST PORT  1 VIEW  Result Date: 06/26/2022 CLINICAL DATA:  DKA EXAM: PORTABLE CHEST 1 VIEW COMPARISON:  Chest x-ray 03/06/2022.  Chest CT 11/22/2021. FINDINGS: Marked cardiomegaly is unchanged. There is central pulmonary vascular congestion. There is no focal lung consolidation, pleural effusion, or pneumothorax. No acute fractures are seen. IMPRESSION: Marked cardiomegaly with central pulmonary vascular congestion. Electronically Signed   By: Ronney Asters M.D.   On: 06/26/2022 23:38    PMH:   Past Medical History:  Diagnosis Date   Anemia    Anxiety    Bipolar 2 disorder (HCC)    Chronic kidney disease    Chronic systolic (congestive) heart failure (HCC)    Depression    DKA (diabetic ketoacidoses)    ESRD on peritoneal dialysis ([place])    HSV infection    on valtrex   Hypokalemia    Leukocytosis    Migraine    Noncompliance with medication regimen    Preeclampsia    Severe anemia    Type 1 diabetes mellitus (HCC)     PSH:   Past Surgical History:  Procedure Laterality Date   BIOPSY  04/24/2022   Procedure: BIOPSY;  Surgeon: [name], MD;  Location: [place];  Service: Gastroenterology;;   CARDIAC CATHETERIZATION     COLONOSCOPY WITH PROPOFOL N/A 04/24/2022   Procedure: COLONOSCOPY WITH PROPOFOL;  Surgeon: [name], MD;  Location: [place];  Service: Gastroenterology;  Laterality: N/A;   DILATION AND EVACUATION N/A 10/22/2019   Procedure: ULTRASOUND GUIDED DILATATION AND EVACUATION;  Surgeon: [name], MD;  Location: [facility];  Service: Gynecology;  Laterality: N/A;   ESOPHAGOGASTRODUODENOSCOPY (EGD) WITH PROPOFOL N/A 04/24/2022   Procedure: ESOPHAGOGASTRODUODENOSCOPY (EGD) WITH PROPOFOL;  Surgeon: [name], MD;  Location: [place];  Service: Gastroenterology;  Laterality: N/A;    NO PAST SURGERIES     peritoneal dialysis catheter insertion     RENAL BIOPSY      Allergies:  Allergies  Allergen Reactions   Cantaloupe Extract Allergy Skin Test Itching    Mouth itching     Strawberry Extract Itching    Mouth itches   Citrullus Vulgaris Itching    Makes mouth itch , ALL melons    Nsaids Other (See Comments)    Avoid per nephrology Other reaction(s): Other Avoid per nephrology Avoid per nephrology    Medications:   Prior to Admission medications   Medication Sig Start Date End Date Taking? Authorizing Provider  acetaminophen (TYLENOL) 500 MG tablet Take 1,000 mg by mouth every 6 (six) hours as needed for headache (pain).    [provider]  amitriptyline (ELAVIL) 10 MG tablet Take 1 tablet (10 mg total) by mouth at bedtime. 05/25/22   [name], MD  atorvastatin (LIPITOR) 40 MG tablet Take 40 mg by mouth daily.    [provider]  butalbital-acetaminophen-caffeine (FIORICET) 50-325-40 MG tablet Take 1 tablet by mouth daily as needed for headache.    [provider]  calcitRIOL (ROCALTROL) 0.25 MCG capsule Take 0.25 mcg by mouth every morning. 08/03/21   [provider]  calcium carbonate (TUMS - DOSED IN MG ELEMENTAL CALCIUM) 500 MG chewable tablet Take 2 tablets by mouth 3 (three) times daily with meals.    [provider]  carvedilol (COREG) 12.5 MG tablet Take 12.5 mg by mouth 2 (two) times daily with a meal.    [provider]  clonazePAM (KLONOPIN) 0.5 MG tablet Take 1 tablet (0.5 mg total) by mouth 2 (  two) times daily as needed for anxiety. Patient taking differently: Take 0.5 mg by mouth 2 (two) times daily. 03/16/22   [name], MD  ENTRESTO 97-103 MG Take 2 tablets by mouth daily. 04/03/22   [provider]  hydrALAZINE (APRESOLINE) 100 MG tablet Take 100 mg by mouth 3 (three) times daily. 08/05/21   [provider]  insulin lispro (HUMALOG) 100 UNIT/ML injection  Inject into the skin continuous. Via Insulin Pump 01/08/22   [provider]  isosorbide mononitrate (IMDUR) 30 MG 24 hr tablet Take 30 mg by mouth in the morning and at bedtime.    [provider]  magnesium oxide (MAG-OX) 400 MG tablet Take 400 mg by mouth 2 (two) times daily. 08/03/21   [provider]  methocarbamol (ROBAXIN) 500 MG tablet Take 500 mg by mouth every 8 (eight) hours as needed for muscle spasms.    [provider]  metoCLOPramide (REGLAN) 10 MG tablet Take 1 tablet (10 mg total) by mouth every 8 (eight) hours as needed for nausea. 01/17/22   Lucrezia Starch, MD  ondansetron (ZOFRAN) 4 MG tablet Take 1 tablet (4 mg total) by mouth every 6 (six) hours. Patient taking differently: Take 4 mg by mouth every 6 (six) hours as needed for nausea or vomiting. 04/11/22   [name], PA  pantoprazole (PROTONIX) 40 MG tablet Take 1 tablet (40 mg total) by mouth 2 (two) times daily before a meal. 04/11/22   Lemmon, Lavone Nian, PA  prochlorperazine (COMPAZINE) 5 MG tablet Take 1 tablet (5 mg total) by mouth every 6 (six) hours as needed for nausea or vomiting. 01/21/22   Regan Lemming, MD  promethazine (PHENERGAN) 25 MG suppository Place 1 suppository (25 mg total) rectally as needed for nausea or vomiting. 04/11/22   [name], PA  Vitamin D, Ergocalciferol, (DRISDOL) 1.25 MG (50000 UNIT) CAPS capsule Take 50,000 Units by mouth every Sunday.    [provider]  escitalopram (LEXAPRO) 10 MG tablet Take 20 mg by mouth daily.  05/10/20 10/06/20  [provider]  furosemide (LASIX) 40 MG tablet Take 1 tablet (40 mg total) by mouth daily. Patient not taking: No sig reported 10/28/19 02/17/21  [name], MD  lisinopril (ZESTRIL) 10 MG tablet Take 10 mg by mouth daily.  02/23/20 10/06/20  [provider]  spironolactone (ALDACTONE) 25 MG tablet Take 25 mg by mouth daily.  04/20/20 10/06/20  [provider]     Discontinued Meds:   Medications Discontinued During This Encounter  Medication Reason   sodium chloride 0.9 % bolus 250 mL     Social History:  reports that she has quit smoking. Her smoking use included cigars and cigarettes. She has a 2.00 pack-year smoking history. She has never used smokeless tobacco. She reports that she does not drink alcohol and does not use drugs.  Family History:   Family History  Adopted: Yes  Problem Relation Age of Onset   Heart disease Neg Hx     Blood pressure (!) 149/119, pulse (!) 112, temperature 97.8 F (36.6 C), temperature source Oral, resp. rate (!) 22, SpO2 98 %. Gen alert, small framed AAF, looks comfortable with no ^wob  No rash, cyanosis or gangrene Sclera anicteric, throat clear  No jvd or bruits Chest clear bilat to bases, no rales/ wheezing RRR no MRG Abd soft ntnd no mass or ascites +bs, +PD cath intact on right side of abdomen MS no joint effusions or deformity  Ext no LE or UE edema, no wounds or ulcers Neuro is alert, Ox 3 , nf       Rajeev Escue, Hunt Oris, MD 06/27/2022, 8:21 PM

## 2022-06-27 NOTE — ED Notes (Signed)
Pt states she is going to leave, took off the monitor leads,pulled her IV out that was infusing the insulin, talked to patient and informed her I will reach out to floor coverage, paged J. Olena Heckle four times no return call, paged C. Hall and she stated I had to reach out to [place], will try to reach him again. Will try and call to ICU to see if he is available

## 2022-06-27 NOTE — ED Notes (Signed)
Report given to [name] transport personnel, care handed over to transport, patient en route to [facility] ED at this time. All paperwork sent with transport.

## 2022-06-27 NOTE — H&P (Signed)
[place]   PATIENT NAME: Kathy Lewis    MR#:  [id number]  DATE OF BIRTH:  1996/06/13  DATE OF ADMISSION:  06/27/2022  PRIMARY CARE PHYSICIAN: Patient, No Pcp Per   Patient is coming from: Home  REQUESTING/REFERRING PHYSICIAN: Aletta Edouard, MD  CHIEF COMPLAINT:   Chief Complaint  Patient presents with   Hyperglycemia    HISTORY OF PRESENT ILLNESS:  Kathy Lewis is a 26 y.o. record American female with medical history significant for  DM type I, bipolar, end-stage renal disease on peritoneal dialysis, anemia, recurrent DKA, dyslipidemia who presented to the ER last night with DKA and while waiting in the ED at [facility] for a progressive unit bed at [facility], she left AMA early this morning.  She has been having nausea, vomiting and dizziness with blood glucose of 578 last night with no dyspnea or chest pain or cough or wheezing.  No diarrhea or melena outfracturing per rectum.  She stated that her insulin pump has been malfunctioning for the last few days.  Her blood glucose levels have been elevated.  She has been trying to cover herself with subcutaneous insulin.  She continued to have recurrent nausea and vomiting and feeling weak and dizzy after leaving AMA.  She therefore came back to the ER.  She denies any fever or chills.  She makes very little urine and denies any dysuria, urinary frequency or urgency or flank pain.  ED Course: When she came to the ER tonight, BP was 148/113 with heart rate of 119 and otherwise normal vital signs.  BP later on was up to 166/141.  Labs revealed a potassium of 5.7 and glucose of 478 with BUN of 101 and creatinine of 14.2 compared to 13.5 last night and nice calcium of 0.88 and high sensitive troponin I was 186 and later 180.  CBC was within normal.  Beta hydroxybutyrate was 1.12 EKG as reviewed by me : EKG showed sinus tachycardia with rate of 107 with left atrial enlargement and prolonged QT interval with QTc of 531 MS. Imaging: Portable  chest x-ray showed cardiomegaly with vascular congestion with no focal consolidation.  The patient was given IV insulin drip per DKA protocol.  Arrangement was made for ED to ED transfer to [facility] from [facility].  She will be admitted to a stepdown unit bed there when bed is available, for further evaluation and management. PAST MEDICAL HISTORY:   Past Medical History:  Diagnosis Date   Anemia    Anxiety    Bipolar 2 disorder (HCC)    Chronic kidney disease    Chronic systolic (congestive) heart failure (HCC)    Depression    DKA (diabetic ketoacidoses)    ESRD on peritoneal dialysis ([place])    HSV infection    on valtrex   Hypokalemia    Leukocytosis    Migraine    Noncompliance with medication regimen    Preeclampsia    Severe anemia    Type 1 diabetes mellitus ([place])     PAST SURGICAL HISTORY:   Past Surgical History:  Procedure Laterality Date   BIOPSY  04/24/2022   Procedure: BIOPSY;  Surgeon: [name], MD;  Location: [place];  Service: Gastroenterology;;   CARDIAC CATHETERIZATION     COLONOSCOPY WITH PROPOFOL N/A 04/24/2022   Procedure: COLONOSCOPY WITH PROPOFOL;  Surgeon: [name], MD;  Location: [place];  Service: Gastroenterology;  Laterality: N/A;   DILATION AND EVACUATION N/A 10/22/2019  Procedure: ULTRASOUND GUIDED DILATATION AND EVACUATION;  Surgeon: [name], MD;  Location: [facility];  Service: Gynecology;  Laterality: N/A;   ESOPHAGOGASTRODUODENOSCOPY (EGD) WITH PROPOFOL N/A 04/24/2022   Procedure: ESOPHAGOGASTRODUODENOSCOPY (EGD) WITH PROPOFOL;  Surgeon: [name], MD;  Location: [place];  Service: Gastroenterology;  Laterality: N/A;   NO PAST SURGERIES     peritoneal dialysis catheter insertion     RENAL BIOPSY      SOCIAL HISTORY:   Social History   Tobacco Use   Smoking status: Former    Packs/day: 1.00    Years: 2.00    Total pack years: 2.00    Types: Cigars, Cigarettes   Smokeless tobacco: Never   Substance Use Topics   Alcohol use: No    FAMILY HISTORY:   Family History  Adopted: Yes  Problem Relation Age of Onset   Heart disease Neg Hx     DRUG ALLERGIES:   Allergies  Allergen Reactions   Cantaloupe Extract Allergy Skin Test Itching    Mouth itching     Strawberry Extract Itching    Mouth itches   Citrullus Vulgaris Itching    Makes mouth itch , ALL melons    Nsaids Other (See Comments)    Avoid per nephrology Other reaction(s): Other Avoid per nephrology Avoid per nephrology    REVIEW OF SYSTEMS:   ROS As per history of present illness. All pertinent systems were reviewed above. Constitutional, HEENT, cardiovascular, respiratory, GI, GU, musculoskeletal, neuro, psychiatric, endocrine, integumentary and hematologic systems were reviewed and are otherwise negative/unremarkable except for positive findings mentioned above in the HPI.   MEDICATIONS AT HOME:   Prior to Admission medications   Medication Sig Start Date End Date Taking? Authorizing Provider  acetaminophen (TYLENOL) 500 MG tablet Take 1,000 mg by mouth every 6 (six) hours as needed for headache (pain).    [provider]  amitriptyline (ELAVIL) 10 MG tablet Take 1 tablet (10 mg total) by mouth at bedtime. 05/25/22   [name], MD  atorvastatin (LIPITOR) 40 MG tablet Take 40 mg by mouth daily.    [provider]  butalbital-acetaminophen-caffeine (FIORICET) 50-325-40 MG tablet Take 1 tablet by mouth daily as needed for headache.    [provider]  calcitRIOL (ROCALTROL) 0.25 MCG capsule Take 0.25 mcg by mouth every morning. 08/03/21   [provider]  calcium carbonate (TUMS - DOSED IN MG ELEMENTAL CALCIUM) 500 MG chewable tablet Take 2 tablets by mouth 3 (three) times daily with meals.    [provider]  carvedilol (COREG) 12.5 MG tablet Take 12.5 mg by mouth 2 (two) times daily with a meal.    [provider]  clonazePAM (KLONOPIN)  0.5 MG tablet Take 1 tablet (0.5 mg total) by mouth 2 (two) times daily as needed for anxiety. Patient taking differently: Take 0.5 mg by mouth 2 (two) times daily. 03/16/22   [name], MD  ENTRESTO 97-103 MG Take 2 tablets by mouth daily. 04/03/22   [provider]  hydrALAZINE (APRESOLINE) 100 MG tablet Take 100 mg by mouth 3 (three) times daily. 08/05/21   [provider]  insulin lispro (HUMALOG) 100 UNIT/ML injection Inject into the skin continuous. Via Insulin Pump 01/08/22   [provider]  isosorbide mononitrate (IMDUR) 30 MG 24 hr tablet Take 30 mg by mouth in the morning and at bedtime.    [provider]  magnesium oxide (MAG-OX) 400 MG tablet Take 400 mg by mouth  2 (two) times daily. 08/03/21   [provider]  methocarbamol (ROBAXIN) 500 MG tablet Take 500 mg by mouth every 8 (eight) hours as needed for muscle spasms.    [provider]  metoCLOPramide (REGLAN) 10 MG tablet Take 1 tablet (10 mg total) by mouth every 8 (eight) hours as needed for nausea. 01/17/22   Lucrezia Starch, MD  ondansetron (ZOFRAN) 4 MG tablet Take 1 tablet (4 mg total) by mouth every 6 (six) hours. Patient taking differently: Take 4 mg by mouth every 6 (six) hours as needed for nausea or vomiting. 04/11/22   [name], PA  pantoprazole (PROTONIX) 40 MG tablet Take 1 tablet (40 mg total) by mouth 2 (two) times daily before a meal. 04/11/22   Lemmon, Lavone Nian, PA  prochlorperazine (COMPAZINE) 5 MG tablet Take 1 tablet (5 mg total) by mouth every 6 (six) hours as needed for nausea or vomiting. 01/21/22   Regan Lemming, MD  promethazine (PHENERGAN) 25 MG suppository Place 1 suppository (25 mg total) rectally as needed for nausea or vomiting. 04/11/22   [name], PA  Vitamin D, Ergocalciferol, (DRISDOL) 1.25 MG (50000 UNIT) CAPS capsule Take 50,000 Units by mouth every Sunday.    [provider]  escitalopram (LEXAPRO) 10  MG tablet Take 20 mg by mouth daily.  05/10/20 10/06/20  [provider]  furosemide (LASIX) 40 MG tablet Take 1 tablet (40 mg total) by mouth daily. Patient not taking: No sig reported 10/28/19 02/17/21  [name], MD  lisinopril (ZESTRIL) 10 MG tablet Take 10 mg by mouth daily.  02/23/20 10/06/20  [provider]  spironolactone (ALDACTONE) 25 MG tablet Take 25 mg by mouth daily.  04/20/20 10/06/20  [provider]      VITAL SIGNS:  Blood pressure (!) 160/126, pulse (!) 122, temperature 97.6 F (36.4 C), temperature source Oral, resp. rate 20, SpO2 96 %.  PHYSICAL EXAMINATION:  Physical Exam  GENERAL: Acutely ill 26 y.o.-year-old African-American female patient lying in the bed with no acute distress however with mild conversational dyspnea. EYES: Pupils equal, round, reactive to light and accommodation. No scleral icterus. Extraocular muscles intact.  HEENT: Head atraumatic, normocephalic. Oropharynx with slightly dry mucous membrane and tongue and nasopharynx clear.  NECK:  Supple, no jugular venous distention. No thyroid enlargement, no tenderness.  LUNGS: Normal breath sounds bilaterally, no wheezing, rales,rhonchi or crepitation. No use of accessory muscles of respiration.  CARDIOVASCULAR: Regular rate and rhythm, S1, S2 normal. No murmurs, rubs, or gallops.  ABDOMEN: Soft, nondistended, nontender. Bowel sounds present. No organomegaly or mass.  EXTREMITIES: No pedal edema, cyanosis, or clubbing.  NEUROLOGIC: Cranial nerves II through XII are intact. Muscle strength 5/5 in all extremities. Sensation intact. Gait not checked.  PSYCHIATRIC: The patient is alert and oriented x 3.  Normal affect and good eye contact. SKIN: No obvious rash, lesion, or ulcer.   LABORATORY PANEL:   CBC Recent Labs  Lab 06/27/22 1730 06/27/22 1800  WBC 9.2  --   HGB 12.2 13.3  HCT 36.9 39.0  PLT 233  --     ------------------------------------------------------------------------------------------------------------------  Chemistries  Recent Labs  Lab 06/27/22 1730 06/27/22 1800  NA 135 133*  K 5.8* 5.7*  CL 101 101  CO2 15*  --   GLUCOSE 464* 470*  BUN 99* 101*  CREATININE 12.84* 14.20*  CALCIUM 7.3*  --   MG 2.4  --   AST 22  --   ALT 18  --  ALKPHOS 75  --   BILITOT 0.9  --    ------------------------------------------------------------------------------------------------------------------  Cardiac Enzymes No results for input(s): "TROPONINI" in the last 168 hours. ------------------------------------------------------------------------------------------------------------------  RADIOLOGY:  DG Chest Port 1 View  Result Date: 06/27/2022 CLINICAL DATA:  Diabetic ketoacidosis. EXAM: PORTABLE CHEST 1 VIEW COMPARISON:  Chest radiograph dated 06/26/2022. FINDINGS: Cardiomegaly with vascular congestion similar or slightly worsened since the prior radiograph. No focal consolidation, pleural effusion pneumothorax. No acute osseous pathology. IMPRESSION: Cardiomegaly with vascular congestion. No focal consolidation. Electronically Signed   By: Anner Crete M.D.   On: 06/27/2022 20:05   DG CHEST PORT 1 VIEW  Result Date: 06/26/2022 CLINICAL DATA:  DKA EXAM: PORTABLE CHEST 1 VIEW COMPARISON:  Chest x-ray 03/06/2022.  Chest CT 11/22/2021. FINDINGS: Marked cardiomegaly is unchanged. There is central pulmonary vascular congestion. There is no focal lung consolidation, pleural effusion, or pneumothorax. No acute fractures are seen. IMPRESSION: Marked cardiomegaly with central pulmonary vascular congestion. Electronically Signed   By: Ronney Asters M.D.   On: 06/26/2022 23:38      IMPRESSION AND PLAN:  Assessment and Plan: * DKA, type 1 ([place]) - The patient will be admitted to stepdown unit bed. - We will continue her on IV insulin drip per DKA protocol. - We will hold off on  further hydration pending peritoneal dialysis. - We will follow serial BMPs.   End-stage renal disease on peritoneal dialysis [facility]) - Dr. Augustin Coupe was notified about the patient and will attempt to the patient at [facility] for peritoneal dialysis tonight specially given vascular congestion..  Hyperkalemia - This should correct with peritoneal dialysis. - We will order 1 g of IV calcium gluconate specially given associated hypocalcemia. - We will continue her calcitriol.  Hypertensive urgency - We will continue her antihypertensives while holding off Entresto given hyperkalemia. - We will place her on as needed IV labetalol.  Dyslipidemia We will continue statin therapy.  Bipolar 2 disorder (HCC) - We will continue her Klonopin.   DVT prophylaxis: Subcutaneous heparin. Advanced Care Planning:  Code Status: full code  Family Communication:  The plan of care was discussed in details with the patient (and family). I answered all questions. The patient agreed to proceed with the above mentioned plan. Further management will depend upon hospital course. Disposition Plan: Back to previous home environment Consults called: Nephrology. All the records are reviewed and case discussed with ED provider.  Status is: Inpatient   At the time of the admission, it appears that the appropriate admission status for this patient is inpatient.  This is judged to be reasonable and necessary in order to provide the required intensity of service to ensure the patient's safety given the presenting symptoms, physical exam findings and initial radiographic and laboratory data in the context of comorbid conditions.  The patient requires inpatient status due to high intensity of service, high risk of further deterioration and high frequency of surveillance required.  I certify that at the time of admission, it is my clinical judgment that the patient will require inpatient hospital care extending more than 2  midnights.                            Dispo: The patient is from: Home              Anticipated d/c is to: Home              Patient currently is not medically stable  to d/c.              Difficult to place patient: No   Authorized and performed by: Eugenie Norrie, MD Total critical care time: Approximately   60    minutes. Due to a high probability of clinically significant, life-threatening deterioration, the patient required my highest level of preparedness to intervene emergently and I personally spent this critical care time directly and personally managing the patient.  This critical care time included obtaining a history, examining the patient, pulse oximetry, ordering and review of studies, arranging urgent treatment with development of management plan, evaluation of patient's response to treatment, frequent reassessment, and discussions with other providers. This critical care time was performed to assess and manage the high probability of imminent, life-threatening deterioration that could result in multiorgan failure.  It was exclusive of separately billable procedures and treating other patients and teaching time.    Christel Mormon M.D on 06/27/2022 at 11:02 PM  [facility]   From 7 PM-7 AM, contact night-coverage [email or web address]  CC: Primary care physician; Patient, No Pcp Per

## 2022-06-27 NOTE — Assessment & Plan Note (Signed)
-   The patient will be admitted to stepdown unit bed. - We will continue her on IV insulin drip per DKA protocol. - We will hold off on further hydration pending peritoneal dialysis. - We will follow serial BMPs.

## 2022-06-27 NOTE — ED Provider Notes (Signed)
[facility] DEPT Provider Note   CSN: [id number] Arrival date & time: 06/27/22  1526     History {Add pertinent medical, surgical, social history, OB history to HPI:1} Chief Complaint  Patient presents with   Hyperglycemia    Kathy Lewis is a 26 y.o. female.  She has type 1 diabetes and is on insulin pump.  Also has end-stage renal disease and does peritoneal dialysis at night.  She said her insulin pump may have malfunctioned a few days ago and since then she has had nausea and vomiting and elevated blood sugars.  She has been trying to cover herself with subcu insulin.  She said she has vomited numerous times nonbloody.  No diarrhea.  No urinary symptoms.  No known fevers or chills.  She was here yesterday for same and admitted although left AMA.  Continues to feel unwell.  The history is provided by the patient.  Hyperglycemia Severity:  Moderate Onset quality:  Gradual Duration:  2 days Timing:  Constant Progression:  Unchanged Chronicity:  Recurrent Diabetes status:  Controlled with insulin Context: insulin pump use   Relieved by:  Nothing Ineffective treatments:  Insulin Associated symptoms: nausea, vomiting and weakness   Associated symptoms: no abdominal pain, no altered mental status, no chest pain, no dysuria, no fever and no shortness of breath   Risk factors: hx of DKA        Home Medications Prior to Admission medications   Medication Sig Start Date End Date Taking? Authorizing Provider  acetaminophen (TYLENOL) 500 MG tablet Take 1,000 mg by mouth every 6 (six) hours as needed for headache (pain).    [provider]  amitriptyline (ELAVIL) 10 MG tablet Take 1 tablet (10 mg total) by mouth at bedtime. 05/25/22   [name], MD  atorvastatin (LIPITOR) 40 MG tablet Take 40 mg by mouth daily.    [provider]  butalbital-acetaminophen-caffeine (FIORICET) 50-325-40 MG tablet Take 1 tablet by mouth daily as  needed for headache.    [provider]  calcitRIOL (ROCALTROL) 0.25 MCG capsule Take 0.25 mcg by mouth every morning. 08/03/21   [provider]  calcium carbonate (TUMS - DOSED IN MG ELEMENTAL CALCIUM) 500 MG chewable tablet Take 2 tablets by mouth 3 (three) times daily with meals.    [provider]  carvedilol (COREG) 12.5 MG tablet Take 12.5 mg by mouth 2 (two) times daily with a meal.    [provider]  clonazePAM (KLONOPIN) 0.5 MG tablet Take 1 tablet (0.5 mg total) by mouth 2 (two) times daily as needed for anxiety. Patient taking differently: Take 0.5 mg by mouth 2 (two) times daily. 03/16/22   [name], MD  ENTRESTO 97-103 MG Take 2 tablets by mouth daily. 04/03/22   [provider]  hydrALAZINE (APRESOLINE) 100 MG tablet Take 100 mg by mouth 3 (three) times daily. 08/05/21   [provider]  insulin lispro (HUMALOG) 100 UNIT/ML injection Inject into the skin continuous. Via Insulin Pump 01/08/22   [provider]  isosorbide mononitrate (IMDUR) 30 MG 24 hr tablet Take 30 mg by mouth in the morning and at bedtime.    [provider]  magnesium oxide (MAG-OX) 400 MG tablet Take 400 mg by mouth 2 (two) times daily. 08/03/21   [provider]  methocarbamol (ROBAXIN) 500 MG tablet Take 500 mg by mouth every 8 (eight) hours as needed for muscle spasms.    [provider]  metoCLOPramide (  REGLAN) 10 MG tablet Take 1 tablet (10 mg total) by mouth every 8 (eight) hours as needed for nausea. Patient not taking: Reported on 06/27/2022 01/17/22   Lucrezia Starch, MD  ondansetron (ZOFRAN) 4 MG tablet Take 1 tablet (4 mg total) by mouth every 6 (six) hours. Patient taking differently: Take 4 mg by mouth every 6 (six) hours as needed for nausea or vomiting. 04/11/22   [name], PA  pantoprazole (PROTONIX) 40 MG tablet Take 1 tablet (40 mg total) by mouth 2 (two) times daily before a meal. Patient  not taking: Reported on 06/27/2022 04/11/22   [name], PA  prochlorperazine (COMPAZINE) 5 MG tablet Take 1 tablet (5 mg total) by mouth every 6 (six) hours as needed for nausea or vomiting. Patient not taking: Reported on 06/27/2022 01/21/22   Regan Lemming, MD  promethazine (PHENERGAN) 25 MG suppository Place 1 suppository (25 mg total) rectally as needed for nausea or vomiting. 04/11/22   [name], PA  Vitamin D, Ergocalciferol, (DRISDOL) 1.25 MG (50000 UNIT) CAPS capsule Take 50,000 Units by mouth every Sunday.    [provider]  escitalopram (LEXAPRO) 10 MG tablet Take 20 mg by mouth daily.  05/10/20 10/06/20  [provider]  furosemide (LASIX) 40 MG tablet Take 1 tablet (40 mg total) by mouth daily. Patient not taking: No sig reported 10/28/19 02/17/21  [name], MD  lisinopril (ZESTRIL) 10 MG tablet Take 10 mg by mouth daily.  02/23/20 10/06/20  [provider]  spironolactone (ALDACTONE) 25 MG tablet Take 25 mg by mouth daily.  04/20/20 10/06/20  [provider]      Allergies    Cantaloupe extract allergy skin test, Strawberry extract, Citrullus vulgaris, and Nsaids    Review of Systems   Review of Systems  Constitutional:  Negative for fever.  Eyes:  Negative for visual disturbance.  Respiratory:  Negative for shortness of breath.   Cardiovascular:  Negative for chest pain.  Gastrointestinal:  Positive for nausea and vomiting. Negative for abdominal pain.  Genitourinary:  Negative for dysuria.  Skin:  Negative for rash.  Neurological:  Positive for weakness.    Physical Exam Updated Vital Signs BP (!) 148/113 (BP Location: Left Arm)   Pulse (!) 109   Temp 97.8 F (36.6 C) (Oral)   Resp 18   SpO2 97%  Physical Exam Vitals and nursing note reviewed.  Constitutional:      General: She is not in acute distress.    Appearance: Normal appearance. She is well-developed.  HENT:     Head: Normocephalic and  atraumatic.  Eyes:     Conjunctiva/sclera: Conjunctivae normal.  Cardiovascular:     Rate and Rhythm: Regular rhythm. Tachycardia present.     Heart sounds: No murmur heard. Pulmonary:     Effort: Pulmonary effort is normal. No respiratory distress.     Breath sounds: Normal breath sounds.  Abdominal:     Palpations: Abdomen is soft.     Tenderness: There is no abdominal tenderness. There is no guarding or rebound.  Musculoskeletal:        General: Normal range of motion.     Cervical back: Neck supple.     Right lower leg: No edema.     Left lower leg: No edema.  Skin:    General: Skin is warm and dry.     Capillary Refill: Capillary refill takes less than 2 seconds.  Neurological:     General: No  focal deficit present.     Mental Status: She is alert.     ED Results / Procedures / Treatments   Labs (all labs ordered are listed, but only abnormal results are displayed) Labs Reviewed  CBG MONITORING, ED - Abnormal; Notable for the following components:      Result Value   Glucose-Capillary 388 (*)    All other components within normal limits  BASIC METABOLIC PANEL  URINALYSIS, ROUTINE W REFLEX MICROSCOPIC  CBC WITH DIFFERENTIAL/PLATELET  BLOOD GAS, VENOUS  BETA-HYDROXYBUTYRIC ACID  I-STAT BETA HCG BLOOD, ED ([facility], [facility], [facility] ONLY)  CBG MONITORING, ED  I-STAT CHEM 8, ED    EKG EKG Interpretation  Date/Time:  Tuesday June 27 2022 16:37:30 EDT Ventricular Rate:  107 PR Interval:  148 QRS Duration: 99 QT Interval:  398 QTC Calculation: 531 R Axis:   32 Text Interpretation: Sinus tachycardia Left atrial enlargement Borderline Q waves in lateral leads Nonspecific T abnormalities, lateral leads Prolonged QT interval similar to prior yesterday Confirmed by Aletta Edouard [phone]) on 06/27/2022 4:44:40 PM  Radiology DG CHEST PORT 1 VIEW  Result Date: 06/26/2022 CLINICAL DATA:  DKA EXAM: PORTABLE CHEST 1 VIEW COMPARISON:  Chest x-ray 03/06/2022.  Chest CT 11/22/2021.  FINDINGS: Marked cardiomegaly is unchanged. There is central pulmonary vascular congestion. There is no focal lung consolidation, pleural effusion, or pneumothorax. No acute fractures are seen. IMPRESSION: Marked cardiomegaly with central pulmonary vascular congestion. Electronically Signed   By: Ronney Asters M.D.   On: 06/26/2022 23:38    Procedures Procedures  {Document cardiac monitor, telemetry assessment procedure when appropriate:1}  Medications Ordered in ED Medications  lactated ringers bolus 1,000 mL (has no administration in time range)    ED Course/ Medical Decision Making/ A&P                           Medical Decision Making Amount and/or Complexity of Data Reviewed Labs: ordered.   This patient complains of ***; this involves an extensive number of treatment Options and is a complaint that carries with it a high risk of complications and morbidity. The differential includes ***  I ordered, reviewed and interpreted labs, which included *** I ordered medication *** and reviewed PMP when indicated. I ordered imaging studies which included *** and I independently    visualized and interpreted imaging which showed *** Additional history obtained from *** Previous records obtained and reviewed *** I consulted *** and discussed lab and imaging findings and discussed disposition.  Cardiac monitoring reviewed, *** Social determinants considered, *** Critical Interventions: ***  After the interventions stated above, I reevaluated the patient and found *** Admission and further testing considered, ***   {Document critical care time when appropriate:1} {Document review of labs and clinical decision tools ie heart score, Chads2Vasc2 etc:1}  {Document your independent review of radiology images, and any outside records:1} {Document your discussion with family members, caretakers, and with consultants:1} {Document social determinants of health affecting pt's  care:1} {Document your decision making why or why not admission, treatments were needed:1} Final Clinical Impression(s) / ED Diagnoses Final diagnoses:  None    Rx / DC Orders ED Discharge Orders     None

## 2022-06-27 NOTE — Assessment & Plan Note (Signed)
-   Dr. Augustin Coupe was notified about the patient and will attempt to the patient at [facility] for peritoneal dialysis tonight specially given vascular congestion.Marland Kitchen

## 2022-06-27 NOTE — ED Provider Triage Note (Signed)
Emergency Medicine Provider Triage Evaluation Note  Royale Lennartz , a 26 y.o. female  was evaluated in triage.  Pt complains of elevated blood sugar. Reports that CGM and insulin pump are not operational. Last dialysis 06/25/22.  Review of Systems  Positive: Nausea and vomiting Negative: Shortness of breath, fever, chills  Physical Exam  BP (!) 148/113 (BP Location: Left Arm)   Pulse (!) 109   Temp 97.8 F (36.6 C) (Oral)   Resp 18   SpO2 97%  Gen:   Awake, no distress   Resp:  Normal effort  MSK:   Moves extremities without difficulty  Other:  Abdomen soft  Medical Decision Making  Medically screening exam initiated at 4:08 PM.  Appropriate orders placed.  Chiniqua Kilcrease was informed that the remainder of the evaluation will be completed by another provider, this initial triage assessment does not replace that evaluation, and the importance of remaining in the ED until their evaluation is complete.     Etta Quill, NP 06/27/22 [phone]

## 2022-06-27 NOTE — Assessment & Plan Note (Signed)
-   We will continue her Klonopin. 

## 2022-06-27 NOTE — ED Notes (Signed)
Gave Chem 8 to RN and MD

## 2022-06-28 ENCOUNTER — Inpatient Hospital Stay (HOSPITAL_COMMUNITY): Payer: Medicaid Other

## 2022-06-28 DIAGNOSIS — F3181 Bipolar II disorder: Secondary | ICD-10-CM | POA: Diagnosis not present

## 2022-06-28 DIAGNOSIS — N186 End stage renal disease: Secondary | ICD-10-CM | POA: Diagnosis not present

## 2022-06-28 DIAGNOSIS — E1010 Type 1 diabetes mellitus with ketoacidosis without coma: Secondary | ICD-10-CM | POA: Diagnosis not present

## 2022-06-28 DIAGNOSIS — E1110 Type 2 diabetes mellitus with ketoacidosis without coma: Secondary | ICD-10-CM | POA: Diagnosis present

## 2022-06-28 DIAGNOSIS — I160 Hypertensive urgency: Secondary | ICD-10-CM | POA: Diagnosis not present

## 2022-06-28 LAB — CBG MONITORING, ED
Glucose-Capillary: 102 mg/dL — ABNORMAL HIGH (ref 70–99)
Glucose-Capillary: 138 mg/dL — ABNORMAL HIGH (ref 70–99)
Glucose-Capillary: 138 mg/dL — ABNORMAL HIGH (ref 70–99)
Glucose-Capillary: 157 mg/dL — ABNORMAL HIGH (ref 70–99)
Glucose-Capillary: 161 mg/dL — ABNORMAL HIGH (ref 70–99)
Glucose-Capillary: 168 mg/dL — ABNORMAL HIGH (ref 70–99)
Glucose-Capillary: 175 mg/dL — ABNORMAL HIGH (ref 70–99)
Glucose-Capillary: 254 mg/dL — ABNORMAL HIGH (ref 70–99)

## 2022-06-28 LAB — BETA-HYDROXYBUTYRIC ACID
Beta-Hydroxybutyric Acid: 0.16 mmol/L (ref 0.05–0.27)
Beta-Hydroxybutyric Acid: 0.31 mmol/L — ABNORMAL HIGH (ref 0.05–0.27)

## 2022-06-28 LAB — BASIC METABOLIC PANEL
Anion gap: 19 — ABNORMAL HIGH (ref 5–15)
Anion gap: 24 — ABNORMAL HIGH (ref 5–15)
Anion gap: 26 — ABNORMAL HIGH (ref 5–15)
BUN: 100 mg/dL — ABNORMAL HIGH (ref 6–20)
BUN: 101 mg/dL — ABNORMAL HIGH (ref 6–20)
BUN: 99 mg/dL — ABNORMAL HIGH (ref 6–20)
CO2: 16 mmol/L — ABNORMAL LOW (ref 22–32)
CO2: 17 mmol/L — ABNORMAL LOW (ref 22–32)
CO2: 17 mmol/L — ABNORMAL LOW (ref 22–32)
Calcium: 7.6 mg/dL — ABNORMAL LOW (ref 8.9–10.3)
Calcium: 7.6 mg/dL — ABNORMAL LOW (ref 8.9–10.3)
Calcium: 7.9 mg/dL — ABNORMAL LOW (ref 8.9–10.3)
Chloride: 103 mmol/L (ref 98–111)
Chloride: 98 mmol/L (ref 98–111)
Chloride: 99 mmol/L (ref 98–111)
Creatinine, Ser: 12.75 mg/dL — ABNORMAL HIGH (ref 0.44–1.00)
Creatinine, Ser: 12.8 mg/dL — ABNORMAL HIGH (ref 0.44–1.00)
Creatinine, Ser: 12.92 mg/dL — ABNORMAL HIGH (ref 0.44–1.00)
GFR, Estimated: 4 mL/min — ABNORMAL LOW (ref >60)
GFR, Estimated: 4 mL/min — ABNORMAL LOW (ref >60)
GFR, Estimated: 4 mL/min — ABNORMAL LOW (ref >60)
Glucose, Bld: 121 mg/dL — ABNORMAL HIGH (ref 70–99)
Glucose, Bld: 189 mg/dL — ABNORMAL HIGH (ref 70–99)
Glucose, Bld: 226 mg/dL — ABNORMAL HIGH (ref 70–99)
Potassium: 4.6 mmol/L (ref 3.5–5.1)
Potassium: 5.2 mmol/L — ABNORMAL HIGH (ref 3.5–5.1)
Potassium: 5.3 mmol/L — ABNORMAL HIGH (ref 3.5–5.1)
Sodium: 139 mmol/L (ref 135–145)
Sodium: 139 mmol/L (ref 135–145)
Sodium: 141 mmol/L (ref 135–145)

## 2022-06-28 LAB — CBC
HCT: 37.6 % (ref 36.0–46.0)
Hemoglobin: 12.6 g/dL (ref 12.0–15.0)
MCH: 31.8 pg (ref 26.0–34.0)
MCHC: 33.5 g/dL (ref 30.0–36.0)
MCV: 94.9 fL (ref 80.0–100.0)
Platelets: 199 10*3/uL (ref 150–400)
RBC: 3.96 MIL/uL (ref 3.87–5.11)
RDW: 14.6 % (ref 11.5–15.5)
WBC: 9.4 10*3/uL (ref 4.0–10.5)
nRBC: 0.2 % (ref 0.0–0.2)

## 2022-06-28 LAB — PHOSPHORUS: Phosphorus: >30 mg/dL — ABNORMAL HIGH (ref 2.5–4.6)

## 2022-06-28 NOTE — ED Notes (Signed)
Insulin infusion stopped per endotool.  Last glucose 102

## 2022-06-28 NOTE — ED Notes (Signed)
MD Rathore made aware of BP and HR.

## 2022-06-28 NOTE — Discharge Summary (Signed)
Physician Discharge Summary  Thanvi Blincoe NLG:[id number] DOB: 09/05/1995 DOA: 06/27/2022  PCP: Patient, No Pcp Per  Admit date: 06/27/2022 Discharge date: 06/28/2022  Admitted From: Home Disposition: Home  Recommendations for Outpatient Follow-up:  Follow up with PCP in 1 week with repeat CBC/BMP Outpatient follow-up with nephrology.  Continue home PD Follow up in ED if symptoms worsen or new appear   Home Health: No Equipment/Devices: None  Discharge Condition: Stable CODE STATUS: Full Diet recommendation: Heart healthy/carb modified/renal diet  Brief/Interim Summary: 26 year old African-American female with history of diabetes mellitus type 1, recurrent DKA, end-stage renal disease on peritoneal dialysis, anemia of chronic disease, bipolar disorder, dyslipidemia, hypertension who had presented a day prior with DKA but had signed out AMA presented again with hyperglycemia and was found to be in DKA along with elevated blood pressure.  She was started on IV fluids and insulin drip.  Nephrology was consulted.  She was transferred to [facility].  Subsequently, blood sugars have normalized and insulin drip has been stopped.  Nephrology has cleared the patient for discharge to continue home peritoneal dialysis.  She will be discharged home today with outpatient follow-up with PCP.  Diabetes coordinator consult is pending.  Discharge Diagnoses:   DKA type I -Treated with IV fluids and insulin drip and subsequently insulin drip has been discontinued.  Blood sugars have much improved.  Carb modified diet. -Continue insulin pump on discharge with outpatient follow-up with PCP  End-stage renal disease on peritoneal dialysis -Nephrology has evaluated the patient and cleared the patient for discharge to continue home peritoneal dialysis -Discharge patient home.  Hypertensive urgency -Blood pressure still elevated but improving.  Resume home antihypertensives.  Nephrology has cleared  for Entresto to be resumed  Bipolar disorder -Continue Klonopin.  Outpatient follow-up with PCP  Hyperlipidemia--continue statin  Discharge Instructions  Discharge Instructions     Diet - low sodium heart healthy   Complete by: As directed    Diet Carb Modified   Complete by: As directed    Increase activity slowly   Complete by: As directed    No wound care   Complete by: As directed       Allergies as of 06/28/2022       Reactions   Cantaloupe Extract Allergy Skin Test Itching   Mouth itching   Strawberry Extract Itching   Mouth itches   Citrullus Vulgaris Itching   Makes mouth itch , ALL melons   Nsaids Other (See Comments)   Avoid per nephrology Other reaction(s): Other Avoid per nephrology Avoid per nephrology        Medication List     TAKE these medications    acetaminophen 500 MG tablet Commonly known as: TYLENOL Take 1,000 mg by mouth every 6 (six) hours as needed for headache (pain).   amitriptyline 10 MG tablet Commonly known as: ELAVIL Take 1 tablet (10 mg total) by mouth at bedtime.   atorvastatin 40 MG tablet Commonly known as: LIPITOR Take 40 mg by mouth daily.   butalbital-acetaminophen-caffeine 50-325-40 MG tablet Commonly known as: FIORICET Take 1 tablet by mouth daily as needed for headache.   calcitRIOL 0.25 MCG capsule Commonly known as: ROCALTROL Take 0.25 mcg by mouth every morning.   calcium carbonate 500 MG chewable tablet Commonly known as: TUMS - dosed in mg elemental calcium Take 2 tablets by mouth 3 (three) times daily with meals.   carvedilol 12.5 MG tablet Commonly known as: COREG Take 12.5 mg by mouth 2 (two)  times daily with a meal.   clonazePAM 0.5 MG tablet Commonly known as: KLONOPIN Take 1 tablet (0.5 mg total) by mouth 2 (two) times daily as needed for anxiety. What changed: when to take this   Entresto 97-103 MG Generic drug: sacubitril-valsartan Take 2 tablets by mouth daily.   hydrALAZINE 100 MG  tablet Commonly known as: APRESOLINE Take 100 mg by mouth 3 (three) times daily.   insulin lispro 100 UNIT/ML injection Commonly known as: HUMALOG Inject into the skin continuous. Via Insulin Pump   isosorbide mononitrate 30 MG 24 hr tablet Commonly known as: IMDUR Take 30 mg by mouth in the morning and at bedtime.   magnesium oxide 400 MG tablet Commonly known as: MAG-OX Take 400 mg by mouth 2 (two) times daily.   methocarbamol 500 MG tablet Commonly known as: ROBAXIN Take 500 mg by mouth every 8 (eight) hours as needed for muscle spasms.   metoCLOPramide 10 MG tablet Commonly known as: REGLAN Take 1 tablet (10 mg total) by mouth every 8 (eight) hours as needed for nausea.   ondansetron 4 MG tablet Commonly known as: ZOFRAN Take 1 tablet (4 mg total) by mouth every 6 (six) hours. What changed:  when to take this reasons to take this   pantoprazole 40 MG tablet Commonly known as: Protonix Take 1 tablet (40 mg total) by mouth 2 (two) times daily before a meal.   prochlorperazine 5 MG tablet Commonly known as: COMPAZINE Take 1 tablet (5 mg total) by mouth every 6 (six) hours as needed for nausea or vomiting.   promethazine 25 MG suppository Commonly known as: PHENERGAN Place 1 suppository (25 mg total) rectally as needed for nausea or vomiting.   Vitamin D (Ergocalciferol) 1.25 MG (50000 UNIT) Caps capsule Commonly known as: DRISDOL Take 50,000 Units by mouth every Sunday.        Follow-up Information     PCP. Schedule an appointment as soon as possible for a visit in 1 week(s).                 Allergies  Allergen Reactions   Cantaloupe Extract Allergy Skin Test Itching    Mouth itching     Strawberry Extract Itching    Mouth itches   Citrullus Vulgaris Itching    Makes mouth itch , ALL melons    Nsaids Other (See Comments)    Avoid per nephrology Other reaction(s): Other Avoid per nephrology Avoid per nephrology     Consultations: Nephrology   Procedures/Studies: DG CHEST PORT 1 VIEW  Result Date: 06/28/2022 CLINICAL DATA:  Hypoxia. EXAM: PORTABLE CHEST 1 VIEW COMPARISON:  06/27/2022 FINDINGS: Marked cardiac enlargement is again noted. No signs of pleural effusion or edema. No airspace opacities identified. The visualized osseous structures are unremarkable. IMPRESSION: 1. Cardiac enlargement. 2. No acute findings. Electronically Signed   By: Kerby Moors M.D.   On: 06/28/2022 06:04   DG Chest Port 1 View  Result Date: 06/27/2022 CLINICAL DATA:  Diabetic ketoacidosis. EXAM: PORTABLE CHEST 1 VIEW COMPARISON:  Chest radiograph dated 06/26/2022. FINDINGS: Cardiomegaly with vascular congestion similar or slightly worsened since the prior radiograph. No focal consolidation, pleural effusion pneumothorax. No acute osseous pathology. IMPRESSION: Cardiomegaly with vascular congestion. No focal consolidation. Electronically Signed   By: Anner Crete M.D.   On: 06/27/2022 20:05   DG CHEST PORT 1 VIEW  Result Date: 06/26/2022 CLINICAL DATA:  DKA EXAM: PORTABLE CHEST 1 VIEW COMPARISON:  Chest x-ray 03/06/2022.  Chest CT 11/22/2021.  FINDINGS: Marked cardiomegaly is unchanged. There is central pulmonary vascular congestion. There is no focal lung consolidation, pleural effusion, or pneumothorax. No acute fractures are seen. IMPRESSION: Marked cardiomegaly with central pulmonary vascular congestion. Electronically Signed   By: Ronney Asters M.D.   On: 06/26/2022 23:38      Subjective: Patient seen and examined at bedside.  Denies any current nausea, vomiting.  No chest pain, worsening shortness breath reported.  Discharge Exam: Vitals:   06/28/22 1045 06/28/22 1100  BP: (!) 142/120 (!) 130/103  Pulse: 92 93  Resp: (!) 21 16  Temp:    SpO2: 100% 99%    General: Pt is alert, awake, not in acute distress.  On room air.  Looks chronically ill and deconditioned. Cardiovascular: rate controlled, S1/S2  + Respiratory: bilateral decreased breath sounds at bases Abdominal: Soft, NT, ND, bowel sounds + Extremities: Bilateral lower extremity edema present; no cyanosis    The results of significant diagnostics from this hospitalization (including imaging, microbiology, ancillary and laboratory) are listed below for reference.     Microbiology: No results found for this or any previous visit (from the past 240 hour(s)).   Labs: BNP (last 3 results) Recent Labs    08/11/21 1143 11/22/21 1027 06/27/22 1730  BNP 1,278.5* 2,727.2* >1,638.4*   Basic Metabolic Panel: Recent Labs  Lab 06/26/22 2054 06/26/22 2102 06/27/22 1730 06/27/22 1800 06/27/22 2337 06/28/22 0425 06/28/22 0859  NA 135   < > 135 133* 139 139 141  K 5.5*   < > 5.8* 5.7* 4.6 5.3* 5.2*  CL 98   < > 101 101 98 103 99  CO2 14*  --  15*  --  17* 17* 16*  GLUCOSE 597*   < > 464* 470* 226* 189* 121*  BUN 91*   < > 99* 101* 100* 101* 99*  CREATININE 12.04*   < > 12.84* 14.20* 12.80* 12.92* 12.75*  CALCIUM 7.1*  --  7.3*  --  7.9* 7.6* 7.6*  MG  --   --  2.4  --   --   --   --   PHOS  --   --   --   --  >30.0*  --   --    < > = values in this interval not displayed.   Liver Function Tests: Recent Labs  Lab 06/27/22 1730  AST 22  ALT 18  ALKPHOS 75  BILITOT 0.9  PROT 6.5  ALBUMIN 3.2*   No results for input(s): "LIPASE", "AMYLASE" in the last 168 hours. No results for input(s): "AMMONIA" in the last 168 hours. CBC: Recent Labs  Lab 06/26/22 2054 06/26/22 2102 06/27/22 1730 06/27/22 1800 06/28/22 0425  WBC 6.0  --  9.2  --  9.4  NEUTROABS 5.5  --  8.2*  --   --   HGB 12.1 13.3 12.2 13.3 12.6  HCT 37.3 39.0 36.9 39.0 37.6  MCV 96.1  --  93.9  --  94.9  PLT 221  --  233  --  199   Cardiac Enzymes: No results for input(s): "CKTOTAL", "CKMB", "CKMBINDEX", "TROPONINI" in the last 168 hours. BNP: Invalid input(s): "POCBNP" CBG: Recent Labs  Lab 06/28/22 0309 06/28/22 0355 06/28/22 0604  06/28/22 0822 06/28/22 1047  GLUCAP 161* 168* 138* 102* 138*   D-Dimer No results for input(s): "DDIMER" in the last 72 hours. Hgb A1c No results for input(s): "HGBA1C" in the last 72 hours. Lipid Profile No results for input(s): "CHOL", "HDL", "LDLCALC", "  TRIG", "CHOLHDL", "LDLDIRECT" in the last 72 hours. Thyroid function studies No results for input(s): "TSH", "T4TOTAL", "T3FREE", "THYROIDAB" in the last 72 hours.  Invalid input(s): "FREET3" Anemia work up No results for input(s): "VITAMINB12", "FOLATE", "FERRITIN", "TIBC", "IRON", "RETICCTPCT" in the last 72 hours. Urinalysis    Component Value Date/Time   COLORURINE YELLOW 03/15/2022 1656   APPEARANCEUR CLEAR 03/15/2022 1656   LABSPEC 1.020 03/15/2022 1656   PHURINE 6.5 03/15/2022 1656   GLUCOSEU >=500 (A) 03/15/2022 1656   HGBUR SMALL (A) 03/15/2022 1656   BILIRUBINUR NEGATIVE 03/15/2022 1656   KETONESUR NEGATIVE 03/15/2022 1656   PROTEINUR >=300 (A) 03/15/2022 1656   UROBILINOGEN 0.2 12/15/2015 1736   NITRITE NEGATIVE 03/15/2022 1656   LEUKOCYTESUR TRACE (A) 03/15/2022 1656   Sepsis Labs Recent Labs  Lab 06/26/22 2054 06/27/22 1730 06/28/22 0425  WBC 6.0 9.2 9.4   Microbiology No results found for this or any previous visit (from the past 240 hour(s)).   Time coordinating discharge: 35 minutes  SIGNED:   Aline August, MD  [facility] 06/28/2022, 11:33 AM

## 2022-06-28 NOTE — Progress Notes (Signed)
Overnight progress note  Patient admitted for DKA and was placed on IV insulin and fluids.  She has end-stage renal disease on peritoneal dialysis.  Notified by RN that this morning her sats dropped to 86% on room air, now improved to 93% on 3 L O2.  Respiratory rate 20, heart rate 88, blood pressure 120/89, temperature 98 F.  -I have decreased the rate of IV fluids and stat chest x-ray ordered. -BMP pending to see if DKA is resolving

## 2022-06-28 NOTE — ED Notes (Signed)
Dialysis nurse made aware of pts arrival and order for peritoneal dialysis. Nurse states that this cannot be completed until pt is admitted upstairs.

## 2022-06-28 NOTE — Inpatient Diabetes Management (Signed)
Inpatient Diabetes Program Recommendations  AACE/ADA: New Consensus Statement on Inpatient Glycemic Control (2015)  Target Ranges:  Prepandial:   less than 140 mg/dL      Peak postprandial:   less than 180 mg/dL (1-2 hours)      Critically ill patients:  140 - 180 mg/dL   Lab Results  Component Value Date   GLUCAP 138 (H) 06/28/2022   HGBA1C 9.2 (H) 11/23/2021    Review of Glycemic Control  Diabetes history: DM type 1 Outpatient Diabetes medications: Omnipod insulin pump with dexcom CGM Current orders for Inpatient glycemic control:   Inpatient Diabetes Program Recommendations:    Spoke with pt at bedside regarding insulin pump. Pt reports seeing Dr. Chalmers Cater, Endocrinologist, for DM management. Pt reports being on insulin pump for at least 3 months. Last A1c 7.8% last week at Endocrinology visit. Pt reports no changes were made to her insulin pump at that time. Pt stated when she felt ill, she didn't charge her insulin pump PDM and did not replace her pod that delivers insulin. I discussed with her regarding sick day guidelines and needing insulin and preparing ahead. Pt states this has not occurred often with her insulin pump. Pt feels comfortable operating pump and assures me it is working appropriately.   Thanks,  Tama Headings RN, MSN, BC-ADM Inpatient Diabetes Coordinator Team Pager [phone] (8a-5p)

## 2022-06-28 NOTE — ED Notes (Signed)
DM coordinator called for pt education at MD's request prior to d/c.

## 2022-06-28 NOTE — ED Notes (Signed)
Report given to Jasmine RN

## 2022-06-28 NOTE — Progress Notes (Signed)
[facility] Progress Note   Assessment/ Plan:    OP PD: 4 exchanges overnight, no daybag, 1500 cc dwells, f/b [place] [facility]   Assessment/Plan: DKA - hx T1DM w/ intractable N/V.  Off insulin gtt, resolved ESRD - on CCPD x ~1 year and 3 mos. F/b [place] [facility] (used to live in [place]). PD tonight once transferred w/ all 1.5% fluids as she does not appear to be grossly overloaded.  Missed PD last night, OK for d/c from renal perspective to resume regular PD tonight. Hyperkalemia - resolved HTN - prob not able to take pills here, can start clonidine patch #2 if needed and scheduled IV metoprolol 5mg  q 6hrs PRN.  Renal osteodystrophy - will check a phos; she is on TUMS 2 tabs TIDM Volume - not vol depleted, appears to be relative euvolemic and certainly not overloaded. H/o HFrEF - CXR Dispo: Ok to d/c from renal perspective  Subjective:    Seen in room.  Off IV insulin.  Still in ED so missed PD last night.     Objective:   BP (!) 130/103   Pulse 93   Temp 97.8 F (36.6 C)   Resp 16   SpO2 99%   Physical Exam: Gen:NAD, lying on side CVS: RRR Resp: clear Abd: soft, nontender Ext: no LE edema ACCESS: PD cath d/c/i  Labs: BMET Recent Labs  Lab 06/26/22 2054 06/26/22 2102 06/27/22 1730 06/27/22 1800 06/27/22 2337 06/28/22 0425 06/28/22 0859  NA 135 133* 135 133* 139 139 141  K 5.5* 5.4* 5.8* 5.7* 4.6 5.3* 5.2*  CL 98 101 101 101 98 103 99  CO2 14*  --  15*  --  17* 17* 16*  GLUCOSE 597* 608* 464* 470* 226* 189* 121*  BUN 91* 81* 99* 101* 100* 101* 99*  CREATININE 12.04* 13.50* 12.84* 14.20* 12.80* 12.92* 12.75*  CALCIUM 7.1*  --  7.3*  --  7.9* 7.6* 7.6*  PHOS  --   --   --   --  >30.0*  --   --    CBC Recent Labs  Lab 06/26/22 2054 06/26/22 2102 06/27/22 1730 06/27/22 1800 06/28/22 0425  WBC 6.0  --  9.2  --  9.4  NEUTROABS 5.5  --  8.2*  --   --   HGB 12.1 13.3 12.2 13.3 12.6  HCT 37.3 39.0 36.9 39.0 37.6  MCV 96.1  --  93.9  --  94.9   PLT 221  --  233  --  199      Medications:     atorvastatin  40 mg Oral Daily   calcitRIOL  0.25 mcg Oral q morning   calcium carbonate  2 tablet Oral TID WC   carvedilol  12.5 mg Oral BID WC   clonazePAM  0.5 mg Oral BID   gentamicin cream  1 Application Topical Daily   heparin injection (subcutaneous)  5,000 Units Subcutaneous Q8H   hydrALAZINE  100 mg Oral TID   isosorbide mononitrate  30 mg Oral Daily   magnesium oxide  400 mg Oral BID   pantoprazole  40 mg Oral BID AC   [START ON 07/02/2022] Vitamin D (Ergocalciferol)  50,000 Units Oral Q Morene Crocker MD 06/28/2022, 11:21 AM

## 2022-06-29 LAB — HEMOGLOBIN A1C
Hgb A1c MFr Bld: 8.6 % — ABNORMAL HIGH (ref 4.8–5.6)
Mean Plasma Glucose: 200 mg/dL

## 2022-06-30 ENCOUNTER — Other Ambulatory Visit: Payer: Self-pay

## 2022-06-30 ENCOUNTER — Observation Stay (HOSPITAL_COMMUNITY)
Admission: EM | Admit: 2022-06-30 | Discharge: 2022-07-01 | Disposition: A | Discharge status: Home / Self Care | Payer: Medicaid Other | Attending: Internal Medicine | Admitting: Internal Medicine

## 2022-06-30 ENCOUNTER — Encounter (HOSPITAL_COMMUNITY): Payer: Self-pay | Admitting: Family Medicine

## 2022-06-30 ENCOUNTER — Observation Stay (HOSPITAL_COMMUNITY): Payer: Medicaid Other

## 2022-06-30 DIAGNOSIS — Z87891 Personal history of nicotine dependence: Secondary | ICD-10-CM | POA: Diagnosis not present

## 2022-06-30 DIAGNOSIS — R109 Unspecified abdominal pain: Secondary | ICD-10-CM

## 2022-06-30 DIAGNOSIS — E1065 Type 1 diabetes mellitus with hyperglycemia: Secondary | ICD-10-CM | POA: Diagnosis not present

## 2022-06-30 DIAGNOSIS — Z794 Long term (current) use of insulin: Secondary | ICD-10-CM | POA: Insufficient documentation

## 2022-06-30 DIAGNOSIS — Z992 Dependence on renal dialysis: Secondary | ICD-10-CM | POA: Insufficient documentation

## 2022-06-30 DIAGNOSIS — R1084 Generalized abdominal pain: Secondary | ICD-10-CM

## 2022-06-30 DIAGNOSIS — R739 Hyperglycemia, unspecified: Secondary | ICD-10-CM

## 2022-06-30 DIAGNOSIS — E1022 Type 1 diabetes mellitus with diabetic chronic kidney disease: Secondary | ICD-10-CM | POA: Insufficient documentation

## 2022-06-30 DIAGNOSIS — Z79899 Other long term (current) drug therapy: Secondary | ICD-10-CM | POA: Insufficient documentation

## 2022-06-30 DIAGNOSIS — I132 Hypertensive heart and chronic kidney disease with heart failure and with stage 5 chronic kidney disease, or end stage renal disease: Secondary | ICD-10-CM | POA: Insufficient documentation

## 2022-06-30 DIAGNOSIS — R112 Nausea with vomiting, unspecified: Principal | ICD-10-CM | POA: Insufficient documentation

## 2022-06-30 DIAGNOSIS — R110 Nausea: Secondary | ICD-10-CM

## 2022-06-30 DIAGNOSIS — E875 Hyperkalemia: Secondary | ICD-10-CM

## 2022-06-30 DIAGNOSIS — I5022 Chronic systolic (congestive) heart failure: Secondary | ICD-10-CM

## 2022-06-30 DIAGNOSIS — N186 End stage renal disease: Secondary | ICD-10-CM | POA: Diagnosis not present

## 2022-06-30 DIAGNOSIS — R9431 Abnormal electrocardiogram [ECG] [EKG]: Secondary | ICD-10-CM | POA: Diagnosis not present

## 2022-06-30 DIAGNOSIS — I4581 Long QT syndrome: Secondary | ICD-10-CM | POA: Insufficient documentation

## 2022-06-30 DIAGNOSIS — R1110 Vomiting, unspecified: Secondary | ICD-10-CM

## 2022-06-30 LAB — URINALYSIS, ROUTINE W REFLEX MICROSCOPIC
Bilirubin Urine: NEGATIVE
Glucose, UA: >=500 mg/dL — AB
Ketones, ur: NEGATIVE mg/dL
Nitrite: NEGATIVE
Protein, ur: >=300 mg/dL — AB
Specific Gravity, Urine: 1.011 (ref 1.005–1.030)
pH: 5 (ref 5.0–8.0)

## 2022-06-30 LAB — CBC WITH DIFFERENTIAL/PLATELET
Abs Immature Granulocytes: 0.01 10*3/uL (ref 0.00–0.07)
Basophils Absolute: 0 10*3/uL (ref 0.0–0.1)
Basophils Relative: 0 %
Eosinophils Absolute: 0 10*3/uL (ref 0.0–0.5)
Eosinophils Relative: 1 %
HCT: 40.4 % (ref 36.0–46.0)
Hemoglobin: 13.4 g/dL (ref 12.0–15.0)
Immature Granulocytes: 0 %
Lymphocytes Relative: 12 %
Lymphs Abs: 0.4 10*3/uL — ABNORMAL LOW (ref 0.7–4.0)
MCH: 31.5 pg (ref 26.0–34.0)
MCHC: 33.2 g/dL (ref 30.0–36.0)
MCV: 94.8 fL (ref 80.0–100.0)
Monocytes Absolute: 0.2 10*3/uL (ref 0.1–1.0)
Monocytes Relative: 6 %
Neutro Abs: 3.1 10*3/uL (ref 1.7–7.7)
Neutrophils Relative %: 81 %
Platelets: 212 10*3/uL (ref 150–400)
RBC: 4.26 MIL/uL (ref 3.87–5.11)
RDW: 14.6 % (ref 11.5–15.5)
WBC: 3.8 10*3/uL — ABNORMAL LOW (ref 4.0–10.5)
nRBC: 0 % (ref 0.0–0.2)

## 2022-06-30 LAB — CBG MONITORING, ED
Glucose-Capillary: 182 mg/dL — ABNORMAL HIGH (ref 70–99)
Glucose-Capillary: 338 mg/dL — ABNORMAL HIGH (ref 70–99)
Glucose-Capillary: 286 mg/dL — ABNORMAL HIGH (ref 70–99)

## 2022-06-30 LAB — LACTIC ACID, PLASMA: Lactic Acid, Venous: 1.4 mmol/L (ref 0.5–1.9)

## 2022-06-30 LAB — COMPREHENSIVE METABOLIC PANEL
ALT: 18 U/L (ref 0–44)
AST: 19 U/L (ref 15–41)
Albumin: 3.1 g/dL — ABNORMAL LOW (ref 3.5–5.0)
Alkaline Phosphatase: 77 U/L (ref 38–126)
Anion gap: 17 — ABNORMAL HIGH (ref 5–15)
BUN: 100 mg/dL — ABNORMAL HIGH (ref 6–20)
CO2: 20 mmol/L — ABNORMAL LOW (ref 22–32)
Calcium: 6.5 mg/dL — ABNORMAL LOW (ref 8.9–10.3)
Chloride: 95 mmol/L — ABNORMAL LOW (ref 98–111)
Creatinine, Ser: 11.18 mg/dL — ABNORMAL HIGH (ref 0.44–1.00)
GFR, Estimated: 4 mL/min — ABNORMAL LOW (ref >60)
Glucose, Bld: 265 mg/dL — ABNORMAL HIGH (ref 70–99)
Potassium: 5.8 mmol/L — ABNORMAL HIGH (ref 3.5–5.1)
Sodium: 132 mmol/L — ABNORMAL LOW (ref 135–145)
Total Bilirubin: 0.9 mg/dL (ref 0.3–1.2)
Total Protein: 6.4 g/dL — ABNORMAL LOW (ref 6.5–8.1)

## 2022-06-30 LAB — I-STAT BETA HCG BLOOD, ED (MC, WL, AP ONLY): I-stat hCG, quantitative: <5 m[IU]/mL (ref <5)

## 2022-06-30 LAB — BLOOD GAS, VENOUS
Acid-base deficit: 4.5 mmol/L — ABNORMAL HIGH (ref 0.0–2.0)
Bicarbonate: 20.3 mmol/L (ref 20.0–28.0)
O2 Saturation: 61.8 %
Patient temperature: 37.1
pCO2, Ven: 36 mmHg — ABNORMAL LOW (ref 44–60)
pH, Ven: 7.36 (ref 7.25–7.43)
pO2, Ven: 43 mmHg (ref 32–45)

## 2022-06-30 LAB — BETA-HYDROXYBUTYRIC ACID: Beta-Hydroxybutyric Acid: 1.24 mmol/L — ABNORMAL HIGH (ref 0.05–0.27)

## 2022-06-30 LAB — LIPASE, BLOOD: Lipase: 30 U/L (ref 11–51)

## 2022-06-30 MED ORDER — ISOSORBIDE MONONITRATE ER 30 MG PO TB24
30.0000 mg | ORAL_TABLET | Freq: Every day | ORAL | Status: DC
  Filled 2022-06-30: qty 1

## 2022-06-30 MED ORDER — PANTOPRAZOLE SODIUM 40 MG PO TBEC
40.0000 mg | DELAYED_RELEASE_TABLET | Freq: Two times a day (BID) | ORAL | Status: DC
  Administered 2022-07-01: 40 mg via ORAL
  Filled 2022-06-30: qty 1

## 2022-06-30 MED ORDER — CARVEDILOL 12.5 MG PO TABS
12.5000 mg | ORAL_TABLET | Freq: Two times a day (BID) | ORAL | Status: DC
  Administered 2022-07-01: 12.5 mg via ORAL
  Filled 2022-06-30: qty 1

## 2022-06-30 MED ORDER — BUTALBITAL-APAP-CAFFEINE 50-325-40 MG PO TABS: 1.0000 | ORAL_TABLET | Freq: Every day | ORAL | Status: DC | PRN

## 2022-06-30 MED ORDER — DEXTROSE IN LACTATED RINGERS 5 % IV SOLN: INTRAVENOUS | Status: DC

## 2022-06-30 MED ORDER — AMITRIPTYLINE HCL 10 MG PO TABS
10.0000 mg | ORAL_TABLET | Freq: Every day | ORAL | Status: DC
  Administered 2022-06-30: 10 mg via ORAL
  Filled 2022-06-30 (×2): qty 1

## 2022-06-30 MED ORDER — LACTATED RINGERS IV BOLUS: 20.0000 mL/kg | Freq: Once | INTRAVENOUS | Status: DC

## 2022-06-30 MED ORDER — INSULIN REGULAR(HUMAN) IN NACL 100-0.9 UT/100ML-% IV SOLN: INTRAVENOUS | Status: DC

## 2022-06-30 MED ORDER — HEPARIN SODIUM (PORCINE) 5000 UNIT/ML IJ SOLN
5000.0000 [IU] | Freq: Three times a day (TID) | INTRAMUSCULAR | Status: DC
  Administered 2022-06-30 – 2022-07-01 (×3): 5000 [IU] via SUBCUTANEOUS
  Filled 2022-06-30 (×3): qty 1

## 2022-06-30 MED ORDER — SACUBITRIL-VALSARTAN 97-103 MG PO TABS
1.0000 | ORAL_TABLET | Freq: Two times a day (BID) | ORAL | Status: DC
  Filled 2022-06-30 (×2): qty 1

## 2022-06-30 MED ORDER — DEXTROSE 50 % IV SOLN: 0.0000 mL | INTRAVENOUS | Status: DC | PRN

## 2022-06-30 MED ORDER — CALCITRIOL 0.25 MCG PO CAPS
0.2500 ug | ORAL_CAPSULE | Freq: Every morning | ORAL | Status: DC
  Filled 2022-06-30: qty 1

## 2022-06-30 MED ORDER — CALCIUM CARBONATE ANTACID 500 MG PO CHEW
2.0000 | CHEWABLE_TABLET | Freq: Three times a day (TID) | ORAL | Status: DC
  Administered 2022-07-01 (×2): 400 mg via ORAL
  Filled 2022-06-30 (×2): qty 2

## 2022-06-30 MED ORDER — SODIUM ZIRCONIUM CYCLOSILICATE 10 G PO PACK
10.0000 g | PACK | Freq: Once | ORAL | Status: AC
  Administered 2022-06-30: 10 g via ORAL
  Filled 2022-06-30: qty 1

## 2022-06-30 MED ORDER — LORAZEPAM 2 MG/ML IJ SOLN
0.5000 mg | INTRAMUSCULAR | Status: DC | PRN
  Administered 2022-06-30: 0.5 mg via INTRAVENOUS
  Filled 2022-06-30: qty 1

## 2022-06-30 MED ORDER — CLONAZEPAM 0.5 MG PO TABS
0.5000 mg | ORAL_TABLET | Freq: Two times a day (BID) | ORAL | Status: DC
  Administered 2022-06-30 – 2022-07-01 (×2): 0.5 mg via ORAL
  Filled 2022-06-30 (×2): qty 1

## 2022-06-30 MED ORDER — LACTATED RINGERS IV SOLN: INTRAVENOUS | Status: DC

## 2022-06-30 MED ORDER — METHOCARBAMOL 500 MG PO TABS: 500.0000 mg | ORAL_TABLET | Freq: Three times a day (TID) | ORAL | Status: DC | PRN

## 2022-06-30 MED ORDER — SODIUM CHLORIDE 0.9 % IV BOLUS
500.0000 mL | Freq: Once | INTRAVENOUS | Status: AC
  Administered 2022-06-30: 500 mL via INTRAVENOUS

## 2022-06-30 MED ORDER — HYDRALAZINE HCL 50 MG PO TABS
100.0000 mg | ORAL_TABLET | Freq: Three times a day (TID) | ORAL | Status: DC
  Administered 2022-06-30: 100 mg via ORAL
  Filled 2022-06-30 (×4): qty 2

## 2022-06-30 MED ORDER — INSULIN REGULAR(HUMAN) IN NACL 100-0.9 UT/100ML-% IV SOLN
INTRAVENOUS | Status: DC
  Administered 2022-06-30: 5.5 [IU]/h via INTRAVENOUS
  Filled 2022-06-30: qty 100

## 2022-06-30 MED ORDER — SODIUM CHLORIDE 0.9 % IV SOLN: INTRAVENOUS | Status: DC

## 2022-06-30 MED ORDER — ATORVASTATIN CALCIUM 40 MG PO TABS
40.0000 mg | ORAL_TABLET | Freq: Every day | ORAL | Status: DC
  Administered 2022-07-01: 40 mg via ORAL
  Filled 2022-06-30: qty 1

## 2022-06-30 MED ORDER — LACTATED RINGERS IV BOLUS: 1000.0000 mL | Freq: Once | INTRAVENOUS | Status: DC

## 2022-06-30 NOTE — ED Provider Triage Note (Signed)
Emergency Medicine Provider Triage Evaluation Note  Reisa Coppola , a 26 y.o. female  was evaluated in triage.  Pt complains of upper abdominal pain and nausea onset 2 days.  Patient notes that they were recently discharged from the ED due to DKA.  Has tried Zofran x3 today without relief of her symptoms.  Patient completes peritoneal dialysis nightly with her last dialysis treatment last night.  Denies chest pain, shortness of breath.  Review of Systems  Positive:  Negative:   Physical Exam  BP (!) 147/108   Pulse 99   Temp 98.2 F (36.8 C)   Resp 20   SpO2 99%  Gen:   Awake, no distress   Resp:  Normal effort  MSK:   Moves extremities without difficulty  Other:    Medical Decision Making  Medically screening exam initiated at 4:57 PM.  Appropriate orders placed.  Shellsea Borunda was informed that the remainder of the evaluation will be completed by another provider, this initial triage assessment does not replace that evaluation, and the importance of remaining in the ED until their evaluation is complete.  Work-up initiated   Ashwini Jago A, PA-C 06/30/22 1702

## 2022-06-30 NOTE — ED Triage Notes (Addendum)
Pt reports upper abd pain  and nausea w/o radiation since discharge for DKA 2 days ago. Zofran w/o relief x3 today. Cbg-107 with EMS.  Pt reports peritoneal dialysis daily

## 2022-06-30 NOTE — ED Provider Notes (Signed)
[facility] DEPT Provider Note   CSN: [id number] Arrival date & time: 06/30/22  1624     History  Chief Complaint  Patient presents with   Nausea   Abdominal Pain    Kathy Lewis is a 26 y.o. female with uncontrolled type 1 diabetes on insulin with history of DKA now with insulin pump in place, chronic systolic congestive heart failure, prolonged QT interval, bipolar 2, HTN, history of PE, ESRD on peritoneal dialysis, presents with nausea, abdominal pain.   Pt reports upper abd pain and nausea w/o radiation since discharge for DKA 2 days ago. Zofran w/o relief x3 today. Cbg-107 with EMS.  Pt reports peritoneal dialysis daily.   Per chart review patient was recently admitted from 06/27/2022 to 06/28/2022 for DKA and was discharged after having presented on 06/26/2022 for DKA and leaving AMA. She states that she felt somewhat better after discharge but then got worse again. She states she has been doing peritoneal dialysis daily as usual. Denies f/c, CP, SOB, diarrhea/constipation, melena/hematochezia, urinary or vaginal symptoms. Has been using her insulin pump. States the readings have been in the 200s. Has been unable to charge the insulin pump and is concerned she is back in DKA. Having normal BMs.    Abdominal Pain      Home Medications Prior to Admission medications   Medication Sig Start Date End Date Taking? Authorizing Provider  acetaminophen (TYLENOL) 500 MG tablet Take 1,000 mg by mouth every 6 (six) hours as needed for headache (pain).    [provider]  amitriptyline (ELAVIL) 10 MG tablet Take 1 tablet (10 mg total) by mouth at bedtime. 05/25/22   [name], MD  atorvastatin (LIPITOR) 40 MG tablet Take 40 mg by mouth daily.    [provider]  butalbital-acetaminophen-caffeine (FIORICET) 50-325-40 MG tablet Take 1 tablet by mouth daily as needed for headache.    [provider]  calcitRIOL (ROCALTROL)  0.25 MCG capsule Take 0.25 mcg by mouth every morning. 08/03/21   [provider]  calcium carbonate (TUMS - DOSED IN MG ELEMENTAL CALCIUM) 500 MG chewable tablet Take 2 tablets by mouth 3 (three) times daily with meals.    [provider]  carvedilol (COREG) 12.5 MG tablet Take 12.5 mg by mouth 2 (two) times daily with a meal.    [provider]  clonazePAM (KLONOPIN) 0.5 MG tablet Take 1 tablet (0.5 mg total) by mouth 2 (two) times daily as needed for anxiety. Patient taking differently: Take 0.5 mg by mouth 2 (two) times daily. 03/16/22   [name], MD  ENTRESTO 97-103 MG Take 2 tablets by mouth daily. 04/03/22   [provider]  hydrALAZINE (APRESOLINE) 100 MG tablet Take 100 mg by mouth 3 (three) times daily. 08/05/21   [provider]  insulin lispro (HUMALOG) 100 UNIT/ML injection Inject into the skin continuous. Via Insulin Pump 01/08/22   [provider]  isosorbide mononitrate (IMDUR) 30 MG 24 hr tablet Take 30 mg by mouth in the morning and at bedtime.    [provider]  magnesium oxide (MAG-OX) 400 MG tablet Take 400 mg by mouth 2 (two) times daily. 08/03/21   [provider]  methocarbamol (ROBAXIN) 500 MG tablet Take 500 mg by mouth every 8 (eight) hours as needed for muscle spasms.    [provider]  metoCLOPramide (REGLAN) 10 MG tablet Take 1 tablet (10 mg total) by mouth every 8 (eight) hours as needed for  nausea. 01/17/22   Lucrezia Starch, MD  ondansetron (ZOFRAN) 4 MG tablet Take 1 tablet (4 mg total) by mouth every 6 (six) hours. Patient taking differently: Take 4 mg by mouth every 6 (six) hours as needed for nausea or vomiting. 04/11/22   [name], PA  pantoprazole (PROTONIX) 40 MG tablet Take 1 tablet (40 mg total) by mouth 2 (two) times daily before a meal. 04/11/22   Lemmon, Lavone Nian, PA  prochlorperazine (COMPAZINE) 5 MG tablet Take 1 tablet (5 mg total) by mouth every 6  (six) hours as needed for nausea or vomiting. 01/21/22   Regan Lemming, MD  promethazine (PHENERGAN) 25 MG suppository Place 1 suppository (25 mg total) rectally as needed for nausea or vomiting. 04/11/22   [name], PA  Vitamin D, Ergocalciferol, (DRISDOL) 1.25 MG (50000 UNIT) CAPS capsule Take 50,000 Units by mouth every Sunday.    [provider]  escitalopram (LEXAPRO) 10 MG tablet Take 20 mg by mouth daily.  05/10/20 10/06/20  [provider]  furosemide (LASIX) 40 MG tablet Take 1 tablet (40 mg total) by mouth daily. Patient not taking: No sig reported 10/28/19 02/17/21  [name], MD  lisinopril (ZESTRIL) 10 MG tablet Take 10 mg by mouth daily.  02/23/20 10/06/20  [provider]  spironolactone (ALDACTONE) 25 MG tablet Take 25 mg by mouth daily.  04/20/20 10/06/20  [provider]      Allergies    Cantaloupe extract allergy skin test, Strawberry extract, Citrullus vulgaris, and Nsaids    Review of Systems   Review of Systems  Gastrointestinal:  Positive for abdominal pain.   Review of systems negative for f/c.  A 10 point review of systems was performed and is negative unless otherwise reported in HPI.  Physical Exam Updated Vital Signs BP (!) 147/108   Pulse 99   Temp 98.2 F (36.8 C)   Resp 20   SpO2 99%  Physical Exam General: Uncomfortable appearing female, lying in bed.  HEENT: Sclera anicteric, MMM, trachea midline. Cardiology: RRR, no murmurs/rubs/gallops. BL radial and DP pulses equal bilaterally.  Resp: Normal respiratory rate and effort. CTAB, no wheezes, rhonchi, crackles.  Abd: TTP in epigastric region, mildly diffusely. Soft non-distended. No rebound tenderness or guarding.  GU: Deferred. MSK: No peripheral edema or signs of trauma. Extremities without deformity or TTP. No cyanosis or clubbing. Skin: warm, dry. No rashes or lesions. Back: No CVA tenderness Neuro: A&Ox4, CNs II-XII grossly intact. MAEs. Sensation  grossly intact.  Psych: Normal mood and affect.   ED Results / Procedures / Treatments   Labs (all labs ordered are listed, but only abnormal results are displayed) Labs Reviewed  COMPREHENSIVE METABOLIC PANEL - Abnormal; Notable for the following components:      Result Value   Sodium 132 (*)    Potassium 5.8 (*)    Chloride 95 (*)    CO2 20 (*)    Glucose, Bld 265 (*)    BUN 100 (*)    Creatinine, Ser 11.18 (*)    Calcium 6.5 (*)    Total Protein 6.4 (*)    Albumin 3.1 (*)    GFR, Estimated 4 (*)    Anion gap 17 (*)    All other components within normal limits  CBC WITH DIFFERENTIAL/PLATELET - Abnormal; Notable for the following components:   WBC 3.8 (*)    Lymphs Abs 0.4 (*)    All other components within normal limits  BETA-HYDROXYBUTYRIC ACID -  Abnormal; Notable for the following components:   Beta-Hydroxybutyric Acid 1.24 (*)    All other components within normal limits  CBG MONITORING, ED - Abnormal; Notable for the following components:   Glucose-Capillary 182 (*)    All other components within normal limits  URINALYSIS, ROUTINE W REFLEX MICROSCOPIC    EKG EKG Interpretation  Date/Time:  Friday June 30 2022 21:47:08 EDT Ventricular Rate:  95 PR Interval:  143 QRS Duration: 115 QT Interval:  458 QTC Calculation: 576 R Axis:   -79 Text Interpretation: Sinus rhythm Probable left atrial enlargement Incomplete RBBB and LAFB When compared with ECG of 06/27/2022, Arm lead reversal has been corrected Confirmed by Delora Fuel ([id number]) on 07/01/2022 4:00:52 AM  Radiology CT abd pelvis: pending  Procedures Procedures    Medications Ordered in ED Medications  sodium chloride 0.9 % bolus 500 mL (0 mLs Intravenous Stopped 06/30/22 2348)  sodium zirconium cyclosilicate (LOKELMA) packet 10 g (10 g Oral Given 06/30/22 2211)  sodium zirconium cyclosilicate (LOKELMA) packet 10 g (10 g Oral Given 07/01/22 1437)    ED Course/ Medical Decision Making/ A&P                           Medical Decision Making Amount and/or Complexity of Data Reviewed Labs: ordered.   Patient is afebrile, HDS, overall uncomfortable but non-toxic appearing.   Greatest concern for DKA given patient's recent history and stating she feels like she is in DKA. Insulin pump hasn't been charged appropriately but readings were in the 200s which isn't terribly high. Also consider other causes of abd pain/n/v such as pancreatitis given epigastric pain, or cholelithiasis/cholecystitis/biliary colic. Belly was not bloated, having normal BMs, lower c/f SBO/ileus.  Consider electrolyte abnormalities, renal injury, dehydration. Consider gastritis, gastroenteritis/colitis, or gastroparesis given known T1DM,   EKG w/ no signs of ischemia but a QTc of 576 ms.  Blood work notable for glucose 265, potassium 5.8, bicarbonate 20, anion gap 17, and WBC 3800. Lipase 30. Urinalysis notable for glucosuria, proteinuria, but no ketones.  Lactate 1.4. Patient does not appear to be in DKA at this time. However, will start IV insulin for her hyperglycemia and T1DM. D/w hospitalist, and given that her picture is not clear for DKA, will obtain CT A/P to further evaluate causes of her abd pain. Low c/f mesenteric ischemia with normal lactate.   Patient was given IV fluids and started on insulin infusion.   I have personally reviewed and interpreted all labs. Patient is consulted to medicine for admission for hyperglycemia, T1DM, mild AGMA, abd pain, N/V. Marland Kitchen  Dispo: Admit            Final Clinical Impression(s) / ED Diagnoses Final diagnoses:  Hyperglycemia  Nausea  Generalized abdominal pain  Hyperkalemia    Rx / DC Orders ED Discharge Orders     None        This note was created using dictation software, which may contain spelling or grammatical errors.    Audley Hose, MD 07/10/22 1902

## 2022-06-30 NOTE — H&P (Signed)
History and Physical    Avri Paiva LAG:[id number] DOB: 1996/08/13 DOA: 06/30/2022  PCP: System, Provider Not In   Patient coming from: Home   Chief Complaint: Nausea, vomiting, abdominal pain, dry mouth  HPI: Macil Crady is a 26 y.o. female with medical history significant for ESRD on PD, type 1 diabetes mellitus, hypertension, bipolar disorder, chronic abdominal pain with nausea and vomiting, and chronic systolic CHF who presents with abdominal pain, nausea, vomiting, and dry mouth.  Patient reports that her symptoms had improved during the recent admission and she did go home on 06/28/2022, but the symptoms went to worsen again shortly after returning home.  Abdominal pain is primarily localized to the epigastrium vomit has been nonbloody.  She denies fevers or chills associated with this.  Additionally, she reports that she is unable to charge her insulin pump due to a power outage, developed dry mouth and worsening nausea, and is concerned she is going back into DKA.  ED Course: Upon arrival to the ED, patient is found to be afebrile and saturating well on room air with stable blood pressure.  EKG features a QTc of 576 ms.  Blood work notable for glucose 265, potassium 5.8, bicarbonate 20, anion gap 17, and WBC 3800.  Urinalysis notable for glucosuria, proteinuria, but no ketones.  Patient was given IV fluids and started on insulin infusion in the ED.  CT of the abdomen and pelvis was ordered but not yet performed.  Review of Systems:  All other systems reviewed and apart from HPI, are negative.  Past Medical History:  Diagnosis Date   Anemia    Anxiety    Bipolar 2 disorder (HCC)    Chronic kidney disease    Chronic systolic (congestive) heart failure (HCC)    Depression    DKA (diabetic ketoacidoses)    ESRD on peritoneal dialysis ([place])    HSV infection    on valtrex   Hypokalemia    Leukocytosis    Migraine    Noncompliance with medication regimen    Preeclampsia     Severe anemia    Type 1 diabetes mellitus ([place])     Past Surgical History:  Procedure Laterality Date   BIOPSY  04/24/2022   Procedure: BIOPSY;  Surgeon: [name], MD;  Location: [facility];  Service: Gastroenterology;;   CARDIAC CATHETERIZATION     COLONOSCOPY WITH PROPOFOL N/A 04/24/2022   Procedure: COLONOSCOPY WITH PROPOFOL;  Surgeon: [name], MD;  Location: [facility];  Service: Gastroenterology;  Laterality: N/A;   DILATION AND EVACUATION N/A 10/22/2019   Procedure: ULTRASOUND GUIDED DILATATION AND EVACUATION;  Surgeon: [name], MD;  [facility];  Service: Gynecology;  Laterality: N/A;   ESOPHAGOGASTRODUODENOSCOPY (EGD) WITH PROPOFOL N/A 04/24/2022   Procedure: ESOPHAGOGASTRODUODENOSCOPY (EGD) WITH PROPOFOL;  Surgeon: [name], MD;  Location: [facility];  Service: Gastroenterology;  Laterality: N/A;   NO PAST SURGERIES     peritoneal dialysis catheter insertion     RENAL BIOPSY      Social History:   reports that she has quit smoking. Her smoking use included cigars and cigarettes. She has a 2.00 pack-year smoking history. She has never used smokeless tobacco. She reports that she does not drink alcohol and does not use drugs.  Allergies  Allergen Reactions   Cantaloupe Extract Allergy Skin Test Itching    Mouth itching     Strawberry Extract Itching    Mouth itches   Citrullus Vulgaris Itching  Makes mouth itch , ALL melons    Nsaids Other (See Comments)    Avoid per nephrology Other reaction(s): Other Avoid per nephrology Avoid per nephrology    Family History  Adopted: Yes  Problem Relation Age of Onset   Heart disease Neg Hx      Prior to Admission medications   Medication Sig Start Date End Date Taking? Authorizing Provider  acetaminophen (TYLENOL) 500 MG tablet Take 1,000 mg by mouth every 6 (six) hours as needed for headache (pain).   Yes [provider]  amitriptyline (ELAVIL) 10 MG tablet  Take 1 tablet (10 mg total) by mouth at bedtime. 05/25/22  Yes [name], MD  atorvastatin (LIPITOR) 40 MG tablet Take 40 mg by mouth daily.   Yes [provider]  butalbital-acetaminophen-caffeine (FIORICET) 50-325-40 MG tablet Take 1 tablet by mouth daily as needed for headache.   Yes [provider]  calcitRIOL (ROCALTROL) 0.25 MCG capsule Take 0.25 mcg by mouth every morning. 08/03/21  Yes [provider]  calcium carbonate (TUMS - DOSED IN MG ELEMENTAL CALCIUM) 500 MG chewable tablet Take 2 tablets by mouth 3 (three) times daily with meals.   Yes [provider]  carvedilol (COREG) 12.5 MG tablet Take 12.5 mg by mouth 2 (two) times daily with a meal.   Yes [provider]  clonazePAM (KLONOPIN) 0.5 MG tablet Take 1 tablet (0.5 mg total) by mouth 2 (two) times daily as needed for anxiety. Patient taking differently: Take 0.5 mg by mouth 2 (two) times daily. 03/16/22  Yes [name], MD  ENTRESTO 97-103 MG Take 2 tablets by mouth daily. 04/03/22  Yes [provider]  hydrALAZINE (APRESOLINE) 100 MG tablet Take 100 mg by mouth 3 (three) times daily. 08/05/21  Yes [provider]  insulin lispro (HUMALOG) 100 UNIT/ML injection Inject 0.5 Units into the skin See admin instructions. Via Insulin Pump - 1/2 unit per hour 01/08/22  Yes [provider]  isosorbide mononitrate (IMDUR) 30 MG 24 hr tablet Take 30 mg by mouth in the morning and at bedtime.   Yes [provider]  magnesium oxide (MAG-OX) 400 MG tablet Take 400 mg by mouth 2 (two) times daily. 08/03/21  Yes [provider]  methocarbamol (ROBAXIN) 500 MG tablet Take 500 mg by mouth every 8 (eight) hours as needed for muscle spasms.   Yes [provider]  ondansetron (ZOFRAN) 4 MG tablet Take 1 tablet (4 mg total) by mouth every 6 (six) hours. Patient taking differently: Take 4 mg by mouth every 6 (six) hours as needed for nausea or  vomiting. 04/11/22  Yes [name], PA  pantoprazole (PROTONIX) 40 MG tablet Take 1 tablet (40 mg total) by mouth 2 (two) times daily before a meal. 04/11/22  Yes Lemmon, Lavone Nian, PA  Vitamin D, Ergocalciferol, (DRISDOL) 1.25 MG (50000 UNIT) CAPS capsule Take 50,000 Units by mouth every Sunday.   Yes [provider]  metoCLOPramide (REGLAN) 10 MG tablet Take 1 tablet (10 mg total) by mouth every 8 (eight) hours as needed for nausea. Patient not taking: Reported on 06/30/2022 01/17/22   Lucrezia Starch, MD  prochlorperazine (COMPAZINE) 5 MG tablet Take 1 tablet (5 mg total) by mouth every 6 (six) hours as needed for nausea or vomiting. Patient not taking: Reported on 06/30/2022 01/21/22   Regan Lemming, MD  promethazine (PHENERGAN) 25 MG suppository Place 1 suppository (25 mg total) rectally as needed for nausea or vomiting.  Patient not taking: Reported on 06/30/2022 04/11/22   [name], PA  escitalopram (LEXAPRO) 10 MG tablet Take 20 mg by mouth daily.  05/10/20 10/06/20  [provider]  furosemide (LASIX) 40 MG tablet Take 1 tablet (40 mg total) by mouth daily. Patient not taking: No sig reported 10/28/19 02/17/21  [name], MD  lisinopril (ZESTRIL) 10 MG tablet Take 10 mg by mouth daily.  02/23/20 10/06/20  [provider]  spironolactone (ALDACTONE) 25 MG tablet Take 25 mg by mouth daily.  04/20/20 10/06/20  [provider]    Physical Exam: Vitals:   06/30/22 1947 06/30/22 2030 06/30/22 2045 06/30/22 2130  BP: (!) 126/92 121/84  121/83  Pulse: 95 97  95  Resp: 18 18  16   Temp:   98.2 F (36.8 C)   SpO2: 100% 100%  100%    Constitutional: NAD, no pallor or diaphoresis   Eyes: PERTLA, lids and conjunctivae normal ENMT: Mucous membranes are dry. Posterior pharynx clear of any exudate or lesions.   Neck: supple, no masses  Respiratory: no wheezing, no crackles. No accessory muscle use.  Cardiovascular: S1 & S2 heard, regular  rate and rhythm. No extremity edema.  Abdomen: soft, tender in epigastrium, no rebound pain or guarding. Bowel sounds hypoactive.  Musculoskeletal: no clubbing / cyanosis. No joint deformity upper and lower extremities.   Skin: no significant rashes, lesions, ulcers. Warm, dry, well-perfused. Neurologic: CN 2-12 grossly intact. Moving all extremities. Alert and oriented.  Psychiatric: Calm. Cooperative.    Labs and Imaging on Admission: I have personally reviewed following labs and imaging studies  CBC: Recent Labs  Lab 06/26/22 2054 06/26/22 2102 06/27/22 1730 06/27/22 1800 06/28/22 0425 06/30/22 1659  WBC 6.0  --  9.2  --  9.4 3.8*  NEUTROABS 5.5  --  8.2*  --   --  3.1  HGB 12.1 13.3 12.2 13.3 12.6 13.4  HCT 37.3 39.0 36.9 39.0 37.6 40.4  MCV 96.1  --  93.9  --  94.9 94.8  PLT 221  --  233  --  199 169   Basic Metabolic Panel: Recent Labs  Lab 06/27/22 1730 06/27/22 1800 06/27/22 2337 06/28/22 0425 06/28/22 0859 06/30/22 1659  NA 135 133* 139 139 141 132*  K 5.8* 5.7* 4.6 5.3* 5.2* 5.8*  CL 101 101 98 103 99 95*  CO2 15*  --  17* 17* 16* 20*  GLUCOSE 464* 470* 226* 189* 121* 265*  BUN 99* 101* 100* 101* 99* 100*  CREATININE 12.84* 14.20* 12.80* 12.92* 12.75* 11.18*  CALCIUM 7.3*  --  7.9* 7.6* 7.6* 6.5*  MG 2.4  --   --   --   --   --   PHOS  --   --  >30.0*  --   --   --    GFR: Estimated Creatinine Clearance: 6.1 mL/min (A) (by C-G formula based on SCr of 11.18 mg/dL (H)). Liver Function Tests: Recent Labs  Lab 06/27/22 1730 06/30/22 1659  AST 22 19  ALT 18 18  ALKPHOS 75 77  BILITOT 0.9 0.9  PROT 6.5 6.4*  ALBUMIN 3.2* 3.1*   No results for input(s): "LIPASE", "AMYLASE" in the last 168 hours. No results for input(s): "AMMONIA" in the last 168 hours. Coagulation Profile: No results for input(s): "INR", "PROTIME" in the last 168 hours. Cardiac Enzymes: No results for input(s): "CKTOTAL", "CKMB", "CKMBINDEX", "TROPONINI" in the last 168 hours. BNP  (last 3 results) No results for input(s): "  PROBNP" in the last 8760 hours. HbA1C: Recent Labs    06/27/22 2337  HGBA1C 8.6*   CBG: Recent Labs  Lab 06/28/22 0604 06/28/22 0822 06/28/22 1047 06/30/22 1733 06/30/22 2115  GLUCAP 138* 102* 138* 182* 338*   Lipid Profile: No results for input(s): "CHOL", "HDL", "LDLCALC", "TRIG", "CHOLHDL", "LDLDIRECT" in the last 72 hours. Thyroid Function Tests: No results for input(s): "TSH", "T4TOTAL", "FREET4", "T3FREE", "THYROIDAB" in the last 72 hours. Anemia Panel: No results for input(s): "VITAMINB12", "FOLATE", "FERRITIN", "TIBC", "IRON", "RETICCTPCT" in the last 72 hours. Urine analysis:    Component Value Date/Time   COLORURINE YELLOW 06/30/2022 1702   APPEARANCEUR HAZY (A) 06/30/2022 1702   LABSPEC 1.011 06/30/2022 1702   PHURINE 5.0 06/30/2022 1702   GLUCOSEU >=500 (A) 06/30/2022 1702   HGBUR SMALL (A) 06/30/2022 1702   BILIRUBINUR NEGATIVE 06/30/2022 1702   KETONESUR NEGATIVE 06/30/2022 1702   PROTEINUR >=300 (A) 06/30/2022 1702   UROBILINOGEN 0.2 12/15/2015 1736   NITRITE NEGATIVE 06/30/2022 1702   LEUKOCYTESUR SMALL (A) 06/30/2022 1702   Sepsis Labs: @LABRCNTIP (procalcitonin:4,lacticidven:4) )No results found for this or any previous visit (from the past 240 hour(s)).   Radiological Exams on Admission: No results found.  EKG: Independently reviewed. SR, incomplete RBBB and LAFB, QTc 576 ms.   Assessment/Plan   1. Type I DM with hyperglycemia  - Patient unable to charge her insulin pump d/t power outage and presents with abd pain, N/V, and dry mouth  - She is hyperglycemic with mildly depressed serum bicarb, elevated AG, but no urine ketones  - Start insulin infusion with frequent CBGs, transition back to insulin pump when AG closed    2. Abdominal pain with N/V  - Acute on chronic upper abdominal pain with N/V not improved with antiemetics at home  - Check CT abd/pelvis, continue supportive care    3. ESRD;  hyperkalemia  - Reports completing PD night of 06/29/22  - Serum potassium is 5.8 on admission in setting of ESRD and insulin deficiency  - Check EKG, hold Entresto, continue insulin infusion, give a dose of Lokelma, repeat chem panel     4. Chronic systolic CHF  - EF was 82% on TTE from March 2023  - Appears compensated on admission  - Restrict fluids, continue Coreg, hold Entresto in light of hyperkalemia, monitor volume status    5. Depression, anxiety  - Continue amitriptyline and Klonopin   6. Prolonged QT interval  - Check magnesium level, avoid QT-prolonging medications    DVT prophylaxis: sq heparin  Code Status: Full  Level of Care: Level of care: Progressive Family Communication: None present  Disposition Plan:  Patient is from: home  Anticipated d/c is to: Home  Anticipated d/c date is: 11/4 or 07/02/22  Patient currently: Pending tolerance of adequate oral intake, glycemic-control  Consults called: none  Admission status: observation     Vianne Bulls, MD [facility]  06/30/2022, 9:50 PM

## 2022-07-01 LAB — CBC
HCT: 36.8 % (ref 36.0–46.0)
Hemoglobin: 12 g/dL (ref 12.0–15.0)
MCH: 30.8 pg (ref 26.0–34.0)
MCHC: 32.6 g/dL (ref 30.0–36.0)
MCV: 94.4 fL (ref 80.0–100.0)
Platelets: 208 10*3/uL (ref 150–400)
RBC: 3.9 MIL/uL (ref 3.87–5.11)
RDW: 14.7 % (ref 11.5–15.5)
WBC: 4.1 10*3/uL (ref 4.0–10.5)
nRBC: 0 % (ref 0.0–0.2)

## 2022-07-01 LAB — BETA-HYDROXYBUTYRIC ACID: Beta-Hydroxybutyric Acid: 2.12 mmol/L — ABNORMAL HIGH (ref 0.05–0.27)

## 2022-07-01 LAB — BASIC METABOLIC PANEL
Anion gap: 16 — ABNORMAL HIGH (ref 5–15)
Anion gap: 15 (ref 5–15)
BUN: 106 mg/dL — ABNORMAL HIGH (ref 6–20)
BUN: 100 mg/dL — ABNORMAL HIGH (ref 6–20)
CO2: 18 mmol/L — ABNORMAL LOW (ref 22–32)
CO2: 17 mmol/L — ABNORMAL LOW (ref 22–32)
Calcium: 6.2 mg/dL — CL (ref 8.9–10.3)
Calcium: 5.4 mg/dL — CL (ref 8.9–10.3)
Chloride: 98 mmol/L (ref 98–111)
Chloride: 98 mmol/L (ref 98–111)
Creatinine, Ser: 13.82 mg/dL — ABNORMAL HIGH (ref 0.44–1.00)
Creatinine, Ser: 12.15 mg/dL — ABNORMAL HIGH (ref 0.44–1.00)
GFR, Estimated: 3 mL/min — ABNORMAL LOW (ref >60)
GFR, Estimated: 4 mL/min — ABNORMAL LOW (ref >60)
Glucose, Bld: 126 mg/dL — ABNORMAL HIGH (ref 70–99)
Glucose, Bld: 400 mg/dL — ABNORMAL HIGH (ref 70–99)
Potassium: 5.4 mmol/L — ABNORMAL HIGH (ref 3.5–5.1)
Potassium: 4.5 mmol/L (ref 3.5–5.1)
Sodium: 132 mmol/L — ABNORMAL LOW (ref 135–145)
Sodium: 130 mmol/L — ABNORMAL LOW (ref 135–145)

## 2022-07-01 LAB — CBG MONITORING, ED
Glucose-Capillary: 116 mg/dL — ABNORMAL HIGH (ref 70–99)
Glucose-Capillary: 119 mg/dL — ABNORMAL HIGH (ref 70–99)
Glucose-Capillary: 120 mg/dL — ABNORMAL HIGH (ref 70–99)
Glucose-Capillary: 128 mg/dL — ABNORMAL HIGH (ref 70–99)
Glucose-Capillary: 129 mg/dL — ABNORMAL HIGH (ref 70–99)
Glucose-Capillary: 144 mg/dL — ABNORMAL HIGH (ref 70–99)
Glucose-Capillary: 160 mg/dL — ABNORMAL HIGH (ref 70–99)
Glucose-Capillary: 169 mg/dL — ABNORMAL HIGH (ref 70–99)
Glucose-Capillary: 232 mg/dL — ABNORMAL HIGH (ref 70–99)
Glucose-Capillary: 87 mg/dL (ref 70–99)

## 2022-07-01 MED ORDER — DEXTROSE 5 % IV SOLN: INTRAVENOUS | Status: DC

## 2022-07-01 MED ORDER — DEXTROSE 10 % IV SOLN: INTRAVENOUS | Status: DC

## 2022-07-01 MED ORDER — SODIUM ZIRCONIUM CYCLOSILICATE 10 G PO PACK
10.0000 g | PACK | Freq: Once | ORAL | Status: AC
  Administered 2022-07-01: 10 g via ORAL
  Filled 2022-07-01: qty 1

## 2022-07-01 NOTE — ED Notes (Signed)
[facility] at bedside, pt refused to be transported to [facility]. Stated, "my blood sugar's good, my insulin pump is back working. I don't need to stay in the hospital anymore."

## 2022-07-01 NOTE — ED Notes (Signed)
Diabetes coordinator speaking with pt via phone call at this time.

## 2022-07-01 NOTE — Inpatient Diabetes Management (Signed)
Inpatient Diabetes Program Recommendations  AACE/ADA: New Consensus Statement on Inpatient Glycemic Control (2015)  Target Ranges:  Prepandial:   less than 140 mg/dL      Peak postprandial:   less than 180 mg/dL (1-2 hours)      Critically ill patients:  140 - 180 mg/dL   Lab Results  Component Value Date   GLUCAP 120 (H) 07/01/2022   HGBA1C 8.6 (H) 06/27/2022    Latest Reference Range & Units 06/30/22 16:59  Sodium 135 - 145 mmol/L 132 (L)  Potassium 3.5 - 5.1 mmol/L 5.8 (H)  Chloride 98 - 111 mmol/L 95 (L)  CO2 22 - 32 mmol/L 20 (L)  Glucose 70 - 99 mg/dL 265 (H)  BUN 6 - 20 mg/dL 100 (H)  Creatinine 0.44 - 1.00 mg/dL 11.18 (H)  Calcium 8.9 - 10.3 mg/dL 6.5 (L)  Anion gap 5 - 15  17 (H)  (L): Data is abnormally low (H): Data is abnormally high  Review of Glycemic Control  Diabetes history: DM type 1 Outpatient Diabetes medications: Omnipod insulin pump with dexcom CGM Current orders for Inpatient glycemic control:  Current orders for Inpatient glycemic control: IV insulin  Inpatient Diabetes Program Recommendations:   Noted patient discharged home on 06-28-22 (CO2 16 and anion gap 26 06-28-22 @ 0859 am). When patient ready for transition to subcutaneous insulin: Please order  -DKA order set labs if not ordered -Semglee 10 units Q24H (give 2 hrs. Prior to IV insulin discontinued -If NPO, Novolog 0-6 units q 4 hrs.(Give correction when IV insulin discontinued. And if eating, Novolog 0-6 units tid correction + 0-5 units hs -Novolog 3 units TID with meals for meal coverage if patient eats at least 50% of meals.    Dr. Chalmers Cater and that her total basal is 0.5 units/hour (total of 12 units/day), insulin to carb ratio is 1:8 grams carbs, and insulin sensitivity is 1:50 mg/dl (1 unit drops glucose 50 mg/dl).   Patient well known to DM coordinators. Pt has had 10 ED visits and 4 inpatient visits over the past 6 months. Attempted to call pt. Without answer, attempted to reach pt. Via  phone through ED phone lines, but no answer. Spoke with RN Aggie Moats if pt has insulin pump with her @ the hospital. Spoke with patient. Patient discharged home on 06-28-22 and was unable to get insulin pods due to her pharmacy had a power outage. Patient had humalog insulin available and Administered herself Humalog 10 units @ a time and was eating very little. Reviewed with patient to notify her endocrinologist next time she is unable to get insulin pump supplies and needs to be on basal insulin if unable to restart insulin pump. Patient verbalized understanding. Patient has someone able to pick up her insulin pump supplies so she can have @ home when she is discharged. Explained if receives basal insulin here, will need to wait 24 hrs. After to restart insulin pump. Updated Dr. Karleen Hampshire via [place].  Thank you, Nani Gasser. Joann Kulpa, RN, MSN, CDE  Diabetes Coordinator Inpatient Glycemic Control Team Team Pager [phone] (8am-5pm) 07/01/2022 8:57 AM

## 2022-07-06 ENCOUNTER — Other Ambulatory Visit (HOSPITAL_BASED_OUTPATIENT_CLINIC_OR_DEPARTMENT_OTHER): Payer: Self-pay

## 2022-07-06 ENCOUNTER — Telehealth (HOSPITAL_BASED_OUTPATIENT_CLINIC_OR_DEPARTMENT_OTHER): Payer: Self-pay | Admitting: Emergency Medicine

## 2022-07-06 ENCOUNTER — Other Ambulatory Visit: Payer: Self-pay

## 2022-07-06 ENCOUNTER — Encounter (HOSPITAL_BASED_OUTPATIENT_CLINIC_OR_DEPARTMENT_OTHER): Payer: Self-pay | Admitting: Emergency Medicine

## 2022-07-06 ENCOUNTER — Emergency Department (HOSPITAL_BASED_OUTPATIENT_CLINIC_OR_DEPARTMENT_OTHER)
Admission: EM | Admit: 2022-07-06 | Discharge: 2022-07-06 | Disposition: A | Discharge status: Home / Self Care | Payer: Medicaid Other | Attending: Emergency Medicine | Admitting: Emergency Medicine

## 2022-07-06 DIAGNOSIS — N189 Chronic kidney disease, unspecified: Secondary | ICD-10-CM | POA: Diagnosis not present

## 2022-07-06 DIAGNOSIS — Z1152 Encounter for screening for COVID-19: Secondary | ICD-10-CM | POA: Insufficient documentation

## 2022-07-06 DIAGNOSIS — E1022 Type 1 diabetes mellitus with diabetic chronic kidney disease: Secondary | ICD-10-CM | POA: Insufficient documentation

## 2022-07-06 DIAGNOSIS — I5022 Chronic systolic (congestive) heart failure: Secondary | ICD-10-CM | POA: Diagnosis not present

## 2022-07-06 DIAGNOSIS — R0602 Shortness of breath: Secondary | ICD-10-CM | POA: Diagnosis present

## 2022-07-06 DIAGNOSIS — J209 Acute bronchitis, unspecified: Secondary | ICD-10-CM | POA: Diagnosis not present

## 2022-07-06 DIAGNOSIS — Z794 Long term (current) use of insulin: Secondary | ICD-10-CM | POA: Diagnosis not present

## 2022-07-06 DIAGNOSIS — Z87891 Personal history of nicotine dependence: Secondary | ICD-10-CM | POA: Diagnosis not present

## 2022-07-06 HISTORY — DX: Long QT syndrome: I45.81

## 2022-07-06 LAB — RESP PANEL BY RT-PCR (FLU A&B, COVID) ARPGX2
Influenza A by PCR: NEGATIVE
Influenza B by PCR: NEGATIVE
SARS Coronavirus 2 by RT PCR: NEGATIVE

## 2022-07-06 MED ORDER — HYDROCOD POLI-CHLORPHE POLI ER 10-8 MG/5ML PO SUER: 5.0000 mL | Freq: Two times a day (BID) | ORAL | 0 refills | Status: DC | PRN

## 2022-07-06 MED ORDER — HYDROCOD POLI-CHLORPHE POLI ER 10-8 MG/5ML PO SUER
5.0000 mL | Freq: Two times a day (BID) | ORAL | 0 refills | Status: DC | PRN
  Filled 2022-07-06: qty 70, 7d supply, fill #0

## 2022-07-06 MED ORDER — FLUTICASONE PROPIONATE HFA 44 MCG/ACT IN AERO
1.0000 | INHALATION_SPRAY | Freq: Two times a day (BID) | RESPIRATORY_TRACT | Status: DC
  Administered 2022-07-06: 1 via RESPIRATORY_TRACT
  Filled 2022-07-06: qty 10.6

## 2022-07-06 MED ORDER — HYDROCODONE BIT-HOMATROP MBR 5-1.5 MG/5ML PO SOLN
5.0000 mL | Freq: Four times a day (QID) | ORAL | 0 refills | Status: DC | PRN
  Filled 2022-07-06: qty 120, 6d supply, fill #0

## 2022-07-06 MED ORDER — DEXAMETHASONE SODIUM PHOSPHATE 10 MG/ML IJ SOLN
10.0000 mg | Freq: Once | INTRAMUSCULAR | Status: AC
  Administered 2022-07-06: 10 mg via INTRAMUSCULAR
  Filled 2022-07-06: qty 1

## 2022-07-06 MED ORDER — HYDROCOD POLI-CHLORPHE POLI ER 10-8 MG/5ML PO SUER
5.0000 mL | Freq: Once | ORAL | Status: AC
  Administered 2022-07-06: 5 mL via ORAL
  Filled 2022-07-06: qty 5

## 2022-07-06 NOTE — ED Provider Notes (Signed)
[facility] Provider Note: Kathy Spurling, MD, FACEP  CSN: [id number] MRN: [id number] ARRIVAL: 07/06/22 at [place]: [place]  Shortness of Breath   HISTORY OF PRESENT ILLNESS  07/06/22 2:38 AM Kathy Lewis is a 26 y.o. female with nasal congestion, cough and shortness of breath for the past week.  There was recent family exposure to RSV.  She is also having discomfort in her chest which she rates as a 4 out of 10, worse with coughing.  She denies fever.  She has been wheezing as well.  She is not have an inhaler.   Past Medical History:  Diagnosis Date   Anemia    Anxiety    Bipolar 2 disorder (HCC)    Chronic kidney disease    Chronic systolic (congestive) heart failure (HCC)    Depression    DKA (diabetic ketoacidoses)    ESRD on peritoneal dialysis ([place])    HSV infection    on valtrex   Hypokalemia    Leukocytosis    Migraine    Noncompliance with medication regimen    Preeclampsia    Severe anemia    Type 1 diabetes mellitus ([place])     Past Surgical History:  Procedure Laterality Date   BIOPSY  04/24/2022   Procedure: BIOPSY;  Surgeon: [name], MD;  Location: [facility];  Service: Gastroenterology;;   CARDIAC CATHETERIZATION     COLONOSCOPY WITH PROPOFOL N/A 04/24/2022   Procedure: COLONOSCOPY WITH PROPOFOL;  Surgeon: [name], MD;  Location: [facility];  Service: Gastroenterology;  Laterality: N/A;   DILATION AND EVACUATION N/A 10/22/2019   Procedure: ULTRASOUND GUIDED DILATATION AND EVACUATION;  Surgeon: [name], MD;  [facility];  Service: Gynecology;  Laterality: N/A;   ESOPHAGOGASTRODUODENOSCOPY (EGD) WITH PROPOFOL N/A 04/24/2022   Procedure: ESOPHAGOGASTRODUODENOSCOPY (EGD) WITH PROPOFOL;  Surgeon: [name], MD;  Location: [place];  Service: Gastroenterology;  Laterality: N/A;   NO PAST SURGERIES     peritoneal dialysis catheter insertion     RENAL BIOPSY       Family History  Adopted: Yes  Problem Relation Age of Onset   Heart disease Neg Hx     Social History   Tobacco Use   Smoking status: Former    Packs/day: 1.00    Years: 2.00    Total pack years: 2.00    Types: Cigars, Cigarettes   Smokeless tobacco: Never  Vaping Use   Vaping Use: Never used  Substance Use Topics   Alcohol use: No   Drug use: No    Prior to Admission medications   Medication Sig Start Date End Date Taking? Authorizing Provider  chlorpheniramine-HYDROcodone (TUSSIONEX) 10-8 MG/5ML Take 5 mLs by mouth every 12 (twelve) hours as needed for cough. 07/06/22  Yes [name], MD  acetaminophen (TYLENOL) 500 MG tablet Take 1,000 mg by mouth every 6 (six) hours as needed for headache (pain).    [provider]  amitriptyline (ELAVIL) 10 MG tablet Take 1 tablet (10 mg total) by mouth at bedtime. 05/25/22   [name], MD  atorvastatin (LIPITOR) 40 MG tablet Take 40 mg by mouth daily.    [provider]  butalbital-acetaminophen-caffeine (FIORICET) 50-325-40 MG tablet Take 1 tablet by mouth daily as needed for headache.    [provider]  calcitRIOL (ROCALTROL) 0.25 MCG capsule Take 0.25 mcg by mouth every morning. 08/03/21   [provider]  calcium carbonate (TUMS - DOSED  IN MG ELEMENTAL CALCIUM) 500 MG chewable tablet Take 2 tablets by mouth 3 (three) times daily with meals.    [provider]  carvedilol (COREG) 12.5 MG tablet Take 12.5 mg by mouth 2 (two) times daily with a meal.    [provider]  clonazePAM (KLONOPIN) 0.5 MG tablet Take 1 tablet (0.5 mg total) by mouth 2 (two) times daily as needed for anxiety. Patient taking differently: Take 0.5 mg by mouth 2 (two) times daily. 03/16/22   [name], MD  ENTRESTO 97-103 MG Take 2 tablets by mouth daily. 04/03/22   [provider]  hydrALAZINE (APRESOLINE) 100 MG tablet Take 100 mg by mouth 3 (three) times daily. 08/05/21   [provider]  insulin lispro (HUMALOG) 100 UNIT/ML injection Inject 0.5 Units into the skin See admin instructions. Via Insulin Pump - 1/2 unit per hour 01/08/22   [provider]  isosorbide mononitrate (IMDUR) 30 MG 24 hr tablet Take 30 mg by mouth in the morning and at bedtime.    [provider]  magnesium oxide (MAG-OX) 400 MG tablet Take 400 mg by mouth 2 (two) times daily. 08/03/21   [provider]  methocarbamol (ROBAXIN) 500 MG tablet Take 500 mg by mouth every 8 (eight) hours as needed for muscle spasms.    [provider]  ondansetron (ZOFRAN) 4 MG tablet Take 1 tablet (4 mg total) by mouth every 6 (six) hours. Patient taking differently: Take 4 mg by mouth every 6 (six) hours as needed for nausea or vomiting. 04/11/22   [name], PA  pantoprazole (PROTONIX) 40 MG tablet Take 1 tablet (40 mg total) by mouth 2 (two) times daily before a meal. 04/11/22   Lemmon, Lavone Nian, PA  Vitamin D, Ergocalciferol, (DRISDOL) 1.25 MG (50000 UNIT) CAPS capsule Take 50,000 Units by mouth every Sunday.    [provider]  escitalopram (LEXAPRO) 10 MG tablet Take 20 mg by mouth daily.  05/10/20 10/06/20  [provider]  furosemide (LASIX) 40 MG tablet Take 1 tablet (40 mg total) by mouth daily. Patient not taking: No sig reported 10/28/19 02/17/21  [name], MD  lisinopril (ZESTRIL) 10 MG tablet Take 10 mg by mouth daily.  02/23/20 10/06/20  [provider]  spironolactone (ALDACTONE) 25 MG tablet Take 25 mg by mouth daily.  04/20/20 10/06/20  [provider]    Allergies Cantaloupe extract allergy skin test, Strawberry extract, Citrullus vulgaris, and Nsaids   REVIEW OF SYSTEMS  Negative except as noted here or in the History of Present Illness.   PHYSICAL EXAMINATION  Initial Vital Signs Blood pressure (!) 143/109, pulse (!) 122, temperature 99.1 F (37.3 C), temperature source Oral, resp. rate 20, height 5'  (1.524 m), weight 59 kg, SpO2 94 %.  Examination General: Well-developed, well-nourished female in no acute distress; appearance consistent with age of record HENT: normocephalic; atraumatic Eyes: Normal appearance Neck: supple Heart: regular rate and rhythm Lungs: Mild coarse sounds bilaterally with shallow breaths Abdomen: soft; nondistended; nontender; bowel sounds present Extremities: No deformity; full range of motion Neurologic: Awake, alert and oriented; motor function intact in all extremities and symmetric; no facial droop Skin: Warm and dry Psychiatric: Normal mood and affect   RESULTS  Summary of this visit's results, reviewed and interpreted by myself:   EKG Interpretation  Date/Time:    Ventricular Rate:    PR Interval:    QRS Duration:   QT Interval:    QTC  Calculation:   R Axis:     Text Interpretation:         Laboratory Studies: Results for orders placed or performed during the hospital encounter of 07/06/22 (from the past 24 hour(s))  Resp Panel by RT-PCR (Flu A&B, Covid) Anterior Nasal Swab     Status: None   Collection Time: 07/06/22  1:48 AM   Specimen: Anterior Nasal Swab  Result Value Ref Range   SARS Coronavirus 2 by RT PCR NEGATIVE NEGATIVE   Influenza A by PCR NEGATIVE NEGATIVE   Influenza B by PCR NEGATIVE NEGATIVE   Imaging Studies: No results found.  ED COURSE and MDM  Nursing notes, initial and subsequent vitals signs, including pulse oximetry, reviewed and interpreted by myself.  Vitals:   07/06/22 0129 07/06/22 0130 07/06/22 0217 07/06/22 0218  BP: (!) 147/106  (!) 143/109   Pulse: (!) 121  (!) 122 (!) 122  Resp: (!) 22  20   Temp: 99.1 F (37.3 C)     TempSrc: Oral     SpO2: 98%  94% 94%  Weight:  59 kg    Height:  5' (1.524 m)     Medications  dexamethasone (DECADRON) injection 10 mg (has no administration in time range)  fluticasone (FLOVENT HFA) 44 MCG/ACT inhaler 1 puff (has no administration in time range)   chlorpheniramine-HYDROcodone (TUSSIONEX) 10-8 MG/5ML suspension 5 mL (has no administration in time range)    Presentation consistent with acute viral bronchitis.  This may represent RSV as she has had recent exposure to RSV.  She is negative for COVID and influenza.  She has prolonged QT syndrome with a QTc of 576 on most recent EKG.  This makes use of albuterol contraindicated.  We will provide a steroid inhaler instead.   PROCEDURES  Procedures   ED DIAGNOSES     ICD-10-CM   1. Acute bronchitis with bronchospasm  J20.9          Meenakshi Sazama, MD 07/06/22 [phone]

## 2022-07-06 NOTE — Telephone Encounter (Signed)
The patient's pharmacy does not stock the medication that was prescribed.  Requesting that it be sent to our pharmacy.

## 2022-07-06 NOTE — ED Triage Notes (Signed)
Cough and SOB X 1 week, worse tonight. Pt family had RSV and bronchitis.

## 2022-07-08 NOTE — Discharge Summary (Signed)
Physician Discharge Summary   Patient: Kathy Lewis MRN: [id number] DOB: 08/05/1996  Admit date:     06/30/2022  Discharge date: 07/01/2022  Discharge Physician: Hosie Poisson   PCP: System, Provider Not In   Patient left AMA.  Discharge Diagnoses: Principal Problem:   Uncontrolled type 1 diabetes mellitus with hyperglycemia, with long-term current use of insulin (HCC) Active Problems:   Nausea & vomiting   Chronic systolic CHF (congestive heart failure) (HCC)   Prolonged QT interval   Abdominal pain with vomiting   Hyperkalemia    Hospital Course: No notes on fileTamera Lewis is a 26 y.o. female with medical history significant for ESRD on PD, type 1 diabetes mellitus, hypertension, bipolar disorder, chronic abdominal pain with nausea and vomiting, and chronic systolic CHF who presents with abdominal pain, nausea, vomiting, and dry mouth.   Patient reports that her symptoms had improved during the recent admission and she did go home on 06/28/2022, but the symptoms went to worsen again shortly after returning home.  Abdominal pain is primarily localized to the epigastrium vomit has been nonbloody.  She denies fevers or chills associated with this.  Additionally, she reports that she is unable to charge her insulin pump due to a power outage, developed dry mouth and worsening nausea, and is concerned she is going back into DKA.  Upon arrival to the ED, patient is found to be afebrile and saturating well on room air with stable blood pressure.  EKG features a QTc of 576 ms.  Blood work notable for glucose 265, potassium 5.8, bicarbonate 20, anion gap 17, and WBC 3800.  Urinalysis notable for glucosuria, proteinuria, but no ketones.  Patient was given IV fluids and started on insulin infusion in the ED.  CT of the abdomen and pelvis was ordered.   Assessment and Plan:  Patient left AMA.        DISCHARGE MEDICATION: Allergies as of 07/01/2022       Reactions   Cantaloupe Extract  Allergy Skin Test Itching   Mouth itching   Strawberry Extract Itching   Mouth itches   Citrullus Vulgaris Itching   Makes mouth itch , ALL melons   Nsaids Other (See Comments)   Avoid per nephrology Other reaction(s): Other Avoid per nephrology Avoid per nephrology        Medication List     ASK your doctor about these medications    acetaminophen 500 MG tablet Commonly known as: TYLENOL Take 1,000 mg by mouth every 6 (six) hours as needed for headache (pain).   amitriptyline 10 MG tablet Commonly known as: ELAVIL Take 1 tablet (10 mg total) by mouth at bedtime.   atorvastatin 40 MG tablet Commonly known as: LIPITOR Take 40 mg by mouth daily.   butalbital-acetaminophen-caffeine 50-325-40 MG tablet Commonly known as: FIORICET Take 1 tablet by mouth daily as needed for headache.   calcitRIOL 0.25 MCG capsule Commonly known as: ROCALTROL Take 0.25 mcg by mouth every morning.   calcium carbonate 500 MG chewable tablet Commonly known as: TUMS - dosed in mg elemental calcium Take 2 tablets by mouth 3 (three) times daily with meals.   carvedilol 12.5 MG tablet Commonly known as: COREG Take 12.5 mg by mouth 2 (two) times daily with a meal.   clonazePAM 0.5 MG tablet Commonly known as: KLONOPIN Take 1 tablet (0.5 mg total) by mouth 2 (two) times daily as needed for anxiety.   Entresto 97-103 MG Generic drug: sacubitril-valsartan Take 2 tablets by mouth daily.  hydrALAZINE 100 MG tablet Commonly known as: APRESOLINE Take 100 mg by mouth 3 (three) times daily.   insulin lispro 100 UNIT/ML injection Commonly known as: HUMALOG Inject 0.5 Units into the skin See admin instructions. Via Insulin Pump - 1/2 unit per hour   isosorbide mononitrate 30 MG 24 hr tablet Commonly known as: IMDUR Take 30 mg by mouth in the morning and at bedtime.   magnesium oxide 400 MG tablet Commonly known as: MAG-OX Take 400 mg by mouth 2 (two) times daily.   methocarbamol 500 MG  tablet Commonly known as: ROBAXIN Take 500 mg by mouth every 8 (eight) hours as needed for muscle spasms.   ondansetron 4 MG tablet Commonly known as: ZOFRAN Take 1 tablet (4 mg total) by mouth every 6 (six) hours.   pantoprazole 40 MG tablet Commonly known as: Protonix Take 1 tablet (40 mg total) by mouth 2 (two) times daily before a meal.   Vitamin D (Ergocalciferol) 1.25 MG (50000 UNIT) Caps capsule Commonly known as: DRISDOL Take 50,000 Units by mouth every Sunday.          The results of significant diagnostics from this hospitalization (including imaging, microbiology, ancillary and laboratory) are listed below for reference.   Imaging Studies: CT ABDOMEN PELVIS WO CONTRAST  Result Date: 06/30/2022 CLINICAL DATA:  Abdominal pain with nausea and vomiting EXAM: CT ABDOMEN AND PELVIS WITHOUT CONTRAST TECHNIQUE: Multidetector CT imaging of the abdomen and pelvis was performed following the standard protocol without IV contrast. RADIATION DOSE REDUCTION: This exam was performed according to the departmental dose-optimization program which includes automated exposure control, adjustment of the mA and/or kV according to patient size and/or use of iterative reconstruction technique. COMPARISON:  CT 04/26/2022 FINDINGS: Lower chest: Cardiomegaly. Moderate pericardial effusion, unchanged. Left basilar atelectasis/scarring. Mild interlobular septal thickening likely due to edema. Hepatobiliary: Hepatomegaly. No gallstones, gallbladder wall thickening, or biliary dilation. Pancreas: Unremarkable. No pancreatic ductal dilatation or surrounding inflammatory changes. Spleen: Normal in size without focal abnormality. Adrenals/Urinary Tract: Unremarkable adrenal glands. Normal noncontrast appearance of the kidneys. No urinary calculi or hydronephrosis. Unremarkable bladder. Peritoneal dialysis catheter tip in the left lower quadrant. Stomach/Bowel: Dense material outlines the rugal folds in the  stomach. Stomach is otherwise unremarkable. Normal caliber large and small bowel. Normal appendix. Vascular/Lymphatic: No significant vascular findings are present. No enlarged abdominal or pelvic lymph nodes. Reproductive: Uterus and bilateral adnexa are unremarkable. Other: Small volume abdominopelvic ascites. No free intraperitoneal air. Musculoskeletal: No acute osseous abnormality.  Body wall edema. IMPRESSION: No acute abnormality in the abdomen or pelvis. Hepatomegaly. Peritoneal dialysis catheter tip in the left lower quadrant. Mild abdominopelvic ascites. Body wall anasarca. Cardiomegaly and moderate pericardial effusion unchanged from 04/26/2022. Mild interstitial edema in the lower lungs. Electronically Signed   By: Placido Sou M.D.   On: 06/30/2022 22:55   DG CHEST PORT 1 VIEW  Result Date: 06/28/2022 CLINICAL DATA:  Hypoxia. EXAM: PORTABLE CHEST 1 VIEW COMPARISON:  06/27/2022 FINDINGS: Marked cardiac enlargement is again noted. No signs of pleural effusion or edema. No airspace opacities identified. The visualized osseous structures are unremarkable. IMPRESSION: 1. Cardiac enlargement. 2. No acute findings. Electronically Signed   By: Kerby Moors M.D.   On: 06/28/2022 06:04   DG Chest Port 1 View  Result Date: 06/27/2022 CLINICAL DATA:  Diabetic ketoacidosis. EXAM: PORTABLE CHEST 1 VIEW COMPARISON:  Chest radiograph dated 06/26/2022. FINDINGS: Cardiomegaly with vascular congestion similar or slightly worsened since the prior radiograph. No focal consolidation, pleural effusion pneumothorax.  No acute osseous pathology. IMPRESSION: Cardiomegaly with vascular congestion. No focal consolidation. Electronically Signed   By: Anner Crete M.D.   On: 06/27/2022 20:05   DG CHEST PORT 1 VIEW  Result Date: 06/26/2022 CLINICAL DATA:  DKA EXAM: PORTABLE CHEST 1 VIEW COMPARISON:  Chest x-ray 03/06/2022.  Chest CT 11/22/2021. FINDINGS: Marked cardiomegaly is unchanged. There is central  pulmonary vascular congestion. There is no focal lung consolidation, pleural effusion, or pneumothorax. No acute fractures are seen. IMPRESSION: Marked cardiomegaly with central pulmonary vascular congestion. Electronically Signed   By: Ronney Asters M.D.   On: 06/26/2022 23:38    Microbiology: Results for orders placed or performed during the hospital encounter of 03/15/22  Urine Culture     Status: Abnormal   Collection Time: 03/15/22  6:54 PM   Specimen: Urine, Clean Catch  Result Value Ref Range Status   Specimen Description   Final    URINE, CLEAN CATCH Performed at [facility], [address] [place], [place] [address]    Special Requests   Final    NONE Performed at [facility], [address] [place], [place] [address]    Culture MULTIPLE SPECIES PRESENT, SUGGEST RECOLLECTION (A)  Final   Report Status 03/16/2022 FINAL  Final    Labs: CBC: Recent Labs  Lab 07/01/22 0500  WBC 4.1  HGB 12.0  HCT 36.8  MCV 94.4  PLT 035   Basic Metabolic Panel: Recent Labs  Lab 07/01/22 0500 07/01/22 1602  NA 132* 130*  K 5.4* 4.5  CL 98 98  CO2 18* 17*  GLUCOSE 126* 400*  BUN 106* 100*  CREATININE 13.82* 12.15*  CALCIUM 6.2* 5.4*   Liver Function Tests: No results for input(s): "AST", "ALT", "ALKPHOS", "BILITOT", "PROT", "ALBUMIN" in the last 168 hours. CBG: Recent Labs  Lab 07/01/22 0510 07/01/22 0632 07/01/22 0750 07/01/22 1003 07/01/22 1322  GLUCAP 119* 129* 120* 87 169*    Discharge time spent:22 minutes.   Signed: Hosie Poisson, MD [facility]

## 2022-07-18 ENCOUNTER — Ambulatory Visit: Payer: Medicaid Other | Admitting: Gastroenterology

## 2022-07-28 ENCOUNTER — Observation Stay (HOSPITAL_BASED_OUTPATIENT_CLINIC_OR_DEPARTMENT_OTHER)
Admission: EM | Admit: 2022-07-28 | Discharge: 2022-07-29 | Disposition: A | Discharge status: Home / Self Care | Payer: Medicaid Other | Attending: Internal Medicine | Admitting: Internal Medicine

## 2022-07-28 ENCOUNTER — Other Ambulatory Visit: Payer: Self-pay

## 2022-07-28 ENCOUNTER — Encounter (HOSPITAL_BASED_OUTPATIENT_CLINIC_OR_DEPARTMENT_OTHER): Payer: Self-pay | Admitting: Emergency Medicine

## 2022-07-28 ENCOUNTER — Encounter (HOSPITAL_COMMUNITY): Payer: Self-pay

## 2022-07-28 ENCOUNTER — Emergency Department (HOSPITAL_BASED_OUTPATIENT_CLINIC_OR_DEPARTMENT_OTHER): Payer: Medicaid Other

## 2022-07-28 DIAGNOSIS — E782 Mixed hyperlipidemia: Secondary | ICD-10-CM | POA: Insufficient documentation

## 2022-07-28 DIAGNOSIS — Z794 Long term (current) use of insulin: Secondary | ICD-10-CM | POA: Insufficient documentation

## 2022-07-28 DIAGNOSIS — Z79899 Other long term (current) drug therapy: Secondary | ICD-10-CM | POA: Diagnosis not present

## 2022-07-28 DIAGNOSIS — E8729 Other acidosis: Secondary | ICD-10-CM

## 2022-07-28 DIAGNOSIS — I3139 Other pericardial effusion (noninflammatory): Secondary | ICD-10-CM | POA: Diagnosis not present

## 2022-07-28 DIAGNOSIS — I132 Hypertensive heart and chronic kidney disease with heart failure and with stage 5 chronic kidney disease, or end stage renal disease: Secondary | ICD-10-CM | POA: Diagnosis not present

## 2022-07-28 DIAGNOSIS — E1065 Type 1 diabetes mellitus with hyperglycemia: Secondary | ICD-10-CM | POA: Diagnosis not present

## 2022-07-28 DIAGNOSIS — E1022 Type 1 diabetes mellitus with diabetic chronic kidney disease: Secondary | ICD-10-CM | POA: Insufficient documentation

## 2022-07-28 DIAGNOSIS — I5022 Chronic systolic (congestive) heart failure: Secondary | ICD-10-CM | POA: Diagnosis not present

## 2022-07-28 DIAGNOSIS — K3184 Gastroparesis: Secondary | ICD-10-CM | POA: Diagnosis not present

## 2022-07-28 DIAGNOSIS — I10 Essential (primary) hypertension: Secondary | ICD-10-CM | POA: Diagnosis present

## 2022-07-28 DIAGNOSIS — Z1152 Encounter for screening for COVID-19: Secondary | ICD-10-CM | POA: Diagnosis not present

## 2022-07-28 DIAGNOSIS — E875 Hyperkalemia: Principal | ICD-10-CM | POA: Diagnosis present

## 2022-07-28 DIAGNOSIS — N186 End stage renal disease: Secondary | ICD-10-CM

## 2022-07-28 DIAGNOSIS — R9431 Abnormal electrocardiogram [ECG] [EKG]: Secondary | ICD-10-CM | POA: Diagnosis present

## 2022-07-28 DIAGNOSIS — E872 Acidosis, unspecified: Secondary | ICD-10-CM | POA: Insufficient documentation

## 2022-07-28 DIAGNOSIS — R7989 Other specified abnormal findings of blood chemistry: Secondary | ICD-10-CM

## 2022-07-28 DIAGNOSIS — R112 Nausea with vomiting, unspecified: Principal | ICD-10-CM | POA: Diagnosis present

## 2022-07-28 DIAGNOSIS — E1043 Type 1 diabetes mellitus with diabetic autonomic (poly)neuropathy: Secondary | ICD-10-CM | POA: Insufficient documentation

## 2022-07-28 DIAGNOSIS — E1069 Type 1 diabetes mellitus with other specified complication: Secondary | ICD-10-CM | POA: Diagnosis present

## 2022-07-28 LAB — URINALYSIS, ROUTINE W REFLEX MICROSCOPIC
Bilirubin Urine: NEGATIVE
Glucose, UA: NEGATIVE mg/dL
Ketones, ur: NEGATIVE mg/dL
Leukocytes,Ua: NEGATIVE
Nitrite: NEGATIVE
Protein, ur: >=300 mg/dL — AB
Specific Gravity, Urine: 1.025 (ref 1.005–1.030)
pH: 5.5 (ref 5.0–8.0)

## 2022-07-28 LAB — CBC
HCT: 38.5 % (ref 36.0–46.0)
Hemoglobin: 12.7 g/dL (ref 12.0–15.0)
MCH: 31.2 pg (ref 26.0–34.0)
MCHC: 33 g/dL (ref 30.0–36.0)
MCV: 94.6 fL (ref 80.0–100.0)
Platelets: 352 10*3/uL (ref 150–400)
RBC: 4.07 MIL/uL (ref 3.87–5.11)
RDW: 14.6 % (ref 11.5–15.5)
WBC: 4.7 10*3/uL (ref 4.0–10.5)
nRBC: 0.4 % — ABNORMAL HIGH (ref 0.0–0.2)

## 2022-07-28 LAB — MAGNESIUM: Magnesium: 2.5 mg/dL — ABNORMAL HIGH (ref 1.7–2.4)

## 2022-07-28 LAB — COMPREHENSIVE METABOLIC PANEL
ALT: 54 U/L — ABNORMAL HIGH (ref 0–44)
AST: 62 U/L — ABNORMAL HIGH (ref 15–41)
Albumin: 3 g/dL — ABNORMAL LOW (ref 3.5–5.0)
Alkaline Phosphatase: 119 U/L (ref 38–126)
Anion gap: 26 — ABNORMAL HIGH (ref 5–15)
BUN: 100 mg/dL — ABNORMAL HIGH (ref 6–20)
CO2: 17 mmol/L — ABNORMAL LOW (ref 22–32)
Calcium: 7.5 mg/dL — ABNORMAL LOW (ref 8.9–10.3)
Chloride: 97 mmol/L — ABNORMAL LOW (ref 98–111)
Creatinine, Ser: 15.18 mg/dL — ABNORMAL HIGH (ref 0.44–1.00)
GFR, Estimated: 3 mL/min — ABNORMAL LOW (ref >60)
Glucose, Bld: 86 mg/dL (ref 70–99)
Potassium: 6.7 mmol/L (ref 3.5–5.1)
Sodium: 140 mmol/L (ref 135–145)
Total Bilirubin: 0.9 mg/dL (ref 0.3–1.2)
Total Protein: 6.8 g/dL (ref 6.5–8.1)

## 2022-07-28 LAB — BASIC METABOLIC PANEL
Anion gap: 20 — ABNORMAL HIGH (ref 5–15)
BUN: 97 mg/dL — ABNORMAL HIGH (ref 6–20)
CO2: 14 mmol/L — ABNORMAL LOW (ref 22–32)
Calcium: 7.2 mg/dL — ABNORMAL LOW (ref 8.9–10.3)
Chloride: 100 mmol/L (ref 98–111)
Creatinine, Ser: 14.38 mg/dL — ABNORMAL HIGH (ref 0.44–1.00)
GFR, Estimated: 3 mL/min — ABNORMAL LOW (ref >60)
Glucose, Bld: 159 mg/dL — ABNORMAL HIGH (ref 70–99)
Potassium: 6.2 mmol/L — ABNORMAL HIGH (ref 3.5–5.1)
Sodium: 134 mmol/L — ABNORMAL LOW (ref 135–145)

## 2022-07-28 LAB — GLUCOSE, CAPILLARY: Glucose-Capillary: 133 mg/dL — ABNORMAL HIGH (ref 70–99)

## 2022-07-28 LAB — URINALYSIS, MICROSCOPIC (REFLEX)

## 2022-07-28 LAB — RESP PANEL BY RT-PCR (FLU A&B, COVID) ARPGX2
Influenza A by PCR: NEGATIVE
Influenza B by PCR: NEGATIVE
SARS Coronavirus 2 by RT PCR: NEGATIVE

## 2022-07-28 LAB — CBG MONITORING, ED
Glucose-Capillary: 75 mg/dL (ref 70–99)
Glucose-Capillary: 173 mg/dL — ABNORMAL HIGH (ref 70–99)
Glucose-Capillary: 140 mg/dL — ABNORMAL HIGH (ref 70–99)

## 2022-07-28 LAB — PREGNANCY, URINE: Preg Test, Ur: NEGATIVE

## 2022-07-28 LAB — LIPASE, BLOOD: Lipase: 21 U/L (ref 11–51)

## 2022-07-28 MED ORDER — SODIUM ZIRCONIUM CYCLOSILICATE 10 G PO PACK
10.0000 g | PACK | Freq: Once | ORAL | Status: AC
  Administered 2022-07-28: 10 g via ORAL
  Filled 2022-07-28: qty 1

## 2022-07-28 MED ORDER — CALCITRIOL 0.25 MCG PO CAPS
0.2500 ug | ORAL_CAPSULE | Freq: Every morning | ORAL | Status: DC
  Administered 2022-07-29: 0.25 ug via ORAL
  Filled 2022-07-28: qty 1

## 2022-07-28 MED ORDER — CALCIUM GLUCONATE 10 % IV SOLN
1.0000 g | Freq: Once | INTRAVENOUS | Status: AC
  Administered 2022-07-28: 1 g via INTRAVENOUS
  Filled 2022-07-28: qty 10

## 2022-07-28 MED ORDER — SODIUM CHLORIDE 0.9 % IV SOLN: 250.0000 mL | INTRAVENOUS | Status: DC | PRN

## 2022-07-28 MED ORDER — HYDROCOD POLI-CHLORPHE POLI ER 10-8 MG/5ML PO SUER
5.0000 mL | Freq: Once | ORAL | Status: AC
  Administered 2022-07-28: 5 mL via ORAL
  Filled 2022-07-28: qty 5

## 2022-07-28 MED ORDER — HEPARIN SODIUM (PORCINE) 5000 UNIT/ML IJ SOLN
5000.0000 [IU] | Freq: Three times a day (TID) | INTRAMUSCULAR | Status: DC
  Administered 2022-07-29: 5000 [IU] via SUBCUTANEOUS
  Filled 2022-07-28: qty 1

## 2022-07-28 MED ORDER — SODIUM CHLORIDE 0.9 % IV BOLUS
500.0000 mL | Freq: Once | INTRAVENOUS | Status: AC
  Administered 2022-07-28: 500 mL via INTRAVENOUS

## 2022-07-28 MED ORDER — HYDRALAZINE HCL 50 MG PO TABS: 100.0000 mg | ORAL_TABLET | Freq: Three times a day (TID) | ORAL | Status: DC

## 2022-07-28 MED ORDER — PANTOPRAZOLE SODIUM 40 MG PO TBEC
40.0000 mg | DELAYED_RELEASE_TABLET | Freq: Every day | ORAL | Status: DC
  Administered 2022-07-29 (×2): 40 mg via ORAL
  Filled 2022-07-28 (×2): qty 1

## 2022-07-28 MED ORDER — LABETALOL HCL 5 MG/ML IV SOLN
5.0000 mg | Freq: Once | INTRAVENOUS | Status: AC
  Administered 2022-07-28: 5 mg via INTRAVENOUS
  Filled 2022-07-28: qty 4

## 2022-07-28 MED ORDER — CALCIUM CARBONATE ANTACID 500 MG PO CHEW
2.0000 | CHEWABLE_TABLET | Freq: Three times a day (TID) | ORAL | Status: DC
  Administered 2022-07-29 (×2): 400 mg via ORAL
  Filled 2022-07-28 (×2): qty 2

## 2022-07-28 MED ORDER — ACETAMINOPHEN 650 MG RE SUPP: 650.0000 mg | Freq: Four times a day (QID) | RECTAL | Status: DC | PRN

## 2022-07-28 MED ORDER — SODIUM CHLORIDE 0.9% FLUSH: 3.0000 mL | INTRAVENOUS | Status: DC | PRN

## 2022-07-28 MED ORDER — SODIUM ZIRCONIUM CYCLOSILICATE 10 G PO PACK
10.0000 g | PACK | Freq: Once | ORAL | Status: AC
  Administered 2022-07-29: 10 g via ORAL
  Filled 2022-07-28: qty 1

## 2022-07-28 MED ORDER — ONDANSETRON 4 MG PO TBDP
4.0000 mg | ORAL_TABLET | Freq: Once | ORAL | Status: AC
  Administered 2022-07-28: 4 mg via ORAL
  Filled 2022-07-28: qty 1

## 2022-07-28 MED ORDER — INSULIN PUMP
SUBCUTANEOUS | Status: DC
  Filled 2022-07-28: qty 1

## 2022-07-28 MED ORDER — FUROSEMIDE 10 MG/ML IJ SOLN
80.0000 mg | Freq: Once | INTRAMUSCULAR | Status: AC
  Administered 2022-07-28: 80 mg via INTRAVENOUS
  Filled 2022-07-28: qty 8

## 2022-07-28 MED ORDER — VITAMIN D (ERGOCALCIFEROL) 1.25 MG (50000 UNIT) PO CAPS: 50000.0000 [IU] | ORAL_CAPSULE | ORAL | Status: DC

## 2022-07-28 MED ORDER — INSULIN ASPART 100 UNIT/ML IV SOLN
5.0000 [IU] | Freq: Once | INTRAVENOUS | Status: AC
  Administered 2022-07-28: 5 [IU] via INTRAVENOUS

## 2022-07-28 MED ORDER — INSULIN ASPART 100 UNIT/ML IJ SOLN: 0.0000 [IU] | Freq: Three times a day (TID) | INTRAMUSCULAR | Status: DC

## 2022-07-28 MED ORDER — HYDRALAZINE HCL 20 MG/ML IJ SOLN: 5.0000 mg | INTRAMUSCULAR | Status: DC | PRN

## 2022-07-28 MED ORDER — CARVEDILOL 12.5 MG PO TABS
12.5000 mg | ORAL_TABLET | Freq: Two times a day (BID) | ORAL | Status: DC
  Administered 2022-07-29: 12.5 mg via ORAL
  Filled 2022-07-28: qty 1

## 2022-07-28 MED ORDER — ACETAMINOPHEN 325 MG PO TABS
650.0000 mg | ORAL_TABLET | Freq: Four times a day (QID) | ORAL | Status: DC | PRN
  Administered 2022-07-29: 650 mg via ORAL
  Filled 2022-07-28: qty 2

## 2022-07-28 MED ORDER — SODIUM CHLORIDE 0.9% FLUSH
3.0000 mL | Freq: Two times a day (BID) | INTRAVENOUS | Status: DC
  Administered 2022-07-29 (×2): 3 mL via INTRAVENOUS

## 2022-07-28 MED ORDER — PROCHLORPERAZINE EDISYLATE 10 MG/2ML IJ SOLN
5.0000 mg | Freq: Four times a day (QID) | INTRAMUSCULAR | Status: DC | PRN
  Administered 2022-07-29: 5 mg via INTRAVENOUS
  Filled 2022-07-28 (×2): qty 1

## 2022-07-28 MED ORDER — ISOSORBIDE MONONITRATE ER 30 MG PO TB24
30.0000 mg | ORAL_TABLET | Freq: Every day | ORAL | Status: DC
  Administered 2022-07-29: 30 mg via ORAL
  Filled 2022-07-28: qty 1

## 2022-07-28 MED ORDER — DEXTROSE 50 % IV SOLN
1.0000 | Freq: Once | INTRAVENOUS | Status: AC
  Administered 2022-07-28: 50 mL via INTRAVENOUS
  Filled 2022-07-28: qty 50

## 2022-07-28 MED ORDER — MAGNESIUM OXIDE -MG SUPPLEMENT 400 (240 MG) MG PO TABS
400.0000 mg | ORAL_TABLET | Freq: Two times a day (BID) | ORAL | Status: DC
  Administered 2022-07-29: 400 mg via ORAL
  Filled 2022-07-28: qty 1

## 2022-07-28 MED ORDER — HYDRALAZINE HCL 25 MG PO TABS
25.0000 mg | ORAL_TABLET | Freq: Once | ORAL | Status: AC
  Administered 2022-07-28: 25 mg via ORAL
  Filled 2022-07-28: qty 1

## 2022-07-28 MED ORDER — ACETAMINOPHEN 500 MG PO TABS: 1000.0000 mg | ORAL_TABLET | Freq: Four times a day (QID) | ORAL | Status: DC | PRN

## 2022-07-28 MED ORDER — CLONAZEPAM 0.5 MG PO TABS: 0.5000 mg | ORAL_TABLET | Freq: Two times a day (BID) | ORAL | Status: DC | PRN

## 2022-07-28 MED ORDER — HYDRALAZINE HCL 50 MG PO TABS
100.0000 mg | ORAL_TABLET | Freq: Three times a day (TID) | ORAL | Status: DC
  Administered 2022-07-29: 100 mg via ORAL
  Filled 2022-07-28: qty 2

## 2022-07-28 MED ORDER — ATORVASTATIN CALCIUM 40 MG PO TABS
40.0000 mg | ORAL_TABLET | Freq: Every day | ORAL | Status: DC
  Administered 2022-07-29: 40 mg via ORAL
  Filled 2022-07-28: qty 1

## 2022-07-28 NOTE — Assessment & Plan Note (Signed)
Continue lipitor  ?

## 2022-07-28 NOTE — Progress Notes (Signed)
Hospitalist Transfer Note:  Transferring facility: [facility] Requesting provider: Pati Gallo, PA (EDP at [facility]) Reason for transfer: admission for further evaluation and management of hyperkalemia.     26  year old female with medical history notable for ESRD on PD, who presented to [facility] ED complaining of  3 days of n/v/d. She reports good compliance with home PD .Labs at [facility] notable for potassium 6.7, BUN 100, Cr 15, and she has an elevated anion gap metabolic acidosis.  No evidence of acute respiratory distress, and is maintaining O2 sats in the high 90s on room air.  Chest x-ray showed no evidence of acute process, including no evidence of acute volume overload. EKG without any reported acute ischemic/hyperkalemic changes.   Medical management of hyperkalemia initiated, including dose of calcium gluconate , dose of IV insulin with amp of D50, IV fluids, Lasix, dose of Lokelma, with a second dose pf Lokelma ordered by EDP, but not yet given.  EDP at [facility] d/w on-call nephrology (Dr. Royce Macadamia), who recommended admit to [facility], where she will formally consult.   Of note, outside of the access for her peritoneal dialysis, it does not sound like she has access at this time for hemodialysis.    Subsequently, I accepted this patient for transfer for inpatient admission to a pcu bed at [facility] for further work-up and management of the above.        Check [email or web address] for on-call coverage.   Nursing staff, Please call [place] number on Amion as soon as patient's arrival, so appropriate admitting provider can evaluate the pt.     Babs Bertin, DO Hospitalist

## 2022-07-28 NOTE — Progress Notes (Signed)
Patient arrived to room 3E10 from [place] ED.  Assessment complete, VS obtained, and Admission database began.

## 2022-07-28 NOTE — ED Notes (Signed)
Sodium Zirconium administered by Josph Macho RN

## 2022-07-28 NOTE — Assessment & Plan Note (Signed)
A bit labile Continue coreg 12.5mg  Bid and imdur QHS today Hold hydralazine 100mg  TID to start tomorrow and will need to hold if pressure on softer side.

## 2022-07-28 NOTE — ED Provider Notes (Signed)
[facility] Provider Note   CSN: [id number] Arrival date & time: 07/28/22  1416     History  Chief Complaint  Patient presents with   Shortness of Breath   Emesis    Kathy Lewis is a 26 y.o. female.   Shortness of Breath Associated symptoms: vomiting   Emesis Patient is a 26 year old female with past medical history significant for poorly controlled DM1, hypokalemia, CHF, anxiety, anemia, bipolar 2, HTN, hyperkalemia, gastroparesis, VTE, DKA, [name] presents emergency  Patient states that over the past 3 days she has been coughing and feeling lightheaded she has had nausea vomiting diarrhea she states nonbloody nonbilious emesis.  She has had some watery diarrhea and some simply loose stool.  No fevers no recent antibiotic use.  No recent travel outside the country.  She states she has a Dexcom and has been experiencing blood sugars in approximately 300s range which is somewhat normal for her she states.  She says her blood sugars are running slightly low today.      Home Medications Prior to Admission medications   Medication Sig Start Date End Date Taking? Authorizing Provider  acetaminophen (TYLENOL) 500 MG tablet Take 1,000 mg by mouth every 6 (six) hours as needed for headache (pain).    [provider]  amitriptyline (ELAVIL) 10 MG tablet Take 1 tablet (10 mg total) by mouth at bedtime. 05/25/22   [name], MD  atorvastatin (LIPITOR) 40 MG tablet Take 40 mg by mouth daily.    [provider]  butalbital-acetaminophen-caffeine (FIORICET) 50-325-40 MG tablet Take 1 tablet by mouth daily as needed for headache.    [provider]  calcitRIOL (ROCALTROL) 0.25 MCG capsule Take 0.25 mcg by mouth every morning. 08/03/21   [provider]  calcium carbonate (TUMS - DOSED IN MG ELEMENTAL CALCIUM) 500 MG chewable tablet Take 2 tablets by mouth 3 (three) times daily with meals.    [provider]   carvedilol (COREG) 12.5 MG tablet Take 12.5 mg by mouth 2 (two) times daily with a meal.    [provider]  chlorpheniramine-HYDROcodone (TUSSIONEX) 10-8 MG/5ML Take 5 mLs by mouth every 12 (twelve) hours as needed for cough. 07/06/22   Deno Etienne, DO  clonazePAM (KLONOPIN) 0.5 MG tablet Take 1 tablet (0.5 mg total) by mouth 2 (two) times daily as needed for anxiety. Patient taking differently: Take 0.5 mg by mouth 2 (two) times daily. 03/16/22   [name], MD  ENTRESTO 97-103 MG Take 2 tablets by mouth daily. 04/03/22   [provider]  hydrALAZINE (APRESOLINE) 100 MG tablet Take 100 mg by mouth 3 (three) times daily. 08/05/21   [provider]  HYDROcodone bit-homatropine (HYCODAN) 5-1.5 MG/5ML syrup Take 5 mLs by mouth every 6 (six) hours as needed for cough. 07/06/22   Deno Etienne, DO  insulin lispro (HUMALOG) 100 UNIT/ML injection Inject 0.5 Units into the skin See admin instructions. Via Insulin Pump - 1/2 unit per hour 01/08/22   [provider]  isosorbide mononitrate (IMDUR) 30 MG 24 hr tablet Take 30 mg by mouth in the morning and at bedtime.    [provider]  magnesium oxide (MAG-OX) 400 MG tablet Take 400 mg by mouth 2 (two) times daily. 08/03/21   [provider]  methocarbamol (ROBAXIN) 500 MG tablet Take 500 mg by mouth every 8 (eight) hours as needed for muscle spasms.    [provider]  ondansetron (ZOFRAN) 4 MG tablet  Take 1 tablet (4 mg total) by mouth every 6 (six) hours. Patient taking differently: Take 4 mg by mouth every 6 (six) hours as needed for nausea or vomiting. 04/11/22   [name], PA  pantoprazole (PROTONIX) 40 MG tablet Take 1 tablet (40 mg total) by mouth 2 (two) times daily before a meal. 04/11/22   Lemmon, Lavone Nian, PA  Vitamin D, Ergocalciferol, (DRISDOL) 1.25 MG (50000 UNIT) CAPS capsule Take 50,000 Units by mouth every Sunday.    [provider]  escitalopram (LEXAPRO)  10 MG tablet Take 20 mg by mouth daily.  05/10/20 10/06/20  [provider]  furosemide (LASIX) 40 MG tablet Take 1 tablet (40 mg total) by mouth daily. Patient not taking: No sig reported 10/28/19 02/17/21  [name], MD  lisinopril (ZESTRIL) 10 MG tablet Take 10 mg by mouth daily.  02/23/20 10/06/20  [provider]  spironolactone (ALDACTONE) 25 MG tablet Take 25 mg by mouth daily.  04/20/20 10/06/20  [provider]      Allergies    Cantaloupe extract allergy skin test, Strawberry extract, Citrullus vulgaris, and Nsaids    Review of Systems   Review of Systems  Respiratory:  Positive for shortness of breath.   Gastrointestinal:  Positive for vomiting.    Physical Exam Updated Vital Signs BP (!) 135/110   Pulse 87   Temp 98 F (36.7 C)   Resp 18   SpO2 97%  Physical Exam Vitals and nursing note reviewed.  Constitutional:      General: She is not in acute distress.    Comments: Chronically ill-appearing appearing 26 year old female, occasionally retching  HENT:     Head: Normocephalic and atraumatic.     Nose: Nose normal.     Mouth/Throat:     Mouth: Mucous membranes are dry.  Eyes:     General: No scleral icterus. Neck:     Comments: No JVP Cardiovascular:     Rate and Rhythm: Regular rhythm. Tachycardia present.     Pulses: Normal pulses.     Heart sounds: Normal heart sounds.  Pulmonary:     Effort: Pulmonary effort is normal. No respiratory distress.     Breath sounds: No wheezing.  Abdominal:     Palpations: Abdomen is soft.     Tenderness: There is no abdominal tenderness. There is no guarding or rebound.     Comments: Abdomen with insulin meter and right lower abdomen and peritoneal dialysis port in place with no surrounding cellulitis  Musculoskeletal:     Cervical back: Normal range of motion.     Right lower leg: No edema.     Left lower leg: No edema.     Comments: No lower extremity edema or calf tenderness no pitting edema   Skin:    General: Skin is warm and dry.     Capillary Refill: Capillary refill takes less than 2 seconds.  Neurological:     Mental Status: She is alert. Mental status is at baseline.  Psychiatric:        Mood and Affect: Mood normal.        Behavior: Behavior normal.     ED Results / Procedures / Treatments   Labs (all labs ordered are listed, but only abnormal results are displayed) Labs Reviewed  COMPREHENSIVE METABOLIC PANEL - Abnormal; Notable for the following components:      Result Value   Potassium 6.7 (*)    Chloride 97 (*)    CO2  17 (*)    BUN 100 (*)    Creatinine, Ser 15.18 (*)    Calcium 7.5 (*)    Albumin 3.0 (*)    AST 62 (*)    ALT 54 (*)    GFR, Estimated 3 (*)    Anion gap 26 (*)    All other components within normal limits  CBC - Abnormal; Notable for the following components:   nRBC 0.4 (*)    All other components within normal limits  URINALYSIS, ROUTINE W REFLEX MICROSCOPIC - Abnormal; Notable for the following components:   Hgb urine dipstick MODERATE (*)    Protein, ur >=300 (*)    All other components within normal limits  MAGNESIUM - Abnormal; Notable for the following components:   Magnesium 2.5 (*)    All other components within normal limits  URINALYSIS, MICROSCOPIC (REFLEX) - Abnormal; Notable for the following components:   Bacteria, UA RARE (*)    All other components within normal limits  BASIC METABOLIC PANEL - Abnormal; Notable for the following components:   Sodium 134 (*)    Potassium 6.2 (*)    CO2 14 (*)    Glucose, Bld 159 (*)    BUN 97 (*)    Creatinine, Ser 14.38 (*)    Calcium 7.2 (*)    GFR, Estimated 3 (*)    Anion gap 20 (*)    All other components within normal limits  CBG MONITORING, ED - Abnormal; Notable for the following components:   Glucose-Capillary 173 (*)    All other components within normal limits  RESP PANEL BY RT-PCR (FLU A&B, COVID) ARPGX2  LIPASE, BLOOD  PREGNANCY, URINE  CBG MONITORING, ED     EKG EKG Interpretation  Date/Time:  Friday July 28 2022 15:10:07 EST Ventricular Rate:  102 PR Interval:  148 QRS Duration: 100 QT Interval:  414 QTC Calculation: 539 R Axis:   -69 Text Interpretation: Sinus tachycardia Possible Left atrial enlargement Left anterior fascicular block Prolonged QT Abnormal ECG When compared with ECG of 30-Jun-2022 21:47, PREVIOUS ECG IS PRESENT Confirmed by Nanda Quinton [phone]) on 07/28/2022 5:07:11 PM  Radiology DG Chest Portable 1 View  Result Date: 07/28/2022 CLINICAL DATA:  Cough EXAM: PORTABLE CHEST 1 VIEW COMPARISON:  Chest x-ray 11 1 2023.  Chest CT 11/22/2021. FINDINGS: The heart is enlarged, unchanged. The lungs are clear. There is no pleural effusion or pneumothorax. No acute fractures are identified. IMPRESSION: Stable cardiomegaly. No acute cardiopulmonary process. Electronically Signed   By: Ronney Asters M.D.   On: 07/28/2022 17:30    Procedures .Critical Care  Performed by: Tedd Sias, PA Authorized by: Tedd Sias, PA   Critical care provider statement:    Critical care time (minutes):  35   Critical care time was exclusive of:  Separately billable procedures and treating other patients and teaching time   Critical care was necessary to treat or prevent imminent or life-threatening deterioration of the following conditions: endocrine.   Critical care was time spent personally by me on the following activities:  Development of treatment plan with patient or surrogate, review of old charts, re-evaluation of patient's condition, pulse oximetry, ordering and review of radiographic studies, ordering and review of laboratory studies, ordering and performing treatments and interventions, obtaining history from patient or surrogate, examination of patient and evaluation of patient's response to treatment   Care discussed with: admitting provider       Medications Ordered in ED Medications  hydrALAZINE (APRESOLINE)  tablet 25  mg (has no administration in time range)  ondansetron (ZOFRAN-ODT) disintegrating tablet 4 mg (4 mg Oral Given 07/28/22 1541)  sodium zirconium cyclosilicate (LOKELMA) packet 10 g (10 g Oral Given 07/28/22 1737)  insulin aspart (novoLOG) injection 5 Units (5 Units Intravenous Given 07/28/22 1723)    And  dextrose 50 % solution 50 mL (50 mLs Intravenous Given 07/28/22 1724)  calcium gluconate inj 10% (1 g) URGENT USE ONLY! (1 g Intravenous Given 07/28/22 1728)  sodium chloride 0.9 % bolus 500 mL (0 mLs Intravenous Stopped 07/28/22 1836)  chlorpheniramine-HYDROcodone (TUSSIONEX) 10-8 MG/5ML suspension 5 mL (5 mLs Oral Given 07/28/22 1727)  labetalol (NORMODYNE) injection 5 mg (5 mg Intravenous Given 07/28/22 1836)  furosemide (LASIX) injection 80 mg (80 mg Intravenous Given 07/28/22 1927)    ED Course/ Medical Decision Making/ A&P Clinical Course as of 07/28/22 1933  Fri Jul 28, 2022  1909 Lokelma in 6 hours, lasix IV 80 x1  Dr. Royce Macadamia  Admit to [facility] [[facility]]    Clinical Course User Index [WF] Tedd Sias, PA                           Medical Decision Making Amount and/or Complexity of Data Reviewed Labs: ordered. Radiology: ordered.  Risk OTC drugs. Prescription drug management. Decision regarding hospitalization.   This patient presents to the ED for concern of nausea vomiting cough and fatigue, this involves a number of treatment options, and is a complaint that carries with it a moderate to high risk of complications and morbidity. A differential diagnosis was considered for the patient's symptoms which is discussed below:   Viral illness, worsening kidney disease, pneumonia, UTI, other infection   Co morbidities: Discussed in HPI   Brief History:  Patient is a 26 year old female with past medical history significant for poorly controlled DM1, hypokalemia, CHF, anxiety, anemia, bipolar 2, HTN, hyperkalemia, gastroparesis, VTE, DKA, [name] presents emergency  Patient states  that over the past 3 days she has been coughing and feeling lightheaded she has had nausea vomiting diarrhea she states nonbloody nonbilious emesis.  She has had some watery diarrhea and some simply loose stool.  No fevers no recent antibiotic use.  No recent travel outside the country.  She states she has a Dexcom and has been experiencing blood sugars in approximately 300s range which is somewhat normal for her she states.  She says her blood sugars are running slightly low today.    EMR reviewed including pt PMHx, past surgical history and past visits to ER.   See HPI for more details   Lab Tests:   I ordered and independently interpreted labs. Labs notable for potassium elevated at 6.7, creatinine elevated at 15, BUN 100, mild transaminitis anion gap of 26  Repeat electrolytes with potassium improved to 6.2, bicarb slightly worsened at 14 and anion gap improved to 20.   Imaging Studies:  NAD. I personally reviewed all imaging studies and no acute abnormality found. I agree with radiology interpretation. Cardiomegaly but no acute changes no pneumonia   Cardiac Monitoring:  The patient was maintained on a cardiac monitor.  I personally viewed and interpreted the cardiac monitored which showed an underlying rhythm of: NSR EKG non-ischemic slight QT prolongation   Medicines ordered:  I ordered medication including hydralazine, Lasix, labetalol, Lokelma for hyperkalemia, calcium gluconate also given, 1 dose of Tussionex, insulin 5 units and dextrose given, Zofran, as needed Tylenol and additional 6  hours later low, dose ordered Reevaluation of the patient after these medicines showed that the patient improved I have reviewed the patients home medicines and have made adjustments as needed   Critical Interventions:     Consults/Attending Physician   I requested consultation with Dr. Royce Macadamia of nephrology who recommended [facility], was agreeable with my administration of calcium  gluconate, Lokelma, dextrose and insulin, she also instructed me to add on 80 mg of Lasix IV x 1,  and discussed lab and imaging findings as well as pertinent plan - they recommend: Admission to hospitalist service   Reevaluation:  After the interventions noted above I re-evaluated patient and found that they have :improved No longer vomiting/retching   Social Determinants of Health:      Problem List / ED Course:  Hyperkalemia - in setting of worsening kidney disease.  Discussed with Dr. Royce Macadamia of nephrology.  Hyperkalemia treated with calcium gluconate, Lokelma, insulin and glucose, Lasix. Hypertension.  Treated with labetalol and p.o. hydralazine Lasix likely unhelpful in the setting. Elevated BUN -patient states that she continues to do peritoneal dialysis.   Dispostion:  After consideration of the diagnostic results and the patients response to treatment, I feel that the patent would benefit from admission     Final Clinical Impression(s) / ED Diagnoses Final diagnoses:  Nausea vomiting and diarrhea  Hyperkalemia    Rx / DC Orders ED Discharge Orders     None         Tedd Sias, [place] 07/28/22 2339    Margette Fast, MD 08/03/22 1510

## 2022-07-28 NOTE — Assessment & Plan Note (Signed)
Appears euvolemic Volume control per PD Echo in 10/2021: EF of 20%. Small pericardial effusion. Indeterminate diastolic function

## 2022-07-28 NOTE — ED Triage Notes (Signed)
Shortness of breath x 3 days , cough , lightheaded. Emesis x 3 days .

## 2022-07-28 NOTE — ED Notes (Signed)
ED TO INPATIENT HANDOFF REPORT  ED Nurse Name and Phone #: [facility] Paramedic [phone]  S Name/Age/Gender Kathy Lewis 26 y.o. female Room/Bed: MH01/MH01  Code Status   Code Status: Prior  Home/SNF/Other Home Patient oriented to: self, place, time, and situation Is this baseline? Yes   Triage Complete: Triage complete  Chief Complaint Hyperkalemia [E87.5]  Triage Note Shortness of breath x 3 days , cough , lightheaded. Emesis x 3 days .    Allergies Allergies  Allergen Reactions   Cantaloupe Extract Allergy Skin Test Itching    Mouth itching     Strawberry Extract Itching    Mouth itches   Citrullus Vulgaris Itching    Makes mouth itch , ALL melons    Nsaids Other (See Comments)    Avoid per nephrology Other reaction(s): Other Avoid per nephrology Avoid per nephrology    Level of Care/Admitting Diagnosis ED Disposition     ED Disposition  Admit   Condition  --   [place]: [place] [[id number]]  Level of Care: Progressive [102]  Admit to Progressive based on following criteria: MULTISYSTEM THREATS such as stable sepsis, metabolic/electrolyte imbalance with or without encephalopathy that is responding to early treatment.  Admit to Progressive based on following criteria: NEPHROLOGY stable condition requiring close monitoring for AKI, requiring Hemodialysis or Peritoneal Dialysis either from expected electrolyte imbalance, acidosis, or fluid overload that can be managed by NIPPV or high flow oxygen.  Admit to Progressive based on following criteria: CARDIOVASCULAR & THORACIC of moderate stability with acute coronary syndrome symptoms/low risk myocardial infarction/hypertensive urgency/arrhythmias/heart failure potentially compromising stability and stable post cardiovascular intervention patients.  May [facility] patient to [facility] or [facility] if equivalent level of care is available:: No  Interfacility transfer:  Yes  Covid Evaluation: Asymptomatic - no recent exposure (last 10 days) testing not required  Diagnosis: Hyperkalemia [147829]  Admitting Physician: Rhetta Mura [[id number]]  Attending Physician: Rhetta Mura [[id number]]  Certification:: I certify this patient will need inpatient services for at least 2 midnights  Estimated Length of Stay: 2          B Medical/Surgery History Past Medical History:  Diagnosis Date   Anemia    Anxiety    Bipolar 2 disorder ([place])    Chronic kidney disease    Chronic systolic (congestive) heart failure (HCC)    Depression    DKA (diabetic ketoacidoses)    ESRD on peritoneal dialysis ([place])    HSV infection    on valtrex   Hypokalemia    Leukocytosis    Migraine    Noncompliance with medication regimen    Preeclampsia    Prolonged QT syndrome    Severe anemia    Type 1 diabetes mellitus ([place])    Past Surgical History:  Procedure Laterality Date   BIOPSY  04/24/2022   Procedure: BIOPSY;  Surgeon: [name], MD;  Location: [place];  Service: Gastroenterology;;   CARDIAC CATHETERIZATION     COLONOSCOPY WITH PROPOFOL N/A 04/24/2022   Procedure: COLONOSCOPY WITH PROPOFOL;  Surgeon: [name], MD;  Location: [facility] ENDOSCOPY;  Service: Gastroenterology;  Laterality: N/A;   DILATION AND EVACUATION N/A 10/22/2019   Procedure: ULTRASOUND GUIDED DILATATION AND EVACUATION;  Surgeon: [name], MD;  Location: [facility] LD ORS;  Service: Gynecology;  Laterality: N/A;   ESOPHAGOGASTRODUODENOSCOPY (EGD) WITH PROPOFOL N/A 04/24/2022   Procedure: ESOPHAGOGASTRODUODENOSCOPY (EGD) WITH PROPOFOL;  Surgeon: [name], MD;  Location: [facility]  ENDOSCOPY;  Service: Gastroenterology;  Laterality: N/A;   NO PAST SURGERIES     peritoneal dialysis catheter insertion     RENAL BIOPSY       A IV Location/Drains/Wounds Patient Lines/Drains/Airways Status     Active Line/Drains/Airways     Name Placement date Placement time Site Days    Peripheral IV 07/28/22 20 G Left Antecubital 07/28/22  1545  Antecubital  less than 1            Intake/Output Last 24 hours  Intake/Output Summary (Last 24 hours) at 07/28/2022 2044 Last data filed at 07/28/2022 1836 Gross per 24 hour  Intake 501.99 ml  Output --  Net 501.99 ml    Labs/Imaging Results for orders placed or performed during the hospital encounter of 07/28/22 (from the past 48 hour(s))  Urinalysis, Routine w reflex microscopic Urine, Clean Catch     Status: Abnormal   Collection Time: 07/28/22  3:03 PM  Result Value Ref Range   Color, Urine YELLOW YELLOW   APPearance CLEAR CLEAR   Specific Gravity, Urine 1.025 1.005 - 1.030   pH 5.5 5.0 - 8.0   Glucose, UA NEGATIVE NEGATIVE mg/dL   Hgb urine dipstick MODERATE (A) NEGATIVE   Bilirubin Urine NEGATIVE NEGATIVE   Ketones, ur NEGATIVE NEGATIVE mg/dL   Protein, ur >=300 (A) NEGATIVE mg/dL   Nitrite NEGATIVE NEGATIVE   Leukocytes,Ua NEGATIVE NEGATIVE    Comment: Performed at [facility], [address] [place], [place] [address]  Pregnancy, urine     Status: None   Collection Time: 07/28/22  3:03 PM  Result Value Ref Range   Preg Test, Ur NEGATIVE NEGATIVE    Comment:        THE SENSITIVITY OF THIS METHODOLOGY IS >20 mIU/mL. Performed at [facility], [address] [place], [place] [address]   Urinalysis, Microscopic (reflex)     Status: Abnormal   Collection Time: 07/28/22  3:03 PM  Result Value Ref Range   RBC / HPF 6-10 0 - 5 RBC/hpf   WBC, UA 0-5 0 - 5 WBC/hpf   Bacteria, UA RARE (A) NONE SEEN   Squamous Epithelial / LPF 0-5 0 - 5    Comment: Performed at [facility], [address] [place], [place] [address]  CBG monitoring, ED     Status: None   Collection Time: 07/28/22  3:06 PM  Result Value Ref Range   Glucose-Capillary 75 70 - 99 mg/dL    Comment: Glucose reference range applies only to samples taken after fasting for at least 8 hours.  Resp Panel by  RT-PCR (Flu A&B, Covid) Anterior Nasal Swab     Status: None   Collection Time: 07/28/22  3:11 PM   Specimen: Anterior Nasal Swab  Result Value Ref Range   SARS Coronavirus 2 by RT PCR NEGATIVE NEGATIVE    Comment: (NOTE) SARS-CoV-2 target nucleic acids are NOT DETECTED.  The SARS-CoV-2 RNA is generally detectable in upper respiratory specimens during the acute phase of infection. The lowest concentration of SARS-CoV-2 viral copies this assay can detect is 138 copies/mL. A negative result does not preclude SARS-Cov-2 infection and should not be used as the sole basis for treatment or other patient management decisions. A negative result may occur with  improper specimen collection/handling, submission of specimen other than nasopharyngeal swab, presence of viral mutation(s) within the areas targeted by this assay, and inadequate number of viral copies(<138 copies/mL). A  negative result must be combined with clinical observations, patient history, and epidemiological information. The expected result is Negative.  Fact Sheet for Patients:  EntrepreneurPulse.com.au  Fact Sheet for Healthcare Providers:  IncredibleEmployment.be  This test is no t yet approved or cleared by the [place] FDA and  has been authorized for detection and/or diagnosis of SARS-CoV-2 by FDA under an Emergency Use Authorization (EUA). This EUA will remain  in effect (meaning this test can be used) for the duration of the COVID-19 declaration under Section 564(b)(1) of the Act, 21 U.S.C.section 360bbb-3(b)(1), unless the authorization is terminated  or revoked sooner.       Influenza A by PCR NEGATIVE NEGATIVE   Influenza B by PCR NEGATIVE NEGATIVE    Comment: (NOTE) The Xpert Xpress SARS-CoV-2/FLU/RSV plus assay is intended as an aid in the diagnosis of influenza from Nasopharyngeal swab specimens and should not be used as a sole basis for treatment. Nasal washings  and aspirates are unacceptable for Xpert Xpress SARS-CoV-2/FLU/RSV testing.  Fact Sheet for Patients: EntrepreneurPulse.com.au  Fact Sheet for Healthcare Providers: IncredibleEmployment.be  This test is not yet approved or cleared by the [place] FDA and has been authorized for detection and/or diagnosis of SARS-CoV-2 by FDA under an Emergency Use Authorization (EUA). This EUA will remain in effect (meaning this test can be used) for the duration of the COVID-19 declaration under Section 564(b)(1) of the Act, 21 U.S.C. section 360bbb-3(b)(1), unless the authorization is terminated or revoked.  Performed at [facility], [address] [place], [place] [address]   Lipase, blood     Status: None   Collection Time: 07/28/22  3:47 PM  Result Value Ref Range   Lipase 21 11 - 51 U/L    Comment: Performed at [facility], [address] [place], [place] [address]  Comprehensive metabolic panel     Status: Abnormal   Collection Time: 07/28/22  3:47 PM  Result Value Ref Range   Sodium 140 135 - 145 mmol/L   Potassium 6.7 (HH) 3.5 - 5.1 mmol/L    Comment: CRITICAL RESULT CALLED TO, READ BACK BY AND VERIFIED WITH SOPHIE GOUGE RN AT 1704 ON 07/28/22 BY I.SUGUT ELECTROLYTES REPEATED TO VERIFY RESULTS VERIFIED BY REPEAT TESTING    Chloride 97 (L) 98 - 111 mmol/L   CO2 17 (L) 22 - 32 mmol/L   Glucose, Bld 86 70 - 99 mg/dL    Comment: Glucose reference range applies only to samples taken after fasting for at least 8 hours.   BUN 100 (H) 6 - 20 mg/dL   Creatinine, Ser 15.18 (H) 0.44 - 1.00 mg/dL   Calcium 7.5 (L) 8.9 - 10.3 mg/dL   Total Protein 6.8 6.5 - 8.1 g/dL   Albumin 3.0 (L) 3.5 - 5.0 g/dL   AST 62 (H) 15 - 41 U/L   ALT 54 (H) 0 - 44 U/L   Alkaline Phosphatase 119 38 - 126 U/L   Total Bilirubin 0.9 0.3 - 1.2 mg/dL   GFR, Estimated 3 (L) >60 mL/min    Comment: (NOTE) Calculated using the CKD-EPI Creatinine Equation  (2021)    Anion gap 26 (H) 5 - 15    Comment: Performed at [facility], [address] [place], [place] [address]  CBC     Status: Abnormal   Collection Time: 07/28/22  3:47 PM  Result Value Ref Range   WBC 4.7 4.0 - 10.5 K/uL   RBC  4.07 3.87 - 5.11 MIL/uL   Hemoglobin 12.7 12.0 - 15.0 g/dL   HCT 38.5 36.0 - 46.0 %   MCV 94.6 80.0 - 100.0 fL   MCH 31.2 26.0 - 34.0 pg   MCHC 33.0 30.0 - 36.0 g/dL   RDW 14.6 11.5 - 15.5 %   Platelets 352 150 - 400 K/uL   nRBC 0.4 (H) 0.0 - 0.2 %    Comment: Performed at [facility], [address] [place], [place] [address]  Magnesium     Status: Abnormal   Collection Time: 07/28/22  3:47 PM  Result Value Ref Range   Magnesium 2.5 (H) 1.7 - 2.4 mg/dL    Comment: Performed at [facility], [address] [place], [place] [address]  CBG monitoring, ED     Status: Abnormal   Collection Time: 07/28/22  6:51 PM  Result Value Ref Range   Glucose-Capillary 173 (H) 70 - 99 mg/dL    Comment: Glucose reference range applies only to samples taken after fasting for at least 8 hours.  Basic metabolic panel     Status: Abnormal   Collection Time: 07/28/22  7:09 PM  Result Value Ref Range   Sodium 134 (L) 135 - 145 mmol/L   Potassium 6.2 (H) 3.5 - 5.1 mmol/L   Chloride 100 98 - 111 mmol/L   CO2 14 (L) 22 - 32 mmol/L   Glucose, Bld 159 (H) 70 - 99 mg/dL    Comment: Glucose reference range applies only to samples taken after fasting for at least 8 hours.   BUN 97 (H) 6 - 20 mg/dL   Creatinine, Ser 14.38 (H) 0.44 - 1.00 mg/dL   Calcium 7.2 (L) 8.9 - 10.3 mg/dL   GFR, Estimated 3 (L) >60 mL/min    Comment: (NOTE) Calculated using the CKD-EPI Creatinine Equation (2021)    Anion gap 20 (H) 5 - 15    Comment: Performed at [facility], [address], [place] [address]  POC CBG, ED     Status: Abnormal   Collection Time: 07/28/22  8:30 PM  Result Value Ref Range   Glucose-Capillary 140 (H) 70 - 99  mg/dL    Comment: Glucose reference range applies only to samples taken after fasting for at least 8 hours.   DG Chest Portable 1 View  Result Date: 07/28/2022 CLINICAL DATA:  Cough EXAM: PORTABLE CHEST 1 VIEW COMPARISON:  Chest x-ray 11 1 2023.  Chest CT 11/22/2021. FINDINGS: The heart is enlarged, unchanged. The lungs are clear. There is no pleural effusion or pneumothorax. No acute fractures are identified. IMPRESSION: Stable cardiomegaly. No acute cardiopulmonary process. Electronically Signed   By: Ronney Asters M.D.   On: 07/28/2022 17:30    Pending Labs Unresulted Labs (From admission, onward)    None       Vitals/Pain Today's Vitals   07/28/22 1901 07/28/22 1930 07/28/22 1942 07/28/22 2014  BP: (!) 146/113 (!) 135/110 (!) 151/121 (!) 156/128  Pulse: 86 87    Resp: (!) 22 18    Temp:      TempSrc:      SpO2:  97%    PainSc:        Isolation Precautions No active isolations  Medications Medications  acetaminophen (TYLENOL) tablet 1,000 mg (has no administration in time range)    Or  acetaminophen (TYLENOL) suppository 650 mg (has no administration in time range)  hydrALAZINE (APRESOLINE) injection 5  mg (has no administration in time range)  ondansetron (ZOFRAN-ODT) disintegrating tablet 4 mg (4 mg Oral Given 07/28/22 1541)  sodium zirconium cyclosilicate (LOKELMA) packet 10 g (10 g Oral Given 07/28/22 1737)  insulin aspart (novoLOG) injection 5 Units (5 Units Intravenous Given 07/28/22 1723)    And  dextrose 50 % solution 50 mL (50 mLs Intravenous Given 07/28/22 1724)  calcium gluconate inj 10% (1 g) URGENT USE ONLY! (1 g Intravenous Given 07/28/22 1728)  sodium chloride 0.9 % bolus 500 mL (0 mLs Intravenous Stopped 07/28/22 1836)  chlorpheniramine-HYDROcodone (TUSSIONEX) 10-8 MG/5ML suspension 5 mL (5 mLs Oral Given 07/28/22 1727)  labetalol (NORMODYNE) injection 5 mg (5 mg Intravenous Given 07/28/22 1836)  hydrALAZINE (APRESOLINE) tablet 25 mg (25 mg Oral Given 07/28/22  1942)  furosemide (LASIX) injection 80 mg (80 mg Intravenous Given 07/28/22 1927)    Mobility walks with person assist Low fall risk   Focused Assessments Renal Assessment Handoff:  Hemodialysis Schedule: Hemodialysis Schedule: Tuesday/Thursday/Saturday Last Hemodialysis date and time: 07/27/2022   Restricted appendage:  None   R Recommendations: See Admitting Provider Note  Report given to:   Additional Notes:

## 2022-07-28 NOTE — H&P (Signed)
History and Physical    Patient: Kathy Lewis OEU:[id number] DOB: 1996/08/25 DOA: 07/28/2022 DOS: the patient was seen and examined on 07/29/2022 PCP: System, Provider Not In  Patient coming from:  [facility]  - lives with her daughter    Chief Complaint: N/V x 3 days   HPI: Kathy Lewis is a 26 y.o. 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 She also states she has had a few episodes of loose stool. She states her vomiting has been intractable. She denies any abdominal pain. She does PD daily at home and last session was last night. She has been able to keep her medication and liquids down. She is no longer having material emesis, mainly dry heaving. She is on an insulin pump, denies any hypoglycemic events.   Frequent hospitalizations for N/V and abdominal pain and DKA. Recent admit on 11/3 for N/V and abdominal pain. CT abdomen/pelvis at that time wnl. Left AMA on 11/4. Discharged before this on 11/1 for DKA and HTN urgency. Also left AMA on 10/31. EGD on 04/24/22: wnl with normal biopsies and negative h.pylori. gi thought related to cyclic vomiting syndrome. Also has had a normal gastric emptying study.   She does not smoke or drink alcohol.   Denies any fever/chills, vision changes/headaches, chest pain or palpitations, shortness of breath or cough, abdominal pain, dysuria or leg swelling.    ER Course:  vitals: afebrile, bp: 141/102, HR: 101, RR: 18, oxygen: 100%RA Pertinent labs: potassium: 6.7, BUN; 100, creatinine: 15.18, AST: 62, ALT: 54, AG: 26, repeat bmp: 6.2, AG to 20  CXR: stable. No acute finding   In ED: nephrology consulted. Given 500cc bolus, lokelma, labetalol, novolog and dextrose, lasix 80mg  and calcium gluconate. Transferred to [facility] for admit   Review of  Systems: As mentioned in the history of present illness. All other systems reviewed and are negative. Past Medical History:  Diagnosis Date   Anemia    Anxiety    Bipolar 2 disorder (HCC)    Chronic kidney disease    Chronic systolic (congestive) heart failure (HCC)    Depression    DKA (diabetic ketoacidoses)    ESRD on peritoneal dialysis ([place])    HSV infection    on valtrex   Hypokalemia    Leukocytosis    Migraine    Noncompliance with medication regimen    Preeclampsia    Prolonged QT syndrome    Severe anemia    Type 1 diabetes mellitus ([place])    Past Surgical History:  Procedure Laterality Date   BIOPSY  04/24/2022   Procedure: BIOPSY;  Surgeon: [name], MD;  Location: [place];  Service: Gastroenterology;;   CARDIAC CATHETERIZATION     COLONOSCOPY WITH PROPOFOL N/A 04/24/2022   Procedure: COLONOSCOPY WITH PROPOFOL;  Surgeon: [name], MD;  Location: [place];  Service: Gastroenterology;  Laterality: N/A;   DILATION AND EVACUATION N/A 10/22/2019   Procedure: ULTRASOUND GUIDED DILATATION AND EVACUATION;  Surgeon: [name], MD;  Location: [facility] LD [facility];  Service: Gynecology;  Laterality: N/A;   ESOPHAGOGASTRODUODENOSCOPY (EGD) WITH PROPOFOL N/A 04/24/2022   Procedure: ESOPHAGOGASTRODUODENOSCOPY (EGD) WITH PROPOFOL;  Surgeon: [name], MD;  Location: [place];  Service: Gastroenterology;  Laterality: N/A;   NO PAST SURGERIES     peritoneal  dialysis catheter insertion     RENAL BIOPSY     Social History:  reports that she has quit smoking. Her smoking use included cigars and cigarettes. She has a 2.00 pack-year smoking history. She has never used smokeless tobacco. She reports that she does not drink alcohol and does not use drugs.  Allergies  Allergen Reactions   Cantaloupe Extract Allergy Skin Test Itching    Mouth itching     Strawberry Extract Itching    Mouth itches   Citrullus Vulgaris Itching    Makes mouth itch ,  ALL melons    Nsaids Other (See Comments)    Avoid per nephrology Other reaction(s): Other Avoid per nephrology Avoid per nephrology    Family History  Adopted: Yes  Problem Relation Age of Onset   Heart disease Neg Hx     Prior to Admission medications   Medication Sig Start Date End Date Taking? Authorizing Provider  acetaminophen (TYLENOL) 500 MG tablet Take 1,000 mg by mouth every 6 (six) hours as needed for headache (pain).    [provider]  amitriptyline (ELAVIL) 10 MG tablet Take 1 tablet (10 mg total) by mouth at bedtime. 05/25/22   [name], MD  atorvastatin (LIPITOR) 40 MG tablet Take 40 mg by mouth daily.    [provider]  butalbital-acetaminophen-caffeine (FIORICET) 50-325-40 MG tablet Take 1 tablet by mouth daily as needed for headache.    [provider]  calcitRIOL (ROCALTROL) 0.25 MCG capsule Take 0.25 mcg by mouth every morning. 08/03/21   [provider]  calcium carbonate (TUMS - DOSED IN MG ELEMENTAL CALCIUM) 500 MG chewable tablet Take 2 tablets by mouth 3 (three) times daily with meals.    [provider]  carvedilol (COREG) 12.5 MG tablet Take 12.5 mg by mouth 2 (two) times daily with a meal.    [provider]  chlorpheniramine-HYDROcodone (TUSSIONEX) 10-8 MG/5ML Take 5 mLs by mouth every 12 (twelve) hours as needed for cough. 07/06/22   Deno Etienne, DO  clonazePAM (KLONOPIN) 0.5 MG tablet Take 1 tablet (0.5 mg total) by mouth 2 (two) times daily as needed for anxiety. Patient taking differently: Take 0.5 mg by mouth 2 (two) times daily. 03/16/22   [name], MD  ENTRESTO 97-103 MG Take 2 tablets by mouth daily. 04/03/22   [provider]  hydrALAZINE (APRESOLINE) 100 MG tablet Take 100 mg by mouth 3 (three) times daily. 08/05/21   [provider]  HYDROcodone bit-homatropine (HYCODAN) 5-1.5 MG/5ML syrup Take 5 mLs by mouth every 6 (six) hours as needed for cough. 07/06/22    Deno Etienne, DO  insulin lispro (HUMALOG) 100 UNIT/ML injection Inject 0.5 Units into the skin See admin instructions. Via Insulin Pump - 1/2 unit per hour 01/08/22   [provider]  isosorbide mononitrate (IMDUR) 30 MG 24 hr tablet Take 30 mg by mouth in the morning and at bedtime.    [provider]  magnesium oxide (MAG-OX) 400 MG tablet Take 400 mg by mouth 2 (two) times daily. 08/03/21   [provider]  methocarbamol (ROBAXIN) 500 MG tablet Take 500 mg by mouth every 8 (eight) hours as needed for muscle spasms.    [provider]  ondansetron (ZOFRAN) 4 MG tablet Take 1 tablet (4 mg total) by mouth every 6 (six) hours. Patient taking differently: Take 4 mg by mouth every 6 (six) hours as needed for nausea or vomiting. 04/11/22   [name], PA  pantoprazole (PROTONIX) 40 MG tablet Take 1 tablet (40 mg total) by mouth 2 (two) times daily before a meal. 04/11/22   Lemmon, Lavone Nian, PA  Vitamin D, Ergocalciferol, (DRISDOL) 1.25 MG (50000 UNIT) CAPS capsule Take 50,000 Units by mouth every Sunday.    [provider]  escitalopram (LEXAPRO) 10 MG tablet Take 20 mg by mouth daily.  05/10/20 10/06/20  [provider]  furosemide (LASIX) 40 MG tablet Take 1 tablet (40 mg total) by mouth daily. Patient not taking: No sig reported 10/28/19 02/17/21  [name], MD  lisinopril (ZESTRIL) 10 MG tablet Take 10 mg by mouth daily.  02/23/20 10/06/20  [provider]  spironolactone (ALDACTONE) 25 MG tablet Take 25 mg by mouth daily.  04/20/20 10/06/20  [provider]    Physical Exam: Vitals:   07/28/22 2045 07/28/22 2200 07/28/22 2235 07/29/22 0037  BP: (!) 162/125 (!) 133/106  (!) 143/112  Pulse: 83 92  96  Resp: 16 18  20   Temp: 98.4 F (36.9 C) 97.7 F (36.5 C)  97.7 F (36.5 C)  TempSrc:  Oral Oral Oral  SpO2: 98% 95%  100%  Weight:  60.4 kg    Height:  5' (1.524 m)     General:  Appears calm and comfortable and  is in NAD Eyes:  PERRL, EOMI, normal lids, iris ENT:  grossly normal hearing, lips & tongue, mmm; appropriate dentition Neck:  no LAD, masses or thyromegaly; no carotid bruits Cardiovascular:  RRR, no m/r/g. No LE edema.  Respiratory:   CTA bilaterally with no wheezes/rales/rhonchi.  Normal respiratory effort. Abdomen:  soft, NT, ND, NABS Back:   normal alignment, no CVAT Skin:  no rash or induration seen on limited exam Musculoskeletal:  grossly normal tone BUE/BLE, good ROM, no bony abnormality Lower extremity:  No LE edema.  Limited foot exam with no ulcerations.  2+ distal pulses. Psychiatric:  grossly normal mood and affect, speech fluent and appropriate, AOx3 Neurologic:  CN 2-12 grossly intact, moves all extremities in coordinated fashion, sensation intact   Radiological Exams on Admission: Independently reviewed - see discussion in A/P where applicable  DG Chest Portable 1 View  Result Date: 07/28/2022 CLINICAL DATA:  Cough EXAM: PORTABLE CHEST 1 VIEW COMPARISON:  Chest x-ray 11 1 2023.  Chest CT 11/22/2021. FINDINGS: The heart is enlarged, unchanged. The lungs are clear. There is no pleural effusion or pneumothorax. No acute fractures are identified. IMPRESSION: Stable cardiomegaly. No acute cardiopulmonary process. Electronically Signed   By: Ronney Asters M.D.   On: 07/28/2022 17:30    EKG: Independently reviewed.  NSR with rate 102; nonspecific ST changes with no evidence of acute ischemia. Prolonged QT    Labs on Admission: I have personally reviewed the available labs and imaging studies at the time of the admission.  Pertinent labs:   potassium: 6.7,  BUN; 100,  creatinine: 15.18,  AST: 62,  ALT: 54,  AG: 26,  Repeat potassium: 6.2, AG>20   Assessment and Plan: Principal Problem:   Hyperkalemia Active Problems:   End-stage renal disease on peritoneal dialysis with hyperkalemia   Intractable nausea and vomiting   Uncontrolled type 1 diabetes mellitus with  hyperglycemia, with long-term current use of insulin (HCC)   Metabolic acidosis, increased anion gap   Prolonged QT interval   Elevated LFTs   Essential hypertension   Chronic systolic CHF (congestive heart failure) (HCC)   Mixed diabetic hyperlipidemia associated with type 1 diabetes mellitus ([place])  Assessment and Plan: End-stage renal disease on peritoneal dialysis with hyperkalemia 26 year old presenting with 3 day history of intractable N/V found to have hyperkalemia to 6.7 in setting of ESRD on PD -obs to progressive -nephrology consulted and given lokelma and other medication -potassium trended down to 6.2 -nephrology to see tomorrow with plans for PD tomorrow  -no EKG changes -trend potassium  -hold entresto   Intractable nausea and vomiting Frequent hospitalization for N/V and abdominal pain with leaving AMA She has no pain today on exam and exam with non surgical/benign abdomen Holding down liquid and medication Recent CT abdo/pelvis 06/2022 with no acute finding Has had normal gastric emptying study  EGD 04/24/22 normal findings with normal biopsy and negative h.pylori Seen by GI ? Cyclic vomiting syndrome  Given zofran, not much benefit Hx of QT prolongation and has been on elavil/zofran with no EKG changes Will do trial of compazine overnight  Consider tigan at lower dose as well   Uncontrolled type 1 diabetes mellitus with hyperglycemia, with long-term current use of insulin (HCC) A1C 8.6. frequent hospital admit for DKA AG elevated, but BS of 75 in setting of ESRD  BS have been well controlled Insulin pump orders placed  CBG Q 4 hours  Tolerating liquids Monitor AG and BS     Metabolic acidosis, increased anion gap Blood sugars well controlled on her pump Elevated AG likely secondary to renal disease and do not think she is in DKA PD tomorrow, nephrology to see  Trend AG   Prolonged QT interval Baseline for her and has been on zofran outpatient and  elavil.  Optimize electrolytes Keep on telemetry Avoid qt prolonging drugs  Will hold elavil tonight   Elevated LFTs ? If secondary to intractable vomiting vs. Viral gastroenteritis vs volume from PD Trend, check hepatitis panel  Uptrends check [place]   Essential hypertension A bit labile Continue coreg 12.5mg  Bid and imdur QHS today Hold hydralazine 100mg  TID to start tomorrow and will need to hold if pressure on softer side.   Chronic systolic CHF (congestive heart failure) (HCC) Appears euvolemic Volume control per PD Echo in 10/2021: EF of 20%. Small pericardial effusion. Indeterminate diastolic function   Mixed diabetic hyperlipidemia associated with type 1 diabetes mellitus ([place]) Continue lipitor    Advance Care Planning:   Code Status: Full Code   Consults: nephrology   DVT Prophylaxis: heparin Grandfather  Family Communication: none   Severity of Illness: The appropriate patient status for this patient is OBSERVATION. Observation status is judged to be reasonable and necessary in order to provide the required intensity of service to ensure the patient's safety. The patient's presenting symptoms, physical exam findings, and initial radiographic and laboratory data in the context of their medical condition is felt to place them at decreased risk for further clinical deterioration. Furthermore, it is anticipated that the patient will be medically stable for discharge from the hospital within 2 midnights of admission.   Author: Orma Flaming, MD 07/29/2022 1:41 AM  For on call review [email or web address].

## 2022-07-28 NOTE — Assessment & Plan Note (Addendum)
Baseline for her and has been on zofran outpatient and elavil.  Optimize electrolytes Keep on telemetry Avoid qt prolonging drugs  Will hold elavil tonight

## 2022-07-29 DIAGNOSIS — N186 End stage renal disease: Secondary | ICD-10-CM | POA: Diagnosis not present

## 2022-07-29 DIAGNOSIS — Z992 Dependence on renal dialysis: Secondary | ICD-10-CM

## 2022-07-29 DIAGNOSIS — R112 Nausea with vomiting, unspecified: Secondary | ICD-10-CM

## 2022-07-29 DIAGNOSIS — E875 Hyperkalemia: Secondary | ICD-10-CM | POA: Diagnosis not present

## 2022-07-29 LAB — CBC
HCT: 36.5 % (ref 36.0–46.0)
Hemoglobin: 11.7 g/dL — ABNORMAL LOW (ref 12.0–15.0)
MCH: 30.8 pg (ref 26.0–34.0)
MCHC: 32.1 g/dL (ref 30.0–36.0)
MCV: 96.1 fL (ref 80.0–100.0)
Platelets: 353 10*3/uL (ref 150–400)
RBC: 3.8 MIL/uL — ABNORMAL LOW (ref 3.87–5.11)
RDW: 14.6 % (ref 11.5–15.5)
WBC: 4.9 10*3/uL (ref 4.0–10.5)
nRBC: 0.8 % — ABNORMAL HIGH (ref 0.0–0.2)

## 2022-07-29 LAB — COMPREHENSIVE METABOLIC PANEL
ALT: 50 U/L — ABNORMAL HIGH (ref 0–44)
AST: 41 U/L (ref 15–41)
Albumin: 2.7 g/dL — ABNORMAL LOW (ref 3.5–5.0)
Alkaline Phosphatase: 95 U/L (ref 38–126)
Anion gap: 21 — ABNORMAL HIGH (ref 5–15)
BUN: 101 mg/dL — ABNORMAL HIGH (ref 6–20)
CO2: 15 mmol/L — ABNORMAL LOW (ref 22–32)
Calcium: 7.8 mg/dL — ABNORMAL LOW (ref 8.9–10.3)
Chloride: 101 mmol/L (ref 98–111)
Creatinine, Ser: 15.47 mg/dL — ABNORMAL HIGH (ref 0.44–1.00)
GFR, Estimated: 3 mL/min — ABNORMAL LOW (ref >60)
Glucose, Bld: 107 mg/dL — ABNORMAL HIGH (ref 70–99)
Potassium: 5.8 mmol/L — ABNORMAL HIGH (ref 3.5–5.1)
Sodium: 137 mmol/L (ref 135–145)
Total Bilirubin: 0.9 mg/dL (ref 0.3–1.2)
Total Protein: 6 g/dL — ABNORMAL LOW (ref 6.5–8.1)

## 2022-07-29 LAB — GLUCOSE, CAPILLARY
Glucose-Capillary: 97 mg/dL (ref 70–99)
Glucose-Capillary: 98 mg/dL (ref 70–99)
Glucose-Capillary: 153 mg/dL — ABNORMAL HIGH (ref 70–99)

## 2022-07-29 LAB — HEPATITIS PANEL, ACUTE
HCV Ab: NONREACTIVE
Hep A IgM: NONREACTIVE
Hep B C IgM: NONREACTIVE
Hepatitis B Surface Ag: NONREACTIVE

## 2022-07-29 MED ORDER — FUROSEMIDE 10 MG/ML IJ SOLN
80.0000 mg | Freq: Once | INTRAMUSCULAR | Status: AC
  Administered 2022-07-29: 80 mg via INTRAVENOUS
  Filled 2022-07-29: qty 8

## 2022-07-29 MED ORDER — SODIUM ZIRCONIUM CYCLOSILICATE 10 G PO PACK
10.0000 g | PACK | Freq: Once | ORAL | Status: AC
  Administered 2022-07-29: 10 g via ORAL
  Filled 2022-07-29: qty 1

## 2022-07-29 NOTE — Plan of Care (Signed)
Patient was admitted overnight.  Called by primary team.  She is now tolerating PO.  She is stable for discharge from a renal standpoint if she is tolerating PO  Hyperkalemia - resolving.  Lasix 80 mg IV once now.  Redose lokelma now.  Will need to follow a low potassium diet at home   She should reach out to her home dialysis unit to let them know of her hospitalization so that they can set up repeat labs  Thank you,  Claudia Desanctis, MD 9:25 AM 07/29/2022

## 2022-07-29 NOTE — Assessment & Plan Note (Addendum)
Blood sugars well controlled on her pump Elevated AG likely secondary to renal disease and do not think she is in DKA PD tomorrow, nephrology to see  Trend AG

## 2022-07-29 NOTE — Discharge Summary (Signed)
Physician Discharge Summary  Kathy Lewis MBW:[id number] DOB: 24-Dec-1995 DOA: 07/28/2022  PCP: System, Provider Not In  Admit date: 07/28/2022 Discharge date: 07/29/2022  Admitted From: (Home) Disposition:  (Home )  Recommendations for Outpatient Follow-up:  Follow up with PCP in 1-2 weeks Please obtain BMP/CBC in one week Consider resuming back on Entresto if her potassium mains stable over few lab checks   Discharge Condition: (Stable) CODE STATUS: (FULL) Diet recommendation: Renal / Carb Modified  Brief/Interim Summary:   [name] is a 26 y.o. 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  Frequent hospitalizations for N/V and abdominal pain and DKA. Recent admit on 11/3 for N/V and abdominal pain. CT abdomen/pelvis at that time wnl. Left AMA on 11/4. Discharged before this on 11/1 for DKA and HTN urgency. Also left AMA on 10/31. EGD on 04/24/22: wnl with normal biopsies and negative h.pylori. gi thought related to cyclic vomiting syndrome. Also has had a normal gastric emptying study.     Hyperkalemia ESRD on PD -Patient workup was significant for potassium at 6.7, this is most likely due to diet noncompliance as she does report it with her PD at home, give [facility] yesterday, and this morning, repeat labs his potassium 5.8, I have discussed with nephrology, patient can be discharged home today after receiving an extra dose of Lokelma and IV Lasix, was counseled again about diet restriction, low potassium diet, and she will be given diet education material at time of discharge, she was instructed to continue her PD at home. -Will DC Entresto at time of discharge given  significant hyperkalemia, it can be considered to be resumed if she had few readings of normal potassium.  Nausea and vomiting - Recent CT abdo/pelvis 06/2022 with no acute finding - Has had normal gastric emptying study  - EGD 04/24/22 normal findings with normal biopsy and negative h.pylori - Seen by GI ? Cyclic vomiting syndrome  -Has resolved, tolerating a breakfast.  Expand All Collapse All   History and Physical      Patient: Kathy Lewis SVX:[id number] DOB: [birth year] DOA: 07/28/2022 DOS: the patient was seen and examined on 07/29/2022 PCP: System, Provider Not In  Patient coming from:  [facility]  - lives with her daughter      Chief Complaint: N/V x 3 days    HPI: Kathy Lewis is a 26 y.o. 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 She also states she has had a few episodes of loose stool. She states her vomiting has been intractable. She denies any abdominal pain. She does PD daily at home and last session was last night. She has been able to keep her medication and liquids down. She is no longer having material emesis, mainly dry heaving. She is on an insulin pump, denies any hypoglycemic events.    Frequent hospitalizations for N/V and abdominal pain and DKA. Recent admit on 11/3 for N/V and abdominal pain. CT abdomen/pelvis at that time wnl. Left AMA on 11/4. Discharged before this  on 11/1 for DKA and HTN urgency. Also left AMA on 10/31. EGD on 04/24/22: wnl with normal biopsies and negative h.pylori. gi thought related to cyclic vomiting syndrome. Also has had a normal gastric emptying study.    She does not smoke or drink alcohol.    Denies any fever/chills, vision changes/headaches, chest pain or palpitations, shortness of breath or cough, abdominal pain,  dysuria or leg swelling.      ER Course:  vitals: afebrile, bp: 141/102, HR: 101, RR: 18, oxygen: 100%RA Pertinent labs: potassium: 6.7, BUN; 100, creatinine: 15.18, AST: 62, ALT: 54, AG: 26, repeat bmp: 6.2, AG to 20  CXR: stable. No acute finding   In ED: nephrology consulted. Given 500cc bolus, lokelma, labetalol, novolog and dextrose, lasix 80mg  and calcium gluconate. Transferred to [facility] for admit     Review of Systems: As mentioned in the history of present illness. All other systems reviewed and are negative.     Past Medical History:  Diagnosis Date   Anemia     Anxiety     Bipolar 2 disorder (HCC)     Chronic kidney disease     Chronic systolic (congestive) heart failure (HCC)     Depression     DKA (diabetic ketoacidoses)     ESRD on peritoneal dialysis ([place])     HSV infection      on valtrex   Hypokalemia     Leukocytosis     Migraine     Noncompliance with medication regimen     Preeclampsia     Prolonged QT syndrome     Severe anemia     Type 1 diabetes mellitus ([place])           Past Surgical History:  Procedure Laterality Date   BIOPSY   04/24/2022    Procedure: BIOPSY;  Surgeon: [name], MD;  Location: [place];  Service: Gastroenterology;;   CARDIAC CATHETERIZATION       COLONOSCOPY WITH PROPOFOL N/A 04/24/2022    Procedure: COLONOSCOPY WITH PROPOFOL;  Surgeon: [name], MD;  Location: [place];  Service: Gastroenterology;  Laterality: N/A;   DILATION AND EVACUATION N/A 10/22/2019    Procedure: ULTRASOUND GUIDED DILATATION AND EVACUATION;  Surgeon: [name], MD;  Location: [facility];  Service: Gynecology;  Laterality: N/A;   ESOPHAGOGASTRODUODENOSCOPY (EGD) WITH PROPOFOL N/A 04/24/2022    Procedure: ESOPHAGOGASTRODUODENOSCOPY (EGD) WITH PROPOFOL;  Surgeon: [name], MD;  Location: [place];  Service: Gastroenterology;  Laterality: N/A;   NO PAST SURGERIES       peritoneal dialysis catheter insertion        RENAL BIOPSY        Social History:  reports that she has quit smoking. Her smoking use included cigars and cigarettes. She has a 2.00 pack-year smoking history. She has never used smokeless tobacco. She reports that she does not drink alcohol and does not use drugs.        Allergies  Allergen Reactions   Cantaloupe Extract Allergy Skin Test Itching      Mouth itching       Strawberry Extract Itching      Mouth itches   Citrullus Vulgaris Itching      Makes mouth itch , ALL melons     Nsaids Other (See Comments)      Avoid per nephrology Other reaction(s): Other Avoid per nephrology Avoid per nephrology           Family History  Adopted: Yes  Problem Relation Age of Onset   Heart disease Neg Hx               Prior to Admission medications   Medication Sig Start Date End Date Taking? Authorizing Provider  acetaminophen (TYLENOL) 500 MG tablet Take 1,000 mg by mouth every 6 (six) hours as needed for headache (pain).       [provider]  amitriptyline (ELAVIL) 10 MG tablet Take 1 tablet (10 mg total) by mouth at bedtime. 05/25/22     [name], MD  atorvastatin (LIPITOR) 40 MG tablet Take 40 mg by mouth daily.       [provider]  butalbital-acetaminophen-caffeine (FIORICET) 50-325-40 MG tablet Take 1 tablet by mouth daily as needed for headache.       [provider]  calcitRIOL (ROCALTROL) 0.25 MCG capsule Take 0.25 mcg by mouth every morning. 08/03/21     [provider]  calcium carbonate (TUMS - DOSED IN MG ELEMENTAL CALCIUM) 500 MG chewable tablet Take 2 tablets by mouth 3 (three) times daily with meals.       [provider]  carvedilol (COREG) 12.5 MG tablet Take 12.5 mg by mouth 2 (two) times daily with a meal.       [provider]  chlorpheniramine-HYDROcodone (TUSSIONEX) 10-8 MG/5ML Take 5 mLs by mouth every 12 (twelve) hours as needed for cough. 07/06/22     Deno Etienne, DO  clonazePAM (KLONOPIN) 0.5 MG  tablet Take 1 tablet (0.5 mg total) by mouth 2 (two) times daily as needed for anxiety. Patient taking differently: Take 0.5 mg by mouth 2 (two) times daily. 03/16/22     [name], MD  ENTRESTO 97-103 MG Take 2 tablets by mouth daily. 04/03/22     [provider]  hydrALAZINE (APRESOLINE) 100 MG tablet Take 100 mg by mouth 3 (three) times daily. 08/05/21     [provider]  HYDROcodone bit-homatropine (HYCODAN) 5-1.5 MG/5ML syrup Take 5 mLs by mouth every 6 (six) hours as needed for cough. 07/06/22     Deno Etienne, DO  insulin lispro (HUMALOG) 100 UNIT/ML injection Inject 0.5 Units into the skin See admin instructions. Via Insulin Pump - 1/2 unit per hour 01/08/22     [provider]  isosorbide mononitrate (IMDUR) 30 MG 24 hr tablet Take 30 mg by mouth in the morning and at bedtime.       [provider]  magnesium oxide (MAG-OX) 400 MG tablet Take 400 mg by mouth 2 (two) times daily. 08/03/21     [provider]  methocarbamol (ROBAXIN) 500 MG tablet Take 500 mg by mouth every 8 (eight) hours as needed for muscle spasms.       [provider]  ondansetron (ZOFRAN) 4 MG tablet Take 1 tablet (4 mg total) by mouth every 6 (six) hours. Patient taking differently: Take 4 mg by mouth every 6 (six) hours as needed for nausea or vomiting. 04/11/22     [name], PA  pantoprazole (PROTONIX) 40 MG tablet Take 1 tablet (40 mg total) by mouth 2 (two) times daily before a meal. 04/11/22     Lemmon, Lavone Nian, PA  Vitamin D, Ergocalciferol, (DRISDOL) 1.25 MG (50000 UNIT) CAPS capsule Take 50,000 Units by mouth every Sunday.       [provider]  escitalopram (LEXAPRO) 10 MG tablet Take 20 mg by mouth daily.  05/10/20 10/06/20   [provider]  furosemide (LASIX)  40 MG tablet Take 1 tablet (40 mg total) by mouth daily. Patient not taking: No sig reported 10/28/19 02/17/21   [name], MD  lisinopril (ZESTRIL) 10 MG tablet  Take 10 mg by mouth daily.  02/23/20 10/06/20   [provider]  spironolactone (ALDACTONE) 25 MG tablet Take 25 mg by mouth daily.  04/20/20 10/06/20   [provider]      Physical Exam:       Vitals:    07/28/22 2045 07/28/22 2200 07/28/22 2235 07/29/22 0037  BP: (!) 162/125 (!) 133/106   (!) 143/112  Pulse: 83 92   96  Resp: 16 18   20   Temp: 98.4 F (36.9 C) 97.7 F (36.5 C)   97.7 F (36.5 C)  TempSrc:   Oral Oral Oral  SpO2: 98% 95%   100%  Weight:   60.4 kg      Height:   5' (1.524 m)        General:  Appears calm and comfortable and is in NAD Eyes:  PERRL, EOMI, normal lids, iris ENT:  grossly normal hearing, lips & tongue, mmm; appropriate dentition Neck:  no LAD, masses or thyromegaly; no carotid bruits Cardiovascular:  RRR, no m/r/g. No LE edema.  Respiratory:   CTA bilaterally with no wheezes/rales/rhonchi.  Normal respiratory effort. Abdomen:  soft, NT, ND, NABS Back:   normal alignment, no CVAT Skin:  no rash or induration seen on limited exam Musculoskeletal:  grossly normal tone BUE/BLE, good ROM, no bony abnormality Lower extremity:  No LE edema.  Limited foot exam with no ulcerations.  2+ distal pulses. Psychiatric:  grossly normal mood and affect, speech fluent and appropriate, AOx3 Neurologic:  CN 2-12 grossly intact, moves all extremities in coordinated fashion, sensation intact     Radiological Exams on Admission: Independently reviewed - see discussion in A/P where applicable    Imaging Results (Last 48 hours)  DG Chest Portable 1 View   Result Date: 07/28/2022 CLINICAL DATA:  Cough EXAM: PORTABLE CHEST 1 VIEW COMPARISON:  Chest x-ray 11 1 2023.  Chest CT 11/22/2021. FINDINGS: The heart is enlarged, unchanged. The lungs are clear. There is no pleural effusion or pneumothorax. No acute fractures are identified. IMPRESSION: Stable cardiomegaly. No acute cardiopulmonary process. Electronically Signed   By: Ronney Asters M.D.   On: 07/28/2022  17:30       EKG: Independently reviewed.  NSR with rate 102; nonspecific ST changes with no evidence of acute ischemia. Prolonged QT      Labs on Admission: I have personally reviewed the available labs and imaging studies at the time of the admission.   Pertinent labs:   potassium: 6.7,  BUN; 100,  creatinine: 15.18,  AST: 62,  ALT: 54,  AG: 26,  Repeat potassium: 6.2, AG>20    Assessment and Plan: Principal Problem:   Hyperkalemia Active Problems:   End-stage renal disease on peritoneal dialysis with hyperkalemia   Intractable nausea and vomiting   Uncontrolled type 1 diabetes mellitus with hyperglycemia, with long-term current use of insulin (HCC)   Metabolic acidosis, increased anion gap   Prolonged QT interval   Elevated LFTs   Essential hypertension   Chronic systolic CHF (congestive heart failure) (HCC)   Mixed diabetic hyperlipidemia associated with type 1 diabetes mellitus (HCC)     Assessment and Plan: End-stage renal disease on peritoneal dialysis with hyperkalemia 26 year old presenting with 3 day history of intractable N/V found to have hyperkalemia to 6.7  in setting of ESRD on PD -obs to progressive -nephrology consulted and given lokelma and other medication -potassium trended down to 6.2 -nephrology to see tomorrow with plans for PD tomorrow  -no EKG changes -trend potassium  -hold entresto    Intractable nausea and vomiting Frequent hospitalization for N/V and abdominal pain with leaving AMA She has no pain today on exam and exam with non surgical/benign abdomen Holding down liquid and medication Recent CT abdo/pelvis 06/2022 with no acute finding Has had normal gastric emptying study  EGD 04/24/22 normal findings with normal biopsy and negative h.pylori Seen by GI ? Cyclic vomiting syndrome  Given zofran, not much benefit Hx of QT prolongation and has been on elavil/zofran with no EKG changes Will do trial of compazine overnight  Consider tigan  at lower dose as well    Uncontrolled type 1 diabetes mellitus with hyperglycemia, with long-term current use of insulin (HCC) A1C 8.6. frequent hospital admit for DKA   Metabolic acidosis, increased anion gap Blood sugars well controlled on her pump  Prolonged QT interval Baseline for her and has been on zofran outpatient and elavil.  Optimize electrolytes Keep on telemetry Avoid qt prolonging drugs    Elevated LFTs -Negative hepatitis panel   Essential hypertension Continue with home medications    Chronic systolic CHF (congestive heart failure) (HCC) Appears euvolemic Volume control per PD Echo in 10/2021: EF of 20%. Small pericardial effusion. Indeterminate diastolic function  -Continue with Coreg, hydralazine and Imdur, but will have to discontinue [place] given potassium 6.7 on admission.    Mixed diabetic hyperlipidemia associated with type 1 diabetes mellitus ([place]) Continue lipitor        Discharge Diagnoses:  Principal Problem:   Hyperkalemia Active Problems:   End-stage renal disease on peritoneal dialysis with hyperkalemia   Intractable nausea and vomiting   Uncontrolled type 1 diabetes mellitus with hyperglycemia, with long-term current use of insulin (HCC)   Metabolic acidosis, increased anion gap   Prolonged QT interval   Elevated LFTs   Essential hypertension   Chronic systolic CHF (congestive heart failure) (HCC)   Mixed diabetic hyperlipidemia associated with type 1 diabetes mellitus ([place])    Discharge Instructions  Discharge Instructions     Diet - low sodium heart healthy   Complete by: As directed    Discharge instructions   Complete by: As directed    Follow with Primary MD System, Provider Not In in 7 days   Get CBC, CMP,  checked  by Primary MD next visit.    Activity: As tolerated with Full fall precautions use walker/cane & assistance as needed   Disposition Home    Diet: RENAL DIET  On your next visit with your primary care  physician please Get Medicines reviewed and adjusted.   Please request your Prim.MD to go over all Hospital Tests and Procedure/Radiological results at the follow up, please get all Hospital records sent to your Prim MD by signing hospital release before you go home.   If you experience worsening of your admission symptoms, develop shortness of breath, life threatening emergency, suicidal or homicidal thoughts you must seek medical attention immediately by calling 911 or calling your MD immediately  if symptoms less severe.  You Must read complete instructions/literature along with all the possible adverse reactions/side effects for all the Medicines you take and that have been prescribed to you. Take any new Medicines after you have completely understood and accpet all the possible adverse reactions/side effects.   Do  not drive, operating heavy machinery, perform activities at heights, swimming or participation in water activities or provide baby sitting services if your were admitted for syncope or siezures until you have seen by Primary MD or a Neurologist and advised to do so again.  Do not drive when taking Pain medications.    Do not take more than prescribed Pain, Sleep and Anxiety Medications  Special Instructions: If you have smoked or chewed Tobacco  in the last 2 yrs please stop smoking, stop any regular Alcohol  and or any Recreational drug use.  Wear Seat belts while driving.   Please note  You were cared for by a hospitalist during your hospital stay. If you have any questions about your discharge medications or the care you received while you were in the hospital after you are discharged, you can call the unit and asked to speak with the hospitalist on call if the hospitalist that took care of you is not available. Once you are discharged, your primary care physician will handle any further medical issues. Please note that NO REFILLS for any discharge medications will be  authorized once you are discharged, as it is imperative that you return to your primary care physician (or establish a relationship with a primary care physician if you do not have one) for your aftercare needs so that they can reassess your need for medications and monitor your lab values.   Increase activity slowly   Complete by: As directed       Allergies as of 07/29/2022       Reactions   Cantaloupe Extract Allergy Skin Test Itching   Mouth itching   Strawberry Extract Itching   Mouth itches   Citrullus Vulgaris Itching   Makes mouth itch , ALL melons   Nsaids Other (See Comments)   Avoid per nephrology Other reaction(s): Other Avoid per nephrology Avoid per nephrology        Medication List     STOP taking these medications    Entresto 97-103 MG Generic drug: sacubitril-valsartan       TAKE these medications    acetaminophen 500 MG tablet Commonly known as: TYLENOL Take 1,000 mg by mouth every 6 (six) hours as needed for headache (pain).   amitriptyline 10 MG tablet Commonly known as: ELAVIL Take 1 tablet (10 mg total) by mouth at bedtime.   atorvastatin 40 MG tablet Commonly known as: LIPITOR Take 40 mg by mouth daily.   butalbital-acetaminophen-caffeine 50-325-40 MG tablet Commonly known as: FIORICET Take 1 tablet by mouth daily as needed for headache.   calcitRIOL 0.25 MCG capsule Commonly known as: ROCALTROL Take 0.25 mcg by mouth every morning.   calcium carbonate 500 MG chewable tablet Commonly known as: TUMS - dosed in mg elemental calcium Take 2 tablets by mouth 3 (three) times daily with meals.   carvedilol 12.5 MG tablet Commonly known as: COREG Take 12.5 mg by mouth 2 (two) times daily with a meal.   chlorpheniramine-HYDROcodone 10-8 MG/5ML Commonly known as: TUSSIONEX Take 5 mLs by mouth every 12 (twelve) hours as needed for cough.   clonazePAM 0.5 MG tablet Commonly known as: KLONOPIN Take 1 tablet (0.5 mg total) by mouth 2  (two) times daily as needed for anxiety.   hydrALAZINE 100 MG tablet Commonly known as: APRESOLINE Take 100 mg by mouth 3 (three) times daily.   HYDROcodone bit-homatropine 5-1.5 MG/5ML syrup Commonly known as: HYCODAN Take 5 mLs by mouth every 6 (six) hours as needed for  cough.   insulin lispro 100 UNIT/ML injection Commonly known as: HUMALOG Inject 0.5 Units into the skin See admin instructions. Via Insulin Pump - 1/2 unit per hour   isosorbide mononitrate 30 MG 24 hr tablet Commonly known as: IMDUR Take 30 mg by mouth in the morning and at bedtime.   magnesium oxide 400 MG tablet Commonly known as: MAG-OX Take 400 mg by mouth 2 (two) times daily.   methocarbamol 500 MG tablet Commonly known as: ROBAXIN Take 500 mg by mouth every 8 (eight) hours as needed for muscle spasms.   ondansetron 4 MG tablet Commonly known as: ZOFRAN Take 1 tablet (4 mg total) by mouth every 6 (six) hours. What changed:  when to take this reasons to take this   pantoprazole 40 MG tablet Commonly known as: Protonix Take 1 tablet (40 mg total) by mouth 2 (two) times daily before a meal.   Vitamin D (Ergocalciferol) 1.25 MG (50000 UNIT) Caps capsule Commonly known as: DRISDOL Take 50,000 Units by mouth every Sunday.        Allergies  Allergen Reactions   Cantaloupe Extract Allergy Skin Test Itching    Mouth itching     Strawberry Extract Itching    Mouth itches   Citrullus Vulgaris Itching    Makes mouth itch , ALL melons    Nsaids Other (See Comments)    Avoid per nephrology Other reaction(s): Other Avoid per nephrology Avoid per nephrology    Consultations: D/W renal   Procedures/Studies: DG Chest Portable 1 View  Result Date: 07/28/2022 CLINICAL DATA:  Cough EXAM: PORTABLE CHEST 1 VIEW COMPARISON:  Chest x-ray 11 1 2023.  Chest CT 11/22/2021. FINDINGS: The heart is enlarged, unchanged. The lungs are clear. There is no pleural effusion or pneumothorax. No acute fractures  are identified. IMPRESSION: Stable cardiomegaly. No acute cardiopulmonary process. Electronically Signed   By: Ronney Asters M.D.   On: 07/28/2022 17:30   CT ABDOMEN PELVIS WO CONTRAST  Result Date: 06/30/2022 CLINICAL DATA:  Abdominal pain with nausea and vomiting EXAM: CT ABDOMEN AND PELVIS WITHOUT CONTRAST TECHNIQUE: Multidetector CT imaging of the abdomen and pelvis was performed following the standard protocol without IV contrast. RADIATION DOSE REDUCTION: This exam was performed according to the departmental dose-optimization program which includes automated exposure control, adjustment of the mA and/or kV according to patient size and/or use of iterative reconstruction technique. COMPARISON:  CT 04/26/2022 FINDINGS: Lower chest: Cardiomegaly. Moderate pericardial effusion, unchanged. Left basilar atelectasis/scarring. Mild interlobular septal thickening likely due to edema. Hepatobiliary: Hepatomegaly. No gallstones, gallbladder wall thickening, or biliary dilation. Pancreas: Unremarkable. No pancreatic ductal dilatation or surrounding inflammatory changes. Spleen: Normal in size without focal abnormality. Adrenals/Urinary Tract: Unremarkable adrenal glands. Normal noncontrast appearance of the kidneys. No urinary calculi or hydronephrosis. Unremarkable bladder. Peritoneal dialysis catheter tip in the left lower quadrant. Stomach/Bowel: Dense material outlines the rugal folds in the stomach. Stomach is otherwise unremarkable. Normal caliber large and small bowel. Normal appendix. Vascular/Lymphatic: No significant vascular findings are present. No enlarged abdominal or pelvic lymph nodes. Reproductive: Uterus and bilateral adnexa are unremarkable. Other: Small volume abdominopelvic ascites. No free intraperitoneal air. Musculoskeletal: No acute osseous abnormality.  Body wall edema. IMPRESSION: No acute abnormality in the abdomen or pelvis. Hepatomegaly. Peritoneal dialysis catheter tip in the left lower  quadrant. Mild abdominopelvic ascites. Body wall anasarca. Cardiomegaly and moderate pericardial effusion unchanged from 04/26/2022. Mild interstitial edema in the lower lungs. Electronically Signed   By: Carroll Kinds.D.  On: 06/30/2022 22:55   (Echo, Carotid, EGD, Colonoscopy, ERCP)    Subjective:  She denies any nausea, vomiting this morning, she tolerated her breakfast very well, eager to go home today. Discharge Exam: Vitals:   07/29/22 0424 07/29/22 0742  BP: 105/71 114/89  Pulse: 89 79  Resp: 20 16  Temp: 97.7 F (36.5 C) (!) 97.4 F (36.3 C)  SpO2: 92% 94%   Vitals:   07/28/22 2235 07/29/22 0037 07/29/22 0424 07/29/22 0742  BP:  (!) 143/112 105/71 114/89  Pulse:  96 89 79  Resp:  20 20 16   Temp:  97.7 F (36.5 C) 97.7 F (36.5 C) (!) 97.4 F (36.3 C)  TempSrc: Oral Oral Other (Comment) Oral  SpO2:  100% 92% 94%  Weight:   61.8 kg   Height:        General: Pt is alert, awake, not in acute distress Cardiovascular: RRR, S1/S2 +, no rubs, no gallops Respiratory: CTA bilaterally, no wheezing, no rhonchi Abdominal: Soft, NT, ND, bowel sounds + Extremities: no edema, no cyanosis    The results of significant diagnostics from this hospitalization (including imaging, microbiology, ancillary and laboratory) are listed below for reference.     Microbiology: Recent Results (from the past 240 hour(s))  Resp Panel by RT-PCR (Flu A&B, Covid) Anterior Nasal Swab     Status: None   Collection Time: 07/28/22  3:11 PM   Specimen: Anterior Nasal Swab  Result Value Ref Range Status   SARS Coronavirus 2 by RT PCR NEGATIVE NEGATIVE Final    Comment: (NOTE) SARS-CoV-2 target nucleic acids are NOT DETECTED.  The SARS-CoV-2 RNA is generally detectable in upper respiratory specimens during the acute phase of infection. The lowest concentration of SARS-CoV-2 viral copies this assay can detect is 138 copies/mL. A negative result does not preclude SARS-Cov-2 infection and  should not be used as the sole basis for treatment or other patient management decisions. A negative result may occur with  improper specimen collection/handling, submission of specimen other than nasopharyngeal swab, presence of viral mutation(s) within the areas targeted by this assay, and inadequate number of viral copies(<138 copies/mL). A negative result must be combined with clinical observations, patient history, and epidemiological information. The expected result is Negative.  Fact Sheet for Patients:  EntrepreneurPulse.com.au  Fact Sheet for Healthcare Providers:  IncredibleEmployment.be  This test is no t yet approved or cleared by the [place] FDA and  has been authorized for detection and/or diagnosis of SARS-CoV-2 by FDA under an Emergency Use Authorization (EUA). This EUA will remain  in effect (meaning this test can be used) for the duration of the COVID-19 declaration under Section 564(b)(1) of the Act, 21 U.S.C.section 360bbb-3(b)(1), unless the authorization is terminated  or revoked sooner.       Influenza A by PCR NEGATIVE NEGATIVE Final   Influenza B by PCR NEGATIVE NEGATIVE Final    Comment: (NOTE) The Xpert Xpress SARS-CoV-2/FLU/RSV plus assay is intended as an aid in the diagnosis of influenza from Nasopharyngeal swab specimens and should not be used as a sole basis for treatment. Nasal washings and aspirates are unacceptable for Xpert Xpress SARS-CoV-2/FLU/RSV testing.  Fact Sheet for Patients: EntrepreneurPulse.com.au  Fact Sheet for Healthcare Providers: IncredibleEmployment.be  This test is not yet approved or cleared by the [place] FDA and has been authorized for detection and/or diagnosis of SARS-CoV-2 by FDA under an Emergency Use Authorization (EUA). This EUA will remain in effect (meaning this test can be used) for  the duration of the COVID-19 declaration  under Section 564(b)(1) of the Act, 21 U.S.C. section 360bbb-3(b)(1), unless the authorization is terminated or revoked.  Performed at [facility], [address] [place], [place] [address]      Labs: BNP (last 3 results) Recent Labs    08/11/21 1143 11/22/21 1027 06/27/22 1730  BNP 1,278.5* 2,727.2* >2,585.2*   Basic Metabolic Panel: Recent Labs  Lab 07/28/22 1547 07/28/22 1909 07/29/22 0044  NA 140 134* 137  K 6.7* 6.2* 5.8*  CL 97* 100 101  CO2 17* 14* 15*  GLUCOSE 86 159* 107*  BUN 100* 97* 101*  CREATININE 15.18* 14.38* 15.47*  CALCIUM 7.5* 7.2* 7.8*  MG 2.5*  --   --    Liver Function Tests: Recent Labs  Lab 07/28/22 1547 07/29/22 0044  AST 62* 41  ALT 54* 50*  ALKPHOS 119 95  BILITOT 0.9 0.9  PROT 6.8 6.0*  ALBUMIN 3.0* 2.7*   Recent Labs  Lab 07/28/22 1547  LIPASE 21   No results for input(s): "AMMONIA" in the last 168 hours. CBC: Recent Labs  Lab 07/28/22 1547 07/29/22 0044  WBC 4.7 4.9  HGB 12.7 11.7*  HCT 38.5 36.5  MCV 94.6 96.1  PLT 352 353   Cardiac Enzymes: No results for input(s): "CKTOTAL", "CKMB", "CKMBINDEX", "TROPONINI" in the last 168 hours. BNP: Invalid input(s): "POCBNP" CBG: Recent Labs  Lab 07/28/22 2030 07/28/22 2311 07/29/22 0421 07/29/22 0738 07/29/22 1142  GLUCAP 140* 133* 97 98 153*   D-Dimer No results for input(s): "DDIMER" in the last 72 hours. Hgb A1c No results for input(s): "HGBA1C" in the last 72 hours. Lipid Profile No results for input(s): "CHOL", "HDL", "LDLCALC", "TRIG", "CHOLHDL", "LDLDIRECT" in the last 72 hours. Thyroid function studies No results for input(s): "TSH", "T4TOTAL", "T3FREE", "THYROIDAB" in the last 72 hours.  Invalid input(s): "FREET3" Anemia work up No results for input(s): "VITAMINB12", "FOLATE", "FERRITIN", "TIBC", "IRON", "RETICCTPCT" in the last 72 hours. Urinalysis    Component Value Date/Time   COLORURINE YELLOW 07/28/2022 1503   APPEARANCEUR CLEAR  07/28/2022 1503   LABSPEC 1.025 07/28/2022 1503   PHURINE 5.5 07/28/2022 1503   GLUCOSEU NEGATIVE 07/28/2022 1503   HGBUR MODERATE (A) 07/28/2022 1503   BILIRUBINUR NEGATIVE 07/28/2022 1503   KETONESUR NEGATIVE 07/28/2022 1503   PROTEINUR >=300 (A) 07/28/2022 1503   UROBILINOGEN 0.2 12/15/2015 1736   NITRITE NEGATIVE 07/28/2022 1503   LEUKOCYTESUR NEGATIVE 07/28/2022 1503   Sepsis Labs Recent Labs  Lab 07/28/22 1547 07/29/22 0044  WBC 4.7 4.9   Microbiology Recent Results (from the past 240 hour(s))  Resp Panel by RT-PCR (Flu A&B, Covid) Anterior Nasal Swab     Status: None   Collection Time: 07/28/22  3:11 PM   Specimen: Anterior Nasal Swab  Result Value Ref Range Status   SARS Coronavirus 2 by RT PCR NEGATIVE NEGATIVE Final    Comment: (NOTE) SARS-CoV-2 target nucleic acids are NOT DETECTED.  The SARS-CoV-2 RNA is generally detectable in upper respiratory specimens during the acute phase of infection. The lowest concentration of SARS-CoV-2 viral copies this assay can detect is 138 copies/mL. A negative result does not preclude SARS-Cov-2 infection and should not be used as the sole basis for treatment or other patient management decisions. A negative result may occur with  improper specimen collection/handling, submission of specimen other than nasopharyngeal swab, presence of viral mutation(s) within the areas targeted by this assay, and inadequate number of viral copies(<138 copies/mL). A  negative result must be combined with clinical observations, patient history, and epidemiological information. The expected result is Negative.  Fact Sheet for Patients:  EntrepreneurPulse.com.au  Fact Sheet for Healthcare Providers:  IncredibleEmployment.be  This test is no t yet approved or cleared by the [place] FDA and  has been authorized for detection and/or diagnosis of SARS-CoV-2 by FDA under an Emergency Use Authorization (EUA).  This EUA will remain  in effect (meaning this test can be used) for the duration of the COVID-19 declaration under Section 564(b)(1) of the Act, 21 U.S.C.section 360bbb-3(b)(1), unless the authorization is terminated  or revoked sooner.       Influenza A by PCR NEGATIVE NEGATIVE Final   Influenza B by PCR NEGATIVE NEGATIVE Final    Comment: (NOTE) The Xpert Xpress SARS-CoV-2/FLU/RSV plus assay is intended as an aid in the diagnosis of influenza from Nasopharyngeal swab specimens and should not be used as a sole basis for treatment. Nasal washings and aspirates are unacceptable for Xpert Xpress SARS-CoV-2/FLU/RSV testing.  Fact Sheet for Patients: EntrepreneurPulse.com.au  Fact Sheet for Healthcare Providers: IncredibleEmployment.be  This test is not yet approved or cleared by the [place] FDA and has been authorized for detection and/or diagnosis of SARS-CoV-2 by FDA under an Emergency Use Authorization (EUA). This EUA will remain in effect (meaning this test can be used) for the duration of the COVID-19 declaration under Section 564(b)(1) of the Act, 21 U.S.C. section 360bbb-3(b)(1), unless the authorization is terminated or revoked.  Performed at [facility], [address] [place], [place] [address]      Time coordinating discharge: Over 30 minutes  SIGNED:   Phillips Climes, MD  [facility] 07/29/2022, 12:49 PM Pager   If 7PM-7AM, please contact night-coverage [email or web address]

## 2022-07-29 NOTE — Assessment & Plan Note (Signed)
A1C 8.6. frequent hospital admit for DKA AG elevated, but BS of 75 in setting of ESRD  BS have been well controlled Insulin pump orders placed  CBG Q 4 hours  Tolerating liquids Monitor AG and BS

## 2022-07-29 NOTE — Progress Notes (Signed)
Nursing Discharge Note   Admit Date: 07/28/2022  Discharge date: 07/29/2022  Kathy Lewis is to be discharged home per MD order.  AVS completed. Reviewed with patient and family at bedside. Highlighted copy provided for patient to take home.  Patient able to verbalize understanding of discharge instructions. PIV removed. Patient stable upon discharge.   Discharge Instructions     Diet - low sodium heart healthy   Complete by: As directed    Discharge instructions   Complete by: As directed    Follow with Primary MD System, Provider Not In in 7 days   Get CBC, CMP,  checked  by Primary MD next visit.    Activity: As tolerated with Full fall precautions use walker/cane & assistance as needed   Disposition Home    Diet: RENAL DIET  On your next visit with your primary care physician please Get Medicines reviewed and adjusted.   Please request your Prim.MD to go over all Hospital Tests and Procedure/Radiological results at the follow up, please get all Hospital records sent to your Prim MD by signing hospital release before you go home.   If you experience worsening of your admission symptoms, develop shortness of breath, life threatening emergency, suicidal or homicidal thoughts you must seek medical attention immediately by calling 911 or calling your MD immediately  if symptoms less severe.  You Must read complete instructions/literature along with all the possible adverse reactions/side effects for all the Medicines you take and that have been prescribed to you. Take any new Medicines after you have completely understood and accpet all the possible adverse reactions/side effects.   Do not drive, operating heavy machinery, perform activities at heights, swimming or participation in water activities or provide baby sitting services if your were admitted for syncope or siezures until you have seen by Primary MD or a Neurologist and advised to do so again.  Do not drive when taking  Pain medications.    Do not take more than prescribed Pain, Sleep and Anxiety Medications  Special Instructions: If you have smoked or chewed Tobacco  in the last 2 yrs please stop smoking, stop any regular Alcohol  and or any Recreational drug use.  Wear Seat belts while driving.   Please note  You were cared for by a hospitalist during your hospital stay. If you have any questions about your discharge medications or the care you received while you were in the hospital after you are discharged, you can call the unit and asked to speak with the hospitalist on call if the hospitalist that took care of you is not available. Once you are discharged, your primary care physician will handle any further medical issues. Please note that NO REFILLS for any discharge medications will be authorized once you are discharged, as it is imperative that you return to your primary care physician (or establish a relationship with a primary care physician if you do not have one) for your aftercare needs so that they can reassess your need for medications and monitor your lab values.   Increase activity slowly   Complete by: As directed          Allergies as of 07/29/2022       Reactions   Cantaloupe Extract Allergy Skin Test Itching   Mouth itching   Strawberry Extract Itching   Mouth itches   Citrullus Vulgaris Itching   Makes mouth itch , ALL melons   Nsaids Other (See Comments)   Avoid per nephrology Other  reaction(s): Other Avoid per nephrology Avoid per nephrology        Medication List     STOP taking these medications    Entresto 97-103 MG Generic drug: sacubitril-valsartan       TAKE these medications    acetaminophen 500 MG tablet Commonly known as: TYLENOL Take 1,000 mg by mouth every 6 (six) hours as needed for headache (pain).   amitriptyline 10 MG tablet Commonly known as: ELAVIL Take 1 tablet (10 mg total) by mouth at bedtime.   atorvastatin 40 MG tablet Commonly  known as: LIPITOR Take 40 mg by mouth daily.   butalbital-acetaminophen-caffeine 50-325-40 MG tablet Commonly known as: FIORICET Take 1 tablet by mouth daily as needed for headache.   calcitRIOL 0.25 MCG capsule Commonly known as: ROCALTROL Take 0.25 mcg by mouth every morning.   calcium carbonate 500 MG chewable tablet Commonly known as: TUMS - dosed in mg elemental calcium Take 2 tablets by mouth 3 (three) times daily with meals.   carvedilol 12.5 MG tablet Commonly known as: COREG Take 12.5 mg by mouth 2 (two) times daily with a meal.   chlorpheniramine-HYDROcodone 10-8 MG/5ML Commonly known as: TUSSIONEX Take 5 mLs by mouth every 12 (twelve) hours as needed for cough.   clonazePAM 0.5 MG tablet Commonly known as: KLONOPIN Take 1 tablet (0.5 mg total) by mouth 2 (two) times daily as needed for anxiety.   hydrALAZINE 100 MG tablet Commonly known as: APRESOLINE Take 100 mg by mouth 3 (three) times daily.   HYDROcodone bit-homatropine 5-1.5 MG/5ML syrup Commonly known as: HYCODAN Take 5 mLs by mouth every 6 (six) hours as needed for cough.   insulin lispro 100 UNIT/ML injection Commonly known as: HUMALOG Inject 0.5 Units into the skin See admin instructions. Via Insulin Pump - 1/2 unit per hour   isosorbide mononitrate 30 MG 24 hr tablet Commonly known as: IMDUR Take 30 mg by mouth in the morning and at bedtime.   magnesium oxide 400 MG tablet Commonly known as: MAG-OX Take 400 mg by mouth 2 (two) times daily.   methocarbamol 500 MG tablet Commonly known as: ROBAXIN Take 500 mg by mouth every 8 (eight) hours as needed for muscle spasms.   ondansetron 4 MG tablet Commonly known as: ZOFRAN Take 1 tablet (4 mg total) by mouth every 6 (six) hours. What changed:  when to take this reasons to take this   pantoprazole 40 MG tablet Commonly known as: Protonix Take 1 tablet (40 mg total) by mouth 2 (two) times daily before a meal.   Vitamin D (Ergocalciferol) 1.25  MG (50000 UNIT) Caps capsule Commonly known as: DRISDOL Take 50,000 Units by mouth every Sunday.         Discharge Instructions/ Education: Discharge instructions given to patient with verbalized understanding. Discharge education completed with patient/family including: follow up instructions, medication list, discharge activities, and limitations if indicated. Patient instructed to return to Emergency Department, call 911, or call MD for any changes in condition.  Patient ambulated and escorted to lobby and discharged home via private automobile.

## 2022-07-29 NOTE — Assessment & Plan Note (Signed)
26 year old presenting with 3 day history of intractable N/V found to have hyperkalemia to 6.7 in setting of ESRD on PD -obs to progressive -nephrology consulted and given lokelma and other medication -potassium trended down to 6.2 -nephrology to see tomorrow with plans for PD tomorrow  -no EKG changes -trend potassium  -hold entresto

## 2022-07-29 NOTE — Assessment & Plan Note (Signed)
Frequent hospitalization for N/V and abdominal pain with leaving AMA She has no pain today on exam and exam with non surgical/benign abdomen Holding down liquid and medication Recent CT abdo/pelvis 06/2022 with no acute finding Has had normal gastric emptying study  EGD 04/24/22 normal findings with normal biopsy and negative h.pylori Seen by GI ? Cyclic vomiting syndrome  Given zofran, not much benefit Hx of QT prolongation and has been on elavil/zofran with no EKG changes Will do trial of compazine overnight  Consider tigan at lower dose as well

## 2022-07-29 NOTE — Assessment & Plan Note (Signed)
?   If secondary to intractable vomiting vs. Viral gastroenteritis vs volume from PD Trend, check hepatitis panel  Uptrends check [place]

## 2022-07-29 NOTE — Discharge Instructions (Signed)
Follow with Primary MD System, Provider Not In in 7 days   Get CBC, CMP,  checked  by Primary MD next visit.    Activity: As tolerated with Full fall precautions use walker/cane & assistance as needed   Disposition Home    Diet: RENAL DIET  On your next visit with your primary care physician please Get Medicines reviewed and adjusted.   Please request your Prim.MD to go over all Hospital Tests and Procedure/Radiological results at the follow up, please get all Hospital records sent to your Prim MD by signing hospital release before you go home.   If you experience worsening of your admission symptoms, develop shortness of breath, life threatening emergency, suicidal or homicidal thoughts you must seek medical attention immediately by calling 911 or calling your MD immediately  if symptoms less severe.  You Must read complete instructions/literature along with all the possible adverse reactions/side effects for all the Medicines you take and that have been prescribed to you. Take any new Medicines after you have completely understood and accpet all the possible adverse reactions/side effects.   Do not drive, operating heavy machinery, perform activities at heights, swimming or participation in water activities or provide baby sitting services if your were admitted for syncope or siezures until you have seen by Primary MD or a Neurologist and advised to do so again.  Do not drive when taking Pain medications.    Do not take more than prescribed Pain, Sleep and Anxiety Medications  Special Instructions: If you have smoked or chewed Tobacco  in the last 2 yrs please stop smoking, stop any regular Alcohol  and or any Recreational drug use.  Wear Seat belts while driving.   Please note  You were cared for by a hospitalist during your hospital stay. If you have any questions about your discharge medications or the care you received while you were in the hospital after you are  discharged, you can call the unit and asked to speak with the hospitalist on call if the hospitalist that took care of you is not available. Once you are discharged, your primary care physician will handle any further medical issues. Please note that NO REFILLS for any discharge medications will be authorized once you are discharged, as it is imperative that you return to your primary care physician (or establish a relationship with a primary care physician if you do not have one) for your aftercare needs so that they can reassess your need for medications and monitor your lab values.

## 2022-08-10 ENCOUNTER — Other Ambulatory Visit: Payer: Self-pay

## 2022-08-10 ENCOUNTER — Emergency Department (HOSPITAL_COMMUNITY)
Admission: EM | Admit: 2022-08-10 | Discharge: 2022-08-11 | Disposition: A | Discharge status: Home / Self Care | Payer: Medicaid Other | Attending: Emergency Medicine | Admitting: Emergency Medicine

## 2022-08-10 DIAGNOSIS — E875 Hyperkalemia: Secondary | ICD-10-CM | POA: Diagnosis not present

## 2022-08-10 DIAGNOSIS — I120 Hypertensive chronic kidney disease with stage 5 chronic kidney disease or end stage renal disease: Secondary | ICD-10-CM | POA: Insufficient documentation

## 2022-08-10 DIAGNOSIS — R1013 Epigastric pain: Secondary | ICD-10-CM | POA: Diagnosis present

## 2022-08-10 DIAGNOSIS — Z992 Dependence on renal dialysis: Secondary | ICD-10-CM | POA: Diagnosis not present

## 2022-08-10 DIAGNOSIS — K2900 Acute gastritis without bleeding: Secondary | ICD-10-CM | POA: Diagnosis not present

## 2022-08-10 DIAGNOSIS — E1022 Type 1 diabetes mellitus with diabetic chronic kidney disease: Secondary | ICD-10-CM | POA: Diagnosis not present

## 2022-08-10 DIAGNOSIS — R0789 Other chest pain: Secondary | ICD-10-CM | POA: Diagnosis not present

## 2022-08-10 DIAGNOSIS — N186 End stage renal disease: Secondary | ICD-10-CM | POA: Insufficient documentation

## 2022-08-10 DIAGNOSIS — N9489 Other specified conditions associated with female genital organs and menstrual cycle: Secondary | ICD-10-CM | POA: Insufficient documentation

## 2022-08-10 DIAGNOSIS — E1065 Type 1 diabetes mellitus with hyperglycemia: Secondary | ICD-10-CM | POA: Insufficient documentation

## 2022-08-10 DIAGNOSIS — Z1152 Encounter for screening for COVID-19: Secondary | ICD-10-CM | POA: Insufficient documentation

## 2022-08-10 DIAGNOSIS — R000 Tachycardia, unspecified: Secondary | ICD-10-CM | POA: Insufficient documentation

## 2022-08-10 DIAGNOSIS — Z794 Long term (current) use of insulin: Secondary | ICD-10-CM | POA: Insufficient documentation

## 2022-08-10 DIAGNOSIS — D649 Anemia, unspecified: Secondary | ICD-10-CM | POA: Diagnosis not present

## 2022-08-10 DIAGNOSIS — I132 Hypertensive heart and chronic kidney disease with heart failure and with stage 5 chronic kidney disease, or end stage renal disease: Secondary | ICD-10-CM | POA: Insufficient documentation

## 2022-08-10 DIAGNOSIS — I509 Heart failure, unspecified: Secondary | ICD-10-CM | POA: Diagnosis not present

## 2022-08-10 DIAGNOSIS — R079 Chest pain, unspecified: Secondary | ICD-10-CM

## 2022-08-10 DIAGNOSIS — Z79899 Other long term (current) drug therapy: Secondary | ICD-10-CM | POA: Insufficient documentation

## 2022-08-10 LAB — CBC WITH DIFFERENTIAL/PLATELET
Abs Immature Granulocytes: 0.03 10*3/uL (ref 0.00–0.07)
Basophils Absolute: 0.1 10*3/uL (ref 0.0–0.1)
Basophils Relative: 1 %
Eosinophils Absolute: 0.1 10*3/uL (ref 0.0–0.5)
Eosinophils Relative: 1 %
HCT: 36 % (ref 36.0–46.0)
Hemoglobin: 11.7 g/dL — ABNORMAL LOW (ref 12.0–15.0)
Immature Granulocytes: 1 %
Lymphocytes Relative: 14 %
Lymphs Abs: 0.7 10*3/uL (ref 0.7–4.0)
MCH: 31.5 pg (ref 26.0–34.0)
MCHC: 32.5 g/dL (ref 30.0–36.0)
MCV: 97 fL (ref 80.0–100.0)
Monocytes Absolute: 0.3 10*3/uL (ref 0.1–1.0)
Monocytes Relative: 6 %
Neutro Abs: 4 10*3/uL (ref 1.7–7.7)
Neutrophils Relative %: 77 %
Platelets: 296 10*3/uL (ref 150–400)
RBC: 3.71 MIL/uL — ABNORMAL LOW (ref 3.87–5.11)
RDW: 15.4 % (ref 11.5–15.5)
WBC: 5.1 10*3/uL (ref 4.0–10.5)
nRBC: 0 % (ref 0.0–0.2)

## 2022-08-10 LAB — I-STAT CHEM 8, ED
BUN: 105 mg/dL — ABNORMAL HIGH (ref 6–20)
Calcium, Ion: 0.8 mmol/L — CL (ref 1.15–1.40)
Chloride: 107 mmol/L (ref 98–111)
Creatinine, Ser: 16.3 mg/dL — ABNORMAL HIGH (ref 0.44–1.00)
Glucose, Bld: 129 mg/dL — ABNORMAL HIGH (ref 70–99)
HCT: 37 % (ref 36.0–46.0)
Hemoglobin: 12.6 g/dL (ref 12.0–15.0)
Potassium: 4.5 mmol/L (ref 3.5–5.1)
Sodium: 137 mmol/L (ref 135–145)
TCO2: 16 mmol/L — ABNORMAL LOW (ref 22–32)

## 2022-08-10 LAB — COMPREHENSIVE METABOLIC PANEL
ALT: 23 U/L (ref 0–44)
AST: 20 U/L (ref 15–41)
Albumin: 2.9 g/dL — ABNORMAL LOW (ref 3.5–5.0)
Alkaline Phosphatase: 74 U/L (ref 38–126)
Anion gap: 24 — ABNORMAL HIGH (ref 5–15)
BUN: 112 mg/dL — ABNORMAL HIGH (ref 6–20)
CO2: 14 mmol/L — ABNORMAL LOW (ref 22–32)
Calcium: 7.3 mg/dL — ABNORMAL LOW (ref 8.9–10.3)
Chloride: 103 mmol/L (ref 98–111)
Creatinine, Ser: 14.26 mg/dL — ABNORMAL HIGH (ref 0.44–1.00)
GFR, Estimated: 3 mL/min — ABNORMAL LOW (ref >60)
Glucose, Bld: 133 mg/dL — ABNORMAL HIGH (ref 70–99)
Potassium: 4.6 mmol/L (ref 3.5–5.1)
Sodium: 141 mmol/L (ref 135–145)
Total Bilirubin: 0.9 mg/dL (ref 0.3–1.2)
Total Protein: 6.1 g/dL — ABNORMAL LOW (ref 6.5–8.1)

## 2022-08-10 LAB — CBG MONITORING, ED
Glucose-Capillary: 134 mg/dL — ABNORMAL HIGH (ref 70–99)
Glucose-Capillary: 139 mg/dL — ABNORMAL HIGH (ref 70–99)

## 2022-08-10 LAB — I-STAT BETA HCG BLOOD, ED (MC, WL, AP ONLY): I-stat hCG, quantitative: <5 m[IU]/mL (ref <5)

## 2022-08-10 MED ORDER — LORAZEPAM 2 MG/ML IJ SOLN
1.0000 mg | Freq: Once | INTRAMUSCULAR | Status: AC
  Administered 2022-08-11: 1 mg via INTRAVENOUS
  Filled 2022-08-10: qty 1

## 2022-08-10 MED ORDER — FENTANYL CITRATE PF 50 MCG/ML IJ SOSY
50.0000 ug | PREFILLED_SYRINGE | Freq: Once | INTRAMUSCULAR | Status: AC
  Administered 2022-08-11: 50 ug via INTRAVENOUS
  Filled 2022-08-10: qty 1

## 2022-08-10 MED ORDER — ALUM & MAG HYDROXIDE-SIMETH 200-200-20 MG/5ML PO SUSP
30.0000 mL | Freq: Once | ORAL | Status: AC
  Administered 2022-08-11: 30 mL via ORAL
  Filled 2022-08-10: qty 30

## 2022-08-10 MED ORDER — LIDOCAINE VISCOUS HCL 2 % MT SOLN
15.0000 mL | Freq: Once | OROMUCOSAL | Status: AC
  Administered 2022-08-11: 15 mL via ORAL
  Filled 2022-08-10: qty 15

## 2022-08-10 NOTE — ED Provider Triage Note (Signed)
Emergency Medicine Provider Triage Evaluation Note  Katrese Shell , a 26 y.o. female  was evaluated in triage.  Pt complains of nausea and vomiting which began yesterday. TNTC episodes today. Has tried antiemetics at home without relief. Endorsing associated CP. No abdominal pain or fevers. Feels similar to when she was admitted with hyperK. Last did home dialysis last night.  Review of Systems  Positive: As above Negative: As above  Physical Exam  BP (!) 131/100 (BP Location: Left Arm)   Pulse (!) 106   Temp 99.1 F (37.3 C) (Oral)   Resp 16   SpO2 96%  Gen:   Awake, no distress. Ill appearing, pale. Resp:  Normal effort  MSK:   Moves extremities without difficulty  Other:  Wretching in triage. Abdomen nondistended.  Medical Decision Making  Medically screening exam initiated at 7:34 PM.  Appropriate orders placed.  Amariya Liskey was informed that the remainder of the evaluation will be completed by another provider, this initial triage assessment does not replace that evaluation, and the importance of remaining in the ED until their evaluation is complete.  N/V in ESRD patient. Work up initiated.   Antonietta Breach, PA-C 08/10/22 1936

## 2022-08-10 NOTE — ED Triage Notes (Signed)
Patient arrived with EMS from home reports central chest pain with SOB and emesis this onset morning , peritoneal dialysis patient , denies fever or cough .

## 2022-08-11 ENCOUNTER — Emergency Department (HOSPITAL_BASED_OUTPATIENT_CLINIC_OR_DEPARTMENT_OTHER): Payer: Medicaid Other

## 2022-08-11 ENCOUNTER — Emergency Department (HOSPITAL_BASED_OUTPATIENT_CLINIC_OR_DEPARTMENT_OTHER)
Admission: EM | Admit: 2022-08-11 | Discharge: 2022-08-11 | Disposition: A | Discharge status: Home / Self Care | Payer: Medicaid Other | Source: Home / Self Care | Attending: Emergency Medicine | Admitting: Emergency Medicine

## 2022-08-11 ENCOUNTER — Other Ambulatory Visit: Payer: Self-pay

## 2022-08-11 ENCOUNTER — Encounter (HOSPITAL_BASED_OUTPATIENT_CLINIC_OR_DEPARTMENT_OTHER): Payer: Self-pay | Admitting: Emergency Medicine

## 2022-08-11 ENCOUNTER — Emergency Department (HOSPITAL_COMMUNITY): Payer: Medicaid Other

## 2022-08-11 DIAGNOSIS — R1013 Epigastric pain: Secondary | ICD-10-CM | POA: Insufficient documentation

## 2022-08-11 DIAGNOSIS — Z794 Long term (current) use of insulin: Secondary | ICD-10-CM | POA: Insufficient documentation

## 2022-08-11 DIAGNOSIS — I509 Heart failure, unspecified: Secondary | ICD-10-CM | POA: Insufficient documentation

## 2022-08-11 DIAGNOSIS — E875 Hyperkalemia: Secondary | ICD-10-CM | POA: Insufficient documentation

## 2022-08-11 DIAGNOSIS — D649 Anemia, unspecified: Secondary | ICD-10-CM | POA: Insufficient documentation

## 2022-08-11 DIAGNOSIS — R000 Tachycardia, unspecified: Secondary | ICD-10-CM | POA: Insufficient documentation

## 2022-08-11 DIAGNOSIS — E1022 Type 1 diabetes mellitus with diabetic chronic kidney disease: Secondary | ICD-10-CM | POA: Insufficient documentation

## 2022-08-11 DIAGNOSIS — E1065 Type 1 diabetes mellitus with hyperglycemia: Secondary | ICD-10-CM | POA: Insufficient documentation

## 2022-08-11 DIAGNOSIS — Z79899 Other long term (current) drug therapy: Secondary | ICD-10-CM | POA: Insufficient documentation

## 2022-08-11 DIAGNOSIS — I132 Hypertensive heart and chronic kidney disease with heart failure and with stage 5 chronic kidney disease, or end stage renal disease: Secondary | ICD-10-CM | POA: Insufficient documentation

## 2022-08-11 DIAGNOSIS — Z992 Dependence on renal dialysis: Secondary | ICD-10-CM | POA: Insufficient documentation

## 2022-08-11 DIAGNOSIS — Z1152 Encounter for screening for COVID-19: Secondary | ICD-10-CM | POA: Insufficient documentation

## 2022-08-11 DIAGNOSIS — R0789 Other chest pain: Secondary | ICD-10-CM | POA: Insufficient documentation

## 2022-08-11 DIAGNOSIS — N186 End stage renal disease: Secondary | ICD-10-CM | POA: Insufficient documentation

## 2022-08-11 LAB — RESP PANEL BY RT-PCR (RSV, FLU A&B, COVID)  RVPGX2
Influenza A by PCR: NEGATIVE
Influenza B by PCR: NEGATIVE
Resp Syncytial Virus by PCR: NEGATIVE
SARS Coronavirus 2 by RT PCR: NEGATIVE

## 2022-08-11 LAB — CBC WITH DIFFERENTIAL/PLATELET
Abs Immature Granulocytes: 0.01 10*3/uL (ref 0.00–0.07)
Basophils Absolute: 0 10*3/uL (ref 0.0–0.1)
Basophils Relative: 1 %
Eosinophils Absolute: 0 10*3/uL (ref 0.0–0.5)
Eosinophils Relative: 0 %
HCT: 36.4 % (ref 36.0–46.0)
Hemoglobin: 11.9 g/dL — ABNORMAL LOW (ref 12.0–15.0)
Immature Granulocytes: 0 %
Lymphocytes Relative: 13 %
Lymphs Abs: 0.6 10*3/uL — ABNORMAL LOW (ref 0.7–4.0)
MCH: 31.2 pg (ref 26.0–34.0)
MCHC: 32.7 g/dL (ref 30.0–36.0)
MCV: 95.3 fL (ref 80.0–100.0)
Monocytes Absolute: 0.3 10*3/uL (ref 0.1–1.0)
Monocytes Relative: 6 %
Neutro Abs: 3.4 10*3/uL (ref 1.7–7.7)
Neutrophils Relative %: 80 %
Platelets: 282 10*3/uL (ref 150–400)
RBC: 3.82 MIL/uL — ABNORMAL LOW (ref 3.87–5.11)
RDW: 15.2 % (ref 11.5–15.5)
WBC: 4.3 10*3/uL (ref 4.0–10.5)
nRBC: 0 % (ref 0.0–0.2)

## 2022-08-11 LAB — LIPASE, BLOOD: Lipase: 25 U/L (ref 11–51)

## 2022-08-11 LAB — COMPREHENSIVE METABOLIC PANEL
ALT: 21 U/L (ref 0–44)
AST: 20 U/L (ref 15–41)
Albumin: 2.9 g/dL — ABNORMAL LOW (ref 3.5–5.0)
Alkaline Phosphatase: 80 U/L (ref 38–126)
Anion gap: 21 — ABNORMAL HIGH (ref 5–15)
BUN: 121 mg/dL — ABNORMAL HIGH (ref 6–20)
CO2: 15 mmol/L — ABNORMAL LOW (ref 22–32)
Calcium: 7.3 mg/dL — ABNORMAL LOW (ref 8.9–10.3)
Chloride: 100 mmol/L (ref 98–111)
Creatinine, Ser: 14.41 mg/dL — ABNORMAL HIGH (ref 0.44–1.00)
GFR, Estimated: 3 mL/min — ABNORMAL LOW (ref >60)
Glucose, Bld: 212 mg/dL — ABNORMAL HIGH (ref 70–99)
Potassium: 5.4 mmol/L — ABNORMAL HIGH (ref 3.5–5.1)
Sodium: 136 mmol/L (ref 135–145)
Total Bilirubin: 0.9 mg/dL (ref 0.3–1.2)
Total Protein: 6.3 g/dL — ABNORMAL LOW (ref 6.5–8.1)

## 2022-08-11 LAB — I-STAT VENOUS BLOOD GAS, ED
Acid-base deficit: 12 mmol/L — ABNORMAL HIGH (ref 0.0–2.0)
Bicarbonate: 13.3 mmol/L — ABNORMAL LOW (ref 20.0–28.0)
Calcium, Ion: 0.86 mmol/L — CL (ref 1.15–1.40)
HCT: 35 % — ABNORMAL LOW (ref 36.0–46.0)
Hemoglobin: 11.9 g/dL — ABNORMAL LOW (ref 12.0–15.0)
O2 Saturation: 96 %
Patient temperature: 97.9
Potassium: 5.5 mmol/L — ABNORMAL HIGH (ref 3.5–5.1)
Sodium: 133 mmol/L — ABNORMAL LOW (ref 135–145)
TCO2: 14 mmol/L — ABNORMAL LOW (ref 22–32)
pCO2, Ven: 27.4 mmHg — ABNORMAL LOW (ref 44–60)
pH, Ven: 7.291 (ref 7.25–7.43)
pO2, Ven: 92 mmHg — ABNORMAL HIGH (ref 32–45)

## 2022-08-11 LAB — TROPONIN I (HIGH SENSITIVITY)
Troponin I (High Sensitivity): 40 ng/L — ABNORMAL HIGH (ref <18)
Troponin I (High Sensitivity): 42 ng/L — ABNORMAL HIGH (ref <18)
Troponin I (High Sensitivity): 40 ng/L — ABNORMAL HIGH (ref <18)
Troponin I (High Sensitivity): 48 ng/L — ABNORMAL HIGH (ref <18)

## 2022-08-11 LAB — GLUCOSE, CAPILLARY: Glucose-Capillary: 165 mg/dL — ABNORMAL HIGH (ref 70–99)

## 2022-08-11 LAB — MAGNESIUM: Magnesium: 2.5 mg/dL — ABNORMAL HIGH (ref 1.7–2.4)

## 2022-08-11 MED ORDER — LORAZEPAM 2 MG/ML IJ SOLN
1.0000 mg | Freq: Once | INTRAMUSCULAR | Status: AC
  Administered 2022-08-11: 1 mg via INTRAVENOUS
  Filled 2022-08-11: qty 1

## 2022-08-11 MED ORDER — LACTATED RINGERS IV BOLUS
250.0000 mL | Freq: Once | INTRAVENOUS | Status: AC
  Administered 2022-08-11: 250 mL via INTRAVENOUS

## 2022-08-11 MED ORDER — LORAZEPAM 1 MG PO TABS: 0.5000 mg | ORAL_TABLET | Freq: Three times a day (TID) | ORAL | 0 refills | Status: DC | PRN

## 2022-08-11 MED ORDER — PANTOPRAZOLE SODIUM 40 MG PO TBEC: 40.0000 mg | DELAYED_RELEASE_TABLET | Freq: Two times a day (BID) | ORAL | 3 refills | Status: DC

## 2022-08-11 MED ORDER — HYDROMORPHONE HCL 1 MG/ML IJ SOLN
1.0000 mg | Freq: Once | INTRAMUSCULAR | Status: AC
  Administered 2022-08-11: 1 mg via INTRAVENOUS
  Filled 2022-08-11: qty 1

## 2022-08-11 MED ORDER — ALUM & MAG HYDROXIDE-SIMETH 400-400-40 MG/5ML PO SUSP: 15.0000 mL | Freq: Four times a day (QID) | ORAL | 0 refills | Status: DC | PRN

## 2022-08-11 NOTE — ED Notes (Signed)
Reviewed discharge instructions and recommendations with pt. Pt discharged home with mother. Pt states understanding

## 2022-08-11 NOTE — Discharge Instructions (Signed)
Workup today was overall reassuring.  He needed your peritoneal dialysis this evening.  Follow-up with your main doctor for reevaluation on Monday.  Return to the ED if you are unable to eat or drink without vomiting or you have new or concerning symptoms or severe pain.

## 2022-08-11 NOTE — ED Notes (Signed)
Will hold off on giving medication until EDP reevaluates pt.

## 2022-08-11 NOTE — ED Provider Notes (Signed)
[facility] EMERGENCY DEPARTMENT Provider Note   CSN: [id number] Arrival date & time: 08/10/22  1921     History  Chief Complaint  Patient presents with   Chest Pain    Kathy Lewis is a 26 y.o. female.  26 year old female with end-stage renal disease on peritoneal dialysis, hypertension and gastroparesis who presents ER today secondary to nausea and vomiting and epigastric pain.  Patient states has been going on for approximately 14 hours.  No fevers.  No diffuse abdominal pain.  No diarrhea or shortness of breath.  Lasted peritoneal dialysis last night.   Chest Pain      Home Medications Prior to Admission medications   Medication Sig Start Date End Date Taking? Authorizing Provider  alum & mag hydroxide-simeth (MAALOX PLUS) 400-400-40 MG/5ML suspension Take 15 mLs by mouth every 6 (six) hours as needed for indigestion. 08/11/22  Yes Sanjeev Main, Corene Cornea, MD  LORazepam (ATIVAN) 1 MG tablet Take 0.5 tablets (0.5 mg total) by mouth 3 (three) times daily as needed. 08/11/22  Yes Sullivan Blasing, Corene Cornea, MD  acetaminophen (TYLENOL) 500 MG tablet Take 1,000 mg by mouth every 6 (six) hours as needed for headache (pain).    [provider]  amitriptyline (ELAVIL) 10 MG tablet Take 1 tablet (10 mg total) by mouth at bedtime. 05/25/22   [name], MD  atorvastatin (LIPITOR) 40 MG tablet Take 40 mg by mouth daily.    [provider]  butalbital-acetaminophen-caffeine (FIORICET) 50-325-40 MG tablet Take 1 tablet by mouth daily as needed for headache.    [provider]  calcitRIOL (ROCALTROL) 0.25 MCG capsule Take 0.25 mcg by mouth every morning. 08/03/21   [provider]  calcium carbonate (TUMS - DOSED IN MG ELEMENTAL CALCIUM) 500 MG chewable tablet Take 2 tablets by mouth 3 (three) times daily with meals.    [provider]  carvedilol (COREG) 12.5 MG tablet Take 12.5 mg by mouth 2 (two) times daily with a meal.    [provider]  chlorpheniramine-HYDROcodone (TUSSIONEX) 10-8 MG/5ML Take 5 mLs by mouth every 12 (twelve) hours as needed for cough. 07/06/22   Deno Etienne, DO  clonazePAM (KLONOPIN) 0.5 MG tablet Take 1 tablet (0.5 mg total) by mouth 2 (two) times daily as needed for anxiety. 03/16/22   [name], MD  hydrALAZINE (APRESOLINE) 100 MG tablet Take 100 mg by mouth 3 (three) times daily. 08/05/21   [provider]  HYDROcodone bit-homatropine (HYCODAN) 5-1.5 MG/5ML syrup Take 5 mLs by mouth every 6 (six) hours as needed for cough. 07/06/22   Deno Etienne, DO  insulin lispro (HUMALOG) 100 UNIT/ML injection Inject 0.5 Units into the skin See admin instructions. Via Insulin Pump - 1/2 unit per hour 01/08/22   [provider]  isosorbide mononitrate (IMDUR) 30 MG 24 hr tablet Take 30 mg by mouth in the morning and at bedtime.    [provider]  magnesium oxide (MAG-OX) 400 MG tablet Take 400 mg by mouth 2 (two) times daily. 08/03/21   [provider]  methocarbamol (ROBAXIN) 500 MG tablet Take 500 mg by mouth every 8 (eight) hours as needed for muscle spasms.    [provider]  ondansetron (ZOFRAN) 4 MG tablet Take 1 tablet (4 mg total) by mouth every 6 (six) hours. Patient taking differently: Take 4 mg by mouth every 6 (six) hours as needed for nausea or vomiting. 04/11/22   [name], PA  pantoprazole (PROTONIX) 40 MG tablet  Take 1 tablet (40 mg total) by mouth 2 (two) times daily before a meal. 08/11/22   Kennon Encinas, Corene Cornea, MD  Vitamin D, Ergocalciferol, (DRISDOL) 1.25 MG (50000 UNIT) CAPS capsule Take 50,000 Units by mouth every Sunday.    [provider]  escitalopram (LEXAPRO) 10 MG tablet Take 20 mg by mouth daily.  05/10/20 10/06/20  [provider]  furosemide (LASIX) 40 MG tablet Take 1 tablet (40 mg total) by mouth daily. Patient not taking: No sig reported 10/28/19 02/17/21  [name], MD  lisinopril (ZESTRIL) 10 MG tablet  Take 10 mg by mouth daily.  02/23/20 10/06/20  [provider]  spironolactone (ALDACTONE) 25 MG tablet Take 25 mg by mouth daily.  04/20/20 10/06/20  [provider]      Allergies    Cantaloupe extract allergy skin test, Strawberry extract, Citrullus vulgaris, and Nsaids    Review of Systems   Review of Systems  Cardiovascular:  Positive for chest pain.    Physical Exam Updated Vital Signs BP (!) 131/110   Pulse (!) 109   Temp 97.8 F (36.6 C) (Oral)   Resp (!) 27   SpO2 96%  Physical Exam Vitals and nursing note reviewed.  Constitutional:      Appearance: She is well-developed.  HENT:     Head: Normocephalic and atraumatic.  Cardiovascular:     Rate and Rhythm: Regular rhythm. Tachycardia present.  Pulmonary:     Effort: No respiratory distress.     Breath sounds: No stridor.  Chest:     Chest wall: No mass.  Abdominal:     General: There is no distension.     Comments: PD catheter insertion site is clean dry intact without evidence of infection  Abdomen is nontender  Musculoskeletal:     Cervical back: Normal range of motion.  Skin:    General: Skin is warm and dry.  Neurological:     Mental Status: She is alert.     ED Results / Procedures / Treatments   Labs (all labs ordered are listed, but only abnormal results are displayed) Labs Reviewed  CBC WITH DIFFERENTIAL/PLATELET - Abnormal; Notable for the following components:      Result Value   RBC 3.71 (*)    Hemoglobin 11.7 (*)    All other components within normal limits  COMPREHENSIVE METABOLIC PANEL - Abnormal; Notable for the following components:   CO2 14 (*)    Glucose, Bld 133 (*)    BUN 112 (*)    Creatinine, Ser 14.26 (*)    Calcium 7.3 (*)    Total Protein 6.1 (*)    Albumin 2.9 (*)    GFR, Estimated 3 (*)    Anion gap 24 (*)    All other components within normal limits  I-STAT CHEM 8, ED - Abnormal; Notable for the following components:   BUN 105 (*)    Creatinine, Ser  16.30 (*)    Glucose, Bld 129 (*)    Calcium, Ion 0.80 (*)    TCO2 16 (*)    All other components within normal limits  CBG MONITORING, ED - Abnormal; Notable for the following components:   Glucose-Capillary 134 (*)    All other components within normal limits  CBG MONITORING, ED - Abnormal; Notable for the following components:   Glucose-Capillary 139 (*)    All other components within normal limits  TROPONIN I (HIGH SENSITIVITY) - Abnormal; Notable for the following components:   Troponin I (High  Sensitivity) 40 (*)    All other components within normal limits  TROPONIN I (HIGH SENSITIVITY) - Abnormal; Notable for the following components:   Troponin I (High Sensitivity) 42 (*)    All other components within normal limits  I-STAT BETA HCG BLOOD, ED ([facility], [facility], [facility] ONLY)    EKG EKG Interpretation  Date/Time:  Thursday August 10 2022 19:46:40 EST Ventricular Rate:  109 PR Interval:  136 QRS Duration: 82 QT Interval:  390 QTC Calculation: 525 R Axis:   -27 Text Interpretation: Sinus tachycardia Left atrial enlargement Nonspecific ST and T wave abnormality Prolonged QT Abnormal ECG When compared with ECG of 28-Jul-2022 15:10, PREVIOUS ECG IS PRESENT Confirmed by Merrily Pew [phone]) on 08/10/2022 11:51:48 PM  Radiology DG Chest 1 View  Result Date: 08/11/2022 CLINICAL DATA:  Chest pain and syncope. EXAM: CHEST  1 VIEW COMPARISON:  07/28/2022 FINDINGS: Shallow inspiration. Diffuse cardiac enlargement. No vascular congestion or edema. Suggestion of focal infiltration in the right lung base, possibly pneumonia. No pleural effusions. No pneumothorax. Mediastinal contours appear intact. IMPRESSION: 1. Cardiac enlargement. 2. Focal infiltration suggested in the right lung base, possibly pneumonia. Electronically Signed   By: Lucienne Capers M.D.   On: 08/11/2022 00:31    Procedures Procedures    Medications Ordered in ED Medications  LORazepam (ATIVAN) injection 1 mg (1 mg  Intravenous Given 08/11/22 0246)  fentaNYL (SUBLIMAZE) injection 50 mcg (50 mcg Intravenous Given 08/11/22 0246)  alum & mag hydroxide-simeth (MAALOX/MYLANTA) 200-200-20 MG/5ML suspension 30 mL (30 mLs Oral Given 08/11/22 0246)    And  lidocaine (XYLOCAINE) 2 % viscous mouth solution 15 mL (15 mLs Oral Given 08/11/22 0248)  lactated ringers bolus 250 mL (0 mLs Intravenous Stopped 08/11/22 0500)  LORazepam (ATIVAN) injection 1 mg (1 mg Intravenous Given 08/11/22 3300)    ED Course/ Medical Decision Making/ A&P                           Medical Decision Making Amount and/or Complexity of Data Reviewed Radiology: ordered.  Risk OTC drugs. Prescription drug management.  With location of pain along with vomiting suspect likely gastritis versus esophagitis and will treat as such.  Mainly symptomatic treatment this time.  Will add on a chest x-ray troponin secondary to her renal disease and epigastric pain although her EKG looks similar to previous. Patient's symptoms improved with medications here in the ER.  Ativan seems to help quite a bit with her nauseous but I think she will get with her prolonged QT anyway.  Prescription for the same at home.  Patient appears stable and is requesting discharge at this time.  She is calling for a ride.   Final Clinical Impression(s) / ED Diagnoses Final diagnoses:  Nonspecific chest pain  Acute gastritis without hemorrhage, unspecified gastritis type    Rx / DC Orders ED Discharge Orders          Ordered    pantoprazole (PROTONIX) 40 MG tablet  2 times daily before meals        08/11/22 0455    alum & mag hydroxide-simeth (MAALOX PLUS) 400-400-40 MG/5ML suspension  Every 6 hours PRN        08/11/22 0455    LORazepam (ATIVAN) 1 MG tablet  3 times daily PRN        08/11/22 0703              Ronnie Mallette, Corene Cornea, MD 08/11/22 0710

## 2022-08-11 NOTE — ED Notes (Signed)
This RN was called by XR stating she passed out. This RN came to XR, pt lethargic but able to follow commands. Pt had an episode of emesis as well. EDP notified.

## 2022-08-11 NOTE — ED Provider Notes (Signed)
[name] EMERGENCY DEPARTMENT Provider Note   CSN: [id number] Arrival date & time: 08/11/22  1414     History  Chief Complaint  Patient presents with   Chest Pain    Kathy Lewis is a 26 y.o. female.   Chest Pain    Patient with medical history of type 1 diabetes, end-stage renal disease secondary to uncontrolled type 1 diabetes, bipolar 2, CHF with most recent EF of 20%, prolonged QT syndrome, hypertension presents to the emergency department due to nausea and vomiting.  States this happened about 3 days ago, associated with epigastric abdominal pain.  Today she started having diffuse chest pain which comes and goes, unable to identify provoking or alleviating factors.  She does have an associated productive cough, was seen at [facility] yesterday and felt somewhat better after workup but feels terrible again today prompting today's ED visit.  Patient does peritoneal dialysis at home, last treatment Wednesday 2 days ago due to being at most, overnight.  Home Medications Prior to Admission medications   Medication Sig Start Date End Date Taking? Authorizing Provider  acetaminophen (TYLENOL) 500 MG tablet Take 1,000 mg by mouth every 6 (six) hours as needed for headache (pain).    [provider]  alum & mag hydroxide-simeth (MAALOX PLUS) 400-400-40 MG/5ML suspension Take 15 mLs by mouth every 6 (six) hours as needed for indigestion. 08/11/22   Mesner, Corene Cornea, MD  amitriptyline (ELAVIL) 10 MG tablet Take 1 tablet (10 mg total) by mouth at bedtime. 05/25/22   [name], MD  atorvastatin (LIPITOR) 40 MG tablet Take 40 mg by mouth daily.    [provider]  butalbital-acetaminophen-caffeine (FIORICET) 50-325-40 MG tablet Take 1 tablet by mouth daily as needed for headache.    [provider]  calcitRIOL (ROCALTROL) 0.25 MCG capsule Take 0.25 mcg by mouth every morning. 08/03/21   [provider]  calcium carbonate (TUMS - DOSED  IN MG ELEMENTAL CALCIUM) 500 MG chewable tablet Take 2 tablets by mouth 3 (three) times daily with meals.    [provider]  carvedilol (COREG) 12.5 MG tablet Take 12.5 mg by mouth 2 (two) times daily with a meal.    [provider]  chlorpheniramine-HYDROcodone (TUSSIONEX) 10-8 MG/5ML Take 5 mLs by mouth every 12 (twelve) hours as needed for cough. 07/06/22   Deno Etienne, DO  clonazePAM (KLONOPIN) 0.5 MG tablet Take 1 tablet (0.5 mg total) by mouth 2 (two) times daily as needed for anxiety. 03/16/22   [name], MD  hydrALAZINE (APRESOLINE) 100 MG tablet Take 100 mg by mouth 3 (three) times daily. 08/05/21   [provider]  HYDROcodone bit-homatropine (HYCODAN) 5-1.5 MG/5ML syrup Take 5 mLs by mouth every 6 (six) hours as needed for cough. 07/06/22   Deno Etienne, DO  insulin lispro (HUMALOG) 100 UNIT/ML injection Inject 0.5 Units into the skin See admin instructions. Via Insulin Pump - 1/2 unit per hour 01/08/22   [provider]  isosorbide mononitrate (IMDUR) 30 MG 24 hr tablet Take 30 mg by mouth in the morning and at bedtime.    [provider]  LORazepam (ATIVAN) 1 MG tablet Take 0.5 tablets (0.5 mg total) by mouth 3 (three) times daily as needed. 08/11/22   Mesner, Corene Cornea, MD  magnesium oxide (MAG-OX) 400 MG tablet Take 400 mg by mouth 2 (two) times daily. 08/03/21   [provider]  methocarbamol (ROBAXIN) 500 MG tablet Take 500 mg by mouth every 8 (eight)  hours as needed for muscle spasms.    [provider]  ondansetron (ZOFRAN) 4 MG tablet Take 1 tablet (4 mg total) by mouth every 6 (six) hours. Patient taking differently: Take 4 mg by mouth every 6 (six) hours as needed for nausea or vomiting. 04/11/22   [name], PA  pantoprazole (PROTONIX) 40 MG tablet Take 1 tablet (40 mg total) by mouth 2 (two) times daily before a meal. 08/11/22   Mesner, Corene Cornea, MD  Vitamin D, Ergocalciferol, (DRISDOL) 1.25 MG (50000 UNIT)  CAPS capsule Take 50,000 Units by mouth every Sunday.    [provider]  escitalopram (LEXAPRO) 10 MG tablet Take 20 mg by mouth daily.  05/10/20 10/06/20  [provider]  furosemide (LASIX) 40 MG tablet Take 1 tablet (40 mg total) by mouth daily. Patient not taking: No sig reported 10/28/19 02/17/21  [name], MD  lisinopril (ZESTRIL) 10 MG tablet Take 10 mg by mouth daily.  02/23/20 10/06/20  [provider]  spironolactone (ALDACTONE) 25 MG tablet Take 25 mg by mouth daily.  04/20/20 10/06/20  [provider]      Allergies    Cantaloupe extract allergy skin test, Strawberry extract, Citrullus vulgaris, and Nsaids    Review of Systems   Review of Systems  Cardiovascular:  Positive for chest pain.    Physical Exam Updated Vital Signs BP (!) 111/100   Pulse 89   Temp 97.7 F (36.5 C)   Resp 13   Ht 5' (1.524 m)   Wt 61.8 kg   SpO2 99%   BMI 26.61 kg/m  Physical Exam Vitals and nursing note reviewed. Exam conducted with a chaperone present.  Constitutional:      Appearance: Normal appearance. She is ill-appearing.  HENT:     Head: Normocephalic and atraumatic.  Eyes:     General: No scleral icterus.       Right eye: No discharge.        Left eye: No discharge.     Extraocular Movements: Extraocular movements intact.     Pupils: Pupils are equal, round, and reactive to light.  Cardiovascular:     Rate and Rhythm: Regular rhythm. Tachycardia present.     Pulses: Normal pulses.     Heart sounds: Normal heart sounds.     No friction rub. No gallop.  Pulmonary:     Effort: Pulmonary effort is normal. No respiratory distress.     Breath sounds: Normal breath sounds.  Abdominal:     General: Abdomen is flat. Bowel sounds are normal. There is no distension.     Palpations: Abdomen is soft.     Tenderness: There is abdominal tenderness.     Comments: Epigastric abdominal tenderness. PD catheter in situ.  Skin:    General: Skin is warm and  dry.     Coloration: Skin is not jaundiced.  Neurological:     Mental Status: She is alert. Mental status is at baseline.     Coordination: Coordination normal.     ED Results / Procedures / Treatments   Labs (all labs ordered are listed, but only abnormal results are displayed) Labs Reviewed  GLUCOSE, CAPILLARY - Abnormal; Notable for the following components:      Result Value   Glucose-Capillary 165 (*)    All other components within normal limits  CBC WITH DIFFERENTIAL/PLATELET - Abnormal; Notable for the following components:   RBC 3.82 (*)    Hemoglobin 11.9 (*)    Lymphs  Abs 0.6 (*)    All other components within normal limits  MAGNESIUM - Abnormal; Notable for the following components:   Magnesium 2.5 (*)    All other components within normal limits  COMPREHENSIVE METABOLIC PANEL - Abnormal; Notable for the following components:   Potassium 5.4 (*)    CO2 15 (*)    Glucose, Bld 212 (*)    BUN 121 (*)    Creatinine, Ser 14.41 (*)    Calcium 7.3 (*)    Total Protein 6.3 (*)    Albumin 2.9 (*)    GFR, Estimated 3 (*)    Anion gap 21 (*)    All other components within normal limits  I-STAT VENOUS BLOOD GAS, ED - Abnormal; Notable for the following components:   pCO2, Ven 27.4 (*)    pO2, Ven 92 (*)    Bicarbonate 13.3 (*)    TCO2 14 (*)    Acid-base deficit 12.0 (*)    Sodium 133 (*)    Potassium 5.5 (*)    Calcium, Ion 0.86 (*)    HCT 35.0 (*)    Hemoglobin 11.9 (*)    All other components within normal limits  TROPONIN I (HIGH SENSITIVITY) - Abnormal; Notable for the following components:   Troponin I (High Sensitivity) 40 (*)    All other components within normal limits  TROPONIN I (HIGH SENSITIVITY) - Abnormal; Notable for the following components:   Troponin I (High Sensitivity) 48 (*)    All other components within normal limits  RESP PANEL BY RT-PCR (RSV, FLU A&B, COVID)  RVPGX2  LIPASE, BLOOD  BETA-HYDROXYBUTYRIC ACID    EKG EKG  Interpretation  Date/Time:  Friday August 11 2022 14:35:04 EST Ventricular Rate:  102 PR Interval:  148 QRS Duration: 90 QT Interval:  430 QTC Calculation: 560 R Axis:   -60 Text Interpretation: Sinus tachycardia Left atrial enlargement Left anterior fascicular block Nonspecific ST and T wave abnormality Prolonged QT Abnormal ECG Confirmed by Lennice Sites ([id number]) on 08/11/2022 2:37:57 PM  Radiology CT CHEST ABDOMEN PELVIS WO CONTRAST  Result Date: 08/11/2022 CLINICAL DATA:  26 year old with acute abdominal pain. EXAM: CT CHEST, ABDOMEN AND PELVIS WITHOUT CONTRAST TECHNIQUE: Multidetector CT imaging of the chest, abdomen and pelvis was performed following the standard protocol without IV contrast. RADIATION DOSE REDUCTION: This exam was performed according to the departmental dose-optimization program which includes automated exposure control, adjustment of the mA and/or kV according to patient size and/or use of iterative reconstruction technique. COMPARISON:  CT abdomen pelvis 06/30/2022 and chest CT 11/22/2021 FINDINGS: CT CHEST FINDINGS Cardiovascular: Cardiomegaly with small to moderate amount of pericardial fluid. Mediastinum/Nodes: Visualized thyroid tissue is unremarkable. No significant lymph node enlargement in the mediastinum or hila but limited evaluation without intravascular contrast. No axillary lymph node enlargement. Lungs/Pleura: No pleural effusions. Trachea and mainstem bronchi are patent. Volume loss in the left lower lobe which is chronic. Subtle ground-glass densities in the lungs may be related to areas atelectasis and/or air trapping. No significant airspace disease or lung consolidation in the lungs. Musculoskeletal: No acute bone abnormality. CT ABDOMEN PELVIS FINDINGS Hepatobiliary: Liver is prominent for size and stable. Normal appearance of the gallbladder. Pancreas: Unremarkable. No pancreatic ductal dilatation or surrounding inflammatory changes. Spleen: Normal in size  without focal abnormality. Adrenals/Urinary Tract: Normal adrenal glands. Negative for kidney stones. Normal appearance of both kidneys without hydronephrosis. Normal appearance of the urinary bladder. Stomach/Bowel: Normal stomach. No bowel dilatation. No focal bowel inflammation. Moderate amount of  stool throughout the colon. There appears to be a normal appendix containing gas. Vascular/Lymphatic: Normal caliber of the abdominal aorta. Atherosclerotic calcifications in the proximal femoral arteries. No lymph node enlargement in the abdomen or pelvis. Reproductive: Uterus and bilateral adnexa are unremarkable. Other: Small amount of ascites in the abdomen and pelvis likely associated with the peritoneal dialysis catheter which is coiled in the left anterior lower abdomen. Mild subcutaneous edema. Mild mesenteric edema. Musculoskeletal: No acute bone abnormality. IMPRESSION: 1. Cardiomegaly with a small to moderate amount of pericardial fluid. 2. Small amount of ascites in the abdomen and pelvis likely associated with the peritoneal dialysis catheter. 3. Subcutaneous and mesenteric edema could be related to fluid overload state. 4. No acute abnormality in the abdomen or pelvis. Electronically Signed   By: Markus Daft M.D.   On: 08/11/2022 16:25   DG Chest Portable 1 View  Result Date: 08/11/2022 CLINICAL DATA:  Chest pain and shortness of breath. EXAM: PORTABLE CHEST 1 VIEW COMPARISON:  Chest radiographs earlier today FINDINGS: The cardiac silhouette remains prominently enlarged, and there is mild prominence of the central pulmonary arteries. No airspace consolidation, overt pulmonary edema, large pleural effusion, or pneumothorax is identified. No acute osseous abnormality is seen. IMPRESSION: Cardiomegaly. No evidence of acute airspace disease. Electronically Signed   By: Logan Bores M.D.   On: 08/11/2022 15:25   DG Chest 1 View  Result Date: 08/11/2022 CLINICAL DATA:  Chest pain and syncope. EXAM:  CHEST  1 VIEW COMPARISON:  07/28/2022 FINDINGS: Shallow inspiration. Diffuse cardiac enlargement. No vascular congestion or edema. Suggestion of focal infiltration in the right lung base, possibly pneumonia. No pleural effusions. No pneumothorax. Mediastinal contours appear intact. IMPRESSION: 1. Cardiac enlargement. 2. Focal infiltration suggested in the right lung base, possibly pneumonia. Electronically Signed   By: Lucienne Capers M.D.   On: 08/11/2022 00:31    Procedures Procedures    Medications Ordered in ED Medications  LORazepam (ATIVAN) injection 1 mg (1 mg Intravenous Given 08/11/22 1522)  lactated ringers bolus 250 mL (0 mLs Intravenous Stopped 08/11/22 1643)  HYDROmorphone (DILAUDID) injection 1 mg (1 mg Intravenous Given 08/11/22 1602)    ED Course/ Medical Decision Making/ A&P Clinical Course as of 08/11/22 2244  Fri Aug 11, 2022  1501 EF 20% as of 03/23.  [HS]  1517 IMPRESSION: 1. Cardiomegaly with a small to moderate amount of pericardial fluid. 2. Small amount of ascites in the abdomen and pelvis likely associated with the peritoneal dialysis catheter. 3. Subcutaneous and mesenteric edema could be related to fluid overload state. 4. No acute abnormality in the abdomen or pelvis.   [HS]    Clinical Course User Index [HS] Sherrill Raring, PA-C                           Medical Decision Making Amount and/or Complexity of Data Reviewed Labs: ordered. Radiology: ordered.  Risk Prescription drug management.   Patient presents due to nausea, vomiting, epigastric abdominal pain and chest pain.  Differential includes but not limited to DKA, electrolyte abnormality, pancreatitis, HHS, pneumonia, COVID, renal injury, cyclic vomiting syndrome, ACS, PE.   Patient's mother is at bedside providing independent history.  Also viewed external medical records including last echo in March 2023 which showed EF 20%.  Patient was also admitted 07/28/2022 for intractable nausea and  vomiting thought to be attributed to cyclic vomiting.  Seen in ED yesterday and I reviewed those labs as well.  Patient was not pregnant yesterday.  Given history of QT prolonging and we will abstain from Zofran or QT prolonging agents.  Patient on cardiac monitoring in sinus tachycardia per my interpretation.    I ordered, viewed and interpreted laboratory workup. CBG with a glucose of 165. CBC without leukocytosis, stable anemia with hemoglobin 11.9 not grossly changed from yesterday. CMP shows hyperkalemia with a potassium of 5.4.  Glucose is mildly hyperglycemic at 212, creatinine is 14.41 which is high but consistent with not being able to do dialysis last 2 days.  She does have hypocalcemia with a reading of 7.3.  She has anion gap of 21, this is not grossly changed from yesterday's laboratory workup and I think it is due to not having dialysis. VBG does not show any acidosis, I do not think she is in DKA. Magnesium slightly low at 2.5. Lipase within normal limits, not consistent pancreatitis. Troponins are flat  I ordered and reviewed imaging studies.  I agree with radiologist interpretation. -Chest x-ray shows cardiomegaly, no acute process. -CT chest abdomen pelvis without contrast shows no acute process.  There is a comment of possible fluid overload state the patient does not appear fluid overloaded on exam, she is not having any rales, no lower extremity edema.  Considered PE but she is not hypoxic, pain is not pleuritic.  I think less likely.  Flat delta troponin, no ischemic change on EKG do not think this is ACS.  I ordered very small amount of fluid bolus 250 LR.  Ordered 1 mg Ativan 1 mg Dilaudid.  On evaluation patient found improved.  I reevaluated the patient who is tolerating p.o. heart rate has improved, she is in sinus rhythm on the monitor.  Not hypoxic, repeat abdominal exam is benign.  Pain is improved.    She is able to do dialysis later tonight.    Considered  admission but given stable vitals, improvement of symptoms and negative imaging and stable labs I do not think any additional  We have stressed the importance of this, given she is tolerating p.o. and pain is improved I do think she is appropriate for close outpatient follow-up and strict return precautions.  Case discussed with attending who is agreeable with this plan.        Final Clinical Impression(s) / ED Diagnoses Final diagnoses:  Atypical chest pain    Rx / DC Orders ED Discharge Orders     None         Sherrill Raring, Hershal Coria 08/11/22 2245    Blanchie Dessert, MD 08/11/22 [phone]

## 2022-08-11 NOTE — ED Notes (Signed)
Tolerated oral intake well, states she wants to go home

## 2022-08-11 NOTE — ED Triage Notes (Signed)
Pt POV in wheelchair-  Pt c/o chest pain, ShOB, emesis, dizziness with syncope yesterday. Poor PO intake.  Recently seen for same.   Pt presents with some of old ECG electrodes and gauze bandage still in place from prior visit, speaking in soft, drawn out speech. Answers questions appropriately.  Last dialysis tx- Wed night.

## 2022-08-12 ENCOUNTER — Other Ambulatory Visit: Payer: Self-pay

## 2022-08-12 ENCOUNTER — Emergency Department (HOSPITAL_BASED_OUTPATIENT_CLINIC_OR_DEPARTMENT_OTHER)
Admission: EM | Admit: 2022-08-12 | Discharge: 2022-08-12 | Disposition: A | Discharge status: Home / Self Care | Payer: Medicaid Other | Attending: Emergency Medicine | Admitting: Emergency Medicine

## 2022-08-12 ENCOUNTER — Emergency Department (HOSPITAL_BASED_OUTPATIENT_CLINIC_OR_DEPARTMENT_OTHER): Payer: Medicaid Other

## 2022-08-12 ENCOUNTER — Encounter (HOSPITAL_BASED_OUTPATIENT_CLINIC_OR_DEPARTMENT_OTHER): Payer: Self-pay | Admitting: Emergency Medicine

## 2022-08-12 DIAGNOSIS — I132 Hypertensive heart and chronic kidney disease with heart failure and with stage 5 chronic kidney disease, or end stage renal disease: Secondary | ICD-10-CM | POA: Diagnosis not present

## 2022-08-12 DIAGNOSIS — Z992 Dependence on renal dialysis: Secondary | ICD-10-CM | POA: Diagnosis not present

## 2022-08-12 DIAGNOSIS — Z794 Long term (current) use of insulin: Secondary | ICD-10-CM | POA: Insufficient documentation

## 2022-08-12 DIAGNOSIS — R072 Precordial pain: Secondary | ICD-10-CM | POA: Diagnosis not present

## 2022-08-12 DIAGNOSIS — E1022 Type 1 diabetes mellitus with diabetic chronic kidney disease: Secondary | ICD-10-CM | POA: Insufficient documentation

## 2022-08-12 DIAGNOSIS — R778 Other specified abnormalities of plasma proteins: Secondary | ICD-10-CM | POA: Diagnosis not present

## 2022-08-12 DIAGNOSIS — Z79899 Other long term (current) drug therapy: Secondary | ICD-10-CM | POA: Insufficient documentation

## 2022-08-12 DIAGNOSIS — R000 Tachycardia, unspecified: Secondary | ICD-10-CM | POA: Insufficient documentation

## 2022-08-12 DIAGNOSIS — R112 Nausea with vomiting, unspecified: Secondary | ICD-10-CM | POA: Diagnosis not present

## 2022-08-12 DIAGNOSIS — R0789 Other chest pain: Secondary | ICD-10-CM | POA: Diagnosis present

## 2022-08-12 DIAGNOSIS — N186 End stage renal disease: Secondary | ICD-10-CM | POA: Insufficient documentation

## 2022-08-12 DIAGNOSIS — I509 Heart failure, unspecified: Secondary | ICD-10-CM | POA: Diagnosis not present

## 2022-08-12 DIAGNOSIS — R059 Cough, unspecified: Secondary | ICD-10-CM | POA: Insufficient documentation

## 2022-08-12 DIAGNOSIS — E1065 Type 1 diabetes mellitus with hyperglycemia: Secondary | ICD-10-CM | POA: Insufficient documentation

## 2022-08-12 DIAGNOSIS — F419 Anxiety disorder, unspecified: Secondary | ICD-10-CM | POA: Insufficient documentation

## 2022-08-12 DIAGNOSIS — R079 Chest pain, unspecified: Secondary | ICD-10-CM

## 2022-08-12 LAB — HEPATIC FUNCTION PANEL
ALT: 19 U/L (ref 0–44)
AST: 16 U/L (ref 15–41)
Albumin: 3 g/dL — ABNORMAL LOW (ref 3.5–5.0)
Alkaline Phosphatase: 83 U/L (ref 38–126)
Bilirubin, Direct: 0.1 mg/dL (ref 0.0–0.2)
Indirect Bilirubin: 0.5 mg/dL (ref 0.3–0.9)
Total Bilirubin: 0.6 mg/dL (ref 0.3–1.2)
Total Protein: 6.4 g/dL — ABNORMAL LOW (ref 6.5–8.1)

## 2022-08-12 LAB — BASIC METABOLIC PANEL
Anion gap: 18 — ABNORMAL HIGH (ref 5–15)
BUN: 117 mg/dL — ABNORMAL HIGH (ref 6–20)
CO2: 19 mmol/L — ABNORMAL LOW (ref 22–32)
Calcium: 7.3 mg/dL — ABNORMAL LOW (ref 8.9–10.3)
Chloride: 101 mmol/L (ref 98–111)
Creatinine, Ser: 14.65 mg/dL — ABNORMAL HIGH (ref 0.44–1.00)
GFR, Estimated: 3 mL/min — ABNORMAL LOW (ref >60)
Glucose, Bld: 116 mg/dL — ABNORMAL HIGH (ref 70–99)
Potassium: 4.4 mmol/L (ref 3.5–5.1)
Sodium: 138 mmol/L (ref 135–145)

## 2022-08-12 LAB — CBC
HCT: 36.8 % (ref 36.0–46.0)
Hemoglobin: 12.1 g/dL (ref 12.0–15.0)
MCH: 30.9 pg (ref 26.0–34.0)
MCHC: 32.9 g/dL (ref 30.0–36.0)
MCV: 94.1 fL (ref 80.0–100.0)
Platelets: 299 10*3/uL (ref 150–400)
RBC: 3.91 MIL/uL (ref 3.87–5.11)
RDW: 15.2 % (ref 11.5–15.5)
WBC: 3.7 10*3/uL — ABNORMAL LOW (ref 4.0–10.5)
nRBC: 0 % (ref 0.0–0.2)

## 2022-08-12 LAB — LIPASE, BLOOD: Lipase: 25 U/L (ref 11–51)

## 2022-08-12 LAB — TROPONIN I (HIGH SENSITIVITY)
Troponin I (High Sensitivity): 43 ng/L — ABNORMAL HIGH (ref <18)
Troponin I (High Sensitivity): 44 ng/L — ABNORMAL HIGH (ref <18)

## 2022-08-12 LAB — CBG MONITORING, ED: Glucose-Capillary: 115 mg/dL — ABNORMAL HIGH (ref 70–99)

## 2022-08-12 MED ORDER — LORAZEPAM 2 MG/ML IJ SOLN
1.0000 mg | Freq: Once | INTRAMUSCULAR | Status: AC
  Administered 2022-08-12: 1 mg via INTRAVENOUS
  Filled 2022-08-12: qty 1

## 2022-08-12 NOTE — Discharge Instructions (Addendum)
Evaluation for your chest pain was overall reassuring.  EKG did not reveal concern for acute process related to your heart.  Recommend that you follow-up with your cardiologist.  We have provided contact information in your discharge summary.  If you have worsening shortness of breath, worsening chest pain, unexplained swelling or any other concerns please return to the emergency department for further evaluation.

## 2022-08-12 NOTE — ED Triage Notes (Signed)
Pt pov, to triage in wheelchair, c/o epigastric pain and midsternal CP, shob, dizziness, tx for same yesterday. States meds given in ED brought relief, symptoms returned today. Endorses tylenol pta with no relief

## 2022-08-12 NOTE — ED Notes (Signed)
D/c paperwork reviewed with pt and pts family at bedside. Pt verbalized understanding, no questions or concerns at time of d/c.

## 2022-08-12 NOTE — ED Notes (Signed)
Pts family member to hallway with reports that pt "fainted."  Pts CBG checked and WNL, VS rechecked, pt alert and oriented, follows commands at this time. C/o central non radiating chest pain 8/10.  EDP John made aware.

## 2022-08-12 NOTE — ED Provider Notes (Signed)
[facility] Provider Note   CSN: [id number] Arrival date & time: 08/12/22  1045     History  Chief Complaint  Patient presents with   Chest Pain   HPI Kathy Lewis is a 26 y.o. female with end-stage renal disease secondary to poorly controlled type 1 diabetes, bipolar 2, CHF with last EF of 20%, and hypertension presenting for chest pain.  Chest pain started 2 days ago.  It is left-sided and radiates to her epigastric region.  Feels sharp and worse with breathing.  At times it is reproducible.  Chest pain is intermittent.  Endorsing associated shortness of breath and cough.  Denies calf tenderness, OCP use and recent long trips.  Has taken Tylenol for symptoms.  Patient performs peritoneal dialysis daily at home and is up-to-date on her treatment.  Also mention that she has had significant nausea vomiting the last 24 hours but only dry heaving this morning.   Chest Pain Associated symptoms: nausea        Home Medications Prior to Admission medications   Medication Sig Start Date End Date Taking? Authorizing Provider  acetaminophen (TYLENOL) 500 MG tablet Take 1,000 mg by mouth every 6 (six) hours as needed for headache (pain).    [provider]  alum & mag hydroxide-simeth (MAALOX PLUS) 400-400-40 MG/5ML suspension Take 15 mLs by mouth every 6 (six) hours as needed for indigestion. 08/11/22   Mesner, Corene Cornea, MD  amitriptyline (ELAVIL) 10 MG tablet Take 1 tablet (10 mg total) by mouth at bedtime. 05/25/22   [name], MD  atorvastatin (LIPITOR) 40 MG tablet Take 40 mg by mouth daily.    [provider]  butalbital-acetaminophen-caffeine (FIORICET) 50-325-40 MG tablet Take 1 tablet by mouth daily as needed for headache.    [provider]  calcitRIOL (ROCALTROL) 0.25 MCG capsule Take 0.25 mcg by mouth every morning. 08/03/21   [provider]  calcium carbonate (TUMS - DOSED IN MG ELEMENTAL CALCIUM) 500 MG  chewable tablet Take 2 tablets by mouth 3 (three) times daily with meals.    [provider]  carvedilol (COREG) 12.5 MG tablet Take 12.5 mg by mouth 2 (two) times daily with a meal.    [provider]  chlorpheniramine-HYDROcodone (TUSSIONEX) 10-8 MG/5ML Take 5 mLs by mouth every 12 (twelve) hours as needed for cough. 07/06/22   Deno Etienne, DO  clonazePAM (KLONOPIN) 0.5 MG tablet Take 1 tablet (0.5 mg total) by mouth 2 (two) times daily as needed for anxiety. 03/16/22   [name], MD  hydrALAZINE (APRESOLINE) 100 MG tablet Take 100 mg by mouth 3 (three) times daily. 08/05/21   [provider]  HYDROcodone bit-homatropine (HYCODAN) 5-1.5 MG/5ML syrup Take 5 mLs by mouth every 6 (six) hours as needed for cough. 07/06/22   Deno Etienne, DO  insulin lispro (HUMALOG) 100 UNIT/ML injection Inject 0.5 Units into the skin See admin instructions. Via Insulin Pump - 1/2 unit per hour 01/08/22   [provider]  isosorbide mononitrate (IMDUR) 30 MG 24 hr tablet Take 30 mg by mouth in the morning and at bedtime.    [provider]  LORazepam (ATIVAN) 1 MG tablet Take 0.5 tablets (0.5 mg total) by mouth 3 (three) times daily as needed. 08/11/22   Mesner, Corene Cornea, MD  magnesium oxide (MAG-OX) 400 MG tablet Take 400 mg by mouth 2 (two) times daily. 08/03/21   [provider]  methocarbamol (ROBAXIN) 500 MG tablet Take 500 mg by  mouth every 8 (eight) hours as needed for muscle spasms.    [provider]  ondansetron (ZOFRAN) 4 MG tablet Take 1 tablet (4 mg total) by mouth every 6 (six) hours. Patient taking differently: Take 4 mg by mouth every 6 (six) hours as needed for nausea or vomiting. 04/11/22   [name], PA  pantoprazole (PROTONIX) 40 MG tablet Take 1 tablet (40 mg total) by mouth 2 (two) times daily before a meal. 08/11/22   Mesner, Corene Cornea, MD  Vitamin D, Ergocalciferol, (DRISDOL) 1.25 MG (50000 UNIT) CAPS capsule Take 50,000 Units by  mouth every Sunday.    [provider]  escitalopram (LEXAPRO) 10 MG tablet Take 20 mg by mouth daily.  05/10/20 10/06/20  [provider]  furosemide (LASIX) 40 MG tablet Take 1 tablet (40 mg total) by mouth daily. Patient not taking: No sig reported 10/28/19 02/17/21  [name], MD  lisinopril (ZESTRIL) 10 MG tablet Take 10 mg by mouth daily.  02/23/20 10/06/20  [provider]  spironolactone (ALDACTONE) 25 MG tablet Take 25 mg by mouth daily.  04/20/20 10/06/20  [provider]      Allergies    Cantaloupe extract allergy skin test, Strawberry extract, Citrullus vulgaris, and Nsaids    Review of Systems   Review of Systems  Cardiovascular:  Positive for chest pain.  Gastrointestinal:  Positive for nausea.    Physical Exam Updated Vital Signs BP (!) 164/123   Pulse (!) 111   Temp 98 F (36.7 C) (Oral)   Resp (!) 24   Wt 59 kg   SpO2 97%   BMI 25.39 kg/m  Physical Exam Vitals and nursing note reviewed.  Constitutional:      Appearance: She is ill-appearing. She is not toxic-appearing or diaphoretic.  HENT:     Head: Normocephalic and atraumatic.     Mouth/Throat:     Mouth: Mucous membranes are moist.  Eyes:     General:        Right eye: No discharge.        Left eye: No discharge.     Conjunctiva/sclera: Conjunctivae normal.  Cardiovascular:     Rate and Rhythm: Regular rhythm. Tachycardia present.     Pulses: Normal pulses.     Heart sounds: Normal heart sounds.  Pulmonary:     Effort: Pulmonary effort is normal.     Breath sounds: Normal breath sounds. No wheezing, rhonchi or rales.  Abdominal:     General: Abdomen is flat.     Palpations: Abdomen is soft.     Tenderness: There is no abdominal tenderness.  Musculoskeletal:     Right lower leg: No tenderness. No edema.     Left lower leg: No tenderness. No edema.  Skin:    General: Skin is warm and dry.  Neurological:     General: No focal deficit present.  Psychiatric:         Mood and Affect: Mood is anxious.     ED Results / Procedures / Treatments   Labs (all labs ordered are listed, but only abnormal results are displayed) Labs Reviewed  CBC - Abnormal; Notable for the following components:      Result Value   WBC 3.7 (*)    All other components within normal limits  BASIC METABOLIC PANEL - Abnormal; Notable for the following components:   CO2 19 (*)    Glucose, Bld 116 (*)    BUN 117 (*)    Creatinine,  Ser 14.65 (*)    Calcium 7.3 (*)    GFR, Estimated 3 (*)    Anion gap 18 (*)    All other components within normal limits  HEPATIC FUNCTION PANEL - Abnormal; Notable for the following components:   Total Protein 6.4 (*)    Albumin 3.0 (*)    All other components within normal limits  CBG MONITORING, ED - Abnormal; Notable for the following components:   Glucose-Capillary 115 (*)    All other components within normal limits  TROPONIN I (HIGH SENSITIVITY) - Abnormal; Notable for the following components:   Troponin I (High Sensitivity) 43 (*)    All other components within normal limits  TROPONIN I (HIGH SENSITIVITY) - Abnormal; Notable for the following components:   Troponin I (High Sensitivity) 44 (*)    All other components within normal limits  LIPASE, BLOOD  PREGNANCY, URINE  URINALYSIS, ROUTINE W REFLEX MICROSCOPIC    EKG EKG Interpretation  Date/Time:  Saturday August 12 2022 13:42:25 EST Ventricular Rate:  110 PR Interval:  138 QRS Duration: 103 QT Interval:  403 QTC Calculation: 546 R Axis:   22 Text Interpretation: Sinus tachycardia Biatrial enlargement Borderline low voltage, extremity leads Minimal ST depression, lateral leads Prolonged QT interval Confirmed by Nanda Quinton [phone]) on 08/12/2022 2:02:01 PM  Radiology DG Chest Portable 1 View  Result Date: 08/12/2022 CLINICAL DATA:  Pain. EXAM: PORTABLE CHEST 1 VIEW COMPARISON:  CT scan of the chest, abdomen, and pelvis and chest x-ray August 11, 2022 FINDINGS:  Stable enlargement of the cardiomediastinal silhouette. The hila, mediastinum, lungs, and pleura are otherwise unremarkable. IMPRESSION: Stable enlargement of the cardiomediastinal silhouette consistent with no cardiomegaly and pericardial effusion. No other abnormalities. Electronically Signed   By: Dorise Bullion III M.D.   On: 08/12/2022 11:47   CT CHEST ABDOMEN PELVIS WO CONTRAST  Result Date: 08/11/2022 CLINICAL DATA:  26 year old with acute abdominal pain. EXAM: CT CHEST, ABDOMEN AND PELVIS WITHOUT CONTRAST TECHNIQUE: Multidetector CT imaging of the chest, abdomen and pelvis was performed following the standard protocol without IV contrast. RADIATION DOSE REDUCTION: This exam was performed according to the departmental dose-optimization program which includes automated exposure control, adjustment of the mA and/or kV according to patient size and/or use of iterative reconstruction technique. COMPARISON:  CT abdomen pelvis 06/30/2022 and chest CT 11/22/2021 FINDINGS: CT CHEST FINDINGS Cardiovascular: Cardiomegaly with small to moderate amount of pericardial fluid. Mediastinum/Nodes: Visualized thyroid tissue is unremarkable. No significant lymph node enlargement in the mediastinum or hila but limited evaluation without intravascular contrast. No axillary lymph node enlargement. Lungs/Pleura: No pleural effusions. Trachea and mainstem bronchi are patent. Volume loss in the left lower lobe which is chronic. Subtle ground-glass densities in the lungs may be related to areas atelectasis and/or air trapping. No significant airspace disease or lung consolidation in the lungs. Musculoskeletal: No acute bone abnormality. CT ABDOMEN PELVIS FINDINGS Hepatobiliary: Liver is prominent for size and stable. Normal appearance of the gallbladder. Pancreas: Unremarkable. No pancreatic ductal dilatation or surrounding inflammatory changes. Spleen: Normal in size without focal abnormality. Adrenals/Urinary Tract: Normal  adrenal glands. Negative for kidney stones. Normal appearance of both kidneys without hydronephrosis. Normal appearance of the urinary bladder. Stomach/Bowel: Normal stomach. No bowel dilatation. No focal bowel inflammation. Moderate amount of stool throughout the colon. There appears to be a normal appendix containing gas. Vascular/Lymphatic: Normal caliber of the abdominal aorta. Atherosclerotic calcifications in the proximal femoral arteries. No lymph node enlargement in the abdomen or pelvis. Reproductive: Uterus  and bilateral adnexa are unremarkable. Other: Small amount of ascites in the abdomen and pelvis likely associated with the peritoneal dialysis catheter which is coiled in the left anterior lower abdomen. Mild subcutaneous edema. Mild mesenteric edema. Musculoskeletal: No acute bone abnormality. IMPRESSION: 1. Cardiomegaly with a small to moderate amount of pericardial fluid. 2. Small amount of ascites in the abdomen and pelvis likely associated with the peritoneal dialysis catheter. 3. Subcutaneous and mesenteric edema could be related to fluid overload state. 4. No acute abnormality in the abdomen or pelvis. Electronically Signed   By: Markus Daft M.D.   On: 08/11/2022 16:25   DG Chest Portable 1 View  Result Date: 08/11/2022 CLINICAL DATA:  Chest pain and shortness of breath. EXAM: PORTABLE CHEST 1 VIEW COMPARISON:  Chest radiographs earlier today FINDINGS: The cardiac silhouette remains prominently enlarged, and there is mild prominence of the central pulmonary arteries. No airspace consolidation, overt pulmonary edema, large pleural effusion, or pneumothorax is identified. No acute osseous abnormality is seen. IMPRESSION: Cardiomegaly. No evidence of acute airspace disease. Electronically Signed   By: Logan Bores M.D.   On: 08/11/2022 15:25   DG Chest 1 View  Result Date: 08/11/2022 CLINICAL DATA:  Chest pain and syncope. EXAM: CHEST  1 VIEW COMPARISON:  07/28/2022 FINDINGS: Shallow  inspiration. Diffuse cardiac enlargement. No vascular congestion or edema. Suggestion of focal infiltration in the right lung base, possibly pneumonia. No pleural effusions. No pneumothorax. Mediastinal contours appear intact. IMPRESSION: 1. Cardiac enlargement. 2. Focal infiltration suggested in the right lung base, possibly pneumonia. Electronically Signed   By: Lucienne Capers M.D.   On: 08/11/2022 00:31    Procedures Procedures    Medications Ordered in ED Medications  LORazepam (ATIVAN) injection 1 mg (has no administration in time range)  LORazepam (ATIVAN) injection 1 mg (1 mg Intravenous Given 08/12/22 1146)    ED Course/ Medical Decision Making/ A&P                           Medical Decision Making Amount and/or Complexity of Data Reviewed Labs: ordered. Radiology: ordered.   Initial Impression and Ddx 26 year old female who is ill-appearing, anxious, nontoxic but otherwise hemodynamically stable presenting for chest pain.  Physical exam notable for tachycardia.  Differential diagnosis for this complaint includes ACS, PE, aortic dissection, and acute pancreatitis, PUD or GERD. Patient PMH that increases complexity of ED encounter: ESRD, CHF, poorly controlled type 1 diabetes, dyslipidemia and hypertension  Interpretation of Diagnostics I independent reviewed and interpreted the labs as followed: Elevated troponins, hyperglycemia, elevated BUN and creatinine  - I independently visualized the following imaging with scope of interpretation limited to determining acute life threatening conditions related to emergency care: Chest x-ray, which revealed stable enlargement of the cardiomediastinal silhouette  I independently reviewed and interpreted EKG which revealed sinus tachycardia and prolonged QT.  Patient Reassessment and Ultimate Disposition/Management Treated nausea and anxiety likely associated with encounter with Ativan.  On reevaluation, patient mentioned that she felt  much better.  Upon chart review patient was evaluated for nausea vomiting yesterday.  Had CT scan of chest abdomen pelvis which did not reveal any acute process.  Chest x-ray also yesterday revealed stable cardiomegaly.  Considered PE but unlikely given patient not hypoxic, flat delta troponin, and patient on OCPs, denies calf tenderness and recent long trips.  Also considered ACS but unlikely given flat troponins and troponin level was elevated but near baseline.  This is likely  due to her chronic heart failure and renal failure.  Recommended that she follow-up with her cardiologist for reevaluation in a few days.  Upon reevaluation patient stated that she was feeling better but still nauseous.  Treated again with Ativan.  Also considered aortic dissection but unlikely given symmetric pulses.  Patient management required discussion with the following services or consulting groups:  None  Complexity of Problems Addressed Acute complicated illness or Injury  Additional Data Reviewed and Analyzed Further history obtained from: Past medical history and medications listed in the EMR, Prior ED visit notes, Recent discharge summary, Recent PCP notes, and Prior labs/imaging results  Patient Encounter Risk Assessment Consideration of hospitalization         Final Clinical Impression(s) / ED Diagnoses Final diagnoses:  Precordial pain  Chest pain, unspecified type    Rx / DC Orders ED Discharge Orders          Ordered    Ambulatory referral to Cardiology       Comments: If you have not heard from the Cardiology office within the next 72 hours please call [phone].   08/12/22 1404              Harriet Pho, PA-C 08/12/22 1456    Long, W[facility], MD 08/13/22 1039

## 2022-08-17 ENCOUNTER — Telehealth (HOSPITAL_COMMUNITY): Payer: Self-pay

## 2022-08-17 NOTE — Telephone Encounter (Signed)
Recv'ed same Medicaid form back, which is not filled out correctly. Called the doctor office and left a message with Dr. Rosanna Randy nurse to contact [facility]. Also will refax form.

## 2022-08-24 ENCOUNTER — Other Ambulatory Visit: Payer: Self-pay

## 2022-08-24 ENCOUNTER — Encounter (HOSPITAL_BASED_OUTPATIENT_CLINIC_OR_DEPARTMENT_OTHER): Payer: Self-pay

## 2022-08-24 ENCOUNTER — Emergency Department (HOSPITAL_BASED_OUTPATIENT_CLINIC_OR_DEPARTMENT_OTHER)
Admission: EM | Admit: 2022-08-24 | Discharge: 2022-08-24 | Disposition: A | Discharge status: Home / Self Care | Payer: Medicaid Other | Attending: Emergency Medicine | Admitting: Emergency Medicine

## 2022-08-24 ENCOUNTER — Emergency Department (HOSPITAL_BASED_OUTPATIENT_CLINIC_OR_DEPARTMENT_OTHER): Payer: Medicaid Other

## 2022-08-24 DIAGNOSIS — Z794 Long term (current) use of insulin: Secondary | ICD-10-CM | POA: Insufficient documentation

## 2022-08-24 DIAGNOSIS — R600 Localized edema: Secondary | ICD-10-CM | POA: Insufficient documentation

## 2022-08-24 DIAGNOSIS — E1165 Type 2 diabetes mellitus with hyperglycemia: Secondary | ICD-10-CM | POA: Diagnosis not present

## 2022-08-24 DIAGNOSIS — R739 Hyperglycemia, unspecified: Secondary | ICD-10-CM

## 2022-08-24 DIAGNOSIS — Z79899 Other long term (current) drug therapy: Secondary | ICD-10-CM | POA: Diagnosis not present

## 2022-08-24 DIAGNOSIS — I120 Hypertensive chronic kidney disease with stage 5 chronic kidney disease or end stage renal disease: Secondary | ICD-10-CM | POA: Insufficient documentation

## 2022-08-24 DIAGNOSIS — N186 End stage renal disease: Secondary | ICD-10-CM | POA: Insufficient documentation

## 2022-08-24 DIAGNOSIS — E871 Hypo-osmolality and hyponatremia: Secondary | ICD-10-CM | POA: Diagnosis not present

## 2022-08-24 DIAGNOSIS — R079 Chest pain, unspecified: Secondary | ICD-10-CM | POA: Insufficient documentation

## 2022-08-24 DIAGNOSIS — R112 Nausea with vomiting, unspecified: Secondary | ICD-10-CM | POA: Diagnosis present

## 2022-08-24 DIAGNOSIS — R109 Unspecified abdominal pain: Secondary | ICD-10-CM | POA: Insufficient documentation

## 2022-08-24 DIAGNOSIS — Z992 Dependence on renal dialysis: Secondary | ICD-10-CM | POA: Insufficient documentation

## 2022-08-24 DIAGNOSIS — R000 Tachycardia, unspecified: Secondary | ICD-10-CM | POA: Diagnosis not present

## 2022-08-24 LAB — I-STAT VENOUS BLOOD GAS, ED
Acid-base deficit: 2 mmol/L (ref 0.0–2.0)
Bicarbonate: 24.9 mmol/L (ref 20.0–28.0)
Calcium, Ion: 1.09 mmol/L — ABNORMAL LOW (ref 1.15–1.40)
HCT: 46 % (ref 36.0–46.0)
Hemoglobin: 15.6 g/dL — ABNORMAL HIGH (ref 12.0–15.0)
O2 Saturation: 67 %
Potassium: 5.2 mmol/L — ABNORMAL HIGH (ref 3.5–5.1)
Sodium: 124 mmol/L — ABNORMAL LOW (ref 135–145)
TCO2: 26 mmol/L (ref 22–32)
pCO2, Ven: 48.2 mmHg (ref 44–60)
pH, Ven: 7.32 (ref 7.25–7.43)
pO2, Ven: 38 mmHg (ref 32–45)

## 2022-08-24 LAB — CBC WITH DIFFERENTIAL/PLATELET
Abs Immature Granulocytes: 0.02 10*3/uL (ref 0.00–0.07)
Basophils Absolute: 0 10*3/uL (ref 0.0–0.1)
Basophils Relative: 1 %
Eosinophils Absolute: 0.1 10*3/uL (ref 0.0–0.5)
Eosinophils Relative: 1 %
HCT: 41.8 % (ref 36.0–46.0)
Hemoglobin: 14.1 g/dL (ref 12.0–15.0)
Immature Granulocytes: 0 %
Lymphocytes Relative: 11 %
Lymphs Abs: 0.7 10*3/uL (ref 0.7–4.0)
MCH: 30.9 pg (ref 26.0–34.0)
MCHC: 33.7 g/dL (ref 30.0–36.0)
MCV: 91.7 fL (ref 80.0–100.0)
Monocytes Absolute: 0.5 10*3/uL (ref 0.1–1.0)
Monocytes Relative: 7 %
Neutro Abs: 5.3 10*3/uL (ref 1.7–7.7)
Neutrophils Relative %: 80 %
Platelets: 228 10*3/uL (ref 150–400)
RBC: 4.56 MIL/uL (ref 3.87–5.11)
RDW: 12.9 % (ref 11.5–15.5)
WBC: 6.7 10*3/uL (ref 4.0–10.5)
nRBC: 0 % (ref 0.0–0.2)

## 2022-08-24 LAB — COMPREHENSIVE METABOLIC PANEL
ALT: 11 U/L (ref 0–44)
AST: 24 U/L (ref 15–41)
Albumin: 3.4 g/dL — ABNORMAL LOW (ref 3.5–5.0)
Alkaline Phosphatase: 106 U/L (ref 38–126)
Anion gap: 13 (ref 5–15)
BUN: 43 mg/dL — ABNORMAL HIGH (ref 6–20)
CO2: 21 mmol/L — ABNORMAL LOW (ref 22–32)
Calcium: 8.1 mg/dL — ABNORMAL LOW (ref 8.9–10.3)
Chloride: 90 mmol/L — ABNORMAL LOW (ref 98–111)
Creatinine, Ser: 8.32 mg/dL — ABNORMAL HIGH (ref 0.44–1.00)
GFR, Estimated: 6 mL/min — ABNORMAL LOW (ref >60)
Glucose, Bld: 456 mg/dL — ABNORMAL HIGH (ref 70–99)
Potassium: 5.3 mmol/L — ABNORMAL HIGH (ref 3.5–5.1)
Sodium: 124 mmol/L — ABNORMAL LOW (ref 135–145)
Total Bilirubin: 0.6 mg/dL (ref 0.3–1.2)
Total Protein: 7 g/dL (ref 6.5–8.1)

## 2022-08-24 LAB — BASIC METABOLIC PANEL
Anion gap: 12 (ref 5–15)
BUN: 42 mg/dL — ABNORMAL HIGH (ref 6–20)
CO2: 20 mmol/L — ABNORMAL LOW (ref 22–32)
Calcium: 7.2 mg/dL — ABNORMAL LOW (ref 8.9–10.3)
Chloride: 97 mmol/L — ABNORMAL LOW (ref 98–111)
Creatinine, Ser: 8.17 mg/dL — ABNORMAL HIGH (ref 0.44–1.00)
GFR, Estimated: 6 mL/min — ABNORMAL LOW (ref >60)
Glucose, Bld: 143 mg/dL — ABNORMAL HIGH (ref 70–99)
Potassium: 5 mmol/L (ref 3.5–5.1)
Sodium: 129 mmol/L — ABNORMAL LOW (ref 135–145)

## 2022-08-24 LAB — CBG MONITORING, ED
Glucose-Capillary: 464 mg/dL — ABNORMAL HIGH (ref 70–99)
Glucose-Capillary: 397 mg/dL — ABNORMAL HIGH (ref 70–99)
Glucose-Capillary: 238 mg/dL — ABNORMAL HIGH (ref 70–99)
Glucose-Capillary: 155 mg/dL — ABNORMAL HIGH (ref 70–99)
Glucose-Capillary: 147 mg/dL — ABNORMAL HIGH (ref 70–99)
Glucose-Capillary: 163 mg/dL — ABNORMAL HIGH (ref 70–99)

## 2022-08-24 LAB — TROPONIN I (HIGH SENSITIVITY): Troponin I (High Sensitivity): 73 ng/L — ABNORMAL HIGH (ref <18)

## 2022-08-24 MED ORDER — FENTANYL CITRATE PF 50 MCG/ML IJ SOSY
50.0000 ug | PREFILLED_SYRINGE | Freq: Once | INTRAMUSCULAR | Status: AC
  Administered 2022-08-24: 50 ug via INTRAVENOUS
  Filled 2022-08-24: qty 1

## 2022-08-24 MED ORDER — SODIUM CHLORIDE 0.9 % IV SOLN
500.0000 mg | Freq: Once | INTRAVENOUS | Status: AC
  Administered 2022-08-24: 500 mg via INTRAVENOUS
  Filled 2022-08-24: qty 5

## 2022-08-24 MED ORDER — DEXTROSE 50 % IV SOLN: 0.0000 mL | INTRAVENOUS | Status: DC | PRN

## 2022-08-24 MED ORDER — SODIUM CHLORIDE 0.9 % IV BOLUS
250.0000 mL | Freq: Once | INTRAVENOUS | Status: AC
  Administered 2022-08-24: 250 mL via INTRAVENOUS

## 2022-08-24 MED ORDER — SODIUM CHLORIDE 0.9 % IV SOLN
1.0000 g | Freq: Once | INTRAVENOUS | Status: AC
  Administered 2022-08-24: 1 g via INTRAVENOUS
  Filled 2022-08-24: qty 10

## 2022-08-24 MED ORDER — INSULIN REGULAR(HUMAN) IN NACL 100-0.9 UT/100ML-% IV SOLN
INTRAVENOUS | Status: DC
  Administered 2022-08-24: 5.5 [IU]/h via INTRAVENOUS
  Filled 2022-08-24: qty 100

## 2022-08-24 NOTE — ED Notes (Signed)
Pt left room before D/C papers given and signing discharge (esign)/ pt was given instructions prior to paperwork and did not have further questions.

## 2022-08-24 NOTE — ED Triage Notes (Signed)
Pt c/o hyperglycemia "all day, over 600." Associated NV, chest pain. States compliance w medication. Last dialysis last night Pt w witnessed syncopal episode on arrival to triage  [facility] [phone]

## 2022-08-24 NOTE — Discharge Instructions (Signed)
Restart your insulin pump when you get home.  Monitor your blood sugars carefully.  You need to follow-up with your nephrologist to have your blood work rechecked.  You also need to talk to them about rescheduling your permacath insertion.  Return to the emergency room if you have any worsening symptoms.

## 2022-08-24 NOTE — ED Provider Notes (Signed)
[place] EMERGENCY DEPARTMENT Provider Note   CSN: [id number] Arrival date & time: 08/24/22  1540     History  Chief Complaint  Patient presents with   Hyperglycemia        Chest Pain   Nausea   Emesis    Kathy Lewis is a 26 y.o. female.  Patient is a 26 year old female with a complex medical history including poorly controlled diabetes, hypertension, bipolar disorder, end-stage renal disease currently on peritoneal dialysis at home and advanced heart failure with a recent EF less than 15%.  She presents with elevated blood sugars and vomiting.  On chart review, she was recently admitted to [facility].  She was initially admitted to [facility] on 12/22.  She had a right heart cath which showed severe volume overload, pulmonary hypertension and a low EF of 13%.  She was transferred to [facility] and had a Swan-Ganz catheter placed.  She was initially on CRRT and then sounds like was transition to intermittent hemodialysis.  They had wanted to place a permacath and transition the patient to hemodialysis to have better control of her fluid status but she left AMA yesterday.  She states she was scheduled today to have a catheter placed but felt badly and her sugars were out of control so she came here.  She states in the hospital she was getting insulin injections.  She placed her insulin pump back on about noon today.  But she states her blood sugars have been in the 600 range and she started having some nausea and vomiting.  She also has some shortness of breath and some pain in her abdomen and up into her chest.  No fevers.  No cough or cold symptoms.       Home Medications Prior to Admission medications   Medication Sig Start Date End Date Taking? Authorizing Provider  acetaminophen (TYLENOL) 500 MG tablet Take 1,000 mg by mouth every 6 (six) hours as needed for headache (pain).    [provider]  alum & mag hydroxide-simeth (MAALOX  PLUS) 400-400-40 MG/5ML suspension Take 15 mLs by mouth every 6 (six) hours as needed for indigestion. 08/11/22   Mesner, Corene Cornea, MD  amitriptyline (ELAVIL) 10 MG tablet Take 1 tablet (10 mg total) by mouth at bedtime. 05/25/22   [name], MD  atorvastatin (LIPITOR) 40 MG tablet Take 40 mg by mouth daily.    [provider]  butalbital-acetaminophen-caffeine (FIORICET) 50-325-40 MG tablet Take 1 tablet by mouth daily as needed for headache.    [provider]  calcitRIOL (ROCALTROL) 0.25 MCG capsule Take 0.25 mcg by mouth every morning. 08/03/21   [provider]  calcium carbonate (TUMS - DOSED IN MG ELEMENTAL CALCIUM) 500 MG chewable tablet Take 2 tablets by mouth 3 (three) times daily with meals.    [provider]  carvedilol (COREG) 12.5 MG tablet Take 12.5 mg by mouth 2 (two) times daily with a meal.    [provider]  chlorpheniramine-HYDROcodone (TUSSIONEX) 10-8 MG/5ML Take 5 mLs by mouth every 12 (twelve) hours as needed for cough. 07/06/22   Deno Etienne, DO  clonazePAM (KLONOPIN) 0.5 MG tablet Take 1 tablet (0.5 mg total) by mouth 2 (two) times daily as needed for anxiety. 03/16/22   [name], MD  hydrALAZINE (APRESOLINE) 100 MG tablet Take 100 mg by mouth 3 (three) times daily. 08/05/21   [provider]  HYDROcodone bit-homatropine (HYCODAN) 5-1.5 MG/5ML syrup Take 5  mLs by mouth every 6 (six) hours as needed for cough. 07/06/22   Deno Etienne, DO  insulin lispro (HUMALOG) 100 UNIT/ML injection Inject 0.5 Units into the skin See admin instructions. Via Insulin Pump - 1/2 unit per hour 01/08/22   [provider]  isosorbide mononitrate (IMDUR) 30 MG 24 hr tablet Take 30 mg by mouth in the morning and at bedtime.    [provider]  LORazepam (ATIVAN) 1 MG tablet Take 0.5 tablets (0.5 mg total) by mouth 3 (three) times daily as needed. 08/11/22   Mesner, Corene Cornea, MD  magnesium oxide (MAG-OX) 400 MG tablet Take 400  mg by mouth 2 (two) times daily. 08/03/21   [provider]  methocarbamol (ROBAXIN) 500 MG tablet Take 500 mg by mouth every 8 (eight) hours as needed for muscle spasms.    [provider]  ondansetron (ZOFRAN) 4 MG tablet Take 1 tablet (4 mg total) by mouth every 6 (six) hours. Patient taking differently: Take 4 mg by mouth every 6 (six) hours as needed for nausea or vomiting. 04/11/22   [name], PA  pantoprazole (PROTONIX) 40 MG tablet Take 1 tablet (40 mg total) by mouth 2 (two) times daily before a meal. 08/11/22   Mesner, Corene Cornea, MD  Vitamin D, Ergocalciferol, (DRISDOL) 1.25 MG (50000 UNIT) CAPS capsule Take 50,000 Units by mouth every Sunday.    [provider]  escitalopram (LEXAPRO) 10 MG tablet Take 20 mg by mouth daily.  05/10/20 10/06/20  [provider]  furosemide (LASIX) 40 MG tablet Take 1 tablet (40 mg total) by mouth daily. Patient not taking: No sig reported 10/28/19 02/17/21  [name], MD  lisinopril (ZESTRIL) 10 MG tablet Take 10 mg by mouth daily.  02/23/20 10/06/20  [provider]  spironolactone (ALDACTONE) 25 MG tablet Take 25 mg by mouth daily.  04/20/20 10/06/20  [provider]      Allergies    Cantaloupe extract allergy skin test, Strawberry extract, Citrullus vulgaris, and Nsaids    Review of Systems   Review of Systems  Constitutional:  Positive for fatigue. Negative for chills, diaphoresis and fever.  HENT:  Negative for congestion, rhinorrhea and sneezing.   Eyes: Negative.   Respiratory:  Positive for shortness of breath. Negative for cough and chest tightness.   Cardiovascular:  Positive for chest pain. Negative for leg swelling.  Gastrointestinal:  Positive for abdominal pain, nausea and vomiting. Negative for blood in stool and diarrhea.  Genitourinary:  Negative for difficulty urinating, flank pain, frequency and hematuria.  Musculoskeletal:  Negative for arthralgias and back pain.  Skin:   Negative for rash.  Neurological:  Negative for dizziness, speech difficulty, weakness, numbness and headaches.    Physical Exam Updated Vital Signs BP 100/78   Pulse (!) 118   Temp 98.3 F (36.8 C) (Oral)   Resp (!) 21   SpO2 98%  Physical Exam Constitutional:      Appearance: She is well-developed. She is ill-appearing.  HENT:     Head: Normocephalic and atraumatic.  Eyes:     Pupils: Pupils are equal, round, and reactive to light.  Cardiovascular:     Rate and Rhythm: Normal rate and regular rhythm.     Heart sounds: Normal heart sounds.  Pulmonary:     Effort: Pulmonary effort is normal. No respiratory distress.     Breath sounds: Rales present. No wheezing.     Comments: Mild tachypnea Chest:     Chest wall:  No tenderness.  Abdominal:     General: Bowel sounds are normal.     Palpations: Abdomen is soft.     Tenderness: There is no abdominal tenderness. There is no guarding or rebound.  Musculoskeletal:        General: Normal range of motion.     Cervical back: Normal range of motion and neck supple.     Comments: 1-2+ pitting edema to lower extremities bilaterally  Lymphadenopathy:     Cervical: No cervical adenopathy.  Skin:    General: Skin is warm and dry.     Findings: No rash.  Neurological:     Mental Status: She is alert and oriented to person, place, and time.     ED Results / Procedures / Treatments   Labs (all labs ordered are listed, but only abnormal results are displayed) Labs Reviewed  COMPREHENSIVE METABOLIC PANEL - Abnormal; Notable for the following components:      Result Value   Sodium 124 (*)    Potassium 5.3 (*)    Chloride 90 (*)    CO2 21 (*)    Glucose, Bld 456 (*)    BUN 43 (*)    Creatinine, Ser 8.32 (*)    Calcium 8.1 (*)    Albumin 3.4 (*)    GFR, Estimated 6 (*)    All other components within normal limits  BASIC METABOLIC PANEL - Abnormal; Notable for the following components:   Sodium 129 (*)    Chloride 97 (*)     CO2 20 (*)    Glucose, Bld 143 (*)    BUN 42 (*)    Creatinine, Ser 8.17 (*)    Calcium 7.2 (*)    GFR, Estimated 6 (*)    All other components within normal limits  CBG MONITORING, ED - Abnormal; Notable for the following components:   Glucose-Capillary 464 (*)    All other components within normal limits  CBG MONITORING, ED - Abnormal; Notable for the following components:   Glucose-Capillary 397 (*)    All other components within normal limits  I-STAT VENOUS BLOOD GAS, ED - Abnormal; Notable for the following components:   Sodium 124 (*)    Potassium 5.2 (*)    Calcium, Ion 1.09 (*)    Hemoglobin 15.6 (*)    All other components within normal limits  CBG MONITORING, ED - Abnormal; Notable for the following components:   Glucose-Capillary 238 (*)    All other components within normal limits  CBG MONITORING, ED - Abnormal; Notable for the following components:   Glucose-Capillary 155 (*)    All other components within normal limits  CBG MONITORING, ED - Abnormal; Notable for the following components:   Glucose-Capillary 147 (*)    All other components within normal limits  CBG MONITORING, ED - Abnormal; Notable for the following components:   Glucose-Capillary 163 (*)    All other components within normal limits  TROPONIN I (HIGH SENSITIVITY) - Abnormal; Notable for the following components:   Troponin I (High Sensitivity) 73 (*)    All other components within normal limits  CBC WITH DIFFERENTIAL/PLATELET  BLOOD GAS, VENOUS  PREGNANCY, URINE  OSMOLALITY  URINALYSIS, ROUTINE W REFLEX MICROSCOPIC  TROPONIN I (HIGH SENSITIVITY)    EKG EKG Interpretation  Date/Time:  Thursday August 24 2022 16:03:30 EST Ventricular Rate:  119 PR Interval:  136 QRS Duration: 82 QT Interval:  328 QTC Calculation: 461 R Axis:   170 Text Interpretation: Sinus tachycardia Biatrial  enlargement Right axis deviation ST & T wave abnormality, consider lateral ischemia Abnormal ECG When  compared with ECG of 12-Aug-2022 13:42, PREVIOUS ECG IS PRESENT similar to prior tracing Confirmed by Malvin Johns [id number]) on 08/24/2022 4:27:07 PM  Radiology DG Chest Port 1 View  Result Date: 08/24/2022 CLINICAL DATA:  Shortness of breath EXAM: PORTABLE CHEST 1 VIEW COMPARISON:  Radiograph 08/18/2022, 08/12/2022 FINDINGS: Unchanged cardiomegaly. There is increased density in the left lung base with loss of silhouette with a left hemidiaphragm. Right lung is clear. No evidence of pneumothorax. No definite pleural effusion. No acute osseous abnormality. IMPRESSION: Left basilar airspace disease which could be atelectasis or pneumonia. Unchanged cardiomegaly. Electronically Signed   By: Maurine Simmering M.D.   On: 08/24/2022 16:50    Procedures Procedures    Medications Ordered in ED Medications  insulin regular, human (MYXREDLIN) 100 units/ 100 mL infusion (0 Units/hr Intravenous Stopped 08/24/22 2030)  dextrose 50 % solution 0-50 mL (has no administration in time range)  fentaNYL (SUBLIMAZE) injection 50 mcg (50 mcg Intravenous Given 08/24/22 1644)  sodium chloride 0.9 % bolus 250 mL (0 mLs Intravenous Stopped 08/24/22 1702)  cefTRIAXone (ROCEPHIN) 1 g in sodium chloride 0.9 % 100 mL IVPB (0 g Intravenous Stopped 08/24/22 1958)  azithromycin (ZITHROMAX) 500 mg in sodium chloride 0.9 % 250 mL IVPB (0 mg Intravenous Stopped 08/24/22 2058)  fentaNYL (SUBLIMAZE) injection 50 mcg (50 mcg Intravenous Given 08/24/22 1927)  sodium chloride 0.9 % bolus 250 mL (0 mLs Intravenous Stopped 08/24/22 2100)    ED Course/ Medical Decision Making/ A&P                           Medical Decision Making Amount and/or Complexity of Data Reviewed Labs: ordered. Radiology: ordered.  Risk Prescription drug management.   Patient is a 26 year old female with a complex medical history who presents with hyperglycemia and vomiting.  She had labs done from earlier today and states that she was at [facility]  earlier today to get her permacath placed after she left the hospital AMA yesterday.  Labs are checked there which were noted to have a markedly high blood sugar in the 700 range.  She was told to go to the emergency room but decided not to do that and noted that her blood sugar stayed high at home and she started having nausea and vomiting.  She presented here for evaluation.  She is noted to have hyperglycemia with hyperkalemia and hyponatremia as well.  There is no suggestions of DKA.  She is not acidotic.  She is vomiting.  She was given IV fluids cautiously and small amounts and started on insulin drip.  Her glucose improved with this.  ABG does not show an acidosis.  There is no anion gap.  Chest x-ray was performed which shows some questionable area of infiltrate versus atelectasis.  Initially I did give her dose of antibiotics.  However her WBC count is normal.  She is afebrile.  She has no symptoms of pneumonia.  Therefore I will not continue treatment.  I initially felt that she likely needs to be admitted to [facility] where she had just left yesterday.  I spoke with the heart failure cardiologist, Dr. Josue Hector, who had admitted her previously.  She felt that we could recheck the patient's labs and if her sodium is improved, she can likely be discharged.  She did not feel at this point that she has an  indication to readmit her to the hospital.  Patient does not want to be admitted to the hospital if she does not have to be.  I did recheck her blood work and her sodium has improved.  Her potassium is normalized.  Her glucose is stabilized.  She is off of the insulin drip.  She did have an episode of hypotension and was given a 250 cc bolus but that resolved with the 1 bolus and she has not had any further episodes of hypotension.  Overall she is feeling much better.  She is sitting up in the chair in says that she is ready to go home.  She has no ongoing shortness of breath or chest pain.  Her troponin  is mildly elevated which is likely from her kidney disease.  She has no ischemic changes on EKG.  No ongoing chest pain.  She was discharged home in good condition.  She was encouraged to have close follow-up with her cardiologist and nephrologist.  I did discuss with her talking to her nephrologist about rescheduling the permacath and rechecking her blood work to make sure her sodium is stabilized.  Return precautions were given.  Final Clinical Impression(s) / ED Diagnoses Final diagnoses:  Hyperglycemia  Hyponatremia    Rx / DC Orders ED Discharge Orders     None         Malvin Johns, MD 08/24/22 2258

## 2022-08-24 NOTE — ED Notes (Signed)
Having abd pain and elevated blood glucose levels. Was recently admitted to [facility] but left AMA per report from ED MD. Pt has removed insulin pump herself.

## 2022-08-24 NOTE — ED Notes (Signed)
Pt is aware she need a urine sample 

## 2022-08-24 NOTE — ED Notes (Signed)
CBG 397mg /dl

## 2022-08-25 LAB — OSMOLALITY: Osmolality: 291 mOsm/kg (ref 275–295)

## 2022-08-28 ENCOUNTER — Ambulatory Visit (HOSPITAL_COMMUNITY)
Admission: EM | Admit: 2022-08-28 | Discharge: 2022-08-28 | Disposition: A | Discharge status: Home / Self Care | Payer: Medicaid Other | Attending: Psychiatry | Admitting: Psychiatry

## 2022-08-28 DIAGNOSIS — N186 End stage renal disease: Secondary | ICD-10-CM | POA: Insufficient documentation

## 2022-08-28 DIAGNOSIS — I509 Heart failure, unspecified: Secondary | ICD-10-CM | POA: Insufficient documentation

## 2022-08-28 DIAGNOSIS — R9431 Abnormal electrocardiogram [ECG] [EKG]: Secondary | ICD-10-CM | POA: Insufficient documentation

## 2022-08-28 DIAGNOSIS — F419 Anxiety disorder, unspecified: Secondary | ICD-10-CM

## 2022-08-28 DIAGNOSIS — E1122 Type 2 diabetes mellitus with diabetic chronic kidney disease: Secondary | ICD-10-CM | POA: Insufficient documentation

## 2022-08-28 NOTE — Discharge Summary (Signed)
Pt left facility after triage and was seen by provider. Pt left before receiving her AVS. Pt is discharged at this time.

## 2022-08-28 NOTE — Progress Notes (Signed)
   08/28/22 1415  [place] (Walk-ins at [facility] only)  How Did You Hear About [place]? Self  What Is the Reason for Your Visit/Call Today? Patient is a 27 y.o. female with a history of anxiety, with worsening panic attacks recently.  She reports she has " very bad health problems" to include, heart failure, kidney failure and diabetes.  She is struggling to cope with health issues, especially at age 81 when she is also caring for a 27 y.o.  Patient is tearful as she shares about her difficulty sleeping, fearing "something might happen to me and I'll end up in the hospital."  Patient's parents rotate staying with her and help with her 27 y.o.  Patient denies SI, HI, AVH or SA hx.  She is hoping to speak to a counselor today.  She has had difficulty finding a Social worker.  Patient informed that she will be provided with a list of counselors/psychiatrists that accept her insurance today.  How Long Has This Been Causing You Problems? > than 6 months  Have You Recently Had Any Thoughts About Hurting Yourself? No  Are You Planning to Commit Suicide/Harm Yourself At This time? No  Have you Recently Had Thoughts About [place]? No  Are You Planning To Harm Someone At This Time? No  Are you currently experiencing any auditory, visual or other hallucinations? No  Have You Used Any Alcohol or Drugs in the Past 24 Hours? No  Do you have any current medical co-morbidities that require immediate attention? Yes  Please describe current medical co-morbidities that require immediate attention: Pt reports hx of heart failure, kidney failure and diabetes - stating her health is "very bad" - ongoing issues and no emergent concerns  Clinician description of patient physical appearance/behavior: Patient appears depressed and is tearful regarding recent panic episodes.  She is pleasant, cooperative AAOx5  What Do You Feel Would Help You the Most Today? Treatment for Depression or other mood problem  If access  to [facility] was not available, would you have sought care in the Emergency Department? No  Determination of Need Routine (7 days)  Options For Referral Medication Management;Outpatient Therapy

## 2022-08-28 NOTE — Discharge Instructions (Addendum)
The suicide prevention education provided includes the following: Suicide risk factors Suicide prevention and interventions National Suicide Hotline telephone number [facility] assessment telephone number [facility] Emergency Assistance [place] and/or Residential Mobile Crisis Unit telephone number   Request made of family/significant other to: Remove weapons (e.g., guns, rifles, knives), all items previously/currently identified as safety concern.   Remove drugs/medications (over the counter, prescriptions, illicit drugs), all items previously/currently identified as a safety concern.    Please contact one of the following facilities to start medication management and therapy services:   [facility] ([place])  [place], [place]  [address] Phone: [phone]  [facility]  [address], [place] [address] Phone: [phone]  [facility]  [address]  [place], [place] [address]  [facility]  [address]  [place],  [address] Phone: [phone]

## 2022-08-28 NOTE — ED Provider Notes (Signed)
Behavioral [facility] Urgent Care Medical Screening Exam  Patient Name: Kathy Lewis MRN: [id number] Date of Evaluation: 08/28/22 Chief Complaint:   Diagnosis:  Final diagnoses:  Anxiety    History of Present illness: Kathy Lewis is a 27 y.o. female Kathy Lewis 27 y.o., female patient presented to [facility] as a walk in alone with complaints of "I have been having panic attacks".  Kathy Lewis, 27 y.o., female patient seen face to face by this provider, consulted with and chart reviewed on 08/28/22. Per chart review patient has a past psychiatric history of Bipolar 2 and anxiety. She has also been diagnosed with prolonged QT, ESRD,DM and CHF.  She is prescribed ativan 1 mg but states she does not take it, because she thought it was for nausea. She has no out patient psychiatric services in place. She lives with her mother, father and two year old child. She is unemployed. She denies any substance use.   On evaluation Kathy Lewis reports over the past few weeks she has been experiencing an increase in her anxiety to the point where she feels that she is having panic attacks. Her attacks come and go through out the day. They last from 3-5 min's at a time. She identifies her poor health and fear of being put back in the hospital as a triggering factor. She is seeking medication management and 1:1 therapy for anxiety/panic attacks.  During evaluation Kathy Lewis is observed sitting in the assessment room in no acute distress. She is oriented x 4, cooperative and attentive.   She endorses an increase in depression and anxiety. She appears anxious. She is denying SI/HI/AVH and easily verbally contracts for safety. She denies access to firearms/weapons.  Objectively there is no evidence of psychosis/mania or delusional thinking.  Patient is able to converse coherently, goal directed thoughts, no distractibility, or pre-occupation.  Patient answered question appropriately.    Educated patient on her  current prescription of ativan and how to take. Educated on adverse side effects. Also discussed PHP program and patient declined. She prefers 1:1 therapy. Provided out patient psychiatric resources including [facility] on the 2cd floor including open access walk in hours.   At this time Kathy Lewis is educated and verbalizes understanding of mental health resources and other crisis services in the community. She is instructed to call 911 and present to the nearest emergency room should she experience any suicidal/homicidal ideation, auditory/visual/hallucinations, or detrimental worsening of her mental health condition.  She was a also advised by Probation officer that she could call the toll-free phone on back of  insurance card to assist with identifying in network counselors and agencies or number on back of Medicaid card to speak with care coordinator  [place] [facility] from 08/24/2022 in [place] [facility] from 08/12/2022 in [place] [facility] from 08/11/2022 in [place] No Risk No Risk No Risk       Psychiatric Specialty Exam  Presentation  General Appearance:Appropriate for Environment; Casual  Eye Contact:Good  Speech:Clear and Coherent; Normal Rate  Speech Volume:Normal  Handedness:Right   Mood and Affect  Mood: Depressed; Anxious  Affect: Congruent   Thought Process  Thought Processes: Coherent  Descriptions of Associations:Intact  Orientation:Full (Time, Place and Person)  Thought Content:Logical    Hallucinations:None  Ideas of Reference:None  Suicidal Thoughts:No Without Intent; Without Plan; Without Means to Kathy Lewis  Homicidal Thoughts:No   Sensorium  Memory: Immediate Good;  Recent Good; Remote Good  Judgment: Good  Insight: Good   Executive Functions  Concentration: Good  Attention Span: Good  Recall: Good  Fund of  Knowledge: Good  Language: Good   Psychomotor Activity  Psychomotor Activity: Normal   Assets  Assets: Communication Skills; Desire for Improvement; Leisure Time; Resilience; Social Support   Sleep  Sleep: Poor  Number of hours:  2   No data recorded  Physical Exam: Physical Exam Vitals and nursing note reviewed.  Constitutional:      General: She is not in acute distress.    Appearance: Normal appearance. She is not ill-appearing.  HENT:     Head: Normocephalic.  Eyes:     General:        Right eye: No discharge.        Left eye: No discharge.     Conjunctiva/sclera: Conjunctivae normal.     Pupils: Pupils are equal, round, and reactive to light.  Cardiovascular:     Rate and Rhythm: Normal rate.  Pulmonary:     Effort: Pulmonary effort is normal. No respiratory distress.  Musculoskeletal:        General: Normal range of motion.     Cervical back: Normal range of motion.  Skin:    General: Skin is warm and dry.     Coloration: Skin is not jaundiced or pale.  Neurological:     Mental Status: She is alert and oriented to person, place, and time.  Psychiatric:        Attention and Perception: Attention and perception normal.        Mood and Affect: Affect normal. Mood is anxious and depressed.        Speech: Speech normal.        Behavior: Behavior normal. Behavior is cooperative.        Thought Content: Thought content normal.        Cognition and Memory: Cognition normal.        Judgment: Judgment normal.    Review of Systems  Constitutional: Negative.   HENT: Negative.    Eyes: Negative.   Respiratory: Negative.    Cardiovascular: Negative.   Musculoskeletal: Negative.   Skin: Negative.   Neurological: Negative.   Psychiatric/Behavioral:  Positive for depression. The patient is nervous/anxious.    Blood pressure 111/84, pulse (!) 112, temperature (!) 97.3 F (36.3 C), resp. rate 16, SpO2 99 %. There is no height or weight on file to  calculate BMI.  Musculoskeletal: Strength & Muscle Tone: within normal limits Gait & Station: normal Patient leans: N/A   [facility] Discharge Disposition for Follow up and Recommendations: Based on my evaluation the patient does not appear to have an emergency medical condition and can be discharged with resources and follow up care in outpatient services for Medication Management and Individual Therapy.   Discharge patient.  Educated patient on her current prescription of ativan and how to take. Educated on adverse side effects. Also discussed PHP program and patient declined. She prefers 1:1 therapy. Provided out patient psychiatric resources including [facility] on the 2cd floor including open access walk in hours.   Patient left before being given AVS and before pulse could be rechecked.      Revonda Humphrey, NP 08/28/2022, 2:24 PM

## 2022-08-29 ENCOUNTER — Encounter: Payer: Self-pay | Admitting: Gastroenterology

## 2022-08-29 ENCOUNTER — Ambulatory Visit (INDEPENDENT_AMBULATORY_CARE_PROVIDER_SITE_OTHER): Payer: Medicaid Other | Admitting: Gastroenterology

## 2022-08-29 VITALS — BP 110/64 | HR 104 | Ht 60.0 in | Wt 120.6 lb

## 2022-08-29 DIAGNOSIS — R112 Nausea with vomiting, unspecified: Secondary | ICD-10-CM

## 2022-08-29 DIAGNOSIS — K591 Functional diarrhea: Secondary | ICD-10-CM

## 2022-08-29 NOTE — Progress Notes (Unsigned)
HPI : Kathy Lewis is a very pleasant 27 year old female with a history of type 1 diabetes, end-stage renal disease on peritoneal dialysis and systolic heart failure who presents for follow-up of persistent severe nausea, vomiting and diarrhea.  Please see HPI below for description of her symptoms.  Her nausea and vomiting symptoms seemed consistent with cyclic vomiting syndrome, but her diarrhea was not typical for this disorder.  A gut brain axis disorder was suspected and at her last visit in September, she was started on amitriptyline 10 mg nightly.  Today, the patient reports doing much better.  Her diarrhea has completely resolved.  She is having formed stools daily, no urgency.  No liquid stools.  She does still have some episodes of nausea and vomiting, but it is much less often, and she thinks that is related to her fluid status, which has been difficult to manage, and resulted in another hospital admission over Christmas.  She thinks that her dry weight had been overestimated, which was the reason for the difficulty managing her fluid status.  At her last visit she was also recommended to try to wean off Protonix.  She was able to wean off, but still does have occasional reflux symptoms.  She takes Protonix as needed now, maybe once or twice in the past month    From May 25, 2022: HPI : Kathy Lewis is a very pleasant 27 year old female with a history of type 1 diabetes, end-stage renal disease on peritoneal dialysis and systolic heart failure who presents to clinic for follow-up for severe nausea, vomiting and diarrhea.  She says she started having these symptoms in April of this year.  Prior to that, she denied any chronic GI symptoms.  She denied any major stressors or life changes around that time.  She started peritoneal dialysis in Sept 2022. She has been seen in the ER and admitted twice in the past few months for nausea and vomiting and DKA.  She underwent an EGD August 28  which was completely normal with normal gastric biopsies.  A colonoscopy was also planned, but was incomplete due to copious solid stool in the colon.  A gastric emptying study was normal in June 2023.  She has had multiple normal Cts. Today, she reports ongoing issues with nausea, vomiting and diarrhea.  These symptoms are episodic and she denies having problems in between episodes.  These episodes typically last anywhere from 3 days to a week in duration.  She estimates having 1-2 episodes per month.   Her last episode was 3 days and was characterized by just diarrhea for three days.  She did not have nausea/vomiting, but usually these symptoms go together. When she has diarrhea she will sometimes go >10 times in a day.  She has urgency but not incontinence.  No blood in stool.  She denies significant abdominal pain, but does report bloating and abdominal discomfort She has lost weight since these symptoms started, going from 140lbs to 126lbs. In between the diarrhea episodes she has normal formed bowel movements. She has not identified any dietary triggers for these episodes.  She follows a fluid restricted/low sodium diet at baseline.  Her appetite is good in between episodes.   In between episodes she has bowel movements daily.  Stool formed and solid.  No blood   EGD April 24, 2022 Normal, with normal gastric biopsies  Colonoscopy April 24, 2022 Incomplete due to copious solid stool  Gastric emptying study June 2023  Normal  Past Medical History:  Diagnosis Date   Anemia    Anxiety    Bipolar 2 disorder (HCC)    Chronic kidney disease    Chronic systolic (congestive) heart failure (HCC)    Depression    DKA (diabetic ketoacidoses)    ESRD on peritoneal dialysis ([place])    HSV infection    on valtrex   Hypokalemia    Leukocytosis    Migraine    Noncompliance with medication regimen    Preeclampsia    Prolonged QT syndrome    Severe anemia    Type 1 diabetes mellitus ([place])       Past Surgical History:  Procedure Laterality Date   BIOPSY  04/24/2022   Procedure: BIOPSY;  Surgeon: [name], MD;  Location: [facility];  Service: Gastroenterology;;   CARDIAC CATHETERIZATION     COLONOSCOPY WITH PROPOFOL N/A 04/24/2022   Procedure: COLONOSCOPY WITH PROPOFOL;  Surgeon: [name], MD;  Location: [facility];  Service: Gastroenterology;  Laterality: N/A;   DILATION AND EVACUATION N/A 10/22/2019   Procedure: ULTRASOUND GUIDED DILATATION AND EVACUATION;  Surgeon: [name], MD;  Location: [facility];  Service: Gynecology;  Laterality: N/A;   ESOPHAGOGASTRODUODENOSCOPY (EGD) WITH PROPOFOL N/A 04/24/2022   Procedure: ESOPHAGOGASTRODUODENOSCOPY (EGD) WITH PROPOFOL;  Surgeon: [name], MD;  Location: [facility];  Service: Gastroenterology;  Laterality: N/A;   NO PAST SURGERIES     peritoneal dialysis catheter insertion     RENAL BIOPSY     Family History  Adopted: Yes  Problem Relation Age of Onset   Heart disease Neg Hx    Social History   Tobacco Use   Smoking status: Former    Packs/day: 1.00    Years: 2.00    Total pack years: 2.00    Types: Cigars, Cigarettes   Smokeless tobacco: Never  Vaping Use   Vaping Use: Never used  Substance Use Topics   Alcohol use: No   Drug use: No   Current Outpatient Medications  Medication Sig Dispense Refill   acetaminophen (TYLENOL) 500 MG tablet Take 1,000 mg by mouth every 6 (six) hours as needed for headache (pain).     carvedilol (COREG) 12.5 MG tablet Take 12.5 mg by mouth 2 (two) times daily with a meal.     chlorpheniramine-HYDROcodone (TUSSIONEX) 10-8 MG/5ML Take 5 mLs by mouth every 12 (twelve) hours as needed for cough. 70 mL 0   clonazePAM (KLONOPIN) 0.5 MG tablet Take 1 tablet (0.5 mg total) by mouth 2 (two) times daily as needed for anxiety. 20 tablet 0   hydrALAZINE (APRESOLINE) 100 MG tablet Take 100 mg by mouth 3 (three) times daily.     insulin lispro (HUMALOG)  100 UNIT/ML injection Inject 0.5 Units into the skin See admin instructions. Via Insulin Pump - 1/2 unit per hour     isosorbide mononitrate (IMDUR) 30 MG 24 hr tablet Take 30 mg by mouth in the morning and at bedtime.     LORazepam (ATIVAN) 1 MG tablet Take 0.5 tablets (0.5 mg total) by mouth 3 (three) times daily as needed. 15 tablet 0   magnesium oxide (MAG-OX) 400 MG tablet Take 400 mg by mouth 2 (two) times daily.     methocarbamol (ROBAXIN) 500 MG tablet Take 500 mg by mouth every 8 (eight) hours as needed for muscle spasms.     ondansetron (ZOFRAN) 4 MG tablet Take 1 tablet (4 mg total) by mouth every 6 (six) hours. (Patient  taking differently: Take 4 mg by mouth every 6 (six) hours as needed for nausea or vomiting.) 90 tablet 3   pantoprazole (PROTONIX) 40 MG tablet Take 1 tablet (40 mg total) by mouth 2 (two) times daily before a meal. 60 tablet 3   Vitamin D, Ergocalciferol, (DRISDOL) 1.25 MG (50000 UNIT) CAPS capsule Take 50,000 Units by mouth every Sunday.     amitriptyline (ELAVIL) 10 MG tablet Take 1 tablet (10 mg total) by mouth at bedtime. 60 tablet 1   atorvastatin (LIPITOR) 40 MG tablet Take 40 mg by mouth daily.     butalbital-acetaminophen-caffeine (FIORICET) 50-325-40 MG tablet Take 1 tablet by mouth daily as needed for headache.     calcitRIOL (ROCALTROL) 0.25 MCG capsule Take 0.25 mcg by mouth every morning.     calcium carbonate (TUMS - DOSED IN MG ELEMENTAL CALCIUM) 500 MG chewable tablet Take 2 tablets by mouth 3 (three) times daily with meals.     No current facility-administered medications for this visit.   Allergies  Allergen Reactions   Cantaloupe Extract Allergy Skin Test Itching    Mouth itching     Strawberry Extract Itching    Mouth itches   Citrullus Vulgaris Itching    Makes mouth itch , ALL melons    Nsaids Other (See Comments)    Avoid per nephrology Other reaction(s): Other Avoid per nephrology Avoid per nephrology     Review of Systems: All  systems reviewed and negative except where noted in HPI.    DG Chest Port 1 View  Result Date: 08/24/2022 CLINICAL DATA:  Shortness of breath EXAM: PORTABLE CHEST 1 VIEW COMPARISON:  Radiograph 08/18/2022, 08/12/2022 FINDINGS: Unchanged cardiomegaly. There is increased density in the left lung base with loss of silhouette with a left hemidiaphragm. Right lung is clear. No evidence of pneumothorax. No definite pleural effusion. No acute osseous abnormality. IMPRESSION: Left basilar airspace disease which could be atelectasis or pneumonia. Unchanged cardiomegaly. Electronically Signed   By: Maurine Simmering M.D.   On: 08/24/2022 16:50   DG Chest Portable 1 View  Result Date: 08/12/2022 CLINICAL DATA:  Pain. EXAM: PORTABLE CHEST 1 VIEW COMPARISON:  CT scan of the chest, abdomen, and pelvis and chest x-ray August 11, 2022 FINDINGS: Stable enlargement of the cardiomediastinal silhouette. The hila, mediastinum, lungs, and pleura are otherwise unremarkable. IMPRESSION: Stable enlargement of the cardiomediastinal silhouette consistent with no cardiomegaly and pericardial effusion. No other abnormalities. Electronically Signed   By: Dorise Bullion III M.D.   On: 08/12/2022 11:47   CT CHEST ABDOMEN PELVIS WO CONTRAST  Result Date: 08/11/2022 CLINICAL DATA:  27 year old with acute abdominal pain. EXAM: CT CHEST, ABDOMEN AND PELVIS WITHOUT CONTRAST TECHNIQUE: Multidetector CT imaging of the chest, abdomen and pelvis was performed following the standard protocol without IV contrast. RADIATION DOSE REDUCTION: This exam was performed according to the departmental dose-optimization program which includes automated exposure control, adjustment of the mA and/or kV according to patient size and/or use of iterative reconstruction technique. COMPARISON:  CT abdomen pelvis 06/30/2022 and chest CT 11/22/2021 FINDINGS: CT CHEST FINDINGS Cardiovascular: Cardiomegaly with small to moderate amount of pericardial fluid.  Mediastinum/Nodes: Visualized thyroid tissue is unremarkable. No significant lymph node enlargement in the mediastinum or hila but limited evaluation without intravascular contrast. No axillary lymph node enlargement. Lungs/Pleura: No pleural effusions. Trachea and mainstem bronchi are patent. Volume loss in the left lower lobe which is chronic. Subtle ground-glass densities in the lungs may be related to areas atelectasis and/or air  trapping. No significant airspace disease or lung consolidation in the lungs. Musculoskeletal: No acute bone abnormality. CT ABDOMEN PELVIS FINDINGS Hepatobiliary: Liver is prominent for size and stable. Normal appearance of the gallbladder. Pancreas: Unremarkable. No pancreatic ductal dilatation or surrounding inflammatory changes. Spleen: Normal in size without focal abnormality. Adrenals/Urinary Tract: Normal adrenal glands. Negative for kidney stones. Normal appearance of both kidneys without hydronephrosis. Normal appearance of the urinary bladder. Stomach/Bowel: Normal stomach. No bowel dilatation. No focal bowel inflammation. Moderate amount of stool throughout the colon. There appears to be a normal appendix containing gas. Vascular/Lymphatic: Normal caliber of the abdominal aorta. Atherosclerotic calcifications in the proximal femoral arteries. No lymph node enlargement in the abdomen or pelvis. Reproductive: Uterus and bilateral adnexa are unremarkable. Other: Small amount of ascites in the abdomen and pelvis likely associated with the peritoneal dialysis catheter which is coiled in the left anterior lower abdomen. Mild subcutaneous edema. Mild mesenteric edema. Musculoskeletal: No acute bone abnormality. IMPRESSION: 1. Cardiomegaly with a small to moderate amount of pericardial fluid. 2. Small amount of ascites in the abdomen and pelvis likely associated with the peritoneal dialysis catheter. 3. Subcutaneous and mesenteric edema could be related to fluid overload state. 4. No  acute abnormality in the abdomen or pelvis. Electronically Signed   By: Markus Daft M.D.   On: 08/11/2022 16:25   DG Chest Portable 1 View  Result Date: 08/11/2022 CLINICAL DATA:  Chest pain and shortness of breath. EXAM: PORTABLE CHEST 1 VIEW COMPARISON:  Chest radiographs earlier today FINDINGS: The cardiac silhouette remains prominently enlarged, and there is mild prominence of the central pulmonary arteries. No airspace consolidation, overt pulmonary edema, large pleural effusion, or pneumothorax is identified. No acute osseous abnormality is seen. IMPRESSION: Cardiomegaly. No evidence of acute airspace disease. Electronically Signed   By: Logan Bores M.D.   On: 08/11/2022 15:25   DG Chest 1 View  Result Date: 08/11/2022 CLINICAL DATA:  Chest pain and syncope. EXAM: CHEST  1 VIEW COMPARISON:  07/28/2022 FINDINGS: Shallow inspiration. Diffuse cardiac enlargement. No vascular congestion or edema. Suggestion of focal infiltration in the right lung base, possibly pneumonia. No pleural effusions. No pneumothorax. Mediastinal contours appear intact. IMPRESSION: 1. Cardiac enlargement. 2. Focal infiltration suggested in the right lung base, possibly pneumonia. Electronically Signed   By: Lucienne Capers M.D.   On: 08/11/2022 00:31    Physical Exam: BP 110/64   Pulse (!) 104   Ht 5' (1.524 m)   Wt 120 lb 9.6 oz (54.7 kg)   SpO2 98%   BMI 23.55 kg/m  Constitutional: Pleasant,well-developed, African-American female in no acute distress. HEENT: Normocephalic and atraumatic. Conjunctivae are normal. No scleral icterus. Neck supple.  Cardiovascular: Normal rate, regular rhythm.  Pulmonary/chest: Effort normal and breath sounds normal. No wheezing, rales or rhonchi. Abdominal: Soft, nondistended, nontender. Bowel sounds active throughout. There are no masses palpable. No hepatomegaly.  Peritoneal dialysis catheter placed Extremities: no edema Neurological: Alert and oriented to person place and  time. Skin: Skin is warm and dry. No rashes noted. Psychiatric: Normal mood and affect. Behavior is normal.  CBC    Component Value Date/Time   WBC 6.7 08/24/2022 1607   RBC 4.56 08/24/2022 1607   HGB 15.6 (H) 08/24/2022 1805   HCT 46.0 08/24/2022 1805   HCT 23.0 (L) 10/18/2019 0534   PLT 228 08/24/2022 1607   MCV 91.7 08/24/2022 1607   MCH 30.9 08/24/2022 1607   MCHC 33.7 08/24/2022 1607   RDW 12.9 08/24/2022 1607  LYMPHSABS 0.7 08/24/2022 1607   MONOABS 0.5 08/24/2022 1607   EOSABS 0.1 08/24/2022 1607   BASOSABS 0.0 08/24/2022 1607    CMP     Component Value Date/Time   NA 129 (L) 08/24/2022 2112   K 5.0 08/24/2022 2112   CL 97 (L) 08/24/2022 2112   CO2 20 (L) 08/24/2022 2112   GLUCOSE 143 (H) 08/24/2022 2112   BUN 42 (H) 08/24/2022 2112   CREATININE 8.17 (H) 08/24/2022 2112   CALCIUM 7.2 (L) 08/24/2022 2112   PROT 7.0 08/24/2022 1607   ALBUMIN 3.4 (L) 08/24/2022 1607   AST 24 08/24/2022 1607   ALT 11 08/24/2022 1607   ALKPHOS 106 08/24/2022 1607   BILITOT 0.6 08/24/2022 1607   GFRNONAA 6 (L) 08/24/2022 2112   GFRAA 52 (L) 10/28/2019 1933     ASSESSMENT AND PLAN: 27 year old female with type 1 diabetes, end-stage renal disease on peritoneal dialysis in systolic heart failure, with several months of episodic nausea, vomiting and diarrhea of unclear etiology, suspected cyclic vomiting syndrome.  She was started on amitriptyline in late September, and her symptoms have improved significantly.  Her diarrhea is completely resolved, and her nausea vomiting episodes are much less often and less severe.  Although I am not entirely certain that the amitriptyline resulted in her clinical improvement, it is possible, therefore I recommend we continue it for the time being.  If her symptoms remain well-controlled, we can consider weaning off and discontinuing medication, but I would not recommend this in the next year.  She is tolerating the medication well without any  significant bothersome side effects. Agree with patient continuing as needed Protonix as she says it does help, would avoid daily use given infrequent symptoms and absence of any objective evidence of reflux on EGD.  She could also switch over to Pepcid.  Chronic episodic nausea, vomiting and diarrhea - Continue amitriptyline 10 mg nightly for now  GERD - Continue as needed Protonix; consider switching to Pepcid when out of Protonix  Follow-up as needed.  If symptoms well-controlled over the next year, consider weaning off amitriptyline.  Mohammedali Bedoy E. Candis Schatz, MD [facility]   No ref. provider found

## 2022-08-29 NOTE — Patient Instructions (Signed)
_______________________________________________________  If you are age 27 or older, your body mass index should be between 23-30. Your Body mass index is 23.55 kg/m. If this is out of the aforementioned range listed, please consider follow up with your Primary Care Provider.  If you are age 31 or younger, your body mass index should be between 19-25. Your Body mass index is 23.55 kg/m. If this is out of the aformentioned range listed, please consider follow up with your Primary Care Provider.   Continue Elavil at night time.  Continue Protonix as needed.  The [facility] providers would like to encourage you to use [facility] to communicate with providers for non-urgent requests or questions.  Due to long hold times on the telephone, sending your provider a message by [facility] may be a faster and more efficient way to get a response.  Please allow 48 business hours for a response.  Please remember that this is for non-urgent requests.   It was a pleasure to see you today!  Thank you for trusting me with your gastrointestinal care!    Scott E.Candis Schatz, MD

## 2022-08-30 ENCOUNTER — Ambulatory Visit (HOSPITAL_COMMUNITY): Payer: Medicaid Other | Admitting: Physician Assistant

## 2022-08-30 ENCOUNTER — Ambulatory Visit (INDEPENDENT_AMBULATORY_CARE_PROVIDER_SITE_OTHER): Payer: Medicaid Other | Admitting: Psychiatry

## 2022-08-30 ENCOUNTER — Encounter (HOSPITAL_COMMUNITY): Payer: Self-pay | Admitting: Psychiatry

## 2022-08-30 ENCOUNTER — Ambulatory Visit (INDEPENDENT_AMBULATORY_CARE_PROVIDER_SITE_OTHER): Payer: Medicaid Other | Admitting: Clinical

## 2022-08-30 DIAGNOSIS — F32A Depression, unspecified: Secondary | ICD-10-CM | POA: Diagnosis not present

## 2022-08-30 DIAGNOSIS — F331 Major depressive disorder, recurrent, moderate: Secondary | ICD-10-CM

## 2022-08-30 DIAGNOSIS — F411 Generalized anxiety disorder: Secondary | ICD-10-CM

## 2022-08-30 MED ORDER — HYDROXYZINE HCL 10 MG PO TABS: 10.0000 mg | ORAL_TABLET | Freq: Three times a day (TID) | ORAL | 3 refills | Status: DC | PRN

## 2022-08-30 MED ORDER — AMITRIPTYLINE HCL 50 MG PO TABS: 50.0000 mg | ORAL_TABLET | Freq: Every day | ORAL | 3 refills | Status: AC

## 2022-08-30 NOTE — Progress Notes (Signed)
Comprehensive Clinical Assessment (CCA) Note  08/30/2022 Kathy Lewis [id number]  Chief Complaint:  Chief Complaint  Patient presents with   Depression   Anxiety   Panic Attack   Visit Diagnosis:  MDD, recurrent episode, moderate with anxious distress   Interpretive Summary:  Client is a 27 year old female presenting to the [facility] as a walk-in for outpatient services.  Client was referred by [facility] behavioral health urgent care following triage on August 28, 2022.  Client presented to the urgent care due to symptoms of worsening anxiety and depression related to chronic health issues.  Client reported she has been depressed with her depression and anxiety since 2018.  Client reported her symptoms primarily pertaining to her health diagnosis of heart failure, kidney failure, and diabetes.  Client reported she constantly worries about what will happen to her as she is also raising her 46-year-old daughter.  Client reported she has support from her parents who stay with her and help with her child.  Client reported her anxiety causes her to physically shake, crying spells, and insomnia.  Client reported no prior history of inpatient mental health treatment and denied illicit substance use. Client presented oriented times five, appropriately dressed, and friendly. Client denied hallucinations, delusions, suicidal and homicidal ideations. Client was screened for pain, nutrition, [place] suicide severity and the following SDOH:    08/30/2022    8:46 AM  GAD 7 : Generalized Anxiety Score  Nervous, Anxious, on Edge 3  Control/stop worrying 3  Worry too much - different things 3  Trouble relaxing 3  Restless 3  Easily annoyed or irritable 3  Afraid - awful might happen 3  Total GAD 7 Score 21  Anxiety Difficulty Very difficult     Flowsheet Row Counselor from 08/30/2022 in [facility]  PHQ-9 Total Score 14        Treatment recommendations: individual therapy, psychiatry with medication management   Therapist provided information on format of appointment (virtual or face to face).   The client was advised to call back or seek an in-person evaluation if the symptoms worsen or if the condition fails to improve as anticipated before the next scheduled appointment. Client was in agreement with treatment recommendations.   CCA Biopsychosocial Intake/Chief Complaint:  Client reported she is referred by [facility] urgent care due to depression and anxiety. Cliernet reporte it has been going on for 2 weeks now. Client reported due to her health issues heart failure, kidney and diabetes. Client reported she spent a week in the hospital and got no sleep whatsoever which really affected her. client reported she is scared she will go back to the hospital again.  Current Symptoms/Problems: Client reported depressed mood, crying spells, worry, feeling on edge, insomnia, hyperventilating  Patient Reported Schizophrenia/Schizoaffective Diagnosis in Past: No  Strengths: self motivation to improve depression and anxiety  Preferences: counseling and medication management  Abilities: vocalize problems and needs  Type of Services Patient Feels are Needed: counseling and psychiatry   Initial Clinical Notes/Concerns: No data recorded  Mental Health Symptoms Depression:   Change in energy/activity; Sleep (too much or little); Hopelessness; Tearfulness   Duration of Depressive symptoms:  Greater than two weeks   Mania:   None   Anxiety:    Difficulty concentrating; Tension; Worrying; Sleep   Psychosis:   None   Duration of Psychotic symptoms: No data recorded  Trauma:   None   Obsessions:   None   Compulsions:  None   Inattention:   None   Hyperactivity/Impulsivity:   None   Oppositional/Defiant Behaviors:   None   Emotional Irregularity:   None   Other Mood/Personality Symptoms:  No data  recorded   Mental Status Exam Appearance and self-care  Stature:   Small   Weight:   Average weight   Clothing:   Casual   Grooming:   Normal   Cosmetic use:   Age appropriate   Posture/gait:   Normal   Motor activity:   Not Remarkable   Sensorium  Attention:   Normal   Concentration:   Normal   Orientation:   X5   Recall/memory:   Normal   Affect and Mood  Affect:   Congruent   Mood:   Depressed   Relating  Eye contact:   Normal   Facial expression:   Depressed   Attitude toward examiner:   Cooperative   Thought and Language  Speech flow:  Clear and Coherent   Thought content:   Appropriate to Mood and Circumstances   Preoccupation:   None   Hallucinations:   None   Organization:  No data recorded  [employer] of Knowledge:   Good   Intelligence:   Average   Abstraction:   Normal   Judgement:   Good   Reality Testing:   Adequate   Insight:   Good   Decision Making:   Normal   Social Functioning  Social Maturity:   Responsible   Social Judgement:   Normal   Stress  Stressors:   Illness   Coping Ability:   Resilient; Overwhelmed   Skill Deficits:   Activities of daily living   Supports:   Family     Religion: Religion/Spirituality Are You A Religious Person?: No  Leisure/Recreation: Leisure / Recreation Do You Have Hobbies?: No  Exercise/Diet: Exercise/Diet Do You Exercise?: No Have You Gained or Lost A Significant Amount of Weight in the Past Six Months?: No Do You Follow a Special Diet?: No Do You Have Any Trouble Sleeping?: Yes   CCA Employment/Education Employment/Work Situation: Employment / Work Situation Employment Situation: Unemployed  Education: Education Is Patient Currently Attending School?: No Did Teacher, adult education From [employer]?: Yes Did Physicist, medical?: Yes What Type of College Degree Do you Have?: Client reported some college at [employer], recieved her  phlebotomoy cetification at [employer]   CCA Family/Childhood History Family and Relationship History: Family history Marital status: Single Does patient have children?: Yes How many children?: 1 How is patient's relationship with their children?: client has a 30 year old daughter  Childhood History:  Childhood History By whom was/is the patient raised?: Adoptive parents Additional childhood history information: Client reported she was raised in [place] by her adoptive parents. Client reported her childhood was good. Does patient have siblings?: Yes Number of Siblings: 3 Description of patient's current relationship with siblings: Client reported she has 3 biological siblings whom she has a relationship with Did patient suffer any verbal/emotional/physical/sexual abuse as a child?: No Did patient suffer from severe childhood neglect?: No Has patient ever been sexually abused/assaulted/raped as an adolescent or adult?: No Was the patient ever a victim of a crime or a disaster?: No Witnessed domestic violence?: No Has patient been affected by domestic violence as an adult?: No  Child/Adolescent Assessment:     CCA Substance Use Alcohol/Drug Use: Alcohol / Drug Use History of alcohol / drug use?: No history of alcohol / drug abuse  ASAM's:  Six Dimensions of Multidimensional Assessment  Dimension 1:  Acute Intoxication and/or Withdrawal Potential:      Dimension 2:  Biomedical Conditions and Complications:      Dimension 3:  Emotional, Behavioral, or Cognitive Conditions and Complications:     Dimension 4:  Readiness to Change:     Dimension 5:  Relapse, Continued use, or Continued Problem Potential:     Dimension 6:  Recovery/Living Environment:     ASAM Severity Score:    ASAM Recommended Level of Treatment:     Substance use Disorder (SUD)    Recommendations for Services/Supports/Treatments: Recommendations for  Services/Supports/Treatments Recommendations For Services/Supports/Treatments: Individual Therapy, Medication Management  DSM5 Diagnoses: Patient Active Problem List   Diagnosis Date Noted   Metabolic acidosis, increased anion gap 07/28/2022   Elevated LFTs 07/28/2022   Hyperglycemia 06/30/2022   DKA (diabetic ketoacidosis) ([place]) 06/28/2022   Hypertensive urgency 06/27/2022   DKA, type 1 ([place]) 06/26/2022   Nausea & vomiting 03/15/2022   Mixed diabetic hyperlipidemia associated with type 1 diabetes mellitus ([place]) 01/31/2022   Intractable hiccups 01/31/2022   Abdominal pain with vomiting 01/24/2022   Uncontrolled type 1 diabetes mellitus with hyperglycemia, with long-term current use of insulin ([place]) 01/24/2022   End-stage renal disease on peritoneal dialysis with hyperkalemia 11/23/2021   Abnormal CT of the chest 11/23/2021   Gastroparesis 11/22/2021   Pulmonary embolism ([place]) 11/22/2021   Hyperkalemia 08/11/2021   Bipolar 2 disorder ([place])    Essential hypertension    Chronic systolic CHF (congestive heart failure) ([place]) 07/11/2020   Hypoalbuminemia    Anxiety    Normocytic anemia    Prolonged QT interval    Dyslipidemia 05/18/2020   Preeclampsia 10/15/2019   Hypokalemia    Noncompliance with medications 12/18/2014   Intractable nausea and vomiting 12/18/2014    Patient Centered Plan: Patient is on the following Treatment Plan(s):  Depression   Referrals to Alternative Service(s): Referred to Alternative Service(s):   Place:   Date:   Time:    Referred to Alternative Service(s):   Place:   Date:   Time:    Referred to Alternative Service(s):   Place:   Date:   Time:    Referred to Alternative Service(s):   Place:   Date:   Time:      Collaboration of Care: Medication Management AEB [facility]  Patient/Guardian was advised Release of Information must be obtained prior to any record release in order to collaborate their care with an outside provider. Patient/Guardian was  advised if they have not already done so to contact the registration department to sign all necessary forms in order for [place] to release information regarding their care.   Consent: Patient/Guardian gives verbal consent for treatment and assignment of benefits for services provided during this visit. Patient/Guardian expressed understanding and agreed to proceed.   [place], LCSW

## 2022-08-30 NOTE — Progress Notes (Signed)
Psychiatric Initial Adult Assessment  Virtual Visit via Video Note  I connected with Kathy Lewis on 08/30/22 at  9:00 AM EST by a video enabled telemedicine application and verified that I am speaking with the correct person using two identifiers.  Location: Patient: Home Provider: Clinic   I discussed the limitations of evaluation and management by telemedicine and the availability of in person appointments. The patient expressed understanding and agreed to proceed.  I provided 45 minutes of non-face-to-face time during this encounter.   Patient Identification: Kathy Lewis MRN:  [id number] Date of Evaluation:  08/30/2022 Referral Source: [place] Chief Complaint:  " I have been having increased anxiety" Visit Diagnosis:    ICD-10-CM   1. Generalized anxiety disorder  F41.1 amitriptyline (ELAVIL) 50 MG tablet    hydrOXYzine (ATARAX) 10 MG tablet    2. Mild depression  F32.A amitriptyline (ELAVIL) 50 MG tablet      History of Present Illness: 27 year old female seen today for initial psychiatric evaluation.  She was referred to outpatient psychiatry by [facility] where she presented on 08/28/2022.  She has a psychiatric history of bipolar 2 and anxiety.  Patient is currently managed on amitriptyline 10 mg nightly, Klonopin 0.5 mg twice daily (prescribed by PCP), and Ativan 0.5 mg 3 times daily as needed (prescribed by PCP).  She informed Probation officer that she does not take Ativan or Klonopin frequently and notes that amitriptyline is somewhat effective in managing her psychiatric conditions.  Today she is well-groomed, pleasant, cooperative, and engaged in conversation.  She informed Probation officer that she has been having increased anxiety surrounding her health.  Patient notes that she is in kidney failure, has cardiac issues, and recently was hospitalized for a hyperglycemic episode with increased nausea and chest pain.  She informed Probation officer that she has been in the hospital numerous times and reports  that when she thinks about it it is overwhelming.  She notes that she finds hospitalizations traumatic.  A GAD was conducted and patient scored a 21.  A PHQ-9 was also conducted and patient scored a 14.  She reports that her sleep is poor noting that she sleeps 2 hours nightly.  She also reports that her appetite has been poor and notes that she has been losing weight.  She informed Probation officer that she believes that since being on dialysis for a year she has lost approximately 20 to 30 pounds.  Today she denies SI/HI/AVH, mania, paranoia.  Patient reports that she has stomach pains which she quantifies as a 7 out of 10.  She notes that she uses amitriptyline and Tylenol to help manage this pain.  Today she is agreeable to increasing amitriptyline 10 mg nightly to 50 mg nightly to help manage sleep, anxiety, and depression.  She is also agreeable to starting hydroxyzine 10 mg 3 times daily as needed to help manage anxiety.  She will continue all other medications as prescribed.  Provider informed patient that being on Klonopin and Ativan is not recommended.  She endorsed understanding.  She will follow-up with outpatient counseling for therapy.  No other concerns noted at this time.  Associated Signs/Symptoms: Depression Symptoms:  depressed mood, anhedonia, insomnia, psychomotor retardation, fatigue, feelings of worthlessness/guilt, difficulty concentrating, hopelessness, anxiety, panic attacks, loss of energy/fatigue, disturbed sleep, weight loss, increased appetite, (Hypo) Manic Symptoms:  Distractibility, Elevated Mood, Flight of Ideas, Irritable Mood, Anxiety Symptoms:  Excessive Worry, Psychotic Symptoms:   Denies PTSD Symptoms: Had a traumatic exposure:  Reports that being hospitalized causes increased anxiety  Past Psychiatric History: Bipolar 2 and anxiety  Previous Psychotropic Medications:  elavil, ativan, clonopin  Substance Abuse History in the last 12 months:   No.  Consequences of Substance Abuse: NA  Past Medical History:  Past Medical History:  Diagnosis Date   Anemia    Anxiety    Bipolar 2 disorder ([place])    Chronic kidney disease    Chronic systolic (congestive) heart failure (HCC)    Depression    DKA (diabetic ketoacidoses)    ESRD on peritoneal dialysis ([place])    HSV infection    on valtrex   Hypokalemia    Leukocytosis    Migraine    Noncompliance with medication regimen    Preeclampsia    Prolonged QT syndrome    Severe anemia    Type 1 diabetes mellitus ([place])     Past Surgical History:  Procedure Laterality Date   BIOPSY  04/24/2022   Procedure: BIOPSY;  Surgeon: [name], MD;  Location: [facility];  Service: Gastroenterology;;   CARDIAC CATHETERIZATION     COLONOSCOPY WITH PROPOFOL N/A 04/24/2022   Procedure: COLONOSCOPY WITH PROPOFOL;  Surgeon: [name], MD;  Location: [facility] ENDOSCOPY;  Service: Gastroenterology;  Laterality: N/A;   DILATION AND EVACUATION N/A 10/22/2019   Procedure: ULTRASOUND GUIDED DILATATION AND EVACUATION;  Surgeon: [name], MD;  Location: [facility] LD ORS;  Service: Gynecology;  Laterality: N/A;   ESOPHAGOGASTRODUODENOSCOPY (EGD) WITH PROPOFOL N/A 04/24/2022   Procedure: ESOPHAGOGASTRODUODENOSCOPY (EGD) WITH PROPOFOL;  Surgeon: [name], MD;  Location: [place];  Service: Gastroenterology;  Laterality: N/A;   NO PAST SURGERIES     peritoneal dialysis catheter insertion     RENAL BIOPSY      Family Psychiatric History: Unknown  Family History:  Family History  Adopted: Yes  Problem Relation Age of Onset   Heart disease Neg Hx     Social History:   Social History   Socioeconomic History   Marital status: Single    Spouse name: Not on file   Number of children: 1   Years of education: Not on file   Highest education level: Not on file  Occupational History   Occupation: unemployed   Occupation: n/a  Tobacco Use   Smoking status: Former     Packs/day: 1.00    Years: 2.00    Total pack years: 2.00    Types: Cigars, Cigarettes   Smokeless tobacco: Never  Vaping Use   Vaping Use: Never used  Substance and Sexual Activity   Alcohol use: No   Drug use: No   Sexual activity: Yes    Birth control/protection: None  Other Topics Concern   Not on file  Social History Narrative   Not on file   Social Determinants of Health   Financial Resource Strain: Not on file  Food Insecurity: No Food Insecurity (07/28/2022)   Hunger Vital Sign    Worried About Running Out of Food in the Last Year: Never true    Ran Out of Food in the Last Year: Never true  Transportation Needs: No Transportation Needs (07/28/2022)   PRAPARE - Hydrologist (Medical): No    Lack of Transportation (Non-Medical): No  Physical Activity: Not on file  Stress: Not on file  Social Connections: Not on file    Additional Social History: Patient resides in [place] with her two year old daughter. She is on disability. She denies alcohol, tobacco, or illegal drug use.  Allergies:   Allergies  Allergen Reactions   Cantaloupe Extract Allergy Skin Test Itching    Mouth itching     Strawberry Extract Itching    Mouth itches   Citrullus Vulgaris Itching    Makes mouth itch , ALL melons    Nsaids Other (See Comments)    Avoid per nephrology Other reaction(s): Other Avoid per nephrology Avoid per nephrology    Metabolic Disorder Labs: Lab Results  Component Value Date   HGBA1C 8.6 (H) 06/27/2022   MPG 200 06/27/2022   MPG 217.34 11/23/2021   No results found for: "PROLACTIN" No results found for: "CHOL", "TRIG", "HDL", "CHOLHDL", "VLDL", "LDLCALC" Lab Results  Component Value Date   TSH 1.756 10/23/2019    Therapeutic Level Labs: No results found for: "LITHIUM" No results found for: "CBMZ" No results found for: "VALPROATE"  Current Medications: Current Outpatient Medications  Medication Sig Dispense Refill    hydrOXYzine (ATARAX) 10 MG tablet Take 1 tablet (10 mg total) by mouth 3 (three) times daily as needed. 90 tablet 3   acetaminophen (TYLENOL) 500 MG tablet Take 1,000 mg by mouth every 6 (six) hours as needed for headache (pain).     amitriptyline (ELAVIL) 50 MG tablet Take 1 tablet (50 mg total) by mouth at bedtime. 30 tablet 3   atorvastatin (LIPITOR) 40 MG tablet Take 40 mg by mouth daily.     butalbital-acetaminophen-caffeine (FIORICET) 50-325-40 MG tablet Take 1 tablet by mouth daily as needed for headache.     calcitRIOL (ROCALTROL) 0.25 MCG capsule Take 0.25 mcg by mouth every morning.     calcium carbonate (TUMS - DOSED IN MG ELEMENTAL CALCIUM) 500 MG chewable tablet Take 2 tablets by mouth 3 (three) times daily with meals.     carvedilol (COREG) 12.5 MG tablet Take 12.5 mg by mouth 2 (two) times daily with a meal.     chlorpheniramine-HYDROcodone (TUSSIONEX) 10-8 MG/5ML Take 5 mLs by mouth every 12 (twelve) hours as needed for cough. 70 mL 0   clonazePAM (KLONOPIN) 0.5 MG tablet Take 1 tablet (0.5 mg total) by mouth 2 (two) times daily as needed for anxiety. 20 tablet 0   hydrALAZINE (APRESOLINE) 100 MG tablet Take 100 mg by mouth 3 (three) times daily.     insulin lispro (HUMALOG) 100 UNIT/ML injection Inject 0.5 Units into the skin See admin instructions. Via Insulin Pump - 1/2 unit per hour     isosorbide mononitrate (IMDUR) 30 MG 24 hr tablet Take 30 mg by mouth in the morning and at bedtime.     LORazepam (ATIVAN) 1 MG tablet Take 0.5 tablets (0.5 mg total) by mouth 3 (three) times daily as needed. 15 tablet 0   magnesium oxide (MAG-OX) 400 MG tablet Take 400 mg by mouth 2 (two) times daily.     methocarbamol (ROBAXIN) 500 MG tablet Take 500 mg by mouth every 8 (eight) hours as needed for muscle spasms.     ondansetron (ZOFRAN) 4 MG tablet Take 1 tablet (4 mg total) by mouth every 6 (six) hours. (Patient taking differently: Take 4 mg by mouth every 6 (six) hours as needed for nausea or  vomiting.) 90 tablet 3   pantoprazole (PROTONIX) 40 MG tablet Take 1 tablet (40 mg total) by mouth 2 (two) times daily before a meal. 60 tablet 3   Vitamin D, Ergocalciferol, (DRISDOL) 1.25 MG (50000 UNIT) CAPS capsule Take 50,000 Units by mouth every Sunday.     No current facility-administered medications for this  visit.    Musculoskeletal: Strength & Muscle Tone: within normal limits and telehealth visit Gait & Station: normal, telehealth visit Patient leans: N/A  Psychiatric Specialty Exam: Review of Systems  There were no vitals taken for this visit.There is no height or weight on file to calculate BMI.  General Appearance: Well Groomed  Eye Contact:  Good  Speech:  Clear and Coherent and Normal Rate  Volume:  Normal  Mood:  Anxious and Depressed  Affect:  Appropriate and Congruent  Thought Process:  Coherent, Goal Directed, and Linear  Orientation:  Full (Time, Place, and Person)  Thought Content:  WDL and Logical  Suicidal Thoughts:  No  Homicidal Thoughts:  No  Memory:  Immediate;   Good Recent;   Good Remote;   Good  Judgement:  Good  Insight:  Good  Psychomotor Activity:  Normal  Concentration:  Concentration: Good and Attention Span: Good  Recall:  Good  Fund of Knowledge:Good  Language: Good  Akathisia:  No  Handed:  Right  AIMS (if indicated):  not done  Assets:  Communication Skills Desire for Improvement Financial Resources/Insurance Housing Leisure Time Physical Health Social Support  ADL's:  Intact  Cognition: WNL  Sleep:  Poor   Screenings: GAD-7    Health and safety inspector from 08/30/2022 in [facility]  Total GAD-7 Score 21      PHQ2-9    Flowsheet Row Counselor from 08/30/2022 in [place]  PHQ-2 Total Score 3  PHQ-9 Total Score 14      Flowsheet Row Counselor from 08/30/2022 in [facility] ED from 08/24/2022 in [place] ED from 08/12/2022 in [place] No Risk No Risk No Risk       Assessment and Plan: Patient indorses increased anxiety, depression, and insomnia.  Today she is agreeable to increasing amitriptyline 10 mg to 50 mg to help manage anxiety, depression, and sleep.  She will also start hydroxyzine 10 mg 3 times daily as needed to help manage anxiety.  Patient will follow-up with outpatient counseling for therapy.  1. Generalized anxiety disorder  Increased- amitriptyline (ELAVIL) 50 MG tablet; Take 1 tablet (50 mg total) by mouth at bedtime.  Dispense: 30 tablet; Refill: 3 Start- hydrOXYzine (ATARAX) 10 MG tablet; Take 1 tablet (10 mg total) by mouth 3 (three) times daily as needed.  Dispense: 90 tablet; Refill: 3  2. Mild depression  Increased- amitriptyline (ELAVIL) 50 MG tablet; Take 1 tablet (50 mg total) by mouth at bedtime.  Dispense: 30 tablet; Refill: 3   Collaboration of Care: Other provider involved in patient's care AEB counselor and PCP  Patient/Guardian was advised Release of Information must be obtained prior to any record release in order to collaborate their care with an outside provider. Patient/Guardian was advised if they have not already done so to contact the registration department to sign all necessary forms in order for [place] to release information regarding their care.   Consent: Patient/Guardian gives verbal consent for treatment and assignment of benefits for services provided during this visit. Patient/Guardian expressed understanding and agreed to proceed.   Follow-up in 2 months Follow-up with therapy Salley Slaughter, NP 1/3/20249:19 AM

## 2022-09-06 ENCOUNTER — Encounter (HOSPITAL_BASED_OUTPATIENT_CLINIC_OR_DEPARTMENT_OTHER): Payer: Self-pay | Admitting: Urology

## 2022-09-06 ENCOUNTER — Other Ambulatory Visit: Payer: Self-pay

## 2022-09-06 ENCOUNTER — Emergency Department (HOSPITAL_BASED_OUTPATIENT_CLINIC_OR_DEPARTMENT_OTHER)
Admission: EM | Admit: 2022-09-06 | Discharge: 2022-09-06 | Disposition: A | Discharge status: Home / Self Care | Payer: Medicaid Other | Attending: Emergency Medicine | Admitting: Emergency Medicine

## 2022-09-06 DIAGNOSIS — E860 Dehydration: Secondary | ICD-10-CM

## 2022-09-06 DIAGNOSIS — Y92410 Unspecified street and highway as the place of occurrence of the external cause: Secondary | ICD-10-CM | POA: Insufficient documentation

## 2022-09-06 DIAGNOSIS — E1122 Type 2 diabetes mellitus with diabetic chronic kidney disease: Secondary | ICD-10-CM | POA: Insufficient documentation

## 2022-09-06 DIAGNOSIS — I132 Hypertensive heart and chronic kidney disease with heart failure and with stage 5 chronic kidney disease, or end stage renal disease: Secondary | ICD-10-CM | POA: Diagnosis not present

## 2022-09-06 DIAGNOSIS — I509 Heart failure, unspecified: Secondary | ICD-10-CM | POA: Insufficient documentation

## 2022-09-06 DIAGNOSIS — E875 Hyperkalemia: Secondary | ICD-10-CM | POA: Insufficient documentation

## 2022-09-06 DIAGNOSIS — N186 End stage renal disease: Secondary | ICD-10-CM | POA: Diagnosis not present

## 2022-09-06 DIAGNOSIS — S39012A Strain of muscle, fascia and tendon of lower back, initial encounter: Secondary | ICD-10-CM | POA: Insufficient documentation

## 2022-09-06 DIAGNOSIS — S3992XA Unspecified injury of lower back, initial encounter: Secondary | ICD-10-CM | POA: Diagnosis present

## 2022-09-06 DIAGNOSIS — Z79899 Other long term (current) drug therapy: Secondary | ICD-10-CM | POA: Insufficient documentation

## 2022-09-06 DIAGNOSIS — Z794 Long term (current) use of insulin: Secondary | ICD-10-CM | POA: Insufficient documentation

## 2022-09-06 DIAGNOSIS — Z992 Dependence on renal dialysis: Secondary | ICD-10-CM | POA: Diagnosis not present

## 2022-09-06 LAB — COMPREHENSIVE METABOLIC PANEL
ALT: 21 U/L (ref 0–44)
AST: 20 U/L (ref 15–41)
Albumin: 3.3 g/dL — ABNORMAL LOW (ref 3.5–5.0)
Alkaline Phosphatase: 90 U/L (ref 38–126)
Anion gap: 19 — ABNORMAL HIGH (ref 5–15)
BUN: 97 mg/dL — ABNORMAL HIGH (ref 6–20)
CO2: 16 mmol/L — ABNORMAL LOW (ref 22–32)
Calcium: 7.5 mg/dL — ABNORMAL LOW (ref 8.9–10.3)
Chloride: 98 mmol/L (ref 98–111)
Creatinine, Ser: 12.18 mg/dL — ABNORMAL HIGH (ref 0.44–1.00)
GFR, Estimated: 4 mL/min — ABNORMAL LOW (ref >60)
Glucose, Bld: 142 mg/dL — ABNORMAL HIGH (ref 70–99)
Potassium: 6.5 mmol/L (ref 3.5–5.1)
Sodium: 133 mmol/L — ABNORMAL LOW (ref 135–145)
Total Bilirubin: 0.4 mg/dL (ref 0.3–1.2)
Total Protein: 7.1 g/dL (ref 6.5–8.1)

## 2022-09-06 LAB — CBC WITH DIFFERENTIAL/PLATELET
Abs Immature Granulocytes: 0.02 10*3/uL (ref 0.00–0.07)
Basophils Absolute: 0 10*3/uL (ref 0.0–0.1)
Basophils Relative: 1 %
Eosinophils Absolute: 0 10*3/uL (ref 0.0–0.5)
Eosinophils Relative: 0 %
HCT: 34 % — ABNORMAL LOW (ref 36.0–46.0)
Hemoglobin: 11.2 g/dL — ABNORMAL LOW (ref 12.0–15.0)
Immature Granulocytes: 0 %
Lymphocytes Relative: 13 %
Lymphs Abs: 0.8 10*3/uL (ref 0.7–4.0)
MCH: 31.9 pg (ref 26.0–34.0)
MCHC: 32.9 g/dL (ref 30.0–36.0)
MCV: 96.9 fL (ref 80.0–100.0)
Monocytes Absolute: 0.3 10*3/uL (ref 0.1–1.0)
Monocytes Relative: 5 %
Neutro Abs: 4.8 10*3/uL (ref 1.7–7.7)
Neutrophils Relative %: 81 %
Platelets: 337 10*3/uL (ref 150–400)
RBC: 3.51 MIL/uL — ABNORMAL LOW (ref 3.87–5.11)
RDW: 14.9 % (ref 11.5–15.5)
WBC: 5.8 10*3/uL (ref 4.0–10.5)
nRBC: 0 % (ref 0.0–0.2)

## 2022-09-06 LAB — I-STAT VENOUS BLOOD GAS, ED
Acid-base deficit: 7 mmol/L — ABNORMAL HIGH (ref 0.0–2.0)
Bicarbonate: 18.5 mmol/L — ABNORMAL LOW (ref 20.0–28.0)
Calcium, Ion: 0.92 mmol/L — ABNORMAL LOW (ref 1.15–1.40)
HCT: 36 % (ref 36.0–46.0)
Hemoglobin: 12.2 g/dL (ref 12.0–15.0)
O2 Saturation: 90 %
Patient temperature: 97.7
Potassium: 6.9 mmol/L (ref 3.5–5.1)
Sodium: 131 mmol/L — ABNORMAL LOW (ref 135–145)
TCO2: 20 mmol/L — ABNORMAL LOW (ref 22–32)
pCO2, Ven: 35.1 mmHg — ABNORMAL LOW (ref 44–60)
pH, Ven: 7.328 (ref 7.25–7.43)
pO2, Ven: 61 mmHg — ABNORMAL HIGH (ref 32–45)

## 2022-09-06 LAB — BASIC METABOLIC PANEL
Anion gap: 19 — ABNORMAL HIGH (ref 5–15)
BUN: 102 mg/dL — ABNORMAL HIGH (ref 6–20)
CO2: 18 mmol/L — ABNORMAL LOW (ref 22–32)
Calcium: 7.4 mg/dL — ABNORMAL LOW (ref 8.9–10.3)
Chloride: 95 mmol/L — ABNORMAL LOW (ref 98–111)
Creatinine, Ser: 12.25 mg/dL — ABNORMAL HIGH (ref 0.44–1.00)
GFR, Estimated: 4 mL/min — ABNORMAL LOW (ref >60)
Glucose, Bld: 90 mg/dL (ref 70–99)
Potassium: 5.4 mmol/L — ABNORMAL HIGH (ref 3.5–5.1)
Sodium: 132 mmol/L — ABNORMAL LOW (ref 135–145)

## 2022-09-06 LAB — CBG MONITORING, ED: Glucose-Capillary: 136 mg/dL — ABNORMAL HIGH (ref 70–99)

## 2022-09-06 LAB — BETA-HYDROXYBUTYRIC ACID: Beta-Hydroxybutyric Acid: 0.34 mmol/L — ABNORMAL HIGH (ref 0.05–0.27)

## 2022-09-06 LAB — LIPASE, BLOOD: Lipase: 33 U/L (ref 11–51)

## 2022-09-06 MED ORDER — DEXTROSE 50 % IV SOLN
1.0000 | Freq: Once | INTRAVENOUS | Status: AC
  Administered 2022-09-06: 50 mL via INTRAVENOUS
  Filled 2022-09-06: qty 50

## 2022-09-06 MED ORDER — METHOCARBAMOL 500 MG PO TABS
500.0000 mg | ORAL_TABLET | Freq: Once | ORAL | Status: AC
  Administered 2022-09-06: 500 mg via ORAL
  Filled 2022-09-06: qty 1

## 2022-09-06 MED ORDER — INSULIN ASPART 100 UNIT/ML IV SOLN
5.0000 [IU] | Freq: Once | INTRAVENOUS | Status: AC
  Administered 2022-09-06: 5 [IU] via INTRAVENOUS

## 2022-09-06 MED ORDER — ONDANSETRON 4 MG PO TBDP
4.0000 mg | ORAL_TABLET | Freq: Once | ORAL | Status: AC
  Administered 2022-09-06: 4 mg via ORAL
  Filled 2022-09-06: qty 1

## 2022-09-06 MED ORDER — SODIUM ZIRCONIUM CYCLOSILICATE 10 G PO PACK
10.0000 g | PACK | Freq: Once | ORAL | Status: AC
  Administered 2022-09-06: 10 g via ORAL
  Filled 2022-09-06: qty 1

## 2022-09-06 MED ORDER — ALBUTEROL SULFATE (2.5 MG/3ML) 0.083% IN NEBU
10.0000 mg | INHALATION_SOLUTION | Freq: Once | RESPIRATORY_TRACT | Status: AC
  Administered 2022-09-06: 10 mg via RESPIRATORY_TRACT
  Filled 2022-09-06: qty 12

## 2022-09-06 MED ORDER — ACETAMINOPHEN 325 MG PO TABS
650.0000 mg | ORAL_TABLET | Freq: Once | ORAL | Status: AC
  Administered 2022-09-06: 650 mg via ORAL
  Filled 2022-09-06: qty 2

## 2022-09-06 MED ORDER — METHOCARBAMOL 500 MG PO TABS: 500.0000 mg | ORAL_TABLET | Freq: Two times a day (BID) | ORAL | 0 refills | Status: DC | PRN

## 2022-09-06 NOTE — Discharge Instructions (Signed)
Your potassium level was high and your blood work showed signs of dehydration.  It is very important that you complete your peritoneal dialysis tonight and on every scheduled night.  Please call your kidney doctor tomorrow to arrange for follow-up appointment.  We gave you medicines to help lower your potassium in the ER today, but these levels need to be rechecked soon as possible by one of your doctors' offices.  You should also drink the full amount of fluid that is permitted to you every day, even if this is 32 ounces.
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
## 2022-09-11 ENCOUNTER — Inpatient Hospital Stay (HOSPITAL_COMMUNITY)
Admission: EM | Admit: 2022-09-11 | Discharge: 2022-09-16 | DRG: 291 | Disposition: A | Discharge status: Home / Self Care | Payer: Medicare Other | Attending: Internal Medicine | Admitting: Internal Medicine

## 2022-09-11 ENCOUNTER — Encounter (HOSPITAL_COMMUNITY): Payer: Self-pay | Admitting: Emergency Medicine

## 2022-09-11 ENCOUNTER — Other Ambulatory Visit: Payer: Self-pay

## 2022-09-11 ENCOUNTER — Emergency Department (HOSPITAL_COMMUNITY): Payer: Medicare Other

## 2022-09-11 DIAGNOSIS — R944 Abnormal results of kidney function studies: Secondary | ICD-10-CM | POA: Diagnosis present

## 2022-09-11 DIAGNOSIS — I132 Hypertensive heart and chronic kidney disease with heart failure and with stage 5 chronic kidney disease, or end stage renal disease: Principal | ICD-10-CM | POA: Diagnosis present

## 2022-09-11 DIAGNOSIS — G43909 Migraine, unspecified, not intractable, without status migrainosus: Secondary | ICD-10-CM | POA: Diagnosis present

## 2022-09-11 DIAGNOSIS — E8721 Acute metabolic acidosis: Secondary | ICD-10-CM | POA: Diagnosis present

## 2022-09-11 DIAGNOSIS — Z1152 Encounter for screening for COVID-19: Secondary | ICD-10-CM

## 2022-09-11 DIAGNOSIS — Z91199 Patient's noncompliance with other medical treatment and regimen due to unspecified reason: Secondary | ICD-10-CM

## 2022-09-11 DIAGNOSIS — E1010 Type 1 diabetes mellitus with ketoacidosis without coma: Secondary | ICD-10-CM

## 2022-09-11 DIAGNOSIS — E8729 Other acidosis: Secondary | ICD-10-CM | POA: Diagnosis present

## 2022-09-11 DIAGNOSIS — I081 Rheumatic disorders of both mitral and tricuspid valves: Secondary | ICD-10-CM | POA: Diagnosis present

## 2022-09-11 DIAGNOSIS — R1115 Cyclical vomiting syndrome unrelated to migraine: Secondary | ICD-10-CM | POA: Diagnosis present

## 2022-09-11 DIAGNOSIS — I5082 Biventricular heart failure: Secondary | ICD-10-CM | POA: Diagnosis present

## 2022-09-11 DIAGNOSIS — F3181 Bipolar II disorder: Secondary | ICD-10-CM | POA: Diagnosis present

## 2022-09-11 DIAGNOSIS — E1065 Type 1 diabetes mellitus with hyperglycemia: Secondary | ICD-10-CM

## 2022-09-11 DIAGNOSIS — I5023 Acute on chronic systolic (congestive) heart failure: Secondary | ICD-10-CM | POA: Diagnosis present

## 2022-09-11 DIAGNOSIS — Z87891 Personal history of nicotine dependence: Secondary | ICD-10-CM

## 2022-09-11 DIAGNOSIS — E1022 Type 1 diabetes mellitus with diabetic chronic kidney disease: Secondary | ICD-10-CM | POA: Diagnosis present

## 2022-09-11 DIAGNOSIS — E782 Mixed hyperlipidemia: Secondary | ICD-10-CM | POA: Diagnosis present

## 2022-09-11 DIAGNOSIS — Z91148 Patient's other noncompliance with medication regimen for other reason: Secondary | ICD-10-CM

## 2022-09-11 DIAGNOSIS — D631 Anemia in chronic kidney disease: Secondary | ICD-10-CM | POA: Diagnosis present

## 2022-09-11 DIAGNOSIS — E1043 Type 1 diabetes mellitus with diabetic autonomic (poly)neuropathy: Secondary | ICD-10-CM | POA: Diagnosis present

## 2022-09-11 DIAGNOSIS — E8889 Other specified metabolic disorders: Secondary | ICD-10-CM | POA: Diagnosis present

## 2022-09-11 DIAGNOSIS — E875 Hyperkalemia: Secondary | ICD-10-CM

## 2022-09-11 DIAGNOSIS — Z8619 Personal history of other infectious and parasitic diseases: Secondary | ICD-10-CM

## 2022-09-11 DIAGNOSIS — E10649 Type 1 diabetes mellitus with hypoglycemia without coma: Secondary | ICD-10-CM | POA: Diagnosis present

## 2022-09-11 DIAGNOSIS — R9431 Abnormal electrocardiogram [ECG] [EKG]: Secondary | ICD-10-CM | POA: Diagnosis present

## 2022-09-11 DIAGNOSIS — D649 Anemia, unspecified: Secondary | ICD-10-CM | POA: Diagnosis present

## 2022-09-11 DIAGNOSIS — K219 Gastro-esophageal reflux disease without esophagitis: Secondary | ICD-10-CM | POA: Diagnosis present

## 2022-09-11 DIAGNOSIS — R112 Nausea with vomiting, unspecified: Secondary | ICD-10-CM | POA: Diagnosis not present

## 2022-09-11 DIAGNOSIS — I10 Essential (primary) hypertension: Secondary | ICD-10-CM | POA: Diagnosis present

## 2022-09-11 DIAGNOSIS — I3139 Other pericardial effusion (noninflammatory): Secondary | ICD-10-CM | POA: Diagnosis present

## 2022-09-11 DIAGNOSIS — E785 Hyperlipidemia, unspecified: Secondary | ICD-10-CM | POA: Diagnosis present

## 2022-09-11 DIAGNOSIS — Z91018 Allergy to other foods: Secondary | ICD-10-CM

## 2022-09-11 DIAGNOSIS — I272 Pulmonary hypertension, unspecified: Secondary | ICD-10-CM | POA: Diagnosis present

## 2022-09-11 DIAGNOSIS — I428 Other cardiomyopathies: Secondary | ICD-10-CM | POA: Diagnosis present

## 2022-09-11 DIAGNOSIS — N2581 Secondary hyperparathyroidism of renal origin: Secondary | ICD-10-CM | POA: Diagnosis present

## 2022-09-11 DIAGNOSIS — E1069 Type 1 diabetes mellitus with other specified complication: Secondary | ICD-10-CM | POA: Diagnosis present

## 2022-09-11 DIAGNOSIS — N186 End stage renal disease: Secondary | ICD-10-CM | POA: Diagnosis present

## 2022-09-11 DIAGNOSIS — Z79899 Other long term (current) drug therapy: Secondary | ICD-10-CM

## 2022-09-11 DIAGNOSIS — Z992 Dependence on renal dialysis: Secondary | ICD-10-CM

## 2022-09-11 DIAGNOSIS — F419 Anxiety disorder, unspecified: Secondary | ICD-10-CM | POA: Diagnosis present

## 2022-09-11 DIAGNOSIS — Z794 Long term (current) use of insulin: Secondary | ICD-10-CM

## 2022-09-11 DIAGNOSIS — Z9641 Presence of insulin pump (external) (internal): Secondary | ICD-10-CM | POA: Diagnosis present

## 2022-09-11 DIAGNOSIS — I2510 Atherosclerotic heart disease of native coronary artery without angina pectoris: Secondary | ICD-10-CM | POA: Diagnosis present

## 2022-09-11 DIAGNOSIS — Z886 Allergy status to analgesic agent status: Secondary | ICD-10-CM

## 2022-09-11 DIAGNOSIS — K3184 Gastroparesis: Secondary | ICD-10-CM | POA: Diagnosis present

## 2022-09-11 LAB — COMPREHENSIVE METABOLIC PANEL
ALT: 32 U/L (ref 0–44)
AST: 40 U/L (ref 15–41)
Albumin: 3.1 g/dL — ABNORMAL LOW (ref 3.5–5.0)
Alkaline Phosphatase: 95 U/L (ref 38–126)
Anion gap: 19 — ABNORMAL HIGH (ref 5–15)
BUN: 106 mg/dL — ABNORMAL HIGH (ref 6–20)
CO2: 18 mmol/L — ABNORMAL LOW (ref 22–32)
Calcium: 7.3 mg/dL — ABNORMAL LOW (ref 8.9–10.3)
Chloride: 99 mmol/L (ref 98–111)
Creatinine, Ser: 13.04 mg/dL — ABNORMAL HIGH (ref 0.44–1.00)
GFR, Estimated: 4 mL/min — ABNORMAL LOW (ref >60)
Glucose, Bld: 67 mg/dL — ABNORMAL LOW (ref 70–99)
Potassium: 5.9 mmol/L — ABNORMAL HIGH (ref 3.5–5.1)
Sodium: 136 mmol/L (ref 135–145)
Total Bilirubin: 0.9 mg/dL (ref 0.3–1.2)
Total Protein: 6.3 g/dL — ABNORMAL LOW (ref 6.5–8.1)

## 2022-09-11 LAB — CBC
HCT: 33.8 % — ABNORMAL LOW (ref 36.0–46.0)
Hemoglobin: 10.6 g/dL — ABNORMAL LOW (ref 12.0–15.0)
MCH: 31.5 pg (ref 26.0–34.0)
MCHC: 31.4 g/dL (ref 30.0–36.0)
MCV: 100.6 fL — ABNORMAL HIGH (ref 80.0–100.0)
Platelets: 266 10*3/uL (ref 150–400)
RBC: 3.36 MIL/uL — ABNORMAL LOW (ref 3.87–5.11)
RDW: 15.7 % — ABNORMAL HIGH (ref 11.5–15.5)
WBC: 6.2 10*3/uL (ref 4.0–10.5)
nRBC: 0 % (ref 0.0–0.2)

## 2022-09-11 LAB — CBG MONITORING, ED
Glucose-Capillary: 51 mg/dL — ABNORMAL LOW (ref 70–99)
Glucose-Capillary: 66 mg/dL — ABNORMAL LOW (ref 70–99)
Glucose-Capillary: 192 mg/dL — ABNORMAL HIGH (ref 70–99)

## 2022-09-11 LAB — BRAIN NATRIURETIC PEPTIDE: B Natriuretic Peptide: 2413.8 pg/mL — ABNORMAL HIGH (ref 0.0–100.0)

## 2022-09-11 LAB — I-STAT BETA HCG BLOOD, ED (MC, WL, AP ONLY): I-stat hCG, quantitative: <5 m[IU]/mL (ref <5)

## 2022-09-11 LAB — TROPONIN I (HIGH SENSITIVITY): Troponin I (High Sensitivity): 33 ng/L — ABNORMAL HIGH (ref <18)

## 2022-09-11 MED ORDER — DEXTROSE 50 % IV SOLN
50.0000 mL | Freq: Once | INTRAVENOUS | Status: AC
  Administered 2022-09-11: 50 mL via INTRAVENOUS

## 2022-09-11 MED ORDER — LORAZEPAM 2 MG/ML IJ SOLN
0.5000 mg | Freq: Once | INTRAMUSCULAR | Status: AC
  Administered 2022-09-11: 0.5 mg via INTRAVENOUS
  Filled 2022-09-11: qty 1

## 2022-09-11 MED ORDER — SODIUM BICARBONATE 8.4 % IV SOLN
50.0000 meq | Freq: Once | INTRAVENOUS | Status: AC
  Administered 2022-09-12: 50 meq via INTRAVENOUS
  Filled 2022-09-11: qty 50

## 2022-09-11 MED ORDER — DEXTROSE 50 % IV SOLN
INTRAVENOUS | Status: AC
  Filled 2022-09-11: qty 50

## 2022-09-11 MED ORDER — SODIUM CHLORIDE 0.9 % IV BOLUS
250.0000 mL | Freq: Once | INTRAVENOUS | Status: AC
  Administered 2022-09-12: 250 mL via INTRAVENOUS

## 2022-09-11 MED ORDER — CALCIUM GLUCONATE 10 % IV SOLN
1.0000 g | Freq: Once | INTRAVENOUS | Status: AC
  Administered 2022-09-12: 1 g via INTRAVENOUS
  Filled 2022-09-11: qty 10

## 2022-09-11 NOTE — ED Provider Notes (Signed)
[facility] EMERGENCY DEPARTMENT Provider Note   CSN: [id number] Arrival date & time: 09/11/22  2139     History {Add pertinent medical, surgical, social history, OB history to HPI:1} Chief Complaint  Patient presents with   Dizziness    Kathy Lewis is a 27 y.o. female.  HPI   Patient with medical history including type 1 diabetes, end-stage renal disease on peritoneal dialysis, hypertension, nonischemic cardiomyopathy with an EF of 20% presents to the ED with complaints of dizziness nausea and vomiting.  Patient is a difficult historian but states that she is having some slight chest pain and abdominal pain, states it started today, states that the chest pain is intermittent, only comes on when she is about to vomit, she states she has chronic stomach pains, she denies any coffee-ground emesis or bloody emesis, she states she is still passing gas and having normal bowel movements, she denies any bloody stools or melena, she does not make very much urine.  She states that she has been compliant with her peritoneal dialysis.  She denies any fevers chills cough congestion general body aches.  She states that this feels like her normal when she has nausea and vomiting.  She also notes some dizziness, she states this is typical for her when she has nausea and vomiting, she has no associated headaches, change in vision paresthesias or weakness the upper lower extremities, there is been no recent head trauma.  I reviewed patient's chart she has been seen in the past for similar presentation, she has had CT imaging performed 1 month ago which was negative for acute findings, she had an endoscopy performed last year which was negative for acute findings.  Home Medications Prior to Admission medications   Medication Sig Start Date End Date Taking? Authorizing Provider  acetaminophen (TYLENOL) 500 MG tablet Take 1,000 mg by mouth every 6 (six) hours as needed for headache (pain).     [provider]  amitriptyline (ELAVIL) 50 MG tablet Take 1 tablet (50 mg total) by mouth at bedtime. 08/30/22   Salley Slaughter, NP  atorvastatin (LIPITOR) 40 MG tablet Take 40 mg by mouth daily.    [provider]  butalbital-acetaminophen-caffeine (FIORICET) 50-325-40 MG tablet Take 1 tablet by mouth daily as needed for headache.    [provider]  calcitRIOL (ROCALTROL) 0.25 MCG capsule Take 0.25 mcg by mouth every morning. 08/03/21   [provider]  calcium carbonate (TUMS - DOSED IN MG ELEMENTAL CALCIUM) 500 MG chewable tablet Take 2 tablets by mouth 3 (three) times daily with meals.    [provider]  carvedilol (COREG) 12.5 MG tablet Take 12.5 mg by mouth 2 (two) times daily with a meal.    [provider]  chlorpheniramine-HYDROcodone (TUSSIONEX) 10-8 MG/5ML Take 5 mLs by mouth every 12 (twelve) hours as needed for cough. 07/06/22   Deno Etienne, DO  clonazePAM (KLONOPIN) 0.5 MG tablet Take 1 tablet (0.5 mg total) by mouth 2 (two) times daily as needed for anxiety. 03/16/22   [name], MD  hydrALAZINE (APRESOLINE) 100 MG tablet Take 100 mg by mouth 3 (three) times daily. 08/05/21   [provider]  hydrOXYzine (ATARAX) 10 MG tablet Take 1 tablet (10 mg total) by mouth 3 (three) times daily as needed. 08/30/22   Eulis Canner E, NP  insulin lispro (HUMALOG) 100 UNIT/ML injection Inject 0.5 Units into the skin See admin instructions. Via Insulin Pump - 1/2 unit per hour 01/08/22  [provider]  isosorbide mononitrate (IMDUR) 30 MG 24 hr tablet Take 30 mg by mouth in the morning and at bedtime.    [provider]  LORazepam (ATIVAN) 1 MG tablet Take 0.5 tablets (0.5 mg total) by mouth 3 (three) times daily as needed. 08/11/22   Mesner, Corene Cornea, MD  magnesium oxide (MAG-OX) 400 MG tablet Take 400 mg by mouth 2 (two) times daily. 08/03/21   [provider]  methocarbamol (ROBAXIN) 500 MG tablet  Take 500 mg by mouth every 8 (eight) hours as needed for muscle spasms.    [provider]  methocarbamol (ROBAXIN) 500 MG tablet Take 1 tablet (500 mg total) by mouth 2 (two) times daily as needed for up to 15 doses for muscle spasms. 09/06/22   Wyvonnia Dusky, MD  ondansetron (ZOFRAN) 4 MG tablet Take 1 tablet (4 mg total) by mouth every 6 (six) hours. Patient taking differently: Take 4 mg by mouth every 6 (six) hours as needed for nausea or vomiting. 04/11/22   [name], PA  pantoprazole (PROTONIX) 40 MG tablet Take 1 tablet (40 mg total) by mouth 2 (two) times daily before a meal. 08/11/22   Mesner, Corene Cornea, MD  Vitamin D, Ergocalciferol, (DRISDOL) 1.25 MG (50000 UNIT) CAPS capsule Take 50,000 Units by mouth every Sunday.    [provider]  escitalopram (LEXAPRO) 10 MG tablet Take 20 mg by mouth daily.  05/10/20 10/06/20  [provider]  furosemide (LASIX) 40 MG tablet Take 1 tablet (40 mg total) by mouth daily. Patient not taking: Reported on 02/16/2021 10/28/19 02/17/21  [name], MD  lisinopril (ZESTRIL) 10 MG tablet Take 10 mg by mouth daily.  02/23/20 10/06/20  [provider]  spironolactone (ALDACTONE) 25 MG tablet Take 25 mg by mouth daily.  04/20/20 10/06/20  [provider]      Allergies    Cantaloupe extract allergy skin test, Strawberry extract, Citrullus vulgaris, and Nsaids    Review of Systems   Review of Systems  Constitutional:  Negative for chills and fever.  Respiratory:  Negative for shortness of breath.   Cardiovascular:  Positive for chest pain.  Gastrointestinal:  Positive for abdominal pain, nausea and vomiting.  Neurological:  Negative for headaches.    Physical Exam Updated Vital Signs BP (!) 131/103   Pulse (!) 109   Temp 98 F (36.7 C)   Resp (!) 23   Ht 5' (1.524 m)   Wt 54.4 kg   SpO2 100%   BMI 23.44 kg/m  Physical Exam Vitals and nursing note reviewed.  Constitutional:      General: She  is not in acute distress.    Appearance: She is not ill-appearing.  HENT:     Head: Normocephalic and atraumatic.     Nose: No congestion.  Eyes:     Conjunctiva/sclera: Conjunctivae normal.  Cardiovascular:     Rate and Rhythm: Regular rhythm. Tachycardia present.     Pulses: Normal pulses.     Heart sounds: No murmur heard.    No friction rub. No gallop.  Pulmonary:     Effort: No respiratory distress.     Breath sounds: No wheezing, rhonchi or rales.  Abdominal:     Palpations: Abdomen is soft.     Tenderness: There is abdominal tenderness. There is no right CVA tenderness or left CVA tenderness.     Comments: Abdomen slightly distended soft, dull to percussion, peritoneal catheter present without evidence of infection, patient  some slight tenderness in the epigastric region there is no guarding or rebound tenderness, no peritoneal sign negative Murphy sign McBurney point.  Musculoskeletal:     Right lower leg: No edema.     Left lower leg: No edema.     Comments: No evidence of peripheral edema, no unilateral leg swelling, no calf tenderness no palpable cords.  Skin:    General: Skin is warm and dry.  Neurological:     Mental Status: She is alert.     GCS: GCS eye subscore is 4. GCS verbal subscore is 5. GCS motor subscore is 6.     Cranial Nerves: Cranial nerves 2-12 are intact.     Sensory: Sensation is intact.     Motor: No weakness.     Coordination: Romberg sign negative. Finger-Nose-Finger Test normal.     Comments: Cranial nerves II through XII grossly intact no difficulty with word finding, following two-step commands, there is no unilateral weakness present.  Psychiatric:        Mood and Affect: Mood normal.     ED Results / Procedures / Treatments   Labs (all labs ordered are listed, but only abnormal results are displayed) Labs Reviewed  CBC - Abnormal; Notable for the following components:      Result Value   RBC 3.36 (*)    Hemoglobin 10.6 (*)    HCT  33.8 (*)    MCV 100.6 (*)    RDW 15.7 (*)    All other components within normal limits  COMPREHENSIVE METABOLIC PANEL - Abnormal; Notable for the following components:   Potassium 5.9 (*)    CO2 18 (*)    Glucose, Bld 67 (*)    BUN 106 (*)    Creatinine, Ser 13.04 (*)    Calcium 7.3 (*)    Total Protein 6.3 (*)    Albumin 3.1 (*)    GFR, Estimated 4 (*)    Anion gap 19 (*)    All other components within normal limits  CBG MONITORING, ED - Abnormal; Notable for the following components:   Glucose-Capillary 66 (*)    All other components within normal limits  CBG MONITORING, ED - Abnormal; Notable for the following components:   Glucose-Capillary 51 (*)    All other components within normal limits  CBG MONITORING, ED - Abnormal; Notable for the following components:   Glucose-Capillary 192 (*)    All other components within normal limits  TROPONIN I (HIGH SENSITIVITY) - Abnormal; Notable for the following components:   Troponin I (High Sensitivity) 33 (*)    All other components within normal limits  RESP PANEL BY RT-PCR (RSV, FLU A&B, COVID)  RVPGX2  BRAIN NATRIURETIC PEPTIDE  MAGNESIUM  I-STAT BETA HCG BLOOD, ED ([facility], [facility], [facility] ONLY)    EKG EKG Interpretation  Date/Time:  Monday September 11 2022 22:19:59 EST Ventricular Rate:  109 PR Interval:  154 QRS Duration: 94 QT Interval:  382 QTC Calculation: 514 R Axis:   118 Text Interpretation: Sinus tachycardia Biatrial enlargement Right axis deviation Pulmonary disease pattern Nonspecific ST abnormality Abnormal ECG Confirmed by Ripley Fraise [id number]) on 09/11/2022 11:09:18 PM  Radiology DG Chest 2 View  Result Date: 09/11/2022 CLINICAL DATA:  Chest pain, dizziness EXAM: CHEST - 2 VIEW COMPARISON:  08/24/2022 FINDINGS: Frontal and lateral views of the chest demonstrates stable enlargement of the cardiac silhouette. No airspace disease, effusion, or pneumothorax. No acute bony abnormality. IMPRESSION: 1. Stable enlarged cardiac  silhouette. 2. No acute  airspace disease. Electronically Signed   By: Randa Ngo M.D.   On: 09/11/2022 22:12    Procedures Procedures  {Document cardiac monitor, telemetry assessment procedure when appropriate:1}  Medications Ordered in ED Medications  calcium gluconate inj 10% (1 g) URGENT USE ONLY! (has no administration in time range)  sodium bicarbonate injection 50 mEq (has no administration in time range)  dextrose 50 % solution 50 mL (0 mLs Intravenous Hold 09/11/22 2229)  LORazepam (ATIVAN) injection 0.5 mg (0.5 mg Intravenous Given 09/11/22 2308)    ED Course/ Medical Decision Making/ A&P   {   Click here for ABCD2, HEART and other calculatorsREFRESH Note before signing :1}                          Medical Decision Making Amount and/or Complexity of Data Reviewed Labs: ordered. Radiology: ordered.  Risk Prescription drug management.   This patient presents to the ED for concern of nausea vomiting, this involves an extensive number of treatment options, and is a complaint that carries with it a high risk of complications and morbidity.  The differential diagnosis includes bowel obstruction, volvulus, perforated stomach ulcer, DKA,    Additional history obtained:  Additional history obtained from EMS External records from outside source obtained and reviewed including recent ER notes, cardiology notes   Co morbidities that complicate the patient evaluation  End-stage renal disease, cardiomyopathy  Social Determinants of Health:  N/A    Lab Tests:  I Ordered, and personally interpreted labs.  The pertinent results include: CBC shows macrocytic anemia with a stable hemoglobin of 10.6, CMP shows hyperkalemia of 5.9, CO2 of 18, glucose 67, BUN of 106, creatinine 13, calcium 7.3, anion gap 19, repeat CBG is 192 i-STAT hCG less than 5   Imaging Studies ordered:  I ordered imaging studies including chest x-ray, DG of abdomen I independently visualized and  interpreted imaging which showed chest x-ray negative for acute findings I agree with the radiologist interpretation   Cardiac Monitoring:  The patient was maintained on a cardiac monitor.  I personally viewed and interpreted the cardiac monitored which showed an underlying rhythm of: EKG without signs of ischemia   Medicines ordered and prescription drug management:  I ordered medication including bicarb, calcium, Ativan I have reviewed the patients home medicines and have made adjustments as needed  Critical Interventions:  Hyperkalemia-no EKG changes, patient will be given Lokelma, bicarb, calcium gluconate   Reevaluation:  Presents with abdominal pain nausea and vomiting, triage noted that patient's glucose was low, was given an amp of D50, on my evaluation patient was a difficult historian, she is notably tachycardic, she appears to be chronically ill, will provide her with Ativan due to her history of prolonged QT, and continue to monitor will hold off on fluids due to her low EF.    Consultations Obtained:  I requested consultation with the ***,  and discussed lab and imaging findings as well as pertinent plan - they recommend: ***    Test Considered:  ***    Rule out ****    Dispostion and problem list  After consideration of the diagnostic results and the patients response to treatment, I feel that the patent would benefit from ***.       {Document critical care time when appropriate:1} {Document review of labs and clinical decision tools ie heart score, Chads2Vasc2 etc:1}  {Document your independent review of radiology images, and any outside records:1} {Document your  discussion with family members, caretakers, and with consultants:1} {Document social determinants of health affecting pt's care:1} {Document your decision making why or why not admission, treatments were needed:1} Final Clinical Impression(s) / ED Diagnoses Final diagnoses:  None     Rx / DC Orders ED Discharge Orders     None

## 2022-09-11 NOTE — ED Provider Triage Note (Signed)
Emergency Medicine Provider Triage Evaluation Note  Kathy Lewis , a 27 y.o. female  was evaluated in triage.  Pt complains of dizziness, low blood sugar, decreased p.o. intake, chest pain, lightheadedness, nausea, vomiting.  Recent history of hyperkalemia.  Patient does peritoneal dialysis at home, last session this morning, had a lot of fluid taken off.  Patient additionally tearful in triage, reporting she does not feel like she is being taken seriously by the [facility] transplant team and wants to be transferred to the [facility] transplant service.  Patient's ESRD secondary to uncontrolled diabetes.  Review of Systems  Positive: Dizziness, low blood sugar, decreased appetite, chest pain Negative: Fever, chills  Physical Exam  BP (!) 140/116 (BP Location: Right Arm)   Pulse (!) 108   Temp 98 F (36.7 C)   Resp 16   SpO2 99%  Gen:   Awake, tearful Resp:  Normal effort  MSK:   Moves extremities without difficulty  Other:  No significant ttp of abdomen on my exam  Medical Decision Making  Medically screening exam initiated at 9:58 PM.  Appropriate orders placed.  Kathy Lewis was informed that the remainder of the evaluation will be completed by another provider, this initial triage assessment does not replace that evaluation, and the importance of remaining in the ED until their evaluation is complete.  Workup initiated in triage  Blood glucose 66 in triage, patient provided with juice   Anselmo Pickler, PA-C 09/11/22 2158

## 2022-09-11 NOTE — ED Triage Notes (Signed)
Pt bib gcems from home for dizziness and low blood sugar. Decreased PO intake today. Does peritoneal dialysis at home last session this morning (1394ml taken off), hx of hyperkalemia.   BP 150/111, HR 114, Spo2 98% RA

## 2022-09-12 DIAGNOSIS — N186 End stage renal disease: Secondary | ICD-10-CM

## 2022-09-12 DIAGNOSIS — E875 Hyperkalemia: Secondary | ICD-10-CM

## 2022-09-12 DIAGNOSIS — F3181 Bipolar II disorder: Secondary | ICD-10-CM

## 2022-09-12 DIAGNOSIS — I5023 Acute on chronic systolic (congestive) heart failure: Secondary | ICD-10-CM | POA: Insufficient documentation

## 2022-09-12 DIAGNOSIS — K3184 Gastroparesis: Secondary | ICD-10-CM

## 2022-09-12 DIAGNOSIS — E1065 Type 1 diabetes mellitus with hyperglycemia: Secondary | ICD-10-CM | POA: Diagnosis not present

## 2022-09-12 DIAGNOSIS — E8721 Acute metabolic acidosis: Secondary | ICD-10-CM | POA: Diagnosis present

## 2022-09-12 DIAGNOSIS — I081 Rheumatic disorders of both mitral and tricuspid valves: Secondary | ICD-10-CM | POA: Diagnosis present

## 2022-09-12 DIAGNOSIS — Z992 Dependence on renal dialysis: Secondary | ICD-10-CM | POA: Diagnosis not present

## 2022-09-12 DIAGNOSIS — Z1152 Encounter for screening for COVID-19: Secondary | ICD-10-CM | POA: Diagnosis not present

## 2022-09-12 DIAGNOSIS — R112 Nausea with vomiting, unspecified: Secondary | ICD-10-CM | POA: Diagnosis present

## 2022-09-12 DIAGNOSIS — I5022 Chronic systolic (congestive) heart failure: Secondary | ICD-10-CM

## 2022-09-12 DIAGNOSIS — Z794 Long term (current) use of insulin: Secondary | ICD-10-CM | POA: Diagnosis not present

## 2022-09-12 DIAGNOSIS — I10 Essential (primary) hypertension: Secondary | ICD-10-CM

## 2022-09-12 DIAGNOSIS — R1115 Cyclical vomiting syndrome unrelated to migraine: Secondary | ICD-10-CM | POA: Diagnosis not present

## 2022-09-12 DIAGNOSIS — N2581 Secondary hyperparathyroidism of renal origin: Secondary | ICD-10-CM | POA: Diagnosis present

## 2022-09-12 DIAGNOSIS — E1022 Type 1 diabetes mellitus with diabetic chronic kidney disease: Secondary | ICD-10-CM | POA: Diagnosis present

## 2022-09-12 DIAGNOSIS — E782 Mixed hyperlipidemia: Secondary | ICD-10-CM | POA: Diagnosis present

## 2022-09-12 DIAGNOSIS — D631 Anemia in chronic kidney disease: Secondary | ICD-10-CM | POA: Diagnosis present

## 2022-09-12 DIAGNOSIS — I272 Pulmonary hypertension, unspecified: Secondary | ICD-10-CM | POA: Diagnosis present

## 2022-09-12 DIAGNOSIS — E10649 Type 1 diabetes mellitus with hypoglycemia without coma: Secondary | ICD-10-CM | POA: Diagnosis present

## 2022-09-12 DIAGNOSIS — E1043 Type 1 diabetes mellitus with diabetic autonomic (poly)neuropathy: Secondary | ICD-10-CM | POA: Diagnosis present

## 2022-09-12 DIAGNOSIS — I132 Hypertensive heart and chronic kidney disease with heart failure and with stage 5 chronic kidney disease, or end stage renal disease: Secondary | ICD-10-CM | POA: Diagnosis present

## 2022-09-12 DIAGNOSIS — I5082 Biventricular heart failure: Secondary | ICD-10-CM | POA: Diagnosis present

## 2022-09-12 DIAGNOSIS — I5021 Acute systolic (congestive) heart failure: Secondary | ICD-10-CM | POA: Insufficient documentation

## 2022-09-12 DIAGNOSIS — I428 Other cardiomyopathies: Secondary | ICD-10-CM | POA: Diagnosis present

## 2022-09-12 DIAGNOSIS — E8889 Other specified metabolic disorders: Secondary | ICD-10-CM | POA: Diagnosis present

## 2022-09-12 DIAGNOSIS — R9431 Abnormal electrocardiogram [ECG] [EKG]: Secondary | ICD-10-CM

## 2022-09-12 DIAGNOSIS — I3139 Other pericardial effusion (noninflammatory): Secondary | ICD-10-CM | POA: Diagnosis present

## 2022-09-12 DIAGNOSIS — E1069 Type 1 diabetes mellitus with other specified complication: Secondary | ICD-10-CM | POA: Diagnosis present

## 2022-09-12 LAB — I-STAT VENOUS BLOOD GAS, ED
Acid-base deficit: 6 mmol/L — ABNORMAL HIGH (ref 0.0–2.0)
Bicarbonate: 18.6 mmol/L — ABNORMAL LOW (ref 20.0–28.0)
Calcium, Ion: 0.86 mmol/L — CL (ref 1.15–1.40)
HCT: 29 % — ABNORMAL LOW (ref 36.0–46.0)
Hemoglobin: 9.9 g/dL — ABNORMAL LOW (ref 12.0–15.0)
O2 Saturation: 99 %
Potassium: 5.8 mmol/L — ABNORMAL HIGH (ref 3.5–5.1)
Sodium: 132 mmol/L — ABNORMAL LOW (ref 135–145)
TCO2: 20 mmol/L — ABNORMAL LOW (ref 22–32)
pCO2, Ven: 33.4 mmHg — ABNORMAL LOW (ref 44–60)
pH, Ven: 7.354 (ref 7.25–7.43)
pO2, Ven: 141 mmHg — ABNORMAL HIGH (ref 32–45)

## 2022-09-12 LAB — RESP PANEL BY RT-PCR (RSV, FLU A&B, COVID)  RVPGX2
Influenza A by PCR: NEGATIVE
Influenza B by PCR: NEGATIVE
Resp Syncytial Virus by PCR: NEGATIVE
SARS Coronavirus 2 by RT PCR: NEGATIVE

## 2022-09-12 LAB — BASIC METABOLIC PANEL
Anion gap: 20 — ABNORMAL HIGH (ref 5–15)
Anion gap: 22 — ABNORMAL HIGH (ref 5–15)
Anion gap: 20 — ABNORMAL HIGH (ref 5–15)
BUN: 108 mg/dL — ABNORMAL HIGH (ref 6–20)
BUN: 108 mg/dL — ABNORMAL HIGH (ref 6–20)
BUN: 109 mg/dL — ABNORMAL HIGH (ref 6–20)
CO2: 18 mmol/L — ABNORMAL LOW (ref 22–32)
CO2: 17 mmol/L — ABNORMAL LOW (ref 22–32)
CO2: 16 mmol/L — ABNORMAL LOW (ref 22–32)
Calcium: 7.3 mg/dL — ABNORMAL LOW (ref 8.9–10.3)
Calcium: 7.7 mg/dL — ABNORMAL LOW (ref 8.9–10.3)
Calcium: 7.3 mg/dL — ABNORMAL LOW (ref 8.9–10.3)
Chloride: 98 mmol/L (ref 98–111)
Chloride: 97 mmol/L — ABNORMAL LOW (ref 98–111)
Chloride: 96 mmol/L — ABNORMAL LOW (ref 98–111)
Creatinine, Ser: 12.99 mg/dL — ABNORMAL HIGH (ref 0.44–1.00)
Creatinine, Ser: 12.34 mg/dL — ABNORMAL HIGH (ref 0.44–1.00)
Creatinine, Ser: 13.06 mg/dL — ABNORMAL HIGH (ref 0.44–1.00)
GFR, Estimated: 4 mL/min — ABNORMAL LOW (ref >60)
GFR, Estimated: 4 mL/min — ABNORMAL LOW (ref >60)
GFR, Estimated: 4 mL/min — ABNORMAL LOW (ref >60)
Glucose, Bld: 162 mg/dL — ABNORMAL HIGH (ref 70–99)
Glucose, Bld: 99 mg/dL (ref 70–99)
Glucose, Bld: 148 mg/dL — ABNORMAL HIGH (ref 70–99)
Potassium: 7 mmol/L (ref 3.5–5.1)
Potassium: 5.9 mmol/L — ABNORMAL HIGH (ref 3.5–5.1)
Potassium: 6.1 mmol/L — ABNORMAL HIGH (ref 3.5–5.1)
Sodium: 136 mmol/L (ref 135–145)
Sodium: 136 mmol/L (ref 135–145)
Sodium: 132 mmol/L — ABNORMAL LOW (ref 135–145)

## 2022-09-12 LAB — BLOOD GAS, VENOUS
Acid-base deficit: 7.1 mmol/L — ABNORMAL HIGH (ref 0.0–2.0)
Bicarbonate: 19.3 mmol/L — ABNORMAL LOW (ref 20.0–28.0)
O2 Saturation: 42.4 %
Patient temperature: 37
pCO2, Ven: 41 mmHg — ABNORMAL LOW (ref 44–60)
pH, Ven: 7.28 (ref 7.25–7.43)
pO2, Ven: 33 mmHg (ref 32–45)

## 2022-09-12 LAB — CBG MONITORING, ED
Glucose-Capillary: 113 mg/dL — ABNORMAL HIGH (ref 70–99)
Glucose-Capillary: 126 mg/dL — ABNORMAL HIGH (ref 70–99)
Glucose-Capillary: 132 mg/dL — ABNORMAL HIGH (ref 70–99)
Glucose-Capillary: 133 mg/dL — ABNORMAL HIGH (ref 70–99)
Glucose-Capillary: 166 mg/dL — ABNORMAL HIGH (ref 70–99)
Glucose-Capillary: 169 mg/dL — ABNORMAL HIGH (ref 70–99)
Glucose-Capillary: 34 mg/dL — CL (ref 70–99)
Glucose-Capillary: 94 mg/dL (ref 70–99)

## 2022-09-12 LAB — CBC
HCT: 32.6 % — ABNORMAL LOW (ref 36.0–46.0)
Hemoglobin: 10.7 g/dL — ABNORMAL LOW (ref 12.0–15.0)
MCH: 31.7 pg (ref 26.0–34.0)
MCHC: 32.8 g/dL (ref 30.0–36.0)
MCV: 96.4 fL (ref 80.0–100.0)
Platelets: 255 10*3/uL (ref 150–400)
RBC: 3.38 MIL/uL — ABNORMAL LOW (ref 3.87–5.11)
RDW: 15.7 % — ABNORMAL HIGH (ref 11.5–15.5)
WBC: 6.6 10*3/uL (ref 4.0–10.5)
nRBC: 0 % (ref 0.0–0.2)

## 2022-09-12 LAB — GLUCOSE, CAPILLARY
Glucose-Capillary: 174 mg/dL — ABNORMAL HIGH (ref 70–99)
Glucose-Capillary: 335 mg/dL — ABNORMAL HIGH (ref 70–99)

## 2022-09-12 LAB — MAGNESIUM: Magnesium: 2 mg/dL (ref 1.7–2.4)

## 2022-09-12 LAB — LACTIC ACID, PLASMA
Lactic Acid, Venous: 1.4 mmol/L (ref 0.5–1.9)
Lactic Acid, Venous: 1.4 mmol/L (ref 0.5–1.9)

## 2022-09-12 LAB — TROPONIN I (HIGH SENSITIVITY): Troponin I (High Sensitivity): 33 ng/L — ABNORMAL HIGH (ref <18)

## 2022-09-12 MED ORDER — FUROSEMIDE 10 MG/ML IJ SOLN
40.0000 mg | Freq: Once | INTRAMUSCULAR | Status: AC
  Administered 2022-09-12: 40 mg via INTRAVENOUS
  Filled 2022-09-12: qty 4

## 2022-09-12 MED ORDER — DELFLEX-LC/1.5% DEXTROSE 344 MOSM/L IP SOLN: INTRAPERITONEAL | Status: DC

## 2022-09-12 MED ORDER — INSULIN ASPART 100 UNIT/ML IJ SOLN
0.0000 [IU] | INTRAMUSCULAR | Status: DC
  Administered 2022-09-12: 2 [IU] via SUBCUTANEOUS

## 2022-09-12 MED ORDER — ACETAMINOPHEN 325 MG PO TABS
650.0000 mg | ORAL_TABLET | ORAL | Status: DC | PRN
  Administered 2022-09-12 – 2022-09-15 (×4): 650 mg via ORAL
  Filled 2022-09-12 (×4): qty 2

## 2022-09-12 MED ORDER — CALCIUM GLUCONATE-NACL 1-0.675 GM/50ML-% IV SOLN: 1.0000 g | Freq: Once | INTRAVENOUS | Status: DC

## 2022-09-12 MED ORDER — ISOSORBIDE MONONITRATE ER 30 MG PO TB24
30.0000 mg | ORAL_TABLET | Freq: Every day | ORAL | Status: DC
  Administered 2022-09-12: 30 mg via ORAL
  Filled 2022-09-12: qty 1

## 2022-09-12 MED ORDER — CARVEDILOL 12.5 MG PO TABS
12.5000 mg | ORAL_TABLET | Freq: Two times a day (BID) | ORAL | Status: DC
  Administered 2022-09-12: 12.5 mg via ORAL
  Filled 2022-09-12: qty 1

## 2022-09-12 MED ORDER — HEPARIN SODIUM (PORCINE) 5000 UNIT/ML IJ SOLN
5000.0000 [IU] | Freq: Three times a day (TID) | INTRAMUSCULAR | Status: DC
  Administered 2022-09-12 – 2022-09-16 (×11): 5000 [IU] via SUBCUTANEOUS
  Filled 2022-09-12 (×11): qty 1

## 2022-09-12 MED ORDER — INSULIN ASPART 100 UNIT/ML IJ SOLN
0.0000 [IU] | Freq: Three times a day (TID) | INTRAMUSCULAR | Status: DC
  Administered 2022-09-12: 1 [IU] via SUBCUTANEOUS
  Administered 2022-09-12 – 2022-09-13 (×2): 4 [IU] via SUBCUTANEOUS

## 2022-09-12 MED ORDER — INSULIN ASPART 100 UNIT/ML IJ SOLN: 0.0000 [IU] | Freq: Three times a day (TID) | INTRAMUSCULAR | Status: DC

## 2022-09-12 MED ORDER — DEXTROSE 50 % IV SOLN
INTRAVENOUS | Status: AC
  Administered 2022-09-12: 25 mL via INTRAVENOUS
  Filled 2022-09-12: qty 50

## 2022-09-12 MED ORDER — ATORVASTATIN CALCIUM 40 MG PO TABS
40.0000 mg | ORAL_TABLET | Freq: Every day | ORAL | Status: DC
  Administered 2022-09-12 – 2022-09-15 (×4): 40 mg via ORAL
  Filled 2022-09-12 (×5): qty 1

## 2022-09-12 MED ORDER — METOCLOPRAMIDE HCL 5 MG/ML IJ SOLN
5.0000 mg | Freq: Four times a day (QID) | INTRAMUSCULAR | Status: DC
  Administered 2022-09-12 – 2022-09-13 (×3): 5 mg via INTRAVENOUS
  Filled 2022-09-12 (×3): qty 2

## 2022-09-12 MED ORDER — HEPARIN SODIUM (PORCINE) 1000 UNIT/ML IJ SOLN: 3000.0000 [IU] | Freq: Once | INTRAMUSCULAR | Status: DC

## 2022-09-12 MED ORDER — AMITRIPTYLINE HCL 25 MG PO TABS
50.0000 mg | ORAL_TABLET | Freq: Every day | ORAL | Status: DC
  Administered 2022-09-12 – 2022-09-15 (×4): 50 mg via ORAL
  Filled 2022-09-12: qty 1
  Filled 2022-09-12 (×3): qty 2
  Filled 2022-09-12: qty 1

## 2022-09-12 MED ORDER — SODIUM CHLORIDE 0.9 % IV BOLUS: 500.0000 mL | Freq: Once | INTRAVENOUS | Status: DC

## 2022-09-12 MED ORDER — SODIUM CHLORIDE 0.9 % IV BOLUS
250.0000 mL | Freq: Once | INTRAVENOUS | Status: AC
  Administered 2022-09-12: 250 mL via INTRAVENOUS

## 2022-09-12 MED ORDER — HEPARIN SODIUM (PORCINE) 1000 UNIT/ML IJ SOLN
INTRAPERITONEAL | Status: DC | PRN
  Filled 2022-09-12 (×2): qty 6000

## 2022-09-12 MED ORDER — TRIMETHOBENZAMIDE HCL 100 MG/ML IM SOLN: 200.0000 mg | Freq: Four times a day (QID) | INTRAMUSCULAR | Status: DC | PRN

## 2022-09-12 MED ORDER — GENTAMICIN SULFATE 0.1 % EX CREA
1.0000 | TOPICAL_CREAM | Freq: Every day | CUTANEOUS | Status: DC
  Administered 2022-09-13: 1 via TOPICAL
  Filled 2022-09-12 (×2): qty 15

## 2022-09-12 MED ORDER — METOCLOPRAMIDE HCL 5 MG/ML IJ SOLN
10.0000 mg | Freq: Once | INTRAMUSCULAR | Status: AC
  Administered 2022-09-12: 10 mg via INTRAVENOUS
  Filled 2022-09-12: qty 2

## 2022-09-12 MED ORDER — DELFLEX-LC/1.5% DEXTROSE 344 MOSM/L IP SOLN: Freq: Once | INTRAPERITONEAL | Status: DC

## 2022-09-12 MED ORDER — FUROSEMIDE 10 MG/ML IJ SOLN
80.0000 mg | Freq: Once | INTRAMUSCULAR | Status: AC
  Administered 2022-09-12: 80 mg via INTRAVENOUS
  Filled 2022-09-12: qty 8

## 2022-09-12 MED ORDER — INSULIN DETEMIR 100 UNIT/ML ~~LOC~~ SOLN
5.0000 [IU] | Freq: Every day | SUBCUTANEOUS | Status: DC
  Administered 2022-09-12: 5 [IU] via SUBCUTANEOUS
  Filled 2022-09-12 (×2): qty 0.05

## 2022-09-12 MED ORDER — CALCIUM GLUCONATE 10 % IV SOLN
1.0000 g | Freq: Once | INTRAVENOUS | Status: AC
  Administered 2022-09-12: 1 g via INTRAVENOUS
  Filled 2022-09-12: qty 10

## 2022-09-12 MED ORDER — SODIUM ZIRCONIUM CYCLOSILICATE 10 G PO PACK
10.0000 g | PACK | Freq: Three times a day (TID) | ORAL | Status: DC
  Administered 2022-09-12 – 2022-09-14 (×6): 10 g via ORAL
  Filled 2022-09-12 (×7): qty 1

## 2022-09-12 MED ORDER — DELFLEX-LC/1.5% DEXTROSE 344 MOSM/L IP SOLN
Freq: Once | INTRAPERITONEAL | Status: DC
  Filled 2022-09-12: qty 6000

## 2022-09-12 MED ORDER — SODIUM ZIRCONIUM CYCLOSILICATE 10 G PO PACK
10.0000 g | PACK | Freq: Once | ORAL | Status: AC
  Administered 2022-09-12: 10 g via ORAL
  Filled 2022-09-12: qty 1

## 2022-09-12 MED ORDER — SODIUM BICARBONATE 8.4 % IV SOLN
50.0000 meq | Freq: Once | INTRAVENOUS | Status: AC
  Administered 2022-09-12: 50 meq via INTRAVENOUS
  Filled 2022-09-12: qty 50

## 2022-09-12 MED ORDER — PROCHLORPERAZINE EDISYLATE 10 MG/2ML IJ SOLN
10.0000 mg | Freq: Four times a day (QID) | INTRAMUSCULAR | Status: DC | PRN
  Administered 2022-09-12 – 2022-09-14 (×3): 10 mg via INTRAVENOUS
  Filled 2022-09-12 (×4): qty 2

## 2022-09-12 MED ORDER — HEPARIN SODIUM (PORCINE) 1000 UNIT/ML IJ SOLN
3000.0000 [IU] | Freq: Once | INTRAMUSCULAR | Status: DC
  Filled 2022-09-12: qty 3

## 2022-09-12 MED ORDER — CHLORHEXIDINE GLUCONATE CLOTH 2 % EX PADS
6.0000 | MEDICATED_PAD | Freq: Every day | CUTANEOUS | Status: DC
  Administered 2022-09-12 – 2022-09-15 (×3): 6 via TOPICAL

## 2022-09-12 MED ORDER — DEXTROSE 50 % IV SOLN
50.0000 mL | Freq: Once | INTRAVENOUS | Status: AC
  Administered 2022-09-12: 50 mL via INTRAVENOUS
  Filled 2022-09-12: qty 50

## 2022-09-12 MED ORDER — PANTOPRAZOLE SODIUM 40 MG IV SOLR
40.0000 mg | Freq: Two times a day (BID) | INTRAVENOUS | Status: DC
  Administered 2022-09-12 – 2022-09-14 (×5): 40 mg via INTRAVENOUS
  Filled 2022-09-12 (×6): qty 10

## 2022-09-12 MED ORDER — HYDRALAZINE HCL 50 MG PO TABS
100.0000 mg | ORAL_TABLET | Freq: Three times a day (TID) | ORAL | Status: DC
  Administered 2022-09-12: 100 mg via ORAL
  Filled 2022-09-12: qty 2

## 2022-09-12 MED ORDER — INSULIN ASPART 100 UNIT/ML IJ SOLN
10.0000 [IU] | Freq: Once | INTRAMUSCULAR | Status: AC
  Administered 2022-09-12: 10 [IU] via INTRAVENOUS

## 2022-09-12 MED ORDER — LORAZEPAM 0.5 MG PO TABS: 0.5000 mg | ORAL_TABLET | Freq: Three times a day (TID) | ORAL | Status: DC | PRN

## 2022-09-12 NOTE — ED Notes (Signed)
Provider at bedside to evaluate patient

## 2022-09-12 NOTE — Progress Notes (Signed)
Notified of hypotension that responded to 250 mL bolus of NS.  PCCM and advanced HF team consulted.  Discussed case with PCCM and plan to transfer to ICU so we can start CCPD as quickly as possible and still be able to add pressors or milrinone if needed.

## 2022-09-12 NOTE — ED Notes (Signed)
Pt given [place] sandwich

## 2022-09-12 NOTE — ED Notes (Signed)
Dr. Claria Dice notified of hypoglycemia; pt alert, given juice and 1/2 amp D50 per Dr. Claria Dice

## 2022-09-12 NOTE — Inpatient Diabetes Management (Signed)
Inpatient Diabetes Program Recommendations  AACE/ADA: New Consensus Statement on Inpatient Glycemic Control (2015)  Target Ranges:  Prepandial:   less than 140 mg/dL      Peak postprandial:   less than 180 mg/dL (1-2 hours)      Critically ill patients:  140 - 180 mg/dL   Lab Results  Component Value Date   GLUCAP 113 (H) 09/12/2022   HGBA1C 8.6 (H) 06/27/2022    Latest Reference Range & Units 09/12/22 00:29 09/12/22 04:49 09/12/22 06:10 09/12/22 06:53 09/12/22 07:32 09/12/22 08:55  Glucose-Capillary 70 - 99 mg/dL 166 (H) 133 (H) 34 (LL) 94 132 (H) 113 (H)    Latest Reference Range & Units 09/12/22 02:35  Sodium 135 - 145 mmol/L 136  Potassium 3.5 - 5.1 mmol/L 7.0 (HH)  Chloride 98 - 111 mmol/L 98  CO2 22 - 32 mmol/L 18 (L)  Glucose 70 - 99 mg/dL 162 (H)  BUN 6 - 20 mg/dL 108 (H)  Creatinine 0.44 - 1.00 mg/dL 12.99 (H)  Calcium 8.9 - 10.3 mg/dL 7.3 (L)  Anion gap 5 - 15  20 (H)   Diabetes history: DM type 1 Outpatient Diabetes medications: Omnipod insulin pump with dexcom CGM  -0.5 units/hour (total of 12 units/day), insulin to carb ratio is 1:8 grams carbs, and insulin sensitivity is 1:50 mg/dl (1 unit drops glucose 50 mg/dl).   Current orders for Inpatient glycemic control: Novolog 0-15 units tid  Inpatient Diabetes Program Recommendations:   Noted hypoglycemia post Novolog given for hyperkalemia. Please consider: Decrease Novolog correction to 0-6 units tid, 0-5 units hs -Add Levemir 6 units basal qd Consider restart insulin pump when K+ levels stable.  Thank you, Nani Gasser. Undrea Shipes, RN, MSN, CDE  Diabetes Coordinator Inpatient Glycemic Control Team Team Pager [phone] (8am-5pm) 09/12/2022 9:38 AM

## 2022-09-12 NOTE — Consult Note (Addendum)
NAME:  Kathy Lewis, MRN:  [id number], DOB:  04-Aug-1996, LOS: 0 ADMISSION DATE:  09/11/2022, CONSULTATION DATE:  1/16 REFERRING MD:  Tyrell Antonio, CHIEF COMPLAINT:  shock   History of Present Illness:  This is a 27 year old female patient with significant history as noted below most specifically severe nonischemic cardiomyopathy with EF of 13 to 20%, and end-stage renal disease on peritoneal dialysis.  Recently hospitalized not long ago for severe volume overload and acute on chronic heart failure requiring CRRT for volume removal in addition to dobutamine and later milrinone support.  Ultimately was planned to be discharged and start intermittent hemodialysis however patient left AGAINST MEDICAL ADVICE and has yet to to start this.  Resented to the emergency room on 1/15 with chief complaint of dizziness, hypoglycemia, poor p.o. intake, and intermittent nausea and vomiting x 24 hours.  In the ER initially found to be normotensive with systolic blood pressure in the 140s to 160s, had potassium of 5.9, and metabolic acidosis.  Repeat potassium was 7.  Was to admitted for urgent nephrology consultation , Per kalemia treated with [facility], and patient was set for peritoneal dialysis as they awaited medical bed. She was seen by nephrology with plans to initiate peritoneal dialysis while in the emergency room given the extensive hospital weight for dialysis.  Her home medications had been resumed these included hydralazine and Coreg.  Over the course of her time in the emergency room she became progressively hypotensive with systolic blood pressure as low as the 60s.  Thus the reason for critical care consultation.  Current diagnostic studies: Normal white blood cell count, hemoglobin 10.7, capillary blood glucose 169, potassium 7, sodium 136 [place] positive metabolic acidosis.  Chest x-ray from day of presentation on 1/15 showed cardiomegaly without significant pulmonary edema Pertinent  Medical History  NICM EF  13-20%, ESRD on PD, h/o cyclic nausea and vomiting with gastroparesis bipolar disease prolonged Qt, dyslipidemia, type I diabetes on insulin pump, prior cardiogenic shock placed on CRRT, non-compliance, poor historian   Significant Hospital Events: Including procedures, antibiotic start and stop dates in addition to other pertinent events   Admitted 1/15 with nausea vomiting, volume overload and need for dialysis. 1/16 nephrology consulted for hyperkalemia with plan to initiate peritoneal dialysis: Developed hypotension while in ER critical care consulted.  Interim History / Subjective:   Nausea improved Objective   Blood pressure (Abnormal) 82/55, pulse 80, temperature 98 F (36.7 C), resp. rate 17, height 5' (1.524 m), weight 54.4 kg, SpO2 91 %.       No intake or output data in the 24 hours ending 09/12/22 1423 Filed Weights   09/11/22 2159  Weight: 54.4 kg    Examination: General: 27 year old female currently resting in bed and in no acute distress.  However just prior to critical care arrival nursing reports lethargic and hypotensive  HENT: Normocephalic atraumatic no clear jugular venous distention sclera are nonicteric Lungs: Clear diminished bases Cardiovascular: Normal sinus rhythm soft systolic murmur audible Abdomen: Soft, bowel sounds present.  There is a insulin pump device attached to her left lower quadrant is not currently operational, her PD catheter exits the right lower quadrant of her abdomen and is a tunneled catheter Extremities: Slightly cool to touch palpable pulses Neuro: Awake oriented GU: An uric  Resolved Hospital Problem list     Assessment & Plan:   Acute systolic heart failure with known EF of 13 to 20%, with exam concerning for occult cardiogenic shock Plan Change admission to the intensive  care Continue telemetry monitoring Hold her home hydralazine, and carvedilol as well as  isosorbide with current hypotension Check lactic acid if elevated  may need to consider central access with SCVO2 monitoring and possible inotropic/vasopressor support Appreciate in advance advanced heart failure, they have been notified by internal medicine Will try to get some volume off with peritoneal dialysis, however of note she has had did not require CRRT in the past and certainly is at risk for this again  Prolonged qtc Plan Daily 12 lead especially as she is on reglan   End-stage renal disease with metabolic acidosis and what seems to be most likely failed peritoneal dialysis, although whether or not she has been following her dialysis prescription as instructed is unclear Plan Peritoneal dialysis per nephrology Close observation of chemistries  Severe hyperkalemia Has already received temporizing agents Has been seen by nephrology Plan Initiating peritoneal dialysis If intolerant may need to place central line and HD catheter  Cyclic nausea and vomiting.  This has been a already workup issue by gastroenterology does have a history of gastroparesis Plan As needed antiemetics PPI  Anemia of chronic disease  Plan Continue to monitor Trigger for transfusion less than 7 or active bleeding  Type 1 diabetes with hyperglycemia Plan Sliding scale insulin  Metabolic bone disease Plan Continuing home meds Best Practice (right click and "Reselect all SmartList Selections" daily)   Diet/type: Regular consistency (see orders) DVT prophylaxis: prophylactic heparin  GI prophylaxis: PPI Lines: N/A Foley:  N/A Code Status:  full code Last date of multidisciplinary goals of care discussion [pending ]  Labs   CBC: Recent Labs  Lab 09/06/22 1516 09/06/22 1919 09/11/22 2222 09/12/22 0841  WBC 5.8  --  6.2 6.6  NEUTROABS 4.8  --   --   --   HGB 11.2* 12.2 10.6* 10.7*  HCT 34.0* 36.0 33.8* 32.6*  MCV 96.9  --  100.6* 96.4  PLT 337  --  266 024    Basic Metabolic Panel: Recent Labs  Lab 09/06/22 1516 09/06/22 1919 09/06/22 2116  09/11/22 2222 09/12/22 0008 09/12/22 0235 09/12/22 0841  NA 133* 131* 132* 136  --  136 136  K 6.5* 6.9* 5.4* 5.9*  --  7.0* 5.9*  CL 98  --  95* 99  --  98 97*  CO2 16*  --  18* 18*  --  18* 17*  GLUCOSE 142*  --  90 67*  --  162* 99  BUN 97*  --  102* 106*  --  108* 108*  CREATININE 12.18*  --  12.25* 13.04*  --  12.99* 12.34*  CALCIUM 7.5*  --  7.4* 7.3*  --  7.3* 7.7*  MG  --   --   --   --  2.0  --   --    GFR: Estimated Creatinine Clearance: 5 mL/min (A) (by C-G formula based on SCr of 12.34 mg/dL (H)). Recent Labs  Lab 09/06/22 1516 09/11/22 2222 09/12/22 0841  WBC 5.8 6.2 6.6    Liver Function Tests: Recent Labs  Lab 09/06/22 1516 09/11/22 2222  AST 20 40  ALT 21 32  ALKPHOS 90 95  BILITOT 0.4 0.9  PROT 7.1 6.3*  ALBUMIN 3.3* 3.1*   Recent Labs  Lab 09/06/22 1516  LIPASE 33   No results for input(s): "AMMONIA" in the last 168 hours.  ABG    Component Value Date/Time   PHART 7.410 06/17/2019 1040   PCO2ART 38.3 06/17/2019 1040  PO2ART 96.5 06/17/2019 1040   HCO3 19.3 (L) 09/12/2022 0008   TCO2 20 (L) 09/06/2022 1919   ACIDBASEDEF 7.1 (H) 09/12/2022 0008   O2SAT 42.4 09/12/2022 0008     Coagulation Profile: No results for input(s): "INR", "PROTIME" in the last 168 hours.  Cardiac Enzymes: No results for input(s): "CKTOTAL", "CKMB", "CKMBINDEX", "TROPONINI" in the last 168 hours.  HbA1C: Hgb A1c MFr Bld  Date/Time Value Ref Range Status  06/27/2022 11:37 PM 8.6 (H) 4.8 - 5.6 % Corrected    Comment:    (NOTE)         Prediabetes: 5.7 - 6.4         Diabetes: >6.4         Glycemic control for adults with diabetes: <7.0 CORRECTED ON 11/02 AT 0035: PREVIOUSLY REPORTED AS 8.1   11/23/2021 06:28 PM 9.2 (H) 4.8 - 5.6 % Final    Comment:    (NOTE) Pre diabetes:          5.7%-6.4%  Diabetes:              >6.4%  Glycemic control for   <7.0% adults with diabetes     CBG: Recent Labs  Lab 09/12/22 0610 09/12/22 0653 09/12/22 0732  09/12/22 0855 09/12/22 0947  GLUCAP 34* 94 132* 113* 126*    Review of Systems:   Review of Systems  Constitutional:  Positive for malaise/fatigue. Negative for chills and fever.  HENT: Negative.    Eyes: Negative.   Respiratory:  Negative for cough, sputum production and shortness of breath.   Cardiovascular:  Positive for chest pain and leg swelling.  Gastrointestinal:  Positive for nausea and vomiting.  Genitourinary: Negative.   Musculoskeletal: Negative.   Neurological: Negative.   Endo/Heme/Allergies: Negative.   Psychiatric/Behavioral: Negative.       Past Medical History:  She,  has a past medical history of Anemia, Anxiety, Bipolar 2 disorder ([place]), Chronic kidney disease, Chronic systolic (congestive) heart failure ([place]), Depression, DKA (diabetic ketoacidoses), ESRD on peritoneal dialysis ([place]), HSV infection, Hypokalemia, Leukocytosis, Migraine, Noncompliance with medication regimen, Preeclampsia, Prolonged QT syndrome, Severe anemia, and Type 1 diabetes mellitus ([place]).   Surgical History:   Past Surgical History:  Procedure Laterality Date   BIOPSY  04/24/2022   Procedure: BIOPSY;  Surgeon: [name], MD;  Location: [facility] ENDOSCOPY;  Service: Gastroenterology;;   CARDIAC CATHETERIZATION     COLONOSCOPY WITH PROPOFOL N/A 04/24/2022   Procedure: COLONOSCOPY WITH PROPOFOL;  Surgeon: [name], MD;  Location: [place];  Service: Gastroenterology;  Laterality: N/A;   DILATION AND EVACUATION N/A 10/22/2019   Procedure: ULTRASOUND GUIDED DILATATION AND EVACUATION;  Surgeon: [name], MD;  Location: [facility] LD ORS;  Service: Gynecology;  Laterality: N/A;   ESOPHAGOGASTRODUODENOSCOPY (EGD) WITH PROPOFOL N/A 04/24/2022   Procedure: ESOPHAGOGASTRODUODENOSCOPY (EGD) WITH PROPOFOL;  Surgeon: [name], MD;  Location: [place];  Service: Gastroenterology;  Laterality: N/A;   NO PAST SURGERIES     peritoneal dialysis catheter insertion     RENAL  BIOPSY       Social History:   reports that she has quit smoking. Her smoking use included cigars and cigarettes. She has a 2.00 pack-year smoking history. She has never used smokeless tobacco. She reports that she does not drink alcohol and does not use drugs.   Family History:  Her family history is negative for Heart disease. She was adopted.   Allergies Allergies  Allergen Reactions   Cantaloupe Extract Allergy Skin  Test Itching    Mouth itching     Strawberry Extract Itching    Mouth itches   Citrullus Vulgaris Itching    Makes mouth itch , ALL melons    Nsaids Other (See Comments)    Avoid per nephrology Other reaction(s): Other Avoid per nephrology Avoid per nephrology     Home Medications  Prior to Admission medications   Medication Sig Start Date End Date Taking? Authorizing Provider  acetaminophen (TYLENOL) 500 MG tablet Take 1,000 mg by mouth every 6 (six) hours as needed for headache (pain).    [provider]  amitriptyline (ELAVIL) 50 MG tablet Take 1 tablet (50 mg total) by mouth at bedtime. 08/30/22   Salley Slaughter, NP  atorvastatin (LIPITOR) 40 MG tablet Take 40 mg by mouth daily.    [provider]  butalbital-acetaminophen-caffeine (FIORICET) 50-325-40 MG tablet Take 1 tablet by mouth daily as needed for headache.    [provider]  calcitRIOL (ROCALTROL) 0.25 MCG capsule Take 0.25 mcg by mouth every morning. 08/03/21   [provider]  calcium carbonate (TUMS - DOSED IN MG ELEMENTAL CALCIUM) 500 MG chewable tablet Take 2 tablets by mouth 3 (three) times daily with meals.    [provider]  carvedilol (COREG) 12.5 MG tablet Take 12.5 mg by mouth 2 (two) times daily with a meal.    [provider]  clonazePAM (KLONOPIN) 0.5 MG tablet Take 1 tablet (0.5 mg total) by mouth 2 (two) times daily as needed for anxiety. 03/16/22   [name], MD  Continuous Blood Gluc Sensor (DEXCOM G6 SENSOR) MISC  Apply topically once a week. 09/08/22   [provider]  hydrALAZINE (APRESOLINE) 100 MG tablet Take 100 mg by mouth 3 (three) times daily. 08/05/21   [provider]  hydrALAZINE (APRESOLINE) 50 MG tablet Take 50 mg by mouth 3 (three) times daily. 08/23/22   [provider]  hydrOXYzine (ATARAX) 10 MG tablet Take 1 tablet (10 mg total) by mouth 3 (three) times daily as needed. 08/30/22   Salley Slaughter, NP  Insulin Disposable Pump (OMNIPOD 5 G6 POD, GEN 5,) MISC SMARTSIG:SUB-Q Every 3 Days 08/31/22   [provider]  insulin lispro (HUMALOG) 100 UNIT/ML injection Inject 0.5 Units into the skin See admin instructions. Via Insulin Pump - 1/2 unit per hour 01/08/22   [provider]  isosorbide mononitrate (IMDUR) 30 MG 24 hr tablet Take 30 mg by mouth in the morning and at bedtime.    [provider]  LORazepam (ATIVAN) 1 MG tablet Take 0.5 tablets (0.5 mg total) by mouth 3 (three) times daily as needed. 08/11/22   Mesner, Corene Cornea, MD  magnesium oxide (MAG-OX) 400 MG tablet Take 400 mg by mouth 2 (two) times daily. 08/03/21   [provider]  methocarbamol (ROBAXIN) 500 MG tablet Take 500 mg by mouth every 8 (eight) hours as needed for muscle spasms.    [provider]  methocarbamol (ROBAXIN) 500 MG tablet Take 1 tablet (500 mg total) by mouth 2 (two) times daily as needed for up to 15 doses for muscle spasms. 09/06/22   Wyvonnia Dusky, MD  metoCLOPramide (REGLAN) 10 MG tablet Take 10 mg by mouth 4 (four) times daily as needed. 08/15/22   [provider]  ondansetron (ZOFRAN) 4 MG tablet Take 1 tablet (4 mg total) by mouth every 6 (six) hours. Patient taking differently: Take 4 mg by mouth every 6 (six) hours as needed  for nausea or vomiting. 04/11/22   [name], PA  pantoprazole (PROTONIX) 40 MG tablet Take 1 tablet (40 mg total) by mouth 2 (two) times daily before a meal. 08/11/22   Mesner, Corene Cornea, MD  Vitamin  D, Ergocalciferol, (DRISDOL) 1.25 MG (50000 UNIT) CAPS capsule Take 50,000 Units by mouth every Sunday.    [provider]  escitalopram (LEXAPRO) 10 MG tablet Take 20 mg by mouth daily.  05/10/20 10/06/20  [provider]  furosemide (LASIX) 40 MG tablet Take 1 tablet (40 mg total) by mouth daily. Patient not taking: Reported on 02/16/2021 10/28/19 02/17/21  [name], MD  lisinopril (ZESTRIL) 10 MG tablet Take 10 mg by mouth daily.  02/23/20 10/06/20  [provider]  spironolactone (ALDACTONE) 25 MG tablet Take 25 mg by mouth daily.  04/20/20 10/06/20  [provider]     Critical care time: 32 min      Erick Colace ACNP-BC [place] Pager # [phone] OR # [phone] if no answer

## 2022-09-12 NOTE — ED Notes (Signed)
Dialysis at bedside to hook patient up to peritoneal dialysis

## 2022-09-12 NOTE — ED Notes (Signed)
Critical care at bedside  

## 2022-09-12 NOTE — ED Notes (Signed)
Provider notified of patient hypotension

## 2022-09-12 NOTE — H&P (Signed)
PCP:   [place].   Chief Complaint:  Nausea vomiting  HPI: This is a 27 year old female with ESRD on PD, nonischemic CM with EF 08%, HTN and cyclic nausea and vomiting, bipolar, prolonged QT, dyslipidemia, DM type I.  Patient states she has been having chest pain, nausea, vomiting abdominal pain for 1 day.  Patient is a poor historian, does not say more than 1 or 2 words at a time then turns over to go to sleep.  Her last PD was last night  Patient is a history of cyclic vomiting, and has been worked up by gastroenterologist.  Review of Systems:  The patient denies anorexia, fever, weight loss,, vision loss, decreased hearing, hoarseness, syncope, dyspnea on exertion, peripheral edema, balance deficits, hemoptysis, abdominal pain, melena, hematochezia, severe indigestion/heartburn, hematuria, incontinence, genital sores, muscle weakness, suspicious skin lesions, transient blindness, difficulty walking, depression, unusual weight change, abnormal bleeding, enlarged lymph nodes, angioedema, and breast masses. Positives: Nausea, vomiting, chest discomfort  Past Medical History: Past Medical History:  Diagnosis Date   Anemia    Anxiety    Bipolar 2 disorder (HCC)    Chronic kidney disease    Chronic systolic (congestive) heart failure (HCC)    Depression    DKA (diabetic ketoacidoses)    ESRD on peritoneal dialysis ([place])    HSV infection    on valtrex   Hypokalemia    Leukocytosis    Migraine    Noncompliance with medication regimen    Preeclampsia    Prolonged QT syndrome    Severe anemia    Type 1 diabetes mellitus ([place])    Past Surgical History:  Procedure Laterality Date   BIOPSY  04/24/2022   Procedure: BIOPSY;  Surgeon: [name], MD;  Location: [facility];  Service: Gastroenterology;;   CARDIAC CATHETERIZATION     COLONOSCOPY WITH PROPOFOL N/A 04/24/2022   Procedure: COLONOSCOPY WITH PROPOFOL;  Surgeon: [name], MD;  Location: [facility];  Service: Gastroenterology;  Laterality: N/A;   DILATION AND EVACUATION N/A 10/22/2019   Procedure: ULTRASOUND GUIDED DILATATION AND EVACUATION;  Surgeon: [name], MD;  Location: [facility];  Service: Gynecology;  Laterality: N/A;   ESOPHAGOGASTRODUODENOSCOPY (EGD) WITH PROPOFOL N/A 04/24/2022   Procedure: ESOPHAGOGASTRODUODENOSCOPY (EGD) WITH PROPOFOL;  Surgeon: [name], MD;  Location: [facility];  Service: Gastroenterology;  Laterality: N/A;   NO PAST SURGERIES     peritoneal dialysis catheter insertion     RENAL BIOPSY      Medications: Prior to Admission medications   Medication Sig Start Date End Date Taking? Authorizing Provider  acetaminophen (TYLENOL) 500 MG tablet Take 1,000 mg by mouth every 6 (six) hours as needed for headache (pain).    [provider]  amitriptyline (ELAVIL) 50 MG tablet Take 1 tablet (50 mg total) by mouth at bedtime. 08/30/22   Salley Slaughter, NP  atorvastatin (LIPITOR) 40 MG tablet Take 40 mg by mouth daily.    [provider]  butalbital-acetaminophen-caffeine (FIORICET) 50-325-40 MG tablet Take 1 tablet by mouth daily as needed for headache.    [provider]  calcitRIOL (ROCALTROL) 0.25 MCG capsule Take 0.25 mcg by mouth every morning. 08/03/21   [provider]  calcium carbonate (TUMS - DOSED IN MG ELEMENTAL CALCIUM) 500 MG chewable tablet Take 2 tablets by mouth 3 (three) times daily with meals.    [provider]  carvedilol (COREG) 12.5 MG tablet Take 12.5 mg by mouth 2 (two) times  daily with a meal.    [provider]  clonazePAM (KLONOPIN) 0.5 MG tablet Take 1 tablet (0.5 mg total) by mouth 2 (two) times daily as needed for anxiety. 03/16/22   [name], MD  hydrALAZINE (APRESOLINE) 100 MG tablet Take 100 mg by mouth 3 (three) times daily. 08/05/21   [provider]  hydrOXYzine (ATARAX) 10 MG tablet Take 1 tablet (10 mg total) by mouth 3 (three)  times daily as needed. 08/30/22   Eulis Canner E, NP  insulin lispro (HUMALOG) 100 UNIT/ML injection Inject 0.5 Units into the skin See admin instructions. Via Insulin Pump - 1/2 unit per hour 01/08/22   [provider]  isosorbide mononitrate (IMDUR) 30 MG 24 hr tablet Take 30 mg by mouth in the morning and at bedtime.    [provider]  LORazepam (ATIVAN) 1 MG tablet Take 0.5 tablets (0.5 mg total) by mouth 3 (three) times daily as needed. 08/11/22   Mesner, Corene Cornea, MD  magnesium oxide (MAG-OX) 400 MG tablet Take 400 mg by mouth 2 (two) times daily. 08/03/21   [provider]  methocarbamol (ROBAXIN) 500 MG tablet Take 500 mg by mouth every 8 (eight) hours as needed for muscle spasms.    [provider]  methocarbamol (ROBAXIN) 500 MG tablet Take 1 tablet (500 mg total) by mouth 2 (two) times daily as needed for up to 15 doses for muscle spasms. 09/06/22   Wyvonnia Dusky, MD  ondansetron (ZOFRAN) 4 MG tablet Take 1 tablet (4 mg total) by mouth every 6 (six) hours. Patient taking differently: Take 4 mg by mouth every 6 (six) hours as needed for nausea or vomiting. 04/11/22   [name], PA  pantoprazole (PROTONIX) 40 MG tablet Take 1 tablet (40 mg total) by mouth 2 (two) times daily before a meal. 08/11/22   Mesner, Corene Cornea, MD  Vitamin D, Ergocalciferol, (DRISDOL) 1.25 MG (50000 UNIT) CAPS capsule Take 50,000 Units by mouth every Sunday.    [provider]  escitalopram (LEXAPRO) 10 MG tablet Take 20 mg by mouth daily.  05/10/20 10/06/20  [provider]  furosemide (LASIX) 40 MG tablet Take 1 tablet (40 mg total) by mouth daily. Patient not taking: Reported on 02/16/2021 10/28/19 02/17/21  [name], MD  lisinopril (ZESTRIL) 10 MG tablet Take 10 mg by mouth daily.  02/23/20 10/06/20  [provider]  spironolactone (ALDACTONE) 25 MG tablet Take 25 mg by mouth daily.  04/20/20 10/06/20  [provider]    Allergies:    Allergies  Allergen Reactions   Cantaloupe Extract Allergy Skin Test Itching    Mouth itching     Strawberry Extract Itching    Mouth itches   Citrullus Vulgaris Itching    Makes mouth itch , ALL melons    Nsaids Other (See Comments)    Avoid per nephrology Other reaction(s): Other Avoid per nephrology Avoid per nephrology    Social History:  reports that she has quit smoking. Her smoking use included cigars and cigarettes. She has a 2.00 pack-year smoking history. She has never used smokeless tobacco. She reports that she does not drink alcohol and does not use drugs.  Family History: Family History  Adopted: Yes  Problem Relation Age of Onset   Heart disease Neg Hx     Physical Exam: Vitals:   09/11/22 2230 09/12/22 0030 09/12/22 0130 09/12/22 0138  BP: (!) 131/103 (!) 148/121 (!) 160/119   Pulse: (!) 109 (!) 114 Marland Kitchen)  116   Resp: (!) 23 19 (!) 30   Temp:    97.9 F (36.6 C)  TempSrc:    Oral  SpO2: 100% 98% 95%   Weight:      Height:        General:  Alert and oriented times three, well developed and nourished.  Weak appearing female Eyes: PERRLA, pink conjunctiva, no scleral icterus ENT: Dry oral mucosa, neck supple, no thyromegaly Lungs: clear to ascultation, no wheeze, no crackles, no use of accessory muscles Cardiovascular: regular rate and rhythm, no regurgitation, no gallops, no murmurs. No carotid bruits, no JVD Abdomen: soft, positive BS, non-tender, non-distended, no organomegaly, not an acute abdomen, distended but nontender, peritoneal dialysis in place GU: not examined Neuro: CN II - XII grossly intact, sensation intact Musculoskeletal: Appears to move all extremities equally Skin: no rash, no subcutaneous crepitation, no decubitus Psych: Not very interactive female   Labs on Admission:  Recent Labs    09/11/22 2222 09/12/22 0008  NA 136  --   K 5.9*  --   CL 99  --   CO2 18*  --   GLUCOSE 67*  --   BUN 106*  --   CREATININE 13.04*  --    CALCIUM 7.3*  --   MG  --  2.0   Recent Labs    09/11/22 2222  AST 40  ALT 32  ALKPHOS 95  BILITOT 0.9  PROT 6.3*  ALBUMIN 3.1*    Recent Labs    09/11/22 2222  WBC 6.2  HGB 10.6*  HCT 33.8*  MCV 100.6*  PLT 266    Radiological Exams on Admission: DG Abdomen 1 View  Result Date: 09/11/2022 CLINICAL DATA:  Stomach distention, concern for bowel obstruction. EXAM: ABDOMEN - 1 VIEW COMPARISON:  05/20/2021. FINDINGS: The bowel gas pattern is normal. No radio-opaque calculi. A peritoneal dialysis catheter is noted with tip coiling in the pelvis. The heart is enlarged. IMPRESSION: 1. No bowel obstruction. 2. Peritoneal dialysis catheter terminating in the pelvis. 3. Cardiomegaly. Electronically Signed   By: Brett Fairy M.D.   On: 09/11/2022 23:50   DG Chest 2 View  Result Date: 09/11/2022 CLINICAL DATA:  Chest pain, dizziness EXAM: CHEST - 2 VIEW COMPARISON:  08/24/2022 FINDINGS: Frontal and lateral views of the chest demonstrates stable enlargement of the cardiac silhouette. No airspace disease, effusion, or pneumothorax. No acute bony abnormality. IMPRESSION: 1. Stable enlarged cardiac silhouette. 2. No acute airspace disease. Electronically Signed   By: Randa Ngo M.D.   On: 09/11/2022 22:12    Dr Dayle Points note 05/25/2022 EGD Aug 28 - The examined portions of the nasopharynx, oropharynx and larynx were normal. - Normal esophagus. - Erythematous mucosa in the gastric body. Biopsied. - Friable duodenal mucosa. Biopsied. - No endoscopic abnormalities to explain patient's nausea, vomiting or abdominal pain. - Symptoms seem most consistent with cyclic vomiting syndrome   Gastric biopsies normal   Colonoscopy Aug 28 - Aborted due to extensive solid stool in colon.  Assessment/Plan Present on Admission:  Cyclical vomiting with nausea/ Gastroparesis -Admit to med telemetry -Schedule Compazine, Tegan as needed -Schedule Reglan -N.p.o. for now until less  nauseous -Continue Elavil   Hyperkalemia  ESRD on peritoneal dialysis/ Metabolic acidosis/  Increased anion gap -Nephrology consulted.  PD orders placed -Repeat potassium 7.  IV insulin, amp D50, amp of bicarb, calcium gluconate, IV insulin, nebulized -Atarax as needed   Type 1 diabetes mellitus  -Sliding scale insulin, insulin pump  Chronic systolic CHF (congestive heart failure), EF 20%/CAD -Hydralazine, Coreg, Imdur, atorvastatin, spironolactone   Bipolar 2 disorder (HCC) -As needed Ativan and Klonopin ordered -Lexapro resumed   Essential hypertension -Hydralazine, Coreg, lisinopril, Imdur   Dyslipidemia -Stable, Lipitor ordered   GERD -Protonix twice daily ordered   Prolonged QT interval  Kathy Lewis 09/12/2022, 3:11 AM

## 2022-09-12 NOTE — Progress Notes (Addendum)
PROGRESS NOTE    Kathy Lewis  QIW:[id number] DOB: November 16, 1995 DOA: 09/11/2022 PCP: [place].   Brief Narrative: 27 year old with past medical history significant for ESRD on PD, nonischemic cardiomyopathy with ejection fraction 20%, hypertension, history of cyclic nausea and vomiting, bipolar, prolonged QT, dyslipidemia, diabetes type 1, insulin pump patient is a poor historian.  Presented with nausea and vomiting.  Found to have multiple electrolytes abnormality: Hyper-K kalemia, increased anion gap metabolic acidosis, hypoglycemia.     Assessment & Plan:   Principal Problem:   Cyclical vomiting with nausea Active Problems:   ESRD on peritoneal dialysis (HCC)   Metabolic acidosis, increased anion gap   Prolonged QT interval   Essential hypertension   Chronic systolic CHF (congestive heart failure) (HCC)   Mixed diabetic hyperlipidemia associated with type 1 diabetes mellitus (HCC)   Normocytic anemia   Bipolar 2 disorder (HCC)   Hyperkalemia   Dyslipidemia   Gastroparesis   1-Cyclical vomiting and nausea/history of gastroparesis: Presents with recurrent nausea, vomiting.  Prior Endoscopy 03/2022 no endoscopy abnormalities.  Continue with schedule reglan. She was able to tolerates [place] this am.   2-Hyperkalemia ESRD on peritoneal dialysis She has received Lokelma, calcium gluconate, insulin, albuterol.  Nephrology consulted and is planning to do urgent CCPD.   3-Metabolic acidosis increased anion gap Suspect related to renal failure.  No hyperglycemia.  Plan for CCPD.   4-Diabetes type 1: Hypoglycemia Uses Insulin Pump at home. Hold for now.  Hypoglycemia, probably related to insulin received for Hyperkalemia.  Start Low dose Levemir.  SSI.  5-Chronic Systolic heart failure: Ef 20 % Hold Hydralazine due to Hypotension.  On Imdur.  Hold Coreg.  Heart failure team consulted , concern for HF, hypoperfusion.   6-Hypotension:  Received 250 cc  IV bolus.  SBP down to 70---after fluids 86 CCM consulted.  Discontinue hydralazine and coreg.  BP increase to 100.  Check lactic acid.   Acute encephalopathy; she became more sleepy.  Plan to check venous gas.  BP was 100 Monitor.   Bipolar 2 disorder: Continue with Elavil.   7-Hypertension: BP soft this afternoon.  Discontinue Hydralazine.  Holder parameter for Carvedilol/   8-Dyslipidemia: Resume lipitor.   GERD: PPI  Prolonged QT: Correct electrolytes.    Estimated body mass index is 23.44 kg/m as calculated from the following:   Height as of this encounter: 5' (1.524 m).   Weight as of this encounter: 54.4 kg.   DVT prophylaxis: heparin  Code Status: full code Family Communication: care discussed with patient.  Disposition Plan:  Status is: Inpatient Remains inpatient appropriate because: management of vomiting, hyperkalemia    Consultants:  Nephrology   Procedures:    Antimicrobials:    Subjective: She was alert, denies abdominal pain. She has been having vomiting since June on and off. Started again few days ago.   Seen this afternoon again, after patient became hypotensive, initially was asymptomatic. Received IV bolus, Placed on 3 L for sat in the 80%. She them became more sleepy.  She was talking about been [facility], but she now understand she is not stable enough to be transfer.   Objective: Vitals:   09/12/22 0615 09/12/22 0620 09/12/22 0645 09/12/22 0700  BP:   (!) 115/93 112/89  Pulse: (!) 119 (!) 118    Resp: 20 20 (!) 26 19  Temp:      TempSrc:      SpO2: 100% 100%    Weight:  Height:       No intake or output data in the 24 hours ending 09/12/22 0802 Filed Weights   09/11/22 2159  Weight: 54.4 kg    Examination:  General exam: Appears calm and comfortable  Respiratory system: Clear to auscultation. Respiratory effort normal. Cardiovascular system: S1 & S2 heard, RRR.  Gastrointestinal system: Abdomen is  distended, soft and nontender. No organomegaly or masses felt. Normal bowel sounds heard. PD catheter in placed Central nervous system: Alert and oriented.  Extremities: no edema   Data Reviewed: I have personally reviewed following labs and imaging studies  CBC: Recent Labs  Lab 09/06/22 1516 09/06/22 1919 09/11/22 2222  WBC 5.8  --  6.2  NEUTROABS 4.8  --   --   HGB 11.2* 12.2 10.6*  HCT 34.0* 36.0 33.8*  MCV 96.9  --  100.6*  PLT 337  --  786   Basic Metabolic Panel: Recent Labs  Lab 09/06/22 1516 09/06/22 1919 09/06/22 2116 09/11/22 2222 09/12/22 0008 09/12/22 0235  NA 133* 131* 132* 136  --  136  K 6.5* 6.9* 5.4* 5.9*  --  7.0*  CL 98  --  95* 99  --  98  CO2 16*  --  18* 18*  --  18*  GLUCOSE 142*  --  90 67*  --  162*  BUN 97*  --  102* 106*  --  108*  CREATININE 12.18*  --  12.25* 13.04*  --  12.99*  CALCIUM 7.5*  --  7.4* 7.3*  --  7.3*  MG  --   --   --   --  2.0  --    GFR: Estimated Creatinine Clearance: 4.7 mL/min (A) (by C-G formula based on SCr of 12.99 mg/dL (H)). Liver Function Tests: Recent Labs  Lab 09/06/22 1516 09/11/22 2222  AST 20 40  ALT 21 32  ALKPHOS 90 95  BILITOT 0.4 0.9  PROT 7.1 6.3*  ALBUMIN 3.3* 3.1*   Recent Labs  Lab 09/06/22 1516  LIPASE 33   No results for input(s): "AMMONIA" in the last 168 hours. Coagulation Profile: No results for input(s): "INR", "PROTIME" in the last 168 hours. Cardiac Enzymes: No results for input(s): "CKTOTAL", "CKMB", "CKMBINDEX", "TROPONINI" in the last 168 hours. BNP (last 3 results) No results for input(s): "PROBNP" in the last 8760 hours. HbA1C: No results for input(s): "HGBA1C" in the last 72 hours. CBG: Recent Labs  Lab 09/12/22 0029 09/12/22 0449 09/12/22 0610 09/12/22 0653 09/12/22 0732  GLUCAP 166* 133* 34* 94 132*   Lipid Profile: No results for input(s): "CHOL", "HDL", "LDLCALC", "TRIG", "CHOLHDL", "LDLDIRECT" in the last 72 hours. Thyroid Function Tests: No results  for input(s): "TSH", "T4TOTAL", "FREET4", "T3FREE", "THYROIDAB" in the last 72 hours. Anemia Panel: No results for input(s): "VITAMINB12", "FOLATE", "FERRITIN", "TIBC", "IRON", "RETICCTPCT" in the last 72 hours. Sepsis Labs: No results for input(s): "PROCALCITON", "LATICACIDVEN" in the last 168 hours.  No results found for this or any previous visit (from the past 240 hour(s)).       Radiology Studies: DG Abdomen 1 View  Result Date: 09/11/2022 CLINICAL DATA:  Stomach distention, concern for bowel obstruction. EXAM: ABDOMEN - 1 VIEW COMPARISON:  05/20/2021. FINDINGS: The bowel gas pattern is normal. No radio-opaque calculi. A peritoneal dialysis catheter is noted with tip coiling in the pelvis. The heart is enlarged. IMPRESSION: 1. No bowel obstruction. 2. Peritoneal dialysis catheter terminating in the pelvis. 3. Cardiomegaly. Electronically Signed   By: Mickel Baas  Lovena Le M.D.   On: 09/11/2022 23:50   DG Chest 2 View  Result Date: 09/11/2022 CLINICAL DATA:  Chest pain, dizziness EXAM: CHEST - 2 VIEW COMPARISON:  08/24/2022 FINDINGS: Frontal and lateral views of the chest demonstrates stable enlargement of the cardiac silhouette. No airspace disease, effusion, or pneumothorax. No acute bony abnormality. IMPRESSION: 1. Stable enlarged cardiac silhouette. 2. No acute airspace disease. Electronically Signed   By: Randa Ngo M.D.   On: 09/11/2022 22:12        Scheduled Meds:  amitriptyline  50 mg Oral QHS   carvedilol  12.5 mg Oral BID WC   gentamicin cream  1 Application Topical Daily   heparin  5,000 Units Subcutaneous Q8H   hydrALAZINE  100 mg Oral Q8H   insulin aspart  0-15 Units Subcutaneous TID WC   isosorbide mononitrate  30 mg Oral Daily   metoCLOPramide (REGLAN) injection  5 mg Intravenous Q6H   pantoprazole (PROTONIX) IV  40 mg Intravenous Q12H   sodium zirconium cyclosilicate  10 g Oral TID   Continuous Infusions:  dialysis solution 1.5% low-MG/low-CA       LOS: 0 days     Time spent: 35 minutes.     Elmarie Shiley, MD [facility]   If 7PM-7AM, please contact night-coverage [email or web address]  09/12/2022, 8:02 AM

## 2022-09-12 NOTE — Plan of Care (Signed)
Informed of patient in ER. ESRD on PD, HFrEF, DM1 p/w N/V, found to have K of 7. Shifting agents already ordered by primary service. Would recommend lokelma 10g TID for now and if making urine would recommend lasix 80mg  IV x 1 dose to help with kaliuresis.. Will arrange for PD once dialysis staff available (discussed with staff). Full consult note to follow, will obtain outpatient PD records. Per prev admissions/notes her PD orders are 4 exchanges of 1500 ml over 9 hours. Follow serial labs. Please call with any questions/concerns.  Gean Quint, MD [facility] P: [phone]

## 2022-09-12 NOTE — Consult Note (Addendum)
Advanced Heart Failure Team Consult Note   Primary Physician: [place]. PCP-Cardiologist:  None  Reason for Consultation: Acute on chronic systolic heart failure  HPI:    Kathy Lewis is seen today for evaluation of acute on chronic systolic heart failure at the request of Dr. Tacy Learn, PCCM.   Kathy Lewis is a 27 y.o., female with chronic systolic heart failure, DM, ESRD, prolonged QT, Bipolar 2 and anxiety.   Other Hx reviewed from chart: Hx of peripartum cardiomyopathy. ANA positive (autoimmune workup negative). Complicated childbirth 8/67/61: severe preeclampsia, postpartum hemorrhage, necessitating transfusion and D&C.   Was recently admitted 12/22-12/27/23 at [facility] w/ hyperglycemia, N/V, and volume overload requiring CRRT / peritoneal dialysis. Underwent RHC which showed severe volume overload, pulmonary hypertension and a low EF of 13%. Swan placed and started on DBA then milrinone. Milrinone weaned off 12/25. She was transferred to [facility] for further workup and was supposed to be scheduled for iHD but left AMA.   Reports compliance with meds. Failed to f/u with renal outpatient. Per patient report she left AMA because she didn't trust them. Reports SOB with activity. Has been worsening recently. Lives home with her parents and daughter. Has been doing most of her PD treatments. Denies ETOH, smoking or illicit drug use.   Presented to the ED with chest pain, N/V and abdominal pain. She was started on peritoneal dialysis per nephrology. In the ED labs significant for K 7, Cr. 13.06, BUN 109, CO2 16, BNP 2.4K, HsTrop 33>33, lactic acid 1.4, hgb A1c 8.6, CBG 148, resp panel (-), CXR negative for acute findings. Up 10 lbs since last discharge. Developed hypotension in the ED, PCCM was consulted and patient transferred to CVICU.   [place] in bed. Denies CP. No N/V.    Cardiac studies reviewed: TTE 08/14/22: EF 13%, mod MR, severe TR, PASP ~50, small effusion Echo  10/2021: EF of 20%. Small pericardial effusion. Indeterminate diastolic function    RHC 95/09/32:  Pressures   RA 18 mmHg/14 mmHg/13 mmHg RV 72 mmHg/3 mmHg/RVEDP: 15 mmHg PA 75 mmHg/38 mmHg/56 mmHg PCW 49 mmHg/47 mmHg/39 mmHg BP 159 mmHg/129 mmHg/150 mmHg Saturations   RA Saturation O2-Sat 61.7 % PA Saturation O2-Sat 56.5 % AO Saturation O2-Sat 98 % Derived Parameters   Thermal TPVR 18.7 Thermal PVR 5.75 TD Stroke Volume  28.77 Cardiac Output   Fick A-V O2 Diff   Fick O2 Consumption 201.33 Fick Predicted O2 Consumption 201.33 Fick CO 2.68 L/min Fick CI 1.7 Thermo CO 2.99 L/min Thermo CI 1.9  SPEP w/ no M spike on 07/30/20.  [place] on 07/29/20 with CO low-normal by TD (CO/CI) Thermodilution: 4.07/2.59) and normal cardiac output by Fick (CO/CI Fick: 4.7/3.0).  MRI 61' showed no evidence of infarction, myocarditis, or infiltrative patterns.  TTE on 07/30/20 with LVEF 35-40% but declined to 25-30% 04/2021 likely in the setting of noncompliance to medical therapies, which did improved.  TTE on 03/21/22 showed an EF of 20%     Home Medications Prior to Admission medications   Medication Sig Start Date End Date Taking? Authorizing Provider  acetaminophen (TYLENOL) 500 MG tablet Take 1,000 mg by mouth every 6 (six) hours as needed for headache (pain).    [provider]  amitriptyline (ELAVIL) 50 MG tablet Take 1 tablet (50 mg total) by mouth at bedtime. 08/30/22   Salley Slaughter, NP  atorvastatin (LIPITOR) 40 MG tablet Take 40 mg by mouth daily.    [provider]  butalbital-acetaminophen-caffeine (  FIORICET) 50-325-40 MG tablet Take 1 tablet by mouth daily as needed for headache.    [provider]  calcitRIOL (ROCALTROL) 0.25 MCG capsule Take 0.25 mcg by mouth every morning. 08/03/21   [provider]  calcium carbonate (TUMS - DOSED IN MG ELEMENTAL CALCIUM) 500 MG chewable tablet Take 2 tablets by mouth 3 (three) times daily with meals.    [provider]  carvedilol (COREG) 12.5 MG tablet Take 12.5 mg by mouth 2 (two) times daily with a meal.    [provider]  clonazePAM (KLONOPIN) 0.5 MG tablet Take 1 tablet (0.5 mg total) by mouth 2 (two) times daily as needed for anxiety. 03/16/22   [name], MD  Continuous Blood Gluc Sensor (DEXCOM G6 SENSOR) MISC Apply topically once a week. 09/08/22   [provider]  hydrALAZINE (APRESOLINE) 100 MG tablet Take 100 mg by mouth 3 (three) times daily. 08/05/21   [provider]  hydrALAZINE (APRESOLINE) 50 MG tablet Take 50 mg by mouth 3 (three) times daily. 08/23/22   [provider]  hydrOXYzine (ATARAX) 10 MG tablet Take 1 tablet (10 mg total) by mouth 3 (three) times daily as needed. 08/30/22   Salley Slaughter, NP  Insulin Disposable Pump (OMNIPOD 5 G6 POD, GEN 5,) MISC SMARTSIG:SUB-Q Every 3 Days 08/31/22   [provider]  insulin lispro (HUMALOG) 100 UNIT/ML injection Inject 0.5 Units into the skin See admin instructions. Via Insulin Pump - 1/2 unit per hour 01/08/22   [provider]  isosorbide mononitrate (IMDUR) 30 MG 24 hr tablet Take 30 mg by mouth in the morning and at bedtime.    [provider]  LORazepam (ATIVAN) 1 MG tablet Take 0.5 tablets (0.5 mg total) by mouth 3 (three) times daily as needed. 08/11/22   Mesner, Corene Cornea, MD  magnesium oxide (MAG-OX) 400 MG tablet Take 400 mg by mouth 2 (two) times daily. 08/03/21   [provider]  methocarbamol (ROBAXIN) 500 MG tablet Take 500 mg by mouth every 8 (eight) hours as needed for muscle spasms.    [provider]  methocarbamol (ROBAXIN) 500 MG tablet Take 1 tablet (500 mg total) by mouth 2 (two) times daily as needed for up to 15 doses for muscle spasms. 09/06/22   Wyvonnia Dusky, MD  metoCLOPramide (REGLAN) 10 MG tablet Take 10 mg by mouth 4 (four) times daily as needed. 08/15/22   [provider]  ondansetron (ZOFRAN) 4 MG tablet Take 1  tablet (4 mg total) by mouth every 6 (six) hours. Patient taking differently: Take 4 mg by mouth every 6 (six) hours as needed for nausea or vomiting. 04/11/22   [name], PA  pantoprazole (PROTONIX) 40 MG tablet Take 1 tablet (40 mg total) by mouth 2 (two) times daily before a meal. 08/11/22   Mesner, Corene Cornea, MD  Vitamin D, Ergocalciferol, (DRISDOL) 1.25 MG (50000 UNIT) CAPS capsule Take 50,000 Units by mouth every Sunday.    [provider]  escitalopram (LEXAPRO) 10 MG tablet Take 20 mg by mouth daily.  05/10/20 10/06/20  [provider]  furosemide (LASIX) 40 MG tablet Take 1 tablet (40 mg total) by mouth daily. Patient not taking: Reported on 02/16/2021 10/28/19 02/17/21  [name], MD  lisinopril (ZESTRIL) 10 MG tablet Take 10 mg by mouth daily.  02/23/20 10/06/20  [provider]  spironolactone (ALDACTONE) 25 MG tablet Take 25 mg by mouth daily.  04/20/20 10/06/20  [provider]    Past Medical History: Past Medical History:  Diagnosis Date   Anemia    Anxiety    Bipolar 2 disorder ([place])    Chronic kidney disease    Chronic systolic (congestive) heart failure (HCC)    Depression    DKA (diabetic ketoacidoses)    ESRD on peritoneal dialysis ([place])    HSV infection    on valtrex   Hypokalemia    Leukocytosis    Migraine    Noncompliance with medication regimen    Preeclampsia    Prolonged QT syndrome    Severe anemia    Type 1 diabetes mellitus ([place])     Past Surgical History: Past Surgical History:  Procedure Laterality Date   BIOPSY  04/24/2022   Procedure: BIOPSY;  Surgeon: [name], MD;  Location: [place];  Service: Gastroenterology;;   CARDIAC CATHETERIZATION     COLONOSCOPY WITH PROPOFOL N/A 04/24/2022   Procedure: COLONOSCOPY WITH PROPOFOL;  Surgeon: [name], MD;  Location: [place];  Service: Gastroenterology;  Laterality: N/A;   DILATION AND EVACUATION N/A 10/22/2019   Procedure:  ULTRASOUND GUIDED DILATATION AND EVACUATION;  Surgeon: [name], MD;  Location: [facility];  Service: Gynecology;  Laterality: N/A;   ESOPHAGOGASTRODUODENOSCOPY (EGD) WITH PROPOFOL N/A 04/24/2022   Procedure: ESOPHAGOGASTRODUODENOSCOPY (EGD) WITH PROPOFOL;  Surgeon: [name], MD;  Location: [place];  Service: Gastroenterology;  Laterality: N/A;   NO PAST SURGERIES     peritoneal dialysis catheter insertion     RENAL BIOPSY      Family History: Family History  Adopted: Yes  Problem Relation Age of Onset   Heart disease Neg Hx     Social History: Social History   Socioeconomic History   Marital status: Single    Spouse name: Not on file   Number of children: 1   Years of education: Not on file   Highest education level: Not on file  Occupational History   Occupation: unemployed   Occupation: n/a  Tobacco Use   Smoking status: Former    Packs/day: 1.00    Years: 2.00    Total pack years: 2.00    Types: Cigars, Cigarettes   Smokeless tobacco: Never  Vaping Use   Vaping Use: Never used  Substance and Sexual Activity   Alcohol use: No   Drug use: No   Sexual activity: Yes    Birth control/protection: None  Other Topics Concern   Not on file  Social History Narrative   Not on file   Social Determinants of Health   Financial Resource Strain: Not on file  Food Insecurity: No Food Insecurity (07/28/2022)   Hunger Vital Sign    Worried About Running Out of Food in the Last Year: Never true    Ran Out of Food in the Last Year: Never true  Transportation Needs: No Transportation Needs (07/28/2022)   PRAPARE - Hydrologist (Medical): No    Lack of Transportation (Non-Medical): No  Physical Activity: Not on file  Stress: Not on file  Social Connections: Not on file    Allergies:  Allergies  Allergen Reactions   Cantaloupe Extract Allergy Skin Test Itching    Mouth itching     Strawberry Extract Itching    Mouth  itches   Citrullus Vulgaris Itching    Makes mouth itch , ALL melons    Nsaids Other (See Comments)    Avoid per nephrology Other reaction(s): Other Avoid  per nephrology Avoid per nephrology    Objective:    Vital Signs:   Temp:  [97.4 F (36.3 C)-98 F (36.7 C)] 97.4 F (36.3 C) (01/16 1437) Pulse Rate:  [76-121] 77 (01/16 1515) Resp:  [16-30] 17 (01/16 1215) BP: (68-160)/(43-128) 117/68 (01/16 1515) SpO2:  [78 %-100 %] 100 % (01/16 1515) Weight:  [54.4 kg] 54.4 kg (01/15 2159)    Weight change: Filed Weights   09/11/22 2159  Weight: 54.4 kg    Intake/Output:  No intake or output data in the 24 hours ending 09/12/22 1540    Physical Exam    General:  chronically ill appearing.  No respiratory difficulty HEENT: face appears swollen Neck: supple. JVD to ear. Carotids 2+ bilat; no bruits. No lymphadenopathy or thyromegaly appreciated. Cor: PMI nondisplaced. Regular rate & rhythm. +S3 Lungs: clear, crackles at bases Abdomen: soft, nontender, slightly distended. PD site intact. No hepatosplenomegaly. No bruits or masses. Good bowel sounds. Extremities: no cyanosis, clubbing, rash, edema  Neuro: alert & oriented x 3, cranial nerves grossly intact. moves all 4 extremities w/o difficulty. Affect pleasant.   Telemetry   NSR 90s (Personally reviewed)    EKG    ST, BAE 109 bpm  Labs   Basic Metabolic Panel: Recent Labs  Lab 09/06/22 1516 09/06/22 1919 09/06/22 2116 09/11/22 2222 09/12/22 0008 09/12/22 0235 09/12/22 0841 09/12/22 1453  NA 133*   < > 132* 136  --  136 136 132*  K 6.5*   < > 5.4* 5.9*  --  7.0* 5.9* 5.8*  CL 98  --  95* 99  --  98 97*  --   CO2 16*  --  18* 18*  --  18* 17*  --   GLUCOSE 142*  --  90 67*  --  162* 99  --   BUN 97*  --  102* 106*  --  108* 108*  --   CREATININE 12.18*  --  12.25* 13.04*  --  12.99* 12.34*  --   CALCIUM 7.5*  --  7.4* 7.3*  --  7.3* 7.7*  --   MG  --   --   --   --  2.0  --   --   --    < > = values in  this interval not displayed.    Liver Function Tests: Recent Labs  Lab 09/06/22 1516 09/11/22 2222  AST 20 40  ALT 21 32  ALKPHOS 90 95  BILITOT 0.4 0.9  PROT 7.1 6.3*  ALBUMIN 3.3* 3.1*   Recent Labs  Lab 09/06/22 1516  LIPASE 33   No results for input(s): "AMMONIA" in the last 168 hours.  CBC: Recent Labs  Lab 09/06/22 1516 09/06/22 1919 09/11/22 2222 09/12/22 0841 09/12/22 1453  WBC 5.8  --  6.2 6.6  --   NEUTROABS 4.8  --   --   --   --   HGB 11.2* 12.2 10.6* 10.7* 9.9*  HCT 34.0* 36.0 33.8* 32.6* 29.0*  MCV 96.9  --  100.6* 96.4  --   PLT 337  --  266 255  --     Cardiac Enzymes: No results for input(s): "CKTOTAL", "CKMB", "CKMBINDEX", "TROPONINI" in the last 168 hours.  BNP: BNP (last 3 results) Recent Labs    11/22/21 1027 06/27/22 1730 09/11/22 2222  BNP 2,727.2* >4,500.0* 2,413.8*    ProBNP (last 3 results) No results for input(s): "PROBNP" in the last 8760 hours.   CBG: Recent Labs  Lab 09/12/22 0653 09/12/22 0732 09/12/22 0855 09/12/22 0947 09/12/22 1425  GLUCAP 94 132* 113* 126* 169*    Coagulation Studies: No results for input(s): "LABPROT", "INR" in the last 72 hours.   Imaging   DG Abdomen 1 View  Result Date: 09/11/2022 CLINICAL DATA:  Stomach distention, concern for bowel obstruction. EXAM: ABDOMEN - 1 VIEW COMPARISON:  05/20/2021. FINDINGS: The bowel gas pattern is normal. No radio-opaque calculi. A peritoneal dialysis catheter is noted with tip coiling in the pelvis. The heart is enlarged. IMPRESSION: 1. No bowel obstruction. 2. Peritoneal dialysis catheter terminating in the pelvis. 3. Cardiomegaly. Electronically Signed   By: Brett Fairy M.D.   On: 09/11/2022 23:50   DG Chest 2 View  Result Date: 09/11/2022 CLINICAL DATA:  Chest pain, dizziness EXAM: CHEST - 2 VIEW COMPARISON:  08/24/2022 FINDINGS: Frontal and lateral views of the chest demonstrates stable enlargement of the cardiac silhouette. No airspace disease,  effusion, or pneumothorax. No acute bony abnormality. IMPRESSION: 1. Stable enlarged cardiac silhouette. 2. No acute airspace disease. Electronically Signed   By: Randa Ngo M.D.   On: 09/11/2022 22:12     Medications:     Current Medications:  amitriptyline  50 mg Oral QHS   atorvastatin  40 mg Oral Daily   gentamicin cream  1 Application Topical Daily   heparin  5,000 Units Subcutaneous Q8H   insulin aspart  0-6 Units Subcutaneous TID WC   insulin detemir  5 Units Subcutaneous QHS   metoCLOPramide (REGLAN) injection  5 mg Intravenous Q6H   pantoprazole (PROTONIX) IV  40 mg Intravenous Q12H   sodium zirconium cyclosilicate  10 g Oral TID    Infusions:  dialysis solution 1.5% low-MG/low-CA Stopped (09/12/22 1044)      Patient Profile   Kathy Lewis is a 27 y.o., female with chronic systolic heart failure, DM, ESRD, prolonged QT, Bipolar 2 and anxiety. AHF team asked to see for acute on chronic systolic heart failure  Assessment/Plan   Acute on chronic systolic heart failure - Hx Peripartum CM 03/2020. SPEP w/ no M spike on 07/30/20. [place] 07/29/20:  CO low-normal by TD (CO/CI) Thermodilution: 4.07/2.59) and normal cardiac output by Fick (CO/CI Fick: 4.7/3.0) ECHOs 21' suggested evidence of possible non-compaction, but MRI 72' showed no evidence of infarction, myocarditis, or infiltrative patterns. TTE on 07/30/20 with LVEF 35-40% but declined to 25-30% around 04/2021 likely in the setting of noncompliance to medical therapies.  - TTE 08/14/22: EF 13%, mod MR, severe TR, PASP ~50, small effusion - Echo 10/2021: EF of 20%. Small pericardial effusion. Indeterminate diastolic function   - NYHA class IV on admission, suspect 2/2 ESRD, uncontrolled DM, HTN? - RHC at [facility]: RA 18, PCW 49, Fick CO 2.68 CI 1.7, TD CO 2.99 CI 1.9 - GDMT limited by ESRD - Hypotensive in the ED. If she doesn't tolerate or gets persistently hypotensive would use DBA.  - Consider transfer to [facility] if treatment  nonresponsive - Strict I&O, daily weights  End stage renal disease - per nephrology, plan for peritoneal dialysis (PD) - volume overloaded on exam, oliguric. Volume removal per nephrology  Hyperkalemia - K 7 on admission, 5.8 last. S/p lokelma - trend BMET with PD  Elevated trop - suspect demand ischemia - flat HsTrop 33>33  Prolonged QTc - chronic - follow daily EKG, last 514  DMI - per primary - Hgb A1c last 8.6  N/V - interrmittent, none on exam  Anemia of chronic disease - Hgb 9.9  Length of Stay: [place], NP  09/12/2022, 3:40 PM  Advanced Heart Failure Team Pager [phone] (M-F; 7a - 5p)  Please contact [facility] for night-coverage after hours (4p -7a ) and weekends on [email or web address]  Agree with above  19 y/o woman with DM1, ESRD on PD and presumed peripartum CM dating back to 2021 with severe LV dysfunction  Has been followed at [facility] AHF team but work-up complicated by lack of f/o and compliance issues. She says she has lost faith in them and wants to go to [place].   Recently admitted to [facility] and underwent RHC with markedly elevated filling pressures and low output. Started on inotropes. Also underwent CVVHD for several days but refused conversion to PD. Left AMA.   Now readmitted with n/v, ab pain wit recurrent fluid overload (about 10 pounds up from d/c weight), NYHA IIIB-IV symptoms and hypotension. Lactic acid ok  K 7.0  Denies missing any PD sessions. SOB with ADLs,.   General:  Lying in bed No resp difficulty HEENT: normal Neck: supple. JVP to ear Carotids 2+ bilat; no bruits. No lymphadenopathy or thryomegaly appreciated. Cor: PMI laterally displaced. Regular rate & rhythm. Prominent s3 Lungs: clear Abdomen: soft, nontender, + distended. + PD cath No hepatosplenomegaly. No bruits or masses. Good bowel sounds. Extremities: no cyanosis, clubbing, rash, 1+ edema Neuro: alert & orientedx3, cranial nerves grossly intact. moves all 4  extremities w/o difficulty. Affect pleasant  Complicated situation. She has low output HF with biventricular HF and marked volume overload in setting of ESRD on PD. Will support BP with dobutamine as needed to permit more effective PD. Keep in ICU tonight. We discussed possible transfer to [facility] in am for heart-kidney transplant evaluation. If PD ineffective will need CVVHD.   CRITICAL CARE Performed by: Glori Bickers  Total critical care time: 55 minutes  Critical care time was exclusive of separately billable procedures and treating other patients.  Critical care was necessary to treat or prevent imminent or life-threatening deterioration.  Critical care was time spent personally by me (independent of midlevel providers or residents) on the following activities: development of treatment plan with patient and/or surrogate as well as nursing, discussions with consultants, evaluation of patient's response to treatment, examination of patient, obtaining history from patient or surrogate, ordering and performing treatments and interventions, ordering and review of laboratory studies, ordering and review of radiographic studies, pulse oximetry and re-evaluation of patient's condition.  Glori Bickers, MD  11:53 PM

## 2022-09-12 NOTE — ED Notes (Signed)
Dr. Claria Dice at bedside

## 2022-09-12 NOTE — ED Notes (Signed)
Dr. Claria Dice notified of potassium of 7.0

## 2022-09-12 NOTE — Consult Note (Addendum)
[facility] Renal Consultation Note    Indication for Consultation:  Management of ESRD/hemodialysis; anemia, hypertension/volume and secondary hyperparathyroidism  Primary nephrologist: Thereasa Distance, Myrtle   HPI: Kathy Lewis is a 27 y.o. female with ESRD on CCPD, HFrEF, T1DM who is admitted with nausea/vomiting and found to be hyperkalemic  K+ 7.0 on arrival. Has received Lokelma, calcium gluconate, insulin, sodium bicarb and furosemide in the ED. Nephrology consulted for urgent dialysis. PD orders have been placed.  Labs this am Na 136, K 5.9, CO2 17, BUN 108 Cr 12.34, WBC 6.6 Hb 10.7   Seen and examined in the ED. BP 115/92 HR 102  Nausea is improving. Denies fevers, chills, chest pain/dyspnea. She has been on PD for about 1 year. She does not have any other dialysis access. Her last dialysis was the previous evening. She says she was doing back to back treatments because she missed a treatment and felt like she was swelling. She started vomiting and called EMS during the last treatment.   She was admitted to an [facility] 12/22-12/27/23 after being sent from Cardiology office with similar presentation.  She was also noted to have significant volume overload by RHC and and EF of 13%.  She was started on CRRT as well as dobutamine and milrinone to help with volume removal.  The plan was to transition to IHD and had an outpatient spot at [facility], however she left AMA.  The plan was to place a [facility] as an outpatient and start HD, however she failed to follow up and did not want to switch modalities.    Past Medical History:  Diagnosis Date   Anemia    Anxiety    Bipolar 2 disorder (HCC)    Chronic kidney disease    Chronic systolic (congestive) heart failure (HCC)    Depression    DKA (diabetic ketoacidoses)    ESRD on peritoneal dialysis ([place])    HSV infection    on valtrex   Hypokalemia    Leukocytosis    Migraine    Noncompliance with  medication regimen    Preeclampsia    Prolonged QT syndrome    Severe anemia    Type 1 diabetes mellitus ([place])    Past Surgical History:  Procedure Laterality Date   BIOPSY  04/24/2022   Procedure: BIOPSY;  Surgeon: [name], MD;  Location: [place];  Service: Gastroenterology;;   CARDIAC CATHETERIZATION     COLONOSCOPY WITH PROPOFOL N/A 04/24/2022   Procedure: COLONOSCOPY WITH PROPOFOL;  Surgeon: [name], MD;  Location: [place];  Service: Gastroenterology;  Laterality: N/A;   DILATION AND EVACUATION N/A 10/22/2019   Procedure: ULTRASOUND GUIDED DILATATION AND EVACUATION;  Surgeon: [name], MD;  Location: [facility];  Service: Gynecology;  Laterality: N/A;   ESOPHAGOGASTRODUODENOSCOPY (EGD) WITH PROPOFOL N/A 04/24/2022   Procedure: ESOPHAGOGASTRODUODENOSCOPY (EGD) WITH PROPOFOL;  Surgeon: [name], MD;  Location: [place];  Service: Gastroenterology;  Laterality: N/A;   NO PAST SURGERIES     peritoneal dialysis catheter insertion     RENAL BIOPSY     Family History  Adopted: Yes  Problem Relation Age of Onset   Heart disease Neg Hx    Social History:  reports that she has quit smoking. Her smoking use included cigars and cigarettes. She has a 2.00 pack-year smoking history. She has never used smokeless tobacco. She reports that she does not drink alcohol and does not use drugs. Allergies  Allergen Reactions   Cantaloupe Extract Allergy Skin Test Itching    Mouth itching     Strawberry Extract Itching    Mouth itches   Citrullus Vulgaris Itching    Makes mouth itch , ALL melons    Nsaids Other (See Comments)    Avoid per nephrology Other reaction(s): Other Avoid per nephrology Avoid per nephrology   Prior to Admission medications   Medication Sig Start Date End Date Taking? Authorizing Provider  acetaminophen (TYLENOL) 500 MG tablet Take 1,000 mg by mouth every 6 (six) hours as needed for headache (pain).    [provider]  amitriptyline (ELAVIL) 50 MG tablet Take 1 tablet (50 mg total) by mouth at bedtime. 08/30/22   Salley Slaughter, NP  atorvastatin (LIPITOR) 40 MG tablet Take 40 mg by mouth daily.    [provider]  butalbital-acetaminophen-caffeine (FIORICET) 50-325-40 MG tablet Take 1 tablet by mouth daily as needed for headache.    [provider]  calcitRIOL (ROCALTROL) 0.25 MCG capsule Take 0.25 mcg by mouth every morning. 08/03/21   [provider]  calcium carbonate (TUMS - DOSED IN MG ELEMENTAL CALCIUM) 500 MG chewable tablet Take 2 tablets by mouth 3 (three) times daily with meals.    [provider]  carvedilol (COREG) 12.5 MG tablet Take 12.5 mg by mouth 2 (two) times daily with a meal.    [provider]  clonazePAM (KLONOPIN) 0.5 MG tablet Take 1 tablet (0.5 mg total) by mouth 2 (two) times daily as needed for anxiety. 03/16/22   [name], MD  Continuous Blood Gluc Sensor (DEXCOM G6 SENSOR) MISC Apply topically once a week. 09/08/22   [provider]  hydrALAZINE (APRESOLINE) 100 MG tablet Take 100 mg by mouth 3 (three) times daily. 08/05/21   [provider]  hydrALAZINE (APRESOLINE) 50 MG tablet Take 50 mg by mouth 3 (three) times daily. 08/23/22   [provider]  hydrOXYzine (ATARAX) 10 MG tablet Take 1 tablet (10 mg total) by mouth 3 (three) times daily as needed. 08/30/22   Salley Slaughter, NP  Insulin Disposable Pump (OMNIPOD 5 G6 POD, GEN 5,) MISC SMARTSIG:SUB-Q Every 3 Days 08/31/22   [provider]  insulin lispro (HUMALOG) 100 UNIT/ML injection Inject 0.5 Units into the skin See admin instructions. Via Insulin Pump - 1/2 unit per hour 01/08/22   [provider]  isosorbide mononitrate (IMDUR) 30 MG 24 hr tablet Take 30 mg by mouth in the morning and at bedtime.    [provider]  LORazepam (ATIVAN) 1 MG tablet Take 0.5 tablets (0.5 mg total) by mouth 3 (three) times daily  as needed. 08/11/22   Mesner, Corene Cornea, MD  magnesium oxide (MAG-OX) 400 MG tablet Take 400 mg by mouth 2 (two) times daily. 08/03/21   [provider]  methocarbamol (ROBAXIN) 500 MG tablet Take 500 mg by mouth every 8 (eight) hours as needed for muscle spasms.    [provider]  methocarbamol (ROBAXIN) 500 MG tablet Take 1 tablet (500 mg total) by mouth 2 (two) times daily as needed for up to 15 doses for muscle spasms. 09/06/22   Wyvonnia Dusky, MD  metoCLOPramide (REGLAN) 10 MG tablet Take 10 mg by mouth 4 (four) times daily as needed. 08/15/22   [provider]  ondansetron (ZOFRAN) 4 MG tablet Take 1 tablet (4 mg total) by mouth every 6 (six) hours. Patient taking differently: Take 4 mg by mouth every 6 (  six) hours as needed for nausea or vomiting. 04/11/22   [name], PA  pantoprazole (PROTONIX) 40 MG tablet Take 1 tablet (40 mg total) by mouth 2 (two) times daily before a meal. 08/11/22   Mesner, Corene Cornea, MD  Vitamin D, Ergocalciferol, (DRISDOL) 1.25 MG (50000 UNIT) CAPS capsule Take 50,000 Units by mouth every Sunday.    [provider]  escitalopram (LEXAPRO) 10 MG tablet Take 20 mg by mouth daily.  05/10/20 10/06/20  [provider]  furosemide (LASIX) 40 MG tablet Take 1 tablet (40 mg total) by mouth daily. Patient not taking: Reported on 02/16/2021 10/28/19 02/17/21  [name], MD  lisinopril (ZESTRIL) 10 MG tablet Take 10 mg by mouth daily.  02/23/20 10/06/20  [provider]  spironolactone (ALDACTONE) 25 MG tablet Take 25 mg by mouth daily.  04/20/20 10/06/20  [provider]   Current Facility-Administered Medications  Medication Dose Route Frequency Provider Last Rate Last Admin   amitriptyline (ELAVIL) tablet 50 mg  50 mg Oral QHS Crosley, Debby, MD       carvedilol (COREG) tablet 12.5 mg  12.5 mg Oral BID WC Crosley, Debby, MD   12.5 mg at 09/12/22 0856   dialysis solution 1.5% low-MG/low-CA dianeal solution    Intraperitoneal Q24H Gean Quint, MD   Held at 09/12/22 1044   gentamicin cream (GARAMYCIN) 0.1 % 1 Application  1 Application Topical Daily Gean Quint, MD       heparin injection 5,000 Units  5,000 Units Subcutaneous Q8H Crosley, Debby, MD   5,000 Units at 09/12/22 0856   hydrALAZINE (APRESOLINE) tablet 100 mg  100 mg Oral Q8H Crosley, Debby, MD   100 mg at 09/12/22 0616   insulin aspart (novoLOG) injection 0-15 Units  0-15 Units Subcutaneous TID WC Crosley, Debby, MD       isosorbide mononitrate (IMDUR) 24 hr tablet 30 mg  30 mg Oral Daily Crosley, Debby, MD   30 mg at 09/12/22 1043   LORazepam (ATIVAN) tablet 0.5 mg  0.5 mg Oral TID PRN Quintella Baton, MD       metoCLOPramide (REGLAN) injection 5 mg  5 mg Intravenous Q6H Crosley, Debby, MD   5 mg at 09/12/22 0534   pantoprazole (PROTONIX) injection 40 mg  40 mg Intravenous Q12H Crosley, Debby, MD   40 mg at 09/12/22 1043   prochlorperazine (COMPAZINE) injection 10 mg  10 mg Intravenous Q6H PRN Crosley, Debby, MD   10 mg at 09/12/22 1048   sodium zirconium cyclosilicate (LOKELMA) packet 10 g  10 g Oral TID Quintella Baton, MD   10 g at 09/12/22 1043   trimethobenzamide (TIGAN) injection 200 mg  200 mg Intramuscular Q6H PRN Quintella Baton, MD       Current Outpatient Medications  Medication Sig Dispense Refill   acetaminophen (TYLENOL) 500 MG tablet Take 1,000 mg by mouth every 6 (six) hours as needed for headache (pain).     amitriptyline (ELAVIL) 50 MG tablet Take 1 tablet (50 mg total) by mouth at bedtime. 30 tablet 3   atorvastatin (LIPITOR) 40 MG tablet Take 40 mg by mouth daily.     butalbital-acetaminophen-caffeine (FIORICET) 50-325-40 MG tablet Take 1 tablet by mouth daily as needed for headache.     calcitRIOL (ROCALTROL) 0.25 MCG capsule Take 0.25 mcg by mouth every morning.     calcium carbonate (TUMS - DOSED IN MG ELEMENTAL CALCIUM) 500 MG chewable tablet Take 2 tablets by mouth 3 (three) times daily with meals.  carvedilol  (COREG) 12.5 MG tablet Take 12.5 mg by mouth 2 (two) times daily with a meal.     clonazePAM (KLONOPIN) 0.5 MG tablet Take 1 tablet (0.5 mg total) by mouth 2 (two) times daily as needed for anxiety. 20 tablet 0   Continuous Blood Gluc Sensor (DEXCOM G6 SENSOR) MISC Apply topically once a week.     hydrALAZINE (APRESOLINE) 100 MG tablet Take 100 mg by mouth 3 (three) times daily.     hydrALAZINE (APRESOLINE) 50 MG tablet Take 50 mg by mouth 3 (three) times daily.     hydrOXYzine (ATARAX) 10 MG tablet Take 1 tablet (10 mg total) by mouth 3 (three) times daily as needed. 90 tablet 3   Insulin Disposable Pump (OMNIPOD 5 G6 POD, GEN 5,) MISC SMARTSIG:SUB-Q Every 3 Days     insulin lispro (HUMALOG) 100 UNIT/ML injection Inject 0.5 Units into the skin See admin instructions. Via Insulin Pump - 1/2 unit per hour     isosorbide mononitrate (IMDUR) 30 MG 24 hr tablet Take 30 mg by mouth in the morning and at bedtime.     LORazepam (ATIVAN) 1 MG tablet Take 0.5 tablets (0.5 mg total) by mouth 3 (three) times daily as needed. 15 tablet 0   magnesium oxide (MAG-OX) 400 MG tablet Take 400 mg by mouth 2 (two) times daily.     methocarbamol (ROBAXIN) 500 MG tablet Take 500 mg by mouth every 8 (eight) hours as needed for muscle spasms.     methocarbamol (ROBAXIN) 500 MG tablet Take 1 tablet (500 mg total) by mouth 2 (two) times daily as needed for up to 15 doses for muscle spasms. 15 tablet 0   metoCLOPramide (REGLAN) 10 MG tablet Take 10 mg by mouth 4 (four) times daily as needed.     ondansetron (ZOFRAN) 4 MG tablet Take 1 tablet (4 mg total) by mouth every 6 (six) hours. (Patient taking differently: Take 4 mg by mouth every 6 (six) hours as needed for nausea or vomiting.) 90 tablet 3   pantoprazole (PROTONIX) 40 MG tablet Take 1 tablet (40 mg total) by mouth 2 (two) times daily before a meal. 60 tablet 3   Vitamin D, Ergocalciferol, (DRISDOL) 1.25 MG (50000 UNIT) CAPS capsule Take 50,000 Units by mouth every  Sunday.       ROS: As per HPI otherwise negative.  Physical Exam: Vitals:   09/12/22 0800 09/12/22 0830 09/12/22 0901 09/12/22 1045  BP: 106/76 (!) 122/99  (!) 115/92  Pulse: (!) 110 (!) 108  (!) 101  Resp: 17 19  19   Temp:   97.6 F (36.4 C)   TempSrc:   Axillary   SpO2: 95% (!) 86%  96%  Weight:      Height:         General: Appears comfortable, in no distress  Head: NCAT sclera not icteric MMM Neck: Supple. Trachea midline  Lungs: Clear bilaterally without wheezes, rales, or rhonchi. Normal WOB  Heart: RRR, no murmur, rub, or gallop  Abdomen: soft non-tender, slightly distended  Lower extremities:without edema or ischemic changes, no open wounds  Neuro: A & O X 3. Moves all extremities spontaneously. Psych:  Responds to questions appropriately with a normal affect. Dialysis Access: PD cath   Labs: Basic Metabolic Panel: Recent Labs  Lab 09/11/22 2222 09/12/22 0235 09/12/22 0841  NA 136 136 136  K 5.9* 7.0* 5.9*  CL 99 98 97*  CO2 18* 18* 17*  GLUCOSE 67* 162* 99  BUN 106* 108* 108*  CREATININE 13.04* 12.99* 12.34*  CALCIUM 7.3* 7.3* 7.7*   Liver Function Tests: Recent Labs  Lab 09/06/22 1516 09/11/22 2222  AST 20 40  ALT 21 32  ALKPHOS 90 95  BILITOT 0.4 0.9  PROT 7.1 6.3*  ALBUMIN 3.3* 3.1*   Recent Labs  Lab 09/06/22 1516  LIPASE 33   No results for input(s): "AMMONIA" in the last 168 hours. CBC: Recent Labs  Lab 09/06/22 1516 09/06/22 1919 09/11/22 2222 09/12/22 0841  WBC 5.8  --  6.2 6.6  NEUTROABS 4.8  --   --   --   HGB 11.2* 12.2 10.6* 10.7*  HCT 34.0* 36.0 33.8* 32.6*  MCV 96.9  --  100.6* 96.4  PLT 337  --  266 255   Cardiac Enzymes: No results for input(s): "CKTOTAL", "CKMB", "CKMBINDEX", "TROPONINI" in the last 168 hours. CBG: Recent Labs  Lab 09/12/22 0610 09/12/22 0653 09/12/22 0732 09/12/22 0855 09/12/22 0947  GLUCAP 34* 94 132* 113* 126*   Iron Studies: No results for input(s): "IRON", "TIBC", "TRANSFERRIN",  "FERRITIN" in the last 72 hours. Studies/Results: DG Abdomen 1 View  Result Date: 09/11/2022 CLINICAL DATA:  Stomach distention, concern for bowel obstruction. EXAM: ABDOMEN - 1 VIEW COMPARISON:  05/20/2021. FINDINGS: The bowel gas pattern is normal. No radio-opaque calculi. A peritoneal dialysis catheter is noted with tip coiling in the pelvis. The heart is enlarged. IMPRESSION: 1. No bowel obstruction. 2. Peritoneal dialysis catheter terminating in the pelvis. 3. Cardiomegaly. Electronically Signed   By: Brett Fairy M.D.   On: 09/11/2022 23:50   DG Chest 2 View  Result Date: 09/11/2022 CLINICAL DATA:  Chest pain, dizziness EXAM: CHEST - 2 VIEW COMPARISON:  08/24/2022 FINDINGS: Frontal and lateral views of the chest demonstrates stable enlargement of the cardiac silhouette. No airspace disease, effusion, or pneumothorax. No acute bony abnormality. IMPRESSION: 1. Stable enlarged cardiac silhouette. 2. No acute airspace disease. Electronically Signed   By: Randa Ngo M.D.   On: 09/11/2022 22:12    Dialysis Orders:  Primary nephrologist: Lufadeju  CCPD 4 exchanges 1500 fill volume 1:45 dwell time No daytime dwell  Patient reports dry weight 117 llbs.   Assessment/Plan: Hyperkalemia - secondary to missed dialysis. Has received temporizing agents in the ED. Continue Loklema. Continue CCPD here. Dialysis staff aware -trying to determine if PD can be done in the ED. Ideally needs room placement for PD. I spoke with the Dialysis staff and will start CCPD in the room in the ED as there is an extended waiting period for hospital rooms. ESRD -  As above. Continue CCPD. No other access options She may be failing PD as the plan at her last hospitalization at [facility] was to transition to IHD.  Will see how she responds to CCPD. Nausea/vomiting - per primary team  Hypertension/volume  - BP acceptable. UF as able  Anemia  - Hb at goal. No ESA needs  Metabolic bone disease -  Corr Ca ok. Continue home meds.   DMT1 - insulin  per primary   Kathy Child PA-C [facility] 09/12/2022, 11:37 AM   I have seen and examined this patient and agree with plan and assessment in the above note with renal recommendations/intervention highlighted.  Will start urgent CCPD in the ED due to hyperkalemia. [name] A Hawk Mones,MD 09/12/2022 1:00 PM

## 2022-09-13 DIAGNOSIS — I5023 Acute on chronic systolic (congestive) heart failure: Secondary | ICD-10-CM | POA: Diagnosis not present

## 2022-09-13 DIAGNOSIS — E875 Hyperkalemia: Secondary | ICD-10-CM | POA: Diagnosis not present

## 2022-09-13 DIAGNOSIS — N186 End stage renal disease: Secondary | ICD-10-CM | POA: Diagnosis not present

## 2022-09-13 DIAGNOSIS — R112 Nausea with vomiting, unspecified: Secondary | ICD-10-CM | POA: Diagnosis not present

## 2022-09-13 LAB — RENAL FUNCTION PANEL
Albumin: 2.4 g/dL — ABNORMAL LOW (ref 3.5–5.0)
Anion gap: 19 — ABNORMAL HIGH (ref 5–15)
BUN: 107 mg/dL — ABNORMAL HIGH (ref 6–20)
CO2: 18 mmol/L — ABNORMAL LOW (ref 22–32)
Calcium: 6.4 mg/dL — CL (ref 8.9–10.3)
Chloride: 94 mmol/L — ABNORMAL LOW (ref 98–111)
Creatinine, Ser: 12.71 mg/dL — ABNORMAL HIGH (ref 0.44–1.00)
GFR, Estimated: 4 mL/min — ABNORMAL LOW (ref >60)
Glucose, Bld: 188 mg/dL — ABNORMAL HIGH (ref 70–99)
Phosphorus: 9.9 mg/dL — ABNORMAL HIGH (ref 2.5–4.6)
Potassium: 5.2 mmol/L — ABNORMAL HIGH (ref 3.5–5.1)
Sodium: 131 mmol/L — ABNORMAL LOW (ref 135–145)

## 2022-09-13 LAB — GLUCOSE, CAPILLARY
Glucose-Capillary: 369 mg/dL — ABNORMAL HIGH (ref 70–99)
Glucose-Capillary: 252 mg/dL — ABNORMAL HIGH (ref 70–99)
Glucose-Capillary: 176 mg/dL — ABNORMAL HIGH (ref 70–99)
Glucose-Capillary: 109 mg/dL — ABNORMAL HIGH (ref 70–99)
Glucose-Capillary: 100 mg/dL — ABNORMAL HIGH (ref 70–99)
Glucose-Capillary: 295 mg/dL — ABNORMAL HIGH (ref 70–99)

## 2022-09-13 LAB — CBC
HCT: 28.2 % — ABNORMAL LOW (ref 36.0–46.0)
Hemoglobin: 9.5 g/dL — ABNORMAL LOW (ref 12.0–15.0)
MCH: 32.2 pg (ref 26.0–34.0)
MCHC: 33.7 g/dL (ref 30.0–36.0)
MCV: 95.6 fL (ref 80.0–100.0)
Platelets: 205 10*3/uL (ref 150–400)
RBC: 2.95 MIL/uL — ABNORMAL LOW (ref 3.87–5.11)
RDW: 15.6 % — ABNORMAL HIGH (ref 11.5–15.5)
WBC: 3.7 10*3/uL — ABNORMAL LOW (ref 4.0–10.5)
nRBC: 0.5 % — ABNORMAL HIGH (ref 0.0–0.2)

## 2022-09-13 MED ORDER — INSULIN DETEMIR 100 UNIT/ML ~~LOC~~ SOLN
10.0000 [IU] | Freq: Every day | SUBCUTANEOUS | Status: DC
  Administered 2022-09-14: 10 [IU] via SUBCUTANEOUS
  Filled 2022-09-13 (×4): qty 0.1

## 2022-09-13 MED ORDER — METOCLOPRAMIDE HCL 5 MG PO TABS: 5.0000 mg | ORAL_TABLET | Freq: Four times a day (QID) | ORAL | Status: DC | PRN

## 2022-09-13 MED ORDER — CALCIUM ACETATE (PHOS BINDER) 667 MG PO CAPS
1334.0000 mg | ORAL_CAPSULE | Freq: Three times a day (TID) | ORAL | Status: DC
  Administered 2022-09-13 – 2022-09-16 (×7): 1334 mg via ORAL
  Filled 2022-09-13 (×8): qty 2

## 2022-09-13 MED ORDER — INSULIN ASPART 100 UNIT/ML IJ SOLN
2.0000 [IU] | Freq: Three times a day (TID) | INTRAMUSCULAR | Status: DC
  Administered 2022-09-13: 2 [IU] via SUBCUTANEOUS

## 2022-09-13 MED ORDER — OXYCODONE HCL 5 MG PO TABS
5.0000 mg | ORAL_TABLET | Freq: Once | ORAL | Status: AC
  Administered 2022-09-13: 5 mg via ORAL
  Filled 2022-09-13: qty 1

## 2022-09-13 MED ORDER — INSULIN ASPART 100 UNIT/ML IJ SOLN
0.0000 [IU] | Freq: Three times a day (TID) | INTRAMUSCULAR | Status: DC
  Administered 2022-09-13: 3 [IU] via SUBCUTANEOUS

## 2022-09-13 MED ORDER — INSULIN ASPART 100 UNIT/ML IJ SOLN: 0.0000 [IU] | Freq: Every day | INTRAMUSCULAR | Status: DC

## 2022-09-13 MED ORDER — CLONAZEPAM 0.5 MG PO TABS
0.5000 mg | ORAL_TABLET | Freq: Every day | ORAL | Status: DC
  Administered 2022-09-13 – 2022-09-15 (×3): 0.5 mg via ORAL
  Filled 2022-09-13 (×3): qty 1

## 2022-09-13 MED ORDER — CALCIUM GLUCONATE-NACL 1-0.675 GM/50ML-% IV SOLN
1.0000 g | Freq: Once | INTRAVENOUS | Status: AC
  Administered 2022-09-13: 1000 mg via INTRAVENOUS
  Filled 2022-09-13: qty 50

## 2022-09-13 MED ORDER — INSULIN ASPART 100 UNIT/ML IJ SOLN
0.0000 [IU] | Freq: Three times a day (TID) | INTRAMUSCULAR | Status: DC
  Administered 2022-09-14: 1 [IU] via SUBCUTANEOUS

## 2022-09-13 NOTE — Progress Notes (Addendum)
Advanced Heart Failure Rounding Note  PCP-Cardiologist: None   Subjective:     Had peritoneal dialysis last night.   Feels okay this am. No dyspnea. No recurrent GI symptoms.   BP stable.   Objective:   Weight Range: 60.6 kg Body mass index is 26.09 kg/m.   Vital Signs:   Temp:  [97.4 F (36.3 C)-99.4 F (37.4 C)] 98.8 F (37.1 C) (01/17 0813) Pulse Rate:  [76-101] 96 (01/17 0800) Resp:  [15-23] 18 (01/17 0800) BP: (68-117)/(43-94) 117/94 (01/17 0800) SpO2:  [78 %-100 %] 100 % (01/17 0800) Weight:  [59.3 kg-60.6 kg] 60.6 kg (01/17 0500) Last BM Date : 09/12/22  Weight change: Filed Weights   09/11/22 2159 09/12/22 1600 09/13/22 0500  Weight: 54.4 kg 59.3 kg 60.6 kg    Intake/Output:   Intake/Output Summary (Last 24 hours) at 09/13/2022 0917 Last data filed at 09/13/2022 0400 Gross per 24 hour  Intake 774.17 ml  Output 18 ml  Net 756.17 ml      Physical Exam    General:  Lying comfortably in bed. No distress. HEENT: Normal Neck: Supple. JVP 12-14. Carotids 2+ bilat; no bruits.  Cor: PMI nondisplaced. Regular rate & rhythm. No rubs, gallops or murmurs. Lungs: Clear Abdomen: Soft, nontender, nondistended.  Extremities: No cyanosis, clubbing, rash, 1+ edema Neuro: Alert & orientedx3. Affect pleasant   Telemetry   SR 90s  Labs    CBC Recent Labs    09/11/22 2222 09/12/22 0841 09/12/22 1453  WBC 6.2 6.6  --   HGB 10.6* 10.7* 9.9*  HCT 33.8* 32.6* 29.0*  MCV 100.6* 96.4  --   PLT 266 255  --    Basic Metabolic Panel Recent Labs    09/12/22 0008 09/12/22 0235 09/12/22 0841 09/12/22 1423 09/12/22 1453  NA  --    < > 136 132* 132*  K  --    < > 5.9* 6.1* 5.8*  CL  --    < > 97* 96*  --   CO2  --    < > 17* 16*  --   GLUCOSE  --    < > 99 148*  --   BUN  --    < > 108* 109*  --   CREATININE  --    < > 12.34* 13.06*  --   CALCIUM  --    < > 7.7* 7.3*  --   MG 2.0  --   --   --   --    < > = values in this interval not displayed.    Liver Function Tests Recent Labs    09/11/22 2222  AST 40  ALT 32  ALKPHOS 95  BILITOT 0.9  PROT 6.3*  ALBUMIN 3.1*   No results for input(s): "LIPASE", "AMYLASE" in the last 72 hours. Cardiac Enzymes No results for input(s): "CKTOTAL", "CKMB", "CKMBINDEX", "TROPONINI" in the last 72 hours.  BNP: BNP (last 3 results) Recent Labs    11/22/21 1027 06/27/22 1730 09/11/22 2222  BNP 2,727.2* >4,500.0* 2,413.8*    ProBNP (last 3 results) No results for input(s): "PROBNP" in the last 8760 hours.   D-Dimer No results for input(s): "DDIMER" in the last 72 hours. Hemoglobin A1C No results for input(s): "HGBA1C" in the last 72 hours. Fasting Lipid Panel No results for input(s): "CHOL", "HDL", "LDLCALC", "TRIG", "CHOLHDL", "LDLDIRECT" in the last 72 hours. Thyroid Function Tests No results for input(s): "TSH", "T4TOTAL", "T3FREE", "THYROIDAB" in the last 72 hours.  Invalid input(s): "FREET3"  Other results:   Imaging    No results found.   Medications:     Scheduled Medications:  amitriptyline  50 mg Oral QHS   atorvastatin  40 mg Oral Daily   Chlorhexidine Gluconate Cloth  6 each Topical Daily   gentamicin cream  1 Application Topical Daily   heparin  5,000 Units Subcutaneous Q8H   insulin aspart  0-15 Units Subcutaneous TID WC   insulin aspart  0-6 Units Subcutaneous TID WC   insulin aspart  2 Units Subcutaneous TID WC   insulin detemir  5 Units Subcutaneous QHS   pantoprazole (PROTONIX) IV  40 mg Intravenous Q12H   sodium zirconium cyclosilicate  10 g Oral TID    Infusions:  dialysis solution 1.5% low-MG/low-CA Stopped (09/12/22 1044)    PRN Medications: acetaminophen, heparin sodium (porcine) 3,000 Units in dialysis solution 1.5% low-MG/low-CA 6,000 mL dialysis solution, LORazepam, metoCLOPramide, prochlorperazine    Patient Profile   27 y/o woman with DM1, ESRD on PD and presumed peripartum CM dating back to 2021 with severe LV dysfunction.  Has been followed at [facility] AHF team but work-up complicated by lack of f/o and compliance issues.    Recently admitted to [facility] and underwent RHC with markedly elevated filling pressures and low output. Started on inotropes. Also underwent CVVHD for several days but refused conversion to PD. Left AMA.    Now readmitted with n/v, ab pain wit recurrent fluid overload. AHF asked to see to assist with HF management.  Assessment/Plan    1. 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 [place] 07/29/20:  CO low-normal by TD (CO/CI) Thermodilution: 4.07/2.59) and normal cardiac output by Fick (CO/CI Fick: 4.7/3.0) ECHOs 21' suggested evidence of possible non-compaction, but MRI 52' showed no evidence of infarction, myocarditis, or infiltrative patterns. TTE on 07/30/20 with LVEF 35-40% but declined to 25-30% around 04/2021 likely in the setting of noncompliance with medical therapies.  - Echo 10/2021: EF of 20%. Small pericardial effusion. Indeterminate diastolic function   - Echo 08/14/22: EF 13%, mod MR, severe TR, PASP ~50, small effusion - RHC at [facility] 12/23: RA mean, PA mean 56, PCWP 39, PA sat 56%, Fick CI 1.7, Thermo CI 1.9 - Has been followed by AHF team at [facility] but management c/b noncompliance.  - Admitted with volume overload. Lactic acid okay.  - Had PD last night. Still appears volume up. Can use DBA as needed to support BP and permit volume removal with PD - Considering transfer to [facility] this admit for heart-kidney transplant.   2. End stage renal disease on PD - PD per Nephrology. If ineffective, will need CVVHD   Hyperkalemia - K 7 on admission, received lokelma - AM labs pending   4. DMI - per primary - Hgb A1c 8.6 12/23   5. Anemia of chronic disease - Hgb 9.9    Length of Stay: 1  FINCH, LINDSAY N, PA-C  09/13/2022, 9:17 AM  Advanced Heart Failure Team Pager [phone] (M-F; 7a - 5p)  Please contact [facility] for night-coverage after hours  (5p -7a ) and weekends on [email or web address]  Patient seen and examined with the above-signed Advanced Practice Provider and/or Housestaff. I personally reviewed laboratory data, imaging studies and relevant notes. I independently examined the patient and formulated the important aspects of the plan. I have edited the note to reflect any of my changes or salient points. I have personally discussed the plan with the patient  and/or family.  Responded well to PD. BP stable. Denies CP or SOB. Wants to go home  General:  Sitting up in bed No resp difficulty HEENT: normal Neck: supple. JVP to jaw . Carotids 2+ bilat; no bruits. No lymphadenopathy or thryomegaly appreciated. Cor: PMI nondisplaced. Regular rate & rhythm. +s3 Lungs: clear Abdomen: soft, nontender, nondistended. No hepatosplenomegaly. No bruits or masses. Good bowel sounds. + PD cath  Extremities: no cyanosis, clubbing, rash, 1-2+ edema Neuro: alert & orientedx3, cranial nerves grossly intact. moves all 4 extremities w/o difficulty. Affect pleasant  My sense is that decompensation is related to non-compliance and not managing PD well. Seems to be tolerating fine here.   Would encourage to keep her in house until volume off. If cardiac status limits volume removal with PD then could consider transfer to [facility] for further HF evaluation but I don't think this is the case.   I worry that noncompliance will be major barrier/hurdle to any sort of advanced therapy options.   Glori Bickers, MD  6:51 PM

## 2022-09-13 NOTE — Consult Note (Addendum)
NAME:  Kathy Lewis, MRN:  [id number], DOB:  08-22-1996, LOS: 1 ADMISSION DATE:  09/11/2022, CONSULTATION DATE:  1/16 REFERRING MD:  Regalado, CHIEF COMPLAINT:  shock   History of Present Illness:  This is a 27 year old female patient with significant history as noted below most specifically severe nonischemic cardiomyopathy with EF of 13 to 20%, and end-stage renal disease on peritoneal dialysis.  Recently hospitalized not long ago for severe volume overload and acute on chronic heart failure requiring CRRT for volume removal in addition to dobutamine and later milrinone support.  Ultimately was planned to be discharged and start intermittent hemodialysis however patient left AGAINST MEDICAL ADVICE and has yet to to start this.  Resented to the emergency room on 1/15 with chief complaint of dizziness, hypoglycemia, poor p.o. intake, and intermittent nausea and vomiting x 24 hours.  In the ER initially found to be normotensive with systolic blood pressure in the 140s to 160s, had potassium of 5.9, and metabolic acidosis.  Repeat potassium was 7.  Was to admitted for urgent nephrology consultation , Per kalemia treated with [facility], and patient was set for peritoneal dialysis as they awaited medical bed. She was seen by nephrology with plans to initiate peritoneal dialysis while in the emergency room given the extensive hospital weight for dialysis.  Her home medications had been resumed these included hydralazine and Coreg.  Over the course of her time in the emergency room she became progressively hypotensive with systolic blood pressure as low as the 60s.  Thus the reason for critical care consultation.  Current diagnostic studies: Normal white blood cell count, hemoglobin 10.7, capillary blood glucose 169, potassium 7, sodium 136 Koba positive metabolic acidosis.  Chest x-ray from day of presentation on 1/15 showed cardiomegaly without significant pulmonary edema Pertinent  Medical History  NICM EF  13-20%, ESRD on PD, h/o cyclic nausea and vomiting with gastroparesis bipolar disease prolonged Qt, dyslipidemia, type I diabetes on insulin pump, prior cardiogenic shock placed on CRRT, non-compliance, poor historian   Significant Hospital Events: Including procedures, antibiotic start and stop dates in addition to other pertinent events   Admitted 1/15 with nausea vomiting, volume overload and need for dialysis. 1/16 nephrology consulted for hyperkalemia with plan to initiate peritoneal dialysis: Developed hypotension while in ER critical care consulted.  Seen by critical care and advanced heart failure team moved to ICU for closer observation 1/17 tolerated peritoneal dialysis overall looking better hemodynamically stable ready to transfer to progressive care  Interim History / Subjective:  Feels better Objective   Blood pressure (Abnormal) 117/94, pulse 96, temperature 98.8 F (37.1 C), temperature source Oral, resp. rate 18, height 5' (1.524 m), weight 60.6 kg, SpO2 100 %.        Intake/Output Summary (Last 24 hours) at 09/13/2022 0830 Last data filed at 09/13/2022 0400 Gross per 24 hour  Intake 774.17 ml  Output 18 ml  Net 756.17 ml   Filed Weights   09/11/22 2159 09/12/22 1600 09/13/22 0500  Weight: 54.4 kg 59.3 kg 60.6 kg    Examination: General pleasant 27 year old female patient resting in bed no acute distress looks much improved when comparing exam from day prior HEENT normocephalic atraumatic no jugular venous distention appreciated mucous membranes are moist Pulmonary: Clear to auscultation diminished in the bases currently on room air Cardiac regular rate and rhythm Abdomen: Soft nontender no organomegaly Peritoneal catheter insertion site unremarkable.  This is a tunneled catheter. Extremities: Warm today, brisk capillary refill, no edema Neuro: Awake oriented  appropriate.  Resolved Hospital Problem list     Assessment & Plan:   Acute systolic heart failure  with known EF of 13 to 20% Lactate negative, overall physical exam improved Plan Okay to move to stepdown Continue telemetry monitoring Volume removal with peritoneal dialysis Holding her home hydralazine and Coreg Additional recommendations per cardiology  Prolonged qtc Current QTc 541 Plan Daily 12 lead especially as she is on reglan, this has been a long time medication, will repeat again in the morning we may need to discontinue this, for now we will change to oral as needed  End-stage renal disease with metabolic acidosis and what seems to be most likely failed peritoneal dialysis, although whether or not she has been following her dialysis prescription as instructed is unclear Plan Continuing peritoneal dialysis per nephrology  Severe hyperkalemia Status post peritoneal dialysis Plan Awaiting follow-up potassium, clinically looks much improved  Cyclic nausea and vomiting.  This has been a already workup issue by gastroenterology does have a history of gastroparesis, currently well-controlled Plan As needed antiemetics PPI  Anemia of chronic disease  Plan Continue to monitor Trigger for transfusion less than 7 or active bleeding  Type 1 diabetes with hyperglycemia, poor glycemic control currently Plan Sliding scale insulin, changed to moderate scale with mealtime coverage  Metabolic bone disease Plan Continuing home meds Best Practice (right click and "Reselect all SmartList Selections" daily)   Diet/type: Regular consistency (see orders) DVT prophylaxis: prophylactic heparin  GI prophylaxis: PPI Lines: N/A Foley:  N/A Code Status:  full code Last date of multidisciplinary goals of care discussion Callen.Antu ]  Critical care time: NA     Erick Colace ACNP-BC [place] Pager # [phone] OR # [phone] if no answer

## 2022-09-13 NOTE — Progress Notes (Signed)
[place] Progress Note Patient Name: Kathy Lewis DOB: February 28, 1996 MRN: [id number]   Date of Service  09/13/2022  HPI/Events of Note  Patient complaining of 8/10 back pain  that was not relieved by Tylenol, she is asking for something stronger for pain. She has chronic back pain going back years.  eICU Interventions  Oxycodone 5 mg PO x 1 dose ordered.        [name] Shelsie Tijerino 09/13/2022, 10:58 PM

## 2022-09-13 NOTE — Progress Notes (Signed)
Patient ID: Kathy Lewis, female   DOB: 02-15-1996, 27 y.o.   MRN: [id number] S: Developed hypotension in the ED so was admitted to ICU for CCPD.  She tolerated it well, however labs are still pending for this morning. O:BP (!) 117/94 (BP Location: Right Arm)   Pulse 96   Temp 98.8 F (37.1 C) (Oral)   Resp 18   Ht 5' (1.524 m)   Wt 60.6 kg   SpO2 100%   BMI 26.09 kg/m   Intake/Output Summary (Last 24 hours) at 09/13/2022 0955 Last data filed at 09/13/2022 0400 Gross per 24 hour  Intake 774.17 ml  Output 18 ml  Net 756.17 ml   Intake/Output: I/O last 3 completed shifts: In: 774.2 [P.O.:720; IV Piggyback:54.2] Out: 18 [Urine:1; Other:16; Stool:1]  Intake/Output this shift:  No intake/output data recorded. Weight change: 4.868 kg Gen: NAD CVS: RRR Resp: CTA Abd: +BS, soft, NT/ND, PD catheter exit site is clean and without erythema or drainage Ext: no edema  Recent Labs  Lab 09/06/22 1516 09/06/22 1919 09/06/22 2116 09/11/22 2222 09/12/22 0235 09/12/22 0841 09/12/22 1423 09/12/22 1453  NA 133* 131* 132* 136 136 136 132* 132*  K 6.5* 6.9* 5.4* 5.9* 7.0* 5.9* 6.1* 5.8*  CL 98  --  95* 99 98 97* 96*  --   CO2 16*  --  18* 18* 18* 17* 16*  --   GLUCOSE 142*  --  90 67* 162* 99 148*  --   BUN 97*  --  102* 106* 108* 108* 109*  --   CREATININE 12.18*  --  12.25* 13.04* 12.99* 12.34* 13.06*  --   ALBUMIN 3.3*  --   --  3.1*  --   --   --   --   CALCIUM 7.5*  --  7.4* 7.3* 7.3* 7.7* 7.3*  --   AST 20  --   --  40  --   --   --   --   ALT 21  --   --  32  --   --   --   --    Liver Function Tests: Recent Labs  Lab 09/06/22 1516 09/11/22 2222  AST 20 40  ALT 21 32  ALKPHOS 90 95  BILITOT 0.4 0.9  PROT 7.1 6.3*  ALBUMIN 3.3* 3.1*   Recent Labs  Lab 09/06/22 1516  LIPASE 33   No results for input(s): "AMMONIA" in the last 168 hours. CBC: Recent Labs  Lab 09/06/22 1516 09/06/22 1919 09/11/22 2222 09/12/22 0841 09/12/22 1453  WBC 5.8  --  6.2 6.6  --    NEUTROABS 4.8  --   --   --   --   HGB 11.2*   < > 10.6* 10.7* 9.9*  HCT 34.0*   < > 33.8* 32.6* 29.0*  MCV 96.9  --  100.6* 96.4  --   PLT 337  --  266 255  --    < > = values in this interval not displayed.   Cardiac Enzymes: No results for input(s): "CKTOTAL", "CKMB", "CKMBINDEX", "TROPONINI" in the last 168 hours. CBG: Recent Labs  Lab 09/12/22 1425 09/12/22 1607 09/12/22 2106 09/13/22 0537 09/13/22 0815  GLUCAP 169* 174* 335* 369* 252*    Iron Studies: No results for input(s): "IRON", "TIBC", "TRANSFERRIN", "FERRITIN" in the last 72 hours. Studies/Results: DG Abdomen 1 View  Result Date: 09/11/2022 CLINICAL DATA:  Stomach distention, concern for bowel obstruction. EXAM: ABDOMEN - 1 VIEW COMPARISON:  05/20/2021.  FINDINGS: The bowel gas pattern is normal. No radio-opaque calculi. A peritoneal dialysis catheter is noted with tip coiling in the pelvis. The heart is enlarged. IMPRESSION: 1. No bowel obstruction. 2. Peritoneal dialysis catheter terminating in the pelvis. 3. Cardiomegaly. Electronically Signed   By: Brett Fairy M.D.   On: 09/11/2022 23:50   DG Chest 2 View  Result Date: 09/11/2022 CLINICAL DATA:  Chest pain, dizziness EXAM: CHEST - 2 VIEW COMPARISON:  08/24/2022 FINDINGS: Frontal and lateral views of the chest demonstrates stable enlargement of the cardiac silhouette. No airspace disease, effusion, or pneumothorax. No acute bony abnormality. IMPRESSION: 1. Stable enlarged cardiac silhouette. 2. No acute airspace disease. Electronically Signed   By: Randa Ngo M.D.   On: 09/11/2022 22:12    amitriptyline  50 mg Oral QHS   atorvastatin  40 mg Oral Daily   Chlorhexidine Gluconate Cloth  6 each Topical Daily   gentamicin cream  1 Application Topical Daily   heparin  5,000 Units Subcutaneous Q8H   insulin aspart  0-15 Units Subcutaneous TID WC   insulin aspart  0-6 Units Subcutaneous TID WC   insulin aspart  2 Units Subcutaneous TID WC   insulin detemir  5 Units  Subcutaneous QHS   pantoprazole (PROTONIX) IV  40 mg Intravenous Q12H   sodium zirconium cyclosilicate  10 g Oral TID    BMET    Component Value Date/Time   NA 132 (L) 09/12/2022 1453   K 5.8 (H) 09/12/2022 1453   CL 96 (L) 09/12/2022 1423   CO2 16 (L) 09/12/2022 1423   GLUCOSE 148 (H) 09/12/2022 1423   BUN 109 (H) 09/12/2022 1423   CREATININE 13.06 (H) 09/12/2022 1423   CALCIUM 7.3 (L) 09/12/2022 1423   GFRNONAA 4 (L) 09/12/2022 1423   GFRAA 52 (L) 10/28/2019 1933   CBC    Component Value Date/Time   WBC 6.6 09/12/2022 0841   RBC 3.38 (L) 09/12/2022 0841   HGB 9.9 (L) 09/12/2022 1453   HCT 29.0 (L) 09/12/2022 1453   HCT 23.0 (L) 10/18/2019 0534   PLT 255 09/12/2022 0841   MCV 96.4 09/12/2022 0841   MCH 31.7 09/12/2022 0841   MCHC 32.8 09/12/2022 0841   RDW 15.7 (H) 09/12/2022 0841   LYMPHSABS 0.8 09/06/2022 1516   MONOABS 0.3 09/06/2022 1516   EOSABS 0.0 09/06/2022 1516   BASOSABS 0.0 09/06/2022 1516    Dialysis Orders:  Primary nephrologist: Lufadeju  CCPD 4 exchanges 1500 fill volume 1:45 dwell time No daytime dwell  Patient reports dry weight 117 llbs.    Assessment/Plan: Hyperkalemia - secondary to missed dialysis. Has received temporizing agents in the ED. Continue Loklema. Continue CCPD here.   Repeat labs pending for this morning. ESRD -  As above. Continue CCPD.  She may be failing PD since she had a recent admission to [facility] and required CRRT.  Plan was to place [facility] and transition to IHD, however she left AMA and did not go for [facility].  She does not want to switch modalities at this time and thinks that CCPD is going "fine".  Will continue to follow her electrolytes and volume status.   Nausea/vomiting - per primary team but markedly improved. Hypertension/volume  - Hypotensive in ED and was admitted to ICU.  BP's have improved and should be ok to transfer to the floor.  Acute on chronic systolic CHF with NICM - EF 13%, mod MR, severe TR.  Seen by advanced heart  failure team who  recommended possible transfer to [facility] if treatment unresponsive. Anemia  - Hb at goal. No ESA needs  Metabolic bone disease -  Corr Ca ok. Continue home meds.  DMT1 - insulin  per primary   Donetta Potts, MD [facility]

## 2022-09-13 NOTE — Progress Notes (Signed)
Patient requesting pain medication for 8/10 back pain causing nausea. Gave PRN tylenol and nausea medicine. Will use heat therapy. Patient would like something stronger after tylenol didn't help much. Provider notified.

## 2022-09-13 NOTE — Progress Notes (Signed)
Insulin pump removed by pt and placed into her pocket book at bedside.  Insulin monitor remains in place on RUE.  Pt does not want to remove monitor at this time.

## 2022-09-13 NOTE — Progress Notes (Signed)
Inpatient Diabetes Program Recommendations  AACE/ADA: New Consensus Statement on Inpatient Glycemic Control (2015)  Target Ranges:  Prepandial:   less than 140 mg/dL      Peak postprandial:   less than 180 mg/dL (1-2 hours)      Critically ill patients:  140 - 180 mg/dL   Lab Results  Component Value Date   GLUCAP 176 (H) 09/13/2022   HGBA1C 8.6 (H) 06/27/2022    Review of Glycemic Control  Latest Reference Range & Units 09/13/22 05:37 09/13/22 08:15 09/13/22 11:25  Glucose-Capillary 70 - 99 mg/dL 369 (H) 252 (H) 176 (H)   Diabetes history: DM 1/ESRD Outpatient Diabetes medications: Omnipod insulin pump with dexcom CGM  -0.5 units/hour (total of 12 units/day), insulin to carb ratio is 1:8 grams carbs, and insulin sensitivity is 1:50 mg/dl (1 unit drops glucose 50 mg/dl).    Current orders for Inpatient glycemic control:  Levemir 5 units q HS Novolog 2 units tid with meals Novolog moderate (0-15 units) tid with meals  Inpatient Diabetes Program Recommendations:    Consider increasing Levemir to 10 units q HS and reduce Novolog correction to "very sensitive" 0-6 units tid with meals (since meal coverage added today).  Will follow.   Thanks,  Adah Perl, RN, BC-ADM Inpatient Diabetes Coordinator Pager [phone]  (8a-5p)

## 2022-09-13 NOTE — Progress Notes (Signed)
Report called to [facility]. Patient with no complaints at the current time. Will transfer via [place].

## 2022-09-13 NOTE — Progress Notes (Signed)
Pt arrived from [facility] via wheelchair to [facility] room 01. Received report from [place]. Pt A&O x4, IV x2, on room air. Weight obtained, vitals stable, CHG bath performed. Pt oriented to unit and room. Call bell given to pt.

## 2022-09-14 ENCOUNTER — Inpatient Hospital Stay (HOSPITAL_COMMUNITY): Payer: Medicare Other

## 2022-09-14 DIAGNOSIS — N186 End stage renal disease: Secondary | ICD-10-CM | POA: Diagnosis not present

## 2022-09-14 DIAGNOSIS — E875 Hyperkalemia: Secondary | ICD-10-CM | POA: Diagnosis not present

## 2022-09-14 DIAGNOSIS — I5023 Acute on chronic systolic (congestive) heart failure: Secondary | ICD-10-CM | POA: Diagnosis not present

## 2022-09-14 HISTORY — PX: IR FLUORO GUIDE CV LINE RIGHT: IMG2283

## 2022-09-14 HISTORY — PX: IR US GUIDE VASC ACCESS RIGHT: IMG2390

## 2022-09-14 LAB — GLUCOSE, CAPILLARY
Glucose-Capillary: 477 mg/dL — ABNORMAL HIGH (ref 70–99)
Glucose-Capillary: 297 mg/dL — ABNORMAL HIGH (ref 70–99)
Glucose-Capillary: 176 mg/dL — ABNORMAL HIGH (ref 70–99)
Glucose-Capillary: 48 mg/dL — ABNORMAL LOW (ref 70–99)
Glucose-Capillary: 166 mg/dL — ABNORMAL HIGH (ref 70–99)
Glucose-Capillary: 99 mg/dL (ref 70–99)
Glucose-Capillary: 133 mg/dL — ABNORMAL HIGH (ref 70–99)
Glucose-Capillary: 144 mg/dL — ABNORMAL HIGH (ref 70–99)

## 2022-09-14 LAB — BASIC METABOLIC PANEL
Anion gap: 21 — ABNORMAL HIGH (ref 5–15)
BUN: 102 mg/dL — ABNORMAL HIGH (ref 6–20)
CO2: 18 mmol/L — ABNORMAL LOW (ref 22–32)
Calcium: 6.8 mg/dL — ABNORMAL LOW (ref 8.9–10.3)
Chloride: 95 mmol/L — ABNORMAL LOW (ref 98–111)
Creatinine, Ser: 13.07 mg/dL — ABNORMAL HIGH (ref 0.44–1.00)
GFR, Estimated: 4 mL/min — ABNORMAL LOW (ref >60)
Glucose, Bld: 108 mg/dL — ABNORMAL HIGH (ref 70–99)
Potassium: 3.8 mmol/L (ref 3.5–5.1)
Sodium: 134 mmol/L — ABNORMAL LOW (ref 135–145)

## 2022-09-14 LAB — RENAL FUNCTION PANEL
Albumin: 2.6 g/dL — ABNORMAL LOW (ref 3.5–5.0)
Anion gap: 23 — ABNORMAL HIGH (ref 5–15)
BUN: 106 mg/dL — ABNORMAL HIGH (ref 6–20)
CO2: 15 mmol/L — ABNORMAL LOW (ref 22–32)
Calcium: 6.9 mg/dL — ABNORMAL LOW (ref 8.9–10.3)
Chloride: 92 mmol/L — ABNORMAL LOW (ref 98–111)
Creatinine, Ser: 12.88 mg/dL — ABNORMAL HIGH (ref 0.44–1.00)
GFR, Estimated: 4 mL/min — ABNORMAL LOW (ref >60)
Glucose, Bld: 396 mg/dL — ABNORMAL HIGH (ref 70–99)
Phosphorus: 9.4 mg/dL — ABNORMAL HIGH (ref 2.5–4.6)
Potassium: 5.4 mmol/L — ABNORMAL HIGH (ref 3.5–5.1)
Sodium: 130 mmol/L — ABNORMAL LOW (ref 135–145)

## 2022-09-14 LAB — HEPATITIS B SURFACE ANTIGEN: Hepatitis B Surface Ag: NONREACTIVE

## 2022-09-14 LAB — HEPATITIS B CORE ANTIBODY, TOTAL: Hep B Core Total Ab: NONREACTIVE

## 2022-09-14 MED ORDER — MIDAZOLAM HCL 2 MG/2ML IJ SOLN
INTRAMUSCULAR | Status: AC
  Filled 2022-09-14: qty 2

## 2022-09-14 MED ORDER — LIDOCAINE HCL 1 % IJ SOLN
INTRAMUSCULAR | Status: AC
  Administered 2022-09-14: 10 mL
  Filled 2022-09-14: qty 20

## 2022-09-14 MED ORDER — CEFAZOLIN SODIUM-DEXTROSE 2-4 GM/100ML-% IV SOLN: 2.0000 g | INTRAVENOUS | Status: DC

## 2022-09-14 MED ORDER — INSULIN DETEMIR 100 UNIT/ML ~~LOC~~ SOLN
10.0000 [IU] | Freq: Every day | SUBCUTANEOUS | Status: DC
  Administered 2022-09-14: 10 [IU] via SUBCUTANEOUS
  Filled 2022-09-14: qty 0.1

## 2022-09-14 MED ORDER — INSULIN ASPART 100 UNIT/ML IJ SOLN
8.0000 [IU] | Freq: Once | INTRAMUSCULAR | Status: AC
  Administered 2022-09-14: 8 [IU] via SUBCUTANEOUS

## 2022-09-14 MED ORDER — CEFAZOLIN SODIUM-DEXTROSE 2-4 GM/100ML-% IV SOLN
INTRAVENOUS | Status: AC
  Filled 2022-09-14: qty 100

## 2022-09-14 MED ORDER — DEXTROSE 50 % IV SOLN
25.0000 g | INTRAVENOUS | Status: AC
  Administered 2022-09-14: 25 g via INTRAVENOUS
  Administered 2022-09-15: 12.5 g via INTRAVENOUS

## 2022-09-14 MED ORDER — FENTANYL CITRATE (PF) 100 MCG/2ML IJ SOLN
INTRAMUSCULAR | Status: AC | PRN
  Administered 2022-09-14: 25 ug via INTRAVENOUS

## 2022-09-14 MED ORDER — CHLORHEXIDINE GLUCONATE CLOTH 2 % EX PADS
6.0000 | MEDICATED_PAD | Freq: Every day | CUTANEOUS | Status: DC
  Administered 2022-09-14: 6 via TOPICAL

## 2022-09-14 MED ORDER — CEFAZOLIN SODIUM-DEXTROSE 2-4 GM/100ML-% IV SOLN: 2.0000 g | INTRAVENOUS | Status: AC

## 2022-09-14 MED ORDER — MIDAZOLAM HCL 2 MG/2ML IJ SOLN
INTRAMUSCULAR | Status: AC | PRN
  Administered 2022-09-14 (×2): .5 mg via INTRAVENOUS

## 2022-09-14 MED ORDER — GELATIN ABSORBABLE 12-7 MM EX MISC
CUTANEOUS | Status: AC
  Filled 2022-09-14: qty 1

## 2022-09-14 MED ORDER — INSULIN ASPART 100 UNIT/ML IJ SOLN: 5.0000 [IU] | Freq: Three times a day (TID) | INTRAMUSCULAR | Status: DC

## 2022-09-14 MED ORDER — FENTANYL CITRATE (PF) 100 MCG/2ML IJ SOLN
INTRAMUSCULAR | Status: AC
  Filled 2022-09-14: qty 2

## 2022-09-14 MED ORDER — HEPARIN SODIUM (PORCINE) 1000 UNIT/ML IJ SOLN
INTRAMUSCULAR | Status: AC
  Filled 2022-09-14: qty 4

## 2022-09-14 MED ORDER — DEXTROSE 50 % IV SOLN
INTRAVENOUS | Status: AC
  Filled 2022-09-14: qty 50

## 2022-09-14 MED ORDER — CEFAZOLIN SODIUM-DEXTROSE 2-4 GM/100ML-% IV SOLN
INTRAVENOUS | Status: AC | PRN
  Administered 2022-09-14: 2 g via INTRAVENOUS

## 2022-09-14 MED ORDER — PANTOPRAZOLE SODIUM 40 MG PO TBEC
40.0000 mg | DELAYED_RELEASE_TABLET | Freq: Two times a day (BID) | ORAL | Status: DC
  Administered 2022-09-15 (×3): 40 mg via ORAL
  Filled 2022-09-14 (×4): qty 1

## 2022-09-14 MED ORDER — INSULIN ASPART 100 UNIT/ML IJ SOLN: 0.0000 [IU] | Freq: Every day | INTRAMUSCULAR | Status: DC

## 2022-09-14 MED ORDER — INSULIN ASPART 100 UNIT/ML IJ SOLN
3.0000 [IU] | Freq: Three times a day (TID) | INTRAMUSCULAR | Status: DC
  Administered 2022-09-14 – 2022-09-15 (×3): 3 [IU] via SUBCUTANEOUS

## 2022-09-14 MED ORDER — INSULIN DETEMIR 100 UNIT/ML ~~LOC~~ SOLN
15.0000 [IU] | Freq: Every day | SUBCUTANEOUS | Status: DC
  Filled 2022-09-14: qty 0.15

## 2022-09-14 MED ORDER — HEPARIN SODIUM (PORCINE) 1000 UNIT/ML IJ SOLN
INTRAMUSCULAR | Status: AC
  Administered 2022-09-14: 3.2 mL
  Filled 2022-09-14: qty 10

## 2022-09-14 MED ORDER — INSULIN ASPART 100 UNIT/ML IJ SOLN
0.0000 [IU] | Freq: Three times a day (TID) | INTRAMUSCULAR | Status: DC
  Administered 2022-09-14 – 2022-09-15 (×2): 1 [IU] via SUBCUTANEOUS
  Administered 2022-09-15: 5 [IU] via SUBCUTANEOUS
  Administered 2022-09-16: 7 [IU] via SUBCUTANEOUS

## 2022-09-14 MED ORDER — SENNOSIDES-DOCUSATE SODIUM 8.6-50 MG PO TABS
1.0000 | ORAL_TABLET | Freq: Two times a day (BID) | ORAL | Status: DC
  Administered 2022-09-15 (×2): 1 via ORAL
  Filled 2022-09-14 (×3): qty 1

## 2022-09-14 MED ORDER — OXYCODONE HCL 5 MG PO TABS
5.0000 mg | ORAL_TABLET | Freq: Four times a day (QID) | ORAL | Status: DC | PRN
  Administered 2022-09-14 – 2022-09-16 (×5): 5 mg via ORAL
  Filled 2022-09-14 (×5): qty 1

## 2022-09-14 NOTE — Progress Notes (Signed)
PROGRESS NOTE    Kathy Lewis  ZDG:[id number] DOB: 03/13/1996 DOA: 09/11/2022 PCP: [place].  26/F with history of severe nonischemic cardiomyopathy, EF 13-20%, ESRD on peritoneal dialysis, type 1 diabetes mellitus, history of gastroparesis, bipolar disorder recently hospitalized with volume overload required CRRT with dobutamine and milrinone support, hemodialysis was recommended however patient left AMA and continued peritoneal dialysis. -Back in the ER 1/15 with nausea vomiting, dizziness hypoglycemia, in the ER she was hypertensive, potassium of 5.9, metabolic acidosis with significantly elevated BUN and creatinine. -In the ER became progressively hypotensive and was transferred to ICU -1/17 BP stabilized, tolerated PD -1/18 transferred to [facility] service   Subjective: -Feels better, had PD yesterday  Assessment and Plan:  Acute on chronic systolic CHF -Still appears volume overloaded -Known and ICM, EF of 13-20% -Coreg and hydralazine on hold, CHF team following -I think she would be better served by hemodialysis however patient is not keen to transition from PD as noted on prior admission as well -Would benefit from follow-up at [facility] for kidney heart transplant, compliance may be a concern  ESRD, with volume overload, metabolic acidosis, hyperkalemia -Suboptimal peritoneal dialysis -K5.4 this morning with bicarb of 15, BUN 110, creatinine close to 13 -Nephrology following, followed by Dr.Lufadeju in [place] keen to continue PD for now  Cyclical nausea and vomiting History of diabetic gastroparesis -Reglan on hold, QTc was prolonged -Supportive care, PPI  Type 1 diabetes mellitus with hyperglycemia -Increase Levemir and meal coverage dose  DVT prophylaxis: Heparin subcutaneous Code Status: Full code Family Communication: None present Disposition Plan: Home likely 1 to 2 days  Consultants: PCCM transfer, CHF team, nephrology   Procedures:    Antimicrobials:    Objective: Vitals:   09/14/22 0113 09/14/22 0640 09/14/22 0725 09/14/22 0807  BP:  102/74  112/83  Pulse:  96  99  Resp:  18  18  Temp:  98.7 F (37.1 C)    TempSrc:  Oral    SpO2: 99% 99%  99%  Weight:   59.1 kg   Height:        Intake/Output Summary (Last 24 hours) at 09/14/2022 0933 Last data filed at 09/14/2022 0846 Gross per 24 hour  Intake 1190 ml  Output 794 ml  Net 396 ml   Filed Weights   09/12/22 1600 09/13/22 0500 09/14/22 0725  Weight: 59.3 kg 60.6 kg 59.1 kg    Examination:  General exam: Chronically ill young female sitting up in bed, AAOx3, no distress HEENT: Positive JVD CVS: S1-S2, regular rhythm Lungs: Decreased breath sounds at the bases Abdomen: Soft, obese, nontender, PD catheter noted Extremities: No edema  Psychiatry:  Mood & affect appropriate.     Data Reviewed:   CBC: Recent Labs  Lab 09/11/22 2222 09/12/22 0841 09/12/22 1453 09/13/22 1032  WBC 6.2 6.6  --  3.7*  HGB 10.6* 10.7* 9.9* 9.5*  HCT 33.8* 32.6* 29.0* 28.2*  MCV 100.6* 96.4  --  95.6  PLT 266 255  --  595   Basic Metabolic Panel: Recent Labs  Lab 09/12/22 0008 09/12/22 0235 09/12/22 0841 09/12/22 1423 09/12/22 1453 09/13/22 1032 09/14/22 0043 09/14/22 0825  NA  --    < > 136 132* 132* 131* 130* 134*  K  --    < > 5.9* 6.1* 5.8* 5.2* 5.4* 3.8  CL  --    < > 97* 96*  --  94* 92* 95*  CO2  --    < >  17* 16*  --  18* 15* 18*  GLUCOSE  --    < > 99 148*  --  188* 396* 108*  BUN  --    < > 108* 109*  --  107* 106* 102*  CREATININE  --    < > 12.34* 13.06*  --  12.71* 12.88* 13.07*  CALCIUM  --    < > 7.7* 7.3*  --  6.4* 6.9* 6.8*  MG 2.0  --   --   --   --   --   --   --   PHOS  --   --   --   --   --  9.9* 9.4*  --    < > = values in this interval not displayed.   GFR: Estimated Creatinine Clearance: 5.2 mL/min (A) (by C-G formula based on SCr of 13.07 mg/dL (H)). Liver Function Tests: Recent Labs  Lab 09/11/22 2222 09/13/22 1032  09/14/22 0043  AST 40  --   --   ALT 32  --   --   ALKPHOS 95  --   --   BILITOT 0.9  --   --   PROT 6.3*  --   --   ALBUMIN 3.1* 2.4* 2.6*   No results for input(s): "LIPASE", "AMYLASE" in the last 168 hours. No results for input(s): "AMMONIA" in the last 168 hours. Coagulation Profile: No results for input(s): "INR", "PROTIME" in the last 168 hours. Cardiac Enzymes: No results for input(s): "CKTOTAL", "CKMB", "CKMBINDEX", "TROPONINI" in the last 168 hours. BNP (last 3 results) No results for input(s): "PROBNP" in the last 8760 hours. HbA1C: No results for input(s): "HGBA1C" in the last 72 hours. CBG: Recent Labs  Lab 09/13/22 1731 09/13/22 2147 09/14/22 0126 09/14/22 0507 09/14/22 0640  GLUCAP 100* 295* 477* 297* 176*   Lipid Profile: No results for input(s): "CHOL", "HDL", "LDLCALC", "TRIG", "CHOLHDL", "LDLDIRECT" in the last 72 hours. Thyroid Function Tests: No results for input(s): "TSH", "T4TOTAL", "FREET4", "T3FREE", "THYROIDAB" in the last 72 hours. Anemia Panel: No results for input(s): "VITAMINB12", "FOLATE", "FERRITIN", "TIBC", "IRON", "RETICCTPCT" in the last 72 hours. Urine analysis:    Component Value Date/Time   COLORURINE YELLOW 07/28/2022 1503   APPEARANCEUR CLEAR 07/28/2022 1503   LABSPEC 1.025 07/28/2022 1503   PHURINE 5.5 07/28/2022 1503   GLUCOSEU NEGATIVE 07/28/2022 1503   HGBUR MODERATE (A) 07/28/2022 1503   BILIRUBINUR NEGATIVE 07/28/2022 1503   KETONESUR NEGATIVE 07/28/2022 1503   PROTEINUR >=300 (A) 07/28/2022 1503   UROBILINOGEN 0.2 12/15/2015 1736   NITRITE NEGATIVE 07/28/2022 1503   LEUKOCYTESUR NEGATIVE 07/28/2022 1503   Sepsis Labs: @LABRCNTIP (procalcitonin:4,lacticidven:4)  ) Recent Results (from the past 240 hour(s))  Resp panel by RT-PCR (RSV, Flu A&B, Covid) Anterior Nasal Swab     Status: None   Collection Time: 09/11/22 11:15 PM   Specimen: Anterior Nasal Swab  Result Value Ref Range Status   SARS Coronavirus 2 by RT PCR  NEGATIVE NEGATIVE Final    Comment: (NOTE) SARS-CoV-2 target nucleic acids are NOT DETECTED.  The SARS-CoV-2 RNA is generally detectable in upper respiratory specimens during the acute phase of infection. The lowest concentration of SARS-CoV-2 viral copies this assay can detect is 138 copies/mL. A negative result does not preclude SARS-Cov-2 infection and should not be used as the sole basis for treatment or other patient management decisions. A negative result may occur with  improper specimen collection/handling, submission of specimen other than nasopharyngeal swab, presence of viral mutation(s)  within the areas targeted by this assay, and inadequate number of viral copies(<138 copies/mL). A negative result must be combined with clinical observations, patient history, and epidemiological information. The expected result is Negative.  Fact Sheet for Patients:  EntrepreneurPulse.com.au  Fact Sheet for Healthcare Providers:  IncredibleEmployment.be  This test is no t yet approved or cleared by the [place] FDA and  has been authorized for detection and/or diagnosis of SARS-CoV-2 by FDA under an Emergency Use Authorization (EUA). This EUA will remain  in effect (meaning this test can be used) for the duration of the COVID-19 declaration under Section 564(b)(1) of the Act, 21 U.S.C.section 360bbb-3(b)(1), unless the authorization is terminated  or revoked sooner.       Influenza A by PCR NEGATIVE NEGATIVE Final   Influenza B by PCR NEGATIVE NEGATIVE Final    Comment: (NOTE) The Xpert Xpress SARS-CoV-2/FLU/RSV plus assay is intended as an aid in the diagnosis of influenza from Nasopharyngeal swab specimens and should not be used as a sole basis for treatment. Nasal washings and aspirates are unacceptable for Xpert Xpress SARS-CoV-2/FLU/RSV testing.  Fact Sheet for Patients: EntrepreneurPulse.com.au  Fact Sheet for  Healthcare Providers: IncredibleEmployment.be  This test is not yet approved or cleared by the [place] FDA and has been authorized for detection and/or diagnosis of SARS-CoV-2 by FDA under an Emergency Use Authorization (EUA). This EUA will remain in effect (meaning this test can be used) for the duration of the COVID-19 declaration under Section 564(b)(1) of the Act, 21 U.S.C. section 360bbb-3(b)(1), unless the authorization is terminated or revoked.     Resp Syncytial Virus by PCR NEGATIVE NEGATIVE Final    Comment: (NOTE) Fact Sheet for Patients: EntrepreneurPulse.com.au  Fact Sheet for Healthcare Providers: IncredibleEmployment.be  This test is not yet approved or cleared by the [place] FDA and has been authorized for detection and/or diagnosis of SARS-CoV-2 by FDA under an Emergency Use Authorization (EUA). This EUA will remain in effect (meaning this test can be used) for the duration of the COVID-19 declaration under Section 564(b)(1) of the Act, 21 U.S.C. section 360bbb-3(b)(1), unless the authorization is terminated or revoked.  Performed at [facility], [place] [address], [place], [place] [address]      Radiology Studies: No results found.   Scheduled Meds:  amitriptyline  50 mg Oral QHS   atorvastatin  40 mg Oral Daily   calcium acetate  1,334 mg Oral TID WC   Chlorhexidine Gluconate Cloth  6 each Topical Daily   clonazePAM  0.5 mg Oral QHS   gentamicin cream  1 Application Topical Daily   heparin  5,000 Units Subcutaneous Q8H   insulin aspart  0-6 Units Subcutaneous TID WC   insulin aspart  5 Units Subcutaneous TID WC   insulin detemir  15 Units Subcutaneous QHS   pantoprazole (PROTONIX) IV  40 mg Intravenous Q12H   senna-docusate  1 tablet Oral BID   sodium zirconium cyclosilicate  10 g Oral TID   Continuous Infusions:  dialysis solution 1.5% low-MG/low-CA Stopped (09/12/22 1044)      LOS: 2 days    Time spent: 22min    Domenic Polite, MD [facility]   09/14/2022, 9:33 AM

## 2022-09-14 NOTE — Telephone Encounter (Signed)
a 

## 2022-09-14 NOTE — Progress Notes (Signed)
Patient ID: Kathy Lewis, female   DOB: September 10, 1995, 27 y.o.   MRN: [id number] S: Pt having nausea with dry heaves this am. O:BP 112/83 (BP Location: Right Arm)   Pulse 99   Temp 98.7 F (37.1 C) (Oral)   Resp 18   Ht 5' (1.524 m)   Wt 59.1 kg   SpO2 99%   BMI 25.43 kg/m   Intake/Output Summary (Last 24 hours) at 09/14/2022 0945 Last data filed at 09/14/2022 0846 Gross per 24 hour  Intake 1190 ml  Output 794 ml  Net 396 ml   Intake/Output: I/O last 3 completed shifts: In: 1430 [P.O.:1380; IV Piggyback:50] Out: 19 [Urine:1; Other:16; Stool:2]  Intake/Output this shift:  Total I/O In: 240 [P.O.:240] Out: 793 [Other:793] Weight change:  Gen: NAD CVS: RRR Resp: CTA Abd: _BS, distended, nontender Ext: no edema  Recent Labs  Lab 09/11/22 2222 09/12/22 0235 09/12/22 0841 09/12/22 1423 09/12/22 1453 09/13/22 1032 09/14/22 0043 09/14/22 0825  NA 136 136 136 132* 132* 131* 130* 134*  K 5.9* 7.0* 5.9* 6.1* 5.8* 5.2* 5.4* 3.8  CL 99 98 97* 96*  --  94* 92* 95*  CO2 18* 18* 17* 16*  --  18* 15* 18*  GLUCOSE 67* 162* 99 148*  --  188* 396* 108*  BUN 106* 108* 108* 109*  --  107* 106* 102*  CREATININE 13.04* 12.99* 12.34* 13.06*  --  12.71* 12.88* 13.07*  ALBUMIN 3.1*  --   --   --   --  2.4* 2.6*  --   CALCIUM 7.3* 7.3* 7.7* 7.3*  --  6.4* 6.9* 6.8*  PHOS  --   --   --   --   --  9.9* 9.4*  --   AST 40  --   --   --   --   --   --   --   ALT 32  --   --   --   --   --   --   --    Liver Function Tests: Recent Labs  Lab 09/11/22 2222 09/13/22 1032 09/14/22 0043  AST 40  --   --   ALT 32  --   --   ALKPHOS 95  --   --   BILITOT 0.9  --   --   PROT 6.3*  --   --   ALBUMIN 3.1* 2.4* 2.6*   No results for input(s): "LIPASE", "AMYLASE" in the last 168 hours. No results for input(s): "AMMONIA" in the last 168 hours. CBC: Recent Labs  Lab 09/11/22 2222 09/12/22 0841 09/12/22 1453 09/13/22 1032  WBC 6.2 6.6  --  3.7*  HGB 10.6* 10.7* 9.9* 9.5*  HCT 33.8* 32.6*  29.0* 28.2*  MCV 100.6* 96.4  --  95.6  PLT 266 255  --  205   Cardiac Enzymes: No results for input(s): "CKTOTAL", "CKMB", "CKMBINDEX", "TROPONINI" in the last 168 hours. CBG: Recent Labs  Lab 09/13/22 1731 09/13/22 2147 09/14/22 0126 09/14/22 0507 09/14/22 0640  GLUCAP 100* 295* 477* 297* 176*    Iron Studies: No results for input(s): "IRON", "TIBC", "TRANSFERRIN", "FERRITIN" in the last 72 hours. Studies/Results: No results found.  amitriptyline  50 mg Oral QHS   atorvastatin  40 mg Oral Daily   calcium acetate  1,334 mg Oral TID WC   Chlorhexidine Gluconate Cloth  6 each Topical Daily   clonazePAM  0.5 mg Oral QHS   gentamicin cream  1 Application Topical Daily  heparin  5,000 Units Subcutaneous Q8H   insulin aspart  0-6 Units Subcutaneous TID WC   insulin aspart  5 Units Subcutaneous TID WC   insulin detemir  15 Units Subcutaneous QHS   pantoprazole (PROTONIX) IV  40 mg Intravenous Q12H   senna-docusate  1 tablet Oral BID   sodium zirconium cyclosilicate  10 g Oral TID    BMET    Component Value Date/Time   NA 134 (L) 09/14/2022 0825   K 3.8 09/14/2022 0825   CL 95 (L) 09/14/2022 0825   CO2 18 (L) 09/14/2022 0825   GLUCOSE 108 (H) 09/14/2022 0825   BUN 102 (H) 09/14/2022 0825   CREATININE 13.07 (H) 09/14/2022 0825   CALCIUM 6.8 (L) 09/14/2022 0825   GFRNONAA 4 (L) 09/14/2022 0825   GFRAA 52 (L) 10/28/2019 1933   CBC    Component Value Date/Time   WBC 3.7 (L) 09/13/2022 1032   RBC 2.95 (L) 09/13/2022 1032   HGB 9.5 (L) 09/13/2022 1032   HCT 28.2 (L) 09/13/2022 1032   HCT 23.0 (L) 10/18/2019 0534   PLT 205 09/13/2022 1032   MCV 95.6 09/13/2022 1032   MCH 32.2 09/13/2022 1032   MCHC 33.7 09/13/2022 1032   RDW 15.6 (H) 09/13/2022 1032   LYMPHSABS 0.8 09/06/2022 1516   MONOABS 0.3 09/06/2022 1516   EOSABS 0.0 09/06/2022 1516   BASOSABS 0.0 09/06/2022 1516    Dialysis Orders:  Primary nephrologist: Lufadeju  CCPD 4 exchanges 1500 fill volume 1:45  dwell time No daytime dwell  Patient reports dry weight 117 llbs.    Assessment/Plan: Hyperkalemia - secondary to missed dialysis. Has received temporizing agents in the ED. Continue Loklema. Continue CCPD here.   Repeat labs pending for this morning. ESRD -  Her BUN/Cr have not improved at all despite 3 days of CCPD.  She is failing clearances and likely PD.  I discussed with her that she had required CRRT in the past and that the current prescription of CCPD is not adequate.  She is now willing to transition to IHD if her heart can tolerate it.  Will consult IR for [facility] placement and start HD afterwards.  We will continue to follow her electrolytes and volume status.   Nausea/vomiting - likely due to uremia and failed PD.  Plan to initiate IHD as above Hypertension/volume  - Hypotensive in ED and was admitted to ICU.  BP's have improved but will need to see how she responds to IHD.  Acute on chronic systolic CHF with NICM - EF 13%, mod MR, severe TR.  Seen by advanced heart failure team who recommended possible transfer to [facility] for heart-kidney transplant if treatment unresponsive. Anemia  - Hb at goal. No ESA needs  Metabolic bone disease -  Corr Ca ok. Continue home meds.  DMT1 - insulin  per primary   Donetta Potts, MD [facility]

## 2022-09-14 NOTE — Progress Notes (Signed)
[facility] Progress Note Patient Name: Kathy Lewis DOB: 20-May-1996 MRN: [id number]   Date of Service  09/14/2022  HPI/Events of Note  CBG 477 mg / dl, verified with a random blood sugar of 396 mg / dl measured contemporaneously, Novolog 8 units SQ x 1 ordered with hourly CBG measurements x 6 hours.  eICU Interventions  See above.        [name] Corvette Orser 09/14/2022, 2:33 AM

## 2022-09-14 NOTE — Progress Notes (Addendum)
Advanced Heart Failure Rounding Note  PCP-Cardiologist: None   Subjective:     Had PD last night.    Feels better. Wants to go home.     Objective:   Weight Range: 59.1 kg Body mass index is 25.43 kg/m.   Vital Signs:   Temp:  [98.6 F (37 C)-99.8 F (37.7 C)] 98.7 F (37.1 C) (01/18 0640) Pulse Rate:  [85-100] 96 (01/18 0640) Resp:  [17-24] 18 (01/18 0640) BP: (78-120)/(54-94) 102/74 (01/18 0640) SpO2:  [92 %-100 %] 99 % (01/18 0640) Weight:  [59.1 kg] 59.1 kg (01/18 0725) Last BM Date : 09/13/22  Weight change: Filed Weights   09/12/22 1600 09/13/22 0500 09/14/22 0725  Weight: 59.3 kg 60.6 kg 59.1 kg    Intake/Output:   Intake/Output Summary (Last 24 hours) at 09/14/2022 0755 Last data filed at 09/14/2022 0710 Gross per 24 hour  Intake 950 ml  Output 794 ml  Net 156 ml      Physical Exam   General:   No resp difficulty HEENT: normal Neck: supple. JVP 7-8 . Carotids 2+ bilat; no bruits. No lymphadenopathy or thryomegaly appreciated. Cor: PMI nondisplaced. Regular rate & rhythm. No rubs or murmurs. +S3 Lungs: clear Abdomen: soft, nontender, nondistended. No hepatosplenomegaly. No bruits or masses. Good bowel sounds. Extremities: no cyanosis, clubbing, rash, edema Neuro: alert & orientedx3, cranial nerves grossly intact. moves all 4 extremities w/o difficulty. Affect pleasant  Telemetry   SR 80-90s   Labs    CBC Recent Labs    09/12/22 0841 09/12/22 1453 09/13/22 1032  WBC 6.6  --  3.7*  HGB 10.7* 9.9* 9.5*  HCT 32.6* 29.0* 28.2*  MCV 96.4  --  95.6  PLT 255  --  528   Basic Metabolic Panel Recent Labs    09/12/22 0008 09/12/22 0235 09/13/22 1032 09/14/22 0043  NA  --    < > 131* 130*  K  --    < > 5.2* 5.4*  CL  --    < > 94* 92*  CO2  --    < > 18* 15*  GLUCOSE  --    < > 188* 396*  BUN  --    < > 107* 106*  CREATININE  --    < > 12.71* 12.88*  CALCIUM  --    < > 6.4* 6.9*  MG 2.0  --   --   --   PHOS  --   --  9.9*  9.4*   < > = values in this interval not displayed.   Liver Function Tests Recent Labs    09/11/22 2222 09/13/22 1032 09/14/22 0043  AST 40  --   --   ALT 32  --   --   ALKPHOS 95  --   --   BILITOT 0.9  --   --   PROT 6.3*  --   --   ALBUMIN 3.1* 2.4* 2.6*   No results for input(s): "LIPASE", "AMYLASE" in the last 72 hours. Cardiac Enzymes No results for input(s): "CKTOTAL", "CKMB", "CKMBINDEX", "TROPONINI" in the last 72 hours.  BNP: BNP (last 3 results) Recent Labs    11/22/21 1027 06/27/22 1730 09/11/22 2222  BNP 2,727.2* >4,500.0* 2,413.8*    ProBNP (last 3 results) No results for input(s): "PROBNP" in the last 8760 hours.   D-Dimer No results for input(s): "DDIMER" in the last 72 hours. Hemoglobin A1C No results for input(s): "HGBA1C" in the last 72 hours. Fasting  Lipid Panel No results for input(s): "CHOL", "HDL", "LDLCALC", "TRIG", "CHOLHDL", "LDLDIRECT" in the last 72 hours. Thyroid Function Tests No results for input(s): "TSH", "T4TOTAL", "T3FREE", "THYROIDAB" in the last 72 hours.  Invalid input(s): "FREET3"  Other results:   Imaging    No results found.   Medications:     Scheduled Medications:  amitriptyline  50 mg Oral QHS   atorvastatin  40 mg Oral Daily   calcium acetate  1,334 mg Oral TID WC   Chlorhexidine Gluconate Cloth  6 each Topical Daily   clonazePAM  0.5 mg Oral QHS   gentamicin cream  1 Application Topical Daily   heparin  5,000 Units Subcutaneous Q8H   insulin aspart  0-6 Units Subcutaneous TID WC   insulin aspart  2 Units Subcutaneous TID WC   insulin detemir  10 Units Subcutaneous QHS   pantoprazole (PROTONIX) IV  40 mg Intravenous Q12H   sodium zirconium cyclosilicate  10 g Oral TID    Infusions:  dialysis solution 1.5% low-MG/low-CA Stopped (09/12/22 1044)    PRN Medications: acetaminophen, heparin sodium (porcine) 3,000 Units in dialysis solution 1.5% low-MG/low-CA 6,000 mL dialysis solution, LORazepam,  metoCLOPramide, prochlorperazine    Patient Profile   27 y/o woman with DM1, ESRD on PD and presumed peripartum CM dating back to 2021 with severe LV dysfunction. Has been followed at [facility] AHF team but work-up complicated by lack of f/o and compliance issues.    Recently admitted to [facility] and underwent RHC with markedly elevated filling pressures and low output. Started on inotropes. Also underwent CVVHD for several days but refused conversion to PD. Left AMA.    Now readmitted with n/v, ab pain wit recurrent fluid overload. AHF asked to see to assist with HF management.  Assessment/Plan    1. 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 [place] 07/29/20:  CO low-normal by TD (CO/CI) Thermodilution: 4.07/2.59) and normal cardiac output by Fick (CO/CI Fick: 4.7/3.0) ECHOs 21' suggested evidence of possible non-compaction, but MRI 51' showed no evidence of infarction, myocarditis, or infiltrative patterns. TTE on 07/30/20 with LVEF 35-40% but declined to 25-30% around 04/2021 likely in the setting of noncompliance with medical therapies.  - Echo 10/2021: EF of 20%. Small pericardial effusion. Indeterminate diastolic function   - Echo 08/14/22: EF 13%, mod MR, severe TR, PASP ~50, small effusion - RHC at [facility] 12/23: RA mean, PA mean 56, PCWP 39, PA sat 56%, Fick CI 1.7, Thermo CI 1.9 - Has been followed by AHF team at [facility] but management c/b noncompliance.  - Admitted with volume overload. Had PD last night. Volume status improving.  BP looks ok.  Can use DBA as needed to support BP and permit volume removal with PD - Considering transfer to [facility] this admit for heart-kidney transplant.   2. End stage renal disease on PD - PD per Nephrology. If ineffective, will need CVVHD - Had PD last night. Weight down a couple pounds.  - Previously declined TDC in an effort to switch to iHD.    Hyperkalemia - K 7 on admission, received lokelma - 5.4 today.    4. DMI - per  primary - Hgb A1c 8.6 12/23 - On SSI.    5. Anemia of chronic disease - No CBC today.     Length of Stay: 2  Darrick Grinder, NP  09/14/2022, 7:55 AM  Advanced Heart Failure Team Pager [phone] (M-F; 7a - 5p)  Please contact [facility] for  night-coverage after hours (5p -7a ) and weekends on [email or web address].  Patient seen and examined with the above-signed Advanced Practice Provider and/or Housestaff. I personally reviewed laboratory data, imaging studies and relevant notes. I independently examined the patient and formulated the important aspects of the plan. I have edited the note to reflect any of my changes or salient points. I have personally discussed the plan with the patient and/or family.  Tolerated PD again well overnight but weight still up 10 pounds from baseline. Does not appear to be watching intake closely. CBG 477 overnight now coming down.   Denies CP or SOB. Nauseated. BUN remains high   General:  Sitting up in bed No resp difficulty HEENT: normal Neck: supple. JVP to jaw Carotids 2+ bilat; no bruits. No lymphadenopathy or thryomegaly appreciated. Cor: PMI nondisplaced. Regular rate & rhythm. + s3 Lungs: clear Abdomen: soft, nontender, nondistended. No hepatosplenomegaly. No bruits or masses. Good bowel sounds. + PD cath Extremities: no cyanosis, clubbing, rash, 1+ edema Neuro: alert & orientedx3, cranial nerves grossly intact. moves all 4 extremities w/o difficulty. Affect pleasant  Has now fialed PD with persistent uremia. Per Renal will switch to HD. Suspect she may have trouble tolerating. If unable to tolerate iHD will need transfer to [facility] to consider candidacy for heart-kidney transplant  BP too soft to add back GDMT at this time.  Glori Bickers, MD  9:37 AM

## 2022-09-14 NOTE — Progress Notes (Signed)
Patient says that her blood sugar was high. Reading was 477 when checked, provider notified.

## 2022-09-14 NOTE — Plan of Care (Signed)
  Problem: Coping: Goal: Ability to adjust to condition or change in health will improve Outcome: Progressing   Problem: Fluid Volume: Goal: Ability to maintain a balanced intake and output will improve Outcome: Progressing   Problem: Health Behavior/Discharge Planning: Goal: Ability to manage health-related needs will improve Outcome: Progressing   Problem: Metabolic: Goal: Ability to maintain appropriate glucose levels will improve Outcome: Progressing   Problem: Education: Goal: Knowledge of General Education information will improve Description: Including pain rating scale, medication(s)/side effects and non-pharmacologic comfort measures Outcome: Progressing

## 2022-09-14 NOTE — Consult Note (Signed)
Chief Complaint: Patient was seen in consultation today for ESRD at the request of Nephrology  Referring Physician(s): Donato Heinz, MD  Supervising Physician: Corrie Mckusick  Patient Status: Kathy Lewis - In-pt  History of Present Illness: Kathy Lewis is a 27 y.o. female with chronic HFrEF, end-stage renal disease on peritoneal dialysis, type 1 diabetes and gastroparesis here with acute on chronic HFrEF. Her BUN/Cr have not improved on CCPD during admission and HD is now recommended.  IR consulted for placement of tunneled dialysis catheter. She has been experiencing N/V today thought to be secondary to uremia and failed PD.  Her last oral intake was 8 oz of juice around 9:30am which was promptly vomited.    Past Medical History:  Diagnosis Date   Anemia    Anxiety    Bipolar 2 disorder (HCC)    Chronic kidney disease    Chronic systolic (congestive) heart failure (HCC)    Depression    DKA (diabetic ketoacidoses)    ESRD on peritoneal dialysis (Kathy Lewis)    HSV infection    on valtrex   Hypokalemia    Leukocytosis    Migraine    Noncompliance with medication regimen    Preeclampsia    Prolonged QT syndrome    Severe anemia    Type 1 diabetes mellitus (Kathy Lewis)     Past Surgical History:  Procedure Laterality Date   BIOPSY  04/24/2022   Procedure: BIOPSY;  Surgeon: [name], MD;  Location: [facility];  Service: Gastroenterology;;   CARDIAC CATHETERIZATION     COLONOSCOPY WITH PROPOFOL N/A 04/24/2022   Procedure: COLONOSCOPY WITH PROPOFOL;  Surgeon: [name], MD;  Location: [facility];  Service: Gastroenterology;  Laterality: N/A;   DILATION AND EVACUATION N/A 10/22/2019   Procedure: ULTRASOUND GUIDED DILATATION AND EVACUATION;  Surgeon: [name], MD;  Location: [facility];  Service: Gynecology;  Laterality: N/A;   ESOPHAGOGASTRODUODENOSCOPY (EGD) WITH PROPOFOL N/A 04/24/2022   Procedure: ESOPHAGOGASTRODUODENOSCOPY (EGD) WITH PROPOFOL;  Surgeon:  [name], MD;  Location: [facility];  Service: Gastroenterology;  Laterality: N/A;   NO PAST SURGERIES     peritoneal dialysis catheter insertion     RENAL BIOPSY      Allergies: Cantaloupe extract allergy skin test, Strawberry extract, Citrullus vulgaris, Food, and Nsaids  Medications: Prior to Admission medications   Medication Sig Start Date End Date Taking? Authorizing Provider  acetaminophen (TYLENOL) 500 MG tablet Take 1,000 mg by mouth every 6 (six) hours as needed for headache (pain).   Yes [provider]  amitriptyline (ELAVIL) 50 MG tablet Take 1 tablet (50 mg total) by mouth at bedtime. 08/30/22  Yes Eulis Canner E, NP  atorvastatin (LIPITOR) 40 MG tablet Take 40 mg by mouth every evening.   Yes [provider]  calcitRIOL (ROCALTROL) 0.25 MCG capsule Take 0.25 mcg by mouth every evening. 08/03/21  Yes [provider]  calcium carbonate (TUMS - DOSED IN MG ELEMENTAL CALCIUM) 500 MG chewable tablet Take 2 tablets by mouth 3 (three) times daily with meals.   Yes [provider]  carvedilol (COREG) 12.5 MG tablet Take 12.5 mg by mouth 2 (two) times daily with a meal.   Yes [provider]  clonazePAM (KLONOPIN) 0.5 MG tablet Take 1 tablet (0.5 mg total) by mouth 2 (two) times daily as needed for anxiety. 03/16/22  Yes [name], MD  hydrALAZINE (APRESOLINE) 50 MG tablet Take 50 mg by mouth 3 (three) times daily. 08/23/22  Yes  [provider]  hydrOXYzine (ATARAX) 10 MG tablet Take 1 tablet (10 mg total) by mouth 3 (three) times daily as needed. Patient taking differently: Take 10 mg by mouth 3 (three) times daily as needed for itching. 08/30/22  Yes Eulis Canner E, NP  insulin lispro (HUMALOG) 100 UNIT/ML injection Inject 0.5 Units into the skin See admin instructions. 0.5 units per hour via insulin pump. 01/08/22  Yes [provider]  isosorbide mononitrate (IMDUR) 30 MG 24 hr tablet Take 30 mg by  mouth in the morning.   Yes [provider]  LORazepam (ATIVAN) 1 MG tablet Take 0.5 tablets (0.5 mg total) by mouth 3 (three) times daily as needed. Patient taking differently: Take 0.5 mg by mouth 3 (three) times daily as needed for anxiety. 08/11/22  Yes Mesner, Corene Cornea, MD  magnesium oxide (MAG-OX) 400 MG tablet Take 800 mg by mouth daily.   Yes [provider]  methocarbamol (ROBAXIN) 500 MG tablet Take 1 tablet (500 mg total) by mouth 2 (two) times daily as needed for up to 15 doses for muscle spasms. 09/06/22  Yes Wyvonnia Dusky, MD  metoCLOPramide (REGLAN) 10 MG tablet Take 10 mg by mouth 4 (four) times daily as needed for nausea or vomiting. 08/15/22  Yes [provider]  ondansetron (ZOFRAN) 4 MG tablet Take 1 tablet (4 mg total) by mouth every 6 (six) hours. Patient taking differently: Take 4 mg by mouth every 6 (six) hours as needed for nausea or vomiting. 04/11/22  Yes [name], PA  pantoprazole (PROTONIX) 40 MG tablet Take 1 tablet (40 mg total) by mouth 2 (two) times daily before a meal. Patient taking differently: Take 40 mg by mouth daily as needed (heartburn). 08/11/22  Yes Mesner, Corene Cornea, MD  Vitamin D, Ergocalciferol, (DRISDOL) 1.25 MG (50000 UNIT) CAPS capsule Take 50,000 Units by mouth every Sunday.   Yes [provider]  escitalopram (LEXAPRO) 10 MG tablet Take 20 mg by mouth daily.  05/10/20 10/06/20  [provider]  furosemide (LASIX) 40 MG tablet Take 1 tablet (40 mg total) by mouth daily. Patient not taking: Reported on 02/16/2021 10/28/19 02/17/21  [name], MD  lisinopril (ZESTRIL) 10 MG tablet Take 10 mg by mouth daily.  02/23/20 10/06/20  [provider]  spironolactone (ALDACTONE) 25 MG tablet Take 25 mg by mouth daily.  04/20/20 10/06/20  [provider]     Family History  Adopted: Yes  Problem Relation Age of Onset   Heart disease Neg Hx     Social History   Socioeconomic History    Marital status: Single    Spouse name: Not on file   Number of children: 1   Years of education: Not on file   Highest education level: Not on file  Occupational History   Occupation: unemployed   Occupation: n/a  Tobacco Use   Smoking status: Former    Packs/day: 1.00    Years: 2.00    Total pack years: 2.00    Types: Cigars, Cigarettes   Smokeless tobacco: Never  Vaping Use   Vaping Use: Never used  Substance and Sexual Activity   Alcohol use: No   Drug use: No   Sexual activity: Yes    Birth control/protection: None  Other Topics Concern   Not on file  Social History Narrative   Not on file   Social Determinants of Health   Financial Resource Strain: Not on file  Food Insecurity: No Food Insecurity (09/13/2022)  Hunger Vital Sign    Worried About Running Out of Food in the Last Year: Never true    Ran Out of Food in the Last Year: Never true  Transportation Needs: No Transportation Needs (09/13/2022)   PRAPARE - Hydrologist (Medical): No    Lack of Transportation (Non-Medical): No  Physical Activity: Not on file  Stress: Not on file  Social Connections: Not on file    Review of Systems: A 12 point ROS discussed and pertinent positives are indicated in the HPI above.  All other systems are negative.  Vital Signs: BP (!) 120/102 (BP Location: Right Arm)   Pulse (!) 102   Temp 98.7 F (37.1 C) (Oral)   Resp 16   Ht 5' (1.524 m)   Wt 130 lb 3.2 oz (59.1 kg)   SpO2 98%   BMI 25.43 kg/m   Physical Exam Constitutional:      Comments: Chronically ill appearing  Neck:     Vascular: JVD present.  Cardiovascular:     Rate and Rhythm: Tachycardia present.     Pulses: Normal pulses.     Heart sounds: Normal heart sounds.  Pulmonary:     Effort: Pulmonary effort is normal. No respiratory distress.  Abdominal:     Palpations: Abdomen is soft.     Tenderness: There is no abdominal tenderness.     Comments: PD catheter present   Musculoskeletal:     Right lower leg: No edema.     Left lower leg: No edema.  Skin:    General: Skin is warm and dry.  Neurological:     Mental Status: She is alert.  Psychiatric:        Mood and Affect: Mood normal.        Judgment: Judgment normal.     Imaging: DG Abdomen 1 View  Result Date: 09/11/2022 CLINICAL DATA:  Stomach distention, concern for bowel obstruction. EXAM: ABDOMEN - 1 VIEW COMPARISON:  05/20/2021. FINDINGS: The bowel gas pattern is normal. No radio-opaque calculi. A peritoneal dialysis catheter is noted with tip coiling in the pelvis. The heart is enlarged. IMPRESSION: 1. No bowel obstruction. 2. Peritoneal dialysis catheter terminating in the pelvis. 3. Cardiomegaly. Electronically Signed   By: Brett Fairy M.D.   On: 09/11/2022 23:50   DG Chest 2 View  Result Date: 09/11/2022 CLINICAL DATA:  Chest pain, dizziness EXAM: CHEST - 2 VIEW COMPARISON:  08/24/2022 FINDINGS: Frontal and lateral views of the chest demonstrates stable enlargement of the cardiac silhouette. No airspace disease, effusion, or pneumothorax. No acute bony abnormality. IMPRESSION: 1. Stable enlarged cardiac silhouette. 2. No acute airspace disease. Electronically Signed   By: Randa Ngo M.D.   On: 09/11/2022 22:12   DG Chest Port 1 View  Result Date: 08/24/2022 CLINICAL DATA:  Shortness of breath EXAM: PORTABLE CHEST 1 VIEW COMPARISON:  Radiograph 08/18/2022, 08/12/2022 FINDINGS: Unchanged cardiomegaly. There is increased density in the left lung base with loss of silhouette with a left hemidiaphragm. Right lung is clear. No evidence of pneumothorax. No definite pleural effusion. No acute osseous abnormality. IMPRESSION: Left basilar airspace disease which could be atelectasis or pneumonia. Unchanged cardiomegaly. Electronically Signed   By: Maurine Simmering M.D.   On: 08/24/2022 16:50    Labs:  CBC: Recent Labs    09/06/22 1516 09/06/22 1919 09/11/22 2222 09/12/22 0841 09/12/22 1453  09/13/22 1032  WBC 5.8  --  6.2 6.6  --  3.7*  HGB 11.2*   < >  10.6* 10.7* 9.9* 9.5*  HCT 34.0*   < > 33.8* 32.6* 29.0* 28.2*  PLT 337  --  266 255  --  205   < > = values in this interval not displayed.    BMP: Recent Labs    09/12/22 1423 09/12/22 1453 09/13/22 1032 09/14/22 0043 09/14/22 0825  NA 132* 132* 131* 130* 134*  K 6.1* 5.8* 5.2* 5.4* 3.8  CL 96*  --  94* 92* 95*  CO2 16*  --  18* 15* 18*  GLUCOSE 148*  --  188* 396* 108*  BUN 109*  --  107* 106* 102*  CALCIUM 7.3*  --  6.4* 6.9* 6.8*  CREATININE 13.06*  --  12.71* 12.88* 13.07*  GFRNONAA 4*  --  4* 4* 4*    LIVER FUNCTION TESTS: Recent Labs    08/12/22 1137 08/24/22 1607 09/06/22 1516 09/11/22 2222 09/13/22 1032 09/14/22 0043  BILITOT 0.6 0.6 0.4 0.9  --   --   AST 16 24 20  40  --   --   ALT 19 11 21  32  --   --   ALKPHOS 83 106 90 95  --   --   PROT 6.4* 7.0 7.1 6.3*  --   --   ALBUMIN 3.0* 3.4* 3.3* 3.1* 2.4* 2.6*    Assessment and Plan:  Ms. Zonie Crutcher is a pleasant, chronically ill appearing 27 year old female admitted with acute on chronic systolic heart failure.  It appears her peritoneal dialysis is not effective and IR has been asked to place a tunneled dialysis catheter to initiate hemodialysis.  As patient is not NPO x 6 hours, will need to wait until later this afternoon for placement or consider a temp cath.   Will bring down to IR as schedule allows and catheter will be ready for use immediately following.  Risks and benefits discussed with the patient including, but not limited to bleeding, infection, vascular injury, pneumothorax which may require chest tube placement, air embolism or even death  All of the patient's questions were answered, patient is agreeable to proceed. Consent signed and in chart.   Thank you for this interesting consult.  I greatly enjoyed meeting Ninette Cotta and look forward to participating in their care.  A copy of this report was sent to the requesting  provider on this date.  Electronically Signed: Pasty Spillers, PA 09/14/2022, 12:12 PM   I spent a total of 30 minutes in face to face in clinical consultation, greater than 50% of which was counseling/coordinating care for hemodialysis access.

## 2022-09-14 NOTE — Progress Notes (Signed)
[facility] Progress Note Patient Name: Kathy Lewis DOB: 1996-08-14 MRN: [id number]   Date of Service  09/14/2022  HPI/Events of Note  CBG 477 mg / dl which is a dramatic jump from recent measurements.  eICU Interventions  Stat Random Blood Sugar  to verify results prior to treating, given her serum creatinine of > 12.        [name] [name] 09/14/2022, 1:49 AM

## 2022-09-14 NOTE — Progress Notes (Signed)
Initial Nutrition Assessment  DOCUMENTATION CODES:   Not applicable  INTERVENTION:  Once diet resumes, recommend: Encouraged adequate PO intake Carnation Instant Breakfast po TID - each supplement provides 140 kcal and 5g of protein  Renal MVI with minerals daily  NUTRITION DIAGNOSIS:   Increased nutrient needs related to acute illness, catabolic illness as evidenced by estimated needs.  GOAL:   Patient will meet greater than or equal to 90% of their needs  MONITOR:   Diet advancement, Labs, Weight trends, Supplement acceptance  REASON FOR ASSESSMENT:   Malnutrition Screening Tool    ASSESSMENT:   Pt admitted with n/v, found to have acute on chronic CHF. PMH significant for ESRD on PD, nonischemic CM with EF 20%, T1DM, gastroparesis, HTN and cyclic nausea and vomiting. Recently admitted d/t volume overload requring CRRT, HD was recommended but pt left AMA. Food allergies: all melon, strawberry extract  Pt has continued with PD however BUN/Cr not improving. Nephrology planning for [facility] placement and initiation of iHD. Pt with n/v this morning which they suspect is d/t uremia and failed PD.   HF MD recommendation for possible transfer to [facility] for consideration of kidney heart transplant if unable to tolerate iHD.  Attempted to assess pt at bedside this morning. She appeared drowsy and states "my blood sugar is low." She has been wearing a Dexcom to continue checking her blood sugar. No family present but RN entered during visit. Pt states that she did not eat this morning as she does not usually eat breakfast at home. RN had provided apple juice for pt but this did not help. Orange juice was provided but pt began vomiting shortly after consuming. Noted D50 injection given to help with low blood sugar.  RN reports that pt has tried multiple nutrition supplements but does not enjoy any of them. Pt does endorse drinking milk. Once diet resumes and n/v improved, will place order for  carnation instant breakfast to see if these are better tolerated.   EDW: 117 lbs (53.2 kg) Pt has been on CCPD which was providing additional calories. However now that she is transitioning to [facility], will no longer be receiving additional calories from solution.   Medications: phoslo, SSI 0-5 units qhs, SSI 0-9 units TID, SSI 3 units TID, levemir 10 units qhs, senna, lokelma  Labs: sodium 130, potassium 5.4, BUN 106, Cr 12.88, corrected Ca 8.02, anion gap 23, phos 9.4, GFR 4, HgbA1c 8.6% (05/2022), CBG's 100-477 x24 hours  I/O's: +1145ml since admit  NUTRITION - FOCUSED PHYSICAL EXAM: *will need repeat NFPE upon follow up d/t inability to complete full exam Flowsheet Row Most Recent Value  Orbital Region No depletion  Upper Arm Region Unable to assess  Thoracic and Lumbar Region Unable to assess  Buccal Region No depletion  Temple Region No depletion  Clavicle Bone Region No depletion  Clavicle and Acromion Bone Region No depletion  Scapular Bone Region Unable to assess  Dorsal Hand Unable to assess  Patellar Region Unable to assess  Anterior Thigh Region Unable to assess  Posterior Calf Region Unable to assess  Edema (RD Assessment) None  Hair Reviewed  Eyes Reviewed  Mouth Unable to assess  Skin Reviewed  Nails Unable to assess       Diet Order:   Diet Order             Diet NPO time specified  Diet effective now  EDUCATION NEEDS:   Not appropriate for education at this time  Skin:  Skin Assessment: Reviewed RN Assessment  Last BM:  1/17  Height:   Ht Readings from Last 1 Encounters:  09/11/22 5' (1.524 m)    Weight:   Wt Readings from Last 1 Encounters:  09/14/22 59.1 kg   BMI:  Body mass index is 25.43 kg/m.  Estimated Nutritional Needs:   Kcal:  1600-1800  Protein:  80-95g  Fluid:  1L + UOP  Clayborne Dana, RDN, LDN Clinical Nutrition

## 2022-09-14 NOTE — Procedures (Signed)
Interventional Radiology Procedure Note  Procedure: Placement of a right IJ approach tunneled HD cath.  19cm tip to cuff.  Tip positioned at the superior cavoatrial junction and catheter is ready for immediate use.  Complications: None Recommendations:  - Ok to use - Do not submerge - Routine line care   Signed,  Dulcy Fanny. Earleen Newport, DO

## 2022-09-14 NOTE — Plan of Care (Signed)
Pt had hemodialysis cath placed today. Pt scheduled for dialysis tonight.  Problem: Education: Goal: Ability to describe self-care measures that may prevent or decrease complications (Diabetes Survival Skills Education) will improve Outcome: Progressing Goal: Individualized Educational Video(s) Outcome: Progressing   Problem: Coping: Goal: Ability to adjust to condition or change in health will improve Outcome: Progressing   Problem: Fluid Volume: Goal: Ability to maintain a balanced intake and output will improve Outcome: Progressing   Problem: Health Behavior/Discharge Planning: Goal: Ability to identify and utilize available resources and services will improve Outcome: Progressing Goal: Ability to manage health-related needs will improve Outcome: Progressing   Problem: Metabolic: Goal: Ability to maintain appropriate glucose levels will improve Outcome: Progressing   Problem: Nutritional: Goal: Maintenance of adequate nutrition will improve Outcome: Progressing Goal: Progress toward achieving an optimal weight will improve Outcome: Progressing   Problem: Skin Integrity: Goal: Risk for impaired skin integrity will decrease Outcome: Progressing   Problem: Tissue Perfusion: Goal: Adequacy of tissue perfusion will improve Outcome: Progressing   Problem: Education: Goal: Knowledge of General Education information will improve Description: Including pain rating scale, medication(s)/side effects and non-pharmacologic comfort measures Outcome: Progressing   Problem: Health Behavior/Discharge Planning: Goal: Ability to manage health-related needs will improve Outcome: Progressing   Problem: Clinical Measurements: Goal: Ability to maintain clinical measurements within normal limits will improve Outcome: Progressing Goal: Will remain free from infection Outcome: Progressing Goal: Diagnostic test results will improve Outcome: Progressing Goal: Respiratory complications  will improve Outcome: Progressing Goal: Cardiovascular complication will be avoided Outcome: Progressing   Problem: Activity: Goal: Risk for activity intolerance will decrease Outcome: Progressing   Problem: Nutrition: Goal: Adequate nutrition will be maintained Outcome: Progressing   Problem: Coping: Goal: Level of anxiety will decrease Outcome: Progressing   Problem: Elimination: Goal: Will not experience complications related to bowel motility Outcome: Progressing Goal: Will not experience complications related to urinary retention Outcome: Progressing   Problem: Pain Managment: Goal: General experience of comfort will improve Outcome: Progressing   Problem: Safety: Goal: Ability to remain free from injury will improve Outcome: Progressing   Problem: Skin Integrity: Goal: Risk for impaired skin integrity will decrease Outcome: Progressing

## 2022-09-14 NOTE — Progress Notes (Signed)
Inpatient Diabetes Program Recommendations  AACE/ADA: New Consensus Statement on Inpatient Glycemic Control (2015)  Target Ranges:  Prepandial:   less than 140 mg/dL      Peak postprandial:   less than 180 mg/dL (1-2 hours)      Critically ill patients:  140 - 180 mg/dL   Lab Results  Component Value Date   GLUCAP 48 (L) 09/14/2022   HGBA1C 8.6 (H) 06/27/2022    Review of Glycemic Control  Latest Reference Range & Units 09/13/22 08:15 09/13/22 11:25 09/13/22 15:52 09/13/22 17:31 09/13/22 21:47 09/14/22 01:26 09/14/22 05:07 09/14/22 06:40 09/14/22 09:55  Glucose-Capillary 70 - 99 mg/dL 252 (H) 176 (H) 109 (H) 100 (H) 295 (H) 477 (H) 297 (H) 176 (H) 48 (L)    Diabetes history: DM 1/ESRD Outpatient Diabetes medications: Omnipod insulin pump with dexcom CGM  -0.5 units/hour (total of 12 units/day), insulin to carb ratio is 1:8 grams carbs, and insulin sensitivity is 1:50 mg/dl (1 unit drops glucose 50 mg/dl).    Current orders for Inpatient glycemic control:  Levemir 15 units q HS Novolog 5 units tid with meals Novolog moderate 0-6 tid with meals  Inpatient Diabetes Program Recommendations:    Note: meal coverage not given when glucose was 100 at dinner yesterday. Pt glucose rebound up to 477, Novolog 8 units given (peritoneal dialysis also increases glucose at night), snack given at 0100.   -   Decrease Levemir back down to 10 units -   Increase frequency of Novolog correction scale to Q4 hours to help cover peritoneal dialysis at night (pt does not need more than the max scale offering of 6 units due to sensitivity) -    Decrease Novolog meal coverage back down to 3 units  Thanks,  Tama Headings RN, MSN, BC-ADM Inpatient Diabetes Coordinator Team Pager [phone] (8a-5p)

## 2022-09-14 NOTE — Significant Event (Incomplete)
Pt experiencing hypoglycermia via phone Dexcom app reading [facility] reading 48mg /dl, pt drank apple juice that was at beside at about 8am, then orange juice with Dietitian. NPO was placed for HD cath. Patient became nausea then vomiting gave Compazine 10mg  and 1/2 amp of 5%Dextrose. Blood sugar [facility] 166mg /dl

## 2022-09-14 NOTE — Progress Notes (Signed)
Patient has home meds in room, in belongings bag. She doesn't want to send them to pharmacy. Explained to patient about not taking home meds.

## 2022-09-15 DIAGNOSIS — N186 End stage renal disease: Secondary | ICD-10-CM | POA: Diagnosis not present

## 2022-09-15 DIAGNOSIS — I5023 Acute on chronic systolic (congestive) heart failure: Secondary | ICD-10-CM | POA: Diagnosis not present

## 2022-09-15 DIAGNOSIS — E875 Hyperkalemia: Secondary | ICD-10-CM | POA: Diagnosis not present

## 2022-09-15 LAB — GLUCOSE, CAPILLARY
Glucose-Capillary: 133 mg/dL — ABNORMAL HIGH (ref 70–99)
Glucose-Capillary: 44 mg/dL — CL (ref 70–99)
Glucose-Capillary: 84 mg/dL (ref 70–99)
Glucose-Capillary: 126 mg/dL — ABNORMAL HIGH (ref 70–99)
Glucose-Capillary: 268 mg/dL — ABNORMAL HIGH (ref 70–99)
Glucose-Capillary: 198 mg/dL — ABNORMAL HIGH (ref 70–99)

## 2022-09-15 LAB — RENAL FUNCTION PANEL
Albumin: 2.3 g/dL — ABNORMAL LOW (ref 3.5–5.0)
Anion gap: 15 (ref 5–15)
BUN: 68 mg/dL — ABNORMAL HIGH (ref 6–20)
CO2: 23 mmol/L (ref 22–32)
Calcium: 6.7 mg/dL — ABNORMAL LOW (ref 8.9–10.3)
Chloride: 95 mmol/L — ABNORMAL LOW (ref 98–111)
Creatinine, Ser: 9.88 mg/dL — ABNORMAL HIGH (ref 0.44–1.00)
GFR, Estimated: 5 mL/min — ABNORMAL LOW (ref >60)
Glucose, Bld: 125 mg/dL — ABNORMAL HIGH (ref 70–99)
Phosphorus: 6.9 mg/dL — ABNORMAL HIGH (ref 2.5–4.6)
Potassium: 4 mmol/L (ref 3.5–5.1)
Sodium: 133 mmol/L — ABNORMAL LOW (ref 135–145)

## 2022-09-15 LAB — IRON AND TIBC
Iron: 27 ug/dL — ABNORMAL LOW (ref 28–170)
Saturation Ratios: 12 % (ref 10.4–31.8)
TIBC: 234 ug/dL — ABNORMAL LOW (ref 250–450)
UIBC: 207 ug/dL

## 2022-09-15 LAB — RETICULOCYTES
Immature Retic Fract: 24.7 % — ABNORMAL HIGH (ref 2.3–15.9)
RBC.: 3.09 MIL/uL — ABNORMAL LOW (ref 3.87–5.11)
Retic Count, Absolute: 109.7 10*3/uL (ref 19.0–186.0)
Retic Ct Pct: 3.6 % — ABNORMAL HIGH (ref 0.4–3.1)

## 2022-09-15 LAB — CBC
HCT: 29.5 % — ABNORMAL LOW (ref 36.0–46.0)
Hemoglobin: 9.8 g/dL — ABNORMAL LOW (ref 12.0–15.0)
MCH: 31.9 pg (ref 26.0–34.0)
MCHC: 33.2 g/dL (ref 30.0–36.0)
MCV: 96.1 fL (ref 80.0–100.0)
Platelets: 179 10*3/uL (ref 150–400)
RBC: 3.07 MIL/uL — ABNORMAL LOW (ref 3.87–5.11)
RDW: 15.8 % — ABNORMAL HIGH (ref 11.5–15.5)
WBC: 2.7 10*3/uL — ABNORMAL LOW (ref 4.0–10.5)
nRBC: 0 % (ref 0.0–0.2)

## 2022-09-15 LAB — FERRITIN: Ferritin: 269 ng/mL (ref 11–307)

## 2022-09-15 LAB — FOLATE: Folate: 15.9 ng/mL (ref >5.9)

## 2022-09-15 LAB — VITAMIN B12: Vitamin B-12: 723 pg/mL (ref 180–914)

## 2022-09-15 MED ORDER — INSULIN DETEMIR 100 UNIT/ML ~~LOC~~ SOLN
9.0000 [IU] | Freq: Every day | SUBCUTANEOUS | Status: DC
  Administered 2022-09-16: 9 [IU] via SUBCUTANEOUS
  Filled 2022-09-15 (×2): qty 0.09

## 2022-09-15 MED ORDER — GUAIFENESIN-DM 100-10 MG/5ML PO SYRP
5.0000 mL | ORAL_SOLUTION | ORAL | Status: DC | PRN
  Administered 2022-09-15: 5 mL via ORAL
  Filled 2022-09-15: qty 5

## 2022-09-15 NOTE — Progress Notes (Signed)
CBG 44, MD notified, pt received 1/2 ampule of D50 and 120 ml of juice.  Pt has hx of type 1 DM and is on a insulin pump at home and she very brittle on her CBG's.  Will continue to monitor, Thanks Arvella Nigh RN.

## 2022-09-15 NOTE — Progress Notes (Signed)
Patient ID: Kathy Lewis, female   DOB: 11-04-1995, 27 y.o.   MRN: [id number] S: Feeling much better today and tolerated HD well O:BP (!) 129/106   Pulse (!) 105   Temp 98.9 F (37.2 C) (Oral)   Resp 17   Ht 5' (1.524 m)   Wt 59.8 kg   SpO2 99%   BMI 25.74 kg/m   Intake/Output Summary (Last 24 hours) at 09/15/2022 0940 Last data filed at 09/15/2022 0742 Gross per 24 hour  Intake 700 ml  Output 1500 ml  Net -800 ml   Intake/Output: I/O last 3 completed shifts: In: 6387 [P.O.:1320; IV Piggyback:100] Out: 2293 [Urine:500; FIEPP:2951]  Intake/Output this shift:  Total I/O In: 120 [P.O.:120] Out: -  Weight change:  Gen: NAD CVS: RRR Resp: CTA Abd: +BS, soft, NT/ND Ext: no edema  Recent Labs  Lab 09/11/22 2222 09/12/22 0235 09/12/22 0841 09/12/22 1423 09/12/22 1453 09/13/22 1032 09/14/22 0043 09/14/22 0825 09/15/22 0542  NA 136 136 136 132* 132* 131* 130* 134* 133*  K 5.9* 7.0* 5.9* 6.1* 5.8* 5.2* 5.4* 3.8 4.0  CL 99 98 97* 96*  --  94* 92* 95* 95*  CO2 18* 18* 17* 16*  --  18* 15* 18* 23  GLUCOSE 67* 162* 99 148*  --  188* 396* 108* 125*  BUN 106* 108* 108* 109*  --  107* 106* 102* 68*  CREATININE 13.04* 12.99* 12.34* 13.06*  --  12.71* 12.88* 13.07* 9.88*  ALBUMIN 3.1*  --   --   --   --  2.4* 2.6*  --  2.3*  CALCIUM 7.3* 7.3* 7.7* 7.3*  --  6.4* 6.9* 6.8* 6.7*  PHOS  --   --   --   --   --  9.9* 9.4*  --  6.9*  AST 40  --   --   --   --   --   --   --   --   ALT 32  --   --   --   --   --   --   --   --    Liver Function Tests: Recent Labs  Lab 09/11/22 2222 09/13/22 1032 09/14/22 0043 09/15/22 0542  AST 40  --   --   --   ALT 32  --   --   --   ALKPHOS 95  --   --   --   BILITOT 0.9  --   --   --   PROT 6.3*  --   --   --   ALBUMIN 3.1* 2.4* 2.6* 2.3*   No results for input(s): "LIPASE", "AMYLASE" in the last 168 hours. No results for input(s): "AMMONIA" in the last 168 hours. CBC: Recent Labs  Lab 09/11/22 2222 09/12/22 0841 09/12/22 1453  09/13/22 1032 09/15/22 0542  WBC 6.2 6.6  --  3.7* 2.7*  HGB 10.6* 10.7* 9.9* 9.5* 9.8*  HCT 33.8* 32.6* 29.0* 28.2* 29.5*  MCV 100.6* 96.4  --  95.6 96.1  PLT 266 255  --  205 179   Cardiac Enzymes: No results for input(s): "CKTOTAL", "CKMB", "CKMBINDEX", "TROPONINI" in the last 168 hours. CBG: Recent Labs  Lab 09/14/22 1600 09/14/22 2044 09/15/22 0125 09/15/22 0514 09/15/22 0547  GLUCAP 133* 144* 133* 44* 84    Iron Studies: No results for input(s): "IRON", "TIBC", "TRANSFERRIN", "FERRITIN" in the last 72 hours. Studies/Results: IR Fluoro Guide CV Line Right  Result Date: 09/14/2022 INDICATION: 27 year old for hemodialysis catheter EXAM:  TUNNELED CENTRAL VENOUS HEMODIALYSIS CATHETER PLACEMENT WITH ULTRASOUND AND FLUOROSCOPIC GUIDANCE MEDICATIONS: 2 g Ancef. The antibiotic was given in an appropriate time interval prior to skin puncture. ANESTHESIA/SEDATION: Moderate (conscious) sedation was employed during this procedure. A total of Versed 1.0 mg and Fentanyl 25 mcg was administered intravenously. Moderate Sedation Time: 14 minutes. The patient's level of consciousness and vital signs were monitored continuously by radiology nursing throughout the procedure under my direct supervision. FLUOROSCOPY TIME:  Fluoroscopy Time: 0 minutes 6 seconds (1 mGy). COMPLICATIONS: None PROCEDURE: Informed written consent was obtained from the patient after a discussion of the risks, benefits, and alternatives to treatment. Questions regarding the procedure were encouraged and answered. The right neck and chest were prepped with chlorhexidine in a sterile fashion, and a sterile drape was applied covering the operative field. Maximum barrier sterile technique with sterile gowns and gloves were used for the procedure. A timeout was performed prior to the initiation of the procedure. Ultrasound survey was performed. The right internal jugular vein was confirmed to be patent, with images stored and sent to  PACS. Micropuncture kit was utilized to access the right internal jugular vein under direct, real-time ultrasound guidance after the overlying soft tissues were anesthetized with 1% lidocaine with epinephrine. Stab incision was made with 11 blade scalpel. Microwire was passed centrally. The microwire was then marked to measure appropriate internal catheter length. External tunneled length was estimated. A total tip to cuff length of 19 cm was selected. 035 guidewire was advanced to the level of the IVC. Skin and subcutaneous tissues of chest wall below the clavicle were generously infiltrated with 1% lidocaine for local anesthesia. A small stab incision was made with 11 blade scalpel. The selected hemodialysis catheter was tunneled in a retrograde fashion from the anterior chest wall to the venotomy incision. Serial dilation was performed and then a peel-away sheath was placed. The catheter was then placed through the peel-away sheath with tips ultimately positioned within the superior aspect of the right atrium. Final catheter positioning was confirmed and documented with a spot radiographic image. The catheter aspirates and flushes normally. The catheter was flushed with appropriate volume heparin dwells. The catheter exit site was secured with a 0-Prolene retention suture. Gel-Foam slurry was infused into the soft tissue tract. The venotomy incision was closed Derma bond and sterile dressing. Dressings were applied at the chest wall. Patient tolerated the procedure well and remained hemodynamically stable throughout. No complications were encountered and no significant blood loss encountered. IMPRESSION: Status post right IJ tunneled hemodialysis catheter. Signed, Dulcy Fanny. Nadene Rubins, RPVI [facility] Radiology Electronically Signed   By: Corrie Mckusick D.O.   On: 09/14/2022 17:24   IR US Guide Vasc Access Right  Result Date: 09/14/2022 INDICATION:  27 year old for hemodialysis catheter EXAM: TUNNELED CENTRAL VENOUS HEMODIALYSIS CATHETER PLACEMENT WITH ULTRASOUND AND FLUOROSCOPIC GUIDANCE MEDICATIONS: 2 g Ancef. The antibiotic was given in an appropriate time interval prior to skin puncture. ANESTHESIA/SEDATION: Moderate (conscious) sedation was employed during this procedure. A total of Versed 1.0 mg and Fentanyl 25 mcg was administered intravenously. Moderate Sedation Time: 14 minutes. The patient's level of consciousness and vital signs were monitored continuously by radiology nursing throughout the procedure under my direct supervision. FLUOROSCOPY TIME:  Fluoroscopy Time: 0 minutes 6 seconds (1 mGy). COMPLICATIONS: None PROCEDURE: Informed written consent was obtained from the patient after a discussion of the risks, benefits, and alternatives to treatment. Questions regarding the procedure were encouraged and answered.  The right neck and chest were prepped with chlorhexidine in a sterile fashion, and a sterile drape was applied covering the operative field. Maximum barrier sterile technique with sterile gowns and gloves were used for the procedure. A timeout was performed prior to the initiation of the procedure. Ultrasound survey was performed. The right internal jugular vein was confirmed to be patent, with images stored and sent to PACS. Micropuncture kit was utilized to access the right internal jugular vein under direct, real-time ultrasound guidance after the overlying soft tissues were anesthetized with 1% lidocaine with epinephrine. Stab incision was made with 11 blade scalpel. Microwire was passed centrally. The microwire was then marked to measure appropriate internal catheter length. External tunneled length was estimated. A total tip to cuff length of 19 cm was selected. 035 guidewire was advanced to the level of the IVC. Skin and subcutaneous tissues of chest wall below the clavicle were generously infiltrated with 1% lidocaine for local  anesthesia. A small stab incision was made with 11 blade scalpel. The selected hemodialysis catheter was tunneled in a retrograde fashion from the anterior chest wall to the venotomy incision. Serial dilation was performed and then a peel-away sheath was placed. The catheter was then placed through the peel-away sheath with tips ultimately positioned within the superior aspect of the right atrium. Final catheter positioning was confirmed and documented with a spot radiographic image. The catheter aspirates and flushes normally. The catheter was flushed with appropriate volume heparin dwells. The catheter exit site was secured with a 0-Prolene retention suture. Gel-Foam slurry was infused into the soft tissue tract. The venotomy incision was closed Derma bond and sterile dressing. Dressings were applied at the chest wall. Patient tolerated the procedure well and remained hemodynamically stable throughout. No complications were encountered and no significant blood loss encountered. IMPRESSION: Status post right IJ tunneled hemodialysis catheter. Signed, Dulcy Fanny. Nadene Rubins, RPVI [facility] Radiology Electronically Signed   By: Corrie Mckusick D.O.   On: 09/14/2022 17:24    amitriptyline  50 mg Oral QHS   atorvastatin  40 mg Oral Daily   calcium acetate  1,334 mg Oral TID WC   Chlorhexidine Gluconate Cloth  6 each Topical Daily   Chlorhexidine Gluconate Cloth  6 each Topical Q0600   clonazePAM  0.5 mg Oral QHS   gentamicin cream  1 Application Topical Daily   heparin  5,000 Units Subcutaneous Q8H   heparin sodium (porcine)       insulin aspart  0-5 Units Subcutaneous QHS   insulin aspart  0-9 Units Subcutaneous TID WC   insulin aspart  3 Units Subcutaneous TID WC   insulin detemir  9 Units Subcutaneous QHS   pantoprazole  40 mg Oral BID   senna-docusate  1 tablet Oral BID    BMET    Component Value Date/Time   NA 133 (L) 09/15/2022 0542   K 4.0  09/15/2022 0542   CL 95 (L) 09/15/2022 0542   CO2 23 09/15/2022 0542   GLUCOSE 125 (H) 09/15/2022 0542   BUN 68 (H) 09/15/2022 0542   CREATININE 9.88 (H) 09/15/2022 0542   CALCIUM 6.7 (L) 09/15/2022 0542   GFRNONAA 5 (L) 09/15/2022 0542   GFRAA 52 (L) 10/28/2019 1933   CBC    Component Value Date/Time   WBC 2.7 (L) 09/15/2022 0542   RBC 3.07 (L) 09/15/2022 0542   HGB 9.8 (L) 09/15/2022 0542   HCT 29.5 (L) 09/15/2022 0542  HCT 23.0 (L) 10/18/2019 0534   PLT 179 09/15/2022 0542   MCV 96.1 09/15/2022 0542   MCH 31.9 09/15/2022 0542   MCHC 33.2 09/15/2022 0542   RDW 15.8 (H) 09/15/2022 0542   LYMPHSABS 0.8 09/06/2022 1516   MONOABS 0.3 09/06/2022 1516   EOSABS 0.0 09/06/2022 1516   BASOSABS 0.0 09/06/2022 1516    Dialysis Orders:  Primary nephrologist: Lufadeju  CCPD 4 exchanges 1500 fill volume 1:45 dwell time No daytime dwell  Patient reports dry weight 117 llbs.    Assessment/Plan: Hyperkalemia - secondary to missed dialysis. Has received temporizing agents in the ED. Continue Loklema. Continue CCPD here.   Repeat labs pending for this morning. ESRD -  Failed CCPD and transitioned to IHD on 09/14/22.  She tolerated HD well with marked improvement in BUN/Cr.  She has a spot at [facility] on TTS schedule.  Will plan on HD tomorrow morning and she can be discharged afterwards and follow up on Tuesday at 6:05 am at [facility].  RIJ TDC placed 09/14/22. Nausea/vomiting - likely due to uremia and failed PD.  Plan to initiate IHD as above Hypertension/volume  - Hypotensive in ED and was admitted to ICU.  BP's have improved but will need to see how she responds to IHD.  Acute on chronic systolic CHF with NICM - EF 13%, mod MR, severe TR.  Seen by advanced heart failure team who arranged for outpatient appointment at [facility] for heart-kidney transplant if treatment unresponsive. Anemia  - Hb at goal. No ESA needs  Metabolic bone disease -  Corr Ca ok. Continue home meds.  DMT1 - insulin  per  primary   Donetta Potts, MD [facility]

## 2022-09-15 NOTE — Progress Notes (Signed)
PROGRESS NOTE    Kathy Lewis  ZDG:[id number] DOB: [birth year] DOA: 09/11/2022 PCP: [place].  27/F with history of severe nonischemic cardiomyopathy, EF 13-20%, ESRD on peritoneal dialysis, type 1 diabetes mellitus, history of gastroparesis, bipolar disorder recently hospitalized with volume overload required CRRT with dobutamine and milrinone support, hemodialysis was recommended however patient left AMA and continued peritoneal dialysis. -Back in the ER 1/15 with nausea vomiting, dizziness hypoglycemia, in the ER she was hypertensive, potassium of 5.9, metabolic acidosis with significantly elevated BUN and creatinine. -In the ER became progressively hypotensive and was transferred to ICU -1/17 BP stabilized, tolerated PD -1/18 transferred to [facility] service -1/18, tunneled dialysis catheter placed, started on HD   Subjective: -Feels better today, had her first HD treatment yesterday afternoon  Assessment and Plan:  Acute on chronic systolic CHF -Known NICM, EF of 13-20% -Coreg and hydralazine on hold, CHF team following -Now started on HD 1/18, GDMT limited -Would benefit from follow-up at [facility] for kidney heart transplant, compliance may be a concern  ESRD, with volume overload, metabolic acidosis, hyperkalemia -Suboptimal peritoneal dialysis -Nephrology following, followed by Dr.Lufadeju in [facility] -Now improving after transition to HD yesterday, tunneled dialysis catheter placed in IR, continue hemodialysis, clip for outpatient HD pending  Cyclical nausea and vomiting History of diabetic gastroparesis -Reglan on hold, QTc was prolonged -Supportive care, PPI -Symptoms improving after transition to HD  Type 1 diabetes mellitus with hyperglycemia -CBGs stable today, continue current dose of Levemir and meal coverage  Anemia of chronic disease -Check iron studies  DVT prophylaxis: Heparin subcutaneous Code Status: Full code Family Communication: None  present Disposition Plan: Home pending clip for outpatient dialysis  Consultants: PCCM transfer, CHF team, nephrology   Procedures:   Antimicrobials:    Objective: Vitals:   09/15/22 0107 09/15/22 0500 09/15/22 0733 09/15/22 0800  BP: (!) 122/102 119/83 (!) 129/106   Pulse: 100 (!) 107 (!) 110 (!) 105  Resp: 18 18 17    Temp: 99.4 F (37.4 C) 98.8 F (37.1 C) 98.9 F (37.2 C)   TempSrc: Oral Oral Oral   SpO2: 92% 90% 99%   Weight:  59.8 kg    Height:        Intake/Output Summary (Last 24 hours) at 09/15/2022 1019 Last data filed at 09/15/2022 5956 Gross per 24 hour  Intake 460 ml  Output 1500 ml  Net -1040 ml   Filed Weights   09/14/22 2148 09/15/22 0045 09/15/22 0500  Weight: 61.3 kg 60.4 kg 59.8 kg    Examination:  General exam: Chronically ill female sitting up in bed, AAOx3, no distress HEENT: Positive JVD, right IJ HD catheter CVS: S1-S2, regular rhythm, tachycardic Lungs: Clear bilaterally Abdomen: Soft, nontender, bowel sounds present, PD catheter Extremities: No edema  Psychiatry:  Mood & affect appropriate.     Data Reviewed:   CBC: Recent Labs  Lab 09/11/22 2222 09/12/22 0841 09/12/22 1453 09/13/22 1032 09/15/22 0542  WBC 6.2 6.6  --  3.7* 2.7*  HGB 10.6* 10.7* 9.9* 9.5* 9.8*  HCT 33.8* 32.6* 29.0* 28.2* 29.5*  MCV 100.6* 96.4  --  95.6 96.1  PLT 266 255  --  205 387   Basic Metabolic Panel: Recent Labs  Lab 09/12/22 0008 09/12/22 0235 09/12/22 1423 09/12/22 1453 09/13/22 1032 09/14/22 0043 09/14/22 0825 09/15/22 0542  NA  --    < > 132* 132* 131* 130* 134* 133*  K  --    < > 6.1* 5.8* 5.2* 5.4*  3.8 4.0  CL  --    < > 96*  --  94* 92* 95* 95*  CO2  --    < > 16*  --  18* 15* 18* 23  GLUCOSE  --    < > 148*  --  188* 396* 108* 125*  BUN  --    < > 109*  --  107* 106* 102* 68*  CREATININE  --    < > 13.06*  --  12.71* 12.88* 13.07* 9.88*  CALCIUM  --    < > 7.3*  --  6.4* 6.9* 6.8* 6.7*  MG 2.0  --   --   --   --   --   --    --   PHOS  --   --   --   --  9.9* 9.4*  --  6.9*   < > = values in this interval not displayed.   GFR: Estimated Creatinine Clearance: 7 mL/min (A) (by C-G formula based on SCr of 9.88 mg/dL (H)). Liver Function Tests: Recent Labs  Lab 09/11/22 2222 09/13/22 1032 09/14/22 0043 09/15/22 0542  AST 40  --   --   --   ALT 32  --   --   --   ALKPHOS 95  --   --   --   BILITOT 0.9  --   --   --   PROT 6.3*  --   --   --   ALBUMIN 3.1* 2.4* 2.6* 2.3*   No results for input(s): "LIPASE", "AMYLASE" in the last 168 hours. No results for input(s): "AMMONIA" in the last 168 hours. Coagulation Profile: No results for input(s): "INR", "PROTIME" in the last 168 hours. Cardiac Enzymes: No results for input(s): "CKTOTAL", "CKMB", "CKMBINDEX", "TROPONINI" in the last 168 hours. BNP (last 3 results) No results for input(s): "PROBNP" in the last 8760 hours. HbA1C: No results for input(s): "HGBA1C" in the last 72 hours. CBG: Recent Labs  Lab 09/14/22 1600 09/14/22 2044 09/15/22 0125 09/15/22 0514 09/15/22 0547  GLUCAP 133* 144* 133* 44* 84   Lipid Profile: No results for input(s): "CHOL", "HDL", "LDLCALC", "TRIG", "CHOLHDL", "LDLDIRECT" in the last 72 hours. Thyroid Function Tests: No results for input(s): "TSH", "T4TOTAL", "FREET4", "T3FREE", "THYROIDAB" in the last 72 hours. Anemia Panel: No results for input(s): "VITAMINB12", "FOLATE", "FERRITIN", "TIBC", "IRON", "RETICCTPCT" in the last 72 hours. Urine analysis:    Component Value Date/Time   COLORURINE YELLOW 07/28/2022 1503   APPEARANCEUR CLEAR 07/28/2022 1503   LABSPEC 1.025 07/28/2022 1503   PHURINE 5.5 07/28/2022 1503   GLUCOSEU NEGATIVE 07/28/2022 1503   HGBUR MODERATE (A) 07/28/2022 1503   BILIRUBINUR NEGATIVE 07/28/2022 1503   KETONESUR NEGATIVE 07/28/2022 1503   PROTEINUR >=300 (A) 07/28/2022 1503   UROBILINOGEN 0.2 12/15/2015 1736   NITRITE NEGATIVE 07/28/2022 1503   LEUKOCYTESUR NEGATIVE 07/28/2022 1503    Sepsis Labs: @LABRCNTIP (procalcitonin:4,lacticidven:4)  ) Recent Results (from the past 240 hour(s))  Resp panel by RT-PCR (RSV, Flu A&B, Covid) Anterior Nasal Swab     Status: None   Collection Time: 09/11/22 11:15 PM   Specimen: Anterior Nasal Swab  Result Value Ref Range Status   SARS Coronavirus 2 by RT PCR NEGATIVE NEGATIVE Final    Comment: (NOTE) SARS-CoV-2 target nucleic acids are NOT DETECTED.  The SARS-CoV-2 RNA is generally detectable in upper respiratory specimens during the acute phase of infection. The lowest concentration of SARS-CoV-2 viral copies this assay can detect is 138 copies/mL. A  negative result does not preclude SARS-Cov-2 infection and should not be used as the sole basis for treatment or other patient management decisions. A negative result may occur with  improper specimen collection/handling, submission of specimen other than nasopharyngeal swab, presence of viral mutation(s) within the areas targeted by this assay, and inadequate number of viral copies(<138 copies/mL). A negative result must be combined with clinical observations, patient history, and epidemiological information. The expected result is Negative.  Fact Sheet for Patients:  EntrepreneurPulse.com.au  Fact Sheet for Healthcare Providers:  IncredibleEmployment.be  This test is no t yet approved or cleared by the [place] FDA and  has been authorized for detection and/or diagnosis of SARS-CoV-2 by FDA under an Emergency Use Authorization (EUA). This EUA will remain  in effect (meaning this test can be used) for the duration of the COVID-19 declaration under Section 564(b)(1) of the Act, 21 U.S.C.section 360bbb-3(b)(1), unless the authorization is terminated  or revoked sooner.       Influenza A by PCR NEGATIVE NEGATIVE Final   Influenza B by PCR NEGATIVE NEGATIVE Final    Comment: (NOTE) The Xpert Xpress SARS-CoV-2/FLU/RSV plus assay is  intended as an aid in the diagnosis of influenza from Nasopharyngeal swab specimens and should not be used as a sole basis for treatment. Nasal washings and aspirates are unacceptable for Xpert Xpress SARS-CoV-2/FLU/RSV testing.  Fact Sheet for Patients: EntrepreneurPulse.com.au  Fact Sheet for Healthcare Providers: IncredibleEmployment.be  This test is not yet approved or cleared by the [place] FDA and has been authorized for detection and/or diagnosis of SARS-CoV-2 by FDA under an Emergency Use Authorization (EUA). This EUA will remain in effect (meaning this test can be used) for the duration of the COVID-19 declaration under Section 564(b)(1) of the Act, 21 U.S.C. section 360bbb-3(b)(1), unless the authorization is terminated or revoked.     Resp Syncytial Virus by PCR NEGATIVE NEGATIVE Final    Comment: (NOTE) Fact Sheet for Patients: EntrepreneurPulse.com.au  Fact Sheet for Healthcare Providers: IncredibleEmployment.be  This test is not yet approved or cleared by the [place] FDA and has been authorized for detection and/or diagnosis of SARS-CoV-2 by FDA under an Emergency Use Authorization (EUA). This EUA will remain in effect (meaning this test can be used) for the duration of the COVID-19 declaration under Section 564(b)(1) of the Act, 21 U.S.C. section 360bbb-3(b)(1), unless the authorization is terminated or revoked.  Performed at [facility], [place] [address], [place], [place] [address]      Radiology Studies: IR Fluoro Guide CV Line Right  Result Date: 09/14/2022 INDICATION: 27 year old for hemodialysis catheter EXAM: TUNNELED CENTRAL VENOUS HEMODIALYSIS CATHETER PLACEMENT WITH ULTRASOUND AND FLUOROSCOPIC GUIDANCE MEDICATIONS: 2 g Ancef. The antibiotic was given in an appropriate time interval prior to skin puncture. ANESTHESIA/SEDATION: Moderate (conscious) sedation was  employed during this procedure. A total of Versed 1.0 mg and Fentanyl 25 mcg was administered intravenously. Moderate Sedation Time: 14 minutes. The patient's level of consciousness and vital signs were monitored continuously by radiology nursing throughout the procedure under my direct supervision. FLUOROSCOPY TIME:  Fluoroscopy Time: 0 minutes 6 seconds (1 mGy). COMPLICATIONS: None PROCEDURE: Informed written consent was obtained from the patient after a discussion of the risks, benefits, and alternatives to treatment. Questions regarding the procedure were encouraged and answered. The right neck and chest were prepped with chlorhexidine in a sterile fashion, and a sterile drape was applied covering the operative field. Maximum barrier sterile technique with sterile gowns and gloves were  used for the procedure. A timeout was performed prior to the initiation of the procedure. Ultrasound survey was performed. The right internal jugular vein was confirmed to be patent, with images stored and sent to PACS. Micropuncture kit was utilized to access the right internal jugular vein under direct, real-time ultrasound guidance after the overlying soft tissues were anesthetized with 1% lidocaine with epinephrine. Stab incision was made with 11 blade scalpel. Microwire was passed centrally. The microwire was then marked to measure appropriate internal catheter length. External tunneled length was estimated. A total tip to cuff length of 19 cm was selected. 035 guidewire was advanced to the level of the IVC. Skin and subcutaneous tissues of chest wall below the clavicle were generously infiltrated with 1% lidocaine for local anesthesia. A small stab incision was made with 11 blade scalpel. The selected hemodialysis catheter was tunneled in a retrograde fashion from the anterior chest wall to the venotomy incision. Serial dilation was performed and then a peel-away sheath was placed. The catheter was then placed through the  peel-away sheath with tips ultimately positioned within the superior aspect of the right atrium. Final catheter positioning was confirmed and documented with a spot radiographic image. The catheter aspirates and flushes normally. The catheter was flushed with appropriate volume heparin dwells. The catheter exit site was secured with a 0-Prolene retention suture. Gel-Foam slurry was infused into the soft tissue tract. The venotomy incision was closed Derma bond and sterile dressing. Dressings were applied at the chest wall. Patient tolerated the procedure well and remained hemodynamically stable throughout. No complications were encountered and no significant blood loss encountered. IMPRESSION: Status post right IJ tunneled hemodialysis catheter. Signed, Dulcy Fanny. Nadene Rubins, RPVI [facility] Radiology Electronically Signed   By: Corrie Mckusick D.O.   On: 09/14/2022 17:24   IR US Guide Vasc Access Right  Result Date: 09/14/2022 INDICATION: 27 year old for hemodialysis catheter EXAM: TUNNELED CENTRAL VENOUS HEMODIALYSIS CATHETER PLACEMENT WITH ULTRASOUND AND FLUOROSCOPIC GUIDANCE MEDICATIONS: 2 g Ancef. The antibiotic was given in an appropriate time interval prior to skin puncture. ANESTHESIA/SEDATION: Moderate (conscious) sedation was employed during this procedure. A total of Versed 1.0 mg and Fentanyl 25 mcg was administered intravenously. Moderate Sedation Time: 14 minutes. The patient's level of consciousness and vital signs were monitored continuously by radiology nursing throughout the procedure under my direct supervision. FLUOROSCOPY TIME:  Fluoroscopy Time: 0 minutes 6 seconds (1 mGy). COMPLICATIONS: None PROCEDURE: Informed written consent was obtained from the patient after a discussion of the risks, benefits, and alternatives to treatment. Questions regarding the procedure were encouraged and answered. The right neck and chest were prepped with  chlorhexidine in a sterile fashion, and a sterile drape was applied covering the operative field. Maximum barrier sterile technique with sterile gowns and gloves were used for the procedure. A timeout was performed prior to the initiation of the procedure. Ultrasound survey was performed. The right internal jugular vein was confirmed to be patent, with images stored and sent to PACS. Micropuncture kit was utilized to access the right internal jugular vein under direct, real-time ultrasound guidance after the overlying soft tissues were anesthetized with 1% lidocaine with epinephrine. Stab incision was made with 11 blade scalpel. Microwire was passed centrally. The microwire was then marked to measure appropriate internal catheter length. External tunneled length was estimated. A total tip to cuff length of 19 cm was selected. 035 guidewire was advanced to the level of the IVC. Skin and subcutaneous tissues  of chest wall below the clavicle were generously infiltrated with 1% lidocaine for local anesthesia. A small stab incision was made with 11 blade scalpel. The selected hemodialysis catheter was tunneled in a retrograde fashion from the anterior chest wall to the venotomy incision. Serial dilation was performed and then a peel-away sheath was placed. The catheter was then placed through the peel-away sheath with tips ultimately positioned within the superior aspect of the right atrium. Final catheter positioning was confirmed and documented with a spot radiographic image. The catheter aspirates and flushes normally. The catheter was flushed with appropriate volume heparin dwells. The catheter exit site was secured with a 0-Prolene retention suture. Gel-Foam slurry was infused into the soft tissue tract. The venotomy incision was closed Derma bond and sterile dressing. Dressings were applied at the chest wall. Patient tolerated the procedure well and remained hemodynamically stable throughout. No complications were  encountered and no significant blood loss encountered. IMPRESSION: Status post right IJ tunneled hemodialysis catheter. Signed, Dulcy Fanny. Nadene Rubins, RPVI [facility]iology [facility] Radiology Electronically Signed   By: Corrie Mckusick D.O.   On: 09/14/2022 17:24     Scheduled Meds:  amitriptyline  50 mg Oral QHS   atorvastatin  40 mg Oral Daily   calcium acetate  1,334 mg Oral TID WC   Chlorhexidine Gluconate Cloth  6 each Topical Daily   Chlorhexidine Gluconate Cloth  6 each Topical Q0600   clonazePAM  0.5 mg Oral QHS   gentamicin cream  1 Application Topical Daily   heparin  5,000 Units Subcutaneous Q8H   heparin sodium (porcine)       insulin aspart  0-5 Units Subcutaneous QHS   insulin aspart  0-9 Units Subcutaneous TID WC   insulin aspart  3 Units Subcutaneous TID WC   insulin detemir  9 Units Subcutaneous QHS   pantoprazole  40 mg Oral BID   senna-docusate  1 tablet Oral BID   Continuous Infusions:   ceFAZolin (ANCEF) IV     dialysis solution 1.5% low-MG/low-CA Stopped (09/12/22 1044)     LOS: 3 days    Time spent: 73min    Domenic Polite, MD [facility]   09/15/2022, 10:19 AM

## 2022-09-15 NOTE — TOC Initial Note (Signed)
Transition of Care [facility]) - Initial/Assessment Note    Patient Details  Name: Kathy Lewis MRN: [id number] Date of Birth: 1995/12/06  [facility] CM/SW Contact:    Erenest Rasher, RN Phone Number: [phone] 09/15/2022, 3:19 PM  Clinical Narrative:                 HF TOC CM spoke to pt and lives at home with minor child. States she can drive to her appts. She has scale at home to complete daily weights. Will request meds come up from [facility].   Expected Discharge Plan: Home/Self Care Barriers to Discharge: No Barriers Identified   Patient Goals and CMS Choice Patient states their goals for this hospitalization and ongoing recovery are:: wants to recover          Expected Discharge Plan and Services   Discharge Planning Services: CM Consult   Living arrangements for the past 2 months: Apartment                                      Prior Living Arrangements/Services Living arrangements for the past 2 months: Apartment Lives with:: Minor Children Patient language and need for interpreter reviewed:: Yes Do you feel safe going back to the place where you live?: Yes      Need for Family Participation in Patient Care: No (Comment) Care giver support system in place?: Yes (comment)   Criminal Activity/Legal Involvement Pertinent to Current Situation/Hospitalization: No - Comment as needed  Activities of Daily Living Home Assistive Devices/Equipment: Blood pressure cuff ADL Screening (condition at time of admission) Patient's cognitive ability adequate to safely complete daily activities?: Yes Is the patient deaf or have difficulty hearing?: No Does the patient have difficulty seeing, even when wearing glasses/contacts?: No Does the patient have difficulty concentrating, remembering, or making decisions?: No Patient able to express need for assistance with ADLs?: Yes Does the patient have difficulty dressing or bathing?: No Independently  performs ADLs?: Yes (appropriate for developmental age) Does the patient have difficulty walking or climbing stairs?: Yes Weakness of Legs: None Weakness of Arms/Hands: None  Permission Sought/Granted Permission sought to share information with : Case Manager, Family Supports, PCP Permission granted to share information with : Yes, Verbal Permission Granted  Share Information with NAME: Trinisha Paget     Permission granted to share info w Relationship: mother  Permission granted to share info w Contact Information: [phone]  Emotional Assessment Appearance:: Appears stated age Attitude/Demeanor/Rapport: Engaged Affect (typically observed): Accepting Orientation: : Oriented to Self, Oriented to Place, Oriented to  Time, Oriented to Situation   Psych Involvement: No (comment)  Admission diagnosis:  Acute systolic heart failure (HCC) [I50.21] Hyperkalemia [O03.5] Cyclical vomiting with nausea [R11.15] Nausea and vomiting, unspecified vomiting type [R11.2] Patient Active Problem List   Diagnosis Date Noted   Cyclical vomiting with nausea 59/74/1638   Acute systolic heart failure ([place]) 09/12/2022   Acute on chronic systolic heart failure ([place]) 45/36/4680   Metabolic acidosis, increased anion gap 07/28/2022   Elevated LFTs 07/28/2022   Hyperglycemia 06/30/2022   DKA (diabetic ketoacidosis) ([place]) 06/28/2022   Hypertensive urgency 06/27/2022   DKA, type 1 ([place]) 06/26/2022   Nausea & vomiting 03/15/2022   Mixed diabetic hyperlipidemia associated with type 1 diabetes mellitus ([place]) 01/31/2022   Intractable hiccups 01/31/2022   Abdominal pain with vomiting 01/24/2022   Uncontrolled type 1 diabetes mellitus  with hyperglycemia, with long-term current use of insulin ([place]) 01/24/2022   ESRD on peritoneal dialysis ([place]) 11/23/2021   Abnormal CT of the chest 11/23/2021   Gastroparesis 11/22/2021   Pulmonary embolism ([place]) 11/22/2021   Hyperkalemia 08/11/2021   Bipolar 2 disorder (HCC)     Essential hypertension    Chronic systolic CHF (congestive heart failure) ([place]) 07/11/2020   Hypoalbuminemia    Anxiety    Normocytic anemia    Prolonged QT interval    Dyslipidemia 05/18/2020   Preeclampsia 10/15/2019   Hypokalemia    Noncompliance with medications 12/18/2014   Intractable nausea and vomiting 12/18/2014   PCP:  [place]:   [place], [place] [address] Phone: [phone] Fax: [place] [address], [place] [address] Phone: [phone] Fax: [phone]     Social Determinants of Health (SDOH) Social History: [place]: No Food Insecurity (09/13/2022)  Housing: Low Risk  (09/13/2022)  Transportation Needs: No Transportation Needs (09/13/2022)  Utilities: Not At Risk (09/13/2022)  Depression (PHQ2-9): High Risk (08/30/2022)  Tobacco Use: Medium Risk (09/14/2022)   SDOH Interventions:     Readmission Risk Interventions    01/31/2022   11:13 AM 08/12/2021    1:11 PM  Readmission Risk Prevention Plan  Transportation Screening Complete Complete  Medication Review (RN Care Manager) Referral to Pharmacy Complete  PCP or Specialist appointment within 3-5 days of discharge Complete Complete  HRI or Home Care Consult Complete Complete  SW Recovery Care/Counseling Consult Complete Complete  Palliative Care Screening Not Applicable Not [place] Not Applicable Not Applicable

## 2022-09-15 NOTE — Plan of Care (Signed)
  Problem: Clinical Measurements: Goal: Will remain free from infection Outcome: Completed/Met   Problem: Nutrition: Goal: Adequate nutrition will be maintained Outcome: Completed/Met   Problem: Elimination: Goal: Will not experience complications related to bowel motility Outcome: Completed/Met Goal: Will not experience complications related to urinary retention Outcome: Completed/Met   Problem: Safety: Goal: Ability to remain free from injury will improve Outcome: Completed/Met   Problem: Skin Integrity: Goal: Risk for impaired skin integrity will decrease Outcome: Completed/Met

## 2022-09-15 NOTE — Progress Notes (Signed)
Received patient in bed to unit.  Alert and oriented.  Informed consent signed and in chart.   Treatment initiated: 2206 Treatment completed: 0040   Patient tolerated well.  Transported back to the room  Alert, without acute distress.  Hand-off given to patient's nurse.   Access used: dialysis cath Access issues: none  Total UF removed: 1L Medication(s) given: none Post HD VS: see table below Post HD weight: 60.4 kg using bed scale    09/15/22 0045  Vitals  BP (!) 127/97  MAP (mmHg) 107  BP Location Right Arm  BP Method Automatic  Patient Position (if appropriate) Lying  Pulse Rate 96  Pulse Rate Source Monitor  ECG Heart Rate 97  Resp 17  Oxygen Therapy  SpO2 100 %  O2 Device Room Air  Patient Activity (if Appropriate) In bed  Pulse Oximetry Type Continuous  During Treatment Monitoring  Intra-Hemodialysis Comments Tolerated well  Post Treatment  Dialyzer Clearance Lightly streaked  Duration of HD Treatment -hour(s) 2.5 hour(s)  Hemodialysis Intake (mL) 0 mL  Liters Processed 30  Fluid Removed (mL) 1000 mL  Tolerated HD Treatment Yes  Post-Hemodialysis Comments tx tolerated well      [facility]

## 2022-09-15 NOTE — Plan of Care (Signed)
  Problem: Education: Goal: Ability to describe self-care measures that may prevent or decrease complications (Diabetes Survival Skills Education) will improve Outcome: Progressing Goal: Individualized Educational Video(s) Outcome: Progressing   Problem: Coping: Goal: Ability to adjust to condition or change in health will improve Outcome: Progressing   Problem: Fluid Volume: Goal: Ability to maintain a balanced intake and output will improve Outcome: Progressing   Problem: Health Behavior/Discharge Planning: Goal: Ability to identify and utilize available resources and services will improve Outcome: Progressing Goal: Ability to manage health-related needs will improve Outcome: Progressing   Problem: Metabolic: Goal: Ability to maintain appropriate glucose levels will improve Outcome: Progressing   Problem: Nutritional: Goal: Maintenance of adequate nutrition will improve Outcome: Progressing Goal: Progress toward achieving an optimal weight will improve Outcome: Progressing   Problem: Skin Integrity: Goal: Risk for impaired skin integrity will decrease Outcome: Progressing   Problem: Tissue Perfusion: Goal: Adequacy of tissue perfusion will improve Outcome: Progressing   Problem: Education: Goal: Knowledge of General Education information will improve Description: Including pain rating scale, medication(s)/side effects and non-pharmacologic comfort measures Outcome: Progressing   Problem: Health Behavior/Discharge Planning: Goal: Ability to manage health-related needs will improve Outcome: Progressing   Problem: Clinical Measurements: Goal: Ability to maintain clinical measurements within normal limits will improve Outcome: Progressing Goal: Diagnostic test results will improve Outcome: Progressing Goal: Respiratory complications will improve Outcome: Progressing Goal: Cardiovascular complication will be avoided Outcome: Progressing   Problem:  Activity: Goal: Risk for activity intolerance will decrease Outcome: Progressing   Problem: Coping: Goal: Level of anxiety will decrease Outcome: Progressing   Problem: Pain Managment: Goal: General experience of comfort will improve Outcome: Progressing

## 2022-09-15 NOTE — Progress Notes (Addendum)
Advanced Heart Failure Rounding Note  PCP-Cardiologist: None   Subjective:    Wasn't responding to PD. Tunneled cath placed yesterday. Had 1st session iHD yesterday. Cr and BUN now downtrending.   Feels better this morning. Denies CP/SOB. Asking to go home.   Objective:   Weight Range: 59.8 kg Body mass index is 25.74 kg/m.   Vital Signs:   Temp:  [97.6 F (36.4 C)-99.4 F (37.4 C)] 98.9 F (37.2 C) (01/19 0733) Pulse Rate:  [91-110] 105 (01/19 0800) Resp:  [14-41] 17 (01/19 0733) BP: (108-131)/(83-111) 129/106 (01/19 0733) SpO2:  [17 %-100 %] 99 % (01/19 0733) Weight:  [59.8 kg-61.3 kg] 59.8 kg (01/19 0500) Last BM Date : 09/14/22  Weight change: Filed Weights   09/14/22 2148 09/15/22 0045 09/15/22 0500  Weight: 61.3 kg 60.4 kg 59.8 kg    Intake/Output:   Intake/Output Summary (Last 24 hours) at 09/15/2022 0848 Last data filed at 09/15/2022 0742 Gross per 24 hour  Intake 700 ml  Output 1500 ml  Net -800 ml     Physical Exam   General:  well appearing.  No respiratory difficulty HEENT: normal Neck: supple. JVD remains elevated. Carotids 2+ bilat; no bruits. No lymphadenopathy or thyromegaly appreciated. R IJ tunneled HD cath Cor: PMI nondisplaced. Tachy rate & rhythm. No rubs, gallops or murmurs. Lungs: clear Abdomen: soft, nontender, nondistended. No hepatosplenomegaly. No bruits or masses. Good bowel sounds. Extremities: no cyanosis, clubbing, rash, edema  Neuro: alert & oriented x 3, cranial nerves grossly intact. moves all 4 extremities w/o difficulty. Affect pleasant.   Telemetry   ST 110s (Personally reviewed)    Labs    CBC Recent Labs    09/13/22 1032 09/15/22 0542  WBC 3.7* 2.7*  HGB 9.5* 9.8*  HCT 28.2* 29.5*  MCV 95.6 96.1  PLT 205 096   Basic Metabolic Panel Recent Labs    09/14/22 0043 09/14/22 0825 09/15/22 0542  NA 130* 134* 133*  K 5.4* 3.8 4.0  CL 92* 95* 95*  CO2 15* 18* 23  GLUCOSE 396* 108* 125*  BUN 106* 102*  68*  CREATININE 12.88* 13.07* 9.88*  CALCIUM 6.9* 6.8* 6.7*  PHOS 9.4*  --  6.9*   Liver Function Tests Recent Labs    09/14/22 0043 09/15/22 0542  ALBUMIN 2.6* 2.3*   No results for input(s): "LIPASE", "AMYLASE" in the last 72 hours. Cardiac Enzymes No results for input(s): "CKTOTAL", "CKMB", "CKMBINDEX", "TROPONINI" in the last 72 hours.  BNP: BNP (last 3 results) Recent Labs    11/22/21 1027 06/27/22 1730 09/11/22 2222  BNP 2,727.2* >4,500.0* 2,413.8*    ProBNP (last 3 results) No results for input(s): "PROBNP" in the last 8760 hours.   D-Dimer No results for input(s): "DDIMER" in the last 72 hours. Hemoglobin A1C No results for input(s): "HGBA1C" in the last 72 hours. Fasting Lipid Panel No results for input(s): "CHOL", "HDL", "LDLCALC", "TRIG", "CHOLHDL", "LDLDIRECT" in the last 72 hours. Thyroid Function Tests No results for input(s): "TSH", "T4TOTAL", "T3FREE", "THYROIDAB" in the last 72 hours.  Invalid input(s): "FREET3"  Other results:   Imaging    IR Fluoro Guide CV Line Right  Result Date: 09/14/2022 INDICATION: 27 year old for hemodialysis catheter EXAM: TUNNELED CENTRAL VENOUS HEMODIALYSIS CATHETER PLACEMENT WITH ULTRASOUND AND FLUOROSCOPIC GUIDANCE MEDICATIONS: 2 g Ancef. The antibiotic was given in an appropriate time interval prior to skin puncture. ANESTHESIA/SEDATION: Moderate (conscious) sedation was employed during this procedure. A total of Versed 1.0 mg and Fentanyl 25 mcg  was administered intravenously. Moderate Sedation Time: 14 minutes. The patient's level of consciousness and vital signs were monitored continuously by radiology nursing throughout the procedure under my direct supervision. FLUOROSCOPY TIME:  Fluoroscopy Time: 0 minutes 6 seconds (1 mGy). COMPLICATIONS: None PROCEDURE: Informed written consent was obtained from the patient after a discussion of the risks, benefits, and alternatives to treatment. Questions regarding the  procedure were encouraged and answered. The right neck and chest were prepped with chlorhexidine in a sterile fashion, and a sterile drape was applied covering the operative field. Maximum barrier sterile technique with sterile gowns and gloves were used for the procedure. A timeout was performed prior to the initiation of the procedure. Ultrasound survey was performed. The right internal jugular vein was confirmed to be patent, with images stored and sent to PACS. Micropuncture kit was utilized to access the right internal jugular vein under direct, real-time ultrasound guidance after the overlying soft tissues were anesthetized with 1% lidocaine with epinephrine. Stab incision was made with 11 blade scalpel. Microwire was passed centrally. The microwire was then marked to measure appropriate internal catheter length. External tunneled length was estimated. A total tip to cuff length of 19 cm was selected. 035 guidewire was advanced to the level of the IVC. Skin and subcutaneous tissues of chest wall below the clavicle were generously infiltrated with 1% lidocaine for local anesthesia. A small stab incision was made with 11 blade scalpel. The selected hemodialysis catheter was tunneled in a retrograde fashion from the anterior chest wall to the venotomy incision. Serial dilation was performed and then a peel-away sheath was placed. The catheter was then placed through the peel-away sheath with tips ultimately positioned within the superior aspect of the right atrium. Final catheter positioning was confirmed and documented with a spot radiographic image. The catheter aspirates and flushes normally. The catheter was flushed with appropriate volume heparin dwells. The catheter exit site was secured with a 0-Prolene retention suture. Gel-Foam slurry was infused into the soft tissue tract. The venotomy incision was closed Derma bond and sterile dressing. Dressings were applied at the chest wall. Patient tolerated the  procedure well and remained hemodynamically stable throughout. No complications were encountered and no significant blood loss encountered. IMPRESSION: Status post right IJ tunneled hemodialysis catheter. Signed, Dulcy Fanny. Nadene Rubins, RPVI [facility] Rad[facility] Radiology Electronically Signed   By: Corrie Mckusick D.O.   On: 09/14/2022 17:24   IR US Guide Vasc Access Right  Result Date: 09/14/2022 INDICATION: 27 year old for hemodialysis catheter EXAM: TUNNELED CENTRAL VENOUS HEMODIALYSIS CATHETER PLACEMENT WITH ULTRASOUND AND FLUOROSCOPIC GUIDANCE MEDICATIONS: 2 g Ancef. The antibiotic was given in an appropriate time interval prior to skin puncture. ANESTHESIA/SEDATION: Moderate (conscious) sedation was employed during this procedure. A total of Versed 1.0 mg and Fentanyl 25 mcg was administered intravenously. Moderate Sedation Time: 14 minutes. The patient's level of consciousness and vital signs were monitored continuously by radiology nursing throughout the procedure under my direct supervision. FLUOROSCOPY TIME:  Fluoroscopy Time: 0 minutes 6 seconds (1 mGy). COMPLICATIONS: None PROCEDURE: Informed written consent was obtained from the patient after a discussion of the risks, benefits, and alternatives to treatment. Questions regarding the procedure were encouraged and answered. The right neck and chest were prepped with chlorhexidine in a sterile fashion, and a sterile drape was applied covering the operative field. Maximum barrier sterile technique with sterile gowns and gloves were used for the procedure. A timeout was performed prior to the initiation of the  procedure. Ultrasound survey was performed. The right internal jugular vein was confirmed to be patent, with images stored and sent to PACS. Micropuncture kit was utilized to access the right internal jugular vein under direct, real-time ultrasound guidance after the overlying soft tissues were anesthetized  with 1% lidocaine with epinephrine. Stab incision was made with 11 blade scalpel. Microwire was passed centrally. The microwire was then marked to measure appropriate internal catheter length. External tunneled length was estimated. A total tip to cuff length of 19 cm was selected. 035 guidewire was advanced to the level of the IVC. Skin and subcutaneous tissues of chest wall below the clavicle were generously infiltrated with 1% lidocaine for local anesthesia. A small stab incision was made with 11 blade scalpel. The selected hemodialysis catheter was tunneled in a retrograde fashion from the anterior chest wall to the venotomy incision. Serial dilation was performed and then a peel-away sheath was placed. The catheter was then placed through the peel-away sheath with tips ultimately positioned within the superior aspect of the right atrium. Final catheter positioning was confirmed and documented with a spot radiographic image. The catheter aspirates and flushes normally. The catheter was flushed with appropriate volume heparin dwells. The catheter exit site was secured with a 0-Prolene retention suture. Gel-Foam slurry was infused into the soft tissue tract. The venotomy incision was closed Derma bond and sterile dressing. Dressings were applied at the chest wall. Patient tolerated the procedure well and remained hemodynamically stable throughout. No complications were encountered and no significant blood loss encountered. IMPRESSION: Status post right IJ tunneled hemodialysis catheter. Signed, Dulcy Fanny. Nadene Rubins, RPVI V[facility] Interventional Radiology Specialists [facility] Radiology Electronically Signed   By: Corrie Mckusick D.O.   On: 09/14/2022 17:24     Medications:     Scheduled Medications:  amitriptyline  50 mg Oral QHS   atorvastatin  40 mg Oral Daily   calcium acetate  1,334 mg Oral TID WC   Chlorhexidine Gluconate Cloth  6 each Topical Daily   Chlorhexidine Gluconate Cloth  6 each  Topical Q0600   clonazePAM  0.5 mg Oral QHS   gentamicin cream  1 Application Topical Daily   heparin  5,000 Units Subcutaneous Q8H   heparin sodium (porcine)       insulin aspart  0-5 Units Subcutaneous QHS   insulin aspart  0-9 Units Subcutaneous TID WC   insulin aspart  3 Units Subcutaneous TID WC   insulin detemir  9 Units Subcutaneous QHS   pantoprazole  40 mg Oral BID   senna-docusate  1 tablet Oral BID    Infusions:   ceFAZolin (ANCEF) IV     dialysis solution 1.5% low-MG/low-CA Stopped (09/12/22 1044)    PRN Medications: acetaminophen, guaiFENesin-dextromethorphan, heparin sodium (porcine), heparin sodium (porcine) 3,000 Units in dialysis solution 1.5% low-MG/low-CA 6,000 mL dialysis solution, LORazepam, metoCLOPramide, oxyCODONE, prochlorperazine    Patient Profile   27 y/o woman with DM1, ESRD on PD and presumed peripartum CM dating back to 2021 with severe LV dysfunction. Has been followed at [facility] AHF team but work-up complicated by lack of f/o and compliance issues.    Recently admitted to [facility] and underwent RHC with markedly elevated filling pressures and low output. Started on inotropes. Also underwent CVVHD for several days but refused conversion to PD. Left AMA.    Now readmitted with n/v, ab pain wit recurrent fluid overload. AHF asked to see to assist with HF management.  Assessment/Plan  1. Acute on  chronic systolic heart failure - Hx Peripartum CM 03/2020. [place] 07/29/20:  CO low-normal by TD (CO/CI) Thermodilution: 4.07/2.59) and normal cardiac output by Fick (CO/CI Fick: 4.7/3.0) ECHOs 21' suggested evidence of possible non-compaction, but MRI 44' showed no evidence of infarction, myocarditis, or infiltrative patterns. TTE on 07/30/20 with LVEF 35-40% but declined to 25-30% around 04/2021 likely in the setting of noncompliance with medical therapies.  - Echo 10/2021: EF of 20%. Small pericardial effusion. Indeterminate diastolic function   - Echo 08/14/22:  EF 13%, mod MR, severe TR, PASP ~50, small effusion - RHC at [facility] 12/23: RA mean, PA mean 56, PCWP 39, PA sat 56%, Fick CI 1.7, Thermo CI 1.9 - Has been followed by AHF team at [facility] but management c/b noncompliance.  - Admitted with volume overload. PD wasn't working. Transitioned to [facility] 1/19.  Can use DBA as needed to support BP and permit volume removal. BP stable. - Considering transfer to [facility] this admit for heart-kidney transplant.   2. End stage renal disease on PD - PD per Nephrology. If ineffective, will need CVVHD - Wasn't responding to PD. Tunneled cath placed yesterday. Had 1st session iHD yesterday. Cr and BUN now downtrending.    Hyperkalemia - K 7 on admission, received lokelma - 4 today, stable   4. DMI - per primary - Hgb A1c 8.6 12/23 - On SSI.    5. Anemia of chronic disease - No CBC today.    Length of Stay: [place], NP  09/15/2022, 8:48 AM  Advanced Heart Failure Team Pager [phone] (M-F; 7a - 5p)  Please contact [facility] for night-coverage after hours (5p -7a ) and weekends on [email or web address].  Patient seen and examined with the above-signed Advanced Practice Provider and/or Housestaff. I personally reviewed laboratory data, imaging studies and relevant notes. I independently examined the patient and formulated the important aspects of the plan. I have edited the note to reflect any of my changes or salient points. I have personally discussed the plan with the patient and/or family.  Tolerated iHD well. Breathing better. No orthopnea or PND. Nausea resolved Wants to go home  General:  Lying in bed No resp difficulty HEENT: normal Neck: supple.JVP 8-9 Carotids 2+ bilat; no bruits. No lymphadenopathy or thryomegaly appreciated. Cor: PMI nondisplaced. Regular rate & rhythm. No rubs, gallops or murmurs. + Perm cath Lungs: clear Abdomen: soft, nontender, nondistended. No hepatosplenomegaly. No bruits or masses. Good bowel sounds. Extremities: no  cyanosis, clubbing, rash, tr edema Neuro: alert & orientedx3, cranial nerves grossly intact. moves all 4 extremities w/o difficulty. Affect pleasant  Improved with HD. Still mildly volume overloaded. Plan HD tomorrow then d/c. Will follow up with moe On Tuesday. Have discussed with [facility] and will refer for heart/kidney/pancreas transplant eval.   Unable to use GDMT on d/c due to low BP. Can titrate as outpatient. Stop carvedilol and IMDUR.   Glori Bickers, MD  6:38 PM

## 2022-09-15 NOTE — Progress Notes (Signed)
CBG back to normal 84, will continue to monitor, Thanks Buckner [place].

## 2022-09-15 NOTE — Progress Notes (Signed)
Requested to see pt for out-pt HD needs at d/c. Pt was PD prior to admission. Per nephrologist note for today, pt has been accepted at [facility] on TTS and pt can start on Tuesday. Contacted [facility] and spoke to [place], Agricultural consultant. Both confirm pt has been accepted and can start on Tuesday. Clinic does not start new HD pts on Saturday. Pt will need to arrive at 6:05 am for 6:20 am chair time. Spoke to pt via phone to make sure she is aware of this info. Pt confirms knowledge of this information and agreeable to plan. Will add arrangements to AVS as well. Pt states she will drive self to HD appts. Clinic aware pt should d/c tomorrow after HD and then start on Tuesday. Clinic has access to [facility] Epic to obtain needed documentation and d/c summary.   Melven Sartorius Renal Navigator [phone]

## 2022-09-16 DIAGNOSIS — I5023 Acute on chronic systolic (congestive) heart failure: Secondary | ICD-10-CM | POA: Diagnosis not present

## 2022-09-16 LAB — CBC
HCT: 33.4 % — ABNORMAL LOW (ref 36.0–46.0)
Hemoglobin: 10.9 g/dL — ABNORMAL LOW (ref 12.0–15.0)
MCH: 31.9 pg (ref 26.0–34.0)
MCHC: 32.6 g/dL (ref 30.0–36.0)
MCV: 97.7 fL (ref 80.0–100.0)
Platelets: 157 10*3/uL (ref 150–400)
RBC: 3.42 MIL/uL — ABNORMAL LOW (ref 3.87–5.11)
RDW: 15.7 % — ABNORMAL HIGH (ref 11.5–15.5)
WBC: 3.3 10*3/uL — ABNORMAL LOW (ref 4.0–10.5)
nRBC: 0 % (ref 0.0–0.2)

## 2022-09-16 LAB — RENAL FUNCTION PANEL
Albumin: 2.4 g/dL — ABNORMAL LOW (ref 3.5–5.0)
Anion gap: 18 — ABNORMAL HIGH (ref 5–15)
BUN: 80 mg/dL — ABNORMAL HIGH (ref 6–20)
CO2: 21 mmol/L — ABNORMAL LOW (ref 22–32)
Calcium: 6.8 mg/dL — ABNORMAL LOW (ref 8.9–10.3)
Chloride: 90 mmol/L — ABNORMAL LOW (ref 98–111)
Creatinine, Ser: 10.94 mg/dL — ABNORMAL HIGH (ref 0.44–1.00)
GFR, Estimated: 5 mL/min — ABNORMAL LOW (ref >60)
Glucose, Bld: 310 mg/dL — ABNORMAL HIGH (ref 70–99)
Phosphorus: 7.2 mg/dL — ABNORMAL HIGH (ref 2.5–4.6)
Potassium: 4.4 mmol/L (ref 3.5–5.1)
Sodium: 129 mmol/L — ABNORMAL LOW (ref 135–145)

## 2022-09-16 LAB — GLUCOSE, CAPILLARY
Glucose-Capillary: 341 mg/dL — ABNORMAL HIGH (ref 70–99)
Glucose-Capillary: 73 mg/dL (ref 70–99)

## 2022-09-16 MED ORDER — HEPARIN SODIUM (PORCINE) 1000 UNIT/ML IJ SOLN
3200.0000 [IU] | Freq: Once | INTRAMUSCULAR | Status: AC
  Administered 2022-09-16: 3200 [IU] via INTRAVENOUS
  Filled 2022-09-16: qty 4

## 2022-09-16 MED ORDER — INSULIN ASPART 100 UNIT/ML IJ SOLN: 5.0000 [IU] | Freq: Three times a day (TID) | INTRAMUSCULAR | Status: DC

## 2022-09-16 NOTE — Procedures (Signed)
I was present at this dialysis session. I have reviewed the session itself and made appropriate changes.   Vital signs in last 24 hours:  Temp:  [98.5 F (36.9 C)-99.3 F (37.4 C)] 98.7 F (37.1 C) (01/20 0730) Pulse Rate:  [93-108] 99 (01/20 0755) Resp:  [13-20] 13 (01/20 0755) BP: (104-126)/(79-97) 123/97 (01/20 0755) SpO2:  [90 %-98 %] 94 % (01/20 0755) Weight:  [61.9 kg-62.7 kg] 62.7 kg (01/20 0730) Weight change: 2.858 kg Filed Weights   09/15/22 0500 09/16/22 0002 09/16/22 0730  Weight: 59.8 kg 61.9 kg 62.7 kg    Recent Labs  Lab 09/16/22 0040  NA 129*  K 4.4  CL 90*  CO2 21*  GLUCOSE 310*  BUN 80*  CREATININE 10.94*  CALCIUM 6.8*  PHOS 7.2*    Recent Labs  Lab 09/13/22 1032 09/15/22 0542 09/16/22 0040  WBC 3.7* 2.7* 3.3*  HGB 9.5* 9.8* 10.9*  HCT 28.2* 29.5* 33.4*  MCV 95.6 96.1 97.7  PLT 205 179 157    Scheduled Meds:  amitriptyline  50 mg Oral QHS   atorvastatin  40 mg Oral Daily   calcium acetate  1,334 mg Oral TID WC   Chlorhexidine Gluconate Cloth  6 each Topical Daily   Chlorhexidine Gluconate Cloth  6 each Topical Q0600   clonazePAM  0.5 mg Oral QHS   gentamicin cream  1 Application Topical Daily   heparin  5,000 Units Subcutaneous Q8H   insulin aspart  0-5 Units Subcutaneous QHS   insulin aspart  0-9 Units Subcutaneous TID WC   insulin aspart  5 Units Subcutaneous TID WC   insulin detemir  9 Units Subcutaneous QHS   pantoprazole  40 mg Oral BID   senna-docusate  1 tablet Oral BID   Continuous Infusions:  dialysis solution 1.5% low-MG/low-CA Stopped (09/12/22 1044)   PRN Meds:.acetaminophen, guaiFENesin-dextromethorphan, heparin sodium (porcine) 3,000 Units in dialysis solution 1.5% low-MG/low-CA 6,000 mL dialysis solution, LORazepam, metoCLOPramide, oxyCODONE, prochlorperazine    Dialysis Orders:  Primary nephrologist: Lufadeju  CCPD 4 exchanges 1500 fill volume 1:45 dwell time No daytime dwell  Patient reports dry weight 117 llbs.     Assessment/Plan: Hyperkalemia - secondary to missed dialysis. Has received temporizing agents in the ED. Continue Loklema. Continue CCPD here.   Repeat labs pending for this morning. ESRD -  Failed CCPD and transitioned to IHD on 09/14/22.  She tolerated HD well with marked improvement in BUN/Cr.  She has a spot at [facility] on TTS schedule.  Tolerating HD well today.  She can be discharged afterwards and follow up on Tuesday at 6:05 am at [facility].  RIJ TDC placed 09/14/22.  Her PD catheter will need to be removed as an outpatient. Nausea/vomiting - likely due to uremia and failed PD.  Plan to initiate IHD as above Hypertension/volume  - Hypotensive in ED and was admitted to ICU.  BP's have improved but will need to see how she responds to IHD.  Acute on chronic systolic CHF with NICM - EF 13%, mod MR, severe TR.  Seen by advanced heart failure team who arranged for outpatient appointment at [facility] for heart-kidney transplant if treatment unresponsive. Anemia  - Hb at goal. No ESA needs  Metabolic bone disease -  Corr Ca ok. Continue home meds.  DMT1 - insulin  per primary  Disposition - stable for discharge after dialysis and follow up at [facility] at 6:05 am on 09/19/22.   Donetta Potts,  MD 09/16/2022, 8:27 AM

## 2022-09-16 NOTE — Progress Notes (Signed)
Received patient in bed to unit.  Alert and oriented.  Informed consent signed and in chart.   Treatment initiated: 07:55 Treatment completed: 10:59  Patient tolerated well.  Transported back to the room  Alert, without acute distress.  Hand-off given to patient's nurse.   Access used: right [facility] Access issues: none  Total UF removed: 45 Medication(s) given: none    Gissella Niblack S [facility]  09/16/22 1059  Vitals  Temp (!) 97.4 F (36.3 C)  Temp Source Oral  BP (!) 122/102  MAP (mmHg) 110  BP Location Right Arm  BP Method Automatic  Patient Position (if appropriate) Lying  Pulse Rate 96  Pulse Rate Source Monitor  ECG Heart Rate 95  Resp (!) 22  Oxygen Therapy  SpO2 97 %  O2 Device Room Air  During Treatment Monitoring  HD Safety Checks Performed Yes  Intra-Hemodialysis Comments Tx completed;Tolerated well  Dialysis Fluid Bolus Normal Saline  Post Treatment  Dialyzer Clearance Lightly streaked  Duration of HD Treatment -hour(s) 3 hour(s)  Liters Processed 45  Fluid Removed (mL) 2000 mL  Tolerated HD Treatment Yes

## 2022-09-16 NOTE — Discharge Summary (Signed)
Physician Discharge Summary  Kathy Lewis WNU:[id number] DOB: [birth year] DOA: 09/11/2022  PCP: [place] date: 09/11/2022 Discharge date: 09/16/2022  Time spent: 45 minutes  Recommendations for Outpatient Follow-up:  [facility] on 09/19/2022, to arrive at 6 AM for 620 chair time Advanced heart failure clinic 1/23 at 1:40 PM [facility] transplant evaluation  Discharge Diagnoses:  Principal Problem:   Acute on chronic systolic heart failure ([place]) Active Problems:   ESRD on peritoneal dialysis ([place])   Uncontrolled type 1 diabetes mellitus with hyperglycemia, with long-term current use of insulin (HCC)   Metabolic acidosis, increased anion gap   Prolonged QT interval   Essential hypertension   Mixed diabetic hyperlipidemia associated with type 1 diabetes mellitus (HCC)   Normocytic anemia   Bipolar 2 disorder (HCC)   Hyperkalemia   Dyslipidemia   Gastroparesis   Cyclical vomiting with nausea   Discharge Condition: improved  Diet recommendation: Renal diabetic  Filed Weights   09/15/22 0500 09/16/22 0002 09/16/22 0730  Weight: 59.8 kg 61.9 kg 62.7 kg    History of present illness:  26/F with history of severe nonischemic cardiomyopathy, EF 13-20%, ESRD on peritoneal dialysis, type 1 diabetes mellitus, history of gastroparesis, bipolar disorder recently hospitalized with volume overload required CRRT with dobutamine and milrinone support, hemodialysis was recommended however patient left AMA and continued peritoneal dialysis. -Back in the ER 1/15 with nausea vomiting, dizziness hypoglycemia, in the ER she was hypertensive, potassium of 5.9, metabolic acidosis with significantly elevated BUN and creatinine. -In the ER became progressively hypotensive and was transferred to ICU -1/17 BP stabilized, tolerated PD -1/18 transferred to [facility] service -1/18, tunneled dialysis catheter placed, started on HD  Hospital Course:   Acute on chronic systolic CHF -Known  NICM, EF of 13-20% -Coreg and hydralazine on hold, CHF team following -Now started on HD 1/18, GDMT limited -Would benefit from follow-up at [facility] for kidney heart transplant, compliance may be a concern   ESRD, with volume overload, metabolic acidosis, hyperkalemia -Suboptimal peritoneal dialysis -Nephrology following, followed by Dr.Lufadeju in [facility] -Now improving after transition to HD yesterday, tunneled dialysis catheter placed in IR, continue hemodialysis, clip for outpatient HD pending   Cyclical nausea and vomiting History of diabetic gastroparesis -Reglan on hold, QTc was prolonged -Supportive care, PPI -Symptoms improving after transition to HD   Type 1 diabetes mellitus with hyperglycemia -Used Levemir and meal coverage NovoLog in the hospital, transitioned back to insulin pump at discharge   Anemia of chronic disease -Hemoglobin at goal   Consultants: PCCM transfer, CHF team, nephrology     Discharge Exam: Vitals:   09/16/22 0930 09/16/22 1000  BP: (!) 118/100 120/89  Pulse: (!) 103 99  Resp: 18 (!) 22  Temp:    SpO2: 98% 98%  General exam: Chronically ill female sitting up in bed, AAOx3, no distress HEENT: Positive JVD, right IJ HD catheter CVS: S1-S2, regular rhythm, tachycardic Lungs: Clear bilaterally Abdomen: Soft, nontender, bowel sounds present, PD catheter Extremities: No edema  Psychiatry:  Mood & affect appropriate.     Discharge Instructions   Discharge Instructions     Diet - low sodium heart healthy   Complete by: As directed    Diet Carb Modified   Complete by: As directed    Discharge instructions   Complete by: As directed    Renal diabetic diet, Resume Insulin pump ASAP   Increase activity slowly   Complete by: As directed    No wound care  Complete by: As directed       Allergies as of 09/16/2022       Reactions   Cantaloupe Extract Allergy Skin Test Itching   Mouth itching   Strawberry Extract Itching   Mouth  itching   Citrullus Vulgaris Itching   Mouth itching   Food Itching   All melon - mouth itching   Nsaids Other (See Comments)   Avoid per nephrology        Medication List     STOP taking these medications    carvedilol 12.5 MG tablet Commonly known as: COREG   chlorpheniramine-HYDROcodone 10-8 MG/5ML Commonly known as: TUSSIONEX   hydrALAZINE 50 MG tablet Commonly known as: APRESOLINE   isosorbide mononitrate 30 MG 24 hr tablet Commonly known as: IMDUR       TAKE these medications    acetaminophen 500 MG tablet Commonly known as: TYLENOL Take 1,000 mg by mouth every 6 (six) hours as needed for headache (pain).   amitriptyline 50 MG tablet Commonly known as: ELAVIL Take 1 tablet (50 mg total) by mouth at bedtime.   atorvastatin 40 MG tablet Commonly known as: LIPITOR Take 40 mg by mouth every evening.   calcitRIOL 0.25 MCG capsule Commonly known as: ROCALTROL Take 0.25 mcg by mouth every evening.   calcium carbonate 500 MG chewable tablet Commonly known as: TUMS - dosed in mg elemental calcium Take 2 tablets by mouth 3 (three) times daily with meals.   clonazePAM 0.5 MG tablet Commonly known as: KLONOPIN Take 1 tablet (0.5 mg total) by mouth 2 (two) times daily as needed for anxiety.   hydrOXYzine 10 MG tablet Commonly known as: ATARAX Take 1 tablet (10 mg total) by mouth 3 (three) times daily as needed. What changed: reasons to take this   insulin lispro 100 UNIT/ML injection Commonly known as: HUMALOG Inject 0.5 Units into the skin See admin instructions. 0.5 units per hour via insulin pump.   LORazepam 1 MG tablet Commonly known as: Ativan Take 0.5 tablets (0.5 mg total) by mouth 3 (three) times daily as needed. What changed: reasons to take this   magnesium oxide 400 MG tablet Commonly known as: MAG-OX Take 800 mg by mouth daily.   methocarbamol 500 MG tablet Commonly known as: ROBAXIN Take 1 tablet (500 mg total) by mouth 2 (two) times  daily as needed for up to 15 doses for muscle spasms.   metoCLOPramide 10 MG tablet Commonly known as: REGLAN Take 10 mg by mouth 4 (four) times daily as needed for nausea or vomiting.   ondansetron 4 MG tablet Commonly known as: ZOFRAN Take 1 tablet (4 mg total) by mouth every 6 (six) hours. What changed:  when to take this reasons to take this   pantoprazole 40 MG tablet Commonly known as: Protonix Take 1 tablet (40 mg total) by mouth 2 (two) times daily before a meal. What changed:  when to take this reasons to take this   Vitamin D (Ergocalciferol) 1.25 MG (50000 UNIT) Caps capsule Commonly known as: DRISDOL Take 50,000 Units by mouth every Sunday.       Allergies  Allergen Reactions   Cantaloupe Extract Allergy Skin Test Itching    Mouth itching     Strawberry Extract Itching    Mouth itching   Citrullus Vulgaris Itching    Mouth itching    Food Itching    All melon - mouth itching   Nsaids Other (See Comments)    Avoid per nephrology  Follow-up Information     [facility]. Go on 09/19/2022.   Why: Schedule is Tuesday/Thursday/Saturday with 6:20 am chair time.  Please arrive at 6:05 am . Contact information: [place], [place] [address] [address] [place] Follow up on 09/19/2022.   Specialty: Cardiology Why: Follow up with Dr. Haroldine Laws in the [facility] 09/19/22 at 1:40  Entrance C, free valet Please bring all medications with you to the appt. Contact information: [address] [id number] [place] [phone]                 The results of significant diagnostics from this hospitalization (including imaging, microbiology, ancillary and laboratory) are listed below for reference.    Significant Diagnostic Studies: IR Fluoro Guide CV Line Right  Result Date: 09/14/2022 INDICATION: 27 year old for hemodialysis  catheter EXAM: TUNNELED CENTRAL VENOUS HEMODIALYSIS CATHETER PLACEMENT WITH ULTRASOUND AND FLUOROSCOPIC GUIDANCE MEDICATIONS: 2 g Ancef. The antibiotic was given in an appropriate time interval prior to skin puncture. ANESTHESIA/SEDATION: Moderate (conscious) sedation was employed during this procedure. A total of Versed 1.0 mg and Fentanyl 25 mcg was administered intravenously. Moderate Sedation Time: 14 minutes. The patient's level of consciousness and vital signs were monitored continuously by radiology nursing throughout the procedure under my direct supervision. FLUOROSCOPY TIME:  Fluoroscopy Time: 0 minutes 6 seconds (1 mGy). COMPLICATIONS: None PROCEDURE: Informed written consent was obtained from the patient after a discussion of the risks, benefits, and alternatives to treatment. Questions regarding the procedure were encouraged and answered. The right neck and chest were prepped with chlorhexidine in a sterile fashion, and a sterile drape was applied covering the operative field. Maximum barrier sterile technique with sterile gowns and gloves were used for the procedure. A timeout was performed prior to the initiation of the procedure. Ultrasound survey was performed. The right internal jugular vein was confirmed to be patent, with images stored and sent to PACS. Micropuncture kit was utilized to access the right internal jugular vein under direct, real-time ultrasound guidance after the overlying soft tissues were anesthetized with 1% lidocaine with epinephrine. Stab incision was made with 11 blade scalpel. Microwire was passed centrally. The microwire was then marked to measure appropriate internal catheter length. External tunneled length was estimated. A total tip to cuff length of 19 cm was selected. 035 guidewire was advanced to the level of the IVC. Skin and subcutaneous tissues of chest wall below the clavicle were generously infiltrated with 1% lidocaine for local anesthesia. A small stab incision  was made with 11 blade scalpel. The selected hemodialysis catheter was tunneled in a retrograde fashion from the anterior chest wall to the venotomy incision. Serial dilation was performed and then a peel-away sheath was placed. The catheter was then placed through the peel-away sheath with tips ultimately positioned within the superior aspect of the right atrium. Final catheter positioning was confirmed and documented with a spot radiographic image. The catheter aspirates and flushes normally. The catheter was flushed with appropriate volume heparin dwells. The catheter exit site was secured with a 0-Prolene retention suture. Gel-Foam slurry was infused into the soft tissue tract. The venotomy incision was closed Derma bond and sterile dressing. Dressings were applied at the chest wall. Patient tolerated the procedure well and remained hemodynamically stable throughout. No complications were encountered and no significant blood loss encountered. IMPRESSION: Status post right IJ tunneled hemodialysis catheter. Signed, Dulcy Fanny.  Nadene Rubins, RPVI V[facility] and Interventional Radiology Specialists [facility] Radiology Electronically Signed   By: Corrie Mckusick D.O.   On: 09/14/2022 17:24   IR US Guide Vasc Access Right  Result Date: 09/14/2022 INDICATION: 27 year old for hemodialysis catheter EXAM: TUNNELED CENTRAL VENOUS HEMODIALYSIS CATHETER PLACEMENT WITH ULTRASOUND AND FLUOROSCOPIC GUIDANCE MEDICATIONS: 2 g Ancef. The antibiotic was given in an appropriate time interval prior to skin puncture. ANESTHESIA/SEDATION: Moderate (conscious) sedation was employed during this procedure. A total of Versed 1.0 mg and Fentanyl 25 mcg was administered intravenously. Moderate Sedation Time: 14 minutes. The patient's level of consciousness and vital signs were monitored continuously by radiology nursing throughout the procedure under my direct supervision. FLUOROSCOPY TIME:  Fluoroscopy Time: 0 minutes 6 seconds (1 mGy).  COMPLICATIONS: None PROCEDURE: Informed written consent was obtained from the patient after a discussion of the risks, benefits, and alternatives to treatment. Questions regarding the procedure were encouraged and answered. The right neck and chest were prepped with chlorhexidine in a sterile fashion, and a sterile drape was applied covering the operative field. Maximum barrier sterile technique with sterile gowns and gloves were used for the procedure. A timeout was performed prior to the initiation of the procedure. Ultrasound survey was performed. The right internal jugular vein was confirmed to be patent, with images stored and sent to PACS. Micropuncture kit was utilized to access the right internal jugular vein under direct, real-time ultrasound guidance after the overlying soft tissues were anesthetized with 1% lidocaine with epinephrine. Stab incision was made with 11 blade scalpel. Microwire was passed centrally. The microwire was then marked to measure appropriate internal catheter length. External tunneled length was estimated. A total tip to cuff length of 19 cm was selected. 035 guidewire was advanced to the level of the IVC. Skin and subcutaneous tissues of chest wall below the clavicle were generously infiltrated with 1% lidocaine for local anesthesia. A small stab incision was made with 11 blade scalpel. The selected hemodialysis catheter was tunneled in a retrograde fashion from the anterior chest wall to the venotomy incision. Serial dilation was performed and then a peel-away sheath was placed. The catheter was then placed through the peel-away sheath with tips ultimately positioned within the superior aspect of the right atrium. Final catheter positioning was confirmed and documented with a spot radiographic image. The catheter aspirates and flushes normally. The catheter was flushed with appropriate volume heparin dwells. The catheter exit site was secured with a 0-Prolene retention suture.  Gel-Foam slurry was infused into the soft tissue tract. The venotomy incision was closed Derma bond and sterile dressing. Dressings were applied at the chest wall. Patient tolerated the procedure well and remained hemodynamically stable throughout. No complications were encountered and no significant blood loss encountered. IMPRESSION: Status post right IJ tunneled hemodialysis catheter. Signed, Dulcy Fanny. Nadene Rubins, RPVI Vascular and Interventional Radiology Specialists [facility] Radiology Electronically Signed   By: Corrie Mckusick D.O.   On: 09/14/2022 17:24   DG Abdomen 1 View  Result Date: 09/11/2022 CLINICAL DATA:  Stomach distention, concern for bowel obstruction. EXAM: ABDOMEN - 1 VIEW COMPARISON:  05/20/2021. FINDINGS: The bowel gas pattern is normal. No radio-opaque calculi. A peritoneal dialysis catheter is noted with tip coiling in the pelvis. The heart is enlarged. IMPRESSION: 1. No bowel obstruction. 2. Peritoneal dialysis catheter terminating in the pelvis. 3. Cardiomegaly. Electronically Signed   By: Brett Fairy M.D.   On: 09/11/2022 23:50   DG Chest 2 View  Result Date: 09/11/2022  CLINICAL DATA:  Chest pain, dizziness EXAM: CHEST - 2 VIEW COMPARISON:  08/24/2022 FINDINGS: Frontal and lateral views of the chest demonstrates stable enlargement of the cardiac silhouette. No airspace disease, effusion, or pneumothorax. No acute bony abnormality. IMPRESSION: 1. Stable enlarged cardiac silhouette. 2. No acute airspace disease. Electronically Signed   By: Randa Ngo M.D.   On: 09/11/2022 22:12   DG Chest Port 1 View  Result Date: 08/24/2022 CLINICAL DATA:  Shortness of breath EXAM: PORTABLE CHEST 1 VIEW COMPARISON:  Radiograph 08/18/2022, 08/12/2022 FINDINGS: Unchanged cardiomegaly. There is increased density in the left lung base with loss of silhouette with a left hemidiaphragm. Right lung is clear. No evidence of pneumothorax. No definite pleural effusion. No acute osseous  abnormality. IMPRESSION: Left basilar airspace disease which could be atelectasis or pneumonia. Unchanged cardiomegaly. Electronically Signed   By: Maurine Simmering M.D.   On: 08/24/2022 16:50    Microbiology: Recent Results (from the past 240 hour(s))  Resp panel by RT-PCR (RSV, Flu A&B, Covid) Anterior Nasal Swab     Status: None   Collection Time: 09/11/22 11:15 PM   Specimen: Anterior Nasal Swab  Result Value Ref Range Status   SARS Coronavirus 2 by RT PCR NEGATIVE NEGATIVE Final    Comment: (NOTE) SARS-CoV-2 target nucleic acids are NOT DETECTED.  The SARS-CoV-2 RNA is generally detectable in upper respiratory specimens during the acute phase of infection. The lowest concentration of SARS-CoV-2 viral copies this assay can detect is 138 copies/mL. A negative result does not preclude SARS-Cov-2 infection and should not be used as the sole basis for treatment or other patient management decisions. A negative result may occur with  improper specimen collection/handling, submission of specimen other than nasopharyngeal swab, presence of viral mutation(s) within the areas targeted by this assay, and inadequate number of viral copies(<138 copies/mL). A negative result must be combined with clinical observations, patient history, and epidemiological information. The expected result is Negative.  Fact Sheet for Patients:  EntrepreneurPulse.com.au  Fact Sheet for Healthcare Providers:  IncredibleEmployment.be  This test is no t yet approved or cleared by the [place] FDA and  has been authorized for detection and/or diagnosis of SARS-CoV-2 by FDA under an Emergency Use Authorization (EUA). This EUA will remain  in effect (meaning this test can be used) for the duration of the COVID-19 declaration under Section 564(b)(1) of the Act, 21 U.S.C.section 360bbb-3(b)(1), unless the authorization is terminated  or revoked sooner.       Influenza A by PCR  NEGATIVE NEGATIVE Final   Influenza B by PCR NEGATIVE NEGATIVE Final    Comment: (NOTE) The Xpert Xpress SARS-CoV-2/FLU/RSV plus assay is intended as an aid in the diagnosis of influenza from Nasopharyngeal swab specimens and should not be used as a sole basis for treatment. Nasal washings and aspirates are unacceptable for Xpert Xpress SARS-CoV-2/FLU/RSV testing.  Fact Sheet for Patients: EntrepreneurPulse.com.au  Fact Sheet for Healthcare Providers: IncredibleEmployment.be  This test is not yet approved or cleared by the [place] FDA and has been authorized for detection and/or diagnosis of SARS-CoV-2 by FDA under an Emergency Use Authorization (EUA). This EUA will remain in effect (meaning this test can be used) for the duration of the COVID-19 declaration under Section 564(b)(1) of the Act, 21 U.S.C. section 360bbb-3(b)(1), unless the authorization is terminated or revoked.     Resp Syncytial Virus by PCR NEGATIVE NEGATIVE Final    Comment: (NOTE) Fact Sheet for Patients: EntrepreneurPulse.com.au  Fact Sheet for Healthcare  Providers: IncredibleEmployment.be  This test is not yet approved or cleared by the [place] and has been authorized for detection and/or diagnosis of SARS-CoV-2 by FDA under an Emergency Use Authorization (EUA). This EUA will remain in effect (meaning this test can be used) for the duration of the COVID-19 declaration under Section 564(b)(1) of the Act, 21 U.S.C. section 360bbb-3(b)(1), unless the authorization is terminated or revoked.  Performed at [facility], [place] [address] [place], [place] [address]      Labs: Basic Metabolic Panel: Recent Labs  Lab 09/12/22 0008 09/12/22 0235 09/13/22 1032 09/14/22 0043 09/14/22 0825 09/15/22 0542 09/16/22 0040  NA  --    < > 131* 130* 134* 133* 129*  K  --    < > 5.2* 5.4* 3.8 4.0 4.4  CL  --    < > 94*  92* 95* 95* 90*  CO2  --    < > 18* 15* 18* 23 21*  GLUCOSE  --    < > 188* 396* 108* 125* 310*  BUN  --    < > 107* 106* 102* 68* 80*  CREATININE  --    < > 12.71* 12.88* 13.07* 9.88* 10.94*  CALCIUM  --    < > 6.4* 6.9* 6.8* 6.7* 6.8*  MG 2.0  --   --   --   --   --   --   PHOS  --   --  9.9* 9.4*  --  6.9* 7.2*   < > = values in this interval not displayed.   Liver Function Tests: Recent Labs  Lab 09/11/22 2222 09/13/22 1032 09/14/22 0043 09/15/22 0542 09/16/22 0040  AST 40  --   --   --   --   ALT 32  --   --   --   --   ALKPHOS 95  --   --   --   --   BILITOT 0.9  --   --   --   --   PROT 6.3*  --   --   --   --   ALBUMIN 3.1* 2.4* 2.6* 2.3* 2.4*   No results for input(s): "LIPASE", "AMYLASE" in the last 168 hours. No results for input(s): "AMMONIA" in the last 168 hours. CBC: Recent Labs  Lab 09/11/22 2222 09/12/22 0841 09/12/22 1453 09/13/22 1032 09/15/22 0542 09/16/22 0040  WBC 6.2 6.6  --  3.7* 2.7* 3.3*  HGB 10.6* 10.7* 9.9* 9.5* 9.8* 10.9*  HCT 33.8* 32.6* 29.0* 28.2* 29.5* 33.4*  MCV 100.6* 96.4  --  95.6 96.1 97.7  PLT 266 255  --  205 179 157   Cardiac Enzymes: No results for input(s): "CKTOTAL", "CKMB", "CKMBINDEX", "TROPONINI" in the last 168 hours. BNP: BNP (last 3 results) Recent Labs    11/22/21 1027 06/27/22 1730 09/11/22 2222  BNP 2,727.2* >4,500.0* 2,413.8*    ProBNP (last 3 results) No results for input(s): "PROBNP" in the last 8760 hours.  CBG: Recent Labs  Lab 09/15/22 0547 09/15/22 1025 09/15/22 1611 09/15/22 2130 09/16/22 0552  GLUCAP 84 126* 268* 198* 341*       Signed:  Domenic Polite MD.  [facility] 09/16/2022, 10:05 AM

## 2022-09-17 LAB — HEPATITIS B SURFACE ANTIBODY, QUANTITATIVE: Hep B S AB Quant (Post): >1000 m[IU]/mL (ref >9.9)

## 2022-09-18 NOTE — Progress Notes (Signed)
Late Note Entry:  Spoke to [facility] at [facility] this morning. Clinic advised pt was d/c on Saturday and will start tomorrow as planned. Clinic has access to [facility] epic to obtain d/c summary for continuation of care.   Melven Sartorius Renal Navigator [phone]

## 2022-09-19 ENCOUNTER — Encounter (HOSPITAL_COMMUNITY): Payer: Self-pay | Admitting: Internal Medicine

## 2022-09-19 ENCOUNTER — Ambulatory Visit (HOSPITAL_COMMUNITY)
Admit: 2022-09-19 | Discharge: 2022-09-19 | Disposition: A | Discharge status: Home / Self Care | Payer: Medicaid Other | Attending: Internal Medicine | Admitting: Internal Medicine

## 2022-09-19 VITALS — BP 90/50 | HR 107 | Wt 139.8 lb

## 2022-09-19 DIAGNOSIS — Z992 Dependence on renal dialysis: Secondary | ICD-10-CM | POA: Diagnosis not present

## 2022-09-19 DIAGNOSIS — I081 Rheumatic disorders of both mitral and tricuspid valves: Secondary | ICD-10-CM | POA: Diagnosis not present

## 2022-09-19 DIAGNOSIS — Z79899 Other long term (current) drug therapy: Secondary | ICD-10-CM | POA: Diagnosis not present

## 2022-09-19 DIAGNOSIS — E1022 Type 1 diabetes mellitus with diabetic chronic kidney disease: Secondary | ICD-10-CM | POA: Diagnosis not present

## 2022-09-19 DIAGNOSIS — N186 End stage renal disease: Secondary | ICD-10-CM | POA: Diagnosis not present

## 2022-09-19 DIAGNOSIS — I120 Hypertensive chronic kidney disease with stage 5 chronic kidney disease or end stage renal disease: Secondary | ICD-10-CM | POA: Insufficient documentation

## 2022-09-19 DIAGNOSIS — I5022 Chronic systolic (congestive) heart failure: Secondary | ICD-10-CM

## 2022-09-19 DIAGNOSIS — R0602 Shortness of breath: Secondary | ICD-10-CM | POA: Diagnosis not present

## 2022-09-19 DIAGNOSIS — R531 Weakness: Secondary | ICD-10-CM | POA: Diagnosis not present

## 2022-09-19 DIAGNOSIS — Z794 Long term (current) use of insulin: Secondary | ICD-10-CM | POA: Insufficient documentation

## 2022-09-19 DIAGNOSIS — I5023 Acute on chronic systolic (congestive) heart failure: Secondary | ICD-10-CM | POA: Diagnosis not present

## 2022-09-19 LAB — HEPATITIS B SURFACE ANTIBODY, QUANTITATIVE: Hep B S AB Quant (Post): >1000 m[IU]/mL (ref >9.9)

## 2022-09-19 LAB — GLUCOSE, CAPILLARY: Glucose-Capillary: 369 mg/dL — ABNORMAL HIGH (ref 70–99)

## 2022-09-19 NOTE — Patient Instructions (Addendum)
There has been no changes to your medications.   You have been referred to [facility]. They will call you to arrange your appointment   Your physician recommends that you schedule a follow-up appointment in: 2 months   If you have any questions or concerns before your next appointment please send [place] a message through [facility] or call our office at [phone].    TO LEAVE A MESSAGE FOR THE NURSE SELECT OPTION 2, PLEASE LEAVE A MESSAGE INCLUDING: YOUR NAME DATE OF BIRTH CALL BACK NUMBER REASON FOR CALL**this is important as we prioritize the call backs  YOU WILL RECEIVE A CALL BACK THE SAME DAY AS LONG AS YOU CALL BEFORE 4:00 PM  At the [facility], you and your health needs are our priority. As part of our continuing mission to provide you with exceptional heart care, we have created designated Provider Care Teams. These Care Teams include your primary Cardiologist (physician) and Advanced Practice Providers (APPs- Physician Assistants and Nurse Practitioners) who all work together to provide you with the care you need, when you need it.   You may see any of the following providers on your designated Care Team at your next follow up: Dr Glori Bickers Dr Loralie Champagne Dr. Roxana Hires, NP Lyda Jester, [place] [facility] [place], [place] Forestine Na, NP Audry Riles, PharmD   Please be sure to bring in all your medications bottles to every appointment.

## 2022-09-19 NOTE — Progress Notes (Signed)
ADVANCED HF CLINIC NOTE  PCP: [facility]. Primary Cardiologist: DB  HPI:  27 y/o woman with DM1, ESRD on PD and presumed peripartum CM dating back to 2021 with severe LV dysfunction. Has been followed at [facility] AHF team but work-up complicated by lack of f/o and compliance issues.    Recently admitted to [facility] and underwent RHC with markedly elevated filling pressures and low output. Started on inotropes. Also underwent CVVHD for several days but refused conversion to PD. Left AMA.   Echo 3/23 EF 20% RV moderately reduced   Admitted last week for HF decompensation. PD not working.   Had first outpatient HD today and lost 5 pounds. Post HF weighed 135 (dry weight) .Feels weak. SOB with mild activity   BP running low at times.     ROS: All systems negative except as listed in HPI, PMH and Problem List.  SH:  Social History   Socioeconomic History   Marital status: Single    Spouse name: Not on file   Number of children: 1   Years of education: Not on file   Highest education level: Not on file  Occupational History   Occupation: unemployed   Occupation: n/a  Tobacco Use   Smoking status: Former    Packs/day: 1.00    Years: 2.00    Total pack years: 2.00    Types: Cigars, Cigarettes   Smokeless tobacco: Never  Vaping Use   Vaping Use: Never used  Substance and Sexual Activity   Alcohol use: No   Drug use: No   Sexual activity: Yes    Birth control/protection: None  Other Topics Concern   Not on file  Social History Narrative   Not on file   Social Determinants of Health   Financial Resource Strain: Not on file  Food Insecurity: No Food Insecurity (09/13/2022)   Hunger Vital Sign    Worried About Running Out of Food in the Last Year: Never true    [place] in the Last Year: Never true  Transportation Needs: No Transportation Needs (09/13/2022)   PRAPARE - Hydrologist (Medical): No    Lack of  Transportation (Non-Medical): No  Physical Activity: Not on file  Stress: Not on file  Social Connections: Not on file  Intimate Partner Violence: Not At Risk (09/13/2022)   Humiliation, Afraid, Rape, and Kick questionnaire    Fear of Current or Ex-Partner: No    Emotionally Abused: No    Physically Abused: No    Sexually Abused: No    FH:  Family History  Adopted: Yes  Problem Relation Age of Onset   Heart disease Neg Hx     Past Medical History:  Diagnosis Date   Anemia    Anxiety    Bipolar 2 disorder ([place])    Chronic kidney disease    Chronic systolic (congestive) heart failure (HCC)    Depression    DKA (diabetic ketoacidoses)    ESRD on peritoneal dialysis ([place])    HSV infection    on valtrex   Hypokalemia    Leukocytosis    Migraine    Noncompliance with medication regimen    Preeclampsia    Prolonged QT syndrome    Severe anemia    Type 1 diabetes mellitus (HCC)     Current Outpatient Medications  Medication Sig Dispense Refill   acetaminophen (TYLENOL) 500 MG tablet Take 1,000 mg by mouth every 6 (six)  hours as needed for headache (pain).     amitriptyline (ELAVIL) 50 MG tablet Take 1 tablet (50 mg total) by mouth at bedtime. 30 tablet 3   atorvastatin (LIPITOR) 40 MG tablet Take 40 mg by mouth every evening.     calcium carbonate (TUMS - DOSED IN MG ELEMENTAL CALCIUM) 500 MG chewable tablet Take 2 tablets by mouth 3 (three) times daily with meals.     clonazePAM (KLONOPIN) 0.5 MG tablet Take 1 tablet (0.5 mg total) by mouth 2 (two) times daily as needed for anxiety. 20 tablet 0   hydrOXYzine (ATARAX) 10 MG tablet Take 1 tablet (10 mg total) by mouth 3 (three) times daily as needed. 90 tablet 3   insulin lispro (HUMALOG) 100 UNIT/ML injection Inject 0.5 Units into the skin See admin instructions. 0.5 units per hour via insulin pump.     LORazepam (ATIVAN) 1 MG tablet Take 0.5 tablets (0.5 mg total) by mouth 3 (three) times daily as needed. 15 tablet 0    magnesium oxide (MAG-OX) 400 MG tablet Take 800 mg by mouth daily.     methocarbamol (ROBAXIN) 500 MG tablet Take 1 tablet (500 mg total) by mouth 2 (two) times daily as needed for up to 15 doses for muscle spasms. 15 tablet 0   metoCLOPramide (REGLAN) 10 MG tablet Take 10 mg by mouth 4 (four) times daily as needed for nausea or vomiting.     ondansetron (ZOFRAN) 4 MG tablet Take 4 mg by mouth as needed for nausea or vomiting.     Vitamin D, Ergocalciferol, (DRISDOL) 1.25 MG (50000 UNIT) CAPS capsule Take 50,000 Units by mouth every Sunday.     No current facility-administered medications for this encounter.    Vitals:   09/19/22 1359  BP: (!) 90/50  Pulse: (!) 107  SpO2: 100%  Weight: 63.4 kg (139 lb 12.8 oz)    PHYSICAL EXAM:  General:  Weak appearing. No resp difficulty HEENT: normal Neck: supple. JVP to jaw Carotids 2+ bilaterally; no bruits. No lymphadenopathy or thryomegaly appreciated. Cor: PMI laterally displaced Regular tachy +s3 2/6 MR Lungs: clear Abdomen: soft, nontender, nondistended. No hepatosplenomegaly. No bruits or masses. Good bowel sounds. Extremities: no cyanosis, clubbing, rash, 1+ edema Neuro: alert & orientedx3, cranial nerves grossly intact. Moves all 4 extremities w/o difficulty. Affect pleasant.   ECG: Sinus 109 narrow QRS Personally reviewed   ASSESSMENT & PLAN:  1. 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 [place] 07/29/20:  CO low-normal by TD (CO/CI) Thermodilution: 4.07/2.59) and normal cardiac output by Fick (CO/CI Fick: 4.7/3.0) ECHOs 21' suggested evidence of possible non-compaction, but MRI 42' showed no evidence of infarction, myocarditis, or infiltrative patterns. TTE on 07/30/20 with LVEF 35-40% but declined to 25-30% around 04/2021 likely in the setting of noncompliance with medical therapies.  - Echo 10/2021: EF of 20%. Small pericardial effusion. Indeterminate diastolic function   - Echo 08/14/22: EF 13%, mod MR, severe  TR, PASP ~50, small effusion - RHC at [facility] 12/23: RA mean, PA mean 56, PCWP 39, PA sat 56%, Fick CI 1.7, Thermo CI 1.9 - Has been followed by AHF team at [facility] but wanting to switch care - Admitted with volume overload 1.24. PD wasn't working. Transitioned to [facility] 1/19.   - NYHA III-IIIB. Remains volume overloaded. -> continue volume removal with HD. May need midodrine for support - BP too low for GDMT - Have d/w with [facility] to arrange outpatient evaluation for heart-kidney-pancreas transplant.  2. End stage renal disease on PD - Wasn't responding to PD -> switched to HD this month - Continue HD - add midodrine as needed    3. DMI - per primary - Hgb A1c 8.6 12/23 - On SSI.      Glori Bickers, MD  2:06 PM

## 2022-10-03 ENCOUNTER — Emergency Department (HOSPITAL_BASED_OUTPATIENT_CLINIC_OR_DEPARTMENT_OTHER)
Admission: EM | Admit: 2022-10-03 | Discharge: 2022-10-03 | Disposition: A | Discharge status: Home / Self Care | Payer: Medicare Other | Source: Home / Self Care | Attending: Emergency Medicine | Admitting: Emergency Medicine

## 2022-10-03 ENCOUNTER — Other Ambulatory Visit: Payer: Self-pay

## 2022-10-03 DIAGNOSIS — I132 Hypertensive heart and chronic kidney disease with heart failure and with stage 5 chronic kidney disease, or end stage renal disease: Secondary | ICD-10-CM | POA: Insufficient documentation

## 2022-10-03 DIAGNOSIS — Z992 Dependence on renal dialysis: Secondary | ICD-10-CM | POA: Insufficient documentation

## 2022-10-03 DIAGNOSIS — R531 Weakness: Secondary | ICD-10-CM | POA: Insufficient documentation

## 2022-10-03 DIAGNOSIS — E1122 Type 2 diabetes mellitus with diabetic chronic kidney disease: Secondary | ICD-10-CM | POA: Insufficient documentation

## 2022-10-03 DIAGNOSIS — N186 End stage renal disease: Secondary | ICD-10-CM | POA: Insufficient documentation

## 2022-10-03 DIAGNOSIS — Z79899 Other long term (current) drug therapy: Secondary | ICD-10-CM | POA: Insufficient documentation

## 2022-10-03 DIAGNOSIS — Z794 Long term (current) use of insulin: Secondary | ICD-10-CM | POA: Insufficient documentation

## 2022-10-03 DIAGNOSIS — I509 Heart failure, unspecified: Secondary | ICD-10-CM | POA: Insufficient documentation

## 2022-10-03 DIAGNOSIS — R110 Nausea: Secondary | ICD-10-CM | POA: Insufficient documentation

## 2022-10-03 LAB — BASIC METABOLIC PANEL
Anion gap: 9 (ref 5–15)
BUN: 34 mg/dL — ABNORMAL HIGH (ref 6–20)
CO2: 24 mmol/L (ref 22–32)
Calcium: 7.7 mg/dL — ABNORMAL LOW (ref 8.9–10.3)
Chloride: 101 mmol/L (ref 98–111)
Creatinine, Ser: 5.83 mg/dL — ABNORMAL HIGH (ref 0.44–1.00)
GFR, Estimated: 10 mL/min — ABNORMAL LOW (ref >60)
Glucose, Bld: 155 mg/dL — ABNORMAL HIGH (ref 70–99)
Potassium: 3.6 mmol/L (ref 3.5–5.1)
Sodium: 134 mmol/L — ABNORMAL LOW (ref 135–145)

## 2022-10-03 LAB — CBC
HCT: 43.9 % (ref 36.0–46.0)
Hemoglobin: 13.8 g/dL (ref 12.0–15.0)
MCH: 31.4 pg (ref 26.0–34.0)
MCHC: 31.4 g/dL (ref 30.0–36.0)
MCV: 100 fL (ref 80.0–100.0)
Platelets: 155 10*3/uL (ref 150–400)
RBC: 4.39 MIL/uL (ref 3.87–5.11)
RDW: 17 % — ABNORMAL HIGH (ref 11.5–15.5)
WBC: 5 10*3/uL (ref 4.0–10.5)
nRBC: 0 % (ref 0.0–0.2)

## 2022-10-03 LAB — CBG MONITORING, ED
Glucose-Capillary: 138 mg/dL — ABNORMAL HIGH (ref 70–99)
Glucose-Capillary: 44 mg/dL — CL (ref 70–99)
Glucose-Capillary: 172 mg/dL — ABNORMAL HIGH (ref 70–99)

## 2022-10-03 MED ORDER — METOCLOPRAMIDE HCL 5 MG/ML IJ SOLN
10.0000 mg | Freq: Once | INTRAMUSCULAR | Status: AC
  Administered 2022-10-03: 10 mg via INTRAVENOUS
  Filled 2022-10-03: qty 2

## 2022-10-03 MED ORDER — SODIUM CHLORIDE 0.9 % IV BOLUS
250.0000 mL | Freq: Once | INTRAVENOUS | Status: AC
  Administered 2022-10-03: 250 mL via INTRAVENOUS

## 2022-10-03 MED ORDER — DEXTROSE 50 % IV SOLN
1.0000 | Freq: Once | INTRAVENOUS | Status: AC
  Administered 2022-10-03: 50 mL via INTRAVENOUS

## 2022-10-03 NOTE — ED Triage Notes (Signed)
Pt c/o increasing weakness and nausea starting this morning.  Denies pain.  Pt report having a full/normal session at dialysis this morning.

## 2022-10-03 NOTE — ED Notes (Signed)
Actual wt: 60.7kg

## 2022-10-03 NOTE — ED Notes (Signed)
Arrived post dialysis, appears ill, appears weak, having back pain. Placed in exam room 4, noted to be tachycardiac and tachypenic. VS obtained, ED MD informed of pt arrival, due to hx and presenation

## 2022-10-03 NOTE — Discharge Instructions (Signed)
Follow-up with your doctor for a close recheck.  Return to the emergency room if you have any worsening symptoms.

## 2022-10-03 NOTE — ED Notes (Signed)
CBG 44mg /dl ED MD aware Cup of Apple Juice provided, GCS 15 - follows commands IV established 1 am D50 to be given IV once established

## 2022-10-03 NOTE — ED Notes (Signed)
Client requesting DC paperwork, states she is feeling better and would like to go home now, ED MD aware

## 2022-10-03 NOTE — ED Notes (Signed)
1 AMP D50 ADMINISTERED IV PER ED MD VERBAL ORDERS

## 2022-10-03 NOTE — ED Provider Notes (Signed)
[place] EMERGENCY DEPARTMENT AT [place] Provider Note   CSN: [id number] Arrival date & time: 10/03/22  1018     History  Chief Complaint  Patient presents with   Weakness   Nausea    Yisel Megill is a 27 y.o. female.  Patient is a 27 year old female with a complex medical history.  She has advanced CHF with low EF, diabetes, hypertension, bipolar disorder, hyperlipidemia.  She also has end-stage renal disease and has been on peritoneal dialysis but recently converted to hemodialysis.  She says she woke up this morning feeling little nauseated and weak.  She did go to dialysis and completed about 3 hours of dialysis on the machine.  She said she felt worse after the dialysis and feels weak, lightheaded and nauseated.  She has had no vomiting.  Her blood sugar in the ED was 44.  She denies any fevers.  No cough or cold symptoms.  No chest pain or shortness of breath.  No increased leg swelling.  She has a history of gastroparesis but she has not had any vomiting or abdominal pain.       Home Medications Prior to Admission medications   Medication Sig Start Date End Date Taking? Authorizing Provider  acetaminophen (TYLENOL) 500 MG tablet Take 1,000 mg by mouth every 6 (six) hours as needed for headache (pain).    [provider]  amitriptyline (ELAVIL) 50 MG tablet Take 1 tablet (50 mg total) by mouth at bedtime. 08/30/22   Salley Slaughter, NP  atorvastatin (LIPITOR) 40 MG tablet Take 40 mg by mouth every evening.    [provider]  calcium carbonate (TUMS - DOSED IN MG ELEMENTAL CALCIUM) 500 MG chewable tablet Take 2 tablets by mouth 3 (three) times daily with meals.    [provider]  clonazePAM (KLONOPIN) 0.5 MG tablet Take 1 tablet (0.5 mg total) by mouth 2 (two) times daily as needed for anxiety. 03/16/22   [name], MD  hydrOXYzine (ATARAX) 10 MG tablet Take 1 tablet (10 mg total) by mouth 3 (three) times daily as needed.  08/30/22   Eulis Canner E, NP  insulin lispro (HUMALOG) 100 UNIT/ML injection Inject 0.5 Units into the skin See admin instructions. 0.5 units per hour via insulin pump. 01/08/22   [provider]  LORazepam (ATIVAN) 1 MG tablet Take 0.5 tablets (0.5 mg total) by mouth 3 (three) times daily as needed. 08/11/22   Mesner, Corene Cornea, MD  magnesium oxide (MAG-OX) 400 MG tablet Take 800 mg by mouth daily.    [provider]  methocarbamol (ROBAXIN) 500 MG tablet Take 1 tablet (500 mg total) by mouth 2 (two) times daily as needed for up to 15 doses for muscle spasms. 09/06/22   Wyvonnia Dusky, MD  metoCLOPramide (REGLAN) 10 MG tablet Take 10 mg by mouth 4 (four) times daily as needed for nausea or vomiting. 08/15/22   [provider]  ondansetron (ZOFRAN) 4 MG tablet Take 4 mg by mouth as needed for nausea or vomiting.    [provider]  Vitamin D, Ergocalciferol, (DRISDOL) 1.25 MG (50000 UNIT) CAPS capsule Take 50,000 Units by mouth every Sunday.    [provider]  escitalopram (LEXAPRO) 10 MG tablet Take 20 mg by mouth daily.  05/10/20 10/06/20  [provider]  furosemide (LASIX) 40 MG tablet Take 1 tablet (40 mg total) by mouth daily. Patient not taking: Reported on 02/16/2021 10/28/19 02/17/21  [name], MD  lisinopril (ZESTRIL) 10 MG tablet Take 10 mg by mouth daily.  02/23/20 10/06/20  [provider]  spironolactone (ALDACTONE) 25 MG tablet Take 25 mg by mouth daily.  04/20/20 10/06/20  [provider]      Allergies    Cantaloupe extract allergy skin test, Strawberry extract, Citrullus vulgaris, Food, and Nsaids    Review of Systems   Review of Systems  Constitutional:  Positive for fatigue. Negative for chills, diaphoresis and fever.  HENT:  Negative for congestion, rhinorrhea and sneezing.   Eyes: Negative.   Respiratory:  Negative for cough, chest tightness and shortness of breath.   Cardiovascular:  Negative for chest  pain and leg swelling.  Gastrointestinal:  Positive for nausea. Negative for abdominal pain, blood in stool, diarrhea and vomiting.  Genitourinary:  Negative for difficulty urinating, flank pain, frequency and hematuria.  Musculoskeletal:  Negative for arthralgias and back pain.  Skin:  Negative for rash.  Neurological:  Positive for weakness (Generalized) and light-headedness. Negative for dizziness, speech difficulty, numbness and headaches.    Physical Exam Updated Vital Signs BP (!) 166/119 (BP Location: Right Arm)   Pulse (!) 112   Temp 97.6 F (36.4 C) (Oral)   Resp 16   Wt 60.7 kg   SpO2 100%   BMI 26.13 kg/m  Physical Exam Constitutional:      Appearance: She is well-developed.  HENT:     Head: Normocephalic and atraumatic.  Eyes:     Pupils: Pupils are equal, round, and reactive to light.  Cardiovascular:     Rate and Rhythm: Regular rhythm. Tachycardia present.     Heart sounds: Normal heart sounds.  Pulmonary:     Effort: Pulmonary effort is normal. No respiratory distress.     Breath sounds: Normal breath sounds. No wheezing or rales.  Chest:     Chest wall: No tenderness.  Abdominal:     General: Bowel sounds are normal.     Palpations: Abdomen is soft.     Tenderness: There is no abdominal tenderness. There is no guarding or rebound.  Musculoskeletal:        General: Normal range of motion.     Cervical back: Normal range of motion and neck supple.  Lymphadenopathy:     Cervical: No cervical adenopathy.  Skin:    General: Skin is warm and dry.     Findings: No rash.  Neurological:     Mental Status: She is alert and oriented to person, place, and time.     ED Results / Procedures / Treatments   Labs (all labs ordered are listed, but only abnormal results are displayed) Labs Reviewed  BASIC METABOLIC PANEL - Abnormal; Notable for the following components:      Result Value   Sodium 134 (*)    Glucose, Bld 155 (*)    BUN 34 (*)    Creatinine,  Ser 5.83 (*)    Calcium 7.7 (*)    GFR, Estimated 10 (*)    All other components within normal limits  CBC - Abnormal; Notable for the following components:   RDW 17.0 (*)    All other components within normal limits  CBG MONITORING, ED - Abnormal; Notable for the following components:   Glucose-Capillary 44 (*)    All other components within normal limits  CBG MONITORING, ED - Abnormal; Notable for the following components:   Glucose-Capillary 138 (*)    All other components within normal limits  CBG MONITORING, ED -  Abnormal; Notable for the following components:   Glucose-Capillary 172 (*)    All other components within normal limits    EKG EKG Interpretation  Date/Time:  Tuesday October 03 2022 10:35:28 EST Ventricular Rate:  115 PR Interval:  138 QRS Duration: 98 QT Interval:  372 QTC Calculation: 515 R Axis:   24 Text Interpretation: Sinus tachycardia Biatrial enlargement Borderline repolarization abnormality Prolonged QT interval since last tracing no significant change Confirmed by Malvin Johns [phone]) on 10/03/2022 10:47:19 AM  Radiology No results found.  Procedures Procedures    Medications Ordered in ED Medications  dextrose 50 % solution 50 mL (50 mLs Intravenous Given 10/03/22 1051)  metoCLOPramide (REGLAN) injection 10 mg (10 mg Intravenous Given 10/03/22 1107)  sodium chloride 0.9 % bolus 250 mL (0 mLs Intravenous Stopped 10/03/22 1235)    ED Course/ Medical Decision Making/ A&P                             Medical Decision Making Amount and/or Complexity of Data Reviewed Labs: ordered.  Risk Prescription drug management.   Patient is a 27 year old female who presents with fatigue and some nausea after dialysis.  She actually had some symptoms when she woke up this morning but it got worse after dialysis.  She was given a small fluid bolus of 250 cc and Reglan.  She is feeling much better after this.  She is sitting up and conversing without symptoms.   She does not have any ongoing nausea or vomiting.  She had a p.o. trial without any vomiting.  Her glucose was initially low but improved after treatment with oral fluids and IV x-rays.  It was rechecked and has remained stable.  Her labs are nonconcerning.  She is anxious to be discharged.  She still tachycardic with a heart rate around 112.  She states it is always fast and she does not want to stay for any further treatment.  On chart review, it does appear that she is frequently tachycardic during evaluations.  She was discharged home in good condition.  She was encouraged to follow-up with her doctor who primarily manages her.  Return precautions were given.  Final Clinical Impression(s) / ED Diagnoses Final diagnoses:  Weakness  Nausea    Rx / DC Orders ED Discharge Orders     None         Malvin Johns, MD 10/03/22 1257

## 2022-10-04 ENCOUNTER — Inpatient Hospital Stay (HOSPITAL_COMMUNITY): Payer: Medicare Other

## 2022-10-04 ENCOUNTER — Inpatient Hospital Stay (HOSPITAL_BASED_OUTPATIENT_CLINIC_OR_DEPARTMENT_OTHER)
Admission: EM | Admit: 2022-10-04 | Discharge: 2022-10-08 | DRG: 073 | Disposition: A | Discharge status: Home / Self Care | Payer: Medicare Other | Attending: Internal Medicine | Admitting: Internal Medicine

## 2022-10-04 ENCOUNTER — Emergency Department (HOSPITAL_BASED_OUTPATIENT_CLINIC_OR_DEPARTMENT_OTHER): Payer: Medicare Other

## 2022-10-04 ENCOUNTER — Other Ambulatory Visit: Payer: Self-pay

## 2022-10-04 ENCOUNTER — Encounter (HOSPITAL_BASED_OUTPATIENT_CLINIC_OR_DEPARTMENT_OTHER): Payer: Self-pay | Admitting: Emergency Medicine

## 2022-10-04 DIAGNOSIS — Z9641 Presence of insulin pump (external) (internal): Secondary | ICD-10-CM | POA: Diagnosis present

## 2022-10-04 DIAGNOSIS — Z888 Allergy status to other drugs, medicaments and biological substances status: Secondary | ICD-10-CM

## 2022-10-04 DIAGNOSIS — Z1152 Encounter for screening for COVID-19: Secondary | ICD-10-CM | POA: Diagnosis not present

## 2022-10-04 DIAGNOSIS — N2581 Secondary hyperparathyroidism of renal origin: Secondary | ICD-10-CM | POA: Diagnosis present

## 2022-10-04 DIAGNOSIS — E785 Hyperlipidemia, unspecified: Secondary | ICD-10-CM | POA: Diagnosis present

## 2022-10-04 DIAGNOSIS — I132 Hypertensive heart and chronic kidney disease with heart failure and with stage 5 chronic kidney disease, or end stage renal disease: Secondary | ICD-10-CM | POA: Diagnosis present

## 2022-10-04 DIAGNOSIS — F3181 Bipolar II disorder: Secondary | ICD-10-CM | POA: Diagnosis present

## 2022-10-04 DIAGNOSIS — D631 Anemia in chronic kidney disease: Secondary | ICD-10-CM | POA: Diagnosis present

## 2022-10-04 DIAGNOSIS — Z8673 Personal history of transient ischemic attack (TIA), and cerebral infarction without residual deficits: Secondary | ICD-10-CM

## 2022-10-04 DIAGNOSIS — I639 Cerebral infarction, unspecified: Secondary | ICD-10-CM | POA: Diagnosis present

## 2022-10-04 DIAGNOSIS — Z87891 Personal history of nicotine dependence: Secondary | ICD-10-CM | POA: Diagnosis not present

## 2022-10-04 DIAGNOSIS — R531 Weakness: Secondary | ICD-10-CM

## 2022-10-04 DIAGNOSIS — E875 Hyperkalemia: Secondary | ICD-10-CM | POA: Diagnosis not present

## 2022-10-04 DIAGNOSIS — I272 Pulmonary hypertension, unspecified: Secondary | ICD-10-CM | POA: Diagnosis present

## 2022-10-04 DIAGNOSIS — R000 Tachycardia, unspecified: Secondary | ICD-10-CM | POA: Diagnosis present

## 2022-10-04 DIAGNOSIS — Z992 Dependence on renal dialysis: Secondary | ICD-10-CM

## 2022-10-04 DIAGNOSIS — E1043 Type 1 diabetes mellitus with diabetic autonomic (poly)neuropathy: Principal | ICD-10-CM | POA: Diagnosis present

## 2022-10-04 DIAGNOSIS — E1065 Type 1 diabetes mellitus with hyperglycemia: Secondary | ICD-10-CM | POA: Diagnosis not present

## 2022-10-04 DIAGNOSIS — Z79899 Other long term (current) drug therapy: Secondary | ICD-10-CM

## 2022-10-04 DIAGNOSIS — I3139 Other pericardial effusion (noninflammatory): Secondary | ICD-10-CM | POA: Diagnosis present

## 2022-10-04 DIAGNOSIS — R112 Nausea with vomiting, unspecified: Secondary | ICD-10-CM | POA: Diagnosis not present

## 2022-10-04 DIAGNOSIS — I428 Other cardiomyopathies: Secondary | ICD-10-CM | POA: Diagnosis present

## 2022-10-04 DIAGNOSIS — R569 Unspecified convulsions: Secondary | ICD-10-CM | POA: Diagnosis present

## 2022-10-04 DIAGNOSIS — I10 Essential (primary) hypertension: Secondary | ICD-10-CM | POA: Diagnosis present

## 2022-10-04 DIAGNOSIS — I161 Hypertensive emergency: Secondary | ICD-10-CM | POA: Diagnosis present

## 2022-10-04 DIAGNOSIS — Z91018 Allergy to other foods: Secondary | ICD-10-CM

## 2022-10-04 DIAGNOSIS — K3184 Gastroparesis: Secondary | ICD-10-CM | POA: Diagnosis present

## 2022-10-04 DIAGNOSIS — I5022 Chronic systolic (congestive) heart failure: Secondary | ICD-10-CM | POA: Diagnosis present

## 2022-10-04 DIAGNOSIS — R680 Hypothermia, not associated with low environmental temperature: Secondary | ICD-10-CM | POA: Diagnosis present

## 2022-10-04 DIAGNOSIS — Z794 Long term (current) use of insulin: Secondary | ICD-10-CM

## 2022-10-04 DIAGNOSIS — E10649 Type 1 diabetes mellitus with hypoglycemia without coma: Secondary | ICD-10-CM | POA: Diagnosis present

## 2022-10-04 DIAGNOSIS — R011 Cardiac murmur, unspecified: Secondary | ICD-10-CM | POA: Diagnosis not present

## 2022-10-04 DIAGNOSIS — R9431 Abnormal electrocardiogram [ECG] [EKG]: Secondary | ICD-10-CM | POA: Diagnosis present

## 2022-10-04 DIAGNOSIS — N186 End stage renal disease: Secondary | ICD-10-CM | POA: Diagnosis present

## 2022-10-04 DIAGNOSIS — E1022 Type 1 diabetes mellitus with diabetic chronic kidney disease: Secondary | ICD-10-CM | POA: Diagnosis present

## 2022-10-04 LAB — I-STAT VENOUS BLOOD GAS, ED
Acid-base deficit: 2 mmol/L (ref 0.0–2.0)
Bicarbonate: 22.8 mmol/L (ref 20.0–28.0)
Calcium, Ion: 0.99 mmol/L — ABNORMAL LOW (ref 1.15–1.40)
HCT: 42 % (ref 36.0–46.0)
Hemoglobin: 14.3 g/dL (ref 12.0–15.0)
O2 Saturation: 54 %
Patient temperature: 97.5
Potassium: 4.4 mmol/L (ref 3.5–5.1)
Sodium: 136 mmol/L (ref 135–145)
TCO2: 24 mmol/L (ref 22–32)
pCO2, Ven: 36 mmHg — ABNORMAL LOW (ref 44–60)
pH, Ven: 7.407 (ref 7.25–7.43)
pO2, Ven: 27 mmHg — CL (ref 32–45)

## 2022-10-04 LAB — CBC WITH DIFFERENTIAL/PLATELET
Abs Immature Granulocytes: 0.01 10*3/uL (ref 0.00–0.07)
Basophils Absolute: 0.1 10*3/uL (ref 0.0–0.1)
Basophils Relative: 2 %
Eosinophils Absolute: 0.1 10*3/uL (ref 0.0–0.5)
Eosinophils Relative: 3 %
HCT: 40.7 % (ref 36.0–46.0)
Hemoglobin: 12.5 g/dL (ref 12.0–15.0)
Immature Granulocytes: 0 %
Lymphocytes Relative: 18 %
Lymphs Abs: 0.8 10*3/uL (ref 0.7–4.0)
MCH: 31.3 pg (ref 26.0–34.0)
MCHC: 30.7 g/dL (ref 30.0–36.0)
MCV: 102 fL — ABNORMAL HIGH (ref 80.0–100.0)
Monocytes Absolute: 0.2 10*3/uL (ref 0.1–1.0)
Monocytes Relative: 6 %
Neutro Abs: 3 10*3/uL (ref 1.7–7.7)
Neutrophils Relative %: 71 %
Platelets: 172 10*3/uL (ref 150–400)
RBC: 3.99 MIL/uL (ref 3.87–5.11)
RDW: 17 % — ABNORMAL HIGH (ref 11.5–15.5)
WBC: 4.2 10*3/uL (ref 4.0–10.5)
nRBC: 0 % (ref 0.0–0.2)

## 2022-10-04 LAB — RESP PANEL BY RT-PCR (RSV, FLU A&B, COVID)  RVPGX2
Influenza A by PCR: NEGATIVE
Influenza B by PCR: NEGATIVE
Resp Syncytial Virus by PCR: NEGATIVE
SARS Coronavirus 2 by RT PCR: NEGATIVE

## 2022-10-04 LAB — BASIC METABOLIC PANEL
Anion gap: 14 (ref 5–15)
BUN: 39 mg/dL — ABNORMAL HIGH (ref 6–20)
CO2: 21 mmol/L — ABNORMAL LOW (ref 22–32)
Calcium: 8.5 mg/dL — ABNORMAL LOW (ref 8.9–10.3)
Chloride: 101 mmol/L (ref 98–111)
Creatinine, Ser: 7.04 mg/dL — ABNORMAL HIGH (ref 0.44–1.00)
GFR, Estimated: 8 mL/min — ABNORMAL LOW (ref >60)
Glucose, Bld: 169 mg/dL — ABNORMAL HIGH (ref 70–99)
Potassium: 4.4 mmol/L (ref 3.5–5.1)
Sodium: 136 mmol/L (ref 135–145)

## 2022-10-04 LAB — GLUCOSE, CAPILLARY
Glucose-Capillary: 173 mg/dL — ABNORMAL HIGH (ref 70–99)
Glucose-Capillary: 100 mg/dL — ABNORMAL HIGH (ref 70–99)
Glucose-Capillary: 75 mg/dL (ref 70–99)
Glucose-Capillary: 184 mg/dL — ABNORMAL HIGH (ref 70–99)
Glucose-Capillary: 53 mg/dL — ABNORMAL LOW (ref 70–99)
Glucose-Capillary: 160 mg/dL — ABNORMAL HIGH (ref 70–99)

## 2022-10-04 LAB — MAGNESIUM: Magnesium: 2 mg/dL (ref 1.7–2.4)

## 2022-10-04 LAB — HCG, QUANTITATIVE, PREGNANCY: hCG, Beta Chain, Quant, S: 3 m[IU]/mL (ref <5)

## 2022-10-04 LAB — CBG MONITORING, ED
Glucose-Capillary: 146 mg/dL — ABNORMAL HIGH (ref 70–99)
Glucose-Capillary: 182 mg/dL — ABNORMAL HIGH (ref 70–99)
Glucose-Capillary: 172 mg/dL — ABNORMAL HIGH (ref 70–99)

## 2022-10-04 LAB — MRSA NEXT GEN BY PCR, NASAL: MRSA by PCR Next Gen: NOT DETECTED

## 2022-10-04 MED ORDER — LORAZEPAM 2 MG/ML IJ SOLN
INTRAMUSCULAR | Status: AC
  Administered 2022-10-04: 2 mg
  Filled 2022-10-04: qty 1

## 2022-10-04 MED ORDER — CLONAZEPAM 0.5 MG PO TABS
0.5000 mg | ORAL_TABLET | Freq: Two times a day (BID) | ORAL | Status: DC | PRN
  Administered 2022-10-04: 0.5 mg via ORAL
  Filled 2022-10-04: qty 1

## 2022-10-04 MED ORDER — INSULIN DETEMIR 100 UNIT/ML ~~LOC~~ SOLN
5.0000 [IU] | Freq: Every day | SUBCUTANEOUS | Status: DC
  Administered 2022-10-05 – 2022-10-06 (×2): 5 [IU] via SUBCUTANEOUS
  Filled 2022-10-04 (×2): qty 0.05

## 2022-10-04 MED ORDER — DOCUSATE SODIUM 283 MG RE ENEM
1.0000 | ENEMA | RECTAL | Status: DC | PRN
  Filled 2022-10-04: qty 1

## 2022-10-04 MED ORDER — LORAZEPAM 2 MG/ML IJ SOLN: 2.0000 mg | Freq: Once | INTRAMUSCULAR | Status: DC

## 2022-10-04 MED ORDER — HEPARIN SODIUM (PORCINE) 5000 UNIT/ML IJ SOLN
5000.0000 [IU] | Freq: Three times a day (TID) | INTRAMUSCULAR | Status: DC
  Administered 2022-10-04 – 2022-10-08 (×12): 5000 [IU] via SUBCUTANEOUS
  Filled 2022-10-04 (×12): qty 1

## 2022-10-04 MED ORDER — CHLORHEXIDINE GLUCONATE CLOTH 2 % EX PADS
6.0000 | MEDICATED_PAD | Freq: Every day | CUTANEOUS | Status: DC
  Administered 2022-10-05 – 2022-10-07 (×3): 6 via TOPICAL

## 2022-10-04 MED ORDER — AMITRIPTYLINE HCL 25 MG PO TABS
50.0000 mg | ORAL_TABLET | Freq: Every day | ORAL | Status: DC
  Administered 2022-10-04 – 2022-10-07 (×4): 50 mg via ORAL
  Filled 2022-10-04: qty 2
  Filled 2022-10-04: qty 1
  Filled 2022-10-04 (×3): qty 2

## 2022-10-04 MED ORDER — CALCIUM CARBONATE ANTACID 1250 MG/5ML PO SUSP
500.0000 mg | Freq: Four times a day (QID) | ORAL | Status: DC | PRN
  Filled 2022-10-04: qty 5

## 2022-10-04 MED ORDER — DEXTROSE 50 % IV SOLN
25.0000 g | INTRAVENOUS | Status: AC
  Administered 2022-10-04: 25 g via INTRAVENOUS
  Filled 2022-10-04: qty 50

## 2022-10-04 MED ORDER — INSULIN DETEMIR 100 UNIT/ML ~~LOC~~ SOLN
8.0000 [IU] | Freq: Every day | SUBCUTANEOUS | Status: DC
  Administered 2022-10-04: 8 [IU] via SUBCUTANEOUS
  Filled 2022-10-04 (×2): qty 0.08

## 2022-10-04 MED ORDER — CLEVIDIPINE BUTYRATE 0.5 MG/ML IV EMUL
0.0000 mg/h | INTRAVENOUS | Status: DC
  Administered 2022-10-04 – 2022-10-05 (×2): 2 mg/h via INTRAVENOUS
  Filled 2022-10-04: qty 50
  Filled 2022-10-04: qty 100

## 2022-10-04 MED ORDER — ACETAMINOPHEN 650 MG RE SUPP: 650.0000 mg | Freq: Four times a day (QID) | RECTAL | Status: DC | PRN

## 2022-10-04 MED ORDER — HYDRALAZINE HCL 20 MG/ML IJ SOLN
5.0000 mg | INTRAMUSCULAR | Status: DC | PRN
  Administered 2022-10-05: 5 mg via INTRAVENOUS
  Filled 2022-10-04 (×2): qty 1

## 2022-10-04 MED ORDER — HYDROXYZINE HCL 10 MG PO TABS
10.0000 mg | ORAL_TABLET | Freq: Three times a day (TID) | ORAL | Status: DC | PRN
  Administered 2022-10-04 – 2022-10-05 (×2): 10 mg via ORAL
  Filled 2022-10-04 (×2): qty 1

## 2022-10-04 MED ORDER — SORBITOL 70 % SOLN: 30.0000 mL | Status: DC | PRN

## 2022-10-04 MED ORDER — SODIUM CHLORIDE 0.9 % IV BOLUS
250.0000 mL | Freq: Once | INTRAVENOUS | Status: AC
  Administered 2022-10-04: 250 mL via INTRAVENOUS

## 2022-10-04 MED ORDER — INSULIN ASPART 100 UNIT/ML IJ SOLN: 0.0000 [IU] | Freq: Every day | INTRAMUSCULAR | Status: DC

## 2022-10-04 MED ORDER — SODIUM CHLORIDE 0.9 % IV SOLN
12.5000 mg | Freq: Once | INTRAVENOUS | Status: DC
  Filled 2022-10-04: qty 0.5

## 2022-10-04 MED ORDER — PROCHLORPERAZINE EDISYLATE 10 MG/2ML IJ SOLN: 10.0000 mg | INTRAMUSCULAR | Status: DC | PRN

## 2022-10-04 MED ORDER — SODIUM CHLORIDE 0.9 % IV SOLN
12.5000 mg | Freq: Once | INTRAVENOUS | Status: AC
  Administered 2022-10-04: 12.5 mg via INTRAVENOUS
  Filled 2022-10-04: qty 0.5

## 2022-10-04 MED ORDER — NEPRO/CARBSTEADY PO LIQD
237.0000 mL | Freq: Three times a day (TID) | ORAL | Status: DC | PRN
  Filled 2022-10-04: qty 237

## 2022-10-04 MED ORDER — INSULIN ASPART 100 UNIT/ML IJ SOLN
0.0000 [IU] | Freq: Three times a day (TID) | INTRAMUSCULAR | Status: DC
  Administered 2022-10-04: 1 [IU] via SUBCUTANEOUS

## 2022-10-04 MED ORDER — INSULIN ASPART 100 UNIT/ML IJ SOLN
0.0000 [IU] | Freq: Three times a day (TID) | INTRAMUSCULAR | Status: DC
  Administered 2022-10-05 – 2022-10-06 (×2): 2 [IU] via SUBCUTANEOUS

## 2022-10-04 MED ORDER — ATORVASTATIN CALCIUM 40 MG PO TABS
40.0000 mg | ORAL_TABLET | Freq: Every evening | ORAL | Status: DC
  Administered 2022-10-05 – 2022-10-07 (×3): 40 mg via ORAL
  Filled 2022-10-04 (×3): qty 1

## 2022-10-04 MED ORDER — INSULIN ASPART 100 UNIT/ML IJ SOLN: 2.0000 [IU] | Freq: Three times a day (TID) | INTRAMUSCULAR | Status: DC

## 2022-10-04 MED ORDER — PROMETHAZINE HCL 25 MG/ML IJ SOLN
INTRAMUSCULAR | Status: AC
  Filled 2022-10-04: qty 1

## 2022-10-04 MED ORDER — GADOBUTROL 1 MMOL/ML IV SOLN
6.0000 mL | Freq: Once | INTRAVENOUS | Status: AC | PRN
  Administered 2022-10-04: 6 mL via INTRAVENOUS

## 2022-10-04 MED ORDER — ACETAMINOPHEN 325 MG PO TABS
650.0000 mg | ORAL_TABLET | Freq: Four times a day (QID) | ORAL | Status: DC | PRN
  Administered 2022-10-05: 650 mg via ORAL
  Filled 2022-10-04: qty 2

## 2022-10-04 MED ORDER — SODIUM CHLORIDE 0.9% FLUSH
3.0000 mL | Freq: Two times a day (BID) | INTRAVENOUS | Status: DC
  Administered 2022-10-04 – 2022-10-08 (×9): 3 mL via INTRAVENOUS
  Filled 2022-10-04: qty 3

## 2022-10-04 MED ORDER — ORAL CARE MOUTH RINSE: 15.0000 mL | OROMUCOSAL | Status: DC | PRN

## 2022-10-04 NOTE — Consult Note (Addendum)
Renal Service Consult Note [facility]  Lirio Bach 10/04/2022 [name], MD Requesting Physician: Dr. Lorin Mercy  Reason for Consult: ESRD pt w/ AMS HPI: The patient is a 27 y.o. year-old w/ PMH as below who presented to ED w/ N/V and lightheadedness. In ED pt was somnolent, lethargic, hypothermic and a Bair hugger was used. Pt was sent from ED at [facility] for admission at [facility]. In ED had seizure-like activity and was given Ativan. Pt admitted to 5W and we are asked to see for dialysis.   Pt seen. She had outpt HD yesterday but didn't feel well afterwards. Pt is falling asleep in conversation. Denies any CP or SOB or cough or fevers. Lives w/ her 2 yr old daughter. Main c/o's are feeling tired, nausea.    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 In ED BP's high, then dropped low and moved to ICU, then BP stabilized. TDC placed and pt was started on iHD and PD was stopped.    ROS - denies CP, no joint pain, no HA, no blurry vision, no rash, no diarrhea, no nausea/ vomiting, no dysuria, no difficulty voiding   Past Medical History  Past Medical History:  Diagnosis Date   Anemia    Anxiety    Bipolar 2 disorder (HCC)    Chronic kidney disease    Chronic systolic (congestive) heart failure (HCC)    Depression    DKA (diabetic ketoacidoses)    ESRD on peritoneal dialysis ([place])    HSV infection    on valtrex   Hypokalemia    Leukocytosis    Migraine    Noncompliance with medication regimen    Preeclampsia    Prolonged QT syndrome    Severe anemia    Type 1 diabetes mellitus ([place])    Past Surgical History  Past Surgical History:  Procedure Laterality Date   BIOPSY  04/24/2022   Procedure: BIOPSY;  Surgeon: [name], MD;  Location: [place];  Service: Gastroenterology;;   CARDIAC CATHETERIZATION     COLONOSCOPY WITH PROPOFOL N/A 04/24/2022   Procedure: COLONOSCOPY WITH PROPOFOL;  Surgeon: [name], MD;  Location: [facility] ENDOSCOPY;  Service: Gastroenterology;  Laterality: N/A;   DILATION AND EVACUATION N/A 10/22/2019   Procedure: ULTRASOUND GUIDED DILATATION AND EVACUATION;  Surgeon: [name], MD;  [facility];  Service: Gynecology;  Laterality: N/A;   ESOPHAGOGASTRODUODENOSCOPY (EGD) WITH PROPOFOL N/A 04/24/2022   Procedure: ESOPHAGOGASTRODUODENOSCOPY (EGD) WITH PROPOFOL;  Surgeon: [name], MD;  Location: [place];  Service: Gastroenterology;  Laterality: N/A;   IR FLUORO GUIDE CV LINE RIGHT  09/14/2022   IR US GUIDE VASC ACCESS RIGHT  09/14/2022   peritoneal dialysis catheter insertion     RENAL BIOPSY     Family History  Family History  Adopted: Yes  Problem Relation Age of Onset   Heart disease Neg Hx    Social History  reports that she has quit smoking. Her smoking use included cigars and cigarettes. She has a 2.00 pack-year smoking history. She has never used smokeless tobacco. She reports that she does not drink alcohol and does not use drugs. Allergies  Allergies  Allergen Reactions   Cantaloupe Extract Allergy Skin Test Itching    Mouth  itching     Strawberry Extract Itching    Mouth itching   Citrullus Vulgaris Itching    Mouth itching    Food Itching    All melon - mouth itching   Nsaids Other (See Comments)    Avoid per nephrology    Home medications Prior to Admission medications   Medication Sig Start Date End Date Taking? Authorizing Provider  calcium acetate (PHOSLO) 667 MG capsule Take by mouth. 09/28/22  Yes [provider]  acetaminophen (TYLENOL) 500 MG tablet Take 1,000 mg by mouth every 6 (six) hours as needed for headache (pain).    [provider]  amitriptyline (ELAVIL) 50 MG tablet Take 1 tablet (50 mg total) by mouth at  bedtime. 08/30/22   Salley Slaughter, NP  atorvastatin (LIPITOR) 40 MG tablet Take 40 mg by mouth every evening.    [provider]  calcium carbonate (TUMS - DOSED IN MG ELEMENTAL CALCIUM) 500 MG chewable tablet Take 2 tablets by mouth 3 (three) times daily with meals.    [provider]  clonazePAM (KLONOPIN) 0.5 MG tablet Take 1 tablet (0.5 mg total) by mouth 2 (two) times daily as needed for anxiety. 03/16/22   [name], MD  hydrOXYzine (ATARAX) 10 MG tablet Take 1 tablet (10 mg total) by mouth 3 (three) times daily as needed. 08/30/22   Eulis Canner E, NP  insulin lispro (HUMALOG) 100 UNIT/ML injection Inject 0.5 Units into the skin See admin instructions. 0.5 units per hour via insulin pump. 01/08/22   [provider]  LORazepam (ATIVAN) 1 MG tablet Take 0.5 tablets (0.5 mg total) by mouth 3 (three) times daily as needed. 08/11/22   Mesner, Corene Cornea, MD  magnesium oxide (MAG-OX) 400 MG tablet Take 800 mg by mouth daily.    [provider]  methocarbamol (ROBAXIN) 500 MG tablet Take 1 tablet (500 mg total) by mouth 2 (two) times daily as needed for up to 15 doses for muscle spasms. 09/06/22   Wyvonnia Dusky, MD  metoCLOPramide (REGLAN) 10 MG tablet Take 10 mg by mouth 4 (four) times daily as needed for nausea or vomiting. 08/15/22   [provider]  ondansetron (ZOFRAN) 4 MG tablet Take 4 mg by mouth as needed for nausea or vomiting.    [provider]  Vitamin D, Ergocalciferol, (DRISDOL) 1.25 MG (50000 UNIT) CAPS capsule Take 50,000 Units by mouth every Sunday.    [provider]  escitalopram (LEXAPRO) 10 MG tablet Take 20 mg by mouth daily.  05/10/20 10/06/20  [provider]  furosemide (LASIX) 40 MG tablet Take 1 tablet (40 mg total) by mouth daily. Patient not taking: Reported on 02/16/2021 10/28/19 02/17/21  [name], MD  lisinopril (ZESTRIL) 10 MG tablet Take 10 mg by mouth daily.  02/23/20 10/06/20  [provider]  spironolactone (ALDACTONE) 25 MG tablet Take 25 mg by mouth daily.  04/20/20 10/06/20  [provider]     Vitals:   10/04/22 1100 10/04/22 1115 10/04/22 1130 10/04/22 1310  BP: (!) 154/131  (!) 154/126 (!) 153/132  Pulse: (!) 128 (!) 126 (!) 127 (!) 128  Resp: (!) 22  20 20   Temp:   (!) 97.1 F (36.2 C) 97.7 F (36.5 C)  TempSrc:    Oral  SpO2: 97% 100% 100% 95%  Weight:      Height:       Exam Gen very lethargic, struggles to stay awake, no asterixis No rash,  cyanosis or gangrene Sclera anicteric, throat clear  No jvd or bruits Chest clear bilat to bases, no rales/ wheezing RRR quite tachy w/ loud murmurs syst and diast, possible S3 as well Abd soft ntnd no mass or ascites +bs GU defer MS no joint effusions or deformity Ext no LE or UE edema, no wounds or ulcers Neuro is alert, Ox 3 , nf    RIJ TDC    Home meds include - elavil, lipitor, klonopin prn, tums, humalog insulin, ativan prn, reglan 10mg  qid prn, prns/ vits/ supps     OP HD: [facility] Dr Olivia Mackie  3.5h  58kg   Hep 2000 + 500u/hr  TDC RIJ   Assessment/ Plan: Nausea/ vomiting - hx of same in the past, per pmd Somnolence - is not disoriented but very lethargic, hard to keep awake Sinus tachycardia - not sure cause, no fever, not vol overloaded, lungs clear and no edema.  ESRD - on HD TTS, last HD was yesterday. Plan HD tomorrow.  HTN/ volume - 2kg up, euvolemic on exam, BP's reading very high DBP's and just a bit high SBP's.  Will go back and do a manual BP.  Anemia esrd - Hb 12-14, no esa needs. Could be dehydrated w/ hemoconcentration. Will get OP records.  MBD ckd - Ca low, get alb and phos added on.  IDDM - type 1 diabetic   Addendum 4:30 pm --> went back to check her BP's manually and all I could hear was about 145/ 135 in the R arm.  They said all her BP meds were stopped on her most recent hospital stay.  Have d/w pmd, CCM will be consulting also. May have to consider  something like Cardene or Cleviprex by infusion, and/or resume some of her prior BP meds.    Rob Allisen Pidgeon  MD 10/04/2022, 3:50 PM Recent Labs  Lab 10/03/22 1142 10/04/22 0303 10/04/22 0322 10/04/22 0326  HGB 13.8 12.5  --  14.3  CALCIUM 7.7*  --  8.5*  --   CREATININE 5.83*  --  7.04*  --   K 3.6  --  4.4 4.4   Inpatient medications:  amitriptyline  50 mg Oral QHS   atorvastatin  40 mg Oral QPM   heparin  5,000 Units Subcutaneous Q8H   insulin aspart  0-5 Units Subcutaneous QHS   insulin aspart  0-6 Units Subcutaneous TID WC   insulin aspart  2 Units Subcutaneous TID WC   insulin detemir  8 Units Subcutaneous Daily   sodium chloride flush  3 mL Intravenous Q12H    acetaminophen **OR** acetaminophen, calcium carbonate (dosed in mg elemental calcium), clonazePAM, docusate sodium, feeding supplement (NEPRO CARB STEADY), hydrALAZINE, hydrOXYzine, sorbitol

## 2022-10-04 NOTE — ED Notes (Signed)
Hospitalist at bedside 

## 2022-10-04 NOTE — ED Notes (Signed)
Pt's sister came out of room stating that she thought the pt was having a seizure. This RN, 2 other RNs, and the EDP entered the room to the pt twitching but able to answer her name being called. Pt placed on 2lpm [place]. 2nd IV placed.   Pt was given 2mg  Ativan IV for seizure like activity, per EDP VO, see MAR.

## 2022-10-04 NOTE — ED Provider Notes (Signed)
[place] DEPT [facility] Provider Note: [name], MD, FACEP  CSN: [id number] MRN: [id number] ARRIVAL: 10/04/22 at [place]: [place]  Vomiting   HISTORY OF PRESENT ILLNESS  10/04/22 3:00 AM Kathy Lewis is a 27 y.o. female with CHF and low ejection fraction, diabetes, hypertension, and end-stage renal disease previously on peritoneal dialysis but recently starting hemodialysis.  She was seen in the ED yesterday for nausea and weakness.  She went to dialysis and went to dialysis but did not complete her dialysis treatment.  After that she felt worse.  In the ED she was found to be hypoglycemic and was given D50 as well as a small IV fluid bolus and IV Reglan.  At that time she had had no vomiting.  Since being discharged from the ED she has started vomiting, feels lightheaded and is having generalized weakness.  She denies fever, chest pain, shortness of breath, abdominal pain or diarrhea.   Past Medical History:  Diagnosis Date   Anemia    Anxiety    Bipolar 2 disorder (HCC)    Chronic kidney disease    Chronic systolic (congestive) heart failure (HCC)    Depression    DKA (diabetic ketoacidoses)    ESRD on peritoneal dialysis ([place])    HSV infection    on valtrex   Hypokalemia    Leukocytosis    Migraine    Noncompliance with medication regimen    Preeclampsia    Prolonged QT syndrome    Severe anemia    Type 1 diabetes mellitus ([place])     Past Surgical History:  Procedure Laterality Date   BIOPSY  04/24/2022   Procedure: BIOPSY;  Surgeon: [name], MD;  Location: [place];  Service: Gastroenterology;;   CARDIAC CATHETERIZATION     COLONOSCOPY WITH PROPOFOL N/A 04/24/2022   Procedure: COLONOSCOPY WITH PROPOFOL;  Surgeon: [name], MD;  Location: [place];  Service: Gastroenterology;  Laterality: N/A;   DILATION AND EVACUATION N/A 10/22/2019   Procedure: ULTRASOUND GUIDED DILATATION AND EVACUATION;  Surgeon: [name], MD;  [facility];  Service: Gynecology;  Laterality: N/A;   ESOPHAGOGASTRODUODENOSCOPY (EGD) WITH PROPOFOL N/A 04/24/2022   Procedure: ESOPHAGOGASTRODUODENOSCOPY (EGD) WITH PROPOFOL;  Surgeon: [name], MD;  Location: [place];  Service: Gastroenterology;  Laterality: N/A;   IR FLUORO GUIDE CV LINE RIGHT  09/14/2022   IR US GUIDE VASC ACCESS RIGHT  09/14/2022   NO PAST SURGERIES     peritoneal dialysis catheter insertion     RENAL BIOPSY      Family History  Adopted: Yes  Problem Relation Age of Onset   Heart disease Neg Hx     Social History   Tobacco Use   Smoking status: Former    Packs/day: 1.00    Years: 2.00    Total pack years: 2.00    Types: Cigars, Cigarettes   Smokeless tobacco: Never  Vaping Use   Vaping Use: Never used  Substance Use Topics   Alcohol use: No   Drug use: No    Prior to Admission medications   Medication Sig Start Date End Date Taking? Authorizing Provider  acetaminophen (TYLENOL) 500 MG tablet Take 1,000 mg by mouth every 6 (six) hours as needed for headache (pain).    [provider]  amitriptyline (ELAVIL) 50 MG tablet Take 1 tablet (50 mg total) by mouth at bedtime. 08/30/22   Salley Slaughter, NP  atorvastatin (LIPITOR) 40 MG  tablet Take 40 mg by mouth every evening.    [provider]  calcium carbonate (TUMS - DOSED IN MG ELEMENTAL CALCIUM) 500 MG chewable tablet Take 2 tablets by mouth 3 (three) times daily with meals.    [provider]  clonazePAM (KLONOPIN) 0.5 MG tablet Take 1 tablet (0.5 mg total) by mouth 2 (two) times daily as needed for anxiety. 03/16/22   [name], MD  hydrOXYzine (ATARAX) 10 MG tablet Take 1 tablet (10 mg total) by mouth 3 (three) times daily as needed. 08/30/22   Eulis Canner E, NP  insulin lispro (HUMALOG) 100 UNIT/ML injection Inject 0.5 Units into the skin See admin instructions. 0.5 units per hour via insulin pump. 01/08/22   [provider]  LORazepam (ATIVAN) 1 MG tablet Take 0.5 tablets (0.5 mg total) by mouth 3 (three) times daily as needed. 08/11/22   Mesner, Corene Cornea, MD  magnesium oxide (MAG-OX) 400 MG tablet Take 800 mg by mouth daily.    [provider]  methocarbamol (ROBAXIN) 500 MG tablet Take 1 tablet (500 mg total) by mouth 2 (two) times daily as needed for up to 15 doses for muscle spasms. 09/06/22   Wyvonnia Dusky, MD  metoCLOPramide (REGLAN) 10 MG tablet Take 10 mg by mouth 4 (four) times daily as needed for nausea or vomiting. 08/15/22   [provider]  ondansetron (ZOFRAN) 4 MG tablet Take 4 mg by mouth as needed for nausea or vomiting.    [provider]  Vitamin D, Ergocalciferol, (DRISDOL) 1.25 MG (50000 UNIT) CAPS capsule Take 50,000 Units by mouth every Sunday.    [provider]  escitalopram (LEXAPRO) 10 MG tablet Take 20 mg by mouth daily.  05/10/20 10/06/20  [provider]  furosemide (LASIX) 40 MG tablet Take 1 tablet (40 mg total) by mouth daily. Patient not taking: Reported on 02/16/2021 10/28/19 02/17/21  [name], MD  lisinopril (ZESTRIL) 10 MG tablet Take 10 mg by mouth daily.  02/23/20 10/06/20  [provider]  spironolactone (ALDACTONE) 25 MG tablet Take 25 mg by mouth daily.  04/20/20 10/06/20  [provider]    Allergies Cantaloupe extract allergy skin test, Strawberry extract, Citrullus vulgaris, Food, and Nsaids   REVIEW OF SYSTEMS  Negative except as noted here or in the History of Present Illness.   PHYSICAL EXAMINATION  Initial Vital Signs Blood pressure (!) 164/135, pulse (!) 119, temperature (!) 97.5 F (36.4 C), temperature source Oral, resp. rate (!) 28, height 5' (1.524 m), weight 60.7 kg, SpO2 100 %.  Examination General: Well-developed, well-nourished female in no acute distress; appearance consistent with age of record HENT: normocephalic; atraumatic Eyes: pupils equal, round and reactive to light;  extraocular muscles intact Neck: supple Heart: regular rate and rhythm; tachycardia Lungs: clear to auscultation bilaterally Chest: Port-A-Cath right upper quadrant Abdomen: soft; nondistended; nontender; bowel sounds present Extremities: No deformity; full range of motion; pulses normal Neurologic: Awake, alert and oriented; motor function intact in all extremities and symmetric; no facial droop; generalized weakness Skin: Warm and dry Psychiatric: Flat affect; weak voice   RESULTS  Summary of this visit's results, reviewed and interpreted by myself:   EKG Interpretation  Date/Time:    Ventricular Rate:    PR Interval:    QRS Duration:   QT Interval:    QTC Calculation:   R Axis:     Text Interpretation:         Laboratory Studies: Results for orders  placed or performed during the hospital encounter of 10/04/22 (from the past 24 hour(s))  CBG monitoring, ED     Status: Abnormal   Collection Time: 10/04/22  3:01 AM  Result Value Ref Range   Glucose-Capillary 146 (H) 70 - 99 mg/dL  CBC with Differential     Status: Abnormal   Collection Time: 10/04/22  3:03 AM  Result Value Ref Range   WBC 4.2 4.0 - 10.5 K/uL   RBC 3.99 3.87 - 5.11 MIL/uL   Hemoglobin 12.5 12.0 - 15.0 g/dL   HCT 40.7 36.0 - 46.0 %   MCV 102.0 (H) 80.0 - 100.0 fL   MCH 31.3 26.0 - 34.0 pg   MCHC 30.7 30.0 - 36.0 g/dL   RDW 17.0 (H) 11.5 - 15.5 %   Platelets 172 150 - 400 K/uL   nRBC 0.0 0.0 - 0.2 %   Neutrophils Relative % 71 %   Neutro Abs 3.0 1.7 - 7.7 K/uL   Lymphocytes Relative 18 %   Lymphs Abs 0.8 0.7 - 4.0 K/uL   Monocytes Relative 6 %   Monocytes Absolute 0.2 0.1 - 1.0 K/uL   Eosinophils Relative 3 %   Eosinophils Absolute 0.1 0.0 - 0.5 K/uL   Basophils Relative 2 %   Basophils Absolute 0.1 0.0 - 0.1 K/uL   Immature Granulocytes 0 %   Abs Immature Granulocytes 0.01 0.00 - 0.07 K/uL  Magnesium     Status: None   Collection Time: 10/04/22  3:15 AM  Result Value Ref Range   Magnesium  2.0 1.7 - 2.4 mg/dL  Basic metabolic panel     Status: Abnormal   Collection Time: 10/04/22  3:22 AM  Result Value Ref Range   Sodium 136 135 - 145 mmol/L   Potassium 4.4 3.5 - 5.1 mmol/L   Chloride 101 98 - 111 mmol/L   CO2 21 (L) 22 - 32 mmol/L   Glucose, Bld 169 (H) 70 - 99 mg/dL   BUN 39 (H) 6 - 20 mg/dL   Creatinine, Ser 7.04 (H) 0.44 - 1.00 mg/dL   Calcium 8.5 (L) 8.9 - 10.3 mg/dL   GFR, Estimated 8 (L) >60 mL/min   Anion gap 14 5 - 15  hCG, quantitative, pregnancy     Status: None   Collection Time: 10/04/22  3:22 AM  Result Value Ref Range   hCG, Beta Chain, Quant, S 3 <5 mIU/mL  Resp panel by RT-PCR (RSV, Flu A&B, Covid) Anterior Nasal Swab     Status: None   Collection Time: 10/04/22  3:25 AM   Specimen: Anterior Nasal Swab  Result Value Ref Range   SARS Coronavirus 2 by RT PCR NEGATIVE NEGATIVE   Influenza A by PCR NEGATIVE NEGATIVE   Influenza B by PCR NEGATIVE NEGATIVE   Resp Syncytial Virus by PCR NEGATIVE NEGATIVE  I-Stat venous blood gas, ([facility] ED, [facility], [facility])     Status: Abnormal   Collection Time: 10/04/22  3:26 AM  Result Value Ref Range   pH, Ven 7.407 7.25 - 7.43   pCO2, Ven 36.0 (L) 44 - 60 mmHg   pO2, Ven 27 (LL) 32 - 45 mmHg   Bicarbonate 22.8 20.0 - 28.0 mmol/L   TCO2 24 22 - 32 mmol/L   O2 Saturation 54 %   Acid-base deficit 2.0 0.0 - 2.0 mmol/L   Sodium 136 135 - 145 mmol/L   Potassium 4.4 3.5 - 5.1 mmol/L   Calcium, Ion 0.99 (L) 1.15 - 1.40  mmol/L   HCT 42.0 36.0 - 46.0 %   Hemoglobin 14.3 12.0 - 15.0 g/dL   Patient temperature 97.5 F    Sample type VENOUS    Comment NOTIFIED PHYSICIAN   CBG monitoring, ED     Status: Abnormal   Collection Time: 10/04/22  5:39 AM  Result Value Ref Range   Glucose-Capillary 182 (H) 70 - 99 mg/dL   Imaging Studies: CT Head Wo Contrast  Result Date: 10/04/2022 CLINICAL DATA:  27 year old female with seizure-like activity, nausea vomiting. Type 1 diabetes. EXAM: CT HEAD WITHOUT CONTRAST TECHNIQUE: Contiguous  axial images were obtained from the base of the skull through the vertex without intravenous contrast. RADIATION DOSE REDUCTION: This exam was performed according to the departmental dose-optimization program which includes automated exposure control, adjustment of the mA and/or kV according to patient size and/or use of iterative reconstruction technique. COMPARISON:  None Available. FINDINGS: Brain: No midline shift, ventriculomegaly, mass effect, evidence of mass lesion, intracranial hemorrhage or evidence of cortically based acute infarction. Small cavum septum pellucidum, normal variant. Small chronic appearing, circumscribed infarct of the right superior cerebellar artery territory (coronal image 23). No cerebral cortical encephalomalacia identified and elsewhere gray-white differentiation is within normal limits. Vascular: Very faint Calcified atherosclerosis at the skull base. Skull: Negative. Sinuses/Orbits: Visualized paranasal sinuses and mastoids are clear. Other: Visualized orbits and scalp soft tissues are within normal limits. IMPRESSION: 1. 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 2. No acute intracranial abnormality. And otherwise negative noncontrast CT appearance of the brain. Electronically Signed   By: Genevie Ann M.D.   On: 10/04/2022 05:08    ED COURSE and MDM  Nursing notes, initial and subsequent vitals signs, including pulse oximetry, reviewed and interpreted by myself.  Vitals:   10/04/22 0600 10/04/22 0615 10/04/22 0630 10/04/22 0700  BP: (!) 162/137 (!) 152/131 (!) 160/134 (!) 149/130  Pulse: (!) 125 (!) 122 (!) 123   Resp: (!) 22 (!) 24 (!) 24 (!) 26  Temp:      TempSrc:      SpO2: 100% 100% 100%   Weight:      Height:       Medications  sodium chloride 0.9 % bolus 250 mL (0 mLs Intravenous Stopped 10/04/22 0411)  LORazepam (ATIVAN) 2 MG/ML injection (2 mg  Given 10/04/22 0331)  promethazine (PHENERGAN) 12.5 mg in sodium chloride 0.9 % 50 mL IVPB (0 mg  Intravenous Stopped 10/04/22 0713)  promethazine (PHENERGAN) 25 MG/ML injection (  Given 10/04/22 0655)   3:20 AM Patient exhibited generalized tonic-clonic appearing activity.  However, her eyes remained open and she was still able to answer questions.  It is unclear if this was a seizure but we will administer 2 mg of lorazepam IV.  Lorazepam also has antinausea properties and, unlike most antinauseants, does not prolong the QT (the patient's EKG, which did not crossover into this note, show significant QT prolongation which had been noted on previous EKGs).  Blood sugar is 146.  Ativan is frequently considered contraindicated in hemodialysis patients but this was an emergent situation (seizure, for which benzodiazepines are first-line drugs, and nausea/vomiting in the setting of severe QT prolongation making other antiemetics risky).  4:40 AM Dr. Bridgett Larsson accepts for admission to the hospitalist service.  He requested a head CT given her suspected new onset seizure.  5:30 AM Head CT shows an age-indeterminate cerebellar infarct.  While seizures originating in the cerebellum are extremely rare this could be the focus of the  observed seizure-like activity.  It could also explain why the patient was still conscious during the event.  7:13 AM Dr. Lorin Mercy, hospitalist, and ED to see patient.  PROCEDURES  Procedures CRITICAL CARE Performed by: Karen Chafe Jamesa Tedrick Total critical care time: 30 minutes Critical care time was exclusive of separately billable procedures and treating other patients. Critical care was necessary to treat or prevent imminent or life-threatening deterioration. Critical care was time spent personally by me on the following activities: development of treatment plan with patient and/or surrogate as well as nursing, discussions with consultants, evaluation of patient's response to treatment, examination of patient, obtaining history from patient or surrogate, ordering and performing  treatments and interventions, ordering and review of laboratory studies, ordering and review of radiographic studies, pulse oximetry and re-evaluation of patient's condition.   ED DIAGNOSES     ICD-10-CM   1. Nausea and vomiting in adult  R11.2     2. Generalized weakness  R53.1     3. Observed seizure-like activity (HCC)  R56.9     4. Cerebellar infarct ([place])  I63.9          Malakai Schoenherr, MD 10/04/22 [phone]

## 2022-10-04 NOTE — ED Notes (Signed)
ED TO INPATIENT HANDOFF REPORT  ED Nurse Name and Phone #:  Altha Harm [phone]  S Name/Age/Gender Kathy Lewis 27 y.o. female Room/Bed: MH05/MH05  Code Status   Code Status: Full Code  Home/SNF/Other Home Patient oriented to: self, place, time, and situation Is this baseline? Yes   Triage Complete: Triage complete  Chief Complaint Seizure-like activity [facility]) [R56.9]  Triage Note N/V with lightheadedness. Since yesterday, worse tonight.    Allergies Allergies  Allergen Reactions   Cantaloupe Extract Allergy Skin Test Itching    Mouth itching     Strawberry Extract Itching    Mouth itching   Citrullus Vulgaris Itching    Mouth itching    Food Itching    All melon - mouth itching   Nsaids Other (See Comments)    Avoid per nephrology     Level of Care/Admitting Diagnosis ED Disposition     ED Disposition  Admit   Condition  --   [place]: [place] [[id number]]  Level of Care: Progressive [102]  Admit to Progressive based on following criteria: NEPHROLOGY stable condition requiring close monitoring for AKI, requiring Hemodialysis or Peritoneal Dialysis either from expected electrolyte imbalance, acidosis, or fluid overload that can be managed by NIPPV or high flow oxygen.  Admit to Progressive based on following criteria: NEUROLOGICAL AND NEUROSURGICAL complex patients with significant risk of instability, who do not meet ICU criteria, yet require close observation or frequent assessment (< / = every 2 - 4 hours) with medical / nursing intervention.  Interfacility transfer: Yes  May place patient in observation at [facility] or [place] if equivalent level of care is available:: No  Covid Evaluation: Asymptomatic - no recent exposure (last 10 days) testing not required  Diagnosis: Seizure-like activity [facility]) [[id number]]  Admitting Physician: Dory Horn [[id number]]  Attending Physician: Dory Horn [[id number]]           B Medical/Surgery History Past Medical History:  Diagnosis Date   Anemia    Anxiety    Bipolar 2 disorder ([place])    Chronic kidney disease    Chronic systolic (congestive) heart failure (HCC)    Depression    DKA (diabetic ketoacidoses)    ESRD on peritoneal dialysis ([place])    HSV infection    on valtrex   Hypokalemia    Leukocytosis    Migraine    Noncompliance with medication regimen    Preeclampsia    Prolonged QT syndrome    Severe anemia    Type 1 diabetes mellitus ([place])    Past Surgical History:  Procedure Laterality Date   BIOPSY  04/24/2022   Procedure: BIOPSY;  Surgeon: [name], MD;  Location: [place];  Service: Gastroenterology;;   CARDIAC CATHETERIZATION     COLONOSCOPY WITH PROPOFOL N/A 04/24/2022   Procedure: COLONOSCOPY WITH PROPOFOL;  Surgeon: [name], MD;  Location: [facility] ENDOSCOPY;  Service: Gastroenterology;  Laterality: N/A;   DILATION AND EVACUATION N/A 10/22/2019   Procedure: ULTRASOUND GUIDED DILATATION AND EVACUATION;  Surgeon: [name], MD;  Location: [facility] LD ORS;  Service: Gynecology;  Laterality: N/A;   ESOPHAGOGASTRODUODENOSCOPY (EGD) WITH PROPOFOL N/A 04/24/2022   Procedure: ESOPHAGOGASTRODUODENOSCOPY (EGD) WITH PROPOFOL;  Surgeon: [name], MD;  Location: [place];  Service: Gastroenterology;  Laterality: N/A;   IR FLUORO GUIDE CV LINE RIGHT  09/14/2022   IR US GUIDE VASC ACCESS RIGHT  09/14/2022   NO PAST SURGERIES     peritoneal dialysis catheter insertion  RENAL BIOPSY       A IV Location/Drains/Wounds Patient Lines/Drains/Airways Status     Active Line/Drains/Airways     Name Placement date Placement time Site Days   Peripheral IV 10/04/22 20 G Left Antecubital 10/04/22  0257  Antecubital  less than 1   Peripheral IV 10/04/22 20 G Right Antecubital 10/04/22  0332  Antecubital  less than 1   Hemodialysis Catheter Right Internal jugular Double lumen Permanent (Tunneled) 09/14/22  1520   Internal jugular  20            Intake/Output Last 24 hours No intake or output data in the 24 hours ending 10/04/22 2440  Labs/Imaging Results for orders placed or performed during the hospital encounter of 10/04/22 (from the past 48 hour(s))  CBG monitoring, ED     Status: Abnormal   Collection Time: 10/04/22  3:01 AM  Result Value Ref Range   Glucose-Capillary 146 (H) 70 - 99 mg/dL    Comment: Glucose reference range applies only to samples taken after fasting for at least 8 hours.  CBC with Differential     Status: Abnormal   Collection Time: 10/04/22  3:03 AM  Result Value Ref Range   WBC 4.2 4.0 - 10.5 K/uL   RBC 3.99 3.87 - 5.11 MIL/uL   Hemoglobin 12.5 12.0 - 15.0 g/dL   HCT 40.7 36.0 - 46.0 %   MCV 102.0 (H) 80.0 - 100.0 fL   MCH 31.3 26.0 - 34.0 pg   MCHC 30.7 30.0 - 36.0 g/dL   RDW 17.0 (H) 11.5 - 15.5 %   Platelets 172 150 - 400 K/uL   nRBC 0.0 0.0 - 0.2 %   Neutrophils Relative % 71 %   Neutro Abs 3.0 1.7 - 7.7 K/uL   Lymphocytes Relative 18 %   Lymphs Abs 0.8 0.7 - 4.0 K/uL   Monocytes Relative 6 %   Monocytes Absolute 0.2 0.1 - 1.0 K/uL   Eosinophils Relative 3 %   Eosinophils Absolute 0.1 0.0 - 0.5 K/uL   Basophils Relative 2 %   Basophils Absolute 0.1 0.0 - 0.1 K/uL   Immature Granulocytes 0 %   Abs Immature Granulocytes 0.01 0.00 - 0.07 K/uL    Comment: Performed at [facility], [address] [place], [place] [address]  Magnesium     Status: None   Collection Time: 10/04/22  3:15 AM  Result Value Ref Range   Magnesium 2.0 1.7 - 2.4 mg/dL    Comment: Performed at [facility], [address] [place], [place] [address]  Basic metabolic panel     Status: Abnormal   Collection Time: 10/04/22  3:22 AM  Result Value Ref Range   Sodium 136 135 - 145 mmol/L   Potassium 4.4 3.5 - 5.1 mmol/L    Comment: DELTA CHECK NOTED   Chloride 101 98 - 111 mmol/L   CO2 21 (L) 22 - 32 mmol/L   Glucose, Bld 169 (H) 70 - 99 mg/dL     Comment: Glucose reference range applies only to samples taken after fasting for at least 8 hours.   BUN 39 (H) 6 - 20 mg/dL   Creatinine, Ser 7.04 (H) 0.44 - 1.00 mg/dL   Calcium 8.5 (L) 8.9 - 10.3 mg/dL   GFR, Estimated 8 (L) >60 mL/min    Comment: (NOTE) Calculated using the CKD-EPI Creatinine Equation (2021)    Anion gap 14 5 - 15    Comment:  Performed at [facility], [address] [place], [place] [address]  hCG, quantitative, pregnancy     Status: None   Collection Time: 10/04/22  3:22 AM  Result Value Ref Range   hCG, Beta Chain, Quant, S 3 <5 mIU/mL    Comment:          GEST. AGE      CONC.  (mIU/mL)   <=1 WEEK        5 - 50     2 WEEKS       50 - 500     3 WEEKS       100 - 10,000     4 WEEKS     1,000 - 30,000     5 WEEKS     3,500 - 115,000   6-8 WEEKS     12,000 - 270,000    12 WEEKS     15,000 - 220,000        FEMALE AND NON-PREGNANT FEMALE:     LESS THAN 5 mIU/mL Performed at [facility], [address] [place], [place] [address]   Resp panel by RT-PCR (RSV, Flu A&B, Covid) Anterior Nasal Swab     Status: None   Collection Time: 10/04/22  3:25 AM   Specimen: Anterior Nasal Swab  Result Value Ref Range   SARS Coronavirus 2 by RT PCR NEGATIVE NEGATIVE    Comment: (NOTE) SARS-CoV-2 target nucleic acids are NOT DETECTED.  The SARS-CoV-2 RNA is generally detectable in upper respiratory specimens during the acute phase of infection. The lowest concentration of SARS-CoV-2 viral copies this assay can detect is 138 copies/mL. A negative result does not preclude SARS-Cov-2 infection and should not be used as the sole basis for treatment or other patient management decisions. A negative result may occur with  improper specimen collection/handling, submission of specimen other than nasopharyngeal swab, presence of viral mutation(s) within the areas targeted by this assay, and inadequate number of viral copies(<138 copies/mL). A negative result  must be combined with clinical observations, patient history, and epidemiological information. The expected result is Negative.  Fact Sheet for Patients:  EntrepreneurPulse.com.au  Fact Sheet for Healthcare Providers:  IncredibleEmployment.be  This test is no t yet approved or cleared by the [place] FDA and  has been authorized for detection and/or diagnosis of SARS-CoV-2 by FDA under an Emergency Use Authorization (EUA). This EUA will remain  in effect (meaning this test can be used) for the duration of the COVID-19 declaration under Section 564(b)(1) of the Act, 21 U.S.C.section 360bbb-3(b)(1), unless the authorization is terminated  or revoked sooner.       Influenza A by PCR NEGATIVE NEGATIVE   Influenza B by PCR NEGATIVE NEGATIVE    Comment: (NOTE) The Xpert Xpress SARS-CoV-2/FLU/RSV plus assay is intended as an aid in the diagnosis of influenza from Nasopharyngeal swab specimens and should not be used as a sole basis for treatment. Nasal washings and aspirates are unacceptable for Xpert Xpress SARS-CoV-2/FLU/RSV testing.  Fact Sheet for Patients: EntrepreneurPulse.com.au  Fact Sheet for Healthcare Providers: IncredibleEmployment.be  This test is not yet approved or cleared by the [place] FDA and has been authorized for detection and/or diagnosis of SARS-CoV-2 by FDA under an Emergency Use Authorization (EUA). This EUA will remain in effect (meaning this test can be used) for the duration of the COVID-19 declaration under Section 564(b)(1) of the Act, 21 U.S.C. section 360bbb-3(b)(1), unless the authorization is terminated or  revoked.     Resp Syncytial Virus by PCR NEGATIVE NEGATIVE    Comment: (NOTE) Fact Sheet for Patients: EntrepreneurPulse.com.au  Fact Sheet for Healthcare Providers: IncredibleEmployment.be  This test is not yet approved or  cleared by the [place] FDA and has been authorized for detection and/or diagnosis of SARS-CoV-2 by FDA under an Emergency Use Authorization (EUA). This EUA will remain in effect (meaning this test can be used) for the duration of the COVID-19 declaration under Section 564(b)(1) of the Act, 21 U.S.C. section 360bbb-3(b)(1), unless the authorization is terminated or revoked.  Performed at [facility], [address] [place], [place] [address]   I-Stat venous blood gas, [facility] ED, [facility], [facility])     Status: Abnormal   Collection Time: 10/04/22  3:26 AM  Result Value Ref Range   pH, Ven 7.407 7.25 - 7.43   pCO2, Ven 36.0 (L) 44 - 60 mmHg   pO2, Ven 27 (LL) 32 - 45 mmHg   Bicarbonate 22.8 20.0 - 28.0 mmol/L   TCO2 24 22 - 32 mmol/L   O2 Saturation 54 %   Acid-base deficit 2.0 0.0 - 2.0 mmol/L   Sodium 136 135 - 145 mmol/L   Potassium 4.4 3.5 - 5.1 mmol/L   Calcium, Ion 0.99 (L) 1.15 - 1.40 mmol/L   HCT 42.0 36.0 - 46.0 %   Hemoglobin 14.3 12.0 - 15.0 g/dL   Patient temperature 97.5 F    Sample type VENOUS    Comment NOTIFIED PHYSICIAN   CBG monitoring, ED     Status: Abnormal   Collection Time: 10/04/22  5:39 AM  Result Value Ref Range   Glucose-Capillary 182 (H) 70 - 99 mg/dL    Comment: Glucose reference range applies only to samples taken after fasting for at least 8 hours.  CBG monitoring, ED     Status: Abnormal   Collection Time: 10/04/22  8:28 AM  Result Value Ref Range   Glucose-Capillary 172 (H) 70 - 99 mg/dL    Comment: Glucose reference range applies only to samples taken after fasting for at least 8 hours.   CT Head Wo Contrast  Result Date: 10/04/2022 CLINICAL DATA:  27 year old female with seizure-like activity, nausea vomiting. Type 1 diabetes. EXAM: CT HEAD WITHOUT CONTRAST TECHNIQUE: Contiguous axial images were obtained from the base of the skull through the vertex without intravenous contrast. RADIATION DOSE REDUCTION: This exam was performed  according to the departmental dose-optimization program which includes automated exposure control, adjustment of the mA and/or kV according to patient size and/or use of iterative reconstruction technique. COMPARISON:  None Available. FINDINGS: Brain: No midline shift, ventriculomegaly, mass effect, evidence of mass lesion, intracranial hemorrhage or evidence of cortically based acute infarction. Small cavum septum pellucidum, normal variant. Small chronic appearing, circumscribed infarct of the right superior cerebellar artery territory (coronal image 23). No cerebral cortical encephalomalacia identified and elsewhere gray-white differentiation is within normal limits. Vascular: Very faint Calcified atherosclerosis at the skull base. Skull: Negative. Sinuses/Orbits: Visualized paranasal sinuses and mastoids are clear. Other: Visualized orbits and scalp soft tissues are within normal limits. IMPRESSION: 1. 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 2. No acute intracranial abnormality. And otherwise negative noncontrast CT appearance of the brain. Electronically Signed   By: Genevie Ann M.D.   On: 10/04/2022 05:08    Pending Labs Unresulted Labs (From admission, onward)     Start     Ordered   10/05/22 9357  Basic metabolic panel  Tomorrow morning,   R        10/04/22 0743   10/05/22 0500  CBC  Tomorrow morning,   R        10/04/22 0743   10/04/22 0742  HIV Antibody (routine testing w rflx)  (HIV Antibody (Routine testing w reflex) panel)  Once,   URGENT        10/04/22 0743            Vitals/Pain Today's Vitals   10/04/22 0700 10/04/22 0730 10/04/22 0830 10/04/22 0900  BP: (!) 149/130  (!) 156/133 (!) 157/128  Pulse:   (!) 123 (!) 122  Resp: (!) 26  11 18   Temp:  97.7 F (36.5 C)    TempSrc:  Oral    SpO2:   95% 94%  Weight:      Height:      PainSc:        Isolation Precautions No active isolations  Medications Medications  atorvastatin (LIPITOR) tablet 40 mg (has  no administration in time range)  amitriptyline (ELAVIL) tablet 50 mg (has no administration in time range)  hydrOXYzine (ATARAX) tablet 10 mg (has no administration in time range)  clonazePAM (KLONOPIN) tablet 0.5 mg (has no administration in time range)  acetaminophen (TYLENOL) tablet 650 mg (has no administration in time range)    Or  acetaminophen (TYLENOL) suppository 650 mg (has no administration in time range)  sorbitol 70 % solution 30 mL (has no administration in time range)  docusate sodium (ENEMEEZ) enema 283 mg (has no administration in time range)  calcium carbonate (dosed in mg elemental calcium) suspension 500 mg of elemental calcium (has no administration in time range)  feeding supplement (NEPRO CARB STEADY) liquid 237 mL (has no administration in time range)  heparin injection 5,000 Units (has no administration in time range)  sodium chloride flush (NS) 0.9 % injection 3 mL (has no administration in time range)  hydrALAZINE (APRESOLINE) injection 5 mg (has no administration in time range)  insulin aspart (novoLOG) injection 0-6 Units (has no administration in time range)  sodium chloride 0.9 % bolus 250 mL (0 mLs Intravenous Stopped 10/04/22 0411)  LORazepam (ATIVAN) 2 MG/ML injection (2 mg  Given 10/04/22 0331)  promethazine (PHENERGAN) 12.5 mg in sodium chloride 0.9 % 50 mL IVPB (0 mg Intravenous Stopped 10/04/22 0713)  promethazine (PHENERGAN) 25 MG/ML injection (  Given 10/04/22 0655)    Mobility walks     Focused Assessments Neuro Assessment Handoff:  Swallow screen pass?  N/a         Neuro Assessment:   Neuro Checks:      Has TPA been given? No If patient is a Neuro Trauma and patient is going to OR before floor call report to 4N Charge nurse: [phone] or [phone]  , Renal Assessment Handoff:  Hemodialysis Schedule: Hemodialysis Schedule: Tuesday/Thursday/Saturday Last Hemodialysis date and time:  10/03/22   Restricted appendage:  chest double lumen  and lower abdomen    Recommendations: See Admitting Provider Note  Report given to:   Additional Notes: Pt is c/o being hot but her extremities are cool and her temp is normal.  The Hospitalist is aware and stumped as well.

## 2022-10-04 NOTE — H&P (Addendum)
History and Physical - seen in person at [facility] this AM and then again on arrival at [facility]   Patient: Kathy Lewis HAL:[id number] DOB: [birth year] DOA: 10/04/2022 DOS: the patient was seen and examined on 10/04/2022 PCP: Jensen.  Patient coming from: Home - lives with sister; NOK: Mother, Kathy Lewis, [phone]   Chief Complaint: n/v  HPI: Kathy Lewis is a 27 y.o. female with medical history significant of ESRD recently started on HD, chronic systolic CHF (EF 68%), DM presenting with n/v.  She reports that she was on PD from 04/2022 until 3 weeks ago - she wasn't clearing enough and so was transitioned to HD.  She goes TTS but she did not complete her HD session yesterday because she developed n/v and light-headedness.  She came to the ER and her glucose was 44; she improved some and was discharged.  However, she felt the say way overnight and returned to the ER.  Upon arrival, she was hypothermic and placed on a Bair hugger but this was removed.  At [facility] she was currently normothermic (97.9) and yet diaphoretic and asking for a fan.  She also had GTC activity in the ER and was given Ativan.  She reports that she feels about the same at this time as this AM, still with nausea and last emesis was with EMS.  She was more alert at the time of my second exam.       ER Course:  ESRD on HD (recently started), T1DM, EF 20%.  Presented with n/v.  Given small bolus.  Had seizure-like episode in ER, glucose ok, given Ativan.  Seen yesterday for weakness and hypoglycemia, gluose 44, discharged from ER.  CT with ?cerebellar lesion.  Vomiting again now, given Phenergan.  QTc 515 - no Tigan at [facility].     Review of Systems: As mentioned in the history of present illness. All other systems reviewed and are negative. Past Medical History:  Diagnosis Date   Anemia    Anxiety    Bipolar 2 disorder (HCC)    Chronic kidney disease    Chronic systolic (congestive) heart failure (HCC)     Depression    DKA (diabetic ketoacidoses)    ESRD on peritoneal dialysis ([place])    HSV infection    on valtrex   Hypokalemia    Leukocytosis    Migraine    Noncompliance with medication regimen    Preeclampsia    Prolonged QT syndrome    Severe anemia    Type 1 diabetes mellitus ([place])    Past Surgical History:  Procedure Laterality Date   BIOPSY  04/24/2022   Procedure: BIOPSY;  Surgeon: [name], MD;  Location: [place];  Service: Gastroenterology;;   CARDIAC CATHETERIZATION     COLONOSCOPY WITH PROPOFOL N/A 04/24/2022   Procedure: COLONOSCOPY WITH PROPOFOL;  Surgeon: [name], MD;  Location: [place];  Service: Gastroenterology;  Laterality: N/A;   DILATION AND EVACUATION N/A 10/22/2019   Procedure: ULTRASOUND GUIDED DILATATION AND EVACUATION;  Surgeon: [name], MD;  Location: [facility];  Service: Gynecology;  Laterality: N/A;   ESOPHAGOGASTRODUODENOSCOPY (EGD) WITH PROPOFOL N/A 04/24/2022   Procedure: ESOPHAGOGASTRODUODENOSCOPY (EGD) WITH PROPOFOL;  Surgeon: [name], MD;  Location: [place];  Service: Gastroenterology;  Laterality: N/A;   IR FLUORO GUIDE CV LINE RIGHT  09/14/2022   IR US GUIDE VASC ACCESS RIGHT  09/14/2022   peritoneal dialysis catheter insertion     RENAL BIOPSY  Social History:  reports that she has quit smoking. Her smoking use included cigars and cigarettes. She has a 2.00 pack-year smoking history. She has never used smokeless tobacco. She reports that she does not drink alcohol and does not use drugs.  Allergies  Allergen Reactions   Cantaloupe Extract Allergy Skin Test Itching    Mouth itching     Strawberry Extract Itching    Mouth itching   Citrullus Vulgaris Itching    Mouth itching    Food Itching    All melon - mouth itching   Nsaids Other (See Comments)    Avoid per nephrology     Family History  Adopted: Yes  Problem Relation Age of Onset   Heart disease Neg Hx     Prior  to Admission medications   Medication Sig Start Date End Date Taking? Authorizing Provider  acetaminophen (TYLENOL) 500 MG tablet Take 1,000 mg by mouth every 6 (six) hours as needed for headache (pain).    [provider]  amitriptyline (ELAVIL) 50 MG tablet Take 1 tablet (50 mg total) by mouth at bedtime. 08/30/22   Salley Slaughter, NP  atorvastatin (LIPITOR) 40 MG tablet Take 40 mg by mouth every evening.    [provider]  calcium carbonate (TUMS - DOSED IN MG ELEMENTAL CALCIUM) 500 MG chewable tablet Take 2 tablets by mouth 3 (three) times daily with meals.    [provider]  clonazePAM (KLONOPIN) 0.5 MG tablet Take 1 tablet (0.5 mg total) by mouth 2 (two) times daily as needed for anxiety. 03/16/22   [name], MD  hydrOXYzine (ATARAX) 10 MG tablet Take 1 tablet (10 mg total) by mouth 3 (three) times daily as needed. 08/30/22   Eulis Canner E, NP  insulin lispro (HUMALOG) 100 UNIT/ML injection Inject 0.5 Units into the skin See admin instructions. 0.5 units per hour via insulin pump. 01/08/22   [provider]  LORazepam (ATIVAN) 1 MG tablet Take 0.5 tablets (0.5 mg total) by mouth 3 (three) times daily as needed. 08/11/22   Mesner, Corene Cornea, MD  magnesium oxide (MAG-OX) 400 MG tablet Take 800 mg by mouth daily.    [provider]  methocarbamol (ROBAXIN) 500 MG tablet Take 1 tablet (500 mg total) by mouth 2 (two) times daily as needed for up to 15 doses for muscle spasms. 09/06/22   Wyvonnia Dusky, MD  metoCLOPramide (REGLAN) 10 MG tablet Take 10 mg by mouth 4 (four) times daily as needed for nausea or vomiting. 08/15/22   [provider]  ondansetron (ZOFRAN) 4 MG tablet Take 4 mg by mouth as needed for nausea or vomiting.    [provider]  Vitamin D, Ergocalciferol, (DRISDOL) 1.25 MG (50000 UNIT) CAPS capsule Take 50,000 Units by mouth every Sunday.    [provider]  escitalopram (LEXAPRO) 10 MG tablet  Take 20 mg by mouth daily.  05/10/20 10/06/20  [provider]  furosemide (LASIX) 40 MG tablet Take 1 tablet (40 mg total) by mouth daily. Patient not taking: Reported on 02/16/2021 10/28/19 02/17/21  [name], MD  lisinopril (ZESTRIL) 10 MG tablet Take 10 mg by mouth daily.  02/23/20 10/06/20  [provider]  spironolactone (ALDACTONE) 25 MG tablet Take 25 mg by mouth daily.  04/20/20 10/06/20  [provider]    Physical Exam: Vitals:   10/04/22 0730 10/04/22 0830 10/04/22 0900 10/04/22 0930  BP:  (!) 156/133 (!) 157/128 (!) 164/133  Pulse:  Marland Kitchen)  123 (!) 122   Resp:  11 18 19   Temp: 97.7 F (36.5 C)   (!) 97.1 F (36.2 C)  TempSrc: Oral     SpO2:  95% 94%   Weight:      Height:       General:  Appears calm and comfortable, chronically ill, diaphoretic, somewhat somnolent Eyes:  EOMI, normal lids, iris ENT:  grossly normal hearing, lips & tongue, mmm; appropriate dentition Neck:  no LAD, masses or thyromegaly Cardiovascular:  RR with tachycardia, ?gallop, no m/r/g. No LE edema.  Respiratory:   CTA bilaterally with no wheezes/rales/rhonchi.  Mildly increased respiratory effort at time of initial exam, normalized by second exam. Abdomen:  soft, NT, ND Skin:  no rash or induration seen on limited exam; her extremities are very cool to touch despite normothermia, and she is somewhat diaphoretic Musculoskeletal:  grossly normal tone BUE/BLE, good ROM, no bony abnormality Lower extremity:  No LE edema.  Limited foot exam with no ulcerations.  2+ distal pulses. Psychiatric:  blunted/somnolent mood and affect, speech fluent and appropriate, AOx3 Neurologic:  CN 2-12 grossly intact, moves all extremities in coordinated fashion   Radiological Exams on Admission: Independently reviewed - see discussion in A/P where applicable  CT Head Wo Contrast  Result Date: 10/04/2022 CLINICAL DATA:  27 year old female with seizure-like activity, nausea vomiting. Type 1 diabetes.  EXAM: CT HEAD WITHOUT CONTRAST TECHNIQUE: Contiguous axial images were obtained from the base of the skull through the vertex without intravenous contrast. RADIATION DOSE REDUCTION: This exam was performed according to the departmental dose-optimization program which includes automated exposure control, adjustment of the mA and/or kV according to patient size and/or use of iterative reconstruction technique. COMPARISON:  None Available. FINDINGS: Brain: No midline shift, ventriculomegaly, mass effect, evidence of mass lesion, intracranial hemorrhage or evidence of cortically based acute infarction. Small cavum septum pellucidum, normal variant. Small chronic appearing, circumscribed infarct of the right superior cerebellar artery territory (coronal image 23). No cerebral cortical encephalomalacia identified and elsewhere gray-white differentiation is within normal limits. Vascular: Very faint Calcified atherosclerosis at the skull base. Skull: Negative. Sinuses/Orbits: Visualized paranasal sinuses and mastoids are clear. Other: Visualized orbits and scalp soft tissues are within normal limits. IMPRESSION: 1. 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 2. No acute intracranial abnormality. And otherwise negative noncontrast CT appearance of the brain. Electronically Signed   By: Genevie Ann M.D.   On: 10/04/2022 05:08    EKG: Independently reviewed.  Sinus tachycardia with rate 115; prolonged QTc 515; no evidence of acute ischemia   Labs on Admission: I have personally reviewed the available labs and imaging studies at the time of the admission.  Pertinent labs:    VBG: 7.407/36/22.8 Glucose 169 BUN 39/Creatinine 7.03/GFR 8 Unremarkable CBC COVID/flu/RSV negative   Assessment and Plan: Principal Problem:   Intractable nausea and vomiting Active Problems:   Seizure-like activity (HCC)   ESRD on hemodialysis ([place])   Uncontrolled type 1 diabetes mellitus with hyperglycemia, with  long-term current use of insulin (HCC)   Prolonged QT interval   Chronic systolic CHF (congestive heart failure) (HCC)   Bipolar 2 disorder (HCC)   Dyslipidemia    N/V -Patient with shortened HD session due to n/v - found to have hypoglycemia in the ER yesterday and so discharged -Recurrent n/v and so presented back to the ER overnight -Prolonged QT so limited anti-emetic options; was given Phenergan, Ativan, and a small bolus -Will admit to progressive care at [facility] -May  be related to insufficient HD, will consult nephrology  Seizure-like activity -Head CT with chronic-appearing cerebellar infarct - this would be an unusual cause of seizures -No known h/o seizures -Will plan for EEG and MRI  -Consider neurology consultation  Prolonged QTc -Will attempt to avoid QT-prolonging medications such as PPI, nausea meds, SSRIs; however, this is problematic since she is having n/v and there was no Tigan at [facility] -Repeat EKG now - if normalized can have antiemetics and otherwise will rx Tigan   ?Temperature instability -Her extremities are frigid and she was initially placed on a Bair hugger upon arrival at [facility] -However, she has been normothermic throughout -She is also diaphoretic -Also with persistent tachycardia -Will order echo and continue to closely monitor -PCCM asked to consult  ESRD on HD -Recently started on HD from PD -She was unable to complete her session yesterday but reports that she has done fine with this transition otherwise -Patient on chronic TTS HD -Nephrology prn order set utilized -Nephrology will be notified and asked to consult  Chronic systolic CHF -4/92/01 echo with EF 20%, severely decreased function -She is volume controlled with HD -Has persistent tachycardia with murmur, ?gallop -Will repeat echo now  DM -Recent A1c was 8.6 -On insulin pump, will hold given ?seizure-like activity -Improved glucose since starting HD -Cover with very sensitive-scale  SSI  -Consult DM coordinator  Mood d/o -Continue amitriptyline, Klonopin -She reports also taking Ativan - will hold at this time  HLD -Continue atorvastatin   Advance Care Planning:   Code Status: Full Code - Code status was discussed with the patient and/or family at the time of admission.  The patient would want to receive full resuscitative measures at this time.   Consults: Nephrology; may need neurology; DM coordinator; [facility] team  DVT Prophylaxis: Heparin  Family Communication: None present; I spoke with her mother this AM while she was at [facility]  Severity of Illness: Admit - It is my clinical opinion that admission to INPATIENT is reasonable and necessary because of the expectation that this patient will require hospital care that crosses at least 2 midnights to treat this condition based on the medical complexity of the problems presented.  Given the aforementioned information, the predictability of an adverse outcome is felt to be significant.   Author: Karmen Bongo, MD 10/04/2022 10:01 AM  For on call review [email or web address].

## 2022-10-04 NOTE — Consult Note (Signed)
NAME:  Kathy Lewis, MRN:  [id number], DOB:  1995/10/23, LOS: 0 ADMISSION DATE:  10/04/2022, CONSULTATION DATE:  10/04/22 REFERRING MD:  Dr. Lorin Mercy, CHIEF COMPLAINT:  Hypertension    History of Present Illness:  Kathy Lewis is a 27 year old female with a past medical history significant for type 1 diabetes, end-stage renal disease, bipolar type II, chronic systolic congestive heart failure, anxiety, and depression presented to the emergency department 2/6 with reports of nausea, vomiting, and hyperglycemia.  Of note patient recently switched from PD to intermittent hemodialysis 3 weeks ago and was unable to complete her HD session day prior to admission to nausea, vomiting, and lightheadedness.  On ED arrival patient was seen hypoglycemic with blood sugar 44 and mildly hypothermic.  There is also some concern for seizure-like activity in the ED for which she received Ativan.  CT head on admission with questionable cerebellar lesion.   Mid afternoon 2/7 patient was seen with persistent tachycardia and severe hypertension resulting in critical care consultation.  Decision made to move patient to ICU for close monitoring and Cleviprex drip  Pertinent  Medical History  Type 1 diabetes, end-stage renal disease, bipolar type II, chronic systolic congestive heart failure, anxiety, and depression   Significant Hospital Events: Including procedures, antibiotic start and stop dates in addition to other pertinent events   2/7 PCCM consulted for transfer ICU and Cleviprex drip  Interim History / Subjective:  As above  Objective   Blood pressure (!) 145/118, pulse (!) 123, temperature 97.7 F (36.5 C), temperature source Oral, resp. rate 13, height 5' (1.524 m), weight 60.7 kg, SpO2 98 %.       No intake or output data in the 24 hours ending 10/04/22 1639 Filed Weights   10/04/22 0252  Weight: 60.7 kg    Examination: General: Acute on chronic ill-appearing adult female lying in bed in no acute  distress HEENT: Walls/AT, MM pink/moist, PERRL,  Neuro: Alert and oriented x 3, nonfocal CV: s1s2 regular rate and rhythm, no murmur, rubs, or gallops,  PULM: Rotation bilaterally, no increased work of breathing, no added breath sounds GI: soft, bowel sounds active in all 4 quadrants, non-tender, non-distended, tolerating oral diet Extremities: warm/dry, no edema  Skin: no rashes or lesions  Resolved Hospital Problem list     Assessment & Plan:  Gastroparesis in the setting of type 1 diabetes Poorly controlled type 1 diabetes -Most recent A1c 06/27/2022 8.6 P: Continue sliding scale insulin Continue meal coverage Continue long-acting insulin Oral diet Antiemetics as needed  Right superior cerebral infarct -TEE revealed small chronic appearing right superior cerebellar infarct Seizure-like activities -Patient seen with tonic-clonic activity in ED of note patient was hypoglycemic around this time P: Maintain neuro protective measures; goal for eurothermia, euglycemia, eunatermia, normoxia, and PCO2 goal of 35-40 Nutrition and bowel regiment  Seizure precautions  Aspirations precautions  Follow EEG results MRI pending  History of end-stage renal disease on HD Tuesday Thursday Saturday P: Follow renal function  Monitor urine output Trend Bmet Avoid nephrotoxins Ensure adequate renal perfusion   History of chronic systolic congestive heart failure -Echocardiogram March 2023 EF 20% with global hypokinesis History of hyperlipidemia Uncontrolled hypertension -Per patient recent taken off p.o. antihypertensives P: Continuous telemetry  Statin Heart health diet with sodium restriction when able  Strict intake and output  Daily weight to assess volume status Daily assessment for need to diurese  Closely monitor renal function and electrolytes  Transferred to ICU for Cleviprex infusion Repeat ECHO pending  Mood disorder including type II bipolar disease P: Continue  amitriptyline   Best Practice (right click and "Reselect all SmartList Selections" daily)   Diet/type: Regular consistency (see orders) DVT prophylaxis: systemic heparin GI prophylaxis: PPI Lines: N/A Foley:  N/A Code Status:  full code Last date of multidisciplinary goals of care discussion: Continue to update patient daily   Labs   CBC: Recent Labs  Lab 10/03/22 1142 10/04/22 0303 10/04/22 0326  WBC 5.0 4.2  --   NEUTROABS  --  3.0  --   HGB 13.8 12.5 14.3  HCT 43.9 40.7 42.0  MCV 100.0 102.0*  --   PLT 155 172  --     Basic Metabolic Panel: Recent Labs  Lab 10/03/22 1142 10/04/22 0315 10/04/22 0322 10/04/22 0326  NA 134*  --  136 136  K 3.6  --  4.4 4.4  CL 101  --  101  --   CO2 24  --  21*  --   GLUCOSE 155*  --  169*  --   BUN 34*  --  39*  --   CREATININE 5.83*  --  7.04*  --   CALCIUM 7.7*  --  8.5*  --   MG  --  2.0  --   --    GFR: Estimated Creatinine Clearance: 9.9 mL/min (A) (by C-G formula based on SCr of 7.04 mg/dL (H)). Recent Labs  Lab 10/03/22 1142 10/04/22 0303  WBC 5.0 4.2    Liver Function Tests: No results for input(s): "AST", "ALT", "ALKPHOS", "BILITOT", "PROT", "ALBUMIN" in the last 168 hours. No results for input(s): "LIPASE", "AMYLASE" in the last 168 hours. No results for input(s): "AMMONIA" in the last 168 hours.  ABG    Component Value Date/Time   PHART 7.410 06/17/2019 1040   PCO2ART 38.3 06/17/2019 1040   PO2ART 96.5 06/17/2019 1040   HCO3 22.8 10/04/2022 0326   TCO2 24 10/04/2022 0326   ACIDBASEDEF 2.0 10/04/2022 0326   O2SAT 54 10/04/2022 0326     Coagulation Profile: No results for input(s): "INR", "PROTIME" in the last 168 hours.  Cardiac Enzymes: No results for input(s): "CKTOTAL", "CKMB", "CKMBINDEX", "TROPONINI" in the last 168 hours.  HbA1C: Hgb A1c MFr Bld  Date/Time Value Ref Range Status  06/27/2022 11:37 PM 8.6 (H) 4.8 - 5.6 % Corrected    Comment:    (NOTE)         Prediabetes: 5.7 - 6.4          Diabetes: >6.4         Glycemic control for adults with diabetes: <7.0 CORRECTED ON 11/02 AT 0035: PREVIOUSLY REPORTED AS 8.1   11/23/2021 06:28 PM 9.2 (H) 4.8 - 5.6 % Final    Comment:    (NOTE) Pre diabetes:          5.7%-6.4%  Diabetes:              >6.4%  Glycemic control for   <7.0% adults with diabetes     CBG: Recent Labs  Lab 10/03/22 1248 10/04/22 0301 10/04/22 0539 10/04/22 0828 10/04/22 1311  GLUCAP 172* 146* 182* 172* 173*    Review of Systems:   Please see the history of present illness. All other systems reviewed and are negative    Past Medical History:  She,  has a past medical history of Anemia, Anxiety, Bipolar 2 disorder ([place]), Chronic kidney disease, Chronic systolic (congestive) heart failure ([place]), Depression, DKA (diabetic ketoacidoses), ESRD on peritoneal dialysis (  [place]), HSV infection, Hypokalemia, Leukocytosis, Migraine, Noncompliance with medication regimen, Preeclampsia, Prolonged QT syndrome, Severe anemia, and Type 1 diabetes mellitus ([place]).   Surgical History:   Past Surgical History:  Procedure Laterality Date   BIOPSY  04/24/2022   Procedure: BIOPSY;  Surgeon: [name], MD;  Location: [facility];  Service: Gastroenterology;;   CARDIAC CATHETERIZATION     COLONOSCOPY WITH PROPOFOL N/A 04/24/2022   Procedure: COLONOSCOPY WITH PROPOFOL;  Surgeon: [name], MD;  Location: [facility];  Service: Gastroenterology;  Laterality: N/A;   DILATION AND EVACUATION N/A 10/22/2019   Procedure: ULTRASOUND GUIDED DILATATION AND EVACUATION;  Surgeon: [name], MD;  Location: [facility];  Service: Gynecology;  Laterality: N/A;   ESOPHAGOGASTRODUODENOSCOPY (EGD) WITH PROPOFOL N/A 04/24/2022   Procedure: ESOPHAGOGASTRODUODENOSCOPY (EGD) WITH PROPOFOL;  Surgeon: [name], MD;  Location: [place];  Service: Gastroenterology;  Laterality: N/A;   IR FLUORO GUIDE CV LINE RIGHT  09/14/2022   IR US GUIDE VASC  ACCESS RIGHT  09/14/2022   peritoneal dialysis catheter insertion     RENAL BIOPSY       Social History:   reports that she has quit smoking. Her smoking use included cigars and cigarettes. She has a 2.00 pack-year smoking history. She has never used smokeless tobacco. She reports that she does not drink alcohol and does not use drugs.   Family History:  Her family history is negative for Heart disease. She was adopted.   Allergies Allergies  Allergen Reactions   Cantaloupe Extract Allergy Skin Test Itching    Mouth itching     Strawberry Extract Itching    Mouth itching   Citrullus Vulgaris Itching    Mouth itching    Food Itching    All melon - mouth itching   Nsaids Other (See Comments)    Avoid per nephrology      Home Medications  Prior to Admission medications   Medication Sig Start Date End Date Taking? Authorizing Provider  calcium acetate (PHOSLO) 667 MG capsule Take by mouth. 09/28/22  Yes [provider]  acetaminophen (TYLENOL) 500 MG tablet Take 1,000 mg by mouth every 6 (six) hours as needed for headache (pain).    [provider]  amitriptyline (ELAVIL) 50 MG tablet Take 1 tablet (50 mg total) by mouth at bedtime. 08/30/22   Salley Slaughter, NP  atorvastatin (LIPITOR) 40 MG tablet Take 40 mg by mouth every evening.    [provider]  calcium carbonate (TUMS - DOSED IN MG ELEMENTAL CALCIUM) 500 MG chewable tablet Take 2 tablets by mouth 3 (three) times daily with meals.    [provider]  clonazePAM (KLONOPIN) 0.5 MG tablet Take 1 tablet (0.5 mg total) by mouth 2 (two) times daily as needed for anxiety. 03/16/22   [name], MD  hydrOXYzine (ATARAX) 10 MG tablet Take 1 tablet (10 mg total) by mouth 3 (three) times daily as needed. 08/30/22   Eulis Canner E, NP  insulin lispro (HUMALOG) 100 UNIT/ML injection Inject 0.5 Units into the skin See admin instructions. 0.5 units per hour via insulin pump. 01/08/22    [provider]  LORazepam (ATIVAN) 1 MG tablet Take 0.5 tablets (0.5 mg total) by mouth 3 (three) times daily as needed. 08/11/22   Mesner, Corene Cornea, MD  magnesium oxide (MAG-OX) 400 MG tablet Take 800 mg by mouth daily.    [provider]  methocarbamol (ROBAXIN) 500 MG tablet Take 1 tablet (500 mg total)  by mouth 2 (two) times daily as needed for up to 15 doses for muscle spasms. 09/06/22   Wyvonnia Dusky, MD  metoCLOPramide (REGLAN) 10 MG tablet Take 10 mg by mouth 4 (four) times daily as needed for nausea or vomiting. 08/15/22   [provider]  ondansetron (ZOFRAN) 4 MG tablet Take 4 mg by mouth as needed for nausea or vomiting.    [provider]  Vitamin D, Ergocalciferol, (DRISDOL) 1.25 MG (50000 UNIT) CAPS capsule Take 50,000 Units by mouth every Sunday.    [provider]  escitalopram (LEXAPRO) 10 MG tablet Take 20 mg by mouth daily.  05/10/20 10/06/20  [provider]  furosemide (LASIX) 40 MG tablet Take 1 tablet (40 mg total) by mouth daily. Patient not taking: Reported on 02/16/2021 10/28/19 02/17/21  [name], MD  lisinopril (ZESTRIL) 10 MG tablet Take 10 mg by mouth daily.  02/23/20 10/06/20  [provider]  spironolactone (ALDACTONE) 25 MG tablet Take 25 mg by mouth daily.  04/20/20 10/06/20  [provider]     Critical care time:   CRITICAL CARE Performed by: Jaxson Anglin D. Harris   Total critical care time: 42 minutes  Critical care time was exclusive of separately billable procedures and treating other patients.  Critical care was necessary to treat or prevent imminent or life-threatening deterioration.  Critical care was time spent personally by me on the following activities: development of treatment plan with patient and/or surrogate as well as nursing, discussions with consultants, evaluation of patient's response to treatment, examination of patient, obtaining history from patient or surrogate, ordering  and performing treatments and interventions, ordering and review of laboratory studies, ordering and review of radiographic studies, pulse oximetry and re-evaluation of patient's condition.  Rimsha Trembley D. Harris, NP-C [facility] Personal contact information can be found on Amion  If no contact or response made please call [phone] 10/04/2022, 5:53 PM

## 2022-10-04 NOTE — Procedures (Signed)
Patient Name: Kathy Lewis  MRN: [id number]  Epilepsy Attending: Lora Havens  Referring Physician/Provider: Karmen Bongo, MD  Date: 10/04/2022 Duration: 23.26 mins  Patient history: 27yo F with ams. EEG to evaluate for seizure  Level of alertness: Awake, asleep  AEDs during EEG study: Clonazepam  Technical aspects: This EEG study was done with scalp electrodes positioned according to the 10-20 International system of electrode placement. Electrical activity was reviewed with band pass filter of 1-70Hz , sensitivity of 7 uV/mm, display speed of 38mm/sec with a 60Hz  notched filter applied as appropriate. EEG data were recorded continuously and digitally stored.  Video monitoring was available and reviewed as appropriate.  Description: The posterior dominant rhythm consists of 9Hz  activity of moderate voltage (25-35 uV) seen predominantly in posterior head regions, symmetric and reactive to eye opening and eye closing. Sleep was characterized by vertex waves, sleep spindles (12 to 14 Hz), maximal frontocentral region.  Hyperventilation and photic stimulation were not performed.     IMPRESSION: This study is within normal limits. No seizures or epileptiform discharges were seen throughout the recording.  Celestine Prim Barbra Sarks

## 2022-10-04 NOTE — Plan of Care (Signed)
   Patient Name: Kathy Lewis, Kathy Lewis DOB: 1996-03-18 MRN: [id number] Transferring facility: [facility] Requesting provider: molpus, md Reason for transfer:  27 yo AAF with hx of ESRD on HD, chronic systolic CHF EF 88%, DM, with 24 hours of Nausea. incomplete HD treatment yesterday. hypoglycemic at home. CBG in ER was 44. discharged from ER. went home and had more nausea. came back to ER where she had tonic/clonic activity per EDP but CBG was 146 at time of seizure-like episode. no prior hx of seizure. QTc on EKG was 515. requested EDP to obtain CT head Going to: [facility] Admission Status: observation Bed Type: progressive To Do: will need nephrology consult for HD. will need neurology consult for workup of seizure-activity.  TRH will assume care on arrival to accepting facility. Until arrival, medical decision making responsibilities remain with the EDP.  However, TRH available 24/7 for questions and assistance.   Nursing staff please page [place] and Consults [phone]) as soon as the patient arrives to the hospital.  Kristopher Oppenheim, DO [facility]

## 2022-10-04 NOTE — ED Notes (Signed)
All belongings and paperwork sent w/ transport service.

## 2022-10-04 NOTE — Progress Notes (Addendum)
[place] Progress Note Patient Name: Kathy Lewis DOB: April 10, 1996 MRN: [id number]   Date of Service  10/04/2022  HPI/Events of Note  Multiple issues: 1. Hypoglycemia - Blood glucose = 53. Patient on PO diet, but not eating much. 2. Nausea - QTc interval = 0.48 seconds. Patient is already on Hydroxyzine PRN for N/V.  eICU Interventions  Plan: Decrease Levemir from 8 to 5 units [place] Q 12 hours. D/C Novolog insulin 2 units [place] with meals.  Give already ordered Hydroxyzine for Nausea.     Intervention Category Major Interventions: Other:  Lysle Dingwall 10/04/2022, 10:23 PM

## 2022-10-04 NOTE — Inpatient Diabetes Management (Addendum)
Inpatient Diabetes Program Recommendations  AACE/ADA: New Consensus Statement on Inpatient Glycemic Control  Target Ranges:  Prepandial:   less than 140 mg/dL      Peak postprandial:   less than 180 mg/dL (1-2 hours)      Critically ill patients:  140 - 180 mg/dL    Latest Reference Range & Units 10/04/22 03:01 10/04/22 05:39 10/04/22 08:28  Glucose-Capillary 70 - 99 mg/dL 146 (H) 182 (H) 172 (H)    Latest Reference Range & Units 06/27/22 23:37  Hemoglobin A1C 4.8 - 5.6 % 8.6 (H)   Review of Glycemic Control  Diabetes history: DM1 Outpatient Diabetes medications: OmniPod insulin pump with Dexcom CGM; insulin pump settings (0.5 units/hour (total basal 12 units per day), 1 unit per 8 grams carbs, 1 unit drops glucose 50 mg/dl) Current orders for Inpatient glycemic control: Novolog 0-6 units TID with meals  Inpatient Diabetes Program Recommendations:    Insulin: Please consider ordering Levemir 8 units Q24H to start now (remove insulin pump when Levemir given), adding Novolog 0-5 units QHS, and Novolog 3 units TID with meals for meal coverage if patient eats at least 50% of meals.  NOTE: Noted consult for diabetes coordinator. Patient is known to inpatient diabetes team due multiple ED visits and hospital admissions (last inpatient hospital admission 09/12/22-09/16/22). Patient is currently at [facility] and will be admitted to [facility] with N/V, seizure like activity; hx ESRD and recently changed from PD to HD. Patient has DM1 and uses an OmniPod insulin pump outpatient for DM control. Tried to call patient's cell phone number to ask about insulin pump but forwarded to voicemail. Sent chat message to [place], RN to ask if patient has on insulin pump. RN checked with patient and patient does have on insulin pump at this time. Sent chat message to Dr. Lorin Mercy and Altha Harm, RN and Dr. Lorin Mercy would prefer patient remove insulin pump and use SQ insulin. Patient will be transferred to [place] in the near  future; Dr. Lorin Mercy gave permission to allow patient to continue with insulin pump until she arrives to [facility] and should then remove insulin pump when Levemir given Edward Qualia, RN added to chat by Altha Harm, RN as she will be patient's nurse on 5[facility]).  Addendum 10/04/22@14 :10-[name] Patient with eyes half open and open completely to name. Patient engaged in conversation but during conversation at times patient's eyes would close half way and patient appeared to drift off but would again open eyes fully to name. Patient has OmniPod insulin pump on left lower abdomen and her Dexcom CGM on left arm. Patient was not able to pull up OmniPod insulin pump settings but confirms that her basal rate is 0.5 units/hour, insulin sensitivity is 1:50 mg/dl, and her insulin to carb ratio is 1:8 grams.   Informed patient that Dr. Lorin Mercy would like to have patient remove her insulin pump and use SQ insulin. Informed patient that once Levemir was sent to the floor that nurse would bring it in to give her and have her remove her insulin pump at that time. Patient's glucose on Dexcom is 157 mg/dl. Patient verbalized understanding of information discussed. Spoke with [place], RN regarding plan to have patient remove her insulin pump when Levemir given and that patient could keep the Dexcom CGM sensor on left arm at this time.  Thanks, Barnie Alderman, RN, MSN, [place] Diabetes Coordinator Inpatient Diabetes Program [phone] (Team Pager from 8am to [place])

## 2022-10-04 NOTE — Progress Notes (Signed)
EEG complete - results pending 

## 2022-10-04 NOTE — ED Triage Notes (Signed)
N/V with lightheadedness. Since yesterday, worse tonight.

## 2022-10-04 NOTE — ED Notes (Signed)
Pt continues to complain of feeling hot, but continues to have a normal temp and limbs feel cool.  Hospitalist is aware.

## 2022-10-04 NOTE — ED Notes (Addendum)
Pt cold to the touch, bear hugger applied.

## 2022-10-04 NOTE — ED Notes (Signed)
Charge RN allowed Pt to ambulate to the restroom.  Upon returning to room, Pt c/o dizziness. Pt educated we will use a bedside commode or take her to the restroom in a wheelchair going forward.

## 2022-10-04 NOTE — Progress Notes (Addendum)
Received patient via [facility] [employer] transportation services. Patient is A&Ox4, lethargic but easy to arouse. Notified hospitalist of patient's elevated HR/BP. CBG: 172. This RN will remove patient's home insulin pump once pharmacy delivers Levemir. Physician will come to assess patient soon. Patient verbalized that her belongings brought from previous facility is with her.   1422: Insulin pump removed & Levemir given. MD aware of patient extremities being cool to touch. Pulses palpable in all extremities. WCTM.

## 2022-10-04 NOTE — ED Notes (Signed)
Pt able to tell me her name and where she is, still drowsy.

## 2022-10-05 ENCOUNTER — Inpatient Hospital Stay (HOSPITAL_COMMUNITY): Payer: Medicare Other

## 2022-10-05 DIAGNOSIS — E1065 Type 1 diabetes mellitus with hyperglycemia: Secondary | ICD-10-CM

## 2022-10-05 DIAGNOSIS — R112 Nausea with vomiting, unspecified: Secondary | ICD-10-CM | POA: Diagnosis not present

## 2022-10-05 DIAGNOSIS — N186 End stage renal disease: Secondary | ICD-10-CM

## 2022-10-05 DIAGNOSIS — R011 Cardiac murmur, unspecified: Secondary | ICD-10-CM

## 2022-10-05 DIAGNOSIS — R569 Unspecified convulsions: Secondary | ICD-10-CM | POA: Diagnosis not present

## 2022-10-05 DIAGNOSIS — I5022 Chronic systolic (congestive) heart failure: Secondary | ICD-10-CM

## 2022-10-05 DIAGNOSIS — F3181 Bipolar II disorder: Secondary | ICD-10-CM | POA: Diagnosis not present

## 2022-10-05 DIAGNOSIS — Z992 Dependence on renal dialysis: Secondary | ICD-10-CM

## 2022-10-05 LAB — ECHOCARDIOGRAM COMPLETE
Area-P 1/2: 5.68 cm2
Calc EF: 14.9 %
Est EF: <20
Height: 60 in
S' Lateral: 4.9 cm
Single Plane A2C EF: 16.1 %
Single Plane A4C EF: 14 %
Weight: 2123.47 oz

## 2022-10-05 LAB — BASIC METABOLIC PANEL
Anion gap: 22 — ABNORMAL HIGH (ref 5–15)
BUN: 53 mg/dL — ABNORMAL HIGH (ref 6–20)
CO2: 15 mmol/L — ABNORMAL LOW (ref 22–32)
Calcium: 8.8 mg/dL — ABNORMAL LOW (ref 8.9–10.3)
Chloride: 102 mmol/L (ref 98–111)
Creatinine, Ser: 8.38 mg/dL — ABNORMAL HIGH (ref 0.44–1.00)
GFR, Estimated: 6 mL/min — ABNORMAL LOW (ref >60)
Glucose, Bld: 180 mg/dL — ABNORMAL HIGH (ref 70–99)
Potassium: 5.3 mmol/L — ABNORMAL HIGH (ref 3.5–5.1)
Sodium: 139 mmol/L (ref 135–145)

## 2022-10-05 LAB — CBC
HCT: 41.9 % (ref 36.0–46.0)
Hemoglobin: 13.2 g/dL (ref 12.0–15.0)
MCH: 32 pg (ref 26.0–34.0)
MCHC: 31.5 g/dL (ref 30.0–36.0)
MCV: 101.5 fL — ABNORMAL HIGH (ref 80.0–100.0)
Platelets: 202 10*3/uL (ref 150–400)
RBC: 4.13 MIL/uL (ref 3.87–5.11)
RDW: 17.3 % — ABNORMAL HIGH (ref 11.5–15.5)
WBC: 5.8 10*3/uL (ref 4.0–10.5)
nRBC: 0 % (ref 0.0–0.2)

## 2022-10-05 LAB — GLUCOSE, CAPILLARY
Glucose-Capillary: 231 mg/dL — ABNORMAL HIGH (ref 70–99)
Glucose-Capillary: 117 mg/dL — ABNORMAL HIGH (ref 70–99)
Glucose-Capillary: 61 mg/dL — ABNORMAL LOW (ref 70–99)
Glucose-Capillary: 82 mg/dL (ref 70–99)
Glucose-Capillary: 177 mg/dL — ABNORMAL HIGH (ref 70–99)
Glucose-Capillary: 50 mg/dL — ABNORMAL LOW (ref 70–99)
Glucose-Capillary: 53 mg/dL — ABNORMAL LOW (ref 70–99)

## 2022-10-05 LAB — HIV ANTIBODY (ROUTINE TESTING W REFLEX): HIV Screen 4th Generation wRfx: NONREACTIVE

## 2022-10-05 LAB — PHOSPHORUS: Phosphorus: 8.2 mg/dL — ABNORMAL HIGH (ref 2.5–4.6)

## 2022-10-05 LAB — ALBUMIN: Albumin: 3.2 g/dL — ABNORMAL LOW (ref 3.5–5.0)

## 2022-10-05 MED ORDER — PROCHLORPERAZINE EDISYLATE 10 MG/2ML IJ SOLN
10.0000 mg | INTRAMUSCULAR | Status: AC | PRN
  Administered 2022-10-05 (×2): 10 mg via INTRAVENOUS
  Filled 2022-10-05 (×2): qty 2

## 2022-10-05 MED ORDER — CALCIUM ACETATE (PHOS BINDER) 667 MG PO CAPS
1334.0000 mg | ORAL_CAPSULE | Freq: Three times a day (TID) | ORAL | Status: DC
  Administered 2022-10-05 – 2022-10-08 (×6): 1334 mg via ORAL
  Filled 2022-10-05 (×8): qty 2

## 2022-10-05 MED ORDER — DEXTROSE 50 % IV SOLN
INTRAVENOUS | Status: AC
  Administered 2022-10-05: 50 mL
  Filled 2022-10-05: qty 50

## 2022-10-05 MED ORDER — HEPARIN SODIUM (PORCINE) 1000 UNIT/ML DIALYSIS
2000.0000 [IU] | Freq: Once | INTRAMUSCULAR | Status: AC
  Administered 2022-10-07: 2000 [IU] via INTRAVENOUS_CENTRAL
  Filled 2022-10-05: qty 2

## 2022-10-05 MED ORDER — SODIUM ZIRCONIUM CYCLOSILICATE 10 G PO PACK: 10.0000 g | PACK | Freq: Once | ORAL | Status: DC

## 2022-10-05 MED ORDER — HEPARIN SODIUM (PORCINE) 1000 UNIT/ML DIALYSIS: 2000.0000 [IU] | INTRAMUSCULAR | Status: DC | PRN

## 2022-10-05 MED ORDER — HEPARIN SODIUM (PORCINE) 1000 UNIT/ML IJ SOLN
INTRAMUSCULAR | Status: AC
  Administered 2022-10-05: 1000 [IU]
  Filled 2022-10-05: qty 8

## 2022-10-05 MED ORDER — SODIUM ZIRCONIUM CYCLOSILICATE 10 G PO PACK
10.0000 g | PACK | Freq: Once | ORAL | Status: AC
  Administered 2022-10-05: 10 g via ORAL
  Filled 2022-10-05: qty 1

## 2022-10-05 MED ORDER — CARVEDILOL 12.5 MG PO TABS
12.5000 mg | ORAL_TABLET | Freq: Two times a day (BID) | ORAL | Status: DC
  Administered 2022-10-05 – 2022-10-08 (×6): 12.5 mg via ORAL
  Filled 2022-10-05 (×7): qty 1

## 2022-10-05 MED ORDER — METOCLOPRAMIDE HCL 10 MG PO TABS
5.0000 mg | ORAL_TABLET | Freq: Three times a day (TID) | ORAL | Status: DC
  Administered 2022-10-05 – 2022-10-08 (×9): 5 mg via ORAL
  Filled 2022-10-05: qty 1
  Filled 2022-10-05: qty 0.5
  Filled 2022-10-05 (×2): qty 1
  Filled 2022-10-05 (×2): qty 0.5
  Filled 2022-10-05 (×2): qty 1
  Filled 2022-10-05: qty 0.5
  Filled 2022-10-05: qty 1

## 2022-10-05 NOTE — Significant Event (Signed)
Hypoglycemic Event  CBG: 55/50  Treatment: 4 oz juice/soda and D50 25 mL (12.5 gm)  Symptoms: Shaky  Follow-up CBG: Time:2215 CBG Result:177  Possible Reasons for Event: Inadequate meal intake  Comments/MD notified: Protocol Follow    Sanjuana Mae

## 2022-10-05 NOTE — Progress Notes (Signed)
Received patient in bed  Alert and oriented.  Informed consent signed and in chart.   [place] clotted during [place].  Patient tolerated well.without acute distress.  Hand-off given to patient's nurse.   Access used: catheter Access issues: none  Total UF removed: 2 L Medication(s) given: none Post HD VS: 130/108 P 102 R 22 Post HD weight: 58.2 KG   [facility]

## 2022-10-05 NOTE — Progress Notes (Signed)
Home BP meds from recent admit Jan 15- 20, 2024 that were dc'd were --> coreg 12.5 bid, hydralazine 100 tid and lisinopril 10 qd. If she needs additional po meds to get off the Cleviprex would titrate these up if added.   Kelly Splinter, MD 10/05/2022, 9:11 AM

## 2022-10-05 NOTE — Progress Notes (Signed)
Pt receives out-pt HD at [facility] on [facility]. Will assist as needed.   Melven Sartorius Renal Navigator [phone]

## 2022-10-05 NOTE — Progress Notes (Signed)
[place] Progress Note Patient Name: Kathy Lewis DOB: 03-Apr-1996 MRN: [id number]   Date of Service  10/05/2022  HPI/Events of Note  Hyperkalemia - K+ = 5.3. ESRD on iHD. Plan for HD today?  eICU Interventions  Plan: Lokelma 10 gm PO now. Further management per renal service.      Intervention Category Major Interventions: Electrolyte abnormality - evaluation and management  [name] 10/05/2022, 5:48 AM

## 2022-10-05 NOTE — Progress Notes (Signed)
[place] Progress Note Patient Name: Kathy Lewis DOB: 09-Jul-1996 MRN: [id number]   Date of Service  10/05/2022  HPI/Events of Note  Atarax not effective for nausea. Prolonged QT interval. Not able to use Zofran.  eICU Interventions  Plan: Compazine 10 mg IV Q 4 hours PRN X 2 doses.      Intervention Category Major Interventions: Other:  Lysle Dingwall 10/05/2022, 12:48 AM

## 2022-10-05 NOTE — Progress Notes (Signed)
[facility] Progress Note   Subjective: Seen on HD - 2L UFG and tolerating. Still tachycardic but better from yesterday - remains in neuro ICU.  No vomiting this AM. Still tired, but wakes easy and answers questions. Echo from this AM has not been read yet at time of my note.  Objective Vitals:   10/05/22 1015 10/05/22 1030 10/05/22 1045 10/05/22 1100  BP: (!) 123/108 (!) 124/107 (!) 121/97 (!) 128/103  Pulse: (!) 114  (!) 113 (!) 113  Resp: 15 18 19 20   Temp:      TempSrc:      SpO2: 97%  97% 96%  Weight:      Height:       Physical Exam General: Well appearing woman, NAD. Falls asleep, but wakes easily to answer questions. Heart:Tachycardic and irregular on auscultation, regular on telemetry; no murmur Lungs: CTA anteriorly Abdomen: soft Extremities: no LE edema Dialysis Access: R TDC; mild dried blood around exit site  Additional Objective Labs: Basic Metabolic Panel: Recent Labs  Lab 10/03/22 1142 10/04/22 0322 10/04/22 0326 10/05/22 0332  NA 134* 136 136 139  K 3.6 4.4 4.4 5.3*  CL 101 101  --  102  CO2 24 21*  --  15*  GLUCOSE 155* 169*  --  180*  BUN 34* 39*  --  53*  CREATININE 5.83* 7.04*  --  8.38*  CALCIUM 7.7* 8.5*  --  8.8*  PHOS  --   --   --  8.2*   Liver Function Tests: Recent Labs  Lab 10/05/22 0332  ALBUMIN 3.2*   CBC: Recent Labs  Lab 10/03/22 1142 10/04/22 0303 10/04/22 0326 10/05/22 0332  WBC 5.0 4.2  --  5.8  NEUTROABS  --  3.0  --   --   HGB 13.8 12.5 14.3 13.2  HCT 43.9 40.7 42.0 41.9  MCV 100.0 102.0*  --  101.5*  PLT 155 172  --  202   CBG: Recent Labs  Lab 10/04/22 1940 10/04/22 2147 10/04/22 2222 10/04/22 2328 10/05/22 0740  GLUCAP 75 53* 184* 160* 231*   Studies/Results: MR BRAIN W WO CONTRAST  Result Date: 10/04/2022 CLINICAL DATA:  Initial evaluation for altered mental status. EXAM: MRI HEAD WITHOUT AND WITH CONTRAST TECHNIQUE: Multiplanar, multiecho pulse sequences of the brain and surrounding  structures were obtained without and with intravenous contrast. CONTRAST:  52mL GADAVIST GADOBUTROL 1 MMOL/ML IV SOLN COMPARISON:  Prior CT from earlier the same day. FINDINGS: Brain: Examination moderately degraded by motion artifact. Cerebral volume within normal limits. Mild hazy FLAIR signal intensity involving the periventricular white matter, nonspecific, but most likely reflecting mild and/or early changes of chronic microvascular ischemic disease. Small remote infarct involving the superior right cerebellum noted (series 7, image 12). No evidence for acute or subacute ischemia. Gray-white matter differentiation maintained. No acute or chronic intracranial blood products. No mass lesion, midline shift or mass effect no hydrocephalus or extra-axial fluid collection. Pituitary gland and suprasellar region. No visible abnormal enhancement. Vascular: Major intracranial vascular flow voids are maintained. Skull and upper cervical spine: Craniocervical junction within normal limits. Bone marrow signal intensity normal. No scalp soft tissue abnormality. Sinuses/Orbits: Globes and orbital soft tissues within normal limits. Paranasal sinuses are largely clear. No mastoid effusion. Other: None. IMPRESSION: 1. No acute intracranial abnormality. 2. Small remote right cerebellar infarct. 3. Mild hazy FLAIR signal intensity involving the periventricular white matter, nonspecific, but most likely reflecting mild/early changes of chronic microvascular ischemic disease. Electronically Signed  By: Jeannine Boga M.D.   On: 10/04/2022 20:56   EEG adult  Result Date: 10/04/2022 Lora Havens, MD     10/04/2022  5:49 PM Patient Name: Kathy Lewis MRN: [id number] Epilepsy Attending: Lora Havens Referring Physician/Provider: Karmen Bongo, MD Date: 10/04/2022 Duration: 23.26 mins Patient history: 27yo F with ams. EEG to evaluate for seizure Level of alertness: Awake, asleep AEDs during EEG study: Clonazepam  Technical aspects: This EEG study was done with scalp electrodes positioned according to the 10-20 International system of electrode placement. Electrical activity was reviewed with band pass filter of 1-70Hz , sensitivity of 7 uV/mm, display speed of 94mm/sec with a 60Hz  notched filter applied as appropriate. EEG data were recorded continuously and digitally stored.  Video monitoring was available and reviewed as appropriate. Description: The posterior dominant rhythm consists of 9Hz  activity of moderate voltage (25-35 uV) seen predominantly in posterior head regions, symmetric and reactive to eye opening and eye closing. Sleep was characterized by vertex waves, sleep spindles (12 to 14 Hz), maximal frontocentral region.  Hyperventilation and photic stimulation were not performed.   IMPRESSION: This study is within normal limits. No seizures or epileptiform discharges were seen throughout the recording. Lora Havens   CT Head Wo Contrast  Result Date: 10/04/2022 CLINICAL DATA:  27 year old female with seizure-like activity, nausea vomiting. Type 1 diabetes. EXAM: CT HEAD WITHOUT CONTRAST TECHNIQUE: Contiguous axial images were obtained from the base of the skull through the vertex without intravenous contrast. RADIATION DOSE REDUCTION: This exam was performed according to the departmental dose-optimization program which includes automated exposure control, adjustment of the mA and/or kV according to patient size and/or use of iterative reconstruction technique. COMPARISON:  None Available. FINDINGS: Brain: No midline shift, ventriculomegaly, mass effect, evidence of mass lesion, intracranial hemorrhage or evidence of cortically based acute infarction. Small cavum septum pellucidum, normal variant. Small chronic appearing, circumscribed infarct of the right superior cerebellar artery territory (coronal image 23). No cerebral cortical encephalomalacia identified and elsewhere gray-white differentiation is  within normal limits. Vascular: Very faint Calcified atherosclerosis at the skull base. Skull: Negative. Sinuses/Orbits: Visualized paranasal sinuses and mastoids are clear. Other: Visualized orbits and scalp soft tissues are within normal limits. IMPRESSION: 1. 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 2. No acute intracranial abnormality. And otherwise negative noncontrast CT appearance of the brain. Electronically Signed   By: Genevie Ann M.D.   On: 10/04/2022 05:08    Medications:  clevidipine Stopped (10/05/22 0936)    amitriptyline  50 mg Oral QHS   atorvastatin  40 mg Oral QPM   carvedilol  12.5 mg Oral BID WC   Chlorhexidine Gluconate Cloth  6 each Topical Q0600   [START ON 10/06/2022] heparin  2,000 Units Dialysis Once in dialysis   heparin  5,000 Units Subcutaneous Q8H   heparin sodium (porcine)       insulin aspart  0-5 Units Subcutaneous QHS   insulin aspart  0-6 Units Subcutaneous TID WC   insulin detemir  5 Units Subcutaneous Daily   metoCLOPramide  5 mg Oral TID AC   sodium chloride flush  3 mL Intravenous Q12H    Dialysis Orders: [facility] Dr Olivia Mackie 3.5hr, EDW 58kg, RIJ TDC, Hep 2000 + 500u/hr    Assessment/Plan: Nausea/vomiting/?gastroparesis: Better today per patient. T1DM with hypoglycemic seizure in ED: Neuro following. MRI without acute CVA. Somnolence: Still falling asleep during conversation, but more easily waken. Sinus tachycardia: Echo 2/8 pending. Workup ongoing - restarted on prior  BB (which had been held last admit) ESRD: Recently changed from PD to HD on 09/14/22. Continue HD on TTS schedule - HD now - UF as tolerated. HTN/volume: BP remains high, albeit much improved. Does not appear overloaded. Several BP meds stopped last admit (Coreg, lisinopril, hydralazine) -> coreg restarted, will follow.  Anemia of ESRD: Hgb 13.2 - no ESA needed for now. Secondary HPTH: Ca ok, Phos high - appears her binder is Phoslo -> restart 2/meals. Severe  NICM (EF 13-20%): Repeat echo pending. Hx chronic appearing R superior cerebellar infarct per CT/MRI   Veneta Penton, PA-C 10/05/2022, 11:06 AM  [facility]

## 2022-10-05 NOTE — Progress Notes (Signed)
NAME:  Kathy Lewis, MRN:  [id number], DOB:  Jul 27, 1996, LOS: 1 ADMISSION DATE:  10/04/2022, CONSULTATION DATE:  10/04/22 REFERRING MD:  Dr. Lorin Mercy, CHIEF COMPLAINT:  Hypertension    History of Present Illness:  Kathy Lewis is a 27 year old female with a past medical history significant for type 1 diabetes, end-stage renal disease, bipolar type II, chronic systolic congestive heart failure, anxiety, and depression presented to the emergency department 2/6 with reports of nausea, vomiting, and hyperglycemia.  Of note patient recently switched from PD to intermittent hemodialysis 3 weeks ago and was unable to complete her HD session day prior to admission to nausea, vomiting, and lightheadedness.  On ED arrival patient was seen hypoglycemic with blood sugar 44 and mildly hypothermic.  There is also some concern for seizure-like activity in the ED for which she received Ativan.  CT head on admission with questionable cerebellar lesion.   Mid afternoon 2/7 patient was seen with persistent tachycardia and severe hypertension resulting in critical care consultation.  Decision made to move patient to ICU for close monitoring and Cleviprex drip  Pertinent  Medical History  Type 1 diabetes, end-stage renal disease, bipolar type II, chronic systolic congestive heart failure, anxiety, and depression   Significant Hospital Events: Including procedures, antibiotic start and stop dates in addition to other pertinent events   2/7 PCCM consulted for transfer ICU and Cleviprex drip  Interim History / Subjective:  Patient remains on Cleviprex infusion, still tachycardic and hypertensive Overnight she became hypoglycemic with fingerstick 53 Continue to complain of nausea  Objective   Blood pressure 122/89, pulse (!) 116, temperature 98.3 F (36.8 C), temperature source Oral, resp. rate 19, height 5' (1.524 m), weight 60.2 kg, SpO2 100 %.        Intake/Output Summary (Last 24 hours) at 10/05/2022 0803 Last  data filed at 10/05/2022 0600 Gross per 24 hour  Intake 536.52 ml  Output 100 ml  Net 436.52 ml   Filed Weights   10/04/22 0252 10/04/22 1900 10/05/22 0500  Weight: 60.7 kg 59.6 kg 60.2 kg    Examination: Physical exam: General: Acute on chronically ill-appearing female, lying on the bed HEENT: [place]/AT, eyes anicteric.  moist mucus membranes Neuro: Sleepy, opens eyes with vocal stimuli, following simple commands  Chest: Coarse breath sounds, no wheezes or rhonchi Heart: Tachycardic, regular rhythm, no murmurs or gallops Abdomen: Soft, nontender, nondistended, bowel sounds present Skin: No rash  Labs and images were reviewed  Resolved Hospital Problem list     Assessment & Plan:  Poorly controlled diabetes type 1 with gastroparesis and recurrent hypoglycemia QTc prolongation Most recent A1c 06/27/2022 8.6 She had 2 episodes of hypoglycemia so far likely because of poor oral intake Overnight Levemir was decreased to 5 units twice daily Continue sliding scale Complaining of nausea likely due to gastroparesis Added Reglan Continue telemetry, monitor QTc  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 Acute stroke was ruled out Seizure-like activity likely in the setting of hypoglycemia Closely monitor EEG was negative for active seizures  End-stage liver disease on hemodialysis Nephrology is following Patient will receive hemodialysis today She received Lokelma last night for serum potassium of 5.3  Chronic HFrEF Echocardiogram March 2023 EF 20% with global hypokinesis Hyperlipidemia Uncontrolled hypertension Patient was taken off of antihypertensive meds on last admission She presented with uncontrolled hypertension especially diastolic blood pressure was ranging in 120s Currently on Cleviprex infusion She is tachycardic and hypertensive, started on Coreg that will help with the blood pressure, tachycardia and improves  mortality for systolic CHF Monitor intake and output  Mood  disorder including type II bipolar disease Continue amitriptyline   Best Practice (right click and "Reselect all SmartList Selections" daily)   Diet/type: Regular consistency (see orders) DVT prophylaxis: Subcu heparin GI prophylaxis: N/A Lines: N/A Foley:  N/A Code Status:  full code Last date of multidisciplinary goals of care discussion: 2/8: Patient and her mother were updated at bedside, decision was to continue full scope of care   Labs   CBC: Recent Labs  Lab 10/03/22 1142 10/04/22 0303 10/04/22 0326 10/05/22 0332  WBC 5.0 4.2  --  5.8  NEUTROABS  --  3.0  --   --   HGB 13.8 12.5 14.3 13.2  HCT 43.9 40.7 42.0 41.9  MCV 100.0 102.0*  --  101.5*  PLT 155 172  --  856    Basic Metabolic Panel: Recent Labs  Lab 10/03/22 1142 10/04/22 0315 10/04/22 0322 10/04/22 0326 10/05/22 0332  NA 134*  --  136 136 139  K 3.6  --  4.4 4.4 5.3*  CL 101  --  101  --  102  CO2 24  --  21*  --  15*  GLUCOSE 155*  --  169*  --  180*  BUN 34*  --  39*  --  53*  CREATININE 5.83*  --  7.04*  --  8.38*  CALCIUM 7.7*  --  8.5*  --  8.8*  MG  --  2.0  --   --   --    GFR: Estimated Creatinine Clearance: 8.3 mL/min (A) (by C-G formula based on SCr of 8.38 mg/dL (H)). Recent Labs  Lab 10/03/22 1142 10/04/22 0303 10/05/22 0332  WBC 5.0 4.2 5.8    Liver Function Tests: No results for input(s): "AST", "ALT", "ALKPHOS", "BILITOT", "PROT", "ALBUMIN" in the last 168 hours. No results for input(s): "LIPASE", "AMYLASE" in the last 168 hours. No results for input(s): "AMMONIA" in the last 168 hours.  ABG    Component Value Date/Time   PHART 7.410 06/17/2019 1040   PCO2ART 38.3 06/17/2019 1040   PO2ART 96.5 06/17/2019 1040   HCO3 22.8 10/04/2022 0326   TCO2 24 10/04/2022 0326   ACIDBASEDEF 2.0 10/04/2022 0326   O2SAT 54 10/04/2022 0326     Coagulation Profile: No results for input(s): "INR", "PROTIME" in the last 168 hours.  Cardiac Enzymes: No results for input(s):  "CKTOTAL", "CKMB", "CKMBINDEX", "TROPONINI" in the last 168 hours.  HbA1C: Hgb A1c MFr Bld  Date/Time Value Ref Range Status  06/27/2022 11:37 PM 8.6 (H) 4.8 - 5.6 % Corrected    Comment:    (NOTE)         Prediabetes: 5.7 - 6.4         Diabetes: >6.4         Glycemic control for adults with diabetes: <7.0 CORRECTED ON 11/02 AT 0035: PREVIOUSLY REPORTED AS 8.1   11/23/2021 06:28 PM 9.2 (H) 4.8 - 5.6 % Final    Comment:    (NOTE) Pre diabetes:          5.7%-6.4%  Diabetes:              >6.4%  Glycemic control for   <7.0% adults with diabetes     CBG: Recent Labs  Lab 10/04/22 1940 10/04/22 2147 10/04/22 2222 10/04/22 2328 10/05/22 0740  GLUCAP 75 53* 184* 160* 231*    This patient is critically ill with multiple organ system failure which  requires frequent high complexity decision making, assessment, support, evaluation, and titration of therapies. This was completed through the application of advanced monitoring technologies and extensive interpretation of multiple databases.  During this encounter critical care time was devoted to patient care services described in this note for 34 minutes.    Jacky Kindle, MD [facility] See Amion for pager If no response to pager, please call [phone] until 7pm After 7pm, Please call E-link [phone]

## 2022-10-05 NOTE — Progress Notes (Signed)
  Echocardiogram 2D Echocardiogram has been performed.  Kathy Lewis 10/05/2022, 9:33 AM

## 2022-10-06 DIAGNOSIS — E1065 Type 1 diabetes mellitus with hyperglycemia: Secondary | ICD-10-CM | POA: Diagnosis not present

## 2022-10-06 DIAGNOSIS — I10 Essential (primary) hypertension: Secondary | ICD-10-CM

## 2022-10-06 DIAGNOSIS — E785 Hyperlipidemia, unspecified: Secondary | ICD-10-CM

## 2022-10-06 DIAGNOSIS — N186 End stage renal disease: Secondary | ICD-10-CM | POA: Diagnosis not present

## 2022-10-06 DIAGNOSIS — R112 Nausea with vomiting, unspecified: Secondary | ICD-10-CM | POA: Diagnosis not present

## 2022-10-06 LAB — GLUCOSE, CAPILLARY
Glucose-Capillary: 131 mg/dL — ABNORMAL HIGH (ref 70–99)
Glucose-Capillary: 201 mg/dL — ABNORMAL HIGH (ref 70–99)
Glucose-Capillary: 45 mg/dL — ABNORMAL LOW (ref 70–99)
Glucose-Capillary: 48 mg/dL — ABNORMAL LOW (ref 70–99)
Glucose-Capillary: 67 mg/dL — ABNORMAL LOW (ref 70–99)
Glucose-Capillary: 73 mg/dL (ref 70–99)
Glucose-Capillary: 82 mg/dL (ref 70–99)
Glucose-Capillary: 92 mg/dL (ref 70–99)

## 2022-10-06 MED ORDER — INSULIN ASPART 100 UNIT/ML IJ SOLN: 1.0000 [IU] | Freq: Three times a day (TID) | INTRAMUSCULAR | Status: DC

## 2022-10-06 MED ORDER — INSULIN DETEMIR 100 UNIT/ML ~~LOC~~ SOLN
3.0000 [IU] | Freq: Every day | SUBCUTANEOUS | Status: DC
  Administered 2022-10-07 – 2022-10-08 (×2): 3 [IU] via SUBCUTANEOUS
  Filled 2022-10-06 (×2): qty 0.03

## 2022-10-06 MED ORDER — GLUCOSE 40 % PO GEL
1.0000 | Freq: Once | ORAL | Status: AC
  Administered 2022-10-06: 31 g via ORAL
  Filled 2022-10-06: qty 1.21

## 2022-10-06 MED ORDER — AMLODIPINE BESYLATE 5 MG PO TABS
5.0000 mg | ORAL_TABLET | Freq: Every day | ORAL | Status: DC
  Administered 2022-10-06 – 2022-10-07 (×2): 5 mg via ORAL
  Filled 2022-10-06 (×2): qty 1

## 2022-10-06 MED ORDER — CHLORHEXIDINE GLUCONATE CLOTH 2 % EX PADS
6.0000 | MEDICATED_PAD | Freq: Every day | CUTANEOUS | Status: DC
  Administered 2022-10-06 – 2022-10-08 (×2): 6 via TOPICAL

## 2022-10-06 NOTE — Progress Notes (Signed)
Heart Failure Navigator Progress Note  Assessed for Heart & Vascular TOC clinic readiness.  Patient does not meet criteria due to ESRD on hemodialysis.   Navigator will sign off at this time.    Lyrica Mcclarty, BSN, RN Heart Failure Nurse Navigator Secure Chat Only   

## 2022-10-06 NOTE — TOC Initial Note (Addendum)
Transition of Care [facility]) - Initial/Assessment Note    Patient Details  Name: Kathy Lewis MRN: [id number] Date of Birth: 02/26/1996  [facility] CM/SW Contact:    Pollie Friar, RN Phone Number: 10/06/2022, 11:43 AM  Clinical Narrative:    PCP; [place]       Pt receives out-pt HD at [facility] on TTS             Pt states she is from home alone. Pt drives self as needed.  She doesn't use any DME at home.  Her mother and sister can provide intermittent supervision. Pt and mother inquired about medicaid aide for home. CM has printed off the form for [facility] PCS services and will email the form. They will call and set up an appointment to assess the patient.  TOC following for further d/c needs.  Expected Discharge Plan: Home/Self Care Barriers to Discharge: Continued Medical Work up   Patient Goals and CMS Choice            Expected Discharge Plan and Services   Discharge Planning Services: CM Consult   Living arrangements for the past 2 months: Apartment                                      Prior Living Arrangements/Services Living arrangements for the past 2 months: Apartment Lives with:: Self Patient language and need for interpreter reviewed:: Yes Do you feel safe going back to the place where you live?: Yes            Criminal Activity/Legal Involvement Pertinent to Current Situation/Hospitalization: No - Comment as needed  Activities of Daily Living Home Assistive Devices/Equipment: Blood pressure cuff, CBG Meter, Insulin Pump ADL Screening (condition at time of admission) Patient's cognitive ability adequate to safely complete daily activities?: Yes Is the patient deaf or have difficulty hearing?: No Does the patient have difficulty seeing, even when wearing glasses/contacts?: No Does the patient have difficulty concentrating, remembering, or making decisions?: No Patient able to  express need for assistance with ADLs?: Yes Does the patient have difficulty dressing or bathing?: No Independently performs ADLs?: Yes (appropriate for developmental age) Does the patient have difficulty walking or climbing stairs?: Yes (sometimes) Weakness of Legs: Both (general) Weakness of Arms/Hands: Both (general)  Permission Sought/Granted                  Emotional Assessment Appearance:: Appears stated age Attitude/Demeanor/Rapport: Engaged Affect (typically observed): Accepting Orientation: : Oriented to Self, Oriented to Place, Oriented to  Time, Oriented to Situation   Psych Involvement: No (comment)  Admission diagnosis:  Cerebellar infarct ([place]) [I63.9] Generalized weakness [R53.1] Nausea and vomiting in adult [R11.2] Observed seizure-like activity ([place]) [R56.9] Seizure-like activity ([place]) [R56.9] Intractable vomiting with nausea [R11.2] Patient Active Problem List   Diagnosis Date Noted   Seizure-like activity ([place]) 10/04/2022   Intractable vomiting with nausea [id number]   Cyclical vomiting with nausea [id number]   Acute systolic heart failure ([place]) 09/12/2022   Acute on chronic systolic heart failure ([place]) [id number]   Metabolic acidosis, increased anion gap 07/28/2022   Elevated LFTs 07/28/2022   Hyperglycemia 06/30/2022   DKA (diabetic ketoacidosis) ([place]) 06/28/2022   Hypertensive urgency 06/27/2022   DKA, type 1 ([place]) 06/26/2022   Nausea & vomiting 03/15/2022   Mixed diabetic hyperlipidemia associated with type 1 diabetes mellitus ([place]) 01/31/2022  Intractable hiccups 01/31/2022   Abdominal pain with vomiting 01/24/2022   Uncontrolled type 1 diabetes mellitus with hyperglycemia, with long-term current use of insulin ([place]) 01/24/2022   ESRD on hemodialysis ([place]) 11/23/2021   Abnormal CT of the chest 11/23/2021   Gastroparesis 11/22/2021   Pulmonary embolism ([place]) 11/22/2021   Hyperkalemia 08/11/2021   Bipolar 2 disorder ([place])    Essential  hypertension    Chronic systolic CHF (congestive heart failure) ([place]) 07/11/2020   Hypoalbuminemia    Anxiety    Normocytic anemia    Prolonged QT interval    Dyslipidemia 05/18/2020   Preeclampsia 10/15/2019   Hypokalemia    Noncompliance with medications 12/18/2014   Intractable nausea and vomiting 12/18/2014   PCP:  [place]:   [place], [place] [address] Phone: [phone] Fax: [place] [address], [place] [address] Phone: [phone] Fax: [phone]     Social Determinants of Health (SDOH) Social History: [place]: No Food Insecurity (10/04/2022)  Housing: Low Risk  (10/04/2022)  Transportation Needs: No Transportation Needs (10/04/2022)  Utilities: Not At Risk (10/04/2022)  Depression (PHQ2-9): High Risk (08/30/2022)  Tobacco Use: Medium Risk (10/04/2022)   SDOH Interventions:     Readmission Risk Interventions    01/31/2022   11:13 AM 08/12/2021    1:11 PM  Readmission Risk Prevention Plan  Transportation Screening Complete Complete  Medication Review (RN Care Manager) Referral to Pharmacy Complete  PCP or Specialist appointment within 3-5 days of discharge Complete Complete  HRI or [place] Complete Complete  SW Recovery Care/Counseling Consult Complete Complete  Palliative Care Screening Not Applicable Not [place] Not Applicable Not Applicable

## 2022-10-06 NOTE — Progress Notes (Signed)
[facility] Progress Note   Subjective:   Feeling better today, was able to eat a little bit. Denies SOB, CP, palpitations and dizziness.   Objective Vitals:   10/05/22 2054 10/05/22 2327 10/06/22 0340 10/06/22 0839  BP: 96/71 (!) 120/93 (!) 115/97 (!) 110/94  Pulse: 90 95 98 (!) 101  Resp: 16 16 16   $ Temp: 99 F (37.2 C) 98.8 F (37.1 C) 98.6 F (37 C)   TempSrc:  Oral Oral   SpO2: 96% 96% 98% 95%  Weight:      Height:       Physical Exam General: Well appearing female, alert and in NAD Heart: RRR, no murmurs, rubs or gallops Lungs: CTA bilaterally  Abdomen: Non-distended Extremities: No edema b/l lower extremities Dialysis Access: [facility]  Additional Objective Labs: Basic Metabolic Panel: Recent Labs  Lab 10/03/22 1142 10/04/22 0322 10/04/22 0326 10/05/22 0332  NA 134* 136 136 139  K 3.6 4.4 4.4 5.3*  CL 101 101  --  102  CO2 24 21*  --  15*  GLUCOSE 155* 169*  --  180*  BUN 34* 39*  --  53*  CREATININE 5.83* 7.04*  --  8.38*  CALCIUM 7.7* 8.5*  --  8.8*  PHOS  --   --   --  8.2*   Liver Function Tests: Recent Labs  Lab 10/05/22 0332  ALBUMIN 3.2*   No results for input(s): "LIPASE", "AMYLASE" in the last 168 hours. CBC: Recent Labs  Lab 10/03/22 1142 10/04/22 0303 10/04/22 0326 10/05/22 0332  WBC 5.0 4.2  --  5.8  NEUTROABS  --  3.0  --   --   HGB 13.8 12.5 14.3 13.2  HCT 43.9 40.7 42.0 41.9  MCV 100.0 102.0*  --  101.5*  PLT 155 172  --  202   Blood Culture    Component Value Date/Time   SDES  03/15/2022 1854    URINE, CLEAN CATCH Performed at [facility], [address] [place], [place] [address]    [facility]  03/15/2022 1854    NONE Performed at [facility], [address] [place], [place] [address]    CULT MULTIPLE SPECIES PRESENT, SUGGEST RECOLLECTION (A) 03/15/2022 1854   REPTSTATUS 03/16/2022 FINAL 03/15/2022 1854    Cardiac Enzymes: No results for input(s): "CKTOTAL", "CKMB",  "CKMBINDEX", "TROPONINI" in the last 168 hours. CBG: Recent Labs  Lab 10/05/22 1642 10/05/22 2133 10/05/22 2157 10/05/22 2215 10/06/22 0602  GLUCAP 82 53* 50* 177* 201*   Iron Studies: No results for input(s): "IRON", "TIBC", "TRANSFERRIN", "FERRITIN" in the last 72 hours. @lablastinr3$ @ Studies/Results: ECHOCARDIOGRAM COMPLETE  Result Date: 10/05/2022    ECHOCARDIOGRAM REPORT   Patient Name:   Kathy Lewis Date of Exam: 10/05/2022 Medical Rec #:  [id number]     Height:       60.0 in Accession #:    [id number]    Weight:       132.7 lb Date of Birth:  09-25-1995     BSA:          1.568 m Patient Age:    27 years      BP:           122/89 mmHg Patient Gender: F             HR:           118 bpm. Exam Location:  Inpatient Procedure: 2D Echo, 3D Echo, Cardiac Doppler and  Color Doppler Indications:    ; R01.1 Murmur  History:        Patient has prior history of Echocardiogram examinations, most                 recent 11/23/2021. CHF, Signs/Symptoms:Altered Mental Status;                 Risk Factors:Diabetes, Dyslipidemia and Hypertension. ESRD.                 Pulmonary embolus.  Sonographer:    Roseanna Rainbow RDCS Referring Phys: Karmen Bongo IMPRESSIONS  1. Left ventricular ejection fraction, by estimation, is <20%. The left ventricle has severely decreased function. The left ventricle demonstrates global hypokinesis. The left ventricular internal cavity size was mildly dilated. Left ventricular diastolic parameters are consistent with Grade III diastolic dysfunction (restrictive). Elevated left atrial pressure.  2. Right ventricular systolic function is moderately reduced. The right ventricular size is normal. There is moderately elevated pulmonary artery systolic pressure.  3. Left atrial size was mildly dilated.  4. Right atrial size was mildly dilated.  5. A small pericardial effusion is present.  6. The mitral valve is normal in structure. Mild mitral valve regurgitation. No evidence of mitral stenosis.   7. Tricuspid valve regurgitation is moderate.  8. The aortic valve is tricuspid. Aortic valve regurgitation is not visualized. No aortic stenosis is present.  9. The inferior vena cava is dilated in size with <50% respiratory variability, suggesting right atrial pressure of 15 mmHg. Comparison(s): No significant change from prior study. FINDINGS  Left Ventricle: Left ventricular ejection fraction, by estimation, is <20%. The left ventricle has severely decreased function. The left ventricle demonstrates global hypokinesis. The left ventricular internal cavity size was mildly dilated. There is no  left ventricular hypertrophy. Left ventricular diastolic parameters are consistent with Grade III diastolic dysfunction (restrictive). Elevated left atrial pressure. Right Ventricle: The right ventricular size is normal. Right ventricular systolic function is moderately reduced. There is moderately elevated pulmonary artery systolic pressure. The tricuspid regurgitant velocity is 3.21 m/s, and with an assumed right atrial pressure of 15 mmHg, the estimated right ventricular systolic pressure is [id number] mmHg. Left Atrium: Left atrial size was mildly dilated. Right Atrium: Right atrial size was mildly dilated. Pericardium: A small pericardial effusion is present. Mitral Valve: The mitral valve is normal in structure. Mild mitral valve regurgitation. No evidence of mitral valve stenosis. Tricuspid Valve: The tricuspid valve is normal in structure. Tricuspid valve regurgitation is moderate . No evidence of tricuspid stenosis. Aortic Valve: The aortic valve is tricuspid. Aortic valve regurgitation is not visualized. No aortic stenosis is present. Pulmonic Valve: The pulmonic valve was normal in structure. Pulmonic valve regurgitation is mild to moderate. No evidence of pulmonic stenosis. Aorta: The aortic root is normal in size and structure. Venous: The inferior vena cava is dilated in size with less than 50% respiratory  variability, suggesting right atrial pressure of 15 mmHg. IAS/Shunts: No atrial level shunt detected by color flow Doppler. Additional Comments: A venous catheter is visualized.  LEFT VENTRICLE PLAX 2D LVIDd:         5.20 cm      Diastology LVIDs:         4.90 cm      LV e' medial:    5.52 cm/s LV PW:         1.50 cm      LV E/e' medial:  19.9 LV IVS:  0.80 cm      LV e' lateral:   12.50 cm/s LVOT diam:     1.80 cm      LV E/e' lateral: 8.8 LV SV:         22 LV SV Index:   14 LVOT Area:     2.54 cm                              3D Volume EF: LV Volumes (MOD)            3D EF:        20 % LV vol d, MOD A2C: 161.0 ml LV EDV:       155 ml LV vol d, MOD A4C: 142.5 ml LV ESV:       125 ml LV vol s, MOD A2C: 135.0 ml LV SV:        30 ml LV vol s, MOD A4C: 122.5 ml LV SV MOD A2C:     26.0 ml LV SV MOD A4C:     142.5 ml LV SV MOD BP:      23.3 ml RIGHT VENTRICLE            IVC RV S prime:     8.70 cm/s  IVC diam: 2.60 cm TAPSE (M-mode): 1.0 cm LEFT ATRIUM             Index        RIGHT ATRIUM           Index LA diam:        4.20 cm 2.68 cm/m   RA Area:     20.40 cm LA Vol (A2C):   58.9 ml 37.56 ml/m  RA Volume:   58.00 ml  36.99 ml/m LA Vol (A4C):   59.0 ml 37.63 ml/m LA Biplane Vol: 59.4 ml 37.88 ml/m  AORTIC VALVE             PULMONIC VALVE LVOT Vmax:   69.40 cm/s  PR End Diast Vel: 2.76 msec LVOT Vmean:  46.000 cm/s LVOT VTI:    0.087 m  AORTA Ao Root diam: 2.80 cm MITRAL VALVE                TRICUSPID VALVE MV Area (PHT): 5.68 cm     TR Peak grad:   41.2 mmHg MV Decel Time: 134 msec     TR Vmax:        321.00 cm/s MV E velocity: 110.00 cm/s                             SHUNTS                             Systemic VTI:  0.09 m                             Systemic Diam: 1.80 cm Kirk Ruths MD Electronically signed by Kirk Ruths MD Signature Date/Time: 10/05/2022/12:01:16 PM    Final    MR BRAIN W WO CONTRAST  Result Date: 10/04/2022 CLINICAL DATA:  Initial evaluation for altered mental status. EXAM: MRI  HEAD WITHOUT AND WITH CONTRAST TECHNIQUE: Multiplanar, multiecho pulse sequences of the brain and surrounding structures were obtained without and with intravenous contrast. CONTRAST:  57m GADAVIST GADOBUTROL  1 MMOL/ML IV SOLN COMPARISON:  Prior CT from earlier the same day. FINDINGS: Brain: Examination moderately degraded by motion artifact. Cerebral volume within normal limits. Mild hazy FLAIR signal intensity involving the periventricular white matter, nonspecific, but most likely reflecting mild and/or early changes of chronic microvascular ischemic disease. Small remote infarct involving the superior right cerebellum noted (series 7, image 12). No evidence for acute or subacute ischemia. Gray-white matter differentiation maintained. No acute or chronic intracranial blood products. No mass lesion, midline shift or mass effect no hydrocephalus or extra-axial fluid collection. Pituitary gland and suprasellar region. No visible abnormal enhancement. Vascular: Major intracranial vascular flow voids are maintained. Skull and upper cervical spine: Craniocervical junction within normal limits. Bone marrow signal intensity normal. No scalp soft tissue abnormality. Sinuses/Orbits: Globes and orbital soft tissues within normal limits. Paranasal sinuses are largely clear. No mastoid effusion. Other: None. IMPRESSION: 1. No acute intracranial abnormality. 2. Small remote right cerebellar infarct. 3. Mild hazy FLAIR signal intensity involving the periventricular white matter, nonspecific, but most likely reflecting mild/early changes of chronic microvascular ischemic disease. Electronically Signed   By: Jeannine Boga M.D.   On: 10/04/2022 20:56   EEG adult  Result Date: 10/04/2022 Lora Havens, MD     10/04/2022  5:49 PM Patient Name: Kathy Lewis MRN: [id number] Epilepsy Attending: Lora Havens Referring Physician/Provider: Karmen Bongo, MD Date: 10/04/2022 Duration: 23.26 mins Patient history: 27yo F with  ams. EEG to evaluate for seizure Level of alertness: Awake, asleep AEDs during EEG study: Clonazepam Technical aspects: This EEG study was done with scalp electrodes positioned according to the 10-20 International system of electrode placement. Electrical activity was reviewed with band pass filter of 1-70Hz$ , sensitivity of 7 uV/mm, display speed of 65m/sec with a 60Hz$  notched filter applied as appropriate. EEG data were recorded continuously and digitally stored.  Video monitoring was available and reviewed as appropriate. Description: The posterior dominant rhythm consists of 9Hz$  activity of moderate voltage (25-35 uV) seen predominantly in posterior head regions, symmetric and reactive to eye opening and eye closing. Sleep was characterized by vertex waves, sleep spindles (12 to 14 Hz), maximal frontocentral region.  Hyperventilation and photic stimulation were not performed.   IMPRESSION: This study is within normal limits. No seizures or epileptiform discharges were seen throughout the recording. Priyanka OBarbra Sarks  Medications:  clevidipine Stopped (10/05/22 0936)    amitriptyline  50 mg Oral QHS   atorvastatin  40 mg Oral QPM   calcium acetate  1,334 mg Oral TID WC   carvedilol  12.5 mg Oral BID WC   Chlorhexidine Gluconate Cloth  6 each Topical Q0600   heparin  2,000 Units Dialysis Once in dialysis   heparin  5,000 Units Subcutaneous Q8H   insulin aspart  0-5 Units Subcutaneous QHS   insulin aspart  0-6 Units Subcutaneous TID WC   insulin detemir  5 Units Subcutaneous Daily   metoCLOPramide  5 mg Oral TID AC   sodium chloride flush  3 mL Intravenous Q12H    Dialysis Orders: [facility] Dr LOlivia Mackie3.5hr, EDW 58kg, RIJ TDC, Hep 2000 + 500u/hr   Assessment/Plan: Nausea/vomiting/?gastroparesis: Improved today, diabetes management per primary team T1DM with hypoglycemic seizure in ED: Neuro following. MRI without acute CVA. Still some hypoglycemia overnight but she is feeling  well Somnolence: Significantly improved Sinus tachycardia: Echo 2/8 pending. Workup ongoing - restarted on prior BB (which had been held last admit) ESRD: Recently changed from PD to HD on  09/14/22. Continue HD on TTS schedule  HTN/volume: BP improving. Does not appear overloaded. Several BP meds stopped last admit (Coreg, lisinopril, hydralazine) -> coreg restarted, will follow.  Anemia of ESRD: Hgb 13.2 - no ESA needed for now. Secondary HPTH: Ca ok, Phos high - appears her binder is Phoslo -> restart 2/meals. Severe NICM (EF 13-20%): Repeat echo pending. Hx chronic appearing R superior cerebellar infarct per CT/MRI  Anice Paganini, PA-C 10/06/2022, 9:53 AM  [facility] Pager: [phone]

## 2022-10-06 NOTE — Hospital Course (Signed)
Kathy Lewis was admitted to the hospital with the working diagnosis of nausea and vomiting, complicated with uncontrolled hypertension.   27 yo female with the past medical history of ERSD on HD, heart failure, hypertension and T1DM who presented with nausea and vomiting. Reported persistent nausea and vomiting, prompting her to come to the ED for 2 consecutive days. On her initial physical examination her blood pressure was 156/133, HR 123, RR 19 and 02 saturation 94%, lungs with no wheezing or rales, heart with S1 and S2 present and rhythmic, abdomen non tender and not distended and no lower extremity edema.   Na 136, K 4,4 CL 101 bicarbonate 21, glucose 169, bun 39 cr 7,0 Sars covid 19 negative   Head CT with small chronic appearing right superior cerebellar infarct. No acute changes.  Patient developed hypoglycemia and persistent uncontrolled hypertension.   EKG 120 bpm, left axis deviation, qtc 515, sinus rhythm with no significant ST segment or T wave changes.   Admitted to the ICU for clevidipine infusion.   02/08 weaned off clevidipne with good toleration. HD with ultrafiltration.  02/09 transferred to [facility].  02/10 blood pressure and glucose improving, tolerating well HD.  02/11 patient back on oral antihypertensive regimen with improvement in blood pressure. Plan to follow up as outpatient.

## 2022-10-06 NOTE — Assessment & Plan Note (Signed)
Continue with amitriptyline and venlafaxine.  

## 2022-10-06 NOTE — Assessment & Plan Note (Signed)
Hypertensive emergency.  Continue to have elevated systolic blood pressure. Will increase amlodipine to 10 mg daily and will start patient on Bidil Continue with carvedilol.

## 2022-10-06 NOTE — Progress Notes (Signed)
  Progress Note   Patient: Kathy Lewis [id number] DOB: September 24, 1995 DOA: 10/04/2022     2 DOS: the patient was seen and examined on 10/06/2022   Brief hospital course: Kathy Lewis was admitted to the hospital with the working diagnosis of nausea and vomiting, complicated with uncontrolled hypertension.   27 yo female with the past medical history of ERSD on HD, heart failure and T1DM who presented with nausea and vomiting. Reported persistent nausea and vomiting, prompting her to come to the ED for 2 consecutive days. On her initial physical examination her blood pressure was 156/133, HR 123, RR 19 and 02 saturation 94%, lungs with no wheezing or rales, heart with S1 and S2 present and rhythmic, abdomen non tender and not distended and no lower extremity edema.   Patient developed hypoglycemia and persistent uncontrolled hypertension.   Admitted to the ICU for clevidipine infusion.   02/08 weaned off clevidipne with good toleration. HD with ultrafiltration.  02/09 transferred to [facility].   Assessment and Plan: * Intractable nausea and vomiting Patient continue with nausea and pain with swallowing but not vomiting.   Will add pantoprazole bid If persistent symptoms may need endoscopic evaluation for possible esophagitis.  Considering diabetes mellitus she is in risk for candida esophagitis.   Essential hypertension Hypertensive emergency.  Blood pressure better controlled with carvedilol.  Will add amlodipine.  Continue blood pressure monitoring.   ESRD on hemodialysis [facility]) Continue renal replacement therapy per nephrology recommendations.   Uncontrolled type 1 diabetes mellitus with hyperglycemia, with long-term current use of insulin (HCC) Hypoglycemia.   Continue insulin therapy, will reduce dose of basal insulin and sliding scale.  Last capillary glucose 82.   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 EEG negative for seizures.  Acute CVA has been  ruled out.  Symptoms likely due to hypoglycemia.   Chronic systolic CHF (congestive heart failure) (HCC) Reduced LV systolic function.  No signs of acute decompensation.  Continue ultrafiltration through HD.   Dyslipidemia Continue statin therapy   Bipolar 2 disorder ([place]) Continue with amitriptyline         Subjective: Patient is feeling better, continue to have nausea and pain with swallowing, no vomiting, no dyspnea or chest pain   Physical Exam: Vitals:   10/05/22 2327 10/06/22 0340 10/06/22 0839 10/06/22 1213  BP: (!) 120/93 (!) 115/97 (!) 110/94 (!) 123/105  Pulse: 95 98 (!) 101 98  Resp: 16 16  17   Temp: 98.8 F (37.1 C) 98.6 F (37 C)  97.7 F (36.5 C)  TempSrc: Oral Oral  Oral  SpO2: 96% 98% 95% 98%  Weight:      Height:       Neurology awake and alert ENT with mild pallor Cardiovascular with S1 and S2 present and rhythmic with no gallops, rubs or murmurs Respiratory with no rales or wheezing Abdomen with no distention  No lower extremity edema  Data Reviewed:    Family Communication: I spoke with patient's mother at the bedside, we talked in detail about patient's condition, plan of care and prognosis and all questions were addressed.   Disposition: Status is: Inpatient Remains inpatient appropriate because: blood pressure monitoring and renal replacement therapy   Planned Discharge Destination: Home   Author: Tawni Millers, MD 10/06/2022 3:53 PM  For on call review [email or web address].

## 2022-10-06 NOTE — Inpatient Diabetes Management (Signed)
Inpatient Diabetes Program Recommendations  AACE/ADA: New Consensus Statement on Inpatient Glycemic Control (2015)  Target Ranges:  Prepandial:   less than 140 mg/dL      Peak postprandial:   less than 180 mg/dL (1-2 hours)      Critically ill patients:  140 - 180 mg/dL   Lab Results  Component Value Date   GLUCAP 201 (H) 10/06/2022   HGBA1C 8.6 (H) 06/27/2022    Review of Glycemic Control  Latest Reference Range & Units 10/05/22 07:40 10/05/22 11:57 10/05/22 15:58 10/05/22 16:42 10/05/22 21:33 10/05/22 21:57 10/05/22 22:15 10/06/22 06:02  Glucose-Capillary 70 - 99 mg/dL 231 (H)  Novolog 2 units  Levemir 5 units 117 (H) 61 (L) 82 53 (L) 50 (L) 177 (H) 201 (H)  Novolog 2 units  Levemir 5 units   Diabetes history: DM type 1 Outpatient Diabetes medications: OmniPod insulin pump with Dexcom CGM; insulin pump settings (0.5 units/hour (total basal 12 units per day), 1 unit per 8 grams carbs, 1 unit drops glucose 50 mg/dl) Current orders for Inpatient glycemic control:  Levemir 5 units Daily Novolog 0-6 units TID with meals  Inpatient Diabetes Program Recommendations:    Hypoglycemia on current Novolog correction scale  -  Reduce Levemir to 3 units  -  Reduce Novolog Correction scale to a custom scale starting at 201 mg/dl  70 - 200  0 units Novolog 201- 300  1 unit Novolog  301- 400  2 units Novolog > 400 call MD  Anticipate glucose trends to drop again today as pt received Levemir 5 units already and Novolog 2 units for glucose of 201.  Thanks,  Tama Headings RN, MSN, BC-ADM Inpatient Diabetes Coordinator Team Pager [phone] (8a-5p)

## 2022-10-06 NOTE — Assessment & Plan Note (Signed)
Reduced LV systolic function.  No signs of acute decompensation.  Continue ultrafiltration through HD.

## 2022-10-06 NOTE — Assessment & Plan Note (Signed)
Hypoglycemia.   Random glucose today 166. Fasting capillary glucose 132. Continue low dose basal insulin and low dose insulin sliding scale.  Her po intake is improving.

## 2022-10-06 NOTE — Assessment & Plan Note (Signed)
Continue statin therapy.

## 2022-10-06 NOTE — Assessment & Plan Note (Signed)
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. EEG negative for seizures.  Acute CVA has been ruled out.  Symptoms likely due to hypoglycemia.

## 2022-10-06 NOTE — Assessment & Plan Note (Signed)
Patient was placed on antiacids and as needed antiemetics with good toleration.   Old records personally reviewed, EGD from 03/2022 with no esophagitis.   Continue pantoprazole daily.  Small portions when eating.  Possible diabetic gastroparesis.

## 2022-10-06 NOTE — Assessment & Plan Note (Signed)
Patient underwent inpatient renal replacement therapy with HD with good toleration.  Patient has been recently changes from PD to HD on 09/14/22.  Her regular schedule is Tuesday, Thursday and Saturday.   Anemia of chronic renal disease, cell count has been stable, follow up as outpatient.  Metabolic bone disease, continue with phosphate binders.

## 2022-10-07 DIAGNOSIS — N186 End stage renal disease: Secondary | ICD-10-CM | POA: Diagnosis not present

## 2022-10-07 DIAGNOSIS — E1065 Type 1 diabetes mellitus with hyperglycemia: Secondary | ICD-10-CM | POA: Diagnosis not present

## 2022-10-07 DIAGNOSIS — I10 Essential (primary) hypertension: Secondary | ICD-10-CM | POA: Diagnosis not present

## 2022-10-07 DIAGNOSIS — R112 Nausea with vomiting, unspecified: Secondary | ICD-10-CM | POA: Diagnosis not present

## 2022-10-07 LAB — RENAL FUNCTION PANEL
Albumin: 2.7 g/dL — ABNORMAL LOW (ref 3.5–5.0)
Anion gap: 17 — ABNORMAL HIGH (ref 5–15)
BUN: 49 mg/dL — ABNORMAL HIGH (ref 6–20)
CO2: 22 mmol/L (ref 22–32)
Calcium: 7.8 mg/dL — ABNORMAL LOW (ref 8.9–10.3)
Chloride: 95 mmol/L — ABNORMAL LOW (ref 98–111)
Creatinine, Ser: 8.22 mg/dL — ABNORMAL HIGH (ref 0.44–1.00)
GFR, Estimated: 6 mL/min — ABNORMAL LOW (ref >60)
Glucose, Bld: 166 mg/dL — ABNORMAL HIGH (ref 70–99)
Phosphorus: 7.8 mg/dL — ABNORMAL HIGH (ref 2.5–4.6)
Potassium: 5.1 mmol/L (ref 3.5–5.1)
Sodium: 134 mmol/L — ABNORMAL LOW (ref 135–145)

## 2022-10-07 LAB — CBC
HCT: 44.6 % (ref 36.0–46.0)
Hemoglobin: 13.9 g/dL (ref 12.0–15.0)
MCH: 31.7 pg (ref 26.0–34.0)
MCHC: 31.2 g/dL (ref 30.0–36.0)
MCV: 101.6 fL — ABNORMAL HIGH (ref 80.0–100.0)
Platelets: 162 10*3/uL (ref 150–400)
RBC: 4.39 MIL/uL (ref 3.87–5.11)
RDW: 17.2 % — ABNORMAL HIGH (ref 11.5–15.5)
WBC: 4.8 10*3/uL (ref 4.0–10.5)
nRBC: 1 % — ABNORMAL HIGH (ref 0.0–0.2)

## 2022-10-07 LAB — GLUCOSE, CAPILLARY
Glucose-Capillary: 132 mg/dL — ABNORMAL HIGH (ref 70–99)
Glucose-Capillary: 189 mg/dL — ABNORMAL HIGH (ref 70–99)
Glucose-Capillary: 196 mg/dL — ABNORMAL HIGH (ref 70–99)
Glucose-Capillary: 124 mg/dL — ABNORMAL HIGH (ref 70–99)

## 2022-10-07 MED ORDER — PANTOPRAZOLE SODIUM 40 MG PO TBEC
40.0000 mg | DELAYED_RELEASE_TABLET | Freq: Two times a day (BID) | ORAL | Status: DC
  Administered 2022-10-07 – 2022-10-08 (×2): 40 mg via ORAL
  Filled 2022-10-07 (×2): qty 1

## 2022-10-07 MED ORDER — AMLODIPINE BESYLATE 10 MG PO TABS
10.0000 mg | ORAL_TABLET | Freq: Every day | ORAL | Status: DC
  Administered 2022-10-08: 10 mg via ORAL
  Filled 2022-10-07: qty 1

## 2022-10-07 MED ORDER — HEPARIN SODIUM (PORCINE) 1000 UNIT/ML DIALYSIS: 2000.0000 [IU] | Freq: Once | INTRAMUSCULAR | Status: AC

## 2022-10-07 MED ORDER — ISOSORB DINITRATE-HYDRALAZINE 20-37.5 MG PO TABS
1.0000 | ORAL_TABLET | Freq: Three times a day (TID) | ORAL | Status: DC
  Administered 2022-10-07 – 2022-10-08 (×3): 1 via ORAL
  Filled 2022-10-07 (×3): qty 1

## 2022-10-07 MED ORDER — AMLODIPINE BESYLATE 5 MG PO TABS
5.0000 mg | ORAL_TABLET | Freq: Once | ORAL | Status: AC
  Administered 2022-10-07: 5 mg via ORAL
  Filled 2022-10-07: qty 1

## 2022-10-07 MED ORDER — AMLODIPINE BESYLATE 5 MG PO TABS: 5.0000 mg | ORAL_TABLET | Freq: Every day | ORAL | Status: DC

## 2022-10-07 NOTE — Progress Notes (Signed)
Progress Note   Patient: Kathy Lewis [id number] DOB: Dec 16, 1995 DOA: 10/04/2022     3 DOS: the patient was seen and examined on 10/07/2022   Brief hospital course: Kathy Lewis was admitted to the hospital with the working diagnosis of nausea and vomiting, complicated with uncontrolled hypertension.   27 yo female with the past medical history of ERSD on HD, heart failure and T1DM who presented with nausea and vomiting. Reported persistent nausea and vomiting, prompting her to come to the ED for 2 consecutive days. On her initial physical examination her blood pressure was 156/133, HR 123, RR 19 and 02 saturation 94%, lungs with no wheezing or rales, heart with S1 and S2 present and rhythmic, abdomen non tender and not distended and no lower extremity edema.   Patient developed hypoglycemia and persistent uncontrolled hypertension.   Admitted to the ICU for clevidipine infusion.   02/08 weaned off clevidipne with good toleration. HD with ultrafiltration.  02/09 transferred to [facility].  02/10 blood pressure and glucose improving, tolerating well HD.   Assessment and Plan: * Intractable nausea and vomiting Patient continue with nausea and pain with swallowing but not vomiting.   Continue with po pantoprazole bid Old records personally reviewed, EGD from 03/2022 with no esophagitis.   Continue with as needed antiemetics.  Small portions when eating.   Essential hypertension Hypertensive emergency.  Continue to have elevated systolic blood pressure. Will increase amlodipine to 10 mg daily and will start patient on Bidil Continue with carvedilol.    ESRD on hemodialysis [facility]) Patient had HD today with good toleration.   Uncontrolled type 1 diabetes mellitus with hyperglycemia, with long-term current use of insulin (HCC) Hypoglycemia.   Random glucose today 166. Fasting capillary glucose 132. Continue low dose basal insulin and low dose insulin sliding scale.  Her po intake is  improving.   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 EEG negative for seizures.  Acute CVA has been ruled out.  Symptoms likely due to hypoglycemia.   Chronic systolic CHF (congestive heart failure) (HCC) Echocardiogram with reduced LV systolic function, [id number], global hypokinesis, RV with moderate reduction in systolic function, small pericardial effusion, moderate TR. RVSP 56,2   Pulmonary hypertension Acute on chronic core pulmonale.   Continue fluid management per ultrafiltration on HD After load reduction with Bidil B blockade with carvedilol Continue blood pressure control with amlodipine.   Dyslipidemia Continue statin therapy   Bipolar 2 disorder ([place]) Continue with amitriptyline         Subjective: Patient is feeling better, with improved nausea, continue to have pain with swallowing but improved.  Physical Exam: Vitals:   10/07/22 1030 10/07/22 1100 10/07/22 1130 10/07/22 1141  BP: (!) 137/107 (!) 143/114 (!) 135/107 (!) 139/104  Pulse: (!) 107 (!) 106 (!) 105 (!) 108  Resp: (!) 29 14 16 19  $ Temp:    98.1 F (36.7 C)  TempSrc:    Oral  SpO2:   95% 96%  Weight:    57 kg  Height:       Neurology awake and alert ENT with mild pallor Cardiovascular with S1 and S2 present and rhythmic with no gallops, rubs or murmurs Respiratory with no rales or wheezing Abdomen with no distention  No lower extremity edema  Data Reviewed:    Family Communication: I spoke with patient's mother at the bedside, we talked in detail about patient's condition, plan of care and prognosis and all questions were addressed.   Disposition: Status is:  Inpatient Remains inpatient appropriate because: blood pressure control, possible dc home tomorrow   Planned Discharge Destination: Home      Author: Tawni Millers, MD 10/07/2022 1:34 PM  For on call review [email or web address].

## 2022-10-07 NOTE — Progress Notes (Addendum)
[facility] Progress Note   Subjective:   Seen on HD. No concerns, feeling well. Denies SOB, CP, dizziness, palpitations and nausea.   Objective Vitals:   10/07/22 0356 10/07/22 0534 10/07/22 0752 10/07/22 0813  BP: (!) 115/99  (!) 129/100 (!) 130/102  Pulse: 92  91   Resp: 20  18 19  $ Temp: 98.5 F (36.9 C)  98.2 F (36.8 C) 98 F (36.7 C)  TempSrc: Oral  Oral Oral  SpO2: 99%  100% 97%  Weight:  60.3 kg  58.8 kg  Height:       Physical Exam General: Alert female in NAD Heart: RRR, no murmurs, rubs or gallops Lungs: CTA bilaterally, respirations unlabored Abdomen: Nondistended, +BS Extremities: No edema b/l lower extremities Dialysis Access:  [facility] accessed  Additional Objective Labs: Basic Metabolic Panel: Recent Labs  Lab 10/03/22 1142 10/04/22 0322 10/04/22 0326 10/05/22 0332  NA 134* 136 136 139  K 3.6 4.4 4.4 5.3*  CL 101 101  --  102  CO2 24 21*  --  15*  GLUCOSE 155* 169*  --  180*  BUN 34* 39*  --  53*  CREATININE 5.83* 7.04*  --  8.38*  CALCIUM 7.7* 8.5*  --  8.8*  PHOS  --   --   --  8.2*   Liver Function Tests: Recent Labs  Lab 10/05/22 0332  ALBUMIN 3.2*   No results for input(s): "LIPASE", "AMYLASE" in the last 168 hours. CBC: Recent Labs  Lab 10/03/22 1142 10/04/22 0303 10/04/22 0326 10/05/22 0332 10/07/22 0735  WBC 5.0 4.2  --  5.8 4.8  NEUTROABS  --  3.0  --   --   --   HGB 13.8 12.5 14.3 13.2 13.9  HCT 43.9 40.7 42.0 41.9 44.6  MCV 100.0 102.0*  --  101.5* 101.6*  PLT 155 172  --  202 162   Blood Culture    Component Value Date/Time   SDES  03/15/2022 1854    URINE, CLEAN CATCH Performed at [facility], [address] [place] Bhat [place], [place] [address]    [facility]  03/15/2022 1854    NONE Performed at [facility], [address] [place], [place] [address]    CULT MULTIPLE SPECIES PRESENT, SUGGEST RECOLLECTION (A) 03/15/2022 1854   REPTSTATUS 03/16/2022 FINAL 03/15/2022 1854     Cardiac Enzymes: No results for input(s): "CKTOTAL", "CKMB", "CKMBINDEX", "TROPONINI" in the last 168 hours. CBG: Recent Labs  Lab 10/06/22 1811 10/06/22 2033 10/06/22 2103 10/06/22 2344 10/07/22 0608  GLUCAP 73 67* 92 131* 132*   Iron Studies: No results for input(s): "IRON", "TIBC", "TRANSFERRIN", "FERRITIN" in the last 72 hours. @lablastinr3$ @ Studies/Results: ECHOCARDIOGRAM COMPLETE  Result Date: 10/05/2022    ECHOCARDIOGRAM REPORT   Patient Name:   Kathy Lewis Date of Exam: 10/05/2022 Medical Rec #:  [id number]     Height:       60.0 in Accession #:    [id number]    Weight:       132.7 lb Date of Birth:  [birth year]     BSA:          1.568 m Patient Age:    27 years      BP:           122/89 mmHg Patient Gender: F             HR:           118 bpm. Exam Location:  Inpatient Procedure: 2D Echo, 3D Echo, Cardiac Doppler and Color Doppler Indications:    ; R01.1 Murmur  History:        Patient has prior history of Echocardiogram examinations, most                 recent 11/23/2021. CHF, Signs/Symptoms:Altered Mental Status;                 Risk Factors:Diabetes, Dyslipidemia and Hypertension. ESRD.                 Pulmonary embolus.  Sonographer:    Roseanna Rainbow RDCS Referring Phys: Karmen Bongo IMPRESSIONS  1. Left ventricular ejection fraction, by estimation, is <20%. The left ventricle has severely decreased function. The left ventricle demonstrates global hypokinesis. The left ventricular internal cavity size was mildly dilated. Left ventricular diastolic parameters are consistent with Grade III diastolic dysfunction (restrictive). Elevated left atrial pressure.  2. Right ventricular systolic function is moderately reduced. The right ventricular size is normal. There is moderately elevated pulmonary artery systolic pressure.  3. Left atrial size was mildly dilated.  4. Right atrial size was mildly dilated.  5. A small pericardial effusion is present.  6. The mitral valve is normal in  structure. Mild mitral valve regurgitation. No evidence of mitral stenosis.  7. Tricuspid valve regurgitation is moderate.  8. The aortic valve is tricuspid. Aortic valve regurgitation is not visualized. No aortic stenosis is present.  9. The inferior vena cava is dilated in size with <50% respiratory variability, suggesting right atrial pressure of 15 mmHg. Comparison(s): No significant change from prior study. FINDINGS  Left Ventricle: Left ventricular ejection fraction, by estimation, is <20%. The left ventricle has severely decreased function. The left ventricle demonstrates global hypokinesis. The left ventricular internal cavity size was mildly dilated. There is no  left ventricular hypertrophy. Left ventricular diastolic parameters are consistent with Grade III diastolic dysfunction (restrictive). Elevated left atrial pressure. Right Ventricle: The right ventricular size is normal. Right ventricular systolic function is moderately reduced. There is moderately elevated pulmonary artery systolic pressure. The tricuspid regurgitant velocity is 3.21 m/s, and with an assumed right atrial pressure of 15 mmHg, the estimated right ventricular systolic pressure is 99991111 mmHg. Left Atrium: Left atrial size was mildly dilated. Right Atrium: Right atrial size was mildly dilated. Pericardium: A small pericardial effusion is present. Mitral Valve: The mitral valve is normal in structure. Mild mitral valve regurgitation. No evidence of mitral valve stenosis. Tricuspid Valve: The tricuspid valve is normal in structure. Tricuspid valve regurgitation is moderate . No evidence of tricuspid stenosis. Aortic Valve: The aortic valve is tricuspid. Aortic valve regurgitation is not visualized. No aortic stenosis is present. Pulmonic Valve: The pulmonic valve was normal in structure. Pulmonic valve regurgitation is mild to moderate. No evidence of pulmonic stenosis. Aorta: The aortic root is normal in size and structure. Venous: The  inferior vena cava is dilated in size with less than 50% respiratory variability, suggesting right atrial pressure of 15 mmHg. IAS/Shunts: No atrial level shunt detected by color flow Doppler. Additional Comments: A venous catheter is visualized.  LEFT VENTRICLE PLAX 2D LVIDd:         5.20 cm      Diastology LVIDs:         4.90 cm      LV e' medial:    5.52 cm/s LV PW:         1.50 cm      LV E/e' medial:  19.9 LV IVS:        0.80 cm      LV e' lateral:   12.50 cm/s LVOT diam:     1.80 cm      LV E/e' lateral: 8.8 LV SV:         22 LV SV Index:   14 LVOT Area:     2.54 cm                              3D Volume EF: LV Volumes (MOD)            3D EF:        20 % LV vol d, MOD A2C: 161.0 ml LV EDV:       155 ml LV vol d, MOD A4C: 142.5 ml LV ESV:       125 ml LV vol s, MOD A2C: 135.0 ml LV SV:        30 ml LV vol s, MOD A4C: 122.5 ml LV SV MOD A2C:     26.0 ml LV SV MOD A4C:     142.5 ml LV SV MOD BP:      23.3 ml RIGHT VENTRICLE            IVC RV S prime:     8.70 cm/s  IVC diam: 2.60 cm TAPSE (M-mode): 1.0 cm LEFT ATRIUM             Index        RIGHT ATRIUM           Index LA diam:        4.20 cm 2.68 cm/m   RA Area:     20.40 cm LA Vol (A2C):   58.9 ml 37.56 ml/m  RA Volume:   58.00 ml  36.99 ml/m LA Vol (A4C):   59.0 ml 37.63 ml/m LA Biplane Vol: 59.4 ml 37.88 ml/m  AORTIC VALVE             PULMONIC VALVE LVOT Vmax:   69.40 cm/s  PR End Diast Vel: 2.76 msec LVOT Vmean:  46.000 cm/s LVOT VTI:    0.087 m  AORTA Ao Root diam: 2.80 cm MITRAL VALVE                TRICUSPID VALVE MV Area (PHT): 5.68 cm     TR Peak grad:   41.2 mmHg MV Decel Time: 134 msec     TR Vmax:        321.00 cm/s MV E velocity: 110.00 cm/s                             SHUNTS                             Systemic VTI:  0.09 m                             Systemic Diam: 1.80 cm Kirk Ruths MD Electronically signed by Kirk Ruths MD Signature Date/Time: 10/05/2022/12:01:16 PM    Final    Medications:  clevidipine Stopped (10/05/22 0936)     amitriptyline  50 mg Oral QHS   amLODipine  5 mg Oral Daily   atorvastatin  40 mg Oral QPM   calcium acetate  1,334 mg  Oral TID WC   carvedilol  12.5 mg Oral BID WC   Chlorhexidine Gluconate Cloth  6 each Topical Q0600   Chlorhexidine Gluconate Cloth  6 each Topical Q0600   heparin  2,000 Units Dialysis Once in dialysis   heparin  5,000 Units Subcutaneous Q8H   insulin aspart  1 Units Subcutaneous TID WC   insulin detemir  3 Units Subcutaneous Daily   metoCLOPramide  5 mg Oral TID AC   sodium chloride flush  3 mL Intravenous Q12H    Dialysis Orders: [facility] Olivia Mackie 3.5hr, EDW 58kg, RIJ TDC, Hep 2000 + 500u/hr   Assessment/Plan: Uncontrolled severe HTN - 150s/ 130s w/ AMS. Several BP meds stopped last admit (Coreg, lisinopril, hydralazine). Better now, sp cleviprex drip and is back on po medications w/ coreg restarted, plus norvasc and bidil added back today. Agree.   Nausea/vomiting/ poss gastroparesis: Improved, diabetes management per primary team T1DM with hypoglycemic seizure in ED - neuro following. MRI without acute CVA. Hx chronic appearing R superior cerebellar infarct per CT/MRI Somnolence- significantly improved ESRD: Recently changed from PD to HD on 09/14/22. Continue HD on TTS schedule  Anemia of ESRD: Hgb 13.9- no ESA needed for now. Secondary HPTH: Ca ok, Phos high - appears her binder is Phoslo -> restart 2/meals. Severe NICM (EF 13-20%): Repeat echo pending.  Anice Paganini, PA-C 10/07/2022, 8:28 AM  [facility] Pager: [phone]  Pt seen, examined and agree w assess/plan as above with additions as indicated.  [facility] 10/07/2022, 1:26 PM

## 2022-10-07 NOTE — Progress Notes (Signed)
Received patient in bed to unit.  Alert and oriented.  Informed consent signed and in chart.   TX duration: 3.25hrs  Patient tolerated well.  Transported back to the room  Alert, without acute distress.  Hand-off given to patient's nurse.   Access used: R IJ Access issues: None  Total UF removed: 1817m Medication(s) given: None  Post HD weight: 57kg   10/07/22 1141  Vitals  Temp 98.1 F (36.7 C)  Temp Source Oral  BP (!) 139/104  MAP (mmHg) 115  Pulse Rate (!) 108  ECG Heart Rate (!) 108  Resp 19  Oxygen Therapy  SpO2 96 %  O2 Device Room Air  During Treatment Monitoring  Intra-Hemodialysis Comments Tolerated well;Tx completed  Post Treatment  Dialyzer Clearance Lightly streaked  Duration of HD Treatment -hour(s) 3.25 hour(s)  Liters Processed 78.1  Fluid Removed (mL) 1800 mL  Tolerated HD Treatment Yes  Hemodialysis Catheter Right Internal jugular Double lumen Permanent (Tunneled)  Placement Date/Time: 09/14/22 1520   Placed prior to admission: No  Serial / Lot #: [id number] Expiration Date: 05/31/27  Time Out: Correct patient;Correct site;Correct procedure  Maximum sterile barrier precautions: Hand hygiene;Cap;Mask;Sterile gow...  Site Condition No complications  Blue Lumen Status Dead end cap in place;Heparin locked  Red Lumen Status Dead end cap in place;Heparin locked  Purple Lumen Status N/A  Catheter fill solution Heparin 1000 units/ml  Catheter fill volume (Arterial) 1.6 cc  Catheter fill volume (Venous) 1.6  Dressing Type Transparent  Dressing Status Antimicrobial disc in place;Clean, Dry, Intact  Interventions New dressing  Drainage Description None  Dressing Change Due 10/14/22  Post treatment catheter status Capped and Clamped     [facility]

## 2022-10-08 DIAGNOSIS — R112 Nausea with vomiting, unspecified: Secondary | ICD-10-CM | POA: Diagnosis not present

## 2022-10-08 DIAGNOSIS — N186 End stage renal disease: Secondary | ICD-10-CM | POA: Diagnosis not present

## 2022-10-08 DIAGNOSIS — I10 Essential (primary) hypertension: Secondary | ICD-10-CM | POA: Diagnosis not present

## 2022-10-08 DIAGNOSIS — E1065 Type 1 diabetes mellitus with hyperglycemia: Secondary | ICD-10-CM | POA: Diagnosis not present

## 2022-10-08 LAB — GLUCOSE, CAPILLARY: Glucose-Capillary: 119 mg/dL — ABNORMAL HIGH (ref 70–99)

## 2022-10-08 MED ORDER — ISOSORB DINITRATE-HYDRALAZINE 20-37.5 MG PO TABS: 1.0000 | ORAL_TABLET | Freq: Three times a day (TID) | ORAL | 0 refills | Status: DC

## 2022-10-08 MED ORDER — ISOSORBIDE MONONITRATE ER 30 MG PO TB24: 30.0000 mg | ORAL_TABLET | Freq: Every day | ORAL | Status: DC

## 2022-10-08 MED ORDER — HYDRALAZINE HCL 50 MG PO TABS: 50.0000 mg | ORAL_TABLET | Freq: Two times a day (BID) | ORAL | 0 refills | Status: DC

## 2022-10-08 MED ORDER — CARVEDILOL 12.5 MG PO TABS: 12.5000 mg | ORAL_TABLET | Freq: Two times a day (BID) | ORAL | 0 refills | Status: DC

## 2022-10-08 MED ORDER — PANTOPRAZOLE SODIUM 40 MG PO TBEC: 40.0000 mg | DELAYED_RELEASE_TABLET | Freq: Every day | ORAL | 0 refills | Status: DC

## 2022-10-08 MED ORDER — NEPRO/CARBSTEADY PO LIQD: 237.0000 mL | Freq: Three times a day (TID) | ORAL | 0 refills | Status: AC | PRN

## 2022-10-08 MED ORDER — HYDRALAZINE HCL 50 MG PO TABS: 50.0000 mg | ORAL_TABLET | Freq: Two times a day (BID) | ORAL | Status: DC

## 2022-10-08 MED ORDER — ISOSORBIDE MONONITRATE ER 30 MG PO TB24: 30.0000 mg | ORAL_TABLET | Freq: Every day | ORAL | 0 refills | Status: DC

## 2022-10-08 MED ORDER — HYDRALAZINE HCL 50 MG PO TABS: 25.0000 mg | ORAL_TABLET | Freq: Two times a day (BID) | ORAL | 0 refills | Status: DC

## 2022-10-08 MED ORDER — AMLODIPINE BESYLATE 10 MG PO TABS: 10.0000 mg | ORAL_TABLET | Freq: Every day | ORAL | 0 refills | Status: DC

## 2022-10-08 NOTE — Progress Notes (Signed)
[facility] Progress Note   Subjective:   Still has not been able to eat much due to nausea, says she can only tolerate 2-3 bites at a time. Denies SOB, CP, dizziness.   Objective Vitals:   10/07/22 1655 10/07/22 1939 10/07/22 2325 10/08/22 0327  BP: (!) 96/58 112/78 104/67 112/71  Pulse: (!) 109 95 92 93  Resp: 17 16 17 17  $ Temp: 99.2 F (37.3 C) 99 F (37.2 C) 98.1 F (36.7 C) 98.5 F (36.9 C)  TempSrc: Oral Oral Oral Oral  SpO2: 95% 97% 95% 94%  Weight:      Height:       Physical Exam General: Alert female in NAD Heart: RRR, no murmurs, rubs or gallops Lungs: CTA bilaterally, respirations unlabored Abdomen: Nondistended, +BS Extremities: No edema b/l lower extremities Dialysis Access:  [facility] with intact bandage   Additional Objective Labs: Basic Metabolic Panel: Recent Labs  Lab 10/04/22 0322 10/04/22 0326 10/05/22 0332 10/07/22 0823  NA 136 136 139 134*  K 4.4 4.4 5.3* 5.1  CL 101  --  102 95*  CO2 21*  --  15* 22  GLUCOSE 169*  --  180* 166*  BUN 39*  --  53* 49*  CREATININE 7.04*  --  8.38* 8.22*  CALCIUM 8.5*  --  8.8* 7.8*  PHOS  --   --  8.2* 7.8*   Liver Function Tests: Recent Labs  Lab 10/05/22 0332 10/07/22 0823  ALBUMIN 3.2* 2.7*   No results for input(s): "LIPASE", "AMYLASE" in the last 168 hours. CBC: Recent Labs  Lab 10/03/22 1142 10/04/22 0303 10/04/22 0326 10/05/22 0332 10/07/22 0735  WBC 5.0 4.2  --  5.8 4.8  NEUTROABS  --  3.0  --   --   --   HGB 13.8 12.5 14.3 13.2 13.9  HCT 43.9 40.7 42.0 41.9 44.6  MCV 100.0 102.0*  --  101.5* 101.6*  PLT 155 172  --  202 162   Blood Culture    Component Value Date/Time   SDES  03/15/2022 1854    URINE, CLEAN CATCH Performed at [facility], [address]aine Bhat [place], [place] [address]    [facility]  03/15/2022 1854    NONE Performed at [facility], [address] [place], [place] [address]    CULT MULTIPLE SPECIES PRESENT, SUGGEST  RECOLLECTION (A) 03/15/2022 1854   REPTSTATUS 03/16/2022 FINAL 03/15/2022 1854    Cardiac Enzymes: No results for input(s): "CKTOTAL", "CKMB", "CKMBINDEX", "TROPONINI" in the last 168 hours. CBG: Recent Labs  Lab 10/07/22 0608 10/07/22 1430 10/07/22 1656 10/07/22 2051 10/08/22 0618  GLUCAP 132* 189* 196* 124* 119*   Iron Studies: No results for input(s): "IRON", "TIBC", "TRANSFERRIN", "FERRITIN" in the last 72 hours. @lablastinr3$ @ Studies/Results: No results found. Medications:   amitriptyline  50 mg Oral QHS   amLODipine  10 mg Oral Daily   atorvastatin  40 mg Oral QPM   calcium acetate  1,334 mg Oral TID WC   carvedilol  12.5 mg Oral BID WC   Chlorhexidine Gluconate Cloth  6 each Topical Q0600   Chlorhexidine Gluconate Cloth  6 each Topical Q0600   heparin  5,000 Units Subcutaneous Q8H   insulin aspart  1 Units Subcutaneous TID WC   insulin detemir  3 Units Subcutaneous Daily   isosorbide-hydrALAZINE  1 tablet Oral TID   metoCLOPramide  5 mg Oral TID AC   pantoprazole  40 mg Oral BID AC  sodium chloride flush  3 mL Intravenous Q12H    Dialysis Orders: [facility] Mackie 3.5hr, EDW 58kg, RIJ TDC, Hep 2000 + 500u/hr   Assessment/Plan: Uncontrolled severe HTN - 150s/ 130s w/ AMS. Several BP meds stopped last admit (Coreg, lisinopril, hydralazine). Better now, sp cleviprex drip and is back on po medications w/ coreg restarted, plus norvasc and bidil added back   Nausea/vomiting/ poss gastroparesis: symptomatic/diabetes management per primary team T1DM with hypoglycemic seizure in ED - neuro following. MRI without acute CVA. Hx chronic appearing R superior cerebellar infarct per CT/MRI Somnolence- significantly improved ESRD: Recently changed from PD to HD on 09/14/22. Continue HD on TTS schedule  Anemia of ESRD: Hgb 13.9- no ESA needed for now. Secondary HPTH: Ca ok, Phos high. Phoslo resumed Severe NICM (EF 13-20%): appears euvolemic    Anice Paganini,  PA-C 10/08/2022, 7:47 AM  [facility] Pager: [phone]

## 2022-10-08 NOTE — Discharge Summary (Addendum)
Physician Discharge Summary   Patient: Kathy Lewis MRN: [id number] DOB: [birth year]  Admit date:     10/04/2022  Discharge date: 10/08/22  Discharge Physician: Tawni Millers   PCP: [place].   Recommendations at discharge:    Patient has been placed on antihypertensive regimen including amlodipine, carvedilol and isosorbide/ hydralazine. Added pantoprazole. Follow up with HD Tues-Thursday and Saturday. Follow up with primary care in 7 to 10 days.  Follow up with nephrology as scheduled.   I spoke with patient's mother at the bedside, we talked in detail about patient's condition, plan of care and prognosis and all questions were addressed.   Discharge Diagnoses: Principal Problem:   Intractable nausea and vomiting Active Problems:   Essential hypertension   ESRD on hemodialysis ([place])   Uncontrolled type 1 diabetes mellitus with hyperglycemia, with long-term current use of insulin (HCC)   Seizure-like activity (HCC)   Chronic systolic CHF (congestive heart failure) (HCC)   Dyslipidemia   Bipolar 2 disorder (HCC)  Resolved Problems:   * No resolved hospital problems. [facility] Course: Mrs. Kathy Lewis was admitted to the hospital with the working diagnosis of nausea and vomiting, complicated with uncontrolled hypertension.   27 yo female with the past medical history of ERSD on HD, heart failure, hypertension and T1DM who presented with nausea and vomiting. Reported persistent nausea and vomiting, prompting her to come to the ED for 2 consecutive days. On her initial physical examination her blood pressure was 156/133, HR 123, RR 19 and 02 saturation 94%, lungs with no wheezing or rales, heart with S1 and S2 present and rhythmic, abdomen non tender and not distended and no lower extremity edema.   Na 136, K 4,4 CL 101 bicarbonate 21, glucose 169, bun 39 cr 7,0 Sars covid 19 negative   Head CT with small chronic appearing right superior cerebellar  infarct. No acute changes.  Patient developed hypoglycemia and persistent uncontrolled hypertension.   EKG 120 bpm, left axis deviation, qtc 515, sinus rhythm with no significant ST segment or T wave changes.   Admitted to the ICU for clevidipine infusion.   02/08 weaned off clevidipne with good toleration. HD with ultrafiltration.  02/09 transferred to [facility].  02/10 blood pressure and glucose improving, tolerating well HD.  02/11 patient back on oral antihypertensive regimen with improvement in blood pressure. Plan to follow up as outpatient.   Assessment and Plan: * Intractable nausea and vomiting Patient was placed on antiacids and as needed antiemetics with good toleration.   Old records personally reviewed, EGD from 03/2022 with no esophagitis.   Continue pantoprazole daily.  Small portions when eating.  Possible diabetic gastroparesis.   Essential hypertension Hypertensive emergency.  Blood pressure was initially controlled with clevedipine infusion, posteriorly she was transitioned to oral agents.  Currently on carvedilol, bidil and amlodipine for blood pressure control.    ESRD on hemodialysis [facility]) Patient underwent inpatient renal replacement therapy with HD with good toleration.  Patient has been recently changes from PD to HD on 09/14/22.  Her regular schedule is Tuesday, Thursday and Saturday.   Anemia of chronic renal disease, cell count has been stable, follow up as outpatient.  Metabolic bone disease, continue with phosphate binders.   Uncontrolled type 1 diabetes mellitus with hyperglycemia, with long-term current use of insulin (HCC) Hypoglycemia.   Patient required low dose of basal insulin 3 units and low dose sliding scale.  At the time of her discharge she will resume her insulin  pump.   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 EEG negative for seizures.  Acute CVA has been ruled out.  Symptoms likely due to hypoglycemia.    Chronic systolic CHF (congestive heart failure) (HCC) Echocardiogram with reduced LV systolic function, 27, global hypokinesis, RV with moderate reduction in systolic function, small pericardial effusion, moderate TR. RVSP 56,2   Pulmonary hypertension Acute on chronic core pulmonale.   Continue fluid management per ultrafiltration on HD After load reduction with Bidil B blockade with carvedilol Continue blood pressure control with amlodipine.   Dyslipidemia Continue statin therapy   Bipolar 2 disorder ([place]) Continue with amitriptyline          Consultants: nephrology  Procedures performed: none  Disposition: Home Diet recommendation:  Discharge Diet Orders (From admission, onward)     Start     Ordered   10/08/22 0000  Diet - low sodium heart healthy        10/08/22 1109           Cardiac and Carb modified diet DISCHARGE MEDICATION: Allergies as of 10/08/2022       Reactions   Cantaloupe Extract Allergy Skin Test Itching   Mouth itching   Strawberry Extract Itching   Mouth itching   Citrullus Vulgaris Itching   Mouth itching   Food Itching   All melon - mouth itching   Nsaids Other (See Comments)   Avoid per nephrology        Medication List     STOP taking these medications    LORazepam 1 MG tablet Commonly known as: Ativan       TAKE these medications    acetaminophen 500 MG tablet Commonly known as: TYLENOL Take 1,000 mg by mouth as needed for headache (pain).   amitriptyline 50 MG tablet Commonly known as: ELAVIL Take 1 tablet (50 mg total) by mouth at bedtime.   amLODipine 10 MG tablet Commonly known as: NORVASC Take 1 tablet (10 mg total) by mouth daily. Start taking on: October 09, 2022   atorvastatin 40 MG tablet Commonly known as: LIPITOR Take 40 mg by mouth every evening.   calcium acetate 667 MG capsule Commonly known as: PHOSLO Take 1,334 mg by mouth 3 (three) times daily with meals.   carvedilol 12.5 MG  tablet Commonly known as: COREG Take 1 tablet (12.5 mg total) by mouth 2 (two) times daily with a meal.   clonazePAM 0.5 MG tablet Commonly known as: KLONOPIN Take 1 tablet (0.5 mg total) by mouth 2 (two) times daily as needed for anxiety. What changed: when to take this   feeding supplement (NEPRO CARB STEADY) Liqd Take 237 mLs by mouth 3 (three) times daily as needed (Supplement).   hydrALAZINE 50 MG tablet Commonly known as: APRESOLINE Take 1 tablet (50 mg total) by mouth in the morning and at bedtime.   hydrOXYzine 10 MG tablet Commonly known as: ATARAX Take 1 tablet (10 mg total) by mouth 3 (three) times daily as needed. What changed: when to take this   insulin lispro 100 UNIT/ML injection Commonly known as: HUMALOG Inject 0.5 Units into the skin See admin instructions. 0.5 units per hour via insulin pump.   isosorbide mononitrate 30 MG 24 hr tablet Commonly known as: IMDUR Take 1 tablet (30 mg total) by mouth daily. Start taking on: October 09, 2022   magnesium oxide 400 MG tablet Commonly known as: MAG-OX Take 800 mg by mouth daily.   methocarbamol 500 MG tablet Commonly known  as: ROBAXIN Take 1 tablet (500 mg total) by mouth 2 (two) times daily as needed for up to 15 doses for muscle spasms. What changed: when to take this   metoCLOPramide 10 MG tablet Commonly known as: REGLAN Take 10 mg by mouth as needed for nausea or vomiting.   ondansetron 4 MG tablet Commonly known as: ZOFRAN Take 4 mg by mouth as needed for nausea or vomiting.   pantoprazole 40 MG tablet Commonly known as: PROTONIX Take 1 tablet (40 mg total) by mouth daily.   Vitamin D (Ergocalciferol) 1.25 MG (50000 UNIT) Caps capsule Commonly known as: DRISDOL Take 50,000 Units by mouth every Sunday.        Discharge Exam: Filed Weights   10/07/22 0534 10/07/22 0813 10/07/22 1141  Weight: 60.3 kg 58.8 kg 57 kg   BP 105/78 (BP Location: Left Arm)   Pulse 89   Temp 98.6 F (37 C)  (Oral)   Resp 18   Ht 5' (1.524 m)   Wt 57 kg   SpO2 99%   BMI 24.54 kg/m   Patient is feeling better, no chest pain or dyspnea, tolerating po well with no vomiting.  Neurology awake and alert ENT with mild pallor Cardiovascular with S1 and S2 present and rhythmic, no gallops, rubs or murmurs No JVD No lower extremity edema Respiratory with no rales or wheezing Abdomen with no distention   Condition at discharge: stable  The results of significant diagnostics from this hospitalization (including imaging, microbiology, ancillary and laboratory) are listed below for reference.   Imaging Studies: ECHOCARDIOGRAM COMPLETE  Result Date: 10/05/2022    ECHOCARDIOGRAM REPORT   Patient Name:   Rutha Magos Date of Exam: 10/05/2022 Medical Rec #:  [id number]     Height:       60.0 in Accession #:    [id number]    Weight:       132.7 lb Date of Birth:  01/20/1996     BSA:          1.568 m Patient Age:    26 years      BP:           122/89 mmHg Patient Gender: F             HR:           11$ 8 bpm. Exam Location:  Inpatient Procedure: 2D Echo, 3D Echo, Cardiac Doppler and Color Doppler Indications:    ; R01.1 Murmur  History:        Patient has prior history of Echocardiogram examinations, most                 recent 11/23/2021. CHF, Signs/Symptoms:Altered Mental Status;                 Risk Factors:Diabetes, Dyslipidemia and Hypertension. ESRD.                 Pulmonary embolus.  Sonographer:    Roseanna Rainbow RDCS Referring Phys: Karmen Bongo IMPRESSIONS  1. Left ventricular ejection fraction, by estimation, is <20%. The left ventricle has severely decreased function. The left ventricle demonstrates global hypokinesis. The left ventricular internal cavity size was mildly dilated. Left ventricular diastolic parameters are consistent with Grade III diastolic dysfunction (restrictive). Elevated left atrial pressure.  2. Right ventricular systolic function is moderately reduced. The right ventricular size is  normal. There is moderately elevated pulmonary artery systolic pressure.  3. Left atrial size was mildly dilated.  4. Right atrial  size was mildly dilated.  5. A small pericardial effusion is present.  6. The mitral valve is normal in structure. Mild mitral valve regurgitation. No evidence of mitral stenosis.  7. Tricuspid valve regurgitation is moderate.  8. The aortic valve is tricuspid. Aortic valve regurgitation is not visualized. No aortic stenosis is present.  9. The inferior vena cava is dilated in size with <50% respiratory variability, suggesting right atrial pressure of 15 mmHg. Comparison(s): No significant change from prior study. FINDINGS  Left Ventricle: Left ventricular ejection fraction, by estimation, is <20%. The left ventricle has severely decreased function. The left ventricle demonstrates global hypokinesis. The left ventricular internal cavity size was mildly dilated. There is no  left ventricular hypertrophy. Left ventricular diastolic parameters are consistent with Grade III diastolic dysfunction (restrictive). Elevated left atrial pressure. Right Ventricle: The right ventricular size is normal. Right ventricular systolic function is moderately reduced. There is moderately elevated pulmonary artery systolic pressure. The tricuspid regurgitant velocity is 3.21 m/s, and with an assumed right atrial pressure of 15 mmHg, the estimated right ventricular systolic pressure is [id number] mmHg. Left Atrium: Left atrial size was mildly dilated. Right Atrium: Right atrial size was mildly dilated. Pericardium: A small pericardial effusion is present. Mitral Valve: The mitral valve is normal in structure. Mild mitral valve regurgitation. No evidence of mitral valve stenosis. Tricuspid Valve: The tricuspid valve is normal in structure. Tricuspid valve regurgitation is moderate . No evidence of tricuspid stenosis. Aortic Valve: The aortic valve is tricuspid. Aortic valve regurgitation is not visualized. No aortic  stenosis is present. Pulmonic Valve: The pulmonic valve was normal in structure. Pulmonic valve regurgitation is mild to moderate. No evidence of pulmonic stenosis. Aorta: The aortic root is normal in size and structure. Venous: The inferior vena cava is dilated in size with less than 50% respiratory variability, suggesting right atrial pressure of 15 mmHg. IAS/Shunts: No atrial level shunt detected by color flow Doppler. Additional Comments: A venous catheter is visualized.  LEFT VENTRICLE PLAX 2D LVIDd:         5.20 cm      Diastology LVIDs:         4.90 cm      LV e' medial:    5.52 cm/s LV PW:         1.50 cm      LV E/e' medial:  19.9 LV IVS:        0.80 cm      LV e' lateral:   12.50 cm/s LVOT diam:     1.80 cm      LV E/e' lateral: 8.8 LV SV:         22 LV SV Index:   14 LVOT Area:     2.54 cm                              3D Volume EF: LV Volumes (MOD)            3D EF:        20 % LV vol d, MOD A2C: 161.0 ml LV EDV:       155 ml LV vol d, MOD A4C: 142.5 ml LV ESV:       125 ml LV vol s, MOD A2C: 135.0 ml LV SV:        30 ml LV vol s, MOD A4C: 122.5 ml LV SV MOD A2C:     26.0 ml LV  SV MOD A4C:     142.5 ml LV SV MOD BP:      23.3 ml RIGHT VENTRICLE            IVC RV S prime:     8.70 cm/s  IVC diam: 2.60 cm TAPSE (M-mode): 1.0 cm LEFT ATRIUM             Index        RIGHT ATRIUM           Index LA diam:        4.20 cm 2.68 cm/m   RA Area:     20.40 cm LA Vol (A2C):   58.9 ml 37.56 ml/m  RA Volume:   58.00 ml  36.99 ml/m LA Vol (A4C):   59.0 ml 37.63 ml/m LA Biplane Vol: 59.4 ml 37.88 ml/m  AORTIC VALVE             PULMONIC VALVE LVOT Vmax:   69.40 cm/s  PR End Diast Vel: 2.76 msec LVOT Vmean:  46.000 cm/s LVOT VTI:    0.087 m  AORTA Ao Root diam: 2.80 cm MITRAL VALVE                TRICUSPID VALVE MV Area (PHT): 5.68 cm     TR Peak grad:   41.2 mmHg MV Decel Time: 134 msec     TR Vmax:        321.00 cm/s MV E velocity: 110.00 cm/s                             SHUNTS                             Systemic  VTI:  0.09 m                             Systemic Diam: 1.80 cm Kirk Ruths MD Electronically signed by Kirk Ruths MD Signature Date/Time: 10/05/2022/12:01:16 PM    Final    MR BRAIN W WO CONTRAST  Result Date: 10/04/2022 CLINICAL DATA:  Initial evaluation for altered mental status. EXAM: MRI HEAD WITHOUT AND WITH CONTRAST TECHNIQUE: Multiplanar, multiecho pulse sequences of the brain and surrounding structures were obtained without and with intravenous contrast. CONTRAST:  37m GADAVIST GADOBUTROL 1 MMOL/ML IV SOLN COMPARISON:  Prior CT from earlier the same day. FINDINGS: Brain: Examination moderately degraded by motion artifact. Cerebral volume within normal limits. Mild hazy FLAIR signal intensity involving the periventricular white matter, nonspecific, but most likely reflecting mild and/or early changes of chronic microvascular ischemic disease. Small remote infarct involving the superior right cerebellum noted (series 7, image 12). No evidence for acute or subacute ischemia. Gray-white matter differentiation maintained. No acute or chronic intracranial blood products. No mass lesion, midline shift or mass effect no hydrocephalus or extra-axial fluid collection. Pituitary gland and suprasellar region. No visible abnormal enhancement. Vascular: Major intracranial vascular flow voids are maintained. Skull and upper cervical spine: Craniocervical junction within normal limits. Bone marrow signal intensity normal. No scalp soft tissue abnormality. Sinuses/Orbits: Globes and orbital soft tissues within normal limits. Paranasal sinuses are largely clear. No mastoid effusion. Other: None. IMPRESSION: 1. No acute intracranial abnormality. 2. Small remote right cerebellar infarct. 3. Mild hazy FLAIR signal intensity involving the periventricular white matter, nonspecific, but most likely reflecting mild/early changes of chronic  microvascular ischemic disease. Electronically Signed   By: Jeannine Boga M.D.    On: 10/04/2022 20:56   EEG adult  Result Date: 10/04/2022 Lora Havens, MD     10/04/2022  5:49 PM Patient Name: Nidhi Meccia MRN: [id number] Epilepsy Attending: Lora Havens Referring Physician/Provider: Karmen Bongo, MD Date: 10/04/2022 Duration: 23.26 mins Patient history: 27yo F with ams. EEG to evaluate for seizure Level of alertness: Awake, asleep AEDs during EEG study: Clonazepam Technical aspects: This EEG study was done with scalp electrodes positioned according to the 10-20 International system of electrode placement. Electrical activity was reviewed with band pass filter of 1-70Hz$ , sensitivity of 7 uV/mm, display speed of 1m/sec with a 60Hz$  notched filter applied as appropriate. EEG data were recorded continuously and digitally stored.  Video monitoring was available and reviewed as appropriate. Description: The posterior dominant rhythm consists of 9Hz$  activity of moderate voltage (25-35 uV) seen predominantly in posterior head regions, symmetric and reactive to eye opening and eye closing. Sleep was characterized by vertex waves, sleep spindles (12 to 14 Hz), maximal frontocentral region.  Hyperventilation and photic stimulation were not performed.   IMPRESSION: This study is within normal limits. No seizures or epileptiform discharges were seen throughout the recording. PLora Havens  CT Head Wo Contrast  Result Date: 10/04/2022 CLINICAL DATA:  27year old female with seizure-like activity, nausea vomiting. Type 1 diabetes. EXAM: CT HEAD WITHOUT CONTRAST TECHNIQUE: Contiguous axial images were obtained from the base of the skull through the vertex without intravenous contrast. RADIATION DOSE REDUCTION: This exam was performed according to the departmental dose-optimization program which includes automated exposure control, adjustment of the mA and/or kV according to patient size and/or use of iterative reconstruction technique. COMPARISON:  None Available. FINDINGS: Brain: No  midline shift, ventriculomegaly, mass effect, evidence of mass lesion, intracranial hemorrhage or evidence of cortically based acute infarction. Small cavum septum pellucidum, normal variant. Small chronic appearing, circumscribed infarct of the right superior cerebellar artery territory (coronal image 23). No cerebral cortical encephalomalacia identified and elsewhere gray-white differentiation is within normal limits. Vascular: Very faint Calcified atherosclerosis at the skull base. Skull: Negative. Sinuses/Orbits: Visualized paranasal sinuses and mastoids are clear. Other: Visualized orbits and scalp soft tissues are within normal limits. IMPRESSION: 1. 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 2. No acute intracranial abnormality. And otherwise negative noncontrast CT appearance of the brain. Electronically Signed   By: HGenevie AnnM.D.   On: 10/04/2022 05:08   IR Fluoro Guide CV Line Right  Result Date: 09/14/2022 INDICATION: 27year old for hemodialysis catheter EXAM: TUNNELED CENTRAL VENOUS HEMODIALYSIS CATHETER PLACEMENT WITH ULTRASOUND AND FLUOROSCOPIC GUIDANCE MEDICATIONS: 2 g Ancef. The antibiotic was given in an appropriate time interval prior to skin puncture. ANESTHESIA/SEDATION: Moderate (conscious) sedation was employed during this procedure. A total of Versed 1.0 mg and Fentanyl 25 mcg was administered intravenously. Moderate Sedation Time: 14 minutes. The patient's level of consciousness and vital signs were monitored continuously by radiology nursing throughout the procedure under my direct supervision. FLUOROSCOPY TIME:  Fluoroscopy Time: 0 minutes 6 seconds (1 mGy). COMPLICATIONS: None PROCEDURE: Informed written consent was obtained from the patient after a discussion of the risks, benefits, and alternatives to treatment. Questions regarding the procedure were encouraged and answered. The right neck and chest were prepped with chlorhexidine in a sterile fashion, and a sterile  drape was applied covering the operative field. Maximum barrier sterile technique with sterile gowns and gloves were used for the procedure. A timeout was  performed prior to the initiation of the procedure. Ultrasound survey was performed. The right internal jugular vein was confirmed to be patent, with images stored and sent to PACS. Micropuncture kit was utilized to access the right internal jugular vein under direct, real-time ultrasound guidance after the overlying soft tissues were anesthetized with 1% lidocaine with epinephrine. Stab incision was made with 11 blade scalpel. Microwire was passed centrally. The microwire was then marked to measure appropriate internal catheter length. External tunneled length was estimated. A total tip to cuff length of 19 cm was selected. 035 guidewire was advanced to the level of the IVC. Skin and subcutaneous tissues of chest wall below the clavicle were generously infiltrated with 1% lidocaine for local anesthesia. A small stab incision was made with 11 blade scalpel. The selected hemodialysis catheter was tunneled in a retrograde fashion from the anterior chest wall to the venotomy incision. Serial dilation was performed and then a peel-away sheath was placed. The catheter was then placed through the peel-away sheath with tips ultimately positioned within the superior aspect of the right atrium. Final catheter positioning was confirmed and documented with a spot radiographic image. The catheter aspirates and flushes normally. The catheter was flushed with appropriate volume heparin dwells. The catheter exit site was secured with a 0-Prolene retention suture. Gel-Foam slurry was infused into the soft tissue tract. The venotomy incision was closed Derma bond and sterile dressing. Dressings were applied at the chest wall. Patient tolerated the procedure well and remained hemodynamically stable throughout. No complications were encountered and no significant blood loss  encountered. IMPRESSION: Status post right IJ tunneled hemodialysis catheter. Signed, Dulcy Fanny. Nadene Rubins, RPVI [facility] Radiology Electronically Signed   By: Corrie Mckusick D.O.   On: 09/14/2022 17:24   IR US Guide Vasc Access Right  Result Date: 09/14/2022 INDICATION: 27 year old for hemodialysis catheter EXAM: TUNNELED CENTRAL VENOUS HEMODIALYSIS CATHETER PLACEMENT WITH ULTRASOUND AND FLUOROSCOPIC GUIDANCE MEDICATIONS: 2 g Ancef. The antibiotic was given in an appropriate time interval prior to skin puncture. ANESTHESIA/SEDATION: Moderate (conscious) sedation was employed during this procedure. A total of Versed 1.0 mg and Fentanyl 25 mcg was administered intravenously. Moderate Sedation Time: 14 minutes. The patient's level of consciousness and vital signs were monitored continuously by radiology nursing throughout the procedure under my direct supervision. FLUOROSCOPY TIME:  Fluoroscopy Time: 0 minutes 6 seconds (1 mGy). COMPLICATIONS: None PROCEDURE: Informed written consent was obtained from the patient after a discussion of the risks, benefits, and alternatives to treatment. Questions regarding the procedure were encouraged and answered. The right neck and chest were prepped with chlorhexidine in a sterile fashion, and a sterile drape was applied covering the operative field. Maximum barrier sterile technique with sterile gowns and gloves were used for the procedure. A timeout was performed prior to the initiation of the procedure. Ultrasound survey was performed. The right internal jugular vein was confirmed to be patent, with images stored and sent to PACS. Micropuncture kit was utilized to access the right internal jugular vein under direct, real-time ultrasound guidance after the overlying soft tissues were anesthetized with 1% lidocaine with epinephrine. Stab incision was made with 11 blade scalpel. Microwire was passed centrally. The  microwire was then marked to measure appropriate internal catheter length. External tunneled length was estimated. A total tip to cuff length of 19 cm was selected. 035 guidewire was advanced to the level of the IVC. Skin and subcutaneous tissues of chest wall below the clavicle were  generously infiltrated with 1% lidocaine for local anesthesia. A small stab incision was made with 11 blade scalpel. The selected hemodialysis catheter was tunneled in a retrograde fashion from the anterior chest wall to the venotomy incision. Serial dilation was performed and then a peel-away sheath was placed. The catheter was then placed through the peel-away sheath with tips ultimately positioned within the superior aspect of the right atrium. Final catheter positioning was confirmed and documented with a spot radiographic image. The catheter aspirates and flushes normally. The catheter was flushed with appropriate volume heparin dwells. The catheter exit site was secured with a 0-Prolene retention suture. Gel-Foam slurry was infused into the soft tissue tract. The venotomy incision was closed Derma bond and sterile dressing. Dressings were applied at the chest wall. Patient tolerated the procedure well and remained hemodynamically stable throughout. No complications were encountered and no significant blood loss encountered. IMPRESSION: Status post right IJ tunneled hemodialysis catheter. Signed, Dulcy Fanny. Nadene Rubins, RPVI [facility] Radiology Electronically Signed   By: Corrie Mckusick D.O.   On: 09/14/2022 17:24   DG Abdomen 1 View  Result Date: 09/11/2022 CLINICAL DATA:  Stomach distention, concern for bowel obstruction. EXAM: ABDOMEN - 1 VIEW COMPARISON:  05/20/2021. FINDINGS: The bowel gas pattern is normal. No radio-opaque calculi. A peritoneal dialysis catheter is noted with tip coiling in the pelvis. The heart is enlarged. IMPRESSION: 1. No bowel obstruction. 2.  Peritoneal dialysis catheter terminating in the pelvis. 3. Cardiomegaly. Electronically Signed   By: Brett Fairy M.D.   On: 09/11/2022 23:50   DG Chest 2 View  Result Date: 09/11/2022 CLINICAL DATA:  Chest pain, dizziness EXAM: CHEST - 2 VIEW COMPARISON:  08/24/2022 FINDINGS: Frontal and lateral views of the chest demonstrates stable enlargement of the cardiac silhouette. No airspace disease, effusion, or pneumothorax. No acute bony abnormality. IMPRESSION: 1. Stable enlarged cardiac silhouette. 2. No acute airspace disease. Electronically Signed   By: Randa Ngo M.D.   On: 09/11/2022 22:12    Microbiology: Results for orders placed or performed during the hospital encounter of 10/04/22  Resp panel by RT-PCR (RSV, Flu A&B, Covid) Anterior Nasal Swab     Status: None   Collection Time: 10/04/22  3:25 AM   Specimen: Anterior Nasal Swab  Result Value Ref Range Status   SARS Coronavirus 2 by RT PCR NEGATIVE NEGATIVE Final    Comment: (NOTE) SARS-CoV-2 target nucleic acids are NOT DETECTED.  The SARS-CoV-2 RNA is generally detectable in upper respiratory specimens during the acute phase of infection. The lowest concentration of SARS-CoV-2 viral copies this assay can detect is 138 copies/mL. A negative result does not preclude SARS-Cov-2 infection and should not be used as the sole basis for treatment or other patient management decisions. A negative result may occur with  improper specimen collection/handling, submission of specimen other than nasopharyngeal swab, presence of viral mutation(s) within the areas targeted by this assay, and inadequate number of viral copies(<138 copies/mL). A negative result must be combined with clinical observations, patient history, and epidemiological information. The expected result is Negative.  Fact Sheet for Patients:  EntrepreneurPulse.com.au  Fact Sheet for Healthcare Providers:   IncredibleEmployment.be  This test is no t yet approved or cleared by the [place] FDA and  has been authorized for detection and/or diagnosis of SARS-CoV-2 by FDA under an Emergency Use Authorization (EUA). This EUA will remain  in effect (meaning this test can be used) for the duration of the  COVID-19 declaration under Section 564(b)(1) of the Act, 21 U.S.C.section 360bbb-3(b)(1), unless the authorization is terminated  or revoked sooner.       Influenza A by PCR NEGATIVE NEGATIVE Final   Influenza B by PCR NEGATIVE NEGATIVE Final    Comment: (NOTE) The Xpert Xpress SARS-CoV-2/FLU/RSV plus assay is intended as an aid in the diagnosis of influenza from Nasopharyngeal swab specimens and should not be used as a sole basis for treatment. Nasal washings and aspirates are unacceptable for Xpert Xpress SARS-CoV-2/FLU/RSV testing.  Fact Sheet for Patients: EntrepreneurPulse.com.au  Fact Sheet for Healthcare Providers: IncredibleEmployment.be  This test is not yet approved or cleared by the [place] FDA and has been authorized for detection and/or diagnosis of SARS-CoV-2 by FDA under an Emergency Use Authorization (EUA). This EUA will remain in effect (meaning this test can be used) for the duration of the COVID-19 declaration under Section 564(b)(1) of the Act, 21 U.S.C. section 360bbb-3(b)(1), unless the authorization is terminated or revoked.     Resp Syncytial Virus by PCR NEGATIVE NEGATIVE Final    Comment: (NOTE) Fact Sheet for Patients: EntrepreneurPulse.com.au  Fact Sheet for Healthcare Providers: IncredibleEmployment.be  This test is not yet approved or cleared by the [place] FDA and has been authorized for detection and/or diagnosis of SARS-CoV-2 by FDA under an Emergency Use Authorization (EUA). This EUA will remain in effect (meaning this test can be used) for  the duration of the COVID-19 declaration under Section 564(b)(1) of the Act, 21 U.S.C. section 360bbb-3(b)(1), unless the authorization is terminated or revoked.  Performed at [facility], [address] [place], [place] [address]   MRSA Next Gen by PCR, Nasal     Status: None   Collection Time: 10/04/22  1:58 PM   Specimen: Nasal Mucosa; Nasal Swab  Result Value Ref Range Status   MRSA by PCR Next Gen NOT DETECTED NOT DETECTED Final    Comment: (NOTE) The GeneXpert MRSA Assay (FDA approved for NASAL specimens only), is one component of a comprehensive MRSA colonization surveillance program. It is not intended to diagnose MRSA infection nor to guide or monitor treatment for MRSA infections. Test performance is not FDA approved in patients less than 71 years old. Performed at [facility], [place] [address], [place], [place] [address]     Labs: CBC: Recent Labs  Lab 10/03/22 1142 10/04/22 0303 10/04/22 0326 10/05/22 0332 10/07/22 0735  WBC 5.0 4.2  --  5.8 4.8  NEUTROABS  --  3.0  --   --   --   HGB 13.8 12.5 14.3 13.2 13.9  HCT 43.9 40.7 42.0 41.9 44.6  MCV 100.0 102.0*  --  101.5* 101.6*  PLT 155 172  --  202 0000000   Basic Metabolic Panel: Recent Labs  Lab 10/03/22 1142 10/04/22 0315 10/04/22 0322 10/04/22 0326 10/05/22 0332 10/07/22 0823  NA 134*  --  136 136 139 134*  K 3.6  --  4.4 4.4 5.3* 5.1  CL 101  --  101  --  102 95*  CO2 24  --  21*  --  15* 22  GLUCOSE 155*  --  169*  --  180* 166*  BUN 34*  --  39*  --  53* 49*  CREATININE 5.83*  --  7.04*  --  8.38* 8.22*  CALCIUM 7.7*  --  8.5*  --  8.8* 7.8*  MG  --  2.0  --   --   --   --  PHOS  --   --   --   --  8.2* 7.8*   Liver Function Tests: Recent Labs  Lab 10/05/22 0332 10/07/22 0823  ALBUMIN 3.2* 2.7*   CBG: Recent Labs  Lab 10/07/22 0608 10/07/22 1430 10/07/22 1656 10/07/22 2051 10/08/22 0618  GLUCAP 132* 189* 196* 124* 119*    Discharge time spent: greater than  30 minutes.  Signed: Tawni Millers, MD [facility] 10/08/2022

## 2022-10-09 NOTE — Progress Notes (Signed)
Late Entry Note  Contacted [facility] and spoke to [place]. Clinic advised pt was d/c yesterday and should resume care tomorrow. Clinic has access to [facility] Epic to print needed clinicals.   Melven Sartorius Renal Navigator [phone]

## 2022-10-20 ENCOUNTER — Emergency Department (HOSPITAL_COMMUNITY)
Admission: EM | Admit: 2022-10-20 | Discharge: 2022-10-20 | Disposition: A | Discharge status: Home / Self Care | Payer: Medicaid Other | Attending: Emergency Medicine | Admitting: Emergency Medicine

## 2022-10-20 DIAGNOSIS — N186 End stage renal disease: Secondary | ICD-10-CM | POA: Diagnosis not present

## 2022-10-20 DIAGNOSIS — Z992 Dependence on renal dialysis: Secondary | ICD-10-CM | POA: Insufficient documentation

## 2022-10-20 DIAGNOSIS — I509 Heart failure, unspecified: Secondary | ICD-10-CM | POA: Insufficient documentation

## 2022-10-20 DIAGNOSIS — U071 COVID-19: Secondary | ICD-10-CM | POA: Diagnosis not present

## 2022-10-20 DIAGNOSIS — R112 Nausea with vomiting, unspecified: Secondary | ICD-10-CM

## 2022-10-20 DIAGNOSIS — Z794 Long term (current) use of insulin: Secondary | ICD-10-CM | POA: Diagnosis not present

## 2022-10-20 DIAGNOSIS — R1084 Generalized abdominal pain: Secondary | ICD-10-CM | POA: Insufficient documentation

## 2022-10-20 DIAGNOSIS — E1022 Type 1 diabetes mellitus with diabetic chronic kidney disease: Secondary | ICD-10-CM | POA: Insufficient documentation

## 2022-10-20 LAB — LIPASE, BLOOD: Lipase: 26 U/L (ref 11–51)

## 2022-10-20 LAB — COMPREHENSIVE METABOLIC PANEL
ALT: 27 U/L (ref 0–44)
AST: 28 U/L (ref 15–41)
Albumin: 2.6 g/dL — ABNORMAL LOW (ref 3.5–5.0)
Alkaline Phosphatase: 95 U/L (ref 38–126)
Anion gap: 16 — ABNORMAL HIGH (ref 5–15)
BUN: 51 mg/dL — ABNORMAL HIGH (ref 6–20)
CO2: 19 mmol/L — ABNORMAL LOW (ref 22–32)
Calcium: 8.4 mg/dL — ABNORMAL LOW (ref 8.9–10.3)
Chloride: 98 mmol/L (ref 98–111)
Creatinine, Ser: 6.06 mg/dL — ABNORMAL HIGH (ref 0.44–1.00)
GFR, Estimated: 9 mL/min — ABNORMAL LOW (ref >60)
Glucose, Bld: 234 mg/dL — ABNORMAL HIGH (ref 70–99)
Potassium: 5 mmol/L (ref 3.5–5.1)
Sodium: 133 mmol/L — ABNORMAL LOW (ref 135–145)
Total Bilirubin: 0.6 mg/dL (ref 0.3–1.2)
Total Protein: 5.6 g/dL — ABNORMAL LOW (ref 6.5–8.1)

## 2022-10-20 LAB — CBC WITH DIFFERENTIAL/PLATELET
Abs Immature Granulocytes: 0.01 10*3/uL (ref 0.00–0.07)
Basophils Absolute: 0.1 10*3/uL (ref 0.0–0.1)
Basophils Relative: 2 %
Eosinophils Absolute: 0.2 10*3/uL (ref 0.0–0.5)
Eosinophils Relative: 6 %
HCT: 38 % (ref 36.0–46.0)
Hemoglobin: 11.7 g/dL — ABNORMAL LOW (ref 12.0–15.0)
Immature Granulocytes: 0 %
Lymphocytes Relative: 35 %
Lymphs Abs: 1.2 10*3/uL (ref 0.7–4.0)
MCH: 31.5 pg (ref 26.0–34.0)
MCHC: 30.8 g/dL (ref 30.0–36.0)
MCV: 102.2 fL — ABNORMAL HIGH (ref 80.0–100.0)
Monocytes Absolute: 0.4 10*3/uL (ref 0.1–1.0)
Monocytes Relative: 11 %
Neutro Abs: 1.5 10*3/uL — ABNORMAL LOW (ref 1.7–7.7)
Neutrophils Relative %: 46 %
Platelets: 239 10*3/uL (ref 150–400)
RBC: 3.72 MIL/uL — ABNORMAL LOW (ref 3.87–5.11)
RDW: 15.5 % (ref 11.5–15.5)
WBC: 3.3 10*3/uL — ABNORMAL LOW (ref 4.0–10.5)
nRBC: 0 % (ref 0.0–0.2)

## 2022-10-20 LAB — I-STAT BETA HCG BLOOD, ED (MC, WL, AP ONLY): I-stat hCG, quantitative: <5 m[IU]/mL (ref <5)

## 2022-10-20 LAB — RESP PANEL BY RT-PCR (RSV, FLU A&B, COVID)  RVPGX2
Influenza A by PCR: NEGATIVE
Influenza B by PCR: NEGATIVE
Resp Syncytial Virus by PCR: NEGATIVE
SARS Coronavirus 2 by RT PCR: POSITIVE — AB

## 2022-10-20 LAB — CBG MONITORING, ED: Glucose-Capillary: 242 mg/dL — ABNORMAL HIGH (ref 70–99)

## 2022-10-20 MED ORDER — SODIUM CHLORIDE 0.9 % IV BOLUS
250.0000 mL | Freq: Once | INTRAVENOUS | Status: AC
  Administered 2022-10-20: 250 mL via INTRAVENOUS

## 2022-10-20 MED ORDER — METOCLOPRAMIDE HCL 5 MG/ML IJ SOLN
10.0000 mg | Freq: Once | INTRAMUSCULAR | Status: AC
  Administered 2022-10-20: 10 mg via INTRAVENOUS
  Filled 2022-10-20: qty 2

## 2022-10-20 NOTE — ED Provider Notes (Addendum)
Patient presenting with nausea vomiting and abdominal pain with some mild nasal congestion that started yesterday.  Patient did have a course of dialysis yesterday but reports she has not been able to eat today.  On repeat evaluation patient reports her abdomen no longer hurts but she is still feeling nauseated.  After Reglan she reports she feels much better and would like to go home.  I independently interpreted patient's labs and CBC with leukopenia with 3.3 most likely related to COVID, hemoglobin of 11.7 from 13 which will need to be watched, CMP with findings consistent with end-stage renal disease but normal potassium today.  Blood sugar of 234 and anion gap of 16 which is improved from her prior and low suspicion for DKA at this time feel more likely the anion gap is related to her end-stage renal disease.  She is COVID-positive.  Findings discussed with the patient.  She reports she feels comfortable going home.  Patient was able to hold down fluids here.  She walked to the bathroom with no distress and otherwise feels okay.  She was given return precautions.   Blanchie Dessert, MD 10/20/22 2142    Blanchie Dessert, MD 10/20/22 2142

## 2022-10-20 NOTE — ED Provider Notes (Signed)
[facility] Provider Note   CSN: [id number] Arrival date & time: 10/20/22  1617     History ESRD, DM, HF EF 20% No chief complaint on file.   Kathy Lewis is a 27 y.o. female.  27 y.o female with a PMH ESRD on HD (recently started), T1DM, EF 20% presents to the ED with a chief complaint of nausea, vomiting, abdominal pain which began yesterday.  Patient does have a history of peritoneal dialysis Tuesday, Thursday, Saturday last wed yesterday and received a full treatment.  She endorses lower abdominal cramping, ongoing episodes of nausea and vomiting without any blood present.  She also reports feeling somewhat short of breath, has not tried taking any medication for improvement in her symptoms.  She was found to be febrile with EMS at 100.3 oral temp.  She was given Zofran as well with EMS without any improvement in symptoms.  She is a type I diabetic CBG was 313 checked by EMS. No chest pain, no sick contacts.    The history is provided by the patient.       Home Medications Prior to Admission medications   Medication Sig Start Date End Date Taking? Authorizing Provider  acetaminophen (TYLENOL) 500 MG tablet Take 1,000 mg by mouth as needed for headache (pain).    [provider]  amitriptyline (ELAVIL) 50 MG tablet Take 1 tablet (50 mg total) by mouth at bedtime. 08/30/22   Salley Slaughter, NP  amLODipine (NORVASC) 10 MG tablet Take 1 tablet (10 mg total) by mouth daily. 10/09/22 11/08/22  Arrien, Jimmy Picket, MD  atorvastatin (LIPITOR) 40 MG tablet Take 40 mg by mouth every evening.    [provider]  calcium acetate (PHOSLO) 667 MG capsule Take 1,334 mg by mouth 3 (three) times daily with meals. 09/28/22   [provider]  carvedilol (COREG) 12.5 MG tablet Take 1 tablet (12.5 mg total) by mouth 2 (two) times daily with a meal. 10/08/22 11/07/22  Arrien, Jimmy Picket, MD  clonazePAM (KLONOPIN) 0.5 MG  tablet Take 1 tablet (0.5 mg total) by mouth 2 (two) times daily as needed for anxiety. Patient taking differently: Take 0.5 mg by mouth as needed for anxiety. 03/16/22   [name], MD  hydrALAZINE (APRESOLINE) 50 MG tablet Take 1 tablet (50 mg total) by mouth in the morning and at bedtime. 10/08/22 10/08/23  Arrien, Jimmy Picket, MD  hydrOXYzine (ATARAX) 10 MG tablet Take 1 tablet (10 mg total) by mouth 3 (three) times daily as needed. Patient taking differently: Take 10 mg by mouth at bedtime. 08/30/22   Eulis Canner E, NP  insulin lispro (HUMALOG) 100 UNIT/ML injection Inject 0.5 Units into the skin See admin instructions. 0.5 units per hour via insulin pump. 01/08/22   [provider]  isosorbide mononitrate (IMDUR) 30 MG 24 hr tablet Take 1 tablet (30 mg total) by mouth daily. 10/09/22 11/08/22  Arrien, Jimmy Picket, MD  magnesium oxide (MAG-OX) 400 MG tablet Take 800 mg by mouth daily.    [provider]  methocarbamol (ROBAXIN) 500 MG tablet Take 1 tablet (500 mg total) by mouth 2 (two) times daily as needed for up to 15 doses for muscle spasms. Patient taking differently: Take 500 mg by mouth as needed for muscle spasms. 09/06/22   Wyvonnia Dusky, MD  metoCLOPramide (REGLAN) 10 MG tablet Take 10 mg by mouth as needed for nausea or vomiting. 08/15/22   [provider]  Nutritional Supplements (FEEDING SUPPLEMENT, NEPRO CARB STEADY,) LIQD Take 237 mLs by mouth 3 (three) times daily as needed (Supplement). 10/08/22 11/07/22  Arrien, Jimmy Picket, MD  ondansetron (ZOFRAN) 4 MG tablet Take 4 mg by mouth as needed for nausea or vomiting.    [provider]  pantoprazole (PROTONIX) 40 MG tablet Take 1 tablet (40 mg total) by mouth daily. 10/08/22 11/07/22  Arrien, Jimmy Picket, MD  Vitamin D, Ergocalciferol, (DRISDOL) 1.25 MG (50000 UNIT) CAPS capsule Take 50,000 Units by mouth every Sunday.    [provider]  escitalopram (LEXAPRO) 10 MG  tablet Take 20 mg by mouth daily.  05/10/20 10/06/20  [provider]  furosemide (LASIX) 40 MG tablet Take 1 tablet (40 mg total) by mouth daily. Patient not taking: Reported on 02/16/2021 10/28/19 02/17/21  [name], MD  lisinopril (ZESTRIL) 10 MG tablet Take 10 mg by mouth daily.  02/23/20 10/06/20  [provider]  spironolactone (ALDACTONE) 25 MG tablet Take 25 mg by mouth daily.  04/20/20 10/06/20  [provider]      Allergies    Cantaloupe extract allergy skin test, Strawberry extract, Citrullus vulgaris, Food, and Nsaids    Review of Systems   Review of Systems  Constitutional:  Positive for fever. Negative for chills.  HENT:  Negative for sore throat.   Respiratory:  Positive for shortness of breath.   Cardiovascular:  Negative for chest pain.  Gastrointestinal:  Positive for abdominal pain, nausea and vomiting.  Genitourinary:  Negative for difficulty urinating and flank pain.  Musculoskeletal:  Negative for back pain.  All other systems reviewed and are negative.   Physical Exam Updated Vital Signs BP 126/78   Pulse 83   Temp 98 F (36.7 C)   Resp 18   SpO2 99%  Physical Exam Vitals and nursing note reviewed.  Constitutional:      General: She is not in acute distress.    Appearance: She is well-developed.  HENT:     Head: Normocephalic and atraumatic.     Mouth/Throat:     Pharynx: No oropharyngeal exudate.  Eyes:     Pupils: Pupils are equal, round, and reactive to light.  Cardiovascular:     Rate and Rhythm: Regular rhythm.     Heart sounds: Normal heart sounds.  Pulmonary:     Effort: Pulmonary effort is normal. No respiratory distress.     Breath sounds: Rales present.  Abdominal:     General: Bowel sounds are normal. There is no distension.     Palpations: Abdomen is soft.     Tenderness: There is abdominal tenderness. There is no guarding or rebound.  Musculoskeletal:        General: No tenderness or deformity.     Cervical  back: Normal range of motion.     Right lower leg: No edema.     Left lower leg: No edema.  Skin:    General: Skin is warm and dry.  Neurological:     Mental Status: She is alert and oriented to person, place, and time.     ED Results / Procedures / Treatments   Labs (all labs ordered are listed, but only abnormal results are displayed) Labs Reviewed  RESP PANEL BY RT-PCR (RSV, FLU A&B, COVID)  [id number] - Abnormal; Notable for the following components:      Result Value   SARS Coronavirus 2 by RT PCR POSITIVE (*)    All other components within normal limits  COMPREHENSIVE  METABOLIC PANEL - Abnormal; Notable for the following components:   Sodium 133 (*)    CO2 19 (*)    Glucose, Bld 234 (*)    BUN 51 (*)    Creatinine, Ser 6.06 (*)    Calcium 8.4 (*)    Total Protein 5.6 (*)    Albumin 2.6 (*)    GFR, Estimated 9 (*)    Anion gap 16 (*)    All other components within normal limits  CBC WITH DIFFERENTIAL/PLATELET - Abnormal; Notable for the following components:   WBC 3.3 (*)    RBC 3.72 (*)    Hemoglobin 11.7 (*)    MCV 102.2 (*)    Neutro Abs 1.5 (*)    All other components within normal limits  CBG MONITORING, ED - Abnormal; Notable for the following components:   Glucose-Capillary 242 (*)    All other components within normal limits  CULTURE, BLOOD (ROUTINE X 2)  CULTURE, BLOOD (ROUTINE X 2)  LIPASE, BLOOD  I-STAT BETA HCG BLOOD, ED ([facility], [facility], [facility] ONLY)    EKG None  Radiology No results found.  Procedures Procedures    Medications Ordered in ED Medications  sodium chloride 0.9 % bolus 250 mL (0 mLs Intravenous Stopped 10/20/22 1951)  metoCLOPramide (REGLAN) injection 10 mg (10 mg Intravenous Given 10/20/22 2103)    ED Course/ Medical Decision Making/ A&P Clinical Course as of 10/28/22 1239  Fri Oct 20, 2022  1805 SARS Coronavirus 2 by RT PCR(!): POSITIVE [JS]    Clinical Course User Index [JS] Janeece Fitting, PA-C                             Medical  Decision Making Amount and/or Complexity of Data Reviewed Labs: ordered. Decision-making details documented in ED Course.  Risk Prescription drug management.    This patient presents to the ED for concern of nausea, vomiting, this involves a number of treatment options, and is a complaint that carries with it a high risk of complications and morbidity.  The differential diagnosis includes DKA, gastroenteritis versus acute intraabdominal pathology.    Co morbidities: Discussed in HPI   Brief History:  Patient with underlying history of diabetes, ESRD on dialysis Tuesday, Thursday, Saturday last dialyzed yesterday with a full treatment presents to the ED today with a chief complaint of generalized abdominal pain, nausea and vomiting.  Patient reports multiple episodes of nausea and vomiting.  Described as a lower abdominal cramping no improvement in her symptoms she did not take any medication for this.  No sick contacts, did arrive in the ED febrile with temperature of 100.3, although this was rechecked by her staff and was afebrile.  She does not make urine at this time.  EMR reviewed including pt PMHx, past surgical history and past visits to ER.   See HPI for more details   Lab Tests:  IPending  Medicines ordered:  I ordered medication including fentanyl  for pain control Reevaluation of the patient after these medicines showed that the patient stayed the same I have reviewed the patients home medicines and have made adjustments as needed  Reevaluation:  Unable to reassess during my shift.   Social Determinants of Health:  The patient's social determinants of health were a factor in the care of this patient   Problem List / ED Course:  Patient presents the ED with abdominal pain, nausea, vomiting which began yesterday.  She is a  dialysis patient along with peritoneal dialysis at home, last dialyzed yesterday and received her full treatment.  Here endorsing no  improvement in her nausea and vomiting but denies any sick contacts.  She has not taken anything for pain control.  She was found to be febrile with EMS at 100.3, this temperature was later rechecked by [place] which did not show patient to have a fever.  On exam she is not guarding, abdomen is somewhat distended but bowel sounds are present.  She does not make any urine I do not suspect a urinary source at this time.  She is overall hemodynamically stable aside from elevated blood pressure.  Respiratory panel was obtained and she is COVID-19 positive.  She does have a recent admission earlier this month due to nausea and vomiting as well and a recent echocardiogram which showed 1. Left ventricular ejection fraction, by estimation, is <20% , she was given a 250 bolus to help with her symptoms.  She remains afebrile here.  Patient is a difficult stick, therefore IV team was called to obtain further blood work for patient.   Dispostion:  Patient was pending labs, further disposition.  This patient was signed out to my attending Dr. Maryan Rued, please see her note for full disposition.    Portions of this note were generated with Lobby[employer]. Dictation errors may occur despite best attempts at proofreading.   Final Clinical Impression(s) / ED Diagnoses Final diagnoses:  Nausea and vomiting, unspecified vomiting type  COVID    Rx / DC Orders ED Discharge Orders     None         Janeece Fitting, PA-C 10/28/22 1239    Blanchie Dessert, MD 10/30/22 [phone]

## 2022-10-20 NOTE — ED Triage Notes (Signed)
Patient BIB EMS from home for ABD pain. Dialysis T/Th/Sat. Patient does Peritoneal  and Hemodialysis. C/O pain to peritoneal access.  100.3 Temp, 20g LAC. Zofran '4mg'$  IV given. CBG 313. VSS

## 2022-10-20 NOTE — Discharge Instructions (Signed)
Continue to go to dialysis.  Continue all your current medications.  You can take Tylenol as needed for fever and use your Zofran as needed for nausea.  If you start having any shortness of breath, worsening vomiting cannot hold anything down and sugars are uncontrolled return to the emergency room.

## 2022-10-21 ENCOUNTER — Other Ambulatory Visit: Payer: Self-pay

## 2022-10-21 ENCOUNTER — Observation Stay (HOSPITAL_COMMUNITY)
Admission: EM | Admit: 2022-10-21 | Discharge: 2022-10-22 | Discharge status: Against Medical Advice | Payer: Medicaid Other | Attending: Internal Medicine | Admitting: Internal Medicine

## 2022-10-21 ENCOUNTER — Encounter (HOSPITAL_COMMUNITY): Payer: Self-pay

## 2022-10-21 ENCOUNTER — Emergency Department (HOSPITAL_COMMUNITY): Payer: Medicaid Other

## 2022-10-21 DIAGNOSIS — E109 Type 1 diabetes mellitus without complications: Secondary | ICD-10-CM | POA: Insufficient documentation

## 2022-10-21 DIAGNOSIS — I10 Essential (primary) hypertension: Secondary | ICD-10-CM | POA: Diagnosis present

## 2022-10-21 DIAGNOSIS — Z992 Dependence on renal dialysis: Secondary | ICD-10-CM | POA: Insufficient documentation

## 2022-10-21 DIAGNOSIS — R6510 Systemic inflammatory response syndrome (SIRS) of non-infectious origin without acute organ dysfunction: Secondary | ICD-10-CM | POA: Insufficient documentation

## 2022-10-21 DIAGNOSIS — Z87891 Personal history of nicotine dependence: Secondary | ICD-10-CM | POA: Diagnosis not present

## 2022-10-21 DIAGNOSIS — I5022 Chronic systolic (congestive) heart failure: Secondary | ICD-10-CM | POA: Diagnosis not present

## 2022-10-21 DIAGNOSIS — E1022 Type 1 diabetes mellitus with diabetic chronic kidney disease: Secondary | ICD-10-CM | POA: Insufficient documentation

## 2022-10-21 DIAGNOSIS — U071 COVID-19: Secondary | ICD-10-CM | POA: Diagnosis not present

## 2022-10-21 DIAGNOSIS — R0602 Shortness of breath: Secondary | ICD-10-CM | POA: Diagnosis present

## 2022-10-21 DIAGNOSIS — R9431 Abnormal electrocardiogram [ECG] [EKG]: Secondary | ICD-10-CM | POA: Diagnosis present

## 2022-10-21 DIAGNOSIS — R112 Nausea with vomiting, unspecified: Secondary | ICD-10-CM | POA: Insufficient documentation

## 2022-10-21 DIAGNOSIS — I132 Hypertensive heart and chronic kidney disease with heart failure and with stage 5 chronic kidney disease, or end stage renal disease: Secondary | ICD-10-CM | POA: Diagnosis not present

## 2022-10-21 DIAGNOSIS — Z79899 Other long term (current) drug therapy: Secondary | ICD-10-CM | POA: Insufficient documentation

## 2022-10-21 DIAGNOSIS — Z794 Long term (current) use of insulin: Secondary | ICD-10-CM | POA: Diagnosis not present

## 2022-10-21 DIAGNOSIS — N186 End stage renal disease: Secondary | ICD-10-CM

## 2022-10-21 LAB — LACTIC ACID, PLASMA: Lactic Acid, Venous: 1.5 mmol/L (ref 0.5–1.9)

## 2022-10-21 LAB — COMPREHENSIVE METABOLIC PANEL
ALT: 23 U/L (ref 0–44)
AST: 38 U/L (ref 15–41)
Albumin: 2.5 g/dL — ABNORMAL LOW (ref 3.5–5.0)
Alkaline Phosphatase: 93 U/L (ref 38–126)
Anion gap: 14 (ref 5–15)
BUN: 27 mg/dL — ABNORMAL HIGH (ref 6–20)
CO2: 22 mmol/L (ref 22–32)
Calcium: 8.4 mg/dL — ABNORMAL LOW (ref 8.9–10.3)
Chloride: 98 mmol/L (ref 98–111)
Creatinine, Ser: 4.32 mg/dL — ABNORMAL HIGH (ref 0.44–1.00)
GFR, Estimated: 14 mL/min — ABNORMAL LOW (ref >60)
Glucose, Bld: 108 mg/dL — ABNORMAL HIGH (ref 70–99)
Potassium: 5 mmol/L (ref 3.5–5.1)
Sodium: 134 mmol/L — ABNORMAL LOW (ref 135–145)
Total Bilirubin: 1.2 mg/dL (ref 0.3–1.2)
Total Protein: 5.5 g/dL — ABNORMAL LOW (ref 6.5–8.1)

## 2022-10-21 LAB — CBC WITH DIFFERENTIAL/PLATELET
Abs Immature Granulocytes: 0.03 10*3/uL (ref 0.00–0.07)
Basophils Absolute: 0.1 10*3/uL (ref 0.0–0.1)
Basophils Relative: 1 %
Eosinophils Absolute: 0 10*3/uL (ref 0.0–0.5)
Eosinophils Relative: 0 %
HCT: 37.7 % (ref 36.0–46.0)
Hemoglobin: 12.6 g/dL (ref 12.0–15.0)
Immature Granulocytes: 0 %
Lymphocytes Relative: 11 %
Lymphs Abs: 0.8 10*3/uL (ref 0.7–4.0)
MCH: 32.2 pg (ref 26.0–34.0)
MCHC: 33.4 g/dL (ref 30.0–36.0)
MCV: 96.4 fL (ref 80.0–100.0)
Monocytes Absolute: 0.4 10*3/uL (ref 0.1–1.0)
Monocytes Relative: 5 %
Neutro Abs: 5.6 10*3/uL (ref 1.7–7.7)
Neutrophils Relative %: 83 %
Platelets: 232 10*3/uL (ref 150–400)
RBC: 3.91 MIL/uL (ref 3.87–5.11)
RDW: 15.5 % (ref 11.5–15.5)
WBC: 6.8 10*3/uL (ref 4.0–10.5)
nRBC: 0 % (ref 0.0–0.2)

## 2022-10-21 LAB — I-STAT VENOUS BLOOD GAS, ED
Acid-Base Excess: 2 mmol/L (ref 0.0–2.0)
Bicarbonate: 26.4 mmol/L (ref 20.0–28.0)
Calcium, Ion: 1.08 mmol/L — ABNORMAL LOW (ref 1.15–1.40)
HCT: 44 % (ref 36.0–46.0)
Hemoglobin: 15 g/dL (ref 12.0–15.0)
O2 Saturation: 91 %
Potassium: 3.9 mmol/L (ref 3.5–5.1)
Sodium: 135 mmol/L (ref 135–145)
TCO2: 28 mmol/L (ref 22–32)
pCO2, Ven: 37.8 mmHg — ABNORMAL LOW (ref 44–60)
pH, Ven: 7.453 — ABNORMAL HIGH (ref 7.25–7.43)
pO2, Ven: 59 mmHg — ABNORMAL HIGH (ref 32–45)

## 2022-10-21 LAB — GLUCOSE, CAPILLARY: Glucose-Capillary: 95 mg/dL (ref 70–99)

## 2022-10-21 LAB — CBG MONITORING, ED: Glucose-Capillary: 102 mg/dL — ABNORMAL HIGH (ref 70–99)

## 2022-10-21 LAB — D-DIMER, QUANTITATIVE: D-Dimer, Quant: 0.57 ug/mL-FEU — ABNORMAL HIGH (ref 0.00–0.50)

## 2022-10-21 LAB — MAGNESIUM: Magnesium: 2.1 mg/dL (ref 1.7–2.4)

## 2022-10-21 LAB — LIPASE, BLOOD: Lipase: 21 U/L (ref 11–51)

## 2022-10-21 MED ORDER — CLONAZEPAM 0.5 MG PO TABS
0.5000 mg | ORAL_TABLET | Freq: Once | ORAL | Status: AC
  Administered 2022-10-21: 0.5 mg via ORAL
  Filled 2022-10-21: qty 1

## 2022-10-21 MED ORDER — HEPARIN BOLUS VIA INFUSION
1500.0000 [IU] | Freq: Once | INTRAVENOUS | Status: AC
  Administered 2022-10-21: 1500 [IU] via INTRAVENOUS
  Filled 2022-10-21: qty 1500

## 2022-10-21 MED ORDER — LORAZEPAM 2 MG/ML IJ SOLN
0.5000 mg | Freq: Once | INTRAMUSCULAR | Status: AC
  Administered 2022-10-21: 0.5 mg via INTRAVENOUS
  Filled 2022-10-21: qty 1

## 2022-10-21 MED ORDER — ACETAMINOPHEN 500 MG PO TABS
1000.0000 mg | ORAL_TABLET | Freq: Once | ORAL | Status: AC
  Administered 2022-10-21: 1000 mg via ORAL
  Filled 2022-10-21: qty 2

## 2022-10-21 MED ORDER — HEPARIN SODIUM (PORCINE) 5000 UNIT/ML IJ SOLN
5000.0000 [IU] | Freq: Three times a day (TID) | INTRAMUSCULAR | Status: DC
  Administered 2022-10-21: 5000 [IU] via SUBCUTANEOUS
  Filled 2022-10-21: qty 1

## 2022-10-21 MED ORDER — ACETAMINOPHEN 325 MG PO TABS: 650.0000 mg | ORAL_TABLET | Freq: Four times a day (QID) | ORAL | Status: DC | PRN

## 2022-10-21 MED ORDER — INSULIN ASPART 100 UNIT/ML IJ SOLN: 0.0000 [IU] | INTRAMUSCULAR | Status: DC

## 2022-10-21 MED ORDER — LABETALOL HCL 5 MG/ML IV SOLN
5.0000 mg | INTRAVENOUS | Status: DC | PRN
  Administered 2022-10-21: 5 mg via INTRAVENOUS
  Filled 2022-10-21: qty 4

## 2022-10-21 MED ORDER — METOCLOPRAMIDE HCL 5 MG/ML IJ SOLN
10.0000 mg | Freq: Once | INTRAMUSCULAR | Status: AC
  Administered 2022-10-21: 10 mg via INTRAVENOUS
  Filled 2022-10-21: qty 2

## 2022-10-21 MED ORDER — HEPARIN (PORCINE) 25000 UT/250ML-% IV SOLN
1000.0000 [IU]/h | INTRAVENOUS | Status: DC
  Administered 2022-10-21: 1000 [IU]/h via INTRAVENOUS
  Filled 2022-10-21: qty 250

## 2022-10-21 MED ORDER — TRIMETHOBENZAMIDE HCL 100 MG/ML IM SOLN: 200.0000 mg | Freq: Two times a day (BID) | INTRAMUSCULAR | Status: DC | PRN

## 2022-10-21 MED ORDER — SODIUM CHLORIDE 0.9 % IV SOLN: 100.0000 mg | Freq: Every day | INTRAVENOUS | Status: DC

## 2022-10-21 MED ORDER — SODIUM CHLORIDE 0.9 % IV SOLN: 200.0000 mg | Freq: Once | INTRAVENOUS | Status: DC

## 2022-10-21 NOTE — ED Notes (Signed)
Mother Keshawnna Paugh [phone] would like an update asap

## 2022-10-21 NOTE — ED Notes (Signed)
Pt report received from previous nurse. Pt A&O x4, vitals stable, denies needs/complaints. Call bell in reach. No acute distress noted.

## 2022-10-21 NOTE — ED Notes (Signed)
ED TO INPATIENT HANDOFF REPORT  ED Nurse Name and Phone #: Edd Arbour [phone]  S Name/Age/Gender Kathy Lewis 27 y.o. female Room/Bed: 031C/031C  Code Status   Code Status: Prior  Home/SNF/Other Home Patient oriented to: self, place, time, and situation Is this baseline? Yes   Triage Complete: Triage complete  Chief Complaint COVID-19 virus infection [U07.1]  Triage Note 3 days of shortness of breath and nausea. Nausea is chronic. Diagnosed with covid yesterday. Dialysis patient and history heart failure. Had dialysis today, got full treatment (Tu,Th,Sat)   Allergies Allergies  Allergen Reactions   Cantaloupe Extract Allergy Skin Test Itching    Mouth itching     Strawberry Extract Itching    Mouth itching   Citrullus Vulgaris Itching    Mouth itching    Food Itching    All melon - mouth itching   Nsaids Other (See Comments)    Avoid per nephrology     Level of Care/Admitting Diagnosis ED Disposition     ED Disposition  Admit   Condition  --   [place]: [place] [100100]  Level of Care: Telemetry Medical [104]  May place patient in observation at [facility] or [place] if equivalent level of care is available:: Yes  Covid Evaluation: Confirmed COVID Positive  Diagnosis: COVID-19 virus infection [id number]  Admitting Physician: Shela Leff [id number]  Attending Physician: Shela Leff [id number]          B Medical/Surgery History Past Medical History:  Diagnosis Date   Anemia    Anxiety    Bipolar 2 disorder ([place])    Chronic kidney disease    Chronic systolic (congestive) heart failure (HCC)    Depression    DKA (diabetic ketoacidoses)    ESRD on peritoneal dialysis ([place])    HSV infection    on valtrex   Hypokalemia    Leukocytosis    Migraine    Noncompliance with medication regimen    Preeclampsia    Prolonged QT syndrome    Severe anemia    Type 1 diabetes mellitus ([place])    Past  Surgical History:  Procedure Laterality Date   BIOPSY  04/24/2022   Procedure: BIOPSY;  Surgeon: [name], MD;  Location: [place];  Service: Gastroenterology;;   CARDIAC CATHETERIZATION     COLONOSCOPY WITH PROPOFOL N/A 04/24/2022   Procedure: COLONOSCOPY WITH PROPOFOL;  Surgeon: [name], MD;  Location: [place];  Service: Gastroenterology;  Laterality: N/A;   DILATION AND EVACUATION N/A 10/22/2019   Procedure: ULTRASOUND GUIDED DILATATION AND EVACUATION;  Surgeon: [name], MD;  Location: [facility];  Service: Gynecology;  Laterality: N/A;   ESOPHAGOGASTRODUODENOSCOPY (EGD) WITH PROPOFOL N/A 04/24/2022   Procedure: ESOPHAGOGASTRODUODENOSCOPY (EGD) WITH PROPOFOL;  Surgeon: [name], MD;  Location: [place];  Service: Gastroenterology;  Laterality: N/A;   IR FLUORO GUIDE CV LINE RIGHT  09/14/2022   IR US GUIDE VASC ACCESS RIGHT  09/14/2022   peritoneal dialysis catheter insertion     RENAL BIOPSY       A IV Location/Drains/Wounds Patient Lines/Drains/Airways Status     Active Line/Drains/Airways     Name Placement date Placement time Site Days   Peripheral IV 10/21/22 20 G Anterior;Proximal;Right Forearm 10/21/22  1544  Forearm  less than 1   Hemodialysis Catheter Right Internal jugular Double lumen Permanent (Tunneled) 09/14/22  1520  Internal jugular  37            Intake/Output Last  24 hours No intake or output data in the 24 hours ending 10/21/22 1946  Labs/Imaging Results for orders placed or performed during the hospital encounter of 10/21/22 (from the past 48 hour(s))  CBC with Differential/Platelet     Status: None   Collection Time: 10/21/22  3:13 PM  Result Value Ref Range   WBC 6.8 4.0 - 10.5 K/uL   RBC 3.91 3.87 - 5.11 MIL/uL   Hemoglobin 12.6 12.0 - 15.0 g/dL   HCT 37.7 36.0 - 46.0 %   MCV 96.4 80.0 - 100.0 fL   MCH 32.2 26.0 - 34.0 pg   MCHC 33.4 30.0 - 36.0 g/dL   RDW 15.5 11.5 - 15.5 %   Platelets 232  150 - 400 K/uL   nRBC 0.0 0.0 - 0.2 %   Neutrophils Relative % 83 %   Neutro Abs 5.6 1.7 - 7.7 K/uL   Lymphocytes Relative 11 %   Lymphs Abs 0.8 0.7 - 4.0 K/uL   Monocytes Relative 5 %   Monocytes Absolute 0.4 0.1 - 1.0 K/uL   Eosinophils Relative 0 %   Eosinophils Absolute 0.0 0.0 - 0.5 K/uL   Basophils Relative 1 %   Basophils Absolute 0.1 0.0 - 0.1 K/uL   Immature Granulocytes 0 %   Abs Immature Granulocytes 0.03 0.00 - 0.07 K/uL    Comment: Performed at [facility], [address] [place], [place] [address]  Lactic acid, plasma     Status: None   Collection Time: 10/21/22  3:13 PM  Result Value Ref Range   Lactic Acid, Venous 1.5 0.5 - 1.9 mmol/L    Comment: Performed at [facility] [address] [place], [place] [address]  Comprehensive metabolic panel     Status: Abnormal   Collection Time: 10/21/22  3:13 PM  Result Value Ref Range   Sodium 134 (L) 135 - 145 mmol/L   Potassium 5.0 3.5 - 5.1 mmol/L   Chloride 98 98 - 111 mmol/L   CO2 22 22 - 32 mmol/L   Glucose, Bld 108 (H) 70 - 99 mg/dL    Comment: Glucose reference range applies only to samples taken after fasting for at least 8 hours.   BUN 27 (H) 6 - 20 mg/dL   Creatinine, Ser 4.32 (H) 0.44 - 1.00 mg/dL   Calcium 8.4 (L) 8.9 - 10.3 mg/dL   Total Protein 5.5 (L) 6.5 - 8.1 g/dL   Albumin 2.5 (L) 3.5 - 5.0 g/dL   AST 38 15 - 41 U/L   ALT 23 0 - 44 U/L   Alkaline Phosphatase 93 38 - 126 U/L   Total Bilirubin 1.2 0.3 - 1.2 mg/dL   GFR, Estimated 14 (L) >60 mL/min    Comment: (NOTE) Calculated using the CKD-EPI Creatinine Equation (2021)    Anion gap 14 5 - 15    Comment: Performed at [facility], [place] [address], [place], [place] [address]  Lipase, blood     Status: None   Collection Time: 10/21/22  3:13 PM  Result Value Ref Range   Lipase 21 11 - 51 U/L    Comment: HEMOLYSIS AT THIS LEVEL MAY AFFECT RESULT Performed at [facility], [place] [address] [place], [place] [address]    I-Stat venous blood gas, ED     Status: Abnormal   Collection Time: 10/21/22  3:41 PM  Result Value Ref Range   pH, Ven 7.453 (H) 7.25 - 7.43  pCO2, Ven 37.8 (L) 44 - 60 mmHg   pO2, Ven 59 (H) 32 - 45 mmHg   Bicarbonate 26.4 20.0 - 28.0 mmol/L   TCO2 28 22 - 32 mmol/L   O2 Saturation 91 %   Acid-Base Excess 2.0 0.0 - 2.0 mmol/L   Sodium 135 135 - 145 mmol/L   Potassium 3.9 3.5 - 5.1 mmol/L   Calcium, Ion 1.08 (L) 1.15 - 1.40 mmol/L   HCT 44.0 36.0 - 46.0 %   Hemoglobin 15.0 12.0 - 15.0 g/dL   Sample type VENOUS    DG Chest Port 1 View  Result Date: 10/21/2022 CLINICAL DATA:  Shortness of breath EXAM: PORTABLE CHEST 1 VIEW COMPARISON:  09/11/2022 radiograph and prior studies FINDINGS: Enlarged of the cardiopericardial silhouette is again identified. Pulmonary vascular congestion is noted. A RIGHT IJ central venous catheter is present with tip overlying the UPPER RIGHT atrium. There is no evidence of focal airspace disease, pulmonary edema, suspicious pulmonary nodule/mass, pleural effusion, or pneumothorax. No acute bony abnormalities are identified. IMPRESSION: Enlargement of the cardiopericardial silhouette with pulmonary vascular congestion. Electronically Signed   By: Margarette Canada M.D.   On: 10/21/2022 15:58    Pending Labs Unresulted Labs (From admission, onward)     Start     Ordered   10/21/22 1513  Lactic acid, plasma  Now then every 2 hours,   R (with STAT occurrences)      10/21/22 1512            Vitals/Pain Today's Vitals   10/21/22 1810 10/21/22 1813 10/21/22 1815 10/21/22 1926  BP: (!) 135/117  (!) 146/118 (!) 141/118  Pulse: (!) 109  (!) 113 (!) 110  Resp: 15  12 (!) 25  Temp:  98.6 F (37 C)  98.3 F (36.8 C)  TempSrc:  Axillary  Oral  SpO2: 100%  98% 100%  PainSc:    0-No pain    Isolation Precautions No active isolations  Medications Medications  LORazepam (ATIVAN) injection 0.5 mg (0.5 mg Intravenous Given 10/21/22 1548)  acetaminophen  (TYLENOL) tablet 1,000 mg (1,000 mg Oral Given 10/21/22 1547)  clonazePAM (KLONOPIN) tablet 0.5 mg (0.5 mg Oral Given 10/21/22 1837)  metoCLOPramide (REGLAN) injection 10 mg (10 mg Intravenous Given 10/21/22 1854)    Mobility walks     Focused Assessments Cardiac Assessment Handoff:    Lab Results  Component Value Date   TROPONINI <0.03 12/21/2014   No results found for: "DDIMER" Does the Patient currently have chest pain? No   , Neuro Assessment Handoff:  Swallow screen pass? Yes          Neuro Assessment:   Neuro Checks:      Has TPA been given? No If patient is a Neuro Trauma and patient is going to OR before floor call report to [facility] nurse: [phone] or [phone]  , Pulmonary Assessment Handoff:  Lung sounds: L Breath Sounds: Clear R Breath Sounds: Clear O2 Device: Nasal Cannula O2 Flow Rate (L/min): 2 L/min    R Recommendations: See Admitting Provider Note  Report given to:   Additional Notes: N/a

## 2022-10-21 NOTE — Progress Notes (Signed)
ANTICOAGULATION CONSULT NOTE - Initial Consult  Pharmacy Consult for Heparin Indication: R/O PE  Allergies  Allergen Reactions   Cantaloupe Extract Allergy Skin Test Itching    Mouth itching     Strawberry Extract Itching    Mouth itching   Citrullus Vulgaris Itching    Mouth itching    Food Itching    All melon - mouth itching   Nsaids Other (See Comments)    Avoid per nephrology     Patient Measurements:    Vital Signs: Temp: 98.7 F (37.1 C) (02/24 2150) Temp Source: Oral (02/24 2150) BP: 111/92 (02/24 2150) Pulse Rate: 110 (02/24 2150)  Labs: Recent Labs    10/20/22 1900 10/20/22 1930 10/20/22 1930 10/21/22 1513 10/21/22 1541  HGB  --  11.7*   < > 12.6 15.0  HCT  --  38.0  --  37.7 44.0  PLT  --  239  --  232  --   CREATININE 6.06*  --   --  4.32*  --    < > = values in this interval not displayed.    CrCl cannot be calculated (Unknown ideal weight.).   Medical History: Past Medical History:  Diagnosis Date   Anemia    Anxiety    Bipolar 2 disorder (HCC)    Chronic kidney disease    Chronic systolic (congestive) heart failure (HCC)    Depression    DKA (diabetic ketoacidoses)    ESRD on peritoneal dialysis ([place])    HSV infection    on valtrex   Hypokalemia    Leukocytosis    Migraine    Noncompliance with medication regimen    Preeclampsia    Prolonged QT syndrome    Severe anemia    Type 1 diabetes mellitus (HCC)     Medications:  No current facility-administered medications on file prior to encounter.   Current Outpatient Medications on File Prior to Encounter  Medication Sig Dispense Refill   acetaminophen (TYLENOL) 500 MG tablet Take 1,000 mg by mouth as needed for headache (pain).     amitriptyline (ELAVIL) 50 MG tablet Take 1 tablet (50 mg total) by mouth at bedtime. 30 tablet 3   amLODipine (NORVASC) 10 MG tablet Take 1 tablet (10 mg total) by mouth daily. 30 tablet 0   atorvastatin (LIPITOR) 40 MG tablet Take 40 mg by mouth  every evening.     calcium acetate (PHOSLO) 667 MG capsule Take 1,334 mg by mouth 3 (three) times daily with meals.     carvedilol (COREG) 12.5 MG tablet Take 1 tablet (12.5 mg total) by mouth 2 (two) times daily with a meal. 60 tablet 0   clonazePAM (KLONOPIN) 0.5 MG tablet Take 1 tablet (0.5 mg total) by mouth 2 (two) times daily as needed for anxiety. (Patient taking differently: Take 0.5 mg by mouth as needed for anxiety.) 20 tablet 0   hydrALAZINE (APRESOLINE) 50 MG tablet Take 1 tablet (50 mg total) by mouth in the morning and at bedtime. 30 tablet 0   hydrOXYzine (ATARAX) 10 MG tablet Take 1 tablet (10 mg total) by mouth 3 (three) times daily as needed. (Patient taking differently: Take 10 mg by mouth at bedtime.) 90 tablet 3   insulin lispro (HUMALOG) 100 UNIT/ML injection Inject 0.5 Units into the skin See admin instructions. 0.5 units per hour via insulin pump.     isosorbide mononitrate (IMDUR) 30 MG 24 hr tablet Take 1 tablet (30 mg total) by mouth daily. 30 tablet  0   magnesium oxide (MAG-OX) 400 MG tablet Take 800 mg by mouth daily.     methocarbamol (ROBAXIN) 500 MG tablet Take 1 tablet (500 mg total) by mouth 2 (two) times daily as needed for up to 15 doses for muscle spasms. (Patient taking differently: Take 500 mg by mouth as needed for muscle spasms.) 15 tablet 0   metoCLOPramide (REGLAN) 10 MG tablet Take 10 mg by mouth as needed for nausea or vomiting.     Nutritional Supplements (FEEDING SUPPLEMENT, NEPRO CARB STEADY,) LIQD Take 237 mLs by mouth 3 (three) times daily as needed (Supplement). 1000 mL 0   ondansetron (ZOFRAN) 4 MG tablet Take 4 mg by mouth as needed for nausea or vomiting.     pantoprazole (PROTONIX) 40 MG tablet Take 1 tablet (40 mg total) by mouth daily. 30 tablet 0   Vitamin D, Ergocalciferol, (DRISDOL) 1.25 MG (50000 UNIT) CAPS capsule Take 50,000 Units by mouth every Sunday.     [DISCONTINUED] escitalopram (LEXAPRO) 10 MG tablet Take 20 mg by mouth daily.       [DISCONTINUED] furosemide (LASIX) 40 MG tablet Take 1 tablet (40 mg total) by mouth daily. (Patient not taking: Reported on 02/16/2021) 7 tablet 0   [DISCONTINUED] lisinopril (ZESTRIL) 10 MG tablet Take 10 mg by mouth daily.      [DISCONTINUED] spironolactone (ALDACTONE) 25 MG tablet Take 25 mg by mouth daily.        Assessment: 27 y.o. female with SOB, possible PE, for heparin.  Heparin 5000 units SQ given at 2230  Goal of Therapy:  Heparin level 0.3-0.7 units/ml Monitor platelets by anticoagulation protocol: Yes   Plan:  Heparin 1500 units IV bolus, then start heparin 1000 units/hr Check heparin level in 8 hours.    Caryl Pina 10/21/2022,11:04 PM

## 2022-10-21 NOTE — ED Provider Notes (Signed)
[facility] Provider Note   CSN: [id number] Arrival date & time: 10/21/22  1444     History  Chief Complaint  Patient presents with   covid/SOB    Hilal Granda is a 27 y.o. female.  Patient is a 27 year old female with multiple medical problems including end-stage renal disease on dialysis Tuesday Thursday Saturday, recurrent nausea and vomiting, CHF with poor EF, type 1 diabetes on a pump, anemia and anxiety who is presenting today with complaints of worsening shortness of breath.  Patient was seen in the emergency room yesterday by myself and PA Soto.  At that time she was having nausea and vomiting.  Her labs were relatively stable but did find that she was positive for COVID.  The pharmacist reported she was not a candidate for Paxlovid because she was on dialysis.  Findings were discussed with the patient but after antiemetics she was feeling better was able to walk around the department and wanted to go home.  She reports since getting home she did go to dialysis this morning and received full course but this afternoon she just started feeling more short of breath and generally unwell with more nausea and vomiting.  She has kept her pump on but reports she did not bring her device to the emergency room so is not sure if it is working now.  EMS reported she was satting greater than 90% on room air but was tachypneic.  Her blood sugar was greater than 100 when they arrived.  Patient denies any pain anywhere she reports just feeling unwell.  The history is provided by the patient and medical records.       Home Medications Prior to Admission medications   Medication Sig Start Date End Date Taking? Authorizing Provider  acetaminophen (TYLENOL) 500 MG tablet Take 1,000 mg by mouth as needed for headache (pain).    [provider]  amitriptyline (ELAVIL) 50 MG tablet Take 1 tablet (50 mg total) by mouth at bedtime. 08/30/22   Salley Slaughter, NP  amLODipine (NORVASC) 10 MG tablet Take 1 tablet (10 mg total) by mouth daily. 10/09/22 11/08/22  Arrien, Jimmy Picket, MD  atorvastatin (LIPITOR) 40 MG tablet Take 40 mg by mouth every evening.    [provider]  calcium acetate (PHOSLO) 667 MG capsule Take 1,334 mg by mouth 3 (three) times daily with meals. 09/28/22   [provider]  carvedilol (COREG) 12.5 MG tablet Take 1 tablet (12.5 mg total) by mouth 2 (two) times daily with a meal. 10/08/22 11/07/22  Arrien, Jimmy Picket, MD  clonazePAM (KLONOPIN) 0.5 MG tablet Take 1 tablet (0.5 mg total) by mouth 2 (two) times daily as needed for anxiety. Patient taking differently: Take 0.5 mg by mouth as needed for anxiety. 03/16/22   [name], MD  hydrALAZINE (APRESOLINE) 50 MG tablet Take 1 tablet (50 mg total) by mouth in the morning and at bedtime. 10/08/22 10/08/23  Arrien, Jimmy Picket, MD  hydrOXYzine (ATARAX) 10 MG tablet Take 1 tablet (10 mg total) by mouth 3 (three) times daily as needed. Patient taking differently: Take 10 mg by mouth at bedtime. 08/30/22   Eulis Canner E, NP  insulin lispro (HUMALOG) 100 UNIT/ML injection Inject 0.5 Units into the skin See admin instructions. 0.5 units per hour via insulin pump. 01/08/22   [provider]  isosorbide mononitrate (IMDUR) 30 MG 24 hr tablet Take 1 tablet (30 mg total) by mouth daily.  10/09/22 11/08/22  Arrien, Jimmy Picket, MD  magnesium oxide (MAG-OX) 400 MG tablet Take 800 mg by mouth daily.    [provider]  methocarbamol (ROBAXIN) 500 MG tablet Take 1 tablet (500 mg total) by mouth 2 (two) times daily as needed for up to 15 doses for muscle spasms. Patient taking differently: Take 500 mg by mouth as needed for muscle spasms. 09/06/22   Wyvonnia Dusky, MD  metoCLOPramide (REGLAN) 10 MG tablet Take 10 mg by mouth as needed for nausea or vomiting. 08/15/22   [provider]  Nutritional Supplements (FEEDING  SUPPLEMENT, NEPRO CARB STEADY,) LIQD Take 237 mLs by mouth 3 (three) times daily as needed (Supplement). 10/08/22 11/07/22  Arrien, Jimmy Picket, MD  ondansetron (ZOFRAN) 4 MG tablet Take 4 mg by mouth as needed for nausea or vomiting.    [provider]  pantoprazole (PROTONIX) 40 MG tablet Take 1 tablet (40 mg total) by mouth daily. 10/08/22 11/07/22  Arrien, Jimmy Picket, MD  Vitamin D, Ergocalciferol, (DRISDOL) 1.25 MG (50000 UNIT) CAPS capsule Take 50,000 Units by mouth every Sunday.    [provider]  escitalopram (LEXAPRO) 10 MG tablet Take 20 mg by mouth daily.  05/10/20 10/06/20  [provider]  furosemide (LASIX) 40 MG tablet Take 1 tablet (40 mg total) by mouth daily. Patient not taking: Reported on 02/16/2021 10/28/19 02/17/21  [name], MD  lisinopril (ZESTRIL) 10 MG tablet Take 10 mg by mouth daily.  02/23/20 10/06/20  [provider]  spironolactone (ALDACTONE) 25 MG tablet Take 25 mg by mouth daily.  04/20/20 10/06/20  [provider]      Allergies    Cantaloupe extract allergy skin test, Strawberry extract, Citrullus vulgaris, Food, and Nsaids    Review of Systems   Review of Systems  Physical Exam Updated Vital Signs BP (!) 146/118   Pulse (!) 113   Temp 98.6 F (37 C) (Axillary)   Resp 12   SpO2 98%  Physical Exam Vitals and nursing note reviewed.  Constitutional:      General: She is in acute distress.     Appearance: She is well-developed.  HENT:     Head: Normocephalic and atraumatic.     Mouth/Throat:     Mouth: Mucous membranes are dry.  Eyes:     Pupils: Pupils are equal, round, and reactive to light.  Cardiovascular:     Rate and Rhythm: Regular rhythm. Tachycardia present.     Heart sounds: Normal heart sounds. No murmur heard.    No friction rub.  Pulmonary:     Effort: Pulmonary effort is normal. Tachypnea present.     Breath sounds: Normal breath sounds. No wheezing or rales.  Abdominal:     General:  Bowel sounds are normal. There is no distension.     Palpations: Abdomen is soft.     Tenderness: There is no abdominal tenderness. There is no guarding or rebound.     Comments: Peritoneal dialysis catheter without drainage or any abd pain at this time  Musculoskeletal:        General: No tenderness. Normal range of motion.     Right lower leg: No edema.     Left lower leg: No edema.     Comments: No edema  Skin:    General: Skin is dry.     Findings: No rash.     Comments: Hot to the touch  Neurological:     Mental Status: She is alert  and oriented to person, place, and time. Mental status is at baseline.     Cranial Nerves: No cranial nerve deficit.  Psychiatric:        Behavior: Behavior normal.     ED Results / Procedures / Treatments   Labs (all labs ordered are listed, but only abnormal results are displayed) Labs Reviewed  COMPREHENSIVE METABOLIC PANEL - Abnormal; Notable for the following components:      Result Value   Sodium 134 (*)    Glucose, Bld 108 (*)    BUN 27 (*)    Creatinine, Ser 4.32 (*)    Calcium 8.4 (*)    Total Protein 5.5 (*)    Albumin 2.5 (*)    GFR, Estimated 14 (*)    All other components within normal limits  I-STAT VENOUS BLOOD GAS, ED - Abnormal; Notable for the following components:   pH, Ven 7.453 (*)    pCO2, Ven 37.8 (*)    pO2, Ven 59 (*)    Calcium, Ion 1.08 (*)    All other components within normal limits  CBC WITH DIFFERENTIAL/PLATELET  LACTIC ACID, PLASMA  LIPASE, BLOOD  LACTIC ACID, PLASMA    EKG EKG Interpretation  Date/Time:  Saturday October 21 2022 16:20:49 EST Ventricular Rate:  112 PR Interval:  136 QRS Duration: 93 QT Interval:  376 QTC Calculation: 514 R Axis:   143 Text Interpretation: Sinus tachycardia Biatrial enlargement Right axis deviation Borderline repolarization abnormality Prolonged QT interval No significant change since last tracing Confirmed by Blanchie Dessert [id number] on 10/21/2022 4:50:32  PM  Radiology DG Chest Port 1 View  Result Date: 10/21/2022 CLINICAL DATA:  Shortness of breath EXAM: PORTABLE CHEST 1 VIEW COMPARISON:  09/11/2022 radiograph and prior studies FINDINGS: Enlarged of the cardiopericardial silhouette is again identified. Pulmonary vascular congestion is noted. A RIGHT IJ central venous catheter is present with tip overlying the UPPER RIGHT atrium. There is no evidence of focal airspace disease, pulmonary edema, suspicious pulmonary nodule/mass, pleural effusion, or pneumothorax. No acute bony abnormalities are identified. IMPRESSION: Enlargement of the cardiopericardial silhouette with pulmonary vascular congestion. Electronically Signed   By: Margarette Canada M.D.   On: 10/21/2022 15:58    Procedures Procedures    Medications Ordered in ED Medications  LORazepam (ATIVAN) injection 0.5 mg (0.5 mg Intravenous Given 10/21/22 1548)  acetaminophen (TYLENOL) tablet 1,000 mg (1,000 mg Oral Given 10/21/22 1547)  clonazePAM (KLONOPIN) tablet 0.5 mg (0.5 mg Oral Given 10/21/22 1837)    ED Course/ Medical Decision Making/ A&P                             Medical Decision Making Amount and/or Complexity of Data Reviewed External Data Reviewed: notes. Labs: ordered. Decision-making details documented in ED Course. Radiology: ordered and independent interpretation performed. Decision-making details documented in ED Course. ECG/medicine tests: ordered and independent interpretation performed. Decision-making details documented in ED Course.  Risk OTC drugs. Prescription drug management. Decision regarding hospitalization.   Pt with multiple medical problems and comorbidities and presenting today with a complaint that caries a high risk for morbidity and mortality.  Returning today for worsening shortness of breath in the setting of being COVID-positive.  Patient did complete a full course of dialysis today and low suspicion for fluid overload at this time.  She does not  have significant edema present on exam.  She is tachycardic and tachypneic here.  Was reported blood sugar was in  the mid 100s.  Lower suspicion for DKA at this time.  Concern for worsening COVID which is exacerbating her ongoing issues with nausea and vomiting.  Patient at one point started slightly trembling all over but heart rate did not elevate, oxygen saturation was normal and with time she was able to answer questions again.  She does not have any history of seizures based on last hospitalization she had episodes of shaking.  She does not take any antiepileptics and low suspicion that what she displayed today was a seizure.  Suspect patient will need admission for ongoing nausea vomiting, shortness of breath related to COVID. 6:45 PM I independently interpreted patient's labs and there is no evidence of DKA with VBG with normal pH, CBC within normal limits, CMP with findings consistent with end-stage renal disease but anion gap of 14 and blood sugar of 108, lipase within normal limits and lactic acid is normal.  I have independently visualized and interpreted pt's images today.  Chest x-ray shows persistent cardiomegaly with some mild pulmonary vascular congestion.  Patient continues to feel short of breath and have persistent tachycardia.  Given her positive COVID status, return in 24 hours, worsening symptoms will admit for observation.  Patient is also nauseated and has been having a hard time eating or drinking.          Final Clinical Impression(s) / ED Diagnoses Final diagnoses:  COVID  SOB (shortness of breath)  ESRD (end stage renal disease) ([facility])    Rx / DC Orders ED Discharge Orders     None         Blanchie Dessert, MD 10/21/22 1845

## 2022-10-21 NOTE — ED Triage Notes (Signed)
3 days of shortness of breath and nausea. Nausea is chronic. Diagnosed with covid yesterday. Dialysis patient and history heart failure. Had dialysis today, got full treatment (Tu,Th,Sat)

## 2022-10-21 NOTE — H&P (Addendum)
History and Physical    Kathy Lewis [id number] DOB: [birth year] DOA: 10/21/2022  PCP: [facility].  Patient coming from: Home  Chief Complaint: Shortness of breath  HPI: Kathy Lewis is a 27 y.o. female with medical history significant of ESRD on HD TTS, hypertension, hyperlipidemia, chronic systolic CHF, type 1 diabetes on insulin pump, pulmonary hypertension, anxiety, depression, bipolar disorder, anemia.  Recently admitted 2/7-2/11 for intractable nausea and vomiting secondary to diabetic gastroparesis and hypertensive emergency admitted to the ICU for clevidipine infusion.  She had seizure-like activity during this hospitalization felt to be due to hypoglycemia; acute CVA ruled out and EEG negative for seizures.  Seen in the ED yesterday for nausea, vomiting, abdominal pain, and nasal congestion and tested positive for COVID.  ED physician had spoken to pharmacist and she was not a candidate for Paxlovid due to being on dialysis.  She was able to tolerate p.o. intake and discharged.  Patient returns to the ED today complaining of shortness of breath.  She did go for dialysis this morning and received full treatment.  In the ED, patient noted to be tachycardic to the 110s.  Afebrile and not hypotensive.  Not hypoxic, satting 98-100% on room air.  Labs showing no leukocytosis, sodium 134 (chronically low), glucose 108, normal lipase and LFTs, lactic acid normal.  Chest x-ray showing pulmonary vascular congestion and no focal airspace disease. Patient received Tylenol, Klonopin, Ativan, and Reglan in the ED. TR[facility] called to admit for ongoing nausea, vomiting, and shortness of breath related to COVID.   Patient reports 3-day history of nausea, vomiting, and shortness of breath.  She feels short of breath even at rest.  Denies rhinorrhea, cough, or chest pain.  Denies abdominal pain or diarrhea.  Due to vomiting she is not eating but has been able to tolerate fluids and taking her  medications at home.  Patient states her insulin pump is currently off and she does not have supplies to use it.  She received a full dialysis treatment today.  Review of Systems:  Review of Systems  All other systems reviewed and are negative.   Past Medical History:  Diagnosis Date   Anemia    Anxiety    Bipolar 2 disorder (HCC)    Chronic kidney disease    Chronic systolic (congestive) heart failure (HCC)    Depression    DKA (diabetic ketoacidoses)    ESRD on peritoneal dialysis ([place])    HSV infection    on valtrex   Hypokalemia    Leukocytosis    Migraine    Noncompliance with medication regimen    Preeclampsia    Prolonged QT syndrome    Severe anemia    Type 1 diabetes mellitus ([place])     Past Surgical History:  Procedure Laterality Date   BIOPSY  04/24/2022   Procedure: BIOPSY;  Surgeon: [name], MD;  Location: [facility];  Service: Gastroenterology;;   CARDIAC CATHETERIZATION     COLONOSCOPY WITH PROPOFOL N/A 04/24/2022   Procedure: COLONOSCOPY WITH PROPOFOL;  Surgeon: [name], MD;  Location: [facility];  Service: Gastroenterology;  Laterality: N/A;   DILATION AND EVACUATION N/A 10/22/2019   Procedure: ULTRASOUND GUIDED DILATATION AND EVACUATION;  Surgeon: [name], MD;  Location: [facility];  Service: Gynecology;  Laterality: N/A;   ESOPHAGOGASTRODUODENOSCOPY (EGD) WITH PROPOFOL N/A 04/24/2022   Procedure: ESOPHAGOGASTRODUODENOSCOPY (EGD) WITH PROPOFOL;  Surgeon: [name], MD;  Location: [facility];  Service: Gastroenterology;  Laterality: N/A;  IR FLUORO GUIDE CV LINE RIGHT  09/14/2022   IR US GUIDE VASC ACCESS RIGHT  09/14/2022   peritoneal dialysis catheter insertion     RENAL BIOPSY       reports that she has quit smoking. Her smoking use included cigars and cigarettes. She has a 2.00 pack-year smoking history. She has never used smokeless tobacco. She reports that she does not drink alcohol and does not use  drugs.  Allergies  Allergen Reactions   Cantaloupe Extract Allergy Skin Test Itching    Mouth itching     Strawberry Extract Itching    Mouth itching   Citrullus Vulgaris Itching    Mouth itching    Food Itching    All melon - mouth itching   Nsaids Other (See Comments)    Avoid per nephrology     Family History  Adopted: Yes  Problem Relation Age of Onset   Heart disease Neg Hx     Prior to Admission medications   Medication Sig Start Date End Date Taking? Authorizing Provider  acetaminophen (TYLENOL) 500 MG tablet Take 1,000 mg by mouth as needed for headache (pain).    [provider]  amitriptyline (ELAVIL) 50 MG tablet Take 1 tablet (50 mg total) by mouth at bedtime. 08/30/22   Salley Slaughter, NP  amLODipine (NORVASC) 10 MG tablet Take 1 tablet (10 mg total) by mouth daily. 10/09/22 11/08/22  Arrien, Jimmy Picket, MD  atorvastatin (LIPITOR) 40 MG tablet Take 40 mg by mouth every evening.    [provider]  calcium acetate (PHOSLO) 667 MG capsule Take 1,334 mg by mouth 3 (three) times daily with meals. 09/28/22   [provider]  carvedilol (COREG) 12.5 MG tablet Take 1 tablet (12.5 mg total) by mouth 2 (two) times daily with a meal. 10/08/22 11/07/22  Arrien, Jimmy Picket, MD  clonazePAM (KLONOPIN) 0.5 MG tablet Take 1 tablet (0.5 mg total) by mouth 2 (two) times daily as needed for anxiety. Patient taking differently: Take 0.5 mg by mouth as needed for anxiety. 03/16/22   [name], MD  hydrALAZINE (APRESOLINE) 50 MG tablet Take 1 tablet (50 mg total) by mouth in the morning and at bedtime. 10/08/22 10/08/23  Arrien, Jimmy Picket, MD  hydrOXYzine (ATARAX) 10 MG tablet Take 1 tablet (10 mg total) by mouth 3 (three) times daily as needed. Patient taking differently: Take 10 mg by mouth at bedtime. 08/30/22   Eulis Canner E, NP  insulin lispro (HUMALOG) 100 UNIT/ML injection Inject 0.5 Units into the skin See admin instructions. 0.5  units per hour via insulin pump. 01/08/22   [provider]  isosorbide mononitrate (IMDUR) 30 MG 24 hr tablet Take 1 tablet (30 mg total) by mouth daily. 10/09/22 11/08/22  Arrien, Jimmy Picket, MD  magnesium oxide (MAG-OX) 400 MG tablet Take 800 mg by mouth daily.    [provider]  methocarbamol (ROBAXIN) 500 MG tablet Take 1 tablet (500 mg total) by mouth 2 (two) times daily as needed for up to 15 doses for muscle spasms. Patient taking differently: Take 500 mg by mouth as needed for muscle spasms. 09/06/22   Wyvonnia Dusky, MD  metoCLOPramide (REGLAN) 10 MG tablet Take 10 mg by mouth as needed for nausea or vomiting. 08/15/22   [provider]  Nutritional Supplements (FEEDING SUPPLEMENT, NEPRO CARB STEADY,) LIQD Take 237 mLs by mouth 3 (three) times daily as needed (Supplement). 10/08/22 11/07/22  Arrien, Jimmy Picket, MD  ondansetron [facility])  4 MG tablet Take 4 mg by mouth as needed for nausea or vomiting.    [provider]  pantoprazole (PROTONIX) 40 MG tablet Take 1 tablet (40 mg total) by mouth daily. 10/08/22 11/07/22  Arrien, Jimmy Picket, MD  Vitamin D, Ergocalciferol, (DRISDOL) 1.25 MG (50000 UNIT) CAPS capsule Take 50,000 Units by mouth every Sunday.    [provider]  escitalopram (LEXAPRO) 10 MG tablet Take 20 mg by mouth daily.  05/10/20 10/06/20  [provider]  furosemide (LASIX) 40 MG tablet Take 1 tablet (40 mg total) by mouth daily. Patient not taking: Reported on 02/16/2021 10/28/19 02/17/21  [name], MD  lisinopril (ZESTRIL) 10 MG tablet Take 10 mg by mouth daily.  02/23/20 10/06/20  [provider]  spironolactone (ALDACTONE) 25 MG tablet Take 25 mg by mouth daily.  04/20/20 10/06/20  [provider]    Physical Exam: Vitals:   10/21/22 1810 10/21/22 1813 10/21/22 1815 10/21/22 1926  BP: (!) 135/117  (!) 146/118 (!) 141/118  Pulse: (!) 109  (!) 113 (!) 110  Resp: 15  12 (!) 25  Temp:  98.6 F  (37 C)  98.3 F (36.8 C)  TempSrc:  Axillary  Oral  SpO2: 100%  98% 100%    Physical Exam Vitals reviewed.  Constitutional:      General: She is not in acute distress. HENT:     Head: Normocephalic and atraumatic.  Eyes:     Extraocular Movements: Extraocular movements intact.  Cardiovascular:     Rate and Rhythm: Normal rate and regular rhythm.     Pulses: Normal pulses.  Pulmonary:     Effort: Pulmonary effort is normal. No respiratory distress.     Breath sounds: No wheezing or rales.  Abdominal:     General: Bowel sounds are normal. There is no distension.     Palpations: Abdomen is soft.     Tenderness: There is no abdominal tenderness. There is no guarding or rebound.  Musculoskeletal:     Cervical back: Normal range of motion.     Right lower leg: No edema.     Left lower leg: No edema.  Skin:    General: Skin is warm and dry.  Neurological:     General: No focal deficit present.     Mental Status: She is alert and oriented to person, place, and time.     Labs on Admission: I have personally reviewed following labs and imaging studies  CBC: Recent Labs  Lab 10/20/22 1930 10/21/22 1513 10/21/22 1541  WBC 3.3* 6.8  --   NEUTROABS 1.5* 5.6  --   HGB 11.7* 12.6 15.0  HCT 38.0 37.7 44.0  MCV 102.2* 96.4  --   PLT 239 232  --    Basic Metabolic Panel: Recent Labs  Lab 10/20/22 1900 10/21/22 1513 10/21/22 1541  NA 133* 134* 135  K 5.0 5.0 3.9  CL 98 98  --   CO2 19* 22  --   GLUCOSE 234* 108*  --   BUN 51* 27*  --   CREATININE 6.06* 4.32*  --   CALCIUM 8.4* 8.4*  --    GFR: CrCl cannot be calculated (Unknown ideal weight.). Liver Function Tests: Recent Labs  Lab 10/20/22 1900 10/21/22 1513  AST 28 38  ALT 27 23  ALKPHOS 95 93  BILITOT 0.6 1.2  PROT 5.6* 5.5*  ALBUMIN 2.6* 2.5*   Recent Labs  Lab 10/20/22 1900 10/21/22 1513  LIPASE 26  21   No results for input(s): "AMMONIA" in the last 168 hours. Coagulation Profile: No results  for input(s): "INR", "PROTIME" in the last 168 hours. Cardiac Enzymes: No results for input(s): "CKTOTAL", "CKMB", "CKMBINDEX", "TROPONINI" in the last 168 hours. BNP (last 3 results) No results for input(s): "PROBNP" in the last 8760 hours. HbA1C: No results for input(s): "HGBA1C" in the last 72 hours. CBG: Recent Labs  Lab 10/20/22 1719  GLUCAP 242*   Lipid Profile: No results for input(s): "CHOL", "HDL", "LDLCALC", "TRIG", "CHOLHDL", "LDLDIRECT" in the last 72 hours. Thyroid Function Tests: No results for input(s): "TSH", "T4TOTAL", "FREET4", "T3FREE", "THYROIDAB" in the last 72 hours. Anemia Panel: No results for input(s): "VITAMINB12", "FOLATE", "FERRITIN", "TIBC", "IRON", "RETICCTPCT" in the last 72 hours. Urine analysis:    Component Value Date/Time   COLORURINE YELLOW 07/28/2022 1503   APPEARANCEUR CLEAR 07/28/2022 1503   LABSPEC 1.025 07/28/2022 1503   PHURINE 5.5 07/28/2022 1503   GLUCOSEU NEGATIVE 07/28/2022 1503   HGBUR MODERATE (A) 07/28/2022 1503   BILIRUBINUR NEGATIVE 07/28/2022 1503   KETONESUR NEGATIVE 07/28/2022 1503   PROTEINUR >=300 (A) 07/28/2022 1503   UROBILINOGEN 0.2 12/15/2015 1736   NITRITE NEGATIVE 07/28/2022 1503   LEUKOCYTESUR NEGATIVE 07/28/2022 1503    Radiological Exams on Admission: DG Chest Port 1 View  Result Date: 10/21/2022 CLINICAL DATA:  Shortness of breath EXAM: PORTABLE CHEST 1 VIEW COMPARISON:  09/11/2022 radiograph and prior studies FINDINGS: Enlarged of the cardiopericardial silhouette is again identified. Pulmonary vascular congestion is noted. A RIGHT IJ central venous catheter is present with tip overlying the UPPER RIGHT atrium. There is no evidence of focal airspace disease, pulmonary edema, suspicious pulmonary nodule/mass, pleural effusion, or pneumothorax. No acute bony abnormalities are identified. IMPRESSION: Enlargement of the cardiopericardial silhouette with pulmonary vascular congestion. Electronically Signed   By:  Margarette Canada M.D.   On: 10/21/2022 15:58    EKG: Independently reviewed. Sinus tachycardia, QTc 514, nonspecific ST/T wave abnormalities.  No significant change since prior tracing.  Assessment and Plan  COVID-19 viral infection SIRS Patient presenting with 3-day history of ongoing shortness of breath.  She received a full dialysis treatment earlier today.  Not hypoxic in the ED, satting 98-100% on room air.  She was tachypneic and placed on 2 L supplemental oxygen in the ED for comfort. Chest x-ray showing pulmonary vascular congestion and no focal airspace disease.  Patient is slightly tachycardic and tachypneic.  No fever, leukocytosis, or lactic acidosis to suggest sepsis.  Discussed with pharmacist, patient does not qualify for Paxlovid due to end-stage renal disease and does not qualify for remdesivir at this time given no hypoxia or chest x-ray findings concerning for COVID-pneumonia.  No indication for steroids at this time. Stat D-dimer ordered, if elevated, order CT angiogram chest to rule out PE.  Check ferritin, fibrinogen, CRP, and LDH.  Airborne and contact precautions.  Continuous pulse ox, supplemental oxygen as needed.  Addendum/update: D-dimer positive (slightly elevated).  Patient states she still makes some urine.  Will avoid giving IV contrast.  Started IV heparin and obtain VQ scan in the morning.  Will order CT chest without contrast to assess for possible COVID-pneumonia.  Nausea and vomiting Likely multifactorial due to COVID infection and diabetic gastroparesis.  Lipase and LFTs normal.  Abdominal exam benign.  She has not vomited in the ED and able to tolerate fluids.  Continue symptomatic management, avoid QT prolonging antiemetics.  ESRD on HD TTS Received full dialysis treatment today.  Chest  x-ray showing pulmonary vascular congestion.  She is not hypoxic.  Potassium 5.0, bicarb 22.  No indication for urgent dialysis overnight.  Consult nephrology during daytime.   Monitor BMP.  Hypertension Systolic currently in the 0000000 and diastolic in the 0000000.  Pharmacy med rec pending. IV labetalol PRN.   Chronic systolic CHF Chest x-ray showing pulmonary vascular congestion.  Received full dialysis treatment today.  Not hypoxic.  Recent echo done 10/05/2022 showing EF less than 20%, global hypokinesis, grade 3 diastolic dysfunction, mildly reduced RV systolic function, moderately elevated pulmonary artery systolic pressure, mild biatrial enlargement, small pericardial effusion, mild MR, moderate TR. Monitor volume status closely.  Type 1 diabetes Patient states her insulin pump is off and she does not have supplies to use it.  P.o. intake currently poor due to nausea and vomiting.  Glucose 108.  A1c was 8.6 in October 2023.  Patient had seizure-like activity during recent hospitalization due to hypoglycemia.  Keep on very sensitive sliding scale insulin every 4 hours for now.  Consider starting low-dose basal insulin if no hypoglycemia.  QT prolongation Cardiac monitoring, avoid QT prolonging drugs.  Check magnesium level.  Repeat EKG in a.m.  DVT prophylaxis: SQ Heparin Code Status: Full Code (discussed with the patient) Family Communication: No family available at this time. Level of care: Telemetry bed Admission status: It is my clinical opinion that referral for OBSERVATION is reasonable and necessary in this patient based on the above information provided. The aforementioned taken together are felt to place the patient at high risk for further clinical deterioration. However, it is anticipated that the patient may be medically stable for discharge from the hospital within 24 to 48 hours.   Shela Leff MD [facility]  If 7PM-7AM, please contact night-coverage [email or web address]  10/21/2022, 7:45 PM

## 2022-10-21 NOTE — ED Notes (Signed)
Please call RN before bringing to unit

## 2022-10-22 ENCOUNTER — Observation Stay (HOSPITAL_COMMUNITY): Payer: Medicaid Other

## 2022-10-22 DIAGNOSIS — I5022 Chronic systolic (congestive) heart failure: Secondary | ICD-10-CM | POA: Diagnosis not present

## 2022-10-22 DIAGNOSIS — U071 COVID-19: Secondary | ICD-10-CM | POA: Diagnosis not present

## 2022-10-22 DIAGNOSIS — I132 Hypertensive heart and chronic kidney disease with heart failure and with stage 5 chronic kidney disease, or end stage renal disease: Secondary | ICD-10-CM | POA: Diagnosis not present

## 2022-10-22 DIAGNOSIS — N186 End stage renal disease: Secondary | ICD-10-CM | POA: Diagnosis not present

## 2022-10-22 LAB — GLUCOSE, CAPILLARY
Glucose-Capillary: 95 mg/dL (ref 70–99)
Glucose-Capillary: 125 mg/dL — ABNORMAL HIGH (ref 70–99)
Glucose-Capillary: 110 mg/dL — ABNORMAL HIGH (ref 70–99)
Glucose-Capillary: 158 mg/dL — ABNORMAL HIGH (ref 70–99)

## 2022-10-22 LAB — LACTATE DEHYDROGENASE: LDH: 211 U/L — ABNORMAL HIGH (ref 98–192)

## 2022-10-22 LAB — BASIC METABOLIC PANEL
Anion gap: 14 (ref 5–15)
BUN: 35 mg/dL — ABNORMAL HIGH (ref 6–20)
CO2: 21 mmol/L — ABNORMAL LOW (ref 22–32)
Calcium: 8.3 mg/dL — ABNORMAL LOW (ref 8.9–10.3)
Chloride: 98 mmol/L (ref 98–111)
Creatinine, Ser: 5.35 mg/dL — ABNORMAL HIGH (ref 0.44–1.00)
GFR, Estimated: 11 mL/min — ABNORMAL LOW (ref >60)
Glucose, Bld: 126 mg/dL — ABNORMAL HIGH (ref 70–99)
Potassium: 4.4 mmol/L (ref 3.5–5.1)
Sodium: 133 mmol/L — ABNORMAL LOW (ref 135–145)

## 2022-10-22 LAB — C-REACTIVE PROTEIN: CRP: 1.8 mg/dL — ABNORMAL HIGH (ref <1.0)

## 2022-10-22 LAB — FIBRINOGEN: Fibrinogen: 383 mg/dL (ref 210–475)

## 2022-10-22 LAB — HEPARIN LEVEL (UNFRACTIONATED): Heparin Unfractionated: 0.37 IU/mL (ref 0.30–0.70)

## 2022-10-22 LAB — FERRITIN: Ferritin: 859 ng/mL — ABNORMAL HIGH (ref 11–307)

## 2022-10-22 MED ORDER — ATORVASTATIN CALCIUM 40 MG PO TABS: 40.0000 mg | ORAL_TABLET | Freq: Every evening | ORAL | Status: DC

## 2022-10-22 MED ORDER — AMITRIPTYLINE HCL 25 MG PO TABS: 50.0000 mg | ORAL_TABLET | Freq: Every day | ORAL | Status: DC

## 2022-10-22 MED ORDER — CLONAZEPAM 0.5 MG PO TABS: 0.5000 mg | ORAL_TABLET | Freq: Two times a day (BID) | ORAL | Status: DC | PRN

## 2022-10-22 MED ORDER — INSULIN GLARGINE-YFGN 100 UNIT/ML ~~LOC~~ SOLN
8.0000 [IU] | Freq: Every day | SUBCUTANEOUS | Status: DC
  Administered 2022-10-22: 8 [IU] via SUBCUTANEOUS
  Filled 2022-10-22: qty 0.08

## 2022-10-22 MED ORDER — CHLORHEXIDINE GLUCONATE CLOTH 2 % EX PADS
6.0000 | MEDICATED_PAD | Freq: Every day | CUTANEOUS | Status: DC
  Administered 2022-10-22: 6 via TOPICAL

## 2022-10-22 MED ORDER — PANTOPRAZOLE SODIUM 40 MG PO TBEC
40.0000 mg | DELAYED_RELEASE_TABLET | Freq: Every day | ORAL | Status: DC
  Administered 2022-10-22: 40 mg via ORAL
  Filled 2022-10-22: qty 1

## 2022-10-22 MED ORDER — METOCLOPRAMIDE HCL 5 MG/ML IJ SOLN
10.0000 mg | Freq: Three times a day (TID) | INTRAMUSCULAR | Status: DC
  Administered 2022-10-22: 10 mg via INTRAVENOUS
  Filled 2022-10-22: qty 2

## 2022-10-22 MED ORDER — CARVEDILOL 12.5 MG PO TABS: 12.5000 mg | ORAL_TABLET | Freq: Two times a day (BID) | ORAL | Status: DC

## 2022-10-22 MED ORDER — ISOSORBIDE MONONITRATE ER 30 MG PO TB24
30.0000 mg | ORAL_TABLET | Freq: Every day | ORAL | Status: DC
  Administered 2022-10-22: 30 mg via ORAL
  Filled 2022-10-22: qty 1

## 2022-10-22 NOTE — Discharge Summary (Signed)
Physician Discharge Summary   Patient: Kathy Lewis MRN: [id number] DOB: [birth year]  Admit date:     10/21/2022  Discharge date: 10/22/22  Discharge Physician: Elmarie Shiley   PCP: [place].   Recommendations at discharge:   Patient Left AMA.    Discharge Diagnoses: Principal Problem:   COVID-19 virus infection Active Problems:   Nausea and vomiting   Type 1 diabetes (HCC)   Essential hypertension   ESRD on hemodialysis (HCC)   Prolonged QT interval   Chronic systolic CHF (congestive heart failure) ([place])  Resolved Problems:   * No resolved hospital problems. *  Hospital Course: 27 year old with past medical history significant for ESRD on hemodialysis TTS, hypertension, hyperlipidemia, chronic systolic heart failure, type 1 diabetes on insulin pump, pulmonary hypertension, anxiety, depression, bipolar disorder, anemia, recently admitted to 8/7" 2/11 for intractable nausea and vomiting secondary to diabetic gastroparesis and hypertensive emergency admitted to the ICU for clevidipine infusion.  She had seizure-like activity during that hospitalization felt to be secondary to hypoglycemia, acute CVA was ruled out EEG was negative.  She presented to the ED with nausea vomiting abdominal pain, nasal congestion found to be positive for COVID.  She was able to tolerate oral intake and was discharged.  She returned to the ED on 2/24 complaining of shortness of breath.  She did had dialysis the day of admission.  She was found to be tachycardic, not hypoxic, chest x-ray showed pulmonary vascular congestion.   Patient admitted with persistent nausea vomiting and shortness of breath.    Assessment and Plan: 1-COVID-19 viral infection SIRS;  Present with shortness of breath, oxygen sat 98 to 100% on room air.  Chest x-ray showed pulmonary vascular congestion. -Does not qualify for Paxlovid due to ESRD, does not qualify for rhythm CVL due to no hypoxia or chest x-ray  finding consistent for pneumonia. -VQ scan Ordered.  -On heparin drip  Patient left AMA.    Nausea vomiting: in setting covid gastroparesis. Improved. Left AMA On reglan.    ESRD on hemodialysis TTS:  Nephrology consulted, Pulmonary edema on Chest x ray  Hypertension: resume imdur, coreg    Chronic systolic heart failure: Volume manage with HD   Diabetes type 1: resume semglee. Insulin pump not working.    Long QT:           Consultants: nephrology  Procedures performed: None Disposition:  Left AMA Diet recommendation:  Carb modified diet DISCHARGE MEDICATION: Left AMA  Discharge Exam: There were no vitals filed for this visit. Left AMA  Condition at discharge:  left AMA  The results of significant diagnostics from this hospitalization (including imaging, microbiology, ancillary and laboratory) are listed below for reference.   Imaging Studies: CT CHEST WO CONTRAST  Result Date: 10/22/2022 CLINICAL DATA:  Shortness of breath EXAM: CT CHEST WITHOUT CONTRAST TECHNIQUE: Multidetector CT imaging of the chest was performed following the standard protocol without IV contrast. RADIATION DOSE REDUCTION: This exam was performed according to the departmental dose-optimization program which includes automated exposure control, adjustment of the mA and/or kV according to patient size and/or use of iterative reconstruction technique. COMPARISON:  None Available. FINDINGS: Cardiovascular: Advanced cardiomegaly with intermediate sized pericardial effusion. Course and caliber of the aorta are normal. Mediastinum/Nodes: No enlarged mediastinal or axillary lymph nodes. Thyroid gland, trachea, and esophagus demonstrate no significant findings. Lungs/Pleura: Left basilar atelectasis. No pleural effusion or pneumothorax. Upper Abdomen: No acute abnormality. Musculoskeletal: No chest wall mass or suspicious bone lesions identified.  IMPRESSION: Advanced cardiomegaly with intermediate sized  pericardial effusion. Electronically Signed   By: Ulyses Jarred M.D.   On: 10/22/2022 02:01   DG Chest Port 1 View  Result Date: 10/21/2022 CLINICAL DATA:  Shortness of breath EXAM: PORTABLE CHEST 1 VIEW COMPARISON:  09/11/2022 radiograph and prior studies FINDINGS: Enlarged of the cardiopericardial silhouette is again identified. Pulmonary vascular congestion is noted. A RIGHT IJ central venous catheter is present with tip overlying the UPPER RIGHT atrium. There is no evidence of focal airspace disease, pulmonary edema, suspicious pulmonary nodule/mass, pleural effusion, or pneumothorax. No acute bony abnormalities are identified. IMPRESSION: Enlargement of the cardiopericardial silhouette with pulmonary vascular congestion. Electronically Signed   By: Margarette Canada M.D.   On: 10/21/2022 15:58   ECHOCARDIOGRAM COMPLETE  Result Date: 10/05/2022    ECHOCARDIOGRAM REPORT   Patient Name:   Kathy Lewis Date of Exam: 10/05/2022 Medical Rec #:  [id number]     Height:       60.0 in Accession #:    [id number]    Weight:       132.7 lb Date of Birth:  [birth year]     BSA:          1.568 m Patient Age:    27 years      BP:           122/89 mmHg Patient Gender: F             HR:           118 bpm. Exam Location:  Inpatient Procedure: 2D Echo, 3D Echo, Cardiac Doppler and Color Doppler Indications:    ; R01.1 Murmur  History:        Patient has prior history of Echocardiogram examinations, most                 recent 11/23/2021. CHF, Signs/Symptoms:Altered Mental Status;                 Risk Factors:Diabetes, Dyslipidemia and Hypertension. ESRD.                 Pulmonary embolus.  Sonographer:    Roseanna Rainbow RDCS Referring Phys: Karmen Bongo IMPRESSIONS  1. Left ventricular ejection fraction, by estimation, is <20%. The left ventricle has severely decreased function. The left ventricle demonstrates global hypokinesis. The left ventricular internal cavity size was mildly dilated. Left ventricular diastolic parameters are  consistent with Grade III diastolic dysfunction (restrictive). Elevated left atrial pressure.  2. Right ventricular systolic function is moderately reduced. The right ventricular size is normal. There is moderately elevated pulmonary artery systolic pressure.  3. Left atrial size was mildly dilated.  4. Right atrial size was mildly dilated.  5. A small pericardial effusion is present.  6. The mitral valve is normal in structure. Mild mitral valve regurgitation. No evidence of mitral stenosis.  7. Tricuspid valve regurgitation is moderate.  8. The aortic valve is tricuspid. Aortic valve regurgitation is not visualized. No aortic stenosis is present.  9. The inferior vena cava is dilated in size with <50% respiratory variability, suggesting right atrial pressure of 15 mmHg. Comparison(s): No significant change from prior study. FINDINGS  Left Ventricle: Left ventricular ejection fraction, by estimation, is <20%. The left ventricle has severely decreased function. The left ventricle demonstrates global hypokinesis. The left ventricular internal cavity size was mildly dilated. There is no  left ventricular hypertrophy. Left ventricular diastolic parameters are consistent with Grade III diastolic dysfunction (restrictive). Elevated left atrial pressure.  Right Ventricle: The right ventricular size is normal. Right ventricular systolic function is moderately reduced. There is moderately elevated pulmonary artery systolic pressure. The tricuspid regurgitant velocity is 3.21 m/s, and with an assumed right atrial pressure of 15 mmHg, the estimated right ventricular systolic pressure is [id number] mmHg. Left Atrium: Left atrial size was mildly dilated. Right Atrium: Right atrial size was mildly dilated. Pericardium: A small pericardial effusion is present. Mitral Valve: The mitral valve is normal in structure. Mild mitral valve regurgitation. No evidence of mitral valve stenosis. Tricuspid Valve: The tricuspid valve is normal in  structure. Tricuspid valve regurgitation is moderate . No evidence of tricuspid stenosis. Aortic Valve: The aortic valve is tricuspid. Aortic valve regurgitation is not visualized. No aortic stenosis is present. Pulmonic Valve: The pulmonic valve was normal in structure. Pulmonic valve regurgitation is mild to moderate. No evidence of pulmonic stenosis. Aorta: The aortic root is normal in size and structure. Venous: The inferior vena cava is dilated in size with less than 50% respiratory variability, suggesting right atrial pressure of 15 mmHg. IAS/Shunts: No atrial level shunt detected by color flow Doppler. Additional Comments: A venous catheter is visualized.  LEFT VENTRICLE PLAX 2D LVIDd:         5.20 cm      Diastology LVIDs:         4.90 cm      LV e' medial:    5.52 cm/s LV PW:         1.50 cm      LV E/e' medial:  19.9 LV IVS:        0.80 cm      LV e' lateral:   12.50 cm/s LVOT diam:     1.80 cm      LV E/e' lateral: 8.8 LV SV:         22 LV SV Index:   14 LVOT Area:     2.54 cm                              3D Volume EF: LV Volumes (MOD)            3D EF:        20 % LV vol d, MOD A2C: 161.0 ml LV EDV:       155 ml LV vol d, MOD A4C: 142.5 ml LV ESV:       125 ml LV vol s, MOD A2C: 135.0 ml LV SV:        30 ml LV vol s, MOD A4C: 122.5 ml LV SV MOD A2C:     26.0 ml LV SV MOD A4C:     142.5 ml LV SV MOD BP:      23.3 ml RIGHT VENTRICLE            IVC RV S prime:     8.70 cm/s  IVC diam: 2.60 cm TAPSE (M-mode): 1.0 cm LEFT ATRIUM             Index        RIGHT ATRIUM           Index LA diam:        4.20 cm 2.68 cm/m   RA Area:     20.40 cm LA Vol (A2C):   58.9 ml 37.56 ml/m  RA Volume:   58.00 ml  36.99 ml/m LA Vol (A4C):   59.0 ml 37.63 ml/m LA Biplane  Vol: 59.4 ml 37.88 ml/m  AORTIC VALVE             PULMONIC VALVE LVOT Vmax:   69.40 cm/s  PR End Diast Vel: 2.76 msec LVOT Vmean:  46.000 cm/s LVOT VTI:    0.087 m  AORTA Ao Root diam: 2.80 cm MITRAL VALVE                TRICUSPID VALVE MV Area (PHT):  5.68 cm     TR Peak grad:   41.2 mmHg MV Decel Time: 134 msec     TR Vmax:        321.00 cm/s MV E velocity: 110.00 cm/s                             SHUNTS                             Systemic VTI:  0.09 m                             Systemic Diam: 1.80 cm Kirk Ruths MD Electronically signed by Kirk Ruths MD Signature Date/Time: 10/05/2022/12:01:16 PM    Final    MR BRAIN W WO CONTRAST  Result Date: 10/04/2022 CLINICAL DATA:  Initial evaluation for altered mental status. EXAM: MRI HEAD WITHOUT AND WITH CONTRAST TECHNIQUE: Multiplanar, multiecho pulse sequences of the brain and surrounding structures were obtained without and with intravenous contrast. CONTRAST:  25m GADAVIST GADOBUTROL 1 MMOL/ML IV SOLN COMPARISON:  Prior CT from earlier the same day. FINDINGS: Brain: Examination moderately degraded by motion artifact. Cerebral volume within normal limits. Mild hazy FLAIR signal intensity involving the periventricular white matter, nonspecific, but most likely reflecting mild and/or early changes of chronic microvascular ischemic disease. Small remote infarct involving the superior right cerebellum noted (series 7, image 12). No evidence for acute or subacute ischemia. Gray-white matter differentiation maintained. No acute or chronic intracranial blood products. No mass lesion, midline shift or mass effect no hydrocephalus or extra-axial fluid collection. Pituitary gland and suprasellar region. No visible abnormal enhancement. Vascular: Major intracranial vascular flow voids are maintained. Skull and upper cervical spine: Craniocervical junction within normal limits. Bone marrow signal intensity normal. No scalp soft tissue abnormality. Sinuses/Orbits: Globes and orbital soft tissues within normal limits. Paranasal sinuses are largely clear. No mastoid effusion. Other: None. IMPRESSION: 1. No acute intracranial abnormality. 2. Small remote right cerebellar infarct. 3. Mild hazy FLAIR signal intensity  involving the periventricular white matter, nonspecific, but most likely reflecting mild/early changes of chronic microvascular ischemic disease. Electronically Signed   By: BJeannine BogaM.D.   On: 10/04/2022 20:56   EEG adult  Result Date: 10/04/2022 YLora Havens MD     10/04/2022  5:49 PM Patient Name: TIshita LaulettaMRN: [id number] Attending: PLora HavensReferring Physician/Provider: YKarmen Bongo MD Date: 10/04/2022 Duration: 23.26 mins Patient history: 89yoF with ams. EEG to evaluate for seizure Level of alertness: Awake, asleep AEDs during EEG study: Clonazepam Technical aspects: This EEG study was done with scalp electrodes positioned according to the 10-20 International system of electrode placement. Electrical activity was reviewed with band pass filter of 1-'70Hz'$ , sensitivity of 7 uV/mm, display speed of 343msec with a '60Hz'$  notched filter applied as appropriate. EEG data were recorded continuously and digitally stored.  Video monitoring was available and  reviewed as appropriate. Description: The posterior dominant rhythm consists of '9Hz'$  activity of moderate voltage (25-35 uV) seen predominantly in posterior head regions, symmetric and reactive to eye opening and eye closing. Sleep was characterized by vertex waves, sleep spindles (12 to 14 Hz), maximal frontocentral region.  Hyperventilation and photic stimulation were not performed.   IMPRESSION: This study is within normal limits. No seizures or epileptiform discharges were seen throughout the recording. Lora Havens   CT Head Wo Contrast  Result Date: 10/04/2022 CLINICAL DATA:  27 year old female with seizure-like activity, nausea vomiting. Type 1 diabetes. EXAM: CT HEAD WITHOUT CONTRAST TECHNIQUE: Contiguous axial images were obtained from the base of the skull through the vertex without intravenous contrast. RADIATION DOSE REDUCTION: This exam was performed according to the departmental dose-optimization program  which includes automated exposure control, adjustment of the mA and/or kV according to patient size and/or use of iterative reconstruction technique. COMPARISON:  None Available. FINDINGS: Brain: No midline shift, ventriculomegaly, mass effect, evidence of mass lesion, intracranial hemorrhage or evidence of cortically based acute infarction. Small cavum septum pellucidum, normal variant. Small chronic appearing, circumscribed infarct of the right superior cerebellar artery territory (coronal image 23). No cerebral cortical encephalomalacia identified and elsewhere gray-white differentiation is within normal limits. Vascular: Very faint Calcified atherosclerosis at the skull base. Skull: Negative. Sinuses/Orbits: Visualized paranasal sinuses and mastoids are clear. Other: Visualized orbits and scalp soft tissues are within normal limits. IMPRESSION: 1. 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 2. No acute intracranial abnormality. And otherwise negative noncontrast CT appearance of the brain. Electronically Signed   By: Genevie Ann M.D.   On: 10/04/2022 05:08    Microbiology: Results for orders placed or performed during the hospital encounter of 10/20/22  Blood culture (routine x 2)     Status: None (Preliminary result)   Collection Time: 10/20/22  4:35 PM   Specimen: BLOOD RIGHT HAND  Result Value Ref Range Status   Specimen Description BLOOD RIGHT HAND  Final   Special Requests   Final    BOTTLES DRAWN AEROBIC AND ANAEROBIC Blood Culture adequate volume   Culture   Final    NO GROWTH 2 DAYS Performed at [facility], [place] [address] [place], [place] [address]    Report Status PENDING  Incomplete  Resp panel by RT-PCR (RSV, Flu A&B, Covid) Anterior Nasal Swab     Status: Abnormal   Collection Time: 10/20/22  4:41 PM   Specimen: Anterior Nasal Swab  Result Value Ref Range Status   SARS Coronavirus 2 by RT PCR POSITIVE (A) NEGATIVE Final   Influenza A by PCR NEGATIVE NEGATIVE  Final   Influenza B by PCR NEGATIVE NEGATIVE Final    Comment: (NOTE) The Xpert Xpress SARS-CoV-2/FLU/RSV plus assay is intended as an aid in the diagnosis of influenza from Nasopharyngeal swab specimens and should not be used as a sole basis for treatment. Nasal washings and aspirates are unacceptable for Xpert Xpress SARS-CoV-2/FLU/RSV testing.  Fact Sheet for Patients: EntrepreneurPulse.com.au  Fact Sheet for Healthcare Providers: IncredibleEmployment.be  This test is not yet approved or cleared by the [place] FDA and has been authorized for detection and/or diagnosis of SARS-CoV-2 by FDA under an Emergency Use Authorization (EUA). This EUA will remain in effect (meaning this test can be used) for the duration of the COVID-19 declaration under Section 564(b)(1) of the Act, 21 U.S.C. section 360bbb-3(b)(1), unless the authorization is terminated or revoked.     Resp Syncytial Virus  by PCR NEGATIVE NEGATIVE Final    Comment: (NOTE) Fact Sheet for Patients: EntrepreneurPulse.com.au  Fact Sheet for Healthcare Providers: IncredibleEmployment.be  This test is not yet approved or cleared by the [place] FDA and has been authorized for detection and/or diagnosis of SARS-CoV-2 by FDA under an Emergency Use Authorization (EUA). This EUA will remain in effect (meaning this test can be used) for the duration of the COVID-19 declaration under Section 564(b)(1) of the Act, 21 U.S.C. section 360bbb-3(b)(1), unless the authorization is terminated or revoked.  Performed at [facility], [place] [address] [place], [place] [address]   Blood culture (routine x 2)     Status: None (Preliminary result)   Collection Time: 10/20/22  6:37 PM   Specimen: BLOOD LEFT HAND  Result Value Ref Range Status   Specimen Description BLOOD LEFT HAND  Final   Special Requests   Final    BOTTLES DRAWN AEROBIC AND  ANAEROBIC Blood Culture results may not be optimal due to an inadequate volume of blood received in culture bottles   Culture   Final    NO GROWTH 2 DAYS Performed at [facility], [place] [address] [place], [place] [address]    Report Status PENDING  Incomplete    Labs: CBC: Recent Labs  Lab 10/20/22 1930 10/21/22 1513 10/21/22 1541  WBC 3.3* 6.8  --   NEUTROABS 1.5* 5.6  --   HGB 11.7* 12.6 15.0  HCT 38.0 37.7 44.0  MCV 102.2* 96.4  --   PLT 239 232  --    Basic Metabolic Panel: Recent Labs  Lab 10/20/22 1900 10/21/22 1513 10/21/22 1541 10/22/22 0553  NA 133* 134* 135 133*  K 5.0 5.0 3.9 4.4  CL 98 98  --  98  CO2 19* 22  --  21*  GLUCOSE 234* 108*  --  126*  BUN 51* 27*  --  35*  CREATININE 6.06* 4.32*  --  5.35*  CALCIUM 8.4* 8.4*  --  8.3*  MG  --  2.1  --   --    Liver Function Tests: Recent Labs  Lab 10/20/22 1900 10/21/22 1513  AST 28 38  ALT 27 23  ALKPHOS 95 93  BILITOT 0.6 1.2  PROT 5.6* 5.5*  ALBUMIN 2.6* 2.5*   CBG: Recent Labs  Lab 10/21/22 2153 10/22/22 0138 10/22/22 0443 10/22/22 0753 10/22/22 1204  GLUCAP 95 95 125* 110* 158*    Discharge time spent: greater than 30 minutes.  Signed: Elmarie Shiley, MD [facility] 10/22/2022

## 2022-10-22 NOTE — Progress Notes (Signed)
ANTICOAGULATION CONSULT NOTE - Initial Consult  Pharmacy Consult for Heparin Indication: R/O PE  Allergies  Allergen Reactions   Cantaloupe Extract Allergy Skin Test Itching    Mouth itching     Strawberry Extract Itching    Mouth itching   Citrullus Vulgaris Itching    Mouth itching    Food Itching    All melon - mouth itching   Nsaids Other (See Comments)    Avoid per nephrology     Patient Measurements:    Vital Signs: Temp: 98 F (36.7 C) (02/25 0755) Temp Source: Oral (02/25 0755) BP: 129/108 (02/25 0755) Pulse Rate: 100 (02/25 0755)  Labs: Recent Labs    10/20/22 1900 10/20/22 1930 10/20/22 1930 10/21/22 1513 10/21/22 1541 10/22/22 0553  HGB  --  11.7*   < > 12.6 15.0  --   HCT  --  38.0  --  37.7 44.0  --   PLT  --  239  --  232  --   --   CREATININE 6.06*  --   --  4.32*  --  5.35*   < > = values in this interval not displayed.     CrCl cannot be calculated (Unknown ideal weight.).   Medical History: Past Medical History:  Diagnosis Date   Anemia    Anxiety    Bipolar 2 disorder (HCC)    Chronic kidney disease    Chronic systolic (congestive) heart failure (HCC)    Depression    DKA (diabetic ketoacidoses)    ESRD on peritoneal dialysis ([place])    HSV infection    on valtrex   Hypokalemia    Leukocytosis    Migraine    Noncompliance with medication regimen    Preeclampsia    Prolonged QT syndrome    Severe anemia    Type 1 diabetes mellitus (HCC)     Medications:  No current facility-administered medications on file prior to encounter.   Current Outpatient Medications on File Prior to Encounter  Medication Sig Dispense Refill   acetaminophen (TYLENOL) 500 MG tablet Take 1,000 mg by mouth as needed for headache (pain).     amitriptyline (ELAVIL) 50 MG tablet Take 1 tablet (50 mg total) by mouth at bedtime. 30 tablet 3   amLODipine (NORVASC) 10 MG tablet Take 1 tablet (10 mg total) by mouth daily. 30 tablet 0   atorvastatin  (LIPITOR) 40 MG tablet Take 40 mg by mouth every evening.     calcium acetate (PHOSLO) 667 MG capsule Take 1,334 mg by mouth 3 (three) times daily with meals.     carvedilol (COREG) 12.5 MG tablet Take 1 tablet (12.5 mg total) by mouth 2 (two) times daily with a meal. 60 tablet 0   clonazePAM (KLONOPIN) 0.5 MG tablet Take 1 tablet (0.5 mg total) by mouth 2 (two) times daily as needed for anxiety. (Patient taking differently: Take 0.5 mg by mouth as needed for anxiety.) 20 tablet 0   hydrALAZINE (APRESOLINE) 50 MG tablet Take 1 tablet (50 mg total) by mouth in the morning and at bedtime. 30 tablet 0   hydrOXYzine (ATARAX) 10 MG tablet Take 1 tablet (10 mg total) by mouth 3 (three) times daily as needed. (Patient taking differently: Take 10 mg by mouth at bedtime.) 90 tablet 3   insulin lispro (HUMALOG) 100 UNIT/ML injection Inject 0.5 Units into the skin See admin instructions. 0.5 units per hour via insulin pump.     isosorbide mononitrate (IMDUR) 30 MG 24  hr tablet Take 1 tablet (30 mg total) by mouth daily. 30 tablet 0   magnesium oxide (MAG-OX) 400 MG tablet Take 800 mg by mouth daily.     methocarbamol (ROBAXIN) 500 MG tablet Take 1 tablet (500 mg total) by mouth 2 (two) times daily as needed for up to 15 doses for muscle spasms. (Patient taking differently: Take 500 mg by mouth as needed for muscle spasms.) 15 tablet 0   metoCLOPramide (REGLAN) 10 MG tablet Take 10 mg by mouth as needed for nausea or vomiting.     Nutritional Supplements (FEEDING SUPPLEMENT, NEPRO CARB STEADY,) LIQD Take 237 mLs by mouth 3 (three) times daily as needed (Supplement). 1000 mL 0   ondansetron (ZOFRAN) 4 MG tablet Take 4 mg by mouth as needed for nausea or vomiting.     pantoprazole (PROTONIX) 40 MG tablet Take 1 tablet (40 mg total) by mouth daily. 30 tablet 0   Vitamin D, Ergocalciferol, (DRISDOL) 1.25 MG (50000 UNIT) CAPS capsule Take 50,000 Units by mouth every Sunday.     [DISCONTINUED] escitalopram (LEXAPRO) 10  MG tablet Take 20 mg by mouth daily.      [DISCONTINUED] furosemide (LASIX) 40 MG tablet Take 1 tablet (40 mg total) by mouth daily. (Patient not taking: Reported on 02/16/2021) 7 tablet 0   [DISCONTINUED] lisinopril (ZESTRIL) 10 MG tablet Take 10 mg by mouth daily.      [DISCONTINUED] spironolactone (ALDACTONE) 25 MG tablet Take 25 mg by mouth daily.       Assessment: 27 y.o. female with shortness of breath. Pharmacy has been consulted to dose heparin until pulmonary embolism has been ruled out.   Heparin level is therapeutic this morning at 0.37 on 1000 units/hour. CBC stable, no issues with infusion reported, no signs/symptoms of bleeding noted.   Goal of Therapy:  Heparin level 0.3-0.7 units/ml Monitor platelets by anticoagulation protocol: Yes   Plan:  Continue heparin infusion at 1000 units/hour Check 6hr confirmatory heparin level Monitor daily CBC, signs/symptoms of bleeding F/u VQ scan to rule out PE  Louanne Belton, PharmD, [facility] PGY1 Pharmacy Resident 10/22/2022 8:36 AM

## 2022-10-22 NOTE — Progress Notes (Signed)
Called to room because Patient leads off. [place] patient disconnected IV tubing infusing Heparin and attached it to the bed. Reports "want to sign self out" Explained risk of possible pulmonary embolism requiring anticoagulation until VQ Scan performed to rule of PE. Reports "It is taking too long". States she had Kidney transplant evaluation appointment at [facility] that can not be missed. Verbalized understanding of risks and adamant that she still want to leave AMA. Signed the form to chart. Family member Female at the bedside providing her transport. Dr Tyrell Antonio made aware. Left the floor ambulatory, in stable condition, at baseline., Leveda Anna, BSN, RN

## 2022-10-22 NOTE — Progress Notes (Signed)
PROGRESS NOTE    Kathy Lewis  [id number] DOB: September 05, 1995 DOA: 10/21/2022 PCP: [place].   Brief Narrative: 27 year old with past medical history significant for ESRD on hemodialysis TTS, hypertension, hyperlipidemia, chronic systolic heart failure, type 1 diabetes on insulin pump, pulmonary hypertension, anxiety, depression, bipolar disorder, anemia, recently admitted to 8/7" 2/11 for intractable nausea and vomiting secondary to diabetic gastroparesis and hypertensive emergency admitted to the ICU for clevidipine infusion.  She had seizure-like activity during that hospitalization felt to be secondary to hypoglycemia, acute CVA was ruled out EEG was negative.  She presented to the ED with nausea vomiting abdominal pain, nasal congestion found to be positive for COVID.  She was able to tolerate oral intake and was discharged.  She returned to the ED on 2/24 complaining of shortness of breath.  She did had dialysis the day of admission.  She was found to be tachycardic, not hypoxic, chest x-ray showed pulmonary vascular congestion.  Patient admitted with persistent nausea vomiting and shortness of breath.    Assessment & Plan:   Principal Problem:   COVID-19 virus infection Active Problems:   Nausea and vomiting   Type 1 diabetes (HCC)   Essential hypertension   ESRD on hemodialysis (HCC)   Prolonged QT interval   Chronic systolic CHF (congestive heart failure) ([place])  1-COVID-19 viral infection SIRS;  Present with shortness of breath, oxygen sat 98 to 100% on room air.  Chest x-ray showed pulmonary vascular congestion. -Does not qualify for Paxlovid due to ESRD, does not qualify for rhythm CVL due to no hypoxia or chest x-ray finding consistent for pneumonia. -VQ scan Ordered.  -On heparin drip  Patient left AMA.    Nausea vomiting: in setting covid gastroparesis. Improved. Left AMA On reglan.    ESRD on hemodialysis TTS:  Nephrology consulted, Pulmonary  edema on Chest x ray   Hypertension: resume imdur, coreg    Chronic systolic heart failure: Volume manage with HD   Diabetes type 1: resume semglee. Insulin pump not working.    Long QT:      Estimated body mass index is 24.54 kg/m as calculated from the following:   Height as of 10/04/22: 5' (1.524 m).   Weight as of 10/07/22: 57 kg.    Subjective: Breathing better, no further vomiting.   Objective: Vitals:   10/21/22 2349 10/22/22 0019 10/22/22 0437 10/22/22 0755  BP: (!) 142/126 (!) 119/99 (!) 129/106 (!) 129/108  Pulse: (!) 115 88 (!) 102 100  Resp: '20 20 20 20  '$ Temp: 97.8 F (36.6 C) 97.8 F (36.6 C) 98.7 F (37.1 C) 98 F (36.7 C)  TempSrc: Oral Oral Oral Oral  SpO2: 100% 100% 100% 99%    Intake/Output Summary (Last 24 hours) at 10/22/2022 0935 Last data filed at 10/22/2022 0600 Gross per 24 hour  Intake 315.19 ml  Output 0 ml  Net 315.19 ml   There were no vitals filed for this visit.  Examination:  General exam: Appears calm and comfortable  Respiratory system: Clear to auscultation. Respiratory effort normal. Cardiovascular system: S1 & S2 heard, RRR. No JVD, murmurs, rubs, gallops or clicks. No pedal edema. Gastrointestinal system: Abdomen is nondistended, soft and nontender. No organomegaly or masses felt. Normal bowel sounds heard.    Data Reviewed: I have personally reviewed following labs and imaging studies  CBC: Recent Labs  Lab 10/20/22 1930 10/21/22 1513 10/21/22 1541  WBC 3.3* 6.8  --   NEUTROABS 1.5* 5.6  --  HGB 11.7* 12.6 15.0  HCT 38.0 37.7 44.0  MCV 102.2* 96.4  --   PLT 239 232  --    Basic Metabolic Panel: Recent Labs  Lab 10/20/22 1900 10/21/22 1513 10/21/22 1541 10/22/22 0553  NA 133* 134* 135 133*  K 5.0 5.0 3.9 4.4  CL 98 98  --  98  CO2 19* 22  --  21*  GLUCOSE 234* 108*  --  126*  BUN 51* 27*  --  35*  CREATININE 6.06* 4.32*  --  5.35*  CALCIUM 8.4* 8.4*  --  8.3*  MG  --  2.1  --   --    GFR: CrCl  cannot be calculated (Unknown ideal weight.). Liver Function Tests: Recent Labs  Lab 10/20/22 1900 10/21/22 1513  AST 28 38  ALT 27 23  ALKPHOS 95 93  BILITOT 0.6 1.2  PROT 5.6* 5.5*  ALBUMIN 2.6* 2.5*   Recent Labs  Lab 10/20/22 1900 10/21/22 1513  LIPASE 26 21   No results for input(s): "AMMONIA" in the last 168 hours. Coagulation Profile: No results for input(s): "INR", "PROTIME" in the last 168 hours. Cardiac Enzymes: No results for input(s): "CKTOTAL", "CKMB", "CKMBINDEX", "TROPONINI" in the last 168 hours. BNP (last 3 results) No results for input(s): "PROBNP" in the last 8760 hours. HbA1C: No results for input(s): "HGBA1C" in the last 72 hours. CBG: Recent Labs  Lab 10/21/22 2057 10/21/22 2153 10/22/22 0138 10/22/22 0443 10/22/22 0753  GLUCAP 102* 95 95 125* 110*   Lipid Profile: No results for input(s): "CHOL", "HDL", "LDLCALC", "TRIG", "CHOLHDL", "LDLDIRECT" in the last 72 hours. Thyroid Function Tests: No results for input(s): "TSH", "T4TOTAL", "FREET4", "T3FREE", "THYROIDAB" in the last 72 hours. Anemia Panel: Recent Labs    10/22/22 0553  FERRITIN 859*   Sepsis Labs: Recent Labs  Lab 10/21/22 1513  LATICACIDVEN 1.5    Recent Results (from the past 240 hour(s))  Blood culture (routine x 2)     Status: None (Preliminary result)   Collection Time: 10/20/22  4:35 PM   Specimen: BLOOD RIGHT HAND  Result Value Ref Range Status   Specimen Description BLOOD RIGHT HAND  Final   Special Requests   Final    BOTTLES DRAWN AEROBIC AND ANAEROBIC Blood Culture adequate volume   Culture   Final    NO GROWTH 2 DAYS Performed at [facility], [place] [address], [place], [place] [address]    Report Status PENDING  Incomplete  Resp panel by RT-PCR (RSV, Flu A&B, Covid) Anterior Nasal Swab     Status: Abnormal   Collection Time: 10/20/22  4:41 PM   Specimen: Anterior Nasal Swab  Result Value Ref Range Status   SARS Coronavirus 2 by RT PCR POSITIVE (A)  NEGATIVE Final   Influenza A by PCR NEGATIVE NEGATIVE Final   Influenza B by PCR NEGATIVE NEGATIVE Final    Comment: (NOTE) The Xpert Xpress SARS-CoV-2/FLU/RSV plus assay is intended as an aid in the diagnosis of influenza from Nasopharyngeal swab specimens and should not be used as a sole basis for treatment. Nasal washings and aspirates are unacceptable for Xpert Xpress SARS-CoV-2/FLU/RSV testing.  Fact Sheet for Patients: EntrepreneurPulse.com.au  Fact Sheet for Healthcare Providers: IncredibleEmployment.be  This test is not yet approved or cleared by the [place] FDA and has been authorized for detection and/or diagnosis of SARS-CoV-2 by FDA under an Emergency Use Authorization (EUA). This EUA will remain in effect (meaning this test can be used) for  the duration of the COVID-19 declaration under Section 564(b)(1) of the Act, 21 U.S.C. section 360bbb-3(b)(1), unless the authorization is terminated or revoked.     Resp Syncytial Virus by PCR NEGATIVE NEGATIVE Final    Comment: (NOTE) Fact Sheet for Patients: EntrepreneurPulse.com.au  Fact Sheet for Healthcare Providers: IncredibleEmployment.be  This test is not yet approved or cleared by the [place] FDA and has been authorized for detection and/or diagnosis of SARS-CoV-2 by FDA under an Emergency Use Authorization (EUA). This EUA will remain in effect (meaning this test can be used) for the duration of the COVID-19 declaration under Section 564(b)(1) of the Act, 21 U.S.C. section 360bbb-3(b)(1), unless the authorization is terminated or revoked.  Performed at [facility], [place] [address], [place], [place] [address]   Blood culture (routine x 2)     Status: None (Preliminary result)   Collection Time: 10/20/22  6:37 PM   Specimen: BLOOD LEFT HAND  Result Value Ref Range Status   Specimen Description BLOOD LEFT HAND  Final    Special Requests   Final    BOTTLES DRAWN AEROBIC AND ANAEROBIC Blood Culture results may not be optimal due to an inadequate volume of blood received in culture bottles   Culture   Final    NO GROWTH 2 DAYS Performed at [facility], [place] [address] [place], [place] [address]    Report Status PENDING  Incomplete         Radiology Studies: CT CHEST WO CONTRAST  Result Date: 10/22/2022 CLINICAL DATA:  Shortness of breath EXAM: CT CHEST WITHOUT CONTRAST TECHNIQUE: Multidetector CT imaging of the chest was performed following the standard protocol without IV contrast. RADIATION DOSE REDUCTION: This exam was performed according to the departmental dose-optimization program which includes automated exposure control, adjustment of the mA and/or kV according to patient size and/or use of iterative reconstruction technique. COMPARISON:  None Available. FINDINGS: Cardiovascular: Advanced cardiomegaly with intermediate sized pericardial effusion. Course and caliber of the aorta are normal. Mediastinum/Nodes: No enlarged mediastinal or axillary lymph nodes. Thyroid gland, trachea, and esophagus demonstrate no significant findings. Lungs/Pleura: Left basilar atelectasis. No pleural effusion or pneumothorax. Upper Abdomen: No acute abnormality. Musculoskeletal: No chest wall mass or suspicious bone lesions identified. IMPRESSION: Advanced cardiomegaly with intermediate sized pericardial effusion. Electronically Signed   By: Ulyses Jarred M.D.   On: 10/22/2022 02:01   DG Chest Port 1 View  Result Date: 10/21/2022 CLINICAL DATA:  Shortness of breath EXAM: PORTABLE CHEST 1 VIEW COMPARISON:  09/11/2022 radiograph and prior studies FINDINGS: Enlarged of the cardiopericardial silhouette is again identified. Pulmonary vascular congestion is noted. A RIGHT IJ central venous catheter is present with tip overlying the UPPER RIGHT atrium. There is no evidence of focal airspace disease, pulmonary edema, suspicious  pulmonary nodule/mass, pleural effusion, or pneumothorax. No acute bony abnormalities are identified. IMPRESSION: Enlargement of the cardiopericardial silhouette with pulmonary vascular congestion. Electronically Signed   By: Margarette Canada M.D.   On: 10/21/2022 15:58        Scheduled Meds:  Chlorhexidine Gluconate Cloth  6 each Topical Daily   insulin aspart  0-6 Units Subcutaneous Q4H   Continuous Infusions:  heparin 1,000 Units/hr (10/21/22 2345)     LOS: 0 days    Time spent: 35 minutes    Amala Petion A Teale Goodgame, MD [facility]   If 7PM-7AM, please contact night-coverage [email or web address]  10/22/2022, 9:35 AM

## 2022-10-22 NOTE — Progress Notes (Signed)
   10/21/22 2150  Assess: MEWS Score  Temp 98.7 F (37.1 C)  BP (!) 111/92  MAP (mmHg) 99  Pulse Rate (!) 110  ECG Heart Rate (!) 112  Resp 16  Level of Consciousness Alert  SpO2 100 %  O2 Device Nasal Cannula  O2 Flow Rate (L/min) 2 L/min  Assess: MEWS Score  MEWS Temp 0  MEWS Systolic 0  MEWS Pulse 2  MEWS RR 0  MEWS LOC 0  MEWS Score 2  MEWS Score Color Yellow  Assess: if the MEWS score is Yellow or Red  Were vital signs taken at a resting state? Yes  Focused Assessment No change from prior assessment  Does the patient meet 2 or more of the SIRS criteria? Yes  Does the patient have a confirmed or suspected source of infection? No  MEWS guidelines implemented  Yes, yellow  Treat  MEWS Interventions Considered administering scheduled or prn medications/treatments as ordered  Take Vital Signs  Increase Vital Sign Frequency  Yellow: Q2hr x1, continue Q4hrs until patient remains green for 12hrs  Escalate  MEWS: Escalate Yellow: Discuss with charge nurse and consider notifying provider and/or RRT  Notify: Charge Nurse/RN  Name of Charge Nurse/RN Notified Heather  Assess: SIRS CRITERIA  SIRS Temperature  0  SIRS Pulse 1  SIRS Respirations  0  SIRS WBC 0  SIRS Score Sum  1

## 2022-10-22 NOTE — Progress Notes (Signed)
Received pt from ED via stretcher by transporter. Pt is aA&OX4 but sleeps on and off. She is breathing equal and unlabored on oxygen 2L/[place]. POX=98%. Pt in NAD. VS taken, placed on telemetry, and POC reviewed with pt. Pt MAEW but is generally weak. Pt has clean dry and intactPD cath on right lower abd and glucose monitor on left lower abd.

## 2022-10-22 NOTE — Consult Note (Signed)
Renal Service Consult Note [facility]  Kathy Lewis 10/22/2022 [name], MD Requesting Physician: Dr. Tyrell Antonio  Reason for Consult: ESRD pt w/ COVID and SOB HPI: The patient is a 27 y.o. year-old w/ PMH as below who presented to ED yesterday for 3 d hx of SOB and nausea. Was dx'd w/ COVID the day prior on 2/23. She is on HD TTS and had her full session yesterday 2/24. In ED pt was tachy in  110s, afeb w/ normal BP's. SpO2 was 98% on RA, WBC wnl, na 134, LFT's okay. CXR showed vasc congestion , no airspace disease. Pt rec'd klonopin, ativan, reglan in ED and pt was admitted for N/V, SOB felt related to COVID infection.   In the ED pt was able to keep fluids down. Pt seen in ED. She states she is worried about missing her transplant appt at [facility] tomorrow. Denies any SOB at this time.    ROS - denies CP, no joint pain, no HA, no blurry vision, no rash, no diarrhea, no nausea/ vomiting, no dysuria, no difficulty voiding   Past Medical History  Past Medical History:  Diagnosis Date   Anemia    Anxiety    Bipolar 2 disorder (HCC)    Chronic kidney disease    Chronic systolic (congestive) heart failure (HCC)    Depression    DKA (diabetic ketoacidoses)    ESRD on peritoneal dialysis ([place])    HSV infection    on valtrex   Hypokalemia    Leukocytosis    Migraine    Noncompliance with medication regimen    Preeclampsia    Prolonged QT syndrome    Severe anemia    Type 1 diabetes mellitus ([place])    Past Surgical History  Past Surgical History:  Procedure Laterality Date   BIOPSY  04/24/2022   Procedure: BIOPSY;  Surgeon: [name], MD;  Location: [facility];  Service: Gastroenterology;;   CARDIAC CATHETERIZATION     COLONOSCOPY WITH PROPOFOL N/A 04/24/2022   Procedure: COLONOSCOPY WITH PROPOFOL;  Surgeon: [name], MD;  Location: [facility] ENDOSCOPY;  Service: Gastroenterology;  Laterality: N/A;   DILATION AND EVACUATION N/A 10/22/2019    Procedure: ULTRASOUND GUIDED DILATATION AND EVACUATION;  Surgeon: [name], MD;  Location: [facility] LD ORS;  Service: Gynecology;  Laterality: N/A;   ESOPHAGOGASTRODUODENOSCOPY (EGD) WITH PROPOFOL N/A 04/24/2022   Procedure: ESOPHAGOGASTRODUODENOSCOPY (EGD) WITH PROPOFOL;  Surgeon: [name], MD;  Location: [facility];  Service: Gastroenterology;  Laterality: N/A;   IR FLUORO GUIDE CV LINE RIGHT  09/14/2022   IR US GUIDE VASC ACCESS RIGHT  09/14/2022   peritoneal dialysis catheter insertion     RENAL BIOPSY     Family History  Family History  Adopted: Yes  Problem Relation Age of Onset   Heart disease Neg Hx    Social History  reports that she has quit smoking. Her smoking use included cigars and cigarettes. She has a 2.00 pack-year smoking history. She has never used smokeless tobacco. She reports that she does not drink alcohol and does not use drugs. Allergies  Allergies  Allergen Reactions   Cantaloupe Extract Allergy Skin Test Itching    Mouth itching     Strawberry Extract Itching    Mouth itching   Citrullus Vulgaris Itching    Mouth itching    Food Itching    All melon - mouth itching   Nsaids Other (See Comments)    Avoid per nephrology  Home medications Prior to Admission medications   Medication Sig Start Date End Date Taking? Authorizing Provider  acetaminophen (TYLENOL) 500 MG tablet Take 1,000 mg by mouth as needed for headache (pain).    [provider]  amitriptyline (ELAVIL) 50 MG tablet Take 1 tablet (50 mg total) by mouth at bedtime. 08/30/22   Salley Slaughter, NP  amLODipine (NORVASC) 10 MG tablet Take 1 tablet (10 mg total) by mouth daily. 10/09/22 11/08/22  Arrien, Jimmy Picket, MD  atorvastatin (LIPITOR) 40 MG tablet Take 40 mg by mouth every evening.    [provider]  calcium acetate (PHOSLO) 667 MG capsule Take 1,334 mg by mouth 3 (three) times daily with meals. 09/28/22   [provider]  carvedilol  (COREG) 12.5 MG tablet Take 1 tablet (12.5 mg total) by mouth 2 (two) times daily with a meal. 10/08/22 11/07/22  Arrien, Jimmy Picket, MD  clonazePAM (KLONOPIN) 0.5 MG tablet Take 1 tablet (0.5 mg total) by mouth 2 (two) times daily as needed for anxiety. Patient taking differently: Take 0.5 mg by mouth as needed for anxiety. 03/16/22   [name], MD  hydrALAZINE (APRESOLINE) 50 MG tablet Take 1 tablet (50 mg total) by mouth in the morning and at bedtime. 10/08/22 10/08/23  Arrien, Jimmy Picket, MD  hydrOXYzine (ATARAX) 10 MG tablet Take 1 tablet (10 mg total) by mouth 3 (three) times daily as needed. Patient taking differently: Take 10 mg by mouth at bedtime. 08/30/22   Eulis Canner E, NP  insulin lispro (HUMALOG) 100 UNIT/ML injection Inject 0.5 Units into the skin See admin instructions. 0.5 units per hour via insulin pump. 01/08/22   [provider]  isosorbide mononitrate (IMDUR) 30 MG 24 hr tablet Take 1 tablet (30 mg total) by mouth daily. 10/09/22 11/08/22  Arrien, Jimmy Picket, MD  magnesium oxide (MAG-OX) 400 MG tablet Take 800 mg by mouth daily.    [provider]  methocarbamol (ROBAXIN) 500 MG tablet Take 1 tablet (500 mg total) by mouth 2 (two) times daily as needed for up to 15 doses for muscle spasms. Patient taking differently: Take 500 mg by mouth as needed for muscle spasms. 09/06/22   Wyvonnia Dusky, MD  metoCLOPramide (REGLAN) 10 MG tablet Take 10 mg by mouth as needed for nausea or vomiting. 08/15/22   [provider]  Nutritional Supplements (FEEDING SUPPLEMENT, NEPRO CARB STEADY,) LIQD Take 237 mLs by mouth 3 (three) times daily as needed (Supplement). 10/08/22 11/07/22  Arrien, Jimmy Picket, MD  ondansetron (ZOFRAN) 4 MG tablet Take 4 mg by mouth as needed for nausea or vomiting.    [provider]  pantoprazole (PROTONIX) 40 MG tablet Take 1 tablet (40 mg total) by mouth daily. 10/08/22 11/07/22  Arrien, Jimmy Picket, MD   Vitamin D, Ergocalciferol, (DRISDOL) 1.25 MG (50000 UNIT) CAPS capsule Take 50,000 Units by mouth every Sunday.    [provider]  escitalopram (LEXAPRO) 10 MG tablet Take 20 mg by mouth daily.  05/10/20 10/06/20  [provider]  furosemide (LASIX) 40 MG tablet Take 1 tablet (40 mg total) by mouth daily. Patient not taking: Reported on 02/16/2021 10/28/19 02/17/21  [name], MD  lisinopril (ZESTRIL) 10 MG tablet Take 10 mg by mouth daily.  02/23/20 10/06/20  [provider]  spironolactone (ALDACTONE) 25 MG tablet Take 25 mg by mouth daily.  04/20/20 10/06/20  [provider]     Vitals:   10/22/22 0019 10/22/22 [id number] 10/22/22 [id number]  10/22/22 1206  BP: (!) 119/99 (!) 129/106 (!) 129/108 (!) 116/97  Pulse: 88 (!) 102 100 100  Resp: '20 20 20 17  '$ Temp: 97.8 F (36.6 C) 98.7 F (37.1 C) 98 F (36.7 C)   TempSrc: Oral Oral Oral   SpO2: 100% 100% 99% 98%   Exam Gen alert, no distress No rash, cyanosis or gangrene Sclera anicteric, throat clear  No jvd or bruits Chest clear bilat to bases, no rales/ wheezing RRR no MRG Abd soft ntnd no mass or ascites +bs GU defer MS no joint effusions or deformity Ext no LE or UE edema, no wounds or ulcers Neuro is alert, Ox 3 , nf    RIJ TDC intact      OP HD: TTS [facility] - no details available   Assessment/ Plan: COVID infection - w/ N/V which she also had prior to this COVID infection. Per pmd.  SOB - no signs of sig vol overload today. CXR shows mild vasc congestion; on exam lungs are clear and no LE edema.  ESRD - on HD TTS. Has not missed any HD. Last HD yesterday. Would plan on next HD 2/27.  HTN/ volume - as above, cont any home BP lowering meds HFrEF - EF 20% DM type 1 - uses insulin pump at home   Kelly Splinter  MD [facility] 10/22/2022, 3:02 PM  Recent Labs  Lab 10/20/22 1900 10/20/22 1930 10/21/22 1513 10/21/22 1541 10/22/22 0553  HGB  --    < > 12.6 15.0  --   ALBUMIN 2.6*  --  2.5*   --   --   CALCIUM 8.4*  --  8.4*  --  8.3*  CREATININE 6.06*  --  4.32*  --  5.35*  K 5.0  --  5.0 3.9 4.4   < > = values in this interval not displayed.   Inpatient medications:  amitriptyline  50 mg Oral QHS   atorvastatin  40 mg Oral QPM   carvedilol  12.5 mg Oral BID WC   Chlorhexidine Gluconate Cloth  6 each Topical Daily   insulin aspart  0-6 Units Subcutaneous Q4H   insulin glargine-yfgn  8 Units Subcutaneous Daily   isosorbide mononitrate  30 mg Oral Daily   metoCLOPramide (REGLAN) injection  10 mg Intravenous Q8H   pantoprazole  40 mg Oral Daily    heparin Stopped (10/22/22 1337)   acetaminophen, clonazePAM, labetalol, trimethobenzamide

## 2022-10-25 ENCOUNTER — Encounter (HOSPITAL_COMMUNITY): Payer: Medicaid Other | Admitting: Psychiatry

## 2022-10-26 LAB — CULTURE, BLOOD (ROUTINE X 2)
Culture: NO GROWTH
Culture: NO GROWTH
Special Requests: ADEQUATE

## 2022-11-03 ENCOUNTER — Telehealth (HOSPITAL_COMMUNITY): Payer: Self-pay | Admitting: Clinical

## 2022-11-03 ENCOUNTER — Encounter (HOSPITAL_COMMUNITY): Payer: Self-pay

## 2022-11-03 ENCOUNTER — Ambulatory Visit (HOSPITAL_COMMUNITY): Payer: Medicaid Other | Admitting: Clinical

## 2022-11-03 NOTE — Telephone Encounter (Signed)
Therapist sent the client a link for the scheduled video therapy appt. Client did not check in using the link. Therapist attempted to call the client ,client did not answer.

## 2022-11-07 ENCOUNTER — Other Ambulatory Visit: Payer: Self-pay

## 2022-11-07 ENCOUNTER — Emergency Department (HOSPITAL_BASED_OUTPATIENT_CLINIC_OR_DEPARTMENT_OTHER)
Admission: EM | Admit: 2022-11-07 | Discharge: 2022-11-07 | Disposition: A | Discharge status: Home / Self Care | Payer: Medicaid Other | Attending: Emergency Medicine | Admitting: Emergency Medicine

## 2022-11-07 ENCOUNTER — Encounter (HOSPITAL_BASED_OUTPATIENT_CLINIC_OR_DEPARTMENT_OTHER): Payer: Self-pay | Admitting: Emergency Medicine

## 2022-11-07 DIAGNOSIS — Z5321 Procedure and treatment not carried out due to patient leaving prior to being seen by health care provider: Secondary | ICD-10-CM | POA: Diagnosis not present

## 2022-11-07 DIAGNOSIS — K0889 Other specified disorders of teeth and supporting structures: Secondary | ICD-10-CM | POA: Diagnosis not present

## 2022-11-07 NOTE — ED Triage Notes (Addendum)
Pt reports getting a upper left tooth pulled today and now c/o pain in area. States "they didn't give me any pain medication" Last dose of tylenol around 1300 today with no relief.

## 2022-11-14 ENCOUNTER — Emergency Department (HOSPITAL_COMMUNITY)
Admission: EM | Admit: 2022-11-14 | Discharge: 2022-11-14 | Discharge status: Against Medical Advice | Payer: Medicaid Other | Attending: Emergency Medicine | Admitting: Emergency Medicine

## 2022-11-14 ENCOUNTER — Encounter (HOSPITAL_COMMUNITY): Payer: Self-pay

## 2022-11-14 DIAGNOSIS — I5022 Chronic systolic (congestive) heart failure: Secondary | ICD-10-CM | POA: Diagnosis not present

## 2022-11-14 DIAGNOSIS — Z5321 Procedure and treatment not carried out due to patient leaving prior to being seen by health care provider: Secondary | ICD-10-CM | POA: Insufficient documentation

## 2022-11-14 DIAGNOSIS — R1084 Generalized abdominal pain: Secondary | ICD-10-CM

## 2022-11-14 DIAGNOSIS — N186 End stage renal disease: Secondary | ICD-10-CM | POA: Insufficient documentation

## 2022-11-14 DIAGNOSIS — Z79899 Other long term (current) drug therapy: Secondary | ICD-10-CM | POA: Insufficient documentation

## 2022-11-14 DIAGNOSIS — Z992 Dependence on renal dialysis: Secondary | ICD-10-CM | POA: Insufficient documentation

## 2022-11-14 DIAGNOSIS — I132 Hypertensive heart and chronic kidney disease with heart failure and with stage 5 chronic kidney disease, or end stage renal disease: Secondary | ICD-10-CM | POA: Diagnosis not present

## 2022-11-14 DIAGNOSIS — Z794 Long term (current) use of insulin: Secondary | ICD-10-CM | POA: Diagnosis not present

## 2022-11-14 DIAGNOSIS — E1010 Type 1 diabetes mellitus with ketoacidosis without coma: Secondary | ICD-10-CM | POA: Diagnosis not present

## 2022-11-14 LAB — CBC
HCT: 46.8 % — ABNORMAL HIGH (ref 36.0–46.0)
Hemoglobin: 14.9 g/dL (ref 12.0–15.0)
MCH: 32 pg (ref 26.0–34.0)
MCHC: 31.8 g/dL (ref 30.0–36.0)
MCV: 100.6 fL — ABNORMAL HIGH (ref 80.0–100.0)
Platelets: 247 10*3/uL (ref 150–400)
RBC: 4.65 MIL/uL (ref 3.87–5.11)
RDW: 17.9 % — ABNORMAL HIGH (ref 11.5–15.5)
WBC: 4.2 10*3/uL (ref 4.0–10.5)
nRBC: 0.5 % — ABNORMAL HIGH (ref 0.0–0.2)

## 2022-11-14 LAB — COMPREHENSIVE METABOLIC PANEL
ALT: 16 U/L (ref 0–44)
AST: 21 U/L (ref 15–41)
Albumin: 3 g/dL — ABNORMAL LOW (ref 3.5–5.0)
Alkaline Phosphatase: 105 U/L (ref 38–126)
Anion gap: 16 — ABNORMAL HIGH (ref 5–15)
BUN: 39 mg/dL — ABNORMAL HIGH (ref 6–20)
CO2: 20 mmol/L — ABNORMAL LOW (ref 22–32)
Calcium: 9.1 mg/dL (ref 8.9–10.3)
Chloride: 98 mmol/L (ref 98–111)
Creatinine, Ser: 4.51 mg/dL — ABNORMAL HIGH (ref 0.44–1.00)
GFR, Estimated: 13 mL/min — ABNORMAL LOW (ref >60)
Glucose, Bld: 91 mg/dL (ref 70–99)
Potassium: 4.9 mmol/L (ref 3.5–5.1)
Sodium: 134 mmol/L — ABNORMAL LOW (ref 135–145)
Total Bilirubin: 0.7 mg/dL (ref 0.3–1.2)
Total Protein: 6.5 g/dL (ref 6.5–8.1)

## 2022-11-14 LAB — LIPASE, BLOOD: Lipase: 24 U/L (ref 11–51)

## 2022-11-14 LAB — HCG, QUANTITATIVE, PREGNANCY: hCG, Beta Chain, Quant, S: 3 m[IU]/mL (ref <5)

## 2022-11-14 LAB — CBG MONITORING, ED: Glucose-Capillary: 115 mg/dL — ABNORMAL HIGH (ref 70–99)

## 2022-11-14 LAB — BETA-HYDROXYBUTYRIC ACID: Beta-Hydroxybutyric Acid: 0.87 mmol/L — ABNORMAL HIGH (ref 0.05–0.27)

## 2022-11-14 MED ORDER — METOCLOPRAMIDE HCL 5 MG/ML IJ SOLN
10.0000 mg | Freq: Once | INTRAMUSCULAR | Status: AC
  Administered 2022-11-14: 10 mg via INTRAVENOUS
  Filled 2022-11-14: qty 2

## 2022-11-14 MED ORDER — HYDROMORPHONE HCL 1 MG/ML IJ SOLN
0.5000 mg | Freq: Once | INTRAMUSCULAR | Status: AC
  Administered 2022-11-14: 0.5 mg via INTRAVENOUS
  Filled 2022-11-14: qty 1

## 2022-11-14 MED ORDER — LACTATED RINGERS IV BOLUS
1000.0000 mL | Freq: Once | INTRAVENOUS | Status: AC
  Administered 2022-11-14: 1000 mL via INTRAVENOUS

## 2022-11-14 NOTE — ED Provider Triage Note (Cosign Needed)
Emergency Medicine Provider Triage Evaluation Note  Kathy Lewis , a 27 y.o. female  was evaluated in triage.  Pt complains of abd pain.  27 year old female sent in history of poorly controlled type I diabetic, end-stage renal disease currently on hemodialysis, history of intractable nausea and vomiting, bipolar disorder presents ED with complaints of abdominal pain. Endorse nausea and vomiting. No diarrhea ,no fever, chills, cough  Review of Systems  Positive: As above Negative: As above  Physical Exam  BP (!) 140/113 (BP Location: Right Arm)   Pulse (!) 111   Temp 97.7 F (36.5 C) (Oral)   Resp 16   Ht 5' (1.524 m)   Wt 59 kg   SpO2 97%   BMI 25.39 kg/m  Gen:   Awake, no distress   Resp:  Normal effort  MSK:   Moves extremities without difficulty  Other:    Medical Decision Making  Medically screening exam initiated at 5:32 PM.  Appropriate orders placed.  Kathy Lewis was informed that the remainder of the evaluation will be completed by another provider, this initial triage assessment does not replace that evaluation, and the importance of remaining in the ED until their evaluation is complete.     Domenic Moras, PA-C 11/14/22 1733

## 2022-11-14 NOTE — ED Provider Notes (Signed)
[place] Provider Note   CSN: [id number] Arrival date & time: 11/14/22  1715     History  Chief Complaint  Patient presents with   Abdominal Pain    Kathy Lewis is a 27 y.o. female with ESRD on HD, uncontrolled T1DM, HTN, dyslipidemia, bipolar 2, h/o PE, prolonged QT,  presents with abd pain.   Pt complains of abdominal pain, intermittent x 1 week, worsening. Received a full session of her peritoneal dialysis at the center today. Endorse nausea and vomiting. No diarrhea, no fever, chills, cough, vaginal symptoms, urinary symptoms, diarrhea/constipation, melena/hematochezia.  States she has had this type of pain before but they could not determine what it was.  Does not feel like episodes of DKA.   Abdominal Pain      Home Medications Prior to Admission medications   Medication Sig Start Date End Date Taking? Authorizing Provider  acetaminophen (TYLENOL) 500 MG tablet Take 1,000 mg by mouth as needed for headache (pain).    [provider]  amitriptyline (ELAVIL) 50 MG tablet Take 1 tablet (50 mg total) by mouth at bedtime. 08/30/22   Salley Slaughter, NP  amLODipine (NORVASC) 10 MG tablet Take 1 tablet (10 mg total) by mouth daily. 10/09/22 11/08/22  Arrien, Jimmy Picket, MD  atorvastatin (LIPITOR) 40 MG tablet Take 40 mg by mouth every evening.    [provider]  calcium acetate (PHOSLO) 667 MG capsule Take 1,334 mg by mouth 3 (three) times daily with meals. 09/28/22   [provider]  carvedilol (COREG) 12.5 MG tablet Take 1 tablet (12.5 mg total) by mouth 2 (two) times daily with a meal. 10/08/22 11/07/22  Arrien, Jimmy Picket, MD  clonazePAM (KLONOPIN) 0.5 MG tablet Take 1 tablet (0.5 mg total) by mouth 2 (two) times daily as needed for anxiety. Patient taking differently: Take 0.5 mg by mouth as needed for anxiety. 03/16/22   [name], MD  hydrALAZINE (APRESOLINE) 50 MG tablet Take 1 tablet  (50 mg total) by mouth in the morning and at bedtime. 10/08/22 10/08/23  Arrien, Jimmy Picket, MD  hydrOXYzine (ATARAX) 10 MG tablet Take 1 tablet (10 mg total) by mouth 3 (three) times daily as needed. Patient taking differently: Take 10 mg by mouth at bedtime. 08/30/22   Eulis Canner E, NP  insulin lispro (HUMALOG) 100 UNIT/ML injection Inject 0.5 Units into the skin See admin instructions. 0.5 units per hour via insulin pump. 01/08/22   [provider]  isosorbide mononitrate (IMDUR) 30 MG 24 hr tablet Take 1 tablet (30 mg total) by mouth daily. 10/09/22 11/08/22  Arrien, Jimmy Picket, MD  magnesium oxide (MAG-OX) 400 MG tablet Take 800 mg by mouth daily.    [provider]  methocarbamol (ROBAXIN) 500 MG tablet Take 1 tablet (500 mg total) by mouth 2 (two) times daily as needed for up to 15 doses for muscle spasms. Patient taking differently: Take 500 mg by mouth as needed for muscle spasms. 09/06/22   Wyvonnia Dusky, MD  metoCLOPramide (REGLAN) 10 MG tablet Take 10 mg by mouth as needed for nausea or vomiting. 08/15/22   [provider]  ondansetron (ZOFRAN) 4 MG tablet Take 4 mg by mouth as needed for nausea or vomiting.    [provider]  pantoprazole (PROTONIX) 40 MG tablet Take 1 tablet (40 mg total) by mouth daily. 10/08/22 11/07/22  Arrien, Jimmy Picket, MD  Vitamin D, Ergocalciferol, (DRISDOL) 1.25 MG (50000 UNIT)  CAPS capsule Take 50,000 Units by mouth every Sunday.    [provider]  escitalopram (LEXAPRO) 10 MG tablet Take 20 mg by mouth daily.  05/10/20 10/06/20  [provider]  furosemide (LASIX) 40 MG tablet Take 1 tablet (40 mg total) by mouth daily. Patient not taking: Reported on 02/16/2021 10/28/19 02/17/21  [name], MD  lisinopril (ZESTRIL) 10 MG tablet Take 10 mg by mouth daily.  02/23/20 10/06/20  [provider]  spironolactone (ALDACTONE) 25 MG tablet Take 25 mg by mouth daily.  04/20/20 10/06/20  [provider]      Allergies    Cantaloupe extract allergy skin test, Citrullus vulgaris, Food, Nsaids, and Strawberry extract    Review of Systems   Review of Systems  Gastrointestinal:  Positive for abdominal pain.   Review of systems Negative for f/c.  A 10 point review of systems was performed and is negative unless otherwise reported in HPI.  Physical Exam Updated Vital Signs BP (!) 140/113 (BP Location: Right Arm)   Pulse (!) 111   Temp 97.7 F (36.5 C) (Oral)   Resp 16   Ht 5' (1.524 m)   Wt 59 kg   SpO2 97%   BMI 25.39 kg/m  Physical Exam General: Uncomfortable appearing female, lying in bed.  HEENT:Sclera anicteric, MMM, trachea midline.  Cardiology: RRR, no murmurs/rubs/gallops. BL radial and DP pulses equal bilaterally.  Resp: Normal respiratory rate and effort. CTAB, no wheezes, rhonchi, crackles.  Abd: Peritoneal dialysis catheter noted in her right side of her abdomen without any surrounding erythema induration purulent drainage.  No significant fluid wave.  Mild epigastric tenderness as well as diffuse tenderness palpation.  Nondistended. No signs of acute abdomen including rebound tenderness or guarding.  GU: Deferred. MSK: No peripheral edema or signs of trauma. Extremities without deformity or TTP. No cyanosis or clubbing. Skin: warm, dry. No rashes or lesions. Back: No CVA tenderness Neuro: A&Ox4, CNs II-XII grossly intact. MAEs. Sensation grossly intact.  Psych: Normal mood and affect.   ED Results / Procedures / Treatments   Labs (all labs ordered are listed, but only abnormal results are displayed) Labs Reviewed  COMPREHENSIVE METABOLIC PANEL - Abnormal; Notable for the following components:      Result Value   Sodium 134 (*)    CO2 20 (*)    BUN 39 (*)    Creatinine, Ser 4.51 (*)    Albumin 3.0 (*)    GFR, Estimated 13 (*)    Anion gap 16 (*)    All other components within normal limits  CBC - Abnormal; Notable for the following components:    HCT 46.8 (*)    MCV 100.6 (*)    RDW 17.9 (*)    nRBC 0.5 (*)    All other components within normal limits  BETA-HYDROXYBUTYRIC ACID - Abnormal; Notable for the following components:   Beta-Hydroxybutyric Acid 0.87 (*)    All other components within normal limits  CBG MONITORING, ED - Abnormal; Notable for the following components:   Glucose-Capillary 115 (*)    All other components within normal limits  LIPASE, BLOOD  HCG, QUANTITATIVE, PREGNANCY  URINALYSIS, ROUTINE W REFLEX MICROSCOPIC    EKG None  Radiology No results found.  Procedures Procedures    Medications Ordered in ED Medications  lactated ringers bolus 1,000 mL (0 mLs Intravenous Stopped 11/14/22 2127)  metoCLOPramide (REGLAN) injection 10 mg (10 mg Intravenous Given 11/14/22 1947)  HYDROmorphone (DILAUDID) injection 0.5 mg (0.5  mg Intravenous Given 11/14/22 1951)    ED Course/ Medical Decision Making/ A&P                          Medical Decision Making Amount and/or Complexity of Data Reviewed Labs: ordered. Decision-making details documented in ED Course. Radiology: ordered.  Risk Prescription drug management.    This patient presents to the ED for concern of abdominal pain/nausea/vomiting, this involves an extensive number of treatment options, and is a complaint that carries with it a high risk of complications and morbidity.  I considered the following differential and admission for this acute, potentially life threatening condition.   MDM:    For DDX for abdominal pain includes but is not limited to:  Abdominal exam without peritoneal signs. No evidence of acute abdomen at this time.   Consider DKA/HHS in a patient with known uncontrolled type 1 diabetes as well as abdominal pain nausea vomiting.  POC glucose on arrival is 115 mg/dL which patient states is normal for her.  Could still consider euglycemic DKA but less likely at this time.  Consider electrolyte derangements.patient did receive a  full episode of peritoneal dialysis today.  Consider SBP the patient is afebrile. patient also has had history of intractable nausea vomiting that could be due to gastroparesis, she has had negative CT Abdo pelvis in the past and her symptoms improved with antiemetics.  Also consider other causes abdominal pain such as acute hepatobiliary disease (including acute cholecystitis or cholangitis), acute pancreatitis, PUD (including gastric perforation), acute appendicitis, vascular catastrophe, bowel obstruction, viscus perforation, diverticulitis.  Due to her tachycardia on arrival and nausea vomiting likely hypovolemic patient was given a liter fluid bolus on arrival.   Clinical Course as of 11/14/22 2156  Tue Nov 14, 2022  1920 Glucose-Capillary(!): 115 [HN]  2153 HCG, Beta Chain, Quant, S: 3 [HN]  2153 Lipase: 24 [HN]  2153 WBC: 4.2 No leukocytosis [HN]  2153 Creatinine(!): 4.51 On PD [HN]  2153 CO2(!): 20 [HN]  2153 Anion gap(!): 16 [HN]  2153 Beta-Hydroxybutyric Acid(!): 0.87 Elevated anion gap and mildly elevated beta hyodroxybutyrate, though patient w/ glucose 91 [HN]  2154 HCG, Beta Chain, Quant, S: 3 [HN]  2154 Patient was given reglan and was awaiting CT abd pelvis. Was notified by nursing that on reevaluation of the patient she had eloped from the emergency department. [HN]    Clinical Course User Index [HN] Audley Hose, MD    Labs: I Ordered, and personally interpreted labs.  The pertinent results include: Those listed above  Imaging Studies ordered: I ordered imaging studies including CT abdomen pelvis I independently visualized and interpreted imaging. I agree with the radiologist interpretation  Additional history obtained from chart review.   Social Determinants of Health: Patient lives independently   Disposition:  Eloped   Co morbidities that complicate the patient evaluation  Past Medical History:  Diagnosis Date   Anemia    Anxiety    Bipolar 2  disorder ([place])    Chronic kidney disease    Chronic systolic (congestive) heart failure (HCC)    Depression    DKA (diabetic ketoacidoses)    ESRD on peritoneal dialysis ([place])    HSV infection    on valtrex   Hypokalemia    Leukocytosis    Migraine    Noncompliance with medication regimen    Preeclampsia    Prolonged QT syndrome    Severe anemia    Type 1  diabetes mellitus ([place])      Medicines Meds ordered this encounter  Medications   lactated ringers bolus 1,000 mL   metoCLOPramide (REGLAN) injection 10 mg   HYDROmorphone (DILAUDID) injection 0.5 mg    I have reviewed the patients home medicines and have made adjustments as needed  Problem List / ED Course: Problem List Items Addressed This Visit   None Visit Diagnoses     Generalized abdominal pain    -  Primary   Eloped from emergency department                       This note was created using dictation software, which may contain spelling or grammatical errors.    Audley Hose, MD 11/14/22 2156

## 2022-11-14 NOTE — ED Triage Notes (Signed)
Pt BIBA from home. Pt c/o generalized abd pain, N/V x 1 week. Pt received dialysis this morning, received full dialysis . Pt's pain worsened after dialysis. Denies diarrhea Pt took her own zofran at home, states it did not help.

## 2022-11-15 ENCOUNTER — Telehealth (HOSPITAL_COMMUNITY): Payer: Self-pay | Admitting: Cardiology

## 2022-11-15 NOTE — Telephone Encounter (Signed)
Patient called to request a referral to cardiac rehab -will confirm with provider

## 2022-11-16 NOTE — Telephone Encounter (Signed)
Order placed\ Message to patient

## 2022-11-20 ENCOUNTER — Ambulatory Visit (HOSPITAL_COMMUNITY)
Admission: RE | Admit: 2022-11-20 | Discharge: 2022-11-20 | Disposition: A | Discharge status: Home / Self Care | Payer: Medicaid Other | Source: Ambulatory Visit | Attending: Internal Medicine | Admitting: Internal Medicine

## 2022-11-20 ENCOUNTER — Encounter (HOSPITAL_COMMUNITY): Payer: Self-pay | Admitting: Internal Medicine

## 2022-11-20 VITALS — BP 110/70 | HR 114 | Wt 128.4 lb

## 2022-11-20 DIAGNOSIS — E1043 Type 1 diabetes mellitus with diabetic autonomic (poly)neuropathy: Secondary | ICD-10-CM | POA: Diagnosis not present

## 2022-11-20 DIAGNOSIS — Z992 Dependence on renal dialysis: Secondary | ICD-10-CM | POA: Insufficient documentation

## 2022-11-20 DIAGNOSIS — I132 Hypertensive heart and chronic kidney disease with heart failure and with stage 5 chronic kidney disease, or end stage renal disease: Secondary | ICD-10-CM | POA: Diagnosis not present

## 2022-11-20 DIAGNOSIS — E1022 Type 1 diabetes mellitus with diabetic chronic kidney disease: Secondary | ICD-10-CM | POA: Diagnosis not present

## 2022-11-20 DIAGNOSIS — E10649 Type 1 diabetes mellitus with hypoglycemia without coma: Secondary | ICD-10-CM | POA: Insufficient documentation

## 2022-11-20 DIAGNOSIS — I3139 Other pericardial effusion (noninflammatory): Secondary | ICD-10-CM | POA: Diagnosis not present

## 2022-11-20 DIAGNOSIS — Z794 Long term (current) use of insulin: Secondary | ICD-10-CM | POA: Diagnosis not present

## 2022-11-20 DIAGNOSIS — Z8759 Personal history of other complications of pregnancy, childbirth and the puerperium: Secondary | ICD-10-CM | POA: Diagnosis not present

## 2022-11-20 DIAGNOSIS — I5022 Chronic systolic (congestive) heart failure: Secondary | ICD-10-CM | POA: Insufficient documentation

## 2022-11-20 DIAGNOSIS — Z8616 Personal history of COVID-19: Secondary | ICD-10-CM | POA: Insufficient documentation

## 2022-11-20 DIAGNOSIS — N186 End stage renal disease: Secondary | ICD-10-CM | POA: Diagnosis not present

## 2022-11-20 DIAGNOSIS — R112 Nausea with vomiting, unspecified: Secondary | ICD-10-CM | POA: Diagnosis not present

## 2022-11-20 NOTE — Progress Notes (Addendum)
ADVANCED HF CLINIC NOTE  PCP: [place]. Primary Cardiologist: DB  HPI:  27 y/o woman with DM1, ESRD on PD and presumed peripartum CM dating back to 2021 with severe LV dysfunction. Has been followed at [facility] AHF team but work-up complicated by lack of f/o and compliance issues.    Recently admitted to [facility] and underwent RHC with markedly elevated filling pressures and low output. Started on inotropes. Also underwent CVVHD for several days but refused conversion to PD. Left AMA.   Echo 3/23 EF 20% RV moderately reduced   Admitted 1/24 for HF decompensation. PD not working. Switched to HD.  I saw her in clinic 1/24 and referred to [facility] for heart/kidney/pancreas transplant eval. Cancelled appointment due to [place]  Admitted 10/04/22 with intractable n/v and HTN urgency. Head CT with small chronic appearing right superior cerebellar infarct. No acute changes. Developed hypoglycemia and persistent uncontrolled hypertension. Admitted to the ICU for clevidipine infusion. Oral meds adjusted  Echo 10/05/22 EF < 20% RV moderately reduced. Mild MR  Seen in ED 10/22/22 with COVID. Left AMA  Returned to ED 11/14/22 with abdominal pain. Given IV fluids, reglan, and dilaudid. She left ED.   (Had normal gastric emptying study in 6/23)  Today she returns for HF follow up. Feeling ok. Still with nausea. Getting HD 3x/week. (Pulling 2.4L) No problems with hypotension. Can do ADLs slowly. Living with her 15 yo daughter (her sister helps)   ROS: All systems negative except as listed in HPI, PMH and Problem List.  SH:  Social History   Socioeconomic History   Marital status: Single    Spouse name: Not on file   Number of children: 1   Years of education: Not on file   Highest education level: Not on file  Occupational History   Occupation: disabled  Tobacco Use   Smoking status: Former    Packs/day: 1.00    Years: 2.00    Additional pack years: 0.00    Total pack years:  2.00    Types: Cigars, Cigarettes   Smokeless tobacco: Never  Vaping Use   Vaping Use: Never used  Substance and Sexual Activity   Alcohol use: No   Drug use: No   Sexual activity: Yes    Birth control/protection: None  Other Topics Concern   Not on file  Social History Narrative   Not on file   Social Determinants of Health   Financial Resource Strain: Not on file  Food Insecurity: No Food Insecurity (10/04/2022)   Hunger Vital Sign    Worried About Running Out of Food in the Last Year: Never true    Ran Out of Food in the Last Year: Never true  Transportation Needs: No Transportation Needs (10/04/2022)   PRAPARE - Hydrologist (Medical): No    Lack of Transportation (Non-Medical): No  Physical Activity: Not on file  Stress: Not on file  Social Connections: Not on file  Intimate Partner Violence: Not At Risk (10/04/2022)   Humiliation, Afraid, Rape, and Kick questionnaire    Fear of Current or Ex-Partner: No    Emotionally Abused: No    Physically Abused: No    Sexually Abused: No    FH:  Family History  Adopted: Yes  Problem Relation Age of Onset   Heart disease Neg Hx     Past Medical History:  Diagnosis Date   Anemia    Anxiety    Bipolar 2 disorder (  [place])    Chronic kidney disease    Chronic systolic (congestive) heart failure (HCC)    Depression    DKA (diabetic ketoacidoses)    ESRD on peritoneal dialysis ([place])    HSV infection    on valtrex   Hypokalemia    Leukocytosis    Migraine    Noncompliance with medication regimen    Preeclampsia    Prolonged QT syndrome    Severe anemia    Type 1 diabetes mellitus (HCC)     Current Outpatient Medications  Medication Sig Dispense Refill   acetaminophen (TYLENOL) 500 MG tablet Take 1,000 mg by mouth as needed for headache (pain).     amitriptyline (ELAVIL) 50 MG tablet Take 1 tablet (50 mg total) by mouth at bedtime. 30 tablet 3   amLODipine (NORVASC) 10 MG tablet Take 1 tablet  (10 mg total) by mouth daily. 30 tablet 0   atorvastatin (LIPITOR) 40 MG tablet Take 40 mg by mouth every evening.     calcium acetate (PHOSLO) 667 MG capsule Take 1,334 mg by mouth 3 (three) times daily with meals.     carvedilol (COREG) 12.5 MG tablet Take 1 tablet (12.5 mg total) by mouth 2 (two) times daily with a meal. 60 tablet 0   clonazePAM (KLONOPIN) 0.5 MG tablet Take 1 tablet (0.5 mg total) by mouth 2 (two) times daily as needed for anxiety. 20 tablet 0   hydrALAZINE (APRESOLINE) 50 MG tablet Take 1 tablet (50 mg total) by mouth in the morning and at bedtime. 30 tablet 0   hydrOXYzine (ATARAX) 10 MG tablet Take 10 mg by mouth at bedtime.     insulin lispro (HUMALOG) 100 UNIT/ML injection Inject 0.5 Units into the skin See admin instructions. 0.5 units per hour via insulin pump.     isosorbide mononitrate (IMDUR) 30 MG 24 hr tablet Take 1 tablet (30 mg total) by mouth daily. 30 tablet 0   magnesium oxide (MAG-OX) 400 MG tablet Take 800 mg by mouth daily.     metoCLOPramide (REGLAN) 10 MG tablet Take 10 mg by mouth as needed for nausea or vomiting.     ondansetron (ZOFRAN) 4 MG tablet Take 4 mg by mouth as needed for nausea or vomiting.     pantoprazole (PROTONIX) 40 MG tablet Take 1 tablet (40 mg total) by mouth daily. 30 tablet 0   Vitamin D, Ergocalciferol, (DRISDOL) 1.25 MG (50000 UNIT) CAPS capsule Take 50,000 Units by mouth every Sunday.     No current facility-administered medications for this encounter.    Vitals:   11/20/22 1538  BP: 110/70  Pulse: (!) 114  SpO2: 97%  Weight: 58.2 kg (128 lb 6.4 oz)   Wt Readings from Last 3 Encounters:  11/20/22 58.2 kg (128 lb 6.4 oz)  11/14/22 59 kg (130 lb)  11/07/22 59 kg (130 lb)      PHYSICAL EXAM:  General:  Weak appearing. No resp difficulty HEENT: normal Neck: supple. no JVD. Carotids 2+ bilat; no bruits. No lymphadenopathy or thryomegaly appreciated. Cor: PMI nondisplaced. Regular tachy No rubs, gallops or murmurs. +  left chest HD cath Lungs: clear Abdomen: soft, nontender, nondistended. No hepatosplenomegaly. No bruits or masses. Good bowel sounds. Extremities: no cyanosis, clubbing, rash, edema Neuro: alert & orientedx3, cranial nerves grossly intact. moves all 4 extremities w/o difficulty. Affect pleasant   ASSESSMENT & PLAN:  1. 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 [place] 07/29/20:  CO low-normal by TD (  CO/CI) Thermodilution: 4.07/2.59) and normal cardiac output by Fick (CO/CI Fick: 4.7/3.0) ECHOs 21' suggested evidence of possible non-compaction, but MRI 8' showed no evidence of infarction, myocarditis, or infiltrative patterns. TTE on 07/30/20 with LVEF 35-40% but declined to 25-30% around 04/2021 likely in the setting of noncompliance with medical therapies.  - Echo 10/2021: EF of 20%. Small pericardial effusion. Indeterminate diastolic function   - Echo 08/14/22: EF 13%, mod MR, severe TR, PASP ~50, small effusion - RHC at [facility] 12/23: RA mean, PA mean 56, PCWP 39, PA sat 56%, Fick CI 1.7, Thermo CI 1.9 - Echo 10/05/22 EF < 20% RV moderately reduced. Mild MR - Previously followed by AHF team at [facility] but has switched care here - Admitted with volume overload 1/24. PD wasn't working. Transitioned to iHD  - Chronic NYHA III-IIIB. Volume status improved with extra HD sessions.  - BP too low for GDMT - Have d/w with [facility] to arrange outpatient evaluation for heart-kidney-pancreas transplant.Appt rescheduled for 12/04/22. We discussed the transplant w/u in detail including need for tight compliance.    2. End stage renal disease on PD - Volume status stable on HD  - Continue HD - add midodrine as needed    3. DMI - per primary - Hgb A1c 8.6 12/23 - On SSI.   4. HTN - BP stable  5. N/V - suspect DM gastroparesis but emyptying study ok in 6/23 - RHC dis not support low output state   Glori Bickers, MD  3:47 PM

## 2022-11-20 NOTE — Patient Instructions (Signed)
Good to see you today!    No medication changes were made  Your physician recommends that you schedule a follow-up appointment in: 2 months  If you have any questions or concerns before your next appointment please send [place] a message through [place] or call our office at [phone].    TO LEAVE A MESSAGE FOR THE NURSE SELECT OPTION 2, PLEASE LEAVE A MESSAGE INCLUDING: YOUR NAME DATE OF BIRTH CALL BACK NUMBER REASON FOR CALL**this is important as we prioritize the call backs  YOU WILL RECEIVE A CALL BACK THE SAME DAY AS LONG AS YOU CALL BEFORE 4:00 PM  At the [facility], you and your health needs are our priority. As part of our continuing mission to provide you with exceptional heart care, we have created designated Provider Care Teams. These Care Teams include your primary Cardiologist (physician) and Advanced Practice Providers (APPs- Physician Assistants and Nurse Practitioners) who all work together to provide you with the care you need, when you need it.   You may see any of the following providers on your designated Care Team at your next follow up: Dr Glori Bickers Dr Loralie Champagne Dr. Roxana Hires, NP Lyda Jester, [facility], [place] Forestine Na, NP Audry Riles, PharmD   Please be sure to bring in all your medications bottles to every appointment.    Thank you for choosing [facility]

## 2022-11-22 ENCOUNTER — Telehealth (HOSPITAL_COMMUNITY): Payer: Self-pay | Admitting: *Deleted

## 2022-11-22 NOTE — Telephone Encounter (Signed)
Received Referral from Dr. Haroldine Laws for this pt to participate in Cardiac Rehab with the diagnosis of Chronic Systolic heart failure.  Reviewed her medical history and noted that her dialysis days are on the same days for phase II.  Called and left message with Fostine to determine how she feels after the treatment and the time to see if we can coordinate with our class times. Contact information provided. Cherre Huger, BSN Cardiac and Training and development officer

## 2022-11-25 ENCOUNTER — Emergency Department (HOSPITAL_COMMUNITY)
Admission: EM | Admit: 2022-11-25 | Discharge: 2022-11-25 | Disposition: A | Discharge status: Home / Self Care | Payer: Medicaid Other | Attending: Emergency Medicine | Admitting: Emergency Medicine

## 2022-11-25 ENCOUNTER — Encounter (HOSPITAL_COMMUNITY): Payer: Self-pay

## 2022-11-25 DIAGNOSIS — N186 End stage renal disease: Secondary | ICD-10-CM

## 2022-11-25 DIAGNOSIS — E871 Hypo-osmolality and hyponatremia: Secondary | ICD-10-CM | POA: Diagnosis not present

## 2022-11-25 DIAGNOSIS — Z7984 Long term (current) use of oral hypoglycemic drugs: Secondary | ICD-10-CM | POA: Diagnosis not present

## 2022-11-25 DIAGNOSIS — R55 Syncope and collapse: Secondary | ICD-10-CM

## 2022-11-25 DIAGNOSIS — R112 Nausea with vomiting, unspecified: Secondary | ICD-10-CM

## 2022-11-25 DIAGNOSIS — Z992 Dependence on renal dialysis: Secondary | ICD-10-CM | POA: Insufficient documentation

## 2022-11-25 DIAGNOSIS — E1122 Type 2 diabetes mellitus with diabetic chronic kidney disease: Secondary | ICD-10-CM | POA: Insufficient documentation

## 2022-11-25 DIAGNOSIS — Z794 Long term (current) use of insulin: Secondary | ICD-10-CM | POA: Diagnosis not present

## 2022-11-25 DIAGNOSIS — I5020 Unspecified systolic (congestive) heart failure: Secondary | ICD-10-CM | POA: Insufficient documentation

## 2022-11-25 LAB — COMPREHENSIVE METABOLIC PANEL
ALT: 16 U/L (ref 0–44)
AST: 20 U/L (ref 15–41)
Albumin: 2.4 g/dL — ABNORMAL LOW (ref 3.5–5.0)
Alkaline Phosphatase: 89 U/L (ref 38–126)
Anion gap: 15 (ref 5–15)
BUN: 41 mg/dL — ABNORMAL HIGH (ref 6–20)
CO2: 19 mmol/L — ABNORMAL LOW (ref 22–32)
Calcium: 8 mg/dL — ABNORMAL LOW (ref 8.9–10.3)
Chloride: 99 mmol/L (ref 98–111)
Creatinine, Ser: 6.93 mg/dL — ABNORMAL HIGH (ref 0.44–1.00)
GFR, Estimated: 8 mL/min — ABNORMAL LOW (ref >60)
Glucose, Bld: 134 mg/dL — ABNORMAL HIGH (ref 70–99)
Potassium: 4.4 mmol/L (ref 3.5–5.1)
Sodium: 133 mmol/L — ABNORMAL LOW (ref 135–145)
Total Bilirubin: 0.5 mg/dL (ref 0.3–1.2)
Total Protein: 6.1 g/dL — ABNORMAL LOW (ref 6.5–8.1)

## 2022-11-25 LAB — CBC WITH DIFFERENTIAL/PLATELET
Abs Immature Granulocytes: 0.01 10*3/uL (ref 0.00–0.07)
Basophils Absolute: 0.1 10*3/uL (ref 0.0–0.1)
Basophils Relative: 2 %
Eosinophils Absolute: 0.1 10*3/uL (ref 0.0–0.5)
Eosinophils Relative: 3 %
HCT: 42.9 % (ref 36.0–46.0)
Hemoglobin: 13.5 g/dL (ref 12.0–15.0)
Immature Granulocytes: 0 %
Lymphocytes Relative: 46 %
Lymphs Abs: 1.5 10*3/uL (ref 0.7–4.0)
MCH: 30.9 pg (ref 26.0–34.0)
MCHC: 31.5 g/dL (ref 30.0–36.0)
MCV: 98.2 fL (ref 80.0–100.0)
Monocytes Absolute: 0.3 10*3/uL (ref 0.1–1.0)
Monocytes Relative: 9 %
Neutro Abs: 1.3 10*3/uL — ABNORMAL LOW (ref 1.7–7.7)
Neutrophils Relative %: 40 %
Platelets: 269 10*3/uL (ref 150–400)
RBC: 4.37 MIL/uL (ref 3.87–5.11)
RDW: 15.9 % — ABNORMAL HIGH (ref 11.5–15.5)
WBC: 3.2 10*3/uL — ABNORMAL LOW (ref 4.0–10.5)
nRBC: 0 % (ref 0.0–0.2)

## 2022-11-25 LAB — TROPONIN I (HIGH SENSITIVITY)
Troponin I (High Sensitivity): 34 ng/L — ABNORMAL HIGH (ref <18)
Troponin I (High Sensitivity): 26 ng/L — ABNORMAL HIGH (ref <18)

## 2022-11-25 MED ORDER — METOCLOPRAMIDE HCL 5 MG PO TABS: 5.0000 mg | ORAL_TABLET | ORAL | 0 refills | Status: DC | PRN

## 2022-11-25 MED ORDER — ONDANSETRON HCL 4 MG/2ML IJ SOLN: 4.0000 mg | Freq: Once | INTRAMUSCULAR | Status: DC

## 2022-11-25 MED ORDER — METOCLOPRAMIDE HCL 5 MG/ML IJ SOLN
10.0000 mg | Freq: Once | INTRAMUSCULAR | Status: AC
  Administered 2022-11-25: 10 mg via INTRAVENOUS
  Filled 2022-11-25: qty 2

## 2022-11-25 MED ORDER — SODIUM CHLORIDE 0.9 % IV BOLUS
500.0000 mL | Freq: Once | INTRAVENOUS | Status: AC
  Administered 2022-11-25: 500 mL via INTRAVENOUS

## 2022-11-25 NOTE — ED Provider Notes (Signed)
Care of patient assumed from Dr. Roxanne Mins.  This patient presents for nausea, vomiting, and diarrhea.  She did have a syncopal episode while in triage.  Currently, her nausea is improved following Reglan.  A prescription for Reglan has been sent to her pharmacy.  She is currently awaiting a delta troponin.  If delta troponin is reassuring, she can be discharged with GI follow-up. Physical Exam  BP 108/87   Pulse (!) 101   Temp 97.9 F (36.6 C) (Oral)   Resp 11   SpO2 98%   Physical Exam Vitals and nursing note reviewed.  Constitutional:      General: She is not in acute distress.    Appearance: Normal appearance. She is well-developed. She is not ill-appearing, toxic-appearing or diaphoretic.  HENT:     Head: Normocephalic and atraumatic.     Right Ear: External ear normal.     Left Ear: External ear normal.     Nose: Nose normal.     Mouth/Throat:     Mouth: Mucous membranes are moist.  Eyes:     General: No scleral icterus.    Extraocular Movements: Extraocular movements intact.     Conjunctiva/sclera: Conjunctivae normal.  Cardiovascular:     Rate and Rhythm: Normal rate and regular rhythm.  Pulmonary:     Effort: Pulmonary effort is normal. No respiratory distress.  Abdominal:     General: There is no distension.     Palpations: Abdomen is soft.  Musculoskeletal:        General: No swelling. Normal range of motion.     Cervical back: Normal range of motion and neck supple.  Skin:    General: Skin is warm and dry.     Coloration: Skin is not jaundiced or pale.  Neurological:     General: No focal deficit present.     Mental Status: She is alert and oriented to person, place, and time.  Psychiatric:        Mood and Affect: Mood normal.        Behavior: Behavior normal.        Thought Content: Thought content normal.        Judgment: Judgment normal.     Procedures  Procedures  ED Course / MDM    Medical Decision Making Amount and/or Complexity of Data  Reviewed Labs: ordered.  Risk Prescription drug management.   On assessment, patient resting comfortably in bed.  She reports resolution of nausea and states that she has been able to eat and drink.  Delta troponin is reassuring.  Given her improvement on Reglan and history of diabetes which led to ESRD, gastroparesis is suspected.  She was prescribed Reglan and advised to follow-up with her GI doctor.  She was discharged in stable condition.       Godfrey Pick, MD 11/25/22 [phone]

## 2022-11-25 NOTE — ED Provider Notes (Signed)
[facility] Provider Note   CSN: [id number] Arrival date & time: 11/25/22  [id number]     History  Chief Complaint  Patient presents with   Nausea    Kathy Lewis is a 27 y.o. female.  The history is provided by the patient and a parent.  She has history of diabetes, systolic heart failure, bipolar disorder, prolonged QT syndrome, end-stage renal disease on peritoneal dialysis and comes in because of vomiting and diarrhea.  She states she is not sure how long she has been having vomiting and diarrhea, but states it has been a long time.  Mother states that it has been going on about a week.  She states that her emesis is not really a major component, but she is having 4-5 watery bowel movements a day.  She denies any blood in the stool or emesis.  She denies any abdominal pain.  She has been feeling weak and lightheaded.  In triage, she had a syncopal episode.   Home Medications Prior to Admission medications   Medication Sig Start Date End Date Taking? Authorizing Provider  acetaminophen (TYLENOL) 500 MG tablet Take 1,000 mg by mouth as needed for headache (pain).    [provider]  amitriptyline (ELAVIL) 50 MG tablet Take 1 tablet (50 mg total) by mouth at bedtime. 08/30/22   Salley Slaughter, NP  amLODipine (NORVASC) 10 MG tablet Take 1 tablet (10 mg total) by mouth daily. 10/09/22 11/20/23  Arrien, Jimmy Picket, MD  atorvastatin (LIPITOR) 40 MG tablet Take 40 mg by mouth every evening.    [provider]  calcium acetate (PHOSLO) 667 MG capsule Take 1,334 mg by mouth 3 (three) times daily with meals. 09/28/22   [provider]  carvedilol (COREG) 12.5 MG tablet Take 1 tablet (12.5 mg total) by mouth 2 (two) times daily with a meal. 10/08/22 11/20/23  Arrien, Jimmy Picket, MD  clonazePAM (KLONOPIN) 0.5 MG tablet Take 1 tablet (0.5 mg total) by mouth 2 (two) times daily as needed for anxiety. 03/16/22   [name], MD  hydrALAZINE (APRESOLINE) 50 MG tablet Take 1 tablet (50 mg total) by mouth in the morning and at bedtime. 10/08/22 10/08/23  Arrien, Jimmy Picket, MD  hydrOXYzine (ATARAX) 10 MG tablet Take 10 mg by mouth at bedtime.    [provider]  insulin lispro (HUMALOG) 100 UNIT/ML injection Inject 0.5 Units into the skin See admin instructions. 0.5 units per hour via insulin pump. 01/08/22   [provider]  isosorbide mononitrate (IMDUR) 30 MG 24 hr tablet Take 1 tablet (30 mg total) by mouth daily. 10/09/22 11/20/23  Arrien, Jimmy Picket, MD  magnesium oxide (MAG-OX) 400 MG tablet Take 800 mg by mouth daily.    [provider]  metoCLOPramide (REGLAN) 10 MG tablet Take 10 mg by mouth as needed for nausea or vomiting. 08/15/22   [provider]  ondansetron (ZOFRAN) 4 MG tablet Take 4 mg by mouth as needed for nausea or vomiting.    [provider]  pantoprazole (PROTONIX) 40 MG tablet Take 1 tablet (40 mg total) by mouth daily. 10/08/22 11/20/23  Arrien, Jimmy Picket, MD  Vitamin D, Ergocalciferol, (DRISDOL) 1.25 MG (50000 UNIT) CAPS capsule Take 50,000 Units by mouth every Sunday.    [provider]  escitalopram (LEXAPRO) 10 MG tablet Take 20 mg by mouth daily.  05/10/20 10/06/20  [provider]  furosemide (LASIX) 40 MG tablet Take 1  tablet (40 mg total) by mouth daily. Patient not taking: Reported on 02/16/2021 10/28/19 02/17/21  [name], MD  lisinopril (ZESTRIL) 10 MG tablet Take 10 mg by mouth daily.  02/23/20 10/06/20  [provider]  spironolactone (ALDACTONE) 25 MG tablet Take 25 mg by mouth daily.  04/20/20 10/06/20  [provider]      Allergies    Cantaloupe extract allergy skin test, Citrullus vulgaris, Food, Nsaids, and Strawberry extract    Review of Systems   Review of Systems  All other systems reviewed and are negative.   Physical Exam Updated Vital Signs Pulse 88   Resp 16   SpO2 97%   Physical Exam Vitals and nursing note reviewed.   27 year old female, resting comfortably and in no acute distress. Vital signs are normal. Oxygen saturation is 97%, which is normal. Head is normocephalic and atraumatic. PERRLA, EOMI. Oropharynx is clear. Neck is nontender and supple without adenopathy or JVD. Back is nontender and there is no CVA tenderness. Lungs are clear without rales, wheezes, or rhonchi. Chest is nontender. Heart has regular rate and rhythm without murmur. Abdomen is soft, flat, nontender.  Peritoneal dialysis catheter is present in the right upper quadrant. Extremities have trace edema, full range of motion is present. Skin is warm and dry without rash. Neurologic: Mental status is normal, cranial nerves are intact, moves all extremities equally.  ED Results / Procedures / Treatments   Labs (all labs ordered are listed, but only abnormal results are displayed) Labs Reviewed  COMPREHENSIVE METABOLIC PANEL - Abnormal; Notable for the following components:      Result Value   Sodium 133 (*)    CO2 19 (*)    Glucose, Bld 134 (*)    BUN 41 (*)    Creatinine, Ser 6.93 (*)    Calcium 8.0 (*)    Total Protein 6.1 (*)    Albumin 2.4 (*)    GFR, Estimated 8 (*)    All other components within normal limits  CBC WITH DIFFERENTIAL/PLATELET - Abnormal; Notable for the following components:   WBC 3.2 (*)    RDW 15.9 (*)    Neutro Abs 1.3 (*)    All other components within normal limits  TROPONIN I (HIGH SENSITIVITY) - Abnormal; Notable for the following components:   Troponin I (High Sensitivity) 34 (*)    All other components within normal limits  TROPONIN I (HIGH SENSITIVITY)    EKG EKG Interpretation  Date/Time:  Saturday November 25 2022 05:35:02 EDT Ventricular Rate:  105 PR Interval:  138 QRS Duration: 100 QT Interval:  368 QTC Calculation: 487 R Axis:   -66 Text Interpretation: Sinus tachycardia LAE, consider biatrial enlargement LAD, consider left  anterior fascicular block Low voltage, extremity and precordial leads Nonspecific T abnormalities, lateral leads Borderline prolonged QT interval When compared with ECG of 10/22/2022, QT has shortened Confirmed by Delora Fuel ([id number]) on 11/25/2022 5:49:45 AM  Radiology No results found.  Procedures Procedures  Cardiac monitor shows normal sinus rhythm, per my interpretation.  Medications Ordered in ED Medications  sodium chloride 0.9 % bolus 500 mL (has no administration in time range)  metoCLOPramide (REGLAN) injection 10 mg (has no administration in time range)    ED Course/ Medical Decision Making/ A&P                             Medical Decision Making Amount and/or Complexity of Data  Reviewed Labs: ordered.  Risk Prescription drug management.   Syncope in the setting of vomiting and diarrhea, consider dehydration, electrolyte disturbance.  Also, consider arrhythmia, ACS.  Vomiting diarrhea which could represent viral gastroenteritis.  Consider gastroparesis with diabetic diarrhea.  I have reviewed her past records, and she was admitted to the hospital on 10/04/2022 with intractable nausea and vomiting, also ED visits for nausea and vomiting on 10/20/2022, and generalized abdominal pain with vomiting on 11/14/2022.  I have ordered IV fluids, IV metoclopramide, will check orthostatic vital signs.  I have reviewed and interpreted her electrocardiogram, and my interpretation is sinus tachycardia, low voltage, nonspecific T wave changes, borderline prolonged QTc interval.  However, when compared with recent ECG, QTc interval has decreased.  Orthostatic vital signs showed no significant change in blood pressure or heart rate.  Patient feels much better after above-noted treatment.  I have reviewed and interpreted her laboratory tests, and my interpretation is mild hyponatremia which is not felt to be clinically significant, elevated BUN and creatinine consistent with known history of renal  failure, mild leukopenia which is not felt to be clinically significant, normal troponin.  I have requested an oral fluid challenge, she will be held in the emergency department until repeat troponin is done to be sure this was not an occult cardiac event.  Case is signed out to Dr. Doren Custard.  If troponin is negative, will discharge with prescription for metoclopramide and advised to use over-the-counter loperamide for diarrhea.  Final Clinical Impression(s) / ED Diagnoses Final diagnoses:  Nausea vomiting and diarrhea  Syncope, unspecified syncope type  Hyponatremia  End-stage renal disease on peritoneal dialysis [facility])    Rx / DC Orders ED Discharge Orders          Ordered    metoCLOPramide (REGLAN) 5 MG tablet  As needed        11/25/22 [id number]              Delora Fuel, MD [id number] [phone]

## 2022-11-25 NOTE — Discharge Instructions (Addendum)
Take loperamide (Imodium AD) as needed for diarrhea.  A prescription for Reglan was sent to your pharmacy.  This is a medication to treat nausea.  Take as needed.  Follow-up with your GI doctor soon as possible.  Return the emergency department for any new or worsening symptoms of concern.

## 2022-11-25 NOTE — ED Triage Notes (Signed)
Pt expresses having nausea and vomiting in which no one can figure out what is causing it, family member reports this most recent episode of nausea and vomiting has lasted for around a week. Pt synopsized when entering the ER front entrance with no injury to self and no fall. Currently is able to communicate and answer all questions.

## 2022-11-26 ENCOUNTER — Emergency Department (HOSPITAL_BASED_OUTPATIENT_CLINIC_OR_DEPARTMENT_OTHER)
Admission: EM | Admit: 2022-11-26 | Discharge: 2022-11-27 | Disposition: A | Discharge status: Home / Self Care | Payer: Medicaid Other | Attending: Emergency Medicine | Admitting: Emergency Medicine

## 2022-11-26 ENCOUNTER — Other Ambulatory Visit: Payer: Self-pay

## 2022-11-26 ENCOUNTER — Encounter (HOSPITAL_BASED_OUTPATIENT_CLINIC_OR_DEPARTMENT_OTHER): Payer: Self-pay | Admitting: Emergency Medicine

## 2022-11-26 DIAGNOSIS — I509 Heart failure, unspecified: Secondary | ICD-10-CM | POA: Diagnosis not present

## 2022-11-26 DIAGNOSIS — N186 End stage renal disease: Secondary | ICD-10-CM | POA: Diagnosis not present

## 2022-11-26 DIAGNOSIS — Z992 Dependence on renal dialysis: Secondary | ICD-10-CM | POA: Diagnosis not present

## 2022-11-26 DIAGNOSIS — R1084 Generalized abdominal pain: Secondary | ICD-10-CM | POA: Diagnosis not present

## 2022-11-26 DIAGNOSIS — R197 Diarrhea, unspecified: Secondary | ICD-10-CM | POA: Insufficient documentation

## 2022-11-26 DIAGNOSIS — Z794 Long term (current) use of insulin: Secondary | ICD-10-CM | POA: Insufficient documentation

## 2022-11-26 DIAGNOSIS — R000 Tachycardia, unspecified: Secondary | ICD-10-CM | POA: Insufficient documentation

## 2022-11-26 DIAGNOSIS — R112 Nausea with vomiting, unspecified: Secondary | ICD-10-CM

## 2022-11-26 DIAGNOSIS — R109 Unspecified abdominal pain: Secondary | ICD-10-CM | POA: Diagnosis present

## 2022-11-26 MED ORDER — HYDROMORPHONE HCL 1 MG/ML IJ SOLN
1.0000 mg | Freq: Once | INTRAMUSCULAR | Status: AC
  Administered 2022-11-27: 1 mg via INTRAVENOUS
  Filled 2022-11-26: qty 1

## 2022-11-26 MED ORDER — METOCLOPRAMIDE HCL 5 MG/ML IJ SOLN
10.0000 mg | Freq: Once | INTRAMUSCULAR | Status: AC
  Administered 2022-11-27: 10 mg via INTRAVENOUS
  Filled 2022-11-26: qty 2

## 2022-11-26 NOTE — ED Provider Notes (Signed)
[facility]  Provider Note  CSN: [id number] Arrival date & time: 11/26/22 2244  History Chief Complaint  Patient presents with   Abdominal Pain    Kathy Lewis is a 27 y.o. female with history of ESRD previously on PD (still has her PD catheter) but now on HD T, Th, Sa for the last several months has chronic abdominal pain and vomiting, previously seen in the ED for same. Has not had a formal diagnosis but presumed to be gastroparesis. She was in the ED yesterday for same. Had labs done which were unchanged from baseline. Given Reglan then with improvement. She reported then that diarrhea was more problematic than her vomiting but now reports more vomiting and abdominal pain similar to previous. No reported fever. Rarely makes urine. She reports Reglan Rx was not helping.    Home Medications Prior to Admission medications   Medication Sig Start Date End Date Taking? Authorizing Provider  acetaminophen (TYLENOL) 500 MG tablet Take 1,000 mg by mouth as needed for headache (pain).    [provider]  amitriptyline (ELAVIL) 50 MG tablet Take 1 tablet (50 mg total) by mouth at bedtime. 08/30/22   Salley Slaughter, NP  amLODipine (NORVASC) 10 MG tablet Take 1 tablet (10 mg total) by mouth daily. 10/09/22 11/20/23  Arrien, Jimmy Picket, MD  atorvastatin (LIPITOR) 40 MG tablet Take 40 mg by mouth every evening.    [provider]  calcium acetate (PHOSLO) 667 MG capsule Take 1,334 mg by mouth 3 (three) times daily with meals. 09/28/22   [provider]  carvedilol (COREG) 12.5 MG tablet Take 1 tablet (12.5 mg total) by mouth 2 (two) times daily with a meal. 10/08/22 11/20/23  Arrien, Jimmy Picket, MD  clonazePAM (KLONOPIN) 0.5 MG tablet Take 1 tablet (0.5 mg total) by mouth 2 (two) times daily as needed for anxiety. 03/16/22   [name], MD  hydrALAZINE (APRESOLINE) 50 MG tablet Take 1 tablet (50 mg total) by mouth in  the morning and at bedtime. 10/08/22 10/08/23  Arrien, Jimmy Picket, MD  hydrOXYzine (ATARAX) 10 MG tablet Take 10 mg by mouth at bedtime.    [provider]  insulin lispro (HUMALOG) 100 UNIT/ML injection Inject 0.5 Units into the skin See admin instructions. 0.5 units per hour via insulin pump. 01/08/22   [provider]  isosorbide mononitrate (IMDUR) 30 MG 24 hr tablet Take 1 tablet (30 mg total) by mouth daily. 10/09/22 11/20/23  Arrien, Jimmy Picket, MD  magnesium oxide (MAG-OX) 400 MG tablet Take 800 mg by mouth daily.    [provider]  metoCLOPramide (REGLAN) 5 MG tablet Take 1 tablet (5 mg total) by mouth as needed for nausea or vomiting. [id number]   Delora Fuel, MD  ondansetron (ZOFRAN) 4 MG tablet Take 4 mg by mouth as needed for nausea or vomiting.    [provider]  pantoprazole (PROTONIX) 40 MG tablet Take 1 tablet (40 mg total) by mouth daily. 10/08/22 11/20/23  Arrien, Jimmy Picket, MD  Vitamin D, Ergocalciferol, (DRISDOL) 1.25 MG (50000 UNIT) CAPS capsule Take 50,000 Units by mouth every Sunday.    [provider]  escitalopram (LEXAPRO) 10 MG tablet Take 20 mg by mouth daily.  05/10/20 10/06/20  [provider]  furosemide (LASIX) 40 MG tablet Take 1 tablet (40 mg total) by mouth daily. Patient not taking: Reported on 02/16/2021 10/28/19 02/17/21  [name], MD  lisinopril (ZESTRIL) 10 MG  tablet Take 10 mg by mouth daily.  02/23/20 10/06/20  [provider]  spironolactone (ALDACTONE) 25 MG tablet Take 25 mg by mouth daily.  04/20/20 10/06/20  [provider]     Allergies    Cantaloupe extract allergy skin test, Citrullus vulgaris, Food, Nsaids, and Strawberry extract   Review of Systems   Review of Systems Please see HPI for pertinent positives and negatives  Physical Exam BP (!) 132/110 (BP Location: Right Arm)   Pulse (!) 113   Temp 97.8 F (36.6 C)   Resp 20   SpO2 99%   Physical Exam Vitals  and nursing note reviewed.  Constitutional:      Appearance: Normal appearance.  HENT:     Head: Normocephalic and atraumatic.     Nose: Nose normal.     Mouth/Throat:     Mouth: Mucous membranes are moist.  Eyes:     Extraocular Movements: Extraocular movements intact.     Conjunctiva/sclera: Conjunctivae normal.  Cardiovascular:     Rate and Rhythm: Tachycardia present.  Pulmonary:     Effort: Pulmonary effort is normal.     Breath sounds: Normal breath sounds.     Comments: HD catheter in R upper chest Abdominal:     General: Abdomen is flat.     Palpations: Abdomen is soft.     Tenderness: There is generalized abdominal tenderness. There is no guarding. Negative signs include Murphy's sign and McBurney's sign.     Comments: PD catheter in R abdomen, no external signs of infection  Musculoskeletal:        General: No swelling. Normal range of motion.     Cervical back: Neck supple.  Skin:    General: Skin is warm and dry.  Neurological:     General: No focal deficit present.     Mental Status: She is alert.  Psychiatric:        Mood and Affect: Mood normal.     ED Results / Procedures / Treatments   EKG EKG Interpretation  Date/Time:  Sunday November 26 2022 23:01:05 EDT Ventricular Rate:  115 PR Interval:  131 QRS Duration: 96 QT Interval:  388 QTC Calculation: 537 R Axis:   134 Text Interpretation: Sinus tachycardia Multiform ventricular premature complexes LAE, consider biatrial enlargement Low voltage with right axis deviation Prolonged QT interval Since last tracing PVCs now present Otherwise no change, QT remains prolonged Confirmed by Calvert Cantor [phone]) on 11/26/2022 11:28:16 PM  Procedures Procedures  Medications Ordered in the ED Medications  HYDROmorphone (DILAUDID) injection 1 mg (has no administration in time range)  metoCLOPramide (REGLAN) injection 10 mg (has no administration in time range)    Initial Impression and Plan  Patient here with  exacerbation of chronic abdominal pain. Also has other significant comorbidities. Denies any fever today and not using PD catheter for her dialysis (she still flushes it weekly) so doubt SBP, especially given the chronic nature of her symptoms. She denies EtOH or drug use including THC. Has had prolonged QT in the past limiting her antiemetic choices. Will give a dose of Reglan and dilaudid as this combination has been helpful in the past. Recheck labs to compare to yesterday.   ED Course       MDM Rules/Calculators/A&P Medical Decision Making Amount and/or Complexity of Data Reviewed Labs: ordered.  Risk Prescription drug management.     Final Clinical Impression(s) / ED Diagnoses Final diagnoses:  None    Rx / DC Orders ED  Discharge Orders     None

## 2022-11-26 NOTE — ED Triage Notes (Signed)
Pt here for abd pain and vomiting, seen yesterday for the same. Pt reports having 4 episodes of vomiting today. Hx DM, dialysis T, Th and Sat, and heart failure.

## 2022-11-27 LAB — CBC WITH DIFFERENTIAL/PLATELET
Abs Immature Granulocytes: 0.01 10*3/uL (ref 0.00–0.07)
Basophils Absolute: 0 10*3/uL (ref 0.0–0.1)
Basophils Relative: 0 %
Eosinophils Absolute: 0 10*3/uL (ref 0.0–0.5)
Eosinophils Relative: 0 %
HCT: 40.5 % (ref 36.0–46.0)
Hemoglobin: 13.1 g/dL (ref 12.0–15.0)
Immature Granulocytes: 0 %
Lymphocytes Relative: 12 %
Lymphs Abs: 0.6 10*3/uL — ABNORMAL LOW (ref 0.7–4.0)
MCH: 30.9 pg (ref 26.0–34.0)
MCHC: 32.3 g/dL (ref 30.0–36.0)
MCV: 95.5 fL (ref 80.0–100.0)
Monocytes Absolute: 0.3 10*3/uL (ref 0.1–1.0)
Monocytes Relative: 5 %
Neutro Abs: 4.3 10*3/uL (ref 1.7–7.7)
Neutrophils Relative %: 83 %
Platelets: 274 10*3/uL (ref 150–400)
RBC: 4.24 MIL/uL (ref 3.87–5.11)
RDW: 15.5 % (ref 11.5–15.5)
WBC: 5.2 10*3/uL (ref 4.0–10.5)
nRBC: 0 % (ref 0.0–0.2)

## 2022-11-27 LAB — COMPREHENSIVE METABOLIC PANEL
ALT: 15 U/L (ref 0–44)
AST: 17 U/L (ref 15–41)
Albumin: 2.5 g/dL — ABNORMAL LOW (ref 3.5–5.0)
Alkaline Phosphatase: 88 U/L (ref 38–126)
Anion gap: 11 (ref 5–15)
BUN: 57 mg/dL — ABNORMAL HIGH (ref 6–20)
CO2: 21 mmol/L — ABNORMAL LOW (ref 22–32)
Calcium: 7.2 mg/dL — ABNORMAL LOW (ref 8.9–10.3)
Chloride: 102 mmol/L (ref 98–111)
Creatinine, Ser: 7.89 mg/dL — ABNORMAL HIGH (ref 0.44–1.00)
GFR, Estimated: 7 mL/min — ABNORMAL LOW (ref >60)
Glucose, Bld: 111 mg/dL — ABNORMAL HIGH (ref 70–99)
Potassium: 4.2 mmol/L (ref 3.5–5.1)
Sodium: 134 mmol/L — ABNORMAL LOW (ref 135–145)
Total Bilirubin: 0.6 mg/dL (ref 0.3–1.2)
Total Protein: 6.1 g/dL — ABNORMAL LOW (ref 6.5–8.1)

## 2022-11-27 MED ORDER — DIPHENHYDRAMINE HCL 50 MG/ML IJ SOLN
25.0000 mg | Freq: Once | INTRAMUSCULAR | Status: AC
  Administered 2022-11-27: 25 mg via INTRAVENOUS
  Filled 2022-11-27: qty 1

## 2022-11-27 NOTE — ED Notes (Signed)
Patient reports improvement in nausea, vomiting, abdominal pain, and itching.

## 2022-11-28 ENCOUNTER — Encounter (HOSPITAL_BASED_OUTPATIENT_CLINIC_OR_DEPARTMENT_OTHER): Payer: Self-pay | Admitting: Emergency Medicine

## 2022-11-28 ENCOUNTER — Encounter: Payer: Self-pay | Admitting: Family Medicine

## 2022-11-28 ENCOUNTER — Emergency Department (HOSPITAL_BASED_OUTPATIENT_CLINIC_OR_DEPARTMENT_OTHER)
Admission: EM | Admit: 2022-11-28 | Discharge: 2022-11-28 | Discharge status: Against Medical Advice | Payer: Medicaid Other | Attending: Emergency Medicine | Admitting: Emergency Medicine

## 2022-11-28 ENCOUNTER — Other Ambulatory Visit: Payer: Self-pay

## 2022-11-28 DIAGNOSIS — R112 Nausea with vomiting, unspecified: Secondary | ICD-10-CM | POA: Diagnosis present

## 2022-11-28 DIAGNOSIS — G8929 Other chronic pain: Secondary | ICD-10-CM | POA: Diagnosis not present

## 2022-11-28 DIAGNOSIS — I509 Heart failure, unspecified: Secondary | ICD-10-CM | POA: Insufficient documentation

## 2022-11-28 DIAGNOSIS — R109 Unspecified abdominal pain: Secondary | ICD-10-CM | POA: Diagnosis not present

## 2022-11-28 DIAGNOSIS — Z992 Dependence on renal dialysis: Secondary | ICD-10-CM | POA: Diagnosis not present

## 2022-11-28 DIAGNOSIS — N186 End stage renal disease: Secondary | ICD-10-CM | POA: Diagnosis not present

## 2022-11-28 LAB — CBC WITH DIFFERENTIAL/PLATELET
Abs Immature Granulocytes: 0.01 10*3/uL (ref 0.00–0.07)
Basophils Absolute: 0.1 10*3/uL (ref 0.0–0.1)
Basophils Relative: 2 %
Eosinophils Absolute: 0.1 10*3/uL (ref 0.0–0.5)
Eosinophils Relative: 2 %
HCT: 39.4 % (ref 36.0–46.0)
Hemoglobin: 12.5 g/dL (ref 12.0–15.0)
Immature Granulocytes: 0 %
Lymphocytes Relative: 31 %
Lymphs Abs: 1.1 10*3/uL (ref 0.7–4.0)
MCH: 30.3 pg (ref 26.0–34.0)
MCHC: 31.7 g/dL (ref 30.0–36.0)
MCV: 95.4 fL (ref 80.0–100.0)
Monocytes Absolute: 0.2 10*3/uL (ref 0.1–1.0)
Monocytes Relative: 6 %
Neutro Abs: 2 10*3/uL (ref 1.7–7.7)
Neutrophils Relative %: 59 %
Platelets: 224 10*3/uL (ref 150–400)
RBC: 4.13 MIL/uL (ref 3.87–5.11)
RDW: 15.9 % — ABNORMAL HIGH (ref 11.5–15.5)
WBC: 3.4 10*3/uL — ABNORMAL LOW (ref 4.0–10.5)
nRBC: 0 % (ref 0.0–0.2)

## 2022-11-28 LAB — COMPREHENSIVE METABOLIC PANEL
ALT: 14 U/L (ref 0–44)
AST: 15 U/L (ref 15–41)
Albumin: 2.4 g/dL — ABNORMAL LOW (ref 3.5–5.0)
Alkaline Phosphatase: 80 U/L (ref 38–126)
Anion gap: 10 (ref 5–15)
BUN: 33 mg/dL — ABNORMAL HIGH (ref 6–20)
CO2: 22 mmol/L (ref 22–32)
Calcium: 7.6 mg/dL — ABNORMAL LOW (ref 8.9–10.3)
Chloride: 103 mmol/L (ref 98–111)
Creatinine, Ser: 6.15 mg/dL — ABNORMAL HIGH (ref 0.44–1.00)
GFR, Estimated: 9 mL/min — ABNORMAL LOW (ref >60)
Glucose, Bld: 146 mg/dL — ABNORMAL HIGH (ref 70–99)
Potassium: 4 mmol/L (ref 3.5–5.1)
Sodium: 135 mmol/L (ref 135–145)
Total Bilirubin: 0.6 mg/dL (ref 0.3–1.2)
Total Protein: 6 g/dL — ABNORMAL LOW (ref 6.5–8.1)

## 2022-11-28 MED ORDER — METOCLOPRAMIDE HCL 5 MG/ML IJ SOLN
10.0000 mg | Freq: Once | INTRAMUSCULAR | Status: AC
  Administered 2022-11-28: 10 mg via INTRAVENOUS
  Filled 2022-11-28: qty 2

## 2022-11-28 MED ORDER — HYDROMORPHONE HCL 1 MG/ML IJ SOLN
1.0000 mg | Freq: Once | INTRAMUSCULAR | Status: AC
  Administered 2022-11-28: 1 mg via INTRAVENOUS
  Filled 2022-11-28: qty 1

## 2022-11-28 NOTE — ED Provider Notes (Addendum)
[facility] AT [facility]  Provider Note  CSN: [id number] Arrival date & time: 11/28/22 0222  History Chief Complaint  Patient presents with   Abdominal Pain    Kathy Lewis is a 27 y.o. female  with history of CHF, ESRD previously on PD (still has her PD catheter) but now on HD T, Th, Sa for the last several months has chronic abdominal pain and vomiting, seen in the ED multiple times over the last several months. Has not had a formal diagnosis but presumed to be gastroparesis. She was in the ED yesterday and the day before for same. Had labs done which were unchanged from baseline. Given Reglan and dilaudid then with improvement and ultimately discharged home. She had dialysis in morning of 4/1. Pain and vomiting returned tonight, unable to sleep.   Home Medications Prior to Admission medications   Medication Sig Start Date End Date Taking? Authorizing Provider  acetaminophen (TYLENOL) 500 MG tablet Take 1,000 mg by mouth as needed for headache (pain).    [provider]  amitriptyline (ELAVIL) 50 MG tablet Take 1 tablet (50 mg total) by mouth at bedtime. 08/30/22   Salley Slaughter, NP  amLODipine (NORVASC) 10 MG tablet Take 1 tablet (10 mg total) by mouth daily. 10/09/22 11/20/23  Arrien, Jimmy Picket, MD  atorvastatin (LIPITOR) 40 MG tablet Take 40 mg by mouth every evening.    [provider]  calcium acetate (PHOSLO) 667 MG capsule Take 1,334 mg by mouth 3 (three) times daily with meals. 09/28/22   [provider]  carvedilol (COREG) 12.5 MG tablet Take 1 tablet (12.5 mg total) by mouth 2 (two) times daily with a meal. 10/08/22 11/20/23  Arrien, Jimmy Picket, MD  clonazePAM (KLONOPIN) 0.5 MG tablet Take 1 tablet (0.5 mg total) by mouth 2 (two) times daily as needed for anxiety. 03/16/22   [name], MD  hydrALAZINE (APRESOLINE) 50 MG tablet Take 1 tablet (50 mg total) by mouth in the morning and at bedtime. 10/08/22  10/08/23  Arrien, Jimmy Picket, MD  hydrOXYzine (ATARAX) 10 MG tablet Take 10 mg by mouth at bedtime.    [provider]  insulin lispro (HUMALOG) 100 UNIT/ML injection Inject 0.5 Units into the skin See admin instructions. 0.5 units per hour via insulin pump. 01/08/22   [provider]  isosorbide mononitrate (IMDUR) 30 MG 24 hr tablet Take 1 tablet (30 mg total) by mouth daily. 10/09/22 11/20/23  Arrien, Jimmy Picket, MD  magnesium oxide (MAG-OX) 400 MG tablet Take 800 mg by mouth daily.    [provider]  metoCLOPramide (REGLAN) 5 MG tablet Take 1 tablet (5 mg total) by mouth as needed for nausea or vomiting. Q000111Q   Delora Fuel, MD  ondansetron (ZOFRAN) 4 MG tablet Take 4 mg by mouth as needed for nausea or vomiting.    [provider]  pantoprazole (PROTONIX) 40 MG tablet Take 1 tablet (40 mg total) by mouth daily. 10/08/22 11/20/23  Arrien, Jimmy Picket, MD  Vitamin D, Ergocalciferol, (DRISDOL) 1.25 MG (50000 UNIT) CAPS capsule Take 50,000 Units by mouth every Sunday.    [provider]  escitalopram (LEXAPRO) 10 MG tablet Take 20 mg by mouth daily.  05/10/20 10/06/20  [provider]  furosemide (LASIX) 40 MG tablet Take 1 tablet (40 mg total) by mouth daily. Patient not taking: Reported on 02/16/2021 10/28/19 02/17/21  [name], MD  lisinopril (ZESTRIL) 10 MG tablet Take 10 mg by  mouth daily.  02/23/20 10/06/20  [provider]  spironolactone (ALDACTONE) 25 MG tablet Take 25 mg by mouth daily.  04/20/20 10/06/20  [provider]     Allergies    Cantaloupe extract allergy skin test, Citrullus vulgaris, Food, Nsaids, and Strawberry extract   Review of Systems   Review of Systems Please see HPI for pertinent positives and negatives  Physical Exam BP 105/89 (BP Location: Left Arm)   Pulse (!) 102   Temp 98.2 F (36.8 C) (Oral)   Resp 20   Ht 5' (1.524 m)   SpO2 98%   BMI 25.08 kg/m   Physical  Exam Vitals and nursing note reviewed.  HENT:     Head: Normocephalic.     Nose: Nose normal.  Eyes:     Extraocular Movements: Extraocular movements intact.  Pulmonary:     Effort: Pulmonary effort is normal.     Comments: HD catheter in R chest Abdominal:     Comments: PD catheter in R abdomen  Musculoskeletal:        General: Normal range of motion.     Cervical back: Neck supple.  Skin:    Findings: No rash (on exposed skin).  Neurological:     Mental Status: She is alert and oriented to person, place, and time.  Psychiatric:        Mood and Affect: Mood normal.     ED Results / Procedures / Treatments   EKG None  Procedures Procedures  Medications Ordered in the ED Medications  HYDROmorphone (DILAUDID) injection 1 mg (1 mg Intravenous Given 11/28/22 0330)  metoCLOPramide (REGLAN) injection 10 mg (10 mg Intravenous Given 11/28/22 0330)    Initial Impression and Plan  Patient here with third visit in three days of abdominal pain and vomiting. Will recheck labs, meds for symptoms and plan admission. Patient is amenable.   ED Course   Clinical Course as of 11/28/22 0502  Tue Nov 28, 2022  0403 CBC is unchanged.  [CS]  [id number] CMP similar to previous, shows CKD. Given persistent symptoms and recurrent ED visits, will consult Hospitalist for admission.  [CS]  [phone] Spoke with Dr. Sidney Ace. Hospitalist who will accept for admission.  [CS]  0459 Patient now states she does not want to be hospitalized. She wants to go home and go to her outpatient dialysis appointment later this morning. I again advised that given her repeated ED visits for uncontrolled symptoms she should be admitted for symptom control but she again refuses. She was advised that if she does not want to be admitted, she should seek outpatient management of her chronic symptoms. Encouraged to RTED for any new or acute symptoms.  [CS]    Clinical Course User Index [CS] Truddie Hidden, MD     MDM  Rules/Calculators/A&P Medical Decision Making Problems Addressed: Chronic abdominal pain: chronic illness or injury with exacerbation, progression, or side effects of treatment ESRD on hemodialysis: chronic illness or injury with exacerbation, progression, or side effects of treatment  Amount and/or Complexity of Data Reviewed Labs: ordered. Decision-making details documented in ED Course.  Risk Prescription drug management. Parenteral controlled substances. Decision regarding hospitalization.     Final Clinical Impression(s) / ED Diagnoses Final diagnoses:  Chronic abdominal pain  ESRD on hemodialysis    Rx / DC Orders ED Discharge Orders     None        Truddie Hidden, MD 11/28/22 [id number]    Truddie Hidden, MD 11/28/22 [phone]

## 2022-11-28 NOTE — ED Triage Notes (Signed)
Abd pain with N/V and feels possible diarrhea coming on. States has chills and unable to sleep.

## 2022-12-12 ENCOUNTER — Telehealth: Payer: Self-pay | Admitting: *Deleted

## 2022-12-12 NOTE — Telephone Encounter (Signed)
   Pre-operative Risk Assessment    Patient Name: Kathy Lewis  DOB: [birth year] MRN: [id number]      Request for Surgical Clearance    Procedure:   PD CATHETER REMOVAL   Date of Surgery:  Clearance 12/25/22                                 Surgeon:  DR. Greig Castilla WHITE Surgeon's Group or Practice Name:  [facility] Phone number:  [phone] Fax number:  [phone]   Type of Clearance Requested:   - Medical    Type of Anesthesia:  PER DR. Foye Spurling OV NOTES 12/12/22:  Given her EF of less than 20% we will try to perform this under sedation without general anesthesia.   Additional requests/questions:    Elpidio Anis   12/12/2022, 5:49 PM

## 2022-12-14 ENCOUNTER — Other Ambulatory Visit: Payer: Self-pay

## 2022-12-14 ENCOUNTER — Encounter (HOSPITAL_COMMUNITY): Payer: Self-pay | Admitting: Emergency Medicine

## 2022-12-14 ENCOUNTER — Emergency Department (HOSPITAL_COMMUNITY)
Admission: EM | Admit: 2022-12-14 | Discharge: 2022-12-14 | Disposition: A | Discharge status: Home / Self Care | Payer: Medicaid Other | Attending: Emergency Medicine | Admitting: Emergency Medicine

## 2022-12-14 DIAGNOSIS — G8929 Other chronic pain: Secondary | ICD-10-CM | POA: Diagnosis not present

## 2022-12-14 DIAGNOSIS — N186 End stage renal disease: Secondary | ICD-10-CM | POA: Diagnosis not present

## 2022-12-14 DIAGNOSIS — I5020 Unspecified systolic (congestive) heart failure: Secondary | ICD-10-CM | POA: Diagnosis not present

## 2022-12-14 DIAGNOSIS — Z992 Dependence on renal dialysis: Secondary | ICD-10-CM | POA: Insufficient documentation

## 2022-12-14 DIAGNOSIS — R1084 Generalized abdominal pain: Secondary | ICD-10-CM | POA: Insufficient documentation

## 2022-12-14 DIAGNOSIS — E875 Hyperkalemia: Secondary | ICD-10-CM | POA: Insufficient documentation

## 2022-12-14 DIAGNOSIS — Z794 Long term (current) use of insulin: Secondary | ICD-10-CM | POA: Diagnosis not present

## 2022-12-14 DIAGNOSIS — E1022 Type 1 diabetes mellitus with diabetic chronic kidney disease: Secondary | ICD-10-CM | POA: Insufficient documentation

## 2022-12-14 DIAGNOSIS — R000 Tachycardia, unspecified: Secondary | ICD-10-CM | POA: Diagnosis not present

## 2022-12-14 LAB — COMPREHENSIVE METABOLIC PANEL
ALT: 12 U/L (ref 0–44)
AST: 22 U/L (ref 15–41)
Albumin: 2.3 g/dL — ABNORMAL LOW (ref 3.5–5.0)
Alkaline Phosphatase: 86 U/L (ref 38–126)
Anion gap: 13 (ref 5–15)
BUN: 31 mg/dL — ABNORMAL HIGH (ref 6–20)
CO2: 21 mmol/L — ABNORMAL LOW (ref 22–32)
Calcium: 8.5 mg/dL — ABNORMAL LOW (ref 8.9–10.3)
Chloride: 99 mmol/L (ref 98–111)
Creatinine, Ser: 4.98 mg/dL — ABNORMAL HIGH (ref 0.44–1.00)
GFR, Estimated: 12 mL/min — ABNORMAL LOW (ref >60)
Glucose, Bld: 125 mg/dL — ABNORMAL HIGH (ref 70–99)
Potassium: 5.4 mmol/L — ABNORMAL HIGH (ref 3.5–5.1)
Sodium: 133 mmol/L — ABNORMAL LOW (ref 135–145)
Total Bilirubin: 1.1 mg/dL (ref 0.3–1.2)
Total Protein: 6.1 g/dL — ABNORMAL LOW (ref 6.5–8.1)

## 2022-12-14 LAB — CBC WITH DIFFERENTIAL/PLATELET
Abs Immature Granulocytes: 0.02 10*3/uL (ref 0.00–0.07)
Basophils Absolute: 0 10*3/uL (ref 0.0–0.1)
Basophils Relative: 1 %
Eosinophils Absolute: 0 10*3/uL (ref 0.0–0.5)
Eosinophils Relative: 0 %
HCT: 42.4 % (ref 36.0–46.0)
Hemoglobin: 13.3 g/dL (ref 12.0–15.0)
Immature Granulocytes: 0 %
Lymphocytes Relative: 8 %
Lymphs Abs: 0.5 10*3/uL — ABNORMAL LOW (ref 0.7–4.0)
MCH: 30.9 pg (ref 26.0–34.0)
MCHC: 31.4 g/dL (ref 30.0–36.0)
MCV: 98.6 fL (ref 80.0–100.0)
Monocytes Absolute: 0.4 10*3/uL (ref 0.1–1.0)
Monocytes Relative: 6 %
Neutro Abs: 5.5 10*3/uL (ref 1.7–7.7)
Neutrophils Relative %: 85 %
Platelets: 263 10*3/uL (ref 150–400)
RBC: 4.3 MIL/uL (ref 3.87–5.11)
RDW: 17.5 % — ABNORMAL HIGH (ref 11.5–15.5)
WBC: 6.4 10*3/uL (ref 4.0–10.5)
nRBC: 0 % (ref 0.0–0.2)

## 2022-12-14 LAB — LIPASE, BLOOD: Lipase: 29 U/L (ref 11–51)

## 2022-12-14 LAB — CBG MONITORING, ED: Glucose-Capillary: 118 mg/dL — ABNORMAL HIGH (ref 70–99)

## 2022-12-14 MED ORDER — METOCLOPRAMIDE HCL 5 MG/ML IJ SOLN
10.0000 mg | Freq: Once | INTRAMUSCULAR | Status: AC
  Administered 2022-12-14: 10 mg via INTRAVENOUS
  Filled 2022-12-14: qty 2

## 2022-12-14 MED ORDER — HYDROMORPHONE HCL 1 MG/ML IJ SOLN
0.5000 mg | Freq: Once | INTRAMUSCULAR | Status: AC
  Administered 2022-12-14: 0.5 mg via INTRAVENOUS
  Filled 2022-12-14: qty 1

## 2022-12-14 MED ORDER — DIPHENHYDRAMINE HCL 50 MG/ML IJ SOLN
12.5000 mg | Freq: Once | INTRAMUSCULAR | Status: AC
  Administered 2022-12-14: 12.5 mg via INTRAVENOUS
  Filled 2022-12-14: qty 1

## 2022-12-14 MED ORDER — LACTATED RINGERS IV BOLUS
250.0000 mL | Freq: Once | INTRAVENOUS | Status: AC
  Administered 2022-12-14: 250 mL via INTRAVENOUS

## 2022-12-14 MED ORDER — HYDROMORPHONE HCL 1 MG/ML IJ SOLN
1.0000 mg | Freq: Once | INTRAMUSCULAR | Status: AC
  Administered 2022-12-14: 1 mg via INTRAVENOUS
  Filled 2022-12-14: qty 1

## 2022-12-14 NOTE — ED Provider Notes (Signed)
[place] EMERGENCY DEPARTMENT AT [facility] Provider Note   CSN: [id number] Arrival date & time: 12/14/22  1557     History CHF, ESRD, HD  Chief Complaint  Patient presents with   Abdominal Pain    Kathy Lewis is a 27 y.o. female.  26 y.o female with a PMH CHF, ESRD previously on PD (still has her PD catheter) but now on HD T, Th, Sa for the last several months with her last full dialysis today. She endorses generalized abdominal cramping which began yesterday, reports she's had several episodes of non bilious, non bloody emesis. She tried oral reglan but reports she was unable to keep this down. There are no alleviating factors. She was given oral zofran by ems with some improvement in symptoms. No sick contacts, no fevers, no diarrhea.  The history is provided by the patient and medical records.  Abdominal Pain Pain location:  Generalized Pain quality: cramping   Pain radiates to:  Does not radiate Pain severity:  Severe Onset quality:  Gradual Duration:  1 day Timing:  Constant Progression:  Worsening Chronicity:  Recurrent Context: previous surgery   Relieved by:  Nothing Worsened by:  Nothing Associated symptoms: nausea and vomiting   Associated symptoms: no chest pain, no diarrhea, no fever, no shortness of breath and no sore throat        Home Medications Prior to Admission medications   Medication Sig Start Date End Date Taking? Authorizing Provider  acetaminophen (TYLENOL) 500 MG tablet Take 1,000 mg by mouth as needed for headache (pain).    [provider]  amitriptyline (ELAVIL) 50 MG tablet Take 1 tablet (50 mg total) by mouth at bedtime. 08/30/22   Shanna Cisco, NP  amLODipine (NORVASC) 10 MG tablet Take 1 tablet (10 mg total) by mouth daily. 10/09/22 11/20/23  Arrien, York Ram, MD  atorvastatin (LIPITOR) 40 MG tablet Take 40 mg by mouth every evening.    [provider]  calcium acetate (PHOSLO) 667 MG capsule  Take 1,334 mg by mouth 3 (three) times daily with meals. 09/28/22   [provider]  carvedilol (COREG) 12.5 MG tablet Take 1 tablet (12.5 mg total) by mouth 2 (two) times daily with a meal. 10/08/22 11/20/23  Arrien, York Ram, MD  clonazePAM (KLONOPIN) 0.5 MG tablet Take 1 tablet (0.5 mg total) by mouth 2 (two) times daily as needed for anxiety. 03/16/22   Leroy Sea, MD  hydrALAZINE (APRESOLINE) 50 MG tablet Take 1 tablet (50 mg total) by mouth in the morning and at bedtime. 10/08/22 10/08/23  Arrien, York Ram, MD  hydrOXYzine (ATARAX) 10 MG tablet Take 10 mg by mouth at bedtime.    [provider]  insulin lispro (HUMALOG) 100 UNIT/ML injection Inject 0.5 Units into the skin See admin instructions. 0.5 units per hour via insulin pump. 01/08/22   [provider]  isosorbide mononitrate (IMDUR) 30 MG 24 hr tablet Take 1 tablet (30 mg total) by mouth daily. 10/09/22 11/20/23  Arrien, York Ram, MD  magnesium oxide (MAG-OX) 400 MG tablet Take 800 mg by mouth daily.    [provider]  metoCLOPramide (REGLAN) 5 MG tablet Take 1 tablet (5 mg total) by mouth as needed for nausea or vomiting. 11/25/22   Dione Booze, MD  ondansetron (ZOFRAN) 4 MG tablet Take 4 mg by mouth as needed for nausea or vomiting.    [provider]  pantoprazole (PROTONIX) 40 MG tablet Take 1 tablet (40 mg  total) by mouth daily. 10/08/22 11/20/23  Arrien, York Ram, MD  Vitamin D, Ergocalciferol, (DRISDOL) 1.25 MG (50000 UNIT) CAPS capsule Take 50,000 Units by mouth every Sunday.    [provider]  escitalopram (LEXAPRO) 10 MG tablet Take 20 mg by mouth daily.  05/10/20 10/06/20  [provider]  furosemide (LASIX) 40 MG tablet Take 1 tablet (40 mg total) by mouth daily. Patient not taking: Reported on 02/16/2021 10/28/19 02/17/21  Geryl Rankins, MD  lisinopril (ZESTRIL) 10 MG tablet Take 10 mg by mouth daily.  02/23/20 10/06/20  [provider]   spironolactone (ALDACTONE) 25 MG tablet Take 25 mg by mouth daily.  04/20/20 10/06/20  [provider]      Allergies    Cantaloupe extract allergy skin test, Citrullus vulgaris, Food, Nsaids, and Strawberry extract    Review of Systems   Review of Systems  Constitutional:  Negative for fever.  HENT:  Negative for sore throat.   Respiratory:  Negative for shortness of breath.   Cardiovascular:  Negative for chest pain.  Gastrointestinal:  Positive for abdominal pain, nausea and vomiting. Negative for diarrhea.  Genitourinary:  Negative for flank pain.  Musculoskeletal:  Negative for back pain.  Skin:  Negative for pallor and wound.  All other systems reviewed and are negative.   Physical Exam Updated Vital Signs BP (!) 105/92   Pulse (!) 118   Temp 97.8 F (36.6 C) (Oral)   Resp 17   Ht 5' (1.524 m)   Wt 59 kg   SpO2 94%   BMI 25.40 kg/m  Physical Exam Vitals and nursing note reviewed.  Constitutional:      Appearance: She is well-developed. She is ill-appearing.  HENT:     Head: Normocephalic and atraumatic.  Cardiovascular:     Rate and Rhythm: Tachycardia present.  Pulmonary:     Effort: Pulmonary effort is normal.     Breath sounds: No wheezing or rales.  Abdominal:     General: Abdomen is flat. Bowel sounds are increased.     Palpations: Abdomen is soft.     Tenderness: There is generalized abdominal tenderness. There is no right CVA tenderness or left CVA tenderness.     Comments: PD catheter in place  Skin:    General: Skin is warm and dry.  Neurological:     Mental Status: She is alert and oriented to person, place, and time.     ED Results / Procedures / Treatments   Labs (all labs ordered are listed, but only abnormal results are displayed) Labs Reviewed  CBC WITH DIFFERENTIAL/PLATELET - Abnormal; Notable for the following components:      Result Value   RDW 17.5 (*)    Lymphs Abs 0.5 (*)    All other components within normal limits   COMPREHENSIVE METABOLIC PANEL - Abnormal; Notable for the following components:   Sodium 133 (*)    Potassium 5.4 (*)    CO2 21 (*)    Glucose, Bld 125 (*)    BUN 31 (*)    Creatinine, Ser 4.98 (*)    Calcium 8.5 (*)    Total Protein 6.1 (*)    Albumin 2.3 (*)    GFR, Estimated 12 (*)    All other components within normal limits  CBG MONITORING, ED - Abnormal; Notable for the following components:   Glucose-Capillary 118 (*)    All other components within normal limits  LIPASE, BLOOD    EKG None  Radiology  No results found.  Procedures Procedures    Medications Ordered in ED Medications  HYDROmorphone (DILAUDID) injection 0.5 mg (has no administration in time range)  lactated ringers bolus 250 mL (0 mLs Intravenous Stopped 12/14/22 1706)  metoCLOPramide (REGLAN) injection 10 mg (10 mg Intravenous Given 12/14/22 1644)  diphenhydrAMINE (BENADRYL) injection 12.5 mg (12.5 mg Intravenous Given 12/14/22 1645)  HYDROmorphone (DILAUDID) injection 1 mg (1 mg Intravenous Given 12/14/22 1655)    ED Course/ Medical Decision Making/ A&P                             Medical Decision Making Amount and/or Complexity of Data Reviewed Labs: ordered.  Risk Prescription drug management.     This patient presents to the ED for concern of abdominal pain, nausea, vomiting, this involves a number of treatment options, and is a complaint that carries with it a high risk of complications and morbidity.  The differential diagnosis includes acute on chronic pain, cyclic vomiting,    Co morbidities: Discussed in HPI   Brief History:  See HPI.   EMR reviewed including pt PMHx, past surgical history and past visits to ER.   See HPI for more details   Lab Tests:  I ordered and independently interpreted labs.  The pertinent results include:    Labs notable for CBC with no leukocytosis, hemoglobin is within normal limits.  CMP remarkable for some hyperkalemia although she was fully  dialyzed today.  Creatinine level is improved from her baseline at 4.98, LFTs are within normal limits.    Imaging Studies:  No imaging studies ordered for this patient  Cardiac Monitoring:  The patient was maintained on a cardiac monitor.  I personally viewed and interpreted the cardiac monitored which showed an underlying rhythm of: Sinus tachycardia 128 EKG non-ischemic   Medicines ordered:  I ordered medication including reglan, benadryl  for symptomatic treatment Reevaluation of the patient after these medicines showed that the patient resolved I have reviewed the patients home medicines and have made adjustments as needed  Reevaluation:  After the interventions noted above I re-evaluated patient and found that they have :resolved   Social Determinants of Health:  The patient's social determinants of health were a factor in the care of this patient  Problem List / ED Course:  Patient with underlying history of diabetes type 1, systolic heart failure, bipolar, end-stage renal disease on hemodialysis last dialyzed T, TH,S presents to the ED via EMS with nausea, vomiting, acute on chronic abdominal pain which began yesterday.  Reporting multiple episodes, tried p.o. Reglan without any improvement arrived to the ED tachycardic with a HR in the 120's. Extensive chart review with a ECHO from 10/05/2022 1. Left ventricular ejection fraction, by estimation, is <20%.  Patient was given 250 bolus, along with Dilaudid which she was requesting for pain control, this did resolve her pain. She is nonfocal on exam, there is no focal point of tenderness.  Serial abdominal exams and improvement after Dilaudid.  She does report being tested for gastroparesis but this was negative, however abdominal pain does feel the same as her prior. Her labs are at baseline without any abnormalities, aside from some slight elevation in her potassium however was dialyzed today.  We discussed admission into the  hospital due to her tachycardia along with dehydration and needing slow titration of fluid, however patient prefers to follow-up outpatient with cardiology at this time.  Vitals are stable aside  from tachycardia present.  Strict return precautions provided at length.  Dispostion:  After consideration of the diagnostic results and the patients response to treatment, I feel that the patent would benefit from further management of chronic abdominal pain.    Portions of this note were generated with Scientist, clinical (histocompatibility and immunogenetics). Dictation errors may occur despite best attempts at proofreading.   Final Clinical Impression(s) / ED Diagnoses Final diagnoses:  Chronic abdominal pain    Rx / DC Orders ED Discharge Orders     None         Claude Manges, PA-C 12/14/22 1927    Shon Baton, MD 12/16/22 [phone]

## 2022-12-14 NOTE — ED Triage Notes (Signed)
Pt BIB [facility] from home due abdominal pain.  Pt gets dialysis Tuesday, Thursday and Saturday.  Pt did get complete treatment today.  Pt does have port on right side of chest.  Pt does have diabetes.  Pt does have continous pump for CBG currrently reading 130.  Pump code 4840.  Pt given 4mg  of Zofran PO.  Hx of heart failure.

## 2022-12-14 NOTE — Discharge Instructions (Addendum)
Your laboratory results were within normal limits.   Your heart rate was elevated during your ER visit, we suggested admission however this was deferred by you to follow-up with cardiology.  Please return to the emergency department if experience any worsening symptoms.

## 2022-12-15 NOTE — Telephone Encounter (Signed)
   Primary Cardiologist: None  Chart reviewed as part of pre-operative protocol coverage. Given past medical history and time since last visit, based on ACC/AHA guidelines, Kathy Lewis would be at acceptable risk for the planned procedure without further cardiovascular testing.   Patient was advised that if she develops new symptoms prior to surgery to contact our office to arrange a follow-up appointment. She verbalized understanding.  Recommendation to avoid general anesthesia if at all possible per primary cardiologist, Dr. Gala Romney.   I will route this recommendation to the requesting party via Epic fax function and remove from pre-op pool.  Please call with questions.   Levi Aland, NP-C  12/15/2022, 2:49 PM [address] Office [phone] Fax [phone]

## 2022-12-17 ENCOUNTER — Other Ambulatory Visit: Payer: Self-pay

## 2022-12-17 ENCOUNTER — Encounter (HOSPITAL_COMMUNITY): Payer: Self-pay

## 2022-12-17 ENCOUNTER — Emergency Department (HOSPITAL_COMMUNITY)
Admission: EM | Admit: 2022-12-17 | Discharge: 2022-12-17 | Disposition: A | Discharge status: Home / Self Care | Payer: Medicaid Other | Attending: Emergency Medicine | Admitting: Emergency Medicine

## 2022-12-17 DIAGNOSIS — Z79899 Other long term (current) drug therapy: Secondary | ICD-10-CM | POA: Diagnosis not present

## 2022-12-17 DIAGNOSIS — R1110 Vomiting, unspecified: Secondary | ICD-10-CM

## 2022-12-17 DIAGNOSIS — R112 Nausea with vomiting, unspecified: Secondary | ICD-10-CM | POA: Insufficient documentation

## 2022-12-17 DIAGNOSIS — Z794 Long term (current) use of insulin: Secondary | ICD-10-CM | POA: Diagnosis not present

## 2022-12-17 DIAGNOSIS — R110 Nausea: Secondary | ICD-10-CM

## 2022-12-17 DIAGNOSIS — R1084 Generalized abdominal pain: Secondary | ICD-10-CM | POA: Diagnosis present

## 2022-12-17 LAB — CBC WITH DIFFERENTIAL/PLATELET
Abs Immature Granulocytes: 0.04 10*3/uL (ref 0.00–0.07)
Basophils Absolute: 0 10*3/uL (ref 0.0–0.1)
Basophils Relative: 1 %
Eosinophils Absolute: 0.2 10*3/uL (ref 0.0–0.5)
Eosinophils Relative: 3 %
HCT: 41.9 % (ref 36.0–46.0)
Hemoglobin: 13.5 g/dL (ref 12.0–15.0)
Immature Granulocytes: 1 %
Lymphocytes Relative: 16 %
Lymphs Abs: 0.9 10*3/uL (ref 0.7–4.0)
MCH: 31.3 pg (ref 26.0–34.0)
MCHC: 32.2 g/dL (ref 30.0–36.0)
MCV: 97 fL (ref 80.0–100.0)
Monocytes Absolute: 0.4 10*3/uL (ref 0.1–1.0)
Monocytes Relative: 8 %
Neutro Abs: 4 10*3/uL (ref 1.7–7.7)
Neutrophils Relative %: 71 %
Platelets: 316 10*3/uL (ref 150–400)
RBC: 4.32 MIL/uL (ref 3.87–5.11)
RDW: 17.2 % — ABNORMAL HIGH (ref 11.5–15.5)
WBC: 5.6 10*3/uL (ref 4.0–10.5)
nRBC: 0 % (ref 0.0–0.2)

## 2022-12-17 LAB — I-STAT BETA HCG BLOOD, ED (MC, WL, AP ONLY): I-stat hCG, quantitative: <5 m[IU]/mL (ref <5)

## 2022-12-17 LAB — COMPREHENSIVE METABOLIC PANEL
ALT: 11 U/L (ref 0–44)
AST: 13 U/L — ABNORMAL LOW (ref 15–41)
Albumin: 2.3 g/dL — ABNORMAL LOW (ref 3.5–5.0)
Alkaline Phosphatase: 97 U/L (ref 38–126)
Anion gap: 15 (ref 5–15)
BUN: 36 mg/dL — ABNORMAL HIGH (ref 6–20)
CO2: 20 mmol/L — ABNORMAL LOW (ref 22–32)
Calcium: 8.4 mg/dL — ABNORMAL LOW (ref 8.9–10.3)
Chloride: 100 mmol/L (ref 98–111)
Creatinine, Ser: 6.37 mg/dL — ABNORMAL HIGH (ref 0.44–1.00)
GFR, Estimated: 9 mL/min — ABNORMAL LOW (ref >60)
Glucose, Bld: 136 mg/dL — ABNORMAL HIGH (ref 70–99)
Potassium: 4.6 mmol/L (ref 3.5–5.1)
Sodium: 135 mmol/L (ref 135–145)
Total Bilirubin: 0.5 mg/dL (ref 0.3–1.2)
Total Protein: 6 g/dL — ABNORMAL LOW (ref 6.5–8.1)

## 2022-12-17 LAB — LIPASE, BLOOD: Lipase: 25 U/L (ref 11–51)

## 2022-12-17 MED ORDER — METOCLOPRAMIDE HCL 5 MG/ML IJ SOLN
10.0000 mg | Freq: Once | INTRAMUSCULAR | Status: AC
  Administered 2022-12-17: 10 mg via INTRAVENOUS
  Filled 2022-12-17: qty 2

## 2022-12-17 MED ORDER — DIPHENHYDRAMINE HCL 50 MG/ML IJ SOLN
12.5000 mg | Freq: Once | INTRAMUSCULAR | Status: AC
  Administered 2022-12-17: 12.5 mg via INTRAVENOUS
  Filled 2022-12-17: qty 1

## 2022-12-17 MED ORDER — HYDROMORPHONE HCL 1 MG/ML IJ SOLN
1.0000 mg | Freq: Once | INTRAMUSCULAR | Status: AC
  Administered 2022-12-17: 1 mg via INTRAVENOUS
  Filled 2022-12-17: qty 1

## 2022-12-17 NOTE — ED Provider Triage Note (Signed)
Emergency Medicine Provider Triage Evaluation Note  Kathy Lewis , a 27 y.o. female  was evaluated in triage.  Patient complains of abdominal pain.  Recently was in the emergency department for the same however when she was discharged she no longer had pain.  Her nausea and vomiting came back quickly but today the pain has returned.  Generalized.  Is a TTS dialysis patient and has not missed any sessions.  Normal bowel movements  Review of Systems  Positive:  Negative:   Physical Exam  BP (!) 152/128   Pulse (!) 125   Temp 98.8 F (37.1 C) (Oral)   Resp 20   SpO2 97%  Gen:   Awake, no distress   Resp:  Normal effort  MSK:   Moves extremities without difficulty  Other:  Mild abdominal distention  Medical Decision Making  Medically screening exam initiated at 4:44 PM.  Appropriate orders placed.  Kathy Lewis was informed that the remainder of the evaluation will be completed by another provider, this initial triage assessment does not replace that evaluation, and the importance of remaining in the ED until their evaluation is complete.     Saddie Benders, [place] 12/17/22 1648

## 2022-12-17 NOTE — ED Provider Notes (Signed)
[place] EMERGENCY DEPARTMENT AT [facility] Provider Note   CSN: [id number] Arrival date & time: 12/17/22  1631     History  Chief Complaint  Patient presents with   Abdominal Pain    Kathy Lewis is a 27 y.o. female.  Complains of nausea and vomiting.  Patient has a history of the same.  Patient reports that she has been seen by GI.  Patient has had an endoscopy she has had gastroparesis studies and CT and ultrasound  The history is provided by the patient.  Abdominal Pain Pain location:  Generalized Pain quality: aching   Pain radiates to:  Does not radiate Pain severity:  No pain Timing:  Constant Progression:  Worsening Chronicity:  New Relieved by:  Nothing Worsened by:  Nothing Ineffective treatments:  None tried Associated symptoms: nausea        Home Medications Prior to Admission medications   Medication Sig Start Date End Date Taking? Authorizing Provider  acetaminophen (TYLENOL) 500 MG tablet Take 1,000 mg by mouth as needed for headache (pain).    [provider]  amitriptyline (ELAVIL) 50 MG tablet Take 1 tablet (50 mg total) by mouth at bedtime. 08/30/22   Shanna Cisco, NP  amLODipine (NORVASC) 10 MG tablet Take 1 tablet (10 mg total) by mouth daily. 10/09/22 11/20/23  Arrien, York Ram, MD  atorvastatin (LIPITOR) 40 MG tablet Take 40 mg by mouth every evening.    [provider]  calcium acetate (PHOSLO) 667 MG capsule Take 1,334 mg by mouth 3 (three) times daily with meals. 09/28/22   [provider]  carvedilol (COREG) 12.5 MG tablet Take 1 tablet (12.5 mg total) by mouth 2 (two) times daily with a meal. 10/08/22 11/20/23  Arrien, York Ram, MD  clonazePAM (KLONOPIN) 0.5 MG tablet Take 1 tablet (0.5 mg total) by mouth 2 (two) times daily as needed for anxiety. 03/16/22   Leroy Sea, MD  hydrALAZINE (APRESOLINE) 50 MG tablet Take 1 tablet (50 mg total) by mouth in the morning and at bedtime.  10/08/22 10/08/23  Arrien, York Ram, MD  hydrOXYzine (ATARAX) 10 MG tablet Take 10 mg by mouth at bedtime.    [provider]  insulin lispro (HUMALOG) 100 UNIT/ML injection Inject 0.5 Units into the skin See admin instructions. 0.5 units per hour via insulin pump. 01/08/22   [provider]  isosorbide mononitrate (IMDUR) 30 MG 24 hr tablet Take 1 tablet (30 mg total) by mouth daily. 10/09/22 11/20/23  Arrien, York Ram, MD  magnesium oxide (MAG-OX) 400 MG tablet Take 800 mg by mouth daily.    [provider]  metoCLOPramide (REGLAN) 5 MG tablet Take 1 tablet (5 mg total) by mouth as needed for nausea or vomiting. 11/25/22   Dione Booze, MD  ondansetron (ZOFRAN) 4 MG tablet Take 4 mg by mouth as needed for nausea or vomiting.    [provider]  pantoprazole (PROTONIX) 40 MG tablet Take 1 tablet (40 mg total) by mouth daily. 10/08/22 11/20/23  Arrien, York Ram, MD  Vitamin D, Ergocalciferol, (DRISDOL) 1.25 MG (50000 UNIT) CAPS capsule Take 50,000 Units by mouth every Sunday.    [provider]  escitalopram (LEXAPRO) 10 MG tablet Take 20 mg by mouth daily.  05/10/20 10/06/20  [provider]  furosemide (LASIX) 40 MG tablet Take 1 tablet (40 mg total) by mouth daily. Patient not taking: Reported on 02/16/2021 10/28/19 02/17/21  Geryl Rankins, MD  lisinopril (ZESTRIL) 10 MG  tablet Take 10 mg by mouth daily.  02/23/20 10/06/20  [provider]  spironolactone (ALDACTONE) 25 MG tablet Take 25 mg by mouth daily.  04/20/20 10/06/20  [provider]      Allergies    Cantaloupe extract allergy skin test, Citrullus vulgaris, Food, Nsaids, and Strawberry extract    Review of Systems   Review of Systems  Gastrointestinal:  Positive for abdominal pain and nausea.  All other systems reviewed and are negative.   Physical Exam Updated Vital Signs BP 106/64 (BP Location: Right Arm)   Pulse 97   Temp 98.6 F (37 C) (Oral)    Resp 16   Ht 5' (1.524 m)   Wt 59 kg   SpO2 97%   BMI 25.40 kg/m  Physical Exam Vitals and nursing note reviewed.  Constitutional:      Appearance: She is well-developed.  HENT:     Head: Normocephalic.  Cardiovascular:     Rate and Rhythm: Normal rate.  Pulmonary:     Effort: Pulmonary effort is normal.  Abdominal:     General: Abdomen is flat. Bowel sounds are normal. There is no distension.     Palpations: Abdomen is soft.  Musculoskeletal:        General: Normal range of motion.     Cervical back: Normal range of motion.  Neurological:     Mental Status: She is alert and oriented to person, place, and time.     ED Results / Procedures / Treatments   Labs (all labs ordered are listed, but only abnormal results are displayed) Labs Reviewed  CBC WITH DIFFERENTIAL/PLATELET - Abnormal; Notable for the following components:      Result Value   RDW 17.2 (*)    All other components within normal limits  COMPREHENSIVE METABOLIC PANEL - Abnormal; Notable for the following components:   CO2 20 (*)    Glucose, Bld 136 (*)    BUN 36 (*)    Creatinine, Ser 6.37 (*)    Calcium 8.4 (*)    Total Protein 6.0 (*)    Albumin 2.3 (*)    AST 13 (*)    GFR, Estimated 9 (*)    All other components within normal limits  LIPASE, BLOOD  I-STAT BETA HCG BLOOD, ED ([facility], [facility], [facility] ONLY)    EKG None  Radiology No results found.  Procedures Procedures    Medications Ordered in ED Medications  HYDROmorphone (DILAUDID) injection 1 mg (1 mg Intravenous Given 12/17/22 1742)  metoCLOPramide (REGLAN) injection 10 mg (10 mg Intravenous Given 12/17/22 1741)  diphenhydrAMINE (BENADRYL) injection 12.5 mg (12.5 mg Intravenous Given 12/17/22 1835)    ED Course/ Medical Decision Making/ A&P                             Medical Decision Making Patient given IV Dilaudid Benadryl and Reglan.  Patient reports relief of nausea and vomiting.  Patient reports she continues to have episodes of  vomiting not relieved at home by medications  Amount and/or Complexity of Data Reviewed Independent Historian: parent    Details: And is here with her father who is supportive External Data Reviewed: notes.    Details: Nephrology notes reviewed patient is a hemodialysis patient. Labs: ordered. Decision-making details documented in ED Course.    Details: Labs ordered reviewed and interpreted.  White blood cell count is normal potassium is normal  Risk Prescription drug management. Risk Details: Patient  reports that the only medication that alleviates her symptoms is Dilaudid.  I advised patient that she needs to discuss pain management with her primary care physician or pain management.  And is asking if she needs further studies that have not been done.  I advised her to call her gastroenterologist to discuss any further evaluations they feel she may need.           Final Clinical Impression(s) / ED Diagnoses Final diagnoses:  Nausea  Vomiting, unspecified vomiting type, unspecified whether nausea present    Rx / DC Orders ED Discharge Orders     None     An After Visit Summary was printed and given to the patient.     Osie Cheeks 12/17/22 2223    Nira Conn, MD 12/18/22 [phone]

## 2022-12-17 NOTE — Discharge Instructions (Addendum)
See your Gi doctor for recheck. °

## 2022-12-17 NOTE — ED Triage Notes (Signed)
Patient BIB [facility] from home for abd pain. Patient receives dialysis T, TH, Sat, Recently seen here for same, given meds which helped the nausea and vomiting but returned once meds wore off. Pain started today around noon with movement making worse.

## 2022-12-21 ENCOUNTER — Encounter (HOSPITAL_BASED_OUTPATIENT_CLINIC_OR_DEPARTMENT_OTHER): Payer: Self-pay | Admitting: Pediatrics

## 2022-12-21 ENCOUNTER — Other Ambulatory Visit: Payer: Self-pay

## 2022-12-21 ENCOUNTER — Emergency Department (HOSPITAL_BASED_OUTPATIENT_CLINIC_OR_DEPARTMENT_OTHER)
Admission: EM | Admit: 2022-12-21 | Discharge: 2022-12-21 | Disposition: A | Discharge status: Home / Self Care | Payer: Medicaid Other | Attending: Emergency Medicine | Admitting: Emergency Medicine

## 2022-12-21 DIAGNOSIS — N186 End stage renal disease: Secondary | ICD-10-CM | POA: Diagnosis not present

## 2022-12-21 DIAGNOSIS — Z79899 Other long term (current) drug therapy: Secondary | ICD-10-CM | POA: Insufficient documentation

## 2022-12-21 DIAGNOSIS — Z794 Long term (current) use of insulin: Secondary | ICD-10-CM | POA: Insufficient documentation

## 2022-12-21 DIAGNOSIS — R112 Nausea with vomiting, unspecified: Secondary | ICD-10-CM | POA: Diagnosis not present

## 2022-12-21 DIAGNOSIS — Z992 Dependence on renal dialysis: Secondary | ICD-10-CM | POA: Diagnosis not present

## 2022-12-21 DIAGNOSIS — E1122 Type 2 diabetes mellitus with diabetic chronic kidney disease: Secondary | ICD-10-CM | POA: Diagnosis not present

## 2022-12-21 DIAGNOSIS — R1013 Epigastric pain: Secondary | ICD-10-CM | POA: Insufficient documentation

## 2022-12-21 DIAGNOSIS — I509 Heart failure, unspecified: Secondary | ICD-10-CM | POA: Diagnosis not present

## 2022-12-21 DIAGNOSIS — R110 Nausea: Secondary | ICD-10-CM

## 2022-12-21 LAB — CBC WITH DIFFERENTIAL/PLATELET
Abs Immature Granulocytes: 0.01 10*3/uL (ref 0.00–0.07)
Basophils Absolute: 0 10*3/uL (ref 0.0–0.1)
Basophils Relative: 1 %
Eosinophils Absolute: 0.1 10*3/uL (ref 0.0–0.5)
Eosinophils Relative: 2 %
HCT: 40.9 % (ref 36.0–46.0)
Hemoglobin: 13.1 g/dL (ref 12.0–15.0)
Immature Granulocytes: 0 %
Lymphocytes Relative: 16 %
Lymphs Abs: 0.7 10*3/uL (ref 0.7–4.0)
MCH: 30.5 pg (ref 26.0–34.0)
MCHC: 32 g/dL (ref 30.0–36.0)
MCV: 95.3 fL (ref 80.0–100.0)
Monocytes Absolute: 0.4 10*3/uL (ref 0.1–1.0)
Monocytes Relative: 7 %
Neutro Abs: 3.5 10*3/uL (ref 1.7–7.7)
Neutrophils Relative %: 74 %
Platelets: 312 10*3/uL (ref 150–400)
RBC: 4.29 MIL/uL (ref 3.87–5.11)
RDW: 16.7 % — ABNORMAL HIGH (ref 11.5–15.5)
WBC: 4.7 10*3/uL (ref 4.0–10.5)
nRBC: 0 % (ref 0.0–0.2)

## 2022-12-21 MED ORDER — DIPHENHYDRAMINE HCL 50 MG/ML IJ SOLN
12.5000 mg | Freq: Once | INTRAMUSCULAR | Status: AC
  Administered 2022-12-21: 12.5 mg via INTRAVENOUS
  Filled 2022-12-21: qty 1

## 2022-12-21 MED ORDER — METOCLOPRAMIDE HCL 5 MG/ML IJ SOLN
10.0000 mg | Freq: Once | INTRAMUSCULAR | Status: AC
  Administered 2022-12-21: 10 mg via INTRAVENOUS
  Filled 2022-12-21: qty 2

## 2022-12-21 NOTE — ED Provider Notes (Signed)
[place] [facility] AT [facility] Provider Note   CSN: [id number] Arrival date & time: 12/21/22  1637     History  Chief Complaint  Patient presents with   Abdominal Pain    Kathy Lewis is a 27 y.o. female.   Abdominal Pain 27 year old female with ESRD on HD (previously on PD and has PD catheter removal surgery on 4/29), T1DM, CHF who presents with generalized abdominal discomfort with nausea/vomiting which is nonbloody in nature.  She has Reglan at home which does not improve her symptoms.  Denies fever or change in bowel habits.  She does have a GI doctor but does not currently have an appointment with them.  Denies alcohol/smoking/drug use.  She did go to her dialysis session today.  Patient presented with these same symptoms on 4/18 and 4/21.  Notes that this current episode is no different than prior episodes.  She states the only medication that is really able to work for her is Dilaudid and is able to hold her over for 3 to 4 days.  She is currently seeking a referral by PCP to see pain management specialist.     Home Medications Prior to Admission medications   Medication Sig Start Date End Date Taking? Authorizing Provider  acetaminophen (TYLENOL) 500 MG tablet Take 1,000 mg by mouth as needed for headache (pain).    [provider]  amitriptyline (ELAVIL) 50 MG tablet Take 1 tablet (50 mg total) by mouth at bedtime. 08/30/22   Shanna Cisco, NP  amLODipine (NORVASC) 10 MG tablet Take 1 tablet (10 mg total) by mouth daily. 10/09/22 11/20/23  Arrien, York Ram, MD  atorvastatin (LIPITOR) 40 MG tablet Take 40 mg by mouth every evening.    [provider]  calcium acetate (PHOSLO) 667 MG capsule Take 1,334 mg by mouth 3 (three) times daily with meals. 09/28/22   [provider]  carvedilol (COREG) 12.5 MG tablet Take 1 tablet (12.5 mg total) by mouth 2 (two) times daily with a meal. 10/08/22 11/20/23  Arrien, York Ram, MD  clonazePAM (KLONOPIN) 0.5 MG tablet Take 1 tablet (0.5 mg total) by mouth 2 (two) times daily as needed for anxiety. 03/16/22   Leroy Sea, MD  hydrALAZINE (APRESOLINE) 50 MG tablet Take 1 tablet (50 mg total) by mouth in the morning and at bedtime. 10/08/22 10/08/23  Arrien, York Ram, MD  hydrOXYzine (ATARAX) 10 MG tablet Take 10 mg by mouth at bedtime.    [provider]  insulin lispro (HUMALOG) 100 UNIT/ML injection Inject 0.5 Units into the skin See admin instructions. 0.5 units per hour via insulin pump. 01/08/22   [provider]  isosorbide mononitrate (IMDUR) 30 MG 24 hr tablet Take 1 tablet (30 mg total) by mouth daily. 10/09/22 11/20/23  Arrien, York Ram, MD  magnesium oxide (MAG-OX) 400 MG tablet Take 800 mg by mouth daily.    [provider]  metoCLOPramide (REGLAN) 5 MG tablet Take 1 tablet (5 mg total) by mouth as needed for nausea or vomiting. 11/25/22   Dione Booze, MD  ondansetron (ZOFRAN) 4 MG tablet Take 4 mg by mouth as needed for nausea or vomiting.    [provider]  pantoprazole (PROTONIX) 40 MG tablet Take 1 tablet (40 mg total) by mouth daily. 10/08/22 11/20/23  Arrien, York Ram, MD  Vitamin D, Ergocalciferol, (DRISDOL) 1.25 MG (50000 UNIT) CAPS capsule Take 50,000 Units by mouth every Sunday.    [provider]  escitalopram (LEXAPRO) 10 MG tablet Take 20 mg by mouth daily.  05/10/20 10/06/20  [provider]  furosemide (LASIX) 40 MG tablet Take 1 tablet (40 mg total) by mouth daily. Patient not taking: Reported on 02/16/2021 10/28/19 02/17/21  Geryl Rankins, MD  lisinopril (ZESTRIL) 10 MG tablet Take 10 mg by mouth daily.  02/23/20 10/06/20  [provider]  spironolactone (ALDACTONE) 25 MG tablet Take 25 mg by mouth daily.  04/20/20 10/06/20  [provider]      Allergies    Cantaloupe extract allergy skin test, Citrullus vulgaris, Food, Nsaids, and Strawberry extract     Review of Systems   Review of Systems  Gastrointestinal:  Positive for abdominal pain.    Physical Exam Updated Vital Signs BP 108/88   Pulse 94   Temp 98 F (36.7 C) (Oral)   Resp 20   Ht 5' (1.524 m)   Wt 56.7 kg   SpO2 98%   BMI 24.41 kg/m  Physical Exam Constitutional:      General: She is not in acute distress.    Appearance: She is not ill-appearing.  Cardiovascular:     Rate and Rhythm: Normal rate and regular rhythm.  Pulmonary:     Effort: Pulmonary effort is normal.  Abdominal:     General: Abdomen is flat. A surgical scar is present. Bowel sounds are normal.     Palpations: Abdomen is soft.     Tenderness: There is generalized abdominal tenderness and tenderness in the epigastric area. There is no guarding. Negative signs include Murphy's sign.     Hernia: No hernia is present.     Comments: PD tube in place  Neurological:     General: No focal deficit present.     Mental Status: She is alert and oriented to person, place, and time.  Psychiatric:        Mood and Affect: Mood normal.        Behavior: Behavior normal.     ED Results / Procedures / Treatments   Labs (all labs ordered are listed, but only abnormal results are displayed) Labs Reviewed  CBC WITH DIFFERENTIAL/PLATELET - Abnormal; Notable for the following components:      Result Value   RDW 16.7 (*)    All other components within normal limits    EKG None  Radiology No results found.  Procedures Procedures    Medications Ordered in ED Medications  metoCLOPramide (REGLAN) injection 10 mg (10 mg Intravenous Given 12/21/22 1849)  diphenhydrAMINE (BENADRYL) injection 12.5 mg (12.5 mg Intravenous Given 12/21/22 1849)    ED Course/ Medical Decision Making/ A&P                             Medical Decision Making Amount and/or Complexity of Data Reviewed Labs: ordered.  Risk Prescription drug management.  Patient given Benadryl and Reglan.  Opted to avoid Dilaudid at this  time.  Noted symptomatic improvement and amenable to discharge. Advised to follow up with GI regarding cyclical generalized abdominal pain with vomiting. Continue to discuss pain with PCP or referral to pain management.        Final Clinical Impression(s) / ED Diagnoses Final diagnoses:  Epigastric pain  Nausea    Rx / DC Orders ED Discharge Orders     None         Shelby Mattocks, DO 12/21/22 1946    Maia Plan, MD 01/02/23 1354

## 2022-12-21 NOTE — ED Triage Notes (Signed)
C/O NV + chronic abdominal pain. HD T, TH , Sat., reports had a dialysis session today.

## 2022-12-21 NOTE — Discharge Instructions (Signed)
He is continue to follow with your primary care doctors and your GI doctors.  Return with any new or suddenly worsening symptoms.

## 2023-01-11 ENCOUNTER — Emergency Department (HOSPITAL_COMMUNITY): Payer: Medicaid Other

## 2023-01-11 ENCOUNTER — Emergency Department (HOSPITAL_COMMUNITY)
Admission: EM | Admit: 2023-01-11 | Discharge: 2023-01-11 | Disposition: A | Discharge status: Home / Self Care | Payer: Medicaid Other | Attending: Student | Admitting: Student

## 2023-01-11 ENCOUNTER — Other Ambulatory Visit: Payer: Self-pay

## 2023-01-11 DIAGNOSIS — Z79899 Other long term (current) drug therapy: Secondary | ICD-10-CM | POA: Insufficient documentation

## 2023-01-11 DIAGNOSIS — Z992 Dependence on renal dialysis: Secondary | ICD-10-CM | POA: Diagnosis not present

## 2023-01-11 DIAGNOSIS — Z794 Long term (current) use of insulin: Secondary | ICD-10-CM | POA: Insufficient documentation

## 2023-01-11 DIAGNOSIS — E875 Hyperkalemia: Secondary | ICD-10-CM | POA: Insufficient documentation

## 2023-01-11 DIAGNOSIS — R000 Tachycardia, unspecified: Secondary | ICD-10-CM | POA: Insufficient documentation

## 2023-01-11 DIAGNOSIS — E1022 Type 1 diabetes mellitus with diabetic chronic kidney disease: Secondary | ICD-10-CM | POA: Insufficient documentation

## 2023-01-11 DIAGNOSIS — Z87891 Personal history of nicotine dependence: Secondary | ICD-10-CM | POA: Insufficient documentation

## 2023-01-11 DIAGNOSIS — Z8616 Personal history of COVID-19: Secondary | ICD-10-CM | POA: Insufficient documentation

## 2023-01-11 DIAGNOSIS — I5022 Chronic systolic (congestive) heart failure: Secondary | ICD-10-CM | POA: Insufficient documentation

## 2023-01-11 DIAGNOSIS — I132 Hypertensive heart and chronic kidney disease with heart failure and with stage 5 chronic kidney disease, or end stage renal disease: Secondary | ICD-10-CM | POA: Insufficient documentation

## 2023-01-11 DIAGNOSIS — R1032 Left lower quadrant pain: Secondary | ICD-10-CM | POA: Diagnosis present

## 2023-01-11 DIAGNOSIS — N186 End stage renal disease: Secondary | ICD-10-CM | POA: Insufficient documentation

## 2023-01-11 DIAGNOSIS — N1330 Unspecified hydronephrosis: Secondary | ICD-10-CM | POA: Insufficient documentation

## 2023-01-11 DIAGNOSIS — I160 Hypertensive urgency: Secondary | ICD-10-CM | POA: Diagnosis not present

## 2023-01-11 LAB — I-STAT BETA HCG BLOOD, ED (MC, WL, AP ONLY): I-stat hCG, quantitative: <5 m[IU]/mL (ref <5)

## 2023-01-11 LAB — COMPREHENSIVE METABOLIC PANEL
ALT: 5 U/L (ref 0–44)
AST: 16 U/L (ref 15–41)
Albumin: 2.3 g/dL — ABNORMAL LOW (ref 3.5–5.0)
Alkaline Phosphatase: 94 U/L (ref 38–126)
Anion gap: 13 (ref 5–15)
BUN: 42 mg/dL — ABNORMAL HIGH (ref 6–20)
CO2: 17 mmol/L — ABNORMAL LOW (ref 22–32)
Calcium: 7.5 mg/dL — ABNORMAL LOW (ref 8.9–10.3)
Chloride: 105 mmol/L (ref 98–111)
Creatinine, Ser: 6.93 mg/dL — ABNORMAL HIGH (ref 0.44–1.00)
GFR, Estimated: 8 mL/min — ABNORMAL LOW (ref >60)
Glucose, Bld: 262 mg/dL — ABNORMAL HIGH (ref 70–99)
Potassium: 5.4 mmol/L — ABNORMAL HIGH (ref 3.5–5.1)
Sodium: 135 mmol/L (ref 135–145)
Total Bilirubin: 0.6 mg/dL (ref 0.3–1.2)
Total Protein: 5.9 g/dL — ABNORMAL LOW (ref 6.5–8.1)

## 2023-01-11 LAB — CBC WITH DIFFERENTIAL/PLATELET
Abs Immature Granulocytes: 0.02 10*3/uL (ref 0.00–0.07)
Basophils Absolute: 0 10*3/uL (ref 0.0–0.1)
Basophils Relative: 0 %
Eosinophils Absolute: 0.1 10*3/uL (ref 0.0–0.5)
Eosinophils Relative: 2 %
HCT: 42.2 % (ref 36.0–46.0)
Hemoglobin: 12.6 g/dL (ref 12.0–15.0)
Immature Granulocytes: 0 %
Lymphocytes Relative: 16 %
Lymphs Abs: 0.8 10*3/uL (ref 0.7–4.0)
MCH: 29.9 pg (ref 26.0–34.0)
MCHC: 29.9 g/dL — ABNORMAL LOW (ref 30.0–36.0)
MCV: 100 fL (ref 80.0–100.0)
Monocytes Absolute: 0.3 10*3/uL (ref 0.1–1.0)
Monocytes Relative: 6 %
Neutro Abs: 4 10*3/uL (ref 1.7–7.7)
Neutrophils Relative %: 76 %
Platelets: 236 10*3/uL (ref 150–400)
RBC: 4.22 MIL/uL (ref 3.87–5.11)
RDW: 16.2 % — ABNORMAL HIGH (ref 11.5–15.5)
WBC: 5.3 10*3/uL (ref 4.0–10.5)
nRBC: 0 % (ref 0.0–0.2)

## 2023-01-11 LAB — LIPASE, BLOOD: Lipase: 26 U/L (ref 11–51)

## 2023-01-11 MED ORDER — ONDANSETRON HCL 4 MG/2ML IJ SOLN
4.0000 mg | Freq: Once | INTRAMUSCULAR | Status: AC
  Administered 2023-01-11: 4 mg via INTRAVENOUS
  Filled 2023-01-11: qty 2

## 2023-01-11 MED ORDER — LORAZEPAM 2 MG/ML IJ SOLN
1.0000 mg | Freq: Once | INTRAMUSCULAR | Status: AC
  Administered 2023-01-11: 1 mg via INTRAVENOUS
  Filled 2023-01-11: qty 1

## 2023-01-11 MED ORDER — ACETAMINOPHEN 325 MG PO TABS
650.0000 mg | ORAL_TABLET | Freq: Once | ORAL | Status: AC
  Administered 2023-01-11: 650 mg via ORAL
  Filled 2023-01-11: qty 2

## 2023-01-11 MED ORDER — FENTANYL CITRATE PF 50 MCG/ML IJ SOSY
50.0000 ug | PREFILLED_SYRINGE | Freq: Once | INTRAMUSCULAR | Status: AC
  Administered 2023-01-11: 50 ug via INTRAVENOUS
  Filled 2023-01-11: qty 1

## 2023-01-11 MED ORDER — OXYCODONE HCL 5 MG PO TABS: 5.0000 mg | ORAL_TABLET | Freq: Four times a day (QID) | ORAL | 0 refills | Status: DC | PRN

## 2023-01-11 MED ORDER — SODIUM ZIRCONIUM CYCLOSILICATE 10 G PO PACK
10.0000 g | PACK | Freq: Once | ORAL | Status: AC
  Administered 2023-01-11: 10 g via ORAL
  Filled 2023-01-11: qty 1

## 2023-01-11 MED ORDER — DROPERIDOL 2.5 MG/ML IJ SOLN: 1.2500 mg | Freq: Once | INTRAMUSCULAR | Status: DC

## 2023-01-11 MED ORDER — CARVEDILOL 12.5 MG PO TABS
12.5000 mg | ORAL_TABLET | Freq: Once | ORAL | Status: AC
  Administered 2023-01-11: 12.5 mg via ORAL
  Filled 2023-01-11: qty 1

## 2023-01-11 NOTE — ED Provider Notes (Signed)
Patient signed out to me at 0700 by Dr. Posey Rea pending labs and CT scan.  In short this is a 27 year old female with a past medical history of ESRD on HD with prior PAD status post recent catheter removal, CHF, diabetes presenting to the emergency department with acute on chronic abdominal pain.  Per the patient she woke up just prior to arrival with left lower quadrant abdominal pain.  She received Zofran and Ativan for her nausea and for anxiolysis.  At time of signout, the patient continuing to complain of pain.  She points her left lower quadrant.  Her abdomen is soft and minimally tender without any rebound or guarding.  She is tachycardic and otherwise hemodynamically stable.  She will be given additional fentanyl and Tylenol for pain and will be closely reassessed.  Clinical Course as of 01/11/23 1006  Thu Jan 11, 2023  4098 CTAP with L-sided hydronephrosis, no obvious stone, recommended ultrasound to evaluate for further for focal obstructing lesions. Ascites likely 2/2 recent PD catheter removal. [VK]  0942 No evidence of obstruction on renal US. Patient recommended outpatient urology follow up. She reports making urine only a few times a week, bladder empty on [place]. She does remain tachycardic but without significant pain resting comfortably in the room, no signs of infection. May be related to missing BB today or some mild volume overload. She is due for HD today. She is otherwise stable for discharge with outpatient follow up. [VK]    Clinical Course User Index [VK] Rexford Maus, DO      Rexford Maus, [place] 01/11/23 1006

## 2023-01-11 NOTE — Discharge Instructions (Addendum)
You were seen in the emergency department for your abdominal pain.  You do have some backup of urine into your kidney on the left side which may be causing your pain.  You no obvious signs of stones or was causing the blockage.  You can take Tylenol every 6 hours as needed for pain and I have given you some oxycodone for breakthrough pain.  You can follow-up with urology to have your symptoms rechecked.  Your potassium level was slightly high today and we did give you some Lokelma though you should have your dialysis session today as planned.  You should return to the emergency department for having fevers, significantly worsening pain, repetitive vomiting or if you have any other new or concerning symptoms.

## 2023-01-11 NOTE — ED Notes (Signed)
Requesting dilaudid before going home

## 2023-01-11 NOTE — ED Triage Notes (Signed)
Patient BIB EMS for evaluation of chronic abdominal pain. Pt reports pain started two hours prior to arrival.  C/o pain coming "in waves."  + Nausea.  Dialysis T, Th, Sat.  Pt moaning continuously in pain on arrival

## 2023-01-11 NOTE — ED Provider Notes (Signed)
[place] EMERGENCY DEPARTMENT AT [facility] Provider Note  CSN: [id number] Arrival date & time: 01/11/23 0542  Chief Complaint(s) Abdominal Pain  HPI Kathy Lewis is a 27 y.o. female with PMH bipolar 2, prolonged QT, type 1 diabetes, ESRD previously on peritoneal dialysis with peritoneal dialysis catheter removal on 12/25/2022, now on hemodialysis via tunneled catheter, chronic abdominal pain who presents emergency department for evaluation of abdominal pain nausea vomiting.  Patient states that she abruptly woke up with left lower quadrant abdominal pain and associated nausea.  She arrives moaning and rolling around in the bed.  When medical staff are not in the room patient appears well-appearing and does not appear in significant distress.  She denies fever, chest pain, shortness of breath, headache or other systemic symptoms.   Past Medical History Past Medical History:  Diagnosis Date   Anemia    Anxiety    Bipolar 2 disorder (HCC)    Chronic kidney disease    Chronic systolic (congestive) heart failure (HCC)    Depression    DKA (diabetic ketoacidoses)    ESRD on peritoneal dialysis (HCC)    HSV infection    on valtrex   Hypokalemia    Leukocytosis    Migraine    Noncompliance with medication regimen    Preeclampsia    Prolonged QT syndrome    Severe anemia    Type 1 diabetes mellitus (HCC)    Patient Active Problem List   Diagnosis Date Noted   COVID-19 virus infection 10/21/2022   Seizure-like activity (HCC) 10/04/2022   Intractable vomiting with nausea 10/04/2022   Cyclical vomiting with nausea 09/12/2022   Acute systolic heart failure (HCC) 09/12/2022   Acute on chronic systolic heart failure (HCC) 09/12/2022   Metabolic acidosis, increased anion gap 07/28/2022   Elevated LFTs 07/28/2022   Hyperglycemia 06/30/2022   DKA (diabetic ketoacidosis) (HCC) 06/28/2022   Hypertensive urgency 06/27/2022   Type 1 diabetes (HCC) 06/26/2022   Nausea and  vomiting 03/15/2022   Mixed diabetic hyperlipidemia associated with type 1 diabetes mellitus (HCC) 01/31/2022   Intractable hiccups 01/31/2022   Abdominal pain with vomiting 01/24/2022   Uncontrolled type 1 diabetes mellitus with hyperglycemia, with long-term current use of insulin (HCC) 01/24/2022   ESRD on hemodialysis (HCC) 11/23/2021   Abnormal CT of the chest 11/23/2021   Gastroparesis 11/22/2021   Pulmonary embolism (HCC) 11/22/2021   Hyperkalemia 08/11/2021   Bipolar 2 disorder (HCC)    Essential hypertension    Chronic systolic CHF (congestive heart failure) (HCC) 07/11/2020   Hypoalbuminemia    Anxiety    Normocytic anemia    Prolonged QT interval    Dyslipidemia 05/18/2020   Preeclampsia 10/15/2019   Hypokalemia    Noncompliance with medications 12/18/2014   Intractable nausea and vomiting 12/18/2014   Home Medication(s) Prior to Admission medications   Medication Sig Start Date End Date Taking? Authorizing Provider  oxyCODONE (ROXICODONE) 5 MG immediate release tablet Take 1 tablet (5 mg total) by mouth every 6 (six) hours as needed for severe pain. 01/11/23  Yes Elayne Snare K, DO  acetaminophen (TYLENOL) 500 MG tablet Take 1,000 mg by mouth as needed for headache (pain).    [provider]  amitriptyline (ELAVIL) 50 MG tablet Take 1 tablet (50 mg total) by mouth at bedtime. 08/30/22   Shanna Cisco, NP  amLODipine (NORVASC) 10 MG tablet Take 1 tablet (10 mg total) by mouth daily. 10/09/22 11/20/23  Arrien, York Ram, MD  atorvastatin (LIPITOR)  40 MG tablet Take 40 mg by mouth every evening.    [provider]  calcium acetate (PHOSLO) 667 MG capsule Take 1,334 mg by mouth 3 (three) times daily with meals. 09/28/22   [provider]  carvedilol (COREG) 12.5 MG tablet Take 1 tablet (12.5 mg total) by mouth 2 (two) times daily with a meal. 10/08/22 11/20/23  Arrien, York Ram, MD  clonazePAM (KLONOPIN) 0.5 MG tablet Take 1 tablet  (0.5 mg total) by mouth 2 (two) times daily as needed for anxiety. 03/16/22   Leroy Sea, MD  hydrALAZINE (APRESOLINE) 50 MG tablet Take 1 tablet (50 mg total) by mouth in the morning and at bedtime. 10/08/22 10/08/23  Arrien, York Ram, MD  hydrOXYzine (ATARAX) 10 MG tablet Take 10 mg by mouth at bedtime.    [provider]  insulin lispro (HUMALOG) 100 UNIT/ML injection Inject 0.5 Units into the skin See admin instructions. 0.5 units per hour via insulin pump. 01/08/22   [provider]  isosorbide mononitrate (IMDUR) 30 MG 24 hr tablet Take 1 tablet (30 mg total) by mouth daily. 10/09/22 11/20/23  Arrien, York Ram, MD  magnesium oxide (MAG-OX) 400 MG tablet Take 800 mg by mouth daily.    [provider]  metoCLOPramide (REGLAN) 5 MG tablet Take 1 tablet (5 mg total) by mouth as needed for nausea or vomiting. 11/25/22   Dione Booze, MD  ondansetron (ZOFRAN) 4 MG tablet Take 4 mg by mouth as needed for nausea or vomiting.    [provider]  pantoprazole (PROTONIX) 40 MG tablet Take 1 tablet (40 mg total) by mouth daily. 10/08/22 11/20/23  Arrien, York Ram, MD  Vitamin D, Ergocalciferol, (DRISDOL) 1.25 MG (50000 UNIT) CAPS capsule Take 50,000 Units by mouth every Sunday.    [provider]  escitalopram (LEXAPRO) 10 MG tablet Take 20 mg by mouth daily.  05/10/20 10/06/20  [provider]  furosemide (LASIX) 40 MG tablet Take 1 tablet (40 mg total) by mouth daily. Patient not taking: Reported on 02/16/2021 10/28/19 02/17/21  Geryl Rankins, MD  lisinopril (ZESTRIL) 10 MG tablet Take 10 mg by mouth daily.  02/23/20 10/06/20  [provider]  spironolactone (ALDACTONE) 25 MG tablet Take 25 mg by mouth daily.  04/20/20 10/06/20  [provider]                                                                                                                                    Past Surgical History Past Surgical History:   Procedure Laterality Date   BIOPSY  04/24/2022   Procedure: BIOPSY;  Surgeon: Jenel Lucks, MD;  Location: [facility] ENDOSCOPY;  Service: Gastroenterology;;   CARDIAC CATHETERIZATION     COLONOSCOPY WITH PROPOFOL N/A 04/24/2022   Procedure: COLONOSCOPY WITH PROPOFOL;  Surgeon: Jenel Lucks, MD;  Location: [facility] ENDOSCOPY;  Service: Gastroenterology;  Laterality: N/A;   DILATION AND EVACUATION  N/A 10/22/2019   Procedure: ULTRASOUND GUIDED DILATATION AND EVACUATION;  Surgeon: Geryl Rankins, MD;  Location: [facility] LD ORS;  Service: Gynecology;  Laterality: N/A;   ESOPHAGOGASTRODUODENOSCOPY (EGD) WITH PROPOFOL N/A 04/24/2022   Procedure: ESOPHAGOGASTRODUODENOSCOPY (EGD) WITH PROPOFOL;  Surgeon: Jenel Lucks, MD;  Location: [facility] ENDOSCOPY;  Service: Gastroenterology;  Laterality: N/A;   IR FLUORO GUIDE CV LINE RIGHT  09/14/2022   IR US GUIDE VASC ACCESS RIGHT  09/14/2022   peritoneal dialysis catheter insertion     RENAL BIOPSY     Family History Family History  Adopted: Yes  Problem Relation Age of Onset   Heart disease Neg Hx     Social History Social History   Tobacco Use   Smoking status: Former    Packs/day: 1.00    Years: 2.00    Additional pack years: 0.00    Total pack years: 2.00    Types: Cigars, Cigarettes   Smokeless tobacco: Never  Vaping Use   Vaping Use: Never used  Substance Use Topics   Alcohol use: No   Drug use: No   Allergies Cantaloupe extract allergy skin test, Citrullus vulgaris, Food, Nsaids, and Strawberry extract  Review of Systems Review of Systems  Gastrointestinal:  Positive for abdominal pain, nausea and vomiting.    Physical Exam Vital Signs  I have reviewed the triage vital signs BP (!) 166/133 (BP Location: Right Leg)   Pulse (!) 118   Temp (!) 97.4 F (36.3 C) (Axillary)   Resp (!) 22   Ht 5' (1.524 m)   Wt 56.7 kg   SpO2 100%   BMI 24.41 kg/m   Physical Exam Vitals and nursing note reviewed.  Constitutional:       General: She is not in acute distress.    Appearance: She is well-developed.  HENT:     Head: Normocephalic and atraumatic.  Eyes:     Conjunctiva/sclera: Conjunctivae normal.  Cardiovascular:     Rate and Rhythm: Normal rate and regular rhythm.     Heart sounds: No murmur heard. Pulmonary:     Effort: Pulmonary effort is normal. No respiratory distress.     Breath sounds: Normal breath sounds.  Abdominal:     General: There is distension.     Palpations: Abdomen is soft.     Tenderness: There is generalized abdominal tenderness.  Musculoskeletal:        General: No swelling.     Cervical back: Neck supple.  Skin:    General: Skin is warm and dry.     Capillary Refill: Capillary refill takes less than 2 seconds.  Neurological:     Mental Status: She is alert.  Psychiatric:        Mood and Affect: Mood normal.     ED Results and Treatments Labs (all labs ordered are listed, but only abnormal results are displayed) Labs Reviewed  CBC WITH DIFFERENTIAL/PLATELET - Abnormal; Notable for the following components:      Result Value   MCHC 29.9 (*)    RDW 16.2 (*)    All other components within normal limits  COMPREHENSIVE METABOLIC PANEL - Abnormal; Notable for the following components:   Potassium 5.4 (*)    CO2 17 (*)    Glucose, Bld 262 (*)    BUN 42 (*)    Creatinine, Ser 6.93 (*)    Calcium 7.5 (*)    Total Protein 5.9 (*)    Albumin 2.3 (*)    GFR, Estimated 8 (*)  All other components within normal limits  LIPASE, BLOOD  I-STAT BETA HCG BLOOD, ED ([facility], [facility], [facility] ONLY)                                                                                                                          Radiology US RENAL  Result Date: 01/11/2023 CLINICAL DATA:  27 year old female with left hydronephrosis, hydroureter on CT this morning. Subsequent encounter. EXAM: RENAL / URINARY TRACT ULTRASOUND COMPLETE COMPARISON:  CT Abdomen and Pelvis 0750 hours. FINDINGS: Right Kidney:  Renal measurements: 9.2 x 4.4 x 5.0 cm = volume: 106 mL. No right hydronephrosis. Echogenic right renal cortex (image 5). No right renal mass. Left Kidney: Renal measurements: 9.0 x 5.0 x 5.0 cm = volume: 118 mL. Left hydronephrosis (image 25), stable from earlier today. Left renal cortex is echogenic on image 23. No left renal mass. Proximal left hydroureter again noted. Bladder: Diminutive.  No abnormality identified. Other: Ascites redemonstrated. IMPRESSION: 1. Left hydronephrosis unchanged from CT this morning. No obstructing etiology evident by ultrasound. 2. Evidence of underlying chronic medical renal disease. 3. Ascites. Electronically Signed   By: Odessa Fleming M.D.   On: 01/11/2023 09:24   CT ABDOMEN PELVIS WO CONTRAST  Result Date: 01/11/2023 CLINICAL DATA:  Acute on chronic intermittent abdominal pain associated with nausea EXAM: CT ABDOMEN AND PELVIS WITHOUT CONTRAST TECHNIQUE: Multidetector CT imaging of the abdomen and pelvis was performed following the standard protocol without IV contrast. RADIATION DOSE REDUCTION: This exam was performed according to the departmental dose-optimization program which includes automated exposure control, adjustment of the mA and/or kV according to patient size and/or use of iterative reconstruction technique. COMPARISON:  CT abdomen and pelvis dated 08/11/2022 FINDINGS: Lower chest: Partially imaged central venous catheter terminates in the lower right atrium. Subsegmental left lower lobe atelectasis. No pleural effusion or pneumothorax demonstrated. Multichamber cardiomegaly. Small pericardial effusion. Hepatobiliary: No focal hepatic lesions. No intra or extrahepatic biliary ductal dilation. Normal gallbladder. Pancreas: No focal lesions or main ductal dilation. Spleen: Normal in size without focal abnormality. Adrenals/Urinary Tract: No adrenal nodules. New mild left hydroureteronephrosis the level of the ureterovesical junction. Urinary bladder is underdistended.  No focal bladder wall thickening. Stomach/Bowel: Normal appearance of the stomach. No evidence of bowel wall thickening, distention, or inflammatory changes. Normal appendix. Vascular/Lymphatic: No significant vascular findings are present. No enlarged abdominal or pelvic lymph nodes. Reproductive: No adnexal masses. Other: Interval removal of peritoneal catheter. Small to moderate volume free fluid. No free air or fluid collection. Musculoskeletal: No acute or abnormal lytic or blastic osseous lesions. IMPRESSION: 1. New mild left hydroureteronephrosis to the level of the ureterovesical junction. No radiodense stones. No definite focal bladder wall thickening in the setting of bladder underdistention. Consider correlation with ultrasound examination for evaluation of focal lesion. 2. Interval removal of peritoneal catheter with small to moderate volume free fluid. 3. Multichamber cardiomegaly with small pericardial effusion. Electronically Signed   By: Milus Height.D.  On: 01/11/2023 08:23    Pertinent labs & imaging results that were available during my care of the patient were reviewed by me and considered in my medical decision making (see MDM for details).  Medications Ordered in ED Medications  LORazepam (ATIVAN) injection 1 mg (1 mg Intravenous Given 01/11/23 0623)  ondansetron (ZOFRAN) injection 4 mg (4 mg Intravenous Given 01/11/23 4098)  acetaminophen (TYLENOL) tablet 650 mg (650 mg Oral Given 01/11/23 0818)  fentaNYL (SUBLIMAZE) injection 50 mcg (50 mcg Intravenous Given 01/11/23 0818)  sodium zirconium cyclosilicate (LOKELMA) packet 10 g (10 g Oral Given 01/11/23 1116)  carvedilol (COREG) tablet 12.5 mg (12.5 mg Oral Given 01/11/23 1117)                                                                                                                                     Procedures Procedures  (including critical care time)  Medical Decision Making / ED Course   This patient presents to the  ED for concern of abdominal pain, this involves an extensive number of treatment options, and is a complaint that carries with it a high risk of complications and morbidity.  The differential diagnosis includes diverticulitis, epiploic appendagitis, colitis, gastroenteritis, constipation, nephrolithiasis, inflammatory bowel disease,  MDM: Patient seen emergency room for evaluation of abdominal pain.  Physical exam with abdominal distention and mild tenderness in left lower quadrant but is otherwise unremarkable.  At time of signout, patient pending laboratory evaluation and CT imaging.  Please see provider signout for continuation of workup.   Additional history obtained:  -External records from outside source obtained and reviewed including: Chart review including previous notes, labs, imaging, consultation notes   Lab Tests: -I ordered, reviewed, and interpreted labs.   The pertinent results include:   Labs Reviewed  CBC WITH DIFFERENTIAL/PLATELET - Abnormal; Notable for the following components:      Result Value   MCHC 29.9 (*)    RDW 16.2 (*)    All other components within normal limits  COMPREHENSIVE METABOLIC PANEL - Abnormal; Notable for the following components:   Potassium 5.4 (*)    CO2 17 (*)    Glucose, Bld 262 (*)    BUN 42 (*)    Creatinine, Ser 6.93 (*)    Calcium 7.5 (*)    Total Protein 5.9 (*)    Albumin 2.3 (*)    GFR, Estimated 8 (*)    All other components within normal limits  LIPASE, BLOOD  I-STAT BETA HCG BLOOD, ED ([facility], [facility], [facility] ONLY)      EKG   EKG Interpretation  Date/Time:  Thursday Jan 11 2023 06:35:01 EDT Ventricular Rate:  111 PR Interval:  136 QRS Duration: 98 QT Interval:  373 QTC Calculation: 507 R Axis:   182 Text Interpretation: Sinus tachycardia Probable left atrial enlargement Prolonged QT interval Confirmed by Nandan Willems ([id number]) on 01/11/2023 6:36:12 AM  Imaging Studies ordered: I ordered imaging studies including CT  abdomen pelvis and this is pending   Medicines ordered and prescription drug management: Meds ordered this encounter  Medications   LORazepam (ATIVAN) injection 1 mg   DISCONTD: droperidol (INAPSINE) 2.5 MG/ML injection 1.25 mg   ondansetron (ZOFRAN) injection 4 mg   acetaminophen (TYLENOL) tablet 650 mg   fentaNYL (SUBLIMAZE) injection 50 mcg   sodium zirconium cyclosilicate (LOKELMA) packet 10 g   carvedilol (COREG) tablet 12.5 mg   oxyCODONE (ROXICODONE) 5 MG immediate release tablet    Sig: Take 1 tablet (5 mg total) by mouth every 6 (six) hours as needed for severe pain.    Dispense:  7 tablet    Refill:  0    -I have reviewed the patients home medicines and have made adjustments as needed  Critical interventions none   Cardiac Monitoring: The patient was maintained on a cardiac monitor.  I personally viewed and interpreted the cardiac monitored which showed an underlying rhythm of: NSR  Social Determinants of Health:  Factors impacting patients care include: none   Reevaluation: After the interventions noted above, I reevaluated the patient and found that they have :stayed the same  Co morbidities that complicate the patient evaluation  Past Medical History:  Diagnosis Date   Anemia    Anxiety    Bipolar 2 disorder (HCC)    Chronic kidney disease    Chronic systolic (congestive) heart failure (HCC)    Depression    DKA (diabetic ketoacidoses)    ESRD on peritoneal dialysis (HCC)    HSV infection    on valtrex   Hypokalemia    Leukocytosis    Migraine    Noncompliance with medication regimen    Preeclampsia    Prolonged QT syndrome    Severe anemia    Type 1 diabetes mellitus (HCC)       Dispostion: I considered admission for this patient, and disposition pending completion of laboratory and imaging studies.  Please see provider signout for continuation of workup.     Final Clinical Impression(s) / ED Diagnoses Final diagnoses:  Hydronephrosis,  unspecified hydronephrosis type  Sinus tachycardia  Hyperkalemia     @PCDICTATION @    Glendora Score, MD 01/11/23 [phone]

## 2023-01-22 NOTE — Progress Notes (Incomplete)
ADVANCED HF CLINIC NOTE  PCP: Loura Pardon, PA Primary Cardiologist: DB  HPI:  Kathy Lewis is a 27 y/o woman with DM1, ESRD on PD and presumed peripartum CM dating back to 2021 with severe LV dysfunction. Has been followed at [facility] AHF team but work-up complicated by lack of f/o and compliance issues.    Recently admitted to [facility] and underwent RHC with markedly elevated filling pressures and low output. Started on inotropes. Also underwent CVVHD for several days but refused conversion to PD. Left AMA.   Echo 3/23 EF 20% RV moderately reduced   Admitted 1/24 for HF decompensation. PD not working. Switched to HD.  I saw her in clinic 1/24 and referred to [facility] for heart/kidney/pancreas transplant eval. Cancelled appointment due to COVID  Admitted 10/04/22 with intractable n/v and HTN urgency. Head CT with small chronic appearing right superior cerebellar infarct. No acute changes. Developed hypoglycemia and persistent uncontrolled hypertension. Admitted to the ICU for clevidipine infusion. Oral meds adjusted  Echo 10/05/22 EF < 20% RV moderately reduced. Mild MR  Seen in ED 10/22/22 with COVID. Left AMA  Returned to ED 11/14/22 with abdominal pain. Given IV fluids, reglan, and dilaudid. She left ED.   (Had normal gastric emptying study in 6/23)  Was seen at [facility] for heart-kidney transplant evaluation. CPX 4/24. pVO2 10.7 (34%) VE/VCO2 36 RER 0.92. Has been deferred for now. Until she can prove better compliance..  Today she returns for HF follow up. Feeling Ok. Doing HD 3x/week. BP ok at HD. Taking care of her 27 y/o and doing ADLs without too much difficulty. No edema, orthopnea or PND. Last HgB A1c 7.5 (3/24)   ROS: All systems negative except as listed in HPI, PMH and Problem List.  SH:  Social History   Socioeconomic History   Marital status: Single    Spouse name: Not on file   Number of children: 1   Years of education: Not on file   Highest education level:  Not on file  Occupational History   Occupation: disabled  Tobacco Use   Smoking status: Former    Packs/day: 1.00    Years: 2.00    Additional pack years: 0.00    Total pack years: 2.00    Types: Cigars, Cigarettes   Smokeless tobacco: Never  Vaping Use   Vaping Use: Never used  Substance and Sexual Activity   Alcohol use: No   Drug use: No   Sexual activity: Yes    Birth control/protection: None  Other Topics Concern   Not on file  Social History Narrative   Not on file   Social Determinants of Health   Financial Resource Strain: Not on file  Food Insecurity: No Food Insecurity (10/04/2022)   Hunger Vital Sign    Worried About Running Out of Food in the Last Year: Never true    Ran Out of Food in the Last Year: Never true  Transportation Needs: No Transportation Needs (10/04/2022)   PRAPARE - Administrator, Civil Service (Medical): No    Lack of Transportation (Non-Medical): No  Physical Activity: Not on file  Stress: Not on file  Social Connections: Not on file  Intimate Partner Violence: Not At Risk (10/04/2022)   Humiliation, Afraid, Rape, and Kick questionnaire    Fear of Current or Ex-Partner: No    Emotionally Abused: No    Physically Abused: No    Sexually Abused: No    FH:  Family History  Adopted: Yes  Problem Relation Age of Onset   Heart disease Neg Hx     Past Medical History:  Diagnosis Date   Anemia    Anxiety    Bipolar 2 disorder (HCC)    Chronic kidney disease    Chronic systolic (congestive) heart failure (HCC)    Depression    DKA (diabetic ketoacidoses)    ESRD on peritoneal dialysis (HCC)    HSV infection    on valtrex   Hypokalemia    Leukocytosis    Migraine    Noncompliance with medication regimen    Preeclampsia    Prolonged QT syndrome    Severe anemia    Type 1 diabetes mellitus (HCC)     Current Outpatient Medications  Medication Sig Dispense Refill   acetaminophen (TYLENOL) 500 MG tablet Take 1,000 mg by  mouth as needed for headache (pain).     amitriptyline (ELAVIL) 50 MG tablet Take 1 tablet (50 mg total) by mouth at bedtime. 30 tablet 3   atorvastatin (LIPITOR) 40 MG tablet Take 40 mg by mouth every evening.     calcium acetate (PHOSLO) 667 MG capsule Take 1,334 mg by mouth 3 (three) times daily with meals.     carvedilol (COREG) 12.5 MG tablet Take 1 tablet (12.5 mg total) by mouth 2 (two) times daily with a meal. 60 tablet 0   clonazePAM (KLONOPIN) 0.5 MG tablet Take 1 tablet (0.5 mg total) by mouth 2 (two) times daily as needed for anxiety. 20 tablet 0   hydrALAZINE (APRESOLINE) 50 MG tablet Take 1 tablet (50 mg total) by mouth in the morning and at bedtime. 30 tablet 0   hydrOXYzine (ATARAX) 10 MG tablet Take 10 mg by mouth at bedtime.     insulin lispro (HUMALOG) 100 UNIT/ML injection Inject 0.5 Units into the skin See admin instructions. 0.5 units per hour via insulin pump.     isosorbide mononitrate (IMDUR) 30 MG 24 hr tablet Take 1 tablet (30 mg total) by mouth daily. 30 tablet 0   magnesium oxide (MAG-OX) 400 MG tablet Take 800 mg by mouth daily.     metoCLOPramide (REGLAN) 5 MG tablet Take 1 tablet (5 mg total) by mouth as needed for nausea or vomiting. 30 tablet 0   pantoprazole (PROTONIX) 40 MG tablet Take 1 tablet (40 mg total) by mouth daily. 30 tablet 0   Vitamin D, Ergocalciferol, (DRISDOL) 1.25 MG (50000 UNIT) CAPS capsule Take 50,000 Units by mouth every Sunday.     No current facility-administered medications for this encounter.    Vitals:   01/24/23 1242  BP: 110/70  Pulse: 83  SpO2: 99%  Weight: 58.5 kg (129 lb)    Wt Readings from Last 3 Encounters:  01/24/23 58.5 kg (129 lb)  01/11/23 56.7 kg (125 lb)  12/21/22 56.7 kg (125 lb)      PHYSICAL EXAM: General:  Sitting in chair. No resp difficulty HEENT: normal Neck: supple. no JVD. Carotids 2+ bilat; no bruits. No lymphadenopathy or thryomegaly appreciated. Cor: PMI nondisplaced. + HD cath  Regular rate &  rhythm. No rubs, gallops or murmurs. Lungs: clear Abdomen: soft, nontender, nondistended. No hepatosplenomegaly. No bruits or masses. Good bowel sounds. Extremities: no cyanosis, clubbing, rash, edema Neuro: alert & orientedx3, cranial nerves grossly intact. moves all 4 extremities w/o difficulty. Affect pleasant   ASSESSMENT & PLAN:  1. 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 RHC 07/29/20:  CO low-normal by TD (CO/CI)  Thermodilution: 4.07/2.59) and normal cardiac output by Fick (CO/CI Fick: 4.7/3.0) ECHOs 21' suggested evidence of possible non-compaction, but MRI 49' showed no evidence of infarction, myocarditis, or infiltrative patterns. TTE on 07/30/20 with LVEF 35-40% but declined to 25-30% around 04/2021 likely in the setting of noncompliance with medical therapies.  - Echo 10/2021: EF of 20%. Small pericardial effusion. Indeterminate diastolic function   - Echo 08/14/22: EF 13%, mod MR, severe TR, PASP ~50, small effusion - RHC at [facility] 12/23: RA mean, PA mean 56, PCWP 39, PA sat 56%, Fick CI 1.7, Thermo CI 1.9 - Echo 10/05/22 EF < 20% RV moderately reduced. Mild MR - CPX 4/24. pVO2 10.7 (34%) VE/VCO2 36 RER 0.92. - Previously followed by AHF team at [facility] but has switched care here - Admitted with volume overload 1/24. PD wasn't working. Transitioned to HD  - Improved NYHA II-III Volume status improved  - BP improved. Now tolerating carvedilol and hydral/.imdur - Has been seen at [facility] for heart-kidney-pancreas transplant in 4/24. Apparently she has been turned down for compliance issues. We discussed this in detail and discussed need to show complete compliance with meds, HD and office visits as well as tight control of DM. If she can maintain this we will refer back for re-evaluation. I will d/w [facility] team.   2. End stage renal disease on PD - Volume status stable on HD  - Continue HD. Followed in [place]   3. DMI - per primary - Hgb A1c 8.6-> 7.5  12/23 ->  3/24  4. HTN - BP stable  5. N/V - suspect DM gastroparesis but emyptying study ok in 6/23 - RHC did not support low output state - now resolved  6. RIJ DVT - on Eliquis 2.5 bid - no bleeding   Arvilla Meres, MD  1:08 PM

## 2023-01-24 ENCOUNTER — Ambulatory Visit (HOSPITAL_COMMUNITY)
Admission: RE | Admit: 2023-01-24 | Discharge: 2023-01-24 | Disposition: A | Discharge status: Home / Self Care | Payer: Medicaid Other | Source: Ambulatory Visit | Attending: Internal Medicine | Admitting: Internal Medicine

## 2023-01-24 ENCOUNTER — Encounter (HOSPITAL_COMMUNITY): Payer: Self-pay | Admitting: Internal Medicine

## 2023-01-24 VITALS — BP 110/70 | HR 83 | Wt 129.0 lb

## 2023-01-24 DIAGNOSIS — Z87891 Personal history of nicotine dependence: Secondary | ICD-10-CM | POA: Insufficient documentation

## 2023-01-24 DIAGNOSIS — Z91199 Patient's noncompliance with other medical treatment and regimen due to unspecified reason: Secondary | ICD-10-CM | POA: Insufficient documentation

## 2023-01-24 DIAGNOSIS — I5023 Acute on chronic systolic (congestive) heart failure: Secondary | ICD-10-CM | POA: Insufficient documentation

## 2023-01-24 DIAGNOSIS — I5022 Chronic systolic (congestive) heart failure: Secondary | ICD-10-CM

## 2023-01-24 DIAGNOSIS — Z992 Dependence on renal dialysis: Secondary | ICD-10-CM | POA: Insufficient documentation

## 2023-01-24 DIAGNOSIS — R112 Nausea with vomiting, unspecified: Secondary | ICD-10-CM | POA: Diagnosis not present

## 2023-01-24 DIAGNOSIS — Z79899 Other long term (current) drug therapy: Secondary | ICD-10-CM | POA: Diagnosis not present

## 2023-01-24 DIAGNOSIS — Z794 Long term (current) use of insulin: Secondary | ICD-10-CM | POA: Diagnosis not present

## 2023-01-24 DIAGNOSIS — I3139 Other pericardial effusion (noninflammatory): Secondary | ICD-10-CM | POA: Diagnosis not present

## 2023-01-24 DIAGNOSIS — I82C11 Acute embolism and thrombosis of right internal jugular vein: Secondary | ICD-10-CM | POA: Insufficient documentation

## 2023-01-24 DIAGNOSIS — Z7901 Long term (current) use of anticoagulants: Secondary | ICD-10-CM | POA: Insufficient documentation

## 2023-01-24 DIAGNOSIS — N186 End stage renal disease: Secondary | ICD-10-CM | POA: Insufficient documentation

## 2023-01-24 DIAGNOSIS — Z8759 Personal history of other complications of pregnancy, childbirth and the puerperium: Secondary | ICD-10-CM | POA: Insufficient documentation

## 2023-01-24 DIAGNOSIS — E1022 Type 1 diabetes mellitus with diabetic chronic kidney disease: Secondary | ICD-10-CM | POA: Insufficient documentation

## 2023-01-24 DIAGNOSIS — I132 Hypertensive heart and chronic kidney disease with heart failure and with stage 5 chronic kidney disease, or end stage renal disease: Secondary | ICD-10-CM | POA: Diagnosis present

## 2023-01-24 NOTE — Patient Instructions (Signed)
Your physician recommends that you schedule a follow-up appointment in: 3 months with an echocardiogram  

## 2023-02-19 ENCOUNTER — Encounter (HOSPITAL_COMMUNITY): Payer: Self-pay

## 2023-02-19 ENCOUNTER — Observation Stay (HOSPITAL_COMMUNITY)
Admission: EM | Admit: 2023-02-19 | Discharge: 2023-02-21 | Disposition: A | Discharge status: Home / Self Care | Payer: Medicaid Other | Attending: Internal Medicine | Admitting: Internal Medicine

## 2023-02-19 ENCOUNTER — Emergency Department (HOSPITAL_COMMUNITY): Payer: Medicaid Other

## 2023-02-19 DIAGNOSIS — N186 End stage renal disease: Secondary | ICD-10-CM | POA: Diagnosis not present

## 2023-02-19 DIAGNOSIS — D631 Anemia in chronic kidney disease: Secondary | ICD-10-CM | POA: Diagnosis not present

## 2023-02-19 DIAGNOSIS — E1065 Type 1 diabetes mellitus with hyperglycemia: Secondary | ICD-10-CM

## 2023-02-19 DIAGNOSIS — Z992 Dependence on renal dialysis: Secondary | ICD-10-CM | POA: Diagnosis not present

## 2023-02-19 DIAGNOSIS — I82C11 Acute embolism and thrombosis of right internal jugular vein: Secondary | ICD-10-CM | POA: Diagnosis not present

## 2023-02-19 DIAGNOSIS — E1022 Type 1 diabetes mellitus with diabetic chronic kidney disease: Secondary | ICD-10-CM | POA: Diagnosis not present

## 2023-02-19 DIAGNOSIS — Z87891 Personal history of nicotine dependence: Secondary | ICD-10-CM | POA: Insufficient documentation

## 2023-02-19 DIAGNOSIS — Z79899 Other long term (current) drug therapy: Secondary | ICD-10-CM | POA: Diagnosis not present

## 2023-02-19 DIAGNOSIS — R112 Nausea with vomiting, unspecified: Secondary | ICD-10-CM | POA: Diagnosis present

## 2023-02-19 DIAGNOSIS — Z794 Long term (current) use of insulin: Secondary | ICD-10-CM | POA: Diagnosis not present

## 2023-02-19 DIAGNOSIS — I5022 Chronic systolic (congestive) heart failure: Secondary | ICD-10-CM | POA: Diagnosis not present

## 2023-02-19 DIAGNOSIS — I132 Hypertensive heart and chronic kidney disease with heart failure and with stage 5 chronic kidney disease, or end stage renal disease: Secondary | ICD-10-CM | POA: Insufficient documentation

## 2023-02-19 LAB — COMPREHENSIVE METABOLIC PANEL
ALT: 16 U/L (ref 0–44)
AST: 20 U/L (ref 15–41)
Albumin: 3 g/dL — ABNORMAL LOW (ref 3.5–5.0)
Alkaline Phosphatase: 97 U/L (ref 38–126)
Anion gap: 13 (ref 5–15)
BUN: 44 mg/dL — ABNORMAL HIGH (ref 6–20)
CO2: 20 mmol/L — ABNORMAL LOW (ref 22–32)
Calcium: 8.7 mg/dL — ABNORMAL LOW (ref 8.9–10.3)
Chloride: 102 mmol/L (ref 98–111)
Creatinine, Ser: 6.97 mg/dL — ABNORMAL HIGH (ref 0.44–1.00)
GFR, Estimated: 8 mL/min — ABNORMAL LOW (ref >60)
Glucose, Bld: 143 mg/dL — ABNORMAL HIGH (ref 70–99)
Potassium: 5 mmol/L (ref 3.5–5.1)
Sodium: 135 mmol/L (ref 135–145)
Total Bilirubin: 1 mg/dL (ref 0.3–1.2)
Total Protein: 6.6 g/dL (ref 6.5–8.1)

## 2023-02-19 LAB — GLUCOSE, CAPILLARY
Glucose-Capillary: 259 mg/dL — ABNORMAL HIGH (ref 70–99)
Glucose-Capillary: 289 mg/dL — ABNORMAL HIGH (ref 70–99)

## 2023-02-19 LAB — CBC
HCT: 41.7 % (ref 36.0–46.0)
Hemoglobin: 13.3 g/dL (ref 12.0–15.0)
MCH: 29.7 pg (ref 26.0–34.0)
MCHC: 31.9 g/dL (ref 30.0–36.0)
MCV: 93.1 fL (ref 80.0–100.0)
Platelets: 254 10*3/uL (ref 150–400)
RBC: 4.48 MIL/uL (ref 3.87–5.11)
RDW: 17.9 % — ABNORMAL HIGH (ref 11.5–15.5)
WBC: 6.1 10*3/uL (ref 4.0–10.5)
nRBC: 0 % (ref 0.0–0.2)

## 2023-02-19 LAB — LIPASE, BLOOD: Lipase: 20 U/L (ref 11–51)

## 2023-02-19 LAB — I-STAT BETA HCG BLOOD, ED (MC, WL, AP ONLY): I-stat hCG, quantitative: <5 m[IU]/mL (ref <5)

## 2023-02-19 LAB — CBG MONITORING, ED: Glucose-Capillary: 124 mg/dL — ABNORMAL HIGH (ref 70–99)

## 2023-02-19 MED ORDER — HYDROMORPHONE HCL 1 MG/ML IJ SOLN: 0.5000 mg | INTRAMUSCULAR | Status: DC | PRN

## 2023-02-19 MED ORDER — CLONAZEPAM 0.5 MG PO TABS: 0.5000 mg | ORAL_TABLET | Freq: Two times a day (BID) | ORAL | Status: DC | PRN

## 2023-02-19 MED ORDER — AMITRIPTYLINE HCL 50 MG PO TABS
50.0000 mg | ORAL_TABLET | Freq: Every day | ORAL | Status: DC
  Administered 2023-02-19 – 2023-02-20 (×2): 50 mg via ORAL
  Filled 2023-02-19 (×2): qty 1

## 2023-02-19 MED ORDER — INSULIN ASPART 100 UNIT/ML IJ SOLN
0.0000 [IU] | INTRAMUSCULAR | Status: DC
  Administered 2023-02-20: 4 [IU] via SUBCUTANEOUS
  Administered 2023-02-20: 1 [IU] via SUBCUTANEOUS

## 2023-02-19 MED ORDER — ISOSORBIDE MONONITRATE ER 30 MG PO TB24
30.0000 mg | ORAL_TABLET | Freq: Every day | ORAL | Status: DC
  Administered 2023-02-19 – 2023-02-21 (×3): 30 mg via ORAL
  Filled 2023-02-19 (×3): qty 1

## 2023-02-19 MED ORDER — ATORVASTATIN CALCIUM 40 MG PO TABS
40.0000 mg | ORAL_TABLET | Freq: Every evening | ORAL | Status: DC
  Administered 2023-02-19 – 2023-02-20 (×2): 40 mg via ORAL
  Filled 2023-02-19 (×2): qty 1

## 2023-02-19 MED ORDER — HYDRALAZINE HCL 20 MG/ML IJ SOLN: 10.0000 mg | Freq: Four times a day (QID) | INTRAMUSCULAR | Status: DC | PRN

## 2023-02-19 MED ORDER — HEPARIN SODIUM (PORCINE) 5000 UNIT/ML IJ SOLN
5000.0000 [IU] | Freq: Three times a day (TID) | INTRAMUSCULAR | Status: DC
  Administered 2023-02-19 – 2023-02-20 (×3): 5000 [IU] via SUBCUTANEOUS
  Filled 2023-02-19 (×3): qty 1

## 2023-02-19 MED ORDER — FENTANYL CITRATE PF 50 MCG/ML IJ SOSY
50.0000 ug | PREFILLED_SYRINGE | Freq: Once | INTRAMUSCULAR | Status: AC
  Administered 2023-02-19: 50 ug via INTRAVENOUS
  Filled 2023-02-19: qty 1

## 2023-02-19 MED ORDER — PANTOPRAZOLE SODIUM 40 MG PO TBEC
40.0000 mg | DELAYED_RELEASE_TABLET | Freq: Every day | ORAL | Status: DC
  Administered 2023-02-20 – 2023-02-21 (×2): 40 mg via ORAL
  Filled 2023-02-19 (×2): qty 1

## 2023-02-19 MED ORDER — OXYCODONE HCL 5 MG PO TABS
5.0000 mg | ORAL_TABLET | ORAL | Status: DC | PRN
  Administered 2023-02-21: 5 mg via ORAL
  Filled 2023-02-19: qty 1

## 2023-02-19 MED ORDER — HYDRALAZINE HCL 50 MG PO TABS
50.0000 mg | ORAL_TABLET | Freq: Two times a day (BID) | ORAL | Status: DC
  Administered 2023-02-19 – 2023-02-21 (×4): 50 mg via ORAL
  Filled 2023-02-19 (×4): qty 1

## 2023-02-19 MED ORDER — INSULIN GLARGINE-YFGN 100 UNIT/ML ~~LOC~~ SOLN
12.0000 [IU] | Freq: Every day | SUBCUTANEOUS | Status: DC
  Administered 2023-02-19: 12 [IU] via SUBCUTANEOUS
  Filled 2023-02-19 (×2): qty 0.12

## 2023-02-19 MED ORDER — CARVEDILOL 12.5 MG PO TABS
12.5000 mg | ORAL_TABLET | Freq: Two times a day (BID) | ORAL | Status: DC
  Administered 2023-02-20 – 2023-02-21 (×3): 12.5 mg via ORAL
  Filled 2023-02-19 (×3): qty 1

## 2023-02-19 MED ORDER — FENTANYL CITRATE PF 50 MCG/ML IJ SOSY
25.0000 ug | PREFILLED_SYRINGE | Freq: Once | INTRAMUSCULAR | Status: AC
  Administered 2023-02-19: 25 ug via INTRAVENOUS
  Filled 2023-02-19: qty 1

## 2023-02-19 MED ORDER — ONDANSETRON HCL 4 MG PO TABS
4.0000 mg | ORAL_TABLET | Freq: Four times a day (QID) | ORAL | Status: DC | PRN
  Administered 2023-02-19 – 2023-02-21 (×3): 4 mg via ORAL
  Filled 2023-02-19 (×4): qty 1

## 2023-02-19 MED ORDER — ACETAMINOPHEN 650 MG RE SUPP: 650.0000 mg | Freq: Four times a day (QID) | RECTAL | Status: DC | PRN

## 2023-02-19 MED ORDER — ACETAMINOPHEN 325 MG PO TABS
650.0000 mg | ORAL_TABLET | Freq: Four times a day (QID) | ORAL | Status: DC | PRN
  Administered 2023-02-20: 650 mg via ORAL
  Filled 2023-02-19: qty 2

## 2023-02-19 MED ORDER — LORAZEPAM 2 MG/ML IJ SOLN
0.5000 mg | Freq: Once | INTRAMUSCULAR | Status: AC
  Administered 2023-02-19: 0.5 mg via INTRAVENOUS
  Filled 2023-02-19: qty 1

## 2023-02-19 MED ORDER — ONDANSETRON HCL 4 MG/2ML IJ SOLN
4.0000 mg | Freq: Four times a day (QID) | INTRAMUSCULAR | Status: DC | PRN
  Administered 2023-02-20: 4 mg via INTRAVENOUS
  Filled 2023-02-19: qty 2

## 2023-02-19 NOTE — ED Notes (Signed)
Pt to CT

## 2023-02-19 NOTE — ED Triage Notes (Addendum)
Pt to ED BIB EMS from home with complaint of abd pain, N/V x3 days. Last dialysis on Saturday without complications. (T,TH,S) No fevers. Patient lethargic. Intermittent tremors.

## 2023-02-19 NOTE — ED Notes (Signed)
ED TO INPATIENT HANDOFF REPORT  ED Nurse Name and Phone #: Dot Lanes, MEDIC [phone]  S Name/Age/Gender Kathy Lewis 27 y.o. female Room/Bed: 003C/003C  Code Status   Code Status: Full Code  Home/SNF/Other Home Patient oriented to: self, place, time, and situation Is this baseline? Yes   Triage Complete: Triage complete  Chief Complaint Intractable nausea and vomiting [R11.2]  Triage Note Pt to ED BIB EMS from home with complaint of abd pain, N/V x3 days. Last dialysis on Saturday without complications. (T,TH,S) No fevers. Patient lethargic. Intermittent tremors.   Allergies Allergies  Allergen Reactions   Cantaloupe Extract Allergy Skin Test Itching    Mouth itching     Citrullus Vulgaris Itching and Other (See Comments)    Mouth itching  Makes mouth itch , ALL melons    Mouth itching   Food Itching    All melon - mouth itching   Nsaids Other (See Comments) and Itching    Avoid per nephrology  Avoid per nephrology    Avoid per nephrology Avoid per nephrology Avoid per nephrology Avoid per nephrology   Strawberry Extract Itching    Mouth itching    Level of Care/Admitting Diagnosis ED Disposition     ED Disposition  Admit   Condition  --   Comment  Hospital Area: [facility] [[id number]]  Level of Care: Med-Surg [16]  May place patient in observation at [facility] or [facility] if equivalent level of care is available:: No  Covid Evaluation: Asymptomatic - no recent exposure (last 10 days) testing not required  Diagnosis: Intractable nausea and vomiting [720114]  Admitting Physician: Narda Bonds [phone]  Attending Physician: Narda Bonds [phone]  Bed request comments: 82M if possible as patient is ESRD on HD          B Medical/Surgery History Past Medical History:  Diagnosis Date   Anemia    Anxiety    Bipolar 2 disorder (HCC)    Chronic kidney disease    Chronic systolic (congestive) heart failure (HCC)    Depression     DKA (diabetic ketoacidoses)    ESRD on peritoneal dialysis (HCC)    HSV infection    on valtrex   Hypokalemia    Leukocytosis    Migraine    Noncompliance with medication regimen    Preeclampsia    Prolonged QT syndrome    Severe anemia    Type 1 diabetes mellitus (HCC)    Past Surgical History:  Procedure Laterality Date   BIOPSY  04/24/2022   Procedure: BIOPSY;  Surgeon: Jenel Lucks, MD;  Location: [facility] ENDOSCOPY;  Service: Gastroenterology;;   CARDIAC CATHETERIZATION     COLONOSCOPY WITH PROPOFOL N/A 04/24/2022   Procedure: COLONOSCOPY WITH PROPOFOL;  Surgeon: Jenel Lucks, MD;  Location: [facility] ENDOSCOPY;  Service: Gastroenterology;  Laterality: N/A;   DILATION AND EVACUATION N/A 10/22/2019   Procedure: ULTRASOUND GUIDED DILATATION AND EVACUATION;  Surgeon: Geryl Rankins, MD;  Location: [facility] LD ORS;  Service: Gynecology;  Laterality: N/A;   ESOPHAGOGASTRODUODENOSCOPY (EGD) WITH PROPOFOL N/A 04/24/2022   Procedure: ESOPHAGOGASTRODUODENOSCOPY (EGD) WITH PROPOFOL;  Surgeon: Jenel Lucks, MD;  Location: [facility] ENDOSCOPY;  Service: Gastroenterology;  Laterality: N/A;   IR FLUORO GUIDE CV LINE RIGHT  09/14/2022   IR US GUIDE VASC ACCESS RIGHT  09/14/2022   peritoneal dialysis catheter insertion     RENAL BIOPSY       A IV Location/Drains/Wounds Patient Lines/Drains/Airways Status     Active Line/Drains/Airways  Name Placement date Placement time Site Days   Peripheral IV 02/19/23 20 G Right Antecubital 02/19/23  1423  Antecubital  less than 1   Hemodialysis Catheter Right Internal jugular Double lumen Permanent (Tunneled) 09/14/22  1520  Internal jugular  158            Intake/Output Last 24 hours No intake or output data in the 24 hours ending 02/19/23 1835  Labs/Imaging Results for orders placed or performed during the hospital encounter of 02/19/23 (from the past 48 hour(s))  Lipase, blood     Status: None   Collection Time: 02/19/23  2:22 PM   Result Value Ref Range   Lipase 20 11 - 51 U/L    Comment: Performed at [facility], [address] [place], [place] [address]  Comprehensive metabolic panel     Status: Abnormal   Collection Time: 02/19/23  2:22 PM  Result Value Ref Range   Sodium 135 135 - 145 mmol/L   Potassium 5.0 3.5 - 5.1 mmol/L   Chloride 102 98 - 111 mmol/L   CO2 20 (L) 22 - 32 mmol/L   Glucose, Bld 143 (H) 70 - 99 mg/dL    Comment: Glucose reference range applies only to samples taken after fasting for at least 8 hours.   BUN 44 (H) 6 - 20 mg/dL   Creatinine, Ser 1.60 (H) 0.44 - 1.00 mg/dL   Calcium 8.7 (L) 8.9 - 10.3 mg/dL   Total Protein 6.6 6.5 - 8.1 g/dL   Albumin 3.0 (L) 3.5 - 5.0 g/dL   AST 20 15 - 41 U/L   ALT 16 0 - 44 U/L   Alkaline Phosphatase 97 38 - 126 U/L   Total Bilirubin 1.0 0.3 - 1.2 mg/dL   GFR, Estimated 8 (L) >60 mL/min    Comment: (NOTE) Calculated using the CKD-EPI Creatinine Equation (2021)    Anion gap 13 5 - 15    Comment: Performed at [facility], [address] [place], [place] [address]  CBC     Status: Abnormal   Collection Time: 02/19/23  2:22 PM  Result Value Ref Range   WBC 6.1 4.0 - 10.5 K/uL   RBC 4.48 3.87 - 5.11 MIL/uL   Hemoglobin 13.3 12.0 - 15.0 g/dL   HCT 62.6 94.8 - 54.6 %   MCV 93.1 80.0 - 100.0 fL   MCH 29.7 26.0 - 34.0 pg   MCHC 31.9 30.0 - 36.0 g/dL   RDW 27.0 (H) 35.0 - 09.3 %   Platelets 254 150 - 400 K/uL   nRBC 0.0 0.0 - 0.2 %    Comment: Performed at [facility], [address] [place], [place] [address]  CBG monitoring, ED     Status: Abnormal   Collection Time: 02/19/23  2:47 PM  Result Value Ref Range   Glucose-Capillary 124 (H) 70 - 99 mg/dL    Comment: Glucose reference range applies only to samples taken after fasting for at least 8 hours.  I-Stat beta hCG blood, ED ([facility], [facility], [facility] only)     Status: None   Collection Time: 02/19/23  4:20 PM  Result Value Ref Range   I-stat hCG, quantitative <5.0 <5 mIU/mL   Comment  3            Comment:   GEST. AGE      CONC.  (mIU/mL)   <=1 WEEK        5 - 50  2 WEEKS       50 - 500     3 WEEKS       100 - 10,000     4 WEEKS     1,000 - 30,000        FEMALE AND NON-PREGNANT FEMALE:     LESS THAN 5 mIU/mL    CT ABDOMEN PELVIS WO CONTRAST  Result Date: 02/19/2023 CLINICAL DATA:  Nonlocalized acute abdominal pain. History of peritoneal dialysis. Congestive heart failure. EXAM: CT ABDOMEN AND PELVIS WITHOUT CONTRAST TECHNIQUE: Multidetector CT imaging of the abdomen and pelvis was performed following the standard protocol without IV contrast. RADIATION DOSE REDUCTION: This exam was performed according to the departmental dose-optimization program which includes automated exposure control, adjustment of the mA and/or kV according to patient size and/or use of iterative reconstruction technique. COMPARISON:  01/11/2023 FINDINGS: Lower chest: subsegmental atelectasis and volume loss at both lung bases. Moderate cardiomegaly with incompletely imaged central line. Trace pericardial fluid is similar. Hepatobiliary: Variant lateral segment left liver lobe extending in the left upper quadrant. Mild hepatomegaly at 18.2 cm. Normal gallbladder, without biliary ductal dilatation. Pancreas: Pancreatic atrophy involving the body and tail. No duct dilatation or acute inflammation. Spleen: Normal in size, without focal abnormality. Adrenals/Urinary Tract: Normal adrenal glands. No renal calculi or hydronephrosis. Resolution of left hydroureter. No bladder calculi. Stomach/Bowel: Normal stomach, without wall thickening. Scattered colonic diverticula. Normal terminal ileum. Appendix not visualized. Normal small bowel. Vascular/Lymphatic: Normal caliber of the aorta and branch vessels. Markedly age advanced atherosclerosis within pelvic vasculature. No abdominopelvic adenopathy. Reproductive: Normal uterus and adnexa. Other: No free intraperitoneal air. Small volume abdominopelvic fluid is similar.  Anasarca. Musculoskeletal: No acute osseous abnormality. Transitional S1 vertebral body. IMPRESSION: 1. No urinary tract calculi or hydronephrosis. Resolution of left hydroureter. 2. Similar small volume abdominopelvic fluid and anasarca, likely related to fluid peritoneal dialysis. 3. Cardiomegaly with trace pericardial fluid, similar. 4. Age advanced atherosclerosis. Electronically Signed   By: Jeronimo Greaves M.D.   On: 02/19/2023 17:26    Pending Labs Unresulted Labs (From admission, onward)    None       Vitals/Pain Today's Vitals   02/19/23 1613 02/19/23 1738 02/19/23 1740 02/19/23 1749  BP:   (!) 144/130   Pulse:   93   Resp:   18   Temp:   98.4 F (36.9 C)   TempSrc:   Oral   SpO2:   99% 100%  Weight:      Height:      PainSc: 4  6       Isolation Precautions No active isolations  Medications Medications  heparin injection 5,000 Units (has no administration in time range)  acetaminophen (TYLENOL) tablet 650 mg (has no administration in time range)    Or  acetaminophen (TYLENOL) suppository 650 mg (has no administration in time range)  ondansetron (ZOFRAN) tablet 4 mg (has no administration in time range)    Or  ondansetron (ZOFRAN) injection 4 mg (has no administration in time range)  hydrALAZINE (APRESOLINE) injection 10 mg (has no administration in time range)  HYDROmorphone (DILAUDID) injection 0.5 mg (has no administration in time range)  oxyCODONE (Oxy IR/ROXICODONE) immediate release tablet 5 mg (has no administration in time range)  LORazepam (ATIVAN) injection 0.5 mg (0.5 mg Intravenous Given 02/19/23 1440)  fentaNYL (SUBLIMAZE) injection 25 mcg (25 mcg Intravenous Given 02/19/23 1535)  fentaNYL (SUBLIMAZE) injection 50 mcg (50 mcg Intravenous Given 02/19/23 1737)    Mobility walks  Focused Assessments     R Recommendations: See Admitting Provider Note  Report given to:   Additional Notes:

## 2023-02-19 NOTE — ED Provider Notes (Signed)
  Physical Exam  BP (!) 153/121 (BP Location: Right Arm)   Pulse 94   Temp 98.5 F (36.9 C) (Oral)   Resp 16   Ht 5' (1.524 m)   Wt 58.5 kg   SpO2 99%   BMI 25.19 kg/m   Physical Exam  Procedures  Procedures  ED Course / MDM    Medical Decision Making Amount and/or Complexity of Data Reviewed Labs: ordered. Radiology: ordered.  Risk Prescription drug management.   71F, ESRD on HD, presenting with abd pain.  Plan to admit for intractable pain with associated nausea and vomiting.  Message hosp once CT Abd/Pel results.  CT: IMPRESSION:  1. No urinary tract calculi or hydronephrosis. Resolution of left  hydroureter.  2. Similar small volume abdominopelvic fluid and anasarca, likely  related to fluid peritoneal dialysis.  3. Cardiomegaly with trace pericardial fluid, similar.  4. Age advanced atherosclerosis.    Fentanyl administered for pain control.  Hospitalist medicine was repaged and subsequently admitted the patient.         Ernie Avena, MD 02/19/23 [phone]

## 2023-02-19 NOTE — Progress Notes (Addendum)
No report received on patient. See vital signs. Patient given 1845 medication. Patient admitted to unit. Patient oriented to room. Patient is A/OX4, 2L [place] upon primary RN entering room. Patient refused bed alarm. Call bell at bedside and bed in lowest position. All safety measures in place. Will continue to monitor.

## 2023-02-19 NOTE — Plan of Care (Signed)
  Problem: Activity: Goal: Risk for activity intolerance will decrease Outcome: Progressing   Problem: Nutrition: Goal: Adequate nutrition will be maintained Outcome: Progressing   Problem: Pain Managment: Goal: General experience of comfort will improve Outcome: Progressing   

## 2023-02-19 NOTE — Progress Notes (Signed)
Patient deferred admission questions r/t fatigue at this time.

## 2023-02-19 NOTE — H&P (Signed)
History and Physical    Patient: Kathy Lewis ZOX:[id number] DOB: 1996/04/22 DOA: 02/19/2023 DOS: the patient was seen and examined on 02/19/2023 PCP: Loura Pardon, PA  Patient coming from: Home  Chief Complaint:  Chief Complaint  Patient presents with   Abdominal Pain   Nausea   HPI: Kathy Lewis is a 27 y.o. female with medical history significant of ESRD on hemodialysis, bipolar disorder, diabetes mellitus type 1, anxiety, depression.  Patient presented secondary to 1 week of nonradiating and stable abdominal pain that is located mostly in epigastric area.  Patient reports associated nausea and vomiting.  She has been having bowel movements with last bowel movement yesterday described as soft.  No sick contacts.  She has had this pain intermittently for the last few months.  She reports having a workup done which included an upper endoscopy evaluation which did not reveal etiology for her pain.  She was seen in the ED 1 month prior with evidence of hydronephrosis which is now resolved on current CT imaging.  No fevers, chest pain, dyspnea, diarrhea.  Review of Systems: As mentioned in the history of present illness. All other systems reviewed and are negative. Past Medical History:  Diagnosis Date   Anemia    Anxiety    Bipolar 2 disorder (HCC)    Chronic kidney disease    Chronic systolic (congestive) heart failure (HCC)    Depression    DKA (diabetic ketoacidoses)    ESRD on peritoneal dialysis (HCC)    HSV infection    on valtrex   Hypokalemia    Leukocytosis    Migraine    Noncompliance with medication regimen    Preeclampsia    Prolonged QT syndrome    Severe anemia    Type 1 diabetes mellitus (HCC)    Past Surgical History:  Procedure Laterality Date   BIOPSY  04/24/2022   Procedure: BIOPSY;  Surgeon: Jenel Lucks, MD;  Location: [facility] ENDOSCOPY;  Service: Gastroenterology;;   CARDIAC CATHETERIZATION     COLONOSCOPY WITH PROPOFOL N/A 04/24/2022    Procedure: COLONOSCOPY WITH PROPOFOL;  Surgeon: Jenel Lucks, MD;  Location: [facility] ENDOSCOPY;  Service: Gastroenterology;  Laterality: N/A;   DILATION AND EVACUATION N/A 10/22/2019   Procedure: ULTRASOUND GUIDED DILATATION AND EVACUATION;  Surgeon: Geryl Rankins, MD;  Location: [facility] LD ORS;  Service: Gynecology;  Laterality: N/A;   ESOPHAGOGASTRODUODENOSCOPY (EGD) WITH PROPOFOL N/A 04/24/2022   Procedure: ESOPHAGOGASTRODUODENOSCOPY (EGD) WITH PROPOFOL;  Surgeon: Jenel Lucks, MD;  Location: [facility] ENDOSCOPY;  Service: Gastroenterology;  Laterality: N/A;   IR FLUORO GUIDE CV LINE RIGHT  09/14/2022   IR US GUIDE VASC ACCESS RIGHT  09/14/2022   peritoneal dialysis catheter insertion     RENAL BIOPSY     Social History:  reports that she has quit smoking. Her smoking use included cigars and cigarettes. She has a 2.00 pack-year smoking history. She has never used smokeless tobacco. She reports that she does not drink alcohol and does not use drugs.  Allergies  Allergen Reactions   Cantaloupe Extract Allergy Skin Test Itching    Mouth itching     Citrullus Vulgaris Itching and Other (See Comments)    Mouth itching  Makes mouth itch , ALL melons    Mouth itching   Food Itching    All melon - mouth itching   Nsaids Other (See Comments) and Itching    Avoid per nephrology  Avoid per nephrology    Avoid per nephrology Avoid per nephrology  Avoid per nephrology Avoid per nephrology   Strawberry Extract Itching    Mouth itching    Family History  Adopted: Yes  Problem Relation Age of Onset   Heart disease Neg Hx     Prior to Admission medications   Medication Sig Start Date End Date Taking? Authorizing Provider  acetaminophen (TYLENOL) 500 MG tablet Take 1,000 mg by mouth as needed for headache (pain).    [provider]  amitriptyline (ELAVIL) 50 MG tablet Take 1 tablet (50 mg total) by mouth at bedtime. 08/30/22   Shanna Cisco, NP  atorvastatin (LIPITOR) 40 MG  tablet Take 40 mg by mouth every evening.    [provider]  calcium acetate (PHOSLO) 667 MG capsule Take 1,334 mg by mouth 3 (three) times daily with meals. 09/28/22   [provider]  carvedilol (COREG) 12.5 MG tablet Take 1 tablet (12.5 mg total) by mouth 2 (two) times daily with a meal. 10/08/22 11/20/23  Arrien, York Ram, MD  clonazePAM (KLONOPIN) 0.5 MG tablet Take 1 tablet (0.5 mg total) by mouth 2 (two) times daily as needed for anxiety. 03/16/22   Leroy Sea, MD  hydrALAZINE (APRESOLINE) 50 MG tablet Take 1 tablet (50 mg total) by mouth in the morning and at bedtime. 10/08/22 10/08/23  Arrien, York Ram, MD  hydrOXYzine (ATARAX) 10 MG tablet Take 10 mg by mouth at bedtime.    [provider]  insulin lispro (HUMALOG) 100 UNIT/ML injection Inject 0.5 Units into the skin See admin instructions. 0.5 units per hour via insulin pump. 01/08/22   [provider]  isosorbide mononitrate (IMDUR) 30 MG 24 hr tablet Take 1 tablet (30 mg total) by mouth daily. 10/09/22 11/20/23  Arrien, York Ram, MD  magnesium oxide (MAG-OX) 400 MG tablet Take 800 mg by mouth daily.    [provider]  metoCLOPramide (REGLAN) 5 MG tablet Take 1 tablet (5 mg total) by mouth as needed for nausea or vomiting. 11/25/22   Dione Booze, MD  pantoprazole (PROTONIX) 40 MG tablet Take 1 tablet (40 mg total) by mouth daily. 10/08/22 11/20/23  Arrien, York Ram, MD  Vitamin D, Ergocalciferol, (DRISDOL) 1.25 MG (50000 UNIT) CAPS capsule Take 50,000 Units by mouth every Sunday.    [provider]  escitalopram (LEXAPRO) 10 MG tablet Take 20 mg by mouth daily.  05/10/20 10/06/20  [provider]  furosemide (LASIX) 40 MG tablet Take 1 tablet (40 mg total) by mouth daily. Patient not taking: Reported on 02/16/2021 10/28/19 02/17/21  Geryl Rankins, MD  lisinopril (ZESTRIL) 10 MG tablet Take 10 mg by mouth daily.  02/23/20 10/06/20  [provider]   spironolactone (ALDACTONE) 25 MG tablet Take 25 mg by mouth daily.  04/20/20 10/06/20  [provider]    Physical Exam: Vitals:   02/19/23 1344 02/19/23 1346 02/19/23 1347 02/19/23 1348  BP:   (!) 153/121   Pulse: 94     Resp: 16     Temp: 98.5 F (36.9 C)     TempSrc: Oral     SpO2:    99%  Weight:  58.5 kg    Height:  5' (1.524 m)     General exam: Appears calm and comfortable and in no acute distress. Conversant Respiratory: Clear to auscultation. Respiratory effort normal with no intercostal retractions or use of accessory muscles Cardiovascular: S1 & S2 heard, RRR. No murmurs, rubs, gallops or clicks.  No lower extremity edema Gastrointestinal: Abdomen is slightly distended,  soft and tender in epigastric area. No masses felt.  Normal bowel sounds heard Neurologic: No focal neurological deficits Musculoskeletal: No calf tenderness Skin: No cyanosis. No new rashes Psychiatry: Alert and oriented x 4. Memory intact. Mood & affect appropriate  Data Reviewed: There are no new results to review at this time.  Assessment and Plan:  Abdominal pain Unclear etiology.  Patient has been workup in the past for this issue.  Upper endoscopy from August 2023 without etiology for symptoms with unremarkable biopsies.  Hypothesis of tics could be related to cyclic vomiting syndrome.  Patient states that she does not smoke marijuana but she does admit that she uses THC containing edible products. -Analgesics PRN -Clear liquid diet and advance as tolerated  Nausea/vomiting -Zofran PRN  ESRD on HD Patient is on a Tuesday, Thursday, Saturday schedule with last hemodialysis on Saturday, 6/22.  Primary hypertension Severe asymptomatic hypertension Severe hypertension secondary to patient not taking her blood pressure medications today.  Patient is on hydralazine 50 mg twice daily, Imdur 30 mg daily, Coreg 12.5 mg twice daily. -Resume home hydralazine 50 mg twice daily, Imdur 30 mg  daily, Coreg 12.5 mg twice daily -Hydralazine IV PRN if unable to take PO  Diabetes mellitus type 1 Uncontrolled from last hemoglobin A1C of 8.6%.  No evidence of DKA.  Last hemoglobin A1C of 8.6%.  Patient is on insulin pump as an outpatient.  Patient desires to not use insulin pump while inpatient. Her basal rate is 0.5 units/hr -Semglee 12 units daily -SSI sensitive scale  Depression -Continue Elavil nightly  GERD -Continue Protonix   Advance Care Planning:   Code Status: Full Code. Discussed with patient at bedside.  Consults: Nephrology  Family Communication: None at bedside\   Author: Jacquelin Hawking, MD 02/19/2023 4:23 PM  For on call review [email or web address].

## 2023-02-19 NOTE — ED Notes (Signed)
Pt O2 dropped to 90 % RA after fentanyl. Pt placed on 2L [place]

## 2023-02-19 NOTE — ED Provider Notes (Signed)
[facility]Y DEPARTMENT AT [facility] Provider Note   CSN: [id number] Arrival date & time: 02/19/23  1341     History {Add pertinent medical, surgical, social history, OB history to HPI:1} Chief Complaint  Patient presents with   Abdominal Pain   Nausea    Kathy Lewis is a 27 y.o. female.  HPI 27 year old female history of abdominal pain, on dialysis, last dialysis Saturday, presents today complaining of nausea vomiting and abdominal pain.  She states that she has had this abdominal pain in the past but nobody has been able to figure out what it is.  She has not been able to tolerate food or fluid.  She is having intermittent tremors.  She denies fever or chills.  She does not make urine.     Home Medications Prior to Admission medications   Medication Sig Start Date End Date Taking? Authorizing Provider  acetaminophen (TYLENOL) 500 MG tablet Take 1,000 mg by mouth as needed for headache (pain).    [provider]  amitriptyline (ELAVIL) 50 MG tablet Take 1 tablet (50 mg total) by mouth at bedtime. 08/30/22   Shanna Cisco, NP  atorvastatin (LIPITOR) 40 MG tablet Take 40 mg by mouth every evening.    [provider]  calcium acetate (PHOSLO) 667 MG capsule Take 1,334 mg by mouth 3 (three) times daily with meals. 09/28/22   [provider]  carvedilol (COREG) 12.5 MG tablet Take 1 tablet (12.5 mg total) by mouth 2 (two) times daily with a meal. 10/08/22 11/20/23  Arrien, York Ram, MD  clonazePAM (KLONOPIN) 0.5 MG tablet Take 1 tablet (0.5 mg total) by mouth 2 (two) times daily as needed for anxiety. 03/16/22   Leroy Sea, MD  hydrALAZINE (APRESOLINE) 50 MG tablet Take 1 tablet (50 mg total) by mouth in the morning and at bedtime. 10/08/22 10/08/23  Arrien, York Ram, MD  hydrOXYzine (ATARAX) 10 MG tablet Take 10 mg by mouth at bedtime.    [provider]  insulin lispro (HUMALOG) 100 UNIT/ML injection Inject  0.5 Units into the skin See admin instructions. 0.5 units per hour via insulin pump. 01/08/22   [provider]  isosorbide mononitrate (IMDUR) 30 MG 24 hr tablet Take 1 tablet (30 mg total) by mouth daily. 10/09/22 11/20/23  Arrien, York Ram, MD  magnesium oxide (MAG-OX) 400 MG tablet Take 800 mg by mouth daily.    [provider]  metoCLOPramide (REGLAN) 5 MG tablet Take 1 tablet (5 mg total) by mouth as needed for nausea or vomiting. 11/25/22   Dione Booze, MD  pantoprazole (PROTONIX) 40 MG tablet Take 1 tablet (40 mg total) by mouth daily. 10/08/22 11/20/23  Arrien, York Ram, MD  Vitamin D, Ergocalciferol, (DRISDOL) 1.25 MG (50000 UNIT) CAPS capsule Take 50,000 Units by mouth every Sunday.    [provider]  escitalopram (LEXAPRO) 10 MG tablet Take 20 mg by mouth daily.  05/10/20 10/06/20  [provider]  furosemide (LASIX) 40 MG tablet Take 1 tablet (40 mg total) by mouth daily. Patient not taking: Reported on 02/16/2021 10/28/19 02/17/21  Geryl Rankins, MD  lisinopril (ZESTRIL) 10 MG tablet Take 10 mg by mouth daily.  02/23/20 10/06/20  [provider]  spironolactone (ALDACTONE) 25 MG tablet Take 25 mg by mouth daily.  04/20/20 10/06/20  [provider]      Allergies    Cantaloupe extract allergy skin test, Citrullus vulgaris, Food, Nsaids, and Strawberry extract  Review of Systems   Review of Systems  Physical Exam Updated Vital Signs BP (!) 153/121 (BP Location: Right Arm)   Pulse 94   Temp 98.5 F (36.9 C) (Oral)   Resp 16   Ht 1.524 m (5')   Wt 58.5 kg   SpO2 99%   BMI 25.19 kg/m  Physical Exam Vitals and nursing note reviewed.  Constitutional:      General: She is not in acute distress.    Appearance: She is ill-appearing.     Comments: Chronically ill-appearing female who does not appear well on my exam  HENT:     Head: Normocephalic.     Mouth/Throat:     Pharynx: Oropharynx is clear.  Eyes:      Extraocular Movements: Extraocular movements intact.  Cardiovascular:     Rate and Rhythm: Normal rate and regular rhythm.  Pulmonary:     Effort: Pulmonary effort is normal.     Breath sounds: Normal breath sounds.  Abdominal:     General: Bowel sounds are normal. There is distension.     Palpations: Abdomen is soft.     Tenderness: There is generalized abdominal tenderness.  Skin:    General: Skin is warm and dry.     Capillary Refill: Capillary refill takes less than 2 seconds.  Neurological:     General: No focal deficit present.     Mental Status: She is alert.  Psychiatric:        Mood and Affect: Mood normal.        Behavior: Behavior normal.     ED Results / Procedures / Treatments   Labs (all labs ordered are listed, but only abnormal results are displayed) Labs Reviewed  COMPREHENSIVE METABOLIC PANEL - Abnormal; Notable for the following components:      Result Value   CO2 20 (*)    Glucose, Bld 143 (*)    BUN 44 (*)    Creatinine, Ser 6.97 (*)    Calcium 8.7 (*)    Albumin 3.0 (*)    GFR, Estimated 8 (*)    All other components within normal limits  CBC - Abnormal; Notable for the following components:   RDW 17.9 (*)    All other components within normal limits  CBG MONITORING, ED - Abnormal; Notable for the following components:   Glucose-Capillary 124 (*)    All other components within normal limits  LIPASE, BLOOD  I-STAT BETA HCG BLOOD, ED ([facility], [facility], [facility] ONLY)    EKG None  Radiology No results found.  Procedures Procedures  {Document cardiac monitor, telemetry assessment procedure when appropriate:1}  Medications Ordered in ED Medications  LORazepam (ATIVAN) injection 0.5 mg (0.5 mg Intravenous Given 02/19/23 1440)  fentaNYL (SUBLIMAZE) injection 25 mcg (25 mcg Intravenous Given 02/19/23 1535)    ED Course/ Medical Decision Making/ A&P   {   Click here for ABCD2, HEART and other calculatorsREFRESH Note before signing :1}                           Medical Decision Making Amount and/or Complexity of Data Reviewed Labs: ordered. Radiology: ordered.  Risk Prescription drug management.   27 year old female end-stage renal disease, on dialysis, presents today with abdominal pain nausea and vomiting.  Patient does appear somewhat ill.  She appears to be somewhat volume depleted but is hypertensive. Patient was evaluated here with labs that are consistent with her chronic renal failure.  She does  not appear to have any acute electrolyte abnormalities. Care discussed with Dr. Caleb Popp who will plan on admitting her CT of abdomen is pending.  {Document critical care time when appropriate:1} {Document review of labs and clinical decision tools ie heart score, Chads2Vasc2 etc:1}  {Document your independent review of radiology images, and any outside records:1} {Document your discussion with family members, caretakers, and with consultants:1} {Document social determinants of health affecting pt's care:1} {Document your decision making why or why not admission, treatments were needed:1} Final Clinical Impression(s) / ED Diagnoses Final diagnoses:  Nausea and vomiting, unspecified vomiting type    Rx / DC Orders ED Discharge Orders     None

## 2023-02-20 DIAGNOSIS — R112 Nausea with vomiting, unspecified: Secondary | ICD-10-CM | POA: Diagnosis not present

## 2023-02-20 DIAGNOSIS — I82C11 Acute embolism and thrombosis of right internal jugular vein: Secondary | ICD-10-CM | POA: Insufficient documentation

## 2023-02-20 LAB — GLUCOSE, CAPILLARY
Glucose-Capillary: 108 mg/dL — ABNORMAL HIGH (ref 70–99)
Glucose-Capillary: 158 mg/dL — ABNORMAL HIGH (ref 70–99)
Glucose-Capillary: 315 mg/dL — ABNORMAL HIGH (ref 70–99)
Glucose-Capillary: 41 mg/dL — CL (ref 70–99)
Glucose-Capillary: 54 mg/dL — ABNORMAL LOW (ref 70–99)
Glucose-Capillary: 66 mg/dL — ABNORMAL LOW (ref 70–99)
Glucose-Capillary: 86 mg/dL (ref 70–99)

## 2023-02-20 LAB — HEMOGLOBIN A1C
Hgb A1c MFr Bld: 9.3 % — ABNORMAL HIGH (ref 4.8–5.6)
Mean Plasma Glucose: 220.21 mg/dL

## 2023-02-20 LAB — PHOSPHORUS: Phosphorus: 6 mg/dL — ABNORMAL HIGH (ref 2.5–4.6)

## 2023-02-20 LAB — HEPATITIS B SURFACE ANTIGEN: Hepatitis B Surface Ag: NONREACTIVE

## 2023-02-20 MED ORDER — INSULIN GLARGINE-YFGN 100 UNIT/ML ~~LOC~~ SOLN
3.0000 [IU] | Freq: Once | SUBCUTANEOUS | Status: AC
  Administered 2023-02-20: 3 [IU] via SUBCUTANEOUS
  Filled 2023-02-20 (×2): qty 0.03

## 2023-02-20 MED ORDER — CHLORHEXIDINE GLUCONATE CLOTH 2 % EX PADS
6.0000 | MEDICATED_PAD | Freq: Every day | CUTANEOUS | Status: DC
  Administered 2023-02-21: 6 via TOPICAL

## 2023-02-20 MED ORDER — DEXTROSE 5 % IV SOLN: INTRAVENOUS | Status: DC

## 2023-02-20 MED ORDER — INSULIN GLARGINE-YFGN 100 UNIT/ML ~~LOC~~ SOLN
6.0000 [IU] | Freq: Every day | SUBCUTANEOUS | Status: DC
  Filled 2023-02-20 (×2): qty 0.06

## 2023-02-20 MED ORDER — HEPARIN SODIUM (PORCINE) 1000 UNIT/ML DIALYSIS: 2000.0000 [IU] | INTRAMUSCULAR | Status: DC | PRN

## 2023-02-20 MED ORDER — HEPARIN SODIUM (PORCINE) 1000 UNIT/ML DIALYSIS: 2500.0000 [IU] | Freq: Once | INTRAMUSCULAR | Status: DC

## 2023-02-20 MED ORDER — APIXABAN 2.5 MG PO TABS
2.5000 mg | ORAL_TABLET | Freq: Two times a day (BID) | ORAL | Status: DC
  Administered 2023-02-20 – 2023-02-21 (×2): 2.5 mg via ORAL
  Filled 2023-02-20 (×2): qty 1

## 2023-02-20 MED ORDER — HEPARIN SODIUM (PORCINE) 1000 UNIT/ML IJ SOLN
INTRAMUSCULAR | Status: AC
  Filled 2023-02-20: qty 3

## 2023-02-20 NOTE — Inpatient Diabetes Management (Signed)
Inpatient Diabetes Program Recommendations  AACE/ADA: New Consensus Statement on Inpatient Glycemic Control (2015)  Target Ranges:  Prepandial:   less than 140 mg/dL      Peak postprandial:   less than 180 mg/dL (1-2 hours)      Critically ill patients:  140 - 180 mg/dL   Lab Results  Component Value Date   GLUCAP 86 02/20/2023   HGBA1C 9.3 (H) 02/20/2023    Review of Glycemic Control  Latest Reference Range & Units 02/19/23 22:57 02/20/23 00:56 02/20/23 04:16 02/20/23 08:15 02/20/23 09:53  Glucose-Capillary 70 - 99 mg/dL 130 (H)  Levemir 12 units  315 (H)  Novolog 4 units 158 (H)  Novolog 1 unit 66 (L) 86  (H): Data is abnormally high (L): Data is abnormally low  Diabetes history: DM1(does not make insulin.  Needs correction, basal and meal coverage)  Outpatient Diabetes medications:  OmniPod insulin pump with Dexcom CGM; insulin pump settings (0.5 units/hour (total basal 12 units per day), 1 unit per 8 grams carbs, 1 unit drops glucose 50 mg/dl)   Current orders for Inpatient glycemic control: Semglee 12 units at bedtime Novolog 0-6 units Q4H  Inpatient Diabetes Program Recommendations:    Mild hypoglycemia this morning of 66 mg/dL.  Please consider:  Semglee 6 units at bedtime Novolog 0-6 units TID and 0-5 units at bedtime  Nvolog 2 units TID with meals if she consumes at least 50%  Will continue to follow while inpatient.  Thank you, Dulce Sellar, MSN, CDCES Diabetes Coordinator Inpatient Diabetes Program [phone] (team pager from 8a-5p)

## 2023-02-20 NOTE — Progress Notes (Signed)
RN contacted dialysis to confirm that the patient is having dialysis this AM. RN instructed to call back.

## 2023-02-20 NOTE — Progress Notes (Signed)
Rounds and Q2T completed. Patient tolerated well. 

## 2023-02-20 NOTE — Progress Notes (Signed)
   02/20/23 1755  Vitals  Temp 98.4 F (36.9 C)  Pulse Rate 75  Resp 13  BP (!) 124/95  SpO2 100 %  Post Treatment  Dialyzer Clearance Clear  Duration of HD Treatment -hour(s) 3.5 hour(s)  Hemodialysis Intake (mL) 0 mL  Liters Processed 73.5  Fluid Removed (mL) 3000 mL  Tolerated HD Treatment Yes   Received patient in bed to unit.  Alert and oriented.  Informed consent signed and in chart.   TX duration:3.5hrs  Patient tolerated well.  Transported back to the room  Alert, without acute distress.  Hand-off given to patient's nurse.   Access used: [facility] Access issues: none  Total UF removed: 3L Medication(s) given: Tylenol    [facility]

## 2023-02-20 NOTE — Progress Notes (Signed)
Pt receives out-pt HD at [facility] on [facility]. Will assist as needed.   Micahel Omlor Renal Navigator [phone] 

## 2023-02-20 NOTE — Progress Notes (Addendum)
PROGRESS NOTE    Kathy Lewis  AOZ:[id number] DOB: 1996-03-24 DOA: 02/19/2023 PCP: Loura Pardon, PA   Brief Narrative: Kathy Lewis is a 27 y.o. female with a history of ESRD on hemodialysis, bipolar disorder, diabetes mellitus type 1, anxiety, depression.  Patient presented secondary to abdominal pain and nausea and vomiting without obvious etiology.  Symptomatic management.   Assessment/Plan:  Abdominal pain Unclear etiology.  Patient has been workup in the past for this issue.  Upper endoscopy from August 2023 without etiology for symptoms with unremarkable biopsies.  Hypothesis of tics could be related to cyclic vomiting syndrome.  Patient states that she does not smoke marijuana but she does admit that she uses THC containing edible products. -Analgesics PRN -Will advance to carb modified diet and patient will advance as able   Nausea/vomiting -Zofran PRN   ESRD on HD Patient is on a Tuesday, Thursday, Saturday schedule with last hemodialysis on Saturday, 6/22.  Nephrology consulted and will follow for hemodialysis needs while inpatient.   Primary hypertension Severe asymptomatic hypertension Severe hypertension secondary to patient not taking her blood pressure medications today.  Patient is on hydralazine 50 mg twice daily, Imdur 30 mg daily, Coreg 12.5 mg twice daily. -Continue home hydralazine 50 mg twice daily, Imdur 30 mg daily, Coreg 12.5 mg twice daily -Hydralazine IV PRN if unable to take PO -Patient will need insulin on discharge as she has ran out.  Would likely benefit to have medication filled at [facility] pharmacy to ensure she gets it prior to discharge   Diabetes mellitus type 1 Uncontrolled with hyperglycemia.  Hhemoglobin A1C of 9.3%.  No evidence of DKA. Patient is on insulin pump as an outpatient.  Patient desires to not use insulin pump while inpatient. Her basal rate is 0.5 units/hr. Patient having hypoglycemia secondary to no oral  intake. -Decrease by 50% of home regimen; decrease to Semglee 6 units daily -Will Continue SSI sensitive scale q4 hours while having poor oral intake and hold meal coverage for when she is eating more consistently   Depression -Continue Elavil nightly   GERD -Continue Protonix   DVT prophylaxis: Subcutaneous heparin Code Status:   Code Status: Full Code Family Communication: Mother at bedside Disposition Plan: Discharge likely in 24 hours if tolerates advancing oral diet   Consultants:  Nephrology  Procedures:  None  Antimicrobials: None    Subjective: Some improvement in abdominal pain, nausea, vomiting.  Patient has not eaten breakfast.  Hypoglycemia this morning.  Objective: BP (!) 126/105 (BP Location: Right Arm)   Pulse (!) 109   Temp 97.8 F (36.6 C) (Oral)   Resp 18   Ht 5' (1.524 m)   Wt 58.5 kg   SpO2 97%   BMI 25.19 kg/m   Examination:  General exam: Appears calm and comfortable Respiratory system: Clear to auscultation. Respiratory effort normal. Cardiovascular system: S1 & S2 heard, RRR. No murmurs, rubs, gallops or clicks. Gastrointestinal system: Abdomen is nondistended, soft and nontender. No organomegaly or masses felt. Normal bowel sounds heard. Central nervous system: Alert and oriented. No focal neurological deficits. Musculoskeletal: No edema. No calf tenderness Skin: No cyanosis. No rashes Psychiatry: Judgement and insight appear normal. Mood & affect appropriate.    Data Reviewed: I have personally reviewed following labs and imaging studies   Last CBC Lab Results  Component Value Date   WBC 6.1 02/19/2023   HGB 13.3 02/19/2023   HCT 41.7 02/19/2023   MCV 93.1 02/19/2023   MCH 29.7  02/19/2023   RDW 17.9 (H) 02/19/2023   PLT 254 02/19/2023     Last metabolic panel Lab Results  Component Value Date   GLUCOSE 143 (H) 02/19/2023   NA 135 02/19/2023   K 5.0 02/19/2023   CL 102 02/19/2023   CO2 20 (L) 02/19/2023   BUN 44 (H)  02/19/2023   CREATININE 6.97 (H) 02/19/2023   GFRNONAA 8 (L) 02/19/2023   CALCIUM 8.7 (L) 02/19/2023   PHOS 7.8 (H) 10/07/2022   PROT 6.6 02/19/2023   ALBUMIN 3.0 (L) 02/19/2023   BILITOT 1.0 02/19/2023   ALKPHOS 97 02/19/2023   AST 20 02/19/2023   ALT 16 02/19/2023   ANIONGAP 13 02/19/2023     Creatinine Clearance: Estimated Creatinine Clearance: 9.7 mL/min (A) (by C-G formula based on SCr of 6.97 mg/dL (H)).  No results found for this or any previous visit (from the past 240 hour(s)).    Radiology Studies: CT ABDOMEN PELVIS WO CONTRAST  Result Date: 02/19/2023 CLINICAL DATA:  Nonlocalized acute abdominal pain. History of peritoneal dialysis. Congestive heart failure. EXAM: CT ABDOMEN AND PELVIS WITHOUT CONTRAST TECHNIQUE: Multidetector CT imaging of the abdomen and pelvis was performed following the standard protocol without IV contrast. RADIATION DOSE REDUCTION: This exam was performed according to the departmental dose-optimization program which includes automated exposure control, adjustment of the mA and/or kV according to patient size and/or use of iterative reconstruction technique. COMPARISON:  01/11/2023 FINDINGS: Lower chest: subsegmental atelectasis and volume loss at both lung bases. Moderate cardiomegaly with incompletely imaged central line. Trace pericardial fluid is similar. Hepatobiliary: Variant lateral segment left liver lobe extending in the left upper quadrant. Mild hepatomegaly at 18.2 cm. Normal gallbladder, without biliary ductal dilatation. Pancreas: Pancreatic atrophy involving the body and tail. No duct dilatation or acute inflammation. Spleen: Normal in size, without focal abnormality. Adrenals/Urinary Tract: Normal adrenal glands. No renal calculi or hydronephrosis. Resolution of left hydroureter. No bladder calculi. Stomach/Bowel: Normal stomach, without wall thickening. Scattered colonic diverticula. Normal terminal ileum. Appendix not visualized. Normal small  bowel. Vascular/Lymphatic: Normal caliber of the aorta and branch vessels. Markedly age advanced atherosclerosis within pelvic vasculature. No abdominopelvic adenopathy. Reproductive: Normal uterus and adnexa. Other: No free intraperitoneal air. Small volume abdominopelvic fluid is similar. Anasarca. Musculoskeletal: No acute osseous abnormality. Transitional S1 vertebral body. IMPRESSION: 1. No urinary tract calculi or hydronephrosis. Resolution of left hydroureter. 2. Similar small volume abdominopelvic fluid and anasarca, likely related to fluid peritoneal dialysis. 3. Cardiomegaly with trace pericardial fluid, similar. 4. Age advanced atherosclerosis. Electronically Signed   By: Jeronimo Greaves M.D.   On: 02/19/2023 17:26      LOS: 0 days    Jacquelin Hawking, MD [facility] 02/20/2023, 12:27 PM   If 7PM-7AM, please contact night-coverage [email or web address]

## 2023-02-20 NOTE — Consult Note (Signed)
Renal Service Consult Note [facility]  Kathy Lewis 02/20/2023 Kathy Krabbe, MD Requesting Physician: Dr. Caleb Popp, R.   Reason for Consult: ESRD pt w/ recurrent abd pain  HPI: The patient is a 27 y.o. year-old w/ PMH as below who presented to ED 6/24 yesterday for abd pain, N/V for the last 3 days. Last HD was on Sat. TTS HD patient. No fevers, occ shakes. EGD done in Aug 2023 for similar symptoms didn't show any sig abnormalities. Pt is to be admitted. We are asked to see for dialysis.   Pt seen in room. Pt in bed lying flat. Mother is at bedside. Pt c/o abd pain , n/v as above. Has been off and on for some time. No fevers, SOB or cough. No recent HD issues.   ROS - denies CP, no joint pain, no HA, no blurry vision, no rash, no dysuria, no difficulty voiding   Past Medical History  Past Medical History:  Diagnosis Date   Anemia    Anxiety    Bipolar 2 disorder (HCC)    Chronic kidney disease    Chronic systolic (congestive) heart failure (HCC)    Depression    DKA (diabetic ketoacidoses)    ESRD on peritoneal dialysis (HCC)    HSV infection    on valtrex   Hypokalemia    Leukocytosis    Migraine    Noncompliance with medication regimen    Preeclampsia    Prolonged QT syndrome    Severe anemia    Type 1 diabetes mellitus (HCC)    Past Surgical History  Past Surgical History:  Procedure Laterality Date   BIOPSY  04/24/2022   Procedure: BIOPSY;  Surgeon: Jenel Lucks, MD;  Location: [facility] ENDOSCOPY;  Service: Gastroenterology;;   CARDIAC CATHETERIZATION     COLONOSCOPY WITH PROPOFOL N/A 04/24/2022   Procedure: COLONOSCOPY WITH PROPOFOL;  Surgeon: Jenel Lucks, MD;  Location: [facility] ENDOSCOPY;  Service: Gastroenterology;  Laterality: N/A;   DILATION AND EVACUATION N/A 10/22/2019   Procedure: ULTRASOUND GUIDED DILATATION AND EVACUATION;  Surgeon: Geryl Rankins, MD;  Location: [facility] LD ORS;  Service: Gynecology;  Laterality: N/A;    ESOPHAGOGASTRODUODENOSCOPY (EGD) WITH PROPOFOL N/A 04/24/2022   Procedure: ESOPHAGOGASTRODUODENOSCOPY (EGD) WITH PROPOFOL;  Surgeon: Jenel Lucks, MD;  Location: [facility] ENDOSCOPY;  Service: Gastroenterology;  Laterality: N/A;   IR FLUORO GUIDE CV LINE RIGHT  09/14/2022   IR US GUIDE VASC ACCESS RIGHT  09/14/2022   peritoneal dialysis catheter insertion     RENAL BIOPSY     Family History  Family History  Adopted: Yes  Problem Relation Age of Onset   Heart disease Neg Hx    Social History  reports that she has quit smoking. Her smoking use included cigars and cigarettes. She has a 2.00 pack-year smoking history. She has never used smokeless tobacco. She reports that she does not drink alcohol and does not use drugs. Allergies  Allergies  Allergen Reactions   Cantaloupe Extract Allergy Skin Test Itching    Mouth itching     Citrullus Vulgaris Itching and Other (See Comments)    Mouth itching  Makes mouth itch , ALL melons    Mouth itching   Food Itching    All melon - mouth itching   Nsaids Other (See Comments) and Itching    Avoid per nephrology  Avoid per nephrology    Avoid per nephrology Avoid per nephrology Avoid per nephrology Avoid per nephrology   Strawberry Extract Itching  Mouth itching   Home medications Prior to Admission medications   Medication Sig Start Date End Date Taking? Authorizing Provider  acetaminophen (TYLENOL) 500 MG tablet Take 1,000 mg by mouth as needed for headache (pain).    [provider]  amitriptyline (ELAVIL) 50 MG tablet Take 1 tablet (50 mg total) by mouth at bedtime. 08/30/22   Shanna Cisco, NP  atorvastatin (LIPITOR) 40 MG tablet Take 40 mg by mouth every evening.    [provider]  B Complex-C-Folic Acid (RENA-VITE RX) 1 MG TABS Take 1 tablet by mouth daily. 01/01/23   [provider]  calcium acetate (PHOSLO) 667 MG capsule Take 1,334 mg by mouth 3 (three) times daily with meals. 09/28/22   [provider]  carvedilol (COREG) 12.5 MG tablet Take 1 tablet (12.5 mg total) by mouth 2 (two) times daily with a meal. 10/08/22 11/20/23  Arrien, York Ram, MD  clonazePAM (KLONOPIN) 0.5 MG tablet Take 1 tablet (0.5 mg total) by mouth 2 (two) times daily as needed for anxiety. 03/16/22   Leroy Sea, MD  ELIQUIS 2.5 MG TABS tablet Take 2.5 mg by mouth 2 (two) times daily.    [provider]  hydrALAZINE (APRESOLINE) 50 MG tablet Take 1 tablet (50 mg total) by mouth in the morning and at bedtime. 10/08/22 10/08/23  Arrien, York Ram, MD  hydrOXYzine (ATARAX) 10 MG tablet Take 10 mg by mouth at bedtime.    [provider]  insulin lispro (HUMALOG) 100 UNIT/ML injection Inject 0.5 Units into the skin See admin instructions. 0.5 units per hour via insulin pump. 01/08/22   [provider]  isosorbide mononitrate (IMDUR) 30 MG 24 hr tablet Take 1 tablet (30 mg total) by mouth daily. 10/09/22 11/20/23  Arrien, York Ram, MD  magnesium oxide (MAG-OX) 400 MG tablet Take 800 mg by mouth daily.    [provider]  metoCLOPramide (REGLAN) 5 MG tablet Take 1 tablet (5 mg total) by mouth as needed for nausea or vomiting. 11/25/22   Dione Booze, MD  omeprazole (PRILOSEC) 20 MG capsule Take 20 mg by mouth daily. 12/26/22   [provider]  pantoprazole (PROTONIX) 40 MG tablet Take 1 tablet (40 mg total) by mouth daily. 10/08/22 11/20/23  Arrien, York Ram, MD  Vitamin D, Ergocalciferol, (DRISDOL) 1.25 MG (50000 UNIT) CAPS capsule Take 50,000 Units by mouth every Sunday.    [provider]  escitalopram (LEXAPRO) 10 MG tablet Take 20 mg by mouth daily.  05/10/20 10/06/20  [provider]  furosemide (LASIX) 40 MG tablet Take 1 tablet (40 mg total) by mouth daily. Patient not taking: Reported on 02/16/2021 10/28/19 02/17/21  Geryl Rankins, MD  lisinopril (ZESTRIL) 10 MG tablet Take 10 mg by mouth daily.  02/23/20 10/06/20  [provider]  spironolactone (ALDACTONE) 25 MG tablet Take 25 mg by mouth daily.  04/20/20 10/06/20  [provider]     Vitals:   02/19/23 2257 02/20/23 0430 02/20/23 0450 02/20/23 0736  BP: (!) 129/93 (!) 140/119 (!) 129/109 (!) 126/105  Pulse: 87   (!) 109  Resp: 16 15  18   Temp: 98.8 F (37.1 C) 98.1 F (36.7 C)  97.8 F (36.6 C)  TempSrc:  Oral  Oral  SpO2: 98% 100%  97%  Weight:      Height:       Exam Gen alert, no distress, small-framed young adult No rash, cyanosis or gangrene Sclera anicteric, throat clear  No jvd or bruits Chest clear bilat to bases, no rales/ wheezing RRR no MRG Abd soft ntnd no mass or ascites +bs GU defer MS no joint effusions or deformity Ext trace LE edema, no wounds or ulcers Neuro is alert, Ox 3 , nf    RIJ TDC intact      Home meds include - phoslo 2 ac, eliquis, insulin lispro, reglan 5 mg prn, prilosec, elavil, lipitor, coreg 12.5 bid, klonopin prn, hydralazine 50 bid, imdur 30 every day, protonix, prns/ vits / supps     OP HD: [facility] [address]  TTS  3.5h  300/600   56.5kg  TDC  Heparin 2000 + 500u/hr    Na 135  K 5.0  CO2 20  BUN 44  creat 6.97   Alb 2.0   Hb 13   WBC 6K    Assessment/ Plan: Abd pain / N/ V - unclear cause. W/u per pmd.  Uncont HTN - BP's high 150 /120 in ED. Improved 125/ 100 today getting home BP lowering medications.  ESRD - on HD TTS. HD today.  HTN/ volume - possibly slight vol excess on exam. 2kg up by wts. Plan UF 3 L goal today.  Anemia esrd - Hb 13, prob some hemoconcentration. Follow.  MBD ckd - CCa in range, add on phos. Cont binders when eating.  DM1 - per pmd Depression - flat affect, takes elavil      Vinson Moselle  MD [facility] 02/20/2023, 12:25 PM  Recent Labs  Lab 02/19/23 1422  HGB 13.3  ALBUMIN 3.0*  CALCIUM 8.7*  CREATININE 6.97*  K 5.0   Inpatient medications:  amitriptyline  50 mg Oral QHS   atorvastatin  40 mg Oral QPM   carvedilol  12.5 mg Oral BID WC   heparin   5,000 Units Subcutaneous Q8H   hydrALAZINE  50 mg Oral BID   insulin aspart  0-6 Units Subcutaneous Q4H   insulin glargine-yfgn  12 Units Subcutaneous QHS   isosorbide mononitrate  30 mg Oral Daily   pantoprazole  40 mg Oral Daily    acetaminophen **OR** acetaminophen, clonazePAM, hydrALAZINE, HYDROmorphone (DILAUDID) injection, ondansetron **OR** ondansetron (ZOFRAN) IV, oxyCODONE

## 2023-02-20 NOTE — Progress Notes (Signed)
VSS. Patient resting in bed comfortably without signs of distress at this time. Will continue to monitor.  

## 2023-02-21 DIAGNOSIS — R112 Nausea with vomiting, unspecified: Secondary | ICD-10-CM | POA: Diagnosis not present

## 2023-02-21 DIAGNOSIS — I82C11 Acute embolism and thrombosis of right internal jugular vein: Secondary | ICD-10-CM | POA: Diagnosis not present

## 2023-02-21 LAB — GLUCOSE, CAPILLARY
Glucose-Capillary: 155 mg/dL — ABNORMAL HIGH (ref 70–99)
Glucose-Capillary: 144 mg/dL — ABNORMAL HIGH (ref 70–99)
Glucose-Capillary: 140 mg/dL — ABNORMAL HIGH (ref 70–99)

## 2023-02-21 LAB — HEPATITIS B SURFACE ANTIBODY, QUANTITATIVE: Hep B S AB Quant (Post): 2770 m[IU]/mL (ref >9.9)

## 2023-02-21 NOTE — Progress Notes (Signed)
D/C order noted. Contracted [facility] and spoke to charge RN. Clinic advised that pt will d/c today and should resume care tomorrow. Clinic has access to [facility] epic in order to obtain needed clinicals.   Olivia Canter Renal Navigator [phone]

## 2023-02-21 NOTE — Discharge Summary (Signed)
Physician Discharge Summary  Kathy Lewis JYN:[id number] DOB: 07/23/1996 DOA: 02/19/2023  PCP: Loura Pardon, PA  Admit date: 02/19/2023 Discharge date: 02/21/2023  Admitted From: Home  Discharge disposition: Home   Recommendations for Outpatient Follow-Up:   Follow up with your primary care provider in one week.  Check CBC, BMP, magnesium in the next visit  Discharge Diagnosis:   Principal Problem:   Intractable nausea and vomiting Active Problems:   Internal jugular vein thrombosis, right (HCC)   Discharge Condition: Improved.  Diet recommendation:  Carbohydrate-modified.    Wound care: None.  Code status: Full.   History of Present Illness:   Kathy Lewis is a 27 y.o. female with a history of ESRD on hemodialysis, bipolar disorder, diabetes mellitus type 1, anxiety, depression presented to hospital with abdominal pain nausea and vomiting.Marland Kitchen    Hospital Course:   Following conditions were addressed during hospitalization as listed below,  Abdominal pain likely secondary to gastroparesis. Patient has been workup in the past for this issue.  Upper endoscopy from August 2023 without etiology for symptoms with unremarkable biopsies.  Patient states that she does not smoke marijuana but she does admit that she uses THC containing edible products.  Counseled against using THC. - Nausea/vomiting Improved.  Has mild nausea.  Patient does have Zofran and Reglan at home.  Patient likely has gastroparesis.  Patient was advised on oral diet and as tolerated at this time.   ESRD on HD Patient is on a Tuesday, Thursday, Saturday schedule with last hemodialysis on Saturday, 6/22.  Nephrology was consulted for hemodialysis needs.   Primary hypertension/hypertensive urgency. on hydralazine 50 mg twice daily, Imdur 30 mg daily, Coreg 12.5 mg twice daily.  Will be resumed on discharge.   Diabetes mellitus type 1 with hyperglycemia. Latest hemoglobin A1C of 9.3%.  No  evidence of DKA. Patient is on insulin pump as an outpatient.  Will resume on discharge.  Depression -Continue Elavil    GERD -Continue PPI on discharge.   Disposition.  At this time, patient is stable for disposition home with outpatient PCP follow-up.  Medical Consultants:   None.  Procedures:    None Subjective:   Today, patient was seen and examined at bedside.  Has mild nausea but was able to tolerate oral diet.  Discharge Exam:   Vitals:   02/21/23 0745 02/21/23 0900  BP: (!) 130/106 (!) 116/90  Pulse: 88   Resp: 18   Temp: 98 F (36.7 C)   SpO2: 100%    Vitals:   02/20/23 2119 02/21/23 0429 02/21/23 0745 02/21/23 0900  BP: (!) 127/97 122/88 (!) 130/106 (!) 116/90  Pulse: 80 87 88   Resp:  18 18   Temp:  98 F (36.7 C) 98 F (36.7 C)   TempSrc:      SpO2: 96% 96% 100%   Weight:      Height:      Body mass index is 28.93 kg/m.   General: Alert awake, not in obvious distress HENT: pupils equally reacting to light,  No scleral pallor or icterus noted. Oral mucosa is moist.  Chest:  Clear breath sounds. No crackles or wheezes.  CVS: S1 &S2 heard. No murmur.  Regular rate and rhythm. Abdomen: Soft, nontender, nondistended.  Bowel sounds are heard.   Extremities: No cyanosis, clubbing or edema.  Peripheral pulses are palpable. Psych: Alert, awake and oriented, normal mood CNS:  No cranial nerve deficits.  Power equal in all extremities.   Skin:  Warm and dry.  No rashes noted.  The results of significant diagnostics from this hospitalization (including imaging, microbiology, ancillary and laboratory) are listed below for reference.     Diagnostic Studies:   CT ABDOMEN PELVIS WO CONTRAST  Result Date: 02/19/2023 CLINICAL DATA:  Nonlocalized acute abdominal pain. History of peritoneal dialysis. Congestive heart failure. EXAM: CT ABDOMEN AND PELVIS WITHOUT CONTRAST TECHNIQUE: Multidetector CT imaging of the abdomen and pelvis was performed following the  standard protocol without IV contrast. RADIATION DOSE REDUCTION: This exam was performed according to the departmental dose-optimization program which includes automated exposure control, adjustment of the mA and/or kV according to patient size and/or use of iterative reconstruction technique. COMPARISON:  01/11/2023 FINDINGS: Lower chest: subsegmental atelectasis and volume loss at both lung bases. Moderate cardiomegaly with incompletely imaged central line. Trace pericardial fluid is similar. Hepatobiliary: Variant lateral segment left liver lobe extending in the left upper quadrant. Mild hepatomegaly at 18.2 cm. Normal gallbladder, without biliary ductal dilatation. Pancreas: Pancreatic atrophy involving the body and tail. No duct dilatation or acute inflammation. Spleen: Normal in size, without focal abnormality. Adrenals/Urinary Tract: Normal adrenal glands. No renal calculi or hydronephrosis. Resolution of left hydroureter. No bladder calculi. Stomach/Bowel: Normal stomach, without wall thickening. Scattered colonic diverticula. Normal terminal ileum. Appendix not visualized. Normal small bowel. Vascular/Lymphatic: Normal caliber of the aorta and branch vessels. Markedly age advanced atherosclerosis within pelvic vasculature. No abdominopelvic adenopathy. Reproductive: Normal uterus and adnexa. Other: No free intraperitoneal air. Small volume abdominopelvic fluid is similar. Anasarca. Musculoskeletal: No acute osseous abnormality. Transitional S1 vertebral body. IMPRESSION: 1. No urinary tract calculi or hydronephrosis. Resolution of left hydroureter. 2. Similar small volume abdominopelvic fluid and anasarca, likely related to fluid peritoneal dialysis. 3. Cardiomegaly with trace pericardial fluid, similar. 4. Age advanced atherosclerosis. Electronically Signed   By: Jeronimo Greaves M.D.   On: 02/19/2023 17:26     Labs:   Basic Metabolic Panel: Recent Labs  Lab 02/19/23 1422 02/20/23 1245  NA 135  --    K 5.0  --   CL 102  --   CO2 20*  --   GLUCOSE 143*  --   BUN 44*  --   CREATININE 6.97*  --   CALCIUM 8.7*  --   PHOS  --  6.0*   GFR Estimated Creatinine Clearance: 10.4 mL/min (A) (by C-G formula based on SCr of 6.97 mg/dL (H)). Liver Function Tests: Recent Labs  Lab 02/19/23 1422  AST 20  ALT 16  ALKPHOS 97  BILITOT 1.0  PROT 6.6  ALBUMIN 3.0*   Recent Labs  Lab 02/19/23 1422  LIPASE 20   No results for input(s): "AMMONIA" in the last 168 hours. Coagulation profile No results for input(s): "INR", "PROTIME" in the last 168 hours.  CBC: Recent Labs  Lab 02/19/23 1422  WBC 6.1  HGB 13.3  HCT 41.7  MCV 93.1  PLT 254   Cardiac Enzymes: No results for input(s): "CKTOTAL", "CKMB", "CKMBINDEX", "TROPONINI" in the last 168 hours. BNP: Invalid input(s): "POCBNP" CBG: Recent Labs  Lab 02/20/23 1849 02/20/23 1935 02/21/23 0112 02/21/23 0431 02/21/23 0747  GLUCAP 41* 108* 155* 144* 140*   D-Dimer No results for input(s): "DDIMER" in the last 72 hours. Hgb A1c Recent Labs    02/20/23 0422  HGBA1C 9.3*   Lipid Profile No results for input(s): "CHOL", "HDL", "LDLCALC", "TRIG", "CHOLHDL", "LDLDIRECT" in the last 72 hours. Thyroid function studies No results for input(s): "TSH", "T4TOTAL", "T3FREE", "THYROIDAB" in the last  72 hours.  Invalid input(s): "FREET3" Anemia work up No results for input(s): "VITAMINB12", "FOLATE", "FERRITIN", "TIBC", "IRON", "RETICCTPCT" in the last 72 hours. Microbiology No results found for this or any previous visit (from the past 240 hour(s)).   Discharge Instructions:   Discharge Instructions     Call MD for:  persistant nausea and vomiting   Complete by: As directed    Call MD for:  severe uncontrolled pain   Complete by: As directed    Diet Carb Modified   Complete by: As directed    Discharge instructions   Complete by: As directed    Avoid fatty fried greasy cheesy food.  Small frequent meals 4-5  times a  day.  Follow-up with your primary care provider in 1 week.  Seek medical attention for worsening symptoms.   Increase activity slowly   Complete by: As directed       Allergies as of 02/21/2023       Reactions   Cantaloupe Extract Allergy Skin Test Itching   Mouth itching   Citrullus Vulgaris Itching, Other (See Comments)   Mouth itching Makes mouth itch , ALL melons    Mouth itching   Food Itching   All melon - mouth itching   Nsaids Other (See Comments), Itching   Avoid per nephrology Avoid per nephrology    Avoid per nephrology Avoid per nephrology Avoid per nephrology Avoid per nephrology   Strawberry Extract Itching   Mouth itching        Medication List     STOP taking these medications    omeprazole 20 MG capsule Commonly known as: PRILOSEC       TAKE these medications    acetaminophen 500 MG tablet Commonly known as: TYLENOL Take 1,000 mg by mouth as needed for headache (pain).   amitriptyline 50 MG tablet Commonly known as: ELAVIL Take 1 tablet (50 mg total) by mouth at bedtime.   atorvastatin 40 MG tablet Commonly known as: LIPITOR Take 40 mg by mouth every evening.   calcium acetate 667 MG capsule Commonly known as: PHOSLO Take 1,334 mg by mouth 3 (three) times daily with meals.   carvedilol 12.5 MG tablet Commonly known as: COREG Take 1 tablet (12.5 mg total) by mouth 2 (two) times daily with a meal.   clonazePAM 0.5 MG tablet Commonly known as: KLONOPIN Take 1 tablet (0.5 mg total) by mouth 2 (two) times daily as needed for anxiety.   Eliquis 2.5 MG Tabs tablet Generic drug: apixaban Take 2.5 mg by mouth 2 (two) times daily.   hydrALAZINE 50 MG tablet Commonly known as: APRESOLINE Take 1 tablet (50 mg total) by mouth in the morning and at bedtime.   hydrOXYzine 10 MG tablet Commonly known as: ATARAX Take 10 mg by mouth at bedtime.   insulin lispro 100 UNIT/ML injection Commonly known as: HUMALOG Inject 0.5 Units into the skin  See admin instructions. 0.5 units per hour via insulin pump.   isosorbide mononitrate 30 MG 24 hr tablet Commonly known as: IMDUR Take 1 tablet (30 mg total) by mouth daily.   magnesium oxide 400 MG tablet Commonly known as: MAG-OX Take 800 mg by mouth daily.   metoCLOPramide 5 MG tablet Commonly known as: REGLAN Take 1 tablet (5 mg total) by mouth as needed for nausea or vomiting.   pantoprazole 40 MG tablet Commonly known as: PROTONIX Take 1 tablet (40 mg total) by mouth daily.   Rena-Vite Rx 1 MG Tabs Take 1  tablet by mouth daily.   Vitamin D (Ergocalciferol) 1.25 MG (50000 UNIT) Caps capsule Commonly known as: DRISDOL Take 50,000 Units by mouth every Sunday.        Follow-up Information     Loura Pardon, PA Follow up in 1 week(s).   Specialty: Physician Assistant Contact information: [address] [place] [address] [phone]                  Time coordinating discharge: 39 minutes  Signed:  Treniya Lobb  [facility] 02/21/2023, 11:45 AM

## 2023-02-21 NOTE — Progress Notes (Signed)
  [facility] Progress Note   Subjective: Seen in room. Completed dialysis yesterday net UF 3L. Feeling better, no nausea/vomiting this am, trying to eat breakfast.   Objective Vitals:   02/20/23 2119 02/21/23 0429 02/21/23 0745 02/21/23 0900  BP: (!) 127/97 122/88 (!) 130/106 (!) 116/90  Pulse: 80 87 88   Resp:  18 18   Temp:  98 F (36.7 C) 98 F (36.7 C)   TempSrc:      SpO2: 96% 96% 100%   Weight:      Height:         Additional Objective Labs: Basic Metabolic Panel: Recent Labs  Lab 02/19/23 1422 02/20/23 1245  NA 135  --   K 5.0  --   CL 102  --   CO2 20*  --   GLUCOSE 143*  --   BUN 44*  --   CREATININE 6.97*  --   CALCIUM 8.7*  --   PHOS  --  6.0*   CBC: Recent Labs  Lab 02/19/23 1422  WBC 6.1  HGB 13.3  HCT 41.7  MCV 93.1  PLT 254   Blood Culture    Component Value Date/Time   SDES BLOOD LEFT HAND 10/20/2022 1837   SPECREQUEST  10/20/2022 1837    BOTTLES DRAWN AEROBIC AND ANAEROBIC Blood Culture results may not be optimal due to an inadequate volume of blood received in culture bottles   CULT  10/20/2022 1837    NO GROWTH 6 DAYS Performed at [facility], [address], [place], [place] [address]    REPTSTATUS 10/26/2022 FINAL 10/20/2022 1837     Physical Exam General: Young woman, nad  Heart: RRR Lungs: Clear bilaterally  Abdomen: soft non-tender Extremities: No LE edema Dialysis Access: R TDC in place   Medications:   amitriptyline  50 mg Oral QHS   apixaban  2.5 mg Oral BID   atorvastatin  40 mg Oral QPM   carvedilol  12.5 mg Oral BID WC   Chlorhexidine Gluconate Cloth  6 each Topical Q0600   hydrALAZINE  50 mg Oral BID   insulin aspart  0-6 Units Subcutaneous Q4H   insulin glargine-yfgn  6 Units Subcutaneous QHS   isosorbide mononitrate  30 mg Oral Daily   pantoprazole  40 mg Oral Daily     OP HD: [facility] [address]  TTS  3.5h  300/600   56.5kg  TDC  Heparin 2000 + 500u/hr     Na 135  K 5.0  CO2 20   BUN 44  creat 6.97   Alb 2.0   Hb 13   WBC 6K     Assessment/ Plan: Abd pain / N/ V - unclear cause. W/u per pmd. Chronic issue, better today  Uncont HTN - BP's high 150 /120 in ED. Improved this am. Continue home meds.  ESRD - on HD TTS. Next HD 6/27  HTN/ volume - BP ok this am/Weights variable. Net 3L with HD 6/25  Anemia esrd - Hb 13, prob some hemoconcentration. Follow.  MBD ckd - CCa in range. Phos above goal.  Cont binders when eating.  DM1 - per pmd. Uses insulin pump at home  Depression - flat affect, takes elavil  Tomasa Blase PA-C [facility] 02/21/2023,10:06 AM

## 2023-03-03 ENCOUNTER — Other Ambulatory Visit: Payer: Self-pay

## 2023-03-03 ENCOUNTER — Encounter (HOSPITAL_BASED_OUTPATIENT_CLINIC_OR_DEPARTMENT_OTHER): Payer: Self-pay | Admitting: Emergency Medicine

## 2023-03-03 ENCOUNTER — Emergency Department (HOSPITAL_BASED_OUTPATIENT_CLINIC_OR_DEPARTMENT_OTHER)
Admission: EM | Admit: 2023-03-03 | Discharge: 2023-03-03 | Disposition: A | Discharge status: Home / Self Care | Payer: Medicaid Other | Attending: Emergency Medicine | Admitting: Emergency Medicine

## 2023-03-03 DIAGNOSIS — N186 End stage renal disease: Secondary | ICD-10-CM | POA: Insufficient documentation

## 2023-03-03 DIAGNOSIS — Z992 Dependence on renal dialysis: Secondary | ICD-10-CM | POA: Diagnosis not present

## 2023-03-03 DIAGNOSIS — I132 Hypertensive heart and chronic kidney disease with heart failure and with stage 5 chronic kidney disease, or end stage renal disease: Secondary | ICD-10-CM | POA: Diagnosis not present

## 2023-03-03 DIAGNOSIS — E1022 Type 1 diabetes mellitus with diabetic chronic kidney disease: Secondary | ICD-10-CM | POA: Diagnosis not present

## 2023-03-03 DIAGNOSIS — Z7901 Long term (current) use of anticoagulants: Secondary | ICD-10-CM | POA: Insufficient documentation

## 2023-03-03 DIAGNOSIS — G8929 Other chronic pain: Secondary | ICD-10-CM | POA: Diagnosis not present

## 2023-03-03 DIAGNOSIS — Z794 Long term (current) use of insulin: Secondary | ICD-10-CM | POA: Diagnosis not present

## 2023-03-03 DIAGNOSIS — R112 Nausea with vomiting, unspecified: Secondary | ICD-10-CM | POA: Insufficient documentation

## 2023-03-03 DIAGNOSIS — I5022 Chronic systolic (congestive) heart failure: Secondary | ICD-10-CM | POA: Insufficient documentation

## 2023-03-03 DIAGNOSIS — Z79899 Other long term (current) drug therapy: Secondary | ICD-10-CM | POA: Diagnosis not present

## 2023-03-03 DIAGNOSIS — R1013 Epigastric pain: Secondary | ICD-10-CM | POA: Diagnosis not present

## 2023-03-03 LAB — COMPREHENSIVE METABOLIC PANEL
ALT: 18 U/L (ref 0–44)
AST: 18 U/L (ref 15–41)
Albumin: 3 g/dL — ABNORMAL LOW (ref 3.5–5.0)
Alkaline Phosphatase: 86 U/L (ref 38–126)
Anion gap: 11 (ref 5–15)
BUN: 27 mg/dL — ABNORMAL HIGH (ref 6–20)
CO2: 23 mmol/L (ref 22–32)
Calcium: 8.9 mg/dL (ref 8.9–10.3)
Chloride: 102 mmol/L (ref 98–111)
Creatinine, Ser: 4.23 mg/dL — ABNORMAL HIGH (ref 0.44–1.00)
GFR, Estimated: 14 mL/min — ABNORMAL LOW (ref >60)
Glucose, Bld: 176 mg/dL — ABNORMAL HIGH (ref 70–99)
Potassium: 4 mmol/L (ref 3.5–5.1)
Sodium: 136 mmol/L (ref 135–145)
Total Bilirubin: 0.7 mg/dL (ref 0.3–1.2)
Total Protein: 7.1 g/dL (ref 6.5–8.1)

## 2023-03-03 LAB — CBC WITH DIFFERENTIAL/PLATELET
Abs Immature Granulocytes: 0.01 10*3/uL (ref 0.00–0.07)
Basophils Absolute: 0 10*3/uL (ref 0.0–0.1)
Basophils Relative: 1 %
Eosinophils Absolute: 0.1 10*3/uL (ref 0.0–0.5)
Eosinophils Relative: 1 %
HCT: 35.5 % — ABNORMAL LOW (ref 36.0–46.0)
Hemoglobin: 11.3 g/dL — ABNORMAL LOW (ref 12.0–15.0)
Immature Granulocytes: 0 %
Lymphocytes Relative: 26 %
Lymphs Abs: 0.9 10*3/uL (ref 0.7–4.0)
MCH: 30.4 pg (ref 26.0–34.0)
MCHC: 31.8 g/dL (ref 30.0–36.0)
MCV: 95.4 fL (ref 80.0–100.0)
Monocytes Absolute: 0.3 10*3/uL (ref 0.1–1.0)
Monocytes Relative: 9 %
Neutro Abs: 2.2 10*3/uL (ref 1.7–7.7)
Neutrophils Relative %: 63 %
Platelets: 235 10*3/uL (ref 150–400)
RBC: 3.72 MIL/uL — ABNORMAL LOW (ref 3.87–5.11)
RDW: 17.5 % — ABNORMAL HIGH (ref 11.5–15.5)
WBC: 3.6 10*3/uL — ABNORMAL LOW (ref 4.0–10.5)
nRBC: 0 % (ref 0.0–0.2)

## 2023-03-03 LAB — LIPASE, BLOOD: Lipase: 25 U/L (ref 11–51)

## 2023-03-03 LAB — HCG, QUANTITATIVE, PREGNANCY: hCG, Beta Chain, Quant, S: 2 m[IU]/mL (ref <5)

## 2023-03-03 MED ORDER — LORAZEPAM 2 MG/ML IJ SOLN
0.5000 mg | Freq: Once | INTRAMUSCULAR | Status: AC
  Administered 2023-03-03: 0.5 mg via INTRAVENOUS
  Filled 2023-03-03: qty 1

## 2023-03-03 MED ORDER — ACETAMINOPHEN 500 MG PO TABS
1000.0000 mg | ORAL_TABLET | Freq: Once | ORAL | Status: AC
  Administered 2023-03-03: 1000 mg via ORAL
  Filled 2023-03-03: qty 2

## 2023-03-03 MED ORDER — METOCLOPRAMIDE HCL 5 MG/ML IJ SOLN
10.0000 mg | Freq: Once | INTRAMUSCULAR | Status: AC
  Administered 2023-03-03: 10 mg via INTRAVENOUS
  Filled 2023-03-03: qty 2

## 2023-03-03 MED ORDER — METOCLOPRAMIDE HCL 5 MG PO TABS: 5.0000 mg | ORAL_TABLET | Freq: Three times a day (TID) | ORAL | 2 refills | Status: DC | PRN

## 2023-03-03 MED ORDER — FENTANYL CITRATE PF 50 MCG/ML IJ SOSY
25.0000 ug | PREFILLED_SYRINGE | Freq: Once | INTRAMUSCULAR | Status: AC
  Administered 2023-03-03: 25 ug via INTRAVENOUS
  Filled 2023-03-03: qty 1

## 2023-03-03 NOTE — ED Notes (Signed)
ED Provider at bedside. 

## 2023-03-03 NOTE — Discharge Instructions (Addendum)
Thank you for coming to [facility] Emergency Department. You were seen for nausea/vomiting, abdominal pain. We did an exam, labs, and imaging, and these showed a mild decrease in your hemoglobin. Please have this level rechecked in the next 1-2 weeks. We have refilled your reglan at home.  Please follow up with your primary care provider and/or gastroenterologist within 1-2 weeks.   Do not hesitate to return to the ED or call 911 if you experience: -Worsening symptoms -Nausea/vomiting so severe you cannot eat/drink anything -Lightheadedness, passing out -Fevers/chills -Anything else that concerns you

## 2023-03-03 NOTE — ED Provider Notes (Signed)
[place] [facility] Provider Note   CSN: [id number] Arrival date & time: 03/03/23  1334     History  Chief Complaint  Patient presents with   Emesis    Kathy Lewis is a 27 y.o. female with ESRD on HD Tuesday Thursday Saturday, T1DM on insulin pump w/ h/o DKA, HTN, h/o PE, prolonged QT, gastroparesis, GERD, chronic systolic CHF, elevated LFTs, seizure-like activity, R internal jugular vein thrombosis, HLD who presents with N/V x months; c/o epigastric pain as well that is the same as it always is.  She states the pain never really goes away and after her last admission it did not really go away.  She has been nauseous and vomiting for several days.  She had dialysis this morning but states she was only able to get approximately half to three quarters of the way through because she was in so nauseated.  She offers that her nausea improved in the shower and gets worse with any eating.  She used to eat delta-8 edibles but hasn't in one month, doesn't smoke THC. Tried protonix which also didn't help. Per chart review patient was admitted from 02/19/2023 to 02/21/2023 for intractable nausea vomiting likely secondary to gastroparesis.  She had upper endoscopy in August 2023 without etiology for symptoms.  Denies smoking marijuana but endorsed that she used THC edibles.  Has Zofran and Reglan at home but they don't help.  She does not make urine.  Denies any fevers or chills. States she was tested for gastroparesis and they said she didn't have it.   Emesis      Home Medications Prior to Admission medications   Medication Sig Start Date End Date Taking? Authorizing Provider  acetaminophen (TYLENOL) 500 MG tablet Take 1,000 mg by mouth as needed for headache (pain).    [provider]  amitriptyline (ELAVIL) 50 MG tablet Take 1 tablet (50 mg total) by mouth at bedtime. 08/30/22   Shanna Cisco, NP  atorvastatin (LIPITOR) 40 MG tablet Take 40 mg by  mouth every evening.    [provider]  B Complex-C-Folic Acid (RENA-VITE RX) 1 MG TABS Take 1 tablet by mouth daily. 01/01/23   [provider]  calcium acetate (PHOSLO) 667 MG capsule Take 1,334 mg by mouth 3 (three) times daily with meals. 09/28/22   [provider]  carvedilol (COREG) 12.5 MG tablet Take 1 tablet (12.5 mg total) by mouth 2 (two) times daily with a meal. 10/08/22 11/20/23  Arrien, York Ram, MD  clonazePAM (KLONOPIN) 0.5 MG tablet Take 1 tablet (0.5 mg total) by mouth 2 (two) times daily as needed for anxiety. 03/16/22   Leroy Sea, MD  ELIQUIS 2.5 MG TABS tablet Take 2.5 mg by mouth 2 (two) times daily.    [provider]  hydrALAZINE (APRESOLINE) 50 MG tablet Take 1 tablet (50 mg total) by mouth in the morning and at bedtime. 10/08/22 10/08/23  Arrien, York Ram, MD  hydrOXYzine (ATARAX) 10 MG tablet Take 10 mg by mouth at bedtime.    [provider]  insulin lispro (HUMALOG) 100 UNIT/ML injection Inject 0.5 Units into the skin See admin instructions. 0.5 units per hour via insulin pump. 01/08/22   [provider]  isosorbide mononitrate (IMDUR) 30 MG 24 hr tablet Take 1 tablet (30 mg total) by mouth daily. 10/09/22 11/20/23  Arrien, York Ram, MD  magnesium oxide (MAG-OX) 400 MG tablet Take 800 mg by mouth daily.  [provider]  metoCLOPramide (REGLAN) 5 MG tablet Take 1 tablet (5 mg total) by mouth every 8 (eight) hours as needed for nausea or vomiting. 03/03/23 04/02/23  Loetta Rough, MD  pantoprazole (PROTONIX) 40 MG tablet Take 1 tablet (40 mg total) by mouth daily. 10/08/22 11/20/23  Arrien, York Ram, MD  Vitamin D, Ergocalciferol, (DRISDOL) 1.25 MG (50000 UNIT) CAPS capsule Take 50,000 Units by mouth every Sunday.    [provider]  escitalopram (LEXAPRO) 10 MG tablet Take 20 mg by mouth daily.  05/10/20 10/06/20  [provider]  furosemide (LASIX) 40 MG tablet Take 1  tablet (40 mg total) by mouth daily. Patient not taking: Reported on 02/16/2021 10/28/19 02/17/21  Geryl Rankins, MD  lisinopril (ZESTRIL) 10 MG tablet Take 10 mg by mouth daily.  02/23/20 10/06/20  [provider]  spironolactone (ALDACTONE) 25 MG tablet Take 25 mg by mouth daily.  04/20/20 10/06/20  [provider]      Allergies    Cantaloupe extract allergy skin test, Citrullus vulgaris, Food, Nsaids, and Strawberry extract    Review of Systems   Review of Systems  Gastrointestinal:  Positive for vomiting.   A 10 point review of systems was performed and is negative unless otherwise reported in HPI.  Physical Exam Updated Vital Signs BP (!) 125/100   Pulse 86   Temp 98.2 F (36.8 C) (Oral)   Resp 15   Ht 5' (1.524 m)   Wt 67.2 kg   SpO2 99%   BMI 28.93 kg/m  Physical Exam General: Normal appearing female, lying in bed.  HEENT: PERRLA, Sclera anicteric, MMM, trachea midline.  Cardiology: RRR, no murmurs/rubs/gallops. BL radial and DP pulses equal bilaterally.  Resp: Normal respiratory rate and effort. CTAB, no wheezes, rhonchi, crackles.  Abd: Diffusely mildly tender to palpation, Soft, non-distended. No rebound tenderness or guarding.  GU: Deferred. MSK: No peripheral edema or signs of trauma. Extremities without deformity or TTP. No cyanosis or clubbing. Skin: warm, dry. No rashes or lesions. Back: No CVA tenderness Neuro: A&Ox4, CNs II-XII grossly intact. MAEs. Sensation grossly intact.  Psych: Normal mood and affect.   ED Results / Procedures / Treatments   Labs (all labs ordered are listed, but only abnormal results are displayed) Labs Reviewed  CBC WITH DIFFERENTIAL/PLATELET - Abnormal; Notable for the following components:      Result Value   WBC 3.6 (*)    RBC 3.72 (*)    Hemoglobin 11.3 (*)    HCT 35.5 (*)    RDW 17.5 (*)    All other components within normal limits  COMPREHENSIVE METABOLIC PANEL - Abnormal; Notable for the following  components:   Glucose, Bld 176 (*)    BUN 27 (*)    Creatinine, Ser 4.23 (*)    Albumin 3.0 (*)    GFR, Estimated 14 (*)    All other components within normal limits  LIPASE, BLOOD  HCG, QUANTITATIVE, PREGNANCY  OCCULT BLOOD X 1 CARD TO LAB, STOOL    EKG EKG Interpretation Date/Time:  Saturday March 03 2023 15:54:00 EDT Ventricular Rate:  83 PR Interval:  140 QRS Duration:  90 QT Interval:  412 QTC Calculation: 485 R Axis:   264  Text Interpretation: Sinus rhythm Left atrial enlargement Borderline prolonged QT interval ST changes similar to prior EKGs Confirmed by Vivi Barrack [id number]) on 03/03/2023 4:31:29 PM  Radiology No results found.  Procedures Procedures    Medications Ordered in ED Medications  LORazepam (ATIVAN) injection 0.5 mg (0.5 mg Intravenous Given 03/03/23 1602)  fentaNYL (SUBLIMAZE) injection 25 mcg (25 mcg Intravenous Given 03/03/23 1611)  metoCLOPramide (REGLAN) injection 10 mg (10 mg Intravenous Given 03/03/23 1614)  acetaminophen (TYLENOL) tablet 1,000 mg (1,000 mg Oral Given 03/03/23 2005)    ED Course/ Medical Decision Making/ A&P                          Medical Decision Making Amount and/or Complexity of Data Reviewed Labs: ordered. Decision-making details documented in ED Course.  Risk OTC drugs. Prescription drug management.    This patient presents to the ED for concern of nausea vomiting abdominal pain, this involves an extensive number of treatment options, and is a complaint that carries with it a high risk of complications and morbidity.  I considered the following differential and admission for this acute, potentially life threatening condition.   MDM:    DDX for this patient's nausea/vomiting includes but is not limited to:  Patient with chronic nausea vomiting with recent hospitalization for intractable nausea vomiting presents with the same with the same abdominal pain.  During her last admission she did have a CT of the pelvis that  was unrevealing.  She has the same abdominal pain today, not any worse, she is afebrile and vitally stable, her abdomen is soft and diffusely tender however there is no signs of acute abdomen and I do not believe she needs another CT abdomen pelvis today, overall low concern for intra-abdominal infection such as appendicitis, diverticulitis, cholecystitis.  Also lower concern for small bowel obstruction or ileus.  Lipase negative, no concern for pancreatitis. Consider acute life-threatening diagnoses including ACS, DKA.  Glucose is 176 without any elevated anion gap or decrease in bicarb, no concern for DKA.  Consider electrolyte abnormalities or renal injury due to dehydration/hypovolemia.  Consider cannabinoid hyperemesis syndrome given her reported marijuana use in the form of edibles however patient states that she does not use them anymore.  I have encouraged her to continue to stop as this could be contributing, especially with reported improvement in the shower which is classic for CHS.  With patient's prolonged QT and CKD will treat her pain with fentanyl, and nausea with Ativan.  She also has Reglan at home and will give Reglan here IV as well.     Clinical Course as of 03/03/23 2029  Sat Mar 03, 2023  1611 QTc today 485 - she already received zofran at [facility], will try reglan IV here as well [HN]  1632 Hemoglobin(!): 11.3 Hgb has dropped approx 1.5 points since 12 days ago. Pt denies any hematochezia/melena. She states that it has been as low as 8 in the past d/t kidney disease, no h/o GIB. She is HDS. Patient can have this level rechecked with PCP/GI doctor and she reports understanding. [HN]  2023 Patient is reevaluated, she states she feels improved from when she arrived.  She is ambulatory around the room and has taken p.o.  Patient will follow-up with her GI doctor in the next 1 to 2 weeks about her chronic nausea vomiting and abdominal pain.  She is discharged with discharge instructions and  return precautions, as well as refill of her home Reglan.  All questions answered to patient and her mother's satisfaction. [HN]    Clinical Course User Index [HN] Loetta Rough, MD    Labs: I Ordered, and personally interpreted labs.  The pertinent results include: Those listed  above.  Creatinine 4.23 in setting of known ESRD on HD.  Electrolytes okay.  Glucose 176 without elevated anion gap.  Additional history obtained from chart review, mother at bedside.  Cardiac Monitoring: The patient was maintained on a cardiac monitor.  I personally viewed and interpreted the cardiac monitored which showed an underlying rhythm of: Normal sinus rhythm  Reevaluation: After the interventions noted above, I reevaluated the patient and found that they have :improved  Social Determinants of Health: Patient lives independently   Disposition:  DC  Co morbidities that complicate the patient evaluation  Past Medical History:  Diagnosis Date   Anemia    Anxiety    Bipolar 2 disorder (HCC)    Chronic kidney disease    Chronic systolic (congestive) heart failure (HCC)    Depression    DKA (diabetic ketoacidoses)    ESRD on peritoneal dialysis (HCC)    HSV infection    on valtrex   Hypokalemia    Leukocytosis    Migraine    Noncompliance with medication regimen    Preeclampsia    Prolonged QT syndrome    Severe anemia    Type 1 diabetes mellitus (HCC)      Medicines Meds ordered this encounter  Medications   LORazepam (ATIVAN) injection 0.5 mg   fentaNYL (SUBLIMAZE) injection 25 mcg   metoCLOPramide (REGLAN) injection 10 mg   acetaminophen (TYLENOL) tablet 1,000 mg   metoCLOPramide (REGLAN) 5 MG tablet    Sig: Take 1 tablet (5 mg total) by mouth every 8 (eight) hours as needed for nausea or vomiting.    Dispense:  30 tablet    Refill:  2    I have reviewed the patients home medicines and have made adjustments as needed  Problem List / ED Course: Problem List Items Addressed  This Visit       Digestive   Nausea and vomiting - Primary   Other Visit Diagnoses     Abdominal pain, chronic, epigastric       Relevant Medications   fentaNYL (SUBLIMAZE) injection 25 mcg (Completed)   acetaminophen (TYLENOL) tablet 1,000 mg (Completed)                   This note was created using dictation software, which may contain spelling or grammatical errors.    Loetta Rough, MD 03/03/23 2030

## 2023-03-03 NOTE — ED Triage Notes (Signed)
Pt reports NV x months; completed dialysis today; c/o epigastric pain

## 2023-05-07 ENCOUNTER — Ambulatory Visit (HOSPITAL_BASED_OUTPATIENT_CLINIC_OR_DEPARTMENT_OTHER)
Admission: RE | Admit: 2023-05-07 | Discharge: 2023-05-07 | Disposition: A | Discharge status: Home / Self Care | Payer: Medicare Other | Source: Ambulatory Visit | Attending: Physician Assistant | Admitting: Physician Assistant

## 2023-05-07 ENCOUNTER — Ambulatory Visit (HOSPITAL_COMMUNITY)
Admission: RE | Admit: 2023-05-07 | Discharge: 2023-05-07 | Disposition: A | Discharge status: Home / Self Care | Payer: Medicare Other | Source: Ambulatory Visit | Attending: Internal Medicine | Admitting: Internal Medicine

## 2023-05-07 ENCOUNTER — Encounter (HOSPITAL_COMMUNITY): Payer: Self-pay | Admitting: Internal Medicine

## 2023-05-07 VITALS — BP 138/100 | HR 95 | Wt 128.8 lb

## 2023-05-07 DIAGNOSIS — I5022 Chronic systolic (congestive) heart failure: Secondary | ICD-10-CM

## 2023-05-07 DIAGNOSIS — R112 Nausea with vomiting, unspecified: Secondary | ICD-10-CM | POA: Insufficient documentation

## 2023-05-07 DIAGNOSIS — I340 Nonrheumatic mitral (valve) insufficiency: Secondary | ICD-10-CM | POA: Insufficient documentation

## 2023-05-07 DIAGNOSIS — I10 Essential (primary) hypertension: Secondary | ICD-10-CM

## 2023-05-07 DIAGNOSIS — I361 Nonrheumatic tricuspid (valve) insufficiency: Secondary | ICD-10-CM | POA: Diagnosis not present

## 2023-05-07 DIAGNOSIS — E785 Hyperlipidemia, unspecified: Secondary | ICD-10-CM | POA: Insufficient documentation

## 2023-05-07 DIAGNOSIS — N186 End stage renal disease: Secondary | ICD-10-CM | POA: Diagnosis not present

## 2023-05-07 DIAGNOSIS — Z992 Dependence on renal dialysis: Secondary | ICD-10-CM | POA: Insufficient documentation

## 2023-05-07 DIAGNOSIS — I132 Hypertensive heart and chronic kidney disease with heart failure and with stage 5 chronic kidney disease, or end stage renal disease: Secondary | ICD-10-CM | POA: Diagnosis not present

## 2023-05-07 DIAGNOSIS — Z86718 Personal history of other venous thrombosis and embolism: Secondary | ICD-10-CM | POA: Diagnosis not present

## 2023-05-07 DIAGNOSIS — E1022 Type 1 diabetes mellitus with diabetic chronic kidney disease: Secondary | ICD-10-CM | POA: Insufficient documentation

## 2023-05-07 DIAGNOSIS — I5023 Acute on chronic systolic (congestive) heart failure: Secondary | ICD-10-CM | POA: Insufficient documentation

## 2023-05-07 DIAGNOSIS — Z7901 Long term (current) use of anticoagulants: Secondary | ICD-10-CM | POA: Diagnosis not present

## 2023-05-07 DIAGNOSIS — Z87891 Personal history of nicotine dependence: Secondary | ICD-10-CM | POA: Diagnosis not present

## 2023-05-07 DIAGNOSIS — O903 Peripartum cardiomyopathy: Secondary | ICD-10-CM | POA: Diagnosis present

## 2023-05-07 LAB — ECHOCARDIOGRAM COMPLETE
Area-P 1/2: 4.06 cm2
Calc EF: 21 %
MV M vel: 4.55 m/s
MV Peak grad: 82.8 mmHg
S' Lateral: 4.7 cm
Single Plane A2C EF: 22.7 %
Single Plane A4C EF: 20.1 %

## 2023-05-07 MED ORDER — MAGNESIUM OXIDE 400 MG PO TABS: 800.0000 mg | ORAL_TABLET | Freq: Every day | ORAL | 6 refills | Status: AC

## 2023-05-07 MED ORDER — ENTRESTO 24-26 MG PO TABS: 1.0000 | ORAL_TABLET | Freq: Two times a day (BID) | ORAL | 3 refills | Status: DC

## 2023-05-07 MED ORDER — CARVEDILOL 25 MG PO TABS: 25.0000 mg | ORAL_TABLET | Freq: Two times a day (BID) | ORAL | 6 refills | Status: DC

## 2023-05-07 MED ORDER — ATORVASTATIN CALCIUM 40 MG PO TABS: 40.0000 mg | ORAL_TABLET | Freq: Every evening | ORAL | 11 refills | Status: DC

## 2023-05-07 MED ORDER — HYDRALAZINE HCL 100 MG PO TABS: 100.0000 mg | ORAL_TABLET | Freq: Two times a day (BID) | ORAL | 6 refills | Status: DC

## 2023-05-07 NOTE — Progress Notes (Signed)
  Echocardiogram 2D Echocardiogram has been performed.  Milda Smart 05/07/2023, 2:36 PM

## 2023-05-07 NOTE — Progress Notes (Signed)
ADVANCED HF CLINIC NOTE  PCP: Loura Pardon, PA Primary Cardiologist: DB  HPI:  Kathy Lewis is a 27 y/o woman with DM1, ESRD on PD and presumed peripartum CM dating back to 2021 with severe LV dysfunction. Has been followed at [facility] AHF team but work-up complicated by lack of f/o and compliance issues.    Recently admitted to [facility] and underwent RHC with markedly elevated filling pressures and low output. Started on inotropes. Also underwent CVVHD for several days but refused conversion to PD. Left AMA.   Echo 3/23 EF 20% RV moderately reduced   Admitted 1/24 for HF decompensation. PD not working. Switched to HD.  I saw her in clinic 1/24 and referred to [facility] for heart/kidney/pancreas transplant eval. Cancelled appointment due to COVID  Admitted 10/04/22 with intractable n/v and HTN urgency. Head CT with small chronic appearing right superior cerebellar infarct. No acute changes. Developed hypoglycemia and persistent uncontrolled hypertension. Admitted to the ICU for clevidipine infusion. Oral meds adjusted  Echo 10/05/22 EF < 20% RV moderately reduced. Mild MR  Seen in ED 10/22/22 with COVID. Left AMA  Returned to ED 11/14/22 with abdominal pain. Given IV fluids, reglan, and dilaudid. She left ED.   (Had normal gastric emptying study in 6/23)  Was seen at [facility] for heart-kidney transplant evaluation. CPX 4/24. pVO2 10.7 (34%) VE/VCO2 36 RER 0.92. Has been deferred for now. Until she can prove better compliance.  Admitted 6/24 with N/V.    Today she returns for HF follow up. Feeling Ok. Doing HD 3x/week. BP remains high - particularly DBP. Has lost 20 pounds with HD. Breathing ok. No edema, orthopnea or PND. Tolerating current meds ok. Having migraines with Imdur. Says n/v much improved with new meds for anxiety.    Echo today 05/07/23: EF 20-25%    ROS: All systems negative except as listed in HPI, PMH and Problem List.  SH:  Social History   Socioeconomic History    Marital status: Single    Spouse name: Not on file   Number of children: 1   Years of education: Not on file   Highest education level: Not on file  Occupational History   Occupation: disabled  Tobacco Use   Smoking status: Former    Current packs/day: 1.00    Average packs/day: 1 pack/day for 2.0 years (2.0 ttl pk-yrs)    Types: Cigars, Cigarettes   Smokeless tobacco: Never  Vaping Use   Vaping status: Never Used  Substance and Sexual Activity   Alcohol use: No   Drug use: No   Sexual activity: Yes    Birth control/protection: None  Other Topics Concern   Not on file  Social History Narrative   Not on file   Social Determinants of Health   Financial Resource Strain: Low Risk  (04/19/2023)   Received from [facility]   Overall Financial Resource Strain (CARDIA)    Difficulty of Paying Living Expenses: Not hard at all  Food Insecurity: No Food Insecurity (04/19/2023)   [facility]   Hunger Vital Sign    Worried About Running Out of Food in the Last Year: Never true    Ran Out of Food in the Last Year: Never true  Transportation Needs: No Transportation Needs (04/19/2023)   Received from [facility]    Lack of Transportation (Medical): No    Lack of Transportation (Non-Medical): No  Recent Concern: Transportation Needs - Unmet Transportation Needs (04/14/2023)  Received from [employer]    In the past 12 months, has lack of reliable transportation kept you from medical appointments, meetings, work or from getting things needed for daily living? : Yes  Physical Activity: Unknown (04/19/2023)   Received from [facility]   Exercise Vital Sign    Days of Exercise per Week: 0 days    Minutes of Exercise per Session: Not on file  Stress: No Stress Concern Present (04/19/2023)   Received from [facility] - Occupational Stress Questionnaire    Feeling of Stress : Not  at all  Social Connections: Moderately Integrated (04/19/2023)   Received from [facility]   Social Network    How would you rate your social network (family, work, friends)?: Adequate participation with social networks  Intimate Partner Violence: Not At Risk (04/19/2023)   Received from [facility]   HITS    Over the last 12 months how often did your partner physically hurt you?: 1    Over the last 12 months how often did your partner insult you or talk down to you?: 1    Over the last 12 months how often did your partner threaten you with physical harm?: 1    Over the last 12 months how often did your partner scream or curse at you?: 1    FH:  Family History  Adopted: Yes  Problem Relation Age of Onset   Heart disease Neg Hx     Past Medical History:  Diagnosis Date   Anemia    Anxiety    Bipolar 2 disorder (HCC)    Chronic kidney disease    Chronic systolic (congestive) heart failure (HCC)    Depression    DKA (diabetic ketoacidoses)    ESRD on peritoneal dialysis (HCC)    HSV infection    on valtrex   Hypokalemia    Leukocytosis    Migraine    Noncompliance with medication regimen    Preeclampsia    Prolonged QT syndrome    Severe anemia    Type 1 diabetes mellitus (HCC)     Current Outpatient Medications  Medication Sig Dispense Refill   acetaminophen (TYLENOL) 500 MG tablet Take 1,000 mg by mouth as needed for headache (pain).     amitriptyline (ELAVIL) 50 MG tablet Take 1 tablet (50 mg total) by mouth at bedtime. 30 tablet 3   atorvastatin (LIPITOR) 40 MG tablet Take 40 mg by mouth every evening.     B Complex-C-Folic Acid (RENA-VITE RX) 1 MG TABS Take 1 tablet by mouth daily.     calcium acetate (PHOSLO) 667 MG capsule Take 1,334 mg by mouth 3 (three) times daily with meals.     carvedilol (COREG) 25 MG tablet Take 25 mg by mouth 2 (two) times daily with a meal.     clonazePAM (KLONOPIN) 0.5 MG tablet Take 1 tablet (0.5 mg total) by mouth 2 (two) times  daily as needed for anxiety. 20 tablet 0   ELIQUIS 2.5 MG TABS tablet Take 2.5 mg by mouth 2 (two) times daily.     hydrALAZINE (APRESOLINE) 100 MG tablet Take 100 mg by mouth 2 (two) times daily.     hydrOXYzine (ATARAX) 10 MG tablet Take 10 mg by mouth at bedtime.     insulin lispro (HUMALOG) 100 UNIT/ML injection Inject 0.5 Units into the skin See admin instructions. 0.5 units per hour via insulin pump.  isosorbide mononitrate (IMDUR) 30 MG 24 hr tablet Take 1 tablet (30 mg total) by mouth daily. 30 tablet 0   magnesium oxide (MAG-OX) 400 MG tablet Take 800 mg by mouth daily.     Vitamin D, Ergocalciferol, (DRISDOL) 1.25 MG (50000 UNIT) CAPS capsule Take 50,000 Units by mouth every Sunday.     No current facility-administered medications for this encounter.    Vitals:   05/07/23 1455  BP: (!) 138/100  Pulse: 95  SpO2: 96%  Weight: 58.4 kg (128 lb 12.8 oz)    Wt Readings from Last 3 Encounters:  05/07/23 58.4 kg (128 lb 12.8 oz)  03/03/23 67.2 kg (148 lb 2.4 oz)  02/20/23 67.2 kg (148 lb 2.4 oz)    PHYSICAL EXAM: General:  Sitting in chair. No resp difficulty HEENT: normal Neck: supple. no JVD. + perm-cath Carotids 2+ bilat; no bruits. No lymphadenopathy or thryomegaly appreciated. Cor: PMI nondisplaced. Regular rate & rhythm. No rubs, gallops or murmurs. Lungs: clear Abdomen: soft, nontender, nondistended. No hepatosplenomegaly. No bruits or masses. Good bowel sounds. Extremities: no cyanosis, clubbing, rash, edema Neuro: alert & orientedx3, cranial nerves grossly intact. moves all 4 extremities w/o difficulty. Affect pleasant  ASSESSMENT & PLAN:  1. 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 RHC 07/29/20:  CO low-normal by TD (CO/CI) Thermodilution: 4.07/2.59) and normal cardiac output by Fick (CO/CI Fick: 4.7/3.0) ECHOs 21' suggested evidence of possible non-compaction, but cMRI 83' showed no evidence of infarction, myocarditis, or infiltrative  patterns. TTE on 07/30/20 with LVEF 35-40% but declined to 25-30% around 04/2021 likely in the setting of noncompliance with medical therapies.  - Echo 10/2021: EF of 20%. Small pericardial effusion. Indeterminate diastolic function   - Echo 08/14/22: EF 13%, mod MR, severe TR, PASP ~50, small effusion - RHC at [facility] 12/23: RA mean, PA mean 56, PCWP 39, PA sat 56%, Fick CI 1.7, Thermo CI 1.9 - Echo 10/05/22 EF < 20% RV moderately reduced. Mild MR - CPX 4/24. pVO2 10.7 (34%) VE/VCO2 36 RER 0.92. - Echo today 05/07/23: EF 20-25%  - Previously followed by AHF team at [facility] but has switched care here - Admitted with volume overload 1/24. PD wasn't working. Transitioned to HD  - Improved NYHA II-III Volume status  much improved  - Now tolerating carvedilol 25 bid and hydral 100 bid/imdur 30 daily - BP still up. Add Entresto 24/26 bid - Has been seen at [facility] for heart-kidney-pancreas transplant in 4/24. She has been turned down for compliance issues (signing out AMA). We discussed this in detail and discussed need to show complete compliance with meds, HD and office visits as well as tight control of DM. If she can maintain this we will refer back for re-evaluation. She is doing so much better now that she has family support for caring for her daughter.   2. End stage renal disease on PD - Volume status much improved on HD  - Continue HD. Followed in [place]   3. DMI - per primary - Hgb A1c 8.6-> 7.5   4. HTN - BP high - Add Entresto 24/26 bid  5. N/V - suspect DM gastroparesis but emyptying study ok in 6/23 - RHC did not support low output state - now resolved with better control of anxiety  6. RIJ DVT - on Eliquis 2.5 bid - no bleeding   Arvilla Meres, MD  3:05 PM

## 2023-05-07 NOTE — Patient Instructions (Signed)
Great to see you today!!!  Your physician recommends that you schedule a follow-up appointment in: 2 months  If you have any questions or concerns before your next appointment please send [place] a message through [place] or call our office at [phone].    TO LEAVE A MESSAGE FOR THE NURSE SELECT OPTION 2, PLEASE LEAVE A MESSAGE INCLUDING: YOUR NAME DATE OF BIRTH CALL BACK NUMBER REASON FOR CALL**this is important as we prioritize the call backs  YOU WILL RECEIVE A CALL BACK THE SAME DAY AS LONG AS YOU CALL BEFORE 4:00 PM  At the [facility], you and your health needs are our priority. As part of our continuing mission to provide you with exceptional heart care, we have created designated Provider Care Teams. These Care Teams include your primary Cardiologist (physician) and Advanced Practice Providers (APPs- Physician Assistants and Nurse Practitioners) who all work together to provide you with the care you need, when you need it.   You may see any of the following providers on your designated Care Team at your next follow up: Dr Arvilla Meres Dr Marca Ancona Dr. Marcos Eke, NP Robbie Lis, [place] [facility], [place] Brynda Peon, NP Karle Plumber, PharmD   Please be sure to bring in all your medications bottles to every appointment.    Thank you for choosing [facility]

## 2023-05-17 ENCOUNTER — Emergency Department (HOSPITAL_COMMUNITY)
Admission: EM | Admit: 2023-05-17 | Discharge: 2023-05-17 | Disposition: A | Discharge status: Home / Self Care | Payer: Medicare Other | Attending: Emergency Medicine | Admitting: Emergency Medicine

## 2023-05-17 ENCOUNTER — Emergency Department (HOSPITAL_COMMUNITY): Payer: Medicare Other

## 2023-05-17 ENCOUNTER — Encounter (HOSPITAL_COMMUNITY): Payer: Self-pay | Admitting: Emergency Medicine

## 2023-05-17 DIAGNOSIS — Z992 Dependence on renal dialysis: Secondary | ICD-10-CM | POA: Insufficient documentation

## 2023-05-17 DIAGNOSIS — I1A0 Resistant hypertension: Secondary | ICD-10-CM

## 2023-05-17 DIAGNOSIS — N186 End stage renal disease: Secondary | ICD-10-CM | POA: Diagnosis not present

## 2023-05-17 DIAGNOSIS — I1311 Hypertensive heart and chronic kidney disease without heart failure, with stage 5 chronic kidney disease, or end stage renal disease: Secondary | ICD-10-CM | POA: Insufficient documentation

## 2023-05-17 DIAGNOSIS — R7989 Other specified abnormal findings of blood chemistry: Secondary | ICD-10-CM | POA: Insufficient documentation

## 2023-05-17 DIAGNOSIS — I120 Hypertensive chronic kidney disease with stage 5 chronic kidney disease or end stage renal disease: Secondary | ICD-10-CM | POA: Insufficient documentation

## 2023-05-17 DIAGNOSIS — Z79899 Other long term (current) drug therapy: Secondary | ICD-10-CM | POA: Diagnosis not present

## 2023-05-17 DIAGNOSIS — Z794 Long term (current) use of insulin: Secondary | ICD-10-CM | POA: Diagnosis not present

## 2023-05-17 DIAGNOSIS — R1010 Upper abdominal pain, unspecified: Secondary | ICD-10-CM

## 2023-05-17 DIAGNOSIS — E1122 Type 2 diabetes mellitus with diabetic chronic kidney disease: Secondary | ICD-10-CM | POA: Diagnosis not present

## 2023-05-17 DIAGNOSIS — R1013 Epigastric pain: Secondary | ICD-10-CM | POA: Diagnosis present

## 2023-05-17 DIAGNOSIS — I429 Cardiomyopathy, unspecified: Secondary | ICD-10-CM | POA: Insufficient documentation

## 2023-05-17 DIAGNOSIS — Z7901 Long term (current) use of anticoagulants: Secondary | ICD-10-CM | POA: Insufficient documentation

## 2023-05-17 DIAGNOSIS — R112 Nausea with vomiting, unspecified: Secondary | ICD-10-CM

## 2023-05-17 LAB — BRAIN NATRIURETIC PEPTIDE: B Natriuretic Peptide: 2673.9 pg/mL — ABNORMAL HIGH (ref 0.0–100.0)

## 2023-05-17 LAB — I-STAT VENOUS BLOOD GAS, ED
Acid-Base Excess: 2 mmol/L (ref 0.0–2.0)
Bicarbonate: 24.4 mmol/L (ref 20.0–28.0)
Calcium, Ion: 1.09 mmol/L — ABNORMAL LOW (ref 1.15–1.40)
HCT: 43 % (ref 36.0–46.0)
Hemoglobin: 14.6 g/dL (ref 12.0–15.0)
O2 Saturation: 100 %
Potassium: 4.8 mmol/L (ref 3.5–5.1)
Sodium: 136 mmol/L (ref 135–145)
TCO2: 25 mmol/L (ref 22–32)
pCO2, Ven: 31.6 mmHg — ABNORMAL LOW (ref 44–60)
pH, Ven: 7.495 — ABNORMAL HIGH (ref 7.25–7.43)
pO2, Ven: 202 mmHg — ABNORMAL HIGH (ref 32–45)

## 2023-05-17 LAB — CBC WITH DIFFERENTIAL/PLATELET
Abs Immature Granulocytes: 0.01 10*3/uL (ref 0.00–0.07)
Basophils Absolute: 0.1 10*3/uL (ref 0.0–0.1)
Basophils Relative: 2 %
Eosinophils Absolute: 0 10*3/uL (ref 0.0–0.5)
Eosinophils Relative: 1 %
HCT: 40.3 % (ref 36.0–46.0)
Hemoglobin: 12.7 g/dL (ref 12.0–15.0)
Immature Granulocytes: 0 %
Lymphocytes Relative: 25 %
Lymphs Abs: 1 10*3/uL (ref 0.7–4.0)
MCH: 31.8 pg (ref 26.0–34.0)
MCHC: 31.5 g/dL (ref 30.0–36.0)
MCV: 100.8 fL — ABNORMAL HIGH (ref 80.0–100.0)
Monocytes Absolute: 0.3 10*3/uL (ref 0.1–1.0)
Monocytes Relative: 7 %
Neutro Abs: 2.7 10*3/uL (ref 1.7–7.7)
Neutrophils Relative %: 65 %
Platelets: 199 10*3/uL (ref 150–400)
RBC: 4 MIL/uL (ref 3.87–5.11)
RDW: 16.7 % — ABNORMAL HIGH (ref 11.5–15.5)
WBC: 4.1 10*3/uL (ref 4.0–10.5)
nRBC: 0 % (ref 0.0–0.2)

## 2023-05-17 LAB — COMPREHENSIVE METABOLIC PANEL
ALT: 27 U/L (ref 0–44)
AST: 31 U/L (ref 15–41)
Albumin: 3.7 g/dL (ref 3.5–5.0)
Alkaline Phosphatase: 106 U/L (ref 38–126)
Anion gap: 16 — ABNORMAL HIGH (ref 5–15)
BUN: 23 mg/dL — ABNORMAL HIGH (ref 6–20)
CO2: 20 mmol/L — ABNORMAL LOW (ref 22–32)
Calcium: 9.9 mg/dL (ref 8.9–10.3)
Chloride: 101 mmol/L (ref 98–111)
Creatinine, Ser: 3.95 mg/dL — ABNORMAL HIGH (ref 0.44–1.00)
GFR, Estimated: 15 mL/min — ABNORMAL LOW (ref >60)
Glucose, Bld: 88 mg/dL (ref 70–99)
Potassium: 4.8 mmol/L (ref 3.5–5.1)
Sodium: 137 mmol/L (ref 135–145)
Total Bilirubin: 1.2 mg/dL (ref 0.3–1.2)
Total Protein: 7.7 g/dL (ref 6.5–8.1)

## 2023-05-17 LAB — HCG, SERUM, QUALITATIVE: Preg, Serum: NEGATIVE

## 2023-05-17 LAB — I-STAT CHEM 8, ED
BUN: 26 mg/dL — ABNORMAL HIGH (ref 6–20)
Calcium, Ion: 1.13 mmol/L — ABNORMAL LOW (ref 1.15–1.40)
Chloride: 104 mmol/L (ref 98–111)
Creatinine, Ser: 4.2 mg/dL — ABNORMAL HIGH (ref 0.44–1.00)
Glucose, Bld: 90 mg/dL (ref 70–99)
HCT: 44 % (ref 36.0–46.0)
Hemoglobin: 15 g/dL (ref 12.0–15.0)
Potassium: 4.8 mmol/L (ref 3.5–5.1)
Sodium: 137 mmol/L (ref 135–145)
TCO2: 24 mmol/L (ref 22–32)

## 2023-05-17 LAB — TROPONIN I (HIGH SENSITIVITY)
Troponin I (High Sensitivity): 284 ng/L (ref <18)
Troponin I (High Sensitivity): 292 ng/L (ref <18)

## 2023-05-17 LAB — LIPASE, BLOOD: Lipase: 18 U/L (ref 11–51)

## 2023-05-17 MED ORDER — PANTOPRAZOLE SODIUM 40 MG IV SOLR
40.0000 mg | Freq: Once | INTRAVENOUS | Status: AC
  Administered 2023-05-17: 40 mg via INTRAVENOUS
  Filled 2023-05-17: qty 10

## 2023-05-17 MED ORDER — HYDROMORPHONE HCL 1 MG/ML IJ SOLN
1.0000 mg | Freq: Once | INTRAMUSCULAR | Status: AC
  Administered 2023-05-17: 1 mg via INTRAVENOUS
  Filled 2023-05-17: qty 1

## 2023-05-17 MED ORDER — METOCLOPRAMIDE HCL 5 MG/ML IJ SOLN
10.0000 mg | Freq: Once | INTRAMUSCULAR | Status: AC
  Administered 2023-05-17: 10 mg via INTRAVENOUS
  Filled 2023-05-17: qty 2

## 2023-05-17 MED ORDER — LACTATED RINGERS IV SOLN: INTRAVENOUS | Status: DC

## 2023-05-17 NOTE — ED Triage Notes (Signed)
Pt arrives via EMS with abd pain that started after dialysis today. EMS gave 4 mg zofran with no relief. Pt hypertensive.

## 2023-05-17 NOTE — Discharge Instructions (Signed)
You were seen in the emergency department for recurrent abdominal pain nausea and vomiting.  You received some pain medication and nausea medication with improvement in your symptoms.  Your blood pressure and troponins were elevated.  Please continue your regular medications and follow-up with the heart failure clinic.  Return to the emergency department if any worsening or concerning symptoms

## 2023-05-17 NOTE — ED Notes (Signed)
While RN and NT were cleaning the pt a room, pt fell out of wheelchair into triage room. Pt placed into stretcher, placed on cardiac monitor and EDP made aware.

## 2023-05-17 NOTE — ED Provider Notes (Signed)
[place] [facility]GENC[facility] DEPARTMENT AT [facility] Provider Note   CSN: [id number] Arrival date & time: 05/17/23  1550     History  Chief Complaint  Patient presents with   Abdominal Pain   Emesis    Kathy Lewis is a 27 y.o. female.  She has a history of end-stage renal disease, dialysis Tuesday Thursday Saturday and had dialysis today.  She has a history of diabetes DKA gastroparesis chronic abdominal pain hypertensive urgency.  She said she started with upper abdominal pain nausea and vomiting this morning.  Was able to complete dialysis and went home but her symptoms worsened so she called the ambulance.  She said she has had this before.  She has no idea why it flared up again.  No fevers or chills no chest pain or shortness of breath.  She said she makes a little bit of urine.  Of note in the chart she was admitted to [facility] about a month ago for nausea vomiting hypocalcemia  The history is provided by the patient.  Abdominal Pain Pain location:  Epigastric Pain quality: aching   Pain severity:  Severe Onset quality:  Gradual Duration:  1 day Timing:  Constant Progression:  Worsening Chronicity:  Recurrent Relieved by:  Nothing Worsened by:  Nothing Ineffective treatments:  None tried Associated symptoms: nausea and vomiting   Associated symptoms: no chest pain, no fever and no shortness of breath        Home Medications Prior to Admission medications   Medication Sig Start Date End Date Taking? Authorizing Provider  acetaminophen (TYLENOL) 500 MG tablet Take 1,000 mg by mouth as needed for headache (pain).    [provider]  amitriptyline (ELAVIL) 50 MG tablet Take 1 tablet (50 mg total) by mouth at bedtime. 08/30/22   Shanna Cisco, NP  atorvastatin (LIPITOR) 40 MG tablet Take 1 tablet (40 mg total) by mouth every evening. 05/07/23   Bensimhon, Bevelyn Buckles, MD  B Complex-C-Folic Acid (RENA-VITE RX) 1 MG TABS Take 1 tablet by mouth daily.  01/01/23   [provider]  calcium acetate (PHOSLO) 667 MG capsule Take 1,334 mg by mouth 3 (three) times daily with meals. 09/28/22   [provider]  carvedilol (COREG) 25 MG tablet Take 1 tablet (25 mg total) by mouth 2 (two) times daily with a meal. 05/07/23   Bensimhon, Bevelyn Buckles, MD  clonazePAM (KLONOPIN) 0.5 MG tablet Take 1 tablet (0.5 mg total) by mouth 2 (two) times daily as needed for anxiety. 03/16/22   Leroy Sea, MD  ELIQUIS 2.5 MG TABS tablet Take 2.5 mg by mouth 2 (two) times daily.    [provider]  hydrALAZINE (APRESOLINE) 100 MG tablet Take 1 tablet (100 mg total) by mouth 2 (two) times daily. 05/07/23   Bensimhon, Bevelyn Buckles, MD  hydrOXYzine (ATARAX) 10 MG tablet Take 10 mg by mouth at bedtime.    [provider]  insulin lispro (HUMALOG) 100 UNIT/ML injection Inject 0.5 Units into the skin See admin instructions. 0.5 units per hour via insulin pump. 01/08/22   [provider]  isosorbide mononitrate (IMDUR) 30 MG 24 hr tablet Take 1 tablet (30 mg total) by mouth daily. 10/09/22 11/20/23  Arrien, York Ram, MD  magnesium oxide (MAG-OX) 400 MG tablet Take 2 tablets (800 mg total) by mouth daily. 05/07/23   Bensimhon, Bevelyn Buckles, MD  sacubitril-valsartan (ENTRESTO) 24-26 MG Take 1 tablet by mouth 2 (two) times daily. 05/07/23  Bensimhon, Bevelyn Buckles, MD  Vitamin D, Ergocalciferol, (DRISDOL) 1.25 MG (50000 UNIT) CAPS capsule Take 50,000 Units by mouth every Sunday.    [provider]  escitalopram (LEXAPRO) 10 MG tablet Take 20 mg by mouth daily.  05/10/20 10/06/20  [provider]  furosemide (LASIX) 40 MG tablet Take 1 tablet (40 mg total) by mouth daily. Patient not taking: Reported on 02/16/2021 10/28/19 02/17/21  Geryl Rankins, MD  lisinopril (ZESTRIL) 10 MG tablet Take 10 mg by mouth daily.  02/23/20 10/06/20  [provider]  spironolactone (ALDACTONE) 25 MG tablet Take 25 mg by mouth daily.  04/20/20 10/06/20  [provider]      Allergies    Cantaloupe extract allergy skin test, Citrullus vulgaris, Food, Nsaids, and Strawberry extract    Review of Systems   Review of Systems  Constitutional:  Negative for fever.  Respiratory:  Negative for shortness of breath.   Cardiovascular:  Negative for chest pain.  Gastrointestinal:  Positive for abdominal pain, nausea and vomiting.    Physical Exam Updated Vital Signs BP (!) 184/139   Pulse 98   Temp 98.4 F (36.9 C) (Oral)   Resp 18   Ht 5' (1.524 m)   Wt 58 kg   SpO2 100%   BMI 24.97 kg/m  Physical Exam Vitals and nursing note reviewed.  Constitutional:      General: She is not in acute distress.    Appearance: She is well-developed.  HENT:     Head: Normocephalic and atraumatic.  Eyes:     Conjunctiva/sclera: Conjunctivae normal.  Cardiovascular:     Rate and Rhythm: Normal rate and regular rhythm.     Heart sounds: No murmur heard.    Comments: She has an AV catheter in her right upper chest without surrounding erythema Pulmonary:     Effort: Pulmonary effort is normal. No respiratory distress.     Breath sounds: Normal breath sounds.  Abdominal:     Palpations: Abdomen is soft.     Tenderness: There is abdominal tenderness. There is no guarding or rebound.  Musculoskeletal:        General: No deformity.     Cervical back: Neck supple.  Skin:    General: Skin is warm and dry.     Capillary Refill: Capillary refill takes less than 2 seconds.  Neurological:     General: No focal deficit present.     Mental Status: She is alert.     ED Results / Procedures / Treatments   Labs (all labs ordered are listed, but only abnormal results are displayed) Labs Reviewed  COMPREHENSIVE METABOLIC PANEL - Abnormal; Notable for the following components:      Result Value   CO2 20 (*)    BUN 23 (*)    Creatinine, Ser 3.95 (*)    GFR, Estimated 15 (*)    Anion gap 16 (*)    All other components within normal limits  CBC WITH  DIFFERENTIAL/PLATELET - Abnormal; Notable for the following components:   MCV 100.8 (*)    RDW 16.7 (*)    All other components within normal limits  BRAIN NATRIURETIC PEPTIDE - Abnormal; Notable for the following components:   B Natriuretic Peptide 2,673.9 (*)    All other components within normal limits  I-STAT CHEM 8, ED - Abnormal; Notable for the following components:   BUN 26 (*)    Creatinine, Ser 4.20 (*)    Calcium, Ion 1.13 (*)  All other components within normal limits  I-STAT VENOUS BLOOD GAS, ED - Abnormal; Notable for the following components:   pH, Ven 7.495 (*)    pCO2, Ven 31.6 (*)    pO2, Ven 202 (*)    Calcium, Ion 1.09 (*)    All other components within normal limits  TROPONIN I (HIGH SENSITIVITY) - Abnormal; Notable for the following components:   Troponin I (High Sensitivity) 284 (*)    All other components within normal limits  TROPONIN I (HIGH SENSITIVITY) - Abnormal; Notable for the following components:   Troponin I (High Sensitivity) 292 (*)    All other components within normal limits  LIPASE, BLOOD  HCG, SERUM, QUALITATIVE    EKG EKG Interpretation Date/Time:  Thursday May 17 2023 16:05:08 EDT Ventricular Rate:  97 PR Interval:  141 QRS Duration:  104 QT Interval:  366 QTC Calculation: 465 R Axis:   -39  Text Interpretation: Sinus rhythm Left atrial enlargement Left axis deviation Nonspecific T abnormalities, lateral leads No significant change since prior 7/24 Confirmed by Meridee Score [phone]) on 05/17/2023 4:08:53 PM  Radiology DG Chest Port 1 View  Result Date: 05/17/2023 CLINICAL DATA:  History of abdominal pain EXAM: PORTABLE CHEST 1 VIEW COMPARISON:  March 11, 2023 FINDINGS: The cardiomediastinal silhouette is unchanged and massively enlarged in contour.RIGHT chest CVC with tip terminating over the RIGHT atrium. No pleural effusion. No pneumothorax. Perihilar vascular fullness without overt edema. LEFT basilar atelectasis. No  acute pleuroparenchymal abnormality. Visualized abdomen is unremarkable. IMPRESSION: Cardiomegaly with vascular congestion without overt edema. Electronically Signed   By: Meda Klinefelter M.D.   On: 05/17/2023 17:20    Procedures Procedures    Medications Ordered in ED Medications  HYDROmorphone (DILAUDID) injection 1 mg (1 mg Intravenous Given 05/17/23 1638)  metoCLOPramide (REGLAN) injection 10 mg (10 mg Intravenous Given 05/17/23 1639)  pantoprazole (PROTONIX) injection 40 mg (40 mg Intravenous Given 05/17/23 1639)    ED Course/ Medical Decision Making/ A&P Clinical Course as of 05/18/23 1029  Thu May 17, 2023  1701 Chest x-ray showing cardiomegaly and vascular catheter no gross infiltrate no free air.  Awaiting radiology reading. [MB]  1757 Troponin elevated at 284.  Baseline troponins usually look like they are in the 20s.  Last CAT scan was about a month ago showing moderate ascites unchanged from prior. [MB]  1956 She saw Dr. Gala Romney little over a week ago.  Has chronically poor ejection fraction and elevated blood pressures. [MB]  2049 Discussed with cardiology fellow Dr. Noemi Chapel.  She felt that the elevated troponin was not reflective of ischemia but was probably related to her end-stage renal disease and her hypertension.  She thought she was otherwise safe for discharge from a cardiology standpoint. [MB]    Clinical Course User Index [MB] Terrilee Files, MD                                 Medical Decision Making Amount and/or Complexity of Data Reviewed Labs: ordered. Radiology: ordered.  Risk Prescription drug management.   This patient complains of nausea vomiting pain in her abdomen; this involves an extensive number of treatment Options and is a complaint that carries with it a high risk of complications and morbidity. The differential includes gastroparesis, gastritis, obstruction, DKA, ACS  I ordered, reviewed and interpreted labs, which included CBC  unremarkable chemistries with chronic CKD troponins elevated but not significantly rising  BNP chronically elevated VBG without acidosis pregnancy test negative I ordered medication IV pain medication nausea medication PPI with improvement in her symptoms and reviewed PMP when indicated. I ordered imaging studies which included chest x-ray and I independently    visualized and interpreted imaging which showed cardiomegaly Additional history obtained from EMS Previous records obtained and reviewed in epic including prior ED visits and recent admission to [facility], recent cardiology notes at heart failure clinic I consulted cardiology Dr. Florina Ou and discussed lab and imaging findings and discussed disposition.  Cardiac monitoring reviewed, sinus rhythm Social determinants considered, patient with depression Critical Interventions: None  After the interventions stated above, I reevaluated the patient and found patient's symptoms to be much improved Admission and further testing considered, she is asking to be discharged and I think this is reasonable at this time.  Recommended close follow-up with her PCP and her heart failure team.  Return instructions discussed         Final Clinical Impression(s) / ED Diagnoses Final diagnoses:  Upper abdominal pain  Nausea and vomiting, unspecified vomiting type  Resistant hypertension  Elevated troponin  End stage renal disease (HCC)    Rx / DC Orders ED Discharge Orders     None         Terrilee Files, MD 05/18/23 1032

## 2023-05-22 ENCOUNTER — Encounter (HOSPITAL_BASED_OUTPATIENT_CLINIC_OR_DEPARTMENT_OTHER): Payer: Self-pay | Admitting: Pediatrics

## 2023-05-22 ENCOUNTER — Emergency Department (HOSPITAL_BASED_OUTPATIENT_CLINIC_OR_DEPARTMENT_OTHER)
Admission: EM | Admit: 2023-05-22 | Discharge: 2023-05-22 | Disposition: A | Discharge status: Home / Self Care | Payer: Medicare Other | Attending: Emergency Medicine | Admitting: Emergency Medicine

## 2023-05-22 ENCOUNTER — Other Ambulatory Visit: Payer: Self-pay

## 2023-05-22 DIAGNOSIS — R1013 Epigastric pain: Secondary | ICD-10-CM | POA: Insufficient documentation

## 2023-05-22 DIAGNOSIS — N186 End stage renal disease: Secondary | ICD-10-CM | POA: Diagnosis not present

## 2023-05-22 DIAGNOSIS — E1022 Type 1 diabetes mellitus with diabetic chronic kidney disease: Secondary | ICD-10-CM | POA: Diagnosis not present

## 2023-05-22 DIAGNOSIS — Z794 Long term (current) use of insulin: Secondary | ICD-10-CM | POA: Diagnosis not present

## 2023-05-22 DIAGNOSIS — R112 Nausea with vomiting, unspecified: Secondary | ICD-10-CM | POA: Insufficient documentation

## 2023-05-22 DIAGNOSIS — Z7901 Long term (current) use of anticoagulants: Secondary | ICD-10-CM | POA: Insufficient documentation

## 2023-05-22 DIAGNOSIS — Z992 Dependence on renal dialysis: Secondary | ICD-10-CM | POA: Insufficient documentation

## 2023-05-22 LAB — CBG MONITORING, ED: Glucose-Capillary: 190 mg/dL — ABNORMAL HIGH (ref 70–99)

## 2023-05-22 MED ORDER — DIPHENHYDRAMINE HCL 50 MG/ML IJ SOLN
25.0000 mg | Freq: Once | INTRAMUSCULAR | Status: AC
  Administered 2023-05-22: 25 mg via INTRAVENOUS
  Filled 2023-05-22: qty 1

## 2023-05-22 MED ORDER — METOCLOPRAMIDE HCL 5 MG/ML IJ SOLN
10.0000 mg | Freq: Once | INTRAMUSCULAR | Status: AC
  Administered 2023-05-22: 10 mg via INTRAVENOUS
  Filled 2023-05-22: qty 2

## 2023-05-22 MED ORDER — MORPHINE SULFATE (PF) 4 MG/ML IV SOLN
4.0000 mg | Freq: Once | INTRAVENOUS | Status: AC
  Administered 2023-05-22: 4 mg via INTRAVENOUS
  Filled 2023-05-22: qty 1

## 2023-05-22 NOTE — ED Notes (Signed)
ED Provider at bedside. 

## 2023-05-22 NOTE — Discharge Instructions (Signed)
You were seen in the emerged part for abdominal pain nausea and vomiting Your symptoms improved after cocktail of Reglan, Benadryl and morphine It is important that you attend dialysis assistance as scheduled Follow-up with your primary care doctor within 1 week for reevaluation Return to the emergency room for any severe pain, if you are unable to eat or drink or have any other concerns

## 2023-05-22 NOTE — ED Triage Notes (Signed)
Arrived in triage accompanied by family, who reports she had dialysis earlier today. Stated patient has been vomiting and not eating as much. Patient follow commands but providing min verbal response.

## 2023-05-22 NOTE — ED Provider Notes (Signed)
[place] EMERGENCY DEPARTMENT AT [facility] Provider Note   CSN: [id number] Arrival date & time: 05/22/23  1748     History  Chief Complaint  Patient presents with   Emesis    Kathy Lewis is a 27 y.o. female.  With a past medical history of ESRD on dialysis TTS, type 1 diabetes and chronic abdominal pain who presents to the ED for abdominal pain.  Patient reports 2 days of ongoing epigastric pain with nausea and vomiting.  Vomited multiple times yesterday nonbloody nonbilious.  No fevers chills chest pain or shortness of breath.  Attended dialysis as scheduled earlier this morning.   Emesis      Home Medications Prior to Admission medications   Medication Sig Start Date End Date Taking? Authorizing Provider  acetaminophen (TYLENOL) 500 MG tablet Take 1,000 mg by mouth as needed for headache (pain).    [provider]  amitriptyline (ELAVIL) 50 MG tablet Take 1 tablet (50 mg total) by mouth at bedtime. 08/30/22   Shanna Cisco, NP  atorvastatin (LIPITOR) 40 MG tablet Take 1 tablet (40 mg total) by mouth every evening. 05/07/23   Bensimhon, Bevelyn Buckles, MD  B Complex-C-Folic Acid (RENA-VITE RX) 1 MG TABS Take 1 tablet by mouth daily. 01/01/23   [provider]  calcium acetate (PHOSLO) 667 MG capsule Take 1,334 mg by mouth 3 (three) times daily with meals. 09/28/22   [provider]  carvedilol (COREG) 25 MG tablet Take 1 tablet (25 mg total) by mouth 2 (two) times daily with a meal. 05/07/23   Bensimhon, Bevelyn Buckles, MD  clonazePAM (KLONOPIN) 0.5 MG tablet Take 1 tablet (0.5 mg total) by mouth 2 (two) times daily as needed for anxiety. 03/16/22   Leroy Sea, MD  ELIQUIS 2.5 MG TABS tablet Take 2.5 mg by mouth 2 (two) times daily.    [provider]  hydrALAZINE (APRESOLINE) 100 MG tablet Take 1 tablet (100 mg total) by mouth 2 (two) times daily. 05/07/23   Bensimhon, Bevelyn Buckles, MD  hydrOXYzine (ATARAX) 10 MG tablet Take 10 mg by mouth at  bedtime.    [provider]  insulin lispro (HUMALOG) 100 UNIT/ML injection Inject 0.5 Units into the skin See admin instructions. 0.5 units per hour via insulin pump. 01/08/22   [provider]  isosorbide mononitrate (IMDUR) 30 MG 24 hr tablet Take 1 tablet (30 mg total) by mouth daily. 10/09/22 11/20/23  Arrien, York Ram, MD  magnesium oxide (MAG-OX) 400 MG tablet Take 2 tablets (800 mg total) by mouth daily. 05/07/23   Bensimhon, Bevelyn Buckles, MD  sacubitril-valsartan (ENTRESTO) 24-26 MG Take 1 tablet by mouth 2 (two) times daily. 05/07/23   Bensimhon, Bevelyn Buckles, MD  Vitamin D, Ergocalciferol, (DRISDOL) 1.25 MG (50000 UNIT) CAPS capsule Take 50,000 Units by mouth every Sunday.    [provider]  escitalopram (LEXAPRO) 10 MG tablet Take 20 mg by mouth daily.  05/10/20 10/06/20  [provider]  furosemide (LASIX) 40 MG tablet Take 1 tablet (40 mg total) by mouth daily. Patient not taking: Reported on 02/16/2021 10/28/19 02/17/21  Geryl Rankins, MD  lisinopril (ZESTRIL) 10 MG tablet Take 10 mg by mouth daily.  02/23/20 10/06/20  [provider]  spironolactone (ALDACTONE) 25 MG tablet Take 25 mg by mouth daily.  04/20/20 10/06/20  [provider]      Allergies    Cantaloupe extract allergy skin test, Citrullus vulgaris, Food, Nsaids, and Strawberry extract  Review of Systems   Review of Systems  Gastrointestinal:  Positive for vomiting.    Physical Exam Updated Vital Signs BP (!) 141/107   Pulse 76   Temp 98 F (36.7 C) (Oral)   Resp 20   Ht 5' (1.524 m)   Wt 58 kg   SpO2 100%   BMI 24.97 kg/m  Physical Exam Vitals and nursing note reviewed.  HENT:     Head: Normocephalic and atraumatic.  Eyes:     Pupils: Pupils are equal, round, and reactive to light.  Cardiovascular:     Rate and Rhythm: Normal rate and regular rhythm.     Comments: Dialysis catheter right chest wall without surrounding erythema or induration Pulmonary:      Effort: Pulmonary effort is normal.     Breath sounds: Normal breath sounds.  Abdominal:     Palpations: Abdomen is soft.     Tenderness: There is no abdominal tenderness.  Skin:    General: Skin is warm and dry.  Neurological:     Mental Status: She is alert.  Psychiatric:        Mood and Affect: Mood normal.     ED Results / Procedures / Treatments   Labs (all labs ordered are listed, but only abnormal results are displayed) Labs Reviewed  CBG MONITORING, ED - Abnormal; Notable for the following components:      Result Value   Glucose-Capillary 190 (*)    All other components within normal limits  COMPREHENSIVE METABOLIC PANEL  LIPASE, BLOOD  CBC WITH DIFFERENTIAL/PLATELET  HCG, SERUM, QUALITATIVE    EKG None  Radiology No results found.  Procedures Procedures    Medications Ordered in ED Medications  metoCLOPramide (REGLAN) injection 10 mg (10 mg Intravenous Given 05/22/23 2104)  morphine (PF) 4 MG/ML injection 4 mg (4 mg Intravenous Given 05/22/23 2103)  diphenhydrAMINE (BENADRYL) injection 25 mg (25 mg Intravenous Given 05/22/23 2104)    ED Course/ Medical Decision Making/ A&P Clinical Course as of 05/22/23 2241  Tue May 22, 2023  2239 Patient reports improvement in abdominal pain and nausea after eating medications given here Requesting discharge at this time prior to laboratory workup resulting.  Stable for discharge.  She will follow-up with her renal team as scheduled on Thursday her primary care doctor [MP]    Clinical Course User Index [MP] Royanne Foots, DO                                 Medical Decision Making 27 year old female with history as above presenting for abdominal pain.  Recurrent bouts of abdominal pain for which she has been evaluated by GI specialist with no clear etiology.  Some epigastric tenderness on my exam.  Feels similar to prior episode which resolves with Reglan, and opioid analgesics.  She did report pruritus from the  opiate analgesics last time so we will give 25 mg Benadryl.  Will obtain laboratory workup including CMP CBC and lipase  Amount and/or Complexity of Data Reviewed Labs: ordered.  Risk Prescription drug management.           Final Clinical Impression(s) / ED Diagnoses Final diagnoses:  Nausea and vomiting, unspecified vomiting type  Epigastric pain    Rx / DC Orders ED Discharge Orders     None         Royanne Foots, DO 05/22/23 2241

## 2023-05-26 ENCOUNTER — Emergency Department (HOSPITAL_BASED_OUTPATIENT_CLINIC_OR_DEPARTMENT_OTHER)
Admission: EM | Admit: 2023-05-26 | Discharge: 2023-05-26 | Disposition: A | Discharge status: Home / Self Care | Payer: Medicare Other | Attending: Emergency Medicine | Admitting: Emergency Medicine

## 2023-05-26 ENCOUNTER — Encounter (HOSPITAL_BASED_OUTPATIENT_CLINIC_OR_DEPARTMENT_OTHER): Payer: Self-pay

## 2023-05-26 ENCOUNTER — Emergency Department (HOSPITAL_BASED_OUTPATIENT_CLINIC_OR_DEPARTMENT_OTHER): Payer: Medicare Other

## 2023-05-26 DIAGNOSIS — R109 Unspecified abdominal pain: Secondary | ICD-10-CM | POA: Diagnosis present

## 2023-05-26 DIAGNOSIS — N186 End stage renal disease: Secondary | ICD-10-CM | POA: Insufficient documentation

## 2023-05-26 DIAGNOSIS — I5022 Chronic systolic (congestive) heart failure: Secondary | ICD-10-CM | POA: Diagnosis not present

## 2023-05-26 DIAGNOSIS — E1022 Type 1 diabetes mellitus with diabetic chronic kidney disease: Secondary | ICD-10-CM | POA: Diagnosis not present

## 2023-05-26 DIAGNOSIS — Z992 Dependence on renal dialysis: Secondary | ICD-10-CM | POA: Insufficient documentation

## 2023-05-26 DIAGNOSIS — R112 Nausea with vomiting, unspecified: Secondary | ICD-10-CM | POA: Diagnosis not present

## 2023-05-26 DIAGNOSIS — N3090 Cystitis, unspecified without hematuria: Secondary | ICD-10-CM | POA: Insufficient documentation

## 2023-05-26 DIAGNOSIS — R4182 Altered mental status, unspecified: Secondary | ICD-10-CM | POA: Insufficient documentation

## 2023-05-26 LAB — CBC WITH DIFFERENTIAL/PLATELET
Abs Immature Granulocytes: 0.01 10*3/uL (ref 0.00–0.07)
Basophils Absolute: 0.1 10*3/uL (ref 0.0–0.1)
Basophils Relative: 1 %
Eosinophils Absolute: 0.1 10*3/uL (ref 0.0–0.5)
Eosinophils Relative: 3 %
HCT: 41 % (ref 36.0–46.0)
Hemoglobin: 13.3 g/dL (ref 12.0–15.0)
Immature Granulocytes: 0 %
Lymphocytes Relative: 18 %
Lymphs Abs: 0.8 10*3/uL (ref 0.7–4.0)
MCH: 31.9 pg (ref 26.0–34.0)
MCHC: 32.4 g/dL (ref 30.0–36.0)
MCV: 98.3 fL (ref 80.0–100.0)
Monocytes Absolute: 0.4 10*3/uL (ref 0.1–1.0)
Monocytes Relative: 10 %
Neutro Abs: 3.2 10*3/uL (ref 1.7–7.7)
Neutrophils Relative %: 68 %
Platelets: 201 10*3/uL (ref 150–400)
RBC: 4.17 MIL/uL (ref 3.87–5.11)
RDW: 16.5 % — ABNORMAL HIGH (ref 11.5–15.5)
WBC: 4.6 10*3/uL (ref 4.0–10.5)
nRBC: 0 % (ref 0.0–0.2)

## 2023-05-26 LAB — COMPREHENSIVE METABOLIC PANEL
ALT: 19 U/L (ref 0–44)
AST: 25 U/L (ref 15–41)
Albumin: 3.9 g/dL (ref 3.5–5.0)
Alkaline Phosphatase: 103 U/L (ref 38–126)
Anion gap: 13 (ref 5–15)
BUN: 19 mg/dL (ref 6–20)
CO2: 23 mmol/L (ref 22–32)
Calcium: 9.6 mg/dL (ref 8.9–10.3)
Chloride: 98 mmol/L (ref 98–111)
Creatinine, Ser: 3.9 mg/dL — ABNORMAL HIGH (ref 0.44–1.00)
GFR, Estimated: 15 mL/min — ABNORMAL LOW (ref >60)
Glucose, Bld: 217 mg/dL — ABNORMAL HIGH (ref 70–99)
Potassium: 4.7 mmol/L (ref 3.5–5.1)
Sodium: 134 mmol/L — ABNORMAL LOW (ref 135–145)
Total Bilirubin: 1 mg/dL (ref 0.3–1.2)
Total Protein: 8.4 g/dL — ABNORMAL HIGH (ref 6.5–8.1)

## 2023-05-26 LAB — ETHANOL: Alcohol, Ethyl (B): <10 mg/dL (ref <10)

## 2023-05-26 LAB — LIPASE, BLOOD: Lipase: 20 U/L (ref 11–51)

## 2023-05-26 LAB — CBG MONITORING, ED: Glucose-Capillary: 200 mg/dL — ABNORMAL HIGH (ref 70–99)

## 2023-05-26 LAB — HCG, SERUM, QUALITATIVE: Preg, Serum: NEGATIVE

## 2023-05-26 MED ORDER — DOXYCYCLINE HYCLATE 100 MG PO TABS
100.0000 mg | ORAL_TABLET | Freq: Once | ORAL | Status: AC
  Administered 2023-05-26: 100 mg via ORAL
  Filled 2023-05-26: qty 1

## 2023-05-26 MED ORDER — DIPHENHYDRAMINE HCL 50 MG/ML IJ SOLN
12.5000 mg | Freq: Once | INTRAMUSCULAR | Status: AC
  Administered 2023-05-26: 12.5 mg via INTRAVENOUS
  Filled 2023-05-26: qty 1

## 2023-05-26 MED ORDER — OXYCODONE HCL 5 MG PO TABS
5.0000 mg | ORAL_TABLET | ORAL | 0 refills | Status: DC | PRN
Start: 2023-05-26 — End: 2023-06-10

## 2023-05-26 MED ORDER — METOCLOPRAMIDE HCL 10 MG PO TABS: 10.0000 mg | ORAL_TABLET | Freq: Four times a day (QID) | ORAL | 0 refills | Status: DC | PRN

## 2023-05-26 MED ORDER — METOCLOPRAMIDE HCL 5 MG/ML IJ SOLN
10.0000 mg | Freq: Once | INTRAMUSCULAR | Status: AC
  Administered 2023-05-26: 10 mg via INTRAVENOUS
  Filled 2023-05-26: qty 2

## 2023-05-26 MED ORDER — HYDROMORPHONE HCL 1 MG/ML IJ SOLN
1.0000 mg | Freq: Once | INTRAMUSCULAR | Status: AC
  Administered 2023-05-26: 1 mg via INTRAVENOUS
  Filled 2023-05-26: qty 1

## 2023-05-26 MED ORDER — DOXYCYCLINE HYCLATE 100 MG PO CAPS: 100.0000 mg | ORAL_CAPSULE | Freq: Two times a day (BID) | ORAL | 0 refills | Status: DC

## 2023-05-26 MED ORDER — ONDANSETRON HCL 4 MG/2ML IJ SOLN
4.0000 mg | Freq: Once | INTRAMUSCULAR | Status: AC
  Administered 2023-05-26: 4 mg via INTRAVENOUS
  Filled 2023-05-26: qty 2

## 2023-05-26 MED ORDER — SODIUM CHLORIDE 0.9 % IV BOLUS
500.0000 mL | Freq: Once | INTRAVENOUS | Status: AC
  Administered 2023-05-26: 500 mL via INTRAVENOUS

## 2023-05-26 MED ORDER — FENTANYL CITRATE PF 50 MCG/ML IJ SOSY
25.0000 ug | PREFILLED_SYRINGE | Freq: Once | INTRAMUSCULAR | Status: AC
  Administered 2023-05-26: 25 ug via INTRAVENOUS
  Filled 2023-05-26: qty 1

## 2023-05-26 MED ORDER — SODIUM CHLORIDE 0.9 % IV BOLUS
250.0000 mL | Freq: Once | INTRAVENOUS | Status: AC
  Administered 2023-05-26: 250 mL via INTRAVENOUS

## 2023-05-26 NOTE — Discharge Instructions (Addendum)
As we discussed, your CT scan showed possible urinary tract infection.  I sent in some doxycycline to treat urinary tract infection.  I has also prescribed Reglan for nausea  I have prescribed oxycodone for severe pain  Please follow-up with your nephrologist and keep your dialysis appointment on Tuesday  Return to ER if you have severe abdominal pain or vomiting or fever or lethargy

## 2023-05-26 NOTE — ED Notes (Signed)
Pt awake and alert sitting up in bed with finger in mouth attempting to try and make herself vomit. Meds administered per EDP order for nausea and pain

## 2023-05-26 NOTE — ED Provider Notes (Signed)
Emergency Department Provider Note   I have reviewed the triage vital signs and the nursing notes.   HISTORY  Chief Complaint Altered Mental Status   HPI Kathy Lewis is a 27 y.o. female past history of systolic congestive heart failure, type 1 diabetes, ESRD on hemodialysis presents to the emergency department with altered mental status, abdominal pain, vomiting.  Patient tells me that she had hemodialysis this morning and left there at 10 AM.  She returned home and developed abdominal pain, which is a fairly common complaint for her.  She denies chest pain or severe headache.  She was transported to this emergency department by a family member and was less responsive on arrival. Patient denies new medication or taking pain medication PTA. She is following commands but history is somewhat limited at this time. Level 5 caveat applies.    Past Medical History:  Diagnosis Date   Anemia    Anxiety    Bipolar 2 disorder (HCC)    Chronic kidney disease    Chronic systolic (congestive) heart failure (HCC)    Depression    DKA (diabetic ketoacidoses)    ESRD on peritoneal dialysis (HCC)    HSV infection    on valtrex   Hypokalemia    Leukocytosis    Migraine    Noncompliance with medication regimen    Preeclampsia    Prolonged QT syndrome    Severe anemia    Type 1 diabetes mellitus (HCC)     Review of Systems  Level 5 caveat: AMS  ____________________________________________   PHYSICAL EXAM:  VITAL SIGNS: BP: 133/98 Temp: 97.7 F Pulse: 97 Resp 12  Constitutional: Appears somewhat drowsy but answering basic questions and following commands.  Eyes: Conjunctivae are normal. PERRL (4mm).  Head: Atraumatic. Nose: No congestion/rhinnorhea. Mouth/Throat: Mucous membranes are dry.  Neck: No stridor.   Cardiovascular: Normal rate, regular rhythm. Good peripheral circulation. Grossly normal heart sounds.   Respiratory: Normal respiratory effort.  No retractions. Lungs  CTAB. Gastrointestinal: Soft and nontender. No distention. Musculoskeletal: No lower extremity tenderness nor edema. No gross deformities of extremities. Neurologic:  Normal speech and language. No gross focal neurologic deficits are appreciated.  Skin:  Skin is warm, dry and intact. No rash noted.  ____________________________________________   LABS (all labs ordered are listed, but only abnormal results are displayed)  Labs Reviewed  COMPREHENSIVE METABOLIC PANEL - Abnormal; Notable for the following components:      Result Value   Sodium 134 (*)    Glucose, Bld 217 (*)    Creatinine, Ser 3.90 (*)    Total Protein 8.4 (*)    GFR, Estimated 15 (*)    All other components within normal limits  CBC WITH DIFFERENTIAL/PLATELET - Abnormal; Notable for the following components:   RDW 16.5 (*)    All other components within normal limits  CBG MONITORING, ED - Abnormal; Notable for the following components:   Glucose-Capillary 200 (*)    All other components within normal limits  LIPASE, BLOOD  ETHANOL  HCG, SERUM, QUALITATIVE   ____________________________________________  EKG   EKG Interpretation Date/Time:  Saturday May 26 2023 13:56:01 EDT Ventricular Rate:  106 PR Interval:  148 QRS Duration:  102 QT Interval:  367 QTC Calculation: 488 R Axis:   255  Text Interpretation: Sinus tachycardia Biatrial enlargement Right superior axis Borderline repolarization abnormality Borderline prolonged QT interval Baseline wander in lead(s) III Confirmed by Alona Bene [phone]) on 05/26/2023 2:06:57 PM  ____________________________________________   PROCEDURES  Procedure(s) performed:   Procedures  None ____________________________________________   INITIAL IMPRESSION / ASSESSMENT AND PLAN / ED COURSE  Pertinent labs & imaging results that were available during my care of the patient were reviewed by me and considered in my medical decision making (see chart  for details).   This patient is Presenting for Evaluation of AMS, which does require a range of treatment options, and is a complaint that involves a high risk of morbidity and mortality.  The Differential Diagnoses includes but is not exclusive to alcohol, illicit or prescription medications, intracranial pathology such as stroke, intracerebral hemorrhage, fever or infectious causes including sepsis, hypoxemia, uremia, trauma, endocrine related disorders such as diabetes, hypoglycemia, thyroid-related diseases, etc.   Critical Interventions-    Medications  sodium chloride 0.9 % bolus 250 mL (0 mLs Intravenous Stopped 05/26/23 1431)  fentaNYL (SUBLIMAZE) injection 25 mcg (25 mcg Intravenous Given 05/26/23 1402)  ondansetron (ZOFRAN) injection 4 mg (4 mg Intravenous Given 05/26/23 1401)  sodium chloride 0.9 % bolus 500 mL (0 mLs Intravenous Stopped 05/26/23 1629)  metoCLOPramide (REGLAN) injection 10 mg (10 mg Intravenous Given 05/26/23 1602)  diphenhydrAMINE (BENADRYL) injection 12.5 mg (12.5 mg Intravenous Given 05/26/23 1604)  HYDROmorphone (DILAUDID) injection 1 mg (1 mg Intravenous Given 05/26/23 1605)  doxycycline (VIBRA-TABS) tablet 100 mg (100 mg Oral Given 05/26/23 1717)    Reassessment after intervention: symptoms improved.     I did obtain Additional Historical Information from family at bedside.   I decided to review pertinent External Data, and in summary patient with similar ED presentations in the recent past.   Clinical Laboratory Tests Ordered, included LFTs and lipase negative. Pregnancy negative.   Radiologic Tests Ordered, included CXR. I independently interpreted the images and agree with radiology interpretation.   Cardiac Monitor Tracing which shows NSR.    Social Determinants of Health Risk patient is a non-smoker.    Medical Decision Making: Summary:  Patient presents to the ED with AMS. She is drowsy appearing but follows commands to loud voice. GCS not to the  point or requiring airway protection. Plan for AMS workup and reassess.   Reevaluation with update and discussion with patient. More alert after IVF and pain medication. Imaging pending. Care transferred to Dr. Valora Piccolo.   Considered admission but workup is pending.   Patient's presentation is most consistent with acute presentation with potential threat to life or bodily function.   Disposition: pending  ____________________________________________  FINAL CLINICAL IMPRESSION(S) / ED DIAGNOSES  Final diagnoses:  Cystitis  Nausea and vomiting, unspecified vomiting type     NEW OUTPATIENT MEDICATIONS STARTED DURING THIS VISIT:  Discharge Medication List as of 05/26/2023  5:13 PM     START taking these medications   Details  doxycycline (VIBRAMYCIN) 100 MG capsule Take 1 capsule (100 mg total) by mouth 2 (two) times daily. One po bid x 7 days, Starting Sat 05/26/2023, Normal    metoCLOPramide (REGLAN) 10 MG tablet Take 1 tablet (10 mg total) by mouth every 6 (six) hours as needed for nausea (nausea/headache)., Starting Sat 05/26/2023, Normal    oxyCODONE (ROXICODONE) 5 MG immediate release tablet Take 1 tablet (5 mg total) by mouth every 4 (four) hours as needed for severe pain., Starting Sat 05/26/2023, Normal        Note:  This document was prepared using Dragon voice recognition software and may include unintentional dictation errors.  Alona Bene, MD, [facility] Emergency Medicine    Bodey Frizell, Arlyss Repress,  MD 06/05/23 1636

## 2023-05-26 NOTE — ED Triage Notes (Signed)
Pt accompanied by father. Brought in complaint of unresponsive, pt is minimally responsive but follows commands. Complains of abdominal pain. No further vomiting

## 2023-05-26 NOTE — ED Provider Notes (Signed)
  Physical Exam  BP 116/89   Pulse 98   Temp 98.4 F (36.9 C)   Resp (!) 24   LMP  (LMP Unknown)   SpO2 100%   Physical Exam  Procedures  Procedures  ED Course / MDM    Medical Decision Making Care assumed that 3 PM.  Patient is dialysis patient and finished dialysis today.  Patient had abdominal pain and some confusion and vomiting.  Patient has history of gastroparesis as well.  Signout pending CT abdomen pelvis and CT head results as well as reassessment.  4 pm Patient is still in pain.  I ordered Dilaudid and Reglan.  Will reassess patient  5:08 PM I reassessed patient and patient is now feeling better.  Patient is no longer vomiting.  I reviewed patient's labs and independently interpreted imaging studies.  CT showed possible cystitis.  She also has free intraperitoneal fluid.  Patient previously had peritoneal dialysis.  Patient does not urinate every day.  She denies any dysuria.  Given CT finding, I think is reasonable to give a course of doxycycline.  Will also discharge home with Reglan and pain medicine.  Encouraged her to follow-up with her nephrologist.  Problems Addressed: Cystitis: acute illness or injury Nausea and vomiting, unspecified vomiting type: acute illness or injury  Amount and/or Complexity of Data Reviewed Labs: ordered. Decision-making details documented in ED Course. Radiology: ordered and independent interpretation performed. Decision-making details documented in ED Course.  Risk Prescription drug management.          Charlynne Pander, MD 05/26/23 [phone]

## 2023-05-26 NOTE — ED Notes (Signed)
O2 @ 2 [place] initiated after administration of pain medication. EDP notified

## 2023-05-26 NOTE — ED Notes (Signed)
Awake and alert Reviewed discharge instructions, follow and medications with pt. Pt states understanding. Ambulatory at time of discharge.

## 2023-05-27 ENCOUNTER — Emergency Department (HOSPITAL_COMMUNITY): Payer: Medicare Other

## 2023-05-27 ENCOUNTER — Encounter (HOSPITAL_COMMUNITY): Payer: Self-pay

## 2023-05-27 ENCOUNTER — Emergency Department (HOSPITAL_COMMUNITY)
Admission: EM | Admit: 2023-05-27 | Discharge: 2023-05-27 | Disposition: A | Discharge status: Home / Self Care | Payer: Medicare Other | Attending: Emergency Medicine | Admitting: Emergency Medicine

## 2023-05-27 ENCOUNTER — Other Ambulatory Visit: Payer: Self-pay

## 2023-05-27 DIAGNOSIS — R112 Nausea with vomiting, unspecified: Secondary | ICD-10-CM | POA: Insufficient documentation

## 2023-05-27 DIAGNOSIS — D631 Anemia in chronic kidney disease: Secondary | ICD-10-CM | POA: Insufficient documentation

## 2023-05-27 DIAGNOSIS — Z7901 Long term (current) use of anticoagulants: Secondary | ICD-10-CM | POA: Insufficient documentation

## 2023-05-27 DIAGNOSIS — R1084 Generalized abdominal pain: Secondary | ICD-10-CM | POA: Diagnosis not present

## 2023-05-27 DIAGNOSIS — R7989 Other specified abnormal findings of blood chemistry: Secondary | ICD-10-CM | POA: Insufficient documentation

## 2023-05-27 DIAGNOSIS — Z794 Long term (current) use of insulin: Secondary | ICD-10-CM | POA: Insufficient documentation

## 2023-05-27 DIAGNOSIS — R55 Syncope and collapse: Secondary | ICD-10-CM | POA: Insufficient documentation

## 2023-05-27 DIAGNOSIS — Z992 Dependence on renal dialysis: Secondary | ICD-10-CM | POA: Diagnosis not present

## 2023-05-27 DIAGNOSIS — N186 End stage renal disease: Secondary | ICD-10-CM | POA: Diagnosis not present

## 2023-05-27 DIAGNOSIS — I5022 Chronic systolic (congestive) heart failure: Secondary | ICD-10-CM | POA: Insufficient documentation

## 2023-05-27 DIAGNOSIS — E1022 Type 1 diabetes mellitus with diabetic chronic kidney disease: Secondary | ICD-10-CM | POA: Diagnosis not present

## 2023-05-27 LAB — COMPREHENSIVE METABOLIC PANEL
ALT: 20 U/L (ref 0–44)
AST: 21 U/L (ref 15–41)
Albumin: 3.9 g/dL (ref 3.5–5.0)
Alkaline Phosphatase: 106 U/L (ref 38–126)
Anion gap: 15 (ref 5–15)
BUN: 25 mg/dL — ABNORMAL HIGH (ref 6–20)
CO2: 23 mmol/L (ref 22–32)
Calcium: 9.7 mg/dL (ref 8.9–10.3)
Chloride: 98 mmol/L (ref 98–111)
Creatinine, Ser: 5.13 mg/dL — ABNORMAL HIGH (ref 0.44–1.00)
GFR, Estimated: 11 mL/min — ABNORMAL LOW (ref >60)
Glucose, Bld: 89 mg/dL (ref 70–99)
Potassium: 4.5 mmol/L (ref 3.5–5.1)
Sodium: 136 mmol/L (ref 135–145)
Total Bilirubin: 1.5 mg/dL — ABNORMAL HIGH (ref 0.3–1.2)
Total Protein: 8 g/dL (ref 6.5–8.1)

## 2023-05-27 LAB — CBC
HCT: 43.8 % (ref 36.0–46.0)
Hemoglobin: 13.8 g/dL (ref 12.0–15.0)
MCH: 31.4 pg (ref 26.0–34.0)
MCHC: 31.5 g/dL (ref 30.0–36.0)
MCV: 99.5 fL (ref 80.0–100.0)
Platelets: 207 10*3/uL (ref 150–400)
RBC: 4.4 MIL/uL (ref 3.87–5.11)
RDW: 16.2 % — ABNORMAL HIGH (ref 11.5–15.5)
WBC: 4.7 10*3/uL (ref 4.0–10.5)
nRBC: 0 % (ref 0.0–0.2)

## 2023-05-27 LAB — TROPONIN I (HIGH SENSITIVITY)
Troponin I (High Sensitivity): 81 ng/L — ABNORMAL HIGH (ref <18)
Troponin I (High Sensitivity): 82 ng/L — ABNORMAL HIGH (ref <18)

## 2023-05-27 LAB — MAGNESIUM: Magnesium: 2.1 mg/dL (ref 1.7–2.4)

## 2023-05-27 LAB — BRAIN NATRIURETIC PEPTIDE: B Natriuretic Peptide: >4500 pg/mL — ABNORMAL HIGH (ref 0.0–100.0)

## 2023-05-27 MED ORDER — HYDROCODONE-ACETAMINOPHEN 5-325 MG PO TABS
1.0000 | ORAL_TABLET | Freq: Once | ORAL | Status: AC
  Administered 2023-05-27: 1 via ORAL
  Filled 2023-05-27: qty 1

## 2023-05-27 MED ORDER — TRIMETHOBENZAMIDE HCL 100 MG/ML IM SOLN
200.0000 mg | Freq: Once | INTRAMUSCULAR | Status: AC
  Administered 2023-05-27: 200 mg via INTRAMUSCULAR
  Filled 2023-05-27: qty 2

## 2023-05-27 MED ORDER — HYDROMORPHONE HCL 1 MG/ML IJ SOLN
0.5000 mg | Freq: Once | INTRAMUSCULAR | Status: AC
  Administered 2023-05-27: 0.5 mg via INTRAVENOUS
  Filled 2023-05-27: qty 1

## 2023-05-27 NOTE — ED Provider Notes (Signed)
[place] EMERGENCY DEPARTMENT AT [facility] Provider Note   CSN: [id number] Arrival date & time: 05/27/23  0038     History  Chief Complaint  Patient presents with   Abdominal Pain    Kathy Lewis is a 27 y.o. female.  27 year old female brought in by EMS from home.  Patient reports for the past year having upper abdominal pain with nausea and vomiting.  Patient states the only thing that helps her pain is Reglan, Benadryl, Dilaudid.  She does not have this pain daily but does have frequent bouts.  She has tried CBD and THC however has discontinued the [facility] as she is trying to get a renal transplant.  Attends dialysis Tuesday, Thursday, Saturday, last full session of dialysis was 05/26/2023.  Patient went to the emergency room after dialysis for an episode of pain and vomiting.  Her symptoms resolved with the medications.  She was told that she had cystitis with start antibiotic.  She took the antibiotic on an empty stomach today, then developed intense nausea that did not improve with her Reglan.  Patient's mom was helping her get up and ready to come to the emergency room when she began to feel very weak and passed out, patient's mom assisted her to the ground, she did not fall or hit her head. Past medical history significant for ESRD on peritoneal dialysis, chronic systolic heart failure, DKA, type 1 diabetes, bipolar disorder, depression, migraines, anemia.       Home Medications Prior to Admission medications   Medication Sig Start Date End Date Taking? Authorizing Provider  acetaminophen (TYLENOL) 500 MG tablet Take 1,000 mg by mouth daily as needed for headache (pain).   Yes [provider]  amitriptyline (ELAVIL) 50 MG tablet Take 1 tablet (50 mg total) by mouth at bedtime. 08/30/22  Yes Toy Cookey E, NP  atorvastatin (LIPITOR) 40 MG tablet Take 1 tablet (40 mg total) by mouth every evening. 05/07/23  Yes Bensimhon, Bevelyn Buckles, MD  B Complex-C-Folic Acid  (RENA-VITE RX) 1 MG TABS Take 1 tablet by mouth daily. 01/01/23  Yes [provider]  calcium acetate (PHOSLO) 667 MG capsule Take 1,334 mg by mouth 3 (three) times daily with meals. 09/28/22  Yes [provider]  carvedilol (COREG) 25 MG tablet Take 1 tablet (25 mg total) by mouth 2 (two) times daily with a meal. 05/07/23  Yes Bensimhon, Bevelyn Buckles, MD  clonazePAM (KLONOPIN) 0.5 MG tablet Take 1 tablet (0.5 mg total) by mouth 2 (two) times daily as needed for anxiety. 03/16/22  Yes Leroy Sea, MD  doxycycline (VIBRAMYCIN) 100 MG capsule Take 1 capsule (100 mg total) by mouth 2 (two) times daily. One po bid x 7 days 05/26/23  Yes Charlynne Pander, MD  ELIQUIS 2.5 MG TABS tablet Take 2.5 mg by mouth 2 (two) times daily.   Yes [provider]  hydrALAZINE (APRESOLINE) 100 MG tablet Take 1 tablet (100 mg total) by mouth 2 (two) times daily. 05/07/23  Yes Bensimhon, Bevelyn Buckles, MD  hydrOXYzine (ATARAX) 10 MG tablet Take 10 mg by mouth daily. Taken before dialysis on Tues, Thurs, Sat   Yes [provider]  insulin lispro (HUMALOG) 100 UNIT/ML injection Inject 0.5 Units into the skin See admin instructions. 0.5 units per hour via insulin pump. 01/08/22  Yes [provider]  isosorbide mononitrate (IMDUR) 30 MG 24 hr tablet Take 1 tablet (30 mg total) by mouth daily. 10/09/22 11/20/23 Yes Arrien, York Ram,  MD  magnesium oxide (MAG-OX) 400 MG tablet Take 2 tablets (800 mg total) by mouth daily. 05/07/23  Yes Bensimhon, Bevelyn Buckles, MD  metoCLOPramide (REGLAN) 10 MG tablet Take 1 tablet (10 mg total) by mouth every 6 (six) hours as needed for nausea (nausea/headache). 05/26/23  Yes Charlynne Pander, MD  omeprazole (PRILOSEC) 20 MG capsule Take 20 mg by mouth daily. 04/11/23  Yes [provider]  sacubitril-valsartan (ENTRESTO) 24-26 MG Take 1 tablet by mouth 2 (two) times daily. 05/07/23  Yes Bensimhon, Bevelyn Buckles, MD  SUMAtriptan (IMITREX) 50 MG tablet Take 50 mg by  mouth daily as needed for headache.   Yes [provider]  valACYclovir (VALTREX) 500 MG tablet Take 500 mg by mouth daily as needed (cold sores). 04/17/22  Yes [provider]  Vitamin D, Ergocalciferol, (DRISDOL) 1.25 MG (50000 UNIT) CAPS capsule Take 50,000 Units by mouth every Sunday.   Yes [provider]  oxyCODONE (ROXICODONE) 5 MG immediate release tablet Take 1 tablet (5 mg total) by mouth every 4 (four) hours as needed for severe pain. Patient not taking: Reported on 05/27/2023 05/26/23   Charlynne Pander, MD  escitalopram (LEXAPRO) 10 MG tablet Take 20 mg by mouth daily.  05/10/20 10/06/20  [provider]  furosemide (LASIX) 40 MG tablet Take 1 tablet (40 mg total) by mouth daily. Patient not taking: Reported on 02/16/2021 10/28/19 02/17/21  Geryl Rankins, MD  lisinopril (ZESTRIL) 10 MG tablet Take 10 mg by mouth daily.  02/23/20 10/06/20  [provider]  spironolactone (ALDACTONE) 25 MG tablet Take 25 mg by mouth daily.  04/20/20 10/06/20  [provider]      Allergies    Cantaloupe extract allergy skin test, Citrullus vulgaris, Food, Nsaids, and Strawberry extract    Review of Systems   Review of Systems Negative except as per HPI Physical Exam Updated Vital Signs BP (!) 131/96   Pulse 92   Temp 98.2 F (36.8 C) (Oral)   Resp 20   Ht 5' (1.524 m)   Wt 58 kg   LMP  (LMP Unknown)   SpO2 90%   BMI 24.97 kg/m  Physical Exam Vitals and nursing note reviewed.  Constitutional:      General: She is not in acute distress.    Appearance: She is well-developed. She is not diaphoretic.  HENT:     Head: Normocephalic and atraumatic.  Cardiovascular:     Rate and Rhythm: Normal rate. Rhythm irregular.     Heart sounds: Normal heart sounds.  Pulmonary:     Effort: Pulmonary effort is normal.     Breath sounds: Normal breath sounds.  Abdominal:     Tenderness: There is generalized abdominal tenderness.  Musculoskeletal:      Right lower leg: No edema.     Left lower leg: No edema.  Skin:    General: Skin is warm and dry.     Findings: No erythema or rash.  Neurological:     Mental Status: She is alert and oriented to person, place, and time.  Psychiatric:        Behavior: Behavior normal.     ED Results / Procedures / Treatments   Labs (all labs ordered are listed, but only abnormal results are displayed) Labs Reviewed  COMPREHENSIVE METABOLIC PANEL - Abnormal; Notable for the following components:      Result Value   BUN 25 (*)    Creatinine, Ser 5.13 (*)    Total Bilirubin  1.5 (*)    GFR, Estimated 11 (*)    All other components within normal limits  CBC - Abnormal; Notable for the following components:   RDW 16.2 (*)    All other components within normal limits  TROPONIN I (HIGH SENSITIVITY) - Abnormal; Notable for the following components:   Troponin I (High Sensitivity) 81 (*)    All other components within normal limits  TROPONIN I (HIGH SENSITIVITY)    EKG EKG Interpretation Date/Time:  Sunday May 27 2023 01:50:59 EDT Ventricular Rate:  91 PR Interval:  157 QRS Duration:  101 QT Interval:  412 QTC Calculation: 507 R Axis:   -18  Text Interpretation: Sinus rhythm Probable left atrial enlargement LVH with secondary repolarization abnormality Prolonged QT interval Confirmed by Palumbo, April ([id number]) on 05/27/2023 1:52:27 AM  Radiology DG Chest Portable 1 View  Result Date: 05/27/2023 CLINICAL DATA:  Shortness of breath EXAM: PORTABLE CHEST 1 VIEW COMPARISON:  05/26/2019 FINDINGS: Cardiac shadow remains enlarged. Dialysis catheter is noted. Lungs are well aerated without focal infiltrate. No bony abnormality is noted. IMPRESSION: No active disease. Electronically Signed   By: Alcide Clever M.D.   On: 05/27/2023 01:40   DG Chest Portable 1 View  Result Date: 05/26/2023 CLINICAL DATA:  Altered mental status. EXAM: PORTABLE CHEST 1 VIEW COMPARISON:  Chest radiograph dated  05/17/2023. FINDINGS: The heart is markedly enlarged. There is mild left basilar atelectasis. The right lung is clear. No significant pleural effusion or pneumothorax. A right internal jugular central venous catheter tip overlies the right atrium. No significant bony abnormality. IMPRESSION: Marked cardiomegaly. Mild left basilar atelectasis. Electronically Signed   By: Romona Curls M.D.   On: 05/26/2023 15:24   CT ABDOMEN PELVIS WO CONTRAST  Result Date: 05/26/2023 CLINICAL DATA:  Abdominal pain. EXAM: CT ABDOMEN AND PELVIS WITHOUT CONTRAST TECHNIQUE: Multidetector CT imaging of the abdomen and pelvis was performed following the standard protocol without IV contrast. RADIATION DOSE REDUCTION: This exam was performed according to the departmental dose-optimization program which includes automated exposure control, adjustment of the mA and/or kV according to patient size and/or use of iterative reconstruction technique. COMPARISON:  CT abdomen and pelvis dated 02/19/2023. FINDINGS: Lower chest: The heart is markedly enlarged, unchanged. There is mild left lower lobe atelectasis. Hepatobiliary: No focal liver abnormality is seen. No gallstones, gallbladder wall thickening, or biliary dilatation. Pancreas: Unremarkable. No pancreatic ductal dilatation or surrounding inflammatory changes. Spleen: Normal in size without focal abnormality. Adrenals/Urinary Tract: Adrenal glands are unremarkable. Kidneys are normal, without renal calculi, suspicious focal lesion, or hydronephrosis. The urinary bladder wall is diffusely thickened, measuring up to 5 mm. Stomach/Bowel: Stomach is within normal limits. The appendix is not definitely visualized. No evidence of bowel wall thickening, distention, or inflammatory changes. Vascular/Lymphatic: Vascular calcifications are seen in the pelvis and involving the superior vena cava. No enlarged abdominal or pelvic lymph nodes. Reproductive: Uterus and bilateral adnexa are  unremarkable. Other: Small volume free intraperitoneal fluid. No abdominal wall hernia. Musculoskeletal: No acute or significant osseous findings. IMPRESSION: 1. Diffuse urinary bladder wall thickening may reflect cystitis. Correlation with urinalysis is recommended. 2. Small volume free intraperitoneal fluid. 3. Marked cardiomegaly. Electronically Signed   By: Romona Curls M.D.   On: 05/26/2023 15:22   CT Head Wo Contrast  Result Date: 05/26/2023 CLINICAL DATA:  Altered mental status. EXAM: CT HEAD WITHOUT CONTRAST TECHNIQUE: Contiguous axial images were obtained from the base of the skull through the vertex without intravenous contrast. RADIATION DOSE REDUCTION:  This exam was performed according to the departmental dose-optimization program which includes automated exposure control, adjustment of the mA and/or kV according to patient size and/or use of iterative reconstruction technique. COMPARISON:  CT head dated 10/04/2022. FINDINGS: Brain: No evidence of acute infarction, hemorrhage, hydrocephalus, extra-axial collection or mass lesion/mass effect. A chronic lacunar infarct in the superior right cerebellum is redemonstrated. Vascular: There are vascular calcifications in the carotid siphons. Skull: Normal. Negative for fracture or focal lesion. Sinuses/Orbits: No acute finding. Other: None. IMPRESSION: No acute intracranial pathology. Electronically Signed   By: Romona Curls M.D.   On: 05/26/2023 15:17    Procedures Procedures    Medications Ordered in ED Medications  trimethobenzamide (TIGAN) injection 200 mg (200 mg Intramuscular Given 05/27/23 0302)  HYDROmorphone (DILAUDID) injection 0.5 mg (0.5 mg Intravenous Given 05/27/23 0250)    ED Course/ Medical Decision Making/ A&P                                 Medical Decision Making Amount and/or Complexity of Data Reviewed Labs: ordered. Radiology: ordered.  Risk Prescription drug management. Decision regarding  hospitalization.   This patient presents to the ED for concern of epigastric pain, nausea, syncope, this involves an extensive number of treatment options, and is a complaint that carries with it a high risk of complications and morbidity.  The differential diagnosis includes but not limited to arrhythmia,    Co morbidities that complicate the patient evaluation  ESRD on peritoneal dialysis, chronic systolic heart failure, DKA, type 1 diabetes, bipolar disorder, depression, migraines, anemia.   Additional history obtained:  Additional history obtained from family at bedside and EMS who contributes to history as above External records from outside source obtained and reviewed including prior echo dated 05/07/23 with EF 20-25%   Lab Tests:  I Ordered, and personally interpreted labs.  The pertinent results include:  CBC and CMP without significant findings. Troponin 81, downtrend from 05/17/23 although higher than her baseline    Imaging Studies ordered:  I ordered imaging studies including CXR  I independently visualized and interpreted imaging which showed no acute process  I agree with the radiologist interpretation   Cardiac Monitoring: / EKG:  The patient was maintained on a cardiac monitor.  I personally viewed and interpreted the cardiac monitored which showed an underlying rhythm of: sinus rhythm, rate 91. QT prolongation    Consultations Obtained:  I requested consultation with the ER attending, Dr. Nicanor Alcon,  and discussed lab and imaging findings as well as pertinent plan - they recommend: admit for down trending troponin after elevation on 05/17/23, with syncope. Discussed with Dr. Lazarus Salines with [facility], hospitalist team will admit in the morning.    Problem List / ED Course / Critical interventions / Medication management  27 year old with multiple chronic health problems brought in by EMS from home for nausea and syncope. On chart review, concern for  troponin elevated 10 days ago, over baseline, now with syncope. Troponin elevated above baseline today, down trending from prior. Recommend admission for monitoring and further work up. I ordered medication including dilaudid, tigan  for pain, nausea  Reevaluation of the patient after these medicines showed that the patient stayed the same I have reviewed the patients home medicines and have made adjustments as needed   Social Determinants of Health:  Lives at home with family   Test / Admission - Considered:  admit  Final Clinical Impression(s) / ED Diagnoses Final diagnoses:  Syncope, unspecified syncope type  Elevated troponin    Rx / DC Orders ED Discharge Orders     None         Alden Hipp 05/27/23 1610    Palumbo, April, MD 05/27/23 0400

## 2023-05-27 NOTE — ED Notes (Signed)
This RN attempted to start Ivx2 with no success.

## 2023-05-27 NOTE — ED Triage Notes (Signed)
Patient BIB EMS from home due to abdominal pain. Patient states abdominal pain along with N/V has been going on for about a year. Patient Had syncopal episode at home prior to EMS arrival to which she was eased to the ground by mother. Patient is a dialysis patient tu, thu, sat, patient did receive full treatment most recent dialysis. Patient was recently seen at [facility] for abdominal pain and was diagnosed with a UTI and given abx. Patient is A&Ox4 at this time.

## 2023-05-27 NOTE — ED Notes (Signed)
Pt asking to eat/drink, hospitalist paged to come see patient to receive orders, no orders at this time.

## 2023-05-27 NOTE — Consult Note (Signed)
Initial Consultation Note   Patient: Kathy Lewis ZOX:[id number] DOB: May 13, 1996 PCP: Loura Pardon, PA DOA: 05/27/2023 DOS: the patient was seen and examined on 05/27/2023 Primary service: No att. providers found  Referring physician: ED provider Reason for consult: Syncope  Assessment and Plan: Concern of syncopal episode.  No loss of consciousness.  Labs and imaging reassuring.  Mildly elevated QTc.  Significantly elevated BNP with history of HFrEF and ESRD but clinically appears euvolemic.  Chest x-ray normal.  Barely positive troponin and no chest pain.  Orthostatic vitals normal. Patient is compliant with routine dialysis, last dialysis session was on Saturday.  Patient wants to go home and appears at baseline.  Case was discussed with nephrology and she does not need any extra sessions of dialysis at this time and need to follow-up with her routine dialysis.  She will continue with her home medications and can go home and follow-up with her providers closely for further recommendations.   TRH will sign off at present, please call us again when needed. Patient does not need admission and can be discharged home.  HPI: Kathy Lewis is a 27 y.o. female with past medical history of type 1 diabetes mellitus, ESRD (TTS), last dialysis was on Saturday, 9/28, history of chronic nausea and vomiting and gastroparesis, chronic abdominal pain, hypertension, HFrEF with EF of 20 to 25% came to ED with concern of a syncopal episode.  Per patient she was having some nausea and vomiting yesterday and was feeling weak, her mother forced her to stand up and while trying to stand up she felt very weak in her legs her mother helped her to lay on ground, no fall or injuries, no loss of consciousness, she was brought to ED for further evaluation.   Patient denies any other recent illnesses, did completed session of dialysis yesterday.  She does not feel very off from her baseline.  No shortness of  breath, chest pain, upper respiratory symptoms or other recent illnesses.  No fever or chills.  No orthopnea or PND.  On arrival mildly elevated blood pressure and mild tachycardia, which improved later on.  Labs consistent with ESRD, mildly elevated troponin with no chest pain, BNP more than 4500 but patient is on dialysis and history of HFrEF with EF of 20 to 25% CT abdomen and pelvis with some bladder wall thickening, no urinary symptoms.  Significant cardiomegaly noted CT head without any acute abnormality Chest x-ray without any acute abnormality.  Negative orthostatic vitals.  Patient received Dilaudid and Tigan injection in ED.  She now appears at baseline and would like to go home  Review of Systems: As mentioned in the history of present illness. All other systems reviewed and are negative. Past Medical History:  Diagnosis Date   Anemia    Anxiety    Bipolar 2 disorder (HCC)    Chronic kidney disease    Chronic systolic (congestive) heart failure (HCC)    Depression    DKA (diabetic ketoacidoses)    ESRD on peritoneal dialysis (HCC)    HSV infection    on valtrex   Hypokalemia    Leukocytosis    Migraine    Noncompliance with medication regimen    Preeclampsia    Prolonged QT syndrome    Severe anemia    Type 1 diabetes mellitus (HCC)    Past Surgical History:  Procedure Laterality Date   BIOPSY  04/24/2022   Procedure: BIOPSY;  Surgeon: Jenel Lucks, MD;  Location: [facility] ENDOSCOPY;  Service: Gastroenterology;;   CARDIAC CATHETERIZATION     COLONOSCOPY WITH PROPOFOL N/A 04/24/2022   Procedure: COLONOSCOPY WITH PROPOFOL;  Surgeon: Jenel Lucks, MD;  Location: [facility] ENDOSCOPY;  Service: Gastroenterology;  Laterality: N/A;   DILATION AND EVACUATION N/A 10/22/2019   Procedure: ULTRASOUND GUIDED DILATATION AND EVACUATION;  Surgeon: Geryl Rankins, MD;  Location: [facility] LD ORS;  Service: Gynecology;  Laterality: N/A;   ESOPHAGOGASTRODUODENOSCOPY (EGD) WITH PROPOFOL  N/A 04/24/2022   Procedure: ESOPHAGOGASTRODUODENOSCOPY (EGD) WITH PROPOFOL;  Surgeon: Jenel Lucks, MD;  Location: [facility] ENDOSCOPY;  Service: Gastroenterology;  Laterality: N/A;   IR FLUORO GUIDE CV LINE RIGHT  09/14/2022   IR US GUIDE VASC ACCESS RIGHT  09/14/2022   peritoneal dialysis catheter insertion     RENAL BIOPSY     Social History:  reports that she has quit smoking. Her smoking use included cigars and cigarettes. She has a 2 pack-year smoking history. She has never used smokeless tobacco. She reports that she does not drink alcohol and does not use drugs.  Allergies  Allergen Reactions   Cantaloupe Extract Allergy Skin Test Itching    Mouth itching     Citrullus Vulgaris Itching and Other (See Comments)    Mouth itching  Makes mouth itch , ALL melons    Mouth itching   Food Itching    All melon - mouth itching   Nsaids Other (See Comments) and Itching    Avoid per nephrology  Avoid per nephrology    Avoid per nephrology Avoid per nephrology Avoid per nephrology Avoid per nephrology   Strawberry Extract Itching    Mouth itching    Family History  Adopted: Yes  Problem Relation Age of Onset   Heart disease Neg Hx     Prior to Admission medications   Medication Sig Start Date End Date Taking? Authorizing Provider  acetaminophen (TYLENOL) 500 MG tablet Take 1,000 mg by mouth daily as needed for headache (pain).   Yes [provider]  amitriptyline (ELAVIL) 50 MG tablet Take 1 tablet (50 mg total) by mouth at bedtime. 08/30/22  Yes Toy Cookey E, NP  atorvastatin (LIPITOR) 40 MG tablet Take 1 tablet (40 mg total) by mouth every evening. 05/07/23  Yes Bensimhon, Bevelyn Buckles, MD  B Complex-C-Folic Acid (RENA-VITE RX) 1 MG TABS Take 1 tablet by mouth daily. 01/01/23  Yes [provider]  calcium acetate (PHOSLO) 667 MG capsule Take 1,334 mg by mouth 3 (three) times daily with meals. 09/28/22  Yes [provider]  carvedilol (COREG) 25 MG  tablet Take 1 tablet (25 mg total) by mouth 2 (two) times daily with a meal. 05/07/23  Yes Bensimhon, Bevelyn Buckles, MD  clonazePAM (KLONOPIN) 0.5 MG tablet Take 1 tablet (0.5 mg total) by mouth 2 (two) times daily as needed for anxiety. 03/16/22  Yes Leroy Sea, MD  doxycycline (VIBRAMYCIN) 100 MG capsule Take 1 capsule (100 mg total) by mouth 2 (two) times daily. One po bid x 7 days 05/26/23  Yes Charlynne Pander, MD  ELIQUIS 2.5 MG TABS tablet Take 2.5 mg by mouth 2 (two) times daily.   Yes [provider]  hydrALAZINE (APRESOLINE) 100 MG tablet Take 1 tablet (100 mg total) by mouth 2 (two) times daily. 05/07/23  Yes Bensimhon, Bevelyn Buckles, MD  hydrOXYzine (ATARAX) 10 MG tablet Take 10 mg by mouth daily. Taken before dialysis on Tues, Thurs, Sat   Yes [provider]  insulin lispro (HUMALOG) 100 UNIT/ML injection Inject  0.5 Units into the skin See admin instructions. 0.5 units per hour via insulin pump. 01/08/22  Yes [provider]  isosorbide mononitrate (IMDUR) 30 MG 24 hr tablet Take 1 tablet (30 mg total) by mouth daily. 10/09/22 11/20/23 Yes Arrien, York Ram, MD  magnesium oxide (MAG-OX) 400 MG tablet Take 2 tablets (800 mg total) by mouth daily. 05/07/23  Yes Bensimhon, Bevelyn Buckles, MD  metoCLOPramide (REGLAN) 10 MG tablet Take 1 tablet (10 mg total) by mouth every 6 (six) hours as needed for nausea (nausea/headache). 05/26/23  Yes Charlynne Pander, MD  omeprazole (PRILOSEC) 20 MG capsule Take 20 mg by mouth daily. 04/11/23  Yes [provider]  sacubitril-valsartan (ENTRESTO) 24-26 MG Take 1 tablet by mouth 2 (two) times daily. 05/07/23  Yes Bensimhon, Bevelyn Buckles, MD  SUMAtriptan (IMITREX) 50 MG tablet Take 50 mg by mouth daily as needed for headache.   Yes [provider]  valACYclovir (VALTREX) 500 MG tablet Take 500 mg by mouth daily as needed (cold sores). 04/17/22  Yes [provider]  Vitamin D, Ergocalciferol, (DRISDOL) 1.25 MG (50000 UNIT)  CAPS capsule Take 50,000 Units by mouth every Sunday.   Yes [provider]  oxyCODONE (ROXICODONE) 5 MG immediate release tablet Take 1 tablet (5 mg total) by mouth every 4 (four) hours as needed for severe pain. Patient not taking: Reported on 05/27/2023 05/26/23   Charlynne Pander, MD  escitalopram (LEXAPRO) 10 MG tablet Take 20 mg by mouth daily.  05/10/20 10/06/20  [provider]  furosemide (LASIX) 40 MG tablet Take 1 tablet (40 mg total) by mouth daily. Patient not taking: Reported on 02/16/2021 10/28/19 02/17/21  Geryl Rankins, MD  lisinopril (ZESTRIL) 10 MG tablet Take 10 mg by mouth daily.  02/23/20 10/06/20  [provider]  spironolactone (ALDACTONE) 25 MG tablet Take 25 mg by mouth daily.  04/20/20 10/06/20  [provider]    Physical Exam: Vitals:   05/27/23 0745 05/27/23 0908 05/27/23 1045 05/27/23 1046  BP: (!) 149/120  111/78 (!) 122/90  Pulse: (!) 103  (!) 102 100  Resp: 10   18  Temp:  98.4 F (36.9 C)  98.1 F (36.7 C)  TempSrc:  Oral  Oral  SpO2: 98%   95%  Weight:      Height:       Vitals:   05/27/23 0745 05/27/23 0908 05/27/23 1045 05/27/23 1046  BP: (!) 149/120  111/78 (!) 122/90  Pulse: (!) 103  (!) 102 100  Resp: 10   18  Temp:  98.4 F (36.9 C)  98.1 F (36.7 C)  TempSrc:  Oral  Oral  SpO2: 98%   95%  Weight:      Height:       General: Vital signs reviewed.  Patient is well-developed and well-nourished, in no acute distress and cooperative with exam.  Head: Normocephalic and atraumatic. Eyes: EOMI, conjunctivae normal, no scleral icterus.  Neck: Supple, trachea midline, normal ROM, no JVD,  Cardiovascular: RRR, S1 normal, S2 normal, no murmurs, gallops, or rubs. Pulmonary/Chest: Clear to auscultation bilaterally, no wheezes, rales, or rhonchi. Abdominal: Soft, non-tender, non-distended, BS +, Extremities: No lower extremity edema bilaterally,  pulses symmetric and intact bilaterally. No cyanosis or  clubbing. Neurological: A&O x3, Strength is normal and symmetric bilaterally, cranial nerve II-XII are grossly intact, no focal motor deficit, sensory intact to light touch bilaterally.  Skin: Warm, dry and intact. No rashes or erythema. Psychiatric: Normal mood and affect.  Data Reviewed:  Prior data reviewed as mentioned above.  Family Communication: Discussed with patient Primary team communication: Discussed with ED provider and nephrology.  Patient does not need admission but prior ED provider has already left.  Thank you very much for involving [place] in the care of your patient.  Author: Arnetha Courser, MD 05/27/2023 10:55 AM  For on call review [email or web address].

## 2023-05-27 NOTE — ED Notes (Signed)
Pt received dc papers and verbalized understanding of f/u care. Pt brought out to ED entrance to family vehicle. Pt a&ox4,vss,nad.

## 2023-05-27 NOTE — ED Notes (Signed)
Pt provided with sandwich and apple juice.

## 2023-05-27 NOTE — ED Notes (Signed)
Hospitalist at bedside 

## 2023-05-28 ENCOUNTER — Encounter (HOSPITAL_COMMUNITY): Payer: Self-pay | Admitting: Emergency Medicine

## 2023-05-28 ENCOUNTER — Emergency Department (HOSPITAL_COMMUNITY)
Admission: EM | Admit: 2023-05-28 | Discharge: 2023-05-28 | Disposition: A | Discharge status: Home / Self Care | Payer: Medicare Other | Attending: Emergency Medicine | Admitting: Emergency Medicine

## 2023-05-28 DIAGNOSIS — Z79899 Other long term (current) drug therapy: Secondary | ICD-10-CM | POA: Diagnosis not present

## 2023-05-28 DIAGNOSIS — Z7901 Long term (current) use of anticoagulants: Secondary | ICD-10-CM | POA: Diagnosis not present

## 2023-05-28 DIAGNOSIS — E1022 Type 1 diabetes mellitus with diabetic chronic kidney disease: Secondary | ICD-10-CM | POA: Insufficient documentation

## 2023-05-28 DIAGNOSIS — R112 Nausea with vomiting, unspecified: Secondary | ICD-10-CM | POA: Diagnosis present

## 2023-05-28 DIAGNOSIS — R1013 Epigastric pain: Secondary | ICD-10-CM | POA: Insufficient documentation

## 2023-05-28 DIAGNOSIS — N186 End stage renal disease: Secondary | ICD-10-CM | POA: Insufficient documentation

## 2023-05-28 DIAGNOSIS — Z794 Long term (current) use of insulin: Secondary | ICD-10-CM | POA: Diagnosis not present

## 2023-05-28 DIAGNOSIS — Z992 Dependence on renal dialysis: Secondary | ICD-10-CM | POA: Insufficient documentation

## 2023-05-28 LAB — CBC WITH DIFFERENTIAL/PLATELET
Abs Immature Granulocytes: 0.02 10*3/uL (ref 0.00–0.07)
Basophils Absolute: 0.1 10*3/uL (ref 0.0–0.1)
Basophils Relative: 1 %
Eosinophils Absolute: 0 10*3/uL (ref 0.0–0.5)
Eosinophils Relative: 1 %
HCT: 38.2 % (ref 36.0–46.0)
Hemoglobin: 11.8 g/dL — ABNORMAL LOW (ref 12.0–15.0)
Immature Granulocytes: 0 %
Lymphocytes Relative: 16 %
Lymphs Abs: 0.7 10*3/uL (ref 0.7–4.0)
MCH: 31.7 pg (ref 26.0–34.0)
MCHC: 30.9 g/dL (ref 30.0–36.0)
MCV: 102.7 fL — ABNORMAL HIGH (ref 80.0–100.0)
Monocytes Absolute: 0.5 10*3/uL (ref 0.1–1.0)
Monocytes Relative: 10 %
Neutro Abs: 3.3 10*3/uL (ref 1.7–7.7)
Neutrophils Relative %: 72 %
Platelets: 182 10*3/uL (ref 150–400)
RBC: 3.72 MIL/uL — ABNORMAL LOW (ref 3.87–5.11)
RDW: 16.1 % — ABNORMAL HIGH (ref 11.5–15.5)
WBC: 4.6 10*3/uL (ref 4.0–10.5)
nRBC: 0 % (ref 0.0–0.2)

## 2023-05-28 LAB — COMPREHENSIVE METABOLIC PANEL
ALT: 18 U/L (ref 0–44)
AST: 26 U/L (ref 15–41)
Albumin: 3.5 g/dL (ref 3.5–5.0)
Alkaline Phosphatase: 89 U/L (ref 38–126)
Anion gap: 18 — ABNORMAL HIGH (ref 5–15)
BUN: 53 mg/dL — ABNORMAL HIGH (ref 6–20)
CO2: 16 mmol/L — ABNORMAL LOW (ref 22–32)
Calcium: 9.1 mg/dL (ref 8.9–10.3)
Chloride: 102 mmol/L (ref 98–111)
Creatinine, Ser: 7.89 mg/dL — ABNORMAL HIGH (ref 0.44–1.00)
GFR, Estimated: 7 mL/min — ABNORMAL LOW (ref >60)
Glucose, Bld: 173 mg/dL — ABNORMAL HIGH (ref 70–99)
Potassium: 5.4 mmol/L — ABNORMAL HIGH (ref 3.5–5.1)
Sodium: 136 mmol/L (ref 135–145)
Total Bilirubin: 1.1 mg/dL (ref 0.3–1.2)
Total Protein: 7.1 g/dL (ref 6.5–8.1)

## 2023-05-28 LAB — URINALYSIS, ROUTINE W REFLEX MICROSCOPIC
Bilirubin Urine: NEGATIVE
Glucose, UA: >=500 mg/dL — AB
Ketones, ur: NEGATIVE mg/dL
Leukocytes,Ua: NEGATIVE
Nitrite: NEGATIVE
Protein, ur: >=300 mg/dL — AB
Specific Gravity, Urine: 1.013 (ref 1.005–1.030)
pH: 5 (ref 5.0–8.0)

## 2023-05-28 LAB — PREGNANCY, URINE: Preg Test, Ur: NEGATIVE

## 2023-05-28 LAB — LIPASE, BLOOD: Lipase: 22 U/L (ref 11–51)

## 2023-05-28 LAB — CBG MONITORING, ED: Glucose-Capillary: 124 mg/dL — ABNORMAL HIGH (ref 70–99)

## 2023-05-28 MED ORDER — TRIMETHOBENZAMIDE HCL 100 MG/ML IM SOLN
200.0000 mg | Freq: Once | INTRAMUSCULAR | Status: AC
  Administered 2023-05-28: 200 mg via INTRAMUSCULAR
  Filled 2023-05-28: qty 2

## 2023-05-28 MED ORDER — DIAZEPAM 2 MG PO TABS: 2.0000 mg | ORAL_TABLET | Freq: Four times a day (QID) | ORAL | 0 refills | Status: DC | PRN

## 2023-05-28 MED ORDER — HYDROMORPHONE HCL 1 MG/ML IJ SOLN
1.0000 mg | Freq: Once | INTRAMUSCULAR | Status: AC
  Administered 2023-05-28: 1 mg via INTRAMUSCULAR
  Filled 2023-05-28: qty 1

## 2023-05-28 NOTE — ED Notes (Signed)
Notified A. Harris, PA of patient's BP 131/107 at the time of D/C, Harris, PA ordered to continue with D/C.

## 2023-05-28 NOTE — ED Triage Notes (Signed)
Pt here from home with c/o abd pain and n/v , pt was seen yesterday and given antibiotic s for an uti , today's sats up and down in triage 87% to 94 , on room air , pt placed on 2 liters in triage

## 2023-05-28 NOTE — ED Provider Notes (Signed)
[place] EMERGENCY DEPARTMENT AT [facility] Provider Note   CSN: [id number] Arrival date & time: 05/28/23  1407     History  Chief Complaint  Patient presents with   Abdominal Pain   Nausea   Shortness of Breath    Kathy Lewis is a 27 y.o. female with a past medical history of end-stage renal disease dialyzes TU/TH/SA, last full dialysis 05/26/2023, type 1 diabetes who presents emergency department with epigastric abdominal pain nausea and vomiting.  This appears to be a very common complaint for the patient.  She has been seen multiple times for the same.  The patient was recently seen for syncope yesterday.  She is also been seen recently for cystitis.  Patient has had 13 visits to the emergency department here and multiple other outpatient ER visits and admissions.  Patient's last episode of vomiting was about 1:00 PM today   Abdominal Pain Associated symptoms: shortness of breath   Shortness of Breath Associated symptoms: abdominal pain        Home Medications Prior to Admission medications   Medication Sig Start Date End Date Taking? Authorizing Provider  acetaminophen (TYLENOL) 500 MG tablet Take 1,000 mg by mouth daily as needed for headache (pain).    [provider]  amitriptyline (ELAVIL) 50 MG tablet Take 1 tablet (50 mg total) by mouth at bedtime. 08/30/22   Shanna Cisco, NP  atorvastatin (LIPITOR) 40 MG tablet Take 1 tablet (40 mg total) by mouth every evening. 05/07/23   Bensimhon, Bevelyn Buckles, MD  B Complex-C-Folic Acid (RENA-VITE RX) 1 MG TABS Take 1 tablet by mouth daily. 01/01/23   [provider]  calcium acetate (PHOSLO) 667 MG capsule Take 1,334 mg by mouth 3 (three) times daily with meals. 09/28/22   [provider]  carvedilol (COREG) 25 MG tablet Take 1 tablet (25 mg total) by mouth 2 (two) times daily with a meal. 05/07/23   Bensimhon, Bevelyn Buckles, MD  clonazePAM (KLONOPIN) 0.5 MG tablet Take 1 tablet (0.5 mg total) by  mouth 2 (two) times daily as needed for anxiety. 03/16/22   Leroy Sea, MD  doxycycline (VIBRAMYCIN) 100 MG capsule Take 1 capsule (100 mg total) by mouth 2 (two) times daily. One po bid x 7 days 05/26/23   Charlynne Pander, MD  ELIQUIS 2.5 MG TABS tablet Take 2.5 mg by mouth 2 (two) times daily.    [provider]  hydrALAZINE (APRESOLINE) 100 MG tablet Take 1 tablet (100 mg total) by mouth 2 (two) times daily. 05/07/23   Bensimhon, Bevelyn Buckles, MD  hydrOXYzine (ATARAX) 10 MG tablet Take 10 mg by mouth daily. Taken before dialysis on Tues, Thurs, Sat    [provider]  insulin lispro (HUMALOG) 100 UNIT/ML injection Inject 0.5 Units into the skin See admin instructions. 0.5 units per hour via insulin pump. 01/08/22   [provider]  isosorbide mononitrate (IMDUR) 30 MG 24 hr tablet Take 1 tablet (30 mg total) by mouth daily. 10/09/22 11/20/23  Arrien, York Ram, MD  magnesium oxide (MAG-OX) 400 MG tablet Take 2 tablets (800 mg total) by mouth daily. 05/07/23   Bensimhon, Bevelyn Buckles, MD  metoCLOPramide (REGLAN) 10 MG tablet Take 1 tablet (10 mg total) by mouth every 6 (six) hours as needed for nausea (nausea/headache). 05/26/23   Charlynne Pander, MD  omeprazole (PRILOSEC) 20 MG capsule Take 20 mg by mouth daily. 04/11/23   [provider]  oxyCODONE (ROXICODONE) 5 MG  immediate release tablet Take 1 tablet (5 mg total) by mouth every 4 (four) hours as needed for severe pain. Patient not taking: Reported on 05/27/2023 05/26/23   Charlynne Pander, MD  sacubitril-valsartan (ENTRESTO) 24-26 MG Take 1 tablet by mouth 2 (two) times daily. 05/07/23   Bensimhon, Bevelyn Buckles, MD  SUMAtriptan (IMITREX) 50 MG tablet Take 50 mg by mouth daily as needed for headache.    [provider]  valACYclovir (VALTREX) 500 MG tablet Take 500 mg by mouth daily as needed (cold sores). 04/17/22   [provider]  Vitamin D, Ergocalciferol, (DRISDOL) 1.25 MG (50000 UNIT) CAPS  capsule Take 50,000 Units by mouth every Sunday.    [provider]  escitalopram (LEXAPRO) 10 MG tablet Take 20 mg by mouth daily.  05/10/20 10/06/20  [provider]  furosemide (LASIX) 40 MG tablet Take 1 tablet (40 mg total) by mouth daily. Patient not taking: Reported on 02/16/2021 10/28/19 02/17/21  Geryl Rankins, MD  lisinopril (ZESTRIL) 10 MG tablet Take 10 mg by mouth daily.  02/23/20 10/06/20  [provider]  spironolactone (ALDACTONE) 25 MG tablet Take 25 mg by mouth daily.  04/20/20 10/06/20  [provider]      Allergies    Cantaloupe extract allergy skin test, Citrullus vulgaris, Food, Nsaids, and Strawberry extract    Review of Systems   Review of Systems  Respiratory:  Positive for shortness of breath.   Gastrointestinal:  Positive for abdominal pain.    Physical Exam Updated Vital Signs BP (!) 144/121 (BP Location: Left Arm)   Pulse 96   Temp 98.1 F (36.7 C)   Resp 19   LMP  (LMP Unknown)   SpO2 100%  Physical Exam Vitals and nursing note reviewed.  Constitutional:      General: She is not in acute distress.    Appearance: She is well-developed. She is not diaphoretic.  HENT:     Head: Normocephalic and atraumatic.     Right Ear: External ear normal.     Left Ear: External ear normal.     Nose: Nose normal.     Mouth/Throat:     Mouth: Mucous membranes are moist.  Eyes:     General: No scleral icterus.    Conjunctiva/sclera: Conjunctivae normal.  Cardiovascular:     Rate and Rhythm: Normal rate and regular rhythm.     Heart sounds: Normal heart sounds. No murmur heard.    No friction rub. No gallop.  Pulmonary:     Effort: Pulmonary effort is normal. No respiratory distress.     Breath sounds: Normal breath sounds.  Abdominal:     General: Bowel sounds are normal. There is no distension.     Palpations: Abdomen is soft. There is no mass.     Tenderness: There is no abdominal tenderness. There is no guarding.   Musculoskeletal:     Cervical back: Normal range of motion.  Skin:    General: Skin is warm and dry.  Neurological:     Mental Status: She is alert and oriented to person, place, and time.  Psychiatric:        Behavior: Behavior normal.     ED Results / Procedures / Treatments   Labs (all labs ordered are listed, but only abnormal results are displayed) Labs Reviewed  COMPREHENSIVE METABOLIC PANEL - Abnormal; Notable for the following components:      Result Value   Potassium 5.4 (*)    CO2 16 (*)  Glucose, Bld 173 (*)    BUN 53 (*)    Creatinine, Ser 7.89 (*)    GFR, Estimated 7 (*)    Anion gap 18 (*)    All other components within normal limits  CBC WITH DIFFERENTIAL/PLATELET - Abnormal; Notable for the following components:   RBC 3.72 (*)    Hemoglobin 11.8 (*)    MCV 102.7 (*)    RDW 16.1 (*)    All other components within normal limits  URINALYSIS, ROUTINE W REFLEX MICROSCOPIC - Abnormal; Notable for the following components:   APPearance HAZY (*)    Glucose, UA >=500 (*)    Hgb urine dipstick SMALL (*)    Protein, ur >=300 (*)    Bacteria, UA RARE (*)    All other components within normal limits  PREGNANCY, URINE  LIPASE, BLOOD  CBG MONITORING, ED    EKG EKG Interpretation Date/Time:  Monday May 28 2023 18:55:56 EDT Ventricular Rate:  89 PR Interval:  142 QRS Duration:  102 QT Interval:  389 QTC Calculation: 474 R Axis:   71  Text Interpretation: Sinus rhythm Probable left atrial enlargement Borderline T abnormalities, diffuse leads No significant change since prior 9/24 Confirmed by Meridee Score [phone]) on 05/28/2023 7:01:07 PM  Radiology DG Chest Portable 1 View  Result Date: 05/27/2023 CLINICAL DATA:  Shortness of breath EXAM: PORTABLE CHEST 1 VIEW COMPARISON:  05/26/2019 FINDINGS: Cardiac shadow remains enlarged. Dialysis catheter is noted. Lungs are well aerated without focal infiltrate. No bony abnormality is noted. IMPRESSION: No  active disease. Electronically Signed   By: Alcide Clever M.D.   On: 05/27/2023 01:40    Procedures Procedures    Medications Ordered in ED Medications - No data to display  ED Course/ Medical Decision Making/ A&P Clinical Course as of 05/28/23 1906  Mon May 28, 2023  6045 Emr review- EKG yesterday shows QTc 507.  [AH]    Clinical Course User Index [AH] Arthor Captain, PA-C                                 Medical Decision Making Amount and/or Complexity of Data Reviewed ECG/medicine tests: ordered.  Risk Prescription drug management.   This patient presents to the ED for concern of vomiting, this involves an extensive number of treatment options, and is a complaint that carries with it a high risk of complications and morbidity.  The emergent differential diagnosis for vomiting includes, but is not limited to ACS/MI, DKA, Ischemic bowel, Meningitis, Sepsis, Acute gastric dilation, Adrenal insufficiency, Appendicitis,  Bowel obstruction/ileus, Carbon monoxide poisoning, Cholecystitis, Electrolyte abnormalities, Elevated ICP, Gastric outlet obstruction, Pancreatitis, Ruptured viscus, Biliary colic, Cannabinoid hyperemesis syndrome, Gastritis, Gastroenteritis, Gastroparesis,  Narcotic withdrawal, Peptic ulcer disease, and UTI    Co morbidities:   has a past medical history of Anemia, Anxiety, Bipolar 2 disorder (HCC), Chronic kidney disease, Chronic systolic (congestive) heart failure (HCC), Depression, DKA (diabetic ketoacidoses), ESRD on peritoneal dialysis (HCC), HSV infection, Hypokalemia, Leukocytosis, Migraine, Noncompliance with medication regimen, Preeclampsia, Prolonged QT syndrome, Severe anemia, and Type 1 diabetes mellitus (HCC).\  Social Determinants of Health:   SDOH Screenings   Food Insecurity: No Food Insecurity (04/19/2023)   Received from [facility]  Housing: Low Risk  (10/04/2022)  Transportation Needs: No Transportation Needs (04/19/2023)   Received from  [facility]  Recent Concern: Transportation Needs - Unmet Transportation Needs (04/14/2023)   Received from [facility]  Utilities:  Not At Risk (04/19/2023)   Received from [facility]  Depression [phone]): High Risk (08/30/2022)  Financial Resource Strain: Low Risk  (04/19/2023)   Received from [facility]  Physical Activity: Unknown (04/19/2023)   Received from [facility]  Social Connections: Moderately Integrated (04/19/2023)   Received from [facility]  Stress: No Stress Concern Present (04/19/2023)   Received from [facility]  Tobacco Use: Medium Risk (05/28/2023)     Additional history:  {Additional history obtained from emr {External records from outside source obtained and reviewed including other ED visits  Lab Tests:  I Ordered, and personally interpreted labs.  The pertinent results include:   CBC without elevated white blood cell count, potassium 5.4, glucose 173, BUN and creatinine elevated consistent with history of ESRD.  She is due for dialysis tomorrow.  Imaging Studies:  None  Cardiac Monitoring/ECG:  The patient was maintained on a cardiac monitor.  I personally viewed and interpreted the cardiac monitored which showed an underlying rhythm of: sinus rhythm Normal QT today  Medicines ordered and prescription drug management:  I ordered medication including  Medications  trimethobenzamide (TIGAN) injection 200 mg (200 mg Intramuscular Given 05/28/23 2008)  HYDROmorphone (DILAUDID) injection 1 mg (1 mg Intramuscular Given 05/28/23 2008)   for nausea and vomiting Reevaluation of the patient after these medicines showed that the patient resolved I have reviewed the patients home medicines and have made adjustments as needed  Test Considered:    Critical Interventions:    Consultations Obtained:   Problem List / ED Course:     ICD-10-CM   1. Nausea and vomiting, unspecified vomiting type  R11.2       MDM: patient here w/ n/v chronic.  Not intractable. EKG w/ QT prolongation yesterday.  Will dc with Valium. Patient will need GI follow up.   Dispostion:  After consideration of the diagnostic results and the patients response to treatment, I feel that the patent would benefit from discharge with close GI follow up. .         Final Clinical Impression(s) / ED Diagnoses Final diagnoses:  None    Rx / DC Orders ED Discharge Orders     None         Arthor Captain, PA-C 05/31/23 1811    Terrilee Files, MD 06/01/23 1041

## 2023-05-28 NOTE — Discharge Instructions (Signed)

## 2023-05-28 NOTE — ED Provider Triage Note (Signed)
Emergency Medicine Provider Triage Evaluation Note  Kathy Lewis , a 27 y.o. female  was evaluated in triage.  Pt complains of epigastric abdominal pain, nausea, vomiting.  Patient states his symptoms began this morning.  States it feels similar to other times that she has had symptoms.  States that nausea and vomiting refractory to her at home medications.  Denies any marijuana or illicit substance use..  Review of Systems  Positive: See above Negative:   Physical Exam  BP (!) 162/123   Pulse 98   Temp (!) 97.4 F (36.3 C)   Resp 18   LMP  (LMP Unknown)   SpO2 100%  Gen:   Awake, no distress   Resp:  Normal effort  MSK:   Moves extremities without difficulty  Other:  Epigastric tenderness to palpation  Medical Decision Making  Medically screening exam initiated at 2:42 PM.  Appropriate orders placed.  Kathy Lewis was informed that the remainder of the evaluation will be completed by another provider, this initial triage assessment does not replace that evaluation, and the importance of remaining in the ED until their evaluation is complete.     Kathy Lewis, [place] 05/28/23 1452

## 2023-06-05 ENCOUNTER — Encounter (HOSPITAL_BASED_OUTPATIENT_CLINIC_OR_DEPARTMENT_OTHER): Payer: Self-pay

## 2023-06-05 ENCOUNTER — Other Ambulatory Visit: Payer: Self-pay

## 2023-06-05 ENCOUNTER — Emergency Department (HOSPITAL_BASED_OUTPATIENT_CLINIC_OR_DEPARTMENT_OTHER): Payer: 59

## 2023-06-05 ENCOUNTER — Inpatient Hospital Stay (HOSPITAL_BASED_OUTPATIENT_CLINIC_OR_DEPARTMENT_OTHER)
Admission: EM | Admit: 2023-06-05 | Discharge: 2023-06-10 | DRG: 280 | Disposition: A | Discharge status: Home / Self Care | Payer: 59 | Attending: Internal Medicine | Admitting: Internal Medicine

## 2023-06-05 DIAGNOSIS — I5023 Acute on chronic systolic (congestive) heart failure: Secondary | ICD-10-CM | POA: Diagnosis present

## 2023-06-05 DIAGNOSIS — Z794 Long term (current) use of insulin: Secondary | ICD-10-CM

## 2023-06-05 DIAGNOSIS — E1022 Type 1 diabetes mellitus with diabetic chronic kidney disease: Secondary | ICD-10-CM | POA: Diagnosis present

## 2023-06-05 DIAGNOSIS — Z91018 Allergy to other foods: Secondary | ICD-10-CM

## 2023-06-05 DIAGNOSIS — F3181 Bipolar II disorder: Secondary | ICD-10-CM | POA: Diagnosis present

## 2023-06-05 DIAGNOSIS — R112 Nausea with vomiting, unspecified: Principal | ICD-10-CM

## 2023-06-05 DIAGNOSIS — G4733 Obstructive sleep apnea (adult) (pediatric): Secondary | ICD-10-CM | POA: Diagnosis present

## 2023-06-05 DIAGNOSIS — E875 Hyperkalemia: Secondary | ICD-10-CM | POA: Diagnosis present

## 2023-06-05 DIAGNOSIS — Z9102 Food additives allergy status: Secondary | ICD-10-CM

## 2023-06-05 DIAGNOSIS — Z886 Allergy status to analgesic agent status: Secondary | ICD-10-CM

## 2023-06-05 DIAGNOSIS — N186 End stage renal disease: Secondary | ICD-10-CM | POA: Diagnosis present

## 2023-06-05 DIAGNOSIS — I21A1 Myocardial infarction type 2: Secondary | ICD-10-CM | POA: Diagnosis present

## 2023-06-05 DIAGNOSIS — Z87891 Personal history of nicotine dependence: Secondary | ICD-10-CM

## 2023-06-05 DIAGNOSIS — Z992 Dependence on renal dialysis: Secondary | ICD-10-CM

## 2023-06-05 DIAGNOSIS — Z79899 Other long term (current) drug therapy: Secondary | ICD-10-CM

## 2023-06-05 DIAGNOSIS — D631 Anemia in chronic kidney disease: Secondary | ICD-10-CM | POA: Diagnosis present

## 2023-06-05 DIAGNOSIS — R1084 Generalized abdominal pain: Secondary | ICD-10-CM

## 2023-06-05 DIAGNOSIS — E1065 Type 1 diabetes mellitus with hyperglycemia: Secondary | ICD-10-CM | POA: Diagnosis present

## 2023-06-05 DIAGNOSIS — R9431 Abnormal electrocardiogram [ECG] [EKG]: Secondary | ICD-10-CM | POA: Diagnosis present

## 2023-06-05 DIAGNOSIS — Z9641 Presence of insulin pump (external) (internal): Secondary | ICD-10-CM | POA: Diagnosis present

## 2023-06-05 DIAGNOSIS — E785 Hyperlipidemia, unspecified: Secondary | ICD-10-CM | POA: Diagnosis present

## 2023-06-05 DIAGNOSIS — I3139 Other pericardial effusion (noninflammatory): Secondary | ICD-10-CM | POA: Diagnosis present

## 2023-06-05 DIAGNOSIS — K529 Noninfective gastroenteritis and colitis, unspecified: Secondary | ICD-10-CM

## 2023-06-05 DIAGNOSIS — R7989 Other specified abnormal findings of blood chemistry: Secondary | ICD-10-CM

## 2023-06-05 DIAGNOSIS — G8929 Other chronic pain: Secondary | ICD-10-CM | POA: Diagnosis present

## 2023-06-05 DIAGNOSIS — I82C11 Acute embolism and thrombosis of right internal jugular vein: Secondary | ICD-10-CM | POA: Diagnosis present

## 2023-06-05 DIAGNOSIS — Z7901 Long term (current) use of anticoagulants: Secondary | ICD-10-CM

## 2023-06-05 DIAGNOSIS — I132 Hypertensive heart and chronic kidney disease with heart failure and with stage 5 chronic kidney disease, or end stage renal disease: Principal | ICD-10-CM | POA: Diagnosis present

## 2023-06-05 DIAGNOSIS — M898X9 Other specified disorders of bone, unspecified site: Secondary | ICD-10-CM | POA: Diagnosis present

## 2023-06-05 DIAGNOSIS — B009 Herpesviral infection, unspecified: Secondary | ICD-10-CM | POA: Diagnosis present

## 2023-06-05 DIAGNOSIS — Z86718 Personal history of other venous thrombosis and embolism: Secondary | ICD-10-CM

## 2023-06-05 LAB — CBC
HCT: 40.5 % (ref 36.0–46.0)
Hemoglobin: 13 g/dL (ref 12.0–15.0)
MCH: 31.7 pg (ref 26.0–34.0)
MCHC: 32.1 g/dL (ref 30.0–36.0)
MCV: 98.8 fL (ref 80.0–100.0)
Platelets: 180 10*3/uL (ref 150–400)
RBC: 4.1 MIL/uL (ref 3.87–5.11)
RDW: 15.3 % (ref 11.5–15.5)
WBC: 3.6 10*3/uL — ABNORMAL LOW (ref 4.0–10.5)
nRBC: 0 % (ref 0.0–0.2)

## 2023-06-05 LAB — COMPREHENSIVE METABOLIC PANEL
ALT: 15 U/L (ref 0–44)
AST: 21 U/L (ref 15–41)
Albumin: 3.7 g/dL (ref 3.5–5.0)
Alkaline Phosphatase: 93 U/L (ref 38–126)
Anion gap: 11 (ref 5–15)
BUN: 39 mg/dL — ABNORMAL HIGH (ref 6–20)
CO2: 22 mmol/L (ref 22–32)
Calcium: 9.1 mg/dL (ref 8.9–10.3)
Chloride: 99 mmol/L (ref 98–111)
Creatinine, Ser: 5.95 mg/dL — ABNORMAL HIGH (ref 0.44–1.00)
GFR, Estimated: 9 mL/min — ABNORMAL LOW (ref >60)
Glucose, Bld: 316 mg/dL — ABNORMAL HIGH (ref 70–99)
Potassium: 5 mmol/L (ref 3.5–5.1)
Sodium: 132 mmol/L — ABNORMAL LOW (ref 135–145)
Total Bilirubin: 0.8 mg/dL (ref 0.3–1.2)
Total Protein: 7.4 g/dL (ref 6.5–8.1)

## 2023-06-05 LAB — CBG MONITORING, ED: Glucose-Capillary: 286 mg/dL — ABNORMAL HIGH (ref 70–99)

## 2023-06-05 LAB — HCG, QUANTITATIVE, PREGNANCY: hCG, Beta Chain, Quant, S: 2 m[IU]/mL (ref <5)

## 2023-06-05 LAB — TROPONIN I (HIGH SENSITIVITY)
Troponin I (High Sensitivity): 598 ng/L (ref <18)
Troponin I (High Sensitivity): 492 ng/L (ref <18)

## 2023-06-05 LAB — LIPASE, BLOOD: Lipase: 29 U/L (ref 11–51)

## 2023-06-05 MED ORDER — LORAZEPAM 2 MG/ML IJ SOLN
1.0000 mg | Freq: Once | INTRAMUSCULAR | Status: AC
  Administered 2023-06-05: 1 mg via INTRAVENOUS
  Filled 2023-06-05: qty 1

## 2023-06-05 MED ORDER — DROPERIDOL 2.5 MG/ML IJ SOLN
1.2500 mg | Freq: Once | INTRAMUSCULAR | Status: AC
  Administered 2023-06-05: 1.25 mg via INTRAVENOUS
  Filled 2023-06-05: qty 2

## 2023-06-05 MED ORDER — FENTANYL CITRATE PF 50 MCG/ML IJ SOSY
50.0000 ug | PREFILLED_SYRINGE | Freq: Once | INTRAMUSCULAR | Status: AC
  Administered 2023-06-05: 50 ug via INTRAVENOUS
  Filled 2023-06-05: qty 1

## 2023-06-05 NOTE — ED Triage Notes (Signed)
Patient arrives complaining of nausea vomiting and diarrhea. Patient is a dialysis patient and had the full treatment today. Upon arrival patient was slumped over in  the wheelchair. This nurse attempted to sternal rub the patient and she responded. Patient is now verbal and moving all extremities while in the room.

## 2023-06-05 NOTE — ED Provider Notes (Signed)
[place] [facility] Provider Note   CSN: [id number] Arrival date & time: 06/05/23  1957     History  Chief Complaint  Patient presents with   Nausea    Kathy Lewis is a 27 y.o. female.  HPI   27 year old female with medical history significant for type 1 DM, ESRD on HD TTS (previously on PD), HFrEF (EF 15-20%, 02/2023) 2/2 peripartum cardiomyopathy, HTN, HLD, bipolar 2 disorder, anxiety depression, OSA, cyclical nausea/vomiting and abdominal pain of unknown etiology who presents to the emergency department with uncontrolled nausea and vomiting with associated generalized abdominal pain and discomfort.  The patient states that she has had some focality of her pain to the left upper quadrant.  She had a recent hospitalization at [facility] on 10/3 for the same.  She initially endorses some episodes of diarrhea.  The patient underwent a full treatment course of dialysis today.  The patient was initially somnolent on arrival and was emergently bedded and was now verbal and moving all 4 extremities on my evaluation.  Home Medications Prior to Admission medications   Medication Sig Start Date End Date Taking? Authorizing Provider  acetaminophen (TYLENOL) 500 MG tablet Take 1,000 mg by mouth daily as needed for headache (pain).    [provider]  amitriptyline (ELAVIL) 50 MG tablet Take 1 tablet (50 mg total) by mouth at bedtime. 08/30/22   Shanna Cisco, NP  atorvastatin (LIPITOR) 40 MG tablet Take 1 tablet (40 mg total) by mouth every evening. 05/07/23   Bensimhon, Bevelyn Buckles, MD  B Complex-C-Folic Acid (RENA-VITE RX) 1 MG TABS Take 1 tablet by mouth daily. 01/01/23   [provider]  calcium acetate (PHOSLO) 667 MG capsule Take 1,334 mg by mouth 3 (three) times daily with meals. 09/28/22   [provider]  carvedilol (COREG) 25 MG tablet Take 1 tablet (25 mg total) by mouth 2 (two) times daily with a meal.  05/07/23   Bensimhon, Bevelyn Buckles, MD  clonazePAM (KLONOPIN) 0.5 MG tablet Take 1 tablet (0.5 mg total) by mouth 2 (two) times daily as needed for anxiety. 03/16/22   Leroy Sea, MD  diazepam (VALIUM) 2 MG tablet Take 1 tablet (2 mg total) by mouth every 6 (six) hours as needed (vomiting). 05/28/23   Arthor Captain, PA-C  doxycycline (VIBRAMYCIN) 100 MG capsule Take 1 capsule (100 mg total) by mouth 2 (two) times daily. One po bid x 7 days 05/26/23   Charlynne Pander, MD  ELIQUIS 2.5 MG TABS tablet Take 2.5 mg by mouth 2 (two) times daily.    [provider]  hydrALAZINE (APRESOLINE) 100 MG tablet Take 1 tablet (100 mg total) by mouth 2 (two) times daily. 05/07/23   Bensimhon, Bevelyn Buckles, MD  hydrOXYzine (ATARAX) 10 MG tablet Take 10 mg by mouth daily. Taken before dialysis on Tues, Thurs, Sat    [provider]  insulin lispro (HUMALOG) 100 UNIT/ML injection Inject 0.5 Units into the skin See admin instructions. 0.5 units per hour via insulin pump. 01/08/22   [provider]  isosorbide mononitrate (IMDUR) 30 MG 24 hr tablet Take 1 tablet (30 mg total) by mouth daily. 10/09/22 11/20/23  Arrien, York Ram, MD  magnesium oxide (MAG-OX) 400 MG tablet Take 2 tablets (800 mg total) by mouth daily. 05/07/23   Bensimhon, Bevelyn Buckles, MD  metoCLOPramide (REGLAN) 10 MG tablet Take 1 tablet (10 mg total) by mouth every 6 (six) hours  as needed for nausea (nausea/headache). 05/26/23   Charlynne Pander, MD  omeprazole (PRILOSEC) 20 MG capsule Take 20 mg by mouth daily. 04/11/23   [provider]  oxyCODONE (ROXICODONE) 5 MG immediate release tablet Take 1 tablet (5 mg total) by mouth every 4 (four) hours as needed for severe pain. 05/26/23   Charlynne Pander, MD  sacubitril-valsartan (ENTRESTO) 24-26 MG Take 1 tablet by mouth 2 (two) times daily. 05/07/23   Bensimhon, Bevelyn Buckles, MD  SUMAtriptan (IMITREX) 50 MG tablet Take 50 mg by mouth daily as needed for headache.    [provider]  valACYclovir (VALTREX) 500 MG tablet Take 500 mg by mouth daily as needed (cold sores). 04/17/22   [provider]  Vitamin D, Ergocalciferol, (DRISDOL) 1.25 MG (50000 UNIT) CAPS capsule Take 50,000 Units by mouth every Sunday.    [provider]  escitalopram (LEXAPRO) 10 MG tablet Take 20 mg by mouth daily.  05/10/20 10/06/20  [provider]  furosemide (LASIX) 40 MG tablet Take 1 tablet (40 mg total) by mouth daily. Patient not taking: Reported on 02/16/2021 10/28/19 02/17/21  Geryl Rankins, MD  lisinopril (ZESTRIL) 10 MG tablet Take 10 mg by mouth daily.  02/23/20 10/06/20  [provider]  spironolactone (ALDACTONE) 25 MG tablet Take 25 mg by mouth daily.  04/20/20 10/06/20  [provider]      Allergies    Cantaloupe extract allergy skin test, Citrullus vulgaris, Food, Nsaids, and Strawberry extract    Review of Systems   Review of Systems  Unable to perform ROS: Acuity of condition    Physical Exam Updated Vital Signs BP 104/78   Pulse 79   Temp 97.6 F (36.4 C)   Resp 12   Ht 5' (1.524 m)   Wt 54.4 kg   LMP  (LMP Unknown)   SpO2 96%   BMI 23.44 kg/m  Physical Exam Vitals and nursing note reviewed.  Constitutional:      General: She is in acute distress.     Appearance: She is well-developed.     Comments: Actively retching  HENT:     Head: Normocephalic and atraumatic.  Eyes:     Conjunctiva/sclera: Conjunctivae normal.  Cardiovascular:     Rate and Rhythm: Normal rate and regular rhythm.  Pulmonary:     Effort: Pulmonary effort is normal. No respiratory distress.     Breath sounds: Normal breath sounds.  Abdominal:     Palpations: Abdomen is soft.     Tenderness: There is abdominal tenderness.     Comments: Generalized abdominal tenderness, some focality to the left upper quadrant  Musculoskeletal:        General: No swelling.     Cervical back: Neck supple.  Skin:    General: Skin is warm and dry.      Capillary Refill: Capillary refill takes less than 2 seconds.  Neurological:     Mental Status: She is alert.  Psychiatric:        Mood and Affect: Mood normal.     ED Results / Procedures / Treatments   Labs (all labs ordered are listed, but only abnormal results are displayed) Labs Reviewed  COMPREHENSIVE METABOLIC PANEL - Abnormal; Notable for the following components:      Result Value   Sodium 132 (*)    Glucose, Bld 316 (*)    BUN 39 (*)    Creatinine, Ser 5.95 (*)    GFR, Estimated 9 (*)  All other components within normal limits  CBC - Abnormal; Notable for the following components:   WBC 3.6 (*)    All other components within normal limits  CBG MONITORING, ED - Abnormal; Notable for the following components:   Glucose-Capillary 286 (*)    All other components within normal limits  TROPONIN I (HIGH SENSITIVITY) - Abnormal; Notable for the following components:   Troponin I (High Sensitivity) 598 (*)    All other components within normal limits  TROPONIN I (HIGH SENSITIVITY) - Abnormal; Notable for the following components:   Troponin I (High Sensitivity) 492 (*)    All other components within normal limits  LIPASE, BLOOD  HCG, QUANTITATIVE, PREGNANCY  URINALYSIS, ROUTINE W REFLEX MICROSCOPIC    EKG EKG Interpretation Date/Time:  Tuesday June 05 2023 20:40:06 EDT Ventricular Rate:  103 PR Interval:  160 QRS Duration:  108 QT Interval:  373 QTC Calculation: 489 R Axis:   201  Text Interpretation: Sinus tachycardia Biatrial enlargement Right axis deviation Minimal ST depression, lateral leads Borderline prolonged QT interval Confirmed by Ernie Avena ([id number]) on 06/05/2023 8:48:24 PM  Radiology CT ABDOMEN PELVIS WO CONTRAST  Result Date: 06/05/2023 CLINICAL DATA:  Abdomen pain nausea and diarrhea EXAM: CT ABDOMEN AND PELVIS WITHOUT CONTRAST TECHNIQUE: Multidetector CT imaging of the abdomen and pelvis was performed following the standard protocol without IV  contrast. RADIATION DOSE REDUCTION: This exam was performed according to the departmental dose-optimization program which includes automated exposure control, adjustment of the mA and/or kV according to patient size and/or use of iterative reconstruction technique. COMPARISON:  CT 05/26/2023 FINDINGS: Lower chest: Marked cardiomegaly with small pericardial effusion, maximum thickness of 2.2 cm along the right cardiac border. Diffuse hazy pulmonary density at the bases suggesting mild edema. Atelectasis at the left base. Hepatobiliary: No focal liver abnormality is seen. No gallstones, gallbladder wall thickening, or biliary dilatation. Pancreas: Unremarkable. No pancreatic ductal dilatation or surrounding inflammatory changes. Spleen: Normal in size without focal abnormality. Adrenals/Urinary Tract: Adrenal glands are unremarkable. Kidneys are normal, without renal calculi, focal lesion, or hydronephrosis. Bladder is unremarkable. Stomach/Bowel: The stomach is nonenlarged. No dilated small bowel. Radiopaque material in the colon. Mild wall thickening of the descending colon with surrounding stranding, consistent with mild colitis, coronal series 603, image 65 through 77. Vascular/Lymphatic: Mild renal artery and SMA calcifications. No aneurysm. No suspicious lymph nodes Reproductive: Uterus and bilateral adnexa are unremarkable. Other: No free air. Small volume free fluid within the pelvis and abdomen Musculoskeletal: No acute osseous abnormality IMPRESSION: 1. Mild wall thickening of the descending colon with surrounding stranding consistent with mild colitis of infectious or inflammatory etiology. 2. Marked cardiomegaly with small pericardial effusion. Hazy pulmonary density at the bases suggesting mild edema. Small volume free fluid within the abdomen and pelvis. Electronically Signed   By: Jasmine Pang M.D.   On: 06/05/2023 21:41    Procedures Procedures    Medications Ordered in ED Medications   LORazepam (ATIVAN) injection 1 mg (1 mg Intravenous Given 06/05/23 2031)  fentaNYL (SUBLIMAZE) injection 50 mcg (50 mcg Intravenous Given 06/05/23 2032)  droperidol (INAPSINE) 2.5 MG/ML injection 1.25 mg (1.25 mg Intravenous Given 06/05/23 2033)    ED Course/ Medical Decision Making/ A&P Clinical Course as of 06/06/23 0001  Tue Jun 05, 2023  2312 Troponin I (High Sensitivity)(!!): 598 [JL]    Clinical Course User Index [JL] Ernie Avena, MD  Medical Decision Making Amount and/or Complexity of Data Reviewed Labs: ordered. Decision-making details documented in ED Course. Radiology: ordered.  Risk Prescription drug management. Decision regarding hospitalization.    28 year old female with medical history significant for type 1 DM, ESRD on HD TTS (previously on PD), HFrEF (EF 15-20%, 02/2023) 2/2 peripartum cardiomyopathy, HTN, HLD, bipolar 2 disorder, anxiety depression, OSA, cyclical nausea/vomiting and abdominal pain of unknown etiology who presents to the emergency department with uncontrolled nausea and vomiting with associated generalized abdominal pain and discomfort.  The patient states that she has had some focality of her pain to the left upper quadrant.  She had a recent hospitalization at [facility] on 10/3 for the same.   On arrival, the patient was not in acute distress, complaining of severe nausea and vomiting and generalized abdominal pain that she describes as severe.  She was afebrile, tachycardic heart rate 105, hypertensive BP 178/116, saturating 100% on room air.  EKG: Sinus tachycardia, ventricular rate 103, minimal ST changes that were nonspecific, no STEMI.  Physical exam revealed generalized abdominal tenderness and the patient was actively retching.  IV access was obtained and the patient was administered IV Ativan, IV fentanyl for pain control and 1.25 mg of IV droperidol.    Laboratory evaluation revealed  a CBG of 286, hCG normal, CBC with a nonspecific leukopenia to 3.6, no anemia with a hemoglobin of 13, CMP with hyperglycemia to 316, no acidosis with a bicarbonate of 22, anion gap of 11, normal LFTs, potassium normal at 5 after her dialysis session today.  Lipase was also normal.  Initial cardiac troponin was found to be elevated at 598, repeat was downtrending to 492.  On repeat assessment, the patient was feeling symptomatically improved.  She denied any chest pain or shortness of breath.  CT abdomen pelvis without contrast revealed evidence of a small pericardial effusion with pulmonary edema present that was mild, intra-abdominal she was found to have mild pelvic free fluid and a colitis. IMPRESSION:  1. Mild wall thickening of the descending colon with surrounding  stranding consistent with mild colitis of infectious or inflammatory  etiology.  2. Marked cardiomegaly with small pericardial effusion. Hazy  pulmonary density at the bases suggesting mild edema. Small volume  free fluid within the abdomen and pelvis.   I discussed the care of the patient with on-call cardiology, Dr. Piedad Climes who recommended admission to the hospitalist for observation, evaluation by the advanced heart failure team in the morning.  The patient did have to be placed on oxygen briefly after fentanyl administration, does have evidence of mild pulmonary edema on CT imaging.  She remained hemodynamically stable with pain improved on repeat assessment.  The patient was admitted to the hospitalist service for observation with a plan for potential heart failure consultation in the morning, Dr. Nestor Lewandowsky accepting.   Final Clinical Impression(s) / ED Diagnoses Final diagnoses:  Nausea and vomiting, unspecified vomiting type  Generalized abdominal pain  Elevated troponin  Colitis    Rx / DC Orders ED Discharge Orders     None         Ernie Avena, MD 06/06/23 0000    Ernie Avena, MD 06/06/23 [id number]     Ernie Avena, MD 06/06/23 0030

## 2023-06-05 NOTE — ED Notes (Signed)
Placed patient on 2L [place] due to patient having a response to her receiving narcotics. Patient desat to 70% on RA. Post 2L [place] patient oxygen saturation is 96%. Will continue to monitor and make necessary changes. Patient tolerating well.

## 2023-06-05 NOTE — ED Notes (Signed)
Patient transported to CT 

## 2023-06-06 ENCOUNTER — Other Ambulatory Visit: Payer: Self-pay

## 2023-06-06 DIAGNOSIS — E1022 Type 1 diabetes mellitus with diabetic chronic kidney disease: Secondary | ICD-10-CM | POA: Diagnosis present

## 2023-06-06 DIAGNOSIS — Z79899 Other long term (current) drug therapy: Secondary | ICD-10-CM | POA: Diagnosis not present

## 2023-06-06 DIAGNOSIS — M898X9 Other specified disorders of bone, unspecified site: Secondary | ICD-10-CM | POA: Diagnosis present

## 2023-06-06 DIAGNOSIS — Z9641 Presence of insulin pump (external) (internal): Secondary | ICD-10-CM | POA: Diagnosis present

## 2023-06-06 DIAGNOSIS — G4733 Obstructive sleep apnea (adult) (pediatric): Secondary | ICD-10-CM | POA: Diagnosis present

## 2023-06-06 DIAGNOSIS — I3139 Other pericardial effusion (noninflammatory): Secondary | ICD-10-CM | POA: Diagnosis present

## 2023-06-06 DIAGNOSIS — Z794 Long term (current) use of insulin: Secondary | ICD-10-CM | POA: Diagnosis not present

## 2023-06-06 DIAGNOSIS — R748 Abnormal levels of other serum enzymes: Secondary | ICD-10-CM | POA: Diagnosis not present

## 2023-06-06 DIAGNOSIS — E785 Hyperlipidemia, unspecified: Secondary | ICD-10-CM | POA: Diagnosis present

## 2023-06-06 DIAGNOSIS — B009 Herpesviral infection, unspecified: Secondary | ICD-10-CM | POA: Diagnosis present

## 2023-06-06 DIAGNOSIS — Z992 Dependence on renal dialysis: Secondary | ICD-10-CM | POA: Diagnosis not present

## 2023-06-06 DIAGNOSIS — F3181 Bipolar II disorder: Secondary | ICD-10-CM | POA: Diagnosis present

## 2023-06-06 DIAGNOSIS — N186 End stage renal disease: Secondary | ICD-10-CM | POA: Diagnosis present

## 2023-06-06 DIAGNOSIS — D631 Anemia in chronic kidney disease: Secondary | ICD-10-CM | POA: Diagnosis present

## 2023-06-06 DIAGNOSIS — Z86718 Personal history of other venous thrombosis and embolism: Secondary | ICD-10-CM | POA: Diagnosis not present

## 2023-06-06 DIAGNOSIS — I5023 Acute on chronic systolic (congestive) heart failure: Secondary | ICD-10-CM | POA: Diagnosis present

## 2023-06-06 DIAGNOSIS — R9431 Abnormal electrocardiogram [ECG] [EKG]: Secondary | ICD-10-CM | POA: Diagnosis present

## 2023-06-06 DIAGNOSIS — I132 Hypertensive heart and chronic kidney disease with heart failure and with stage 5 chronic kidney disease, or end stage renal disease: Secondary | ICD-10-CM | POA: Diagnosis present

## 2023-06-06 DIAGNOSIS — R1013 Epigastric pain: Secondary | ICD-10-CM | POA: Diagnosis not present

## 2023-06-06 DIAGNOSIS — G8929 Other chronic pain: Secondary | ICD-10-CM | POA: Diagnosis present

## 2023-06-06 DIAGNOSIS — E875 Hyperkalemia: Secondary | ICD-10-CM | POA: Diagnosis present

## 2023-06-06 DIAGNOSIS — I10 Essential (primary) hypertension: Secondary | ICD-10-CM | POA: Diagnosis not present

## 2023-06-06 DIAGNOSIS — Z87891 Personal history of nicotine dependence: Secondary | ICD-10-CM | POA: Diagnosis not present

## 2023-06-06 DIAGNOSIS — I21A1 Myocardial infarction type 2: Secondary | ICD-10-CM | POA: Diagnosis present

## 2023-06-06 DIAGNOSIS — Z7901 Long term (current) use of anticoagulants: Secondary | ICD-10-CM | POA: Diagnosis not present

## 2023-06-06 DIAGNOSIS — E1065 Type 1 diabetes mellitus with hyperglycemia: Secondary | ICD-10-CM | POA: Diagnosis present

## 2023-06-06 DIAGNOSIS — I82C11 Acute embolism and thrombosis of right internal jugular vein: Secondary | ICD-10-CM | POA: Diagnosis present

## 2023-06-06 LAB — CBC WITH DIFFERENTIAL/PLATELET
Abs Immature Granulocytes: 0.07 10*3/uL (ref 0.00–0.07)
Basophils Absolute: 0.1 10*3/uL (ref 0.0–0.1)
Basophils Relative: 2 %
Eosinophils Absolute: 0.1 10*3/uL (ref 0.0–0.5)
Eosinophils Relative: 3 %
HCT: 39.8 % (ref 36.0–46.0)
Hemoglobin: 13.2 g/dL (ref 12.0–15.0)
Immature Granulocytes: 2 %
Lymphocytes Relative: 30 %
Lymphs Abs: 1 10*3/uL (ref 0.7–4.0)
MCH: 33.2 pg (ref 26.0–34.0)
MCHC: 33.2 g/dL (ref 30.0–36.0)
MCV: 100.3 fL — ABNORMAL HIGH (ref 80.0–100.0)
Monocytes Absolute: 0.4 10*3/uL (ref 0.1–1.0)
Monocytes Relative: 11 %
Neutro Abs: 1.8 10*3/uL (ref 1.7–7.7)
Neutrophils Relative %: 52 %
Platelets: 171 10*3/uL (ref 150–400)
RBC: 3.97 MIL/uL (ref 3.87–5.11)
RDW: 15.4 % (ref 11.5–15.5)
WBC: 3.4 10*3/uL — ABNORMAL LOW (ref 4.0–10.5)
nRBC: 0 % (ref 0.0–0.2)

## 2023-06-06 LAB — HEMOGLOBIN A1C
Hgb A1c MFr Bld: 9 % — ABNORMAL HIGH (ref 4.8–5.6)
Mean Plasma Glucose: 211.6 mg/dL

## 2023-06-06 LAB — COMPREHENSIVE METABOLIC PANEL
ALT: 14 U/L (ref 0–44)
AST: 24 U/L (ref 15–41)
Albumin: 3.3 g/dL — ABNORMAL LOW (ref 3.5–5.0)
Alkaline Phosphatase: 84 U/L (ref 38–126)
Anion gap: 14 (ref 5–15)
BUN: 50 mg/dL — ABNORMAL HIGH (ref 6–20)
CO2: 18 mmol/L — ABNORMAL LOW (ref 22–32)
Calcium: 9.4 mg/dL (ref 8.9–10.3)
Chloride: 103 mmol/L (ref 98–111)
Creatinine, Ser: 7.6 mg/dL — ABNORMAL HIGH (ref 0.44–1.00)
GFR, Estimated: 7 mL/min — ABNORMAL LOW (ref >60)
Glucose, Bld: 332 mg/dL — ABNORMAL HIGH (ref 70–99)
Potassium: 6.1 mmol/L — ABNORMAL HIGH (ref 3.5–5.1)
Sodium: 135 mmol/L (ref 135–145)
Total Bilirubin: 1.1 mg/dL (ref 0.3–1.2)
Total Protein: 6.9 g/dL (ref 6.5–8.1)

## 2023-06-06 LAB — BASIC METABOLIC PANEL
Anion gap: 13 (ref 5–15)
BUN: 50 mg/dL — ABNORMAL HIGH (ref 6–20)
CO2: 21 mmol/L — ABNORMAL LOW (ref 22–32)
Calcium: 9 mg/dL (ref 8.9–10.3)
Chloride: 98 mmol/L (ref 98–111)
Creatinine, Ser: 7.87 mg/dL — ABNORMAL HIGH (ref 0.44–1.00)
GFR, Estimated: 7 mL/min — ABNORMAL LOW (ref >60)
Glucose, Bld: 479 mg/dL — ABNORMAL HIGH (ref 70–99)
Potassium: 5.3 mmol/L — ABNORMAL HIGH (ref 3.5–5.1)
Sodium: 132 mmol/L — ABNORMAL LOW (ref 135–145)

## 2023-06-06 LAB — TROPONIN I (HIGH SENSITIVITY)
Troponin I (High Sensitivity): 438 ng/L (ref <18)
Troponin I (High Sensitivity): 383 ng/L (ref <18)

## 2023-06-06 LAB — PHOSPHORUS: Phosphorus: 5 mg/dL — ABNORMAL HIGH (ref 2.5–4.6)

## 2023-06-06 LAB — MAGNESIUM: Magnesium: 2.5 mg/dL — ABNORMAL HIGH (ref 1.7–2.4)

## 2023-06-06 LAB — CBG MONITORING, ED: Glucose-Capillary: 217 mg/dL — ABNORMAL HIGH (ref 70–99)

## 2023-06-06 LAB — GLUCOSE, CAPILLARY
Glucose-Capillary: 264 mg/dL — ABNORMAL HIGH (ref 70–99)
Glucose-Capillary: 370 mg/dL — ABNORMAL HIGH (ref 70–99)
Glucose-Capillary: 465 mg/dL — ABNORMAL HIGH (ref 70–99)
Glucose-Capillary: 419 mg/dL — ABNORMAL HIGH (ref 70–99)

## 2023-06-06 MED ORDER — CARVEDILOL 25 MG PO TABS
25.0000 mg | ORAL_TABLET | Freq: Two times a day (BID) | ORAL | Status: DC
  Administered 2023-06-06 – 2023-06-10 (×8): 25 mg via ORAL
  Filled 2023-06-06 (×8): qty 1

## 2023-06-06 MED ORDER — HYDRALAZINE HCL 50 MG PO TABS
100.0000 mg | ORAL_TABLET | Freq: Two times a day (BID) | ORAL | Status: DC
  Administered 2023-06-06 – 2023-06-10 (×7): 100 mg via ORAL
  Filled 2023-06-06 (×11): qty 2

## 2023-06-06 MED ORDER — FENTANYL CITRATE PF 50 MCG/ML IJ SOSY
25.0000 ug | PREFILLED_SYRINGE | INTRAMUSCULAR | Status: DC | PRN
  Administered 2023-06-06 – 2023-06-09 (×12): 25 ug via INTRAVENOUS
  Filled 2023-06-06 (×13): qty 1

## 2023-06-06 MED ORDER — SODIUM BICARBONATE 8.4 % IV SOLN
50.0000 meq | Freq: Once | INTRAVENOUS | Status: AC
  Administered 2023-06-06: 50 meq via INTRAVENOUS
  Filled 2023-06-06: qty 50

## 2023-06-06 MED ORDER — INSULIN ASPART 100 UNIT/ML IJ SOLN: 2.0000 [IU] | Freq: Three times a day (TID) | INTRAMUSCULAR | Status: DC

## 2023-06-06 MED ORDER — HEPARIN SODIUM (PORCINE) 5000 UNIT/ML IJ SOLN
5000.0000 [IU] | Freq: Three times a day (TID) | INTRAMUSCULAR | Status: DC
  Administered 2023-06-06: 5000 [IU] via SUBCUTANEOUS
  Filled 2023-06-06 (×2): qty 1

## 2023-06-06 MED ORDER — CALCIUM GLUCONATE-NACL 1-0.675 GM/50ML-% IV SOLN
1.0000 g | Freq: Once | INTRAVENOUS | Status: AC
  Administered 2023-06-06: 1000 mg via INTRAVENOUS
  Filled 2023-06-06: qty 50

## 2023-06-06 MED ORDER — HYDROXYZINE HCL 10 MG PO TABS
10.0000 mg | ORAL_TABLET | Freq: Every day | ORAL | Status: DC
  Administered 2023-06-06 – 2023-06-09 (×4): 10 mg via ORAL
  Filled 2023-06-06 (×4): qty 1

## 2023-06-06 MED ORDER — INSULIN PUMP
SUBCUTANEOUS | Status: DC
  Administered 2023-06-07: 4 via SUBCUTANEOUS
  Administered 2023-06-08: 0.9 via SUBCUTANEOUS
  Administered 2023-06-08: 1.95 via SUBCUTANEOUS
  Administered 2023-06-08: 3.35 via SUBCUTANEOUS
  Administered 2023-06-08: 2.4 via SUBCUTANEOUS
  Administered 2023-06-08: 2.2 via SUBCUTANEOUS
  Administered 2023-06-09: 2 via SUBCUTANEOUS
  Administered 2023-06-10: 4.75 via SUBCUTANEOUS
  Filled 2023-06-06: qty 1

## 2023-06-06 MED ORDER — INSULIN ASPART 100 UNIT/ML IV SOLN
10.0000 [IU] | Freq: Once | INTRAVENOUS | Status: AC
  Administered 2023-06-06: 10 [IU] via INTRAVENOUS

## 2023-06-06 MED ORDER — SUMATRIPTAN SUCCINATE 50 MG PO TABS: 50.0000 mg | ORAL_TABLET | Freq: Every day | ORAL | Status: DC | PRN

## 2023-06-06 MED ORDER — ISOSORBIDE MONONITRATE ER 30 MG PO TB24
30.0000 mg | ORAL_TABLET | Freq: Every day | ORAL | Status: DC
  Administered 2023-06-06 – 2023-06-09 (×4): 30 mg via ORAL
  Filled 2023-06-06 (×4): qty 1

## 2023-06-06 MED ORDER — FENTANYL CITRATE PF 50 MCG/ML IJ SOSY
50.0000 ug | PREFILLED_SYRINGE | Freq: Once | INTRAMUSCULAR | Status: AC
  Administered 2023-06-06: 50 ug via INTRAVENOUS
  Filled 2023-06-06: qty 1

## 2023-06-06 MED ORDER — INSULIN ASPART 100 UNIT/ML IJ SOLN: 0.0000 [IU] | Freq: Three times a day (TID) | INTRAMUSCULAR | Status: DC

## 2023-06-06 MED ORDER — OXYCODONE HCL 5 MG PO TABS
5.0000 mg | ORAL_TABLET | ORAL | Status: DC | PRN
  Administered 2023-06-07 – 2023-06-09 (×2): 5 mg via ORAL
  Filled 2023-06-06 (×5): qty 1

## 2023-06-06 MED ORDER — INSULIN ASPART 100 UNIT/ML IJ SOLN
2.0000 [IU] | Freq: Three times a day (TID) | INTRAMUSCULAR | Status: DC
  Administered 2023-06-06 – 2023-06-07 (×3): 2 [IU] via SUBCUTANEOUS

## 2023-06-06 MED ORDER — ALBUTEROL SULFATE (2.5 MG/3ML) 0.083% IN NEBU: 2.5000 mg | INHALATION_SOLUTION | RESPIRATORY_TRACT | Status: DC | PRN

## 2023-06-06 MED ORDER — SODIUM ZIRCONIUM CYCLOSILICATE 10 G PO PACK
10.0000 g | PACK | Freq: Once | ORAL | Status: AC
  Administered 2023-06-06: 10 g via ORAL
  Filled 2023-06-06: qty 1

## 2023-06-06 MED ORDER — ACETAMINOPHEN 500 MG PO TABS
1000.0000 mg | ORAL_TABLET | Freq: Every day | ORAL | Status: DC | PRN
  Filled 2023-06-06 (×2): qty 2

## 2023-06-06 MED ORDER — AMITRIPTYLINE HCL 25 MG PO TABS
50.0000 mg | ORAL_TABLET | Freq: Every day | ORAL | Status: DC
  Administered 2023-06-06 – 2023-06-10 (×4): 50 mg via ORAL
  Filled 2023-06-06 (×3): qty 2

## 2023-06-06 MED ORDER — ATORVASTATIN CALCIUM 40 MG PO TABS
40.0000 mg | ORAL_TABLET | Freq: Every evening | ORAL | Status: DC
  Administered 2023-06-06 – 2023-06-09 (×4): 40 mg via ORAL
  Filled 2023-06-06 (×4): qty 1

## 2023-06-06 MED ORDER — SACUBITRIL-VALSARTAN 24-26 MG PO TABS
1.0000 | ORAL_TABLET | Freq: Two times a day (BID) | ORAL | Status: DC
  Administered 2023-06-06 – 2023-06-07 (×2): 1 via ORAL
  Filled 2023-06-06 (×3): qty 1

## 2023-06-06 MED ORDER — INSULIN ASPART 100 UNIT/ML IJ SOLN
0.0000 [IU] | Freq: Three times a day (TID) | INTRAMUSCULAR | Status: DC
  Administered 2023-06-06: 6 [IU] via SUBCUTANEOUS
  Administered 2023-06-07: 2 [IU] via SUBCUTANEOUS
  Administered 2023-06-07: 3 [IU] via SUBCUTANEOUS

## 2023-06-06 MED ORDER — BUTALBITAL-APAP-CAFFEINE 50-325-40 MG PO TABS: 1.0000 | ORAL_TABLET | Freq: Four times a day (QID) | ORAL | Status: DC | PRN

## 2023-06-06 NOTE — Inpatient Diabetes Management (Signed)
Inpatient Diabetes Program Recommendations  AACE/ADA: New Consensus Statement on Inpatient Glycemic Control (2015)  Target Ranges:  Prepandial:   less than 140 mg/dL      Peak postprandial:   less than 180 mg/dL (1-2 hours)      Critically ill patients:  140 - 180 mg/dL   Lab Results  Component Value Date   GLUCAP 217 (H) 06/06/2023   HGBA1C 9.3 (H) 02/20/2023    Review of Glycemic Control  Latest Reference Range & Units 06/05/23 20:20 06/06/23 12:49  Glucose-Capillary 70 - 99 mg/dL 308 (H) 657 (H)  (H): Data is abnormally high Diabetes history: DM1(does not make insulin.  Needs correction, basal and meal coverage)   Outpatient Diabetes medications:  OmniPod insulin pump with Dexcom CGM; insulin pump settings (0.5 units/hour (total basal 12 units per day), 1 unit per 8 grams carbs, 1 unit drops glucose 50 mg/dl)    Current orders for Inpatient glycemic control: none   Inpatient Diabetes Program Recommendations:    Spoke with patient to verify insulin pump application. Currently, has Omnipod placed and is administering 0.5 units/hr. Unfortunately, she does not have all supplies at bedside to manage bolusing or correction. Her father is planning to obtain supplies this evening and bring to her. Discussed possible plan with patient and importance of obtaining supplies if she is wanting to continue pump use while inpatient. Patient in agreement.   Consider: Insulin pump order set Q4H Novolog 0-6 units Q4H Secure chat sent to MD. Will continue to follow while inpatient.  Thanks, Lujean Rave, MSN, RNC-OB Diabetes Coordinator [phone] (8a-5p)

## 2023-06-06 NOTE — ED Notes (Signed)
Patient declined to continue to wear nasal cannula.

## 2023-06-06 NOTE — ED Notes (Signed)
Pt requested CBG be measured. RN aware.

## 2023-06-06 NOTE — Progress Notes (Signed)
Patient refused to get her Heparin, MD aware

## 2023-06-06 NOTE — ED Notes (Signed)
Pt requested call Larita Fife, father, when pt departs for [facility].

## 2023-06-06 NOTE — H&P (Addendum)
History and Physical    Kathy Lewis ONG:[id number] DOB: 1996/02/08 DOA: 06/05/2023  PCP: Loura Pardon, PA  Patient coming from:  [facility]  I have personally briefly reviewed patient's old medical records in [facility]  Chief Complaint: abdominal pain, n/v/d  HPI: Kathy Lewis is a 27 y.o. female with medical history significant of   ESRD TTS, type 1 diabetes mellitus on insulin drip, and chronic systolic heart failure with most recent LVEF 15 to 20%, bipolar d/o who presented to [facility] ED complaining of N/V and abdominal pain, which started during her hemodialysis session.  Patient states she has had intermittent  abdominal pain x 1 year . Patient had EGD/Colo 03/2022 which were normal. Gastric emptying study was also normal. GI suspected cyclical abd pain/N/V and possible element of abdominal congestion from heart failure. Her primary nephrologist was also suspicious symptoms could be partially related to uremia and it was suggested that patient may require more frequent dialysis. Patient states today her pain was very severe and after HD she decided to present to ED. Patient notes no associated chest pain but at times notes sob. She notes at time [place] is worse with eating. She notes when her flare becomes severe her home medications are ineffective. She notes that fentanyl and dilaudid usually helps relieve pain. She notes she has had  stress test completed at [facility] in the spring. Of note on today's visit to the ED her symptoms were relieved with single doses of droperidol, fentanyl, and Ativan.  However due to elevated cardiac enzyme cardiology Dr. Piedad Climes,  was consulted and recommend admission to hospitalist service for further evaluation.  ED Course:  Vitals afeb, bp 178/116 repeat 142/79, hr 105, rr 24 sat 100%on ra    Labs were notable for initial troponin 598, with repeat value trending down to 492.   EKG -snr  st depression lateral leads   CT abdomen/pelvis  1. Mild wall  thickening of the descending colon with surrounding stranding consistent with mild colitis of infectious or inflammatory etiology. 2. Marked cardiomegaly with small pericardial effusion. Hazy pulmonary density at the bases suggesting mild edema. Small volume free fluid within the abdomen and pelvis.  Env 3.5, hgb 13, plt 180  Lipase 29  Na 132, K5, cl 99, glu 316, cr 5.95 Ce 598, 492, 438   Repeat labs  Na 135, K 6.1, cl 103, bicarb 18, glu 332, cr 7.6, AG 14  Review of Systems: As per HPI otherwise 10 point review of systems negative.   Past Medical History:  Diagnosis Date   Anemia    Anxiety    Bipolar 2 disorder (HCC)    Chronic kidney disease    Chronic systolic (congestive) heart failure (HCC)    Depression    DKA (diabetic ketoacidoses)    ESRD on peritoneal dialysis (HCC)    HSV infection    on valtrex   Hypokalemia    Leukocytosis    Migraine    Noncompliance with medication regimen    Preeclampsia    Prolonged QT syndrome    Severe anemia    Type 1 diabetes mellitus (HCC)     Past Surgical History:  Procedure Laterality Date   BIOPSY  04/24/2022   Procedure: BIOPSY;  Surgeon: Jenel Lucks, MD;  Location: [facility] ENDOSCOPY;  Service: Gastroenterology;;   CARDIAC CATHETERIZATION     COLONOSCOPY WITH PROPOFOL N/A 04/24/2022   Procedure: COLONOSCOPY WITH PROPOFOL;  Surgeon: Jenel Lucks, MD;  Location: [facility] ENDOSCOPY;  Service: Gastroenterology;  Laterality: N/A;   DILATION AND EVACUATION N/A 10/22/2019   Procedure: ULTRASOUND GUIDED DILATATION AND EVACUATION;  Surgeon: Geryl Rankins, MD;  Location: [facility] LD ORS;  Service: Gynecology;  Laterality: N/A;   ESOPHAGOGASTRODUODENOSCOPY (EGD) WITH PROPOFOL N/A 04/24/2022   Procedure: ESOPHAGOGASTRODUODENOSCOPY (EGD) WITH PROPOFOL;  Surgeon: Jenel Lucks, MD;  Location: [facility] ENDOSCOPY;  Service: Gastroenterology;  Laterality: N/A;   IR FLUORO GUIDE CV LINE RIGHT  09/14/2022   IR US GUIDE VASC ACCESS RIGHT   09/14/2022   peritoneal dialysis catheter insertion     RENAL BIOPSY       reports that she has quit smoking. Her smoking use included cigars and cigarettes. She has a 2 pack-year smoking history. She has never used smokeless tobacco. She reports that she does not drink alcohol and does not use drugs.  Allergies  Allergen Reactions   Cantaloupe Extract Allergy Skin Test Itching    Mouth itching     Citrullus Vulgaris Itching and Other (See Comments)    Makes mouth itch, ALL melons     Food Itching    All melon - mouth itching   Strawberry Extract Itching    Mouth itching   Nsaids Itching and Other (See Comments)    Avoid per nephrology    Family History  Adopted: Yes  Problem Relation Age of Onset   Heart disease Neg Hx     Prior to Admission medications   Medication Sig Start Date End Date Taking? Authorizing Provider  acetaminophen (TYLENOL) 500 MG tablet Take 1,000 mg by mouth daily as needed for headache (pain).   Yes [provider]  amitriptyline (ELAVIL) 50 MG tablet Take 1 tablet (50 mg total) by mouth at bedtime. 08/30/22  Yes Toy Cookey E, NP  atorvastatin (LIPITOR) 40 MG tablet Take 1 tablet (40 mg total) by mouth every evening. 05/07/23  Yes Bensimhon, Bevelyn Buckles, MD  B Complex-C-Folic Acid (RENA-VITE RX) 1 MG TABS Take 1 tablet by mouth daily. 01/01/23  Yes [provider]  butalbital-acetaminophen-caffeine (FIORICET) 50-325-40 MG tablet Take 1 tablet by mouth every 6 (six) hours as needed. 11/15/21  Yes [provider]  calcium acetate (PHOSLO) 667 MG capsule Take 1,334 mg by mouth 3 (three) times daily with meals. 09/28/22  Yes [provider]  carvedilol (COREG) 25 MG tablet Take 1 tablet (25 mg total) by mouth 2 (two) times daily with a meal. 05/07/23  Yes Bensimhon, Bevelyn Buckles, MD  clonazePAM (KLONOPIN) 0.5 MG tablet Take 1 tablet (0.5 mg total) by mouth 2 (two) times daily as needed for anxiety. Patient taking differently: Take  0.5 mg by mouth at bedtime. 03/16/22  Yes Leroy Sea, MD  diazepam (VALIUM) 2 MG tablet Take 1 tablet (2 mg total) by mouth every 6 (six) hours as needed (vomiting). 05/28/23  Yes Harris, Abigail, PA-C  ELIQUIS 2.5 MG TABS tablet Take 2.5 mg by mouth 2 (two) times daily.   Yes [provider]  hydrALAZINE (APRESOLINE) 100 MG tablet Take 1 tablet (100 mg total) by mouth 2 (two) times daily. 05/07/23  Yes Bensimhon, Bevelyn Buckles, MD  hydrOXYzine (ATARAX) 10 MG tablet Take 10 mg by mouth daily. Also taken before dialysis on Tues, Thurs, Sat.   Yes [provider]  insulin lispro (HUMALOG) 100 UNIT/ML injection Inject 0.5 Units into the skin See admin instructions. 0.5 units per hour via insulin pump. 01/08/22  Yes [provider]  isosorbide mononitrate (IMDUR) 30 MG 24 hr  tablet Take 1 tablet (30 mg total) by mouth daily. 10/09/22 11/20/23 Yes Arrien, York Ram, MD  magnesium oxide (MAG-OX) 400 MG tablet Take 2 tablets (800 mg total) by mouth daily. 05/07/23  Yes Bensimhon, Bevelyn Buckles, MD  methocarbamol (ROBAXIN) 500 MG tablet Take 500 mg by mouth as needed for muscle spasms.   Yes [provider]  omeprazole (PRILOSEC) 20 MG capsule Take 20 mg by mouth daily. 04/11/23  Yes [provider]  oxyCODONE (ROXICODONE) 5 MG immediate release tablet Take 1 tablet (5 mg total) by mouth every 4 (four) hours as needed for severe pain. 05/26/23  Yes Charlynne Pander, MD  sacubitril-valsartan (ENTRESTO) 24-26 MG Take 1 tablet by mouth 2 (two) times daily. 05/07/23  Yes Bensimhon, Bevelyn Buckles, MD  SUMAtriptan (IMITREX) 50 MG tablet Take 50 mg by mouth daily as needed for headache.   Yes [provider]  valACYclovir (VALTREX) 500 MG tablet Take 500 mg by mouth daily as needed (cold sores). 04/17/22  Yes [provider]  Vitamin D, Ergocalciferol, (DRISDOL) 1.25 MG (50000 UNIT) CAPS capsule Take 50,000 Units by mouth every Sunday.   Yes [provider]   doxycycline (VIBRAMYCIN) 100 MG capsule Take 1 capsule (100 mg total) by mouth 2 (two) times daily. One po bid x 7 days Patient not taking: Reported on 06/06/2023 05/26/23   Charlynne Pander, MD  metoCLOPramide (REGLAN) 10 MG tablet Take 1 tablet (10 mg total) by mouth every 6 (six) hours as needed for nausea (nausea/headache). Patient not taking: Reported on 06/06/2023 05/26/23   Charlynne Pander, MD  escitalopram (LEXAPRO) 10 MG tablet Take 20 mg by mouth daily.  05/10/20 10/06/20  [provider]  furosemide (LASIX) 40 MG tablet Take 1 tablet (40 mg total) by mouth daily. Patient not taking: Reported on 02/16/2021 10/28/19 02/17/21  Geryl Rankins, MD  lisinopril (ZESTRIL) 10 MG tablet Take 10 mg by mouth daily.  02/23/20 10/06/20  [provider]  spironolactone (ALDACTONE) 25 MG tablet Take 25 mg by mouth daily.  04/20/20 10/06/20  [provider]    Physical Exam: Vitals:   06/06/23 1230 06/06/23 1245 06/06/23 1300 06/06/23 1416  BP:   (!) 136/106 (!) 132/107  Pulse: 98 93 91 90  Resp: 20 14 17 18   Temp: 98.2 F (36.8 C)   98.4 F (36.9 C)  TempSrc: Oral   Oral  SpO2: 99% 100% 98% 100%  Weight:      Height:        Constitutional: NAD, calm, comfortable Vitals:   06/06/23 1230 06/06/23 1245 06/06/23 1300 06/06/23 1416  BP:   (!) 136/106 (!) 132/107  Pulse: 98 93 91 90  Resp: 20 14 17 18   Temp: 98.2 F (36.8 C)   98.4 F (36.9 C)  TempSrc: Oral   Oral  SpO2: 99% 100% 98% 100%  Weight:      Height:       Eyes: PERRL, lids and conjunctivae normal ENMT: Mucous membranes are moist. Posterior pharynx clear of any exudate or lesions.Normal dentition.  Neck: normal, supple, no masses, no thyromegaly Respiratory: clear to auscultation bilaterally, no wheezing, no crackles. Normal respiratory effort. No accessory muscle use.  Cardiovascular: Regular rate and rhythm, no murmurs / rubs / gallops. No extremity edema. 2+ pedal pulses. No carotid bruits.  Abdomen:  epigastric tenderness, no masses palpated. No hepatosplenomegaly. Bowel sounds positive.  Musculoskeletal: no clubbing / cyanosis. No joint deformity upper and lower extremities. Good ROM,  no contractures. Normal muscle tone.  Skin: no rashes, lesions, ulcers. No induration Neurologic: CN 2-12 grossly intact. Sensation intact, . Strength 5/5 in all 4.  Psychiatric: Normal judgment and insight. Alert and oriented x 3. Normal mood.    Labs on Admission: I have personally reviewed following labs and imaging studies  CBC: Recent Labs  Lab 06/05/23 2014  WBC 3.6*  HGB 13.0  HCT 40.5  MCV 98.8  PLT 180   Basic Metabolic Panel: Recent Labs  Lab 06/05/23 2014  NA 132*  K 5.0  CL 99  CO2 22  GLUCOSE 316*  BUN 39*  CREATININE 5.95*  CALCIUM 9.1   GFR: Estimated Creatinine Clearance: 10.2 mL/min (A) (by C-G formula based on SCr of 5.95 mg/dL (H)). Liver Function Tests: Recent Labs  Lab 06/05/23 2014  AST 21  ALT 15  ALKPHOS 93  BILITOT 0.8  PROT 7.4  ALBUMIN 3.7   Recent Labs  Lab 06/05/23 2014  LIPASE 29   No results for input(s): "AMMONIA" in the last 168 hours. Coagulation Profile: No results for input(s): "INR", "PROTIME" in the last 168 hours. Cardiac Enzymes: No results for input(s): "CKTOTAL", "CKMB", "CKMBINDEX", "TROPONINI" in the last 168 hours. BNP (last 3 results) No results for input(s): "PROBNP" in the last 8760 hours. HbA1C: No results for input(s): "HGBA1C" in the last 72 hours. CBG: Recent Labs  Lab 06/05/23 2020 06/06/23 1249  GLUCAP 286* 217*   Lipid Profile: No results for input(s): "CHOL", "HDL", "LDLCALC", "TRIG", "CHOLHDL", "LDLDIRECT" in the last 72 hours. Thyroid Function Tests: No results for input(s): "TSH", "T4TOTAL", "FREET4", "T3FREE", "THYROIDAB" in the last 72 hours. Anemia Panel: No results for input(s): "VITAMINB12", "FOLATE", "FERRITIN", "TIBC", "IRON", "RETICCTPCT" in the last 72 hours. Urine analysis:    Component  Value Date/Time   COLORURINE YELLOW 05/28/2023 1440   APPEARANCEUR HAZY (A) 05/28/2023 1440   LABSPEC 1.013 05/28/2023 1440   PHURINE 5.0 05/28/2023 1440   GLUCOSEU >=500 (A) 05/28/2023 1440   HGBUR SMALL (A) 05/28/2023 1440   BILIRUBINUR NEGATIVE 05/28/2023 1440   KETONESUR NEGATIVE 05/28/2023 1440   PROTEINUR >=300 (A) 05/28/2023 1440   UROBILINOGEN 0.2 12/15/2015 1736   NITRITE NEGATIVE 05/28/2023 1440   LEUKOCYTESUR NEGATIVE 05/28/2023 1440    Radiological Exams on Admission: CT ABDOMEN PELVIS WO CONTRAST  Result Date: 06/05/2023 CLINICAL DATA:  Abdomen pain nausea and diarrhea EXAM: CT ABDOMEN AND PELVIS WITHOUT CONTRAST TECHNIQUE: Multidetector CT imaging of the abdomen and pelvis was performed following the standard protocol without IV contrast. RADIATION DOSE REDUCTION: This exam was performed according to the departmental dose-optimization program which includes automated exposure control, adjustment of the mA and/or kV according to patient size and/or use of iterative reconstruction technique. COMPARISON:  CT 05/26/2023 FINDINGS: Lower chest: Marked cardiomegaly with small pericardial effusion, maximum thickness of 2.2 cm along the right cardiac border. Diffuse hazy pulmonary density at the bases suggesting mild edema. Atelectasis at the left base. Hepatobiliary: No focal liver abnormality is seen. No gallstones, gallbladder wall thickening, or biliary dilatation. Pancreas: Unremarkable. No pancreatic ductal dilatation or surrounding inflammatory changes. Spleen: Normal in size without focal abnormality. Adrenals/Urinary Tract: Adrenal glands are unremarkable. Kidneys are normal, without renal calculi, focal lesion, or hydronephrosis. Bladder is unremarkable. Stomach/Bowel: The stomach is nonenlarged. No dilated small bowel. Radiopaque material in the colon. Mild wall thickening of the descending colon with surrounding stranding, consistent with mild colitis, coronal series 603, image 65  through 77. Vascular/Lymphatic: Mild renal artery and SMA calcifications.  No aneurysm. No suspicious lymph nodes Reproductive: Uterus and bilateral adnexa are unremarkable. Other: No free air. Small volume free fluid within the pelvis and abdomen Musculoskeletal: No acute osseous abnormality IMPRESSION: 1. Mild wall thickening of the descending colon with surrounding stranding consistent with mild colitis of infectious or inflammatory etiology. 2. Marked cardiomegaly with small pericardial effusion. Hazy pulmonary density at the bases suggesting mild edema. Small volume free fluid within the abdomen and pelvis. Electronically Signed   By: Jasmine Pang M.D.   On: 06/05/2023 21:41    EKG: Independently reviewed. See above  Assessment/Plan  Abnormal CE with associated epigastric pain r/o type I vs type II  vs flare of chronic abdominal pain - insetting of periods of uncontrolled hypertension with lateral st depression - admit to progressive care  -trend CE -cardiology to see  -echo in am -no full dose AC rec by  cardiology at this time  -patient currently improved s/p treatment  -prn fentanyl    Mild Fluid overload  Probable combination Hfref exacerbation in setting of ESRD -HD for fluid management -nephrology consult in am  -resume GDMT/ hold spironolactone due to elevated K  Hyperkalemia  - place on lokelma protocol   CT scan -? Mild colitis  - abd exam no suggestive  - to be complete will send stool studies  - no abx currently has    Hypertension -uncontrolled on admit to ED -improved now  -resume home regimen   DMII, with hyperglycemia  - last A1c  -continue on insulin pump with meal time iss   Bipolar d/o  -resume home regimen   Prolonged QT syndrome  -currently borderline at 489 -continue to monitor    Anemia  stable  OSA -O2 at bedtime   Hx DVT of R IJ vein -Cont home eliquis 2.5mg  BID indefinitely    DVT prophylaxis: continue eliquis Code Status:  full/ as discussed per patient wishes in event of cardiac arrest  Family Communication: updated at bedside Disposition Plan: patient  expected to be admitted greater than 2 midnights  Consults called: Cardiology  Admission status: progressive care    Lurline Del MD [facility]   If 7PM-7AM, please contact night-coverage [email or web address] Password [email or web address]  06/06/2023, 2:55 PM

## 2023-06-06 NOTE — ED Provider Notes (Incomplete)
[place] [facility]PARTMENT AT [facility] [place] Provider Note   CSN: [id number] Arrival date & time: 06/05/23  1957     History  Chief Complaint  Patient presents with  . Nausea    Kathy Lewis is a 27 y.o. female.  HPI   27 year old female with medical history significant for type 1 DM, ESRD on HD TTS (previously on PD), HFrEF (EF 15-20%, 02/2023) 2/2 peripartum cardiomyopathy, HTN, HLD, bipolar 2 disorder, anxiety depression, OSA, cyclical nausea/vomiting and abdominal pain of unknown etiology who presents to the emergency department with uncontrolled nausea and vomiting with associated generalized abdominal pain and discomfort.  The patient states that she has had some focality of her pain to the left upper quadrant.  She had a recent hospitalization at [facility] on 10/3 for the same.  She initially endorses some episodes of diarrhea.  The patient underwent a full treatment course of dialysis today.  The patient was initially somnolent on arrival and was emergently bedded and was now verbal and moving all 4 extremities on my evaluation.  Home Medications Prior to Admission medications   Medication Sig Start Date End Date Taking? Authorizing Provider  acetaminophen (TYLENOL) 500 MG tablet Take 1,000 mg by mouth daily as needed for headache (pain).    [provider]  amitriptyline (ELAVIL) 50 MG tablet Take 1 tablet (50 mg total) by mouth at bedtime. 08/30/22   Shanna Cisco, NP  atorvastatin (LIPITOR) 40 MG tablet Take 1 tablet (40 mg total) by mouth every evening. 05/07/23   Bensimhon, Bevelyn Buckles, MD  B Complex-C-Folic Acid (RENA-VITE RX) 1 MG TABS Take 1 tablet by mouth daily. 01/01/23   [provider]  calcium acetate (PHOSLO) 667 MG capsule Take 1,334 mg by mouth 3 (three) times daily with meals. 09/28/22   [provider]  carvedilol (COREG) 25 MG tablet Take 1 tablet (25 mg total) by mouth 2 (two) times daily with a  meal. 05/07/23   Bensimhon, Bevelyn Buckles, MD  clonazePAM (KLONOPIN) 0.5 MG tablet Take 1 tablet (0.5 mg total) by mouth 2 (two) times daily as needed for anxiety. 03/16/22   Leroy Sea, MD  diazepam (VALIUM) 2 MG tablet Take 1 tablet (2 mg total) by mouth every 6 (six) hours as needed (vomiting). 05/28/23   Arthor Captain, PA-C  doxycycline (VIBRAMYCIN) 100 MG capsule Take 1 capsule (100 mg total) by mouth 2 (two) times daily. One po bid x 7 days 05/26/23   Charlynne Pander, MD  ELIQUIS 2.5 MG TABS tablet Take 2.5 mg by mouth 2 (two) times daily.    [provider]  hydrALAZINE (APRESOLINE) 100 MG tablet Take 1 tablet (100 mg total) by mouth 2 (two) times daily. 05/07/23   Bensimhon, Bevelyn Buckles, MD  hydrOXYzine (ATARAX) 10 MG tablet Take 10 mg by mouth daily. Taken before dialysis on Tues, Thurs, Sat    [provider]  insulin lispro (HUMALOG) 100 UNIT/ML injection Inject 0.5 Units into the skin See admin instructions. 0.5 units per hour via insulin pump. 01/08/22   [provider]  isosorbide mononitrate (IMDUR) 30 MG 24 hr tablet Take 1 tablet (30 mg total) by mouth daily. 10/09/22 11/20/23  Arrien, York Ram, MD  magnesium oxide (MAG-OX) 400 MG tablet Take 2 tablets (800 mg total) by mouth daily. 05/07/23   Bensimhon, Bevelyn Buckles, MD  metoCLOPramide (REGLAN) 10 MG tablet Take 1 tablet (10 mg total) by mouth every 6 (six) hours  as needed for nausea (nausea/headache). 05/26/23   Charlynne Pander, MD  omeprazole (PRILOSEC) 20 MG capsule Take 20 mg by mouth daily. 04/11/23   [provider]  oxyCODONE (ROXICODONE) 5 MG immediate release tablet Take 1 tablet (5 mg total) by mouth every 4 (four) hours as needed for severe pain. 05/26/23   Charlynne Pander, MD  sacubitril-valsartan (ENTRESTO) 24-26 MG Take 1 tablet by mouth 2 (two) times daily. 05/07/23   Bensimhon, Bevelyn Buckles, MD  SUMAtriptan (IMITREX) 50 MG tablet Take 50 mg by mouth daily as needed for headache.    [provider]  valACYclovir (VALTREX) 500 MG tablet Take 500 mg by mouth daily as needed (cold sores). 04/17/22   [provider]  Vitamin D, Ergocalciferol, (DRISDOL) 1.25 MG (50000 UNIT) CAPS capsule Take 50,000 Units by mouth every Sunday.    [provider]  escitalopram (LEXAPRO) 10 MG tablet Take 20 mg by mouth daily.  05/10/20 10/06/20  [provider]  furosemide (LASIX) 40 MG tablet Take 1 tablet (40 mg total) by mouth daily. Patient not taking: Reported on 02/16/2021 10/28/19 02/17/21  Geryl Rankins, MD  lisinopril (ZESTRIL) 10 MG tablet Take 10 mg by mouth daily.  02/23/20 10/06/20  [provider]  spironolactone (ALDACTONE) 25 MG tablet Take 25 mg by mouth daily.  04/20/20 10/06/20  [provider]      Allergies    Cantaloupe extract allergy skin test, Citrullus vulgaris, Food, Nsaids, and Strawberry extract    Review of Systems   Review of Systems  Unable to perform ROS: Acuity of condition    Physical Exam Updated Vital Signs BP 104/78   Pulse 79   Temp 97.6 F (36.4 C)   Resp 12   Ht 5' (1.524 m)   Wt 54.4 kg   LMP  (LMP Unknown)   SpO2 96%   BMI 23.44 kg/m  Physical Exam Vitals and nursing note reviewed.  Constitutional:      General: She is in acute distress.     Appearance: She is well-developed.     Comments: Actively retching  HENT:     Head: Normocephalic and atraumatic.  Eyes:     Conjunctiva/sclera: Conjunctivae normal.  Cardiovascular:     Rate and Rhythm: Normal rate and regular rhythm.  Pulmonary:     Effort: Pulmonary effort is normal. No respiratory distress.     Breath sounds: Normal breath sounds.  Abdominal:     Palpations: Abdomen is soft.     Tenderness: There is abdominal tenderness.     Comments: Generalized abdominal tenderness, some focality to the left upper quadrant  Musculoskeletal:        General: No swelling.     Cervical back: Neck supple.  Skin:    General: Skin is warm and dry.      Capillary Refill: Capillary refill takes less than 2 seconds.  Neurological:     Mental Status: She is alert.  Psychiatric:        Mood and Affect: Mood normal.     ED Results / Procedures / Treatments   Labs (all labs ordered are listed, but only abnormal results are displayed) Labs Reviewed  COMPREHENSIVE METABOLIC PANEL - Abnormal; Notable for the following components:      Result Value   Sodium 132 (*)    Glucose, Bld 316 (*)    BUN 39 (*)    Creatinine, Ser 5.95 (*)    GFR, Estimated 9 (*)  All other components within normal limits  CBC - Abnormal; Notable for the following components:   WBC 3.6 (*)    All other components within normal limits  CBG MONITORING, ED - Abnormal; Notable for the following components:   Glucose-Capillary 286 (*)    All other components within normal limits  TROPONIN I (HIGH SENSITIVITY) - Abnormal; Notable for the following components:   Troponin I (High Sensitivity) 598 (*)    All other components within normal limits  TROPONIN I (HIGH SENSITIVITY) - Abnormal; Notable for the following components:   Troponin I (High Sensitivity) 492 (*)    All other components within normal limits  LIPASE, BLOOD  HCG, QUANTITATIVE, PREGNANCY  URINALYSIS, ROUTINE W REFLEX MICROSCOPIC    EKG EKG Interpretation Date/Time:  Tuesday June 05 2023 20:40:06 EDT Ventricular Rate:  103 PR Interval:  160 QRS Duration:  108 QT Interval:  373 QTC Calculation: 489 R Axis:   201  Text Interpretation: Sinus tachycardia Biatrial enlargement Right axis deviation Minimal ST depression, lateral leads Borderline prolonged QT interval Confirmed by Ernie Avena ([id number]) on 06/05/2023 8:48:24 PM  Radiology CT ABDOMEN PELVIS WO CONTRAST  Result Date: 06/05/2023 CLINICAL DATA:  Abdomen pain nausea and diarrhea EXAM: CT ABDOMEN AND PELVIS WITHOUT CONTRAST TECHNIQUE: Multidetector CT imaging of the abdomen and pelvis was performed following the standard protocol without IV  contrast. RADIATION DOSE REDUCTION: This exam was performed according to the departmental dose-optimization program which includes automated exposure control, adjustment of the mA and/or kV according to patient size and/or use of iterative reconstruction technique. COMPARISON:  CT 05/26/2023 FINDINGS: Lower chest: Marked cardiomegaly with small pericardial effusion, maximum thickness of 2.2 cm along the right cardiac border. Diffuse hazy pulmonary density at the bases suggesting mild edema. Atelectasis at the left base. Hepatobiliary: No focal liver abnormality is seen. No gallstones, gallbladder wall thickening, or biliary dilatation. Pancreas: Unremarkable. No pancreatic ductal dilatation or surrounding inflammatory changes. Spleen: Normal in size without focal abnormality. Adrenals/Urinary Tract: Adrenal glands are unremarkable. Kidneys are normal, without renal calculi, focal lesion, or hydronephrosis. Bladder is unremarkable. Stomach/Bowel: The stomach is nonenlarged. No dilated small bowel. Radiopaque material in the colon. Mild wall thickening of the descending colon with surrounding stranding, consistent with mild colitis, coronal series 603, image 65 through 77. Vascular/Lymphatic: Mild renal artery and SMA calcifications. No aneurysm. No suspicious lymph nodes Reproductive: Uterus and bilateral adnexa are unremarkable. Other: No free air. Small volume free fluid within the pelvis and abdomen Musculoskeletal: No acute osseous abnormality IMPRESSION: 1. Mild wall thickening of the descending colon with surrounding stranding consistent with mild colitis of infectious or inflammatory etiology. 2. Marked cardiomegaly with small pericardial effusion. Hazy pulmonary density at the bases suggesting mild edema. Small volume free fluid within the abdomen and pelvis. Electronically Signed   By: Jasmine Pang M.D.   On: 06/05/2023 21:41    Procedures Procedures    Medications Ordered in ED Medications   LORazepam (ATIVAN) injection 1 mg (1 mg Intravenous Given 06/05/23 2031)  fentaNYL (SUBLIMAZE) injection 50 mcg (50 mcg Intravenous Given 06/05/23 2032)  droperidol (INAPSINE) 2.5 MG/ML injection 1.25 mg (1.25 mg Intravenous Given 06/05/23 2033)    ED Course/ Medical Decision Making/ A&P Clinical Course as of 06/06/23 0001  Tue Jun 05, 2023  2312 Troponin I (High Sensitivity)(!!): 598 [JL]    Clinical Course User Index [JL] Ernie Avena, MD  Medical Decision Making Amount and/or Complexity of Data Reviewed Labs: ordered. Decision-making details documented in ED Course. Radiology: ordered.  Risk Prescription drug management. Decision regarding hospitalization.    27 year old female with medical history significant for type 1 DM, ESRD on HD TTS (previously on PD), HFrEF (EF 15-20%, 02/2023) 2/2 peripartum cardiomyopathy, HTN, HLD, bipolar 2 disorder, anxiety depression, OSA, cyclical nausea/vomiting and abdominal pain of unknown etiology who presents to the emergency department with uncontrolled nausea and vomiting with associated generalized abdominal pain and discomfort.  The patient states that she has had some focality of her pain to the left upper quadrant.  She had a recent hospitalization at [facility] on 10/3 for the same.   On arrival, the patient was not in acute distress, complaining of severe nausea and vomiting and generalized abdominal pain that she describes as severe.  She was afebrile, tachycardic heart rate 105, hypertensive BP 178/116, saturating 100% on room air.  EKG: Sinus tachycardia, ventricular rate 103, minimal ST changes that were nonspecific, no STEMI.  Physical exam revealed generalized abdominal tenderness and the patient was actively retching.  IV access was obtained and the patient was administered IV Ativan, IV fentanyl for pain control and 1.25 mg of IV droperidol.    Laboratory evaluation revealed  a CBG of 286, hCG normal, CBC with a nonspecific leukopenia to 3.6, no anemia with a hemoglobin of 13, CMP with hyperglycemia to 316, no acidosis with a bicarbonate of 22, anion gap of 11, normal LFTs, potassium normal at 5 after her dialysis session today.  Lipase was also normal.  Initial cardiac troponin was found to be elevated at 598, repeat was downtrending to 492.  On repeat assessment, the patient was feeling symptomatically improved.  She denied any chest pain or shortness of breath.  CT abdomen pelvis without contrast revealed evidence of a small pericardial effusion with pulmonary edema present that was mild, intra-abdominal she was found to have mild pelvic free fluid and a colitis. IMPRESSION:  1. Mild wall thickening of the descending colon with surrounding  stranding consistent with mild colitis of infectious or inflammatory  etiology.  2. Marked cardiomegaly with small pericardial effusion. Hazy  pulmonary density at the bases suggesting mild edema. Small volume  free fluid within the abdomen and pelvis.   I discussed the care of the patient with on-call cardiology, Dr. Piedad Climes who recommended admission to the hospitalist for observation, evaluation by the advanced heart failure team in the morning.  The patient did have to be placed on oxygen briefly after fentanyl administration, does have evidence of mild pulmonary edema on CT imaging.  She remained hemodynamically stable with pain improved on repeat assessment.  Plan at time of signout to admit the patient to the hospitalist service, signout given to Dr. Bernette Mayers at 0000.   Final Clinical Impression(s) / ED Diagnoses Final diagnoses:  Nausea and vomiting, unspecified vomiting type  Generalized abdominal pain  Elevated troponin  Colitis    Rx / DC Orders ED Discharge Orders     None         Ernie Avena, MD 06/06/23 0000

## 2023-06-06 NOTE — ED Notes (Signed)
Assumed care of patient.

## 2023-06-06 NOTE — Progress Notes (Signed)
Hospitalist Transfer Note:    Nursing staff, Please call [facility] Admits & Consults System-Wide number on Amion [phone]) as soon as patient's arrival, so appropriate admitting provider can evaluate the pt.   Transferring facility: [facility] Requesting provider: Dr. Ernie Avena (EDP at [facility]) Reason for transfer: admission for further evaluation and management of suspected acute on chronic systolic heart failure.    27 year old female with end-stage renal disease on hemodialysis on Tuesday, Thursday, Saturday schedule, type 1 diabetes mellitus, and chronic systolic heart failure with most recent LVEF 15 to 20%, who presented to [facility] ED complaining of N/V and abdominal pain, which started during her hemodialysis session.  She reportedly has presented to the ED before with similar symptoms that have occurred during her hemodialysis.  Today, she was able to complete the totality of her HD session.  She denies any associated chest pain, and her nausea, vomiting, abdominal pain resolved with single doses of droperidol, fentanyl, and Ativan administered at [facility] this evening.   Vital signs in the ED were notable for the following: Afebrile; oxygen saturation decreased into the high 80s following dose of IV fentanyl, prompting transient initiation of 2 L nasal cannula.  This was subsequently weaned and the patient is now maintaining oxygen saturations in the mid to high 90s on room air.  Labs were notable for initial troponin 600, with repeat value trending down to 492.  EKG is reported to show sinus rhythm without overt evidence of acute ischemic changes.  Imaging notable for CT abdomen/pelvis without contrast showed evidence of a small pericardial effusion as well as suggestion of pulmonary edema.  EDP d/w on-call cardiology fellow, Dr. Piedad Climes, who recommended [facility] admission to [facility], and conveyed that cardiology will consult, will plan for the heart failure team to evaluate the patient in the  AM   Subsequently, I accepted this patient for transfer for inpatient admission to a pcu bed at [facility] for further work-up and management of the above.      Newton Pigg, DO Hospitalist

## 2023-06-07 ENCOUNTER — Inpatient Hospital Stay (HOSPITAL_COMMUNITY): Payer: 59

## 2023-06-07 DIAGNOSIS — I5023 Acute on chronic systolic (congestive) heart failure: Secondary | ICD-10-CM

## 2023-06-07 DIAGNOSIS — I10 Essential (primary) hypertension: Secondary | ICD-10-CM | POA: Diagnosis not present

## 2023-06-07 LAB — COMPREHENSIVE METABOLIC PANEL
ALT: 13 U/L (ref 0–44)
ALT: 14 U/L (ref 0–44)
AST: 14 U/L — ABNORMAL LOW (ref 15–41)
AST: 17 U/L (ref 15–41)
Albumin: 3 g/dL — ABNORMAL LOW (ref 3.5–5.0)
Albumin: 3.2 g/dL — ABNORMAL LOW (ref 3.5–5.0)
Alkaline Phosphatase: 77 U/L (ref 38–126)
Alkaline Phosphatase: 82 U/L (ref 38–126)
Anion gap: 13 (ref 5–15)
Anion gap: 14 (ref 5–15)
BUN: 55 mg/dL — ABNORMAL HIGH (ref 6–20)
BUN: 66 mg/dL — ABNORMAL HIGH (ref 6–20)
CO2: 24 mmol/L (ref 22–32)
CO2: 20 mmol/L — ABNORMAL LOW (ref 22–32)
Calcium: 9.1 mg/dL (ref 8.9–10.3)
Calcium: 9.2 mg/dL (ref 8.9–10.3)
Chloride: 99 mmol/L (ref 98–111)
Chloride: 99 mmol/L (ref 98–111)
Creatinine, Ser: 9.22 mg/dL — ABNORMAL HIGH (ref 0.44–1.00)
Creatinine, Ser: 9.46 mg/dL — ABNORMAL HIGH (ref 0.44–1.00)
GFR, Estimated: 6 mL/min — ABNORMAL LOW (ref >60)
GFR, Estimated: 5 mL/min — ABNORMAL LOW (ref >60)
Glucose, Bld: 185 mg/dL — ABNORMAL HIGH (ref 70–99)
Glucose, Bld: 313 mg/dL — ABNORMAL HIGH (ref 70–99)
Potassium: 5.1 mmol/L (ref 3.5–5.1)
Potassium: 5.7 mmol/L — ABNORMAL HIGH (ref 3.5–5.1)
Sodium: 136 mmol/L (ref 135–145)
Sodium: 133 mmol/L — ABNORMAL LOW (ref 135–145)
Total Bilirubin: 0.6 mg/dL (ref 0.3–1.2)
Total Bilirubin: 0.8 mg/dL (ref 0.3–1.2)
Total Protein: 6.2 g/dL — ABNORMAL LOW (ref 6.5–8.1)
Total Protein: 6.6 g/dL (ref 6.5–8.1)

## 2023-06-07 LAB — CBC
HCT: 35.4 % — ABNORMAL LOW (ref 36.0–46.0)
HCT: 38.8 % (ref 36.0–46.0)
Hemoglobin: 11.4 g/dL — ABNORMAL LOW (ref 12.0–15.0)
Hemoglobin: 12.4 g/dL (ref 12.0–15.0)
MCH: 31.9 pg (ref 26.0–34.0)
MCH: 31.9 pg (ref 26.0–34.0)
MCHC: 32.2 g/dL (ref 30.0–36.0)
MCHC: 32 g/dL (ref 30.0–36.0)
MCV: 99.2 fL (ref 80.0–100.0)
MCV: 99.7 fL (ref 80.0–100.0)
Platelets: 190 10*3/uL (ref 150–400)
Platelets: UNDETERMINED 10*3/uL (ref 150–400)
RBC: 3.57 MIL/uL — ABNORMAL LOW (ref 3.87–5.11)
RBC: 3.89 MIL/uL (ref 3.87–5.11)
RDW: 15.3 % (ref 11.5–15.5)
RDW: 15.4 % (ref 11.5–15.5)
WBC: 3.5 10*3/uL — ABNORMAL LOW (ref 4.0–10.5)
WBC: 2.3 10*3/uL — ABNORMAL LOW (ref 4.0–10.5)
nRBC: 0 % (ref 0.0–0.2)
nRBC: 0 % (ref 0.0–0.2)

## 2023-06-07 LAB — GLUCOSE, CAPILLARY
Glucose-Capillary: 155 mg/dL — ABNORMAL HIGH (ref 70–99)
Glucose-Capillary: 167 mg/dL — ABNORMAL HIGH (ref 70–99)
Glucose-Capillary: 202 mg/dL — ABNORMAL HIGH (ref 70–99)
Glucose-Capillary: 228 mg/dL — ABNORMAL HIGH (ref 70–99)
Glucose-Capillary: 277 mg/dL — ABNORMAL HIGH (ref 70–99)

## 2023-06-07 LAB — ECHOCARDIOGRAM LIMITED
Calc EF: 23.3 %
Height: 60 in
Single Plane A2C EF: 26.6 %
Single Plane A4C EF: 22 %
Weight: 2084.67 [oz_av]

## 2023-06-07 LAB — POTASSIUM
Potassium: 4.8 mmol/L (ref 3.5–5.1)
Potassium: 5.8 mmol/L — ABNORMAL HIGH (ref 3.5–5.1)

## 2023-06-07 LAB — TROPONIN I (HIGH SENSITIVITY)
Troponin I (High Sensitivity): 351 ng/L (ref <18)
Troponin I (High Sensitivity): 450 ng/L (ref <18)

## 2023-06-07 LAB — HEPATITIS B SURFACE ANTIGEN: Hepatitis B Surface Ag: NONREACTIVE

## 2023-06-07 MED ORDER — RENA-VITE PO TABS
1.0000 | ORAL_TABLET | Freq: Every day | ORAL | Status: DC
  Administered 2023-06-07 – 2023-06-10 (×3): 1 via ORAL
  Filled 2023-06-07 (×2): qty 1

## 2023-06-07 MED ORDER — HEPARIN SODIUM (PORCINE) 1000 UNIT/ML DIALYSIS: 1000.0000 [IU] | INTRAMUSCULAR | Status: DC | PRN

## 2023-06-07 MED ORDER — PANTOPRAZOLE SODIUM 40 MG PO TBEC
40.0000 mg | DELAYED_RELEASE_TABLET | Freq: Every day | ORAL | Status: DC
  Administered 2023-06-07 – 2023-06-09 (×3): 40 mg via ORAL
  Filled 2023-06-07 (×3): qty 1

## 2023-06-07 MED ORDER — HEPARIN SODIUM (PORCINE) 1000 UNIT/ML DIALYSIS: 2000.0000 [IU] | INTRAMUSCULAR | Status: DC | PRN

## 2023-06-07 MED ORDER — APIXABAN 2.5 MG PO TABS: 2.5000 mg | ORAL_TABLET | Freq: Two times a day (BID) | ORAL | Status: DC

## 2023-06-07 MED ORDER — ALTEPLASE 2 MG IJ SOLR: 2.0000 mg | Freq: Once | INTRAMUSCULAR | Status: DC | PRN

## 2023-06-07 MED ORDER — HEPARIN SODIUM (PORCINE) 1000 UNIT/ML DIALYSIS
2500.0000 [IU] | Freq: Once | INTRAMUSCULAR | Status: AC
  Administered 2023-06-07: 2500 [IU] via INTRAVENOUS_CENTRAL
  Filled 2023-06-07: qty 3

## 2023-06-07 MED ORDER — ANTICOAGULANT SODIUM CITRATE 4% (200MG/5ML) IV SOLN: 5.0000 mL | Status: DC | PRN

## 2023-06-07 MED ORDER — INSULIN ASPART 100 UNIT/ML IJ SOLN
5.0000 [IU] | Freq: Three times a day (TID) | INTRAMUSCULAR | Status: DC
  Administered 2023-06-07: 5 [IU] via SUBCUTANEOUS

## 2023-06-07 MED ORDER — CALCIUM ACETATE (PHOS BINDER) 667 MG PO CAPS
1334.0000 mg | ORAL_CAPSULE | Freq: Three times a day (TID) | ORAL | Status: DC
  Administered 2023-06-07 – 2023-06-10 (×8): 1334 mg via ORAL
  Filled 2023-06-07 (×8): qty 2

## 2023-06-07 MED ORDER — CLONAZEPAM 0.125 MG PO TBDP
0.2500 mg | ORAL_TABLET | Freq: Two times a day (BID) | ORAL | Status: DC
  Administered 2023-06-07 – 2023-06-10 (×6): 0.25 mg via ORAL
  Filled 2023-06-07 (×6): qty 2

## 2023-06-07 MED ORDER — CHLORHEXIDINE GLUCONATE CLOTH 2 % EX PADS
6.0000 | MEDICATED_PAD | Freq: Every day | CUTANEOUS | Status: DC
  Administered 2023-06-07 – 2023-06-08 (×2): 6 via TOPICAL

## 2023-06-07 MED ORDER — HYDRALAZINE HCL 20 MG/ML IJ SOLN: 10.0000 mg | Freq: Four times a day (QID) | INTRAMUSCULAR | Status: DC | PRN

## 2023-06-07 MED ORDER — APIXABAN 2.5 MG PO TABS
2.5000 mg | ORAL_TABLET | Freq: Two times a day (BID) | ORAL | Status: DC
  Administered 2023-06-07 – 2023-06-10 (×6): 2.5 mg via ORAL
  Filled 2023-06-07 (×5): qty 1

## 2023-06-07 MED ORDER — SODIUM ZIRCONIUM CYCLOSILICATE 10 G PO PACK
10.0000 g | PACK | Freq: Once | ORAL | Status: AC
  Administered 2023-06-07: 10 g via ORAL
  Filled 2023-06-07: qty 1

## 2023-06-07 NOTE — Consult Note (Signed)
Renal Service Consult Note [facility]  Nisha Dhami 06/07/2023 [name], MD Requesting Physician: Dr. Jomarie Longs  Reason for Consult: ESRD pt w/ N/V and abd pain.  HPI: The patient is a 27 y.o. year-old w/ PMH as below who presented w/ abd pain and N/V.  Pt was admitted, CT abd was done. We are asked to see for dialysis.   Pt seen in room. Abd pain is main c/o. No SOB, cp or fevers. No recent access or HD issues.    ROS - denies CP, no joint pain, no HA, no blurry vision, no rash, no dysuria, no difficulty voiding   Past Medical History  Past Medical History:  Diagnosis Date   Anemia    Anxiety    Bipolar 2 disorder (HCC)    Chronic kidney disease    Chronic systolic (congestive) heart failure (HCC)    Depression    DKA (diabetic ketoacidoses)    ESRD on peritoneal dialysis (HCC)    HSV infection    on valtrex   Hypokalemia    Leukocytosis    Migraine    Noncompliance with medication regimen    Preeclampsia    Prolonged QT syndrome    Severe anemia    Type 1 diabetes mellitus (HCC)    Past Surgical History  Past Surgical History:  Procedure Laterality Date   BIOPSY  04/24/2022   Procedure: BIOPSY;  Surgeon: Jenel Lucks, MD;  Location: [facility] ENDOSCOPY;  Service: Gastroenterology;;   CARDIAC CATHETERIZATION     COLONOSCOPY WITH PROPOFOL N/A 04/24/2022   Procedure: COLONOSCOPY WITH PROPOFOL;  Surgeon: Jenel Lucks, MD;  Location: [facility] ENDOSCOPY;  Service: Gastroenterology;  Laterality: N/A;   DILATION AND EVACUATION N/A 10/22/2019   Procedure: ULTRASOUND GUIDED DILATATION AND EVACUATION;  Surgeon: Geryl Rankins, MD;  Location: [facility] LD ORS;  Service: Gynecology;  Laterality: N/A;   ESOPHAGOGASTRODUODENOSCOPY (EGD) WITH PROPOFOL N/A 04/24/2022   Procedure: ESOPHAGOGASTRODUODENOSCOPY (EGD) WITH PROPOFOL;  Surgeon: Jenel Lucks, MD;  Location: [facility] ENDOSCOPY;  Service: Gastroenterology;  Laterality: N/A;   IR FLUORO GUIDE CV LINE RIGHT   09/14/2022   IR US GUIDE VASC ACCESS RIGHT  09/14/2022   peritoneal dialysis catheter insertion     RENAL BIOPSY     Family History  Family History  Adopted: Yes  Problem Relation Age of Onset   Heart disease Neg Hx    Social History  reports that she has quit smoking. Her smoking use included cigars and cigarettes. She has a 2 pack-year smoking history. She has never used smokeless tobacco. She reports that she does not drink alcohol and does not use drugs. Allergies  Allergies  Allergen Reactions   Cantaloupe Extract Allergy Skin Test Itching    Mouth itching     Citrullus Vulgaris Itching and Other (See Comments)    Makes mouth itch, ALL melons     Food Itching    All melon - mouth itching   Strawberry Extract Itching    Mouth itching   Nsaids Itching and Other (See Comments)    Avoid per nephrology   Home medications Prior to Admission medications   Medication Sig Start Date End Date Taking? Authorizing Provider  acetaminophen (TYLENOL) 500 MG tablet Take 1,000 mg by mouth daily as needed for headache (pain).   Yes [provider]  amitriptyline (ELAVIL) 50 MG tablet Take 1 tablet (50 mg total) by mouth at bedtime. 08/30/22  Yes Toy Cookey E, NP  atorvastatin (LIPITOR) 40 MG  tablet Take 1 tablet (40 mg total) by mouth every evening. 05/07/23  Yes Bensimhon, Bevelyn Buckles, MD  B Complex-C-Folic Acid (RENA-VITE RX) 1 MG TABS Take 1 tablet by mouth daily. 01/01/23  Yes [provider]  butalbital-acetaminophen-caffeine (FIORICET) 50-325-40 MG tablet Take 1 tablet by mouth every 6 (six) hours as needed. 11/15/21  Yes [provider]  calcium acetate (PHOSLO) 667 MG capsule Take 1,334 mg by mouth 3 (three) times daily with meals. 09/28/22  Yes [provider]  carvedilol (COREG) 25 MG tablet Take 1 tablet (25 mg total) by mouth 2 (two) times daily with a meal. 05/07/23  Yes Bensimhon, Bevelyn Buckles, MD  clonazePAM (KLONOPIN) 0.5 MG tablet Take 1 tablet (0.5  mg total) by mouth 2 (two) times daily as needed for anxiety. Patient taking differently: Take 0.5 mg by mouth at bedtime. 03/16/22  Yes Leroy Sea, MD  diazepam (VALIUM) 2 MG tablet Take 1 tablet (2 mg total) by mouth every 6 (six) hours as needed (vomiting). 05/28/23  Yes Harris, Abigail, PA-C  ELIQUIS 2.5 MG TABS tablet Take 2.5 mg by mouth 2 (two) times daily.   Yes [provider]  hydrALAZINE (APRESOLINE) 100 MG tablet Take 1 tablet (100 mg total) by mouth 2 (two) times daily. 05/07/23  Yes Bensimhon, Bevelyn Buckles, MD  hydrOXYzine (ATARAX) 10 MG tablet Take 10 mg by mouth daily. Also taken before dialysis on Tues, Thurs, Sat.   Yes [provider]  insulin lispro (HUMALOG) 100 UNIT/ML injection Inject 0.5 Units into the skin See admin instructions. 0.5 units per hour via insulin pump. 01/08/22  Yes [provider]  isosorbide mononitrate (IMDUR) 30 MG 24 hr tablet Take 1 tablet (30 mg total) by mouth daily. 10/09/22 11/20/23 Yes Arrien, York Ram, MD  magnesium oxide (MAG-OX) 400 MG tablet Take 2 tablets (800 mg total) by mouth daily. 05/07/23  Yes Bensimhon, Bevelyn Buckles, MD  methocarbamol (ROBAXIN) 500 MG tablet Take 500 mg by mouth as needed for muscle spasms.   Yes [provider]  omeprazole (PRILOSEC) 20 MG capsule Take 20 mg by mouth daily. 04/11/23  Yes [provider]  oxyCODONE (ROXICODONE) 5 MG immediate release tablet Take 1 tablet (5 mg total) by mouth every 4 (four) hours as needed for severe pain. 05/26/23  Yes Charlynne Pander, MD  sacubitril-valsartan (ENTRESTO) 24-26 MG Take 1 tablet by mouth 2 (two) times daily. 05/07/23  Yes Bensimhon, Bevelyn Buckles, MD  SUMAtriptan (IMITREX) 50 MG tablet Take 50 mg by mouth daily as needed for headache.   Yes [provider]  valACYclovir (VALTREX) 500 MG tablet Take 500 mg by mouth daily as needed (cold sores). 04/17/22  Yes [provider]  Vitamin D, Ergocalciferol, (DRISDOL) 1.25 MG  (50000 UNIT) CAPS capsule Take 50,000 Units by mouth every Sunday.   Yes [provider]  doxycycline (VIBRAMYCIN) 100 MG capsule Take 1 capsule (100 mg total) by mouth 2 (two) times daily. One po bid x 7 days Patient not taking: Reported on 06/06/2023 05/26/23   Charlynne Pander, MD  metoCLOPramide (REGLAN) 10 MG tablet Take 1 tablet (10 mg total) by mouth every 6 (six) hours as needed for nausea (nausea/headache). Patient not taking: Reported on 06/06/2023 05/26/23   Charlynne Pander, MD  escitalopram (LEXAPRO) 10 MG tablet Take 20 mg by mouth daily.  05/10/20 10/06/20  [provider]  furosemide (LASIX) 40 MG tablet Take 1 tablet (40 mg total) by mouth daily. Patient  not taking: Reported on 02/16/2021 10/28/19 02/17/21  Geryl Rankins, MD  lisinopril (ZESTRIL) 10 MG tablet Take 10 mg by mouth daily.  02/23/20 10/06/20  [provider]  spironolactone (ALDACTONE) 25 MG tablet Take 25 mg by mouth daily.  04/20/20 10/06/20  [provider]     Vitals:   06/06/23 1959 06/07/23 0008 06/07/23 0414 06/07/23 0809  BP: (!) 123/90 122/82 (!) 131/96 (!) 130/110  Pulse: 77 85 82 87  Resp: 18 17 18 18   Temp: 98.3 F (36.8 C) 98.5 F (36.9 C) 98.2 F (36.8 C) 98.6 F (37 C)  TempSrc: Oral Oral Oral Oral  SpO2: 95% 96% 96% 94%  Weight:   59.1 kg   Height:       Exam Gen alert, no distress, quiet No rash, cyanosis or gangrene Sclera anicteric, throat clear  No jvd or bruits Chest clear bilat to bases, no rales/ wheezing RRR no MRG Abd soft ntnd no mass or ascites +bs GU defer MS no joint effusions or deformity Ext no LE or UE edema, no wounds or ulcers Neuro is alert, Ox 3 , nf     TDC RIJ intact      Renal-related home meds: - renavite - phoslo 2 ac  - coreg 25 bid - eliquis 2.5 bid - hydralazine 100 bid - imdur 30  - entresto bid    OP HD:  [facility] [address]  TTS  3.5h  300/600  TDC  Heparin 2000 + 500u/hr (needs updating, this from June  2024)       BP 130/96   HR 82  RR 17   RA 96%    Na 136  K 5.1  CO 2 24  bun 55  creat 9.22   alb 3.0  Ca 9.1    Wbc 3.5k    Hb 11.4      Assessment/ Plan: Abdominal pain - w/ N/V. Per pmd ESRD - on HD TTS. HD today.  HTN- BP's are normal.  Volume - no vol excess on exam. Get records. UF 1.5- 2 L as tol.  Anemia esrd - Hb 13, no esa needs. Follow.  MBD ckd - CCa and phos are in range. Cont phoslo as binder. Get records.  DM1 - per pmd HFrEF - last EF 15-20% DVT of R internal jugular vein - on eliquis OSA - wears O2 at night     Vinson Moselle  MD [facility] 06/07/2023, 9:55 AM  Recent Labs  Lab 06/06/23 1558 06/06/23 1731 06/06/23 2009 06/06/23 2313 06/07/23 0325  HGB 13.2  --   --   --  11.4*  ALBUMIN  --  3.3*  --   --  3.0*  CALCIUM  --  9.4 9.0  --  9.1  PHOS  --  5.0*  --   --   --   CREATININE  --  7.60* 7.87*  --  9.22*  K  --  6.1* 5.3* 4.8 5.1   Inpatient medications:  amitriptyline  50 mg Oral QHS   apixaban  2.5 mg Oral BID   atorvastatin  40 mg Oral QPM   carvedilol  25 mg Oral BID WC   hydrALAZINE  100 mg Oral BID   hydrOXYzine  10 mg Oral Daily   insulin aspart  0-6 Units Subcutaneous TID WC   insulin aspart  2 Units Subcutaneous TID WC   insulin pump   Subcutaneous Q4H   isosorbide mononitrate  30 mg Oral Daily  sacubitril-valsartan  1 tablet Oral BID    acetaminophen, albuterol, butalbital-acetaminophen-caffeine, fentaNYL (SUBLIMAZE) injection, oxyCODONE, SUMAtriptan

## 2023-06-07 NOTE — Progress Notes (Signed)
PROGRESS NOTE    Kathy Lewis  ZOX:[id number] DOB: Jun 27, 1996 DOA: 06/05/2023 PCP: Loura Pardon, PA  27/F w ESRD TTS, type 1 diabetes mellitus on insulin drip, and chronic systolic heart failure with most recent LVEF 15 to 20%, bipolar d/o who presented to [facility] ED complaining of N/V and abdominal pain, which started during her hemodialysis session.  Patient states she has had intermittent  abdominal pain x 1 year . Patient had EGD/Colo 03/2022 which were normal. Gastric emptying study was also normal. GI suspected cyclical abd pain/N/V and possible element of abdominal congestion from heart failure. -ED workup w/  elevated cardiac enzyme cardiology Dr. Piedad Climes,  was consulted and recommend admission to hospitalist service for further evaluation.  ED Course: bp 178/116 repeat 142/79, hr 105, rr 24,  troponin 598, with repeat value trending down to 492. -EKG -nsr,  st depression lateral leads  -CT abdomen/pelvis with mild thickening of descending colon, cardiomegaly, small pericardial effusion and small volume of free fluid in abdomen pelvis  Subjective: Has intermittent abdominal pain for about a year now, relieved by fentanyl, no bowel changes   Assessment and Plan:  Elevated troponin -Likely from demand, insetting of periods of uncontrolled hypertension  -Clinically do not suspect ACS, troponin trending down -Follow-up echo  Acute on chronic abdominal pain -Longstanding issue for over a year -GI workup with EGD colonoscopy last year were unremarkable, gastric emptying scan was also largely normal -GI felt her symptoms could be from congestion related to CHF, volume overload, now dependent on dialysis -Continue supportive care, also recommended elimination diet -Clinically do not suspect colitis   Acute on Chronic systolic CHF -ECHO 9/24 w/ EF 81-19, grade 3DD, mod reduced RV, mod MR Probable combination Hfref exacerbation in setting of ESRD -HD for fluid management -Hold  Aldactone and Entresto with hyperkalemia  ESRD on HD TTS -Nephrology consulted, -Attempt to decrease dry weight as tolerated   Hyperkalemia  -Hold Aldactone and Entresto, repeat Lokelma   Hypertension -uncontrolled on admit to ED -improved now  -resume home regimen    DMII, with hyperglycemia  - last A1c  -continue on insulin pump with meal time iss    Bipolar d/o  -resume home regimen    Prolonged QT syndrome  -currently borderline at 489 -continue to monitor     Anemia  stable   OSA -O2 at bedtime    Hx DVT of R IJ vein -Cont home eliquis 2.5mg  BID indefinitely    DVT prophylaxis: apixaban Code Status: Full Code Family Communication: None present Disposition Plan: Home likely 1 to 2 days  Consultants:    Procedures:   Antimicrobials:    Objective: Vitals:   06/06/23 1416 06/06/23 1959 06/07/23 0008 06/07/23 0414  BP: (!) 132/107 (!) 123/90 122/82 (!) 131/96  Pulse: 90 77 85 82  Resp: 18 18 17 18   Temp: 98.4 F (36.9 C) 98.3 F (36.8 C) 98.5 F (36.9 C) 98.2 F (36.8 C)  TempSrc: Oral Oral Oral Oral  SpO2: 100% 95% 96% 96%  Weight:    59.1 kg  Height:        Intake/Output Summary (Last 24 hours) at 06/07/2023 0535 Last data filed at 06/06/2023 2000 Gross per 24 hour  Intake 240 ml  Output --  Net 240 ml   Filed Weights   06/05/23 2013 06/07/23 0414  Weight: 54.4 kg 59.1 kg    Examination:      Data Reviewed:   CBC: Recent Labs  Lab 06/05/23 2014  06/06/23 1558 06/07/23 0325  WBC 3.6* 3.4* 3.5*  NEUTROABS  --  1.8  --   HGB 13.0 13.2 11.4*  HCT 40.5 39.8 35.4*  MCV 98.8 100.3* 99.2  PLT 180 171 190   Basic Metabolic Panel: Recent Labs  Lab 06/05/23 2014 06/06/23 1731 06/06/23 2009 06/06/23 2313 06/07/23 0325  NA 132* 135 132*  --  136  K 5.0 6.1* 5.3* 4.8 5.1  CL 99 103 98  --  99  CO2 22 18* 21*  --  24  GLUCOSE 316* 332* 479*  --  185*  BUN 39* 50* 50*  --  55*  CREATININE 5.95* 7.60* 7.87*  --  9.22*   CALCIUM 9.1 9.4 9.0  --  9.1  MG  --  2.5*  --   --   --   PHOS  --  5.0*  --   --   --    GFR: Estimated Creatinine Clearance: 7.4 mL/min (A) (by C-G formula based on SCr of 9.22 mg/dL (H)). Liver Function Tests: Recent Labs  Lab 06/05/23 2014 06/06/23 1731 06/07/23 0325  AST 21 24 14*  ALT 15 14 13   ALKPHOS 93 84 77  BILITOT 0.8 1.1 0.6  PROT 7.4 6.9 6.2*  ALBUMIN 3.7 3.3* 3.0*   Recent Labs  Lab 06/05/23 2014  LIPASE 29   No results for input(s): "AMMONIA" in the last 168 hours. Coagulation Profile: No results for input(s): "INR", "PROTIME" in the last 168 hours. Cardiac Enzymes: No results for input(s): "CKTOTAL", "CKMB", "CKMBINDEX", "TROPONINI" in the last 168 hours. BNP (last 3 results) No results for input(s): "PROBNP" in the last 8760 hours. HbA1C: Recent Labs    06/06/23 1558  HGBA1C 9.0*   CBG: Recent Labs  Lab 06/06/23 1748 06/06/23 1917 06/06/23 2051 06/07/23 0014 06/07/23 0417  GLUCAP 370* 465* 419* 167* 202*   Lipid Profile: No results for input(s): "CHOL", "HDL", "LDLCALC", "TRIG", "CHOLHDL", "LDLDIRECT" in the last 72 hours. Thyroid Function Tests: No results for input(s): "TSH", "T4TOTAL", "FREET4", "T3FREE", "THYROIDAB" in the last 72 hours. Anemia Panel: No results for input(s): "VITAMINB12", "FOLATE", "FERRITIN", "TIBC", "IRON", "RETICCTPCT" in the last 72 hours. Urine analysis:    Component Value Date/Time   COLORURINE YELLOW 05/28/2023 1440   APPEARANCEUR HAZY (A) 05/28/2023 1440   LABSPEC 1.013 05/28/2023 1440   PHURINE 5.0 05/28/2023 1440   GLUCOSEU >=500 (A) 05/28/2023 1440   HGBUR SMALL (A) 05/28/2023 1440   BILIRUBINUR NEGATIVE 05/28/2023 1440   KETONESUR NEGATIVE 05/28/2023 1440   PROTEINUR >=300 (A) 05/28/2023 1440   UROBILINOGEN 0.2 12/15/2015 1736   NITRITE NEGATIVE 05/28/2023 1440   LEUKOCYTESUR NEGATIVE 05/28/2023 1440   Sepsis Labs: @LABRCNTIP (procalcitonin:4,lacticidven:4)  )No results found for this or any  previous visit (from the past 240 hour(s)).   Radiology Studies: CT ABDOMEN PELVIS WO CONTRAST  Result Date: 06/05/2023 CLINICAL DATA:  Abdomen pain nausea and diarrhea EXAM: CT ABDOMEN AND PELVIS WITHOUT CONTRAST TECHNIQUE: Multidetector CT imaging of the abdomen and pelvis was performed following the standard protocol without IV contrast. RADIATION DOSE REDUCTION: This exam was performed according to the departmental dose-optimization program which includes automated exposure control, adjustment of the mA and/or kV according to patient size and/or use of iterative reconstruction technique. COMPARISON:  CT 05/26/2023 FINDINGS: Lower chest: Marked cardiomegaly with small pericardial effusion, maximum thickness of 2.2 cm along the right cardiac border. Diffuse hazy pulmonary density at the bases suggesting mild edema. Atelectasis at the left base. Hepatobiliary: No focal liver  abnormality is seen. No gallstones, gallbladder wall thickening, or biliary dilatation. Pancreas: Unremarkable. No pancreatic ductal dilatation or surrounding inflammatory changes. Spleen: Normal in size without focal abnormality. Adrenals/Urinary Tract: Adrenal glands are unremarkable. Kidneys are normal, without renal calculi, focal lesion, or hydronephrosis. Bladder is unremarkable. Stomach/Bowel: The stomach is nonenlarged. No dilated small bowel. Radiopaque material in the colon. Mild wall thickening of the descending colon with surrounding stranding, consistent with mild colitis, coronal series 603, image 65 through 77. Vascular/Lymphatic: Mild renal artery and SMA calcifications. No aneurysm. No suspicious lymph nodes Reproductive: Uterus and bilateral adnexa are unremarkable. Other: No free air. Small volume free fluid within the pelvis and abdomen Musculoskeletal: No acute osseous abnormality IMPRESSION: 1. Mild wall thickening of the descending colon with surrounding stranding consistent with mild colitis of infectious or  inflammatory etiology. 2. Marked cardiomegaly with small pericardial effusion. Hazy pulmonary density at the bases suggesting mild edema. Small volume free fluid within the abdomen and pelvis. Electronically Signed   By: Jasmine Pang M.D.   On: 06/05/2023 21:41     Scheduled Meds:  amitriptyline  50 mg Oral QHS   apixaban  2.5 mg Oral BID   atorvastatin  40 mg Oral QPM   carvedilol  25 mg Oral BID WC   hydrALAZINE  100 mg Oral BID   hydrOXYzine  10 mg Oral Daily   insulin aspart  0-6 Units Subcutaneous TID WC   insulin aspart  2 Units Subcutaneous TID WC   insulin pump   Subcutaneous Q4H   isosorbide mononitrate  30 mg Oral Daily   sacubitril-valsartan  1 tablet Oral BID   Continuous Infusions:   LOS: 1 day    Time spent:2min    Zannie Cove, MD [facility]   06/07/2023, 5:35 AM

## 2023-06-07 NOTE — Progress Notes (Signed)
Heart Failure Navigator Progress Note  Assessed for Heart & Vascular TOC clinic readiness.  Patient does not meet criteria due to ESRD bon hemodialysis.   Navigator will sign off at this time.   Rhae Hammock, BSN, Scientist, clinical (histocompatibility and immunogenetics) Only

## 2023-06-07 NOTE — Progress Notes (Signed)
  Echocardiogram 2D Echocardiogram has been performed.  Janalyn Harder 06/07/2023, 10:47 AM

## 2023-06-07 NOTE — Progress Notes (Addendum)
   Patient's nurse called and updated me that troponin has been resulted. -Per chart review patient initially has been admitted for evaluation for abdominal pain. -Initial EKG showing ST depression on lateral leads in the setting of elevated blood pressure.  Troponin has been trending down 438 >383>351.  Patient is chest pain-free.  Cardiology has been consulted by the admitting provider and did not recommended any full dose of anticoagulation at this time per admitting provider note. -Plan to monitor for development of any chest pain and continue to trend troponin. - Continue cardiac monitoring. - Pending echocardiogram in the a.m. -Per chart review patient has history of DVT of right IJ.  Resuming Eliquis 2.5 mg twice daily.   Tereasa Coop, MD [facility] 06/07/2023, 4:56 AM

## 2023-06-07 NOTE — Plan of Care (Signed)
  Problem: Education: Goal: Knowledge of risk factors and measures for prevention of condition will improve Outcome: Progressing   Problem: Skin Integrity: Goal: Risk for impaired skin integrity will decrease Outcome: Progressing   Problem: Skin Integrity: Goal: Risk for impaired skin integrity will decrease Outcome: Progressing

## 2023-06-07 NOTE — Inpatient Diabetes Management (Addendum)
Inpatient Diabetes Program Recommendations  AACE/ADA: New Consensus Statement on Inpatient Glycemic Control (2015)  Target Ranges:  Prepandial:   less than 140 mg/dL      Peak postprandial:   less than 180 mg/dL (1-2 hours)      Critically ill patients:  140 - 180 mg/dL   Lab Results  Component Value Date   GLUCAP 277 (H) 06/07/2023   HGBA1C 9.0 (H) 06/06/2023    Review of Glycemic Control  Latest Reference Range & Units 06/06/23 15:08 06/06/23 17:48 06/06/23 19:17 06/06/23 20:51 06/07/23 00:14 06/07/23 04:17 06/07/23 08:06  Glucose-Capillary 70 - 99 mg/dL 161 (H) 096 (H) 045 (H) 419 (H) 167 (H) 202 (H) 277 (H)  (H): Data is abnormally high  Diabetes history: DM1(does not make insulin.  Needs correction, basal and meal coverage)   Outpatient Diabetes medications:  OmniPod insulin pump with Dexcom CGM; insulin pump settings (0.5 units/hour (total basal 12 units per day), 1 unit per 8 grams carbs, 1 unit drops glucose 50 mg/dl)   Current orders for Inpatient glycemic control: Insulin pump for basal insulin (12 units/day), Novolog 0-6 units TID and 2 units TID with meals   Inpatient Diabetes Program Recommendations:    Please consider increasing meal coverage:  Novolog 5 units TID with meals if she consumes at least 50%.  Family is supposed to bring in Omnipod PDM and supplies today.  She is only receiving basal through her pump at this time because she does not have her PDM which is how she boluses.    Addendum @ 13:52-  Met with patient at bedside.  She is still waiting for someone to bring her insulin pump supplies.  If he pump expires or runs out of insulin prior to her supplies being delivered, please consider:  Semglee 10 units every day once pump runs out.    Will continue to follow while inpatient.  Thank you, Dulce Sellar, MSN, CDCES Diabetes Coordinator Inpatient Diabetes Program [phone] (team pager from 8a-5p)

## 2023-06-08 DIAGNOSIS — I5023 Acute on chronic systolic (congestive) heart failure: Secondary | ICD-10-CM | POA: Diagnosis not present

## 2023-06-08 LAB — CBC
HCT: 35.6 % — ABNORMAL LOW (ref 36.0–46.0)
Hemoglobin: 11.8 g/dL — ABNORMAL LOW (ref 12.0–15.0)
MCH: 32.2 pg (ref 26.0–34.0)
MCHC: 33.1 g/dL (ref 30.0–36.0)
MCV: 97.3 fL (ref 80.0–100.0)
Platelets: 178 10*3/uL (ref 150–400)
RBC: 3.66 MIL/uL — ABNORMAL LOW (ref 3.87–5.11)
RDW: 15 % (ref 11.5–15.5)
WBC: 3.4 10*3/uL — ABNORMAL LOW (ref 4.0–10.5)
nRBC: 0 % (ref 0.0–0.2)

## 2023-06-08 LAB — GLUCOSE, CAPILLARY
Glucose-Capillary: 147 mg/dL — ABNORMAL HIGH (ref 70–99)
Glucose-Capillary: 170 mg/dL — ABNORMAL HIGH (ref 70–99)
Glucose-Capillary: 178 mg/dL — ABNORMAL HIGH (ref 70–99)
Glucose-Capillary: 216 mg/dL — ABNORMAL HIGH (ref 70–99)
Glucose-Capillary: 222 mg/dL — ABNORMAL HIGH (ref 70–99)
Glucose-Capillary: 233 mg/dL — ABNORMAL HIGH (ref 70–99)

## 2023-06-08 LAB — COMPREHENSIVE METABOLIC PANEL
ALT: 15 U/L (ref 0–44)
AST: 17 U/L (ref 15–41)
Albumin: 2.9 g/dL — ABNORMAL LOW (ref 3.5–5.0)
Alkaline Phosphatase: 76 U/L (ref 38–126)
Anion gap: 12 (ref 5–15)
BUN: 36 mg/dL — ABNORMAL HIGH (ref 6–20)
CO2: 26 mmol/L (ref 22–32)
Calcium: 9 mg/dL (ref 8.9–10.3)
Chloride: 97 mmol/L — ABNORMAL LOW (ref 98–111)
Creatinine, Ser: 6.25 mg/dL — ABNORMAL HIGH (ref 0.44–1.00)
GFR, Estimated: 9 mL/min — ABNORMAL LOW (ref >60)
Glucose, Bld: 217 mg/dL — ABNORMAL HIGH (ref 70–99)
Potassium: 4.1 mmol/L (ref 3.5–5.1)
Sodium: 135 mmol/L (ref 135–145)
Total Bilirubin: 0.9 mg/dL (ref 0.3–1.2)
Total Protein: 6 g/dL — ABNORMAL LOW (ref 6.5–8.1)

## 2023-06-08 LAB — HEPATITIS B SURFACE ANTIBODY, QUANTITATIVE: Hep B S AB Quant (Post): 769 m[IU]/mL

## 2023-06-08 MED ORDER — FENTANYL 25 MCG/HR TD PT72
1.0000 | MEDICATED_PATCH | TRANSDERMAL | Status: DC
  Administered 2023-06-08: 1 via TRANSDERMAL
  Filled 2023-06-08: qty 1

## 2023-06-08 MED ORDER — CHLORHEXIDINE GLUCONATE CLOTH 2 % EX PADS
6.0000 | MEDICATED_PAD | Freq: Every day | CUTANEOUS | Status: DC
  Administered 2023-06-09 – 2023-06-10 (×2): 6 via TOPICAL

## 2023-06-08 NOTE — TOC Initial Note (Signed)
Transition of Care [facility]) - Initial/Assessment Note    Patient Details  Name: Kathy Lewis MRN: [id number] Date of Birth: 1995/09/04  Transition of Care [facility]) CM/SW Contact:    Leone Haven, RN Phone Number: 06/08/2023, 4:10 PM  Clinical Narrative:                 From home with parents, has PCP and insurance on file, states has no HH services in place at this time or DME at home.  States family member will transport them home at [employer] and family is support system, states gets medications from [facility] on [address]  Pta self ambulatory . Patient states that her dialysis center no longer accepts her new insurance, so she will need a new dialysis center and new nephrology doctor.  NCM informed renal navigator , French Ana of this information, she states she will call patient's dialysis center to look into this further.  She may need to be clipped for new OP HD center.  Expected Discharge Plan: Home/Self Care Barriers to Discharge: Continued Medical Work up   Patient Goals and CMS Choice Patient states their goals for this hospitalization and ongoing recovery are:: return home   Choice offered to / list presented to : NA      Expected Discharge Plan and Services In-house Referral: NA Discharge Planning Services: CM Consult Post Acute Care Choice: NA Living arrangements for the past 2 months: Single Family Home                 DME Arranged: N/A DME Agency: NA       HH Arranged: NA          Prior Living Arrangements/Services Living arrangements for the past 2 months: Single Family Home Lives with:: Parents Patient language and need for interpreter reviewed:: Yes Do you feel safe going back to the place where you live?: Yes      Need for Family Participation in Patient Care: Yes (Comment) Care giver support system in place?: Yes (comment)   Criminal Activity/Legal Involvement Pertinent to Current Situation/Hospitalization: No - Comment as needed  Activities of  Daily Living   ADL Screening (condition at time of admission) Independently performs ADLs?: Yes (appropriate for developmental age) Is the patient deaf or have difficulty hearing?: No Does the patient have difficulty seeing, even when wearing glasses/contacts?: No Does the patient have difficulty concentrating, remembering, or making decisions?: No  Permission Sought/Granted Permission sought to share information with : Case Manager Permission granted to share information with : Yes, Verbal Permission Granted              Emotional Assessment Appearance:: Appears stated age Attitude/Demeanor/Rapport: Engaged Affect (typically observed): Appropriate Orientation: : Oriented to Self, Oriented to Place, Oriented to  Time, Oriented to Situation Alcohol / Substance Use: Not Applicable Psych Involvement: No (comment)  Admission diagnosis:  Acute on chronic systolic heart failure (HCC) [I50.23] Colitis [K52.9] Generalized abdominal pain [R10.84] Elevated troponin [R79.89] Nausea and vomiting, unspecified vomiting type [R11.2] Patient Active Problem List   Diagnosis Date Noted   Syncope 05/27/2023   Elevated troponin 05/27/2023   Internal jugular vein thrombosis, right (HCC) 02/20/2023   COVID-19 virus infection 10/21/2022   Seizure-like activity (HCC) 10/04/2022   Intractable vomiting with nausea 10/04/2022   Cyclical vomiting with nausea 09/12/2022   Acute systolic heart failure (HCC) 09/12/2022   Acute on chronic systolic heart failure (HCC) 09/12/2022   Metabolic acidosis, increased anion gap 07/28/2022   Elevated LFTs  07/28/2022   Hyperglycemia 06/30/2022   DKA (diabetic ketoacidosis) (HCC) 06/28/2022   Hypertensive urgency 06/27/2022   Type 1 diabetes (HCC) 06/26/2022   Nausea and vomiting 03/15/2022   Mixed diabetic hyperlipidemia associated with type 1 diabetes mellitus (HCC) 01/31/2022   Intractable hiccups 01/31/2022   Abdominal pain with vomiting 01/24/2022    Uncontrolled type 1 diabetes mellitus with hyperglycemia, with long-term current use of insulin (HCC) 01/24/2022   ESRD on dialysis (HCC) 11/23/2021   Abnormal CT of the chest 11/23/2021   Gastroparesis 11/22/2021   Pulmonary embolism (HCC) 11/22/2021   Hyperkalemia 08/11/2021   Bipolar 2 disorder (HCC)    Essential hypertension    Chronic HFrEF (heart failure with reduced ejection fraction) (HCC) 07/11/2020   Hypoalbuminemia    Anxiety    Normocytic anemia    Prolonged QT interval    Dyslipidemia 05/18/2020   Preeclampsia 10/15/2019   Hypokalemia    Noncompliance with medications 12/18/2014   Intractable nausea and vomiting 12/18/2014   PCP:  Loura Pardon, PA Pharmacy:   [facility] [address] [place] - [address] [place] [address] Phone: [phone] Fax: [phone]  [id number] [place], [place] - [address] AT [facility] OF [address] [place] [address] Phone: [phone] Fax: [phone]     Social Determinants of Health (SDOH) Social History: SDOH Screenings   Food Insecurity: No Food Insecurity (06/06/2023)  Housing: Low Risk  (10/04/2022)  Transportation Needs: Unknown (05/31/2023)   Received from [facility]  Recent Concern: Transportation Needs - Unmet Transportation Needs (04/14/2023)   Received from [facility]  Utilities: Unknown (05/31/2023)   Received from [facility]  Depression (PHQ2-9): High Risk (08/30/2022)  Financial Resource Strain: Low Risk  (04/19/2023)   Received from [facility]  Physical Activity: Unknown (04/19/2023)   Received from [facility]  Social Connections: Moderately Integrated (04/19/2023)   Received from [facility]  Stress: No Stress Concern Present (04/19/2023)   Received from [facility]  Tobacco Use: Medium Risk (06/05/2023)   SDOH Interventions:     Readmission Risk  Interventions    06/08/2023    4:08 PM 01/31/2022   11:13 AM 08/12/2021    1:11 PM  Readmission Risk Prevention Plan  Transportation Screening Complete Complete Complete  Medication Review Oceanographer) Complete Referral to Pharmacy Complete  PCP or Specialist appointment within 3-5 days of discharge  Complete Complete  HRI or Home Care Consult Complete Complete Complete  SW Recovery Care/Counseling Consult  Complete Complete  Palliative Care Screening Not Applicable Not Applicable Not Applicable  Skilled Nursing Facility Not Applicable Not Applicable Not Applicable

## 2023-06-08 NOTE — Progress Notes (Signed)
   06/07/23 2305  Vitals  Temp 98.4 F (36.9 C)  Temp Source Oral  BP (!) 157/100  MAP (mmHg) 93  BP Location Right Arm  BP Method Automatic  Patient Position (if appropriate) Lying  Pulse Rate 92  Pulse Rate Source Monitor  ECG Heart Rate 92  Resp 14  Oxygen Therapy  SpO2 100 %  O2 Device Nasal Cannula  O2 Flow Rate (L/min) 2 L/min  During Treatment Monitoring  Blood Flow Rate (mL/min) 349 mL/min  Arterial Pressure (mmHg) -149.89 mmHg  Venous Pressure (mmHg) 172.92 mmHg  TMP (mmHg) 9.29 mmHg  Ultrafiltration Rate (mL/min) 841 mL/min  Dialysate Flow Rate (mL/min) 299 ml/min  Dialysate Potassium Concentration 2  Dialysate Calcium Concentration 2.5  Duration of HD Treatment -hour(s) 2.97 hour(s)  Cumulative Fluid Removed (mL) per Treatment  1975.7  Intra-Hemodialysis Comments Tx completed  Post Treatment  Dialyzer Clearance Lightly streaked  Liters Processed 70.3  Fluid Removed (mL) 2000 mL  Post-Hemodialysis Comments Pt c/o intermiting lower back pain.  AVG/AVF Arterial Site Held (minutes) 10 minutes  AVG/AVF Venous Site Held (minutes) 10 minutes  Note  Patient Observations  (Oxtcodone 5mg  & fentanyl  25 mcg given during tx for lower back pain)  Hemodialysis Catheter Right Internal jugular Double lumen Permanent (Tunneled)  Placement Date/Time: 09/14/22 1520   Placed prior to admission: No  Serial / Lot #: [id number]  Expiration Date: 05/31/27  Time Out: Correct patient;Correct site;Correct procedure  Maximum sterile barrier precautions: Hand hygiene;Cap;Mask;Sterile gow...  Site Condition No complications  Blue Lumen Status Heparin locked  Red Lumen Status Heparin locked  Catheter fill solution Heparin 1000 units/ml  Catheter fill volume (Arterial) 1.6 cc  Catheter fill volume (Venous) 1.6  Dressing Type Transparent  Dressing Status Clean, Dry, Intact;Antimicrobial disc in place  Drainage Description None  Dressing Change Due 06/10/23  Post treatment catheter  status Capped and Clamped   Pt back pain decline post treatment.net Fluid removal . Hand off to Sylvester. Mariel Aloe.

## 2023-06-08 NOTE — Progress Notes (Signed)
Contacted regarding pt's possible need for new out-pt HD clinic due to new insurance. Contacted [facility] and confirmed they do not accept pt's insurance but they had talked to pt about changing insurance back to previous plan. Spoke to pt and she does not desire to change insurance plans and does not desire to resume at current clinic. Clinic advised navigator that pt can resume at clinic and they can try to apply for special gap coverage to allow pt not to have any financial responsibility due to new insurance plan if needed. Advised clinic staff that pt is requesting new clinic placement in [place]. Pt prefers [facility] [place] if possible. Referral submitted to [facility] admissions this afternoon for review. Pt and renal NP advised that there will be no updates from [facility] until at least Monday. Will assist as needed.   Olivia Canter Renal Navigator [phone]

## 2023-06-08 NOTE — Progress Notes (Addendum)
PROGRESS NOTE    Kathy Lewis  WJX:[id number] DOB: 1995-09-23 DOA: 06/05/2023 PCP: Kathy Pardon, PA  27/F w ESRD TTS, type 1 diabetes mellitus on insulin drip, and chronic systolic heart failure with most recent LVEF 15 to 20%, bipolar d/o who presented to [facility] ED complaining of N/V and abdominal pain, which started during her hemodialysis session.  Patient states she has had intermittent  abdominal pain x 1 year . Patient had EGD/Colo 03/2022 which were normal. Gastric emptying study was also normal. GI suspected cyclical abd pain/N/V and possible element of abdominal congestion from heart failure. -ED workup w/  elevated cardiac enzyme cardiology Dr. Piedad Climes,  was consulted and recommend admission to hospitalist service for further evaluation.  ED Course: bp 178/116 repeat 142/79, hr 105, rr 24,  troponin 598, with repeat value trending down to 492. -EKG -nsr,  st depression lateral leads  -CT abdomen/pelvis with mild thickening of descending colon, cardiomegaly, small pericardial effusion and small volume of free fluid in abdomen pelvis  Subjective: Continues to have frequent bouts of abdominal pain, only relieved by fentanyl, ongoing for about a year now, has seen GI multiple times  Assessment and Plan:  Elevated troponin -Likely from demand, insetting of periods of uncontrolled hypertension  -Clinically do not suspect ACS, troponin trending down  Acute on chronic abdominal pain -Longstanding issue for over a year, gets hospitalized multiple times a month usually at [facility] -GI workup with EGD colonoscopy last year were unremarkable, gastric emptying scan was also largely normal 6/23 -GI felt her symptoms could be from congestion related to CHF, volume overload, now dependent on dialysis -Clinically do not suspect colitis -Fentanyl is the only medication that helps her symptoms, will attempt low-dose fentanyl patch, discussed risks benefits, suggested this for 1 to 2 months  only -Continue supportive care, strongly suggested elimination diet of dairy products, artificial sweeteners, processed foods and if no improvement to attempt gluten elimination as well   Acute on Chronic systolic CHF -ECHO 9/24 w/ EF 21-30, grade 3DD, mod reduced RV, mod MR -HD for fluid management -Hold Aldactone and Entresto with hyperkalemia  Right atrial mobile density -3X 0.9 cm, reportedly seen on echos this earlier this year -Patient reports that this was seen in [facility] and she was started on Eliquis, however I do not see evidence of such in Care Everywhere -d/w Cards today, recommended to continue Eliquis  ESRD on HD TTS -Nephrology consulted, -Attempt to decrease dry weight as tolerated   Hyperkalemia  -Hold Aldactone and Entresto, now resolved, treated with 2 doses of Lokelma   Hypertension -uncontrolled on admit to ED -improved now  -resume home regimen    DMII, with hyperglycemia  - last A1c  -continue on insulin pump   Bipolar d/o  -resume home regimen    Prolonged QT syndrome  -currently borderline at 489 -continue to monitor     Anemia  stable   OSA -O2 at bedtime    Hx DVT of R IJ vein -Cont home eliquis 2.5mg  BID indefinitely    DVT prophylaxis: apixaban Code Status: Full Code Family Communication: None present Disposition Plan: Home likely 1 to 2 days  Consultants:    Procedures:   Antimicrobials:    Objective: Vitals:   06/07/23 2305 06/07/23 2355 06/08/23 0456 06/08/23 0812  BP: (!) 157/100 (!) 160/104 121/81 (!) 125/92  Pulse: 92  77 80  Resp: 14 20 18 19   Temp: 98.4 F (36.9 C) 98.3 F (36.8 C) 98.5 F (36.9  C) 98.5 F (36.9 C)  TempSrc: Oral Oral Oral Oral  SpO2: 100%  90% 95%  Weight:   57.7 kg   Height:        Intake/Output Summary (Last 24 hours) at 06/08/2023 1140 Last data filed at 06/07/2023 2305 Gross per 24 hour  Intake 240 ml  Output 2000 ml  Net -1760 ml   Filed Weights   06/05/23 2013 06/07/23 0414  06/08/23 0456  Weight: 54.4 kg 59.1 kg 57.7 kg    Examination:  Gen: Awake, Alert, Oriented X 3,  HEENT: no JVD Lungs: Decreased breath sounds to bases CVS: S1S2/RRR Abd: soft, Non tender, non distended, BS present Extremities: No edema Skin: no new rashes on exposed skin     Data Reviewed:   CBC: Recent Labs  Lab 06/05/23 2014 06/06/23 1558 06/07/23 0325 06/07/23 0920 06/08/23 0634  WBC 3.6* 3.4* 3.5* 2.3* 3.4*  NEUTROABS  --  1.8  --   --   --   HGB 13.0 13.2 11.4* 12.4 11.8*  HCT 40.5 39.8 35.4* 38.8 35.6*  MCV 98.8 100.3* 99.2 99.7 97.3  PLT 180 171 190 PLATELET CLUMPS NOTED ON SMEAR, UNABLE TO ESTIMATE 178   Basic Metabolic Panel: Recent Labs  Lab 06/06/23 1731 06/06/23 2009 06/06/23 2313 06/07/23 0325 06/07/23 0920 06/07/23 1056 06/08/23 0634  NA 135 132*  --  136 133*  --  135  K 6.1* 5.3* 4.8 5.1 5.7* 5.8* 4.1  CL 103 98  --  99 99  --  97*  CO2 18* 21*  --  24 20*  --  26  GLUCOSE 332* 479*  --  185* 313*  --  217*  BUN 50* 50*  --  55* 66*  --  36*  CREATININE 7.60* 7.87*  --  9.22* 9.46*  --  6.25*  CALCIUM 9.4 9.0  --  9.1 9.2  --  9.0  MG 2.5*  --   --   --   --   --   --   PHOS 5.0*  --   --   --   --   --   --    GFR: Estimated Creatinine Clearance: 10.8 mL/min (A) (by C-G formula based on SCr of 6.25 mg/dL (H)). Liver Function Tests: Recent Labs  Lab 06/05/23 2014 06/06/23 1731 06/07/23 0325 06/07/23 0920 06/08/23 0634  AST 21 24 14* 17 17  ALT 15 14 13 14 15   ALKPHOS 93 84 77 82 76  BILITOT 0.8 1.1 0.6 0.8 0.9  PROT 7.4 6.9 6.2* 6.6 6.0*  ALBUMIN 3.7 3.3* 3.0* 3.2* 2.9*   Recent Labs  Lab 06/05/23 2014  LIPASE 29   No results for input(s): "AMMONIA" in the last 168 hours. Coagulation Profile: No results for input(s): "INR", "PROTIME" in the last 168 hours. Cardiac Enzymes: No results for input(s): "CKTOTAL", "CKMB", "CKMBINDEX", "TROPONINI" in the last 168 hours. BNP (last 3 results) No results for input(s): "PROBNP"  in the last 8760 hours. HbA1C: Recent Labs    06/06/23 1558  HGBA1C 9.0*   CBG: Recent Labs  Lab 06/07/23 1605 06/08/23 0003 06/08/23 0458 06/08/23 0810 06/08/23 1112  GLUCAP 155* 233* 222* 216* 170*   Lipid Profile: No results for input(s): "CHOL", "HDL", "LDLCALC", "TRIG", "CHOLHDL", "LDLDIRECT" in the last 72 hours. Thyroid Function Tests: No results for input(s): "TSH", "T4TOTAL", "FREET4", "T3FREE", "THYROIDAB" in the last 72 hours. Anemia Panel: No results for input(s): "VITAMINB12", "FOLATE", "FERRITIN", "TIBC", "IRON", "RETICCTPCT" in the last  72 hours. Urine analysis:    Component Value Date/Time   COLORURINE YELLOW 05/28/2023 1440   APPEARANCEUR HAZY (A) 05/28/2023 1440   LABSPEC 1.013 05/28/2023 1440   PHURINE 5.0 05/28/2023 1440   GLUCOSEU >=500 (A) 05/28/2023 1440   HGBUR SMALL (A) 05/28/2023 1440   BILIRUBINUR NEGATIVE 05/28/2023 1440   KETONESUR NEGATIVE 05/28/2023 1440   PROTEINUR >=300 (A) 05/28/2023 1440   UROBILINOGEN 0.2 12/15/2015 1736   NITRITE NEGATIVE 05/28/2023 1440   LEUKOCYTESUR NEGATIVE 05/28/2023 1440   Sepsis Labs: @LABRCNTIP (procalcitonin:4,lacticidven:4)  )No results found for this or any previous visit (from the past 240 hour(s)).   Radiology Studies: ECHOCARDIOGRAM LIMITED  Result Date: 06/07/2023    ECHOCARDIOGRAM LIMITED REPORT   Patient Name:   Kathy Lewis Date of Exam: 06/07/2023 Medical Rec #:  [id number]     Height:       60.0 in Accession #:    [id number]    Weight:       130.3 lb Date of Birth:  14-Jan-1996     BSA:          1.556 m Patient Age:    27 years      BP:           131/96 mmHg Patient Gender: F             HR:           88 bpm. Exam Location:  Inpatient Procedure: Limited Echo, Cardiac Doppler, Color Doppler and 3D Echo Indications:    122-I22.9 Subsequent ST elevation (STEM) and non-ST elevation                 (NSTEMI) myocardial infarction  History:        Patient has prior history of Echocardiogram examinations,  most                 recent 05/07/2023. CHF, Signs/Symptoms:Syncope and Chest Pain;                 Risk Factors:Hypertension, Diabetes and Dyslipidemia. ESRD.                 Pulmonary embolus.  Sonographer:    Sheralyn Boatman RDCS Referring Phys: [id number] SUBRINA SUNDIL IMPRESSIONS  1. Left ventricular ejection fraction, by estimation, is 20 to 25%. The left ventricle has severely decreased function.  2. A small pericardial effusion is present. There is no evidence of cardiac tamponade.  3. Mild mitral valve regurgitation.  4. Tricuspid valve regurgitation is moderate to severe.  5. The aortic valve is tricuspid.  6. There is moderately elevated pulmonary artery systolic pressure.  7. There is a linear mobile density in the right atrium measuring 3.1 x 0.9 cm. On review of prior studies, it was present 04/2023 and 2/204 but not on prior echo studies. May represent thrombin sheath from prior line insertion. No evidence of endocarditis. Thrombus much less likely. FINDINGS  Left Ventricle: Left ventricular ejection fraction, by estimation, is 20 to 25%. The left ventricle has severely decreased function. Right Ventricle: There is moderately elevated pulmonary artery systolic pressure. The tricuspid regurgitant velocity is 2.88 m/s, and with an assumed right atrial pressure of 15 mmHg, the estimated right ventricular systolic pressure is 48.2 mmHg. Right Atrium: There is a linear mobile density in the right atrium measuring 3.1 x 0.9 cm. On review of prior studies, it was present 04/2023 and 2/204 but not on prior echo studies. May represent thrombin sheath from prior line insertion. No  evidence of endocarditis. Thrombus much less likely. Pericardium: A small pericardial effusion is present. There is excessive respiratory variation in the tricuspid valve spectral Doppler velocities. There is no evidence of cardiac tamponade. Mitral Valve: Mild mitral valve regurgitation. MV peak gradient, 7.0 mmHg. The mean mitral valve gradient  is 3.0 mmHg. Tricuspid Valve: Tricuspid valve regurgitation is moderate to severe. Aortic Valve: The aortic valve is tricuspid. Additional Comments: Spectral Doppler performed. Color Doppler performed.   LV Volumes (MOD) LV vol d, MOD A2C: 154.0 ml LV vol d, MOD A4C: 159.0 ml LV vol s, MOD A2C: 113.0 ml LV vol s, MOD A4C: 124.0 ml LV SV MOD A2C:     41.0 ml  3D Volume EF: LV SV MOD A4C:     159.0 ml 3D EF:        27 % LV SV MOD BP:      36.5 ml  LV EDV:       167 ml                             LV ESV:       122 ml                             LV SV:        44 ml IVC IVC diam: 2.60 cm AORTIC VALVE LVOT Vmax:   119.00 cm/s LVOT Vmean:  80.400 cm/s LVOT VTI:    0.208 m  AORTA Ao Asc diam: 3.00 cm MITRAL VALVE            TRICUSPID VALVE MV Peak grad: 7.0 mmHg  TR Peak grad:   33.2 mmHg MV Mean grad: 3.0 mmHg  TR Vmax:        288.00 cm/s MV Vmax:      1.32 m/s MV Vmean:     83.0 cm/s SHUNTS                         Systemic VTI: 0.21 m Chilton Si MD Electronically signed by Chilton Si MD Signature Date/Time: 06/07/2023/2:39:00 PM    Final      Scheduled Meds:  amitriptyline  50 mg Oral QHS   apixaban  2.5 mg Oral BID   atorvastatin  40 mg Oral QPM   calcium acetate  1,334 mg Oral TID WC   carvedilol  25 mg Oral BID WC   Chlorhexidine Gluconate Cloth  6 each Topical Q0600   clonazepam  0.25 mg Oral BID   fentaNYL  1 patch Transdermal Q72H   hydrALAZINE  100 mg Oral BID   hydrOXYzine  10 mg Oral Daily   insulin pump   Subcutaneous Q4H   isosorbide mononitrate  30 mg Oral Daily   multivitamin  1 tablet Oral QHS   pantoprazole  40 mg Oral Daily   Continuous Infusions:   LOS: 2 days    Time spent:34min    Zannie Cove, MD [facility]   06/08/2023, 11:40 AM

## 2023-06-08 NOTE — Progress Notes (Addendum)
[facility] Progress Note   Subjective:    Seen and examined patient at bedside. Tolerated HD overnight with net UF 2L. Spoke with case Production designer, theatre/television/film. Patient recently changed insurance which is not contracted with her current outpatient HD center. Patient may need to be clipped to a center in this area. Renal navigator currently working on this. She denies SOB, CP, and N/V. Next HD 10/12.  Objective Vitals:   06/07/23 2355 06/08/23 0456 06/08/23 0812 06/08/23 1114  BP: (!) 160/104 121/81 (!) 125/92 106/80  Pulse:  77 80 80  Resp: 20 18 19 18   Temp: 98.3 F (36.8 C) 98.5 F (36.9 C) 98.5 F (36.9 C) 98.5 F (36.9 C)  TempSrc: Oral Oral Oral Oral  SpO2:  90% 95% 95%  Weight:  57.7 kg    Height:       Physical Exam General: Awake, alert, pleasant, NAD, on RA Heart: S1 and S2; No MRGs Lungs: Clear throughout; No wheezing, rales, or rhonchi Abdomen: Soft and non-tender Extremities: No LE edema Dialysis Access: [facility]   Filed Weights   06/05/23 2013 06/07/23 0414 06/08/23 0456  Weight: 54.4 kg 59.1 kg 57.7 kg    Intake/Output Summary (Last 24 hours) at 06/08/2023 1613 Last data filed at 06/07/2023 2305 Gross per 24 hour  Intake 240 ml  Output 2000 ml  Net -1760 ml    Additional Objective Labs: Basic Metabolic Panel: Recent Labs  Lab 06/06/23 1731 06/06/23 2009 06/07/23 0325 06/07/23 0920 06/07/23 1056 06/08/23 0634  NA 135   < > 136 133*  --  135  K 6.1*   < > 5.1 5.7* 5.8* 4.1  CL 103   < > 99 99  --  97*  CO2 18*   < > 24 20*  --  26  GLUCOSE 332*   < > 185* 313*  --  217*  BUN 50*   < > 55* 66*  --  36*  CREATININE 7.60*   < > 9.22* 9.46*  --  6.25*  CALCIUM 9.4   < > 9.1 9.2  --  9.0  PHOS 5.0*  --   --   --   --   --    < > = values in this interval not displayed.   Liver Function Tests: Recent Labs  Lab 06/07/23 0325 06/07/23 0920 06/08/23 0634  AST 14* 17 17  ALT 13 14 15   ALKPHOS 77 82 76  BILITOT 0.6 0.8 0.9  PROT 6.2* 6.6 6.0*   ALBUMIN 3.0* 3.2* 2.9*   Recent Labs  Lab 06/05/23 2014  LIPASE 29   CBC: Recent Labs  Lab 06/05/23 2014 06/06/23 1558 06/07/23 0325 06/07/23 0920 06/08/23 0634  WBC 3.6* 3.4* 3.5* 2.3* 3.4*  NEUTROABS  --  1.8  --   --   --   HGB 13.0 13.2 11.4* 12.4 11.8*  HCT 40.5 39.8 35.4* 38.8 35.6*  MCV 98.8 100.3* 99.2 99.7 97.3  PLT 180 171 190 PLATELET CLUMPS NOTED ON SMEAR, UNABLE TO ESTIMATE 178   Blood Culture    Component Value Date/Time   SDES BLOOD LEFT HAND 10/20/2022 1837   SPECREQUEST  10/20/2022 1837    BOTTLES DRAWN AEROBIC AND ANAEROBIC Blood Culture results may not be optimal due to an inadequate volume of blood received in culture bottles   CULT  10/20/2022 1837    NO GROWTH 6 DAYS Performed at [facility], [address] [place], [place] [address]  REPTSTATUS 10/26/2022 FINAL 10/20/2022 1837    Cardiac Enzymes: No results for input(s): "CKTOTAL", "CKMB", "CKMBINDEX", "TROPONINI" in the last 168 hours. CBG: Recent Labs  Lab 06/08/23 0003 06/08/23 0458 06/08/23 0810 06/08/23 1112 06/08/23 1601  GLUCAP 233* 222* 216* 170* 178*   Iron Studies: No results for input(s): "IRON", "TIBC", "TRANSFERRIN", "FERRITIN" in the last 72 hours. Lab Results  Component Value Date   INR 1.0 10/22/2019   INR 1.0 10/22/2019   Studies/Results: ECHOCARDIOGRAM LIMITED  Result Date: 06/07/2023    ECHOCARDIOGRAM LIMITED REPORT   Patient Name:   Kathy Lewis Date of Exam: 06/07/2023 Medical Rec #:  [id number]     Height:       60.0 in Accession #:    [id number]    Weight:       130.3 lb Date of Birth:  09-23-1995     BSA:          1.556 m Patient Age:    27 years      BP:           131/96 mmHg Patient Gender: F             HR:           88 bpm. Exam Location:  Inpatient Procedure: Limited Echo, Cardiac Doppler, Color Doppler and 3D Echo Indications:    122-I22.9 Subsequent ST elevation (STEM) and non-ST elevation                 (NSTEMI) myocardial infarction  History:         Patient has prior history of Echocardiogram examinations, most                 recent 05/07/2023. CHF, Signs/Symptoms:Syncope and Chest Pain;                 Risk Factors:Hypertension, Diabetes and Dyslipidemia. ESRD.                 Pulmonary embolus.  Sonographer:    Sheralyn Boatman RDCS Referring Phys: [id number] SUBRINA SUNDIL IMPRESSIONS  1. Left ventricular ejection fraction, by estimation, is 20 to 25%. The left ventricle has severely decreased function.  2. A small pericardial effusion is present. There is no evidence of cardiac tamponade.  3. Mild mitral valve regurgitation.  4. Tricuspid valve regurgitation is moderate to severe.  5. The aortic valve is tricuspid.  6. There is moderately elevated pulmonary artery systolic pressure.  7. There is a linear mobile density in the right atrium measuring 3.1 x 0.9 cm. On review of prior studies, it was present 04/2023 and 2/204 but not on prior echo studies. May represent thrombin sheath from prior line insertion. No evidence of endocarditis. Thrombus much less likely. FINDINGS  Left Ventricle: Left ventricular ejection fraction, by estimation, is 20 to 25%. The left ventricle has severely decreased function. Right Ventricle: There is moderately elevated pulmonary artery systolic pressure. The tricuspid regurgitant velocity is 2.88 m/s, and with an assumed right atrial pressure of 15 mmHg, the estimated right ventricular systolic pressure is 48.2 mmHg. Right Atrium: There is a linear mobile density in the right atrium measuring 3.1 x 0.9 cm. On review of prior studies, it was present 04/2023 and 2/204 but not on prior echo studies. May represent thrombin sheath from prior line insertion. No evidence of endocarditis. Thrombus much less likely. Pericardium: A small pericardial effusion is present. There is excessive respiratory variation in the tricuspid valve spectral Doppler velocities. There is no  evidence of cardiac tamponade. Mitral Valve: Mild mitral valve  regurgitation. MV peak gradient, 7.0 mmHg. The mean mitral valve gradient is 3.0 mmHg. Tricuspid Valve: Tricuspid valve regurgitation is moderate to severe. Aortic Valve: The aortic valve is tricuspid. Additional Comments: Spectral Doppler performed. Color Doppler performed.   LV Volumes (MOD) LV vol d, MOD A2C: 154.0 ml LV vol d, MOD A4C: 159.0 ml LV vol s, MOD A2C: 113.0 ml LV vol s, MOD A4C: 124.0 ml LV SV MOD A2C:     41.0 ml  3D Volume EF: LV SV MOD A4C:     159.0 ml 3D EF:        27 % LV SV MOD BP:      36.5 ml  LV EDV:       167 ml                             LV ESV:       122 ml                             LV SV:        44 ml IVC IVC diam: 2.60 cm AORTIC VALVE LVOT Vmax:   119.00 cm/s LVOT Vmean:  80.400 cm/s LVOT VTI:    0.208 m  AORTA Ao Asc diam: 3.00 cm MITRAL VALVE            TRICUSPID VALVE MV Peak grad: 7.0 mmHg  TR Peak grad:   33.2 mmHg MV Mean grad: 3.0 mmHg  TR Vmax:        288.00 cm/s MV Vmax:      1.32 m/s MV Vmean:     83.0 cm/s SHUNTS                         Systemic VTI: 0.21 m Chilton Si MD Electronically signed by Chilton Si MD Signature Date/Time: 06/07/2023/2:39:00 PM    Final     Medications:   amitriptyline  50 mg Oral QHS   apixaban  2.5 mg Oral BID   atorvastatin  40 mg Oral QPM   calcium acetate  1,334 mg Oral TID WC   carvedilol  25 mg Oral BID WC   Chlorhexidine Gluconate Cloth  6 each Topical Q0600   clonazepam  0.25 mg Oral BID   fentaNYL  1 patch Transdermal Q72H   hydrALAZINE  100 mg Oral BID   hydrOXYzine  10 mg Oral Daily   insulin pump   Subcutaneous Q4H   isosorbide mononitrate  30 mg Oral Daily   multivitamin  1 tablet Oral QHS   pantoprazole  40 mg Oral Daily    Dialysis Orders: [facility] [address]  TTS 3.5h  300/600  TDC  Heparin 2000 + 500u/hr (needs updating, this from June 2024)    Renal-related home meds: - renavite - phoslo 2 ac  - coreg 25 bid - eliquis 2.5 bid - hydralazine 100 bid - imdur 30  - entresto  bid  Assessment/Plan: Abdominal pain - w/ N/V. Per pmd ESRD - on HD TTS. Received HD overnight (2L off). Next HD 10/12. HTN- BP's are normal.  Volume - no vol excess on exam. Get records. UF 1.5- 2 L as tol.  Anemia esrd - Hb 13, no esa needs. Follow.  MBD ckd - CCa and phos are in range. Cont  phoslo as binder. Get records.  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 She may need to be clipped to a HD center in this area. Discussed with Renal Navigator who is working on this.  Salome Holmes, NP  [facility] 06/08/2023,4:13 PM  LOS: 2 days

## 2023-06-09 DIAGNOSIS — I5023 Acute on chronic systolic (congestive) heart failure: Secondary | ICD-10-CM | POA: Diagnosis not present

## 2023-06-09 LAB — CBC WITH DIFFERENTIAL/PLATELET
Abs Immature Granulocytes: 0 10*3/uL (ref 0.00–0.07)
Basophils Absolute: 0 10*3/uL (ref 0.0–0.1)
Basophils Relative: 1 %
Eosinophils Absolute: 0.1 10*3/uL (ref 0.0–0.5)
Eosinophils Relative: 3 %
HCT: 39.7 % (ref 36.0–46.0)
Hemoglobin: 13 g/dL (ref 12.0–15.0)
Immature Granulocytes: 0 %
Lymphocytes Relative: 26 %
Lymphs Abs: 0.7 10*3/uL (ref 0.7–4.0)
MCH: 31.4 pg (ref 26.0–34.0)
MCHC: 32.7 g/dL (ref 30.0–36.0)
MCV: 95.9 fL (ref 80.0–100.0)
Monocytes Absolute: 0.3 10*3/uL (ref 0.1–1.0)
Monocytes Relative: 11 %
Neutro Abs: 1.7 10*3/uL (ref 1.7–7.7)
Neutrophils Relative %: 59 %
Platelets: 176 10*3/uL (ref 150–400)
RBC: 4.14 MIL/uL (ref 3.87–5.11)
RDW: 14.8 % (ref 11.5–15.5)
WBC: 2.8 10*3/uL — ABNORMAL LOW (ref 4.0–10.5)
nRBC: 0 % (ref 0.0–0.2)

## 2023-06-09 LAB — COMPREHENSIVE METABOLIC PANEL
ALT: 14 U/L (ref 0–44)
AST: 19 U/L (ref 15–41)
Albumin: 3.4 g/dL — ABNORMAL LOW (ref 3.5–5.0)
Alkaline Phosphatase: 79 U/L (ref 38–126)
Anion gap: 17 — ABNORMAL HIGH (ref 5–15)
BUN: 48 mg/dL — ABNORMAL HIGH (ref 6–20)
CO2: 24 mmol/L (ref 22–32)
Calcium: 9.6 mg/dL (ref 8.9–10.3)
Chloride: 94 mmol/L — ABNORMAL LOW (ref 98–111)
Creatinine, Ser: 8.23 mg/dL — ABNORMAL HIGH (ref 0.44–1.00)
GFR, Estimated: 6 mL/min — ABNORMAL LOW (ref >60)
Glucose, Bld: 244 mg/dL — ABNORMAL HIGH (ref 70–99)
Potassium: 4.4 mmol/L (ref 3.5–5.1)
Sodium: 135 mmol/L (ref 135–145)
Total Bilirubin: 1 mg/dL (ref 0.3–1.2)
Total Protein: 6.7 g/dL (ref 6.5–8.1)

## 2023-06-09 LAB — GLUCOSE, CAPILLARY
Glucose-Capillary: 131 mg/dL — ABNORMAL HIGH (ref 70–99)
Glucose-Capillary: 179 mg/dL — ABNORMAL HIGH (ref 70–99)
Glucose-Capillary: 187 mg/dL — ABNORMAL HIGH (ref 70–99)
Glucose-Capillary: 189 mg/dL — ABNORMAL HIGH (ref 70–99)
Glucose-Capillary: 211 mg/dL — ABNORMAL HIGH (ref 70–99)
Glucose-Capillary: 235 mg/dL — ABNORMAL HIGH (ref 70–99)
Glucose-Capillary: 46 mg/dL — ABNORMAL LOW (ref 70–99)
Glucose-Capillary: 49 mg/dL — ABNORMAL LOW (ref 70–99)

## 2023-06-09 MED ORDER — DEXTROSE 50 % IV SOLN
INTRAVENOUS | Status: AC
  Filled 2023-06-09: qty 50

## 2023-06-09 MED ORDER — DICYCLOMINE HCL 20 MG PO TABS: 20.0000 mg | ORAL_TABLET | Freq: Two times a day (BID) | ORAL | 0 refills | Status: DC | PRN

## 2023-06-09 MED ORDER — PROCHLORPERAZINE EDISYLATE 10 MG/2ML IJ SOLN
5.0000 mg | Freq: Once | INTRAMUSCULAR | Status: AC
  Administered 2023-06-09: 5 mg via INTRAVENOUS
  Filled 2023-06-09: qty 2

## 2023-06-09 MED ORDER — GLUCAGON HCL RDNA (DIAGNOSTIC) 1 MG IJ SOLR
INTRAMUSCULAR | Status: AC
  Filled 2023-06-09: qty 1

## 2023-06-09 MED ORDER — HEPARIN SODIUM (PORCINE) 1000 UNIT/ML IJ SOLN
INTRAMUSCULAR | Status: AC
  Filled 2023-06-09: qty 5

## 2023-06-09 MED ORDER — DEXTROSE 50 % IV SOLN
25.0000 g | INTRAVENOUS | Status: AC
  Administered 2023-06-09: 25 g via INTRAVENOUS

## 2023-06-09 MED ORDER — FENTANYL 25 MCG/HR TD PT72: 1.0000 | MEDICATED_PATCH | TRANSDERMAL | 0 refills | Status: DC

## 2023-06-09 MED ORDER — SCOPOLAMINE 1 MG/3DAYS TD PT72
1.0000 | MEDICATED_PATCH | TRANSDERMAL | Status: DC
  Filled 2023-06-09: qty 1

## 2023-06-09 NOTE — Discharge Summary (Addendum)
Physician Discharge Summary  Kathy Lewis ZOX:[id number] DOB: 07-06-1996 DOA: 06/05/2023  PCP: Loura Pardon, PA  Admit date: 06/05/2023 Discharge date: 06/09/2023  Time spent: 45 minutes  Recommendations for Outpatient Follow-up:  Outpatient hemodialysis TTS Cardiology Dr. Gala Romney in 1 to 2 months Attempt to lower dry weight at HD as tolerated Continue to emphasize illumination diet, avoiding ultra processed foods etc.   Discharge Diagnoses:  Acute on chronic abdominal pain ESRD on hemodialysis Type 1 diabetes mellitus   Acute on chronic systolic heart failure (HCC) Elevated troponin Hyperkalemia Hypertension Right IJ DVT  Discharge Condition: Stable  Diet recommendation: DM, renal  Filed Weights   06/07/23 0414 06/08/23 0456 06/09/23 0506  Weight: 59.1 kg 57.7 kg 58.7 kg    History of present illness:  27/F w ESRD TTS, type 1 diabetes mellitus on insulin drip, and chronic systolic heart failure with most recent LVEF 15 to 20%, bipolar d/o who presented to [facility] ED complaining of N/V and abdominal pain, which started during her hemodialysis session.  Patient states she has had intermittent  abdominal pain x 1 year . Patient had EGD/Colo 03/2022 which were normal. Gastric emptying study was also normal. GI suspected cyclical abd pain/N/V and possible element of abdominal congestion from heart failure. -ED workup w/  elevated cardiac enzyme cardiology Dr. Piedad Climes,  was consulted and recommend admission to hospitalist service for further evaluation.  ED Course: bp 178/116 repeat 142/79, hr 105, rr 24,  troponin 598, with repeat value trending down to 492. -EKG -nsr,  st depression lateral leads  -CT abdomen/pelvis with mild thickening of descending colon, cardiomegaly, small pericardial effusion and small volume of free fluid in abdomen pelvis  Hospital Course:  Acute on chronic abdominal pain -Longstanding issue for over a year, gets hospitalized multiple times a month  usually at [facility] -GI workup with EGD colonoscopy last year were unremarkable, gastric emptying scan was also largely normal 6/23 -GI felt her symptoms could be from congestion related to CHF, volume overload, now dependent on dialysis -Clinically do not suspect colitis -Fentanyl is the only medication that helps her symptoms, will attempt low-dose fentanyl patch, discussed risks benefits, suggested this for 1 to 2 months only, PRN oxycodone discontinued in favor of low-dose fentanyl patch -Still with some symptoms but overall improving with supportive care, strongly suggested elimination diet of dairy products, artificial sweeteners, processed foods and if no improvement to attempt gluten elimination as well   Acute on Chronic systolic CHF -ECHO 9/24 w/ EF 81-19, grade 3DD, mod reduced RV, mod MR -HD for fluid management -Hold Aldactone and Entresto with hyperkalemia   Right atrial mobile density -3X 0.9 cm, reportedly seen on 2 echos this earlier this year -Patient reports that this was seen in [facility] and she was started on Eliquis -d/w her Cardiologist with CHF team 10/11, reviewed ECHO and recommended to continue Eliquis  Elevated troponin -Likely from demand, insetting of periods of uncontrolled hypertension  -Clinically do not suspect ACS, troponin trending down   ESRD on HD TTS -Nephrology consulted, -Attempt to decrease dry weight as tolerated   Hyperkalemia  -stopped Entresto, now resolved, treated with 2 doses of Lokelma   Hypertension -uncontrolled on admit to ED -improved now    DMII, with hyperglycemia  - A1c is 9 -continue on insulin pump   Bipolar d/o  -resume home regimen    Prolonged QT syndrome  -currently borderline at 489 -continue to monitor     Anemia  stable   OSA -  O2 at bedtime    Hx DVT of R IJ vein -Cont home eliquis 2.5mg  BID indefinitely     Discharge Exam: Vitals:   06/09/23 0806 06/09/23 1207  BP: (!) 127/105 (!) 138/102  Pulse:  85   Resp: 17 19  Temp: 97.8 F (36.6 C) 98.1 F (36.7 C)  SpO2:  96%     Discharge Instructions   Discharge Instructions     Diet - low sodium heart healthy   Complete by: As directed    Diet Carb Modified   Complete by: As directed    Increase activity slowly   Complete by: As directed       Allergies as of 06/09/2023       Reactions   Cantaloupe Extract Allergy Skin Test Itching   Mouth itching   Citrullus Vulgaris Itching, Other (See Comments)   Makes mouth itch, ALL melons     Food Itching   All melon - mouth itching   Strawberry Extract Itching   Mouth itching   Nsaids Itching, Other (See Comments)   Avoid per nephrology        Medication List     STOP taking these medications    doxycycline 100 MG capsule Commonly known as: VIBRAMYCIN   Entresto 24-26 MG Generic drug: sacubitril-valsartan   metoCLOPramide 10 MG tablet Commonly known as: Reglan   oxyCODONE 5 MG immediate release tablet Commonly known as: Roxicodone       TAKE these medications    acetaminophen 500 MG tablet Commonly known as: TYLENOL Take 1,000 mg by mouth daily as needed for headache (pain).   amitriptyline 50 MG tablet Commonly known as: ELAVIL Take 1 tablet (50 mg total) by mouth at bedtime.   atorvastatin 40 MG tablet Commonly known as: LIPITOR Take 1 tablet (40 mg total) by mouth every evening.   butalbital-acetaminophen-caffeine 50-325-40 MG tablet Commonly known as: FIORICET Take 1 tablet by mouth every 6 (six) hours as needed.   calcium acetate 667 MG capsule Commonly known as: PHOSLO Take 1,334 mg by mouth 3 (three) times daily with meals.   carvedilol 25 MG tablet Commonly known as: COREG Take 1 tablet (25 mg total) by mouth 2 (two) times daily with a meal.   clonazePAM 0.5 MG tablet Commonly known as: KLONOPIN Take 1 tablet (0.5 mg total) by mouth 2 (two) times daily as needed for anxiety. What changed: when to take this   diazepam 2 MG  tablet Commonly known as: Valium Take 1 tablet (2 mg total) by mouth every 6 (six) hours as needed (vomiting).   dicyclomine 20 MG tablet Commonly known as: BENTYL Take 1 tablet (20 mg total) by mouth 2 (two) times daily as needed for spasms (Abdominal cramps).   Eliquis 2.5 MG Tabs tablet Generic drug: apixaban Take 2.5 mg by mouth 2 (two) times daily.   fentaNYL 25 MCG/HR Commonly known as: DURAGESIC Place 1 patch onto the skin every 3 (three) days. Start taking on: June 11, 2023   hydrALAZINE 100 MG tablet Commonly known as: APRESOLINE Take 1 tablet (100 mg total) by mouth 2 (two) times daily.   hydrOXYzine 10 MG tablet Commonly known as: ATARAX Take 10 mg by mouth daily. Also taken before dialysis on Tues, Thurs, Sat.   insulin lispro 100 UNIT/ML injection Commonly known as: HUMALOG Inject 0.5 Units into the skin See admin instructions. 0.5 units per hour via insulin pump.   isosorbide mononitrate 30 MG 24 hr tablet Commonly known as:  IMDUR Take 1 tablet (30 mg total) by mouth daily.   magnesium oxide 400 MG tablet Commonly known as: MAG-OX Take 2 tablets (800 mg total) by mouth daily.   methocarbamol 500 MG tablet Commonly known as: ROBAXIN Take 500 mg by mouth as needed for muscle spasms.   omeprazole 20 MG capsule Commonly known as: PRILOSEC Take 20 mg by mouth daily.   Rena-Vite Rx 1 MG Tabs Take 1 tablet by mouth daily.   SUMAtriptan 50 MG tablet Commonly known as: IMITREX Take 50 mg by mouth daily as needed for headache.   valACYclovir 500 MG tablet Commonly known as: VALTREX Take 500 mg by mouth daily as needed (cold sores).   Vitamin D (Ergocalciferol) 1.25 MG (50000 UNIT) Caps capsule Commonly known as: DRISDOL Take 50,000 Units by mouth every Sunday.       Allergies  Allergen Reactions   Cantaloupe Extract Allergy Skin Test Itching    Mouth itching     Citrullus Vulgaris Itching and Other (See Comments)    Makes mouth itch, ALL  melons     Food Itching    All melon - mouth itching   Strawberry Extract Itching    Mouth itching   Nsaids Itching and Other (See Comments)    Avoid per nephrology    Follow-up Information     Garcia-Grider, Angela M, PA Follow up.   Specialty: Physician Assistant Why: Follow-up appointment on 06/27/23 @2pm Contact information: [address] [place]  [id number] [phone]                  The results of significant diagnostics from this hospitalization (including imaging, microbiology, ancillary and laboratory) are listed below for reference.    Significant Diagnostic Studies: ECHOCARDIOGRAM LIMITED  Result Date: 06/07/2023    ECHOCARDIOGRAM LIMITED REPORT   Patient Name:   Kathy Lewis Date of Exam: 06/07/2023 Medical Rec #:  [id number]     Height:       60.0 in Accession #:    [id number]    Weight:       130.3 lb Date of Birth:  07/08/1996     BSA:          1.556 m Patient Age:    27 years      BP:           131/96 mmHg Patient Gender: F             HR:           88  bpm. Exam Location:  Inpatient Procedure: Limited Echo, Cardiac Doppler, Color Doppler and 3D Echo Indications:    122-I22.9 Subsequent ST elevation (STEM) and non-ST elevation                 (NSTEMI) myocardial infarction  History:        Patient has prior history of Echocardiogram examinations, most                 recent 05/07/2023. CHF, Signs/Symptoms:Syncope and Chest Pain;                 Risk Factors:Hypertension, Diabetes and Dyslipidemia. ESRD.                 Pulmonary embolus.  Sonographer:    Sheralyn Boatman RDCS Referring Phys: [id number] SUBRINA SUNDIL IMPRESSIONS  1. Left ventricular ejection fraction, by estimation, is 20 to 25%. The left ventricle has severely decreased function.  2. A small pericardial effusion is  present. There is no evidence of cardiac tamponade.  3. Mild mitral valve regurgitation.  4. Tricuspid valve regurgitation is moderate to severe.  5. The aortic valve is tricuspid.  6.  There is moderately elevated pulmonary artery systolic pressure.  7. There is a linear mobile density in the right atrium measuring 3.1 x 0.9 cm. On review of prior studies, it was present 04/2023 and 2/204 but not on prior echo studies. May represent thrombin sheath from prior line insertion. No evidence of endocarditis. Thrombus much less likely. FINDINGS  Left Ventricle: Left ventricular ejection fraction, by estimation, is 20 to 25%. The left ventricle has severely decreased function. Right Ventricle: There is moderately elevated pulmonary artery systolic pressure. The tricuspid regurgitant velocity is 2.88 m/s, and with an assumed right atrial pressure of 15 mmHg, the estimated right ventricular systolic pressure is 48.2 mmHg. Right Atrium: There is a linear mobile density in the right atrium measuring 3.1 x 0.9 cm. On review of prior studies, it was present 04/2023 and 2/204 but not on prior echo studies. May represent thrombin sheath from prior line insertion. No evidence of endocarditis. Thrombus much less likely. Pericardium: A small pericardial effusion is present. There is excessive respiratory variation in the tricuspid valve spectral Doppler velocities. There is no evidence of cardiac tamponade. Mitral Valve: Mild mitral valve regurgitation. MV peak gradient, 7.0 mmHg. The mean mitral valve gradient is 3.0 mmHg. Tricuspid Valve: Tricuspid valve regurgitation is moderate to severe. Aortic Valve: The aortic valve is tricuspid. Additional Comments: Spectral Doppler performed. Color Doppler performed.   LV Volumes (MOD) LV vol d, MOD A2C: 154.0 ml LV vol d, MOD A4C: 159.0 ml LV vol s, MOD A2C: 113.0 ml LV vol s, MOD A4C: 124.0 ml LV SV MOD A2C:     41.0 ml  3D Volume EF: LV SV MOD A4C:     159.0 ml 3D EF:        27 % LV SV MOD BP:      36.5 ml  LV EDV:       167 ml                             LV ESV:       122 ml                             LV SV:        44 ml IVC IVC diam: 2.60 cm AORTIC VALVE LVOT Vmax:    119.00 cm/s LVOT Vmean:  80.400 cm/s LVOT VTI:    0.208 m  AORTA Ao Asc diam: 3.00 cm MITRAL VALVE            TRICUSPID VALVE MV Peak grad: 7.0 mmHg  TR Peak grad:   33.2 mmHg MV Mean grad: 3.0 mmHg  TR Vmax:        288.00 cm/s MV Vmax:      1.32 m/s MV Vmean:     83.0 cm/s SHUNTS                         Systemic VTI: 0.21 m Chilton Si MD Electronically signed by Chilton Si MD Signature Date/Time: 06/07/2023/2:39:00 PM    Final    CT ABDOMEN PELVIS WO CONTRAST  Result Date: 06/05/2023 CLINICAL DATA:  Abdomen pain nausea and diarrhea EXAM: CT ABDOMEN AND PELVIS  WITHOUT CONTRAST TECHNIQUE: Multidetector CT imaging of the abdomen and pelvis was performed following the standard protocol without IV contrast. RADIATION DOSE REDUCTION: This exam was performed according to the departmental dose-optimization program which includes automated exposure control, adjustment of the mA and/or kV according to patient size and/or use of iterative reconstruction technique. COMPARISON:  CT 05/26/2023 FINDINGS: Lower chest: Marked cardiomegaly with small pericardial effusion, maximum thickness of 2.2 cm along the right cardiac border. Diffuse hazy pulmonary density at the bases suggesting mild edema. Atelectasis at the left base. Hepatobiliary: No focal liver abnormality is seen. No gallstones, gallbladder wall thickening, or biliary dilatation. Pancreas: Unremarkable. No pancreatic ductal dilatation or surrounding inflammatory changes. Spleen: Normal in size without focal abnormality. Adrenals/Urinary Tract: Adrenal glands are unremarkable. Kidneys are normal, without renal calculi, focal lesion, or hydronephrosis. Bladder is unremarkable. Stomach/Bowel: The stomach is nonenlarged. No dilated small bowel. Radiopaque material in the colon. Mild wall thickening of the descending colon with surrounding stranding, consistent with mild colitis, coronal series 603, image 65 through 77. Vascular/Lymphatic: Mild renal artery  and SMA calcifications. No aneurysm. No suspicious lymph nodes Reproductive: Uterus and bilateral adnexa are unremarkable. Other: No free air. Small volume free fluid within the pelvis and abdomen Musculoskeletal: No acute osseous abnormality IMPRESSION: 1. Mild wall thickening of the descending colon with surrounding stranding consistent with mild colitis of infectious or inflammatory etiology. 2. Marked cardiomegaly with small pericardial effusion. Hazy pulmonary density at the bases suggesting mild edema. Small volume free fluid within the abdomen and pelvis. Electronically Signed   By: Jasmine Pang M.D.   On: 06/05/2023 21:41   DG Chest Portable 1 View  Result Date: 05/27/2023 CLINICAL DATA:  Shortness of breath EXAM: PORTABLE CHEST 1 VIEW COMPARISON:  05/26/2019 FINDINGS: Cardiac shadow remains enlarged. Dialysis catheter is noted. Lungs are well aerated without focal infiltrate. No bony abnormality is noted. IMPRESSION: No active disease. Electronically Signed   By: Alcide Clever M.D.   On: 05/27/2023 01:40   DG Chest Portable 1 View  Result Date: 05/26/2023 CLINICAL DATA:  Altered mental status. EXAM: PORTABLE CHEST 1 VIEW COMPARISON:  Chest radiograph dated 05/17/2023. FINDINGS: The heart is markedly enlarged. There is mild left basilar atelectasis. The right lung is clear. No significant pleural effusion or pneumothorax. A right internal jugular central venous catheter tip overlies the right atrium. No significant bony abnormality. IMPRESSION: Marked cardiomegaly. Mild left basilar atelectasis. Electronically Signed   By: Romona Curls M.D.   On: 05/26/2023 15:24   CT ABDOMEN PELVIS WO CONTRAST  Result Date: 05/26/2023 CLINICAL DATA:  Abdominal pain. EXAM: CT ABDOMEN AND PELVIS WITHOUT CONTRAST TECHNIQUE: Multidetector CT imaging of the abdomen and pelvis was performed following the standard protocol without IV contrast. RADIATION DOSE REDUCTION: This exam was performed according to the  departmental dose-optimization program which includes automated exposure control, adjustment of the mA and/or kV according to patient size and/or use of iterative reconstruction technique. COMPARISON:  CT abdomen and pelvis dated 02/19/2023. FINDINGS: Lower chest: The heart is markedly enlarged, unchanged. There is mild left lower lobe atelectasis. Hepatobiliary: No focal liver abnormality is seen. No gallstones, gallbladder wall thickening, or biliary dilatation. Pancreas: Unremarkable. No pancreatic ductal dilatation or surrounding inflammatory changes. Spleen: Normal in size without focal abnormality. Adrenals/Urinary Tract: Adrenal glands are unremarkable. Kidneys are normal, without renal calculi, suspicious focal lesion, or hydronephrosis. The urinary bladder wall is diffusely thickened, measuring up to 5 mm. Stomach/Bowel: Stomach is within normal limits. The appendix is not  definitely visualized. No evidence of bowel wall thickening, distention, or inflammatory changes. Vascular/Lymphatic: Vascular calcifications are seen in the pelvis and involving the superior vena cava. No enlarged abdominal or pelvic lymph nodes. Reproductive: Uterus and bilateral adnexa are unremarkable. Other: Small volume free intraperitoneal fluid. No abdominal wall hernia. Musculoskeletal: No acute or significant osseous findings. IMPRESSION: 1. Diffuse urinary bladder wall thickening may reflect cystitis. Correlation with urinalysis is recommended. 2. Small volume free intraperitoneal fluid. 3. Marked cardiomegaly. Electronically Signed   By: Romona Curls M.D.   On: 05/26/2023 15:22   CT Head Wo Contrast  Result Date: 05/26/2023 CLINICAL DATA:  Altered mental status. EXAM: CT HEAD WITHOUT CONTRAST TECHNIQUE: Contiguous axial images were obtained from the base of the skull through the vertex without intravenous contrast. RADIATION DOSE REDUCTION: This exam was performed according to the departmental dose-optimization program  which includes automated exposure control, adjustment of the mA and/or kV according to patient size and/or use of iterative reconstruction technique. COMPARISON:  CT head dated 10/04/2022. FINDINGS: Brain: No evidence of acute infarction, hemorrhage, hydrocephalus, extra-axial collection or mass lesion/mass effect. A chronic lacunar infarct in the superior right cerebellum is redemonstrated. Vascular: There are vascular calcifications in the carotid siphons. Skull: Normal. Negative for fracture or focal lesion. Sinuses/Orbits: No acute finding. Other: None. IMPRESSION: No acute intracranial pathology. Electronically Signed   By: Romona Curls M.D.   On: 05/26/2023 15:17   DG Chest Port 1 View  Result Date: 05/17/2023 CLINICAL DATA:  History of abdominal pain EXAM: PORTABLE CHEST 1 VIEW COMPARISON:  March 11, 2023 FINDINGS: The cardiomediastinal silhouette is unchanged and massively enlarged in contour.RIGHT chest CVC with tip terminating over the RIGHT atrium. No pleural effusion. No pneumothorax. Perihilar vascular fullness without overt edema. LEFT basilar atelectasis. No acute pleuroparenchymal abnormality. Visualized abdomen is unremarkable. IMPRESSION: Cardiomegaly with vascular congestion without overt edema. Electronically Signed   By: Meda Klinefelter M.D.   On: 05/17/2023 17:20    Microbiology: No results found for this or any previous visit (from the past 240 hour(s)).   Labs: Basic Metabolic Panel: Recent Labs  Lab 06/06/23 1731 06/06/23 2009 06/06/23 2313 06/07/23 0325 06/07/23 0920 06/07/23 1056 06/08/23 0634 06/09/23 0341  NA 135 132*  --  136 133*  --  135 135  K 6.1* 5.3*   < > 5.1 5.7* 5.8* 4.1 4.4  CL 103 98  --  99 99  --  97* 94*  CO2 18* 21*  --  24 20*  --  26 24  GLUCOSE 332* 479*  --  185* 313*  --  217* 244*  BUN 50* 50*  --  55* 66*  --  36* 48*  CREATININE 7.60* 7.87*  --  9.22* 9.46*  --  6.25* 8.23*  CALCIUM 9.4 9.0  --  9.1 9.2  --  9.0 9.6  MG 2.5*  --    --   --   --   --   --   --   PHOS 5.0*  --   --   --   --   --   --   --    < > = values in this interval not displayed.   Liver Function Tests: Recent Labs  Lab 06/06/23 1731 06/07/23 0325 06/07/23 0920 06/08/23 0634 06/09/23 0341  AST 24 14* 17 17 19   ALT 14 13 14 15 14   ALKPHOS 84 77 82 76 79  BILITOT 1.1 0.6 0.8 0.9 1.0  PROT 6.9  6.2* 6.6 6.0* 6.7  ALBUMIN 3.3* 3.0* 3.2* 2.9* 3.4*   Recent Labs  Lab 06/05/23 2014  LIPASE 29   No results for input(s): "AMMONIA" in the last 168 hours. CBC: Recent Labs  Lab 06/06/23 1558 06/07/23 0325 06/07/23 0920 06/08/23 0634 06/09/23 0341  WBC 3.4* 3.5* 2.3* 3.4* 2.8*  NEUTROABS 1.8  --   --   --  1.7  HGB 13.2 11.4* 12.4 11.8* 13.0  HCT 39.8 35.4* 38.8 35.6* 39.7  MCV 100.3* 99.2 99.7 97.3 95.9  PLT 171 190 PLATELET CLUMPS NOTED ON SMEAR, UNABLE TO ESTIMATE 178 176   Cardiac Enzymes: No results for input(s): "CKTOTAL", "CKMB", "CKMBINDEX", "TROPONINI" in the last 168 hours. BNP: BNP (last 3 results) Recent Labs    09/11/22 2222 05/17/23 1632 05/27/23 0852  BNP 2,413.8* 2,673.9* >4,500.0*    ProBNP (last 3 results) No results for input(s): "PROBNP" in the last 8760 hours.  CBG: Recent Labs  Lab 06/09/23 0050 06/09/23 0129 06/09/23 0503 06/09/23 0804 06/09/23 1204  GLUCAP 46* 179* 187* 131* 211*       Signed:  Zannie Cove MD.  [facility] 06/09/2023, 12:35 PM

## 2023-06-09 NOTE — Plan of Care (Signed)
Problem: Education: Goal: Knowledge of risk factors and measures for prevention of condition will improve Outcome: Progressing   Problem: Coping: Goal: Psychosocial and spiritual needs will be supported Outcome: Progressing   Problem: Respiratory: Goal: Will maintain a patent airway Outcome: Progressing Goal: Complications related to the disease process, condition or treatment will be avoided or minimized Outcome: Progressing

## 2023-06-09 NOTE — Progress Notes (Signed)
[facility] Progress Note   Subjective:    Plan for discharge after HD today. I spoke with the charge HD RN at patient's outpatient hemodialysis facility. They informed me patient will be able to return back to resume treatment. They are aware that her insurance changed and plan to sort things out with her insurance and assist her with transfer to another facility if needed. Discussed this with Dr. Jomarie Longs. Seen and examined patient at bedside who agrees with this plan. She informed me her EDW is 54kg. Plan for HD this afternoon.   Objective Vitals:   06/09/23 0019 06/09/23 0506 06/09/23 0806 06/09/23 1207  BP: (!) 131/100 (!) 123/92 (!) 127/105 (!) 138/102  Pulse: 81 89  85  Resp: 18 18 17 19   Temp: 97.6 F (36.4 C) 98.1 F (36.7 C) 97.8 F (36.6 C) 98.1 F (36.7 C)  TempSrc: Oral Oral Oral Oral  SpO2: 93% 93%  96%  Weight:  58.7 kg    Height:       Physical Exam General: Awake, alert, pleasant, NAD, on RA Heart: S1 and S2; No MRGs Lungs: Clear throughout; No wheezing, rales, or rhonchi Abdomen: Soft and non-tender Extremities: No LE edema Dialysis Access: [facility]   Filed Weights   06/07/23 0414 06/08/23 0456 06/09/23 0506  Weight: 59.1 kg 57.7 kg 58.7 kg    Intake/Output Summary (Last 24 hours) at 06/09/2023 1300 Last data filed at 06/09/2023 1100 Gross per 24 hour  Intake 120 ml  Output 500 ml  Net -380 ml    Additional Objective Labs: Basic Metabolic Panel: Recent Labs  Lab 06/06/23 1731 06/06/23 2009 06/07/23 0920 06/07/23 1056 06/08/23 0634 06/09/23 0341  NA 135   < > 133*  --  135 135  K 6.1*   < > 5.7* 5.8* 4.1 4.4  CL 103   < > 99  --  97* 94*  CO2 18*   < > 20*  --  26 24  GLUCOSE 332*   < > 313*  --  217* 244*  BUN 50*   < > 66*  --  36* 48*  CREATININE 7.60*   < > 9.46*  --  6.25* 8.23*  CALCIUM 9.4   < > 9.2  --  9.0 9.6  PHOS 5.0*  --   --   --   --   --    < > = values in this interval not displayed.   Liver Function  Tests: Recent Labs  Lab 06/07/23 0920 06/08/23 0634 06/09/23 0341  AST 17 17 19   ALT 14 15 14   ALKPHOS 82 76 79  BILITOT 0.8 0.9 1.0  PROT 6.6 6.0* 6.7  ALBUMIN 3.2* 2.9* 3.4*   Recent Labs  Lab 06/05/23 2014  LIPASE 29   CBC: Recent Labs  Lab 06/06/23 1558 06/07/23 0325 06/07/23 0920 06/08/23 0634 06/09/23 0341  WBC 3.4* 3.5* 2.3* 3.4* 2.8*  NEUTROABS 1.8  --   --   --  1.7  HGB 13.2 11.4* 12.4 11.8* 13.0  HCT 39.8 35.4* 38.8 35.6* 39.7  MCV 100.3* 99.2 99.7 97.3 95.9  PLT 171 190 PLATELET CLUMPS NOTED ON SMEAR, UNABLE TO ESTIMATE 178 176   Blood Culture    Component Value Date/Time   SDES BLOOD LEFT HAND 10/20/2022 1837   SPECREQUEST  10/20/2022 1837    BOTTLES DRAWN AEROBIC AND ANAEROBIC Blood Culture results may not be optimal due to an inadequate volume of blood received in culture bottles  CULT  10/20/2022 1837    NO GROWTH 6 DAYS Performed at [facility], [address], [place], [place] [address]    REPTSTATUS 10/26/2022 FINAL 10/20/2022 1837    Cardiac Enzymes: No results for input(s): "CKTOTAL", "CKMB", "CKMBINDEX", "TROPONINI" in the last 168 hours. CBG: Recent Labs  Lab 06/09/23 0050 06/09/23 0129 06/09/23 0503 06/09/23 0804 06/09/23 1204  GLUCAP 46* 179* 187* 131* 211*   Iron Studies: No results for input(s): "IRON", "TIBC", "TRANSFERRIN", "FERRITIN" in the last 72 hours. Lab Results  Component Value Date   INR 1.0 10/22/2019   INR 1.0 10/22/2019   Studies/Results: No results found.  Medications:   amitriptyline  50 mg Oral QHS   apixaban  2.5 mg Oral BID   atorvastatin  40 mg Oral QPM   calcium acetate  1,334 mg Oral TID WC   carvedilol  25 mg Oral BID WC   Chlorhexidine Gluconate Cloth  6 each Topical Q0600   clonazepam  0.25 mg Oral BID   dextrose       fentaNYL  1 patch Transdermal Q72H   hydrALAZINE  100 mg Oral BID   hydrOXYzine  10 mg Oral Daily   insulin pump   Subcutaneous Q4H   isosorbide mononitrate  30  mg Oral Daily   multivitamin  1 tablet Oral QHS   pantoprazole  40 mg Oral Daily   prochlorperazine  5 mg Intravenous Once   scopolamine  1 patch Transdermal Q72H    Dialysis Orders: [facility] [address]  TTS 3.5h  300/600  TDC  Heparin 2000 + 500u/hr (needs updating, this from June 2024)     Renal-related home meds: - renavite - phoslo 2 ac  - coreg 25 bid - eliquis 2.5 bid - hydralazine 100 bid - imdur 30  - entresto bid  Assessment/Plan: Abdominal pain w/ N/V - Resolved, Per pmd ESRD - on HD TTS. Received HD overnight (2L off). Next HD 10/12. HTN- BP's are normal.  Volume - no vol excess on exam. She informed me of her EDW being 54kg. UFG set 2-3L per her request, she is aware of her body and how fluid to remove. Keep SBP >100. Anemia esrd - Hb 13, no esa needs. Follow.  MBD ckd - CCa and phos are in range. Cont phoslo as binder. Get records.  DM1 - per pmd HFrEF - last EF 15-20% DVT of R internal jugular vein - on eliquis OSA - wears O2 at night Dispo - I spoke with the charge HD RN at her outpatient hemodialysis center. They informed me of patient can return back to their facility to resume treatment. They are aware her insurance changed and plan to sort things out and transfer her to another facility if needed. Discussed with Dr. Jomarie Longs. Okay for discharge after HD today from a renal standpoint.  Salome Holmes, NP [facility] 06/09/2023,1:00 PM  LOS: 3 days

## 2023-06-09 NOTE — Progress Notes (Signed)
Patient nurse reporting have 1 episode of vomiting 500 mL. - Due to QT prolongation avoiding Zofran and Phenergan. - Ordered scopolamine patch.   Tereasa Coop, MD [facility] 06/09/2023, 2:03 AM

## 2023-06-10 LAB — GLUCOSE, CAPILLARY: Glucose-Capillary: 360 mg/dL — ABNORMAL HIGH (ref 70–99)

## 2023-06-10 NOTE — Progress Notes (Signed)
Discharge orders per Provider. Discharge instructions given by prior shift RN, all questions answered. PIV also discontinued by Judyann Munson, RN on prior shift. Pt stayed overnight as she was taken to Hemodialysis after 2300 and completed HD approximately 0345, with 2L removed. Telemetry d/c'd. See flowsheet for vital signs. Pt left floor to home accompanied by her mother.

## 2023-06-10 NOTE — Progress Notes (Addendum)
Received patient in bed to unit.  Alert and oriented.  Informed consent signed and in chart.   TX duration:3.25  Patient tolerated well.  Transported back to the room  Alert, without acute distress.  Hand-off given to patient's nurse.   Access used: dialysis cath Access issues: none  Total UF removed: 1500 per patient. To increase by 500 for UF for a total of 2000 Medication(s) given: 2500 units heparin bolus prior to starying, 2000 units mid run, 1.6 units heplock per port Post HD VS: see table below Post HD weight: 58.1kg standing scale   06/10/23 0312  Vitals  Temp 98.1 F (36.7 C)  Temp Source Oral  BP 104/71  MAP (mmHg) 81  BP Location Right Arm  BP Method Automatic  Patient Position (if appropriate) Lying  Pulse Rate 85  Pulse Rate Source Monitor  ECG Heart Rate 85  Resp 14  Oxygen Therapy  SpO2 97 %  O2 Device Room Air  Patient Activity (if Appropriate) In bed  Pulse Oximetry Type Continuous  During Treatment Monitoring  Blood Flow Rate (mL/min) 0 mL/min  Arterial Pressure (mmHg) -12.52 mmHg  Venous Pressure (mmHg) 34.54 mmHg  TMP (mmHg) 9.49 mmHg  Ultrafiltration Rate (mL/min) 901 mL/min  Dialysate Flow Rate (mL/min) 299 ml/min  Dialysate Potassium Concentration 3  Dialysate Calcium Concentration 2.5  Duration of HD Treatment -hour(s) 3.25 hour(s)  Cumulative Fluid Removed (mL) per Treatment  2000.15  HD Safety Checks Performed Yes  Intra-Hemodialysis Comments Tolerated well  Post Treatment  Dialyzer Clearance Clear  Hemodialysis Intake (mL) 0 mL  Liters Processed 78  Fluid Removed (mL) 2000 mL  Tolerated HD Treatment Yes  Post-Hemodialysis Comments goal met  Hemodialysis Catheter Right Internal jugular Double lumen Permanent (Tunneled)  Placement Date/Time: 09/14/22 1520   Placed prior to admission: No  Serial / Lot #: [id number]  Expiration Date: 05/31/27  Time Out: Correct patient;Correct site;Correct procedure  Maximum sterile barrier  precautions: Hand hygiene;Cap;Mask;Sterile gow...  Site Condition No complications  Blue Lumen Status Flushed;Heparin locked  Red Lumen Status Flushed;Heparin locked  Purple Lumen Status N/A  Catheter fill solution Heparin 1000 units/ml  Catheter fill volume (Arterial) 1.6 cc  Catheter fill volume (Venous) 1.6      [facility]

## 2023-06-11 NOTE — Progress Notes (Signed)
Late Note Entry- June 11, 2023  Pt was d/c to home on Sunday. Per renal NP note, plan is for pt to resume at current clinic ([facility]) and clinic work out insurance issue or transfer pt to another clinic. Pt can resume at clinic on Tuesday per note. Contacted [facility] this morning to provide pt's d/c date and that pt should resume care tomorrow. Clinic has access to [facility] for needed clinicals. Requested that clinic social worker please f/u with navigator regarding final plan for out-pt HD in the future so [facility] can be provided an update regarding pt's referral from Friday. Update provided to [facility] admissions this morning regarding the above.   Olivia Canter Renal Navigator [phone]

## 2023-06-21 ENCOUNTER — Encounter (HOSPITAL_COMMUNITY): Payer: Self-pay

## 2023-06-21 ENCOUNTER — Ambulatory Visit (HOSPITAL_COMMUNITY)
Admit: 2023-06-21 | Discharge: 2023-06-21 | Disposition: A | Discharge status: Home / Self Care | Payer: 59 | Source: Ambulatory Visit | Attending: Adult Health | Admitting: Adult Health

## 2023-06-21 VITALS — BP 112/80 | HR 84 | Wt 127.8 lb

## 2023-06-21 DIAGNOSIS — E1065 Type 1 diabetes mellitus with hyperglycemia: Secondary | ICD-10-CM | POA: Diagnosis not present

## 2023-06-21 DIAGNOSIS — N186 End stage renal disease: Secondary | ICD-10-CM | POA: Diagnosis not present

## 2023-06-21 DIAGNOSIS — Z7901 Long term (current) use of anticoagulants: Secondary | ICD-10-CM | POA: Diagnosis not present

## 2023-06-21 DIAGNOSIS — Z91199 Patient's noncompliance with other medical treatment and regimen due to unspecified reason: Secondary | ICD-10-CM | POA: Diagnosis not present

## 2023-06-21 DIAGNOSIS — R109 Unspecified abdominal pain: Secondary | ICD-10-CM | POA: Diagnosis not present

## 2023-06-21 DIAGNOSIS — R112 Nausea with vomiting, unspecified: Secondary | ICD-10-CM | POA: Insufficient documentation

## 2023-06-21 DIAGNOSIS — I3139 Other pericardial effusion (noninflammatory): Secondary | ICD-10-CM | POA: Insufficient documentation

## 2023-06-21 DIAGNOSIS — I132 Hypertensive heart and chronic kidney disease with heart failure and with stage 5 chronic kidney disease, or end stage renal disease: Secondary | ICD-10-CM | POA: Diagnosis not present

## 2023-06-21 DIAGNOSIS — E1022 Type 1 diabetes mellitus with diabetic chronic kidney disease: Secondary | ICD-10-CM | POA: Diagnosis not present

## 2023-06-21 DIAGNOSIS — I82C11 Acute embolism and thrombosis of right internal jugular vein: Secondary | ICD-10-CM | POA: Diagnosis not present

## 2023-06-21 DIAGNOSIS — Z8759 Personal history of other complications of pregnancy, childbirth and the puerperium: Secondary | ICD-10-CM | POA: Insufficient documentation

## 2023-06-21 DIAGNOSIS — I5022 Chronic systolic (congestive) heart failure: Secondary | ICD-10-CM | POA: Insufficient documentation

## 2023-06-21 DIAGNOSIS — Q209 Congenital malformation of cardiac chambers and connections, unspecified: Secondary | ICD-10-CM

## 2023-06-21 DIAGNOSIS — Z794 Long term (current) use of insulin: Secondary | ICD-10-CM | POA: Diagnosis not present

## 2023-06-21 DIAGNOSIS — Z79899 Other long term (current) drug therapy: Secondary | ICD-10-CM | POA: Insufficient documentation

## 2023-06-21 DIAGNOSIS — Z992 Dependence on renal dialysis: Secondary | ICD-10-CM | POA: Insufficient documentation

## 2023-06-21 DIAGNOSIS — I10 Essential (primary) hypertension: Secondary | ICD-10-CM

## 2023-06-21 LAB — COMPREHENSIVE METABOLIC PANEL
ALT: 20 U/L (ref 0–44)
AST: 23 U/L (ref 15–41)
Albumin: 3.4 g/dL — ABNORMAL LOW (ref 3.5–5.0)
Alkaline Phosphatase: 76 U/L (ref 38–126)
Anion gap: 11 (ref 5–15)
BUN: 14 mg/dL (ref 6–20)
CO2: 29 mmol/L (ref 22–32)
Calcium: 8.3 mg/dL — ABNORMAL LOW (ref 8.9–10.3)
Chloride: 97 mmol/L — ABNORMAL LOW (ref 98–111)
Creatinine, Ser: 3.78 mg/dL — ABNORMAL HIGH (ref 0.44–1.00)
GFR, Estimated: 16 mL/min — ABNORMAL LOW (ref >60)
Glucose, Bld: 208 mg/dL — ABNORMAL HIGH (ref 70–99)
Potassium: 4 mmol/L (ref 3.5–5.1)
Sodium: 137 mmol/L (ref 135–145)
Total Bilirubin: 0.9 mg/dL (ref 0.3–1.2)
Total Protein: 6.9 g/dL (ref 6.5–8.1)

## 2023-06-21 NOTE — Patient Instructions (Signed)
Labs done today. We will contact you only if your labs are abnormal.  No medication changes were made. Please continue all current medications as prescribed.  Your physician recommends that you keep your scheduled follow-up appointment.  If you have any questions or concerns before your next appointment please send [place] a message through [place] or call our office at [phone].    TO LEAVE A MESSAGE FOR THE NURSE SELECT OPTION 2, PLEASE LEAVE A MESSAGE INCLUDING: YOUR NAME DATE OF BIRTH CALL BACK NUMBER REASON FOR CALL**this is important as we prioritize the call backs  YOU WILL RECEIVE A CALL BACK THE SAME DAY AS LONG AS YOU CALL BEFORE 4:00 PM   Do the following things EVERYDAY: Weigh yourself in the morning before breakfast. Write it down and keep it in a log. Take your medicines as prescribed Eat low salt foods--Limit salt (sodium) to 2000 mg per day.  Stay as active as you can everyday Limit all fluids for the day to less than 2 liters   At the [facility], you and your health needs are our priority. As part of our continuing mission to provide you with exceptional heart care, we have created designated Provider Care Teams. These Care Teams include your primary Cardiologist (physician) and Advanced Practice Providers (APPs- Physician Assistants and Nurse Practitioners) who all work together to provide you with the care you need, when you need it.   You may see any of the following providers on your designated Care Team at your next follow up: Dr Arvilla Meres Dr Marca Ancona Dr. Marcos Eke, NP Robbie Lis, [place] [facility] [place], [place] Brynda Peon, NP Karle Plumber, PharmD   Please be sure to bring in all your medications bottles to every appointment.    Thank you for choosing [facility]

## 2023-06-21 NOTE — Progress Notes (Signed)
ADVANCED HF CLINIC NOTE  PCP: Loura Pardon, PA Primary Cardiologist: Dr. Gala Romney  HPI:  Kathy Lewis is a 27 y/o woman with DM1, ESRD on PD and presumed peripartum CM dating back to 2021 with severe LV dysfunction. Has been followed at [facility] AHF team but work-up complicated by lack of f/o and compliance issues.    Admitted to [facility] and underwent RHC with markedly elevated filling pressures and low output. Started on inotropes. Also underwent CVVHD for several days but refused conversion to PD. Left AMA.   Echo 3/23 EF 20% RV moderately reduced   Admitted 1/24 for HF decompensation. PD not working. Switched to HD.  I saw her in clinic 1/24 and referred to [facility] for heart/kidney/pancreas transplant eval. Cancelled appointment due to COVID  Admitted 10/04/22 with intractable n/v and HTN urgency. Head CT with small chronic appearing right superior cerebellar infarct. No acute changes. Developed hypoglycemia and persistent uncontrolled hypertension. Admitted to the ICU for clevidipine infusion. Oral meds adjusted  Echo 10/05/22 EF < 20% RV moderately reduced. Mild MR  Seen in ED 10/22/22 with COVID. Left AMA  Returned to ED 11/14/22 with abdominal pain. Given IV fluids, reglan, and dilaudid. She left ED.   (Had normal gastric emptying study in 6/23)  Was seen at [facility] for heart-kidney transplant evaluation. CPX 4/24. pVO2 10.7 (34%) VE/VCO2 36 RER 0.92. Has been deferred for now until she can prove better compliance.  Admitted 6/24 with N/V.   Echo 05/07/23: EF 20-25%   She's had multiple recent ED visits and admissions for abdominal pain, nausea and vomiting. Readmitted earlier this month with abdominal pain. GI previously felt may be cyclical vomiting syndrome +/- abdominal congestion from CHF. Entresto held with hyperkalemia. Planned to to lower dry weight as tolerated.  She is here today for f/u.  Abdominal pain has significantly improved. She is tolerating HD. She started  going to the gym this week. She has been using stationary bike, walking on the treadmill and using resistance machines. No dyspnea, orthopnea, PND or lower extremity edema. Weight averages between 120-130 lb depending on HD. She is taking all medications. Entresto stopped at recent discharge but she continued on the medicine.   ROS: All systems negative except as listed in HPI, PMH and Problem List.  SH:  Social History   Socioeconomic History   Marital status: Single    Spouse name: Not on file   Number of children: 1   Years of education: Not on file   Highest education level: Not on file  Occupational History   Occupation: disabled  Tobacco Use   Smoking status: Former    Current packs/day: 1.00    Average packs/day: 1 pack/day for 2.0 years (2.0 ttl pk-yrs)    Types: Cigars, Cigarettes   Smokeless tobacco: Never  Vaping Use   Vaping status: Never Used  Substance and Sexual Activity   Alcohol use: No   Drug use: No   Sexual activity: Yes    Birth control/protection: None  Other Topics Concern   Not on file  Social History Narrative   Not on file   Social Determinants of Health   Financial Resource Strain: Low Risk  (04/19/2023)   Received from [facility]   Overall Financial Resource Strain (CARDIA)    Difficulty of Paying Living Expenses: Not hard at all  Food Insecurity: No Food Insecurity (06/06/2023)   Hunger Vital Sign    Worried About Running Out of Food in the Last Year:  Never true    Ran Out of Food in the Last Year: Never true  Transportation Needs: No Transportation Needs (06/09/2023)   PRAPARE - Administrator, Civil Service (Medical): No    Lack of Transportation (Non-Medical): No  Recent Concern: Transportation Needs - Unmet Transportation Needs (04/14/2023)   Received from [facility]   Transportation    In the past 12 months, has lack of reliable transportation kept you from medical appointments, meetings, work or from getting things  needed for daily living? : Yes  Physical Activity: Unknown (04/19/2023)   Received from [facility]   Exercise Vital Sign    Days of Exercise per Week: 0 days    Minutes of Exercise per Session: Not on file  Stress: No Stress Concern Present (04/19/2023)   Received from [facility] - Occupational Stress Questionnaire    Feeling of Stress : Not at all  Social Connections: Moderately Integrated (04/19/2023)   Received from [facility]   Social Network    How would you rate your social network (family, work, friends)?: Adequate participation with social networks  Intimate Partner Violence: Not At Risk (06/09/2023)   Humiliation, Afraid, Rape, and Kick questionnaire    Fear of Current or Ex-Partner: No    Emotionally Abused: No    Physically Abused: No    Sexually Abused: No    FH:  Family History  Adopted: Yes  Problem Relation Age of Onset   Heart disease Neg Hx     Past Medical History:  Diagnosis Date   Anemia    Anxiety    Bipolar 2 disorder (HCC)    Chronic kidney disease    Chronic systolic (congestive) heart failure (HCC)    Depression    DKA (diabetic ketoacidoses)    ESRD on peritoneal dialysis (HCC)    HSV infection    on valtrex   Hypokalemia    Leukocytosis    Migraine    Noncompliance with medication regimen    Preeclampsia    Prolonged QT syndrome    Severe anemia    Type 1 diabetes mellitus (HCC)     Current Outpatient Medications  Medication Sig Dispense Refill   acetaminophen (TYLENOL) 500 MG tablet Take 1,000 mg by mouth daily as needed for headache (pain).     amitriptyline (ELAVIL) 50 MG tablet Take 1 tablet (50 mg total) by mouth at bedtime. 30 tablet 3   atorvastatin (LIPITOR) 40 MG tablet Take 1 tablet (40 mg total) by mouth every evening. 30 tablet 11   B Complex-C-Folic Acid (RENA-VITE RX) 1 MG TABS Take 1 tablet by mouth daily.     butalbital-acetaminophen-caffeine (FIORICET) 50-325-40 MG  tablet Take 1 tablet by mouth every 6 (six) hours as needed.     calcium acetate (PHOSLO) 667 MG capsule Take 1,334 mg by mouth 3 (three) times daily with meals.     carvedilol (COREG) 25 MG tablet Take 1 tablet (25 mg total) by mouth 2 (two) times daily with a meal. 60 tablet 6   clonazePAM (KLONOPIN) 0.5 MG tablet Take 1 tablet (0.5 mg total) by mouth 2 (two) times daily as needed for anxiety. (Patient taking differently: Take 0.5 mg by mouth at bedtime.) 20 tablet 0   diazepam (VALIUM) 2 MG tablet Take 1 tablet (2 mg total) by mouth every 6 (six) hours as needed (vomiting). 12 tablet 0   dicyclomine (BENTYL) 20 MG tablet Take 1 tablet (20  mg total) by mouth 2 (two) times daily as needed for spasms (Abdominal cramps). 45 tablet 0   ELIQUIS 2.5 MG TABS tablet Take 2.5 mg by mouth 2 (two) times daily.     fentaNYL (DURAGESIC) 25 MCG/HR Place 1 patch onto the skin every 3 (three) days. 10 patch 0   hydrALAZINE (APRESOLINE) 100 MG tablet Take 1 tablet (100 mg total) by mouth 2 (two) times daily. 60 tablet 6   hydrOXYzine (ATARAX) 10 MG tablet Take 10 mg by mouth daily. Also taken before dialysis on Tues, Thurs, Sat.     insulin lispro (HUMALOG) 100 UNIT/ML injection Inject 0.5 Units into the skin See admin instructions. 0.5 units per hour via insulin pump.     isosorbide mononitrate (IMDUR) 30 MG 24 hr tablet Take 1 tablet (30 mg total) by mouth daily. 30 tablet 0   magnesium oxide (MAG-OX) 400 MG tablet Take 2 tablets (800 mg total) by mouth daily. 60 tablet 6   methocarbamol (ROBAXIN) 500 MG tablet Take 500 mg by mouth as needed for muscle spasms.     omeprazole (PRILOSEC) 20 MG capsule Take 20 mg by mouth daily.     sacubitril-valsartan (ENTRESTO) 24-26 MG Take 1 tablet by mouth 2 (two) times daily.     SUMAtriptan (IMITREX) 50 MG tablet Take 50 mg by mouth daily as needed for headache.     valACYclovir (VALTREX) 500 MG tablet Take 500 mg by mouth daily as needed (cold sores).     Vitamin D,  Ergocalciferol, (DRISDOL) 1.25 MG (50000 UNIT) CAPS capsule Take 50,000 Units by mouth every Sunday.     No current facility-administered medications for this encounter.    Vitals:   06/21/23 1154  BP: 112/80  Pulse: 84  SpO2: 94%  Weight: 58 kg (127 lb 12.8 oz)     Wt Readings from Last 3 Encounters:  06/21/23 58 kg (127 lb 12.8 oz)  06/10/23 58.1 kg (128 lb 1.4 oz)  05/27/23 58 kg (127 lb 13.9 oz)    PHYSICAL EXAM: General:  Well appearing. Ambulated into clinic. HEENT: normal Neck: supple. no JVD.  Cor: PMI nondisplaced. Regular rate & rhythm. No rubs, gallops or murmurs. Lungs: clear Abdomen: soft, nontender, nondistended.  Extremities: no cyanosis, clubbing, rash, edema Neuro: alert & orientedx3. Affect pleasant   ASSESSMENT & PLAN: 1. Chronic systolic heart failure - Hx Peripartum CM 03/2020. RHC 07/29/20:  CO low-normal by TD (CO/CI) Thermodilution: 4.07/2.59) and normal cardiac output by Fick (CO/CI Fick: 4.7/3.0) ECHOs 21' suggested evidence of possible non-compaction, but cMRI 39' showed no evidence of infarction, myocarditis, or infiltrative patterns. TTE on 07/30/20 with LVEF 35-40% but declined to 25-30% around 04/2021 likely in the setting of noncompliance with medical therapies.  - Echo 10/2021: EF of 20%. Small pericardial effusion. Indeterminate diastolic function   - Echo 08/14/22: EF 13%, mod MR, severe TR, PASP ~50, small effusion - RHC at [facility] 12/23: RA mean, PA mean 56, PCWP 39, PA sat 56%, Fick CI 1.7, Thermo CI 1.9 - Echo 10/05/22 EF < 20% RV moderately reduced. Mild MR - CPX 4/24. pVO2 10.7 (34%) VE/VCO2 36 RER 0.92. - Echo 05/07/23: EF 20-25%  - Echo 10/24: EF 20-25%, RVSP 48 mmHg, moderate to severe TR, linear mobile density in RA (may represent thrombin sheath from prior line) - Previously followed by AHF team at [facility] but has switched care here - Admitted with volume overload 1/24. PD wasn't working. Transitioned to HD  - Improved NYHA II.  Volume looks  good today - Now tolerating carvedilol 25 bid, hydral 100 bid/imdur 30 daily and entresto 24/26 mg BID. Will check labs today given recent hyperkalemia. - Has been seen at [facility] for heart-kidney-pancreas transplant in 4/24. She has been turned down for compliance issues (signing out AMA). We discussed this in detail and discussed need to show complete compliance with meds, HD and office visits as well as tight control of DM. If she can maintain this we will refer back for re-evaluation. She is doing so much better now that she has family support for caring for her daughter.   2. End stage renal disease  - Volume status much improved on HD  - Continue HD. Followed in [place]. - She is scheduled for AVF 10/28 at [facility]. Explained to patient that her perioperative risk is at least moderately increased in view of her cardiac history.   3. DMI - Follows with Endocrine - Hgb A1c 9% earlier this month  4. HTN - BP controlled. - GDMT as above  5. N/V - suspect DM gastroparesis but emyptying study ok in 6/23 - RHC did not support low output state - GI suspected possible cyclical vomiting syndrome - Improving  6. RIJ DVT - on Eliquis 2.5 bid - no bleeding  7. RA density -Linear mobile density noted in RA on echoes in 10/24, 09/24 and 02/24. Possible thrombin sheath from prior line insertion. No evidence of endocarditis. -Will review studies with Dr. Gala Romney. Extension of RIJ thrombus?   Follow-up: Keep follow-up as scheduled next month  Carlyne Keehan N, PA-C  4:27 PM

## 2023-06-26 NOTE — Progress Notes (Signed)
 Vascular Surgery Daily Progress Note  06/26/2023  Subjective:  Patient reports that she is having some nausea this morning which is a chronic issue for her.  She had to have a rapid response called yesterday evening around 620.  She also had multiple rapid responses called on her throughout the day including with her dialysis treatment.  She had a CT scan of her chest abdomen and pelvis that did not show any acute events.  No complaints of any pain or discomfort in her left upper extremity.  Objective:  Vital signs in last 24 hours: Temp:  [96.9 F (36.1 C)-98.2 F (36.8 C)] 97.9 F (36.6 C) Heart Rate:  [75-108] 90 Resp:  [16-20] 18 BP: (107-218)/(54-144) 128/77  Intake/Output last 3 shifts:  I/O last 3 completed shifts: In: 660 [P.O.:360; I.V.:300] Out: -   Labs: Lab Results  Component Value Date   BUN 61 (H) 06/26/2023   CO2 17 (L) 06/26/2023   CREATININE 6.97 (H) 06/26/2023   HCT 36.2 06/25/2023   HGB 11.8 (L) 06/25/2023   PLT 127 (L) 06/25/2023   K 6.7 (HH) 06/26/2023   WBC 4.85 06/25/2023   Results from last 7 days  Lab Units 06/25/23 1910 06/25/23 1645  WHITE BLOOD CELL COUNT 10*3/uL 4.85 5.17  HEMOGLOBIN g/dL 88.1* 88.0*  HEMATOCRIT % 36.2 35.9  PLATELET COUNT 10*3/uL 127* 121*  NEUTROPHILS RELATIVE PERCENT %  --  81  MONOCYTES RELATIVE PERCENT %  --  6  EOSINOPHILS RELATIVE PERCENT %  --  0   Results from last 7 days  Lab Units 06/26/23 0818 06/25/23 1910 06/25/23 1645  SODIUM mmol/L 132* 136 135*  POTASSIUM mmol/L 6.7* 5.2* 4.8*  CHLORIDE mmol/L 92* 97* 97*  CO2 mmol/L 17* 22 26  BUN mg/dL 61* 40* 36*  CREATININE mg/dL 3.02* 4.31* 4.95*  CALCIUM  mg/dL 9.3 9.4 9.4  TOTAL PROTEIN g/dL  --   --  7.5  BILIRUBIN TOTAL mg/dL  --   --  1.3*  ALT U/L  --   --  25  AST U/L  --   --  33  GLUCOSE mg/dL 556* 862* 872*    Physical Exam: Vitals:   06/26/23 0755  BP: 128/77  Pulse: 90  Resp: 18  Temp: 97.9 F (36.6 C)  SpO2: 93%      Constitutional - No acute distress, sitting up with basin due to nausea Skin - no rashes, no rashes or injuries or wounds of the left upper extremity Head and Face - Face is symmetrical, no lesions, abrasions, or scars Ears, nose, mouth, and throat - no drainage from the ears, oral and nasal mucosa moist Eyes - Conjunctiva and lids:  no swelling, erythema, or discharge Neck - Supple, symmetric.  Trachea midline.  No masses, no JVD Respiratory - Respiratory effort:  No increased work of breathing or signs of respiratory distress Neurologic - Face is symmetric, speech is normal and clear.  No focal deficits noted. Psychiatric - Oriented to person, place, and time.  Mood and affect is appropriate. Musculoskeletal -   no edema in the upper extremities bilaterally.  Strength 4+ out of 5 left hand with grip PV-2+ palpable left radial pulse  Assessment/Plan: 27 y.o. female with end-stage renal disease on dialysis on TTS schedule under the care of Dr. Carlette.  Multiple admissions for intractable nausea, vomiting, overload, and documented nonadherence to outpatient dialysis therapy.  Due to hyperkalemia we are unable to proceed with AV fistula yesterday 06/25/2023.  Plan was  for a left upper extremity brachiobasilic AV fistula versus brachial to axillary AV graft depending on imaging done in the operating room.  Will reschedule her surgery.  She would like to proceed with intervention on 07/13/2023.  The patient has been posted and our office will contact her as an outpatient to arrange intervention.  Any questions or concerns by the time she may give our office a call.  Electronically signed by: Kathy Washington Niece, PA-C 06/26/2023 9:15 AM

## 2023-06-30 ENCOUNTER — Emergency Department (HOSPITAL_COMMUNITY)
Admission: EM | Admit: 2023-06-30 | Discharge: 2023-06-30 | Disposition: A | Discharge status: Home / Self Care | Payer: 59 | Attending: Emergency Medicine | Admitting: Emergency Medicine

## 2023-06-30 ENCOUNTER — Other Ambulatory Visit: Payer: Self-pay

## 2023-06-30 DIAGNOSIS — N186 End stage renal disease: Secondary | ICD-10-CM | POA: Insufficient documentation

## 2023-06-30 DIAGNOSIS — F419 Anxiety disorder, unspecified: Secondary | ICD-10-CM | POA: Insufficient documentation

## 2023-06-30 DIAGNOSIS — R1013 Epigastric pain: Secondary | ICD-10-CM | POA: Diagnosis present

## 2023-06-30 DIAGNOSIS — Z7901 Long term (current) use of anticoagulants: Secondary | ICD-10-CM | POA: Diagnosis not present

## 2023-06-30 DIAGNOSIS — E1022 Type 1 diabetes mellitus with diabetic chronic kidney disease: Secondary | ICD-10-CM | POA: Insufficient documentation

## 2023-06-30 DIAGNOSIS — Z992 Dependence on renal dialysis: Secondary | ICD-10-CM | POA: Insufficient documentation

## 2023-06-30 LAB — CBC WITH DIFFERENTIAL/PLATELET
Abs Immature Granulocytes: 0.01 10*3/uL (ref 0.00–0.07)
Basophils Absolute: 0 10*3/uL (ref 0.0–0.1)
Basophils Relative: 1 %
Eosinophils Absolute: 0 10*3/uL (ref 0.0–0.5)
Eosinophils Relative: 2 %
HCT: 37.3 % (ref 36.0–46.0)
Hemoglobin: 11.8 g/dL — ABNORMAL LOW (ref 12.0–15.0)
Immature Granulocytes: 0 %
Lymphocytes Relative: 22 %
Lymphs Abs: 0.6 10*3/uL — ABNORMAL LOW (ref 0.7–4.0)
MCH: 31.7 pg (ref 26.0–34.0)
MCHC: 31.6 g/dL (ref 30.0–36.0)
MCV: 100.3 fL — ABNORMAL HIGH (ref 80.0–100.0)
Monocytes Absolute: 0.2 10*3/uL (ref 0.1–1.0)
Monocytes Relative: 8 %
Neutro Abs: 1.8 10*3/uL (ref 1.7–7.7)
Neutrophils Relative %: 67 %
Platelets: 142 10*3/uL — ABNORMAL LOW (ref 150–400)
RBC: 3.72 MIL/uL — ABNORMAL LOW (ref 3.87–5.11)
RDW: 16.2 % — ABNORMAL HIGH (ref 11.5–15.5)
WBC: 2.6 10*3/uL — ABNORMAL LOW (ref 4.0–10.5)
nRBC: 0 % (ref 0.0–0.2)

## 2023-06-30 LAB — COMPREHENSIVE METABOLIC PANEL
ALT: 18 U/L (ref 0–44)
AST: 23 U/L (ref 15–41)
Albumin: 3.1 g/dL — ABNORMAL LOW (ref 3.5–5.0)
Alkaline Phosphatase: 77 U/L (ref 38–126)
Anion gap: 12 (ref 5–15)
BUN: 20 mg/dL (ref 6–20)
CO2: 25 mmol/L (ref 22–32)
Calcium: 7.6 mg/dL — ABNORMAL LOW (ref 8.9–10.3)
Chloride: 99 mmol/L (ref 98–111)
Creatinine, Ser: 4.9 mg/dL — ABNORMAL HIGH (ref 0.44–1.00)
GFR, Estimated: 12 mL/min — ABNORMAL LOW (ref >60)
Glucose, Bld: 271 mg/dL — ABNORMAL HIGH (ref 70–99)
Potassium: 4.1 mmol/L (ref 3.5–5.1)
Sodium: 136 mmol/L (ref 135–145)
Total Bilirubin: 0.9 mg/dL (ref 0.3–1.2)
Total Protein: 6.3 g/dL — ABNORMAL LOW (ref 6.5–8.1)

## 2023-06-30 LAB — CBG MONITORING, ED: Glucose-Capillary: 287 mg/dL — ABNORMAL HIGH (ref 70–99)

## 2023-06-30 LAB — LIPASE, BLOOD: Lipase: 25 U/L (ref 11–51)

## 2023-06-30 MED ORDER — OXYCODONE HCL 5 MG PO TABS
5.0000 mg | ORAL_TABLET | Freq: Once | ORAL | Status: AC
  Administered 2023-06-30: 5 mg via ORAL
  Filled 2023-06-30: qty 1

## 2023-06-30 NOTE — Discharge Instructions (Signed)
You are seen in the emergency department today with concerns of anxiety and abdominal pain.  Your labs are thankfully reassuring without any acute findings concerning for electrolyte abnormalities.  I would recommend following up with your primary care provider to discuss your anxiety medication regimen and also discuss your dialysis sessions with your nephrologist.  If you have any worsening in symptoms, you may return to the emergency department.

## 2023-06-30 NOTE — ED Provider Triage Note (Signed)
Emergency Medicine Provider Triage Evaluation Note  Alfreida Steffenhagen , a 27 y.o. female  was evaluated in triage.  Pt complains of panic attack, abdominal pain.  Reports that she began to notice some epigastric abdominal pain several hours ago and is not improved.  She endorses chest tightness but denies any recent coughing, fevers, or chills.  Denies vomiting or diarrhea but is having some chills.  She says she feels that she has been having some hyperventilation and tingling in her hands since fullness feeling faint.  Review of Systems  Positive: As above Negative: As above  Physical Exam  BP (!) 122/95 (BP Location: Right Arm)   Pulse 75   Temp 98.2 F (36.8 C) (Oral)   Resp 18   SpO2 95%  Gen:   Awake, no distress   Resp:  Normal effort  MSK:   Moves extremities without difficulty  Other:    Medical Decision Making  Medically screening exam initiated at 5:11 PM.  Appropriate orders placed.  Gionna Polak was informed that the remainder of the evaluation will be completed by another provider, this initial triage assessment does not replace that evaluation, and the importance of remaining in the ED until their evaluation is complete.     Smitty Knudsen, PA-C 06/30/23 1712

## 2023-06-30 NOTE — ED Notes (Signed)
Got patient vitals did EKG shown to er provider

## 2023-06-30 NOTE — ED Provider Notes (Signed)
[place] EMERGENCY DEPARTMENT AT [facility] Provider Note   CSN: [id number] Arrival date & time: 06/30/23  1645     History Chief Complaint  Patient presents with   Panic Attack   Abdominal Pain    Kathy Lewis is a 27 y.o. female.  Patient presents to the emergency department with concerns of a panic attack but also feeling that she is having epigastric abdominal pain.  Has had some nausea but denies any vomiting or diarrhea.  Patient is a type I diabetic and states that her monitor has been reading error codes today.  Unsure exactly what her level is.  She receives dialysis on Tuesday, Thursday, Saturday and was out of dialysis session earlier today and no complications with it.  States that she went home and was still feeling okay but several hours later began to experience this discomfort.  Does have a known history of anxiety and takes clonazepam but states that this is not alleviating her symptoms.  Denies any fevers, chills, chest pain or shortness of breath.   Abdominal Pain      Home Medications Prior to Admission medications   Medication Sig Start Date End Date Taking? Authorizing Provider  acetaminophen (TYLENOL) 500 MG tablet Take 1,000 mg by mouth daily as needed for headache (pain).    [provider]  amitriptyline (ELAVIL) 50 MG tablet Take 1 tablet (50 mg total) by mouth at bedtime. 08/30/22   Shanna Cisco, NP  atorvastatin (LIPITOR) 40 MG tablet Take 1 tablet (40 mg total) by mouth every evening. 05/07/23   Bensimhon, Bevelyn Buckles, MD  B Complex-C-Folic Acid (RENA-VITE RX) 1 MG TABS Take 1 tablet by mouth daily. 01/01/23   [provider]  butalbital-acetaminophen-caffeine (FIORICET) 50-325-40 MG tablet Take 1 tablet by mouth every 6 (six) hours as needed. 11/15/21   [provider]  calcium acetate (PHOSLO) 667 MG capsule Take 1,334 mg by mouth 3 (three) times daily with meals. 09/28/22   [provider]  carvedilol  (COREG) 25 MG tablet Take 1 tablet (25 mg total) by mouth 2 (two) times daily with a meal. 05/07/23   Bensimhon, Bevelyn Buckles, MD  clonazePAM (KLONOPIN) 0.5 MG tablet Take 1 tablet (0.5 mg total) by mouth 2 (two) times daily as needed for anxiety. Patient taking differently: Take 0.5 mg by mouth at bedtime. 03/16/22   Leroy Sea, MD  diazepam (VALIUM) 2 MG tablet Take 1 tablet (2 mg total) by mouth every 6 (six) hours as needed (vomiting). 05/28/23   Arthor Captain, PA-C  dicyclomine (BENTYL) 20 MG tablet Take 1 tablet (20 mg total) by mouth 2 (two) times daily as needed for spasms (Abdominal cramps). 06/09/23   Zannie Cove, MD  ELIQUIS 2.5 MG TABS tablet Take 2.5 mg by mouth 2 (two) times daily.    [provider]  fentaNYL (DURAGESIC) 25 MCG/HR Place 1 patch onto the skin every 3 (three) days. 06/11/23   Zannie Cove, MD  hydrALAZINE (APRESOLINE) 100 MG tablet Take 1 tablet (100 mg total) by mouth 2 (two) times daily. 05/07/23   Bensimhon, Bevelyn Buckles, MD  hydrOXYzine (ATARAX) 10 MG tablet Take 10 mg by mouth daily. Also taken before dialysis on Tues, Thurs, Sat.    [provider]  insulin lispro (HUMALOG) 100 UNIT/ML injection Inject 0.5 Units into the skin See admin instructions. 0.5 units per hour via insulin pump. 01/08/22   [provider]  isosorbide mononitrate (IMDUR) 30 MG 24 hr tablet  Take 1 tablet (30 mg total) by mouth daily. 10/09/22 11/20/23  Arrien, York Ram, MD  magnesium oxide (MAG-OX) 400 MG tablet Take 2 tablets (800 mg total) by mouth daily. 05/07/23   Bensimhon, Bevelyn Buckles, MD  methocarbamol (ROBAXIN) 500 MG tablet Take 500 mg by mouth as needed for muscle spasms.    [provider]  omeprazole (PRILOSEC) 20 MG capsule Take 20 mg by mouth daily. 04/11/23   [provider]  sacubitril-valsartan (ENTRESTO) 24-26 MG Take 1 tablet by mouth 2 (two) times daily.    [provider]  SUMAtriptan (IMITREX) 50 MG tablet Take 50 mg by  mouth daily as needed for headache.    [provider]  valACYclovir (VALTREX) 500 MG tablet Take 500 mg by mouth daily as needed (cold sores). 04/17/22   [provider]  Vitamin D, Ergocalciferol, (DRISDOL) 1.25 MG (50000 UNIT) CAPS capsule Take 50,000 Units by mouth every Sunday.    [provider]  escitalopram (LEXAPRO) 10 MG tablet Take 20 mg by mouth daily.  05/10/20 10/06/20  [provider]  furosemide (LASIX) 40 MG tablet Take 1 tablet (40 mg total) by mouth daily. Patient not taking: Reported on 02/16/2021 10/28/19 02/17/21  Geryl Rankins, MD  lisinopril (ZESTRIL) 10 MG tablet Take 10 mg by mouth daily.  02/23/20 10/06/20  [provider]  spironolactone (ALDACTONE) 25 MG tablet Take 25 mg by mouth daily.  04/20/20 10/06/20  [provider]      Allergies    Cantaloupe extract allergy skin test, Citrullus vulgaris, Food, Strawberry extract, and Nsaids    Review of Systems   Review of Systems  Gastrointestinal:  Positive for abdominal pain.  All other systems reviewed and are negative.   Physical Exam Updated Vital Signs BP (!) 123/93   Pulse 88   Temp 97.7 F (36.5 C)   Resp 16   SpO2 97%  Physical Exam Vitals and nursing note reviewed.  Constitutional:      General: She is not in acute distress.    Appearance: She is well-developed.  HENT:     Head: Normocephalic and atraumatic.  Eyes:     Conjunctiva/sclera: Conjunctivae normal.  Cardiovascular:     Rate and Rhythm: Normal rate and regular rhythm.     Heart sounds: No murmur heard. Pulmonary:     Effort: Pulmonary effort is normal. No respiratory distress.     Breath sounds: Normal breath sounds.  Abdominal:     Palpations: Abdomen is soft.     Tenderness: There is abdominal tenderness in the epigastric area.  Musculoskeletal:        General: No swelling.     Cervical back: Neck supple.  Skin:    General: Skin is warm and dry.     Capillary Refill: Capillary  refill takes less than 2 seconds.  Neurological:     Mental Status: She is alert.  Psychiatric:        Mood and Affect: Mood normal.     ED Results / Procedures / Treatments   Labs (all labs ordered are listed, but only abnormal results are displayed) Labs Reviewed  CBC WITH DIFFERENTIAL/PLATELET - Abnormal; Notable for the following components:      Result Value   WBC 2.6 (*)    RBC 3.72 (*)    Hemoglobin 11.8 (*)    MCV 100.3 (*)    RDW 16.2 (*)    Platelets 142 (*)    Lymphs Abs 0.6 (*)  All other components within normal limits  COMPREHENSIVE METABOLIC PANEL - Abnormal; Notable for the following components:   Glucose, Bld 271 (*)    Creatinine, Ser 4.90 (*)    Calcium 7.6 (*)    Total Protein 6.3 (*)    Albumin 3.1 (*)    GFR, Estimated 12 (*)    All other components within normal limits  CBG MONITORING, ED - Abnormal; Notable for the following components:   Glucose-Capillary 287 (*)    All other components within normal limits  LIPASE, BLOOD    EKG None  Radiology No results found.  Procedures Procedures   Medications Ordered in ED Medications  oxyCODONE (Oxy IR/ROXICODONE) immediate release tablet 5 mg (5 mg Oral Given 06/30/23 1848)    ED Course/ Medical Decision Making/ A&P                               Medical Decision Making Amount and/or Complexity of Data Reviewed Labs: ordered.  Risk Prescription drug management.   This patient presents to the ED for concern of abdominal pain, panic attack.  Differential diagnosis includes pancreatitis, bowel obstruction, gastroenteritis, UTI   Lab Tests:  I Ordered, and personally interpreted labs.  The pertinent results include: CBC with baseline anemia with RBCs of 3.72 and hemoglobin 11.8, leukopenia 2.6 also baseline, CMP with no evidence of electrolyte disturbances but obvious impairment in the kidneys noted with creatinine of 4.9 and GFR of 12, glucose elevated to 87, lipase  unremarkable   Medicines ordered and prescription drug management:  I ordered medication including oxycodone for pain Reevaluation of the patient after these medicines showed that the patient improved I have reviewed the patients home medicines and have made adjustments as needed   Problem List / ED Course:  Patient presents to the emergency department concerns of a panic attack and epigastric abdominal pain.  States that she was at dialysis session earlier today and had no complications with this but several hours later while she was at home woke up from a nap and began to feel increased anxiety and epigastric pain.  Clonazepam states that she took this medication without improvement in symptoms.  Currently denies any chest pain or shortness of breath but has persistent nausea but no vomiting or diarrhea.  Patient is typically receiving dialysis Tuesday Thursdays and Saturdays.  Given history of ESRD, will perform lab workup to ensure electrolyte normality and no acute findings on CBC.  Also order lipase. Zofran given by EMS to some improvement in symptoms.  Ordered Percocet for pain control. Labs are largely unremarkable.  Obvious kidney impairment noted but no severe electrolyte derangements and potassium only at 4.1.  CBC unremarkable with baseline leukopenia and anemia.  Lipase negative, CBG elevated to 287 but improving compared to prior. On reassessment patient approximate 1 hour after initial assessment, she reports significant proved after administration of the Percocet.  No longer having abdominal pain and feels that her anxiety is relieved.  Will anticipate discharge home with close primary care follow-up as well as nephrology follow-up.  Patient agreement plan verbalized understanding return precautions.  Patient discharged home in stable condition.  Social Determinants of Health:  ESRD Anxiety  Final Clinical Impression(s) / ED Diagnoses Final diagnoses:  Anxiety  Epigastric  pain    Rx / DC Orders ED Discharge Orders     None         Smitty Knudsen, PA-C 06/30/23  1926    Anders Simmonds T, DO 06/30/23 1952

## 2023-06-30 NOTE — ED Triage Notes (Signed)
EMS called to patient's home for panic attack-hyperventilating, tingling hands and feeling faint. Patient then began to have abdominal pain, n/v/d. CBG 358. Insulin pump giving malfunction message. 4mg  zofran, 100 mcg fentanyl given by EMS. T, Th, S dialysis patient who completed her treatment this morning.

## 2023-06-30 NOTE — ED Notes (Signed)
Checked patient cbg it was 66 notified RN Sarah of blood sugar

## 2023-07-11 ENCOUNTER — Encounter (HOSPITAL_COMMUNITY): Payer: Self-pay

## 2023-07-11 ENCOUNTER — Emergency Department (HOSPITAL_COMMUNITY)
Admission: EM | Admit: 2023-07-11 | Discharge: 2023-07-11 | Disposition: A | Discharge status: Home / Self Care | Payer: 59 | Attending: Emergency Medicine | Admitting: Emergency Medicine

## 2023-07-11 DIAGNOSIS — Z992 Dependence on renal dialysis: Secondary | ICD-10-CM | POA: Insufficient documentation

## 2023-07-11 DIAGNOSIS — E1122 Type 2 diabetes mellitus with diabetic chronic kidney disease: Secondary | ICD-10-CM | POA: Diagnosis not present

## 2023-07-11 DIAGNOSIS — Z79899 Other long term (current) drug therapy: Secondary | ICD-10-CM | POA: Diagnosis not present

## 2023-07-11 DIAGNOSIS — Z794 Long term (current) use of insulin: Secondary | ICD-10-CM | POA: Insufficient documentation

## 2023-07-11 DIAGNOSIS — G8929 Other chronic pain: Secondary | ICD-10-CM | POA: Diagnosis not present

## 2023-07-11 DIAGNOSIS — I120 Hypertensive chronic kidney disease with stage 5 chronic kidney disease or end stage renal disease: Secondary | ICD-10-CM | POA: Diagnosis not present

## 2023-07-11 DIAGNOSIS — R7401 Elevation of levels of liver transaminase levels: Secondary | ICD-10-CM | POA: Diagnosis not present

## 2023-07-11 DIAGNOSIS — N186 End stage renal disease: Secondary | ICD-10-CM | POA: Diagnosis not present

## 2023-07-11 DIAGNOSIS — R109 Unspecified abdominal pain: Secondary | ICD-10-CM | POA: Diagnosis present

## 2023-07-11 DIAGNOSIS — Z7901 Long term (current) use of anticoagulants: Secondary | ICD-10-CM | POA: Insufficient documentation

## 2023-07-11 DIAGNOSIS — E875 Hyperkalemia: Secondary | ICD-10-CM | POA: Insufficient documentation

## 2023-07-11 LAB — COMPREHENSIVE METABOLIC PANEL
ALT: 63 U/L — ABNORMAL HIGH (ref 0–44)
AST: 94 U/L — ABNORMAL HIGH (ref 15–41)
Albumin: 3.8 g/dL (ref 3.5–5.0)
Alkaline Phosphatase: 128 U/L — ABNORMAL HIGH (ref 38–126)
Anion gap: 13 (ref 5–15)
BUN: 54 mg/dL — ABNORMAL HIGH (ref 6–20)
CO2: 26 mmol/L (ref 22–32)
Calcium: 9.3 mg/dL (ref 8.9–10.3)
Chloride: 99 mmol/L (ref 98–111)
Creatinine, Ser: 6.61 mg/dL — ABNORMAL HIGH (ref 0.44–1.00)
GFR, Estimated: 8 mL/min — ABNORMAL LOW (ref >60)
Glucose, Bld: 202 mg/dL — ABNORMAL HIGH (ref 70–99)
Potassium: 6 mmol/L — ABNORMAL HIGH (ref 3.5–5.1)
Sodium: 138 mmol/L (ref 135–145)
Total Bilirubin: 1.1 mg/dL (ref <1.2)
Total Protein: 7.5 g/dL (ref 6.5–8.1)

## 2023-07-11 LAB — CBC
HCT: 35.7 % — ABNORMAL LOW (ref 36.0–46.0)
Hemoglobin: 11.3 g/dL — ABNORMAL LOW (ref 12.0–15.0)
MCH: 32.8 pg (ref 26.0–34.0)
MCHC: 31.7 g/dL (ref 30.0–36.0)
MCV: 103.8 fL — ABNORMAL HIGH (ref 80.0–100.0)
Platelets: 142 10*3/uL — ABNORMAL LOW (ref 150–400)
RBC: 3.44 MIL/uL — ABNORMAL LOW (ref 3.87–5.11)
RDW: 15.9 % — ABNORMAL HIGH (ref 11.5–15.5)
WBC: 3.8 10*3/uL — ABNORMAL LOW (ref 4.0–10.5)
nRBC: 0 % (ref 0.0–0.2)

## 2023-07-11 LAB — HCG, SERUM, QUALITATIVE: Preg, Serum: NEGATIVE

## 2023-07-11 LAB — LIPASE, BLOOD: Lipase: 27 U/L (ref 11–51)

## 2023-07-11 LAB — CBG MONITORING, ED
Glucose-Capillary: 148 mg/dL — ABNORMAL HIGH (ref 70–99)
Glucose-Capillary: 119 mg/dL — ABNORMAL HIGH (ref 70–99)

## 2023-07-11 LAB — POTASSIUM: Potassium: 5.9 mmol/L — ABNORMAL HIGH (ref 3.5–5.1)

## 2023-07-11 MED ORDER — ONDANSETRON HCL 4 MG/2ML IJ SOLN
4.0000 mg | Freq: Once | INTRAMUSCULAR | Status: AC
  Administered 2023-07-11: 4 mg via INTRAVENOUS
  Filled 2023-07-11: qty 2

## 2023-07-11 MED ORDER — INSULIN ASPART 100 UNIT/ML IV SOLN
5.0000 [IU] | Freq: Once | INTRAVENOUS | Status: AC
  Administered 2023-07-11: 5 [IU] via INTRAVENOUS
  Filled 2023-07-11: qty 0.05

## 2023-07-11 MED ORDER — SENNOSIDES-DOCUSATE SODIUM 8.6-50 MG PO TABS: 1.0000 | ORAL_TABLET | Freq: Every day | ORAL | 0 refills | Status: DC

## 2023-07-11 MED ORDER — FAMOTIDINE 20 MG PO TABS: 20.0000 mg | ORAL_TABLET | Freq: Two times a day (BID) | ORAL | 0 refills | Status: DC

## 2023-07-11 MED ORDER — DICYCLOMINE HCL 20 MG PO TABS: 20.0000 mg | ORAL_TABLET | Freq: Two times a day (BID) | ORAL | 0 refills | Status: DC

## 2023-07-11 MED ORDER — OXYCODONE HCL 5 MG PO TABS: 5.0000 mg | ORAL_TABLET | Freq: Four times a day (QID) | ORAL | 0 refills | Status: DC | PRN

## 2023-07-11 MED ORDER — FAMOTIDINE 20 MG PO TABS
20.0000 mg | ORAL_TABLET | Freq: Once | ORAL | Status: AC
  Administered 2023-07-11: 20 mg via ORAL
  Filled 2023-07-11: qty 1

## 2023-07-11 MED ORDER — SODIUM ZIRCONIUM CYCLOSILICATE 10 G PO PACK
10.0000 g | PACK | Freq: Once | ORAL | Status: AC
  Administered 2023-07-11: 10 g via ORAL
  Filled 2023-07-11: qty 1

## 2023-07-11 MED ORDER — DEXTROSE 50 % IV SOLN
1.0000 | Freq: Once | INTRAVENOUS | Status: AC
  Administered 2023-07-11: 50 mL via INTRAVENOUS
  Filled 2023-07-11: qty 50

## 2023-07-11 MED ORDER — DICYCLOMINE HCL 10 MG PO CAPS
10.0000 mg | ORAL_CAPSULE | Freq: Once | ORAL | Status: AC
Start: 2023-07-11 — End: 2023-07-11
  Administered 2023-07-11: 10 mg via ORAL
  Filled 2023-07-11: qty 1

## 2023-07-11 MED ORDER — DIPHENHYDRAMINE HCL 50 MG/ML IJ SOLN
25.0000 mg | Freq: Once | INTRAMUSCULAR | Status: AC
  Administered 2023-07-11: 25 mg via INTRAVENOUS
  Filled 2023-07-11: qty 1

## 2023-07-11 MED ORDER — FENTANYL CITRATE PF 50 MCG/ML IJ SOSY
50.0000 ug | PREFILLED_SYRINGE | Freq: Once | INTRAMUSCULAR | Status: AC
  Administered 2023-07-11: 50 ug via INTRAVENOUS
  Filled 2023-07-11: qty 1

## 2023-07-11 NOTE — ED Notes (Signed)
Nurse notified of O2 destat.

## 2023-07-11 NOTE — ED Provider Notes (Signed)
[place] EMERGENCY DEPARTMENT AT [facility] Provider Note   CSN: [id number] Arrival date & time: 07/11/23  1428     History  Chief Complaint  Patient presents with   Abdominal Pain   Emesis    Kathy Lewis is a 27 y.o. female.  Patient is a 27 year old female with a past medical history of ESRD on TTS HD, hypertension, diabetes, VTE on Eliquis and chronic abdominal pain presenting to the emergency department for her chronic pain.  The patient reports that she had worsening of her chronic pain this morning with associated nausea.  States she has not vomited.  Denies any fevers.  She states that she has been constipated with her last bowel movement yesterday however still passing gas.  She states the pain feels similar to her prior pain.  Did have recent imaging about 2 weeks ago that showed no signs of gallstones or obvious etiology of her pain as well as a normal EGD 2 weeks ago.  She states that she has had gastric emptying studies in the past that did not show evidence of gastroparesis.  She states that she is planned to see a pain doctor but owes money at the clinic and is unable to see them until she pays all of her bills.  The history is provided by the patient.  Abdominal Pain Associated symptoms: vomiting   Emesis Associated symptoms: abdominal pain        Home Medications Prior to Admission medications   Medication Sig Start Date End Date Taking? Authorizing Provider  dicyclomine (BENTYL) 20 MG tablet Take 1 tablet (20 mg total) by mouth 2 (two) times daily. 07/11/23  Yes Theresia Lo, Benetta Spar K, DO  famotidine (PEPCID) 20 MG tablet Take 1 tablet (20 mg total) by mouth 2 (two) times daily. 07/11/23  Yes Theresia Lo, Benetta Spar K, DO  oxyCODONE (ROXICODONE) 5 MG immediate release tablet Take 1 tablet (5 mg total) by mouth every 6 (six) hours as needed for severe pain (pain score 7-10). 07/11/23  Yes Kingsley, Turkey K, DO  senna-docusate (SENOKOT-S) 8.6-50 MG  tablet Take 1 tablet by mouth daily. 07/11/23  Yes Elayne Snare K, DO  acetaminophen (TYLENOL) 500 MG tablet Take 1,000 mg by mouth daily as needed for headache (pain).    [provider]  amitriptyline (ELAVIL) 50 MG tablet Take 1 tablet (50 mg total) by mouth at bedtime. 08/30/22   Shanna Cisco, NP  atorvastatin (LIPITOR) 40 MG tablet Take 1 tablet (40 mg total) by mouth every evening. 05/07/23   Bensimhon, Bevelyn Buckles, MD  B Complex-C-Folic Acid (RENA-VITE RX) 1 MG TABS Take 1 tablet by mouth daily. 01/01/23   [provider]  butalbital-acetaminophen-caffeine (FIORICET) 50-325-40 MG tablet Take 1 tablet by mouth every 6 (six) hours as needed. 11/15/21   [provider]  calcium acetate (PHOSLO) 667 MG capsule Take 1,334 mg by mouth 3 (three) times daily with meals. 09/28/22   [provider]  carvedilol (COREG) 25 MG tablet Take 1 tablet (25 mg total) by mouth 2 (two) times daily with a meal. 05/07/23   Bensimhon, Bevelyn Buckles, MD  clonazePAM (KLONOPIN) 0.5 MG tablet Take 1 tablet (0.5 mg total) by mouth 2 (two) times daily as needed for anxiety. Patient taking differently: Take 0.5 mg by mouth at bedtime. 03/16/22   Leroy Sea, MD  diazepam (VALIUM) 2 MG tablet Take 1 tablet (2 mg total) by mouth every 6 (six) hours as needed (vomiting). 05/28/23   Tiburcio Pea,  Abigail, PA-C  dicyclomine (BENTYL) 20 MG tablet Take 1 tablet (20 mg total) by mouth 2 (two) times daily as needed for spasms (Abdominal cramps). 06/09/23   Zannie Cove, MD  ELIQUIS 2.5 MG TABS tablet Take 2.5 mg by mouth 2 (two) times daily.    [provider]  fentaNYL (DURAGESIC) 25 MCG/HR Place 1 patch onto the skin every 3 (three) days. 06/11/23   Zannie Cove, MD  hydrALAZINE (APRESOLINE) 100 MG tablet Take 1 tablet (100 mg total) by mouth 2 (two) times daily. 05/07/23   Bensimhon, Bevelyn Buckles, MD  hydrOXYzine (ATARAX) 10 MG tablet Take 10 mg by mouth daily. Also taken before dialysis on  Tues, Thurs, Sat.    [provider]  insulin lispro (HUMALOG) 100 UNIT/ML injection Inject 0.5 Units into the skin See admin instructions. 0.5 units per hour via insulin pump. 01/08/22   [provider]  isosorbide mononitrate (IMDUR) 30 MG 24 hr tablet Take 1 tablet (30 mg total) by mouth daily. 10/09/22 11/20/23  Arrien, York Ram, MD  magnesium oxide (MAG-OX) 400 MG tablet Take 2 tablets (800 mg total) by mouth daily. 05/07/23   Bensimhon, Bevelyn Buckles, MD  methocarbamol (ROBAXIN) 500 MG tablet Take 500 mg by mouth as needed for muscle spasms.    [provider]  omeprazole (PRILOSEC) 20 MG capsule Take 20 mg by mouth daily. 04/11/23   [provider]  sacubitril-valsartan (ENTRESTO) 24-26 MG Take 1 tablet by mouth 2 (two) times daily.    [provider]  SUMAtriptan (IMITREX) 50 MG tablet Take 50 mg by mouth daily as needed for headache.    [provider]  valACYclovir (VALTREX) 500 MG tablet Take 500 mg by mouth daily as needed (cold sores). 04/17/22   [provider]  Vitamin D, Ergocalciferol, (DRISDOL) 1.25 MG (50000 UNIT) CAPS capsule Take 50,000 Units by mouth every Sunday.    [provider]  escitalopram (LEXAPRO) 10 MG tablet Take 20 mg by mouth daily.  05/10/20 10/06/20  [provider]  furosemide (LASIX) 40 MG tablet Take 1 tablet (40 mg total) by mouth daily. Patient not taking: Reported on 02/16/2021 10/28/19 02/17/21  Geryl Rankins, MD  lisinopril (ZESTRIL) 10 MG tablet Take 10 mg by mouth daily.  02/23/20 10/06/20  [provider]  spironolactone (ALDACTONE) 25 MG tablet Take 25 mg by mouth daily.  04/20/20 10/06/20  [provider]      Allergies    Cantaloupe extract allergy skin test, Citrullus vulgaris, Food, Strawberry extract, and Nsaids    Review of Systems   Review of Systems  Gastrointestinal:  Positive for abdominal pain and vomiting.    Physical Exam Updated Vital Signs BP  (!) 151/124   Pulse 75   Temp 97.9 F (36.6 C)   Resp 16   SpO2 97%  Physical Exam Vitals and nursing note reviewed.  Constitutional:      General: She is not in acute distress.    Appearance: She is well-developed.  HENT:     Head: Normocephalic and atraumatic.     Mouth/Throat:     Mouth: Mucous membranes are moist.  Eyes:     Extraocular Movements: Extraocular movements intact.  Cardiovascular:     Rate and Rhythm: Normal rate and regular rhythm.     Heart sounds: Normal heart sounds.  Pulmonary:     Effort: Pulmonary effort is normal.     Breath sounds: Normal breath sounds.  Abdominal:  General: Abdomen is flat.     Palpations: Abdomen is soft.     Tenderness: There is abdominal tenderness in the epigastric area. There is no guarding or rebound.  Skin:    General: Skin is warm and dry.  Neurological:     General: No focal deficit present.     Mental Status: She is alert and oriented to person, place, and time.  Psychiatric:        Mood and Affect: Mood normal.        Behavior: Behavior normal.     ED Results / Procedures / Treatments   Labs (all labs ordered are listed, but only abnormal results are displayed) Labs Reviewed  COMPREHENSIVE METABOLIC PANEL - Abnormal; Notable for the following components:      Result Value   Potassium 6.0 (*)    Glucose, Bld 202 (*)    BUN 54 (*)    Creatinine, Ser 6.61 (*)    AST 94 (*)    ALT 63 (*)    Alkaline Phosphatase 128 (*)    GFR, Estimated 8 (*)    All other components within normal limits  CBC - Abnormal; Notable for the following components:   WBC 3.8 (*)    RBC 3.44 (*)    Hemoglobin 11.3 (*)    HCT 35.7 (*)    MCV 103.8 (*)    RDW 15.9 (*)    Platelets 142 (*)    All other components within normal limits  POTASSIUM - Abnormal; Notable for the following components:   Potassium 5.9 (*)    All other components within normal limits  CBG MONITORING, ED - Abnormal; Notable for the following components:    Glucose-Capillary 148 (*)    All other components within normal limits  CBG MONITORING, ED - Abnormal; Notable for the following components:   Glucose-Capillary 119 (*)    All other components within normal limits  LIPASE, BLOOD  HCG, SERUM, QUALITATIVE  URINALYSIS, ROUTINE W REFLEX MICROSCOPIC    EKG EKG Interpretation Date/Time:  Wednesday July 11 2023 16:39:39 EST Ventricular Rate:  71 PR Interval:  164 QRS Duration:  104 QT Interval:  446 QTC Calculation: 485 R Axis:   132  Text Interpretation: Sinus rhythm Left atrial enlargement Right axis deviation Nonspecific repol abnormality, diffuse leads No significant change since last tracing Confirmed by Elayne Snare ([id number]) on 07/11/2023 4:50:46 PM  Radiology No results found.  Procedures Procedures    Medications Ordered in ED Medications  sodium zirconium cyclosilicate (LOKELMA) packet 10 g (10 g Oral Given 07/11/23 1632)  ondansetron (ZOFRAN) injection 4 mg (4 mg Intravenous Given 07/11/23 1633)  fentaNYL (SUBLIMAZE) injection 50 mcg (50 mcg Intravenous Given 07/11/23 1632)  famotidine (PEPCID) tablet 20 mg (20 mg Oral Given 07/11/23 1632)  dicyclomine (BENTYL) capsule 10 mg (10 mg Oral Given 07/11/23 1632)  diphenhydrAMINE (BENADRYL) injection 25 mg (25 mg Intravenous Given 07/11/23 1656)  insulin aspart (novoLOG) injection 5 Units (5 Units Intravenous Given 07/11/23 1747)    And  dextrose 50 % solution 50 mL (50 mLs Intravenous Given 07/11/23 1740)    ED Course/ Medical Decision Making/ A&P Clinical Course as of 07/11/23 2247  Wed Jul 11, 2023  1650 No hyperkalemic changes on EKG. Repeat K pending. [VK]  1709 Rpt K 5.9, likely true hyperkalemia. Is due for HD tomorrow morning and will be given temporization.  [VK]  1852 Repeat glucose stable, patient's pain is improved.  She is scheduled for dialysis tomorrow  morning.  She is stable for discharge home with outpatient follow-up and was given strict return  precautions. [VK]    Clinical Course User Index [VK] Rexford Maus, DO                                 Medical Decision Making This patient presents to the ED with chief complaint(s) of chronic abdominal pain with pertinent past medical history of ESRD on TTS HD, hypertension, diabetes, VTE on Eliquis and chronic abdominal pain which further complicates the presenting complaint. The complaint involves an extensive differential diagnosis and also carries with it a high risk of complications and morbidity.    The differential diagnosis includes chronic pain syndrome, gastritis, GERD, pancreatitis, hepatitis, constipation, gastroparesis, hyperglycemic crisis   Additional history obtained: Additional history obtained from N/A Records reviewed previous admission documents and Care Everywhere/External Records  ED Course and Reassessment: On patient's arrival she is hemodynamically stable in no acute distress.  She is initially evaluated in triage and had screening labs performed.  Labs showed mild hypokalemia, will have EKG to evaluate for hyperkalemic changes.  She did have a full dialysis session yesterday and will repeat potassium to evaluate for hemolysis.  She was given [facility] and placed on EKG will determine if she needs further temporization.  She has mildly elevated LFTs but had normal liver and gallbladder imaging done 2 weeks ago making an acute hepatobiliary process unlikely.  Patient will be given pain and nausea control and will be closely reassessed.  Independent labs interpretation:  The following labs were independently interpreted: mild hypokalemia and mild transaminitis otherwise labs at baseline  Independent visualization of imaging: - N/A  Consultation: - Consulted or discussed management/test interpretation w/ external professional: N/A  Consideration for admission or further workup: Patient has no emergent conditions requiring admission or further work-up at this  time and is stable for discharge home with primary care follow-up  Social Determinants of health: N/A    Amount and/or Complexity of Data Reviewed Labs: ordered.  Risk OTC drugs. Prescription drug management.          Final Clinical Impression(s) / ED Diagnoses Final diagnoses:  Chronic abdominal pain  Transaminitis  Hyperkalemia    Rx / DC Orders ED Discharge Orders          Ordered    dicyclomine (BENTYL) 20 MG tablet  2 times daily        07/11/23 1855    famotidine (PEPCID) 20 MG tablet  2 times daily        07/11/23 1855    oxyCODONE (ROXICODONE) 5 MG immediate release tablet  Every 6 hours PRN        07/11/23 1855    senna-docusate (SENOKOT-S) 8.6-50 MG tablet  Daily        07/11/23 1855              Rexford Maus, DO 07/11/23 2247

## 2023-07-11 NOTE — ED Triage Notes (Signed)
Per EMS, Pt, from home, c/o abdominal pain and n/v starting this morning.  Hx of chronic abdominal pain.    Pt had a full dialysis yesterday.

## 2023-07-11 NOTE — Discharge Instructions (Addendum)
You were seen in the emergency department for your abdominal pain.  You had no signs of new infection and that appears to be consistent with your chronic pain.  I have given you an antacid and you should take this daily for at least the next few weeks to see if it helps with your symptoms.  You can take Tylenol every 6 hours and Bentyl as needed for cramping.  I have given you a few oxycodone to take as needed for breakthrough pain.  This can make you more constipated so make sure that you are taking a stool softener with this.  This can also make you drowsy so do not take it before driving, working or operating heavy machinery. You should follow up with your primary doctor or GI doctor to have your symptoms rechecked. Your potassium level was slightly high and you should get dialysis tomorrow as scheduled. You should return to the emergency department for fevers, significantly worsening pain, repetitive vomiting or any other new or concerning symptoms.

## 2023-07-13 ENCOUNTER — Other Ambulatory Visit: Payer: Self-pay

## 2023-07-13 ENCOUNTER — Observation Stay (HOSPITAL_COMMUNITY)
Admission: EM | Admit: 2023-07-13 | Discharge: 2023-07-14 | Disposition: A | Discharge status: Home / Self Care | Payer: 59 | Attending: Internal Medicine | Admitting: Internal Medicine

## 2023-07-13 ENCOUNTER — Encounter (HOSPITAL_COMMUNITY): Payer: Self-pay | Admitting: Emergency Medicine

## 2023-07-13 DIAGNOSIS — R112 Nausea with vomiting, unspecified: Secondary | ICD-10-CM | POA: Insufficient documentation

## 2023-07-13 DIAGNOSIS — F11159 Opioid abuse with opioid-induced psychotic disorder, unspecified: Secondary | ICD-10-CM | POA: Diagnosis not present

## 2023-07-13 DIAGNOSIS — Z8679 Personal history of other diseases of the circulatory system: Secondary | ICD-10-CM | POA: Insufficient documentation

## 2023-07-13 DIAGNOSIS — I4589 Other specified conduction disorders: Secondary | ICD-10-CM | POA: Insufficient documentation

## 2023-07-13 DIAGNOSIS — G9349 Other encephalopathy: Secondary | ICD-10-CM | POA: Diagnosis not present

## 2023-07-13 DIAGNOSIS — R1084 Generalized abdominal pain: Secondary | ICD-10-CM

## 2023-07-13 DIAGNOSIS — D500 Iron deficiency anemia secondary to blood loss (chronic): Secondary | ICD-10-CM | POA: Insufficient documentation

## 2023-07-13 DIAGNOSIS — I5022 Chronic systolic (congestive) heart failure: Secondary | ICD-10-CM | POA: Diagnosis not present

## 2023-07-13 DIAGNOSIS — R109 Unspecified abdominal pain: Secondary | ICD-10-CM | POA: Diagnosis present

## 2023-07-13 DIAGNOSIS — E785 Hyperlipidemia, unspecified: Secondary | ICD-10-CM | POA: Diagnosis present

## 2023-07-13 DIAGNOSIS — K3184 Gastroparesis: Secondary | ICD-10-CM | POA: Diagnosis present

## 2023-07-13 DIAGNOSIS — Z992 Dependence on renal dialysis: Secondary | ICD-10-CM | POA: Insufficient documentation

## 2023-07-13 DIAGNOSIS — N186 End stage renal disease: Secondary | ICD-10-CM

## 2023-07-13 DIAGNOSIS — E669 Obesity, unspecified: Secondary | ICD-10-CM | POA: Diagnosis not present

## 2023-07-13 DIAGNOSIS — R531 Weakness: Secondary | ICD-10-CM | POA: Diagnosis present

## 2023-07-13 DIAGNOSIS — I132 Hypertensive heart and chronic kidney disease with heart failure and with stage 5 chronic kidney disease, or end stage renal disease: Secondary | ICD-10-CM | POA: Insufficient documentation

## 2023-07-13 DIAGNOSIS — Z86718 Personal history of other venous thrombosis and embolism: Secondary | ICD-10-CM | POA: Insufficient documentation

## 2023-07-13 DIAGNOSIS — D72829 Elevated white blood cell count, unspecified: Secondary | ICD-10-CM | POA: Insufficient documentation

## 2023-07-13 DIAGNOSIS — I10 Essential (primary) hypertension: Secondary | ICD-10-CM | POA: Diagnosis present

## 2023-07-13 DIAGNOSIS — Z87891 Personal history of nicotine dependence: Secondary | ICD-10-CM | POA: Insufficient documentation

## 2023-07-13 DIAGNOSIS — Z794 Long term (current) use of insulin: Secondary | ICD-10-CM | POA: Insufficient documentation

## 2023-07-13 DIAGNOSIS — R9431 Abnormal electrocardiogram [ECG] [EKG]: Secondary | ICD-10-CM | POA: Diagnosis present

## 2023-07-13 DIAGNOSIS — Z79899 Other long term (current) drug therapy: Secondary | ICD-10-CM | POA: Diagnosis not present

## 2023-07-13 DIAGNOSIS — D7589 Other specified diseases of blood and blood-forming organs: Secondary | ICD-10-CM | POA: Insufficient documentation

## 2023-07-13 DIAGNOSIS — E10649 Type 1 diabetes mellitus with hypoglycemia without coma: Secondary | ICD-10-CM

## 2023-07-13 DIAGNOSIS — E1010 Type 1 diabetes mellitus with ketoacidosis without coma: Secondary | ICD-10-CM | POA: Insufficient documentation

## 2023-07-13 DIAGNOSIS — E875 Hyperkalemia: Secondary | ICD-10-CM | POA: Diagnosis not present

## 2023-07-13 DIAGNOSIS — R4182 Altered mental status, unspecified: Secondary | ICD-10-CM | POA: Diagnosis present

## 2023-07-13 DIAGNOSIS — E1022 Type 1 diabetes mellitus with diabetic chronic kidney disease: Secondary | ICD-10-CM | POA: Insufficient documentation

## 2023-07-13 DIAGNOSIS — R1110 Vomiting, unspecified: Secondary | ICD-10-CM | POA: Diagnosis present

## 2023-07-13 DIAGNOSIS — E1065 Type 1 diabetes mellitus with hyperglycemia: Secondary | ICD-10-CM | POA: Diagnosis not present

## 2023-07-13 LAB — CBC WITH DIFFERENTIAL/PLATELET
Abs Immature Granulocytes: 0.02 10*3/uL (ref 0.00–0.07)
Basophils Absolute: 0 10*3/uL (ref 0.0–0.1)
Basophils Relative: 0 %
Eosinophils Absolute: 0 10*3/uL (ref 0.0–0.5)
Eosinophils Relative: 0 %
HCT: 38.2 % (ref 36.0–46.0)
Hemoglobin: 12 g/dL (ref 12.0–15.0)
Immature Granulocytes: 0 %
Lymphocytes Relative: 7 %
Lymphs Abs: 0.5 10*3/uL — ABNORMAL LOW (ref 0.7–4.0)
MCH: 33 pg (ref 26.0–34.0)
MCHC: 31.4 g/dL (ref 30.0–36.0)
MCV: 104.9 fL — ABNORMAL HIGH (ref 80.0–100.0)
Monocytes Absolute: 0.2 10*3/uL (ref 0.1–1.0)
Monocytes Relative: 3 %
Neutro Abs: 6.5 10*3/uL (ref 1.7–7.7)
Neutrophils Relative %: 90 %
Platelets: 152 10*3/uL (ref 150–400)
RBC: 3.64 MIL/uL — ABNORMAL LOW (ref 3.87–5.11)
RDW: 15.9 % — ABNORMAL HIGH (ref 11.5–15.5)
WBC: 7.2 10*3/uL (ref 4.0–10.5)
nRBC: 0 % (ref 0.0–0.2)

## 2023-07-13 LAB — BLOOD GAS, VENOUS
Acid-Base Excess: 1.7 mmol/L (ref 0.0–2.0)
Bicarbonate: 26.6 mmol/L (ref 20.0–28.0)
O2 Saturation: 85.4 %
Patient temperature: 37
pCO2, Ven: 42 mm[Hg] — ABNORMAL LOW (ref 44–60)
pH, Ven: 7.41 (ref 7.25–7.43)
pO2, Ven: 57 mm[Hg] — ABNORMAL HIGH (ref 32–45)

## 2023-07-13 LAB — GLUCOSE, CAPILLARY: Glucose-Capillary: 200 mg/dL — ABNORMAL HIGH (ref 70–99)

## 2023-07-13 LAB — COMPREHENSIVE METABOLIC PANEL
ALT: 53 U/L — ABNORMAL HIGH (ref 0–44)
AST: 59 U/L — ABNORMAL HIGH (ref 15–41)
Albumin: 4.1 g/dL (ref 3.5–5.0)
Alkaline Phosphatase: 120 U/L (ref 38–126)
Anion gap: >20 — ABNORMAL HIGH (ref 5–15)
BUN: 41 mg/dL — ABNORMAL HIGH (ref 6–20)
CO2: 21 mmol/L — ABNORMAL LOW (ref 22–32)
Calcium: 9.3 mg/dL (ref 8.9–10.3)
Chloride: 93 mmol/L — ABNORMAL LOW (ref 98–111)
Creatinine, Ser: 5.85 mg/dL — ABNORMAL HIGH (ref 0.44–1.00)
GFR, Estimated: 10 mL/min — ABNORMAL LOW (ref >60)
Glucose, Bld: 231 mg/dL — ABNORMAL HIGH (ref 70–99)
Potassium: 6.2 mmol/L — ABNORMAL HIGH (ref 3.5–5.1)
Sodium: 135 mmol/L (ref 135–145)
Total Bilirubin: 1.6 mg/dL — ABNORMAL HIGH (ref <1.2)
Total Protein: 8 g/dL (ref 6.5–8.1)

## 2023-07-13 LAB — URINALYSIS, ROUTINE W REFLEX MICROSCOPIC
Bilirubin Urine: NEGATIVE
Glucose, UA: >=500 mg/dL — AB
Hgb urine dipstick: NEGATIVE
Ketones, ur: 5 mg/dL — AB
Leukocytes,Ua: NEGATIVE
Nitrite: NEGATIVE
Protein, ur: >=300 mg/dL — AB
Specific Gravity, Urine: 1.013 (ref 1.005–1.030)
pH: 7 (ref 5.0–8.0)

## 2023-07-13 LAB — PHOSPHORUS: Phosphorus: 6.3 mg/dL — ABNORMAL HIGH (ref 2.5–4.6)

## 2023-07-13 LAB — LIPASE, BLOOD: Lipase: 18 U/L (ref 11–51)

## 2023-07-13 LAB — CBG MONITORING, ED
Glucose-Capillary: 216 mg/dL — ABNORMAL HIGH (ref 70–99)
Glucose-Capillary: 285 mg/dL — ABNORMAL HIGH (ref 70–99)
Glucose-Capillary: 262 mg/dL — ABNORMAL HIGH (ref 70–99)

## 2023-07-13 LAB — HCG, SERUM, QUALITATIVE: Preg, Serum: NEGATIVE

## 2023-07-13 LAB — HEPATITIS B SURFACE ANTIGEN: Hepatitis B Surface Ag: NONREACTIVE

## 2023-07-13 IMAGING — CR DG CHEST 2V
2 series · 2 of 2 positions shown · non-contrast
Comparison: 08/11/2021

CLINICAL DATA: Cough.  Congestion for 1 week.

EXAM:
CHEST - 2 VIEW

[w chest pa]
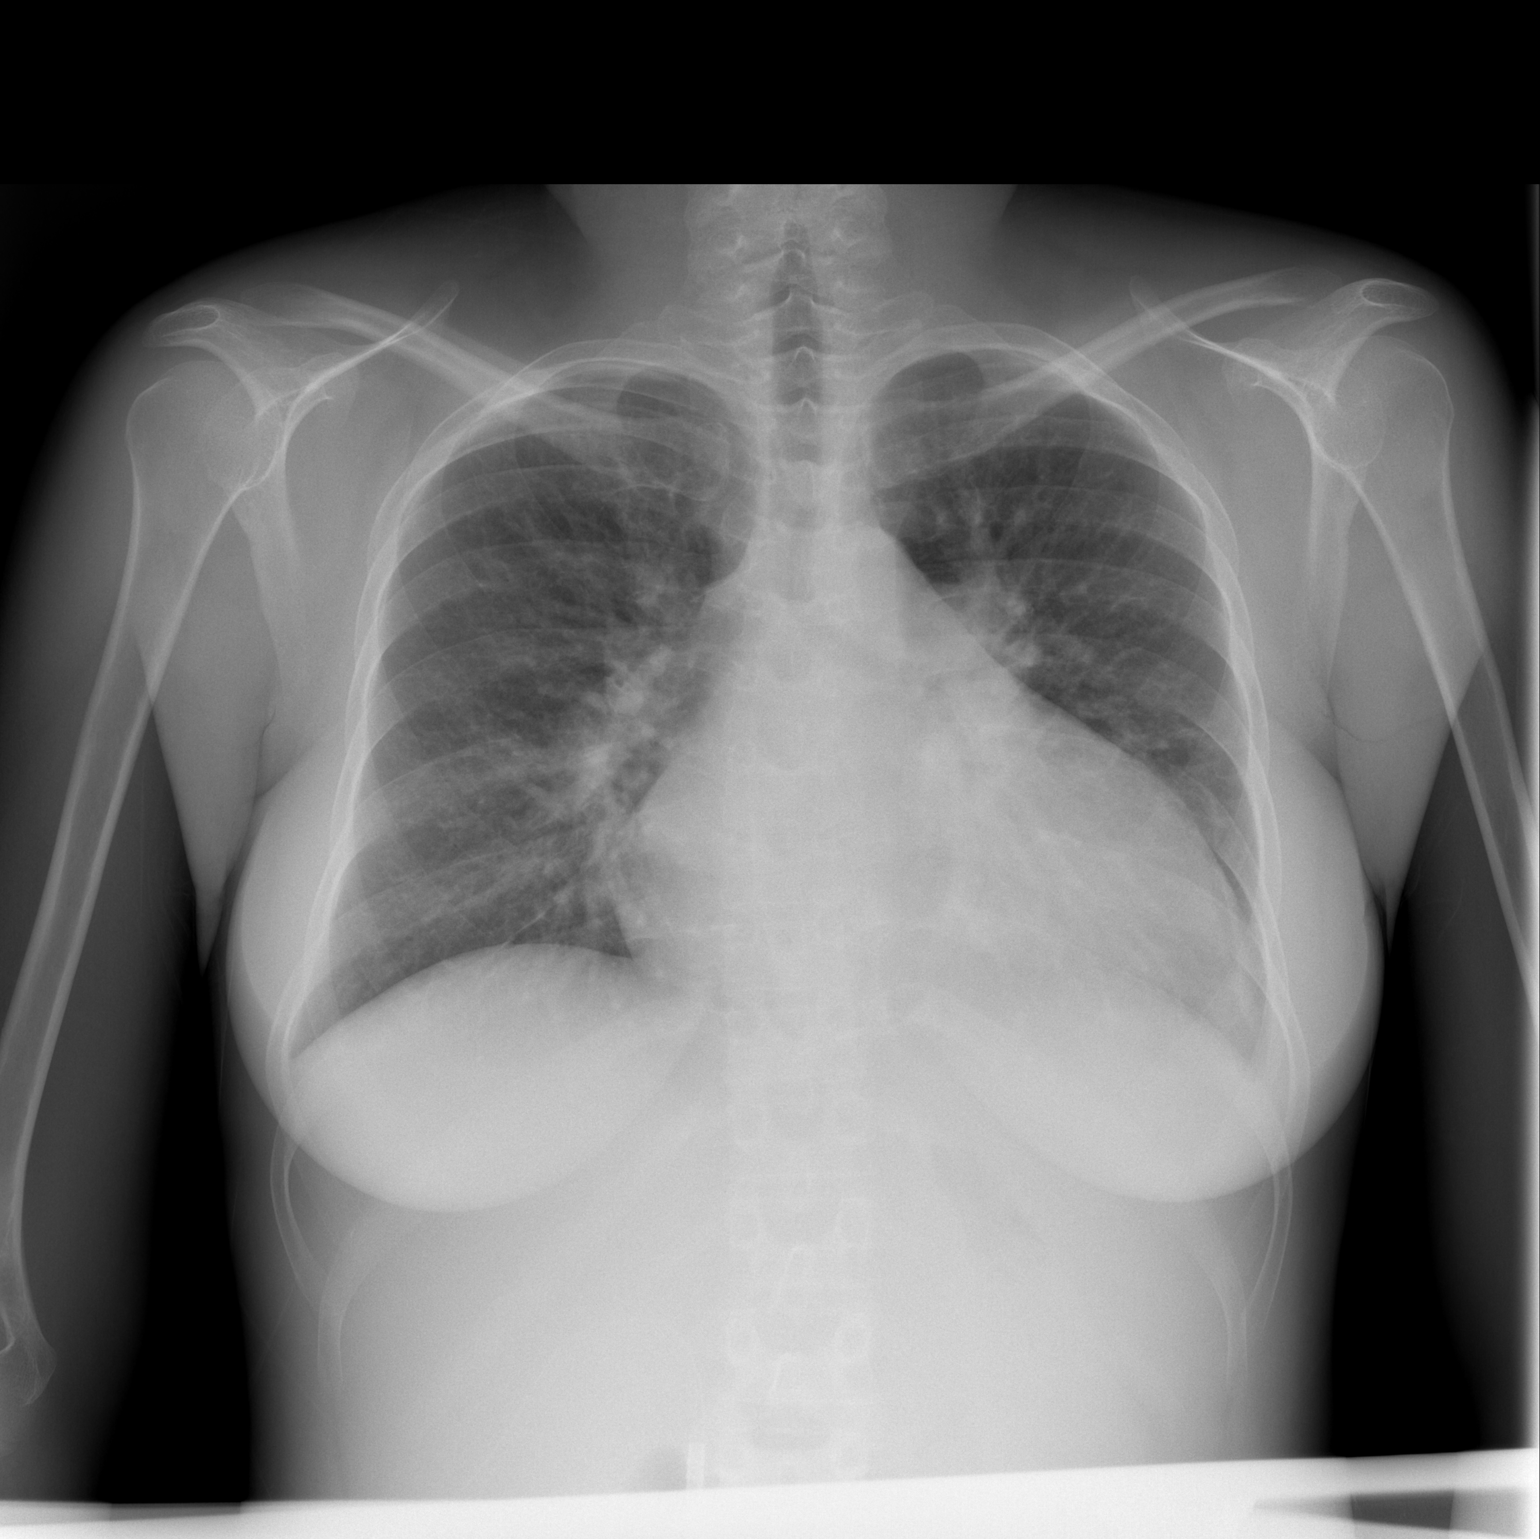

[w chest lat]
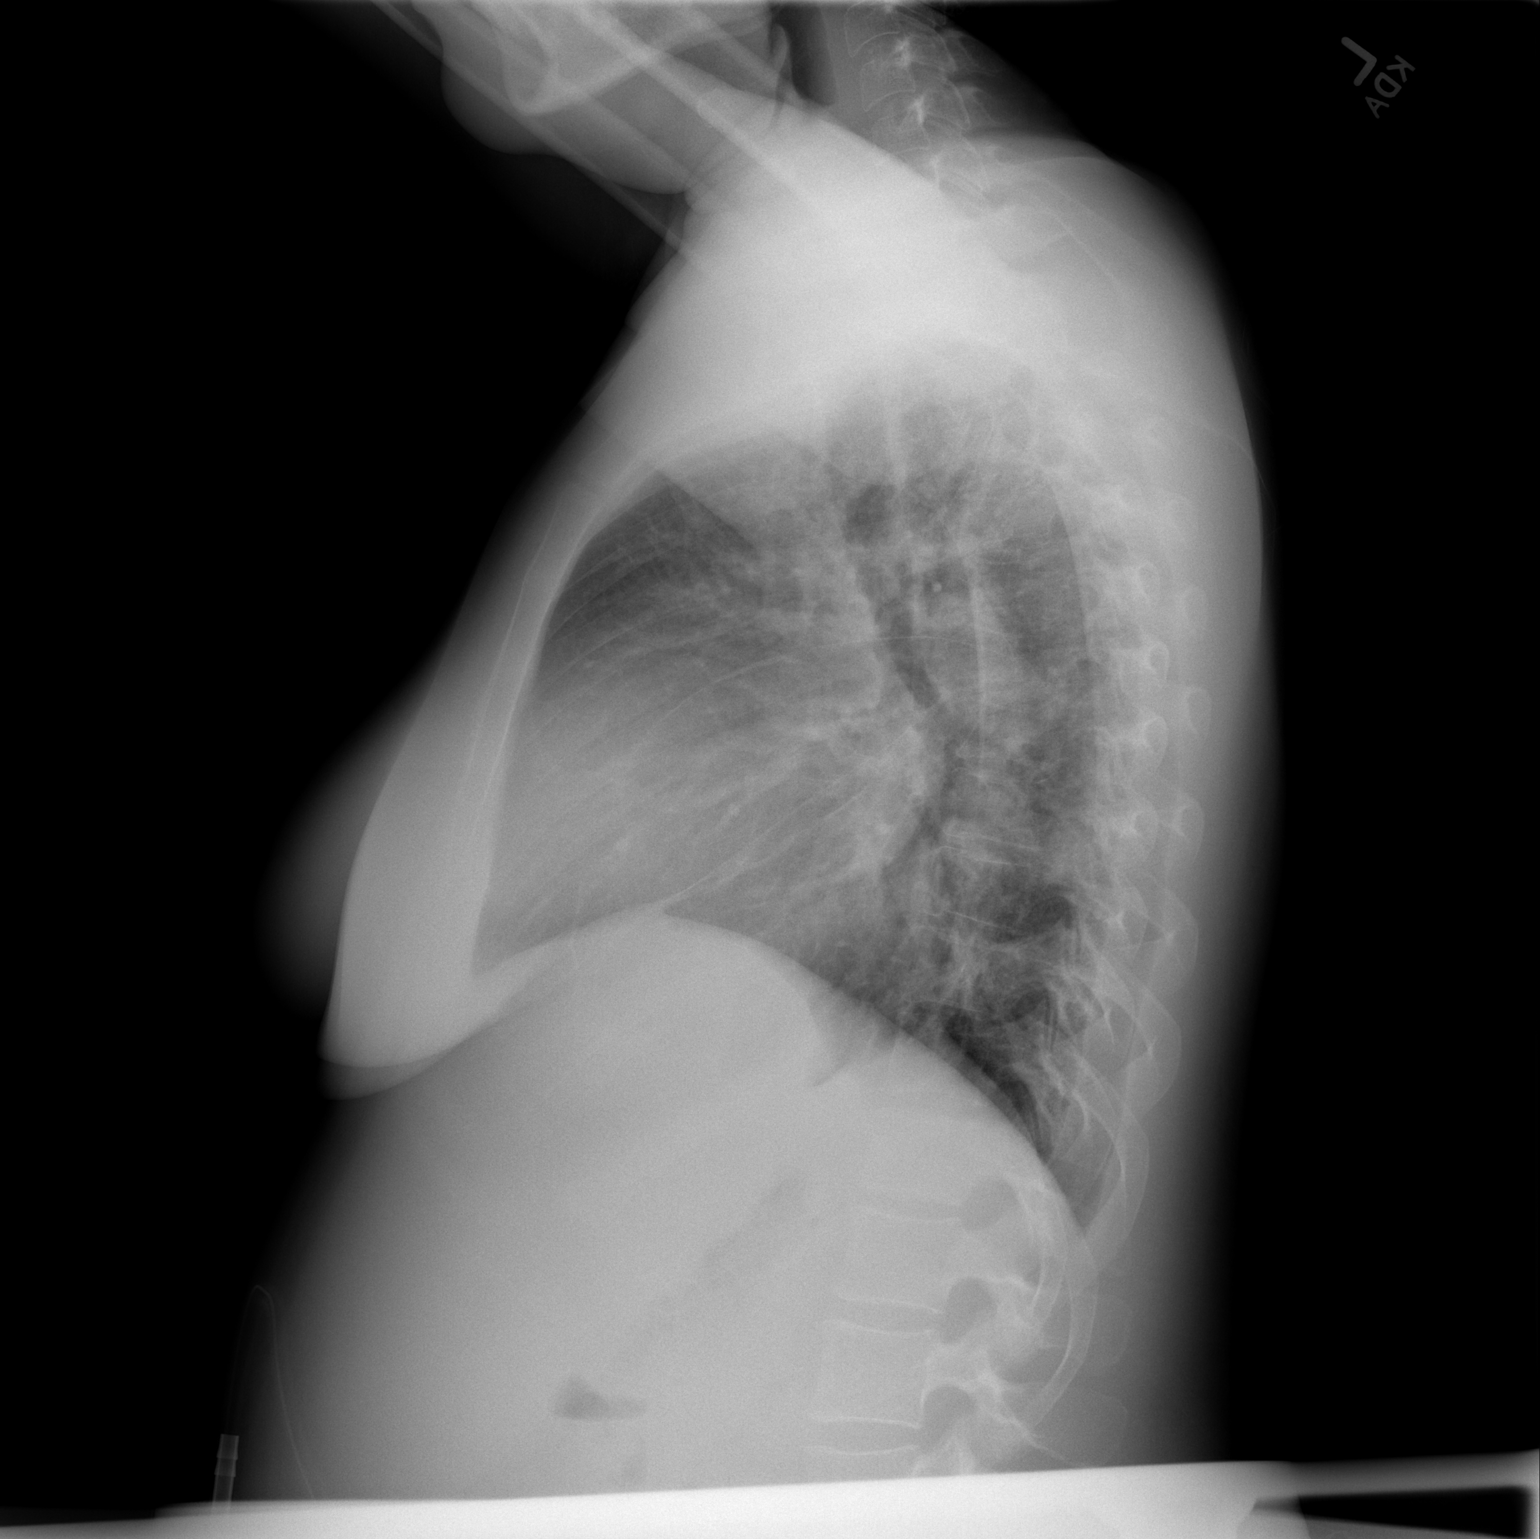

[2 of 2 positions shown; findings below may reference images not displayed]

FINDINGS: Unchanged cardiac enlargement. No signs of pleural effusion or
edema. No airspace opacities identified. Visualized osseous
structures appear intact.
IMPRESSION: Cardiac enlargement.  No signs of heart failure.

## 2023-07-13 MED ORDER — PROCHLORPERAZINE EDISYLATE 10 MG/2ML IJ SOLN: 5.0000 mg | INTRAMUSCULAR | Status: DC | PRN

## 2023-07-13 MED ORDER — ATORVASTATIN CALCIUM 40 MG PO TABS
40.0000 mg | ORAL_TABLET | Freq: Every evening | ORAL | Status: DC
  Administered 2023-07-13: 40 mg via ORAL
  Filled 2023-07-13: qty 1

## 2023-07-13 MED ORDER — INSULIN GLARGINE-YFGN 100 UNIT/ML ~~LOC~~ SOLN
12.0000 [IU] | Freq: Every day | SUBCUTANEOUS | Status: DC
  Administered 2023-07-13: 12 [IU] via SUBCUTANEOUS
  Filled 2023-07-13 (×2): qty 0.12

## 2023-07-13 MED ORDER — PROCHLORPERAZINE EDISYLATE 10 MG/2ML IJ SOLN
10.0000 mg | Freq: Once | INTRAMUSCULAR | Status: AC
  Administered 2023-07-13: 10 mg via INTRAVENOUS
  Filled 2023-07-13: qty 2

## 2023-07-13 MED ORDER — SACUBITRIL-VALSARTAN 24-26 MG PO TABS
1.0000 | ORAL_TABLET | Freq: Two times a day (BID) | ORAL | Status: DC
  Administered 2023-07-13 – 2023-07-14 (×2): 1 via ORAL
  Filled 2023-07-13 (×3): qty 1

## 2023-07-13 MED ORDER — DIPHENHYDRAMINE HCL 50 MG/ML IJ SOLN
12.5000 mg | Freq: Once | INTRAMUSCULAR | Status: AC
  Administered 2023-07-13: 12.5 mg via INTRAVENOUS
  Filled 2023-07-13: qty 1

## 2023-07-13 MED ORDER — CARVEDILOL 25 MG PO TABS
25.0000 mg | ORAL_TABLET | Freq: Two times a day (BID) | ORAL | Status: DC
  Administered 2023-07-13: 25 mg via ORAL
  Filled 2023-07-13: qty 1

## 2023-07-13 MED ORDER — LORAZEPAM 2 MG/ML IJ SOLN
0.5000 mg | Freq: Once | INTRAMUSCULAR | Status: DC
  Filled 2023-07-13: qty 1

## 2023-07-13 MED ORDER — SUCROFERRIC OXYHYDROXIDE 500 MG PO CHEW
1000.0000 mg | CHEWABLE_TABLET | Freq: Three times a day (TID) | ORAL | Status: DC
  Administered 2023-07-13: 1000 mg via ORAL
  Filled 2023-07-13 (×4): qty 2

## 2023-07-13 MED ORDER — ALTEPLASE 2 MG IJ SOLR: 2.0000 mg | Freq: Once | INTRAMUSCULAR | Status: DC | PRN

## 2023-07-13 MED ORDER — NALOXONE HCL 0.4 MG/ML IJ SOLN
0.4000 mg | Freq: Once | INTRAMUSCULAR | Status: AC
Start: 2023-07-13 — End: 2023-07-13
  Administered 2023-07-13: 0.4 mg via INTRAVENOUS

## 2023-07-13 MED ORDER — LORAZEPAM 2 MG/ML IJ SOLN: 0.5000 mg | Freq: Once | INTRAMUSCULAR | Status: DC

## 2023-07-13 MED ORDER — MORPHINE SULFATE (PF) 4 MG/ML IV SOLN
4.0000 mg | Freq: Once | INTRAVENOUS | Status: DC
  Filled 2023-07-13: qty 1

## 2023-07-13 MED ORDER — HEPARIN SODIUM (PORCINE) 1000 UNIT/ML DIALYSIS
2500.0000 [IU] | Freq: Once | INTRAMUSCULAR | Status: DC
  Filled 2023-07-13 (×2): qty 3

## 2023-07-13 MED ORDER — HEPARIN SODIUM (PORCINE) 1000 UNIT/ML DIALYSIS
1000.0000 [IU] | INTRAMUSCULAR | Status: DC | PRN
  Filled 2023-07-13: qty 1

## 2023-07-13 MED ORDER — SODIUM BICARBONATE 8.4 % IV SOLN
100.0000 meq | Freq: Once | INTRAVENOUS | Status: AC
  Administered 2023-07-13: 100 meq via INTRAVENOUS
  Filled 2023-07-13: qty 100

## 2023-07-13 MED ORDER — APIXABAN 2.5 MG PO TABS
2.5000 mg | ORAL_TABLET | Freq: Two times a day (BID) | ORAL | Status: DC
  Administered 2023-07-13 – 2023-07-14 (×2): 2.5 mg via ORAL
  Filled 2023-07-13 (×2): qty 1

## 2023-07-13 MED ORDER — INSULIN ASPART 100 UNIT/ML IJ SOLN: 0.0000 [IU] | Freq: Three times a day (TID) | INTRAMUSCULAR | Status: DC

## 2023-07-13 MED ORDER — PANTOPRAZOLE SODIUM 40 MG PO TBEC
40.0000 mg | DELAYED_RELEASE_TABLET | Freq: Every day | ORAL | Status: DC
  Administered 2023-07-13 – 2023-07-14 (×2): 40 mg via ORAL
  Filled 2023-07-13 (×2): qty 1

## 2023-07-13 MED ORDER — ACETAMINOPHEN 650 MG RE SUPP: 650.0000 mg | Freq: Four times a day (QID) | RECTAL | Status: DC | PRN

## 2023-07-13 MED ORDER — HEPARIN SODIUM (PORCINE) 1000 UNIT/ML DIALYSIS
1500.0000 [IU] | INTRAMUSCULAR | Status: DC | PRN
  Filled 2023-07-13: qty 2

## 2023-07-13 MED ORDER — HYDROMORPHONE HCL 1 MG/ML IJ SOLN
1.0000 mg | Freq: Once | INTRAMUSCULAR | Status: AC
  Administered 2023-07-13: 1 mg via INTRAVENOUS
  Filled 2023-07-13: qty 1

## 2023-07-13 MED ORDER — HEPARIN SODIUM (PORCINE) 5000 UNIT/ML IJ SOLN
5000.0000 [IU] | Freq: Three times a day (TID) | INTRAMUSCULAR | Status: DC
Start: 2023-07-13 — End: 2023-07-13

## 2023-07-13 MED ORDER — ANTICOAGULANT SODIUM CITRATE 4% (200MG/5ML) IV SOLN: 5.0000 mL | Status: DC | PRN

## 2023-07-13 MED ORDER — MORPHINE SULFATE (PF) 4 MG/ML IV SOLN
4.0000 mg | Freq: Once | INTRAVENOUS | Status: AC
  Administered 2023-07-13: 4 mg via INTRAMUSCULAR

## 2023-07-13 MED ORDER — NALOXONE HCL 0.4 MG/ML IJ SOLN: 0.4000 mg | INTRAMUSCULAR | Status: DC | PRN

## 2023-07-13 MED ORDER — ISOSORBIDE MONONITRATE ER 30 MG PO TB24
30.0000 mg | ORAL_TABLET | Freq: Every day | ORAL | Status: DC
  Administered 2023-07-14: 30 mg via ORAL
  Filled 2023-07-13: qty 1

## 2023-07-13 MED ORDER — PENTAFLUOROPROP-TETRAFLUOROETH EX AERO: 1.0000 | INHALATION_SPRAY | CUTANEOUS | Status: DC | PRN

## 2023-07-13 MED ORDER — NEPRO/CARBSTEADY PO LIQD: 237.0000 mL | ORAL | Status: DC | PRN

## 2023-07-13 MED ORDER — CHLORHEXIDINE GLUCONATE CLOTH 2 % EX PADS
6.0000 | MEDICATED_PAD | Freq: Every day | CUTANEOUS | Status: DC
  Administered 2023-07-14: 6 via TOPICAL

## 2023-07-13 MED ORDER — ACETAMINOPHEN 325 MG PO TABS
650.0000 mg | ORAL_TABLET | Freq: Four times a day (QID) | ORAL | Status: DC | PRN
  Administered 2023-07-13: 650 mg via ORAL
  Filled 2023-07-13: qty 2

## 2023-07-13 MED ORDER — LORAZEPAM 2 MG/ML IJ SOLN
1.0000 mg | Freq: Once | INTRAMUSCULAR | Status: AC
  Administered 2023-07-13: 1 mg via INTRAMUSCULAR

## 2023-07-13 MED ORDER — LIDOCAINE-PRILOCAINE 2.5-2.5 % EX CREA
1.0000 | TOPICAL_CREAM | CUTANEOUS | Status: DC | PRN
Start: 2023-07-13 — End: 2023-07-13

## 2023-07-13 MED ORDER — HYDRALAZINE HCL 50 MG PO TABS
100.0000 mg | ORAL_TABLET | Freq: Two times a day (BID) | ORAL | Status: DC
  Administered 2023-07-13 – 2023-07-14 (×2): 100 mg via ORAL
  Filled 2023-07-13 (×2): qty 2

## 2023-07-13 MED ORDER — LIDOCAINE HCL (PF) 1 % IJ SOLN: 5.0000 mL | INTRAMUSCULAR | Status: DC | PRN

## 2023-07-13 MED ORDER — SODIUM POLYSTYRENE SULFONATE 15 GM/60ML CO SUSP
30.0000 g | Freq: Once | Status: AC
  Administered 2023-07-13: 30 g via RECTAL
  Filled 2023-07-13: qty 120

## 2023-07-13 NOTE — ED Triage Notes (Signed)
Patient brought in by POV by father- patient shaking and not wanting to participate in triage questions. Patient was here two days ago for n/v/ abdominal pain, when RN asked if patient was here for same she said yes. Patient is dialysis patient with access to right chest. Had dialysis yesterday.

## 2023-07-13 NOTE — Consult Note (Signed)
Renal Service Consult Note [facility]  Kathy Lewis 07/13/2023 Kathy Krabbe, MD Requesting Physician: Dr. Robb Matar  Reason for Consult: ESRD pt w/ abdominal pain and N/V HPI: The patient is a 27 y.o. year-old w/ PMH as below who presented to ED w/ c/o nausea, vomiting and abd pain for the last 2 days. In ED BP 180/132, she rec'd IV morphine sulfate, hydromorphone, ativan, kayexalate and 1 amp sod bicarb. Labs showed wbc 7K, Hb 12, K= 6.2. Creat 5.8.  Pt was admitted. We are asked to see for dialysis.   Pt seen in room in ED. No distress, pleasant. Main c/o abd pain and vomiting for a few days. Last HD was yesterday per the patient.    ROS - denies CP, no joint pain, no HA, no blurry vision, no rash, no dysuria, no difficulty voiding   Past Medical History  Past Medical History:  Diagnosis Date   Anemia    Anxiety    Bipolar 2 disorder (HCC)    Chronic kidney disease    Chronic systolic (congestive) heart failure (HCC)    Depression    DKA (diabetic ketoacidoses)    ESRD on peritoneal dialysis (HCC)    HSV infection    on valtrex   Hypokalemia    Leukocytosis    Migraine    Noncompliance with medication regimen    Preeclampsia    Prolonged QT syndrome    Severe anemia    Type 1 diabetes mellitus (HCC)    Past Surgical History  Past Surgical History:  Procedure Laterality Date   BIOPSY  04/24/2022   Procedure: BIOPSY;  Surgeon: Jenel Lucks, MD;  Location: [facility] ENDOSCOPY;  Service: Gastroenterology;;   CARDIAC CATHETERIZATION     COLONOSCOPY WITH PROPOFOL N/A 04/24/2022   Procedure: COLONOSCOPY WITH PROPOFOL;  Surgeon: Jenel Lucks, MD;  Location: [facility] ENDOSCOPY;  Service: Gastroenterology;  Laterality: N/A;   DILATION AND EVACUATION N/A 10/22/2019   Procedure: ULTRASOUND GUIDED DILATATION AND EVACUATION;  Surgeon: Geryl Rankins, MD;  Location: [facility] LD ORS;  Service: Gynecology;  Laterality: N/A;   ESOPHAGOGASTRODUODENOSCOPY (EGD) WITH  PROPOFOL N/A 04/24/2022   Procedure: ESOPHAGOGASTRODUODENOSCOPY (EGD) WITH PROPOFOL;  Surgeon: Jenel Lucks, MD;  Location: [facility] ENDOSCOPY;  Service: Gastroenterology;  Laterality: N/A;   IR FLUORO GUIDE CV LINE RIGHT  09/14/2022   IR US GUIDE VASC ACCESS RIGHT  09/14/2022   peritoneal dialysis catheter insertion     RENAL BIOPSY     Family History  Family History  Adopted: Yes  Problem Relation Age of Onset   Heart disease Neg Hx    Social History  reports that she has quit smoking. Her smoking use included cigars and cigarettes. She has a 2 pack-year smoking history. She has never used smokeless tobacco. She reports that she does not drink alcohol and does not use drugs. Allergies  Allergies  Allergen Reactions   Cantaloupe Extract Allergy Skin Test Itching    Mouth itching     Citrullus Vulgaris Itching and Other (See Comments)    Makes mouth itch, ALL melons     Food Itching    All melon - mouth itching   Strawberry Extract Itching    Mouth itching   Nsaids Itching and Other (See Comments)    Avoid per nephrology   Home medications Prior to Admission medications   Medication Sig Start Date End Date Taking? Authorizing Provider  acetaminophen (TYLENOL) 500 MG tablet Take 1,000 mg by mouth daily as needed  for headache (pain).    [provider]  amitriptyline (ELAVIL) 50 MG tablet Take 1 tablet (50 mg total) by mouth at bedtime. 08/30/22   Shanna Cisco, NP  atorvastatin (LIPITOR) 40 MG tablet Take 1 tablet (40 mg total) by mouth every evening. 05/07/23   Bensimhon, Bevelyn Buckles, MD  B Complex-C-Folic Acid (RENA-VITE RX) 1 MG TABS Take 1 tablet by mouth daily. 01/01/23   [provider]  butalbital-acetaminophen-caffeine (FIORICET) 50-325-40 MG tablet Take 1 tablet by mouth every 6 (six) hours as needed. 11/15/21   [provider]  calcium acetate (PHOSLO) 667 MG capsule Take 1,334 mg by mouth 3 (three) times daily with meals. 09/28/22   [provider]  carvedilol (COREG) 25 MG tablet Take 1 tablet (25 mg total) by mouth 2 (two) times daily with a meal. 05/07/23   Bensimhon, Bevelyn Buckles, MD  clonazePAM (KLONOPIN) 0.5 MG tablet Take 1 tablet (0.5 mg total) by mouth 2 (two) times daily as needed for anxiety. Patient taking differently: Take 0.5 mg by mouth at bedtime. 03/16/22   Leroy Sea, MD  diazepam (VALIUM) 2 MG tablet Take 1 tablet (2 mg total) by mouth every 6 (six) hours as needed (vomiting). 05/28/23   Arthor Captain, PA-C  dicyclomine (BENTYL) 20 MG tablet Take 1 tablet (20 mg total) by mouth 2 (two) times daily as needed for spasms (Abdominal cramps). 06/09/23   Zannie Cove, MD  dicyclomine (BENTYL) 20 MG tablet Take 1 tablet (20 mg total) by mouth 2 (two) times daily. 07/11/23   Elayne Snare K, DO  ELIQUIS 2.5 MG TABS tablet Take 2.5 mg by mouth 2 (two) times daily.    [provider]  famotidine (PEPCID) 20 MG tablet Take 1 tablet (20 mg total) by mouth 2 (two) times daily. 07/11/23   Elayne Snare K, DO  fentaNYL (DURAGESIC) 25 MCG/HR Place 1 patch onto the skin every 3 (three) days. 06/11/23   Zannie Cove, MD  hydrALAZINE (APRESOLINE) 100 MG tablet Take 1 tablet (100 mg total) by mouth 2 (two) times daily. 05/07/23   Bensimhon, Bevelyn Buckles, MD  hydrOXYzine (ATARAX) 10 MG tablet Take 10 mg by mouth daily. Also taken before dialysis on Tues, Thurs, Sat.    [provider]  insulin lispro (HUMALOG) 100 UNIT/ML injection Inject 0.5 Units into the skin See admin instructions. 0.5 units per hour via insulin pump. 01/08/22   [provider]  isosorbide mononitrate (IMDUR) 30 MG 24 hr tablet Take 1 tablet (30 mg total) by mouth daily. 10/09/22 11/20/23  Arrien, York Ram, MD  magnesium oxide (MAG-OX) 400 MG tablet Take 2 tablets (800 mg total) by mouth daily. 05/07/23   Bensimhon, Bevelyn Buckles, MD  methocarbamol (ROBAXIN) 500 MG tablet Take 500 mg by mouth as needed for muscle spasms.     [provider]  omeprazole (PRILOSEC) 20 MG capsule Take 20 mg by mouth daily. 04/11/23   [provider]  oxyCODONE (ROXICODONE) 5 MG immediate release tablet Take 1 tablet (5 mg total) by mouth every 6 (six) hours as needed for severe pain (pain score 7-10). 07/11/23   Elayne Snare K, DO  sacubitril-valsartan (ENTRESTO) 24-26 MG Take 1 tablet by mouth 2 (two) times daily.    [provider]  senna-docusate (SENOKOT-S) 8.6-50 MG tablet Take 1 tablet by mouth daily. 07/11/23   Elayne Snare K, DO  SUMAtriptan (IMITREX) 50 MG tablet Take 50 mg by mouth daily as needed for headache.  [provider]  valACYclovir (VALTREX) 500 MG tablet Take 500 mg by mouth daily as needed (cold sores). 04/17/22   [provider]  Vitamin D, Ergocalciferol, (DRISDOL) 1.25 MG (50000 UNIT) CAPS capsule Take 50,000 Units by mouth every Sunday.    [provider]  escitalopram (LEXAPRO) 10 MG tablet Take 20 mg by mouth daily.  05/10/20 10/06/20  [provider]  furosemide (LASIX) 40 MG tablet Take 1 tablet (40 mg total) by mouth daily. Patient not taking: Reported on 02/16/2021 10/28/19 02/17/21  Geryl Rankins, MD  lisinopril (ZESTRIL) 10 MG tablet Take 10 mg by mouth daily.  02/23/20 10/06/20  [provider]  spironolactone (ALDACTONE) 25 MG tablet Take 25 mg by mouth daily.  04/20/20 10/06/20  [provider]     Vitals:   07/13/23 0845 07/13/23 0945 07/13/23 1110 07/13/23 1115  BP: (!) 176/126 (!) 183/123  118/65  Pulse: 96  74 75  Resp: (!) 24 20 19 11   Temp:   98.1 F (36.7 C)   TempSrc:   Oral   SpO2: 94% 99% 99% 99%   Exam Gen alert, no distress, on 2 L [place] here  No rash, cyanosis or gangrene Sclera anicteric, throat clear  No jvd or bruits Chest clear bilat to bases, no rales/ wheezing RRR no RG Abd mostly soft, ntnd no mass or ascites +bs GU defer MS no joint effusions or deformity Ext no LE or UE edema, no  wounds or ulcers Neuro is alert, Ox 3 , nf     Renal-related home meds: - phoslo 2 ac tid - coreg 25 bid - eliquis 2.5 bid - duragesic patch q 3d - hydralazine 100 bid - oxy IR prn - entresto 24-26 bid - valtrex 500 mg daily prn    OP HD: TTS  SW     3.5h  400/600   56kg  2/2 bath  RIJ TDC   Heparin 2500   Assessment/ Plan: Abd pain - w/ N/V, per pmd ESKD - on HD TTS. Last HD yesterday. Plan iHD today off schedule due to high K+.  Hyperkalemia - K+ 6.2. QRS , was 90 msec in July. Given kayexalate enema. N/V so lokelma not an option. Plan HD as above.  Uncont HTN - bp's high 180/120., max UF w/ HD. Probably not keeping her BP meds down.  Volume - not grossly vol overloaded. Usually UF is around 3- 4 L at oP unit per patient.  Anemia of eskd - Hb 11-12, no esa needs MBD ckd - CCa in range, add on phos. Cont binders w/ meals.  DMT1 - per pmd      Vinson Moselle  MD [facility] 07/13/2023, 12:32 PM  Recent Labs  Lab 07/11/23 1507 07/11/23 1647 07/13/23 0909  HGB 11.3*  --  12.0  ALBUMIN 3.8  --  4.1  CALCIUM 9.3  --  9.3  CREATININE 6.61*  --  5.85*  K 6.0* 5.9* 6.2*   Inpatient medications:  heparin  5,000 Units Subcutaneous Q8H   LORazepam  0.5 mg Intravenous Once    acetaminophen **OR** acetaminophen, prochlorperazine

## 2023-07-13 NOTE — ED Notes (Signed)
Family updated as to patient's status. Father at bedside 

## 2023-07-13 NOTE — ED Notes (Signed)
O2 adjusted to 4 L, PT O2 maintained at 99%

## 2023-07-13 NOTE — ED Triage Notes (Signed)
PT BIB [facility] while transferring from [facility] to [facility]M Dialysis at [facility]. PT became unconscious en route, and required BVM for breathing. Upon arrival, became more responsive, and is Aox4 following 0.4mg  of Narcan.

## 2023-07-13 NOTE — ED Provider Notes (Signed)
Care of patient received from prior provider at 12:58 PM, please see their note for complete H/P and care plan.  Received handoff per ED course.  Clinical Course as of 07/13/23 1258  Fri Jul 13, 2023  0811 Glucose-Capillary(!): 216 [CA]  8295 Reassessed patient at bedside.  Unable to obtain IV access.  IV team consulted.  Morphine and Ativan switched to IM. [CA]  1041 Reassessed patient at bedside.  Patient still admits to significant pain.  Dilaudid given. [CA]    Clinical Course User Index [CA] Mannie Stabile, PA-C    Reassessment: Patient was being admitted to the inpatient service here.  She was being transported via transport and approximately 1 minute prior to arrival patient cannot suddenly unresponsive.  On arrival she had pinpoint pupils 1 mm very poor respiratory effort.  On nonrebreather satting 90%.  0.4 mg Narcan IV administered and patient immediately was awake with normal respirations talking. Appears that she is here with chronic abdominal pain, ESRD needing dialysis and was being transferred for dialysis.  Reassessment: After administration of Narcan she has improved dramatically. Plan for patient to be observed in emergency room, if continuing to improve she will be arranged for admission to medicine.  CRITICAL CARE Performed by: Glyn Ade  ?  Total critical care time: 45 minutes for narcotic overdose  Critical care time was exclusive of separately billable procedures and treating other patients.  Critical care was necessary to treat or prevent imminent or life-threatening deterioration.  Critical care was time spent personally by me on the following activities: development of treatment plan with patient and/or surrogate as well as nursing, discussions with consultants, evaluation of patient's response to treatment, examination of patient, obtaining history from patient or surrogate, ordering and performing treatments and interventions, ordering and  review of laboratory studies, ordering and review of radiographic studies, pulse oximetry and re-evaluation of patient's condition.      Glyn Ade, MD 07/13/23 1504

## 2023-07-13 NOTE — ED Provider Notes (Signed)
[facility] EMERGENCY DEPARTMENT AT [facility] Provider Note   CSN: [id number] Arrival date & time: 07/13/23  0710     History  Chief Complaint  Patient presents with   Weakness    Kathy Lewis is a 27 y.o. female with a past medical history significant for ESRD on TTS HD (last dialysis session yesterday), hypertension, diabetes, VTE on Eliquis, and chronic abdominal pain who presents to the ED due to abdominal pain.  Notes it feels similar to her previous chronic abdominal pain.  Admits to nausea and vomiting.  Patient seen 2 days ago in the ED for the same complaint with reassuring workup.  Patient has had numerous CT scans with most recent on 10/8 which showed signs concerning for colitis and marked cardiomegaly with small pericardial effusion.  Patient denies diarrhea.  No chest pain or shortness of breath.  She notes she last took her oxycodone at 5 PM last night for her abdominal pain.   History obtained from patient and past medical records. No interpreter used during encounter.       Home Medications Prior to Admission medications   Medication Sig Start Date End Date Taking? Authorizing Provider  acetaminophen (TYLENOL) 500 MG tablet Take 1,000 mg by mouth daily as needed for headache (pain).    [provider]  amitriptyline (ELAVIL) 50 MG tablet Take 1 tablet (50 mg total) by mouth at bedtime. 08/30/22   Shanna Cisco, NP  atorvastatin (LIPITOR) 40 MG tablet Take 1 tablet (40 mg total) by mouth every evening. 05/07/23   Bensimhon, Bevelyn Buckles, MD  B Complex-C-Folic Acid (RENA-VITE RX) 1 MG TABS Take 1 tablet by mouth daily. 01/01/23   [provider]  butalbital-acetaminophen-caffeine (FIORICET) 50-325-40 MG tablet Take 1 tablet by mouth every 6 (six) hours as needed. 11/15/21   [provider]  calcium acetate (PHOSLO) 667 MG capsule Take 1,334 mg by mouth 3 (three) times daily with meals. 09/28/22   [provider]  carvedilol  (COREG) 25 MG tablet Take 1 tablet (25 mg total) by mouth 2 (two) times daily with a meal. 05/07/23   Bensimhon, Bevelyn Buckles, MD  clonazePAM (KLONOPIN) 0.5 MG tablet Take 1 tablet (0.5 mg total) by mouth 2 (two) times daily as needed for anxiety. Patient taking differently: Take 0.5 mg by mouth at bedtime. 03/16/22   Leroy Sea, MD  diazepam (VALIUM) 2 MG tablet Take 1 tablet (2 mg total) by mouth every 6 (six) hours as needed (vomiting). 05/28/23   Arthor Captain, PA-C  dicyclomine (BENTYL) 20 MG tablet Take 1 tablet (20 mg total) by mouth 2 (two) times daily as needed for spasms (Abdominal cramps). 06/09/23   Zannie Cove, MD  dicyclomine (BENTYL) 20 MG tablet Take 1 tablet (20 mg total) by mouth 2 (two) times daily. 07/11/23   Elayne Snare K, DO  ELIQUIS 2.5 MG TABS tablet Take 2.5 mg by mouth 2 (two) times daily.    [provider]  famotidine (PEPCID) 20 MG tablet Take 1 tablet (20 mg total) by mouth 2 (two) times daily. 07/11/23   Elayne Snare K, DO  fentaNYL (DURAGESIC) 25 MCG/HR Place 1 patch onto the skin every 3 (three) days. 06/11/23   Zannie Cove, MD  hydrALAZINE (APRESOLINE) 100 MG tablet Take 1 tablet (100 mg total) by mouth 2 (two) times daily. 05/07/23   Bensimhon, Bevelyn Buckles, MD  hydrOXYzine (ATARAX) 10 MG tablet Take 10 mg by mouth daily. Also taken before dialysis on  Tues, Thurs, Sat.    [provider]  insulin lispro (HUMALOG) 100 UNIT/ML injection Inject 0.5 Units into the skin See admin instructions. 0.5 units per hour via insulin pump. 01/08/22   [provider]  isosorbide mononitrate (IMDUR) 30 MG 24 hr tablet Take 1 tablet (30 mg total) by mouth daily. 10/09/22 11/20/23  Arrien, York Ram, MD  magnesium oxide (MAG-OX) 400 MG tablet Take 2 tablets (800 mg total) by mouth daily. 05/07/23   Bensimhon, Bevelyn Buckles, MD  methocarbamol (ROBAXIN) 500 MG tablet Take 500 mg by mouth as needed for muscle spasms.    [provider]   omeprazole (PRILOSEC) 20 MG capsule Take 20 mg by mouth daily. 04/11/23   [provider]  oxyCODONE (ROXICODONE) 5 MG immediate release tablet Take 1 tablet (5 mg total) by mouth every 6 (six) hours as needed for severe pain (pain score 7-10). 07/11/23   Elayne Snare K, DO  sacubitril-valsartan (ENTRESTO) 24-26 MG Take 1 tablet by mouth 2 (two) times daily.    [provider]  senna-docusate (SENOKOT-S) 8.6-50 MG tablet Take 1 tablet by mouth daily. 07/11/23   Elayne Snare K, DO  SUMAtriptan (IMITREX) 50 MG tablet Take 50 mg by mouth daily as needed for headache.    [provider]  valACYclovir (VALTREX) 500 MG tablet Take 500 mg by mouth daily as needed (cold sores). 04/17/22   [provider]  Vitamin D, Ergocalciferol, (DRISDOL) 1.25 MG (50000 UNIT) CAPS capsule Take 50,000 Units by mouth every Sunday.    [provider]  escitalopram (LEXAPRO) 10 MG tablet Take 20 mg by mouth daily.  05/10/20 10/06/20  [provider]  furosemide (LASIX) 40 MG tablet Take 1 tablet (40 mg total) by mouth daily. Patient not taking: Reported on 02/16/2021 10/28/19 02/17/21  Geryl Rankins, MD  lisinopril (ZESTRIL) 10 MG tablet Take 10 mg by mouth daily.  02/23/20 10/06/20  [provider]  spironolactone (ALDACTONE) 25 MG tablet Take 25 mg by mouth daily.  04/20/20 10/06/20  [provider]      Allergies    Cantaloupe extract allergy skin test, Citrullus vulgaris, Food, Strawberry extract, and Nsaids    Review of Systems   Review of Systems  Constitutional:  Negative for fever.  Respiratory:  Negative for shortness of breath.   Cardiovascular:  Negative for chest pain.  Gastrointestinal:  Positive for abdominal pain, nausea and vomiting. Negative for diarrhea.    Physical Exam Updated Vital Signs BP (!) 183/123   Pulse 96   Temp 98.1 F (36.7 C) (Oral)   Resp 20   SpO2 99%  Physical Exam Vitals and nursing note reviewed.   Constitutional:      General: She is not in acute distress.    Appearance: She is ill-appearing.     Comments: Appears uncomfortable in bed.  HENT:     Head: Normocephalic.  Eyes:     Pupils: Pupils are equal, round, and reactive to light.  Cardiovascular:     Rate and Rhythm: Normal rate and regular rhythm.     Pulses: Normal pulses.     Heart sounds: Normal heart sounds. No murmur heard.    No friction rub. No gallop.  Pulmonary:     Effort: Pulmonary effort is normal.     Breath sounds: Normal breath sounds.  Abdominal:     General: Abdomen is flat. There is no distension.     Palpations: Abdomen is soft.  Tenderness: There is abdominal tenderness. There is no guarding or rebound.     Comments: Diffuse tenderness.  Musculoskeletal:        General: Normal range of motion.     Cervical back: Neck supple.  Skin:    General: Skin is warm and dry.  Neurological:     General: No focal deficit present.     Mental Status: She is alert.  Psychiatric:        Mood and Affect: Mood normal.        Behavior: Behavior normal.     ED Results / Procedures / Treatments   Labs (all labs ordered are listed, but only abnormal results are displayed) Labs Reviewed  CBC WITH DIFFERENTIAL/PLATELET - Abnormal; Notable for the following components:      Result Value   RBC 3.64 (*)    MCV 104.9 (*)    RDW 15.9 (*)    Lymphs Abs 0.5 (*)    All other components within normal limits  COMPREHENSIVE METABOLIC PANEL - Abnormal; Notable for the following components:   Potassium 6.2 (*)    Chloride 93 (*)    CO2 21 (*)    Glucose, Bld 231 (*)    BUN 41 (*)    Creatinine, Ser 5.85 (*)    AST 59 (*)    ALT 53 (*)    Total Bilirubin 1.6 (*)    GFR, Estimated 10 (*)    Anion gap >20 (*)    All other components within normal limits  URINALYSIS, ROUTINE W REFLEX MICROSCOPIC - Abnormal; Notable for the following components:   Glucose, UA >=500 (*)    Ketones, ur 5 (*)    Protein, ur >=300  (*)    Bacteria, UA RARE (*)    All other components within normal limits  BLOOD GAS, VENOUS - Abnormal; Notable for the following components:   pCO2, Ven 42 (*)    pO2, Ven 57 (*)    All other components within normal limits  CBG MONITORING, ED - Abnormal; Notable for the following components:   Glucose-Capillary 216 (*)    All other components within normal limits  LIPASE, BLOOD  HCG, SERUM, QUALITATIVE    EKG EKG Interpretation Date/Time:  Friday July 13 2023 07:25:39 EST Ventricular Rate:  93 PR Interval:  193 QRS Duration:  109 QT Interval:  437 QTC Calculation: 544 R Axis:   -85  Text Interpretation: Sinus rhythm Biatrial enlargement Left axis deviation Borderline ST depression, lateral leads Prolonged QT interval Baseline wander, difficult to interpret.  Will repeat Confirmed by Beckey Downing [phone]) on 07/13/2023 7:39:54 AM  Radiology No results found.  Procedures .Critical Care  Performed by: Mannie Stabile, PA-C Authorized by: Mannie Stabile, PA-C   Critical care provider statement:    Critical care time (minutes):  38   Critical care was necessary to treat or prevent imminent or life-threatening deterioration of the following conditions:  Endocrine crisis and renal failure   Critical care was time spent personally by me on the following activities:  Development of treatment plan with patient or surrogate, discussions with consultants, evaluation of patient's response to treatment, examination of patient, ordering and review of laboratory studies, ordering and review of radiographic studies, ordering and performing treatments and interventions, pulse oximetry, re-evaluation of patient's condition and review of old charts   I assumed direction of critical care for this patient from another provider in my specialty: no     Care discussed with:  admitting provider       Medications Ordered in ED Medications  morphine (PF) 4 MG/ML injection 4 mg (4 mg  Intramuscular Given 07/13/23 0832)  LORazepam (ATIVAN) injection 1 mg (1 mg Intramuscular Given 07/13/23 0833)  sodium bicarbonate injection 100 mEq (100 mEq Intravenous Given 07/13/23 1043)  sodium polystyrene (KAYEXALATE) 15 GM/60ML suspension 30 g (30 g Rectal Given 07/13/23 1057)  HYDROmorphone (DILAUDID) injection 1 mg (1 mg Intravenous Given 07/13/23 1055)  prochlorperazine (COMPAZINE) injection 10 mg (10 mg Intravenous Given 07/13/23 1055)    ED Course/ Medical Decision Making/ A&P Clinical Course as of 07/13/23 1106  Fri Jul 13, 2023  0811 Glucose-Capillary(!): 216 [CA]  1610 Reassessed patient at bedside.  Unable to obtain IV access.  IV team consulted.  Morphine and Ativan switched to IM. [CA]  1041 Reassessed patient at bedside.  Patient still admits to significant pain.  Dilaudid given. [CA]    Clinical Course User Index [CA] Mannie Stabile, PA-C                                 Medical Decision Making Amount and/or Complexity of Data Reviewed External Data Reviewed: radiology and notes. Labs: ordered. Decision-making details documented in ED Course. ECG/medicine tests: ordered and independent interpretation performed. Decision-making details documented in ED Course.  Risk Prescription drug management. Decision regarding hospitalization.   This patient presents to the ED for concern of abdominal pain +N/V, this involves an extensive number of treatment options, and is a complaint that carries with it a high risk of complications and morbidity.  The differential diagnosis includes chronic abdominal pain, pancreatitis, DKA, gastroenteritis, gastroparesis, etc  27 year old female presents to the ED due to acute on chronic abdominal pain associated with nausea and vomiting.  Seen 2 days ago for the same complaint with reassuring workup.  History of ESRD last had full dialysis session yesterday.  No chest pain or shortness of breath.  Upon arrival patient afebrile, not  tachycardic or hypoxic.  Patient appears uncomfortable in bed.  Abdomen soft, nondistended with diffuse tenderness.  Abdominal labs ordered.  Morphine given.  Ativan for nausea and vomiting due to prolonged QTc. Discussed with Dr. Rhae Hammock who agrees with assessment and plan. Patient states this feels like her chronic abdominal pain and declined CT abdomen at this time.   CBC reassuring.  No leukocytosis.  Normal hemoglobin.  CMP significant for elevated creatinine at 5.8 and BUN at 41 consistent with ESRD.  Anion gap greater than 20 with bicarb of 21.  Potassium 6.2. hyperglycemia at 231. VBG added. EKG with prolonged QTc and some peaked T waves. Bicarb given. Lipase normal. Low suspicion for pancreatitis. UA negative for signs of infection.  Does demonstrate proteinuria and ketonuria. Glucosuria.   10:30 AM Discussed with Dr. Arlean Hopping with nephrology who recommends giving rectal Kayexalate and bicarb.  Patient will be dialyzed tomorrow. Will consult hospitalist for admission. Patient will need to be transferred to [facility] for dialysis.  10:40 AM Discussed with Dr. Robb Matar with [facility] who agrees to admit patient.   Discussed with Dr. Rhae Hammock who evaluated patient at bedside and agrees with assessment and plan.  Co morbidities that complicate the patient evaluation  ESRD Cardiac Monitoring: / EKG:  The patient was maintained on a cardiac monitor.  I personally viewed and interpreted the cardiac monitored which showed an underlying rhythm of: NSR, prolonged QTc  Test / Admission - Considered:  CT abdomen; however patient has had numerous scans with similar presentations, so will hold off at this time. Lower suspicion for acute abdomen.         Final Clinical Impression(s) / ED Diagnoses Final diagnoses:  Hyperkalemia  Generalized abdominal pain  Nausea and vomiting, unspecified vomiting type    Rx / DC Orders ED Discharge Orders     None         Jesusita Oka 07/13/23 1243     Durwin Glaze, MD 07/13/23 [phone]

## 2023-07-13 NOTE — Progress Notes (Signed)
   07/13/23 1815  Vitals  Temp 98.6 F (37 C)  Temp Source Oral  BP 107/64  MAP (mmHg) 78  BP Location Left Arm  BP Method Automatic  Patient Position (if appropriate) Lying  Pulse Rate 95  Pulse Rate Source Monitor  ECG Heart Rate 96  Oxygen Therapy  SpO2 (!) 82 %  During Treatment Monitoring  Blood Flow Rate (mL/min) 0 mL/min  Arterial Pressure (mmHg) -1.01 mmHg  Venous Pressure (mmHg) -0.61 mmHg  TMP (mmHg) -51.11 mmHg  Ultrafiltration Rate (mL/min) 889 mL/min  Dialysate Flow Rate (mL/min) 300 ml/min  Duration of HD Treatment -hour(s) 3 hour(s)  Cumulative Fluid Removed (mL) per Treatment  2500.17  Intra-Hemodialysis Comments Tx completed  Dialysis Fluid Bolus Normal Saline  Bolus Amount (mL) 300 mL  Post Treatment  Dialyzer Clearance Clear  Hemodialysis Intake (mL) 0 mL  Liters Processed 72  Fluid Removed (mL) 2500 mL  Tolerated HD Treatment Yes  Post-Hemodialysis Comments tx completed noticed desaturation at end of treatment 2L of O2 given saturation up to 100% no c/o  Note  Patient Observations resting no c/o   Received patient in bed to unit.  Alert and oriented.  Informed consent signed and in chart.   TX duration:3  Patient tolerated well.  Transported back to the room  Alert, without acute distress.  Hand-off given to patient's nurse.   Access used: Rt HD Access issues: none  Total UF removed: 2500 Medication(s) given: none Post HD VS: see above Post HD weight: n/a   [facility]

## 2023-07-13 NOTE — ED Notes (Signed)
ED TO INPATIENT HANDOFF REPORT  ED Nurse Name and Phone #: Beatris Ship RN [phone]  S Name/Age/Gender Kathy Lewis 27 y.o. female Room/Bed: TRABC/TRABC  Code Status   Code Status: Full Code  Home/SNF/Other Home Patient oriented to: self, place, time, and situation Is this baseline? Yes   Triage Complete: Triage complete  Chief Complaint Hyperkalemia [E87.5]  Triage Note Patient brought in by POV by father- patient shaking and not wanting to participate in triage questions. Patient was here two days ago for n/v/ abdominal pain, when RN asked if patient was here for same she said yes. Patient is dialysis patient with access to right chest. Had dialysis yesterday.   PT BIB [facility] while transferring from [facility] to [facility] Dialysis at [facility]. PT became unconscious en route, and required BVM for breathing. Upon arrival, became more responsive, and is Aox4 following 0.4mg  of Narcan.    Allergies Allergies  Allergen Reactions   Cantaloupe Extract Allergy Skin Test Itching    Mouth itching     Citrullus Vulgaris Itching and Other (See Comments)    Makes mouth itch, ALL melons     Food Itching    All melon - mouth itching   Strawberry Extract Itching    Mouth itching   Nsaids Itching and Other (See Comments)    Avoid per nephrology    Level of Care/Admitting Diagnosis ED Disposition     ED Disposition  Admit   Condition  --   Comment  Hospital Area: [facility] [[id number]]  Level of Care: Progressive [102]  Admit to Progressive based on following criteria: NEPHROLOGY stable condition requiring close monitoring for AKI, requiring Hemodialysis or Peritoneal Dialysis either from expected electrolyte imbalance, acidosis, or fluid overload that can be managed by NIPPV or high flow oxygen.  May place patient in observation at [facility] or [facility] if equivalent level of care is available:: No  Covid Evaluation: Asymptomatic - no recent exposure (last 10 days) testing not  required  Diagnosis: Hyperkalemia [130865]  Admitting Physician: Bobette Mo [[id number]]  Attending Physician: Bobette Mo [[id number]]          B Medical/Surgery History Past Medical History:  Diagnosis Date   Anemia    Anxiety    Bipolar 2 disorder (HCC)    Chronic kidney disease    Chronic systolic (congestive) heart failure (HCC)    Depression    DKA (diabetic ketoacidoses)    ESRD on peritoneal dialysis (HCC)    HSV infection    on valtrex   Hypokalemia    Leukocytosis    Migraine    Noncompliance with medication regimen    Preeclampsia    Prolonged QT syndrome    Severe anemia    Type 1 diabetes mellitus (HCC)    Past Surgical History:  Procedure Laterality Date   BIOPSY  04/24/2022   Procedure: BIOPSY;  Surgeon: Jenel Lucks, MD;  Location: [facility] ENDOSCOPY;  Service: Gastroenterology;;   CARDIAC CATHETERIZATION     COLONOSCOPY WITH PROPOFOL N/A 04/24/2022   Procedure: COLONOSCOPY WITH PROPOFOL;  Surgeon: Jenel Lucks, MD;  Location: [facility] ENDOSCOPY;  Service: Gastroenterology;  Laterality: N/A;   DILATION AND EVACUATION N/A 10/22/2019   Procedure: ULTRASOUND GUIDED DILATATION AND EVACUATION;  Surgeon: Geryl Rankins, MD;  Location: [facility] LD ORS;  Service: Gynecology;  Laterality: N/A;   ESOPHAGOGASTRODUODENOSCOPY (EGD) WITH PROPOFOL N/A 04/24/2022   Procedure: ESOPHAGOGASTRODUODENOSCOPY (EGD) WITH PROPOFOL;  Surgeon: Jenel Lucks, MD;  Location: [facility] ENDOSCOPY;  Service: Gastroenterology;  Laterality: N/A;   IR FLUORO GUIDE CV LINE RIGHT  09/14/2022   IR US GUIDE VASC ACCESS RIGHT  09/14/2022   peritoneal dialysis catheter insertion     RENAL BIOPSY       A IV Location/Drains/Wounds Patient Lines/Drains/Airways Status     Active Line/Drains/Airways     Name Placement date Placement time Site Days   Peripheral IV 07/13/23 20 G 1.25" Anterior;Right Forearm 07/13/23  0851  Forearm  less than 1   Peripheral IV 07/13/23 20 G  Anterior;Left External jugular 07/13/23  0920  External jugular  less than 1   Hemodialysis Catheter Right Internal jugular Double lumen Permanent (Tunneled) 09/14/22  1520  Internal jugular  302            Intake/Output Last 24 hours  Intake/Output Summary (Last 24 hours) at 07/13/2023 1414 Last data filed at 07/13/2023 1006 Gross per 24 hour  Intake --  Output 15 ml  Net -15 ml    Labs/Imaging Results for orders placed or performed during the hospital encounter of 07/13/23 (from the past 48 hour(s))  POC CBG, ED     Status: Abnormal   Collection Time: 07/13/23  8:06 AM  Result Value Ref Range   Glucose-Capillary 216 (H) 70 - 99 mg/dL    Comment: Glucose reference range applies only to samples taken after fasting for at least 8 hours.  CBC with Differential     Status: Abnormal   Collection Time: 07/13/23  9:09 AM  Result Value Ref Range   WBC 7.2 4.0 - 10.5 K/uL   RBC 3.64 (L) 3.87 - 5.11 MIL/uL   Hemoglobin 12.0 12.0 - 15.0 g/dL   HCT 40.9 81.1 - 91.4 %   MCV 104.9 (H) 80.0 - 100.0 fL   MCH 33.0 26.0 - 34.0 pg   MCHC 31.4 30.0 - 36.0 g/dL   RDW 78.2 (H) 95.6 - 21.3 %   Platelets 152 150 - 400 K/uL   nRBC 0.0 0.0 - 0.2 %   Neutrophils Relative % 90 %   Neutro Abs 6.5 1.7 - 7.7 K/uL   Lymphocytes Relative 7 %   Lymphs Abs 0.5 (L) 0.7 - 4.0 K/uL   Monocytes Relative 3 %   Monocytes Absolute 0.2 0.1 - 1.0 K/uL   Eosinophils Relative 0 %   Eosinophils Absolute 0.0 0.0 - 0.5 K/uL   Basophils Relative 0 %   Basophils Absolute 0.0 0.0 - 0.1 K/uL   Immature Granulocytes 0 %   Abs Immature Granulocytes 0.02 0.00 - 0.07 K/uL    Comment: Performed at [facility], [address], [place], [place] [address]  Comprehensive metabolic panel     Status: Abnormal   Collection Time: 07/13/23  9:09 AM  Result Value Ref Range   Sodium 135 135 - 145 mmol/L   Potassium 6.2 (H) 3.5 - 5.1 mmol/L   Chloride 93 (L) 98 - 111 mmol/L   CO2 21 (L) 22 - 32 mmol/L    Glucose, Bld 231 (H) 70 - 99 mg/dL    Comment: Glucose reference range applies only to samples taken after fasting for at least 8 hours.   BUN 41 (H) 6 - 20 mg/dL   Creatinine, Ser 8.46 (H) 0.44 - 1.00 mg/dL   Calcium 9.3 8.9 - 96.2 mg/dL   Total Protein 8.0 6.5 - 8.1 g/dL   Albumin 4.1 3.5 - 5.0 g/dL   AST 59 (H) 15 - 41 U/L  ALT 53 (H) 0 - 44 U/L   Alkaline Phosphatase 120 38 - 126 U/L   Total Bilirubin 1.6 (H) <1.2 mg/dL   GFR, Estimated 10 (L) >60 mL/min    Comment: (NOTE) Calculated using the CKD-EPI Creatinine Equation (2021)    Anion gap >20 (H) 5 - 15    Comment: ELECTROLYTES REPEATED TO VERIFY Performed at [facility], [address], [place], [place] [address]   Lipase, blood     Status: None   Collection Time: 07/13/23  9:09 AM  Result Value Ref Range   Lipase 18 11 - 51 U/L    Comment: Performed at [facility], [address] [place], [place] [address]  hCG, serum, qualitative     Status: None   Collection Time: 07/13/23  9:43 AM  Result Value Ref Range   Preg, Serum NEGATIVE NEGATIVE    Comment:        THE SENSITIVITY OF THIS METHODOLOGY IS >10 mIU/mL. Performed at [facility], [address],  [place], [place] [address]   Urinalysis, Routine w reflex microscopic -Urine, Clean Catch     Status: Abnormal   Collection Time: 07/13/23 10:04 AM  Result Value Ref Range   Color, Urine YELLOW YELLOW   APPearance CLEAR CLEAR   Specific Gravity, Urine 1.013 1.005 - 1.030   pH 7.0 5.0 - 8.0   Glucose, UA >=500 (A) NEGATIVE mg/dL   Hgb urine dipstick NEGATIVE NEGATIVE   Bilirubin Urine NEGATIVE NEGATIVE   Ketones, ur 5 (A) NEGATIVE mg/dL   Protein, ur >=914 (A) NEGATIVE mg/dL   Nitrite NEGATIVE NEGATIVE   Leukocytes,Ua NEGATIVE NEGATIVE   RBC / HPF 0-5 0 - 5 RBC/hpf   WBC, UA 6-10 0 - 5 WBC/hpf   Bacteria, UA RARE (A) NONE SEEN   Squamous Epithelial / HPF 0-5 0 - 5 /HPF   Hyaline Casts, UA PRESENT      Comment: Performed at [facility], [address], [place], [place] [address]  Blood gas, venous (at [facility] and [facility])     Status: Abnormal   Collection Time: 07/13/23 10:50 AM  Result Value Ref Range   pH, Ven 7.41 7.25 - 7.43   pCO2, Ven 42 (L) 44 - 60 mmHg   pO2, Ven 57 (H) 32 - 45 mmHg   Bicarbonate 26.6 20.0 - 28.0 mmol/L   Acid-Base Excess 1.7 0.0 - 2.0 mmol/L   O2 Saturation 85.4 %   Patient temperature 37.0     Comment: Performed at [facility], [address] [place], [place] [address]  CBG monitoring, ED     Status: Abnormal   Collection Time: 07/13/23 12:59 PM  Result Value Ref Range   Glucose-Capillary 262 (H) 70 - 99 mg/dL    Comment: Glucose reference range applies only to samples taken after fasting for at least 8 hours.   No results found.  Pending Labs Unresulted Labs (From admission, onward)     Start     Ordered   07/14/23 0500  Comprehensive metabolic panel  Tomorrow morning,   R        07/13/23 1152   07/14/23 0500  CBC  Tomorrow morning,   R        07/13/23 1152   07/13/23 1358  Phosphorus  Add-on,   AD        07/13/23 1357   07/13/23 1236  Hepatitis B surface antigen  (New Admission Hemo Labs (  Hepatitis B))  Once,   URGENT        07/13/23 1237   07/13/23 1236  Hepatitis B surface antibody,quantitative  (New Admission Hemo Labs (Hepatitis B))  Once,   URGENT        07/13/23 1237            Vitals/Pain Today's Vitals   07/13/23 1115 07/13/23 1145 07/13/23 1250 07/13/23 1317  BP: 118/65  (!) 151/11   Pulse: 75  88   Resp: 11  13   Temp:   97.7 F (36.5 C)   TempSrc:   Oral   SpO2: 99%  96%   PainSc:  4   0-No pain    Isolation Precautions No active isolations  Medications Medications  prochlorperazine (COMPAZINE) injection 5 mg (has no administration in time range)  heparin injection 5,000 Units (has no administration in time range)  acetaminophen (TYLENOL) tablet 650 mg (has no administration in time  range)    Or  acetaminophen (TYLENOL) suppository 650 mg (has no administration in time range)  Chlorhexidine Gluconate Cloth 2 % PADS 6 each (has no administration in time range)  naloxone [facility]) injection 0.4 mg (has no administration in time range)  morphine (PF) 4 MG/ML injection 4 mg (4 mg Intramuscular Given 07/13/23 0832)  LORazepam (ATIVAN) injection 1 mg (1 mg Intramuscular Given 07/13/23 0833)  sodium bicarbonate injection 100 mEq (100 mEq Intravenous Given 07/13/23 1043)  sodium polystyrene (KAYEXALATE) 15 GM/60ML suspension 30 g (30 g Rectal Given 07/13/23 1057)  HYDROmorphone (DILAUDID) injection 1 mg (1 mg Intravenous Given 07/13/23 1055)  prochlorperazine (COMPAZINE) injection 10 mg (10 mg Intravenous Given 07/13/23 1055)  diphenhydrAMINE (BENADRYL) injection 12.5 mg (12.5 mg Intravenous Given 07/13/23 1209)  naloxone (NARCAN) injection 0.4 mg (0.4 mg Intravenous Given 07/13/23 1256)        R Recommendations: See Admitting Provider Note  Report given to: 1O10

## 2023-07-13 NOTE — H&P (Signed)
History and Physical    Patient: Kathy Lewis ZOX:[id number] DOB: 1995-10-02 DOA: 07/13/2023 DOS: the patient was seen and examined on 07/13/2023 PCP: Loura Pardon, PA  Patient coming from: Home  Chief Complaint:  Chief Complaint  Patient presents with   Weakness   HPI: Kathy Lewis is a 27 y.o. female with medical history significant of ESRD anemia, ESRD on hemodialysis anxiety, depression, history of back spasm, type 1 diabetes history of DKA, diabetic gastroparesis, HSV infection, history of hypokalemia, leukocytosis, noncompliance with medication regimen, preeclampsia, chronic systolic heart failure, prolonged QT syndrome, chronic abdominal pain who presented to the emergency department via private vehicle with her father due to abdominal pain, nausea and emesis for the past 2 days.  She states she took all her medications this morning, but had emesis about 30 minutes later.  She denied fever, chills, rhinorrhea, sore throat, wheezing or hemoptysis.  No chest pain, palpitations, diaphoresis, PND, orthopnea or pitting edema of the lower extremities.  No diarrhea, or stools have been soft.  No constipation, melena or hematochezia.  No flank pain, dysuria, frequency or hematuria.  No polyuria, polydipsia, polyphagia or blurred vision.   Lab work: Her urine analysis showed greater than 500 glucose, ketones of 5 and protein of greater than 300 mg/dL.  There was rare bacteria microscopic examination.  CBC showed white count 7.2, hemoglobin 12.0 g deciliter with an MCV of 104.9 fL and platelets 152.  Lipase was normal.  CMP showed a 735, potassium 6.2, chloride 93 and CO2 21 mmol/L.  BUN was 41, creatinine 5.85, glucose 231 and total bilirubin 1.6 mg/dL.  AST was 59 and ALT 53 units/L.  The rest of the LFTs were normal.   ED course: Initial vital signs were temperature 98.1 F, pulse 93, respiration 19, BP 185/132 mmHg O2 sat 100%.   The case was discussed with nephrology who recommended  admission at [facility] given lack of HD service at [facility].The patient received lorazepam 1 mg IM, hydromorphone 1 mg IVP, morphine 4 mg IM and 100 mEq of sodium bicarbonate IVP and 30 g of Kayexalate p.o..  I added Benadryl 12.5 mg IVP x 1 after the patient complained of itching secondary to contact with type and requested it.  While in route to [facility], the patient became unresponsive and was taken to the [facility] ED.  She responded to naloxone 0.4 mg IVP.  Her bed was changed to PCU.  Review of Systems: As mentioned in the history of present illness. All other systems reviewed and are negative. Past Medical History:  Diagnosis Date   Anemia    Anxiety    Bipolar 2 disorder (HCC)    Chronic kidney disease    Chronic systolic (congestive) heart failure (HCC)    Depression    DKA (diabetic ketoacidoses)    ESRD on peritoneal dialysis (HCC)    HSV infection    on valtrex   Hypokalemia    Leukocytosis    Migraine    Noncompliance with medication regimen    Preeclampsia    Prolonged QT syndrome    Severe anemia    Type 1 diabetes mellitus (HCC)    Past Surgical History:  Procedure Laterality Date   BIOPSY  04/24/2022   Procedure: BIOPSY;  Surgeon: Jenel Lucks, MD;  Location: [facility] ENDOSCOPY;  Service: Gastroenterology;;   CARDIAC CATHETERIZATION     COLONOSCOPY WITH PROPOFOL N/A 04/24/2022   Procedure: COLONOSCOPY WITH PROPOFOL;  Surgeon: Jenel Lucks,  MD;  Location: [facility] ENDOSCOPY;  Service: Gastroenterology;  Laterality: N/A;   DILATION AND EVACUATION N/A 10/22/2019   Procedure: ULTRASOUND GUIDED DILATATION AND EVACUATION;  Surgeon: Geryl Rankins, MD;  Location: [facility] LD ORS;  Service: Gynecology;  Laterality: N/A;   ESOPHAGOGASTRODUODENOSCOPY (EGD) WITH PROPOFOL N/A 04/24/2022   Procedure: ESOPHAGOGASTRODUODENOSCOPY (EGD) WITH PROPOFOL;  Surgeon: Jenel Lucks, MD;  Location: [facility] ENDOSCOPY;  Service: Gastroenterology;  Laterality: N/A;   IR FLUORO  GUIDE CV LINE RIGHT  09/14/2022   IR US GUIDE VASC ACCESS RIGHT  09/14/2022   peritoneal dialysis catheter insertion     RENAL BIOPSY     Social History:  reports that she has quit smoking. Her smoking use included cigars and cigarettes. She has a 2 pack-year smoking history. She has never used smokeless tobacco. She reports that she does not drink alcohol and does not use drugs.  Allergies  Allergen Reactions   Cantaloupe Extract Allergy Skin Test Itching    Mouth itching     Citrullus Vulgaris Itching and Other (See Comments)    Makes mouth itch, ALL melons     Food Itching    All melon - mouth itching   Strawberry Extract Itching    Mouth itching   Nsaids Itching and Other (See Comments)    Avoid per nephrology    Family History  Adopted: Yes  Problem Relation Age of Onset   Heart disease Neg Hx     Prior to Admission medications   Medication Sig Start Date End Date Taking? Authorizing Provider  acetaminophen (TYLENOL) 500 MG tablet Take 1,000 mg by mouth daily as needed for headache (pain).    [provider]  amitriptyline (ELAVIL) 50 MG tablet Take 1 tablet (50 mg total) by mouth at bedtime. 08/30/22   Shanna Cisco, NP  atorvastatin (LIPITOR) 40 MG tablet Take 1 tablet (40 mg total) by mouth every evening. 05/07/23   Bensimhon, Bevelyn Buckles, MD  B Complex-C-Folic Acid (RENA-VITE RX) 1 MG TABS Take 1 tablet by mouth daily. 01/01/23   [provider]  butalbital-acetaminophen-caffeine (FIORICET) 50-325-40 MG tablet Take 1 tablet by mouth every 6 (six) hours as needed. 11/15/21   [provider]  calcium acetate (PHOSLO) 667 MG capsule Take 1,334 mg by mouth 3 (three) times daily with meals. 09/28/22   [provider]  carvedilol (COREG) 25 MG tablet Take 1 tablet (25 mg total) by mouth 2 (two) times daily with a meal. 05/07/23   Bensimhon, Bevelyn Buckles, MD  clonazePAM (KLONOPIN) 0.5 MG tablet Take 1 tablet (0.5 mg total) by mouth 2 (two) times daily  as needed for anxiety. Patient taking differently: Take 0.5 mg by mouth at bedtime. 03/16/22   Leroy Sea, MD  diazepam (VALIUM) 2 MG tablet Take 1 tablet (2 mg total) by mouth every 6 (six) hours as needed (vomiting). 05/28/23   Arthor Captain, PA-C  dicyclomine (BENTYL) 20 MG tablet Take 1 tablet (20 mg total) by mouth 2 (two) times daily as needed for spasms (Abdominal cramps). 06/09/23   Zannie Cove, MD  dicyclomine (BENTYL) 20 MG tablet Take 1 tablet (20 mg total) by mouth 2 (two) times daily. 07/11/23   Elayne Snare K, DO  ELIQUIS 2.5 MG TABS tablet Take 2.5 mg by mouth 2 (two) times daily.    [provider]  famotidine (PEPCID) 20 MG tablet Take 1 tablet (20 mg total) by mouth 2 (two) times daily. 07/11/23   Rexford Maus, DO  fentaNYL (DURAGESIC) 25 MCG/HR Place 1 patch onto the skin every 3 (three) days. 06/11/23   Zannie Cove, MD  hydrALAZINE (APRESOLINE) 100 MG tablet Take 1 tablet (100 mg total) by mouth 2 (two) times daily. 05/07/23   Bensimhon, Bevelyn Buckles, MD  hydrOXYzine (ATARAX) 10 MG tablet Take 10 mg by mouth daily. Also taken before dialysis on Tues, Thurs, Sat.    [provider]  insulin lispro (HUMALOG) 100 UNIT/ML injection Inject 0.5 Units into the skin See admin instructions. 0.5 units per hour via insulin pump. 01/08/22   [provider]  isosorbide mononitrate (IMDUR) 30 MG 24 hr tablet Take 1 tablet (30 mg total) by mouth daily. 10/09/22 11/20/23  Arrien, York Ram, MD  magnesium oxide (MAG-OX) 400 MG tablet Take 2 tablets (800 mg total) by mouth daily. 05/07/23   Bensimhon, Bevelyn Buckles, MD  methocarbamol (ROBAXIN) 500 MG tablet Take 500 mg by mouth as needed for muscle spasms.    [provider]  omeprazole (PRILOSEC) 20 MG capsule Take 20 mg by mouth daily. 04/11/23   [provider]  oxyCODONE (ROXICODONE) 5 MG immediate release tablet Take 1 tablet (5 mg total) by mouth every 6 (six) hours as needed for  severe pain (pain score 7-10). 07/11/23   Elayne Snare K, DO  sacubitril-valsartan (ENTRESTO) 24-26 MG Take 1 tablet by mouth 2 (two) times daily.    [provider]  senna-docusate (SENOKOT-S) 8.6-50 MG tablet Take 1 tablet by mouth daily. 07/11/23   Elayne Snare K, DO  SUMAtriptan (IMITREX) 50 MG tablet Take 50 mg by mouth daily as needed for headache.    [provider]  valACYclovir (VALTREX) 500 MG tablet Take 500 mg by mouth daily as needed (cold sores). 04/17/22   [provider]  Vitamin D, Ergocalciferol, (DRISDOL) 1.25 MG (50000 UNIT) CAPS capsule Take 50,000 Units by mouth every Sunday.    [provider]  escitalopram (LEXAPRO) 10 MG tablet Take 20 mg by mouth daily.  05/10/20 10/06/20  [provider]  furosemide (LASIX) 40 MG tablet Take 1 tablet (40 mg total) by mouth daily. Patient not taking: Reported on 02/16/2021 10/28/19 02/17/21  Geryl Rankins, MD  lisinopril (ZESTRIL) 10 MG tablet Take 10 mg by mouth daily.  02/23/20 10/06/20  [provider]  spironolactone (ALDACTONE) 25 MG tablet Take 25 mg by mouth daily.  04/20/20 10/06/20  [provider]    Physical Exam: Vitals:   07/13/23 0724 07/13/23 0727 07/13/23 0845 07/13/23 0945  BP:  (!) 185/132 (!) 176/126 (!) 183/123  Pulse: 93 93 96   Resp: 19 14 (!) 24 20  Temp:  98.1 F (36.7 C)    TempSrc:  Oral    SpO2: 100% 100% 94% 99%   Physical Exam Vitals and nursing note reviewed.  Constitutional:      General: She is awake. She is not in acute distress.    Appearance: Normal appearance. She is ill-appearing.  HENT:     Head: Normocephalic.     Nose: No rhinorrhea.     Mouth/Throat:     Mouth: Mucous membranes are moist.  Eyes:     General: No scleral icterus.    Pupils: Pupils are equal, round, and reactive to light.  Neck:     Vascular: No JVD.  Cardiovascular:     Rate and Rhythm: Normal rate and regular rhythm.     Heart sounds: S1 normal and  S2 normal.  Pulmonary:  Effort: Pulmonary effort is normal.     Breath sounds: Normal breath sounds.  Abdominal:     General: Bowel sounds are normal. There is no distension.     Palpations: Abdomen is soft.     Tenderness: There is no abdominal tenderness.  Musculoskeletal:     Cervical back: Neck supple.     Right lower leg: No edema.     Left lower leg: No edema.  Skin:    General: Skin is warm and dry.  Neurological:     General: No focal deficit present.     Mental Status: She is alert and oriented to person, place, and time.  Psychiatric:        Mood and Affect: Mood normal.        Behavior: Behavior normal. Behavior is cooperative.    Data Reviewed:  Results are pending, will review when available. 05/07/2023 TTE IMPRESSIONS:   1. Left ventricular ejection fraction, by estimation, is 20 to 25%. Left  ventricular ejection fraction by 2D MOD biplane is 21.0 %. The left  ventricle has severely decreased function. The left ventricle demonstrates  global hypokinesis. The left  ventricular internal cavity size was mildly dilated. There is moderate  asymmetric left ventricular hypertrophy of the infero-lateral segment.  Left ventricular diastolic parameters are consistent with Grade III  diastolic dysfunction (restrictive).  Elevated left ventricular end-diastolic pressure.   2. Right ventricular systolic function is moderately reduced. The right  ventricular size is mildly enlarged. Mildly increased right ventricular  wall thickness. There is moderately elevated pulmonary artery systolic  pressure. The estimated right  ventricular systolic pressure is 49.8 mmHg.   3. Left atrial size was mildly dilated.   4. Right atrial size was moderately dilated.   5. The mitral valve is abnormal. Moderate mitral valve regurgitation. No  evidence of mitral stenosis.   6. The tricuspid valve is abnormal. Tricuspid valve regurgitation is  moderate to severe.   7. The aortic valve  is tricuspid. Aortic valve regurgitation is not  visualized. No aortic stenosis is present.   8. Moderately dilated pulmonary artery.   9. The inferior vena cava is dilated in size with <50% respiratory  variability, suggesting right atrial pressure of 15 mmHg.   EKG: Vent. rate 93 BPM PR interval 193 ms QRS duration 109 ms QT/QTcB 437/544 ms P-R-T axes 80 -85 68 Sinus rhythm Biatrial enlargement Left axis deviation Borderline ST depression, lateral leads Prolonged QT interval  Assessment and Plan: Principal Problem:   Hyperkalemia In the setting of:   ESRD on dialysis [facility]) Observation/PCU. Received Kayexalate earlier. Follow potassium level. Scheduled for hemodialysis tomorrow. Nephrology consult appreciated.  Active Problems:   Narcotic induced mental alteration Resolved with Narcan. Will continue close monitoring in PCU.    Abdominal pain with vomiting In the setting of history of:   Gastroparesis Hold analgesics for now.   Antiemetics as needed.    Essential hypertension Continue carvedilol 25 mg p.o. twice daily.    Uncontrolled type 1 diabetes mellitus with hyperglycemia,  with long-term current use of insulin (HCC) Carbohydrate modified diet. CBG monitoring before meals and bedtime. The patient has an insulin pump.    Prolonged QT interval Avoid QT prolonging meds as possible. Continue carvedilol. Keep electrolytes optimized. Check EKG in the morning.    Chronic HFrEF (heart failure with reduced ejection fraction) (HCC) Continue carvedilol as above. Continue Entresto 24/26 mg p.o. twice daily. Continue Imdur 30 mg p.o. daily.    Dyslipidemia Continue  atorvastatin 40 mg p.o. daily.     Advance Care Planning:   Code Status: Full Code   Consults: Nephrology Delano Metz, MD).  Family Communication:   Severity of Illness: The appropriate patient status for this patient is OBSERVATION. Observation status is judged to be reasonable and  necessary in order to provide the required intensity of service to ensure the patient's safety. The patient's presenting symptoms, physical exam findings, and initial radiographic and laboratory data in the context of their medical condition is felt to place them at decreased risk for further clinical deterioration. Furthermore, it is anticipated that the patient will be medically stable for discharge from the hospital within 2 midnights of admission.   Author: Bobette Mo, MD 07/13/2023 10:41 AM  For on call review [email or web address].

## 2023-07-13 NOTE — Inpatient Diabetes Management (Addendum)
Inpatient Diabetes Program Recommendations  AACE/ADA: New Consensus Statement on Inpatient Glycemic Control (2015)  Target Ranges:  Prepandial:   less than 140 mg/dL      Peak postprandial:   less than 180 mg/dL (1-2 hours)      Critically ill patients:  140 - 180 mg/dL   Lab Results  Component Value Date   GLUCAP 262 (H) 07/13/2023   HGBA1C 9.0 (H) 06/06/2023    Review of Glycemic Control  Diabetes history: type 1 Outpatient Diabetes medications: OmniPod insulin pump with Dexcom CGM; insulin pump settings (0.5 units/hour (total basal 12 units per day), 1 unit per 8 grams carbs, 1 unit drops glucose 50 mg/dl) per settings on 04/54/09 Current orders for Inpatient glycemic control: none  Inpatient Diabetes Program Recommendations:   [facility] at [facility]. Currently on dialysis. Spoke with patient on the dialysis unit and asked her about her insulin pump. She stated clearly that she did not have it on because she thought she might be needing surgery. Patient very sleepy, but arousable when called by name. Tried to check her abdomen to see if the pump was on her, but did not see it.  Spoke with staff RN and she will double check to make sure it is off. Not sure patient is alert enough to manage an insulin pump while in the hospital.  If insulin pump is off, patient will need SQ insulin to cover her blood sugars. Recommend: Semglee 12 units Q 24 hours, Novolog 0-6 units correction scale every 4 hours while NPO.  Will continue to follow while in the hospital.  Smith Mince RN BSN CDE Diabetes Coordinator Pager: [phone]  8am-5pm

## 2023-07-14 ENCOUNTER — Observation Stay (HOSPITAL_COMMUNITY): Payer: 59

## 2023-07-14 DIAGNOSIS — R9431 Abnormal electrocardiogram [ECG] [EKG]: Secondary | ICD-10-CM

## 2023-07-14 DIAGNOSIS — R109 Unspecified abdominal pain: Secondary | ICD-10-CM

## 2023-07-14 DIAGNOSIS — I5022 Chronic systolic (congestive) heart failure: Secondary | ICD-10-CM

## 2023-07-14 DIAGNOSIS — E875 Hyperkalemia: Secondary | ICD-10-CM | POA: Diagnosis not present

## 2023-07-14 DIAGNOSIS — R1110 Vomiting, unspecified: Secondary | ICD-10-CM

## 2023-07-14 LAB — COMPREHENSIVE METABOLIC PANEL
ALT: 42 U/L (ref 0–44)
AST: 39 U/L (ref 15–41)
Albumin: 3.5 g/dL (ref 3.5–5.0)
Alkaline Phosphatase: 111 U/L (ref 38–126)
Anion gap: 15 (ref 5–15)
BUN: 32 mg/dL — ABNORMAL HIGH (ref 6–20)
CO2: 27 mmol/L (ref 22–32)
Calcium: 8.6 mg/dL — ABNORMAL LOW (ref 8.9–10.3)
Chloride: 91 mmol/L — ABNORMAL LOW (ref 98–111)
Creatinine, Ser: 4.88 mg/dL — ABNORMAL HIGH (ref 0.44–1.00)
GFR, Estimated: 12 mL/min — ABNORMAL LOW (ref >60)
Glucose, Bld: 414 mg/dL — ABNORMAL HIGH (ref 70–99)
Potassium: 4.5 mmol/L (ref 3.5–5.1)
Sodium: 133 mmol/L — ABNORMAL LOW (ref 135–145)
Total Bilirubin: 1.1 mg/dL (ref <1.2)
Total Protein: 6.9 g/dL (ref 6.5–8.1)

## 2023-07-14 LAB — CBC
HCT: 32.7 % — ABNORMAL LOW (ref 36.0–46.0)
Hemoglobin: 10.6 g/dL — ABNORMAL LOW (ref 12.0–15.0)
MCH: 31.8 pg (ref 26.0–34.0)
MCHC: 32.4 g/dL (ref 30.0–36.0)
MCV: 98.2 fL (ref 80.0–100.0)
Platelets: 175 10*3/uL (ref 150–400)
RBC: 3.33 MIL/uL — ABNORMAL LOW (ref 3.87–5.11)
RDW: 15.9 % — ABNORMAL HIGH (ref 11.5–15.5)
WBC: 7.4 10*3/uL (ref 4.0–10.5)
nRBC: 0 % (ref 0.0–0.2)

## 2023-07-14 LAB — GLUCOSE, CAPILLARY
Glucose-Capillary: 57 mg/dL — ABNORMAL LOW (ref 70–99)
Glucose-Capillary: 180 mg/dL — ABNORMAL HIGH (ref 70–99)

## 2023-07-14 MED ORDER — HEPARIN SODIUM (PORCINE) 1000 UNIT/ML DIALYSIS
1000.0000 [IU] | INTRAMUSCULAR | Status: DC | PRN
Start: 2023-07-14 — End: 2023-07-14

## 2023-07-14 MED ORDER — ANTICOAGULANT SODIUM CITRATE 4% (200MG/5ML) IV SOLN
5.0000 mL | Status: DC | PRN
Start: 2023-07-14 — End: 2023-07-14

## 2023-07-14 MED ORDER — LIDOCAINE-PRILOCAINE 2.5-2.5 % EX CREA
1.0000 | TOPICAL_CREAM | CUTANEOUS | Status: DC | PRN
Start: 2023-07-14 — End: 2023-07-14

## 2023-07-14 MED ORDER — ALTEPLASE 2 MG IJ SOLR: 2.0000 mg | Freq: Once | INTRAMUSCULAR | Status: DC | PRN

## 2023-07-14 MED ORDER — CHLORHEXIDINE GLUCONATE CLOTH 2 % EX PADS
6.0000 | MEDICATED_PAD | Freq: Every day | CUTANEOUS | Status: DC
  Administered 2023-07-14: 6 via TOPICAL

## 2023-07-14 MED ORDER — LIDOCAINE HCL (PF) 1 % IJ SOLN: 5.0000 mL | INTRAMUSCULAR | Status: DC | PRN

## 2023-07-14 MED ORDER — PENTAFLUOROPROP-TETRAFLUOROETH EX AERO
1.0000 | INHALATION_SPRAY | CUTANEOUS | Status: DC | PRN
Start: 2023-07-14 — End: 2023-07-14

## 2023-07-14 MED ORDER — HEPARIN SODIUM (PORCINE) 1000 UNIT/ML DIALYSIS
1500.0000 [IU] | INTRAMUSCULAR | Status: AC | PRN
  Administered 2023-07-14: 1500 [IU] via INTRAVENOUS_CENTRAL
  Filled 2023-07-14: qty 2

## 2023-07-14 MED ORDER — NEPRO/CARBSTEADY PO LIQD
237.0000 mL | ORAL | Status: DC | PRN
Start: 2023-07-14 — End: 2023-07-14

## 2023-07-14 MED ORDER — HEPARIN SODIUM (PORCINE) 1000 UNIT/ML DIALYSIS
2000.0000 [IU] | Freq: Once | INTRAMUSCULAR | Status: AC
  Administered 2023-07-14: 2000 [IU] via INTRAVENOUS_CENTRAL
  Filled 2023-07-14: qty 2

## 2023-07-14 MED ORDER — HEPARIN SODIUM (PORCINE) 1000 UNIT/ML IJ SOLN
3200.0000 [IU] | Freq: Once | INTRAMUSCULAR | Status: AC
  Administered 2023-07-14: 3200 [IU]
  Filled 2023-07-14: qty 4

## 2023-07-14 MED ORDER — INSULIN GLARGINE-YFGN 100 UNIT/ML ~~LOC~~ SOLN
8.0000 [IU] | Freq: Every day | SUBCUTANEOUS | Status: DC
  Administered 2023-07-14: 8 [IU] via SUBCUTANEOUS
  Filled 2023-07-14: qty 0.08

## 2023-07-14 MED ORDER — LIDOCAINE HCL (PF) 1 % IJ SOLN
5.0000 mL | INTRAMUSCULAR | Status: DC | PRN
Start: 2023-07-14 — End: 2023-07-14

## 2023-07-14 MED ORDER — PENTAFLUOROPROP-TETRAFLUOROETH EX AERO: 1.0000 | INHALATION_SPRAY | CUTANEOUS | Status: DC | PRN

## 2023-07-14 MED ORDER — LIDOCAINE-PRILOCAINE 2.5-2.5 % EX CREA: 1.0000 | TOPICAL_CREAM | CUTANEOUS | Status: DC | PRN

## 2023-07-14 MED ORDER — HEPARIN SODIUM (PORCINE) 1000 UNIT/ML DIALYSIS: 1000.0000 [IU] | INTRAMUSCULAR | Status: DC | PRN

## 2023-07-14 MED ORDER — INSULIN GLARGINE-YFGN 100 UNIT/ML ~~LOC~~ SOLN: 8.0000 [IU] | Freq: Every day | SUBCUTANEOUS | Status: DC

## 2023-07-14 NOTE — Progress Notes (Signed)
Received patient in bed.Alert to person ,place and time.Consent verified.  Access used " Right HD catheter that worked well.Dressing on date.  Medicine given : Heparin 2,000 units pre-run and 1500 mid run dose.  Duration of treatment : 3.5 hours.  UF goal 2.5 L  Hemo comment:  Hand off to the patient's nurse : Back into her room with stable medical condition via transporter.

## 2023-07-14 NOTE — Procedures (Signed)
I have reviewed the HD regimen and made appropriate changes.  Vinson Moselle MD  [facility] 07/14/2023, 5:22 AM

## 2023-07-14 NOTE — Plan of Care (Signed)
  Problem: Education: Goal: Ability to describe self-care measures that may prevent or decrease complications (Diabetes Survival Skills Education) will improve Outcome: Progressing Goal: Individualized Educational Video(s) Outcome: Progressing   Problem: Coping: Goal: Ability to adjust to condition or change in health will improve Outcome: Progressing   Problem: Fluid Volume: Goal: Ability to maintain a balanced intake and output will improve Outcome: Progressing   Problem: Pain Management: Goal: General experience of comfort will improve Outcome: Progressing   Problem: Safety: Goal: Ability to remain free from injury will improve Outcome: Progressing   Problem: Skin Integrity: Goal: Risk for impaired skin integrity will decrease Outcome: Progressing

## 2023-07-14 NOTE — Discharge Summary (Signed)
PATIENT DETAILS Name: Kathy Lewis Age: 27 y.o. Sex: female Date of Birth: 01-21-1996 MRN: [id number]. Admitting Physician: Dorcas Carrow, MD WUJ:[id number], Marzella Schlein, PA  Admit Date: 07/13/2023 Discharge date: 07/14/2023  Recommendations for Outpatient Follow-up:  Follow up with PCP in 1-2 weeks Please obtain CMP/CBC in one week Please ensure follow-up with nephrology/hemodialysis unit/cardiology   Admitted From:  Home  Disposition: Home   Discharge Condition: good  CODE STATUS:   Code Status: Full Code   Diet recommendation:  Diet Order             Diet - low sodium heart healthy           Diet Carb Modified           Diet renal/carb modified with fluid restriction Diet-HS Snack? Nothing; Fluid restriction: 1200 mL Fluid; Room service appropriate? Yes; Fluid consistency: Thin  Diet effective now                    Brief Summary: 27 year old with history of ESRD on HD, chronic HFrEF, DM-2, gastroparesis, who presented to the ED with an episode of vomiting and abdominal pain-she was found to have hyperkalemia-with plans to undergo HD.  While in the ED-patient received narcotics and became temporarily lethargic-and hence was admitted to the hospitalist service.  Brief Hospital Course: Hyperkalemia Resolved with hemodialysis Potassium levels have normalized  Acute toxic encephalopathy Secondary to narcotics that she received in the ED She received Narcan-with significant improvement in her mental status. This morning she is completely awake and alert.  Vomiting/abdominal pain Has resolved-this occurred yesterday-likely due to gastroparesis Small portion meals were reemphasized Per review of most recent discharge summary on 10/12-has some amount of chronic abdominal pain.  Abdominal exam this morning is benign.  Chronic HFrEF Volume removal with HD Continue Entresto/Imdur/Coreg  History of prolonged QT syndrome Continue to monitor  closely  History of right atrial mobile density Reviewed discharge summary 06/09/2023-this was reviewed by cardiology with recommendations to continue Eliquis.  ESRD on HD TTS Followed by nephrology closely during this hospitalization-underwent HD yesterday-and will undergo another HD today to get back on her usual schedule.  Normocytic anemia Secondary to above Aranesp/iron therapy deferred to nephrology service  DM-1 On Semglee/SSI while inpatient-resume insulin pump on discharge.  History of right IJ DVT Continue Eliquis  History of bipolar disorder Appears stable  Obesity: Estimated body mass index is 23.21 kg/m as calculated from the following:   Height as of this encounter: 5' (1.524 m).   Weight as of this encounter: 53.9 kg.    Discharge Diagnoses:  Principal Problem:   Hyperkalemia Active Problems:   Abdominal pain with vomiting   Essential hypertension   ESRD on dialysis [facility])   Uncontrolled type 1 diabetes mellitus with hyperglycemia, with long-term current use of insulin (HCC)   Prolonged QT interval   Chronic HFrEF (heart failure with reduced ejection fraction) (HCC)   Dyslipidemia   Gastroparesis   Narcotic induced mental alteration   Discharge Instructions:  Activity:  As tolerated    Discharge Instructions     (HEART FAILURE PATIENTS) Call MD:  Anytime you have any of the following symptoms: 1) 3 pound weight gain in 24 hours or 5 pounds in 1 week 2) shortness of breath, with or without a dry hacking cough 3) swelling in the hands, feet or stomach 4) if you have to sleep on extra pillows at night in order to breathe.   Complete by: As directed  Call MD for:  difficulty breathing, headache or visual disturbances   Complete by: As directed    Diet - low sodium heart healthy   Complete by: As directed    Diet Carb Modified   Complete by: As directed    Discharge instructions   Complete by: As directed    Follow with Primary MD   Kathrin Penner Marzella Schlein, PA in 1-2 weeks  Please get a complete blood count and chemistry panel checked by your Primary MD at your next visit, and again as instructed by your Primary MD.  Get Medicines reviewed and adjusted: Please take all your medications with you for your next visit with your Primary MD  Laboratory/radiological data: Please request your Primary MD to go over all hospital tests and procedure/radiological results at the follow up, please ask your Primary MD to get all Hospital records sent to his/her office.  In some cases, they will be blood work, cultures and biopsy results pending at the time of your discharge. Please request that your primary care M.D. follows up on these results.  Also Note the following: If you experience worsening of your admission symptoms, develop shortness of breath, life threatening emergency, suicidal or homicidal thoughts you must seek medical attention immediately by calling 911 or calling your MD immediately  if symptoms less severe.  You must read complete instructions/literature along with all the possible adverse reactions/side effects for all the Medicines you take and that have been prescribed to you. Take any new Medicines after you have completely understood and accpet all the possible adverse reactions/side effects.   Do not drive when taking Pain medications or sleeping medications (Benzodaizepines)  Do not take more than prescribed Pain, Sleep and Anxiety Medications. It is not advisable to combine anxiety,sleep and pain medications without talking with your primary care practitioner  Special Instructions: If you have smoked or chewed Tobacco  in the last 2 yrs please stop smoking, stop any regular Alcohol  and or any Recreational drug use.  Wear Seat belts while driving.  Please note: You were cared for by a hospitalist during your hospital stay. Once you are discharged, your primary care physician will handle any further medical  issues. Please note that NO REFILLS for any discharge medications will be authorized once you are discharged, as it is imperative that you return to your primary care physician (or establish a relationship with a primary care physician if you do not have one) for your post hospital discharge needs so that they can reassess your need for medications and monitor your lab values.   Increase activity slowly   Complete by: As directed    No wound care   Complete by: As directed       Allergies as of 07/14/2023       Reactions   Cantaloupe Extract Allergy Skin Test Itching   Mouth itching   Citrullus Vulgaris Itching, Other (See Comments)   Makes mouth itch, ALL melons     Food Itching   All melon - mouth itching   Strawberry Extract Itching   Mouth itching   Nsaids Itching, Other (See Comments)   Avoid per nephrology        Medication List     STOP taking these medications    diazepam 2 MG tablet Commonly known as: Valium   fentaNYL 25 MCG/HR Commonly known as: DURAGESIC       TAKE these medications    acetaminophen 500 MG tablet Commonly known as: TYLENOL  Take 1,000 mg by mouth daily as needed for headache (pain).   amitriptyline 50 MG tablet Commonly known as: ELAVIL Take 1 tablet (50 mg total) by mouth at bedtime.   atorvastatin 40 MG tablet Commonly known as: LIPITOR Take 1 tablet (40 mg total) by mouth every evening.   butalbital-acetaminophen-caffeine 50-325-40 MG tablet Commonly known as: FIORICET Take 1 tablet by mouth every 6 (six) hours as needed for migraine.   calcium acetate 667 MG capsule Commonly known as: PHOSLO Take 1,334 mg by mouth 3 (three) times daily with meals.   carvedilol 25 MG tablet Commonly known as: COREG Take 1 tablet (25 mg total) by mouth 2 (two) times daily with a meal.   clonazePAM 0.5 MG tablet Commonly known as: KLONOPIN Take 1 tablet (0.5 mg total) by mouth 2 (two) times daily as needed for anxiety. What changed:  when to take this   dicyclomine 20 MG tablet Commonly known as: BENTYL Take 1 tablet (20 mg total) by mouth 2 (two) times daily as needed for spasms (Abdominal cramps).   dicyclomine 20 MG tablet Commonly known as: BENTYL Take 1 tablet (20 mg total) by mouth 2 (two) times daily.   Eliquis 2.5 MG Tabs tablet Generic drug: apixaban Take 2.5 mg by mouth 2 (two) times daily.   famotidine 20 MG tablet Commonly known as: PEPCID Take 1 tablet (20 mg total) by mouth 2 (two) times daily.   hydrALAZINE 100 MG tablet Commonly known as: APRESOLINE Take 1 tablet (100 mg total) by mouth 2 (two) times daily.   hydrOXYzine 10 MG tablet Commonly known as: ATARAX Take 10 mg by mouth daily. Also taken before dialysis on Tues, Thurs, Sat.   insulin lispro 100 UNIT/ML injection Commonly known as: HUMALOG Inject 0.5 Units into the skin See admin instructions. 0.5 units per hour via insulin pump.   isosorbide mononitrate 30 MG 24 hr tablet Commonly known as: IMDUR Take 1 tablet (30 mg total) by mouth daily.   magnesium oxide 400 MG tablet Commonly known as: MAG-OX Take 2 tablets (800 mg total) by mouth daily.   methocarbamol 500 MG tablet Commonly known as: ROBAXIN Take 500 mg by mouth as needed for muscle spasms.   omeprazole 20 MG capsule Commonly known as: PRILOSEC Take 20 mg by mouth daily.   oxyCODONE 5 MG immediate release tablet Commonly known as: Roxicodone Take 1 tablet (5 mg total) by mouth every 6 (six) hours as needed for severe pain (pain score 7-10).   Rena-Vite Rx 1 MG Tabs Take 1 tablet by mouth daily.   sacubitril-valsartan 24-26 MG Commonly known as: ENTRESTO Take 1 tablet by mouth 2 (two) times daily.   senna-docusate 8.6-50 MG tablet Commonly known as: Senokot-S Take 1 tablet by mouth daily.   SUMAtriptan 50 MG tablet Commonly known as: IMITREX Take 50 mg by mouth daily as needed for headache.   Ubrelvy 100 MG Tabs Generic drug: Ubrogepant Take 100 mg  by mouth daily as needed (migraine).   valACYclovir 500 MG tablet Commonly known as: VALTREX Take 500 mg by mouth daily as needed (cold sores).   Velphoro 500 MG chewable tablet Generic drug: sucroferric oxyhydroxide Chew 1,000 mg by mouth 3 (three) times daily with meals.   Vitamin D (Ergocalciferol) 1.25 MG (50000 UNIT) Caps capsule Commonly known as: DRISDOL Take 50,000 Units by mouth every Sunday.        Follow-up Information     Loura Pardon, PA. Schedule an appointment as soon  as possible for a visit in 1 week(s).   Specialty: Physician Assistant Contact information: [address] [place] [address] [phone]         Hemodialysis clinic Follow up.   Why: Follow at your usual schedule               Allergies  Allergen Reactions   Cantaloupe Extract Allergy Skin Test Itching    Mouth itching     Citrullus Vulgaris Itching and Other (See Comments)    Makes mouth itch, ALL melons     Food Itching    All melon - mouth itching   Strawberry Extract Itching    Mouth itching   Nsaids Itching and Other (See Comments)    Avoid per nephrology     Other Procedures/Studies: No results found.   TODAY-DAY OF DISCHARGE:  Subjective:   Kathy Lewis today has no headache,no chest abdominal pain,no new weakness tingling or numbness, feels much better wants to go home today.   Objective:   Blood pressure 100/65, pulse 70, temperature 98 F (36.7 C), resp. rate 17, height 5' (1.524 m), weight 53.9 kg, SpO2 98%.  Intake/Output Summary (Last 24 hours) at 07/14/2023 0906 Last data filed at 07/13/2023 1815 Gross per 24 hour  Intake --  Output 2515 ml  Net -2515 ml   Filed Weights   07/13/23 2033 07/14/23 0823  Weight: 53 kg 53.9 kg    Exam: Awake Alert, Oriented *3, No new F.N deficits, Normal affect [place].AT,PERRAL Supple Neck,No JVD, No cervical lymphadenopathy appriciated.  Symmetrical Chest wall movement, Good air  movement bilaterally, CTAB RRR,No Gallops,Rubs or new Murmurs, No Parasternal Heave +ve B.Sounds, Abd Soft, Non tender, No organomegaly appriciated, No rebound -guarding or rigidity. No Cyanosis, Clubbing or edema, No new Rash or bruise   PERTINENT RADIOLOGIC STUDIES: No results found.   PERTINENT LAB RESULTS: CBC: Recent Labs    07/13/23 0909 07/14/23 0333  WBC 7.2 7.4  HGB 12.0 10.6*  HCT 38.2 32.7*  PLT 152 175   CMET CMP     Component Value Date/Time   NA 133 (L) 07/14/2023 0333   K 4.5 07/14/2023 0333   CL 91 (L) 07/14/2023 0333   CO2 27 07/14/2023 0333   GLUCOSE 414 (H) 07/14/2023 0333   BUN 32 (H) 07/14/2023 0333   CREATININE 4.88 (H) 07/14/2023 0333   CALCIUM 8.6 (L) 07/14/2023 0333   PROT 6.9 07/14/2023 0333   ALBUMIN 3.5 07/14/2023 0333   AST 39 07/14/2023 0333   ALT 42 07/14/2023 0333   ALKPHOS 111 07/14/2023 0333   BILITOT 1.1 07/14/2023 0333   GFRNONAA 12 (L) 07/14/2023 0333    GFR Estimated Creatinine Clearance: 12.4 mL/min (A) (by C-G formula based on SCr of 4.88 mg/dL (H)). Recent Labs    07/11/23 1507 07/13/23 0909  LIPASE 27 18   No results for input(s): "CKTOTAL", "CKMB", "CKMBINDEX", "TROPONINI" in the last 72 hours. Invalid input(s): "POCBNP" No results for input(s): "DDIMER" in the last 72 hours. No results for input(s): "HGBA1C" in the last 72 hours. No results for input(s): "CHOL", "HDL", "LDLCALC", "TRIG", "CHOLHDL", "LDLDIRECT" in the last 72 hours. No results for input(s): "TSH", "T4TOTAL", "T3FREE", "THYROIDAB" in the last 72 hours.  Invalid input(s): "FREET3" No results for input(s): "VITAMINB12", "FOLATE", "FERRITIN", "TIBC", "IRON", "RETICCTPCT" in the last 72 hours. Coags: No results for input(s): "INR" in the last 72 hours.  Invalid input(s): "PT" Microbiology: No results found for this or any previous visit (  from the past 240 hour(s)).  FURTHER DISCHARGE INSTRUCTIONS:  Get Medicines reviewed and adjusted: Please  take all your medications with you for your next visit with your Primary MD  Laboratory/radiological data: Please request your Primary MD to go over all hospital tests and procedure/radiological results at the follow up, please ask your Primary MD to get all Hospital records sent to his/her office.  In some cases, they will be blood work, cultures and biopsy results pending at the time of your discharge. Please request that your primary care M.D. goes through all the records of your hospital data and follows up on these results.  Also Note the following: If you experience worsening of your admission symptoms, develop shortness of breath, life threatening emergency, suicidal or homicidal thoughts you must seek medical attention immediately by calling 911 or calling your MD immediately  if symptoms less severe.  You must read complete instructions/literature along with all the possible adverse reactions/side effects for all the Medicines you take and that have been prescribed to you. Take any new Medicines after you have completely understood and accpet all the possible adverse reactions/side effects.   Do not drive when taking Pain medications or sleeping medications (Benzodaizepines)  Do not take more than prescribed Pain, Sleep and Anxiety Medications. It is not advisable to combine anxiety,sleep and pain medications without talking with your primary care practitioner  Special Instructions: If you have smoked or chewed Tobacco  in the last 2 yrs please stop smoking, stop any regular Alcohol  and or any Recreational drug use.  Wear Seat belts while driving.  Please note: You were cared for by a hospitalist during your hospital stay. Once you are discharged, your primary care physician will handle any further medical issues. Please note that NO REFILLS for any discharge medications will be authorized once you are discharged, as it is imperative that you return to your primary care physician (or  establish a relationship with a primary care physician if you do not have one) for your post hospital discharge needs so that they can reassess your need for medications and monitor your lab values.  Total Time spent coordinating discharge including counseling, education and face to face time equals greater than 30 minutes.  SignedJeoffrey Massed 07/14/2023 9:06 AM

## 2023-07-14 NOTE — Progress Notes (Signed)
Subjective: Seen and of HD, no current complaints abdominal pain resolved, said tolerated HD.  Noted for discharge  Objective Vital signs in last 24 hours: Vitals:   07/14/23 1030 07/14/23 1100 07/14/23 1130 07/14/23 1215  BP: 104/78 114/82 106/79 109/81  Pulse: 70 71 70   Resp: 15 13 (!) 25   Temp:    98.5 F (36.9 C)  TempSrc:    Axillary  SpO2: 100% 100% 99%   Weight:      Height:       Weight change:   Physical Exam: General: Alert NAD Heart: RRR no MRG Lungs: CTA anteriorly nonlabored breathing Abdomen: NABS soft NT ND Extremities: No pedal edema Dialysis Access: R internal jugular TDC    Renal-related home meds: - phoslo 2 ac tid - coreg 25 bid - eliquis 2.5 bid - duragesic patch q 3d - hydralazine 100 bid - oxy IR prn - entresto 24-26 bid - valtrex 500 mg daily prn      OP HD: TTS  SW     3.5h  400/600   56kg  2/2 bath  RIJ TDC   Heparin 2500   Problem/Plan: Abdominal pain, N/V= admit team likely gastroparesis Hyperkalemia= 6.2-to 4.5 missed HD now resolved ESRD -HD TTS Acute toxic encephalopathy= rectal per admit secondary narcotics received in ER Uncontrolled hypertension= secondary to vomiting BP meds, volume appears stable anemia of ESRD-Hgb 12.0, 10.6, monitor no current ESA needs Secondary hyperparathyroidism -calcium phosphorus at goal.  PhosLo as binder DM T1= per admit team  Kathy Pastel, PA-C [facility] Beeper [phone] 07/14/2023,12:34 PM  LOS: 0 days   Labs: Basic Metabolic Panel: Recent Labs  Lab 07/11/23 1507 07/11/23 1647 07/13/23 0909 07/13/23 2053 07/14/23 0333  NA 138  --  135  --  133*  K 6.0* 5.9* 6.2*  --  4.5  CL 99  --  93*  --  91*  CO2 26  --  21*  --  27  GLUCOSE 202*  --  231*  --  414*  BUN 54*  --  41*  --  32*  CREATININE 6.61*  --  5.85*  --  4.88*  CALCIUM 9.3  --  9.3  --  8.6*  PHOS  --   --   --  6.3*  --    Liver Function Tests: Recent Labs  Lab 07/11/23 1507 07/13/23 0909  07/14/23 0333  AST 94* 59* 39  ALT 63* 53* 42  ALKPHOS 128* 120 111  BILITOT 1.1 1.6* 1.1  PROT 7.5 8.0 6.9  ALBUMIN 3.8 4.1 3.5   Recent Labs  Lab 07/11/23 1507 07/13/23 0909  LIPASE 27 18   No results for input(s): "AMMONIA" in the last 168 hours. CBC: Recent Labs  Lab 07/11/23 1507 07/13/23 0909 07/14/23 0333  WBC 3.8* 7.2 7.4  NEUTROABS  --  6.5  --   HGB 11.3* 12.0 10.6*  HCT 35.7* 38.2 32.7*  MCV 103.8* 104.9* 98.2  PLT 142* 152 175   Cardiac Enzymes: No results for input(s): "CKTOTAL", "CKMB", "CKMBINDEX", "TROPONINI" in the last 168 hours. CBG: Recent Labs  Lab 07/11/23 1848 07/13/23 0806 07/13/23 1202 07/13/23 1259 07/13/23 2044  GLUCAP 119* 216* 285* 262* 200*    Studies/Results: No results found. Medications:  anticoagulant sodium citrate      apixaban  2.5 mg Oral BID   atorvastatin  40 mg Oral QPM   carvedilol  25 mg Oral BID WC   Chlorhexidine Gluconate Cloth  6 each Topical Q0600   Chlorhexidine Gluconate Cloth  6 each Topical Q0600   hydrALAZINE  100 mg Oral BID   insulin aspart  0-6 Units Subcutaneous TID WC   insulin glargine-yfgn  12 Units Subcutaneous Daily   isosorbide mononitrate  30 mg Oral Daily   pantoprazole  40 mg Oral Daily   sacubitril-valsartan  1 tablet Oral BID   sucroferric oxyhydroxide  1,000 mg Oral TID WC

## 2023-07-15 NOTE — Progress Notes (Signed)
[facility] Patient Discharge Orders- [facility] CLINIC: [facility]  Patient's name: Astra Sparaco Admit/DC Dates: 07/13/2023 - 07/14/2023  Discharge Diagnoses: Vomiting/abdominal pain  -due to gastroparesis  Hyperkalemia  missed hd  Acute toxic encephalopathy = 2/2 narcotics    Aranesp: Given: no   Date and amount of last dose: no  Last Hgb: 10.6 PRBC's Given: 0 Date/# of units: 0 ESA dose for discharge: mircera perm protocal mcg IV q 2 weeks  IV Iron dose at discharge: 0  Heparin change: no  EDW Change: yes New EDW: 54  Bath Change: no  Access intervention/Change: no Details:  Hectorol/Calcitriol change: no  Discharge Labs: Calcium 8/6 Phosphorus 6.3 Albumin 3.5 K+ 4.5  IV Antibiotics: no Details:  On Coumadin?: no Last INR: Next INR: Managed By:   OTHER/APPTS/LAB ORDERS:    D/C Meds to be reconciled by nurse after every discharge.  Completed By:   Reviewed by: MD:______ RN_______

## 2023-07-16 NOTE — Progress Notes (Signed)
Late Note Entry- Nov. 18, 2024  Pt was d/c on Saturday. Contacted [facility] this morning to advise clinic of pt's d/c date and that pt should resume care tomorrow.   Olivia Canter Renal Navigator [phone]

## 2023-07-17 LAB — HEPATITIS B SURFACE ANTIBODY, QUANTITATIVE: Hep B S AB Quant (Post): 829 m[IU]/mL

## 2023-07-18 ENCOUNTER — Encounter (HOSPITAL_BASED_OUTPATIENT_CLINIC_OR_DEPARTMENT_OTHER): Payer: Self-pay | Admitting: Emergency Medicine

## 2023-07-18 ENCOUNTER — Emergency Department (HOSPITAL_BASED_OUTPATIENT_CLINIC_OR_DEPARTMENT_OTHER): Payer: 59

## 2023-07-18 ENCOUNTER — Other Ambulatory Visit: Payer: Self-pay

## 2023-07-18 ENCOUNTER — Emergency Department (HOSPITAL_BASED_OUTPATIENT_CLINIC_OR_DEPARTMENT_OTHER)
Admission: EM | Admit: 2023-07-18 | Discharge: 2023-07-18 | Disposition: A | Discharge status: Home / Self Care | Payer: 59 | Attending: Emergency Medicine | Admitting: Emergency Medicine

## 2023-07-18 DIAGNOSIS — R1032 Left lower quadrant pain: Secondary | ICD-10-CM | POA: Insufficient documentation

## 2023-07-18 DIAGNOSIS — R051 Acute cough: Secondary | ICD-10-CM | POA: Insufficient documentation

## 2023-07-18 DIAGNOSIS — I120 Hypertensive chronic kidney disease with stage 5 chronic kidney disease or end stage renal disease: Secondary | ICD-10-CM | POA: Insufficient documentation

## 2023-07-18 DIAGNOSIS — Z992 Dependence on renal dialysis: Secondary | ICD-10-CM | POA: Diagnosis not present

## 2023-07-18 DIAGNOSIS — E1122 Type 2 diabetes mellitus with diabetic chronic kidney disease: Secondary | ICD-10-CM | POA: Insufficient documentation

## 2023-07-18 DIAGNOSIS — N186 End stage renal disease: Secondary | ICD-10-CM | POA: Insufficient documentation

## 2023-07-18 DIAGNOSIS — Z1152 Encounter for screening for COVID-19: Secondary | ICD-10-CM | POA: Diagnosis not present

## 2023-07-18 DIAGNOSIS — Z794 Long term (current) use of insulin: Secondary | ICD-10-CM | POA: Insufficient documentation

## 2023-07-18 DIAGNOSIS — R112 Nausea with vomiting, unspecified: Secondary | ICD-10-CM | POA: Diagnosis not present

## 2023-07-18 DIAGNOSIS — Z7901 Long term (current) use of anticoagulants: Secondary | ICD-10-CM | POA: Diagnosis not present

## 2023-07-18 LAB — CBC WITH DIFFERENTIAL/PLATELET
Abs Immature Granulocytes: 0.01 10*3/uL (ref 0.00–0.07)
Basophils Absolute: 0 10*3/uL (ref 0.0–0.1)
Basophils Relative: 1 %
Eosinophils Absolute: 0.2 10*3/uL (ref 0.0–0.5)
Eosinophils Relative: 3 %
HCT: 40.9 % (ref 36.0–46.0)
Hemoglobin: 13.8 g/dL (ref 12.0–15.0)
Immature Granulocytes: 0 %
Lymphocytes Relative: 18 %
Lymphs Abs: 1.3 10*3/uL (ref 0.7–4.0)
MCH: 32.8 pg (ref 26.0–34.0)
MCHC: 33.7 g/dL (ref 30.0–36.0)
MCV: 97.1 fL (ref 80.0–100.0)
Monocytes Absolute: 0.7 10*3/uL (ref 0.1–1.0)
Monocytes Relative: 10 %
Neutro Abs: 4.9 10*3/uL (ref 1.7–7.7)
Neutrophils Relative %: 68 %
Platelets: 177 10*3/uL (ref 150–400)
RBC: 4.21 MIL/uL (ref 3.87–5.11)
RDW: 15.6 % — ABNORMAL HIGH (ref 11.5–15.5)
WBC: 7.1 10*3/uL (ref 4.0–10.5)
nRBC: 0 % (ref 0.0–0.2)

## 2023-07-18 LAB — COMPREHENSIVE METABOLIC PANEL
ALT: 26 U/L (ref 0–44)
AST: 26 U/L (ref 15–41)
Albumin: 3.3 g/dL — ABNORMAL LOW (ref 3.5–5.0)
Alkaline Phosphatase: 125 U/L (ref 38–126)
Anion gap: 12 (ref 5–15)
BUN: 41 mg/dL — ABNORMAL HIGH (ref 6–20)
CO2: 24 mmol/L (ref 22–32)
Calcium: 8 mg/dL — ABNORMAL LOW (ref 8.9–10.3)
Chloride: 100 mmol/L (ref 98–111)
Creatinine, Ser: 6.13 mg/dL — ABNORMAL HIGH (ref 0.44–1.00)
GFR, Estimated: 9 mL/min — ABNORMAL LOW (ref >60)
Glucose, Bld: 140 mg/dL — ABNORMAL HIGH (ref 70–99)
Potassium: 4.5 mmol/L (ref 3.5–5.1)
Sodium: 136 mmol/L (ref 135–145)
Total Bilirubin: 1.2 mg/dL — ABNORMAL HIGH (ref <1.2)
Total Protein: 6.9 g/dL (ref 6.5–8.1)

## 2023-07-18 LAB — RESP PANEL BY RT-PCR (RSV, FLU A&B, COVID)  RVPGX2
Influenza A by PCR: NEGATIVE
Influenza B by PCR: NEGATIVE
Resp Syncytial Virus by PCR: NEGATIVE
SARS Coronavirus 2 by RT PCR: NEGATIVE

## 2023-07-18 LAB — CBG MONITORING, ED: Glucose-Capillary: 145 mg/dL — ABNORMAL HIGH (ref 70–99)

## 2023-07-18 LAB — HCG, SERUM, QUALITATIVE: Preg, Serum: NEGATIVE

## 2023-07-18 LAB — LACTIC ACID, PLASMA: Lactic Acid, Venous: 1.3 mmol/L (ref 0.5–1.9)

## 2023-07-18 LAB — MAGNESIUM: Magnesium: 2.1 mg/dL (ref 1.7–2.4)

## 2023-07-18 MED ORDER — AZITHROMYCIN 250 MG PO TABS: 250.0000 mg | ORAL_TABLET | Freq: Every day | ORAL | 0 refills | Status: DC

## 2023-07-18 MED ORDER — FENTANYL CITRATE PF 50 MCG/ML IJ SOSY
25.0000 ug | PREFILLED_SYRINGE | Freq: Once | INTRAMUSCULAR | Status: AC
  Administered 2023-07-18: 25 ug via INTRAVENOUS
  Filled 2023-07-18: qty 1

## 2023-07-18 MED ORDER — BENZONATATE 100 MG PO CAPS: 100.0000 mg | ORAL_CAPSULE | Freq: Three times a day (TID) | ORAL | 0 refills | Status: DC

## 2023-07-18 MED ORDER — PROCHLORPERAZINE EDISYLATE 10 MG/2ML IJ SOLN
10.0000 mg | Freq: Once | INTRAMUSCULAR | Status: AC
  Administered 2023-07-18: 10 mg via INTRAVENOUS
  Filled 2023-07-18: qty 2

## 2023-07-18 MED ORDER — FENTANYL CITRATE PF 50 MCG/ML IJ SOSY: 50.0000 ug | PREFILLED_SYRINGE | Freq: Once | INTRAMUSCULAR | Status: DC

## 2023-07-18 MED ORDER — DICYCLOMINE HCL 20 MG PO TABS: 20.0000 mg | ORAL_TABLET | Freq: Two times a day (BID) | ORAL | 0 refills | Status: DC

## 2023-07-18 MED ORDER — DICYCLOMINE HCL 10 MG PO CAPS
10.0000 mg | ORAL_CAPSULE | Freq: Once | ORAL | Status: AC
  Administered 2023-07-18: 10 mg via ORAL
  Filled 2023-07-18: qty 1

## 2023-07-18 NOTE — ED Notes (Signed)
Pt has Omnipod on right lower abdomen.

## 2023-07-18 NOTE — ED Notes (Signed)
Placed patient on 4L [place] to maintain oxygen saturation of greater than 90%. Patient tolerating well.

## 2023-07-18 NOTE — ED Notes (Signed)
Pt given gatorlyte zero for PO rehydration.

## 2023-07-18 NOTE — ED Triage Notes (Signed)
Pt c/o abdominal pain with N/V. Discharged on Saturday from the [facility] for the same. Last dialysis was Tuesday.

## 2023-07-18 NOTE — Discharge Instructions (Addendum)
Please follow-up with your primary care provider regarding your mild cough, follow-up with your regular scheduled dialysis sessions as well.  Your abdominal pain improved in the Emergency Department with treatment.  Your CT imaging showed improvement, some opacity seen in the lungs and with your cough over the past week, considered viral infection versus developing pneumonia, will treat with a course of azithromycin and Tessalon.  Follow-up outpatient with gastroenterology regarding your chronic abdominal pain.  Return to the emergency department for any severe worsening symptoms.

## 2023-07-18 NOTE — ED Notes (Signed)
Pt's O2 59% on RA. Pt placed on 4lpm [place] by RRT, O2 increased to 100% after a couple of minutes. EDP at bedside the whole time.

## 2023-07-18 NOTE — ED Provider Notes (Signed)
[place] [facility] AT [facility] Provider Note   CSN: [id number] Arrival date & time: 07/18/23  0221     History  Chief Complaint  Patient presents with   Abdominal Pain    Kathy Lewis is a 27 y.o. female.  HPI    27 year old female with a past medical history of ESRD on TTS HD, hypertension, diabetes, VTE on Eliquis and chronic abdominal pain presenting to the emerged part with abdominal pain, nausea and vomiting.  The patient last underwent dialysis yesterday.  She states that this evening she developed acute onset left-sided abdominal pain.  She feels that the characterization of her pain is slightly different this time with some focality in the left lower quadrant.  She still makes urine, denies any dysuria.  She endorses nausea and vomiting.  She is not currently on any home opiates.  She had been prescribed a short course previously but has run out.  She denies any fevers or chills, cough, shortness of breath, chest pain.  Home Medications Prior to Admission medications   Medication Sig Start Date End Date Taking? Authorizing Provider  azithromycin (ZITHROMAX) 250 MG tablet Take 1 tablet (250 mg total) by mouth daily. Take first 2 tablets together, then 1 every day until finished. 07/18/23  Yes Ernie Avena, MD  benzonatate (TESSALON) 100 MG capsule Take 1 capsule (100 mg total) by mouth every 8 (eight) hours. 07/18/23  Yes Ernie Avena, MD  dicyclomine (BENTYL) 20 MG tablet Take 1 tablet (20 mg total) by mouth 2 (two) times daily. 07/18/23  Yes Ernie Avena, MD  acetaminophen (TYLENOL) 500 MG tablet Take 1,000 mg by mouth daily as needed for headache (pain).    [provider]  amitriptyline (ELAVIL) 50 MG tablet Take 1 tablet (50 mg total) by mouth at bedtime. 08/30/22   Shanna Cisco, NP  atorvastatin (LIPITOR) 40 MG tablet Take 1 tablet (40 mg total) by mouth every evening. 05/07/23   Bensimhon, Bevelyn Buckles, MD  B Complex-C-Folic Acid  (RENA-VITE RX) 1 MG TABS Take 1 tablet by mouth daily. 01/01/23   [provider]  butalbital-acetaminophen-caffeine (FIORICET) 50-325-40 MG tablet Take 1 tablet by mouth every 6 (six) hours as needed for migraine. 11/15/21   [provider]  calcium acetate (PHOSLO) 667 MG capsule Take 1,334 mg by mouth 3 (three) times daily with meals. 09/28/22   [provider]  carvedilol (COREG) 25 MG tablet Take 1 tablet (25 mg total) by mouth 2 (two) times daily with a meal. 05/07/23   Bensimhon, Bevelyn Buckles, MD  clonazePAM (KLONOPIN) 0.5 MG tablet Take 1 tablet (0.5 mg total) by mouth 2 (two) times daily as needed for anxiety. Patient taking differently: Take 0.5 mg by mouth at bedtime. 03/16/22   Leroy Sea, MD  ELIQUIS 2.5 MG TABS tablet Take 2.5 mg by mouth 2 (two) times daily.    [provider]  famotidine (PEPCID) 20 MG tablet Take 1 tablet (20 mg total) by mouth 2 (two) times daily. 07/11/23   Elayne Snare K, DO  hydrALAZINE (APRESOLINE) 100 MG tablet Take 1 tablet (100 mg total) by mouth 2 (two) times daily. 05/07/23   Bensimhon, Bevelyn Buckles, MD  hydrOXYzine (ATARAX) 10 MG tablet Take 10 mg by mouth daily. Also taken before dialysis on Tues, Thurs, Sat.    [provider]  insulin lispro (HUMALOG) 100 UNIT/ML injection Inject 0.5 Units into the skin See admin instructions. 0.5 units per hour via insulin pump.  01/08/22   [provider]  isosorbide mononitrate (IMDUR) 30 MG 24 hr tablet Take 1 tablet (30 mg total) by mouth daily. 10/09/22 11/20/23  Arrien, York Ram, MD  magnesium oxide (MAG-OX) 400 MG tablet Take 2 tablets (800 mg total) by mouth daily. 05/07/23   Bensimhon, Bevelyn Buckles, MD  methocarbamol (ROBAXIN) 500 MG tablet Take 500 mg by mouth as needed for muscle spasms.    [provider]  omeprazole (PRILOSEC) 20 MG capsule Take 20 mg by mouth daily. 04/11/23   [provider]  oxyCODONE (ROXICODONE) 5 MG immediate release  tablet Take 1 tablet (5 mg total) by mouth every 6 (six) hours as needed for severe pain (pain score 7-10). 07/11/23   Elayne Snare K, DO  sacubitril-valsartan (ENTRESTO) 24-26 MG Take 1 tablet by mouth 2 (two) times daily.    [provider]  senna-docusate (SENOKOT-S) 8.6-50 MG tablet Take 1 tablet by mouth daily. 07/11/23   Elayne Snare K, DO  SUMAtriptan (IMITREX) 50 MG tablet Take 50 mg by mouth daily as needed for headache.    [provider]  UBRELVY 100 MG TABS Take 100 mg by mouth daily as needed (migraine). 07/09/23   [provider]  valACYclovir (VALTREX) 500 MG tablet Take 500 mg by mouth daily as needed (cold sores). 04/17/22   [provider]  VELPHORO 500 MG chewable tablet Chew 1,000 mg by mouth 3 (three) times daily with meals. 06/21/23   [provider]  Vitamin D, Ergocalciferol, (DRISDOL) 1.25 MG (50000 UNIT) CAPS capsule Take 50,000 Units by mouth every Sunday.    [provider]  escitalopram (LEXAPRO) 10 MG tablet Take 20 mg by mouth daily.  05/10/20 10/06/20  [provider]  furosemide (LASIX) 40 MG tablet Take 1 tablet (40 mg total) by mouth daily. Patient not taking: Reported on 02/16/2021 10/28/19 02/17/21  Geryl Rankins, MD  lisinopril (ZESTRIL) 10 MG tablet Take 10 mg by mouth daily.  02/23/20 10/06/20  [provider]  spironolactone (ALDACTONE) 25 MG tablet Take 25 mg by mouth daily.  04/20/20 10/06/20  [provider]      Allergies    Cantaloupe extract allergy skin test, Citrullus vulgaris, Food, Strawberry extract, and Nsaids    Review of Systems   Review of Systems  All other systems reviewed and are negative.   Physical Exam Updated Vital Signs BP 115/80   Pulse 84   Temp 98.1 F (36.7 C)   Resp 14   Ht 5' (1.524 m)   Wt 53.9 kg   SpO2 98%   BMI 23.21 kg/m  Physical Exam Vitals and nursing note reviewed.  Constitutional:      Appearance: She is well-developed.   HENT:     Head: Normocephalic and atraumatic.  Eyes:     Conjunctiva/sclera: Conjunctivae normal.  Cardiovascular:     Rate and Rhythm: Normal rate and regular rhythm.  Pulmonary:     Effort: Pulmonary effort is normal. No respiratory distress.     Breath sounds: Normal breath sounds.  Abdominal:     Palpations: Abdomen is soft.     Tenderness: There is abdominal tenderness in the left upper quadrant and left lower quadrant. There is no guarding or rebound.  Musculoskeletal:        General: No swelling.     Cervical back: Neck supple.  Skin:    General: Skin is warm and dry.     Capillary Refill: Capillary refill takes less than  2 seconds.  Neurological:     Mental Status: She is alert.  Psychiatric:        Mood and Affect: Mood is anxious.     ED Results / Procedures / Treatments   Labs (all labs ordered are listed, but only abnormal results are displayed) Labs Reviewed  CBC WITH DIFFERENTIAL/PLATELET - Abnormal; Notable for the following components:      Result Value   RDW 15.6 (*)    All other components within normal limits  COMPREHENSIVE METABOLIC PANEL - Abnormal; Notable for the following components:   Glucose, Bld 140 (*)    BUN 41 (*)    Creatinine, Ser 6.13 (*)    Calcium 8.0 (*)    Albumin 3.3 (*)    Total Bilirubin 1.2 (*)    GFR, Estimated 9 (*)    All other components within normal limits  CBG MONITORING, ED - Abnormal; Notable for the following components:   Glucose-Capillary 145 (*)    All other components within normal limits  RESP PANEL BY RT-PCR (RSV, FLU A&B, COVID)  RVPGX2  LACTIC ACID, PLASMA  MAGNESIUM  HCG, SERUM, QUALITATIVE    EKG EKG Interpretation Date/Time:  Wednesday July 18 2023 02:33:10 EST Ventricular Rate:  100 PR Interval:  156 QRS Duration:  97 QT Interval:  372 QTC Calculation: 480 R Axis:   -67  Text Interpretation: Sinus tachycardia Left atrial enlargement Left anterior fascicular block LVH with secondary  repolarization abnormality Borderline prolonged QT interval Baseline wander in lead(s) II III aVR aVF V3 V4 V5 V6 Confirmed by Ernie Avena ([id number]) on 07/18/2023 3:22:49 AM  Radiology CT ABDOMEN PELVIS WO CONTRAST  Result Date: 07/18/2023 CLINICAL DATA:  27 year old female with abdominal pain, nausea vomiting. End stage renal disease on peritoneal dialysis, heart failure. EXAM: CT ABDOMEN AND PELVIS WITHOUT CONTRAST TECHNIQUE: Multidetector CT imaging of the abdomen and pelvis was performed following the standard protocol without IV contrast. RADIATION DOSE REDUCTION: This exam was performed according to the departmental dose-optimization program which includes automated exposure control, adjustment of the mA and/or kV according to patient size and/or use of iterative reconstruction technique. COMPARISON:  CT Abdomen and Pelvis 06/25/2023 and earlier. FINDINGS: Lower chest: Stable cardiomegaly with small or trace pericardial effusion (series 301, image 7). Pericardial fluid has simple density. Dialysis type catheter partially visible at the right atrium. Slightly lower lung volumes. Patchy bilateral lower lobe peribronchial opacity is nonspecific. No pleural effusion. Hepatobiliary: Hyperdense liver might indicate amiodarone therapy. Negative noncontrast gallbladder. Trace perihepatic free fluid, regressed since last month. No peritoneal dialysis catheter identified. Pancreas: Partially atrophied. Spleen: Negative. Adrenals/Urinary Tract: Normal adrenal glands. Stable nonobstructed noncontrast kidneys. Renal vascular calcification bilaterally. Mild chronic asymmetry of the left ureter with no convincing ureteral calculus, stable pelvic vascular calcifications. Stable nonspecific urinary bladder wall thickening. No perivesical stranding. Stomach/Bowel: Large bowel retained stool mixed with oral contrast or similar hyperdense material. Moderate volume retained stool. Normal appendix on series 301, image 54. No  large bowel inflammation identified. No dilated small bowel. Retained food in the stomach. Duodenum is decompressed. No pneumoperitoneum. Mesenteric congestion appears regressed since last month. No convincing active mesenteric inflammation. Vascular/Lymphatic: Visceral calcified atherosclerosis in the abdomen. Femoral artery calcified atherosclerosis bilaterally. Vascular patency is not evaluated in the absence of IV contrast. No lymphadenopathy identified. Reproductive: Negative noncontrast appearance. Other: No convincing pelvis free fluid. Musculoskeletal: No acute osseous abnormality identified. IMPRESSION: 1. Regression of mesenteric congestion and ascites since last month. No peritoneal dialysis catheter. 2.  No new acute or inflammatory process identified in the noncontrast abdomen. Moderate volume retained stool. 3. Patchy new bilateral lower lobe peribronchial opacity is indeterminate for atelectasis versus infection. No pleural effusion. 4. Stable cardiomegaly, trace simple appearing small or trace pericardial effusion. 5. Calcified atherosclerosis. Electronically Signed   By: Odessa Fleming M.D.   On: 07/18/2023 04:48   DG Chest Portable 1 View  Result Date: 07/18/2023 CLINICAL DATA:  Hypoxia, nausea, vomiting EXAM: PORTABLE CHEST 1 VIEW COMPARISON:  07/14/2023 FINDINGS: Right dialysis catheter remains in place, unchanged. Stable cardiomegaly, vascular congestion. Improving bibasilar opacities. No overt edema, confluent opacities or effusions. No acute bony abnormality. IMPRESSION: Cardiomegaly, vascular congestion. Electronically Signed   By: Charlett Nose M.D.   On: 07/18/2023 03:55    Procedures Procedures    Medications Ordered in ED Medications  prochlorperazine (COMPAZINE) injection 10 mg (10 mg Intravenous Given 07/18/23 0254)  fentaNYL (SUBLIMAZE) injection 25 mcg (25 mcg Intravenous Given 07/18/23 0343)  dicyclomine (BENTYL) capsule 10 mg (10 mg Oral Given 07/18/23 0503)    ED Course/  Medical Decision Making/ A&P                                 Medical Decision Making Amount and/or Complexity of Data Reviewed Labs: ordered. Radiology: ordered.  Risk Prescription drug management.     27 year old female with a past medical history of ESRD on TTS HD, hypertension, diabetes, VTE on Eliquis and chronic abdominal pain presenting to the emerged part with abdominal pain, nausea and vomiting.  The patient last underwent dialysis yesterday.  She states that this evening she developed acute onset left-sided abdominal pain.  She feels that the characterization of her pain is slightly different this time with some focality in the left lower quadrant.  She still makes urine, denies any dysuria.  She endorses nausea and vomiting.  She is not currently on any home opiates.  She had been prescribed a short course previously but has run out.  She denies any fevers or chills, cough, shortness of breath, chest pain.  On arrival, the patient was afebrile, vitally stable.  Physical exam with generalized abdominal discomfort, some focality in the left side of the abdomen.  Patient with chronic abdominal pain, previously followed outpatient with gastroenterology, has had negative gastric emptying studies in the past, some thought that her discomfort and chronic nausea and vomiting was due to gastroparesis in the setting of her diabetes.  Has not seen a gastroenterologist since January of this past year.  Recent hospitalization from 11/15 to 11/16 for vomiting and abdominal pain.  Lungs CTAB on exam, patient in no respiratory distress.  Has not missed dialysis last dialyzing yesterday.  Will obtain screening labs.  The patient does endorse a new focality in the left abdomen, will obtain CT to further evaluate.  Labs: CBG 145, magnesium 2.1, hCG negative, lactic acid normal, CMP with normal potassium at 4.5, no biliary abnormality reversed with only mild hyperbilirubin anemia to 1.2, LFTs normal, CBC  without a leukocytosis or anemia.  In and out cath attempted with no urine available.  CXR: IMPRESSION:  Cardiomegaly, vascular congestion.    CT Abd Pel: IMPRESSION:  1. Regression of mesenteric congestion and ascites since last month.  No peritoneal dialysis catheter.  2. No new acute or inflammatory process identified in the  noncontrast abdomen. Moderate volume retained stool.  3. Patchy new bilateral lower lobe peribronchial opacity is  indeterminate for atelectasis versus infection. No pleural effusion.  4. Stable cardiomegaly, trace simple appearing small or trace  pericardial effusion.  5. Calcified atherosclerosis.   Patient was administered IV fentanyl, IV Compazine and oral Bentyl.  She was notably able to orally rehydrate and passed a p.o. challenge.  She was feeling symptomatically improved and was requesting discharge.  I advised that the patient follow-up outpatient with gastroenterology for further outpatient evaluation and management.  The patient CT scan did show patchy new bilateral lower lobe bronchial opacity indeterminate for atelectasis versus infection with no pericardial effusion.  The patient does state that she has had a slight cough over the past week that is mildly productive, no fevers or chills, the patient has no leukocytosis and is overall well-appearing and vitally stable and is not on oxygen.  She could have a viral infection versus developing community-acquired pneumonia based on CT imaging read.  Will discharge on a course of azithromycin advised PCP follow-up.  Covid and influenza PCR testing collected. Return precautions provided.   Final Clinical Impression(s) / ED Diagnoses Final diagnoses:  Left lower quadrant abdominal pain  Nausea and vomiting, unspecified vomiting type  Acute cough    Rx / DC Orders ED Discharge Orders          Ordered    dicyclomine (BENTYL) 20 MG tablet  2 times daily        07/18/23 0531    azithromycin (ZITHROMAX)  250 MG tablet  Daily        07/18/23 0531    benzonatate (TESSALON) 100 MG capsule  Every 8 hours        07/18/23 0531              Ernie Avena, MD 07/18/23 [phone]

## 2023-07-18 NOTE — ED Notes (Signed)
Attempted in and out cath w NT, unable to obtain urine sample. EDP notified

## 2023-07-19 ENCOUNTER — Encounter (HOSPITAL_COMMUNITY): Payer: Medicare Other | Admitting: Internal Medicine

## 2023-08-02 ENCOUNTER — Inpatient Hospital Stay (HOSPITAL_BASED_OUTPATIENT_CLINIC_OR_DEPARTMENT_OTHER)
Admission: EM | Admit: 2023-08-02 | Discharge: 2023-08-07 | DRG: 073 | Disposition: A | Discharge status: Home / Self Care | Payer: 59 | Attending: Internal Medicine | Admitting: Internal Medicine

## 2023-08-02 ENCOUNTER — Emergency Department (HOSPITAL_BASED_OUTPATIENT_CLINIC_OR_DEPARTMENT_OTHER): Payer: 59

## 2023-08-02 ENCOUNTER — Other Ambulatory Visit: Payer: Self-pay

## 2023-08-02 DIAGNOSIS — F419 Anxiety disorder, unspecified: Secondary | ICD-10-CM | POA: Diagnosis present

## 2023-08-02 DIAGNOSIS — I132 Hypertensive heart and chronic kidney disease with heart failure and with stage 5 chronic kidney disease, or end stage renal disease: Secondary | ICD-10-CM | POA: Diagnosis present

## 2023-08-02 DIAGNOSIS — Z992 Dependence on renal dialysis: Secondary | ICD-10-CM

## 2023-08-02 DIAGNOSIS — E875 Hyperkalemia: Secondary | ICD-10-CM | POA: Diagnosis present

## 2023-08-02 DIAGNOSIS — N186 End stage renal disease: Secondary | ICD-10-CM

## 2023-08-02 DIAGNOSIS — R1084 Generalized abdominal pain: Secondary | ICD-10-CM | POA: Diagnosis not present

## 2023-08-02 DIAGNOSIS — R000 Tachycardia, unspecified: Secondary | ICD-10-CM | POA: Diagnosis present

## 2023-08-02 DIAGNOSIS — R031 Nonspecific low blood-pressure reading: Secondary | ICD-10-CM | POA: Diagnosis not present

## 2023-08-02 DIAGNOSIS — I82C11 Acute embolism and thrombosis of right internal jugular vein: Secondary | ICD-10-CM | POA: Diagnosis present

## 2023-08-02 DIAGNOSIS — I3139 Other pericardial effusion (noninflammatory): Secondary | ICD-10-CM | POA: Diagnosis present

## 2023-08-02 DIAGNOSIS — Z86711 Personal history of pulmonary embolism: Secondary | ICD-10-CM

## 2023-08-02 DIAGNOSIS — F3181 Bipolar II disorder: Secondary | ICD-10-CM | POA: Diagnosis present

## 2023-08-02 DIAGNOSIS — R112 Nausea with vomiting, unspecified: Secondary | ICD-10-CM | POA: Diagnosis present

## 2023-08-02 DIAGNOSIS — G43909 Migraine, unspecified, not intractable, without status migrainosus: Secondary | ICD-10-CM | POA: Diagnosis present

## 2023-08-02 DIAGNOSIS — Z87891 Personal history of nicotine dependence: Secondary | ICD-10-CM

## 2023-08-02 DIAGNOSIS — Z86718 Personal history of other venous thrombosis and embolism: Secondary | ICD-10-CM

## 2023-08-02 DIAGNOSIS — R9431 Abnormal electrocardiogram [ECG] [EKG]: Secondary | ICD-10-CM | POA: Diagnosis present

## 2023-08-02 DIAGNOSIS — K3184 Gastroparesis: Secondary | ICD-10-CM | POA: Diagnosis present

## 2023-08-02 DIAGNOSIS — Z8619 Personal history of other infectious and parasitic diseases: Secondary | ICD-10-CM

## 2023-08-02 DIAGNOSIS — E1043 Type 1 diabetes mellitus with diabetic autonomic (poly)neuropathy: Principal | ICD-10-CM | POA: Diagnosis present

## 2023-08-02 DIAGNOSIS — Z9641 Presence of insulin pump (external) (internal): Secondary | ICD-10-CM | POA: Diagnosis present

## 2023-08-02 DIAGNOSIS — Z7901 Long term (current) use of anticoagulants: Secondary | ICD-10-CM

## 2023-08-02 DIAGNOSIS — N390 Urinary tract infection, site not specified: Secondary | ICD-10-CM | POA: Diagnosis present

## 2023-08-02 DIAGNOSIS — I10 Essential (primary) hypertension: Secondary | ICD-10-CM | POA: Diagnosis present

## 2023-08-02 DIAGNOSIS — N2581 Secondary hyperparathyroidism of renal origin: Secondary | ICD-10-CM | POA: Diagnosis present

## 2023-08-02 DIAGNOSIS — E785 Hyperlipidemia, unspecified: Secondary | ICD-10-CM | POA: Diagnosis present

## 2023-08-02 DIAGNOSIS — Z794 Long term (current) use of insulin: Secondary | ICD-10-CM

## 2023-08-02 DIAGNOSIS — Z91018 Allergy to other foods: Secondary | ICD-10-CM

## 2023-08-02 DIAGNOSIS — R1110 Vomiting, unspecified: Principal | ICD-10-CM

## 2023-08-02 DIAGNOSIS — I5022 Chronic systolic (congestive) heart failure: Secondary | ICD-10-CM | POA: Diagnosis present

## 2023-08-02 DIAGNOSIS — Z886 Allergy status to analgesic agent status: Secondary | ICD-10-CM

## 2023-08-02 DIAGNOSIS — G8929 Other chronic pain: Secondary | ICD-10-CM | POA: Diagnosis present

## 2023-08-02 DIAGNOSIS — I2699 Other pulmonary embolism without acute cor pulmonale: Secondary | ICD-10-CM | POA: Diagnosis present

## 2023-08-02 DIAGNOSIS — E1065 Type 1 diabetes mellitus with hyperglycemia: Secondary | ICD-10-CM | POA: Diagnosis present

## 2023-08-02 DIAGNOSIS — Z79899 Other long term (current) drug therapy: Secondary | ICD-10-CM

## 2023-08-02 DIAGNOSIS — E1022 Type 1 diabetes mellitus with diabetic chronic kidney disease: Secondary | ICD-10-CM | POA: Diagnosis present

## 2023-08-02 DIAGNOSIS — N179 Acute kidney failure, unspecified: Secondary | ICD-10-CM | POA: Diagnosis present

## 2023-08-02 LAB — CBC
HCT: 40.6 % (ref 36.0–46.0)
Hemoglobin: 13.2 g/dL (ref 12.0–15.0)
MCH: 32.4 pg (ref 26.0–34.0)
MCHC: 32.5 g/dL (ref 30.0–36.0)
MCV: 99.5 fL (ref 80.0–100.0)
Platelets: 227 10*3/uL (ref 150–400)
RBC: 4.08 MIL/uL (ref 3.87–5.11)
RDW: 14.6 % (ref 11.5–15.5)
WBC: 5.4 10*3/uL (ref 4.0–10.5)
nRBC: 0 % (ref 0.0–0.2)

## 2023-08-02 LAB — COMPREHENSIVE METABOLIC PANEL
ALT: 17 U/L (ref 0–44)
AST: 25 U/L (ref 15–41)
Albumin: 4.1 g/dL (ref 3.5–5.0)
Alkaline Phosphatase: 132 U/L — ABNORMAL HIGH (ref 38–126)
Anion gap: 16 — ABNORMAL HIGH (ref 5–15)
BUN: 23 mg/dL — ABNORMAL HIGH (ref 6–20)
CO2: 28 mmol/L (ref 22–32)
Calcium: 9.1 mg/dL (ref 8.9–10.3)
Chloride: 92 mmol/L — ABNORMAL LOW (ref 98–111)
Creatinine, Ser: 4.87 mg/dL — ABNORMAL HIGH (ref 0.44–1.00)
GFR, Estimated: 12 mL/min — ABNORMAL LOW (ref >60)
Glucose, Bld: 284 mg/dL — ABNORMAL HIGH (ref 70–99)
Potassium: 4.7 mmol/L (ref 3.5–5.1)
Sodium: 136 mmol/L (ref 135–145)
Total Bilirubin: 1.3 mg/dL — ABNORMAL HIGH (ref <1.2)
Total Protein: 8.4 g/dL — ABNORMAL HIGH (ref 6.5–8.1)

## 2023-08-02 LAB — CBG MONITORING, ED: Glucose-Capillary: 275 mg/dL — ABNORMAL HIGH (ref 70–99)

## 2023-08-02 LAB — I-STAT VENOUS BLOOD GAS, ED
Acid-Base Excess: 6 mmol/L — ABNORMAL HIGH (ref 0.0–2.0)
Bicarbonate: 32 mmol/L — ABNORMAL HIGH (ref 20.0–28.0)
Calcium, Ion: 1.05 mmol/L — ABNORMAL LOW (ref 1.15–1.40)
HCT: 42 % (ref 36.0–46.0)
Hemoglobin: 14.3 g/dL (ref 12.0–15.0)
O2 Saturation: 63 %
Patient temperature: 98.4
Potassium: 4.5 mmol/L (ref 3.5–5.1)
Sodium: 137 mmol/L (ref 135–145)
TCO2: 33 mmol/L — ABNORMAL HIGH (ref 22–32)
pCO2, Ven: 48.6 mm[Hg] (ref 44–60)
pH, Ven: 7.426 (ref 7.25–7.43)
pO2, Ven: 32 mm[Hg] (ref 32–45)

## 2023-08-02 LAB — LIPASE, BLOOD: Lipase: 27 U/L (ref 11–51)

## 2023-08-02 LAB — LACTIC ACID, PLASMA: Lactic Acid, Venous: 1.8 mmol/L (ref 0.5–1.9)

## 2023-08-02 MED ORDER — METOCLOPRAMIDE HCL 5 MG/ML IJ SOLN
10.0000 mg | Freq: Once | INTRAMUSCULAR | Status: AC
  Administered 2023-08-02: 10 mg via INTRAVENOUS
  Filled 2023-08-02: qty 2

## 2023-08-02 MED ORDER — HYDROMORPHONE HCL 1 MG/ML IJ SOLN
0.5000 mg | Freq: Once | INTRAMUSCULAR | Status: AC
  Administered 2023-08-02: 0.5 mg via INTRAVENOUS
  Filled 2023-08-02: qty 1

## 2023-08-02 MED ORDER — SODIUM CHLORIDE 0.9 % IV BOLUS
500.0000 mL | Freq: Once | INTRAVENOUS | Status: AC
  Administered 2023-08-02: 500 mL via INTRAVENOUS

## 2023-08-02 MED ORDER — ONDANSETRON HCL 4 MG/2ML IJ SOLN
4.0000 mg | Freq: Once | INTRAMUSCULAR | Status: AC
  Administered 2023-08-02: 4 mg via INTRAVENOUS
  Filled 2023-08-02: qty 2

## 2023-08-02 NOTE — Plan of Care (Incomplete)
Plan of Care Note for accepted transfer  Patient: Kathy Lewis              KGM:[id number]  DOA: 08/02/2023     Facility requesting transfer: [facility] emergency department Requesting Provider: Dr. Maple Hudson  Reason for transfer:   Facility course: 27 year old female history of ESRD on hemodialysis TTS schedule, prolonged QT syndrome, chronic heart failure with reduced EF, DM type 1, chronic gastroparesis, normocytic anemia, right IJ DVT on Eliquis and bipolar disorder presented to emergency department complaining of generalized abdominal pain. Reports symptoms started this morning went to dialysis symptoms worsened with nausea and vomiting. No fever chills. She has been seen in the emergency department several occasions for similar type abdominal pain. Reports that it is similar type pain.   At presentation to ED patient found to have blood pressure 153/125 which trended up to 174/129 and tachycardic 115.  Tachypneic 25 and O2 sat 100% room air. VBG showed unremarkable except metabolic alkalosis 33. Lactic acid within normal range. Lipase 27 WNL. CMP showing low chloride 92, elevated blood glucose 248, elevated alkaline phosphatase 132, elevated bilirubin 1.3 GFR 12 and elevated anion gap 16. CBC unremarkable. Chest x-ray cardiomegaly with vascular congestion and mild diffuse interstitial edema.     Plan of care: The patient is accepted for admission for inpatient/observation status to {levelofcareprnv:26563} unit, at {campuslistprnv:26564}.  Check [email or web address] for on-call coverage.  [facility] will assume care on arrival to accepting facility. Until arrival, medical decision making responsibilities remain with the EDP.  However, [facility] available 24/7 for questions and assistance.   Nursing staff please page [facility] Admits and Consults [phone]) as soon as the patient arrives to the hospital.    Author: Tereasa Coop, MD  08/02/2023  [facility]

## 2023-08-02 NOTE — Plan of Care (Addendum)
Plan of Care Note for accepted transfer  Patient: Kathy Lewis              ZOX:[id number]  DOA: 08/02/2023     Facility [facility] transfer[facility] emergency department Requesting Provider: Dr. Maple Hudson  Reason for transfer: Intractable nausea and vomiting with associated abdominal pain  Facility course: 27 year old female history of ESRD on hemodialysis TTS schedule, prolonged QT syndrome, chronic heart failure with reduced EF, DM type 1, chronic gastroparesis, normocytic anemia, right IJ DVT on Eliquis and bipolar disorder presented to emergency department complaining of generalized abdominal pain. Reports symptoms started this morning went to dialysis symptoms worsened with nausea and vomiting. No fever chills. She has been seen in the emergency department several occasions for similar type abdominal pain. Reports that it is similar type pain.   At presentation to ED patient found to have blood pressure 153/125 which trended up to 174/129 and tachycardic 115.  Tachypneic 25 and O2 sat 100% room air. VBG showed unremarkable except metabolic alkalosis 33. Lactic acid within normal range. Lipase 27 WNL. CMP showing low chloride 92, elevated blood glucose 248, elevated alkaline phosphatase 132, elevated bilirubin 1.3 GFR 12 and elevated anion gap 16. CBC unremarkable. Chest x-ray cardiomegaly with vascular congestion and mild diffuse interstitial edema.  ED physician Dr. Maple Hudson reported that abdominal exam no sign of acute abdomen.   Even though patient got Zofran and Reglan in the ED still having persistent nausea and vomiting. In the ED patient got NS 500 mL of bolus and Dilaudid 0.5 mg.  Recommended Dr. Maple Hudson to obtain x-ray abdomen to make sure patient does not have any small bowel obstruction.   Plan of care: The patient is accepted for admission for inpatient status to Progressive unit, at [facility].  Check [email or web address] for on-call coverage.  [facility] will assume  care on arrival to accepting facility. Until arrival, medical decision making responsibilities remain with the EDP.  However, [facility] available 24/7 for questions and assistance.   Nursing staff please page [facility] Admits and Consults [phone]) as soon as the patient arrives to the hospital.    Author: Tereasa Coop, MD  08/02/2023  [facility]

## 2023-08-02 NOTE — ED Notes (Signed)
Found pt on the floor of the lobby when called by registration for help. Patient was awake but lethargic. With the help of a second tech, helped the pt get on a wheelchair and to room 1 for triage.

## 2023-08-02 NOTE — ED Triage Notes (Signed)
Pt POV c/o abd pain starting this AM. Also reports n/v. Denies fevers, denies diarrhea. Last PD today.

## 2023-08-02 NOTE — ED Provider Notes (Signed)
[facility] Provider Note   CSN: [id number] Arrival date & time: 08/02/23  2033     History  Chief Complaint  Patient presents with   Abdominal Pain    Kathy Lewis is a 27 y.o. female.  This is a 27 year old female history of ESRD presents with generalized abdominal pain.  Reports symptoms started this morning went to dialysis symptoms worsened with nausea and vomiting.  No fever chills.  She has been seen in the emergency department several occasions for similar type abdominal pain.  Reports that it is similar type pain.   Abdominal Pain      Home Medications Prior to Admission medications   Medication Sig Start Date End Date Taking? Authorizing Provider  acetaminophen (TYLENOL) 500 MG tablet Take 1,000 mg by mouth daily as needed for headache (pain).    [provider]  amitriptyline (ELAVIL) 50 MG tablet Take 1 tablet (50 mg total) by mouth at bedtime. 08/30/22   Shanna Cisco, NP  atorvastatin (LIPITOR) 40 MG tablet Take 1 tablet (40 mg total) by mouth every evening. 05/07/23   Bensimhon, Bevelyn Buckles, MD  azithromycin (ZITHROMAX) 250 MG tablet Take 1 tablet (250 mg total) by mouth daily. Take first 2 tablets together, then 1 every day until finished. 07/18/23   Ernie Avena, MD  B Complex-C-Folic Acid (RENA-VITE RX) 1 MG TABS Take 1 tablet by mouth daily. 01/01/23   [provider]  benzonatate (TESSALON) 100 MG capsule Take 1 capsule (100 mg total) by mouth every 8 (eight) hours. 07/18/23   Ernie Avena, MD  butalbital-acetaminophen-caffeine (FIORICET) [phone] MG tablet Take 1 tablet by mouth every 6 (six) hours as needed for migraine. 11/15/21   [provider]  calcium acetate (PHOSLO) 667 MG capsule Take 1,334 mg by mouth 3 (three) times daily with meals. 09/28/22   [provider]  carvedilol (COREG) 25 MG tablet Take 1 tablet (25 mg total) by mouth 2 (two) times daily with a meal. 05/07/23    Bensimhon, Bevelyn Buckles, MD  clonazePAM (KLONOPIN) 0.5 MG tablet Take 1 tablet (0.5 mg total) by mouth 2 (two) times daily as needed for anxiety. Patient taking differently: Take 0.5 mg by mouth at bedtime. 03/16/22   Leroy Sea, MD  dicyclomine (BENTYL) 20 MG tablet Take 1 tablet (20 mg total) by mouth 2 (two) times daily. 07/18/23   Ernie Avena, MD  ELIQUIS 2.5 MG TABS tablet Take 2.5 mg by mouth 2 (two) times daily.    [provider]  famotidine (PEPCID) 20 MG tablet Take 1 tablet (20 mg total) by mouth 2 (two) times daily. 07/11/23   Elayne Snare K, DO  hydrALAZINE (APRESOLINE) 100 MG tablet Take 1 tablet (100 mg total) by mouth 2 (two) times daily. 05/07/23   Bensimhon, Bevelyn Buckles, MD  hydrOXYzine (ATARAX) 10 MG tablet Take 10 mg by mouth daily. Also taken before dialysis on Tues, Thurs, Sat.    [provider]  insulin lispro (HUMALOG) 100 UNIT/ML injection Inject 0.5 Units into the skin See admin instructions. 0.5 units per hour via insulin pump. 01/08/22   [provider]  isosorbide mononitrate (IMDUR) 30 MG 24 hr tablet Take 1 tablet (30 mg total) by mouth daily. 10/09/22 11/20/23  Arrien, York Ram, MD  magnesium oxide (MAG-OX) 400 MG tablet Take 2 tablets (800 mg total) by mouth daily. 05/07/23   Bensimhon, Bevelyn Buckles, MD  methocarbamol (ROBAXIN) 500 MG tablet Take 500 mg  by mouth as needed for muscle spasms.    [provider]  omeprazole (PRILOSEC) 20 MG capsule Take 20 mg by mouth daily. 04/11/23   [provider]  oxyCODONE (ROXICODONE) 5 MG immediate release tablet Take 1 tablet (5 mg total) by mouth every 6 (six) hours as needed for severe pain (pain score 7-10). 07/11/23   Elayne Snare K, DO  sacubitril-valsartan (ENTRESTO) 24-26 MG Take 1 tablet by mouth 2 (two) times daily.    [provider]  senna-docusate (SENOKOT-S) 8.6-50 MG tablet Take 1 tablet by mouth daily. 07/11/23   Elayne Snare K, DO  SUMAtriptan  (IMITREX) 50 MG tablet Take 50 mg by mouth daily as needed for headache.    [provider]  UBRELVY 100 MG TABS Take 100 mg by mouth daily as needed (migraine). 07/09/23   [provider]  valACYclovir (VALTREX) 500 MG tablet Take 500 mg by mouth daily as needed (cold sores). 04/17/22   [provider]  VELPHORO 500 MG chewable tablet Chew 1,000 mg by mouth 3 (three) times daily with meals. 06/21/23   [provider]  Vitamin D, Ergocalciferol, (DRISDOL) 1.25 MG (50000 UNIT) CAPS capsule Take 50,000 Units by mouth every Sunday.    [provider]  escitalopram (LEXAPRO) 10 MG tablet Take 20 mg by mouth daily.  05/10/20 10/06/20  [provider]  furosemide (LASIX) 40 MG tablet Take 1 tablet (40 mg total) by mouth daily. Patient not taking: Reported on 02/16/2021 10/28/19 02/17/21  Geryl Rankins, MD  lisinopril (ZESTRIL) 10 MG tablet Take 10 mg by mouth daily.  02/23/20 10/06/20  [provider]  spironolactone (ALDACTONE) 25 MG tablet Take 25 mg by mouth daily.  04/20/20 10/06/20  [provider]      Allergies    Cantaloupe extract allergy skin test, Citrullus vulgaris, Food, Strawberry extract, and Nsaids    Review of Systems   Review of Systems  Gastrointestinal:  Positive for abdominal pain.    Physical Exam Updated Vital Signs BP (!) 174/129   Pulse (!) 115   Temp 98.4 F (36.9 C) (Oral)   Resp (!) 26   Ht 5' (1.524 m)   SpO2 96%   BMI 23.21 kg/m  Physical Exam Vitals and nursing note reviewed.  Constitutional:      General: She is not in acute distress.    Appearance: She is not toxic-appearing.  HENT:     Head: Normocephalic.  Cardiovascular:     Rate and Rhythm: Regular rhythm. Tachycardia present.  Pulmonary:     Effort: Pulmonary effort is normal.     Breath sounds: Normal breath sounds.  Abdominal:     General: Abdomen is flat.     Palpations: Abdomen is soft.     Tenderness: There is generalized  abdominal tenderness (minor).  Genitourinary:    Rectum: Normal.  Skin:    General: Skin is warm and dry.  Neurological:     Mental Status: She is alert.     ED Results / Procedures / Treatments   Labs (all labs ordered are listed, but only abnormal results are displayed) Labs Reviewed  COMPREHENSIVE METABOLIC PANEL - Abnormal; Notable for the following components:      Result Value   Chloride 92 (*)    Glucose, Bld 284 (*)    BUN 23 (*)    Creatinine, Ser 4.87 (*)    Total Protein 8.4 (*)    Alkaline Phosphatase 132 (*)  Total Bilirubin 1.3 (*)    GFR, Estimated 12 (*)    Anion gap 16 (*)    All other components within normal limits  CBG MONITORING, ED - Abnormal; Notable for the following components:   Glucose-Capillary 275 (*)    All other components within normal limits  I-STAT VENOUS BLOOD GAS, ED - Abnormal; Notable for the following components:   Bicarbonate 32.0 (*)    TCO2 33 (*)    Acid-Base Excess 6.0 (*)    Calcium, Ion 1.05 (*)    All other components within normal limits  CBC  LIPASE, BLOOD  LACTIC ACID, PLASMA  HCG, QUANTITATIVE, PREGNANCY  URINALYSIS, ROUTINE W REFLEX MICROSCOPIC  LACTIC ACID, PLASMA    EKG EKG Interpretation Date/Time:  Thursday August 02 2023 21:04:48 EST Ventricular Rate:  120 PR Interval:  145 QRS Duration:  97 QT Interval:  350 QTC Calculation: 495 R Axis:   -82  Text Interpretation: Sinus tachycardia Probable left atrial enlargement Left anterior fascicular block Borderline T abnormalities, inferior leads Borderline prolonged QT interval Confirmed by Estanislado Pandy [phone]) on 08/02/2023 9:13:41 PM  Radiology DG Chest Portable 1 View  Result Date: 08/02/2023 CLINICAL DATA:  Shortness of breath EXAM: PORTABLE CHEST 1 VIEW COMPARISON:  07/18/2023 FINDINGS: Right-sided central venous catheter tip at the right atrium. Cardiomegaly with vascular congestion and diffuse interstitial edema. No pleural effusion or pneumothorax.  IMPRESSION: Cardiomegaly with vascular congestion and mild diffuse interstitial edema Electronically Signed   By: Jasmine Pang M.D.   On: 08/02/2023 22:35    Procedures Procedures    Medications Ordered in ED Medications  HYDROmorphone (DILAUDID) injection 0.5 mg (0.5 mg Intravenous Given 08/02/23 2243)  metoCLOPramide (REGLAN) injection 10 mg (10 mg Intravenous Given 08/02/23 2243)  ondansetron (ZOFRAN) injection 4 mg (4 mg Intravenous Given 08/02/23 2327)  sodium chloride 0.9 % bolus 500 mL (500 mLs Intravenous New Bag/Given 08/02/23 2325)    ED Course/ Medical Decision Making/ A&P Clinical Course as of 08/02/23 2358  Thu Aug 02, 2023  2114 Per chart review admitted on 11/15 for hyperK and abdominal pain.  [TY]  2157 Per chart review patient has had 6 CT abdomens in the past year which have all seemingly been normal per my review. [TY]  2218 Comprehensive metabolic panel(!) Anion gap noted.  Bicarb normal.  Does have elevated glucose.  BUN/creatinine consistent with known ESRD.  No transaminitis to suggest hepatobiliary disease. [TY]  2219 Lipase: 27 Pancreatitis less likely [TY]  2219 CBC No leukocytosis to suggest systemic infection. [TY]  2258 DG Chest Portable 1 View IMPRESSION: Cardiomegaly with vascular congestion and mild diffuse interstitial edema   [TY]  2319 Patient reports improvement of abd pain, but continues to have nausea and vomiting.  [TY]  2351 Patient continues to vomit despite Zofran.  Will admit for intractable nausea vomiting [TY]    Clinical Course User Index [TY] Coral Spikes, DO                                 Medical Decision Making Is a 27 year old female presenting emergency department for abdominal pain.  Complex past medical history to include, ESRD on dialysis, type 1 diabetes, heart failure, chronic abdominal pain.  To she is afebrile, but is tachycardic.  Elevated blood pressure; slightly tachypneic.  On exam has diffuse abdominal tenderness,  but soft on exam.  Per chart review has had recent admission on 11/15.  Has had numerous CT scans of all been normal.  Per chart review she was thought to have had gastroparesis, but subsequently had negative gastric emptying.  She has no leukocytosis to suggest systemic infection.  She is hyperglycemic, but does not appear to be in DKA.  Lactate normal.  Mesenteric ischemia unlikely.  Lipase normal.  Pancreatitis unlikely.  VBG not consistent with DKA.  Patient received IV Dilaudid for pain, Reglan.  Heart rate remains elevated.  Small fluid bolus given her ESRD. Will re-evaluate; see ED Course for further MDM/Dispotion.   Amount and/or Complexity of Data Reviewed External Data Reviewed:     Details: See ED course.  Labs: ordered. Decision-making details documented in ED Course. Radiology: ordered. Decision-making details documented in ED Course.  Risk Prescription drug management.         Final Clinical Impression(s) / ED Diagnoses Final diagnoses:  Intractable vomiting    Rx / DC Orders ED Discharge Orders     None         Coral Spikes, DO 08/02/23 2358

## 2023-08-02 NOTE — ED Notes (Signed)
EDP Young made aware of pts loss of PIV access and staff working to establish new [place] placed PIV.

## 2023-08-03 ENCOUNTER — Emergency Department (HOSPITAL_BASED_OUTPATIENT_CLINIC_OR_DEPARTMENT_OTHER): Payer: 59

## 2023-08-03 ENCOUNTER — Encounter (HOSPITAL_BASED_OUTPATIENT_CLINIC_OR_DEPARTMENT_OTHER): Payer: Self-pay | Admitting: Internal Medicine

## 2023-08-03 DIAGNOSIS — I10 Essential (primary) hypertension: Secondary | ICD-10-CM | POA: Diagnosis not present

## 2023-08-03 DIAGNOSIS — E875 Hyperkalemia: Secondary | ICD-10-CM | POA: Diagnosis present

## 2023-08-03 DIAGNOSIS — I132 Hypertensive heart and chronic kidney disease with heart failure and with stage 5 chronic kidney disease, or end stage renal disease: Secondary | ICD-10-CM | POA: Diagnosis present

## 2023-08-03 DIAGNOSIS — F419 Anxiety disorder, unspecified: Secondary | ICD-10-CM | POA: Diagnosis present

## 2023-08-03 DIAGNOSIS — K3184 Gastroparesis: Secondary | ICD-10-CM

## 2023-08-03 DIAGNOSIS — Z992 Dependence on renal dialysis: Secondary | ICD-10-CM

## 2023-08-03 DIAGNOSIS — E785 Hyperlipidemia, unspecified: Secondary | ICD-10-CM | POA: Diagnosis present

## 2023-08-03 DIAGNOSIS — R1084 Generalized abdominal pain: Secondary | ICD-10-CM | POA: Diagnosis present

## 2023-08-03 DIAGNOSIS — F3181 Bipolar II disorder: Secondary | ICD-10-CM | POA: Diagnosis present

## 2023-08-03 DIAGNOSIS — Z87891 Personal history of nicotine dependence: Secondary | ICD-10-CM | POA: Diagnosis not present

## 2023-08-03 DIAGNOSIS — N390 Urinary tract infection, site not specified: Secondary | ICD-10-CM | POA: Diagnosis present

## 2023-08-03 DIAGNOSIS — Z86711 Personal history of pulmonary embolism: Secondary | ICD-10-CM | POA: Diagnosis not present

## 2023-08-03 DIAGNOSIS — I5022 Chronic systolic (congestive) heart failure: Secondary | ICD-10-CM

## 2023-08-03 DIAGNOSIS — R112 Nausea with vomiting, unspecified: Secondary | ICD-10-CM | POA: Diagnosis not present

## 2023-08-03 DIAGNOSIS — Z9641 Presence of insulin pump (external) (internal): Secondary | ICD-10-CM | POA: Diagnosis present

## 2023-08-03 DIAGNOSIS — Z86718 Personal history of other venous thrombosis and embolism: Secondary | ICD-10-CM | POA: Diagnosis not present

## 2023-08-03 DIAGNOSIS — I82C11 Acute embolism and thrombosis of right internal jugular vein: Secondary | ICD-10-CM

## 2023-08-03 DIAGNOSIS — N186 End stage renal disease: Secondary | ICD-10-CM | POA: Diagnosis present

## 2023-08-03 DIAGNOSIS — E1043 Type 1 diabetes mellitus with diabetic autonomic (poly)neuropathy: Secondary | ICD-10-CM | POA: Diagnosis present

## 2023-08-03 DIAGNOSIS — Z8619 Personal history of other infectious and parasitic diseases: Secondary | ICD-10-CM | POA: Diagnosis not present

## 2023-08-03 DIAGNOSIS — I3139 Other pericardial effusion (noninflammatory): Secondary | ICD-10-CM | POA: Diagnosis present

## 2023-08-03 DIAGNOSIS — E1065 Type 1 diabetes mellitus with hyperglycemia: Secondary | ICD-10-CM

## 2023-08-03 DIAGNOSIS — R9431 Abnormal electrocardiogram [ECG] [EKG]: Secondary | ICD-10-CM | POA: Diagnosis present

## 2023-08-03 DIAGNOSIS — Z794 Long term (current) use of insulin: Secondary | ICD-10-CM | POA: Diagnosis not present

## 2023-08-03 DIAGNOSIS — N2581 Secondary hyperparathyroidism of renal origin: Secondary | ICD-10-CM | POA: Diagnosis present

## 2023-08-03 DIAGNOSIS — E1022 Type 1 diabetes mellitus with diabetic chronic kidney disease: Secondary | ICD-10-CM | POA: Diagnosis present

## 2023-08-03 DIAGNOSIS — N179 Acute kidney failure, unspecified: Secondary | ICD-10-CM | POA: Diagnosis present

## 2023-08-03 DIAGNOSIS — I2694 Multiple subsegmental pulmonary emboli without acute cor pulmonale: Secondary | ICD-10-CM

## 2023-08-03 LAB — COMPREHENSIVE METABOLIC PANEL
ALT: 15 U/L (ref 0–44)
AST: 21 U/L (ref 15–41)
Albumin: 3.9 g/dL (ref 3.5–5.0)
Alkaline Phosphatase: 130 U/L — ABNORMAL HIGH (ref 38–126)
Anion gap: 16 — ABNORMAL HIGH (ref 5–15)
BUN: 40 mg/dL — ABNORMAL HIGH (ref 6–20)
CO2: 25 mmol/L (ref 22–32)
Calcium: 9.3 mg/dL (ref 8.9–10.3)
Chloride: 95 mmol/L — ABNORMAL LOW (ref 98–111)
Creatinine, Ser: 6.07 mg/dL — ABNORMAL HIGH (ref 0.44–1.00)
GFR, Estimated: 9 mL/min — ABNORMAL LOW (ref >60)
Glucose, Bld: 363 mg/dL — ABNORMAL HIGH (ref 70–99)
Potassium: 6.1 mmol/L — ABNORMAL HIGH (ref 3.5–5.1)
Sodium: 136 mmol/L (ref 135–145)
Total Bilirubin: 1.3 mg/dL — ABNORMAL HIGH (ref <1.2)
Total Protein: 8.1 g/dL (ref 6.5–8.1)

## 2023-08-03 LAB — CBC WITH DIFFERENTIAL/PLATELET
Abs Immature Granulocytes: 0.01 10*3/uL (ref 0.00–0.07)
Basophils Absolute: 0 10*3/uL (ref 0.0–0.1)
Basophils Relative: 1 %
Eosinophils Absolute: 0 10*3/uL (ref 0.0–0.5)
Eosinophils Relative: 0 %
HCT: 39.1 % (ref 36.0–46.0)
Hemoglobin: 12.7 g/dL (ref 12.0–15.0)
Immature Granulocytes: 0 %
Lymphocytes Relative: 9 %
Lymphs Abs: 0.6 10*3/uL — ABNORMAL LOW (ref 0.7–4.0)
MCH: 32.3 pg (ref 26.0–34.0)
MCHC: 32.5 g/dL (ref 30.0–36.0)
MCV: 99.5 fL (ref 80.0–100.0)
Monocytes Absolute: 0.1 10*3/uL (ref 0.1–1.0)
Monocytes Relative: 2 %
Neutro Abs: 5.4 10*3/uL (ref 1.7–7.7)
Neutrophils Relative %: 88 %
Platelets: 201 10*3/uL (ref 150–400)
RBC: 3.93 MIL/uL (ref 3.87–5.11)
RDW: 14.6 % (ref 11.5–15.5)
WBC: 6.1 10*3/uL (ref 4.0–10.5)
nRBC: 0 % (ref 0.0–0.2)

## 2023-08-03 LAB — RENAL FUNCTION PANEL
Albumin: 3.7 g/dL (ref 3.5–5.0)
Anion gap: 16 — ABNORMAL HIGH (ref 5–15)
BUN: 40 mg/dL — ABNORMAL HIGH (ref 6–20)
CO2: 28 mmol/L (ref 22–32)
Calcium: 9.4 mg/dL (ref 8.9–10.3)
Chloride: 94 mmol/L — ABNORMAL LOW (ref 98–111)
Creatinine, Ser: 6.68 mg/dL — ABNORMAL HIGH (ref 0.44–1.00)
GFR, Estimated: 8 mL/min — ABNORMAL LOW (ref >60)
Glucose, Bld: 293 mg/dL — ABNORMAL HIGH (ref 70–99)
Phosphorus: 7.4 mg/dL — ABNORMAL HIGH (ref 2.5–4.6)
Potassium: 5.2 mmol/L — ABNORMAL HIGH (ref 3.5–5.1)
Sodium: 138 mmol/L (ref 135–145)

## 2023-08-03 LAB — HCG, QUANTITATIVE, PREGNANCY: hCG, Beta Chain, Quant, S: 3 m[IU]/mL (ref <5)

## 2023-08-03 LAB — I-STAT VENOUS BLOOD GAS, ED
Acid-Base Excess: 3 mmol/L — ABNORMAL HIGH (ref 0.0–2.0)
Bicarbonate: 28.8 mmol/L — ABNORMAL HIGH (ref 20.0–28.0)
Calcium, Ion: 1.12 mmol/L — ABNORMAL LOW (ref 1.15–1.40)
HCT: 41 % (ref 36.0–46.0)
Hemoglobin: 13.9 g/dL (ref 12.0–15.0)
O2 Saturation: 95 %
Patient temperature: 97.6
Potassium: 6.2 mmol/L — ABNORMAL HIGH (ref 3.5–5.1)
Sodium: 135 mmol/L (ref 135–145)
TCO2: 30 mmol/L (ref 22–32)
pCO2, Ven: 47.7 mm[Hg] (ref 44–60)
pH, Ven: 7.387 (ref 7.25–7.43)
pO2, Ven: 73 mm[Hg] — ABNORMAL HIGH (ref 32–45)

## 2023-08-03 LAB — CBG MONITORING, ED
Glucose-Capillary: 340 mg/dL — ABNORMAL HIGH (ref 70–99)
Glucose-Capillary: 349 mg/dL — ABNORMAL HIGH (ref 70–99)
Glucose-Capillary: 285 mg/dL — ABNORMAL HIGH (ref 70–99)

## 2023-08-03 LAB — HEPATITIS B SURFACE ANTIGEN: Hepatitis B Surface Ag: NONREACTIVE

## 2023-08-03 LAB — BETA-HYDROXYBUTYRIC ACID: Beta-Hydroxybutyric Acid: 1.08 mmol/L — ABNORMAL HIGH (ref 0.05–0.27)

## 2023-08-03 LAB — GLUCOSE, CAPILLARY: Glucose-Capillary: 271 mg/dL — ABNORMAL HIGH (ref 70–99)

## 2023-08-03 MED ORDER — INSULIN REGULAR HUMAN 100 UNIT/ML IJ SOLN: 5.0000 [IU] | Freq: Once | INTRAMUSCULAR | Status: DC

## 2023-08-03 MED ORDER — HYDROMORPHONE HCL 1 MG/ML IJ SOLN
1.0000 mg | INTRAMUSCULAR | Status: DC | PRN
  Administered 2023-08-04 – 2023-08-05 (×4): 1 mg via INTRAVENOUS
  Filled 2023-08-03 (×4): qty 1

## 2023-08-03 MED ORDER — CHLORHEXIDINE GLUCONATE CLOTH 2 % EX PADS
6.0000 | MEDICATED_PAD | Freq: Every day | CUTANEOUS | Status: DC
Start: 2023-08-04 — End: 2023-08-06
  Administered 2023-08-04 – 2023-08-06 (×3): 6 via TOPICAL

## 2023-08-03 MED ORDER — ACETAMINOPHEN 650 MG RE SUPP: 650.0000 mg | Freq: Four times a day (QID) | RECTAL | Status: DC | PRN

## 2023-08-03 MED ORDER — SUCROFERRIC OXYHYDROXIDE 500 MG PO CHEW
1000.0000 mg | CHEWABLE_TABLET | Freq: Three times a day (TID) | ORAL | Status: DC
  Administered 2023-08-04 – 2023-08-07 (×10): 1000 mg via ORAL
  Filled 2023-08-03 (×10): qty 2

## 2023-08-03 MED ORDER — UBROGEPANT 100 MG PO TABS: 100.0000 mg | ORAL_TABLET | Freq: Every day | ORAL | Status: DC | PRN

## 2023-08-03 MED ORDER — HYDROXYZINE HCL 10 MG PO TABS
10.0000 mg | ORAL_TABLET | Freq: Every day | ORAL | Status: DC
  Administered 2023-08-03 – 2023-08-07 (×5): 10 mg via ORAL
  Filled 2023-08-03 (×5): qty 1

## 2023-08-03 MED ORDER — INSULIN ASPART 100 UNIT/ML IJ SOLN
5.0000 [IU] | Freq: Once | INTRAMUSCULAR | Status: AC
Start: 2023-08-03 — End: 2023-08-03
  Administered 2023-08-03: 5 [IU] via INTRAVENOUS

## 2023-08-03 MED ORDER — LABETALOL HCL 5 MG/ML IV SOLN
5.0000 mg | Freq: Once | INTRAVENOUS | Status: AC
  Administered 2023-08-03: 5 mg via INTRAVENOUS
  Filled 2023-08-03: qty 4

## 2023-08-03 MED ORDER — CARVEDILOL 25 MG PO TABS
25.0000 mg | ORAL_TABLET | Freq: Two times a day (BID) | ORAL | Status: DC
  Administered 2023-08-03 – 2023-08-04 (×2): 25 mg via ORAL
  Filled 2023-08-03 (×4): qty 1

## 2023-08-03 MED ORDER — SODIUM CHLORIDE 0.9 % IV SOLN: INTRAVENOUS | Status: AC | PRN

## 2023-08-03 MED ORDER — HYDROMORPHONE HCL 1 MG/ML IJ SOLN
1.0000 mg | INTRAMUSCULAR | Status: DC | PRN
  Administered 2023-08-03 (×2): 1 mg via INTRAVENOUS
  Filled 2023-08-03 (×2): qty 1

## 2023-08-03 MED ORDER — NALOXONE HCL 0.4 MG/ML IJ SOLN: 0.4000 mg | INTRAMUSCULAR | Status: DC | PRN

## 2023-08-03 MED ORDER — APIXABAN 2.5 MG PO TABS
2.5000 mg | ORAL_TABLET | Freq: Two times a day (BID) | ORAL | Status: DC
  Administered 2023-08-04 – 2023-08-07 (×8): 2.5 mg via ORAL
  Filled 2023-08-03 (×8): qty 1

## 2023-08-03 MED ORDER — SODIUM ZIRCONIUM CYCLOSILICATE 10 G PO PACK
10.0000 g | PACK | Freq: Once | ORAL | Status: AC
  Administered 2023-08-03: 10 g via ORAL
  Filled 2023-08-03 (×2): qty 1

## 2023-08-03 MED ORDER — AMITRIPTYLINE HCL 50 MG PO TABS
50.0000 mg | ORAL_TABLET | Freq: Every day | ORAL | Status: DC
Start: 2023-08-03 — End: 2023-08-07
  Administered 2023-08-04 – 2023-08-06 (×4): 50 mg via ORAL
  Filled 2023-08-03 (×4): qty 1

## 2023-08-03 MED ORDER — DEXTROSE 50 % IV SOLN
1.0000 | Freq: Once | INTRAVENOUS | Status: AC
  Administered 2023-08-03: 50 mL via INTRAVENOUS

## 2023-08-03 MED ORDER — CLONAZEPAM 0.5 MG PO TABS: 0.5000 mg | ORAL_TABLET | Freq: Every day | ORAL | Status: DC | PRN

## 2023-08-03 MED ORDER — SACUBITRIL-VALSARTAN 24-26 MG PO TABS
1.0000 | ORAL_TABLET | Freq: Two times a day (BID) | ORAL | Status: DC
  Administered 2023-08-04 (×2): 1 via ORAL
  Filled 2023-08-03 (×2): qty 1

## 2023-08-03 MED ORDER — SODIUM CHLORIDE 0.9% FLUSH
3.0000 mL | Freq: Two times a day (BID) | INTRAVENOUS | Status: DC
  Administered 2023-08-04 – 2023-08-06 (×5): 3 mL via INTRAVENOUS

## 2023-08-03 MED ORDER — INSULIN PUMP
Freq: Three times a day (TID) | SUBCUTANEOUS | Status: DC
  Administered 2023-08-04: 1.3 via SUBCUTANEOUS
  Administered 2023-08-04: 2.3 via SUBCUTANEOUS
  Filled 2023-08-03: qty 1

## 2023-08-03 MED ORDER — ATORVASTATIN CALCIUM 40 MG PO TABS
40.0000 mg | ORAL_TABLET | Freq: Every evening | ORAL | Status: DC
  Administered 2023-08-03 – 2023-08-06 (×4): 40 mg via ORAL
  Filled 2023-08-03 (×4): qty 1

## 2023-08-03 MED ORDER — POLYETHYLENE GLYCOL 3350 17 G PO PACK: 17.0000 g | PACK | Freq: Every day | ORAL | Status: DC | PRN

## 2023-08-03 MED ORDER — NALOXONE HCL 0.4 MG/ML IJ SOLN: 0.4000 mg | Freq: Once | INTRAMUSCULAR | Status: AC

## 2023-08-03 MED ORDER — DEXTROSE 50 % IV SOLN
INTRAVENOUS | Status: AC
  Filled 2023-08-03: qty 50

## 2023-08-03 MED ORDER — PANTOPRAZOLE SODIUM 40 MG PO TBEC
40.0000 mg | DELAYED_RELEASE_TABLET | Freq: Every day | ORAL | Status: DC
  Administered 2023-08-04 – 2023-08-07 (×4): 40 mg via ORAL
  Filled 2023-08-03 (×4): qty 1

## 2023-08-03 MED ORDER — HEPARIN SODIUM (PORCINE) 1000 UNIT/ML IJ SOLN
4000.0000 [IU] | Freq: Once | INTRAMUSCULAR | Status: AC
  Administered 2023-08-07: 3200 [IU]
  Filled 2023-08-03: qty 4

## 2023-08-03 MED ORDER — ISOSORBIDE MONONITRATE ER 30 MG PO TB24
30.0000 mg | ORAL_TABLET | Freq: Every day | ORAL | Status: DC
  Administered 2023-08-04: 30 mg via ORAL
  Filled 2023-08-03: qty 1

## 2023-08-03 MED ORDER — PROCHLORPERAZINE EDISYLATE 10 MG/2ML IJ SOLN
10.0000 mg | Freq: Once | INTRAMUSCULAR | Status: AC
  Administered 2023-08-03: 10 mg via INTRAVENOUS
  Filled 2023-08-03: qty 2

## 2023-08-03 MED ORDER — CALCIUM GLUCONATE-NACL 1-0.675 GM/50ML-% IV SOLN
1.0000 g | Freq: Once | INTRAVENOUS | Status: AC
  Administered 2023-08-03: 1000 mg via INTRAVENOUS
  Filled 2023-08-03: qty 50

## 2023-08-03 MED ORDER — ONDANSETRON HCL 4 MG/2ML IJ SOLN
INTRAMUSCULAR | Status: AC
  Filled 2023-08-03: qty 2

## 2023-08-03 MED ORDER — DICYCLOMINE HCL 20 MG PO TABS
20.0000 mg | ORAL_TABLET | Freq: Two times a day (BID) | ORAL | Status: DC
  Administered 2023-08-04 – 2023-08-07 (×7): 20 mg via ORAL
  Filled 2023-08-03 (×10): qty 1

## 2023-08-03 MED ORDER — LORAZEPAM 2 MG/ML IJ SOLN
0.5000 mg | Freq: Once | INTRAMUSCULAR | Status: AC
  Administered 2023-08-03: 0.5 mg via INTRAVENOUS
  Filled 2023-08-03: qty 1

## 2023-08-03 MED ORDER — ACETAMINOPHEN 325 MG PO TABS
650.0000 mg | ORAL_TABLET | Freq: Four times a day (QID) | ORAL | Status: DC | PRN
  Administered 2023-08-04 (×2): 650 mg via ORAL
  Filled 2023-08-03 (×3): qty 2

## 2023-08-03 MED ORDER — HEPARIN SODIUM (PORCINE) 1000 UNIT/ML IJ SOLN
4000.0000 [IU] | Freq: Once | INTRAMUSCULAR | Status: DC
  Filled 2023-08-03 (×2): qty 4

## 2023-08-03 MED ORDER — CHLORHEXIDINE GLUCONATE CLOTH 2 % EX PADS
6.0000 | MEDICATED_PAD | Freq: Every day | CUTANEOUS | Status: DC
  Administered 2023-08-03: 6 via TOPICAL

## 2023-08-03 MED ORDER — MAGNESIUM SULFATE 2 GM/50ML IV SOLN
2.0000 g | Freq: Once | INTRAVENOUS | Status: AC
  Administered 2023-08-03: 2 g via INTRAVENOUS
  Filled 2023-08-03: qty 50

## 2023-08-03 MED ORDER — NALOXONE HCL 0.4 MG/ML IJ SOLN
INTRAMUSCULAR | Status: AC
  Administered 2023-08-03: 0.4 mg via INTRAVENOUS
  Filled 2023-08-03: qty 1

## 2023-08-03 MED ORDER — ONDANSETRON HCL 4 MG/2ML IJ SOLN
4.0000 mg | Freq: Once | INTRAMUSCULAR | Status: AC
Start: 2023-08-03 — End: 2023-08-03
  Administered 2023-08-03: 4 mg via INTRAVENOUS
  Filled 2023-08-03: qty 2

## 2023-08-03 NOTE — ED Notes (Signed)
13:47 unresponsive, SpO2 64%,  assisted resp, jaw thrust, Bag-Valve mask with O2 @ flush.  SpO2 increased 100% responding changed back to [place].  Dr Dalene Seltzer at bedside.

## 2023-08-03 NOTE — ED Notes (Signed)
Pt requesting to have admission cancelled.  She doesn't wish to be admitted.  Dr. Dalene Seltzer notified and will speak to patient

## 2023-08-03 NOTE — ED Notes (Signed)
Summoned into the room as the tech could not arouse the patient while getting CBG.  Pt lethargic, no rise and fall of chest.  Pulse ox dropping to 55% on O2 at 2L per [place].  Paged EDP to room.  Called for crash cart.  Started to bag pt with BVM at 15L.  Pt not responding to painful stimuli.  Unable to feel carotid pulse,,, pt received one CPR compression and yelled out.   Pt given Narcan, pt awakening to painful stimuli and talking but falls asleep quickly.  EDP at bedside immediately, able to palpate pulse.  Pt continues to fall asleep quickly.

## 2023-08-03 NOTE — ED Notes (Signed)
Called [facility] for transfer @11 :55.  Spoke with [employer]

## 2023-08-03 NOTE — ED Notes (Signed)
ED TO INPATIENT HANDOFF REPORT  ED Nurse Name and Phone #: Cleatrice Burke, RN  S Name/Age/Gender Kathy Lewis 27 y.o. female Room/Bed: MH01/MH01  Code Status   Code Status: Prior  Home/SNF/Other Home Patient oriented to: self, place, time, and situation Is this baseline? Yes   Triage Complete: Triage complete  Chief Complaint Intractable nausea and vomiting [R11.2]  Triage Note Pt POV c/o abd pain starting this AM. Also reports n/v. Denies fevers, denies diarrhea. Last PD today.     Allergies Allergies  Allergen Reactions   Cantaloupe Extract Allergy Skin Test Itching and Other (See Comments)    Mouth itching     Citrullus Vulgaris Itching and Other (See Comments)    Makes mouth itch, ALL melons     Food Itching and Other (See Comments)    All melon - mouth itching   Strawberry Extract Itching and Other (See Comments)    Mouth itching   Nsaids Itching and Other (See Comments)    Avoid per nephrology    Level of Care/Admitting Diagnosis ED Disposition     ED Disposition  Admit   Condition  --   Comment  Hospital Area: [facility] [[id number]]  Level of Care: Progressive [102]  Admit to Progressive based on following criteria: NEPHROLOGY stable condition requiring close monitoring for AKI, requiring Hemodialysis or Peritoneal Dialysis either from expected electrolyte imbalance, acidosis, or fluid overload that can be managed by NIPPV or high flow oxygen.  May admit patient to [facility] or [facility] if equivalent level of care is available:: No  Interfacility transfer: Yes  Covid Evaluation: Asymptomatic - no recent exposure (last 10 days) testing not required  Diagnosis: Intractable nausea and vomiting [720114]  Admitting Physician: Tereasa Coop [[id number]]  Attending Physician: Tereasa Coop [[id number]]  Certification:: I certify this patient will need inpatient services for at least 2 midnights  Expected Medical Readiness: 08/08/2023           B Medical/Surgery History Past Medical History:  Diagnosis Date   Anemia    Anxiety    Bipolar 2 disorder (HCC)    Chronic kidney disease    Chronic systolic (congestive) heart failure (HCC)    Depression    DKA (diabetic ketoacidoses)    ESRD on peritoneal dialysis (HCC)    HSV infection    on valtrex   Hypokalemia    Leukocytosis    Migraine    Noncompliance with medication regimen    Preeclampsia    Prolonged QT syndrome    Severe anemia    Type 1 diabetes mellitus (HCC)    Past Surgical History:  Procedure Laterality Date   BIOPSY  04/24/2022   Procedure: BIOPSY;  Surgeon: Jenel Lucks, MD;  Location: [facility] ENDOSCOPY;  Service: Gastroenterology;;   CARDIAC CATHETERIZATION     COLONOSCOPY WITH PROPOFOL N/A 04/24/2022   Procedure: COLONOSCOPY WITH PROPOFOL;  Surgeon: Jenel Lucks, MD;  Location: [facility] ENDOSCOPY;  Service: Gastroenterology;  Laterality: N/A;   DILATION AND EVACUATION N/A 10/22/2019   Procedure: ULTRASOUND GUIDED DILATATION AND EVACUATION;  Surgeon: Geryl Rankins, MD;  Location: [facility] LD ORS;  Service: Gynecology;  Laterality: N/A;   ESOPHAGOGASTRODUODENOSCOPY (EGD) WITH PROPOFOL N/A 04/24/2022   Procedure: ESOPHAGOGASTRODUODENOSCOPY (EGD) WITH PROPOFOL;  Surgeon: Jenel Lucks, MD;  Location: [facility] ENDOSCOPY;  Service: Gastroenterology;  Laterality: N/A;   IR FLUORO GUIDE CV LINE RIGHT  09/14/2022   IR US GUIDE VASC ACCESS RIGHT  09/14/2022   peritoneal dialysis catheter insertion  RENAL BIOPSY       A IV Location/Drains/Wounds Patient Lines/Drains/Airways Status     Active Line/Drains/Airways     Name Placement date Placement time Site Days   Peripheral IV 08/02/23 20 G 1.88" Anterior;Distal;Right;Upper Arm 08/02/23  2240  Arm  1   Hemodialysis Catheter Right Internal jugular Double lumen Permanent (Tunneled) 09/14/22  1520  Internal jugular  323            Intake/Output Last 24 hours  Intake/Output Summary (Last 24  hours) at 08/03/2023 1440 Last data filed at 08/03/2023 1314 Gross per 24 hour  Intake 409.14 ml  Output --  Net 409.14 ml    Labs/Imaging Results for orders placed or performed during the hospital encounter of 08/02/23 (from the past 48 hour(s))  CBG monitoring, ED     Status: Abnormal   Collection Time: 08/02/23  9:09 PM  Result Value Ref Range   Glucose-Capillary 275 (H) 70 - 99 mg/dL    Comment: Glucose reference range applies only to samples taken after fasting for at least 8 hours.   Comment 1 Notify RN   CBC     Status: None   Collection Time: 08/02/23  9:34 PM  Result Value Ref Range   WBC 5.4 4.0 - 10.5 K/uL   RBC 4.08 3.87 - 5.11 MIL/uL   Hemoglobin 13.2 12.0 - 15.0 g/dL   HCT 09.8 11.9 - 14.7 %   MCV 99.5 80.0 - 100.0 fL   MCH 32.4 26.0 - 34.0 pg   MCHC 32.5 30.0 - 36.0 g/dL   RDW 82.9 56.2 - 13.0 %   Platelets 227 150 - 400 K/uL   nRBC 0.0 0.0 - 0.2 %    Comment: Performed at [facility], [address] [place], [place] [address]  Comprehensive metabolic panel     Status: Abnormal   Collection Time: 08/02/23  9:34 PM  Result Value Ref Range   Sodium 136 135 - 145 mmol/L   Potassium 4.7 3.5 - 5.1 mmol/L   Chloride 92 (L) 98 - 111 mmol/L   CO2 28 22 - 32 mmol/L   Glucose, Bld 284 (H) 70 - 99 mg/dL    Comment: Glucose reference range applies only to samples taken after fasting for at least 8 hours.   BUN 23 (H) 6 - 20 mg/dL   Creatinine, Ser 4.69 (H) 0.44 - 1.00 mg/dL   Calcium 9.1 8.9 - 62.9 mg/dL   Total Protein 8.4 (H) 6.5 - 8.1 g/dL   Albumin 4.1 3.5 - 5.0 g/dL   AST 25 15 - 41 U/L   ALT 17 0 - 44 U/L   Alkaline Phosphatase 132 (H) 38 - 126 U/L   Total Bilirubin 1.3 (H) <1.2 mg/dL   GFR, Estimated 12 (L) >60 mL/min    Comment: (NOTE) Calculated using the CKD-EPI Creatinine Equation (2021)    Anion gap 16 (H) 5 - 15    Comment: Performed at [facility], [address] [place], [place] [address]  Lipase, blood     Status: None    Collection Time: 08/02/23  9:34 PM  Result Value Ref Range   Lipase 27 11 - 51 U/L    Comment: Performed at [facility], [address] [place], [place] [address]  hCG, quantitative, pregnancy     Status: None   Collection Time: 08/02/23  9:34 PM  Result Value Ref Range   hCG, Beta Chain,  Quant, S 3 <5 mIU/mL    Comment:          GEST. AGE      CONC.  (mIU/mL)   <=1 WEEK        5 - 50     2 WEEKS       50 - 500     3 WEEKS       100 - 10,000     4 WEEKS     1,000 - 30,000     5 WEEKS     3,500 - 115,000   6-8 WEEKS     12,000 - 270,000    12 WEEKS     15,000 - 220,000        FEMALE AND NON-PREGNANT FEMALE:     LESS THAN 5 mIU/mL Performed at [facility], [address] [place], [place] [address]   Lactic acid, plasma     Status: None   Collection Time: 08/02/23 10:06 PM  Result Value Ref Range   Lactic Acid, Venous 1.8 0.5 - 1.9 mmol/L    Comment: Performed at [facility], [address] [place], [place] [address]  I-Stat venous blood gas, ([facility] ED, [facility], [facility])     Status: Abnormal   Collection Time: 08/02/23 10:48 PM  Result Value Ref Range   pH, Ven 7.426 7.25 - 7.43   pCO2, Ven 48.6 44 - 60 mmHg   pO2, Ven 32 32 - 45 mmHg   Bicarbonate 32.0 (H) 20.0 - 28.0 mmol/L   TCO2 33 (H) 22 - 32 mmol/L   O2 Saturation 63 %   Acid-Base Excess 6.0 (H) 0.0 - 2.0 mmol/L   Sodium 137 135 - 145 mmol/L   Potassium 4.5 3.5 - 5.1 mmol/L   Calcium, Ion 1.05 (L) 1.15 - 1.40 mmol/L   HCT 42.0 36.0 - 46.0 %   Hemoglobin 14.3 12.0 - 15.0 g/dL   Patient temperature 32.4 F    Sample type VENOUS    Comment NOTIFIED PHYSICIAN   CBG monitoring, ED     Status: Abnormal   Collection Time: 08/03/23 10:55 AM  Result Value Ref Range   Glucose-Capillary 340 (H) 70 - 99 mg/dL    Comment: Glucose reference range applies only to samples taken after fasting for at least 8 hours.  I-Stat venous blood gas, ([facility], [facility], [facility])     Status: Abnormal   Collection Time:  08/03/23 11:35 AM  Result Value Ref Range   pH, Ven 7.387 7.25 - 7.43   pCO2, Ven 47.7 44 - 60 mmHg   pO2, Ven 73 (H) 32 - 45 mmHg   Bicarbonate 28.8 (H) 20.0 - 28.0 mmol/L   TCO2 30 22 - 32 mmol/L   O2 Saturation 95 %   Acid-Base Excess 3.0 (H) 0.0 - 2.0 mmol/L   Sodium 135 135 - 145 mmol/L   Potassium 6.2 (H) 3.5 - 5.1 mmol/L   Calcium, Ion 1.12 (L) 1.15 - 1.40 mmol/L   HCT 41.0 36.0 - 46.0 %   Hemoglobin 13.9 12.0 - 15.0 g/dL   Patient temperature 40.1 F    Sample type VENOUS   CBC with Differential     Status: Abnormal   Collection Time: 08/03/23 12:00 PM  Result Value Ref Range   WBC 6.1 4.0 - 10.5 K/uL   RBC 3.93 3.87 - 5.11 MIL/uL   Hemoglobin 12.7 12.0 - 15.0 g/dL   HCT 02.7 25.3 - 66.4 %  MCV 99.5 80.0 - 100.0 fL   MCH 32.3 26.0 - 34.0 pg   MCHC 32.5 30.0 - 36.0 g/dL   RDW 11.9 14.7 - 82.9 %   Platelets 201 150 - 400 K/uL   nRBC 0.0 0.0 - 0.2 %   Neutrophils Relative % 88 %   Neutro Abs 5.4 1.7 - 7.7 K/uL   Lymphocytes Relative 9 %   Lymphs Abs 0.6 (L) 0.7 - 4.0 K/uL   Monocytes Relative 2 %   Monocytes Absolute 0.1 0.1 - 1.0 K/uL   Eosinophils Relative 0 %   Eosinophils Absolute 0.0 0.0 - 0.5 K/uL   Basophils Relative 1 %   Basophils Absolute 0.0 0.0 - 0.1 K/uL   Immature Granulocytes 0 %   Abs Immature Granulocytes 0.01 0.00 - 0.07 K/uL    Comment: Performed at [facility], [address] [place], [place] [address]  Comprehensive metabolic panel     Status: Abnormal   Collection Time: 08/03/23 12:00 PM  Result Value Ref Range   Sodium 136 135 - 145 mmol/L   Potassium 6.1 (H) 3.5 - 5.1 mmol/L   Chloride 95 (L) 98 - 111 mmol/L   CO2 25 22 - 32 mmol/L   Glucose, Bld 363 (H) 70 - 99 mg/dL    Comment: Glucose reference range applies only to samples taken after fasting for at least 8 hours.   BUN 40 (H) 6 - 20 mg/dL   Creatinine, Ser 0.86 (H) 0.44 - 1.00 mg/dL   Calcium 9.3 8.9 - 57.8 mg/dL   Total Protein 8.1 6.5 - 8.1 g/dL   Albumin 3.9 3.5  - 5.0 g/dL   AST 21 15 - 41 U/L   ALT 15 0 - 44 U/L   Alkaline Phosphatase 130 (H) 38 - 126 U/L   Total Bilirubin 1.3 (H) <1.2 mg/dL   GFR, Estimated 9 (L) >60 mL/min    Comment: (NOTE) Calculated using the CKD-EPI Creatinine Equation (2021)    Anion gap 16 (H) 5 - 15    Comment: Performed at [facility], [address] [place], [place] [address]  CBG monitoring, ED     Status: Abnormal   Collection Time: 08/03/23 12:16 PM  Result Value Ref Range   Glucose-Capillary 349 (H) 70 - 99 mg/dL    Comment: Glucose reference range applies only to samples taken after fasting for at least 8 hours.   Comment 1 Notify RN   CBG monitoring, ED     Status: Abnormal   Collection Time: 08/03/23  1:43 PM  Result Value Ref Range   Glucose-Capillary 285 (H) 70 - 99 mg/dL    Comment: Glucose reference range applies only to samples taken after fasting for at least 8 hours.   DG Abdomen 1 View  Result Date: 08/03/2023 CLINICAL DATA:  Abdominal pain and vomiting. EXAM: ABDOMEN - 1 VIEW COMPARISON:  06/27/2023 FINDINGS: No gaseous small bowel dilatation. Moderate volume stool seen scattered along the length of the colon. No unexpected abdominopelvic calcification. IMPRESSION: Moderate volume colonic stool. No gaseous bowel dilatation to suggest obstruction. Electronically Signed   By: Kennith Center M.D.   On: 08/03/2023 05:11   DG Chest Portable 1 View  Result Date: 08/02/2023 CLINICAL DATA:  Shortness of breath EXAM: PORTABLE CHEST 1 VIEW COMPARISON:  07/18/2023 FINDINGS: Right-sided central venous catheter tip at the right atrium. Cardiomegaly with vascular congestion and diffuse interstitial edema. No pleural effusion or pneumothorax. IMPRESSION: Cardiomegaly with  vascular congestion and mild diffuse interstitial edema Electronically Signed   By: Jasmine Pang M.D.   On: 08/02/2023 22:35    Pending Labs Unresulted Labs (From admission, onward)     Start     Ordered   08/03/23 1059   Beta-hydroxybutyric acid  Once,   URGENT        08/03/23 1058   08/02/23 2113  Urinalysis, Routine w reflex microscopic -Urine, Clean Catch  Once,   URGENT       Question:  Specimen Source  Answer:  Urine, Clean Catch   08/02/23 2112            Vitals/Pain Today's Vitals   08/03/23 1345 08/03/23 1400 08/03/23 1415 08/03/23 1416  BP: (!) 115/93 120/87 (!) 117/94   Pulse: 99 80    Resp: (!) 21 12 16    Temp:      TempSrc:      SpO2: (!) 82% 100%    Weight:      Height:      PainSc:    Asleep    Isolation Precautions No active isolations  Medications Medications  HYDROmorphone (DILAUDID) injection 1 mg (1 mg Intravenous Given 08/03/23 1135)  insulin pump ( Subcutaneous Given 08/03/23 1219)  0.9 %  sodium chloride infusion ( Intravenous Stopped 08/03/23 1217)  sodium zirconium cyclosilicate (LOKELMA) packet 10 g (0 g Oral Hold 08/03/23 1354)  calcium gluconate 1 g/ 50 mL sodium chloride IVPB (1,000 mg Intravenous New Bag/Given 08/03/23 1359)  HYDROmorphone (DILAUDID) injection 0.5 mg (0.5 mg Intravenous Given 08/02/23 2243)  metoCLOPramide (REGLAN) injection 10 mg (10 mg Intravenous Given 08/02/23 2243)  ondansetron (ZOFRAN) injection 4 mg (4 mg Intravenous Given 08/02/23 2327)  sodium chloride 0.9 % bolus 500 mL (0 mLs Intravenous Stopped 08/03/23 0007)  prochlorperazine (COMPAZINE) injection 10 mg (10 mg Intravenous Given 08/03/23 0043)  ondansetron (ZOFRAN) injection 4 mg (4 mg Intravenous Given 08/03/23 0416)  LORazepam (ATIVAN) injection 0.5 mg (0.5 mg Intravenous Given 08/03/23 1134)  magnesium sulfate IVPB 2 g 50 mL (0 g Intravenous Stopped 08/03/23 1157)  labetalol (NORMODYNE) injection 5 mg (5 mg Intravenous Given 08/03/23 1135)  insulin aspart (novoLOG) injection 5 Units (5 Units Intravenous Given 08/03/23 1357)  naloxone (NARCAN) injection 0.4 mg (0.4 mg Intravenous Given 08/03/23 1354)  dextrose 50 % solution 50 mL (50 mLs Intravenous Given 08/03/23 1357)    Mobility Walks at  home but pt on bedrest in ED     Focused Assessments Cardiac Assessment Handoff:  Cardiac Rhythm: Sinus tachycardia Lab Results  Component Value Date   TROPONINI <0.03 12/21/2014   Lab Results  Component Value Date   DDIMER 0.57 (H) 10/21/2022   Does the Patient currently have chest pain? No    R Recommendations: See Admitting Provider Note  Report given to: Linh, RN  Additional Notes:  Pt is somnolent, awakens with verbal stimulation.  Pt resting with eyes closed.  O2 at 2Lper [place]. Monitor notes SR.  BP 117/94. Pox 98%

## 2023-08-03 NOTE — Progress Notes (Addendum)
Pt arrived to rm [facility] from [facility]. Pt appeared sleepy. Awakens with chest rub stimulus and answered questions appropriately. CHG wipe given. Initiated tele. VSS. Call bell within reach. Bed alarm activated. Page [place] admission. Await for MD orders.   Lawson Radar, RN

## 2023-08-03 NOTE — H&P (Signed)
History and Physical   Kathy Lewis GEX:[id number] DOB: December 07, 1995 DOA: 08/02/2023  PCP: Loura Pardon, PA   Patient coming from: Home  Chief Complaint: Abdominal pain, nausea, vomiting  HPI: Kathy Lewis is a 27 y.o. female with medical history significant of hypertension and hyperlipidemia, ESRD on HD, type 1 diabetes, cyclic vomiting, gastroparesis, CHF, bipolar disorder, QT prolongation, DVT, PE presenting with recurrent abdominal pain with associated nausea vomiting.  Patient had onset of abdominal pain yesterday with dialysis.  She also has associated nausea and vomiting.  This is similar to previous episodes for which she has been seen multiple times.  Present to the ED for further evaluation.  Abdominal pain improved in the ED but has had persistent intractable nausea and vomiting.  Denies fever, chills, chest pain, shortness of breath.  Denies constipation, diarrhea.  ED Course: Vital signs in the ED notable for blood pressure in the 100s to 180s systolic, heart rate in the 70s to 120s, respiratory in the teens to 20s.  Requiring 2 to 3 L maintain saturations at times.  Lab workup included CMP with potassium 6.1, chloride 95, BUN 40, creatinine 6.7, glucose 363, alk phos 130.  CBC within normal limits.  VBG with normal pH and normal pCO2.  Beta hydroxybutyric acid mildly elevated at 1.08.  Chest x-ray showed cardiomegaly vascular congestion as well as edema.  Abdominal imaging showed moderate colonic stool burden but no obstruction.  Patient received Dilaudid, labetalol, Ativan, Compazine, Zofran, Reglan, insulin, naloxone, Lokelma was ordered but not given, calcium gluconate, dextrose, magnesium, 500 cc IV fluids at ED.  Review of Systems: As per HPI otherwise all other systems reviewed and are negative.  Past Medical History:  Diagnosis Date   Anemia    Anxiety    Bipolar 2 disorder (HCC)    Chronic kidney disease    Chronic systolic (congestive) heart failure (HCC)     Depression    DKA (diabetic ketoacidoses)    ESRD on peritoneal dialysis (HCC)    HSV infection    on valtrex   Hypokalemia    Leukocytosis    Migraine    Noncompliance with medication regimen    Preeclampsia    Prolonged QT syndrome    Severe anemia    Type 1 diabetes mellitus (HCC)     Past Surgical History:  Procedure Laterality Date   BIOPSY  04/24/2022   Procedure: BIOPSY;  Surgeon: Jenel Lucks, MD;  Location[facility] ENDOSCOPY;  Service: Gastroenterology;;   CARDIAC CATHETERIZATION     COLONOSCOPY WITH PROPOFOL N/A 04/24/2022   Procedure: COLONOSCOPY WITH PROPOFOL;  Surgeon: Jenel Lucks, MD;  Location: [facility] ENDOSCOPY;  Service: Gastroenterology;  Laterality: N/A;   DILATION AND EVACUATION N/A 10/22/2019   Procedure: ULTRASOUND GUIDED DILATATION AND EVACUATION;  Surgeon: Geryl Rankins, MD;  Location: [facility] LD ORS;  Service: Gynecology;  Laterality: N/A;   ESOPHAGOGASTRODUODENOSCOPY (EGD) WITH PROPOFOL N/A 04/24/2022   Procedure: ESOPHAGOGASTRODUODENOSCOPY (EGD) WITH PROPOFOL;  Surgeon: Jenel Lucks, MD;  Location: [facility] ENDOSCOPY;  Service: Gastroenterology;  Laterality: N/A;   IR FLUORO GUIDE CV LINE RIGHT  09/14/2022   IR US GUIDE VASC ACCESS RIGHT  09/14/2022   peritoneal dialysis catheter insertion     RENAL BIOPSY      Social History  reports that she has quit smoking. Her smoking use included cigars and cigarettes. She has a 2 pack-year smoking history. She has never used smokeless tobacco. She reports that she does not drink alcohol and does not  use drugs.  Allergies  Allergen Reactions   Cantaloupe Extract Allergy Skin Test Itching and Other (See Comments)    Mouth itching     Citrullus Vulgaris Itching and Other (See Comments)    Makes mouth itch, ALL melons     Food Itching and Other (See Comments)    All melon - mouth itching   Strawberry Extract Itching and Other (See Comments)    Mouth itching   Nsaids Itching and Other (See Comments)     Avoid per nephrology    Family History  Adopted: Yes  Problem Relation Age of Onset   Heart disease Neg Hx   Reviewed on admission  Prior to Admission medications   Medication Sig Start Date End Date Taking? Authorizing Provider  acetaminophen (TYLENOL) 500 MG tablet Take 1,000 mg by mouth daily as needed for headache (pain).   Yes [provider]  amitriptyline (ELAVIL) 50 MG tablet Take 1 tablet (50 mg total) by mouth at bedtime. 08/30/22  Yes Toy Cookey E, NP  atorvastatin (LIPITOR) 40 MG tablet Take 1 tablet (40 mg total) by mouth every evening. 05/07/23  Yes Bensimhon, Bevelyn Buckles, MD  B Complex-C-Folic Acid (RENA-VITE RX) 1 MG TABS Take 1 tablet by mouth daily. 01/01/23  Yes [provider]  calcium acetate (PHOSLO) 667 MG capsule Take 1,334 mg by mouth 3 (three) times daily with meals. 09/28/22  Yes [provider]  carvedilol (COREG) 25 MG tablet Take 1 tablet (25 mg total) by mouth 2 (two) times daily with a meal. 05/07/23  Yes Bensimhon, Bevelyn Buckles, MD  clonazePAM (KLONOPIN) 0.5 MG tablet Take 1 tablet (0.5 mg total) by mouth 2 (two) times daily as needed for anxiety. Patient taking differently: Take 0.5 mg by mouth at bedtime. 03/16/22  Yes Leroy Sea, MD  dicyclomine (BENTYL) 20 MG tablet Take 1 tablet (20 mg total) by mouth 2 (two) times daily. 07/18/23  Yes Ernie Avena, MD  ELIQUIS 2.5 MG TABS tablet Take 2.5 mg by mouth 2 (two) times daily.   Yes [provider]  famotidine (PEPCID) 20 MG tablet Take 1 tablet (20 mg total) by mouth 2 (two) times daily. 07/11/23  Yes Theresia Lo, Benetta Spar K, DO  hydrALAZINE (APRESOLINE) 100 MG tablet Take 1 tablet (100 mg total) by mouth 2 (two) times daily. 05/07/23  Yes Bensimhon, Bevelyn Buckles, MD  hydrOXYzine (ATARAX) 10 MG tablet Take 10 mg by mouth daily. Also taken before dialysis on Tues, Thurs, Sat.   Yes [provider]  insulin lispro (HUMALOG) 100 UNIT/ML injection Inject 0.5 Units into the  skin See admin instructions. 0.5 units per hour via insulin pump. 01/08/22  Yes [provider]  isosorbide mononitrate (IMDUR) 30 MG 24 hr tablet Take 1 tablet (30 mg total) by mouth daily. 10/09/22 11/20/23 Yes Arrien, York Ram, MD  magnesium oxide (MAG-OX) 400 MG tablet Take 2 tablets (800 mg total) by mouth daily. 05/07/23  Yes Bensimhon, Bevelyn Buckles, MD  methocarbamol (ROBAXIN) 500 MG tablet Take 500 mg by mouth as needed for muscle spasms.   Yes [provider]  omeprazole (PRILOSEC) 20 MG capsule Take 20 mg by mouth daily. 04/11/23  Yes [provider]  sacubitril-valsartan (ENTRESTO) 24-26 MG Take 1 tablet by mouth 2 (two) times daily.   Yes [provider]  SUMAtriptan (IMITREX) 50 MG tablet Take 50 mg by mouth daily as needed for headache.   Yes [provider]  UBRELVY 100 MG TABS Take 100  mg by mouth daily as needed (migraine). 07/09/23  Yes [provider]  valACYclovir (VALTREX) 500 MG tablet Take 500 mg by mouth daily as needed (cold sores). 04/17/22  Yes [provider]  VELPHORO 500 MG chewable tablet Chew 1,000 mg by mouth 3 (three) times daily with meals. 06/21/23  Yes [provider]  Vitamin D, Ergocalciferol, (DRISDOL) 1.25 MG (50000 UNIT) CAPS capsule Take 50,000 Units by mouth every Sunday.   Yes [provider]  senna-docusate (SENOKOT-S) 8.6-50 MG tablet Take 1 tablet by mouth daily. Patient not taking: Reported on 08/03/2023 07/11/23   Elayne Snare K, DO  escitalopram (LEXAPRO) 10 MG tablet Take 20 mg by mouth daily.  05/10/20 10/06/20  [provider]  furosemide (LASIX) 40 MG tablet Take 1 tablet (40 mg total) by mouth daily. Patient not taking: Reported on 02/16/2021 10/28/19 02/17/21  Geryl Rankins, MD  lisinopril (ZESTRIL) 10 MG tablet Take 10 mg by mouth daily.  02/23/20 10/06/20  [provider]  spironolactone (ALDACTONE) 25 MG tablet Take 25 mg by mouth daily.  04/20/20  10/06/20  [provider]    Physical Exam: Vitals:   08/03/23 1345 08/03/23 1400 08/03/23 1415 08/03/23 1537  BP: (!) 115/93 120/87 (!) 117/94 111/82  Pulse: 99 80  77  Resp: (!) 21 12 16 15   Temp:    (!) 97.4 F (36.3 C)  TempSrc:    Oral  SpO2: (!) 82% 100%  100%  Weight:      Height:        Physical Exam Constitutional:      General: She is not in acute distress.    Appearance: Normal appearance.  HENT:     Head: Normocephalic and atraumatic.     Mouth/Throat:     Mouth: Mucous membranes are moist.     Pharynx: Oropharynx is clear.  Eyes:     Extraocular Movements: Extraocular movements intact.     Pupils: Pupils are equal, round, and reactive to light.  Cardiovascular:     Rate and Rhythm: Normal rate and regular rhythm.     Pulses: Normal pulses.     Heart sounds: Normal heart sounds.  Pulmonary:     Effort: Pulmonary effort is normal. No respiratory distress.     Breath sounds: Examination of the right-lower field reveals rales. Examination of the left-lower field reveals rales. Rales present.  Abdominal:     General: Bowel sounds are normal. There is no distension.     Palpations: Abdomen is soft.     Tenderness: There is no abdominal tenderness.  Musculoskeletal:        General: No swelling or deformity.  Skin:    General: Skin is warm and dry.  Neurological:     General: No focal deficit present.     Mental Status: Mental status is at baseline.    Labs on Admission: I have personally reviewed following labs and imaging studies  CBC: Recent Labs  Lab 08/02/23 2134 08/02/23 2248 08/03/23 1135 08/03/23 1200  WBC 5.4  --   --  6.1  NEUTROABS  --   --   --  5.4  HGB 13.2 14.3 13.9 12.7  HCT 40.6 42.0 41.0 39.1  MCV 99.5  --   --  99.5  PLT 227  --   --  201    Basic Metabolic Panel: Recent Labs  Lab 08/02/23 2134 08/02/23 2248 08/03/23 1135 08/03/23 1200  NA 136 137 135 136  K 4.7  4.5 6.2* 6.1*  CL 92*  --   --  95*  CO2 28  --    --  25  GLUCOSE 284*  --   --  363*  BUN 23*  --   --  40*  CREATININE 4.87*  --   --  6.07*  CALCIUM 9.1  --   --  9.3    GFR: Estimated Creatinine Clearance: 10.9 mL/min (A) (by C-G formula based on SCr of 6.07 mg/dL (H)).  Liver Function Tests: Recent Labs  Lab 08/02/23 2134 08/03/23 1200  AST 25 21  ALT 17 15  ALKPHOS 132* 130*  BILITOT 1.3* 1.3*  PROT 8.4* 8.1  ALBUMIN 4.1 3.9    Urine analysis:    Component Value Date/Time   COLORURINE YELLOW 07/13/2023 1004   APPEARANCEUR CLEAR 07/13/2023 1004   LABSPEC 1.013 07/13/2023 1004   PHURINE 7.0 07/13/2023 1004   GLUCOSEU >=500 (A) 07/13/2023 1004   HGBUR NEGATIVE 07/13/2023 1004   BILIRUBINUR NEGATIVE 07/13/2023 1004   KETONESUR 5 (A) 07/13/2023 1004   PROTEINUR >=300 (A) 07/13/2023 1004   UROBILINOGEN 0.2 12/15/2015 1736   NITRITE NEGATIVE 07/13/2023 1004   LEUKOCYTESUR NEGATIVE 07/13/2023 1004    Radiological Exams on Admission: DG Abdomen 1 View  Result Date: 08/03/2023 CLINICAL DATA:  Abdominal pain and vomiting. EXAM: ABDOMEN - 1 VIEW COMPARISON:  06/27/2023 FINDINGS: No gaseous small bowel dilatation. Moderate volume stool seen scattered along the length of the colon. No unexpected abdominopelvic calcification. IMPRESSION: Moderate volume colonic stool. No gaseous bowel dilatation to suggest obstruction. Electronically Signed   By: Kennith Center M.D.   On: 08/03/2023 05:11   DG Chest Portable 1 View  Result Date: 08/02/2023 CLINICAL DATA:  Shortness of breath EXAM: PORTABLE CHEST 1 VIEW COMPARISON:  07/18/2023 FINDINGS: Right-sided central venous catheter tip at the right atrium. Cardiomegaly with vascular congestion and diffuse interstitial edema. No pleural effusion or pneumothorax. IMPRESSION: Cardiomegaly with vascular congestion and mild diffuse interstitial edema Electronically Signed   By: Jasmine Pang M.D.   On: 08/02/2023 22:35    EKG: Independently reviewed.  Sinus tachycardia at 120 bpm.  QTc  borderline at 495.  Nonspecific T wave changes.  Assessment/Plan Principal Problem:   Intractable nausea and vomiting Active Problems:   Essential hypertension   ESRD on dialysis (HCC)   Uncontrolled type 1 diabetes mellitus with hyperglycemia, with long-term current use of insulin (HCC)   Prolonged QT interval   Chronic HFrEF (heart failure with reduced ejection fraction) (HCC)   Dyslipidemia   Bipolar 2 disorder (HCC)   Gastroparesis   Pulmonary embolism (HCC)   Internal jugular vein thrombosis, right (HCC)   Intractable nausea vomiting Abdominal pain History of gastroparesis and cyclic vomiting. > Patient with recurrent abdominal pain with intractable nausea and vomiting.  Has had similar presentation in the past.  History of cyclic vomiting and gastroparesis. > Abdominal pain improved in the ED however has had persistent intractable nausea vomiting. > Did receive Dilaudid, Ativan, Compazine, Zofran, Reglan in the ED. - Monitoring on telemetry overnight - Will recheck EKG and QTc to determine what antiemetic agents are available to her - Wean off as needed Dilaudid as able - Supportive care  Hyperkalemia ESRD on HD > Last dialysis session was yesterday morning.  Noted to have potassium of 6.1 in the ED. > Received insulin, magnesium, calcium gluconate in ED. > Dose of Lokelma was ordered but not given, per chart appears to be due to patient being  drowsy at the time. > EKG without significant QRS prolongation nor peaked T waves. - Consult to nephrology because patient will need dialysis - Monitoring on telemetry - Will give Lokelma dose now - Repeat EKG - Recheck renal function and potassium  Hypertension - Continue home carvedilol, Imdur - Holding hydralazine and Entresto given low normal blood pressure here so far   Hyperlipidemia - Continue home atorvastatin  Type 1 diabetes > Elevated blood sugar in ED in the 200s to 300s.  Some elevation in ketones but normal  bicarb and normal anion gap. - Started on insulin pump and is more alert and oriented now than she was earlier, will continue with home insulin pump  Chronic systolic CHF > Last echo was in October of this year with EF 20-25%, small pericardial effusion. - Continue home Coreg, Imdur - Holding [place] as above - Volume managed with dialysis  Bipolar disorder - Continue home amitriptyline and hydroxyzine  History of DVT and PE - Continue home Eliquis  DVT prophylaxis: Eliquis Code Status:   Full Family Communication:  None on admission  Disposition Plan:   Patient is from:  Home  Anticipated DC to:  Home  Anticipated DC date:  2 days  Anticipated DC barriers: None  Consults called:  None Admission status:  Inpatient, progressive  Severity of Illness: The appropriate patient status for this patient is INPATIENT. Inpatient status is judged to be reasonable and necessary in order to provide the required intensity of service to ensure the patient's safety. The patient's presenting symptoms, physical exam findings, and initial radiographic and laboratory data in the context of their chronic comorbidities is felt to place them at high risk for further clinical deterioration. Furthermore, it is not anticipated that the patient will be medically stable for discharge from the hospital within 2 midnights of admission.   * I certify that at the point of admission it is my clinical judgment that the patient will require inpatient hospital care spanning beyond 2 midnights from the point of admission due to high intensity of service, high risk for further deterioration and high frequency of surveillance required.Synetta Fail MD [facility]  How to contact the [facility] Attending or Consulting provider 7A - 7P or covering provider during after hours 7P -7A, for this patient?   Check the care team in [facility] and look for a) attending/consulting [facility] provider listed and b) the [facility] team  listed Log into [email or web address] and use [place]'s universal password to access. If you do not have the password, please contact the hospital operator. Locate the [facility] provider you are looking for under [facility] and page to a number that you can be directly reached. If you still have difficulty reaching the provider, please page the [facility] (Director on Call) for the Hospitalists listed on amion for assistance.  08/03/2023, 4:51 PM

## 2023-08-03 NOTE — ED Notes (Signed)
Pt is sleepy, somnolent but awakens with verbal stimuli.  [facility] here to transfer patient.

## 2023-08-03 NOTE — ED Provider Notes (Addendum)
Physical Exam  BP (!) 178/143   Pulse (!) 116   Temp 97.6 F (36.4 C) (Oral)   Resp 10   Ht 5' (1.524 m)   Wt 56.2 kg   SpO2 98%   BMI 24.18 kg/m   Physical Exam  Procedures  .Critical Care  Performed by: Alvira Monday, MD Authorized by: Alvira Monday, MD   Critical care provider statement:    Critical care time (minutes):  30   Critical care was time spent personally by me on the following activities:  Development of treatment plan with patient or surrogate, discussions with consultants, evaluation of patient's response to treatment, examination of patient, ordering and review of laboratory studies, ordering and review of radiographic studies, ordering and performing treatments and interventions, pulse oximetry, re-evaluation of patient's condition and review of old charts   ED Course / MDM   Clinical Course as of 08/03/23 1056  Thu Aug 02, 2023  2114 Per chart review admitted on 11/15 for hyperK and abdominal pain.  [TY]  2157 Per chart review patient has had 6 CT abdomens in the past year which have all seemingly been normal per my review. [TY]  2218 Comprehensive metabolic panel(!) Anion gap noted.  Bicarb normal.  Does have elevated glucose.  BUN/creatinine consistent with known ESRD.  No transaminitis to suggest hepatobiliary disease. [TY]  2219 Lipase: 27 Pancreatitis less likely [TY]  2219 CBC No leukocytosis to suggest systemic infection. [TY]  2258 DG Chest Portable 1 View IMPRESSION: Cardiomegaly with vascular congestion and mild diffuse interstitial edema   [TY]  2319 Patient reports improvement of abd pain, but continues to have nausea and vomiting.  [TY]  2351 Patient continues to vomit despite Zofran.  Will admit for intractable nausea vomiting [TY]    Clinical Course User Index [TY] Coral Spikes, DO    Received care of patient from previous providers.  Please see their notes for prior history, physical and care.  Briefly this is a 27 year old  female with a history of ESRD on hemodialysis, chronic heart failure with a reduced ejection fraction, type 1 diabetes, gastroparesis, frequent visits for nausea, vomiting and abdominal pain, who is currently awaiting admission for intractable vomiting.  She is noted to have an anion gap and mild hyperglycemia, but had normal bicarb and pH and labs not consistent with DKA last night. Was noted to have mild diffuse edema on CXR.    Continues to have tachycardia, hypertension, nausea and vomiting this morning.  She was able to sleep last night.  She does have prolonged QTc, and at this time we will order magnesium, Ativan for QTc and nausea vomiting.  I have ordered 5 mg of labetalol for hypertension and tachycardia.  Have reordered labs including CBC, CMP, VBG and beta hydroxybutyric acid.  We are told there are discharges today and hopefully she will be getting a bed in the hospital soon.  On reevaluation, she reports she is feeling much better and she would like to go home.  She has been able to keep down ginger ale.  She is no longer tachycardic, her blood pressures have improved.  She reports he would like to go home.  I explained that we are continuing to wait for the CMP to return, although I am glad that she is okay.  Last night, she had been placed on oxygen after she had received Dilaudid and that was continued to this morning.  This was discontinued, and initially she had oxygen saturations trending from  89 up to 96% on room air.  Plan was to continue observation as he waited for labs.   Labs returned, showing a potassium of 6.1.  Discussed with her that we needed to admit her to the hospital for that, in addition to finding that her oxygen levels had gone down to as low as 84% on room air, and she was again placed on oxygen, and made plan to give insulin, dextrose, calcium, and Lokelma and discussed with nephrology.  After my call with nephrology and before she received her medications, was  called to the room as she had become unresponsive and oxygen saturations were down to 55%.  BVM was placed and before her oxygen saturations recovered she was already responding.  She is responding but continuing to be sleepy, and did give 0.4 mg of Narcan to see if she would respond.  She did not respond to Narcan.  He continues to be sleepy, however awakes to voice, is answering questions appropriately.  She denies feeling any shortness of breath, and she denies chest pain other than soreness that she had experienced from sternal rub.  She additionally denies abdominal pain, nausea, vomiting.  Reports that she just felt sleepy.  It is unclear on telemetry if she had experienced any arrhythmia prior to this episode as there is an area and cannot tell if it is artifact from sternal rub when she became unresponsive or true arrhythmia.  Unclear etiology of this episode of unresponsiveness.  She did not receive any medications prior to this, with last medication being 4 to 5 hours before.  She had been well appearing prior, and after return she denies any shortness of breath.  Discussed initial hyperkalemia with Dr. Ronalee Belts [facility] who recommends hospitalist consult when she arrives.  Episode of unresponsiveness occurred shortly after I hung up the phone. Discussed with Dr. Mal Misty hospitalist.. She is going to progressive which appears to be the appropriate level of care for her.  Suspect she needs dialysis with her hyperkalemia.  She states understanding of plan.     Alvira Monday, MD 08/03/23 1457    Alvira Monday, MD 08/03/23 1505

## 2023-08-03 NOTE — ED Notes (Signed)
Insulin pump settings (0.5 units/hour (total basal 12 units per day), 1 unit per 8 grams carbs, 1 unit drops glucose 50 mg/dl)

## 2023-08-03 NOTE — Consult Note (Addendum)
[facility] Renal Consultation Note    Indication for Consultation:  Management of ESRD/hemodialysis; anemia, hypertension/volume and secondary hyperparathyroidism  VWU:[id number], Marzella Schlein, PA  HPI: Kathy Lewis is a 27 y.o. female with ESRD on HD TTS at [facility]. She has a past medical history significant for prolonged QT syndrome, chronic heart failure with reduced EF, DM type 1, chronic gastroparesis, normocytic anemia, right IJ DVT on Eliquis and bipolar disorder.  Patient was c/o ABD pain but still managed to go to her routine HD treatment on 08/02/23. Apparently, her symptoms worsened with nausea and vomiting prompting her to go to [facility] ED for further evaluation. Patient has had multiple hospitalizations d/t ongoing ABD. ABD CT from 07/18/23 showed no new acute or inflammatory process. Apparently, previous gastric emptying study was negative. It appears she was lethargic when presented to the ED. Noted she became unresponsive for a short period as well. She yelled out after receiving CPR compression X 1 and awakened after receiving Narcan. Her insulin pump is now off. Patient is now transferred here at [facility] for ongoing management of intractable N/V with ABD pain. We were consulted for ongoing dialysis management.   Seen and examined patient at bedside on 4E. She remains very lethargic but arouses to voice and sternal rub. She is unable to answer my questions at this time. She is on 2L O2 and not in respiratory distress. Her last HD was on 08/02/23. Reviewed her outpatient hemodialysis records. Noted she left 2kg over her EDW. Noted she has relatively large gains and has difficulty pulling what she gains. Thus, we've been keeping her EDW low. Notable labs include: K+ 6.1, BUN 40, SrCr 6.07, Hgb 12.7, and lactic acid 1.8. CXR shows vascular congestion and mild diffuse interstital edema. Plan for an extra treatment tonight given current  hyperkalemia.   Past Medical History:  Diagnosis Date   Anemia    Anxiety    Bipolar 2 disorder (HCC)    Chronic kidney disease    Chronic systolic (congestive) heart failure (HCC)    Depression    DKA (diabetic ketoacidoses)    ESRD on peritoneal dialysis (HCC)    HSV infection    on valtrex   Hypokalemia    Leukocytosis    Migraine    Noncompliance with medication regimen    Preeclampsia    Prolonged QT syndrome    Severe anemia    Type 1 diabetes mellitus (HCC)    Past Surgical History:  Procedure Laterality Date   BIOPSY  04/24/2022   Procedure: BIOPSY;  Surgeon: Jenel Lucks, MD;  Location: [facility] ENDOSCOPY;  Service: Gastroenterology;;   CARDIAC CATHETERIZATION     COLONOSCOPY WITH PROPOFOL N/A 04/24/2022   Procedure: COLONOSCOPY WITH PROPOFOL;  Surgeon: Jenel Lucks, MD;  Location: [facility] ENDOSCOPY;  Service: Gastroenterology;  Laterality: N/A;   DILATION AND EVACUATION N/A 10/22/2019   Procedure: ULTRASOUND GUIDED DILATATION AND EVACUATION;  Surgeon: Geryl Rankins, MD;  Location: [facility] LD ORS;  Service: Gynecology;  Laterality: N/A;   ESOPHAGOGASTRODUODENOSCOPY (EGD) WITH PROPOFOL N/A 04/24/2022   Procedure: ESOPHAGOGASTRODUODENOSCOPY (EGD) WITH PROPOFOL;  Surgeon: Jenel Lucks, MD;  Location: [facility] ENDOSCOPY;  Service: Gastroenterology;  Laterality: N/A;   IR FLUORO GUIDE CV LINE RIGHT  09/14/2022   IR US GUIDE VASC ACCESS RIGHT  09/14/2022   peritoneal dialysis catheter insertion     RENAL BIOPSY     Family History  Adopted: Yes  Problem Relation Age of Onset  Heart disease Neg Hx    Social History:  reports that she has quit smoking. Her smoking use included cigars and cigarettes. She has a 2 pack-year smoking history. She has never used smokeless tobacco. She reports that she does not drink alcohol and does not use drugs. Allergies  Allergen Reactions   Cantaloupe Extract Allergy Skin Test Itching and Other (See Comments)    Mouth itching      Citrullus Vulgaris Itching and Other (See Comments)    Makes mouth itch, ALL melons     Food Itching and Other (See Comments)    All melon - mouth itching   Strawberry Extract Itching and Other (See Comments)    Mouth itching   Nsaids Itching and Other (See Comments)    Avoid per nephrology   Prior to Admission medications   Medication Sig Start Date End Date Taking? Authorizing Provider  acetaminophen (TYLENOL) 500 MG tablet Take 1,000 mg by mouth daily as needed for headache (pain).   Yes [provider]  amitriptyline (ELAVIL) 50 MG tablet Take 1 tablet (50 mg total) by mouth at bedtime. 08/30/22  Yes Toy Cookey E, NP  atorvastatin (LIPITOR) 40 MG tablet Take 1 tablet (40 mg total) by mouth every evening. 05/07/23  Yes Bensimhon, Bevelyn Buckles, MD  B Complex-C-Folic Acid (RENA-VITE RX) 1 MG TABS Take 1 tablet by mouth daily. 01/01/23  Yes [provider]  calcium acetate (PHOSLO) 667 MG capsule Take 1,334 mg by mouth 3 (three) times daily with meals. 09/28/22  Yes [provider]  carvedilol (COREG) 25 MG tablet Take 1 tablet (25 mg total) by mouth 2 (two) times daily with a meal. 05/07/23  Yes Bensimhon, Bevelyn Buckles, MD  clonazePAM (KLONOPIN) 0.5 MG tablet Take 1 tablet (0.5 mg total) by mouth 2 (two) times daily as needed for anxiety. Patient taking differently: Take 0.5 mg by mouth at bedtime. 03/16/22  Yes Leroy Sea, MD  dicyclomine (BENTYL) 20 MG tablet Take 1 tablet (20 mg total) by mouth 2 (two) times daily. 07/18/23  Yes Ernie Avena, MD  ELIQUIS 2.5 MG TABS tablet Take 2.5 mg by mouth 2 (two) times daily.   Yes [provider]  famotidine (PEPCID) 20 MG tablet Take 1 tablet (20 mg total) by mouth 2 (two) times daily. 07/11/23  Yes Theresia Lo, Benetta Spar K, DO  hydrALAZINE (APRESOLINE) 100 MG tablet Take 1 tablet (100 mg total) by mouth 2 (two) times daily. 05/07/23  Yes Bensimhon, Bevelyn Buckles, MD  hydrOXYzine (ATARAX) 10 MG tablet Take 10 mg by mouth  daily. Also taken before dialysis on Tues, Thurs, Sat.   Yes [provider]  insulin lispro (HUMALOG) 100 UNIT/ML injection Inject 0.5 Units into the skin See admin instructions. 0.5 units per hour via insulin pump. 01/08/22  Yes [provider]  isosorbide mononitrate (IMDUR) 30 MG 24 hr tablet Take 1 tablet (30 mg total) by mouth daily. 10/09/22 11/20/23 Yes Arrien, York Ram, MD  magnesium oxide (MAG-OX) 400 MG tablet Take 2 tablets (800 mg total) by mouth daily. 05/07/23  Yes Bensimhon, Bevelyn Buckles, MD  methocarbamol (ROBAXIN) 500 MG tablet Take 500 mg by mouth as needed for muscle spasms.   Yes [provider]  omeprazole (PRILOSEC) 20 MG capsule Take 20 mg by mouth daily. 04/11/23  Yes [provider]  sacubitril-valsartan (ENTRESTO) 24-26 MG Take 1 tablet by mouth 2 (two) times daily.   Yes [provider]  SUMAtriptan (IMITREX) 50 MG tablet Take  50 mg by mouth daily as needed for headache.   Yes [provider]  UBRELVY 100 MG TABS Take 100 mg by mouth daily as needed (migraine). 07/09/23  Yes [provider]  valACYclovir (VALTREX) 500 MG tablet Take 500 mg by mouth daily as needed (cold sores). 04/17/22  Yes [provider]  VELPHORO 500 MG chewable tablet Chew 1,000 mg by mouth 3 (three) times daily with meals. 06/21/23  Yes [provider]  Vitamin D, Ergocalciferol, (DRISDOL) 1.25 MG (50000 UNIT) CAPS capsule Take 50,000 Units by mouth every Sunday.   Yes [provider]  senna-docusate (SENOKOT-S) 8.6-50 MG tablet Take 1 tablet by mouth daily. Patient not taking: Reported on 08/03/2023 07/11/23   Elayne Snare K, DO  escitalopram (LEXAPRO) 10 MG tablet Take 20 mg by mouth daily.  05/10/20 10/06/20  [provider]  furosemide (LASIX) 40 MG tablet Take 1 tablet (40 mg total) by mouth daily. Patient not taking: Reported on 02/16/2021 10/28/19 02/17/21  Geryl Rankins, MD  lisinopril (ZESTRIL) 10  MG tablet Take 10 mg by mouth daily.  02/23/20 10/06/20  [provider]  spironolactone (ALDACTONE) 25 MG tablet Take 25 mg by mouth daily.  04/20/20 10/06/20  [provider]   Current Facility-Administered Medications  Medication Dose Route Frequency Provider Last Rate Last Admin   0.9 %  sodium chloride infusion   Intravenous PRN Synetta Fail, MD   Stopped at 08/03/23 1217   acetaminophen (TYLENOL) tablet 650 mg  650 mg Oral Q6H PRN Synetta Fail, MD       Or   acetaminophen (TYLENOL) suppository 650 mg  650 mg Rectal Q6H PRN Synetta Fail, MD       amitriptyline (ELAVIL) tablet 50 mg  50 mg Oral QHS Synetta Fail, MD       apixaban Everlene Balls) tablet 2.5 mg  2.5 mg Oral BID Synetta Fail, MD       atorvastatin (LIPITOR) tablet 40 mg  40 mg Oral QPM Synetta Fail, MD       carvedilol (COREG) tablet 25 mg  25 mg Oral BID WC Synetta Fail, MD       [START ON 08/04/2023] Chlorhexidine Gluconate Cloth 2 % PADS 6 each  6 each Topical Q0600 Berenda Morale, NP       clonazePAM Scarlette Calico) tablet 0.5 mg  0.5 mg Oral Daily PRN Synetta Fail, MD       dextrose 50 % solution            dicyclomine (BENTYL) tablet 20 mg  20 mg Oral BID Synetta Fail, MD       HYDROmorphone (DILAUDID) injection 1 mg  1 mg Intravenous Q4H PRN Synetta Fail, MD       hydrOXYzine (ATARAX) tablet 10 mg  10 mg Oral Daily Synetta Fail, MD       insulin pump   Subcutaneous TID WC, HS, 0200 Synetta Fail, MD   Given at 08/03/23 1649   [START ON 08/04/2023] isosorbide mononitrate (IMDUR) 24 hr tablet 30 mg  30 mg Oral Daily Synetta Fail, MD       naloxone [facility]) injection 0.4 mg  0.4 mg Intravenous PRN Synetta Fail, MD       [START ON 08/04/2023] pantoprazole (PROTONIX) EC tablet 40 mg  40 mg Oral Daily Synetta Fail, MD       polyethylene glycol (MIRALAX / GLYCOLAX) packet 17  g  17 g Oral Daily PRN Synetta Fail,  MD       sacubitril-valsartan (ENTRESTO) 24-26 mg per tablet  1 tablet Oral BID Synetta Fail, MD       sodium chloride flush (NS) 0.9 % injection 3 mL  3 mL Intravenous Q12H Synetta Fail, MD       sodium zirconium cyclosilicate (LOKELMA) packet 10 g  10 g Oral Once Synetta Fail, MD       [START ON 08/04/2023] sucroferric oxyhydroxide (VELPHORO) chewable tablet 1,000 mg  1,000 mg Oral TID WC Synetta Fail, MD       Labs: Basic Metabolic Panel: Recent Labs  Lab 08/02/23 2134 08/02/23 2248 08/03/23 1135 08/03/23 1200  NA 136 137 135 136  K 4.7 4.5 6.2* 6.1*  CL 92*  --   --  95*  CO2 28  --   --  25  GLUCOSE 284*  --   --  363*  BUN 23*  --   --  40*  CREATININE 4.87*  --   --  6.07*  CALCIUM 9.1  --   --  9.3   Liver Function Tests: Recent Labs  Lab 08/02/23 2134 08/03/23 1200  AST 25 21  ALT 17 15  ALKPHOS 132* 130*  BILITOT 1.3* 1.3*  PROT 8.4* 8.1  ALBUMIN 4.1 3.9   Recent Labs  Lab 08/02/23 2134  LIPASE 27   No results for input(s): "AMMONIA" in the last 168 hours. CBC: Recent Labs  Lab 08/02/23 2134 08/02/23 2248 08/03/23 1135 08/03/23 1200  WBC 5.4  --   --  6.1  NEUTROABS  --   --   --  5.4  HGB 13.2 14.3 13.9 12.7  HCT 40.6 42.0 41.0 39.1  MCV 99.5  --   --  99.5  PLT 227  --   --  201   Cardiac Enzymes: No results for input(s): "CKTOTAL", "CKMB", "CKMBINDEX", "TROPONINI" in the last 168 hours. CBG: Recent Labs  Lab 08/02/23 2109 08/03/23 1055 08/03/23 1216 08/03/23 1343 08/03/23 1628  GLUCAP 275* 340* 349* 285* 271*   Iron Studies: No results for input(s): "IRON", "TIBC", "TRANSFERRIN", "FERRITIN" in the last 72 hours. Studies/Results: DG Abdomen 1 View  Result Date: 08/03/2023 CLINICAL DATA:  Abdominal pain and vomiting. EXAM: ABDOMEN - 1 VIEW COMPARISON:  06/27/2023 FINDINGS: No gaseous small bowel dilatation. Moderate volume stool seen scattered along the length of the colon. No unexpected abdominopelvic  calcification. IMPRESSION: Moderate volume colonic stool. No gaseous bowel dilatation to suggest obstruction. Electronically Signed   By: Kennith Center M.D.   On: 08/03/2023 05:11   DG Chest Portable 1 View  Result Date: 08/02/2023 CLINICAL DATA:  Shortness of breath EXAM: PORTABLE CHEST 1 VIEW COMPARISON:  07/18/2023 FINDINGS: Right-sided central venous catheter tip at the right atrium. Cardiomegaly with vascular congestion and diffuse interstitial edema. No pleural effusion or pneumothorax. IMPRESSION: Cardiomegaly with vascular congestion and mild diffuse interstitial edema Electronically Signed   By: Jasmine Pang M.D.   On: 08/02/2023 22:35    ROS: Unable to answer my questions given current lethargy.   Physical Exam: Vitals:   08/03/23 1345 08/03/23 1400 08/03/23 1415 08/03/23 1537  BP: (!) 115/93 120/87 (!) 117/94 111/82  Pulse: 99 80  77  Resp: (!) 21 12 16 15   Temp:    (!) 97.4 F (36.3 C)  TempSrc:    Oral  SpO2: (!) 82% 100%  100%  Weight:  Height:         General: Young woman; lethargic, on O2, NAD Head: Sclera not icteric MMM Lungs: Clear anteriorly. No wheeze, rales or rhonchi. Breathing is unlabored. Heart: RRR. No murmur, rubs or gallops.  Abdomen: soft and non-tender,  Lower extremities: No LE edema  Neuro: Lethargic but arouses to voice and sternal rub Dialysis Access: [facility]  Dialysis Orders:  TTS - [facility] 4hrs, BFR 400, DFR 600,  EDW 54kg, 2K/ 2Ca Heparin 2500 units with HD Hectorol IV qHD - last 12/5   Assessment/Plan: Intractable N/V with ABD pain - managed by Primary ESRD -  on HD TTS, K+ 6.1, plan for extra HD today Hyperkalemia - see above, using 1K bath for 1 hour then switch to 2K bath. Will re-check K+ level in the AM Hypertension/volume  - BP up at admit but now stable. Doesn't appear grossly overloaded. Consistently leaves over EDW. Appears she has difficulty pulling what she gains. UFG set 2L for tonight.  Anemia of CKD  - Hgb 12.7, Fe/ESA not indicated at this time Secondary Hyperparathyroidism -  Ca ok, will check phos in AM Nutrition - Renal diet with fluid restriction  ADDENDUM 08/03/23 at 6:22pm: Informed by HD RN of repeat K+ 5.2. Will place patient on 2K bath for the entire treatment tonight.  Salome Holmes, NP [facility] 08/03/2023, 5:09 PM

## 2023-08-03 NOTE — Inpatient Diabetes Management (Signed)
Inpatient Diabetes Program Recommendations  AACE/ADA: New Consensus Statement on Inpatient Glycemic Control (2015)  Target Ranges:  Prepandial:   less than 140 mg/dL      Peak postprandial:   less than 180 mg/dL (1-2 hours)      Critically ill patients:  140 - 180 mg/dL   Lab Results  Component Value Date   GLUCAP 285 (H) 08/03/2023   HGBA1C 9.0 (H) 06/06/2023    Review of Glycemic Control  Diabetes history: type 1 Outpatient Diabetes medications: insulin pump settings 0.5 units/hour(total basal 12 units per day), 1 units per 8 grams carbs, 1 units drops glucose 50 mg/dl Current orders for Inpatient glycemic control: insulin pump  Inpatient Diabetes Program Recommendations:   Noted that patient has been wearing insulin pump on admission. Noted that patient had a code called at 13:43.  Insulin pump needs to be removed and IV insulin started and continued until stable. SQ insulin regimen to follow once stable.   Smith Mince RN BSN CDE Diabetes Coordinator Pager: [phone]  8am-5pm

## 2023-08-03 NOTE — Progress Notes (Signed)
Performed EKG. Notified MD for long Qtc. Pt asymptomatic. Will continue to monitor .   Lawson Radar, RN

## 2023-08-04 DIAGNOSIS — R112 Nausea with vomiting, unspecified: Secondary | ICD-10-CM | POA: Diagnosis not present

## 2023-08-04 LAB — COMPREHENSIVE METABOLIC PANEL
ALT: 17 U/L (ref 0–44)
AST: 28 U/L (ref 15–41)
Albumin: 3.6 g/dL (ref 3.5–5.0)
Alkaline Phosphatase: 127 U/L — ABNORMAL HIGH (ref 38–126)
Anion gap: 14 (ref 5–15)
BUN: 14 mg/dL (ref 6–20)
CO2: 25 mmol/L (ref 22–32)
Calcium: 9.2 mg/dL (ref 8.9–10.3)
Chloride: 95 mmol/L — ABNORMAL LOW (ref 98–111)
Creatinine, Ser: 3.29 mg/dL — ABNORMAL HIGH (ref 0.44–1.00)
GFR, Estimated: 19 mL/min — ABNORMAL LOW (ref >60)
Glucose, Bld: 116 mg/dL — ABNORMAL HIGH (ref 70–99)
Potassium: 3.3 mmol/L — ABNORMAL LOW (ref 3.5–5.1)
Sodium: 134 mmol/L — ABNORMAL LOW (ref 135–145)
Total Bilirubin: 1.2 mg/dL — ABNORMAL HIGH (ref <1.2)
Total Protein: 7.8 g/dL (ref 6.5–8.1)

## 2023-08-04 LAB — CBC
HCT: 40.5 % (ref 36.0–46.0)
Hemoglobin: 13.4 g/dL (ref 12.0–15.0)
MCH: 32.6 pg (ref 26.0–34.0)
MCHC: 33.1 g/dL (ref 30.0–36.0)
MCV: 98.5 fL (ref 80.0–100.0)
Platelets: 208 10*3/uL (ref 150–400)
RBC: 4.11 MIL/uL (ref 3.87–5.11)
RDW: 14.5 % (ref 11.5–15.5)
WBC: 6.9 10*3/uL (ref 4.0–10.5)
nRBC: 0 % (ref 0.0–0.2)

## 2023-08-04 LAB — GLUCOSE, CAPILLARY
Glucose-Capillary: 247 mg/dL — ABNORMAL HIGH (ref 70–99)
Glucose-Capillary: 198 mg/dL — ABNORMAL HIGH (ref 70–99)
Glucose-Capillary: 150 mg/dL — ABNORMAL HIGH (ref 70–99)
Glucose-Capillary: 106 mg/dL — ABNORMAL HIGH (ref 70–99)
Glucose-Capillary: 210 mg/dL — ABNORMAL HIGH (ref 70–99)

## 2023-08-04 LAB — HEPATITIS B SURFACE ANTIBODY, QUANTITATIVE: Hep B S AB Quant (Post): 847 m[IU]/mL

## 2023-08-04 MED ORDER — TRIMETHOBENZAMIDE HCL 100 MG/ML IM SOLN
200.0000 mg | Freq: Three times a day (TID) | INTRAMUSCULAR | Status: DC | PRN
  Administered 2023-08-05: 200 mg via INTRAMUSCULAR
  Filled 2023-08-04 (×3): qty 2

## 2023-08-04 MED ORDER — INSULIN GLARGINE-YFGN 100 UNIT/ML ~~LOC~~ SOLN
8.0000 [IU] | Freq: Every day | SUBCUTANEOUS | Status: DC
  Administered 2023-08-05 (×2): 8 [IU] via SUBCUTANEOUS
  Filled 2023-08-04 (×3): qty 0.08

## 2023-08-04 MED ORDER — GUAIFENESIN 100 MG/5ML PO LIQD: 5.0000 mL | ORAL | Status: DC | PRN

## 2023-08-04 MED ORDER — TRAZODONE HCL 50 MG PO TABS
50.0000 mg | ORAL_TABLET | Freq: Every evening | ORAL | Status: DC | PRN
  Administered 2023-08-04: 50 mg via ORAL
  Filled 2023-08-04: qty 1

## 2023-08-04 MED ORDER — SENNOSIDES-DOCUSATE SODIUM 8.6-50 MG PO TABS: 1.0000 | ORAL_TABLET | Freq: Every evening | ORAL | Status: DC | PRN

## 2023-08-04 MED ORDER — ONDANSETRON HCL 4 MG/2ML IJ SOLN
4.0000 mg | Freq: Four times a day (QID) | INTRAMUSCULAR | Status: DC | PRN
  Administered 2023-08-04 – 2023-08-05 (×2): 4 mg via INTRAVENOUS
  Filled 2023-08-04 (×2): qty 2

## 2023-08-04 MED ORDER — BISACODYL 5 MG PO TBEC: 10.0000 mg | DELAYED_RELEASE_TABLET | Freq: Every day | ORAL | Status: DC

## 2023-08-04 MED ORDER — IPRATROPIUM-ALBUTEROL 0.5-2.5 (3) MG/3ML IN SOLN: 3.0000 mL | RESPIRATORY_TRACT | Status: DC | PRN

## 2023-08-04 MED ORDER — INSULIN ASPART 100 UNIT/ML IJ SOLN
0.0000 [IU] | Freq: Every day | INTRAMUSCULAR | Status: DC
  Administered 2023-08-04: 2 [IU] via SUBCUTANEOUS
  Administered 2023-08-05: 3 [IU] via SUBCUTANEOUS

## 2023-08-04 MED ORDER — INSULIN ASPART 100 UNIT/ML IJ SOLN
0.0000 [IU] | Freq: Three times a day (TID) | INTRAMUSCULAR | Status: DC
  Administered 2023-08-05: 6 [IU] via SUBCUTANEOUS
  Administered 2023-08-05: 2 [IU] via SUBCUTANEOUS
  Administered 2023-08-06: 5 [IU] via SUBCUTANEOUS
  Administered 2023-08-06: 1 [IU] via SUBCUTANEOUS
  Administered 2023-08-06: 2 [IU] via SUBCUTANEOUS
  Administered 2023-08-07: 4 [IU] via SUBCUTANEOUS

## 2023-08-04 MED ORDER — SODIUM CHLORIDE 0.9 % IV BOLUS
250.0000 mL | Freq: Once | INTRAVENOUS | Status: AC
  Administered 2023-08-04: 250 mL via INTRAVENOUS

## 2023-08-04 MED ORDER — HYDRALAZINE HCL 20 MG/ML IJ SOLN: 10.0000 mg | INTRAMUSCULAR | Status: DC | PRN

## 2023-08-04 MED ORDER — METOPROLOL TARTRATE 5 MG/5ML IV SOLN: 5.0000 mg | INTRAVENOUS | Status: DC | PRN

## 2023-08-04 MED ORDER — MORPHINE SULFATE (PF) 2 MG/ML IV SOLN
2.0000 mg | INTRAVENOUS | Status: DC | PRN
  Administered 2023-08-04 – 2023-08-05 (×2): 2 mg via INTRAVENOUS
  Filled 2023-08-04 (×3): qty 1

## 2023-08-04 NOTE — Plan of Care (Signed)
  Problem: Clinical Measurements: Goal: Will remain free from infection Outcome: Progressing   Problem: Clinical Measurements: Goal: Ability to maintain clinical measurements within normal limits will improve Outcome: Progressing   Problem: Education: Goal: Knowledge of General Education information will improve Description: Including pain rating scale, medication(s)/side effects and non-pharmacologic comfort measures Outcome: Progressing   Problem: Clinical Measurements: Goal: Cardiovascular complication will be avoided Outcome: Progressing   Problem: Activity: Goal: Risk for activity intolerance will decrease Outcome: Progressing

## 2023-08-04 NOTE — Progress Notes (Addendum)
  History of DM type I - Patient's nurse at the bedside reported that patient is stating that her insulin pump has been expired and she has been not using it. -Per chart review patient's A1c 10.810 2024, at home patient is on insulin pump 3 times daily with meal but not of any long-acting insulin regimen.  -As insulin pump has been expired I have discontinued it from patient's chart and started low sliding scale SSI and at bedtime insulin coverage as needed. - Also starting long-acting insulin Semglee 8 units at bedtime. -On discharge patient need to follow-up with her endocrinology to get refill of her insulin pump.   Tereasa Coop, MD [facility] 08/04/2023, 10:27 PM

## 2023-08-04 NOTE — Progress Notes (Signed)
Patient was requesting analgesics for pain 8/10 on her abdomen, so diludid 1mg  IV given for the severe pain, she was alert and oriented.  08/04/23 1343  Vitals  BP Location Left Arm  BP Method Automatic  Patient Position (if appropriate) Lying  Pulse Rate Source Monitor  Resp 18  Level of Consciousness  Level of Consciousness Alert  MEWS COLOR  MEWS Score Color Green  Oxygen Therapy  O2 Device Room Air  Pain Assessment  Pain Scale 0-10  Pain Score 7  Pain Type Acute pain  Pain Location Abdomen  Pain Orientation Mid  Pain Descriptors / Indicators Aching;Cramping  Pain Frequency Intermittent  Pain Onset On-going  Patients Stated Pain Goal 0  Pain Intervention(s) Medication (See eMAR)  Multiple Pain Sites No  MEWS Score  MEWS Temp 0  MEWS Systolic 0  MEWS Pulse 1  MEWS RR 0  MEWS LOC 0  MEWS Score 1

## 2023-08-04 NOTE — Progress Notes (Signed)
Patient was desaturated each time after administering I/V Diludid 1mg , she was alert and oriented *4 so,  put her on oxygen via [place] at 2ltrs for a few minutes then resumed saturation >95% on RA, vomited twice, PRN antiemetics administered, her B/P was high throughout the day, informed to the MD via secure chat, order parameters not met for PRN antihypertensive.

## 2023-08-04 NOTE — Progress Notes (Signed)
PROGRESS NOTE    Kathy Lewis  ZOX:[id number] DOB: 12-16-1995 DOA: 08/02/2023 PCP: Loura Pardon, PA    Brief Narrative:  27 year old with history of HTN, HLD, ESRD on HD, DM 1, cyclical vomiting, gastroparesis, CHF, bipolar disorder, QT prolongation, DVT, PE admitted for recurrent abdominal pain, nausea and vomiting.  Patient started having abdominal pain with dialysis day prior to admission therefore came to the ED.  In the ED x-ray showed moderate stool burden, chest x-ray showed signs of some volume overload.  Admitted to the hospital for electrolyte management.   Assessment & Plan:  Principal Problem:   Intractable nausea and vomiting Active Problems:   Essential hypertension   ESRD on dialysis (HCC)   Uncontrolled type 1 diabetes mellitus with hyperglycemia, with long-term current use of insulin (HCC)   Prolonged QT interval   Chronic HFrEF (heart failure with reduced ejection fraction) (HCC)   Dyslipidemia   Bipolar 2 disorder (HCC)   Gastroparesis   Pulmonary embolism (HCC)   Internal jugular vein thrombosis, right (HCC)     Intractable nausea vomiting Abdominal pain History of gastroparesis and cyclic vomiting. Prolonged QTc This is quite cyclical in nature for her due to her history of gastroparesis.  Recommending at this time antiemetics, monitoring QTc.  Diet as tolerated. QTC nornal this morning, previously tried tigan 200mg  IM, will trial it. Zofran prn 2nd line.    Hyperkalemia ESRD on HD Nephrology team was notified by EDP.  Dialysis per their service which should help manage electrolytes as well   Hypertension Continue home medication, dialysis should help   Hyperlipidemia Statin   Type 1 diabetes On insulin pump.  If unable to manage it here, will order long-acting, sliding scale and Accu-Cheks   Chronic systolic CHF with EF 20% Echocardiogram in May 30, 2023 showed EF of 20%. -Continue Coreg, Imdur, Entresto   Bipolar disorder -  Continue home amitriptyline and hydroxyzine   History of DVT and PE, right IJ - Continue home Eliquis  DVT prophylaxis: Eliquis Code Status: Full code Family Communication:   Status is: Inpatient Remains inpatient appropriate because:     Subjective: Has some nausea this morning no other complaints   Examination:  General exam: Appears calm and comfortable  Respiratory system: Clear to auscultation. Respiratory effort normal. Cardiovascular system: S1 & S2 heard, RRR. No JVD, murmurs, rubs, gallops or clicks. No pedal edema. Gastrointestinal system: Abdomen is nondistended, soft and nontender. No organomegaly or masses felt. Normal bowel sounds heard. Central nervous system: Alert and oriented. No focal neurological deficits. Extremities: Symmetric 5 x 5 power. Skin: No rashes, lesions or ulcers Psychiatry: Judgement and insight appear normal. Mood & affect appropriate.                Diet Orders (From admission, onward)     Start     Ordered   08/03/23 1649  Diet renal/carb modified with fluid restriction Diet-HS Snack? Nothing; Fluid restriction: 1200 mL Fluid; Room service appropriate? Yes; Fluid consistency: Thin  Diet effective now       Question Answer Comment  Diet-HS Snack? Nothing   Fluid restriction: 1200 mL Fluid   Room service appropriate? Yes   Fluid consistency: Thin      08/03/23 1649            Objective: Vitals:   08/04/23 0902 08/04/23 0937 08/04/23 1012 08/04/23 1139  BP: (!) 179/122 (!) 175/131 (!) 171/126 (!) 171/134  Pulse: (!) 110 (!) 109 (!) 111 (!) 107  Resp: 11   17  Temp:    97.7 F (36.5 C)  TempSrc:    Oral  SpO2: 100% 100% 95% 96%  Weight:      Height:        Intake/Output Summary (Last 24 hours) at 08/04/2023 1141 Last data filed at 08/04/2023 0130 Gross per 24 hour  Intake 67.9 ml  Output 2300 ml  Net -2232.1 ml   Filed Weights   08/03/23 1001 08/03/23 2038 08/04/23 0450  Weight: 56.2 kg 56.3 kg 56.3 kg     Scheduled Meds:  amitriptyline  50 mg Oral QHS   apixaban  2.5 mg Oral BID   atorvastatin  40 mg Oral QPM   bisacodyl  10 mg Oral Q1500   carvedilol  25 mg Oral BID WC   Chlorhexidine Gluconate Cloth  6 each Topical Q0600   dicyclomine  20 mg Oral BID   heparin sodium (porcine)  4,000 Units Intracatheter Once   hydrOXYzine  10 mg Oral Daily   insulin pump   Subcutaneous TID WC, HS, 0200   isosorbide mononitrate  30 mg Oral Daily   pantoprazole  40 mg Oral Daily   sacubitril-valsartan  1 tablet Oral BID   sodium chloride flush  3 mL Intravenous Q12H   sucroferric oxyhydroxide  1,000 mg Oral TID WC   Continuous Infusions:  Nutritional status     Body mass index is 24.24 kg/m.  Data Reviewed:   CBC: Recent Labs  Lab 08/02/23 2134 08/02/23 2248 08/03/23 1135 08/03/23 1200 08/04/23 0253  WBC 5.4  --   --  6.1 6.9  NEUTROABS  --   --   --  5.4  --   HGB 13.2 14.3 13.9 12.7 13.4  HCT 40.6 42.0 41.0 39.1 40.5  MCV 99.5  --   --  99.5 98.5  PLT 227  --   --  201 208   Basic Metabolic Panel: Recent Labs  Lab 08/02/23 2134 08/02/23 2248 08/03/23 1135 08/03/23 1200 08/03/23 1635 08/04/23 0253  NA 136 137 135 136 138 134*  K 4.7 4.5 6.2* 6.1* 5.2* 3.3*  CL 92*  --   --  95* 94* 95*  CO2 28  --   --  25 28 25   GLUCOSE 284*  --   --  363* 293* 116*  BUN 23*  --   --  40* 40* 14  CREATININE 4.87*  --   --  6.07* 6.68* 3.29*  CALCIUM 9.1  --   --  9.3 9.4 9.2  PHOS  --   --   --   --  7.4*  --    GFR: Estimated Creatinine Clearance: 20.2 mL/min (A) (by C-G formula based on SCr of 3.29 mg/dL (H)). Liver Function Tests: Recent Labs  Lab 08/02/23 2134 08/03/23 1200 08/03/23 1635 08/04/23 0253  AST 25 21  --  28  ALT 17 15  --  17  ALKPHOS 132* 130*  --  127*  BILITOT 1.3* 1.3*  --  1.2*  PROT 8.4* 8.1  --  7.8  ALBUMIN 4.1 3.9 3.7 3.6   Recent Labs  Lab 08/02/23 2134  LIPASE 27   No results for input(s): "AMMONIA" in the last 168 hours. Coagulation  Profile: No results for input(s): "INR", "PROTIME" in the last 168 hours. Cardiac Enzymes: No results for input(s): "CKTOTAL", "CKMB", "CKMBINDEX", "TROPONINI" in the last 168 hours. BNP (last 3 results) No results for input(s): "PROBNP" in the last 8760  hours. HbA1C: No results for input(s): "HGBA1C" in the last 72 hours. CBG: Recent Labs  Lab 08/03/23 1055 08/03/23 1216 08/03/23 1343 08/03/23 1628 08/04/23 0814  GLUCAP 340* 349* 285* 271* 247*   Lipid Profile: No results for input(s): "CHOL", "HDL", "LDLCALC", "TRIG", "CHOLHDL", "LDLDIRECT" in the last 72 hours. Thyroid Function Tests: No results for input(s): "TSH", "T4TOTAL", "FREET4", "T3FREE", "THYROIDAB" in the last 72 hours. Anemia Panel: No results for input(s): "VITAMINB12", "FOLATE", "FERRITIN", "TIBC", "IRON", "RETICCTPCT" in the last 72 hours. Sepsis Labs: Recent Labs  Lab 08/02/23 2206  LATICACIDVEN 1.8    No results found for this or any previous visit (from the past 240 hour(s)).       Radiology Studies: DG Abdomen 1 View  Result Date: 08/03/2023 CLINICAL DATA:  Abdominal pain and vomiting. EXAM: ABDOMEN - 1 VIEW COMPARISON:  06/27/2023 FINDINGS: No gaseous small bowel dilatation. Moderate volume stool seen scattered along the length of the colon. No unexpected abdominopelvic calcification. IMPRESSION: Moderate volume colonic stool. No gaseous bowel dilatation to suggest obstruction. Electronically Signed   By: Kennith Center M.D.   On: 08/03/2023 05:11   DG Chest Portable 1 View  Result Date: 08/02/2023 CLINICAL DATA:  Shortness of breath EXAM: PORTABLE CHEST 1 VIEW COMPARISON:  07/18/2023 FINDINGS: Right-sided central venous catheter tip at the right atrium. Cardiomegaly with vascular congestion and diffuse interstitial edema. No pleural effusion or pneumothorax. IMPRESSION: Cardiomegaly with vascular congestion and mild diffuse interstitial edema Electronically Signed   By: Jasmine Pang M.D.   On:  08/02/2023 22:35           LOS: 1 day   Time spent= 35 mins    Miguel Rota, MD [facility]  If 7PM-7AM, please contact night-coverage  08/04/2023, 11:41 AM

## 2023-08-04 NOTE — Progress Notes (Signed)
Pt's saturation was only 39% On RA after giving her diludid 1mg  IV, o2 started via [place] at 2ltrs/min, she was alert and oriented, GCS was 15/15, after starting o2 Spo2 increased to 95%.   08/04/23 0826  Vitals  Pulse Rate (!) 108  ECG Heart Rate (!) 105  Resp (!) 28  Level of Consciousness  Level of Consciousness Alert  MEWS COLOR  MEWS Score Color Yellow  Oxygen Therapy  SpO2 (!) 39 %  O2 Device Room Air  MEWS Score  MEWS Temp 0  MEWS Systolic 0  MEWS Pulse 1  MEWS RR 2  MEWS LOC 0  MEWS Score 3

## 2023-08-04 NOTE — Progress Notes (Signed)
   08/04/23 0130  During Treatment Monitoring  Blood Flow Rate (mL/min) 0 mL/min  Arterial Pressure (mmHg) -0.2 mmHg  Venous Pressure (mmHg) -0.61 mmHg  TMP (mmHg) -48.89 mmHg  Ultrafiltration Rate (mL/min) 858 mL/min  Dialysate Flow Rate (mL/min) 0 ml/min  Duration of HD Treatment -hour(s) 3.5 hour(s)  Cumulative Fluid Removed (mL) per Treatment  2300.18  HD Safety Checks Performed Yes  Intra-Hemodialysis Comments Tx completed  Post Treatment  Dialyzer Clearance Lightly streaked  Liters Processed 77.6  Fluid Removed (mL) 2300 mL  Tolerated HD Treatment Yes  Post-Hemodialysis Comments pt stable  Hemodialysis Catheter Right Internal jugular Double lumen Permanent (Tunneled)  Placement Date/Time: 09/14/22 1520   Placed prior to admission: No  Serial / Lot #: [id number]  Expiration Date: 05/31/27  Time Out: Correct patient;Correct site;Correct procedure  Maximum sterile barrier precautions: Hand hygiene;Cap;Mask;Sterile gow...  Site Condition No complications  Blue Lumen Status Heparin locked  Red Lumen Status Heparin locked  Catheter fill solution Heparin 1000 units/ml  Catheter fill volume (Arterial) 2 cc  Catheter fill volume (Venous) 2  Dressing Type Transparent  Dressing Status Clean, Dry, Intact  Drainage Description None  Post treatment catheter status Capped and Clamped   Pt stable post treatment.

## 2023-08-04 NOTE — Progress Notes (Signed)
[facility] Progress Note   Subjective:    Seen and examined patient at bedside. More awake today and now on RA. Tolerated HD overnight with net UF 2.3L. K+ now is 3.3. She reports ongoing ABD pain and N/V.  Objective Vitals:   08/04/23 0828 08/04/23 0902 08/04/23 0937 08/04/23 1012  BP: (!) 161/98 (!) 179/122 (!) 175/131 (!) 171/126  Pulse: (!) 102 (!) 110 (!) 109 (!) 111  Resp: 19 11    Temp:      TempSrc:      SpO2: 100% 100% 100% 95%  Weight:      Height:       Physical Exam General: More awake, on RA, NAD Heart: S1 and S2; No MRGs Lungs:Clear anteriorly Abdomen: Unable to palpate d/t ABD pain Extremities:No LE edema Dialysis Access: [facility]   Filed Weights   08/03/23 1001 08/03/23 2038 08/04/23 0450  Weight: 56.2 kg 56.3 kg 56.3 kg    Intake/Output Summary (Last 24 hours) at 08/04/2023 1140 Last data filed at 08/04/2023 0130 Gross per 24 hour  Intake 67.9 ml  Output 2300 ml  Net -2232.1 ml    Additional Objective Labs: Basic Metabolic Panel: Recent Labs  Lab 08/03/23 1200 08/03/23 1635 08/04/23 0253  NA 136 138 134*  K 6.1* 5.2* 3.3*  CL 95* 94* 95*  CO2 25 28 25   GLUCOSE 363* 293* 116*  BUN 40* 40* 14  CREATININE 6.07* 6.68* 3.29*  CALCIUM 9.3 9.4 9.2  PHOS  --  7.4*  --    Liver Function Tests: Recent Labs  Lab 08/02/23 2134 08/03/23 1200 08/03/23 1635 08/04/23 0253  AST 25 21  --  28  ALT 17 15  --  17  ALKPHOS 132* 130*  --  127*  BILITOT 1.3* 1.3*  --  1.2*  PROT 8.4* 8.1  --  7.8  ALBUMIN 4.1 3.9 3.7 3.6   Recent Labs  Lab 08/02/23 2134  LIPASE 27   CBC: Recent Labs  Lab 08/02/23 2134 08/02/23 2248 08/03/23 1135 08/03/23 1200 08/04/23 0253  WBC 5.4  --   --  6.1 6.9  NEUTROABS  --   --   --  5.4  --   HGB 13.2   < > 13.9 12.7 13.4  HCT 40.6   < > 41.0 39.1 40.5  MCV 99.5  --   --  99.5 98.5  PLT 227  --   --  201 208   < > = values in this interval not displayed.   Blood Culture    Component Value  Date/Time   SDES BLOOD LEFT HAND 10/20/2022 1837   SPECREQUEST  10/20/2022 1837    BOTTLES DRAWN AEROBIC AND ANAEROBIC Blood Culture results may not be optimal due to an inadequate volume of blood received in culture bottles   CULT  10/20/2022 1837    NO GROWTH 6 DAYS Performed at [facility], [address] [place], [place] [address]    REPTSTATUS 10/26/2022 FINAL 10/20/2022 1837    Cardiac Enzymes: No results for input(s): "CKTOTAL", "CKMB", "CKMBINDEX", "TROPONINI" in the last 168 hours. CBG: Recent Labs  Lab 08/03/23 1055 08/03/23 1216 08/03/23 1343 08/03/23 1628 08/04/23 0814  GLUCAP 340* 349* 285* 271* 247*   Iron Studies: No results for input(s): "IRON", "TIBC", "TRANSFERRIN", "FERRITIN" in the last 72 hours. Lab Results  Component Value Date   INR 1.0 10/22/2019   INR 1.0 10/22/2019   Studies/Results: DG Abdomen 1 View  Result  Date: 08/03/2023 CLINICAL DATA:  Abdominal pain and vomiting. EXAM: ABDOMEN - 1 VIEW COMPARISON:  06/27/2023 FINDINGS: No gaseous small bowel dilatation. Moderate volume stool seen scattered along the length of the colon. No unexpected abdominopelvic calcification. IMPRESSION: Moderate volume colonic stool. No gaseous bowel dilatation to suggest obstruction. Electronically Signed   By: Kennith Center M.D.   On: 08/03/2023 05:11   DG Chest Portable 1 View  Result Date: 08/02/2023 CLINICAL DATA:  Shortness of breath EXAM: PORTABLE CHEST 1 VIEW COMPARISON:  07/18/2023 FINDINGS: Right-sided central venous catheter tip at the right atrium. Cardiomegaly with vascular congestion and diffuse interstitial edema. No pleural effusion or pneumothorax. IMPRESSION: Cardiomegaly with vascular congestion and mild diffuse interstitial edema Electronically Signed   By: Jasmine Pang M.D.   On: 08/02/2023 22:35    Medications:   amitriptyline  50 mg Oral QHS   apixaban  2.5 mg Oral BID   atorvastatin  40 mg Oral QPM   bisacodyl  10 mg Oral Q1500    carvedilol  25 mg Oral BID WC   Chlorhexidine Gluconate Cloth  6 each Topical Q0600   dicyclomine  20 mg Oral BID   heparin sodium (porcine)  4,000 Units Intracatheter Once   hydrOXYzine  10 mg Oral Daily   insulin pump   Subcutaneous TID WC, HS, 0200   isosorbide mononitrate  30 mg Oral Daily   pantoprazole  40 mg Oral Daily   sacubitril-valsartan  1 tablet Oral BID   sodium chloride flush  3 mL Intravenous Q12H   sucroferric oxyhydroxide  1,000 mg Oral TID WC    Dialysis Orders: TTS - [facility] 4hrs, BFR 400, DFR 600,  EDW 54kg, 2K/ 2Ca Heparin 2500 units with HD Hectorol IV qHD - last 12/5  Assessment/Plan: Intractable N/V with ABD pain - managed by Primary ESRD -  on HD TTS, K+ now 3.3. Received HD overnight 12/6, unable to dialyze today d/t high patient census and short HD staff. Next HD 12/8. Hyperkalemia - resolved, received Lokelma yesterday pre-HD. K+ now 3.3. Hypertension/volume  - BP up at admit, appears improved with HD, now up again. Not grossly overloaded. Consistently leaves over EDW. Appears she has difficulty pulling what she gains. UFG as tolerated. Resume home BP medications. Anemia of CKD - Hgb 12.7, Fe/ESA not indicated at this time Secondary Hyperparathyroidism -  Ca ok and phos up, on Velphoro 3 tablets with meals. Nutrition - Renal diet with fluid restriction  Salome Holmes, NP [facility] 08/04/2023,11:40 AM  LOS: 1 day

## 2023-08-04 NOTE — Progress Notes (Signed)
Saturation decreased to 84% on RA after giving her IV Diludid 1mg , o2 administered via [place] at 2liters/min and increased to 100%.  08/04/23 1359  Vitals  Pulse Rate 100  ECG Heart Rate (!) 101  Level of Consciousness  Level of Consciousness Alert  MEWS COLOR  MEWS Score Color Green  Oxygen Therapy  SpO2 (!) 84 %  O2 Device Room Air  MEWS Score  MEWS Temp 0  MEWS Systolic 0  MEWS Pulse 1  MEWS RR 0  MEWS LOC 0  MEWS Score 1

## 2023-08-04 NOTE — Progress Notes (Signed)
Patient with pain rating of 5 of 10, moderate pain with only mild and severe PRNs. Contacted physician, orders given will continue to monitor.

## 2023-08-04 NOTE — Progress Notes (Signed)
   08/04/23 0130  During Treatment Monitoring  Blood Flow Rate (mL/min) 0 mL/min  Arterial Pressure (mmHg) -0.2 mmHg  Venous Pressure (mmHg) -0.61 mmHg  TMP (mmHg) -48.89 mmHg  Ultrafiltration Rate (mL/min) 858 mL/min  Dialysate Flow Rate (mL/min) 0 ml/min  Duration of HD Treatment -hour(s) 3.5 hour(s)  Cumulative Fluid Removed (mL) per Treatment  2300.18  HD Safety Checks Performed Yes  Intra-Hemodialysis Comments Tx completed  Post Treatment  Dialyzer Clearance Lightly streaked  Liters Processed 77.6  Fluid Removed (mL) 2300 mL  Tolerated HD Treatment Yes  Post-Hemodialysis Comments pt stable  Hemodialysis Catheter Right Internal jugular Double lumen Permanent (Tunneled)  Placement Date/Time: 09/14/22 1520   Placed prior to admission: No  Serial / Lot #: [id number]  Expiration Date: 05/31/27  Time Out: Correct patient;Correct site;Correct procedure  Maximum sterile barrier precautions: Hand hygiene;Cap;Mask;Sterile gow...  Site Condition No complications  Blue Lumen Status Heparin locked  Red Lumen Status Heparin locked  Catheter fill solution Heparin 1000 units/ml  Catheter fill volume (Arterial) 2 cc  Catheter fill volume (Venous) 2  Dressing Type Transparent  Dressing Status Clean, Dry, Intact  Drainage Description None  Post treatment catheter status Capped and Clamped   Pt stable treatment report to Darryl

## 2023-08-04 NOTE — Progress Notes (Signed)
Informed to the MD about her recent blood pressure. Patient is asymptomatic, alert and oriented.  08/04/23 1754  Vitals  BP (!) 76/48  MAP (mmHg) (!) 58  BP Location Left Arm  BP Method Automatic  Patient Position (if appropriate) Lying  Pulse Rate 86  Pulse Rate Source Monitor  ECG Heart Rate 84  Resp 14  Level of Consciousness  Level of Consciousness Alert  MEWS COLOR  MEWS Score Color Yellow  MEWS Score  MEWS Temp 0  MEWS Systolic 2  MEWS Pulse 0  MEWS RR 0  MEWS LOC 0  MEWS Score 2

## 2023-08-04 NOTE — Progress Notes (Signed)
Patient requested for pain medication via phone, upon checking she was nauseated, and she was so clammy, CBG was 247mg /dl, she was alert and oriented *4, and grimacing with pain. Paged the doctor for I/V PRN anti emetics, I/V dludid 1mg  given for pain.   08/04/23 0801  Vitals  BP (!) 162/113  MAP (mmHg) 126  BP Location Left Arm  BP Method Automatic  Patient Position (if appropriate) Lying  Pulse Rate (!) 114  Pulse Rate Source Monitor  ECG Heart Rate (!) 108  Resp (!) 26  Level of Consciousness  Level of Consciousness Alert  MEWS COLOR  MEWS Score Color Yellow  Oxygen Therapy  SpO2 100 %  O2 Device Room Air  Pain Assessment  Pain Scale 0-10  Pain Score 8  Pain Type Acute pain  Pain Location Abdomen  Pain Orientation Mid  Pain Descriptors / Indicators Sharp  Pain Frequency Intermittent  Pain Onset Gradual  Patients Stated Pain Goal 0  Pain Intervention(s) Medication (See eMAR)  MEWS Score  MEWS Temp 0  MEWS Systolic 0  MEWS Pulse 1  MEWS RR 2  MEWS LOC 0  MEWS Score 3

## 2023-08-04 NOTE — Progress Notes (Signed)
Hypoglycemic Event  CBG: 45  Treatment: 8 oz juice/soda  Symptoms: None  Follow-up CBG: Time:0300 CBG Result:115  Possible Reasons for Event: Inadequate meal intake  Comments/MD notified:no    Anne Ng

## 2023-08-04 NOTE — Hospital Course (Addendum)
Brief Narrative:  27 year old with history of HTN, HLD, ESRD on HD, DM 1, cyclical vomiting, gastroparesis, CHF, bipolar disorder, QT prolongation, DVT, PE admitted for recurrent abdominal pain, nausea and vomiting.  Patient started having abdominal pain with dialysis day prior to admission therefore came to the ED.  In the ED x-ray showed moderate stool burden, chest x-ray showed signs of some volume overload.  Admitted to the hospital for electrolyte management. During the hospitalization her symptoms were managed with antiemetics and pain medication.  She did require several session of dialysis which made her feel better.  She was also diagnosed with UTI due to her symptoms therefore started on IV Rocephin which was later converted to 3 more days of oral Keflex upon discharge.  Patient reported that her insulin pump had expired therefore she received sliding scale and Accu-Cheks in the hospital.  She does have enough supply at home which she can resume.  Lastly due to soft blood pressure her cardiac medications were reduced with instructions to uptitrate outpatient as appropriate.  Assessment & Plan:  Principal Problem:   Intractable nausea and vomiting Active Problems:   Essential hypertension   ESRD on dialysis (HCC)   Uncontrolled type 1 diabetes mellitus with hyperglycemia, with long-term current use of insulin (HCC)   Prolonged QT interval   Chronic HFrEF (heart failure with reduced ejection fraction) (HCC)   Dyslipidemia   Bipolar 2 disorder (HCC)   Gastroparesis   Pulmonary embolism (HCC)   Internal jugular vein thrombosis, right (HCC)     Intractable nausea vomiting, resolved Abdominal pain, resolved History of gastroparesis and cyclic vomiting. Prolonged QTc Very cyclical multiple admissions..  Recommending at this time antiemetics, monitoring QTc.  Diet as tolerated. QTC back to normal, Zofran as needed upon discharge   Hyperkalemia ESRD on HD Nephrology team was notified  by EDP.  Another HD session today prior to discharge  Urinary Burning UA appears to be infected but unable to further determine much given very " chocolatey" color per nursing staff.  On empiric IV Rocephin, transition to p.o. Keflex to complete total 5-day course   Hypertension uncontrolled Continue home medication, dialysis should help   Hyperlipidemia Statin   Type 1 diabetes; uncontrolled with Hyperglycemia.  Resume home insulin pump   Chronic systolic CHF with EF 20% Echocardiogram in May 30, 2023 showed EF of 20%. - Reduce Coreg, Imdur. and hold Entresto new prescriptions given, slowly to restart and uptitrate these medications   Bipolar disorder - Continue home amitriptyline and hydroxyzine   History of DVT and PE, right IJ - Continue home Eliquis  DVT prophylaxis: Eliquis Code Status: Full code Family Communication: Mother and father at bedside Status is: Inpatient Remains inpatient appropriate because: Hopefully we can discharge her later today or tomorrow if symptomatically feeling better   Subjective: Feels great no complaints.  Wishes to go home  Examination:  General exam: Appears calm and comfortable  Respiratory system: Clear to auscultation. Respiratory effort normal. Cardiovascular system: S1 & S2 heard, RRR. No JVD, murmurs, rubs, gallops or clicks. No pedal edema. Gastrointestinal system: Abdomen is nondistended, soft and nontender. No organomegaly or masses felt. Normal bowel sounds heard. Central nervous system: Alert and oriented. No focal neurological deficits. Extremities: Symmetric 5 x 5 power. Skin: No rashes, lesions or ulcers Psychiatry: Judgement and insight appear normal. Mood & affect appropriate.

## 2023-08-04 NOTE — Progress Notes (Signed)
Patient with pain rating of severe at 7 of 10 however patient's blood pressure will not support medication for severe pain. Explained to patient I will treat pain with moderate scale medication as not to drop her BP too low. Will continue to closely monitor.

## 2023-08-05 DIAGNOSIS — R112 Nausea with vomiting, unspecified: Secondary | ICD-10-CM | POA: Diagnosis not present

## 2023-08-05 LAB — CBC
HCT: 40.8 % (ref 36.0–46.0)
Hemoglobin: 13.5 g/dL (ref 12.0–15.0)
MCH: 32.6 pg (ref 26.0–34.0)
MCHC: 33.1 g/dL (ref 30.0–36.0)
MCV: 98.6 fL (ref 80.0–100.0)
Platelets: 210 10*3/uL (ref 150–400)
RBC: 4.14 MIL/uL (ref 3.87–5.11)
RDW: 14.4 % (ref 11.5–15.5)
WBC: 5.6 10*3/uL (ref 4.0–10.5)
nRBC: 0 % (ref 0.0–0.2)

## 2023-08-05 LAB — BASIC METABOLIC PANEL
Anion gap: 16 — ABNORMAL HIGH (ref 5–15)
BUN: 27 mg/dL — ABNORMAL HIGH (ref 6–20)
CO2: 26 mmol/L (ref 22–32)
Calcium: 8.7 mg/dL — ABNORMAL LOW (ref 8.9–10.3)
Chloride: 92 mmol/L — ABNORMAL LOW (ref 98–111)
Creatinine, Ser: 6.32 mg/dL — ABNORMAL HIGH (ref 0.44–1.00)
GFR, Estimated: 9 mL/min — ABNORMAL LOW (ref >60)
Glucose, Bld: 224 mg/dL — ABNORMAL HIGH (ref 70–99)
Potassium: 3.8 mmol/L (ref 3.5–5.1)
Sodium: 134 mmol/L — ABNORMAL LOW (ref 135–145)

## 2023-08-05 LAB — GLUCOSE, CAPILLARY
Glucose-Capillary: 185 mg/dL — ABNORMAL HIGH (ref 70–99)
Glucose-Capillary: 407 mg/dL — ABNORMAL HIGH (ref 70–99)
Glucose-Capillary: 126 mg/dL — ABNORMAL HIGH (ref 70–99)
Glucose-Capillary: 51 mg/dL — ABNORMAL LOW (ref 70–99)
Glucose-Capillary: 70 mg/dL (ref 70–99)
Glucose-Capillary: 241 mg/dL — ABNORMAL HIGH (ref 70–99)
Glucose-Capillary: 284 mg/dL — ABNORMAL HIGH (ref 70–99)

## 2023-08-05 MED ORDER — POLYETHYLENE GLYCOL 3350 17 G PO PACK
17.0000 g | PACK | Freq: Two times a day (BID) | ORAL | Status: DC
  Filled 2023-08-05 (×4): qty 1

## 2023-08-05 MED ORDER — HYDROMORPHONE HCL 1 MG/ML IJ SOLN
0.5000 mg | INTRAMUSCULAR | Status: DC | PRN
  Administered 2023-08-06 – 2023-08-07 (×5): 0.5 mg via INTRAVENOUS
  Filled 2023-08-05 (×5): qty 0.5

## 2023-08-05 MED ORDER — INSULIN ASPART 100 UNIT/ML IJ SOLN
4.0000 [IU] | Freq: Three times a day (TID) | INTRAMUSCULAR | Status: DC
  Administered 2023-08-05 – 2023-08-07 (×5): 4 [IU] via SUBCUTANEOUS

## 2023-08-05 MED ORDER — BISACODYL 5 MG PO TBEC
10.0000 mg | DELAYED_RELEASE_TABLET | Freq: Two times a day (BID) | ORAL | Status: DC
  Administered 2023-08-05 – 2023-08-06 (×2): 10 mg via ORAL
  Filled 2023-08-05 (×5): qty 2

## 2023-08-05 MED ORDER — SODIUM CHLORIDE 0.9 % IV BOLUS
250.0000 mL | INTRAVENOUS | Status: AC
  Administered 2023-08-05: 250 mL via INTRAVENOUS

## 2023-08-05 NOTE — Progress Notes (Signed)
PROGRESS NOTE    Kathy Lewis  ZOX:[id number] DOB: Mar 21, 1996 DOA: 08/02/2023 PCP: Loura Pardon, PA    Brief Narrative:  27 year old with history of HTN, HLD, ESRD on HD, DM 1, cyclical vomiting, gastroparesis, CHF, bipolar disorder, QT prolongation, DVT, PE admitted for recurrent abdominal pain, nausea and vomiting.  Patient started having abdominal pain with dialysis day prior to admission therefore came to the ED.  In the ED x-ray showed moderate stool burden, chest x-ray showed signs of some volume overload.  Admitted to the hospital for electrolyte management.   Assessment & Plan:  Principal Problem:   Intractable nausea and vomiting Active Problems:   Essential hypertension   ESRD on dialysis (HCC)   Uncontrolled type 1 diabetes mellitus with hyperglycemia, with long-term current use of insulin (HCC)   Prolonged QT interval   Chronic HFrEF (heart failure with reduced ejection fraction) (HCC)   Dyslipidemia   Bipolar 2 disorder (HCC)   Gastroparesis   Pulmonary embolism (HCC)   Internal jugular vein thrombosis, right (HCC)     Intractable nausea vomiting Abdominal pain History of gastroparesis and cyclic vomiting. Prolonged QTc This is quite cyclical in nature for her due to her history of gastroparesis.  Recommending at this time antiemetics, monitoring QTc.  Diet as tolerated. QTC nornal this morning, advised nursing staff to give IM Tigan as first-line and then Zofran as needed.   Hyperkalemia ESRD on HD Nephrology team was notified by EDP.  Dialysis per their service which should help manage electrolytes as well  Urinary Burning Check UA and Cultures.    Hypertension uncontrolled Continue home medication, dialysis should help   Hyperlipidemia Statin   Type 1 diabetes; uncontrolled with Hyperglycemia.  Insulin Pump expired.  Ordered ISS, accuchecks, Long acting and premeal. Adjust as needed.  Closely monitor Anion Gap.    Chronic systolic CHF  with EF 20% Echocardiogram in May 30, 2023 showed EF of 20%. -Continue Coreg, Imdur, Entresto   Bipolar disorder - Continue home amitriptyline and hydroxyzine   History of DVT and PE, right IJ - Continue home Eliquis  DVT prophylaxis: Eliquis Code Status: Full code Family Communication:   Status is: Inpatient Remains inpatient appropriate because: Cont hosp stay for nausea vomiting management.    Subjective: Still quite nauseous this morning.  Poor oral intake.   Examination:  General exam: Appears calm and comfortable  Respiratory system: Clear to auscultation. Respiratory effort normal. Cardiovascular system: S1 & S2 heard, RRR. No JVD, murmurs, rubs, gallops or clicks. No pedal edema. Gastrointestinal system: Abdomen is nondistended, soft and nontender. No organomegaly or masses felt. Normal bowel sounds heard. Central nervous system: Alert and oriented. No focal neurological deficits. Extremities: Symmetric 5 x 5 power. Skin: No rashes, lesions or ulcers Psychiatry: Judgement and insight appear normal. Mood & affect appropriate.                Diet Orders (From admission, onward)     Start     Ordered   08/03/23 1649  Diet renal/carb modified with fluid restriction Diet-HS Snack? Nothing; Fluid restriction: 1200 mL Fluid; Room service appropriate? Yes; Fluid consistency: Thin  Diet effective now       Question Answer Comment  Diet-HS Snack? Nothing   Fluid restriction: 1200 mL Fluid   Room service appropriate? Yes   Fluid consistency: Thin      08/03/23 1649            Objective: Vitals:   08/05/23 0759 08/05/23 1028  08/05/23 1113 08/05/23 1157  BP: 111/79 (!) 148/105  (!) 171/128  Pulse: 85 (!) 103 (!) 101 77  Resp: 15   (!) 27  Temp: 98.7 F (37.1 C)  98.4 F (36.9 C)   TempSrc: Oral  Oral   SpO2: 100%  97% 96%  Weight:      Height:        Intake/Output Summary (Last 24 hours) at 08/05/2023 1158 Last data filed at 08/05/2023  0400 Gross per 24 hour  Intake 150 ml  Output 100 ml  Net 50 ml   Filed Weights   08/03/23 2038 08/04/23 0450 08/05/23 0406  Weight: 56.3 kg 56.3 kg 51.9 kg    Scheduled Meds:  amitriptyline  50 mg Oral QHS   apixaban  2.5 mg Oral BID   atorvastatin  40 mg Oral QPM   bisacodyl  10 mg Oral BID   carvedilol  25 mg Oral BID WC   Chlorhexidine Gluconate Cloth  6 each Topical Q0600   dicyclomine  20 mg Oral BID   heparin sodium (porcine)  4,000 Units Intracatheter Once   hydrOXYzine  10 mg Oral Daily   insulin aspart  0-5 Units Subcutaneous QHS   insulin aspart  0-6 Units Subcutaneous TID WC   insulin aspart  4 Units Subcutaneous TID WC   insulin glargine-yfgn  8 Units Subcutaneous QHS   isosorbide mononitrate  30 mg Oral Daily   pantoprazole  40 mg Oral Daily   polyethylene glycol  17 g Oral BID   sacubitril-valsartan  1 tablet Oral BID   sodium chloride flush  3 mL Intravenous Q12H   sucroferric oxyhydroxide  1,000 mg Oral TID WC   Continuous Infusions:  Nutritional status     Body mass index is 22.34 kg/m.  Data Reviewed:   CBC: Recent Labs  Lab 08/02/23 2134 08/02/23 2248 08/03/23 1135 08/03/23 1200 08/04/23 0253 08/05/23 0334  WBC 5.4  --   --  6.1 6.9 5.6  NEUTROABS  --   --   --  5.4  --   --   HGB 13.2 14.3 13.9 12.7 13.4 13.5  HCT 40.6 42.0 41.0 39.1 40.5 40.8  MCV 99.5  --   --  99.5 98.5 98.6  PLT 227  --   --  201 208 210   Basic Metabolic Panel: Recent Labs  Lab 08/02/23 2134 08/02/23 2248 08/03/23 1135 08/03/23 1200 08/03/23 1635 08/04/23 0253 08/05/23 0334  NA 136   < > 135 136 138 134* 134*  K 4.7   < > 6.2* 6.1* 5.2* 3.3* 3.8  CL 92*  --   --  95* 94* 95* 92*  CO2 28  --   --  25 28 25 26   GLUCOSE 284*  --   --  363* 293* 116* 224*  BUN 23*  --   --  40* 40* 14 27*  CREATININE 4.87*  --   --  6.07* 6.68* 3.29* 6.32*  CALCIUM 9.1  --   --  9.3 9.4 9.2 8.7*  PHOS  --   --   --   --  7.4*  --   --    < > = values in this interval  not displayed.   GFR: Estimated Creatinine Clearance: 9.6 mL/min (A) (by C-G formula based on SCr of 6.32 mg/dL (H)). Liver Function Tests: Recent Labs  Lab 08/02/23 2134 08/03/23 1200 08/03/23 1635 08/04/23 0253  AST 25 21  --  28  ALT 17 15  --  17  ALKPHOS 132* 130*  --  127*  BILITOT 1.3* 1.3*  --  1.2*  PROT 8.4* 8.1  --  7.8  ALBUMIN 4.1 3.9 3.7 3.6   Recent Labs  Lab 08/02/23 2134  LIPASE 27   No results for input(s): "AMMONIA" in the last 168 hours. Coagulation Profile: No results for input(s): "INR", "PROTIME" in the last 168 hours. Cardiac Enzymes: No results for input(s): "CKTOTAL", "CKMB", "CKMBINDEX", "TROPONINI" in the last 168 hours. BNP (last 3 results) No results for input(s): "PROBNP" in the last 8760 hours. HbA1C: No results for input(s): "HGBA1C" in the last 72 hours. CBG: Recent Labs  Lab 08/04/23 1918 08/04/23 2209 08/05/23 0204 08/05/23 0658 08/05/23 1114  GLUCAP 106* 210* 185* 407* 126*   Lipid Profile: No results for input(s): "CHOL", "HDL", "LDLCALC", "TRIG", "CHOLHDL", "LDLDIRECT" in the last 72 hours. Thyroid Function Tests: No results for input(s): "TSH", "T4TOTAL", "FREET4", "T3FREE", "THYROIDAB" in the last 72 hours. Anemia Panel: No results for input(s): "VITAMINB12", "FOLATE", "FERRITIN", "TIBC", "IRON", "RETICCTPCT" in the last 72 hours. Sepsis Labs: Recent Labs  Lab 08/02/23 2206  LATICACIDVEN 1.8    No results found for this or any previous visit (from the past 240 hour(s)).       Radiology Studies: No results found.         LOS: 2 days   Time spent= 35 mins    Miguel Rota, MD [facility]  If 7PM-7AM, please contact night-coverage  08/05/2023, 11:58 AM

## 2023-08-05 NOTE — Progress Notes (Signed)
Transient hypotension: Patient's nurse reported that patient blood pressure is of 89/62.  Patient is asymptomatic. Per chart review patient is ESRD on HD TTS schedule and history of CHF EF 20%.  Currently Coreg and Imdur and Entresto has not given in the daytime due to soft blood pressure - Due to persistent low blood pressure recommending to hold the Entresto and Coreg for tonight as well. -Giving NS to 250 mL bolus. - Will monitor for improvement of blood pressure. -Patient is very adamant about pain medications for last 2 nights she has been getting pain medications for abdominal pain.  I have decreased the dose of Dilaudid 1 to 0.5 mg every 4 hours as needed for severe pain.  Tereasa Coop, MD [facility] 08/05/2023, 11:45 PM

## 2023-08-05 NOTE — Progress Notes (Signed)
Pt was sleeping, spo2 was 60% and it was on decreasing trend so put her on oxygyn at 2ltrs via [place]. Spo2 increased to 97% within 2 minutes. Pt was awake and alert while assessing.  08/05/23 1809  Vitals  Pulse Rate 97  ECG Heart Rate 95  Resp 11  MEWS COLOR  MEWS Score Color Yellow  Oxygen Therapy  SpO2 (!) 60 %  O2 Device Room Air  MEWS Score  MEWS Temp 0  MEWS Systolic 1  MEWS Pulse 0  MEWS RR 1  MEWS LOC 0  MEWS Score 2

## 2023-08-05 NOTE — Progress Notes (Signed)
Patient refused to take her laxative and stool softener, requested for pain meds, PRN given as per the order, doesn't have good oral intake, has been drinking only some fluids,  Education provided.

## 2023-08-05 NOTE — Plan of Care (Signed)
  Problem: Clinical Measurements: Goal: Will remain free from infection Outcome: Progressing   Problem: Clinical Measurements: Goal: Ability to maintain clinical measurements within normal limits will improve Outcome: Progressing   Problem: Clinical Measurements: Goal: Diagnostic test results will improve Outcome: Progressing   Problem: Activity: Goal: Risk for activity intolerance will decrease Outcome: Progressing

## 2023-08-05 NOTE — Progress Notes (Signed)
[facility] Progress Note   Subjective:    Seen and examined patient at bedside. She expresses concern of her ongoing ABD issues. On RA and wanting to go home. Discussed with Hospitalist. Next HD 12/10 if she remains inpatient.  Objective Vitals:   08/05/23 0759 08/05/23 1028 08/05/23 1113 08/05/23 1157  BP: 111/79 (!) 148/105  (!) 171/128  Pulse: 85 (!) 103 (!) 101 77  Resp: 15   (!) 27  Temp: 98.7 F (37.1 C)  98.4 F (36.9 C)   TempSrc: Oral  Oral   SpO2: 100%  97% 96%  Weight:      Height:       Physical Exam General: Awake, alert, on RA, NAD Heart: S1 and S2; No MRGs Lungs: Clear anteriorly Abdomen: Unable to palpate given ABD pain Extremities:No LE edema Dialysis Access: [facility]  Filed Weights   08/03/23 2038 08/04/23 0450 08/05/23 0406  Weight: 56.3 kg 56.3 kg 51.9 kg    Intake/Output Summary (Last 24 hours) at 08/05/2023 1317 Last data filed at 08/05/2023 0400 Gross per 24 hour  Intake 150 ml  Output 100 ml  Net 50 ml    Additional Objective Labs: Basic Metabolic Panel: Recent Labs  Lab 08/03/23 1635 08/04/23 0253 08/05/23 0334  NA 138 134* 134*  K 5.2* 3.3* 3.8  CL 94* 95* 92*  CO2 28 25 26   GLUCOSE 293* 116* 224*  BUN 40* 14 27*  CREATININE 6.68* 3.29* 6.32*  CALCIUM 9.4 9.2 8.7*  PHOS 7.4*  --   --    Liver Function Tests: Recent Labs  Lab 08/02/23 2134 08/03/23 1200 08/03/23 1635 08/04/23 0253  AST 25 21  --  28  ALT 17 15  --  17  ALKPHOS 132* 130*  --  127*  BILITOT 1.3* 1.3*  --  1.2*  PROT 8.4* 8.1  --  7.8  ALBUMIN 4.1 3.9 3.7 3.6   Recent Labs  Lab 08/02/23 2134  LIPASE 27   CBC: Recent Labs  Lab 08/02/23 2134 08/02/23 2248 08/03/23 1200 08/04/23 0253 08/05/23 0334  WBC 5.4  --  6.1 6.9 5.6  NEUTROABS  --   --  5.4  --   --   HGB 13.2   < > 12.7 13.4 13.5  HCT 40.6   < > 39.1 40.5 40.8  MCV 99.5  --  99.5 98.5 98.6  PLT 227  --  201 208 210   < > = values in this interval not displayed.   Blood  Culture    Component Value Date/Time   SDES BLOOD LEFT HAND 10/20/2022 1837   SPECREQUEST  10/20/2022 1837    BOTTLES DRAWN AEROBIC AND ANAEROBIC Blood Culture results may not be optimal due to an inadequate volume of blood received in culture bottles   CULT  10/20/2022 1837    NO GROWTH 6 DAYS Performed at [facility], [address], [place], [place] [address]    REPTSTATUS 10/26/2022 FINAL 10/20/2022 1837    Cardiac Enzymes: No results for input(s): "CKTOTAL", "CKMB", "CKMBINDEX", "TROPONINI" in the last 168 hours. CBG: Recent Labs  Lab 08/04/23 1918 08/04/23 2209 08/05/23 0204 08/05/23 0658 08/05/23 1114  GLUCAP 106* 210* 185* 407* 126*   Iron Studies: No results for input(s): "IRON", "TIBC", "TRANSFERRIN", "FERRITIN" in the last 72 hours. Lab Results  Component Value Date   INR 1.0 10/22/2019   INR 1.0 10/22/2019   Studies/Results: No results found.  Medications:   amitriptyline  50 mg Oral QHS   apixaban  2.5 mg Oral BID   atorvastatin  40 mg Oral QPM   bisacodyl  10 mg Oral BID   carvedilol  25 mg Oral BID WC   Chlorhexidine Gluconate Cloth  6 each Topical Q0600   dicyclomine  20 mg Oral BID   heparin sodium (porcine)  4,000 Units Intracatheter Once   hydrOXYzine  10 mg Oral Daily   insulin aspart  0-5 Units Subcutaneous QHS   insulin aspart  0-6 Units Subcutaneous TID WC   insulin aspart  4 Units Subcutaneous TID WC   insulin glargine-yfgn  8 Units Subcutaneous QHS   isosorbide mononitrate  30 mg Oral Daily   pantoprazole  40 mg Oral Daily   polyethylene glycol  17 g Oral BID   sacubitril-valsartan  1 tablet Oral BID   sodium chloride flush  3 mL Intravenous Q12H   sucroferric oxyhydroxide  1,000 mg Oral TID WC    Dialysis Orders: TTS - [facility] 4hrs, BFR 400, DFR 600,  EDW 54kg, 2K/ 2Ca Heparin 2500 units with HD Hectorol IV qHD - last 12/5  Assessment/Plan: Intractable N/V with ABD pain - History of gastroparesis. On  Antiemetics, monitoring QTC. Managed by Primary ESRD -  on HD TTS, K+ now 3.3. Received HD overnight 12/6, unable to dialyze 12/7 d/t high patient census and short nursing staff. Next HD 12/10 unless an emergency.  Hyperkalemia - resolved, received Lokelma 12/6 pre-HD. K+ now 3.8. Hypertension/volume  - BP up at admit, appears improved with HD, now up again. Not grossly overloaded. Consistently leaves over EDW. Appears she has difficulty pulling what she gains. UFG as tolerated. Resume home BP medications. Anemia of CKD - Hgb 13.5, Fe/ESA not indicated at this time Secondary Hyperparathyroidism -  Ca ok and phos up, on Velphoro 3 tablets with meals. Nutrition - Renal diet with fluid restriction  Salome Holmes, NP  [facility] 08/05/2023,1:17 PM  LOS: 2 days

## 2023-08-05 NOTE — Progress Notes (Addendum)
Pt's blood sugar was only 51mg /dl so, sliding scale insulin not given, simple carbs given to the patient, will recheck again.  CBC rechecked, increased to 70mg /dl.  Patient also complaint of hematuria (in few drops of urine), waiting to collect the urine specimen, explained to the patient about needs to collect the sample, - she had 50% of dinner brought by her mother.   - Anti hypertensives not given at 10am and at 5pm, and insulin not given at 5pm informed to the MD via secure chat.

## 2023-08-06 DIAGNOSIS — R112 Nausea with vomiting, unspecified: Secondary | ICD-10-CM | POA: Diagnosis not present

## 2023-08-06 LAB — BASIC METABOLIC PANEL
Anion gap: 18 — ABNORMAL HIGH (ref 5–15)
BUN: 39 mg/dL — ABNORMAL HIGH (ref 6–20)
CO2: 25 mmol/L (ref 22–32)
Calcium: 8.6 mg/dL — ABNORMAL LOW (ref 8.9–10.3)
Chloride: 91 mmol/L — ABNORMAL LOW (ref 98–111)
Creatinine, Ser: 8.47 mg/dL — ABNORMAL HIGH (ref 0.44–1.00)
GFR, Estimated: 6 mL/min — ABNORMAL LOW (ref >60)
Glucose, Bld: 277 mg/dL — ABNORMAL HIGH (ref 70–99)
Potassium: 4.2 mmol/L (ref 3.5–5.1)
Sodium: 134 mmol/L — ABNORMAL LOW (ref 135–145)

## 2023-08-06 LAB — URINALYSIS, ROUTINE W REFLEX MICROSCOPIC

## 2023-08-06 LAB — CBC
HCT: 42.5 % (ref 36.0–46.0)
Hemoglobin: 14 g/dL (ref 12.0–15.0)
MCH: 32.8 pg (ref 26.0–34.0)
MCHC: 32.9 g/dL (ref 30.0–36.0)
MCV: 99.5 fL (ref 80.0–100.0)
Platelets: 214 10*3/uL (ref 150–400)
RBC: 4.27 MIL/uL (ref 3.87–5.11)
RDW: 14 % (ref 11.5–15.5)
WBC: 6.3 10*3/uL (ref 4.0–10.5)
nRBC: 0 % (ref 0.0–0.2)

## 2023-08-06 LAB — URINALYSIS, MICROSCOPIC (REFLEX)
RBC / HPF: >50 RBC/hpf (ref 0–5)
WBC, UA: >50 WBC/hpf (ref 0–5)

## 2023-08-06 LAB — PHOSPHORUS: Phosphorus: 7 mg/dL — ABNORMAL HIGH (ref 2.5–4.6)

## 2023-08-06 LAB — GLUCOSE, CAPILLARY
Glucose-Capillary: 273 mg/dL — ABNORMAL HIGH (ref 70–99)
Glucose-Capillary: 264 mg/dL — ABNORMAL HIGH (ref 70–99)
Glucose-Capillary: 393 mg/dL — ABNORMAL HIGH (ref 70–99)
Glucose-Capillary: 246 mg/dL — ABNORMAL HIGH (ref 70–99)
Glucose-Capillary: 176 mg/dL — ABNORMAL HIGH (ref 70–99)
Glucose-Capillary: 168 mg/dL — ABNORMAL HIGH (ref 70–99)

## 2023-08-06 MED ORDER — OXYCODONE HCL 5 MG PO TABS
5.0000 mg | ORAL_TABLET | ORAL | Status: DC | PRN
  Administered 2023-08-06 – 2023-08-07 (×3): 5 mg via ORAL
  Filled 2023-08-06 (×4): qty 1

## 2023-08-06 MED ORDER — CARVEDILOL 3.125 MG PO TABS
3.1250 mg | ORAL_TABLET | Freq: Two times a day (BID) | ORAL | Status: DC
Start: 2023-08-06 — End: 2023-08-07
  Administered 2023-08-06 – 2023-08-07 (×2): 3.125 mg via ORAL
  Filled 2023-08-06 (×2): qty 1

## 2023-08-06 MED ORDER — CEFTRIAXONE SODIUM 1 G IJ SOLR
1.0000 g | INTRAMUSCULAR | Status: DC
  Administered 2023-08-06: 1 g via INTRAVENOUS
  Filled 2023-08-06 (×2): qty 10

## 2023-08-06 MED ORDER — INSULIN GLARGINE-YFGN 100 UNIT/ML ~~LOC~~ SOLN
12.0000 [IU] | Freq: Every day | SUBCUTANEOUS | Status: DC
  Administered 2023-08-06: 12 [IU] via SUBCUTANEOUS
  Filled 2023-08-06 (×2): qty 0.12

## 2023-08-06 MED ORDER — ISOSORBIDE MONONITRATE ER 30 MG PO TB24
15.0000 mg | ORAL_TABLET | Freq: Every day | ORAL | Status: DC
  Administered 2023-08-07: 15 mg via ORAL
  Filled 2023-08-06 (×2): qty 1

## 2023-08-06 MED ORDER — CHLORHEXIDINE GLUCONATE CLOTH 2 % EX PADS
6.0000 | MEDICATED_PAD | Freq: Every day | CUTANEOUS | Status: DC
  Administered 2023-08-06: 6 via TOPICAL

## 2023-08-06 NOTE — Inpatient Diabetes Management (Addendum)
Inpatient Diabetes Program Recommendations  AACE/ADA: New Consensus Statement on Inpatient Glycemic Control   Target Ranges:  Prepandial:   less than 140 mg/dL      Peak postprandial:   less than 180 mg/dL (1-2 hours)      Critically ill patients:  140 - 180 mg/dL    Latest Reference Range & Units 08/05/23 06:58 08/05/23 11:14 08/05/23 16:16 08/05/23 17:03 08/05/23 18:19 08/05/23 22:01 08/06/23 03:00 08/06/23 06:08  Glucose-Capillary 70 - 99 mg/dL 161 (H)  Novolog 6 units @7 :03 126 (H)  Novolog 4 units (meal cov; did not eat) 51 (L) 70 241 (H)  Novolog 2 units 284 (H)  Novolog 3 units  Semglee 8 units 264 (H) 393 (H)  Novolog 5 units   Review of Glycemic Control  Diabetes history: DM1 Outpatient Diabetes medications: OmniPod insulin pump with Dexcom CGM Current orders for Inpatient glycemic control: Semglee 8 units at bedtime, Novolog 0-6 units TID with meals, Novolog 0-5 units at bedtime, Novolog 4 units TID with meals  Inpatient Diabetes Program Recommendations:    Insulin: Please consider increasing Semglee to 10 units at bedtime.  May need to consider ordering Novolog 3 units at bedtime if patient eats snack of 20 grams or more of carbohydrates.  NOTE: Patient has DM1, uses an insulin pump outpatient for DM control, and well known to inpatient diabetes team due to frequent ED visits and hospital admissions.  Per chart, OmniPod expired on 08/04/23 so SQ insulin regimen was ordered. CBG 126 mg/dl on 09/6 at 04:54 and patient received Novolog 4 units for meal coverage. However, per chart review 0% charted for breakfast and lunch on 12/8. Then glucose down to 51 mg/dl at 09:81 due to getting meal coverage and not eating. CBG this morning 393 mg/dl; question if patient at drank or eating carbohydrates during the night or this morning.  Addendum 08/06/23@12 :00-[name] Patient confirms that she uses OmniPod insulin pump outpatient for DM control. Patient states  she does not have any insulin pump supplies with her at the hospital. Patient states that she would prefer to continue to use SQ insulin regimen while inpatient for DM control. Discussed glucose trends over past 24 hours. Patient states that she ate and drank carbohydrates during the night which likely caused glucose to be 393 mg/dl this morning. Given that patient has Type 1 DM and makes no insulin, when she consumes carbohydrates her glucose is going to increase without appropriate carb coverage insulin.  Discussed that she may need carb coverage for bedtime snack if fasting glucose continues to be consistently elevated. Discussed that basal insulin was increased today. Patient verbalized understanding of information discussed and has no questions at this time.  Thanks, Orlando Penner, RN, MSN, CDCES Diabetes Coordinator Inpatient Diabetes Program [phone] (Team Pager from 8am to 5pm)

## 2023-08-06 NOTE — Progress Notes (Signed)
PROGRESS NOTE    Kathy Lewis  ZOX:[id number] DOB: [birth year] DOA: 08/02/2023 PCP: Loura Pardon, PA    Brief Narrative:  27 year old with history of HTN, HLD, ESRD on HD, DM 1, cyclical vomiting, gastroparesis, CHF, bipolar disorder, QT prolongation, DVT, PE admitted for recurrent abdominal pain, nausea and vomiting.  Patient started having abdominal pain with dialysis day prior to admission therefore came to the ED.  In the ED x-ray showed moderate stool burden, chest x-ray showed signs of some volume overload.  Admitted to the hospital for electrolyte management.   Assessment & Plan:  Principal Problem:   Intractable nausea and vomiting Active Problems:   Essential hypertension   ESRD on dialysis (HCC)   Uncontrolled type 1 diabetes mellitus with hyperglycemia, with long-term current use of insulin (HCC)   Prolonged QT interval   Chronic HFrEF (heart failure with reduced ejection fraction) (HCC)   Dyslipidemia   Bipolar 2 disorder (HCC)   Gastroparesis   Pulmonary embolism (HCC)   Internal jugular vein thrombosis, right (HCC)     Intractable nausea vomiting Abdominal pain History of gastroparesis and cyclic vomiting. Prolonged QTc This is quite cyclical in nature for her due to her history of gastroparesis.  Recommending at this time antiemetics, monitoring QTc.  Diet as tolerated. QTC nornal this morning, advised nursing staff to give IM Tigan as first-line and then Zofran as needed.   Hyperkalemia ESRD on HD Nephrology team was notified by EDP.  Dialysis per their service which should help manage electrolytes as well  Urinary Burning Check UA and Cultures.    Hypertension uncontrolled Continue home medication, dialysis should help   Hyperlipidemia Statin   Type 1 diabetes; uncontrolled with Hyperglycemia.  Insulin Pump expired.  Ordered ISS, accuchecks, Long acting and premeal. Adjust as needed.  Closely monitor Anion Gap.    Chronic systolic CHF  with EF 20% Echocardiogram in May 30, 2023 showed EF of 20%. - Reduce Coreg, Imdur. and hold Entresto   Bipolar disorder - Continue home amitriptyline and hydroxyzine   History of DVT and PE, right IJ - Continue home Eliquis  DVT prophylaxis: Eliquis Code Status: Full code Family Communication: Mother at bedside Status is: Inpatient Remains inpatient appropriate because: Cont hosp stay for nausea vomiting management.  Along with possibility of UTI, hyperglycemia and low blood pressure   Subjective: Nausea slightly better.  Low blood pressure this morning.  Urine appears dark.  Overnight hyperglycemia Overall does feel better  Examination:  General exam: Appears calm and comfortable  Respiratory system: Clear to auscultation. Respiratory effort normal. Cardiovascular system: S1 & S2 heard, RRR. No JVD, murmurs, rubs, gallops or clicks. No pedal edema. Gastrointestinal system: Abdomen is nondistended, soft and nontender. No organomegaly or masses felt. Normal bowel sounds heard. Central nervous system: Alert and oriented. No focal neurological deficits. Extremities: Symmetric 5 x 5 power. Skin: No rashes, lesions or ulcers Psychiatry: Judgement and insight appear normal. Mood & affect appropriate.                Diet Orders (From admission, onward)     Start     Ordered   08/03/23 1649  Diet renal/carb modified with fluid restriction Diet-HS Snack? Nothing; Fluid restriction: 1200 mL Fluid; Room service appropriate? Yes; Fluid consistency: Thin  Diet effective now       Question Answer Comment  Diet-HS Snack? Nothing   Fluid restriction: 1200 mL Fluid   Room service appropriate? Yes   Fluid consistency: Thin  08/03/23 1649            Objective: Vitals:   08/06/23 0414 08/06/23 0415 08/06/23 0500 08/06/23 0800  BP: 92/60   97/69  Pulse: 93   91  Resp: 13 15  14   Temp: 98.5 F (36.9 C)   98.4 F (36.9 C)  TempSrc: Oral   Oral  SpO2: 98%   97%   Weight:   53.2 kg   Height:        Intake/Output Summary (Last 24 hours) at 08/06/2023 1120 Last data filed at 08/06/2023 0300 Gross per 24 hour  Intake 550.16 ml  Output --  Net 550.16 ml   Filed Weights   08/04/23 0450 08/05/23 0406 08/06/23 0500  Weight: 56.3 kg 51.9 kg 53.2 kg    Scheduled Meds:  amitriptyline  50 mg Oral QHS   apixaban  2.5 mg Oral BID   atorvastatin  40 mg Oral QPM   bisacodyl  10 mg Oral BID   carvedilol  3.125 mg Oral BID WC   Chlorhexidine Gluconate Cloth  6 each Topical Q0600   Chlorhexidine Gluconate Cloth  6 each Topical Q0600   dicyclomine  20 mg Oral BID   heparin sodium (porcine)  4,000 Units Intracatheter Once   hydrOXYzine  10 mg Oral Daily   insulin aspart  0-5 Units Subcutaneous QHS   insulin aspart  0-6 Units Subcutaneous TID WC   insulin aspart  4 Units Subcutaneous TID WC   insulin glargine-yfgn  12 Units Subcutaneous QHS   isosorbide mononitrate  15 mg Oral Daily   pantoprazole  40 mg Oral Daily   polyethylene glycol  17 g Oral BID   sodium chloride flush  3 mL Intravenous Q12H   sucroferric oxyhydroxide  1,000 mg Oral TID WC   Continuous Infusions:  Nutritional status     Body mass index is 22.91 kg/m.  Data Reviewed:   CBC: Recent Labs  Lab 08/02/23 2134 08/02/23 2248 08/03/23 1135 08/03/23 1200 08/04/23 0253 08/05/23 0334 08/06/23 0318  WBC 5.4  --   --  6.1 6.9 5.6 6.3  NEUTROABS  --   --   --  5.4  --   --   --   HGB 13.2   < > 13.9 12.7 13.4 13.5 14.0  HCT 40.6   < > 41.0 39.1 40.5 40.8 42.5  MCV 99.5  --   --  99.5 98.5 98.6 99.5  PLT 227  --   --  201 208 210 214   < > = values in this interval not displayed.   Basic Metabolic Panel: Recent Labs  Lab 08/03/23 1200 08/03/23 1635 08/04/23 0253 08/05/23 0334 08/06/23 0318  NA 136 138 134* 134* 134*  K 6.1* 5.2* 3.3* 3.8 4.2  CL 95* 94* 95* 92* 91*  CO2 25 28 25 26 25   GLUCOSE 363* 293* 116* 224* 277*  BUN 40* 40* 14 27* 39*  CREATININE 6.07*  6.68* 3.29* 6.32* 8.47*  CALCIUM 9.3 9.4 9.2 8.7* 8.6*  PHOS  --  7.4*  --   --  7.0*   GFR: Estimated Creatinine Clearance: 7.2 mL/min (A) (by C-G formula based on SCr of 8.47 mg/dL (H)). Liver Function Tests: Recent Labs  Lab 08/02/23 2134 08/03/23 1200 08/03/23 1635 08/04/23 0253  AST 25 21  --  28  ALT 17 15  --  17  ALKPHOS 132* 130*  --  127*  BILITOT 1.3* 1.3*  --  1.2*  PROT 8.4* 8.1  --  7.8  ALBUMIN 4.1 3.9 3.7 3.6   Recent Labs  Lab 08/02/23 2134  LIPASE 27   No results for input(s): "AMMONIA" in the last 168 hours. Coagulation Profile: No results for input(s): "INR", "PROTIME" in the last 168 hours. Cardiac Enzymes: No results for input(s): "CKTOTAL", "CKMB", "CKMBINDEX", "TROPONINI" in the last 168 hours. BNP (last 3 results) No results for input(s): "PROBNP" in the last 8760 hours. HbA1C: No results for input(s): "HGBA1C" in the last 72 hours. CBG: Recent Labs  Lab 08/05/23 1703 08/05/23 1819 08/05/23 2201 08/06/23 0300 08/06/23 0608  GLUCAP 70 241* 284* 264* 393*   Lipid Profile: No results for input(s): "CHOL", "HDL", "LDLCALC", "TRIG", "CHOLHDL", "LDLDIRECT" in the last 72 hours. Thyroid Function Tests: No results for input(s): "TSH", "T4TOTAL", "FREET4", "T3FREE", "THYROIDAB" in the last 72 hours. Anemia Panel: No results for input(s): "VITAMINB12", "FOLATE", "FERRITIN", "TIBC", "IRON", "RETICCTPCT" in the last 72 hours. Sepsis Labs: Recent Labs  Lab 08/02/23 2206  LATICACIDVEN 1.8    No results found for this or any previous visit (from the past 240 hour(s)).       Radiology Studies: No results found.         LOS: 3 days   Time spent= 35 mins    Miguel Rota, MD [facility]  If 7PM-7AM, please contact night-coverage  08/06/2023, 11:20 AM

## 2023-08-06 NOTE — Progress Notes (Signed)
Symptomatic UTI: Patient is complaining about burning sensation during urination. - Urinalysis microscopy from morning showed many bacteria WBC above 50 and RBC.  I have sent UA with reflex urine culture separately. -Given patient is symptomatic, AKI and history of ESRD on HD starting treating with ceftriaxone 1 g daily.  Tereasa Coop, MD [facility] 08/06/2023, 8:26 PM

## 2023-08-06 NOTE — Progress Notes (Signed)
[facility] Progress Note   Subjective:   Overall feeling better, less nausea today. She reports chest is still sore from sternal rub previously. Reports she had blood and burning in her urine this AM, no flank pain, fever, chills. UA already collected. Denies SOB, dizziness.   Objective Vitals:   08/06/23 0414 08/06/23 0415 08/06/23 0500 08/06/23 0800  BP: 92/60   97/69  Pulse: 93   91  Resp: 13 15  14   Temp: 98.5 F (36.9 C)   98.4 F (36.9 C)  TempSrc: Oral   Oral  SpO2: 98%   97%  Weight:   53.2 kg   Height:       Physical Exam General: Alert female in NAD Heart: RRR, no murmurs, rubs or gallops Lungs: CTA bilaterally, respirations unlabored on RA Abdomen: Soft, non-distended, +BS Extremities: No edema b/l lower extremities Dialysis Access:  [facility]  Additional Objective Labs: Basic Metabolic Panel: Recent Labs  Lab 08/03/23 1635 08/04/23 0253 08/05/23 0334 08/06/23 0318  NA 138 134* 134* 134*  K 5.2* 3.3* 3.8 4.2  CL 94* 95* 92* 91*  CO2 28 25 26 25   GLUCOSE 293* 116* 224* 277*  BUN 40* 14 27* 39*  CREATININE 6.68* 3.29* 6.32* 8.47*  CALCIUM 9.4 9.2 8.7* 8.6*  PHOS 7.4*  --   --  7.0*   Liver Function Tests: Recent Labs  Lab 08/02/23 2134 08/03/23 1200 08/03/23 1635 08/04/23 0253  AST 25 21  --  28  ALT 17 15  --  17  ALKPHOS 132* 130*  --  127*  BILITOT 1.3* 1.3*  --  1.2*  PROT 8.4* 8.1  --  7.8  ALBUMIN 4.1 3.9 3.7 3.6   Recent Labs  Lab 08/02/23 2134  LIPASE 27   CBC: Recent Labs  Lab 08/02/23 2134 08/02/23 2248 08/03/23 1200 08/04/23 0253 08/05/23 0334 08/06/23 0318  WBC 5.4  --  6.1 6.9 5.6 6.3  NEUTROABS  --   --  5.4  --   --   --   HGB 13.2   < > 12.7 13.4 13.5 14.0  HCT 40.6   < > 39.1 40.5 40.8 42.5  MCV 99.5  --  99.5 98.5 98.6 99.5  PLT 227  --  201 208 210 214   < > = values in this interval not displayed.   Blood Culture    Component Value Date/Time   SDES BLOOD LEFT HAND 10/20/2022 1837    SPECREQUEST  10/20/2022 1837    BOTTLES DRAWN AEROBIC AND ANAEROBIC Blood Culture results may not be optimal due to an inadequate volume of blood received in culture bottles   CULT  10/20/2022 1837    NO GROWTH 6 DAYS Performed at [facility], [address], [place], [place] [address]    REPTSTATUS 10/26/2022 FINAL 10/20/2022 1837    Cardiac Enzymes: No results for input(s): "CKTOTAL", "CKMB", "CKMBINDEX", "TROPONINI" in the last 168 hours. CBG: Recent Labs  Lab 08/05/23 1703 08/05/23 1819 08/05/23 2201 08/06/23 0300 08/06/23 0608  GLUCAP 70 241* 284* 264* 393*   Iron Studies: No results for input(s): "IRON", "TIBC", "TRANSFERRIN", "FERRITIN" in the last 72 hours. @lablastinr3 @ Studies/Results: No results found. Medications:   amitriptyline  50 mg Oral QHS   apixaban  2.5 mg Oral BID   atorvastatin  40 mg Oral QPM   bisacodyl  10 mg Oral BID   carvedilol  3.125 mg Oral BID WC   Chlorhexidine Gluconate Cloth  6 each Topical Q0600   dicyclomine  20 mg Oral BID   heparin sodium (porcine)  4,000 Units Intracatheter Once   hydrOXYzine  10 mg Oral Daily   insulin aspart  0-5 Units Subcutaneous QHS   insulin aspart  0-6 Units Subcutaneous TID WC   insulin aspart  4 Units Subcutaneous TID WC   insulin glargine-yfgn  12 Units Subcutaneous QHS   isosorbide mononitrate  15 mg Oral Daily   pantoprazole  40 mg Oral Daily   polyethylene glycol  17 g Oral BID   sodium chloride flush  3 mL Intravenous Q12H   sucroferric oxyhydroxide  1,000 mg Oral TID WC    Dialysis Orders: TTS - [facility] 4hrs, BFR 400, DFR 600,  EDW 54kg, 2K/ 2Ca Heparin 2500 units with HD Hectorol IV qHD - last 12/5  Assessment/Plan: Intractable N/V with ABD pain - History of gastroparesis. On Antiemetics, monitoring QTC. Managed by Primary. Improved per pt.  ESRD -  on HD TTS.  Received HD overnight 12/6, unable to dialyze 12/7 d/t high patient census and short nursing staff. Next  HD tomorrow per regular schedule.  Hyperkalemia - resolved, received Lokelma 12/6 pre-HD.  Hypertension/volume  - BP up at admit, now soft. BP meds on hold. Has been getting pain meds which may be the cause. Not grossly overloaded, below EDW. UF with HD as tolerated.  Anemia of CKD - Hgb 14.0,  Fe/ESA not indicated at this time Secondary Hyperparathyroidism -  Ca controlled, continue VDRA. Phos elevated, on Velphoro 3 tablets with meals. Nutrition - Renal diet with fluid restriction   Hematuria- UA and culture pending. No fever, flank pain, chills.   Rogers Blocker, PA-C 08/06/2023, 10:03 AM  [facility] Pager: [phone]

## 2023-08-06 NOTE — Care Management Important Message (Signed)
Important Message  Patient Details  Name: Kathy Lewis MRN: [id number] Date of Birth: 03/17/1996   Important Message Given:  Yes - Medicare IM     Sherilyn Banker 08/06/2023, 4:28 PM

## 2023-08-06 NOTE — Plan of Care (Signed)
  Problem: Education: Goal: Knowledge of General Education information will improve Description: Including pain rating scale, medication(s)/side effects and non-pharmacologic comfort measures Outcome: Progressing   Problem: Health Behavior/Discharge Planning: Goal: Ability to manage health-related needs will improve Outcome: Progressing   Problem: Clinical Measurements: Goal: Will remain free from infection Outcome: Progressing   

## 2023-08-07 ENCOUNTER — Other Ambulatory Visit (HOSPITAL_COMMUNITY): Payer: Self-pay

## 2023-08-07 DIAGNOSIS — R112 Nausea with vomiting, unspecified: Secondary | ICD-10-CM | POA: Diagnosis not present

## 2023-08-07 LAB — BASIC METABOLIC PANEL
Anion gap: 16 — ABNORMAL HIGH (ref 5–15)
BUN: 51 mg/dL — ABNORMAL HIGH (ref 6–20)
CO2: 25 mmol/L (ref 22–32)
Calcium: 8.8 mg/dL — ABNORMAL LOW (ref 8.9–10.3)
Chloride: 92 mmol/L — ABNORMAL LOW (ref 98–111)
Creatinine, Ser: 10.61 mg/dL — ABNORMAL HIGH (ref 0.44–1.00)
GFR, Estimated: 5 mL/min — ABNORMAL LOW (ref >60)
Glucose, Bld: 310 mg/dL — ABNORMAL HIGH (ref 70–99)
Potassium: 5.1 mmol/L (ref 3.5–5.1)
Sodium: 133 mmol/L — ABNORMAL LOW (ref 135–145)

## 2023-08-07 LAB — CBC
HCT: 39.6 % (ref 36.0–46.0)
Hemoglobin: 13.3 g/dL (ref 12.0–15.0)
MCH: 32.8 pg (ref 26.0–34.0)
MCHC: 33.6 g/dL (ref 30.0–36.0)
MCV: 97.5 fL (ref 80.0–100.0)
Platelets: 176 10*3/uL (ref 150–400)
RBC: 4.06 MIL/uL (ref 3.87–5.11)
RDW: 13.9 % (ref 11.5–15.5)
WBC: 6.3 10*3/uL (ref 4.0–10.5)
nRBC: 0 % (ref 0.0–0.2)

## 2023-08-07 LAB — GLUCOSE, CAPILLARY
Glucose-Capillary: 45 mg/dL — ABNORMAL LOW (ref 70–99)
Glucose-Capillary: 112 mg/dL — ABNORMAL HIGH (ref 70–99)
Glucose-Capillary: 165 mg/dL — ABNORMAL HIGH (ref 70–99)
Glucose-Capillary: 350 mg/dL — ABNORMAL HIGH (ref 70–99)
Glucose-Capillary: 144 mg/dL — ABNORMAL HIGH (ref 70–99)

## 2023-08-07 MED ORDER — ALUM & MAG HYDROXIDE-SIMETH 200-200-20 MG/5ML PO SUSP
30.0000 mL | ORAL | Status: DC | PRN
  Administered 2023-08-07: 30 mL via ORAL
  Filled 2023-08-07: qty 30

## 2023-08-07 MED ORDER — ONDANSETRON 4 MG PO TBDP
4.0000 mg | ORAL_TABLET | Freq: Three times a day (TID) | ORAL | 0 refills | Status: DC | PRN
  Filled 2023-08-07: qty 30, 10d supply, fill #0

## 2023-08-07 MED ORDER — CEPHALEXIN 500 MG PO CAPS
500.0000 mg | ORAL_CAPSULE | Freq: Two times a day (BID) | ORAL | 0 refills | Status: AC
  Filled 2023-08-07: qty 6, 3d supply, fill #0

## 2023-08-07 MED ORDER — CARVEDILOL 3.125 MG PO TABS
3.1250 mg | ORAL_TABLET | Freq: Two times a day (BID) | ORAL | 0 refills | Status: DC
  Filled 2023-08-07: qty 60, 30d supply, fill #0

## 2023-08-07 MED ORDER — ISOSORBIDE MONONITRATE ER 30 MG PO TB24
15.0000 mg | ORAL_TABLET | Freq: Every day | ORAL | 0 refills | Status: DC
  Filled 2023-08-07: qty 15, 30d supply, fill #0

## 2023-08-07 NOTE — Progress Notes (Signed)
[facility] Progress Note   Subjective:   UA was brown and turbid but many WBC, was started on rocephin last night. She denies SOB, dizziness, nausea. Sternum is still sore. Has not urinated today.   Objective Vitals:   08/07/23 0033 08/07/23 0301 08/07/23 0453 08/07/23 0855  BP: 119/89 (!) 123/91  116/81  Pulse: 96 95  92  Resp: 16 16  10   Temp: 99.2 F (37.3 C) 97.7 F (36.5 C)  98.5 F (36.9 C)  TempSrc: Oral Oral  Oral  SpO2: 97% 95%  96%  Weight:   54.8 kg   Height:       Physical Exam General: Alert female in NAD Heart: RRR, no murmurs, rubs or gallops Lungs: CTA bilaterally, respirations unlabored on RA Abdomen: Soft, non-distended, +BS Extremities: No edema b/l lower extremities Dialysis Access:  [facility]  Additional Objective Labs: Basic Metabolic Panel: Recent Labs  Lab 08/03/23 1635 08/04/23 0253 08/05/23 0334 08/06/23 0318 08/07/23 0312  NA 138   < > 134* 134* 133*  K 5.2*   < > 3.8 4.2 5.1  CL 94*   < > 92* 91* 92*  CO2 28   < > 26 25 25   GLUCOSE 293*   < > 224* 277* 310*  BUN 40*   < > 27* 39* 51*  CREATININE 6.68*   < > 6.32* 8.47* 10.61*  CALCIUM 9.4   < > 8.7* 8.6* 8.8*  PHOS 7.4*  --   --  7.0*  --    < > = values in this interval not displayed.   Liver Function Tests: Recent Labs  Lab 08/02/23 2134 08/03/23 1200 08/03/23 1635 08/04/23 0253  AST 25 21  --  28  ALT 17 15  --  17  ALKPHOS 132* 130*  --  127*  BILITOT 1.3* 1.3*  --  1.2*  PROT 8.4* 8.1  --  7.8  ALBUMIN 4.1 3.9 3.7 3.6   Recent Labs  Lab 08/02/23 2134  LIPASE 27   CBC: Recent Labs  Lab 08/03/23 1200 08/04/23 0253 08/05/23 0334 08/06/23 0318 08/07/23 0312  WBC 6.1 6.9 5.6 6.3 6.3  NEUTROABS 5.4  --   --   --   --   HGB 12.7 13.4 13.5 14.0 13.3  HCT 39.1 40.5 40.8 42.5 39.6  MCV 99.5 98.5 98.6 99.5 97.5  PLT 201 208 210 214 176   Blood Culture    Component Value Date/Time   SDES BLOOD LEFT HAND 10/20/2022 1837   SPECREQUEST  10/20/2022  1837    BOTTLES DRAWN AEROBIC AND ANAEROBIC Blood Culture results may not be optimal due to an inadequate volume of blood received in culture bottles   CULT  10/20/2022 1837    NO GROWTH 6 DAYS Performed at [facility], [address] [place], [place] [address]    REPTSTATUS 10/26/2022 FINAL 10/20/2022 1837    Cardiac Enzymes: No results for input(s): "CKTOTAL", "CKMB", "CKMBINDEX", "TROPONINI" in the last 168 hours. CBG: Recent Labs  Lab 08/06/23 0608 08/06/23 1257 08/06/23 1652 08/06/23 2058 08/07/23 0611  GLUCAP 393* 246* 176* 168* 350*   Iron Studies: No results for input(s): "IRON", "TIBC", "TRANSFERRIN", "FERRITIN" in the last 72 hours. @lablastinr3 @ Studies/Results: No results found. Medications:  cefTRIAXone (ROCEPHIN)  IV 1 g (08/06/23 2330)    amitriptyline  50 mg Oral QHS   apixaban  2.5 mg Oral BID   atorvastatin  40 mg Oral QPM   bisacodyl  10  mg Oral BID   carvedilol  3.125 mg Oral BID WC   Chlorhexidine Gluconate Cloth  6 each Topical Q0600   dicyclomine  20 mg Oral BID   heparin sodium (porcine)  4,000 Units Intracatheter Once   hydrOXYzine  10 mg Oral Daily   insulin aspart  0-5 Units Subcutaneous QHS   insulin aspart  0-6 Units Subcutaneous TID WC   insulin aspart  4 Units Subcutaneous TID WC   insulin glargine-yfgn  12 Units Subcutaneous QHS   isosorbide mononitrate  15 mg Oral Daily   pantoprazole  40 mg Oral Daily   polyethylene glycol  17 g Oral BID   sodium chloride flush  3 mL Intravenous Q12H   sucroferric oxyhydroxide  1,000 mg Oral TID WC    Outpatient Dialysis Orders: TTS - [facility] 4hrs, BFR 400, DFR 600,  EDW 54kg, 2K/ 2Ca Heparin 2500 units with HD Hectorol IV qHD - last 12/5  Assessment/Plan: Intractable N/V with ABD pain - History of gastroparesis. On Antiemetics, monitoring QTC. Managed by Primary. Improved per pt.  ESRD -  on HD TTS.  Received HD overnight 12/6, unable to dialyze 12/7 d/t high  patient census and short nursing staff. Next HD today per regular schedule.  Hyperkalemia - resolved, received Lokelma 12/6 pre-HD.  Hypertension/volume  - BP up at admit, now soft. BP meds on hold. Has been getting pain meds which may be the cause. Not grossly overloaded, below EDW. UF with HD as tolerated.  Anemia of CKD - Hgb above goal,  Fe/ESA not indicated at this time Secondary Hyperparathyroidism -  Ca controlled, continue VDRA. Phos elevated, on Velphoro 3 tablets with meals. Nutrition - Renal diet with fluid restriction   Hematuria- UA suspicious for UTI. On empiric abx per primary team    Rogers Blocker, PA-C 08/07/2023, 11:15 AM   [facility] Pager: [phone]

## 2023-08-07 NOTE — Plan of Care (Signed)
  Problem: Education: Goal: Knowledge of General Education information will improve Description: Including pain rating scale, medication(s)/side effects and non-pharmacologic comfort measures Outcome: Progressing   Problem: Health Behavior/Discharge Planning: Goal: Ability to manage health-related needs will improve Outcome: Progressing   Problem: Activity: Goal: Risk for activity intolerance will decrease Outcome: Progressing   Problem: Nutrition: Goal: Adequate nutrition will be maintained Outcome: Progressing   Problem: Coping: Goal: Level of anxiety will decrease Outcome: Progressing   Problem: Elimination: Goal: Will not experience complications related to bowel motility Outcome: Not Progressing Goal: Will not experience complications related to urinary retention Outcome: Not Progressing   Problem: Pain Management: Goal: General experience of comfort will improve Outcome: Not Progressing

## 2023-08-07 NOTE — Discharge Summary (Signed)
Physician Discharge Summary  Kathy Lewis ZOX:[id number] DOB: [birth year] DOA: 08/02/2023  PCP: Loura Pardon, PA  Admit date: 08/02/2023 Discharge date: 08/07/2023  Admitted From: Home Disposition: Home  Recommendations for Outpatient Follow-up:  Follow up with PCP in 1-2 weeks Please obtain BMP/CBC in one week your next doctors visit.  Imdur and Coreg has been reduced.  Discontinue Entresto until outpatient follow-up with cardiology.  These medications can be uptitrated slowly 3 more days of oral cortrak to complete total 5-day course Resume home insulin pump.  Should follow-up outpatient endocrinology   Discharge Condition: Stable CODE STATUS: Full code Diet recommendation: Diabetic  Brief/Interim Summary: Brief Narrative:  27 year old with history of HTN, HLD, ESRD on HD, DM 1, cyclical vomiting, gastroparesis, CHF, bipolar disorder, QT prolongation, DVT, PE admitted for recurrent abdominal pain, nausea and vomiting.  Patient started having abdominal pain with dialysis day prior to admission therefore came to the ED.  In the ED x-ray showed moderate stool burden, chest x-ray showed signs of some volume overload.  Admitted to the hospital for electrolyte management. During the hospitalization her symptoms were managed with antiemetics and pain medication.  She did require several session of dialysis which made her feel better.  She was also diagnosed with UTI due to her symptoms therefore started on IV Rocephin which was later converted to 3 more days of oral Keflex upon discharge.  Patient reported that her insulin pump had expired therefore she received sliding scale and Accu-Cheks in the hospital.  She does have enough supply at home which she can resume.  Lastly due to soft blood pressure her cardiac medications were reduced with instructions to uptitrate outpatient as appropriate.  Assessment & Plan:  Principal Problem:   Intractable nausea and vomiting Active Problems:    Essential hypertension   ESRD on dialysis (HCC)   Uncontrolled type 1 diabetes mellitus with hyperglycemia, with long-term current use of insulin (HCC)   Prolonged QT interval   Chronic HFrEF (heart failure with reduced ejection fraction) (HCC)   Dyslipidemia   Bipolar 2 disorder (HCC)   Gastroparesis   Pulmonary embolism (HCC)   Internal jugular vein thrombosis, right (HCC)     Intractable nausea vomiting, resolved Abdominal pain, resolved History of gastroparesis and cyclic vomiting. Prolonged QTc Very cyclical multiple admissions..  Recommending at this time antiemetics, monitoring QTc.  Diet as tolerated. QTC back to normal, Zofran as needed upon discharge   Hyperkalemia ESRD on HD Nephrology team was notified by EDP.  Another HD session today prior to discharge  Urinary Burning UA appears to be infected but unable to further determine much given very " chocolatey" color per nursing staff.  On empiric IV Rocephin, transition to p.o. Keflex to complete total 5-day course   Hypertension uncontrolled Continue home medication, dialysis should help   Hyperlipidemia Statin   Type 1 diabetes; uncontrolled with Hyperglycemia.  Resume home insulin pump   Chronic systolic CHF with EF 20% Echocardiogram in May 30, 2023 showed EF of 20%. - Reduce Coreg, Imdur. and hold Entresto new prescriptions given, slowly to restart and uptitrate these medications   Bipolar disorder - Continue home amitriptyline and hydroxyzine   History of DVT and PE, right IJ - Continue home Eliquis  DVT prophylaxis: Eliquis Code Status: Full code Family Communication: Mother and father at bedside Status is: Inpatient Remains inpatient appropriate because: Hopefully we can discharge her later today or tomorrow if symptomatically feeling better   Subjective: Feels great no complaints.  Wishes to  go home  Examination:  General exam: Appears calm and comfortable  Respiratory system: Clear to  auscultation. Respiratory effort normal. Cardiovascular system: S1 & S2 heard, RRR. No JVD, murmurs, rubs, gallops or clicks. No pedal edema. Gastrointestinal system: Abdomen is nondistended, soft and nontender. No organomegaly or masses felt. Normal bowel sounds heard. Central nervous system: Alert and oriented. No focal neurological deficits. Extremities: Symmetric 5 x 5 power. Skin: No rashes, lesions or ulcers Psychiatry: Judgement and insight appear normal. Mood & affect appropriate.    Discharge Diagnoses:  Principal Problem:   Intractable nausea and vomiting Active Problems:   Essential hypertension   ESRD on dialysis (HCC)   Uncontrolled type 1 diabetes mellitus with hyperglycemia, with long-term current use of insulin (HCC)   Prolonged QT interval   Chronic HFrEF (heart failure with reduced ejection fraction) (HCC)   Dyslipidemia   Bipolar 2 disorder (HCC)   Gastroparesis   Pulmonary embolism (HCC)   Internal jugular vein thrombosis, right (HCC)    Discharge Exam: Vitals:   08/07/23 1257 08/07/23 1259  BP: 106/79 102/76  Pulse: 84 82  Resp: 14 13  Temp: 98.4 F (36.9 C)   SpO2: 97% 97%   Vitals:   08/07/23 1151 08/07/23 1257 08/07/23 1259 08/07/23 1302  BP: 110/81 106/79 102/76   Pulse:  84 82   Resp: 16 14 13    Temp: 98.1 F (36.7 C) 98.4 F (36.9 C)    TempSrc: Oral Oral    SpO2: 96% 97% 97%   Weight:    54.8 kg  Height:        General: Pt is alert, awake, not in acute distress Cardiovascular: RRR, S1/S2 +, no rubs, no gallops Respiratory: CTA bilaterally, no wheezing, no rhonchi Abdominal: Soft, NT, ND, bowel sounds + Extremities: no edema, no cyanosis  Discharge Instructions   Allergies as of 08/07/2023       Reactions   Cantaloupe Extract Allergy Skin Test Itching, Other (See Comments)   Mouth itching   Citrullus Vulgaris Itching, Other (See Comments)   Makes mouth itch, ALL melons     Food Itching, Other (See Comments)   All melon -  mouth itching   Strawberry Extract Itching, Other (See Comments)   Mouth itching   Nsaids Itching, Other (See Comments)   Avoid per nephrology        Medication List     STOP taking these medications    hydrALAZINE 100 MG tablet Commonly known as: APRESOLINE   sacubitril-valsartan 24-26 MG Commonly known as: ENTRESTO       TAKE these medications    acetaminophen 500 MG tablet Commonly known as: TYLENOL Take 1,000 mg by mouth daily as needed for headache (pain).   amitriptyline 50 MG tablet Commonly known as: ELAVIL Take 1 tablet (50 mg total) by mouth at bedtime.   atorvastatin 40 MG tablet Commonly known as: LIPITOR Take 1 tablet (40 mg total) by mouth every evening.   calcium acetate 667 MG capsule Commonly known as: PHOSLO Take 1,334 mg by mouth 3 (three) times daily with meals.   carvedilol 3.125 MG tablet Commonly known as: COREG Take 1 tablet (3.125 mg total) by mouth 2 (two) times daily with a meal. What changed:  medication strength how much to take   cephALEXin 500 MG capsule Commonly known as: KEFLEX Take 1 capsule (500 mg total) by mouth 2 (two) times daily for 3 days.   clonazePAM 0.5 MG tablet Commonly known as: KLONOPIN Take 1  tablet (0.5 mg total) by mouth 2 (two) times daily as needed for anxiety. What changed: when to take this   dicyclomine 20 MG tablet Commonly known as: BENTYL Take 1 tablet (20 mg total) by mouth 2 (two) times daily.   Eliquis 2.5 MG Tabs tablet Generic drug: apixaban Take 2.5 mg by mouth 2 (two) times daily.   famotidine 20 MG tablet Commonly known as: PEPCID Take 1 tablet (20 mg total) by mouth 2 (two) times daily.   hydrOXYzine 10 MG tablet Commonly known as: ATARAX Take 10 mg by mouth daily. Also taken before dialysis on Tues, Thurs, Sat.   insulin lispro 100 UNIT/ML injection Commonly known as: HUMALOG Inject 0.5 Units into the skin See admin instructions. 0.5 units per hour via insulin pump.    isosorbide mononitrate 30 MG 24 hr tablet Commonly known as: IMDUR Take 0.5 tablets (15 mg total) by mouth daily. Start taking on: August 08, 2023 What changed: how much to take   magnesium oxide 400 MG tablet Commonly known as: MAG-OX Take 2 tablets (800 mg total) by mouth daily.   methocarbamol 500 MG tablet Commonly known as: ROBAXIN Take 500 mg by mouth as needed for muscle spasms.   omeprazole 20 MG capsule Commonly known as: PRILOSEC Take 20 mg by mouth daily.   ondansetron 4 MG disintegrating tablet Commonly known as: ZOFRAN-ODT Take 1 tablet (4 mg total) by mouth every 8 (eight) hours as needed for nausea or vomiting.   Rena-Vite Rx 1 MG Tabs Take 1 tablet by mouth daily.   senna-docusate 8.6-50 MG tablet Commonly known as: Senokot-S Take 1 tablet by mouth daily.   SUMAtriptan 50 MG tablet Commonly known as: IMITREX Take 50 mg by mouth daily as needed for headache.   Ubrelvy 100 MG Tabs Generic drug: Ubrogepant Take 100 mg by mouth daily as needed (migraine).   valACYclovir 500 MG tablet Commonly known as: VALTREX Take 500 mg by mouth daily as needed (cold sores).   Velphoro 500 MG chewable tablet Generic drug: sucroferric oxyhydroxide Chew 1,000 mg by mouth 3 (three) times daily with meals.   Vitamin D (Ergocalciferol) 1.25 MG (50000 UNIT) Caps capsule Commonly known as: DRISDOL Take 50,000 Units by mouth every Sunday.        Follow-up Information     Loura Pardon, PA Follow up in 1 week(s).   Specialty: Physician Assistant Contact information: [address] [place] [address] [phone]                Allergies  Allergen Reactions   Cantaloupe Extract Allergy Skin Test Itching and Other (See Comments)    Mouth itching     Citrullus Vulgaris Itching and Other (See Comments)    Makes mouth itch, ALL melons     Food Itching and Other (See Comments)    All melon - mouth itching   Strawberry Extract  Itching and Other (See Comments)    Mouth itching   Nsaids Itching and Other (See Comments)    Avoid per nephrology    You were cared for by a hospitalist during your hospital stay. If you have any questions about your discharge medications or the care you received while you were in the hospital after you are discharged, you can call the unit and asked to speak with the hospitalist on call if the hospitalist that took care of you is not available. Once you are discharged, your primary care physician will handle any further  medical issues. Please note that no refills for any discharge medications will be authorized once you are discharged, as it is imperative that you return to your primary care physician (or establish a relationship with a primary care physician if you do not have one) for your aftercare needs so that they can reassess your need for medications and monitor your lab values.  You were cared for by a hospitalist during your hospital stay. If you have any questions about your discharge medications or the care you received while you were in the hospital after you are discharged, you can call the unit and asked to speak with the hospitalist on call if the hospitalist that took care of you is not available. Once you are discharged, your primary care physician will handle any further medical issues. Please note that NO REFILLS for any discharge medications will be authorized once you are discharged, as it is imperative that you return to your primary care physician (or establish a relationship with a primary care physician if you do not have one) for your aftercare needs so that they can reassess your need for medications and monitor your lab values.  Please request your Prim.MD to go over all Hospital Tests and Procedure/Radiological results at the follow up, please get all Hospital records sent to your Prim MD by signing hospital release before you go home.  Get CBC, CMP, 2 view Chest X ray  checked  by Primary MD during your next visit or SNF MD in 5-7 days ( we routinely change or add medications that can affect your baseline labs and fluid status, therefore we recommend that you get the mentioned basic workup next visit with your PCP, your PCP may decide not to get them or add new tests based on their clinical decision)  On your next visit with your primary care physician please Get Medicines reviewed and adjusted.  If you experience worsening of your admission symptoms, develop shortness of breath, life threatening emergency, suicidal or homicidal thoughts you must seek medical attention immediately by calling 911 or calling your MD immediately  if symptoms less severe.  You Must read complete instructions/literature along with all the possible adverse reactions/side effects for all the Medicines you take and that have been prescribed to you. Take any new Medicines after you have completely understood and accpet all the possible adverse reactions/side effects.   Do not drive, operate heavy machinery, perform activities at heights, swimming or participation in water activities or provide baby sitting services if your were admitted for syncope or siezures until you have seen by Primary MD or a Neurologist and advised to do so again.  Do not drive when taking Pain medications.   Procedures/Studies: DG Abdomen 1 View  Result Date: 08/03/2023 CLINICAL DATA:  Abdominal pain and vomiting. EXAM: ABDOMEN - 1 VIEW COMPARISON:  06/27/2023 FINDINGS: No gaseous small bowel dilatation. Moderate volume stool seen scattered along the length of the colon. No unexpected abdominopelvic calcification. IMPRESSION: Moderate volume colonic stool. No gaseous bowel dilatation to suggest obstruction. Electronically Signed   By: Kennith Center M.D.   On: 08/03/2023 05:11   DG Chest Portable 1 View  Result Date: 08/02/2023 CLINICAL DATA:  Shortness of breath EXAM: PORTABLE CHEST 1 VIEW COMPARISON:   07/18/2023 FINDINGS: Right-sided central venous catheter tip at the right atrium. Cardiomegaly with vascular congestion and diffuse interstitial edema. No pleural effusion or pneumothorax. IMPRESSION: Cardiomegaly with vascular congestion and mild diffuse interstitial edema Electronically Signed   By: Selena Batten  Jake Samples M.D.   On: 08/02/2023 22:35   CT ABDOMEN PELVIS WO CONTRAST  Result Date: 07/18/2023 CLINICAL DATA:  27 year old female with abdominal pain, nausea vomiting. End stage renal disease on peritoneal dialysis, heart failure. EXAM: CT ABDOMEN AND PELVIS WITHOUT CONTRAST TECHNIQUE: Multidetector CT imaging of the abdomen and pelvis was performed following the standard protocol without IV contrast. RADIATION DOSE REDUCTION: This exam was performed according to the departmental dose-optimization program which includes automated exposure control, adjustment of the mA and/or kV according to patient size and/or use of iterative reconstruction technique. COMPARISON:  CT Abdomen and Pelvis 06/25/2023 and earlier. FINDINGS: Lower chest: Stable cardiomegaly with small or trace pericardial effusion (series 301, image 7). Pericardial fluid has simple density. Dialysis type catheter partially visible at the right atrium. Slightly lower lung volumes. Patchy bilateral lower lobe peribronchial opacity is nonspecific. No pleural effusion. Hepatobiliary: Hyperdense liver might indicate amiodarone therapy. Negative noncontrast gallbladder. Trace perihepatic free fluid, regressed since last month. No peritoneal dialysis catheter identified. Pancreas: Partially atrophied. Spleen: Negative. Adrenals/Urinary Tract: Normal adrenal glands. Stable nonobstructed noncontrast kidneys. Renal vascular calcification bilaterally. Mild chronic asymmetry of the left ureter with no convincing ureteral calculus, stable pelvic vascular calcifications. Stable nonspecific urinary bladder wall thickening. No perivesical stranding. Stomach/Bowel:  Large bowel retained stool mixed with oral contrast or similar hyperdense material. Moderate volume retained stool. Normal appendix on series 301, image 54. No large bowel inflammation identified. No dilated small bowel. Retained food in the stomach. Duodenum is decompressed. No pneumoperitoneum. Mesenteric congestion appears regressed since last month. No convincing active mesenteric inflammation. Vascular/Lymphatic: Visceral calcified atherosclerosis in the abdomen. Femoral artery calcified atherosclerosis bilaterally. Vascular patency is not evaluated in the absence of IV contrast. No lymphadenopathy identified. Reproductive: Negative noncontrast appearance. Other: No convincing pelvis free fluid. Musculoskeletal: No acute osseous abnormality identified. IMPRESSION: 1. Regression of mesenteric congestion and ascites since last month. No peritoneal dialysis catheter. 2. No new acute or inflammatory process identified in the noncontrast abdomen. Moderate volume retained stool. 3. Patchy new bilateral lower lobe peribronchial opacity is indeterminate for atelectasis versus infection. No pleural effusion. 4. Stable cardiomegaly, trace simple appearing small or trace pericardial effusion. 5. Calcified atherosclerosis. Electronically Signed   By: Odessa Fleming M.D.   On: 07/18/2023 04:48   DG Chest Portable 1 View  Result Date: 07/18/2023 CLINICAL DATA:  Hypoxia, nausea, vomiting EXAM: PORTABLE CHEST 1 VIEW COMPARISON:  07/14/2023 FINDINGS: Right dialysis catheter remains in place, unchanged. Stable cardiomegaly, vascular congestion. Improving bibasilar opacities. No overt edema, confluent opacities or effusions. No acute bony abnormality. IMPRESSION: Cardiomegaly, vascular congestion. Electronically Signed   By: Charlett Nose M.D.   On: 07/18/2023 03:55   DG Chest Port 1V same Day  Result Date: 07/14/2023 CLINICAL DATA:  Shortness of breath EXAM: PORTABLE CHEST 1 VIEW COMPARISON:  05/27/2023 FINDINGS: Right  dialysis catheter remains in place, unchanged. Cardiomegaly. Vascular congestion. Bilateral lower lobe airspace opacities. No visible effusions or pneumothorax. No acute bony abnormality. IMPRESSION: Cardiomegaly with vascular congestion. Bilateral lower lobe opacities could reflect edema or infection. Electronically Signed   By: Charlett Nose M.D.   On: 07/14/2023 18:59     The results of significant diagnostics from this hospitalization (including imaging, microbiology, ancillary and laboratory) are listed below for reference.     Microbiology: No results found for this or any previous visit (from the past 240 hour(s)).   Labs: BNP (last 3 results) Recent Labs    09/11/22 2222 05/17/23 1632 05/27/23 0852  BNP  2,413.8* 2,673.9* >4,500.0*   Basic Metabolic Panel: Recent Labs  Lab 08/03/23 1635 08/04/23 0253 08/05/23 0334 08/06/23 0318 08/07/23 0312  NA 138 134* 134* 134* 133*  K 5.2* 3.3* 3.8 4.2 5.1  CL 94* 95* 92* 91* 92*  CO2 28 25 26 25 25   GLUCOSE 293* 116* 224* 277* 310*  BUN 40* 14 27* 39* 51*  CREATININE 6.68* 3.29* 6.32* 8.47* 10.61*  CALCIUM 9.4 9.2 8.7* 8.6* 8.8*  PHOS 7.4*  --   --  7.0*  --    Liver Function Tests: Recent Labs  Lab 08/02/23 2134 08/03/23 1200 08/03/23 1635 08/04/23 0253  AST 25 21  --  28  ALT 17 15  --  17  ALKPHOS 132* 130*  --  127*  BILITOT 1.3* 1.3*  --  1.2*  PROT 8.4* 8.1  --  7.8  ALBUMIN 4.1 3.9 3.7 3.6   Recent Labs  Lab 08/02/23 2134  LIPASE 27   No results for input(s): "AMMONIA" in the last 168 hours. CBC: Recent Labs  Lab 08/03/23 1200 08/04/23 0253 08/05/23 0334 08/06/23 0318 08/07/23 0312  WBC 6.1 6.9 5.6 6.3 6.3  NEUTROABS 5.4  --   --   --   --   HGB 12.7 13.4 13.5 14.0 13.3  HCT 39.1 40.5 40.8 42.5 39.6  MCV 99.5 98.5 98.6 99.5 97.5  PLT 201 208 210 214 176   Cardiac Enzymes: No results for input(s): "CKTOTAL", "CKMB", "CKMBINDEX", "TROPONINI" in the last 168 hours. BNP: Invalid input(s):  "POCBNP" CBG: Recent Labs  Lab 08/06/23 1257 08/06/23 1652 08/06/23 2058 08/07/23 0611 08/07/23 1151  GLUCAP 246* 176* 168* 350* 144*   D-Dimer No results for input(s): "DDIMER" in the last 72 hours. Hgb A1c No results for input(s): "HGBA1C" in the last 72 hours. Lipid Profile No results for input(s): "CHOL", "HDL", "LDLCALC", "TRIG", "CHOLHDL", "LDLDIRECT" in the last 72 hours. Thyroid function studies No results for input(s): "TSH", "T4TOTAL", "T3FREE", "THYROIDAB" in the last 72 hours.  Invalid input(s): "FREET3" Anemia work up No results for input(s): "VITAMINB12", "FOLATE", "FERRITIN", "TIBC", "IRON", "RETICCTPCT" in the last 72 hours. Urinalysis    Component Value Date/Time   COLORURINE BROWN (A) 08/06/2023 0839   APPEARANCEUR TURBID (A) 08/06/2023 0839   LABSPEC  08/06/2023 0839    TEST NOT REPORTED DUE TO COLOR INTERFERENCE OF URINE PIGMENT   PHURINE  08/06/2023 0839    TEST NOT REPORTED DUE TO COLOR INTERFERENCE OF URINE PIGMENT   GLUCOSEU (A) 08/06/2023 0839    TEST NOT REPORTED DUE TO COLOR INTERFERENCE OF URINE PIGMENT   HGBUR (A) 08/06/2023 0839    TEST NOT REPORTED DUE TO COLOR INTERFERENCE OF URINE PIGMENT   BILIRUBINUR (A) 08/06/2023 0839    TEST NOT REPORTED DUE TO COLOR INTERFERENCE OF URINE PIGMENT   KETONESUR (A) 08/06/2023 0839    TEST NOT REPORTED DUE TO COLOR INTERFERENCE OF URINE PIGMENT   PROTEINUR (A) 08/06/2023 0839    TEST NOT REPORTED DUE TO COLOR INTERFERENCE OF URINE PIGMENT   UROBILINOGEN 0.2 12/15/2015 1736   NITRITE (A) 08/06/2023 0839    TEST NOT REPORTED DUE TO COLOR INTERFERENCE OF URINE PIGMENT   LEUKOCYTESUR (A) 08/06/2023 0839    TEST NOT REPORTED DUE TO COLOR INTERFERENCE OF URINE PIGMENT   Sepsis Labs Recent Labs  Lab 08/04/23 0253 08/05/23 0334 08/06/23 0318 08/07/23 0312  WBC 6.9 5.6 6.3 6.3   Microbiology No results found for this or any previous visit (from the  past 240 hour(s)).   Time coordinating discharge:   I have spent 35 minutes face to face with the patient and on the ward discussing the patients care, assessment, plan and disposition with other care givers. >50% of the time was devoted counseling the patient about the risks and benefits of treatment/Discharge disposition and coordinating care.   SIGNED:   Miguel Rota, MD  [facility] 08/07/2023, 1:08 PM   If 7PM-7AM, please contact night-coverage

## 2023-08-07 NOTE — Progress Notes (Signed)
Received patient in bed to unit.  Alert and oriented.  Informed consent signed and in chart.   TX duration:3.5  Patient tolerated well.  Transported back to the room  Alert, without acute distress.  Hand-off given to patient's nurse.   Access used: right HD CATHETER Access issues: NONE  Total UF removed: 2L Medication(s) given: DILUADID   08/07/23 1637  Vitals  Temp 98 F (36.7 C)  Temp Source Oral  BP 116/82  MAP (mmHg) 93  BP Location Left Arm  BP Method Automatic  Patient Position (if appropriate) Lying  Pulse Rate (!) 45  Pulse Rate Source Monitor  ECG Heart Rate 92  Resp 14  Oxygen Therapy  SpO2 100 %  O2 Device Room Air  During Treatment Monitoring  Blood Flow Rate (mL/min) 399 mL/min  Arterial Pressure (mmHg) -228.47 mmHg  Venous Pressure (mmHg) 219.18 mmHg  TMP (mmHg) 13.53 mmHg  Ultrafiltration Rate (mL/min) 966 mL/min  Dialysate Flow Rate (mL/min) 300 ml/min  Duration of HD Treatment -hour(s) 3.48 hour(s)  Cumulative Fluid Removed (mL) per Treatment  1976.44  HD Safety Checks Performed Yes  Intra-Hemodialysis Comments Tx completed;Tolerated well  Dialysis Fluid Bolus Normal Saline  Bolus Amount (mL) 300 mL      Stephon Weathers [facility]

## 2023-08-08 LAB — URINE CULTURE: Culture: >=100000 — AB

## 2023-08-08 NOTE — Discharge Planning (Signed)
[facility] Patient Discharge Orders- [facility] CLINIC: [name]  Patient's name: Kathy Lewis Admit/DC Dates: 08/02/2023 - 08/07/2023  Discharge Diagnoses: Intractable nausea/vomiting   Hyperkalemia UTI  Aranesp: Given: no   Date and amount of last dose: n/a  Last Hgb: 13.3. PRBC's Given: no Date/# of units: n/a ESA dose for discharge: none IV Iron dose at discharge: none  Heparin change: no  EDW Change: yes New EDW: 52.5kg  Bath Change: no  Access intervention/Change: no Details:  Hectorol/Calcitriol change: no  Discharge Labs: Calcium 8.8 Phosphorus 7.0 Albumin 3.6 K+ 5.1  IV Antibiotics: No, on PO keflex Details:  On Coumadin?: no Last INR: Next INR: Managed By:   OTHER/APPTS/LAB ORDERS:    D/C Meds to be reconciled by nurse after every discharge.  Completed By: Rogers Blocker, PA-C 08/08/2023, 1:40 PM  [facility] Pager: [phone]    Reviewed by: MD:______ RN_______

## 2023-09-27 NOTE — Progress Notes (Signed)
 ADVANCED HF CLINIC NOTE   PCP: Lesa Jon HERO, PA Primary Cardiologist: Dr. Cherrie  Chief complaint: Heart failure follow up  HPI: Kathy Lewis is a 28 y.o. 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 Has been followed at [facility] AHF team but work-up complicated by lack of f/o and compliance issues.    Admitted to [facility] and underwent RHC with markedly elevated filling pressures and low output. Started on inotropes. Also underwent CVVHD for several days but refused conversion to PD. Left AMA.   Echo 3/23 EF 20% RV moderately reduced   Admitted 1/24 for HF decompensation. PD not working. Switched to HD.  I saw her in clinic 1/24 and referred to [facility] for heart/kidney/pancreas transplant eval. Cancelled appointment due to COVID  Admitted 10/04/22 with intractable n/v and HTN urgency. Head CT with small chronic appearing right superior cerebellar infarct. No acute changes. Developed hypoglycemia and persistent uncontrolled hypertension. Admitted to the ICU for clevidipine  infusion. Oral meds adjusted  Echo 10/05/22 EF < 20% RV moderately reduced. Mild MR  Seen in ED 10/22/22 with COVID. Left AMA  Returned to ED 11/14/22 with abdominal pain. Given IV fluids, reglan , and dilaudid . She left ED.   (Had normal gastric emptying study in 6/23)  Was seen at [facility] for heart-kidney transplant evaluation. CPX 4/24. pVO2 10.7 (34%) VE/VCO2 36 RER 0.92. Has been deferred for now until she can prove better compliance.  Admitted 6/24 with N/V.   Echo 05/07/23: EF 20-25%   She's had multiple recent ED visits and admissions for abdominal pain, nausea and vomiting. Readmitted with abdominal pain. GI previously felt may be cyclical vomiting syndrome +/- abdominal congestion from CHF. Entresto  held with hyperkalemia. Planned to to lower dry weight as tolerated.  Admitted 12/24 with UTI and N/V.  Today she returns for post hospital follow  up. Overall feeling pretty good. Denies palpitations, CP, dizziness, edema, or PND/Orthopnea. Denies SOB. Appetite ok. No fever or chills. Weight at home 127 pounds. Taking all medications. Her dad helps her take care of her daughter.   ROS: All systems negative except as listed in HPI, PMH and Problem List.  SH:  Social History   Socioeconomic History   Marital status: Single    Spouse name: Not on file   Number of children: 1   Years of education: Not on file   Highest education level: Not on file  Occupational History   Occupation: disabled  Tobacco Use   Smoking status: Former    Current packs/day: 1.00    Average packs/day: 1 pack/day for 2.0 years (2.0 ttl pk-yrs)    Types: Cigars, Cigarettes   Smokeless tobacco: Never  Vaping Use   Vaping status: Never Used  Substance and Sexual Activity   Alcohol use: No   Drug use: No   Sexual activity: Yes    Birth control/protection: None  Other Topics Concern   Not on file  Social History Narrative   Not on file   Social Drivers of Health   Financial Resource Strain: Low Risk  (04/19/2023)   Received from [facility]   Overall Financial Resource Strain (CARDIA)    Difficulty of Paying Living Expenses: Not hard at all  Food Insecurity: Patient Unable To Answer (08/03/2023)   Hunger Vital Sign    Worried About Running Out of Food in the Last Year: Patient unable to answer    Ran Out of Food in the Last  Year: Patient unable to answer  Transportation Needs: No Transportation Needs (07/13/2023)   PRAPARE - Administrator, Civil Service (Medical): No    Lack of Transportation (Non-Medical): No  Recent Concern: Transportation Needs - Unmet Transportation Needs (04/14/2023)   Received from [facility]   Transportation    In the past 12 months, has lack of reliable transportation kept you from medical appointments, meetings, work or from getting things needed for daily living? : Yes  Physical Activity: Unknown  (04/19/2023)   Received from [facility]   Exercise Vital Sign    Days of Exercise per Week: 0 days    Minutes of Exercise per Session: Not on file  Stress: No Stress Concern Present (04/19/2023)   Received from [facility] - Occupational Stress Questionnaire    Feeling of Stress : Not at all  Social Connections: Moderately Integrated (04/19/2023)   Received from [facility]   Social Network    How would you rate your social network (family, work, friends)?: Adequate participation with social networks  Intimate Partner Violence: Unknown (08/03/2023)   Humiliation, Afraid, Rape, and Kick questionnaire    Fear of Current or Ex-Partner: Patient declined    Emotionally Abused: No    Physically Abused: No    Sexually Abused: No    FH:  Family History  Adopted: Yes  Problem Relation Age of Onset   Heart disease Neg Hx     Past Medical History:  Diagnosis Date   Anemia    Anxiety    Bipolar 2 disorder (HCC)    Chronic kidney disease    Chronic systolic (congestive) heart failure (HCC)    Depression    DKA (diabetic ketoacidoses)    ESRD on peritoneal dialysis (HCC)    HSV infection    on valtrex    Hypokalemia    Leukocytosis    Migraine    Noncompliance with medication regimen    Preeclampsia    Prolonged QT syndrome    Severe anemia    Type 1 diabetes mellitus (HCC)     Current Outpatient Medications  Medication Sig Dispense Refill   acetaminophen  (TYLENOL ) 500 MG tablet Take 1,000 mg by mouth daily as needed for headache (pain).     amitriptyline  (ELAVIL ) 50 MG tablet Take 1 tablet (50 mg total) by mouth at bedtime. 30 tablet 3   atorvastatin  (LIPITOR ) 40 MG tablet Take 1 tablet (40 mg total) by mouth every evening. 30 tablet 11   B Complex-C-Folic Acid  (RENA-VITE RX) 1 MG TABS Take 1 tablet by mouth daily.     calcium  acetate (PHOSLO ) 667 MG capsule Take 1,334 mg by mouth 3 (three) times daily with meals.     carvedilol   (COREG ) 3.125 MG tablet Take 1 tablet (3.125 mg total) by mouth 2 (two) times daily with a meal. 60 tablet 0   clonazePAM  (KLONOPIN ) 0.5 MG tablet Take 1 tablet (0.5 mg total) by mouth 2 (two) times daily as needed for anxiety. (Patient taking differently: Take 0.5 mg by mouth at bedtime.) 20 tablet 0   ELIQUIS  2.5 MG TABS tablet Take 2.5 mg by mouth 2 (two) times daily.     famotidine  (PEPCID ) 20 MG tablet Take 1 tablet (20 mg total) by mouth 2 (two) times daily. 30 tablet 0   hydrOXYzine  (ATARAX ) 10 MG tablet Take 10 mg by mouth daily.     insulin  lispro (HUMALOG ) 100 UNIT/ML injection Inject 0.5 Units into  the skin See admin instructions. 0.5 units per hour via insulin  pump.     isosorbide  mononitrate (IMDUR ) 30 MG 24 hr tablet Take 0.5 tablets (15 mg total) by mouth daily. 30 tablet 0   magnesium  oxide (MAG-OX) 400 MG tablet Take 2 tablets (800 mg total) by mouth daily. 60 tablet 6   methocarbamol  (ROBAXIN ) 500 MG tablet Take 500 mg by mouth as needed for muscle spasms.     omeprazole (PRILOSEC) 20 MG capsule Take 20 mg by mouth daily.     ondansetron  (ZOFRAN -ODT) 4 MG disintegrating tablet Take 1 tablet (4 mg total) by mouth every 8 (eight) hours as needed for nausea or vomiting. 30 tablet 0   SUMAtriptan  (IMITREX ) 50 MG tablet Take 50 mg by mouth daily as needed for headache.     UBRELVY  100 MG TABS Take 100 mg by mouth daily as needed (migraine).     valACYclovir  (VALTREX ) 500 MG tablet Take 500 mg by mouth daily as needed (cold sores).     VELPHORO  500 MG chewable tablet Chew 1,000 mg by mouth 3 (three) times daily with meals.     Vitamin D , Ergocalciferol , (DRISDOL ) 1.25 MG (50000 UNIT) CAPS capsule Take 50,000 Units by mouth every Sunday.     No current facility-administered medications for this encounter.    Vitals:   10/04/23 1100  BP: 108/88  Pulse: 86  SpO2: 97%  Weight: 58 kg (127 lb 12.8 oz)    Wt Readings from Last 3 Encounters:  10/04/23 58 kg (127 lb 12.8 oz)   08/07/23 52.5 kg (115 lb 11.9 oz)  07/18/23 53.9 kg (118 lb 13.3 oz)    PHYSICAL EXAM: General:  well appearing.  No respiratory difficulty HEENT: normal Neck: supple. JVD ~6 cm. Carotids 2+ bilat; no bruits. No lymphadenopathy or thyromegaly appreciated. Cor: PMI nondisplaced. Regular rate & rhythm. No rubs, gallops or murmurs. Lungs: clear Abdomen: soft, nontender, nondistended. No hepatosplenomegaly. No bruits or masses. Good bowel sounds. Extremities: no cyanosis, clubbing, rash, edema  Neuro: alert & oriented x 3, cranial nerves grossly intact. moves all 4 extremities w/o difficulty. Affect pleasant.   EKG: NSR, BAE 82 bpm (Personally reviewed)    ASSESSMENT & PLAN: 1. Chronic systolic heart failure - Hx Peripartum CM 03/2020. RHC 07/29/20:  CO low-normal by TD (CO/CI) Thermodilution: 4.07/2.59) and normal cardiac output by Fick (CO/CI Fick: 4.7/3.0) ECHOs 21' suggested evidence of possible non-compaction, but cMRI 41' showed no evidence of infarction, myocarditis, or infiltrative patterns. TTE on 07/30/20 with LVEF 35-40% but declined to 25-30% around 04/2021 likely in the setting of noncompliance with medical therapies.  - Echo 10/2021: EF of 20%. Small pericardial effusion. Indeterminate diastolic function   - Echo 08/14/22: EF 13%, mod MR, severe TR, PASP ~50, small effusion - RHC at [facility] 12/23: RA mean, PA mean 56, PCWP 39, PA sat 56%, Fick CI 1.7, Thermo CI 1.9 - Echo 10/05/22 EF < 20% RV moderately reduced. Mild MR - CPX 4/24. pVO2 10.7 (34%) VE/VCO2 36 RER 0.92. - Echo 05/07/23: EF 20-25%  - Echo 10/24: EF 20-25%, RVSP 48 mmHg, moderate to severe TR, linear mobile density in RA (may represent thrombin sheath from prior line) - Previously followed by AHF team at [facility] but has switched care here - Improved NYHA II. Volume looks good today. Had HD this morning.  - Now tolerating carvedilol  25 bid, hydral 100 bid/imdur  30 daily and entresto  24/26 mg BID. Will check labs today given  recent hyperkalemia.  -  Has been seen at [facility] for heart-kidney-pancreas transplant in 4/24. She has been turned down for compliance issues (signing out AMA). We discussed this in detail and discussed need to show complete compliance with meds, HD and office visits as well as tight control of DM. If she can maintain this we will refer back for re-evaluation. She is doing so much better now that she has family support for caring for her daughter.   2. End stage renal disease  - Volume status much improved on HD  - Continue HD. Followed in [place]. - She is scheduled for AVF 10/28 at [facility]. Explained to patient that her perioperative risk is at least moderately increased in view of her cardiac history.   3. DMI - Follows with Endocrine - Hgb A1c 9% 10/24  4. HTN - BP controlled. - GDMT as above  5. N/V - suspect DM gastroparesis but emyptying study ok in 6/23 - RHC did not support low output state - GI suspected possible cyclical vomiting syndrome - Improving, has not had in a while  6. RIJ DVT - on Eliquis  2.5 bid - Sometimes (rarely) notices blood in her urine, CBC today  7. RA density -Linear mobile density noted in RA on echoes in 10/24, 09/24 and 02/24. Possible thrombin sheath from prior line insertion. No evidence of endocarditis.  Follow-up 2-3 months with Dr. Bensimhon.  Beckey LITTIE Coe, NP  11:50 AM

## 2023-10-03 ENCOUNTER — Telehealth (HOSPITAL_COMMUNITY): Payer: Self-pay

## 2023-10-03 NOTE — Telephone Encounter (Signed)
 Called to confirm/remind patient of their appointment at the [facility] on 10/04/23.   Patient reminded to bring all medications and/or complete list.  Confirmed patient has transportation. Gave directions, instructed to utilize valet parking.  Confirmed appointment prior to ending call.

## 2023-10-04 ENCOUNTER — Encounter (HOSPITAL_COMMUNITY): Payer: Self-pay

## 2023-10-04 ENCOUNTER — Ambulatory Visit (HOSPITAL_COMMUNITY)
Admission: RE | Admit: 2023-10-04 | Discharge: 2023-10-04 | Disposition: A | Discharge status: Home / Self Care | Payer: 59 | Source: Ambulatory Visit | Attending: Internal Medicine | Admitting: Internal Medicine

## 2023-10-04 VITALS — BP 108/88 | HR 86 | Wt 127.8 lb

## 2023-10-04 DIAGNOSIS — I10 Essential (primary) hypertension: Secondary | ICD-10-CM | POA: Diagnosis not present

## 2023-10-04 DIAGNOSIS — E1022 Type 1 diabetes mellitus with diabetic chronic kidney disease: Secondary | ICD-10-CM | POA: Diagnosis present

## 2023-10-04 DIAGNOSIS — I132 Hypertensive heart and chronic kidney disease with heart failure and with stage 5 chronic kidney disease, or end stage renal disease: Secondary | ICD-10-CM | POA: Insufficient documentation

## 2023-10-04 DIAGNOSIS — Z87891 Personal history of nicotine dependence: Secondary | ICD-10-CM | POA: Diagnosis not present

## 2023-10-04 DIAGNOSIS — R112 Nausea with vomiting, unspecified: Secondary | ICD-10-CM | POA: Insufficient documentation

## 2023-10-04 DIAGNOSIS — Z7901 Long term (current) use of anticoagulants: Secondary | ICD-10-CM | POA: Diagnosis not present

## 2023-10-04 DIAGNOSIS — Z992 Dependence on renal dialysis: Secondary | ICD-10-CM | POA: Insufficient documentation

## 2023-10-04 DIAGNOSIS — E1065 Type 1 diabetes mellitus with hyperglycemia: Secondary | ICD-10-CM

## 2023-10-04 DIAGNOSIS — I8290 Acute embolism and thrombosis of unspecified vein: Secondary | ICD-10-CM

## 2023-10-04 DIAGNOSIS — I5022 Chronic systolic (congestive) heart failure: Secondary | ICD-10-CM | POA: Insufficient documentation

## 2023-10-04 DIAGNOSIS — N186 End stage renal disease: Secondary | ICD-10-CM | POA: Diagnosis present

## 2023-10-04 DIAGNOSIS — Z86718 Personal history of other venous thrombosis and embolism: Secondary | ICD-10-CM | POA: Insufficient documentation

## 2023-10-04 DIAGNOSIS — Q209 Congenital malformation of cardiac chambers and connections, unspecified: Secondary | ICD-10-CM

## 2023-10-04 LAB — CBC
HCT: 35.2 % — ABNORMAL LOW (ref 36.0–46.0)
Hemoglobin: 11.9 g/dL — ABNORMAL LOW (ref 12.0–15.0)
MCH: 33.9 pg (ref 26.0–34.0)
MCHC: 33.8 g/dL (ref 30.0–36.0)
MCV: 100.3 fL — ABNORMAL HIGH (ref 80.0–100.0)
Platelets: 204 10*3/uL (ref 150–400)
RBC: 3.51 MIL/uL — ABNORMAL LOW (ref 3.87–5.11)
RDW: 14 % (ref 11.5–15.5)
WBC: 4.2 10*3/uL (ref 4.0–10.5)
nRBC: 0 % (ref 0.0–0.2)

## 2023-10-04 LAB — BRAIN NATRIURETIC PEPTIDE: B Natriuretic Peptide: 1455.6 pg/mL — ABNORMAL HIGH (ref 0.0–100.0)

## 2023-10-04 LAB — BASIC METABOLIC PANEL
Anion gap: 15 (ref 5–15)
BUN: 25 mg/dL — ABNORMAL HIGH (ref 6–20)
CO2: 28 mmol/L (ref 22–32)
Calcium: 8.6 mg/dL — ABNORMAL LOW (ref 8.9–10.3)
Chloride: 94 mmol/L — ABNORMAL LOW (ref 98–111)
Creatinine, Ser: 4.46 mg/dL — ABNORMAL HIGH (ref 0.44–1.00)
GFR, Estimated: 13 mL/min — ABNORMAL LOW (ref >60)
Glucose, Bld: 132 mg/dL — ABNORMAL HIGH (ref 70–99)
Potassium: 4.2 mmol/L (ref 3.5–5.1)
Sodium: 137 mmol/L (ref 135–145)

## 2023-10-04 NOTE — Patient Instructions (Signed)
 Medication Changes:  No Changes In Medications at this time.   Lab Work:  Labs done today, your results will be available in MyChart, we will contact you for abnormal readings.  Follow-Up in: 2-3 MONTHS PLEASE CALL OUR OFFICE AROUND MARCH 2025 TO GET SCHEDULED FOR YOUR APPOINTMENT. PHONE NUMBER IS [phone] OPTION 2   At the [facility], you and your health needs are our priority. We have a designated team specialized in the treatment of Heart Failure. This Care Team includes your primary Heart Failure Specialized Cardiologist (physician), Advanced Practice Providers (APPs- Physician Assistants and Nurse Practitioners), and Pharmacist who all work together to provide you with the care you need, when you need it.   You may see any of the following providers on your designated Care Team at your next follow up:  Dr. Toribio Fuel Dr. Ezra Shuck Dr. Ria Commander Dr. Odis Brownie Greig Mosses, NP Caffie Shed, [place] [facility] [place], [place] Beckey Coe, NP Jordan Lee, NP Tinnie Redman, PharmD   Please be sure to bring in all your medications bottles to every appointment.   Need to Contact Us :  If you have any questions or concerns before your next appointment please send us  a message through [place] or call our office at [phone].    TO LEAVE A MESSAGE FOR THE NURSE SELECT OPTION 2, PLEASE LEAVE A MESSAGE INCLUDING: YOUR NAME DATE OF BIRTH CALL BACK NUMBER REASON FOR CALL**this is important as we prioritize the call backs  YOU WILL RECEIVE A CALL BACK THE SAME DAY AS LONG AS YOU CALL BEFORE 4:00 PM

## 2023-10-05 ENCOUNTER — Telehealth (HOSPITAL_COMMUNITY): Payer: Self-pay

## 2023-10-05 NOTE — Telephone Encounter (Signed)
-----   Message from Sheryl Donna sent at 10/04/2023  4:07 PM EST ----- Slight drop in Hgb but MCV stable. Would follow up with PCP, may need urology referral if it does not improve.

## 2023-10-05 NOTE — Telephone Encounter (Signed)
Attempted to call patient, left message for patient to call back to office.   

## 2023-10-27 IMAGING — DX DG CHEST 1V PORT
1 series · 1 of 1 positions shown · non-contrast
Comparison: 01/24/2022

CLINICAL DATA: Chest pain

EXAM:
PORTABLE CHEST 1 VIEW

[chest ap]
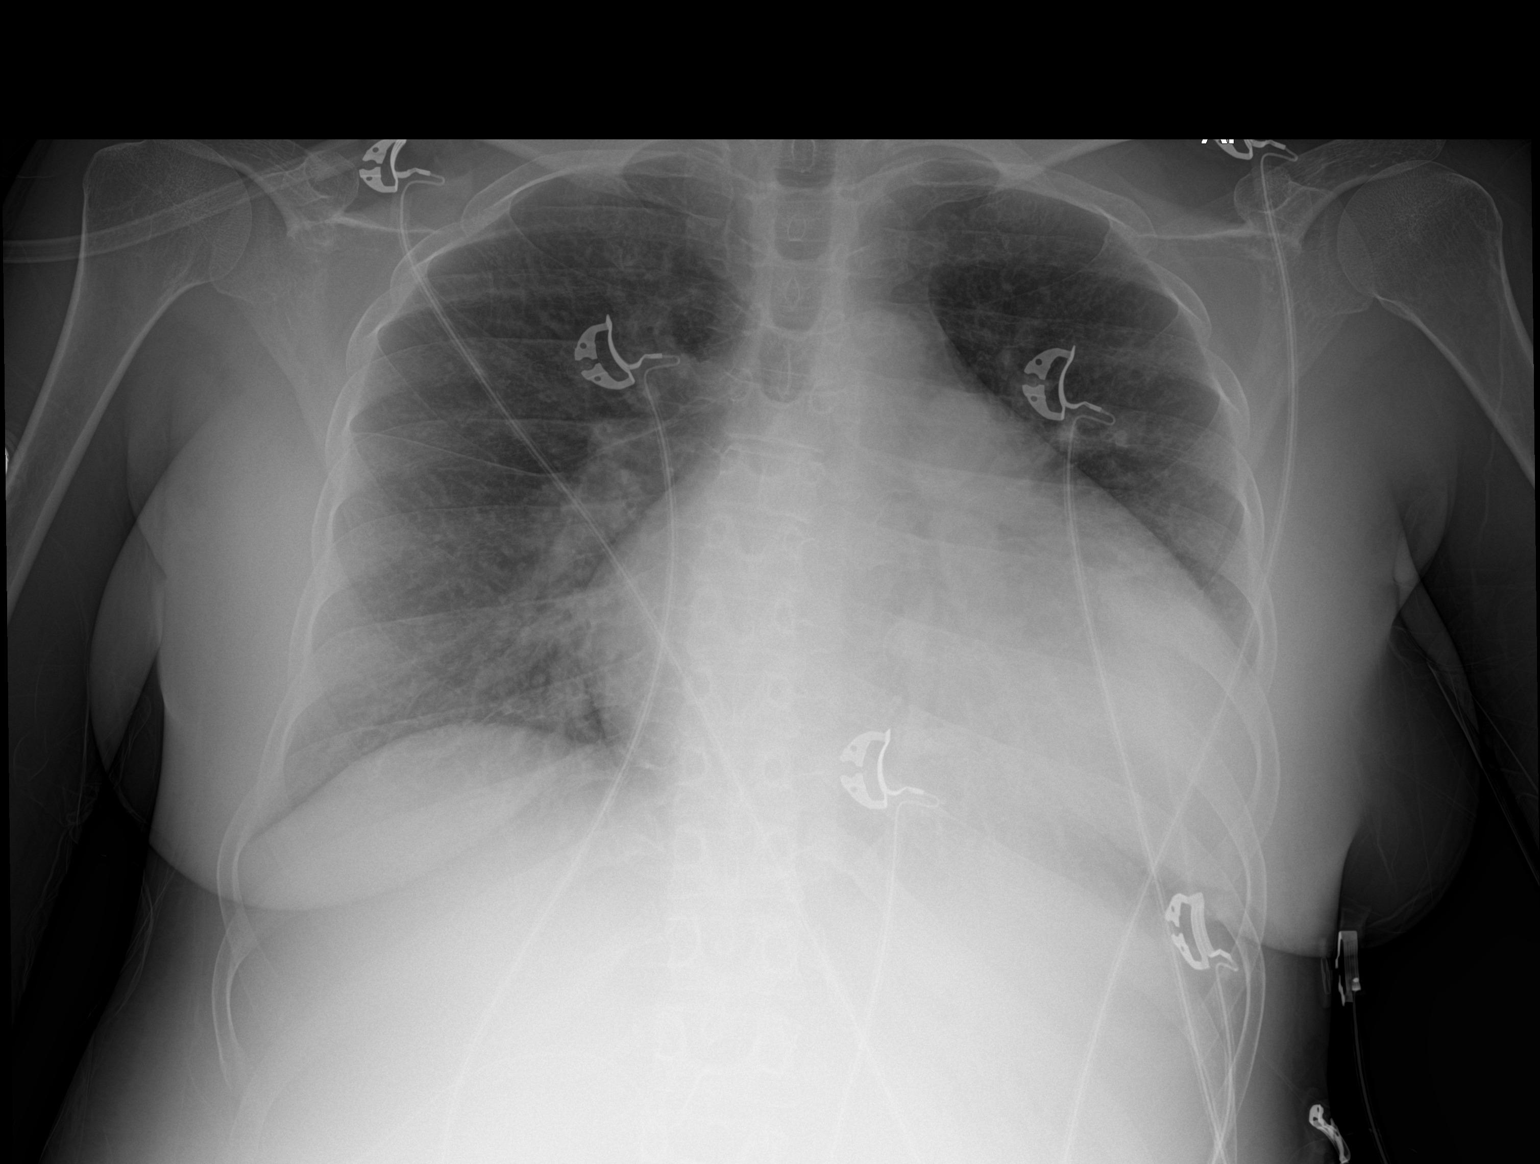

[1 of 1 positions shown; findings below may reference images not displayed]

FINDINGS: Cardiomegaly with vascular congestion and probable mild interstitial
edema. No pleural effusion or pneumothorax.
IMPRESSION: Cardiomegaly with vascular congestion and mild interstitial edema

## 2023-11-10 ENCOUNTER — Encounter (HOSPITAL_COMMUNITY): Payer: Self-pay | Admitting: Internal Medicine

## 2023-11-12 NOTE — Telephone Encounter (Signed)
@  [place] Please provide insight on the [facility] referral

## 2023-11-17 ENCOUNTER — Emergency Department (HOSPITAL_COMMUNITY)

## 2023-11-17 ENCOUNTER — Other Ambulatory Visit: Payer: Self-pay

## 2023-11-17 ENCOUNTER — Emergency Department (HOSPITAL_COMMUNITY)
Admission: EM | Admit: 2023-11-17 | Discharge: 2023-11-17 | Disposition: A | Discharge status: Home / Self Care | Attending: Emergency Medicine | Admitting: Emergency Medicine

## 2023-11-17 ENCOUNTER — Encounter (HOSPITAL_COMMUNITY): Payer: Self-pay

## 2023-11-17 DIAGNOSIS — R112 Nausea with vomiting, unspecified: Secondary | ICD-10-CM

## 2023-11-17 DIAGNOSIS — I10 Essential (primary) hypertension: Secondary | ICD-10-CM

## 2023-11-17 DIAGNOSIS — I509 Heart failure, unspecified: Secondary | ICD-10-CM | POA: Insufficient documentation

## 2023-11-17 DIAGNOSIS — R0602 Shortness of breath: Secondary | ICD-10-CM | POA: Diagnosis present

## 2023-11-17 DIAGNOSIS — Z79899 Other long term (current) drug therapy: Secondary | ICD-10-CM | POA: Insufficient documentation

## 2023-11-17 DIAGNOSIS — E1022 Type 1 diabetes mellitus with diabetic chronic kidney disease: Secondary | ICD-10-CM | POA: Insufficient documentation

## 2023-11-17 DIAGNOSIS — Z794 Long term (current) use of insulin: Secondary | ICD-10-CM | POA: Diagnosis not present

## 2023-11-17 DIAGNOSIS — I132 Hypertensive heart and chronic kidney disease with heart failure and with stage 5 chronic kidney disease, or end stage renal disease: Secondary | ICD-10-CM | POA: Diagnosis present

## 2023-11-17 DIAGNOSIS — N186 End stage renal disease: Secondary | ICD-10-CM | POA: Diagnosis not present

## 2023-11-17 DIAGNOSIS — E1065 Type 1 diabetes mellitus with hyperglycemia: Secondary | ICD-10-CM | POA: Insufficient documentation

## 2023-11-17 DIAGNOSIS — Z7901 Long term (current) use of anticoagulants: Secondary | ICD-10-CM | POA: Diagnosis not present

## 2023-11-17 DIAGNOSIS — D631 Anemia in chronic kidney disease: Secondary | ICD-10-CM | POA: Insufficient documentation

## 2023-11-17 DIAGNOSIS — Z992 Dependence on renal dialysis: Secondary | ICD-10-CM | POA: Insufficient documentation

## 2023-11-17 DIAGNOSIS — E1165 Type 2 diabetes mellitus with hyperglycemia: Secondary | ICD-10-CM

## 2023-11-17 DIAGNOSIS — R569 Unspecified convulsions: Secondary | ICD-10-CM | POA: Diagnosis present

## 2023-11-17 LAB — CBC WITH DIFFERENTIAL/PLATELET
Abs Immature Granulocytes: 0.02 10*3/uL (ref 0.00–0.07)
Basophils Absolute: 0.1 10*3/uL (ref 0.0–0.1)
Basophils Relative: 1 %
Eosinophils Absolute: 0.1 10*3/uL (ref 0.0–0.5)
Eosinophils Relative: 1 %
HCT: 47.9 % — ABNORMAL HIGH (ref 36.0–46.0)
Hemoglobin: 16.3 g/dL — ABNORMAL HIGH (ref 12.0–15.0)
Immature Granulocytes: 0 %
Lymphocytes Relative: 13 %
Lymphs Abs: 1 10*3/uL (ref 0.7–4.0)
MCH: 33.5 pg (ref 26.0–34.0)
MCHC: 34 g/dL (ref 30.0–36.0)
MCV: 98.4 fL (ref 80.0–100.0)
Monocytes Absolute: 0.4 10*3/uL (ref 0.1–1.0)
Monocytes Relative: 5 %
Neutro Abs: 6.1 10*3/uL (ref 1.7–7.7)
Neutrophils Relative %: 80 %
Platelets: 209 10*3/uL (ref 150–400)
RBC: 4.87 MIL/uL (ref 3.87–5.11)
RDW: 13.4 % (ref 11.5–15.5)
WBC: 7.7 10*3/uL (ref 4.0–10.5)
nRBC: 0 % (ref 0.0–0.2)

## 2023-11-17 LAB — COMPREHENSIVE METABOLIC PANEL
ALT: 26 U/L (ref 0–44)
AST: 31 U/L (ref 15–41)
Albumin: 4.3 g/dL (ref 3.5–5.0)
Alkaline Phosphatase: 137 U/L — ABNORMAL HIGH (ref 38–126)
Anion gap: 17 — ABNORMAL HIGH (ref 5–15)
BUN: 23 mg/dL — ABNORMAL HIGH (ref 6–20)
CO2: 24 mmol/L (ref 22–32)
Calcium: 9.5 mg/dL (ref 8.9–10.3)
Chloride: 95 mmol/L — ABNORMAL LOW (ref 98–111)
Creatinine, Ser: 5.55 mg/dL — ABNORMAL HIGH (ref 0.44–1.00)
GFR, Estimated: 10 mL/min — ABNORMAL LOW (ref >60)
Glucose, Bld: 229 mg/dL — ABNORMAL HIGH (ref 70–99)
Potassium: 5.6 mmol/L — ABNORMAL HIGH (ref 3.5–5.1)
Sodium: 136 mmol/L (ref 135–145)
Total Bilirubin: 1.1 mg/dL (ref 0.0–1.2)
Total Protein: 8.9 g/dL — ABNORMAL HIGH (ref 6.5–8.1)

## 2023-11-17 LAB — CBG MONITORING, ED: Glucose-Capillary: 226 mg/dL — ABNORMAL HIGH (ref 70–99)

## 2023-11-17 LAB — I-STAT VENOUS BLOOD GAS, ED
Acid-Base Excess: 6 mmol/L — ABNORMAL HIGH (ref 0.0–2.0)
Bicarbonate: 30.4 mmol/L — ABNORMAL HIGH (ref 20.0–28.0)
Calcium, Ion: 1.03 mmol/L — ABNORMAL LOW (ref 1.15–1.40)
HCT: 51 % — ABNORMAL HIGH (ref 36.0–46.0)
Hemoglobin: 17.3 g/dL — ABNORMAL HIGH (ref 12.0–15.0)
O2 Saturation: 93 %
Potassium: 4.6 mmol/L (ref 3.5–5.1)
Sodium: 137 mmol/L (ref 135–145)
TCO2: 32 mmol/L (ref 22–32)
pCO2, Ven: 42.9 mmHg — ABNORMAL LOW (ref 44–60)
pH, Ven: 7.459 — ABNORMAL HIGH (ref 7.25–7.43)
pO2, Ven: 62 mmHg — ABNORMAL HIGH (ref 32–45)

## 2023-11-17 LAB — LIPASE, BLOOD: Lipase: 29 U/L (ref 11–51)

## 2023-11-17 LAB — TROPONIN I (HIGH SENSITIVITY)
Troponin I (High Sensitivity): 24 ng/L — ABNORMAL HIGH (ref <18)
Troponin I (High Sensitivity): 20 ng/L — ABNORMAL HIGH (ref <18)

## 2023-11-17 LAB — MAGNESIUM: Magnesium: 2.4 mg/dL (ref 1.7–2.4)

## 2023-11-17 LAB — RESP PANEL BY RT-PCR (RSV, FLU A&B, COVID)  RVPGX2
Influenza A by PCR: NEGATIVE
Influenza B by PCR: NEGATIVE
Resp Syncytial Virus by PCR: NEGATIVE
SARS Coronavirus 2 by RT PCR: NEGATIVE

## 2023-11-17 LAB — HCG, SERUM, QUALITATIVE: Preg, Serum: NEGATIVE

## 2023-11-17 LAB — BETA-HYDROXYBUTYRIC ACID: Beta-Hydroxybutyric Acid: 0.24 mmol/L (ref 0.05–0.27)

## 2023-11-17 MED ORDER — SODIUM CHLORIDE 0.9 % IV SOLN
12.5000 mg | Freq: Once | INTRAVENOUS | Status: AC
  Administered 2023-11-17: 12.5 mg via INTRAVENOUS
  Filled 2023-11-17: qty 0.5

## 2023-11-17 MED ORDER — SODIUM CHLORIDE 0.9 % IV BOLUS
500.0000 mL | Freq: Once | INTRAVENOUS | Status: AC
  Administered 2023-11-17: 500 mL via INTRAVENOUS

## 2023-11-17 MED ORDER — HYDROMORPHONE HCL 1 MG/ML IJ SOLN
1.0000 mg | Freq: Once | INTRAMUSCULAR | Status: AC
  Administered 2023-11-17: 1 mg via INTRAVENOUS
  Filled 2023-11-17: qty 1

## 2023-11-17 MED ORDER — LABETALOL HCL 5 MG/ML IV SOLN
10.0000 mg | Freq: Once | INTRAVENOUS | Status: AC
  Administered 2023-11-17: 10 mg via INTRAVENOUS
  Filled 2023-11-17: qty 4

## 2023-11-17 NOTE — Discharge Instructions (Signed)
Take your blood pressure medications when you get home.

## 2023-11-17 NOTE — ED Notes (Signed)
 Pt reports she no longer makes urine when asked for sample.

## 2023-11-17 NOTE — ED Notes (Signed)
 MD Particia Nearing made aware of BP 90/63, order to inform pt not to take home BP medications tonight as previously instructed on DC instructions.

## 2023-11-17 NOTE — ED Notes (Signed)
 Patient transported from CT back to room via stretcher.

## 2023-11-17 NOTE — ED Notes (Signed)
 Patient verbalizes understanding of discharge instructions. Opportunity for questioning and answers were provided. Armband removed by staff, pt discharged from ED. Pt ambulatory to ED waiting room with steady gait.

## 2023-11-17 NOTE — ED Triage Notes (Signed)
 Pt presents to ED from home with reports of nausea, vomiting, and diarrhea since having her dialysis treatment. Pt having seizure-like activity upon entering ED room lasting approximately a minute. No post ictal state following. Hypoxic with EMS with sats in high 80s, placed on 2L [place]. 4 mg zofran given en route by EMS.

## 2023-11-17 NOTE — ED Notes (Signed)
 Patient transported to CT

## 2023-11-17 NOTE — ED Notes (Signed)
 Increased oxygen to 4L [place] following dip in oxygen saturation after pain medication administration. Pt still remains alert and oriented x4 but slightly drowsy.

## 2023-11-17 NOTE — ED Provider Notes (Signed)
 [facility] AT [facility] Provider Note   CSN: [id number] Arrival date & time: 11/17/23  1627     History {Add pertinent medical, surgical, social history, OB history to HPI:1} Chief Complaint  Patient presents with   Abdominal Pain   Emesis   Seizures    Kathy Lewis is a 28 y.o. female.  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  Pt was at dialysis today and developed n/v/d and abd pain during her treatment.  EMS said she had some seizure like movements en route, but no postictal state.  EMS also said RA O2 sat was in the high 80s, so she was put on 2L oxygen.  Pt has an insulin pump and said she's been compliant.         Home Medications Prior to Admission medications   Medication Sig Start Date End Date Taking? Authorizing Provider  acetaminophen (TYLENOL) 500 MG tablet Take 1,000 mg by mouth daily as needed for headache (pain).    [provider]  amitriptyline (ELAVIL) 50 MG tablet Take 1 tablet (50 mg total) by mouth at bedtime. 08/30/22   Shanna Cisco, NP  atorvastatin (LIPITOR) 40 MG tablet Take 1 tablet (40 mg total) by mouth every evening. 05/07/23   Bensimhon, Bevelyn Buckles, MD  B Complex-C-Folic Acid (RENA-VITE RX) 1 MG TABS Take 1 tablet by mouth daily. 01/01/23   [provider]  calcium acetate (PHOSLO) 667 MG capsule Take 1,334 mg by mouth 3 (three) times daily with meals. 09/28/22   [provider]  carvedilol (COREG) 3.125 MG tablet Take 1 tablet (3.125 mg total) by mouth 2 (two) times daily with a meal. 08/07/23   Amin, Ankit C, MD  clonazePAM (KLONOPIN) 0.5 MG tablet Take 1 tablet (0.5 mg total) by mouth 2 (two) times daily as needed for anxiety. Patient taking differently: Take 0.5 mg by mouth at bedtime. 03/16/22   Leroy Sea, MD  ELIQUIS 2.5 MG TABS tablet Take 2.5 mg by  mouth 2 (two) times daily.    [provider]  famotidine (PEPCID) 20 MG tablet Take 1 tablet (20 mg total) by mouth 2 (two) times daily. 07/11/23   Rexford Maus, DO  hydrOXYzine (ATARAX) 10 MG tablet Take 10 mg by mouth daily.    [provider]  insulin lispro (HUMALOG) 100 UNIT/ML injection Inject 0.5 Units into the skin See admin instructions. 0.5 units per hour via insulin pump. 01/08/22   [provider]  isosorbide mononitrate (IMDUR) 30 MG 24 hr tablet Take 0.5 tablets (15 mg total) by mouth daily. 08/08/23   Amin, Ankit C, MD  magnesium oxide (MAG-OX) 400 MG tablet Take 2 tablets (800 mg total) by mouth daily. 05/07/23   Bensimhon, Bevelyn Buckles, MD  methocarbamol (ROBAXIN) 500 MG tablet Take 500 mg by mouth as needed for muscle spasms.    [provider]  omeprazole (PRILOSEC) 20 MG capsule Take 20 mg by mouth daily. 04/11/23   [provider]  ondansetron (ZOFRAN-ODT) 4 MG disintegrating tablet Take 1 tablet (4 mg total) by mouth every 8 (eight) hours as needed for nausea or vomiting. 08/07/23   Amin, Ankit C, MD  SUMAtriptan (IMITREX) 50 MG tablet Take 50 mg by mouth daily as needed for headache.    [provider]  UBRELVY 100 MG TABS Take 100 mg  by mouth daily as needed (migraine). 07/09/23   [provider]  valACYclovir (VALTREX) 500 MG tablet Take 500 mg by mouth daily as needed (cold sores). 04/17/22   [provider]  VELPHORO 500 MG chewable tablet Chew 1,000 mg by mouth 3 (three) times daily with meals. 06/21/23   [provider]  Vitamin D, Ergocalciferol, (DRISDOL) 1.25 MG (50000 UNIT) CAPS capsule Take 50,000 Units by mouth every Sunday.    [provider]  escitalopram (LEXAPRO) 10 MG tablet Take 20 mg by mouth daily.  05/10/20 10/06/20  [provider]  furosemide (LASIX) 40 MG tablet Take 1 tablet (40 mg total) by mouth daily. Patient not taking: Reported on 02/16/2021 10/28/19  02/17/21  Geryl Rankins, MD  lisinopril (ZESTRIL) 10 MG tablet Take 10 mg by mouth daily.  02/23/20 10/06/20  [provider]  spironolactone (ALDACTONE) 25 MG tablet Take 25 mg by mouth daily.  04/20/20 10/06/20  [provider]      Allergies    Cantaloupe extract allergy skin test, Citrullus vulgaris, Food, Strawberry extract, and Nsaids    Review of Systems   Review of Systems  Gastrointestinal:  Positive for diarrhea, nausea and vomiting.  Neurological:  Positive for seizures.  All other systems reviewed and are negative.   Physical Exam Updated Vital Signs BP (!) 146/115 (BP Location: Right Arm)   Pulse 80   Temp (!) 97.4 F (36.3 C) (Axillary)   Resp 16   Ht 5' (1.524 m)   Wt 59 kg   SpO2 100%   BMI 25.39 kg/m  Physical Exam Vitals and nursing note reviewed.  Constitutional:      Appearance: She is ill-appearing.  HENT:     Head: Normocephalic and atraumatic.     Mouth/Throat:     Mouth: Mucous membranes are dry.     Pharynx: Oropharynx is clear.  Eyes:     Extraocular Movements: Extraocular movements intact.     Pupils: Pupils are equal, round, and reactive to light.  Cardiovascular:     Rate and Rhythm: Regular rhythm. Tachycardia present.  Abdominal:     General: Abdomen is flat. Bowel sounds are normal.     Palpations: Abdomen is soft.     Tenderness: There is generalized abdominal tenderness.  Skin:    General: Skin is warm.     Capillary Refill: Capillary refill takes less than 2 seconds.  Neurological:     General: No focal deficit present.     Mental Status: She is alert and oriented to person, place, and time.  Psychiatric:        Mood and Affect: Mood normal.        Behavior: Behavior normal.     ED Results / Procedures / Treatments   Labs (all labs ordered are listed, but only abnormal results are displayed) Labs Reviewed  CBC WITH DIFFERENTIAL/PLATELET - Abnormal; Notable for the following components:      Result Value    Hemoglobin 16.3 (*)    HCT 47.9 (*)    All other components within normal limits  COMPREHENSIVE METABOLIC PANEL - Abnormal; Notable for the following components:   Potassium 5.6 (*)    Chloride 95 (*)    Glucose, Bld 229 (*)    BUN 23 (*)    Creatinine, Ser 5.55 (*)    Total Protein 8.9 (*)    Alkaline Phosphatase 137 (*)    GFR, Estimated 10 (*)    Anion gap 17 (*)  All other components within normal limits  CBG MONITORING, ED - Abnormal; Notable for the following components:   Glucose-Capillary 226 (*)    All other components within normal limits  I-STAT VENOUS BLOOD GAS, ED - Abnormal; Notable for the following components:   pH, Ven 7.459 (*)    pCO2, Ven 42.9 (*)    pO2, Ven 62 (*)    Bicarbonate 30.4 (*)    Acid-Base Excess 6.0 (*)    Calcium, Ion 1.03 (*)    HCT 51.0 (*)    Hemoglobin 17.3 (*)    All other components within normal limits  TROPONIN I (HIGH SENSITIVITY) - Abnormal; Notable for the following components:   Troponin I (High Sensitivity) 24 (*)    All other components within normal limits  TROPONIN I (HIGH SENSITIVITY) - Abnormal; Notable for the following components:   Troponin I (High Sensitivity) 20 (*)    All other components within normal limits  RESP PANEL BY RT-PCR (RSV, FLU A&B, COVID)  RVPGX2  LIPASE, BLOOD  HCG, SERUM, QUALITATIVE  BETA-HYDROXYBUTYRIC ACID  MAGNESIUM  URINALYSIS, ROUTINE W REFLEX MICROSCOPIC    EKG EKG Interpretation Date/Time:  Saturday November 17 2023 16:51:31 EDT Ventricular Rate:  109 PR Interval:  168 QRS Duration:  100 QT Interval:  368 QTC Calculation: 496 R Axis:   -49  Text Interpretation: Sinus tachycardia Ventricular premature complex Biatrial enlargement LVH with secondary repolarization abnormality Prolonged QT interval Baseline wander in lead(s) V4 duplicate Confirmed by Jacalyn Lefevre [phone]) on 11/17/2023 5:04:17 PM  Radiology DG Chest Portable 1 View Result Date: 11/17/2023 CLINICAL DATA:  Shortness of  breath EXAM: PORTABLE CHEST 1 VIEW COMPARISON:  08/02/2023, 05/17/2023 FINDINGS: Right-sided central venous catheter tip at the right atrium. Globular cardiomegaly with mild central congestion. No focal opacity, pleural effusion or pneumothorax. IMPRESSION: Globular cardiomegaly with mild central congestion. Electronically Signed   By: Jasmine Pang M.D.   On: 11/17/2023 18:40    Procedures Procedures  {Document cardiac monitor, telemetry assessment procedure when appropriate:1}  Medications Ordered in ED Medications  sodium chloride 0.9 % bolus 500 mL (0 mLs Intravenous Stopped 11/17/23 1814)  promethazine (PHENERGAN) 12.5 mg in sodium chloride 0.9 % 50 mL IVPB (0 mg Intravenous Stopped 11/17/23 1814)  HYDROmorphone (DILAUDID) injection 1 mg (1 mg Intravenous Given 11/17/23 1647)  labetalol (NORMODYNE) injection 10 mg (10 mg Intravenous Given 11/17/23 1734)  HYDROmorphone (DILAUDID) injection 1 mg (1 mg Intravenous Given 11/17/23 1830)    ED Course/ Medical Decision Making/ A&P   {   Click here for ABCD2, HEART and other calculatorsREFRESH Note before signing :1}                              Medical Decision Making Amount and/or Complexity of Data Reviewed Labs: ordered. Radiology: ordered.  Risk Prescription drug management.   This patient presents to the ED for concern of n/v/d, this involves an extensive number of treatment options, and is a complaint that carries with it a high risk of complications and morbidity.  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  The pertinent results include:  pH 7.4, pCO2 42.9; cbc nl, preg neg; cbc nl; cmp nl other than k elevated at 5.6 (hemolysis); glucose elevated at 229, bun 23 and cr 5.55   Imaging Studies ordered:  I ordered imaging studies including cxr, head ct  I independently visualized and interpreted imaging which showed  CXR: Globular cardiomegaly with mild central congestion.  I agree with the radiologist interpretation   Cardiac Monitoring:  The patient was maintained on a cardiac monitor.  I personally viewed and interpreted the cardiac monitored which showed an underlying rhythm of: st   Medicines ordered and prescription drug management:  I ordered medication including ivfs/phenergan/labetalol  for sx  Reevaluation of the patient after these medicines showed that the patient improved I have reviewed the patients home medicines and have made adjustments as needed   Test Considered:  ct   Critical Interventions:  meds   Problem List / ED Course:  N/v:  likely cyclic vomiting.  She is feeling much better and is tolerating po fluids. Hyperkalemia:  hemolysis on specimen.  Pt just had dialysis today.  Hypoxia:  resolved.  O2 sat 98% on RA now.  Possibly due to cold fingers with n/v.  Hyperglycemia:  pt is not in DKA.  She has a functional insulin pump.   Reevaluation:  After the interventions noted above, I reevaluated the patient and found that they have :improved   Social Determinants of Health:  Lives at home   Dispostion:  After consideration of the diagnostic results and the patients response to treatment, I feel that the patent would benefit from discharge with outpatient f/u.    {Document critical care time when appropriate:1} {Document review of labs and clinical decision tools ie heart score, Chads2Vasc2 etc:1}  {Document your independent review of radiology images, and any outside records:1} {Document your discussion with family  members, caretakers, and with consultants:1} {Document social determinants of health affecting pt's care:1} {Document your decision making why or why not admission, treatments were needed:1} Final Clinical Impression(s) / ED Diagnoses Final diagnoses:  Nausea vomiting and diarrhea  ESRD on hemodialysis (HCC)  Hypertension, unspecified type  Hyperglycemia due to diabetes mellitus (HCC)    Rx / DC Orders ED Discharge Orders     None

## 2023-11-18 ENCOUNTER — Other Ambulatory Visit: Payer: Self-pay

## 2023-11-18 ENCOUNTER — Emergency Department (HOSPITAL_COMMUNITY)

## 2023-11-18 ENCOUNTER — Emergency Department (HOSPITAL_COMMUNITY)
Admission: EM | Admit: 2023-11-18 | Discharge: 2023-11-18 | Disposition: A | Discharge status: Home / Self Care | Attending: Student | Admitting: Student

## 2023-11-18 DIAGNOSIS — Z992 Dependence on renal dialysis: Secondary | ICD-10-CM | POA: Diagnosis not present

## 2023-11-18 DIAGNOSIS — Z794 Long term (current) use of insulin: Secondary | ICD-10-CM | POA: Diagnosis not present

## 2023-11-18 DIAGNOSIS — I132 Hypertensive heart and chronic kidney disease with heart failure and with stage 5 chronic kidney disease, or end stage renal disease: Secondary | ICD-10-CM | POA: Diagnosis not present

## 2023-11-18 DIAGNOSIS — R112 Nausea with vomiting, unspecified: Secondary | ICD-10-CM

## 2023-11-18 DIAGNOSIS — R569 Unspecified convulsions: Secondary | ICD-10-CM | POA: Diagnosis not present

## 2023-11-18 DIAGNOSIS — I509 Heart failure, unspecified: Secondary | ICD-10-CM | POA: Insufficient documentation

## 2023-11-18 DIAGNOSIS — E1122 Type 2 diabetes mellitus with diabetic chronic kidney disease: Secondary | ICD-10-CM | POA: Insufficient documentation

## 2023-11-18 DIAGNOSIS — K3184 Gastroparesis: Secondary | ICD-10-CM | POA: Insufficient documentation

## 2023-11-18 DIAGNOSIS — Z7901 Long term (current) use of anticoagulants: Secondary | ICD-10-CM | POA: Diagnosis not present

## 2023-11-18 DIAGNOSIS — Z79899 Other long term (current) drug therapy: Secondary | ICD-10-CM | POA: Diagnosis not present

## 2023-11-18 DIAGNOSIS — N186 End stage renal disease: Secondary | ICD-10-CM | POA: Insufficient documentation

## 2023-11-18 LAB — I-STAT CG4 LACTIC ACID, ED
Lactic Acid, Venous: 3.4 mmol/L (ref 0.5–1.9)
Lactic Acid, Venous: 1.7 mmol/L (ref 0.5–1.9)

## 2023-11-18 LAB — CBC
HCT: 44.6 % (ref 36.0–46.0)
Hemoglobin: 15.4 g/dL — ABNORMAL HIGH (ref 12.0–15.0)
MCH: 34.2 pg — ABNORMAL HIGH (ref 26.0–34.0)
MCHC: 34.5 g/dL (ref 30.0–36.0)
MCV: 99.1 fL (ref 80.0–100.0)
Platelets: 219 10*3/uL (ref 150–400)
RBC: 4.5 MIL/uL (ref 3.87–5.11)
RDW: 13.5 % (ref 11.5–15.5)
WBC: 6.2 10*3/uL (ref 4.0–10.5)
nRBC: 0 % (ref 0.0–0.2)

## 2023-11-18 LAB — LIPASE, BLOOD: Lipase: 40 U/L (ref 11–51)

## 2023-11-18 LAB — COMPREHENSIVE METABOLIC PANEL
ALT: 22 U/L (ref 0–44)
AST: 31 U/L (ref 15–41)
Albumin: 4.3 g/dL (ref 3.5–5.0)
Alkaline Phosphatase: 135 U/L — ABNORMAL HIGH (ref 38–126)
Anion gap: 23 — ABNORMAL HIGH (ref 5–15)
BUN: 48 mg/dL — ABNORMAL HIGH (ref 6–20)
CO2: 22 mmol/L (ref 22–32)
Calcium: 9.6 mg/dL (ref 8.9–10.3)
Chloride: 91 mmol/L — ABNORMAL LOW (ref 98–111)
Creatinine, Ser: 8.7 mg/dL — ABNORMAL HIGH (ref 0.44–1.00)
GFR, Estimated: 6 mL/min — ABNORMAL LOW (ref >60)
Glucose, Bld: 122 mg/dL — ABNORMAL HIGH (ref 70–99)
Potassium: 5.1 mmol/L (ref 3.5–5.1)
Sodium: 136 mmol/L (ref 135–145)
Total Bilirubin: 1.1 mg/dL (ref 0.0–1.2)
Total Protein: 8.4 g/dL — ABNORMAL HIGH (ref 6.5–8.1)

## 2023-11-18 LAB — CBG MONITORING, ED
Glucose-Capillary: 147 mg/dL — ABNORMAL HIGH (ref 70–99)
Glucose-Capillary: 175 mg/dL — ABNORMAL HIGH (ref 70–99)

## 2023-11-18 LAB — HCG, SERUM, QUALITATIVE: Preg, Serum: NEGATIVE

## 2023-11-18 MED ORDER — SODIUM CHLORIDE 0.9 % IV BOLUS
500.0000 mL | Freq: Once | INTRAVENOUS | Status: AC
  Administered 2023-11-18: 500 mL via INTRAVENOUS

## 2023-11-18 MED ORDER — SODIUM CHLORIDE 0.9 % IV BOLUS: 1000.0000 mL | Freq: Once | INTRAVENOUS | Status: DC

## 2023-11-18 MED ORDER — DROPERIDOL 2.5 MG/ML IJ SOLN
2.5000 mg | Freq: Once | INTRAMUSCULAR | Status: AC
  Administered 2023-11-18: 2.5 mg via INTRAVENOUS
  Filled 2023-11-18: qty 2

## 2023-11-18 MED ORDER — LORAZEPAM 2 MG/ML IJ SOLN: 2.0000 mg | Freq: Once | INTRAMUSCULAR | Status: AC

## 2023-11-18 MED ORDER — PROMETHAZINE HCL 25 MG PO TABS: 25.0000 mg | ORAL_TABLET | Freq: Four times a day (QID) | ORAL | 0 refills | Status: DC | PRN

## 2023-11-18 MED ORDER — METOPROLOL TARTRATE 5 MG/5ML IV SOLN
5.0000 mg | Freq: Once | INTRAVENOUS | Status: AC
  Administered 2023-11-18: 5 mg via INTRAVENOUS
  Filled 2023-11-18: qty 5

## 2023-11-18 MED ORDER — METOCLOPRAMIDE HCL 10 MG PO TABS: 10.0000 mg | ORAL_TABLET | Freq: Four times a day (QID) | ORAL | 0 refills | Status: AC

## 2023-11-18 MED ORDER — ONDANSETRON HCL 4 MG/2ML IJ SOLN
4.0000 mg | Freq: Once | INTRAMUSCULAR | Status: AC
  Administered 2023-11-18: 4 mg via INTRAVENOUS
  Filled 2023-11-18: qty 2

## 2023-11-18 MED ORDER — LORAZEPAM 2 MG/ML IJ SOLN
INTRAMUSCULAR | Status: AC
Start: 2023-11-18 — End: 2023-11-18
  Administered 2023-11-18: 2 mg via INTRAMUSCULAR
  Filled 2023-11-18: qty 1

## 2023-11-18 NOTE — Discharge Instructions (Addendum)
 Please use Tylenol or ibuprofen for pain.  You may use 600 mg ibuprofen every 6 hours or 1000 mg of Tylenol every 6 hours.  You may choose to alternate between the 2.  This would be most effective.  Not to exceed 4 g of Tylenol within 24 hours.  Not to exceed 3200 mg ibuprofen 24 hours.  Use the nausea medication I prescribed and the medication Reglan for abdominal pain and nausea and vomiting.  Please return if your symptoms significantly worsen, otherwise recommend that you follow-up closely with your outpatient doctors, you should receive a call to schedule an outpatient neurology consult in regards to your seizure-like activity.

## 2023-11-18 NOTE — ED Provider Notes (Signed)
 [place] EMERGENCY DEPARTMENT AT [facility] Provider Note   CSN: [id number] Arrival date & time: 11/18/23  1757     History  Chief Complaint  Patient presents with   Abdominal pain with emsis and seizure    Kathy Lewis is a 28 y.o. female with past medical history significant for poorly controlled diabetes, medication noncompliance, intractable nausea, vomiting, gastroparesis, bipolar disorder, heart failure, ESRD on dialysis who presents with ongoing abdominal pain, nausea, vomiting, and multiple seizures.  Witnessed seizure by EMS prior to arrival with reportedly around 1 to 2 minutes of postictal after 3 to 5 minutes of tonic-clonic activity  HPI     Home Medications Prior to Admission medications   Medication Sig Start Date End Date Taking? Authorizing Provider  metoCLOPramide (REGLAN) 10 MG tablet Take 1 tablet (10 mg total) by mouth every 6 (six) hours. 11/18/23  Yes Roshon Duell H, PA-C  promethazine (PHENERGAN) 25 MG tablet Take 1 tablet (25 mg total) by mouth every 6 (six) hours as needed for nausea or vomiting. 11/18/23  Yes Gedalia Mcmillon H, PA-C  acetaminophen (TYLENOL) 500 MG tablet Take 1,000 mg by mouth daily as needed for headache (pain).    [provider]  amitriptyline (ELAVIL) 50 MG tablet Take 1 tablet (50 mg total) by mouth at bedtime. 08/30/22   Shanna Cisco, NP  atorvastatin (LIPITOR) 40 MG tablet Take 1 tablet (40 mg total) by mouth every evening. 05/07/23   Bensimhon, Bevelyn Buckles, MD  B Complex-C-Folic Acid (RENA-VITE RX) 1 MG TABS Take 1 tablet by mouth daily. 01/01/23   [provider]  calcium acetate (PHOSLO) 667 MG capsule Take 1,334 mg by mouth 3 (three) times daily with meals. 09/28/22   [provider]  carvedilol (COREG) 3.125 MG tablet Take 1 tablet (3.125 mg total) by mouth 2 (two) times daily with a meal. 08/07/23   Amin, Ankit C, MD  clonazePAM (KLONOPIN) 0.5 MG tablet Take 1 tablet (0.5 mg total)  by mouth 2 (two) times daily as needed for anxiety. Patient taking differently: Take 0.5 mg by mouth at bedtime. 03/16/22   Leroy Sea, MD  ELIQUIS 2.5 MG TABS tablet Take 2.5 mg by mouth 2 (two) times daily.    [provider]  famotidine (PEPCID) 20 MG tablet Take 1 tablet (20 mg total) by mouth 2 (two) times daily. 07/11/23   Rexford Maus, DO  hydrOXYzine (ATARAX) 10 MG tablet Take 10 mg by mouth daily.    [provider]  insulin lispro (HUMALOG) 100 UNIT/ML injection Inject 0.5 Units into the skin See admin instructions. 0.5 units per hour via insulin pump. 01/08/22   [provider]  isosorbide mononitrate (IMDUR) 30 MG 24 hr tablet Take 0.5 tablets (15 mg total) by mouth daily. 08/08/23   Amin, Ankit C, MD  magnesium oxide (MAG-OX) 400 MG tablet Take 2 tablets (800 mg total) by mouth daily. 05/07/23   Bensimhon, Bevelyn Buckles, MD  methocarbamol (ROBAXIN) 500 MG tablet Take 500 mg by mouth as needed for muscle spasms.    [provider]  omeprazole (PRILOSEC) 20 MG capsule Take 20 mg by mouth daily. 04/11/23   [provider]  ondansetron (ZOFRAN-ODT) 4 MG disintegrating tablet Take 1 tablet (4 mg total) by mouth every 8 (eight) hours as needed for nausea or vomiting. 08/07/23   Amin, Ankit C, MD  SUMAtriptan (IMITREX) 50 MG tablet Take 50 mg by mouth daily as needed for headache.  [provider]  UBRELVY 100 MG TABS Take 100 mg by mouth daily as needed (migraine). 07/09/23   [provider]  valACYclovir (VALTREX) 500 MG tablet Take 500 mg by mouth daily as needed (cold sores). 04/17/22   [provider]  VELPHORO 500 MG chewable tablet Chew 1,000 mg by mouth 3 (three) times daily with meals. 06/21/23   [provider]  Vitamin D, Ergocalciferol, (DRISDOL) 1.25 MG (50000 UNIT) CAPS capsule Take 50,000 Units by mouth every Sunday.    [provider]  escitalopram (LEXAPRO) 10 MG tablet Take 20 mg by  mouth daily.  05/10/20 10/06/20  [provider]  furosemide (LASIX) 40 MG tablet Take 1 tablet (40 mg total) by mouth daily. Patient not taking: Reported on 02/16/2021 10/28/19 02/17/21  Geryl Rankins, MD  lisinopril (ZESTRIL) 10 MG tablet Take 10 mg by mouth daily.  02/23/20 10/06/20  [provider]  spironolactone (ALDACTONE) 25 MG tablet Take 25 mg by mouth daily.  04/20/20 10/06/20  [provider]      Allergies    Cantaloupe extract allergy skin test, Citrullus vulgaris, Food, Strawberry extract, and Nsaids    Review of Systems   Review of Systems  All other systems reviewed and are negative.   Physical Exam Updated Vital Signs BP (!) 155/115   Pulse (!) 111   Temp 97.6 F (36.4 C)   Resp (!) 21   Ht 5' (1.524 m)   Wt 60 kg   SpO2 100%   BMI 25.83 kg/m  Physical Exam Vitals and nursing note reviewed.  Constitutional:      General: She is not in acute distress.    Appearance: Normal appearance.  HENT:     Head: Normocephalic and atraumatic.  Eyes:     General:        Right eye: No discharge.        Left eye: No discharge.  Cardiovascular:     Rate and Rhythm: Regular rhythm. Tachycardia present.     Heart sounds: No murmur heard.    No friction rub. No gallop.  Pulmonary:     Effort: Pulmonary effort is normal.     Breath sounds: Normal breath sounds.  Abdominal:     General: Bowel sounds are normal.     Palpations: Abdomen is soft.     Comments: Diffusely ttp through abdomen  Skin:    General: Skin is warm and dry.     Capillary Refill: Capillary refill takes less than 2 seconds.  Neurological:     Mental Status: She is alert and oriented to person, place, and time.     Comments: Question Postictal period after suspected witnessed seizure, no focal neurologic deficits on evaluation after postictal period resolved. On repeat episode, clear non epileptiform movements, suspect PNES but could be atypical seizure presentation  Psychiatric:         Mood and Affect: Mood normal.        Behavior: Behavior normal.     ED Results / Procedures / Treatments   Labs (all labs ordered are listed, but only abnormal results are displayed) Labs Reviewed  COMPREHENSIVE METABOLIC PANEL - Abnormal; Notable for the following components:      Result Value   Chloride 91 (*)    Glucose, Bld 122 (*)    BUN 48 (*)    Creatinine, Ser 8.70 (*)    Total Protein 8.4 (*)    Alkaline Phosphatase 135 (*)    GFR,  Estimated 6 (*)    Anion gap 23 (*)    All other components within normal limits  CBC - Abnormal; Notable for the following components:   Hemoglobin 15.4 (*)    MCH 34.2 (*)    All other components within normal limits  I-STAT CG4 LACTIC ACID, ED - Abnormal; Notable for the following components:   Lactic Acid, Venous 3.4 (*)    All other components within normal limits  CBG MONITORING, ED - Abnormal; Notable for the following components:   Glucose-Capillary 147 (*)    All other components within normal limits  CBG MONITORING, ED - Abnormal; Notable for the following components:   Glucose-Capillary 175 (*)    All other components within normal limits  LIPASE, BLOOD  HCG, SERUM, QUALITATIVE  URINALYSIS, ROUTINE W REFLEX MICROSCOPIC  I-STAT CG4 LACTIC ACID, ED    EKG None  Radiology CT Head Wo Contrast Result Date: 11/18/2023 CLINICAL DATA:  New onset seizures. No history of trauma. Mid abdominal pain and vomiting. EXAM: CT HEAD WITHOUT CONTRAST TECHNIQUE: Contiguous axial images were obtained from the base of the skull through the vertex without intravenous contrast. RADIATION DOSE REDUCTION: This exam was performed according to the departmental dose-optimization program which includes automated exposure control, adjustment of the mA and/or kV according to patient size and/or use of iterative reconstruction technique. COMPARISON:  05/31/2023 FINDINGS: Brain: No evidence of acute infarction, hemorrhage, hydrocephalus, extra-axial  collection or mass lesion/mass effect. Vascular: No hyperdense vessel or unexpected calcification. Skull: Normal. Negative for fracture or focal lesion. Sinuses/Orbits: No acute finding. Other: None. IMPRESSION: No acute intracranial abnormalities. Electronically Signed   By: Burman Nieves M.D.   On: 11/18/2023 20:34   DG Chest Portable 1 View Result Date: 11/18/2023 CLINICAL DATA:  Shortness of breath. Mid abdominal pain and vomiting since yesterday. EXAM: PORTABLE CHEST 1 VIEW COMPARISON:  11/17/2023 FINDINGS: Right central venous catheter with tip projecting over the cavoatrial junction region. No pneumothorax. Cardiac enlargement. Mild vascular congestion. No edema or consolidation. No pleural effusion. Mediastinal contours appear intact. IMPRESSION: Cardiac enlargement with mild vascular congestion. No edema or consolidation. Electronically Signed   By: Burman Nieves M.D.   On: 11/18/2023 19:31   CT Head Wo Contrast Result Date: 11/17/2023 CLINICAL DATA:  Seizure, new onset. EXAM: CT HEAD WITHOUT CONTRAST TECHNIQUE: Contiguous axial images were obtained from the base of the skull through the vertex without intravenous contrast. RADIATION DOSE REDUCTION: This exam was performed according to the departmental dose-optimization program which includes automated exposure control, adjustment of the mA and/or kV according to patient size and/or use of iterative reconstruction technique. COMPARISON:  CT head without contrast 05/26/2023 and MR head without and with contrast 10/04/2022. FINDINGS: Brain: No acute infarct, hemorrhage, or mass lesion is present. No significant white matter lesions are present. The ventricles are of normal size. Deep brain nuclei are within normal limits. No significant extraaxial fluid collection is present. The brainstem and cerebellum are within normal limits. Midline structures are within normal limits. Vascular: No hyperdense vessel or unexpected calcification. Skull:  Calvarium is intact. No focal lytic or blastic lesions are present. No significant extracranial soft tissue lesion is present. Sinuses/Orbits: The paranasal sinuses and mastoid air cells are clear. The globes and orbits are within normal limits. IMPRESSION: Normal CT appearance of the brain. Electronically Signed   By: Marin Roberts M.D.   On: 11/17/2023 19:59   DG Chest Portable 1 View Result Date: 11/17/2023 CLINICAL DATA:  Shortness of breath  EXAM: PORTABLE CHEST 1 VIEW COMPARISON:  08/02/2023, 05/17/2023 FINDINGS: Right-sided central venous catheter tip at the right atrium. Globular cardiomegaly with mild central congestion. No focal opacity, pleural effusion or pneumothorax. IMPRESSION: Globular cardiomegaly with mild central congestion. Electronically Signed   By: Jasmine Pang M.D.   On: 11/17/2023 18:40    Procedures Procedures    Medications Ordered in ED Medications  sodium chloride 0.9 % bolus 500 mL (500 mLs Intravenous New Bag/Given 11/18/23 1840)  ondansetron (ZOFRAN) injection 4 mg (4 mg Intravenous Given 11/18/23 1839)  LORazepam (ATIVAN) injection 2 mg (2 mg Intramuscular Given 11/18/23 1814)  metoprolol tartrate (LOPRESSOR) injection 5 mg (5 mg Intravenous Given 11/18/23 1957)  droperidol (INAPSINE) 2.5 MG/ML injection 2.5 mg (2.5 mg Intravenous Given 11/18/23 2154)  sodium chloride 0.9 % bolus 500 mL (500 mLs Intravenous New Bag/Given 11/18/23 2201)    ED Course/ Medical Decision Making/ A&P                                 Medical Decision Making Amount and/or Complexity of Data Reviewed Labs: ordered. Radiology: ordered.   This patient is a 28 y.o. female  who presents to the ED for concern of abdominal pain, nausea, vomiting, seizure-like activity.   Differential diagnoses prior to evaluation: The emergent differential diagnosis includes, but is not limited to,  The causes of generalized abdominal pain include but are not limited to AAA, mesenteric ischemia,  appendicitis, diverticulitis, DKA, gastritis, gastroenteritis, AMI, nephrolithiasis, pancreatitis, peritonitis, adrenal insufficiency,lead poisoning, iron toxicity, intestinal ischemia, constipation, UTI,SBO/LBO, splenic rupture, biliary disease, IBD, IBS, PUD, or hepatitis, seizures considered acute intracranial abnormality, new mass, infection, encephalitis, meningitis, seizure disorder versus PNES, versus other.. This is not an exhaustive differential.   Past Medical History / Co-morbidities / Social History: poorly controlled diabetes, medication noncompliance, intractable nausea, vomiting, gastroparesis, bipolar disorder, heart failure, ESRD on dialysis  Additional history: Chart reviewed. Pertinent results include: reviewed labwork, imaging from recent ed visit, notable seen yesterday  Physical Exam: Physical exam performed. The pertinent findings include: Question Postictal period after suspected witnessed seizure, no focal neurologic deficits on evaluation after postictal period resolved. On repeat episode, clear non epileptiform movements, suspect PNES but could be atypical seizure presentation   Diffusely ttp through abdomen  Tachycardic with normal rhythm, her mucous membranes are dry on exam  Lab Tests/Imaging studies: I personally interpreted labs/imaging and the pertinent results include: CMP notable for elevated BUN, creatinine which her stable, known secondary to her ESRD, she has normal potassium at 5.1, mild hypochloremia at 91.  She does have an anion gap of 23 which is likely secondary to her lactic acidosis on arrival which I think is secondary to dehydration although possibly related to seizure activity.  CBC unremarkable other than mildly elevated hemoglobin which could suggest some hemoconcentration in setting of dehydration.  Normal lipase, negative pregnancy test.  Repeat lactic acid improved to 1.7 after rehydration.  On reassessment symptoms significantly improved, some  ongoing mild tachycardia but significant improvement of pain, nausea, vomiting since arrival, she additionally has some persistent diastolic hypertension, blood pressure 155/115 at discharge.. I independently interpreted CT head without contrast, plain film radiograph of the chest which shows no evidence of acute intracranial or intrathoracic abnormality. I agree with the radiologist interpretation.  Cardiac monitoring: EKG obtained and interpreted by myself and attending physician which shows: sinus tachycardia   Medications: I ordered medication including fluid bolus, droperidol.  I have reviewed the patients home medicines and have made adjustments as needed.   Disposition: After consideration of the diagnostic results and the patients response to treatment, I feel that she may benefit from neuro evaluation outpatient but given nonepileptiform activity, rapid blinking during seizure episode, I do not think any indication for further evaluation, EEG in the hospital.  Neuro referral placed, but otherwise encourage close follow-up with her primary care doctor and return to dialysis.Marland Kitchen   emergency department workup does not suggest an emergent condition requiring admission or immediate intervention beyond what has been performed at this time. The plan is: as above. The patient is safe for discharge and has been instructed to return immediately for worsening symptoms, change in symptoms or any other concerns.  Final Clinical Impression(s) / ED Diagnoses Final diagnoses:  Gastroparesis  Seizure-like activity (HCC)  Nausea and vomiting, unspecified vomiting type    Rx / DC Orders ED Discharge Orders          Ordered    promethazine (PHENERGAN) 25 MG tablet  Every 6 hours PRN        11/18/23 2247    metoCLOPramide (REGLAN) 10 MG tablet  Every 6 hours        11/18/23 2247              [place] 11/18/23 2248    Glendora Score, MD 11/19/23 1719

## 2023-11-18 NOTE — ED Triage Notes (Signed)
 According to ptar:Pt came from home complaining of mid abdominal pain with vomiting, she had this happen yesterday was seen and sent home. After breakfast she began to throw up.  Pt had seizure in out 20-30 seconds with 1-2 minutes postictal. Pt has been inconsolable since arrival of ems. Vomiting bile with white chunks, now brown emesis.  HX of dialysis, diabetes, htn.  Vitals: BP 140 systolic with no diastolic. Spo2 97 Hr 125 Cbg 142

## 2023-11-18 NOTE — ED Notes (Signed)
 Pt began to have seizure symptoms, PA christian called to bedside.

## 2023-11-18 NOTE — ED Notes (Signed)
 Patient transported to CT

## 2023-11-18 NOTE — ED Notes (Signed)
 Critical lab value 3.41, PA Christian aware face to face.

## 2023-11-18 NOTE — ED Notes (Signed)
 Kommor, [place], MD placed patient on 2 liters of oxygen nasal cannula.

## 2023-11-18 NOTE — ED Notes (Signed)
 Pt. Flagged lactic acid nicklas w.rn by at

## 2023-11-18 NOTE — ED Notes (Signed)
 Patient returned from CT

## 2023-12-17 ENCOUNTER — Other Ambulatory Visit: Payer: Self-pay

## 2023-12-17 ENCOUNTER — Emergency Department (HOSPITAL_COMMUNITY)
Admission: EM | Admit: 2023-12-17 | Discharge: 2023-12-18 | Disposition: A | Discharge status: Home / Self Care | Attending: Emergency Medicine | Admitting: Emergency Medicine

## 2023-12-17 ENCOUNTER — Encounter (HOSPITAL_COMMUNITY): Payer: Self-pay

## 2023-12-17 DIAGNOSIS — R110 Nausea: Secondary | ICD-10-CM | POA: Diagnosis present

## 2023-12-17 DIAGNOSIS — Z7901 Long term (current) use of anticoagulants: Secondary | ICD-10-CM | POA: Insufficient documentation

## 2023-12-17 DIAGNOSIS — E875 Hyperkalemia: Secondary | ICD-10-CM | POA: Diagnosis not present

## 2023-12-17 DIAGNOSIS — N186 End stage renal disease: Secondary | ICD-10-CM | POA: Diagnosis not present

## 2023-12-17 LAB — CBC WITH DIFFERENTIAL/PLATELET
Abs Immature Granulocytes: 0.02 10*3/uL (ref 0.00–0.07)
Basophils Absolute: 0.1 10*3/uL (ref 0.0–0.1)
Basophils Relative: 1 %
Eosinophils Absolute: 0.2 10*3/uL (ref 0.0–0.5)
Eosinophils Relative: 3 %
HCT: 36.9 % (ref 36.0–46.0)
Hemoglobin: 12.2 g/dL (ref 12.0–15.0)
Immature Granulocytes: 0 %
Lymphocytes Relative: 24 %
Lymphs Abs: 1.5 10*3/uL (ref 0.7–4.0)
MCH: 33.5 pg (ref 26.0–34.0)
MCHC: 33.1 g/dL (ref 30.0–36.0)
MCV: 101.4 fL — ABNORMAL HIGH (ref 80.0–100.0)
Monocytes Absolute: 0.4 10*3/uL (ref 0.1–1.0)
Monocytes Relative: 7 %
Neutro Abs: 4.1 10*3/uL (ref 1.7–7.7)
Neutrophils Relative %: 65 %
Platelets: 182 10*3/uL (ref 150–400)
RBC: 3.64 MIL/uL — ABNORMAL LOW (ref 3.87–5.11)
RDW: 13.3 % (ref 11.5–15.5)
WBC: 6.3 10*3/uL (ref 4.0–10.5)
nRBC: 0 % (ref 0.0–0.2)

## 2023-12-17 LAB — BASIC METABOLIC PANEL WITH GFR
Anion gap: 15 (ref 5–15)
BUN: 93 mg/dL — ABNORMAL HIGH (ref 6–20)
CO2: 21 mmol/L — ABNORMAL LOW (ref 22–32)
Calcium: 9.1 mg/dL (ref 8.9–10.3)
Chloride: 98 mmol/L (ref 98–111)
Creatinine, Ser: 10.29 mg/dL — ABNORMAL HIGH (ref 0.44–1.00)
GFR, Estimated: 5 mL/min — ABNORMAL LOW (ref >60)
Glucose, Bld: 108 mg/dL — ABNORMAL HIGH (ref 70–99)
Potassium: >7.5 mmol/L (ref 3.5–5.1)
Sodium: 134 mmol/L — ABNORMAL LOW (ref 135–145)

## 2023-12-17 MED ORDER — FUROSEMIDE 10 MG/ML IJ SOLN
40.0000 mg | Freq: Once | INTRAMUSCULAR | Status: AC
  Administered 2023-12-18: 40 mg via INTRAVENOUS
  Filled 2023-12-17: qty 4

## 2023-12-17 MED ORDER — DEXTROSE 50 % IV SOLN
25.0000 g | Freq: Once | INTRAVENOUS | Status: DC
  Filled 2023-12-17: qty 50

## 2023-12-17 MED ORDER — GABAPENTIN 100 MG PO CAPS
100.0000 mg | ORAL_CAPSULE | Freq: Once | ORAL | Status: AC
  Administered 2023-12-17: 100 mg via ORAL
  Filled 2023-12-17: qty 1

## 2023-12-17 MED ORDER — CHLORHEXIDINE GLUCONATE CLOTH 2 % EX PADS: 6.0000 | MEDICATED_PAD | Freq: Every day | CUTANEOUS | Status: DC

## 2023-12-17 MED ORDER — ONDANSETRON 4 MG PO TBDP
4.0000 mg | ORAL_TABLET | Freq: Once | ORAL | Status: AC
  Administered 2023-12-17: 4 mg via ORAL
  Filled 2023-12-17: qty 1

## 2023-12-17 MED ORDER — INSULIN ASPART 100 UNIT/ML IJ SOLN: 5.0000 [IU] | Freq: Once | INTRAMUSCULAR | Status: DC

## 2023-12-17 MED ORDER — SODIUM ZIRCONIUM CYCLOSILICATE 10 G PO PACK
10.0000 g | PACK | Freq: Once | ORAL | Status: AC
  Administered 2023-12-17: 10 g via ORAL
  Filled 2023-12-17: qty 1

## 2023-12-17 MED ORDER — CALCIUM GLUCONATE 10 % IV SOLN
1.0000 g | Freq: Once | INTRAVENOUS | Status: AC
Start: 2023-12-17 — End: 2023-12-18
  Administered 2023-12-18: 1 g via INTRAVENOUS
  Filled 2023-12-17: qty 10

## 2023-12-17 NOTE — ED Notes (Signed)
Attempted PIV, unsuccessful

## 2023-12-17 NOTE — ED Notes (Signed)
 Date and time results received: 12/17/23 2311 (use smartphrase ".now" to insert current time)  Test: Potassium Critical Value: greater than 7.5  Name of Provider Notified: Dr. Wallis Gun

## 2023-12-17 NOTE — ED Provider Notes (Signed)
 [facility] Provider Note   CSN: [id number] Arrival date & time: 12/17/23  2031     History {Add pertinent medical, surgical, social history, OB history to HPI:1} Chief Complaint  Patient presents with   Nausea    Kathy Lewis is a 28 y.o. female.  HPI    Patient with history of end-stage renal disease presents with nausea and burning throughout her body.  Patient reports that her dialysis session on April 19 was cut short and she only received half a treatment No other pain complaints.  No shortness of breath. She is scheduled for dialysis tomorrow morning She has no other acute complaints Home Medications Prior to Admission medications   Medication Sig Start Date End Date Taking? Authorizing Provider  acetaminophen  (TYLENOL ) 500 MG tablet Take 1,000 mg by mouth daily as needed for headache (pain).    [provider]  amitriptyline  (ELAVIL ) 50 MG tablet Take 1 tablet (50 mg total) by mouth at bedtime. 08/30/22   Arlyne Bering, NP  atorvastatin  (LIPITOR ) 40 MG tablet Take 1 tablet (40 mg total) by mouth every evening. 05/07/23   Bensimhon, Rheta Celestine, MD  B Complex-C-Folic Acid  (RENA-VITE RX) 1 MG TABS Take 1 tablet by mouth daily. 01/01/23   [provider]  calcium  acetate (PHOSLO ) 667 MG capsule Take 1,334 mg by mouth 3 (three) times daily with meals. 09/28/22   [provider]  carvedilol  (COREG ) 3.125 MG tablet Take 1 tablet (3.125 mg total) by mouth 2 (two) times daily with a meal. 08/07/23   Amin, Ankit C, MD  clonazePAM  (KLONOPIN ) 0.5 MG tablet Take 1 tablet (0.5 mg total) by mouth 2 (two) times daily as needed for anxiety. Patient taking differently: Take 0.5 mg by mouth at bedtime. 03/16/22   Singh, Prashant K, MD  ELIQUIS  2.5 MG TABS tablet Take 2.5 mg by mouth 2 (two) times daily.    [provider]  famotidine  (PEPCID ) 20 MG tablet Take 1 tablet (20 mg total) by mouth 2 (two) times daily.  07/11/23   Kingsley, Victoria K, DO  hydrOXYzine  (ATARAX ) 10 MG tablet Take 10 mg by mouth daily.    [provider]  insulin  lispro (HUMALOG ) 100 UNIT/ML injection Inject 0.5 Units into the skin See admin instructions. 0.5 units per hour via insulin  pump. 01/08/22   [provider]  isosorbide  mononitrate (IMDUR ) 30 MG 24 hr tablet Take 0.5 tablets (15 mg total) by mouth daily. 08/08/23   Amin, Ankit C, MD  magnesium  oxide (MAG-OX) 400 MG tablet Take 2 tablets (800 mg total) by mouth daily. 05/07/23   Bensimhon, Rheta Celestine, MD  methocarbamol  (ROBAXIN ) 500 MG tablet Take 500 mg by mouth as needed for muscle spasms.    [provider]  metoCLOPramide  (REGLAN ) 10 MG tablet Take 1 tablet (10 mg total) by mouth every 6 (six) hours. 11/18/23   Prosperi, Christian H, PA-C  omeprazole (PRILOSEC) 20 MG capsule Take 20 mg by mouth daily. 04/11/23   [provider]  ondansetron  (ZOFRAN -ODT) 4 MG disintegrating tablet Take 1 tablet (4 mg total) by mouth every 8 (eight) hours as needed for nausea or vomiting. 08/07/23   Maggie Schooner, MD  promethazine  (PHENERGAN ) 25 MG tablet Take 1 tablet (25 mg total) by mouth every 6 (six) hours as needed for nausea or vomiting. 11/18/23   Prosperi, Christian H, PA-C  SUMAtriptan  (IMITREX ) 50 MG tablet Take 50 mg by mouth daily as needed for  headache.    [provider]  UBRELVY  100 MG TABS Take 100 mg by mouth daily as needed (migraine). 07/09/23   [provider]  valACYclovir  (VALTREX ) 500 MG tablet Take 500 mg by mouth daily as needed (cold sores). 04/17/22   [provider]  VELPHORO  500 MG chewable tablet Chew 1,000 mg by mouth 3 (three) times daily with meals. 06/21/23   [provider]  Vitamin D , Ergocalciferol , (DRISDOL ) 1.25 MG (50000 UNIT) CAPS capsule Take 50,000 Units by mouth every Sunday.    [provider]  escitalopram  (LEXAPRO ) 10 MG tablet Take 20 mg by mouth daily.  05/10/20 10/06/20   [provider]  furosemide  (LASIX ) 40 MG tablet Take 1 tablet (40 mg total) by mouth daily. Patient not taking: Reported on 02/16/2021 10/28/19 02/17/21  Varnado, Evelyn, MD  lisinopril  (ZESTRIL ) 10 MG tablet Take 10 mg by mouth daily.  02/23/20 10/06/20  [provider]  spironolactone  (ALDACTONE ) 25 MG tablet Take 25 mg by mouth daily.  04/20/20 10/06/20  [provider]      Allergies    Cantaloupe extract allergy skin test, Citrullus vulgaris, Food, Strawberry extract, and Nsaids    Review of Systems   Review of Systems  Gastrointestinal:  Positive for nausea.    Physical Exam Updated Vital Signs BP (!) 148/74 (BP Location: Left Arm)   Pulse 94   Temp 98.2 F (36.8 C) (Oral)   Resp 16   Ht 1.524 m (5')   Wt 65.8 kg   LMP 12/17/2019 (Approximate)   SpO2 98%   BMI 28.32 kg/m  Physical Exam CONSTITUTIONAL: Chronically ill-appearing, no acute distress HEAD: Normocephalic/atraumatic EYES: EOMI/PERRL ENMT: Mucous membranes moist NECK: supple no meningeal signs CV: S1/S2 noted LUNGS: Lungs are clear to auscultation bilaterally, no apparent distress ABDOMEN: soft NEURO: Pt is awake/alert/appropriate, moves all extremitiesx4.  No facial droop.   EXTREMITIES:  full ROM SKIN: warm, color normal Dialysis catheter to right upper chest PSYCH: no abnormalities of mood noted, alert and oriented to situation  ED Results / Procedures / Treatments   Labs (all labs ordered are listed, but only abnormal results are displayed) Labs Reviewed  CBC WITH DIFFERENTIAL/PLATELET - Abnormal; Notable for the following components:      Result Value   RBC 3.64 (*)    MCV 101.4 (*)    All other components within normal limits  BASIC METABOLIC PANEL WITH GFR - Abnormal; Notable for the following components:   Sodium 134 (*)    Potassium >7.5 (*)    CO2 21 (*)    Glucose, Bld 108 (*)    BUN 93 (*)    Creatinine, Ser 10.29 (*)    GFR, Estimated 5 (*)    All other  components within normal limits    EKG ED ECG REPORT   Date: 12/17/2023 2317  Rate: 91  Rhythm: normal sinus rhythm  QRS Axis: left  Intervals: QT prolonged  ST/T Wave abnormalities: nonspecific T wave changes  Conduction Disutrbances:nonspecific intraventricular conduction delay  Narrative Interpretation:   Old EKG Reviewed: changes noted  I have personally reviewed the EKG tracing and agree with the computerized printout as noted.   Radiology No results found.  Procedures .Critical Care  Performed by: Eldon Greenland, MD Authorized by: Eldon Greenland, MD   Critical care provider statement:    Critical care start time:  12/17/2023 11:23 PM   Critical care end time:  12/17/2023 11:23 PM   Critical care time was  exclusive of:  Separately billable procedures and treating other patients   Critical care was necessary to treat or prevent imminent or life-threatening deterioration of the following conditions:  Metabolic crisis and renal failure   Critical care was time spent personally by me on the following activities:  Examination of patient, evaluation of patient's response to treatment, discussions with consultants, development of treatment plan with patient or surrogate, re-evaluation of patient's condition, ordering and review of laboratory studies and ordering and performing treatments and interventions   I assumed direction of critical care for this patient from another provider in my specialty: no     {Document cardiac monitor, telemetry assessment procedure when appropriate:1}  Medications Ordered in ED Medications  furosemide  (LASIX ) injection 40 mg (has no administration in time range)  sodium zirconium cyclosilicate  (LOKELMA ) packet 10 g (has no administration in time range)  calcium  gluconate inj 10% (1 g) URGENT USE ONLY! (has no administration in time range)  gabapentin  (NEURONTIN ) capsule 100 mg (100 mg Oral Given 12/17/23 2205)  ondansetron  (ZOFRAN -ODT)  disintegrating tablet 4 mg (4 mg Oral Given 12/17/23 2204)    ED Course/ Medical Decision Making/ A&P   {   Click here for ABCD2, HEART and other calculatorsREFRESH Note before signing :1}                              Medical Decision Making Amount and/or Complexity of Data Reviewed ECG/medicine tests: ordered.  Risk Prescription drug management.   This patient presents to the ED for concern of nausea, this involves an extensive number of treatment options, and is a complaint that carries with it a high risk of complications and morbidity.  The differential diagnosis includes but is not limited to  gastritis, peptic ulcer disease, appendicitis, bowel obstruction, metabolic derangement, acute coronary syndrome    Comorbidities that complicate the patient evaluation: Patient's presentation is complicated by their history of ESRD  Social Determinants of Health: Patient's  multiple ER visits   increases the complexity of managing their presentation  Additional history obtained: Records reviewed previous admission documents  Lab Tests: I Ordered, and personally interpreted labs.  The pertinent results include:  hyperkalemia, renal failure   Cardiac Monitoring: The patient was maintained on a cardiac monitor.  I personally viewed and interpreted the cardiac monitor which showed an underlying rhythm of:  sinus rhythm  Medicines ordered and prescription drug management: I ordered medication including calcium   for hyperkalemia  Reevaluation of the patient after these medicines showed that the patient    {resolved/improved/worsened:23923::"improved"}  Test Considered: Patient is low risk / negative by ***, therefore do not feel that *** is indicated.  Critical Interventions:  ***  Consultations Obtained: I requested consultation with the {consultation:26851}, and discussed  findings as well as pertinent plan - they recommend: ***  Reevaluation: After the interventions noted  above, I reevaluated the patient and found that they have :{resolved/improved/worsened:23923::"improved"}  Complexity of problems addressed: Patient's presentation is most consistent with  acute presentation with potential threat to life or bodily function  Disposition: After consideration of the diagnostic results and the patient's response to treatment,  I feel that the patent would benefit from admission   .     {Document critical care time when appropriate:1} {Document review of labs and clinical decision tools ie heart score, Chads2Vasc2 etc:1}  {Document your independent review of radiology images, and any outside records:1} {Document your discussion with family members, caretakers,  and with consultants:1} {Document social determinants of health affecting pt's care:1} {Document your decision making why or why not admission, treatments were needed:1} Final Clinical Impression(s) / ED Diagnoses Final diagnoses:  None    Rx / DC Orders ED Discharge Orders     None

## 2023-12-17 NOTE — Progress Notes (Signed)
 Informed of patient in ER. Presents with nausea. K >7.5 with widened QRS on EKG. To receive med mgmt. ESRD  on TTS sched. Dialyzes at [facility]. Plan for HD tonight, priority. Discussed with HD unit. Please Informed us  if patient is to be admitted for formal consult. Please call with any questions/concerns in the interim.  Cristi Donalds, MD [facility]

## 2023-12-17 NOTE — ED Triage Notes (Signed)
 Pt arrived from home via [facility] c/o nausea that began today. Pt states that on Sat she only got half a tx at dialysis. Pt states that she has a burning sensation all over

## 2023-12-18 DIAGNOSIS — E875 Hyperkalemia: Secondary | ICD-10-CM | POA: Diagnosis not present

## 2023-12-18 LAB — CBG MONITORING, ED
Glucose-Capillary: 113 mg/dL — ABNORMAL HIGH (ref 70–99)
Glucose-Capillary: 54 mg/dL — ABNORMAL LOW (ref 70–99)

## 2023-12-18 LAB — HEPATITIS PANEL, ACUTE
HCV Ab: NONREACTIVE
Hep A IgM: NONREACTIVE
Hep B C IgM: NONREACTIVE
Hepatitis B Surface Ag: NONREACTIVE

## 2023-12-18 LAB — HEPATITIS B SURFACE ANTIGEN: Hepatitis B Surface Ag: NONREACTIVE

## 2023-12-18 MED ORDER — HEPARIN SODIUM (PORCINE) 1000 UNIT/ML IJ SOLN: 4000.0000 [IU] | Freq: Once | INTRAMUSCULAR | Status: DC

## 2023-12-18 MED ORDER — FENTANYL CITRATE PF 50 MCG/ML IJ SOSY
50.0000 ug | PREFILLED_SYRINGE | Freq: Once | INTRAMUSCULAR | Status: AC
  Administered 2023-12-18: 50 ug via INTRAVENOUS
  Filled 2023-12-18: qty 1

## 2023-12-18 NOTE — Discharge Instructions (Addendum)
 Return for any problem.  ?

## 2023-12-18 NOTE — ED Provider Notes (Signed)
 Patient has completed dialysis session.   She now desires DC.   Importance of close FU stressed. Strict return precautions given and understood.    Burnette Carte, MD 12/18/23 [phone]

## 2023-12-18 NOTE — Progress Notes (Signed)
   12/18/23 0615  Vitals  Temp 97.6 F (36.4 C)  Temp Source Oral  BP 121/84  MAP (mmHg) 96  Pulse Rate 84  ECG Heart Rate 84  Resp 16  Oxygen Therapy  SpO2 100 %  O2 Device Nasal Cannula  O2 Flow Rate (L/min) 2 L/min  During Treatment Monitoring  Blood Flow Rate (mL/min) 0 mL/min  Arterial Pressure (mmHg) -1.41 mmHg  Venous Pressure (mmHg) -1.01 mmHg  TMP (mmHg) -50.3 mmHg  Ultrafiltration Rate (mL/min) 1519 mL/min  Dialysate Flow Rate (mL/min) 299 ml/min  Duration of HD Treatment -hour(s) 3.08 hour(s)  Cumulative Fluid Removed (mL) per Treatment  3700.32  HD Safety Checks Performed Yes  Intra-Hemodialysis Comments Tx completed  Post Treatment  Dialyzer Clearance Lightly streaked  Liters Processed 74  Fluid Removed (mL) 3700 mL  Tolerated HD Treatment Yes  Post-Hemodialysis Comments pt stable   Pt toleratered ntreatment without complications.catheter dressing changed, biopatch applied, heparin  1:1000  instill 2.74ml each ports.capped and clamped.

## 2023-12-19 LAB — HEPATITIS B SURFACE ANTIBODY, QUANTITATIVE: Hep B S AB Quant (Post): 435 m[IU]/mL

## 2023-12-31 ENCOUNTER — Encounter (HOSPITAL_COMMUNITY): Payer: Self-pay

## 2023-12-31 ENCOUNTER — Ambulatory Visit (HOSPITAL_COMMUNITY)
Admission: RE | Admit: 2023-12-31 | Discharge: 2023-12-31 | Disposition: A | Discharge status: Home / Self Care | Source: Ambulatory Visit | Attending: Physician Assistant | Admitting: Physician Assistant

## 2023-12-31 VITALS — BP 120/88 | HR 84 | Ht 60.0 in | Wt 139.6 lb

## 2023-12-31 DIAGNOSIS — I10 Essential (primary) hypertension: Secondary | ICD-10-CM | POA: Diagnosis not present

## 2023-12-31 DIAGNOSIS — Z3A49 Greater than 42 weeks gestation of pregnancy: Secondary | ICD-10-CM

## 2023-12-31 DIAGNOSIS — N186 End stage renal disease: Secondary | ICD-10-CM | POA: Insufficient documentation

## 2023-12-31 DIAGNOSIS — O903 Peripartum cardiomyopathy: Secondary | ICD-10-CM

## 2023-12-31 DIAGNOSIS — E875 Hyperkalemia: Secondary | ICD-10-CM | POA: Insufficient documentation

## 2023-12-31 DIAGNOSIS — I5043 Acute on chronic combined systolic (congestive) and diastolic (congestive) heart failure: Secondary | ICD-10-CM | POA: Diagnosis not present

## 2023-12-31 DIAGNOSIS — Z794 Long term (current) use of insulin: Secondary | ICD-10-CM | POA: Diagnosis not present

## 2023-12-31 DIAGNOSIS — E1022 Type 1 diabetes mellitus with diabetic chronic kidney disease: Secondary | ICD-10-CM | POA: Insufficient documentation

## 2023-12-31 DIAGNOSIS — I132 Hypertensive heart and chronic kidney disease with heart failure and with stage 5 chronic kidney disease, or end stage renal disease: Secondary | ICD-10-CM | POA: Insufficient documentation

## 2023-12-31 DIAGNOSIS — Z87891 Personal history of nicotine dependence: Secondary | ICD-10-CM | POA: Diagnosis not present

## 2023-12-31 DIAGNOSIS — Z79899 Other long term (current) drug therapy: Secondary | ICD-10-CM | POA: Diagnosis not present

## 2023-12-31 DIAGNOSIS — I5022 Chronic systolic (congestive) heart failure: Secondary | ICD-10-CM | POA: Insufficient documentation

## 2023-12-31 DIAGNOSIS — Z7901 Long term (current) use of anticoagulants: Secondary | ICD-10-CM | POA: Insufficient documentation

## 2023-12-31 NOTE — Patient Instructions (Signed)
 Medication Changes:  PLEASE CALL OUR OFFICE WHEN YOU GET HOME TO LET US  KNOW WHAT MEDICATIONS YOU ARE TAKING   ECHOCARDIOGRAM AS SCHEDULED   Follow-Up in: 3 MONTHS AS SCHEDULED   At [facility], you and your health needs are our priority. We have a designated team specialized in the treatment of Heart Failure. This Care Team includes your primary Heart Failure Specialized Cardiologist (physician), Advanced Practice Providers (APPs- Physician Assistants and Nurse Practitioners), and Pharmacist who all work together to provide you with the care you need, when you need it.   You may see any of the following providers on your designated Care Team at your next follow up:  Dr. Jules Oar Dr. Peder Bourdon Dr. Alwin Baars Dr. Judyth Nunnery Nieves Bars, NP Ruddy Corral, [place] [facility] [place], [place] Dennise Fitz, NP [place] Lee, NP Luster Salters, PharmD   Please be sure to bring in all your medications bottles to every appointment.   Need to Contact Us :  If you have any questions or concerns before your next appointment please send us  a message through [place] or call our office at [phone].    TO LEAVE A MESSAGE FOR THE NURSE SELECT OPTION 2, PLEASE LEAVE A MESSAGE INCLUDING: YOUR NAME DATE OF BIRTH CALL BACK NUMBER REASON FOR CALL**this is important as we prioritize the call backs  YOU WILL RECEIVE A CALL BACK THE SAME DAY AS LONG AS YOU CALL BEFORE 4:00 PM

## 2023-12-31 NOTE — Progress Notes (Addendum)
 ADVANCED HF CLINIC NOTE   PCP: Belva Boyden, PA Primary Cardiologist: Dr. Julane Ny  Chief complaint: Heart failure follow up  HPI: Kathy Lewis is a 28 y.o. 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 Has been followed at [facility] AHF team but work-up complicated by lack of f/o and compliance issues.    Admitted to [facility] and underwent RHC with markedly elevated filling pressures and low output. Started on inotropes. Also underwent CVVHD for several days but refused conversion to PD. Left AMA.   Echo 3/23 EF 20% RV moderately reduced   Admitted 1/24 for HF decompensation. PD not working. Switched to HD.  I saw her in clinic 1/24 and referred to [facility] for heart/kidney/pancreas transplant eval. Cancelled appointment due to COVID  Admitted 10/04/22 with intractable n/v and HTN urgency. Head CT with small chronic appearing right superior cerebellar infarct. No acute changes. Developed hypoglycemia and persistent uncontrolled hypertension. Admitted to the ICU for clevidipine  infusion. Oral meds adjusted  Echo 10/05/22 EF < 20% RV moderately reduced. Mild MR  Seen in ED 10/22/22 with COVID. Left AMA  Returned to ED 11/14/22 with abdominal pain. Given IV fluids, reglan , and dilaudid . She left ED.   (Had normal gastric emptying study in 6/23)  Was seen at [facility] for heart-kidney transplant evaluation. CPX 4/24. pVO2 10.7 (34%) VE/VCO2 36 RER 0.92. Has been deferred for now until she can prove better compliance.  Admitted 6/24 with N/V.   Echo 05/07/23: EF 20-25%   She's had multiple recent ED visits and admissions for abdominal pain, nausea and vomiting. Readmitted with abdominal pain. GI previously felt may be cyclical vomiting syndrome +/- abdominal congestion from CHF. Entresto  held with hyperkalemia. Planned to to lower dry weight as tolerated.  Admitted 12/24 with UTI and N/V. Coreg  reduced and hydralazine  was  stopped.  Seen in ED 12/17/23 with nausea after HD had been cut short. K was > 7.5, received emergent HD and given lokelma , IV lasix  and IV calcium .   Here today for CHF follow-up. She was seen by Advanced Heart Failure at [facility] last week and is planning to undergo another transplant evaluation now that she has demonstrated better compliance. Overall has been doing well. Notes her blood pressure may drop to 70s systolic during HD. Sometimes sessions must be stopped earlier and get gets fluid back. Does not hold any blood pressure medications day of HD. On nonHD days her blood pressure runs 120s-130s/90s-100s. Denies dyspnea, orthopnea, PND or lower extremity edema. Continues with chronic fatigue.  Her father helps with caring for her young daughter.  ROS: All systems negative except as listed in HPI, PMH and Problem List.  SH:  Social History   Socioeconomic History   Marital status: Single    Spouse name: Not on file   Number of children: 1   Years of education: Not on file   Highest education level: Not on file  Occupational History   Occupation: disabled  Tobacco Use   Smoking status: Former    Current packs/day: 1.00    Average packs/day: 1 pack/day for 2.0 years (2.0 ttl pk-yrs)    Types: Cigars, Cigarettes   Smokeless tobacco: Never  Vaping Use   Vaping status: Never Used  Substance and Sexual Activity   Alcohol use: No   Drug use: No   Sexual activity: Yes    Birth control/protection: None  Other Topics Concern   Not on file  Social History Narrative   Not on file   Social Drivers of Health   Financial Resource Strain: Low Risk  (04/19/2023)   Received from [facility]   Overall Financial Resource Strain (CARDIA)    Difficulty of Paying Living Expenses: Not hard at all  Food Insecurity: Patient Unable To Answer (08/03/2023)   Hunger Vital Sign    Worried About Programme researcher, broadcasting/film/video in the Last Year: Patient unable to answer    Ran Out of Food in the Last Year:  Patient unable to answer  Transportation Needs: No Transportation Needs (07/13/2023)   PRAPARE - Administrator, Civil Service (Medical): No    Lack of Transportation (Non-Medical): No  Recent Concern: Transportation Needs - Unmet Transportation Needs (04/14/2023)   Received from [facility]   Transportation    In the past 12 months, has lack of reliable transportation kept you from medical appointments, meetings, work or from getting things needed for daily living? : Yes  Physical Activity: Unknown (04/19/2023)   Received from [facility]   Exercise Vital Sign    Days of Exercise per Week: 0 days    Minutes of Exercise per Session: Not on file  Stress: No Stress Concern Present (04/19/2023)   Received from [facility] - Occupational Stress Questionnaire    Feeling of Stress : Not at all  Social Connections: Moderately Integrated (04/19/2023)   Received from [facility]   Social Network    How would you rate your social network (family, work, friends)?: Adequate participation with social networks  Intimate Partner Violence: Unknown (08/03/2023)   Humiliation, Afraid, Rape, and Kick questionnaire    Fear of Current or Ex-Partner: Patient declined    Emotionally Abused: No    Physically Abused: No    Sexually Abused: No    FH:  Family History  Adopted: Yes  Problem Relation Age of Onset   Heart disease Neg Hx     Past Medical History:  Diagnosis Date   Anemia    Anxiety    Bipolar 2 disorder (HCC)    Chronic kidney disease    Chronic systolic (congestive) heart failure (HCC)    Depression    DKA (diabetic ketoacidoses)    ESRD on peritoneal dialysis (HCC)    HSV infection    on valtrex    Hypokalemia    Leukocytosis    Migraine    Noncompliance with medication regimen    Preeclampsia    Prolonged QT syndrome    Severe anemia    Type 1 diabetes mellitus (HCC)     Current Outpatient Medications  Medication  Sig Dispense Refill   acetaminophen  (TYLENOL ) 500 MG tablet Take 1,000 mg by mouth daily as needed for headache (pain).     amitriptyline  (ELAVIL ) 50 MG tablet Take 1 tablet (50 mg total) by mouth at bedtime. 30 tablet 3   atorvastatin  (LIPITOR ) 40 MG tablet Take 1 tablet (40 mg total) by mouth every evening. 30 tablet 11   B Complex-C-Folic Acid  (RENA-VITE RX) 1 MG TABS Take 1 tablet by mouth daily.     calcium  acetate (PHOSLO ) 667 MG capsule Take 1,334 mg by mouth 3 (three) times daily with meals.     carvedilol  (COREG ) 3.125 MG tablet Take 1 tablet (3.125 mg total) by mouth 2 (two) times daily with a meal. 60 tablet 0   clonazePAM  (KLONOPIN ) 0.5 MG tablet Take 1 tablet (0.5 mg total) by mouth  2 (two) times daily as needed for anxiety. (Patient taking differently: Take 0.5 mg by mouth at bedtime.) 20 tablet 0   ELIQUIS  2.5 MG TABS tablet Take 2.5 mg by mouth 2 (two) times daily.     hydrOXYzine  (ATARAX ) 10 MG tablet Take 10 mg by mouth daily.     insulin  lispro (HUMALOG ) 100 UNIT/ML injection Inject 0.5 Units into the skin See admin instructions. 0.5 units per hour via insulin  pump.     isosorbide  mononitrate (IMDUR ) 30 MG 24 hr tablet Take 0.5 tablets (15 mg total) by mouth daily. 30 tablet 0   magnesium  oxide (MAG-OX) 400 MG tablet Take 2 tablets (800 mg total) by mouth daily. 60 tablet 6   methocarbamol  (ROBAXIN ) 500 MG tablet Take 500 mg by mouth as needed for muscle spasms.     metoCLOPramide  (REGLAN ) 10 MG tablet Take 1 tablet (10 mg total) by mouth every 6 (six) hours. 30 tablet 0   omeprazole (PRILOSEC) 20 MG capsule Take 20 mg by mouth daily.     SUMAtriptan  (IMITREX ) 50 MG tablet Take 50 mg by mouth daily as needed for headache.     UBRELVY  100 MG TABS Take 100 mg by mouth daily as needed (migraine).     valACYclovir  (VALTREX ) 500 MG tablet Take 500 mg by mouth daily as needed (cold sores).     VELPHORO  500 MG chewable tablet Chew 1,000 mg by mouth 3 (three) times daily with meals.      Vitamin D , Ergocalciferol , (DRISDOL ) 1.25 MG (50000 UNIT) CAPS capsule Take 50,000 Units by mouth every Sunday.     No current facility-administered medications for this encounter.    Vitals:   12/31/23 1400  BP: 120/88  Pulse: 84  SpO2: 99%  Weight: 63.3 kg (139 lb 9.6 oz)  Height: 5' (1.524 m)     Wt Readings from Last 3 Encounters:  12/31/23 63.3 kg (139 lb 9.6 oz)  12/18/23 68.5 kg (151 lb 0.2 oz)  11/18/23 60 kg (132 lb 4.4 oz)    PHYSICAL EXAM: General:  Well appearing Neck: no JVD.  Cor: Regular rate & rhythm. No rubs, gallops or murmurs. Lungs: clear Abdomen: soft, nontender, nondistended.  Extremities: trace edema Neuro: alert & orientedx3. Affect pleasant    ASSESSMENT & PLAN: 1. Chronic systolic heart failure - Hx Peripartum CM 03/2020. RHC 07/29/20:  CO low-normal by TD (CO/CI) Thermodilution: 4.07/2.59) and normal cardiac output by Fick (CO/CI Fick: 4.7/3.0) ECHOs 21' suggested evidence of possible non-compaction, but cMRI 13' showed no evidence of infarction, myocarditis, or infiltrative patterns. TTE on 07/30/20 with LVEF 35-40% but declined to 25-30% around 04/2021 likely in the setting of noncompliance with medical therapies.  - Echo 10/2021: EF of 20%. Small pericardial effusion. Indeterminate diastolic function   - Echo 08/14/22: EF 13%, mod MR, severe TR, PASP ~50, small effusion - RHC at [facility] 12/23: RA mean, PA mean 56, PCWP 39, PA sat 56%, Fick CI 1.7, Thermo CI 1.9 - Echo 10/05/22 EF < 20% RV moderately reduced. Mild MR - CPX 4/24. pVO2 10.7 (34%) VE/VCO2 36 RER 0.92. - Echo 05/07/23: EF 20-25%  - Echo 10/24: EF 20-25%, RVSP 48 mmHg, moderate to severe TR - Previously followed by AHF team at [facility] but has switched care here - Improved NYHA II. Volume looks good today - Unclear what doses of carvedilol , hydralazine  and imdur  she is taking. Med lists here and at [facility] are different. She will all us  when she gets home to  confirm medications. Cannot restart  Entresto  d/t recent hyperkalemia. - Has been seen at [facility] for heart-kidney transplant in 4/24. She has been turned down d/t noncompliance. She is now doing better with this and has good support from family. Just saw Dr. Lydia Sams at [facility] and planning repeat evaluation for transplant.  - Repeat echo  2. End stage renal disease  - Volume status much improved on HD - Some low BP post HD, will likely need to hold some meds on HD days once med list confirmed. HD intermittently stopped early d/t hypotension. - Planning transplant workup at [facility] as above  3. DMI - Follows with Endocrine - Hgb A1c 8.4% 10/24  4. HTN - BP controlled in clinic but has been elevated at home on nonHD days.  - See discussion above regarding meds  5. N/V - suspect DM gastroparesis but emyptying study ok in 6/23 - RHC did not support low output state - GI suspected possible cyclical vomiting syndrome  6. RIJ DVT - on Eliquis  2.5 bid  7. RA density -Linear mobile density noted in RA on echoes in 10/24, 09/24 and 02/24. Possible thrombin sheath from prior line insertion. No evidence of endocarditis.  8. Hyperkalemia - K recently > 7 in 04/25. Had emergent HD. Reports that she had labs with Nephrology at [facility] after this and K remained elevated - Will hold off on restarting Entresto   Follow-up 3 months   Emmy Keng N, PA-C  5:09 PM

## 2024-01-01 NOTE — Addendum Note (Signed)
 Encounter addended by: Davonna Ertl B, RN on: 01/01/2024 10:29 AM  Actions taken: Visit diagnoses modified, Order list changed, Diagnosis association updated

## 2024-01-22 ENCOUNTER — Other Ambulatory Visit: Payer: Self-pay | Admitting: Vascular Surgery

## 2024-01-22 DIAGNOSIS — N186 End stage renal disease: Secondary | ICD-10-CM

## 2024-01-26 ENCOUNTER — Emergency Department (HOSPITAL_COMMUNITY)

## 2024-01-26 ENCOUNTER — Inpatient Hospital Stay (HOSPITAL_COMMUNITY)
Admission: EM | Admit: 2024-01-26 | Discharge: 2024-01-31 | DRG: 100 | Disposition: A | Discharge status: Home / Self Care | Attending: Internal Medicine | Admitting: Internal Medicine

## 2024-01-26 ENCOUNTER — Encounter (HOSPITAL_COMMUNITY): Payer: Self-pay

## 2024-01-26 DIAGNOSIS — K219 Gastro-esophageal reflux disease without esophagitis: Secondary | ICD-10-CM | POA: Diagnosis present

## 2024-01-26 DIAGNOSIS — Z8673 Personal history of transient ischemic attack (TIA), and cerebral infarction without residual deficits: Secondary | ICD-10-CM

## 2024-01-26 DIAGNOSIS — E10649 Type 1 diabetes mellitus with hypoglycemia without coma: Secondary | ICD-10-CM | POA: Diagnosis not present

## 2024-01-26 DIAGNOSIS — Z992 Dependence on renal dialysis: Secondary | ICD-10-CM

## 2024-01-26 DIAGNOSIS — D631 Anemia in chronic kidney disease: Secondary | ICD-10-CM | POA: Diagnosis present

## 2024-01-26 DIAGNOSIS — Z79624 Long term (current) use of inhibitors of nucleotide synthesis: Secondary | ICD-10-CM

## 2024-01-26 DIAGNOSIS — B009 Herpesviral infection, unspecified: Secondary | ICD-10-CM | POA: Diagnosis present

## 2024-01-26 DIAGNOSIS — Z8679 Personal history of other diseases of the circulatory system: Secondary | ICD-10-CM

## 2024-01-26 DIAGNOSIS — I953 Hypotension of hemodialysis: Secondary | ICD-10-CM | POA: Diagnosis not present

## 2024-01-26 DIAGNOSIS — I160 Hypertensive urgency: Secondary | ICD-10-CM | POA: Diagnosis present

## 2024-01-26 DIAGNOSIS — Z86718 Personal history of other venous thrombosis and embolism: Secondary | ICD-10-CM

## 2024-01-26 DIAGNOSIS — Z7901 Long term (current) use of anticoagulants: Secondary | ICD-10-CM

## 2024-01-26 DIAGNOSIS — R9431 Abnormal electrocardiogram [ECG] [EKG]: Secondary | ICD-10-CM | POA: Diagnosis present

## 2024-01-26 DIAGNOSIS — N2581 Secondary hyperparathyroidism of renal origin: Secondary | ICD-10-CM | POA: Diagnosis present

## 2024-01-26 DIAGNOSIS — Z9889 Other specified postprocedural states: Secondary | ICD-10-CM

## 2024-01-26 DIAGNOSIS — K3184 Gastroparesis: Secondary | ICD-10-CM | POA: Diagnosis present

## 2024-01-26 DIAGNOSIS — F3181 Bipolar II disorder: Secondary | ICD-10-CM | POA: Diagnosis present

## 2024-01-26 DIAGNOSIS — E1043 Type 1 diabetes mellitus with diabetic autonomic (poly)neuropathy: Secondary | ICD-10-CM | POA: Diagnosis present

## 2024-01-26 DIAGNOSIS — N186 End stage renal disease: Principal | ICD-10-CM

## 2024-01-26 DIAGNOSIS — R569 Unspecified convulsions: Principal | ICD-10-CM

## 2024-01-26 DIAGNOSIS — I5022 Chronic systolic (congestive) heart failure: Secondary | ICD-10-CM | POA: Diagnosis present

## 2024-01-26 DIAGNOSIS — E875 Hyperkalemia: Secondary | ICD-10-CM | POA: Diagnosis not present

## 2024-01-26 DIAGNOSIS — G43909 Migraine, unspecified, not intractable, without status migrainosus: Secondary | ICD-10-CM | POA: Diagnosis present

## 2024-01-26 DIAGNOSIS — E1065 Type 1 diabetes mellitus with hyperglycemia: Secondary | ICD-10-CM | POA: Diagnosis present

## 2024-01-26 DIAGNOSIS — I82C11 Acute embolism and thrombosis of right internal jugular vein: Secondary | ICD-10-CM | POA: Diagnosis present

## 2024-01-26 DIAGNOSIS — E1022 Type 1 diabetes mellitus with diabetic chronic kidney disease: Secondary | ICD-10-CM | POA: Diagnosis present

## 2024-01-26 DIAGNOSIS — F419 Anxiety disorder, unspecified: Secondary | ICD-10-CM | POA: Diagnosis present

## 2024-01-26 DIAGNOSIS — Z87891 Personal history of nicotine dependence: Secondary | ICD-10-CM

## 2024-01-26 DIAGNOSIS — I10 Essential (primary) hypertension: Secondary | ICD-10-CM | POA: Diagnosis present

## 2024-01-26 DIAGNOSIS — Z9641 Presence of insulin pump (external) (internal): Secondary | ICD-10-CM | POA: Diagnosis present

## 2024-01-26 DIAGNOSIS — E785 Hyperlipidemia, unspecified: Secondary | ICD-10-CM | POA: Diagnosis present

## 2024-01-26 DIAGNOSIS — I639 Cerebral infarction, unspecified: Secondary | ICD-10-CM

## 2024-01-26 DIAGNOSIS — Z79899 Other long term (current) drug therapy: Secondary | ICD-10-CM

## 2024-01-26 DIAGNOSIS — Z91018 Allergy to other foods: Secondary | ICD-10-CM

## 2024-01-26 DIAGNOSIS — R112 Nausea with vomiting, unspecified: Secondary | ICD-10-CM | POA: Diagnosis present

## 2024-01-26 DIAGNOSIS — Z794 Long term (current) use of insulin: Secondary | ICD-10-CM

## 2024-01-26 DIAGNOSIS — I132 Hypertensive heart and chronic kidney disease with heart failure and with stage 5 chronic kidney disease, or end stage renal disease: Secondary | ICD-10-CM | POA: Diagnosis present

## 2024-01-26 DIAGNOSIS — Z886 Allergy status to analgesic agent status: Secondary | ICD-10-CM

## 2024-01-26 DIAGNOSIS — I480 Paroxysmal atrial fibrillation: Secondary | ICD-10-CM | POA: Diagnosis present

## 2024-01-26 HISTORY — DX: Dependence on renal dialysis: N18.6

## 2024-01-26 LAB — BASIC METABOLIC PANEL WITH GFR
Anion gap: 19 — ABNORMAL HIGH (ref 5–15)
BUN: 19 mg/dL (ref 6–20)
CO2: 26 mmol/L (ref 22–32)
Calcium: 10.1 mg/dL (ref 8.9–10.3)
Chloride: 92 mmol/L — ABNORMAL LOW (ref 98–111)
Creatinine, Ser: 5.59 mg/dL — ABNORMAL HIGH (ref 0.44–1.00)
GFR, Estimated: 10 mL/min — ABNORMAL LOW (ref >60)
Glucose, Bld: 173 mg/dL — ABNORMAL HIGH (ref 70–99)
Potassium: 4.1 mmol/L (ref 3.5–5.1)
Sodium: 137 mmol/L (ref 135–145)

## 2024-01-26 LAB — CBC WITH DIFFERENTIAL/PLATELET
Abs Immature Granulocytes: 0.01 10*3/uL (ref 0.00–0.07)
Basophils Absolute: 0.1 10*3/uL (ref 0.0–0.1)
Basophils Relative: 2 %
Eosinophils Absolute: 0.1 10*3/uL (ref 0.0–0.5)
Eosinophils Relative: 2 %
HCT: 47.5 % — ABNORMAL HIGH (ref 36.0–46.0)
Hemoglobin: 16.1 g/dL — ABNORMAL HIGH (ref 12.0–15.0)
Immature Granulocytes: 0 %
Lymphocytes Relative: 23 %
Lymphs Abs: 1.3 10*3/uL (ref 0.7–4.0)
MCH: 33.3 pg (ref 26.0–34.0)
MCHC: 33.9 g/dL (ref 30.0–36.0)
MCV: 98.3 fL (ref 80.0–100.0)
Monocytes Absolute: 0.4 10*3/uL (ref 0.1–1.0)
Monocytes Relative: 6 %
Neutro Abs: 3.7 10*3/uL (ref 1.7–7.7)
Neutrophils Relative %: 67 %
Platelets: 201 10*3/uL (ref 150–400)
RBC: 4.83 MIL/uL (ref 3.87–5.11)
RDW: 13.4 % (ref 11.5–15.5)
WBC: 5.6 10*3/uL (ref 4.0–10.5)
nRBC: 0 % (ref 0.0–0.2)

## 2024-01-26 LAB — I-STAT CHEM 8, ED
BUN: 21 mg/dL — ABNORMAL HIGH (ref 6–20)
Calcium, Ion: 1.02 mmol/L — ABNORMAL LOW (ref 1.15–1.40)
Chloride: 96 mmol/L — ABNORMAL LOW (ref 98–111)
Creatinine, Ser: 5.6 mg/dL — ABNORMAL HIGH (ref 0.44–1.00)
Glucose, Bld: 182 mg/dL — ABNORMAL HIGH (ref 70–99)
HCT: 51 % — ABNORMAL HIGH (ref 36.0–46.0)
Hemoglobin: 17.3 g/dL — ABNORMAL HIGH (ref 12.0–15.0)
Potassium: 4 mmol/L (ref 3.5–5.1)
Sodium: 136 mmol/L (ref 135–145)
TCO2: 27 mmol/L (ref 22–32)

## 2024-01-26 LAB — CBG MONITORING, ED: Glucose-Capillary: 171 mg/dL — ABNORMAL HIGH (ref 70–99)

## 2024-01-26 MED ORDER — LORAZEPAM 2 MG/ML IJ SOLN
INTRAMUSCULAR | Status: AC
  Administered 2024-01-26: 2 mg via INTRAMUSCULAR
  Filled 2024-01-26: qty 1

## 2024-01-26 MED ORDER — LORAZEPAM 2 MG/ML IJ SOLN: 2.0000 mg | Freq: Once | INTRAMUSCULAR | Status: AC

## 2024-01-26 NOTE — ED Notes (Signed)
 Enter room patient having sz like activity lasting apoox 2-3 mins. Provider called to bedside 2 mg ativan  IM ordered and admin as ordered. Patient remains on cardiac monitoring spo2 100% on 2L [place]  patient lethargic at this time. Not responding to command 18L wrist placed. Sz pads placed on stretcher safety maintained will monitor.

## 2024-01-26 NOTE — ED Notes (Signed)
 Patient to CT.

## 2024-01-26 NOTE — ED Triage Notes (Signed)
 BIBA from home patient is dialysis  patient seen today. EMS reports patient with abd pain, N/V started when she got home. Patient reports 2L removed today. Patient reports does not make urine. Patient has h/o DM with insulin  pump that delivers 0.5units q1h. Patient refusing to remove insulin  pump at this time CBG on arriva l to ER is 171

## 2024-01-26 NOTE — ED Provider Notes (Signed)
 [facility] Provider Note   CSN: [id number] Arrival date & time: 01/26/24  2243     History {Add pertinent medical, surgical, social history, OB history to HPI:1} Chief Complaint  Patient presents with   Emesis    N/V    Level 5 caveat due to acuity of condition Kathy Lewis is a 28 y.o. female.  The history is limited by the condition of the patient.  Patient with end-stage renal disease on dialysis, postpartum cardiomyopathy, diabetes presents for nausea and vomiting Patient originally came to the ED and told nursing staff that she had abdominal pain with nausea vomiting after dialysis.  She reports she had her 2 L removed  After arrival to the room, patient started having seizure activity and I was called to evaluate    Past Medical History:  Diagnosis Date   Anemia    Anxiety    Bipolar 2 disorder (HCC)    Chronic kidney disease    Chronic systolic (congestive) heart failure (HCC)    Depression    DKA (diabetic ketoacidoses)    ESRD on peritoneal dialysis (HCC)    HSV infection    on valtrex    Hypokalemia    Leukocytosis    Migraine    Noncompliance with medication regimen    Preeclampsia    Prolonged QT syndrome    Severe anemia    Type 1 diabetes mellitus (HCC)     Home Medications Prior to Admission medications   Medication Sig Start Date End Date Taking? Authorizing Provider  acetaminophen  (TYLENOL ) 500 MG tablet Take 1,000 mg by mouth daily as needed for headache (pain).    [provider]  amitriptyline  (ELAVIL ) 50 MG tablet Take 1 tablet (50 mg total) by mouth at bedtime. 08/30/22   Arlyne Bering, NP  atorvastatin  (LIPITOR ) 40 MG tablet Take 1 tablet (40 mg total) by mouth every evening. 05/07/23   Bensimhon, Rheta Celestine, MD  B Complex-C-Folic Acid  (RENA-VITE RX) 1 MG TABS Take 1 tablet by mouth daily. 01/01/23   [provider]  calcium  acetate (PHOSLO ) 667 MG capsule Take 1,334 mg by mouth  3 (three) times daily with meals. 09/28/22   [provider]  carvedilol  (COREG ) 3.125 MG tablet Take 1 tablet (3.125 mg total) by mouth 2 (two) times daily with a meal. 08/07/23   Amin, Ankit C, MD  clonazePAM  (KLONOPIN ) 0.5 MG tablet Take 1 tablet (0.5 mg total) by mouth 2 (two) times daily as needed for anxiety. Patient taking differently: Take 0.5 mg by mouth at bedtime. 03/16/22   Singh, Prashant K, MD  ELIQUIS  2.5 MG TABS tablet Take 2.5 mg by mouth 2 (two) times daily.    [provider]  hydrOXYzine  (ATARAX ) 10 MG tablet Take 10 mg by mouth daily.    [provider]  insulin  lispro (HUMALOG ) 100 UNIT/ML injection Inject 0.5 Units into the skin See admin instructions. 0.5 units per hour via insulin  pump. 01/08/22   [provider]  isosorbide  mononitrate (IMDUR ) 30 MG 24 hr tablet Take 0.5 tablets (15 mg total) by mouth daily. 08/08/23   Amin, Ankit C, MD  magnesium  oxide (MAG-OX) 400 MG tablet Take 2 tablets (800 mg total) by mouth daily. 05/07/23   Bensimhon, Rheta Celestine, MD  methocarbamol  (ROBAXIN ) 500 MG tablet Take 500 mg by mouth as needed for muscle spasms.    [provider]  metoCLOPramide  (REGLAN ) 10 MG tablet Take 1 tablet (10 mg total)  by mouth every 6 (six) hours. 11/18/23   Prosperi, Christian H, PA-C  omeprazole (PRILOSEC) 20 MG capsule Take 20 mg by mouth daily. 04/11/23   [provider]  SUMAtriptan  (IMITREX ) 50 MG tablet Take 50 mg by mouth daily as needed for headache.    [provider]  UBRELVY  100 MG TABS Take 100 mg by mouth daily as needed (migraine). 07/09/23   [provider]  valACYclovir  (VALTREX ) 500 MG tablet Take 500 mg by mouth daily as needed (cold sores). 04/17/22   [provider]  VELPHORO  500 MG chewable tablet Chew 1,000 mg by mouth 3 (three) times daily with meals. 06/21/23   [provider]  Vitamin D , Ergocalciferol , (DRISDOL ) 1.25 MG (50000 UNIT) CAPS capsule Take 50,000  Units by mouth every Sunday.    [provider]  escitalopram  (LEXAPRO ) 10 MG tablet Take 20 mg by mouth daily.  05/10/20 10/06/20  [provider]  furosemide  (LASIX ) 40 MG tablet Take 1 tablet (40 mg total) by mouth daily. Patient not taking: Reported on 02/16/2021 10/28/19 02/17/21  Varnado, Evelyn, MD  lisinopril  (ZESTRIL ) 10 MG tablet Take 10 mg by mouth daily.  02/23/20 10/06/20  [provider]  spironolactone  (ALDACTONE ) 25 MG tablet Take 25 mg by mouth daily.  04/20/20 10/06/20  [provider]      Allergies    Cantaloupe extract allergy skin test, Citrullus vulgaris, Food, Strawberry extract, and Nsaids    Review of Systems   Review of Systems  Unable to perform ROS: Acuity of condition    Physical Exam Updated Vital Signs There were no vitals taken for this visit. Physical Exam CONSTITUTIONAL: Ill-appearing HEAD: Normocephalic/atraumatic EYES: Pinpoint bilaterally, no nystagmus ENMT: Mucous membranes moist NECK: supple no meningeal signs CV: S1/S2 noted, tachycardic LUNGS: Lungs are clear to auscultation bilaterally, no apparent distress ABDOMEN: soft, nontender NEURO: Pt is unresponsive and actively seizing EXTREMITIES: pulses normal/equal, no deformities SKIN: warm, color normal, dialysis catheter noted to right upper chest  ED Results / Procedures / Treatments   Labs (all labs ordered are listed, but only abnormal results are displayed) Labs Reviewed  CBG MONITORING, ED - Abnormal; Notable for the following components:      Result Value   Glucose-Capillary 171 (*)    All other components within normal limits  CBC WITH DIFFERENTIAL/PLATELET  HCG, SERUM, QUALITATIVE  BASIC METABOLIC PANEL WITH GFR  I-STAT CHEM 8, ED    EKG None  Radiology No results found.  Procedures .Critical Care  Performed by: Eldon Greenland, MD Authorized by: Eldon Greenland, MD   Critical care provider statement:    Critical care start time:   01/26/2024 11:10 PM   Critical care end time:  01/26/2024 11:10 PM   Critical care time was exclusive of:  Separately billable procedures and treating other patients   Critical care was necessary to treat or prevent imminent or life-threatening deterioration of the following conditions:  CNS failure or compromise, dehydration, metabolic crisis and renal failure   Critical care was time spent personally by me on the following activities:  Examination of patient, evaluation of patient's response to treatment, development of treatment plan with patient or surrogate, pulse oximetry, ordering and review of laboratory studies, ordering and review of radiographic studies, re-evaluation of patient's condition and review of old charts   I assumed direction of critical care for this patient from another provider in my specialty: no     {Document cardiac monitor, telemetry assessment procedure when appropriate:1}  Medications Ordered in ED Medications  LORazepam  (ATIVAN ) injection 2 mg (2 mg Intramuscular Given 01/26/24 2304)    ED Course/ Medical Decision Making/ A&P   {   Click here for ABCD2, HEART and other calculatorsREFRESH Note before signing :1}                              Medical Decision Making Amount and/or Complexity of Data Reviewed Labs: ordered. Radiology: ordered. ECG/medicine tests: ordered.  Risk Prescription drug management.     This patient presents to the ED for concern of vomiting and abdominal pain, this involves an extensive number of treatment options, and is a complaint that carries with it a high risk of complications and morbidity.  The differential diagnosis includes but is not limited to  pancreatitis, gastritis, peptic ulcer disease, appendicitis, bowel obstruction, bowel perforation,  ischemic bowel Ectopic pregnancy, tubo-ovarian abscess, PID, acute coronary syndrome, electrolyte imbalance, intracranial hemorrhage  Comorbidities that complicate the patient  evaluation: Patient's presentation is complicated by their history of end-stage renal disease  Social Determinants of Health: Patient's multiple ER visits  increases the complexity of managing their presentation  Additional history obtained: Records reviewed Care Everywhere/External Records  Lab Tests: I Ordered, and personally interpreted labs.  The pertinent results include:  ***  Imaging Studies ordered: I ordered imaging studies including CT scan head  I independently visualized and interpreted imaging which showed *** I agree with the radiologist interpretation  Cardiac Monitoring: The patient was maintained on a cardiac monitor.  I personally viewed and interpreted the cardiac monitor which showed an underlying rhythm of:  {cardiac monitor:26849}  Medicines ordered and prescription drug management: I ordered medication including Ativan  for seizures Reevaluation of the patient after these medicines showed that the patient    {resolved/improved/worsened:23923::"improved"}  Test Considered: Patient is low risk / negative by ***, therefore do not feel that *** is indicated.  Critical Interventions:  ***  Consultations Obtained: I requested consultation with the {consultation:26851}, and discussed  findings as well as pertinent plan - they recommend: ***  Reevaluation: After the interventions noted above, I reevaluated the patient and found that they have :{resolved/improved/worsened:23923::"improved"}  Complexity of problems addressed: Patient's presentation is most consistent with  acute presentation with potential threat to life or bodily function  Disposition: After consideration of the diagnostic results and the patient's response to treatment,  I feel that the patent would benefit from admission  .    {Document review of labs and clinical decision tools ie heart score, Chads2Vasc2 etc:1}  {Document your independent review of radiology images, and any outside  records:1} {Document your discussion with family members, caretakers, and with consultants:1} {Document social determinants of health affecting pt's care:1} {Document your decision making why or why not admission, treatments were needed:1} Final Clinical Impression(s) / ED Diagnoses Final diagnoses:  None    Rx / DC Orders ED Discharge Orders     None

## 2024-01-27 ENCOUNTER — Emergency Department (HOSPITAL_COMMUNITY)

## 2024-01-27 ENCOUNTER — Other Ambulatory Visit (HOSPITAL_COMMUNITY)

## 2024-01-27 ENCOUNTER — Inpatient Hospital Stay (HOSPITAL_COMMUNITY)

## 2024-01-27 ENCOUNTER — Other Ambulatory Visit: Payer: Self-pay

## 2024-01-27 DIAGNOSIS — N186 End stage renal disease: Secondary | ICD-10-CM | POA: Diagnosis present

## 2024-01-27 DIAGNOSIS — I160 Hypertensive urgency: Secondary | ICD-10-CM | POA: Diagnosis present

## 2024-01-27 DIAGNOSIS — E785 Hyperlipidemia, unspecified: Secondary | ICD-10-CM | POA: Diagnosis present

## 2024-01-27 DIAGNOSIS — I5022 Chronic systolic (congestive) heart failure: Secondary | ICD-10-CM | POA: Diagnosis present

## 2024-01-27 DIAGNOSIS — R569 Unspecified convulsions: Secondary | ICD-10-CM | POA: Diagnosis present

## 2024-01-27 DIAGNOSIS — B009 Herpesviral infection, unspecified: Secondary | ICD-10-CM | POA: Diagnosis present

## 2024-01-27 DIAGNOSIS — G43909 Migraine, unspecified, not intractable, without status migrainosus: Secondary | ICD-10-CM | POA: Diagnosis present

## 2024-01-27 DIAGNOSIS — E878 Other disorders of electrolyte and fluid balance, not elsewhere classified: Secondary | ICD-10-CM | POA: Diagnosis not present

## 2024-01-27 DIAGNOSIS — I953 Hypotension of hemodialysis: Secondary | ICD-10-CM | POA: Diagnosis not present

## 2024-01-27 DIAGNOSIS — D631 Anemia in chronic kidney disease: Secondary | ICD-10-CM | POA: Diagnosis present

## 2024-01-27 DIAGNOSIS — K3184 Gastroparesis: Secondary | ICD-10-CM | POA: Diagnosis present

## 2024-01-27 DIAGNOSIS — F419 Anxiety disorder, unspecified: Secondary | ICD-10-CM | POA: Diagnosis present

## 2024-01-27 DIAGNOSIS — K219 Gastro-esophageal reflux disease without esophagitis: Secondary | ICD-10-CM | POA: Diagnosis present

## 2024-01-27 DIAGNOSIS — Z8673 Personal history of transient ischemic attack (TIA), and cerebral infarction without residual deficits: Secondary | ICD-10-CM

## 2024-01-27 DIAGNOSIS — Z9641 Presence of insulin pump (external) (internal): Secondary | ICD-10-CM | POA: Diagnosis present

## 2024-01-27 DIAGNOSIS — E10649 Type 1 diabetes mellitus with hypoglycemia without coma: Secondary | ICD-10-CM | POA: Diagnosis not present

## 2024-01-27 DIAGNOSIS — E875 Hyperkalemia: Secondary | ICD-10-CM | POA: Diagnosis not present

## 2024-01-27 DIAGNOSIS — E1022 Type 1 diabetes mellitus with diabetic chronic kidney disease: Secondary | ICD-10-CM | POA: Diagnosis present

## 2024-01-27 DIAGNOSIS — I639 Cerebral infarction, unspecified: Secondary | ICD-10-CM | POA: Diagnosis not present

## 2024-01-27 DIAGNOSIS — E1043 Type 1 diabetes mellitus with diabetic autonomic (poly)neuropathy: Secondary | ICD-10-CM | POA: Diagnosis present

## 2024-01-27 DIAGNOSIS — F3181 Bipolar II disorder: Secondary | ICD-10-CM | POA: Diagnosis present

## 2024-01-27 DIAGNOSIS — Z992 Dependence on renal dialysis: Secondary | ICD-10-CM | POA: Diagnosis not present

## 2024-01-27 DIAGNOSIS — Z794 Long term (current) use of insulin: Secondary | ICD-10-CM | POA: Diagnosis not present

## 2024-01-27 DIAGNOSIS — I132 Hypertensive heart and chronic kidney disease with heart failure and with stage 5 chronic kidney disease, or end stage renal disease: Secondary | ICD-10-CM | POA: Diagnosis present

## 2024-01-27 DIAGNOSIS — E1065 Type 1 diabetes mellitus with hyperglycemia: Secondary | ICD-10-CM | POA: Diagnosis present

## 2024-01-27 DIAGNOSIS — I480 Paroxysmal atrial fibrillation: Secondary | ICD-10-CM | POA: Diagnosis present

## 2024-01-27 DIAGNOSIS — N2581 Secondary hyperparathyroidism of renal origin: Secondary | ICD-10-CM | POA: Diagnosis present

## 2024-01-27 LAB — RENAL FUNCTION PANEL
Albumin: 4.1 g/dL (ref 3.5–5.0)
Anion gap: 20 — ABNORMAL HIGH (ref 5–15)
BUN: 28 mg/dL — ABNORMAL HIGH (ref 6–20)
CO2: 25 mmol/L (ref 22–32)
Calcium: 10.1 mg/dL (ref 8.9–10.3)
Chloride: 90 mmol/L — ABNORMAL LOW (ref 98–111)
Creatinine, Ser: 6.46 mg/dL — ABNORMAL HIGH (ref 0.44–1.00)
GFR, Estimated: 8 mL/min — ABNORMAL LOW (ref >60)
Glucose, Bld: 252 mg/dL — ABNORMAL HIGH (ref 70–99)
Phosphorus: 8.8 mg/dL — ABNORMAL HIGH (ref 2.5–4.6)
Potassium: 5.1 mmol/L (ref 3.5–5.1)
Sodium: 135 mmol/L (ref 135–145)

## 2024-01-27 LAB — CBC
HCT: 44.5 % (ref 36.0–46.0)
Hemoglobin: 15 g/dL (ref 12.0–15.0)
MCH: 33.2 pg (ref 26.0–34.0)
MCHC: 33.7 g/dL (ref 30.0–36.0)
MCV: 98.5 fL (ref 80.0–100.0)
Platelets: 177 10*3/uL (ref 150–400)
RBC: 4.52 MIL/uL (ref 3.87–5.11)
RDW: 13.6 % (ref 11.5–15.5)
WBC: 6.8 10*3/uL (ref 4.0–10.5)
nRBC: 0 % (ref 0.0–0.2)

## 2024-01-27 LAB — BASIC METABOLIC PANEL WITH GFR
Anion gap: 22 — ABNORMAL HIGH (ref 5–15)
BUN: 40 mg/dL — ABNORMAL HIGH (ref 6–20)
CO2: 22 mmol/L (ref 22–32)
Calcium: 10 mg/dL (ref 8.9–10.3)
Chloride: 91 mmol/L — ABNORMAL LOW (ref 98–111)
Creatinine, Ser: 7.77 mg/dL — ABNORMAL HIGH (ref 0.44–1.00)
GFR, Estimated: 7 mL/min — ABNORMAL LOW (ref >60)
Glucose, Bld: 269 mg/dL — ABNORMAL HIGH (ref 70–99)
Potassium: 4.7 mmol/L (ref 3.5–5.1)
Sodium: 135 mmol/L (ref 135–145)

## 2024-01-27 LAB — BLOOD GAS, ARTERIAL
Acid-Base Excess: 8.6 mmol/L — ABNORMAL HIGH (ref 0.0–2.0)
Bicarbonate: 33.5 mmol/L — ABNORMAL HIGH (ref 20.0–28.0)
Drawn by: 252031
O2 Saturation: 100 %
Patient temperature: 36.7
pCO2 arterial: 45 mmHg (ref 32–48)
pH, Arterial: 7.48 — ABNORMAL HIGH (ref 7.35–7.45)
pO2, Arterial: 160 mmHg — ABNORMAL HIGH (ref 83–108)

## 2024-01-27 LAB — GLUCOSE, CAPILLARY
Glucose-Capillary: 381 mg/dL — ABNORMAL HIGH (ref 70–99)
Glucose-Capillary: 403 mg/dL — ABNORMAL HIGH (ref 70–99)
Glucose-Capillary: 221 mg/dL — ABNORMAL HIGH (ref 70–99)
Glucose-Capillary: 221 mg/dL — ABNORMAL HIGH (ref 70–99)
Glucose-Capillary: 197 mg/dL — ABNORMAL HIGH (ref 70–99)
Glucose-Capillary: 204 mg/dL — ABNORMAL HIGH (ref 70–99)

## 2024-01-27 LAB — HEMOGLOBIN A1C
Hgb A1c MFr Bld: 8.1 % — ABNORMAL HIGH (ref 4.8–5.6)
Mean Plasma Glucose: 185.77 mg/dL

## 2024-01-27 LAB — MAGNESIUM: Magnesium: 2.3 mg/dL (ref 1.7–2.4)

## 2024-01-27 LAB — HIV ANTIBODY (ROUTINE TESTING W REFLEX): HIV Screen 4th Generation wRfx: NONREACTIVE

## 2024-01-27 LAB — BETA-HYDROXYBUTYRIC ACID: Beta-Hydroxybutyric Acid: 0.34 mmol/L — ABNORMAL HIGH (ref 0.05–0.27)

## 2024-01-27 LAB — CBG MONITORING, ED
Glucose-Capillary: 212 mg/dL — ABNORMAL HIGH (ref 70–99)
Glucose-Capillary: 314 mg/dL — ABNORMAL HIGH (ref 70–99)

## 2024-01-27 LAB — HCG, SERUM, QUALITATIVE: Preg, Serum: NEGATIVE

## 2024-01-27 LAB — RPR: RPR Ser Ql: NONREACTIVE

## 2024-01-27 MED ORDER — LABETALOL HCL 5 MG/ML IV SOLN
10.0000 mg | Freq: Once | INTRAVENOUS | Status: AC
  Administered 2024-01-27: 10 mg via INTRAVENOUS
  Filled 2024-01-27: qty 4

## 2024-01-27 MED ORDER — HEPARIN SODIUM (PORCINE) 5000 UNIT/ML IJ SOLN: 5000.0000 [IU] | Freq: Three times a day (TID) | INTRAMUSCULAR | Status: DC

## 2024-01-27 MED ORDER — PANTOPRAZOLE SODIUM 40 MG IV SOLR
40.0000 mg | INTRAVENOUS | Status: DC
  Administered 2024-01-27 – 2024-01-31 (×5): 40 mg via INTRAVENOUS
  Filled 2024-01-27 (×5): qty 10

## 2024-01-27 MED ORDER — PROCHLORPERAZINE EDISYLATE 10 MG/2ML IJ SOLN: 5.0000 mg | Freq: Four times a day (QID) | INTRAMUSCULAR | Status: DC | PRN

## 2024-01-27 MED ORDER — APIXABAN 2.5 MG PO TABS
2.5000 mg | ORAL_TABLET | Freq: Two times a day (BID) | ORAL | Status: DC
  Administered 2024-01-27 – 2024-01-31 (×7): 2.5 mg via ORAL
  Filled 2024-01-27 (×7): qty 1

## 2024-01-27 MED ORDER — ACETAMINOPHEN 325 MG PO TABS
650.0000 mg | ORAL_TABLET | Freq: Four times a day (QID) | ORAL | Status: DC | PRN
  Administered 2024-01-27: 650 mg via ORAL
  Filled 2024-01-27 (×2): qty 2

## 2024-01-27 MED ORDER — INSULIN ASPART 100 UNIT/ML IJ SOLN
0.0000 [IU] | INTRAMUSCULAR | Status: DC
  Administered 2024-01-27: 4 [IU] via SUBCUTANEOUS
  Administered 2024-01-27: 2 [IU] via SUBCUTANEOUS

## 2024-01-27 MED ORDER — PROCHLORPERAZINE EDISYLATE 10 MG/2ML IJ SOLN
10.0000 mg | Freq: Once | INTRAMUSCULAR | Status: AC
  Administered 2024-01-27: 10 mg via INTRAVENOUS
  Filled 2024-01-27: qty 2

## 2024-01-27 MED ORDER — LEVETIRACETAM (KEPPRA) 500 MG/5 ML ADULT IV PUSH: 500.0000 mg | Freq: Two times a day (BID) | INTRAVENOUS | Status: DC

## 2024-01-27 MED ORDER — LABETALOL HCL 5 MG/ML IV SOLN
10.0000 mg | INTRAVENOUS | Status: DC | PRN
  Administered 2024-01-27 – 2024-01-30 (×5): 10 mg via INTRAVENOUS
  Filled 2024-01-27 (×5): qty 4

## 2024-01-27 MED ORDER — INSULIN GLARGINE-YFGN 100 UNIT/ML ~~LOC~~ SOLN
5.0000 [IU] | Freq: Every day | SUBCUTANEOUS | Status: DC
  Administered 2024-01-28: 5 [IU] via SUBCUTANEOUS
  Filled 2024-01-27 (×3): qty 0.05

## 2024-01-27 MED ORDER — LEVETIRACETAM (KEPPRA) 500 MG/5 ML ADULT IV PUSH
250.0000 mg | INTRAVENOUS | Status: DC
  Administered 2024-01-29: 250 mg via INTRAVENOUS
  Filled 2024-01-27: qty 5

## 2024-01-27 MED ORDER — LEVETIRACETAM (KEPPRA) 500 MG/5 ML ADULT IV PUSH
500.0000 mg | INTRAVENOUS | Status: DC
  Administered 2024-01-27 – 2024-01-31 (×5): 500 mg via INTRAVENOUS
  Filled 2024-01-27 (×5): qty 5

## 2024-01-27 MED ORDER — POLYETHYLENE GLYCOL 3350 17 G PO PACK: 17.0000 g | PACK | Freq: Every day | ORAL | Status: DC | PRN

## 2024-01-27 MED ORDER — PROCHLORPERAZINE EDISYLATE 10 MG/2ML IJ SOLN
10.0000 mg | Freq: Four times a day (QID) | INTRAMUSCULAR | Status: DC | PRN
  Administered 2024-01-27 – 2024-01-30 (×5): 10 mg via INTRAVENOUS
  Filled 2024-01-27 (×5): qty 2

## 2024-01-27 MED ORDER — METOCLOPRAMIDE HCL 5 MG/ML IJ SOLN
10.0000 mg | Freq: Four times a day (QID) | INTRAMUSCULAR | Status: DC
  Administered 2024-01-27 – 2024-01-31 (×16): 10 mg via INTRAVENOUS
  Filled 2024-01-27 (×17): qty 2

## 2024-01-27 MED ORDER — METOPROLOL TARTRATE 5 MG/5ML IV SOLN
2.5000 mg | Freq: Four times a day (QID) | INTRAVENOUS | Status: DC | PRN
  Administered 2024-01-27: 2.5 mg via INTRAVENOUS
  Filled 2024-01-27: qty 5

## 2024-01-27 MED ORDER — HYDROMORPHONE HCL 1 MG/ML IJ SOLN
0.5000 mg | INTRAMUSCULAR | Status: DC | PRN
  Filled 2024-01-27: qty 0.5

## 2024-01-27 MED ORDER — AMITRIPTYLINE HCL 25 MG PO TABS
25.0000 mg | ORAL_TABLET | Freq: Every day | ORAL | Status: DC
  Administered 2024-01-27 – 2024-01-30 (×3): 25 mg via ORAL
  Filled 2024-01-27 (×5): qty 1

## 2024-01-27 MED ORDER — INSULIN ASPART 100 UNIT/ML IJ SOLN
0.0000 [IU] | INTRAMUSCULAR | Status: DC
  Administered 2024-01-27: 5 [IU] via SUBCUTANEOUS
  Administered 2024-01-27: 11 [IU] via SUBCUTANEOUS
  Administered 2024-01-27: 3 [IU] via SUBCUTANEOUS
  Administered 2024-01-28: 5 [IU] via SUBCUTANEOUS
  Administered 2024-01-28: 20 [IU] via SUBCUTANEOUS
  Administered 2024-01-28: 10 [IU] via SUBCUTANEOUS
  Administered 2024-01-29: 8 [IU] via SUBCUTANEOUS
  Administered 2024-01-29: 5 [IU] via SUBCUTANEOUS

## 2024-01-27 MED ORDER — LORAZEPAM 2 MG/ML IJ SOLN
2.0000 mg | Freq: Four times a day (QID) | INTRAMUSCULAR | Status: DC | PRN
  Administered 2024-01-28: 2 mg via INTRAVENOUS
  Filled 2024-01-27 (×2): qty 1

## 2024-01-27 MED ORDER — LORAZEPAM 2 MG/ML IJ SOLN
2.0000 mg | Freq: Once | INTRAMUSCULAR | Status: AC
  Administered 2024-01-27: 2 mg via INTRAVENOUS
  Filled 2024-01-27: qty 1

## 2024-01-27 MED ORDER — AMITRIPTYLINE HCL 25 MG PO TABS: 50.0000 mg | ORAL_TABLET | Freq: Every day | ORAL | Status: DC

## 2024-01-27 MED ORDER — CHLORHEXIDINE GLUCONATE CLOTH 2 % EX PADS
6.0000 | MEDICATED_PAD | Freq: Every day | CUTANEOUS | Status: DC
  Administered 2024-01-28 – 2024-01-31 (×3): 6 via TOPICAL

## 2024-01-27 MED ORDER — MELATONIN 5 MG PO TABS: 5.0000 mg | ORAL_TABLET | Freq: Every evening | ORAL | Status: DC | PRN

## 2024-01-27 MED ORDER — LEVETIRACETAM (KEPPRA) 500 MG/5 ML ADULT IV PUSH
500.0000 mg | Freq: Once | INTRAVENOUS | Status: AC
  Administered 2024-01-27: 500 mg via INTRAVENOUS
  Filled 2024-01-27: qty 5

## 2024-01-27 MED ORDER — NITROGLYCERIN 2 % TD OINT
0.5000 [in_us] | TOPICAL_OINTMENT | Freq: Four times a day (QID) | TRANSDERMAL | Status: DC
  Administered 2024-01-27 – 2024-01-31 (×14): 0.5 [in_us] via TOPICAL
  Filled 2024-01-27: qty 30

## 2024-01-27 NOTE — Progress Notes (Signed)
 EEG complete - results pending

## 2024-01-27 NOTE — ED Notes (Signed)
 Return from CT

## 2024-01-27 NOTE — Progress Notes (Signed)
  X-cover Note:  Asked by attending to f/u on ABG.  Arterial Blood Gas: Lab Results  Component Value Date/Time   PHART 7.48 (H) 01/27/2024 08:32 PM   PCO2ART 45 01/27/2024 08:32 PM   PO2ART 160 (H) 01/27/2024 08:32 PM   HCO3 33.5 (H) 01/27/2024 08:32 PM   O2SAT 100 01/27/2024 08:32 PM   FIO2 0.21 06/17/2019 10:40 AM      Unk Garb, DO [facility]

## 2024-01-27 NOTE — H&P (Addendum)
 History and Physical  Kathy Lewis ZOX:[id number] DOB: 1996/05/16 DOA: 01/26/2024  Referring physician: Dr. Wallis Gun, EDP. PCP: Belva Boyden, PA  Outpatient Specialists: Nephrology. Patient coming from: Home.  Chief Complaint: Nausea vomiting abdominal pain, seizure like activity.  HPI: Kathy Lewis is a 27 y.o. female with medical history significant for ESRD on HD TTS, type 1 diabetes, gastroparesis, postpartum cardiomyopathy, prior CVA, chronic anxiety, who initially presented to the ER with complaints of nausea, vomiting, and abdominal pain.  While in the ER, she had a witnessed generalized seizure by staff that lasted a few minutes.  She was given IV Ativan  with improvement.  However, she had another witnessed seizure like activity in the ER with generalized tonic-clonic movements.  No prior history of seizures.  Started on IV Keppra.  Nephrology consulted to assist with the management.  Noncontrast head CT showed no acute intracranial abnormality.  Admitted by [facility], hospitalist service.  At the time of this visit: The patient is in the room accompanied by her father.  She appears postictal.  She arouses to tactile stimuli and follows commands then she drifts back to sleep.  She is able to protect her airway.    No history of alcohol use or illicit drugs, per her father at bedside.  The patient was in her usual state of health prior to going to hemodialysis around 7 AM this morning.  After hemodialysis, when she returned home, she felt nauseous, vomited and complained of abdominal pain.  Thus she was brought into the ER.  ED Course: Temperature 98.2.  BP 138/117, pulse 105, respiratory rate 12, O2 saturation 100% on room air.  Lab studies notable for WBC 5.6, hemoglobin 16.1, platelet count 201.  Serum sodium 136, potassium 4.0, serum bicarb 26, glucose 182, BUN 21, creatinine 5.60, GFR 10.  Review of Systems: Review of systems as noted in the HPI. All other systems reviewed and  are negative.   Past Medical History:  Diagnosis Date   Anemia    Anxiety    Bipolar 2 disorder (HCC)    Chronic kidney disease    Chronic systolic (congestive) heart failure (HCC)    Depression    DKA (diabetic ketoacidoses)    ESRD on peritoneal dialysis (HCC)    HSV infection    on valtrex    Hypokalemia    Leukocytosis    Migraine    Noncompliance with medication regimen    Preeclampsia    Prolonged QT syndrome    Severe anemia    Type 1 diabetes mellitus (HCC)    Past Surgical History:  Procedure Laterality Date   BIOPSY  04/24/2022   Procedure: BIOPSY;  Surgeon: Elois Hair, MD;  Location: [facility] ENDOSCOPY;  Service: Gastroenterology;;   CARDIAC CATHETERIZATION     COLONOSCOPY WITH PROPOFOL  N/A 04/24/2022   Procedure: COLONOSCOPY WITH PROPOFOL ;  Surgeon: Elois Hair, MD;  Location: [facility] ENDOSCOPY;  Service: Gastroenterology;  Laterality: N/A;   DILATION AND EVACUATION N/A 10/22/2019   Procedure: ULTRASOUND GUIDED DILATATION AND EVACUATION;  Surgeon: Johnn Najjar, MD;  Location: [facility] LD ORS;  Service: Gynecology;  Laterality: N/A;   ESOPHAGOGASTRODUODENOSCOPY (EGD) WITH PROPOFOL  N/A 04/24/2022   Procedure: ESOPHAGOGASTRODUODENOSCOPY (EGD) WITH PROPOFOL ;  Surgeon: Elois Hair, MD;  Location: [facility] ENDOSCOPY;  Service: Gastroenterology;  Laterality: N/A;   IR FLUORO GUIDE CV LINE RIGHT  09/14/2022   IR US  GUIDE VASC ACCESS RIGHT  09/14/2022   peritoneal dialysis catheter insertion     RENAL BIOPSY  Social History:  reports that she has quit smoking. Her smoking use included cigars and cigarettes. She has a 2 pack-year smoking history. She has never used smokeless tobacco. She reports that she does not drink alcohol and does not use drugs.   Allergies  Allergen Reactions   Cantaloupe Extract Allergy Skin Test Itching and Other (See Comments)    Mouth itching     Citrullus Vulgaris Itching and Other (See Comments)    Makes mouth itch, ALL  melons     Food Itching and Other (See Comments)    All melon - mouth itching   Strawberry Extract Itching and Other (See Comments)    Mouth itching   Nsaids Itching and Other (See Comments)    Avoid per nephrology    Family History  Adopted: Yes  Problem Relation Age of Onset   Heart disease Neg Hx       Prior to Admission medications   Medication Sig Start Date End Date Taking? Authorizing Provider  acetaminophen  (TYLENOL ) 500 MG tablet Take 1,000 mg by mouth daily as needed for headache (pain).    [provider]  amitriptyline  (ELAVIL ) 50 MG tablet Take 1 tablet (50 mg total) by mouth at bedtime. 08/30/22   Arlyne Bering, NP  atorvastatin  (LIPITOR ) 40 MG tablet Take 1 tablet (40 mg total) by mouth every evening. 05/07/23   Bensimhon, Rheta Celestine, MD  B Complex-C-Folic Acid  (RENA-VITE RX) 1 MG TABS Take 1 tablet by mouth daily. 01/01/23   [provider]  calcium  acetate (PHOSLO ) 667 MG capsule Take 1,334 mg by mouth 3 (three) times daily with meals. 09/28/22   [provider]  carvedilol  (COREG ) 3.125 MG tablet Take 1 tablet (3.125 mg total) by mouth 2 (two) times daily with a meal. 08/07/23   Amin, Ankit C, MD  clonazePAM  (KLONOPIN ) 0.5 MG tablet Take 1 tablet (0.5 mg total) by mouth 2 (two) times daily as needed for anxiety. Patient taking differently: Take 0.5 mg by mouth at bedtime. 03/16/22   Singh, Prashant K, MD  ELIQUIS  2.5 MG TABS tablet Take 2.5 mg by mouth 2 (two) times daily.    [provider]  hydrOXYzine  (ATARAX ) 10 MG tablet Take 10 mg by mouth daily.    [provider]  insulin  lispro (HUMALOG ) 100 UNIT/ML injection Inject 0.5 Units into the skin See admin instructions. 0.5 units per hour via insulin  pump. 01/08/22   [provider]  isosorbide  mononitrate (IMDUR ) 30 MG 24 hr tablet Take 0.5 tablets (15 mg total) by mouth daily. 08/08/23   Amin, Ankit C, MD  magnesium  oxide (MAG-OX) 400 MG tablet Take 2 tablets (800 mg  total) by mouth daily. 05/07/23   Bensimhon, Rheta Celestine, MD  methocarbamol  (ROBAXIN ) 500 MG tablet Take 500 mg by mouth as needed for muscle spasms.    [provider]  metoCLOPramide  (REGLAN ) 10 MG tablet Take 1 tablet (10 mg total) by mouth every 6 (six) hours. 11/18/23   Prosperi, Christian H, PA-C  omeprazole (PRILOSEC) 20 MG capsule Take 20 mg by mouth daily. 04/11/23   [provider]  SUMAtriptan  (IMITREX ) 50 MG tablet Take 50 mg by mouth daily as needed for headache.    [provider]  UBRELVY  100 MG TABS Take 100 mg by mouth daily as needed (migraine). 07/09/23   [provider]  valACYclovir  (VALTREX ) 500 MG tablet Take 500 mg by mouth daily as needed (cold sores). 04/17/22   [provider]  VELPHORO  500 MG chewable tablet Chew 1,000 mg by mouth 3 (three) times daily with meals. 06/21/23   [provider]  Vitamin D , Ergocalciferol , (DRISDOL ) 1.25 MG (50000 UNIT) CAPS capsule Take 50,000 Units by mouth every Sunday.    [provider]  escitalopram  (LEXAPRO ) 10 MG tablet Take 20 mg by mouth daily.  05/10/20 10/06/20  [provider]  furosemide  (LASIX ) 40 MG tablet Take 1 tablet (40 mg total) by mouth daily. Patient not taking: Reported on 02/16/2021 10/28/19 02/17/21  Varnado, Evelyn, MD  lisinopril  (ZESTRIL ) 10 MG tablet Take 10 mg by mouth daily.  02/23/20 10/06/20  [provider]  spironolactone  (ALDACTONE ) 25 MG tablet Take 25 mg by mouth daily.  04/20/20 10/06/20  [provider]    Physical Exam: BP (!) 157/145   Pulse (!) 116   Temp 98.1 F (36.7 C) (Rectal)   Resp (!) 38   SpO2 100%   General: 28 y.o. year-old female well developed well nourished in no acute distress.  Hypersomnolent, appears postictal. Cardiovascular: Tachycardic with no rubs or gallops.  No thyromegaly or JVD noted.  No lower extremity edema. 2/4 pulses in all 4 extremities. Respiratory: Clear to auscultation with no wheezes or  rales.  Poor inspiratory effort. Abdomen: Soft nontender nondistended with normal bowel sounds x4 quadrants. Muskuloskeletal: No cyanosis or clubbing noted bilaterally Neuro: CN II-XII intact, strength, sensation, reflexes Skin: No ulcerative lesions noted or rashes Psychiatry: Unable to assess mood or judgment due to hypersomnolence.         Labs on Admission:  Basic Metabolic Panel: Recent Labs  Lab 01/26/24 2305 01/26/24 2309  NA 137 136  K 4.1 4.0  CL 92* 96*  CO2 26  --   GLUCOSE 173* 182*  BUN 19 21*  CREATININE 5.59* 5.60*  CALCIUM  10.1  --    Liver Function Tests: No results for input(s): "AST", "ALT", "ALKPHOS", "BILITOT", "PROT", "ALBUMIN " in the last 168 hours. No results for input(s): "LIPASE", "AMYLASE" in the last 168 hours. No results for input(s): "AMMONIA" in the last 168 hours. CBC: Recent Labs  Lab 01/26/24 2305 01/26/24 2309  WBC 5.6  --   NEUTROABS 3.7  --   HGB 16.1* 17.3*  HCT 47.5* 51.0*  MCV 98.3  --   PLT 201  --    Cardiac Enzymes: No results for input(s): "CKTOTAL", "CKMB", "CKMBINDEX", "TROPONINI" in the last 168 hours.  BNP (last 3 results) Recent Labs    05/17/23 1632 05/27/23 0852 10/04/23 1126  BNP 2,673.9* >4,500.0* 1,455.6*    ProBNP (last 3 results) No results for input(s): "PROBNP" in the last 8760 hours.  CBG: Recent Labs  Lab 01/26/24 2250  GLUCAP 171*    Radiological Exams on Admission: CT Head Wo Contrast Result Date: 01/27/2024 EXAM: CT HEAD WITHOUT 01/27/2024 12:06:28 AM TECHNIQUE: CT of the head was performed without the administration of intravenous contrast. Automated exposure control, iterative reconstruction, and/or weight based adjustment of the mA/kV was utilized to reduce the radiation dose to as low as reasonably achievable. COMPARISON: 11/18/2023 CLINICAL HISTORY: Seizure, new-onset, no history of trauma. Chief complaints; Emesis. FINDINGS: BRAIN AND VENTRICLES: There is no acute intracranial  hemorrhage, mass effect or midline shift. No abnormal extra-axial fluid collection. The gray-white differentiation is maintained without evidence of an acute infarct. There is no evidence of hydrocephalus. ORBITS: The visualized portion of the orbits demonstrate no acute abnormality. SINUSES: The visualized paranasal sinuses and mastoid air cells demonstrate no acute abnormality. SOFT  TISSUES AND SKULL: No acute abnormality of the visualized skull or soft tissues. IMPRESSION: 1. No acute intracranial abnormality. Electronically signed by: Zadie Herter MD 01/27/2024 12:09 AM EDT RP Workstation: [id number]   DG Chest Portable 1 View Result Date: 01/26/2024 CLINICAL DATA:  Vomiting EXAM: PORTABLE CHEST 1 VIEW COMPARISON:  11/18/2023 FINDINGS: Shallow inspiration. Cardiac enlargement. No vascular congestion, edema, or consolidation. No pleural effusion or pneumothorax. Mediastinal contours appear intact. Right central venous catheter with tip over the cavoatrial junction region. Mediastinal contours appear intact. IMPRESSION: Cardiac enlargement.  No evidence of active pulmonary disease. Electronically Signed   By: Boyce Byes M.D.   On: 01/26/2024 23:54    EKG: I independently viewed the EKG done and my findings are as followed: Sinus tachycardia rate of 102.  Nonspecific ST-T changes.  QTc 514.  Assessment/Plan Present on Admission: **None**  Principal Problem:   New onset seizure (HCC)  New onset seizure, unclear etiology History of prior CVA Noncontrast head CT was unrevealing Follow noncontrast MRI brain and EEG IV Ativan  as needed for breakthrough seizures. Seizure, fall, and aspiration precautions. Continue Keppra with seizure precautions QTc is prolonged 514, consider switching to another AED if indicated. Neurology consulted  Acute metabolic encephalopathy in the setting of the above Appears postictal at the time of this visit. When more alert reorient as needed N.p.o. until  more alert.  QTc prolongation Admission 12-lead EKG with QTc 514 Optimize magnesium  and potassium levels Repeat twelve-lead EKG in the morning.  If worsening, consider switching Keppra for a different AED if okay with neurology Closely monitor on progressive care unit.  ESRD HD TTS Last hemodialysis session was on Saturday, 01/26/2024 Volume status and electrolytes managed with hemodialysis Consult nephrology to resume home schedule hemodialysis   Paroxysmal A-fib on Eliquis  Resume home Eliquis   Hyperlipidemia Resume home Lipitor  when no longer n.p.o.  Type 1 diabetes with hyperglycemia Last hemoglobin A1c 9.0 on 06/06/2023 Obtain hemoglobin A1c Start low-dose basal insulin  while n.p.o. Insulin  sliding scale every 4 hours while NPO.  Gastroparesis Hold off home Reglan  for now due to new onset seizure.  Migraines on Imitrex  Hold off Imitrex  for now  Chronic anxiety/depression Resume home regimen when home medications have been reconciled.  GERD Resume home regimen when no longer n.p.o.  Hypertension BP is not at goal IV antihypertensive as needed, with parameters, while n.p.o. Closely monitor vital signs   Critical care time: 55 minutes.   DVT prophylaxis: Home Eliquis .  Code Status: Full code.  Family Communication: Updated father at bedside.  Disposition Plan: Admitted to progressive care unit.  Consults called: Neurology consulted by EDP.  Admission status: Inpatient status.   Status is: Inpatient The patient requires at least 2 midnights for further evaluation and treatment of present condition.   Bary Boss MD [facility] Pager [phone]  If 7PM-7AM, please contact night-coverage [email or web address] Password [email or web address]  01/27/2024, 2:20 AM

## 2024-01-27 NOTE — ED Notes (Signed)
 Patient had sz like activity notified provider ativan  2 mg IVP ordered and admin as ordered. Patient remains on cardiac monitoring 2L [place] spo2 100%. Patient lethargic  provider at bedside

## 2024-01-27 NOTE — ED Notes (Signed)
 EEG at bedside.

## 2024-01-27 NOTE — Progress Notes (Signed)
 [facility] PLAN OF CARE NOTE Patient: Kathy Lewis ZOX:[id number]   PCP: Belva Boyden, PA DOB: 1996/02/15   DOA: 01/26/2024   DOS: 01/27/2024    Patient was admitted by my colleague earlier on 01/27/2024. I have reviewed the H&P as well as assessment and plan and agree with the same. Important changes in the plan are listed below.  Plan of care: Principal Problem:   New onset seizure (HCC) Active Problems:   Intractable nausea and vomiting   Essential hypertension   ESRD on dialysis (HCC)   Uncontrolled type 1 diabetes mellitus with hyperglycemia, with long-term current use of insulin  (HCC)   Prolonged QT interval   Chronic HFrEF (heart failure with reduced ejection fraction) (HCC)   Bipolar 2 disorder (HCC)   Gastroparesis   Internal jugular vein thrombosis, right (HCC)  New onset seizures. Neurology consulted. Currently on Keppra. Dose adjusted based on renal function. Management per neurology.  Intractable nausea and vomiting. Gastroparesis flareup. History of cyclical vomiting. Primary reason for patient's presentation was nausea and vomiting. Reassuringly CT abdomen is unremarkable. Continue scheduled Reglan . Continue Compazine  for nausea. IV Protonix . 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   History of DVT. Was on Eliquis . Will resume.  Type 1 diabetes mellitus, with hyperglycemia with long-term insulin  use via insulin  pump with nephropathy and gastroparesis. Insulin  pump have been removed. Currently on basal bolus regimen. Continue.  Recheck BMP.  ESRD on HD. Nephrology consulted. Will monitor.  Hypertensive urgency. Blood pressure is actually elevated right now. But at baseline in the hospital her blood pressure has been low in 80s and 90s. Continue to use as needed medication for now. Will discuss with nephrology  Level of care: Progressive  Author: Charlean Congress, MD  [facility] 01/27/2024 4:17 PM   If 7PM-7AM, please contact night-coverage at [email or web address]

## 2024-01-27 NOTE — Progress Notes (Signed)
 Patient is not available for EEG at the moment will be going to imaging.  Tech will check back on availability as schedule allows.

## 2024-01-27 NOTE — Consult Note (Signed)
 NEUROLOGY CONSULT NOTE   Date of service: January 27, 2024 Patient Name: Kathy Lewis MRN:  [id number] DOB:  Aug 05, 1996 Chief Complaint: New onset seizure Requesting Provider: Kraig Peru, MD  History of Present Illness  Kathy Lewis is a 28 y.o. female with hx of DM1, insulin  pump placement, anemia, anxiety, bipolar disorder, chronic systolic HF, depression, DKA in the past, ESRD on HD, HSV, prior stroke, migraine, medication noncompliance and prolonged QT syndrome who presents from home after a witnessed seizure there on Saturday. Her father states that he saw her shaking all over for about 10 minutes. He did not see any tongue biting or foaming at the mouth. In the context of father being a poor historian, there may not have been any postictal confusion per his best recollection. EMS reported that she had abdominal pain and N/V when she got home, prior to the seizure. She had had 2 L removed on Saturday. She was able to speak to staff on arrival and stated that she does not make any urine. CBG on arrival to the ED was 171.   While in the ED, she started having seizure-like activity lasting apoox 2-3 mins. Provider called to bedside 2 mg ativan  IM ordered and admin as ordered. She was patient lethargic after the seizure and not responding to commands. On EDP exam she appeared postictal. She was loaded with Keppra and started on 500 mg IV Keppra BID.   Head CT showed no acute abnormality.   After CT she had another seizure.  2 mg IV Ativan  was administered.  She continued to be lethargic.  Home medications include Eliquis  and Klonopin  for anxiety.     ROS  ROS is limited by patient lethargy at this time.   Past History   Past Medical History:  Diagnosis Date   Anemia    Anxiety    Bipolar 2 disorder (HCC)    Chronic kidney disease    Chronic systolic (congestive) heart failure (HCC)    Depression    DKA (diabetic ketoacidoses)    ESRD on peritoneal dialysis (HCC)    HSV infection     on valtrex    Hypokalemia    Leukocytosis    Migraine    Noncompliance with medication regimen    Preeclampsia    Prolonged QT syndrome    Severe anemia    Type 1 diabetes mellitus (HCC)     Past Surgical History:  Procedure Laterality Date   BIOPSY  04/24/2022   Procedure: BIOPSY;  Surgeon: Elois Hair, MD;  Location: [facility] ENDOSCOPY;  Service: Gastroenterology;;   CARDIAC CATHETERIZATION     COLONOSCOPY WITH PROPOFOL  N/A 04/24/2022   Procedure: COLONOSCOPY WITH PROPOFOL ;  Surgeon: Elois Hair, MD;  Location: [facility] ENDOSCOPY;  Service: Gastroenterology;  Laterality: N/A;   DILATION AND EVACUATION N/A 10/22/2019   Procedure: ULTRASOUND GUIDED DILATATION AND EVACUATION;  Surgeon: Johnn Najjar, MD;  Location: [facility] LD ORS;  Service: Gynecology;  Laterality: N/A;   ESOPHAGOGASTRODUODENOSCOPY (EGD) WITH PROPOFOL  N/A 04/24/2022   Procedure: ESOPHAGOGASTRODUODENOSCOPY (EGD) WITH PROPOFOL ;  Surgeon: Elois Hair, MD;  Location: [facility] ENDOSCOPY;  Service: Gastroenterology;  Laterality: N/A;   IR FLUORO GUIDE CV LINE RIGHT  09/14/2022   IR US  GUIDE VASC ACCESS RIGHT  09/14/2022   peritoneal dialysis catheter insertion     RENAL BIOPSY      Family History: Family History  Adopted: Yes  Problem Relation Age of Onset   Heart disease Neg Hx     Social  History  reports that she has quit smoking. Her smoking use included cigars and cigarettes. She has a 2 pack-year smoking history. She has never used smokeless tobacco. She reports that she does not drink alcohol and does not use drugs.  Allergies  Allergen Reactions   Cantaloupe Extract Allergy Skin Test Itching and Other (See Comments)    Mouth itching     Citrullus Vulgaris Itching and Other (See Comments)    Makes mouth itch, ALL melons     Food Itching and Other (See Comments)    All melon - mouth itching   Strawberry Extract Itching and Other (See Comments)    Mouth itching   Nsaids Itching and Other (See  Comments)    Avoid per nephrology    Medications   Current Facility-Administered Medications:    acetaminophen  (TYLENOL ) tablet 650 mg, 650 mg, Oral, Q6H PRN, Hall, Carole N, DO   amitriptyline  (ELAVIL ) tablet 50 mg, 50 mg, Oral, QHS, Patel, Pranav M, MD   apixaban  (ELIQUIS ) tablet 2.5 mg, 2.5 mg, Oral, BID, Hall, Carole N, DO, 2.5 mg at 01/27/24 1610   insulin  aspart (novoLOG ) injection 0-15 Units, 0-15 Units, Subcutaneous, Q4H, Patel, Pranav M, MD   insulin  glargine-yfgn (SEMGLEE ) injection 5 Units, 5 Units, Subcutaneous, Daily, Hall, Carole N, DO   levETIRAcetam (KEPPRA) undiluted injection 500 mg, 500 mg, Intravenous, BID, Hall, Carole N, DO   LORazepam  (ATIVAN ) injection 2 mg, 2 mg, Intravenous, Q6H PRN, Hall, Carole N, DO   melatonin tablet 5 mg, 5 mg, Oral, QHS PRN, Hall, Carole N, DO   metoprolol  tartrate (LOPRESSOR ) injection 2.5 mg, 2.5 mg, Intravenous, Q6H PRN, Hall, Carole N, DO   polyethylene glycol (MIRALAX  / GLYCOLAX ) packet 17 g, 17 g, Oral, Daily PRN, Hall, Carole N, DO   prochlorperazine  (COMPAZINE ) injection 5 mg, 5 mg, Intravenous, Q6H PRN, Bary Boss, DO  Current Outpatient Medications:    acetaminophen  (TYLENOL ) 500 MG tablet, Take 1,000 mg by mouth daily as needed for headache (pain)., Disp: , Rfl:    amitriptyline  (ELAVIL ) 50 MG tablet, Take 1 tablet (50 mg total) by mouth at bedtime., Disp: 30 tablet, Rfl: 3   atorvastatin  (LIPITOR ) 40 MG tablet, Take 1 tablet (40 mg total) by mouth every evening., Disp: 30 tablet, Rfl: 11   B Complex-C-Folic Acid  (RENA-VITE RX) 1 MG TABS, Take 1 tablet by mouth daily., Disp: , Rfl:    calcium  acetate (PHOSLO ) 667 MG capsule, Take 1,334 mg by mouth 3 (three) times daily with meals., Disp: , Rfl:    carvedilol  (COREG ) 3.125 MG tablet, Take 1 tablet (3.125 mg total) by mouth 2 (two) times daily with a meal., Disp: 60 tablet, Rfl: 0   clonazePAM  (KLONOPIN ) 0.5 MG tablet, Take 1 tablet (0.5 mg total) by mouth 2 (two) times daily as  needed for anxiety. (Patient taking differently: Take 0.5 mg by mouth at bedtime.), Disp: 20 tablet, Rfl: 0   ELIQUIS  2.5 MG TABS tablet, Take 2.5 mg by mouth 2 (two) times daily., Disp: , Rfl:    hydrOXYzine  (ATARAX ) 10 MG tablet, Take 10 mg by mouth daily., Disp: , Rfl:    insulin  lispro (HUMALOG ) 100 UNIT/ML injection, Inject 0.5 Units into the skin See admin instructions. 0.5 units per hour via insulin  pump., Disp: , Rfl:    isosorbide  mononitrate (IMDUR ) 30 MG 24 hr tablet, Take 0.5 tablets (15 mg total) by mouth daily., Disp: 30 tablet, Rfl: 0   magnesium  oxide (MAG-OX) 400 MG tablet, Take 2  tablets (800 mg total) by mouth daily., Disp: 60 tablet, Rfl: 6   methocarbamol  (ROBAXIN ) 500 MG tablet, Take 500 mg by mouth as needed for muscle spasms., Disp: , Rfl:    metoCLOPramide  (REGLAN ) 10 MG tablet, Take 1 tablet (10 mg total) by mouth every 6 (six) hours., Disp: 30 tablet, Rfl: 0   omeprazole (PRILOSEC) 20 MG capsule, Take 20 mg by mouth daily., Disp: , Rfl:    SUMAtriptan  (IMITREX ) 50 MG tablet, Take 50 mg by mouth daily as needed for headache., Disp: , Rfl:    UBRELVY  100 MG TABS, Take 100 mg by mouth daily as needed (migraine)., Disp: , Rfl:    valACYclovir  (VALTREX ) 500 MG tablet, Take 500 mg by mouth daily as needed (cold sores)., Disp: , Rfl:    VELPHORO  500 MG chewable tablet, Chew 1,000 mg by mouth 3 (three) times daily with meals., Disp: , Rfl:    Vitamin D , Ergocalciferol , (DRISDOL ) 1.25 MG (50000 UNIT) CAPS capsule, Take 50,000 Units by mouth every Sunday., Disp: , Rfl:   Vitals   Vitals:   01/27/24 0130 01/27/24 0223 01/27/24 0230 01/27/24 0645  BP: (!) 157/145 (!) 160/122 (!) 167/126 (!) 166/128  Pulse:   97 (!) 103  Resp: (!) 38  (!) 27 18  Temp:    98.6 F (37 C)  TempSrc:    Oral  SpO2:   100% 100%    There is no height or weight on file to calculate BMI.  Physical Exam   Constitutional: Ill-appearing female in NAD.  Psych: Flat affect  Eyes: No scleral  injection.  HENT: No OP obstruction. No neck stiffness.  Head: Normocephalic.  Respiratory: Effort normal, non-labored breathing.  Skin: WDI. No rash seen.   Neurologic Examination   Mental Status: Lethargic, but oriented x 5. Speech is sparse but fluent. Naming intact. Comprehension intact. Able to follow all commands but requires encouragement/coaching.  Cranial Nerves: II: Will make eye contact. PERRL  III,IV, VI: No ptosis. EOMI. No nystagmus V: FT sensation equal bilaterally  VII: Face is symmetric VIII: Hearing intact to voice IX,X: No hoarseness XI: Symmetric  XII: No lingual dysarthria Motor: BUE 4/5 proximally and distally. Poor effort BLE 4/5 proximally and distally. Poor effort.  Sensory: Light touch intact throughout, bilaterally.   Deep Tendon Reflexes: 1+ and symmetric throughout. Toes mute.  Cerebellar: No ataxia with FNF bilaterally, but slow.  Gait: Deferred  Labs/Imaging/Neurodiagnostic studies   CBC:  Recent Labs  Lab 02/20/2024 2305 02/20/24 2309 01/27/24 0553  WBC 5.6  --  6.8  NEUTROABS 3.7  --   --   HGB 16.1* 17.3* 15.0  HCT 47.5* 51.0* 44.5  MCV 98.3  --  98.5  PLT 201  --  177   Basic Metabolic Panel:  Lab Results  Component Value Date   NA 135 01/27/2024   K 5.1 01/27/2024   CO2 25 01/27/2024   GLUCOSE 252 (H) 01/27/2024   BUN 28 (H) 01/27/2024   CREATININE 6.46 (H) 01/27/2024   CALCIUM  10.1 01/27/2024   GFRNONAA 8 (L) 01/27/2024   GFRAA 52 (L) 10/28/2019   Lipid Panel: No results found for: "LDLCALC" HgbA1c:  Lab Results  Component Value Date   HGBA1C 8.1 (H) 01/27/2024   Urine Drug Screen: No results found for: "LABOPIA", "COCAINSCRNUR", "LABBENZ", "AMPHETMU", "THCU", "LABBARB"  Alcohol Level     Component Value Date/Time   ETH <10 05/26/2023 1337   INR  Lab Results  Component Value Date  INR 1.0 10/22/2019   APTT  Lab Results  Component Value Date   APTT 28 10/22/2019     ASSESSMENT  Veda Heslin is a 28 y.o.  female with a history of CVA, DM1, ESRD on HD who presents with new onset seizures. One seizure occurred at home and two were witnessed in the ED, with postictal state.  - Exam reveals a lethargic patient who is fully oriented. Poor effort on exam with maximum strength elicited at 4/5 x 4 without asymmetry.  - CT head: There is no acute intracranial hemorrhage, mass effect or midline shift. No abnormal extra-axial fluid collection. The gray-white differentiation is maintained without evidence of an acute infarct. There is no evidence of hydrocephalus. - Labs: Na, K and total Ca are normal, but ionized Ca is low at 1.02. Cr elevated consistent with her diagnosis of ESRD. Glucose 173. WBC normal. Serum pregnancy test is negative.  - Impression: No onset seizures. DDx includes Klonopin  withdrawal and cryptogenic new-onset epilepsy.    RECOMMENDATIONS  - EEG is pending.  - MRI brain WITHOUT contrast - Inpatient and outpatient seizure precautions - Continue Keppra, but decrease dosing to every day in the context of her ESRD. Order changed to 500 mg every day with supplemental dose of 250 mg after dialysis.   ______________________________________________________________________    Hope Ly, Dann Ventress, MD [facility]

## 2024-01-27 NOTE — Progress Notes (Signed)
 Late entry. Informed Dr. Lydia Sams about hyperglycemia for afternoon CBG. BMP ordered.  Will continue plan of care.  Roe Clare, RN

## 2024-01-27 NOTE — ED Notes (Signed)
 Insulin  pump removed and given to father to take home.

## 2024-01-27 NOTE — Procedures (Signed)
 Patient Name: Kathy Lewis  MRN: [id number]  Epilepsy Attending: Arleene Lack  Referring Physician/Provider: Bary Boss, DO  Date: 01/27/2024 Duration: 22.24 min  Patient history: 28yo F with seizure like activity. EEG to evaluate for seizure.  Level of alertness: Awake, asleep  AEDs during EEG study: LEV, Ativan   Technical aspects: This EEG study was done with scalp electrodes positioned according to the 10-20 International system of electrode placement. Electrical activity was reviewed with band pass filter of 1-70Hz , sensitivity of 7 uV/mm, display speed of 5mm/sec with a 60Hz  notched filter applied as appropriate. EEG data were recorded continuously and digitally stored.  Video monitoring was available and reviewed as appropriate.  Description: The posterior dominant rhythm consists of 9 Hz activity of moderate voltage (25-35 uV) seen predominantly in posterior head regions, symmetric and reactive to eye opening and eye closing.  Sleep was characterized by vertex waves, sleep spindles (12 to 14 Hz), maximal frontocentral region.    Patient was noted to have episodes of brief sudden whole body jerking during photic stimulation. Concomitant EEG before, during and after the event did not show any EEG changes suggest seizure.  Physiologic photic driving was seen during photic stimulation.  Hyperventilation was not performed.     IMPRESSION: This study is within normal limits. No seizures or epileptiform discharges were seen throughout the recording.  Patient was noted to have episodes of brief sudden whole body jerking during photic stimulation without concomitant EEG change. These were NON epileptic events.  A normal interictal EEG does not exclude the diagnosis of epilepsy.   Dalton Mille O Zendaya Groseclose

## 2024-01-28 ENCOUNTER — Inpatient Hospital Stay (HOSPITAL_COMMUNITY)

## 2024-01-28 ENCOUNTER — Encounter (HOSPITAL_COMMUNITY): Payer: Self-pay | Admitting: Internal Medicine

## 2024-01-28 ENCOUNTER — Ambulatory Visit: Admitting: Podiatry

## 2024-01-28 DIAGNOSIS — E1022 Type 1 diabetes mellitus with diabetic chronic kidney disease: Secondary | ICD-10-CM | POA: Diagnosis not present

## 2024-01-28 DIAGNOSIS — E878 Other disorders of electrolyte and fluid balance, not elsewhere classified: Secondary | ICD-10-CM

## 2024-01-28 DIAGNOSIS — N186 End stage renal disease: Secondary | ICD-10-CM | POA: Diagnosis not present

## 2024-01-28 DIAGNOSIS — R569 Unspecified convulsions: Secondary | ICD-10-CM | POA: Diagnosis not present

## 2024-01-28 LAB — COMPREHENSIVE METABOLIC PANEL WITH GFR
ALT: 16 U/L (ref 0–44)
AST: 16 U/L (ref 15–41)
Albumin: 4.2 g/dL (ref 3.5–5.0)
Alkaline Phosphatase: 98 U/L (ref 38–126)
Anion gap: 25 — ABNORMAL HIGH (ref 5–15)
BUN: 52 mg/dL — ABNORMAL HIGH (ref 6–20)
CO2: 21 mmol/L — ABNORMAL LOW (ref 22–32)
Calcium: 9.7 mg/dL (ref 8.9–10.3)
Chloride: 88 mmol/L — ABNORMAL LOW (ref 98–111)
Creatinine, Ser: 9.93 mg/dL — ABNORMAL HIGH (ref 0.44–1.00)
GFR, Estimated: 5 mL/min — ABNORMAL LOW (ref >60)
Glucose, Bld: 365 mg/dL — ABNORMAL HIGH (ref 70–99)
Potassium: 6 mmol/L — ABNORMAL HIGH (ref 3.5–5.1)
Sodium: 133 mmol/L — ABNORMAL LOW (ref 135–145)
Total Bilirubin: 1.3 mg/dL — ABNORMAL HIGH (ref 0.0–1.2)
Total Protein: 8.3 g/dL — ABNORMAL HIGH (ref 6.5–8.1)

## 2024-01-28 LAB — CBC WITH DIFFERENTIAL/PLATELET
Abs Immature Granulocytes: 0.04 10*3/uL (ref 0.00–0.07)
Basophils Absolute: 0 10*3/uL (ref 0.0–0.1)
Basophils Relative: 0 %
Eosinophils Absolute: 0 10*3/uL (ref 0.0–0.5)
Eosinophils Relative: 0 %
HCT: 42.8 % (ref 36.0–46.0)
Hemoglobin: 14.7 g/dL (ref 12.0–15.0)
Immature Granulocytes: 1 %
Lymphocytes Relative: 10 %
Lymphs Abs: 0.8 10*3/uL (ref 0.7–4.0)
MCH: 33.7 pg (ref 26.0–34.0)
MCHC: 34.3 g/dL (ref 30.0–36.0)
MCV: 98.2 fL (ref 80.0–100.0)
Monocytes Absolute: 0.6 10*3/uL (ref 0.1–1.0)
Monocytes Relative: 7 %
Neutro Abs: 7.2 10*3/uL (ref 1.7–7.7)
Neutrophils Relative %: 82 %
Platelets: 215 10*3/uL (ref 150–400)
RBC: 4.36 MIL/uL (ref 3.87–5.11)
RDW: 13.5 % (ref 11.5–15.5)
WBC: 8.7 10*3/uL (ref 4.0–10.5)
nRBC: 0 % (ref 0.0–0.2)

## 2024-01-28 LAB — GLUCOSE, CAPILLARY
Glucose-Capillary: 347 mg/dL — ABNORMAL HIGH (ref 70–99)
Glucose-Capillary: 224 mg/dL — ABNORMAL HIGH (ref 70–99)
Glucose-Capillary: 238 mg/dL — ABNORMAL HIGH (ref 70–99)
Glucose-Capillary: 50 mg/dL — ABNORMAL LOW (ref 70–99)
Glucose-Capillary: 248 mg/dL — ABNORMAL HIGH (ref 70–99)
Glucose-Capillary: 451 mg/dL — ABNORMAL HIGH (ref 70–99)
Glucose-Capillary: 341 mg/dL — ABNORMAL HIGH (ref 70–99)
Glucose-Capillary: 219 mg/dL — ABNORMAL HIGH (ref 70–99)
Glucose-Capillary: 118 mg/dL — ABNORMAL HIGH (ref 70–99)
Glucose-Capillary: 38 mg/dL — CL (ref 70–99)
Glucose-Capillary: 42 mg/dL — CL (ref 70–99)
Glucose-Capillary: 48 mg/dL — ABNORMAL LOW (ref 70–99)
Glucose-Capillary: 152 mg/dL — ABNORMAL HIGH (ref 70–99)

## 2024-01-28 LAB — LIPASE, BLOOD: Lipase: 24 U/L (ref 11–51)

## 2024-01-28 LAB — HEPATITIS B SURFACE ANTIGEN: Hepatitis B Surface Ag: NONREACTIVE

## 2024-01-28 LAB — TSH: TSH: 1.032 u[IU]/mL (ref 0.350–4.500)

## 2024-01-28 LAB — MAGNESIUM: Magnesium: 2.5 mg/dL — ABNORMAL HIGH (ref 1.7–2.4)

## 2024-01-28 LAB — T4, FREE: Free T4: 1.25 ng/dL — ABNORMAL HIGH (ref 0.61–1.12)

## 2024-01-28 MED ORDER — HYDRALAZINE HCL 20 MG/ML IJ SOLN
10.0000 mg | Freq: Four times a day (QID) | INTRAMUSCULAR | Status: DC | PRN
  Administered 2024-01-29 – 2024-01-30 (×2): 10 mg via INTRAVENOUS
  Filled 2024-01-28: qty 1

## 2024-01-28 MED ORDER — HEPARIN SODIUM (PORCINE) 1000 UNIT/ML DIALYSIS: 1000.0000 [IU] | INTRAMUSCULAR | Status: DC | PRN

## 2024-01-28 MED ORDER — NEPRO/CARBSTEADY PO LIQD
237.0000 mL | ORAL | Status: DC | PRN
Start: 2024-01-28 — End: 2024-01-28

## 2024-01-28 MED ORDER — INSULIN GLARGINE-YFGN 100 UNIT/ML ~~LOC~~ SOLN
12.0000 [IU] | Freq: Every day | SUBCUTANEOUS | Status: DC
  Administered 2024-01-28: 12 [IU] via SUBCUTANEOUS
  Filled 2024-01-28 (×2): qty 0.12

## 2024-01-28 MED ORDER — HEPARIN SODIUM (PORCINE) 1000 UNIT/ML IJ SOLN
INTRAMUSCULAR | Status: AC
Start: 2024-01-28 — End: ?
  Filled 2024-01-28: qty 3

## 2024-01-28 MED ORDER — CHLORHEXIDINE GLUCONATE CLOTH 2 % EX PADS
6.0000 | MEDICATED_PAD | Freq: Every day | CUTANEOUS | Status: DC
  Administered 2024-01-29 – 2024-01-31 (×3): 6 via TOPICAL

## 2024-01-28 MED ORDER — FENTANYL CITRATE PF 50 MCG/ML IJ SOSY
25.0000 ug | PREFILLED_SYRINGE | INTRAMUSCULAR | Status: DC | PRN
  Administered 2024-01-29 – 2024-01-31 (×4): 25 ug via INTRAVENOUS
  Filled 2024-01-28 (×4): qty 1

## 2024-01-28 MED ORDER — CARVEDILOL 3.125 MG PO TABS
3.1250 mg | ORAL_TABLET | Freq: Two times a day (BID) | ORAL | Status: DC
  Filled 2024-01-28 (×3): qty 1

## 2024-01-28 MED ORDER — NALOXONE HCL 0.4 MG/ML IJ SOLN: 0.4000 mg | INTRAMUSCULAR | Status: DC | PRN

## 2024-01-28 MED ORDER — LORAZEPAM 2 MG/ML IJ SOLN: 0.5000 mg | Freq: Once | INTRAMUSCULAR | Status: DC

## 2024-01-28 MED ORDER — HEPARIN SODIUM (PORCINE) 1000 UNIT/ML DIALYSIS
1000.0000 [IU] | INTRAMUSCULAR | Status: AC | PRN
  Administered 2024-01-28: 1000 [IU] via INTRAVENOUS_CENTRAL

## 2024-01-28 MED ORDER — HEPARIN SODIUM (PORCINE) 1000 UNIT/ML DIALYSIS: 2500.0000 [IU] | Freq: Once | INTRAMUSCULAR | Status: DC

## 2024-01-28 MED ORDER — ALTEPLASE 2 MG IJ SOLR: 2.0000 mg | Freq: Once | INTRAMUSCULAR | Status: DC | PRN

## 2024-01-28 MED ORDER — DEXTROSE 50 % IV SOLN
25.0000 g | INTRAVENOUS | Status: AC
Start: 2024-01-28 — End: 2024-01-28
  Administered 2024-01-28: 25 g via INTRAVENOUS
  Filled 2024-01-28: qty 50

## 2024-01-28 MED ORDER — DEXTROSE 50 % IV SOLN
25.0000 g | INTRAVENOUS | Status: AC
  Administered 2024-01-28: 25 g via INTRAVENOUS
  Filled 2024-01-28: qty 50

## 2024-01-28 MED ORDER — HEPARIN SODIUM (PORCINE) 1000 UNIT/ML IJ SOLN
INTRAMUSCULAR | Status: AC
  Filled 2024-01-28: qty 4

## 2024-01-28 MED ORDER — HEPARIN SODIUM (PORCINE) 1000 UNIT/ML DIALYSIS
2500.0000 [IU] | Freq: Once | INTRAMUSCULAR | Status: DC
Start: 2024-01-28 — End: 2024-01-28

## 2024-01-28 NOTE — Evaluation (Addendum)
 Physical Therapy Evaluation Patient Details Name: Kathy Lewis MRN: [id number] DOB: Jan 12, 1996 Today's Date: 01/28/2024  History of Present Illness  Patient is a 28 year old female presenting with new onset seizure with postictal state. MRI of brain reported new cortical T2 hyperintensity along the right parieto-occipital junction,  suggestive of late subacute to chronic infarct, and unchanged old infarct.  PMH: CVA, DM1, ESRD on HD.  Clinical Impression  Patient is lethargic during session and needs external stimulation to maintain alertness for participation. She reports she lives with her dad and her 56 year old daughter. She is independent at baseline and reports driving herself to dialysis.  Today the patient keeps her eyes closed most of the time. She reports mild dizziness with mobility with blood pressure of 118/94. She was able to stand with minimal assistance. Standing tolerance is limited by lethargy and patient declined walking at this time. Heart rate up to 121bpm. Anticipate increased activity tolerance when less lethargic. Recommend to continue PT to maximize independence and decrease caregiver burden.       If plan is discharge home, recommend the following: Assistance with cooking/housework;Assist for transportation;Help with stairs or ramp for entrance   Can travel by private vehicle        Equipment Recommendations None recommended by PT  Recommendations for Other Services       Functional Status Assessment Patient has had a recent decline in their functional status and demonstrates the ability to make significant improvements in function in a reasonable and predictable amount of time.     Precautions / Restrictions Precautions Precautions: Fall Recall of Precautions/Restrictions: Intact Restrictions Weight Bearing Restrictions Per Provider Order: No      Mobility  Bed Mobility Overal bed mobility: Needs Assistance Bed Mobility: Supine to Sit, Sit to Supine      Supine to sit: Contact guard Sit to supine: Contact guard assist   General bed mobility comments: increased time and effort required. mild dizziness reported initially with sitting upright. blood pressure 118/94    Transfers Overall transfer level: Needs assistance Equipment used: 1 person hand held assist Transfers: Sit to/from Stand Sit to Stand: Min assist           General transfer comment: cues for task initiation    Ambulation/Gait               General Gait Details: patient reports she feels too tired to try walking. heart rate up to 121bpm while standing  Stairs            Wheelchair Mobility     Tilt Bed    Modified Rankin (Stroke Patients Only)       Balance Overall balance assessment: Needs assistance Sitting-balance support: Feet supported, No upper extremity supported Sitting balance-Leahy Scale: Fair Sitting balance - Comments: stimulation required to maintain alertness with mobility and patient often closing her eyes                                     Pertinent Vitals/Pain Pain Assessment Pain Assessment: No/denies pain    Home Living Family/patient expects to be discharged to:: Private residence Living Arrangements: Parent;Children (her dad and a 58 year old daughter) Available Help at Discharge: Family Type of Home: House Home Access: Level entry       Home Layout: One level        Prior Function Prior Level of Function : Independent/Modified  Independent;Driving             Mobility Comments: patient reports she is independent at baseline and drives herself to dialysis       Extremity/Trunk Assessment   Upper Extremity Assessment Upper Extremity Assessment: Generalized weakness;Right hand dominant;Difficult to assess due to impaired cognition (grip strength on the right appears weaker than left. unable to follow commands for formal MMT due to level of alertness)    Lower Extremity  Assessment Lower Extremity Assessment: Generalized weakness;Difficult to assess due to impaired cognition (patient able to stand without knee buckling. unable to formall MMT due to level of arousal)       Communication   Communication Communication: No apparent difficulties    Cognition Arousal: Lethargic Behavior During Therapy: Flat affect   PT - Cognitive impairments: No family/caregiver present to determine baseline, Difficult to assess, Initiation, Sequencing Difficult to assess due to: Level of arousal                     PT - Cognition Comments: patient keeps eyes closed unless providing some type of external stimulation Following commands: Impaired Following commands impaired: Follows one step commands with increased time     Cueing Cueing Techniques: Verbal cues, Tactile cues     General Comments      Exercises     Assessment/Plan    PT Assessment Patient needs continued PT services  PT Problem List Decreased strength;Decreased activity tolerance;Decreased balance;Decreased mobility;Decreased cognition;Decreased safety awareness       PT Treatment Interventions DME instruction;Gait training;Stair training;Functional mobility training;Therapeutic activities;Therapeutic exercise;Balance training;Neuromuscular re-education;Cognitive remediation;Patient/family education    PT Goals (Current goals can be found in the Care Plan section)  Acute Rehab PT Goals Patient Stated Goal: none stated PT Goal Formulation: Patient unable to participate in goal setting Time For Goal Achievement: 02/11/24 Potential to Achieve Goals: Fair    Frequency Min 2X/week     Co-evaluation               AM-PAC PT "6 Clicks" Mobility  Outcome Measure Help needed turning from your back to your side while in a flat bed without using bedrails?: None Help needed moving from lying on your back to sitting on the side of a flat bed without using bedrails?: A Little Help  needed moving to and from a bed to a chair (including a wheelchair)?: A Little Help needed standing up from a chair using your arms (e.g., wheelchair or bedside chair)?: A Little Help needed to walk in hospital room?: A Little Help needed climbing 3-5 steps with a railing? : A Little 6 Click Score: 19    End of Session Equipment Utilized During Treatment: Oxygen Activity Tolerance: Patient limited by fatigue;Patient limited by lethargy Patient left: in bed;with call bell/phone within reach;with bed alarm set Nurse Communication: Mobility status PT Visit Diagnosis: Muscle weakness (generalized) (M62.81);Unsteadiness on feet (R26.81)    Time: 1210-1222 PT Time Calculation (min) (ACUTE ONLY): 12 min   Charges:   PT Evaluation $PT Eval Moderate Complexity: 1 Mod   PT General Charges $$ ACUTE PT VISIT: 1 Visit        Ozie Bo, PT, MPT   Erlene Hawks 01/28/2024, 1:53 PM

## 2024-01-28 NOTE — Progress Notes (Signed)
 Patient to dialysis at this time.

## 2024-01-28 NOTE — Progress Notes (Signed)
 Patient unavailable for overnight eeg setup due to being in dialysis. Reached out to nurse and she will contact me when the patient is available.

## 2024-01-28 NOTE — Progress Notes (Signed)
 LTM EEG hooked up and running - no initial skin breakdown - push button tested - [facility] monitoring.

## 2024-01-28 NOTE — Procedures (Signed)
 HD Note:  Some information was entered later than the data was gathered due to patient care needs. The stated time with the data is accurate.  Received patient in bed to unit.   Patient responsive to voice and stated she felt ok.  Patient disoriented.   Informed consent signed and in chart. Patient's father was called to obtain verbal consent heard by two nurses for consent.  Access used: right upper chest HD catheter Access issues: None  Patient hypotensive during treatment.  Dr. Carolan Chuck present.  Orders given to turn off UF and give fluid until SBP is > 100.  Patient's bed remained in Trendelenberg position from the time of the onset of hypotension.  TX duration: 3.5 hours  Alert, without acute distress.  Patient was given a 800 ml of NS during the treatment.  Hand-off given to patient's nurse.   Transported back to the room   Torra Pala L. Alva Jewels, RN [facility].

## 2024-01-28 NOTE — Progress Notes (Signed)
 [facility] night cross cover note:   I was notified by the patient's RN that the patient is experiencing some nausea, vomiting, as well as some abdominal discomfort, with the nausea refractory to dose of Compazine  per existing prn order.  For her abdominal discomfort, added fentanyl  25 mcg IV every 2 hours as needed.  Regarding her residual nausea, antiemetic options are limited by the presence of QTc prolongation, with most recent EKG showing QTc of 507 ms. in light of this, I have ordered Ativan  0.5 mg iv x 1 dose now to try to attend to her residual nausea.       Camelia Cavalier, DO Hospitalist

## 2024-01-28 NOTE — Consult Note (Addendum)
 Renal Service Consult Note [facility]  Kathy Lewis 01/28/2024 Kathy Sandifer, MD Requesting Physician: Dr. Zelda Hickman, [place].   Reason for Consult: ESRD pt w/  HPI: The patient is a 28 y.o. year-old w/ PMH as below who presented to ED Sat 5/31 w/ c/o abd pain and N/V. Had HD earlier in the day, they took of 2 L. Hx of DM on insulin  pump. In the ED pt started to have seizure-like activity. Pt given IV ativan  w/ improvement. Then had another seizure and IV keppra was started. No prior hx of seizures. CT head was negative. Pt was admitted to hospitalist service on 6/01. Pt was seen by neurology yesterday who felt pt had new onset seizures, ddx klonopin  withdrawal vs cryptogenic new-onset epilepsy. We are asked to see for ESRD.    Pt seen in room. She looks sedated but opens her eyes to voice and responds appropriately. Denies any CP or HA.    ROS - denies CP, no joint pain, no HA, no blurry vision, no rash, no diarrhea, no nausea/ vomiting  PMH: Anemia Anxiety Bipolar d/o ESRD on HD Chronic systolic HF Depression DKA Prolonged QT syndrome DM type 1  Past Surgical History  Past Surgical History:  Procedure Laterality Date   BIOPSY  04/24/2022   Procedure: BIOPSY;  Surgeon: Elois Hair, MD;  Location: [facility] ENDOSCOPY;  Service: Gastroenterology;;   CARDIAC CATHETERIZATION     COLONOSCOPY WITH PROPOFOL  N/A 04/24/2022   Procedure: COLONOSCOPY WITH PROPOFOL ;  Surgeon: Elois Hair, MD;  Location: [facility] ENDOSCOPY;  Service: Gastroenterology;  Laterality: N/A;   DILATION AND EVACUATION N/A 10/22/2019   Procedure: ULTRASOUND GUIDED DILATATION AND EVACUATION;  Surgeon: Johnn Najjar, MD;  Location: [facility] LD ORS;  Service: Gynecology;  Laterality: N/A;   ESOPHAGOGASTRODUODENOSCOPY (EGD) WITH PROPOFOL  N/A 04/24/2022   Procedure: ESOPHAGOGASTRODUODENOSCOPY (EGD) WITH PROPOFOL ;  Surgeon: Elois Hair, MD;  Location: [facility] ENDOSCOPY;  Service: Gastroenterology;  Laterality:  N/A;   IR FLUORO GUIDE CV LINE RIGHT  09/14/2022   IR US  GUIDE VASC ACCESS RIGHT  09/14/2022   peritoneal dialysis catheter insertion     RENAL BIOPSY     Family History  Family History  Adopted: Yes  Problem Relation Age of Onset   Heart disease Neg Hx    Social History  reports that she has quit smoking. Her smoking use included cigars and cigarettes. She has a 2 pack-year smoking history. She has never used smokeless tobacco. She reports that she does not drink alcohol and does not use drugs. Allergies  Allergies  Allergen Reactions   Cantaloupe Extract Allergy Skin Test Itching and Other (See Comments)    Mouth itching     Citrullus Vulgaris Itching and Other (See Comments)    Makes mouth itch, ALL melons     Food Itching and Other (See Comments)    All melon - mouth itching   Strawberry Extract Itching and Other (See Comments)    Mouth itching   Nsaids Itching and Other (See Comments)    Avoid per nephrology   Home medications Prior to Admission medications   Medication Sig Start Date End Date Taking? Authorizing Provider  acetaminophen  (TYLENOL ) 500 MG tablet Take 1,000 mg by mouth daily as needed for headache (pain).    [provider]  amitriptyline  (ELAVIL ) 50 MG tablet Take 1 tablet (50 mg total) by mouth at bedtime. 08/30/22   Arlyne Bering, NP  atorvastatin  (LIPITOR ) 40 MG tablet Take 1 tablet (40  mg total) by mouth every evening. 05/07/23   Bensimhon, Rheta Celestine, MD  B Complex-C-Folic Acid  (RENA-VITE RX) 1 MG TABS Take 1 tablet by mouth daily. 01/01/23   [provider]  calcium  acetate (PHOSLO ) 667 MG capsule Take 1,334 mg by mouth 3 (three) times daily with meals. 09/28/22   [provider]  carvedilol  (COREG ) 3.125 MG tablet Take 1 tablet (3.125 mg total) by mouth 2 (two) times daily with a meal. 08/07/23   Amin, Ankit C, MD  clonazePAM  (KLONOPIN ) 0.5 MG tablet Take 1 tablet (0.5 mg total) by mouth 2 (two) times daily as needed for  anxiety. Patient taking differently: Take 0.5 mg by mouth at bedtime. 03/16/22   Singh, Prashant K, MD  ELIQUIS  2.5 MG TABS tablet Take 2.5 mg by mouth 2 (two) times daily.    [provider]  hydrOXYzine  (ATARAX ) 10 MG tablet Take 10 mg by mouth daily.    [provider]  insulin  lispro (HUMALOG ) 100 UNIT/ML injection Inject 0.5 Units into the skin See admin instructions. 0.5 units per hour via insulin  pump. 01/08/22   [provider]  isosorbide  mononitrate (IMDUR ) 30 MG 24 hr tablet Take 0.5 tablets (15 mg total) by mouth daily. 08/08/23   Amin, Ankit C, MD  magnesium  oxide (MAG-OX) 400 MG tablet Take 2 tablets (800 mg total) by mouth daily. 05/07/23   Bensimhon, Rheta Celestine, MD  methocarbamol  (ROBAXIN ) 500 MG tablet Take 500 mg by mouth as needed for muscle spasms.    [provider]  metoCLOPramide  (REGLAN ) 10 MG tablet Take 1 tablet (10 mg total) by mouth every 6 (six) hours. 11/18/23   Prosperi, Christian H, PA-C  omeprazole (PRILOSEC) 20 MG capsule Take 20 mg by mouth daily. 04/11/23   [provider]  SUMAtriptan  (IMITREX ) 50 MG tablet Take 50 mg by mouth daily as needed for headache.    [provider]  UBRELVY  100 MG TABS Take 100 mg by mouth daily as needed (migraine). 07/09/23   [provider]  valACYclovir  (VALTREX ) 500 MG tablet Take 500 mg by mouth daily as needed (cold sores). 04/17/22   [provider]  VELPHORO  500 MG chewable tablet Chew 1,000 mg by mouth 3 (three) times daily with meals. 06/21/23   [provider]  Vitamin D , Ergocalciferol , (DRISDOL ) 1.25 MG (50000 UNIT) CAPS capsule Take 50,000 Units by mouth every Sunday.    [provider]  escitalopram  (LEXAPRO ) 10 MG tablet Take 20 mg by mouth daily.  05/10/20 10/06/20  [provider]  furosemide  (LASIX ) 40 MG tablet Take 1 tablet (40 mg total) by mouth daily. Patient not taking: Reported on 02/16/2021 10/28/19 02/17/21  Varnado, Evelyn, MD   lisinopril  (ZESTRIL ) 10 MG tablet Take 10 mg by mouth daily.  02/23/20 10/06/20  [provider]  spironolactone  (ALDACTONE ) 25 MG tablet Take 25 mg by mouth daily.  04/20/20 10/06/20  [provider]     Vitals:   01/28/24 0600 01/28/24 0630 01/28/24 0826 01/28/24 1230  BP: (!) 153/109 (!) 128/91 (!) 138/93 125/77  Pulse: (!) 101 91  (!) 110  Resp:   18 18  Temp: (!) 97.4 F (36.3 C)  98.3 F (36.8 C) 99.1 F (37.3 C)  TempSrc:   Axillary Oral  SpO2: 97% 98%  97%   Exam Gen lethargic, arouses to voice, answers simple questions No rash, cyanosis or gangrene Sclera anicteric, throat clear  No jvd or bruits Chest clear bilat to bases, no  rales/ wheezing RRR no RG Abd soft ntnd no mass or ascites +bs GU defer MS no joint effusions or deformity Ext no pitting LE or UE edema, no other edema Neuro as above   RIJ TDC intact      Renal-related home meds: Phoslo  2 ac tid Velphoro  1 gm ac tid Coreg  3.125 mg bid Renavite daily Others: prilosec, robaxin , imdur , insulin  lispro, eliquis , statin, elavil    CXR - on 5/31, no active disease   OP HD: TTS SW  4h  B400   59kg  TDC   Heparin  2500 Last OP HD post wt 61.4kg Very good compliance But not getting to dry wt, coming off 2-4kg over Hectorol 7 mcg IV three times per week   Assessment/ Plan: Seizures: appears to be new-onset, neurology consulting. Started on Keppra. EEG pending.  Hyperkalemia: K+ 6.0 this am. Will plan HD off schedule today given high K+.  ESRD: on HD TTS. HD as above.  HTN: bp's are stable, no hypotension or severe HTN. On coreg .  Volume: euvolemic on exam, UF 2-3 L as tolerated.  Anemia of esrd: Hb 12-15 here, follow.  Secondary hyperparathyroidism: CCa in range, phos is high, cont binders when eating.  DM type 1      Rob Jahon Bart  MD [facility] 01/28/2024, 12:53 PM  Recent Labs  Lab 01/27/24 0553 01/27/24 1655 01/28/24 0733  HGB 15.0  --  14.7  ALBUMIN  4.1  --  4.2  CALCIUM  10.1 10.0  9.7  PHOS 8.8*  --   --   CREATININE 6.46* 7.77* 9.93*  K 5.1 4.7 6.0*   Inpatient medications:  amitriptyline   25 mg Oral QHS   apixaban   2.5 mg Oral BID   carvedilol   3.125 mg Oral BID WC   Chlorhexidine  Gluconate Cloth  6 each Topical Daily   insulin  aspart  0-15 Units Subcutaneous Q4H   insulin  glargine-yfgn  12 Units Subcutaneous Daily   [START ON 01/29/2024] levETIRAcetam  250 mg Intravenous Q T,Th,Sat-1800   levETIRAcetam  500 mg Intravenous Q24H   metoCLOPramide  (REGLAN ) injection  10 mg Intravenous Q6H   nitroGLYCERIN  0.5 inch Topical Q6H   pantoprazole  (PROTONIX ) IV  40 mg Intravenous Q24H    acetaminophen , hydrALAZINE , labetalol , LORazepam , melatonin, polyethylene glycol, prochlorperazine 

## 2024-01-28 NOTE — Progress Notes (Signed)
 EEG complete - results pending

## 2024-01-28 NOTE — Procedures (Signed)
 Patient Name: Kathy Lewis  MRN: [id number]  Epilepsy Attending: Arleene Lack  Referring Physician/Provider: Cala Castleman, MD  Date: 01/28/2024 Duration: 22.46 mins  Patient history: 28yo F wiyh seizure like activity. EEG to evaluate for seizure  Level of alertness: Awake, asleep  AEDs during EEG study: LEV  Technical aspects: This EEG study was done with scalp electrodes positioned according to the 10-20 International system of electrode placement. Electrical activity was reviewed with band pass filter of 1-70Hz , sensitivity of 7 uV/mm, display speed of 41mm/sec with a 60Hz  notched filter applied as appropriate. EEG data were recorded continuously and digitally stored.  Video monitoring was available and reviewed as appropriate.  Description: The posterior dominant rhythm consists of 8-9 Hz activity of moderate voltage (25-35 uV) seen predominantly in posterior head regions, symmetric and reactive to eye opening and eye closing. Sleep was characterized by vertex waves, sleep spindles (12 to 14 Hz), maximal frontocentral region. Physiologic photic driving was seen during photic stimulation.  Hyperventilation was not performed.     IMPRESSION: This study is within normal limits. No seizures or epileptiform discharges were seen throughout the recording.  A normal interictal EEG does not exclude the diagnosis of epilepsy.   Kathy Lewis

## 2024-01-28 NOTE — Progress Notes (Signed)
 PROGRESS NOTE                                                                                                                                                                                                             Patient Demographics:    Kathy Lewis, is a 28 y.o. female, DOB - May 26, 1996, ZOX:[id number]  Outpatient Primary MD for the patient is Belva Boyden, PA    LOS - 1  Admit date - 01/26/2024    Chief Complaint  Patient presents with   Emesis    N/V        Brief Narrative (HPI from H&P)   28 y.o. female with medical history significant for ESRD on HD TTS, type 1 diabetes, gastroparesis, postpartum cardiomyopathy, prior CVA, chronic anxiety, who initially presented to the ER with complaints of nausea, vomiting, and abdominal pain.  While in the ER, she had a witnessed generalized seizure by staff that lasted a few minutes.  She was given IV Ativan  with improvement.  However, she had another witnessed seizure like activity in the ER with generalized tonic-clonic movements.  No prior history of seizures.  Started on IV Keppra.  Nephrology consulted to assist with the management.  Noncontrast head CT showed no acute intracranial abnormality.  Admitted by [facility], hospitalist service.    Subjective:    Kathy Lewis today has, No headache, No chest pain, No abdominal pain - No Nausea, No new weakness tingling or numbness, no SOB   Assessment  & Plan :    New onset seizures/pseudoseizures.  Neurology on board, on Keppra, EEG while having jerking movements on 01/27/2024 unremarkable, more such activity on 01/28/2024, CT head unremarkable, MRI noted with some nonspecific changes, neurology on board defer management to neurology.   Intractable nausea and vomiting.  Gastroparesis flareup.History of cyclical vomiting.  CT abdomen pelvis nonacute, on scheduled Reglan  along with PPI, continue antiemetics and monitor. 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    History of DVT.  on Eliquis .   ESRD on HD.  Nephrology consulted.   Hypertensive urgency.  Placed on Coreg .  Monitor.  Mild tachycardia could be due to #1 above, will check baseline TSH and free T4 as well.  Type 1 diabetes mellitus, with hyperglycemia with long-term insulin  use via insulin  pump  with nephropathy and gastroparesis.  Insulin  pump have been removed. Currently on basal bolus regimen + ISS.  Dose adjusted 01/28/2024.  Lab Results  Component Value Date   HGBA1C 8.1 (H) 01/27/2024   CBG (last 3)  Recent Labs    01/28/24 0425 01/28/24 0555 01/28/24 0833  GLUCAP 224* 248* 451*         Condition - Fair  Family Communication  :  None  Code Status :  Full  Consults  :  Renal, Neurology  PUD Prophylaxis :  PPI   Procedures  :     EEG - non acute  MRI - 1. New cortical T2 hyperintensity along the right parieto-occipital junction, suggestive of late subacute to chronic infarct. 2. Unchanged old infarct in the superior aspect of the right cerebellar hemisphere.      Disposition Plan  :    Status is: Inpatient   DVT Prophylaxis  :    apixaban  (ELIQUIS ) tablet 2.5 mg Start: 01/27/24 0415 apixaban  (ELIQUIS ) tablet 2.5 mg     Lab Results  Component Value Date   PLT 215 01/28/2024    Diet :  Diet Order             Diet NPO time specified Except for: Ice Chips  Diet effective midnight                    Inpatient Medications  Scheduled Meds:  amitriptyline   25 mg Oral QHS   apixaban   2.5 mg Oral BID   carvedilol   3.125 mg Oral BID WC   Chlorhexidine  Gluconate Cloth  6 each Topical Daily   insulin  aspart  0-15 Units Subcutaneous Q4H   insulin  glargine-yfgn  12 Units Subcutaneous Daily   [START ON 01/29/2024] levETIRAcetam  250 mg Intravenous Q T,Th,Sat-1800   levETIRAcetam  500 mg Intravenous Q24H   metoCLOPramide  (REGLAN ) injection  10 mg Intravenous Q6H    nitroGLYCERIN  0.5 inch Topical Q6H   pantoprazole  (PROTONIX ) IV  40 mg Intravenous Q24H   Continuous Infusions: PRN Meds:.acetaminophen , hydrALAZINE , labetalol , LORazepam , melatonin, polyethylene glycol, prochlorperazine   Antibiotics  :    Anti-infectives (From admission, onward)    None         Objective:   Vitals:   01/28/24 0400 01/28/24 0600 01/28/24 0630 01/28/24 0826  BP: (!) 133/105 (!) 153/109 (!) 128/91 (!) 138/93  Pulse: 98 (!) 101 91   Resp:    18  Temp: 98.4 F (36.9 C) (!) 97.4 F (36.3 C)  98.3 F (36.8 C)  TempSrc:    Axillary  SpO2: 97% 97% 98%     Wt Readings from Last 3 Encounters:  12/31/23 63.3 kg  12/18/23 68.5 kg  11/18/23 60 kg    No intake or output data in the 24 hours ending 01/28/24 1016   Physical Exam  Awake but mildly somnolent this morning as she received some IV Ativan , gradually waking up, No new F.N deficits,   [place].AT,PERRAL Supple Neck, No JVD,   Symmetrical Chest wall movement, Good air movement bilaterally, CTAB RRR,No Gallops,Rubs or new Murmurs,  +ve B.Sounds, Abd Soft, No tenderness,   No Cyanosis, Clubbing or edema      Data Review:    Recent Labs  Lab 01/26/24 2305 01/26/24 2309 01/27/24 0553 01/28/24 0733  WBC 5.6  --  6.8 8.7  HGB 16.1* 17.3* 15.0 14.7  HCT 47.5* 51.0* 44.5 42.8  PLT 201  --  177 215  MCV 98.3  --  98.5 98.2  MCH 33.3  --  33.2 33.7  MCHC 33.9  --  33.7 34.3  RDW 13.4  --  13.6 13.5  LYMPHSABS 1.3  --   --  0.8  MONOABS 0.4  --   --  0.6  EOSABS 0.1  --   --  0.0  BASOSABS 0.1  --   --  0.0    Recent Labs  Lab 01/26/24 2305 01/26/24 2309 01/27/24 0553 01/27/24 1655 01/28/24 0733  NA 137 136 135 135 133*  K 4.1 4.0 5.1 4.7 6.0*  CL 92* 96* 90* 91* 88*  CO2 26  --  25 22 21*  ANIONGAP 19*  --  20* 22* 25*  GLUCOSE 173* 182* 252* 269* 365*  BUN 19 21* 28* 40* 52*  CREATININE 5.59* 5.60* 6.46* 7.77* 9.93*  AST  --   --   --   --  16  ALT  --   --   --   --  16  ALKPHOS   --   --   --   --  98  BILITOT  --   --   --   --  1.3*  ALBUMIN   --   --  4.1  --  4.2  HGBA1C  --   --  8.1*  --   --   MG  --   --  2.3  --  2.5*  PHOS  --   --  8.8*  --   --   CALCIUM  10.1  --  10.1 10.0 9.7      Recent Labs  Lab 01/26/24 2305 01/27/24 0553 01/27/24 1655 01/28/24 0733  HGBA1C  --  8.1*  --   --   MG  --  2.3  --  2.5*  CALCIUM  10.1 10.1 10.0 9.7    --------------------------------------------------------------------------------------------------------------- No results found for: "CHOL", "HDL", "LDLCALC", "LDLDIRECT", "TRIG", "CHOLHDL"  Lab Results  Component Value Date   HGBA1C 8.1 (H) 01/27/2024   No results for input(s): "TSH", "T4TOTAL", "FREET4", "T3FREE", "THYROIDAB" in the last 72 hours. No results for input(s): "VITAMINB12", "FOLATE", "FERRITIN", "TIBC", "IRON ", "RETICCTPCT" in the last 72 hours. ------------------------------------------------------------------------------------------------------------------ Cardiac Enzymes No results for input(s): "CKMB", "TROPONINI", "MYOGLOBIN" in the last 168 hours.  Invalid input(s): "CK"  Micro Results No results found for this or any previous visit (from the past 240 hours).  Radiology Report CT HEAD WO CONTRAST ( ) Result Date: 01/28/2024 CLINICAL DATA:  Provided history: Mental status change, unknown cause. New onset seizure. Emesis. EXAM: CT HEAD WITHOUT CONTRAST TECHNIQUE: Contiguous axial images were obtained from the base of the skull through the vertex without intravenous contrast. RADIATION DOSE REDUCTION: This exam was performed according to the departmental dose-optimization program which includes automated exposure control, adjustment of the mA and/or kV according to patient size and/or use of iterative reconstruction technique. COMPARISON:  Brain MRI 01/27/2024.  Head CT 6/152025. FINDINGS: Brain: No age-advanced or lobar predominant cerebral atrophy. The suspected late subacute to chronic  right parieto-occipital cortical infarct described on yesterday's brain MRI is occult by CT. Known small chronic infarct within the superior right cerebellar hemisphere. There is no acute intracranial hemorrhage. No extra-axial fluid collection. No evidence of an intracranial mass. No midline shift. Vascular: No hyperdense vessel. Atherosclerotic calcifications. Skull: No calvarial fracture or aggressive osseous lesion. Sinuses/Orbits: No mass or acute finding within the imaged orbits. No significant paranasal sinus disease at the imaged levels. IMPRESSION: 1. The suspected late subacute to chronic right parieto-occipital  cortical infarct described on yesterday's brain MRI is occult by CT. 2. No CT evidence of an interval acute intracranial abnormality. 3. Known small chronic infarct within the superior right cerebellar hemisphere. Electronically Signed   By: Bascom Lily D.O.   On: 01/28/2024 08:11   MR BRAIN WO CONTRAST Result Date: 01/27/2024 EXAM: MRI BRAIN WITHOUT CONTRAST 01/27/2024 04:00:00 PM TECHNIQUE: Multiplanar multisequence MRI of the head/brain was performed without the administration of intravenous contrast. COMPARISON: MRI brain 10/04/2022 and head CT 01/27/2024. CLINICAL HISTORY: Seizure, new-onset, no history of trauma. FINDINGS: BRAIN AND VENTRICLES: New cortical T2 hyperintensity along the right parieto-occipital junction seen on axial images 30 and 32 series 11, associated with subtle T1 hyperintensity and susceptibility artifact, suggestive of late subacute to chronic infarct. Unchanged old infarct in the superior aspect of the right cerebellar hemisphere. No acute hemorrhage. No mass. No midline shift. No hydrocephalus. The sella is unremarkable. Normal flow voids. ORBITS: No acute abnormality. SINUSES AND MASTOIDS: No acute abnormality. BONES AND SOFT TISSUES: Normal marrow signal. No acute soft tissue abnormality. IMPRESSION: 1. New cortical T2 hyperintensity along the right  parieto-occipital junction, suggestive of late subacute to chronic infarct. 2. Unchanged old infarct in the superior aspect of the right cerebellar hemisphere. Electronically signed by: Audra Blend MD 01/27/2024 04:42 PM EDT RP Workstation: [id number]   CT ABDOMEN PELVIS WO CONTRAST Result Date: 01/27/2024 CLINICAL DATA:  Bowel obstruction suspected EXAM: CT ABDOMEN AND PELVIS WITHOUT CONTRAST TECHNIQUE: Multidetector CT imaging of the abdomen and pelvis was performed following the standard protocol without IV contrast. RADIATION DOSE REDUCTION: This exam was performed according to the departmental dose-optimization program which includes automated exposure control, adjustment of the mA and/or kV according to patient size and/or use of iterative reconstruction technique. COMPARISON:  CT abdomen pelvis 07/18/2023 FINDINGS: Lower chest: Central venous catheter with tip terminating within the right atrium. Cardiomegaly. Possible trace hiatal hernia. Hepatobiliary: No focal liver abnormality. No gallstones, gallbladder wall thickening, or pericholecystic fluid. No biliary dilatation. Pancreas: No focal lesion. Normal pancreatic contour. No surrounding inflammatory changes. No main pancreatic ductal dilatation. Spleen: Normal in size without focal abnormality. Adrenals/Urinary Tract: No adrenal nodule bilaterally. No nephrolithiasis and no hydronephrosis. No definite contour-deforming renal mass. No ureterolithiasis or hydroureter. The urinary bladder is unremarkable. Stomach/Bowel: Stomach is within normal limits. No evidence of bowel wall thickening or dilatation. Appendix appears normal. Vascular/Lymphatic: No abdominal aorta or iliac aneurysm. No abdominal, pelvic, or inguinal lymphadenopathy. Reproductive: Uterus and bilateral adnexa are unremarkable. Other: No intraperitoneal free fluid. No intraperitoneal free gas. No organized fluid collection. Musculoskeletal: No abdominal wall hernia or abnormality. No  suspicious lytic or blastic osseous lesions. No acute displaced fracture. IMPRESSION: 1. No acute intra-abdominal or intrapelvic abnormality. 2. Possible trace hiatal hernia. 3. Cardiomegaly. Electronically Signed   By: Morgane  Naveau M.D.   On: 01/27/2024 11:09   EEG adult Result Date: 01/27/2024 Arleene Lack, MD     01/27/2024  4:45 PM Patient Name: Elynore Dolinski MRN: [id number] Epilepsy Attending: Arleene Lack Referring Physician/Provider: Bary Boss, DO Date: 01/27/2024 Duration: 22.24 min Patient history: 28yo F with seizure like activity. EEG to evaluate for seizure. Level of alertness: Awake, asleep AEDs during EEG study: LEV, Ativan  Technical aspects: This EEG study was done with scalp electrodes positioned according to the 10-20 International system of electrode placement. Electrical activity was reviewed with band pass filter of 1-70Hz , sensitivity of 7 uV/mm, display speed of 24mm/sec with a 60Hz  notched filter applied as appropriate. EEG  data were recorded continuously and digitally stored.  Video monitoring was available and reviewed as appropriate. Description: The posterior dominant rhythm consists of 9 Hz activity of moderate voltage (25-35 uV) seen predominantly in posterior head regions, symmetric and reactive to eye opening and eye closing.  Sleep was characterized by vertex waves, sleep spindles (12 to 14 Hz), maximal frontocentral region.  Patient was noted to have episodes of brief sudden whole body jerking during photic stimulation. Concomitant EEG before, during and after the event did not show any EEG changes suggest seizure. Physiologic photic driving was seen during photic stimulation.  Hyperventilation was not performed.   IMPRESSION: This study is within normal limits. No seizures or epileptiform discharges were seen throughout the recording. Patient was noted to have episodes of brief sudden whole body jerking during photic stimulation without concomitant EEG change. These  were NON epileptic events. A normal interictal EEG does not exclude the diagnosis of epilepsy. Priyanka O Yadav   CT Head Wo Contrast Result Date: 01/27/2024 EXAM: CT HEAD WITHOUT 01/27/2024 12:06:28 AM TECHNIQUE: CT of the head was performed without the administration of intravenous contrast. Automated exposure control, iterative reconstruction, and/or weight based adjustment of the mA/kV was utilized to reduce the radiation dose to as low as reasonably achievable. COMPARISON: 11/18/2023 CLINICAL HISTORY: Seizure, new-onset, no history of trauma. Chief complaints; Emesis. FINDINGS: BRAIN AND VENTRICLES: There is no acute intracranial hemorrhage, mass effect or midline shift. No abnormal extra-axial fluid collection. The gray-white differentiation is maintained without evidence of an acute infarct. There is no evidence of hydrocephalus. ORBITS: The visualized portion of the orbits demonstrate no acute abnormality. SINUSES: The visualized paranasal sinuses and mastoid air cells demonstrate no acute abnormality. SOFT TISSUES AND SKULL: No acute abnormality of the visualized skull or soft tissues. IMPRESSION: 1. No acute intracranial abnormality. Electronically signed by: Zadie Herter MD 01/27/2024 12:09 AM EDT RP Workstation: [id number]   DG Chest Portable 1 View Result Date: 01/26/2024 CLINICAL DATA:  Vomiting EXAM: PORTABLE CHEST 1 VIEW COMPARISON:  11/18/2023 FINDINGS: Shallow inspiration. Cardiac enlargement. No vascular congestion, edema, or consolidation. No pleural effusion or pneumothorax. Mediastinal contours appear intact. Right central venous catheter with tip over the cavoatrial junction region. Mediastinal contours appear intact. IMPRESSION: Cardiac enlargement.  No evidence of active pulmonary disease. Electronically Signed   By: Boyce Byes M.D.   On: 01/26/2024 23:54     Signature  -   Lynnwood Sauer M.D on 01/28/2024 at 10:16 AM   -  To page go to [email or web address]

## 2024-01-28 NOTE — Progress Notes (Addendum)
 [facility] night cross cover note:   I was notified by the patient's RN  that the patient had recently pressed her call bell to report that she was experiencing symmetrical shaking in all 4 extremities, but otherwise with acute complaint. During this episode, patient was noted by her RN to be alert and able to answer questions. Episode was not a/w any report of tongue biting or loss of bowel/bladder function. Shaking resolved without intervention and patient remains at her baseline mental status, without acute complaint at this time.   VS remain stable, including AF, mild tachycardia to low 100s, consistent with heart rate on dayshift; systolic blood pressures in the 130s to 140s mmHg, and O2 98% on 1 L [place], which is unchanged from earlier during this shift.   ABG earlier this evening showed no e/o of significant hypercapnia.   The above episode appears less suggestive of either a generalize or focal seizure. In the setting of stable VS, with patient at baseline mental status, and without acute complaint at this time, will continue monitor for now.    Update: similar episode to the above again this AM involving shaking in all 4 extremities while pt is awake, alert and, conversant. Dose of prn ativan  from existing order administered at this time.     Camelia Cavalier, DO Hospitalist

## 2024-01-28 NOTE — Progress Notes (Signed)
 NEUROLOGY CONSULT FOLLOW UP NOTE   Date of service: January 28, 2024 Patient Name: Kathy Lewis MRN:  [id number] DOB:  1995/11/21  Interval Hx/subjective   Patient lying in bed. She has had multiple witnessed episodes of full-body shaking where she is able to converse during. One episode was recorded on EEG with no concomitant changes seen.  Repeat CTH this AM negative. Routine EEG this AM continues to be negative.   Vitals   Vitals:   01/28/24 1623 01/28/24 1641 01/28/24 1700 01/28/24 1701  BP: 92/73 100/64 96/70 (!) 101/58  Pulse: 91 91 91 91  Resp: 13 12 13 12   Temp:      TempSrc:      SpO2: 99% 98% 100% 100%     There is no height or weight on file to calculate BMI.  Physical Exam   Constitutional: Appears well-developed and well-nourished.  Cardiovascular: Normal rate and regular rhythm.  Respiratory: Effort normal, non-labored breathing.   Neurologic Examination   Neuro: Mental Status: Patient is lethargic but oriented, follows commands. Intermittent slow/paused responses.  No signs of dysarthria, aphasia or neglect Cranial Nerves: II: Visual Fields are full. Pupils are equal, round, and reactive to light.   III,IV, VI: EOMI without ptosis or diploplia.  V: Facial sensation is symmetric to temperature VII: Facial movement is symmetric.  VIII: hearing is intact to voice X: No dysarthria XI: Shoulder shrug is symmetric. XII: tongue is midline without atrophy or fasciculations.  Motor: Tone is normal. Bulk is normal.  Generalized weakness, 4+/5 throughout Sensory: Sensation is symmetric to light touch in the arms and legs. Cerebellar: FNF weak/slow, but intact bilaterally   Medications  Current Facility-Administered Medications:    acetaminophen  (TYLENOL ) tablet 650 mg, 650 mg, Oral, Q6H PRN, Bary Boss, DO, 650 mg at 01/27/24 2131   alteplase  (CATHFLO ACTIVASE ) injection 2 mg, 2 mg, Intracatheter, Once PRN, Chucky Craver, MD   amitriptyline  (ELAVIL )  tablet 25 mg, 25 mg, Oral, QHS, Patel, Pranav M, MD, 25 mg at 01/27/24 2132   apixaban  (ELIQUIS ) tablet 2.5 mg, 2.5 mg, Oral, BID, Hall, Carole N, DO, 2.5 mg at 01/27/24 2132   carvedilol  (COREG ) tablet 3.125 mg, 3.125 mg, Oral, BID WC, Singh, Prashant K, MD   Chlorhexidine  Gluconate Cloth 2 % PADS 6 each, 6 each, Topical, Daily, Patel, Pranav M, MD, 6 each at 01/28/24 1055   [START ON 01/29/2024] Chlorhexidine  Gluconate Cloth 2 % PADS 6 each, 6 each, Topical, Q0600, Chucky Craver, MD   feeding supplement (NEPRO CARB STEADY) liquid 237 mL, 237 mL, Oral, PRN, Chucky Craver, MD   heparin  injection 1,000 Units, 1,000 Units, Intracatheter, PRN, Chucky Craver, MD   [START ON 01/29/2024] heparin  injection 2,500 Units, 2,500 Units, Dialysis, Once in dialysis, Cala Castleman, MD   hydrALAZINE  (APRESOLINE ) injection 10 mg, 10 mg, Intravenous, Q6H PRN, Singh, Prashant K, MD   insulin  aspart (novoLOG ) injection 0-15 Units, 0-15 Units, Subcutaneous, Q4H, Patel, Pranav M, MD, 10 Units at 01/28/24 1139   insulin  glargine-yfgn (SEMGLEE ) injection 12 Units, 12 Units, Subcutaneous, Daily, Singh, Prashant K, MD, 12 Units at 01/28/24 1140   labetalol  (NORMODYNE ) injection 10 mg, 10 mg, Intravenous, Q2H PRN, Patel, Pranav M, MD, 10 mg at 01/28/24 0605   [START ON 01/29/2024] levETIRAcetam (KEPPRA) undiluted injection 250 mg, 250 mg, Intravenous, Q T,Th,Sat-1800, Lindzen, Eric, MD   levETIRAcetam (KEPPRA) undiluted injection 500 mg, 500 mg, Intravenous, Q24H, Lindzen, Eric, MD, 500 mg at 01/28/24 1054   LORazepam  (ATIVAN ) injection 2  mg, 2 mg, Intravenous, Q6H PRN, Bary Boss, DO, 2 mg at 01/28/24 0633   melatonin tablet 5 mg, 5 mg, Oral, QHS PRN, Del Favia, Carole N, DO   metoCLOPramide  (REGLAN ) injection 10 mg, 10 mg, Intravenous, Q6H, Patel, Pranav M, MD, 10 mg at 01/28/24 1338   nitroGLYCERIN (NITROGLYN) 2 % ointment 0.5 inch, 0.5 inch, Topical, Q6H, Kraig Peru, MD, 0.5 inch at 01/28/24 1338   pantoprazole   (PROTONIX ) injection 40 mg, 40 mg, Intravenous, Q24H, Patel, Pranav M, MD, 40 mg at 01/28/24 1054   polyethylene glycol (MIRALAX  / GLYCOLAX ) packet 17 g, 17 g, Oral, Daily PRN, Reesa Cannon N, DO   prochlorperazine  (COMPAZINE ) injection 10 mg, 10 mg, Intravenous, Q6H PRN, Kraig Peru, MD, 10 mg at 01/27/24 1142  Labs and Diagnostic Imaging   CBC:  Recent Labs  Lab 01/26/24 2305 01/26/24 2309 01/27/24 0553 01/28/24 0733  WBC 5.6  --  6.8 8.7  NEUTROABS 3.7  --   --  7.2  HGB 16.1*   < > 15.0 14.7  HCT 47.5*   < > 44.5 42.8  MCV 98.3  --  98.5 98.2  PLT 201  --  177 215   < > = values in this interval not displayed.    Basic Metabolic Panel:  Lab Results  Component Value Date   NA 133 (L) 01/28/2024   K 6.0 (H) 01/28/2024   CO2 21 (L) 01/28/2024   GLUCOSE 365 (H) 01/28/2024   BUN 52 (H) 01/28/2024   CREATININE 9.93 (H) 01/28/2024   CALCIUM  9.7 01/28/2024   GFRNONAA 5 (L) 01/28/2024   GFRAA 52 (L) 10/28/2019   Lipid Panel: No results found for: "LDLCALC" HgbA1c:  Lab Results  Component Value Date   HGBA1C 8.1 (H) 01/27/2024   Urine Drug Screen: No results found for: "LABOPIA", "COCAINSCRNUR", "LABBENZ", "AMPHETMU", "THCU", "LABBARB"  Alcohol Level     Component Value Date/Time   ETH <10 05/26/2023 1337   INR  Lab Results  Component Value Date   INR 1.0 10/22/2019   APTT  Lab Results  Component Value Date   APTT 28 10/22/2019   AED levels: No results found for: "PHENYTOIN", "ZONISAMIDE", "LAMOTRIGINE", "LEVETIRACETA"  CT Head without contrast(Personally reviewed): No acute intracranial abnormality.   Repeat CT Head 6/2:  The suspected late subacute to chronic right parieto-occipital cortical infarct described on yesterday's brain MRI is occult by CT. No CT evidence of an interval acute intracranial abnormality. Known small chronic infarct within the superior right cerebellar hemisphere.  MRI Brain(Personally reviewed): New cortical T2 hyperintensity  along the right parieto-occipital junction, suggestive of late subacute to chronic infarct.  Neurodiagnostics rEEG 6/1:  Within normal limits. No seizures or epileptiform discharges were seen throughout the recording. Patient was noted to have episodes of brief sudden whole body jerking during photic stimulation without concomitant EEG change. These were NON epileptic events.  rEEG 6/2: Within normal limits. No seizures or epileptiform discharges were seen throughout the recording   ASSESSMENT   Kathy Lewis is a 28 y.o. female with a history of CVA, DM1, ESRD on HD who presents with new onset seizures. One seizure occurred at home and two were witnessed in the ED, with postictal state. CTH and MRI Brain negative for acute process, shows late subacute to chronic infarct. Glucose continues to be elevated this AM, Cr chronically elevated with ESRD diagnosis (HD T,TH,Sa) but has increased this AM. Hyperkalemia has worsened this morning after improving the past couple of  days.  Patient takes Klonopin  and Atarax  at home for anxiety, these have not been continued inpatient. Could restart Klonopin  at half dose if withdrawal is a concern.   One episode has been captured with no concomitant EEG change. Ativan  was given this morning. Patient is lethargic versus post-ictal this am but able to talk to me and is oriented.   Impression: Pseudoseizures versus New-onset seizures in patient on dialysis with multiple electrolyte derangements  Recommendations   - cEEG for further spell capture - continue seizure precautions - continue Keppra 500mg  Q24H - Electrolyte mgmt per Primary and Nephrolgy   - Ativan  PRN for seizures lasting longer than 2 minutes, please call neurology if given.  ______________________________________________________________________   Cherl Corner, NP [facility]    Attending Neurohospitalist Addendum Patient seen and examined with APP/Resident. Agree  with the history and physical as documented above. Agree with the plan as documented, which I helped formulate. I have edited the note above to reflect my full findings and recommendations. I have independently reviewed the chart, obtained history, review of systems and examined the patient.I have personally reviewed pertinent head/neck/spine imaging (CT/MRI). Please feel free to call with any questions.  -- Greg Leaks, MD [facility] [phone]  If 7pm- 7am, please page neurology on call as listed in AMION.

## 2024-01-28 NOTE — Inpatient Diabetes Management (Signed)
 Inpatient Diabetes Program Recommendations  AACE/ADA: New Consensus Statement on Inpatient Glycemic Control (2015)  Target Ranges:  Prepandial:   less than 140 mg/dL      Peak postprandial:   less than 180 mg/dL (1-2 hours)      Critically ill patients:  140 - 180 mg/dL   Lab Results  Component Value Date   GLUCAP 118 (H) 01/28/2024   HGBA1C 8.1 (H) 01/27/2024    Review of Glycemic Control  Diabetes history: DM1 Outpatient Diabetes medications: OmniPod insulin  pump with Dexcom CGM; insulin  pump settings (0.5 units/hour (total basal 12 units per day), 1 unit per 8 grams carbs, 1 unit drops glucose 50 mg/dl) per settings on 16/10/96  Current orders for Inpatient glycemic control: Semglee  12 daily, Novolog  0-15 Q4H  HgbA1C - 8.1% NPO except for ice chips  Inpatient Diabetes Program Recommendations:    Consider:  Decrease Novolog  to 0-9 TID Q4H  Add Novolog  2 units when pt taking po supplement.  Will see pt when appropriate.   Thank you. Joni Net, RD, LDN, CDCES Inpatient Diabetes Coordinator [phone]

## 2024-01-28 NOTE — Progress Notes (Signed)
 SLP Cancellation Note  Patient Details Name: Kathy Lewis MRN: [id number] DOB: 1996/07/24   Cancelled treatment:       Reason Eval/Treat Not Completed: Patient at procedure or test/unavailable (HD). SLP will f/u as schedule permits.    Amil Kale, M.A., CCC-SLP Speech Language Pathology, Acute Rehabilitation Services  Secure Chat preferred [phone]  01/28/2024, 2:50 PM

## 2024-01-28 NOTE — Progress Notes (Signed)
 [facility] night cross cover note:   I was notified by the patient's RN of the patient's CBG result this evening of 38, prompting administration of an amp of D50.  Her CBG has subsequently improved to 152.  She was awake and alert throughout the hypoglycemic episode.  She has noted to be currently n.p.o., on every 4 hour Accu-Cheks with sliding scale insulin .  Her current sliding scale would indicate that she is to receive a dose of sliding scale insulin  for CBG result greater than 120.  Given the above hypoglycemic episode, we will hold this next dose of sliding scale insulin , with further trending of blood sugar via existing order for every 4 hours CBG monitoring.    Camelia Cavalier, DO Hospitalist

## 2024-01-29 ENCOUNTER — Inpatient Hospital Stay (HOSPITAL_COMMUNITY)

## 2024-01-29 DIAGNOSIS — E1022 Type 1 diabetes mellitus with diabetic chronic kidney disease: Secondary | ICD-10-CM | POA: Diagnosis not present

## 2024-01-29 DIAGNOSIS — N186 End stage renal disease: Secondary | ICD-10-CM | POA: Diagnosis not present

## 2024-01-29 DIAGNOSIS — E878 Other disorders of electrolyte and fluid balance, not elsewhere classified: Secondary | ICD-10-CM | POA: Diagnosis not present

## 2024-01-29 DIAGNOSIS — R569 Unspecified convulsions: Secondary | ICD-10-CM | POA: Diagnosis not present

## 2024-01-29 LAB — CBC WITH DIFFERENTIAL/PLATELET
Abs Immature Granulocytes: 0.03 10*3/uL (ref 0.00–0.07)
Basophils Absolute: 0.1 10*3/uL (ref 0.0–0.1)
Basophils Relative: 0 %
Eosinophils Absolute: 0 10*3/uL (ref 0.0–0.5)
Eosinophils Relative: 0 %
HCT: 45 % (ref 36.0–46.0)
Hemoglobin: 15.2 g/dL — ABNORMAL HIGH (ref 12.0–15.0)
Immature Granulocytes: 0 %
Lymphocytes Relative: 11 %
Lymphs Abs: 1.2 10*3/uL (ref 0.7–4.0)
MCH: 33.6 pg (ref 26.0–34.0)
MCHC: 33.8 g/dL (ref 30.0–36.0)
MCV: 99.6 fL (ref 80.0–100.0)
Monocytes Absolute: 0.8 10*3/uL (ref 0.1–1.0)
Monocytes Relative: 7 %
Neutro Abs: 9.1 10*3/uL — ABNORMAL HIGH (ref 1.7–7.7)
Neutrophils Relative %: 82 %
Platelets: 229 10*3/uL (ref 150–400)
RBC: 4.52 MIL/uL (ref 3.87–5.11)
RDW: 13.7 % (ref 11.5–15.5)
WBC: 11.2 10*3/uL — ABNORMAL HIGH (ref 4.0–10.5)
nRBC: 0 % (ref 0.0–0.2)

## 2024-01-29 LAB — COMPREHENSIVE METABOLIC PANEL WITH GFR
ALT: 14 U/L (ref 0–44)
AST: 18 U/L (ref 15–41)
Albumin: 4.1 g/dL (ref 3.5–5.0)
Alkaline Phosphatase: 98 U/L (ref 38–126)
Anion gap: 16 — ABNORMAL HIGH (ref 5–15)
BUN: 30 mg/dL — ABNORMAL HIGH (ref 6–20)
CO2: 27 mmol/L (ref 22–32)
Calcium: 9.6 mg/dL (ref 8.9–10.3)
Chloride: 91 mmol/L — ABNORMAL LOW (ref 98–111)
Creatinine, Ser: 6.64 mg/dL — ABNORMAL HIGH (ref 0.44–1.00)
GFR, Estimated: 8 mL/min — ABNORMAL LOW (ref >60)
Glucose, Bld: 129 mg/dL — ABNORMAL HIGH (ref 70–99)
Potassium: 4.3 mmol/L (ref 3.5–5.1)
Sodium: 134 mmol/L — ABNORMAL LOW (ref 135–145)
Total Bilirubin: 1.2 mg/dL (ref 0.0–1.2)
Total Protein: 8.2 g/dL — ABNORMAL HIGH (ref 6.5–8.1)

## 2024-01-29 LAB — GLUCOSE, CAPILLARY
Glucose-Capillary: 120 mg/dL — ABNORMAL HIGH (ref 70–99)
Glucose-Capillary: 137 mg/dL — ABNORMAL HIGH (ref 70–99)
Glucose-Capillary: 139 mg/dL — ABNORMAL HIGH (ref 70–99)
Glucose-Capillary: 143 mg/dL — ABNORMAL HIGH (ref 70–99)
Glucose-Capillary: 156 mg/dL — ABNORMAL HIGH (ref 70–99)
Glucose-Capillary: 202 mg/dL — ABNORMAL HIGH (ref 70–99)
Glucose-Capillary: 259 mg/dL — ABNORMAL HIGH (ref 70–99)
Glucose-Capillary: 98 mg/dL (ref 70–99)

## 2024-01-29 LAB — MAGNESIUM: Magnesium: 2.3 mg/dL (ref 1.7–2.4)

## 2024-01-29 LAB — PHOSPHORUS: Phosphorus: 10.1 mg/dL — ABNORMAL HIGH (ref 2.5–4.6)

## 2024-01-29 LAB — HEPATITIS B SURFACE ANTIBODY, QUANTITATIVE: Hep B S AB Quant (Post): 505 m[IU]/mL

## 2024-01-29 MED ORDER — INSULIN PUMP
Freq: Three times a day (TID) | SUBCUTANEOUS | Status: DC
  Filled 2024-01-29: qty 1

## 2024-01-29 MED ORDER — IOHEXOL 350 MG/ML SOLN
75.0000 mL | Freq: Once | INTRAVENOUS | Status: AC | PRN
  Administered 2024-01-29: 75 mL via INTRAVENOUS

## 2024-01-29 MED ORDER — INSULIN ASPART 100 UNIT/ML IJ SOLN
0.0000 [IU] | Freq: Three times a day (TID) | INTRAMUSCULAR | Status: DC
  Administered 2024-01-30: 3 [IU] via SUBCUTANEOUS
  Administered 2024-01-31: 11 [IU] via SUBCUTANEOUS
  Administered 2024-01-31: 3 [IU] via SUBCUTANEOUS

## 2024-01-29 MED ORDER — INSULIN GLARGINE-YFGN 100 UNIT/ML ~~LOC~~ SOLN
10.0000 [IU] | Freq: Every day | SUBCUTANEOUS | Status: DC
  Administered 2024-01-30 – 2024-01-31 (×2): 10 [IU] via SUBCUTANEOUS
  Filled 2024-01-29 (×2): qty 0.1

## 2024-01-29 MED ORDER — CHLORHEXIDINE GLUCONATE CLOTH 2 % EX PADS
6.0000 | MEDICATED_PAD | Freq: Every day | CUTANEOUS | Status: DC
  Administered 2024-01-30 – 2024-01-31 (×2): 6 via TOPICAL

## 2024-01-29 MED ORDER — INSULIN GLARGINE-YFGN 100 UNIT/ML ~~LOC~~ SOLN
10.0000 [IU] | Freq: Every day | SUBCUTANEOUS | Status: DC
  Administered 2024-01-29: 10 [IU] via SUBCUTANEOUS
  Filled 2024-01-29: qty 0.1

## 2024-01-29 MED ORDER — INSULIN ASPART 100 UNIT/ML IJ SOLN
0.0000 [IU] | Freq: Four times a day (QID) | INTRAMUSCULAR | Status: DC
  Administered 2024-01-29: 2 [IU] via SUBCUTANEOUS

## 2024-01-29 NOTE — Plan of Care (Signed)
  Problem: Health Behavior/Discharge Planning: Goal: Ability to manage health-related needs will improve Outcome: Progressing   Problem: Metabolic: Goal: Ability to maintain appropriate glucose levels will improve Outcome: Progressing   Problem: Skin Integrity: Goal: Risk for impaired skin integrity will decrease Outcome: Progressing   Problem: Tissue Perfusion: Goal: Adequacy of tissue perfusion will improve Outcome: Progressing   Problem: Clinical Measurements: Goal: Ability to maintain clinical measurements within normal limits will improve Outcome: Progressing Goal: Will remain free from infection Outcome: Progressing Goal: Diagnostic test results will improve Outcome: Progressing Goal: Respiratory complications will improve Outcome: Progressing Goal: Cardiovascular complication will be avoided Outcome: Progressing   Problem: Coping: Goal: Level of anxiety will decrease Outcome: Progressing   Problem: Elimination: Goal: Will not experience complications related to bowel motility Outcome: Progressing Goal: Will not experience complications related to urinary retention Outcome: Progressing   Problem: Pain Managment: Goal: General experience of comfort will improve and/or be controlled Outcome: Progressing   Problem: Safety: Goal: Ability to remain free from injury will improve Outcome: Progressing   Problem: Skin Integrity: Goal: Risk for impaired skin integrity will decrease Outcome: Progressing

## 2024-01-29 NOTE — Inpatient Diabetes Management (Signed)
 Inpatient Diabetes Program Recommendations  AACE/ADA: New Consensus Statement on Inpatient Glycemic Control (2015)  Target Ranges:  Prepandial:   less than 140 mg/dL      Peak postprandial:   less than 180 mg/dL (1-2 hours)      Critically ill patients:  140 - 180 mg/dL   Lab Results  Component Value Date   GLUCAP 120 (H) 01/29/2024   HGBA1C 8.1 (H) 01/27/2024    Review of Glycemic Control  Diabetes history: DM1 Outpatient Diabetes medications:  OmniPod insulin  pump with Dexcom CGM; insulin  pump settings (0.5 units/hour (total basal 12 units per day), 1 unit per 8 grams carbs, 1 unit drops glucose 50 mg/dl) per settings  Current orders for Inpatient glycemic control: Semglee  10 units every day, Novolog  0-15 units TID  Spoke with family member at bedside; patient is sleeping.  Her insulin  pump was removed in the ED and is currently at home.  Patient is very familiar to the diabetes team.  Will follow while inpatient.  Thank you, Hays Lipschutz, MSN, CDCES Diabetes Coordinator Inpatient Diabetes Program [phone] (team pager from 8a-5p)

## 2024-01-29 NOTE — Evaluation (Signed)
 Clinical/Bedside Swallow Evaluation Patient Details  Name: Enid Maultsby MRN: [id number] Date of Birth: Mar 04, 1996  Today's Date: 01/29/2024 Time: SLP Start Time (ACUTE ONLY): 9147 SLP Stop Time (ACUTE ONLY): 0946 SLP Time Calculation (min) (ACUTE ONLY): 10 min  Past Medical History:  Past Medical History:  Diagnosis Date   Anemia    Anxiety    Bipolar 2 disorder (HCC)    Chronic systolic (congestive) heart failure (HCC)    Depression    DKA (diabetic ketoacidoses)    ESRD on hemodialysis (HCC)    TTS at [facility]   HSV infection    on valtrex    Hypokalemia    Leukocytosis    Migraine    Noncompliance with medication regimen    Preeclampsia    Prolonged QT syndrome    Severe anemia    Type 1 diabetes mellitus (HCC)    Past Surgical History:  Past Surgical History:  Procedure Laterality Date   BIOPSY  04/24/2022   Procedure: BIOPSY;  Surgeon: Elois Hair, MD;  Location: [facility] ENDOSCOPY;  Service: Gastroenterology;;   CARDIAC CATHETERIZATION     COLONOSCOPY WITH PROPOFOL  N/A 04/24/2022   Procedure: COLONOSCOPY WITH PROPOFOL ;  Surgeon: Elois Hair, MD;  Location: [facility] ENDOSCOPY;  Service: Gastroenterology;  Laterality: N/A;   DILATION AND EVACUATION N/A 10/22/2019   Procedure: ULTRASOUND GUIDED DILATATION AND EVACUATION;  Surgeon: Johnn Najjar, MD;  Location: [facility] LD ORS;  Service: Gynecology;  Laterality: N/A;   ESOPHAGOGASTRODUODENOSCOPY (EGD) WITH PROPOFOL  N/A 04/24/2022   Procedure: ESOPHAGOGASTRODUODENOSCOPY (EGD) WITH PROPOFOL ;  Surgeon: Elois Hair, MD;  Location: [facility] ENDOSCOPY;  Service: Gastroenterology;  Laterality: N/A;   IR FLUORO GUIDE CV LINE RIGHT  09/14/2022   IR US  GUIDE VASC ACCESS RIGHT  09/14/2022   peritoneal dialysis catheter insertion     RENAL BIOPSY     HPI:  Ajwa Kimberley is a 28 yo female presenting to ED 5/31 with nausea, vomiting, and abdominal pain. In the ED, pt had a witnessed generalized seizure with postictal state. MRI  shows acute cortical T2 hyperintensity along the R parieto-occipital junction, suggestive of late subacute to chronic infarct, and an unchanged chronic infarct. PMH includes prior CVA, T1DM, ESRD on HD    Assessment / Plan / Recommendation  Clinical Impression  Pt endorses persistent nausea with recent episode of emesis. Assessed pt with water  only as she declined purees and solids. There were no signs clinically concerning for aspiration with sequential straw sips of thin liquids. Discussed with MD and will leave current diet in place, although suspect n/v may impact intake. SLP will f/u at least briefly for further assessment but do not anticipate post-acute needs at this time. SLP Visit Diagnosis: Dysphagia, unspecified (R13.10)    Aspiration Risk  Mild aspiration risk    Diet Recommendation Regular;Thin liquid    Liquid Administration via: Cup;Straw Medication Administration: Whole meds with liquid Supervision: Patient able to self feed Compensations: Slow rate;Small sips/bites Postural Changes: Seated upright at 90 degrees    Other  Recommendations Oral Care Recommendations: Oral care BID    Recommendations for follow up therapy are one component of a multi-disciplinary discharge planning process, led by the attending physician.  Recommendations may be updated based on patient status, additional functional criteria and insurance authorization.  Follow up Recommendations No SLP follow up      Assistance Recommended at Discharge    Functional Status Assessment Patient has had a recent decline in their functional status and demonstrates the ability to  make significant improvements in function in a reasonable and predictable amount of time.  Frequency and Duration min 2x/week  1 week       Prognosis Prognosis for improved oropharyngeal function: Good      Swallow Study   General HPI: Baneen Wieseler is a 28 yo female presenting to ED 5/31 with nausea, vomiting, and abdominal  pain. In the ED, pt had a witnessed generalized seizure with postictal state. MRI shows acute cortical T2 hyperintensity along the R parieto-occipital junction, suggestive of late subacute to chronic infarct, and an unchanged chronic infarct. PMH includes prior CVA, T1DM, ESRD on HD Type of Study: Bedside Swallow Evaluation Previous Swallow Assessment: none in chart Diet Prior to this Study: Regular;Thin liquids (Level 0) Temperature Spikes Noted: No Respiratory Status: Room air History of Recent Intubation: No Behavior/Cognition: Alert;Cooperative Oral Cavity Assessment: Within Functional Limits Oral Care Completed by SLP: No Oral Cavity - Dentition: Adequate natural dentition Vision: Functional for self-feeding Self-Feeding Abilities: Able to feed self Patient Positioning: Upright in bed Baseline Vocal Quality: Normal Volitional Cough: Strong Volitional Swallow: Able to elicit    Oral/Motor/Sensory Function Overall Oral Motor/Sensory Function: Within functional limits   Ice Chips Ice chips: Not tested   Thin Liquid Thin Liquid: Within functional limits Presentation: Straw    Nectar Thick Nectar Thick Liquid: Not tested   Honey Thick Honey Thick Liquid: Not tested   Puree Puree: Not tested   Solid     Solid: Not tested      Amil Kale, M.A., CCC-SLP Speech Language Pathology, Acute Rehabilitation Services  Secure Chat preferred [phone]  01/29/2024,10:56 AM

## 2024-01-29 NOTE — Progress Notes (Addendum)
 NEUROLOGY CONSULT FOLLOW UP NOTE   Date of service: January 29, 2024 Patient Name: Kathy Lewis MRN:  [id number] DOB:  [birth year]  Interval Hx/subjective   Patient lying in bed, more awake than on yesterday's exam. Denies headache. States that she feels "closer to normal". Placed on LTM EEG for spel capture last night, negative read this AM with no episodes reported.   Vitals   Vitals:   01/29/24 0200 01/29/24 0400 01/29/24 0500 01/29/24 0745  BP: 118/84 (!) 142/101 114/82 (!) 127/98  Pulse: (!) 107 (!) 111 98 98  Resp:  20 16 19   Temp:  98.1 F (36.7 C)  98.3 F (36.8 C)  TempSrc:    Oral  SpO2: 98% 97% 96% 98%     There is no height or weight on file to calculate BMI.  Physical Exam   Constitutional: Appears well-developed and well-nourished.  Cardiovascular: Normal rate and regular rhythm.  Respiratory: Effort normal, non-labored breathing.   Neurologic Examination   Neuro: Mental Status: Patient is drowsy, but easily awakens and oriented to time, place, situation.  No signs of dysarthria, aphasia or neglect Cranial Nerves: II: Visual Fields are full. Pupils are equal, round, and reactive to light.   III,IV, VI: EOMI without ptosis or diploplia.  V: Facial sensation is symmetric to temperature VII: Facial movement is symmetric.  VIII: hearing is intact to voice X: No dysarthria XI: Shoulder shrug is symmetric. XII: tongue is midline without atrophy or fasciculations.  Motor: Tone is normal. Bulk is normal.  Improved generalized strength, 5/5 with no focal deficits.  Sensory: Sensation is symmetric to light touch in the arms and legs. Cerebellar: FNF intact bilaterally  Medications  Current Facility-Administered Medications:    acetaminophen  (TYLENOL ) tablet 650 mg, 650 mg, Oral, Q6H PRN, Bary Boss, DO, 650 mg at 01/27/24 2131   amitriptyline  (ELAVIL ) tablet 25 mg, 25 mg, Oral, QHS, Patel, Pranav M, MD, 25 mg at 01/27/24 2132   apixaban  (ELIQUIS ) tablet  2.5 mg, 2.5 mg, Oral, BID, Hall, Carole N, DO, 2.5 mg at 01/27/24 2132   carvedilol  (COREG ) tablet 3.125 mg, 3.125 mg, Oral, BID WC, Singh, Prashant K, MD   Chlorhexidine  Gluconate Cloth 2 % PADS 6 each, 6 each, Topical, Daily, Patel, Pranav M, MD, 6 each at 01/28/24 1055   Chlorhexidine  Gluconate Cloth 2 % PADS 6 each, 6 each, Topical, Q0600, Chucky Craver, MD, 6 each at 01/29/24 0636   fentaNYL  (SUBLIMAZE ) injection 25 mcg, 25 mcg, Intravenous, Q2H PRN, Howerter, Justin B, DO, 25 mcg at 01/29/24 0006   hydrALAZINE  (APRESOLINE ) injection 10 mg, 10 mg, Intravenous, Q6H PRN, Singh, Prashant K, MD, 10 mg at 01/29/24 0022   insulin  aspart (novoLOG ) injection 0-15 Units, 0-15 Units, Subcutaneous, Q6H, Zelda Hickman, Prashant K, MD   insulin  glargine-yfgn (SEMGLEE ) injection 10 Units, 10 Units, Subcutaneous, Daily, Zelda Hickman, Prashant K, MD   labetalol  (NORMODYNE ) injection 10 mg, 10 mg, Intravenous, Q2H PRN, Patel, Pranav M, MD, 10 mg at 01/29/24 0451   levETIRAcetam (KEPPRA) undiluted injection 250 mg, 250 mg, Intravenous, Q T,Th,Sat-1800, Lindzen, Eric, MD   levETIRAcetam (KEPPRA) undiluted injection 500 mg, 500 mg, Intravenous, Q24H, Lindzen, Eric, MD, 500 mg at 01/28/24 1054   LORazepam  (ATIVAN ) injection 0.5 mg, 0.5 mg, Intravenous, Once, Howerter, Justin B, DO   LORazepam  (ATIVAN ) injection 2 mg, 2 mg, Intravenous, Q6H PRN, Reesa Cannon N, DO, 2 mg at 01/28/24 0633   melatonin tablet 5 mg, 5 mg, Oral, QHS PRN, Bary Boss, DO  metoCLOPramide  (REGLAN ) injection 10 mg, 10 mg, Intravenous, Q6H, Patel, Pranav M, MD, 10 mg at 01/29/24 0636   naloxone  (NARCAN ) injection 0.4 mg, 0.4 mg, Intravenous, PRN, Howerter, Justin B, DO   nitroGLYCERIN (NITROGLYN) 2 % ointment 0.5 inch, 0.5 inch, Topical, Q6H, Kraig Peru, MD, 0.5 inch at 01/29/24 0636   pantoprazole  (PROTONIX ) injection 40 mg, 40 mg, Intravenous, Q24H, Patel, Pranav M, MD, 40 mg at 01/28/24 1054   polyethylene glycol (MIRALAX  / GLYCOLAX ) packet 17  g, 17 g, Oral, Daily PRN, Reesa Cannon N, DO   prochlorperazine  (COMPAZINE ) injection 10 mg, 10 mg, Intravenous, Q6H PRN, Kraig Peru, MD, 10 mg at 01/29/24 0500  Labs and Diagnostic Imaging   CBC:  Recent Labs  Lab 01/26/24 2305 01/26/24 2309 01/27/24 0553 01/28/24 0733  WBC 5.6  --  6.8 8.7  NEUTROABS 3.7  --   --  7.2  HGB 16.1*   < > 15.0 14.7  HCT 47.5*   < > 44.5 42.8  MCV 98.3  --  98.5 98.2  PLT 201  --  177 215   < > = values in this interval not displayed.    Basic Metabolic Panel:  Lab Results  Component Value Date   NA 133 (L) 01/28/2024   K 6.0 (H) 01/28/2024   CO2 21 (L) 01/28/2024   GLUCOSE 365 (H) 01/28/2024   BUN 52 (H) 01/28/2024   CREATININE 9.93 (H) 01/28/2024   CALCIUM  9.7 01/28/2024   GFRNONAA 5 (L) 01/28/2024   GFRAA 52 (L) 10/28/2019   Lipid Panel: No results found for: "LDLCALC" HgbA1c:  Lab Results  Component Value Date   HGBA1C 8.1 (H) 01/27/2024   Urine Drug Screen: No results found for: "LABOPIA", "COCAINSCRNUR", "LABBENZ", "AMPHETMU", "THCU", "LABBARB"  Alcohol Level     Component Value Date/Time   ETH <10 05/26/2023 1337   INR  Lab Results  Component Value Date   INR 1.0 10/22/2019   APTT  Lab Results  Component Value Date   APTT 28 10/22/2019   AED levels: No results found for: "PHENYTOIN", "ZONISAMIDE", "LAMOTRIGINE", "LEVETIRACETA"  CT Head without contrast(Personally reviewed): No acute intracranial abnormality.   Repeat CT Head 6/2:  The suspected late subacute to chronic right parieto-occipital cortical infarct described on yesterday's brain MRI is occult by CT. No CT evidence of an interval acute intracranial abnormality. Known small chronic infarct within the superior right cerebellar hemisphere.  MRI Brain(Personally reviewed): New cortical T2 hyperintensity along the right parieto-occipital junction, suggestive of late subacute to chronic infarct.  Neurodiagnostics rEEG 6/1:  Within normal limits. No  seizures or epileptiform discharges were seen throughout the recording. Patient was noted to have episodes of brief sudden whole body jerking during photic stimulation without concomitant EEG change. These were NON epileptic events.  rEEG 6/2: Within normal limits. No seizures or epileptiform discharges were seen throughout the recording   LTM EEG 6/2 2231 to 6/3 0730: This study is within normal limits. No seizures or epileptiform discharges were seen throughout the recording.   ASSESSMENT   Kenedy Haisley is a 28 y.o. female with a history of CVA, DM1, ESRD on HD who presents with new onset seizures. One seizure occurred at home and two were witnessed in the ED, with postictal state. CTH and MRI Brain negative for acute process, shows late subacute to chronic infarct. Glucose continues to be elevated this AM, Cr chronically elevated with ESRD diagnosis (HD T,TH,Sa) but has increased this AM. Hyperkalemia has worsened  this morning after improving the past couple of days.  Patient takes Klonopin  and Atarax  at home for anxiety, these have not been continued inpatient. Could restart Klonopin  at half dose if withdrawal is a concern.   One episode has been captured with no concomitant EEG change. Ativan  was given this morning. Patient is lethargic versus post-ictal this am but able to talk to me and is oriented.   Impression: Pseudoseizures versus New-onset seizures in patient on dialysis with multiple electrolyte derangements  Recommendations   - cEEG for further spell capture (to be on for at least 24 hours) - continue seizure precautions - continue Keppra 500mg  Q24H - Electrolyte mgmt per Primary and Nephrolgy - CTA H&N and TTE to complete stroke workup - Continue eliquis    - Ativan  PRN for seizures lasting longer than 2 minutes, please call neurology if given.  ______________________________________________________________________   Cherl Corner, NP [facility]      Attending Neurohospitalist Addendum Patient seen and examined with APP/Resident. Agree with the history and physical as documented above. Agree with the plan as documented, which I helped formulate. I have edited the note above to reflect my full findings and recommendations. I have independently reviewed the chart, obtained history, review of systems and examined the patient.I have personally reviewed pertinent head/neck/spine imaging (CT/MRI). Please feel free to call with any questions.  -- Greg Leaks, MD [facility] [phone]  If 7pm- 7am, please page neurology on call as listed in AMION.

## 2024-01-29 NOTE — Progress Notes (Signed)
 PROGRESS NOTE                                                                                                                                                                                                             Patient Demographics:    Kathy Lewis, is a 28 y.o. female, DOB - [birth year], UEA:[id number]  Outpatient Primary MD for the patient is Belva Boyden, PA    LOS - 2  Admit date - 01/26/2024    Chief Complaint  Patient presents with   Emesis    N/V        Brief Narrative (HPI from H&P)   28 y.o. female with medical history significant for ESRD on HD TTS, type 1 diabetes, gastroparesis, postpartum cardiomyopathy, prior CVA, chronic anxiety, who initially presented to the ER with complaints of nausea, vomiting, and abdominal pain.  While in the ER, she had a witnessed generalized seizure by staff that lasted a few minutes.  She was given IV Ativan  with improvement.  However, she had another witnessed seizure like activity in the ER with generalized tonic-clonic movements.  No prior history of seizures.  Started on IV Keppra.  Nephrology consulted to assist with the management.  Noncontrast head CT showed no acute intracranial abnormality.  Admitted by [facility], hospitalist service.    Subjective:   Patient in bed, appears comfortable, denies any headache, no fever, no chest pain or pressure, no shortness of breath , no abdominal pain. No new focal weakness.   Assessment  & Plan :    New onset seizures/pseudoseizures.  Neurology on board, on Keppra, EEG while having jerking movements on 01/27/2024 unremarkable, overnight EEG again on 01/29/2024 unremarkable, repeat CT head on 01/28/2024 unremarkable, MRI noted with some nonspecific changes on 01/27/2024, neurology on board defer management to neurology.   Intractable nausea and vomiting.  Gastroparesis flareup.History of cyclical vomiting.  CT abdomen pelvis nonacute,  on scheduled Reglan  along with PPI, continue antiemetics and monitor. 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    History of DVT.  on Eliquis .   ESRD on HD.  Nephrology consulted.   Hypertensive urgency.  Placed on Coreg .  Stable, TSH stable.  Type 1 diabetes mellitus, with hyperglycemia with long-term insulin  use via insulin  pump with nephropathy and gastroparesis.  Insulin  pump  resumed with sliding scale, diabetic education  Lab Results  Component Value Date   HGBA1C 8.1 (H) 01/27/2024   CBG (last 3)  Recent Labs    01/29/24 0433 01/29/24 0747 01/29/24 0801  GLUCAP 259* 139* 137*        Condition - Fair  Family Communication  :  father bedside 01/29/24  Code Status :  Full  Consults  :  Renal, Neurology  PUD Prophylaxis :  PPI   Procedures  :     EEG - non acute  MRI - 1. New cortical T2 hyperintensity along the right parieto-occipital junction, suggestive of late subacute to chronic infarct. 2. Unchanged old infarct in the superior aspect of the right cerebellar hemisphere.      Disposition Plan  :    Status is: Inpatient   DVT Prophylaxis  :    apixaban  (ELIQUIS ) tablet 2.5 mg Start: 01/27/24 0415 apixaban  (ELIQUIS ) tablet 2.5 mg     Lab Results  Component Value Date   PLT 229 01/29/2024    Diet :  Diet Order             Diet Carb Modified Fluid consistency: Thin; Room service appropriate? Yes  Diet effective now                    Inpatient Medications  Scheduled Meds:  amitriptyline   25 mg Oral QHS   apixaban   2.5 mg Oral BID   carvedilol   3.125 mg Oral BID WC   Chlorhexidine  Gluconate Cloth  6 each Topical Daily   Chlorhexidine  Gluconate Cloth  6 each Topical Q0600   insulin  aspart  0-15 Units Subcutaneous Q6H   insulin  glargine-yfgn  10 Units Subcutaneous Daily   levETIRAcetam  250 mg Intravenous Q T,Th,Sat-1800   levETIRAcetam  500 mg Intravenous Q24H   LORazepam   0.5 mg  Intravenous Once   metoCLOPramide  (REGLAN ) injection  10 mg Intravenous Q6H   nitroGLYCERIN  0.5 inch Topical Q6H   pantoprazole  (PROTONIX ) IV  40 mg Intravenous Q24H   Continuous Infusions: PRN Meds:.acetaminophen , fentaNYL  (SUBLIMAZE ) injection, hydrALAZINE , labetalol , LORazepam , melatonin, naLOXone  (NARCAN )  injection, polyethylene glycol, prochlorperazine   Antibiotics  :    Anti-infectives (From admission, onward)    None         Objective:   Vitals:   01/29/24 0400 01/29/24 0500 01/29/24 0745 01/29/24 0810  BP: (!) 142/101 114/82 (!) 127/98 (!) 124/99  Pulse: (!) 111 98 98   Resp: 20 16 19 17   Temp: 98.1 F (36.7 C)  98.3 F (36.8 C) 98 F (36.7 C)  TempSrc:   Oral Axillary  SpO2: 97% 96% 98%     Wt Readings from Last 3 Encounters:  12/31/23 63.3 kg  12/18/23 68.5 kg  11/18/23 60 kg     Intake/Output Summary (Last 24 hours) at 01/29/2024 0939 Last data filed at 01/28/2024 2335 Gross per 24 hour  Intake --  Output 250 ml  Net -250 ml     Physical Exam  Awake alert, No new F.N deficits,   [place].AT,PERRAL Supple Neck, No JVD,   Symmetrical Chest wall movement, Good air movement bilaterally, CTAB RRR,No Gallops,Rubs or new Murmurs,  +ve B.Sounds, Abd Soft, No tenderness,   No Cyanosis, Clubbing or edema      Data Review:    Recent Labs  Lab 01/26/24 2305 01/26/24 2309 01/27/24 0553 01/28/24 0733 01/29/24 0825  WBC 5.6  --  6.8 8.7 11.2*  HGB 16.1* 17.3* 15.0 14.7  15.2*  HCT 47.5* 51.0* 44.5 42.8 45.0  PLT 201  --  177 215 229  MCV 98.3  --  98.5 98.2 99.6  MCH 33.3  --  33.2 33.7 33.6  MCHC 33.9  --  33.7 34.3 33.8  RDW 13.4  --  13.6 13.5 13.7  LYMPHSABS 1.3  --   --  0.8 1.2  MONOABS 0.4  --   --  0.6 0.8  EOSABS 0.1  --   --  0.0 0.0  BASOSABS 0.1  --   --  0.0 0.1    Recent Labs  Lab 01/26/24 2305 01/26/24 2309 01/27/24 0553 01/27/24 1655 01/28/24 0733 01/29/24 0825  NA 137 136 135 135 133* 134*  K 4.1 4.0 5.1 4.7 6.0* 4.3  CL  92* 96* 90* 91* 88* 91*  CO2 26  --  25 22 21* 27  ANIONGAP 19*  --  20* 22* 25* 16*  GLUCOSE 173* 182* 252* 269* 365* 129*  BUN 19 21* 28* 40* 52* 30*  CREATININE 5.59* 5.60* 6.46* 7.77* 9.93* 6.64*  AST  --   --   --   --  16 18  ALT  --   --   --   --  16 14  ALKPHOS  --   --   --   --  98 98  BILITOT  --   --   --   --  1.3* 1.2  ALBUMIN   --   --  4.1  --  4.2 4.1  TSH  --   --   --   --  1.032  --   HGBA1C  --   --  8.1*  --   --   --   MG  --   --  2.3  --  2.5* 2.3  PHOS  --   --  8.8*  --   --  10.1*  CALCIUM  10.1  --  10.1 10.0 9.7 9.6      Recent Labs  Lab 01/26/24 2305 01/27/24 0553 01/27/24 1655 01/28/24 0733 01/29/24 0825  TSH  --   --   --  1.032  --   HGBA1C  --  8.1*  --   --   --   MG  --  2.3  --  2.5* 2.3  CALCIUM  10.1 10.1 10.0 9.7 9.6    --------------------------------------------------------------------------------------------------------------- No results found for: "CHOL", "HDL", "LDLCALC", "LDLDIRECT", "TRIG", "CHOLHDL"  Lab Results  Component Value Date   HGBA1C 8.1 (H) 01/27/2024   Recent Labs    01/28/24 0733  TSH 1.032  FREET4 1.25*   No results for input(s): "VITAMINB12", "FOLATE", "FERRITIN", "TIBC", "IRON ", "RETICCTPCT" in the last 72 hours. ------------------------------------------------------------------------------------------------------------------ Cardiac Enzymes No results for input(s): "CKMB", "TROPONINI", "MYOGLOBIN" in the last 168 hours.  Invalid input(s): "CK"  Micro Results No results found for this or any previous visit (from the past 240 hours).  Radiology Report Overnight EEG with video Result Date: 01/29/2024 Arleene Lack, MD     01/29/2024  7:31 AM Patient Name: Tennyson Kallen MRN: [id number] Epilepsy Attending: Arleene Lack Referring Physician/Provider: Audrene Lease, NP  Duration: 01/29/2024 2231 to 01/30/2024 0730  Patient history: 28yo F wiyh seizure like activity. EEG to evaluate for seizure  Level of  alertness: Awake, asleep  AEDs during EEG study: LEV  Technical aspects: This EEG study was done with scalp electrodes positioned according to the 10-20 International system of electrode placement. Electrical activity was reviewed with  band pass filter of 1-70Hz , sensitivity of 7 uV/mm, display speed of 39mm/sec with a 60Hz  notched filter applied as appropriate. EEG data were recorded continuously and digitally stored.  Video monitoring was available and reviewed as appropriate.  Description: The posterior dominant rhythm consists of 8-9 Hz activity of moderate voltage (25-35 uV) seen predominantly in posterior head regions, symmetric and reactive to eye opening and eye closing. Sleep was characterized by vertex waves, sleep spindles (12 to 14 Hz), maximal frontocentral region. Physiologic photic driving was seen during photic stimulation.  Hyperventilation was not performed.    IMPRESSION: This study is within normal limits. No seizures or epileptiform discharges were seen throughout the recording.  A normal interictal EEG does not exclude the diagnosis of epilepsy.   Arleene Lack   EEG adult Result Date: 01/28/2024 Arleene Lack, MD     01/28/2024  1:05 PM Patient Name: Calandria Mullings MRN: [id number] Epilepsy Attending: Arleene Lack Referring Physician/Provider: Cala Castleman, MD Date: 01/28/2024 Duration: 22.46 mins Patient history: 28yo F wiyh seizure like activity. EEG to evaluate for seizure Level of alertness: Awake, asleep AEDs during EEG study: LEV Technical aspects: This EEG study was done with scalp electrodes positioned according to the 10-20 International system of electrode placement. Electrical activity was reviewed with band pass filter of 1-70Hz , sensitivity of 7 uV/mm, display speed of 70mm/sec with a 60Hz  notched filter applied as appropriate. EEG data were recorded continuously and digitally stored.  Video monitoring was available and reviewed as appropriate. Description: The  posterior dominant rhythm consists of 8-9 Hz activity of moderate voltage (25-35 uV) seen predominantly in posterior head regions, symmetric and reactive to eye opening and eye closing. Sleep was characterized by vertex waves, sleep spindles (12 to 14 Hz), maximal frontocentral region. Physiologic photic driving was seen during photic stimulation.  Hyperventilation was not performed.   IMPRESSION: This study is within normal limits. No seizures or epileptiform discharges were seen throughout the recording. A normal interictal EEG does not exclude the diagnosis of epilepsy. Priyanka Suzanne Erps   CT HEAD WO CONTRAST ( ) Result Date: 01/28/2024 CLINICAL DATA:  Provided history: Mental status change, unknown cause. New onset seizure. Emesis. EXAM: CT HEAD WITHOUT CONTRAST TECHNIQUE: Contiguous axial images were obtained from the base of the skull through the vertex without intravenous contrast. RADIATION DOSE REDUCTION: This exam was performed according to the departmental dose-optimization program which includes automated exposure control, adjustment of the mA and/or kV according to patient size and/or use of iterative reconstruction technique. COMPARISON:  Brain MRI 01/27/2024.  Head CT 6/152025. FINDINGS: Brain: No age-advanced or lobar predominant cerebral atrophy. The suspected late subacute to chronic right parieto-occipital cortical infarct described on yesterday's brain MRI is occult by CT. Known small chronic infarct within the superior right cerebellar hemisphere. There is no acute intracranial hemorrhage. No extra-axial fluid collection. No evidence of an intracranial mass. No midline shift. Vascular: No hyperdense vessel. Atherosclerotic calcifications. Skull: No calvarial fracture or aggressive osseous lesion. Sinuses/Orbits: No mass or acute finding within the imaged orbits. No significant paranasal sinus disease at the imaged levels. IMPRESSION: 1. The suspected late subacute to chronic right  parieto-occipital cortical infarct described on yesterday's brain MRI is occult by CT. 2. No CT evidence of an interval acute intracranial abnormality. 3. Known small chronic infarct within the superior right cerebellar hemisphere. Electronically Signed   By: Bascom Lily D.O.   On: 01/28/2024 08:11   MR BRAIN WO CONTRAST Result Date: 01/27/2024 EXAM:  MRI BRAIN WITHOUT CONTRAST 01/27/2024 04:00:00 PM TECHNIQUE: Multiplanar multisequence MRI of the head/brain was performed without the administration of intravenous contrast. COMPARISON: MRI brain 10/04/2022 and head CT 01/27/2024. CLINICAL HISTORY: Seizure, new-onset, no history of trauma. FINDINGS: BRAIN AND VENTRICLES: New cortical T2 hyperintensity along the right parieto-occipital junction seen on axial images 30 and 32 series 11, associated with subtle T1 hyperintensity and susceptibility artifact, suggestive of late subacute to chronic infarct. Unchanged old infarct in the superior aspect of the right cerebellar hemisphere. No acute hemorrhage. No mass. No midline shift. No hydrocephalus. The sella is unremarkable. Normal flow voids. ORBITS: No acute abnormality. SINUSES AND MASTOIDS: No acute abnormality. BONES AND SOFT TISSUES: Normal marrow signal. No acute soft tissue abnormality. IMPRESSION: 1. New cortical T2 hyperintensity along the right parieto-occipital junction, suggestive of late subacute to chronic infarct. 2. Unchanged old infarct in the superior aspect of the right cerebellar hemisphere. Electronically signed by: Audra Blend MD 01/27/2024 04:42 PM EDT RP Workstation: [id number]   CT ABDOMEN PELVIS WO CONTRAST Result Date: 01/27/2024 CLINICAL DATA:  Bowel obstruction suspected EXAM: CT ABDOMEN AND PELVIS WITHOUT CONTRAST TECHNIQUE: Multidetector CT imaging of the abdomen and pelvis was performed following the standard protocol without IV contrast. RADIATION DOSE REDUCTION: This exam was performed according to the departmental  dose-optimization program which includes automated exposure control, adjustment of the mA and/or kV according to patient size and/or use of iterative reconstruction technique. COMPARISON:  CT abdomen pelvis 07/18/2023 FINDINGS: Lower chest: Central venous catheter with tip terminating within the right atrium. Cardiomegaly. Possible trace hiatal hernia. Hepatobiliary: No focal liver abnormality. No gallstones, gallbladder wall thickening, or pericholecystic fluid. No biliary dilatation. Pancreas: No focal lesion. Normal pancreatic contour. No surrounding inflammatory changes. No main pancreatic ductal dilatation. Spleen: Normal in size without focal abnormality. Adrenals/Urinary Tract: No adrenal nodule bilaterally. No nephrolithiasis and no hydronephrosis. No definite contour-deforming renal mass. No ureterolithiasis or hydroureter. The urinary bladder is unremarkable. Stomach/Bowel: Stomach is within normal limits. No evidence of bowel wall thickening or dilatation. Appendix appears normal. Vascular/Lymphatic: No abdominal aorta or iliac aneurysm. No abdominal, pelvic, or inguinal lymphadenopathy. Reproductive: Uterus and bilateral adnexa are unremarkable. Other: No intraperitoneal free fluid. No intraperitoneal free gas. No organized fluid collection. Musculoskeletal: No abdominal wall hernia or abnormality. No suspicious lytic or blastic osseous lesions. No acute displaced fracture. IMPRESSION: 1. No acute intra-abdominal or intrapelvic abnormality. 2. Possible trace hiatal hernia. 3. Cardiomegaly. Electronically Signed   By: Morgane  Naveau M.D.   On: 01/27/2024 11:09     Signature  -   Lynnwood Sauer M.D on 01/29/2024 at 9:39 AM   -  To page go to [email or web address]

## 2024-01-29 NOTE — Progress Notes (Signed)
 [facility] Progress Note  Subjective:    Vitals:   01/29/24 0500 01/29/24 0745 01/29/24 0810 01/29/24 1125  BP: 114/82 (!) 127/98 (!) 124/99 (!) 128/92  Pulse: 98 98  99  Resp: 16 19 17 19   Temp:  98.3 F (36.8 C) 98 F (36.7 C) 97.9 F (36.6 C)  TempSrc:  Oral Axillary Oral  SpO2: 96% 98%  98%    Exam: Gen drowsy, pleasant, feeling better today No jvd or bruits Chest clear bilat to bases RRR no RG Abd soft ntnd no mass or ascites +bs Ext no pitting LE edema Neuro as above   RIJ TDC intact       Renal-related home meds: Phoslo  2 ac tid Velphoro  1 gm ac tid Coreg  3.125 mg bid Renavite daily Others: prilosec, robaxin , imdur , insulin  lispro, eliquis , statin, elavil     CXR - on 5/31, no active disease    OP HD: TTS SW  4h  B400   59kg  TDC   Heparin  2500 Last OP HD 5/31, post wt 61.4kg Very good compliance But not getting to dry wt, coming off 2-4kg over Hectorol 7 mcg IV three times per week     Assessment/ Plan: Seizures: new-onset, neurology consulting. Started on Keppra. F/b neurology.  Hyperkalemia: K+ 6.0 initially, K+ 4.3 today.  ESRD: on HD TTS. Had HD yest d/t ^K+. Next HD tomorrow off schedule, then resume TTS.  HTN: on low-dose coreg  as at home. BP's stable.  Volume: didn't tolerate any UF w/ HD yest, needs a good bed wt. No excess volume, probably a bit dry.  Anemia of esrd: Hb 12-15 here, follow.  Secondary hyperparathyroidism: CCa in range, phos is high, cont binders when eating.  DM type 1       Rob Jasan Doughtie MD  [facility] 01/29/2024, 2:36 PM  Recent Labs  Lab 01/27/24 0553 01/27/24 1655 01/28/24 0733 01/29/24 0825  HGB 15.0  --  14.7 15.2*  ALBUMIN  4.1  --  4.2 4.1  CALCIUM  10.1   < > 9.7 9.6  PHOS 8.8*  --   --  10.1*  CREATININE 6.46*   < > 9.93* 6.64*  K 5.1   < > 6.0* 4.3   < > = values in this interval not displayed.   No results for input(s): "IRON ", "TIBC", "FERRITIN" in the last 168 hours. Inpatient  medications:  amitriptyline   25 mg Oral QHS   apixaban   2.5 mg Oral BID   carvedilol   3.125 mg Oral BID WC   Chlorhexidine  Gluconate Cloth  6 each Topical Daily   Chlorhexidine  Gluconate Cloth  6 each Topical Q0600   insulin  aspart  0-15 Units Subcutaneous TID WC   [START ON 01/30/2024] insulin  glargine-yfgn  10 Units Subcutaneous Daily   levETIRAcetam  250 mg Intravenous Q T,Th,Sat-1800   levETIRAcetam  500 mg Intravenous Q24H   LORazepam   0.5 mg Intravenous Once   metoCLOPramide  (REGLAN ) injection  10 mg Intravenous Q6H   nitroGLYCERIN  0.5 inch Topical Q6H   pantoprazole  (PROTONIX ) IV  40 mg Intravenous Q24H    acetaminophen , fentaNYL  (SUBLIMAZE ) injection, hydrALAZINE , labetalol , LORazepam , melatonin, naLOXone  (NARCAN )  injection, polyethylene glycol, prochlorperazine 

## 2024-01-29 NOTE — Procedures (Signed)
 Patient Name: Kathy Lewis  MRN: [id number]  Epilepsy Attending: Arleene Lack  Referring Physician/Provider: Audrene Lease, NP   Duration: 01/29/2024 2231 to 01/30/2024 0730   Patient history: 28yo F wiyh seizure like activity. EEG to evaluate for seizure   Level of alertness: Awake, asleep   AEDs during EEG study: LEV   Technical aspects: This EEG study was done with scalp electrodes positioned according to the 10-20 International system of electrode placement. Electrical activity was reviewed with band pass filter of 1-70Hz , sensitivity of 7 uV/mm, display speed of 18mm/sec with a 60Hz  notched filter applied as appropriate. EEG data were recorded continuously and digitally stored.  Video monitoring was available and reviewed as appropriate.   Description: The posterior dominant rhythm consists of 8-9 Hz activity of moderate voltage (25-35 uV) seen predominantly in posterior head regions, symmetric and reactive to eye opening and eye closing. Sleep was characterized by vertex waves, sleep spindles (12 to 14 Hz), maximal frontocentral region. Physiologic photic driving was seen during photic stimulation.  Hyperventilation was not performed.      IMPRESSION: This study is within normal limits. No seizures or epileptiform discharges were seen throughout the recording.   A normal interictal EEG does not exclude the diagnosis of epilepsy.     Sedrick Tober O Jona Zappone

## 2024-01-30 ENCOUNTER — Inpatient Hospital Stay (HOSPITAL_COMMUNITY)

## 2024-01-30 ENCOUNTER — Other Ambulatory Visit (HOSPITAL_COMMUNITY)

## 2024-01-30 ENCOUNTER — Encounter (HOSPITAL_COMMUNITY)

## 2024-01-30 DIAGNOSIS — I639 Cerebral infarction, unspecified: Secondary | ICD-10-CM | POA: Diagnosis not present

## 2024-01-30 DIAGNOSIS — R569 Unspecified convulsions: Secondary | ICD-10-CM | POA: Diagnosis not present

## 2024-01-30 DIAGNOSIS — N186 End stage renal disease: Secondary | ICD-10-CM | POA: Diagnosis not present

## 2024-01-30 DIAGNOSIS — E1022 Type 1 diabetes mellitus with diabetic chronic kidney disease: Secondary | ICD-10-CM | POA: Diagnosis not present

## 2024-01-30 LAB — COMPREHENSIVE METABOLIC PANEL WITH GFR
ALT: 13 U/L (ref 0–44)
AST: 14 U/L — ABNORMAL LOW (ref 15–41)
Albumin: 3.8 g/dL (ref 3.5–5.0)
Alkaline Phosphatase: 89 U/L (ref 38–126)
Anion gap: 20 — ABNORMAL HIGH (ref 5–15)
BUN: 53 mg/dL — ABNORMAL HIGH (ref 6–20)
CO2: 23 mmol/L (ref 22–32)
Calcium: 9.5 mg/dL (ref 8.9–10.3)
Chloride: 91 mmol/L — ABNORMAL LOW (ref 98–111)
Creatinine, Ser: 9.17 mg/dL — ABNORMAL HIGH (ref 0.44–1.00)
GFR, Estimated: 6 mL/min — ABNORMAL LOW (ref >60)
Glucose, Bld: 169 mg/dL — ABNORMAL HIGH (ref 70–99)
Potassium: 5.1 mmol/L (ref 3.5–5.1)
Sodium: 134 mmol/L — ABNORMAL LOW (ref 135–145)
Total Bilirubin: 1.2 mg/dL (ref 0.0–1.2)
Total Protein: 7.9 g/dL (ref 6.5–8.1)

## 2024-01-30 LAB — CBC WITH DIFFERENTIAL/PLATELET
Abs Immature Granulocytes: 0.02 10*3/uL (ref 0.00–0.07)
Basophils Absolute: 0.1 10*3/uL (ref 0.0–0.1)
Basophils Relative: 1 %
Eosinophils Absolute: 0.1 10*3/uL (ref 0.0–0.5)
Eosinophils Relative: 1 %
HCT: 42.5 % (ref 36.0–46.0)
Hemoglobin: 14.5 g/dL (ref 12.0–15.0)
Immature Granulocytes: 0 %
Lymphocytes Relative: 17 %
Lymphs Abs: 1.4 10*3/uL (ref 0.7–4.0)
MCH: 34 pg (ref 26.0–34.0)
MCHC: 34.1 g/dL (ref 30.0–36.0)
MCV: 99.5 fL (ref 80.0–100.0)
Monocytes Absolute: 0.6 10*3/uL (ref 0.1–1.0)
Monocytes Relative: 8 %
Neutro Abs: 6.1 10*3/uL (ref 1.7–7.7)
Neutrophils Relative %: 73 %
Platelets: 229 10*3/uL (ref 150–400)
RBC: 4.27 MIL/uL (ref 3.87–5.11)
RDW: 13.4 % (ref 11.5–15.5)
WBC: 8.3 10*3/uL (ref 4.0–10.5)
nRBC: 0 % (ref 0.0–0.2)

## 2024-01-30 LAB — GLUCOSE, CAPILLARY
Glucose-Capillary: 161 mg/dL — ABNORMAL HIGH (ref 70–99)
Glucose-Capillary: 197 mg/dL — ABNORMAL HIGH (ref 70–99)
Glucose-Capillary: 164 mg/dL — ABNORMAL HIGH (ref 70–99)
Glucose-Capillary: 44 mg/dL — CL (ref 70–99)
Glucose-Capillary: 74 mg/dL (ref 70–99)
Glucose-Capillary: 92 mg/dL (ref 70–99)
Glucose-Capillary: 197 mg/dL — ABNORMAL HIGH (ref 70–99)
Glucose-Capillary: 237 mg/dL — ABNORMAL HIGH (ref 70–99)
Glucose-Capillary: 250 mg/dL — ABNORMAL HIGH (ref 70–99)

## 2024-01-30 LAB — MAGNESIUM: Magnesium: 2.3 mg/dL (ref 1.7–2.4)

## 2024-01-30 LAB — PHOSPHORUS: Phosphorus: >30 mg/dL — ABNORMAL HIGH (ref 2.5–4.6)

## 2024-01-30 MED ORDER — HYDRALAZINE HCL 20 MG/ML IJ SOLN
INTRAMUSCULAR | Status: AC
  Filled 2024-01-30: qty 1

## 2024-01-30 MED ORDER — LORAZEPAM 2 MG/ML IJ SOLN
INTRAMUSCULAR | Status: AC
  Filled 2024-01-30: qty 1

## 2024-01-30 MED ORDER — CARVEDILOL 6.25 MG PO TABS
6.2500 mg | ORAL_TABLET | Freq: Two times a day (BID) | ORAL | Status: DC
  Administered 2024-01-30: 6.25 mg via ORAL
  Filled 2024-01-30 (×2): qty 1

## 2024-01-30 MED ORDER — ATORVASTATIN CALCIUM 40 MG PO TABS
40.0000 mg | ORAL_TABLET | Freq: Every day | ORAL | Status: DC
  Administered 2024-01-30: 40 mg via ORAL
  Filled 2024-01-30: qty 1

## 2024-01-30 NOTE — Progress Notes (Signed)
 Patient had 2 seizure-like episodes, NP was at bedside for the first one, patient had her eyes flushed and instinctively closed her eyes. With the second episode, same pattern happened. Neurology has seen patient and assessed at bedside.

## 2024-01-30 NOTE — Progress Notes (Signed)
 PT Cancellation Note  Patient Details Name: Kathy Lewis MRN: [id number] DOB: 1996-08-08   Cancelled Treatment:    Reason Eval/Treat Not Completed: Patient at procedure or test/unavailable (Patient at hemodialysis this afternoon. PT will continue with attempts)  Ozie Bo, PT, MPT  Erlene Hawks 01/30/2024, 1:21 PM

## 2024-01-30 NOTE — Progress Notes (Signed)
 LTM maint complete - no skin breakdown seen. [facility] monitored, Event button test confirmed by [facility].

## 2024-01-30 NOTE — Progress Notes (Signed)
 Kathy Lewis Progress Note  Subjective:  Seen in room Feeling much better, no c/o's  Vitals:   01/30/24 1115 01/30/24 1318 01/30/24 1336 01/30/24 1400  BP: 95/71 (!) 137/102 (!) 146/107 (!) 167/121  Pulse: 98 97 95 97  Resp: 18 12 (!) 29 11  Temp: 98.1 F (36.7 C) 97.9 F (36.6 C)    TempSrc: Oral     SpO2: 98%  99% 97%  Weight:  60.3 kg      Exam: Gen stable, alert  No jvd or bruits Chest clear bilat to bases RRR no RG Abd soft ntnd no mass or ascites +bs Ext no pitting LE edema Neuro as above   RIJ TDC intact       Renal-related home meds: Phoslo  2 ac tid Velphoro  1 gm ac tid Coreg  3.125 mg bid Renavite daily Others: prilosec, robaxin , imdur , insulin  lispro, eliquis , statin, elavil     CXR - on 5/31, no active disease    OP HD: TTS SW  4h  B400   59kg  TDC   Heparin  2500 Last OP HD 5/31, post wt 61.4kg Very good compliance But not getting to dry wt, coming off 2-4kg over Hectorol 7 mcg IV three times per week     Assessment/ Plan: Seizures: new-onset, neurology consulting. Started on Keppra. Per pmd.  Hyperkalemia: K+ 6.0, resolved.  ESRD: on HD TTS. Had HD here Monday. She will have HD today off schedule, then next HD will be on Saturday.  HTN: on low-dose coreg  as at home. BP's stable.  Volume: didn't tolerate UF on HD Monday. At dry wt today. Keep even w/ HD.  Anemia of esrd: Hb 12-15 here, follow.  Secondary hyperparathyroidism: CCa in range, phos is high, cont binders when eating.  DM type 1 Dispo: ok for d/c from renal standpoint, have d/w pmd       [name]  [facility] 01/30/2024, 2:26 PM  Recent Labs  Lab 01/29/24 0825 01/30/24 0446  HGB 15.2* 14.5  ALBUMIN  4.1 3.8  CALCIUM  9.6 9.5  PHOS 10.1* >30.0*  CREATININE 6.64* 9.17*  K 4.3 5.1   No results for input(s): "IRON ", "TIBC", "FERRITIN" in the last 168 hours. Inpatient medications:  amitriptyline   25 mg Oral QHS   apixaban   2.5 mg Oral BID   carvedilol   6.25 mg Oral  BID WC   Chlorhexidine  Gluconate Cloth  6 each Topical Daily   Chlorhexidine  Gluconate Cloth  6 each Topical Q0600   Chlorhexidine  Gluconate Cloth  6 each Topical Q0600   insulin  aspart  0-15 Units Subcutaneous TID WC   insulin  glargine-yfgn  10 Units Subcutaneous Daily   levETIRAcetam  250 mg Intravenous Q T,Th,Sat-1800   levETIRAcetam  500 mg Intravenous Q24H   metoCLOPramide  (REGLAN ) injection  10 mg Intravenous Q6H   nitroGLYCERIN  0.5 inch Topical Q6H   pantoprazole  (PROTONIX ) IV  40 mg Intravenous Q24H    acetaminophen , fentaNYL  (SUBLIMAZE ) injection, hydrALAZINE , labetalol , LORazepam , melatonin, naLOXone  (NARCAN )  injection, polyethylene glycol, prochlorperazine 

## 2024-01-30 NOTE — Progress Notes (Signed)
 Brief Neuro Update:  Team requested STAT bedside evaluation for a seizure like episode. No to minimal post ictal period. She is awake, alert oriented x 3 and has no focal deficits. No tongue bite, no bladder or bowel incontinence. Discussed with Rns at the bedside who witnessed the events. Suspect this was a non epileptic event. Patient had her eyes closed. Provocation with saline in her eyes and was noted to squint her eyes tight shut during the seizure. Also had a event with photic stimulation that was captured on EEG on 01/27/24 and this was non epileptic.  Discussed with RN and primary team, avoid ativan . Squirt saline in eyes again if she has repeat episode overnight. No AEDs at this time.  Abraham Entwistle [facility]

## 2024-01-30 NOTE — Plan of Care (Signed)
  Problem: Health Behavior/Discharge Planning: Goal: Ability to manage health-related needs will improve Outcome: Progressing   Problem: Metabolic: Goal: Ability to maintain appropriate glucose levels will improve Outcome: Progressing   Problem: Nutritional: Goal: Maintenance of adequate nutrition will improve Outcome: Progressing   Problem: Skin Integrity: Goal: Risk for impaired skin integrity will decrease Outcome: Progressing   Problem: Clinical Measurements: Goal: Ability to maintain clinical measurements within normal limits will improve Outcome: Progressing Goal: Diagnostic test results will improve Outcome: Progressing Goal: Respiratory complications will improve Outcome: Progressing

## 2024-01-30 NOTE — Progress Notes (Signed)
 NEUROLOGY CONSULT FOLLOW UP NOTE   Date of service: January 30, 2024 Patient Name: Kathy Lewis MRN:  [id number] DOB:  [birth year]  Interval Hx/subjective  Family at the bedside. Patient is awake and alert sitting up in bed in NAD. LTM overnight is within normal limits.   Vitals   Vitals:   01/30/24 1702 01/30/24 1711 01/30/24 1712 01/30/24 1825  BP: (!) 137/96 (!) 131/96 (!) 140/100 110/70  Pulse: 98 97 (!) 102 97  Resp: 16 17 19    Temp:   98.3 F (36.8 C)   TempSrc:      SpO2: 96% 96% 97%   Weight:   60.3 kg      Body mass index is 25.96 kg/m.  Physical Exam   Constitutional: Appears well-developed and well-nourished.  Cardiovascular: Normal rate and regular rhythm.  Respiratory: Effort normal, non-labored breathing.   Neurologic Examination   Neuro: Mental Status: Patient is drowsy, but easily awakens and oriented to time, place, situation.  No signs of dysarthria, aphasia or neglect Cranial Nerves: II: Visual Fields are full. Pupils are equal, round, and reactive to light.   III,IV, VI: EOMI without ptosis or diploplia.  V: Facial sensation is symmetric to temperature VII: Facial movement is symmetric.  VIII: hearing is intact to voice X: No dysarthria XI: Shoulder shrug is symmetric. XII: tongue is midline without atrophy or fasciculations.  Motor: Tone is normal. Bulk is normal.  Improved generalized strength, 5/5 with no focal deficits.  Sensory: Sensation is symmetric to light touch in the arms and legs. Cerebellar: FNF intact bilaterally  Medications  Current Facility-Administered Medications:    acetaminophen  (TYLENOL ) tablet 650 mg, 650 mg, Oral, Q6H PRN, Bary Boss, DO, 650 mg at 01/27/24 2131   amitriptyline  (ELAVIL ) tablet 25 mg, 25 mg, Oral, QHS, Patel, Pranav M, MD, 25 mg at 01/29/24 2031   apixaban  (ELIQUIS ) tablet 2.5 mg, 2.5 mg, Oral, BID, Hall, Carole N, DO, 2.5 mg at 01/30/24 0818   carvedilol  (COREG ) tablet 6.25 mg, 6.25 mg, Oral, BID  WC, Singh, Prashant K, MD, 6.25 mg at 01/30/24 1825   Chlorhexidine  Gluconate Cloth 2 % PADS 6 each, 6 each, Topical, Daily, Patel, Pranav M, MD, 6 each at 01/29/24 4098   Chlorhexidine  Gluconate Cloth 2 % PADS 6 each, 6 each, Topical, Q0600, Chucky Craver, MD, 6 each at 01/30/24 0536   Chlorhexidine  Gluconate Cloth 2 % PADS 6 each, 6 each, Topical, Q0600, Chucky Craver, MD, 6 each at 01/30/24 0536   fentaNYL  (SUBLIMAZE ) injection 25 mcg, 25 mcg, Intravenous, Q2H PRN, Howerter, Justin B, DO, 25 mcg at 01/30/24 1301   hydrALAZINE  (APRESOLINE ) injection 10 mg, 10 mg, Intravenous, Q6H PRN, Singh, Prashant K, MD, 10 mg at 01/30/24 1509   insulin  aspart (novoLOG ) injection 0-15 Units, 0-15 Units, Subcutaneous, TID WC, Singh, Prashant K, MD, 3 Units at 01/30/24 1191   insulin  glargine-yfgn (SEMGLEE ) injection 10 Units, 10 Units, Subcutaneous, Daily, Singh, Prashant K, MD, 10 Units at 01/30/24 0948   labetalol  (NORMODYNE ) injection 10 mg, 10 mg, Intravenous, Q2H PRN, Patel, Pranav M, MD, 10 mg at 01/30/24 0330   levETIRAcetam (KEPPRA) undiluted injection 250 mg, 250 mg, Intravenous, Q T,Th,Sat-1800, Lindzen, Eric, MD, 250 mg at 01/29/24 1824   levETIRAcetam (KEPPRA) undiluted injection 500 mg, 500 mg, Intravenous, Q24H, Lindzen, Eric, MD, 500 mg at 01/30/24 4782   LORazepam  (ATIVAN ) injection 2 mg, 2 mg, Intravenous, Q6H PRN, Reesa Cannon N, DO, 2 mg at 01/28/24 0633   melatonin tablet 5  mg, 5 mg, Oral, QHS PRN, Bary Boss, DO   metoCLOPramide  (REGLAN ) injection 10 mg, 10 mg, Intravenous, Q6H, Patel, Pranav M, MD, 10 mg at 01/30/24 1825   naloxone  (NARCAN ) injection 0.4 mg, 0.4 mg, Intravenous, PRN, Howerter, Justin B, DO   nitroGLYCERIN (NITROGLYN) 2 % ointment 0.5 inch, 0.5 inch, Topical, Q6H, Kraig Peru, MD, 0.5 inch at 01/30/24 1822   pantoprazole  (PROTONIX ) injection 40 mg, 40 mg, Intravenous, Q24H, Patel, Pranav M, MD, 40 mg at 01/30/24 0948   polyethylene glycol (MIRALAX  / GLYCOLAX )  packet 17 g, 17 g, Oral, Daily PRN, Del Favia, Carole N, DO   prochlorperazine  (COMPAZINE ) injection 10 mg, 10 mg, Intravenous, Q6H PRN, Kraig Peru, MD, 10 mg at 01/29/24 2031  Labs and Diagnostic Imaging   CBC:  Recent Labs  Lab 01/29/24 0825 01/30/24 0446  WBC 11.2* 8.3  NEUTROABS 9.1* 6.1  HGB 15.2* 14.5  HCT 45.0 42.5  MCV 99.6 99.5  PLT 229 229    Basic Metabolic Panel:  Lab Results  Component Value Date   NA 134 (L) 01/30/2024   K 5.1 01/30/2024   CO2 23 01/30/2024   GLUCOSE 169 (H) 01/30/2024   BUN 53 (H) 01/30/2024   CREATININE 9.17 (H) 01/30/2024   CALCIUM  9.5 01/30/2024   GFRNONAA 6 (L) 01/30/2024   GFRAA 52 (L) 10/28/2019   Lipid Panel: No results found for: "LDLCALC" HgbA1c:  Lab Results  Component Value Date   HGBA1C 8.1 (H) 01/27/2024   Urine Drug Screen: No results found for: "LABOPIA", "COCAINSCRNUR", "LABBENZ", "AMPHETMU", "THCU", "LABBARB"  Alcohol Level     Component Value Date/Time   ETH <10 05/26/2023 1337   INR  Lab Results  Component Value Date   INR 1.0 10/22/2019   APTT  Lab Results  Component Value Date   APTT 28 10/22/2019   AED levels: No results found for: "PHENYTOIN", "ZONISAMIDE", "LAMOTRIGINE", "LEVETIRACETA"  CT Head without contrast(Personally reviewed): No acute intracranial abnormality.   Repeat CT Head 6/2:  The suspected late subacute to chronic right parieto-occipital cortical infarct described on yesterday's brain MRI is occult by CT. No CT evidence of an interval acute intracranial abnormality. Known small chronic infarct within the superior right cerebellar hemisphere.  CTA head and neck  No  LVO Right parietooccipital infarct,   MRI Brain(Personally reviewed): New cortical T2 hyperintensity along the right parieto-occipital junction, suggestive of late subacute to chronic infarct.  Neurodiagnostics rEEG 6/1:  Within normal limits. No seizures or epileptiform discharges were seen throughout the  recording. Patient was noted to have episodes of brief sudden whole body jerking during photic stimulation without concomitant EEG change. These were NON epileptic events.  rEEG 6/2: Within normal limits. No seizures or epileptiform discharges were seen throughout the recording   LTM EEG 6/2 2231 to 6/3 0730: This study is within normal limits. No seizures or epileptiform discharges were seen throughout the recording.   LTM EEG 6/3 2231- 6/4 0915 This study is within normal limits. No seizures or epileptiform discharges were seen throughout the recording.   Labs  A1c 8.1 LDL ordered   ASSESSMENT   Lener Ventresca is a 28 y.o. female with a history of CVA, DM1, ESRD on HD who presents with new onset seizures. One seizure occurred at home and two were witnessed in the ED, with postictal state. CTH and MRI Brain negative for acute process, shows late subacute to chronic infarct. Glucose continues to be elevated this AM, Cr chronically elevated with  ESRD diagnosis (HD T,TH,Sa) but has increased this AM. Hyperkalemia has worsened this morning after improving the past couple of days.  Patient takes Klonopin  and Atarax  at home for anxiety, these have not been continued inpatient. Could restart Klonopin  at half dose if withdrawal is a concern.   One episode has been captured with no concomitant EEG change. Ativan  was given this morning. Patient is lethargic versus post-ictal this am but able to talk to me and is oriented.   Impression:  Pseudoseizures Right parietal- occipital  ischemic infarct   Recommendations   - continue Keppra 500mg  Q24H - Electrolyte mgmt per Primary and Nephrology - Continue eliquis  - Echo is ordered - has not been done  - Lipid panel is ordered  - continue home atorvastatin  40mg . May need to increase dose if LDL > 70 - will need neurology outpatient follow up in 2 months after discharge  ______________________________________________________________________  Jonette Nestle DNP, ACNPC-AG  [facility]

## 2024-01-30 NOTE — Progress Notes (Signed)
 Received patient in bed to unit. [phone] Alert and oriented. A &O x 4 Informed consent signed and in chart. Yes  TX duration:3.5 hours  Patient tolerated well. Yes Transported back to the room via bed Alert, without acute distress.  Hand-off given to patient's nurse. Ranjita K. RN  Access used: Right CVC Access issues: None  Total UF removed: 0.0 Medication(s) given: Hydrazaline IV  Post HD weight: 60.6 kg Post HD VS: 144/98. 98% on RA 100 pulse , 24 Resp    Kathy Lewis S Mamie Diiorio RN, DNP [facility]

## 2024-01-30 NOTE — Progress Notes (Signed)
 PROGRESS NOTE                                                                                                                                                                                                             Patient Demographics:    Kathy Lewis, is a 28 y.o. female, DOB - 11-Feb-1996, UJW:[id number]  Outpatient Primary MD for the patient is Belva Boyden, PA    LOS - 3  Admit date - 01/26/2024    Chief Complaint  Patient presents with   Emesis    N/V        Brief Narrative (HPI from H&P)   28 y.o. female with medical history significant for ESRD on HD TTS, type 1 diabetes, gastroparesis, postpartum cardiomyopathy, prior CVA, chronic anxiety, who initially presented to the ER with complaints of nausea, vomiting, and abdominal pain.  While in the ER, she had a witnessed generalized seizure by staff that lasted a few minutes.  She was given IV Ativan  with improvement.  However, she had another witnessed seizure like activity in the ER with generalized tonic-clonic movements.  No prior history of seizures.  Started on IV Keppra.  Nephrology consulted to assist with the management.  Noncontrast head CT showed no acute intracranial abnormality.  Admitted by [facility], hospitalist service.    Subjective:   Patient in bed, appears comfortable, denies any headache, no fever, no chest pain or pressure, no shortness of breath , no abdominal pain. No focal weakness.   Assessment  & Plan :    New onset seizures/pseudoseizures.  Neurology on board, on Keppra, EEG while having jerking movements on 01/27/2024 unremarkable, overnight EEG again on 01/29/2024 unremarkable, repeat CT head on 01/28/2024 unremarkable, MRI noted with some nonspecific changes on 01/27/2024, neurology on board defer management to neurology.  Getting a echocardiogram and lipid panel per neurology for nonspecific MRI questionable subacute stroke.   Intractable  nausea and vomiting.  Gastroparesis flareup.History of cyclical vomiting.  CT abdomen pelvis nonacute, on scheduled Reglan  along with PPI, continue antiemetics and monitor. 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    History of DVT.  on Eliquis .   ESRD on HD.  Nephrology consulted.  HD on 01/30/2024 discussed with nephrology.   Hypertensive urgency.  Placed on Coreg , dose adjusted on 01/30/2023.  Stable, TSH stable.  Type 1 diabetes mellitus, with hyperglycemia with long-term insulin  use via insulin  pump with nephropathy and gastroparesis.  Insulin  pump resumed with sliding scale, diabetic education  Lab Results  Component Value Date   HGBA1C 8.1 (H) 01/27/2024   CBG (last 3)  Recent Labs    01/30/24 0240 01/30/24 0328 01/30/24 0726  GLUCAP 161* 197* 164*        Condition - Fair  Family Communication  :  father bedside 01/29/24 01/30/2024  Code Status :  Full  Consults  :  Renal, Neurology  PUD Prophylaxis :  PPI   Procedures  :     Echocardiogram.    EEG - non acute  MRI - 1. New cortical T2 hyperintensity along the right parieto-occipital junction, suggestive of late subacute to chronic infarct. 2. Unchanged old infarct in the superior aspect of the right cerebellar hemisphere.      Disposition Plan  :    Status is: Inpatient   DVT Prophylaxis  :    apixaban  (ELIQUIS ) tablet 2.5 mg Start: 01/27/24 0415 apixaban  (ELIQUIS ) tablet 2.5 mg     Lab Results  Component Value Date   PLT 229 01/30/2024    Diet :  Diet Order             Diet Carb Modified Fluid consistency: Thin; Room service appropriate? Yes  Diet effective now                    Inpatient Medications  Scheduled Meds:  amitriptyline   25 mg Oral QHS   apixaban   2.5 mg Oral BID   carvedilol   3.125 mg Oral BID WC   Chlorhexidine  Gluconate Cloth  6 each Topical Daily   Chlorhexidine  Gluconate Cloth  6 each Topical Q0600   Chlorhexidine   Gluconate Cloth  6 each Topical Q0600   insulin  aspart  0-15 Units Subcutaneous TID WC   insulin  glargine-yfgn  10 Units Subcutaneous Daily   levETIRAcetam  250 mg Intravenous Q T,Th,Sat-1800   levETIRAcetam  500 mg Intravenous Q24H   LORazepam   0.5 mg Intravenous Once   metoCLOPramide  (REGLAN ) injection  10 mg Intravenous Q6H   nitroGLYCERIN  0.5 inch Topical Q6H   pantoprazole  (PROTONIX ) IV  40 mg Intravenous Q24H   Continuous Infusions: PRN Meds:.acetaminophen , fentaNYL  (SUBLIMAZE ) injection, hydrALAZINE , labetalol , LORazepam , melatonin, naLOXone  (NARCAN )  injection, polyethylene glycol, prochlorperazine   Antibiotics  :    Anti-infectives (From admission, onward)    None         Objective:   Vitals:   01/29/24 2337 01/30/24 0400 01/30/24 0500 01/30/24 0729  BP: (!) 142/104 (!) 127/92  (!) 149/107  Pulse: (!) 109 90  97  Resp: 18 17  20   Temp: 98.1 F (36.7 C) 98 F (36.7 C)  98.4 F (36.9 C)  TempSrc: Oral Oral  Oral  SpO2: 98% 98%  98%  Weight:   62 kg     Wt Readings from Last 3 Encounters:  01/30/24 62 kg  12/31/23 63.3 kg  12/18/23 68.5 kg     Intake/Output Summary (Last 24 hours) at 01/30/2024 0915 Last data filed at 01/29/2024 2031 Gross per 24 hour  Intake 60 ml  Output --  Net 60 ml     Physical Exam  Awake alert, No new F.N deficits,   [place].AT,PERRAL Supple Neck, No JVD,   Symmetrical Chest wall movement, Good air movement bilaterally, CTAB RRR,No Gallops,Rubs or new Murmurs,  +ve B.Sounds, Abd Soft, No  tenderness,   No Cyanosis, Clubbing or edema      Data Review:    Recent Labs  Lab 01/26/24 2305 01/26/24 2309 01/27/24 0553 01/28/24 0733 01/29/24 0825 01/30/24 0446  WBC 5.6  --  6.8 8.7 11.2* 8.3  HGB 16.1* 17.3* 15.0 14.7 15.2* 14.5  HCT 47.5* 51.0* 44.5 42.8 45.0 42.5  PLT 201  --  177 215 229 229  MCV 98.3  --  98.5 98.2 99.6 99.5  MCH 33.3  --  33.2 33.7 33.6 34.0  MCHC 33.9  --  33.7 34.3 33.8 34.1  RDW 13.4  --  13.6  13.5 13.7 13.4  LYMPHSABS 1.3  --   --  0.8 1.2 1.4  MONOABS 0.4  --   --  0.6 0.8 0.6  EOSABS 0.1  --   --  0.0 0.0 0.1  BASOSABS 0.1  --   --  0.0 0.1 0.1    Recent Labs  Lab 01/27/24 0553 01/27/24 1655 01/28/24 0733 01/29/24 0825 01/30/24 0446  NA 135 135 133* 134* 134*  K 5.1 4.7 6.0* 4.3 5.1  CL 90* 91* 88* 91* 91*  CO2 25 22 21* 27 23  ANIONGAP 20* 22* 25* 16* 20*  GLUCOSE 252* 269* 365* 129* 169*  BUN 28* 40* 52* 30* 53*  CREATININE 6.46* 7.77* 9.93* 6.64* 9.17*  AST  --   --  16 18 14*  ALT  --   --  16 14 13   ALKPHOS  --   --  98 98 89  BILITOT  --   --  1.3* 1.2 1.2  ALBUMIN  4.1  --  4.2 4.1 3.8  TSH  --   --  1.032  --   --   HGBA1C 8.1*  --   --   --   --   MG 2.3  --  2.5* 2.3 2.3  PHOS 8.8*  --   --  10.1* >30.0*  CALCIUM  10.1 10.0 9.7 9.6 9.5      Recent Labs  Lab 01/27/24 0553 01/27/24 1655 01/28/24 0733 01/29/24 0825 01/30/24 0446  TSH  --   --  1.032  --   --   HGBA1C 8.1*  --   --   --   --   MG 2.3  --  2.5* 2.3 2.3  CALCIUM  10.1 10.0 9.7 9.6 9.5    --------------------------------------------------------------------------------------------------------------- No results found for: "CHOL", "HDL", "LDLCALC", "LDLDIRECT", "TRIG", "CHOLHDL"  Lab Results  Component Value Date   HGBA1C 8.1 (H) 01/27/2024   Recent Labs    01/28/24 0733  TSH 1.032  FREET4 1.25*   No results for input(s): "VITAMINB12", "FOLATE", "FERRITIN", "TIBC", "IRON ", "RETICCTPCT" in the last 72 hours. ------------------------------------------------------------------------------------------------------------------ Cardiac Enzymes No results for input(s): "CKMB", "TROPONINI", "MYOGLOBIN" in the last 168 hours.  Invalid input(s): "CK"  Micro Results No results found for this or any previous visit (from the past 240 hours).  Radiology Report CT ANGIO HEAD NECK W WO CM Result Date: 01/30/2024 EXAM: CTA HEAD AND NECK WITH AND WITHOUT 01/29/2024 08:06:00 PM TECHNIQUE:  CTA of the head and neck was performed with and without the administration of intravenous contrast. Multiplanar 2D and/or 3D reformatted images are provided for review. Automated exposure control, iterative reconstruction, and/or weight based adjustment of the mA/kV was utilized to reduce the radiation dose to as low as reasonably achievable. Stenosis of the internal carotid arteries measured using NASCET criteria. COMPARISON: CT head without contrast 01/28/2024. MR head without contrast 01/27/2024.  CLINICAL HISTORY: Neuro deficit, acute, stroke suspected; stroke on MRI. FINDINGS: CTA NECK: AORTIC ARCH AND ARCH VESSELS: No dissection or arterial injury. No significant stenosis of the brachiocephalic or subclavian arteries. CAROTID ARTERIES: Moderate tortuosity is present in the cervical internal carotid arteries bilaterally. No focal stenosis is present. VERTEBRAL ARTERIES: No dissection, arterial injury, or significant stenosis. SOFT TISSUES: The lung apices are clear. No cervical or superior mediastinal lymphadenopathy. The larynx and pharynx are unremarkable. No acute abnormality of the salivary and thyroid glands. BONES: No acute osseous abnormality. CTA HEAD: ANTERIOR CIRCULATION: No significant stenosis of the internal carotid arteries. No significant stenosis of the anterior cerebral arteries. No significant stenosis of the middle cerebral arteries. No aneurysm. POSTERIOR CIRCULATION: No significant stenosis of the posterior cerebral arteries. No significant stenosis of the basilar artery. No significant stenosis of the vertebral arteries. No aneurysm. OTHER: No dural venous sinus thrombosis on this non-dedicated study. BRAIN: Right parietooccipital infarct is again noted. No new infarcts are present. No acute intracranial hemorrhage. No mass effect or midline shift. IMPRESSION: 1. No large vessel occlusion in the head or neck. 2. Right parietooccipital infarct, stable compared to prior studies. No new  infarcts. Electronically signed by: Audree Leas MD 01/30/2024 07:48 AM EDT RP Workstation: [id number]   Overnight EEG with video Result Date: 01/29/2024 Arleene Lack, MD     01/29/2024  7:31 AM Patient Name: Durene Dodge MRN: [id number] Epilepsy Attending: Arleene Lack Referring Physician/Provider: Audrene Lease, NP  Duration: 01/29/2024 2231 to 01/30/2024 0730  Patient history: 28yo F wiyh seizure like activity. EEG to evaluate for seizure  Level of alertness: Awake, asleep  AEDs during EEG study: LEV  Technical aspects: This EEG study was done with scalp electrodes positioned according to the 10-20 International system of electrode placement. Electrical activity was reviewed with band pass filter of 1-70Hz , sensitivity of 7 uV/mm, display speed of 41mm/sec with a 60Hz  notched filter applied as appropriate. EEG data were recorded continuously and digitally stored.  Video monitoring was available and reviewed as appropriate.  Description: The posterior dominant rhythm consists of 8-9 Hz activity of moderate voltage (25-35 uV) seen predominantly in posterior head regions, symmetric and reactive to eye opening and eye closing. Sleep was characterized by vertex waves, sleep spindles (12 to 14 Hz), maximal frontocentral region. Physiologic photic driving was seen during photic stimulation.  Hyperventilation was not performed.    IMPRESSION: This study is within normal limits. No seizures or epileptiform discharges were seen throughout the recording.  A normal interictal EEG does not exclude the diagnosis of epilepsy.   Arleene Lack   EEG adult Result Date: 01/28/2024 Arleene Lack, MD     01/28/2024  1:05 PM Patient Name: Makaylen Thieme MRN: [id number] Epilepsy Attending: Arleene Lack Referring Physician/Provider: Cala Castleman, MD Date: 01/28/2024 Duration: 22.46 mins Patient history: 28yo F wiyh seizure like activity. EEG to evaluate for seizure Level of alertness: Awake, asleep AEDs during  EEG study: LEV Technical aspects: This EEG study was done with scalp electrodes positioned according to the 10-20 International system of electrode placement. Electrical activity was reviewed with band pass filter of 1-70Hz , sensitivity of 7 uV/mm, display speed of 38mm/sec with a 60Hz  notched filter applied as appropriate. EEG data were recorded continuously and digitally stored.  Video monitoring was available and reviewed as appropriate. Description: The posterior dominant rhythm consists of 8-9 Hz activity of moderate voltage (25-35 uV) seen predominantly in posterior head regions, symmetric and reactive  to eye opening and eye closing. Sleep was characterized by vertex waves, sleep spindles (12 to 14 Hz), maximal frontocentral region. Physiologic photic driving was seen during photic stimulation.  Hyperventilation was not performed.   IMPRESSION: This study is within normal limits. No seizures or epileptiform discharges were seen throughout the recording. A normal interictal EEG does not exclude the diagnosis of epilepsy. Priyanka O Yadav     Signature  -   Lynnwood Sauer M.D on 01/30/2024 at 9:15 AM   -  To page go to [email or web address]

## 2024-01-30 NOTE — Care Management Important Message (Signed)
 Important Message  Patient Details  Name: Kathy Lewis MRN: [id number] Date of Birth: 12-19-1995   Important Message Given:  Yes - Medicare IM     Wynonia Hedges 01/30/2024, 4:26 PM

## 2024-01-30 NOTE — Progress Notes (Signed)
 LTM EEG D/C'd. No noted skin break down. [facility] notified.

## 2024-01-30 NOTE — TOC CM/SW Note (Signed)
 Transition of Care [facility]) - Inpatient Brief Assessment   Patient Details  Name: Kathy Lewis MRN: [id number] Date of Birth: [birth year]  Transition of [facility]) CM/SW Contact:    Jannice Mends, LCSW Phone Number: 01/30/2024, 9:13 AM   Clinical Narrative: Patient admitted with nausea and vomiting and seizures/pseudoseizures. Patient receives outpatient dialysis. TOC follow for home health recommendation.    Transition of Care Asessment: Insurance and Status: Insurance coverage has been reviewed Patient has primary care physician: Yes Home environment has been reviewed: From home Prior level of function:: Independent Prior/Current Home Services: No current home services Social Drivers of Health Review: SDOH reviewed no interventions necessary Readmission risk has been reviewed: Yes Transition of care needs: transition of care needs identified, TOC will continue to follow

## 2024-01-30 NOTE — Procedures (Signed)
 Patient Name: Lakeasha Petion  MRN: [id number]  Epilepsy Attending: Arleene Lack  Referring Physician/Provider: Audrene Lease, NP   Duration: 01/29/2024 2231 to 01/30/2024 0915   Patient history: 28yo F wiyh seizure like activity. EEG to evaluate for seizure   Level of alertness: Awake, asleep   AEDs during EEG study: LEV   Technical aspects: This EEG study was done with scalp electrodes positioned according to the 10-20 International system of electrode placement. Electrical activity was reviewed with band pass filter of 1-70Hz , sensitivity of 7 uV/mm, display speed of 85mm/sec with a 60Hz  notched filter applied as appropriate. EEG data were recorded continuously and digitally stored.  Video monitoring was available and reviewed as appropriate.   Description: The posterior dominant rhythm consists of 8-9 Hz activity of moderate voltage (25-35 uV) seen predominantly in posterior head regions, symmetric and reactive to eye opening and eye closing. Sleep was characterized by vertex waves, sleep spindles (12 to 14 Hz), maximal frontocentral region.    IMPRESSION: This study is within normal limits. No seizures or epileptiform discharges were seen throughout the recording.   A normal interictal EEG does not exclude the diagnosis of epilepsy.     Emily Massar O Aneira Cavitt

## 2024-01-31 ENCOUNTER — Other Ambulatory Visit (HOSPITAL_COMMUNITY): Payer: Self-pay

## 2024-01-31 ENCOUNTER — Inpatient Hospital Stay (HOSPITAL_COMMUNITY)

## 2024-01-31 DIAGNOSIS — I160 Hypertensive urgency: Secondary | ICD-10-CM

## 2024-01-31 DIAGNOSIS — R569 Unspecified convulsions: Secondary | ICD-10-CM | POA: Diagnosis not present

## 2024-01-31 LAB — CBC WITH DIFFERENTIAL/PLATELET
Abs Immature Granulocytes: 0.01 10*3/uL (ref 0.00–0.07)
Basophils Absolute: 0.1 10*3/uL (ref 0.0–0.1)
Basophils Relative: 1 %
Eosinophils Absolute: 0.1 10*3/uL (ref 0.0–0.5)
Eosinophils Relative: 2 %
HCT: 46.4 % — ABNORMAL HIGH (ref 36.0–46.0)
Hemoglobin: 15.6 g/dL — ABNORMAL HIGH (ref 12.0–15.0)
Immature Granulocytes: 0 %
Lymphocytes Relative: 18 %
Lymphs Abs: 1 10*3/uL (ref 0.7–4.0)
MCH: 33.4 pg (ref 26.0–34.0)
MCHC: 33.6 g/dL (ref 30.0–36.0)
MCV: 99.4 fL (ref 80.0–100.0)
Monocytes Absolute: 0.5 10*3/uL (ref 0.1–1.0)
Monocytes Relative: 8 %
Neutro Abs: 4 10*3/uL (ref 1.7–7.7)
Neutrophils Relative %: 71 %
Platelets: 231 10*3/uL (ref 150–400)
RBC: 4.67 MIL/uL (ref 3.87–5.11)
RDW: 13.2 % (ref 11.5–15.5)
WBC: 5.7 10*3/uL (ref 4.0–10.5)
nRBC: 0 % (ref 0.0–0.2)

## 2024-01-31 LAB — GLUCOSE, CAPILLARY
Glucose-Capillary: 163 mg/dL — ABNORMAL HIGH (ref 70–99)
Glucose-Capillary: 261 mg/dL — ABNORMAL HIGH (ref 70–99)
Glucose-Capillary: 282 mg/dL — ABNORMAL HIGH (ref 70–99)
Glucose-Capillary: 321 mg/dL — ABNORMAL HIGH (ref 70–99)
Glucose-Capillary: 41 mg/dL — CL (ref 70–99)
Glucose-Capillary: 82 mg/dL (ref 70–99)

## 2024-01-31 LAB — LIPID PANEL
Cholesterol: 323 mg/dL — ABNORMAL HIGH (ref 0–200)
HDL: 70 mg/dL (ref >40)
LDL Cholesterol: 222 mg/dL — ABNORMAL HIGH (ref 0–99)
Total CHOL/HDL Ratio: 4.6 ratio
Triglycerides: 156 mg/dL — ABNORMAL HIGH (ref <150)
VLDL: 31 mg/dL (ref 0–40)

## 2024-01-31 LAB — PHOSPHORUS: Phosphorus: 8.2 mg/dL — ABNORMAL HIGH (ref 2.5–4.6)

## 2024-01-31 LAB — COMPREHENSIVE METABOLIC PANEL WITH GFR
ALT: 13 U/L (ref 0–44)
AST: 18 U/L (ref 15–41)
Albumin: 3.9 g/dL (ref 3.5–5.0)
Alkaline Phosphatase: 101 U/L (ref 38–126)
Anion gap: 19 — ABNORMAL HIGH (ref 5–15)
BUN: 32 mg/dL — ABNORMAL HIGH (ref 6–20)
CO2: 22 mmol/L (ref 22–32)
Calcium: 9.5 mg/dL (ref 8.9–10.3)
Chloride: 90 mmol/L — ABNORMAL LOW (ref 98–111)
Creatinine, Ser: 6.42 mg/dL — ABNORMAL HIGH (ref 0.44–1.00)
GFR, Estimated: 8 mL/min — ABNORMAL LOW (ref >60)
Glucose, Bld: 277 mg/dL — ABNORMAL HIGH (ref 70–99)
Potassium: 4.8 mmol/L (ref 3.5–5.1)
Sodium: 131 mmol/L — ABNORMAL LOW (ref 135–145)
Total Bilirubin: 1.3 mg/dL — ABNORMAL HIGH (ref 0.0–1.2)
Total Protein: 8.1 g/dL (ref 6.5–8.1)

## 2024-01-31 LAB — ECHOCARDIOGRAM COMPLETE BUBBLE STUDY
Area-P 1/2: 3.91 cm2
S' Lateral: 5.2 cm

## 2024-01-31 LAB — MAGNESIUM: Magnesium: 2.1 mg/dL (ref 1.7–2.4)

## 2024-01-31 MED ORDER — CARVEDILOL 6.25 MG PO TABS
6.2500 mg | ORAL_TABLET | Freq: Two times a day (BID) | ORAL | 0 refills | Status: DC
  Filled 2024-01-31: qty 60, 30d supply, fill #0

## 2024-01-31 MED ORDER — ROSUVASTATIN CALCIUM 20 MG PO TABS
20.0000 mg | ORAL_TABLET | Freq: Every day | ORAL | Status: DC
  Administered 2024-01-31: 20 mg via ORAL
  Filled 2024-01-31: qty 1

## 2024-01-31 MED ORDER — LEVETIRACETAM 500 MG PO TABS
500.0000 mg | ORAL_TABLET | Freq: Every day | ORAL | 0 refills | Status: AC
  Filled 2024-01-31: qty 30, 30d supply, fill #0

## 2024-01-31 MED ORDER — LEVETIRACETAM 250 MG PO TABS
ORAL_TABLET | ORAL | 0 refills | Status: AC
  Filled 2024-01-31: qty 30, 70d supply, fill #0

## 2024-01-31 MED ORDER — INSULIN ASPART 100 UNIT/ML IJ SOLN
15.0000 [IU] | Freq: Once | INTRAMUSCULAR | Status: AC
  Administered 2024-01-31: 10 [IU] via SUBCUTANEOUS

## 2024-01-31 NOTE — Discharge Instructions (Signed)
 Do not drive, operate heavy machinery, perform activities at heights, swimming or participation in water  activities or provide baby sitting services until you have seen by Primary MD or a Neurologist and advised to do so again.  Follow with Primary MD Belva Boyden, PA in 7 days   Get CBC, CMP, 2 view Chest X ray -  checked next visit with your primary MD   Activity: As tolerated with Full fall precautions use walker/cane & assistance as needed  Disposition Home   Diet: Low carbohydrate, strict 1.5 L fluid restriction, check CBGs q. [facility]  Special Instructions: If you have smoked or chewed Tobacco  in the last 2 yrs please stop smoking, stop any regular Alcohol  and or any Recreational drug use.  On your next visit with your primary care physician please Get Medicines reviewed and adjusted.  Please request your Prim.MD to go over all Hospital Tests and Procedure/Radiological results at the follow up, please get all Hospital records sent to your Prim MD by signing hospital release before you go home.  If you experience worsening of your admission symptoms, develop shortness of breath, life threatening emergency, suicidal or homicidal thoughts you must seek medical attention immediately by calling 911 or calling your MD immediately  if symptoms less severe.  You Must read complete instructions/literature along with all the possible adverse reactions/side effects for all the Medicines you take and that have been prescribed to you. Take any new Medicines after you have completely understood and accpet all the possible adverse reactions/side effects.   Do not drive when taking Pain medications.  Do not take more than prescribed Pain, Sleep and Anxiety Medications  Wear Seat belts while driving.

## 2024-01-31 NOTE — Progress Notes (Signed)
 Physical Therapy Treatment/ discharge Patient Details Name: Kathy Lewis MRN: [id number] DOB: [birth year] Today's Date: 01/31/2024   History of Present Illness 28 yo female admitted 01/26/24 with N/V abdominal pain after HD with seizure in ED. MRI of brain reported new cortical T2 hyperintensity along the right parieto-occipital junction,  suggestive of late subacute to chronic infarct, and unchanged old infarct.  PMH: CVA, DM1, ESRD on HD TTS, HFrEF, bipolar disorder, HTN    PT Comments  Pt alert, oriented, no pain and able to walk in hall and perform stairs. Pt reports enjoying crafting making cups and shirts and reports feeling back to baseline despite not recalling prior session. Pt independent with mobility and no further therapy needs at this time. Encouraged continued mobility and OOB during day. Will sign off with pt aware and agreeable.     If plan is discharge home, recommend the following:     Can travel by private vehicle        Equipment Recommendations  None recommended by PT    Recommendations for Other Services       Precautions / Restrictions Precautions Precautions: None     Mobility  Bed Mobility Overal bed mobility: Modified Independent                  Transfers Overall transfer level: Modified independent                      Ambulation/Gait Ambulation/Gait assistance: Independent Gait Distance (Feet): 650 Feet Assistive device: None Gait Pattern/deviations: WFL(Within Functional Limits)   Gait velocity interpretation: >2.62 ft/sec, indicative of community ambulatory       Stairs Stairs: Yes Stairs assistance: Modified independent (Device/Increase time) Stair Management: No rails, Forwards, Alternating pattern Number of Stairs: 2     Wheelchair Mobility     Tilt Bed    Modified Rankin (Stroke Patients Only)       Balance Overall balance assessment: No apparent balance deficits (not formally assessed)                                           Communication Communication Communication: No apparent difficulties  Cognition Arousal: Alert Behavior During Therapy: WFL for tasks assessed/performed   PT - Cognitive impairments: No apparent impairments                         Following commands: Intact      Cueing Cueing Techniques: Verbal cues  Exercises      General Comments        Pertinent Vitals/Pain Pain Assessment Pain Assessment: No/denies pain    Home Living                          Prior Function            PT Goals (current goals can now be found in the care plan section) Progress towards PT goals: Goals met/education completed, patient discharged from PT    Frequency           PT Plan      Co-evaluation              AM-PAC PT "6 Clicks" Mobility   Outcome Measure  Help needed turning from your back to your side while in a flat bed without using bedrails?:  None Help needed moving from lying on your back to sitting on the side of a flat bed without using bedrails?: None Help needed moving to and from a bed to a chair (including a wheelchair)?: None Help needed standing up from a chair using your arms (e.g., wheelchair or bedside chair)?: None Help needed to walk in hospital room?: None Help needed climbing 3-5 steps with a railing? : None 6 Click Score: 24    End of Session   Activity Tolerance: Patient tolerated treatment well Patient left: in chair;with call bell/phone within reach;with family/visitor present Nurse Communication: Mobility status PT Visit Diagnosis: Other abnormalities of gait and mobility (R26.89)     Time: 4540-9811 PT Time Calculation (min) (ACUTE ONLY): 12 min  Charges:    $Gait Training: 8-22 mins PT General Charges $$ ACUTE PT VISIT: 1 Visit                     Kathy Lewis, PT Acute Rehabilitation Services Office: [phone]    Kathy Lewis 01/31/2024, 11:33 AM

## 2024-01-31 NOTE — Progress Notes (Signed)
 [facility] Progress Note  Subjective:  Seen in room Doing well, no c/o's today  Vitals:   01/30/24 1712 01/30/24 1825 01/31/24 0850 01/31/24 0943  BP: (!) 140/100 110/70 (!) 138/103 107/69  Pulse: (!) 102 97 95   Resp: 19 17 19 19   Temp: 98.3 F (36.8 C) 98.2 F (36.8 C) 98.5 F (36.9 C)   TempSrc:  Oral Oral   SpO2: 97% 97% 94% 94%  Weight: 60.3 kg       Exam: Gen stable, alert  No jvd or bruits Chest clear bilat to bases RRR no RG Abd soft ntnd no mass or ascites +bs Ext no pitting LE edema Neuro as above   RIJ TDC intact       Renal-related home meds: Phoslo  2 ac tid Velphoro  1 gm ac tid Coreg  3.125 mg bid Renavite daily Others: prilosec, robaxin , imdur , insulin  lispro, eliquis , statin, elavil     CXR - on 5/31, no active disease    OP HD: TTS SW  4h  B400   59kg  TDC   Heparin  2500 Last OP HD 5/31, post wt 61.4kg Very good compliance But not getting to dry wt, coming off 2-4kg over Hectorol 7 mcg IV three times per week     Assessment/ Plan: Seizures: new-onset, neurology saw the patient. Started on Keppra.  ESRD: on HD TTS. Had HD here Monday & wed off schedule. Next HD Sat HTN: on low-dose coreg  as at home. BP's stable.  Volume: didn't tolerate UF on HD Monday. Remains close to dry wt.  Anemia of esrd: Hb 12-15 here, follow.  Secondary hyperparathyroidism: CCa in range, phos is high, cont binders when eating.  DM type 1 Dispo: per pmd       [name]  [facility] 01/31/2024, 1:12 PM  Recent Labs  Lab 01/30/24 0446 01/31/24 0444  HGB 14.5 15.6*  ALBUMIN  3.8 3.9  CALCIUM  9.5 9.5  PHOS >30.0* 8.2*  CREATININE 9.17* 6.42*  K 5.1 4.8   No results for input(s): "IRON ", "TIBC", "FERRITIN" in the last 168 hours. Inpatient medications:  amitriptyline   25 mg Oral QHS   apixaban   2.5 mg Oral BID   carvedilol   6.25 mg Oral BID WC   Chlorhexidine  Gluconate Cloth  6 each Topical Daily   Chlorhexidine  Gluconate Cloth  6 each Topical  Q0600   Chlorhexidine  Gluconate Cloth  6 each Topical Q0600   insulin  aspart  0-15 Units Subcutaneous TID WC   insulin  glargine-yfgn  10 Units Subcutaneous Daily   levETIRAcetam  250 mg Intravenous Q T,Th,Sat-1800   levETIRAcetam  500 mg Intravenous Q24H   metoCLOPramide  (REGLAN ) injection  10 mg Intravenous Q6H   pantoprazole  (PROTONIX ) IV  40 mg Intravenous Q24H   rosuvastatin  20 mg Oral Daily    acetaminophen , hydrALAZINE , labetalol , LORazepam , melatonin, naLOXone  (NARCAN )  injection, polyethylene glycol, prochlorperazine 

## 2024-01-31 NOTE — Progress Notes (Signed)
 Speech Language Pathology Treatment: Dysphagia  Patient Details Name: Kathy Lewis MRN: [id number] DOB: 1996/03/21 Today's Date: 01/31/2024 Time: 1478-2956 SLP Time Calculation (min) (ACUTE ONLY): 8 min  Assessment / Plan / Recommendation Clinical Impression  On initial assessment pt with nausea and did not want to have solids. Today she states the nausea has subsided but her stomach hurts most every time she eats. Her oropharyngeal swallow is within normal limits without s/s aspiration. Mastication was timely and complete. She denies dysphagia and GERD. She states she has been ordering food she feels she can eat and SLP recommended bland foods if she feels this would help her stomach pain. Recommend continue regular texture, thin liquids, pills with liquids. No follow up needed.    HPI HPI: Kathy Lewis is a 28 yo female presenting to ED 5/31 with nausea, vomiting, and abdominal pain. In the ED, pt had a witnessed generalized seizure with postictal state. MRI shows acute cortical T2 hyperintensity along the R parieto-occipital junction, suggestive of late subacute to chronic infarct, and an unchanged chronic infarct. PMH includes prior CVA, T1DM, ESRD on HD      SLP Plan  All goals met;Discharge SLP treatment due to (comment)      Recommendations for follow up therapy are one component of a multi-disciplinary discharge planning process, led by the attending physician.  Recommendations may be updated based on patient status, additional functional criteria and insurance authorization.    Recommendations  Diet recommendations: Regular;Thin liquid Liquids provided via: Cup;Straw Medication Administration: Whole meds with liquid Supervision: Patient able to self feed Postural Changes and/or Swallow Maneuvers: Seated upright 90 degrees                  Oral care BID   None Dysphagia, unspecified (R13.10)     All goals met;Discharge SLP treatment due to (comment)     Kathy Lewis  01/31/2024, 9:04 AM

## 2024-01-31 NOTE — Discharge Planning (Signed)
 [facility] Patient Discharge Orders- [facility] CLINIC: [place]  Patient's name: Kathy Lewis Admit/DC Dates: 01/26/2024 - 01/31/24  Discharge Diagnoses: New onset seizures   Intractable nausea and vomiting  Aranesp: Given: no   Date and amount of last dose: N/A  Last Hgb: 15.6 PRBC's Given: no Date/# of units: N/A ESA dose for discharge: none IV Iron  dose at discharge: none  Heparin  change: no  EDW Change: no New EDW:   [place] Change: no  Access intervention/Change: no Details:  Hectorol/Calcitriol  change: no  Discharge Labs: Calcium  9.5 Phosphorus 8.2 Albumin  3.9 K+ 4.8  IV Antibiotics: no Details:  On Coumadin?: no Last INR: Next INR: Managed By:   OTHER/APPTS/LAB ORDERS:    D/C Meds to be reconciled by nurse after every discharge.  Completed By: Ramona Burner, PA-C 01/31/2024, 4:28 PM  [facility] Pager: [phone]    Reviewed by: MD:______ RN_______

## 2024-01-31 NOTE — Significant Event (Signed)
 Rapid Response Event Note   Reason for Call :  Unresponsiveness    Initial Focused Assessment:  Pt lying in bed with eyes closed. She is beginning to move around on my assessment. She is able to squeeze B hands, and wiggles B toes. She says she is having ABD pain and some SOB. She denies HA. Pupils 5, equal, reactive. Lungs CTA. ABD soft. Skin warm and dry.   HR-82, BP-136/110, RR-14, SpO2-97%.   While obtaining EKG, pt begin to have slow, generalized shaking. When checking for a gaze, L sided gaze present but eyes went to center just after opening eyes. Shaking aborted and eyes squinted shut with flush of NS to pt's eyes.   Interventions:  CBG-250 PCXR-Cardiac enlargement. No evidence of active pulmonary disease.  EKG-Sinus rhythm with occasional Premature ventricular complexes. Nonspecific T wave abnormality. 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 Continue to monitor pt. Please call RRT if further assistance needed.  Event Summary:   MD Notified: Mansy/Khaliqdina Call Time:2302 Arrival Time:2306 End [id number]  Dorrine Gaudy, RN

## 2024-01-31 NOTE — Discharge Summary (Signed)
 Makinzey Banes NWG:[id number] DOB: 31-May-1996 DOA: 01/26/2024  PCP: Belva Boyden, PA  Admit date: 01/26/2024  Discharge date: 01/31/2024  Admitted From: Home   Disposition:  Home   Recommendations for Outpatient Follow-up:   Follow up with PCP in 1-2 weeks  PCP Please obtain BMP/CBC, 2 view CXR in 1week,  (see Discharge instructions)   PCP Please follow up on the following pending results:    Home Health: None   Equipment/Devices: None  Consultations: Neuro, renal Discharge Condition: Stable    CODE STATUS: Full    Diet Recommendation: Low carbohydrate diet with strict 1.5 L fluid restriction per day     Chief Complaint  Patient presents with   Emesis    N/V      Brief history of present illness from the day of admission and additional interim summary     28 y.o. female with medical history significant for ESRD on HD TTS, type 1 diabetes, gastroparesis, postpartum cardiomyopathy, prior CVA, chronic anxiety, who initially presented to the ER with complaints of nausea, vomiting, and abdominal pain.  While in the ER, she had a witnessed generalized seizure by staff that lasted a few minutes.  She was given IV Ativan  with improvement.  However, she had another witnessed seizure like activity in the ER with generalized tonic-clonic movements.  No prior history of seizures.  Started on IV Keppra.  Nephrology consulted to assist with the management.  Noncontrast head CT showed no acute intracranial abnormality.  Admitted by [facility], hospitalist service.                                                                    Hospital Course   New onset seizures/pseudoseizures.  Neurology on board, on Keppra, EEG while having jerking movements on 01/27/2024 unremarkable, overnight EEG again on 01/29/2024 unremarkable,  repeat CT head on 01/28/2024 unremarkable, MRI noted with some nonspecific changes on 01/27/2024, neurology on board is discussed with Dr. Doretta Gant.  Getting a echocardiogram to be checked prior to discharge, LDL was above goal hence switched her statin to Crestor, outpatient follow-up with neurology.  No focal deficits.  Continue Eliquis .   Intractable nausea and vomiting.  Gastroparesis flareup.History of cyclical vomiting.  CT abdomen pelvis nonacute, on scheduled Reglan  along with PPI, continue antiemetics and monitor. 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  She has been tolerating diet for the last 2 days, no nausea vomiting.  If reoccurs outpatient GI follow-up.   History of DVT.  on Eliquis .   ESRD on HD.  Nephrology consulted.  HD on 01/30/2024 discussed with nephrology.  HD on Saturday.   Hypertensive urgency.  increased home Coreg  dose stable.   Chr.Systolic CHF Ef 25% - compensated, no ACE/ARB due to  ESRD, on coreg .  Type 1 diabetes mellitus, with hyperglycemia with long-term insulin  use via insulin  pump with nephropathy and gastroparesis.  Insulin  pump resume check CBGs q. [facility] S    Discharge diagnosis     Principal Problem:   New onset seizure (HCC) Active Problems:   Intractable nausea and vomiting   Essential hypertension   ESRD on dialysis (HCC)   Uncontrolled type 1 diabetes mellitus with hyperglycemia, with long-term current use of insulin  (HCC)   Prolonged QT interval   Chronic HFrEF (heart failure with reduced ejection fraction) (HCC)   Bipolar 2 disorder (HCC)   Gastroparesis   Internal jugular vein thrombosis, right (HCC)   Ischemic stroke [facility])    Discharge instructions    Discharge Instructions     Discharge instructions   Complete by: As directed    Do not drive, operate heavy machinery, perform activities at heights, swimming or participation in water  activities or provide baby sitting services until you have  seen by Primary MD or a Neurologist and advised to do so again.  Follow with Primary MD Belva Boyden, PA in 7 days   Get CBC, CMP, 2 view Chest X ray -  checked next visit with your primary MD   Activity: As tolerated with Full fall precautions use walker/cane & assistance as needed  Disposition Home   Diet: Low carbohydrate, strict 1.5 L fluid restriction, check CBGs q. [facility] S  Special Instructions: If you have smoked or chewed Tobacco  in the last 2 yrs please stop smoking, stop any regular Alcohol  and or any Recreational drug use.  On your next visit with your primary care physician please Get Medicines reviewed and adjusted.  Please request your Prim.MD to go over all Hospital Tests and Procedure/Radiological results at the follow up, please get all Hospital records sent to your Prim MD by signing hospital release before you go home.  If you experience worsening of your admission symptoms, develop shortness of breath, life threatening emergency, suicidal or homicidal thoughts you must seek medical attention immediately by calling 911 or calling your MD immediately  if symptoms less severe.  You Must read complete instructions/literature along with all the possible adverse reactions/side effects for all the Medicines you take and that have been prescribed to you. Take any new Medicines after you have completely understood and accpet all the possible adverse reactions/side effects.   Do not drive when taking Pain medications.  Do not take more than prescribed Pain, Sleep and Anxiety Medications  Wear Seat belts while driving.       Discharge Medications   Allergies as of 01/31/2024       Reactions   Cantaloupe Extract Allergy Skin Test Itching, Other (See Comments)   Mouth itching   Citrullus Vulgaris Itching, Other (See Comments)   Makes mouth itch, ALL melons     Food Itching, Other (See Comments)   All melon - mouth itching   Strawberry Extract Itching, Other  (See Comments)   Mouth itching   Nsaids Itching, Other (See Comments)   Avoid per nephrology        Medication List     STOP taking these medications    atorvastatin  40 MG tablet Commonly known as: LIPITOR        TAKE these medications    acetaminophen  500 MG tablet Commonly known as: TYLENOL  Take 1,000 mg by mouth daily as needed for headache (pain).   amitriptyline  50 MG tablet Commonly known as: ELAVIL  Take  1 tablet (50 mg total) by mouth at bedtime.   calcium  acetate 667 MG capsule Commonly known as: PHOSLO  Take 1,334 mg by mouth 3 (three) times daily with meals.   carvedilol  6.25 MG tablet Commonly known as: COREG  Take 1 tablet (6.25 mg total) by mouth 2 (two) times daily with a meal. What changed:  medication strength how much to take   clonazePAM  0.5 MG tablet Commonly known as: KLONOPIN  Take 1 tablet (0.5 mg total) by mouth 2 (two) times daily as needed for anxiety. What changed: when to take this   Eliquis  2.5 MG Tabs tablet Generic drug: apixaban  Take 2.5 mg by mouth 2 (two) times daily.   hydrOXYzine  10 MG tablet Commonly known as: ATARAX  Take 10 mg by mouth daily.   insulin  lispro 100 UNIT/ML injection Commonly known as: HUMALOG  Inject 0.5 Units into the skin See admin instructions. 0.5 units per hour via insulin  pump.   isosorbide  mononitrate 30 MG 24 hr tablet Commonly known as: IMDUR  Take 0.5 tablets (15 mg total) by mouth daily.   levETIRAcetam 500 MG tablet Commonly known as: Keppra Take 1 tablet (500 mg total) by mouth daily at 6 (six) AM.   levETIRAcetam 250 MG tablet Commonly known as: Keppra Take one tablet by mouth Tuesday, Thursday, and Saturday after you HD treatment   magnesium  oxide 400 MG tablet Commonly known as: MAG-OX Take 2 tablets (800 mg total) by mouth daily.   methocarbamol  500 MG tablet Commonly known as: ROBAXIN  Take 500 mg by mouth as needed for muscle spasms.   metoCLOPramide  10 MG tablet Commonly  known as: REGLAN  Take 1 tablet (10 mg total) by mouth every 6 (six) hours.   omeprazole 20 MG capsule Commonly known as: PRILOSEC Take 20 mg by mouth daily.   Rena-Vite Rx 1 MG Tabs Take 1 tablet by mouth daily.   SUMAtriptan  50 MG tablet Commonly known as: IMITREX  Take 50 mg by mouth daily as needed for headache.   Ubrelvy  100 MG Tabs Generic drug: Ubrogepant  Take 100 mg by mouth daily as needed (migraine).   valACYclovir  500 MG tablet Commonly known as: VALTREX  Take 500 mg by mouth daily as needed (cold sores).   Velphoro  500 MG chewable tablet Generic drug: sucroferric oxyhydroxide Chew 1,000 mg by mouth 3 (three) times daily with meals.   Vitamin D  (Ergocalciferol ) 1.25 MG (50000 UNIT) Caps capsule Commonly known as: DRISDOL  Take 50,000 Units by mouth every Sunday.         Follow-up Information     Garcia-Grider, Angela M, PA. Schedule an appointment as soon as possible for a visit in 1 week(s).   Specialty: Physician Assistant Contact information: [address] [place] [address] [phone]         [facility]. Schedule an appointment as soon as possible for a visit in 1 week(s).   Contact information: [address]     [address] [place] [address] [phone]                Major procedures and Radiology Reports - PLEASE review detailed and final reports thoroughly  -        ECHOCARDIOGRAM COMPLETE BUBBLE STUDY Result Date: 01/31/2024    ECHOCARDIOGRAM REPORT   Patient Name:   Dwayna Peet Date of Exam: 01/31/2024 Medical Rec #:  [id number]     Height:       60 .0 in Accession #:    [id number]    Weight:  132.9 lb Date of Birth:  [birth year]     BSA:          1.569 m Patient Age:    28 years      BP:           107/69 mmHg Patient Gender: F             HR:           84 bpm. Exam Location:  Inpatient Procedure: 2D Echo, Saline Contrast Bubble Study, Cardiac Doppler and Color            Doppler  (Both Spectral and Color Flow Doppler were utilized during            procedure). Indications:    Stroke 434.91/I63.9  History:        Patient has prior history of Echocardiogram examinations, most                 recent 06/07/2023. CHF; Risk Factors:Hypertension, Diabetes and                 Dyslipidemia.  Sonographer:    Kip Peon RDCS Referring Phys: [id number] Alphonza Ashing STACK IMPRESSIONS  1. Left ventricular ejection fraction, by estimation, is 20 to 25%. The left ventricle has severely decreased function. The left ventricle demonstrates global hypokinesis. Left ventricular diastolic parameters are consistent with Grade II diastolic dysfunction (pseudonormalization).  2. Right ventricular systolic function is mildly reduced. The right ventricular size is normal. Tricuspid regurgitation signal is inadequate for assessing PA pressure. The estimated right ventricular systolic pressure is 10.2 mmHg.  3. The mitral valve is normal in structure. No evidence of mitral valve regurgitation. No evidence of mitral stenosis.  4. The aortic valve is tricuspid. Aortic valve regurgitation is not visualized. No aortic stenosis is present.  5. The inferior vena cava is normal in size with greater than 50% respiratory variability, suggesting right atrial pressure of 3 mmHg.  6. Agitated saline contrast bubble study was negative, with no evidence of any interatrial shunt. FINDINGS  Left Ventricle: Left ventricular ejection fraction, by estimation, is 20 to 25%. The left ventricle has severely decreased function. The left ventricle demonstrates global hypokinesis. The left ventricular internal cavity size was normal in size. There is no left ventricular hypertrophy. Left ventricular diastolic parameters are consistent with Grade II diastolic dysfunction (pseudonormalization). Right Ventricle: The right ventricular size is normal. No increase in right ventricular wall thickness. Right ventricular systolic function is mildly  reduced. Tricuspid regurgitation signal is inadequate for assessing PA pressure. The tricuspid regurgitant velocity is 1.34 m/s, and with an assumed right atrial pressure of 3 mmHg, the estimated right ventricular systolic pressure is 10.2 mmHg. Left Atrium: Left atrial size was normal in size. Right Atrium: Right atrial size was normal in size. Pericardium: There is no evidence of pericardial effusion. Mitral Valve: The mitral valve is normal in structure. No evidence of mitral valve regurgitation. No evidence of mitral valve stenosis. Tricuspid Valve: The tricuspid valve is normal in structure. Tricuspid valve regurgitation is trivial. No evidence of tricuspid stenosis. Aortic Valve: The aortic valve is tricuspid. Aortic valve regurgitation is not visualized. No aortic stenosis is present. Pulmonic Valve: The pulmonic valve was normal in structure. Pulmonic valve regurgitation is mild. No evidence of pulmonic stenosis. Aorta: The aortic root is normal in size and structure. Venous: The inferior vena cava is normal in size with greater than 50% respiratory variability, suggesting right atrial pressure of 3 mmHg. IAS/Shunts: No  atrial level shunt detected by color flow Doppler. Agitated saline contrast was given intravenously to evaluate for intracardiac shunting. Agitated saline contrast bubble study was negative, with no evidence of any interatrial shunt. There  is no evidence of a patent foramen ovale. There is no evidence of an atrial septal defect.  LEFT VENTRICLE PLAX 2D LVIDd:         5.50 cm   Diastology LVIDs:         5.20 cm   LV e' medial:    3.75 cm/s LV PW:         1.40 cm   LV E/e' medial:  12.8 LV IVS:        0.90 cm   LV e' lateral:   5.70 cm/s LVOT diam:     1.70 cm   LV E/e' lateral: 8.4 LV SV:         23 LV SV Index:   15 LVOT Area:     2.27 cm  RIGHT VENTRICLE            IVC RV Basal diam:  3.70 cm    IVC diam: 1.30 cm RV S prime:     6.23 cm/s TAPSE (M-mode): 1.0 cm LEFT ATRIUM           Index         RIGHT ATRIUM           Index LA diam:      3.20 cm 2.04 cm/m   RA Area:     12.20 cm LA Vol (A2C): 41.4 ml 26.38 ml/m  RA Volume:   25.10 ml  16.00 ml/m LA Vol (A4C): 26.8 ml 17.08 ml/m  AORTIC VALVE LVOT Vmax:   64.00 cm/s LVOT Vmean:  40.700 cm/s LVOT VTI:    0.101 m  AORTA Ao Root diam: 2.60 cm Ao Asc diam:  3.10 cm MITRAL VALVE               TRICUSPID VALVE MV Area (PHT): 3.91 cm    TR Peak grad:   7.2 mmHg MV Decel Time: 194 msec    TR Vmax:        134.00 cm/s MV E velocity: 47.90 cm/s MV A velocity: 38.10 cm/s  SHUNTS MV E/A ratio:  1.26        Systemic VTI:  0.10 m                            Systemic Diam: 1.70 cm Dorothye Gathers MD Electronically signed by Dorothye Gathers MD Signature Date/Time: 01/31/2024/3:43:38 PM    Final    DG Chest Port 1 View Result Date: 01/30/2024 CLINICAL DATA:  Acute respiratory distress EXAM: PORTABLE CHEST 1 VIEW COMPARISON:  01/26/2024 FINDINGS: Right central venous catheter with tip over the right atrium. No pneumothorax. Cardiac enlargement. No vascular congestion, edema, or consolidation. No pleural effusion or pneumothorax. Mediastinal contours appear intact. IMPRESSION: Cardiac enlargement.  No evidence of active pulmonary disease. Electronically Signed   By: Boyce Byes M.D.   On: 01/30/2024 23:29   CT ANGIO HEAD NECK W WO CM Result Date: 01/30/2024 EXAM: CTA HEAD AND NECK WITH AND WITHOUT 01/29/2024 08:06:00 PM TECHNIQUE: CTA of the head and neck was performed with and without the administration of intravenous contrast. Multiplanar 2D and/or 3D reformatted images are provided for review. Automated exposure control, iterative reconstruction, and/or weight based adjustment of the mA/kV was utilized to reduce the radiation  dose to as low as reasonably achievable. Stenosis of the internal carotid arteries measured using NASCET criteria. COMPARISON: CT head without contrast 01/28/2024. MR head without contrast 01/27/2024. CLINICAL HISTORY: Neuro deficit, acute,  stroke suspected; stroke on MRI. FINDINGS: CTA NECK: AORTIC ARCH AND ARCH VESSELS: No dissection or arterial injury. No significant stenosis of the brachiocephalic or subclavian arteries. CAROTID ARTERIES: Moderate tortuosity is present in the cervical internal carotid arteries bilaterally. No focal stenosis is present. VERTEBRAL ARTERIES: No dissection, arterial injury, or significant stenosis. SOFT TISSUES: The lung apices are clear. No cervical or superior mediastinal lymphadenopathy. The larynx and pharynx are unremarkable. No acute abnormality of the salivary and thyroid glands. BONES: No acute osseous abnormality. CTA HEAD: ANTERIOR CIRCULATION: No significant stenosis of the internal carotid arteries. No significant stenosis of the anterior cerebral arteries. No significant stenosis of the middle cerebral arteries. No aneurysm. POSTERIOR CIRCULATION: No significant stenosis of the posterior cerebral arteries. No significant stenosis of the basilar artery. No significant stenosis of the vertebral arteries. No aneurysm. OTHER: No dural venous sinus thrombosis on this non-dedicated study. BRAIN: Right parietooccipital infarct is again noted. No new infarcts are present. No acute intracranial hemorrhage. No mass effect or midline shift. IMPRESSION: 1. No large vessel occlusion in the head or neck. 2. Right parietooccipital infarct, stable compared to prior studies. No new infarcts. Electronically signed by: Audree Leas MD 01/30/2024 07:48 AM EDT RP Workstation: [id number]   Overnight EEG with video Result Date: 01/29/2024 Arleene Lack, MD     01/30/2024  9:21 AM Patient Name: Jenicka Coxe MRN: [id number] Epilepsy Attending: Arleene Lack Referring Physician/Provider: Audrene Lease, NP  Duration: 01/28/2024 2231 to 01/29/2024 2231  Patient history: 28yo F wiyh seizure like activity. EEG to evaluate for seizure  Level of alertness: Awake, asleep  AEDs during EEG study: LEV  Technical aspects: This EEG  study was done with scalp electrodes positioned according to the 10-20 International system of electrode placement. Electrical activity was reviewed with band pass filter of 1-70Hz , sensitivity of 7 uV/mm, display speed of 71mm/sec with a 60Hz  notched filter applied as appropriate. EEG data were recorded continuously and digitally stored.  Video monitoring was available and reviewed as appropriate.  Description: The posterior dominant rhythm consists of 8-9 Hz activity of moderate voltage (25-35 uV) seen predominantly in posterior head regions, symmetric and reactive to eye opening and eye closing. Sleep was characterized by vertex waves, sleep spindles (12 to 14 Hz), maximal frontocentral region.  IMPRESSION: This study is within normal limits. No seizures or epileptiform discharges were seen throughout the recording.  A normal interictal EEG does not exclude the diagnosis of epilepsy.   Arleene Lack   EEG adult Result Date: 01/28/2024 Arleene Lack, MD     01/28/2024  1:05 PM Patient Name: Carlynn Leduc MRN: [id number] Epilepsy Attending: Arleene Lack Referring Physician/Provider: Cala Castleman, MD Date: 01/28/2024 Duration: 22.46 mins Patient history: 28yo F wiyh seizure like activity. EEG to evaluate for seizure Level of alertness: Awake, asleep AEDs during EEG study: LEV Technical aspects: This EEG study was done with scalp electrodes positioned according to the 10-20 International system of electrode placement. Electrical activity was reviewed with band pass filter of 1-70Hz , sensitivity of 7 uV/mm, display speed of 35mm/sec with a 60Hz  notched filter applied as appropriate. EEG data were recorded continuously and digitally stored.  Video monitoring was available and reviewed as appropriate. Description: The posterior dominant rhythm consists of 8-9 Hz activity of  moderate voltage (25-35 uV) seen predominantly in posterior head regions, symmetric and reactive to eye opening and eye closing. Sleep  was characterized by vertex waves, sleep spindles (12 to 14 Hz), maximal frontocentral region. Physiologic photic driving was seen during photic stimulation.  Hyperventilation was not performed.   IMPRESSION: This study is within normal limits. No seizures or epileptiform discharges were seen throughout the recording. A normal interictal EEG does not exclude the diagnosis of epilepsy. Priyanka Suzanne Erps   CT HEAD WO CONTRAST ( ) Result Date: 01/28/2024 CLINICAL DATA:  Provided history: Mental status change, unknown cause. New onset seizure. Emesis. EXAM: CT HEAD WITHOUT CONTRAST TECHNIQUE: Contiguous axial images were obtained from the base of the skull through the vertex without intravenous contrast. RADIATION DOSE REDUCTION: This exam was performed according to the departmental dose-optimization program which includes automated exposure control, adjustment of the mA and/or kV according to patient size and/or use of iterative reconstruction technique. COMPARISON:  Brain MRI 01/27/2024.  Head CT 6/152025. FINDINGS: Brain: No age-advanced or lobar predominant cerebral atrophy. The suspected late subacute to chronic right parieto-occipital cortical infarct described on yesterday's brain MRI is occult by CT. Known small chronic infarct within the superior right cerebellar hemisphere. There is no acute intracranial hemorrhage. No extra-axial fluid collection. No evidence of an intracranial mass. No midline shift. Vascular: No hyperdense vessel. Atherosclerotic calcifications. Skull: No calvarial fracture or aggressive osseous lesion. Sinuses/Orbits: No mass or acute finding within the imaged orbits. No significant paranasal sinus disease at the imaged levels. IMPRESSION: 1. The suspected late subacute to chronic right parieto-occipital cortical infarct described on yesterday's brain MRI is occult by CT. 2. No CT evidence of an interval acute intracranial abnormality. 3. Known small chronic infarct within the superior  right cerebellar hemisphere. Electronically Signed   By: Bascom Lily D.O.   On: 01/28/2024 08:11   MR BRAIN WO CONTRAST Result Date: 01/27/2024 EXAM: MRI BRAIN WITHOUT CONTRAST 01/27/2024 04:00:00 PM TECHNIQUE: Multiplanar multisequence MRI of the head/brain was performed without the administration of intravenous contrast. COMPARISON: MRI brain 10/04/2022 and head CT 01/27/2024. CLINICAL HISTORY: Seizure, new-onset, no history of trauma. FINDINGS: BRAIN AND VENTRICLES: New cortical T2 hyperintensity along the right parieto-occipital junction seen on axial images 30 and 32 series 11, associated with subtle T1 hyperintensity and susceptibility artifact, suggestive of late subacute to chronic infarct. Unchanged old infarct in the superior aspect of the right cerebellar hemisphere. No acute hemorrhage. No mass. No midline shift. No hydrocephalus. The sella is unremarkable. Normal flow voids. ORBITS: No acute abnormality. SINUSES AND MASTOIDS: No acute abnormality. BONES AND SOFT TISSUES: Normal marrow signal. No acute soft tissue abnormality. IMPRESSION: 1. New cortical T2 hyperintensity along the right parieto-occipital junction, suggestive of late subacute to chronic infarct. 2. Unchanged old infarct in the superior aspect of the right cerebellar hemisphere. Electronically signed by: Audra Blend MD 01/27/2024 04:42 PM EDT RP Workstation: [id number]   CT ABDOMEN PELVIS WO CONTRAST Result Date: 01/27/2024 CLINICAL DATA:  Bowel obstruction suspected EXAM: CT ABDOMEN AND PELVIS WITHOUT CONTRAST TECHNIQUE: Multidetector CT imaging of the abdomen and pelvis was performed following the standard protocol without IV contrast. RADIATION DOSE REDUCTION: This exam was performed according to the departmental dose-optimization program which includes automated exposure control, adjustment of the mA and/or kV according to patient size and/or use of iterative reconstruction technique. COMPARISON:  CT abdomen pelvis 07/18/2023  FINDINGS: Lower chest: Central venous catheter with tip terminating within the right atrium. Cardiomegaly. Possible trace hiatal hernia. Hepatobiliary: No  focal liver abnormality. No gallstones, gallbladder wall thickening, or pericholecystic fluid. No biliary dilatation. Pancreas: No focal lesion. Normal pancreatic contour. No surrounding inflammatory changes. No main pancreatic ductal dilatation. Spleen: Normal in size without focal abnormality. Adrenals/Urinary Tract: No adrenal nodule bilaterally. No nephrolithiasis and no hydronephrosis. No definite contour-deforming renal mass. No ureterolithiasis or hydroureter. The urinary bladder is unremarkable. Stomach/Bowel: Stomach is within normal limits. No evidence of bowel wall thickening or dilatation. Appendix appears normal. Vascular/Lymphatic: No abdominal aorta or iliac aneurysm. No abdominal, pelvic, or inguinal lymphadenopathy. Reproductive: Uterus and bilateral adnexa are unremarkable. Other: No intraperitoneal free fluid. No intraperitoneal free gas. No organized fluid collection. Musculoskeletal: No abdominal wall hernia or abnormality. No suspicious lytic or blastic osseous lesions. No acute displaced fracture. IMPRESSION: 1. No acute intra-abdominal or intrapelvic abnormality. 2. Possible trace hiatal hernia. 3. Cardiomegaly. Electronically Signed   By: Morgane  Naveau M.D.   On: 01/27/2024 11:09   EEG adult Result Date: 01/27/2024 Arleene Lack, MD     01/27/2024  4:45 PM Patient Name: Navpreet Szczygiel MRN: [id number] Epilepsy Attending: Arleene Lack Referring Physician/Provider: Bary Boss, DO Date: 01/27/2024 Duration: 22.24 min Patient history: 28yo F with seizure like activity. EEG to evaluate for seizure. Level of alertness: Awake, asleep AEDs during EEG study: LEV, Ativan  Technical aspects: This EEG study was done with scalp electrodes positioned according to the 10-20 International system of electrode placement. Electrical activity was  reviewed with band pass filter of 1-70Hz , sensitivity of 7 uV/mm, display speed of 46mm/sec with a 60Hz  notched filter applied as appropriate. EEG data were recorded continuously and digitally stored.  Video monitoring was available and reviewed as appropriate. Description: The posterior dominant rhythm consists of 9 Hz activity of moderate voltage (25-35 uV) seen predominantly in posterior head regions, symmetric and reactive to eye opening and eye closing.  Sleep was characterized by vertex waves, sleep spindles (12 to 14 Hz), maximal frontocentral region.  Patient was noted to have episodes of brief sudden whole body jerking during photic stimulation. Concomitant EEG before, during and after the event did not show any EEG changes suggest seizure. Physiologic photic driving was seen during photic stimulation.  Hyperventilation was not performed.   IMPRESSION: This study is within normal limits. No seizures or epileptiform discharges were seen throughout the recording. Patient was noted to have episodes of brief sudden whole body jerking during photic stimulation without concomitant EEG change. These were NON epileptic events. A normal interictal EEG does not exclude the diagnosis of epilepsy. Arleene Lack   CT Head Wo Contrast Result Date: 01/27/2024 EXAM: CT HEAD WITHOUT 01/27/2024 12:06:28 AM TECHNIQUE: CT of the head was performed without the administration of intravenous contrast. Automated exposure control, iterative reconstruction, and/or weight based adjustment of the mA/kV was utilized to reduce the radiation dose to as low as reasonably achievable. COMPARISON: 11/18/2023 CLINICAL HISTORY: Seizure, new-onset, no history of trauma. Chief complaints; Emesis. FINDINGS: BRAIN AND VENTRICLES: There is no acute intracranial hemorrhage, mass effect or midline shift. No abnormal extra-axial fluid collection. The gray-white differentiation is maintained without evidence of an acute infarct. There is no  evidence of hydrocephalus. ORBITS: The visualized portion of the orbits demonstrate no acute abnormality. SINUSES: The visualized paranasal sinuses and mastoid air cells demonstrate no acute abnormality. SOFT TISSUES AND SKULL: No acute abnormality of the visualized skull or soft tissues. IMPRESSION: 1. No acute intracranial abnormality. Electronically signed by: Zadie Herter MD 01/27/2024 12:09 AM EDT RP Workstation: [id number]  DG Chest Portable 1 View Result Date: 01/26/2024 CLINICAL DATA:  Vomiting EXAM: PORTABLE CHEST 1 VIEW COMPARISON:  11/18/2023 FINDINGS: Shallow inspiration. Cardiac enlargement. No vascular congestion, edema, or consolidation. No pleural effusion or pneumothorax. Mediastinal contours appear intact. Right central venous catheter with tip over the cavoatrial junction region. Mediastinal contours appear intact. IMPRESSION: Cardiac enlargement.  No evidence of active pulmonary disease. Electronically Signed   By: Boyce Byes M.D.   On: 01/26/2024 23:54    Micro Results     No results found for this or any previous visit (from the past 240 hours).  Today   Subjective    Kalene Cutler today has no headache,no chest abdominal pain,no new weakness tingling or numbness, feels much better wants to go home today.     Objective   Blood pressure 135/70, pulse 97, temperature 98.5 F (36.9 C), temperature source Oral, resp. rate 19, weight 60.3 kg, SpO2 94%.   Intake/Output Summary (Last 24 hours) at 01/31/2024 1607 Last data filed at 01/30/2024 1712 Gross per 24 hour  Intake --  Output 0 ml  Net 0 ml    Exam  Awake Alert, No new F.N deficits,    [place].AT,PERRAL Supple Neck,   Symmetrical Chest wall movement, Good air movement bilaterally, CTAB RRR,No Gallops,   +ve B.Sounds, Abd Soft, Non tender,  No Cyanosis, Clubbing or edema    Data Review   Recent Labs  Lab 01/26/24 2305 01/26/24 2309 01/27/24 0553 01/28/24 0733 01/29/24 0825 01/30/24 0446  01/31/24 0444  WBC 5.6  --  6.8 8.7 11.2* 8.3 5.7  HGB 16.1*   < > 15.0 14.7 15.2* 14.5 15.6*  HCT 47.5*   < > 44.5 42.8 45.0 42.5 46.4*  PLT 201  --  177 215 229 229 231  MCV 98.3  --  98.5 98.2 99.6 99.5 99.4  MCH 33.3  --  33.2 33.7 33.6 34.0 33.4  MCHC 33.9  --  33.7 34.3 33.8 34.1 33.6  RDW 13.4  --  13.6 13.5 13.7 13.4 13.2  LYMPHSABS 1.3  --   --  0.8 1.2 1.4 1.0  MONOABS 0.4  --   --  0.6 0.8 0.6 0.5  EOSABS 0.1  --   --  0.0 0.0 0.1 0.1  BASOSABS 0.1  --   --  0.0 0.1 0.1 0.1   < > = values in this interval not displayed.    Recent Labs  Lab 01/27/24 0553 01/27/24 1655 01/28/24 0733 01/29/24 0825 01/30/24 0446 01/31/24 0444  NA 135 135 133* 134* 134* 131*  K 5.1 4.7 6.0* 4.3 5.1 4.8  CL 90* 91* 88* 91* 91* 90*  CO2 25 22 21* 27 23 22   ANIONGAP 20* 22* 25* 16* 20* 19*  GLUCOSE 252* 269* 365* 129* 169* 277*  BUN 28* 40* 52* 30* 53* 32*  CREATININE 6.46* 7.77* 9.93* 6.64* 9.17* 6.42*  AST  --   --  16 18 14* 18  ALT  --   --  16 14 13 13   ALKPHOS  --   --  98 98 89 101  BILITOT  --   --  1.3* 1.2 1.2 1.3*  ALBUMIN  4.1  --  4.2 4.1 3.8 3.9  TSH  --   --  1.032  --   --   --   HGBA1C 8.1*  --   --   --   --   --   MG 2.3  --  2.5* 2.3 2.3  2.1  PHOS 8.8*  --   --  10.1* >30.0* 8.2*  CALCIUM  10.1 10.0 9.7 9.6 9.5 9.5    Total Time in preparing paper work, data evaluation and todays exam - 35 minutes  Signature  -    Lynnwood Sauer M.D on 01/31/2024 at 4:07 PM   -  To page go to [email or web address]

## 2024-01-31 NOTE — Inpatient Diabetes Management (Signed)
 Inpatient Diabetes Program Recommendations  AACE/ADA: New Consensus Statement on Inpatient Glycemic Control (2015)  Target Ranges:  Prepandial:   less than 140 mg/dL      Peak postprandial:   less than 180 mg/dL (1-2 hours)      Critically ill patients:  140 - 180 mg/dL   Lab Results  Component Value Date   GLUCAP 163 (H) 01/31/2024   HGBA1C 8.1 (H) 01/27/2024    Review of Glycemic Control  Latest Reference Range & Units 01/30/24 07:26 01/30/24 11:18 01/30/24 11:41 01/30/24 18:20 01/30/24 19:31  Glucose-Capillary 70 - 99 mg/dL 161 (H)  Novolog  3 units 44 (LL) 74 92 197 (H)  (LL): Data is critically low (H): Data is abnormally high   Latest Reference Range & Units 01/30/24 21:19 01/30/24 23:03 01/31/24 00:25 01/31/24 03:28 01/31/24 08:55  Glucose-Capillary 70 - 99 mg/dL 096 (H) 045 (H) 409 (H) 282 (H) 163 (H)  (H): Data is abnormally high  Diabetes history: DM1 Outpatient Diabetes medications:  OmniPod insulin  pump with Dexcom CGM; insulin  pump settings (0.5 units/hour (total basal 12 units per day), 1 unit per 8 grams carbs, 1 unit drops glucose 50 mg/dl) per settings  Current orders for Inpatient glycemic control: Semglee  10 units every day, Novolog  0-15 units TID  Inpatient Diabetes Program Recommendations:    Patient became hypoglycemic after correction on 01/30/24 @ 11:18.  Might consider decreasing correction to Novolog  0-9 units TID.    Thank you, Hays Lipschutz, MSN, CDCES Diabetes Coordinator Inpatient Diabetes Program [phone] (team pager from 8a-5p)

## 2024-01-31 NOTE — Progress Notes (Signed)
 Echocardiogram 2D Echocardiogram has been performed.  Farley Honer, RDCS 01/31/2024, 1:58 PM

## 2024-02-01 ENCOUNTER — Encounter: Admitting: Vascular Surgery

## 2024-02-01 ENCOUNTER — Other Ambulatory Visit (HOSPITAL_COMMUNITY)

## 2024-02-01 ENCOUNTER — Encounter (HOSPITAL_COMMUNITY)

## 2024-02-01 ENCOUNTER — Telehealth: Payer: Self-pay | Admitting: Physician Assistant

## 2024-02-01 NOTE — Telephone Encounter (Signed)
 Transition of Care - Initial Contact after Hospitalization  Date of discharge:  01/31/24 Date of contact: 02/01/24  Method: Phone Spoke to: Patient  Patient contacted to discuss transition of care from recent inpatient hospitalization. Patient was admitted to [facility] from 01/26/24 to 01/31/24 with discharge diagnosis of: new onset seizures  The discharge medication list was reviewed. Patient understands the changes and has no concerns.   Patient will return to his/her outpatient HD unit on: Saturday  No other concerns at this time.  Ramona Burner, PA-C 02/01/2024, 1:59 PM  [facility] Pager: [phone]

## 2024-02-06 NOTE — Progress Notes (Deleted)
 Office Note     CC:  ESRD Requesting Provider:  Cristi Donalds, MD  HPI: Kathy Lewis is a {Handed:22697} handed 28 y.o. ([birth year]) female with kidney disease who presents at the request of Cristi Donalds, MD for permanent HD access. The patient has had *** prior access procedures. Per pt, previous tunneled lines have been placed in ***. Current access is ***. Dialysis days are ***.   On exam, ***  The pt is *** on a statin for cholesterol management.  The pt is *** on a daily aspirin .   Other AC:  *** The pt is *** on medications for hypertension.   The pt is *** diabetic. Tobacco hx:  ***  Past Medical History:  Diagnosis Date   Anemia    Anxiety    Bipolar 2 disorder (HCC)    Chronic systolic (congestive) heart failure (HCC)    Depression    DKA (diabetic ketoacidoses)    ESRD on hemodialysis (HCC)    TTS at [facility]   HSV infection    on valtrex    Hypokalemia    Leukocytosis    Migraine    Noncompliance with medication regimen    Preeclampsia    Prolonged QT syndrome    Severe anemia    Type 1 diabetes mellitus (HCC)     Past Surgical History:  Procedure Laterality Date   BIOPSY  04/24/2022   Procedure: BIOPSY;  Surgeon: Elois Hair, MD;  Location: [facility] ENDOSCOPY;  Service: Gastroenterology;;   CARDIAC CATHETERIZATION     COLONOSCOPY WITH PROPOFOL  N/A 04/24/2022   Procedure: COLONOSCOPY WITH PROPOFOL ;  Surgeon: Elois Hair, MD;  Location: [facility] ENDOSCOPY;  Service: Gastroenterology;  Laterality: N/A;   DILATION AND EVACUATION N/A 10/22/2019   Procedure: ULTRASOUND GUIDED DILATATION AND EVACUATION;  Surgeon: Johnn Najjar, MD;  Location: [facility] LD ORS;  Service: Gynecology;  Laterality: N/A;   ESOPHAGOGASTRODUODENOSCOPY (EGD) WITH PROPOFOL  N/A 04/24/2022   Procedure: ESOPHAGOGASTRODUODENOSCOPY (EGD) WITH PROPOFOL ;  Surgeon: Elois Hair, MD;  Location: [facility] ENDOSCOPY;  Service: Gastroenterology;  Laterality: N/A;   IR FLUORO GUIDE CV LINE RIGHT   09/14/2022   IR US  GUIDE VASC ACCESS RIGHT  09/14/2022   peritoneal dialysis catheter insertion     RENAL BIOPSY      Social History   Socioeconomic History   Marital status: Single    Spouse name: Not on file   Number of children: 1   Years of education: Not on file   Highest education level: Not on file  Occupational History   Occupation: disabled  Tobacco Use   Smoking status: Former    Current packs/day: 1.00    Average packs/day: 1 pack/day for 2.0 years (2.0 ttl pk-yrs)    Types: Cigars, Cigarettes   Smokeless tobacco: Never  Vaping Use   Vaping status: Never Used  Substance and Sexual Activity   Alcohol use: No   Drug use: No   Sexual activity: Yes    Birth control/protection: None  Other Topics Concern   Not on file  Social History Narrative   Not on file   Social Drivers of Health   Financial Resource Strain: Low Risk  (02/06/2024)   Received [facility]   Overall Financial Resource Strain (CARDIA)    Difficulty of Paying Living Expenses: Not hard at all  Food Insecurity: No Food Insecurity (02/06/2024)   Received from [facility]   Hunger Vital Sign    Worried About Running Out of Food in the Last  Year: Never true    Ran Out of Food in the Last Year: Never true  Transportation Needs: No Transportation Needs (02/06/2024)   Received from [facility]   PRAPARE - Transportation    Lack of Transportation (Medical): No    Lack of Transportation (Non-Medical): No  Physical Activity: Insufficiently Active (02/06/2024)   Received from [facility]   Exercise Vital Sign    Days of Exercise per Week: 3 days    Minutes of Exercise per Session: 30 min  Stress: No Stress Concern Present (02/06/2024)   Received from [facility] - Occupational Stress Questionnaire    Feeling of Stress : Not at all  Social Connections: Socially Integrated (02/06/2024)   Received from [facility]   Social Network    How would  you rate your social network (family, work, friends)?: Good participation with social networks  Intimate Partner Violence: Unknown (08/03/2023)   Humiliation, Afraid, Rape, and Kick questionnaire    Fear of Current or Ex-Partner: Patient declined    Emotionally Abused: No    Physically Abused: No    Sexually Abused: No   *** Family History  Adopted: Yes  Problem Relation Age of Onset   Heart disease Neg Hx     Current Outpatient Medications  Medication Sig Dispense Refill   acetaminophen  (TYLENOL ) 500 MG tablet Take 1,000 mg by mouth daily as needed for headache (pain).     amitriptyline  (ELAVIL ) 50 MG tablet Take 1 tablet (50 mg total) by mouth at bedtime. 30 tablet 3   B Complex-C-Folic Acid  (RENA-VITE RX) 1 MG TABS Take 1 tablet by mouth daily.     calcium  acetate (PHOSLO ) 667 MG capsule Take 1,334 mg by mouth 3 (three) times daily with meals.     carvedilol  (COREG ) 6.25 MG tablet Take 1 tablet (6.25 mg total) by mouth 2 (two) times daily with a meal. 60 tablet 0   clonazePAM  (KLONOPIN ) 0.5 MG tablet Take 1 tablet (0.5 mg total) by mouth 2 (two) times daily as needed for anxiety. (Patient taking differently: Take 0.5 mg by mouth at bedtime.) 20 tablet 0   ELIQUIS  2.5 MG TABS tablet Take 2.5 mg by mouth 2 (two) times daily.     hydrOXYzine  (ATARAX ) 10 MG tablet Take 10 mg by mouth daily.     insulin  lispro (HUMALOG ) 100 UNIT/ML injection Inject 0.5 Units into the skin See admin instructions. 0.5 units per hour via insulin  pump.     isosorbide  mononitrate (IMDUR ) 30 MG 24 hr tablet Take 0.5 tablets (15 mg total) by mouth daily. 30 tablet 0   levETIRAcetam  (KEPPRA ) 250 MG tablet Take one tablet by mouth Tuesday, Thursday, and Saturday after you HD treatment 30 tablet 0   levETIRAcetam  (KEPPRA ) 500 MG tablet Take 1 tablet (500 mg total) by mouth daily at 6 (six) AM. 30 tablet 0   magnesium  oxide (MAG-OX) 400 MG tablet Take 2 tablets (800 mg total) by mouth daily. 60 tablet 6    methocarbamol  (ROBAXIN ) 500 MG tablet Take 500 mg by mouth as needed for muscle spasms.     metoCLOPramide  (REGLAN ) 10 MG tablet Take 1 tablet (10 mg total) by mouth every 6 (six) hours. 30 tablet 0   omeprazole (PRILOSEC) 20 MG capsule Take 20 mg by mouth daily.     SUMAtriptan  (IMITREX ) 50 MG tablet Take 50 mg by mouth daily as needed for headache.     UBRELVY  100 MG TABS Take  100 mg by mouth daily as needed (migraine).     valACYclovir  (VALTREX ) 500 MG tablet Take 500 mg by mouth daily as needed (cold sores).     VELPHORO  500 MG chewable tablet Chew 1,000 mg by mouth 3 (three) times daily with meals.     Vitamin D , Ergocalciferol , (DRISDOL ) 1.25 MG (50000 UNIT) CAPS capsule Take 50,000 Units by mouth every Sunday.     No current facility-administered medications for this visit.    Allergies  Allergen Reactions   Cantaloupe Extract Allergy Skin Test Itching and Other (See Comments)    Mouth itching     Citrullus Vulgaris Itching and Other (See Comments)    Makes mouth itch, ALL melons     Food Itching and Other (See Comments)    All melon - mouth itching   Strawberry Extract Itching and Other (See Comments)    Mouth itching   Nsaids Itching and Other (See Comments)    Avoid per nephrology     REVIEW OF SYSTEMS:  *** [X]  denotes positive finding, [ ]  denotes negative finding Cardiac  Comments:  Chest pain or chest pressure:    Shortness of breath upon exertion:    Short of breath when lying flat:    Irregular heart rhythm:        Vascular    Pain in calf, thigh, or hip brought on by ambulation:    Pain in feet at night that wakes you up from your sleep:     Blood clot in your veins:    Leg swelling:         Pulmonary    Oxygen at home:    Productive cough:     Wheezing:         Neurologic    Sudden weakness in arms or legs:     Sudden numbness in arms or legs:     Sudden onset of difficulty speaking or slurred speech:    Temporary loss of vision in one eye:      Problems with dizziness:         Gastrointestinal    Blood in stool:     Vomited blood:         Genitourinary    Burning when urinating:     Blood in urine:        Psychiatric    Major depression:         Hematologic    Bleeding problems:    Problems with blood clotting too easily:        Skin    Rashes or ulcers:        Constitutional    Fever or chills:      PHYSICAL EXAMINATION:  There were no vitals filed for this visit.  General:  WDWN in NAD; vital signs documented above Gait: Not observed HENT: WNL, normocephalic Pulmonary: normal non-labored breathing , without Rales, rhonchi,  wheezing Cardiac: {Desc; regular/irreg:14544} HR, without  Murmurs {With/Without:20273} carotid bruit*** Abdomen: soft, NT, no masses Skin: {With/Without:20273} rashes Vascular Exam/Pulses:  Right Left  Radial {Exam; arterial pulse strength 0-4:30167} {Exam; arterial pulse strength 0-4:30167}  Ulnar {Exam; arterial pulse strength 0-4:30167} {Exam; arterial pulse strength 0-4:30167}  Femoral {Exam; arterial pulse strength 0-4:30167} {Exam; arterial pulse strength 0-4:30167}  Popliteal {Exam; arterial pulse strength 0-4:30167} {Exam; arterial pulse strength 0-4:30167}  DP {Exam; arterial pulse strength 0-4:30167} {Exam; arterial pulse strength 0-4:30167}  PT {Exam; arterial pulse strength 0-4:30167} {Exam; arterial pulse strength 0-4:30167}   Extremities: {With/Without:20273} ischemic  changes, {With/Without:20273} Gangrene , {With/Without:20273} cellulitis; {With/Without:20273} open wounds;  Musculoskeletal: no muscle wasting or atrophy  Neurologic: A&O X 3;  No focal weakness or paresthesias are detected Psychiatric:  The pt has {Desc; normal/abnormal:11317::Normal} affect.   Non-Invasive Vascular Imaging:   ***    ASSESSMENT/PLAN:  Rolando Whitby is a 28 y.o. female who presents with {KidneyDisease:19197::end stage renal disease,chronic kidney disease stage ***}  Based  on vein mapping and examination, ***. I had an extensive discussion with this patient in regards to the nature of access surgery, including risk, benefits, and alternatives.   The patient is aware that the risks of access surgery include but are not limited to: bleeding, infection, steal syndrome, nerve damage, ischemic monomelic neuropathy, failure of access to mature, complications related to venous hypertension, and possible need for additional access procedures in the future. *** I discussed with the patient the nature of the staged access procedure, specifically the need for a second operation to transpose the first stage fistula if it matures adequately.   The patient has *** agreed to proceed with the above procedure which will be scheduled ***.  Kayla Part, MD [facility] [phone]

## 2024-02-07 ENCOUNTER — Ambulatory Visit (HOSPITAL_COMMUNITY): Attending: Nephrology

## 2024-02-07 ENCOUNTER — Ambulatory Visit: Admitting: Vascular Surgery

## 2024-02-07 ENCOUNTER — Ambulatory Visit (HOSPITAL_COMMUNITY): Admission: RE | Admit: 2024-02-07 | Source: Ambulatory Visit

## 2024-02-15 ENCOUNTER — Ambulatory Visit (HOSPITAL_COMMUNITY)

## 2024-02-26 NOTE — Progress Notes (Signed)
 Patient: Kathy Lewis, 1995-09-26, 28y, F  Dialysis Location: [facility]  Attending Nephrologist: Damien Repress  Service Date: 02/26/2024  Service Provider: Damien Repress, MD  I met face to face with the patient today.  OVERVIEW  The patient presented with ESRD on dialysis  Primary cause of renal failure: Type 1 diabetes mellitus with diabetic chronic kidney disease  Comments: Pt has no complaints today. Pt has good appetite. Pt has been on HD for 2 years. Denies n/v/d. Denies SOB/CP. No LLE noted.   Medications and labs reviewed.  LAST HOSPITALIZATION  Discharge Diagnosis: R56.9 Unspecified convulsions  Admission Date 01/26/24  Discharge Date 01/31/24  DIALYSIS PRESCRIPTION    IHD 3x Week Start date: 02/26/24    Dialyzer: 180NRe Optiflux    BFR: 400    DFR: Manual 600    Potassium: 2.0    Sodium: 137    EDW: 60    Duration: 4:00    Calcium : 2.0    Bicarb: 35    Rx updated on: 02/26/2024  TREATMENT ASSESSMENT  BP Stand Pre    02/23/2024: 141/93    02/21/2024: 131/98    02/19/2024: 133/96  BP Sit Pre    02/23/2024: 156/105    02/21/2024: 127/90    02/19/2024: 144/99  BP Stand Post    02/23/2024: 108/92    02/21/2024: 130/92    02/19/2024: 106/69  BP Sit Post    02/23/2024: 119/75    02/21/2024: 130/87    02/19/2024: 110/72  Tx Duration    02/23/2024: 4:05    02/21/2024: 3:56    02/19/2024: 3:59  Missed Treatments  0 - last 30 days  0 - last 60 days  FLUID ASSESSMENT  EDW (kg)    02/23/2024: 60.0    02/21/2024: 60.0    02/19/2024: 60.0  Weight Pre (kg)    02/23/2024: 64.9    02/21/2024: 65.4    02/19/2024: 65.0  Weight Post (kg)    02/23/2024: 61.3    02/21/2024: 62.7    02/19/2024: 61.9  PWV (kg)    02/23/2024: 1.3    02/21/2024: 2.7    02/19/2024: 1.9  UF Rate (mL/kg/hr)    02/23/2024: 14.4    02/21/2024: 10.9    02/19/2024: 12.6  ADEQUACY ASSESSMENT  spKt/V, URR    02/02/2024: 2.18,  83.0    01/03/2024: 2.11, 82.0    12/01/2023: 2.07, 80.0  ACCESS ASSESSMENT    Access Type: CVCatheter    Access SubType: Tunneled    Access Status: Active (In Use) - 03/20/2023    Access Location: Chest    Placed: 09/16/2022  Comments: Pt has no appt for AV access placement.  ANEMIA ASSESSMENT  HGB, TSAT    02/21/2024: 10.9, -    02/14/2024: 13.2, 59.0    02/07/2024: 10.8, -      Ferritin    02/14/2024: 754.0    01/17/2024: 599.0    12/13/2023: 686.0  BMM ASSESSMENT  PTH, Intact    02/14/2024: 753.0    01/17/2024: 505.0    12/13/2023: 832.0      Calcium , Phosphorus    02/14/2024: 9.0, 10.1    02/07/2024: -, 8.1    01/17/2024: 8.7, 11.8  Doxercalciferol (Hectorol) (mcg)    02/23/2024: 8    02/21/2024: 8    02/19/2024: 8  NUTRITION ASSESSMENT  Potassium, Albumin     02/21/2024: 5.4, -    02/14/2024: 5.8, 4.0    02/07/2024: 5.7, -      eNPCR    01/03/2024: 1.32  12/01/2023: 1.02    11/01/2023: 1.14  DIAGNOSIS  Chief Complaint: N18.6 End stage renal disease    Patient data updated 02/26/2024 at 1:43 PM  Signed By: Austin Perkins, MD  on 02/26/2024 1:45:34 PM

## 2024-03-19 NOTE — Progress Notes (Signed)
 Orders placed to store outside images

## 2024-03-31 ENCOUNTER — Telehealth (HOSPITAL_COMMUNITY): Payer: Self-pay

## 2024-03-31 NOTE — Telephone Encounter (Signed)
 Called to confirm/remind patient of their appointment at [facility] on 04/01/24. However, pt needed  to rescheduled for 04/14/24.

## 2024-04-01 ENCOUNTER — Encounter (HOSPITAL_COMMUNITY)

## 2024-04-11 ENCOUNTER — Telehealth (HOSPITAL_COMMUNITY): Payer: Self-pay

## 2024-04-11 NOTE — Telephone Encounter (Signed)
 Called to confirm/remind patient of their appointment at [facility] on 04/14/24. However, pt needed to reschedule her appointment. Patient has been scheduled for a upcoming appointment for a later date.

## 2024-04-11 NOTE — Progress Notes (Incomplete)
 ADVANCED HF CLINIC NOTE   PCP: Lesa Jon HERO, PA Primary Cardiologist: Dr. Cherrie  Chief complaint: Heart failure follow up  HPI: Kathy Lewis is a 28 y.o. 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 Has been followed at [facility] AHF team but work-up complicated by lack of f/o and compliance issues.    Admitted to [facility] and underwent RHC with markedly elevated filling pressures and low output. Started on inotropes. Also underwent CVVHD for several days but refused conversion to PD. Left AMA.   Echo 3/23 EF 20% RV moderately reduced   Admitted 1/24 for HF decompensation. PD not working. Switched to HD.  I saw her in clinic 1/24 and referred to [facility] for heart/kidney/pancreas transplant eval. Cancelled appointment due to COVID  Admitted 10/04/22 with intractable n/v and HTN urgency. Head CT with small chronic appearing right superior cerebellar infarct. No acute changes. Developed hypoglycemia and persistent uncontrolled hypertension. Admitted to the ICU for clevidipine  infusion. Oral meds adjusted  Echo 10/05/22 EF < 20% RV moderately reduced. Mild MR  Seen in ED 10/22/22 with COVID. Left AMA  Returned to ED 11/14/22 with abdominal pain. Given IV fluids, reglan , and dilaudid . She left ED.   (Had normal gastric emptying study in 6/23)  Was seen at [facility] for heart-kidney transplant evaluation. CPX 4/24. pVO2 10.7 (34%) VE/VCO2 36 RER 0.92. Has been deferred for now until she can prove better compliance.  Admitted 6/24 with N/V.   Echo 05/07/23: EF 20-25%   She's had multiple recent ED visits and admissions for abdominal pain, nausea and vomiting. Readmitted with abdominal pain. GI previously felt may be cyclical vomiting syndrome +/- abdominal congestion from CHF. Entresto  held with hyperkalemia. Planned to to lower dry weight as tolerated.  Admitted 12/24 with UTI and N/V. Coreg  reduced and hydralazine  was  stopped.  Seen in ED 12/17/23 with nausea after HD had been cut short. K was > 7.5, received emergent HD and given lokelma , IV lasix  and IV calcium .   Here today for CHF follow-up. She was seen by Advanced Heart Failure at [facility] last week and is planning to undergo another transplant evaluation now that she has demonstrated better compliance. Overall has been doing well. Notes her blood pressure may drop to 70s systolic during HD. Sometimes sessions must be stopped earlier and get gets fluid back. Does not hold any blood pressure medications day of HD. On nonHD days her blood pressure runs 120s-130s/90s-100s. Denies dyspnea, orthopnea, PND or lower extremity edema. Continues with chronic fatigue.  Her father helps with caring for her young daughter.  ROS: All systems negative except as listed in HPI, PMH and Problem List.  SH:  Social History   Socioeconomic History   Marital status: Single    Spouse name: Not on file   Number of children: 1   Years of education: Not on file   Highest education level: Not on file  Occupational History   Occupation: disabled  Tobacco Use   Smoking status: Former    Current packs/day: 1.00    Average packs/day: 1 pack/day for 2.0 years (2.0 ttl pk-yrs)    Types: Cigars, Cigarettes   Smokeless tobacco: Never  Vaping Use   Vaping status: Never Used  Substance and Sexual Activity   Alcohol use: No   Drug use: No   Sexual activity: Yes    Birth control/protection: None  Other Topics Concern   Not on file  Social History Narrative   Not on file   Social Drivers of Health   Financial Resource Strain: Low Risk  (02/06/2024)   Received from [facility]   Overall Financial Resource Strain (CARDIA)    Difficulty of Paying Living Expenses: Not hard at all  Food Insecurity: No Food Insecurity (02/06/2024)   Received from [facility]   Hunger Vital Sign    Within the past 12 months, you worried that your food would run out before you got the money  to buy more.: Never true    Within the past 12 months, the food you bought just didn't last and you didn't have money to get more.: Never true  Transportation Needs: No Transportation Needs (02/06/2024)   Received from [facility] - Transportation    Lack of Transportation (Medical): No    Lack of Transportation (Non-Medical): No  Physical Activity: Insufficiently Active (02/06/2024)   Received from [facility]   Exercise Vital Sign    On average, how many days per week do you engage in moderate to strenuous exercise (like a brisk walk)?: 3 days    On average, how many minutes do you engage in exercise at this level?: 30 min  Stress: No Stress Concern Present (02/06/2024)   Received from [facility] - Occupational Stress Questionnaire    Feeling of Stress : Not at all  Social Connections: Socially Integrated (02/06/2024)   Received from [facility]   Social Network    How would you rate your social network (family, work, friends)?: Good participation with social networks  Intimate Partner Violence: Unknown (08/03/2023)   Humiliation, Afraid, Rape, and Kick questionnaire    Fear of Current or Ex-Partner: Patient declined    Emotionally Abused: No    Physically Abused: No    Sexually Abused: No    FH:  Family History  Adopted: Yes  Problem Relation Age of Onset   Heart disease Neg Hx     Past Medical History:  Diagnosis Date   Anemia    Anxiety    Bipolar 2 disorder (HCC)    Chronic systolic (congestive) heart failure (HCC)    Depression    DKA (diabetic ketoacidoses)    ESRD on hemodialysis (HCC)    TTS at [facility]   HSV infection    on valtrex    Hypokalemia    Leukocytosis    Migraine    Noncompliance with medication regimen    Preeclampsia    Prolonged QT syndrome    Severe anemia    Type 1 diabetes mellitus (HCC)     Current Outpatient Medications  Medication Sig Dispense Refill   acetaminophen   (TYLENOL ) 500 MG tablet Take 1,000 mg by mouth daily as needed for headache (pain).     amitriptyline  (ELAVIL ) 50 MG tablet Take 1 tablet (50 mg total) by mouth at bedtime. 30 tablet 3   B Complex-C-Folic Acid  (RENA-VITE RX) 1 MG TABS Take 1 tablet by mouth daily.     calcium  acetate (PHOSLO ) 667 MG capsule Take 1,334 mg by mouth 3 (three) times daily with meals.     carvedilol  (COREG ) 6.25 MG tablet Take 1 tablet (6.25 mg total) by mouth 2 (two) times daily with a meal. 60 tablet 0   clonazePAM  (KLONOPIN ) 0.5 MG tablet Take 1 tablet (0.5 mg total) by mouth 2 (two) times daily as needed for anxiety. (Patient taking differently: Take 0.5 mg by mouth at bedtime.)  20 tablet 0   ELIQUIS  2.5 MG TABS tablet Take 2.5 mg by mouth 2 (two) times daily.     hydrOXYzine  (ATARAX ) 10 MG tablet Take 10 mg by mouth daily.     insulin  lispro (HUMALOG ) 100 UNIT/ML injection Inject 0.5 Units into the skin See admin instructions. 0.5 units per hour via insulin  pump.     isosorbide  mononitrate (IMDUR ) 30 MG 24 hr tablet Take 0.5 tablets (15 mg total) by mouth daily. 30 tablet 0   levETIRAcetam  (KEPPRA ) 250 MG tablet Take one tablet by mouth Tuesday, Thursday, and Saturday after you HD treatment 30 tablet 0   levETIRAcetam  (KEPPRA ) 500 MG tablet Take 1 tablet (500 mg total) by mouth daily at 6 (six) AM. 30 tablet 0   magnesium  oxide (MAG-OX) 400 MG tablet Take 2 tablets (800 mg total) by mouth daily. 60 tablet 6   methocarbamol  (ROBAXIN ) 500 MG tablet Take 500 mg by mouth as needed for muscle spasms.     metoCLOPramide  (REGLAN ) 10 MG tablet Take 1 tablet (10 mg total) by mouth every 6 (six) hours. 30 tablet 0   omeprazole (PRILOSEC) 20 MG capsule Take 20 mg by mouth daily.     SUMAtriptan  (IMITREX ) 50 MG tablet Take 50 mg by mouth daily as needed for headache.     UBRELVY  100 MG TABS Take 100 mg by mouth daily as needed (migraine).     valACYclovir  (VALTREX ) 500 MG tablet Take 500 mg by mouth daily as needed (cold  sores).     VELPHORO  500 MG chewable tablet Chew 1,000 mg by mouth 3 (three) times daily with meals.     Vitamin D , Ergocalciferol , (DRISDOL ) 1.25 MG (50000 UNIT) CAPS capsule Take 50,000 Units by mouth every Sunday.     No current facility-administered medications for this visit.    There were no vitals filed for this visit.    Wt Readings from Last 3 Encounters:  01/30/24 60.3 kg (132 lb 15 oz)  12/31/23 63.3 kg (139 lb 9.6 oz)  12/18/23 68.5 kg (151 lb 0.2 oz)    PHYSICAL EXAM: General:  Well appearing Neck: no JVD.  Cor: Regular rate & rhythm. No rubs, gallops or murmurs. Lungs: clear Abdomen: soft, nontender, nondistended.  Extremities: trace edema Neuro: alert & orientedx3. Affect pleasant    ASSESSMENT & PLAN: 1. Chronic systolic heart failure - Hx Peripartum CM 03/2020. RHC 07/29/20:  CO low-normal by TD (CO/CI) Thermodilution: 4.07/2.59) and normal cardiac output by Fick (CO/CI Fick: 4.7/3.0) ECHOs 21' suggested evidence of possible non-compaction, but cMRI 1' showed no evidence of infarction, myocarditis, or infiltrative patterns. TTE on 07/30/20 with LVEF 35-40% but declined to 25-30% around 04/2021 likely in the setting of noncompliance with medical therapies.  - Echo 10/2021: EF of 20%. Small pericardial effusion. Indeterminate diastolic function   - Echo 08/14/22: EF 13%, mod MR, severe TR, PASP ~50, small effusion - RHC at [facility] 12/23: RA mean, PA mean 56, PCWP 39, PA sat 56%, Fick CI 1.7, Thermo CI 1.9 - Echo 10/05/22 EF < 20% RV moderately reduced. Mild MR - CPX 4/24. pVO2 10.7 (34%) VE/VCO2 36 RER 0.92. - Echo 05/07/23: EF 20-25%  - Echo 10/24: EF 20-25%, RVSP 48 mmHg, moderate to severe TR - Previously followed by AHF team at [facility] but has switched care here - Improved NYHA II. Volume looks good today - Unclear what doses of carvedilol , hydralazine  and imdur  she is taking. Med lists here and at [facility] are different. She  will all us  when she gets home to confirm  medications. Cannot restart Entresto  d/t recent hyperkalemia. - Has been seen at [facility] for heart-kidney transplant in 4/24. She has been turned down d/t noncompliance. She is now doing better with this and has good support from family. Just saw Dr. Tobie at [facility] and planning repeat evaluation for transplant.  - Repeat echo  2. End stage renal disease  - Volume status much improved on HD - Some low BP post HD, will likely need to hold some meds on HD days once med list confirmed. HD intermittently stopped early d/t hypotension. - Planning transplant workup at [facility] as above  3. DMI - Follows with Endocrine - Hgb A1c 8.4% 10/24  4. HTN - BP controlled in clinic but has been elevated at home on nonHD days.  - See discussion above regarding meds  5. N/V - suspect DM gastroparesis but emyptying study ok in 6/23 - RHC did not support low output state - GI suspected possible cyclical vomiting syndrome  6. RIJ DVT - on Eliquis  2.5 bid  7. RA density -Linear mobile density noted in RA on echoes in 10/24, 09/24 and 02/24. Possible thrombin sheath from prior line insertion. No evidence of endocarditis.  8. Hyperkalemia - K recently > 7 in 04/25. Had emergent HD. Reports that she had labs with Nephrology at [facility] after this and K remained elevated - Will hold off on restarting Entresto   Follow-up 3 months   Kathy CHRISTELLA Gainer, FNP  12:19 PM

## 2024-04-14 ENCOUNTER — Encounter (HOSPITAL_COMMUNITY)

## 2024-04-15 ENCOUNTER — Telehealth (HOSPITAL_COMMUNITY): Payer: Self-pay

## 2024-04-15 NOTE — Progress Notes (Signed)
 CSW left voicemail for Darian's mother Carlette Palmatier [phone], identified secondary caregiver) to attempt to confirm care plan. CSW requested return call.   Estefana Gens, MSW, LCSW, CCTSW-MCS Licensed Visual merchandiser

## 2024-04-15 NOTE — Telephone Encounter (Signed)
 Called to confirm/remind patient of their appointment at the [facility] on 04/16/24.   Appointment:   [] Confirmed  [x] Left mess   [] No answer/No voice mail  [] VM Full/unable to leave message  [] Phone not in service  And to bring in all medications and/or complete list.

## 2024-04-16 ENCOUNTER — Encounter (HOSPITAL_COMMUNITY): Payer: Self-pay

## 2024-04-16 ENCOUNTER — Ambulatory Visit (HOSPITAL_COMMUNITY)
Admission: RE | Admit: 2024-04-16 | Discharge: 2024-04-16 | Disposition: A | Discharge status: Home / Self Care | Source: Ambulatory Visit | Attending: Family Medicine | Admitting: Family Medicine

## 2024-04-16 VITALS — BP 122/78 | HR 73 | Ht 60.0 in | Wt 141.8 lb

## 2024-04-16 DIAGNOSIS — N186 End stage renal disease: Secondary | ICD-10-CM | POA: Insufficient documentation

## 2024-04-16 DIAGNOSIS — E1022 Type 1 diabetes mellitus with diabetic chronic kidney disease: Secondary | ICD-10-CM | POA: Insufficient documentation

## 2024-04-16 DIAGNOSIS — Z794 Long term (current) use of insulin: Secondary | ICD-10-CM | POA: Diagnosis not present

## 2024-04-16 DIAGNOSIS — I10 Essential (primary) hypertension: Secondary | ICD-10-CM | POA: Diagnosis not present

## 2024-04-16 DIAGNOSIS — I82C11 Acute embolism and thrombosis of right internal jugular vein: Secondary | ICD-10-CM | POA: Insufficient documentation

## 2024-04-16 DIAGNOSIS — I5022 Chronic systolic (congestive) heart failure: Secondary | ICD-10-CM | POA: Diagnosis not present

## 2024-04-16 DIAGNOSIS — Z79899 Other long term (current) drug therapy: Secondary | ICD-10-CM | POA: Diagnosis not present

## 2024-04-16 DIAGNOSIS — Z7901 Long term (current) use of anticoagulants: Secondary | ICD-10-CM | POA: Insufficient documentation

## 2024-04-16 DIAGNOSIS — E1065 Type 1 diabetes mellitus with hyperglycemia: Secondary | ICD-10-CM | POA: Diagnosis not present

## 2024-04-16 DIAGNOSIS — Z9641 Presence of insulin pump (external) (internal): Secondary | ICD-10-CM | POA: Diagnosis not present

## 2024-04-16 DIAGNOSIS — Z8759 Personal history of other complications of pregnancy, childbirth and the puerperium: Secondary | ICD-10-CM | POA: Insufficient documentation

## 2024-04-16 DIAGNOSIS — I3139 Other pericardial effusion (noninflammatory): Secondary | ICD-10-CM | POA: Diagnosis not present

## 2024-04-16 DIAGNOSIS — Z87891 Personal history of nicotine dependence: Secondary | ICD-10-CM | POA: Insufficient documentation

## 2024-04-16 DIAGNOSIS — I132 Hypertensive heart and chronic kidney disease with heart failure and with stage 5 chronic kidney disease, or end stage renal disease: Secondary | ICD-10-CM | POA: Insufficient documentation

## 2024-04-16 DIAGNOSIS — E10649 Type 1 diabetes mellitus with hypoglycemia without coma: Secondary | ICD-10-CM | POA: Insufficient documentation

## 2024-04-16 DIAGNOSIS — E1043 Type 1 diabetes mellitus with diabetic autonomic (poly)neuropathy: Secondary | ICD-10-CM | POA: Diagnosis not present

## 2024-04-16 DIAGNOSIS — I8290 Acute embolism and thrombosis of unspecified vein: Secondary | ICD-10-CM

## 2024-04-16 DIAGNOSIS — Z992 Dependence on renal dialysis: Secondary | ICD-10-CM | POA: Insufficient documentation

## 2024-04-16 DIAGNOSIS — Q209 Congenital malformation of cardiac chambers and connections, unspecified: Secondary | ICD-10-CM

## 2024-04-16 DIAGNOSIS — I120 Hypertensive chronic kidney disease with stage 5 chronic kidney disease or end stage renal disease: Secondary | ICD-10-CM | POA: Insufficient documentation

## 2024-04-16 DIAGNOSIS — I160 Hypertensive urgency: Secondary | ICD-10-CM | POA: Insufficient documentation

## 2024-04-16 MED ORDER — ISOSORBIDE MONONITRATE ER 30 MG PO TB24
15.0000 mg | ORAL_TABLET | Freq: Every day | ORAL | 3 refills | Status: AC
Start: 2024-04-16 — End: ?

## 2024-04-16 MED ORDER — CARVEDILOL 6.25 MG PO TABS
6.2500 mg | ORAL_TABLET | Freq: Two times a day (BID) | ORAL | 3 refills | Status: AC
Start: 2024-04-16 — End: ?

## 2024-04-16 NOTE — Patient Instructions (Signed)
 No change in medications.  Referral sent for Cardiac Rehab - see below. They should contact you to schedule your first appointment, if you don't hear from them, please call phone number below. Refills sent on Imdur  and Coreg . Return to see Dr. Cherrie in 3 months. PLEASE CALL US  AT [phone] IN OCTOBER TO SCHEDULE THIS APPOINTMENT.  Please call us  at [phone] if any questions or concerns prior to your next appointment.

## 2024-04-16 NOTE — Progress Notes (Signed)
 ADVANCED HF CLINIC NOTE   PCP: Lesa Jon HERO, PA HF Cardiologist: Dr. Cherrie  HPI: Kathy Lewis is a 28 y.o. 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 Has been followed at [facility] AHF team but work-up complicated by lack of f/o and compliance issues.    Admitted to [facility] and underwent RHC with markedly elevated filling pressures and low output. Started on inotropes. Also underwent CVVHD for several days but refused conversion to PD. Left AMA.   Echo 3/23 EF 20% RV moderately reduced   Admitted 1/24 for HF decompensation. PD not working. Switched to HD.  I saw her in clinic 1/24 and referred to [facility] for heart/kidney/pancreas transplant eval. Cancelled appointment due to COVID  Admitted 10/04/22 with intractable n/v and HTN urgency. Head CT with small chronic appearing right superior cerebellar infarct. No acute changes. Developed hypoglycemia and persistent uncontrolled hypertension. Admitted to the ICU for clevidipine  infusion. Oral meds adjusted  Echo 10/05/22 EF < 20% RV moderately reduced. Mild MR  Seen in ED 10/22/22 with COVID. Left AMA  Returned to ED 11/14/22 with abdominal pain. Given IV fluids, reglan , and dilaudid . She left ED.   (Had normal gastric emptying study in 6/23)  Was seen at [facility] for heart-kidney transplant evaluation. CPX 4/24. pVO2 10.7 (34%) VE/VCO2 36 RER 0.92. Has been deferred for now until she can prove better compliance.  Admitted 6/24 with N/V.   Echo 05/07/23: EF 20-25%   She's had multiple recent ED visits and admissions for abdominal pain, nausea and vomiting. Readmitted with abdominal pain. GI previously felt may be cyclical vomiting syndrome +/- abdominal congestion from CHF. Entresto  held with hyperkalemia. Planned to to lower dry weight as tolerated.  Admitted 12/24 with UTI and N/V. Coreg  reduced and hydralazine  was stopped.  CPX 4/24 showed   Seen in ED 12/17/23 with nausea  after HD had been cut short. K was > 7.5, received emergent HD and given lokelma , IV lasix  and IV calcium .   Echo 8/25 EF 25%, RV ok  RHC 8/25 at [facility] for heart/kidney transplant eval, showed mildly elevated PA pressures with preserved CO; RA 7, PA 55/28 (39), PWCP 27, CO/CI (Fick) 4.8/3.1, PVR 2.5 WU  Today she returns for HF follow up. Overall feeling fine. She has SOB walking up steps. Rare atypical CP. NO issues at HD, sometimes BP drops and gets fluid back. Denies  palpitations, abnormal bleeding, dizziness, edema, or PND/Orthopnea. Appetite ok. Dry weight 61 kg. Dialysis is T/Th/Sat. Taking all medications. No longer on AC. Approved for heart at [facility], awaiting to hear about kidney. Daughter is 52 years old, [name]'s parents help with childcare.   ROS: All systems negative except as listed in HPI, PMH and Problem List.  SH:  Social History   Socioeconomic History   Marital status: Single    Spouse name: Not on file   Number of children: 1   Years of education: Not on file   Highest education level: Not on file  Occupational History   Occupation: disabled  Tobacco Use   Smoking status: Former    Current packs/day: 1.00    Average packs/day: 1 pack/day for 2.0 years (2.0 ttl pk-yrs)    Types: Cigars, Cigarettes   Smokeless tobacco: Never  Vaping Use   Vaping status: Never Used  Substance and Sexual Activity   Alcohol use: No   Drug use: No   Sexual activity: Yes  Birth control/protection: None  Other Topics Concern   Not on file  Social History Narrative   Not on file   Social Drivers of Health   Financial Resource Strain: Low Risk  (02/06/2024)   Received from [facility]   Overall Financial Resource Strain (CARDIA)    Difficulty of Paying Living Expenses: Not hard at all  Food Insecurity: No Food Insecurity (02/06/2024)   Received from [facility]   Hunger Vital Sign    Within the past 12 months, you worried that your food would run out before you got the  money to buy more.: Never true    Within the past 12 months, the food you bought just didn't last and you didn't have money to get more.: Never true  Transportation Needs: No Transportation Needs (02/06/2024)   Received from [facility] - Transportation    Lack of Transportation (Medical): No    Lack of Transportation (Non-Medical): No  Physical Activity: Insufficiently Active (02/06/2024)   Received from [facility]   Exercise Vital Sign    On average, how many days per week do you engage in moderate to strenuous exercise (like a brisk walk)?: 3 days    On average, how many minutes do you engage in exercise at this level?: 30 min  Stress: No Stress Concern Present (02/06/2024)   Received from [facility] - Occupational Stress Questionnaire    Feeling of Stress : Not at all  Social Connections: Socially Integrated (02/06/2024)   Received from [facility]   Social Network    How would you rate your social network (family, work, friends)?: Good participation with social networks  Intimate Partner Violence: Unknown (08/03/2023)   Humiliation, Afraid, Rape, and Kick questionnaire    Fear of Current or Ex-Partner: Patient declined    Emotionally Abused: No    Physically Abused: No    Sexually Abused: No    FH:  Family History  Adopted: Yes  Problem Relation Age of Onset   Heart disease Neg Hx     Past Medical History:  Diagnosis Date   Anemia    Anxiety    Bipolar 2 disorder (HCC)    Chronic systolic (congestive) heart failure (HCC)    Depression    DKA (diabetic ketoacidoses)    ESRD on hemodialysis (HCC)    TTS at [facility]   HSV infection    on valtrex    Hypokalemia    Leukocytosis    Migraine    Noncompliance with medication regimen    Preeclampsia    Prolonged QT syndrome    Severe anemia    Type 1 diabetes mellitus (HCC)     Current Outpatient Medications  Medication Sig Dispense Refill    acetaminophen  (TYLENOL ) 500 MG tablet Take 1,000 mg by mouth daily as needed for headache (pain).     amitriptyline  (ELAVIL ) 50 MG tablet Take 1 tablet (50 mg total) by mouth at bedtime. 30 tablet 3   B Complex-C-Folic Acid  (RENA-VITE RX) 1 MG TABS Take 1 tablet by mouth daily.     calcium  acetate (PHOSLO ) 667 MG capsule Take 1,334 mg by mouth 3 (three) times daily with meals.     carvedilol  (COREG ) 6.25 MG tablet Take 1 tablet (6.25 mg total) by mouth 2 (two) times daily with a meal. 60 tablet 0   clonazePAM  (KLONOPIN ) 0.5 MG tablet Take 1 tablet (0.5 mg total) by mouth 2 (two) times daily as  needed for anxiety. (Patient taking differently: Take 0.5 mg by mouth at bedtime.) 20 tablet 0   hydrOXYzine  (ATARAX ) 10 MG tablet Take 10 mg by mouth daily.     insulin  lispro (HUMALOG ) 100 UNIT/ML injection Inject 0.5 Units into the skin See admin instructions. 0.5 units per hour via insulin  pump.     isosorbide  mononitrate (IMDUR ) 30 MG 24 hr tablet Take 0.5 tablets (15 mg total) by mouth daily. 30 tablet 0   magnesium  oxide (MAG-OX) 400 MG tablet Take 2 tablets (800 mg total) by mouth daily. 60 tablet 6   methocarbamol  (ROBAXIN ) 500 MG tablet Take 500 mg by mouth as needed for muscle spasms.     omeprazole (PRILOSEC) 20 MG capsule Take 20 mg by mouth daily.     SUMAtriptan  (IMITREX ) 50 MG tablet Take 50 mg by mouth daily as needed for headache.     UBRELVY  100 MG TABS Take 100 mg by mouth daily as needed (migraine).     valACYclovir  (VALTREX ) 500 MG tablet Take 500 mg by mouth daily as needed (cold sores).     VELPHORO  500 MG chewable tablet Chew 1,000 mg by mouth 3 (three) times daily with meals.     Vitamin D , Ergocalciferol , (DRISDOL ) 1.25 MG (50000 UNIT) CAPS capsule Take 50,000 Units by mouth every Sunday.     ELIQUIS  2.5 MG TABS tablet Take 2.5 mg by mouth 2 (two) times daily. (Patient not taking: Reported on 04/16/2024)     levETIRAcetam  (KEPPRA ) 250 MG tablet Take one tablet by mouth Tuesday,  Thursday, and Saturday after you HD treatment (Patient not taking: Reported on 04/16/2024) 30 tablet 0   levETIRAcetam  (KEPPRA ) 500 MG tablet Take 1 tablet (500 mg total) by mouth daily at 6 (six) AM. (Patient not taking: Reported on 04/16/2024) 30 tablet 0   metoCLOPramide  (REGLAN ) 10 MG tablet Take 1 tablet (10 mg total) by mouth every 6 (six) hours. (Patient not taking: Reported on 04/16/2024) 30 tablet 0   No current facility-administered medications for this encounter.    Wt Readings from Last 3 Encounters:  04/16/24 64.3 kg (141 lb 12.8 oz)  01/30/24 60.3 kg (132 lb 15 oz)  12/31/23 63.3 kg (139 lb 9.6 oz)   BP 122/78   Pulse 73   Ht 5' (1.524 m)   Wt 64.3 kg (141 lb 12.8 oz)   SpO2 98%   BMI 27.69 kg/m   PHYSICAL EXAM: General:  NAD. No resp difficulty, walked into clinic HEENT: Normal Neck: Supple. No JVD. Cor: Regular rate & rhythm. No rubs, gallops or murmurs. Lungs: Clear, TDC in chest Abdomen: Soft, nontender, nondistended.  Extremities: No cyanosis, clubbing, rash, edema Neuro: Alert & oriented x 3, moves all 4 extremities w/o difficulty. Affect pleasant.  ASSESSMENT & PLAN: 1. Chronic systolic heart failure - Hx Peripartum CM 03/2020. RHC 07/29/20:  CO low-normal by TD (CO/CI) Thermodilution: 4.07/2.59) and normal cardiac output by Fick (CO/CI Fick: 4.7/3.0) ECHOs 21' suggested evidence of possible non-compaction, but cMRI 53' showed no evidence of infarction, myocarditis, or infiltrative patterns. TTE on 07/30/20 with LVEF 35-40% but declined to 25-30% around 04/2021 likely in the setting of noncompliance with medical therapies.  - Echo 10/2021: EF of 20%. Small pericardial effusion. Indeterminate diastolic function   - Echo 08/14/22: EF 13%, mod MR, severe TR, PASP ~50, small effusion - RHC at [facility] 12/23: RA mean, PA mean 56, PCWP 39, PA sat 56%, Fick CI 1.7, Thermo CI 1.9 - Echo 10/05/22 EF <  20% RV moderately reduced. Mild MR - CPX 4/24. pVO2 10.7 (34%) VE/VCO2 36 RER  0.92. - Echo 05/07/23: EF 20-25%  - Echo 10/24: EF 20-25%, RVSP 48 mmHg, moderate to severe TR - Previously followed by AHF team at [facility] but has switched care here - Echo 8/25 ([facility]): EF 25%, RV ok - RHC 8/25 ([facility]): RA 7, PA 55/28 (39), PWCP 27, CO/CI (Fick) 4.8/3.1, PVR 2.5 WU - NYHA II, suspect physical deconditioning contributing. Volume looks good today, volume per iHD - Continue Coreg  6.25 mg bid - Continue Imdur  15 mg daily - Approved [facility] heart at [facility] awaiting kidney - I think cardiac rehab would be beneficial, discussed this today. She agrees, will refer.   2. End stage renal disease  - Volume managed by iHD. - Continue to hold BP active meds before iHD - Awaiting approval for kidney at [facility]  3. DMI - Follows with Endocrine - Hgb A1c down to 7 - On insulin  pump  4. HTN - BP controlled  - Meds as above  5. N/V - suspect DM gastroparesis but emyptying study ok in 6/23 - RHC did not support low output state - GI suspected possible cyclical vomiting syndrome - Resolved  6. RIJ DVT - Previously treated with Eliquis , now off  7. RA density - Linear mobile density noted in RA on echoes in 10/24, 09/24 and 02/24.  - Possible thrombin sheath from prior line insertion. No evidence of endocarditis.  Follow up in 3 months with Dr. Bensimhon  Jakwan Sally M Laural Eiland, FNP  8:58 AM

## 2024-05-12 ENCOUNTER — Other Ambulatory Visit: Payer: Self-pay

## 2024-05-12 DIAGNOSIS — N186 End stage renal disease: Secondary | ICD-10-CM

## 2024-06-11 ENCOUNTER — Encounter: Payer: Self-pay | Admitting: Vascular Surgery

## 2024-06-11 ENCOUNTER — Ambulatory Visit (HOSPITAL_COMMUNITY)
Admission: RE | Admit: 2024-06-11 | Discharge: 2024-06-11 | Disposition: A | Discharge status: Home / Self Care | Source: Ambulatory Visit | Attending: Vascular Surgery | Admitting: Vascular Surgery

## 2024-06-11 ENCOUNTER — Ambulatory Visit (INDEPENDENT_AMBULATORY_CARE_PROVIDER_SITE_OTHER): Admitting: Vascular Surgery

## 2024-06-11 ENCOUNTER — Ambulatory Visit (HOSPITAL_BASED_OUTPATIENT_CLINIC_OR_DEPARTMENT_OTHER)
Admission: RE | Admit: 2024-06-11 | Discharge: 2024-06-11 | Disposition: A | Discharge status: Home / Self Care | Source: Ambulatory Visit | Attending: Vascular Surgery | Admitting: Vascular Surgery

## 2024-06-11 VITALS — BP 141/104 | HR 92 | Temp 98.1°F | Resp 20 | Ht 60.0 in | Wt 142.0 lb

## 2024-06-11 DIAGNOSIS — N186 End stage renal disease: Secondary | ICD-10-CM | POA: Insufficient documentation

## 2024-06-11 NOTE — Progress Notes (Signed)
 Patient ID: Kathy Lewis, female   DOB: [birth year], 28 y.o.   MRN: [id number]  Reason for Consult: No chief complaint on file.   Referred by Dennise Hoes, MD  Subjective:     HPI:  Kathy Lewis is a 28 y.o. female currently dialysis via catheter.  She is right-hand dominant has never had any upper extremity surgeries.  She has been evaluated in the past for dialysis access in [facility].  Catheters been in place for approximately 3 years.  She is on the transplant list but would need heart and kidney.  Past Medical History:  Diagnosis Date   Anemia    Anxiety    Bipolar 2 disorder (HCC)    Chronic systolic (congestive) heart failure (HCC)    Depression    DKA (diabetic ketoacidoses)    ESRD on hemodialysis (HCC)    TTS at [facility]   HSV infection    on valtrex    Hypokalemia    Leukocytosis    Migraine    Noncompliance with medication regimen    Preeclampsia    Prolonged QT syndrome    Severe anemia    Type 1 diabetes mellitus (HCC)    Family History  Adopted: Yes  Problem Relation Age of Onset   Heart disease Neg Hx    Past Surgical History:  Procedure Laterality Date   BIOPSY  04/24/2022   Procedure: BIOPSY;  Surgeon: Stacia Glendia BRAVO, MD;  Location: [facility] ENDOSCOPY;  Service: Gastroenterology;;   CARDIAC CATHETERIZATION     COLONOSCOPY WITH PROPOFOL  N/A 04/24/2022   Procedure: COLONOSCOPY WITH PROPOFOL ;  Surgeon: Stacia Glendia BRAVO, MD;  Location: [facility] ENDOSCOPY;  Service: Gastroenterology;  Laterality: N/A;   DILATION AND EVACUATION N/A 10/22/2019   Procedure: ULTRASOUND GUIDED DILATATION AND EVACUATION;  Surgeon: Timmie Norris, MD;  Location: [facility] LD ORS;  Service: Gynecology;  Laterality: N/A;   ESOPHAGOGASTRODUODENOSCOPY (EGD) WITH PROPOFOL  N/A 04/24/2022   Procedure: ESOPHAGOGASTRODUODENOSCOPY (EGD) WITH PROPOFOL ;  Surgeon: Stacia Glendia BRAVO, MD;  Location: [facility] ENDOSCOPY;  Service: Gastroenterology;  Laterality: N/A;   IR FLUORO GUIDE CV LINE RIGHT   09/14/2022   IR US  GUIDE VASC ACCESS RIGHT  09/14/2022   peritoneal dialysis catheter insertion     RENAL BIOPSY      Short Social History:  Social History   Tobacco Use   Smoking status: Former    Current packs/day: 1.00    Average packs/day: 1 pack/day for 2.0 years (2.0 ttl pk-yrs)    Types: Cigars, Cigarettes   Smokeless tobacco: Never  Substance Use Topics   Alcohol use: No    Allergies  Allergen Reactions   Cantaloupe Extract Allergy Skin Test Itching and Other (See Comments)    Mouth itching     Citrullus Vulgaris Itching and Other (See Comments)    Makes mouth itch, ALL melons     Food Itching and Other (See Comments)    All melon - mouth itching   Strawberry Extract Itching and Other (See Comments)    Mouth itching   Nsaids Itching and Other (See Comments)    Avoid per nephrology    Current Outpatient Medications  Medication Sig Dispense Refill   acetaminophen  (TYLENOL ) 500 MG tablet Take 1,000 mg by mouth daily as needed for headache (pain).     amitriptyline  (ELAVIL ) 50 MG tablet Take 1 tablet (50 mg total) by mouth at bedtime. 30 tablet 3   B Complex-C-Folic Acid  (RENA-VITE RX) 1 MG TABS Take 1 tablet by mouth daily.  calcium  acetate (PHOSLO ) 667 MG capsule Take 1,334 mg by mouth 3 (three) times daily with meals.     carvedilol  (COREG ) 6.25 MG tablet Take 1 tablet (6.25 mg total) by mouth 2 (two) times daily with a meal. 180 tablet 3   clonazePAM  (KLONOPIN ) 0.5 MG tablet Take 1 tablet (0.5 mg total) by mouth 2 (two) times daily as needed for anxiety. (Patient taking differently: Take 0.5 mg by mouth at bedtime.) 20 tablet 0   ELIQUIS  2.5 MG TABS tablet Take 2.5 mg by mouth 2 (two) times daily. (Patient not taking: Reported on 04/16/2024)     hydrOXYzine  (ATARAX ) 10 MG tablet Take 10 mg by mouth daily.     insulin  lispro (HUMALOG ) 100 UNIT/ML injection Inject 0.5 Units into the skin See admin instructions. 0.5 units per hour via insulin  pump.     isosorbide   mononitrate (IMDUR ) 30 MG 24 hr tablet Take 0.5 tablets (15 mg total) by mouth daily. 45 tablet 3   levETIRAcetam  (KEPPRA ) 250 MG tablet Take one tablet by mouth Tuesday, Thursday, and Saturday after you HD treatment (Patient not taking: Reported on 04/16/2024) 30 tablet 0   levETIRAcetam  (KEPPRA ) 500 MG tablet Take 1 tablet (500 mg total) by mouth daily at 6 (six) AM. (Patient not taking: Reported on 04/16/2024) 30 tablet 0   magnesium  oxide (MAG-OX) 400 MG tablet Take 2 tablets (800 mg total) by mouth daily. 60 tablet 6   methocarbamol  (ROBAXIN ) 500 MG tablet Take 500 mg by mouth as needed for muscle spasms.     metoCLOPramide  (REGLAN ) 10 MG tablet Take 1 tablet (10 mg total) by mouth every 6 (six) hours. (Patient not taking: Reported on 04/16/2024) 30 tablet 0   omeprazole (PRILOSEC) 20 MG capsule Take 20 mg by mouth daily.     SUMAtriptan  (IMITREX ) 50 MG tablet Take 50 mg by mouth daily as needed for headache.     UBRELVY  100 MG TABS Take 100 mg by mouth daily as needed (migraine).     valACYclovir  (VALTREX ) 500 MG tablet Take 500 mg by mouth daily as needed (cold sores).     VELPHORO  500 MG chewable tablet Chew 1,000 mg by mouth 3 (three) times daily with meals.     Vitamin D , Ergocalciferol , (DRISDOL ) 1.25 MG (50000 UNIT) CAPS capsule Take 50,000 Units by mouth every Sunday.     No current facility-administered medications for this visit.    Review of Systems  Constitutional:  Constitutional negative. HENT: HENT negative.  Eyes: Eyes negative.  Respiratory: Respiratory negative.  Cardiovascular: Cardiovascular negative.  GI: Gastrointestinal negative.  Musculoskeletal: Musculoskeletal negative.  Skin: Skin negative.  Neurological: Neurological negative. Hematologic: Hematologic/lymphatic negative.  Psychiatric: Psychiatric negative.        Objective:  Objective   Vitals:   06/11/24 1235  BP: (!) 141/104  Pulse: 92  Resp: 20  Temp: 98.1 F (36.7 C)  SpO2: 98%      Physical Exam HENT:     Head: Normocephalic.     Nose: Nose normal.     Mouth/Throat:     Mouth: Mucous membranes are moist.  Cardiovascular:     Rate and Rhythm: Normal rate.     Pulses:          Radial pulses are 1+ on the right side and 1+ on the left side.  Pulmonary:     Effort: Pulmonary effort is normal.  Abdominal:     General: Abdomen is flat.  Musculoskeletal:  General: Normal range of motion.     Right lower leg: No edema.     Left lower leg: No edema.  Skin:    General: Skin is warm.     Capillary Refill: Capillary refill takes less than 2 seconds.  Neurological:     General: No focal deficit present.     Mental Status: She is alert.     Data: Right Pre-Dialysis Findings:  +-----------------------+----------+--------------------+---------+--------  +  Location              PSV (cm/s)Intralum. Diam. (cm)Waveform  Comments  +-----------------------+----------+--------------------+---------+--------  +  Brachial Antecub. fossa32        0.39                triphasic           +-----------------------+----------+--------------------+---------+--------  +  Radial Art at Wrist    30        0.23                triphasic           +-----------------------+----------+--------------------+---------+--------  +  Ulnar Art at Wrist     37        0.20                triphasic           +-----------------------+----------+--------------------+---------+--------  +     Left Pre-Dialysis Findings:  +------------------+----------+------------------+----------+--------------  ----+  Location         PSV (cm/s)Intralum. Diam.   Waveform  Comments                                         (cm)                                             +------------------+----------+------------------+----------+--------------  ----+  Brachial Antecub. 42        0.32              triphasic                      fossa                                                                         +------------------+----------+------------------+----------+--------------  ----+  Radial Art at     33        0.18              triphasic                      Wrist                                                                        +------------------+----------+------------------+----------+--------------  ----+  Ulnar Art at Wrist21        0.12              monophasiclimited                                                                      visualization        +------------------+----------+------------------+----------+--------------  ----+    Right Cephalic   Diameter (cm)Depth (cm) Findings    +-----------------+-------------+----------+-----------+  Prox upper arm    0.26 / 0.22                        +-----------------+-------------+----------+-----------+  Mid upper arm        0.20                            +-----------------+-------------+----------+-----------+  Dist upper arm       0.11                            +-----------------+-------------+----------+-----------+  Antecubital fossa    0.14               thick walls  +-----------------+-------------+----------+-----------+  Prox forearm         0.15                            +-----------------+-------------+----------+-----------+  Mid forearm          0.16               thick walls  +-----------------+-------------+----------+-----------+  Dist forearm         0.08                            +-----------------+-------------+----------+-----------+   +-----------------+-------------+----------+--------------+  Right Basilic    Diameter (cm)Depth (cm)   Findings     +-----------------+-------------+----------+--------------+  Prox upper arm       0.25                               +-----------------+-------------+----------+--------------+  Mid upper arm         0.17                               +-----------------+-------------+----------+--------------+  Dist upper arm                          not visualized  +-----------------+-------------+----------+--------------+  Antecubital fossa    0.18                               +-----------------+-------------+----------+--------------+  Prox forearm         0.12                               +-----------------+-------------+----------+--------------+   +-----------------+-------------+----------+--------------+  Left Cephalic    Diameter (  cm)Depth (cm)   Findings     +-----------------+-------------+----------+--------------+  Prox upper arm    0.19 / 0.09                           +-----------------+-------------+----------+--------------+  Mid upper arm                           not visualized  +-----------------+-------------+----------+--------------+  Dist upper arm       0.11                               +-----------------+-------------+----------+--------------+  Antecubital fossa    0.19                               +-----------------+-------------+----------+--------------+  Prox forearm         0.08                               +-----------------+-------------+----------+--------------+  Mid forearm                             not visualized  +-----------------+-------------+----------+--------------+  Dist forearm         0.10                               +-----------------+-------------+----------+--------------+   +-----------------+-------------+----------+--------+  Left Basilic     Diameter (cm)Depth (cm)Findings  +-----------------+-------------+----------+--------+  Prox upper arm       0.24                         +-----------------+-------------+----------+--------+  Mid upper arm        0.24                         +-----------------+-------------+----------+--------+  Dist upper arm        0.17                         +-----------------+-------------+----------+--------+  Antecubital fossa    0.15                         +-----------------+-------------+----------+--------+  Prox forearm         0.14                         +-----------------+-------------+----------+--------+   Summary:   Measurements above.   NOTE: Suboptimal exam due to small veins.       Assessment/Plan:    28 year old female with history of end-stage renal disease currently dialyzing via catheter.  We discussed her options being catheter as she currently has versus fistula versus graft.  She does not appear to have suitable veins possibly a marginal basilic vein on her nondominant left upper extremity.  As such she would require likely graft possible basilic vein fistula in 2 stages in the left upper extremity.  At this time patient is unsure if she wants to proceed and is hopeful for transplant.  She can call to schedule when she is ready to proceed we will  need to hold Eliquis  48 hours prior to procedure.     Penne Lonni Colorado MD [facility]

## 2024-07-02 ENCOUNTER — Telehealth (HOSPITAL_COMMUNITY): Payer: Self-pay

## 2024-07-02 NOTE — Progress Notes (Signed)
  Hypoglycemia Event Note   Low BG reading: 50 mg/dl Time: 8777   Symptoms: asymptomatic   Action taken/Treatment: Juice/Soda PO/VT 8 oz - per patient request   Follow up BG reading: 51 mg/dl Time: 8761   Patient Response: Glucose less than 80 mg/dL. Retreatment was needed and administered  Juice/Soda PO/VT 8 oz per patient request. Patient stated IV dextrose  stung and did not want it. Patient wanted to wait for oral intake to take effect.   Actions taken to prevent repeat hypoglycemia: Meal expected within 45 minutes  BG 119 mg/dl at 8685.    Notification: Name of provider notified of low BG reading, treatment and patient response: Merl Schmitt   Possible factors contributing to hypoglycemia: Unknown/unable to assess and Poor oral intake/insufficient carbohydrate intake/emesis  Recent Labs    07/01/24 0814 07/01/24 1232 07/01/24 1753 07/01/24 2117 07/02/24 0818 07/02/24 1222 07/02/24 1238 07/02/24 1314  POCGLU 170* 144* 135 231* 96 50* 51* 119     Kathy KAMP, RN

## 2024-07-02 NOTE — Telephone Encounter (Signed)
 Outside/paper referral received by Dr. Virgia from [place]. Will fax over request for EKG if patient confirms interest. Insurance benefits and eligibility to be determined.   Attempted to call patient regarding interest in cardiac rehab- no answer, left message. Sent MyChart message.  F/u 11/11.

## 2024-07-03 NOTE — Progress Notes (Signed)
 Cardiology Progress Note    07/03/2024 Hospital Day: [name] Kathy Lewis is a 28 y.o.female who was admitted on 06/15/2024 as a suitable organ was identified. She underwent DCD OHT on 10/19 with planned kidney transplant 10/20.   Date of OHT: 06/15/2024 Listing Strategy: Status 4 Referring MD: Dr. Tobie  D[facility] Dr Virgia CMV D+/R+  EBV D in process /R+  Toxo in process/R - Donor HCV/HBV Ab Kathy: negative Post Transplant Crossmatch results: T cell negative and B cell negative Induction: Thymoglobulin PGD: none Explant path dilated idiopathic CM  Subjective:   NAEO. Reports that had a good day yesterday and was able to tolerate dinner (grilled sandwich and soup). This morning, she did not have any breakfast and had an episode of hypoglycemia. After our conversation, she began to have an episode of shaking stating that she was having nausea. Per mother, this clinical presentation is similar to prior episodes of psychogenic seizures.  Dr. Antonieta at the bedside. BG 136. Ativan  0.5 IVP ordered. Renal transplant team at the bedside.   Objective:   Vital signs in last 24 hours: Temp:  [36.6 C (97.9 F)-37 C (98.6 F)] 36.8 C (98.2 F) Heart Rate:  [84-113] 90 Resp:  [16-18] 18 BP: (95-136)/(59-97) 95/67 SpO2: 95 % Last BM Date: 07/02/24  Weights: Last weight: 60.3 kg (132 lb 15 oz)    First weight: 62.6 kg (138 lb 0.1 oz) BMI: Body mass index is 25.96 kg/m.  24-Hour Intake/Output: I/O last 2 completed shifts: In: 120 [P.O.:120] Out: 2510 [Urine:2200; Drains:310]  Telemetry: SR-ST 80's-110's   VAD:  No  Physical Exam:  General: alert, cooperative, and in NAD Respiratory: regular rate, symmetric, unlabored, clear to auscultation bilaterally, and no accessory muscle use Cardiac: regular rate, regular rhythm, S1, S2 present, no murmur, no rub, no gallop, and JVD flat Abdomen: normal bowel sounds, soft, nontender, and nondistended Extremities:  extremities warm and well perfused, no clubbing or cyanosis, no edema, and distal pulses intact Lines: PIV  Labs:  BMP: Recent Labs  Lab 07/02/24 0412  NA 141  K 4.4  CL 109*  CO2 20*  BUN 12  CREATININE 1.2*  GLUCOSE 129  CALCIUM  8.6*  MG 1.6*   CBC: Recent Labs  Lab 07/02/24 0412  WBC 8.6  HGB 10.3*  HCT 31.8*  PLT 322   INR: No results for input(s): INR in the last 168 hours.  FK: Recent Labs  Lab 07/02/24 0505  FK506 10.7   CYA: No results for input(s): CYA in the last 73719 hours. LDH: No results for input(s): LDH in the last 168 hours.       Scheduled Medications: .  acetaminophen , 650 mg, Oral, Q6H PRN .  [COMPLETED] amphotericin B LIPOSOME, 24 mg, Nebulization, Daily 1400 **AND** amphotericin B LIPOSOME, 24 mg, Nebulization, Weekly 1400 .  bisacodyL , 10 mg, Rectal, Daily PRN .  busPIRone, 5 mg, Oral, BID CC .  dextrose  50% in water , 12.5-25 g, Intravenous, As Directed .  escitalopram  oxalate, 10 mg, Oral, Daily .  ganciclovir (CYTOVENE) adult IV, 2.5 mg/kg, Intravenous, Q24H .  glucagon , 1 mg, Intramuscular, As Directed .  hydrOXYzine  (ATARAX ,VISTARIL ) oral, 25 mg, Oral, Q6H PRN .  insulin  LISPRO, 0-20 Units, Subcutaneous, TID CC **AND** insulin  LISPRO, 1-10 Units, Subcutaneous, As Directed **AND** insulin  LISPRO, 0.025-5 Units/hr, Subcutaneous, Continuous .  lidocaine , 0.5 mL, Subcutaneous, As Directed .  magnesium  oxide-magnesium  amino acid chelate, 266 mg, Oral, BID LS .  Medication Message, , XX,  ACHS+[phone] .  melatonin, 3 mg, Oral, QHS .  methocarbamoL , 500 mg, Oral, TID PRN .  metoclopramide  (REGLAN ) IV, 5 mg, Intravenous, TID2HPC .  mycophenolate, 540 mg, Oral, Q12H SCH .  Medication Message, 1 each, XX, As Directed Admin .  nystatin, 5 mL, Swish & Swallow, QID .  OLANZapine, 5 mg, Oral, QHS .  oxyCODONE , 5-10 mg, Oral, Q4H PRN .  pantoprazole  (PROTONIX ) IV, 40 mg, Intravenous, Daily .  polyethylene glycol, 17 g, Oral, BID .   predniSONE, 15 mg, Oral, Daily .  sennosides-docusate, 2 tablet, Oral, BID .  simethicone , 80 mg, Oral, QID PRN .  sulfamethoxazole-trimethoprim, 1 tablet, Oral, Daily .  tacrolimus, 2 mg, Oral, QHS .  tacrolimus, 3 mg, Oral, Daily  Assessment/Plan:   Principal Problem:   S/P orthotopic heart transplant (CMS/HHS-HCC) Active Problems:   Type 1 diabetes mellitus (CMS/HHS-HCC)   Dilated idiopathic cardiomyopathy (CMS/HHS-HCC)   Kidney transplanted (HHS-HCC)   Immunosuppressed status (HHS-HCC)   Prophylactic antibiotic   Postoperative anemia   Thrombocytopenia ()   Nausea   Ileus (CMS/HHS-HCC)   # NICM (DCM vs PPCM) s/p OHT 06/15/24 Appears euvolemic and well compensated  Echo:  10/27 EF >55%, nl RV, Severe TR 11/3: EF 55%, mild LV dysfunction, normal RV, mod TR EMBs: 10/27 0R, pAMR 0 11/4- 1R pAMR 0 RHCs: 10/27 RA 11, mPA 26, PCWP 16, CI 2.6 11/4- RA 4, RV 36/1, 5 PA 26  PCW 13  CI 3.2   Immunosuppression - Induction: ATG (dose reduced by 1/2 on 10/22 due to thrombocytopenia) - Starting lower dose pred to 15mg  today given c/f anxiety and EMB OK - Cellcept switched to myfortic (GI complaints). Continue myfortic 360 mg Q 12h. - FK level 8.4 11/5, goal level 8-12. Continue tacrolimus 3 mg q AM and 2 mg q PM. Daily levels.  - Will need to avoid Rt groin access for next EMB per nephrology team   Prophylaxis - Start ASA, rosuvastatin , calcium -vit D, and magnesium  at discharge - Transition PPI to Pepcid  given ongoing hypomagnesemia - Bacterial: vanc/cefazolin  x72hr -completed - PCP/Toxo: Bactrim per protocol - CMV: intermediate. Continue ganciclovir - Candidiasis: Nystatin swish and swallow while inpatient - Aspergillus: inhAmpho weekly until discharge. Fluconazole  per surgical protocol    ESRD s/p DDKT on 10/20 (Right) Hypophosphatemia Hypomagnesemia Abnormal PTH Vit D Deficiency Previously on PD and HD. S/p DDKT on 06/16/24. Good UOP and renal function. However, severe  electrolyte deficiencies iso overall poor intake. Renal transplant team co-managing care - Ureteral Stent placed at time of  transplant, will be removed ~6 weeks in outpatient setting (stent is 4.73fr instead of standard 13fr)  - Abdominal transplant team following  - Urine culture 10/21: negative  - Urine electrolytes - Repleting Mag/Phos as tolerated, will place PICC line as unable to tolerate via PIV - Permissive HTN per renal tx team - BK screening to start 1 mo after TX   #Nausea/Vomiting #Protein Malnutrition Ongoing. Tolerating liquids and medications PO. Declining PRN zofran  and requesting compazine  instead but risk for Extrapyramidal effects with reglan . Continues to have hypoglycemia and severe electrolyte deficiencies. Will need to start considering TF. - LFTs/amylase/lipase WNL - Continue scheduled reglan  for now - PRN Vistaril  for both anxiety and nausea for now(first line) - PRN compazine  PRN second line - Will dc oral magnesium  and replete IV once PICC line in - Nutrition re-consulted  # AMS # Anxiety # Psychogenic nonepileptic seizures exacerbated by under treated anxiety Episode of aphasia and lethargy 10/30.  Brain imaging negative.  Ongoing anxiety throughout post-operative phase. Psych consulted and following - continue Lexapro ; dose increased to10mg  daily on 11/4 - continue new Buspar 5mg  BID - continue new Zyprexa 5mg  qhs - continue PRN Hydroxyzine  25mg  q6h PRN  - stress management c/s - chaplin c/s - She will need ongoing psychology and CBT - Re-consulted Psych given long episode this AM    Type I DM A1c 7 on 03/31/2024. DMS following. Having hypoglycemia episodes due to overall poor intake.  - Will order D10 at 77ml/hr per DMS recommendations - Continue Accuchecks AC and HS  Acute post operative pain  [facility] Tylenol   - PRN Oxycodone     #Mild Ileus - resolved KUB 11/05 c/f mild ileus. KUB 11/06 with non-obstructive gas pattern. - Continue SCH Senokot -  S. - Refusing  scheduled Miralax    - Will continue to monitor  General Debility  - Aggressive PT/OT      Comorbid Conditions: Nutritional Disorders:    Hypoalbuminemia:  Hypoalbuminemia present with lowest albumin  of 2.3.   Hypoalbuminemia is associated with increased risk for patients.  We will attempt to treat the underlying condition(s) contributing to this low albumin  state. Electrolyte Disorders:    Hypomagnesemia:  Hypomagnesemia present with lowest magnesium  of 1.7.   Will continue to monitor.   Hematologic Disorders:    Anemia:  Anemia present with lowest hemoglobin of 9.2.  Will continue to monitor.          Code Status: Full Code VTE Prophylaxis:  VTE Prophylaxis (Last Assessment on 11/3 at  8:15 AM)  + Anti-Embolism Compression Device Ordered  X Anticoagulant Not Ordered due to Bleeding Risk     Discharge Planning: pending clinical course   Kathy ROCIO FAJARDO, NP  ------------------------------------------------------------------------------- Attestation signed by Antonieta Darby, MD at 07/03/2024  2:49 PM Attestation Statement:   I personally saw the patient and performed a substantive portion of the medical decision making, in conjunction with the Advanced Practice Provider for the condition/treatment of  82F heart/kidney 06/15/24,  ATG induction doing well from a medical standpoint, however with anxiety and poor PO intake due to nausea, for which psych is following and on several medications for. She has pseudoseizures and multiple electrolyte abnormalities with her poor PO intake. VitD repletion, stop PPi. 2 nd biopsy was 1R, pAMR 0 with good hemos, reduce pred. IS: tacro at goal, myfortic 540 BID and pred 15  AFERDITA SPAHILLARI, MD  -------------------------------------------------------------------------------

## 2024-07-03 NOTE — Progress Notes (Signed)
 Case Management Discharge Planning Note  Barriers identified?  Adult Barriers:   Will patient be medically ready for discharge within next 48 hours?:   Next steps:    EDD: 07/11/2024  Preferred DC location: Home  Medical Readiness for Discharge:  Reassessment Documentation Goals prior to discharge: OHT on 10/19 and DDKT on 10/20. Pt not medically stable for dc. Mobility: ambulates with assistance Anticipated care needs at discharge include: Other (see comment) Anticipated care needs comment: TBD. No known needs currently identified. Preferred Discharge Location: Home  Team recommendations and collaboration for discharge planning Home self care  Pt / Patient representative conversation and collaboration for discharge planning Pt not medically ready for dc. Patient and/or their representative have participated in and are in agreement with discharge plan. yes  CM Next Steps: Follow for medical stability Follow for therapy recommendations as needed and make referrals to appropriate levels of care Pt/Representative will coordinate transport home Deliver follow-up IMM within 48 hours of DC. Finalize appropriate documentation flowsheets prior to discharge Complete Final ADT documentation Complete CM DC Summary / Closing Note DC needs are likely but unclear; will continue to monitor closely with reassessments  Coordinated Resources:  No resources indicated at this time  TRANSPLANT CLINICAL SOCIAL WORK NOTE  Screen:  Mental Status: A&O x 4 Mood:  euthymic Affect: appropriate Thought Process: logical,goal-directed General Appearance: age appropriate Attitude towards interviewer: cooperative  Intervention:  Brief Check-in    Outcome:  Receptive, asking appropriate questions  No needs identified.  Per APP, pt will be here for a while, as she is sick.  Bernarda HERO. Gretel, DSW, CCM, LCSWA [facility] Advanced Heart Failure Therapies Work Cell:   [phone] Pager: [phone] Fax:  [phone]

## 2024-07-03 NOTE — Progress Notes (Signed)
 [facility] Adult Abdominal Transplant  Progress note   HISTORY OF PRESENT ILLNESS   Underlying kidney disease: T1DM Kidney transplant details: DDKT on 06/16/24 (DCD, PRA 76/85, KDPI 29%, Thymo induction, Rkidney to RLQ, 1a/1v/1u, CIT 19h 82m, WIT 14m, pump 15h, CMV +/+, EBV -/+, + ureteral stent, not IR). Kidney made urine at reperfusion. No intraoperative complications.   Kathy Lewis is a 28 y.o. 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 She is now s/p OHT 06/15/24 followed by DDKT 06/16/24 with immediate kidney allograft function.   Hospital course primarily notable for: 1) RRT 10/30 for decreased responsiveness and possible nystagmus, ultimately attributed to pain causing transient inability to converse with MRI negative for PRES 2) Transplant psychiatry c/s for severe anxiety and c/f PNES; multiple BRT consult during admission 3) MMF changed to Myfortic for GI symptoms  LAST 24-HOUR EVENTS   - NAEO, BP a little softer but stable  - Episode of shaking and nausea this morning; per mother, similar presentation as previous PNES episodes  - UOP 1.4 L + 2 unrecorded occurrences last 24 hours    CURRENT MEDICATIONS   SCHEDULED MEDICATIONS amphotericin B LIPOSOME, 24 mg, Nebulization, Weekly 1400 busPIRone, 5 mg, Oral, BID CC cholecalciferol, 50,000 Units, Oral, Weekly escitalopram  oxalate, 10 mg, Oral, Daily famotidine , 10 mg, Oral, BID ganciclovir (CYTOVENE) adult IV, 2.5 mg/kg, Intravenous, Q24H GI Cocktail, 30 mL, Oral, Once insulin  LISPRO, 0-20 Units, Subcutaneous, TID CC Medication Message, , XX, ACHS+0000+0300 melatonin, 3 mg, Oral, QHS metoclopramide  (REGLAN ) IV, 5 mg, Intravenous, TID2HPC mycophenolate, 540 mg, Oral, Q12H SCH Medication Message, 1 each, XX, As Directed Admin nystatin, 5 mL, Swish & Swallow, QID OLANZapine,  5 mg, Oral, QHS polyethylene glycol, 17 g, Oral, BID predniSONE, 15 mg, Oral, Daily sennosides-docusate, 2 tablet, Oral, BID sodium phosphate  IV (ADULT peripheral), 30 mmol, Intravenous, Once sulfamethoxazole-trimethoprim, 1 tablet, Oral, Daily tacrolimus, 2 mg, Oral, QHS tacrolimus, 3 mg, Oral, Daily thiamine, 100 mg, Oral, Daily   MEDICATION INFUSIONS dextrose , Last Rate: 30 mL/hr at 07/03/24 1215   PRN MEDICATIONS acetaminophen , 650 mg, Oral, Q6H PRN bisacodyL , 10 mg, Rectal, Daily PRN dextrose  50% in water , 12.5-25 g, Intravenous, As Directed glucagon , 1 mg, Intramuscular, As Directed hydrOXYzine  (ATARAX ,VISTARIL ) oral, 25 mg, Oral, Q6H PRN insulin  LISPRO, 1-10 Units, Subcutaneous, As Directed lidocaine , 0.5 mL, Subcutaneous, As Directed methocarbamoL , 500 mg, Oral, TID PRN oxyCODONE , 5-10 mg, Oral, Q4H PRN simethicone , 80 mg, Oral, QID PRN    PHYSICAL EXAMINATION   Vital signs in last 24 hours: Temp:  [36.6 C (97.9 F)-37 C (98.6 F)] 36.7 C (98 F) Heart Rate:  [84-109] 109 Resp:  [16-18] 18 BP: (95-123)/(56-78) 123/75 Temp (24hrs), Avg:36.8 C (98.2 F), Min:36.6 C (97.9 F), Max:37 C (98.6 F)  SpO2: 99 % Weight: 60.3 kg (132 lb 15 oz)  Physical Exam: Constitutional: no acute distress Neuro: AOx3 Cardiovascular: regular rate and rhythm on monitor  Respiratory: breathing unlabored Abdomen: soft, non-distended. RLQ incision well approximated. JP drain with serous output.  Extremities: no lower extremity edema  Lines/Drains/Tubes: Patient Lines/Drains/Airways Status     Active PICC Line / CVC Line / PIV Line / Drain / Intraosseous Line / ART Line / Line / NG/OG Tube     Name Placement date Placement time Site Days   Peripheral IV 06/26/24 Right Upper arm;Cephalic 06/26/24  1920  Upper arm;Cephalic  7  Peripheral IV 06/28/24 Left;Proximal Forearm 06/28/24  1827  Forearm  5   Closed/Suction Drain Right;Lower;Lateral Abdomen 19 Fr. 06/16/24  1710   Abdomen  17   Insulin  Pump - subcutaneous 06/24/24 Abdomen LLQ 06/24/24  --  Abdomen LLQ  9            LABORATORY/IMAGING DATA    Recent Labs  Lab 07/01/24 0856 07/01/24 1628 07/02/24 0412 07/03/24 0627  WBC 6.4  --  8.6 5.0  HGB 9.3* 9.5*  9.4* 10.3* 9.2*  HCT 29.5*  --  31.8* 29.3*  PLT 308  --  322 301    Recent Labs  Lab 07/01/24 0603 07/02/24 0412 07/03/24 0626  NA 140 141 140  K 4.6 4.4 4.6  CL 108 109* 109*  CO2 21 20* 23  BUN 14 12 7   CREATININE 1.3* 1.2* 1.2*  GLUCOSE 229* 129 77  MG 1.5* 1.6* 1.7*  PHOS 1.5* 1.3* 1.2*  CALCIUM  8.4* 8.6* 8.3*  ALB 2.7* 2.9* 2.3*    No results for input(s): AST, ALT, TBILI, ALKPHOS in the last 168 hours.   Recent Labs  Lab 07/01/24 0603 07/02/24 0505 07/03/24 0627  FK506 8.1 10.7 8.4   Recent Labs    07/02/24 2107 07/03/24 0506 07/03/24 0805 07/03/24 0830 07/03/24 0850 07/03/24 1120  POCGLU 147* 96 52* 70 116 136    MICROBIOLOGY AND SEROLOGIES  Lab Results  Component Value Date   BLDCULT No growth detected. 06/25/2024   Lab Results  Component Value Date   URCULT No growth 06/25/2024   URCULT No growth 06/17/2024   No results found for: CULTRESP  DIAGNOSTIC IMAGING & PROCEDURES  I have personally reviewed the images and the radiologists' report.  ASSESSMENT AND PLAN   Patient Active Problem List  Diagnosis  . Type 1 diabetes mellitus (CMS/HHS-HCC)  . Hypercholesterolemia  . Chronic systolic CHF (congestive heart failure) (CMS/HHS-HCC)  . NICM (nonischemic cardiomyopathy) (CMS/HHS-HCC)  . Pre-transplant evaluation for kidney transplant  . ESRD on dialysis (CMS/HHS-HCC)  . Dilated idiopathic cardiomyopathy (CMS/HHS-HCC)  . Kidney transplanted (HHS-HCC)  . Immunosuppressed status (HHS-HCC)  . Prophylactic antibiotic  . S/P orthotopic heart transplant (CMS/HHS-HCC)  . Postoperative anemia  . Thrombocytopenia ()  . Nausea  . Ileus (CMS/HHS-HCC)  . History of internal jugular  thrombosis     #Kidney transplant #Ureteral stent  - Cr back down to approximate baseline 1.2  - Interval US  10/28 with patent vasculature and no hydronephrosis - Perinephric drain intact (fluid Cr 1.4 on 10/27); plan to discharge with drain given high volume - Ureteral stent placed at time of transplant; will be removed ~6 weeks in outpatient setting (stent is 4.63fr instead of standard 36fr)    #Immunosuppression  - Induction agent: Thymoglobulin (total of 6mg /kg given) - Tacrolimus goal trough per heart team 8-12 - Myfortic 540mg  Q12H - Steroid taper per heart team   #OHT >> Heart transplant on 06/15/24 - From kidney perspective - ok for permissive HTN to allow for renal perfusion - Please avoid R femoral access for future RHC/biopsies   #PNES #Anxiety >> initial c/f stroke VS seizure, neurology consulted felt to be panic attack. Brain MRI negative for PRES.Continues with episodes of distress, anxiety and seizure like episodes.  Prior h/o of nonepileptic spells. - Transplant psych and stress management following - Scheduled Lexapro  and Olanzapine - PRN Hydroxyzine     #Prophylaxis - Viral ppx (Donor +/Recipient +): Ganciclovir - PCP ppx: Bactrim x1 year - GN ppx: Cefazolin  -  GP ppx: Vancomycin   - Fungal ppx: ampho and nystatin - RAS ppx: ASA 81  - GI ppx: Protonix   - DVT ppx: see below   #Diabetes #At risk for steroid-induced hyperglycemia >> h/o T1DM - Endocrine following - On insulin  pump    The patient's above examination and plan of care was discussed with Dr. Nadara, Attending MD. Patient was discussed on multidisciplinary rounds and care was coordinated with the [facility] and [facility] teams.   I personally performed the service, non-incident to. [facility])   DEBBY COUGH [place], [place] 07/03/2024 Adult Abdominal Transplant Surgery

## 2024-07-03 NOTE — Consults (Signed)
 [facility] INPATIENT CONSULT NOTE  Primary Service Requesting Consult: Cardio-Thoracic Surgery Primary Service Provider Requesting Consultation: Antonieta Darby, MD  Reason for Consult/Chief Complaint: Nausea and vomiting   HISTORY OF PRESENT ILLNESS:  Kathy Lewis is a 28 y.o. female with T1DM, ESRD on PD, NICM with severe LV Dysfunction who as admitted for heart/kidney transplant now s/p OHT 06/15/2024 and DDKT 06/16/2024. GI is consulted for nausea   Patient reports ongoing nausea and vomiting for years. She states that she has had issues with intermittent nausea on and off since at least 2023.  She says in 2023 that she saw a GI doctor and that they did a full test with no findings. She reports taking pantoprazole  at that time and is not sure if it improved her symptoms. Currently, she denies any abdominal pain and states that she really only has left shoulder pain intermittently since her surgical intervention. She states that her nausea and vomiting occurs both with and without food.  She also notes associated dizziness and feeling as if the room is spinning intermittently. She states that she has had relatively regular bowel movements in the postsurgical setting, but did have an episode of diarrhea at some point with no bloody or black stool.  When asked about alleviating or provoking factors, the patient notes that she has responded well to Compazine  and that this appears to help her nausea the most.  She also notes significant anxiety that she reports makes her nausea and vomiting significantly worse.  REVIEW OF SYSTEMS:  A 10-point ROS is negative except as noted in HPI.  HISTORY:   Past Medical History:  Diagnosis Date  . Anxiety   . Bipolar 2 disorder (CMS/HHS-HCC)   . CHF (congestive heart failure) (CMS/HHS-HCC) N/A  . Diabetes mellitus type 1 (CMS/HHS-HCC)   . ESRD (end stage renal disease) (CMS/HHS-HCC)   . Gastroparesis   . History of CVA  (cerebrovascular accident) without residual deficits   . History of internal jugular thrombosis    per chart review previously on Eliquis  for right IJ DVT  . History of pre-eclampsia   . History of tobacco use   . History of UTI   . HLD (hyperlipidemia)   . HSV infection   . HTN (hypertension)   . Hx of transfusion of whole blood   . NICM (nonischemic cardiomyopathy) (CMS/HHS-HCC)   . Personal history of pulmonary embolism     Past Surgical History:  Procedure Laterality Date  . permacath insertion  08/2022  . TRANSPLANT HEART N/A 06/15/2024   Procedure: ADULT, HEART TRANSPLANT, WITH OR WITHOUT RECIPIENT CARDIECTOMY;  Surgeon: Virgia Korene Raman, MD;  Location: [facility];  Service: Cardiothoracic;  Laterality: N/A;  . TRANSPLANT KIDNEY N/A 06/16/2024   Procedure: ADULT, RENAL ALLOTRANSPLANTATION, IMPLANTATION OF GRAFT; WITHOUT RECIPIENT NEPHRECTOMY;  Surgeon: Charlanne Rounds, MD;  Location: [facility];  Service: General Surgery;  Laterality: N/A;  . CARDIAC CATHETERIZATION  N/A  . hx heart and kidney biopsies    . INSERTION PERITONEAL CANNULA/CATH FOR DIALYSIS    . OMENTECTOMY     PD catheter revision with partial omentectomy  . PERITONEAL CATHETER REMOVAL     Family History  Adopted: Yes  Problem Relation Age of Onset  . Ovarian cancer Mother   . Heart disease Father   . Chronic kidney disease Father   . No Known Problems Sister    Social History   Socioeconomic History  . Marital status: Single  . Number of children:  1  Tobacco Use  . Smoking status: Former    Current packs/day: 0.00    Average packs/day: 0.3 packs/day for 3.0 years (0.8 ttl pk-yrs)    Types: Cigarettes    Start date: 12/04/2015    Quit date: 12/04/2018    Years since quitting: 5.5  . Smokeless tobacco: Never  Vaping Use  . Vaping status: Never Used  Substance and Sexual Activity  . Alcohol use: Never  . Drug use: Not Currently    Types: Benzodiazepines    Comment: Rx'd  Klonopin  prn  . Sexual activity: Not Currently    Comment: LMP- last 2022  Social History Narrative   Lives at home with her adoptive father and daughter   She has a 92 yo daughter      On disability      Caregivers would be father and mom   Social Drivers of Corporate Investment Banker Strain: Low Risk  (06/20/2024)   Overall Financial Resource Strain (CARDIA)   . Difficulty of Paying Living Expenses: Not hard at all  Food Insecurity: No Food Insecurity (06/20/2024)   Hunger Vital Sign   . Worried About Programme Researcher, Broadcasting/film/video in the Last Year: Never true   . Ran Out of Food in the Last Year: Never true  Transportation Needs: No Transportation Needs (06/20/2024)   PRAPARE - Transportation   . Lack of Transportation (Medical): No   . Lack of Transportation (Non-Medical): No  Physical Activity: Insufficiently Active (02/06/2024)   [facility]   Exercise Vital Sign   . On average, how many days per week do you engage in moderate to strenuous exercise (like a brisk walk)?: 3 days   . On average, how many minutes do you engage in exercise at this level?: 30 min  Stress: No Stress Concern Present (02/06/2024)   Received from [facility] - Occupational Stress Questionnaire   . Feeling of Stress : Not at all  Social Connections: Socially Integrated (02/06/2024)   Received from [facility]   Social Network   . How would you rate your social network (family, work, friends)?: Good participation with social networks  Housing Stability: Low Risk  (06/20/2024)   Housing Stability Vital Sign   . Unable to Pay for Housing in the Last Year: No   . Number of Times Moved in the Last Year: 0   . Homeless in the Last Year: No   MEDICATIONS:   Medications Prior to Admission  Medication Sig Dispense Refill  . acetaminophen  (TYLENOL ) 500 MG tablet Take 500 mg by mouth every 6 (six) hours as needed    . amitriptyline  (ELAVIL ) 50 MG tablet  Take 50 mg by mouth at bedtime    . amLODIPine  (NORVASC ) 10 MG tablet Take 10 mg by mouth once daily    . atorvastatin  (LIPITOR ) 40 MG tablet Take 40 mg by mouth every evening    . DEXCOM G6 RECEIVER Misc  (Patient not taking: Reported on 04/09/2024)    . ergocalciferol , vitamin D2, 1,250 mcg (50,000 unit) capsule Take 50,000 Units by mouth once a week    . HUMALOG  KWIKPEN 100 unit/mL pen injector INJECT 50 UNITS SUBCUTANEOUSLY DAILY 45 mL 11  . hydrALAZINE  (APRESOLINE ) 50 MG tablet Take 50 mg by mouth 2 (two) times daily    . insulin  needles, disposable, (PEN NEEDLE) 31 X 5/16  needle as directed. 540 each 11  . magnesium  oxide (MAG-OX) 400  mg (241.3 mg magnesium ) tablet Take 800 mg by mouth once daily    . methocarbamoL  (ROBAXIN ) 500 MG tablet Take 500 mg by mouth 2 (two) times daily as needed    . metoclopramide  (REGLAN ) 5 MG tablet Take 5 mg by mouth 3 (three) times daily as needed    . omeprazole (PRILOSEC) 20 MG DR capsule Take 20 mg by mouth once daily    . valACYclovir  (VALTREX ) 500 MG tablet Take 500 mg by mouth at bedtime as needed    . VELPHORO  500 mg tablet Take 1,000 mg by mouth 3 (three) times daily with meals      Scheduled Inpatient Medications:  . amphotericin B LIPOSOME  24 mg Nebulization Weekly 1400  . busPIRone  5 mg Oral BID CC  . cholecalciferol  50,000 Units Oral Weekly  . escitalopram  oxalate  10 mg Oral Daily  . famotidine   10 mg Oral BID  . ganciclovir (CYTOVENE) adult IV  2.5 mg/kg Intravenous Q24H  . GI Cocktail  30 mL Oral Once  . insulin  LISPRO  0-20 Units Subcutaneous TID CC  . Medication Message   XX [phone]  . melatonin  3 mg Oral QHS  . metoclopramide  (REGLAN ) IV  5 mg Intravenous TID2HPC  . mycophenolate  540 mg Oral Q12H SCH  . Medication Message  1 each XX As Directed Admin  . nystatin  5 mL Swish & Swallow QID  . OLANZapine  5 mg Oral QHS  . polyethylene glycol  17 g Oral BID  . predniSONE  15 mg Oral Daily  . sennosides-docusate  2  tablet Oral BID  . sodium phosphate  IV (ADULT peripheral)  30 mmol Intravenous Once  . sulfamethoxazole-trimethoprim  1 tablet Oral Daily  . tacrolimus  2 mg Oral QHS  . tacrolimus  3 mg Oral Daily  . thiamine  100 mg Oral Daily   PRN Inpatient Medications: acetaminophen , bisacodyL , dextrose  50% in water , glucagon , hydrOXYzine  (ATARAX ,VISTARIL ) oral, insulin  LISPRO **AND** insulin  LISPRO **AND** insulin  LISPRO, lidocaine , methocarbamoL , oxyCODONE , simethicone   Continuous Inpatient Infusions:  . dextrose  30 mL/hr at 07/03/24 1215  . insulin  LISPRO 1 Units/hr (07/02/24 1740)    ALLERGIES:   Allergies  Allergen Reactions  . Cantaloupe Itching and Other (See Comments)    Mouth itching  Mouth itching  Per patient she has no food or drug allergies 02/06/2024  . Nsaids (Non-Steroidal Anti-Inflammatory Drug) Itching and Other (See Comments)    Avoid per nephrology      PHYSICAL EXAMINATION:  BP 123/75 (BP Location: Left upper arm, Patient Position: Lying, BP Cuff Size: Small Adult)   Pulse 109   Temp 36.7 C (98 F) (Oral)   Resp 18   Ht 152.4 cm (5')   Wt 60.3 kg (132 lb 15 oz)   SpO2 99%   BMI 25.96 kg/m  General: in NAD, anxious affect  HEENT: sclera anicteric Respiratory: breathing comfortably on room air  Abdomen: soft, nondistended, no tenderness to palpation  Extremities: no LE edema Skin: no rashes or lesions, sternal surgical site with staples in place Neurologic: alert and conversant  DATA:  Labs: Recent Labs  Lab 07/01/24 0856 07/01/24 1628 07/02/24 0412 07/03/24 0627  WBC 6.4  --  8.6 5.0  HGB 9.3* 9.5*  9.4* 10.3* 9.2*  HCT 29.5*  --  31.8* 29.3*  PLT 308  --  322 301   Recent Labs  Lab 07/01/24 0603 07/02/24 0412 07/03/24 0626  NA 140 141 140  K 4.6  4.4 4.6  CL 108 109* 109*  CO2 21 20* 23  BUN 14 12 7   CREATININE 1.3* 1.2* 1.2*  GLUCOSE 229* 129 77  CALCIUM  8.4* 8.6* 8.3*  ALB 2.7* 2.9* 2.3*   No results for input(s): AST, ALT,  ALKPHOS, TBILI in the last 168 hours.  No results for input(s): APTT, INR in the last 168 hours.  Imaging: XR abdomen AP    Comparison: CT 06/26/24   Indication: ABD pain, Z94.0 Kidney transplant status (HHS-HCC)   Number of views: One view, portable supine abdomen AP   Findings/Impression:   Lung bases poorly visualized.   An electronic device overlies the left lower quadrant. Right lower quadrant surgical drain and skin staples. Transplant ureteral stent right lower quadrant.   Scattered gas within loops of small bowel and colon may represent mild ileus. No definite findings of obstruction.   Limited evaluation for pneumoperitoneum on this supine exam.   No acute appearing osseous abnormality.   Electronically Signed by:  Lauraine Ned, MD, [facility] Electronically Signed on:  07/02/2024 9:52 AM  CT abdomen and pelvis without IV contrast   Comparison: 03/31/2024 CT chest abdomen pelvis.   Indication: s/p heart and kidney transplant, concern for infection, Z94.0 Kidney transplant status (HHS-HCC), Z94.1 Heart transplant status (CMS/HHS-HCC).   Technique:  CT imaging of the abdomen and pelvis was performed without intravenous contrast. Coronal and sagittal reformatted images were generated and reviewed.   Findings: Limited assessment given the lack of IV contrast, particularly of the solid organs, bowel, and vasculature.   Please see separately dictated same day chest CT for findings above the diaphragm.   - Liver: Unremarkable noncontrast appearance.    - Biliary and Gallbladder: No intrahepatic or extrahepatic bile duct dilatation.   - Spleen: Normal in size.     - Pancreas: Unremarkable noncontrast appearance.    - Adrenal Glands: Normal.    - Kidneys: Status post right lower quadrant kidney transplantation. No peritransplant fluid collection. Double-J ureteral stent in place. No renal stones. No hydronephrosis.    - Abdominal and Pelvic  Vasculature: No abdominal aortic aneurysm.   - Gastrointestinal Tract: No evidence of bowel obstruction.  Normal appendix.   - Peritoneum/Mesentery/Retroperitoneum: Trace ascites and mesenteric edema.  No free intraperitoneal air.  Surgical drain in the right lower quadrant.   - Lymph Nodes: No retroperitoneal, mesenteric, or pelvic lymphadenopathy.     - Bladder: Normal in appearance.   - Pelvic Organs: Unremarkable.   - Body Wall: Postsurgical changes in the anterior right lower quadrant.   - Musculoskeletal:  No aggressive appearing osseous lesions. Status post median sternotomy.     Impression: Expected postsurgical changes of kidney transplantation. No organized fluid collection. Trace ascites and mesenteric edema.   Please see separately dictated same day chest CT for findings above the diaphragm.     Electronically Reviewed by:  Hezzie Bellingham, MD, [facility] Electronically Reviewed on:  06/26/2024 2:50 PM  CLINICAL DATA:  Abdominal pain with vomiting, history of diabetes  mellitus, concern for gastroparesis.   EXAM:  NUCLEAR MEDICINE GASTRIC EMPTYING SCAN   TECHNIQUE:  After oral ingestion of radiolabeled meal, sequential abdominal  images were obtained for 120 minutes. Residual percentage of  activity remaining within the stomach was calculated at 60 and 120  minutes.   RADIOPHARMACEUTICALS: 2.0 mCi Tc-82m sulfur  colloid in standardized  meal   COMPARISON: CT abdomen pelvis Jan 21, 2022   FINDINGS:  Expected location of the stomach in the  left upper quadrant.  Ingested meal empties the stomach gradually over the course of the  study with 32% retention at 60 min and 8% retention at 120 min  (normal retention less than 30% at a 120 min).   IMPRESSION:  Normal gastric emptying study.   Endoscopic Procedures: EGD and colonoscopy 03/2022   Colonoscopy:  Impression:  - Preparation of the colon was extremely poor. - The distal rectum is normal. - No  specimens collected.   EGD:  - Normal esophagus. - Erythematous mucosa in the gastric body. Biopsied. - Friable duodenal mucosa. Biopsied. - No endoscopic abnormalities to explain patient's  nausea, vomiting or abdominal pain. - Symptoms seem most consistent with cyclic  vomiting syndrome   FINAL MICROSCOPIC DIAGNOSIS:   A. DUODENUM, BIOPSY:  -  Duodenal mucosa within normal limits.   B. STOMACH, BIOPSY:  -  Antral and oxyntic mucosa with no significant pathology.  -  No Helicobacter pylori organisms identified on HE stained slide.  ASSESSMENT:  Kathy Lewis is a 28 y.o. female with T1DM, ESRD on PD, NICM with severe LV Dysfunction who as admitted for heart/kidney transplant now s/p OHT 06/15/2024 and DDKT 06/16/2024. GI is consulted for nausea.    Patient has symptoms and clinical history consistent with acute on chronic nausea. Previously elevated A1c up to greater than 14 10 + years ago, but relatively good control in recent years. She notably had a gastric emptying studying 01/2022 that was normal. She was seen by an outside GI group for these symptoms years ago with concern for cyclic vomiting syndrome given the negative gastric emptying study. At that time did have some benefit from pantropazole regarding the symptoms.  Given this and her recent surgical intervention there is concern that the patient has developed post surgical gastroparesis and potential vagal nerve injury in the setting of heart transplant.  She also appeared to potentially meet criteria for functional dyspepsia with response to PPI previously.  Given this, could consider resuming PPI and repeating gastric emptying study when patient is able to tolerate being off of opioids and prokinetic agents.  Would also start the patient on a gastroparesis diet and monitor symptoms.   Hypothyrodisim and adrenal insufficency can also be etiologies of nausea and vomiting. Patient has been on prednisone for immunosuppresion  in the setting of OHT and DDKT. Would check TSH and AM cortisol for further evaluation.   Unfortunately, she is on a significant amount of medications that can cause nausea including buspar,  bactrim, gangyclovir opioids and has a considerable recent surgical history. Given this, would minimize these medications as possible but understand that this is complicated in the post transplant setting.  Patient also notes significant cannot psychiatric history contributing to her current clinical picture and is on buspar.  Could consider alternative psychiatric regimen with psychiatry who is already following the patient.   Recommendations: -Check TSH -Check AM cortisol  -Can trial daily PPI and monitor symptoms  -Patient appears to be responding well to compazine  can consider scheduling this medication if appropriate -Can trial scopolamine  patch if compazine  scheduled does not improve symptoms  -Trial Gastroparesis diet  -Can consider gastric emptying study inpatient vs outpatient but patient would need to be off opioids and prokinetic agent   This plan was discussed with the supervising GI attending physician: Dr. Debarah. Please see the attending attestation/separate note for additional information and any updated recommendations.  Thank you for this consult. We will continue to follow.   DRAKE A  [facility], MD Gastroenterology Fellow  If you have any questions or concerns, please contact the covering GI fellow at the functional pager listed below: GI General Consult Pager: *[phone]  ------------------------------------------------------------------------------- Attestation signed by Lewis Kathy Franky Mickey., MD at 07/03/2024  9:15 PM ATTESTATION   Please see the separate attending consultation note for additional comments.  (FR)  I personally saw and evaluated the patient, and participated in the management and treatment plan as documented in the resident/fellow note.  Kathy MARLA DEBARAH, MD Attending  Physician Division of Gastroenterology  -------------------------------------------------------------------------------

## 2024-07-03 NOTE — Progress Notes (Signed)
 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 She is now s/p OHT 06/15/24 followed by DDKT 06/16/24 with immediate kidney allograft function with history of gastroparesis who has been intolerant to oral intake.  Currently on compazine  prn, metoclopramide  5 mg tid (ordered for 2 hours after meals), Buspirone.    Diagnostic Testing: CT a/p Non Ctx: No obstructive findings. MRI Brain :Tiny chronic infarcts of the right cerebellum, right temporal and parietal lobes, and left occipital lobe, likely reflecting sequelae of remote embolic phenomena.  Comment: she is at risk for delayed gastric emptying with hx of type 1 DM (though prior SPGES was normal) and s/p OHT.  She is on Buspirone, which can lead to increased gastric accomodation.  She has post-surgical polypharmacy which several agents that can cause nausea.  Dosing of Metoclopramide  could be optimized to prior to meals along with standing anti-emetics.  Recs: - Initiate empiric gastroparesis diet - Continue Metoclopramide  5 mg tid but change to prior to meals. - Standing anti-emetic - Compazine , also prior to meals. - Minimize opiod therapy as she is already doing - Consider formal SPGES once off all opiods (would need to also hold Metoclopramide  for 24 hours) - Check AM cortisol and ACTH along with thyroid function.  ATTESTATION   (FR)  I personally saw and evaluated the patient, and participated in the management and treatment plan as documented in the resident/fellow note.  CHARLIE MARLA DEVONSHIRE, MD Attending Physician Division of Gastroenterology

## 2024-07-03 NOTE — Procedures (Signed)
 VAT: Place PICC order in place  After reviewing pt's chart, pt with hx of R and L IJ clot. Secure chat sent to first call Dr. Zina for an U/S to r/o clots as the PICC team will need clearance before proceeding with placing a PICC line. Awaiting an order

## 2024-07-04 NOTE — Progress Notes (Signed)
 [facility] Transplant Psychiatry Consultation & Liaison Service Follow up Evaluation   IDENTIFICATION  Pt Name:  Kathy Lewis; MRN: [id number]: DOB: 1996/04/18 LOS:   19 days in hospital Primary Team:    Cardio-Thoracic Surgery Attending Requesting Consult:  Antonieta Darby, MD Subjective / Interval History:   Patient is a 28 y.o. female who  has a past medical history of Anxiety, Bipolar 2 disorder (CMS/HHS-HCC), CHF (congestive heart failure) (CMS/HHS-HCC) (N/A), Diabetes mellitus type 1 (CMS/HHS-HCC), ESRD (end stage renal disease) (CMS/HHS-HCC), Gastroparesis, History of CVA (cerebrovascular accident) without residual deficits, History of internal jugular thrombosis, History of pre-eclampsia, History of tobacco use, History of UTI, HLD (hyperlipidemia), HSV infection, HTN (hypertension), transfusion of whole blood, NICM (nonischemic cardiomyopathy) (CMS/HHS-HCC), and Personal history of pulmonary embolism. We are consulted due to anxiety.  Yesterday she met with transplant psychology, appreciate their visit.  Seen alone today, mom called during visit and was en route. She reports feeling better but frustrated by still being here. She is anxious about the delays and that her daughter might have to move in with her father in [place] and change schools if she cannot get home soon. She hopes to be home by end of next week.   We reviewed her hospital indications and she has a good grasp on need for nutrition and refeeding monitoring though she hopes to avoid DHT. She is motivated and thinks she can have three boosts per day. Hopes to talk to team and RD about what she can do to avoid a tube. Understands PICC for phos supp and monitoring, and has a better understanding now of why it needs monitoring.  Barrier to eating has been nausea and bad tasting food, and low prior appetite. With goals in place she feels she can meet her nutritional needs. She expresses value in clear  communication with emphasis on what she herself can do to advance care plan.  Her anxiety episodes have lessened in her view, and she feels it will be better once home and with daughter. Her daughter is 4 and knows she got a new heart.    MEDICATIONS   Hospital Administered Medications: Scheduled Medications   amphotericin B LIPOSOME, 24 mg, Nebulization, Weekly 1400 busPIRone, 5 mg, Oral, BID CC cholecalciferol, 50,000 Units, Oral, Weekly escitalopram  oxalate, 10 mg, Oral, Daily famotidine , 10 mg, Oral, BID ganciclovir (CYTOVENE) adult IV, 2.5 mg/kg, Intravenous, Q24H heparin  (porcine), 5,000 Units, Subcutaneous, Q8H SCH insulin  LISPRO, 0-20 Units, Subcutaneous, TID CC magnesium  sulfate, 4 g, Intravenous, Once Medication Message, , XX, ACHS+[id number] melatonin, 3 mg, Oral, QHS metoclopramide  (REGLAN ) IV, 5 mg, Intravenous, TID2HPC mycophenolate, 540 mg, Oral, Q12H SCH Medication Message, 1 each, XX, As Directed Admin OLANZapine, 5 mg, Oral, QHS polyethylene glycol, 17 g, Oral, BID predniSONE, 15 mg, Oral, Daily sennosides-docusate, 2 tablet, Oral, BID sodium phosphate  IV (ADULT peripheral), 30 mmol, Intravenous, Once sulfamethoxazole-trimethoprim, 1 tablet, Oral, Daily tacrolimus, 2 mg, Oral, QHS tacrolimus, 3 mg, Oral, Daily thiamine, 100 mg, Oral, Daily     Infusions   lactated Ringers       PRN Meds   acetaminophen , 650 mg, Oral, Q6H PRN bisacodyL , 10 mg, Rectal, Daily PRN dextrose  50% in water , 12.5-25 g, Intravenous, As Directed glucagon , 1 mg, Intramuscular, As Directed hydrOXYzine  (ATARAX ,VISTARIL ) oral, 25 mg, Oral, Q6H PRN insulin  LISPRO, 1-10 Units, Subcutaneous, As Directed lidocaine , 0.5 mL, Subcutaneous, As Directed methocarbamoL , 500 mg, Oral, TID PRN oxyCODONE , 5-10 mg, Oral, Q4H PRN simethicone , 80 mg, Oral, QID PRN  PHYSICAL AND MENTAL STATUS EXAM     Current Vital Signs 24h Vital Sign Ranges  T 36.8 C (98.2 F) (07/04/24 0800)  Temp  Avg: 36.8 C (98.3 F)  Min: 36.7 C (98 F)  Max: 36.9 C (98.5 F)  BP 103/61 (07/04/24 0800) BP  Min: 103/61  Max: 133/99  HR 110 (07/04/24 0800) Pulse  Avg: 97.8  Min: 89  Max: 110  RR 16 (07/04/24 0800) Resp  Avg: 17.7  Min: 16  Max: 24  O2sat 98 % Nasal cannula (07/02/24 0400) SpO2  Avg: 98.3 %  Min: 96 %  Max: 100 %       Current Shift Ins/Outs 24h Total Ins/Outs  Time Ins Outs No intake/output data recorded. 11/06 0701 - 11/07 0700 In: 360 [P.O.:360] Out: 2310 [Urine:2050; Drains:260]       Weights   First 62.6 kg (138 lb 0.1 oz) (06/15/24 1534)   Last 58.6 kg (129 lb 3 oz) (07/04/24 9377)    Physical Exam: General: nontoxic appearing  female, in hospital garb in bed Neuro/MSK:  normal posture, normal tone, moving all extremities without difficulty, no abnormal movements, no tremor  Mental Status Evaluation: Appearance:  Well appearing, in hospital garb  Behavior:  Calm cooperative pleasant  Speech:  normal rate, normal volume  Language: Fluent, no aphasia/paraphrasias noted  Mood:  Better  Affect:  Euthymic  Thought Process:  Linear, logical, coherent, future-oriented   Thought Content:  No voiced SHI, no overt delusions  Associations: Intact, not tangential or loose  Orientation:  x4  Attention and concentration: Grossly intact to interview on bedside interview  Fund of knowledge: average      Recent and remote memory: Grossly intact to interview   Cognition:  Grossly intact to interview  Insight and Judgment:  Fair / Fair      DATA   Labs were reviewed are are notable for:   Labs CBC (WBC, Hgb, Hct, PLTS, MCV, x 3) Recent Labs  Lab 07/03/24 0627 07/03/24 1915 07/04/24 0522  WBC 5.0 6.9 3.5  HGB 9.2* 11.0* 10.1*  HCT 29.3* 35.4 31.6*  PLT 301 262 262  MCV 101* 100* 100*    Auto Diff: (Neutro, Lymphs, Eos, Basos) No results for input(s): NEUTOPHILPCT, LYMPHOPCT, EOSPCT, BASO in the last 168 hours.    Recent Labs  Lab 07/03/24 0626  07/03/24 1914 07/04/24 0522  NA 140 139 137  K 4.6 4.9 4.2  CL 109* 105 109*  CO2 23 22 22   BUN 7 7 4*  CREATININE 1.2* 1.4* 1.2*  GLUCOSE 77 203* 150*  CALCIUM  8.3* 8.4* 8.2*   Recent Labs  Lab 07/03/24 1914  ALKPHOS 116*  ALT 13  AST 24    Cardiology: Recent Labs  Lab 06/28/24 0837 06/29/24 0857 06/30/24 0725 07/01/24 0729 07/02/24 0857  VENTRATE 105 107 110 91 107  PRINTERVAL 124 128 134 118 120  QRSINTERVAL 60 60 58 58 64  QTINTERVAL 344 330 340 374 354  QTC 454 440 460 460 472    DIAGNOSES    Anxiety Postpartum cardiomyopathy s/p OHT Kidney transplant recipient PNES, provisional  ASSESSMENT/PLAN   28 y.o. woman with postpartum cardiomyopathy who underwent heart-kidney transplant on 06/16/24 and remains in index admission. Psychiatry consulted for anxiety. She has a history of nonepileptic spells previously evaluated at [facility]. She likely has a component of overlaid PNES-panic exacerbated by undertreated anxiety. Benzos have seemed mostly correlated with sedation, and would avoid further reinforcing of episodes with pharmacologic  avoidance.  She has voiced some improvement over time, riding the wave, with decrease in frequency and intensity of events. We have spent time processing her anxiety, high risk for being primed to be anxious, multiple triggers here. Increased Lexapro  and added buspirone to augment anxiety regimen. GI commented that buspirone could increase fundus accommodation and increase nausea. Worth clarifying if they mean it could be helpful. It is a new med here and her n/v issues seem comparable to preadmission, so I'm not sure it worsened things but if nutrition remains a barrier due to this we can stop it, defer to GI/cards. Her olanzapine can help with nausea too.  Discussed with patient at length about hospital indications and nutrition goals. Discussed her questions with primary team and RD who will also talk to patient about discharge  needs/goals.   Medications Lexapro  10 mg daily Buspirone 5 mg BID Olanzapine 5 mg nightly Hydroxyzine  25 mg q6h prn anxiety Behavioral Delirium precautions Verbal redirection with episodes as able Chaplain involvemenet Followup Psych to follow if in house Recommend she reengage with mental health care in community       Thank you for allowing psychiatry to be involved in the care of this patient.  Please call the Transplant Psychiatry virtual pager at [phone] with any questions or concerns.  11:35 AM 07/04/2024    Attestation Statement:   I personally performed the service. (TP)  CAMERON ADINE KRONE, MD

## 2024-07-04 NOTE — Progress Notes (Signed)
 Endocrinology Progress Note    Date of Consult: 06/16/2024  Reason for Consultation: T1DM s/p OHT, going back to OR for DDKT    Kathy Lewis is a 28 y.o. female with past medical history significant for Type 1 Diabetes Mellitus (Hgb A1c 7% on 03/31/24- though not reliable given extent of ESRD) c/b gastroparesis, presumed peripartum NICM (dx 2021) w/ severe LV dysfunction, ESRD (previously on PD, switched to iHD in December), GERD, hypercholesterolemia, bipolar disorder, anxiety, depression, who presented to [facility] on 10/19 for planned transplant. Now s/p OHT on 10/19. Planned for DDKT today, 10/20. Endocrinology has been consulted for postoperative glycemic control.    --10/20: R DDKT    Diagnosed:  Diagnosed age 78   Previous medications/tolerance:  None   Current home regimen:  Omnipod 5 Humalog  Insulin  Pump  --Basal 0.5 units --ICR 1:6 --Target BG 120  --ICF 50  Glucose monitoring: Dexcom G7 CGM  Hypoglycemia:  Has hypoglycemia awareness. Usually has lows ~4-5am. Typically occurs when she hasn't eaten much the day before   Outpatient provider/clinic: [facility], Harlene Bill PA    ==================================== Interval History: - D10 gtt increased to 18mL/hr this morning > now off at noon per our request. Started on LR for hydration d/t limited PO. BG at noon 425 suspect 2/2 D10  - GI consulted d/t c/f gastroparesis. Recommended sending TSH and am cortisol d/t hypothyroidism and AI possibly contributing to N/V.  - Continues on pred 15 daily  - Patient has been managing insulin  pump in manual mode d/t removing sensor yesterday since nursing checking fingersticks. ====================================   Current inpatient diet:  Oral Supplements - Adult All Supplements; Boost Very High Calorie-Creamy Strawberry; Breakfast DIET NPO Except for: MEDS; Sterile Water  Protocol; Immunocompromised Effective Now   Current  Facility-Administered Medications  Medication Dose Route Frequency Provider Last Rate Last Admin  . acetaminophen  (TYLENOL ) tablet 650 mg  650 mg Oral Q6H PRN Flo Fonda Cornet, MD   650 mg at 07/03/24 0953  . amphotericin B LIPOSOME (AMBISOME) 4 mg/mL nebulizer solution 24 mg  24 mg Nebulization Weekly 1400 Ogundiran, Faithful Temitayo, MD   24 mg at 06/27/24 1559  . bisacodyL  (DULCOLAX) suppository 10 mg  10 mg Rectal Daily PRN Ogundiran, Faithful Temitayo, MD   10 mg at 07/02/24 1639  . busPIRone (BUSPAR) tablet 5 mg  5 mg Oral BID CC Scott, Quantia Shenae, NP   5 mg at 07/04/24 0856  . cholecalciferol (VITAMIN D3) capsule 50,000 Units  50,000 Units Oral Weekly Fajardo, Johana Rocio, NP   50,000 Units at 07/03/24 1238  . dextrose  10 % infusion   Intravenous Continuous Fajardo, Johana Rocio, NP 75 mL/hr at 07/04/24 0856 Rate Change at 07/04/24 0856  . dextrose  50% in water  solution 12.5-25 g  12.5-25 g Intravenous As Directed Ogundiran, Faithful Temitayo, MD   25 g at 06/26/24 0006  . escitalopram  oxalate (LEXAPRO ) tablet 10 mg  10 mg Oral Daily Scott, Quantia Shenae, NP   10 mg at 07/04/24 0855  . famotidine  (PEPCID ) tablet 10 mg  10 mg Oral BID Fajardo, Johana Rocio, NP   10 mg at 07/04/24 0856  . ganciclovir (CYTOVENE) 175 mg in sodium chloride  0.9% 100 mL IVPB  2.5 mg/kg Intravenous Q24H Flowers, Cena Drape, NP 100 mL/hr at 07/03/24 1822 175 mg at 07/03/24 1822  . glucagon  (GLUCAGEN ) injection 1 mg  1 mg Intramuscular As Directed Ogundiran, Faithful Temitayo, MD      . hydrOXYzine  pamoate (  VISTARIL ) capsule 25 mg  25 mg Oral Q6H PRN Fajardo, Johana Rocio, NP   25 mg at 07/04/24 9276  . insulin  LISPRO (AdmeLOG , HumaLOG ) SQ Pump MEAL bolus dose 0-20 Units  0-20 Units Subcutaneous TID CC McMillan, Kristen, NP   3.1 Units at 07/03/24 1820   And  . insulin  LISPRO (AdmeLOG , HumaLOG ) SQ Pump PRN bolus dose 1-10 Units  1-10 Units Subcutaneous As Directed Veleta Neptune, NP   2 Units at  07/01/24 0426   And  . insulin  LISPRO (AdmeLOG , HumaLOG ) SQ Pump BASAL  0.025-5 Units/hr Subcutaneous Continuous Veleta Neptune, NP 0.01 mL/hr at 07/03/24 1836 1 Units/hr at 07/03/24 1836  . lidocaine  (XYLOCAINE ) 1 % injection 0.5 mL  0.5 mL Subcutaneous As Directed Glendia Lonny Guernsey, NP      . MEDICATION MESSAGE   XX [id number] Veleta Neptune, NP   Given at 07/04/24 [phone]  . melatonin disintegrating tablet 3 mg  3 mg Oral QHS Flowers, Cena Drape, NP   3 mg at 07/03/24 2051  . methocarbamoL  (ROBAXIN ) tablet 500 mg  500 mg Oral TID PRN Lee, Vincent Hsuan Ju, NP   500 mg at 06/30/24 1929  . metoclopramide  (REGLAN ) injection 5 mg  5 mg Intravenous TID2HPC Flowers, Cena Drape, NP   5 mg at 07/04/24 0857  . mycophenolate (MYFORTIC) DR tablet 540 mg  540 mg Oral Q12H SCH Scott, Quantia Shenae, NP   540 mg at 07/04/24 0904  . No NSAIDS  1 each XX As Directed Admin Ogundiran, Faithful Temitayo, MD      . OLANZapine (ZyPREXA) tablet 5 mg  5 mg Oral QHS Gregorio Longs [place], [place]   5 mg at 07/03/24 2051  . oxyCODONE  (ROXICODONE ) immediate release tablet 5-10 mg  5-10 mg Oral Q4H PRN Gregorio Longs [place], [place]   10 mg at 07/02/24 0010  . polyethylene glycol (MIRALAX ) packet 17 g  17 g Oral BID Ogundiran, Faithful Temitayo, MD   17 g at 07/04/24 0904  . predniSONE (DELTASONE) tablet 15 mg  15 mg Oral Daily Fajardo, Johana Rocio, NP   15 mg at 07/04/24 0856  . sennosides-docusate (SENOKOT-S) 8.6-50 mg tablet 2 tablet  2 tablet Oral BID Ogundiran, Faithful Temitayo, MD   2 tablet at 07/04/24 0855  . simethicone  (MYLICON) chewable tablet 80 mg  80 mg Oral QID PRN Clise, Lauren Dionne, PA   80 mg at 06/28/24 0420  . sulfamethoxazole-trimethoprim (BACTRIM SS) 400-80 mg tablet 80 mg of trimethoprim  1 tablet Oral Daily Milano, Carmelo Alessio, MD   80 mg of trimethoprim at 07/04/24 0856  . tacrolimus (PROGRAF) capsule 2 mg  2 mg Oral QHS Wilmer Deitra Garre, NP   2 mg at 07/03/24 2051  . tacrolimus  (PROGRAF) capsule 3 mg  3 mg Oral Daily Wilmer Deitra Garre, NP   3 mg at 07/04/24 0855  . thiamine (Vitamin B-1) tablet 100 mg  100 mg Oral Daily Fajardo, Johana Rocio, NP   100 mg at 07/04/24 0856   In bed, sitting upright, awake alert and oriented. MAEx4, f/c. In NAD. Mother present at bedside.   Temp:  [36.7 C (98 F)-36.9 C (98.5 F)] 36.8 C (98.2 F) Heart Rate:  [89-110] 110 Resp:  [16-24] 16 BP: (103-133)/(61-99) 103/61  Temp (24hrs), Avg:36.8 C (98.3 F), Min:36.7 C (98 F), Max:36.9 C (98.5 F)  Weight: 58.6 kg (129 lb 3 oz)    Recent Labs  Lab 07/03/24 0626 07/03/24 0627 07/03/24 1914 07/03/24 1915 07/04/24 0522  WBC  --  5.0  --  6.9 3.5  HGB  --  9.2*  --  11.0* 10.1*  HCT  --  29.3*  --  35.4 31.6*  PLT  --  301  --  262 262  NA 140  --  139  --  137  K 4.6  --  4.9  --  4.2  CL 109*  --  105  --  109*  CO2 23  --  22  --  22  BUN 7  --  7  --  4*  CREATININE 1.2*  --  1.4*  --  1.2*     Lab Results  Component Value Date   GFR 63 07/04/2024   GFR 53 07/03/2024   GFR 63 07/03/2024    Lab Results  Component Value Date   HGBA1C 6.9 (H) 06/15/2024   HGBA1C 7.0 (H) 03/31/2024   HGBA1C 7.0 (!) 03/31/2024    Lab Results  Component Value Date   TSH 1.33 03/31/2024   T4FREE 1.04 07/03/2024    Recent Labs    07/03/24 0506 07/03/24 0805 07/03/24 0830 07/03/24 0850 07/03/24 1120 07/03/24 1656 07/03/24 2041 07/03/24 2353 07/04/24 0357 07/04/24 0819  POCGLU 96 52* 70 116 136 225* 157* 160* 164* 222*        Assessment & Plan   Kathy Lewis is a 28 y.o. female with Type 1 Diabetes and steroid induced hyperglycemia now s/p OHT on 10/19, DDKT 10/20. Endocrinology following for assistance with glycemic control.   D10 gtt increased this morning per cardiology presumably d/t planned NPO for PICC line (had already been having intermittent BGs 200s on D10 at 84mL/hr). PICC placement canceled per cardiology. Endocrinology recommending  D10 gtt discontinuation. D10 not stopped until noon per charting. BG at noon 425 with D10 on board. Now on LR at 50mL/hr for hydration given limited intake last few days.   Seen eating lunch this afternoon. Reports appetite improved today. There are recommendations for enteral nutrition per RD if PO intake not consistent: Nutren 1.5 at 10 mL/hr and advance by 10 mL q8h towards a goal rate of 40 mL/hr x 24 hrs. Will provide 169g CHO when running at goal.  Patient informed endocrine she has been managing her pump in manual mode for last 24hrs d/t frequency of POC checks by nursing staff. She felt sensor/CGM was superfluous given fingersticks. Recommended replacing dexcom sensor and keeping pump in auto mode. Adjusting POC orders as below.   Continues with pump for now, but low threshold to transition off pump and use IV gtt if needed if worsening pseudoseizures  With regards to c/f AI vs hypothyroidism contributing to N/V: GI team consulted yesterday d/t c/f gastroparesis. Recommendations to send cortisol and TSH to r/o other causes for N/V. Labs not yet sent. D/w cardiology that cortisol level will likely be of low yield clinically d/t prolonged steroid use following transplant. A FT4 was sent yesterday and was normal, making primary hypothyroidism less likely. Patient also does not have other overt symptoms of AI or Hypothyroidism.    Inpatient Recommendations: Omnipod 5 in Automode with Humalog  insulin   Basal 0.6 units/hr ICR 1 unit/6 carbs  ISF 50 Target 120 IOB 2h   Glucose monitoring ACHS (please check POC BG once on dayshift and once on night shift. Patient may refuse, but please document refusal). + Record Dexcom sensor readings on medication message (have added [facility] so we can view on Clarity)   Discharge  planning   Patient is s/p OHT/DDKT. Please do not start SGLT2i or GLP1-RA for at least 6 months following transplant per cardiology protocol.   Patient may resume pump and  CGM. Will evaluate if she needs new glucometer/testing supplies.   Follow up with PCP vs [facility] pending needs at time of discharge.   ====================================  Endocrinology is available from 8A-5P. If assistance is needed outside of these hours, nursing should call the primary team first call for patient management. If further guidance is needed for urgent concerns, the primary team first call should contact the Endocrine fellow on call.   AMBER LYNN MCVEY, NP   I spent a total of 50 minutes in both face-to-face and non-face-to-face activities, excluding procedures performed, for this visit on the date of this encounter.

## 2024-07-04 NOTE — Progress Notes (Signed)
 Heart Transplant Coordinator Note  Kathy Lewis is a 28 y.o. female s/p OHT on 06/16/24 and DDKT on 10/20.   CMV D: positive / R: positive EBV D: negative / R: positive Toxoplasmosis D: negative / R: negative  Increased Risk Donor: no Donor HCV Ab: negative Donor HCV NAT:  negative Donor HBV NAT: negative Donor Anti-HBsAG:  negative Donor Anti-Hbc: negative  Crossmatch results: T Cell Negative and B Cell Negative  PGD: no  Continues in patient.   EMBx: #1 EMBx 10/27: 0R/pAMR 0 #2 EMBx 11/4: 1R/pAMR 0 #3 EMBx due ~11/11  Discharge Planning:  Patient/family education: TBD Outpatient medication plan: TBD Transplant clinic appointments: pending clinical course  [name] RN Heart Transplant Coordinator  [phone]

## 2024-07-08 NOTE — Progress Notes (Signed)
 CARDIAC CATHETERIZATION PRE-PROCEDURAL NOTE  NAME: Kathy Lewis MRN: [id number] DOB: 13-Sep-1995 AGE: 28 y.o.  PLANNED PROCEDURE: Cardiac catheterization ALTERNATIVE OPTIONS: No catheterization PROCEDURAL INDICATIONS: Evaluation and/or treatment of OHT.   ANESTHESIA: Moderate sedation PLAN FOR BLOOD PRODUCTS: Transfusion not anticipated ADDITIONAL NEEDS: None  POST-PROCEDURE PLAN: Monitored bed CONSENT OBTAINED: Self   HISTORY OF PRESENT ILLNESS:    I have reviewed the medical and surgical history, family history, review of systems, date of last menses (if applicable), current medications, allergies and sensitivities, physical examination, laboratory and diagnostic data reviewed and recorded on the H&P form.    Kathy Lewis is a 28 y.o. female with medical history of NiCM, ESRD on iHD, Type 1 DM, HTN, HLD now s/p OHT & DDKT (06/16/2024) c/b PNES and anxiety.     MEDICATIONS   Allergies: Cantaloupe, NSAIDs Antiplatelet: None Anticoagulation: None  Scheduled Meds: . amphotericin B LIPOSOME  24 mg Nebulization Weekly 1400  . busPIRone  5 mg Oral BID CC  . cholecalciferol  50,000 Units Oral Weekly  . escitalopram  oxalate  10 mg Oral Daily  . famotidine   10 mg Oral BID  . ganciclovir (CYTOVENE) adult IV  2.5 mg/kg Intravenous Q24H  . heparin  (porcine)  5,000 Units Subcutaneous Q8H SCH  . insulin  LISPRO  0-20 Units Subcutaneous TID CC  . Medication Message   XX ACHS+[phone]  . Medication Message   XX ACHS  . melatonin  3 mg Oral QHS  . [Held by provider] metoclopramide  (REGLAN ) IV  5 mg Intravenous TID2HPC  . mycophenolate  360 mg Oral Q12H SCH  . Medication Message  1 each XX As Directed Admin  . OLANZapine  5 mg Oral QHS  . pantoprazole   20 mg Oral Daily  . predniSONE  15 mg Oral Daily  . sennosides-docusate  2 tablet Oral BID  . sulfamethoxazole-trimethoprim  1 tablet Oral Daily  . tacrolimus  2 mg Oral QHS  . tacrolimus  3 mg Oral Daily  . thiamine   100 mg Oral Daily   Continuous Infusions: . insulin  LISPRO 1 Units/hr (07/07/24 1851)   PRN Meds:.acetaminophen , bisacodyL , dextrose  50% in water , glucagon , HYDROmorphone , hydrOXYzine  (ATARAX ,VISTARIL ) oral, insulin  LISPRO **AND** insulin  LISPRO **AND** insulin  LISPRO, lidocaine , methocarbamoL , ondansetron , polyethylene glycol, simethicone     OBJECTIVE    Current Vital Signs 24h Vital Sign Ranges  T 36.7 C (98.1 F) (07/08/24 0334) Temp  Avg: 36.9 C (98.4 F)  Min: 36.6 C (97.8 F)  Max: 37.2 C (98.9 F)  BP 106/69 (07/08/24 0334) BP  Min: 104/72  Max: 118/82  HR 77 (07/08/24 0334) Pulse  Avg: 87.1  Min: 77  Max: 98  RR 16 (07/08/24 0334) Resp  Avg: 17.1  Min: 16  Max: 18  O2sat 99 % Nasal cannula (07/02/24 0400) SpO2  Avg: 98.3 %  Min: 97 %  Max: 100 %    PHYSICAL EXAM: Pulses: 2+ radial and 2+ femoral bilaterally CV RRR Able to lay flat  MALLAMPATI CLASSIFICATION: III (soft and hard palate and base of uvula visible) ASA CLASSIFICATION: II - Mild disease   LABORATORY STUDIES:  Recent Labs  Lab 07/05/24 0601 07/06/24 0626 07/07/24 0523  NA 140 137 139  K 4.2 4.2 4.3  CL 112* 106 108  CO2 24 22 23   BUN 6* 8 9  CREATININE 1.2* 1.1* 1.1*  GLUCOSE 83 82 140  CALCIUM  8.0* 8.2* 8.3*   Recent Labs  Lab 07/03/24 1914  AST 24  ALT 13  ALKPHOS 116*  TBILI 0.9     Recent Labs  Lab 07/06/24 0626 07/07/24 0523 07/08/24 0534  WBC 3.7 3.4 3.7  HGB 10.3* 9.5* 9.2*  HCT 32.9* 29.8* 30.1*  PLT 232 217 209   No results for input(s): APTT, INR in the last 168 hours.  Recent Labs  Lab 06/15/24 1838 06/15/24 2215 06/16/24 0233 06/16/24 1812 06/17/24 0212  INR 0.9 1.0  --  1.0 1.0  APTT 32.2 34.8  --  29.6  --   FIB  --   --    < > 274  --    < > = values in this interval not displayed.        PERTINENT IMAGING STUDIES   ECG:    Echocardiogram: NORMAL LEFT VENTRICULAR SYSTOLIC FUNCTION WITH MILD LVH ESTIMATED EF: >55%, CALC EF(3D): 61% DIASTOLIC  FUNCTION CAN'T BE DETERMINED NORMAL RIGHT VENTRICULAR SYSTOLIC FUNCTION VALVULAR REGURGITATION: No AR, TRIVIAL MR, TRIVIAL PR, TRIVIAL TR                         NO VALVULAR STENOSIS                                                                        SMALL PERICARDIAL EFFUSION  ------------------------------------------------------------------------------------------ SMALL TO MODERATE-SIZED PREDOMINANTLY ANTERIOR PERICARDIAL EFFUSION WITH NO               ECHOCARDIOGRAPHIC SIGNS OF TAMPONADE   Cardiac catheterization: 07/01/2024 RA 4, RV 36/1, PA mean 26, PCWP 13, CI 3.2  Access: Femoral  Sedation: Fentanyl  50, Midaz 1.5   YOO JIN KIM 07/08/2024 6:22 AM

## 2024-07-08 NOTE — Progress Notes (Signed)
 Cardiology Progress Note    07/08/2024 Hospital Day: 38  Kathy Lewis is a 28 y.o.female with a PMH NICM, DM1, ESRD, HTN, HL, and gut/brain axis disorder who presented with organ offers. Underwent OHT/DDKT 10/20 (index admission). Post-op course complicated by thrombocytopenia (resolved), GI complaints (improving), bilateral IJ thrombi, and severe debilitating anxiety. Planning for home ~11/13.   Subjective:   Feeling well this morning. Excited for home this week. Daughter visiting.   Objective:   Vital signs in last 24 hours: Temp:  [36.7 C (98 F)-37.2 C (98.9 F)] 36.7 C (98 F) Heart Rate:  [77-92] 87 Resp:  [16-20] 18 BP: (105-116)/(69-75) 115/75 SpO2: 99 % Last BM Date: 07/04/24  Weights: Last weight: 64 kg (141 lb 1.5 oz)    First weight: 62.6 kg (138 lb 0.1 oz) BMI: Body mass index is 27.56 kg/m.  24-Hour Intake/Output: I/O last 2 completed shifts: In: 1120 [P.O.:620; IV Piggyback:500] Out: 1320 [Urine:1050; Drains:270]  Telemetry: SR   Physical Exam:  General: alert, cooperative, and in NAD Respiratory: regular rate, symmetric, unlabored, clear to auscultation bilaterally, and no accessory muscle use Cardiac: regular rate, regular rhythm, S1, S2 present, no murmur, no rub, no gallop, and JVD non-elevated. Sternal incision with staples in place  Abdomen: normal bowel sounds, soft, nontender, and nondistended. JP drain in place, abdominal staples  Extremities: extremities warm and well perfused, no clubbing or cyanosis, no edema, and distal pulses intact Lines: PIV  Labs: BMP: Recent Labs  Lab 07/08/24 0534  NA 138  K 5.4*  CL 109*  CO2 21  BUN 15  CREATININE 1.2*  GLUCOSE 154*  CALCIUM  7.9*  MG 1.9   CBC: Recent Labs  Lab 07/08/24 0534  WBC 3.7  HGB 9.2*  HCT 30.1*  PLT 209   INR: No results for input(s): INR in the last 168 hours.  FK: Recent Labs  Lab 07/08/24 0534  FK506 6.0   CYA: No results for input(s): CYA in the  last 73719 hours. LDH: No results for input(s): LDH in the last 168 hours.       Scheduled Medications: .  acetaminophen , 650 mg, Oral, Q6H PRN .  [COMPLETED] amphotericin B LIPOSOME, 24 mg, Nebulization, Daily 1400 **AND** amphotericin B LIPOSOME, 24 mg, Nebulization, Weekly 1400 .  bisacodyL , 10 mg, Rectal, Daily PRN .  busPIRone, 5 mg, Oral, BID CC .  cholecalciferol, 50,000 Units, Oral, Weekly .  dextrose  50% in water , 12.5-25 g, Intravenous, As Directed .  escitalopram  oxalate, 10 mg, Oral, Daily .  famotidine , 10 mg, Oral, BID .  ganciclovir (CYTOVENE) adult IV, 2.5 mg/kg, Intravenous, Q24H .  glucagon , 1 mg, Intramuscular, As Directed .  heparin  (porcine), 5,000 Units, Subcutaneous, Q8H SCH .  HYDROmorphone , 2-4 mg, Oral, Q6H PRN .  hydrOXYzine  (ATARAX ,VISTARIL ) oral, 25 mg, Oral, Q6H PRN .  insulin  LISPRO, 0-20 Units, Subcutaneous, TID CC **AND** insulin  LISPRO, 1-10 Units, Subcutaneous, As Directed **AND** insulin  LISPRO, 0.025-5 Units/hr, Subcutaneous, Continuous .  lidocaine , 0.5 mL, Subcutaneous, As Directed .  Medication Message, , XX, [phone] .  Medication Message, , XX, ACHS .  melatonin, 3 mg, Oral, QHS .  methocarbamoL , 500 mg, Oral, TID PRN .  mycophenolate, 360 mg, Oral, Q12H SCH .  Medication Message, 1 each, XX, As Directed Admin .  OLANZapine, 2.5 mg, Oral, QHS .  ondansetron , 4 mg, Oral, Q8H PRN .  pantoprazole , 20 mg, Oral, Daily .  polyethylene glycol, 17 g, Oral, BID PRN .  predniSONE, 15  mg, Oral, Daily .  sennosides-docusate, 2 tablet, Oral, BID .  simethicone , 80 mg, Oral, QID PRN .  sodium phosphate  IV (ADULT peripheral), 45 mmol, Intravenous, Once .  sulfamethoxazole-trimethoprim, 1 tablet, Oral, Daily .  tacrolimus, 3 mg, Oral, Daily .  tacrolimus, 3 mg, Oral, QHS .  thiamine, 100 mg, Oral, Daily  Assessment/Plan:   Principal Problem:   S/P orthotopic heart transplant (CMS/HHS-HCC) Active Problems:   Type 1 diabetes mellitus  (CMS/HHS-HCC)   Hypercholesterolemia   Kidney transplanted (HHS-HCC)   Immunosuppressed status (HHS-HCC)   Postoperative anemia   Nausea   History of internal jugular thrombosis   Protein-calorie malnutrition, severe (HHS-HCC)   # NICM s/p OHT 06/16/24 # HTN # HL  Thymo induction. Negative T&B cell crossmatch. No graft dysfunction or rejection.  - RHC/EMB 3+1 11/11; of note, will need L femoral access given kidney transplant on the R and bilateral IJ thrombi  - FK level 6 11/11 (goal 8-12); increase dose, levels daily   - Continue lower dose Myfortic given nausea/GI issues. Will consider up-titrating in a few days  - Continue prednisone  - Continue PPI - Start ASA, rosuvastatin , calcium -vit D, and magnesium  at discharge - PCP/Toxo (low risk toxo status): Bactrim x1 year (end 06/16/2025) - CMV: intermediate risk but received ATG induction. Continue ganciclovir, Valcyte x6 months at discharge (end 12/15/2024) - Candidiasis: Stopped Nystatin given persistent nausea - Aspergillus: inhAmpho weekly until discharge  - Have reached out to CTS to assess if sternal staples can be removed     # ESRD s/p DDKT 06/16/2024 # Ureteral stent in place # Abdominal JP drain in place  # Hypophosphatemia # Abnormal PTH Making urine with normal creatinine. - Abdominal transplant team following, appreciate their assistance  - Immunosuppression/prophylaxis as above  - Replete phosphorus 11/11; has required nearly daily repletion  - Remove abdominal staples 11/11 - JP drain remains in place, tentative plan to discharge with JP drain in place given ongoing high output - Ureteral stent to be removed outpatient in ~6 weeks (~12/1)  # Vitamin D  deficiency - Cholecalciferol 50000 units weekly x8 (end 12/25)  # AMS, resolved  # Anxiety # Psychogenic nonepileptic seizures exacerbated by under-treated anxiety Episode of aphasia and lethargy 10/30. Brain imaging negative. Ongoing anxiety throughout  post-operative phase. - Psychiatry following, appreciate their assistance  - Continue Lexapro  - Continue Buspar  - Half olanzapine 11/11; plan to stop at discharge   - PRN hydroxyzine  - Chaplain and stress management have seen     # Type I DM A1c 6.9% 05/2024.  - Endocrine following and assisting with insulin  pump   # Nausea/vomiting, improving  # Protein malnutrition Improved with reduction of MMF/transition to Myfortic. Ongoing intermittent nausea, but tolerating oral intake and medications. GI signed off. Recommended stopping Buspar as may be contributing to N/V. However, given significant anxiety issues this admission, felt benefit outweighed risks so continued Buspar. No other workup or interventions indicated.  - PRN oral Zofran   - Continue PPI and famotidine  - PRN simethicone   - Thiamine x7 days with concern for refeeding (11/6-11/12)  # Bilateral IJ thrombus  - Defer systemic AC at this time given frequent biopsies, will consider outpatient once less frequent   # Post-operative pain  - PRN Tylenol  - PRN Robaxin   - PRN oral Dilaudid ; bowel regimen with scheduled Senokot and PRN Miralax  and bisacodyl  suppository    Comorbid Conditions: Nutritional Disorders:    Hypoalbuminemia:  Hypoalbuminemia present with lowest albumin  of 2.3.  Hypoalbuminemia is associated with increased risk for patients.  We will attempt to treat the underlying condition(s) contributing to this low albumin  state. Electrolyte Disorders:    Hyperkalemia:  Hyperkalemia present with highest potassium of 5.4.  Will continue to monitor.     Hypocalcemia:  Hypocalcemia present with lowest calcium  of 7.9.  Will continue to monitor.     Hypomagnesemia:   Magnesium  supplement administered.  Will continue to monitor.   Hematologic Disorders:    Anemia:  Anemia present with lowest hemoglobin of 9.2.  Will continue to monitor.          Code Status: Full Code VTE Prophylaxis:  VTE Prophylaxis  +  Anticoagulant & Anti-Embolism Compression Device Ordered  heparin  (porcine), 5,000 Units, Subcutaneous, Q8H SCH, 5,000 Units at 07/08/24 0931     Discharge Planning: pending clinical course   VERMELL SCOTLAND, NP  ------------------------------------------------------------------------------- Attestation signed by Tobie Nanci Baptise, MD at 07/08/2024  4:23 PM Attestation Statement:   I personally saw the patient and performed a substantive portion of the medical decision making, in conjunction with the Advanced Practice Provider for the condition/treatment of s/p OHT + DDKT Feels better Anxiety seems better controlled Reduced Myfortic back to 360mg  BID given the nausea; We will continue to titrate over the next couple of weeks once we are confident we can tolerate from a  GI standpoint FK titration as above Dc planning   CHETAN B PATEL, MD -------------------------------------------------------------------------------

## 2024-07-08 NOTE — Care Plan (Signed)
  Problem: Cognitive: Goal: Patient will maintain baseline neurological and functional status. Outcome: Progressing   Problem: Respiratory: Goal: Patient will maintain adequate gas exchange. Outcome: Progressing   Problem: Immunosupression: Goal: Patient will not have occurrences of rejection or active infection. Outcome: Progressing

## 2024-07-08 NOTE — Progress Notes (Signed)
      Sternal incision clean, dry, intact without erythema or drainage.  Chest staples and sutures removed without complication.

## 2024-07-10 ENCOUNTER — Telehealth (HOSPITAL_COMMUNITY): Payer: Self-pay

## 2024-07-10 NOTE — Telephone Encounter (Signed)
 Pt called wanting to schedule for cardiac rehab, I advised pt that she would have to complete her cardiology follow up which is 11/17@8  and we will f/u with her after she has been cleared to participate.

## 2024-07-13 NOTE — ED Provider Notes (Signed)
 ------------------------------------------------------------------------------- Attestation signed by Elsie Toribio Kleine, MD at 07/17/2024  9:55 PM I have reviewed the case with the resident and agree with plan as above. I discussed the case with transplant surgery at [facility]. Plan for close followup the following day. Hold Myfortic.  -------------------------------------------------------------------------------  Transfer of Care Note I assumed care of Kathy Lewis on 07/13/2024 at 7:13 AM. Please refer to the original provider's note for additional information regarding the care of Kathy Lewis. I agree with their history, physical exam and plan.  Past medical history includes: Medical History[1]  Medical Decision Making: Briefly, Kathy Lewis is a 28 y.o. female who initially presented to the emergency department as per below.   ED Course as of 07/13/24 1021  Sun Jul 13, 2024  0706 Recent heart + kidney transplant last week, unable to tolerate po/meds since DC. L chest pain. Was symp in hospital but improved briefly. B/l internal jugular clots deferred tx. Hypomg/phos repleted [ ]  transfer, ask [facility] transplant team about if they want imaging? - patient wants to leave AMA, was talked to [AD]  0706 S discharged Fri transplant heart/kidney, n/v since before discharge, L sharp/pleuritic CP, bilateral IJ clots with deferred AC due to need for multiple biopsies, mag/phos repleted, bicarb 19, symptomatically improved, wanting to leave AMA - admit (to [facility]) to ensure she can take immunosuppressants - talk to [facility] re need for imaging? Transfer? [TL]  [id number] Per conversation with [facility] attending: recommend PO trial - if passes, can hold myfortic and f/u. Do not recommend further imaging at this time. [TL]  [id number] Noted that patient had several measurements with systolics less than 100.  BP cuff loose.  Placed to smaller cuff, multiple BPs with systolics in 110s. [TL]  1020 Glu 56  improved to 133 with orange juice [TL]    ED Course User Index [AD] Isaiah Lynnann Minion, MD [TL] Adolphus Lorane Double, MD     Current Plan at Transition of Care: Discussion with [facility] regarding need for imaging for transfer  Reassessment: Vital Signs:  The most current vitals were  Vitals:   07/13/24 0530  BP: 105/73  Pulse: 85  Resp: 16  Temp:   SpO2: 97%   Hemodynamics:  The patient is hemodynamically stable. Mental Status:  The patient is alert. PE: Normal work of breathing, lungs CTAB Cardiac RRR, no murmurs, rubs, gallops No calf swelling or tenderness  Further ED Course / Additional MDM: Discussion with [facility] attending, they believe she is stable for discharge home if she can pass p.o. trial.  Recommend holding Myfortic and they will follow-up in clinic with her.  They do not recommend further imaging, as they are not anticoagulating her due to need for multiple biopsies anyway.  Low suspicion for PE with no signs of DVT, no tachycardia, tachypnea or hypoxia.    Noted to have glucose 56, likely in the setting of poor p.o. intake.  Improved to 133 after orange juice.  No emesis. Patient discharged with prescription for Zofran  after passing p.o. trial.  Discharged in stable condition.  Post-ED Care: Discharge: Patient is felt to be medically appropriate for discharge at this time. Patient was informed of all pertinent physical exam, laboratory, and imaging findings. Patients suspected etiology of their symptom presentation was discussed with the patient and all questions were answered. Patient was instructed to follow up with their primary care doctor for re-evaluation. Patient was given strict return precautions.      Clinical Impression: 1. Nausea and  vomiting, unspecified vomiting type   2. Chest pain, unspecified type   3. Heart transplant status   4. Kidney transplant recipient (CMD)     ED Disposition     ED Disposition  Discharge   Condition  Stable   Comment   --         Patient seen in conjunction with my attending physician, Dr. Shirlean.  Electronically Signed By: Adolphus Lorane Double, MD PGY1, Emergency Medicine  07/13/2024 7:13 AM        [1] Past Medical History: Diagnosis Date  . Anxiety   . Bipolar 2 disorder    (CMD)   . CHF (congestive heart failure)    (CMD)   . Diabetes mellitus    (CMD)   . Dyslipidemia   . Gastroparesis   . Hypertension   . Major depression   . Postpartum cardiomyopathy (CMD)   . Renal disorder   . Severe anemia   . Suspected sleep apnea   . Vitamin D  deficiency

## 2024-07-15 NOTE — H&P (Signed)
 Hospital Medicine Admission History & Physical  Time of Service: 07/15/2024, 8:15 PM  PCP: Lesa Jon Jansky, PA, Phone [phone], Fax [phone]   Chief Complaint  Right sided chest pain and nausea/vomiting  History of Present Illness  Kathy Lewis is a 28 y.o. 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 Notably, [name] was just admitted from 10/19-11/4/25 and underwent OHT on 10/20. Post-op course was complicated by thrombocytopenia which resolved, GI symptoms and bilateral IJ thrombi (AC was deferred due to frequent endomyocardial biospsies. She was started on pepcid , a PPI, zofran  and phenergan  for nausea/vomiting and switched to MWF Myfortic 2/2 N/V. She was discharged on 11/14.  She presented to an OSH ED on 11/16 with N/V and difficulty taking medications. She also had a sharp chest pain radiating to her back that improved with Morphine . The ED spoke with the transplant team at that time who recommended holding Myfortic and trial of PO. Patient was feeling betters so she discharged home and followed up in transplant clinic on 11/17 for her first outpatient visit since transplant. She had an echo and endomyocardial biopsy on 11/17. Echo was without evidence of pericardial effusion and biopsy without evidence of acute rejection. She then presented to kidney transplant clinic on 11/18 with reported chest pain radiating to her back with ongoing nausea. VSS and EKG without ischemic changes. Additionally POCUS with preserved biventricular function and minimal pericardial effusion. Recommended to trial supportive care for noncardiac chest pain with Tylenol , lidocaine  patches and heating packs.  Patient presents today as a transfer from [facility] after presenting there with right sided chest pain and nausea/vomiting.  Patient reports that she has continued to have nausea almost every day since discharge.  She reports that the nausea is intermittent and not always associated with vomiting, but sometimes vomits multiple times a day.  For example she reports that on Monday she had a little bit of nausea in the morning but it was gone by the afternoon.  She is unable to discern any provoking or alleviating factors. The nausea is not associated with eating or taking medications. She did not have any associated vomiting on Monday but reports that yesterday on 11/18 she had nausea most of the day with multiple episodes of vomiting during her clinic visits.  She reports that her p.o. intake has been minimal but she has been able to continue with p.o. intake and tries to drink a boost when she takes her meds.  She reports that Zofran , Phenergan  and Compazine  have not been helpful for her nausea and that Tigan  was only helpful the 1st and 2nd time she got it last admission, but was not helpful after that.  She reports that she got Dilaudid  at the emergency room earlier in the evening which is the only thing that fully alleviated her nausea.  She notes that she has had a significant history of nausea predating her transplant and has experienced it intermittently over the past 2 years, but it has been more persistent since her transplant.  She also reports that yesterday she started to have right sided chest pain that she describes as a mix of pressure and heartburn in the right side of her chest.  She reports that she had also had this prior to her transplant but had not had this sensation of chest pain since her transplant  until yesterday.  She reports that the chest pain comes and goes and also felt that, similar to her nausea, it was fully alleviated with Dilaudid .  She reports compliance with her medications and follows the list of medications from her discharge summary to guide her in taking them every morning.  She stopped taking  Myfortic on Sunday after the ER physician reached out to the transplant team. She denies any fever/chills, diarrhea or other infectious symptoms and denies abdominal pain.  Medical History  Past Medical History Past Medical History:  Diagnosis Date  . Anxiety   . Bipolar 2 disorder (CMS/HHS-HCC)   . CHF (congestive heart failure) (CMS/HHS-HCC) N/A  . Diabetes mellitus type 1 (CMS/HHS-HCC)   . ESRD (end stage renal disease) (CMS/HHS-HCC)   . Gastroparesis   . History of CVA (cerebrovascular accident) without residual deficits   . History of internal jugular thrombosis    per chart review previously on Eliquis  for right IJ DVT  . History of pre-eclampsia   . History of tobacco use   . History of UTI   . HLD (hyperlipidemia)   . HSV infection   . HTN (hypertension)   . Hx of transfusion of whole blood   . NICM (nonischemic cardiomyopathy) (CMS/HHS-HCC)   . Personal history of pulmonary embolism     Past Surgical History Past Surgical History:  Procedure Laterality Date  . permacath insertion  08/2022  . TRANSPLANT HEART N/A 06/15/2024   Procedure: ADULT, HEART TRANSPLANT, WITH OR WITHOUT RECIPIENT CARDIECTOMY;  Surgeon: Virgia Korene Raman, MD;  Location: [facility];  Service: Cardiothoracic;  Laterality: N/A;  . TRANSPLANT KIDNEY N/A 06/16/2024   Procedure: ADULT, RENAL ALLOTRANSPLANTATION, IMPLANTATION OF GRAFT; WITHOUT RECIPIENT NEPHRECTOMY;  Surgeon: Charlanne Rounds, MD;  Location: [facility];  Service: General Surgery;  Laterality: N/A;  . CARDIAC CATHETERIZATION  N/A  . hx heart and kidney biopsies    . INSERTION PERITONEAL CANNULA/CATH FOR DIALYSIS    . OMENTECTOMY     PD catheter revision with partial omentectomy  . PERITONEAL CATHETER REMOVAL      Family History Family History  Adopted: Yes  Problem Relation Name Age of Onset  . Ovarian cancer Mother    . Heart disease Father    . Chronic kidney disease Father    . No Known Problems Sister       Social History Social History   Socioeconomic History  . Marital status: Single  . Number of children: 1  Tobacco Use  . Smoking status: Former    Current packs/day: 0.00    Average packs/day: 0.3 packs/day for 3.0 years (0.8 ttl pk-yrs)    Types: Cigarettes    Start date: 12/04/2015    Quit date: 12/04/2018    Years since quitting: 5.6  . Smokeless tobacco: Never  Vaping Use  . Vaping status: Never Used  Substance and Sexual Activity  . Alcohol use: Never  . Drug use: Not Currently    Types: Benzodiazepines    Comment: Rx'd Klonopin  prn  . Sexual activity: Not Currently    Comment: LMP- last 2022  Social History Narrative   Lives at home with her adoptive father and daughter   She has a 19 yo daughter      On disability      Caregivers would be father and mom   Social Drivers of Corporate Investment Banker Strain: Low Risk  (06/20/2024)   Overall Financial Resource Strain (CARDIA)   . Difficulty of  Paying Living Expenses: Not hard at all  Food Insecurity: No Food Insecurity (06/20/2024)   Hunger Vital Sign   . Worried About Programme Researcher, Broadcasting/film/video in the Last Year: Never true   . Ran Out of Food in the Last Year: Never true  Transportation Needs: No Transportation Needs (06/20/2024)   PRAPARE - Transportation   . Lack of Transportation (Medical): No   . Lack of Transportation (Non-Medical): No    Allergies & Medications   Allergies  Allergen Reactions  . Cantaloupe Itching and Other (See Comments)    Mouth itching  Mouth itching  Per patient she has no food or drug allergies 02/06/2024  . Nsaids (Non-Steroidal Anti-Inflammatory Drug) Other (See Comments)    Avoid post-transplant. OK for aspirin  81 mg daily.     Medications Prior to Admission Medications  Prescriptions Last Dose Taking?  DEXCOM G6 RECEIVER Misc  No  HUMALOG  KWIKPEN 100 unit/mL pen injector  No  Sig: INJECT 50 UNITS SUBCUTANEOUSLY DAILY  Patient not taking: Reported on 07/15/2024   HYDROmorphone  (DILAUDID ) 2 MG tablet  No  Sig: Take 1 tablet (2 mg total) by mouth every 8 (eight) hours as needed for moderate-to-severe pain for up to 7 days  OLANZapine (ZYPREXA) 2.5 MG tablet  No  Sig: Take 1 tablet (2.5 mg total) by mouth at bedtime  OMNIPOD 5 G6-G7 PODS, GEN 5, Crtg  No  Sig: USE AS DIRECTED EVERY 72 HOURS  acetaminophen  (TYLENOL ) 325 MG tablet  No  Sig: Take 2 tablets (650 mg total) by mouth every 6 (six) hours as needed for Pain (Do not exceed 3000mg  in 24 hours)  aspirin  81 MG EC tablet  No  Sig: Take 1 tablet (81 mg total) by mouth once daily  blood glucose diagnostic test strip  No  Sig: Use 1 test strip 3 (three) times daily  blood glucose meter kit  No  Sig: Use as directed  blood-glucose sensor (DEXCOM G7 SENSOR) Devi  No  Sig: APPLY 1 SENSOR TO SKIN EVERY 10 DAYS  calcium  carbonate-vitamin D3 (CALTRATE 600+D) 600 mg-10 mcg (400 unit) tablet  No  Sig: Take 1 tablet by mouth 2 (two) times daily with meals  cholecalciferol (VITAMIN D3) 1,250 mcg (50,000 unit) capsule  No  Sig: Take 1 capsule (50,000 Units total) by mouth once a week --last dose:  08/07/24  escitalopram  oxalate (LEXAPRO ) 10 MG tablet  No  Sig: Take 1 tablet (10 mg total) by mouth once daily  famotidine  (PEPCID ) 10 MG tablet  No  Sig: Take 1 tablet (10 mg total) by mouth 2 (two) times daily  hydrOXYzine  pamoate (VISTARIL ) 25 MG capsule  No  Sig: Take 1 capsule (25 mg total) by mouth every 6 (six) hours as needed for Anxiety  insulin  GLARGINE (LANTUS  SOLOSTAR) pen injector (concentration 100 units/mL)  No  Sig: This is for OFF PUMP PLAN ONLY- in event of insulin  pump malfunction or failure, inject 16 units glargine subcutaneously once daily.  insulin  LISPRO (HUMALOG  KWIKPEN INSULIN ) pen injector (concentration 100 units/mL)  No  Sig: This is for OFF PUMP PLAN ONLY- in event of pump failure or malfunction, inject 1 unit for every 6 grams of carbohydrates consumed immediately after eating three  times daily with meals. Maximum total daily dose 50 units.  Patient not taking: Reported on 07/15/2024  lancets  No  Sig: Use 1 each 3 (three) times daily  Patient not taking: Reported on 07/15/2024  mycophenolate (MYFORTIC) 180 MG DR tablet  No  Sig: Take 2 tablets (360 mg total) by mouth every 12 (twelve) hours  Patient not taking: Reported on 07/14/2024  ondansetron  (ZOFRAN -ODT) 4 MG disintegrating tablet  No  Sig: Dissolve 1 tablet (4 mg total) by mouth every 8 (eight) hours as needed for Nausea or Vomiting (--first line)  pantoprazole  (PROTONIX ) 20 MG DR tablet  No  Sig: Take 1 tablet (20 mg total) by mouth once daily  pen needle, diabetic 31 gauge x 3/16 needle  No  Sig: Use as directed  Patient not taking: Reported on 07/15/2024  predniSONE (DELTASONE) 5 MG tablet  No  Sig: Take 3 tablets (15 mg total) by mouth once daily  promethazine  (PHENERGAN ) 12.5 MG tablet  No  Sig: Take 1 tablet (12.5 mg total) by mouth every 8 (eight) hours as needed for Nausea or Vomiting (--second line)  rosuvastatin  (CRESTOR ) 10 MG tablet  No  Sig: Take 1 tablet (10 mg total) by mouth once daily  sennosides-docusate (SENOKOT-S) 8.6-50 mg tablet  No  Sig: Take 1-2 tablets by mouth 2 (two) times daily as needed for Constipation  sodium phosphate -potassium phosphate (K-PHOS NEUTRAL) 250 mg tablet  No  Sig: Take 2 tablets by mouth 2 (two) times daily at lunch and dinner  sulfamethoxazole-trimethoprim (BACTRIM SS) 400-80 mg tablet  No  Sig: Take 1 tablet (80 mg of trimethoprim total) by mouth once daily  tacrolimus (PROGRAF) 1 MG capsule  No  Sig: Take 4 capsules (4 mg total) by mouth every 12 (twelve) hours  valGANciclovir (VALCYTE) 450 mg tablet  No  Sig: Take 2 tablets (900 mg total) by mouth once daily    Facility-Administered Medications: None     Review of Systems  A complete review of systems was performed and is negative except as reviewed in the HPI.  Physical Exam    Current Vital Signs  24h Vital Sign Ranges  T   Temp  Avg: 36.8 C (98.3 F)  Min: 36.8 C (98.2 F)  Max: 36.9 C (98.4 F)  BP   BP  Min: 110/76  Max: 138/103  HR   Pulse  Avg: 99.5  Min: 99  Max: 100  RR   Resp  Avg: 20  Min: 20  Max: 20  O2sat     SpO2  Avg: 100 %  Min: 100 %  Max: 100 %  Weight     There is no height or weight on file to calculate BMI. General: alert, cooperative, in NAD Eyes: conjunctiva clear, anicteric sclera HENT: normocephalic, atraumatic Neck: no adenopathy, no JVD, and supple, symmetrical, trachea midline CV: regular rate and rhythm, without murmurs, rubs or gallops Resp: clear to auscultation, good air exchange Abd: soft, nontender, nondistended, normoactive bowel sounds  Abdominal JP drain in place Rectal: deferred Ext: no lower extremity edema Skin: no rashes or lesions Psych: oriented to time, place and person, mood and affect are appropriate Neuro: CN II-XII intact. Grossly normal and symmetric strength in upper and lower extremities. Intact sensation to light touch throughout. Normal coordination.  Data   Recent Results (from the past 24 hours)  ECG 12-lead   Collection Time: 07/15/24 11:03 AM  Result Value Ref Range   Vent Rate (bpm) 104    PR Interval (msec) 180    QRS Interval (msec) 58    QT Interval (msec) 334    QTc (msec) 439     EKG: Sinus tachycardia. Left atrial enlargement. Right axis deviation  RSR' in V1 or V2.  Nonspecific T wave abnormalities, anteroseptal leads. Low voltage throughout. Abnormal ECG   When compared with ECG of 02-Jul-2024 08:57,  Prolonged QT is now absent   Radiology Studies on Admission: No results found.  Assessment & Plan  Kathy Lewis is a 28 y.o. female admitted for the following problems: Principal Problem:   Nausea   # NICM s/p OHT 06/16/24 Patient has history of presumed peripartum NICM dating back to 2021. Ms. Altidor was just admitted from 10/19-11/4/25 and underwent OHT on 10/20, as well as kidney  transplant. Post-op course was complicated by thrombocytopenia which resolved, GI symptoms and bilateral IJ thrombi (AC was deferred due to frequent endomyocardial bx). She was started on pepcid , a PPI, zofran  and phenergan  for nausea/vomiting and switched to MWF Myfortic due to ongoing nausea/vomiting. She was discharged on 11/14. 07/15/24 outpatient EMBx with no evidence of significant acute cellular allograft rejection - Continue Tacrolimus 4 mg BID; FK level 11.9 on 11/17 (goal 8-12) - Holding Myfortic given nausea/GI issues - held since ER visit on Sunday 11/16 -Consider switching Myfortic to Imuran (azathioprine) given ongoing issues with nausea, but will need to discuss with kidney transplant team - Continue prednisone 15 mg daily - Continue Protonix  20 mg daily - Continue ASA, rosuvastatin , calcium -vit D  - PCP/Toxo (low risk toxo status): Bactrim x1 year (end 06/16/2025) - CMV: intermediate risk but received ATG induction. CMV DNA <260 on PCR from 11/17. Continue Valcyte (end 12/15/2024) - Candidiasis: Stopped Nystatin during last admission given persistent nausea - Aspergillus: s/p nebulized Amphotericin weekly during last adm up to time of discharge  # Intractable Nausea/vomiting Patient noted to have significant nausea during recent admission after transplant but was noted to improve with dose reduction and transition to Myfortic per chart review. She did have some ongoing intermittent nausea but was tolerating PO intake and meds at discharge. GI saw her during that admission and recommended stopping Buspar given possible contribution to N/V. Additionally, they recommended starting PPI. Patient has continued on medications recommended at discharge but reports no benefit from Zofran  or Compazine . She reports that dilaudid  is the only thing that has alleviated her nausea. Given her hx of T1DM, there was thought that there could be a component of gastroparesis causing her nausea but has  previously had a gastric emptying study back in June that was normal. Additionally, CT A/P during last admission without any underlying structural abnormality to explain ongoing nausea. While there could be a component of medication induced nausea, suspect that there is another underlying etiology given duration of symptoms (past two years). Additionally with her known hx of PNES it is possible that this could be functional nausea, but would be a diagnosis of exclusion. - Reassess anti-emetic regimen - PRN zofran --1st line - PRN phenergan --2nd line  - Consider trial of scopolamine  (did not initiate on admission given no nausea current nausea) - Continue PPI and famotidine  - Consider switching from Myfortic to Azathioprine as above given possible contribution to ongoing nausea  # Chest pain Patient presents with right sided chest pain. She has history of similar pain but has not felt this pain since prior to transplant until 11/18. No known provocating factors of the chest pain and only noted to be relieved with dilaudid  on admission. Patient was seen in heart transplant clinic on 11/19 and chest pain was thought to be noncardiac in nature. Of note, she had a recent echo and EMBx on 11/17. Echo at that time did not note a pericardial effusion.  However, on POCUS in heart transplant clinic patient noted to have minimal pericardial effusion so plan was to repeat imaging with a limited echo. EKG from 11/18 similar to prior. - Limited echo ordered - Tylenol  PRN - Lidocaine  patch - Dilaudid  2 mg PRN q8h (per dose/regimen provided at discharge)    # ESRD s/p DDKT 06/16/2024 # Ureteral stent in place # Abdominal JP drain in place  # Hypophosphatemia # Abnormal PTH Cr 1.16 at OSH on day of admission. No issues with voiding. JP drain in place.  - Immunosuppression/prophylaxis as above  - K Phos Neutral BID - Monitor JP drain output - Ureteral stent to be removed outpatient in ~6 weeks (~12/1)   #  Vitamin D  deficiency - Cholecalciferol 50000 units weekly x8 (end 12/25)   # Anxiety # Psychogenic nonepileptic seizures exacerbated by under-treated anxiety Patient noted to have ongoing anxiety during post-op period in last admission.Transplant psychiatry was previously following and assisting in her regimen. She has scheduled psychology follow up for 09/02/24 - Lexapro  10 mg daily - Olanzapine 2.5 mg nightly - Hydroxyzine  25 mg q6h PRN    # Type I DM A1c 6.9% 05/2024. Has insulin  pump but patient reports she has not been with her phone/receiver since 5 pm on 11/18 and has not been getting insulin  from her pump. Patient gets 1 unit/hr from her insulin  pump for basal insulin . [ ]  Consult endocrine in the AM -Glargine 8 units on admission given disconnected from insulin  pump for most of evening Per Endocrine recs last admission while off pump: -Glargine 24 units nightly while off pump -Lispro 0-16 units SQ TIDCC, give 1 unit:6 g CHO consumed  -SSI 2u: 50>150  # Bilateral IJ thrombus  - Defer systemic AC at this time given frequent biopsies. Consider outpatient once less frequent    Comorbid Conditions:     VTE Prophylaxis (Last Assessment on 11/6 at  3:37 PM)  - Anticoagulant Not Indicated due to Low Risk for VTE      Code Status: Prior  Patient Class & Status: Inpatient, Stepdown  Discharge Planning: pending workup    ARDETH KARNA HUSK, MD  [facility] 07/15/2024 8:15 PM    ------------------------------------------------------------------------------- Attestation signed by Consuela Lonni Ruth, MD at 07/16/2024  3:40 PM Attestation Statement:   I personally saw and evaluated the patient, and participated in the management and treatment plan as documented in the resident/fellow note.  I agree with the excellent assessment and plan detailed above.  Pt without nausea this AM. Says that Dilaudid  last night relieved her sx. Has not tried to eat  yet today. On exam, abd is soft, non-tender, no masses. We discussed her sx and medications. She has been off MMF/Myfortic for 72h now, so this seems an unlikely cause of her nausea. She has gotten little relief from phenergan  and ondansetron , but has been on quite low doses; pharmacy [facility]) rec increased doses of these meds to see if that helps. We can also try lorazepam , which has not been tried yet. This could also help to address a component of anxiety, as noted above. Pt has been eval for gastroparesis >1y ago and it was negative, but Reglan  is also a possibility (though with some concern for long QT).  For now, we've asked pt to try to eat, and if tolerating PO, she can go home with anti-emetic regimen @ doses rec by pharmacist. We will restart Myfortic at a low dose and can titrate back up in close  outpt f/u.  CHRISTOPHER LEE HOLLEY, MD  -------------------------------------------------------------------------------

## 2024-07-16 NOTE — Consults (Signed)
 Endocrinology Consult Note    Date of Consult: 07/16/2024  Reason for Consultation: Insulin  pump, recent OHT, hypoglycemia      History of Present Illness: Kathy Lewis is a 28 y.o. female with T1DM, peripartum NICM s/p OHT and ESRD s/p DDKT (06/16/24), anxiety with psychogenic nonepileptic seizures, admitted from OSH with right-sided chest pain and nausea/vomiting.  She discharged from index admission 07/01/24 on Omnipod 5 with Dexcom sensor. She has not utilized her pump since ~5P on 07/15/24 because she didn't have her receiver. She received glargine 8 units around 0250 last night and subsequently had a low to 37 at 0800 this morning.   Current prednisone dose is 15 mg daily and she has a PO diet.   Diagnosed  Age 8   Current home regimen Omnipod 5 in Automode with Humalog  insulin    Basal 0.5 units/hr ICR 1 unit/6g carbs  ISF - 50  Target 120  Dexcom G7 sensor    Outpatient provider/clinic [facility], [place]       Current inpatient diet:  Diet regular Oral Supplements - Adult All Supplements; Boost Glucose Control Max Protein-Assorted; With Meals     Patient Active Problem List  Diagnosis  . Type 1 diabetes mellitus (CMS/HHS-HCC)  . Hypercholesterolemia  . Pre-transplant evaluation for kidney transplant  . Kidney transplanted (HHS-HCC)  . Immunosuppressed status (HHS-HCC)  . S/P orthotopic heart transplant (CMS/HHS-HCC)  . Postoperative anemia  . Nausea  . History of internal jugular thrombosis  . Protein-calorie malnutrition, severe (HHS-HCC)      Past Medical History:  Diagnosis Date  . Anxiety   . Bipolar 2 disorder (CMS/HHS-HCC)   . CHF (congestive heart failure) (CMS/HHS-HCC) N/A  . Diabetes mellitus type 1 (CMS/HHS-HCC)   . ESRD (end stage renal disease) (CMS/HHS-HCC)   . Gastroparesis   . History of CVA (cerebrovascular accident) without residual deficits   . History of internal jugular  thrombosis    per chart review previously on Eliquis  for right IJ DVT  . History of pre-eclampsia   . History of tobacco use   . History of UTI   . HLD (hyperlipidemia)   . HSV infection   . HTN (hypertension)   . Hx of transfusion of whole blood   . NICM (nonischemic cardiomyopathy) (CMS/HHS-HCC)   . Personal history of pulmonary embolism      Past Surgical History:  Procedure Laterality Date  . permacath insertion  08/2022  . TRANSPLANT HEART N/A 06/15/2024   Procedure: ADULT, HEART TRANSPLANT, WITH OR WITHOUT RECIPIENT CARDIECTOMY;  Surgeon: Virgia Korene Raman, MD;  Location: [facility] OPERATING ROOMS;  Service: Cardiothoracic;  Laterality: N/A;  . TRANSPLANT KIDNEY N/A 06/16/2024   Procedure: ADULT, RENAL ALLOTRANSPLANTATION, IMPLANTATION OF GRAFT; WITHOUT RECIPIENT NEPHRECTOMY;  Surgeon: Charlanne Rounds, MD;  Location: [facility]PER[facility];  Service: General Surgery;  Laterality: N/A;  . CARDIAC CATHETERIZATION  N/A  . hx heart and kidney biopsies    . INSERTION PERITONEAL CANNULA/CATH FOR DIALYSIS    . OMENTECTOMY     PD catheter revision with partial omentectomy  . PERITONEAL CATHETER REMOVAL       Family History  Adopted: Yes  Problem Relation Age of Onset  . Ovarian cancer Mother   . Heart disease Father   . Chronic kidney disease Father   . No Known Problems Sister      Social History   Socioeconomic History  . Marital status: Single  . Number of children: 1  Tobacco Use  . Smoking status: Former    Current packs/day: 0.00    Average packs/day: 0.3 packs/day for 3.0 years (0.8 ttl pk-yrs)    Types: Cigarettes    Start date: 12/04/2015    Quit date: 12/04/2018    Years since quitting: 5.6  . Smokeless tobacco: Never  Vaping Use  . Vaping status: Never Used  Substance and Sexual Activity  . Alcohol use: Never  . Drug use: Not Currently    Types: Benzodiazepines    Comment: Rx'd Klonopin  prn  . Sexual activity: Not Currently    Comment: LMP- last 2022   Social History Narrative   Lives at home with her adoptive father and daughter   She has a 55 yo daughter      On disability      Caregivers would be father and mom   Social Drivers of Corporate Investment Banker Strain: Low Risk  (07/16/2024)   Overall Financial Resource Strain (CARDIA)   . Difficulty of Paying Living Expenses: Not hard at all  Food Insecurity: No Food Insecurity (07/16/2024)   Hunger Vital Sign   . Worried About Programme Researcher, Broadcasting/film/video in the Last Year: Never true   . Ran Out of Food in the Last Year: Never true  Transportation Needs: No Transportation Needs (07/16/2024)   PRAPARE - Transportation   . Lack of Transportation (Medical): No   . Lack of Transportation (Non-Medical): No  Physical Activity: Insufficiently Active (02/06/2024)   Received from [facility]   Exercise Vital Sign   . On average, how many days per week do you engage in moderate to strenuous exercise (like a brisk walk)?: 3 days   . On average, how many minutes do you engage in exercise at this level?: 30 min  Stress: No Stress Concern Present (02/06/2024)   Received from [facility] - Occupational Stress Questionnaire   . Feeling of Stress : Not at all  Social Connections: Socially Integrated (02/06/2024)   Received from [facility]   Social Network   . How would you rate your social network (family, work, friends)?: Good participation with social networks  Housing Stability: Low Risk  (07/16/2024)   Housing Stability Vital Sign   . Unable to Pay for Housing in the Last Year: No   . Number of Times Moved in the Last Year: 0   . Homeless in the Last Year: No     Allergies  Allergen Reactions  . Cantaloupe Itching and Other (See Comments)    Mouth itching  Mouth itching  Per patient she has no food or drug allergies 02/06/2024  . Nsaids (Non-Steroidal Anti-Inflammatory Drug) Other (See Comments)    Avoid post-transplant. OK for aspirin  81  mg daily.       Medications:   Medications Prior to Admission  Medication Sig Dispense Refill  . aspirin  81 MG EC tablet Take 1 tablet (81 mg total) by mouth once daily 30 tablet 11  . calcium  carbonate-vitamin D3 (CALTRATE 600+D) 600 mg-10 mcg (400 unit) tablet Take 1 tablet by mouth 2 (two) times daily with meals 60 tablet 11  . [START ON 07/17/2024] cholecalciferol (VITAMIN D3) 1,250 mcg (50,000 unit) capsule Take 1 capsule (50,000 Units total) by mouth once a week --last dose:  08/07/24 4 capsule 0  . escitalopram  oxalate (LEXAPRO ) 10 MG tablet Take 1 tablet (10 mg total) by mouth once daily 30 tablet 2  . famotidine  (PEPCID )  10 MG tablet Take 1 tablet (10 mg total) by mouth 2 (two) times daily 60 tablet 2  . mycophenolate (MYFORTIC) 180 MG DR tablet Take 2 tablets (360 mg total) by mouth every 12 (twelve) hours 120 tablet 11  . OLANZapine (ZYPREXA) 2.5 MG tablet Take 1 tablet (2.5 mg total) by mouth at bedtime 30 tablet 3  . pantoprazole  (PROTONIX ) 20 MG DR tablet Take 1 tablet (20 mg total) by mouth once daily 30 tablet 11  . predniSONE (DELTASONE) 5 MG tablet Take 3 tablets (15 mg total) by mouth once daily 90 tablet 11  . rosuvastatin  (CRESTOR ) 10 MG tablet Take 1 tablet (10 mg total) by mouth once daily 30 tablet 11  . sodium phosphate -potassium phosphate (K-PHOS NEUTRAL) 250 mg tablet Take 2 tablets by mouth 2 (two) times daily at lunch and dinner 120 tablet 1  . sulfamethoxazole-trimethoprim (BACTRIM SS) 400-80 mg tablet Take 1 tablet (80 mg of trimethoprim total) by mouth once daily 30 tablet 11  . tacrolimus (PROGRAF) 1 MG capsule Take 4 capsules (4 mg total) by mouth every 12 (twelve) hours 240 capsule 11  . valGANciclovir (VALCYTE) 450 mg tablet Take 2 tablets (900 mg total) by mouth once daily 176 tablet 0  . acetaminophen  (TYLENOL ) 325 MG tablet Take 2 tablets (650 mg total) by mouth every 6 (six) hours as needed for Pain (Do not exceed 3000mg  in 24 hours) 240 tablet 1  .  blood glucose diagnostic test strip Use 1 test strip 3 (three) times daily 100 each 3  . blood glucose meter kit Use as directed 1 each 0  . blood-glucose sensor (DEXCOM G7 SENSOR) Devi APPLY 1 SENSOR TO SKIN EVERY 10 DAYS    . DEXCOM G6 RECEIVER Misc     . HUMALOG  KWIKPEN 100 unit/mL pen injector INJECT 50 UNITS SUBCUTANEOUSLY DAILY (Patient not taking: Reported on 07/15/2024) 45 mL 11  . HYDROmorphone  (DILAUDID ) 2 MG tablet Take 1 tablet (2 mg total) by mouth every 8 (eight) hours as needed for moderate-to-severe pain for up to 7 days 20 tablet 0  . hydrOXYzine  pamoate (VISTARIL ) 25 MG capsule Take 1 capsule (25 mg total) by mouth every 6 (six) hours as needed for Anxiety 120 capsule 1  . insulin  GLARGINE (LANTUS  SOLOSTAR) pen injector (concentration 100 units/mL) This is for OFF PUMP PLAN ONLY- in event of insulin  pump malfunction or failure, inject 16 units glargine subcutaneously once daily. 15 mL 3  . insulin  LISPRO (HUMALOG  KWIKPEN INSULIN ) pen injector (concentration 100 units/mL) This is for OFF PUMP PLAN ONLY- in event of pump failure or malfunction, inject 1 unit for every 6 grams of carbohydrates consumed immediately after eating three times daily with meals. Maximum total daily dose 50 units. (Patient not taking: Reported on 07/15/2024) 15 mL 3  . lancets Use 1 each 3 (three) times daily (Patient not taking: Reported on 07/15/2024) 100 each 3  . OMNIPOD 5 G6-G7 PODS, GEN 5, Crtg USE AS DIRECTED EVERY 72 HOURS    . ondansetron  (ZOFRAN -ODT) 4 MG disintegrating tablet Dissolve 1 tablet (4 mg total) by mouth every 8 (eight) hours as needed for Nausea or Vomiting (--first line) 60 tablet 1  . pen needle, diabetic 31 gauge x 3/16 needle Use as directed (Patient not taking: Reported on 07/15/2024) 100 each 3  . promethazine  (PHENERGAN ) 12.5 MG tablet Take 1 tablet (12.5 mg total) by mouth every 8 (eight) hours as needed for Nausea or Vomiting (--second line) 60 tablet 1  .  sennosides-docusate  (SENOKOT-S) 8.6-50 mg tablet Take 1-2 tablets by mouth 2 (two) times daily as needed for Constipation 60 tablet 2     Current Facility-Administered Medications  Medication Dose Route Frequency Provider Last Rate Last Admin  . acetaminophen  (TYLENOL ) tablet 650 mg  650 mg Oral Q6H PRN Loflin, Ardeth Aquas, MD      . aspirin  EC tablet 81 mg  81 mg Oral Daily Loflin, Ardeth Aquas, MD   81 mg at 07/16/24 9072  . calcium  carbonate-vitamin D3 (CALTRATE 600+D) 600 mg-10 mcg (400 unit) tablet 1 tablet  1 tablet Oral BID CC Loflin, Callan Denise, MD   1 tablet at 07/16/24 0926  . [START ON 07/22/2024] cholecalciferol (VITAMIN D3) capsule 50,000 Units  50,000 Units Oral Weekly Loflin, Ardeth Aquas, MD      . dextrose  50% in water  solution 12.5-25 g  12.5-25 g Intravenous As Directed Bostian, Manuelita Pata, PA      . escitalopram  oxalate (LEXAPRO ) tablet 10 mg  10 mg Oral Daily Loflin, Ardeth Aquas, MD   10 mg at 07/16/24 9073  . famotidine  (PEPCID ) tablet 10 mg  10 mg Oral BID Loflin, Callan Denise, MD   10 mg at 07/16/24 9072  . glucagon  (GLUCAGEN ) injection 1 mg  1 mg Subcutaneous As Directed Anastasia Manuelita Pata, PA      . heparin  (porcine) 5,000 unit/mL injection 5,000 Units  5,000 Units Subcutaneous Q8H SCH Loflin, Ardeth Aquas, MD   5,000 Units at 07/16/24 9073  . HYDROmorphone  (DILAUDID ) tablet 2 mg  2 mg Oral Q8H PRN Loflin, Ardeth Aquas, MD   2 mg at 07/16/24 0253  . hydrOXYzine  pamoate (VISTARIL ) capsule 25 mg  25 mg Oral Q6H PRN Loflin, Ardeth Aquas, MD      . insulin  GLARGINE-yfgn (SEMGLEE ) injection 24 Units  24 Units Subcutaneous QHS Loflin, Ardeth Aquas, MD      . insulin  LISPRO (AdmeLOG , HumaLOG ) injection correction dose 0-10 Units  0-10 Units Subcutaneous TID CC Loflin, Ardeth Aquas, MD      . insulin  LISPRO (AdmeLOG , HumaLOG ) injection correction dose 0-16 Units  0-16 Units Subcutaneous TID CC Loflin, Ardeth Aquas, MD      . lidocaine  (SALONPAS) 4 % patch 1 patch  1 patch  Transdermal Q24H Loflin, Ardeth Aquas, MD   1 patch at 07/16/24 0249  . lidocaine  (XYLOCAINE ) 1 % injection 0.5 mL  0.5 mL Subcutaneous As Directed Loflin, Ardeth Aquas, MD      . OLANZapine (ZyPREXA) tablet 2.5 mg  2.5 mg Oral QHS Loflin, Ardeth Aquas, MD      . ondansetron  (ZOFRAN -ODT) disintegrating tablet 4 mg  4 mg Oral Q8H PRN Loflin, Ardeth Aquas, MD      . pantoprazole  (PROTONIX ) EC tablet 20 mg  20 mg Oral Daily Loflin, Ardeth Aquas, MD   20 mg at 07/16/24 0927  . predniSONE (DELTASONE) tablet 15 mg  15 mg Oral Daily Loflin, Ardeth Aquas, MD   15 mg at 07/16/24 9072  . promethazine  (PHENERGAN ) tablet 12.5 mg  12.5 mg Oral Q8H PRN Loflin, Ardeth Aquas, MD      . rosuvastatin  (CRESTOR ) tablet 10 mg  10 mg Oral Daily Loflin, Ardeth Aquas, MD   10 mg at 07/16/24 0927  . sennosides-docusate (SENOKOT-S) 8.6-50 mg tablet 1-2 tablet  1-2 tablet Oral BID PRN Loflin, Ardeth Aquas, MD      . sodium phosphate -potassium phosphate (K-PHOS NEUTRAL) tablet 2 tablet  2 tablet Oral BID LS Loflin, Ardeth Aquas, MD      .  sulfamethoxazole-trimethoprim (BACTRIM SS) 400-80 mg tablet 80 mg of trimethoprim  1 tablet Oral Daily Loflin, Ardeth Aquas, MD   80 mg of trimethoprim at 07/16/24 0927  . tacrolimus (PROGRAF) capsule 4 mg  4 mg Oral Q12H SCH Loflin, Ardeth Aquas, MD   4 mg at 07/16/24 9073  . valGANciclovir (VALCYTE) tablet 900 mg  900 mg Oral Daily with breakfast Loflin, Ardeth Aquas, MD   900 mg at 07/16/24 0926      Temp:  [36.7 C (98 F)-36.9 C (98.4 F)] 36.8 C (98.2 F) Heart Rate:  [93-100] 94 Resp:  [18-20] 18 BP: (109-138)/(67-103) 109/67  Temp (24hrs), Avg:36.8 C (98.2 F), Min:36.7 C (98 F), Max:36.9 C (98.4 F)  Weight: 57.2 kg (126 lb)      Recent Labs  Lab 07/11/24 0619 07/14/24 0759 07/16/24 0638  WBC 3.8 4.2 5.1  HGB 10.1* 11.6* 10.8*  HCT 33.2* 38.3 35.4  PLT 278 265 305  NA 138 142 143  K 4.6 4.6 4.5  CL 108 110* 111*  CO2 22 26 22   BUN 14 12 12    CREATININE 1.2* 1.3* 1.1*     Lab Results  Component Value Date   GFR 70 07/16/2024   GFR 57 07/14/2024   GFR 63 07/11/2024    Lab Results  Component Value Date   HGBA1C 6.9 (H) 06/15/2024   HGBA1C 7.0 (H) 03/31/2024   HGBA1C 7.0 (!) 03/31/2024    Lab Results  Component Value Date   TSH 1.33 03/31/2024   T4FREE 1.04 07/03/2024    Recent Labs    07/14/24 1433 07/14/24 1556 07/16/24 0152 07/16/24 0819 07/16/24 0836 07/16/24 0914  POCGLU 79 104 130 37* 56* 103        Assessment & Plan   Kathy Lewis is a 28 y.o. female with T1DM, peripartum NICM s/p OHT and ESRD s/p DDKT (06/16/24), anxiety with psychogenic nonepileptic seizures, admitted from OSH with right-sided chest pain and nausea/vomiting. Not concerned for DKA. Endocrinology following for assistance with glycemic control.  She received glargine 8 units last and then had a low. Is taking PO now and replacing her pump. Plan by team is for discharge home. Not seen today.    Inpatient Recommendations  Previous settings in pump: Omnipod 5 in Automode with Humalog  insulin    Basal 0.5 units/hr ICR 1 unit/6g carbs  ISF - 50  Target 120  Dexcom G7 sensor  Goal BG <180 without hypoglycemia.     Discharge planning   Patient is s/p OHT/DDKT. Please do not start SGLT2i or GLP1-RA for at least 6 months following transplant per cardiology protocol.    Patient may resume pump and CGM.  Follow up with her local Endocrinologist  Insurance/ability to afford medications: [employer] MEDICARE ADVANTAGE PLAN/MEDICAID [place]      ====================================  KRISTEN MCMILLAN, NP

## 2024-07-16 NOTE — Progress Notes (Signed)
  Department of Pharmacy  Pharmacy Note   Saw patient alongside attending and first call provider today. Discussed prior trials of anti-emetics given patient's extensive history of nausea.  Patient reported to myself and team that ondansetron , promethazine , and prochlorperazine  historically have not worked for managing her nausea. Patient has also previously tried metoclopramide  that she did not find relief with. In looking at previous prescriptions and fill history, it does not appear that these doses were maximized, therefore I would recommend continuing to try these agents at their maximum doses (as long as clinically appropriate).   The patient will discharge on ondansetron  8 mg PO q8h PRN, promethazine  25 mg PO q4-6h PRN, and lorazepam  1 mg PO q6h PRN. She also continues on olanzapine 2.5 mg PO nightly as part of her mental health regimen. Her GERD regimen was also increased and can be increased further if symptoms are consistent. If the above is ineffective, would consider re-trial of prochloperazine 5-10 mg q6h PRN (max 40 mg/day) or re-trial of metoclopramide  (also consider discontinuing ineffective PRN agents that could worsen adverse effects such as extrapyramidal symptoms or Qtc prolongation), or scopolamine  patch. Could also consider discontinuing olanzapine and initiating amitriptyline  (if psychiatry in agreement), which has been shown to suggest benefit in cyclic vomiting syndrome.   Ileana Limes, PharmD, BCTXP Clinical Pharmacist, Heart Transplant Pager: [phone]

## 2024-07-16 NOTE — Consults (Signed)
 Nephrology Consult Note: Transplant  History of Present Illness   Kathy Lewis is a 28 y.o. female with PMH of ESRD 2/2 T1DM s/p DDKT (06/16/24), peripartum NICM with severe LV dysfunction s/p OHT (06/16/24) who presents as a transfer from OSH with ongoing N/V and chest pain. Her post operative course was complicated by thrombocytopenia, bilateral IJ thrombi, anxiety, and nausea/vomiting. Her last echo and endomyocardial biopsy was on 11/7 which were unremarkable, no evidence of rejection. She was recently discharged ~ 1 week ago from index admission for [facility] transplant - she was seen in clinic yesterday with complaints of intractable nausea and chest pain and later presented to [facility] for ongoing symptoms. Her chest pain resolved with morphine  administration. POCUS and EKG were without significant changes. Regarding her nausea - she had CT A/P during previous admission that did not show an underlying structural problem. C/f gastroparesis - last emptying study was in June that was normal. Anti-emetics have not provided her any relief. Her MMF was stopped this admission due to symptoms.   On my evaluation, patient reports that her symptoms have primarily resolved with pain medication. She does note that this nausea/vomiting has been intermittent for the last two years and symptoms now are similar to past. She has had extensive work up including EGD/colonoscopy, gastric emptying study, multiple imaging studies and GI evaluation that have not revealed source.   Kidney transplant details: DDKT on 06/16/24 (DCD, PRA 76/85, KDPI 29%, Thymo induction, Rkidney to RLQ, 1a/1v/1u, CIT 19h 79m, WIT 32m, pump 15h, CMV +/+, EBV -/+, + ureteral stent, not IR). Kidney made urine at reperfusion. No intraoperative complications.   ROS   The review of systems was negative unless otherwise stated in the HPI.   Past Medical History    Patient Active Problem List   Diagnosis Date Noted  .  Protein-calorie malnutrition, severe (HHS-HCC) 07/04/2024  . History of internal jugular thrombosis   . Nausea 06/25/2024  . S/P orthotopic heart transplant (CMS/HHS-HCC) 06/17/2024  . Postoperative anemia 06/17/2024  . Kidney transplanted (HHS-HCC) 06/16/2024  . Immunosuppressed status (HHS-HCC) 06/16/2024  . Pre-transplant evaluation for kidney transplant 04/04/2024  . Type 1 diabetes mellitus (CMS/HHS-HCC) 01/16/2012  . Hypercholesterolemia 01/16/2012    Social History   Socioeconomic History  . Marital status: Single  . Number of children: 1  Tobacco Use  . Smoking status: Former    Current packs/day: 0.00    Average packs/day: 0.3 packs/day for 3.0 years (0.8 ttl pk-yrs)    Types: Cigarettes    Start date: 12/04/2015    Quit date: 12/04/2018    Years since quitting: 5.6  . Smokeless tobacco: Never  Vaping Use  . Vaping status: Never Used  Substance and Sexual Activity  . Alcohol use: Never  . Drug use: Not Currently    Types: Benzodiazepines    Comment: Rx'd Klonopin  prn  . Sexual activity: Not Currently    Comment: LMP- last 2022  Social History Narrative   Lives at home with her adoptive father and daughter   She has a 77 yo daughter      On disability      Caregivers would be father and mom   Social Drivers of Corporate Investment Banker Strain: Low Risk  (07/16/2024)   Overall Financial Resource Strain (CARDIA)   . Difficulty of Paying Living Expenses: Not hard at all  Food Insecurity: No Food Insecurity (07/16/2024)   Hunger Vital Sign   . Worried About  Running Out of Food in the Last Year: Never true   . Ran Out of Food in the Last Year: Never true  Transportation Needs: No Transportation Needs (07/16/2024)   PRAPARE - Transportation   . Lack of Transportation (Medical): No   . Lack of Transportation (Non-Medical): No  Physical Activity: Insufficiently Active (02/06/2024)   Received from [facility]   Exercise Vital Sign   . On average, how many days  per week do you engage in moderate to strenuous exercise (like a brisk walk)?: 3 days   . On average, how many minutes do you engage in exercise at this level?: 30 min  Stress: No Stress Concern Present (02/06/2024)   Received from [facility] - Occupational Stress Questionnaire   . Feeling of Stress : Not at all  Social Connections: Socially Integrated (02/06/2024)   Received from [facility]   Social Network   . How would you rate your social network (family, work, friends)?: Good participation with social networks  Housing Stability: Low Risk  (07/16/2024)   Housing Stability Vital Sign   . Unable to Pay for Housing in the Last Year: No   . Number of Times Moved in the Last Year: 0   . Homeless in the Last Year: No    Family History  Adopted: Yes  Problem Relation Name Age of Onset  . Ovarian cancer Mother    . Heart disease Father    . Chronic kidney disease Father    . No Known Problems Sister      Allergies   Allergies  Allergen Reactions  . Cantaloupe Itching and Other (See Comments)    Mouth itching  Mouth itching  Per patient she has no food or drug allergies 02/06/2024  . Nsaids (Non-Steroidal Anti-Inflammatory Drug) Other (See Comments)    Avoid post-transplant. OK for aspirin  81 mg daily.     Home Medications   Current Outpatient Medications  Medication Instructions  . acetaminophen  (TYLENOL ) 325 MG tablet Take 2 tablets (650 mg total) by mouth every 6 (six) hours as needed for Pain (Do not exceed 3000mg  in 24 hours)  . ACID REDUCER (FAMOTIDINE ) 10 mg, Oral, 2 times Daily  . aspirin  81 mg, Oral, Daily  . blood glucose diagnostic test strip Use 1 test strip 3 (three) times daily  . blood glucose meter kit Use as directed  . blood-glucose sensor (DEXCOM G7 SENSOR) Devi APPLY 1 SENSOR TO SKIN EVERY 10 DAYS  . calcium  carbonate-vitamin D3 (CALTRATE 600+D) 600 mg-10 mcg (400 unit) tablet 1 tablet, Oral, 2 times Daily  with meals  . [START ON 07/17/2024] cholecalciferol (VITAMIN D3) 50,000 Units, Oral, Weekly, --last dose:  08/07/24  . DEXCOM G6 RECEIVER Misc   . escitalopram  oxalate (LEXAPRO ) 10 MG tablet Take 1 tablet (10 mg total) by mouth once daily  . HUMALOG  KWIKPEN 100 unit/mL pen injector INJECT 50 UNITS SUBCUTANEOUSLY DAILY  . HYDROmorphone  (DILAUDID ) 2 MG tablet Take 1 tablet (2 mg total) by mouth every 8 (eight) hours as needed for moderate-to-severe pain for up to 7 days  . hydrOXYzine  pamoate (VISTARIL ) 25 mg, Oral, Every 6 hours PRN  . insulin  GLARGINE (LANTUS  SOLOSTAR) pen injector (concentration 100 units/mL) This is for OFF PUMP PLAN ONLY- in event of insulin  pump malfunction or failure, inject 16 units glargine subcutaneously once daily.  . insulin  LISPRO (HUMALOG  KWIKPEN INSULIN ) pen injector (concentration 100 units/mL) This is for OFF PUMP PLAN ONLY- in  event of pump failure or malfunction, inject 1 unit for every 6 grams of carbohydrates consumed immediately after eating three times daily with meals. Maximum total daily dose 50 units.  SABRA lancets 1 each, Misc.(Non-Drug; Combo Route), 3 times Daily  . mycophenolate (MYFORTIC) 360 mg, Oral, Every 12 hours  . OLANZapine (ZYPREXA) 2.5 mg, Oral, Nightly  . OMNIPOD 5 G6-G7 PODS, GEN 5, Crtg USE AS DIRECTED EVERY 72 HOURS  . ondansetron  (ZOFRAN -ODT) 4 MG disintegrating tablet Dissolve 1 tablet (4 mg total) by mouth every 8 (eight) hours as needed for Nausea or Vomiting (--first line)  . pantoprazole  (PROTONIX ) 20 mg, Oral, Daily  . pen needle, diabetic 31 gauge x 3/16 needle Misc.(Non-Drug; Combo Route), As Directed  . predniSONE (DELTASONE) 15 mg, Oral, Daily  . promethazine  (PHENERGAN ) 12.5 mg, Oral, Every 8 hours PRN  . rosuvastatin  (CRESTOR ) 10 mg, Oral, Daily  . sennosides-docusate (SENOKOT-S) 8.6-50 mg tablet 1-2 tablets, Oral, 2 times Daily PRN  . sodium phosphate -potassium phosphate (K-PHOS NEUTRAL) 250 mg tablet 2 tablets, Oral, 2 times  Daily at lunch and dinner  . sulfamethoxazole-trimethoprim (BACTRIM SS) 400-80 mg tablet 80 mg of trimethoprim, Oral, Daily  . tacrolimus (PROGRAF) 4 mg, Oral, Every 12 hours  . valGANciclovir (VALCYTE) 900 mg, Oral, Daily     Active Medications   . aspirin   81 mg Oral Daily  . calcium  carbonate-vitamin D3  1 tablet Oral BID CC  . [START ON 07/22/2024] cholecalciferol  50,000 Units Oral Weekly  . escitalopram  oxalate  10 mg Oral Daily  . famotidine   10 mg Oral BID  . heparin  (porcine)  5,000 Units Subcutaneous Q8H SCH  . [Held by provider] insulin  GLARGINE-yfgn (SEMGLEE ) injection  24 Units Subcutaneous QHS  . insulin  LISPRO (AdmeLOG , HumaLOG ) injection  0-16 Units Subcutaneous TID CC  . insulin  LISPRO (AdmeLOG , HumaLOG ) injection  0-4 Units Subcutaneous TID CC  . lidocaine   1 patch Transdermal Q24H  . OLANZapine  2.5 mg Oral QHS  . pantoprazole   20 mg Oral Daily  . predniSONE  15 mg Oral Daily  . rosuvastatin   10 mg Oral Daily  . sodium phosphate -potassium phosphate  2 tablet Oral BID LS  . sulfamethoxazole-trimethoprim  1 tablet Oral Daily  . tacrolimus  4 mg Oral Q12H SCH  . valGANciclovir  900 mg Oral Daily with breakfast    Objective    Current Vital Signs 24h Vital Sign Ranges  T 36.8 C (98.3 F) Temp  Avg: 36.8 C (98.2 F)  Min: 36.7 C (98 F)  Max: 36.8 C (98.3 F)  BP 104/77 BP  Min: 104/77  Max: 118/92  HR 89 Pulse  Avg: 92.3  Min: 89  Max: 94  RR 16 Resp  Avg: 17.3  Min: 16  Max: 18  SaO2 100 %   SpO2  Avg: 98 %  Min: 96 %  Max: 100 %          Ins & Outs I/O last 2 completed shifts: In: -  Out: 60 [Drains:60] Current Shift:  11/19 0701 - 11/19 1900 In: 500 [P.O.:500] Out: 530 [Urine:500; Drains:30]   Physical Exam Gen:   NAD CV:   RRR, no extra heart sounds Pulm:   normal WOB, CTAB Abd:   soft, NTND. JP drain with serosanguinous output  MSK:   no pedal edema   Labs   Recent Labs  Lab 07/11/24 0619 07/14/24 0759 07/16/24 0638  WBC 3.8 4.2 5.1   HGB 10.1* 11.6* 10.8*  PLT  278 265 305   Recent Labs  Lab 07/11/24 0619 07/14/24 0759 07/16/24 0638  NA 138 142 143  K 4.6 4.6 4.5  CL 108 110* 111*  CO2 22 26 22   BUN 14 12 12   CREATININE 1.2* 1.3* 1.1*  CALCIUM  8.7 8.4* 8.7  ALKPHOS  --  111*  --   GLUCOSE 75 113 41*   Lab Results  Component Value Date   PTH 199 (H) 07/02/2024   CALCIUM  8.7 07/16/2024   CAION 1.14 (L) 07/02/2024   PHOS 1.8 (L) 07/16/2024   Lab Results  Component Value Date   IRON  42 10/23/2022   TIBC 229 (L) 10/23/2022   FERRITIN 587 (H) 10/23/2022    FK506 (Tacrolimus)  Date Value Ref Range Status  07/14/2024 11.9 5.0 - 20.0 ng/mL Final  07/11/2024 10.6 5.0 - 20.0 ng/mL Final  07/10/2024 5.7 5.0 - 20.0 ng/mL Final    No results for input(s): COLORU, CLARITYU, SPECGRAV, LABPH, PROTEINUA, GLUCOSEU, KETONESU, BLOODU, NITRITE, LEUKOCYTESUR, BILIRUBINUR, UROBILINOGEN, RBCUA, WBCUA, SQUAMEPI, UOTHEPI, HYALINE, CASTUA, BACTERIA, UCLINITST in the last 168 hours.  Assessment and Recommendations   Ms. Quarry is a  28 y.o. woman with ESRD (previous on PD - removed 12/2022, was on iHD via permcath, nearly anuric), Type 1 DM, and presumed 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 s/p OTHT 06/15/24 and DDKT 06/16/24.    Allograft function  Baseline creatinine: 1.1-1.2. DDKT on 06/16/24 (DCD, PRA 76/85, KDPI 29%, Thymo induction, Rkidney to RLQ, 1a/1v/1u, CIT 19h 42m, WIT 74m, pump 15h, CMV +/+, EBV -/+, + ureteral stent, not IR). Kidney made urine at reperfusion. Allograft function excellent JP drain continues to have high output - discussed with surgery - will maintain drain at this time Fluid creatinine 1.4 on 06/23/24 - consistent with serum creatinine Clinic follow up with surgery scheduled 11/25  Immunosuppression FK506 (Tacrolimus)  Date Value Ref Range Status  07/14/2024 11.9 5.0 - 20.0 ng/mL Final  07/11/2024 10.6 5.0 - 20.0 ng/mL Final   07/10/2024 5.7 5.0 - 20.0 ng/mL Final   Thymoglobulin induction 6mg /kg completed CNI: Tacrolimus 4 mg BID  FK Goal: 8-10 MPA: myfortic held due to GI complaints (was previously discharged on 360 mg BID) - given she has had these symptoms for 2 years it appears unlikely to be related to medication - would recommend restarting this  Prednisone taper per protocol - currently on 15 mg daily    Prophylaxis PJP prophylaxis: Bactrim through 23m anniversary. CMV: Valganciclovir; indicated due to high risk or intermediate risk with induction; dose adjustment due to current GFR and WBC BK:  not detected 11/17 Fungal: ampho inhaled Famotidine    BP/Volume Home meds: no meds   Electrolyte disturbances NAGMA Phos remains low - continue on oral K-phos supplementation    LAUREN A KRING, DO Transplant Nephrology Fellow      ------------------------------------------------------------------------------- Attestation signed by [place] Donnice Heinz, MD at 07/16/2024  2:57 PM  Attestation Statement:   I personally saw and evaluated the patient, and participated in the management and treatment plan as documented in the resident/fellow note.  Donnice Heinz [place], MD  -------------------------------------------------------------------------------

## 2024-07-16 NOTE — Progress Notes (Signed)
 Case Manager Discharge Summary / Closing Note  Expected Discharge Date & Time: 07/16/2024 at   Discharge Plan:  Patient is discharging home with routine care and no post-acute services were indicated prior to discharge.    Post-Acute Services Coordinated: No resources indicated at this time  Funding for Discharge Medications: Drug assistance not needed    Transportation: Discharge Transportation: private vehicle  Final ADT:  Final ADT Discharge Disposition: Home Based  Home Based: Home or Self Care                 Second IMM Received: Not indicated (07/16/24 1301)  Final Summary: CM met with patient at bedside this afternoon to assess for needs.  Patient reports she is feeling better, has long history of nausea, worsened by post transplant meds and is improved with increase of antinausea meds.  Patient denied CM needs, and per discussion with patient and provider, will discharge today.  Patient aware of CM availability should needs arise.     [place], MSW, LCSW, ACM-SW Transplant Case Manager/Clinical Social Worker Cell:  [phone]

## 2024-07-16 NOTE — Discharge Summary (Signed)
 [facility]                 Transplant Cardiology Discharge Summary   Admit Date: 07/15/2024  Discharge Date: 07/16/2024  Admitting Physician: Lonni Jama Orris, MD  Discharge Physician: No att. providers found  Primary Care Provider: Lesa Jon Jansky, PA  Discharge Destination: Home  Discharge Services: none  Code Status: Full Code   Admission Diagnoses:  chest pain, n/v, kidney and heart txp discharged 11/14 from [facility]  Discharge Diagnoses:  Principal Problem:   Nausea Active Problems:   Type 1 diabetes mellitus (CMS/HHS-HCC)   Kidney transplanted (HHS-HCC)   Immunosuppressed status (HHS-HCC)   S/P orthotopic heart transplant (CMS/HHS-HCC)   Vomiting   Atypical chest pain   Hypophosphatemia   GAD (generalized anxiety disorder) Resolved Problems:   * No resolved hospital problems. *     Presented to OSH with acute on chronic nausea/vomiting and chest pain which improved with dilaudid . Patient requested to discharge < 24hours of being admitted and deferred further evaluation at this time. Reported mild nausea with lunch that improved with PO ativan  1mg . Increased phenergan , zofran , and protonix . Discharged with ativan  1mg  x5 days for symptoms. Unclear if chest pain is related to anxiety vs GI. Would recommend GI consult for EGD and CT chest if symptoms are recurrent/persistent. Plan to resume myfortic 180mg  q12hr next week at abdominal TXP follow-up 11/25 given no improvement of symptoms with holding myfortic since Sunday.    - FK level pending at discharge  OHT/DDKT: 06/16/24, [facility] results: T and B cell negative Explant path: Myocyte hypertrophy, interstitial and perivascular fibrosis, mild CAD Current regimen: FK, Prednisone. Holding myfortic  FK goal: 8-12 CMV: D+/R+  Toxo: D-/R- Infectious PPx: Bactrim, ampho CMV: GCV; Valcyte x6 mo given thymo induction (end 12/15/24) General PPx: ASA, ca/vit D,  mag ox, and statin Echo: 11/19 normal biventricular function  EMB: 11/17 0R pAMR 0    Cardiac Rehab: ordered last admission   Patient Discharge Instructions:   No driving or operating heavy equipment at all if you are taking narcotic pain medication   May shower, but no soaking tub baths  Order Comments: For 6 weeks following surgery   Bring your medicines to clinic   Notify provider of severe or new chest/anginal pain.   Notify provider of swelling in arms, legs, or stomach   Notify provider of severe nausea or vomiting or inability to tolerate food and medications.   Notify Provider of bleeding, expanding redness, expanded swelling, new pain, or drainage from surgical incision(s).   Notify surgeon of any new pain, swelling, tenderness to or around incision(s), line(s), or healed drain sites up to one year after surgery.   Notify provider temperature greater than 101.0 F (38.3 C) degrees   Notify provider of new onset of dizziness or syncope (passing out).   Notify provider of weight gain greater than 2 lbs in 1 day or 5 lbs in 1 week   Notify provider of new onset difficulty breathing or shortness of breath   Surgeons office contact information:  Order Comments: Mon - Fri normal working hours: Call Dr. Chyrl office at [phone] After hours and weekends: [phone] and ask for the Thoracic Surgery Resident on-call   Protecting your sternum and allowing the bone to heal is an important part of your recovery after surgery  Order  Comments: You can only lift objects if you keep your arms close enough to touch your body and should avoid pushing and pulling with the arms for 6 weeks following surgery.  If you feel pain or discomfort with any movement you should refrain from this activity.  Avoid stretching both arms backwards for first 10 days after discharge.   Regular   Notify provider a new rapid or irregular heart rate at rest   If you smoke (or have smoked  within the last year), we strongly recommend that you do not smoke.   Please call the transplant office at [phone] with any questions or issues. For urgent issues after business hours, call the hospital operator at [phone] and ask to speak with the on-call Transplant Cardiologist.   Monitor blood pressure  Order Comments: Check your blood pressure daily and maintain a log of your results.  If you have more than 2 consecutive readings with systolic blood pressures (top number) greater than 140 or less than 95 notify your physician.   Monitor blood glucose     [facility] Provider Follow-up: Future Appointments  Date Time Provider Department Center  07/22/2024 10:00 AM Kip Rosaline Reusing, [place] 2B2C ABD TXP [facility]  07/29/2024  1:00 AM [facility]ATH [facility] 1 [facility]  07/29/2024  7:20 AM LABS-2F2G DC2F2G [facility]  07/29/2024  8:15 AM HEART TRANSPLANT PROVIDER CARDTRANS [facility]  07/29/2024  1:00 PM [facility] CDU ECHO CATH HOLDING DUHCDUTEE [facility]  07/31/2024  9:30 AM Vernel Greig Jansky, PA DSUROLOGY [facility]  07/31/2024 10:00 AM KIDNEY TRANSPLANT SOCIAL WORK PROVIDER [facility]  07/31/2024 11:40 AM RENAL TRANSPLANT PROVIDER 1 KIDPANTP [facility]  08/11/2024  1:00 AM [facility] CATH LAB 1 [facility]  08/11/2024  7:40 AM LABS-2F2G DC2F2G [facility]  08/11/2024  8:15 AM HEART TRANSPLANT PROVIDER CARDTRANS [facility]  08/11/2024  1:00 PM [facility] CDU ECHO CATH HOLDING [facility]  09/02/2024 11:00 AM [facility] Clotilda Greig, PhD [facility]    Non-[facility] Provider Follow-up: none  Report Issues: By using [facility], or by calling the Heart Transplant office at [phone]. For urgent issues or after business hours (after 5pm on weekdays and anytime on weekends), call the [facility] Operator [phone] and ask to page the on-call transplant cardiologist.     Allergies/Intolerances:  Allergies  Allergen Reactions  .  Cantaloupe Itching and Other (See Comments)    Mouth itching  Mouth itching  Per patient she has no food or drug allergies 02/06/2024  . Nsaids (Non-Steroidal Anti-Inflammatory Drug) Other (See Comments)    Avoid post-transplant. OK for aspirin  81 mg daily.      Medications:     Discharge Medications     PAUSE taking these medications      Details  mycophenolate 180 MG DR tablet Wait to take this until your doctor or other care provider tells you to start again. Commonly known as: MYFORTIC  360 mg, Oral, Every 12 hours Quantity: 120 tablet Refills: 11       New Medications      Details  LORazepam  0.5 MG tablet Commonly known as: ATIVAN   1 mg, Oral, Every 6 hours PRN Quantity: 20 tablet Refills: 0       Modified Medications      Details  ondansetron  4 MG tablet Commonly known as: ZOFRAN   8 mg, Oral, Every 8 hours PRN Quantity: 100 tablet Refills: 11   pantoprazole   20 MG DR tablet Commonly known as: PROTONIX  What changed: how much to take  40 mg, Oral, Daily Quantity: 30 tablet Refills: 0   promethazine  25 MG tablet Commonly known as: PHENERGAN  What changed:  medication strength how much to take when to take this reasons to take this  25 mg, Oral, Every 4 hours PRN Quantity: 30 tablet Refills: 0       Medications To Continue      Details  ACCU-CHEK GUIDE ME GLUCOSE MTR Misc Generic drug: blood-glucose meter  Use as directed Quantity: 1 each Refills: 0   ACCU-CHEK GUIDE TEST STRIPS test strip Generic drug: blood glucose diagnostic  Use 1 test strip 3 (three) times daily Quantity: 100 each Refills: 3   ACCU-CHEK SOFTCLIX LANCETS lancets Generic drug: lancets  1 each, Misc.(Non-Drug; Combo Route), 3 times Daily Quantity: 100 each Refills: 3   acetaminophen  325 MG tablet Commonly known as: TYLENOL   Take 2 tablets (650 mg total) by mouth every 6 (six) hours as needed for Pain (Do not exceed 3000mg  in 24 hours) Quantity: 240  tablet Refills: 1   ACID REDUCER (FAMOTIDINE ) 10 mg tablet Generic drug: famotidine   10 mg, Oral, 2 times Daily Quantity: 60 tablet Refills: 2   aspirin  81 MG EC tablet  81 mg, Oral, Daily Quantity: 30 tablet Refills: 11   calcium  carbonate-vitamin D3 600 mg-10 mcg (400 unit) tablet Commonly known as: CALTRATE 600+D  1 tablet, Oral, 2 times Daily with meals Quantity: 60 tablet Refills: 11   cholecalciferol 1,250 mcg (50,000 unit) capsule Commonly known as: VITAMIN D3 Start taking on: July 17, 2024  50,000 Units, Oral, Weekly, --last dose:  08/07/24 Quantity: 4 capsule Refills: 0   DEXCOM G6 RECEIVER Misc  Refills: 0   DEXCOM G7 SENSOR Devi  APPLY 1 SENSOR TO SKIN EVERY 10 DAYS Refills: 0   escitalopram  oxalate 10 MG tablet Commonly known as: LEXAPRO   Take 1 tablet (10 mg total) by mouth once daily Quantity: 30 tablet Refills: 2   * HumaLOG  KwikPen Insulin  pen injector (concentration 100 units/mL) Generic drug: insulin  LISPRO  INJECT 50 UNITS SUBCUTANEOUSLY DAILY Quantity: 45 mL Refills: 11   * insulin  LISPRO pen injector (concentration 100 units/mL) Commonly known as: HumaLOG  KwikPen Insulin   This is for OFF PUMP PLAN ONLY- in event of pump failure or malfunction, inject 1 unit for every 6 grams of carbohydrates consumed immediately after eating three times daily with meals. Maximum total daily dose 50 units. Quantity: 15 mL Refills: 3   HYDROmorphone  2 MG tablet Commonly known as: DILAUDID   Take 1 tablet (2 mg total) by mouth every 8 (eight) hours as needed for moderate-to-severe pain for up to 7 days Quantity: 20 tablet Refills: 0   hydrOXYzine  pamoate 25 MG capsule Commonly known as: VISTARIL   25 mg, Oral, Every 6 hours PRN Quantity: 120 capsule Refills: 1   LANTUS  SOLOSTAR U-100 INSULIN  pen injector (concentration 100 units/mL) Generic drug: insulin  GLARGINE  This is for OFF PUMP PLAN ONLY- in event of insulin  pump malfunction or failure,  inject 16 units glargine subcutaneously once daily. Quantity: 15 mL Refills: 3   OLANZapine 2.5 MG tablet Commonly known as: ZyPREXA  2.5 mg, Oral, Nightly Quantity: 30 tablet Refills: 3   OMNIPOD 5 G6-G7 PODS (GEN 5) Crtg Generic drug: insulin  pump cart,auto,BT,G6/7  USE AS DIRECTED EVERY 72 HOURS Refills: 0   PHOSPHA 250 NEUTRAL 250 mg tablet Generic drug: sodium phosphate -potassium phosphate  2 tablets, Oral, 2 times Daily  at lunch and dinner Quantity: 120 tablet Refills: 1   predniSONE 5 MG tablet Commonly known as: DELTASONE  15 mg, Oral, Daily Quantity: 90 tablet Refills: 11   rosuvastatin  10 MG tablet Commonly known as: CRESTOR   10 mg, Oral, Daily Quantity: 30 tablet Refills: 11   SENEXON-S 8.6-50 mg tablet Generic drug: sennosides-docusate  1-2 tablets, Oral, 2 times Daily PRN Quantity: 60 tablet Refills: 2   sulfamethoxazole-trimethoprim 400-80 mg tablet Commonly known as: BACTRIM SS  80 mg of trimethoprim, Oral, Daily Quantity: 30 tablet Refills: 11   tacrolimus 1 MG capsule Commonly known as: PROGRAF  4 mg, Oral, Every 12 hours Quantity: 240 capsule Refills: 11   ULTRA-FINE PEN NEEDLE 31 gauge x 3/16 needle Generic drug: pen needle, diabetic  Misc.(Non-Drug; Combo Route), As Directed Quantity: 100 each Refills: 3   valGANciclovir 450 mg tablet Commonly known as: VALCYTE  900 mg, Oral, Daily Quantity: 176 tablet Refills: 0      * * There are duplicate medications prescribed to the patient             Brief History of Present Illness: Per the H&P dated on 07/15/2024: Kathy Lewis is a 28 y.o. 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 Notably, Kathy Lewis was just admitted from 10/19-11/4/25 and underwent OHT on 10/20. Post-op course was complicated by thrombocytopenia which  resolved, GI symptoms and bilateral IJ thrombi (AC was deferred due to frequent endomyocardial biospsies. She was started on pepcid , a PPI, zofran  and phenergan  for nausea/vomiting and switched to MWF Myfortic 2/2 N/V. She was discharged on 11/14.   She presented to an OSH ED on 11/16 with N/V and difficulty taking medications. She also had a sharp chest pain radiating to her back that improved with Morphine . The ED spoke with the transplant team at that time who recommended holding Myfortic and trial of PO. Patient was feeling betters so she discharged home and followed up in transplant clinic on 11/17 for her first outpatient visit since transplant. She had an echo and endomyocardial biopsy on 11/17. Echo was without evidence of pericardial effusion and biopsy without evidence of acute rejection. She then presented to kidney transplant clinic on 11/18 with reported chest pain radiating to her back with ongoing nausea. VSS and EKG without ischemic changes. Additionally POCUS with preserved biventricular function and minimal pericardial effusion. Recommended to trial supportive care for noncardiac chest pain with Tylenol , lidocaine  patches and heating packs.   Patient presents today as a transfer from [facility] after presenting there with right sided chest pain and nausea/vomiting. Patient reports that she has continued to have nausea almost every day since discharge.  She reports that the nausea is intermittent and not always associated with vomiting, but sometimes vomits multiple times a day.  For example she reports that on Monday she had a little bit of nausea in the morning but it was gone by the afternoon.  She is unable to discern any provoking or alleviating factors. The nausea is not associated with eating or taking medications. She did not have any associated vomiting on Monday but reports that yesterday on 11/18 she had nausea most of the day with multiple episodes of vomiting during her  clinic visits.  She reports that her p.o. intake has been minimal but she has been able to continue with p.o. intake  and tries to drink a boost when she takes her meds.  She reports that Zofran , Phenergan  and Compazine  have not been helpful for her nausea and that Tigan  was only helpful the 1st and 2nd time she got it last admission, but was not helpful after that.  She reports that she got Dilaudid  at the emergency room earlier in the evening which is the only thing that fully alleviated her nausea.  She notes that she has had a significant history of nausea predating her transplant and has experienced it intermittently over the past 2 years, but it has been more persistent since her transplant.   She also reports that yesterday she started to have right sided chest pain that she describes as a mix of pressure and heartburn in the right side of her chest.  She reports that she had also had this prior to her transplant but had not had this sensation of chest pain since her transplant until yesterday.  She reports that the chest pain comes and goes and also felt that, similar to her nausea, it was fully alleviated with Dilaudid .  She reports compliance with her medications and follows the list of medications from her discharge summary to guide her in taking them every morning.  She stopped taking Myfortic on Sunday after the ER physician reached out to the transplant team. She denies any fever/chills, diarrhea or other infectious symptoms and denies abdominal pain.  _____________________  Hospital Course by Problem:  # Acute on chronic atypical chest pain, resolved   # Acute on chronic nausea and vomiting, resolved # GAD Chest pain, nausea and vomiting pre-dated TXP, now presents with acute on chronic symptoms that have required to ER visits since index discharge 11/14. Previously no improvement in symptoms with metoclopramide  and prior gastric empty study 01/2024 normal. Symptoms could be related to  variable blood sugars given malfunction dexcom. Unclear if chest pain is related to anxiety vs GI, but does not appear to be cardiac given extensive work-up. Would recommend GI consult for EGD and CT chest if symptoms are recurrent/persistent. Of note, unable to tolerate nystatin due to frequent nausea. Symptoms improved with PO ativan  1mg , though limited evaluation given patient's request to discharge. Discharged with PRN ativan  1mg  q6hrs, increased phenegran, increased zofran , and increased protonix . Continued zyprex, lexapro , and PRN hydroxyzine . No Buspar due to worsening nausea when trialed inpatient. Follow up with Psych outpatient for further management.    # NICM s/p OHT 06/16/24 (Thymo induction. Negative T&B cell crossmatch) # Chronic immunosuppression  Recent discharge from index hospitalization 11/14 now present with acute on chronic right sided chest pain, nausea, and vomiting that pre-dated transplant. Able to tolerate IS medications. Only 1 missed tacrolimus dose while in the OSH ED. OSH Hstnl 71->76. ECG stable. CMV <260 11/17. TTE 11/17, POCUS 11/18, and limited TTE 11/19 with normal biventricular function. No history of rejection, last EMBX 11/17 0R, pAMR 0. Appeared euvolemic, did not require diuresis. Plan to resume myfortic 180mg  q12hr next week given no improvement of symptoms with holding myfortic since Sunday 11/16. Continue tacrolimus and prednisone. Bi-weekly TTE + RHC with EMBX 3+1 next 12/2. Of note, will need L femoral access given kidney transplant on the R and bilateral IJ thrombi.   # Prophylaxis  Continue aspirin , rosuvastatin , calcium -vit D. PCP/Toxo (low risk toxo status): Bactrim x1 year (end 06/16/2025). CMV: intermediate risk but received ATG induction. Received ganciclovir inpatient and transitioned to Valcyte x6 months (end 12/15/2024).     # ESRD  s/p DDKT 06/16/2024 # Ureteral stent in place # Abdominal JP drain in place  # Hypophosphatemia # Abnormal  PTH Immunosuppression/prophylaxis as above. JP drain remains in place. Ureteral stent to be removed outpatient in ~6 weeks (~12/1). Follow up in ABD TXP clinic 11/25.    # Vitamin D  deficiency Cholecalciferol 50000 units weekly x8 (end 12/25).    # Type I DM A1c 6.9% 05/2024.  Endocrine followed and assisted with insulin  pump.   # Bilateral IJ thrombus  Deferred systemic AC at this time given frequent biopsies, will consider outpatient once less frequent.   # Post-operative pain  PRN Tylenol  and PRN Dilaudid .      Social Drivers of Health with Concerns   Tobacco Use: Medium Risk (07/15/2024)   Patient History   . Smoking Tobacco Use: Former   . Smokeless Tobacco Use: Never  Physical Activity: Insufficiently Active (02/06/2024)   Received from [facility]   Exercise Vital Sign   . On average, how many days per week do you engage in moderate to strenuous exercise (like a brisk walk)?: 3 days   . On average, how many minutes do you engage in exercise at this level?: 30 min  Utilities: Not At Risk (07/16/2024)   [facility] Utilities   . Threatened with loss of utilities: No  Recent Concern: Utilities - At Risk (06/20/2024)   [facility] Utilities   . Threatened with loss of utilities: Yes   Comorbid Conditions: Electrolyte Disorders:    Hypomagnesemia:  Hypomagnesemia present with lowest magnesium  of 1.7.   Will continue to monitor.   Hematologic Disorders:    Anemia:  Anemia present with lowest hemoglobin of 10.8.  Will continue to monitor.           Imaging and Procedures Performed:   Limited TTE 07/16/24 CONCLUSION ------------------------------------------------------------------------------- NORMAL LEFT VENTRICULAR SYSTOLIC FUNCTION WITH MILD LVH ESTIMATED EF: >55% DIASTOLIC FUNCTION NOT ASSESSED NORMAL RIGHT VENTRICULAR SYSTOLIC FUNCTION NO DOPPLER PERFORMED FOR VALVULAR REGURGITATION                                            VALVULAR STENOSIS: No MS, No TS                                                              WITH MINIMAL RESPIRATORY VARIATION   Compared with prior Echo study on 07/14/2024 NO SIGNIFICANT CHANGES   _____________________  Discharge Exam:  Admission Weight: 57.2 kg (126 lb)  Discharge Weight: Weight: 57.2 kg (126 lb) BMI: Body mass index is 24.61 kg/m. BP 104/77   Pulse 89   Temp 36.8 C (98.3 F)   Resp 16   Ht 152.4 cm (5')   Wt 57.2 kg (126 lb)   LMP  (LMP Unknown)   SpO2 100%   BMI 24.61 kg/m   General: alert, cooperative, and in NAD Respiratory: regular rate, symmetric, unlabored, clear to auscultation bilaterally, and no accessory muscle use Cardiac: regular rate, regular rhythm, S1, S2 present, no murmur, no rub, no gallop, and JVD non-elevated Abdomen: normal bowel sounds, soft, nontender, and nondistended, JP drain  Extremities: extremities warm and well perfused, no clubbing or cyanosis, no edema, and  distal pulses intact Lines: none  Pertinent Lab Testing:  BMP: Recent Labs  Lab 07/16/24 0638  NA 143  K 4.5  CL 111*  CO2 22  BUN 12  CREATININE 1.1*  GLUCOSE 41*  CALCIUM  8.7  MG 1.7*   CBC: Recent Labs  Lab 07/16/24 0638  WBC 5.1  HGB 10.8*  HCT 35.4  PLT 305   INR: No results for input(s): INR in the last 168 hours.  FK: Recent Labs  Lab 07/14/24 0759  FK506 11.9   CYA: No results for input(s): CYA in the last 73719 hours. CMV: Recent Labs  Lab 07/14/24 0759  CMVDNA DETECTED*  CMVLOG <=2.4*  CMVCOPY <=260*    Cholesterol: No results for input(s): CHOLTOTAL, LDLCALC, HDL, TRIG in the last 168 hours.      Other Pertinent Labs: None  _____________________  Time spent on discharge process: >30 minutes  Kathy MIZOK BOSTIAN, PA  ------------------------------------------------------------------------------- Attestation signed by Consuela Lonni Ruth, MD at 07/16/2024 10:57 PM Attestation Statement:   I personally saw the patient and performed a substantive  portion of the medical decision making, in conjunction with the Advanced Practice Provider for the condition/treatment of OHT, DDKT, immunosuppression, acute on chronic N/V, T1D on insulin  pump, bilat IJ thrombus but not on AC.  Correction to the text above: plan is to resume Myfortic @ d/c, not waiting until next week; I have asked txp coordinator Gerard Hummer to call pt to clarify this.   CHRISTOPHER LEE HOLLEY, MD  ------------------------------------------------------------------------------- *Some images could not be shown.

## 2024-07-16 NOTE — Progress Notes (Signed)
 Virtual Nurse Discharge Note  DC instructions/AVS reviewed with: Patient and Family Patient is alert, interactive and appropriate; States and appears to be comfortable.  No complaints.  Patient still inside the 24 hour anesthesia window and unable to sign AVS: No Patient received a paper copy of AVS:Yes   Pt has active Mychart account: Yes  Questions answered; patient/family actively and appropriately engaged in discussion; able to verbalize understanding of instructions and rationale on AVS.   Care nurse notified of completion of discharge paperwork, instructions and education: Yes  VRN requested transport for patient: No   Interpreter needed for session?No  Appointment scheduled for follow up? yes  Specific/Additional Instructions or comment: message sent by VRN to MD to clarify d/c med regimen for PRN Promethazine . Bedside RN aware and will follow up with patient prior to discharge. Patient in agreement and will await further clarification.

## 2024-07-29 NOTE — Progress Notes (Signed)
 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   Unknown - Present    Overview Signed 07/03/2024 12:00 PM by Levander Mems, RN  per chart review previously on Eliquis  for right IJ DVT      Nausea R11.0   06/25/2024 - Present    S/P orthotopic heart transplant (CMS/HHS-HCC) Z94.1   06/17/2024 - Present    Postoperative anemia D64.9   06/17/2024 - Present    Kidney transplanted (HHS-HCC) Z94.0   06/16/2024 - Present    Immunosuppression (HHS-HCC) D84.9   06/16/2024 - Present    Pre-transplant evaluation for kidney transplant Z01.818   04/04/2024 - Present    Type 1 diabetes mellitus (CMS/HHS-HCC) (Chronic) E10.9   01/16/2012 - Present    Overview Signed 01/16/2012  8:25 AM by Ishmael Ozell RAMAN, MD  On set August, 2005, seropositive   Hospitalized For DKA August, 2011       Hypercholesterolemia E78.00   01/16/2012 - Present    Overview Signed 01/16/2012  8:26 AM by Ishmael Ozell RAMAN, MD  Cholesterol 234, LDL 129 , triglycerides 225 , HDL 61 On October 10, 2011 (Hemoglobin A1c Greater than 14)       RESOLVED: Ileus (CMS/HHS-HCC) K56.7   07/02/2024 - 07/07/2024    RESOLVED: Encounter for monitoring diuretic therapy Z51.81, Z79.899   06/18/2024 - 06/25/2024    RESOLVED: Thrombocytopenia () D69.6   06/18/2024 - 07/07/2024    RESOLVED: Receiving inotropic medication Z79.899   06/17/2024 - 06/25/2024    RESOLVED: Dilated idiopathic cardiomyopathy (CMS/HHS-HCC) I42.0   06/16/2024 - 07/07/2024    RESOLVED: Prophylactic antibiotic  Z79.2   06/16/2024 - 07/07/2024    RESOLVED: NICM (nonischemic cardiomyopathy) (CMS/HHS-HCC) I42.8   Unknown - 07/07/2024    RESOLVED: Chronic systolic CHF (congestive heart failure) (CMS/HHS-HCC) I50.22   12/28/2023 - 07/07/2024    RESOLVED: ESRD on dialysis (CMS/HHS-HCC) N18.6, Z99.2   2022 - 07/07/2024    Kathy Lewis is a 28 y.o. female with a history of simultaneous heart and kidney transplant on 06/16/24  Seen in transplant clinic 11/17 as first outpatient visit since heart/kidney. At that visit she went to the cath lab for biopsy - 0R, pAMR 0. Represented to clinic 11/18 for chest pain after biopsy with stable vitals, EKG without ischemia, cardiac POCUS with preserved function and minimal pericardial effusion. Subsequently went to OSH later that night and transferred to [facility] with ongoing n/v, chest pain. Improved with PO ativan  x 1. Increased phenergan , zofran , and protonix . Discharged with ativan  1mg  x5 days for symptoms.   Has struggled with nausea and poor intake since transplant with 2 ED presentations with these complaints since index discharge. She held her Myfortic for a period of time and then resumed 180mg  q12h which was further uptitrated to 360mg  q12h about a week ago. Ms. Kilbride reports improvement in her nausea since her last presentation. States that she has been taking 2 phenergan  tablets (50mg  total) in the morning  which keep things relatively steady throughout the day although still not eating great. Continues Zyprexa, not using Zofran .   Continues to make some progress with her rehab. Has some chest soreness which she attributes to ongoing healing. Denies shortness of breath, abdominal pain, n/v, dizziness, palpitations.  Planned biopsy in cath lab today but unfortunately patient did not bring a driver and thus must be rescheduled.  The 12-point ROS was otherwise unremarkable except for the issues noted above.  Current Outpatient Medications  Medication Sig Dispense  Refill  . acetaminophen  (TYLENOL ) 325 MG tablet Take 2 tablets (650 mg total) by mouth every 6 (six) hours as needed for Pain (Do not exceed 3000mg  in 24 hours) 240 tablet 1  . aspirin  81 MG EC tablet Take 1 tablet (81 mg total) by mouth once daily 30 tablet 11  . blood glucose diagnostic test strip Use 1 test strip 3 (three) times daily 100 each 3  . blood glucose meter kit Use as directed 1 each 0  . blood-glucose sensor (DEXCOM G7 SENSOR) Devi APPLY 1 SENSOR TO SKIN EVERY 10 DAYS    . calcium  carbonate-vitamin D3 (CALTRATE 600+D) 600 mg-10 mcg (400 unit) tablet Take 1 tablet by mouth 2 (two) times daily with meals 60 tablet 11  . cholecalciferol (VITAMIN D3) 1,250 mcg (50,000 unit) capsule Take 1 capsule (50,000 Units total) by mouth once a week --last dose:  08/07/24 4 capsule 0  . escitalopram  oxalate (LEXAPRO ) 10 MG tablet Take 1 tablet (10 mg total) by mouth once daily 30 tablet 2  . famotidine  (PEPCID ) 10 MG tablet Take 1 tablet (10 mg total) by mouth 2 (two) times daily 60 tablet 2  . HUMALOG  KWIKPEN 100 unit/mL pen injector INJECT 50 UNITS SUBCUTANEOUSLY DAILY 45 mL 11  . hydrOXYzine  pamoate (VISTARIL ) 25 MG capsule Take 1 capsule (25 mg total) by mouth every 6 (six) hours as needed for Anxiety 120 capsule 1  . insulin  GLARGINE (LANTUS  SOLOSTAR) pen injector (concentration 100 units/mL) This is for OFF PUMP PLAN ONLY- in event of insulin  pump malfunction or failure, inject 16 units glargine subcutaneously once daily. 15 mL 3  . insulin  LISPRO (HUMALOG  KWIKPEN INSULIN ) pen injector (concentration 100 units/mL) This is for OFF PUMP PLAN ONLY- in event of pump failure or malfunction, inject 1 unit for every 6 grams of carbohydrates consumed immediately after eating three times daily with meals. Maximum total daily dose 50 units. 15 mL 3  . lancets Use 1 each 3 (three) times daily 100 each 3  . mycophenolate (MYFORTIC) 180 MG DR tablet Take 2 tablets (360 mg total) by mouth every 12 (twelve)  hours 120 tablet 11  . OLANZapine (ZYPREXA) 2.5 MG tablet Take 1 tablet (2.5 mg total) by mouth at bedtime 30 tablet 3  . OMNIPOD 5 G6-G7 PODS, GEN 5, Crtg USE AS DIRECTED EVERY 72 HOURS    . ondansetron  (ZOFRAN ) 4 MG tablet Take 2 tablets (8 mg total) by mouth 3 (three) times daily 180 tablet 11  . pantoprazole  (PROTONIX ) 20 MG DR tablet Take 2 tablets (40 mg total) by mouth once daily 30 tablet 0  . pen needle, diabetic 31 gauge x 3/16 needle Use as directed 100 each 3  . predniSONE (DELTASONE) 5 MG tablet Take 3 tablets (15 mg total) by mouth once daily 90 tablet 11  . promethazine  (PHENERGAN ) 25 MG tablet Take 1 tablet (25 mg total) by mouth every 4 (four) hours as needed for Nausea or Vomiting 30  tablet 0  . rosuvastatin  (CRESTOR ) 10 MG tablet Take 1 tablet (10 mg total) by mouth once daily 30 tablet 11  . sennosides-docusate (SENOKOT-S) 8.6-50 mg tablet Take 1-2 tablets by mouth 2 (two) times daily as needed for Constipation 60 tablet 2  . sodium phosphate -potassium phosphate (K-PHOS NEUTRAL) 250 mg tablet Take 2 tablets by mouth 2 (two) times daily at lunch and dinner 120 tablet 1  . sulfamethoxazole-trimethoprim (BACTRIM SS) 400-80 mg tablet Take 1 tablet (80 mg of trimethoprim total) by mouth once daily 30 tablet 11  . tacrolimus (PROGRAF) 1 MG capsule Take 4 capsules (4 mg total) by mouth every 12 (twelve) hours 240 capsule 11  . valGANciclovir (VALCYTE) 450 mg tablet Take 2 tablets (900 mg total) by mouth once daily 176 tablet 0  . DEXCOM G6 RECEIVER Misc  (Patient not taking: Reported on 07/22/2024)     No current facility-administered medications for this visit.   Social History: She lives in [place]  Physical Exam: BP (!) 136/94 (BP Location: Left upper arm, Patient Position: Sitting, BP Cuff Size: Large Adult)   Pulse 90   Temp 36.8 C (98.3 F) (Oral)   Resp 20   Ht 152.4 cm (5')   Wt 65.8 kg (145 lb 1 oz)   LMP  (LMP Unknown) Comment: patient is on dialysis  SpO2 100%  Comment: on room air  BMI 28.33 kg/m   Body mass index is 28.33 kg/m.  Patient is alert and oriented and in no apparent distress Oropharynx was moist and without lesions There was no cervical lymphadenopathy The lung fields were clear Jugular venous pulsations were at the collarbone when sitting upright at 90 degrees Cardiac exam reveals a regular rhythm with a normal first and second heart sound.  There were no gallops or murmurs Abdomen was soft, non-tender, non-distended, and with normal bowel sounds. JP drain remains RLQ with serosanguinous drainage  Extremities demonstrated no edema The neurological examination was grossly non-focal  Impression: 28 y.o. female status post recent simultaneous heart and kidney transplant here for routine evaluation.   # Post heart transplant  - Rescheduling biopsy and TTE to later this week when driver is available.  - FK506 11.0 (goal 8-12) - no changes  - HLA 11/25 with significant increase in class I antibodies c/w prior with new DSA B37 with low MFI at 714. Pt had been off Myfortic for a period of time which may account for this change. She has now resumed as of a few days ago at 360mg  q12h but on very large dose of phenergan  and ongoing poor intake. Defer Myfortic increase at this time to ensure tolerance but plan to consider trial of 720mg  next visit pending symptoms  - On pred 15 - if next biopsy/TTE reassuring can wean to 12.5mg  - Cont Bactrim/Valcyte  - renal transplant team requesting avoiding R femoral access for future RHC/biopsies   # DM1 BG elevated on labs  - Followup with endo  # s/p kidney transplant - planning peritoneal window for ongoing high output at JP drain - planning stent removal 12/4  # Nausea, improved Recommending max dose of phenergan  to be 25mg  at a time. Currently taking 50mg  intermittently.   I spent 35 minutes in face-to-face and non-face-to-face activities for patient during this encounter.   Attestation  Statement:   I personally performed the service, non-incident to. (WP)   COURTNEY KEENE SALT, NP

## 2024-07-29 NOTE — Progress Notes (Signed)
 Patient's blood pressure was elevated   Vitals:   07/29/24 0952 07/29/24 0957  BP: (!) 129/93 (!) 129/90  BP Location: Right upper arm Right upper arm  Patient Position: Sitting Sitting  BP Cuff Size: Adult Adult  Pulse: 93 93  Resp: 16   Temp: 36.8 C (98.2 F)   TempSrc: Oral   SpO2: 99%   Weight: 65.8 kg (145 lb 1 oz)     I have notified Pleasant Pepper (the head charge nurse) and Dr. Charlanne, Ileana (the provider) of the patient's condition. The patient stats they have no symptoms and doesn't take BP medication.

## 2024-07-30 NOTE — Unmapped External Note (Signed)
 Medication instructions prior to procedure:  Outpatient Medications Marked as Taking for the 07/30/24 encounter (Pre-Admission Testing) with Jonette Ronal Jenkins Downer, NP  Medication Sig INSTRUCTIONS FOR DAY OF SURGERY   . acetaminophen  (TYLENOL ) 325 MG tablet Take 2 tablets (650 mg total) by mouth every 6 (six) hours as needed for Pain (Do not exceed 3000mg  in 24 hours) TAKE   . aspirin  81 MG EC tablet Take 1 tablet (81 mg total) by mouth once daily TAKE   . blood glucose diagnostic test strip Use 1 test strip 3 (three) times daily   . blood glucose meter kit Use as directed   . blood-glucose sensor (DEXCOM G7 SENSOR) Devi APPLY 1 SENSOR TO SKIN EVERY 10 DAYS   . calcium  carbonate-vitamin D3 (CALTRATE 600+D) 600 mg-10 mcg (400 unit) tablet Take 1 tablet by mouth 2 (two) times daily with meals   . cholecalciferol (VITAMIN D3) 1,250 mcg (50,000 unit) capsule Take 1 capsule (50,000 Units total) by mouth once a week --last dose:  08/07/24 (Patient taking differently: Take 50,000 Units by mouth once a week --last dose:  08/07/24 Takes on Thursday)   . DEXCOM G6 RECEIVER Misc    . escitalopram  oxalate (LEXAPRO ) 10 MG tablet Take 1 tablet (10 mg total) by mouth once daily TAKE   . famotidine  (PEPCID ) 10 MG tablet Take 1 tablet (10 mg total) by mouth 2 (two) times daily TAKE   . HUMALOG  KWIKPEN 100 unit/mL pen injector INJECT 50 UNITS SUBCUTANEOUSLY DAILY Fasting glucose goal for arrival to preop is 150   . hydrOXYzine  pamoate (VISTARIL ) 25 MG capsule Take 1 capsule (25 mg total) by mouth every 6 (six) hours as needed for Anxiety TAKE   . insulin  GLARGINE (LANTUS  SOLOSTAR) pen injector (concentration 100 units/mL) This is for OFF PUMP PLAN ONLY- in event of insulin  pump malfunction or failure, inject 16 units glargine subcutaneously once daily.   . insulin  LISPRO (HUMALOG  KWIKPEN INSULIN ) pen injector (concentration 100 units/mL) This is for OFF PUMP PLAN ONLY- in event of pump failure or malfunction,  inject 1 unit for every 6 grams of carbohydrates consumed immediately after eating three times daily with meals. Maximum total daily dose 50 units.   SABRA lancets Use 1 each 3 (three) times daily   . mycophenolate (MYFORTIC) 180 MG DR tablet Take 2 tablets (360 mg total) by mouth every 12 (twelve) hours TAKE   . OLANZapine (ZYPREXA) 2.5 MG tablet Take 1 tablet (2.5 mg total) by mouth at bedtime   . OMNIPOD 5 G6-G7 PODS, GEN 5, Crtg USE AS DIRECTED EVERY 72 HOURS   . ondansetron  (ZOFRAN ) 4 MG tablet Take 2 tablets (8 mg total) by mouth 3 (three) times daily   . pantoprazole  (PROTONIX ) 20 MG DR tablet Take 2 tablets (40 mg total) by mouth once daily TAKE   . pen needle, diabetic 31 gauge x 3/16 needle Use as directed   . predniSONE (DELTASONE) 5 MG tablet Take 3 tablets (15 mg total) by mouth once daily   . promethazine  (PHENERGAN ) 25 MG tablet Take 1 tablet (25 mg total) by mouth every 4 (four) hours as needed for Nausea or Vomiting TAKE   . rosuvastatin  (CRESTOR ) 10 MG tablet Take 1 tablet (10 mg total) by mouth once daily TAKE   . sennosides-docusate (SENOKOT-S) 8.6-50 mg tablet Take 1-2 tablets by mouth 2 (two) times daily as needed for Constipation   . sodium phosphate -potassium phosphate (K-PHOS NEUTRAL) 250 mg tablet Take 2 tablets by mouth  2 (two) times daily at lunch and dinner   . sulfamethoxazole-trimethoprim (BACTRIM SS) 400-80 mg tablet Take 1 tablet (80 mg of trimethoprim total) by mouth once daily TAKE   . tacrolimus (PROGRAF) 1 MG capsule Take 4 capsules (4 mg total) by mouth every 12 (twelve) hours TAKE   . valGANciclovir (VALCYTE) 450 mg tablet Take 2 tablets (900 mg total) by mouth once daily (Patient taking differently: Take 900 mg by mouth once daily Takes dinnertime)

## 2024-07-30 NOTE — Pre-Procedure Assessment (Addendum)
 PASS-Perioperative Anesthesia & Surgical Screening Phone Screen interview ALERTS:       Insulin  pump --Omnipod wears on stomach   IJ clot (US  upper extremity 07/04/2024)   1.  Near occlusive thrombus in the right internal jugular and cephalic veins.  2.  Small nonocclusive thrombus vs fibrin sheath in the left internal jugular vein.  AC was deferred due to frequent endomyocardial biospsies   Procedure:   Date/Time: 08/04/2024   Procedure: Procedure(s): LAPAROSCOPY, SURGICAL; WITH DRAINAGE OF LYMPHOCELE TO PERITONEAL CAVITY   Location: [facility]  Surgeon(s): Charlanne Rounds, MD     CC/HPI  Kathy Lewis is a 28 y.o. female with recent heart and kidney transplant who was PASS evaluated prior to the above.     Vitals (36hrs):  Wt Readings from Last 3 Encounters:  07/29/24 65.8 kg (145 lb 1 oz)  07/29/24 65.8 kg (145 lb 1 oz)  07/22/24 63.1 kg (139 lb 1.8 oz)   Temp Readings from Last 3 Encounters:  07/29/24 36.8 C (98.2 F) (Oral)  07/29/24 36.8 C (98.3 F) (Oral)  07/22/24 36.6 C (97.9 F) (Oral)   BP Readings from Last 3 Encounters:  07/29/24 (!) 129/90  07/29/24 (!) 136/94  07/22/24 118/82   Pulse Readings from Last 3 Encounters:  07/29/24 93  07/29/24 90  07/22/24 96      Relevant Risk Scores: STOP BANG: 0 [facility] Activity Status Index (DASI): 34.7  Medical History                                    HEENT Denies TMJ     Pulmonary Form smoker  Can lie flat  Sleeps 1 pillow   Denies PE/lung surgery   Denies recent URI, COVID or RSV     Cardiovascular    Exercise tolerance: >4 METS (walks tid)   Antiplatelet therapy    Cardiomyopathy   Heart transplant: 05/2024 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; The patient was evaluated by the transplant team and has been active on the heart transplant list  EKG Normal sinus rhythm  Left atrial enlargement  Late precordial R/S transition  Nonspecific T wave  abnormalities  Markedly prolonged QT interval  Abnormal ECG   Denies CAD/active CP/SOB/Palptiations  Walks tid   Last ECHO 07/16/2024  Post heart transplant  NORMAL LEFT VENTRICULAR SYSTOLIC FUNCTION WITH MILD LVH ESTIMATED EF: >55% DIASTOLIC FUNCTION NOT ASSESSED NORMAL RIGHT VENTRICULAR SYSTOLIC FUNCTION NO DOPPLER PERFORMED FOR VALVULAR REGURGITATION                                            VALVULAR STENOSIS: No MS, No TS                                                             WITH MINIMAL RESPIRATORY VARIATION   Compared with prior Echo study on 07/14/2024 NO SIGNIFICANT CHANGES  IJ clot (US  upper extremity 07/04/2024)   1.  Near occlusive thrombus in the right internal jugular and cephalic veins.  2.  Small nonocclusive thrombus vs fibrin sheath in the left  internal jugular vein.  AC was deferred due to frequent endomyocardial biospsies     Gastrointestinal Wgt stable   BMI 28   GU/Renal   Kidney transplant:05/2024 ESRD on PD--PD cath was removed   Hematology/Oncology    Anemia (resolving post transplant):  negative for DVT   History of blood transfusion Normal as reviewed Rheumatology Normal as reviewed Endocrine   Diabetes, DM Type 1:  Type 1 DM  Since age 73  Omnipod insulin  pump   Musculoskeletal Normal as reviewed.  Pain Denies pain Skin  Surgical incisions are healing well  GYN    LMP while was on ESRD  No LMP recorded (lmp unknown). (Menstrual status: Other). Infectious Disease Normal as reviewed. Psych  Anxiety  Bipolar disorder    Review of Systems   A comprehensive 10-system ROS was asked and was negative except for the following:  HEENT: contacts/glasses Pulm: no cough, no hemoptysis, no wheezing, no sputumCardiac: no chest pressure, no claudication, no cardiac dyspnea, no fatigue, no irregular heartbeat, leg swelling, no orthopnea, no PND, no syncopeGI: nausea/vomiting (morning nausea --phenergan ) GI: .no  abdominal pain, no, no constipationGU/Renal: .no difficulty urinating, no frequent urination, no hematuria, no bladder incontinenceNeuro/Spine/CNS: .no dizziness, no gait problem, no memory difficulty, no tremors, no vertigo, no weakness, no paraparesis  Past Surgical History   Past Surgical History:  Procedure Laterality Date   CARDIAC CATHETERIZATION  N/A   hx heart and kidney biopsies     INSERTION PERITONEAL CANNULA/CATH FOR DIALYSIS     OMENTECTOMY     PD catheter revision with partial omentectomy   PERITONEAL CATHETER REMOVAL     permacath insertion  08/2022   TRANSPLANT HEART N/A 06/15/2024   Procedure: ADULT, HEART TRANSPLANT, WITH OR WITHOUT RECIPIENT CARDIECTOMY;  Surgeon: Virgia Korene Raman, MD;  Location: [facility];  Service: Cardiothoracic;  Laterality: N/A;   TRANSPLANT KIDNEY N/A 06/16/2024   Procedure: ADULT, RENAL ALLOTRANSPLANTATION, IMPLANTATION OF GRAFT; WITHOUT RECIPIENT NEPHRECTOMY;  Surgeon: Charlanne Rounds, MD;  Location: [facility];  Service: General Surgery;  Laterality: N/A;    Social/Family History   Social History   Occupational History   Not on file  Tobacco Use   Smoking status: Former    Current packs/day: 0.00    Average packs/day: 0.3 packs/day for 3.0 years (0.8 ttl pk-yrs)    Types: Cigarettes    Start date: 12/04/2015    Quit date: 12/04/2018    Years since quitting: 5.6   Smokeless tobacco: Never  Vaping Use   Vaping status: Never Used  Substance and Sexual Activity   Alcohol use: Never   Drug use: Not Currently    Types: Benzodiazepines    Comment: Rx'd Klonopin  prn   Sexual activity: Not Currently    Comment: LMP- last 2022   Vaping/E-Cigarettes   Vaping/E-Cigarette Use Never User    Start Date     Cartridges/Day     Quit Date     Family History  Adopted: Yes  Problem Relation Age of Onset   Ovarian cancer Mother    Heart disease Father    Chronic kidney disease Father    No Known  Problems Sister    Allergies   Allergies  Allergen Reactions   Cantaloupe Itching and Other (See Comments)    Mouth itching  Mouth itching  Per patient she has no food or drug allergies 02/06/2024   Nsaids (Non-Steroidal Anti-Inflammatory Drug) Other (See Comments)    Avoid post-transplant. OK for aspirin  81 mg  daily.    Medications   Current Outpatient Medications  Medication   mycophenolate (MYFORTIC) 180 MG DR tablet   ondansetron  (ZOFRAN ) 4 MG tablet   pantoprazole  (PROTONIX ) 20 MG DR tablet   promethazine  (PHENERGAN ) 25 MG tablet   blood-glucose sensor (DEXCOM G7 SENSOR) Devi   OMNIPOD 5 G6-G7 PODS, GEN 5, Crtg   OLANZapine (ZYPREXA) 2.5 MG tablet   tacrolimus (PROGRAF) 1 MG capsule   predniSONE (DELTASONE) 5 MG tablet   famotidine  (PEPCID ) 10 MG tablet   escitalopram  oxalate (LEXAPRO ) 10 MG tablet   cholecalciferol (VITAMIN D3) 1,250 mcg (50,000 unit) capsule   sodium phosphate -potassium phosphate (K-PHOS NEUTRAL) 250 mg tablet   hydrOXYzine  pamoate (VISTARIL ) 25 MG capsule   acetaminophen  (TYLENOL ) 325 MG tablet   blood glucose meter kit   blood glucose diagnostic test strip   lancets   pen needle, diabetic 31 gauge x 3/16 needle   insulin  GLARGINE (LANTUS  SOLOSTAR) pen injector (concentration 100 units/mL)   insulin  LISPRO (HUMALOG  KWIKPEN INSULIN ) pen injector (concentration 100 units/mL)   sennosides-docusate (SENOKOT-S) 8.6-50 mg tablet   sulfamethoxazole-trimethoprim (BACTRIM SS) 400-80 mg tablet   valGANciclovir (VALCYTE) 450 mg tablet   aspirin  81 MG EC tablet   rosuvastatin  (CRESTOR ) 10 MG tablet   calcium  carbonate-vitamin D3 (CALTRATE 600+D) 600 mg-10 mcg (400 unit) tablet   DEXCOM G6 RECEIVER Misc   HUMALOG  KWIKPEN 100 unit/mL pen injector   No current facility-administered medications for this visit.   Physical Exam  Phys Exam Test Results    Lab Results  Component Value Date   WBC 5.1 07/29/2024   HGB 11.6  (L) 07/29/2024   HCT 38.1 07/29/2024   PLT 318 07/29/2024   ALT 22 07/29/2024   AST 30 07/29/2024   GLUCOSE 232 (H) 07/29/2024   NA 141 07/29/2024   K 5.0 07/29/2024   CL 109 (H) 07/29/2024   CALCIUM  8.6 (L) 07/29/2024   MG 1.6 (L) 07/29/2024   CREATININE 1.3 (H) 07/29/2024   BUN 17 07/29/2024   CO2 26 07/29/2024   ALB 3.1 (L) 07/29/2024   HGBA1C 6.9 (H) 06/15/2024   INR 1.0 06/17/2024   PT 11.1 06/17/2024   APTT 29.6 06/16/2024   VITD 10 (L) 07/02/2024     HGB  (Last 365 days)      Yesterday 0752 11/25 1223 11/19 0638   HGB           11.6*                  11.4*                  10.8*             Creat  (Last 365 days)      Yesterday 0752 11/25 1223 11/19 0638   Creat           1.3*                  1.2*                  1.1*             A1C  (Last 365 days)      10/19 2215 08/04 1234   A1C           6.9*                  7.0*     7.0*  Patient Instructions   Problem List   Patient Active Problem List  Diagnosis   Type 1 diabetes mellitus (CMS/HHS-HCC)   Hypercholesterolemia   Pre-transplant evaluation for kidney transplant   Kidney transplanted (HHS-HCC)   Immunosuppression (HHS-HCC)   S/P orthotopic heart transplant (CMS/HHS-HCC)   Postoperative anemia   Nausea   History of internal jugular thrombosis   Protein-calorie malnutrition, severe (HHS-HCC)   Vomiting   Hypophosphatemia   GAD (generalized anxiety disorder)   Prophylactic antibiotic   Assessment & Plan  ASA: 3 Anesthesia options discussed: General   Diagnoses and all orders for this visit:  Type 1 diabetes mellitus with other kidney complication (CMS/HHS-HCC)  Kidney transplanted (HHS-HCC)  S/P orthotopic heart transplant (CMS/HHS-HCC)  Postoperative anemia  Immunosuppression (HHS-HCC)  Nausea  Protein-calorie malnutrition, severe (HHS-HCC)  History of internal jugular thrombosis   PULMONARY RISK--Can  lie flat  Former smoker  DASI 34.7  Denies URI  Denies OSA stop bang is 0    CARDIAC RISK--Denies CAD/CP  NICM peripartum since 2021 with svere LV dysfxn --s/p HEART transplant and kidney done at the same time 05/[facility]24 at [facility]  Type 1 DM on insulin  pump since age 51  Last A1c will be pre heart transplant at 7.0 078/2025    Walks tid  DASI 34.7   Last ECHO 07/16/2024  Post heart transplant  NORMAL LEFT VENTRICULAR SYSTOLIC FUNCTION WITH MILD LVH ESTIMATED EF: >55% DIASTOLIC FUNCTION NOT ASSESSED NORMAL RIGHT VENTRICULAR SYSTOLIC FUNCTION NO DOPPLER PERFORMED FOR VALVULAR REGURGITATION                                            VALVULAR STENOSIS: No MS, No TS                                                             WITH MINIMAL RESPIRATORY VARIATION   Compared with prior Echo study on 07/14/2024 NO SIGNIFICANT CHANGES  IJ clot (US  upper extremity 07/04/2024)   1.  Near occlusive thrombus in the right internal jugular and cephalic veins.  2.  Small nonocclusive thrombus vs fibrin sheath in the left internal jugular vein.  AC was deferred due to frequent endomyocardial biospsies   Last EKG 07/15/2024  Sinus tachycardia  Left atrial enlargement  Right axis deviation  RSR' in V1 or V2  Nonspecific T wave abnormalities, anteroseptal leads  Low voltage throughout   REviewed medical chart, labs and cardiac testing.  Medical decision making:  Based on dASI, pulmonary status and cardiac testing pt is low moderate risk for procedure.  Can lie flat Denies OSA   Not on any OAC   Kathy Lewis ANP PASS clinic  07/30/2024  Day of Surgery  Day of Surgery Exam  *Some images could not be shown.

## 2024-08-06 NOTE — Progress Notes (Signed)
 Placing urine orders for 12/16 [facility] visit.  Future Appointments     Date/Time Provider Department Center Visit Type   08/11/2024  1:00 AM [facility] 1 [facility]ARDIAC [facility] ADULT   08/11/2024 7:40 AM (Arrive by 7:30 AM) LABS-46F2G [facility] 46F/2G Lab [facility] [place] LAB   08/11/2024 8:15 AM (Arrive by 7:45 AM) HEART TRANSPLANT PROVIDER [facility] RETURN CATH   08/11/2024 1:00 PM [facility] ECHO CATH [facility] ECHO COMPLETE   08/12/2024 10:45 AM (Arrive by 10:15 AM) Charlanne Rounds, MD [facility] RETURN VISIT   08/19/2024 1:30 PM (Arrive by 1:00 PM) RENAL TRANSPLANT PROVIDER 1 [facility] TRANSPLANT FOLLOW-UP   09/02/2024 11:00 AM (Arrive by 10:30 AM) [facility] No, PhD Trans[facility] Psychology Program [facility] THERAPY 60

## 2024-08-25 NOTE — Telephone Encounter (Addendum)
 No changes from Kidney team.  Spoke with Kathy Lewis, labs reviewed by Dr. Charlanne and Dr. Buell. Changes reviewed with pt, see message below. Pt verbalized understanding.   Increase IR Tac 4/4 (per heart team on 12/24). Labs on 12/30 w/ visit.  Next Labs: 12/30 w/ visit Next RTP1: 12/30  Future Appointments     Date/Time Provider Department Center Visit Type   08/26/2024 11:20 AM (Arrive by 10:50 AM) RENAL TRANSPLANT PROVIDER 1 [facility] TRANSPLANT FOLLOW-UP   09/01/2024 1:00 PM [facility], Kathy Lewis; [facility] IR K5 [facility] Neuro/VASC IR Madie Stagers IR PCN EXCHANGE   09/02/2024 11:00 AM (Arrive by 10:30 AM) [facility] No, PhD Transplant Psychology Program [facility] THERAPY 60   09/09/2024 1:00 AM [facility] CARDIAC CATH ADULT   09/09/2024 8:15 AM (Arrive by 7:45 AM) NURSE CLINIC 58F/2G [facility] NURSE VISIT   09/09/2024 8:40 AM (Arrive by 8:10 AM) LABS-[id number] [facility] 58F/2G Lab [facility] [place] LAB   09/09/2024 9:15 AM (Arrive by 8:45 AM) HEART TRANSPLANT PROVIDER [facility] RETURN CATH   09/09/2024 10:00 AM [facility] ECHO [facility]ATH HOLD[facility] COMPLETE      ----- Message from [facility] Kathy Lewis, Kathy Lewis sent at 08/22/2024 12:14 PM EST ----- Hi Kathy Lewis,  I agree with surgery.  Will defer tacrolimus adjustments to the cardiology team.  Potassium was hemolyzed so I think we can leave that alone for now and repeat labs in 1 week.  No changes to her medicines at this time  Thanks, Kathy Lewis  ----Kathy Lewis, Kathy Lewis    08/20/24  2:25 PM Note    Patient saw kidney team on 08/19/24.  Plan:  Increased dose to 4+4; labs on 12/30 ( day of kidney clinic appt).Patient notified of results and plan.

## 2024-09-12 ENCOUNTER — Telehealth (HOSPITAL_COMMUNITY): Payer: Self-pay

## 2024-09-12 NOTE — Telephone Encounter (Signed)
 Referral received from [facility] from Dr. Alvaro office.
